# Patient Record
Sex: Female | Born: 1940 | Race: Black or African American | Hispanic: No
Health system: Southern US, Academic
[De-identification: ages and names within clinical notes are randomized; demographics above are authoritative.]

## PROBLEM LIST (undated history)

## (undated) ENCOUNTER — Encounter

## (undated) ENCOUNTER — Telehealth

## (undated) ENCOUNTER — Ambulatory Visit: Payer: MEDICARE

## (undated) ENCOUNTER — Ambulatory Visit

## (undated) ENCOUNTER — Encounter: Attending: Community Health | Primary: Community Health

## (undated) ENCOUNTER — Telehealth: Attending: Clinical | Primary: Clinical

## (undated) ENCOUNTER — Ambulatory Visit: Attending: Audiologist | Primary: Audiologist

## (undated) ENCOUNTER — Ambulatory Visit: Payer: MEDICARE | Attending: Optometrist | Primary: Optometrist

## (undated) ENCOUNTER — Other Ambulatory Visit

## (undated) ENCOUNTER — Ambulatory Visit
Payer: MEDICARE | Attending: Student in an Organized Health Care Education/Training Program | Primary: Student in an Organized Health Care Education/Training Program

## (undated) ENCOUNTER — Ambulatory Visit: Payer: MEDICARE | Attending: Professional | Primary: Professional

## (undated) ENCOUNTER — Encounter
Attending: Student in an Organized Health Care Education/Training Program | Primary: Student in an Organized Health Care Education/Training Program

## (undated) ENCOUNTER — Ambulatory Visit: Payer: MEDICARE | Attending: Ambulatory Care | Primary: Ambulatory Care

## (undated) ENCOUNTER — Other Ambulatory Visit
Attending: Pharmacist Clinician (PhC)/ Clinical Pharmacy Specialist | Primary: Pharmacist Clinician (PhC)/ Clinical Pharmacy Specialist

## (undated) ENCOUNTER — Inpatient Hospital Stay

## (undated) ENCOUNTER — Encounter: Attending: Adult Health | Primary: Adult Health

## (undated) ENCOUNTER — Encounter: Attending: Clinical | Primary: Clinical

## (undated) ENCOUNTER — Telehealth: Attending: Social Worker | Primary: Social Worker

## (undated) ENCOUNTER — Ambulatory Visit: Payer: MEDICARE | Attending: Community Health | Primary: Community Health

## (undated) ENCOUNTER — Ambulatory Visit: Payer: MEDICARE | Attending: Nephrology | Primary: Nephrology

## (undated) ENCOUNTER — Ambulatory Visit: Payer: MEDICARE | Attending: Physical Medicine & Rehabilitation | Primary: Physical Medicine & Rehabilitation

## (undated) ENCOUNTER — Telehealth: Attending: Nurse Practitioner | Primary: Nurse Practitioner

## (undated) ENCOUNTER — Telehealth
Attending: Student in an Organized Health Care Education/Training Program | Primary: Student in an Organized Health Care Education/Training Program

## (undated) ENCOUNTER — Ambulatory Visit: Payer: MEDICARE | Attending: Pharmacist | Primary: Pharmacist

## (undated) ENCOUNTER — Ambulatory Visit: Attending: Clinical | Primary: Clinical

## (undated) ENCOUNTER — Encounter: Attending: Family Medicine | Primary: Family Medicine

## (undated) ENCOUNTER — Ambulatory Visit: Payer: MEDICARE | Attending: Family | Primary: Family

## (undated) ENCOUNTER — Ambulatory Visit: Attending: Pharmacist | Primary: Pharmacist

## (undated) ENCOUNTER — Ambulatory Visit: Payer: MEDICARE | Attending: Clinical | Primary: Clinical

## (undated) ENCOUNTER — Encounter: Attending: Psychiatry | Primary: Psychiatry

## (undated) ENCOUNTER — Ambulatory Visit: Payer: MEDICARE | Attending: Neurological Surgery | Primary: Neurological Surgery

## (undated) ENCOUNTER — Ambulatory Visit: Payer: MEDICARE | Attending: Adult Health | Primary: Adult Health

## (undated) ENCOUNTER — Ambulatory Visit: Payer: MEDICARE | Attending: Internal Medicine | Primary: Internal Medicine

## (undated) DIAGNOSIS — I10 Essential (primary) hypertension: Secondary | ICD-10-CM

## (undated) DIAGNOSIS — E119 Type 2 diabetes mellitus without complications: Secondary | ICD-10-CM

## (undated) DIAGNOSIS — C679 Malignant neoplasm of bladder, unspecified: Secondary | ICD-10-CM

## (undated) HISTORY — PX: ABDOMINAL HYSTERECTOMY: SHX81

## (undated) HISTORY — PX: CYSTECTOMY W/ CONTINENT DIVERSION: SUR360

## (undated) HISTORY — PX: URETEROSTOMY: SHX495

## (undated) HISTORY — PX: CHOLECYSTECTOMY: SHX55

---
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
## 2008-04-05 ENCOUNTER — Ambulatory Visit: Payer: Self-pay | Admitting: Gastroenterology

## 2008-08-18 ENCOUNTER — Ambulatory Visit: Payer: Self-pay | Admitting: Surgery

## 2008-08-27 ENCOUNTER — Ambulatory Visit: Payer: Self-pay | Admitting: Surgery

## 2008-09-07 ENCOUNTER — Other Ambulatory Visit: Payer: Self-pay

## 2008-09-07 ENCOUNTER — Emergency Department: Payer: Self-pay | Admitting: Emergency Medicine

## 2011-03-01 ENCOUNTER — Emergency Department: Payer: Self-pay | Admitting: Emergency Medicine

## 2015-05-04 ENCOUNTER — Observation Stay
Admission: EM | Admit: 2015-05-04 | Discharge: 2015-05-08 | Disposition: A | Discharge status: Home / Self Care | Payer: Medicare Other | Attending: Internal Medicine | Admitting: Internal Medicine

## 2015-05-04 DIAGNOSIS — N390 Urinary tract infection, site not specified: Principal | ICD-10-CM | POA: Diagnosis present

## 2015-05-04 DIAGNOSIS — Z79899 Other long term (current) drug therapy: Secondary | ICD-10-CM | POA: Diagnosis not present

## 2015-05-04 DIAGNOSIS — J449 Chronic obstructive pulmonary disease, unspecified: Secondary | ICD-10-CM | POA: Diagnosis not present

## 2015-05-04 DIAGNOSIS — R1030 Lower abdominal pain, unspecified: Secondary | ICD-10-CM | POA: Insufficient documentation

## 2015-05-04 DIAGNOSIS — E1149 Type 2 diabetes mellitus with other diabetic neurological complication: Secondary | ICD-10-CM

## 2015-05-04 DIAGNOSIS — Z8551 Personal history of malignant neoplasm of bladder: Secondary | ICD-10-CM | POA: Insufficient documentation

## 2015-05-04 DIAGNOSIS — Z9889 Other specified postprocedural states: Secondary | ICD-10-CM | POA: Insufficient documentation

## 2015-05-04 DIAGNOSIS — E1340 Other specified diabetes mellitus with diabetic neuropathy, unspecified: Secondary | ICD-10-CM | POA: Insufficient documentation

## 2015-05-04 DIAGNOSIS — R4781 Slurred speech: Secondary | ICD-10-CM

## 2015-05-04 DIAGNOSIS — R4182 Altered mental status, unspecified: Secondary | ICD-10-CM | POA: Insufficient documentation

## 2015-05-04 DIAGNOSIS — R42 Dizziness and giddiness: Secondary | ICD-10-CM

## 2015-05-04 DIAGNOSIS — F17200 Nicotine dependence, unspecified, uncomplicated: Secondary | ICD-10-CM | POA: Insufficient documentation

## 2015-05-04 DIAGNOSIS — Z888 Allergy status to other drugs, medicaments and biological substances status: Secondary | ICD-10-CM | POA: Insufficient documentation

## 2015-05-04 DIAGNOSIS — I10 Essential (primary) hypertension: Secondary | ICD-10-CM

## 2015-05-04 DIAGNOSIS — R0602 Shortness of breath: Secondary | ICD-10-CM | POA: Diagnosis not present

## 2015-05-04 DIAGNOSIS — I6523 Occlusion and stenosis of bilateral carotid arteries: Secondary | ICD-10-CM | POA: Diagnosis not present

## 2015-05-04 DIAGNOSIS — Z7982 Long term (current) use of aspirin: Secondary | ICD-10-CM | POA: Insufficient documentation

## 2015-05-04 DIAGNOSIS — R112 Nausea with vomiting, unspecified: Secondary | ICD-10-CM | POA: Diagnosis not present

## 2015-05-04 DIAGNOSIS — F333 Major depressive disorder, recurrent, severe with psychotic symptoms: Secondary | ICD-10-CM

## 2015-05-04 DIAGNOSIS — R270 Ataxia, unspecified: Secondary | ICD-10-CM

## 2015-05-04 DIAGNOSIS — R0902 Hypoxemia: Secondary | ICD-10-CM

## 2015-05-04 DIAGNOSIS — F259 Schizoaffective disorder, unspecified: Secondary | ICD-10-CM | POA: Diagnosis present

## 2015-05-04 HISTORY — DX: Malignant neoplasm of bladder, unspecified: C67.9

## 2015-05-04 LAB — BASIC METABOLIC PANEL
Anion gap: 10 (ref 5–15)
BUN: 25 mg/dL — AB (ref 6–20)
CHLORIDE: 105 mmol/L (ref 101–111)
CO2: 24 mmol/L (ref 22–32)
Calcium: 9.2 mg/dL (ref 8.9–10.3)
Creatinine, Ser: 0.98 mg/dL (ref 0.44–1.00)
GFR calc Af Amer: >60 mL/min (ref >60)
GFR calc non Af Amer: 56 mL/min — ABNORMAL LOW (ref >60)
Glucose, Bld: 169 mg/dL — ABNORMAL HIGH (ref 65–99)
Potassium: 3 mmol/L — ABNORMAL LOW (ref 3.5–5.1)
SODIUM: 139 mmol/L (ref 135–145)

## 2015-05-04 LAB — CBC
HEMATOCRIT: 43.3 % (ref 35.0–47.0)
Hemoglobin: 14 g/dL (ref 12.0–16.0)
MCH: 29.3 pg (ref 26.0–34.0)
MCHC: 32.3 g/dL (ref 32.0–36.0)
MCV: 90.6 fL (ref 80.0–100.0)
PLATELETS: 239 10*3/uL (ref 150–440)
RBC: 4.78 MIL/uL (ref 3.80–5.20)
RDW: 16.3 % — ABNORMAL HIGH (ref 11.5–14.5)
WBC: 9.2 10*3/uL (ref 3.6–11.0)

## 2015-05-04 LAB — URINALYSIS COMPLETE WITH MICROSCOPIC (ARMC ONLY)
Bilirubin Urine: NEGATIVE
Glucose, UA: NEGATIVE mg/dL
Ketones, ur: NEGATIVE mg/dL
NITRITE: NEGATIVE
PH: 7 (ref 5.0–8.0)
PROTEIN: 30 mg/dL — AB
SPECIFIC GRAVITY, URINE: 1.011 (ref 1.005–1.030)

## 2015-05-04 LAB — GLUCOSE, CAPILLARY: Glucose-Capillary: 156 mg/dL — ABNORMAL HIGH (ref 70–99)

## 2015-05-04 MED ORDER — ONDANSETRON HCL 4 MG/2ML IJ SOLN
4.0000 mg | Freq: Once | INTRAMUSCULAR | Status: AC
  Administered 2015-05-04: 4 mg via INTRAVENOUS

## 2015-05-04 MED ORDER — ONDANSETRON HCL 4 MG/2ML IJ SOLN
INTRAMUSCULAR | Status: AC
  Administered 2015-05-04: 4 mg via INTRAVENOUS
  Filled 2015-05-04: qty 2

## 2015-05-04 NOTE — ED Notes (Signed)
Pt to ED via EMS transport from home, EMS reported pt had sudden onset of dizziness with n,v and diaphoresis, pt denies any pain, EMS gave pt 4 mg of IV zofran in route to hospital, no vomiting at present

## 2015-05-04 NOTE — ED Notes (Signed)
   05/04/15 2245  Cardiac  Cardiac (WDL) X  Cardiac Regularity Regular  Cardiac Rhythm SB  Sinus Loletha Grayer

## 2015-05-04 NOTE — ED Notes (Signed)
   05/04/15 2245  N/V/D  Nausea/Vomiting Both  Pt c/o nausea with vomiting x 2 days.

## 2015-05-05 ENCOUNTER — Encounter: Payer: Self-pay | Admitting: Internal Medicine

## 2015-05-05 ENCOUNTER — Emergency Department: Payer: Medicare Other

## 2015-05-05 DIAGNOSIS — E1149 Type 2 diabetes mellitus with other diabetic neurological complication: Secondary | ICD-10-CM

## 2015-05-05 DIAGNOSIS — N390 Urinary tract infection, site not specified: Secondary | ICD-10-CM | POA: Diagnosis present

## 2015-05-05 DIAGNOSIS — I10 Essential (primary) hypertension: Secondary | ICD-10-CM

## 2015-05-05 LAB — CBC
HCT: 42.5 % (ref 35.0–47.0)
Hemoglobin: 14.1 g/dL (ref 12.0–16.0)
MCH: 29.8 pg (ref 26.0–34.0)
MCHC: 33.2 g/dL (ref 32.0–36.0)
MCV: 89.5 fL (ref 80.0–100.0)
Platelets: 258 10*3/uL (ref 150–440)
RBC: 4.74 MIL/uL (ref 3.80–5.20)
RDW: 15.8 % — ABNORMAL HIGH (ref 11.5–14.5)
WBC: 8 10*3/uL (ref 3.6–11.0)

## 2015-05-05 LAB — BASIC METABOLIC PANEL
Anion gap: 10 (ref 5–15)
BUN: 24 mg/dL — ABNORMAL HIGH (ref 6–20)
CO2: 25 mmol/L (ref 22–32)
Calcium: 9.6 mg/dL (ref 8.9–10.3)
Chloride: 105 mmol/L (ref 101–111)
Creatinine, Ser: 0.85 mg/dL (ref 0.44–1.00)
GFR calc Af Amer: >60 mL/min (ref >60)
GFR calc non Af Amer: >60 mL/min (ref >60)
Glucose, Bld: 145 mg/dL — ABNORMAL HIGH (ref 65–99)
Potassium: 3.4 mmol/L — ABNORMAL LOW (ref 3.5–5.1)
Sodium: 140 mmol/L (ref 135–145)

## 2015-05-05 LAB — HEMOGLOBIN A1C: HEMOGLOBIN A1C: 6.1 % — AB (ref 4.0–6.0)

## 2015-05-05 LAB — GLUCOSE, CAPILLARY
Glucose-Capillary: 115 mg/dL — ABNORMAL HIGH (ref 70–99)
Glucose-Capillary: 153 mg/dL — ABNORMAL HIGH (ref 70–99)

## 2015-05-05 LAB — TROPONIN I: Troponin I: <0.03 ng/mL (ref <0.031)

## 2015-05-05 MED ORDER — FUROSEMIDE 20 MG PO TABS
20.0000 mg | ORAL_TABLET | Freq: Every day | ORAL | Status: DC
  Administered 2015-05-05 – 2015-05-08 (×4): 20 mg via ORAL
  Filled 2015-05-05 (×4): qty 1

## 2015-05-05 MED ORDER — ONDANSETRON HCL 4 MG/2ML IJ SOLN
INTRAMUSCULAR | Status: AC
  Administered 2015-05-05: 4 mg via INTRAVENOUS
  Filled 2015-05-05: qty 2

## 2015-05-05 MED ORDER — ONDANSETRON HCL 4 MG/2ML IJ SOLN
4.0000 mg | Freq: Once | INTRAMUSCULAR | Status: AC
  Administered 2015-05-05: 4 mg via INTRAVENOUS

## 2015-05-05 MED ORDER — PNEUMOCOCCAL VAC POLYVALENT 25 MCG/0.5ML IJ INJ
0.5000 mL | INJECTION | INTRAMUSCULAR | Status: DC
  Filled 2015-05-05: qty 0.5

## 2015-05-05 MED ORDER — ONDANSETRON HCL 4 MG/2ML IJ SOLN
4.0000 mg | Freq: Four times a day (QID) | INTRAMUSCULAR | Status: DC | PRN
  Administered 2015-05-05: 4 mg via INTRAVENOUS
  Filled 2015-05-05 (×3): qty 2

## 2015-05-05 MED ORDER — MECLIZINE HCL 25 MG PO TABS
25.0000 mg | ORAL_TABLET | Freq: Once | ORAL | Status: AC
  Administered 2015-05-05: 25 mg via ORAL

## 2015-05-05 MED ORDER — MECLIZINE HCL 25 MG PO TABS
12.5000 mg | ORAL_TABLET | Freq: Two times a day (BID) | ORAL | Status: DC | PRN
  Administered 2015-05-05 – 2015-05-07 (×4): 12.5 mg via ORAL
  Filled 2015-05-05 (×4): qty 1

## 2015-05-05 MED ORDER — DEXTROSE 5 % IV SOLN
1.0000 g | INTRAVENOUS | Status: DC
  Administered 2015-05-06 – 2015-05-08 (×3): 1 g via INTRAVENOUS
  Filled 2015-05-05 (×4): qty 10

## 2015-05-05 MED ORDER — ADULT MULTIVITAMIN W/MINERALS CH
1.0000 | ORAL_TABLET | Freq: Every day | ORAL | Status: DC
  Administered 2015-05-05 – 2015-05-08 (×4): 1 via ORAL
  Filled 2015-05-05 (×6): qty 1

## 2015-05-05 MED ORDER — ACETAMINOPHEN 325 MG PO TABS
650.0000 mg | ORAL_TABLET | Freq: Four times a day (QID) | ORAL | Status: DC | PRN
  Administered 2015-05-05 – 2015-05-08 (×9): 650 mg via ORAL
  Filled 2015-05-05 (×12): qty 2

## 2015-05-05 MED ORDER — ASPIRIN EC 81 MG PO TBEC
81.0000 mg | DELAYED_RELEASE_TABLET | Freq: Every day | ORAL | Status: DC
  Administered 2015-05-05 – 2015-05-08 (×4): 81 mg via ORAL
  Filled 2015-05-05 (×4): qty 1

## 2015-05-05 MED ORDER — AMLODIPINE BESYLATE 10 MG PO TABS
10.0000 mg | ORAL_TABLET | Freq: Every day | ORAL | Status: DC
  Administered 2015-05-05 – 2015-05-08 (×4): 10 mg via ORAL
  Filled 2015-05-05 (×4): qty 1

## 2015-05-05 MED ORDER — HEPARIN SODIUM (PORCINE) 5000 UNIT/ML IJ SOLN
5000.0000 [IU] | Freq: Three times a day (TID) | INTRAMUSCULAR | Status: DC
  Administered 2015-05-05 – 2015-05-08 (×11): 5000 [IU] via SUBCUTANEOUS
  Filled 2015-05-05 (×11): qty 1

## 2015-05-05 MED ORDER — MECLIZINE HCL 25 MG PO TABS
ORAL_TABLET | ORAL | Status: AC
  Administered 2015-05-05: 25 mg via ORAL
  Filled 2015-05-05: qty 1

## 2015-05-05 MED ORDER — DEXTROSE 5 % IV SOLN
1.0000 g | Freq: Once | INTRAVENOUS | Status: AC
  Administered 2015-05-05: 1 g via INTRAVENOUS
  Filled 2015-05-05: qty 10

## 2015-05-05 MED ORDER — INSULIN ASPART 100 UNIT/ML ~~LOC~~ SOLN
0.0000 [IU] | Freq: Three times a day (TID) | SUBCUTANEOUS | Status: DC
  Administered 2015-05-06 – 2015-05-07 (×2): 1 [IU] via SUBCUTANEOUS
  Administered 2015-05-08: 3 [IU] via SUBCUTANEOUS
  Filled 2015-05-05 (×2): qty 1
  Filled 2015-05-05: qty 3

## 2015-05-05 MED ORDER — DULOXETINE HCL 30 MG PO CPEP
30.0000 mg | ORAL_CAPSULE | Freq: Every day | ORAL | Status: DC
  Administered 2015-05-05 – 2015-05-08 (×4): 30 mg via ORAL
  Filled 2015-05-05 (×4): qty 1

## 2015-05-05 NOTE — ED Notes (Signed)
Patient transported to CT 

## 2015-05-05 NOTE — ED Provider Notes (Signed)
[facility] Emergency Department Provider Note    ____________________________________________  Time seen: 00:30  I have reviewed the triage vital signs and the nursing notes.   HISTORY  Chief Complaint Dizziness; Nausea; and Emesis       HPI Brittany Hernandez is a 74 y.o. female presents with acute onset of dizziness and generalized weakness with onset at 9:30 PM. Patient states that dizziness is worse with positional change as well as head movement. Patient denies headache.     No past medical history on file.  There are no active problems to display for this patient.   No past surgical history on file.  No current outpatient prescriptions on file.  Allergies Lisinopril  No family history on file.  Social History History  Substance Use Topics  . Smoking status: Not on file  . Smokeless tobacco: Not on file  . Alcohol Use: Not on file    Review of Systems  Constitutional: Negative for fever. Eyes: Negative for visual changes. ENT: Negative for sore throat. Cardiovascular: Negative for chest pain. Respiratory: Negative for shortness of breath. Gastrointestinal: Negative for abdominal pain, vomiting and diarrhea. Genitourinary: mucus noted. Musculoskeletal: Negative for back pain. Skin: Negative for rash. Neurological: Negative for headaches, focal weakness or numbness.  10-point ROS otherwise negative.  ____________________________________________   PHYSICAL EXAM:  VITAL SIGNS: ED Triage Vitals  Enc Vitals Group     BP 05/04/15 2238 135/80 mmHg     Pulse Rate 05/04/15 2238 56     Resp 05/04/15 2238 16     Temp 05/04/15 2238 96 F (35.6 C)     Temp Source 05/04/15 2238 Rectal     SpO2 05/04/15 2238 92 %     Weight 05/04/15 2238 145 lb (65.772 kg)     Height 05/04/15 2238 5\' 5"  (1.651 m)     Head Cir --      Peak Flow --      Pain Score --      Pain Loc --      Pain Edu? --      Excl. in [place]? --       Constitutional: Alert and oriented. Well appearing and in no distress. Eyes: Conjunctivae are normal. PERRL. Normal extraocular movements. ENT   Head: Normocephalic and atraumatic.   Nose: No congestion/rhinnorhea.   Mouth/Throat: Mucous membranes are moist.   Neck: No stridor. Hematological/Lymphatic/Immunilogical: No cervical lymphadenopathy. Cardiovascular: Normal rate, regular rhythm. Normal and symmetric distal pulses are present in all extremities. No murmurs, rubs, or gallops. Respiratory: Normal respiratory effort without tachypnea nor retractions. Breath sounds are clear and equal bilaterally. No wheezes/rales/rhonchi. Gastrointestinal: Soft and nontender. No distention. No abdominal bruits. There is no CVA tenderness. Genitourinary: deferred Musculoskeletal: Nontender with normal range of motion in all extremities. No joint effusions.  No lower extremity tenderness nor edema. Neurologic:  Normal speech and language. No gross focal neurologic deficits are appreciated. Speech is normal. No gait instability. Skin:  Skin is warm, dry and intact. No rash noted. Psychiatric: Mood and affect are normal. Speech and behavior are normal. Patient exhibits appropriate insight and judgment.  ____________________________________________    LABS (pertinent positives/negatives)  Urinalysis revealed positive UTI.  ____________________________________________   EKG  Sinus bradycardia rate of 55 normal ST segment normal axis  ____________________________________________    RADIOLOGY   CT scan of the head revealed normal head. ____________________________________________   PROCEDURES     ____________________________________________   INITIAL IMPRESSION / ASSESSMENT AND PLAN / ED  COURSE  Pertinent labs & imaging results that were available during my care of the patient were reviewed by me and considered in my medical decision making (see chart for  details).  Patient received multiple doses of Zofran IV for nausea control. With improvement. Patient recently received meclizine 25 mg tablet for vertigo improvement. Given bradycardia and unsteady gait and vertiginous symptoms as well in conjunction with urinary tract infection patient will be admitted for IV antibiotics. Regarding bradycardia cardiology consultation  ____________________________________________   FINAL CLINICAL IMPRESSION(S) / ED DIAGNOSES  Final diagnoses:  Urinary tract infection, acute     Gregor Hams, MD 05/05/15 [phone]

## 2015-05-05 NOTE — ED Notes (Signed)
Consult at bedside.

## 2015-05-05 NOTE — Progress Notes (Signed)
Order for Zofran 4mg  q 6 hrs as needed prn per Dr. Molli Hazard.

## 2015-05-05 NOTE — ED Notes (Signed)
Report called to Kierra, RN  

## 2015-05-05 NOTE — ED Notes (Signed)
Pt returned from CT. NAD noted.

## 2015-05-05 NOTE — ED Notes (Addendum)
   05/04/15 2300  Abdominal  Gastrointestinal (WDL) X  Pt has urostomy bag in right abdomen with bag in place, urine noted in bag.

## 2015-05-05 NOTE — Progress Notes (Addendum)
[place] [facility] [place] NAME: Brittany Hernandez    MR#:  [id number]  DATE OF BIRTH:  November 12, 1941  SUBJECTIVE:  CHIEF COMPLAINT:   Chief Complaint  Patient presents with  . Dizziness  . Nausea  . Emesis   Pt states she is still dizzy, though less so with the meclizine.  Zofran has also helped with nausea.  Now has some R CVA aching dull pain.  No other complaints.  REVIEW OF SYSTEMS:  CONSTITUTIONAL: Sweats at home prior to admission with fatigue and weakness, no weight changes.  EYES: No blurred or double vision, no pain, no redness EARS, NOSE, THROAT: No ear pain, no hearing loss, no discharge, no difficulty swallowing RESPIRATORY: Cough with mild productive sputum, no wheeze, no hemoptysis, no dyspnea CARDIOVASCULAR: No chest pain, no orthopnea, no edema, no palpitations, no syncope GASTROINTESTINAL: No nausea/vomiting/diarrhea, no abdominal pain, no change in bowel habits GENITOURINARY: pt has diverting uereterostomy ENDOCRINE: No thyroid problems, no heat or cold intolerance, no thirst HEMATOLOGIC/LYMPHATIC: No anemia, no easy bruising bleeding, no swollen glands INTEGUMENTARY: No acne, no rash, no lesions, no change in mole-hair-skin  MUSCULOSKELETAL: Right CVA pain, no acute arthritis, no joint swelling, no gout NEUROLOGICAL: Dizziness, no numbness, no weakness, no dysarthria PSYCHIATRIC: No anxiety, no insomnia, no mania, no depression   DRUG ALLERGIES:   Allergies  Allergen Reactions  . Lisinopril Swelling    VITALS:   Filed Vitals:   05/05/15 0400 05/05/15 0439 05/05/15 0500 05/05/15 0724  BP: 139/90 153/75  156/70  Pulse: 74 73  78  Temp: 98 F (36.7 C) 98.3 F (36.8 C)  98 F (36.7 C)  TempSrc: Oral Oral  Oral  Resp: 18 18  16   Height:   5\' 5"  (1.651 m)   Weight:   64.229 kg (141 lb 9.6 oz)   SpO2: 96% 96%  96%   Wt Readings from Last 3 Encounters:  05/05/15 64.229 kg (141 lb 9.6 oz)    PHYSICAL  EXAMINATION:  General:  Well nourished, in no apparent distress HEENT: PERRL, EOMI, Mucosa - moist, no scleral icterus, conjunctivitis, or difficulty hearing Neck: supple, no masses, non tender, no cervical adenopathy, no JVD, thyroid not enlarged Cardiovascular: RRR, no m/r/g, no S3/S4, good pedal pulses, no LE edema. Respiratory: Mild coarse bronchial breath sounds without rales or wheeze, breath sounds not diminished, no increased respiratory effort Abdomen: soft, nontender, nondistended, no mass, bowel sounds present, ureterostomy intact without surrounding erythema Musculoskeletal: 5/5 muscular strength x4 extremities, full spontaneous range of motion throughout, no cyanosis/clubbing Skin: no rash, no lesions, no erythema, warm and dry  Lymphatic: No adenopathy Neurologic: Cranial nerves intact, sensation intact, no dysarthria, no aphasia Psychiatric: Alert, Oriented to - time, person, place, circumstance, patient is cooperative, not confused, not agitated, not depressed    LABORATORY PANEL:   CBC  Recent Labs Lab 05/05/15 0526  WBC 8.0  HGB 14.1  HCT 42.5  PLT 258   ------------------------------------------------------------------------------------------------------------------  Chemistries   Recent Labs Lab 05/05/15 0526  NA 140  K 3.4*  CL 105  CO2 25  GLUCOSE 145*  BUN 24*  CREATININE 0.85  CALCIUM 9.6   ------------------------------------------------------------------------------------------------------------------  Cardiac Enzymes  Recent Labs Lab 05/04/15 2250  TROPONINI <0.03   ------------------------------------------------------------------------------------------------------------------  RADIOLOGY:  Ct Head Wo Contrast  05/05/2015   CLINICAL DATA:  Acute onset dizziness with nausea, vomiting and diaphoresis. History of urostomy, chemotherapy and surgery.  EXAM: CT HEAD WITHOUT CONTRAST  TECHNIQUE:  Contiguous axial images were obtained from the  base of the skull through the vertex without intravenous contrast.  COMPARISON:  None.  FINDINGS: The ventricles and sulci are normal for age. No intraparenchymal hemorrhage, mass effect nor midline shift. Patchy supratentorial white matter hypodensities are less than expected for patient's age and though non-specific suggest sequelae of chronic small vessel ischemic disease. No acute large vascular territory infarcts.  No abnormal extra-axial fluid collections. Basal cisterns are patent. Mild calcific atherosclerosis of the carotid siphons.  No skull fracture. The included ocular globes and orbital contents are non-suspicious. Minimal LEFT mastoid effusion. Imaged paranasal sinuses are well-aerated. Patient is edentulous.  IMPRESSION: No acute intracranial process ; normal noncontrast CT of the head for age.   Electronically Signed   By: Elon Alas   On: 05/05/2015 01:33    EKG:   Orders placed or performed during the hospital encounter of 05/04/15  . ED EKG  . ED EKG  . EKG 12-Lead  . EKG 12-Lead    ASSESSMENT AND PLAN:  Principal Problem:   UTI (lower urinary tract infection) Active Problems:   HTN (hypertension)   DM (diabetes mellitus), type 2 with neurological complications  1. Urinary tract infection: Got IV rocephin in ED.  Continue this for now as R CVA pain is possibly indicative of pyelonephritis. 2. Diabetes mellitus: This is reportedly diet controlled. Follow up on hemoglobin A1c and add sliding scale insulin if needed. 3. Neuropathy: Continue Cymbalta 4. Hypertension: continue home Norvasc and lasix 5. DVT prophylaxis: Heparin 6. GI prophylaxis: none    All the records are reviewed and case discussed with Care Management/Social Worker. Management plans discussed with the patient and/or family.  CODE STATUS: Full  TOTAL TIME TAKING CARE OF THIS PATIENT: 35 minutes.   POSSIBLE D/C IN 1-2 DAYS, DEPENDING ON CLINICAL CONDITION.    Pierina Schuknecht  FIELDING 05/05/2015, 12:23 PM  [facility]  Office  [phone]  CC: Primary care physician; Chester Holstein, MD

## 2015-05-05 NOTE — Progress Notes (Signed)
Order for FSBS for Room 212 as well as medium sliding scale. Orders per Dr. Vianne Bulls.

## 2015-05-05 NOTE — H&P (Signed)
Brittany Hernandez is an 74 y.o. female.   Chief Complaint: Dizziness HPI: The patient presents to the emergency department complaining of dizziness especially when she moves her head. She complains of fevers and chills as well as nausea vomiting and diarrhea for a few days. Her emesis has been nonbloody and nonbilious. The patient's past medical history is significant for cystectomy and ureterostomy. A urine sample was obtained from the bag which showed too numerous to count bacteria without evidence of contamination which prompted the emergency department call for admission.  Past Medical History  Diagnosis Date  . Bladder cancer     s/p cystectomy    Past Surgical History  Procedure Laterality Date  . Cystectomy w/ continent diversion Right   . Cholecystectomy    . Abdominal hysterectomy    . Ureterostomy Right     Family History  Problem Relation Age of Onset  . Diabetes Mellitus II Other   . CAD Other    Social History:  reports that she has been smoking Cigarettes.  She has a 50 pack-year smoking history. She does not have any smokeless tobacco history on file. She reports that she does not drink alcohol or use illicit drugs.  Allergies:  Allergies  Allergen Reactions  . Lisinopril Swelling    Medications Prior to Admission  Medication Sig Dispense Refill  . amLODipine (NORVASC) 5 MG tablet Take 10 mg by mouth daily. Takes 2 76m tabs    . aspirin EC 81 MG tablet Take 81 mg by mouth daily.    . DULoxetine (CYMBALTA) 30 MG capsule Take 30 mg by mouth daily.    . furosemide (LASIX) 20 MG tablet Take 20 mg by mouth daily.      Results for orders placed or performed during the hospital encounter of 05/04/15 (from the past 48 hour(s))  Glucose, capillary     Status: Abnormal   Collection Time: 05/04/15 10:42 PM  Result Value Ref Range   Glucose-Capillary 156 (H) 70 - 99 mg/dL  CBC     Status: Abnormal   Collection Time: 05/04/15 10:49 PM  Result Value Ref Range   WBC 9.2  3.6 - 11.0 K/uL   RBC 4.78 3.80 - 5.20 MIL/uL   Hemoglobin 14.0 12.0 - 16.0 g/dL   HCT 43.3 35.0 - 47.0 %   MCV 90.6 80.0 - 100.0 fL   MCH 29.3 26.0 - 34.0 pg   MCHC 32.3 32.0 - 36.0 g/dL   RDW 16.3 (H) 11.5 - 14.5 %   Platelets 239 150 - 440 K/uL  Basic metabolic panel     Status: Abnormal   Collection Time: 05/04/15 10:49 PM  Result Value Ref Range   Sodium 139 135 - 145 mmol/L   Potassium 3.0 (L) 3.5 - 5.1 mmol/L   Chloride 105 101 - 111 mmol/L   CO2 24 22 - 32 mmol/L   Glucose, Bld 169 (H) 65 - 99 mg/dL   BUN 25 (H) 6 - 20 mg/dL   Creatinine, Ser 0.98 0.44 - 1.00 mg/dL   Calcium 9.2 8.9 - 10.3 mg/dL   GFR calc non Af Amer 56 (L) >60 mL/min   GFR calc Af Amer >60 >60 mL/min    Comment: (NOTE) The eGFR has been calculated using the CKD EPI equation. This calculation has not been validated in all clinical situations. eGFR's persistently <90 mL/min signify possible Chronic Kidney Disease.    Anion gap 10 5 - 15  Urinalysis complete, with microscopic ([facility]  Status: Abnormal   Collection Time: 05/04/15 10:50 PM  Result Value Ref Range   Color, Urine YELLOW (A) YELLOW   APPearance CLOUDY (A) CLEAR   Glucose, UA NEGATIVE NEGATIVE mg/dL   Bilirubin Urine NEGATIVE NEGATIVE   Ketones, ur NEGATIVE NEGATIVE mg/dL   Specific Gravity, Urine 1.011 1.005 - 1.030   Hgb urine dipstick 1+ (A) NEGATIVE   pH 7.0 5.0 - 8.0   Protein, ur 30 (A) NEGATIVE mg/dL   Nitrite NEGATIVE NEGATIVE   Leukocytes, UA 2+ (A) NEGATIVE   RBC / HPF 6-30 0 - 5 RBC/hpf   WBC, UA TOO NUMEROUS TO COUNT 0 - 5 WBC/hpf   Bacteria, UA MANY (A) NONE SEEN   Squamous Epithelial / LPF 0-5 (A) NONE SEEN   Mucous PRESENT   Troponin I     Status: None   Collection Time: 05/04/15 10:50 PM  Result Value Ref Range   Troponin I <0.03 <0.031 ng/mL    Comment:        NO INDICATION OF MYOCARDIAL INJURY.   CBC     Status: Abnormal   Collection Time: 05/05/15  5:26 AM  Result Value Ref Range   WBC 8.0 3.6 - 11.0  K/uL   RBC 4.74 3.80 - 5.20 MIL/uL   Hemoglobin 14.1 12.0 - 16.0 g/dL   HCT 42.5 35.0 - 47.0 %   MCV 89.5 80.0 - 100.0 fL   MCH 29.8 26.0 - 34.0 pg   MCHC 33.2 32.0 - 36.0 g/dL   RDW 15.8 (H) 11.5 - 14.5 %   Platelets 258 150 - 440 K/uL   Ct Head Wo Contrast  05/05/2015   CLINICAL DATA:  Acute onset dizziness with nausea, vomiting and diaphoresis. History of urostomy, chemotherapy and surgery.  EXAM: CT HEAD WITHOUT CONTRAST  TECHNIQUE: Contiguous axial images were obtained from the base of the skull through the vertex without intravenous contrast.  COMPARISON:  None.  FINDINGS: The ventricles and sulci are normal for age. No intraparenchymal hemorrhage, mass effect nor midline shift. Patchy supratentorial white matter hypodensities are less than expected for patient's age and though non-specific suggest sequelae of chronic small vessel ischemic disease. No acute large vascular territory infarcts.  No abnormal extra-axial fluid collections. Basal cisterns are patent. Mild calcific atherosclerosis of the carotid siphons.  No skull fracture. The included ocular globes and orbital contents are non-suspicious. Minimal LEFT mastoid effusion. Imaged paranasal sinuses are well-aerated. Patient is edentulous.  IMPRESSION: No acute intracranial process ; normal noncontrast CT of the head for age.   Electronically Signed   By: Elon Alas   On: 05/05/2015 01:33    Review of Systems  Constitutional: Positive for fever and chills.  HENT: Negative for sore throat and tinnitus.   Eyes: Negative for blurred vision and redness.  Respiratory: Negative for cough and shortness of breath.   Cardiovascular: Negative for chest pain, palpitations, orthopnea and PND.  Gastrointestinal: Positive for nausea, vomiting and diarrhea. Negative for abdominal pain.       NBNB  Genitourinary: Negative for dysuria, urgency and frequency.  Musculoskeletal: Negative for myalgias and joint pain.  Skin: Negative for rash.        No lesions  Neurological: Positive for dizziness. Negative for speech change, focal weakness and weakness.  Endo/Heme/Allergies: Does not bruise/bleed easily.       No temperature intolerance  Psychiatric/Behavioral: Negative for depression and suicidal ideas.    Blood pressure 153/75, pulse 73, temperature 98.3 F (36.8 C), temperature  source Oral, resp. rate 18, height '5\' 5"'  (1.651 m), weight 65.953 kg (145 lb 6.4 oz), SpO2 96 %. Physical Exam  Vitals reviewed. Constitutional: She is oriented to person, place, and time. She appears well-developed and well-nourished.  HENT:  Head: Normocephalic and atraumatic.  Eyes: EOM are normal. Pupils are equal, round, and reactive to light.  Neck: Normal range of motion. No tracheal deviation present. No thyromegaly present.  Cardiovascular: Normal rate, regular rhythm and normal heart sounds.  Exam reveals no gallop and no friction rub.   No murmur heard. Respiratory: Effort normal and breath sounds normal.  GI: Soft. Bowel sounds are normal. She exhibits no distension. There is no tenderness.  R ureterostomy present  Genitourinary: Vagina normal.  Musculoskeletal: Normal range of motion. She exhibits no edema.  Lymphadenopathy:    She has no cervical adenopathy.  Neurological: She is alert and oriented to person, place, and time. No cranial nerve deficit. She exhibits normal muscle tone.  Skin: Skin is warm and dry.  Psychiatric: She has a normal mood and affect. Judgment and thought content normal.     Assessment/Plan This is a 74 year old female admitted for urinary tract infection.  1. Urinary tract infection: The patient does not have signs or symptoms of sepsis at this time. She's been given Rocephin in the emergency department which we will continue until we can transition the patient to oral antibiotics. 2. Diabetes mellitus: This is reportedly diet controlled. We will check a hemoglobin A1c and add sliding scale insulin if  needed. 3. Neuropathy: Continue Cymbalta 4. Hypertension: continue Norvasc 5. DVT prophylaxis: Heparin 6. GI prophylaxis: none Patient is a full code. Time spent on admission was in patient care possibly 45 minutes  Harrie Foreman 05/05/2015, 6:28 AM

## 2015-05-06 ENCOUNTER — Observation Stay: Payer: Medicare Other

## 2015-05-06 DIAGNOSIS — N390 Urinary tract infection, site not specified: Secondary | ICD-10-CM | POA: Diagnosis not present

## 2015-05-06 LAB — GLUCOSE, CAPILLARY
Glucose-Capillary: 121 mg/dL — ABNORMAL HIGH (ref 70–99)
Glucose-Capillary: 96 mg/dL (ref 70–99)
Glucose-Capillary: 123 mg/dL — ABNORMAL HIGH (ref 70–99)
Glucose-Capillary: 157 mg/dL — ABNORMAL HIGH (ref 70–99)

## 2015-05-06 LAB — URINE CULTURE

## 2015-05-06 MED ORDER — TIOTROPIUM BROMIDE MONOHYDRATE 18 MCG IN CAPS
18.0000 ug | ORAL_CAPSULE | Freq: Every day | RESPIRATORY_TRACT | Status: DC
Start: 2015-05-06 — End: 2015-05-08
  Administered 2015-05-07 – 2015-05-08 (×2): 18 ug via RESPIRATORY_TRACT
  Filled 2015-05-06: qty 5

## 2015-05-06 MED ORDER — ALBUTEROL SULFATE (2.5 MG/3ML) 0.083% IN NEBU
2.5000 mg | INHALATION_SOLUTION | Freq: Four times a day (QID) | RESPIRATORY_TRACT | Status: DC
  Administered 2015-05-06 – 2015-05-08 (×8): 2.5 mg via RESPIRATORY_TRACT
  Filled 2015-05-06 (×9): qty 3

## 2015-05-06 NOTE — Progress Notes (Signed)
[facility] [place] NAME: Brittany Hernandez    MR#:  [id number]  DATE OF BIRTH:  30-Apr-1941  SUBJECTIVE:  CHIEF COMPLAINT:   Chief Complaint  Patient presents with  . Dizziness  . Nausea  . Emesis   Seen today, has some cough but her shortness of breath. Does have dizziness but better with meclizine. Right-sided groin pain is improved. Nausea vomiting resolved. Feels like her left ear is stuffy and had ringing in the ears a week ago.  REVIEW OF SYSTEMS:  CONSTITUTIONAL: Sweats at home prior to admission with fatigue and weakness, no weight changes.  EYES: No blurred or double vision, no pain, no redness EARS, NOSE, THROAT: No ear pain, no hearing loss, no discharge, no difficulty swallowing RESPIRATORY: Cough with mild productive sputum, no wheeze, no hemoptysis, no dyspnea CARDIOVASCULAR: No chest pain, no orthopnea, no edema, no palpitations, no syncope GASTROINTESTINAL: No nausea/vomiting/diarrhea, no abdominal pain, no change in bowel habits GENITOURINARY: pt has diverting uereterostomy ENDOCRINE: No thyroid problems, no heat or cold intolerance, no thirst HEMATOLOGIC/LYMPHATIC: No anemia, no easy bruising bleeding, no swollen glands INTEGUMENTARY: No acne, no rash, no lesions, no change in mole-hair-skin  MUSCULOSKELETAL: Right CVA pain, no acute arthritis, no joint swelling, no gout NEUROLOGICAL: Dizziness, no numbness, no weakness, no dysarthria PSYCHIATRIC: No anxiety, no insomnia, no mania, no depression   DRUG ALLERGIES:   Allergies  Allergen Reactions  . Lisinopril Swelling    VITALS:   Filed Vitals:   05/05/15 2348 05/06/15 0301 05/06/15 0501 05/06/15 0802  BP: 159/76  136/79 163/88  Pulse: 71 64 64 71  Temp: 98.5 F (36.9 C)  98.2 F (36.8 C) 97.6 F (36.4 C)  TempSrc: Oral  Oral Axillary  Resp: 16  18 18   Height:      Weight:   68.675 kg (151 lb 6.4 oz)   SpO2: 88% 95% 97% 99%   Wt Readings from Last 3  Encounters:  05/06/15 68.675 kg (151 lb 6.4 oz)    PHYSICAL EXAMINATION:  General:  Well nourished, in no apparent distress HEENT: PERRL, EOMI, Mucosa - moist, no scleral icterus, conjunctivitis, or difficulty hearing Neck: supple, no masses, non tender, no cervical adenopathy, no JVD, thyroid not enlarged Cardiovascular: RRR, no m/r/g, no S3/S4, good pedal pulses, no LE edema. Respiratory: Mild coarse bronchial breath sounds without rales or wheeze, breath sounds not diminished, no increased respiratory effort Abdomen: soft, nontender, nondistended, no mass, bowel sounds present, ureterostomy intact without surrounding erythema Musculoskeletal: 5/5 muscular strength x4 extremities, full spontaneous range of motion throughout, no cyanosis/clubbing Skin: no rash, no lesions, no erythema, warm and dry  Lymphatic: No adenopathy Neurologic: Cranial nerves intact, sensation intact, no dysarthria, no aphasia Psychiatric: Alert, Oriented to - time, person, place, circumstance, patient is cooperative, not confused, not agitated, not depressed    LABORATORY PANEL:   CBC  Recent Labs Lab 05/05/15 0526  WBC 8.0  HGB 14.1  HCT 42.5  PLT 258   ------------------------------------------------------------------------------------------------------------------  Chemistries   Recent Labs Lab 05/05/15 0526  NA 140  K 3.4*  CL 105  CO2 25  GLUCOSE 145*  BUN 24*  CREATININE 0.85  CALCIUM 9.6   ------------------------------------------------------------------------------------------------------------------  Cardiac Enzymes  Recent Labs Lab 05/04/15 2250  TROPONINI <0.03   ------------------------------------------------------------------------------------------------------------------  RADIOLOGY:  Ct Head Wo Contrast  05/05/2015   CLINICAL DATA:  Acute onset dizziness with nausea, vomiting and diaphoresis. History of urostomy, chemotherapy and surgery.  EXAM:  CT HEAD WITHOUT  CONTRAST  TECHNIQUE: Contiguous axial images were obtained from the base of the skull through the vertex without intravenous contrast.  COMPARISON:  None.  FINDINGS: The ventricles and sulci are normal for age. No intraparenchymal hemorrhage, mass effect nor midline shift. Patchy supratentorial white matter hypodensities are less than expected for patient's age and though non-specific suggest sequelae of chronic small vessel ischemic disease. No acute large vascular territory infarcts.  No abnormal extra-axial fluid collections. Basal cisterns are patent. Mild calcific atherosclerosis of the carotid siphons.  No skull fracture. The included ocular globes and orbital contents are non-suspicious. Minimal LEFT mastoid effusion. Imaged paranasal sinuses are well-aerated. Patient is edentulous.  IMPRESSION: No acute intracranial process ; normal noncontrast CT of the head for age.   Electronically Signed   By: Elon Alas   On: 05/05/2015 01:33    EKG:   Orders placed or performed during the hospital encounter of 05/04/15  . ED EKG  . ED EKG  . EKG 12-Lead  . EKG 12-Lead    ASSESSMENT AND PLAN:  Principal Problem:   UTI (lower urinary tract infection) Active Problems:   HTN (hypertension)   DM (diabetes mellitus), type 2 with neurological complications  1. Urinary tract infection: Continue Rocephin, urine cultures are contaminant. Likely discharge antibiotics at discharge.  2. Diabetes mellitus: This is reportedly diet controlled. Follow up on hemoglobin A1c and add sliding scale insulin if needed. 3. Neuropathy: Continue Cymbalta 4. Hypertension: continue home Norvasc and lasix 5. DVT prophylaxis: Heparin 6. GI prophylaxis: none 7. Dizziness, sweating at home. Check carotid ultrasound to evaluate, continue meclizine. 8. COPD continue to smoke, start her  On Spiriva,albuterol    All the records are reviewed and case discussed with Care Management/Social Worker. Management plans  discussed with the patient and/or family.  CODE STATUS: Full  TOTAL TIME TAKING CARE OF THIS PATIENT: 35 minutes.   POSSIBLE D/C IN 1-2 DAYS, DEPENDING ON CLINICAL CONDITION.    Lyrick Lagrand 05/06/2015, 11:30 AM  [facility]  Office  [phone]  CC: Primary care physician; Chester Holstein, MD

## 2015-05-06 NOTE — Plan of Care (Signed)
Problem: Discharge Progression Outcomes Goal: Discharge plan in place and appropriate Outcome: Progressing Pt on cardiac diet. No complaints voiced this shift. Urostomy draining yellow urine.  Sat up in chair in beginning of shift and assisted back to bed

## 2015-05-06 NOTE — Progress Notes (Signed)
Pt requested urostomy dressing change, brought in supplies from home. Pt daughter assisted with dressing change.  Urostomy changed with Azzie Almas., RN.  Pt dressing in place, no leakage noted, draining yellow urine as normal. Continue to assess.

## 2015-05-07 ENCOUNTER — Observation Stay: Payer: Medicare Other

## 2015-05-07 DIAGNOSIS — N390 Urinary tract infection, site not specified: Secondary | ICD-10-CM | POA: Diagnosis not present

## 2015-05-07 LAB — GLUCOSE, CAPILLARY
Glucose-Capillary: 118 mg/dL — ABNORMAL HIGH (ref 70–99)
Glucose-Capillary: 130 mg/dL — ABNORMAL HIGH (ref 70–99)
Glucose-Capillary: 124 mg/dL — ABNORMAL HIGH (ref 70–99)
Glucose-Capillary: 131 mg/dL — ABNORMAL HIGH (ref 70–99)

## 2015-05-07 NOTE — Plan of Care (Signed)
Problem: Discharge Progression Outcomes Goal: Discharge plan in place and appropriate Outcome: Progressing Pt resting quietly. Urostomy draining clear yellow urine.  Encouraged fluid intake.  Tylenol given for headache. Remains on O2 at 2LPm via [place]

## 2015-05-07 NOTE — Progress Notes (Signed)
[facility] [place] NAME: Brittany Hernandez    MR#:  [id number]  DATE OF BIRTH:  1941-02-11  SUBJECTIVE:  CHIEF COMPLAINT:   Chief Complaint  Patient presents with  . Dizziness  . Nausea  . Emesis   No complaints, wants to go home.  Daughter is present and is very concerned about decline in status. She is usually independent for mobility, now needing help. Speech slurred and thick.  REVIEW OF SYSTEMS:  Review of Systems  Constitutional: Negative for fever, chills and malaise/fatigue.  HENT: Negative for hearing loss and sore throat.   Eyes: Negative for blurred vision.  Respiratory: Positive for cough, sputum production, shortness of breath and wheezing. Negative for hemoptysis.   Cardiovascular: Negative for chest pain and leg swelling.  Gastrointestinal: Negative for heartburn, nausea, vomiting, abdominal pain and diarrhea.  Genitourinary: Negative for dysuria.  Musculoskeletal: Negative for myalgias and joint pain.  Skin: Negative for rash.  Neurological: Positive for dizziness, speech change and headaches.  Psychiatric/Behavioral: Positive for memory loss.    DRUG ALLERGIES:   Allergies  Allergen Reactions  . Lisinopril Swelling    VITALS:   Filed Vitals:   05/07/15 0108 05/07/15 0722 05/07/15 0804 05/07/15 1026  BP: 142/84  139/116 140/79  Pulse: 71  76   Temp: 98.2 F (36.8 C)  98 F (36.7 C)   TempSrc: Oral  Oral   Resp: 20  18   Height:      Weight:      SpO2: 95% 98% 94%    Wt Readings from Last 3 Encounters:  05/06/15 68.675 kg (151 lb 6.4 oz)  05/07/15 68.493 kg (151 lb)    PHYSICAL EXAMINATION:  Physical Exam  Constitutional: She is oriented to person, place, and time and well-developed, well-nourished, and in no distress.  HENT:  Head: Normocephalic and atraumatic.  Eyes: EOM are normal. Pupils are equal, round, and reactive to light.  Neck: Normal range of motion. Neck supple. No JVD present.  No thyromegaly present.  Cardiovascular: Normal rate, regular rhythm and normal heart sounds.   No murmur heard. Pulmonary/Chest: Effort normal. She has wheezes.  Abdominal: Soft. Bowel sounds are normal.  Musculoskeletal: Normal range of motion. She exhibits no edema.  Lymphadenopathy:    She has no cervical adenopathy.  Neurological: She is alert and oriented to person, place, and time.  Skin: Skin is warm and dry.     LABORATORY PANEL:   CBC  Recent Labs Lab 05/05/15 0526  WBC 8.0  HGB 14.1  HCT 42.5  PLT 258   ------------------------------------------------------------------------------------------------------------------  Chemistries   Recent Labs Lab 05/05/15 0526  NA 140  K 3.4*  CL 105  CO2 25  GLUCOSE 145*  BUN 24*  CREATININE 0.85  CALCIUM 9.6   ------------------------------------------------------------------------------------------------------------------  Cardiac Enzymes  Recent Labs Lab 05/04/15 2250  TROPONINI <0.03   ------------------------------------------------------------------------------------------------------------------  RADIOLOGY:  US Carotid Bilateral  05/06/2015   CLINICAL DATA:  Dizziness, hypertension, diabetes and tobacco use.  EXAM: BILATERAL CAROTID DUPLEX ULTRASOUND  TECHNIQUE: Pearline Cables scale imaging, color Doppler and duplex ultrasound were performed of bilateral carotid and vertebral arteries in the neck.  COMPARISON:  None.  FINDINGS: Criteria: Quantification of carotid stenosis is based on velocity parameters that correlate the residual internal carotid diameter with NASCET-based stenosis levels, using the diameter of the distal internal carotid lumen as the denominator for stenosis measurement.  The following velocity measurements were obtained:  RIGHT  ICA:  100/24 cm/sec  CCA:  86/16 cm/sec  SYSTOLIC ICA/CCA RATIO:  2.3  DIASTOLIC ICA/CCA RATIO:  2.4  ECA:  39 cm/sec  LEFT  ICA:  63/20 cm/sec  CCA:  83/72 cm/sec  SYSTOLIC  ICA/CCA RATIO:  1.0  DIASTOLIC ICA/CCA RATIO:  1.6  ECA:  58 cm/sec  RIGHT CAROTID ARTERY: There is a minimal amount of partially calcified plaque at the level of the distal bulb and proximal ICA. Velocities and waveforms are normal. Estimated right ICA stenosis is less than 50%. The internal carotid artery is tortuous.  RIGHT VERTEBRAL ARTERY: Antegrade flow with normal waveform and velocity.  LEFT CAROTID ARTERY: There is a mild amount of predominately calcified plaque at the level of the distal bulb and proximal ICA. Velocities and waveforms are unremarkable and estimated left ICA stenosis is less than 50%. The internal carotid artery is tortuous in the neck.  LEFT VERTEBRAL ARTERY: Antegrade flow with normal waveform and velocity.  IMPRESSION: Mild atherosclerotic plaque at the level of both carotid bulbs and proximal internal carotid arteries, left greater than right. No significant carotid stenosis is identified with estimated bilateral ICA stenoses of less than 50%.   Electronically Signed   By: Aletta Edouard M.D.   On: 05/06/2015 14:51    EKG:   Orders placed or performed during the hospital encounter of 05/04/15  . ED EKG  . ED EKG  . EKG 12-Lead  . EKG 12-Lead    ASSESSMENT AND PLAN:  Principal Problem:   UTI (lower urinary tract infection) Active Problems:   HTN (hypertension)   DM (diabetes mellitus), type 2 with neurological complications  1. Urinary tract infection:  - Continue Rocephin for now, will not need on dc - culture contaminated  2. Diabetes mellitus:  - A1C 6.1, no further treatment needed  3. Neuropathy:  - Continue Cymbalta  4. Hypertension: continue home Norvasc and lasix  5. DVT prophylaxis: Heparin  6. GI prophylaxis: none  7. Dizziness, altered mental staus and change in functional status - CT and carotid dopplers normal - get MRI brain today to rule out stroke - daughter feels symptoms started with change in phychiatric meds (brand to generic  fluphenazine) Will obtain psych consult to see if this is the case - PT evaluation for functional status  8. COPD continues to smoke - continue Spiriva,albuterol - wean off of 02  All the records are reviewed and case discussed with Care Management/Social Worker. Management plans discussed with the patient and/or family.  CODE STATUS: Full  TOTAL TIME TAKING CARE OF THIS PATIENT: 35 minutes.   POSSIBLE D/C IN 1-2 DAYS, DEPENDING ON CLINICAL CONDITION.    Myrtis Ser 05/07/2015, 2:21 PM  [facility]  Office  [phone]  CC: Primary care physician; Chester Holstein, MD

## 2015-05-07 NOTE — Consult Note (Signed)
[facility] Consult   Reason for Consult:  Change in medication Referring Physician:  Dr. Gwendolyn Fill  Patient Identification: Brittany Hernandez MRN:  [id number] Principal Diagnosis: UTI (lower urinary tract infection)   Diagnosis:   Patient Active Problem List   Diagnosis Date Noted  . 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 Schizoaffective disorder [F25.9] 05/08/2015  . HTN (hypertension) [I10] 05/05/2015  . 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 female patient admitted with schizophrenia who is currently hospitalized for a UTI which is being treated & improving. The pt was interviewed 1:1 and then her daughter, Holley Raring was interviewed along with the pt for further information. The pt is doing well overall. She feels she has improved greatly since being treated for her UTI. The pt shared her strength is returning and she feels more like herself for which the pt is thankful.   The pt shared she has been taking a Prolixin injection for many years. She uses this medication for AH which started when she was in her 40s which she relates to marital problems and stressors related to raising her family. The AH are derogatory in nature and comment on things she has done in the past. The pt discussed AH tell her others have more power than she does. She does endorse thought withdrawal and thought broadcasting at times. She also endorses paranoia.  She denies thought insertion and feeling as if her body is under external control. She did not provide The shared, starting January 2016, she received a bottle of generic Prolixin from a different pharmacy. Since that time, she felt Prolixin is not working as well as it has in the past.  The pt shared prior to switching pharmacies, when she received Prolixin, AH would resolve.   The pt currently denies depression (is pan negative on SIG E CAPS), mania, PTSD and OCD symptoms. The pt denies nightmares.  The pt also shared she last heard voices yesterday and those have resolved. She has not heard voices today. Pt denies SI, HI and AVH at this time.   The pt's daughter shared the pt has been taking Prolixin for years. "She has been on medication for as long as I can remember." The pt has also been hospitalized several times when she was younger, the pt's daughter believes the pt was last hospitalized in the mid 1970s for depression and stressors related to raising her family. She is currently taking 125mg  injection monthly with good results. She corroborated the story of switching pharmacies in January 2016 and the medication not working as well.  The pt was provided her Prolixin injection on the evening of 05-04-15.  The pt is usually independent in her functioning per her daughter. The pt is able to cook, clean and maintain her own medication regimen per her daughter. The pt's daughter also shared the pt does not drive but never has done so.   The pt participates in [facility] where they will pick her up and take her to various places around town and to her doctors' appointments. The pt's daughter shared the pt is able to shop on her own and will visit her other children. She noticed on the evening on 05-04-15, after 9pm that her mother (  the pt) was confused, unable to stand and walk on her own. The pt's daughter then called 30 where the pt was brought to the ED for further evaluation then admitted for treatment of a UTI.    [name] denied the pt has experienced mania, disorganized speech, delusions, catatonia and negative symptoms in the past. The pt's daughter also shared she and her family will help to cheer the pt when she becomes sad with sadness lasting about 1 day. Pt's  daughter also discussed the pt had 2 brothers, several 1st cousins and a paternal uncle who experienced AH. None of these family members harmed or killed themselves or any one else in the past. She also shared the pt has never harmed or killed anyone else or attempted to take her own life in the past as well.   HPI Elements:    Location:  see HPI. Quality:  improving. Severity:  mild. Timing:  since 05-04-2015. Duration:  many years. Context:  in the setting of medication changes.  Past Medical History:  Past Medical History  Diagnosis Date  . Bladder cancer     s/p cystectomy    Past Surgical History  Procedure Laterality Date  . Cystectomy w/ continent diversion Right   . Cholecystectomy    . Abdominal hysterectomy    . Ureterostomy Right    Family History:  Family History  Problem Relation Age of Onset  . Diabetes Mellitus II Other   . CAD Other    Social History:  History  Alcohol Use No     History  Drug Use No    History   Social History  . Marital Status: Widowed    Spouse Name: N/A  . Number of Children: N/A  . Years of Education: N/A   Social History Main Topics  . Smoking status: Current Every Day Smoker -- 1.00 packs/day for 50 years    Types: Cigarettes  . Smokeless tobacco: Not on file  . Alcohol Use: No  . Drug Use: No  . Sexual Activity: Not on file   Other Topics Concern  . None   Social History Narrative  . None   Additional Social History:                          Allergies:   Allergies  Allergen Reactions  . Lisinopril Swelling    Labs:  Results for orders placed or performed during the hospital encounter of 05/04/15 (from the past 48 hour(s))  Glucose, capillary     Status: Abnormal   Collection Time: 05/05/15  9:50 PM  Result Value Ref Range   Glucose-Capillary 153 (H) 70 - 99 mg/dL  Glucose, capillary     Status: Abnormal   Collection Time: 05/06/15  7:22 AM  Result Value Ref Range   Glucose-Capillary 121 (H) 70  - 99 mg/dL   Comment 1 Notify RN   Glucose, capillary     Status: None   Collection Time: 05/06/15 11:42 AM  Result Value Ref Range   Glucose-Capillary 96 70 - 99 mg/dL  Glucose, capillary     Status: Abnormal   Collection Time: 05/06/15  4:09 PM  Result Value Ref Range   Glucose-Capillary 123 (H) 70 - 99 mg/dL  Glucose, capillary     Status: Abnormal   Collection Time: 05/06/15  8:44 PM  Result Value Ref Range   Glucose-Capillary 157 (H) 70 - 99 mg/dL  Glucose, capillary  Status: Abnormal   Collection Time: 05/07/15  7:26 AM  Result Value Ref Range   Glucose-Capillary 118 (H) 70 - 99 mg/dL  Glucose, capillary     Status: Abnormal   Collection Time: 05/07/15 11:19 AM  Result Value Ref Range   Glucose-Capillary 130 (H) 70 - 99 mg/dL  Glucose, capillary     Status: Abnormal   Collection Time: 05/07/15  5:02 PM  Result Value Ref Range   Glucose-Capillary 124 (H) 70 - 99 mg/dL    Vitals: Blood pressure 135/80, pulse 74, temperature 97.9 F (36.6 C), temperature source Oral, resp. rate 18, height 5\' 5"  (1.651 m), weight 68.675 kg (151 lb 6.4 oz), SpO2 92 %.  Risk to Self: Is patient at risk for suicide?: No Risk to Others:   Prior Inpatient Therapy:   Prior Outpatient Therapy:    Current Facility-Administered Medications  Medication Dose Route Frequency Provider Last Rate Last Dose  . acetaminophen (TYLENOL) tablet 650 mg  650 mg Oral Q6H PRN Vaughan Basta, MD   650 mg at 05/07/15 1659  . albuterol (PROVENTIL) (2.5 MG/3ML) 0.083% nebulizer solution 2.5 mg  2.5 mg Inhalation QID Epifanio Lesches, MD   2.5 mg at 05/07/15 1511  . amLODipine (NORVASC) tablet 10 mg  10 mg Oral Daily Harrie Foreman, MD   10 mg at 05/07/15 1029  . aspirin EC tablet 81 mg  81 mg Oral Daily Harrie Foreman, MD   81 mg at 05/07/15 1029  . cefTRIAXone (ROCEPHIN) 1 g in dextrose 5 % 50 mL IVPB  1 g Intravenous Q24H Harrie Foreman, MD   1 g at 05/07/15 0403  . DULoxetine (CYMBALTA) DR  capsule 30 mg  30 mg Oral Daily Harrie Foreman, MD   30 mg at 05/07/15 1029  . furosemide (LASIX) tablet 20 mg  20 mg Oral Daily Harrie Foreman, MD   20 mg at 05/07/15 1029  . heparin injection 5,000 Units  5,000 Units Subcutaneous 3 times per day Harrie Foreman, MD   5,000 Units at 05/07/15 1521  . insulin aspart (novoLOG) injection 0-9 Units  0-9 Units Subcutaneous TID WC Vaughan Basta, MD   1 Units at 05/07/15 1708  . meclizine (ANTIVERT) tablet 12.5 mg  12.5 mg Oral BID PRN Vaughan Basta, MD   12.5 mg at 05/06/15 1534  . multivitamin with minerals tablet 1 tablet  1 tablet Oral Daily Harrie Foreman, MD   1 tablet at 05/07/15 1029  . ondansetron (ZOFRAN) injection 4 mg  4 mg Intravenous Q6H PRN Vaughan Basta, MD   4 mg at 05/05/15 1738  . pneumococcal 23 valent vaccine (PNU-IMMUNE) injection 0.5 mL  0.5 mL Intramuscular Tomorrow-1000 Harrie Foreman, MD   0.5 mL at 05/06/15 1800  . tiotropium (SPIRIVA) inhalation capsule 18 mcg  18 mcg Inhalation Daily Epifanio Lesches, MD   18 mcg at 05/07/15 0822    Musculoskeletal: Strength & Muscle Tone: within normal limits Gait & Station: not examined Patient leans: N/A  Psychiatric Specialty Exam: Physical Exam  Review of Systems  Constitutional: Negative.   HENT: Negative.   Eyes: Negative.   Respiratory: Negative.   Cardiovascular: Negative.   Gastrointestinal: Negative.   Genitourinary: Negative.   Musculoskeletal: Negative.   Skin: Negative.   Neurological: Negative.   Endo/Heme/Allergies: Negative.   Psychiatric/Behavioral: Negative.     Blood pressure 135/80, pulse 74, temperature 97.9 F (36.6 C), temperature source Oral, resp. rate 18, height 5\' 5"  (1.651  m), weight 68.675 kg (151 lb 6.4 oz), SpO2 92 %.Body mass index is 25.19 kg/(m^2).  General Appearance: Casual and Well Groomed  Eye Contact::  Good  Speech:  Clear and Coherent and pt is edentulous  Volume:  Normal  Mood:  Euthymic   Affect:  Full Range  Thought Process:  Goal Directed, Linear and Logical  Orientation:  Full (Time, Place, and Person)  Thought Content:  Negative  Suicidal Thoughts:  No  Homicidal Thoughts:  No  Memory:  Immediate;   Good Recent;   Good Remote;   Good  Judgement:  Good  Insight:  Good  Psychomotor Activity:  Normal  Concentration:  Good  Recall:  Good  Fund of Knowledge:Good  Language: Good  Akathisia:  No  Handed:  Right  AIMS (if indicated):  0 (zero)  Assets:  Communication Skills Desire for Improvement Financial Resources/Insurance Housing Leisure Time Physical Health Resilience Social Support Talents/Skills Transportation Vocational/Educational  ADL's:  Intact  Cognition: WNL  Sleep:  Adequate   Medical Decision Making: Established Problem, Stable/Improving (1), Self-Limited or Minor (1), Review of Psycho-Social Stressors (1), Review or order clinical lab tests (1) and Review or order medicine tests (1)  Treatment Plan Summary: Medication management   Assessment/Plan: Pt is a 74 y.o. with Schizoaffective disorder who presents with AH as noted in the HPI in the context of a recent switch in delivery of Prolixin from a different pharmacy. The pt has a 3 month supply of Prolixin from a new pharmacy and the pt does not feel it is working as well as it has in the past. Pt notes with Prolixin, AH normally resolve. Since starting Prolixin from a new pharmacy, [place] have not resolved completely since January 2016. Her most recent dosage was on 05-04-2015. Long discussion with the pt and her daughter in regards to attempting to receive Prolixin from the pt's former pharmacy and have them order medication from the same company. Psychopharmacology of generics discussed including the absorption of Prolixin injectable in generic form. Also discussed resources in the community and nearby communities in regards to locating a geriatric psychiatrist to manage the pt's medications as the pt  and her daughter would like to switch the pt's psychiatrist. Pt given the name of a geriatric psychiatrist in [place], [place] and told to look at the following schools for geriatric psychiatry including [place] and [place]. [name] instructed to call the mental health/psychiatry number on the back of the pt's insurance card to locate a psychiatrist who is covered by the pt's insurance. The pt currently denies SI, HI and AVH.   Plan:  No evidence of imminent risk to self or others at present.   Patient does not meet criteria for psychiatric inpatient admission. Supportive therapy provided about ongoing stressors. Discussed crisis plan, support from social network, calling 911, coming to the Emergency Department, and calling Suicide Hotline.  Disposition: Home when medically stable.   [facility] would like to thank you for this consult. Psychiatry will sign off on [name] Case at this time.   Thanks,   Donita Brooks, M.D. Attending Physician [place] 05/08/2015 8:26 AM

## 2015-05-08 ENCOUNTER — Observation Stay: Payer: Medicare Other

## 2015-05-08 DIAGNOSIS — F259 Schizoaffective disorder, unspecified: Secondary | ICD-10-CM | POA: Diagnosis present

## 2015-05-08 DIAGNOSIS — N390 Urinary tract infection, site not specified: Secondary | ICD-10-CM | POA: Diagnosis not present

## 2015-05-08 DIAGNOSIS — J439 Emphysema, unspecified: Secondary | ICD-10-CM | POA: Insufficient documentation

## 2015-05-08 DIAGNOSIS — F333 Major depressive disorder, recurrent, severe with psychotic symptoms: Secondary | ICD-10-CM

## 2015-05-08 LAB — GLUCOSE, CAPILLARY
Glucose-Capillary: 112 mg/dL — ABNORMAL HIGH (ref 70–99)
Glucose-Capillary: 208 mg/dL — ABNORMAL HIGH (ref 70–99)

## 2015-05-08 MED ORDER — TIOTROPIUM BROMIDE MONOHYDRATE 18 MCG IN CAPS
18.0000 ug | ORAL_CAPSULE | Freq: Every day | RESPIRATORY_TRACT | Status: DC
Start: 2015-05-08 — End: 2015-05-08

## 2015-05-08 MED ORDER — TIOTROPIUM BROMIDE MONOHYDRATE 18 MCG IN CAPS: 18.0000 ug | ORAL_CAPSULE | Freq: Every day | RESPIRATORY_TRACT | Status: DC

## 2015-05-08 MED ORDER — CEFTRIAXONE SODIUM IN DEXTROSE 20 MG/ML IV SOLN
1.0000 g | INTRAVENOUS | Status: DC
  Filled 2015-05-08: qty 50

## 2015-05-08 MED ORDER — MECLIZINE HCL 12.5 MG PO TABS: 12.5000 mg | ORAL_TABLET | Freq: Two times a day (BID) | ORAL | Status: DC | PRN

## 2015-05-08 NOTE — Discharge Instructions (Signed)
°  DIET:  Cardiac diet  DISCHARGE CONDITION:  Stable  ACTIVITY:  Activity as tolerated  OXYGEN:  Home Oxygen: No.   Oxygen Delivery: room air  DISCHARGE LOCATION:  home with home health physical therapy  If you experience worsening of your admission symptoms, develop shortness of breath, life threatening emergency, suicidal or homicidal thoughts you must seek medical attention immediately by calling 911 or calling your MD immediately  if symptoms less severe.  You Must read complete instructions/literature along with all the possible adverse reactions/side effects for all the Medicines you take and that have been prescribed to you. Take any new Medicines after you have completely understood and accpet all the possible adverse reactions/side effects.   Please note  You were cared for by a hospitalist during your hospital stay. If you have any questions about your discharge medications or the care you received while you were in the hospital after you are discharged, you can call the unit and asked to speak with the hospitalist on call if the hospitalist that took care of you is not available. Once you are discharged, your primary care physician will handle any further medical issues. Please note that NO REFILLS for any discharge medications will be authorized once you are discharged, as it is imperative that you return to your primary care physician (or establish a relationship with a primary care physician if you do not have one) for your aftercare needs so that they can reassess your need for medications and monitor your lab values.   Please follow Dr. Ileene Hutchinson recommendations to find a geropsychiatrist  This patient is at high risk for falls due to loss of balance, deconditioning and confusion.  She will have home health PT and will also  need 24 hour supervision.

## 2015-05-08 NOTE — Evaluation (Signed)
Physical Therapy Evaluation Patient Details Name: Brittany Hernandez MRN: [id number] DOB: 1941-09-24 Today's Date: 05/08/2015   History of Present Illness  The patient presents to the emergency department complaining of dizziness especially when she moves her head. She complains of fevers and chills as well as nausea vomiting and diarrhea for a few days. Her emesis has been nonbloody and nonbilious. The patient's past medical history is significant for cystectomy and ureterostomy. A urine sample was obtained from the bag which showed too numerous to count bacteria without evidence of contamination which prompted the emergency department call for admission.  Clinical Impression  Pleasant 74 y.o. woman with recent onset of dizziness and headache. Patient demonstrates generalized weakness and impaired balance affecting her mobility and placing her at increased risk of falling. Patient will benefit from skilled PT services to return her to her prior level of independence at home and limited community participation.    Follow Up Recommendations Home health PT;Supervision for mobility/OOB    Equipment Recommendations  Rolling walker with 5" wheels    Recommendations for Other Services       Precautions / Restrictions Precautions Precautions: Fall Restrictions Weight Bearing Restrictions: No      Mobility  Bed Mobility Overal bed mobility: Modified Independent (requires bilateral bedrails)                Transfers Overall transfer level: Modified independent               General transfer comment: Requires use of arms to come to stand. Sit/stand with wide BOS and slow. Requires verbal cueing for safe sequencing of sit/stand.  Ambulation/Gait Ambulation/Gait assistance: Modified independent (Device/Increase time);Min guard (with min asst for one LOB to the left) Ambulation Distance (Feet): 170 Feet Assistive device: Rolling walker (2 wheeled) Gait Pattern/deviations: Decreased  step length - left;Decreased step length - right;Step-through pattern (one LOB to left) Gait velocity: 1.2 ft/sec Gait velocity interpretation: <1.8 ft/sec, indicative of risk for recurrent falls General Gait Details: Slow, continuous gait. Pushes walker out in front and requires frequent cueing to satnd within walker space.  Stairs Stairs: Yes Stairs assistance: Min assist Stair Management: Sideways;One rail Right;One rail Left Number of Stairs: 7 General stair comments: Instructed patient to lead with left LE (more confident of L knee) going up and right going down. Required repeated cuing.  Wheelchair Mobility    Modified Rankin (Stroke Patients Only)       Balance Overall balance assessment: Needs assistance     Sitting balance - Comments: Good       Standing balance comment: unable to attempt SLS either LE; unable to attempt tandem stance         [place] - Eyes Opened: 2                   Pertinent Vitals/Pain Pain Assessment: No/denies pain    Home Living Family/patient expects to be discharged to:: Private residence (Pt wishes to go home but will likely need 24 hr supervision) Living Arrangements: Children Available Help at Discharge: Family;Other (Comment) (Home care aide assists with bathing) Type of Home: House Home Access: Stairs to enter Entrance Stairs-Rails: Right;Left (Can reach only one rail at a time) Technical brewer of Steps: 4 Home Layout: One level Home Equipment: Bedside commode      Prior Function Level of Independence: Independent with assistive device(s) (Limited community ambulator with cane)               Hand Dominance  Dominant Hand: Right    Extremity/Trunk Assessment   Upper Extremity Assessment: Overall WFL for tasks assessed           Lower Extremity Assessment: Generalized weakness RLE Deficits / Details: Grossly 4/5 except hip abduction 2/5 LLE Deficits / Details: Grossly 4/5 except hip  abduction 2/5. Must use arms to stand from chair.     Communication   Communication: No difficulties  Cognition Arousal/Alertness: Awake/alert Behavior During Therapy: WFL for tasks assessed/performed Overall Cognitive Status: Within Functional Limits for tasks assessed       Memory:  (Moderate recall of instructions for safe walker use)              General Comments      Exercises        Assessment/Plan    PT Assessment Patient needs continued PT services  PT Diagnosis Difficulty walking;Generalized weakness;Other (comment) (balance impairments with increased fall risk)   PT Problem List Decreased strength;Decreased activity tolerance;Decreased balance;Decreased mobility;Decreased safety awareness  PT Treatment Interventions Gait training;Stair training;Therapeutic activities;Therapeutic exercise;Balance training;Neuromuscular re-education;Patient/family education   PT Goals (Current goals can be found in the Care Plan section) Acute Rehab PT Goals Patient Stated Goal: "To be able to walk and get around like I used to." PT Goal Formulation: With patient/family Time For Goal Achievement: 05/22/15 Potential to Achieve Goals: Good    Frequency Min 2X/week   Barriers to discharge Decreased caregiver support Daughter plans to speak with CAP case worker about possible increases in home care.    Co-evaluation               End of Session Equipment Utilized During Treatment: Gait belt Activity Tolerance: Patient tolerated treatment well (required rest between ambulation and stair training) Patient left: in chair;with call bell/phone within reach;with chair alarm set;with family/visitor present Nurse Communication: Mobility status    Functional Assessment Tool Used: clinical judgment Functional Limitation: Mobility: Walking and moving around Mobility: Walking and Moving Around Current Status (I0165): At least 20 percent but less than 40 percent impaired, limited  or restricted Mobility: Walking and Moving Around Goal Status [phone]): At least 1 percent but less than 20 percent impaired, limited or restricted    Time: 0813-0905 PT Time Calculation (min) (ACUTE ONLY): 52 min   Charges:   PT Evaluation $Initial PT Evaluation Tier I: 1 Procedure PT Treatments $Gait Training: 23-37 mins   PT G Codes:   PT G-Codes **NOT FOR INPATIENT CLASS** Functional Assessment Tool Used: clinical judgment Functional Limitation: Mobility: Walking and moving around Mobility: Walking and Moving Around Current Status (O7078): At least 20 percent but less than 40 percent impaired, limited or restricted Mobility: Walking and Moving Around Goal Status [phone]): At least 1 percent but less than 20 percent impaired, limited or restricted   Violet Baldy, PT, [place] 05/08/2015, 9:53 AM

## 2015-05-08 NOTE — Progress Notes (Signed)
Brittany Hernandez reports that she receives CAP services. Discharged today with a request for home health PT and nurse Aid. A referral for nurse Aid and PT was called and faxed to [facility]. A script was faxed to [facility] for a front wheel rolling walker to be delivered to Brittany Wedin today.

## 2015-05-08 NOTE — Progress Notes (Signed)
Pt to be discharged with hh and PT. Reviewed instructions with pt and family, all questions answered. IV removed. Provided pt with written education about UTI. Awaiting walker from home health care then pt. Will be discharged.

## 2015-05-08 NOTE — Progress Notes (Signed)
Discussed home health planning with Brittany Hernandez who chose [facility] as her home health provider. A referral for home health PT and nurse aid were faxed and called to [facility]. Will fax request for a front wheel rolling walker as soon as a script for a rolling walker is received.

## 2015-05-20 NOTE — Discharge Summary (Signed)
[facility] [place]   PATIENT NAME: Brittany Hernandez    MR#:  [id number]  DATE OF BIRTH:  February 24, 1941  DATE OF ADMISSION:  05/04/2015 ADMITTING PHYSICIAN: Harrie Foreman, MD  DATE OF DISCHARGE: 05/08/2015  6:32 PM  PRIMARY CARE PHYSICIAN: GOLDSTEIN, ADAM Jenetta Downer, MD    ADMISSION DIAGNOSIS:  Urinary tract infection, acute [N39.0]  Altered mental status  DISCHARGE DIAGNOSIS:  Active Problems:   HTN (hypertension)   DM (diabetes mellitus), type 2 with neurological complications   Severe recurrent depression with psychosis   Schizoaffective disorder  altered mental status likely due to change in psychiatric medications  SECONDARY DIAGNOSIS:   Past Medical History  Diagnosis Date  . Bladder cancer     s/p cystectomy    HOSPITAL COURSE:   1. Urinary tract infection:  - Initial UA with TMTC white blood cells. Culture with contamination likely fecal contamination. Treated with Rocephin while inpatient. This is less likely to be the cause of her altered mental status. No leukocytosis or fever or other signs of systemic infection. She should have a repeat UA in the outpatient setting to ensure clearance.  2. Diabetes mellitus:  - A1C 6.1, no further treatment needed  3. Neuropathy:  - Continue Cymbalta  4. Hypertension: continue home Norvasc and lasix. Blood pressures well controlled during hospitalization  5. Dizziness, altered mental staus and change in functional status: This was her most significant presenting complaint. CT, MRI and carotid dopplers normal. Daughter feels symptoms started with change in phychiatric meds (brand to generic fluphenazine). Psychiatry, Dr. Tami Ribas agrees that this is likely the case. She has a new prescription and a referral to a geriatric psychiatrist. Physical therapy has evaluated the patient and recommended home health physical therapy and a rolling walker. These will be provided for her.  He has significant family support  6. 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 Continue Spiriva,albuterol. Does not need home oxygen   DISCHARGE CONDITIONS:   Stable  CONSULTS OBTAINED:    psychiatry Dr. Tami Ribas  DRUG ALLERGIES:   Allergies  Allergen Reactions  . Lisinopril Swelling    DISCHARGE MEDICATIONS:   Discharge Medication List as of 05/08/2015  5:10 PM    CONTINUE these medications which have CHANGED   Details  meclizine (ANTIVERT) 12.5 MG tablet Take 1 tablet (12.5 mg total) by mouth 2 (two) times daily as needed for dizziness., Starting 05/08/2015, Until Discontinued, Print    tiotropium (SPIRIVA) 18 MCG inhalation capsule Place 1 capsule (18 mcg total) into inhaler and inhale daily., Starting 05/08/2015, Until Discontinued, Print      CONTINUE these medications which have NOT CHANGED   Details  amLODipine (NORVASC) 5 MG tablet Take 10 mg by mouth daily. Takes 2 5mg  tabs, Until Discontinued, Historical Med    aspirin EC 81 MG tablet Take 81 mg by mouth daily., Until Discontinued, Historical Med    DULoxetine (CYMBALTA) 30 MG capsule Take 30 mg by mouth daily., Until Discontinued, Historical Med    furosemide (LASIX) 20 MG tablet Take 20 mg by mouth daily., Until Discontinued, Historical Med    fluPHENAZine decanoate (PROLIXIN) 25 MG/ML injection Starting 04/13/2015, Until Discontinued, Historical Med         DISCHARGE INSTRUCTIONS:    See AVS  If you experience worsening of your admission symptoms, develop shortness of breath, life threatening emergency, suicidal or homicidal thoughts you must seek medical attention immediately by calling 911 or calling your  MD immediately  if symptoms less severe.  You Must read complete instructions/literature along with all the possible adverse reactions/side effects for all the Medicines you take and that have been prescribed to you. Take any new Medicines after you have completely understood and accept all the  possible adverse reactions/side effects.   Please note  You were cared for by a hospitalist during your hospital stay. If you have any questions about your discharge medications or the care you received while you were in the hospital after you are discharged, you can call the unit and asked to speak with the hospitalist on call if the hospitalist that took care of you is not available. Once you are discharged, your primary care physician will handle any further medical issues. Please note that NO REFILLS for any discharge medications will be authorized once you are discharged, as it is imperative that you return to your primary care physician (or establish a relationship with a primary care physician if you do not have one) for your aftercare needs so that they can reassess your need for medications and monitor your lab values.    Today   CHIEF COMPLAINT:   Chief Complaint  Patient presents with  . Dizziness  . Nausea  . Emesis    HISTORY OF PRESENT ILLNESS:  HPI: The patient presents to the emergency department complaining of dizziness especially when she moves her head. She complains of fevers and chills as well as nausea vomiting and diarrhea for a few days. Her emesis has been nonbloody and nonbilious. The patient's past medical history is significant for cystectomy and ureterostomy. A urine sample was obtained from the bag which showed too numerous to count bacteria without evidence of contamination which prompted the emergency department call for admission.   VITAL SIGNS:  Blood pressure 137/76, pulse 72, temperature 98.6 F (37 C), temperature source Oral, resp. rate 16, height 5\' 5"  (1.651 m), weight 68.675 kg (151 lb 6.4 oz), SpO2 93 %.  I/O:  No intake or output data in the 24 hours ending 05/20/15 1055  PHYSICAL EXAMINATION:  GENERAL:  74 y.o.-year-old patient lying in the bed with no acute distress.  EYES: Pupils equal, round, reactive to light and accommodation. No  scleral icterus. Extraocular muscles intact.  HEENT: Head atraumatic, normocephalic. Oropharynx and nasopharynx clear.  NECK:  Supple, no jugular venous distention. No thyroid enlargement, no tenderness.  LUNGS: Normal breath sounds bilaterally, scattered fine wheezing, no rales, rhonchi or crepitation. No use of accessory muscles of respiration.  CARDIOVASCULAR: S1, S2 normal. No murmurs, rubs, or gallops.  ABDOMEN: Soft, non-tender, non-distended. Bowel sounds present. No organomegaly or mass.  EXTREMITIES: No pedal edema, cyanosis, or clubbing.  NEUROLOGIC: Cranial nerves II through XII are intact. Muscle strength 5/5 in all extremities. Sensation intact. Gait not checked.  PSYCHIATRIC: The patient is alert and oriented x 3. She does get slightly confusing conversation. She is in good spirits SKIN: No obvious rash, lesion, or ulcer.   DATA REVIEW:   CBC No results for input(s): WBC, HGB, HCT, PLT in the last 168 hours.  Chemistries  No results for input(s): NA, K, CL, CO2, GLUCOSE, BUN, CREATININE, CALCIUM, MG, AST, ALT, ALKPHOS, BILITOT in the last 168 hours.  Invalid input(s): GFRCGP  Cardiac Enzymes No results for input(s): TROPONINI in the last 168 hours.  Microbiology Results  Results for orders placed or performed during the hospital encounter of 05/04/15  Urine culture     Status: None   Collection  Time: 05/04/15 10:53 PM  Result Value Ref Range Status   Specimen Description URINE, CLEAN CATCH  Final   Special Requests NONE  Final   Culture   Final    MULTIPLE SPECIES PRESENT, SUGGEST RECOLLECTION IF CLINICALLY INDICATED Probably contamination with fecal flora.    Report Status 05/06/2015 FINAL  Final    RADIOLOGY:  No results found.  EKG:   Orders placed or performed during the hospital encounter of 05/04/15  . ED EKG  . ED EKG  . EKG 12-Lead  . EKG 12-Lead  . EKG      Management plans discussed with the patient, family and they are in  agreement.  CODE STATUS: Full  TOTAL TIME TAKING CARE OF THIS PATIENT: 35 minutes.    Myrtis Ser M.D on 05/20/2015 at 10:55 AM  Between 7am to 6pm - Pager - [phone]  After 6pm go to [email or web address] - password [email or web address] [facility]  Office  [phone]  CC: Primary care physician; Chester Holstein, MD

## 2016-02-12 NOTE — Unmapped (Signed)
Referring MD:  No referring provider defined for this encounter.    PCP:  Evette Cristal, MD   [id number] [facility] [place] [address]      ASSESSMENT:  T2 N0 bladder cancer, status post neoadjuvant chemotherapy and cystectomy/ic 10/2011. CT and CXR ***.    PLAN:  1.  ***Stoma completely normal, rtc 12 months with imaging  2. Cytology today      CC Bladder cancer follow up    HPI:  75 y.o. female with a h/o T2 N0 bladder cancer, status post neoadjuvant chemotherapy and cystectomy, which was performed in 10/2011.  ***Doing well with no complaints. Stoma is not causing problems and draining. The patient is gaining weight and tolerating a diet. The patient denies pain or f/c.    PE:  There were no vitals taken for this visit.  No acute distress, AOX3  ABD:  Soft, non tender, non distended, incisions well healed, stoma pink/normal with clear urine in the bag  BACK:  No CVA tenderness    No results found for: PSASCRN, PSADIAG  Lab Results   Component Value Date    CREATININE 1.06* 12/08/2015     Imaging:  CT A/P ***, CXR ***          _________________  Scribe's Attestation: *** obtained and performed the history, physical exam and medical decision making elements that were entered into the chart. Documentation assistance was provided by me personally, a scribe.  Signed by Alvester Morin, Scribe, on February 12, 2016 at 2:14 PM.     {** NOTE TO PROVIDER: PLEASE ADD ATTESTATION NOTING YOU AGREE WITH SCRIBE DOCUMENTATION**}

## 2017-07-18 ENCOUNTER — Ambulatory Visit: Admission: RE | Admit: 2017-07-18 | Discharge: 2017-07-18 | Attending: Pharmacist

## 2017-07-18 DIAGNOSIS — J431 Panlobular emphysema: Principal | ICD-10-CM

## 2017-07-18 DIAGNOSIS — J438 Other emphysema: Secondary | ICD-10-CM

## 2017-07-31 ENCOUNTER — Ambulatory Visit: Admission: RE | Admit: 2017-07-31 | Discharge: 2017-07-31

## 2017-07-31 DIAGNOSIS — F251 Schizoaffective disorder, depressive type: Secondary | ICD-10-CM

## 2017-07-31 DIAGNOSIS — E1149 Type 2 diabetes mellitus with other diabetic neurological complication: Secondary | ICD-10-CM

## 2017-07-31 DIAGNOSIS — I10 Essential (primary) hypertension: Secondary | ICD-10-CM

## 2017-07-31 DIAGNOSIS — J438 Other emphysema: Principal | ICD-10-CM

## 2017-07-31 DIAGNOSIS — M1710 Unilateral primary osteoarthritis, unspecified knee: Secondary | ICD-10-CM

## 2017-07-31 DIAGNOSIS — E7800 Pure hypercholesterolemia, unspecified: Secondary | ICD-10-CM

## 2017-07-31 DIAGNOSIS — C675 Malignant neoplasm of bladder neck: Secondary | ICD-10-CM

## 2017-07-31 DIAGNOSIS — F17200 Nicotine dependence, unspecified, uncomplicated: Secondary | ICD-10-CM

## 2017-07-31 DIAGNOSIS — N182 Chronic kidney disease, stage 2 (mild): Secondary | ICD-10-CM

## 2017-07-31 MED ORDER — METFORMIN 500 MG TABLET
ORAL_TABLET | 11 refills | 0 days | Status: CP
Start: 2017-07-31 — End: 2018-05-28

## 2017-07-31 MED ORDER — GABAPENTIN 100 MG CAPSULE
ORAL_CAPSULE | 2 refills | 0 days | Status: CP
Start: 2017-07-31 — End: 2017-11-07

## 2017-08-07 MED ORDER — LOSARTAN 50 MG TABLET
ORAL_TABLET | Freq: Every day | ORAL | 1 refills | 0 days | Status: CP
Start: 2017-08-07 — End: 2017-11-27

## 2017-09-18 ENCOUNTER — Ambulatory Visit
Admission: RE | Admit: 2017-09-18 | Discharge: 2017-09-18 | Disposition: A | Discharge status: Home / Self Care | Admitting: Family Medicine

## 2017-09-18 DIAGNOSIS — E1140 Type 2 diabetes mellitus with diabetic neuropathy, unspecified: Principal | ICD-10-CM

## 2017-09-18 DIAGNOSIS — G63 Polyneuropathy in diseases classified elsewhere: Secondary | ICD-10-CM

## 2017-09-18 DIAGNOSIS — L989 Disorder of the skin and subcutaneous tissue, unspecified: Secondary | ICD-10-CM

## 2017-10-18 ENCOUNTER — Ambulatory Visit: Admission: RE | Admit: 2017-10-18 | Discharge: 2017-10-18

## 2017-10-18 DIAGNOSIS — M1610 Unilateral primary osteoarthritis, unspecified hip: Secondary | ICD-10-CM

## 2017-10-18 DIAGNOSIS — I10 Essential (primary) hypertension: Secondary | ICD-10-CM

## 2017-10-18 DIAGNOSIS — E1100 Type 2 diabetes mellitus with hyperosmolarity without nonketotic hyperglycemic-hyperosmolar coma (NKHHC): Secondary | ICD-10-CM

## 2017-10-18 DIAGNOSIS — Z1331 Encounter for screening for depression: Secondary | ICD-10-CM

## 2017-10-18 DIAGNOSIS — B351 Tinea unguium: Secondary | ICD-10-CM

## 2017-10-18 DIAGNOSIS — G589 Mononeuropathy, unspecified: Secondary | ICD-10-CM

## 2017-10-18 DIAGNOSIS — N182 Chronic kidney disease, stage 2 (mild): Secondary | ICD-10-CM

## 2017-10-18 DIAGNOSIS — E1149 Type 2 diabetes mellitus with other diabetic neurological complication: Secondary | ICD-10-CM

## 2017-10-18 DIAGNOSIS — E1142 Type 2 diabetes mellitus with diabetic polyneuropathy: Secondary | ICD-10-CM

## 2017-10-18 DIAGNOSIS — E1169 Type 2 diabetes mellitus with other specified complication: Secondary | ICD-10-CM

## 2017-10-18 DIAGNOSIS — M150 Primary generalized (osteo)arthritis: Secondary | ICD-10-CM

## 2017-10-18 DIAGNOSIS — C670 Malignant neoplasm of trigone of bladder: Principal | ICD-10-CM

## 2017-10-18 DIAGNOSIS — J439 Emphysema, unspecified: Secondary | ICD-10-CM

## 2017-10-18 MED ORDER — VARENICLINE 0.5 MG (11)-1 MG (42) TABLETS IN A DOSE PACK
PACK | 0 refills | 0 days | Status: SS
Start: 2017-10-18 — End: 2018-04-08

## 2017-10-18 MED ORDER — DICLOFENAC 1 % TOPICAL GEL
Freq: Four times a day (QID) | TOPICAL | 2 refills | 0.00000 days | Status: CP
Start: 2017-10-18 — End: 2017-11-18

## 2017-10-28 ENCOUNTER — Ambulatory Visit: Admission: RE | Admit: 2017-10-28 | Discharge: 2017-10-28 | Payer: MEDICARE

## 2017-10-28 DIAGNOSIS — L309 Dermatitis, unspecified: Secondary | ICD-10-CM

## 2017-10-28 DIAGNOSIS — C670 Malignant neoplasm of trigone of bladder: Secondary | ICD-10-CM

## 2017-10-28 DIAGNOSIS — B379 Candidiasis, unspecified: Principal | ICD-10-CM

## 2017-10-29 ENCOUNTER — Ambulatory Visit: Admission: RE | Admit: 2017-10-29 | Discharge: 2017-10-29 | Disposition: A | Discharge status: Home / Self Care

## 2017-10-29 DIAGNOSIS — N99528 Other complication of other external stoma of urinary tract: Principal | ICD-10-CM

## 2017-11-07 ENCOUNTER — Ambulatory Visit
Admission: RE | Admit: 2017-11-07 | Discharge: 2017-11-07 | Disposition: A | Discharge status: Home / Self Care | Payer: MEDICARE

## 2017-11-07 ENCOUNTER — Ambulatory Visit
Admission: RE | Admit: 2017-11-07 | Discharge: 2017-11-07 | Disposition: A | Discharge status: Home / Self Care | Payer: MEDICARE | Admitting: Family Medicine

## 2017-11-07 DIAGNOSIS — M5412 Radiculopathy, cervical region: Principal | ICD-10-CM

## 2017-11-07 MED ORDER — MELOXICAM 7.5 MG TABLET
ORAL_TABLET | Freq: Every day | ORAL | 3 refills | 0 days | Status: CP
Start: 2017-11-07 — End: 2018-01-06

## 2017-11-13 MED ORDER — ACETAMINOPHEN 500 MG TABLET
ORAL_TABLET | 2 refills | 0 days | Status: CP
Start: 2017-11-13 — End: ?

## 2017-11-18 MED ORDER — DICLOFENAC 1 % TOPICAL GEL
Freq: Four times a day (QID) | TOPICAL | 2 refills | 0.00000 days | Status: CP
Start: 2017-11-18 — End: 2018-02-08

## 2017-11-27 MED ORDER — LOSARTAN 50 MG TABLET
ORAL_TABLET | 1 refills | 0 days | Status: CP
Start: 2017-11-27 — End: 2018-01-21

## 2017-12-04 ENCOUNTER — Ambulatory Visit: Admission: RE | Admit: 2017-12-04 | Discharge: 2017-12-04

## 2017-12-04 DIAGNOSIS — D696 Thrombocytopenia, unspecified: Secondary | ICD-10-CM

## 2017-12-04 DIAGNOSIS — G609 Hereditary and idiopathic neuropathy, unspecified: Secondary | ICD-10-CM

## 2017-12-04 DIAGNOSIS — M47812 Spondylosis without myelopathy or radiculopathy, cervical region: Principal | ICD-10-CM

## 2017-12-04 DIAGNOSIS — F17200 Nicotine dependence, unspecified, uncomplicated: Secondary | ICD-10-CM

## 2017-12-04 DIAGNOSIS — C670 Malignant neoplasm of trigone of bladder: Secondary | ICD-10-CM

## 2017-12-04 DIAGNOSIS — I10 Essential (primary) hypertension: Secondary | ICD-10-CM

## 2017-12-04 DIAGNOSIS — J438 Other emphysema: Secondary | ICD-10-CM

## 2017-12-04 DIAGNOSIS — B351 Tinea unguium: Secondary | ICD-10-CM

## 2017-12-04 DIAGNOSIS — E1169 Type 2 diabetes mellitus with other specified complication: Secondary | ICD-10-CM

## 2017-12-04 DIAGNOSIS — E1142 Type 2 diabetes mellitus with diabetic polyneuropathy: Secondary | ICD-10-CM

## 2017-12-04 DIAGNOSIS — E1149 Type 2 diabetes mellitus with other diabetic neurological complication: Secondary | ICD-10-CM

## 2017-12-04 DIAGNOSIS — F259 Schizoaffective disorder, unspecified: Secondary | ICD-10-CM

## 2017-12-04 MED ORDER — AMOXICILLIN 500 MG TABLET
ORAL_TABLET | Freq: Three times a day (TID) | ORAL | 0 refills | 0 days | Status: SS
Start: 2017-12-04 — End: 2018-04-08

## 2017-12-06 ENCOUNTER — Ambulatory Visit
Admission: RE | Admit: 2017-12-06 | Discharge: 2017-12-06 | Disposition: A | Discharge status: Home / Self Care | Payer: MEDICARE

## 2017-12-06 DIAGNOSIS — N99528 Other complication of other external stoma of urinary tract: Principal | ICD-10-CM

## 2017-12-18 ENCOUNTER — Ambulatory Visit: Admission: RE | Admit: 2017-12-18 | Discharge: 2017-12-18 | Disposition: A | Discharge status: Home / Self Care

## 2017-12-18 ENCOUNTER — Ambulatory Visit
Admission: RE | Admit: 2017-12-18 | Discharge: 2017-12-18 | Disposition: A | Discharge status: Home / Self Care | Payer: MEDICARE

## 2017-12-18 DIAGNOSIS — C679 Malignant neoplasm of bladder, unspecified: Principal | ICD-10-CM

## 2017-12-18 DIAGNOSIS — R399 Unspecified symptoms and signs involving the genitourinary system: Principal | ICD-10-CM

## 2017-12-19 MED ORDER — FUROSEMIDE 20 MG TABLET
ORAL_TABLET | 2 refills | 0 days | Status: CP
Start: 2017-12-19 — End: 2018-09-30

## 2018-01-06 ENCOUNTER — Ambulatory Visit: Admit: 2018-01-06 | Discharge: 2018-01-07 | Payer: MEDICARE | Attending: Community Health | Primary: Community Health

## 2018-01-06 DIAGNOSIS — I10 Essential (primary) hypertension: Principal | ICD-10-CM

## 2018-01-06 DIAGNOSIS — M5412 Radiculopathy, cervical region: Secondary | ICD-10-CM

## 2018-01-06 DIAGNOSIS — K5900 Constipation, unspecified: Secondary | ICD-10-CM

## 2018-01-06 MED ORDER — LOSARTAN 100 MG TABLET
ORAL_TABLET | Freq: Every day | ORAL | 11 refills | 0.00000 days | Status: CP
Start: 2018-01-06 — End: 2018-03-11

## 2018-01-06 MED ORDER — POLYETHYLENE GLYCOL 3350 17 GRAM ORAL POWDER PACKET
PACK | Freq: Every day | ORAL | 3 refills | 0.00000 days | Status: SS
Start: 2018-01-06 — End: 2018-04-08

## 2018-01-06 MED ORDER — MELOXICAM 7.5 MG TABLET
ORAL_TABLET | Freq: Every day | ORAL | 3 refills | 0.00000 days | Status: CP
Start: 2018-01-06 — End: 2018-03-19

## 2018-01-20 MED ORDER — VENTOLIN HFA 90 MCG/ACTUATION AEROSOL INHALER
0 refills | 0 days | Status: CP
Start: 2018-01-20 — End: 2018-03-04

## 2018-01-21 ENCOUNTER — Ambulatory Visit: Admit: 2018-01-21 | Discharge: 2018-01-22 | Payer: MEDICARE

## 2018-01-21 DIAGNOSIS — J438 Other emphysema: Secondary | ICD-10-CM

## 2018-01-21 DIAGNOSIS — F17200 Nicotine dependence, unspecified, uncomplicated: Secondary | ICD-10-CM

## 2018-01-21 DIAGNOSIS — G609 Hereditary and idiopathic neuropathy, unspecified: Secondary | ICD-10-CM

## 2018-01-21 DIAGNOSIS — M25512 Pain in left shoulder: Secondary | ICD-10-CM

## 2018-01-21 DIAGNOSIS — I10 Essential (primary) hypertension: Secondary | ICD-10-CM

## 2018-01-21 DIAGNOSIS — E1149 Type 2 diabetes mellitus with other diabetic neurological complication: Secondary | ICD-10-CM

## 2018-01-21 DIAGNOSIS — C675 Malignant neoplasm of bladder neck: Secondary | ICD-10-CM

## 2018-01-21 DIAGNOSIS — N182 Chronic kidney disease, stage 2 (mild): Secondary | ICD-10-CM

## 2018-01-21 DIAGNOSIS — M47812 Spondylosis without myelopathy or radiculopathy, cervical region: Principal | ICD-10-CM

## 2018-01-21 MED ORDER — GABAPENTIN 100 MG CAPSULE
ORAL_CAPSULE | 2 refills | 0 days | Status: CP
Start: 2018-01-21 — End: 2018-06-05

## 2018-01-21 MED ORDER — MEDICAL SUPPLY ITEM
11 refills | 0 days | Status: CP
Start: 2018-01-21 — End: 2018-03-12

## 2018-02-06 ENCOUNTER — Ambulatory Visit: Admit: 2018-02-06 | Discharge: 2018-02-07 | Payer: MEDICARE

## 2018-02-06 DIAGNOSIS — M47812 Spondylosis without myelopathy or radiculopathy, cervical region: Principal | ICD-10-CM

## 2018-02-06 DIAGNOSIS — M25512 Pain in left shoulder: Secondary | ICD-10-CM

## 2018-02-10 MED ORDER — DICLOFENAC 1 % TOPICAL GEL
5 refills | 0 days | Status: SS
Start: 2018-02-10 — End: 2018-04-08

## 2018-02-13 ENCOUNTER — Institutional Professional Consult (permissible substitution): Admit: 2018-02-13 | Discharge: 2018-02-14 | Payer: MEDICARE

## 2018-02-13 ENCOUNTER — Ambulatory Visit: Admit: 2018-02-13 | Discharge: 2018-02-14 | Payer: MEDICARE

## 2018-02-13 DIAGNOSIS — E1140 Type 2 diabetes mellitus with diabetic neuropathy, unspecified: Secondary | ICD-10-CM

## 2018-02-13 DIAGNOSIS — G603 Idiopathic progressive neuropathy: Secondary | ICD-10-CM

## 2018-02-13 DIAGNOSIS — N182 Chronic kidney disease, stage 2 (mild): Secondary | ICD-10-CM

## 2018-02-13 DIAGNOSIS — E1142 Type 2 diabetes mellitus with diabetic polyneuropathy: Secondary | ICD-10-CM

## 2018-02-13 DIAGNOSIS — B351 Tinea unguium: Secondary | ICD-10-CM

## 2018-02-13 DIAGNOSIS — C670 Malignant neoplasm of trigone of bladder: Secondary | ICD-10-CM

## 2018-02-13 DIAGNOSIS — E1169 Type 2 diabetes mellitus with other specified complication: Secondary | ICD-10-CM

## 2018-02-13 DIAGNOSIS — F17200 Nicotine dependence, unspecified, uncomplicated: Secondary | ICD-10-CM

## 2018-02-13 DIAGNOSIS — M4802 Spinal stenosis, cervical region: Secondary | ICD-10-CM

## 2018-02-13 DIAGNOSIS — E1149 Type 2 diabetes mellitus with other diabetic neurological complication: Secondary | ICD-10-CM

## 2018-02-13 DIAGNOSIS — M67912 Unspecified disorder of synovium and tendon, left shoulder: Secondary | ICD-10-CM

## 2018-02-13 DIAGNOSIS — I10 Essential (primary) hypertension: Principal | ICD-10-CM

## 2018-02-13 MED ORDER — OXYCODONE-ACETAMINOPHEN 5 MG-325 MG TABLET
ORAL_TABLET | 0 refills | 0 days | Status: CP
Start: 2018-02-13 — End: 2018-02-25

## 2018-02-13 MED ORDER — FOOD SUPPLEMENT, LACTOSE-REDUCED 0.05 GRAM-1.5 KCAL/ML ORAL LIQUID
11 refills | 0 days | Status: SS
Start: 2018-02-13 — End: 2018-04-08

## 2018-02-18 ENCOUNTER — Ambulatory Visit: Admit: 2018-02-18 | Discharge: 2018-02-18 | Payer: MEDICARE

## 2018-02-18 DIAGNOSIS — M25512 Pain in left shoulder: Secondary | ICD-10-CM

## 2018-02-18 DIAGNOSIS — G8929 Other chronic pain: Secondary | ICD-10-CM

## 2018-02-18 DIAGNOSIS — M24112 Other articular cartilage disorders, left shoulder: Principal | ICD-10-CM

## 2018-02-25 MED ORDER — OXYCODONE-ACETAMINOPHEN 5 MG-325 MG TABLET
ORAL_TABLET | 0 refills | 0 days | Status: CP
Start: 2018-02-25 — End: 2018-10-09

## 2018-03-05 MED ORDER — ALBUTEROL SULFATE HFA 90 MCG/ACTUATION AEROSOL INHALER
0 refills | 0 days | Status: CP
Start: 2018-03-05 — End: 2018-05-18

## 2018-03-11 ENCOUNTER — Ambulatory Visit: Admit: 2018-03-11 | Discharge: 2018-03-12 | Payer: MEDICARE

## 2018-03-11 DIAGNOSIS — M19012 Primary osteoarthritis, left shoulder: Principal | ICD-10-CM

## 2018-03-12 MED ORDER — MEDICAL SUPPLY ITEM
11 refills | 0 days | Status: SS
Start: 2018-03-12 — End: 2018-04-08

## 2018-03-19 ENCOUNTER — Ambulatory Visit: Admit: 2018-03-19 | Discharge: 2018-03-20 | Payer: MEDICARE

## 2018-03-19 DIAGNOSIS — F17200 Nicotine dependence, unspecified, uncomplicated: Secondary | ICD-10-CM

## 2018-03-19 DIAGNOSIS — F259 Schizoaffective disorder, unspecified: Secondary | ICD-10-CM

## 2018-03-19 DIAGNOSIS — M5412 Radiculopathy, cervical region: Principal | ICD-10-CM

## 2018-03-19 DIAGNOSIS — E0800 Diabetes mellitus due to underlying condition with hyperosmolarity without nonketotic hyperglycemic-hyperosmolar coma (NKHHC): Secondary | ICD-10-CM

## 2018-03-19 DIAGNOSIS — C679 Malignant neoplasm of bladder, unspecified: Secondary | ICD-10-CM

## 2018-03-19 DIAGNOSIS — M19012 Primary osteoarthritis, left shoulder: Secondary | ICD-10-CM

## 2018-03-19 DIAGNOSIS — M4802 Spinal stenosis, cervical region: Secondary | ICD-10-CM

## 2018-03-19 MED ORDER — BLOOD-GLUCOSE METER KIT
0 refills | 0 days | Status: CP
Start: 2018-03-19 — End: 2018-03-31

## 2018-03-19 MED ORDER — MELOXICAM 7.5 MG TABLET
ORAL_TABLET | Freq: Every day | ORAL | 3 refills | 0.00000 days | Status: CP
Start: 2018-03-19 — End: 2018-06-19

## 2018-03-19 MED ORDER — POLYETHYLENE GLYCOL 3350 17 GRAM ORAL POWDER PACKET
PACK | 1 refills | 0 days | Status: SS
Start: 2018-03-19 — End: 2018-04-08

## 2018-03-27 ENCOUNTER — Ambulatory Visit: Admit: 2018-03-27 | Discharge: 2018-03-28 | Payer: MEDICARE

## 2018-03-27 DIAGNOSIS — F259 Schizoaffective disorder, unspecified: Secondary | ICD-10-CM

## 2018-03-27 DIAGNOSIS — I10 Essential (primary) hypertension: Secondary | ICD-10-CM

## 2018-03-27 DIAGNOSIS — E1149 Type 2 diabetes mellitus with other diabetic neurological complication: Secondary | ICD-10-CM

## 2018-03-27 DIAGNOSIS — F17200 Nicotine dependence, unspecified, uncomplicated: Secondary | ICD-10-CM

## 2018-03-27 DIAGNOSIS — Z01818 Encounter for other preprocedural examination: Principal | ICD-10-CM

## 2018-03-27 DIAGNOSIS — M1990 Unspecified osteoarthritis, unspecified site: Secondary | ICD-10-CM

## 2018-03-27 DIAGNOSIS — M4800 Spinal stenosis, site unspecified: Secondary | ICD-10-CM

## 2018-03-27 DIAGNOSIS — J438 Other emphysema: Secondary | ICD-10-CM

## 2018-03-31 MED ORDER — LANCETS
Freq: Every day | 5 refills | 0 days | Status: CP
Start: 2018-03-31 — End: ?

## 2018-03-31 MED ORDER — BLOOD SUGAR DIAGNOSTIC STRIPS
ORAL_STRIP | Freq: Every day | 5 refills | 0 days | Status: CP
Start: 2018-03-31 — End: ?

## 2018-03-31 MED ORDER — BLOOD-GLUCOSE METER KIT
0 refills | 0 days | Status: CP
Start: 2018-03-31 — End: 2019-05-17

## 2018-04-04 DIAGNOSIS — M25512 Pain in left shoulder: Principal | ICD-10-CM

## 2018-04-08 ENCOUNTER — Encounter
Admit: 2018-04-08 | Discharge: 2018-04-10 | Disposition: A | Discharge status: Home Health | Payer: MEDICARE | Attending: Anesthesiology

## 2018-04-08 ENCOUNTER — Ambulatory Visit: Admit: 2018-04-08 | Discharge: 2018-04-10 | Disposition: A | Discharge status: Home Health | Payer: MEDICARE

## 2018-04-08 DIAGNOSIS — M25512 Pain in left shoulder: Principal | ICD-10-CM

## 2018-04-08 MED ORDER — OXYCODONE 5 MG TABLET
ORAL_TABLET | ORAL | 0 refills | 0 days | Status: CP | PRN
Start: 2018-04-08 — End: 2018-04-15

## 2018-04-08 MED ORDER — PROMETHAZINE 12.5 MG TABLET
ORAL_TABLET | Freq: Four times a day (QID) | ORAL | 0 refills | 0 days | Status: CP | PRN
Start: 2018-04-08 — End: 2018-04-10

## 2018-04-08 MED ORDER — ACETAMINOPHEN 500 MG TABLET
ORAL_TABLET | Freq: Three times a day (TID) | ORAL | 0 refills | 0.00000 days | Status: CP
Start: 2018-04-08 — End: 2018-04-22

## 2018-04-08 MED ORDER — ASPIRIN 81 MG CHEWABLE TABLET
ORAL_TABLET | Freq: Two times a day (BID) | ORAL | 0 refills | 0.00000 days | Status: CP
Start: 2018-04-08 — End: 2018-06-24

## 2018-04-08 MED ORDER — DOCUSATE SODIUM 100 MG CAPSULE
ORAL_CAPSULE | Freq: Two times a day (BID) | ORAL | 0 refills | 0 days | Status: CP | PRN
Start: 2018-04-08 — End: 2018-04-22

## 2018-04-22 ENCOUNTER — Ambulatory Visit: Admit: 2018-04-22 | Discharge: 2018-04-22 | Payer: MEDICARE

## 2018-04-22 ENCOUNTER — Ambulatory Visit: Admit: 2018-04-22 | Discharge: 2018-04-23 | Payer: MEDICARE

## 2018-04-22 DIAGNOSIS — M4802 Spinal stenosis, cervical region: Secondary | ICD-10-CM

## 2018-04-22 DIAGNOSIS — N182 Chronic kidney disease, stage 2 (mild): Secondary | ICD-10-CM

## 2018-04-22 DIAGNOSIS — Z9889 Other specified postprocedural states: Principal | ICD-10-CM

## 2018-04-22 DIAGNOSIS — C679 Malignant neoplasm of bladder, unspecified: Secondary | ICD-10-CM

## 2018-04-22 DIAGNOSIS — I10 Essential (primary) hypertension: Principal | ICD-10-CM

## 2018-04-22 DIAGNOSIS — E1149 Type 2 diabetes mellitus with other diabetic neurological complication: Secondary | ICD-10-CM

## 2018-04-22 DIAGNOSIS — G589 Mononeuropathy, unspecified: Secondary | ICD-10-CM

## 2018-05-15 ENCOUNTER — Ambulatory Visit: Admit: 2018-05-15 | Discharge: 2018-05-16 | Payer: MEDICARE

## 2018-05-15 DIAGNOSIS — Z9119 Patient's noncompliance with other medical treatment and regimen: Principal | ICD-10-CM

## 2018-05-19 MED ORDER — ALBUTEROL SULFATE HFA 90 MCG/ACTUATION AEROSOL INHALER
2 refills | 0 days | Status: CP
Start: 2018-05-19 — End: 2018-08-08

## 2018-05-28 MED ORDER — METFORMIN 500 MG TABLET
ORAL_TABLET | Freq: Every day | ORAL | 3 refills | 0 days | Status: CP
Start: 2018-05-28 — End: 2018-06-05

## 2018-06-05 ENCOUNTER — Ambulatory Visit: Admit: 2018-06-05 | Discharge: 2018-06-06 | Payer: MEDICARE

## 2018-06-05 DIAGNOSIS — Z9181 History of falling: Secondary | ICD-10-CM

## 2018-06-05 DIAGNOSIS — C670 Malignant neoplasm of trigone of bladder: Secondary | ICD-10-CM

## 2018-06-05 DIAGNOSIS — I10 Essential (primary) hypertension: Principal | ICD-10-CM

## 2018-06-05 DIAGNOSIS — M67912 Unspecified disorder of synovium and tendon, left shoulder: Secondary | ICD-10-CM

## 2018-06-05 DIAGNOSIS — F17200 Nicotine dependence, unspecified, uncomplicated: Secondary | ICD-10-CM

## 2018-06-05 DIAGNOSIS — E1149 Type 2 diabetes mellitus with other diabetic neurological complication: Secondary | ICD-10-CM

## 2018-06-05 DIAGNOSIS — N182 Chronic kidney disease, stage 2 (mild): Secondary | ICD-10-CM

## 2018-06-05 DIAGNOSIS — M150 Primary generalized (osteo)arthritis: Secondary | ICD-10-CM

## 2018-06-05 MED ORDER — GABAPENTIN 100 MG CAPSULE
ORAL_CAPSULE | 11 refills | 0 days | Status: CP
Start: 2018-06-05 — End: 2019-01-13

## 2018-06-10 MED ORDER — MEDICAL SUPPLY ITEM
11 refills | 0.00000 days | Status: CP
Start: 2018-06-10 — End: ?

## 2018-06-10 MED ORDER — MEDICAL SUPPLY ITEM: Units | 0 refills | 0 days

## 2018-06-19 ENCOUNTER — Ambulatory Visit: Admit: 2018-06-19 | Discharge: 2018-06-20 | Payer: MEDICARE | Attending: Family | Primary: Family

## 2018-06-19 DIAGNOSIS — M5412 Radiculopathy, cervical region: Principal | ICD-10-CM

## 2018-06-19 MED ORDER — MELOXICAM 7.5 MG TABLET
ORAL_TABLET | Freq: Every day | ORAL | 0 refills | 0.00000 days | Status: CP
Start: 2018-06-19 — End: 2019-04-08

## 2018-06-24 ENCOUNTER — Ambulatory Visit: Admit: 2018-06-24 | Discharge: 2018-06-24 | Payer: MEDICARE

## 2018-06-24 DIAGNOSIS — Z9889 Other specified postprocedural states: Principal | ICD-10-CM

## 2018-07-21 MED ORDER — MELOXICAM 7.5 MG TABLET
ORAL_TABLET | 3 refills | 0 days | Status: CP
Start: 2018-07-21 — End: ?

## 2018-07-23 ENCOUNTER — Ambulatory Visit: Admit: 2018-07-23 | Discharge: 2018-07-24 | Payer: MEDICARE

## 2018-07-23 DIAGNOSIS — E1149 Type 2 diabetes mellitus with other diabetic neurological complication: Secondary | ICD-10-CM

## 2018-07-23 DIAGNOSIS — J430 Unilateral pulmonary emphysema [MacLeod's syndrome]: Secondary | ICD-10-CM

## 2018-07-23 DIAGNOSIS — M5412 Radiculopathy, cervical region: Principal | ICD-10-CM

## 2018-07-23 DIAGNOSIS — M545 Low back pain: Secondary | ICD-10-CM

## 2018-07-23 DIAGNOSIS — N182 Chronic kidney disease, stage 2 (mild): Secondary | ICD-10-CM

## 2018-07-23 DIAGNOSIS — F17200 Nicotine dependence, unspecified, uncomplicated: Secondary | ICD-10-CM

## 2018-07-23 DIAGNOSIS — C679 Malignant neoplasm of bladder, unspecified: Secondary | ICD-10-CM

## 2018-07-23 DIAGNOSIS — M150 Primary generalized (osteo)arthritis: Secondary | ICD-10-CM

## 2018-07-23 DIAGNOSIS — I10 Essential (primary) hypertension: Secondary | ICD-10-CM

## 2018-07-23 MED ORDER — TIOTROPIUM BROMIDE 18 MCG CAPSULE WITH INHALATION DEVICE
11 refills | 0 days | Status: CP
Start: 2018-07-23 — End: ?

## 2018-08-04 ENCOUNTER — Emergency Department
Admission: EM | Admit: 2018-08-04 | Discharge: 2018-08-04 | Disposition: A | Discharge status: Home / Self Care | Payer: Medicare Other | Attending: Emergency Medicine | Admitting: Emergency Medicine

## 2018-08-04 ENCOUNTER — Other Ambulatory Visit: Payer: Self-pay

## 2018-08-04 ENCOUNTER — Encounter: Payer: Self-pay | Admitting: Emergency Medicine

## 2018-08-04 DIAGNOSIS — J449 Chronic obstructive pulmonary disease, unspecified: Secondary | ICD-10-CM | POA: Insufficient documentation

## 2018-08-04 DIAGNOSIS — E119 Type 2 diabetes mellitus without complications: Secondary | ICD-10-CM | POA: Diagnosis not present

## 2018-08-04 DIAGNOSIS — Z96612 Presence of left artificial shoulder joint: Secondary | ICD-10-CM | POA: Diagnosis not present

## 2018-08-04 DIAGNOSIS — Z79899 Other long term (current) drug therapy: Secondary | ICD-10-CM | POA: Diagnosis not present

## 2018-08-04 DIAGNOSIS — F17210 Nicotine dependence, cigarettes, uncomplicated: Secondary | ICD-10-CM | POA: Diagnosis not present

## 2018-08-04 DIAGNOSIS — M5413 Radiculopathy, cervicothoracic region: Secondary | ICD-10-CM | POA: Diagnosis not present

## 2018-08-04 DIAGNOSIS — Z7982 Long term (current) use of aspirin: Secondary | ICD-10-CM | POA: Insufficient documentation

## 2018-08-04 DIAGNOSIS — Z8551 Personal history of malignant neoplasm of bladder: Secondary | ICD-10-CM | POA: Diagnosis not present

## 2018-08-04 DIAGNOSIS — R200 Anesthesia of skin: Secondary | ICD-10-CM | POA: Diagnosis present

## 2018-08-04 DIAGNOSIS — I10 Essential (primary) hypertension: Secondary | ICD-10-CM | POA: Diagnosis not present

## 2018-08-04 LAB — GLUCOSE, CAPILLARY: Glucose-Capillary: 91 mg/dL (ref 70–99)

## 2018-08-04 MED ORDER — GABAPENTIN 100 MG PO CAPS: ORAL_CAPSULE | ORAL | 0 refills | Status: DC

## 2018-08-04 NOTE — ED Triage Notes (Signed)
Patient reports pain numbness to left hand and arm, on and off for several months. States she had surgery on left shoulder 2 months ago with no improvement of symptoms. Patient reports history of DM with neuropathy with similar symptoms.

## 2018-08-04 NOTE — ED Notes (Signed)
See triage note  Presents with a 3-4 weeks hx of "hurting" all over  Describes pain as "burning" pain goes from neck into arm  Min relief with Sherri Sear

## 2018-08-04 NOTE — ED Provider Notes (Signed)
[facility] Emergency Department Provider Note   ____________________________________________   First MD Initiated Contact with Patient 08/04/18 1410     (approximate)  I have reviewed the triage vital signs and the nursing notes.   HISTORY  Chief Complaint Numbness   HPI Brittany Hernandez is a 77 y.o. female is here with complaint of a "numbness pain" in her left hand and arm for approximately 4 months.  Patient states that she had surgery on her left shoulder 4 months ago and has had no improvement of her symptoms.  She states that she was seen for follow-up once with the surgeon and is not scheduled to go back until September.  She states no over-the-counter medication has helped with this and describes her pain as a "burning" and "crawling" sensation.  Patient denies any other symptoms.  There is been no chest pain, nausea, vomiting, shortness of breath, fever or chills.  Patient states that it is worse at night when she is trying to sleep.  She states the crawling sensation is across her left shoulder and then goes down to her wrist.  She states that this was one the reasons why she had surgery initially.  She rates her pain as an 8 out of 10.   Past Medical History:  Diagnosis Date  . Bladder cancer [facility])    s/p cystectomy    Patient Active Problem List   Diagnosis Date Noted  . COPD (chronic obstructive pulmonary disease) with emphysema ([place]) 05/08/2015  . Severe recurrent depression with psychosis ([place]) 05/08/2015  . Schizoaffective disorder ([place]) 05/08/2015  . HTN (hypertension) 05/05/2015  . DM (diabetes mellitus), type 2 with neurological complications ([place]) 37/16/9678    Past Surgical History:  Procedure Laterality Date  . ABDOMINAL HYSTERECTOMY    . CHOLECYSTECTOMY    . CYSTECTOMY W/ CONTINENT DIVERSION Right   . URETEROSTOMY Right     Prior to Admission medications   Medication Sig Start Date End Date Taking? Authorizing Provider    amLODipine (NORVASC) 5 MG tablet Take 10 mg by mouth daily. Takes 2 5mg  tabs    [provider]  aspirin EC 81 MG tablet Take 81 mg by mouth daily.    [provider]  DULoxetine (CYMBALTA) 30 MG capsule Take 30 mg by mouth daily.    [provider]  fluPHENAZine decanoate (PROLIXIN) 25 MG/ML injection  04/13/15   [provider]  furosemide (LASIX) 20 MG tablet Take 20 mg by mouth daily.    [provider]  gabapentin (NEURONTIN) 100 MG capsule 1 capsule today and then beginning tomorrow take 1 capsule twice a day. 08/04/18   Johnn Hai, PA-C  meclizine (ANTIVERT) 12.5 MG tablet Take 1 tablet (12.5 mg total) by mouth 2 (two) times daily as needed for dizziness. 05/08/15   Aldean Jewett, MD  tiotropium (SPIRIVA) 18 MCG inhalation capsule Place 1 capsule (18 mcg total) into inhaler and inhale daily. 05/08/15   Aldean Jewett, MD    Allergies Lisinopril  Family History  Problem Relation Age of Onset  . Diabetes Mellitus II Other   . CAD Other     Social History Social History   Tobacco Use  . Smoking status: Current Every Day Smoker    Packs/day: 1.00    Years: 50.00    Pack years: 50.00    Types: Cigarettes  Substance Use Topics  . Alcohol use: No  . Drug use: No    Review of  Systems Constitutional: No fever/chills Cardiovascular: Denies chest pain. Respiratory: Denies shortness of breath. Musculoskeletal: Left shoulder pain with radiculopathy for more than 3 months. Skin: Negative for rash. Neurological: Negative for headaches, focal weakness or numbness. ____________________________________________   PHYSICAL EXAM:  VITAL SIGNS: ED Triage Vitals  Enc Vitals Group     BP 08/04/18 1330 (!) 147/91     Pulse Rate 08/04/18 1330 61     Resp 08/04/18 1330 18     Temp 08/04/18 1330 98.4 F (36.9 C)     Temp Source 08/04/18 1330 Oral     SpO2 08/04/18 1330 98 %     Weight 08/04/18 1331 150 lb (68 kg)     Height  08/04/18 1331 5\' 1"  (1.549 m)     Head Circumference --      Peak Flow --      Pain Score 08/04/18 1331 8     Pain Loc --      Pain Edu? --      Excl. in [place]? --    Constitutional: Alert and oriented. Well appearing and in no acute distress. Eyes: Conjunctivae are normal.  Head: Atraumatic. Nose: No congestion/rhinnorhea. Neck: No stridor.   Cardiovascular: Normal rate, regular rhythm. Grossly normal heart sounds.  Good peripheral circulation. Respiratory: Normal respiratory effort.  No retractions. Lungs CTAB. Musculoskeletal: Examination of left shoulder there is no gross deformity however there is tenderness to very light touch on the left trapezius muscle.  Range of motion with shoulder is without restriction or crepitus.  Skin is without injury or discoloration.  There is no point tenderness on palpation of the cervical spine.  Good muscle strength bilaterally.  Patient is ambulatory without any assistance other than her cane that she is caring. Neurologic:  Normal speech and language. No gross focal neurologic deficits are appreciated.  Skin:  Skin is warm, dry and intact.  Psychiatric: Mood and affect are normal. Speech and behavior are normal.  ____________________________________________   LABS (all labs ordered are listed, but only abnormal results are displayed)  Labs Reviewed  GLUCOSE, CAPILLARY    PROCEDURES  Procedure(s) performed: None  Procedures  Critical Care performed: No  ____________________________________________   INITIAL IMPRESSION / ASSESSMENT AND PLAN / ED COURSE  As part of my medical decision making, I reviewed the following data within the electronic MEDICAL RECORD NUMBER Notes from prior ED visits and [place] Controlled Substance Database  Patient nonfasting glucose was 91.  Patient records indicate that patient had a total shoulder replacement.  Looking through her records it does not appear that she has been on any Neurontin.  Patient was given a  prescription to begin taking as this will not cause drowsiness and increase her risk for falling.  She is encouraged to follow-up with her surgeon in the next 1 to 2 weeks.  Patient was also made aware that she can follow-up with her PCP if she is unable to see her surgeon before September.   ____________________________________________   FINAL CLINICAL IMPRESSION(S) / ED DIAGNOSES  Final diagnoses:  Radiculopathy of cervicothoracic region     ED Discharge Orders        Ordered    gabapentin (NEURONTIN) 100 MG capsule     08/04/18 1436       Note:  This document was prepared using Dragon voice recognition software and may include unintentional dictation errors.    Johnn Hai, PA-C 08/04/18 1508    Carrie Mew, MD 08/05/18 2045

## 2018-08-04 NOTE — Discharge Instructions (Signed)
Call make an appointment with your primary care doctor in 1 to 2 weeks.  Begin taking gabapentin 100 mg 1 tablet today and then tomorrow begin taking 1 tablet twice a day.  Take this medication with you when you see your primary care provider.

## 2018-08-11 MED ORDER — ALBUTEROL SULFATE HFA 90 MCG/ACTUATION AEROSOL INHALER
2 refills | 0 days | Status: CP
Start: 2018-08-11 — End: 2019-06-29

## 2018-08-26 ENCOUNTER — Ambulatory Visit: Admit: 2018-08-26 | Discharge: 2018-08-27 | Payer: MEDICARE

## 2018-08-26 DIAGNOSIS — M19012 Primary osteoarthritis, left shoulder: Principal | ICD-10-CM

## 2018-08-26 MED ORDER — DICLOFENAC POTASSIUM 50 MG TABLET
ORAL_TABLET | Freq: Three times a day (TID) | ORAL | 0 refills | 0 days | Status: CP
Start: 2018-08-26 — End: 2018-10-09

## 2018-09-02 ENCOUNTER — Ambulatory Visit: Admit: 2018-09-02 | Discharge: 2018-09-03 | Payer: MEDICARE

## 2018-09-02 DIAGNOSIS — G589 Mononeuropathy, unspecified: Secondary | ICD-10-CM

## 2018-09-02 DIAGNOSIS — N182 Chronic kidney disease, stage 2 (mild): Secondary | ICD-10-CM

## 2018-09-02 DIAGNOSIS — J438 Other emphysema: Secondary | ICD-10-CM

## 2018-09-02 DIAGNOSIS — I10 Essential (primary) hypertension: Principal | ICD-10-CM

## 2018-09-02 DIAGNOSIS — C673 Malignant neoplasm of anterior wall of bladder: Secondary | ICD-10-CM

## 2018-09-02 DIAGNOSIS — Q845 Enlarged and hypertrophic nails: Secondary | ICD-10-CM

## 2018-09-02 DIAGNOSIS — E1149 Type 2 diabetes mellitus with other diabetic neurological complication: Secondary | ICD-10-CM

## 2018-09-02 DIAGNOSIS — M4802 Spinal stenosis, cervical region: Secondary | ICD-10-CM

## 2018-09-02 DIAGNOSIS — F17200 Nicotine dependence, unspecified, uncomplicated: Secondary | ICD-10-CM

## 2018-09-02 DIAGNOSIS — M150 Primary generalized (osteo)arthritis: Secondary | ICD-10-CM

## 2018-09-03 ENCOUNTER — Other Ambulatory Visit: Payer: Self-pay | Admitting: Emergency Medicine

## 2018-09-19 ENCOUNTER — Ambulatory Visit
Admit: 2018-09-19 | Discharge: 2018-09-19 | Payer: MEDICARE | Attending: Physical Medicine & Rehabilitation | Primary: Physical Medicine & Rehabilitation

## 2018-09-19 ENCOUNTER — Ambulatory Visit: Admit: 2018-09-19 | Discharge: 2018-09-19 | Payer: MEDICARE

## 2018-09-19 DIAGNOSIS — M545 Low back pain: Principal | ICD-10-CM

## 2018-09-30 MED ORDER — FUROSEMIDE 20 MG TABLET
ORAL_TABLET | 2 refills | 0 days | Status: CP
Start: 2018-09-30 — End: 2019-07-10

## 2018-10-09 ENCOUNTER — Ambulatory Visit: Admit: 2018-10-09 | Discharge: 2018-10-10 | Payer: MEDICARE

## 2018-10-09 DIAGNOSIS — F17200 Nicotine dependence, unspecified, uncomplicated: Secondary | ICD-10-CM

## 2018-10-09 DIAGNOSIS — I10 Essential (primary) hypertension: Secondary | ICD-10-CM

## 2018-10-09 DIAGNOSIS — M48061 Spinal stenosis, lumbar region without neurogenic claudication: Principal | ICD-10-CM

## 2018-10-09 DIAGNOSIS — E1149 Type 2 diabetes mellitus with other diabetic neurological complication: Secondary | ICD-10-CM

## 2018-10-09 DIAGNOSIS — J438 Other emphysema: Secondary | ICD-10-CM

## 2018-10-09 DIAGNOSIS — F250 Schizoaffective disorder, bipolar type: Secondary | ICD-10-CM

## 2018-10-09 MED ORDER — VARENICLINE 0.5 MG TABLET
ORAL_TABLET | 5 refills | 0 days | Status: CP
Start: 2018-10-09 — End: ?

## 2018-10-09 MED ORDER — LAMOTRIGINE 25 MG TABLET
ORAL_TABLET | Freq: Two times a day (BID) | ORAL | 5 refills | 0 days | Status: CP
Start: 2018-10-09 — End: 2018-11-25

## 2018-11-25 ENCOUNTER — Ambulatory Visit: Admit: 2018-11-25 | Discharge: 2018-11-26 | Payer: MEDICARE

## 2018-11-25 DIAGNOSIS — F251 Schizoaffective disorder, depressive type: Secondary | ICD-10-CM

## 2018-11-25 DIAGNOSIS — N182 Chronic kidney disease, stage 2 (mild): Secondary | ICD-10-CM

## 2018-11-25 DIAGNOSIS — G609 Hereditary and idiopathic neuropathy, unspecified: Secondary | ICD-10-CM

## 2018-11-25 DIAGNOSIS — E1149 Type 2 diabetes mellitus with other diabetic neurological complication: Secondary | ICD-10-CM

## 2018-11-25 DIAGNOSIS — I10 Essential (primary) hypertension: Principal | ICD-10-CM

## 2018-11-25 DIAGNOSIS — C670 Malignant neoplasm of trigone of bladder: Secondary | ICD-10-CM

## 2018-11-25 DIAGNOSIS — M4802 Spinal stenosis, cervical region: Secondary | ICD-10-CM

## 2018-11-25 DIAGNOSIS — F17200 Nicotine dependence, unspecified, uncomplicated: Secondary | ICD-10-CM

## 2018-11-25 MED ORDER — TOPIRAMATE 25 MG TABLET
ORAL_TABLET | Freq: Every evening | ORAL | 2 refills | 0.00000 days | Status: CP
Start: 2018-11-25 — End: 2019-06-03

## 2018-12-05 ENCOUNTER — Ambulatory Visit
Admit: 2018-12-05 | Discharge: 2018-12-06 | Payer: MEDICARE | Attending: Neurological Surgery | Primary: Neurological Surgery

## 2018-12-05 DIAGNOSIS — M4316 Spondylolisthesis, lumbar region: Secondary | ICD-10-CM

## 2018-12-05 DIAGNOSIS — M48061 Spinal stenosis, lumbar region without neurogenic claudication: Secondary | ICD-10-CM

## 2018-12-05 DIAGNOSIS — M5126 Other intervertebral disc displacement, lumbar region: Secondary | ICD-10-CM

## 2018-12-05 DIAGNOSIS — M4712 Other spondylosis with myelopathy, cervical region: Principal | ICD-10-CM

## 2018-12-10 ENCOUNTER — Ambulatory Visit: Admit: 2018-12-10 | Discharge: 2018-12-10 | Payer: MEDICARE

## 2018-12-10 DIAGNOSIS — C670 Malignant neoplasm of trigone of bladder: Principal | ICD-10-CM

## 2018-12-10 DIAGNOSIS — C679 Malignant neoplasm of bladder, unspecified: Secondary | ICD-10-CM

## 2018-12-10 DIAGNOSIS — E1149 Type 2 diabetes mellitus with other diabetic neurological complication: Secondary | ICD-10-CM

## 2019-01-08 ENCOUNTER — Ambulatory Visit: Admit: 2019-01-08 | Discharge: 2019-01-09 | Payer: MEDICARE

## 2019-01-08 DIAGNOSIS — E1149 Type 2 diabetes mellitus with other diabetic neurological complication: Principal | ICD-10-CM

## 2019-01-08 DIAGNOSIS — I10 Essential (primary) hypertension: Secondary | ICD-10-CM

## 2019-01-08 DIAGNOSIS — H9312 Tinnitus, left ear: Secondary | ICD-10-CM

## 2019-01-13 ENCOUNTER — Ambulatory Visit: Admit: 2019-01-13 | Discharge: 2019-01-14 | Payer: MEDICARE

## 2019-01-13 DIAGNOSIS — C675 Malignant neoplasm of bladder neck: Secondary | ICD-10-CM

## 2019-01-13 DIAGNOSIS — N182 Chronic kidney disease, stage 2 (mild): Secondary | ICD-10-CM

## 2019-01-13 DIAGNOSIS — F17200 Nicotine dependence, unspecified, uncomplicated: Secondary | ICD-10-CM

## 2019-01-13 DIAGNOSIS — F251 Schizoaffective disorder, depressive type: Secondary | ICD-10-CM

## 2019-01-13 DIAGNOSIS — H9319 Tinnitus, unspecified ear: Principal | ICD-10-CM

## 2019-01-13 MED ORDER — GABAPENTIN 100 MG CAPSULE
ORAL_CAPSULE | 11 refills | 0 days | Status: CP
Start: 2019-01-13 — End: 2020-01-13

## 2019-01-26 ENCOUNTER — Other Ambulatory Visit: Payer: Self-pay

## 2019-01-26 ENCOUNTER — Emergency Department: Payer: Medicare Other

## 2019-01-26 ENCOUNTER — Encounter: Payer: Self-pay | Admitting: Emergency Medicine

## 2019-01-26 ENCOUNTER — Emergency Department
Admission: EM | Admit: 2019-01-26 | Discharge: 2019-01-26 | Disposition: A | Discharge status: Home / Self Care | Payer: Medicare Other | Attending: Emergency Medicine | Admitting: Emergency Medicine

## 2019-01-26 DIAGNOSIS — R42 Dizziness and giddiness: Secondary | ICD-10-CM | POA: Diagnosis present

## 2019-01-26 DIAGNOSIS — N3000 Acute cystitis without hematuria: Secondary | ICD-10-CM | POA: Diagnosis not present

## 2019-01-26 DIAGNOSIS — R55 Syncope and collapse: Secondary | ICD-10-CM | POA: Insufficient documentation

## 2019-01-26 DIAGNOSIS — N289 Disorder of kidney and ureter, unspecified: Secondary | ICD-10-CM | POA: Diagnosis not present

## 2019-01-26 DIAGNOSIS — F17210 Nicotine dependence, cigarettes, uncomplicated: Secondary | ICD-10-CM | POA: Insufficient documentation

## 2019-01-26 DIAGNOSIS — Z936 Other artificial openings of urinary tract status: Secondary | ICD-10-CM | POA: Diagnosis not present

## 2019-01-26 DIAGNOSIS — J449 Chronic obstructive pulmonary disease, unspecified: Secondary | ICD-10-CM | POA: Insufficient documentation

## 2019-01-26 DIAGNOSIS — H9313 Tinnitus, bilateral: Secondary | ICD-10-CM | POA: Insufficient documentation

## 2019-01-26 DIAGNOSIS — I10 Essential (primary) hypertension: Secondary | ICD-10-CM | POA: Insufficient documentation

## 2019-01-26 DIAGNOSIS — Z8551 Personal history of malignant neoplasm of bladder: Secondary | ICD-10-CM | POA: Insufficient documentation

## 2019-01-26 DIAGNOSIS — Z906 Acquired absence of other parts of urinary tract: Secondary | ICD-10-CM | POA: Insufficient documentation

## 2019-01-26 DIAGNOSIS — E119 Type 2 diabetes mellitus without complications: Secondary | ICD-10-CM | POA: Diagnosis not present

## 2019-01-26 HISTORY — DX: Essential (primary) hypertension: I10

## 2019-01-26 HISTORY — DX: Type 2 diabetes mellitus without complications: E11.9

## 2019-01-26 LAB — URINALYSIS, COMPLETE (UACMP) WITH MICROSCOPIC
BILIRUBIN URINE: NEGATIVE
GLUCOSE, UA: NEGATIVE mg/dL
Ketones, ur: NEGATIVE mg/dL
Nitrite: POSITIVE — AB
PROTEIN: NEGATIVE mg/dL
Specific Gravity, Urine: 1.009 (ref 1.005–1.030)
pH: 7 (ref 5.0–8.0)

## 2019-01-26 LAB — COMPREHENSIVE METABOLIC PANEL
ALBUMIN: 3.9 g/dL (ref 3.5–5.0)
ALK PHOS: 60 U/L (ref 38–126)
ALT: 10 U/L (ref 0–44)
ANION GAP: 6 (ref 5–15)
AST: 15 U/L (ref 15–41)
BUN: 23 mg/dL (ref 8–23)
CO2: 24 mmol/L (ref 22–32)
CREATININE: 1.18 mg/dL — AB (ref 0.44–1.00)
Calcium: 9.3 mg/dL (ref 8.9–10.3)
Chloride: 108 mmol/L (ref 98–111)
GFR calc Af Amer: 52 mL/min — ABNORMAL LOW (ref >60)
GFR calc non Af Amer: 45 mL/min — ABNORMAL LOW (ref >60)
GLUCOSE: 103 mg/dL — AB (ref 70–99)
Potassium: 3.7 mmol/L (ref 3.5–5.1)
Sodium: 138 mmol/L (ref 135–145)
Total Bilirubin: 0.5 mg/dL (ref 0.3–1.2)
Total Protein: 7.5 g/dL (ref 6.5–8.1)

## 2019-01-26 LAB — CBC
HEMATOCRIT: 36.9 % (ref 36.0–46.0)
Hemoglobin: 11.8 g/dL — ABNORMAL LOW (ref 12.0–15.0)
MCH: 29.6 pg (ref 26.0–34.0)
MCHC: 32 g/dL (ref 30.0–36.0)
MCV: 92.7 fL (ref 80.0–100.0)
Platelets: 274 10*3/uL (ref 150–400)
RBC: 3.98 MIL/uL (ref 3.87–5.11)
RDW: 15.1 % (ref 11.5–15.5)
WBC: 5.9 10*3/uL (ref 4.0–10.5)
nRBC: 0 % (ref 0.0–0.2)

## 2019-01-26 LAB — APTT: aPTT: 32 seconds (ref 24–36)

## 2019-01-26 LAB — DIFFERENTIAL
ABS IMMATURE GRANULOCYTES: 0.02 10*3/uL (ref 0.00–0.07)
Basophils Absolute: 0 10*3/uL (ref 0.0–0.1)
Basophils Relative: 1 %
EOS PCT: 5 %
Eosinophils Absolute: 0.3 10*3/uL (ref 0.0–0.5)
IMMATURE GRANULOCYTES: 0 %
Lymphocytes Relative: 32 %
Lymphs Abs: 1.9 10*3/uL (ref 0.7–4.0)
Monocytes Absolute: 0.5 10*3/uL (ref 0.1–1.0)
Monocytes Relative: 8 %
Neutro Abs: 3.2 10*3/uL (ref 1.7–7.7)
Neutrophils Relative %: 54 %

## 2019-01-26 LAB — PROTIME-INR
INR: 1
PROTHROMBIN TIME: 13.1 s (ref 11.4–15.2)

## 2019-01-26 LAB — TROPONIN I

## 2019-01-26 LAB — GLUCOSE, CAPILLARY: Glucose-Capillary: 107 mg/dL — ABNORMAL HIGH (ref 70–99)

## 2019-01-26 MED ORDER — IOHEXOL 350 MG/ML SOLN
75.0000 mL | Freq: Once | INTRAVENOUS | Status: AC | PRN
  Administered 2019-01-26: 75 mL via INTRAVENOUS

## 2019-01-26 MED ORDER — SODIUM CHLORIDE 0.9% FLUSH: 3.0000 mL | Freq: Once | INTRAVENOUS | Status: DC

## 2019-01-26 MED ORDER — SODIUM CHLORIDE 0.9 % IV BOLUS
1000.0000 mL | Freq: Once | INTRAVENOUS | Status: AC
  Administered 2019-01-26: 1000 mL via INTRAVENOUS

## 2019-01-26 MED ORDER — CEPHALEXIN 500 MG PO CAPS: 500.0000 mg | ORAL_CAPSULE | Freq: Four times a day (QID) | ORAL | 0 refills | Status: AC

## 2019-01-26 MED ORDER — SODIUM CHLORIDE 0.9 % IV SOLN
1.0000 g | Freq: Once | INTRAVENOUS | Status: AC
  Administered 2019-01-26: 1 g via INTRAVENOUS
  Filled 2019-01-26: qty 10

## 2019-01-26 NOTE — ED Notes (Signed)
Took pt for scan.

## 2019-01-26 NOTE — ED Notes (Signed)
Sent rainbow to lab. 

## 2019-01-26 NOTE — ED Triage Notes (Addendum)
"  Buzzing" in ears, headache and dizzy x 3 weeks. Denies falling. Denies use of blood thinners. Denies ear pain. States these symptoms are intermittent and she does not have them now.

## 2019-01-26 NOTE — ED Triage Notes (Signed)
First Nurse Note:  Arrives c/o "buzzing in ear and dizziness" x 3 weeks.  AAOx3.  Skin warm and dry. MAE equally and strong.  NAD

## 2019-01-26 NOTE — ED Notes (Signed)

## 2019-01-26 NOTE — ED Provider Notes (Signed)
[facility] Emergency Department Provider Note  ____________________________________________  Time seen: Approximately 4:24 PM  I have reviewed the triage vital signs and the nursing notes.   HISTORY  Chief Complaint Headache; Dizziness; and Tinnitus    HPI Brittany Hernandez is a 78 y.o. female w/ a hx of bladder ca s/p cystectomy, HTN, DM, presenting for 3 weeks of lightheadedness with standing, and intermittent buzzing sensation in the right ear, sometimes the left ear. She has had several days of l feeling like she might faint when she stands up, with resolution with sitting down.  This does not all occur all the time, and she is not having that sensation right now.  She has had a cough with phlegm, with congestion and rhinorrhea but no sore throat, ear pain, fevers or chills.  She has had some loose stool but no nausea or vomiting, no change in her cystostomy output, no abdominal pain.  She has not had any visual changes, speech changes, mental status changes or new numbness tingling or weakness.  No recent changes in medications or falls.  Past Medical History:  Diagnosis Date  . Bladder cancer [facility])    s/p cystectomy  . Diabetes mellitus without complication ([place])   . Hypertension     Patient Active Problem List   Diagnosis Date Noted  . COPD (chronic obstructive pulmonary disease) with emphysema ([place]) 05/08/2015  . Severe recurrent depression with psychosis ([place]) 05/08/2015  . Schizoaffective disorder ([place]) 05/08/2015  . HTN (hypertension) 05/05/2015  . DM (diabetes mellitus), type 2 with neurological complications ([place]) 50/93/2671    Past Surgical History:  Procedure Laterality Date  . ABDOMINAL HYSTERECTOMY    . CHOLECYSTECTOMY    . CYSTECTOMY W/ CONTINENT DIVERSION Right   . URETEROSTOMY Right     Current Outpatient Rx  . Order #: [id number] Class: Historical Med  . Order #: [id number] Class: Historical Med  . Order #: [id number] Class: Historical  Med  . Order #: [id number] Class: Historical Med  . Order #: [id number] Class: Historical Med  . Order #: [id number] Class: Normal  . Order #: [id number] Class: Print  . Order #: [id number] Class: Print    Allergies Lisinopril  Family History  Problem Relation Age of Onset  . Diabetes Mellitus II Other   . CAD Other     Social History Social History   Tobacco Use  . Smoking status: Current Every Day Smoker    Packs/day: 1.00    Years: 50.00    Pack years: 50.00    Types: Cigarettes  Substance Use Topics  . Alcohol use: No  . Drug use: No    Review of Systems Constitutional: No fever/chills.  Positive lightheadedness and presyncope.  No presyncope.  No diaphoresis. Eyes: No visual changes.  No blurred or double vision. ENT: No sore throat.  Positive congestion and rhinorrhea.  Positive tinnitus, intermittently of both ears. Cardiovascular: Denies chest pain. Denies palpitations. Respiratory: Denies shortness of breath.  No cough. Gastrointestinal: No abdominal pain.  No nausea, no vomiting.  Positive diarrhea.  No constipation. Genitourinary: Negative for dysuria. Musculoskeletal: Negative for back pain.  No lower extremity swelling or calf pain. Skin: Negative for rash. Neurological: Positive for intermittent headaches, worsened by Goody's powder; she has had these headaches for years and they are not changed in character or severity.  No new numbness tingling or weakness.  Positive lightheadedness with standing; no room spinning sensation.  No new changes in vision, speech or mental status.    ____________________________________________   PHYSICAL  EXAM:  VITAL SIGNS: ED Triage Vitals  Enc Vitals Group     BP 01/26/19 1255 (!) 100/51     Pulse Rate 01/26/19 1255 75     Resp 01/26/19 1255 18     Temp 01/26/19 1255 98.2 F (36.8 C)     Temp Source 01/26/19 1255 Oral     SpO2 01/26/19 1255 100 %     Weight 01/26/19 1256 135 lb (61.2 kg)     Height 01/26/19 1256 5\' 3"   (1.6 m)     Head Circumference --      Peak Flow --      Pain Score 01/26/19 1256 0     Pain Loc --      Pain Edu? --      Excl. in [place]? --     Constitutional: Alert and oriented.  Answers questions appropriately.  Chronically ill-appearing.  GCS is 15. Eyes: Conjunctivae are normal.  EOMI. PERRLA.  No scleral icterus.  No eye discharge. Head: Atraumatic. Nose: No congestion/rhinnorhea. EARS: TMs are clear without any bulge, fluid or erythema bilaterally.  The right canal has a minimal amount of cerumen without any evidence of foreign body.  The left canal is clear. Mouth/Throat: Mucous membranes are moist.  Neck: No stridor.  Supple.  No JVD.  No meningismus. Cardiovascular: Normal rate, regular rhythm. No murmurs, rubs or gallops.  Respiratory: Normal respiratory effort.  No accessory muscle use or retractions.  Rales in the right lung base.  No wheezes or rhonchi. Gastrointestinal: Soft, nontender and nondistended.  No guarding or rebound.  No peritoneal signs.  Left lower quadrant cystostomy bag with clear yellow urine. Musculoskeletal: No LE edema. No ttp in the calves or palpable cords.  Negative Homan's sign. Neurologic: Alert and oriented. Speech is clear. Face and smile symmetric. Tongue is midline.  EOMI.  PERRLA.  No vertical or horizontal nystagmus.. No pronator drift. 5 out of 5 grip, biceps, triceps, hip flexors, plantar flexion and dorsiflexion. Normal sensation to light touch in the bilateral upper and lower extremities, and face.  Patient is able to ambulate without ataxia. Skin:  Skin is warm, dry and intact. No rash noted. Psychiatric: Mood and affect are normal. Speech and behavior are normal.  Normal judgement.  ____________________________________________   LABS (all labs ordered are listed, but only abnormal results are displayed)  Labs Reviewed  CBC - Abnormal; Notable for the following components:      Result Value   Hemoglobin 11.8 (*)    All other components  within normal limits  COMPREHENSIVE METABOLIC PANEL - Abnormal; Notable for the following components:   Glucose, Bld 103 (*)    Creatinine, Ser 1.18 (*)    GFR calc non Af Amer 45 (*)    GFR calc Af Amer 52 (*)    All other components within normal limits  GLUCOSE, CAPILLARY - Abnormal; Notable for the following components:   Glucose-Capillary 107 (*)    All other components within normal limits  PROTIME-INR  APTT  DIFFERENTIAL  TROPONIN I  URINALYSIS, COMPLETE (UACMP) WITH MICROSCOPIC  CBG MONITORING, ED   ____________________________________________  EKG  ED ECG REPORT I, Anne-Caroline Mariea Clonts, the attending physician, personally viewed and interpreted this ECG.   Date: 01/26/2019  EKG Time: 1518  Rate: 75  Rhythm: normal sinus rhythm  Axis: normal  Intervals:none  ST&T Change: No STEMI  ____________________________________________  RADIOLOGY  Ct Head Wo Contrast  Result Date: 01/26/2019 CLINICAL DATA:  Tinnitus. Headache. Dizziness. Three  weeks duration. EXAM: CT HEAD WITHOUT CONTRAST TECHNIQUE: Contiguous axial images were obtained from the base of the skull through the vertex without intravenous contrast. COMPARISON:  05/07/2015. 05/05/2015. FINDINGS: Brain: The brain shows a normal appearance without evidence of malformation, atrophy, old or acute small or large vessel infarction, mass lesion, hemorrhage, hydrocephalus or extra-axial collection. Vascular: There is atherosclerotic calcification of the major vessels at the base of the brain. Skull: Normal. No traumatic finding. No focal bone lesion. Sinuses/Orbits: Sinuses are clear. Orbits appear normal. Mastoids are clear. Other: None significant IMPRESSION: Normal head CT except for atherosclerotic calcification of the major vessels at the base of the brain. Electronically Signed   By: Nelson Chimes M.D.   On: 01/26/2019 13:26    ____________________________________________   PROCEDURES  Procedure(s) performed:  None  Procedures  Critical Care performed: No ____________________________________________   INITIAL IMPRESSION / ASSESSMENT AND PLAN / ED COURSE  Pertinent labs & imaging results that were available during my care of the patient were reviewed by me and considered in my medical decision making (see chart for details).  78 y.o. female w/ hx of bladder ca s/p cystectomy, HTN, DM, presenting w/ 3 wks of intermittent bilateral tinnitis and [place] lightheadedness and presyncope.  Overall, the patient is hemodynamically stable and I have performed orthostatic vital signs on her which have been reassuring.  She does not look dehydrated on my exam.  We will check for anemia or electrolyte disturbance.  We will also look for UTI although the urine in her cystostomy bag is clear.  M concerned about rales in the patient's right lung base, I will evaluate for a pneumonia.  In addition, the patient CT scan does not show any acute process, but given her tinnitus and lightheadedness, will get a CT angiogram to rule out posterior stroke.  Plan reevaluation for final disposition.  ----------------------------------------- 4:34 PM on 01/26/2019 -----------------------------------------  Patient has a mild bump in her creatinine at 1.18; even though she is not orthostatic, may be she is a little dehydrated.  We will treat her with intravenous fluids.  ----------------------------------------- 8:55 PM on 01/26/2019 -----------------------------------------  The patient's work-up in the emergency department from a neurologic standpoint has been reassuring.  Her CT brain shows no acute intracranial process and the CT angiogram of her head neck shows patent vessels without any abnormalities.  Her urinalysis does show UTI and she has been treated with Rocephin.  I will plan to discharge her home with Keflex.  She has some mild renal insufficiency and has received intravenous fluids here.  She will be instructed  drink plenty of fluids at home.  Her follow-up will be close with her PMD and return precautions were discussed.  At this time, the patient is hemodynamically stable and feeling better than upon arrival.  Plan discharge. ____________________________________________  FINAL CLINICAL IMPRESSION(S) / ED DIAGNOSES  Final diagnoses:  Tinnitus aurium, bilateral  Lightheadedness  Renal insufficiency         NEW MEDICATIONS STARTED DURING THIS VISIT:  New Prescriptions   No medications on file      Eula Listen, MD 01/26/19 2056

## 2019-01-26 NOTE — Discharge Instructions (Addendum)
Drink plenty of fluid to stay well-hydrated.  Please take the entire course of antibiotics even if you are feeling better.  Make sure you take all precautions to prevent falls in your home.  Return to the emergency department if you develop severe pain, lightheadedness or fainting, fever, vomiting, or any other symptoms concerning to you.

## 2019-01-26 NOTE — ED Notes (Signed)
CT notified pt has IV in place.  

## 2019-01-28 LAB — URINE CULTURE

## 2019-03-10 ENCOUNTER — Ambulatory Visit: Admit: 2019-03-10 | Discharge: 2019-03-11 | Payer: MEDICARE

## 2019-03-10 DIAGNOSIS — I10 Essential (primary) hypertension: Principal | ICD-10-CM

## 2019-03-10 DIAGNOSIS — Z9289 Personal history of other medical treatment: Principal | ICD-10-CM

## 2019-03-10 DIAGNOSIS — K469 Unspecified abdominal hernia without obstruction or gangrene: Principal | ICD-10-CM

## 2019-03-10 DIAGNOSIS — J439 Emphysema, unspecified: Principal | ICD-10-CM

## 2019-03-10 DIAGNOSIS — M4802 Spinal stenosis, cervical region: Principal | ICD-10-CM

## 2019-03-10 DIAGNOSIS — E1149 Type 2 diabetes mellitus with other diabetic neurological complication: Principal | ICD-10-CM

## 2019-03-10 DIAGNOSIS — M1990 Unspecified osteoarthritis, unspecified site: Principal | ICD-10-CM

## 2019-03-10 DIAGNOSIS — M150 Primary generalized (osteo)arthritis: Principal | ICD-10-CM

## 2019-03-10 DIAGNOSIS — F258 Other schizoaffective disorders: Principal | ICD-10-CM

## 2019-03-10 DIAGNOSIS — C801 Malignant (primary) neoplasm, unspecified: Principal | ICD-10-CM

## 2019-03-10 DIAGNOSIS — F323 Major depressive disorder, single episode, severe with psychotic features: Principal | ICD-10-CM

## 2019-03-10 DIAGNOSIS — N6009 Solitary cyst of unspecified breast: Principal | ICD-10-CM

## 2019-03-10 DIAGNOSIS — J438 Other emphysema: Principal | ICD-10-CM

## 2019-03-10 DIAGNOSIS — F17200 Nicotine dependence, unspecified, uncomplicated: Principal | ICD-10-CM

## 2019-03-10 DIAGNOSIS — N182 Chronic kidney disease, stage 2 (mild): Principal | ICD-10-CM

## 2019-03-10 DIAGNOSIS — F419 Anxiety disorder, unspecified: Principal | ICD-10-CM

## 2019-03-10 DIAGNOSIS — G603 Idiopathic progressive neuropathy: Principal | ICD-10-CM

## 2019-03-10 DIAGNOSIS — E119 Type 2 diabetes mellitus without complications: Principal | ICD-10-CM

## 2019-03-10 DIAGNOSIS — C675 Malignant neoplasm of bladder neck: Principal | ICD-10-CM

## 2019-03-16 MED ORDER — LOSARTAN 100 MG TABLET
ORAL_TABLET | ORAL | 3 refills | 0.00000 days | Status: CP
Start: 2019-03-16 — End: 2019-03-16

## 2019-03-16 MED ORDER — LOSARTAN 100 MG TABLET: tablet | 0 refills | 0 days | Status: AC

## 2019-03-25 ENCOUNTER — Institutional Professional Consult (permissible substitution): Admit: 2019-03-25 | Discharge: 2019-03-26 | Payer: MEDICARE

## 2019-03-25 DIAGNOSIS — C670 Malignant neoplasm of trigone of bladder: Principal | ICD-10-CM

## 2019-03-25 DIAGNOSIS — F17200 Nicotine dependence, unspecified, uncomplicated: Principal | ICD-10-CM

## 2019-03-25 DIAGNOSIS — M4802 Spinal stenosis, cervical region: Principal | ICD-10-CM

## 2019-03-25 DIAGNOSIS — F250 Schizoaffective disorder, bipolar type: Principal | ICD-10-CM

## 2019-03-25 DIAGNOSIS — J439 Emphysema, unspecified: Principal | ICD-10-CM

## 2019-03-25 DIAGNOSIS — E1149 Type 2 diabetes mellitus with other diabetic neurological complication: Principal | ICD-10-CM

## 2019-03-25 DIAGNOSIS — I10 Essential (primary) hypertension: Principal | ICD-10-CM

## 2019-04-08 MED ORDER — MELOXICAM 7.5 MG TABLET
ORAL_TABLET | Freq: Every day | ORAL | 0 refills | 0 days | Status: CP
Start: 2019-04-08 — End: 2019-04-29

## 2019-04-09 ENCOUNTER — Institutional Professional Consult (permissible substitution): Admit: 2019-04-09 | Discharge: 2019-04-10 | Payer: MEDICARE

## 2019-04-09 DIAGNOSIS — F250 Schizoaffective disorder, bipolar type: Secondary | ICD-10-CM

## 2019-04-09 DIAGNOSIS — F17200 Nicotine dependence, unspecified, uncomplicated: Secondary | ICD-10-CM

## 2019-04-09 DIAGNOSIS — G589 Mononeuropathy, unspecified: Secondary | ICD-10-CM

## 2019-04-09 DIAGNOSIS — I10 Essential (primary) hypertension: Secondary | ICD-10-CM

## 2019-04-09 DIAGNOSIS — M150 Primary generalized (osteo)arthritis: Secondary | ICD-10-CM

## 2019-04-09 DIAGNOSIS — J438 Other emphysema: Principal | ICD-10-CM

## 2019-04-09 DIAGNOSIS — M4802 Spinal stenosis, cervical region: Secondary | ICD-10-CM

## 2019-04-09 DIAGNOSIS — C670 Malignant neoplasm of trigone of bladder: Secondary | ICD-10-CM

## 2019-04-09 DIAGNOSIS — N182 Chronic kidney disease, stage 2 (mild): Secondary | ICD-10-CM

## 2019-04-09 MED ORDER — DOXYCYCLINE HYCLATE 100 MG TABLET
ORAL_TABLET | 0 refills | 0 days | Status: CP
Start: 2019-04-09 — End: ?

## 2019-04-29 MED ORDER — MELOXICAM 7.5 MG TABLET: tablet | 0 refills | 0 days | Status: AC

## 2019-04-29 MED ORDER — MELOXICAM 7.5 MG TABLET
ORAL_TABLET | ORAL | 0 refills | 0.00000 days | Status: CP
Start: 2019-04-29 — End: 2019-04-29

## 2019-06-03 ENCOUNTER — Ambulatory Visit: Admit: 2019-06-03 | Discharge: 2019-06-04 | Payer: MEDICARE

## 2019-06-03 DIAGNOSIS — G603 Idiopathic progressive neuropathy: Secondary | ICD-10-CM

## 2019-06-03 DIAGNOSIS — M48061 Spinal stenosis, lumbar region without neurogenic claudication: Secondary | ICD-10-CM

## 2019-06-03 DIAGNOSIS — G609 Hereditary and idiopathic neuropathy, unspecified: Secondary | ICD-10-CM

## 2019-06-03 DIAGNOSIS — E1149 Type 2 diabetes mellitus with other diabetic neurological complication: Secondary | ICD-10-CM

## 2019-06-03 DIAGNOSIS — I10 Essential (primary) hypertension: Secondary | ICD-10-CM

## 2019-06-03 DIAGNOSIS — M19012 Primary osteoarthritis, left shoulder: Secondary | ICD-10-CM

## 2019-06-03 DIAGNOSIS — M150 Primary generalized (osteo)arthritis: Principal | ICD-10-CM

## 2019-06-03 DIAGNOSIS — M5412 Radiculopathy, cervical region: Secondary | ICD-10-CM

## 2019-06-03 DIAGNOSIS — M4802 Spinal stenosis, cervical region: Secondary | ICD-10-CM

## 2019-06-03 DIAGNOSIS — J438 Other emphysema: Secondary | ICD-10-CM

## 2019-06-03 DIAGNOSIS — N182 Chronic kidney disease, stage 2 (mild): Secondary | ICD-10-CM

## 2019-06-03 DIAGNOSIS — C670 Malignant neoplasm of trigone of bladder: Secondary | ICD-10-CM

## 2019-06-03 DIAGNOSIS — F17200 Nicotine dependence, unspecified, uncomplicated: Secondary | ICD-10-CM

## 2019-06-03 MED ORDER — TOPIRAMATE 25 MG TABLET
ORAL_TABLET | 3 refills | 0 days | Status: CP
Start: 2019-06-03 — End: 2019-06-04

## 2019-06-04 ENCOUNTER — Institutional Professional Consult (permissible substitution): Admit: 2019-06-04 | Discharge: 2019-06-05

## 2019-06-04 DIAGNOSIS — M4802 Spinal stenosis, cervical region: Secondary | ICD-10-CM

## 2019-06-04 DIAGNOSIS — G603 Idiopathic progressive neuropathy: Secondary | ICD-10-CM

## 2019-06-04 DIAGNOSIS — M150 Primary generalized (osteo)arthritis: Principal | ICD-10-CM

## 2019-06-04 DIAGNOSIS — G609 Hereditary and idiopathic neuropathy, unspecified: Secondary | ICD-10-CM

## 2019-06-12 MED ORDER — METFORMIN 500 MG TABLET
ORAL_TABLET | 0 refills | 0 days | Status: CP
Start: 2019-06-12 — End: 2019-07-02

## 2019-06-29 MED ORDER — ALBUTEROL SULFATE HFA 90 MCG/ACTUATION AEROSOL INHALER
2 refills | 0 days | Status: CP
Start: 2019-06-29 — End: ?

## 2019-07-02 MED ORDER — METFORMIN 500 MG TABLET
ORAL_TABLET | 0 refills | 0 days | Status: CP
Start: 2019-07-02 — End: ?

## 2019-07-10 MED ORDER — FUROSEMIDE 20 MG TABLET
ORAL_TABLET | 1 refills | 0 days | Status: CP
Start: 2019-07-10 — End: ?

## 2019-07-29 ENCOUNTER — Ambulatory Visit: Admit: 2019-07-29 | Discharge: 2019-07-30 | Payer: MEDICARE

## 2019-07-29 DIAGNOSIS — M25512 Pain in left shoulder: Secondary | ICD-10-CM

## 2019-07-29 DIAGNOSIS — M25511 Pain in right shoulder: Principal | ICD-10-CM

## 2019-07-29 MED ORDER — DICLOFENAC SODIUM 50 MG TABLET,DELAYED RELEASE
ORAL_TABLET | Freq: Two times a day (BID) | ORAL | 0 refills | 30.00000 days | Status: CP
Start: 2019-07-29 — End: 2019-08-13

## 2019-07-29 MED ORDER — PREGABALIN 75 MG CAPSULE
ORAL_CAPSULE | Freq: Two times a day (BID) | ORAL | 2 refills | 30.00000 days | Status: CP
Start: 2019-07-29 — End: 2019-10-27

## 2019-08-13 ENCOUNTER — Ambulatory Visit: Admit: 2019-08-13 | Discharge: 2019-08-14 | Payer: MEDICARE

## 2019-08-13 DIAGNOSIS — I10 Essential (primary) hypertension: Secondary | ICD-10-CM

## 2019-08-13 DIAGNOSIS — G589 Mononeuropathy, unspecified: Secondary | ICD-10-CM

## 2019-08-13 DIAGNOSIS — E1149 Type 2 diabetes mellitus with other diabetic neurological complication: Principal | ICD-10-CM

## 2019-08-13 DIAGNOSIS — M150 Primary generalized (osteo)arthritis: Secondary | ICD-10-CM

## 2019-08-13 DIAGNOSIS — F17200 Nicotine dependence, unspecified, uncomplicated: Secondary | ICD-10-CM

## 2019-08-13 DIAGNOSIS — C671 Malignant neoplasm of dome of bladder: Secondary | ICD-10-CM

## 2019-08-13 MED ORDER — FENTANYL 25 MCG/HR TRANSDERMAL PATCH
MEDICATED_PATCH | TRANSDERMAL | 0 refills | 15 days | Status: CP
Start: 2019-08-13 — End: 2020-08-12

## 2019-08-18 ENCOUNTER — Institutional Professional Consult (permissible substitution): Admit: 2019-08-18 | Discharge: 2019-08-19 | Payer: MEDICARE

## 2019-08-18 DIAGNOSIS — M150 Primary generalized (osteo)arthritis: Principal | ICD-10-CM

## 2019-08-18 DIAGNOSIS — M48061 Spinal stenosis, lumbar region without neurogenic claudication: Secondary | ICD-10-CM

## 2019-08-18 DIAGNOSIS — M67912 Unspecified disorder of synovium and tendon, left shoulder: Secondary | ICD-10-CM

## 2019-09-02 ENCOUNTER — Ambulatory Visit: Admit: 2019-09-02 | Discharge: 2019-09-03 | Payer: MEDICARE

## 2019-09-02 DIAGNOSIS — M25511 Pain in right shoulder: Secondary | ICD-10-CM

## 2019-09-02 DIAGNOSIS — M25512 Pain in left shoulder: Secondary | ICD-10-CM

## 2019-09-02 DIAGNOSIS — G5602 Carpal tunnel syndrome, left upper limb: Secondary | ICD-10-CM

## 2019-09-15 DIAGNOSIS — M5410 Radiculopathy, site unspecified: Secondary | ICD-10-CM

## 2019-09-15 MED ORDER — TRAMADOL 50 MG TABLET
ORAL_TABLET | 3 refills | 0 days | Status: CP
Start: 2019-09-15 — End: ?

## 2019-09-16 ENCOUNTER — Ambulatory Visit: Admit: 2019-09-16 | Discharge: 2019-09-17 | Payer: MEDICARE

## 2019-09-16 DIAGNOSIS — K5900 Constipation, unspecified: Secondary | ICD-10-CM

## 2019-09-16 DIAGNOSIS — Z906 Acquired absence of other parts of urinary tract: Secondary | ICD-10-CM

## 2019-09-16 DIAGNOSIS — Z791 Long term (current) use of non-steroidal anti-inflammatories (NSAID): Secondary | ICD-10-CM

## 2019-09-16 DIAGNOSIS — Z9221 Personal history of antineoplastic chemotherapy: Secondary | ICD-10-CM

## 2019-09-16 DIAGNOSIS — C679 Malignant neoplasm of bladder, unspecified: Secondary | ICD-10-CM

## 2019-09-16 DIAGNOSIS — K435 Parastomal hernia without obstruction or  gangrene: Secondary | ICD-10-CM

## 2019-09-16 DIAGNOSIS — Z7984 Long term (current) use of oral hypoglycemic drugs: Secondary | ICD-10-CM

## 2019-09-16 DIAGNOSIS — K449 Diaphragmatic hernia without obstruction or gangrene: Secondary | ICD-10-CM

## 2019-09-17 DIAGNOSIS — E1149 Type 2 diabetes mellitus with other diabetic neurological complication: Secondary | ICD-10-CM

## 2019-09-17 DIAGNOSIS — F250 Schizoaffective disorder, bipolar type: Secondary | ICD-10-CM

## 2019-09-24 ENCOUNTER — Ambulatory Visit: Admit: 2019-09-24 | Discharge: 2019-09-25 | Payer: MEDICARE

## 2019-09-24 DIAGNOSIS — N182 Chronic kidney disease, stage 2 (mild): Secondary | ICD-10-CM

## 2019-09-24 DIAGNOSIS — M150 Primary generalized (osteo)arthritis: Secondary | ICD-10-CM

## 2019-09-24 DIAGNOSIS — F250 Schizoaffective disorder, bipolar type: Secondary | ICD-10-CM

## 2019-09-24 DIAGNOSIS — D696 Thrombocytopenia, unspecified: Secondary | ICD-10-CM

## 2019-09-24 DIAGNOSIS — G609 Hereditary and idiopathic neuropathy, unspecified: Secondary | ICD-10-CM

## 2019-09-24 DIAGNOSIS — J439 Emphysema, unspecified: Secondary | ICD-10-CM

## 2019-09-24 DIAGNOSIS — I10 Essential (primary) hypertension: Secondary | ICD-10-CM

## 2019-09-24 DIAGNOSIS — G5600 Carpal tunnel syndrome, unspecified upper limb: Secondary | ICD-10-CM

## 2019-09-24 DIAGNOSIS — C675 Malignant neoplasm of bladder neck: Secondary | ICD-10-CM

## 2019-09-24 DIAGNOSIS — E1149 Type 2 diabetes mellitus with other diabetic neurological complication: Secondary | ICD-10-CM

## 2019-09-24 DIAGNOSIS — F17200 Nicotine dependence, unspecified, uncomplicated: Secondary | ICD-10-CM

## 2019-09-24 DIAGNOSIS — M48061 Spinal stenosis, lumbar region without neurogenic claudication: Secondary | ICD-10-CM

## 2019-09-28 DIAGNOSIS — E1149 Type 2 diabetes mellitus with other diabetic neurological complication: Secondary | ICD-10-CM

## 2019-09-28 DIAGNOSIS — I10 Essential (primary) hypertension: Secondary | ICD-10-CM

## 2019-09-29 MED ORDER — METFORMIN 500 MG TABLET
ORAL_TABLET | 2 refills | 0 days | Status: CP
Start: 2019-09-29 — End: ?

## 2019-10-01 ENCOUNTER — Institutional Professional Consult (permissible substitution): Admit: 2019-10-01 | Discharge: 2019-10-02 | Payer: MEDICARE

## 2019-10-01 ENCOUNTER — Emergency Department: Payer: Medicare Other

## 2019-10-01 ENCOUNTER — Emergency Department
Admission: EM | Admit: 2019-10-01 | Discharge: 2019-10-01 | Disposition: A | Discharge status: Home / Self Care | Payer: Medicare Other | Attending: Emergency Medicine | Admitting: Emergency Medicine

## 2019-10-01 ENCOUNTER — Other Ambulatory Visit: Payer: Self-pay

## 2019-10-01 DIAGNOSIS — Z79899 Other long term (current) drug therapy: Secondary | ICD-10-CM | POA: Insufficient documentation

## 2019-10-01 DIAGNOSIS — Z8551 Personal history of malignant neoplasm of bladder: Secondary | ICD-10-CM | POA: Insufficient documentation

## 2019-10-01 DIAGNOSIS — I10 Essential (primary) hypertension: Secondary | ICD-10-CM | POA: Diagnosis not present

## 2019-10-01 DIAGNOSIS — E119 Type 2 diabetes mellitus without complications: Secondary | ICD-10-CM | POA: Diagnosis not present

## 2019-10-01 DIAGNOSIS — F17210 Nicotine dependence, cigarettes, uncomplicated: Secondary | ICD-10-CM | POA: Insufficient documentation

## 2019-10-01 DIAGNOSIS — H538 Other visual disturbances: Secondary | ICD-10-CM | POA: Diagnosis present

## 2019-10-01 DIAGNOSIS — Z7984 Long term (current) use of oral hypoglycemic drugs: Secondary | ICD-10-CM | POA: Insufficient documentation

## 2019-10-01 DIAGNOSIS — R42 Dizziness and giddiness: Secondary | ICD-10-CM

## 2019-10-01 LAB — TROPONIN I (HIGH SENSITIVITY)
Troponin I (High Sensitivity): 8 ng/L (ref <18)
Troponin I (High Sensitivity): 9 ng/L (ref <18)

## 2019-10-01 LAB — CBC
HCT: 41.5 % (ref 36.0–46.0)
Hemoglobin: 13.2 g/dL (ref 12.0–15.0)
MCH: 29.3 pg (ref 26.0–34.0)
MCHC: 31.8 g/dL (ref 30.0–36.0)
MCV: 92.2 fL (ref 80.0–100.0)
Platelets: 313 10*3/uL (ref 150–400)
RBC: 4.5 MIL/uL (ref 3.87–5.11)
RDW: 16.2 % — ABNORMAL HIGH (ref 11.5–15.5)
WBC: 7.1 10*3/uL (ref 4.0–10.5)
nRBC: 0 % (ref 0.0–0.2)

## 2019-10-01 LAB — COMPREHENSIVE METABOLIC PANEL
ALT: 10 U/L (ref 0–44)
AST: 18 U/L (ref 15–41)
Albumin: 3.9 g/dL (ref 3.5–5.0)
Alkaline Phosphatase: 65 U/L (ref 38–126)
Anion gap: 10 (ref 5–15)
BUN: 23 mg/dL (ref 8–23)
CO2: 25 mmol/L (ref 22–32)
Calcium: 9.6 mg/dL (ref 8.9–10.3)
Chloride: 101 mmol/L (ref 98–111)
Creatinine, Ser: 0.92 mg/dL (ref 0.44–1.00)
GFR calc Af Amer: >60 mL/min (ref >60)
GFR calc non Af Amer: >60 mL/min (ref >60)
Glucose, Bld: 118 mg/dL — ABNORMAL HIGH (ref 70–99)
Potassium: 4.3 mmol/L (ref 3.5–5.1)
Sodium: 136 mmol/L (ref 135–145)
Total Bilirubin: 0.8 mg/dL (ref 0.3–1.2)
Total Protein: 8 g/dL (ref 6.5–8.1)

## 2019-10-01 MED ORDER — IBUPROFEN 400 MG PO TABS
400.0000 mg | ORAL_TABLET | Freq: Once | ORAL | Status: AC
  Administered 2019-10-01: 400 mg via ORAL
  Filled 2019-10-01: qty 1

## 2019-10-01 NOTE — ED Provider Notes (Signed)
[facility] Emergency Department Provider Note  ____________________________________________  Time seen: Approximately 6:29 AM  I have reviewed the triage vital signs and the nursing notes.   HISTORY  Chief Complaint Hypertension   HPI Brittany Hernandez is a 78 y.o. female history of bladder cancer status post cystectomy, diabetes, hypertension, COPD who presents from home for blurry vision, dizziness and elevated blood pressure.  Patient reports that she woke up this morning feeling lightheaded.  She noted that her vision was blurry bilaterally.  She checked her blood pressure which was elevated with the systolics in the [id number].  She took her morning losartan and 911 was called.  In route to the hospital she started feeling better and at this time she is asymptomatic.  She denies headache or head trauma, she is not on blood thinners, she denies any history of stroke, she denied slurred speech, vertigo, unilateral weakness or numbness, facial droop.  She also denies any chest pain or shortness of breath, no abdominal pain, no nausea or vomiting.  She endorses compliance with her medications.   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) 05/08/2015   HTN (hypertension) 05/05/2015   DM (diabetes mellitus), type 2 with neurological complications ([place]) [id number]    Past Surgical History:  Procedure Laterality Date   ABDOMINAL HYSTERECTOMY     CHOLECYSTECTOMY     CYSTECTOMY W/ CONTINENT DIVERSION Right    URETEROSTOMY Right     Prior to Admission medications   Medication Sig Start Date End Date Taking? Authorizing Provider    acetaminophen (TYLENOL) 500 MG tablet Take 1,000 mg by mouth every 8 (eight) hours as needed for pain. 11/13/17   [provider]  albuterol (PROVENTIL HFA;VENTOLIN HFA) 108 (90 Base) MCG/ACT inhaler Inhale 2 puffs into the lungs every 4 (four) hours as needed for wheezing. 08/11/18   [provider]  fluPHENAZine decanoate (PROLIXIN) 25 MG/ML injection  04/13/15   [provider]  furosemide (LASIX) 20 MG tablet Take 20 mg by mouth daily.    [provider]  gabapentin (NEURONTIN) 100 MG capsule 1 capsule today and then beginning tomorrow take 1 capsule twice a day. Patient taking differently: Take 100 mg by mouth 2 (two) times daily.  08/04/18   Johnn Hai, PA-C  losartan (COZAAR) 100 MG tablet Take 100 mg by mouth daily.    [provider]  meclizine (ANTIVERT) 12.5 MG tablet Take 1 tablet (12.5 mg total) by mouth 2 (two) times daily as needed for dizziness. 05/08/15   Aldean Jewett, MD  metFORMIN (GLUCOPHAGE) 500 MG tablet Take 500 mg by mouth daily. 06/13/18   [provider]  topiramate (TOPAMAX) 25 MG tablet Take 25 mg by mouth Nightly. 11/25/18   [provider]    Allergies Lisinopril  Family History  Problem Relation Age of Onset   Diabetes Mellitus II Other    CAD Other     Social History Social History   Tobacco Use   Smoking status: Current Every Day Smoker    Packs/day: 1.00    Years: 50.00    Pack years: 50.00    Types: Cigarettes  Substance Use Topics   Alcohol use: No   Drug use: No    Review of Systems  Constitutional: Negative for fever. + Lightheadedness Eyes: + blurry vision ENT: Negative for sore throat. Neck: No neck pain  Cardiovascular: Negative for chest pain. Respiratory: Negative for shortness of breath. Gastrointestinal: Negative for abdominal pain, vomiting or diarrhea. Genitourinary: Negative for dysuria. Musculoskeletal: Negative for back pain. Skin: Negative for  rash. Neurological: Negative for headaches, weakness or numbness. Psych: No SI or HI  ____________________________________________   PHYSICAL EXAM:  VITAL SIGNS: ED Triage Vitals  Enc Vitals Group     BP 10/01/19 0530 (!) 160/107     Pulse Rate 10/01/19 0530 71     Resp 10/01/19 0530 20     Temp 10/01/19 0524 98.4 F (36.9 C)     Temp src --      SpO2 10/01/19 0530 90 %     Weight 10/01/19 0525 135 lb (61.2 kg)     Height 10/01/19 0525 5\' 7"  (1.702 m)     Head Circumference --      Peak Flow --      Pain Score 10/01/19 0525 0     Pain Loc --      Pain Edu? --      Excl. in [place]? --     Constitutional: Alert and oriented. Well appearing and in no apparent distress. HEENT:      Head: Normocephalic and atraumatic.         Eyes: Conjunctivae are normal. Sclera is non-icteric.       Mouth/Throat: Mucous membranes are moist.       Neck: Supple with no signs of meningismus. Cardiovascular: Regular rate and rhythm. No murmurs, gallops, or rubs. 2+ symmetrical distal pulses are present in all extremities. No JVD. Respiratory: Normal respiratory effort. Lungs are clear to auscultation bilaterally. No wheezes, crackles, or rhonchi.  Gastrointestinal: Soft, non tender, and non distended with positive bowel sounds. No rebound or guarding. Musculoskeletal: Nontender with normal range of motion in all extremities. No edema, cyanosis, or erythema of extremities. Neurologic: Normal speech and language. Face is symmetric.  Pupils equal round and reactive, intact extraocular movements, no nystagmus, intact strength and sensation x4, no pronator drift, no dysmetria. Skin: Skin is warm, dry and intact. No rash noted. Psychiatric: Mood and affect are normal. Speech and behavior are normal.  ____________________________________________   LABS (all labs ordered are listed, but only abnormal results are displayed)  Labs Reviewed  CBC - Abnormal; Notable for the following components:       Result Value   RDW 16.2 (*)    All other components within normal limits  COMPREHENSIVE METABOLIC PANEL - Abnormal; Notable for the following components:   Glucose, Bld 118 (*)    All other components within normal limits  TROPONIN I (HIGH SENSITIVITY)   ____________________________________________  EKG  ED ECG REPORT I, Rudene Re, the attending physician, personally viewed and interpreted this ECG.  Normal sinus rhythm, rate of 68, normal intervals, normal axis, no ST elevations or depressions. ____________________________________________  RADIOLOGY  I have personally reviewed the images performed during this visit and I agree with the Radiologist's read.   Interpretation by Radiologist:  Ct Head Wo Contrast  Result Date: 10/01/2019 CLINICAL DATA:  Double vision EXAM: CT HEAD WITHOUT CONTRAST TECHNIQUE: Contiguous axial images were obtained from the base of the skull through the vertex without intravenous contrast. COMPARISON:  01/26/2019 FINDINGS: Brain: No evidence of acute infarction,  hemorrhage, hydrocephalus, extra-axial collection or mass lesion/mass effect. Vascular: Atherosclerotic calcification Skull: Normal. Negative for fracture or focal lesion. Sinuses/Orbits: Negative IMPRESSION: Stable and unremarkable exam for age. Electronically Signed   By: Monte Fantasia M.D.   On: 10/01/2019 06:17     ____________________________________________   PROCEDURES  Procedure(s) performed: None Procedures Critical Care performed:  None ____________________________________________   INITIAL IMPRESSION / ASSESSMENT AND PLAN / ED COURSE  78 y.o. female history of bladder cancer status post cystectomy, diabetes, hypertension, COPD who presents from home for blurry vision, dizziness and elevated blood pressure.  Patient asymptomatic upon arrival to the emergency room.  EKG with no evidence of ischemia.  Head CT with no evidence of head bleed.  Exam with no evidence of  stroke.  Labs with no evidence of endorgan damage, anemia, dehydration, electrolyte abnormalities.  Blood pressure is trending down after patient took losartan at home. Plan to repeat troponin at 7:30AM and continue to monitor BP. Recommended to patient and her daughter to keep a BP diary at home for the next 48hrs and close follow up with PCP for further management of BP. Care transferred to Dr. Corky Downs at [facility]       As part of my medical decision making, I reviewed the following data within the [place] History obtained from family, Nursing notes reviewed and incorporated, Labs reviewed , EKG interpreted , Old EKG reviewed, Old chart reviewed, Radiograph reviewed , Notes from prior ED visits and [place] Controlled Substance Database   Patient was evaluated in Emergency Department today for the symptoms described in the history of present illness. Patient was evaluated in the context of the global COVID-19 pandemic, which necessitated consideration that the patient might be at risk for infection with the SARS-CoV-2 virus that causes COVID-19. Institutional protocols and algorithms that pertain to the evaluation of patients at risk for COVID-19 are in a state of rapid change based on information released by regulatory bodies including the CDC and federal and state organizations. These policies and algorithms were followed during the patient's care in the ED.   ____________________________________________   FINAL CLINICAL IMPRESSION(S) / ED DIAGNOSES   Final diagnoses:  Essential hypertension      NEW MEDICATIONS STARTED DURING THIS VISIT:  ED Discharge Orders    None       Note:  This document was prepared using Dragon voice recognition software and may include unintentional dictation errors.    Rudene Re, MD 10/01/19 [phone]

## 2019-10-01 NOTE — ED Notes (Signed)
Signature pad in room not working so unable to obtain d/c signature. Patient and daughter verbalized understanding of discharge instructions and follow-up care. Patient to car in wheel chair. Able to walk at baseline per self and daughter.

## 2019-10-01 NOTE — ED Provider Notes (Signed)
Asked to follow-up on second troponin, patient remains asymptomatic and feels well with improving blood pressure.  No significant change in troponin, appropriate for discharge at this time with outpatient follow-up   Lavonia Drafts, MD 10/01/19 [phone]

## 2019-10-01 NOTE — ED Triage Notes (Addendum)
Pt arrived via [facility] from home with HTN. Pt has a hx of the same. Pt woke up to dizziness and blurred vision. Pt a&ox4.

## 2019-10-01 NOTE — Discharge Instructions (Addendum)
Make sure to maintain a blood pressure diary by checking your blood pressure at least 3 times a day for the next 48 hours so your primary care doctor may evaluate if you need any changes in your medication.  Return to the emergency room if your blood pressure is greater than 200 or if you have a headache, dizziness, chest pain, shortness of breath.

## 2019-10-02 ENCOUNTER — Institutional Professional Consult (permissible substitution): Admit: 2019-10-02 | Discharge: 2019-10-03 | Payer: MEDICARE

## 2019-10-02 DIAGNOSIS — I10 Essential (primary) hypertension: Secondary | ICD-10-CM

## 2019-10-02 MED ORDER — CHLORTHALIDONE 25 MG TABLET
ORAL_TABLET | Freq: Every morning | ORAL | 11 refills | 30 days | Status: CP
Start: 2019-10-02 — End: 2020-10-01

## 2019-10-06 ENCOUNTER — Ambulatory Visit
Admit: 2019-10-06 | Discharge: 2019-10-07 | Payer: MEDICARE | Attending: Adolescent Medicine | Primary: Adolescent Medicine

## 2019-10-06 DIAGNOSIS — J439 Emphysema, unspecified: Secondary | ICD-10-CM

## 2019-10-06 DIAGNOSIS — R42 Dizziness and giddiness: Secondary | ICD-10-CM

## 2019-10-06 DIAGNOSIS — F17200 Nicotine dependence, unspecified, uncomplicated: Secondary | ICD-10-CM

## 2019-10-06 DIAGNOSIS — H9201 Otalgia, right ear: Secondary | ICD-10-CM

## 2019-10-06 DIAGNOSIS — G4700 Insomnia, unspecified: Secondary | ICD-10-CM

## 2019-10-08 ENCOUNTER — Institutional Professional Consult (permissible substitution): Admit: 2019-10-08 | Discharge: 2019-10-09 | Payer: MEDICARE

## 2019-10-08 DIAGNOSIS — M48061 Spinal stenosis, lumbar region without neurogenic claudication: Secondary | ICD-10-CM

## 2019-10-08 DIAGNOSIS — R42 Dizziness and giddiness: Secondary | ICD-10-CM

## 2019-10-08 DIAGNOSIS — N182 Chronic kidney disease, stage 2 (mild): Secondary | ICD-10-CM

## 2019-10-08 DIAGNOSIS — I10 Essential (primary) hypertension: Secondary | ICD-10-CM

## 2019-10-08 DIAGNOSIS — C670 Malignant neoplasm of trigone of bladder: Secondary | ICD-10-CM

## 2019-10-08 DIAGNOSIS — G609 Hereditary and idiopathic neuropathy, unspecified: Secondary | ICD-10-CM

## 2019-10-08 DIAGNOSIS — F17200 Nicotine dependence, unspecified, uncomplicated: Secondary | ICD-10-CM

## 2019-10-08 DIAGNOSIS — M153 Secondary multiple arthritis: Secondary | ICD-10-CM

## 2019-10-08 DIAGNOSIS — E1149 Type 2 diabetes mellitus with other diabetic neurological complication: Secondary | ICD-10-CM

## 2019-10-09 DIAGNOSIS — I10 Essential (primary) hypertension: Secondary | ICD-10-CM

## 2019-10-13 DIAGNOSIS — N182 Chronic kidney disease, stage 2 (mild): Principal | ICD-10-CM

## 2019-10-13 DIAGNOSIS — I10 Essential (primary) hypertension: Principal | ICD-10-CM

## 2019-10-16 DIAGNOSIS — I10 Essential (primary) hypertension: Principal | ICD-10-CM

## 2019-10-16 DIAGNOSIS — N182 Chronic kidney disease, stage 2 (mild): Principal | ICD-10-CM

## 2019-11-12 ENCOUNTER — Ambulatory Visit: Admit: 2019-11-12 | Discharge: 2019-11-13 | Payer: MEDICARE

## 2019-11-12 DIAGNOSIS — E1149 Type 2 diabetes mellitus with other diabetic neurological complication: Principal | ICD-10-CM

## 2019-11-12 DIAGNOSIS — R42 Dizziness and giddiness: Principal | ICD-10-CM

## 2019-11-12 DIAGNOSIS — D696 Thrombocytopenia, unspecified: Principal | ICD-10-CM

## 2019-11-12 DIAGNOSIS — C670 Malignant neoplasm of trigone of bladder: Principal | ICD-10-CM

## 2019-11-12 DIAGNOSIS — G609 Hereditary and idiopathic neuropathy, unspecified: Principal | ICD-10-CM

## 2019-11-12 DIAGNOSIS — F17200 Nicotine dependence, unspecified, uncomplicated: Principal | ICD-10-CM

## 2019-11-12 DIAGNOSIS — N182 Chronic kidney disease, stage 2 (mild): Principal | ICD-10-CM

## 2019-11-12 DIAGNOSIS — I10 Essential (primary) hypertension: Principal | ICD-10-CM

## 2019-11-13 ENCOUNTER — Institutional Professional Consult (permissible substitution): Admit: 2019-11-13 | Discharge: 2019-11-14 | Payer: MEDICARE | Attending: Community Health | Primary: Community Health

## 2019-12-03 ENCOUNTER — Institutional Professional Consult (permissible substitution): Admit: 2019-12-03 | Discharge: 2019-12-04 | Payer: MEDICARE

## 2019-12-03 MED ORDER — TRIAMTERENE 37.5 MG-HYDROCHLOROTHIAZIDE 25 MG CAPSULE
ORAL_CAPSULE | Freq: Every day | ORAL | 11 refills | 30 days | Status: CP
Start: 2019-12-03 — End: 2020-12-02

## 2019-12-15 ENCOUNTER — Ambulatory Visit: Admit: 2019-12-15 | Discharge: 2019-12-16 | Payer: MEDICARE

## 2019-12-15 DIAGNOSIS — G629 Polyneuropathy, unspecified: Principal | ICD-10-CM

## 2019-12-15 DIAGNOSIS — F17200 Nicotine dependence, unspecified, uncomplicated: Principal | ICD-10-CM

## 2019-12-15 DIAGNOSIS — E1149 Type 2 diabetes mellitus with other diabetic neurological complication: Principal | ICD-10-CM

## 2019-12-15 DIAGNOSIS — I10 Essential (primary) hypertension: Principal | ICD-10-CM

## 2019-12-15 DIAGNOSIS — M1610 Unilateral primary osteoarthritis, unspecified hip: Principal | ICD-10-CM

## 2019-12-15 DIAGNOSIS — N182 Chronic kidney disease, stage 2 (mild): Principal | ICD-10-CM

## 2019-12-15 DIAGNOSIS — M48061 Spinal stenosis, lumbar region without neurogenic claudication: Principal | ICD-10-CM

## 2019-12-15 DIAGNOSIS — D696 Thrombocytopenia, unspecified: Principal | ICD-10-CM

## 2019-12-15 MED ORDER — TRAMADOL 50 MG TABLET
ORAL_TABLET | 3 refills | 0 days | Status: CP
Start: 2019-12-15 — End: ?

## 2020-01-28 ENCOUNTER — Ambulatory Visit: Admit: 2020-01-28 | Discharge: 2020-01-29 | Payer: MEDICARE

## 2020-01-28 DIAGNOSIS — J438 Other emphysema: Principal | ICD-10-CM

## 2020-01-28 DIAGNOSIS — F17200 Nicotine dependence, unspecified, uncomplicated: Principal | ICD-10-CM

## 2020-01-28 DIAGNOSIS — M158 Other polyosteoarthritis: Principal | ICD-10-CM

## 2020-01-28 DIAGNOSIS — I10 Essential (primary) hypertension: Principal | ICD-10-CM

## 2020-01-28 DIAGNOSIS — M1710 Unilateral primary osteoarthritis, unspecified knee: Principal | ICD-10-CM

## 2020-01-28 DIAGNOSIS — M24112 Other articular cartilage disorders, left shoulder: Principal | ICD-10-CM

## 2020-01-28 DIAGNOSIS — F251 Schizoaffective disorder, depressive type: Principal | ICD-10-CM

## 2020-01-28 DIAGNOSIS — G588 Other specified mononeuropathies: Principal | ICD-10-CM

## 2020-01-28 MED ORDER — VARENICLINE 0.5 MG TABLET
ORAL_TABLET | 5 refills | 0 days | Status: CP
Start: 2020-01-28 — End: ?

## 2020-02-03 ENCOUNTER — Ambulatory Visit: Admit: 2020-02-03 | Discharge: 2020-02-04 | Payer: MEDICARE

## 2020-02-03 DIAGNOSIS — Z8551 Personal history of malignant neoplasm of bladder: Principal | ICD-10-CM

## 2020-02-03 DIAGNOSIS — C670 Malignant neoplasm of trigone of bladder: Principal | ICD-10-CM

## 2020-02-05 ENCOUNTER — Institutional Professional Consult (permissible substitution): Admit: 2020-02-05 | Discharge: 2020-02-06 | Payer: MEDICARE

## 2020-02-05 DIAGNOSIS — R300 Dysuria: Principal | ICD-10-CM

## 2020-02-05 MED ORDER — NITROFURANTOIN MONOHYDRATE/MACROCRYSTALS 100 MG CAPSULE
ORAL_CAPSULE | Freq: Two times a day (BID) | ORAL | 0 refills | 7 days | Status: CP
Start: 2020-02-05 — End: 2020-02-12

## 2020-02-16 ENCOUNTER — Ambulatory Visit: Admit: 2020-02-16 | Discharge: 2020-02-17 | Payer: MEDICARE

## 2020-02-16 MED ORDER — ACETAMINOPHEN 500 MG TABLET
ORAL_TABLET | Freq: Four times a day (QID) | ORAL | 2 refills | 15 days | Status: CP | PRN
Start: 2020-02-16 — End: 2020-02-26

## 2020-02-16 MED ORDER — IBUPROFEN 600 MG TABLET
ORAL_TABLET | Freq: Three times a day (TID) | ORAL | 0 refills | 10.00000 days | Status: CP | PRN
Start: 2020-02-16 — End: 2020-02-16

## 2020-02-22 MED ORDER — LOSARTAN 100 MG TABLET
ORAL_TABLET | 3 refills | 0 days | Status: CP
Start: 2020-02-22 — End: ?

## 2020-03-03 ENCOUNTER — Ambulatory Visit: Admit: 2020-03-03 | Discharge: 2020-03-04 | Payer: MEDICARE

## 2020-03-09 ENCOUNTER — Ambulatory Visit: Admit: 2020-03-09 | Discharge: 2020-03-09 | Payer: MEDICARE

## 2020-03-09 DIAGNOSIS — M170 Bilateral primary osteoarthritis of knee: Principal | ICD-10-CM

## 2020-03-09 MED ORDER — POLYETHYLENE GLYCOL 3350 17 GRAM ORAL POWDER PACKET
PACK | Freq: Every day | ORAL | 11 refills | 30.00000 days | Status: CP
Start: 2020-03-09 — End: ?

## 2020-03-18 DIAGNOSIS — N182 Chronic kidney disease, stage 2 (mild): Principal | ICD-10-CM

## 2020-03-18 DIAGNOSIS — J430 Unilateral pulmonary emphysema [MacLeod's syndrome]: Principal | ICD-10-CM

## 2020-03-18 DIAGNOSIS — E1149 Type 2 diabetes mellitus with other diabetic neurological complication: Principal | ICD-10-CM

## 2020-04-19 ENCOUNTER — Ambulatory Visit: Admit: 2020-04-19 | Discharge: 2020-04-20 | Payer: MEDICARE

## 2020-04-19 MED ORDER — GABAPENTIN 100 MG CAPSULE
ORAL_CAPSULE | Freq: Three times a day (TID) | ORAL | 3 refills | 90 days | Status: CP
Start: 2020-04-19 — End: 2021-04-19

## 2020-04-19 MED ORDER — CELECOXIB 100 MG CAPSULE
ORAL_CAPSULE | Freq: Every day | ORAL | 2 refills | 30.00000 days | Status: CP
Start: 2020-04-19 — End: 2021-04-19

## 2020-04-20 ENCOUNTER — Ambulatory Visit: Admit: 2020-04-20 | Discharge: 2020-04-21 | Payer: MEDICARE | Attending: Optometrist | Primary: Optometrist

## 2020-04-20 DIAGNOSIS — E119 Type 2 diabetes mellitus without complications: Principal | ICD-10-CM

## 2020-04-20 DIAGNOSIS — H547 Unspecified visual loss: Principal | ICD-10-CM

## 2020-04-20 DIAGNOSIS — H52223 Regular astigmatism, bilateral: Principal | ICD-10-CM

## 2020-04-20 DIAGNOSIS — H53413 Scotoma involving central area, bilateral: Principal | ICD-10-CM

## 2020-04-20 DIAGNOSIS — H524 Presbyopia: Principal | ICD-10-CM

## 2020-04-20 DIAGNOSIS — H25813 Combined forms of age-related cataract, bilateral: Principal | ICD-10-CM

## 2020-05-04 ENCOUNTER — Ambulatory Visit: Admit: 2020-05-04 | Discharge: 2020-05-05 | Payer: MEDICARE

## 2020-05-04 MED ORDER — LOSARTAN 100 MG TABLET
ORAL_TABLET | Freq: Every day | ORAL | 3 refills | 90 days | Status: CP
Start: 2020-05-04 — End: ?

## 2020-05-13 ENCOUNTER — Ambulatory Visit: Admit: 2020-05-13 | Discharge: 2020-05-14 | Payer: MEDICARE

## 2020-05-19 ENCOUNTER — Institutional Professional Consult (permissible substitution): Admit: 2020-05-19 | Discharge: 2020-05-20 | Payer: MEDICARE | Attending: Audiologist | Primary: Audiologist

## 2020-05-26 ENCOUNTER — Ambulatory Visit: Admit: 2020-05-26 | Discharge: 2020-05-27 | Attending: Audiologist | Primary: Audiologist

## 2020-05-31 ENCOUNTER — Ambulatory Visit: Admit: 2020-05-31 | Discharge: 2020-06-01 | Payer: MEDICARE

## 2020-06-03 DIAGNOSIS — N3000 Acute cystitis without hematuria: Principal | ICD-10-CM

## 2020-06-03 MED ORDER — NITROFURANTOIN MONOHYDRATE/MACROCRYSTALS 100 MG CAPSULE
ORAL_CAPSULE | Freq: Two times a day (BID) | ORAL | 0 refills | 7.00000 days | Status: CP
Start: 2020-06-03 — End: 2020-06-10

## 2020-06-08 ENCOUNTER — Ambulatory Visit: Admit: 2020-06-08 | Discharge: 2020-06-09 | Payer: MEDICARE

## 2020-06-08 DIAGNOSIS — M25512 Pain in left shoulder: Principal | ICD-10-CM

## 2020-06-08 DIAGNOSIS — C670 Malignant neoplasm of trigone of bladder: Principal | ICD-10-CM

## 2020-06-08 DIAGNOSIS — M158 Other polyosteoarthritis: Principal | ICD-10-CM

## 2020-06-08 DIAGNOSIS — M1610 Unilateral primary osteoarthritis, unspecified hip: Principal | ICD-10-CM

## 2020-06-08 DIAGNOSIS — G8929 Other chronic pain: Secondary | ICD-10-CM

## 2020-06-08 DIAGNOSIS — F250 Schizoaffective disorder, bipolar type: Principal | ICD-10-CM

## 2020-06-08 DIAGNOSIS — N182 Chronic kidney disease, stage 2 (mild): Principal | ICD-10-CM

## 2020-06-08 DIAGNOSIS — G44209 Tension-type headache, unspecified, not intractable: Principal | ICD-10-CM

## 2020-06-08 DIAGNOSIS — G609 Hereditary and idiopathic neuropathy, unspecified: Principal | ICD-10-CM

## 2020-06-08 DIAGNOSIS — M1710 Unilateral primary osteoarthritis, unspecified knee: Principal | ICD-10-CM

## 2020-06-08 DIAGNOSIS — M4802 Spinal stenosis, cervical region: Principal | ICD-10-CM

## 2020-06-08 DIAGNOSIS — G603 Idiopathic progressive neuropathy: Principal | ICD-10-CM

## 2020-06-08 DIAGNOSIS — I10 Essential (primary) hypertension: Principal | ICD-10-CM

## 2020-06-08 DIAGNOSIS — R531 Weakness: Principal | ICD-10-CM

## 2020-06-08 DIAGNOSIS — E1149 Type 2 diabetes mellitus with other diabetic neurological complication: Principal | ICD-10-CM

## 2020-06-08 DIAGNOSIS — R519 Increased severity of headaches: Principal | ICD-10-CM

## 2020-06-08 DIAGNOSIS — J438 Other emphysema: Principal | ICD-10-CM

## 2020-06-08 DIAGNOSIS — R7989 Other specified abnormal findings of blood chemistry: Principal | ICD-10-CM

## 2020-06-08 DIAGNOSIS — F17200 Nicotine dependence, unspecified, uncomplicated: Principal | ICD-10-CM

## 2020-06-08 DIAGNOSIS — M170 Bilateral primary osteoarthritis of knee: Principal | ICD-10-CM

## 2020-06-16 ENCOUNTER — Other Ambulatory Visit: Payer: Self-pay

## 2020-06-16 ENCOUNTER — Emergency Department: Payer: Medicare Other

## 2020-06-16 DIAGNOSIS — R0981 Nasal congestion: Secondary | ICD-10-CM | POA: Insufficient documentation

## 2020-06-16 DIAGNOSIS — R05 Cough: Secondary | ICD-10-CM | POA: Diagnosis not present

## 2020-06-16 DIAGNOSIS — R519 Headache, unspecified: Secondary | ICD-10-CM | POA: Diagnosis not present

## 2020-06-16 DIAGNOSIS — Z5321 Procedure and treatment not carried out due to patient leaving prior to being seen by health care provider: Secondary | ICD-10-CM | POA: Insufficient documentation

## 2020-06-16 LAB — BASIC METABOLIC PANEL
Anion gap: 9 (ref 5–15)
BUN: 23 mg/dL (ref 8–23)
CO2: 28 mmol/L (ref 22–32)
Calcium: 9.6 mg/dL (ref 8.9–10.3)
Chloride: 89 mmol/L — ABNORMAL LOW (ref 98–111)
Creatinine, Ser: 1.12 mg/dL — ABNORMAL HIGH (ref 0.44–1.00)
GFR calc Af Amer: 55 mL/min — ABNORMAL LOW (ref >60)
GFR calc non Af Amer: 47 mL/min — ABNORMAL LOW (ref >60)
Glucose, Bld: 113 mg/dL — ABNORMAL HIGH (ref 70–99)
Potassium: 4.2 mmol/L (ref 3.5–5.1)
Sodium: 126 mmol/L — ABNORMAL LOW (ref 135–145)

## 2020-06-16 LAB — CBC
HCT: 35.9 % — ABNORMAL LOW (ref 36.0–46.0)
Hemoglobin: 12.4 g/dL (ref 12.0–15.0)
MCH: 30.9 pg (ref 26.0–34.0)
MCHC: 34.5 g/dL (ref 30.0–36.0)
MCV: 89.5 fL (ref 80.0–100.0)
Platelets: 309 10*3/uL (ref 150–400)
RBC: 4.01 MIL/uL (ref 3.87–5.11)
RDW: 13.9 % (ref 11.5–15.5)
WBC: 7.9 10*3/uL (ref 4.0–10.5)
nRBC: 0 % (ref 0.0–0.2)

## 2020-06-16 LAB — TROPONIN I (HIGH SENSITIVITY)
Troponin I (High Sensitivity): 10 ng/L (ref <18)
Troponin I (High Sensitivity): 10 ng/L (ref <18)

## 2020-06-16 NOTE — ED Triage Notes (Signed)
Pt comes EMS from home with a headache for a month and left shoulder pain. No recent falls. Pt also has small cough sounds congested.

## 2020-06-17 ENCOUNTER — Emergency Department
Admission: EM | Admit: 2020-06-17 | Discharge: 2020-06-17 | Disposition: A | Discharge status: Home / Self Care | Payer: Medicare Other | Attending: Emergency Medicine | Admitting: Emergency Medicine

## 2020-06-17 NOTE — ED Notes (Signed)
No answer when called several times from lobby 

## 2020-06-22 ENCOUNTER — Ambulatory Visit: Admit: 2020-06-22 | Discharge: 2020-06-29 | Disposition: A | Discharge status: Home Health | Payer: MEDICARE

## 2020-06-22 ENCOUNTER — Encounter: Admit: 2020-06-22 | Discharge: 2020-06-29 | Disposition: A | Discharge status: Home Health | Payer: MEDICARE

## 2020-06-22 ENCOUNTER — Telehealth: Payer: Self-pay | Admitting: Emergency Medicine

## 2020-06-22 NOTE — Telephone Encounter (Signed)
Called patient due to lwot to inquire about condition and follow up plans. No answer and no voicemail on mobile number.  Called home number and the VM box is full, so I was unable to leave a message.

## 2020-06-29 MED ORDER — NICOTINE (POLACRILEX) 2 MG GUM
BUCCAL | 0 refills | 5.00000 days | Status: CP | PRN
Start: 2020-06-29 — End: 2020-07-29

## 2020-06-29 MED ORDER — FERROUS SULFATE 325 MG (65 MG IRON) TABLET
ORAL_TABLET | ORAL | 11 refills | 30.00000 days | Status: CP
Start: 2020-06-29 — End: 2021-06-29

## 2020-06-29 MED ORDER — DICLOFENAC 1 % TOPICAL GEL
Freq: Four times a day (QID) | TOPICAL | 0 refills | 13.00000 days | PRN
Start: 2020-06-29 — End: 2021-06-29

## 2020-06-29 MED ORDER — NICOTINE (POLACRILEX) 2 MG BUCCAL LOZENGE
BUCCAL | 0 refills | 5 days | Status: CP | PRN
Start: 2020-06-29 — End: 2020-07-29

## 2020-06-30 MED ORDER — LIDOCAINE 5 % TOPICAL PATCH
MEDICATED_PATCH | Freq: Every day | TRANSDERMAL | 0 refills | 30 days
Start: 2020-06-30 — End: 2020-07-30

## 2020-07-01 ENCOUNTER — Ambulatory Visit
Admit: 2020-07-01 | Discharge: 2020-07-01 | Payer: MEDICARE | Attending: Student in an Organized Health Care Education/Training Program | Primary: Student in an Organized Health Care Education/Training Program

## 2020-07-01 ENCOUNTER — Ambulatory Visit: Admit: 2020-07-01 | Discharge: 2020-07-01 | Payer: MEDICARE

## 2020-07-01 DIAGNOSIS — F250 Schizoaffective disorder, bipolar type: Principal | ICD-10-CM

## 2020-07-01 DIAGNOSIS — E1149 Type 2 diabetes mellitus with other diabetic neurological complication: Principal | ICD-10-CM

## 2020-07-01 DIAGNOSIS — R238 Other skin changes: Principal | ICD-10-CM

## 2020-07-01 DIAGNOSIS — E871 Hypo-osmolality and hyponatremia: Principal | ICD-10-CM

## 2020-07-01 MED ORDER — NICOTINE 14 MG/24 HR DAILY TRANSDERMAL PATCH
MEDICATED_PATCH | TRANSDERMAL | 11 refills | 28.00000 days | Status: CP
Start: 2020-07-01 — End: ?

## 2020-07-01 MED ORDER — NICOTINE (POLACRILEX) 4 MG GUM
BUCCAL | 2 refills | 5.00000 days | Status: CP | PRN
Start: 2020-07-01 — End: 2021-07-01

## 2020-07-01 MED ORDER — SPIRONOLACTONE 25 MG TABLET
ORAL_TABLET | Freq: Every day | ORAL | 3 refills | 90.00000 days | Status: CP
Start: 2020-07-01 — End: 2021-07-01

## 2020-07-08 ENCOUNTER — Ambulatory Visit: Admit: 2020-07-08 | Discharge: 2020-07-09 | Payer: MEDICARE

## 2020-07-12 ENCOUNTER — Ambulatory Visit: Admit: 2020-07-12 | Discharge: 2020-07-13 | Payer: MEDICARE

## 2020-07-12 DIAGNOSIS — R05 Cough: Principal | ICD-10-CM

## 2020-07-12 DIAGNOSIS — J189 Pneumonia, unspecified organism: Principal | ICD-10-CM

## 2020-07-12 MED ORDER — AMLODIPINE 2.5 MG TABLET
ORAL_TABLET | Freq: Every day | ORAL | 11 refills | 30 days | Status: CP
Start: 2020-07-12 — End: 2021-07-12

## 2020-07-12 MED ORDER — ALBUTEROL SULFATE HFA 90 MCG/ACTUATION AEROSOL INHALER
2 refills | 0 days | Status: CP
Start: 2020-07-12 — End: ?

## 2020-07-12 MED ORDER — VARENICLINE 0.5 MG TABLET
ORAL_TABLET | 5 refills | 0 days | Status: CP
Start: 2020-07-12 — End: ?

## 2020-07-20 ENCOUNTER — Other Ambulatory Visit: Payer: Self-pay

## 2020-07-20 ENCOUNTER — Emergency Department: Payer: Medicare Other

## 2020-07-20 ENCOUNTER — Emergency Department
Admission: EM | Admit: 2020-07-20 | Discharge: 2020-07-20 | Disposition: A | Discharge status: Home / Self Care | Payer: Medicare Other | Attending: Emergency Medicine | Admitting: Emergency Medicine

## 2020-07-20 DIAGNOSIS — R519 Headache, unspecified: Secondary | ICD-10-CM | POA: Diagnosis not present

## 2020-07-20 DIAGNOSIS — Z7984 Long term (current) use of oral hypoglycemic drugs: Secondary | ICD-10-CM | POA: Diagnosis not present

## 2020-07-20 DIAGNOSIS — I10 Essential (primary) hypertension: Secondary | ICD-10-CM | POA: Insufficient documentation

## 2020-07-20 DIAGNOSIS — M542 Cervicalgia: Secondary | ICD-10-CM | POA: Insufficient documentation

## 2020-07-20 DIAGNOSIS — M7918 Myalgia, other site: Secondary | ICD-10-CM

## 2020-07-20 DIAGNOSIS — Z79899 Other long term (current) drug therapy: Secondary | ICD-10-CM | POA: Insufficient documentation

## 2020-07-20 DIAGNOSIS — Z8551 Personal history of malignant neoplasm of bladder: Secondary | ICD-10-CM | POA: Insufficient documentation

## 2020-07-20 DIAGNOSIS — Z7951 Long term (current) use of inhaled steroids: Secondary | ICD-10-CM | POA: Insufficient documentation

## 2020-07-20 DIAGNOSIS — F17210 Nicotine dependence, cigarettes, uncomplicated: Secondary | ICD-10-CM | POA: Diagnosis not present

## 2020-07-20 DIAGNOSIS — E119 Type 2 diabetes mellitus without complications: Secondary | ICD-10-CM | POA: Insufficient documentation

## 2020-07-20 DIAGNOSIS — J449 Chronic obstructive pulmonary disease, unspecified: Secondary | ICD-10-CM | POA: Diagnosis not present

## 2020-07-20 MED ORDER — LIDOCAINE 5 % EX PTCH: 1.0000 | MEDICATED_PATCH | CUTANEOUS | 0 refills | Status: DC

## 2020-07-20 MED ORDER — LIDOCAINE 5 % EX PTCH
2.0000 | MEDICATED_PATCH | CUTANEOUS | Status: DC
  Administered 2020-07-20: 2 via TRANSDERMAL
  Filled 2020-07-20: qty 2
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
## 2020-07-22 ENCOUNTER — Ambulatory Visit: Admit: 2020-07-22 | Payer: MEDICARE | Attending: Family Medicine | Primary: Family Medicine
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
## 2020-07-26 ENCOUNTER — Ambulatory Visit: Admit: 2020-07-26 | Discharge: 2020-07-27 | Payer: MEDICARE

## 2020-07-26 DIAGNOSIS — J449 Chronic obstructive pulmonary disease, unspecified: Principal | ICD-10-CM

## 2020-07-26 DIAGNOSIS — R0602 Shortness of breath: Principal | ICD-10-CM

## 2020-07-26 DIAGNOSIS — I2721 Secondary pulmonary arterial hypertension: Principal | ICD-10-CM

## 2020-07-26 DIAGNOSIS — I5189 Other ill-defined heart diseases: Principal | ICD-10-CM

## 2020-07-27 DIAGNOSIS — G603 Idiopathic progressive neuropathy: Principal | ICD-10-CM

## 2020-07-27 DIAGNOSIS — M8949 Other hypertrophic osteoarthropathy, multiple sites: Principal | ICD-10-CM

## 2020-07-27 DIAGNOSIS — I10 Essential (primary) hypertension: Principal | ICD-10-CM

## 2020-07-27 DIAGNOSIS — M170 Bilateral primary osteoarthritis of knee: Principal | ICD-10-CM

## 2020-07-27 DIAGNOSIS — E1149 Type 2 diabetes mellitus with other diabetic neurological complication: Principal | ICD-10-CM

## 2020-07-27 DIAGNOSIS — R531 Weakness: Principal | ICD-10-CM

## 2020-07-28 ENCOUNTER — Ambulatory Visit: Admit: 2020-07-28 | Discharge: 2020-07-29 | Payer: MEDICARE

## 2020-07-28 DIAGNOSIS — C675 Malignant neoplasm of bladder neck: Principal | ICD-10-CM

## 2020-07-28 DIAGNOSIS — R001 Bradycardia, unspecified: Principal | ICD-10-CM

## 2020-07-28 DIAGNOSIS — G609 Hereditary and idiopathic neuropathy, unspecified: Principal | ICD-10-CM

## 2020-07-28 DIAGNOSIS — E1149 Type 2 diabetes mellitus with other diabetic neurological complication: Principal | ICD-10-CM

## 2020-07-28 DIAGNOSIS — I10 Essential (primary) hypertension: Principal | ICD-10-CM

## 2020-07-28 DIAGNOSIS — J439 Emphysema, unspecified: Principal | ICD-10-CM

## 2020-07-28 MED ORDER — TRAMADOL 50 MG TABLET
ORAL_TABLET | Freq: Four times a day (QID) | ORAL | 1 refills | 15.00000 days | Status: CP | PRN
Start: 2020-07-28 — End: ?

## 2020-07-28 MED ORDER — CYCLOBENZAPRINE 5 MG TABLET
ORAL_TABLET | Freq: Every evening | ORAL | 1 refills | 30 days | Status: CP | PRN
Start: 2020-07-28 — End: ?

## 2020-07-29 ENCOUNTER — Ambulatory Visit: Admit: 2020-07-29 | Discharge: 2020-07-30 | Payer: MEDICARE

## 2020-07-29 DIAGNOSIS — E1149 Type 2 diabetes mellitus with other diabetic neurological complication: Principal | ICD-10-CM

## 2020-08-02 ENCOUNTER — Ambulatory Visit: Admit: 2020-08-02 | Discharge: 2020-08-03 | Payer: MEDICARE

## 2020-08-02 MED ORDER — CYCLOBENZAPRINE 5 MG TABLET
ORAL_TABLET | Freq: Every evening | ORAL | 1 refills | 30.00000 days | Status: CP | PRN
Start: 2020-08-02 — End: ?

## 2020-08-02 MED ORDER — FUROSEMIDE 20 MG TABLET
ORAL_TABLET | Freq: Every day | ORAL | 11 refills | 30 days | Status: CP
Start: 2020-08-02 — End: 2021-08-02

## 2020-08-04 ENCOUNTER — Ambulatory Visit: Admit: 2020-08-04 | Discharge: 2020-08-05 | Payer: MEDICARE | Attending: Family Medicine | Primary: Family Medicine

## 2020-08-04 ENCOUNTER — Ambulatory Visit
Admit: 2020-08-04 | Discharge: 2020-08-05 | Payer: MEDICARE | Attending: Student in an Organized Health Care Education/Training Program | Primary: Student in an Organized Health Care Education/Training Program

## 2020-08-04 MED ORDER — AMLODIPINE 5 MG TABLET
ORAL_TABLET | Freq: Every day | ORAL | 11 refills | 30.00000 days | Status: CP
Start: 2020-08-04 — End: 2021-08-04

## 2020-08-17 ENCOUNTER — Ambulatory Visit: Admit: 2020-08-17 | Discharge: 2020-08-18 | Payer: MEDICARE

## 2020-08-17 MED ORDER — DICLOFENAC 1 % TOPICAL GEL
Freq: Four times a day (QID) | TOPICAL | 0 refills | 13.00000 days | PRN
Start: 2020-08-17 — End: 2021-08-17

## 2020-08-17 MED ORDER — AMLODIPINE 5 MG TABLET
ORAL_TABLET | Freq: Every day | ORAL | 11 refills | 30.00000 days | Status: CP
Start: 2020-08-17 — End: 2021-08-17

## 2020-08-17 MED ORDER — LOSARTAN 100 MG TABLET
ORAL_TABLET | Freq: Every day | ORAL | 1 refills | 90 days | Status: CP
Start: 2020-08-17 — End: ?

## 2020-08-31 ENCOUNTER — Ambulatory Visit: Admit: 2020-08-31 | Discharge: 2020-08-31 | Payer: MEDICARE

## 2020-08-31 DIAGNOSIS — I10 Essential (primary) hypertension: Principal | ICD-10-CM

## 2020-08-31 DIAGNOSIS — R001 Bradycardia, unspecified: Principal | ICD-10-CM

## 2020-08-31 MED ORDER — AMLODIPINE 5 MG TABLET
ORAL_TABLET | Freq: Every day | ORAL | 11 refills | 30 days | Status: CP
Start: 2020-08-31 — End: 2021-08-31

## 2020-09-07 DIAGNOSIS — E1149 Type 2 diabetes mellitus with other diabetic neurological complication: Principal | ICD-10-CM

## 2020-09-07 DIAGNOSIS — F250 Schizoaffective disorder, bipolar type: Principal | ICD-10-CM

## 2020-09-07 DIAGNOSIS — I10 Essential (primary) hypertension: Principal | ICD-10-CM

## 2020-09-08 ENCOUNTER — Ambulatory Visit: Admit: 2020-09-08 | Attending: Audiologist | Primary: Audiologist

## 2020-09-15 ENCOUNTER — Ambulatory Visit
Admit: 2020-09-15 | Discharge: 2020-09-16 | Payer: MEDICARE | Attending: Student in an Organized Health Care Education/Training Program | Primary: Student in an Organized Health Care Education/Training Program

## 2020-09-20 ENCOUNTER — Ambulatory Visit
Admit: 2020-09-20 | Payer: MEDICARE | Attending: Student in an Organized Health Care Education/Training Program | Primary: Student in an Organized Health Care Education/Training Program

## 2020-09-21 ENCOUNTER — Ambulatory Visit: Admit: 2020-09-21 | Discharge: 2020-09-22 | Payer: MEDICARE

## 2020-09-21 ENCOUNTER — Ambulatory Visit: Admit: 2020-09-21 | Discharge: 2020-09-21 | Payer: MEDICARE | Attending: Adult Health | Primary: Adult Health

## 2020-09-21 DIAGNOSIS — F17200 Nicotine dependence, unspecified, uncomplicated: Principal | ICD-10-CM

## 2020-09-21 DIAGNOSIS — I10 Essential (primary) hypertension: Principal | ICD-10-CM

## 2020-09-21 DIAGNOSIS — I493 Ventricular premature depolarization: Principal | ICD-10-CM

## 2020-09-23 ENCOUNTER — Ambulatory Visit
Admit: 2020-09-23 | Discharge: 2020-09-24 | Payer: MEDICARE | Attending: Student in an Organized Health Care Education/Training Program | Primary: Student in an Organized Health Care Education/Training Program

## 2020-09-23 DIAGNOSIS — F250 Schizoaffective disorder, bipolar type: Principal | ICD-10-CM

## 2020-09-28 ENCOUNTER — Ambulatory Visit: Admit: 2020-09-28 | Discharge: 2020-09-28 | Payer: MEDICARE

## 2020-09-28 DIAGNOSIS — C679 Malignant neoplasm of bladder, unspecified: Principal | ICD-10-CM

## 2020-09-28 DIAGNOSIS — C670 Malignant neoplasm of trigone of bladder: Principal | ICD-10-CM

## 2020-10-04 ENCOUNTER — Ambulatory Visit: Admit: 2020-10-04 | Discharge: 2020-10-05 | Payer: MEDICARE

## 2020-10-04 DIAGNOSIS — I10 Essential (primary) hypertension: Principal | ICD-10-CM

## 2020-10-04 DIAGNOSIS — M1990 Unspecified osteoarthritis, unspecified site: Principal | ICD-10-CM

## 2020-10-04 MED ORDER — AMLODIPINE 10 MG TABLET
ORAL_TABLET | Freq: Every day | ORAL | 11 refills | 30 days | Status: CP
Start: 2020-10-04 — End: 2020-10-04

## 2020-10-04 MED ORDER — AMLODIPINE 5 MG TABLET
ORAL_TABLET | Freq: Every day | ORAL | 11 refills | 30.00000 days | Status: CP
Start: 2020-10-04 — End: 2021-10-04

## 2020-10-04 MED ORDER — TRAMADOL 50 MG TABLET
ORAL_TABLET | Freq: Four times a day (QID) | ORAL | 5 refills | 15 days | Status: CP | PRN
Start: 2020-10-04 — End: ?

## 2020-10-04 MED ORDER — DICLOFENAC 1 % TOPICAL GEL
Freq: Four times a day (QID) | TOPICAL | 0 refills | 13.00000 days | PRN
Start: 2020-10-04 — End: 2021-10-04

## 2020-10-19 ENCOUNTER — Ambulatory Visit: Admit: 2020-10-19 | Discharge: 2020-10-19 | Payer: MEDICARE | Attending: Adult Health | Primary: Adult Health

## 2020-10-19 DIAGNOSIS — I10 Essential (primary) hypertension: Principal | ICD-10-CM

## 2020-10-19 DIAGNOSIS — I493 Ventricular premature depolarization: Principal | ICD-10-CM

## 2020-10-19 DIAGNOSIS — F17200 Nicotine dependence, unspecified, uncomplicated: Principal | ICD-10-CM

## 2020-10-21 MED ORDER — FLUPHENAZINE DECANOATE 25 MG/ML INJECTION SOLUTION
INTRAMUSCULAR | 3 refills | 60.00000 days | Status: CP
Start: 2020-10-21 — End: ?

## 2020-10-28 ENCOUNTER — Ambulatory Visit: Admit: 2020-10-28 | Discharge: 2020-10-28 | Payer: MEDICARE

## 2020-10-28 DIAGNOSIS — G4489 Other headache syndrome: Principal | ICD-10-CM

## 2020-10-28 DIAGNOSIS — M542 Cervicalgia: Principal | ICD-10-CM

## 2020-10-28 MED ORDER — CYCLOBENZAPRINE 5 MG TABLET
ORAL_TABLET | Freq: Every evening | ORAL | 5 refills | 30.00000 days | Status: CP
Start: 2020-10-28 — End: ?

## 2020-11-01 ENCOUNTER — Ambulatory Visit: Admit: 2020-11-01 | Payer: MEDICARE

## 2020-11-05 DIAGNOSIS — M1990 Unspecified osteoarthritis, unspecified site: Principal | ICD-10-CM

## 2020-11-05 MED ORDER — DICLOFENAC 1 % TOPICAL GEL
Freq: Four times a day (QID) | TOPICAL | 2 refills | 13 days | PRN
Start: 2020-11-05 — End: 2021-11-05

## 2020-11-08 ENCOUNTER — Ambulatory Visit: Admit: 2020-11-08 | Discharge: 2020-11-09 | Payer: MEDICARE

## 2020-11-08 DIAGNOSIS — F250 Schizoaffective disorder, bipolar type: Principal | ICD-10-CM

## 2020-11-08 DIAGNOSIS — E1149 Type 2 diabetes mellitus with other diabetic neurological complication: Principal | ICD-10-CM

## 2020-11-08 DIAGNOSIS — M4802 Spinal stenosis, cervical region: Principal | ICD-10-CM

## 2020-11-08 DIAGNOSIS — J439 Emphysema, unspecified: Principal | ICD-10-CM

## 2020-11-08 DIAGNOSIS — M164 Bilateral post-traumatic osteoarthritis of hip: Principal | ICD-10-CM

## 2020-11-08 DIAGNOSIS — F17200 Nicotine dependence, unspecified, uncomplicated: Principal | ICD-10-CM

## 2020-11-08 DIAGNOSIS — M4712 Other spondylosis with myelopathy, cervical region: Principal | ICD-10-CM

## 2020-11-08 DIAGNOSIS — G44229 Chronic tension-type headache, not intractable: Principal | ICD-10-CM

## 2020-11-08 DIAGNOSIS — I10 Essential (primary) hypertension: Principal | ICD-10-CM

## 2020-11-08 MED ORDER — AMLODIPINE 2.5 MG TABLET
ORAL_TABLET | Freq: Every day | ORAL | 11 refills | 30.00000 days | Status: CP
Start: 2020-11-08 — End: 2021-01-17

## 2020-12-06 ENCOUNTER — Ambulatory Visit: Admit: 2020-12-06 | Discharge: 2020-12-07 | Payer: MEDICARE

## 2020-12-06 DIAGNOSIS — I10 Essential (primary) hypertension: Principal | ICD-10-CM

## 2020-12-06 DIAGNOSIS — G4489 Other headache syndrome: Principal | ICD-10-CM

## 2020-12-06 DIAGNOSIS — Z794 Long term (current) use of insulin: Principal | ICD-10-CM

## 2020-12-06 DIAGNOSIS — M542 Cervicalgia: Principal | ICD-10-CM

## 2020-12-06 DIAGNOSIS — F17200 Nicotine dependence, unspecified, uncomplicated: Principal | ICD-10-CM

## 2020-12-06 DIAGNOSIS — E119 Type 2 diabetes mellitus without complications: Principal | ICD-10-CM

## 2020-12-06 DIAGNOSIS — E785 Hyperlipidemia, unspecified: Principal | ICD-10-CM

## 2020-12-08 ENCOUNTER — Ambulatory Visit: Admit: 2020-12-08 | Discharge: 2020-12-09 | Payer: MEDICARE

## 2020-12-08 DIAGNOSIS — I10 Essential (primary) hypertension: Principal | ICD-10-CM

## 2020-12-08 DIAGNOSIS — I6529 Occlusion and stenosis of unspecified carotid artery: Principal | ICD-10-CM

## 2020-12-08 DIAGNOSIS — G589 Mononeuropathy, unspecified: Principal | ICD-10-CM

## 2020-12-08 DIAGNOSIS — C670 Malignant neoplasm of trigone of bladder: Principal | ICD-10-CM

## 2020-12-08 DIAGNOSIS — F17200 Nicotine dependence, unspecified, uncomplicated: Principal | ICD-10-CM

## 2020-12-19 ENCOUNTER — Institutional Professional Consult (permissible substitution): Admit: 2020-12-19 | Discharge: 2020-12-20 | Payer: MEDICARE

## 2020-12-19 DIAGNOSIS — G59 Mononeuropathy in diseases classified elsewhere: Principal | ICD-10-CM

## 2020-12-19 DIAGNOSIS — G44229 Chronic tension-type headache, not intractable: Principal | ICD-10-CM

## 2020-12-19 DIAGNOSIS — F17200 Nicotine dependence, unspecified, uncomplicated: Principal | ICD-10-CM

## 2020-12-19 DIAGNOSIS — M4802 Spinal stenosis, cervical region: Principal | ICD-10-CM

## 2020-12-19 DIAGNOSIS — J438 Other emphysema: Principal | ICD-10-CM

## 2020-12-19 DIAGNOSIS — I10 Essential (primary) hypertension: Principal | ICD-10-CM

## 2020-12-19 MED ORDER — GABAPENTIN 100 MG CAPSULE
ORAL_CAPSULE | Freq: Three times a day (TID) | ORAL | 3 refills | 90 days | Status: CP
Start: 2020-12-19 — End: 2021-12-19

## 2021-01-17 ENCOUNTER — Ambulatory Visit: Admit: 2021-01-17 | Discharge: 2021-01-18 | Payer: MEDICARE

## 2021-01-17 DIAGNOSIS — G8929 Other chronic pain: Principal | ICD-10-CM

## 2021-01-17 DIAGNOSIS — M25512 Pain in left shoulder: Principal | ICD-10-CM

## 2021-01-17 DIAGNOSIS — E1149 Type 2 diabetes mellitus with other diabetic neurological complication: Principal | ICD-10-CM

## 2021-01-17 DIAGNOSIS — I10 Essential (primary) hypertension: Principal | ICD-10-CM

## 2021-01-17 DIAGNOSIS — M4802 Spinal stenosis, cervical region: Principal | ICD-10-CM

## 2021-01-17 DIAGNOSIS — G44039 Episodic paroxysmal hemicrania, not intractable: Principal | ICD-10-CM

## 2021-01-17 DIAGNOSIS — G609 Hereditary and idiopathic neuropathy, unspecified: Principal | ICD-10-CM

## 2021-01-17 MED ORDER — FUROSEMIDE 20 MG TABLET
ORAL_TABLET | Freq: Every day | ORAL | 11 refills | 30 days | Status: CP
Start: 2021-01-17 — End: 2022-01-17

## 2021-01-18 ENCOUNTER — Ambulatory Visit: Admit: 2021-01-18 | Discharge: 2021-01-19 | Payer: MEDICARE

## 2021-01-18 DIAGNOSIS — E1149 Type 2 diabetes mellitus with other diabetic neurological complication: Principal | ICD-10-CM

## 2021-01-18 DIAGNOSIS — I10 Essential (primary) hypertension: Principal | ICD-10-CM

## 2021-01-19 ENCOUNTER — Ambulatory Visit: Admit: 2021-01-19 | Discharge: 2021-01-20 | Payer: MEDICARE

## 2021-01-19 DIAGNOSIS — Z4789 Encounter for other orthopedic aftercare: Principal | ICD-10-CM

## 2021-01-19 DIAGNOSIS — M25512 Pain in left shoulder: Principal | ICD-10-CM

## 2021-01-19 DIAGNOSIS — M5412 Radiculopathy, cervical region: Principal | ICD-10-CM

## 2021-01-19 DIAGNOSIS — Z96619 Presence of unspecified artificial shoulder joint: Principal | ICD-10-CM

## 2021-01-19 DIAGNOSIS — G8929 Other chronic pain: Principal | ICD-10-CM

## 2021-01-19 DIAGNOSIS — Z9889 Other specified postprocedural states: Principal | ICD-10-CM

## 2021-01-26 ENCOUNTER — Ambulatory Visit: Admit: 2021-01-26 | Discharge: 2021-02-08 | Payer: MEDICARE

## 2021-01-26 DIAGNOSIS — G8929 Other chronic pain: Principal | ICD-10-CM

## 2021-01-26 DIAGNOSIS — Z96612 Presence of left artificial shoulder joint: Principal | ICD-10-CM

## 2021-01-26 DIAGNOSIS — M25512 Pain in left shoulder: Principal | ICD-10-CM

## 2021-01-26 DIAGNOSIS — Z9889 Other specified postprocedural states: Principal | ICD-10-CM

## 2021-01-31 ENCOUNTER — Institutional Professional Consult (permissible substitution): Admit: 2021-01-31 | Discharge: 2021-02-01 | Payer: MEDICARE

## 2021-01-31 DIAGNOSIS — Z9889 Other specified postprocedural states: Principal | ICD-10-CM

## 2021-02-02 ENCOUNTER — Ambulatory Visit: Admit: 2021-02-02 | Discharge: 2021-02-03 | Payer: MEDICARE

## 2021-02-02 DIAGNOSIS — M5031 Other cervical disc degeneration,  high cervical region: Principal | ICD-10-CM

## 2021-02-02 DIAGNOSIS — G959 Disease of spinal cord, unspecified: Principal | ICD-10-CM

## 2021-02-02 DIAGNOSIS — E1140 Type 2 diabetes mellitus with diabetic neuropathy, unspecified: Principal | ICD-10-CM

## 2021-02-02 DIAGNOSIS — M25512 Pain in left shoulder: Principal | ICD-10-CM

## 2021-02-02 DIAGNOSIS — I10 Essential (primary) hypertension: Principal | ICD-10-CM

## 2021-02-02 DIAGNOSIS — G8929 Other chronic pain: Principal | ICD-10-CM

## 2021-02-02 DIAGNOSIS — M4722 Other spondylosis with radiculopathy, cervical region: Principal | ICD-10-CM

## 2021-02-02 DIAGNOSIS — F17210 Nicotine dependence, cigarettes, uncomplicated: Principal | ICD-10-CM

## 2021-02-02 DIAGNOSIS — M5412 Radiculopathy, cervical region: Principal | ICD-10-CM

## 2021-02-02 DIAGNOSIS — J449 Chronic obstructive pulmonary disease, unspecified: Principal | ICD-10-CM

## 2021-02-16 ENCOUNTER — Ambulatory Visit: Admit: 2021-02-16 | Discharge: 2021-02-17 | Payer: MEDICARE | Attending: Family | Primary: Family

## 2021-02-16 DIAGNOSIS — M7918 Myalgia, other site: Principal | ICD-10-CM

## 2021-02-16 DIAGNOSIS — G4440 Drug-induced headache, not elsewhere classified, not intractable: Principal | ICD-10-CM

## 2021-02-16 DIAGNOSIS — M47812 Spondylosis without myelopathy or radiculopathy, cervical region: Principal | ICD-10-CM

## 2021-02-16 DIAGNOSIS — G44229 Chronic tension-type headache, not intractable: Principal | ICD-10-CM

## 2021-02-16 MED ORDER — CYCLOBENZAPRINE 5 MG TABLET
ORAL_TABLET | Freq: Every evening | ORAL | 5 refills | 30.00000 days | Status: CP
Start: 2021-02-16 — End: ?

## 2021-02-23 ENCOUNTER — Ambulatory Visit: Admit: 2021-02-23 | Discharge: 2021-02-24 | Payer: MEDICARE

## 2021-02-23 ENCOUNTER — Ambulatory Visit: Admit: 2021-02-23 | Discharge: 2021-02-24

## 2021-03-07 ENCOUNTER — Ambulatory Visit: Admit: 2021-03-07 | Payer: MEDICARE

## 2021-03-24 ENCOUNTER — Ambulatory Visit
Admit: 2021-03-24 | Discharge: 2021-04-22 | Payer: MEDICARE | Attending: Rehabilitative and Restorative Service Providers" | Primary: Rehabilitative and Restorative Service Providers"

## 2021-03-24 ENCOUNTER — Ambulatory Visit
Admit: 2021-03-24 | Discharge: 2021-03-25 | Payer: MEDICARE | Attending: Student in an Organized Health Care Education/Training Program | Primary: Student in an Organized Health Care Education/Training Program

## 2021-03-24 ENCOUNTER — Ambulatory Visit: Admit: 2021-03-24 | Discharge: 2021-04-22 | Payer: MEDICARE

## 2021-03-24 MED ORDER — MIRTAZAPINE 7.5 MG TABLET
ORAL_TABLET | Freq: Every evening | ORAL | 1 refills | 90 days | Status: CP
Start: 2021-03-24 — End: ?

## 2021-03-27 ENCOUNTER — Ambulatory Visit: Admit: 2021-03-27 | Payer: MEDICARE

## 2021-04-06 ENCOUNTER — Ambulatory Visit: Admit: 2021-04-06 | Discharge: 2021-04-07 | Payer: MEDICARE

## 2021-04-06 DIAGNOSIS — N182 Chronic kidney disease, stage 2 (mild): Principal | ICD-10-CM

## 2021-04-06 DIAGNOSIS — G44039 Episodic paroxysmal hemicrania, not intractable: Principal | ICD-10-CM

## 2021-04-06 DIAGNOSIS — C670 Malignant neoplasm of trigone of bladder: Principal | ICD-10-CM

## 2021-04-06 DIAGNOSIS — F17200 Nicotine dependence, unspecified, uncomplicated: Principal | ICD-10-CM

## 2021-04-06 DIAGNOSIS — I10 Essential (primary) hypertension: Principal | ICD-10-CM

## 2021-04-06 DIAGNOSIS — F250 Schizoaffective disorder, bipolar type: Principal | ICD-10-CM

## 2021-04-06 DIAGNOSIS — E1149 Type 2 diabetes mellitus with other diabetic neurological complication: Principal | ICD-10-CM

## 2021-04-06 DIAGNOSIS — J438 Other emphysema: Principal | ICD-10-CM

## 2021-04-09 DIAGNOSIS — R059 Cough: Principal | ICD-10-CM

## 2021-04-09 DIAGNOSIS — J189 Pneumonia, unspecified organism: Principal | ICD-10-CM

## 2021-04-10 ENCOUNTER — Ambulatory Visit: Admit: 2021-04-10 | Discharge: 2021-04-11 | Payer: MEDICARE

## 2021-04-10 MED ORDER — ALBUTEROL SULFATE HFA 90 MCG/ACTUATION AEROSOL INHALER
2 refills | 0 days | Status: CP
Start: 2021-04-10 — End: ?

## 2021-04-11 DIAGNOSIS — G959 Disease of spinal cord, unspecified: Principal | ICD-10-CM

## 2021-04-11 DIAGNOSIS — M5412 Radiculopathy, cervical region: Principal | ICD-10-CM

## 2021-04-12 ENCOUNTER — Ambulatory Visit: Admit: 2021-04-12 | Discharge: 2021-04-13 | Payer: MEDICARE | Attending: Clinical | Primary: Clinical

## 2021-04-12 DIAGNOSIS — F250 Schizoaffective disorder, bipolar type: Principal | ICD-10-CM

## 2021-05-03 ENCOUNTER — Ambulatory Visit
Admit: 2021-05-03 | Discharge: 2021-05-22 | Payer: MEDICARE | Attending: Rehabilitative and Restorative Service Providers" | Primary: Rehabilitative and Restorative Service Providers"

## 2021-05-17 ENCOUNTER — Ambulatory Visit: Admit: 2021-05-17 | Discharge: 2021-05-18 | Payer: MEDICARE

## 2021-05-17 DIAGNOSIS — E1149 Type 2 diabetes mellitus with other diabetic neurological complication: Principal | ICD-10-CM

## 2021-05-17 DIAGNOSIS — F17200 Nicotine dependence, unspecified, uncomplicated: Principal | ICD-10-CM

## 2021-05-17 DIAGNOSIS — J438 Other emphysema: Principal | ICD-10-CM

## 2021-05-17 DIAGNOSIS — F250 Schizoaffective disorder, bipolar type: Principal | ICD-10-CM

## 2021-05-17 DIAGNOSIS — D508 Other iron deficiency anemias: Principal | ICD-10-CM

## 2021-05-17 DIAGNOSIS — I10 Essential (primary) hypertension: Principal | ICD-10-CM

## 2021-05-17 DIAGNOSIS — G44229 Chronic tension-type headache, not intractable: Principal | ICD-10-CM

## 2021-05-17 DIAGNOSIS — G609 Hereditary and idiopathic neuropathy, unspecified: Principal | ICD-10-CM

## 2021-06-01 ENCOUNTER — Ambulatory Visit: Admit: 2021-06-01 | Discharge: 2021-06-02 | Payer: MEDICARE

## 2021-06-01 DIAGNOSIS — G959 Disease of spinal cord, unspecified: Principal | ICD-10-CM

## 2021-06-01 DIAGNOSIS — M5412 Radiculopathy, cervical region: Principal | ICD-10-CM

## 2021-07-11 ENCOUNTER — Ambulatory Visit: Admit: 2021-07-11 | Discharge: 2021-07-12 | Payer: MEDICARE

## 2021-07-11 MED ORDER — GABAPENTIN 100 MG CAPSULE
ORAL_CAPSULE | 3 refills | 0 days | Status: CP
Start: 2021-07-11 — End: ?

## 2021-08-03 DIAGNOSIS — I10 Essential (primary) hypertension: Principal | ICD-10-CM

## 2021-08-03 DIAGNOSIS — E1149 Type 2 diabetes mellitus with other diabetic neurological complication: Principal | ICD-10-CM

## 2021-08-10 ENCOUNTER — Ambulatory Visit: Admit: 2021-08-10 | Discharge: 2021-08-11 | Payer: MEDICARE

## 2021-08-10 MED ORDER — CYCLOBENZAPRINE 5 MG TABLET
ORAL_TABLET | Freq: Every evening | ORAL | 5 refills | 30 days | Status: CP
Start: 2021-08-10 — End: ?

## 2021-08-24 ENCOUNTER — Ambulatory Visit: Admit: 2021-08-24 | Discharge: 2021-08-25 | Payer: MEDICARE | Attending: Family | Primary: Family

## 2021-09-07 ENCOUNTER — Ambulatory Visit: Admit: 2021-09-07 | Discharge: 2021-09-08 | Payer: MEDICARE

## 2021-09-07 MED ORDER — GABAPENTIN 100 MG CAPSULE
ORAL_CAPSULE | Freq: Three times a day (TID) | ORAL | 2 refills | 30.00000 days | Status: CP
Start: 2021-09-07 — End: 2021-12-06

## 2021-09-21 ENCOUNTER — Ambulatory Visit: Admit: 2021-09-21 | Discharge: 2021-09-22 | Payer: MEDICARE

## 2021-09-27 ENCOUNTER — Ambulatory Visit: Admit: 2021-09-27 | Discharge: 2021-09-28 | Payer: MEDICARE

## 2021-09-27 DIAGNOSIS — I10 Essential (primary) hypertension: Principal | ICD-10-CM

## 2021-09-27 DIAGNOSIS — I361 Nonrheumatic tricuspid (valve) insufficiency: Principal | ICD-10-CM

## 2021-09-27 DIAGNOSIS — F17200 Nicotine dependence, unspecified, uncomplicated: Principal | ICD-10-CM

## 2021-09-27 MED ORDER — AMLODIPINE 10 MG TABLET
ORAL_TABLET | Freq: Every day | ORAL | 11 refills | 30.00000 days | Status: CP
Start: 2021-09-27 — End: 2021-10-27

## 2021-09-28 ENCOUNTER — Ambulatory Visit: Admit: 2021-09-28 | Payer: MEDICARE

## 2021-09-28 ENCOUNTER — Ambulatory Visit
Admit: 2021-09-28 | Payer: MEDICARE | Attending: Rehabilitative and Restorative Service Providers" | Primary: Rehabilitative and Restorative Service Providers"

## 2021-10-02 DIAGNOSIS — C670 Malignant neoplasm of trigone of bladder: Principal | ICD-10-CM

## 2021-10-02 DIAGNOSIS — C679 Malignant neoplasm of bladder, unspecified: Principal | ICD-10-CM

## 2021-10-03 ENCOUNTER — Ambulatory Visit: Admit: 2021-10-03 | Discharge: 2021-10-04 | Payer: MEDICARE

## 2021-10-04 ENCOUNTER — Ambulatory Visit: Admit: 2021-10-04 | Discharge: 2021-10-05 | Payer: MEDICARE

## 2021-10-04 DIAGNOSIS — Z8551 Personal history of malignant neoplasm of bladder: Principal | ICD-10-CM

## 2021-10-24 ENCOUNTER — Ambulatory Visit: Admit: 2021-10-24 | Discharge: 2021-10-25 | Payer: MEDICARE | Attending: Anesthesiology | Primary: Anesthesiology

## 2021-10-24 DIAGNOSIS — G44229 Chronic tension-type headache, not intractable: Principal | ICD-10-CM

## 2021-10-24 DIAGNOSIS — G6289 Other specified polyneuropathies: Principal | ICD-10-CM

## 2021-10-24 DIAGNOSIS — M4712 Other spondylosis with myelopathy, cervical region: Principal | ICD-10-CM

## 2021-10-24 DIAGNOSIS — G4440 Drug-induced headache, not elsewhere classified, not intractable: Principal | ICD-10-CM

## 2021-10-24 DIAGNOSIS — M7918 Myalgia, other site: Principal | ICD-10-CM

## 2021-10-24 DIAGNOSIS — M1710 Unilateral primary osteoarthritis, unspecified knee: Principal | ICD-10-CM

## 2021-10-24 MED ORDER — DULOXETINE 20 MG CAPSULE,DELAYED RELEASE
ORAL_CAPSULE | Freq: Every day | ORAL | 0 refills | 90 days | Status: CP
Start: 2021-10-24 — End: ?

## 2021-10-25 ENCOUNTER — Ambulatory Visit: Admit: 2021-10-25 | Discharge: 2021-10-26 | Payer: MEDICARE

## 2021-11-03 ENCOUNTER — Ambulatory Visit
Admit: 2021-11-03 | Discharge: 2021-11-04 | Payer: MEDICARE | Attending: Student in an Organized Health Care Education/Training Program | Primary: Student in an Organized Health Care Education/Training Program

## 2021-11-03 DIAGNOSIS — Z79899 Other long term (current) drug therapy: Principal | ICD-10-CM

## 2021-11-07 MED ORDER — FLUPHENAZINE DECANOATE 25 MG/ML INJECTION SOLUTION
INTRAMUSCULAR | 11 refills | 60 days | Status: CP
Start: 2021-11-07 — End: ?
  Filled 2022-01-29: qty 5, 60d supply, fill #0

## 2021-11-09 ENCOUNTER — Ambulatory Visit: Admit: 2021-11-09 | Discharge: 2021-11-10 | Payer: MEDICARE

## 2021-12-12 ENCOUNTER — Ambulatory Visit: Admit: 2021-12-12 | Payer: MEDICARE

## 2022-01-10 ENCOUNTER — Ambulatory Visit: Admit: 2022-01-10 | Discharge: 2022-01-11 | Payer: MEDICARE

## 2022-01-10 DIAGNOSIS — E1149 Type 2 diabetes mellitus with other diabetic neurological complication: Principal | ICD-10-CM

## 2022-01-10 DIAGNOSIS — G44229 Chronic tension-type headache, not intractable: Principal | ICD-10-CM

## 2022-01-10 DIAGNOSIS — I10 Essential (primary) hypertension: Principal | ICD-10-CM

## 2022-01-10 DIAGNOSIS — G603 Idiopathic progressive neuropathy: Principal | ICD-10-CM

## 2022-01-10 DIAGNOSIS — M158 Other polyosteoarthritis: Principal | ICD-10-CM

## 2022-01-10 DIAGNOSIS — J438 Other emphysema: Principal | ICD-10-CM

## 2022-01-10 DIAGNOSIS — F17200 Nicotine dependence, unspecified, uncomplicated: Principal | ICD-10-CM

## 2022-01-17 ENCOUNTER — Ambulatory Visit: Admit: 2022-01-17 | Discharge: 2022-01-18 | Payer: MEDICARE

## 2022-01-17 DIAGNOSIS — H2513 Age-related nuclear cataract, bilateral: Principal | ICD-10-CM

## 2022-01-17 DIAGNOSIS — H35033 Hypertensive retinopathy, bilateral: Principal | ICD-10-CM

## 2022-01-17 DIAGNOSIS — E119 Type 2 diabetes mellitus without complications: Principal | ICD-10-CM

## 2022-01-24 ENCOUNTER — Ambulatory Visit: Admit: 2022-01-24 | Discharge: 2022-01-24 | Payer: MEDICARE

## 2022-01-24 DIAGNOSIS — I10 Essential (primary) hypertension: Principal | ICD-10-CM

## 2022-01-24 DIAGNOSIS — I361 Nonrheumatic tricuspid (valve) insufficiency: Principal | ICD-10-CM

## 2022-01-24 DIAGNOSIS — F17200 Nicotine dependence, unspecified, uncomplicated: Principal | ICD-10-CM

## 2022-01-24 DIAGNOSIS — I493 Ventricular premature depolarization: Principal | ICD-10-CM

## 2022-01-24 MED ORDER — CANDESARTAN 8 MG TABLET
ORAL_TABLET | Freq: Every day | ORAL | 11 refills | 30.00000 days | Status: CP
Start: 2022-01-24 — End: 2022-02-23

## 2022-01-26 ENCOUNTER — Ambulatory Visit
Admit: 2022-01-26 | Discharge: 2022-01-27 | Payer: MEDICARE | Attending: Student in an Organized Health Care Education/Training Program | Primary: Student in an Organized Health Care Education/Training Program

## 2022-01-26 DIAGNOSIS — M1710 Unilateral primary osteoarthritis, unspecified knee: Principal | ICD-10-CM

## 2022-01-26 DIAGNOSIS — M7918 Myalgia, other site: Principal | ICD-10-CM

## 2022-01-26 DIAGNOSIS — G6289 Other specified polyneuropathies: Principal | ICD-10-CM

## 2022-01-26 DIAGNOSIS — M4712 Other spondylosis with myelopathy, cervical region: Principal | ICD-10-CM

## 2022-01-26 DIAGNOSIS — G4440 Drug-induced headache, not elsewhere classified, not intractable: Principal | ICD-10-CM

## 2022-01-26 DIAGNOSIS — G44229 Chronic tension-type headache, not intractable: Principal | ICD-10-CM

## 2022-01-26 DIAGNOSIS — F250 Schizoaffective disorder, bipolar type: Principal | ICD-10-CM

## 2022-01-26 MED ORDER — DULOXETINE 20 MG CAPSULE,DELAYED RELEASE
ORAL_CAPSULE | Freq: Every day | ORAL | 0 refills | 90 days | Status: CP
Start: 2022-01-26 — End: ?

## 2022-01-26 NOTE — Unmapped (Signed)
[facility] Administered Medication Refill Coordination Note      NAME:Tammy Braun DOB: 11-01-1941      Medication: fluphenazine    Day Supply: 28 days      SHIPPING      Next delivery from [facility] [phone]) to [facility] for [name] is scheduled for 01/29/22.    Clinic contact: Jolene Schimke    Patient's next nurse visit for administration: TBD.    We will follow up with clinic monthly for standard refill processing and delivery.      Arnold Long  Specialty Pharmacy Pharmacist

## 2022-01-26 NOTE — Unmapped (Unsigned)
[facility]  Psychiatry   Established Patient E&M Service - Outpatient       Assessment:    Tammy Braun presents for follow-up evaluation with her daughter. Today 01/26/22, patient endorses continued stability on regimen of Prolixin 50 mg LAI q30 days. Denies side effects and believes that this medication continues to be helpful for management of psychosis. Continues to have intermittent auditory hallucinations right before she receives her LAI, however she notes that she is able to ignore but these appear to be chronic and stable. Patient would like to reconnect with therapy services, however was advised that the [facility] therapy clinic is not currently accepting patients given length of wait list. Online resources for therapy provided. Unclear if patient has been using Cymbalta regularly- daughter endorses taking PRN. Advised daily use of this medication for benefit of mood and chronic pain symptoms. No acute safety concerns. Will plan to have patient start coming to clinic for LAI administration. They will follow up in person in x3 months, sooner if needed. Patient and daughter verbalize understanding and are agreeable with plan.       Identifying Information:  Tammy Braun is a 81 y.o. female with a history of schizoaffective disorder. First presented to STEP clinic September 2021. She has been stable on prolixin 50 mg q30 days for the past 30 years. Per PCP, patient has had worsening of depressive symptoms, though patient did not endorse. Reports auditory hallucinations and denies any CAH. Reports some anxiety related to impatience waiting on others, but finds this manageable. Patient is not interested in any new medications, but could consider increasing the frequency of prolixin administration if needed. March 2022, augmenting with mirtazapine for low mood and sleep. Could also consider augmenting with SRNI or wellbutrin for depression.     Risk Assessment:  A full suicide and violence risk assessment was performed as part of this patient's initial evaluation with [facility] outpatient psychiatry.  There is no new acute risk for suicide or violence at this time. The patient was educated about relevant modifiable risk factors including following recommendations for treatment of psychiatric illness and abstaining from substance abuse.   While future psychiatric events cannot be accurately predicted, the patient does not currently require acute inpatient psychiatric care and does not currently meet [facility] involuntary commitment criteria.      Plan:    Problem: Schizoaffective disorder  Status of problem: chronic and stable  Interventions:     - Continue Prolixin 50 mg IM q30 days; could consider increasing frequency of injections in the future; [facility]   - Restart Cymbalta 20 mg daily   - Pt is interested in therapy. Will reach out to [facility] who patient has seen previously.     #High Risk Medication Monitoring   Lab drawn 11/03/21: LDL 107  Lab drawn 05/17/21: 6.5      Psychotherapy provided:  No billable psychotherapy service provided.    Patient has been given this writer's contact information as well as the [facility] urgent line number. The patient has been instructed to call 911 for emergencies.    Patient was seen and plan of care was discussed with the Attending MD,Dr. Bunnie Domino, who agrees with the above statement and plan.      Subjective:    Interval History:   Patient reports that she has been worried about my past. Patient reports that her sister recently passed. Reports that  this has been making her feel sad and reflective on her past. Denies issues with LAI, however daughter in room endorses desire to have injections done at this clinic. Patient is due for the next injection on the 6th of February. Patient continues to take Cymbalta, patient initially thought she should use it PRN for pain, however was advised that she should be taking daily.     Daughter in room that symptoms have been about the same. Daughter in room would like patient to be more sociable. [place] has difficulty getting patient out and about. For fun, patient likes to go out shopping. Spending time with church people.    Patient endorses some emergence of psychotic symptoms including auditory hallucinations right before she is scheduled for her next injection, however these are chronic and she is able to ignore them. No acute safety concerns. Will follow up in x3 months, sooner if needed.       Objective:    Mental Status Exam:  Appearance:    Appears stated age, Well nourished and Well developed   Motor:   No abnormal movements   Speech/Language:    Normal rate, volume, tone, fluency and Impaired articulation   Mood:   Worried about my past   Affect:   Calm, Cooperative and Euthymic   Thought process and Associations:   Logical, linear, clear, coherent, goal directed   Abnormal/psychotic thought content:     Denies SI, HI, self harm, delusions, obsessions, paranoid ideation, or ideas of reference   Perceptual disturbances:     Endorses auditory hallucinations towards the end of the month before she is due for her next injection   Other:   Insight, judgment, and impulse control are fair         Visit was completed face to face.      Lovett Calender, MD  01/26/22 content:     Denies SI, HI, self harm, delusions, obsessions, paranoid ideation, or ideas of reference   Perceptual disturbances:     Endorses auditory hallucinations towards the end of the month before she is due for her next injection   Other:   Insight, judgment, and impulse control are fair         Visit was completed face to face.      Lovett Calender, MD  01/26/22

## 2022-01-26 NOTE — Unmapped (Addendum)
Thank you for coming to your appointment today!    1) Follow up: April 14th at 11:15  2) We will call you early next week to scheduled your injection for next week   3) Re-start Cymbalta 20 mg daily   4) Check [email or web address] for available therapists     Follow-up instructions:  -- Please continue taking your medications as prescribed for your mental health.   -- Do not make changes to your medications, including taking more or less than prescribed, unless under the supervision of your physician. Be aware that some medications may make you feel worse if abruptly stopped  -- Please refrain from using illicit substances, as these can affect your mood and could cause anxiety or other concerning symptoms.   -- Seek further medical care for any increase in symptoms or new symptoms such as thoughts of wanting to hurt yourself or hurt others.     Contact info:  Life-threatening emergencies: call 911 or go to the nearest ER for medical or psychiatric attention.     Issues that need urgent attention but are not life threatening: call the clinic at [phone].    Non-urgent routine concerns, questions, and refill requests: please call the clinic for assistance, or you may call my voicemail at [phone], and I will reply within 2 business days.     Regarding appointments:  - If you need to cancel your appointment, we ask that you call [phone] at least 24 hours before your scheduled appointment.  - If for any reason you arrive 15 minutes later than your scheduled appointment time, you may not be seen and your visit may be rescheduled.  - Please remember that we will not automatically reschedule missed appointments.  - If you miss two (2) appointments without letting us know at least 24 hours in advance, you will likely be referred to a provider in your community.  - We will do our best to be on time. Sometimes an emergency will arise that might cause your clinician to be late. We will try to inform you of this when you check in for your appointment. If you wait more than 15 minutes past your appointment time without such notice, please speak with the front desk staff.    In the event of bad weather, the clinic staff will attempt to contact you, should your appointment need to be rescheduled. Additionally, you can call the Patient Weather Line [phone] for system-wide clinic status    For more information and reminders regarding clinic policies (these were provided when you were admitted to the clinic), please ask the front desk.

## 2022-01-29 DIAGNOSIS — F250 Schizoaffective disorder, bipolar type: Principal | ICD-10-CM

## 2022-02-01 NOTE — Unmapped (Signed)
I saw the patient together with the Resident for the key portions of the exam. I discussed the findings, assessment and plan with the Resident and agree with the findings and plan as documented in the Resident's note.

## 2022-02-05 ENCOUNTER — Institutional Professional Consult (permissible substitution): Admit: 2022-02-05 | Discharge: 2022-02-05 | Payer: MEDICARE

## 2022-02-05 DIAGNOSIS — F251 Schizoaffective disorder, depressive type: Principal | ICD-10-CM

## 2022-02-05 MED ADMIN — fluPHENAZine decanoate (PROLIXIN) injection 50 mg: 50 mg | INTRAMUSCULAR | @ 16:00:00 | Stop: 2023-01-31

## 2022-02-05 NOTE — Unmapped (Signed)
Administered long-acting injectable today. See MAR for additional documentation. Saylah Ketner, CMA

## 2022-02-07 ENCOUNTER — Ambulatory Visit: Admit: 2022-02-07 | Discharge: 2022-02-08 | Payer: MEDICARE

## 2022-02-07 DIAGNOSIS — M25511 Pain in right shoulder: Principal | ICD-10-CM

## 2022-02-07 DIAGNOSIS — M79601 Pain in right arm: Principal | ICD-10-CM

## 2022-02-07 MED ORDER — FUROSEMIDE 20 MG TABLET
ORAL_TABLET | Freq: Every day | ORAL | 11 refills | 30 days | Status: CP
Start: 2022-02-07 — End: 2023-02-07

## 2022-02-07 MED ORDER — POLYETHYLENE GLYCOL 3350 17 GRAM ORAL POWDER PACKET
PACK | Freq: Every day | ORAL | 11 refills | 30 days | Status: CP
Start: 2022-02-07 — End: ?

## 2022-02-07 NOTE — Unmapped (Signed)
Will make an appointment for shoulder doctor to see you    Will get an xray today    May use gabapentin 1 tablet two or three times a day for pain    May use the cyclobenzaprine for muscle tightness once a day as needed    May use miralex for constipation

## 2022-02-07 NOTE — Unmapped (Addendum)
Chief Complaint   Patient presents with   ??? Arm Pain     Right arm pain    ??? Shoulder Pain     Right shoulder    ??? 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     SUBJECTIVE:    Tammy Braun is a 81 y.o. female that presents to clinic today regarding the following issues:    Pain in right arm  Takes muscle realxant  OUT OF SOME MEDS  Larey Seat 4 months ago  Now weak  Hard to lift  Pain on top r shoulder   Wants a wheelchair too  Mobility limitied         reports that she has been smoking cigarettes. She has a 57.00 pack-year smoking history. She has never used smokeless tobacco.    The following portions of the patient's history were reviewed and updated as appropriate: allergies, current medications, past family history, past medical history, past social history, past surgical history and problem list.    Review of systems for 10 organ systems conducted and no significant positives      OBJECTIVE:    Vital signs have been reviewed:   Vitals:    02/07/22 1348   BP: 129/69   Pulse: 78   Temp: 36.5 ??C (97.7 ??F)     Vs noted  Arms with severe rom r> l. Both bad  Difficult getting out of chair

## 2022-02-08 DIAGNOSIS — M79601 Pain in right arm: Principal | ICD-10-CM

## 2022-02-08 NOTE — Unmapped (Signed)
Results Request     Patient Name: Tammy Braun. [name]   Caller: Self (Patient)  Contact Method: Telephone Call: Time- Any Time [phone]   Type of Results: Imaging    2/8 X-ray results. Pt would like call to explain results.

## 2022-02-08 NOTE — Unmapped (Signed)
Patient called again to check on x-ray results

## 2022-02-12 ENCOUNTER — Ambulatory Visit: Admit: 2022-02-12 | Payer: MEDICARE | Attending: Family Medicine | Primary: Family Medicine

## 2022-02-12 NOTE — Unmapped (Unsigned)
[facility]       Patient Name:Tammy Braun  MRN: [id number]  DOB: [birth year]  Age: 81 y.o.   Date: 02/12/2022    SUBJECTIVE     Chief complaint: No chief complaint on file.      History of present Illness:  Tammy Braun is a 81 y.o. female with  has a past medical history of Anxiety, Arthritis, Breast cyst, Cancer (CMS-HCC), Depression, psychotic (CMS-HCC), Diabetes mellitus (CMS-HCC), Hernia, History of transfusion, Hypertension, and Pulmonary emphysema (CMS-HCC) (05/08/2015). who comes in today in with complaint of No chief complaint on file.  .    Shoulder Pain  ***    OBJECTIVE   There were no vitals taken for this visit.  General/Constitutional: NAD    Comprehensive {Left/Right:50524} Shoulder Exam:  Inspection:   Ecchymosis: {testting1:34150::not done}   Scapular winging: {testting1:34150::negative}   Posture: {sitting posture:51595::normal}  TTP:    {Shoulder joint tender:15822}  ROM:    F flexion: {NUMBERS; 1-170 BY 10 (NORMAL):140021::Normal}   Abduction: {NUMBERS; 1-170 BY 10 (NORMAL):140021::Normal}   ER: {NUMBERS; 0-90 BY 10 (NORMAL):140026::Normal}   IR: {Rom shoulder internal rotation:140028::Normal}  Strength:   SSP: {Numbers; 0-5:140013::5}/5   ER: {Numbers; 0-5:140013::5}/5  IR: {Numbers; 0-5:140013::5}/5  Special Tests:   Painful Arc: {testting1:34150::negative}   Hawkin's: {testting1:34150::negative}   Neer's: {testting1:34150::negative}  Empty can: {testting1:34150::negative}  Subscap lift off: {testting1:34150::negative}  External Lag: {testting1:34150::negative}  Internal Lag: {testting1:34150::negative}  Drop arm: {testting1:34150::negative}  O'brien's: {testting1:34150::negative}  AC crossover: {testting1:34150::negative}  Resisted AC cross adduction: {testting1:34150::negative}  Apprehension: {testting1:34150::negative}  Jobe Relocation: {testting1:34150::not done}  Posterior Apprehension: {testting1:34150::not done}  Load and Shift: Anterior {testting1:34150::not done}, Posterior {testting1:34150::not done}    Diagnostic Imaging     Right Shoulder x-rays: IMPRESSION:  Subchondral sclerosis and cystic changes of the greater tuberosity, suggestive of rotator cuff tendinopathy, similar to prior.    I personally reviewed images and agree with interpretation.    {Right Left Bilateral:36550} Shoulder MRI: {N/A Comment:[phone]::N/A}      ASSESSMENT AND PLAN     Pt is a 81 y.o. year old female with:    {No diagnosis found. (Refresh or delete this SmartLink)}    ***    1. Home exercise program: {yes and no default yes:53961::YES}  2. PT referral: {yes/no:21137::no}  3. OTC meds: {Blank single:49760::no}  4. Prescription medication: {Blank single:49760::no}  5. Brace/Orthotic: {yes/no:21137::no}  6. Injection: {yes/no:21137::no}  7. Imaging: {yes/no:21137::no}  8. Labs: {yes/no:21137::no}  9. Specialty referral: {yes/no:21137::no}  10. OA supplement handout: {yes and no default yes:53961::no}      - No follow-ups on file.    Corbin Ade, MD, CAQSM  Primary Care Sports Medicine  [facility]

## 2022-02-13 ENCOUNTER — Ambulatory Visit: Admit: 2022-02-13 | Discharge: 2022-02-14 | Payer: MEDICARE

## 2022-02-13 NOTE — Unmapped (Signed)
Postoperative Note:     Date of Surgery: 04/08/18  Procedure Performed:  L rTSA with biceps tenodesis.    Interim History:    Tammy Braun returns today with her daughter.  She reports right shoulder pain has been worse in the last few days.  She has trouble moving her arm.  She is not interested in surgery.  She cannot take NSAIDs.  She has not had an injection recently.  She is interested in this today.  She reports improvement in her left shoulder.    Physical Examination:    General Appearance ?? well-nourished and no acute distress   Mood and Affect ?? alert, cooperative and pleasant   Cardiovascular ?? well-perfused distally   Sensation ?? Sensation intact to light touch distally   MUSCULOSKELETAL        Right shoulder ??  seated paralytic with inability to forward elevate past 20 degrees, abduct past 30 degrees.  Pain with passive range of motion however can get to 110 degrees forward elevation, 90 abduction.  Intact grip strength         Previously obtained but independently interpreted right shoulder x-ray series demonstrates glenohumeral arthritis without significant evidence of proximal humeral migration however there is AC arthrosis.    Assessment:   s/p left reverse total shoulder arthroplasty and biceps tenodesis on 04/08/2018, now with right shoulder pain    Plan:  We had a good discussion with Jessia today.  Reviewed her treatment options including no further care, injection, therapy, surgery.  She is adamant she wishes to avoid surgery.  Consequently we discussed an injection which she is willing to try.  Additionally we will also get her a prescription for physical therapy to begin working on strength and range of motion of her right shoulder after injection.  We discussed with her primary care provider initiation of NSAIDs but given her consistent Goody powder use we were cautioned against any prescription for this.  We discussed we will see her on an as-needed basis.  We could consider reverse total shoulder arthroplasty if she changes her mind.  All of her questions were answered.    A steroid injection was performed at right shoulder using 1% plain Lidocaine and 40 mg of Kenalog. This was well tolerated.    Prescriptions today: Physical therapy  Follow-up: As needed  X-rays at next visit:  None

## 2022-02-16 DIAGNOSIS — R059 Cough: Principal | ICD-10-CM

## 2022-02-16 DIAGNOSIS — J189 Pneumonia, unspecified organism: Principal | ICD-10-CM

## 2022-02-16 MED ORDER — ALBUTEROL SULFATE HFA 90 MCG/ACTUATION AEROSOL INHALER
RESPIRATORY_TRACT | 2 refills | 0 days | Status: CP | PRN
Start: 2022-02-16 — End: ?

## 2022-02-16 NOTE — Unmapped (Signed)
I performed a history and physical examination of the patient and   discussed the patient's management with the Resident. I reviewed   the Resident's note and agree with the documented findings and plan   of care.

## 2022-02-16 NOTE — Unmapped (Signed)
Addended by: Rudell Cobb D on: 02/16/2022 01:59 PM     Modules accepted: Orders

## 2022-02-16 NOTE — Unmapped (Signed)
Patient Name: Tammy Braun. [name]   Caller: Self (Patient)  Have you contacted your pharmacy? no      Last Visit: 02/07/2022       Medication Name: Albuterol HFA   Dosage:As Directed   Route: Inhale 2 Puffds By mouth Every 4 Hours as needed   Frequency: As Needed (PRN)  Day Supply Requested: 30  Pharmacy (Name & Address): [facility]   [address] [place], [place] [address]   Pharmacy Phone Number: [phone]

## 2022-02-21 NOTE — Unmapped (Signed)
From placed in the provider's box       [address]

## 2022-03-06 DIAGNOSIS — G44229 Chronic tension-type headache, not intractable: Principal | ICD-10-CM

## 2022-03-06 MED ORDER — CYCLOBENZAPRINE 5 MG TABLET
ORAL_TABLET | Freq: Every evening | ORAL | 5 refills | 30 days | Status: CP
Start: 2022-03-06 — End: ?

## 2022-03-08 ENCOUNTER — Institutional Professional Consult (permissible substitution): Admit: 2022-03-08 | Discharge: 2022-03-09 | Payer: MEDICARE

## 2022-03-08 ENCOUNTER — Ambulatory Visit: Admit: 2022-03-08 | Discharge: 2022-03-09 | Payer: MEDICARE

## 2022-03-08 DIAGNOSIS — J438 Other emphysema: Principal | ICD-10-CM

## 2022-03-08 DIAGNOSIS — G603 Idiopathic progressive neuropathy: Principal | ICD-10-CM

## 2022-03-08 DIAGNOSIS — R300 Dysuria: Principal | ICD-10-CM

## 2022-03-08 DIAGNOSIS — M4802 Spinal stenosis, cervical region: Principal | ICD-10-CM

## 2022-03-08 DIAGNOSIS — N182 Chronic kidney disease, stage 2 (mild): Principal | ICD-10-CM

## 2022-03-08 DIAGNOSIS — F251 Schizoaffective disorder, depressive type: Principal | ICD-10-CM

## 2022-03-08 DIAGNOSIS — C670 Malignant neoplasm of trigone of bladder: Principal | ICD-10-CM

## 2022-03-08 DIAGNOSIS — F17200 Nicotine dependence, unspecified, uncomplicated: Principal | ICD-10-CM

## 2022-03-08 MED ORDER — SULFAMETHOXAZOLE 800 MG-TRIMETHOPRIM 160 MG TABLET
ORAL_TABLET | Freq: Two times a day (BID) | ORAL | 0 refills | 3 days | Status: CP
Start: 2022-03-08 — End: 2022-03-11

## 2022-03-08 MED ORDER — CANDESARTAN 8 MG TABLET
ORAL_TABLET | Freq: Every day | ORAL | 11 refills | 60 days | Status: CP
Start: 2022-03-08 — End: 2022-04-07

## 2022-03-08 MED ADMIN — fluPHENAZine decanoate (PROLIXIN) injection 50 mg: 50 mg | INTRAMUSCULAR | @ 16:00:00 | Stop: 2023-01-31

## 2022-03-08 NOTE — Unmapped (Signed)
Pt in for Fluphenazine Decanoate 2 ML injection  Given: right deltoid, via 1 inch needle. Pt tolerated injection well

## 2022-03-08 NOTE — Unmapped (Signed)
Chief Complaint   Patient presents with   ??? 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)       Nervous and Auditory    Peripheral neuropathy       Genitourinary    Malignant neoplasm of bladder (CMS-HCC)    Chronic renal disease, stage 2, mildly decreased glomerular filtration rate (GFR) between 60-89 mL/min/1.73 square meter       Other    Tobacco use disorder (Chronic)    Schizoaffective disorder (CMS-HCC)    Spinal stenosis of cervical region   Other Visit Diagnoses     Dysuria    -  Primary    Relevant Medications    sulfamethoxazole-trimethoprim (BACTRIM DS) 800-160 mg per tablet        Short course antibiotic likely uti  Stop amlodipine  Handicap sticker  Labs next visit  F/U 6 weeks  Start ARB but at low dose  Updated problem list annotations  Database review and update  Med review and update  Lab review and update  Counseling provided and patient education    40 minutes with patient- 1/2 in face to face counseling on chronic health conditions and copd and bp and uti    SUBJECTIVE:    Tammy Braun is a 81 y.o. female that presents to clinic today regarding the following issues:    Follow up  Seen last month  Recently saw cardiology put on candesartan but patient she is not taking  Some SOB when walking  Amlodipine causes  Some bad urine spell  Needs handicap sticker  Saw ortho for shoulder- shot helped    81 y.o.??female??with parastomal pain status post neoadjuvant chemotherapy and radical cystectomy with Ileal Conduit??on 10/05/2011 for T2N0M0 bladder cancer.  ??  PMH Chronic issues, including:??COPD,??Neuropathy,??Chronic pain,??HTN,??DJD neck, hip,??cervical stenosis,??cervical radiculopathy,??Chronic renal disease,??Depression??,??Onychomycosis,??Emphysema,??Ca Trigone Bladder,??Cervical Myofascial pain with chronic tension headaches??with medication overuse headaches,??left reverse total shoulder arthroplasty and biceps tenodesis on 04/08/2018, schizoaffective d/o     reports that she has been smoking cigarettes. She has a 57.00 pack-year smoking history. She has never used smokeless tobacco.    The following portions of the patient's history were reviewed and updated as appropriate: allergies, current medications, past family history, past medical history, past social history, past surgical history and problem list.    Review of systems for 10 organ systems conducted and no significant positives      OBJECTIVE:    Vital signs have been reviewed:   Vitals:    03/08/22 1253   BP: 131/75   Pulse: 86   Temp: 36.3 ??C (97.4 ??F)

## 2022-03-15 NOTE — Unmapped (Signed)
Needs face to face notes from provider on form recently faxed back

## 2022-03-15 NOTE — Unmapped (Signed)
Clinic note March 9th faxed back to # [phone]      [facility]

## 2022-03-15 NOTE — Unmapped (Signed)
Form faxed back to # [phone]      [facility]

## 2022-03-16 NOTE — Unmapped (Signed)
03-16-22 @ 8:24 am  -  Alfrieda with [employer] is requesting face-to-face appointment notes regarding the patient's need for a wheelchair. Alfrieda stated that the last fax did not include this information. Please fax to:  [phone]  Attention:  Alfrieda.  For questions, please call:  [phone] Natale Milch). Thank you.

## 2022-03-16 NOTE — Unmapped (Signed)
Hi,     Please see the request  for a face to face  note needed for this patient to get her wheel chair .     Thanks ,[employer]

## 2022-03-21 NOTE — Unmapped (Signed)
[facility] Administered Medication Refill Coordination Note      NAME:Tammy Braun DOB: 04-26-1941      Medication: fluphenazine    Day Supply: 28 days      SHIPPING      Next delivery from [facility] [phone]) to [facility] for [name] is scheduled for 03/29.    Clinic contact: [name]    Patient's next nurse visit for administration: 04/10.    We will follow up with clinic monthly for standard refill processing and delivery.      Korvin Valentine Samella Parr  Specialty Pharmacy Technician

## 2022-03-28 MED FILL — FLUPHENAZINE DECANOATE 25 MG/ML INJECTION SOLUTION: INTRAMUSCULAR | 60 days supply | Qty: 5 | Fill #1

## 2022-03-29 ENCOUNTER — Ambulatory Visit: Admit: 2022-03-29 | Payer: MEDICARE | Attending: Family Medicine | Primary: Family Medicine

## 2022-03-29 NOTE — Unmapped (Signed)
03-19-22 @ 4:40 pm  -  Alfrieda with [employer] is requesting for the patient's Office Visit Notes from 02-07-22 to be amended to include:  Face-To-Face notes to allow the patient to get a wheelchair.     Fax to:  [phone]  Attn:  Alfrieda  Office Phone:  [phone]  Thank you.

## 2022-03-29 NOTE — Unmapped (Unsigned)
[facility]       Patient Name:Tammy Braun  MRN: [id number]  DOB: 01/25/1941  Age: 81 y.o.   Date: 03/29/2022    SUBJECTIVE     Chief complaint: No chief complaint on file.      History of present Illness:  Tammy Braun is a 81 y.o. female with  has a past medical history of Anxiety, Arthritis, Breast cyst, Cancer (CMS-HCC), Depression, psychotic (CMS-HCC), Diabetes mellitus (CMS-HCC), Hernia, History of transfusion, Hypertension, and Pulmonary emphysema (CMS-HCC) (05/08/2015). who comes in today in with complaint of No chief complaint on file.  .    Shoulder Pain  ***    OBJECTIVE   There were no vitals taken for this visit.  General/Constitutional: NAD    Comprehensive {Left/Right:50524} Shoulder Exam:  Inspection:   Ecchymosis: {testting1:34150::not done}   Scapular winging: {testting1:34150::negative}   Posture: {sitting posture:51595::normal}  TTP:    {Shoulder joint tender:15822}  ROM:    F flexion: {NUMBERS; 1-170 BY 10 (NORMAL):140021::Normal}   Abduction: {NUMBERS; 1-170 BY 10 (NORMAL):140021::Normal}   ER: {NUMBERS; 0-90 BY 10 (NORMAL):140026::Normal}   IR: {Rom shoulder internal rotation:140028::Normal}  Strength:   SSP: {Numbers; 0-5:140013::5}/5   ER: {Numbers; 0-5:140013::5}/5  IR: {Numbers; 0-5:140013::5}/5  Special Tests:   Painful Arc: {testting1:34150::negative}   Hawkin's: {testting1:34150::negative}   Neer's: {testting1:34150::negative}  Empty can: {testting1:34150::negative}  Subscap lift off: {testting1:34150::negative}  External Lag: {testting1:34150::negative}  Internal Lag: {testting1:34150::negative}  Drop arm: {testting1:34150::negative}  O'brien's: {testting1:34150::negative}  AC crossover: {testting1:34150::negative}  Resisted AC cross adduction: {testting1:34150::negative}  Apprehension: {testting1:34150::negative}  Jobe Relocation: {testting1:34150::not done}  Posterior Apprehension: {testting1:34150::not done}  Load and Shift: Anterior {testting1:34150::not done}, Posterior {testting1:34150::not done}    Diagnostic Imaging     Right Shoulder x-rays:   FINDINGS:    No acute fracture or traumatic dislocation. Osteophytosis with narrowing of the acromioclavicular joint space. The glenohumeral joint space is preserved. Subchondral sclerosis and cystic changes of the greater tuberosity, suggestive of rotator cuff tendinopathy. Soft tissues are unremarkable.      I personally reviewed images and agree with interpretation.    {Right Left Bilateral:36550} Shoulder MRI: {N/A Comment:[phone]::N/A}      ASSESSMENT AND PLAN     Pt is a 81 y.o. year old female with:    {No diagnosis found. (Refresh or delete this SmartLink)}    ***    1. Home exercise program: {yes and no default yes:53961::YES}  2. PT referral: {yes/no:21137::no}  3. OTC meds: {Blank single:49760::no}  4. Prescription medication: {Blank single:49760::no}  5. Brace/Orthotic: {yes/no:21137::no}  6. Injection: {yes/no:21137::no}  7. Imaging: {yes/no:21137::no}  8. Labs: {yes/no:21137::no}  9. Specialty referral: {yes/no:21137::no}  10. OA supplement handout: {yes and no default yes:53961::no}      - No follow-ups on file.    Corbin Ade, MD, CAQSM  Primary Care Sports Medicine  [facility]

## 2022-04-03 NOTE — Unmapped (Signed)
Please message below requesting a face to face note       Thanks, Riley Lam

## 2022-04-03 NOTE — Unmapped (Signed)
Clinic note Feb. 8th 2023 was faxed to Attn: Alfried at [phone] per request .        Tammy Braun

## 2022-04-04 NOTE — Unmapped (Signed)
Patient called in but hung up. Called patient back. Patient stated  I'm taking a high of a blood pressure pill. I don't think it's strong enough. Asked patient if she checked her blood pressure. Patient stated  no I just can tell. Advised patient that I would send pcp this message. Patient is requesting that pcp only call her back.

## 2022-04-04 NOTE — Unmapped (Signed)
Called patient. Left message to call back regarding concern.

## 2022-04-04 NOTE — Unmapped (Signed)
Patient Advice Request     Patient Name: Tammy Braun. Tammy Braun  Caller: Self (Patient)  Contact Method: Telephone Call: Time- Any Time Ph. [phone]  Reason for Call: Pt. Is requesting a telephone call from her provider about concerns of her BLOOD PRESSURE Medication. Pt is unsure of the name of the medication. Pt. Refused appt.   Previously Discussed: no  Appointment Offered: Declined    Ph. [phone]

## 2022-04-05 ENCOUNTER — Ambulatory Visit
Admit: 2022-04-05 | Discharge: 2022-04-06 | Payer: MEDICARE | Attending: Student in an Organized Health Care Education/Training Program | Primary: Student in an Organized Health Care Education/Training Program

## 2022-04-05 DIAGNOSIS — H2512 Age-related nuclear cataract, left eye: Principal | ICD-10-CM

## 2022-04-05 DIAGNOSIS — H2511 Age-related nuclear cataract, right eye: Principal | ICD-10-CM

## 2022-04-05 DIAGNOSIS — H25813 Combined forms of age-related cataract, bilateral: Principal | ICD-10-CM

## 2022-04-05 NOTE — Unmapped (Signed)
81 y.o. female pt of Dr. Clifton Custard who presents for cataract evaluation.    Assessment/Plan:    #. Cataract OU  - visually significant cataract OU, that interferes with quality of life and daily activities  - BCVA 20/40 OD, 20/60 OS  - PXF (-), phacodensis (-), good dilation, Flomax (-), Trauma (-), hx surgery/refractive procedure (-)  - recommend CE/PCIOL OS first then OD. Risks including but not limited to loss of vision and loss of the eye, benefits, and alternatives to surgery explained and discussed. Patient voiced understanding and wishes to proceed.  - plan for DIB00 +22.0 lens OS, target distance  - defers toric/femto, understand will need glasses for best vision at all distances given astigmatism OD  - Schedule for CE/IOL OS 6/6 and OD 6/20        ----------below followed by Dr. 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 Explained risks of developing visual loss from retinopathy in future.  All questions were answered.  Stressed compliance with meds/blood sugar control and follow-up w/ PCP as scheduled.  -Patient was advised to return to clinic in 1 year for follow-up or sooner if changes in vision develop.    #) Hypertensive retinopathy both eyes  -Grade: 1   -Condition: stable  -Reviewed findings and diagnosis in detail with pt.  All questions were answered.  -Discussed natural history and need for monitoring vs. treatment.  -Stressed compliance w/ meds & BP control and follow-up w/ PCP as scheduled.  -RTC 1 year     #) Macular drusen: both eyes  -Findings and dx were reviewed in detail with the patient including the natural history of drusen and the associated increased risk of developing ARMD.  -We discussed the appropriateness of starting AREDS 2 vitamins to slow down the progression of ARMD.  Common side effects of vitamins were discussed.  All questions were answered.  -AREDS 2 vitamins are not recommended in this patient due to the mild nature of the drusen.  -Monitoring w/ an Amsler grid is not indicated due to the mild nature of the drusen.  -RTC 1 year for dilated exam or sooner if pt. notes any vision changes.      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  Discussed natural h/o of myopia/astigmatism.  -The options including observation vs. corrective lenses were discussed.  -All questions were answered.  - Observation recommended..  - The patient was not given a prescription for new glasses.    I saw and evaluated the patient, participating in the key portions of the service.  I reviewed the resident???s note.  I agree with the resident???s findings and plan including interpretation of images.

## 2022-04-05 NOTE — Unmapped (Signed)
Addended by: Pearlie Oyster on: 04/05/2022 12:19 PM     Modules accepted: Orders

## 2022-04-06 NOTE — Unmapped (Signed)
Daughter wants a call back [phone]

## 2022-04-06 NOTE — Unmapped (Signed)
Called patient. Left message to call back and schedule an appointment per Dr. Gwenlyn Fudge.

## 2022-04-06 NOTE — Unmapped (Signed)
Spoke with Daughter [name]. Daughter asked if Mom would talk the who tablet. Advised her that pcp recommended patient to be seen or video visit. Daughter said she preferred to come in . Appointment scheduled 04/11/22.

## 2022-04-09 ENCOUNTER — Institutional Professional Consult (permissible substitution): Admit: 2022-04-09 | Discharge: 2022-04-10 | Payer: MEDICARE

## 2022-04-09 MED ADMIN — fluPHENAZine decanoate (PROLIXIN) injection 50 mg: 50 mg | INTRAMUSCULAR | @ 15:00:00 | Stop: 2023-01-31

## 2022-04-09 NOTE — Unmapped (Signed)
Administered long-acting injectable today. See MAR for additional documentation. Allante Whitmire, CMA

## 2022-04-11 ENCOUNTER — Ambulatory Visit: Admit: 2022-04-11 | Discharge: 2022-04-12 | Payer: MEDICARE

## 2022-04-11 MED ORDER — CANDESARTAN 8 MG TABLET
ORAL_TABLET | Freq: Every day | ORAL | 11 refills | 60 days | Status: CP
Start: 2022-04-11 — End: 2022-05-11

## 2022-04-11 NOTE — Unmapped (Signed)
Chief Complaint   Patient presents with   ??? 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       Genitourinary    Malignant neoplasm of bladder (CMS-HCC)    Chronic renal disease, stage 2, mildly decreased glomerular filtration rate (GFR) between 60-89 mL/min/1.73 square meter       Other    Tobacco use disorder (Chronic)    Schizoaffective disorder (CMS-HCC)    Chronic tension-type headache, not intractable       40 minutes with patient- 1/2 in face to face counseling on chronic health conditions and copd and bp and pain and djd  Labs next visit  Reassured BP and A1C- 6.6    Updated problem list annotations  Database review and update  Med review and update  Lab review and update  Counseling provided and patient education    SUBJECTIVE:    Tammy Braun is a 81 y.o. female that presents to clinic today regarding the following issues:    Pain in legs  DJD knees, shoulder, neck, and spine     Right knee-   Severe patellofemoral joint space narrowing, subchondral sclerosis and marginal osteophytosis, progressed since prior. Minimal narrowing of the medial compartment joint space. No fracture, dislocation or significant joint effusion. Multiple loose bodies.   ??   Left knee-   Mild medial and patellofemoral compartment joint space narrowing, similar to previous. No fracture, dislocation or joint effusion.     Last visit took Short course antibiotic likely uti- improved  Stopped amlodipine- BP remains good  Handicap sticker- all good  Started ARB but at low dose      ??  81 y.o.??female??with parastomal pain status post neoadjuvant chemotherapy and radical cystectomy with Ileal Conduit??on 10/05/2011 for T2N0M0 bladder cancer.  ??  PMH Chronic issues, including:??COPD,??Neuropathy,??Chronic pain,??HTN,??DJD neck, hip,??cervical stenosis,??cervical radiculopathy,??Chronic renal disease,??Depression??,??Onychomycosis,??Emphysema,??Ca Trigone Bladder,??Cervical Myofascial pain with chronic tension headaches??with medication overuse headaches,??left reverse total shoulder arthroplasty      reports that she has been smoking cigarettes. She has a 57.00 pack-year smoking history. She has never used smokeless tobacco.    The following portions of the patient's history were reviewed and updated as appropriate: allergies, current medications, past family history, past medical history, past social history, past surgical history and problem list.    Review of systems for 10 organ systems conducted and no significant positives      OBJECTIVE:    Vital signs have been reviewed:   Vitals:    04/11/22 1020   BP: 132/72   Pulse:    Temp:

## 2022-04-13 ENCOUNTER — Ambulatory Visit
Admit: 2022-04-13 | Discharge: 2022-04-14 | Payer: MEDICARE | Attending: Student in an Organized Health Care Education/Training Program | Primary: Student in an Organized Health Care Education/Training Program

## 2022-04-13 NOTE — Unmapped (Signed)
Order Request     Patient Name: Lorae Roig. [name]   Caller:  [facility]  Contact Method: Telephone Call: Time- Any Time [phone]   Type of Order: Equipment   Details about the Order: Alfreda said that the patient is needing a wheel chair and they've received the order, but the order was denied.  [facility] needs for the provider to redo the face-to-face notes and fax them back over to them.  Fax. [phone]

## 2022-04-13 NOTE — Unmapped (Addendum)
Thank you for coming to your appointment today     1) We will call you to schedule a follow up appointment in the clinic   2) Blima Singer will come talk to you about starting a Friday injection schedule   3) Continue medications as prescribed. We are not making any medication changes today.   4) I will look into therapy options moving forward, however please check available therapists on [email or web address]     Follow-up instructions:  -- Please continue taking your medications as prescribed for your mental health.   -- Do not make changes to your medications, including taking more or less than prescribed, unless under the supervision of your physician. Be aware that some medications may make you feel worse if abruptly stopped  -- Please refrain from using illicit substances, as these can affect your mood and could cause anxiety or other concerning symptoms.   -- Seek further medical care for any increase in symptoms or new symptoms such as thoughts of wanting to hurt yourself or hurt others.     Contact info:  Life-threatening emergencies: call 911 or go to the nearest ER for medical or psychiatric attention.     Issues that need urgent attention but are not life threatening: call the clinic at [phone].    Non-urgent routine concerns, questions, and refill requests: please call the clinic for assistance, or you may call my voicemail at [phone], and I will reply within 2 business days.     Regarding appointments:  - If you need to cancel your appointment, we ask that you call [phone] at least 24 hours before your scheduled appointment.  - If for any reason you arrive 15 minutes later than your scheduled appointment time, you may not be seen and your visit may be rescheduled.  - Please remember that we will not automatically reschedule missed appointments.  - If you miss two (2) appointments without letting us know at least 24 hours in advance, you will likely be referred to a provider in your community.  - We will do our best to be on time. Sometimes an emergency will arise that might cause your clinician to be late. We will try to inform you of this when you check in for your appointment. If you wait more than 15 minutes past your appointment time without such notice, please speak with the front desk staff.    In the event of bad weather, the clinic staff will attempt to contact you, should your appointment need to be rescheduled. Additionally, you can call the Patient Weather Line [phone] for system-wide clinic status    For more information and reminders regarding clinic policies (these were provided when you were admitted to the clinic), please ask the front desk.

## 2022-04-13 NOTE — Unmapped (Signed)
[facility]  Psychiatry   Established Patient E&M Service - Outpatient       Assessment:    Dolly Alkhatib presents for follow-up evaluation with her daughter. Today 04/13/22, patient endorses continued stability on regimen of Prolixin 50 mg LAI q30 days. Patient has tolerated this regimen of Prolixin well. She notes some emergence of auditory hallucinations in the days leading up to her injection, however this is chronic and she is able to ignore them. Of note, daughter would like Ms. Kershaw to have a therapist to speak with regarding some of her depression and anxiety symptoms. Ms. Mcneff notes that she has been thinking about the past more frequently as she gets older and it would be helpful to have someone to process this with. Advised that I would follow up with our [facility] therapy referral person to get this process started. Advised that the front desk would call to schedule follow up appointment once templates are open for the new year. Ms. Hofmeister and daughter in room verbalize understanding and are agreeable with plan.       Identifying Information:  Gregory Dowe is a 81 y.o. female with a history of schizoaffective disorder. First presented to [facility] September 2021. She has been stable on prolixin 50 mg q30 days for the past 30 years. Per PCP, patient has had worsening of depressive symptoms, though patient did not endorse. Reports auditory hallucinations and denies any CAH. Reports some anxiety related to impatience waiting on others, but finds this manageable. Patient is not interested in any new medications, but could consider increasing the frequency of prolixin administration if needed. March 2022, augmenting with mirtazapine for low mood and sleep. Could also consider augmenting with SRNI or wellbutrin for depression.     Risk Assessment:  A full suicide and violence risk assessment was performed as part of this patient's initial evaluation with [facility] outpatient psychiatry.  There is no new acute risk for suicide or violence at this time. The patient was educated about relevant modifiable risk factors including following recommendations for treatment of psychiatric illness and abstaining from substance abuse.   While future psychiatric events cannot be accurately predicted, the patient does not currently require acute inpatient psychiatric care and does not currently meet [facility] involuntary commitment criteria.      Plan:    Problem: Schizoaffective disorder  Status of problem: chronic and stable  Interventions:     - Continue Prolixin 50 mg IM q30 days; could consider increasing frequency of injections in the future; will plan to change location of injection to [facility]   - Discontinue Cymbalta 20 mg daily as patient has not been taking consistently   - Pt is interested in therapy. Will reach out to therapy referral at [facility].     #High Risk Medication Monitoring   Lab drawn 11/03/21: LDL 107  Lab drawn 05/17/21: 6.5      Psychotherapy provided:  No billable psychotherapy service provided.    Patient has been given this writer's contact information as well as the [facility] Psychiatry urgent line number. The patient has been instructed to call 911 for emergencies.    Patient was seen and plan of care was discussed with the Attending MD,Dr. Dominic Pea, who agrees with the above statement and plan.      Subjective:    Interval History:   Patient reports that she has been doing alright. Notes that she still hears some voices in the days immediately prior  to her scheduled injection, however these are not bothersome and she is able to ignore them. Denies thoughts of harm to self or others. Patient is not currently taking the Cymbalta daily. Daughter notes that she has been using sporadically and advised discontinuation. Patient has been sleeping fine. Patient has a new a new great-grandson and that has been positive.     She is currently not hearing any voices. Patient notes that the injection is working fine. Reports some mild constipation but is using OTC medications to treat. Daughter would like patient to have someone to talk to in therapy given that patient has recently started talking more about her past. Advised that I would reach out to [facility] therapy referral about getting this process started. Advised that the front desk staff would reach out to schedule a follow up appointment once the templates open for the new year.     Objective:    Mental Status Exam:  Appearance:    Appears stated age, Well nourished and Well developed   Motor:   No abnormal movements   Speech/Language:    Normal rate, volume, tone, fluency and Impaired articulation   Mood:   alright   Affect:   Calm, Cooperative and Euthymic   Thought process and Associations:   Logical, linear, clear, coherent, goal directed   Abnormal/psychotic thought content:     Denies SI, HI, self harm, delusions, obsessions, paranoid ideation, or ideas of reference   Perceptual disturbances:     Endorses auditory hallucinations towards the end of the month before she is due for her next injection   Other:   Insight, judgment, and impulse control are fair         Visit was completed face to face.      Lovett Calender, MD  04/13/22

## 2022-04-18 NOTE — Unmapped (Signed)
I saw the patient together with the Resident for the key portions of the exam. I discussed the findings, assessment and plan with the Resident and agree with the findings and plan as documented in the Resident's note.

## 2022-04-23 NOTE — Unmapped (Signed)
HBi,     I spoke with her daughter and she is going to keep the appointment with you for May 24 th , do it all on the same visit unless you feel she should come in sooner .       Thanks, [employer]

## 2022-04-23 NOTE — Unmapped (Signed)
Hi Eunice,  Can you please assist Dr. Gwenlyn Fudge with this?  Thanks,   Aram Beecham

## 2022-05-10 ENCOUNTER — Ambulatory Visit
Admit: 2022-05-10 | Discharge: 2022-05-13 | Disposition: A | Discharge status: Home / Self Care | Payer: MEDICARE | Admitting: Family Medicine

## 2022-05-10 LAB — URINALYSIS WITH MICROSCOPY WITH CULTURE REFLEX
BILIRUBIN UA: NEGATIVE
GLUCOSE UA: NEGATIVE
KETONES UA: NEGATIVE
NITRITE UA: POSITIVE — AB
PH UA: 7 (ref 5.0–9.0)
PROTEIN UA: 50 — AB
RBC UA: 4 /HPF (ref <=4)
SPECIFIC GRAVITY UA: 1.011 (ref 1.003–1.030)
SQUAMOUS EPITHELIAL: 1 /HPF (ref 0–5)
UROBILINOGEN UA: <2
WBC UA: 6 /HPF — ABNORMAL HIGH (ref 0–5)

## 2022-05-10 LAB — CBC W/ AUTO DIFF
BASOPHILS ABSOLUTE COUNT: 0 10*9/L (ref 0.0–0.1)
BASOPHILS RELATIVE PERCENT: 0.3 %
EOSINOPHILS ABSOLUTE COUNT: 0.1 10*9/L (ref 0.0–0.5)
EOSINOPHILS RELATIVE PERCENT: 0.4 %
HEMATOCRIT: 42.3 % (ref 34.0–44.0)
HEMOGLOBIN: 13.4 g/dL (ref 11.3–14.9)
LYMPHOCYTES ABSOLUTE COUNT: 1.3 10*9/L (ref 1.1–3.6)
LYMPHOCYTES RELATIVE PERCENT: 7 %
MEAN CORPUSCULAR HEMOGLOBIN CONC: 31.8 g/dL — ABNORMAL LOW (ref 32.0–36.0)
MEAN CORPUSCULAR HEMOGLOBIN: 27.1 pg (ref 25.9–32.4)
MEAN CORPUSCULAR VOLUME: 85.3 fL (ref 77.6–95.7)
MEAN PLATELET VOLUME: 8.5 fL (ref 6.8–10.7)
MONOCYTES ABSOLUTE COUNT: 1.5 10*9/L — ABNORMAL HIGH (ref 0.3–0.8)
MONOCYTES RELATIVE PERCENT: 8.4 %
NEUTROPHILS ABSOLUTE COUNT: 15.3 10*9/L — ABNORMAL HIGH (ref 1.8–7.8)
NEUTROPHILS RELATIVE PERCENT: 83.9 %
PLATELET COUNT: 344 10*9/L (ref 150–450)
RED BLOOD CELL COUNT: 4.95 10*12/L (ref 3.95–5.13)
RED CELL DISTRIBUTION WIDTH: 17.6 % — ABNORMAL HIGH (ref 12.2–15.2)
WBC ADJUSTED: 18.2 10*9/L — ABNORMAL HIGH (ref 3.6–11.2)

## 2022-05-10 LAB — COMPREHENSIVE METABOLIC PANEL
ALBUMIN: 3.6 g/dL (ref 3.4–5.0)
ALKALINE PHOSPHATASE: 91 U/L (ref 46–116)
ALT (SGPT): <7 U/L — ABNORMAL LOW (ref 10–49)
ANION GAP: 9 mmol/L (ref 5–14)
AST (SGOT): 13 U/L (ref <=34)
BILIRUBIN TOTAL: 0.6 mg/dL (ref 0.3–1.2)
BLOOD UREA NITROGEN: 20 mg/dL (ref 9–23)
BUN / CREAT RATIO: 22
CALCIUM: 10.1 mg/dL (ref 8.7–10.4)
CHLORIDE: 100 mmol/L (ref 98–107)
CO2: 27 mmol/L (ref 20.0–31.0)
CREATININE: 0.92 mg/dL — ABNORMAL HIGH
EGFR CKD-EPI (2021) FEMALE: 63 mL/min/{1.73_m2} (ref >=60)
GLUCOSE RANDOM: 198 mg/dL — ABNORMAL HIGH (ref 70–179)
POTASSIUM: 4.8 mmol/L (ref 3.4–4.8)
PROTEIN TOTAL: 8.2 g/dL (ref 5.7–8.2)
SODIUM: 136 mmol/L (ref 135–145)

## 2022-05-10 LAB — LIPASE: LIPASE: 24 U/L (ref 12–53)

## 2022-05-10 MED ADMIN — cefTRIAXone (ROCEPHIN) 1 g in sodium chloride 0.9 % (NS) 100 mL IVPB-connector bag: 1 g | INTRAVENOUS | @ 22:00:00 | Stop: 2022-05-10

## 2022-05-10 MED ADMIN — iohexoL (OMNIPAQUE) 350 mg iodine/mL solution 100 mL: 100 mL | INTRAVENOUS | @ 23:00:00 | Stop: 2022-05-10

## 2022-05-10 NOTE — Unmapped (Signed)
Regarding: spitting up blood, stomach pain  ----- Message from Heath Lark sent at 05/10/2022  9:45 AM EDT -----  TX-spitting up blood, stomach pain

## 2022-05-10 NOTE — Unmapped (Signed)
PT here with abdominal pain and n/v.

## 2022-05-10 NOTE — Unmapped (Signed)
[facility]  Emergency Department Provider Note        ED Clinical Impression      Final diagnoses:   None         Impression, Medical Decision Making, Progress Notes and Critical Care      Impression, Differential Diagnosis and Plan of Care    Impression: Tammy Braun is a 81 year old female with a past medical history of T2N0M0 bladder cancer status post neoadjuvant chemotherapy and radical cystectomy with ileal conduit on 10/05/2011, COPD, hypertension, degenerative disc disease, neuropathy who presents in the setting of a 1 day history of mild, waxing/waning epigastric/right upper quadrant pain, improved with eating, which began this morning after patient woke up.  Patient denies any inciting events.  Patient endorses history of constipation and generally requires MiraLAX daily to pass bowel movements.  Patient does not currently feel constipated; last bowel movement was 2 days ago.  Patient denies any dysuria, urgency, frequency.  Patient is accompanied by daughter who is active in her care endorses that patient has not had any urinary symptoms.  Patient reports that her stools have been slightly lighter than previous stools and denies any increased darkening of stools. In addition, patient also reports 1 day history of cough productive of brownish sputum.  Patient denies any shortness of breath, worsening cough, baseline oxygen requirement or increased mucus production.  Patient does currently smoke 1 pack/day and has a 57 pack-year history of smoking.  Patient denies any alcohol, substance use. Vital signs stable on arrival. On physical exam, stomal site is well-appearing, pain, protuberant, and productive of yellow urine.  Last known CT scan June 2021 with no evidence of disease, stable parastomal hernia and mild right hydroureter.  Patient denies any pain, nausea/vomiting currently.  Patient is not on any blood thinners.      DDx/MDM: Briefly, 81 year old female with past medical history of bladder cancer status post radical cystectomy with ileal conduit presents in setting of new onset right upper quadrant abdominal pain with mild epigastric pain, waxing and waning, improved with eating without any inciting events.  Patient also endorses 1 day history of spitting up increasingly brown sputum. Of note, previous CT A/P scan June 2021 notable for large hiatal hernia.  CT chest at that time notable for dilated main pulmonary trunk otherwise unremarkable.  Differential includes incarcerated hernia versus normal postoperative changes versus UTI.  Will obtain CT A/P, CMP, lipase, CBC with differential, chest x-ray, EKG.    Additional Progress Notes    ED Course as of 05/10/22 2237   Thu May 10, 2022   1703 UA with large leukocyte esterase, positive nitrites, few bacteria.  Patient does have ileal conduit; unclear if true UTI.  Urinary culture pending.     1703 Lipase unremarkable.  CBC notable for white count 18.2.   1720 Ceftriaxone given x1.   1947 Creatinine(!): 0.92   2028 Urology consulted. Recommend IV antibiotics and management per Medicine.    2028 Paged MAO.    2132 Plan for admission per Family Medicine at [facility].   2150 Gave signout to admitting team at [facility].     Independent Interpretation of Studies    I have independently interpreted the following studies:  ??? EKG: Normal sinus rhythm with frequent PVCs.  Overall unchanged from prior.  ??? X-ray(s): Unchanged chronic hiatal hernia.  ??? CT A/P: Right hydroureteronephrosis with urothelial enhancement suggestive of pyelitis.    Discussion of Management with other Providers or Support Staff  I discussed the management of this patient with the:  ??? Admitting provider: Family Medicine @ [facility].  Plan for admission for IV antibiotic treatment of possible pyelonephritis.    Considerations Regarding Disposition/Escalation of Care and Critical Care    ??? Indications for observation/admission (or consideration of observation/admission) and/or appropriateness for outpatient management: Leukocytosis to 18.2 with CT notable for right hydroureternephritis, concerning for pyelitis versus pyelonephritis.  ??? Patient/Family/Caregiver Discussions: Daughter present at bedside who is active in patient's care.  Daughter reports that patient would be amenable to transfer to [facility] for further admission for IV antibiotics.  All questions and concerns have been addressed.    Portions of this record have been created using Scientist, clinical (histocompatibility and immunogenetics). Dictation errors have been sought, but may not have been identified and corrected.    See chart and resident provider documentation for details.    ____________________________________________         History        Reason for Visit  Emesis and Hemoptysis    HPI   Tammy Braun is a 81 y.o. female with past medical history of  T2N0M0 bladder cancer status post neoadjuvant chemotherapy and radical cystectomy with ileal conduit on 10/05/2011, COPD who presents in the setting of a 1 day history of right upper quadrant/lower epigastric pain, unprovoked/relieved with eating.  Patient was in her usual state of health this morning when she woke up and felt a sharp, waxing/waning abdominal pain.  Patient does have history of constipation and notes that her last bowel movement was 2 days ago and notably normal.  She denies any urinary symptoms; no frequency, dysuria, urgency.  Daughter notes that urine is smelled normal.  Patient additionally endorses 1 day history of spitting up brownish sputum.  Patient is currently smoking 1 pack/day and has a 57-year pack history.  Patient denies any shortness of breath, new cough, increased sputum production.  Patient does have history of COPD currently takes albuterol in the outpatient setting as needed.  Patient denies any fevers, weight loss, chills.    Outside Historian(s)  (EMS, Significant Other, Family, Parent, Caregiver, Friend, Patent examiner, etc.)    Daughter    External Records Reviewed  (Inpatient/Outpatient notes, Prior labs/imaging studies, Care Everywhere, PDMP, External ED notes, etc)    Past Medical History:   Diagnosis Date   ??? Anxiety    ??? Arthritis    ??? Breast cyst    ??? Cancer (CMS-HCC)     bladder   ??? Depression, psychotic (CMS-HCC)    ??? Diabetes mellitus (CMS-HCC)     in past   ??? Hernia    ??? History of transfusion    ??? Hypertension    ??? 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 Osteoarthrosis neck   ??? Senile nuclear sclerosis   ??? DM (diabetes mellitus), type 2 with neurological complications (CMS-HCC)   ??? HTN (hypertension)   ??? Malignant neoplasm of bladder (CMS-HCC)   ??? Tobacco use disorder   ??? Hernia, diaphragmatic   ??? Hyponatremia   ??? Thrombocytopenia (CMS-HCC)   ??? Hip arthritis mild   ??? Chronic renal disease, stage 2, mildly decreased glomerular filtration rate (GFR) between 60-89 mL/min/1.73 square meter   ??? Peripheral neuropathy   ??? Hypercholesterolemia refuses meds   ??? 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)   ??? Schizoaffective disorder (CMS-HCC)   ??? Onychomycosis of multiple toenails with type  2 diabetes mellitus and peripheral neuropathy (CMS-HCC)   ??? 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   ??? Risk for falls   ??? Right hip pain   ??? Tinea versicolor   ??? Disorder of left rotator cuff   ??? Spinal stenosis of cervical region   ??? Left shoulder pain   ??? Articular cartilage disorder of shoulder region, left   ??? Tinnitus   ??? Headache   ??? Bradycardia   ??? Skin breakdown   ??? Influenza vaccination declined   ??? Chronic tension-type headache, not intractable   ??? Osteoarthritis of cervical spine   ??? Arthralgia of both ankles   ??? Frequent PVCs   ??? Carotid arterial disease (CMS-HCC) < 40% bilaterally 2021   ??? Primary hypertension   ??? Nonrheumatic tricuspid valve regurgitation   ??? Pulmonary HTN (CMS-HCC)   ??? Pyelitis       Past Surgical History:   Procedure Laterality Date   ??? ABDOMINAL SURGERY     ??? BLADDER SURGERY ??? BREAST CYST EXCISION     ??? CHEMOTHERAPY  2012    bladder   ??? GALLBLADDER SURGERY      stone removal   ??? ILEOSTOMY  2012   ??? PR COLONOSCOPY FLX DX W/COLLJ SPEC WHEN PFRMD N/A 02/09/2015    Procedure: COLONOSCOPY, FLEXIBLE, PROXIMAL TO SPLENIC FLEXURE; DIAGNOSTIC, W/WO COLLECTION SPECIMEN BY BRUSH OR WASH;  Surgeon: Dewaine Conger, MD;  Location: [facility]  Service: Gastroenterology   ??? PR RECONSTR TOTAL SHOULDER IMPLANT Left 04/08/2018    Procedure: ARTHROPLASTY, GLENOHUMERAL JOINT; TOTAL SHOULDER(GLENOID & PROXIMAL HUMERAL REPLACEMENT(EG, TOTAL SHOULDER);  Surgeon: Tomasa Rand, MD;  Location: [facility];  Service: Ortho Sports Medicine   ??? PR SIGMOIDOSCOPY,BIOPSY N/A 03/11/2015    Procedure: SIGMOIDOSCOPY, FLEXIBLE; WITH BIOPSY, SINGLE OR MULTIPLE;  Surgeon: Wilburt Finlay, MD;  [facility]: [facility];  Service: Gastroenterology         Current Facility-Administered Medications:   ???  fluPHENAZine decanoate (PROLIXIN) injection 50 mg, 50 mg, Intramuscular, Q30 Days, Lovett Calender, MD, 50 mg at 04/09/22 1125    Current Outpatient Medications:   ???  albuterol HFA 90 mcg/actuation inhaler, Inhale 2 puffs every four (4) hours as needed for wheezing., Disp: 18 g, Rfl: 2  ???  blood sugar diagnostic (ACCU-CHEK AVIVA PLUS TEST STRP) Strp, by Other route daily at 10am., Disp: 50 strip, Rfl: 5  ???  blood-glucose meter kit, Use to check blood sugar four times daily or as directed by MD. Dx: Diabetes, E08.00 *DISPENSE ACCU-CHEK AVIVA PLUS BRAND*, Disp: 1 each, Rfl: 0  ???  candesartan (ATACAND) 8 MG tablet, Take 0.5 tablets (4 mg total) by mouth daily., Disp: 30 tablet, Rfl: 11  ???  cyclobenzaprine (FLEXERIL) 5 MG tablet, Take 1 tablet (5 mg total) by mouth nightly., Disp: 30 tablet, Rfl: 5  ???  DULoxetine (CYMBALTA) 20 MG capsule, Take 1 capsule (20 mg total) by mouth daily., Disp: 90 capsule, Rfl: 0  ???  fluPHENAZine decanoate (PROLIXIN) 25 mg/mL injection, Inject 2 mL (50 mg total) into the muscle every thirty (30) days., Disp: 5 mL, Rfl: 11  ???  furosemide (LASIX) 20 MG tablet, Take 1 tablet (20 mg total) by mouth daily., Disp: 30 tablet, Rfl: 11  ???  gabapentin (NEURONTIN) 100 MG capsule, 1-2 pills tid prn pain and numbness, Disp: 270 capsule, Rfl: 3  ???  inhalational spacing device (E-Z SPACER) Spcr, 1 each by Miscellaneous  route every four (4) hours as needed., Disp: 1 each, Rfl: 0  ???  lancets (ACCU-CHEK SOFTCLIX LANCETS) Misc, 1 each by Miscellaneous route daily at 10am., Disp: 50 each, Rfl: 5  ???  MEDICAL SUPPLY ITEM, Entreal formula nutritionally supplement complete caloric density, Disp: 3 application, Rfl: 3  ???  mirtazapine (REMERON) 7.5 MG tablet, Take 1 tablet (7.5 mg total) by mouth nightly., Disp: 90 tablet, Rfl: 1  ???  polyethylene glycol (MIRALAX) 17 gram packet, Take 17 g by mouth daily., Disp: 30 packet, Rfl: 11  ???  tiotropium (SPIRIVA WITH HANDIHALER) 18 mcg inhalation capsule, Use once daily, Disp: 31 each, Rfl: 11    Allergies  Lisinopril, Losartan, and Hctz [hydrochlorothiazide]    Family History   Problem Relation Age of Onset   ??? Breast cancer Daughter 71   ??? Diabetes Mother    ??? Glaucoma Father    ??? Colon cancer Neg Hx    ??? Ovarian cancer Neg Hx    ??? Endometrial cancer Neg Hx    ??? Anesthesia problems Neg Hx    ??? Bleeding Disorder Neg Hx        Social History  Social History     Tobacco Use   ??? Smoking status: Every Day     Packs/day: 1.00     Years: 57.00     Pack years: 57.00     Types: Cigarettes   ??? Smokeless tobacco: Never   Vaping Use   ??? Vaping Use: Never used   Substance Use Topics   ??? Alcohol use: No     Alcohol/week: 0.0 standard drinks   ??? Drug use: No        Physical Exam     ED Triage Vitals   Enc Vitals Group      BP 05/10/22 1359 135/81      Heart Rate 05/10/22 1346 79      SpO2 Pulse --       Resp 05/10/22 1346 16      Temp 05/10/22 1359 37.3 ??C (99.1 ??F)      Temp Source 05/10/22 1359 Oral      SpO2 05/10/22 1346 97 %      Weight 05/10/22 1359 57.2 kg (126 lb 1.7 oz)      Height 05/10/22 1359 1.524 m (5')      Head Circumference --       Peak Flow --       Pain Score --       Pain Loc --       Pain Edu? --       Excl. in GC? --      Constitutional: Elderly appearing female in no acute distress.  Answering questions appropriately.  Alert and oriented x3.  Daughters present at bedside.  Eyes: Conjunctivae are normal.  ENT       Head: Normocephalic and atraumatic.       Nose: No congestion.       Mouth/Throat: Mucous membranes are moist.       Neck: No stridor.  Hematological/Lymphatic/Immunilogical: No cervical lymphadenopathy.  Cardiovascular: Normal rate, regular rhythm. Normal and symmetric distal pulses are present in all extremities.  Respiratory: Normal respiratory effort. Breath sounds are normal.  Gastrointestinal: Soft and nontender. There is no CVA tenderness.  Mild mid epigastric tenderness appreciated.  Stoma well-appearing, pink, protuberant.  Genitourinary: Deferred  Musculoskeletal: Normal range of motion in all extremities.       Right lower leg: No tenderness  or edema.       Left lower leg: No tenderness or edema.  Neurologic: Normal speech and language. No gross focal neurologic deficits are appreciated.  Skin: Skin is warm, dry and intact. No rash noted.  Psychiatric: Mood and affect are normal. Speech and behavior are normal.     Radiology     CT Abdomen Pelvis W IV Contrast   Final Result   Right hydroureteronephrosis with urothelial enhancement suggestive of pyelitis and concerning for ascending urinary tract infection. Recommend correlation with urinalysis.      New left mild hydronephrosis.      Additional chronic and incidental findings, as detailed in the body of the report.      XR Chest 2 views   Preliminary Result   Unchanged chronic hiatal hernia with indeterminate age bibasilar opacities that may represent infection/aspiration or atelectasis/scarring.        Mercy Riding, MD  Anesthesiology (PGY-1)  Pager Number [phone] Mercy Riding, MD  Resident  05/10/22 [phone]

## 2022-05-10 NOTE — Unmapped (Signed)
Recent:   What is the date of your last related visit?  N/a  Related acute medications Rx'd:  n/a  Home treatment tried:  n/a    Relevant:   Allergies: Lisinopril, Losartan, and Hctz [hydrochlorothiazide]  Medications: n/a   Health History: n/a  Weight: n/a    [place] [facility] patients only:  What was the date of your last cancer treatment (mm/dd/yy)?: n/a  Was the treatment oral or infusion?: n/a  Are you currently on TVEC (yes/no)?: n/a    Dispo: see HCP within 4 hrs. Warm transferred to office to schedule appt with UC today.     Reason for Disposition   [1] Coughed up blood AND [2] > 1 tablespoon (15 ml)    Answer Assessment - Initial Assessment Questions  1. ONSET: When did the cough begin?       Cough started this morning 5/11  2. SEVERITY: How bad is the cough today? Did the blood appear after a coughing spell?       Cough is mild and intermittent  3. SPUTUM: Describe the color of your sputum (none, dry cough; clear, white, yellow, green)      Unable to answer  4. HEMOPTYSIS: How much blood? (flecks, streaks, tablespoons, etc.)      Unable to answer how much blood. Reports a larger amount x1 and then a smaller amount x1  5. DIFFICULTY BREATHING: Are you having difficulty breathing? If Yes, ask: How bad is it? (e.g., mild, moderate, severe)     - MILD: No SOB at rest, mild SOB with walking, speaks normally in sentences, can lie down, no retractions, pulse < 100.     - MODERATE: SOB at rest, SOB with minimal exertion and prefers to sit, cannot lie down flat, speaks in phrases, mild retractions, audible wheezing, pulse 100-120.     - SEVERE: Very SOB at rest, speaks in single words, struggling to breathe, sitting hunched forward, retractions, pulse > 120       Reports pain with deep breath. Denies feeling SOB  6. FEVER: Do you have a fever? If Yes, ask: What is your temperature, how was it measured, and when did it start?      Denies   7. CARDIAC HISTORY: Do you have any history of heart disease? (e.g., heart attack, congestive heart failure)      N/a  8. LUNG HISTORY: Do you have any history of lung disease?  (e.g., pulmonary embolus, asthma, emphysema)      Diaphragmatic hernia  9. PE RISK FACTORS: Do you have a history of blood clots? (or: recent major surgery, recent prolonged travel, bedridden)      N/a  10. OTHER SYMPTOMS: Do you have any other symptoms? (e.g., runny nose, wheezing, chest pain)        Denies other s/s  11. PREGNANCY: Is there any chance you are pregnant? When was your last menstrual period?        No  12. TRAVEL: Have you traveled out of the country in the last month? (e.g., travel history, exposures)        No    Protocols used: Coughing Up Blood-A-AH

## 2022-05-10 NOTE — Unmapped (Signed)
Pt comes in with complaint of upper right quadrant abdominal pain that started this morning. Reports denies any nausea or vomiting, but reports when she is coughing,she is coughing up some blood. Pt reports dark stools as well.

## 2022-05-11 ENCOUNTER — Ambulatory Visit: Admit: 2022-05-11 | Payer: MEDICARE

## 2022-05-11 DIAGNOSIS — C670 Malignant neoplasm of trigone of bladder: Principal | ICD-10-CM

## 2022-05-11 DIAGNOSIS — N1330 Unspecified hydronephrosis: Principal | ICD-10-CM

## 2022-05-11 LAB — CBC
HEMATOCRIT: 37.3 % (ref 34.0–44.0)
HEMOGLOBIN: 12.1 g/dL (ref 11.3–14.9)
MEAN CORPUSCULAR HEMOGLOBIN CONC: 32.5 g/dL (ref 32.0–36.0)
MEAN CORPUSCULAR HEMOGLOBIN: 27.3 pg (ref 25.9–32.4)
MEAN CORPUSCULAR VOLUME: 84 fL (ref 77.6–95.7)
MEAN PLATELET VOLUME: 8.5 fL (ref 6.8–10.7)
PLATELET COUNT: 317 10*9/L (ref 150–450)
RED BLOOD CELL COUNT: 4.44 10*12/L (ref 3.95–5.13)
RED CELL DISTRIBUTION WIDTH: 17.9 % — ABNORMAL HIGH (ref 12.2–15.2)
WBC ADJUSTED: 14.6 10*9/L — ABNORMAL HIGH (ref 3.6–11.2)

## 2022-05-11 LAB — BASIC METABOLIC PANEL
ANION GAP: 8 mmol/L (ref 5–14)
BLOOD UREA NITROGEN: 22 mg/dL (ref 9–23)
BUN / CREAT RATIO: 22
CALCIUM: 9.4 mg/dL (ref 8.7–10.4)
CHLORIDE: 108 mmol/L — ABNORMAL HIGH (ref 98–107)
CO2: 22 mmol/L (ref 20.0–31.0)
CREATININE: 0.98 mg/dL — ABNORMAL HIGH
EGFR CKD-EPI (2021) FEMALE: 59 mL/min/{1.73_m2} — ABNORMAL LOW (ref >=60)
GLUCOSE RANDOM: 191 mg/dL — ABNORMAL HIGH (ref 70–179)
POTASSIUM: 4.3 mmol/L (ref 3.4–4.8)
SODIUM: 138 mmol/L (ref 135–145)

## 2022-05-11 LAB — MAGNESIUM: MAGNESIUM: 1.7 mg/dL (ref 1.6–2.6)

## 2022-05-11 LAB — PHOSPHORUS: PHOSPHORUS: 3.2 mg/dL (ref 2.4–5.1)

## 2022-05-11 MED ADMIN — fluPHENAZine decanoate (PROLIXIN) injection 50 mg: 50 mg | INTRAMUSCULAR | @ 15:00:00

## 2022-05-11 MED ADMIN — cefTRIAXone (ROCEPHIN) 1 g in sodium chloride 0.9 % (NS) 100 mL IVPB-connector bag: 1 g | INTRAVENOUS | @ 18:00:00 | Stop: 2022-05-17

## 2022-05-11 MED ADMIN — enoxaparin (LOVENOX) syringe 40 mg: 40 mg | SUBCUTANEOUS | @ 11:00:00

## 2022-05-11 MED ADMIN — nicotine (NICODERM CQ) 21 mg/24 hr patch 1 patch: 1 | TRANSDERMAL | @ 13:00:00

## 2022-05-11 MED ADMIN — polyethylene glycol (MIRALAX) packet 17 g: 17 g | ORAL | @ 13:00:00

## 2022-05-11 MED ADMIN — acetaminophen (TYLENOL) tablet 650 mg: 650 mg | ORAL | @ 13:00:00

## 2022-05-11 MED ADMIN — umeclidinium (INCRUSE ELLIPTA) 62.5 mcg/actuation inhaler 1 puff: 1 | RESPIRATORY_TRACT | @ 13:00:00

## 2022-05-11 NOTE — Unmapped (Signed)
A & O x 4. From Main ED. No complaint of pain at this time. Oriented to care setting.

## 2022-05-11 NOTE — Unmapped (Addendum)
Tammy Braun is a 81 y.o. female with a past medical history significant for DM II, COPD, HTN, CKD II, T2N0M0 bladder cancer status post radical cystectomy with ileal conduit 10/2011 who presents in setting of new onset right upper quadrant abdominal pain with mild epigastric pain, and brown sputum production. Found to have pyelitis and admitted for IV antibiotics, hospital course by problem below:     # E. Coli Pyelitis  Patient is s/p radical cystectomy with ileal conduit 10/2011. She was previously tx outpatient for UTI on 3/10 with Bactrim, sx resolved. On admission patient had leukocytosis to 18, afebrile, UA +LE, Nitrite concerning for UTI. CT A/P notable for right hydroureteronephrosis w/ urothelial enhancement suggestive of pyelitis and New left mild hydronephrosis. Urology was consulted in the ED given new left hydronephrosis and pyelitis of the right kidney. Urology did not suggest surgical intervention at this time but believed patient would benefit from IV abx. Patient received 3 doses of CTX and was then transitioned to ciprofloxacin of which she will take for 4 more days. She will need urology follow-up in about 6-8 weeks with a renal U/S prior to that.      # Brown Sputum Production - New O2 Req - Pulm. Hypertension:   Patient had multiples episodes of brown and then pink tinged sputum production iso new O2 requirement c/f PE. Pt received a CTA that was negative for PE, but did show enlargement of the right cardiac chambers and main pulmonary artery suggestive of pulmonary arterial hypertension. She has a known history of pulm hypertension. She received a dose of IV lasix and was able to wean off of oxygen by the morning. We will increase her home lasix to 40 daily for 5 days to ensure euvolemia and have close PCP fu. Her sputum is likely related to her continued smoking, but she will follow up with PCP if not resolved.    On 5/14, she was tolerating PO antibiotics and deemed ready for discharge.

## 2022-05-11 NOTE — Unmapped (Addendum)
PHYSICAL THERAPY  Evaluation (05/11/22 1423)          Patient Name:  Tammy Braun       Medical Record Number: [id number]   Date of Birth: 08-07-1941  Sex: Female        Treatment Diagnosis: order: Eval and treat     Activity Tolerance: Tolerated treatment well     ASSESSMENT  Problem List: Decreased mobility, Decreased strength, Decreased endurance, Pain, Impaired sensation, Decreased safety awareness, Decreased range of motion, Gait deviation      Assessment : Tammy Braun is a 81 y.o. female with a past medical history significant for DM II, COPD, HTN, CKD II, T2N0M0 bladder cancer status post radical cystectomy with ileal conduit 10/2011  presents in setting of new onset right upper quadrant abdominal pain with mild epigastric pain, and brown sputum production. Patient will be admitted for acute complicated UTI requiring IV abx.     On examination, pt shows decreased functional strength with transfers; required mod assist for sit <> stand with RW, however uses a lift recliner at home. Ambulated a total of 125' with RW SBA-CGA, needed increased cueing for keeping RW within BOS (especially on turns). Ther ex for quadriceps and hip flexors given, noted decreased eccentric LE strength. Has good family support from daughter at home who is caregiver.    Based on the AM-PAC 5 item raw score of 14/20, the patient is considered to be 48.43% impaired with basic mobility. This indicates pt is appropriate for 3X at discharge to help with functional mobility, strength, and endurance.    After a review of the personal factors, comorbidities, clinical presentation, and examination of the number of affected body systems, the patient presents as a low complexity case.      Today's Interventions: PT eval, bed mobility, transfer, ambulation, ther ex: seated march, LAQ. all x10; self care in bathroom                     Clinical Decision Making: Low      PLAN  Planned Frequency of Treatment:  1-2x per day for: 3-4x week Planned Interventions: Airway Clearance, Balance activities, Diaphragmatic / Pursed-lip breathing, Education - Patient, Education - Family / caregiver, Investment banker, operational, Home exercise program, Functional mobility, Endurance activities, Self-care / Home training, Stair training, Therapeutic exercise, Therapeutic activity, Transfer training     Post-Discharge Physical Therapy Recommendations:  3x weekly     PT DME Recommendations: None            Goals:   Patient and Family Goals: get better              SHORT GOAL #1: Pt will perform bed mobility mod I.               Time Frame : 1 week  SHORT GOAL #2: Pt will perform sit <> stand with min assist with LRAD              Time Frame : 2 weeks  SHORT GOAL #3: Pt will ambulate 100' with supervision with LRAD              Time Frame : 2 weeks  SHORT GOAL #4: Pt will perform 4 STE with unilateral railing and LRAD CGA.               Time Frame : 2 weeks  Prognosis:        Barriers to Discharge: Functional strength deficits, Decreased range of motion     SUBJECTIVE  Patient reports: agreeable to PT Did I do good? after session  Current Functional Status: pt received in chair and left in chair, all needs within reach, RN in room, family in room  Services patient receives: OT, PT  Prior Functional Status: no falls within the past 6 months, uses RW within the house, [facility] on the stairs and short distances, and RW for longer distances. outside the home, uses a w/c (i.e. doctors, grocery store). sits in lift recliner mostly  Equipment available at home: Rolling walker, Straight cane, Wheelchair-manual, Lift-recliner, Paediatric nurse with back      Past Medical History:   Diagnosis Date    Anxiety     Arthritis     Breast cyst     Cancer (CMS-HCC)     bladder    Depression, psychotic (CMS-HCC)     Diabetes mellitus (CMS-HCC)     in past    Hernia     History of transfusion     Hypertension     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/08/2015            Social History Tobacco Use    Smoking status: Every Day     Packs/day: 0.75     Years: 57.00     Pack years: 42.75     Types: Cigarettes    Smokeless tobacco: Never    Tobacco comments:     15cpd   Substance Use Topics    Alcohol use: No     Alcohol/week: 0.0 standard drinks       Past Surgical History:   Procedure Laterality Date    ABDOMINAL SURGERY      BLADDER SURGERY      BREAST CYST EXCISION      CHEMOTHERAPY  2012    bladder    GALLBLADDER SURGERY      stone removal    ILEOSTOMY  2012    PR COLONOSCOPY FLX DX W/COLLJ SPEC WHEN PFRMD N/A 02/09/2015    Procedure: COLONOSCOPY, FLEXIBLE, PROXIMAL TO SPLENIC FLEXURE; DIAGNOSTIC, W/WO COLLECTION SPECIMEN BY BRUSH OR WASH;  Surgeon: Dewaine Conger, MD;  Location: [facility] ;  Service: Gastroenterology    PR RECONSTR TOTAL SHOULDER IMPLANT Left 04/08/2018    Procedure: ARTHROPLASTY, GLENOHUMERAL JOINT; TOTAL SHOULDER(GLENOID & PROXIMAL HUMERAL REPLACEMENT(EG, TOTAL SHOULDER);  Surgeon: Tomasa Rand, MD;  Location: [facility];  Service: Ortho Sports Medicine    PR SIGMOIDOSCOPY,BIOPSY N/A 03/11/2015    Procedure: SIGMOIDOSCOPY, FLEXIBLE; WITH BIOPSY, SINGLE OR MULTIPLE;  Surgeon: Wilburt Finlay, MD;  [facility]: [facility];  Service: Gastroenterology             Family History   Problem Relation Age of Onset    Breast cancer Daughter 48    Diabetes Mother     Glaucoma Father     Colon cancer Neg Hx     Ovarian cancer Neg Hx     Endometrial cancer Neg Hx     Anesthesia problems Neg Hx     Bleeding Disorder Neg Hx         Allergies: Lisinopril, Losartan, and Hctz [hydrochlorothiazide]                  Objective Findings  Precautions / Restrictions  Precautions: Falls precautions (delirium precautions)  Weight Bearing Status: Non-applicable  Required Braces  or Orthoses: Non-applicable     Communication Preference: Verbal          Pain Comments: 6/10 pain in L hip and knee  Medical Tests / Procedures: VSS, imaging, notes orders per EPIC  Equipment / Environment: Urostomy, Vascular access (PIV, TLC, Port-a-cath, PICC), Caregiver not wearing mask for full session, Patient not wearing mask for full session, Supplemental oxygen     At Rest: VSS per EPIC; 96% on 2L  With Activity: 97% SpO2 on 2L halfway through ambualtion, 99% on 2L after activity  Orthostatics: asymptomatic  Airway Clearance: ambulation     Living Situation  Living Environment: House  Lives With: Daughter (daughter is caregiver)  Home Living: One level home, Tub/shower unit, Standard height toilet, Stairs to enter with rails, Hand-held shower hose, Accessible via walker, Shower chair with back  Rail placement (outside): Bilateral rails (not able to reach both at the same time, primarily uses L side)  Number of Stairs to Enter (outside): 4      Cognition: WFL  Visual/Perception: Wears Glasses/Contacts     Skin Inspection: Intact where visualized     Upper Extremities  UE ROM: Right Impaired/Limited, Left Impaired/Limited  RUE ROM Impairment: Limited AROM, Pain with movement  LUE ROM Impairment: Limited AROM, Pain with movement  UE Strength: Right Impaired/Limited, Left Impaired/Limited  RUE Strength Impairment: Reduced strength, Pain with movement  LUE Strength Impairment: Reduced strength, Pain with movement  UE comment: R>L AROM limited, L 90 degrees flexion, R ~45 degrees AROM flexion, hx of  L rTSA with biceps tenodesis    Lower Extremities  LE ROM: Right WFL, Left WFL  LE Strength: Left Impaired/Limited  LLE Strength Impairment: Pain with movement  LE comment: L>R knee extension AROM decreased, R leg MMT 4+/5;, L knee flexion/ext 4/5 but 4+/5 otherwise     Coordination: Not tested  Proprioception: Not tested  Sensation: Impaired  Sensation comment: neuropathy in hands and feet  Balance: Standing balance (needs UE support), Impaired dynamic standing balance  Posture: WFL      Bed Mobility: NT     Transfers: Sit to Stand  Sit to Stand assistance level: Moderate assist, patient does 50-74%  Transfer comments: needed rocking momentum, sit <> stand with RW from recliner and commode mod assist; usually sits on lift recliner      Gait Level of Assistance: Contact guard assist, steadying assist  Gait Assistive Device: Front wheel walker  Gait Distance Ambulated (ft): 125 ft  Gait: pt ambulated 100'x1 and 25'x1 with RW SBA, CGA on turns as pt had RW outside BOS;, increased cueing to keep RW within BOS throughout, flexed posture                  Endurance: good     Physical Therapy Session Duration  PT Individual [mins]: 30     Medical Staff Made Aware: RN Lelon Mast     I attest that I have reviewed the above information.  Signed: Levon Hedger, PT  Filed 05/11/2022

## 2022-05-11 NOTE — Unmapped (Signed)
This patient is being admitted to the [facility] Medicine Inpatient Service which is located at [facility]. The patient is currently located at [facility] in [place].     I will be monitoring the patient peripherally via the notes, flowsheet, and results tabs but am not available to evaluate the patient in person while they are located at [facility].     Plan for management prior to transition:    # Abdominal Pain 2/2 Complicated UTI/Pyelitis:   - s/p Ceftriaxone x1 in the ED   - Encourage PO intake   - F/U urine culture     For any emergent needs or concerns that require evaluation, the ED resident or attending should be consulted.     For non-emergent, but still time sensitive, issues I can be reached via the [facility] Medicine Admissions Pager [phone]). For all other issues I can be reached via [employer].     I have sent a secure chat message to the primary RN and current attending physician or resident caring for the patient as well.     Landry Dyke, MD PGY2  May 10, 2022 10:29 PM

## 2022-05-11 NOTE — Unmapped (Cosign Needed)
Inpatient Tobacco Cessation Counseling Note    This medical encounter was conducted virtually using [email or web address]  TeleHealth protocols.    I have identified myself to the patient and conveyed my credentials to Ms. Westley  I have explained the capabilities and limitations of telemedicine and the patient/proxy and myself both agree that it is appropriate for their current circumstances/symptoms.     Contact Information  Person Contacted: Trinika Cortese         Contact Phone number: [phone]      Phone Outcome: spoke with pt  Is there someone else in the room? No.     Patient's location at the time of the telephone visit: Hospitalized at [facility]   Provider's location at the time of the telephone visit: At home, in [place]      Purpose of contact:     Tobacco Treatment Team (TTP) received inpatient consult from [facility] Medicine for Ms. [name], 81 y.o. female. Pt participated in a telephone visit for tobacco cessation counseling.  Patient was admitted to hospital for pyelitis. Patient consented to telephone visit given due to social isolation measures in place due to the COVID-19 pandemic.     Tobacco Use History and Assessment  Time Since Last Tobacco Use: 1 to 7 days ago  Tobacco Withdrawal (Past 24 Hours): None noted  Type of Tobacco Products Used: Cigarettes  Quantity Used: 15  Quantity Per: day  Other Household Members Use Tobacco: No  Smoking Allowed in Home: No  Medications Used in Past Attempts: Nicotine Gum, Nicotine Patch  Previously seen by NDP?: Hospital Inpatient    Behavioral Assessment  Why Uses: 1. habit 2. stress-relief  Reasons to Become Tobacco Free: health  Barriers/Challenges: 1. long-standing habit 2. stress  Strategies: pt can use 1. NRT to manage cravings 2. hand-to-mouth replacements    NOTE: Pt denies having cravings to smoke and states she smokes approx 15cpd. Pt lives with her daughter, who does not smoke. Smoking is not allowed inside the home. SW and pt discussed benefits of becoming smoke-free after discharge in context of pyelitis; SW reviewed carcinogens and toxins in a cigarette that have inflammatory effects and consequences for healing. Pt states she tried nicotine gum and the patch in the past and these were helpful; pt confirmed she has on a patch now and it has helped manage cravings. SW reviewed nicotine gum availability while pt is in the hospital and correct way to use it (chew and park method). Pt agreed to continue using the patch and gum after discharge. SW provided pt with contact information, physical improvements related to tobacco cessation, and available resources (including outpatient Tobacco Treatment Program at [facility] Medicine and [facility]).    Treatment Plan  Please see below for medication recommendations in bold.   Cessation Meds Currently Using: Patch 21mg   Medications Recommended During Hospitalization: Patch 21mg , Gum 4mg   Outpatient/Discharge Medications Recommended: Patch 21mg , Gum 4mg   Plan to Obtain Outpatient Meds: TTS messaged providers for Rx  Patient's Plan Post Discharge/Visit: Plan to quit as soon as possible    As part of this Telephone Visit, no in-person exam was conducted.     I personally spent 15 minutes counseling the patient via telephone about tobacco cessation.  I spent an additional 11 minutes on pre- and post-visit activities.      The patient was physically located in [place] or a state in which I am permitted to provide care. The patient and/or parent/guardian understood that s/he  may incur co-pays and cost sharing, and agreed to the telemedicine visit. The visit was reasonable and appropriate under the circumstances given the patient's presentation at the time.     The patient and/or parent/guardian has been advised of the potential risks and limitations of this mode of treatment (including, but not limited to, the absence of in-person examination) and has agreed to be treated using telemedicine. The patient's/patient's family's questions regarding telemedicine have been answered.      If the visit was completed in an ambulatory setting, the patient and/or parent/guardian has also been advised to contact their provider???s office for worsening conditions, and seek emergency medical treatment and/or call 911 if the patient deems either necessary.     Visit Format/Coding: Telephone     Coding: [address] (11-20 minutes)  Service rendered over the phone most consistent with: Tobacco cessation counseling, greater than 10 minutes (60454)      Venancio Poisson, LCSW, NCTTP  Clinical Social Worker / Tobacco Treatment Specialist  [facility]  Phone: [phone]

## 2022-05-11 NOTE — Unmapped (Signed)
CM spoke with patient's daughter who is agreeable to Home Health services and would like to use [facility].   Equipment available at home: Rolling walker, Straight cane, Wheelchair-manual, Lift-recliner, Paediatric nurse with back      Care Management  Initial Transition Planning Assessment   Type of Residence: Mailing Address:  [address] [place] [address]  Contacts: Accompanied by: Family member  Patient Phone Number: [phone] (home)           Medical Provider(s): Evette Cristal, MD  Reason for Admission: Admitting Diagnosis:  No admission diagnoses are documented for this encounter.  Past Medical History:   has a past medical history of Anxiety, Arthritis, Breast cyst, Cancer (CMS-HCC), Depression, psychotic (CMS-HCC), Diabetes mellitus (CMS-HCC), Hernia, History of transfusion, Hypertension, and Pulmonary emphysema (CMS-HCC) (05/08/2015).  Past Surgical History:   has a past surgical history that includes Bladder surgery; Gallbladder surgery; Ileostomy (2012); Abdominal surgery; pr colonoscopy flx dx w/collj spec when pfrmd (N/A, 02/09/2015); pr sigmoidoscopy,biopsy (N/A, 03/11/2015); Chemotherapy (2012); Breast cyst excision; and pr reconstr total shoulder implant (Left, 04/08/2018).   Previous admit date: 06/23/2020    Primary Insurance- Payor: MEDICARE / Plan: MEDICARE PART A AND PART B / Product Type: *No Product type* /   Secondary Insurance - Secondary Insurance  MEDICAID [place]  Prescription Coverage - Medicaid  Preferred Pharmacy - [facility] [phone] - [place], [place] - [address] AT [facility] OF [address]  [id number] - [place] - [address]  [facility]  [facility]    Transportation home: Private vehicle, daughter               General  Care Manager assessed the patient by : In person interview with patient, Telephone conversation with family, Medical record review, Discussion with Clinical Care team  Orientation Level: Oriented X4  Functional level prior to admission: Independent  Reason for referral: Discharge Planning, Home Health    Contact/Decision Maker  Extended Emergency Contact Information  Primary Emergency Contact: Dava Najjar [place] Phone: [phone]  Relation: Daughter  Secondary Emergency Contact: Curtis,Betty   [place] Phone: [phone]  Relation: Daughter    Legal Next of Kin / Guardian / POA / Advance Directives     HCDM (patient stated preference): [name] - Daughter - [phone]    Advance Directive (Medical Treatment)  Does patient have an advance directive covering medical treatment?: Patient does not have advance directive covering medical treatment.    Health Care Decision Maker [HCDM] (Medical & Mental Health Treatment)  Healthcare Decision Maker: Patient does not wish to appoint a Health Care Decision Maker at this time  Information offered on HCDM, Medical & Mental Health advance directives:: Patient declined information.         Readmission Information    Have you been hospitalized in the last 30 days?: No     Patient Information  Lives with: Children    Type of Residence: Private residence [address] [place] [address]      Support Systems/Concerns: Children    Responsibilities/Dependents at home?: No    Home Care services in place prior to admission?: No       Outpatient/Community Resources in place prior to admission: Clinic       Equipment Currently Used at Home: other (see comments)  Current HME  Agency (Name/Phone #): Equipment available at home: Rolling walker, Straight cane, Wheelchair-manual, Lift-recliner, Paediatric nurse with back    Currently receiving outpatient dialysis?: No    Financial Information    Need for financial assistance?: No    Social Determinants of Health  Social Determinants of Health were addressed in provider documentation.  Please refer to patient history.  Social Determinants of Health     Financial Resource Strain: Low Risk     Difficulty of Paying Living Expenses: Not hard at all   Internet Connectivity: Not on file   Food Insecurity: No Food Insecurity    Worried About Programme researcher, broadcasting/film/video in the Last Year: Never true    Barista in the Last Year: Never true   Tobacco Use: High Risk    Smoking Tobacco Use: Every Day    Smokeless Tobacco Use: Never    Passive Exposure: Not on file   Housing/Utilities: Low Risk     Within the past 12 months, have you ever stayed: outside, in a car, in a tent, in an overnight shelter, or temporarily in someone else's home (i.e. couch-surfing)?: No    Are you worried about losing your housing?: No    Within the past 12 months, have you been unable to get utilities (heat, electricity) when it was really needed?: No   Alcohol Use: Not on file   Transportation Needs: No Transportation Needs    Lack of Transportation (Medical): No    Lack of Transportation (Non-Medical): No   Substance Use: Not on file   Health Literacy: Low Risk     : Never   Physical Activity: Not on file   Interpersonal Safety: Not on file   Stress: Not on file   Intimate Partner Violence: Not on file   Depression: Not on file   Social Connections: Not on file       Complex Discharge Information    Is patient identified as a difficult/complex discharge?: No    Discharge Needs Assessment  Concerns to be Addressed: denies needs/concerns at this time, discharge planning    Clinical Risk Factors: > 65, New Diagnosis, Principal Diagnosis: Cancer, Stroke, COPD, Heart Failure, AMI, Pneumonia, Joint Replacment, Functional Limitations    Barriers to taking medications: No    Prior overnight hospital stay or ED visit in last 90 days: No      Anticipated Changes Related to Illness: none    Equipment Needed After Discharge: none    Discharge Facility/Level of Care Needs: other (see comments) (HH)    Readmission  Risk of Unplanned Readmission Score: UNPLANNED READMISSION SCORE: 19.01%  Predictive Model Details          19% (Medium)  Factor Value    Calculated 05/11/2022 12:02 23% Number of active Rx orders 33    [place] Risk of Unplanned Readmission Model 10% Diagnosis of cancer present     9% Active antipsychotic Rx order present     9% ECG/EKG order present in last 6 months     8% Diagnosis of electrolyte disorder present     7% Imaging order present in last 6 months     6% Age 37     6% Phosphorous result present     6% Charlson Comorbidity Index 5     6% Number of ED visits in last six months 1     4% Active anticoagulant Rx order present     4% Latest creatinine high (0.92 mg/dL)  2% Future appointment scheduled     1% Current length of stay 0.588 days      Readmitted Within the Last 30 Days? (No if blank)   Patient at risk for readmission?: No    Discharge Plan  Screen findings are: Discharge planning needs identified or anticipated (Comment).    Expected Discharge Date:     Expected Transfer from Critical Care:  (N/A)    Quality data for continuing care services shared with patient and/or representative?: Yes  Patient and/or family were provided with choice of facilities / services that are available and appropriate to meet post hospital care needs?: Yes   List choices in order highest to lowest preferred, if applicable. : [facility]    Initial Assessment complete?: Yes

## 2022-05-11 NOTE — Unmapped (Signed)
Family Medicine Inpatient Service    Progress Note    Team: [facility] (pgr [phone])    Hospital Day: 1    ASSESSMENT / PLAN:   Tammy Braun is a 81 y.o. female witha past medical history significant for DM II, COPD, HTN, CKD II, T2N0M0 bladder cancer status post radical cystectomy with ileal conduit 10/2011  presents in setting of new onset right upper quadrant abdominal pain with mild epigastric pain, and brown sputum production. Patient will be admitted for acute complicated UTI requiring IV abx.     # E. Coli Pyelitis  Patient is s/p radical cystectomy with ileal conduit 10/2011. She was previously tx outpatient for UTI on 3/10 with Bactrim, sx resolved. On admission patient had leukocytosis to 18, afebrile, UA +LE, Nitrite concerning for UTI. CT A/P notable for right hydroureteronephrosis w/ urothelial enhancement suggestive of pyelitis and New left mild hydronephrosis. Urology was consulted in the ED given new left hydronephrosis and pyelitis of the right kidney. Urology did not suggest surgical intervention at this time but believe patient would benefit from IV abx.    - Continue CTX  - Will narrow to PO abx when we have susceptibilities  - Trend WBC     # Brown Sputum Production  No PNA seen on CXR. If patient has prolonged O2 requirement, will proceed with CTA to rule out PE.    # Moderate hiatal hernia   CXR and CT was notable for large hiatal hernia which could also be contributing to abdominal pain and dark sputum production which has resolved. Given VSS and nrml Hgb with no other risk factors (no chronic NSAID use, PUD, blood thinner,etc.) lower concern for upper GI bleed. Hgb stable. No further work up is indicated at this time.      # DM II  Last A1C 6.6 on 04/11/22.   -SSI     # COPD   Cont home meds.   - Spiriva   - albuterol prn     # HTN   On Lasix and amlodipine 10mg , candesartan 8 mg   - cont amlodipine 10mg    - Initial Cr stable, will follow up BMP and restart other home meds if appropriate     # CKD II   Baseline Cr 0.9-1.01. Cr is at baseline      # Moderate/Severe Tricuspid Regurgitation   - currently asymptomatic without evidence of volume overload.       # Schizoaffective disorder  Prolixin 50 mg IM q30 days, due 5/12  - ordered Prolixin   - cont home mirtazapine      # Tobacco use disorder: 57.00 pack year smoking hx. Patient currently smoking 1 PPD. Will place inpatient consult to tobacco cessation for toxic effects of tobacco with hospital diagnosis tobacco use disorder and possible nicotine withdrawal.  - Tobacco cessation consult  - Nicotine replacement therapy with patch, gum, lozenge PRN      # Checklist:  - IVF None  - Tubes/Lines/Drains: PIV  - Diet Regular  - Bowel Regimen: Miralax 17 g daily and Senna 2 tabs qHS  - DVT: SQ Lovenox  - Code Status:   Orders Placed This Encounter   Procedures    Full Code     Standing Status:   Standing     Number of Occurrences:   1     - Dispo: Floor    [ ]  Anticipated Discharge Location: Home  [ ]  PT/OT/DME: No needs anticipated  [ ]   CM/SW needs: None anticipated  [ ]  Meds/Rx:  Not yet prescribed. No special med needs  [ ]  Teaching: None anticipated  [ ]  Follow up appt: Appointment needed  [ ]  Excuse letter: None anticipated  [ ]  Transport: Private Needed    SUBJECTIVE:  Interval events: Feeling well this morning. Continues to have some abdominal pain but up and OOB, sitting in chair eating.       REVIEW OF SYSTEMS:  Pertinent positives and negatives per HPI. A complete review of systems otherwise negative.    PHYSICAL EXAM:      Intake/Output Summary (Last 24 hours) at 05/11/2022 1218  Last data filed at 05/11/2022 0230  Gross per 24 hour   Intake --   Output 400 ml   Net -400 ml       Recent Vitals:  Vitals:    05/11/22 0850   BP:    Pulse:    Resp:    Temp:    SpO2: 97%       GEN: well appearing, sitting in chair eating, NAD   HEENT: NCAT, No scleral icterus. Conjunctiva non-erythematous. MMM.  CV: Regular rate and rhythm. No murmurs/rubs/gallops.  Pulm: Normal work of breathing on RA. CTAB. No wheezing, crackles, or rhonchi.  Abd: Flat. Non-tender. No guarding, rebound. R sided CVA tenderness  Neuro: A&O x 3. No focal deficits.  Ext: No peripheral edema.  Palpable distal pulses.  Skin: No rashes or skin lesions.     LABS/ STUDIES:    All imaging, laboratory studies, and other pertinent tests including electrocardiography within the last 24 hours were reviewed and are summarized within the assessment and plan.     NUTRITION:                         Sharlene Motts, MD PGY1  May 11, 2022 12:18 PM

## 2022-05-11 NOTE — Unmapped (Signed)
UROLOGY FOLLOW UP REQUEST    Requesting physician/contact for questions: Nolon Bussing Ronalda Walpole      Date(s) Needed: 6-8 weeks    Appt to be scheduled under: Attending clinic - Dr. Donnella Bi     Type of Appt: Follow-up    Need to contact patient: Yes please    Special Requests/Instructions: RUS prior    Orders placed for Special Requests: Yes    Thank you!

## 2022-05-11 NOTE — Unmapped (Signed)
UROLOGY CONSULT NOTE    Requesting Attending Physician:  Lawrence Santiago Myrex, MD  Service Requesting Consult:  Family Medicine [facility])  Service Providing Consult: SRU  Consulting Attending: Dr. Donnella Bi    Assessment:  Patient is a 81 y.o. female with a PMH of bladder cancer s/p neoadjuvant chemotherapy and RC/IC in October 2012 (pT2N0M0), COPD, HTN who presented with epigastric/RUQ pain and CT findings c/f pyelonephritis.    Afebrile and HDS. WBC 18.2. Cr 0.92. UA from conduit large LE, positive nitrites, 6 WBC. Ucx pending. CT A/P with evidence of pyelonephritis and b/l mild hydroureteronephrosis    Patient's clinical picture is most consistent with pyelonephritis. She does have bilateral hydroureteronephrosis on CT, which is commonly seen in patients with ileal conduits and does not appear out of proportion to expected hydronephrosis. Given these findings and stable Cr, no role at present for decompression with PCNs. Recommend admission for IV abx for treatment of pyelonephritis.    Recommendations:  No acute Urologic intervention indicated  Recommend medicine consultation for evaluation and management of pyelonephritis  No utility to loopogram at this time; unlikely to have stricture this distant post-op  Follow-up in clinic with Dr. Katrinka Blazing in 6-8 weeks with RUS prior    Discussed with Dr. Blima Ledger, chief Urology resident on call, and Dr. Katrinka Blazing. Urology will sign-off at this time.  Thank you for this consult. Please page [phone] with any questions or concerns.    History of Present Illness:   Tammy Braun is seen in consultation for RUQ/epigastric pain, possible pyelo at the request of Palee Earley Brooke, MD on the Family Medicine [facility]). Patient reports presence of vague RUQ/epigastric pain over the last day. She reports this generally improves with food intake. She denies fevers, flank pain, nausea, vomiting. She does note presence of brown sputum over the preceding days. She notes her conduit is draining without issue and the urine is clear yellow.     Past Medical History:  Past Medical History:   Diagnosis Date    Anxiety     Arthritis     Breast cyst     Cancer (CMS-HCC)     bladder    Depression, psychotic (CMS-HCC)     Diabetes mellitus (CMS-HCC)     in past    Hernia     History of transfusion     Hypertension     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/08/2015       Past Surgical History:   Past Surgical History:   Procedure Laterality Date    ABDOMINAL SURGERY      BLADDER SURGERY      BREAST CYST EXCISION      CHEMOTHERAPY  2012    bladder    GALLBLADDER SURGERY      stone removal    ILEOSTOMY  2012    PR COLONOSCOPY FLX DX W/COLLJ SPEC WHEN PFRMD N/A 02/09/2015    Procedure: COLONOSCOPY, FLEXIBLE, PROXIMAL TO SPLENIC FLEXURE; DIAGNOSTIC, W/WO COLLECTION SPECIMEN BY BRUSH OR WASH;  Surgeon: Dewaine Conger, MD;  Location: [facility] [place];  Service: Gastroenterology    PR RECONSTR TOTAL SHOULDER IMPLANT Left 04/08/2018    Procedure: ARTHROPLASTY, GLENOHUMERAL JOINT; TOTAL SHOULDER(GLENOID & PROXIMAL HUMERAL REPLACEMENT(EG, TOTAL SHOULDER);  Surgeon: Tomasa Rand, MD;  Location: [facility];  Service: Ortho Sports Medicine    PR SIGMOIDOSCOPY,BIOPSY N/A 03/11/2015    Procedure: SIGMOIDOSCOPY, FLEXIBLE; WITH BIOPSY, SINGLE OR MULTIPLE;  Surgeon: Wilburt Finlay, MD;  Location: [facility];  Service: Gastroenterology       Medication:  Current Facility-Administered Medications   Medication Dose Route Frequency Provider Last Rate Last Admin    fluPHENAZine decanoate (PROLIXIN) injection 50 mg  50 mg Intramuscular Q30 Days Lovett Calender, MD   50 mg at 04/09/22 1125     Current Outpatient Medications   Medication Sig Dispense Refill    DULoxetine (CYMBALTA) 20 MG capsule Take 1 capsule (20 mg total) by mouth daily. 90 capsule 0    fluPHENAZine decanoate (PROLIXIN) 25 mg/mL injection Inject 2 mL (50 mg total) into the muscle every thirty (30) days. 5 mL 11    albuterol HFA 90 mcg/actuation inhaler Inhale 2 puffs every four (4) hours as needed for wheezing. 18 g 2    blood sugar diagnostic (ACCU-CHEK AVIVA PLUS TEST STRP) Strp by Other route daily at 10am. 50 strip 5    blood-glucose meter kit Use to check blood sugar four times daily or as directed by MD. Dx: Diabetes, E08.00 *DISPENSE ACCU-CHEK AVIVA PLUS BRAND* 1 each 0    candesartan (ATACAND) 8 MG tablet Take 0.5 tablets (4 mg total) by mouth daily. 30 tablet 11    cyclobenzaprine (FLEXERIL) 5 MG tablet Take 1 tablet (5 mg total) by mouth nightly. 30 tablet 5    furosemide (LASIX) 20 MG tablet Take 1 tablet (20 mg total) by mouth daily. 30 tablet 11    gabapentin (NEURONTIN) 100 MG capsule 1-2 pills tid prn pain and numbness 270 capsule 3    inhalational spacing device (E-Z SPACER) Spcr 1 each by Miscellaneous route every four (4) hours as needed. 1 each 0    lancets (ACCU-CHEK SOFTCLIX LANCETS) Misc 1 each by Miscellaneous route daily at 10am. 50 each 5    MEDICAL SUPPLY ITEM Entreal formula nutritionally supplement complete caloric density 3 application 3    mirtazapine (REMERON) 7.5 MG tablet Take 1 tablet (7.5 mg total) by mouth nightly. 90 tablet 1    polyethylene glycol (MIRALAX) 17 gram packet Take 17 g by mouth daily. 30 packet 11    tiotropium (SPIRIVA WITH HANDIHALER) 18 mcg inhalation capsule Use once daily 31 each 11       Allergies:  Allergies   Allergen Reactions    Lisinopril Anaphylaxis and Swelling    Losartan Dizziness    Hctz [Hydrochlorothiazide]      SIADH       Social History:  Social History     Tobacco Use    Smoking status: Every Day     Packs/day: 1.00     Years: 57.00     Pack years: 57.00     Types: Cigarettes    Smokeless tobacco: Never   Vaping Use    Vaping Use: Never used   Substance Use Topics    Alcohol use: No     Alcohol/week: 0.0 standard drinks    Drug use: No       Family History:  Family History   Problem Relation Age of Onset    Breast cancer Daughter 51    Diabetes Mother     Glaucoma Father Colon cancer Neg Hx     Ovarian cancer Neg Hx     Endometrial cancer Neg Hx     Anesthesia problems Neg Hx     Bleeding Disorder Neg Hx        Review of Systems:  10 systems were reviewed and are negative except as noted specifically in the HPI.  Objective:     Intake/Output last 3 shifts:  No intake/output data recorded.  Vital signs in last 24 hours:  BP 138/87  - Pulse 88  - Temp 37.3 ??C (99.2 ??F) (Oral)  - Resp 20  - Ht 152.4 cm (5')  - Wt 57.2 kg (126 lb 1.7 oz)  - SpO2 91%  - BMI 24.63 kg/m??     Physical Exam:  General:  No acute distress, well appearing and well nourished  HEENT: Normocephalic, atraumatic, pupils equal and round, sclera anicteric  Neck  Trachea midline, symmetrical  Lungs:   Normal work of breathing on room air  Cardiac: Regular rate  Abdomen: Non tender, soft, non distended.  GU:  No CVA tenderness bilaterally. Ileal conduit viable, urine is clear yellow.  Extremities: Warm and well perfused  Neuro:             Alert and oriented, strength and sensation grossly normal      Most Recent Labs:  Recent Labs   Lab Units 05/10/22  1526   WBC 10*9/L 18.2*   RBC 10*12/L 4.95   HEMOGLOBIN g/dL 16.1   HEMATOCRIT % 09.6   MCV fL 85.3   MCH pg 27.1   MCHC g/dL 04.5*   RDW % 40.9*   PLATELET COUNT (1) 10*9/L 344   MPV fL 8.5     Recent Labs   Lab Units 05/10/22  1526   SODIUM mmol/L 136   POTASSIUM mmol/L 4.8   CHLORIDE mmol/L 100   CO2 mmol/L 27.0   BUN mg/dL 20   CREATININE mg/dL 8.11*     Recent Labs   Lab Units 05/10/22  1526   ALT U/L <7*   AST U/L 13   ALK PHOS U/L 91   ALBUMIN g/dL 3.6   BILIRUBIN TOTAL mg/dL 0.6     No results in the last week    Microbiology Data:  No results found for: LABBLOO  Urine Culture, Comprehensive   Date Value Ref Range Status   06/22/2020 (A)  Final    Mixed Gram Positive/Gram Negative Organisms Isolated   02/16/2020 (A)  Final    Mixed Gram Positive/Gram Negative Organisms Isolated   11/26/2016 >100,000 CFU/mL Klebsiella pneumoniae (A)  Final   11/26/2016 10,000 to 50,000 CFU/mL Pseudomonas aeruginosa (A)  Final     Comment:     Susceptibility Testing By Consultation Only   08/02/2013 (A)  Final    30,000 CFU/ML MIXED UROGENITAL FLORA  Reference Range: (Lower detectable limit 1000 cfu/ml)  (Lower detectable limit for Acute dysuria, Suprapubic tap,  and Post-prostatic massage specimen types = 100 cfu/ml)   08/02/2013   Final    >100,000 CFU/mL  Testing results predict SUSCEPTIBILITY for the oral agents  cefdinir, cefuroxime, and cephalexin when used for therapy of  uncomplicated UTIs due to this organism.   07/07/2013   Final    MIXED GRAM POSITIVE/NEGATIVE ORGANISMS ISOLATED  Reference Range: (Lower detectable limit 1000 cfu/ml)  (Lower detectable limit for Acute dysuria, Suprapubic tap,  and Post-prostatic massage specimen types = 100 cfu/ml)   09/17/2011 FINAL  MIXED UROGENITAL FLORA  Final     Comment:     Reference Range: (Lower detectable limit 1000 cfu/ml)  (Lower detectable limit for Acute dysuria, Suprapubic tap,  and Post-prostatic massage specimen types = 100 cfu/ml)   03/23/2011 FINAL  MIXED UROGENITAL FLORA  Final     Comment:     Reference Range: (Lower detectable limit 1000 cfu/ml)  (  Lower detectable limit for Acute dysuria, Suprapubic tap,  and Post-prostatic massage specimen types = 100 cfu/ml)   03/18/2011 FINAL  NO GROWTH  Final     Comment:     Reference Range: (Lower detectable limit 1000 cfu/ml)  (Lower detectable limit for Acute dysuria, Suprapubic tap,  and Post-prostatic massage specimen types = 100 cfu/ml)     No results found for: ANACX  Most recent Urinalysis:  Recent Labs   Lab Units 05/10/22  1528   LEUKOCYTES UA  Large*   NITRITE UA  Positive*   RBC UA /HPF 4   WBC UA /HPF 6*   SQUAM EPITHEL UA /HPF 1   BACTERIA UA /HPF Few*      Urinalysis History:  Leukocyte Esterase, UA   Date Value Ref Range Status   05/10/2022 Large (A) Negative Final   06/22/2020 Moderate (A) Negative Final   02/16/2020 Moderate (A) Negative Final   11/26/2016 Moderate (A) Negative Final   08/02/2013 +1 NEGATIVE Final   07/07/2013 +2 NEGATIVE Final   09/17/2011 +2 NEGATIVE Final   03/18/2011 NEGATIVE NEGATIVE Final     Nitrite, UA   Date Value Ref Range Status   05/10/2022 Positive (A) Negative Final   06/22/2020 Negative Negative Final   02/16/2020 Positive (A) Negative Final   11/26/2016 Negative Negative Final   08/02/2013 NEGATIVE NEGATIVE Final   07/07/2013 NEGATIVE NEGATIVE Final   09/17/2011 NEGATIVE NEGATIVE Final   03/18/2011 NEGATIVE NEGATIVE Final     RBC, UA   Date Value Ref Range Status   05/10/2022 4 <=4 /HPF Final   06/22/2020 2 <=4 /HPF Final   02/16/2020 3 <4 /HPF Final   11/26/2016 21 (H) <4 /HPF Final     WBC, UA   Date Value Ref Range Status   05/10/2022 6 (H) 0 - 5 /HPF Final   06/22/2020 25 (H) 0 - 5 /HPF Final   02/16/2020 10 (H) 0 - 5 /HPF Final   11/26/2016 12 (H) 0 - 5 /HPF Final     Squam Epithel, UA   Date Value Ref Range Status   05/10/2022 1 0 - 5 /HPF Final   06/22/2020 <1 0 - 5 /HPF Final   02/16/2020 2 0 - 5 /HPF Final   11/26/2016 <1 0 - 5 /HPF Final   09/17/2011 3 /HPF Final   03/18/2011 <1 /HPF Final     Bacteria, UA   Date Value Ref Range Status   05/10/2022 Few (A) None Seen /HPF Final   06/22/2020 Many (A) None Seen /HPF Final   02/16/2020 Few (A) None Seen /HPF Final   11/26/2016 Many (A) None Seen /HPF Final   08/02/2013 FEW  Final   07/07/2013 MANY  Final   03/18/2011 RARE  Final        Imaging:  ECG 12 Lead    Result Date: 05/10/2022  SINUS RHYTHM WITH FREQUENT PREMATURE VENTRICULAR BEATS POSSIBLE LEFT ATRIAL ENLARGEMENT SEPTAL INFARCT  (CITED ON OR BEFORE 21-Sep-2020) ABNORMAL ECG WHEN COMPARED WITH ECG OF 24-Jan-2022 12:04, QUESTIONABLE CHANGE IN INITIAL FORCES OF ANTERIOR LEADS    XR Chest 2 views    Result Date: 05/10/2022  EXAM: XR CHEST 2 VIEWS DATE: 05/10/2022 6:40 PM ACCESSION: [id number] UN DICTATED: 05/10/2022 6:54 PM INTERPRETATION LOCATION: [facility]     CLINICAL INDICATION: 81 year old Female with DYSPNEA      TECHNIQUE: PA and Lateral Chest Radiographs.     COMPARISON: CXR 06/19/2020, CT chest 06/22/2020  FINDINGS: Similar chronic large soft tissue density with air-fluid levels projecting over the mid chest and right lung base consistent with known large hiatal hernia.     Indeterminate age patchy bibasilar opacities. Prominent interstitial opacities, unchanged. Chronic right superior paratracheal density consistent with mediastinal scarring seen on prior CT.     No demonstratable pleural effusion or pneumothorax.     Partially obscured cardiomediastinal silhouette. Partially imaged left shoulder arthroplasty. Aortic arch calcifications.             Unchanged chronic hiatal hernia with indeterminate age bibasilar opacities that may represent infection/aspiration or atelectasis/scarring.    CT Abdomen Pelvis W IV Contrast    Result Date: 05/10/2022  EXAM: CT ABDOMEN PELVIS W CONTRAST DATE: 05/10/2022 6:49 PM ACCESSION: [id number] UN DICTATED: 05/10/2022 7:17 PM INTERPRETATION LOCATION: [facility]     CLINICAL INDICATION: 81 years old with abdominal pain ; RLQ abdominal pain      COMPARISON: CT abdomen pelvis 06/22/2020     TECHNIQUE: A helical CT scan of the abdomen and pelvis was obtained following IV contrast from the lung bases through the pubic symphysis. Images were reconstructed in the axial plane. Coronal and sagittal reformatted images were also provided for further evaluation.     FINDINGS:     LOWER CHEST: Bibasilar atelectasis. Left upper lobe pneumatocele. Compression of the heart by the large hiatal hernia containing stomach and colon. Moderate coronary atherosclerotic calcifications.     LIVER: Normal liver contour.  No focal liver lesions.     BILIARY: The gallbladder is surgically absent. No biliary ductal dilatation.      SPLEEN: Normal in size and contour.     PANCREAS: Fatty infiltration of the pancreas. No focal lesions.  No ductal dilation.     ADRENAL GLANDS: Nodular thickening of the adrenal glands. Unchanged enhancement of the right adrenal gland.     KIDNEYS/URETERS: Mild right hydroureteronephrosis with urothelial enhancement. Mild left hydronephrosis. Nonobstructing stones in the right lower pole measuring up to 5 mm (3:52). Cortical thinning of the left upper pole, unchanged. No solid renal mass. Bilateral cysts and subcentimeter hypoattenuating lesions, too small to characterize.     BLADDER: Surgically absent. No enhancing tissue at the surgical bed.     REPRODUCTIVE ORGANS: Hysterectomy. No adnexal lesions.     GI TRACT: Large hiatal hernia containing stomach, large bowel, and pancreas, unchanged. Right lower quadrant ileoconduit. Normal appendix.     PERITONEUM, RETROPERITONEUM AND MESENTERY: No free air.  No ascites.  No fluid collection.     LYMPH NODES: Retroperitoneal lymph node dissection.     VESSELS: Hepatic and portal veins are patent.  Normal caliber aorta. Scattered calcified atherosclerosis of the abdominal aorta and its branching vessels.     BONES and SOFT TISSUES: Degenerative changes of the second lumbar spine with grade 1 anterolisthesis of L4 on L5. Numerous subcutaneous calcifications in the posterior abdominal wall soft tissues.         Right hydroureteronephrosis with urothelial enhancement suggestive of pyelitis and concerning for ascending urinary tract infection. Recommend correlation with urinalysis.     New left mild hydronephrosis.     Additional chronic and incidental findings, as detailed in the body of the report.

## 2022-05-11 NOTE — Unmapped (Signed)
Family Medicine Inpatient Service  History and Physical Note    Team: [facility] (pgr [phone])  PCP: Evette Cristal, MD  Date of Admission: May 10, 2022  Code Status: full code  Emergency Contact:   [name] (Daughter)  [phone] (Mobile)        ASSESSMENT / PLAN:   Tammy Braun is a 81 y.o. female with a past medical history significant for DM II, COPD, HTN, CKD II, T2N0M0 bladder cancer status post radical cystectomy with ileal conduit 10/2011  presents in setting of new onset right upper quadrant abdominal pain with mild epigastric pain, and brown sputum production. Patient will be admitted for acute complicated UTI requiring IV abx.    #Abdominal pain 2/2 acute complicated UTI- pyelitis    Patient presented w/ 1 day abdominal pain and brown sputum production. She was previously tx outpatient for UTI on 3/10 with Bactrim, sx resolved. On admission patient had leukocytosis to 18, afebrile, UA +LE,Nitrite concerning for UTI. CT A/P notable for right hydroureteronephrosis w/ urothelial enhancement suggestive of pyelitis and New left mild hydronephrosis. Urology was consulted in the ED given new left hydronephrosis and pyelitis of the right kidney. Urology did not suggest surgical intervention at this time but believe patient would benefit from IV abx.  Patient is tolerating PO well. Started Ceftriaxone , will continue with IV abx. Will narrow abx coverage when Ucx resolves.     -pending Ucx   - s/p CTX x1, will continue w/ ceftriaxone until urine cultures return to narrow covreage  - CBC daily       #Moderate hiatal hernia   CXR and CT was notable for large hiatal hernia which could also be contributing to abdominal pain and dark sputum production which has resolved. Given VSS and nrml Hgb with no other risk factors (no chronic NSAID use, PUD, blood thinner,etc.)  lower concern for upper GI bleed. Hgb stable. No further work up is indicated at this time.     #DM II  Last A1C 6.6 on 04/11/22.     -SSI       #COPD   Cont home meds.   - Spiriva   - albuterol prn     HTN   On Lasix and amlodipine 10mg , candesartan 8 mg   - hold Lasix and candesartan in the setting of pyelitis   - cont amlodipine 10mg      CKD II   Baseline Cr 0.9-1.01. Cr is at baseline     Moderate/Severe Tricuspid Regurgitation -   currently asymptomatic without evidence of volume overload.      Schizoaffective disorder  Prolixin 50 mg IM q30 days, due 5/12  - ordered Prolixin   - cont home mirtazapine     # Tobacco use disorder: 57.00 pack year smoking hx. Patient currently smoking 1 PPD. Will place inpatient consult to tobacco cessation for toxic effects of tobacco with hospital diagnosis tobacco use disorder and possible nicotine withdrawal.  - Tobacco cessation consult  - Nicotine replacement therapy with patch, gum, lozenge PRN    # FEN/GI:  - IVF None  - Check electrolytes as indicated, replete as needed.  - Diet Regular    # PPX:   - DVT: SQ Lovenox    # Dispo: Floor  [ ]  Anticipated Discharge Location: Home  [ ]  PT/OT/DME: PT/OT ordered  [ ]  CM/SW needs: CM to arrange home PT/OT  [ ]  Meds/Rx:  Not yet prescribed. No special med needs  [ ]   Teaching: None anticipated  [ ]  Follow up appt: Appointment needed  [ ]  Excuse letter: None anticipated  [ ]  Transport: Private Needed    This patient warrants inpatient care because of their severity of illness and the intensity of service that can only be performed in a hospital setting. This patient is expected to be hospitalized for greater than two midnights in the hospital because of the severity of their illness with pyletitis . (if patient is admitted as Obs status you can delete this sentence, if you cannot justify this sentence then consider making them obs status)    HISTORY OF PRESENT ILLNESS:  Tammy Braun is a 81 y.o. female with a past medical history of T2N0M0 bladder cancer status post neoadjuvant chemotherapy and radical cystectomy with ileal conduit on 10/05/2011, COPD, hypertension, degenerative disc disease, neuropathy who presents in the setting of a 1 day history of mild, waxing/waning epigastric/right upper quadrant pain, improved with eating, which began this morning after patient woke up.  Patient denies any inciting events.  Patient endorses history of constipation and generally requires MiraLAX daily to pass bowel movements.  Patient does not currently feel constipated; last bowel movement was 2 days ago.  Patient denies any dysuria, urgency, frequency.  Patient is accompanied by daughter who is active in her care endorses that patient has not had any urinary symptoms.  Patient reports that her stools have been slightly lighter than previous stools and denies any increased darkening of stools. In addition, patient also reports 1 day history of cough productive of brownish sputum.  Patient denies any shortness of breath, worsening cough, baseline oxygen requirement or increased mucus production.  Patient does currently smoke 1 pack/day and has a 57 pack-year history of smoking.  Patient denies any alcohol, substance use. Vital signs stable on arrival. On physical exam, stomal site is well-appearing, pain, protuberant, and productive of yellow urine.  Last known CT scan June 2021 with no evidence of disease, stable parastomal hernia and mild right hydroureter.  Patient denies any pain, nausea/vomiting currently.  Patient is not on any blood thinners.       ED Course: UA with large leukocyte esterase, positive nitrites, few bacteria.  Patient does have ileal conduit; unclear if true UTI.  Urinary culture pending. Lipase unremarkable.  CBC notable for white count 18. Ceftriaxone given x1.        COVID-19 Testing on Admission: Asymptomatic & Negative    COVID-19 Vaccine: Already completed    PAST MEDICAL / SURGICAL HX:  Past Medical History:   Diagnosis Date    Anxiety     Arthritis     Breast cyst     Cancer (CMS-HCC)     bladder    Depression, psychotic (CMS-HCC)     Diabetes mellitus (CMS-HCC)     in past    Hernia     History of transfusion     Hypertension     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/08/2015     Past Surgical History:   Procedure Laterality Date    ABDOMINAL SURGERY      BLADDER SURGERY      BREAST CYST EXCISION      CHEMOTHERAPY  2012    bladder    GALLBLADDER SURGERY      stone removal    ILEOSTOMY  2012    PR COLONOSCOPY FLX DX W/COLLJ SPEC WHEN PFRMD N/A 02/09/2015    Procedure: COLONOSCOPY, FLEXIBLE, PROXIMAL TO SPLENIC FLEXURE; DIAGNOSTIC, W/WO COLLECTION SPECIMEN BY BRUSH OR WASH;  Surgeon: Dewaine Conger, MD;  Location: [facility];  Service: Gastroenterology    PR RECONSTR TOTAL SHOULDER IMPLANT Left 04/08/2018    Procedure: ARTHROPLASTY, GLENOHUMERAL JOINT; TOTAL SHOULDER(GLENOID & PROXIMAL HUMERAL REPLACEMENT(EG, TOTAL SHOULDER);  Surgeon: Tomasa Rand, MD;  Location: [facility] OR [facility];  Service: Ortho Sports Medicine    PR SIGMOIDOSCOPY,BIOPSY N/A 03/11/2015    Procedure: SIGMOIDOSCOPY, FLEXIBLE; WITH BIOPSY, SINGLE OR MULTIPLE;  Surgeon: Wilburt Finlay, MD;  [facility];  Service: Gastroenterology       FAMILY HX:   Family History   Problem Relation Age of Onset    Breast cancer Daughter 40    Diabetes Mother     Glaucoma Father     Colon cancer Neg Hx     Ovarian cancer Neg Hx     Endometrial cancer Neg Hx     Anesthesia problems Neg Hx     Bleeding Disorder Neg Hx        SOCIAL HX:   Social History     Socioeconomic History    Marital status: Widowed   Tobacco Use    Smoking status: Every Day     Packs/day: 1.00     Years: 57.00     Pack years: 57.00     Types: Cigarettes    Smokeless tobacco: Never   Vaping Use    Vaping Use: Never used   Substance and Sexual Activity    Alcohol use: No     Alcohol/week: 0.0 standard drinks    Drug use: No    Sexual activity: Not Currently       MEDICATIONS / ALLERGIES:  (Not in a hospital admission)      Allergies   Allergen Reactions    Lisinopril Anaphylaxis and Swelling Losartan Dizziness    Hctz [Hydrochlorothiazide]      SIADH       IMMUNIZATIONS:   Immunization History   Administered Date(s) Administered    COVID-19 VACC,MRNA,(PFIZER)(PF) 08/31/2020, 09/21/2020    PNEUMOCOCCAL POLYSACCHARIDE 23 06/04/2017       REVIEW OF SYSTEMS:  Pertinent positives and negatives per HPI. A complete review of systems otherwise negative.    PHYSICAL EXAM:    Initial ED Vitals:   ED Triage Vitals   Enc Vitals Group      BP 05/10/22 1359 135/81      Heart Rate 05/10/22 1346 79      SpO2 Pulse --       Resp 05/10/22 1346 16      Temp 05/10/22 1359 37.3 ??C (99.1 ??F)      Temp Source 05/10/22 1359 Oral      SpO2 05/10/22 1346 97 %      Weight 05/10/22 1359 57.2 kg (126 lb 1.7 oz)      Height 05/10/22 1359 1.524 m (5')      Head Circumference --       Peak Flow --       Pain Score --       Pain Loc --       Pain Edu? --       Excl. in GC? --        Recent Vitals:  Vitals:    05/10/22 1724   BP: 165/79   Pulse: 88   Resp: 20   Temp: 37.3 ??C (99.2 ??F)   SpO2: 95%       GEN: Well-appearing, lying in bed, NAD   Eyes: PERRL.  No scleral icterus. Conjunctiva non-erythematous. EOMI.  HEENT: NCAT, MMM. Oropharynx clear.  Neck: Supple.  Lymphadenopathy: No cervical or supraclavicular LAD.  CV: Regular rate and rhythm. No murmurs/rubs/gallops. No costochondral tenderness. No cyanosis or clubbing. Cap Refill < 2 secs  Pulm: CTAB. No wheezing, crackles, or rhonchi.  Abd: Flat.  Tender LQ abdominal pain. No guarding, rebound.  Normoactive bowel sounds. Incision sites are clean, dry, intact. Stoma is prolapsed,  with clear urine in bag.   Neuro: A&O x 3. No focal deficits. Strength 5/5 UE/LE. Distal sensation to light touch intact.  Ext: No peripheral edema.  Palpable distal pulses.  Skin: No rashes or skin lesions.        LABS/ STUDIES:  All imaging, laboratory studies, and other pertinent tests including electrocardiography were reviewed prior to admission and are summarized within the assessment and plan. Rivka Spring, MD PGY1  May 10, 2022 9:57 PM

## 2022-05-12 LAB — CBC
HEMATOCRIT: 35.1 % (ref 34.0–44.0)
HEMOGLOBIN: 11.6 g/dL (ref 11.3–14.9)
MEAN CORPUSCULAR HEMOGLOBIN CONC: 33 g/dL (ref 32.0–36.0)
MEAN CORPUSCULAR HEMOGLOBIN: 27.7 pg (ref 25.9–32.4)
MEAN CORPUSCULAR VOLUME: 84 fL (ref 77.6–95.7)
MEAN PLATELET VOLUME: 8.1 fL (ref 6.8–10.7)
PLATELET COUNT: 295 10*9/L (ref 150–450)
RED BLOOD CELL COUNT: 4.18 10*12/L (ref 3.95–5.13)
RED CELL DISTRIBUTION WIDTH: 17.8 % — ABNORMAL HIGH (ref 12.2–15.2)
WBC ADJUSTED: 8.1 10*9/L (ref 3.6–11.2)

## 2022-05-12 LAB — BASIC METABOLIC PANEL
ANION GAP: 10 mmol/L (ref 5–14)
BLOOD UREA NITROGEN: 22 mg/dL (ref 9–23)
BUN / CREAT RATIO: 20
CALCIUM: 9.4 mg/dL (ref 8.7–10.4)
CHLORIDE: 106 mmol/L (ref 98–107)
CO2: 22.5 mmol/L (ref 20.0–31.0)
CREATININE: 1.08 mg/dL — ABNORMAL HIGH
EGFR CKD-EPI (2021) FEMALE: 52 mL/min/{1.73_m2} — ABNORMAL LOW (ref >=60)
GLUCOSE RANDOM: 144 mg/dL (ref 70–179)
POTASSIUM: 4.3 mmol/L (ref 3.4–4.8)
SODIUM: 138 mmol/L (ref 135–145)

## 2022-05-12 LAB — MAGNESIUM: MAGNESIUM: 1.8 mg/dL (ref 1.6–2.6)

## 2022-05-12 LAB — PHOSPHORUS: PHOSPHORUS: 3.9 mg/dL (ref 2.4–5.1)

## 2022-05-12 MED ORDER — SENNOSIDES 8.6 MG TABLET
ORAL_TABLET | Freq: Every evening | ORAL | 0 refills | 30 days | Status: CP | PRN
Start: 2022-05-12 — End: ?

## 2022-05-12 MED ORDER — FUROSEMIDE 20 MG TABLET
ORAL_TABLET | 0 refills | 0 days | Status: CP
Start: 2022-05-12 — End: ?

## 2022-05-12 MED ADMIN — bisacodyL (DULCOLAX) suppository 10 mg: 10 mg | RECTAL | @ 10:00:00 | Stop: 2022-05-12

## 2022-05-12 MED ADMIN — furosemide (LASIX) tablet 40 mg: 40 mg | ORAL | @ 14:00:00

## 2022-05-12 MED ADMIN — furosemide (LASIX) injection 20 mg: 20 mg | INTRAVENOUS | @ 03:00:00 | Stop: 2022-05-11

## 2022-05-12 MED ADMIN — iohexoL (OMNIPAQUE) 350 mg iodine/mL solution 75 mL: 75 mL | INTRAVENOUS | @ 01:00:00 | Stop: 2022-05-11

## 2022-05-12 MED ADMIN — cefTRIAXone (ROCEPHIN) 1 g in sodium chloride 0.9 % (NS) 100 mL IVPB-connector bag: 1 g | INTRAVENOUS | @ 13:00:00 | Stop: 2022-05-12

## 2022-05-12 MED ADMIN — cyclobenzaprine (FLEXERIL) tablet 5 mg: 5 mg | ORAL | @ 03:00:00

## 2022-05-12 MED ADMIN — senna (SENOKOT) tablet 2 tablet: 2 | ORAL | @ 16:00:00 | Stop: 2022-05-12

## 2022-05-12 MED ADMIN — polyethylene glycol (MIRALAX) packet 17 g: 17 g | ORAL | @ 13:00:00 | Stop: 2022-05-12

## 2022-05-12 MED ADMIN — magnesium hydroxide (MILK OF MAGNESIA) oral suspension: 30 mL | ORAL | @ 21:00:00

## 2022-05-12 MED ADMIN — enoxaparin (LOVENOX) syringe 40 mg: 40 mg | SUBCUTANEOUS | @ 13:00:00

## 2022-05-12 MED ADMIN — nicotine (NICODERM CQ) 21 mg/24 hr patch 1 patch: 1 | TRANSDERMAL | @ 13:00:00

## 2022-05-12 MED ADMIN — insulin lispro (HumaLOG) injection 0-20 Units: 0-20 [IU] | SUBCUTANEOUS | @ 13:00:00

## 2022-05-12 MED ADMIN — umeclidinium (INCRUSE ELLIPTA) 62.5 mcg/actuation inhaler 1 puff: 1 | RESPIRATORY_TRACT | @ 13:00:00

## 2022-05-12 MED ADMIN — senna (SENOKOT) tablet 2 tablet: 2 | ORAL | @ 03:00:00

## 2022-05-12 NOTE — Unmapped (Signed)
Problem: Adult Inpatient Plan of Care  Goal: Plan of Care Review  05/12/2022 1301 by Aron Baba, RN  Outcome: Resolved  05/12/2022 1248 by Aron Baba, RN  Outcome: Progressing  Goal: Patient-Specific Goal (Individualized)  05/12/2022 1301 by Aron Baba, RN  Outcome: Resolved  05/12/2022 1248 by Aron Baba, RN  Outcome: Progressing  Goal: Absence of Hospital-Acquired Illness or Injury  05/12/2022 1301 by Aron Baba, RN  Outcome: Resolved  05/12/2022 1248 by Aron Baba, RN  Outcome: Progressing  Intervention: Identify and Manage Fall Risk  Recent Flowsheet Documentation  Taken 05/12/2022 0800 by Aron Baba, RN  Safety Interventions:   low bed   fall reduction program maintained  Intervention: Prevent and Manage VTE (Venous Thromboembolism) Risk  Recent Flowsheet Documentation  Taken 05/12/2022 0800 by Aron Baba, RN  Activity Management: activity adjusted per tolerance  Goal: Optimal Comfort and Wellbeing  05/12/2022 1301 by Aron Baba, RN  Outcome: Resolved  05/12/2022 1248 by Aron Baba, RN  Outcome: Progressing  Goal: Readiness for Transition of Care  05/12/2022 1301 by Aron Baba, RN  Outcome: Resolved  05/12/2022 1248 by Aron Baba, RN  Outcome: Progressing  Goal: Rounds/Family Conference  05/12/2022 1301 by Aron Baba, RN  Outcome: Resolved  05/12/2022 1248 by Aron Baba, RN  Outcome: Progressing     Problem: Self-Care Deficit  Goal: Improved Ability to Complete Activities of Daily Living  05/12/2022 1301 by Aron Baba, RN  Outcome: Resolved  05/12/2022 1248 by Aron Baba, RN  Outcome: Progressing     Problem: Impaired Wound Healing  Goal: Optimal Wound Healing  05/12/2022 1301 by Aron Baba, RN  Outcome: Resolved  05/12/2022 1248 by Aron Baba, RN  Outcome: Progressing  Intervention: Promote Wound Healing  Recent Flowsheet Documentation  Taken 05/12/2022 0800 by Aron Baba, RN  Activity Management: activity adjusted per tolerance

## 2022-05-12 NOTE — Unmapped (Signed)
OCCUPATIONAL THERAPY  Evaluation (05/11/22 1501)    Patient Name:  Tammy Braun       Medical Record Number: [id number]   Date of Birth: Aug 12, 1941  Sex: Female            OT Treatment Diagnosis:  Decreased (I) with ADLs and functional mobility    Assessment  Problem List: Pain, Decreased range of motion, Decreased strength, Decreased safety awareness, Impaired balance, Impaired ADLs, Fall risk, Decreased mobility, Decreased endurance              Clinical Decision Making: Low  Assessment: Tammy Braun is a 81 y.o. female with a past medical history significant for DM II, COPD, HTN, CKD II, T2N0M0 bladder cancer status post radical cystectomy with ileal conduit 10/2011 presents in setting of new onset right upper quadrant abdominal pain with mild epigastric pain, and brown sputum production. Patient will be admitted for acute complicated UTI requiring IV abx.  Patient presents to OT with below baseline functional activities impacting pt's independence with ADLs and functional mobility/transfers compared to baseline. Patient educated on purpose and goal of OT. Patient completing sit to stand from recliner with mod assist, performed functional ambualtion ~ 20 ft x 2 with use of RW, performed functional t/f to commode witih minimal assist with vc/tc for hand placement, performed clothing management with mod assist, pt performed hand washing task with minimal assist with thoroughness with task this date. Pt exhibiting easy fatigue, decreased safety awareness, decreased activity tolerance. After review of the patient's occupational profile and history, assessment of occupational performance, clinical decision making, and development of POC, the patient presents as a low complexity case. Based on the daily activity AM-PAC raw score of 14/24, the patient is considered to be 59.67% impaired with self care. Recommend post acute 3x (daughter assists pt with all ADLs and (I) ADLs at home. DME: none  Today's Interventions: ADL retraining, Balance activities, Bed mobility, Compensatory tech. training, Conservation, Education - Patient, Education - Family / caregiver, Endurance activities, Functional mobility, Field seismologist education, Teacher, early years/pre, Insurance account manager / Proximal stability       Activity Tolerance During [employer]  Limited by fatigue    Plan  Planned Frequency of Treatment:  1-2x per day for: 3-4x week       Planned Interventions:  Adaptive equipment, ADL retraining, Balance activities, Bed mobility, Compensatory tech. training, Conservation, Education - Patient, Home exercise program, Functional mobility, Endurance activities, Education - Family / caregiver, Insurance account manager / Proximal stability, Safety education, Positioning, Therapeutic exercise, Transfer training, UE Strength / coordination exercise    Post-Discharge Occupational Therapy Recommendations:   3x weekly   OT DME Recommendations: None -        GOALS:   Patient and Family Goals: to be able to get around and not hurt so much    IP Long Term Goal #1: Pt to perform self care routine with supervision       Short Term:  SHORT GOAL #1: Pt to perform full body dressing with minimal assis with use of AE as needed   Time Frame : 2 weeks  SHORT GOAL #2: Pt to perform oral care/grooming with cga   Time Frame : 2 weeks  SHORT GOAL #3: Pt to perform functional t/f to commode with cga with use of LRAD, cga with clothing managment and toileting   Time Frame : 2 weeks                   Prognosis:  Good  Positive Indicators:  supportive daughter, good PLOF, motivated  Barriers to Discharge: Functional strength deficits, Decreased range of motion, Inability to safely perform ADLS, Decreased safety awareness, Endurance deficits, Impaired Balance    Subjective  Current Status pt received/left seated in recliner, daughter at bedside, call bell within reach  Prior Functional Status Pt reports she lives with her daugther, she stated that she is mod assist with ADLs, her daughter cooks, cleans, provides transportation and does medication management.  Pt reports that she ambulated with rollator in house and ambulates witih a cane in community.  Endorses only 1 fall in the past 3 months.    Medical Tests / Procedures: Reviewed in EPIC  Services patient receives: OT, PT    Patient / Caregiver reports: pt and RN agreeable    Past Medical History:   Diagnosis Date    Anxiety     Arthritis     Breast cyst     Cancer (CMS-HCC)     bladder    Depression, psychotic (CMS-HCC)     Diabetes mellitus (CMS-HCC)     in past    Hernia     History of transfusion     Hypertension     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/08/2015    Social History     Tobacco Use    Smoking status: Every Day     Packs/day: 0.75     Years: 57.00     Pack years: 42.75     Types: Cigarettes    Smokeless tobacco: Never    Tobacco comments:     15cpd   Substance Use Topics    Alcohol use: No     Alcohol/week: 0.0 standard drinks      Past Surgical History:   Procedure Laterality Date    ABDOMINAL SURGERY      BLADDER SURGERY      BREAST CYST EXCISION      CHEMOTHERAPY  2012    bladder    GALLBLADDER SURGERY      stone removal    ILEOSTOMY  2012    PR COLONOSCOPY FLX DX W/COLLJ SPEC WHEN PFRMD N/A 02/09/2015    Procedure: COLONOSCOPY, FLEXIBLE, PROXIMAL TO SPLENIC FLEXURE; DIAGNOSTIC, W/WO COLLECTION SPECIMEN BY BRUSH OR WASH;  Surgeon: Dewaine Conger, MD;  Location: [facility] PROCEDURES [place];  Service: Gastroenterology    PR RECONSTR TOTAL SHOULDER IMPLANT Left 04/08/2018    Procedure: ARTHROPLASTY, GLENOHUMERAL JOINT; TOTAL SHOULDER(GLENOID & PROXIMAL HUMERAL REPLACEMENT(EG, TOTAL SHOULDER);  Surgeon: Tomasa Rand, MD;  Location[facility] OR [facility];  Service: Ortho Sports Medicine    PR SIGMOIDOSCOPY,BIOPSY N/A 03/11/2015    Procedure: SIGMOIDOSCOPY, FLEXIBLE; WITH BIOPSY, SINGLE OR MULTIPLE;  Surgeon: Wilburt Finlay, MD;  [facility]: [facility];  Service: Gastroenterology    Family History   Problem Relation Age of Onset    Breast cancer Daughter 72    Diabetes Mother     Glaucoma Father     Colon cancer Neg Hx     Ovarian cancer Neg Hx     Endometrial cancer Neg Hx     Anesthesia problems Neg Hx     Bleeding Disorder Neg Hx         Lisinopril, Losartan, and Hctz [hydrochlorothiazide]     Objective Findings  Precautions / Restrictions  Falls precautions    Weight Bearing  Non-applicable    Required Braces or Orthoses  Non-applicable    Communication Preference  Verbal  Pain  pt with c/o R LQ pain with deep breath    Equipment / Environment  Urostomy, Vascular access (PIV, TLC, Port-a-cath, PICC), Caregiver not wearing mask for full session, Patient not wearing mask for full session, Supplemental oxygen    Living Situation  Living Environment: House  Lives With: Daughter  Home Living: One level home, Tub/shower unit, Standard height toilet, Stairs to enter with rails, Hand-held shower hose, Accessible via walker, Shower chair with back  Rail placement (outside): Bilateral rails  Number of Stairs to Enter (outside): 4  Equipment available at home: Rolling walker, Straight cane, Wheelchair-manual, Lift-recliner, Paediatric nurse with back     Cognition   Orientation Level:  Disoriented to time   Arousal/Alertness:  Appropriate responses to stimuli   Attention Span:  Appears intact   Memory:  Decreased short term memory   Following Commands:  Follows multistep commands with increased time   Safety Judgment:  Decreased awareness of need for assistance, Decreased awareness of need for safety   Awareness of Errors:  Assistance required to identify errors made, Assistance required to correct errors made   Problem Solving:  Assistance required to identify errors made, Assistance required to generate solutions   Comments: pt with slow processing    Vision / Hearing   Vision: Wears glasses all the time  Vision Comments: n/a  Hearing: No deficit identified   Hearing: n/a     Hand Function:  Right Hand Function: Right hand function impaired  Right Hand Impairment: grip strength fair  Left Hand Function: Left hand function impaired  Left Hand Impairment: grip strength fair  Hand Function comments: pt with arthritis in B hands  Hand Dominance: Right    Skin Inspection:  Skin Inspection: Intact where visualized  Skin Inspection comment: n/a    ROM / Strength:  UE ROM/Strength: Left WFL, Right Impaired/Limited  RUE Impairment: Limited AROM, Limited PROM, Reduced strength  UE ROM/ Strength Comment: pt with R shoulder flexion ~ 90*  LE ROM/Strength: Left Impaired/Limited, Right Impaired/Limited  RLE Impairment: Limited AROM, Reduced strength  LLE Impairment: Limited AROM, Reduced strength  LE ROM/ Strength Comment: pt with arthritis in B hips and knees per pt report    Coordination:  Coordination: WFL  Coordination comment: n/a    Sensation:  RUE Sensation: RUE intact  LUE Sensation: LUE intact  RLE Sensation: RLE intact  LLE Sensation: LLE intact  Sensory/ Proprioception/ Stereognosis comments: SILT    Balance:  sitting balance is fair+, standing balane is fair- standing at [employer] level    Functional Mobility  Transfer Assistance Needed: Yes  Transfers - Needs Assistance: Min assist (for functional t/f to commode)  Bed Mobility Assistance Needed: Yes (NT, anticipate mod assist)  Ambulation: pt ambulated ~ 20 ft x 2 with use of RW      ADLs  ADLs: Needs assistance with ADLs  ADLs - Needs Assistance: Grooming, Toileting, UB dressing, LB dressing  Grooming - Needs Assistance: Min assist (to thoroughly wash hands, noted arthritis to B hand)  Toileting - Needs Assistance: Requires additional structure, Mod assist  UB Dressing - Needs Assistance: Min assist, Requires additional structure  LB Dressing - Needs Assistance: Requires additional structure, Max assist  IADLs: NT      Vitals / Orthostatics  At Rest: NAD  With Activity: NAD  Vitals/Orthostatics: pt with c/o dizziness while seated on commode, pt      Medical Staff Made Aware: Lelon Mast RN      Occupational Therapy  Session Duration  OT Individual [mins]: 35         I attest that I have reviewed the above information.  Signed: Ricky Stabs, OT  Filed 05/11/2022

## 2022-05-12 NOTE — Unmapped (Signed)
ULTRASOUND PIV PROCEDURE NOTE    Indications:   Poor venous access.    Ultrasound guidance was necessary to obtain access.     Procedure Details:  Identity of the patient was confirmed via name, medical record number and date of birth. The availability of the correct equipment was verified.    The vein was identified and measured for ultrasound catheter insertion.       Vein measurement (without tourniquet):   0.3 cm   A(n) 22g x 1 inch catheter was selected based on the recommendations below:    [facility] Catheter/Vein Ratio Guidelines    Chart to determine PIV catheter size/length to use based on vein diameter and depth   Catheter Gauge Size (g)  22g 20g 18g   Catheter length (inches)  1.75 1.75 1.75   Catheter diameter measurement (mm) 0.9 mm 1.1 mm 1.3 mm          Minimum required vein diameter       Sonosite (cm)  0.27 cm 0.33 cm 0.39 cm          Maximum vein depth  1.25 cm 1.25 cm 1.25 cm            The field was prepared with necessary supplies and equipment.  Probe cover and sterile gel utilized. Insertion site was prepped with chlorhexidine and allowed to dry.  The catheter extension was primed with normal saline. The [place] PIV was placed in the right [facility] with 3 attempt(s).     Catheter aspirated, 3 mL blood return present. The catheter was then flushed with 20 mL of normal saline. Insertion site cleansed, and dressing applied per manufacturer guidelines. The catheter was inserted with difficulty due to poor vasculature.    Thank you,     Loreta Ave RN    Ultrasound Resource Nurse    Workup / Procedure Time:  60 minutes

## 2022-05-12 NOTE — Unmapped (Signed)
Problem: Adult Inpatient Plan of Care  Goal: Plan of Care Review  Outcome: Progressing  Goal: Patient-Specific Goal (Individualized)  Outcome: Progressing  Goal: Absence of Hospital-Acquired Illness or Injury  Outcome: Progressing  Goal: Optimal Comfort and Wellbeing  Outcome: Progressing  Goal: Readiness for Transition of Care  Outcome: Progressing  Goal: Rounds/Family Conference  Outcome: Progressing     Problem: Self-Care Deficit  Goal: Improved Ability to Complete Activities of Daily Living  Outcome: Progressing     Problem: Impaired Wound Healing  Goal: Optimal Wound Healing  Outcome: Progressing

## 2022-05-12 NOTE — Unmapped (Cosign Needed)
Family Medicine Inpatient Service    Progress Note    Team: [facility] ([phone])    Hospital Day: 2    ASSESSMENT / PLAN:   Tammy Braun is a 81 y.o. female witha past medical history significant for DM II, COPD, HTN, CKD II, T2N0M0 bladder cancer status post radical cystectomy with ileal conduit 10/2011  presents in setting of new onset right upper quadrant abdominal pain with mild epigastric pain, and brown sputum production. Patient will be admitted for acute complicated UTI requiring IV abx.     # E. Coli Pyelitis  Patient is s/p radical cystectomy with ileal conduit 10/2011. She was previously tx outpatient for UTI on 3/10 with Bactrim, sx resolved. On admission patient had leukocytosis to 18, afebrile, UA +LE, Nitrite concerning for UTI. CT A/P notable for right hydroureteronephrosis w/ urothelial enhancement suggestive of pyelitis and New left mild hydronephrosis. Urology was consulted in the ED given new left hydronephrosis and pyelitis of the right kidney. Urology did not suggest surgical intervention at this time but believe patient would benefit from IV abx.    - Transitioned to PO cipro for 4 more days  - Trend WBC     # Brown Sputum Production  No PNA seen on CXR. If patient has prolonged O2 requirement, will proceed with CTA to rule out PE.    # Moderate hiatal hernia   CXR and CT was notable for large hiatal hernia which could also be contributing to abdominal pain and dark sputum production which has resolved. Given VSS and nrml Hgb with no other risk factors (no chronic NSAID use, PUD, blood thinner,etc.) lower concern for upper GI bleed. Hgb stable. No further work up is indicated at this time.      # DM II  Last A1C 6.6 on 04/11/22.   -SSI     # COPD   Cont home meds.   - Spiriva   - albuterol prn     # HTN   On Lasix and amlodipine 10mg , candesartan 8 mg   - cont amlodipine 10mg    - Initial Cr stable, will follow up BMP and restart other home meds if appropriate     # CKD II Baseline Cr 0.9-1.01. Cr is at baseline      # Moderate/Severe Tricuspid Regurgitation   - currently asymptomatic without evidence of volume overload.       # Schizoaffective disorder  Prolixin 50 mg IM q30 days, due 5/12  - ordered Prolixin   - cont home mirtazapine      # Tobacco use disorder: 57.00 pack year smoking hx. Patient currently smoking 1 PPD. Will place inpatient consult to tobacco cessation for toxic effects of tobacco with hospital diagnosis tobacco use disorder and possible nicotine withdrawal.  - Tobacco cessation consult  - Nicotine replacement therapy with patch, gum, lozenge PRN      # Checklist:  - IVF None  - Tubes/Lines/Drains: PIV  - Diet Regular  - Bowel Regimen: Miralax 17 g daily and Senna 2 tabs qHS  - DVT: SQ Lovenox  - Code Status:   Orders Placed This Encounter   Procedures    Full Code     Standing Status:   Standing     Number of Occurrences:   1     - Dispo: Floor    [ ]  Anticipated Discharge Location: Home  [ ]  PT/OT/DME: No needs anticipated  [ ]  CM/SW needs: None anticipated  [ ]   Meds/Rx:  Not yet prescribed. No special med needs  [ ]  Teaching: None anticipated  [ ]  Follow up appt: Appointment needed  [ ]  Excuse letter: None anticipated  [ ]  Transport: Private Needed    SUBJECTIVE:  Interval events: Feeling well this morning. Continues to have some abdominal pain but up and OOB, sitting in chair eating.       REVIEW OF SYSTEMS:  Pertinent positives and negatives per HPI. A complete review of systems otherwise negative.    PHYSICAL EXAM:      Intake/Output Summary (Last 24 hours) at 05/12/2022 1615  Last data filed at 05/12/2022 1500  Gross per 24 hour   Intake 700 ml   Output 1500 ml   Net -800 ml         Recent Vitals:  Vitals:    05/12/22 0800   BP: 146/84   Pulse: 82   Resp: 18   Temp: 36.8 ??C (98.3 ??F)   SpO2: 91%       GEN: well appearing, sitting in chair eating, NAD   HEENT: NCAT, No scleral icterus. Conjunctiva non-erythematous. MMM.  CV: Regular rate and rhythm. No murmurs/rubs/gallops.  Pulm: Normal work of breathing on RA. CTAB. No wheezing, crackles, or rhonchi.  Abd: Flat. Non-tender. No guarding, rebound. R sided CVA tenderness  Neuro: A&O x 3. No focal deficits.  Ext: No peripheral edema.  Palpable distal pulses.  Skin: No rashes or skin lesions.     LABS/ STUDIES:    All imaging, laboratory studies, and other pertinent tests including electrocardiography within the last 24 hours were reviewed and are summarized within the assessment and plan.     NUTRITION:                         Angelita Ingles, MD PGY1  May 11, 2022 12:18 PM

## 2022-05-12 NOTE — Unmapped (Signed)
Patient sleeping between care. Aox3. Denies pain. LBM 5/9. Pt requesting suppository. Productive cough with pink tinged sputum noted. No oxygen requirement needed overnight. Plan of care reviewed with patient and daughter. Call bell within reach.        Problem: Adult Inpatient Plan of Care  Goal: Plan of Care Review  Outcome: Ongoing - Unchanged     Problem: Adult Inpatient Plan of Care  Goal: Patient-Specific Goal (Individualized)  Outcome: Ongoing - Unchanged     Problem: Adult Inpatient Plan of Care  Goal: Optimal Comfort and Wellbeing  Outcome: Ongoing - Unchanged     Problem: Adult Inpatient Plan of Care  Goal: Readiness for Transition of Care  Outcome: Ongoing - Unchanged     Problem: Adult Inpatient Plan of Care  Goal: Rounds/Family Conference  Outcome: Ongoing - Unchanged     Problem: Self-Care Deficit  Goal: Improved Ability to Complete Activities of Daily Living  Outcome: Ongoing - Unchanged

## 2022-05-12 NOTE — Unmapped (Incomplete Revision)
Physician Discharge Summary [facility]  3 BT1 [facility]  [name]  [place] [address]  Dept: [phone]  Loc: [phone]     Identifying Information:   Tammy Braun  08-05-1941  [id number]    Primary Care Physician: Evette Cristal, MD   Code Status: Full Code    Admit Date: 05/10/2022    Discharge Date: 05/12/2022     Discharge To: Home with Home Health and/or PT/OT    Discharge Service: [facility] - [facility]     Discharge Attending Physician: No att. 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 *      Outpatient Provider Follow Up Issues:   [ ]  Renal U/S prior to Urology Appt  [ ]  Labs and adjust lasix dose    Hospital Course:   Eretria Braun is a 81 y.o. female with a past medical history significant for DM II, COPD, HTN, CKD II, T2N0M0 bladder cancer status post radical cystectomy with ileal conduit 10/2011 who presents in setting of new onset right upper quadrant abdominal pain with mild epigastric pain, and brown sputum production. Patient will be admitted for acute complicated UTI requiring IV abx.     # E. Coli Pyelitis  Patient is s/p radical cystectomy with ileal conduit 10/2011. She was previously tx outpatient for UTI on 3/10 with Bactrim, sx resolved. On admission patient had leukocytosis to 18, afebrile, UA +LE, Nitrite concerning for UTI. CT A/P notable for right hydroureteronephrosis w/ urothelial enhancement suggestive of pyelitis and New left mild hydronephrosis. Urology was consulted in the ED given new left hydronephrosis and pyelitis of the right kidney. Urology did not suggest surgical intervention at this time but believe patient would benefit from IV abx. Patient received 3 doses of CTX and was then transitioned to ciprofloxacin of which she will take for 4 more days. She will need urology follow-up in about 6-8 weeks with a renal U/S prior to that.      # Brown Sputum Production - New O2 Req - Pulm. Hypertension: Patient had multiples episodes of brown and then pink tinged sputum production iso new O2 requirement c/f PE. Pt received a CTA that was negative for PE, but did show enlargement of the right cardiac chambers and main pulmonary artery suggestive of pulmonary arterial hypertension. She has a known history of pulm hypertension. She received a dose of IV lasix and was able to wean off of oxygen by the morning. We will increase her home lasix to 40 daily for 5 days to ensure euvolemia and have close PCP fu.     No admission procedures for hospital encounter.  ______________________________________________________________________  Discharge Medications:     Your Medication List        START taking these medications      ciprofloxacin HCl 500 MG tablet  Commonly known as: CIPRO  Take 1 tablet (500 mg total) by mouth Two (2) times a day for 4 days.  Start taking on: May 13, 2022     senna 8.6 mg tablet  Commonly known as: SENOKOT  Take 2 tablets by mouth nightly as needed for constipation.            CHANGE how you take these medications      furosemide 20 MG tablet  Commonly known as: LASIX  Take 40 mg lasix (2 tablets) daily for 5 days then return to 20 mg daily  What changed:   how much to take  how to take this  when to take this  additional instructions     tiotropium 18 mcg inhalation capsule  Commonly known as: SPIRIVA WITH HANDIHALER  Use once daily  What changed:   how much to take  how to take this  when to take this  reasons to take this  additional instructions            CONTINUE taking these medications      acetaminophen 500 MG tablet  Commonly known as: TYLENOL  Take 1 tablet (500 mg total) by mouth every six (6) hours as needed for pain.     albuterol 90 mcg/actuation inhaler  Commonly known as: PROVENTIL HFA;VENTOLIN HFA  Inhale 2 puffs every four (4) hours as needed for wheezing.     blood sugar diagnostic Strp  Commonly known as: ACCU-CHEK AVIVA PLUS TEST STRP  by Other route daily at 10am.     blood-glucose meter kit  Use to check blood sugar four times daily or as directed by MD. Dx: Diabetes, E08.00 *DISPENSE ACCU-CHEK AVIVA PLUS BRAND*     candesartan 8 MG tablet  Commonly known as: ATACAND  Take 0.5 tablets (4 mg total) by mouth daily.     cyclobenzaprine 5 MG tablet  Commonly known as: FLEXERIL  Take 1 tablet (5 mg total) by mouth nightly.     DULoxetine 20 MG capsule  Commonly known as: CYMBALTA  Take 1 capsule (20 mg total) by mouth daily.     fluPHENAZine decanoate 25 mg/mL injection  Commonly known as: PROLIXIN  Inject 2 mL (50 mg total) into the muscle every thirty (30) days.     gabapentin 100 MG capsule  Commonly known as: NEURONTIN  1-2 pills tid prn pain and numbness     inhalational spacing device Spcr  Commonly known as: E-Z SPACER  1 each by Miscellaneous route every four (4) hours as needed.     lancets Misc  Commonly known as: ACCU-CHEK SOFTCLIX LANCETS  1 each by Miscellaneous route daily at 10am.     MEDICAL SUPPLY ITEM  Entreal formula nutritionally supplement complete caloric density     mirtazapine 7.5 MG tablet  Commonly known as: REMERON  Take 1 tablet (7.5 mg total) by mouth nightly.     polyethylene glycol 17 gram packet  Commonly known as: MIRALAX  Take 17 g by mouth daily.              Allergies:  Lisinopril, Losartan, and Hctz [hydrochlorothiazide]  ______________________________________________________________________  Pending Test Results (if blank, then none):      Most Recent Labs:  All lab results last 24 hours -   Recent Results (from the past 24 hour(s))   POCT Glucose    Collection Time: 05/11/22  5:39 PM   Result Value Ref Range    Glucose, POC 158 70 - 179 mg/dL   POCT Glucose    Collection Time: 05/11/22  9:31 PM   Result Value Ref Range    Glucose,  POC 148 70 - 179 mg/dL   CBC    Collection Time: 05/12/22  7:39 AM   Result Value Ref Range    WBC 8.1 3.6 - 11.2 10*9/L    RBC 4.18 3.95 - 5.13 10*12/L    HGB 11.6 11.3 - 14.9 g/dL    HCT 16.1 09.6 - 04.5 %    MCV 84.0 77.6 - 95.7 fL    MCH 27.7 25.9 - 32.4 pg    MCHC 33.0 32.0 - 36.0 g/dL    RDW 40.9 (H) 81.1 - 15.2 %    MPV 8.1 6.8 - 10.7 fL    Platelet 295 150 - 450 10*9/L   Phosphorus Level    Collection Time: 05/12/22  7:39 AM   Result Value Ref Range    Phosphorus 3.9 2.4 - 5.1 mg/dL   Magnesium Level    Collection Time: 05/12/22  7:39 AM   Result Value Ref Range    Magnesium 1.8 1.6 - 2.6 mg/dL   Basic Metabolic Panel    Collection Time: 05/12/22  7:39 AM   Result Value Ref Range    Sodium 138 135 - 145 mmol/L    Potassium 4.3 3.4 - 4.8 mmol/L    Chloride 106 98 - 107 mmol/L    CO2 22.5 20.0 - 31.0 mmol/L    Anion Gap 10 5 - 14 mmol/L    BUN 22 9 - 23 mg/dL    Creatinine 9.14 (H) 0.60 - 0.80 mg/dL    BUN/Creatinine Ratio 20     eGFR CKD-EPI (2021) Female 52 (L) >=60 mL/min/1.4m2    Glucose 144 70 - 179 mg/dL    Calcium 9.4 8.7 - 78.2 mg/dL   POCT Glucose    Collection Time: 05/12/22  9:09 AM   Result Value Ref Range    Glucose, POC 289 (H) 70 - 179 mg/dL   POCT Glucose    Collection Time: 05/12/22 11:25 AM   Result Value Ref Range    Glucose, POC 173 70 - 179 mg/dL       Relevant Studies/Radiology (if blank, then none):  ECG 12 Lead    Result Date: 05/11/2022  SINUS RHYTHM WITH FREQUENT PREMATURE VENTRICULAR BEATS POSSIBLE LEFT ATRIAL ENLARGEMENT POOR R WAVE PROGRESSION IN ANTERIOR PRECORDIAL LEADS ABNORMAL ECG WHEN COMPARED WITH ECG OF 24-Jan-2022 12:04, QUESTIONABLE CHANGE IN INITIAL FORCES OF ANTERIOR LEADS -CONSIDER LEAD PLACEMENT Confirmed by Christella Noa [phone]) on 05/11/2022 8:54:32 AM    CTA Chest W Wo Contrast    Result Date: 05/12/2022  EXAM: CTA Chest for Pulmonary Embolus DATE: 05/11/2022 9:20 PM ACCESSION: [id number] UN DICTATED: 05/11/2022 9:24 PM INTERPRETATION LOCATION: [facility] CLINICAL INDICATION: 81 year old female with possible pulmonary embolism.  COMPARISON: CTA chest 06/22/2020 TECHNIQUE: Contiguous 1 and 2 mm axial images were reconstructed through the chest following a single breath hold helical acquisition.  Images were reformatted in the sagittal and coronal planes.  Multiplanar MIP slabs were created for more thorough evaluation of the pulmonary vasculature. Intravenous contrast was administered. The examination was performed with limited z-axis coverage and reduced kVp to improve vascular opacification and reduce breath hold and contrast and radiation dose. kVP:  100 DLP:  65 FINDINGS: PULMONARY ARTERIES: No emboli in either lung. LUNGS AND AIRWAYS: Extensive coalescent centrilobular emphysema bilaterally. Subsegmental passive atelectasis of the lower lobes from the large hiatal hernia. PLEURA: No pleural fluid or pneumothorax., MEDIASTINUM AND LYMPH NODES: No enlarged intrathoracic or axillary lymph nodes are present.    HEART  AND VASCULATURE:  Right atrial and ventricular enlargement. Significant enlargement main pulmonary artery measuring nearly 4 cm in diameter. Calcifications of the mitral annulus and coronary arteries. Thoracic aortic calcifications.  Ascending and descending aorta normal in caliber.    There is no pericardial effusion. BONES AND SOFT TISSUES: Degenerative changes of the spine. UPPER ABDOMEN:  Large hiatal hernia containing large bowel, stomach, and pancreas, unchanged.     No pulmonary embolus. Unchanged large hiatal hernia containing bowel, pancreas, and stomach. Enlargement of the right cardiac chambers and main pulmonary artery suggestive of pulmonary arterial hypertension.     XR Chest 2 views    Result Date: 05/11/2022  EXAM: XR CHEST 2 VIEWS DATE: 05/10/2022 6:40 PM ACCESSION: [id number] UN DICTATED: 05/10/2022 6:54 PM INTERPRETATION LOCATION: [facility] CLINICAL INDICATION: 81 year old Female with DYSPNEA  TECHNIQUE: PA and Lateral Chest Radiographs. COMPARISON: CXR 06/19/2020, CT chest 06/22/2020 FINDINGS: Similar chronic large soft tissue density with air-fluid levels projecting over the mid chest and right lung base consistent with known large hiatal hernia. Prominent interstitial opacities, unchanged. No pleural effusion or pneumothorax. Cardiac silhouette is normal in size. Thoracic aorta with calcifications. Stable right superior paratracheal density consistent with tortuous vessel on prior CT. Partially imaged left shoulder arthroplasty.     Query mild interstitial pulmonary edema. Large hiatal hernia. ==================== MODIFIED REPORT: (05/11/2022 7:53 AM) This report has been modified from its preliminary version; you may check the prior versions of radiology report, results history link for prior report versions. -----------------------------------------------    CT Abdomen Pelvis W IV Contrast    Result Date: 05/10/2022  EXAM: CT ABDOMEN PELVIS W CONTRAST DATE: 05/10/2022 6:49 PM ACCESSION: [id number] UN DICTATED: 05/10/2022 7:17 PM INTERPRETATION LOCATION: [facility] CLINICAL INDICATION: 81 years old with abdominal pain ; RLQ abdominal pain  COMPARISON: CT abdomen pelvis 06/22/2020 TECHNIQUE: A helical CT scan of the abdomen and pelvis was obtained following IV contrast from the lung bases through the pubic symphysis. Images were reconstructed in the axial plane. Coronal and sagittal reformatted images were also provided for further evaluation. FINDINGS: LOWER CHEST: Bibasilar atelectasis. Left upper lobe pneumatocele. Compression of the heart by the large hiatal hernia containing stomach and colon. Moderate coronary atherosclerotic calcifications. LIVER: Normal liver contour.  No focal liver lesions. BILIARY: The gallbladder is surgically absent. No biliary ductal dilatation.  SPLEEN: Normal in size and contour. PANCREAS: Fatty infiltration of the pancreas. No focal lesions.  No ductal dilation. ADRENAL GLANDS: Nodular thickening of the adrenal glands. Unchanged enhancement of the right adrenal gland. KIDNEYS/URETERS: Mild right hydroureteronephrosis with urothelial enhancement. Mild left hydronephrosis. Nonobstructing stones in the right lower pole measuring up to 5 mm (3:52). Cortical thinning of the left upper pole, unchanged. No solid renal mass. Bilateral cysts and subcentimeter hypoattenuating lesions, too small to characterize. BLADDER: Surgically absent. No enhancing tissue at the surgical bed. REPRODUCTIVE ORGANS: Hysterectomy. No adnexal lesions. GI TRACT: Large hiatal hernia containing stomach, large bowel, and pancreas, unchanged. Right lower quadrant ileoconduit. Normal appendix. PERITONEUM, RETROPERITONEUM AND MESENTERY: No free air.  No ascites.  No fluid collection. LYMPH NODES: Retroperitoneal lymph node dissection. VESSELS: Hepatic and portal veins are patent.  Normal caliber aorta. Scattered calcified atherosclerosis of the abdominal aorta and its branching vessels. BONES and SOFT TISSUES: Degenerative changes of the second lumbar spine with grade 1 anterolisthesis of L4 on L5. Numerous subcutaneous calcifications in the posterior abdominal wall soft tissues.     Right hydroureteronephrosis with urothelial enhancement suggestive of pyelitis and concerning for  ascending urinary tract infection. Recommend correlation with urinalysis. New left mild hydronephrosis. Additional chronic and incidental findings, as detailed in the body of the report.   ______________________________________________________________________  Discharge Instructions:           Other Instructions       Discharge instructions      Take antibiotics as prescribed. Take pain and nausea medication as needed. Return to the ER if you are unable to keep down the antibiotics, if you have continued fever, vomiting despite the medication, or other concerns.  Follow up with your doctor for recheck in 3-5 days.  If you do not have a doctor, follow up with one of the numbers listed    PYELONEPHRITIS/Pyelitis:  You have been diagnosed with Pyelitis, also known as a kidney infection.    Pyelitis is a bacterial infection in your kidneys. Symptoms include fever, chills, nausea and vomiting, back pain on one or both sides, and sometimes difficulty urinating. You may also feel very weak. The diagnosis is made based on your symptoms and a test of your urine.    Pyelitis most commonly occurs when a lower urinary tract infection (bladder infection) ascends (climbs up) the urinary tract and gets into the kidney. Early treatment of a bladder infection is important to prevent pyelonephritis and the possibility of kidney damage.    Pyelitis is treated with antibiotics, pain and fever medications, and fluids. Some patients require an IV if they have nausea or vomiting and cannot keep down the antibiotics, or if they aren????t improving after 2-3 days of oral (by mouth) medications.  Most patients do not need to be admitted to the hospital for pyelonephritis.  A few patients who don't do well with the oral (by mouth) antibiotics or become sicker despite treatment may need to return to be admitted to the hospital.    YOU SHOULD SEEK MEDICAL ATTENTION IMMEDIATELY, EITHER HERE OR AT THE NEAREST EMERGENCY DEPARTMENT, IF ANY OF THE FOLLOWING OCCURS:     ??   Failure to improve after 2-3 days of antibiotics.   ??   You develop nausea or vomiting and are unable to keep down    medications or fluids.   ??   Increased weakness or lightheadedness.   ??   You develop any progressive or worsening symptoms or any    other concerns.                 Follow Up instructions and Outpatient Referrals     Ambulatory referral to Home Health      Is this a [facility] or [facility] Patient?: Yes    Home Health Options: Traditional Home Health    Is this patient at high risk for COVID 19 transmission and recommended to   stay at home during this pandemic?: No    If the patient has a diagnosis of heart failure and is already on oral   diuretics, do you want to activate the in home IV Lasix protocol?: No    Do you want agency provider parameter notifications or patient specific   provider parameter notifications?: Agency    Do you want to initiate remote patient monitoring?: No    Physician to follow patient's care: Referring Provider    Disciplines requested:  Physical Therapy  Occupational Therapy       Physical Therapy requested: Evaluate and treat    Occupational Therapy Requested: Evaluate and treat    Requested SOC Date:  Comment - within 31  hours    Ambulatory referral to Urology      Discharge instructions          Appointments which have been scheduled for you      May 23, 2022 10:55 AM  (Arrive by 10:45 AM)  RETURN CONTINUITY with Vanetta Shawl, MD  [facility] FAMILY MEDICINE [place] [facility]) [address]  [place] [address]  [phone]        Jun 05, 2022  EXTRACAPSULAR CATARACT REMOVAL W/INSERTION OF INTRAOCULAR LENS PROSTHESIS, MANUAL OR MECHANICAL TECHNIQUE WITHOUT ENDOSCOPIC CYCLOPHOTOCOAGULATION with Sinthu Jamey Reas, MD  [facility]) [address]  [place] [address]  [phone]     Jun 06, 2022 11:15 AM  POST OP with Sinthu Jamey Reas, MD  [place] OPHTHALMOLOGY [address] [facility] REGION) [address]  [address]  [place] [address]  [phone]        Jun 19, 2022  EXTRACAPSULAR CATARACT REMOVAL W/INSERTION OF INTRAOCULAR LENS PROSTHESIS, MANUAL OR MECHANICAL TECHNIQUE WITHOUT ENDOSCOPIC CYCLOPHOTOCOAGULATION with Sinthu Jamey Reas, MD  PERIOP [facility] REGION) [address]  [place] [address]  [phone]     Jun 20, 2022 10:45 AM  POST OP with Sinthu Jamey Reas, MD  [place] OPHTHALMOLOGY [address] [place] [facility] REG[facility]) [address]  [address]  [place] [address]  [phone]        Jul 25, 2022 11:15 AM  (Arrive by 11:00 AM)  Danelle Earthly RETURN with Rosana Hoes, MD  [facility] CARDIOLOGY [place] [facility] REGION) [address]  [place] [address]  [phone]        Oct 03, 2022 10:30 AM  (Arrive by 10:00 AM)  US RENAL COMPLETE with Verlee Monte [place] RM 0  [facility] ULTRASOUND [facility]) [address]  [place] [address]  [phone]        Oct 03, 2022 11:30 AM  (Arrive by 11:00 AM)  RETURN GENERAL with Mancel Bale, MD  [facility] UROLOGY [address] [facility] [place] [facility] REGION) [address]  [place] [address]  [phone]             ______________________________________________________________________  Discharge Day Services:  BP 146/84  - Pulse 82  - Temp 36.8 ??C (98.3 ??F) (Temporal)  - Resp 18  - Ht 152.4 cm (5')  - Wt 56.2 kg (123 lb 14.4 oz)  - SpO2 91%  - BMI 24.20 kg/m??   Pt seen on the day of discharge and determined appropriate for discharge.    GEN: well appearing, lying in bed, NAD  HEENT: NCAT, MMM. EOMI.  Neck: Supple.  CV: Regular rate and rhythm. No murmurs/rubs/gallops.  Pulm: CTAB. No wheezing, crackles, or rhonchi.  Abd: Colostomy present. Flat.  Nontender. No guarding, rebound.  Normoactive bowel sounds.    Neuro: A&O x 3. No focal deficits.   Ext: No peripheral edema.  Palpable distal pulses.    Condition at Discharge: good    Length of Discharge: I spent greater than 30 mins in the discharge of this patient.

## 2022-05-12 NOTE — Unmapped (Signed)
Pt admitted for management of UTI. VSS and afebrile. Pt weaned down to RA. IV abx as ordered. Adequate PO intake. Pt scheduled for Chest CTA around shift change, advised pt to be NPO for at least 2 hours, awaiting new IV. Urostomy in place draining. Worked with PT/OT. Family at bedside, pt remains free from falls.     Problem: Adult Inpatient Plan of Care  Goal: Plan of Care Review  Outcome: Progressing  Goal: Patient-Specific Goal (Individualized)  Outcome: Progressing  Goal: Absence of Hospital-Acquired Illness or Injury  Outcome: Progressing  Intervention: Identify and Manage Fall Risk  Recent Flowsheet Documentation  Taken 05/11/2022 1800 by Lonna Cobb, RN  Safety Interventions:   family at bedside   fall reduction program maintained   lighting adjusted for tasks/safety   low bed   nonskid shoes/slippers when out of bed  Taken 05/11/2022 1600 by Lonna Cobb, RN  Safety Interventions: fall reduction program maintained  Taken 05/11/2022 1400 by Lonna Cobb, RN  Safety Interventions:   family at bedside   fall reduction program maintained   lighting adjusted for tasks/safety   low bed   nonskid shoes/slippers when out of bed  Taken 05/11/2022 1200 by Lonna Cobb, RN  Safety Interventions:   family at bedside   fall reduction program maintained   low bed   lighting adjusted for tasks/safety   nonskid shoes/slippers when out of bed  Taken 05/11/2022 1000 by Lonna Cobb, RN  Safety Interventions:   family at bedside   fall reduction program maintained   lighting adjusted for tasks/safety   low bed   nonskid shoes/slippers when out of bed  Taken 05/11/2022 0800 by Lonna Cobb, RN  Safety Interventions:   family at bedside   fall reduction program maintained   lighting adjusted for tasks/safety   low bed   nonskid shoes/slippers when out of bed  Intervention: Prevent and Manage VTE (Venous Thromboembolism) Risk  Recent Flowsheet Documentation  Taken 05/11/2022 0800 by Lonna Cobb, RN  Activity Management:   activity adjusted per tolerance   activity encouraged  Goal: Optimal Comfort and Wellbeing  Outcome: Progressing  Goal: Readiness for Transition of Care  Outcome: Progressing  Goal: Rounds/Family Conference  Outcome: Progressing     Problem: Self-Care Deficit  Goal: Improved Ability to Complete Activities of Daily Living  Outcome: Progressing     Problem: Impaired Wound Healing  Goal: Optimal Wound Healing  Outcome: Progressing  Intervention: Promote Wound Healing  Recent Flowsheet Documentation  Taken 05/11/2022 0800 by Lonna Cobb, RN  Activity Management:   activity adjusted per tolerance   activity encouraged

## 2022-05-12 NOTE — Unmapped (Signed)
Physician Discharge Summary [facility]  3 BT1 [facility]  [name]  [place] [address]  Dept: [phone]  Loc: [phone]     Identifying Information:   Tammy Braun  08-05-1941  [id number]    Primary Care Physician: Evette Cristal, MD   Code Status: Full Code    Admit Date: 05/10/2022    Discharge Date: 05/12/2022     Discharge To: Home with Home Health and/or PT/OT    Discharge Service: [facility] - [facility]     Discharge Attending Physician: No att. 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 *      Outpatient Provider Follow Up Issues:   [ ]  Renal U/S prior to Urology Appt  [ ]  Labs and adjust lasix dose    Hospital Course:   Tammy Braun is a 81 y.o. female with a past medical history significant for DM II, COPD, HTN, CKD II, T2N0M0 bladder cancer status post radical cystectomy with ileal conduit 10/2011 who presents in setting of new onset right upper quadrant abdominal pain with mild epigastric pain, and brown sputum production. Patient will be admitted for acute complicated UTI requiring IV abx.     # E. Coli Pyelitis  Patient is s/p radical cystectomy with ileal conduit 10/2011. She was previously tx outpatient for UTI on 3/10 with Bactrim, sx resolved. On admission patient had leukocytosis to 18, afebrile, UA +LE, Nitrite concerning for UTI. CT A/P notable for right hydroureteronephrosis w/ urothelial enhancement suggestive of pyelitis and New left mild hydronephrosis. Urology was consulted in the ED given new left hydronephrosis and pyelitis of the right kidney. Urology did not suggest surgical intervention at this time but believe patient would benefit from IV abx. Patient received 3 doses of CTX and was then transitioned to ciprofloxacin of which she will take for 4 more days. She will need urology follow-up in about 6-8 weeks with a renal U/S prior to that.      # Brown Sputum Production - New O2 Req - Pulm. Hypertension: Patient had multiples episodes of brown and then pink tinged sputum production iso new O2 requirement c/f PE. Pt received a CTA that was negative for PE, but did show enlargement of the right cardiac chambers and main pulmonary artery suggestive of pulmonary arterial hypertension. She has a known history of pulm hypertension. She received a dose of IV lasix and was able to wean off of oxygen by the morning. We will increase her home lasix to 40 daily for 5 days to ensure euvolemia and have close PCP fu.     No admission procedures for hospital encounter.  ______________________________________________________________________  Discharge Medications:     Your Medication List        START taking these medications      ciprofloxacin HCl 500 MG tablet  Commonly known as: CIPRO  Take 1 tablet (500 mg total) by mouth Two (2) times a day for 4 days.  Start taking on: May 13, 2022     senna 8.6 mg tablet  Commonly known as: SENOKOT  Take 2 tablets by mouth nightly as needed for constipation.            CHANGE how you take these medications      furosemide 20 MG tablet  Commonly known as: LASIX  Take 40 mg lasix (2 tablets) daily for 5 days then return to 20 mg daily  What changed:   how much to take  how to take this  when to take this  additional instructions     tiotropium 18 mcg inhalation capsule  Commonly known as: SPIRIVA WITH HANDIHALER  Use once daily  What changed:   how much to take  how to take this  when to take this  reasons to take this  additional instructions            CONTINUE taking these medications      acetaminophen 500 MG tablet  Commonly known as: TYLENOL  Take 1 tablet (500 mg total) by mouth every six (6) hours as needed for pain.     albuterol 90 mcg/actuation inhaler  Commonly known as: PROVENTIL HFA;VENTOLIN HFA  Inhale 2 puffs every four (4) hours as needed for wheezing.     blood sugar diagnostic Strp  Commonly known as: ACCU-CHEK AVIVA PLUS TEST STRP  by Other route daily at 10am.     blood-glucose meter kit  Use to check blood sugar four times daily or as directed by MD. Dx: Diabetes, E08.00 *DISPENSE ACCU-CHEK AVIVA PLUS BRAND*     candesartan 8 MG tablet  Commonly known as: ATACAND  Take 0.5 tablets (4 mg total) by mouth daily.     cyclobenzaprine 5 MG tablet  Commonly known as: FLEXERIL  Take 1 tablet (5 mg total) by mouth nightly.     DULoxetine 20 MG capsule  Commonly known as: CYMBALTA  Take 1 capsule (20 mg total) by mouth daily.     fluPHENAZine decanoate 25 mg/mL injection  Commonly known as: PROLIXIN  Inject 2 mL (50 mg total) into the muscle every thirty (30) days.     gabapentin 100 MG capsule  Commonly known as: NEURONTIN  1-2 pills tid prn pain and numbness     inhalational spacing device Spcr  Commonly known as: E-Z SPACER  1 each by Miscellaneous route every four (4) hours as needed.     lancets Misc  Commonly known as: ACCU-CHEK SOFTCLIX LANCETS  1 each by Miscellaneous route daily at 10am.     MEDICAL SUPPLY ITEM  Entreal formula nutritionally supplement complete caloric density     mirtazapine 7.5 MG tablet  Commonly known as: REMERON  Take 1 tablet (7.5 mg total) by mouth nightly.     polyethylene glycol 17 gram packet  Commonly known as: MIRALAX  Take 17 g by mouth daily.              Allergies:  Lisinopril, Losartan, and Hctz [hydrochlorothiazide]  ______________________________________________________________________  Pending Test Results (if blank, then none):      Most Recent Labs:  All lab results last 24 hours -   Recent Results (from the past 24 hour(s))   POCT Glucose    Collection Time: 05/11/22  5:39 PM   Result Value Ref Range    Glucose, POC 158 70 - 179 mg/dL   POCT Glucose    Collection Time: 05/11/22  9:31 PM   Result Value Ref Range    Glucose,  POC 148 70 - 179 mg/dL   CBC    Collection Time: 05/12/22  7:39 AM   Result Value Ref Range    WBC 8.1 3.6 - 11.2 10*9/L    RBC 4.18 3.95 - 5.13 10*12/L    HGB 11.6 11.3 - 14.9 g/dL    HCT 16.1 09.6 - 04.5 %    MCV 84.0 77.6 - 95.7 fL    MCH 27.7 25.9 - 32.4 pg    MCHC 33.0 32.0 - 36.0 g/dL    RDW 40.9 (H) 81.1 - 15.2 %    MPV 8.1 6.8 - 10.7 fL    Platelet 295 150 - 450 10*9/L   Phosphorus Level    Collection Time: 05/12/22  7:39 AM   Result Value Ref Range    Phosphorus 3.9 2.4 - 5.1 mg/dL   Magnesium Level    Collection Time: 05/12/22  7:39 AM   Result Value Ref Range    Magnesium 1.8 1.6 - 2.6 mg/dL   Basic Metabolic Panel    Collection Time: 05/12/22  7:39 AM   Result Value Ref Range    Sodium 138 135 - 145 mmol/L    Potassium 4.3 3.4 - 4.8 mmol/L    Chloride 106 98 - 107 mmol/L    CO2 22.5 20.0 - 31.0 mmol/L    Anion Gap 10 5 - 14 mmol/L    BUN 22 9 - 23 mg/dL    Creatinine 9.14 (H) 0.60 - 0.80 mg/dL    BUN/Creatinine Ratio 20     eGFR CKD-EPI (2021) Female 52 (L) >=60 mL/min/1.4m2    Glucose 144 70 - 179 mg/dL    Calcium 9.4 8.7 - 78.2 mg/dL   POCT Glucose    Collection Time: 05/12/22  9:09 AM   Result Value Ref Range    Glucose, POC 289 (H) 70 - 179 mg/dL   POCT Glucose    Collection Time: 05/12/22 11:25 AM   Result Value Ref Range    Glucose, POC 173 70 - 179 mg/dL       Relevant Studies/Radiology (if blank, then none):  ECG 12 Lead    Result Date: 05/11/2022  SINUS RHYTHM WITH FREQUENT PREMATURE VENTRICULAR BEATS POSSIBLE LEFT ATRIAL ENLARGEMENT POOR R WAVE PROGRESSION IN ANTERIOR PRECORDIAL LEADS ABNORMAL ECG WHEN COMPARED WITH ECG OF 24-Jan-2022 12:04, QUESTIONABLE CHANGE IN INITIAL FORCES OF ANTERIOR LEADS -CONSIDER LEAD PLACEMENT Confirmed by Christella Noa [phone]) on 05/11/2022 8:54:32 AM    CTA Chest W Wo Contrast    Result Date: 05/12/2022  EXAM: CTA Chest for Pulmonary Embolus DATE: 05/11/2022 9:20 PM ACCESSION: [id number] UN DICTATED: 05/11/2022 9:24 PM INTERPRETATION LOCATION: [facility] CLINICAL INDICATION: 81 year old female with possible pulmonary embolism.  COMPARISON: CTA chest 06/22/2020 TECHNIQUE: Contiguous 1 and 2 mm axial images were reconstructed through the chest following a single breath hold helical acquisition.  Images were reformatted in the sagittal and coronal planes.  Multiplanar MIP slabs were created for more thorough evaluation of the pulmonary vasculature. Intravenous contrast was administered. The examination was performed with limited z-axis coverage and reduced kVp to improve vascular opacification and reduce breath hold and contrast and radiation dose. kVP:  100 DLP:  65 FINDINGS: PULMONARY ARTERIES: No emboli in either lung. LUNGS AND AIRWAYS: Extensive coalescent centrilobular emphysema bilaterally. Subsegmental passive atelectasis of the lower lobes from the large hiatal hernia. PLEURA: No pleural fluid or pneumothorax., MEDIASTINUM AND LYMPH NODES: No enlarged intrathoracic or axillary lymph nodes are present.    HEART  AND VASCULATURE:  Right atrial and ventricular enlargement. Significant enlargement main pulmonary artery measuring nearly 4 cm in diameter. Calcifications of the mitral annulus and coronary arteries. Thoracic aortic calcifications.  Ascending and descending aorta normal in caliber.    There is no pericardial effusion. BONES AND SOFT TISSUES: Degenerative changes of the spine. UPPER ABDOMEN:  Large hiatal hernia containing large bowel, stomach, and pancreas, unchanged.     No pulmonary embolus. Unchanged large hiatal hernia containing bowel, pancreas, and stomach. Enlargement of the right cardiac chambers and main pulmonary artery suggestive of pulmonary arterial hypertension.     XR Chest 2 views    Result Date: 05/11/2022  EXAM: XR CHEST 2 VIEWS DATE: 05/10/2022 6:40 PM ACCESSION: [id number] UN DICTATED: 05/10/2022 6:54 PM INTERPRETATION LOCATION: [facility] CLINICAL INDICATION: 81 year old Female with DYSPNEA  TECHNIQUE: PA and Lateral Chest Radiographs. COMPARISON: CXR 06/19/2020, CT chest 06/22/2020 FINDINGS: Similar chronic large soft tissue density with air-fluid levels projecting over the mid chest and right lung base consistent with known large hiatal hernia. Prominent interstitial opacities, unchanged. No pleural effusion or pneumothorax. Cardiac silhouette is normal in size. Thoracic aorta with calcifications. Stable right superior paratracheal density consistent with tortuous vessel on prior CT. Partially imaged left shoulder arthroplasty.     Query mild interstitial pulmonary edema. Large hiatal hernia. ==================== MODIFIED REPORT: (05/11/2022 7:53 AM) This report has been modified from its preliminary version; you may check the prior versions of radiology report, results history link for prior report versions. -----------------------------------------------    CT Abdomen Pelvis W IV Contrast    Result Date: 05/10/2022  EXAM: CT ABDOMEN PELVIS W CONTRAST DATE: 05/10/2022 6:49 PM ACCESSION: [id number] UN DICTATED: 05/10/2022 7:17 PM INTERPRETATION LOCATION: [facility] CLINICAL INDICATION: 81 years old with abdominal pain ; RLQ abdominal pain  COMPARISON: CT abdomen pelvis 06/22/2020 TECHNIQUE: A helical CT scan of the abdomen and pelvis was obtained following IV contrast from the lung bases through the pubic symphysis. Images were reconstructed in the axial plane. Coronal and sagittal reformatted images were also provided for further evaluation. FINDINGS: LOWER CHEST: Bibasilar atelectasis. Left upper lobe pneumatocele. Compression of the heart by the large hiatal hernia containing stomach and colon. Moderate coronary atherosclerotic calcifications. LIVER: Normal liver contour.  No focal liver lesions. BILIARY: The gallbladder is surgically absent. No biliary ductal dilatation.  SPLEEN: Normal in size and contour. PANCREAS: Fatty infiltration of the pancreas. No focal lesions.  No ductal dilation. ADRENAL GLANDS: Nodular thickening of the adrenal glands. Unchanged enhancement of the right adrenal gland. KIDNEYS/URETERS: Mild right hydroureteronephrosis with urothelial enhancement. Mild left hydronephrosis. Nonobstructing stones in the right lower pole measuring up to 5 mm (3:52). Cortical thinning of the left upper pole, unchanged. No solid renal mass. Bilateral cysts and subcentimeter hypoattenuating lesions, too small to characterize. BLADDER: Surgically absent. No enhancing tissue at the surgical bed. REPRODUCTIVE ORGANS: Hysterectomy. No adnexal lesions. GI TRACT: Large hiatal hernia containing stomach, large bowel, and pancreas, unchanged. Right lower quadrant ileoconduit. Normal appendix. PERITONEUM, RETROPERITONEUM AND MESENTERY: No free air.  No ascites.  No fluid collection. LYMPH NODES: Retroperitoneal lymph node dissection. VESSELS: Hepatic and portal veins are patent.  Normal caliber aorta. Scattered calcified atherosclerosis of the abdominal aorta and its branching vessels. BONES and SOFT TISSUES: Degenerative changes of the second lumbar spine with grade 1 anterolisthesis of L4 on L5. Numerous subcutaneous calcifications in the posterior abdominal wall soft tissues.     Right hydroureteronephrosis with urothelial enhancement suggestive of pyelitis and concerning for  ascending urinary tract infection. Recommend correlation with urinalysis. New left mild hydronephrosis. Additional chronic and incidental findings, as detailed in the body of the report.   ______________________________________________________________________  Discharge Instructions:           Other Instructions       Discharge instructions      Take antibiotics as prescribed. Take pain and nausea medication as needed. Return to the ER if you are unable to keep down the antibiotics, if you have continued fever, vomiting despite the medication, or other concerns.  Follow up with your doctor for recheck in 3-5 days.  If you do not have a doctor, follow up with one of the numbers listed    PYELONEPHRITIS/Pyelitis:  You have been diagnosed with Pyelitis, also known as a kidney infection.    Pyelitis is a bacterial infection in your kidneys. Symptoms include fever, chills, nausea and vomiting, back pain on one or both sides, and sometimes difficulty urinating. You may also feel very weak. The diagnosis is made based on your symptoms and a test of your urine.    Pyelitis most commonly occurs when a lower urinary tract infection (bladder infection) ascends (climbs up) the urinary tract and gets into the kidney. Early treatment of a bladder infection is important to prevent pyelonephritis and the possibility of kidney damage.    Pyelitis is treated with antibiotics, pain and fever medications, and fluids. Some patients require an IV if they have nausea or vomiting and cannot keep down the antibiotics, or if they aren????t improving after 2-3 days of oral (by mouth) medications.  Most patients do not need to be admitted to the hospital for pyelonephritis.  A few patients who don't do well with the oral (by mouth) antibiotics or become sicker despite treatment may need to return to be admitted to the hospital.    YOU SHOULD SEEK MEDICAL ATTENTION IMMEDIATELY, EITHER HERE OR AT THE NEAREST EMERGENCY DEPARTMENT, IF ANY OF THE FOLLOWING OCCURS:     ??   Failure to improve after 2-3 days of antibiotics.   ??   You develop nausea or vomiting and are unable to keep down    medications or fluids.   ??   Increased weakness or lightheadedness.   ??   You develop any progressive or worsening symptoms or any    other concerns.                 Follow Up instructions and Outpatient Referrals     Ambulatory referral to Home Health      Is this a [facility] or [facility] Patient?: Yes    Home Health Options: Traditional Home Health    Is this patient at high risk for COVID 19 transmission and recommended to   stay at home during this pandemic?: No    If the patient has a diagnosis of heart failure and is already on oral   diuretics, do you want to activate the in home IV Lasix protocol?: No    Do you want agency provider parameter notifications or patient specific   provider parameter notifications?: Agency    Do you want to initiate remote patient monitoring?: No    Physician to follow patient's care: Referring Provider    Disciplines requested:  Physical Therapy  Occupational Therapy       Physical Therapy requested: Evaluate and treat    Occupational Therapy Requested: Evaluate and treat    Requested SOC Date:  Comment - within 31  hours    Ambulatory referral to Urology      Discharge instructions          Appointments which have been scheduled for you      May 23, 2022 10:55 AM  (Arrive by 10:45 AM)  RETURN CONTINUITY with Vanetta Shawl, MD  [facility] FAMILY MEDICINE [place] [facility]) [address]  [place] [address]  [phone]        Jun 05, 2022  EXTRACAPSULAR CATARACT REMOVAL W/INSERTION OF INTRAOCULAR LENS PROSTHESIS, MANUAL OR MECHANICAL TECHNIQUE WITHOUT ENDOSCOPIC CYCLOPHOTOCOAGULATION with Sinthu Jamey Reas, MD  [facility]) [address]  [place] [address]  [phone]     Jun 06, 2022 11:15 AM  POST OP with Sinthu Jamey Reas, MD  [place] OPHTHALMOLOGY [address] [facility] REGION) [address]  [address]  [place] [address]  [phone]        Jun 19, 2022  EXTRACAPSULAR CATARACT REMOVAL W/INSERTION OF INTRAOCULAR LENS PROSTHESIS, MANUAL OR MECHANICAL TECHNIQUE WITHOUT ENDOSCOPIC CYCLOPHOTOCOAGULATION with Sinthu Jamey Reas, MD  PERIOP [facility] REGION) [address]  [place] [address]  [phone]     Jun 20, 2022 10:45 AM  POST OP with Sinthu Jamey Reas, MD  [place] OPHTHALMOLOGY [address] [place] [facility] REG[facility]) [address]  [address]  [place] [address]  [phone]        Jul 25, 2022 11:15 AM  (Arrive by 11:00 AM)  Danelle Earthly RETURN with Rosana Hoes, MD  [facility] CARDIOLOGY [place] [facility] REGION) [address]  [place] [address]  [phone]        Oct 03, 2022 10:30 AM  (Arrive by 10:00 AM)  US RENAL COMPLETE with Verlee Monte [place] RM 0  [facility] ULTRASOUND [facility]) [address]  [place] [address]  [phone]        Oct 03, 2022 11:30 AM  (Arrive by 11:00 AM)  RETURN GENERAL with Mancel Bale, MD  [facility] UROLOGY [address] [facility] [place] [facility] REGION) [address]  [place] [address]  [phone]             ______________________________________________________________________  Discharge Day Services:  BP 146/84  - Pulse 82  - Temp 36.8 ??C (98.3 ??F) (Temporal)  - Resp 18  - Ht 152.4 cm (5')  - Wt 56.2 kg (123 lb 14.4 oz)  - SpO2 91%  - BMI 24.20 kg/m??   Pt seen on the day of discharge and determined appropriate for discharge.    GEN: well appearing, lying in bed, NAD  HEENT: NCAT, MMM. EOMI.  Neck: Supple.  CV: Regular rate and rhythm. No murmurs/rubs/gallops.  Pulm: CTAB. No wheezing, crackles, or rhonchi.  Abd: Colostomy present. Flat.  Nontender. No guarding, rebound.  Normoactive bowel sounds.    Neuro: A&O x 3. No focal deficits.   Ext: No peripheral edema.  Palpable distal pulses.    Condition at Discharge: good    Length of Discharge: I spent greater than 30 mins in the discharge of this patient.

## 2022-05-13 LAB — CBC
HEMATOCRIT: 36.8 % (ref 34.0–44.0)
HEMOGLOBIN: 12.2 g/dL (ref 11.3–14.9)
MEAN CORPUSCULAR HEMOGLOBIN CONC: 33.1 g/dL (ref 32.0–36.0)
MEAN CORPUSCULAR HEMOGLOBIN: 27.8 pg (ref 25.9–32.4)
MEAN CORPUSCULAR VOLUME: 84.1 fL (ref 77.6–95.7)
MEAN PLATELET VOLUME: 8.4 fL (ref 6.8–10.7)
PLATELET COUNT: 329 10*9/L (ref 150–450)
RED BLOOD CELL COUNT: 4.38 10*12/L (ref 3.95–5.13)
RED CELL DISTRIBUTION WIDTH: 17.6 % — ABNORMAL HIGH (ref 12.2–15.2)
WBC ADJUSTED: 8.2 10*9/L (ref 3.6–11.2)

## 2022-05-13 LAB — BASIC METABOLIC PANEL
ANION GAP: 7 mmol/L (ref 5–14)
BLOOD UREA NITROGEN: 22 mg/dL (ref 9–23)
BUN / CREAT RATIO: 21
CALCIUM: 9.8 mg/dL (ref 8.7–10.4)
CHLORIDE: 102 mmol/L (ref 98–107)
CO2: 28.4 mmol/L (ref 20.0–31.0)
CREATININE: 1.05 mg/dL — ABNORMAL HIGH
EGFR CKD-EPI (2021) FEMALE: 54 mL/min/{1.73_m2} — ABNORMAL LOW (ref >=60)
GLUCOSE RANDOM: 143 mg/dL (ref 70–179)
POTASSIUM: 5 mmol/L — ABNORMAL HIGH (ref 3.4–4.8)
SODIUM: 137 mmol/L (ref 135–145)

## 2022-05-13 LAB — PHOSPHORUS: PHOSPHORUS: 3.2 mg/dL (ref 2.4–5.1)

## 2022-05-13 LAB — MAGNESIUM: MAGNESIUM: 2.1 mg/dL (ref 1.6–2.6)

## 2022-05-13 MED ORDER — CIPROFLOXACIN 500 MG TABLET
ORAL_TABLET | Freq: Two times a day (BID) | ORAL | 0 refills | 3 days | Status: CP
Start: 2022-05-13 — End: 2022-05-16

## 2022-05-13 MED ADMIN — furosemide (LASIX) tablet 40 mg: 40 mg | ORAL | @ 13:00:00 | Stop: 2022-05-13

## 2022-05-13 MED ADMIN — acetaminophen (TYLENOL) tablet 650 mg: 650 mg | ORAL | @ 15:00:00 | Stop: 2022-05-13

## 2022-05-13 MED ADMIN — cyclobenzaprine (FLEXERIL) tablet 5 mg: 5 mg | ORAL | @ 02:00:00

## 2022-05-13 MED ADMIN — polyethylene glycol (MIRALAX) packet 34 g: 34 g | ORAL | @ 13:00:00 | Stop: 2022-05-13

## 2022-05-13 MED ADMIN — ciprofloxacin HCl (CIPRO) tablet 500 mg: 500 mg | ORAL | @ 13:00:00 | Stop: 2022-05-13

## 2022-05-13 MED ADMIN — insulin lispro (HumaLOG) injection 0-20 Units: 0-20 [IU] | SUBCUTANEOUS

## 2022-05-13 MED ADMIN — senna (SENOKOT) tablet 2 tablet: 2 | ORAL | @ 02:00:00

## 2022-05-13 MED ADMIN — umeclidinium (INCRUSE ELLIPTA) 62.5 mcg/actuation inhaler 1 puff: 1 | RESPIRATORY_TRACT | @ 12:00:00 | Stop: 2022-05-13

## 2022-05-13 MED ADMIN — enoxaparin (LOVENOX) syringe 40 mg: 40 mg | SUBCUTANEOUS | @ 13:00:00 | Stop: 2022-05-13

## 2022-05-13 MED ADMIN — nicotine (NICODERM CQ) 21 mg/24 hr patch 1 patch: 1 | TRANSDERMAL | @ 13:00:00 | Stop: 2022-05-13

## 2022-05-13 NOTE — Unmapped (Signed)
Patient sleeping between crew. No complaints of pain or discomfort. No new changes in assessment. Medium size bowel movement overnight. Room air. Call bell with reach. Daughter at bedside.      Problem: Behavioral Health Comorbidity  Goal: Maintenance of Behavioral Health Symptom Control  Outcome: Ongoing - Unchanged     Problem: COPD (Chronic Obstructive Pulmonary Disease) Comorbidity  Goal: Maintenance of COPD Symptom Control  Outcome: Ongoing - Unchanged     Problem: Hypertension Comorbidity  Goal: Blood Pressure in Desired Range  Outcome: Ongoing - Unchanged     Problem: Adult Inpatient Plan of Care  Goal: Plan of Care Review  Outcome: Ongoing - Unchanged

## 2022-05-13 NOTE — Unmapped (Signed)
Physician Discharge Summary [facility]BR  3 BT1 [facility]BR  [name]  [place] [address]  Dept: [phone]  Loc: [phone]     Identifying Information:   Tammy Braun  March 15, 1941  [id number]    Primary Care Physician: Evette Cristal, MD   Code Status: Full Code    Admit Date: 05/10/2022    Discharge Date: 05/13/2022     Discharge To: Home with Home Health and/or PT/OT    Discharge Service: [facility] - [facility]     Discharge Attending Physician: No att. 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 *      Outpatient Provider Follow Up Issues:   [ ]  Renal U/S prior to Urology Appt  [ ]  Labs and adjust lasix dose  [ ]  Follow up sputum production    Hospital Course:   Tammy Braun is a 81 y.o. female with a past medical history significant for DM II, COPD, HTN, CKD II, T2N0M0 bladder cancer status post radical cystectomy with ileal conduit 10/2011 who presents in setting of new onset right upper quadrant abdominal pain with mild epigastric pain, and brown sputum production. Found to have pyelitis and admitted for IV antibiotics, hospital course by problem below:     # E. Coli Pyelitis  Patient is s/p radical cystectomy with ileal conduit 10/2011. She was previously tx outpatient for UTI on 3/10 with Bactrim, sx resolved. On admission patient had leukocytosis to 18, afebrile, UA +LE, Nitrite concerning for UTI. CT A/P notable for right hydroureteronephrosis w/ urothelial enhancement suggestive of pyelitis and New left mild hydronephrosis. Urology was consulted in the ED given new left hydronephrosis and pyelitis of the right kidney. Urology did not suggest surgical intervention at this time but believed patient would benefit from IV abx. Patient received 3 doses of CTX and was then transitioned to ciprofloxacin of which she will take for 4 more days. She will need urology follow-up in about 6-8 weeks with a renal U/S prior to that.      # Brown Sputum Production - New O2 Req - Pulm. Hypertension:   Patient had multiples episodes of brown and then pink tinged sputum production iso new O2 requirement c/f PE. Pt received a CTA that was negative for PE, but did show enlargement of the right cardiac chambers and main pulmonary artery suggestive of pulmonary arterial hypertension. She has a known history of pulm hypertension. She received a dose of IV lasix and was able to wean off of oxygen by the morning. We will increase her home lasix to 40 daily for 5 days to ensure euvolemia and have close PCP fu. Her sputum is likely related to her continued smoking, but she will follow up with PCP if not resolved.    On 5/14, she was tolerating PO antibiotics and deemed ready for discharge.      No admission procedures for hospital encounter.  ______________________________________________________________________  Discharge Medications:     Your Medication List  START taking these medications      ciprofloxacin HCl 500 MG tablet  Commonly known as: CIPRO  Take 1 tablet (500 mg total) by mouth Two (2) times a day for 4 days.     senna 8.6 mg tablet  Commonly known as: SENOKOT  Take 2 tablets by mouth nightly as needed for constipation.            CHANGE how you take these medications      furosemide 20 MG tablet  Commonly known as: LASIX  Take 40 mg lasix (2 tablets) daily for 5 days then return to 20 mg daily  What changed:   how much to take  how to take this  when to take this  additional instructions     tiotropium 18 mcg inhalation capsule  Commonly known as: SPIRIVA WITH HANDIHALER  Use once daily  What changed:   how much to take  how to take this  when to take this  reasons to take this  additional instructions            CONTINUE taking these medications      acetaminophen 500 MG tablet  Commonly known as: TYLENOL  Take 1 tablet (500 mg total) by mouth every six (6) hours as needed for pain.     albuterol 90 mcg/actuation inhaler  Commonly known as: PROVENTIL HFA;VENTOLIN HFA  Inhale 2 puffs every four (4) hours as needed for wheezing.     blood sugar diagnostic Strp  Commonly known as: ACCU-CHEK AVIVA PLUS TEST STRP  by Other route daily at 10am.     blood-glucose meter kit  Use to check blood sugar four times daily or as directed by MD. Dx: Diabetes, E08.00 *DISPENSE ACCU-CHEK AVIVA PLUS BRAND*     candesartan 8 MG tablet  Commonly known as: ATACAND  Take 0.5 tablets (4 mg total) by mouth daily.     cyclobenzaprine 5 MG tablet  Commonly known as: FLEXERIL  Take 1 tablet (5 mg total) by mouth nightly.     DULoxetine 20 MG capsule  Commonly known as: CYMBALTA  Take 1 capsule (20 mg total) by mouth daily.     fluPHENAZine decanoate 25 mg/mL injection  Commonly known as: PROLIXIN  Inject 2 mL (50 mg total) into the muscle every thirty (30) days.     gabapentin 100 MG capsule  Commonly known as: NEURONTIN  1-2 pills tid prn pain and numbness     inhalational spacing device Spcr  Commonly known as: E-Z SPACER  1 each by Miscellaneous route every four (4) hours as needed.     lancets Misc  Commonly known as: ACCU-CHEK SOFTCLIX LANCETS  1 each by Miscellaneous route daily at 10am.     MEDICAL SUPPLY ITEM  Entreal formula nutritionally supplement complete caloric density     mirtazapine 7.5 MG tablet  Commonly known as: REMERON  Take 1 tablet (7.5 mg total) by mouth nightly.     polyethylene glycol 17 gram packet  Commonly known as: MIRALAX  Take 17 g by mouth daily.              Allergies:  Lisinopril, Losartan, and Hctz [hydrochlorothiazide]  ______________________________________________________________________  Pending Test Results (if blank, then none):      Most Recent Labs:  All lab results last 24 hours -   Recent Results (from the past 24 hour(s))   POCT Glucose    Collection Time: 05/12/22 11:25 AM   Result Value Ref  Range    Glucose, POC 173 70 - 179 mg/dL   POCT Glucose    Collection Time: 05/12/22  4:25 PM   Result Value Ref Range    Glucose, POC 116 70 - 179 mg/dL   POCT Glucose    Collection Time: 05/12/22  8:15 PM   Result Value Ref Range    Glucose, POC 183 (H) 70 - 179 mg/dL   POCT Glucose    Collection Time: 05/13/22  7:21 AM   Result Value Ref Range    Glucose, POC 139 70 - 179 mg/dL   CBC    Collection Time: 05/13/22  7:24 AM   Result Value Ref Range    WBC 8.2 3.6 - 11.2 10*9/L    RBC 4.38 3.95 - 5.13 10*12/L    HGB 12.2 11.3 - 14.9 g/dL    HCT 16.1 09.6 - 04.5 %    MCV 84.1 77.6 - 95.7 fL    MCH 27.8 25.9 - 32.4 pg    MCHC 33.1 32.0 - 36.0 g/dL    RDW 40.9 (H) 81.1 - 15.2 %    MPV 8.4 6.8 - 10.7 fL    Platelet 329 150 - 450 10*9/L   Phosphorus Level    Collection Time: 05/13/22  7:24 AM   Result Value Ref Range    Phosphorus 3.2 2.4 - 5.1 mg/dL   Magnesium Level    Collection Time: 05/13/22  7:24 AM   Result Value Ref Range    Magnesium 2.1 1.6 - 2.6 mg/dL   Basic Metabolic Panel    Collection Time: 05/13/22  7:24 AM   Result Value Ref Range    Sodium 137 135 - 145 mmol/L    Potassium 5.0 (H) 3.4 - 4.8 mmol/L    Chloride 102 98 - 107 mmol/L    CO2 28.4 20.0 - 31.0 mmol/L    Anion Gap 7 5 - 14 mmol/L    BUN 22 9 - 23 mg/dL    Creatinine 9.14 (H) 0.60 - 0.80 mg/dL    BUN/Creatinine Ratio 21     eGFR CKD-EPI (2021) Female 54 (L) >=60 mL/min/1.47m2    Glucose 143 70 - 179 mg/dL    Calcium 9.8 8.7 - 78.2 mg/dL       Relevant Studies/Radiology (if blank, then none):  ECG 12 Lead    Result Date: 05/11/2022  SINUS RHYTHM WITH FREQUENT PREMATURE VENTRICULAR BEATS POSSIBLE LEFT ATRIAL ENLARGEMENT POOR R WAVE PROGRESSION IN ANTERIOR PRECORDIAL LEADS ABNORMAL ECG WHEN COMPARED WITH ECG OF 24-Jan-2022 12:04, QUESTIONABLE CHANGE IN INITIAL FORCES OF ANTERIOR LEADS -CONSIDER LEAD PLACEMENT Confirmed by Christella Noa [phone]) on 05/11/2022 8:54:32 AM    CTA Chest W Wo Contrast    Result Date: 05/12/2022  EXAM: CTA Chest for Pulmonary Embolus DATE: 05/11/2022 9:20 PM ACCESSION: [id number] UN DICTATED: 05/11/2022 9:24 PM INTERPRETATION LOCATION: [facility] CLINICAL INDICATION: 81 year old female with possible pulmonary embolism.  COMPARISON: CTA chest 06/22/2020 TECHNIQUE: Contiguous 1 and 2 mm axial images were reconstructed through the chest following a single breath hold helical acquisition.  Images were reformatted in the sagittal and coronal planes.  Multiplanar MIP slabs were created for more thorough evaluation of the pulmonary vasculature. Intravenous contrast was administered. The examination was performed with limited z-axis coverage and reduced kVp to improve vascular opacification and reduce breath hold and contrast and radiation dose. kVP:  100 DLP:  65 FINDINGS: PULMONARY ARTERIES: No emboli in either lung. LUNGS AND AIRWAYS: Extensive coalescent centrilobular emphysema  bilaterally. Subsegmental passive atelectasis of the lower lobes from the large hiatal hernia. PLEURA: No pleural fluid or pneumothorax., MEDIASTINUM AND LYMPH NODES: No enlarged intrathoracic or axillary lymph nodes are present.    HEART AND VASCULATURE:  Right atrial and ventricular enlargement. Significant enlargement main pulmonary artery measuring nearly 4 cm in diameter. Calcifications of the mitral annulus and coronary arteries. Thoracic aortic calcifications.  Ascending and descending aorta normal in caliber.    There is no pericardial effusion. BONES AND SOFT TISSUES: Degenerative changes of the spine. UPPER ABDOMEN:  Large hiatal hernia containing large bowel, stomach, and pancreas, unchanged.     No pulmonary embolus. Unchanged large hiatal hernia containing bowel, pancreas, and stomach. Enlargement of the right cardiac chambers and main pulmonary artery suggestive of pulmonary arterial hypertension.     XR Chest 2 views    Result Date: 05/11/2022  EXAM: XR CHEST 2 VIEWS DATE: 05/10/2022 6:40 PM ACCESSION: [id number] UN DICTATED: 05/10/2022 6:54 PM INTERPRETATION LOCATION: [facility] CLINICAL INDICATION: 81 year old Female with DYSPNEA  TECHNIQUE: PA and Lateral Chest Radiographs. COMPARISON: CXR 06/19/2020, CT chest 06/22/2020 FINDINGS: Similar chronic large soft tissue density with air-fluid levels projecting over the mid chest and right lung base consistent with known large hiatal hernia. Prominent interstitial opacities, unchanged. No pleural effusion or pneumothorax. Cardiac silhouette is normal in size. Thoracic aorta with calcifications. Stable right superior paratracheal density consistent with tortuous vessel on prior CT. Partially imaged left shoulder arthroplasty.     Query mild interstitial pulmonary edema. Large hiatal hernia. ==================== MODIFIED REPORT: (05/11/2022 7:53 AM) This report has been modified from its preliminary version; you may check the prior versions of radiology report, results history link for prior report versions. -----------------------------------------------    CT Abdomen Pelvis W IV Contrast    Result Date: 05/10/2022  EXAM: CT ABDOMEN PELVIS W CONTRAST DATE: 05/10/2022 6:49 PM ACCESSION: [id number] UN DICTATED: 05/10/2022 7:17 PM INTERPRETATION LOCATION: [facility] CLINICAL INDICATION: 81 years old with abdominal pain ; RLQ abdominal pain  COMPARISON: CT abdomen pelvis 06/22/2020 TECHNIQUE: A helical CT scan of the abdomen and pelvis was obtained following IV contrast from the lung bases through the pubic symphysis. Images were reconstructed in the axial plane. Coronal and sagittal reformatted images were also provided for further evaluation. FINDINGS: LOWER CHEST: Bibasilar atelectasis. Left upper lobe pneumatocele. Compression of the heart by the large hiatal hernia containing stomach and colon. Moderate coronary atherosclerotic calcifications. LIVER: Normal liver contour.  No focal liver lesions. BILIARY: The gallbladder is surgically absent. No biliary ductal dilatation.  SPLEEN: Normal in size and contour. PANCREAS: Fatty infiltration of the pancreas. No focal lesions.  No ductal dilation. ADRENAL GLANDS: Nodular thickening of the adrenal glands. Unchanged enhancement of the right adrenal gland. KIDNEYS/URETERS: Mild right hydroureteronephrosis with urothelial enhancement. Mild left hydronephrosis. Nonobstructing stones in the right lower pole measuring up to 5 mm (3:52). Cortical thinning of the left upper pole, unchanged. No solid renal mass. Bilateral cysts and subcentimeter hypoattenuating lesions, too small to characterize. BLADDER: Surgically absent. No enhancing tissue at the surgical bed. REPRODUCTIVE ORGANS: Hysterectomy. No adnexal lesions. GI TRACT: Large hiatal hernia containing stomach, large bowel, and pancreas, unchanged. Right lower quadrant ileoconduit. Normal appendix. PERITONEUM, RETROPERITONEUM AND MESENTERY: No free air.  No ascites.  No fluid collection. LYMPH NODES: Retroperitoneal lymph node dissection. VESSELS: Hepatic and portal veins are patent.  Normal caliber aorta. Scattered calcified atherosclerosis of the abdominal aorta and its branching vessels. BONES and SOFT TISSUES: Degenerative changes of the  second lumbar spine with grade 1 anterolisthesis of L4 on L5. Numerous subcutaneous calcifications in the posterior abdominal wall soft tissues.     Right hydroureteronephrosis with urothelial enhancement suggestive of pyelitis and concerning for ascending urinary tract infection. Recommend correlation with urinalysis. New left mild hydronephrosis. Additional chronic and incidental findings, as detailed in the body of the report.    XR Abdomen 1 View    Result Date: 05/12/2022   EXAM: XR ABDOMEN 1 VIEW DATE: 05/12/2022 3:45 PM ACCESSION: [id number] UN DICTATED: 05/12/2022 4:40 PM INTERPRETATION LOCATION: [facility] CLINICAL INDICATION: 81 years old Female with CONSTIPATION  COMPARISON: CT 05/10/2022 TECHNIQUE: Supine view of the abdomen. FINDINGS: Scattered surgical clips about the right hemiabdomen and pelvis. Ostomy overlies the right lower quadrant. Gaseous distention of large bowel. Paucity of small bowel gas identified. Scattered calcifications throughout the both hemiabdomen, likely corresponding with the subcutaneous calcifications on prior CT. Degenerative change throughout the spine. Large right hiatal hernia.     No significant colonic stool burden. Nonobstructive bowel gas pattern.   ______________________________________________________________________  Discharge Instructions:           Other Instructions       Discharge instructions      Take antibiotics as prescribed. Take pain and nausea medication as needed. Return to the ER if you are unable to keep down the antibiotics, if you have continued fever, vomiting despite the medication, or other concerns.  Follow up with your doctor for recheck in 3-5 days.  If you do not have a doctor, follow up with one of the numbers listed    PYELONEPHRITIS/Pyelitis:  You have been diagnosed with Pyelitis, also known as a kidney infection.    Pyelitis is a bacterial infection in your kidneys. Symptoms include fever, chills, nausea and vomiting, back pain on one or both sides, and sometimes difficulty urinating. You may also feel very weak. The diagnosis is made based on your symptoms and a test of your urine.    Pyelitis most commonly occurs when a lower urinary tract infection (bladder infection) ascends (climbs up) the urinary tract and gets into the kidney. Early treatment of a bladder infection is important to prevent pyelonephritis and the possibility of kidney damage.    Pyelitis is treated with antibiotics, pain and fever medications, and fluids. Some patients require an IV if they have nausea or vomiting and cannot keep down the antibiotics, or if they aren????t improving after 2-3 days of oral (by mouth) medications.  Most patients do not need to be admitted to the hospital for pyelonephritis.  A few patients who don't do well with the oral (by mouth) antibiotics or become sicker despite treatment may need to return to be admitted to the hospital.    YOU SHOULD SEEK MEDICAL ATTENTION IMMEDIATELY, EITHER HERE OR AT THE NEAREST EMERGENCY DEPARTMENT, IF ANY OF THE FOLLOWING OCCURS:     ??   Failure to improve after 2-3 days of antibiotics.   ??   You develop nausea or vomiting and are unable to keep down    medications or fluids.   ??   Increased weakness or lightheadedness.   ??   You develop any progressive or worsening symptoms or any    other concerns.                 Follow Up instructions and Outpatient Referrals     Ambulatory referral to Home Health      Is this a [facility] or [facility]  Patient?: Yes    Home Health Options: Traditional Home Health    Is this patient at high risk for COVID 19 transmission and recommended to   stay at home during this pandemic?: No    If the patient has a diagnosis of heart failure and is already on oral   diuretics, do you want to activate the in home IV Lasix protocol?: No    Do you want agency provider parameter notifications or patient specific   provider parameter notifications?: Agency    Do you want to initiate remote patient monitoring?: No    Physician to follow patient's care: Referring Provider    Disciplines requested:  Physical Therapy  Occupational Therapy       Physical Therapy requested: Evaluate and treat    Occupational Therapy Requested: Evaluate and treat    Requested SOC Date:  Comment - within 48 hours    Ambulatory referral to Urology      Discharge instructions          Appointments which have been scheduled for you      May 23, 2022 10:55 AM  (Arrive by 10:45 AM)  RETURN CONTINUITY with Vanetta Shawl, MD  [facility] [place] REGION) [address]  [place] [address]  [phone]        Jun 05, 2022  EXTRACAPSULAR CATARACT REMOVAL W/INSERTION OF INTRAOCULAR LENS PROSTHESIS, MANUAL OR MECHANICAL TECHNIQUE WITHOUT ENDOSCOPIC CYCLOPHOTOCOAGULATION with Sinthu Jamey Reas, MD  PERIOP [facility] REGION) [address]  [place] [address]  [phone]     Jun 06, 2022 11:15 AM  POST OP with Sinthu Jamey Reas, MD  [place] [facility]Y [address] [facility] REGION) [address]  [address]  [place] [address]  [phone]        Jun 19, 2022  EXTRACAPSULAR CATARACT REMOVAL W/INSERTION OF INTRAOCULAR LENS PROSTHESIS, MANUAL OR MECHANICAL TECHNIQUE WITHOUT ENDOSCOPIC CYCLOPHOTOCOAGULATION with Sinthu Jamey Reas, MD  PERIOP [facility] REGION) [address]  [place] [address]  [phone]     Jun 20, 2022 10:45 AM  POST OP with Sinthu Jamey Reas, MD  [place] [facility]ALMOLOGY [address] [facility] REGION) [address]  [address]  [place] [address]  [phone]        Jul 25, 2022 11:15 AM  (Arrive by 11:00 AM)  Danelle Earthly RETURN with Rosana Hoes, MD  [facility] CARDIOLOGY [place] [facility] REGION) [address]  [place] [address]  [phone]        Oct 03, 2022 10:30 AM  (Arrive by 10:00 AM)  US RENAL COMPLETE with Verlee Monte [place] RM 0  IMG ULTRASOUND [facility]) [address]  [place] [address]  [phone]        Oct 03, 2022 11:30 AM  (Arrive by 11:00 AM)  RETURN GENERAL with Mancel Bale, MD  [facility] UROLOGY [address] [facility] [place] [facility] REGION) [address]  [place] [address]  [phone]             ______________________________________________________________________  Discharge Day Services:  BP 144/73  - Pulse 80  - Temp 36.7 ??C (98.1 ??F) (Temporal)  - Resp 18  - Ht 152.4 cm (5')  - Wt 56.2 kg (123 lb 14.4 oz)  - SpO2 95%  - BMI 24.20 kg/m??   Pt seen  on the day of discharge and determined appropriate for discharge.    GEN: well appearing, lying in bed, NAD  HEENT: NCAT, MMM. EOMI.  Neck: Supple.  CV: Regular rate and rhythm. No murmurs/rubs/gallops.  Pulm: CTAB. No wheezing, crackles, or rhonchi.  Abd: Colostomy present. Flat.  Nontender. No guarding, rebound.  Normoactive bowel sounds.    Neuro: A&O x 3. No focal deficits.   Ext: No peripheral edema.  Palpable distal pulses.    Condition at Discharge: good    Length of Discharge: I spent greater than 30 mins in the discharge of this patient.    Kaylamarie Swickard A. Katrinka Blazing, MD   PGY-1 [facility]

## 2022-05-13 NOTE — Unmapped (Signed)
Pt admitted for kidney infection. VSS. Tolerating diet well. No complaints of pain. Insulin given per sliding scale order. IV removed. Pt ready for discharge. Discharge instructions given with family at bedside with no questions.   Problem: Behavioral Health Comorbidity  Goal: Maintenance of Behavioral Health Symptom Control  Outcome: Progressing     Problem: COPD (Chronic Obstructive Pulmonary Disease) Comorbidity  Goal: Maintenance of COPD Symptom Control  Outcome: Progressing     Problem: Hypertension Comorbidity  Goal: Blood Pressure in Desired Range  Outcome: Progressing     Problem: Fall Injury Risk  Goal: Absence of Fall and Fall-Related Injury  Outcome: Progressing  Intervention: Promote Scientist, clinical (histocompatibility and immunogenetics) Documentation  Taken 05/13/2022 1400 by Charlann Lange, RN  Safety Interventions:   fall reduction program maintained   lighting adjusted for tasks/safety   low bed   family at bedside  Taken 05/13/2022 1200 by Charlann Lange, RN  Safety Interventions:   fall reduction program maintained   lighting adjusted for tasks/safety   low bed   family at bedside  Taken 05/13/2022 1000 by Charlann Lange, RN  Safety Interventions:   fall reduction program maintained   family at bedside   lighting adjusted for tasks/safety   low bed  Taken 05/13/2022 0800 by Charlann Lange, RN  Safety Interventions:   fall reduction program maintained   lighting adjusted for tasks/safety   low bed   family at bedside     Problem: Adult Inpatient Plan of Care  Goal: Plan of Care Review  Outcome: Progressing  Goal: Patient-Specific Goal (Individualized)  Outcome: Progressing  Goal: Absence of Hospital-Acquired Illness or Injury  Outcome: Progressing  Intervention: Identify and Manage Fall Risk  Recent Flowsheet Documentation  Taken 05/13/2022 1400 by Charlann Lange, RN  Safety Interventions:   fall reduction program maintained   lighting adjusted for tasks/safety   low bed   family at bedside  Taken 05/13/2022 1200 by Charlann Lange, RN  Safety Interventions:   fall reduction program maintained   lighting adjusted for tasks/safety   low bed   family at bedside  Taken 05/13/2022 1000 by Charlann Lange, RN  Safety Interventions:   fall reduction program maintained   family at bedside   lighting adjusted for tasks/safety   low bed  Taken 05/13/2022 0800 by Charlann Lange, RN  Safety Interventions:   fall reduction program maintained   lighting adjusted for tasks/safety   low bed   family at bedside  Intervention: Prevent and Manage VTE (Venous Thromboembolism) Risk  Recent Flowsheet Documentation  Taken 05/13/2022 1400 by Charlann Lange, RN  Activity Management: activity adjusted per tolerance  Taken 05/13/2022 1200 by Charlann Lange, RN  Activity Management: activity adjusted per tolerance  Taken 05/13/2022 1000 by Charlann Lange, RN  Activity Management: activity adjusted per tolerance  Taken 05/13/2022 0800 by Charlann Lange, RN  Activity Management: activity adjusted per tolerance  Goal: Optimal Comfort and Wellbeing  Outcome: Progressing  Goal: Readiness for Transition of Care  Outcome: Progressing  Goal: Rounds/Family Conference  Outcome: Progressing

## 2022-05-15 DIAGNOSIS — F259 Schizoaffective disorder, unspecified: Principal | ICD-10-CM

## 2022-05-17 NOTE — Unmapped (Signed)
[facility] Administered Medication Refill Coordination Note      NAME:Tammy Braun DOB: 17-Jun-1941      Medication: fluphenazine    Day Supply: 60 days      SHIPPING      Next delivery from [facility] [phone]) to [facility] for Tammy Braun is scheduled for 05/23.    Clinic contact: [name]     Patient's next nurse visit for administration: 06/09.    We will follow up with clinic monthly for standard refill processing and delivery.      Chen Holzman Samella Parr  Specialty Pharmacy Technician

## 2022-05-22 MED FILL — FLUPHENAZINE DECANOATE 25 MG/ML INJECTION SOLUTION: INTRAMUSCULAR | 28 days supply | Qty: 5 | Fill #2

## 2022-05-23 ENCOUNTER — Ambulatory Visit: Admit: 2022-05-23 | Discharge: 2022-05-24 | Payer: MEDICARE

## 2022-05-23 LAB — CBC W/ AUTO DIFF
BASOPHILS ABSOLUTE COUNT: 0 10*9/L (ref 0.0–0.1)
BASOPHILS RELATIVE PERCENT: 0.7 %
EOSINOPHILS ABSOLUTE COUNT: 0.3 10*9/L (ref 0.0–0.5)
EOSINOPHILS RELATIVE PERCENT: 4.2 %
HEMATOCRIT: 38.7 % (ref 34.0–44.0)
HEMOGLOBIN: 12.6 g/dL (ref 11.3–14.9)
LYMPHOCYTES ABSOLUTE COUNT: 1.4 10*9/L (ref 1.1–3.6)
LYMPHOCYTES RELATIVE PERCENT: 20.2 %
MEAN CORPUSCULAR HEMOGLOBIN CONC: 32.7 g/dL (ref 32.0–36.0)
MEAN CORPUSCULAR HEMOGLOBIN: 27.6 pg (ref 25.9–32.4)
MEAN CORPUSCULAR VOLUME: 84.4 fL (ref 77.6–95.7)
MEAN PLATELET VOLUME: 7.9 fL (ref 6.8–10.7)
MONOCYTES ABSOLUTE COUNT: 0.5 10*9/L (ref 0.3–0.8)
MONOCYTES RELATIVE PERCENT: 7.6 %
NEUTROPHILS ABSOLUTE COUNT: 4.6 10*9/L (ref 1.8–7.8)
NEUTROPHILS RELATIVE PERCENT: 67.3 %
PLATELET COUNT: 403 10*9/L (ref 150–450)
RED BLOOD CELL COUNT: 4.58 10*12/L (ref 3.95–5.13)
RED CELL DISTRIBUTION WIDTH: 17.5 % — ABNORMAL HIGH (ref 12.2–15.2)
WBC ADJUSTED: 6.8 10*9/L (ref 3.6–11.2)

## 2022-05-23 NOTE — Unmapped (Signed)
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)       Cardiovascular and Mediastinum    HTN (hypertension)       Musculoskeletal and Integument    Osteoarthritis of cervical spine       Other    Schizoaffective disorder (CMS-HCC)    Left shoulder pain     Other Visit Diagnoses       Nephrolithiasis    -  Primary    Relevant Orders    Urology    Pyelonephritis              Has plans for follow up ultrasound in June  F/U urology for kidney stones  Ok to have whole tab of ARB since BP a little high  They held it when hospitalized  Restarted when left  Updated problem list annotations  Database review and update  Med review and update  Lab review and update  Counseling provided and patient education    40 minutes with patient- 1/2 in face to face counseling on chronic health conditions and  recent hospitalization     SUBJECTIVE:    Ms. Lopezperez is a 81 y.o. female that presents to clinic today regarding the following issues:    Recently hospitalized with abd pain 5/11-5/14 2023  Vomited blood?  Had new onset right upper quadrant abdominal pain with mild epigastric pain, and brown sputum production - said she had pyelonephritis and given iv antibiotics  outpatient for UTI on 3/10 with Bactrim, sx resolved. On admission, UA +LE, Nitrite concerning for UTI. CT A/P notable for right hydroureteronephrosis w/ urothelial enhancement suggestive of pyelitis and new left mild hydronephrosis. Received 3 doses of CTX and was then transitioned to ciprofloxacin of which she will take for 4 more days. Had CTA that was negative for PE, but did show enlargement of the right cardiac chambers and main pulmonary artery suggestive of pulmonary arterial hypertension. UTI showed 50,000 to 100,000 CFU/mL Pseudomonas aeruginosa.     81 y.o. female with parastomal pain status post neoadjuvant chemotherapy and radical cystectomy with Ileal Conduit on 10/05/2011 for T2N0M0 bladder cancer.     PMH Chronic issues, including: COPD, Neuropathy, Chronic pain, HTN, DJD neck, hip, cervical stenosis, cervical radiculopathy, Chronic renal disease, Depression , Onychomycosis, Emphysema, Ca Trigone Bladder, Cervical Myofascial pain with chronic tension headaches with medication overuse headaches, left reverse total shoulder arthroplasty, large hiatal hernia, Pyelonephritis, Kidney stones          reports that she has been smoking cigarettes. She has a 42.75 pack-year smoking history. She has never used smokeless tobacco.    The following portions of the patient's history were reviewed and updated as appropriate: allergies, current medications, past family history, past medical history, past social history, past surgical history and problem list.    Review of systems for 10 organ systems conducted and no significant positives      OBJECTIVE:    Vital signs have been reviewed:   Vitals:    05/23/22 1025   BP: 162/93   Pulse: 77   Temp: 36.2 ??C (97.2 ??F)

## 2022-05-23 NOTE — Unmapped (Signed)
AVERAGE BLOOD PRESSURE:168/94 BPM P:81 BPM

## 2022-05-24 NOTE — Unmapped (Addendum)
Results Request     Patient Name: Tammy Braun   Caller: Self (Patient)  Contact Method: Telephone Call: Time- Any Time [phone]   Type of Results: Labs   Pt don't know how to work mychart would like Dr Gwenlyn Fudge to give her a call about her results. No further information given

## 2022-05-24 NOTE — Unmapped (Signed)
Alfreda following up on the order for the wheelchair for patient.     Alfreda from [facility] is requesting the visit/chart notes to send for authorization for the wheelchair.     She mentioned to please make sure that the notes are electronically signed before sending.    Ph. [phone]  Fax. [phone]

## 2022-05-25 NOTE — Unmapped (Signed)
Hi,     I think you was helping with this patient regarding a wheelchair . Please advise       Thanks ,Riley Lam

## 2022-05-25 NOTE — Unmapped (Signed)
Results Request     Patient Name: Tammy Braun   Caller: Self (Patient)  Contact Method: Telephone Call: Time- Any Time [phone]   Type of Results: Labs    05/23/22 Requesting a phone call to follow up on results.

## 2022-05-29 NOTE — Unmapped (Signed)
Patient Advice Request     Patient Name: Tammy Braun  Caller:  medical supplies  Contact Method: Telephone Call: Time- Any Time [phone]  Reason for Call: calling to see if patient has had a face to face with pcp to discuss wheelchair and if dr has signed off on note   Previously Discussed: yes  Appointment Offered: No

## 2022-05-30 NOTE — Unmapped (Signed)
Order Request     Patient Name: Tammy Braun   Caller: Therapy (PT/OT/SPEECH), Roger Shelter, Physical Therapist  Contact Method: Telephone Call: Time- Any Time [phone]  Type of Order: Verbal Order  Details about the Order: 1x for 9 weeks home health PT

## 2022-05-30 NOTE — Unmapped (Signed)
Order Request     Patient Name: Tammy Braun   Caller: Therapy (PT/OT/SPEECH) Roger Shelter, Physical Therapist  Contact Method: Telephone Call: Time- Any Time [phone]  Type of Order: Equipment /Medical Supply  Details about the Order: Therapist requesting DME. Transfer bench for shower and  [employer]

## 2022-05-31 NOTE — Unmapped (Signed)
Please see request .       Thanks, Riley Lam

## 2022-05-31 NOTE — Unmapped (Signed)
Order Request      Patient Name: Tammy Braun   Caller: Therapy (PT/OT/SPEECH) Roger Shelter, Physical Therapist  Contact Method: Telephone Call: Time- Any Time [phone]  Type of Order: Equipment /Medical Supply  Details about the Order: The physical therapist called back to leave the fax number to send the orders to [phone]. Therapist requesting DME. Transfer bench for shower and  [employer]. 1x for 9 weeks home health PT

## 2022-06-04 NOTE — Unmapped (Signed)
ASSESSMENT AND PLAN:  Tammy Braun is a 81 y.o. female with parastomal pain status post neoadjuvant chemotherapy and radical cystectomy with Ileal Conduit on 10/05/2011 for T2N0M0 bladder cancer.    Given that she is over 5 years out from her cystectomy, she does not require further oncologic follow up. She has a stable parastomal hernia.     She was recently seen in the ED for pyleonephritis treated with antibiotics.    RUS confirmed near- resolution of hydronephrosis. We will see her back in October to repeat the RUS and ensure complete resolution.    She still does have some constipation and I recommend trying miralax instead of milk of magnesia. She is accompanied by [name] - her daughter today.     PLAN:  Recommend daily Miralax (1 capful every day).   Follow up in October with RUS and labs (CBC, BMP, B12)  Called daughter with plan.    It was a pleasure seeing this delightful woman in my clinic today.    REASON FOR VISIT:   Follow-up status post cystectomy - and recent ED visit with pyelo.    HISTORY OF PRESENT ILLNESS:  Tammy Braun is a 81 y.o. 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    We have seen her for many years. She has a known stomal/parastomal hernia which has been managed conservatively. History of constipation which we have addressed many time, unfortunately patient is often forgetful to take daily bowel regimen. She has mild intermittent RLQ lower abodminal discomfort. Seems to be constipative/gas pains in nature. She does have bowel movements every other day during good weeks. No N/V. Stoma healthy pink, protuberant and productive of yellow urine.     She no longer needs oncologic surveillance and is followed annually with renal ultrasound and laboratory studies. She was last seen in my clinic in 09/2021 but went to the ED 04/2022 for pyelonephritis- treated with antibiotics. She is feeling much better today following completion of her abx. She had a cataract surgery recently and has that appointment later today.    05/2020  CT scan - no evidence of disease, stable parastomal hernia, mild R hydroureter (consistent with known reflux with conduit).   10/03/2021 RUS - no hydro, bilateral nephrolithiasis, bilateral cysts  05/10/2022 ED visit for pyelonephritis; CT showed R hydroureter with urothelial enhancement suggestive of pyelitis, new left mild hydro    She returns today with a repeat RUS to confirm resolution following treatment of UTI.    Past medical, surgical, family, and social history were reviewed and remain unchanged.    MEDICATIONS:   Current Outpatient Medications   Medication Sig Dispense Refill    acetaminophen (TYLENOL) 500 MG tablet Take 1 tablet (500 mg total) by mouth every six (6) hours as needed for pain.      albuterol HFA 90 mcg/actuation inhaler Inhale 2 puffs every four (4) hours as needed for wheezing. (Patient taking differently: Inhale 2 puffs Two (2) times a day.) 18 g 2    blood sugar diagnostic (ACCU-CHEK AVIVA PLUS TEST STRP) Strp by Other route daily at 10am. 50 strip 5    blood-glucose meter kit Use to check blood sugar four times daily or as directed by MD. Dx: Diabetes, E08.00 *DISPENSE ACCU-CHEK AVIVA PLUS BRAND* 1 each 0    candesartan (ATACAND) 8 MG tablet Take  0.5 tablets (4 mg total) by mouth daily. 30 tablet 11    cyclobenzaprine (FLEXERIL) 5 MG tablet Take 1 tablet (5 mg total) by mouth nightly. 30 tablet 5    DULoxetine (CYMBALTA) 20 MG capsule Take 1 capsule (20 mg total) by mouth daily. (Patient taking differently: Take 1 capsule (20 mg total) by mouth daily as needed.) 90 capsule 0    fluPHENAZine decanoate (PROLIXIN) 25 mg/mL injection Inject 2 mL (50 mg total) into the muscle every thirty (30) days. 5 mL 11    furosemide (LASIX) 20 MG tablet Take 40 mg lasix (2 tablets) daily for 5 days then return to 20 mg daily 35 tablet 0    gabapentin (NEURONTIN) 100 MG capsule 1-2 pills tid prn pain and numbness 270 capsule 3    inhalational spacing device (E-Z SPACER) Spcr 1 each by Miscellaneous route every four (4) hours as needed. 1 each 0    ketorolac (ACULAR) 0.5 % ophthalmic solution Administer 1 drop into the left eye two (2) times a day. 5 mL 0    lancets (ACCU-CHEK SOFTCLIX LANCETS) Misc 1 each by Miscellaneous route daily at 10am. 50 each 5    MEDICAL SUPPLY ITEM Entreal formula nutritionally supplement complete caloric density 3 application 3    mirtazapine (REMERON) 7.5 MG tablet Take 1 tablet (7.5 mg total) by mouth nightly. (Patient taking differently: Take 1 tablet (7.5 mg total) by mouth nightly as needed.) 90 tablet 1    moxifloxacin (VIGAMOX) 0.5 % ophthalmic solution Administer 1 drop into the left eye four (4) times a day for 7 days. 3 mL 0    polyethylene glycol (MIRALAX) 17 gram packet Take 17 g by mouth daily. 30 packet 11    prednisoLONE acetate (PRED FORTE) 1 % ophthalmic suspension Administer 1 drop into the left eye four (4) times a day. 10 mL 0    senna (SENOKOT) 8.6 mg tablet Take 2 tablets by mouth nightly as needed for constipation. 60 tablet 0    tiotropium (SPIRIVA WITH HANDIHALER) 18 mcg inhalation capsule Use once daily (Patient taking differently: Place 1 capsule (18 mcg total) into inhaler and inhale daily as needed.) 31 each 11     Current Facility-Administered Medications   Medication Dose Route Frequency Provider Last Rate Last Admin    [START ON 06/08/2022] fluPHENAZine decanoate (PROLIXIN) injection 50 mg  50 mg Intramuscular Q30 Days Lovett Calender, MD         ALLERGIES:    Allergies   Allergen Reactions    Lisinopril Anaphylaxis and Swelling    Losartan Dizziness    Hctz [Hydrochlorothiazide]      SIADH     ROS:   Negative upon 10-system review other than what is mentioned in the HPI.     PHYSICAL EXAMINATION:   GENERAL: Pleasant female in no acute distress.   VITAL SIGNS: Blood pressure 185/81, pulse 70, temperature 36.4 ??C (97.5 ??F), temperature source Temporal, resp. rate 18, weight 58.5 kg (128 lb 14.4 oz), not currently breastfeeding.  PULMONARY: Normal work of breathing, no use of accessory muscles  ABDOMEN: Soft, non-tender, non-distended.  No organomegaly. Soft reducible peristomal hernia noted. Incision sites are clean, dry, intact. Stoma is prolapsed, but pink with clear urine in bag.   PSYCHOLOGIC: Normal affect, normal mood    IMAGING:  XR Abdomen 1 View  Narrative:  EXAM: XR ABDOMEN 1 VIEW  DATE: 05/12/2022 3:45 PM  ACCESSION: [id number] UN  DICTATED: 05/12/2022 4:40 PM  INTERPRETATION LOCATION:  [facility]    CLINICAL INDICATION: 81 years old Female with CONSTIPATION      COMPARISON: CT 05/10/2022    TECHNIQUE: Supine view of the abdomen.    FINDINGS:   Scattered surgical clips about the right hemiabdomen and pelvis. Ostomy overlies the right lower quadrant. Gaseous distention of large bowel. Paucity of small bowel gas identified. Scattered calcifications throughout the both hemiabdomen, likely corresponding with the subcutaneous calcifications on prior CT. Degenerative change throughout the spine.    Large right hiatal hernia.  Impression: No significant colonic stool burden. Nonobstructive bowel gas pattern.  CTA Chest W Wo Contrast  Narrative: EXAM: CTA Chest for Pulmonary Embolus  DATE: 05/11/2022 9:20 PM  ACCESSION: [id number] UN  DICTATED: 05/11/2022 9:24 PM  INTERPRETATION LOCATION: [facility]    CLINICAL INDICATION: 81 year old female with possible pulmonary embolism.      COMPARISON: CTA chest 06/22/2020    TECHNIQUE: Contiguous 1 and 2 mm axial images were reconstructed through the chest following a single breath hold helical acquisition.  Images were reformatted in the sagittal and coronal planes.  Multiplanar MIP slabs were created for more thorough evaluation of the pulmonary vasculature. Intravenous contrast was administered.    The examination was performed with limited z-axis coverage and reduced kVp to improve vascular opacification and reduce breath hold and contrast and radiation dose.   kVP:  100  DLP:  65    FINDINGS:     PULMONARY ARTERIES: No emboli in either lung.     LUNGS AND AIRWAYS: Extensive coalescent centrilobular emphysema bilaterally. Subsegmental passive atelectasis of the lower lobes from the large hiatal hernia.    PLEURA: No pleural fluid or pneumothorax.,    MEDIASTINUM AND LYMPH NODES: No enlarged intrathoracic or axillary lymph nodes are present.        HEART AND VASCULATURE:  Right atrial and ventricular enlargement. Significant enlargement main pulmonary artery measuring nearly 4 cm in diameter. Calcifications of the mitral annulus and coronary arteries. Thoracic aortic calcifications.  Ascending and descending aorta normal in caliber.    There is no pericardial effusion.    BONES AND SOFT TISSUES: Degenerative changes of the spine.    UPPER ABDOMEN:  Large hiatal hernia containing large bowel, stomach, and pancreas, unchanged.  Impression: No pulmonary embolus.    Unchanged large hiatal hernia containing bowel, pancreas, and stomach.    Enlargement of the right cardiac chambers and main pulmonary artery suggestive of pulmonary arterial hypertension.    LABS:  Admission on 06/05/2022, Discharged on 06/05/2022   Component Date Value Ref Range Status    Glucose, POC 06/05/2022 109  70 - 179 mg/dL Final    Glucose, POC 78/46/9629 128  70 - 179 mg/dL Final

## 2022-06-04 NOTE — Unmapped (Signed)
Results Request     Patient Name: Tammy Braun   Caller: Self (Patient)  Contact Method: Telephone Call: Time- Any Time [phone]   Type of Results: Labs

## 2022-06-05 ENCOUNTER — Ambulatory Visit: Admit: 2022-06-05 | Discharge: 2022-06-05 | Payer: MEDICARE

## 2022-06-05 ENCOUNTER — Encounter: Admit: 2022-06-05 | Discharge: 2022-06-05 | Payer: MEDICARE

## 2022-06-05 MED ORDER — PREDNISOLONE ACETATE 1 % EYE DROPS,SUSPENSION
Freq: Four times a day (QID) | OPHTHALMIC | 0 refills | 50 days | Status: CP
Start: 2022-06-05 — End: ?
  Filled 2022-06-05: qty 10, 50d supply, fill #0

## 2022-06-05 MED ORDER — KETOROLAC 0.5 % EYE DROPS
Freq: Two times a day (BID) | OPHTHALMIC | 0 refills | 50 days | Status: CP
Start: 2022-06-05 — End: 2022-07-25
  Filled 2022-06-05: qty 5, 50d supply, fill #0

## 2022-06-05 MED ORDER — MOXIFLOXACIN 0.5 % EYE DROPS
Freq: Four times a day (QID) | OPHTHALMIC | 0 refills | 15 days | Status: CP
Start: 2022-06-05 — End: 2022-06-20
  Filled 2022-06-05: qty 3, 15d supply, fill #0

## 2022-06-05 MED ADMIN — fentaNYL (PF) (SUBLIMAZE) injection: INTRAVENOUS | @ 14:00:00 | Stop: 2022-06-05

## 2022-06-05 MED ADMIN — Buffered lidocaine 0.75%/epinephrine 1:4000 (1 mg/mL) EPI-Shugarcaine 2 mL syringe: INTRACAMERAL | @ 14:00:00 | Stop: 2022-06-05

## 2022-06-05 MED ADMIN — cyclopentolate (CYCLOGYL) 1 % ophthalmic solution 1 drop: 1 [drp] | OPHTHALMIC | @ 14:00:00 | Stop: 2022-06-05

## 2022-06-05 MED ADMIN — midazolam (VERSED) injection: INTRAVENOUS | @ 14:00:00 | Stop: 2022-06-05

## 2022-06-05 MED ADMIN — tetracaine HCl (PF) (ALTACAINE) 0.5 %: OPHTHALMIC | @ 14:00:00 | Stop: 2022-06-05

## 2022-06-05 MED ADMIN — sodium hyaluronate (HEALON) ophthalmic injection: INTRAOCULAR | @ 14:00:00 | Stop: 2022-06-05

## 2022-06-05 MED ADMIN — balanced salt irrigation solution (BSS) ophthalmic irrigation solution: OPHTHALMIC | @ 14:00:00 | Stop: 2022-06-05

## 2022-06-05 MED ADMIN — phenylephrine (MYDFRIN) 2.5 % ophthalmic solution 1 drop: 1 [drp] | OPHTHALMIC | @ 14:00:00 | Stop: 2022-06-05

## 2022-06-05 MED ADMIN — ondansetron (ZOFRAN) injection: INTRAVENOUS | @ 14:00:00 | Stop: 2022-06-05

## 2022-06-05 MED ADMIN — tetracaine HCl (PF) (ALTACAINE) 0.5 % 1 drop: 1 [drp] | OPHTHALMIC | @ 14:00:00 | Stop: 2022-06-05

## 2022-06-05 MED ADMIN — moxifloxacin (VIGAMOX) 0.5 % ophthalmic solution: OPHTHALMIC | @ 14:00:00 | Stop: 2022-06-05

## 2022-06-05 MED ADMIN — EPINEPHrine 0.3 mg in BSS irrigation: INTRAOCULAR | @ 14:00:00 | Stop: 2022-06-05

## 2022-06-05 NOTE — Unmapped (Signed)
Operative Note  (CSN: [id number])    Date of Surgery: 06/05/2022    Pre-op Diagnosis: Visually Significant Nuclear Sclerotic Cataract, Left eye    Post-op Diagnosis: same     Procedure: Left Eye Phacoemulsification With Insertion Of Intraocular Lens    Implant:   Implant Name Type Inv. Item Serial No. Manufacturer Lot No. LRB No. Used Action   Lens Tecnis Monofocal 22.0 - [id number] Intraocular Lens Lens Tecnis Monofocal 22.0 [id number] [employer]  Left 1 Implanted       Performing Service: Ophthalmology  Surgeon(s) and Role:     * Monte Bronder Jamey Reas, MD - Primary     * Kristine Garbe, MD - Resident - Assisting    Anesthesia: Monitor Anesthesia Care + Intracameral Episugarcaine     Estimated Blood Loss: none    Complications: None    Findings: Cataract, Left Eye    Procedure:    The patient was brought into the operating room and placed supine on the operating room table, and underwent uneventful induction of monitored anesthesia care. The patient was then prepped and draped in the usual sterile fashion. A wire lid speculum was then inserted into the operative eye giving a wide palpebral fissure.    A MVR blade used to perform a paracentesis through clear cornea at the 5 o'clock limbal position. The anterior chamber was irrigated with Episugarcaine. Viscoelastic was then used to maintain the anterior chamber. A 2.60mm keratome was used to create a clear corneal incision just inside the temporal limbus. A cystotome was inserted through the main corneal wound and used to create an anterior lens capsular leaflet. Utrata forceps were then inserted through the main wound and used to create a continuous, curvilinear capsulorrhexis. Balanced salt solution was used to hydrodissect the nucleus. The phacoemulsification unit was then used to completely phacoemulsify the nucleus, following which an aspirating hand piece was used to aspirate all cortical remnants from the capsular bag. Viscoelastic was again used to reform the anterior chamber and capsular bag.    A new foldable posterior chamber intraocular lens was brought into the surgical field. It was folded and directly injected into the capsular bag following which it was centered and secure. Viscoelastic was removed with irrigation/aspiration. All wounds were hydrated and found to be watertight. The wounds remained watertight.The lid speculum was removed from the eye and a shield placed over the eye. A drop of Moxifloxacin was instilled into the conjunctival cul de sac. A shield was applied to the eye.The patient tolerated the procedure well and went to the recovery area in good condition.      Dr. Jhonnie Garner attending surgeon performed all parts of the surgery. The resident served solely as an Geophysicist/field seismologist.

## 2022-06-05 NOTE — Unmapped (Signed)
Ophthalmology Pre-Operative H&P      History of Present Illness:  81 y.o. female here today for Left eye cataract extraction with intraocular lens insertion         Allergies:  Lisinopril, Losartan, and Hctz [hydrochlorothiazide]    Medications:   Current Facility-Administered Medications   Medication Dose Route Frequency Provider Last Rate Last Admin   ??? [START ON 06/08/2022] fluPHENAZine decanoate (PROLIXIN) injection 50 mg  50 mg Intramuscular Q30 Days Lovett Calender, MD         Current Outpatient Medications   Medication Sig Dispense Refill   ??? acetaminophen (TYLENOL) 500 MG tablet Take 1 tablet (500 mg total) by mouth every six (6) hours as needed for pain.     ??? albuterol HFA 90 mcg/actuation inhaler Inhale 2 puffs every four (4) hours as needed for wheezing. (Patient taking differently: Inhale 2 puffs Two (2) times a day.) 18 g 2   ??? blood sugar diagnostic (ACCU-CHEK AVIVA PLUS TEST STRP) Strp by Other route daily at 10am. 50 strip 5   ??? blood-glucose meter kit Use to check blood sugar four times daily or as directed by MD. Dx: Diabetes, E08.00 *DISPENSE ACCU-CHEK AVIVA PLUS BRAND* 1 each 0   ??? candesartan (ATACAND) 8 MG tablet Take 0.5 tablets (4 mg total) by mouth daily. 30 tablet 11   ??? cyclobenzaprine (FLEXERIL) 5 MG tablet Take 1 tablet (5 mg total) by mouth nightly. 30 tablet 5   ??? DULoxetine (CYMBALTA) 20 MG capsule Take 1 capsule (20 mg total) by mouth daily. (Patient taking differently: Take 1 capsule (20 mg total) by mouth daily as needed.) 90 capsule 0   ??? fluPHENAZine decanoate (PROLIXIN) 25 mg/mL injection Inject 2 mL (50 mg total) into the muscle every thirty (30) days. 5 mL 11   ??? furosemide (LASIX) 20 MG tablet Take 40 mg lasix (2 tablets) daily for 5 days then return to 20 mg daily 35 tablet 0   ??? gabapentin (NEURONTIN) 100 MG capsule 1-2 pills tid prn pain and numbness 270 capsule 3   ??? inhalational spacing device (E-Z SPACER) Spcr 1 each by Miscellaneous route every four (4) hours as needed. 1 each 0   ??? lancets (ACCU-CHEK SOFTCLIX LANCETS) Misc 1 each by Miscellaneous route daily at 10am. 50 each 5   ??? MEDICAL SUPPLY ITEM Entreal formula nutritionally supplement complete caloric density 3 application 3   ??? mirtazapine (REMERON) 7.5 MG tablet Take 1 tablet (7.5 mg total) by mouth nightly. (Patient taking differently: Take 1 tablet (7.5 mg total) by mouth nightly as needed.) 90 tablet 1   ??? polyethylene glycol (MIRALAX) 17 gram packet Take 17 g by mouth daily. 30 packet 11   ??? senna (SENOKOT) 8.6 mg tablet Take 2 tablets by mouth nightly as needed for constipation. 60 tablet 0   ??? tiotropium (SPIRIVA WITH HANDIHALER) 18 mcg inhalation capsule Use once daily (Patient taking differently: Place 1 capsule (18 mcg total) into inhaler and inhale daily as needed.) 31 each 11     No medications prior to admission.       Past Medical History:   Diagnosis Date   ??? Anxiety    ??? Arthritis    ??? Breast cyst    ??? Cancer (CMS-HCC)     bladder   ??? Depression, psychotic (CMS-HCC)    ??? Diabetes mellitus (CMS-HCC)     in past   ??? Hernia    ??? History of transfusion    ???  Hypertension    ??? 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/08/2015       Past Surgical History:   Procedure Laterality Date   ??? ABDOMINAL SURGERY     ??? BLADDER SURGERY     ??? BREAST CYST EXCISION     ??? CHEMOTHERAPY  2012    bladder   ??? GALLBLADDER SURGERY      stone removal   ??? ILEOSTOMY  2012   ??? PR COLONOSCOPY FLX DX W/COLLJ SPEC WHEN PFRMD N/A 02/09/2015    Procedure: COLONOSCOPY, FLEXIBLE, PROXIMAL TO SPLENIC FLEXURE; DIAGNOSTIC, W/WO COLLECTION SPECIMEN BY BRUSH OR WASH;  Surgeon: Dewaine Conger, MD;  Location: [facility];  Service: Gastroenterology   ??? PR RECONSTR TOTAL SHOULDER IMPLANT Left 04/08/2018    Procedure: ARTHROPLASTY, GLENOHUMERAL JOINT; TOTAL SHOULDER(GLENOID & PROXIMAL HUMERAL REPLACEMENT(EG, TOTAL SHOULDER);  Surgeon: Tomasa Rand, MD;  Location: [facility] OR [facility];  Service: Ortho Sports Medicine   ??? PR SIGMOIDOSCOPY,BIOPSY N/A 03/11/2015    Procedure: SIGMOIDOSCOPY, FLEXIBLE; WITH BIOPSY, SINGLE OR MULTIPLE;  Surgeon: Wilburt Finlay, MD;  [facility];  Service: Gastroenterology       Family History:  family history includes Breast cancer (age of onset: 4) in her daughter; Diabetes in her mother; Glaucoma in her father.  There is no reported history of clotting disorders or anesthesia problems.    Social History:  Tobacco use:   reports that she has been smoking cigarettes. She has a 42.75 pack-year smoking history. She has never used smokeless tobacco.  Alcohol use:   reports no history of alcohol use.  Drug use:  reports no history of drug use.    Review of Systems:    10 point review of systems is negative other than HPI.    Objective:     Vital Signs:     No intake/output data recorded.    Physical Exam:  General: NAD.  Neuro/Psych: Alerted and Oriented x 3.  Ambulatory.  Psych: behavior wnl.  HEENT: normocephalic.  EYES: anicteric  RESP: normal work of breathing.  CV: normal heart rate.  SKIN: warm and dry.    Please page the ophthalmology consults resident or resident on call with questions.  [facility], [address] ([address], intersection [address]) can be reached at [phone].    Bishop Limbo, MD  Ophthalmology PGY-2

## 2022-06-06 ENCOUNTER — Ambulatory Visit: Admit: 2022-06-06 | Discharge: 2022-06-06 | Payer: MEDICARE

## 2022-06-06 ENCOUNTER — Ambulatory Visit
Admit: 2022-06-06 | Discharge: 2022-06-06 | Payer: MEDICARE | Attending: Student in an Organized Health Care Education/Training Program | Primary: Student in an Organized Health Care Education/Training Program

## 2022-06-06 DIAGNOSIS — Z961 Presence of intraocular lens: Principal | ICD-10-CM

## 2022-06-06 DIAGNOSIS — Z9842 Cataract extraction status, left eye: Principal | ICD-10-CM

## 2022-06-06 NOTE — Unmapped (Signed)
Optho case no post op call needed

## 2022-06-06 NOTE — Unmapped (Signed)
#.   S/p CE/PCIOL OS (06/05/2022) - POD1  - lens Lens DIB00 + 22.0   - start PF QID  - start vigamox QID  - start ketorolac BID  - shield when sleeping  - no running, jumping, heavy lifting > 10 pounds  - no swimming/dirty water in eye    RTC POW1, AutoRx    I was present with resident during the history and exam.  I discussed the findings, assessment, and plan with the resident and agree with the findings and plan as documented in the resident's note.

## 2022-06-08 ENCOUNTER — Institutional Professional Consult (permissible substitution): Admit: 2022-06-08 | Discharge: 2022-06-09 | Payer: MEDICARE

## 2022-06-08 DIAGNOSIS — F259 Schizoaffective disorder, unspecified: Principal | ICD-10-CM

## 2022-06-08 MED ADMIN — fluPHENAZine decanoate (PROLIXIN) injection 50 mg: 50 mg | INTRAMUSCULAR | @ 15:00:00

## 2022-06-08 NOTE — Unmapped (Signed)
Administered long-acting injectable today,. See MAR for additional documentation. Tifanny Dollens, CMA

## 2022-06-14 ENCOUNTER — Ambulatory Visit: Admit: 2022-06-14 | Payer: MEDICARE

## 2022-06-14 NOTE — Unmapped (Signed)
Good morning ,     I don't see in the system       Thanks  ,Riley Lam

## 2022-06-14 NOTE — Unmapped (Signed)
[facility] Administered Medication Refill Coordination Note      NAME:[name] DOB: 1941/04/15      Medication: fluphenazine    Day Supply: 28 days      SHIPPING      Next delivery from [facility] [phone]) to [facility] for Tammy Braun is scheduled for 06/21.    Clinic contact: [name]     Patient's next nurse visit for administration: 07/10.    We will follow up with clinic monthly for standard refill processing and delivery.      Jarone Ostergaard Samella Parr  Specialty Pharmacy Technician

## 2022-06-14 NOTE — Unmapped (Unsigned)
Interim History:  Tiyonna returns today for follow up of right shoulder pain. She is status post left reverse total shoulder arthroplasty and biceps tenodesis on 04/08/2018. This is doing well.  She was last seen on 02/13/2022 for right shoulder pain. She wished to avoid surgery and so we proceeded with steroid injection. This injection provided *** months of relief.    Physical Examination:  Right shoulder: Seated paralytic with inability to forward elevate past 20 degrees, abduct past 30 degrees.  Pain with passive range of motion however can get to 110 degrees forward elevation, 90 abduction.  Intact grip strength    Change in Interim Medical History: None      ROS:     Pt denies any fevers, chills, chest pain.   .   .    Imaging Studies:  {FAOZ:30865}      Assessment:   s/p left reverse total shoulder arthroplasty and biceps tenodesis on 04/08/2018, now with right shoulder pain    Plan:  Yenty continues to have right shoulder pain. ***. We will follow up ***.    DME ORDER:  Dx:  ,            Patient Profile:  Practice:  Established to provider    Plan:  {Surgical Management, Surg. Management discussed/deferred, Continue conservative management, Further work-up planned:81535}

## 2022-06-15 ENCOUNTER — Ambulatory Visit
Admit: 2022-06-15 | Discharge: 2022-06-16 | Payer: MEDICARE | Attending: Student in an Organized Health Care Education/Training Program | Primary: Student in an Organized Health Care Education/Training Program

## 2022-06-15 DIAGNOSIS — Z9842 Cataract extraction status, left eye: Principal | ICD-10-CM

## 2022-06-15 DIAGNOSIS — Z961 Presence of intraocular lens: Principal | ICD-10-CM

## 2022-06-15 NOTE — Unmapped (Signed)
#.   S/p CE/PCIOL OS (06/05/2022) - POW1  - lens Lens DIB00 + 22.0   - AutoRx SE -0.15  - c/w PF QID  - c/w ketorolac BID  - stop vigamox  - no swimming/dirty water in eye  -

## 2022-06-19 ENCOUNTER — Encounter
Admit: 2022-06-19 | Discharge: 2022-06-19 | Payer: MEDICARE | Attending: Certified Registered" | Primary: Certified Registered"

## 2022-06-19 ENCOUNTER — Ambulatory Visit: Admit: 2022-06-19 | Discharge: 2022-06-19 | Payer: MEDICARE

## 2022-06-19 MED ORDER — PREDNISOLONE ACETATE 1 % EYE DROPS,SUSPENSION
Freq: Four times a day (QID) | OPHTHALMIC | 0 refills | 50 days | Status: CP
Start: 2022-06-19 — End: ?

## 2022-06-19 MED ORDER — MOXIFLOXACIN 0.5 % EYE DROPS
Freq: Four times a day (QID) | OPHTHALMIC | 0 refills | 15 days | Status: CP
Start: 2022-06-19 — End: 2022-06-26

## 2022-06-19 MED ORDER — KETOROLAC 0.5 % EYE DROPS
Freq: Two times a day (BID) | OPHTHALMIC | 0 refills | 50 days | Status: CP
Start: 2022-06-19 — End: 2022-07-19

## 2022-06-19 MED ADMIN — EPINEPHrine 0.3 mg in BSS irrigation: INTRAOCULAR | @ 15:00:00 | Stop: 2022-06-19

## 2022-06-19 MED ADMIN — midazolam (VERSED) injection: INTRAVENOUS | @ 15:00:00 | Stop: 2022-06-19

## 2022-06-19 MED ADMIN — phenylephrine (MYDFRIN) 2.5 % ophthalmic solution 1 drop: 1 [drp] | OPHTHALMIC | @ 14:00:00 | Stop: 2022-06-19

## 2022-06-19 MED ADMIN — sodium hyaluronate (HEALON) ophthalmic injection: INTRAOCULAR | @ 15:00:00 | Stop: 2022-06-19

## 2022-06-19 MED ADMIN — ondansetron (ZOFRAN) injection: INTRAVENOUS | @ 15:00:00 | Stop: 2022-06-19

## 2022-06-19 MED ADMIN — cyclopentolate (CYCLOGYL) 1 % ophthalmic solution 1 drop: 1 [drp] | OPHTHALMIC | @ 14:00:00 | Stop: 2022-06-19

## 2022-06-19 MED ADMIN — phenylephrine (MYDFRIN) 2.5 % ophthalmic solution 1 drop: 1 [drp] | OPHTHALMIC | @ 13:00:00 | Stop: 2022-06-19

## 2022-06-19 MED ADMIN — Buffered lidocaine 0.75%/epinephrine 1:4000 (1 mg/mL) EPI-Shugarcaine 2 mL syringe: INTRACAMERAL | @ 15:00:00 | Stop: 2022-06-19

## 2022-06-19 MED ADMIN — fentaNYL (PF) (SUBLIMAZE) injection: INTRAVENOUS | @ 15:00:00 | Stop: 2022-06-19

## 2022-06-19 MED ADMIN — labetaloL (NORMODYNE,TRANDATE) injection: INTRAVENOUS | @ 15:00:00 | Stop: 2022-06-19

## 2022-06-19 MED ADMIN — tetracaine HCl (PF) (ALTACAINE) 0.5 % 1 drop: 1 [drp] | OPHTHALMIC | @ 13:00:00 | Stop: 2022-06-19

## 2022-06-19 MED ADMIN — ePHEDrine (PF) 25 mg/5 mL (5 mg/mL) in 0.9% sodium chloride syringe Syrg: INTRAVENOUS | @ 15:00:00 | Stop: 2022-06-19

## 2022-06-19 MED ADMIN — moxifloxacin (VIGAMOX) 0.5 % ophthalmic solution: OPHTHALMIC | @ 15:00:00 | Stop: 2022-06-19

## 2022-06-19 MED ADMIN — balanced salt irrigation solution (BSS) ophthalmic irrigation solution: OPHTHALMIC | @ 15:00:00 | Stop: 2022-06-19

## 2022-06-19 MED ADMIN — cyclopentolate (CYCLOGYL) 1 % ophthalmic solution 1 drop: 1 [drp] | OPHTHALMIC | @ 13:00:00 | Stop: 2022-06-19

## 2022-06-19 NOTE — Unmapped (Signed)
Operative Note  (CSN: [id number])    Date of Surgery: 06/19/2022    Pre-op Diagnosis: Visually Significant Nuclear Sclerotic Cataract, Right eye    Post-op Diagnosis: same     Procedure: Right Eye Phacoemulsification With Insertion Of Intraocular Lens    Implant:   Implant Name Type Inv. Item Serial No. Manufacturer Lot No. LRB No. Used Action   Lens Tecnis Monofocal 23.0 - [id number] Intraocular Lens Lens Tecnis Monofocal 23.0 [id number] [employer]  Right 1 Implanted       Performing Service: Ophthalmology  Surgeon(s) and Role:     * Havanah Nelms Jamey Reas, MD - Primary  Andrez Grime, MD Resident    Anesthesia: Monitor Anesthesia Care + Intracameral Episugarcaine     Estimated Blood Loss: none    Complications: None    Procedure:    The patient was brought into the operating room and placed supine on the operating room table, and underwent uneventful induction of monitored anesthesia care. The patient was then prepped and draped in the usual sterile fashion. A wire lid speculum was then inserted into the operative eye giving a wide palpebral fissure.    A MVR blade used to perform a paracentesis through clear cornea at the 11 o'clock limbal position. The anterior chamber was irrigated with Episugarcaine. Viscoelastic was then used to maintain the anterior chamber. A 2.15mm keratome was used to create a clear corneal incision just inside the temporal limbus. A cystotome was inserted through the main corneal wound and used to create an anterior lens capsular leaflet. Utrata forceps were then inserted through the main wound and used to create a continuous, curvilinear capsulorrhexis. Balanced salt solution was used to hydrodissect the nucleus. The phacoemulsification unit was then used to completely phacoemulsify the nucleus, following which an aspirating hand piece was used to aspirate all cortical remnants from the capsular bag. Viscoelastic was again used to reform the anterior chamber and capsular bag. A new foldable posterior chamber intraocular lens was brought into the surgical field. It was folded and directly injected into the capsular bag following which it was centered and secure. Viscoelastic was removed with irrigation/aspiration. All wounds were hydrated and found to be watertight. The wounds remained watertight.The lid speculum was removed from the eye and a shield placed over the eye. A drop of Moxifloxacin was instilled into the conjunctival cul de sac. A shield was applied to the eye.The patient tolerated the procedure well and went to the recovery area in good condition.    Dr. Jhonnie Garner attending surgeon participated in all parts of the surgery, the resident served as an Geophysicist/field seismologist.

## 2022-06-19 NOTE — Unmapped (Signed)
Ophthalmology Pre-Operative H&P      History of Present Illness:   81 y.o. female here today for cataract surgery right eye.         Allergies:  Lisinopril, Losartan, and Hctz [hydrochlorothiazide]    Medications:   Current Facility-Administered Medications   Medication Dose Route Frequency Provider Last Rate Last Admin   ??? fluPHENAZine decanoate (PROLIXIN) injection 50 mg  50 mg Intramuscular Q30 Days Lovett Calender, MD   50 mg at 06/08/22 1039     Current Outpatient Medications   Medication Sig Dispense Refill   ??? acetaminophen (TYLENOL) 500 MG tablet Take 1 tablet (500 mg total) by mouth every six (6) hours as needed for pain.     ??? albuterol HFA 90 mcg/actuation inhaler Inhale 2 puffs every four (4) hours as needed for wheezing. (Patient taking differently: Inhale 2 puffs Two (2) times a day.) 18 g 2   ??? blood sugar diagnostic (ACCU-CHEK AVIVA PLUS TEST STRP) Strp by Other route daily at 10am. 50 strip 5   ??? blood-glucose meter kit Use to check blood sugar four times daily or as directed by MD. Dx: Diabetes, E08.00 *DISPENSE ACCU-CHEK AVIVA PLUS BRAND* 1 each 0   ??? candesartan (ATACAND) 8 MG tablet Take 0.5 tablets (4 mg total) by mouth daily. 30 tablet 11   ??? cyclobenzaprine (FLEXERIL) 5 MG tablet Take 1 tablet (5 mg total) by mouth nightly. 30 tablet 5   ??? DULoxetine (CYMBALTA) 20 MG capsule Take 1 capsule (20 mg total) by mouth daily. (Patient taking differently: Take 1 capsule (20 mg total) by mouth daily as needed.) 90 capsule 0   ??? fluPHENAZine decanoate (PROLIXIN) 25 mg/mL injection Inject 2 mL (50 mg total) into the muscle every thirty (30) days. 5 mL 11   ??? furosemide (LASIX) 20 MG tablet Take 40 mg lasix (2 tablets) daily for 5 days then return to 20 mg daily 35 tablet 0   ??? gabapentin (NEURONTIN) 100 MG capsule 1-2 pills tid prn pain and numbness 270 capsule 3   ??? inhalational spacing device (E-Z SPACER) Spcr 1 each by Miscellaneous route every four (4) hours as needed. 1 each 0   ??? ketorolac (ACULAR) 0.5 % ophthalmic solution Administer 1 drop into the left eye two (2) times a day. 5 mL 0   ??? lancets (ACCU-CHEK SOFTCLIX LANCETS) Misc 1 each by Miscellaneous route daily at 10am. 50 each 5   ??? MEDICAL SUPPLY ITEM Entreal formula nutritionally supplement complete caloric density 3 application 3   ??? mirtazapine (REMERON) 7.5 MG tablet Take 1 tablet (7.5 mg total) by mouth nightly. (Patient taking differently: Take 1 tablet (7.5 mg total) by mouth nightly as needed.) 90 tablet 1   ??? moxifloxacin (VIGAMOX) 0.5 % ophthalmic solution Administer 1 drop into the left eye four (4) times a day for 7 days. 3 mL 0   ??? polyethylene glycol (MIRALAX) 17 gram packet Take 17 g by mouth daily. 30 packet 11   ??? prednisoLONE acetate (PRED FORTE) 1 % ophthalmic suspension Administer 1 drop into the left eye four (4) times a day. 10 mL 0   ??? senna (SENOKOT) 8.6 mg tablet Take 2 tablets by mouth nightly as needed for constipation. 60 tablet 0   ??? tiotropium (SPIRIVA WITH HANDIHALER) 18 mcg inhalation capsule Use once daily (Patient taking differently: Place 1 capsule (18 mcg total) into inhaler and inhale daily as needed.) 31 each 11     No medications prior  to admission.       Past Medical History:   Diagnosis Date   ??? Anxiety    ??? Arthritis    ??? Breast cyst    ??? Cancer (CMS-HCC)     bladder   ??? Depression, psychotic (CMS-HCC)    ??? Diabetes mellitus (CMS-HCC)     in past   ??? Hernia    ??? History of transfusion    ??? Hypertension    ??? 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/08/2015       Past Surgical History:   Procedure Laterality Date   ??? ABDOMINAL SURGERY     ??? BLADDER SURGERY     ??? BREAST CYST EXCISION     ??? CHEMOTHERAPY  2012    bladder   ??? GALLBLADDER SURGERY      stone removal   ??? ILEOSTOMY  2012   ??? PR COLONOSCOPY FLX DX W/COLLJ SPEC WHEN PFRMD N/A 02/09/2015    Procedure: COLONOSCOPY, FLEXIBLE, PROXIMAL TO SPLENIC FLEXURE; DIAGNOSTIC, W/WO COLLECTION SPECIMEN BY BRUSH OR WASH;  Surgeon: Dewaine Conger, MD;  Location: [facility];  Service: Gastroenterology   ??? PR RECONSTR TOTAL SHOULDER IMPLANT Left 04/08/2018    Procedure: ARTHROPLASTY, GLENOHUMERAL JOINT; TOTAL SHOULDER(GLENOID & PROXIMAL HUMERAL REPLACEMENT(EG, TOTAL SHOULDER);  Surgeon: Tomasa Rand, MD;  Location: [facility];  Service: Ortho Sports Medicine   ??? PR SIGMOIDOSCOPY,BIOPSY N/A 03/11/2015    Procedure: SIGMOIDOSCOPY, FLEXIBLE; WITH BIOPSY, SINGLE OR MULTIPLE;  Surgeon: Wilburt Finlay, MD;  [facility]: [facility];  Service: Gastroenterology   ??? PR XCAPSL CTRC RMVL INSJ IO LENS PROSTH W/O ECP Left 06/05/2022    Procedure: EXTRACAPSULAR CATARACT REMOVAL W/INSERTION OF INTRAOCULAR LENS PROSTHESIS, MANUAL OR MECHANICAL TECHNIQUE WITHOUT ENDOSCOPIC CYCLOPHOTOCOAGULATION;  Surgeon: Garner Gavel, MD;  Location[facility];  Service: Ophthalmology       Family History:  family history includes Breast cancer (age of onset: 13) in her daughter; Diabetes in her mother; Glaucoma in her father.  There is no reported history of clotting disorders or anesthesia problems.    Social History:  Tobacco use:   reports that she has been smoking cigarettes. She has a 42.75 pack-year smoking history. She has never used smokeless tobacco.  Alcohol use:   reports no history of alcohol use.  Drug use:  reports no history of drug use.    Review of Systems:    10 point review of systems is negative other than HPI.    Objective:     Vital Signs:     No intake/output data recorded.    Physical Exam:  General: NAD.  Neuro/Psych: Alerted and Oriented x 3.  Ambulatory.  Psych: behavior wnl.  HEENT: normocephalic.  EYES: anicteric.  RESP: normal work of breathing.  CV: normal heart rate.  SKIN: warm and dry.    Please page the ophthalmology consults resident or resident on call with questions.

## 2022-06-20 ENCOUNTER — Ambulatory Visit
Admit: 2022-06-20 | Discharge: 2022-06-21 | Payer: MEDICARE | Attending: Student in an Organized Health Care Education/Training Program | Primary: Student in an Organized Health Care Education/Training Program

## 2022-06-20 DIAGNOSIS — Z961 Presence of intraocular lens: Principal | ICD-10-CM

## 2022-06-20 DIAGNOSIS — Z9841 Cataract extraction status, right eye: Principal | ICD-10-CM

## 2022-06-20 MED FILL — FLUPHENAZINE DECANOATE 25 MG/ML INJECTION SOLUTION: INTRAMUSCULAR | 28 days supply | Qty: 5 | Fill #3

## 2022-06-20 NOTE — Unmapped (Signed)
Optho case no post op call needed

## 2022-06-20 NOTE — Unmapped (Signed)
#.   S/p CE/PCIOL OD (06/19/2022) - POD1  - lens DIB00 +23.0  - start PF QID  - start vigamox QID  - start ketorolac BID  - shield when sleeping  - no running, jumping, heavy lifting > 10 pounds  - no swimming/dirty water in eye    RTC POW1, AutoRx    #. S/p CE/PCIOL OS (06/05/2022)  - lens Lens DIB00 + 22.0   - AutoRx SE -0.15  - c/w PF QID  - c/w ketorolac BID    I was present with resident during the history and exam.  I discussed the findings, assessment, and plan with the resident and agree with the findings and plan as documented in the resident's note.

## 2022-06-21 NOTE — Unmapped (Signed)
Hi,     Hi, you give the Rx to a nurse to fax over because I haven't seen anything .       Thanks, [employer]

## 2022-06-26 NOTE — Unmapped (Signed)
Form placed in the provider's box         Tammy Braun

## 2022-06-27 ENCOUNTER — Ambulatory Visit
Admit: 2022-06-27 | Discharge: 2022-06-28 | Payer: MEDICARE | Attending: Student in an Organized Health Care Education/Training Program | Primary: Student in an Organized Health Care Education/Training Program

## 2022-06-27 DIAGNOSIS — Z961 Presence of intraocular lens: Principal | ICD-10-CM

## 2022-06-27 NOTE — Unmapped (Signed)
#.   S/p CE/PCIOL OD (06/19/2022) - POW1  - lens DIB00 +23.0  - AutoRx SE   - c/w PF QID  - c/w ketorolac BID  - stop vigamox  - no swimming/dirty water in eye    RTC 3 weeks DFE OU, MRx    #. S/p CE/PCIOL OS (06/05/2022)  - lens Lens DIB00 + 22.0   - AutoRx SE -0.15

## 2022-06-28 NOTE — Unmapped (Signed)
Reason for Disposition  ??? Second attempt to contact caller AND no contact made. Phone number verified.    Answer Assessment - Initial Assessment Questions  N/A  Ransom attempted x4. VM full    Protocols used: No Contact or Duplicate Contact Call-A-AH

## 2022-06-28 NOTE — Unmapped (Signed)
Regarding: Numbness all over  ----- Message from Heath Lark sent at 06/28/2022  7:26 AM EDT -----  TX-Numbness all over, sore on toe

## 2022-06-29 ENCOUNTER — Ambulatory Visit: Admit: 2022-06-29 | Discharge: 2022-06-30 | Payer: MEDICARE

## 2022-06-29 MED ORDER — DICLOFENAC 1 % TOPICAL GEL
Freq: Four times a day (QID) | TOPICAL | 3 refills | 25 days | Status: CP
Start: 2022-06-29 — End: 2023-06-29

## 2022-06-29 MED ORDER — CANDESARTAN 16 MG TABLET
ORAL_TABLET | Freq: Every day | ORAL | 11 refills | 30 days | Status: CP
Start: 2022-06-29 — End: 2023-06-29

## 2022-06-29 NOTE — Unmapped (Signed)
[facility]    Patient ID: Tammy Braun is a 81 y.o. female who presents for {AJM Reason for Visit:25429}  PCP: Evette Cristal, MD      Informant: {INFORMANT with relatives:23352}    Assessment/Plan:      ***  {TIP - HCC- RAFF Pilot- Clinical Documentation Specialist Recommendations-  5 daughters (one died angela)   1 son died  Main Contact [phone]  Pattricia Boss [phone] Oldest sister  Delice Bison [phone]  12 grandchild  5 greatgrand   This text will self delete upon signing note:75688}         Preventive services addressed today  {AJM Preventive Services:41504}    {AJM PCMH Review:30708::-- Patient verbalized an understanding of today's assessment and recommendations, as well as the purpose of ongoing medications.}    I personally spent *** minutes face-to-face and non-face-to-face in the care of this patient, which includes all pre, intra, and post visit time on the date of service.    No follow-ups on file.    Subjective:     Chief Complaint   Patient presents with    Nail Problem       HPI  # Toe sore  - Noticed it 2 days ago  - Doesn't hurt  - No fevers or chills    ROS  As per HPI.    RELEVANT PAST MEDICAL, FAMILY, SURGICAL, SOCIAL HISTORY  COPD  DM2    ___________________________________  CURRENT MEDS  Current Outpatient Medications   Medication Sig Dispense Refill    acetaminophen (TYLENOL) 500 MG tablet Take 1 tablet (500 mg total) by mouth every six (6) hours as needed for pain.      albuterol HFA 90 mcg/actuation inhaler Inhale 2 puffs every four (4) hours as needed for wheezing. (Patient taking differently: Inhale 2 puffs Two (2) times a day.) 18 g 2    blood sugar diagnostic (ACCU-CHEK AVIVA PLUS TEST STRP) Strp by Other route daily at 10am. 50 strip 5    blood-glucose meter kit Use to check blood sugar four times daily or as directed by MD. Dx: Diabetes, E08.00 *DISPENSE ACCU-CHEK AVIVA PLUS BRAND* 1 each 0    candesartan (ATACAND) 8 MG tablet Take 0.5 tablets (4 mg total) by mouth daily. 30 tablet 11    cyclobenzaprine (FLEXERIL) 5 MG tablet Take 1 tablet (5 mg total) by mouth nightly. 30 tablet 5    DULoxetine (CYMBALTA) 20 MG capsule Take 1 capsule (20 mg total) by mouth daily. (Patient taking differently: Take 1 capsule (20 mg total) by mouth daily as needed.) 90 capsule 0    fluPHENAZine decanoate (PROLIXIN) 25 mg/mL injection Inject 2 mL (50 mg total) into the muscle every thirty (30) days. 5 mL 11    furosemide (LASIX) 20 MG tablet Take 40 mg lasix (2 tablets) daily for 5 days then return to 20 mg daily 35 tablet 0    gabapentin (NEURONTIN) 100 MG capsule 1-2 pills tid prn pain and numbness 270 capsule 3    inhalational spacing device (E-Z SPACER) Spcr 1 each by Miscellaneous route every four (4) hours as needed. 1 each 0    ketorolac (ACULAR) 0.5 % ophthalmic solution Administer 1 drop to the right eye two (2) times a day. 5 mL 0    lancets (ACCU-CHEK SOFTCLIX LANCETS) Misc 1 each by Miscellaneous route daily at 10am. 50 each 5    MEDICAL SUPPLY ITEM Entreal formula nutritionally supplement complete caloric density 3 application 3  mirtazapine (REMERON) 7.5 MG tablet Take 1 tablet (7.5 mg total) by mouth nightly. (Patient taking differently: Take 1 tablet (7.5 mg total) by mouth nightly as needed.) 90 tablet 1    polyethylene glycol (MIRALAX) 17 gram packet Take 17 g by mouth daily. 30 packet 11    prednisoLONE acetate (PRED FORTE) 1 % ophthalmic suspension Administer 1 drop to the right eye four (4) times a day. 10 mL 0    senna (SENOKOT) 8.6 mg tablet Take 2 tablets by mouth nightly as needed for constipation. 60 tablet 0    tiotropium (SPIRIVA WITH HANDIHALER) 18 mcg inhalation capsule Use once daily (Patient taking differently: Place 1 capsule (18 mcg total) into inhaler and inhale daily as needed.) 31 each 11     Current Facility-Administered Medications   Medication Dose Route Frequency Provider Last Rate Last Admin    fluPHENAZine decanoate (PROLIXIN) injection 50 mg  50 mg Intramuscular Q30 Days Lovett Calender, MD   50 mg at 06/08/22 1039       ___________________________________  ALLERGIES  Allergies   Allergen Reactions    Lisinopril Anaphylaxis and Swelling    Losartan Dizziness    Hctz [Hydrochlorothiazide]      SIADH         Objective:       Vital Signs  BP 154/100 Comment: Average Bp - Pulse 86  - Temp 35.3 ??C (95.5 ??F) (Temporal)  - Wt 57.2 kg (126 lb 3.2 oz)  - BMI 19.77 kg/m??      Exam  Physical Exam     Data  *** person, place and time.          Data  Lab Results   Component Value Date    A1C 6.6 04/11/2022

## 2022-06-29 NOTE — Unmapped (Signed)
Form placed in the provider's box         Ceejay Kegley

## 2022-07-09 ENCOUNTER — Institutional Professional Consult (permissible substitution): Admit: 2022-07-09 | Discharge: 2022-07-10 | Payer: MEDICARE

## 2022-07-09 DIAGNOSIS — F259 Schizoaffective disorder, unspecified: Principal | ICD-10-CM

## 2022-07-09 MED ADMIN — fluPHENAZine decanoate (PROLIXIN) injection 50 mg: 50 mg | INTRAMUSCULAR | @ 15:00:00

## 2022-07-09 NOTE — Unmapped (Signed)
Administered long-acting injectable today. See MAR for additional documentation. Kyro Joswick, CMA

## 2022-07-10 ENCOUNTER — Ambulatory Visit: Admit: 2022-07-10 | Discharge: 2022-07-11 | Payer: MEDICARE

## 2022-07-10 DIAGNOSIS — I779 Disorder of arteries and arterioles, unspecified: Principal | ICD-10-CM

## 2022-07-10 DIAGNOSIS — Z13820 Encounter for screening for osteoporosis: Principal | ICD-10-CM

## 2022-07-10 DIAGNOSIS — E1149 Type 2 diabetes mellitus with other diabetic neurological complication: Principal | ICD-10-CM

## 2022-07-10 DIAGNOSIS — J438 Other emphysema: Principal | ICD-10-CM

## 2022-07-10 DIAGNOSIS — M4852XA Collapsed vertebra, not elsewhere classified, cervical region, initial encounter for fracture: Principal | ICD-10-CM

## 2022-07-10 DIAGNOSIS — I10 Essential (primary) hypertension: Principal | ICD-10-CM

## 2022-07-10 NOTE — Unmapped (Signed)
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 [phone]  Fax. [phone]    Physical Therapist requesting DME.  Transfer bench for shower and  [employer].   fax [phone].     Updated problem list annotations  Database review and update  Med review and update  Lab review and update  Counseling provided and patient education    40 minutes with patient- 1/2 in face to face counseling on chronic health conditions and  mobility    SUBJECTIVE:    Tammy Braun is a 81 y.o. female that presents to clinic today regarding the following issues:    Hospitalized with abd pain 5/11-5/14 2023  Vomited blood?  Had new onset right upper quadrant abdominal pain with mild epigastric pain, and brown sputum production - said she had pyelonephritis and given iv antibiotics  outpatient for UTI on 3/10 with Bactrim, sx resolved. On admission, UA +LE, Nitrite concerning for UTI. CT A/P notable for right hydroureteronephrosis w/ urothelial enhancement suggestive of pyelitis and new left mild hydronephrosis. Received 3 doses of CTX and was then transitioned to ciprofloxacin of which she will take for 4 more days. Had CTA that was negative for PE, but did show enlargement of the right cardiac chambers and main pulmonary artery suggestive of pulmonary arterial hypertension. UTI showed 50,000 to 100,000 CFU/mL Pseudomonas aeruginosa.      Blister on foot- better  ? Cig butt burn    81 y.o. female with parastomal pain status post neoadjuvant chemotherapy and radical cystectomy with Ileal Conduit on 10/05/2011 for T2N0M0 bladder cancer.     PMH Chronic issues, including: COPD, Neuropathy, Chronic pain, HTN, DJD neck, hip, cervical stenosis, cervical radiculopathy, Chronic renal disease, Depression , Onychomycosis, Emphysema, Ca Trigone Bladder, Cervical Myofascial pain with chronic tension headaches with medication overuse headaches, left reverse total shoulder arthroplasty, large hiatal hernia, Pyelonephritis, Kidney stones       Had cataract surgery both eyes  helped     reports that she has been smoking cigarettes. She has a 42.75 pack-year smoking history. She has never used smokeless tobacco.    The following portions of the patient's history were reviewed and updated as appropriate: allergies, current medications, past family history, past medical history, past social history, past surgical history and problem list.    Review of systems for 10 organ systems conducted and no significant positives      OBJECTIVE:    Vital signs have been reviewed:   Vitals:    07/10/22 1012   BP: 128/80  Pulse: 76   Temp: 36.1 ??C (97 ??F)     Weakness arms and shoulders 3-4/5 symmetrical  Legs 3-4+/5 symmetrical  Balance not great    Lungs cta  Cv rsr  Abd soft

## 2022-07-10 NOTE — Unmapped (Signed)
Form faxed back to # [phone]    [facility]

## 2022-07-11 NOTE — Unmapped (Signed)
[facility] Administered Medication Refill Coordination Note      NAME:Keigan Niyla Marone DOB: 1941-03-24      Medication: fluphenazine    Day Supply: 28 days      SHIPPING      Next delivery from [facility] [phone]) to  [facility]  for Smrithi Pigford is scheduled for 07/26.    Clinic contact: [name]     Patient's next nurse visit for administration: 08/09.    We will follow up with clinic monthly for standard refill processing and delivery.      Vinny Taranto Samella Parr  Specialty Pharmacy Technician

## 2022-07-18 ENCOUNTER — Ambulatory Visit
Admit: 2022-07-18 | Discharge: 2022-07-19 | Payer: MEDICARE | Attending: Student in an Organized Health Care Education/Training Program | Primary: Student in an Organized Health Care Education/Training Program

## 2022-07-18 DIAGNOSIS — Z961 Presence of intraocular lens: Principal | ICD-10-CM

## 2022-07-18 NOTE — Unmapped (Signed)
#.   S/p CE/PCIOL OD (06/19/2022) - POM1  - lens DIB00 +23.0  - Mrx provided      #. S/p CE/PCIOL OS (06/05/2022)  - lens Lens DIB00 + 22.0   - AutoRx SE -0.15    RTC 1 year [address]

## 2022-07-19 NOTE — Unmapped (Signed)
Patient daughter [name] Ph. [phone] states that patient case manager is requesting medical summary from PCP for insure prescriptions. [name] is requesting a call back from provider's nurse.

## 2022-07-20 NOTE — Unmapped (Signed)
Unable to leave voicemail due to mailbox is full.

## 2022-07-20 NOTE — Unmapped (Signed)
Spoke with daughter glenda. [facility] is in need or a another prescription for patient's ensure and need a medical summary. Office note with prescription faxed to [facility] in [place]. [phone]

## 2022-07-24 ENCOUNTER — Ambulatory Visit: Admit: 2022-07-24 | Discharge: 2022-07-25 | Payer: MEDICARE

## 2022-07-24 MED ORDER — ACETAMINOPHEN 500 MG TABLET
ORAL_TABLET | Freq: Four times a day (QID) | ORAL | 2 refills | 13 days | Status: CP | PRN
Start: 2022-07-24 — End: 2023-07-24

## 2022-07-24 NOTE — Unmapped (Signed)
Patient Advice Request     Patient Name: Tammy Braun  Caller: Self (Patient)  Contact Method: Telephone Call: Time- Any Time [phone]   Reason for Call: please send forms back  Previously Discussed: no  Appointment Offered: No

## 2022-07-24 NOTE — Unmapped (Signed)
ORTHOPAEDIC NEW CLINIC NOTE     ASSESSMENT:  Tammy Braun is a 81 y.o. female with:  1. Chronic R shoulder pain  2. Chronic L arm pain    PLAN:  Patient presents with ongoing bilateral shoulder pain s/p L rTSA in 2019 and R shoulder injection 6 months ago. Discussed again the management options for her R shoulder pain, including pain management, PT, injections, and surgery. Patient still insists against R shoulder rTSA for now and would like to proceed with pain management instead. Also informed patient her L arm pain appears to be downstream from the shoulder itself, and appears to be part of a complex pain picture including lower extremity symptoms which she has discussed both at this in prior visits.  Encouraged patient to discuss global pain management with her primary doctor. Will plan to prescribe Tylenol for patient's R shoulder pain given patient does not wish to proceed with surgery.      SUBJECTIVE:  Chief Complaint:   chronic bilateral shoulder pain    History of Present Illness:   81 y.o. female who presents for follow-up of chronic bilateral shoulder pain. Hx of L rTSA in 2019. Last seen in February 2023, when she was given a steroid injection in R shoulder and instructed to continue PT to work on strength and ROM.  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    Patient reports ongoing R shoulder pain. States the pain improved for 2-3 months following previous injection but has since recurred. She is seen by home PT two times per week for R shoulder exercises.    Also endorses L arm pain that started 6 months ago.  Pain begins just below the level of the shoulder mid brachium and radiates distally.  Has appropriate soreness both along the distal brachium and forearm.  Denies L shoulder pain, but states L arm is painful and sore throughout otherwise.      Medical History  Past Medical History:   Diagnosis Date   ??? Anxiety    ??? Arthritis    ??? Breast cyst    ??? Cancer (CMS-HCC)     bladder ??? Depression, psychotic (CMS-HCC)    ??? Diabetes mellitus (CMS-HCC)     in past   ??? Hernia    ??? History of transfusion    ??? Hypertension    ??? 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 ABDOMINAL SURGERY     ??? BLADDER SURGERY     ??? BREAST CYST EXCISION     ??? CHEMOTHERAPY  2012    bladder   ??? GALLBLADDER SURGERY      stone removal   ??? ILEOSTOMY  2012   ??? PR COLONOSCOPY FLX DX W/COLLJ SPEC WHEN PFRMD N/A 02/09/2015    Procedure: COLONOSCOPY, FLEXIBLE, PROXIMAL TO SPLENIC FLEXURE; DIAGNOSTIC, W/WO COLLECTION SPECIMEN BY BRUSH OR WASH;  Surgeon: Dewaine Conger, MD;  Location: [facility];  Service: Gastroenterology   ??? PR RECONSTR TOTAL SHOULDER IMPLANT Left 04/08/2018    Procedure: ARTHROPLASTY, GLENOHUMERAL JOINT; TOTAL SHOULDER(GLENOID & PROXIMAL HUMERAL REPLACEMENT(EG, TOTAL SHOULDER);  Surgeon: Tomasa Rand, MD;  Location[facility];  Service: Ortho Sports Medicine   ??? PR SIGMOIDOSCOPY,BIOPSY N/A 03/11/2015    Procedure: SIGMOIDOSCOPY, FLEXIBLE; WITH BIOPSY, SINGLE OR MULTIPLE;  Surgeon: Wilburt Finlay, MD;  [facility];  Service: Gastroenterology   ???  PR XCAPSL CTRC RMVL INSJ IO LENS PROSTH W/O ECP Left 06/05/2022    Procedure: EXTRACAPSULAR CATARACT REMOVAL W/INSERTION OF INTRAOCULAR LENS PROSTHESIS, MANUAL OR MECHANICAL TECHNIQUE WITHOUT ENDOSCOPIC CYCLOPHOTOCOAGULATION;  Surgeon: Garner Gavel, MD;  Location: [facility] OR [facility];  Service: Ophthalmology   ??? PR XCAPSL CTRC RMVL INSJ IO LENS PROSTH W/O ECP Right 06/19/2022    Procedure: EXTRACAPSULAR CATARACT REMOVAL W/INSERTION OF INTRAOCULAR LENS PROSTHESIS, MANUAL OR MECHANICAL TECHNIQUE WITHOUT ENDOSCOPIC CYCLOPHOTOCOAGULATION;  Surgeon: Garner Gavel, MD;  Location: [facility];  Service: Ophthalmology    Medications  has a current medication list which includes the following prescription(s): acetaminophen, albuterol, blood sugar diagnostic, blood-glucose meter, candesartan, cyclobenzaprine, diclofenac sodium, duloxetine, fluphenazine decanoate, furosemide, gabapentin, inhalational spacing device, lancets, MEDICAL SUPPLY ITEM, mirtazapine, polyethylene glycol, prednisolone acetate, senna, and tiotropium, and the following Facility-Administered Medications: fluphenazine decanoate.   Tobacco/Alcohol History  Social History     Tobacco Use   Smoking Status Every Day   ??? Packs/day: 0.75   ??? Years: 57.00   ??? Pack years: 42.75   ??? Types: Cigarettes   Smokeless Tobacco Never   Tobacco Comments    15cpd     Social History     Substance and Sexual Activity   Alcohol Use No   ??? Alcohol/week: 0.0 standard drinks    Social History        Occupational History   ??? Not on file       Home Address:  [address] [place] [address] Family History  Family History   Problem Relation Age of Onset   ??? Breast cancer Daughter 31   ??? Diabetes Mother    ??? Glaucoma Father    ??? Colon cancer Neg Hx    ??? Ovarian cancer Neg Hx    ??? Endometrial cancer Neg Hx    ??? Anesthesia problems Neg Hx    ??? Bleeding Disorder Neg Hx         Allergies   Lisinopril, Losartan, and Hctz [hydrochlorothiazide]       Review of Systems  .   Marland Kitchen  No chest pain, dyspnea, nausea, fevers, chills       DETAILED PHYSICAL EXAM  General Appearance ?? well-nourished and no acute distress   Mood and Affect ?? alert, cooperative and pleasant   Cardiovascular ?? well-perfused distally and no swelling   Sensation ?? Sensation intact to light touch distally      Right Left   Shoulder   ?? Inspection/Tenderness: No swelling, erythema, deformity, atrophy or hypertrophy. No tenderness throughout shoulder girdle.  ?? Range of motion (passive): FE 60 (passive to 90), ER 45 (passive to 60), IR testing deferred due to limitations, Abduction 45  ?? Apprehension: n/a  ?? Provocative Tests: deferred due to limitations with ROM ?? Inspection: No swelling, erythema, deformity, atrophy or hypertrophy.  ?? Range of motion: FE 120, ER 60, IR testing deferred due to limitations, Abduction 70   ?? Apprehension: n/a  ?? Provocative Tests / Tenderness:  deferred        Test Results  Imaging    No new imaging.     XR of R shoulder on 02/07/22 showing mild degenerative changes with osteophytosis and narrowing of the acromiohumeral joint space with preserved glenohumeral joint space. Cystic changes of the greater tuberosity demonstrated suggesting rotator cuff tendinopathy.      DME ORDER:  Dx:  ,  Patient Profile:  Practice:  established to provider    Plan:  continue conservative management

## 2022-07-25 ENCOUNTER — Ambulatory Visit: Admit: 2022-07-25 | Discharge: 2022-07-26 | Payer: MEDICARE

## 2022-07-25 DIAGNOSIS — I10 Essential (primary) hypertension: Principal | ICD-10-CM

## 2022-07-25 MED ORDER — CANDESARTAN 32 MG TABLET
ORAL_TABLET | Freq: Every day | ORAL | 11 refills | 30 days | Status: CP
Start: 2022-07-25 — End: 2023-07-25

## 2022-07-25 MED FILL — FLUPHENAZINE DECANOATE 25 MG/ML INJECTION SOLUTION: INTRAMUSCULAR | 28 days supply | Qty: 5 | Fill #4

## 2022-07-25 NOTE — Unmapped (Signed)
DIVISION OF CARDIOLOGY   [facility], [place]        Date of Service: 07/25/2022    Assessment/Plan     1. PVCs -sinus rhythm with occasional PVC's    2. Hypertension -her blood pressure today is 140/106. We will continue current medications including Lasix and amlodipine 10mg . Start candesartan 8 mg. Advised patient to monitor BP at home and maintain a log.     3. Tobacco use disorder - currently smoking 1 pack a day, increased from 5 cigarettes a day. Complete cessation encouraged.     4. Moderate/Severe Tricuspid Regurgitation - currently asymptomatic without evidence of volume overload.  We will continue to monitor.    5. R Rotator cuff Braun- follow-up with PCP.    Return to clinic:    No follow-ups on file.      Subjective:   Tammy [name], MD     Chief complaint: f/u of PVCs    History of Present Illness:  Tammy Braun is a 81 y.o. female with a history of bladder cancer s/p bladder removal w/ ileal-conduit with urostomy, emphysema, hypertension, tobacco abuse and hiatal hernia who presents for routine f/up.      Tammy Braun recently established care after hospitalization at [facility]. She was admitted for significant hyponatremia in the setting of hydrochlorothiazide and poor p.o. intake. Was noted to have very frequent ectopy. Nuclear stress test negative for ischemia. Echo w/ normal LVSF, severe PH, mod-sev TR. Ziopatch results during hospital stay showed 19% PVCs that were symptomatic (she felt dizzy and lightheaded during that time). She had recurrence of episodes of lightheadedness/dizziness and EKG 09/21/20 showed recurrence of PVCs in bigeminy. Her daughter felt that it was due to patient taking Tammy Braun powders again for HA and smoking. She states that she took these away from her after hospitalization and thinks that made the PVCs initially improve.     Tammy Braun returns for follow-up today in clinic. Latest echo on 10/25/21 showed left ventricular systolic function is normal, LVEF is visually estimated at > 55%. She notes she has had some R arm Braun that prevents her from moving her shoulder. She states that she experiences DOE, when she walks. She usually uses her inhaler when she walks. Her dizziness has resolved with amlodipine.     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)    Schizoaffective disorder (CMS-HCC)    Onychomycosis of multiple toenails with type 2 diabetes mellitus and peripheral neuropathy (CMS-HCC)    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    Risk for falls    Right hip Braun    Tinea versicolor    Disorder of left rotator cuff    Spinal stenosis of cervical region    Left shoulder Braun    Articular cartilage disorder of shoulder region, left    Tinnitus    Headache  Bradycardia    Skin breakdown    Influenza vaccination declined    Chronic tension-type headache, not intractable    Osteoarthritis of cervical spine    Arthralgia of both ankles    Frequent PVCs    Carotid arterial disease (CMS-HCC) < 40% bilaterally 2021    Primary hypertension    Nonrheumatic tricuspid valve regurgitation    Pulmonary HTN (CMS-HCC)    Pyelitis       Medications:  Current Outpatient Medications   Medication Sig Dispense Refill    acetaminophen (TYLENOL EXTRA STRENGTH) 500 MG tablet Take 2 tablets (1,000 mg total) by mouth every six (6) hours as needed for Braun. 100 tablet 2    acetaminophen (TYLENOL) 500 MG tablet Take 1 tablet (500 mg total) by mouth every six (6) hours as needed for Braun.      albuterol HFA 90 mcg/actuation inhaler Inhale 2 puffs every four (4) hours as needed for wheezing. (Patient taking differently: Inhale 2 puffs Two (2) times a day.) 18 g 2    blood sugar diagnostic (ACCU-CHEK AVIVA PLUS TEST STRP) Strp by Other route daily at 10am. 50 strip 5    blood-glucose meter kit Use to check blood sugar four times daily or as directed by MD. Dx: Diabetes, E08.00 *DISPENSE ACCU-CHEK AVIVA PLUS BRAND* 1 each 0    cyclobenzaprine (FLEXERIL) 5 MG tablet Take 1 tablet (5 mg total) by mouth nightly. 30 tablet 5    DULoxetine (CYMBALTA) 20 MG capsule Take 1 capsule (20 mg total) by mouth daily. (Patient taking differently: Take 1 capsule (20 mg total) by mouth daily as needed.) 90 capsule 0    fluPHENAZine decanoate (PROLIXIN) 25 mg/mL injection Inject 2 mL (50 mg total) into the muscle every thirty (30) days. 5 mL 11    furosemide (LASIX) 20 MG tablet Take 40 mg lasix (2 tablets) daily for 5 days then return to 20 mg daily 35 tablet 0    inhalational spacing device (E-Z SPACER) Spcr 1 each by Miscellaneous route every four (4) hours as needed. 1 each 0    lancets (ACCU-CHEK SOFTCLIX LANCETS) Misc 1 each by Miscellaneous route daily at 10am. 50 each 5    MEDICAL SUPPLY ITEM Entreal formula nutritionally supplement complete caloric density 3 application 3    mirtazapine (REMERON) 7.5 MG tablet Take 1 tablet (7.5 mg total) by mouth nightly. (Patient taking differently: Take 1 tablet (7.5 mg total) by mouth nightly as needed.) 90 tablet 1    polyethylene glycol (MIRALAX) 17 gram packet Take 17 g by mouth daily. 30 packet 11    tiotropium (SPIRIVA WITH HANDIHALER) 18 mcg inhalation capsule Use once daily (Patient taking differently: Place 1 capsule (18 mcg total) into inhaler and inhale daily as needed.) 31 each 11    candesartan (ATACAND) 16 MG tablet Take 1 tablet (16 mg total) by mouth daily. (Patient not taking: Reported on 07/25/2022) 30 tablet 11    diclofenac sodium (VOLTAREN) 1 % gel Apply 2 g topically four (4) times a day. Approved Indication(s): Osteoarthritis of Knees (Patient not taking: Reported on 07/25/2022) 200 g 3    gabapentin (NEURONTIN) 100 MG capsule 1-2 pills tid prn Braun and numbness (Patient not taking: Reported on 07/25/2022) 270 capsule 3    prednisoLONE acetate (PRED FORTE) 1 % ophthalmic suspension Administer 1 drop to the right eye four (4) times a day. (Patient not taking: Reported on 07/25/2022) 10 mL 0    senna (SENOKOT) 8.6 mg tablet Take  2 tablets by mouth nightly as needed for constipation. (Patient not taking: Reported on 07/25/2022) 60 tablet 0     Current Facility-Administered Medications   Medication Dose Route Frequency Provider Last Rate Last Admin    fluPHENAZine decanoate (PROLIXIN) injection 50 mg  50 mg Intramuscular Q30 Days Lovett Calender, MD   50 mg at 07/09/22 1109       Allergies  Allergies   Allergen Reactions    Lisinopril Anaphylaxis and Swelling    Losartan Dizziness    Hctz [Hydrochlorothiazide]      SIADH       Social History:   Social History     Tobacco Use    Smoking status: Every Day     Packs/day: 0.75     Years: 57.00     Pack years: 42.75     Types: Cigarettes    Smokeless tobacco: Never    Tobacco comments:     15cpd   Vaping Use    Vaping Use: Never used   Substance Use Topics    Alcohol use: No     Alcohol/week: 0.0 standard drinks    Drug use: No       Family History:  Family History   Problem Relation Age of Onset    Breast cancer Daughter 31    Diabetes Mother     Glaucoma Father     Colon cancer Neg Hx     Ovarian cancer Neg Hx     Endometrial cancer Neg Hx     Anesthesia problems Neg Hx     Bleeding Disorder Neg Hx        ROS- 12 system review is negative other than what is specified in the History of Present Illness.      Objective:   Physical Exam  Vitals:    07/25/22 1157   BP: 133/72   BP Site: L Arm   BP Position: Sitting   BP Cuff Size: Large   Pulse: 62   SpO2: 97% Weight: 57.7 kg (127 lb 3.2 oz)   Height: 170.2 cm (5' 7)     Wt Readings from Last 3 Encounters:   07/25/22 57.7 kg (127 lb 3.2 oz)   07/09/22 56.2 kg (123 lb 12.8 oz)   06/29/22 57.2 kg (126 lb 3.2 oz)     General-  Patient is chronically ill-appearing in no acute distress  Neurologic- Alert and oriented X3.  Cranial nerve II-XII grossly intact.  HEENT-  Normocephalic atraumatic head.  No scleral icterus.  Wearing mask  Neck- Supple, no carotid bruis, no JVP  Lungs- clear to auscultation, no wheezes, rhonchi, or rhales.  Heart-  Bradycardic with HR 40, regular rhythm with occasional PVC's,  normal S1S2, short systolic murmur  Abdomen- Soft, nontender, no organomegally.  Extremities-  No clubbing or cyanosis. No LEE  Pulses- 2+pulses in radial and dorsalis pedis bilaterally.  Psych- Normal mood, appropriate.      Laboratory data:      Component Value Date/Time    PROBNP 338.0 07/26/2020 1604       Lab Results   Component Value Date    WBC 6.8 05/23/2022    HGB 12.6 05/23/2022    HCT 38.7 05/23/2022    PLT 403 05/23/2022       Lab Results   Component Value Date    NA 137 05/13/2022    K 5.0 (H) 05/13/2022    CL 102 05/13/2022    CO2 28.4 05/13/2022  BUN 22 05/13/2022    CREATININE 1.05 (H) 05/13/2022    GLU 143 05/13/2022    CALCIUM 9.8 05/13/2022    MG 2.1 05/13/2022    PHOS 3.2 05/13/2022       Lab Results   Component Value Date    TSH 1.898 06/22/2020    ALBUMIN 3.6 05/10/2022    ALT <7 (L) 05/10/2022    AST 13 05/10/2022    INR 1.1 08/02/2013       Lab Results   Component Value Date    LDL 107 (H) 11/03/2021    HDL 50 11/03/2021       Most Recent Imaging:     LHC  No prior     Echo  10/25/21    1. The left ventricle is normal in size with mildly increased wall  thickness.    2. The left ventricular systolic function is normal, LVEF is visually  estimated at > 55%.    3. The mitral valve leaflets are mildly thickened with normal leaflet  mobility.    4. There is mild mitral valve regurgitation.    5. The aortic valve is probably trileaflet with mildly thickened leaflets  with mildly reduced excursion.    6. There is mild aortic regurgitation.    7. The left atrium is mildly dilated in size.    8. There is moderate pulmonary hypertension.    9. The right ventricle is mildly dilated in size, with normal systolic  function.    10. There is moderate tricuspid regurgitation.    11. The right atrium is mildly dilated  in size.  06/27/20    1. The left ventricle is normal in size with mildly increased wall thickness.    2. The left ventricular systolic function is normal, LVEF is visually estimated at 60-65%.    3. The right ventricle is mildly dilated in size, with normal systolic function.    4. There is grade II diastolic dysfunction (elevated filling pressure).    5. The left atrium is mildly dilated in size.    6. The right atrium is mildly dilated  in size.    7. There is mild aortic regurgitation.    8. There is moderate to severe tricuspid regurgitation.    9. There is severe pulmonary hypertension, estimated pulmonary artery systolic pressure is 71 mmHg.    10. The aorta at the ascending aorta is mildly dilated.     CT Chest  06/22/20  -- No evidence of intrathoracic mass as clinically questioned.  -- Dilated main pulmonary trunk measuring up to 3.9 cm which is nonspecific, however can be seen in the setting of pulmonary hypertension.  -- Grossly unchanged large hiatal hernia with associated adjacent atelectasis.  -- Additional chronic and incidental findings as described above.     NM Spect Mult / ETT 06/29/20  - No evidence for significant ischemia or scar is noted.  - Post stress: Global systolic function is normal. The ejection fraction was greater than 65%.  - Coronary and thoracic aorta calcifications as well as dilated main pulmonary trunk noted on CT, better characterized on recent chest CT scan dated 06/22/2020  - Large hiatal hernia noted, better characterized on recent CT abdomen 06/22/2020     Normal pharmacological stress ECG, nuclear images will be dictated separately.  No significant ST segment changes or arrhythmias were noted during stress  No significant chest Braun symptoms reported     PVL Carotid Duplex 12/06/20  Right Carotid:  There is evidence in the ICA of a less than 40% stenosis. Non-hemodynamically significant plaque noted in the CCA.  Left Carotid: There is evidence in the ICA of a less than 40% stenosis. Non-hemodynamically significant plaque noted in the CCA. Plaque noted in the ECA.  Vertebrals:  Both vertebral arteries were patent with antegrade flow.  Subclavians: Normal flow hemodynamics were seen in bilateral subclavian arteries.    ECG from 10/19/20 shows NSR, no PVCs      Dictated using Dragon software, please excuse typos.    Scribe Attestation:         This document serves as a record of the services and decisions performed by Molli Hazard A. Hessie Dibble, MD on 07/25/2022. It was created on his behalf by Michaele Offer, a trained medical scribe. The creation of this document is based on the provider's statements and observations that were conveyed to the medical scribe during the patient's encounter.     (The information in this document, created by the medical scribe for me, accurately reflects the services I personally performed and the decisions made by me. I have reviewed and approved this document for accuracy.)    I personally spent face-to-face and non-face-to-face in the care of this patient, which includes all pre, intra, and post visit time on the date of service.      Artist Pais Hessie Dibble, MD, MPH  Associate Professor of Medicine  [employer] [place] [facility]  [email or web address]   -    Office [phone]

## 2022-07-25 NOTE — Unmapped (Signed)
DIVISION OF CARDIOLOGY   [employer] [facility] [place]        Date of Service: 07/25/2022    Assessment/Plan     1. PVCs -sinus rhythm with occasional PVC's    2. Hypertension -her blood pressure today is 140/106. We will continue current medications including Lasix and amlodipine 10mg . Start candesartan 8 mg. Advised patient to monitor BP at home and maintain a log.     3. Tobacco use disorder - currently smoking 1 pack a day, increased from 5 cigarettes a day. Complete cessation encouraged.     4. Moderate/Severe Tricuspid Regurgitation - currently asymptomatic without evidence of volume overload.  We will continue to monitor.    5. R Rotator cuff Braun- follow-up with PCP.    Return to clinic:    No follow-ups on file.      Subjective:   ZOX:WRUE Tammy Pain, MD     Chief complaint: f/u of PVCs    History of Present Illness:  Tammy Braun is a 81 y.o. female with a history of bladder cancer s/p bladder removal w/ ileal-conduit with urostomy, emphysema, hypertension, tobacco abuse and hiatal hernia who presents for routine f/up.      Tammy Braun recently established care after hospitalization at [facility]. She was admitted for significant hyponatremia in the setting of hydrochlorothiazide and poor p.o. intake. Was noted to have very frequent ectopy. Nuclear stress test negative for ischemia. Echo w/ normal LVSF, severe PH, mod-sev TR. Ziopatch results during hospital stay showed 19% PVCs that were symptomatic (she felt dizzy and lightheaded during that time). She had recurrence of episodes of lightheadedness/dizziness and EKG 09/21/20 showed recurrence of PVCs in bigeminy. Her daughter felt that it was due to patient taking Tammy Braun powders again for HA and smoking. She states that she took these away from her after hospitalization and thinks that made the PVCs initially improve.     Tammy Braun returns for follow-up today in clinic. Latest echo on 10/25/21 showed left ventricular systolic function is normal, LVEF is visually estimated at > 55%. She notes she has had some R arm Braun that prevents her from moving her shoulder. She states that she experiences DOE, when she walks. She usually uses her inhaler when she walks. Her dizziness has resolved with amlodipine.     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)    Schizoaffective disorder (CMS-HCC)    Onychomycosis of multiple toenails with type 2 diabetes mellitus and peripheral neuropathy (CMS-HCC)    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    Risk for falls    Right hip Braun    Tinea versicolor    Disorder of left rotator cuff    Spinal stenosis of cervical region    Left shoulder Braun    Articular cartilage disorder of shoulder region, left    Tinnitus    Headache  Bradycardia    Skin breakdown    Influenza vaccination declined    Chronic tension-type headache, not intractable    Osteoarthritis of cervical spine    Arthralgia of both ankles    Frequent PVCs    Carotid arterial disease (CMS-HCC) < 40% bilaterally 2021    Primary hypertension    Nonrheumatic tricuspid valve regurgitation    Pulmonary HTN (CMS-HCC)    Pyelitis       Medications:  Current Outpatient Medications   Medication Sig Dispense Refill    acetaminophen (TYLENOL EXTRA STRENGTH) 500 MG tablet Take 2 tablets (1,000 mg total) by mouth every six (6) hours as needed for Braun. 100 tablet 2    acetaminophen (TYLENOL) 500 MG tablet Take 1 tablet (500 mg total) by mouth every six (6) hours as needed for Braun.      albuterol HFA 90 mcg/actuation inhaler Inhale 2 puffs every four (4) hours as needed for wheezing. (Patient taking differently: Inhale 2 puffs Two (2) times a day.) 18 g 2    blood sugar diagnostic (ACCU-CHEK AVIVA PLUS TEST STRP) Strp by Other route daily at 10am. 50 strip 5    blood-glucose meter kit Use to check blood sugar four times daily or as directed by MD. Dx: Diabetes, E08.00 *DISPENSE ACCU-CHEK AVIVA PLUS BRAND* 1 each 0    cyclobenzaprine (FLEXERIL) 5 MG tablet Take 1 tablet (5 mg total) by mouth nightly. 30 tablet 5    DULoxetine (CYMBALTA) 20 MG capsule Take 1 capsule (20 mg total) by mouth daily. (Patient taking differently: Take 1 capsule (20 mg total) by mouth daily as needed.) 90 capsule 0    fluPHENAZine decanoate (PROLIXIN) 25 mg/mL injection Inject 2 mL (50 mg total) into the muscle every thirty (30) days. 5 mL 11    furosemide (LASIX) 20 MG tablet Take 40 mg lasix (2 tablets) daily for 5 days then return to 20 mg daily 35 tablet 0    inhalational spacing device (E-Z SPACER) Spcr 1 each by Miscellaneous route every four (4) hours as needed. 1 each 0    lancets (ACCU-CHEK SOFTCLIX LANCETS) Misc 1 each by Miscellaneous route daily at 10am. 50 each 5    MEDICAL SUPPLY ITEM Entreal formula nutritionally supplement complete caloric density 3 application 3    mirtazapine (REMERON) 7.5 MG tablet Take 1 tablet (7.5 mg total) by mouth nightly. (Patient taking differently: Take 1 tablet (7.5 mg total) by mouth nightly as needed.) 90 tablet 1    polyethylene glycol (MIRALAX) 17 gram packet Take 17 g by mouth daily. 30 packet 11    tiotropium (SPIRIVA WITH HANDIHALER) 18 mcg inhalation capsule Use once daily (Patient taking differently: Place 1 capsule (18 mcg total) into inhaler and inhale daily as needed.) 31 each 11    candesartan (ATACAND) 16 MG tablet Take 1 tablet (16 mg total) by mouth daily. (Patient not taking: Reported on 07/25/2022) 30 tablet 11    diclofenac sodium (VOLTAREN) 1 % gel Apply 2 g topically four (4) times a day. Approved Indication(s): Osteoarthritis of Knees (Patient not taking: Reported on 07/25/2022) 200 g 3    gabapentin (NEURONTIN) 100 MG capsule 1-2 pills tid prn Braun and numbness (Patient not taking: Reported on 07/25/2022) 270 capsule 3    prednisoLONE acetate (PRED FORTE) 1 % ophthalmic suspension Administer 1 drop to the right eye four (4) times a day. (Patient not taking: Reported on 07/25/2022) 10 mL 0    senna (SENOKOT) 8.6 mg tablet Take  2 tablets by mouth nightly as needed for constipation. (Patient not taking: Reported on 07/25/2022) 60 tablet 0     Current Facility-Administered Medications   Medication Dose Route Frequency Provider Last Rate Last Admin    fluPHENAZine decanoate (PROLIXIN) injection 50 mg  50 mg Intramuscular Q30 Days Lovett Calender, MD   50 mg at 07/09/22 1109       Allergies  Allergies   Allergen Reactions    Lisinopril Anaphylaxis and Swelling    Losartan Dizziness    Hctz [Hydrochlorothiazide]      SIADH       Social History:   Social History     Tobacco Use    Smoking status: Every Day     Packs/day: 0.75     Years: 57.00     Pack years: 42.75     Types: Cigarettes    Smokeless tobacco: Never    Tobacco comments:     15cpd   Vaping Use    Vaping Use: Never used   Substance Use Topics    Alcohol use: No     Alcohol/week: 0.0 standard drinks    Drug use: No       Family History:  Family History   Problem Relation Age of Onset    Breast cancer Daughter 31    Diabetes Mother     Glaucoma Father     Colon cancer Neg Hx     Ovarian cancer Neg Hx     Endometrial cancer Neg Hx     Anesthesia problems Neg Hx     Bleeding Disorder Neg Hx        ROS- 12 system review is negative other than what is specified in the History of Present Illness.      Objective:   Physical Exam  Vitals:    07/25/22 1157   BP: 133/72   BP Site: L Arm   BP Position: Sitting   BP Cuff Size: Large   Pulse: 62   SpO2: 97% Weight: 57.7 kg (127 lb 3.2 oz)   Height: 170.2 cm (5' 7)     Wt Readings from Last 3 Encounters:   07/25/22 57.7 kg (127 lb 3.2 oz)   07/09/22 56.2 kg (123 lb 12.8 oz)   06/29/22 57.2 kg (126 lb 3.2 oz)     General-  Patient is chronically ill-appearing in no acute distress  Neurologic- Alert and oriented X3.  Cranial nerve II-XII grossly intact.  HEENT-  Normocephalic atraumatic head.  No scleral icterus.  Wearing mask  Neck- Supple, no carotid bruis, no JVP  Lungs- clear to auscultation, no wheezes, rhonchi, or rhales.  Heart-  Bradycardic with HR 40, regular rhythm with occasional PVC's,  normal S1S2, short systolic murmur  Abdomen- Soft, nontender, no organomegally.  Extremities-  No clubbing or cyanosis. No LEE  Pulses- 2+pulses in radial and dorsalis pedis bilaterally.  Psych- Normal mood, appropriate.      Laboratory data:      Component Value Date/Time    PROBNP 338.0 07/26/2020 1604       Lab Results   Component Value Date    WBC 6.8 05/23/2022    HGB 12.6 05/23/2022    HCT 38.7 05/23/2022    PLT 403 05/23/2022       Lab Results   Component Value Date    NA 137 05/13/2022    K 5.0 (H) 05/13/2022    CL 102 05/13/2022    CO2 28.4 05/13/2022  BUN 22 05/13/2022    CREATININE 1.05 (H) 05/13/2022    GLU 143 05/13/2022    CALCIUM 9.8 05/13/2022    MG 2.1 05/13/2022    PHOS 3.2 05/13/2022       Lab Results   Component Value Date    TSH 1.898 06/22/2020    ALBUMIN 3.6 05/10/2022    ALT <7 (L) 05/10/2022    AST 13 05/10/2022    INR 1.1 08/02/2013       Lab Results   Component Value Date    LDL 107 (H) 11/03/2021    HDL 50 11/03/2021       Most Recent Imaging:     LHC  No prior     Echo  10/25/21    1. The left ventricle is normal in size with mildly increased wall  thickness.    2. The left ventricular systolic function is normal, LVEF is visually  estimated at > 55%.    3. The mitral valve leaflets are mildly thickened with normal leaflet  mobility.    4. There is mild mitral valve regurgitation.    5. The aortic valve is probably trileaflet with mildly thickened leaflets  with mildly reduced excursion.    6. There is mild aortic regurgitation.    7. The left atrium is mildly dilated in size.    8. There is moderate pulmonary hypertension.    9. The right ventricle is mildly dilated in size, with normal systolic  function.    10. There is moderate tricuspid regurgitation.    11. The right atrium is mildly dilated  in size.  06/27/20    1. The left ventricle is normal in size with mildly increased wall thickness.    2. The left ventricular systolic function is normal, LVEF is visually estimated at 60-65%.    3. The right ventricle is mildly dilated in size, with normal systolic function.    4. There is grade II diastolic dysfunction (elevated filling pressure).    5. The left atrium is mildly dilated in size.    6. The right atrium is mildly dilated  in size.    7. There is mild aortic regurgitation.    8. There is moderate to severe tricuspid regurgitation.    9. There is severe pulmonary hypertension, estimated pulmonary artery systolic pressure is 71 mmHg.    10. The aorta at the ascending aorta is mildly dilated.     CT Chest  06/22/20  -- No evidence of intrathoracic mass as clinically questioned.  -- Dilated main pulmonary trunk measuring up to 3.9 cm which is nonspecific, however can be seen in the setting of pulmonary hypertension.  -- Grossly unchanged large hiatal hernia with associated adjacent atelectasis.  -- Additional chronic and incidental findings as described above.     NM Spect Mult / ETT 06/29/20  - No evidence for significant ischemia or scar is noted.  - Post stress: Global systolic function is normal. The ejection fraction was greater than 65%.  - Coronary and thoracic aorta calcifications as well as dilated main pulmonary trunk noted on CT, better characterized on recent chest CT scan dated 06/22/2020  - Large hiatal hernia noted, better characterized on recent CT abdomen 06/22/2020     Normal pharmacological stress ECG, nuclear images will be dictated separately.  No significant ST segment changes or arrhythmias were noted during stress  No significant chest Braun symptoms reported     PVL Carotid Duplex 12/06/20  Right Carotid:  There is evidence in the ICA of a less than 40% stenosis. Non-hemodynamically significant plaque noted in the CCA.  Left Carotid: There is evidence in the ICA of a less than 40% stenosis. Non-hemodynamically significant plaque noted in the CCA. Plaque noted in the ECA.  Vertebrals:  Both vertebral arteries were patent with antegrade flow.  Subclavians: Normal flow hemodynamics were seen in bilateral subclavian arteries.    ECG from 10/19/20 shows NSR, no PVCs      Dictated using Dragon software, please excuse typos.    Scribe Attestation:         This document serves as a record of the services and decisions performed by Molli Hazard A. Hessie Dibble, MD on 07/25/2022. It was created on his behalf by Michaele Offer, a trained medical scribe. The creation of this document is based on the provider's statements and observations that were conveyed to the medical scribe during the patient's encounter.     (The information in this document, created by the medical scribe for me, accurately reflects the services I personally performed and the decisions made by me. I have reviewed and approved this document for accuracy.)    I personally spent face-to-face and non-face-to-face in the care of this patient, which includes all pre, intra, and post visit time on the date of service.      Artist Pais Hessie Dibble, MD, MPH  Associate Professor of Medicine  [employer] [facility] [place] [facility]  [email or web address]   -    Office [phone]

## 2022-07-27 ENCOUNTER — Ambulatory Visit: Admit: 2022-07-27 | Discharge: 2022-07-28 | Payer: MEDICARE

## 2022-07-27 DIAGNOSIS — F250 Schizoaffective disorder, bipolar type: Principal | ICD-10-CM

## 2022-07-27 NOTE — Unmapped (Addendum)
Thank you for coming to your appointment today! It was a pleasure meeting you. We will continue your injection as prescribed and plan to meet again on November 3rd at 10:45am.     Follow-up instructions:  -- Please continue taking your medications as prescribed for your mental health.   -- Do not make changes to your medications, including taking more or less than prescribed, unless under the supervision of your physician. Be aware that some medications may make you feel worse if abruptly stopped  -- Please refrain from using illicit substances, as these can affect your mood and could cause anxiety or other concerning symptoms.   -- Seek further medical care for any increase in symptoms or new symptoms such as thoughts of wanting to hurt yourself or hurt others.     Contact info:  Life-threatening emergencies: Call 911 or go to the nearest ER for medical or psychiatric attention.     Issues that need urgent attention but are not life threatening: Call the clinic outpatient front desk for assistance. [phone] for our [facility] clinic located at [address] [facility]. [phone] for our [facility] STEP clinic.    Non-urgent routine concerns and questions: Send a message through [facility] or call our clinic front desk.    Refill requests: Check with your pharmacy to initiate refill requests.    Regarding appointments:  - If you need to cancel your appointment, we ask that you call your clinic at least 24 hours before your scheduled appointment. [phone] for our [facility] clinic located at [address] [facility]. [phone] for our [facility] STEP clinic.  - If for any reason you arrive 15 minutes later than your scheduled appointment time, you may not be seen and your visit may be rescheduled.  - Please remember that we will not automatically reschedule missed appointments.  - If you miss two (2) appointments without letting us know in advance, you will likely be referred to a provider in your community.  - We will do our best to be on time. Sometimes an emergency will arise that might cause your clinician to be late. We will try to inform you of this when you check in for your appointment. If you wait more than 15 minutes past your appointment time without such notice, please speak with the front desk staff.    In the event of bad weather, the clinic staff will attempt to contact you, should your appointment need to be rescheduled. Additionally, you can call the Patient Weather Line [phone] for system-wide clinic status    For more information and reminders regarding clinic policies (these were provided when you were admitted to the clinic), please ask the front desk.

## 2022-07-27 NOTE — Unmapped (Signed)
[facility]  Psychiatry   Established Patient E&M Service - Outpatient       Assessment:    Tammy Braun presents for follow-up evaluation with her daughter. Today 07/27/22, patient endorses continued stability on regimen of Prolixin 50 mg LAI q30 days. Patient has tolerated this regimen of Prolixin well. She notes some emergence of auditory hallucinations in the days leading up to her injection, last time about 2 months ago. While they are somewhat distressing, patient is able to manage and be functional. No safety concerns. Daughter and patient still in interested in psychotherapy to work on depression/anxiety symptoms as patient gets older. They would also like LAI and medication management appointments to be on the same day if possible. Tammy Braun and daughter in room verbalize understanding and are agreeable with plan. Plan to follow up in about 3 months.     Identifying Information:  Tammy Braun is a 81 y.o. female with a history of schizoaffective disorder. First presented to STEP clinic September 2021. She has been stable on prolixin 50 mg q30 days for the past 30 years. Per PCP, patient has had worsening of depressive symptoms, though patient did not endorse. Reports auditory hallucinations and denies any CAH. Reports some anxiety related to impatience waiting on others, but finds this manageable. Patient is not interested in any new medications, but could consider increasing the frequency of prolixin administration if needed. March 2022, augmenting with mirtazapine for low mood and sleep. Could also consider augmenting with SRNI or wellbutrin for depression.     Risk Assessment:  A full suicide and violence risk assessment was performed as part of this patient's initial evaluation with [facility] outpatient psychiatry.  There is no new acute risk for suicide or violence at this time. The patient was educated about relevant modifiable risk factors including following recommendations for treatment of psychiatric illness and abstaining from substance abuse.   While future psychiatric events cannot be accurately predicted, the patient does not currently require acute inpatient psychiatric care and does not currently meet [facility] involuntary commitment criteria.      Plan:    Problem: Schizoaffective disorder  Status of problem: chronic and stable  Interventions:     - Continue Prolixin 50 mg IM q30 days; could consider increasing frequency of injections in the future  - Discontinued Cymbalta 20 mg daily as patient has not been taking consistently   - Pt is interested in therapy, on waitlist for therapy at [facility].     #High Risk Medication Monitoring   Lab drawn 11/03/21: LDL 107  Lab drawn 04/11/22: 6.6    Psychotherapy provided:  No billable psychotherapy service provided.    Patient has been given this writer's contact information as well as the [facility] Psychiatry urgent line number. The patient has been instructed to call 911 for emergencies.    Patient was seen and plan of care was discussed with the Attending MD,Dr. Dominic Pea, who agrees with the above statement and plan.    Subjective:    Interval History:   Patient seen in person with her daughter. She reports not hearing the voices quite as much, no paranoia. Normal days looks like getting up in the morning and going about her day. Mood is good. Denies current AVH, SI/HI. Says sometimes she hears a voice that says You're dying, last time 2 months ago. This is somewhat distressing, but patient feels it is manageable and her symptoms are significantly improved compared to decompensations. Has not  been connected with psychotherapy, would still be interested. Denies medication side effects, doing okay physically.     Daughter reports source of negative voices may be due to multiple doctors she sees, which may get patient concerned about her health. Patient and daughter do not have any concerns today and are satisfied with continuing current plan of care. Objective:    Mental Status Exam:  Appearance:    Appears stated age, Well nourished and Well developed   Motor:   No abnormal movements   Speech/Language:    Normal rate, volume, tone, fluency and Impaired articulation   Mood:   Okay   Affect:   Calm, Cooperative and Euthymic   Thought process and Associations:   Logical, linear, clear, coherent, goal directed   Abnormal/psychotic thought content:     Denies SI, HI, self harm, delusions, obsessions, paranoid ideation, or ideas of reference   Perceptual disturbances:     Endorses auditory hallucinations towards the end of the month before she is due for her next injection   Other:   Insight, judgment, and impulse control are fair         Visit was completed face to face.      Gerline Legacy, MD  PGY-2, Psychiatry  07/27/22

## 2022-07-27 NOTE — Unmapped (Signed)
I saw the patient in person together with the Resident for the key portions of the exam. I discussed the findings, assessment and plan with the Resident and agree with the findings and plan as documented in the Resident's note.

## 2022-07-31 NOTE — Unmapped (Signed)
Form [facility] , form faxed back to # [phone]      Tammy Braun

## 2022-08-03 NOTE — Unmapped (Signed)
Form faxed back to # [phone]    [facility]

## 2022-08-08 ENCOUNTER — Ambulatory Visit: Admit: 2022-08-08 | Discharge: 2022-08-09 | Payer: MEDICARE

## 2022-08-08 ENCOUNTER — Institutional Professional Consult (permissible substitution): Admit: 2022-08-08 | Discharge: 2022-08-09 | Payer: MEDICARE

## 2022-08-08 MED ADMIN — fluPHENAZine decanoate (PROLIXIN) injection 50 mg: 50 mg | INTRAMUSCULAR | @ 15:00:00

## 2022-08-08 NOTE — Unmapped (Signed)
Administered long-acting injectable today. See MAR for additional documentation. Johntay Doolen, CMA

## 2022-08-16 NOTE — Unmapped (Signed)
[facility] Clinic Administered Medication Refill Coordination Note      NAME:Tammy Braun DOB: 1941/09/18      Medication: fluphenazine    Day Supply: 28 days      SHIPPING      Next delivery from [facility] [phone]) to  [facility]  for Tammy Braun is scheduled for 08/22.    Clinic contact: [name]     Patient's next nurse visit for administration: 09/08.    We will follow up with clinic monthly for standard refill processing and delivery.      Janet Humphreys Samella Parr  Specialty Pharmacy Technician

## 2022-08-20 NOTE — Unmapped (Signed)
Form placed in the provider's box         Kaci Dillie

## 2022-08-21 MED FILL — FLUPHENAZINE DECANOATE 25 MG/ML INJECTION SOLUTION: INTRAMUSCULAR | 28 days supply | Qty: 5 | Fill #5

## 2022-09-04 ENCOUNTER — Ambulatory Visit: Admit: 2022-09-04 | Discharge: 2022-09-05 | Payer: MEDICARE

## 2022-09-04 LAB — HEMOGLOBIN A1C
ESTIMATED AVERAGE GLUCOSE: 137 mg/dL
HEMOGLOBIN A1C: 6.4 % — ABNORMAL HIGH (ref 4.8–5.6)

## 2022-09-04 MED ORDER — CYCLOBENZAPRINE 5 MG TABLET
ORAL_TABLET | Freq: Two times a day (BID) | ORAL | 5 refills | 30 days | Status: CP | PRN
Start: 2022-09-04 — End: ?

## 2022-09-04 NOTE — Unmapped (Signed)
Form placed in the provider's box         Tammy Braun

## 2022-09-04 NOTE — Unmapped (Signed)
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)       Cardiovascular and Mediastinum    Primary hypertension       Musculoskeletal and Integument    Osteoarthrosis neck    Relevant Orders    XR Hand 2 Views Bilateral    Ambulatory referral to Rheumatology       Genitourinary    Malignant neoplasm of bladder (CMS-HCC)    Chronic renal disease, stage 2, mildly decreased glomerular filtration rate (GFR) between 60-89 mL/min/1.73 square meter - Primary       Other    Schizoaffective disorder (CMS-HCC)    Spinal stenosis of cervical region    Chronic tension-type headache, not intractable    Relevant Medications    cyclobenzaprine (FLEXERIL) 5 MG tablet     Other Visit Diagnoses       Vitamin D deficiency        Relevant Orders    Vitamin D 25 Hydroxy (25OH D2 + D3)            Labs  Refills  F/U 2 months  Updated problem list annotations  Database review and update  Med review and update  Lab review and update  Counseling provided and patient education  Ref rheum per patient request  40 minutes with patient- 1/2 in face to face counseling on chronic health conditions and  dm and pain joints    SUBJECTIVE:    Tammy Braun is a 81 y.o. female that presents to clinic today regarding the following issues:    Here for follow-up  Some pain hands  Arthritis mult places  Daughter wants specialist to see    PMH Mult chronic complex issues  Chronic smoking     PMH + parastomal pain status post neoadjuvant chemotherapy and radical cystectomy with Ileal Conduit on 10/05/2011 for T2N0M0 bladder cancer.     PMH Chronic issues, including: COPD, Neuropathy, Chronic pain, HTN, DJD neck, hip, cervical stenosis, cervical radiculopathy, Chronic renal disease, Depression , Onychomycosis, Emphysema, Ca Trigone Bladder, Cervical Myofascial pain with chronic tension headaches with medication overuse headaches, left reverse total shoulder arthroplasty, large hiatal hernia, Pyelonephritis, Kidney stones      reports that she has been smoking cigarettes. She has a 42.75 pack-year smoking history. She has never used smokeless tobacco.    The following portions of the patient's history were reviewed and updated as appropriate: allergies, current medications, past family history, past medical history, past social history, past surgical history and problem list.    Review of systems for 10 organ systems conducted and no significant positives      OBJECTIVE:    Vital signs have been reviewed:   Vitals:    09/04/22 1146   BP: 123/78   Pulse: 75   Temp: 36.1 ??C (97 ??F)     Vss  Arthritis in hand joints clear  Lungs ctac  Cv rsr  Neuro neg

## 2022-09-06 NOTE — Unmapped (Signed)
Results Request     Patient Name: Tammy Braun   Caller: Self (Patient)  Contact Method: Telephone Call: Time-  asap  [phone]   Type of Results: Labs

## 2022-09-07 ENCOUNTER — Institutional Professional Consult (permissible substitution): Admit: 2022-09-07 | Discharge: 2022-09-08 | Payer: MEDICARE

## 2022-09-07 MED ADMIN — fluPHENAZine decanoate (PROLIXIN) injection 50 mg: 50 mg | INTRAMUSCULAR | @ 16:00:00

## 2022-09-07 NOTE — Unmapped (Signed)
Administered long-acting injectable today. See MAR for additional documentation. Chayne Baumgart, CMA

## 2022-09-10 LAB — VITAMIN D 25 HYDROXY: VITAMIN D, TOTAL (25OH): 31.6 ng/mL (ref 20.0–80.0)

## 2022-09-10 NOTE — Unmapped (Signed)
Not all the lab results are back yet.

## 2022-09-10 NOTE — Unmapped (Signed)
Results Request     Patient Name: Tammy Braun   Caller: Self (Patient)  Contact Method: Telephone Call: Time- Any Time [phone]   Type of Results: Labs      -

## 2022-09-11 ENCOUNTER — Ambulatory Visit: Admit: 2022-09-11 | Discharge: 2022-09-12 | Payer: MEDICARE

## 2022-09-13 NOTE — Unmapped (Signed)
[facility] Administered Medication Refill Coordination Note      NAME:Tammy Braun DOB: February 28, 1941      Medication: fluphenazine    Day Supply: 28 days      SHIPPING      Next delivery from [facility] [phone]) to  [facility]  for Tammy Braun is scheduled for 09/21.    Clinic contact: [name]     Patient's next nurse visit for administration: 10/06.    We will follow up with clinic monthly for standard refill processing and delivery.      Alyus Mofield Samella Parr  Specialty Pharmacy Technician

## 2022-09-13 NOTE — Unmapped (Signed)
Results Request     Patient Name: Tammy Braun   Caller: Self (Patient)  Contact Method: Telephone Call: Time- Any Time [phone]   Type of Results: Labs  Patient would like a call to go over results.

## 2022-09-14 NOTE — Unmapped (Signed)
Form placed in the provider's box         Tammy Braun

## 2022-09-20 MED FILL — FLUPHENAZINE DECANOATE 25 MG/ML INJECTION SOLUTION: INTRAMUSCULAR | 28 days supply | Qty: 5 | Fill #6

## 2022-09-26 ENCOUNTER — Ambulatory Visit: Admit: 2022-09-26 | Payer: MEDICARE

## 2022-10-02 NOTE — Unmapped (Signed)
ASSESSMENT AND PLAN:  Tammy Braun is a 81 y.o. female with parastomal pain status post neoadjuvant chemotherapy and radical cystectomy with Ileal Conduit on 10/05/2011 for T2N0M0 bladder cancer.    Given that she is over 5 years out from her cystectomy, she does not require further oncologic follow up. She has a stable parastomal hernia.     She was seen in the ED for pyleonephritis treated with antibiotics several months ago. RUS at last visit confirmed near- resolution of hydronephrosis. We repeated her RUS to ensure complete resolution- today, this shows persistent mild hydro.    To be sure of no anatomic obstruction, we will obtain a loopogram.    She is accompanied by [name] - her daughter today.     She is trying to make her bathroom handicap-accessible to prevent falls. I agree with this - as this is medically necessary and she has a high fall risk.    PLAN:  Loopogram next available  Shiny will fax our note for Medicaid request    It was a pleasure seeing this delightful woman in my clinic today.    REASON FOR VISIT:   Follow-up status post cystectomy - and ED visit with pyelo several months ago    HISTORY OF PRESENT ILLNESS:  Tammy Braun is a 81 y.o. 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    We have seen her for many years. She has a known stomal/parastomal hernia which has been managed conservatively. History of constipation which we have addressed many time, unfortunately patient is often forgetful to take daily bowel regimen. She has mild intermittent RLQ lower abodminal discomfort. Seems to be constipative/gas pains in nature. She does have bowel movements every other day during good weeks. No N/V. Stoma healthy pink, protuberant and productive of yellow urine.     She no longer needs oncologic surveillance and is followed annually with renal ultrasound and laboratory studies. She was last seen in my clinic in 09/2021 but went to the ED 04/2022 for pyelonephritis- treated with antibiotics. She is feeling much better today following completion of her abx. She had a cataract surgery recently and has that appointment later today.    05/2020  CT scan - no evidence of disease, stable parastomal hernia, mild R hydroureter (consistent with known reflux with conduit).   10/03/2021 RUS - no hydro, bilateral nephrolithiasis, bilateral cysts  05/10/2022 ED visit for pyelonephritis; CT showed R hydroureter with urothelial enhancement suggestive of pyelitis, new left mild hydro    She returns today with a repeat RUS to confirm resolution following treatment of UTI.    She is applying for Medicaid.    Past medical, surgical, family, and social history were reviewed and remain unchanged.    MEDICATIONS:   Current Outpatient Medications   Medication Sig Dispense Refill    acetaminophen (TYLENOL EXTRA STRENGTH) 500 MG tablet Take 2 tablets (1,000 mg total) by mouth every six (6) hours as needed for pain. 100 tablet 2    acetaminophen (TYLENOL) 500 MG tablet Take 1 tablet (500 mg total) by mouth every six (6) hours as needed for pain.      blood sugar diagnostic (ACCU-CHEK AVIVA PLUS TEST STRP) Strp by Other route daily at 10am. 50 strip 5    blood-glucose meter kit Use to check blood sugar four times daily or  as directed by MD. Dx: Diabetes, E08.00 *DISPENSE ACCU-CHEK AVIVA PLUS BRAND* 1 each 0    candesartan (ATACAND) 32 MG tablet Take 1 tablet (32 mg total) by mouth daily. 30 tablet 11    cyclobenzaprine (FLEXERIL) 5 MG tablet Take 1 tablet (5 mg total) by mouth two (2) times a day as needed for muscle spasms. 60 tablet 5    DULoxetine (CYMBALTA) 20 MG capsule Take 1 capsule (20 mg total) by mouth daily. (Patient taking differently: Take 1 capsule (20 mg total) by mouth daily as needed.) 90 capsule 0    fluPHENAZine decanoate (PROLIXIN) 25 mg/mL injection Inject 2 mL (50 mg total) into the muscle every thirty (30) days. 5 mL 11    furosemide (LASIX) 20 MG tablet Take 40 mg lasix (2 tablets) daily for 5 days then return to 20 mg daily 35 tablet 0    inhalational spacing device (E-Z SPACER) Spcr 1 each by Miscellaneous route every four (4) hours as needed. 1 each 0    lancets (ACCU-CHEK SOFTCLIX LANCETS) Misc 1 each by Miscellaneous route daily at 10am. 50 each 5    MEDICAL SUPPLY ITEM Entreal formula nutritionally supplement complete caloric density 3 application 3    polyethylene glycol (MIRALAX) 17 gram packet Take 17 g by mouth daily. 30 packet 11    prednisoLONE acetate (PRED FORTE) 1 % ophthalmic suspension Administer 1 drop to the right eye four (4) times a day. 10 mL 0    senna (SENOKOT) 8.6 mg tablet Take 2 tablets by mouth nightly as needed for constipation. 60 tablet 0    tiotropium (SPIRIVA WITH HANDIHALER) 18 mcg inhalation capsule Use once daily (Patient taking differently: Place 1 capsule (18 mcg total) into inhaler and inhale daily as needed.) 31 each 11     Current Facility-Administered Medications   Medication Dose Route Frequency Provider Last Rate Last Admin    fluPHENAZine decanoate (PROLIXIN) injection 50 mg  50 mg Intramuscular Q30 Days Lovett Calender, MD   50 mg at 09/07/22 1131     ALLERGIES:    Allergies   Allergen Reactions    Lisinopril Anaphylaxis and Swelling    Losartan Dizziness    Hctz [Hydrochlorothiazide]      SIADH     ROS:   Negative upon 10-system review other than what is mentioned in the HPI.     PHYSICAL EXAMINATION:   GENERAL: Pleasant female in no acute distress.   VITAL SIGNS: not currently breastfeeding.  PULMONARY: Normal work of breathing, no use of accessory muscles  ABDOMEN: Soft, non-tender, non-distended.  No organomegaly. Soft reducible peristomal hernia noted. Incision sites are clean, dry, intact. Stoma is prolapsed, but pink with clear urine in bag.   PSYCHOLOGIC: Normal affect, normal mood    IMAGING:  XR Dexa Bone Density Skeletal  Narrative: EXAM: DEXA BONE DENSITY SKELETAL  DATE: 09/11/2022 11:33 AM  ACCESSION: [id number]  DICTATED: 09/11/2022 11:38 AM  INTERPRETATION LOCATION: Main [facility]    CLINICAL INDICATION: 81 years old Female with screen  - Z13.820 - Osteoporosis screening - M48.52XA - Collapsed vertebra, not elsewhere classified, cervical region, initial encounter for fracture (CMS - HCC)      COMPARISON: None.    TECHNIQUE: Bone mineral density was performed using the Horizon A bone densitometer.  The results of the study are expressed in bone mineral density (BMD) and interpreted using World Health Organization [facility]) criteria, per ISCD positions.    FINDINGS:    Lumbar Spine (L1-L3)  Excluded Levels: L4  because the difference in T-score is greater than 1.0 between vertebral  levels.    BMD:0.920 (g/cm)      T-score:-1.8     WHO classification: LOW BONE MASS.       Left Hip       Femoral Neck:           BMD:0.545 (g/cm)           T-score: -2.9      Total Hip           BMD:0.475 (g/cm)            T-score: -3.6      WHO classification: OSTEOPOROSIS.     Notes:      Lumbar Spine Comparison:    Note that levels L1-L4 are used for comparison purposes and any excluded levels (if applicable) may be included in the current and prior comparison BMD calculations.    Hip T-Score:  For the measurement of the hip T-score, the lowest of either the Total Hip or Femoral Neck BMD values is used.      Secondary causes of bone loss should be evaluated if clinically indicated since the etiology of low BMD cannot be determined by BMD measurement alone.    Impression: 1.WHO classification: OSTEOPOROSIS.    LABS:  Office Visit on 09/04/2022   Component Date Value Ref Range Status    Hemoglobin A1C 09/04/2022 6.4 (H)  4.8 - 5.6 % Final    Estimated Average Glucose 09/04/2022 137  mg/dL Final    Vitamin D Total (25OH) 09/04/2022 31.6  20.0 - 80.0 ng/mL Final

## 2022-10-03 ENCOUNTER — Ambulatory Visit: Admit: 2022-10-03 | Discharge: 2022-10-03 | Payer: MEDICARE

## 2022-10-03 DIAGNOSIS — C679 Malignant neoplasm of bladder, unspecified: Principal | ICD-10-CM

## 2022-10-03 NOTE — Unmapped (Signed)
Office notes faxed to Marylene Land at [phone] with confirmation per patient request. Rhunette Croft, RN

## 2022-10-04 DIAGNOSIS — E1149 Type 2 diabetes mellitus with other diabetic neurological complication: Principal | ICD-10-CM

## 2022-10-04 DIAGNOSIS — N182 Chronic kidney disease, stage 2 (mild): Principal | ICD-10-CM

## 2022-10-04 NOTE — Unmapped (Signed)
[facility] Population Health   Care Management Progress Note             Date of Service:  10/04/2022      Service:  Care Management - phone    Purpose of contact:           Arthritis/Osteoarthritis and Diabetes    Patient's daughter was able to finish process for getting motorized wheelchair last year, but supplier didn't have wheelchairs that were covered by patient's insurance. CM called Erie Noe at [employer] who will be able to get patient wheelchair within 2 weeks of receiving additional paperwork from PCP. CM faxed over notes but Erie Noe says documentation is not sufficient.    Patient needs letter from PCP that confirms medical need for having bathroom and shower remodeled to become accessible and prevent falls. Patient requesting handle bar and other suggestions from PCP.    Patient was recently hospitalized for infection around kidney. Kidneys are still swollen. She will soon be getting tested to ensure urine is passing and that cancer isn't returning by Dr. Katrinka Blazing in urology.    Daughter think wrong Ensure was sent to patient. She received Ensure Plus instead of Ensure High Protein. CM encouraged daughter to bring a bottle to appt so PCP can review ingredients.    Patient has Case Manager, Mick Sell, at [facility] and is also receiving CAP-DA/CHOICE aid for 50 hrs/wk -- 8hrs M-F, 4 hrs between Sat and Sun. Patient's daughter is about to start looking into how to receive more assistance. Mick Sell will be working on paperwork for this. Patient agreeable to MAWV, note routed to [facility] to schedule.      Provider/Care Partner(s)  to follow up on:   - Needs note to support bathroom remodeling for accessibility  - Needs paperwork signed for motorized wheelchair -- should be in PCP's box  - Face to face visit note for power wheelchair that includes below documentation    Reason for Visit:  The patient must be seen and evaluated for a face-to-face mobility device.   The Objective of PMD:  PWC must be needed to improve mobility and to complete his/her ADLs timely, such as eating, bathing, feeding, toileting, and remaining independent within his/her home. The patient must be physically and mentally capable of operating all controls on a PWC.   ROM:  Upper body strength VS. Lower body strength is???. (Example. 1/5, 2/5, 3/5, 4/5, 5/5.)  If the patient has a prosthetic leg (s), please comment on why the leg is not sufficient.  A cane, walker, power operative vehicle or POV, and a manual wheelchair have been tried and ruled out because ?????????      Health Maintenance:  Health Maintenance Due   Topic Date Due    Medicare Annual Wellness Visit (AWV)  Never done    Hepatitis C Screen  Never done    DTaP/Tdap/Td Vaccines (1 - Tdap) Never done    Zoster Vaccines (1 of 2) Never done    Pneumococcal Vaccine 65+ (2 - PCV) 06/04/2018    COVID-19 Vaccine (3 - Pfizer risk series) 10/19/2020    Urine Albumin/Creatinine Ratio  12/14/2020    Influenza Vaccine (1) Never done     I addressed these health maintenance gaps with the patient: Well Child Visit/Annual Preventive Visit  Plan for health maintenance gaps addressed: Other: note routed to [facility] to schedule visit    Additional Information/Plan:  Patient provided my direct contact information and encouraged to contact  me should additional needs arise.    Minutes spent providing outreach: 60    Bexley Mclester  Uvaldo Bristle, [employer]  Population Health  [facility]

## 2022-10-05 ENCOUNTER — Institutional Professional Consult (permissible substitution): Admit: 2022-10-05 | Discharge: 2022-10-06 | Payer: MEDICARE

## 2022-10-05 MED ADMIN — fluPHENAZine decanoate (PROLIXIN) injection 50 mg: 50 mg | INTRAMUSCULAR | @ 15:00:00

## 2022-10-05 NOTE — Unmapped (Signed)
Administered long-acting injectable today. See MAR for additional documentation. [name], CMA

## 2022-10-05 NOTE — Unmapped (Signed)
Complex Case Management  SUMMARY NOTE    Attempted to contact pt today at Home number to introduce Complex Case Management services. Left message to return call.; 1st attempt    Discuss at next visit: Introduction to Complex Case Management    Lindajo RoyalAmy Manson [place]oo, Care Coordinator    Complex Case Management   Phone: [phone]

## 2022-10-05 NOTE — Unmapped (Signed)
Form placed in the provider's box         Tammy Braun

## 2022-10-08 NOTE — Unmapped (Signed)
[facility] Population Health   Care Management Progress Note             Date of Service:  10/08/2022      Service:  Care Management - phone    Purpose of contact:         CM called pt regarding: Medicare AWV     #Medicare AWV  CM contacted patient's daughter [name]) to schedule Medicare Annual Wellness Visit per PCP's request. CM explained the contents of an AWV, and pt was amenable to scheduling. CM scheduled appointment with Lavell Anchors, CPP on 11/28 at 10 AM. CM informed pt that this appointment will be at the [facility] location. CM also confirmed address with pt, and informed her that she will receive a Health Risks Assessment (HRA) to be completed and brought with her to the appointment. Pt expressed understanding.     CM inquired if patient has an advance directive or living will. Pt reports that she has not been able to complete an advance directive yet, but was amenable to receiving a blank copy in the mail.     CM sent both HRA and Advance Directive via communications letter.     Barriers to Care:  N/A    Provider/Care Partner(s)  to follow up on:   N/A    Health Maintenance:  Health Maintenance Due   Topic Date Due    Medicare Annual Wellness Visit (AWV)  Never done    Hepatitis C Screen  Never done    DTaP/Tdap/Td Vaccines (1 - Tdap) Never done    Zoster Vaccines (1 of 2) Never done    Pneumococcal Vaccine 65+ (2 - PCV) 06/04/2018    COVID-19 Vaccine (3 - Pfizer risk series) 10/19/2020    Urine Albumin/Creatinine Ratio  12/14/2020    Influenza Vaccine (1) Never done       Additional Information/Plan:  Patient provided my direct contact information and encouraged to contact me should additional needs arise.    Minutes spent providing outreach: 25    Shatana Saxton  [facility]  Population Health and Care Management - [employer] Fellow  [email or web address]  [phone]

## 2022-10-10 ENCOUNTER — Ambulatory Visit: Admit: 2022-10-10 | Discharge: 2022-10-11 | Payer: MEDICARE

## 2022-10-10 DIAGNOSIS — E1149 Type 2 diabetes mellitus with other diabetic neurological complication: Principal | ICD-10-CM

## 2022-10-10 DIAGNOSIS — M25572 Pain in left ankle and joints of left foot: Principal | ICD-10-CM

## 2022-10-10 DIAGNOSIS — N182 Chronic kidney disease, stage 2 (mild): Principal | ICD-10-CM

## 2022-10-10 DIAGNOSIS — M1710 Unilateral primary osteoarthritis, unspecified knee: Principal | ICD-10-CM

## 2022-10-10 DIAGNOSIS — J438 Other emphysema: Principal | ICD-10-CM

## 2022-10-10 DIAGNOSIS — M810 Age-related osteoporosis without current pathological fracture: Principal | ICD-10-CM

## 2022-10-10 DIAGNOSIS — Z9181 History of falling: Principal | ICD-10-CM

## 2022-10-10 DIAGNOSIS — E1169 Type 2 diabetes mellitus with other specified complication: Principal | ICD-10-CM

## 2022-10-10 DIAGNOSIS — M25551 Pain in right hip: Principal | ICD-10-CM

## 2022-10-10 DIAGNOSIS — E1142 Type 2 diabetes mellitus with diabetic polyneuropathy: Principal | ICD-10-CM

## 2022-10-10 DIAGNOSIS — M4802 Spinal stenosis, cervical region: Principal | ICD-10-CM

## 2022-10-10 DIAGNOSIS — M25571 Pain in right ankle and joints of right foot: Principal | ICD-10-CM

## 2022-10-10 DIAGNOSIS — B351 Tinea unguium: Principal | ICD-10-CM

## 2022-10-10 MED ORDER — CALCIUM CARBONATE 600 MG-VITAMIN D3 10 MCG (400 UNIT) TABLET
ORAL_TABLET | Freq: Every day | ORAL | 3 refills | 90 days | Status: CP
Start: 2022-10-10 — End: ?

## 2022-10-10 NOTE — Unmapped (Addendum)
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    At maximum risk for fall    Right hip pain    Spinal stenosis of cervical region    Arthralgia of both ankles    Age-related osteoporosis without current pathological fracture     Other Visit Diagnoses       Mobility impaired    -  Primary            Has extensive mobility impairment issues  At risk for falls  Cannot perform easily ADLs, getting to bathroom, feeding, toilets  Cannot use POV  Decreased ROM U,LE's  Get help maintain independence  Can operate safely physically and mentally   Failed manual wheelchair  Is not strong enough to use manual wheelchair  Gilmer Mor will not provide stability because of neuropathy     Updated problem list annotations  Database review and update  Med review and update  Lab review and update  Counseling provided and patient education    SUBJECTIVE:    Tammy Braun is a 81 y.o. female that presents to clinic today regarding the following issues:    Sw working on bathroom  Handicap change  Osteoporosis  Getting electric walker  Has extensive mobility impairment issues  At risk for falls       reports that she has been smoking cigarettes. She has a 42.75 pack-year smoking history. She has never used smokeless tobacco.    The following portions of the patient's history were reviewed and updated as appropriate: allergies, current medications, past family history, past medical history, past social history, past surgical history and problem list.    Review of systems for 10 organ systems conducted and no significant positives      OBJECTIVE:    Vital signs have been reviewed:   Vitals:    10/10/22 1408   BP: 112/84   Pulse: 69   Temp: 36.3 ??C (97.3 ??F)     VSS  Strength (0-5)  UE 3+  LE3+  ROM (0-5)  UE 2+  LE 2+

## 2022-10-10 NOTE — Unmapped (Signed)
[facility] Population Health   Care Management Progress Note             Date of Service:  10/10/2022      Service:  Care Management - face to face    Purpose of contact:             Barriers to Care:  N/A    Provider/Care Partner(s)  to follow up on:   Order for DME equipment.     Health Maintenance:  Health Maintenance Due   Topic Date Due    Medicare Annual Wellness Visit (AWV)  Never done    Hepatitis C Screen  Never done    DTaP/Tdap/Td Vaccines (1 - Tdap) Never done    Zoster Vaccines (1 of 2) Never done    Pneumococcal Vaccine 65+ (2 - PCV) 06/04/2018    COVID-19 Vaccine (3 - Pfizer risk series) 10/19/2020    Urine Albumin/Creatinine Ratio  12/14/2020    Influenza Vaccine (1) Never done         Additional Information/Plan:    CM met with patient per PCP request.During the visit CM met with patient and her daughter. CM introduced self and role at [facility].CM shared with patient and daughter that CM is not the CM Destinee that they have been working with on patient care team.     Follow up for Care Manager:   Patient daughter shared that she would like a order place that stated that the bathroom and shower needs  handicap accessible doors and that door needs to be widened to be handicap adding grab bars and any additional accessible needs.       Patient daughter senior medical someone showed up with rental chair and patient daughter stated she did not like the power wheelchair. Erie Noe from [employer] will be in contact with CM Destinee. Patient daughter stated the power chair would be rented and unsure why it would be rented. Patient daughter shared that she would like to continue to work with Fut[employer] trader Destinee with receiving power wheelchair.     Orders Needed:  -Request for increased personal care hours    -Ensure milk to say high protein  fax all information to [facility].   -Order that states patient needs:General accessible for handicap living for the bathroom bathroom and shower needs  handicap accessible doors and that door needs to be widened to be handicap adding grab bars.    Order should be faxed to:  [facility] Marletta Lor [phone]  Fax:[phone]  Back up person is Glynda Jaeger [phone]         CM shared with patient and daughter that follow up will be referred to patient care manager Destinee.     Minutes spent providing outreach: 30    Sidda Humm Isaac Bliss, [employer]  Population Health  [facility]

## 2022-10-11 MED ORDER — TIOTROPIUM BROMIDE 18 MCG CAPSULE WITH INHALATION DEVICE
ORAL_CAPSULE | Freq: Every day | RESPIRATORY_TRACT | 5 refills | 31 days | Status: CP | PRN
Start: 2022-10-11 — End: ?

## 2022-10-11 NOTE — Unmapped (Signed)
Medication Request     Patient Name: Tammy Braun   Caller: Self (Patient)  Have you contacted your pharmacy? yes      Last Visit: 10/10/2022       Medication Name: Inhaler  Dosage:N/A  Route: Oral (PO)  Frequency: As needed.  Day Supply Requested: 30  Pharmacy (Name & Address):   [facility][id number] Cheree Ditto, [place] - [address] AT [facility] OF [address] & [address] [facility]    Pharmacy Phone Number: [phone]     Patient requesting a refill.

## 2022-10-12 NOTE — Unmapped (Signed)
[facility] Administered Medication Refill Coordination Note      NAME:Tammy Braun Patient DOB: 29-Jul-1941      Medication: fluphenazine    Day Supply: 28 days      SHIPPING      Next delivery from [facility] [phone]) to  [facility]  for Tammy Braun is scheduled for 10/17.    Clinic contact: [name]     Patient's next nurse visit for administration: 11/03.    We will follow up with clinic monthly for standard refill processing and delivery.      Areesha Dehaven Samella Parr  Specialty Pharmacy Technician

## 2022-10-12 NOTE — Unmapped (Signed)
Complex Case Management  SUMMARY NOTE    Attempted to contact pt today at Home number to introduce Complex Case Management services. Left message to return call.; 2nd attempt, letter sent.    Discuss at next visit: Introduction to Complex Case Management    Lindajo Royalmy Cassidi Modesitt, Care Coordinator    Complex Case Management   Phone: [phone]

## 2022-10-15 DIAGNOSIS — N182 Chronic kidney disease, stage 2 (mild): Principal | ICD-10-CM

## 2022-10-15 DIAGNOSIS — E1149 Type 2 diabetes mellitus with other diabetic neurological complication: Principal | ICD-10-CM

## 2022-10-15 NOTE — Unmapped (Signed)
[facility]   Care Management Progress Note             Date of Service:  10/15/2022      Service:  Care Management - phone    Purpose of contact:  Erie Noe at [facility] [employer] called to request PT wheelchair assessment notes from [facility]. CM made Erie Noe aware that those notes are from Sept 2022 and that they are outdated. Erie Noe was made aware that we are still working on completing forms and that they will be turned in to her as soon as possible. Update sent to PCP.    Provider/Care Partner(s)  to follow up on:   - Needs note to support bathroom remodeling for accessibility  - Needs paperwork signed for motorized wheelchair -- should be in PCP's box  - Face to face visit note for power wheelchair that includes below documentation     Reason for Visit:  The patient must be seen and evaluated for a face-to-face mobility device.   The Objective of PMD:  PWC must be needed to improve mobility and to complete his/her ADLs timely, such as eating, bathing, feeding, toileting, and remaining independent within his/her home. The patient must be physically and mentally capable of operating all controls on a PWC.   ROM:  Upper body strength VS. Lower body strength is???. (Example. 1/5, 2/5, 3/5, 4/5, 5/5.)  If the patient has a prosthetic leg (s), please comment on why the leg is not sufficient.  A cane, walker, power operative vehicle or POV, and a manual wheelchair have been tried and ruled out because ?????????    Additional Information/Plan:  Patient provided my direct contact information and encouraged to contact me should additional needs arise.    Minutes spent providing outreach: 15    Jalicia Roszak  Uvaldo Bristle, [employer]  Population Health  [facility]

## 2022-10-15 NOTE — Unmapped (Addendum)
[facility] Population Health   Care Management Progress Note             Date of Service:  10/15/2022      Service:  Care Management - phone    Purpose of contact:  CM received VM saying that she received a rental wheelchair. CM attempted to call patient's daughter to confirm that PCP and CM did not put in request for rental wheelchair and to update patient on status of referral.    Additional Information/Plan:  Patient provided my direct contact information and encouraged to contact me should additional needs arise.    Minutes spent providing outreach: 5    Coleen Cardiff  Uvaldo Bristle, [employer]  Population Health  [facility]

## 2022-10-16 MED FILL — FLUPHENAZINE DECANOATE 25 MG/ML INJECTION SOLUTION: INTRAMUSCULAR | 28 days supply | Qty: 5 | Fill #7

## 2022-10-19 NOTE — Unmapped (Signed)
Form faxed back to # [phone]    [facility]

## 2022-10-29 MED ORDER — TIOTROPIUM BROMIDE 18 MCG CAPSULE WITH INHALATION DEVICE
ORAL_CAPSULE | Freq: Every day | RESPIRATORY_TRACT | 5 refills | 31 days | PRN
Start: 2022-10-29 — End: ?

## 2022-10-29 NOTE — Unmapped (Signed)
Medication Request     Patient Name: Tammy Braun   Caller: Self (Patient)  Have you contacted your pharmacy? yes      Last Visit: 10/10/2022       Medication Name: tiotropium (SPIRIVA WITH HANDIHALER) 18 mcg inhalation capsule   Dosage:18 mcg  Route:  inhaler  Frequency: Daily  Day Supply Requested: 30  Pharmacy [facility] & Address[facility]   Pharmacy Phone Number: N/a

## 2022-10-30 MED ORDER — TIOTROPIUM BROMIDE 18 MCG CAPSULE WITH INHALATION DEVICE
ORAL_CAPSULE | Freq: Every day | RESPIRATORY_TRACT | 5 refills | 31 days | PRN
Start: 2022-10-30 — End: ?

## 2022-10-30 NOTE — Unmapped (Signed)
Prior Authorization for Meds (Pt Advice Request) - Fax :     Medication that requires prior authorization: SPIRIVA 18 MCG CAPSULES 30S AND HANDIHALER  Name of insurance company requiring prior authorization: MEDICARE  Member ID # for this company: [id number]  Insurance company phone number: [phone]  Pharmacy: [name]  -  [address] [place]   Phone#: [phone]  Fax#: [phone]  Other comments: PLAN DOES NOT COVER THIS MEDICATION. PLEASE CALL PLAN AT [phone] TO INITIATE PRIOR AUTHORIZATION OR CALL / FAX PHARMACY TO CHANGE MEDICATION.

## 2022-11-01 NOTE — Unmapped (Signed)
PA sent to covermymeds awaiting results.

## 2022-11-01 NOTE — Unmapped (Signed)
PA approved.

## 2022-11-02 ENCOUNTER — Ambulatory Visit: Admit: 2022-11-02 | Discharge: 2022-11-03 | Payer: MEDICARE

## 2022-11-02 DIAGNOSIS — Z79899 Other long term (current) drug therapy: Principal | ICD-10-CM

## 2022-11-02 LAB — LIPID PANEL
CHOLESTEROL/HDL RATIO SCREEN: 3.1 (ref 1.0–4.5)
CHOLESTEROL: 157 mg/dL (ref <=200)
HDL CHOLESTEROL: 51 mg/dL (ref 40–60)
LDL CHOLESTEROL CALCULATED: 88 mg/dL (ref 40–99)
NON-HDL CHOLESTEROL: 106 mg/dL (ref 70–130)
TRIGLYCERIDES: 92 mg/dL (ref 0–150)
VLDL CHOLESTEROL CAL: 18.4 mg/dL (ref 11–41)

## 2022-11-02 NOTE — Unmapped (Signed)
[facility]  Psychiatry   Established Patient E&M Service - Outpatient       Assessment:    Tammy Braun presents for follow-up evaluation with her daughter. Today 11/02/22, patient endorses continued stability on regimen of Prolixin 50 mg LAI q30 days. Patient has tolerated this regimen of Prolixin well. She notes some infrequent auditory hallucinations (usually in the days leading up to her injection), but this is tolerable. No safety concerns. Daughter and patient still in interested in psychotherapy to work on depression/anxiety symptoms as patient gets older. Daughter expressed concern about potential of infection and desire to have CBC drawn, given hospitalization about 3 months ago for pyelonephritis. Patient, however, denied physical concerns (besides neuropathy), had normal vital signs, and was not delirious on exam (which could point to infectious process). Per urology note in October, no current concerns for infection. Advised daughter to follow up with PCP/urology for further assessment. Tammy Braun and daughter in room verbalize understanding and are agreeable with plan. RTC in 3 months.     Identifying Information:  Tammy Braun is a 81 y.o. female with a history of schizoaffective disorder. First presented to STEP clinic September 2021. She has been stable on prolixin 50 mg q30 days for the past 30 years. Per PCP, patient has had worsening of depressive symptoms, though patient did not endorse. Reports auditory hallucinations and denies any CAH. Reports some anxiety related to impatience waiting on others, but finds this manageable. Patient is not interested in any new medications, but could consider increasing the frequency of prolixin administration if needed. March 2022, augmenting with mirtazapine for low mood and sleep. Could also consider augmenting with SRNI or wellbutrin for depression.     Risk Assessment:  A full suicide and violence risk assessment was performed as part of this patient's initial evaluation with [facility] outpatient psychiatry.  There is no new acute risk for suicide or violence at this time. The patient was educated about relevant modifiable risk factors including following recommendations for treatment of psychiatric illness and abstaining from substance abuse.   While future psychiatric events cannot be accurately predicted, the patient does not currently require acute inpatient psychiatric care and does not currently meet [facility] involuntary commitment criteria.      Plan:    Problem: Schizoaffective disorder  Status of problem: chronic and stable  Interventions:   - Continue Prolixin 50 mg IM q30 days; could consider increasing frequency of injections in the future  - Discontinued Cymbalta 20 mg daily as patient has not been taking consistently   - Pt is interested in therapy, on waitlist for therapy at [facility].     #High Risk Medication Monitoring   Lab drawn 11/03/21: LDL 107  Lab drawn 04/11/22: 6.6  [ ]  follow up repeat lipid panel     Psychotherapy provided:  No billable psychotherapy service provided.    Patient has been given this writer's contact information as well as the [facility] Psychiatry urgent line number. The patient has been instructed to call 911 for emergencies.    Patient was seen and plan of care was discussed with the Attending MD,Dr. Dominic Pea, who agrees with the above statement and plan.    Subjective:    Interval History:   Patient seen in person with daughter. Reports that things are going alright. She is shopping, paying bills, etc.. Mood is good, denies depression, anxiety. Still interested in therapist, though. Reports sleeping well, sometimes sleeps until 7am, sometimes less. No issues  with injection. She reports hearing voices every once in a while, but they are much improved from previous. Reports discomfort from neuropathy, but going to church helps. Discussed to talk further with PCP about treatment for neuropathy and that psychotherapy may help with coping.     Daughter reports patient was in the hospital for pyelonephritis a few months ago and daughter is worried about missing another infection. She worries that patient sometimes forgets things or asks too many questions and sees this as a potential sign of infection. She requests we draw a CBC to check the WBC. Patient, however, denies fever, chills, dysuria, or other signs of infections. Patient is oriented to self, place, exact date. She is also able to complete DOWB with ease. Patient reports feeling physically well beside neuropathic pain.     Objective:    Mental Status Exam:  Appearance:    Appears stated age, Well nourished and Well developed   Motor:   No abnormal movements   Speech/Language:    Normal rate, volume, tone, fluency and Impaired articulation   Mood:   Good   Affect:   Calm, Cooperative and Euthymic   Thought process and Associations:   Logical, linear, clear, coherent, goal directed   Abnormal/psychotic thought content:     Denies SI, HI, self harm, delusions, obsessions, paranoid ideation, or ideas of reference   Perceptual disturbances:     Endorses very infrequent auditory hallucinations   Other:   Insight, judgment, and impulse control are fair       Visit was completed face to face.      Tammy Legacy, MD  PGY-2, Psychiatry  11/02/22

## 2022-11-02 NOTE — Unmapped (Signed)
I saw the patient together with the Resident for the key portions of the exam. I discussed the findings, assessment and plan with the Resident and agree with the findings and plan as documented in the Resident's note.

## 2022-11-02 NOTE — Unmapped (Signed)
Patient Advice Request     Patient Name: Tammy Braun  Caller: Self (Patient)  Contact Method: Telephone Call: Time- Any Time [phone]   Reason for Call: Patients daughter has called in and stated that her albuterol has no been yet refilled and they are still waiting on Dr. Amedeo Plenty approval to fill it. [facility] state that they have sent over a order request for this medication.  Previously Discussed: yes  Appointment Offered: No

## 2022-11-02 NOTE — Unmapped (Addendum)
Thank you for coming to your appointment today! We did not make any medication changes today and will follow up with you about the results of the lipid panel. We will follow up in person on January 26th at 10:30am.     Follow-up instructions:  -- Please continue taking your medications as prescribed for your mental health.   -- Do not make changes to your medications, including taking more or less than prescribed, unless under the supervision of your physician. Be aware that some medications may make you feel worse if abruptly stopped  -- Please refrain from using illicit substances, as these can affect your mood and could cause anxiety or other concerning symptoms.   -- Seek further medical care for any increase in symptoms or new symptoms such as thoughts of wanting to hurt yourself or hurt others.     Contact info:  Life-threatening emergencies: Call 911, the 988 suicide and crisis lifeline, or go to the nearest ER for medical or psychiatric attention.     Issues that need urgent attention but are not life threatening: Call the clinic outpatient front desk for assistance. [phone] for our [facility] clinic located at [address] [facility]. [phone] for our [facility] STEP clinic.    Non-urgent routine concerns and questions: Send a message through [facility] or call our clinic front desk.    Refill requests: Check with your pharmacy to initiate refill requests.    Regarding appointments:  - If you need to cancel your appointment, we ask that you call your clinic at least 24 hours before your scheduled appointment. [phone] for our [facility]patient [facility] clinic located at [address] [facility]. [phone] for our [facility] STEP clinic.  - If for any reason you arrive 15 minutes later than your scheduled appointment time, you may not be seen and your visit may be rescheduled.  - Please remember that we will not automatically reschedule missed appointments.  - If you miss two (2) appointments without letting us know in advance, you will likely be referred to a provider in your community.  - We will do our best to be on time. Sometimes an emergency will arise that might cause your clinician to be late. We will try to inform you of this when you check in for your appointment. If you wait more than 15 minutes past your appointment time without such notice, please speak with the front desk staff.    In the event of bad weather, the clinic staff will attempt to contact you, should your appointment need to be rescheduled. Additionally, you can call the Patient Weather Line [phone] for system-wide clinic status    For more information and reminders regarding clinic policies (these were provided when you were admitted to the clinic), please ask the front desk.

## 2022-11-02 NOTE — Unmapped (Signed)
Patient had labs drawn in clinic today. One stick in her right hand. Patient tolerated it well.    Randa Evens

## 2022-11-05 NOTE — Unmapped (Addendum)
Patient has called in to check to see if albuterol has been refilled yet. Patient is requesting to speak with a nurse and see if a nurse can explain what is going on. Patient best call back #[phone].

## 2022-11-05 NOTE — Unmapped (Cosign Needed)
[facility]   Care Management Progress Note             Date of Service:  11/05/2022      Service:  Care Management - phone    Purpose of contact:  CM received call from [employer]. Erie Noe says coverage for power wheelchair has been denied. Direction given on what needs to be included as a signed addendum on PCP's latest in-person note. CM relayed information to PCP. PCP confirmed he will make addendum.     Additional Information/Plan:  Patient provided my direct contact information and encouraged to contact me should additional needs arise.    Minutes spent providing outreach: 20    Charlayne Vultaggio  Uvaldo Bristle, [employer]  Population Health  [facility]

## 2022-11-05 NOTE — Unmapped (Signed)
Patient is not requesting  tiotropium (SPIRIVA WITH HANDIHALER) 18 mcg inhalation capsule [[id number]] , patient is requesting an albuterol inhaler. This medication seems to be something she has taken in the pass and it is not in her charts. Patient and daughter both have been calling in for weeks regarding this Albuterol inhaler and it is being confused for  tiotropium (SPIRIVA WITH HANDIHALER) 18 mcg inhalation capsule [[id number]] . Patient has  tiotropium (SPIRIVA WITH HANDIHALER) 18 mcg inhalation capsule [[id number]] and picked it up days ago.

## 2022-11-05 NOTE — Unmapped (Signed)
Medication Request     Patient Name: Tammy Braun   Caller: Self (Patient)  Have you contacted your pharmacy? yes      Last Visit: 10/10/2022       Medication Name: tiotropium [facility] WITH HANDIHALER) 18 mcg inhalation capsule [[id number]]  Route: Oral (PO)  Frequency: As Needed (PRN)  Day Supply Requested: 51  Pharmacy (Name & Address):   [facility] #[id number] Cheree Ditto, [place] - [address] AT [facility] OF [address]  [address] [place], [place] [address]  Phone: [phone]  Fax: [phone]

## 2022-11-06 ENCOUNTER — Institutional Professional Consult (permissible substitution): Admit: 2022-11-06 | Discharge: 2022-11-07 | Payer: MEDICARE

## 2022-11-06 ENCOUNTER — Ambulatory Visit: Admit: 2022-11-06 | Discharge: 2022-11-07 | Payer: MEDICARE | Attending: Family Medicine | Primary: Family Medicine

## 2022-11-06 MED ORDER — GABAPENTIN 100 MG CAPSULE
ORAL_CAPSULE | Freq: Every evening | ORAL | 2 refills | 0.00000 days | Status: CP
Start: 2022-11-06 — End: 2022-11-06

## 2022-11-06 MED ORDER — ALBUTEROL SULFATE HFA 90 MCG/ACTUATION AEROSOL INHALER
RESPIRATORY_TRACT | 11 refills | 0 days | Status: CP | PRN
Start: 2022-11-06 — End: ?

## 2022-11-06 MED ADMIN — fluPHENAZine decanoate (PROLIXIN) injection 50 mg: 50 mg | INTRAMUSCULAR | @ 20:00:00

## 2022-11-06 NOTE — Unmapped (Signed)
Assessment/Plan: Tammy Braun is a 81 y.o. female with Medication Refill (-for albuterol inhaler) and Leg Pain (-pain and numbness )      Refer to [facility] PT for B adhesive cap  Nail care    Message destine    [name] was seen today for medication refill and leg pain.    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  - Stopped duloxetine. Try gabapentin 100 mg po QHS    Cough  -     albuterol HFA 90 mcg/actuation inhaler; Inhale 2 puffs every four (4) hours as needed for wheezing.  RF as requested    Chronic pain of both shoulders        - Refer to PT. Suspect adhesive capsulitis.       HPI: Tammy Braun is a 81 y.o. female with   Past Medical History:   Diagnosis Date    Anxiety     Arthritis     Breast cyst     Cancer (CMS-HCC)     bladder    Depression, psychotic (CMS-HCC)     Diabetes mellitus (CMS-HCC)     in past    Hernia     History of transfusion     Hypertension     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/08/2015     Needs albuterol RF  Duloxetine not working, stopped taking  C/o leg pain due to diabetic neuropathy. No longer taking gabapentin. Too sedating. Was taking TID. Pain mostly a problem at night.   C/o B shoulder pain for mos. No injury. Limiting function. [name] to reach back and overhead.       Comprehensive 10 point ROS negative except as above.    I have reviewed and if necessary updated the PMH, PSH, Medications, and Allergies in Epic. SH and FH reviewed and updated in Epic.    Physical Exam:    Wt Readings from Last 3 Encounters:   11/06/22 58 kg (127 lb 12.8 oz)   11/06/22 57.9 kg (127 lb 9.6 oz)   11/02/22 58.8 kg (129 lb 9.6 oz)     Temp Readings from Last 3 Encounters:   11/06/22 36.2 ??C (97.2 ??F) (Temporal)   10/10/22 36.3 ??C (97.3 ??F) (Temporal)   10/03/22 35.9 ??C (96.6 ??F) (Temporal)     BP Readings from Last 3 Encounters:   11/06/22 131/70   11/06/22 110/68   11/02/22 140/80     Pulse Readings from Last 3 Encounters:   11/06/22 63   11/06/22 70   11/02/22 71     Estimated body mass index is 24.92 kg/m?? as calculated from the following:    Height as of this encounter: 152.4 cm (5').    Weight as of this encounter: 57.9 kg (127 lb 9.6 oz).  Facility age limit for growth %iles is 20 years.    General appearance: alert, appears stated age, cooperative, and no distress  Lungs: clear to auscultation bilaterally  Heart: regular rate and rhythm, S1, S2 normal, no murmur, click, rub or gallop  Extremities: extremities normal, atraumatic, no cyanosis or edema  Shoulders: decrease ROM, esp extension and internal rotation. R worse than L.     Discharge Medications:  has a current medication list which includes the following prescription(s): acetaminophen, acetaminophen, blood sugar diagnostic, blood-glucose meter, calcium carbonate-vitamin d3, candesartan, cyclobenzaprine, furosemide, inhalational spacing device, lancets, MEDICAL SUPPLY ITEM, polyethylene glycol, prednisolone acetate, senna, tiotropium, albuterol, fluphenazine decanoate, and gabapentin, and the following Facility-Administered Medications: fluphenazine decanoate.

## 2022-11-06 NOTE — Unmapped (Signed)
Administered long-acting injectable today,. See MAR for additional documentation. Tuyet Bader, CMA

## 2022-11-06 NOTE — Unmapped (Cosign Needed)
Pt declined getting influenza and covid booster today.

## 2022-11-07 NOTE — Unmapped (Signed)
Provider filled the patient's Albuterol inhaler prescription at her appointment today.     Sharlette Dense, RN

## 2022-11-07 NOTE — Unmapped (Signed)
[facility] Pharmacy Clinic Administered Medication Refill Coordination Note      NAME:Tammy Braun DOB: [birth year]      Medication: fluphenazine    Day Supply: 28 days      SHIPPING      Next delivery from [facility] [phone]) to  [facility]  for [name] is scheduled for 11/16.    Clinic contact: [name]     Patient's next nurse visit for administration: 12/07.    We will follow up with clinic monthly for standard refill processing and delivery.      Aksel Bencomo Samella Parr  Specialty Pharmacy Technician

## 2022-11-08 ENCOUNTER — Ambulatory Visit: Admit: 2022-11-08 | Discharge: 2022-11-09 | Payer: MEDICARE

## 2022-11-08 MED ADMIN — diatrizoate meglumine (CYSTOGRAFIN-DILUTE) 18 % solution 125 mL: 125 mL | @ 17:00:00 | Stop: 2022-11-08

## 2022-11-13 NOTE — Unmapped (Signed)
Results Request     Patient Name: Tammy Braun   Caller: Self (Patient)  Contact Method: Telephone Call: Time- Any Time [phone]   Type of Results: Imaging    DOS: 11/08/2022   Dept:     IMG FLUORO HBR         ROUTE TO PROVIDER

## 2022-11-15 MED ORDER — FLUPHENAZINE DECANOATE 25 MG/ML INJECTION SOLUTION
INTRAMUSCULAR | 11 refills | 60 days | Status: CP
Start: 2022-11-15 — End: ?
  Filled 2022-11-15: qty 5, 28d supply, fill #0

## 2022-11-15 NOTE — Unmapped (Signed)
Renewed prescription for fluphenazine LAI q30days as prescription has expired, per shared services pharmacy.

## 2022-11-17 NOTE — Unmapped (Signed)
-----   Message from Linus Salmons sent at 11/17/2022  9:57 AM EST -----  Regarding: Dely Request  PT soc was attempted for today. Patient stated she's not available today or Sunday and requested Monday or Tuesday for pt soc. Requesting 11/21 for delay.

## 2022-11-19 ENCOUNTER — Encounter: Admit: 2022-11-19 | Payer: MEDICARE

## 2022-11-19 ENCOUNTER — Encounter: Admit: 2022-11-19 | Discharge: 2022-12-18 | Payer: MEDICARE

## 2022-11-19 ENCOUNTER — Inpatient Hospital Stay: Admit: 2022-11-19 | Payer: MEDICARE

## 2022-11-19 ENCOUNTER — Ambulatory Visit: Admit: 2022-11-19 | Payer: MEDICARE

## 2022-11-19 NOTE — Unmapped (Signed)
[facility] Population Health   Care Management Progress Note             Date of Service:  11/19/2022      Service:  Care Management - phone    Purpose of contact:  Chart review done to assess patient's ongoing needs to coordinate care with PCP. Ensure milk high protein prescription request sent to nurse staff. Care coordination planning between PCP and SW team completed.    Provider/Care Partner(s)  to follow up on:     1) signed addendum on 10/12 notes for wheelchair. DME provider says the words Patient cannot use POV and ROM 1/5 for upper body strength and lower body is 1/5 or whatever PCP thinks the patient's ROM is is required on documentation    2) Request for increased personal care hours      3) Letter that states patient needs general accessible for handicap living for the bathroom. Bathroom and shower needs handicap accessible doors and doors need to be widened to be handicap. Shower and bathroom also need grab bars.     Health Maintenance:  Health Maintenance Due   Topic Date Due    Medicare Annual Wellness Visit (AWV)  Never done    Hepatitis C Screen  Never done    DTaP/Tdap/Td Vaccines (1 - Tdap) Never done    Zoster Vaccines (1 of 2) Never done    Pneumococcal Vaccine 65+ (2 - PCV) 06/04/2018    COVID-19 Vaccine (3 - Pfizer risk series) 10/19/2020    Urine Albumin/Creatinine Ratio  12/14/2020    Influenza Vaccine (1) Never done       Additional Information/Plan:  Patient provided my direct contact information and encouraged to contact me should additional needs arise.    Minutes spent providing outreach: 35    Kaysee Hergert  Uvaldo Bristle, [employer]  Population Health  [facility]

## 2022-11-20 NOTE — Unmapped (Signed)
Form faxed back to # [phone]    [facility]

## 2022-11-21 NOTE — Unmapped (Signed)
-----   Message from Flavia Shipper, LCSWA sent at 11/20/2022  4:21 PM EST -----  Hi, this patient is requesting a prescription for high protein ensure milk. It can be faxed to the contact below. Would you be able to assist with this?    [facility]   Marletta Lor phone-[phone]   Fax:[phone]     Thanks so much,  Destinee Burch, [employer]

## 2022-11-24 NOTE — Unmapped (Incomplete)
Annual Wellness Visit:  Health risk assessment was completed and reviewed with patient and all required components of AWV were addressed and updated in EHR.    Tammy Braun is a 81 yo female with a PMH of T2DM, diabetic neuropathy, CAD, HTN, tricuspid regurgitation, pulmonary HTN, COPD, OA, osteoporosis, CKD, HLD, schizoaffective disorder, spinal stenosis, bradycardia, anxiety, tinnitus, pyelonephritis, depression, cataracts, bladder cancer, radical cystectomy with ileal conduit, and cervical radiculopathy. Patient is disabled and requires assistance of wheelchair and handicap devices in home.   >Med List: cyclobenzaprine (elderly), fluphenazine      - renally dosed?  >Labs: K+5.0 (05/13/22), eGFR 54 (05/13/22), A1c 6.4% (09/04/22), lipid panel WNL (11/02/22)  >PCP recently switched from duloxetine to gabapentin  >T2DM/ HLD: no meds??  HTN: candesartan 32 mg, furosemide 20 mg  COPD: LAMA, SABA    [ ]  Meds  >Tolerating gabapentin  [ ]  Vitals  [ ]  COPD well controlled? wheezing? SOB? Frequency of rescue?  >Could consider escalation to [facility]  [ ]  No meds for osteoporosis?  >Could consider addition  [ ]  Constipation  [ ]  Mini cog  [ ]  Immunizations  No GUG    ASSESSMENT/PLAN TO ADDRESS SCREENING, RISK FACTORS, AND PERSONAL PREVENTION PLAN:    Follow-up for PCP (concerns identified or screenings that patient preferred to discuss with PCP first):  {AWV PCP follow-up:81582}    Medication changes today:  ***    Orders Placed today:  No orders of the defined types were placed in this encounter.      Personalized Prevention Plan: A personalized prevention plan was reviewed with the patient, appropriate referrals were made, and the patient was given a copy for their personal records as a part of the After Visit Summary available in Epic.       HEALTH RISK SCREENING, ASSESSMENT/PLAN:  1. Tobacco Use Screen: {Blank single:19197::No concerns identified based on screening,Tobacco use concerns identified.  Patient was referred to Quitline for support,Tobacco use concerns identified.  Patient was referred to Quitline for support as well as offered additional handouts with local resources}  Tobacco Use: High Risk (11/06/2022)    Patient History     Smoking Tobacco Use: Every Day     Smokeless Tobacco Use: Never     Passive Exposure: Not on file       2. Unhealthy Alcohol Use Screen:   Alcohol Use: Not on file     AUDIT-C score: ***    The AUDIT-C is scored on a scale of 0-12 (scores of 0 reflect no alcohol use). In men, a score of 4 or more is considered positive; in women, a score of 3 or more is considered positive. Generally, the higher the AUDIT-C score, the more likely it is that the patient's drinking is affecting his/her health and safety.    {Blank single:19197::No concerns identified based on screening,Alcohol use concerns identified.  Patient was encouraged to discuss alcohol use at their next PCP visit.}    3. Nutrition:     Food Insecurity: No Food Insecurity (05/11/2022)    Hunger Vital Sign     Worried About Running Out of Food in the Last Year: Never true     Ran Out of Food in the Last Year: Never true     There is no height or weight on file to calculate BMI.    {Blank single:19197::No concerns identified based on screening,Referral placed to dietitian for obesity,Patient eligible for dietitian visits for obesity, but patient declined today. Reassess at future visits.,Food resources  provided in AVS today}    4. Physical Activity:  Physical Activity: Insufficiently Active (04/09/2018)    Exercise Vital Sign     Days of Exercise per Week: 3 days     Minutes of Exercise per Session: 30 min       Plan: {Blank single:19197::Pt advised to exercise 30 minutes most days of the week,Pt advised to continue exercising at least 30 minutes most days of the week}    5. Substance Use  Substance use screening completed in SDOH module today. {Blank single:19197::No concerns identified based on screening,Recommended follow-up with PCP for full risk assessment}    ***Patient has been identified on as currently using a prescribed opioid medication or an illicit drug.   {AWV opioids:59017}    6. Advance Care Planning: {Blank single:19197::Patient has an advanced directive, Patient identified they would like information on advance care planning and was given the [place] Advance Directive form.  Patient was informed that a future visit can be made to discuss this information further, Patient not interested in advance care planning at this time}. Health care decision maker was reviewed and updated today.    Advance Care Planning     Participants: ***  Discussion/Action: ***    Health Care Decision Maker as of 11/24/2022    HCDM (patient stated preference): Tammy Braun - Daughter - [phone]                 7. General Health: {Blank single:19197::Excellent,Very good,Good,Fair,Poor}    Mouth and teeth condition: {Blank single:19197::Excellent,Very good,Good,Fair,Poor}    Fastens seat belt when driving or riding in a vehicle? {Blank single:19197::Yes,No}     8. Functional Ability/Safety Screen:      > Home Safety Evaluation: {Blank single:19197::No concerns identified based on screening,Concerns Identified: Patient referred to [employer] on [employer]}    ***if Training and development officer education needed enter .IMCResourceFireSafety on AVS        > Functional Evaluation and ADL Assessment:: {Blank single:19197::No concerns identified based on screening questionnaire. Patient able to adequately perform ADLs,Pt unable to perform ADLs: will schedule f/u with patient and caregiver to discuss further interventions.  Patient referred to [employer] on Aging,Pt is homebound and a referral was made to [facility] for in home evaluation,Pt is not homebound and a referral was made to a community resource for in home evaluation}    ***if [facility] referral, patient must have face-to-face with MD within previous 3 months > Fall risk:   Fall Assessment      {Blank single:19197::No concerns identified based on screening and Get Up And Go score of <12 seconds,Fall risk identified. Get Up and Go score of >/= 12 seconds. Patient referred to physical therapy}   Timed-up and Go Observations requiring PCP evaluation: {Blank single:19197::Slow tenative pace,Loss of balance,Short strides,Little or no arm swing,Steadying self on walls,Shuffling,En bloc turning,Not using assistive device properly}  ***If falls positive use .fallspositive        > Hearing Assessment: Hearing HHIE-S score:  ***; {Blank single:19197::No concerns identified based on screening, HHIE-S score 0 to 8,Hearing handicap identified on HHIE-S screening score of 10-40, patient referred to audiology,Hearing handicap identified on HHIE-S screening score of 10-40, patient declined referral to audiology,Patient already sees an audiologist}             9. Cognition screen:   Step 1: Please listen carefully, I am going to say three words that I want you to repeat back to me now and try to remember. The words are {  Blank single:19197::banana, sunrise, chair,leader, season, table,village, kitchen, baby}. Please say them for me know.   Step 2: Next, I want you to draw a clock for me. First, put all of the numbers where they go. When that is complete, say Now, set the hands to 10 past 11.  Step 3: What were the three words I asked you to recall?    Scoring:  Word recall: 1 point for each word spontaneously recalled without cueing  Clock: 2 points for normal clock with numbers correct sequence and position. 0 points for inability or refusal to draw clock.    ***Add photo of clock to note using media button if abnormal.    Result: ***; {Blank single:19197::No concerns identified based on screening,Concerning for dementia or other cognitive deficit (Score</=3).  Schedule f/u appt within 4 weeks with PCP and patient/patient's family for further evaluation.}     10. Depression screen: PHQ-9 score: ***; {vlnphq9severity:30189}, {Blank single:19197::No concerns identified based on screening,Mild depression identified on screening, no intervention was required at this time,Moderate depression identified on screening: Patient was offered counseling,Severe depression identified on screening: Patient was offered counseling.  Additionally, medication titration was performed,???Severe depression identified on screening: Patient was offered counseling and referred to PCP for medication titration}       11. Screening Studies  See Personalized Prevention Plan in AVS    12. The 10-year ASCVD risk score (Arnett DK, et al., 2019) is: 49.5%    Values used to calculate the score:      Age: 45 years      Sex: Female      Is Non-Hispanic African American: Yes      Diabetic: Yes      Tobacco smoker: Yes      Systolic Blood Pressure: 120 mmHg      Is BP treated: Yes      HDL Cholesterol: 51 mg/dL      Total Cholesterol: 157 mg/dL    Note: For patients with SBP <90 or >200, Total Cholesterol <130 or >320, HDL <20 or >100 which are outside of the allowable range, the calculator will use these upper or lower values to calculate the patient???s risk score.      ASCVD Risk Assessment  {Blank single:19197::already on statin,up-to-date on lipid screening,lipid screening ordered today,deprescribed aspirin,continue aspirin given ascvd risk,ASCVD secondary prevention medications reviewed today,***}    REASON FOR VISIT: Annual Wellness Visit    LIST OF CURRENT PROVIDERS:  Patient Care Team:  Vanetta Shawl, MD as PCP - General (Family Medicine)  Vanetta Shawl, MD as PCP - Lynetta Mare, Erskin Burnet as Nurse Practitioner (Cardiology)  Hessie Dibble, Ruta Hinds, MD as Consulting Physician (Cardiology)  Flavia Shipper, LCSWA as Care Manager  Ward Givens, MD as Resident (Psychiatry)  ***    ALLERGIES: Reviewed and Updated in EPIC:   Allergies   Allergen Reactions    Lisinopril Anaphylaxis and Swelling    Losartan Dizziness    Hctz [Hydrochlorothiazide]      SIADH     MEDICATIONS: Reviewed and Updated in EPIC  Discrepancies: ***  Adherence Concerns: ***    PAST MEDICAL / SURGICAL HISTORY:  Active Ambulatory Problems     Diagnosis Date Noted    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    Senile nuclear sclerosis 05/23/2012    DM (diabetes mellitus), 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    HTN (hypertension) 07/18/2011    Malignant neoplasm of bladder (CMS-HCC) 03/23/2011    Tobacco use  disorder 07/18/2011    Hernia, diaphragmatic 07/09/2013    Hyponatremia 08/03/2013    Hip arthritis mild 05/12/2014    Chronic renal disease, stage 2, mildly decreased glomerular filtration rate (GFR) between 60-89 mL/min/1.73 square meter 06/16/2014    Peripheral neuropathy 07/07/2014    Hypercholesterolemia refuses meds 04/28/2015    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    Schizoaffective disorder (CMS-HCC) 05/08/2015    Onychomycosis of multiple toenails with type 2 diabetes mellitus and peripheral neuropathy (CMS-HCC) 08/09/2016    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 11/07/2016    At maximum risk for fall 03/06/2017    Right hip pain 04/22/2017    Tinea versicolor 10/28/2017    Disorder of left rotator cuff 02/13/2018    Spinal stenosis of cervical region 02/13/2018    Left shoulder pain 02/20/2018    Articular cartilage disorder of shoulder region, left 02/20/2018    Tinnitus 01/13/2019    Headache 06/24/2020    Bradycardia 06/27/2020    Skin breakdown 06/30/2020    Influenza vaccination declined 09/15/2020    Chronic tension-type headache, not intractable 09/15/2020    Osteoarthritis of cervical spine 09/15/2020    Arthralgia of both ankles 09/15/2020    Frequent PVCs 09/21/2020    Carotid arterial disease (CMS-HCC) < 40% bilaterally 2021 12/08/2020    Primary hypertension 09/27/2021 Nonrheumatic tricuspid valve regurgitation 09/27/2021    Pulmonary HTN (CMS-HCC) 04/11/2022    Pyelitis 05/10/2022    Age-related osteoporosis without current pathological fracture 09/14/2022    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) 10/18/2022     Resolved Ambulatory Problems     Diagnosis Date Noted    Depression, major, 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    Encounter for screening mammogram for high-risk patient 12/09/2012    Dizziness 07/08/2013    Nausea vomiting and diarrhea 08/03/2013    Thrombocytopenia (CMS-HCC) 08/11/2013    Colon cancer screening sigmoid '16 ok 03/15/2015    Type 2 diabetes mellitus (CMS-HCC) 05/05/2015    Severe recurrent depression with psychosis (CMS-HCC) 05/08/2015    Onychomycosis 08/09/2016     Past Medical History:   Diagnosis Date    Anxiety     Arthritis     Breast cyst     Cancer (CMS-HCC)     Depression, psychotic (CMS-HCC)     Diabetes mellitus (CMS-HCC)     Hernia     History of transfusion     Hypertension     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/08/2015         FAMILY HISTORY (Pertinent):    Family History   Problem Relation Age of Onset    Breast cancer Daughter 12    Diabetes Mother     Glaucoma Father     Colon cancer Neg Hx     Ovarian cancer Neg Hx     Endometrial cancer Neg Hx     Anesthesia problems Neg Hx     Bleeding Disorder Neg Hx           SOCIAL HISTORY / SCREENINGS:  see screening and A/P above      PHYSICAL EXAM:  Vital Signs: There were no vitals taken for this visit.  Musculoskeletal: see above screening A/P  Psychiatric: see above screening A/P       Despina Pole, PharmD  PGY2 Pharmacy Resident, Ambulatory Care

## 2022-11-24 NOTE — Unmapped (Incomplete)
Personalized Prevention Plan    Medication Changes:  ***    Things to discuss with your primary care provider at your next visit:  ***  ***Advance Directive: Please review the [place] Advance Directive form. If you have questions, please write them down and discuss with your provider at the next visit. If you are ready to complete the form, please complete the form and have it notarized. Bring it back to your next visit so we can scan a copy to your record.    SCREENING & PREVENTION  RECOMMENDATIONS NEXT DATE DUE    Mammogram (women) Age 71 to 42 every 2 years or as recommended by most recent mammogram result. Not Indicated   Osteoporosis Screening   DEXA all women age 68-95, men 30-95 who are at risk. DEXA: Up to date   Diabetes Screening Screening: Age 82 to 8 with BMI 25.0 or more. Check fasting glucose or hemoglobin A1c every 3 years. Lab Results   Component Value Date    A1C 6.4 (H) 09/04/2022     Up To Date   Cardiovascular Event (i.e. heart attacks and strokes) Prevention     1. Lipid Screening    2. Aspirin Use   Use a low- to moderate-dose statin to prevent cardiovascular disease events for persons age 65 to 54 with 1 or more risk factors (dyslipidemia, diabetes, hypertension, or smoking) AND 10-year cardiovascular event risk of 10% or greater.     Reassess lipids (total cholesterol, LDL, and HDL) every 5 years. No need to recheck lipids if already taking a statin.    Aspirin age 34 to 68: Consider use of aspirin if 10-year cardiovascular event risk is 10% or greater and bleed risk is low.    Aspirin age 70 or older: Aspirin is not recommended unless you have known cardiovascular disease. Lipid Screening: Up To Date    2. Aspirin: Not Indicated         HIV Screening Age 87 to 64: Screen at least once. Retest at least annually if at increased risk.   Not Indicated   Hepatitis C Screening Age 71 to 9: Screen at least once. Retest periodically if at increased risk. No results found for: HEPCAB  ***   Colorectal Cancer Screening Age 69 to 75 stool cards yearly OR colonoscopy every 10 years (or more often if high risk per last result). Not Indicated   Lung Cancer Screening Age 47 to 11 years with 20 pack-year smoking history and currently smoke or have quit within the past 15 years: Shared decision making with primary care provider before initiating screening with low dose CT, then annual. ***   Obesity   All men and women; dietitian referral covered if BMI 30 or greater There is no height or weight on file to calculate BMI.  ***   Alcohol Use Screening Screening yearly Up to date  ***Please plan to discuss with your primary care provider at your next visit.   Tobacco Use Screening Screen at every visit Up to date  ***If positive enter .imcquitline below     Depression Screening Screening yearly ***Up to date     Staying up to date on immunizations is important for your health. Here are the records we have:  Immunization History   Administered Date(s) Administered    COVID-19 VACC,MRNA,(PFIZER)(PF) 08/31/2020, 09/21/2020    PNEUMOCOCCAL POLYSACCHARIDE 23-VALENT 06/04/2017       IMMUNIZATION RECOMMENDATION NEXT DUE DATE   Influenza Immunization Yearly ***Due now  ***Up to  date   Pneumococcal Immunizations                     Adults age 87 or older and no previous pneumoccocal vaccine or previous history unknown:   PCV20 alone OR  PCV15 followed by PPSV23 1 year later    Adults age 35 or older who have only received PPSV23:  1 dose of PCV15 or PCV20 administered at least 1 year after PPSV23    Adults age 9 or older who received PCV13 without PPSV23:  PPSV23 as previously recommended 1 year after PCV13  May provide 1 dose of PCV20 if PPSV23 is unavailable   *** due now  ***Up to date      Find PCV20 near you!  [email or web address]://www.powers-gomez.[email or web address]   Shingles Immunization Shingrix recommended age 36 years and older: 2 doses 2 to 6 months apart.     Covered by Medicare Part D ***Ask for this at your pharmacy  ***Up to date Tetanus Immunization One dose of   Tdap in lifetime. Td or Tdap  booster every   10 years (Medicare Part B does not cover for prevention).  ***Ask for this at your pharmacy  *** due ***   COVID-19 Immunization Recommended for everyone ages 54 and older within the scope of the particular vaccine. *** due ***

## 2022-11-26 DIAGNOSIS — G8929 Other chronic pain: Principal | ICD-10-CM

## 2022-11-26 DIAGNOSIS — M7502 Adhesive capsulitis of left shoulder: Principal | ICD-10-CM

## 2022-11-26 DIAGNOSIS — M25512 Pain in left shoulder: Principal | ICD-10-CM

## 2022-11-26 DIAGNOSIS — M25511 Pain in right shoulder: Principal | ICD-10-CM

## 2022-11-27 NOTE — Unmapped (Signed)
[facility] Family Medicine Population Health   Care Management Progress Note             Date of Service:  11/27/2022      Service:  Care Management - phone    Purpose of contact:         CM contacted pt regarding: Medicare AWV     #Medicare AWV  CM called pt to assist in rescheduling her Medicare AWV appointment with Lavell Anchors, CPP at 10 AM on 11/28 because pt would not have arrived on time. CM rescheduled pt for Monday 12/11 at 11 AM with Karalee Height, CPP at the [facility] location. Pt received both the HRA and Advance Directives sent by CM 2x months ago and plans to bring them to her visit.    Barriers to Care:  N/A    Provider/Care Partner(s)  to follow up on:   N/A    Health Maintenance:  Health Maintenance Due   Topic Date Due    Medicare Annual Wellness Visit (AWV)  Never done    Hepatitis C Screen  Never done    DTaP/Tdap/Td Vaccines (1 - Tdap) Never done    Zoster Vaccines (1 of 2) Never done    Pneumococcal Vaccine 65+ (2 - PCV) 06/04/2018    COVID-19 Vaccine (3 - Pfizer risk series) 10/19/2020    Urine Albumin/Creatinine Ratio  12/14/2020    Influenza Vaccine (1) Never done       Additional Information/Plan:  Patient provided my direct contact information and encouraged to contact me should additional needs arise.    Minutes spent providing outreach: 10    Rucker Pridgeon  [facility]  [facility] Health Care Manager - [facility] Fellow  [email or web address]  [phone]

## 2022-11-29 DIAGNOSIS — M25511 Pain in right shoulder: Principal | ICD-10-CM

## 2022-11-29 DIAGNOSIS — M25512 Pain in left shoulder: Principal | ICD-10-CM

## 2022-11-29 DIAGNOSIS — G8929 Other chronic pain: Principal | ICD-10-CM

## 2022-11-29 DIAGNOSIS — M7502 Adhesive capsulitis of left shoulder: Principal | ICD-10-CM

## 2022-12-04 DIAGNOSIS — G8929 Other chronic pain: Principal | ICD-10-CM

## 2022-12-04 DIAGNOSIS — M7502 Adhesive capsulitis of left shoulder: Principal | ICD-10-CM

## 2022-12-04 DIAGNOSIS — M25511 Pain in right shoulder: Principal | ICD-10-CM

## 2022-12-04 DIAGNOSIS — M25512 Pain in left shoulder: Principal | ICD-10-CM

## 2022-12-04 NOTE — Unmapped (Signed)
[facility] Family Medicine [facility] Health   Care Management Progress Note             Date of Service:  12/03/2022      Service:  Care Management - phone    Purpose of contact:  CM faxed PCP note for wheelchair to [facility] and emailed Erie Noe to ensure receipt.         Additional Information/Plan:  Patient provided my direct contact information and encouraged to contact me should additional needs arise.    Minutes spent providing outreach: 10    Tammy Braun  Uvaldo Bristle, [employer]  Population Health  [facility]

## 2022-12-06 ENCOUNTER — Institutional Professional Consult (permissible substitution): Admit: 2022-12-06 | Discharge: 2022-12-07 | Payer: MEDICARE

## 2022-12-06 DIAGNOSIS — F258 Other schizoaffective disorders: Principal | ICD-10-CM

## 2022-12-06 MED ADMIN — fluPHENAZine decanoate (PROLIXIN) injection 50 mg: 50 mg | INTRAMUSCULAR | @ 17:00:00

## 2022-12-06 NOTE — Unmapped (Signed)
[facility] Administered Medication Refill Coordination Note      NAME:Tammy Braun DOB: 02/14/1941      Medication: fluphenazine    Day Supply: 28 days      SHIPPING      Next delivery from [facility] [phone]) to  [facility]  for Tammy Braun is scheduled for:12/13    Clinic contact: [name]     Patient's next nurse visit for administration: 01/04.    We will follow up with clinic monthly for standard refill processing and delivery.      Antoni Stefan Samella Parr  Specialty Pharmacy Technician

## 2022-12-06 NOTE — Unmapped (Signed)
Administered long-acting injectable today,. See MAR for additional documentation. Mamye Bolds, CMA

## 2022-12-07 MED ORDER — FUROSEMIDE 20 MG TABLET
ORAL_TABLET | 0 refills | 0 days
Start: 2022-12-07 — End: ?

## 2022-12-10 DIAGNOSIS — N182 Chronic kidney disease, stage 2 (mild): Principal | ICD-10-CM

## 2022-12-10 DIAGNOSIS — E1149 Type 2 diabetes mellitus with other diabetic neurological complication: Principal | ICD-10-CM

## 2022-12-10 NOTE — Unmapped (Signed)
[facility] Family Medicine Population Health   Care Management Progress Note             Date of Service:  12/10/2022      Outcome:  Phone outreach completed    Purpose of contact:  Erie Noe from [employer] says claim for wheelchair was denied due to no signature by PCP to signify note had addendum. Erie Noe hopes to have wheelchair delivered to patient before christmas. Note was refaxed with addendum and electronic signature from PCP present. Email sent to [facility] for confirmation of receipt. Receipt confirmed.    Barriers to Care:  N/A    Health Maintenance:  Health Maintenance Due   Topic Date Due    Medicare Annual Wellness Visit (AWV)  Never done    Hepatitis C Screen  Never done    DTaP/Tdap/Td Vaccines (1 - Tdap) Never done    Zoster Vaccines (1 of 2) Never done    Lung Cancer Screening Shared Decision Making  Never done    Pneumococcal Vaccine 65+ (2 - PCV) 06/04/2018    COVID-19 Vaccine (3 - Pfizer risk series) 10/19/2020    Urine Albumin/Creatinine Ratio  12/14/2020    Influenza Vaccine (1) Never done       Additional Information/Plan:  Patient provided my direct contact information and encouraged to contact me should additional needs arise.    Tammy Braun  Tammy Braun  Population Health  [facility] Family Medicine

## 2022-12-10 NOTE — Unmapped (Unsigned)
Annual Wellness Visit:    ASSESSMENT/PLAN TO ADDRESS SCREENING, RISK FACTORS, AND PERSONAL PREVENTION PLAN:    Follow-up for PCP (concerns identified or screenings that patient preferred to discuss with PCP first):  {AWV PCP follow-up:81582}    Medication changes today:  ***    Orders Placed today:  No orders of the defined types were placed in this encounter.      Personalized Prevention Plan: A personalized prevention plan was reviewed with the patient, appropriate referrals were made, and the patient was given a copy for their personal records as a part of the After Visit Summary available in Epic.       HEALTH RISK SCREENING, ASSESSMENT/PLAN:  1. Tobacco Use Screen: {Blank single:19197::No concerns identified based on screening,Tobacco use concerns identified.  Patient was referred to Quitline for support,Tobacco use concerns identified.  Patient was referred to Quitline for support as well as offered additional handouts with local resources}  Tobacco Use: High Risk (11/06/2022)    Patient History     Smoking Tobacco Use: Every Day     Smokeless Tobacco Use: Never     Passive Exposure: Not on file         2. Unhealthy Alcohol Use Screen:   Alcohol Use: Not on file       AUDIT-C score: ***    The AUDIT-C is scored on a scale of 0-12 (scores of 0 reflect no alcohol use). In men, a score of 4 or more is considered positive; in women, a score of 3 or more is considered positive. Generally, the higher the AUDIT-C score, the more likely it is that the patient's drinking is affecting his/her health and safety.    {Blank single:19197::No concerns identified based on screening,Alcohol use concerns identified.  Patient was encouraged to discuss alcohol use at their next PCP visit.}    3. Nutrition:     Food Insecurity: No Food Insecurity (05/11/2022)    Hunger Vital Sign     Worried About Running Out of Food in the Last Year: Never true     Ran Out of Food in the Last Year: Never true     There is no height or weight on file to calculate BMI.  {Blank single:19197::No concerns identified based on screening,Referral placed to dietitian for obesity,Patient eligible for dietitian visits for obesity, but patient declined today. Reassess at future visits.,Food resources provided in AVS today}    4. Physical Activity:  Physical Activity: Insufficiently Active (04/09/2018)    Exercise Vital Sign     Days of Exercise per Week: 3 days     Minutes of Exercise per Session: 30 min       Plan: {Blank single:19197::Pt advised to exercise 30 minutes most days of the week,Pt advised to continue exercising at least 30 minutes most days of the week}    5. Substance Use  Substance use screening completed in SDOH module today. {Blank single:19197::No concerns identified based on screening,Recommended follow-up with PCP for full risk assessment}    ***Patient has been identified on as currently using a prescribed opioid medication or an illicit drug.   {AWV opioids:59017}    6. Advanced Care Planning: {Blank single:19197::Patient has an advanced directive, Patient identified they would like information on advanced care planning and was given the [place] Advance Directive form.  Patient was informed that a future visit can be made to discuss this information further, Patient not interested in advanced care planning at this time}. Health care decision maker was reviewed and  updated today.    Advance Care Planning     Participants: ***  Discussion/Action: ***    Health Care Decision Maker as of 12/10/2022    HCDM (patient stated preference): [name] - Daughter - [phone]                   7. General Health: {Blank single:19197::Excellent,Very good,Good,Fair,Poor}    Mouth and teeth condition: {Blank single:19197::Excellent,Very good,Good,Fair,Poor}     8. Functional Ability/Safety Screen:      > Home Safety Evaluation: {Blank single:19197::No concerns identified based on screening,Concerns Identified: Patient referred to [employer]}    ***if Training and development officer education needed enter .IMCResourceFireSafety on AVS        > Functional Evaluation and ADL Assessment:: {Blank single:19197::No concerns identified based on screening questionnaire. Patient able to adequately perform ADLs,Pt unable to perform ADLs: will schedule f/u with patient and caregiver to discuss further interventions.  Patient referred to [employer] on Aging,Pt is homebound and a referral was made to [facility] for in home evaluation,Pt is not homebound and a referral was made to a community resource for in home evaluation}    ***if [facility] referral, patient must have face-to-face with MD within previous 3 months                   > Fall risk:   Fall Assessment        {Blank single:19197::No concerns identified based on screening and Get Up And Go score of <12 seconds,Fall risk identified. Get Up and Go score of >/= 12 seconds. Patient referred to physical therapy}   Timed-up and Go Observations requiring PCP evaluation: {Blank single:19197::Slow tenative pace,Loss of balance,Short strides,Little or no arm swing,Steadying self on walls,Shuffling,En bloc turning,Not using assistive device properly}  ***If falls positive use .fallspositive        > Hearing Assessment: Hearing HHIE-S score:  ***; {Blank single:19197::No concerns identified based on screening, HHIE-S score 0 to 8,Hearing handicap identified on HHIE-S screening score of 10-40, patient referred to audiology,Hearing handicap identified on HHIE-S screening score of 10-40, patient declined referral to audiology,Patient already sees an audiologist}             9. Cognition screen:   Step 1: Please listen carefully, I am going to say three words that I want you to repeat back to me now and try to remember. The words are {Blank single:19197::banana, sunrise, chair,leader, season, table,village, kitchen, baby}. Please say them for me know.   Step 2: Next, I want you to draw a clock for me. First, put all of the numbers where they go. When that is complete, say Now, set the hands to 10 past 11.  Step 3: What were the three words I asked you to recall?    Scoring:  Word recall: 1 point for each word spontaneously recalled without cueing  Clock: 2 points for normal clock with numbers correct sequence and position. 0 points for inability or refusal to draw clock.    ***Add photo of clock to note using media button if abnormal.    Result: ***; {Blank single:19197::No concerns identified based on screening,Concerning for dementia or other cognitive deficit (Score</=3).  Schedule f/u appt within 4 weeks with PCP and patient/patient's family for further evaluation.}     10. Depression screen: PHQ-9 score: ***; {vlnphq9severity:30189}, {Blank single:19197::No concerns identified based on screening,Mild depression identified on screening, no intervention was required at this time,Moderate depression identified on screening: Patient was offered  counseling,Severe depression identified on screening: Patient was offered counseling.  Additionally, medication titration was performed,???Severe depression identified on screening: Patient was offered counseling and referred to PCP for medication titration}       11. Screening Studies  See Personalized Prevention Plan in AVS    12. The 10-year ASCVD risk score (Arnett DK, et al., 2019) is: 52.8%    Values used to calculate the score:      Age: 67 years      Sex: Female      Is Non-Hispanic African American: Yes      Diabetic: Yes      Tobacco smoker: Yes      Systolic Blood Pressure: 130 mmHg      Is BP treated: Yes      HDL Cholesterol: 51 mg/dL      Total Cholesterol: 157 mg/dL    Note: For patients with SBP <90 or >200, Total Cholesterol <130 or >320, HDL <20 or >100 which are outside of the allowable range, the calculator will use these upper or lower values to calculate the patient???s risk score.    {Blank single:19197::already on statin,up-to-date on lipid screening,lipid screening ordered today,deprescribed aspirin,continue aspirin given ascvd risk,ASCVD secondary prevention medications reviewed today,***}    REASON FOR VISIT: Annual Wellness Visit    LIST OF CURRENT PROVIDERS:  Patient Care Team:  Vanetta Shawl, MD as PCP - General (Family Medicine)  Vanetta Shawl, MD as PCP - Lynetta Mare, Erskin Burnet as Nurse Practitioner (Cardiology)  Hessie Dibble, Ruta Hinds, MD as Consulting Physician (Cardiology)  Flavia Shipper, LCSWA as Care Manager  Ward Givens, MD as Resident (Psychiatry)      ALLERGIES: Reviewed and Updated in EPIC:   Allergies   Allergen Reactions    Lisinopril Anaphylaxis and Swelling    Losartan Dizziness    Hctz [Hydrochlorothiazide]      SIADH     MEDICATIONS: Reviewed and Updated in EPIC  Discrepancies: ***  Adherence Concerns: ***    PAST MEDICAL / SURGICAL HISTORY:  Active Ambulatory Problems     Diagnosis Date Noted    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    Senile nuclear sclerosis 05/23/2012    DM (diabetes mellitus), 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    HTN (hypertension) 07/18/2011    Malignant neoplasm of bladder (CMS-HCC) 03/23/2011    Tobacco use disorder 07/18/2011    Hernia, diaphragmatic 07/09/2013    Hyponatremia 08/03/2013    Hip arthritis mild 05/12/2014    Chronic renal disease, stage 2, mildly decreased glomerular filtration rate (GFR) between 60-89 mL/min/1.73 square meter 06/16/2014    Peripheral neuropathy 07/07/2014    Hypercholesterolemia refuses meds 04/28/2015    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    Schizoaffective disorder (CMS-HCC) 05/08/2015    Onychomycosis of multiple toenails with type 2 diabetes mellitus and peripheral neuropathy (CMS-HCC) 08/09/2016    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 11/07/2016    At maximum risk for fall 03/06/2017    Right hip pain 04/22/2017    Tinea versicolor 10/28/2017    Disorder of left rotator cuff 02/13/2018    Spinal stenosis of cervical region 02/13/2018    Left shoulder pain 02/20/2018    Articular cartilage disorder of shoulder region, left 02/20/2018    Tinnitus 01/13/2019    Headache 06/24/2020    Bradycardia 06/27/2020    Skin breakdown 06/30/2020    Influenza vaccination declined 09/15/2020    Chronic tension-type  headache, not intractable 09/15/2020    Osteoarthritis of cervical spine 09/15/2020    Arthralgia of both ankles 09/15/2020    Frequent PVCs 09/21/2020    Carotid arterial disease (CMS-HCC) < 40% bilaterally 2021 12/08/2020    Primary hypertension 09/27/2021    Nonrheumatic tricuspid valve regurgitation 09/27/2021    Pulmonary HTN (CMS-HCC) 04/11/2022    Pyelitis 05/10/2022    Age-related osteoporosis without current pathological fracture 09/14/2022    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) 10/18/2022     Resolved Ambulatory Problems     Diagnosis Date Noted    Depression, major, 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    Encounter for screening mammogram for high-risk patient 12/09/2012    Dizziness 07/08/2013    Nausea vomiting and diarrhea 08/03/2013    Thrombocytopenia (CMS-HCC) 08/11/2013    Colon cancer screening sigmoid '16 ok 03/15/2015    Type 2 diabetes mellitus (CMS-HCC) 05/05/2015    Severe recurrent depression with psychosis (CMS-HCC) 05/08/2015    Onychomycosis 08/09/2016     Past Medical History:   Diagnosis Date    Anxiety     Arthritis     Breast cyst     Cancer (CMS-HCC)     Depression, psychotic (CMS-HCC)     Diabetes mellitus (CMS-HCC)     Hernia     History of transfusion     Hypertension     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/08/2015         FAMILY HISTORY (Pertinent):    Family History   Problem Relation Age of Onset    Breast cancer Daughter 59    Diabetes Mother     Glaucoma Father     Colon cancer Neg Hx     Ovarian cancer Neg Hx     Endometrial cancer Neg Hx     Anesthesia problems Neg Hx     Bleeding Disorder Neg Hx           SOCIAL HISTORY / SCREENINGS:  see screening and A/P above      PHYSICAL EXAM:  Vital Signs: There were no vitals taken for this visit.  Musculoskeletal: see above screening A/P  Psychiatric: see above screening A/P

## 2022-12-10 NOTE — Unmapped (Incomplete)
Personalized Prevention Plan    Medication Changes:  ***    Things to discuss with your primary care provider at your next visit:  ***  ***Advance Directive: Please review the [place] Advance Directive form. If you have questions, please write them down and discuss with your provider at the next visit. If you are ready to complete the form, please complete the form and have it notarized. Bring it back to your next visit so we can scan a copy to your record.    SCREENING & PREVENTION  RECOMMENDATIONS NEXT DATE DUE    Mammogram (women) Age 58 to 33 every 2 years or as recommended by most recent mammogram result. ***   Osteoporosis Screening   DEXA all women age 38-95, men 108-95 who are at risk. DEXA:  Up to date, next due 09/21/27   Diabetes Screening Screening: Age 62 to 34 with BMI 25.0 or more. Check fasting glucose or hemoglobin A1c every 3 years. Lab Results   Component Value Date    A1C 6.4 (H) 09/04/2022      Cardiovascular Event (i.e. heart attacks and strokes) Prevention     1. Lipid Screening    2. Aspirin Use   Use a low- to moderate-dose statin to prevent cardiovascular disease events for persons age 34 to 2 with 1 or more risk factors (dyslipidemia, diabetes, hypertension, or smoking) AND 10-year cardiovascular event risk of 10% or greater.     Reassess lipids (total cholesterol, LDL, and HDL) every 5 years. No need to recheck lipids if already taking a statin.    Aspirin age 29 to 13: Consider use of aspirin if 10-year cardiovascular event risk is 10% or greater and bleed risk is low.    Aspirin age 76 or older: Aspirin is not recommended unless you have known cardiovascular disease. Lipid Screening: ***    2. Aspirin         HIV Screening Age 71 to 74: Screen at least once. Retest at least annually it at increased risk. No results found for: HIV12AB, HIVAGAB  ***   Hepatitis C Screening Age 32 to 56: Screen at least once. Retest periodically if at increased risk. No results found for: HEPCAB  *** Colorectal Cancer Screening Age 30 to 75 stool cards yearly OR colonoscopy every 10 years (or more often if high risk per last result). ***   Lung Cancer Screening Age 60 to 80 years with 20 pack-year smoking history and currently smoke or have quit within the past 15 years: Shared decision making with primary care provider before initiating screening with low dose CT, then annual. ***   Obesity   All men and women; dietitian referral covered if BMI 30 or greater There is no height or weight on file to calculate BMI.  ***   Alcohol Use Screening Screening yearly Up to date  ***Please plan to discuss with your primary care provider at your next visit.   Tobacco Use Screening Screen at every visit Up to date  ***If positive enter .imcquitline below     Depression Screening Screening yearly ***Up to date     Staying up to date on immunizations is important for your health. Here are the records we have:  Immunization History   Administered Date(s) Administered    COVID-19 VACC,MRNA,(PFIZER)(PF) 08/31/2020, 09/21/2020    PNEUMOCOCCAL POLYSACCHARIDE 23-VALENT 06/04/2017       IMMUNIZATION RECOMMENDATION NEXT DUE DATE   Influenza Immunization Yearly Due now     Pneumococcal Immunizations  Adults age 30 or older and no previous pneumoccocal vaccine or previous history unknown:   PCV20 alone OR  PCV15 followed by PPSV23 1 year later    Adults age 36 or older who have only received PPSV23:  1 dose of PCV15 or PCV20 administered at least 1 year after PPSV23    Adults age 16 or older who received PCV13 without PPSV23:  PPSV23 as previously recommended 1 year after PCV13  May provide 1 dose of PCV20 if PPSV23 is unavailable   PCV20 due now      Find PCV20 near you!  [email or web address]://www.powers-gomez.info/   Shingles Immunization Shingrix recommended age 53 years and older: 2 doses 2 to 6 months apart.     Covered by Medicare Part D Ask for this at your pharmacy     Tetanus Immunization One dose of   Tdap in lifetime. Td or Tdap  booster every   10 years (Medicare Part B does not cover for prevention).  Double check your records and ask for this at your pharmacy   COVID-19 Immunization Recommended for everyone ages 74 and older within the scope of the particular vaccine. Booster due now

## 2022-12-11 DIAGNOSIS — M25512 Pain in left shoulder: Principal | ICD-10-CM

## 2022-12-11 DIAGNOSIS — M7502 Adhesive capsulitis of left shoulder: Principal | ICD-10-CM

## 2022-12-11 DIAGNOSIS — G8929 Other chronic pain: Principal | ICD-10-CM

## 2022-12-11 DIAGNOSIS — M25511 Pain in right shoulder: Principal | ICD-10-CM

## 2022-12-12 ENCOUNTER — Ambulatory Visit: Admit: 2022-12-12 | Discharge: 2022-12-13 | Payer: MEDICARE

## 2022-12-12 DIAGNOSIS — N182 Chronic kidney disease, stage 2 (mild): Principal | ICD-10-CM

## 2022-12-12 DIAGNOSIS — E1149 Type 2 diabetes mellitus with other diabetic neurological complication: Principal | ICD-10-CM

## 2022-12-12 LAB — CBC
HEMATOCRIT: 38.7 % (ref 34.0–44.0)
HEMOGLOBIN: 12.6 g/dL (ref 11.3–14.9)
MEAN CORPUSCULAR HEMOGLOBIN CONC: 32.6 g/dL (ref 32.0–36.0)
MEAN CORPUSCULAR HEMOGLOBIN: 29.6 pg (ref 25.9–32.4)
MEAN CORPUSCULAR VOLUME: 90.6 fL (ref 77.6–95.7)
MEAN PLATELET VOLUME: 8.2 fL (ref 6.8–10.7)
PLATELET COUNT: 248 10*9/L (ref 150–450)
RED BLOOD CELL COUNT: 4.27 10*12/L (ref 3.95–5.13)
RED CELL DISTRIBUTION WIDTH: 15 % (ref 12.2–15.2)
WBC ADJUSTED: 6.1 10*9/L (ref 3.6–11.2)

## 2022-12-12 LAB — COMPREHENSIVE METABOLIC PANEL
ALBUMIN: 3.6 g/dL (ref 3.4–5.0)
ALKALINE PHOSPHATASE: 92 U/L (ref 46–116)
ALT (SGPT): 16 U/L (ref 10–49)
ANION GAP: 7 mmol/L (ref 5–14)
AST (SGOT): 17 U/L (ref <=34)
BILIRUBIN TOTAL: 0.3 mg/dL (ref 0.3–1.2)
BLOOD UREA NITROGEN: 20 mg/dL (ref 9–23)
BUN / CREAT RATIO: 19
CALCIUM: 9.4 mg/dL (ref 8.7–10.4)
CHLORIDE: 105 mmol/L (ref 98–107)
CO2: 26 mmol/L (ref 20.0–31.0)
CREATININE: 1.05 mg/dL — ABNORMAL HIGH
EGFR CKD-EPI (2021) FEMALE: 54 mL/min/{1.73_m2} — ABNORMAL LOW (ref >=60)
GLUCOSE RANDOM: 106 mg/dL (ref 70–179)
POTASSIUM: 4.8 mmol/L (ref 3.4–4.8)
PROTEIN TOTAL: 7.5 g/dL (ref 5.7–8.2)
SODIUM: 138 mmol/L (ref 135–145)

## 2022-12-12 LAB — HEMOGLOBIN A1C
ESTIMATED AVERAGE GLUCOSE: 131 mg/dL
HEMOGLOBIN A1C: 6.2 % — ABNORMAL HIGH (ref 4.8–5.6)

## 2022-12-12 MED ORDER — FUROSEMIDE 20 MG TABLET
ORAL_TABLET | 0 refills | 0 days | Status: CP
Start: 2022-12-12 — End: ?

## 2022-12-12 MED FILL — FLUPHENAZINE DECANOATE 25 MG/ML INJECTION SOLUTION: INTRAMUSCULAR | 28 days supply | Qty: 5 | Fill #1

## 2022-12-12 NOTE — Unmapped (Signed)
Chief Complaint   Patient presents with    Follow-up     Recent testing   Pt declined flu shot at this time        ASSESSMENT/PLAN:    Problem List Items Addressed This Visit          Endocrine    DM (diabetes mellitus), type 2 with neurological complications (CMS-HCC) - Primary    Relevant Orders    Comprehensive Metabolic Panel (Completed)    CBC (Completed)    Hemoglobin A1c (Completed)       Genitourinary    Chronic renal disease, stage 2, mildly decreased glomerular filtration rate (GFR) between 60-89 mL/min/1.73 square meter    Relevant Orders    Comprehensive Metabolic Panel (Completed)    CBC (Completed)       Labs   F/u in 2 months  To see nephrology  Looks good overall  Smoking cessation offered and declined  Updated problem list annotations  Database review and update  Med review and update  Lab review and update  Counseling provided and patient education    40 minutes with patient- 1/2 in face to face counseling on chronic health conditions and ostomy and kidney    SUBJECTIVE:    Tammy Braun is a 81 y.o. female that presents to clinic today regarding the following issues:    Check up  PMH + parastomal pain status post neoadjuvant chemotherapy and radical cystectomy with Ileal Conduit on 10/05/2011 for T2N0M0 bladder cancer.     PMH Chronic issues, including: COPD, Neuropathy, Chronic pain, HTN, DJD neck, hip, cervical stenosis, cervical radiculopathy, Chronic renal disease, Depression , Onychomycosis, Emphysema, Ca Trigone Bladder, Cervical Myofascial pain with chronic tension headaches with medication overuse headaches, left reverse total shoulder arthroplasty, large hiatal hernia, Pyelonephritis, Kidney stones     Also recent hydrenephrosis  Workup nephrology  Ultrasound showed: Mild bilateral hydronephrosis right worse than left. Bilateral renal cysts imilar to prior, small nonobstructing stone along the inferior collecting system measuring 0.4 cm.   Loopogram showed Reflux bilaterally. The bilateral renal collecting systems were moderately distended.      reports that she has been smoking cigarettes. She has a 42.8 pack-year smoking history. She has never used smokeless tobacco.    The following portions of the patient's history were reviewed and updated as appropriate: allergies, current medications, past family history, past medical history, past social history, past surgical history and problem list.    Review of systems for 10 organ systems conducted and no significant positives      OBJECTIVE:    Vital signs have been reviewed:   Vitals:    12/12/22 1107   BP: 127/77   Pulse: 66   Temp:

## 2022-12-13 DIAGNOSIS — G8929 Other chronic pain: Principal | ICD-10-CM

## 2022-12-13 DIAGNOSIS — M7502 Adhesive capsulitis of left shoulder: Principal | ICD-10-CM

## 2022-12-13 DIAGNOSIS — M25511 Pain in right shoulder: Principal | ICD-10-CM

## 2022-12-13 DIAGNOSIS — M25512 Pain in left shoulder: Principal | ICD-10-CM

## 2022-12-13 NOTE — Unmapped (Signed)
Form placed in the provider's box         Lovelace Cerveny

## 2022-12-19 ENCOUNTER — Encounter: Admit: 2022-12-19 | Payer: MEDICARE

## 2022-12-20 NOTE — Unmapped (Signed)
Form faxed  back to [phone]    [facility]

## 2023-01-02 NOTE — Unmapped (Signed)
[facility] Administered Medication Refill Coordination Note      NAME:Lakiah Mccauley Mergen DOB: 03/11/1941      Medication: fluphenazine decanoate    Day Supply: 28 days      SHIPPING      Next delivery from [facility] [phone]) to  [facility]  for Carden Krizman is scheduled for 01/05.    Clinic contact: [name]     Patient's next nurse visit for administration: 01/08.    We will follow up with clinic monthly for standard refill processing and delivery.      Delron Comer Samella Parr  Specialty Pharmacy Technician

## 2023-01-03 NOTE — Unmapped (Signed)
PA was approved for Spiriva Handihaler from 10/02/22 until further notice.

## 2023-01-04 MED FILL — FLUPHENAZINE DECANOATE 25 MG/ML INJECTION SOLUTION: INTRAMUSCULAR | 28 days supply | Qty: 5 | Fill #2

## 2023-01-07 ENCOUNTER — Institutional Professional Consult (permissible substitution): Admit: 2023-01-07 | Discharge: 2023-01-08 | Payer: MEDICARE

## 2023-01-07 DIAGNOSIS — F259 Schizoaffective disorder, unspecified: Principal | ICD-10-CM

## 2023-01-07 MED ADMIN — fluPHENAZine decanoate (PROLIXIN) injection 50 mg: 50 mg | INTRAMUSCULAR | @ 16:00:00

## 2023-01-07 NOTE — Unmapped (Signed)
After obtaining consent, and per Provider orders , injection of 50mg  given by Berniece Andreas, LPN. Patient tolerated well.

## 2023-01-16 ENCOUNTER — Ambulatory Visit: Admit: 2023-01-16 | Discharge: 2023-01-17 | Payer: MEDICARE

## 2023-01-16 DIAGNOSIS — C679 Malignant neoplasm of bladder, unspecified: Principal | ICD-10-CM

## 2023-01-16 NOTE — Unmapped (Signed)
ASSESSMENT AND PLAN:  Tammy Braun is a 82 y.o. female with parastomal pain status post neoadjuvant chemotherapy and radical cystectomy with Ileal Conduit on 10/05/2011 for T2N0M0 bladder cancer.    Given that she is over 5 years out from her cystectomy, she does not require further oncologic follow up. She has a stable parastomal hernia.     She was seen in the ED for pyleonephritis once. RUS at last visit confirmed near- resolution of hydronephrosis. Loopogram negative for stricture or obstruction (11/08/22).    She has had a UTI- indicated by a foul smell and difference in her mental status. I mentioned that she can start some probiotics- and if she does have a UTI again, she can get a culture at her [facility] - and we could consider prophylactic abx (perhaps macrobid daily) at that point. I would NOT get a culture without symptoms as it will always be positive (as she is colonized).     PLAN:  RTC in 1 year with RUS    It was a pleasure seeing this delightful woman in my clinic today.    ATTENDING ATTESTATION: I saw and evaluated the patient, participating in the key portions of the service.  I reviewed the resident???s note.  I agree with the resident???s findings and plan.     Donnella Bi, MD  Professor, Department of Urology    REASON FOR VISIT:   Follow-up status post cystectomy - and ED visit with pyelo several months ago    HISTORY OF PRESENT ILLNESS:  Tammy Braun is a 82 y.o. 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    We have seen her for many years. She has a known stomal/parastomal hernia which has been managed conservatively.     She no longer needs oncologic surveillance and is followed annually with renal ultrasound and laboratory studies. She was last seen in my clinic in 10/03/2022.    05/2020  CT scan - no evidence of disease, stable parastomal hernia, mild R hydroureter (consistent with known reflux with conduit).   10/03/2021 RUS - no hydro, bilateral nephrolithiasis, bilateral cysts  05/10/2022 ED visit for pyelonephritis; CT showed R hydroureter with urothelial enhancement suggestive of pyelitis, new left mild hydro  11/08/22  Loopogram - No filling defects or stricturing. Normal reflux bilaterally     She returns today and reports that she is doing well. Newt Lukes is here with her too (her daughter). She does have some foul-smelling urine occasionally but no other symptoms (other than once when she did have pyelo).    Past medical, surgical, family, and social history were reviewed and remain unchanged.    MEDICATIONS:   Current Outpatient Medications   Medication Sig Dispense Refill    acetaminophen (TYLENOL EXTRA STRENGTH) 500 MG tablet Take 2 tablets (1,000 mg total) by mouth every six (6) hours as needed for pain. 100 tablet 2    albuterol HFA 90 mcg/actuation inhaler Inhale 2 puffs every four (4) hours as needed for wheezing. 18 g 11    blood sugar diagnostic (ACCU-CHEK AVIVA PLUS TEST STRP) Strp by Other route daily at 10am. 50 strip 5    blood-glucose meter kit Use to check blood sugar four times daily or as directed by MD. Dx: Diabetes, E08.00 *DISPENSE ACCU-CHEK AVIVA PLUS BRAND* 1 each 0    candesartan (ATACAND)  32 MG tablet Take 1 tablet (32 mg total) by mouth daily. 30 tablet 11    cyclobenzaprine (FLEXERIL) 5 MG tablet Take 1 tablet (5 mg total) by mouth two (2) times a day as needed for muscle spasms. 60 tablet 5    fluPHENAZine decanoate (PROLIXIN) 25 mg/mL injection Inject 2 mL (50 mg total) into the muscle every thirty (30) days. 5 mL 11    furosemide (LASIX) 20 MG tablet TAKE 2 TABLETS BY MOUTH DAILY FOR 5 DAYS, THEN 1 TABLET DAILY 35 tablet 0    gabapentin (NEURONTIN) 100 MG capsule Take 1 capsule (100 mg total) by mouth nightly. 90 capsule 3    inhalational spacing device (E-Z SPACER) Spcr 1 each by Miscellaneous route every four (4) hours as needed. 1 each 0    lancets (ACCU-CHEK SOFTCLIX LANCETS) Misc 1 each by Miscellaneous route daily at 10am. 50 each 5    MEDICAL SUPPLY ITEM Entreal formula nutritionally supplement complete caloric density 3 application 3    senna (SENOKOT) 8.6 mg tablet Take 2 tablets by mouth nightly as needed for constipation. 60 tablet 0    tiotropium (SPIRIVA WITH HANDIHALER) 18 mcg inhalation capsule Place 1 capsule (18 mcg total) into inhaler and inhale daily as needed. 31 capsule 5    trolamine salicylate (ASPERCREME TOP) Apply 1 Application topically as needed (pain).       Current Facility-Administered Medications   Medication Dose Route Frequency Provider Last Rate Last Admin    fluPHENAZine decanoate (PROLIXIN) injection 50 mg  50 mg Intramuscular Q30 Days Lovett Calender, MD   50 mg at 01/07/23 1046     ALLERGIES:    Allergies   Allergen Reactions    Lisinopril Anaphylaxis and Swelling    Losartan Dizziness    Hctz [Hydrochlorothiazide]      SIADH     ROS:   Negative upon 10-system review other than what is mentioned in the HPI.     PHYSICAL EXAMINATION:   GENERAL: Pleasant female in no acute distress.   VITAL SIGNS: Blood pressure 187/86, pulse 70, temperature 36.7 ??C (98.1 ??F), temperature source Temporal, height 152.4 cm (5'), weight 59.3 kg (130 lb 11.2 oz), not currently breastfeeding.  PULMONARY: Normal work of breathing, no use of accessory muscles  ABDOMEN: Soft, non-tender, non-distended.  No organomegaly. Soft reducible peristomal hernia noted. Incision sites are clean, dry, intact. Stoma is prolapsed, but pink with clear urine in bag. Slightly whitish tinge to skin around stoma- normal appearing  PSYCHOLOGIC: Normal affect, normal mood    IMAGING:  FL Loopogram  Narrative: EXAM: FL LOOPOGRAM  DATE: 11/08/2022 12:56 PM  ACCESSION: [id number]  DICTATED: 11/08/2022 1:22 PM  INTERPRETATION LOCATION: MAIN CAMPUS    CLINICAL INDICATION: 82 years old Female with History of cystectomy, evlauate for reflux  - C67.9 - Malignant neoplasm of urinary bladder, unspecified site (CMS - HCC)      TECHNIQUE: Single contrast enema performed. A scout KUB was obtained. Utilizing sterile technique, the ileal conduit was cannulated and water soluble contrast was instilled via gravity drainage. Intermittent fluoroscopic and spot images of the conduit were obtained. A final overhead radiograph was obtained.    Fluoroscopy time was 1.6 minutes using a low dose system with pulsed fluoroscopy at 15 frames per second.    COMPARISON: CT abdomen and pelvis 05/10/2022.    FINDINGS:  Scout film demonstrates no abnormal calcifications. The bowel gas pattern is unremarkable. Multiple surgical clips throughout the abdomen and pelvis.  The ileal conduit is normal in appearance without strictures or gross masses.      Reflux was noted bilaterally. The bilateral renal collecting systems were moderately distended. No ureteral filling defects or stricturing.    On the post radiograph small volume contrast remains within the ileal conduit and collecting systems bilaterally.  Impression: Ileoconduit study with normal appearing conduit and reflux bilaterally into the moderately distended renal collecting systems, right greater than left. No filling defects or stricturing.    LABS:  Office Visit on 12/12/2022   Component Date Value Ref Range Status    Sodium 12/12/2022 138  135 - 145 mmol/L Final    Potassium 12/12/2022 4.8  3.4 - 4.8 mmol/L Final    Chloride 12/12/2022 105  98 - 107 mmol/L Final    CO2 12/12/2022 26.0  20.0 - 31.0 mmol/L Final    Anion Gap 12/12/2022 7  5 - 14 mmol/L Final    BUN 12/12/2022 20  9 - 23 mg/dL Final    Creatinine 56/21/3086 1.05 (H)  0.55 - 1.02 mg/dL Final    BUN/Creatinine Ratio 12/12/2022 19   Final    eGFR CKD-EPI (2021) Female 12/12/2022 54 (L)  >=60 mL/min/1.47m2 Final    eGFR calculated with CKD-EPI 2021 equation in accordance with [employer] and [employer] of Nephrology Task Force recommendations.    Glucose 12/12/2022 106  70 - 179 mg/dL Final    Calcium 57/84/6962 9.4  8.7 - 10.4 mg/dL Final    Albumin 95/28/4132 3.6  3.4 - 5.0 g/dL Final    Total Protein 12/12/2022 7.5  5.7 - 8.2 g/dL Final    Total Bilirubin 12/12/2022 0.3  0.3 - 1.2 mg/dL Final    AST 44/12/270 17  <=34 U/L Final    ALT 12/12/2022 16  10 - 49 U/L Final    Alkaline Phosphatase 12/12/2022 92  46 - 116 U/L Final    WBC 12/12/2022 6.1  3.6 - 11.2 10*9/L Final    RBC 12/12/2022 4.27  3.95 - 5.13 10*12/L Final    HGB 12/12/2022 12.6  11.3 - 14.9 g/dL Final    HCT 53/66/4403 38.7  34.0 - 44.0 % Final    MCV 12/12/2022 90.6  77.6 - 95.7 fL Final    MCH 12/12/2022 29.6  25.9 - 32.4 pg Final    MCHC 12/12/2022 32.6  32.0 - 36.0 g/dL Final    RDW 47/42/5956 15.0  12.2 - 15.2 % Final    MPV 12/12/2022 8.2  6.8 - 10.7 fL Final    Platelet 12/12/2022 248  150 - 450 10*9/L Final    Hemoglobin A1C 12/12/2022 6.2 (H)  4.8 - 5.6 % Final    Estimated Average Glucose 12/12/2022 131  mg/dL Final

## 2023-01-18 NOTE — Unmapped (Signed)
[facility] Population Health   Care Management Progress Note    Date: 01/18/2023  Outcome:  Phone outreach completed    Purpose of contact:         CM called pt regarding: Medicare AWV     #Medicare AWV  CM contacted pt to verify if she received and completed the HRA in preparation for her Medicare AWV appointment on 01/22 at 11 AM. Pt reports that she does not have the HRA. CM explained that the HRA must be completed prior to her AWV and offered to send another copy of the paperwork as well as reschedule the appointment. CM scheduled pt for 02/15 at 11 AM with Damita Dunnings, CPP.     CM encouraged pt to bring a copy of her living will as well so that it may be scanned into her chart. Pt is amenable to this plan.     Barriers to Care:  N/A    Provider/Care Partner(s) to follow up on:   N/A    Health Maintenance:  Health Maintenance Due   Topic Date Due    Medicare Annual Wellness Visit (AWV)  Never done    Hepatitis C Screen  Never done    DTaP/Tdap/Td Vaccines (1 - Tdap) Never done    Zoster Vaccines (1 of 2) Never done    Lung Cancer Screening Shared Decision Making  Never done    Pneumococcal Vaccine 65+ (2 - PCV) 06/04/2018    COVID-19 Vaccine (3 - Pfizer risk series) 10/19/2020    Urine Albumin/Creatinine Ratio  12/14/2020    Influenza Vaccine (1) Never done    Foot Exam  02/07/2023       Additional Information/Plan:  Patient provided my direct contact information and encouraged to contact me should additional needs arise.    Chart review performed prior to reaching out to patient.     Minutes spent providing outreach: 15    Norah Fick  [facility]  [facility] Health Care Manager - [employer] Fellow  [email or web address]  [phone]

## 2023-01-18 NOTE — Unmapped (Incomplete)
Personalized Prevention Plan    Medication Changes:  ***    Things to discuss with your primary care provider at your next visit:  ***  ***Advance Directive: Please review the [facility] Advance Directive form. If you have questions, please write them down and discuss with your provider at the next visit. If you are ready to complete the form, please complete the form and have it notarized. Bring it back to your next visit so we can scan a copy to your record.    SCREENING & PREVENTION  RECOMMENDATIONS NEXT DATE DUE    AAA Ultrasound  (men) Men ages 23 to 76 and history of smoking. ***   Prostate Cancer Screening with PSA (men) Men ages 58 to 72: decision to screen is personal and should be discussed with your primary care provider. If decision is made to screen, screening should be annual. ***Discuss with your primary care provider at your next visit.    No results found for: PSASCRN, PSADIAG    ***PSA ordered     ***PSA due ***   Mammogram (women) Age 92 to 70 every 2 years or as recommended by most recent mammogram result. ***   Osteoporosis Screening   DEXA all women age 39-95, men 84-95 who are at risk. DEXA: *** Up to date, ***Ordered today   Diabetes Screening Screening: Age 44 to 29 with BMI 25.0 or more. Check fasting glucose or hemoglobin A1c every 3 years. Lab Results   Component Value Date    A1C 6.2 (H) 12/12/2022     ***   Cardiovascular Event (i.e. heart attacks and strokes) Prevention     1. Lipid Screening    2. Aspirin Use   Use a low- to moderate-dose statin to prevent cardiovascular disease events for persons age 38 to 43 with 1 or more risk factors (dyslipidemia, diabetes, hypertension, or smoking) AND 10-year cardiovascular event risk of 10% or greater.     Reassess lipids (total cholesterol, LDL, and HDL) every 5 years. No need to recheck lipids if already taking a statin.    Aspirin age 71 to 39: Consider use of aspirin if 10-year cardiovascular event risk is 10% or greater and bleed risk is low.    Aspirin age 14 or older: Aspirin is not recommended unless you have known cardiovascular disease. Lipid Screening: ***    2. ***Aspirin         HIV Screening Age 41 to 87: Screen at least once. Retest at least annually it at increased risk. No results found for: HIV12AB, HIVAGAB  ***   Hepatitis C Screening Age 78 to 44: Screen at least once. Retest periodically if at increased risk. No results found for: HEPCAB  ***   Colorectal Cancer Screening Age 69 to 75 stool cards yearly OR colonoscopy every 10 years (or more often if high risk per last result). ***   Lung Cancer Screening Age 49 to 80 years with 20 pack-year smoking history and currently smoke or have quit within the past 15 years: Shared decision making with primary care provider before initiating screening with low dose CT, then annual. ***   Obesity   All men and women; dietitian referral covered if BMI 30 or greater There is no height or weight on file to calculate BMI.  ***   Alcohol Use Screening Screening yearly Up to date  ***Please plan to discuss with your primary care provider at your next visit.   Tobacco Use Screening Screen at every visit Up  to date  ***If positive enter .imcquitline below     Depression Screening Screening yearly ***Up to date     Staying up to date on immunizations is important for your health. Here are the records we have:  Immunization History   Administered Date(s) Administered    COVID-19 VACC,MRNA,(PFIZER)(PF) 08/31/2020, 09/21/2020    PNEUMOCOCCAL POLYSACCHARIDE 23-VALENT 06/04/2017       IMMUNIZATION RECOMMENDATION NEXT DUE DATE   Influenza Immunization Yearly ***Due now  ***Up to date   Pneumococcal Immunizations                     Adults age 21 or older and no previous pneumoccocal vaccine or previous history unknown:   PCV20 alone OR  PCV15 followed by PPSV23 1 year later    Adults age 6 or older who have only received PPSV23:  1 dose of PCV15 or PCV20 administered at least 1 year after PPSV23    Adults age 45 or older who received PCV13 without PPSV23:  PPSV23 as previously recommended 1 year after PCV13  May provide 1 dose of PCV20 if PPSV23 is unavailable   *** due now  ***Up to date      Find PCV20 near you!  [email or web address]   Shingles Immunization Shingrix recommended age 40 years and older: 2 doses 2 to 6 months apart.     Covered by Medicare Part D ***Ask for this at your pharmacy  ***Up to date   Tetanus Immunization One dose of   Tdap in lifetime. Td or Tdap  booster every   10 years (Medicare Part B does not cover for prevention).  ***Ask for this at your pharmacy  *** due ***   COVID-19 Immunization Recommended for everyone ages 26 and older within the scope of the particular vaccine. *** due ***

## 2023-01-18 NOTE — Unmapped (Unsigned)
Annual Wellness Visit:    ASSESSMENT/PLAN TO ADDRESS SCREENING, RISK FACTORS, AND PERSONAL PREVENTION PLAN:    Follow-up for PCP (concerns identified or screenings that patient preferred to discuss with PCP first):  {AWV PCP follow-up:81582}    Medication changes today:  ***    Orders Placed today:  No orders of the defined types were placed in this encounter.      Personalized Prevention Plan: A personalized prevention plan was reviewed with the patient, appropriate referrals were made, and the patient was given a copy for their personal records as a part of the After Visit Summary available in Epic.       HEALTH RISK SCREENING, ASSESSMENT/PLAN:  1. Tobacco Use Screen: {Blank single:19197::No concerns identified based on screening,Tobacco use concerns identified.  Patient was referred to [facility]uit[facility] for support,Tobacco use concerns identified.  Patient was referred to [facility]uit[facility] for support as well as offered additional handouts with local resources}  Tobacco Use: High Risk (01/16/2023)    Patient History     Smoking Tobacco Use: Every Day     Smokeless Tobacco Use: Never     Passive Exposure: Not on file         2. Unhealthy Alcohol Use Screen:   Alcohol Use: Not on file       AUDIT-C score: ***    The AUDIT-C is scored on a scale of 0-12 (scores of 0 reflect no alcohol use). In men, a score of 4 or more is considered positive; in women, a score of 3 or more is considered positive. Generally, the higher the AUDIT-C score, the more likely it is that the patient's drinking is affecting his/her health and safety.    {Blank single:19197::No concerns identified based on screening,Alcohol use concerns identified.  Patient was encouraged to discuss alcohol use at their next PCP visit.}    3. Nutrition:     Food Insecurity: No Food Insecurity (12/12/2022)    Hunger Vital Sign     Worried About Running Out of Food in the Last Year: Never true     Ran Out of Food in the Last Year: Never true     There is no height or weight on file to calculate BMI.  {Blank single:19197::No concerns identified based on screening,Referral placed to dietitian for obesity,Patient eligible for dietitian visits for obesity, but patient declined today. Reassess at future visits.,Food resources provided in AVS today}    4. Physical Activity:  Physical Activity: Insufficiently Active (04/09/2018)    Exercise Vital Sign     Days of Exercise per Week: 3 days     Minutes of Exercise per Session: 30 min       Plan: {Blank single:19197::Pt advised to exercise 30 minutes most days of the week,Pt advised to continue exercising at least 30 minutes most days of the week}    5. Substance Use  Substance use screening completed in SDOH module today. {Blank single:19197::No concerns identified based on screening,Recommended follow-up with PCP for full risk assessment}    ***Patient has been identified on as currently using a prescribed opioid medication or an illicit drug.   {AWV opioids:59017}    6. Advanced Care Planning: {Blank single:19197::Patient has an advanced directive, Patient identified they would like information on advanced care planning and was given the [place] Advance Directive form.  Patient was informed that a future visit can be made to discuss this information further, Patient not interested in advanced care planning at this time}. Health care decision maker was reviewed and  updated today.    Advance Care Planning     Participants: ***  Discussion/Action: ***    Health Care Decision Maker as of 01/18/2023    HCDM (patient stated preference): [name] - Daughter - [phone]                   7. General Health: {Blank single:19197::Excellent,Very good,Good,Fair,Poor}    Mouth and teeth condition: {Blank single:19197::Excellent,Very good,Good,Fair,Poor}     8. Functional Ability/Safety Screen:      > Home Safety Evaluation: {Blank single:19197::No concerns identified based on screening,Concerns Identified: Patient referred to [employer]}    ***if Training and development officer education needed enter .IMCResourceFireSafety on AVS        > Functional Evaluation and ADL Assessment:: {Blank single:19197::No concerns identified based on screening questionnaire. Patient able to adequately perform ADLs,Pt unable to perform ADLs: will schedule f/u with patient and caregiver to discuss further interventions.  Patient referred to [employer] on Aging,Pt is homebound and a referral was made to [facility] for in home evaluation,Pt is not homebound and a referral was made to a community resource for in home evaluation}    ***if [facility] referral, patient must have face-to-face with MD within previous 3 months                   > Fall risk:   Fall Assessment        {Blank single:19197::No concerns identified based on screening and Get Up And Go score of <12 seconds,Fall risk identified. Get Up and Go score of >/= 12 seconds. Patient referred to physical therapy}   Timed-up and Go Observations requiring PCP evaluation: {Blank single:19197::Slow tenative pace,Loss of balance,Short strides,Little or no arm swing,Steadying self on walls,Shuffling,En bloc turning,Not using assistive device properly}  ***If falls positive use .fallspositive        > Hearing Assessment: Hearing HHIE-S score:  ***; {Blank single:19197::No concerns identified based on screening, HHIE-S score 0 to 8,Hearing handicap identified on HHIE-S screening score of 10-40, patient referred to audiology,Hearing handicap identified on HHIE-S screening score of 10-40, patient declined referral to audiology,Patient already sees an audiologist}             9. Cognition screen:   Step 1: Please listen carefully, I am going to say three words that I want you to repeat back to me now and try to remember. The words are {Blank single:19197::banana, sunrise, chair,leader, season, table,village, kitchen, baby}. Please say them for me know.   Step 2: Next, I want you to draw a clock for me. First, put all of the numbers where they go. When that is complete, say Now, set the hands to 10 past 11.  Step 3: What were the three words I asked you to recall?    Scoring:  Word recall: 1 point for each word spontaneously recalled without cueing  Clock: 2 points for normal clock with numbers correct sequence and position. 0 points for inability or refusal to draw clock.    ***Add photo of clock to note using media button if abnormal.    Result: ***; {Blank single:19197::No concerns identified based on screening,Concerning for dementia or other cognitive deficit (Score</=3).  Schedule f/u appt within 4 weeks with PCP and patient/patient's family for further evaluation.}     10. Depression screen: PHQ-9 score: ***; {vlnphq9severity:30189}, {Blank single:19197::No concerns identified based on screening,Mild depression identified on screening, no intervention was required at this time,Moderate depression identified on screening: Patient was offered  counseling,Severe depression identified on screening: Patient was offered counseling.  Additionally, medication titration was performed,???Severe depression identified on screening: Patient was offered counseling and referred to PCP for medication titration}       11. Screening Studies  See Personalized Prevention Plan in AVS    12. The 10-year ASCVD risk score (Arnett DK, et al., 2019) is: 68.5%    Values used to calculate the score:      Age: 82 years      Sex: Female      Is Non-Hispanic African American: Yes      Diabetic: Yes      Tobacco smoker: Yes      Systolic Blood Pressure: 187 mmHg      Is BP treated: Yes      HDL Cholesterol: 51 mg/dL      Total Cholesterol: 157 mg/dL    Note: For patients with SBP <90 or >200, Total Cholesterol <130 or >320, HDL <20 or >100 which are outside of the allowable range, the calculator will use these upper or lower values to calculate the patient???s risk score.    {Blank single:19197::already on statin,up-to-date on lipid screening,lipid screening ordered today,deprescribed aspirin,continue aspirin given ascvd risk,ASCVD secondary prevention medications reviewed today,***}    REASON FOR VISIT: Annual Wellness Visit    LIST OF CURRENT PROVIDERS:  Patient Care Team:  Vanetta Shawl, MD as PCP - General (Family Medicine)  Vanetta Shawl, MD as PCP - Lynetta Mare, Erskin Burnet as Nurse Practitioner (Cardiology)  Hessie Dibble, Ruta Hinds, MD as Consulting Physician (Cardiology)  Flavia Shipper, LCSWA as Care Manager  Ward Givens, MD as Resident (Psychiatry)  ***    ALLERGIES: Reviewed and Updated in EPIC:   Allergies   Allergen Reactions    Lisinopril Anaphylaxis and Swelling    Losartan Dizziness    Hctz [Hydrochlorothiazide]      SIADH     MEDICATIONS: Reviewed and Updated in EPIC  Discrepancies: ***  Adherence Concerns: ***    PAST MEDICAL / SURGICAL HISTORY:  Active Ambulatory Problems     Diagnosis Date Noted    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    Senile nuclear sclerosis 05/23/2012    DM (diabetes mellitus), 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    HTN (hypertension) 07/18/2011    Malignant neoplasm of bladder (CMS-HCC) 03/23/2011    Tobacco use disorder 07/18/2011    Hernia, diaphragmatic 07/09/2013    Hyponatremia 08/03/2013    Hip arthritis mild 05/12/2014    Chronic renal disease, stage 2, mildly decreased glomerular filtration rate (GFR) between 60-89 mL/min/1.73 square meter 06/16/2014    Peripheral neuropathy 07/07/2014    Hypercholesterolemia refuses meds 04/28/2015    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    Schizoaffective disorder (CMS-HCC) 05/08/2015    Onychomycosis of multiple toenails with type 2 diabetes mellitus and peripheral neuropathy (CMS-HCC) 08/09/2016    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 11/07/2016    At maximum risk for fall 03/06/2017    Right hip pain 04/22/2017    Tinea versicolor 10/28/2017    Disorder of left rotator cuff 02/13/2018    Spinal stenosis of cervical region 02/13/2018    Left shoulder pain 02/20/2018    Articular cartilage disorder of shoulder region, left 02/20/2018    Tinnitus 01/13/2019    Headache 06/24/2020    Bradycardia 06/27/2020    Skin breakdown 06/30/2020    Influenza vaccination declined 09/15/2020    Chronic tension-type  headache, not intractable 09/15/2020    Osteoarthritis of cervical spine 09/15/2020    Arthralgia of both ankles 09/15/2020    Frequent PVCs 09/21/2020    Carotid arterial disease (CMS-HCC) < 40% bilaterally 2021 12/08/2020    Primary hypertension 09/27/2021    Nonrheumatic tricuspid valve regurgitation 09/27/2021    Pulmonary HTN (CMS-HCC) 04/11/2022    Pyelitis 05/10/2022    Age-related osteoporosis without current pathological fracture 09/14/2022    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) 10/18/2022     Resolved Ambulatory Problems     Diagnosis Date Noted    Depression, major, 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    Encounter for screening mammogram for high-risk patient 12/09/2012    Dizziness 07/08/2013    Nausea vomiting and diarrhea 08/03/2013    Thrombocytopenia (CMS-HCC) 08/11/2013    Colon cancer screening sigmoid '16 ok 03/15/2015    Type 2 diabetes mellitus (CMS-HCC) 05/05/2015    Severe recurrent depression with psychosis (CMS-HCC) 05/08/2015    Onychomycosis 08/09/2016     Past Medical History:   Diagnosis Date    Anxiety     Arthritis     Breast cyst     Cancer (CMS-HCC)     Depression, psychotic (CMS-HCC)     Diabetes mellitus (CMS-HCC)     Hernia     History of transfusion     Hypertension     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/08/2015         FAMILY HISTORY (Pertinent):    Family History   Problem Relation Age of Onset    Breast cancer Daughter 11    Diabetes Mother     Glaucoma Father     Colon cancer Neg Hx     Ovarian cancer Neg Hx Endometrial cancer Neg Hx     Anesthesia problems Neg Hx     Bleeding Disorder Neg Hx           SOCIAL HISTORY / SCREENINGS:  see screening and A/P above      PHYSICAL EXAM:  Vital Signs: There were no vitals taken for this visit.  Musculoskeletal: see above screening A/P  Psychiatric: see above screening A/P

## 2023-01-22 NOTE — Unmapped (Signed)
[facility] Administered Medication Refill Coordination Note      NAME:[name] DOB: 1941/03/26      Medication: fluphenazine decanoate    Day Supply: 28 days      SHIPPING      Next delivery from [facility] [phone]) to [facility] for Tammy Braun is scheduled for 02/01.    Clinic contact: [name]     Patient's next nurse visit for administration: 02/07.    We will follow up with clinic monthly for standard refill processing and delivery.      Sharicka Pogorzelski Samella Parr  Specialty Pharmacy Technician

## 2023-01-23 ENCOUNTER — Ambulatory Visit: Admit: 2023-01-23 | Discharge: 2023-01-23 | Payer: MEDICARE

## 2023-01-23 DIAGNOSIS — I10 Essential (primary) hypertension: Principal | ICD-10-CM

## 2023-01-23 MED ORDER — CANDESARTAN 16 MG TABLET
ORAL_TABLET | Freq: Every day | ORAL | 11 refills | 30 days | Status: CP
Start: 2023-01-23 — End: 2024-01-23

## 2023-01-23 NOTE — Unmapped (Signed)
DIVISION OF CARDIOLOGY   [employer] [place]        Date of Service: 01/23/2023    Assessment/Plan     1. PVCs -sinus rhythm with occasional PVC's    2. Hypertension -her blood pressure today is 130/79. We will continue current medications including Lasix and amlodipine 10mg . Continue candesartan 16 mg. Advised patient to monitor BP at home and maintain a log.     3. Tobacco use disorder - currently smoking 1 pack a day, increased from 5 cigarettes a day. Complete cessation encouraged.     4. Moderate/Severe Tricuspid Regurgitation - currently asymptomatic without evidence of volume overload.  We will continue to monitor.  - We will obtain an echo in 6 months    5. R Rotator cuff pain- follow-up with PCP.    Return to clinic:    Return in about 6 months (around 07/24/2023).      Subjective:   [id number], Tammy Breach, MD     Chief complaint: f/u of PVCs    History of Present Illness:  Tammy Braun is a 82 y.o. female with a history of bladder cancer s/p bladder removal w/ ileal-conduit with urostomy, emphysema, hypertension, tobacco abuse and hiatal hernia who presents for routine f/up.      Tammy Braun established care after hospitalization at [facility], [place]. She was admitted for significant hyponatremia in the setting of hydrochlorothiazide and poor p.o. intake. Was noted to have very frequent ectopy. Nuclear stress test negative for ischemia. Echo w/ normal LVSF, severe PH, mod-sev TR. Ziopatch results during hospital stay showed 19% PVCs that were symptomatic (she felt dizzy and lightheaded during that time). She had recurrence of episodes of lightheadedness/dizziness and EKG 09/21/20 showed recurrence of PVCs in bigeminy. Her daughter felt that it was due to patient taking Marlin Canary powders again for HA and smoking. She states that she took these away from her after hospitalization and thinks that made the PVCs initially improve.     Tammy Braun returns for follow-up today in clinic. She is doing well and denies any chest pain, shortness of breath, pnd/orthopnea or edema. No syncope or palpitations. She does mention some numbness in her right foot. She has had no hospitalizations since her last visit. She lives at home and continues to smoke.    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)    Schizoaffective disorder (CMS-HCC)    Onychomycosis of multiple toenails with type 2 diabetes mellitus and peripheral neuropathy (CMS-HCC)    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    At maximum risk for fall    Right hip pain    Tinea versicolor    Disorder of left rotator cuff    Spinal stenosis of cervical region    Left shoulder pain    Articular cartilage disorder of shoulder region, left    Tinnitus    Headache  Bradycardia    Skin breakdown    Influenza vaccination declined    Chronic tension-type headache, not intractable    Osteoarthritis of cervical spine    Arthralgia of both ankles    Frequent PVCs    Carotid arterial disease (CMS-HCC) < 40% bilaterally 2021    Primary hypertension    Nonrheumatic tricuspid valve regurgitation    Pulmonary HTN (CMS-HCC)    Pyelitis    Age-related osteoporosis without current pathological fracture    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)       Medications:  Current Outpatient Medications   Medication Sig Dispense Refill    acetaminophen (TYLENOL EXTRA STRENGTH) 500 MG tablet Take 2 tablets (1,000 mg total) by mouth every six (6) hours as needed for pain. 100 tablet 2    albuterol HFA 90 mcg/actuation inhaler Inhale 2 puffs every four (4) hours as needed for wheezing. 18 g 11    blood sugar diagnostic (ACCU-CHEK AVIVA PLUS TEST STRP) Strp by Other route daily at 10am. 50 strip 5    blood-glucose meter kit Use to check blood sugar four times daily or as directed by MD. Dx: Diabetes, E08.00 *DISPENSE ACCU-CHEK AVIVA PLUS BRAND* 1 each 0    cyclobenzaprine (FLEXERIL) 5 MG tablet Take 1 tablet (5 mg total) by mouth two (2) times a day as needed for muscle spasms. 60 tablet 5    fluPHENAZine decanoate (PROLIXIN) 25 mg/mL injection Inject 2 mL (50 mg total) into the muscle every thirty (30) days. 5 mL 11    furosemide (LASIX) 20 MG tablet TAKE 2 TABLETS BY MOUTH DAILY FOR 5 DAYS, THEN 1 TABLET DAILY 35 tablet 0    gabapentin (NEURONTIN) 100 MG capsule Take 1 capsule (100 mg total) by mouth nightly. 90 capsule 3    inhalational spacing device (E-Z SPACER) Spcr 1 each by Miscellaneous route every four (4) hours as needed. 1 each 0    lancets (ACCU-CHEK SOFTCLIX LANCETS) Misc 1 each by Miscellaneous route daily at 10am. 50 each 5    MEDICAL SUPPLY ITEM Entreal formula nutritionally supplement complete caloric density 3 application 3    senna (SENOKOT) 8.6 mg tablet Take 2 tablets by mouth nightly as needed for constipation. 60 tablet 0    tiotropium (SPIRIVA WITH HANDIHALER) 18 mcg inhalation capsule Place 1 capsule (18 mcg total) into inhaler and inhale daily as needed. 31 capsule 5    trolamine salicylate (ASPERCREME TOP) Apply 1 Application topically as needed (pain).      candesartan (ATACAND) 16 MG tablet Take 1 tablet (16 mg total) by mouth daily. 30 tablet 11     Current Facility-Administered Medications   Medication Dose Route Frequency Provider Last Rate Last Admin    fluPHENAZine decanoate (PROLIXIN) injection 50 mg  50 mg Intramuscular Q30 Days Lovett Calender, MD   50 mg at 01/07/23 1046 Allergies  Allergies   Allergen Reactions    Lisinopril Anaphylaxis and Swelling    Losartan Dizziness    Hctz [Hydrochlorothiazide]      SIADH       Social History:   Social History     Tobacco Use    Smoking status: Every Day     Current packs/day: 0.75     Average packs/day: 0.8 packs/day for 57.0 years (42.8 ttl pk-yrs)     Types: Cigarettes    Smokeless tobacco: Never    Tobacco comments:     15cpd   Vaping Use  Vaping Use: Never used   Substance Use Topics    Alcohol use: No     Alcohol/week: 0.0 standard drinks of alcohol    Drug use: No       Family History:  Family History   Problem Relation Age of Onset    Breast cancer Daughter 87    Diabetes Mother     Glaucoma Father     Colon cancer Neg Hx     Ovarian cancer Neg Hx     Endometrial cancer Neg Hx     Anesthesia problems Neg Hx     Bleeding Disorder Neg Hx        ROS- 12 system review is negative other than what is specified in the History of Present Illness.      Objective:   Physical Exam  Vitals:    01/23/23 1050   BP: 130/79   BP Site: L Arm   BP Position: Sitting   BP Cuff Size: Large   Pulse: 71   SpO2: 96%   Weight: 60.8 kg (134 lb)   Height: 160 cm (5' 3)       Wt Readings from Last 3 Encounters:   01/23/23 60.8 kg (134 lb)   01/16/23 59.3 kg (130 lb 11.2 oz)   01/07/23 59.5 kg (131 lb 3.2 oz)     General-  Patient is chronically ill-appearing in no acute distress  Neurologic- Alert and oriented X3.  Cranial nerve II-XII grossly intact.  HEENT-  Normocephalic atraumatic head.  No scleral icterus.  Wearing mask  Neck- Supple, no carotid bruis, no JVP  Lungs- clear to auscultation, no wheezes, rhonchi, or rhales.  Heart-  Bradycardic with HR 40, regular rhythm with occasional PVC's,  normal S1S2, short systolic murmur  Abdomen- Soft, nontender, no organomegally.  Extremities-  No clubbing or cyanosis. No LEE  Pulses- 2+pulses in radial and dorsalis pedis bilaterally.  Psych- Normal mood, appropriate.      Laboratory data:      Component Value Date/Time    PROBNP 338.0 07/26/2020 1604       Lab Results   Component Value Date    WBC 6.1 12/12/2022    HGB 12.6 12/12/2022    HCT 38.7 12/12/2022    PLT 248 12/12/2022       Lab Results   Component Value Date    NA 138 12/12/2022    K 4.8 12/12/2022    CL 105 12/12/2022    CO2 26.0 12/12/2022    BUN 20 12/12/2022    CREATININE 1.05 (H) 12/12/2022    GLU 106 12/12/2022    CALCIUM 9.4 12/12/2022    MG 2.1 05/13/2022    PHOS 3.2 05/13/2022       Lab Results   Component Value Date    TSH 1.898 06/22/2020    ALBUMIN 3.6 12/12/2022    ALT 16 12/12/2022    AST 17 12/12/2022    INR 1.1 08/02/2013       Lab Results   Component Value Date    LDL 88 11/02/2022    HDL 51 11/02/2022       Most Recent Imaging:     LHC  No prior     Echo  10/25/21    1. The left ventricle is normal in size with mildly increased wall  thickness.    2. The left ventricular systolic function is normal, LVEF is visually  estimated at > 55%.    3. The mitral valve  leaflets are mildly thickened with normal leaflet  mobility.    4. There is mild mitral valve regurgitation.    5. The aortic valve is probably trileaflet with mildly thickened leaflets  with mildly reduced excursion.    6. There is mild aortic regurgitation.    7. The left atrium is mildly dilated in size.    8. There is moderate pulmonary hypertension.    9. The right ventricle is mildly dilated in size, with normal systolic  function.    10. There is moderate tricuspid regurgitation.    11. The right atrium is mildly dilated  in size.  06/27/20    1. The left ventricle is normal in size with mildly increased wall thickness.    2. The left ventricular systolic function is normal, LVEF is visually estimated at 60-65%.    3. The right ventricle is mildly dilated in size, with normal systolic function.    4. There is grade II diastolic dysfunction (elevated filling pressure).    5. The left atrium is mildly dilated in size.    6. The right atrium is mildly dilated  in size.    7. There is mild aortic regurgitation.    8. There is moderate to severe tricuspid regurgitation.    9. There is severe pulmonary hypertension, estimated pulmonary artery systolic pressure is 71 mmHg.    10. The aorta at the ascending aorta is mildly dilated.     CT Chest  06/22/20  -- No evidence of intrathoracic mass as clinically questioned.  -- Dilated main pulmonary trunk measuring up to 3.9 cm which is nonspecific, however can be seen in the setting of pulmonary hypertension.  -- Grossly unchanged large hiatal hernia with associated adjacent atelectasis.  -- Additional chronic and incidental findings as described above.     NM Spect Mult / ETT 06/29/20  - No evidence for significant ischemia or scar is noted.  - Post stress: Global systolic function is normal. The ejection fraction was greater than 65%.  - Coronary and thoracic aorta calcifications as well as dilated main pulmonary trunk noted on CT, better characterized on recent chest CT scan dated 06/22/2020  - Large hiatal hernia noted, better characterized on recent CT abdomen 06/22/2020     Normal pharmacological stress ECG, nuclear images will be dictated separately.  No significant ST segment changes or arrhythmias were noted during stress  No significant chest pain symptoms reported     PVL Carotid Duplex 12/06/20  Right Carotid: There is evidence in the ICA of a less than 40% stenosis. Non-hemodynamically significant plaque noted in the CCA.  Left Carotid: There is evidence in the ICA of a less than 40% stenosis. Non-hemodynamically significant plaque noted in the CCA. Plaque noted in the ECA.  Vertebrals:  Both vertebral arteries were patent with antegrade flow.  Subclavians: Normal flow hemodynamics were seen in bilateral subclavian arteries.    ECG from 10/19/20 shows NSR, no PVCs      Dictated using Dragon software, please excuse typos.    Scribe Attestation:         This document serves as a record of the services and decisions performed by Molli Hazard A. Hessie Dibble, MD on 01/23/2023. It was created on his behalf by Fayette Pho, a trained medical scribe. The creation of this document is based on the provider's statements and observations that were conveyed to the medical scribe during the patient's encounter.     (The information in this document, created by  the medical scribe for me, accurately reflects the services I personally performed and the decisions made by me. I have reviewed and approved this document for accuracy.)    I personally spent face-to-face and non-face-to-face in the care of this patient, which includes all pre, intra, and post visit time on the date of service.      Artist Pais Hessie Dibble, MD, MPH  Associate Professor of Medicine  [place] [employer] [place] [facility] [employer]  [email or web address]   -    Office [phone]

## 2023-01-25 ENCOUNTER — Ambulatory Visit: Admit: 2023-01-25 | Payer: MEDICARE

## 2023-01-31 MED FILL — FLUPHENAZINE DECANOATE 25 MG/ML INJECTION SOLUTION: INTRAMUSCULAR | 28 days supply | Qty: 5 | Fill #3

## 2023-02-06 ENCOUNTER — Institutional Professional Consult (permissible substitution): Admit: 2023-02-06 | Discharge: 2023-02-07 | Payer: MEDICARE

## 2023-02-06 DIAGNOSIS — F259 Schizoaffective disorder, unspecified: Principal | ICD-10-CM

## 2023-02-06 MED ADMIN — fluPHENAZine decanoate (PROLIXIN) injection 50 mg: 50 mg | INTRAMUSCULAR | @ 17:00:00

## 2023-02-06 NOTE — Unmapped (Signed)
Administered LAI. See MAR for additional details.     Location -- Right Deltoid    NDC -- 42023-129-01      EXP -- 03/30/2024     LOT-- [id number]

## 2023-02-09 ENCOUNTER — Ambulatory Visit
Admit: 2023-02-09 | Discharge: 2023-02-10 | Disposition: A | Discharge status: Home / Self Care | Payer: MEDICARE | Attending: Emergency Medicine

## 2023-02-09 DIAGNOSIS — G63 Polyneuropathy in diseases classified elsewhere: Principal | ICD-10-CM

## 2023-02-10 MED ADMIN — acetaminophen (TYLENOL) tablet 1,000 mg: 1000 mg | ORAL | @ 01:00:00 | Stop: 2023-02-09

## 2023-02-10 NOTE — Unmapped (Signed)
[facility]  Emergency Department Provider Note     HPI     Tammy Braun is a very pleasant 82 y.o. female  has a past medical history of Anxiety, Arthritis, Breast cyst, Cancer (CMS-HCC), Depression, psychotic (CMS-HCC), Diabetes mellitus (CMS-HCC), Hernia, History of transfusion, Hypertension, and Pulmonary emphysema (CMS-HCC) (05/08/2015) presents emergency room today with complaint of bilateral upper extremity tingling and numbness that has been going on for several months however patient states that is gotten progressively worse prompting her ED visit today.  Patient also notes that she had a left shoulder replacement approximately 4 years ago and has been doing well with it.  She states that she was previously taking gabapentin however has not been taking it, but thinks that she has a refill at her pharmacy.  This prescription was handled through her primary care provider.  She denies any history of trauma or inciting mechanism for her paresthesias today, no fevers, chills, nausea, vomiting, chest pain.  At this time, patient is requesting Tylenol for pain and wants to be discharged.      MDM     BP 121/64  - Pulse 71  - Temp 36.5 ??C (97.7 ??F)  - Resp 18  - Wt 60.3 kg (133 lb)  - SpO2 93%  - BMI 23.56 kg/m??      Hemodynamically stable.  On exam, patient is well-appearing with her son at bedside, in no acute distress.  Patient has limited range of motion of both shoulders, approximately 100 degrees in flexion and abduction, slightly limited internal/external rotation, neurovascular intact distally in bilateral upper extremities.  Patient states that her limited range of motion has been chronic.  Given patient's history of diabetes and overall presentation today, her symptoms seem most consistent with peripheral neuropathy pertaining to her underlying condition.  She states the gabapentin has helped in the past.  After discussing her presentation today, she states that she can call her PCP and make an appointment on Tuesday to discuss further evaluation and treatment with gabapentin.  Patient was given 1000 mg Tylenol here in the ED.  Patient requesting discharge at this time.  Follow-up as well as return precautions were discussed in detail with the patient who understands and agrees with this plan.  Patient safe for discharge in stable condition at this time.    The case was discussed with the attending physician who is in agreement with the above assessment and plan.    - Any discussion of this patient's case/presentation between myself and consultants, admitting teams, or other team members has been documented above.  - Imaging and other studies, if performed, that were available during my care of the patient were independently reviewed and interpreted by me and considered in my medical decision making as documented above.     ED Clinical Impression     Final diagnoses:   Polyneuropathy associated with underlying disease (CMS-HCC) (Primary)        Physical Exam     Vitals:    02/09/23 1604 02/09/23 1608 02/09/23 1752 02/09/23 1900   BP:  107/65 123/79 121/64   Pulse: 64 81 72 71   Resp:  17 18 18    Temp:  36.6 ??C (97.9 ??F) 36.5 ??C (97.7 ??F)    TempSrc:  Oral     SpO2: 92% 98% 96% 93%   Weight:  60.3 kg (133 lb)          Constitutional: Appears stated age, overall well-appearing in no acute distress.  Eyes: Conjunctivae are normal. EOMI.   HEENT: Normocephalic and atraumatic. Mucous membranes are moist.   Neck: Full active range of motion  Cardiovascular: See heart rate listed above.  No murmurs appreciated.  Normal skin perfusion.   Respiratory: See respiratory rate listed above.  Lungs are clear to auscultation bilaterally.  Speaking easily in full sentences  Gastrointestinal: Soft, non-distended, non-tender.  No guarding.  Musculoskeletal: Limited range of motion of both shoulders, approximately 100 degrees in flexion and abduction, slightly limited internal/external rotation, neurovascular intact distally in bilateral upper extremities. No long bone deformities.   Neurologic: Normal speech and language. No gross focal neurologic deficits are appreciated.   Skin: Skin is warm, dry and intact.  Psychiatric: Mood and affect are normal.     Past History     PAST MEDICAL HISTORY/PAST SURGICAL HISTORY:   Past Medical History:   Diagnosis Date    Anxiety     Arthritis     Breast cyst     Cancer (CMS-HCC)     bladder    Depression, psychotic (CMS-HCC)     Diabetes mellitus (CMS-HCC)     in past    Hernia     History of transfusion     Hypertension     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/08/2015       Past Surgical History:   Procedure Laterality Date    ABDOMINAL SURGERY      BLADDER SURGERY      BREAST CYST EXCISION      CHEMOTHERAPY  2012    bladder    GALLBLADDER SURGERY      stone removal    ILEOSTOMY  2012    PR COLONOSCOPY FLX DX W/COLLJ SPEC WHEN PFRMD N/A 02/09/2015    Procedure: COLONOSCOPY, FLEXIBLE, PROXIMAL TO SPLENIC FLEXURE; DIAGNOSTIC, W/WO COLLECTION SPECIMEN BY BRUSH OR WASH;  Surgeon: Dewaine Conger, MD;  Location: [facility]EDURES [place];  Service: Gastroenterology    PR RECONSTR TOTAL SHOULDER IMPLANT Left 04/08/2018    Procedure: ARTHROPLASTY, GLENOHUMERAL JOINT; TOTAL SHOULDER(GLENOID & PROXIMAL HUMERAL REPLACEMENT(EG, TOTAL SHOULDER);  Surgeon: Tomasa Rand, MD;  [facility];  Service: Ortho Sports Medicine    PR SIGMOIDOSCOPY,BIOPSY N/A 03/11/2015    Procedure: SIGMOIDOSCOPY, FLEXIBLE; WITH BIOPSY, SINGLE OR MULTIPLE;  Surgeon: Wilburt Finlay, MD;  [facility];  Service: Gastroenterology    PR XCAPSL CTRC RMVL INSJ IO LENS PROSTH W/O ECP Left 06/05/2022    Procedure: EXTRACAPSULAR CATARACT REMOVAL W/INSERTION OF INTRAOCULAR LENS PROSTHESIS, MANUAL OR MECHANICAL TECHNIQUE WITHOUT ENDOSCOPIC CYCLOPHOTOCOAGULATION;  Surgeon: Garner Gavel, MD;  Location: [facility];  Service: Ophthalmology    PR XCAPSL CTRC RMVL INSJ IO LENS PROSTH W/O ECP Right 06/19/2022    Procedure: EXTRACAPSULAR CATARACT REMOVAL W/INSERTION OF INTRAOCULAR LENS PROSTHESIS, MANUAL OR MECHANICAL TECHNIQUE WITHOUT ENDOSCOPIC CYCLOPHOTOCOAGULATION;  Surgeon: Garner Gavel, MD;  Location: [facility] OR [facility];  Service: Ophthalmology       MEDICATIONS:     Current Facility-Administered Medications:     fluPHENAZine decanoate (PROLIXIN) injection 50 mg, 50 mg, Intramuscular, Q30 Days, Weber, Doralee Albino, MD, 50 mg at 02/06/23 1218    Current Outpatient Medications:     acetaminophen (TYLENOL EXTRA STRENGTH) 500 MG tablet, Take 2 tablets (1,000 mg total) by mouth every six (6) hours as needed for pain., Disp: 100 tablet, Rfl: 2    albuterol HFA 90 mcg/actuation inhaler, Inhale 2 puffs every four (4) hours as needed for wheezing.,  Disp: 18 g, Rfl: 11    blood sugar diagnostic (ACCU-CHEK AVIVA PLUS TEST STRP) Strp, by Other route daily at 10am., Disp: 50 strip, Rfl: 5    blood-glucose meter kit, Use to check blood sugar four times daily or as directed by MD. Dx: Diabetes, E08.00 *DISPENSE ACCU-CHEK AVIVA PLUS BRAND*, Disp: 1 each, Rfl: 0    candesartan (ATACAND) 16 MG tablet, Take 1 tablet (16 mg total) by mouth daily., Disp: 30 tablet, Rfl: 11    cyclobenzaprine (FLEXERIL) 5 MG tablet, Take 1 tablet (5 mg total) by mouth two (2) times a day as needed for muscle spasms., Disp: 60 tablet, Rfl: 5    fluPHENAZine decanoate (PROLIXIN) 25 mg/mL injection, Inject 2 mL (50 mg total) into the muscle every thirty (30) days., Disp: 5 mL, Rfl: 11    furosemide (LASIX) 20 MG tablet, TAKE 2 TABLETS BY MOUTH DAILY FOR 5 DAYS, THEN 1 TABLET DAILY, Disp: 35 tablet, Rfl: 0    gabapentin (NEURONTIN) 100 MG capsule, Take 1 capsule (100 mg total) by mouth nightly., Disp: 90 capsule, Rfl: 3    inhalational spacing device (E-Z SPACER) Spcr, 1 each by Miscellaneous route every four (4) hours as needed., Disp: 1 each, Rfl: 0    lancets (ACCU-CHEK SOFTCLIX LANCETS) Misc, 1 each by Miscellaneous route daily at 10am., Disp: 50 each, Rfl: 5    MEDICAL SUPPLY ITEM, Entreal formula nutritionally supplement complete caloric density, Disp: 3 application, Rfl: 3    senna (SENOKOT) 8.6 mg tablet, Take 2 tablets by mouth nightly as needed for constipation., Disp: 60 tablet, Rfl: 0    tiotropium (SPIRIVA WITH HANDIHALER) 18 mcg inhalation capsule, Place 1 capsule (18 mcg total) into inhaler and inhale daily as needed., Disp: 31 capsule, Rfl: 5    trolamine salicylate (ASPERCREME TOP), Apply 1 Application topically as needed (pain)., Disp: , Rfl:     ALLERGIES:   Lisinopril, Losartan, and Hctz [hydrochlorothiazide]    SOCIAL HISTORY:   Social History     Tobacco Use    Smoking status: Every Day     Current packs/day: 0.75     Average packs/day: 0.8 packs/day for 57.0 years (42.8 ttl pk-yrs)     Types: Cigarettes    Smokeless tobacco: Never    Tobacco comments:     15cpd   Substance Use Topics    Alcohol use: No     Alcohol/week: 0.0 standard drinks of alcohol       FAMILY HISTORY:  Family History   Problem Relation Age of Onset    Breast cancer Daughter 42    Diabetes Mother     Glaucoma Father     Colon cancer Neg Hx     Ovarian cancer Neg Hx     Endometrial cancer Neg Hx     Anesthesia problems Neg Hx     Bleeding Disorder Neg Hx          Radiology     No orders to display        Laboratory Data     Lab Results   Component Value Date    WBC 6.1 12/12/2022    HGB 12.6 12/12/2022    HCT 38.7 12/12/2022    PLT 248 12/12/2022       Lab Results   Component Value Date    NA 138 12/12/2022    K 4.8 12/12/2022    CL 105 12/12/2022    CO2 26.0 12/12/2022    BUN 20 12/12/2022  CREATININE 1.05 (H) 12/12/2022    GLU 106 12/12/2022    CALCIUM 9.4 12/12/2022    MG 2.1 05/13/2022    PHOS 3.2 05/13/2022       Lab Results   Component Value Date    BILITOT 0.3 12/12/2022    PROT 7.5 12/12/2022    ALBUMIN 3.6 12/12/2022    ALT 16 12/12/2022    AST 17 12/12/2022    ALKPHOS 92 12/12/2022    GGT 16 09/17/2011       Lab Results   Component Value Date    LABPROT 11.8 08/02/2013    INR 1.1 08/02/2013    APTT 28.0 08/02/2013       Kristopher Glee, DO  Emergency Medicine, PGY-1    Portions of this record have been created using Dragon dictation software. Dictation errors have been sought, but may not have been identified and corrected.         Corinna Lines, DO  Resident  02/09/23 [phone]

## 2023-02-10 NOTE — Unmapped (Signed)
Reporting BLE burning and numbness, and L arm numbness.

## 2023-02-10 NOTE — Unmapped (Signed)
Reports BLE paresthesia and L arm paresthesia that worsens at night. Reports onset was months ago.

## 2023-02-11 NOTE — Unmapped (Signed)
[facility] Population Health   Care Management Progress Note    Date: 02/11/2023  Outcome:  Phone outreach completed    Purpose of contact:         CM called pt regarding: Medicare AWV     #Medicare AWV  CM contacted pt to verify if she received and completed the HRA in preparation for her Medicare AWV appointment on 2/15 at 11 AM. [name] reports that she has the paperwork, but has not completed it yet. [name] requested that CM reschedule Ms. Bur's AWV as they will be out of town this week. CM rescheduled for 03/01 at 2 PM with Dr. Glorianne Manchester, MD. Pt is aware the HRA must be completed prior to the AWV.    Barriers to Care:  N/A    Provider/Care Partner(s) to follow up on:   N/A    Health Maintenance:  Health Maintenance Due   Topic Date Due    Medicare Annual Wellness Visit (AWV)  Never done    Hepatitis C Screen  Never done    DTaP/Tdap/Td Vaccines (1 - Tdap) Never done    Zoster Vaccines (1 of 2) Never done    Lung Cancer Screening Shared Decision Making  Never done    Pneumococcal Vaccine 65+ (2 of 2 - PCV) 06/04/2018    COVID-19 Vaccine (3 - Pfizer risk series) 10/19/2020    Urine Albumin/Creatinine Ratio  12/14/2020    Influenza Vaccine (1) Never done    Foot Exam  02/07/2023     Additional Information/Plan:  Patient provided my direct contact information and encouraged to contact me should additional needs arise.    Chart review performed prior to reaching out to patient.     Minutes spent providing outreach: 12    Baird Polinski  [facility]  [facility] Health Care Manager - [employer] Fellow  [email or web address]  [phone]

## 2023-02-20 NOTE — Unmapped (Addendum)
[facility] Administered Medication Refill Coordination Note      NAME:Sevilla Yashica Resko DOB: [birth year]      Medication: fluphenazine decanoate and Gean Birchwood    Day Supply: 28 days      SHIPPING      Next delivery from [facility] [phone]) to [facility] for Tonika Moberg is scheduled for 02/28.    Clinic contact: [name]     Patient's next nurse visit for administration: 03/01.    We will follow up with clinic monthly for standard refill processing and delivery.      Rahul Malinak Samella Parr  Specialty Pharmacy Technician

## 2023-02-26 ENCOUNTER — Ambulatory Visit: Admit: 2023-02-26 | Discharge: 2023-02-27 | Payer: MEDICARE

## 2023-02-26 MED ORDER — TRAMADOL 50 MG TABLET
ORAL_TABLET | 5 refills | 0 days | Status: CP
Start: 2023-02-26 — End: ?

## 2023-02-26 NOTE — Unmapped (Signed)
Chief Complaint   Patient presents with    Hypertension       ASSESSMENT/PLAN:    Problem List Items Addressed This Visit          Endocrine    DM (diabetes mellitus), type 2 with neurological complications (CMS-HCC)       Musculoskeletal and Integument    Articular cartilage disorder of shoulder region, left    Arthritis of neck       Genitourinary    Malignant neoplasm of bladder (CMS-HCC)       Other    Schizoaffective disorder (CMS-HCC)    Left shoulder pain - Primary       Tramadol- has used in past- 1/2 tab  May use tylenol bid 1000 mg  Updated problem list annotations  Database review and update  Med review and update  Lab review and update  Counseling provided and patient education    SUBJECTIVE:    Tammy Braun is a 82 y.o. 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 Bilateral renal cysts imilar to prior, small nonobstructing stone along the inferior collecting system measuring 0.4 cm.   Loopogram showed Reflux bilaterally. The bilateral renal collecting systems were moderately distended.         reports that she has been smoking cigarettes. She has a 42.8 pack-year smoking history. She has never used smokeless tobacco.    The following portions of the patient's history were reviewed and updated as appropriate: allergies, current medications, past family history, past medical history, past social history, past surgical history and problem list.    Review of systems for 10 organ systems conducted and no significant positives      OBJECTIVE:    Vital signs have been reviewed:   Vitals:    02/26/23 1050   BP: 132/82   Pulse: 79   Temp: 36.4 ??C (97.6 ??F)

## 2023-02-26 NOTE — Unmapped (Signed)
FOR ARM PAIN    Tramadol- has used in past- 1/2 tab    May use tylenol bid 1000 mg    Do not use [employer]

## 2023-02-27 MED FILL — FLUPHENAZINE DECANOATE 25 MG/ML INJECTION SOLUTION: INTRAMUSCULAR | 28 days supply | Qty: 5 | Fill #4

## 2023-02-28 NOTE — Unmapped (Signed)
PA submitted via covermymeds for Tramadol, currently pending with the insurance company.  Key#[id number]

## 2023-03-01 DIAGNOSIS — N182 Chronic kidney disease, stage 2 (mild): Principal | ICD-10-CM

## 2023-03-01 DIAGNOSIS — G8929 Other chronic pain: Principal | ICD-10-CM

## 2023-03-01 DIAGNOSIS — F259 Schizoaffective disorder, unspecified: Principal | ICD-10-CM

## 2023-03-01 DIAGNOSIS — E1149 Type 2 diabetes mellitus with other diabetic neurological complication: Principal | ICD-10-CM

## 2023-03-01 DIAGNOSIS — M25512 Pain in left shoulder: Principal | ICD-10-CM

## 2023-03-01 NOTE — Unmapped (Signed)
[facility] Population Health   Care Management Progress Note    Date: 03/01/2023  Outcome:  Phone outreach completed    Purpose of contact:         CM called pt regarding: Medicare AWV     #Medicare AWV  CM called pt's daughter Tammy Braun) to inform her that there was an error in Dr. Artemio Aly schedule and we will have to reschedule her AWV (previously scheduled for 03/01 at 2 PM). [name] states that she is not interested in re-scheduling at this time, but requested CM call back next week to re-schedule. CM is amenable to this plan and apologized for the inconvenience.    Barriers to Care:  N/A    Provider/Care Partner(s) to follow up on:   N/A    Health Maintenance:  Health Maintenance Due   Topic Date Due    Medicare Annual Wellness Visit (AWV)  Never done    Hepatitis C Screen  Never done    DTaP/Tdap/Td Vaccines (1 - Tdap) Never done    Zoster Vaccines (1 of 2) Never done    Lung Cancer Screening Shared Decision Making  Never done    Pneumococcal Vaccine 65+ (2 of 2 - PCV) 06/04/2018    COVID-19 Vaccine (3 - Pfizer risk series) 10/19/2020    Urine Albumin/Creatinine Ratio  12/14/2020    Influenza Vaccine (1) Never done    Foot Exam  02/07/2023       Additional Information/Plan:  Patient provided my direct contact information and encouraged to contact me should additional needs arise.    Chart review performed prior to reaching out to patient.     Minutes spent providing outreach: 15    Tammy Braun  [facility]  [facility] Health Care Manager - [employer] Fellow  [email or web address]  [phone]

## 2023-03-02 NOTE — Unmapped (Signed)
PA was approved for Tramadol currently approved through 02/26/23

## 2023-03-05 NOTE — Unmapped (Signed)
[facility] Population Health   Care Management Progress Note    Date: 03/05/2023  Outcome:  Phone outreach completed    Purpose of contact:         CM called pt regarding: Medicare AWV    #Medicare AWV  CM contacted patient's daughter Tammy Braun) to re-schedule Medicare Annual Wellness Visit per [facility] request. Pt's appointment scheduled with Dr. Glorianne Manchester on 03/01 at 2 PM needed to be cancelled due to an error in provider's schedule. When CM informed [name] about the reason for calling, [name] hung up the phone. CM will assist in re-scheduling upon return call.    Barriers to Care:  N/A    Provider/Care Partner(s) to follow up on:   N/A    Health Maintenance:  Health Maintenance Due   Topic Date Due    Medicare Annual Wellness Visit (AWV)  Never done    Hepatitis C Screen  Never done    DTaP/Tdap/Td Vaccines (1 - Tdap) Never done    Zoster Vaccines (1 of 2) Never done    Lung Cancer Screening Shared Decision Making  Never done    Pneumococcal Vaccine 65+ (2 of 2 - PCV) 06/04/2018    COVID-19 Vaccine (3 - Pfizer risk series) 10/19/2020    Urine Albumin/Creatinine Ratio  12/14/2020    Influenza Vaccine (1) Never done    Foot Exam  02/07/2023       Additional Information/Plan:  Patient provided my direct contact information and encouraged to contact me should additional needs arise.    Chart review performed prior to reaching out to patient.     Minutes spent providing outreach: 10    Brittian Renaldo  [facility]  [facility] Health Care Manager - [facility] Fellow  [email or web address]  [phone]

## 2023-03-08 ENCOUNTER — Institutional Professional Consult (permissible substitution): Admit: 2023-03-08 | Discharge: 2023-03-09 | Payer: MEDICARE

## 2023-03-08 MED ADMIN — fluPHENAZine decanoate (PROLIXIN) injection 50 mg: 50 mg | INTRAMUSCULAR | @ 16:00:00

## 2023-03-08 NOTE — Unmapped (Signed)
Administered long-acting injectable today,. See MAR for additional documentation. Jowell Bossi, CMA

## 2023-03-22 NOTE — Unmapped (Signed)
[facility] Administered Medication Refill Coordination Note      NAME:Tammy Braun DOB: 1941/06/21      Medication: fluphenazine decanoate    Day Supply: 28 days      SHIPPING      Next delivery from [facility] [phone]) to [facility] for Tammy Braun is scheduled for 03/22.    Clinic contact: [name]     Patient's next nurse visit for administration: 04/05.    We will follow up with clinic monthly for standard refill processing and delivery.      Randeep Biondolillo Samella Parr  Specialty Pharmacy Technician

## 2023-03-25 MED FILL — FLUPHENAZINE DECANOATE 25 MG/ML INJECTION SOLUTION: INTRAMUSCULAR | 28 days supply | Qty: 5 | Fill #5

## 2023-04-02 ENCOUNTER — Ambulatory Visit: Admit: 2023-04-02 | Discharge: 2023-04-03 | Payer: MEDICARE

## 2023-04-02 DIAGNOSIS — E1142 Type 2 diabetes mellitus with diabetic polyneuropathy: Principal | ICD-10-CM

## 2023-04-02 DIAGNOSIS — G603 Idiopathic progressive neuropathy: Principal | ICD-10-CM

## 2023-04-02 DIAGNOSIS — I10 Essential (primary) hypertension: Principal | ICD-10-CM

## 2023-04-02 DIAGNOSIS — J432 Centrilobular emphysema: Principal | ICD-10-CM

## 2023-04-02 DIAGNOSIS — C679 Malignant neoplasm of bladder, unspecified: Principal | ICD-10-CM

## 2023-04-02 DIAGNOSIS — B351 Tinea unguium: Principal | ICD-10-CM

## 2023-04-02 DIAGNOSIS — M4802 Spinal stenosis, cervical region: Principal | ICD-10-CM

## 2023-04-02 DIAGNOSIS — F17200 Nicotine dependence, unspecified, uncomplicated: Principal | ICD-10-CM

## 2023-04-02 DIAGNOSIS — G44229 Chronic tension-type headache, not intractable: Principal | ICD-10-CM

## 2023-04-02 DIAGNOSIS — M25571 Pain in right ankle and joints of right foot: Principal | ICD-10-CM

## 2023-04-02 DIAGNOSIS — E1149 Type 2 diabetes mellitus with other diabetic neurological complication: Principal | ICD-10-CM

## 2023-04-02 DIAGNOSIS — M158 Other polyosteoarthritis: Principal | ICD-10-CM

## 2023-04-02 DIAGNOSIS — M25572 Pain in left ankle and joints of left foot: Principal | ICD-10-CM

## 2023-04-02 DIAGNOSIS — E1169 Type 2 diabetes mellitus with other specified complication: Principal | ICD-10-CM

## 2023-04-02 DIAGNOSIS — M1710 Unilateral primary osteoarthritis, unspecified knee: Principal | ICD-10-CM

## 2023-04-02 LAB — HEMOGLOBIN A1C
ESTIMATED AVERAGE GLUCOSE: 137 mg/dL
HEMOGLOBIN A1C: 6.4 % — ABNORMAL HIGH (ref 4.8–5.6)

## 2023-04-02 MED ORDER — CANDESARTAN 16 MG TABLET
ORAL_TABLET | 11 refills | 0 days | Status: CP
Start: 2023-04-02 — End: ?

## 2023-04-02 NOTE — Unmapped (Signed)
Addended byGwenlyn Fudge, Maikayla Beggs on: 04/02/2023 10:57 AM     Modules accepted: Level of Service

## 2023-04-02 NOTE — Unmapped (Addendum)
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)       Cardiovascular and Mediastinum    HTN (hypertension)    Relevant Medications    candesartan (ATACAND) 16 MG tablet       Nervous and Auditory    Peripheral neuropathy       Musculoskeletal and Integument    Osteoarthrosis neck    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       Genitourinary    Malignant neoplasm of bladder (CMS-HCC)       Other    Tobacco use disorder (Chronic)    Spinal stenosis of cervical region    Chronic tension-type headache, not intractable    Arthralgia of both ankles       Increase arb 2 a day  Follow up with me in 8 weeks  Labs today  Smoking counseling 11 mins not interested  Updated problem list annotations  Database review and update  Med review and update  Lab review and update  Counseling provided and patient education    40 minutes with patient- 1/2 in face to face counseling on chronic health conditions and  pain and dm and worry    SUBJECTIVE:    Tammy Braun is a 82 y.o. 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 She has a 42.8 pack-year smoking history. She has never used smokeless tobacco.    The following portions of the patient's history were reviewed and updated as appropriate: allergies, current medications, past family history, past medical history, past social history, past surgical history and problem list.    Review of systems for 10 organ systems conducted and no significant positives      OBJECTIVE:    Vital signs have been reviewed:   Vitals:    04/02/23 1045   BP: 160/70   Pulse:    Temp:      Vss  Lungs cta  Cv rsr  Abd soft

## 2023-04-04 ENCOUNTER — Institutional Professional Consult (permissible substitution): Admit: 2023-04-04 | Discharge: 2023-04-05 | Payer: MEDICARE

## 2023-04-04 MED ADMIN — fluPHENAZine decanoate (PROLIXIN) injection 50 mg: 50 mg | INTRAMUSCULAR | @ 18:00:00

## 2023-04-04 NOTE — Unmapped (Signed)
Administered long-acting injectable today. See MAR for additional documentation. Veva Grimley, CMA

## 2023-04-07 MED ORDER — FUROSEMIDE 20 MG TABLET
ORAL_TABLET | 0 refills | 0 days | Status: CP
Start: 2023-04-07 — End: ?

## 2023-04-09 NOTE — Unmapped (Signed)
DIVISION OF CARDIOLOGY   [facility] [place]        Date of Service: 04/10/2023    Assessment/Plan     1. PVCs -sinus rhythm with occasional PVC's    2. Hypertension -her blood pressure today is 142/86. We will continue current medications including Lasix . Continue candesartan 32 mg. Advised patient to monitor BP at home and maintain a log. We will continue to monitor and if BP continues to be elevated, will consider re-addition of amlodipine.     3. Tobacco use disorder - currently smoking 1 pack a day, increased from 5 cigarettes a day. Complete cessation encouraged.     4. Moderate Tricuspid Regurgitation - currently asymptomatic without evidence of volume overload.  We will continue to monitor.  - Echo today shows moderate TR.    5. R Rotator cuff pain- follow-up with PCP.    Return to clinic:    Return in about 6 months (around 10/10/2023).      Subjective:   [name], Tammy Breach, MD     Chief complaint: f/u of PVCs    History of Present Illness:  Tammy Braun is a 82 y.o. female with a history of bladder cancer s/p bladder removal w/ ileal-conduit with urostomy, emphysema, hypertension, tobacco abuse and hiatal hernia who presents for routine f/up.      Tammy Braun established care after hospitalization at [facility]. She was admitted for significant hyponatremia in the setting of hydrochlorothiazide and poor p.o. intake. Was noted to have very frequent ectopy. Nuclear stress test negative for ischemia. Echo w/ normal LVSF, severe PH, mod-sev TR. Ziopatch results during hospital stay showed 19% PVCs that were symptomatic (she felt dizzy and lightheaded during that time). She had recurrence of episodes of lightheadedness/dizziness and EKG 09/21/20 showed recurrence of PVCs in bigeminy. Her daughter felt that it was due to patient taking Marlin Canary powders again for HA and smoking. She states that she took these away from her after hospitalization and thinks that made the PVCs initially improve.     Tammy Braun returns for follow-up today in clinic. She presented to the ED on 02/09/23 for bilateral upper extremity tingling and numbness associated with peripheral neuropathy.  Today, she mentions that she continues to be SOB and will use her inhaler when she coughs. She used her inhaler yesterday. She has not had any recent hospitalizations. She attributes to elevated BP in clinic to salt intake this morning. Overall, she endorses she is doing well, not mentioning any palpitations, chest pain, or syncopal episodes.    She lives at home and continues to smoke.    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)    Schizoaffective disorder (CMS-HCC)    Onychomycosis of multiple toenails with type 2 diabetes mellitus and peripheral neuropathy (CMS-HCC)    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    At maximum risk  for fall    Right hip pain    Tinea versicolor    Disorder of left rotator cuff    Spinal stenosis of cervical region    Left shoulder pain    Articular cartilage disorder of shoulder region, left    Tinnitus    Headache    Bradycardia    Skin breakdown    Influenza vaccination declined    Chronic tension-type headache, not intractable    Osteoarthritis of cervical spine    Arthralgia of both ankles    Frequent PVCs    Carotid arterial disease (CMS-HCC) < 40% bilaterally 2021    Primary hypertension    Nonrheumatic tricuspid valve regurgitation Pulmonary HTN (CMS-HCC)    Pyelitis    Age-related osteoporosis without current pathological fracture    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)    Arthritis of neck    Non-pressure chronic ulcer of other part of right foot limited to breakdown of skin (CMS-HCC)       Medications:  Current Outpatient Medications   Medication Sig Dispense Refill    acetaminophen (TYLENOL EXTRA STRENGTH) 500 MG tablet Take 2 tablets (1,000 mg total) by mouth every six (6) hours as needed for pain. 100 tablet 2    albuterol HFA 90 mcg/actuation inhaler Inhale 2 puffs every four (4) hours as needed for wheezing. 18 g 11    aspirin-acetaminophen-caffeine (EXCEDRIN MIGRAINE) 250-250-65 mg per tablet Take 1 tablet by mouth every six (6) hours as needed for pain.      candesartan (ATACAND) 16 MG tablet 1 po bid 60 tablet 11    cyclobenzaprine (FLEXERIL) 5 MG tablet Take 1 tablet (5 mg total) by mouth two (2) times a day as needed for muscle spasms. 60 tablet 5    fluPHENAZine decanoate (PROLIXIN) 25 mg/mL injection Inject 2 mL (50 mg total) into the muscle every thirty (30) days. 5 mL 11    furosemide (LASIX) 20 MG tablet TAKE 2 TABLETS BY MOUTH DAILY FOR 5 DAYS, THEN 1 TABLET DAILY 35 tablet 0    gabapentin (NEURONTIN) 100 MG capsule Take 1 capsule (100 mg total) by mouth nightly. 90 capsule 3    inhalational spacing device (E-Z SPACER) Spcr 1 each by Miscellaneous route every four (4) hours as needed. 1 each 0    MEDICAL SUPPLY ITEM Entreal formula nutritionally supplement complete caloric density 3 application 3    senna (SENOKOT) 8.6 mg tablet Take 2 tablets by mouth nightly as needed for constipation. 60 tablet 0    tiotropium (SPIRIVA WITH HANDIHALER) 18 mcg inhalation capsule Place 1 capsule (18 mcg total) into inhaler and inhale daily as needed. 31 capsule 5    traMADol (ULTRAM) 50 mg tablet 1/2 tablet by mouth up to four times a day prn pain 60 tablet 5    trolamine salicylate (ASPERCREME TOP) Apply 1 Application topically as needed (pain).      blood sugar diagnostic (ACCU-CHEK AVIVA PLUS TEST STRP) Strp by Other route daily at 10am. (Patient not taking: Reported on 04/10/2023) 50 strip 5    blood-glucose meter kit Use to check blood sugar four times daily or as directed by MD. Dx: Diabetes, E08.00 *DISPENSE ACCU-CHEK AVIVA PLUS BRAND* (Patient not taking: Reported on 04/10/2023) 1 each 0    lancets (ACCU-CHEK SOFTCLIX LANCETS) Misc 1 each by Miscellaneous route daily at 10am. (Patient not taking: Reported on 04/10/2023) 50 each 5     Current Facility-Administered Medications   Medication Dose Route  Frequency Provider Last Rate Last Admin    fluPHENAZine decanoate (PROLIXIN) injection 50 mg  50 mg Intramuscular Q30 Days Lovett Calender, MD   50 mg at 04/04/23 1422       Allergies  Allergies   Allergen Reactions    Lisinopril Anaphylaxis and Swelling    Losartan Dizziness    Hctz [Hydrochlorothiazide]      SIADH       Social History:   Social History     Tobacco Use    Smoking status: Every Day     Current packs/day: 1.00     Average packs/day: 1 pack/day for 65.0 years (65.0 ttl pk-yrs)     Types: Cigarettes     Start date: 04/09/1958    Smokeless tobacco: Never    Tobacco comments:     15cpd   Vaping Use    Vaping status: Never Used   Substance Use Topics    Alcohol use: No     Alcohol/week: 0.0 standard drinks of alcohol    Drug use: No       Family History:  Family History   Problem Relation Age of Onset    Breast cancer Daughter 71    Diabetes Mother     Glaucoma Father     Colon cancer Neg Hx     Ovarian cancer Neg Hx     Endometrial cancer Neg Hx     Anesthesia problems Neg Hx     Bleeding Disorder Neg Hx        ROS- 12 system review is negative other than what is specified in the History of Present Illness.      Objective:   Physical Exam  Vitals:    04/10/23 1128   BP: 142/86   BP Site: R Arm   BP Position: Sitting   BP Cuff Size: Medium   Pulse: 71   Resp: 18   SpO2: 94%   Weight: 60.3 kg (133 lb) Height: 170.2 cm (5' 7)         Wt Readings from Last 3 Encounters:   04/10/23 60.3 kg (133 lb)   04/02/23 60.2 kg (132 lb 12.8 oz)   02/26/23 59.2 kg (130 lb 9.6 oz)     General-  Patient is chronically ill-appearing in no acute distress  Neurologic- Alert and oriented X3.  Cranial nerve II-XII grossly intact.  HEENT-  Normocephalic atraumatic head.  No scleral icterus.    Neck- Supple, no carotid bruis, no JVP  Lungs- clear to auscultation, no wheezes, rhonchi, or rhales.  Heart-  Bradycardic with HR 40, regular rhythm with occasional PVC's,  no murmur  Abdomen- Soft, nontender, no organomegally.  Extremities-  No clubbing or cyanosis. No LEE  Pulses- 2+pulses in radial and dorsalis pedis bilaterally.  Psych- Normal mood, appropriate.      Laboratory data:      Component Value Date/Time    PROBNP 338.0 07/26/2020 1604       Lab Results   Component Value Date    WBC 6.1 12/12/2022    HGB 12.6 12/12/2022    HCT 38.7 12/12/2022    PLT 248 12/12/2022       Lab Results   Component Value Date    NA 138 12/12/2022    K 4.8 12/12/2022    CL 105 12/12/2022    CO2 26.0 12/12/2022    BUN 20 12/12/2022    CREATININE 1.05 (H) 12/12/2022    GLU 106 12/12/2022  CALCIUM 9.4 12/12/2022    MG 2.1 05/13/2022    PHOS 3.2 05/13/2022       Lab Results   Component Value Date    TSH 1.898 06/22/2020    ALBUMIN 3.6 12/12/2022    ALT 16 12/12/2022    AST 17 12/12/2022    INR 1.1 08/02/2013       Lab Results   Component Value Date    LDL 88 11/02/2022    HDL 51 11/02/2022       Most Recent Imaging:     LHC  No prior     Echo  10/25/21    1. The left ventricle is normal in size with mildly increased wall  thickness.    2. The left ventricular systolic function is normal, LVEF is visually  estimated at > 55%.    3. The mitral valve leaflets are mildly thickened with normal leaflet  mobility.    4. There is mild mitral valve regurgitation.    5. The aortic valve is probably trileaflet with mildly thickened leaflets  with mildly reduced excursion.    6. There is mild aortic regurgitation.    7. The left atrium is mildly dilated in size.    8. There is moderate pulmonary hypertension.    9. The right ventricle is mildly dilated in size, with normal systolic  function.    10. There is moderate tricuspid regurgitation.    11. The right atrium is mildly dilated  in size.  06/27/20    1. The left ventricle is normal in size with mildly increased wall thickness.    2. The left ventricular systolic function is normal, LVEF is visually estimated at 60-65%.    3. The right ventricle is mildly dilated in size, with normal systolic function.    4. There is grade II diastolic dysfunction (elevated filling pressure).    5. The left atrium is mildly dilated in size.    6. The right atrium is mildly dilated  in size.    7. There is mild aortic regurgitation.    8. There is moderate to severe tricuspid regurgitation.    9. There is severe pulmonary hypertension, estimated pulmonary artery systolic pressure is 71 mmHg.    10. The aorta at the ascending aorta is mildly dilated.     CT Chest  06/22/20  -- No evidence of intrathoracic mass as clinically questioned.  -- Dilated main pulmonary trunk measuring up to 3.9 cm which is nonspecific, however can be seen in the setting of pulmonary hypertension.  -- Grossly unchanged large hiatal hernia with associated adjacent atelectasis.  -- Additional chronic and incidental findings as described above.     NM Spect Mult / ETT 06/29/20  - No evidence for significant ischemia or scar is noted.  - Post stress: Global systolic function is normal. The ejection fraction was greater than 65%.  - Coronary and thoracic aorta calcifications as well as dilated main pulmonary trunk noted on CT, better characterized on recent chest CT scan dated 06/22/2020  - Large hiatal hernia noted, better characterized on recent CT abdomen 06/22/2020     Normal pharmacological stress ECG, nuclear images will be dictated separately.  No significant ST segment changes or arrhythmias were noted during stress  No significant chest pain symptoms reported     PVL Carotid Duplex 12/06/20  Right Carotid: There is evidence in the ICA of a less than 40% stenosis. Non-hemodynamically significant plaque noted in the CCA.  Left  Carotid: There is evidence in the ICA of a less than 40% stenosis. Non-hemodynamically significant plaque noted in the CCA. Plaque noted in the ECA.  Vertebrals:  Both vertebral arteries were patent with antegrade flow.  Subclavians: Normal flow hemodynamics were seen in bilateral subclavian arteries.    ECG from 10/19/20 shows NSR, no PVCs      Dictated using Dragon software, please excuse typos.    Scribe Attestation:         This document serves as a record of the services and decisions performed by Molli Hazard A. Hessie Dibble, MD on 04/10/2023. It was created on his behalf by Fayette Pho, a trained medical scribe. The creation of this document is based on the provider's statements and observations that were conveyed to the medical scribe during the patient's encounter.     (The information in this document, created by the medical scribe for me, accurately reflects the services I personally performed and the decisions made by me. I have reviewed and approved this document for accuracy.)    I personally spent face-to-face and non-face-to-face in the care of this patient, which includes all pre, intra, and post visit time on the date of service.      Artist Pais Hessie Dibble, MD, MPH  Associate Professor of Medicine  [facility] [place] [facility]  [email or web address]   -    Office [phone]

## 2023-04-09 NOTE — Unmapped (Signed)
[facility] Administered Medication Refill Coordination Note      NAME:Tammy Braun DOB: 03-06-1941      Medication: fluphenazine decanoate    Day Supply: 28 days      SHIPPING      Next delivery from [facility] [phone]) to [facility] for Tammy Braun is scheduled for 04/16.    Clinic contact: [name]     Patient's next nurse visit for administration: 04/19.    We will follow up with clinic monthly for standard refill processing and delivery.      Jaelon Gatley Samella Parr  Specialty Pharmacy Technician

## 2023-04-10 ENCOUNTER — Ambulatory Visit: Admit: 2023-04-10 | Discharge: 2023-04-11 | Payer: MEDICARE

## 2023-04-10 DIAGNOSIS — I361 Nonrheumatic tricuspid (valve) insufficiency: Principal | ICD-10-CM

## 2023-04-10 DIAGNOSIS — I493 Ventricular premature depolarization: Principal | ICD-10-CM

## 2023-04-15 NOTE — Unmapped (Signed)
Results Request     Patient Name: Tammy Braun   Caller: Self (Patient)  Contact Method: Telephone Call: Time- Any Time [phone]   Type of Results: Labs

## 2023-04-16 MED FILL — FLUPHENAZINE DECANOATE 25 MG/ML INJECTION SOLUTION: INTRAMUSCULAR | 28 days supply | Qty: 5 | Fill #6

## 2023-04-16 NOTE — Unmapped (Unsigned)
RHEUMATOLOGY INITIAL CONSULTATION NOTE    PCP: ??Vanetta Shawl, MD  Referring Provider: Vanetta Shawl    Accompanied by: *** who contributed to key portions of the history.    Tammy Braun is seen in consultation at the request of Vanetta Shawl for ANA of 1:80 and joint pain    History of Present Illness:     HPI: Tammy Braun is a 82 y.o. female with a history of CAD, DM, CKD-2 HTN, PAH, COPD, osteoporosis, and OA who is being seen in consultation at the request of Vanetta Shawl for evaluation of joint pain. Reviewed prior records including note from Dr. Gwenlyn Fudge from 09/04/2022 which indicates arthritis in multiple places, daughter requesting referral to specialist.     Today patient tells me ***     {abbyrheumhpi:[id number]}    ***      Past Medical History:   ***    Social History: ??  ?  Social History     Tobacco Use    Smoking status: Every Day     Current packs/day: 1.00     Average packs/day: 1 pack/day for 65.0 years (65.0 ttl pk-yrs)     Types: Cigarettes     Start date: 04/09/1958    Smokeless tobacco: Never    Tobacco comments:     15cpd   Vaping Use    Vaping status: Never Used   Substance Use Topics    Alcohol use: No     Alcohol/week: 0.0 standard drinks of alcohol    Drug use: No   ??  ??Occupation: ***  Tobacco: ***  Alcohol: ***    ??Family History: Reviewed history in Epic with the patient and includes the following  ??  No family history of autoimmune disease including no SLE, Sjogren's, rheumatoid arthritis, psoriasis, psoriatic arthritis, IBD, uveitis, multiple miscarriages, recurrent blood clots/PE's, or other autoimmune disorders except: ***    Objective    Physical Exam:  There were no vitals filed for this visit.  There is no height or weight on file to calculate BMI.   GENERAL: The patient is well appearing, in no acute distress. Ambulates around exam room and climbs on exam table without difficulty.  SKIN: No rash.   EYES: PERRL. Sclera anicteric conjunctiva non-injected.  ENT: mucus membranes moist without pharyngeal exudates or ulcers.  Neck: supple, no cervical lymphadenopathy.  Respiratory: Breathing non-labored, CTA bilaterally, no wheezing, crackles, or rhonchi.  CV: Heart rate regular, no murmurs.  GI: Abdomen soft, nontender, nondistended, no hepatosplenomegaly.  VASCULAR: warm and well perfused extremities, no c/c/e.  NEURO: CN 2-12 grossly intact.   PSYCH: No depression or anxiety. Cooperative. Alert and oriented.  MUSCULOSKELETAL:   Neck with painless, unlimited ROM.?  Bilateral shoulders, elbows, wrists, hands, fingers:  No deformity, erythema, warmth, swelling, effusion, tenderness, or limited ROM.?? Grip strength good. Able to curl all fingers. Prayer sign negative.   Spine nontender to palpation.   Hips without limited ROM.??  Bilateral knees, ankles, feet, toes: No deformity, erythema, warmth, swelling, effusion, tenderness, or limited ROM. MTP squeeze negative.    Test Results:    Reviewed results scanned in Media Tab and/or available in Epic and CareEverywhere including the following:     08/02/2020:  ANA 1:80  RF neg      Assessment/Plan:     Tammy Braun is a 83 y.o. female with a history of *** who is being seen for evaluation of ***      Labs  today.  (See below for details)    X-rays *** today.  Patient agreed to receive test results via MyChart.     Immunization Counseling:  {abbyimmunization:64723}    Covid Vaccine: ***       Follow-up: No follow-ups on file.    We discussed the above including diagnosis and recommendations, agreed on the above plan, and all questions were answered.    Danella Maiers, MD, MSCI  Assistant Professor of Medicine  Department of Medicine/Division of Rheumatology  [place] [facility]  [phone] clinic phone  [phone] clinic secure fax    We appreciate the opportunity to participate in the care of this patient.  I personally spent *** minutes face-to-face and non-face-to-face in the care of this patient, which includes all pre, intra, and post visit time on the date of service.  All documented time was specific to the E/M visit and does not include any procedures that may have been performed.       CC:   Vanetta Shawl, MD  Vanetta Shawl    There are no diagnoses linked to this encounter.

## 2023-04-16 NOTE — Unmapped (Signed)
Results Request     Patient Name: Tammy Braun   Caller: Self (Patient)  Contact Method: Telephone Call: Time- Any Time [phone]   Type of Results: Labs   Would like a call and lab results

## 2023-04-18 NOTE — Unmapped (Signed)
Please call pt with results

## 2023-04-19 ENCOUNTER — Ambulatory Visit: Admit: 2023-04-19 | Discharge: 2023-04-20 | Payer: MEDICARE

## 2023-04-19 NOTE — Unmapped (Signed)
Follow-up instructions:  -- Please continue taking your medications as prescribed for your mental health.   -- Do not make changes to your medications, including taking more or less than prescribed, unless under the supervision of your physician. Be aware that some medications may make you feel worse if abruptly stopped  -- Please refrain from using illicit substances, as these can affect your mood and could cause anxiety or other concerning symptoms.   -- Seek further medical care for any increase in symptoms or new symptoms such as thoughts of wanting to hurt yourself or hurt others.     Contact info:  Life-threatening emergencies: Call 911, the 988 suicide and crisis lifeline, or go to the nearest ER for medical or psychiatric attention.           Issues that need urgent attention but are not life threatening: Call the clinic outpatient front desk for assistance. [phone] for our [place] clinic located at [address]. [phone] for our [place] [facility].    Non-urgent routine concerns and questions: Send a message through [facility] or call our clinic front desk.    Refill requests: Check with your pharmacy to initiate refill requests.    Regarding appointments:  - If you need to cancel your appointment, we ask that you call your clinic at least 24 hours before your scheduled appointment. [phone] for our [place] clinic located at [address]. [phone] for our [place] [facility] clinic.  - If for any reason you arrive 15 minutes later than your scheduled appointment time, you may not be seen and your visit may be rescheduled.  - Please remember that we will not automatically reschedule missed appointments.  - If you miss two (2) appointments without letting us know in advance, you will likely be referred to a provider in your community.  - We will do our best to be on time. Sometimes an emergency will arise that might cause your clinician to be late. We will try to inform you of this when you check in for your appointment. If you wait more than 15 minutes past your appointment time without such notice, please speak with the front desk staff.    In the event of bad weather, the clinic staff will attempt to contact you, should your appointment need to be rescheduled. Additionally, you can call the Patient Weather Line [phone] for system-wide clinic status    For more information and reminders regarding clinic policies (these were provided when you were admitted to the clinic), please ask the front desk.

## 2023-04-19 NOTE — Unmapped (Unsigned)
[facility]  Psychiatry   Established Patient E&M Service - Outpatient       Assessment:    Tammy Braun presents for follow-up evaluation with her daughter. Today 04/19/23, patient endorses continued stability on regimen of Prolixin 50 mg LAI q30 days. Patient has tolerated this regimen of Prolixin well. She notes some infrequent auditory hallucinations (usually in the days leading up to her injection), but this is tolerable. No safety concerns. Main concern today is patient's loneliness and isolation. She desires more consistent care and contact. Therefore, will try to facilitate this by reaching out to our housing coordinator/social worker, providing patient with [facility], and sending message to PCP about ways to support. Otherwise, no acute safety concerns. RTC in 3 months. Resident transition discussed.     Identifying Information:  Tammy Braun is a 82 y.o. female with a history of schizoaffective disorder. First presented to [facility] September 2021. She has been stable on prolixin 50 mg q30 days for the past 30 years. Per PCP, patient has had worsening of depressive symptoms, though patient did not endorse. Reports auditory hallucinations and denies any CAH. Reports some anxiety related to impatience waiting on others, but finds this manageable. Patient is not interested in any new medications, but could consider increasing the frequency of prolixin administration if needed. March 2022, augmenting with mirtazapine for low mood and sleep. Could also consider augmenting with SRNI or wellbutrin for depression.     Risk Assessment:  A full suicide and violence risk assessment was performed as part of this patient's initial evaluation with [facility] outpatient psychiatry.  There is no new acute risk for suicide or violence at this time. The patient was educated about relevant modifiable risk factors including following recommendations for treatment of psychiatric illness and abstaining from substance abuse.   While future psychiatric events cannot be accurately predicted, the patient does not currently require acute inpatient psychiatric care and does not currently meet [facility] involuntary commitment criteria.      Plan:    Problem: Schizoaffective disorder  Status of problem: chronic and stable  Interventions:   - Continue Prolixin 50 mg IM q30 days; could consider increasing frequency of injections in the future  - Discontinued Cymbalta 20 mg daily as patient has not been taking consistently   - Pt is interested in therapy, on waitlist for therapy at [facility].     #High Risk Medication Monitoring   Lab drawn 10/2022: WNL  Lab drawn 04/2023: 6.4    Psychotherapy provided:  No billable psychotherapy service provided.    Patient has been given this writer's contact information as well as the [facility] urgent line number. The patient has been instructed to call 911 for emergencies.    Patient was seen and plan of care was discussed with the Attending MD,Dr. Dominic Pea, who agrees with the above statement and plan.    Subjective:    Interval History:   Patient seen in person with son-in-law.     She denies hearing the voices. Does not worry about people, denies paranoia. Goes to bed at 10:30pm - 3am. Says having conflict with daughter. Likes going to town, going to [employer], going shopping, watching stories.     Son in law says she needs more attention than she gets. Is alone a lot. Says she worries a lot, needs someone to talk to. Had someone to come in to help with things, but no longer. Living with daughter. Smoking 1/2 pack,  she declines wanting to cut down. No cognitive concerns.     Objective:    Mental Status Exam:  Appearance:    Appears stated age, Well nourished and Well developed   Motor:   No abnormal movements   Speech/Language:    Normal rate, volume, tone, fluency and Impaired articulation   Mood:   Good   Affect:   Calm, Cooperative and Euthymic   Thought process and Associations:   Logical, linear, clear, coherent, goal directed   Abnormal/psychotic thought content:     Denies SI, HI, self harm, delusions, obsessions, paranoid ideation, or ideas of reference   Perceptual disturbances:     Endorses very infrequent auditory hallucinations   Other:   Insight, judgment, and impulse control are fair     Visit was completed face to face.    Gerline Legacy, MD  PGY-2, Psychiatry  04/19/23 linear, clear, coherent, goal directed   Abnormal/psychotic thought content:     Denies SI, HI, self harm, delusions, obsessions, paranoid ideation, or ideas of reference   Perceptual disturbances:     Endorses very infrequent auditory hallucinations   Other:   Insight, judgment, and impulse control are fair       Visit was completed face to face.      Gerline Legacy, MD  PGY-2, Psychiatry  04/19/23

## 2023-04-22 NOTE — Unmapped (Signed)
Please call pt with results

## 2023-04-24 NOTE — Unmapped (Signed)
I communicated the message to patient,    Thanks

## 2023-04-26 NOTE — Unmapped (Signed)
I saw and evaluated the patient, participating in the key portions of the service.  I reviewed the resident???s note.  I agree with the resident???s findings and plan. Blake Vetrano F Salena Ortlieb, MD

## 2023-04-30 NOTE — Unmapped (Signed)
Tammy Braun has been contacted in regards to their refill of Fluphenazine . At this time, they have declined refill due to  per [name] pt still has 3 doses will follow-up in 3 months . Refill assessment call date has been updated per the patient's request.

## 2023-05-02 ENCOUNTER — Institutional Professional Consult (permissible substitution): Admit: 2023-05-02 | Discharge: 2023-05-03 | Payer: MEDICARE

## 2023-05-02 ENCOUNTER — Ambulatory Visit: Admit: 2023-05-02 | Discharge: 2023-05-03 | Payer: MEDICARE

## 2023-05-02 DIAGNOSIS — J432 Centrilobular emphysema: Principal | ICD-10-CM

## 2023-05-02 DIAGNOSIS — I272 Pulmonary hypertension, unspecified: Principal | ICD-10-CM

## 2023-05-02 DIAGNOSIS — F17200 Nicotine dependence, unspecified, uncomplicated: Principal | ICD-10-CM

## 2023-05-02 DIAGNOSIS — R059 Cough, unspecified type: Principal | ICD-10-CM

## 2023-05-02 DIAGNOSIS — I779 Disorder of arteries and arterioles, unspecified: Principal | ICD-10-CM

## 2023-05-02 DIAGNOSIS — L97511 Non-pressure chronic ulcer of other part of right foot limited to breakdown of skin: Principal | ICD-10-CM

## 2023-05-02 DIAGNOSIS — E1169 Type 2 diabetes mellitus with other specified complication: Principal | ICD-10-CM

## 2023-05-02 DIAGNOSIS — M158 Other polyosteoarthritis: Principal | ICD-10-CM

## 2023-05-02 DIAGNOSIS — J449 Chronic obstructive pulmonary disease, unspecified: Principal | ICD-10-CM

## 2023-05-02 DIAGNOSIS — E1142 Type 2 diabetes mellitus with diabetic polyneuropathy: Principal | ICD-10-CM

## 2023-05-02 DIAGNOSIS — B351 Tinea unguium: Principal | ICD-10-CM

## 2023-05-02 DIAGNOSIS — G603 Idiopathic progressive neuropathy: Principal | ICD-10-CM

## 2023-05-02 MED ADMIN — fluPHENAZine decanoate (PROLIXIN) injection 50 mg: 50 mg | INTRAMUSCULAR | @ 17:00:00

## 2023-05-02 NOTE — Unmapped (Signed)
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)       Cardiovascular and Mediastinum    Pulmonary HTN (CMS-HCC)       Nervous and Auditory    Peripheral neuropathy       Musculoskeletal and Integument    Osteoarthrosis neck       Other    Tobacco use disorder - Primary (Chronic)    Relevant Orders    CT chest without contrast     Other Visit Diagnoses       Cough, unspecified type        Relevant Orders    CT chest without contrast    Chronic obstructive pulmonary disease, unspecified COPD type (CMS-HCC)        Relevant Orders    CT chest without contrast            Stable today  Does not want xray or shots for knees  To see me again in 6 weeks  Counseled smok cess >10 mins  CT chest for cough- smoker  Updated problem list annotations  Database review and update  Med review and update  Lab review and update  Counseling provided and patient education    SUBJECTIVE:    Tammy Braun is a 82 y.o. female that presents to clinic today regarding the following issues:    Check up  Pain in knees  Right > left  Xray 2021  1. Progressive patellofemoral predominant osteoarthritis in the right knee. Multiple loose bodies.   2. Similar mild left knee osteoarthritis.     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 She started smoking about 65 years ago. She has a 65.1 pack-year smoking history. She has never used smokeless tobacco.    The following portions of the patient's history were reviewed and updated as appropriate: allergies, current medications, past family history, past medical history, past social history, past surgical history and problem list.    Review of systems for 10 organ systems conducted and no significant positives      OBJECTIVE:    Vital signs have been reviewed:   Vitals:    05/02/23 1334   BP: 155/89   Pulse: 74   Temp:      Vss  Heent neg  Lungs raspy no wheezes  Cv rsr  Ext neg

## 2023-05-02 NOTE — Unmapped (Signed)
Prolixin dec 50 mg administered via intramuscular injection to patient's right deltoid. Patient tolerated injection well. Next injection scheduled for 4 weeks.

## 2023-05-07 NOTE — Unmapped (Signed)
Case Management Progress Note  [facility]       Start Time: 1435      End Time: 1438    Duration: 3 minutes              Date of Service:  05/07/2023      Service: Case Management - phone    Purpose of contact:        Pt called to have CM speak to her daughter who provides care for her.     Additional Information/Plan:  [name] stated that she is in the process of getting an in home aide for Pt. She does not think she will need any help with this task from CM and follow up from CM will not be necessary. CM encouraged Pt and her daughter to reach out to her provider at [facility] with any future needs. CM will close case.     Roney Mans, MSW, LCSW  Case Manager - [facility]   [phone]

## 2023-05-07 NOTE — Unmapped (Signed)
Case Management Progress Note  [facility]       Start Time: 1212      End Time: 1216    Duration: 4 minutes              Date of Service:  05/07/2023      Service: Case Management - phone    Purpose of contact:        To assist Pt with getting a new caretaker or living situation.     Additional Information/Plan:  Pt reported that she wants to remain living in her current residence but she would like an aide to come in to provide care. Pt stated that her daughter is currently providing care and that she agreed to get an aide in a conversation they had yesterday. Pt believes her daughter will be able to accomplish this. Pt asked CM to call back in 2 weeks to follow up.     Roney Mans, MSW, LCSW  Case Manager - [facility]   [phone]

## 2023-05-16 ENCOUNTER — Ambulatory Visit: Admit: 2023-05-16 | Discharge: 2023-05-17 | Payer: MEDICARE

## 2023-05-16 NOTE — Unmapped (Signed)
Chief Complaint   Patient presents with    Follow-up     Right ankle pain (no injruy)       ASSESSMENT/PLAN:    Problem List Items Addressed This Visit          Musculoskeletal and Integument    Osteoarthrosis neck    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 - Primary       Genitourinary    Malignant neoplasm of bladder (CMS-HCC)    Chronic renal disease, stage 2, mildly decreased glomerular filtration rate (GFR) between 60-89 mL/min/1.73 square meter       Other    Tobacco use disorder (Chronic)    Schizoaffective disorder (CMS-HCC)    Spinal stenosis of cervical region    Arthralgia of both ankles       Large joint injection (shoulder/knees) - 4 ml total volume injected     CPT 20610  Kenalog J3301, quantity 4 (40mg ) - 1ml of 40mg /ml strength  Dexamethasone J1100, quantity 4 (4mg ) - 1 ml of 4mg /ml strength  bupivacaine 0.5% 6 ml   Procedure: R knee injection  Indication: R knee pain likely secondary to osteoarthritis    Consent   After discussing the various treatment options for the condition, It was agreed that a corticosteroid injection would be the next step in treatment. The nature of and the indications for a corticosteroid and / or local anaesthetic injection were reviewed in detail with the patient today. The inherent risks of injection including infection, bleeding, allergic reaction, increased pain, incomplete relief or temporary relief of symptoms, alterations of blood glucose levels requiring careful monitoring and treatment as indicated, tendon, ligament or articular cartilage rupture or degeneration, nerve injury, skin depigmentation, and/or fatty atrophy were discussed.   Procedure   After the risks and benefits of the procedure were explained,verbal consent was given, and a procedural time-out was performed. The knee joint was palpated and the correct anatomical landmarks were were used to identify the knee joint. The site for the injection from the medial approach was properly marked and prepped with Chlorhexadine solution. The injection site was anesthetized with ethyl chloride. The knee joint was injected with 40 milligrams of Kenalog, 4 milligrams of Dexamethasone, 6 cc of 0.5% Ropivicaine using a sterile technique and a 22 gauge 1.5 inch needle. During the injection, there was unrestricted flow and care was taken not to inject corticosteroid into the skin or subcutaneous tissues. There were no complications during the procedure.      Post procedure a sterile band-aide was applied. Post-injection instructions were given regarding post-procedure care, when to follow up in clinic and what to expect from the procedure. The patient tolerated the injection well and was discharged without complication.    Injection helped a great deal  Not int smoking cessation  To see me again next week for left knee injection    Updated problem list annotations  Database review and update  Med review and update  Lab review and update  Counseling provided and patient education    SUBJECTIVE:    Tammy Braun is a 82 y.o. 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 She started smoking about 65 years ago. She has a 65.1 pack-year smoking history. She has never used smokeless tobacco.    The following portions of the patient's history were reviewed and updated as appropriate: allergies, current medications, past family history, past medical history, past social history, past surgical history and problem list.    Review of systems for 10 organ systems conducted and no significant positives      OBJECTIVE:    Vital signs have been reviewed:   Vitals:    05/16/23 1354   BP: 130/60   Pulse:    Temp:      Vss  No effusions felt in knees or ankles  Strong odor secondhand smoke  Buts fell of shoes when she removed her slippers

## 2023-05-21 NOTE — Unmapped (Signed)
Caller asked to come in tomorrow for her LAI. 19 days after the last one given on 05/02/2023. CAM order is for every 30 days.      fluPHENAZine decanoate (PROLIXIN) 25 mg/mL injectionSig: Inject 2 mL (50 mg total) into the muscle every thirty (30) days      Caller was notified It was too early and her next scheduled LAI is scheduled for 05/30/2023 (28 days after the previous LAI  as 30 days falls on the weekend)

## 2023-05-22 ENCOUNTER — Ambulatory Visit: Admit: 2023-05-22 | Payer: MEDICARE

## 2023-05-24 NOTE — Unmapped (Signed)
Forms requesting your signature placed in box

## 2023-05-28 MED ORDER — FUROSEMIDE 20 MG TABLET
ORAL_TABLET | 0 refills | 0 days | Status: CP
Start: 2023-05-28 — End: ?

## 2023-05-28 NOTE — Unmapped (Signed)
Medication Request     Patient Name: Tammy Braun   Caller: Self (Patient)  Have you contacted your pharmacy? yes      Last Visit: 05/16/2023       Medication Name: Water Pill  Dosage:N/A  Route: Oral (PO)  Frequency:  N/A  Day Supply Requested: 30  Pharmacy (Name & Address): [facility]  [address] [place] [address]   Pharmacy Phone Number: [phone]     Patient called requesting to have Dr. Gwenlyn Fudge nurse call her, advised patient I could help and patient informed that she needs her water pill, patient could not tell me the name of the medication and advised that Dr. Everardo Pacific nurse would know what it is. Patient is requesting a call back from RN as well.

## 2023-05-28 NOTE — Unmapped (Signed)
Pt called back. °

## 2023-05-28 NOTE — Unmapped (Addendum)
Called the pt and unable to LVM. Furosemide was sent.

## 2023-05-29 NOTE — Unmapped (Signed)
Tammy Braun, daughter of Apryll contacted the clinic regarding the following:    - prescription for ostomy supplies.     Please contact [name] at [phone].    Thank you,    Trellis Paganini  Urology Services  [phone]

## 2023-05-29 NOTE — Unmapped (Signed)
Returned call to [facility] instructed her to have [facility] fax [place] the orders for [place] to sign and sent it back Rhunette Croft, RN

## 2023-05-30 ENCOUNTER — Institutional Professional Consult (permissible substitution): Admit: 2023-05-30 | Discharge: 2023-05-31 | Payer: MEDICARE

## 2023-05-30 MED ADMIN — fluPHENAZine decanoate (PROLIXIN) injection 50 mg: 50 mg | INTRAMUSCULAR | @ 16:00:00

## 2023-05-30 NOTE — Unmapped (Signed)
Administered long-acting injectable today,. See MAR for additional documentation. Yarima Penman, CMA

## 2023-06-06 ENCOUNTER — Ambulatory Visit: Admit: 2023-06-06 | Discharge: 2023-06-07 | Payer: MEDICARE

## 2023-06-12 DIAGNOSIS — E1142 Type 2 diabetes mellitus with diabetic polyneuropathy: Principal | ICD-10-CM

## 2023-06-12 MED ORDER — GABAPENTIN 100 MG CAPSULE
ORAL_CAPSULE | 3 refills | 0 days
Start: 2023-06-12 — End: ?

## 2023-06-13 ENCOUNTER — Ambulatory Visit: Admit: 2023-06-13 | Discharge: 2023-06-14 | Payer: MEDICARE

## 2023-06-13 DIAGNOSIS — J40 Bronchitis, not specified as acute or chronic: Principal | ICD-10-CM

## 2023-06-13 DIAGNOSIS — I779 Disorder of arteries and arterioles, unspecified: Principal | ICD-10-CM

## 2023-06-13 DIAGNOSIS — G603 Idiopathic progressive neuropathy: Principal | ICD-10-CM

## 2023-06-13 DIAGNOSIS — I272 Pulmonary hypertension, unspecified: Principal | ICD-10-CM

## 2023-06-13 DIAGNOSIS — J432 Centrilobular emphysema: Principal | ICD-10-CM

## 2023-06-13 DIAGNOSIS — E1142 Type 2 diabetes mellitus with diabetic polyneuropathy: Principal | ICD-10-CM

## 2023-06-13 DIAGNOSIS — F17200 Nicotine dependence, unspecified, uncomplicated: Principal | ICD-10-CM

## 2023-06-13 DIAGNOSIS — M47812 Spondylosis without myelopathy or radiculopathy, cervical region: Principal | ICD-10-CM

## 2023-06-13 MED ORDER — GABAPENTIN 100 MG CAPSULE
ORAL_CAPSULE | Freq: Three times a day (TID) | ORAL | 2 refills | 30.00000 days | Status: CP
Start: 2023-06-13 — End: 2023-06-13

## 2023-06-13 MED ORDER — DOXYCYCLINE HYCLATE 100 MG TABLET
ORAL_TABLET | 1 refills | 0 days | Status: CP
Start: 2023-06-13 — End: ?

## 2023-06-13 NOTE — Unmapped (Signed)
Reduce the candesartan to once a day

## 2023-06-13 NOTE — Unmapped (Signed)
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 2021    Pulmonary HTN (CMS-HCC)       Nervous and Auditory    Peripheral neuropathy       Musculoskeletal and Integument    Arthritis of neck       Other    Tobacco use disorder - Primary (Chronic)    Relevant Orders    Ambulatory referral to Pulmonology     Other Visit Diagnoses       Bronchitis        Relevant Medications    doxycycline (VIBRA-TABS) 100 MG tablet    Diabetic polyneuropathy associated with type 2 diabetes mellitus (CMS-HCC)        Relevant Medications    gabapentin (NEURONTIN) 100 MG capsule            Refill meds  Counseled smoking for 11 mins  Updated problem list annotations  Database review and update  Med review and update  Lab review and update  Counseling provided and patient education    40 minutes with patient- 1/2 in face to face counseling on chronic health conditions and lungs and heart    SUBJECTIVE:    Tammy Braun is a 82 y.o. 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 She started smoking about 65 years ago. She has a 65.2 pack-year smoking history. She has never used smokeless tobacco.    The following portions of the patient's history were reviewed and updated as appropriate: allergies, current medications, past family history, past medical history, past social history, past surgical history and problem list.    Review of systems for 10 organ systems conducted and no significant positives      OBJECTIVE:    Vital signs have been reviewed:   Vitals:    06/13/23 1026   BP: 92/62   Pulse: 70   Temp: 36.4 ??C (97.6 ??F)

## 2023-06-24 NOTE — Unmapped (Signed)
Forms requesting your signature placed in your box    Incontinence Order (4pgs)

## 2023-06-27 ENCOUNTER — Institutional Professional Consult (permissible substitution): Admit: 2023-06-27 | Discharge: 2023-06-28 | Payer: MEDICARE

## 2023-06-27 MED ADMIN — fluPHENAZine decanoate (PROLIXIN) injection 50 mg: 50 mg | INTRAMUSCULAR | @ 16:00:00

## 2023-06-27 NOTE — Unmapped (Signed)
Hi,      [name], pt's daughter contacted the clinic regarding the following:     - pt's daughter called wanting to know why the ostomy supply order had not been filled.      -After getting [facility] on the call it was made clear that the forms were missing an electronic stamp on the most recent form from 6/17, one done on the 18th and one done on the 12th all of these do not have the electronic stamp that has to be next to the signature for them to take it.       Best number to call them [phone].      Please contact [name] at [phone].     Thank you,     Trellis Paganini  Urology Services

## 2023-06-27 NOTE — Unmapped (Signed)
Informed daughter the order is been re faxed to [facility] Rhunette Croft, RN

## 2023-06-27 NOTE — Unmapped (Addendum)
Forms requesting your signature placed in your box    [facility]  3pgs

## 2023-06-27 NOTE — Unmapped (Signed)
Administered long-acting injectable today. See MAR for additional documentation. Sharyl Nimrod, CMA    Given by Gweneth Dimitri.

## 2023-06-27 NOTE — Unmapped (Addendum)
Hi,     [name], pt's daughter contacted the clinic regarding the following:    - pt's daughter called wanting to know why the ostomy supply order had not been filled.     -After getting [facility] on the call it was made clear that the forms were missing an electronic stamp on the most recent form from 6/17.      Please contact [name] at [phone].    Thank you,    Trellis Paganini  Urology Services  [phone]

## 2023-06-27 NOTE — Unmapped (Signed)
This patient called in about the forms that have not been fixed and was very upset on how this is being handled and asked to be transferred to Patient relations because this had been sent multiple times and she felt like no one was handling her moms case right. I got her over to patient relations. She was very thankful at the end I also explained to her how the process had changed and we can not just get up and get Dr Katrinka Blazing anymore. I also sent a other request for her as well marked urgent but also explained that as well to her.        Thanks   Delice Bison

## 2023-07-15 DIAGNOSIS — J441 Chronic obstructive pulmonary disease with (acute) exacerbation: Principal | ICD-10-CM

## 2023-07-22 ENCOUNTER — Ambulatory Visit
Admit: 2023-07-22 | Discharge: 2023-07-22 | Disposition: A | Discharge status: Home / Self Care | Payer: MEDICARE | Attending: Student in an Organized Health Care Education/Training Program

## 2023-07-22 DIAGNOSIS — W19XXXA Unspecified fall, initial encounter: Principal | ICD-10-CM

## 2023-07-22 DIAGNOSIS — R42 Dizziness and giddiness: Principal | ICD-10-CM

## 2023-07-22 DIAGNOSIS — R110 Nausea: Principal | ICD-10-CM

## 2023-07-22 NOTE — Unmapped (Signed)
[facility]  Emergency Department Provider Note      ED Clinical Impression      Final diagnoses:   Dizziness   Fall, initial encounter (Primary)   Nausea            Impression, Medical Decision Making, Progress Notes and Critical Care      Impression, Differential Diagnosis and Plan of Care    Moderate TBI, less likely CVA, basilar skull fracture, scaphoid fracture    Patient presents with dizziness and nausea following recent fall.  Patient was triaged to Team-P with initial blood pressure of 96/70.  Repeat vitals reassuring.  Patient denies current nausea while in the ED.  CT imaging of head face and neck ordered and returned without acute abnormalities including hemorrhage or osseous injury.  Suspect patient has symptoms of mild TBI.  Patient informed of rest from screen and ED return precautions.    Independent Interpretation of Studies    I have independently interpreted the following studies:  CT/MRI(s): See ED course    Discussion of Management with other Providers or Support Staff    I discussed the management of this patient with the:  None    Considerations Regarding Disposition/Escalation of Care and Critical Care    Indications for observation/admission (or consideration of observation/admission) and/or appropriateness for outpatient management: None  Patient/Family/Caregiver Discussions: Expectant management of symptoms and ED return precautions.  Diagnostic Tests Considered But Not Done: None  Prescription Drugs Provided or Considered But Not Given: None  Social Determinants of Health which significantly affected care: None    Additional Progress Notes    ED Course as of 07/22/23 1412   Mon Jul 22, 2023   1343 No overt osseous injury of the facial or C-spine.  Question of mild chronic left epidural hematoma and right occipital hypodensity on head CT.         Portions of this record have been created using Scientist, clinical (histocompatibility and immunogenetics). Dictation errors have been sought, but may not have been identified and corrected.    See chart and nursing documentation for additional details.    ____________________________________________         History        Reason for Visit  Dizziness      HPI   Tammy Braun is a 82 y.o. female PMH of HTN, bladder cancer, schizoaffective disorder DMT2 with peripheral neuropathy, carotid arterial disease, pulmonary HTN who presents with dizziness following fall.  Patient reports mechanical fall yesterday with face and hand contact.  She reports forehead pain, minimal nausea without emesis and mild abdominal discomfort that has since improved.  She has taken Tylenol for pain.  There is no new numbness or tingling in the upper or lower extremities.  She denies chest pain or shortness of breath surrounding the event.    Outside Historian(s)  (EMS, Significant Other, Family, Parent, Caregiver, Friend, Patent examiner, etc.)    Patient's daughter and caretaker provides additional history.    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       Patient Active Problem List   Diagnosis    Osteoarthrosis neck    Senile nuclear sclerosis    DM (diabetes mellitus), type 2 with neurological complications (CMS-HCC)    HTN (hypertension)    Tobacco use disorder    Hernia, diaphragmatic    Hyponatremia    Hip arthritis mild    Chronic renal disease, stage 2, mildly decreased glomerular filtration rate (GFR) between 60-89 mL/min/1.73 square meter    Peripheral neuropathy    Hypercholesterolemia refuses meds    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)    Schizoaffective disorder (CMS-HCC)    Onychomycosis of multiple toenails with type 2 diabetes mellitus and peripheral neuropathy (CMS-HCC)    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    At maximum risk for fall    Right hip pain    Tinea versicolor    Disorder of left rotator cuff    Spinal stenosis of cervical region    Left shoulder pain    Articular cartilage disorder of shoulder region, left    Tinnitus    Bradycardia    Skin breakdown    Influenza vaccination declined    Chronic tension-type headache, not intractable    Osteoarthritis of cervical spine    Arthralgia of both ankles    Frequent PVCs    Carotid arterial disease (CMS-HCC) < 40% bilaterally 2021    Primary hypertension    Nonrheumatic tricuspid valve regurgitation    Pulmonary HTN (CMS-HCC)    Pyelitis    Age-related osteoporosis without current pathological fracture    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)    Arthritis of neck    History of bladder cancer       Past Surgical History:   Procedure Laterality Date    ABDOMINAL SURGERY      BLADDER SURGERY      BREAST CYST EXCISION      CHEMOTHERAPY  2012    bladder    GALLBLADDER SURGERY      stone removal    ILEOSTOMY  2012    PR COLONOSCOPY FLX DX W/COLLJ SPEC WHEN PFRMD N/A 02/09/2015    Procedure: COLONOSCOPY, FLEXIBLE, PROXIMAL TO SPLENIC FLEXURE; DIAGNOSTIC, W/WO COLLECTION SPECIMEN BY BRUSH OR WASH;  Surgeon: Dewaine Conger, MD;  Location: [facility] [place];  Service: Gastroenterology    PR RECONSTR TOTAL SHOULDER IMPLANT Left 04/08/2018    Procedure: ARTHROPLASTY, GLENOHUMERAL JOINT; TOTAL SHOULDER(GLENOID & PROXIMAL HUMERAL REPLACEMENT(EG, TOTAL SHOULDER);  Surgeon: Tomasa Rand, MD;  Location: [facility];  Service: Ortho Sports Medicine    PR SIGMOIDOSCOPY,BIOPSY N/A 03/11/2015    Procedure: SIGMOIDOSCOPY, FLEXIBLE; WITH BIOPSY, SINGLE OR MULTIPLE;  Surgeon: Wilburt Finlay, MD;  [facility];  Service: Gastroenterology    PR XCAPSL CTRC RMVL INSJ IO LENS PROSTH W/O ECP Left 06/05/2022    Procedure: EXTRACAPSULAR CATARACT REMOVAL W/INSERTION OF INTRAOCULAR LENS PROSTHESIS, MANUAL OR MECHANICAL TECHNIQUE WITHOUT ENDOSCOPIC CYCLOPHOTOCOAGULATION;  Surgeon: Garner Gavel, MD;  Location: [facility] OR [facility];  Service: Ophthalmology    PR XCAPSL CTRC RMVL INSJ IO LENS PROSTH W/O ECP Right 06/19/2022    Procedure: EXTRACAPSULAR CATARACT REMOVAL W/INSERTION OF INTRAOCULAR LENS PROSTHESIS, MANUAL OR MECHANICAL TECHNIQUE WITHOUT ENDOSCOPIC CYCLOPHOTOCOAGULATION;  Surgeon: Garner Gavel, MD;  Location: [facility] OR [facility];  Service: Ophthalmology  Current Facility-Administered Medications:     fluPHENAZine decanoate (PROLIXIN) injection 50 mg, 50 mg, Intramuscular, Q30 Days, Weber, Doralee Albino, MD, 50 mg at 06/27/23 1138    Current Outpatient Medications:     acetaminophen (TYLENOL EXTRA STRENGTH) 500 MG tablet, Take 2 tablets (1,000 mg total) by mouth every six (6) hours as needed for pain., Disp: 100 tablet, Rfl: 2    albuterol HFA 90 mcg/actuation inhaler, Inhale 2 puffs every four (4) hours as needed for wheezing., Disp: 18 g, Rfl: 11    aspirin-acetaminophen-caffeine (EXCEDRIN MIGRAINE) 250-250-65 mg per tablet, Take 1 tablet by mouth every six (6) hours as needed for pain., Disp: , Rfl:     candesartan (ATACAND) 16 MG tablet, 1 po bid, Disp: 60 tablet, Rfl: 11    cyclobenzaprine (FLEXERIL) 5 MG tablet, Take 1 tablet (5 mg total) by mouth two (2) times a day as needed for muscle spasms., Disp: 60 tablet, Rfl: 5    doxycycline (VIBRA-TABS) 100 MG tablet, 1 po q day for 10 days, Disp: 10 tablet, Rfl: 1    fluPHENAZine decanoate (PROLIXIN) 25 mg/mL injection, Inject 2 mL (50 mg total) into the muscle every thirty (30) days., Disp: 5 mL, Rfl: 11    furosemide (LASIX) 20 MG tablet, TAKE 2 TABLETS BY MOUTH DAILY FOR 5 DAYS, THEN 1 TABLET DAILY, Disp: 90 tablet, Rfl: 0    gabapentin (NEURONTIN) 100 MG capsule, Take 1 capsule (100 mg total) by mouth Three (3) times a day., Disp: 90 capsule, Rfl: 2    inhalational spacing device (E-Z SPACER) Spcr, 1 each by Miscellaneous route every four (4) hours as needed., Disp: 1 each, Rfl: 0    MEDICAL SUPPLY ITEM, Entreal formula nutritionally supplement complete caloric density, Disp: 3 application, Rfl: 3    senna (SENOKOT) 8.6 mg tablet, Take 2 tablets by mouth nightly as needed for constipation., Disp: 60 tablet, Rfl: 0    tiotropium (SPIRIVA WITH HANDIHALER) 18 mcg inhalation capsule, Place 1 capsule (18 mcg total) into inhaler and inhale daily as needed., Disp: 31 capsule, Rfl: 5    traMADol (ULTRAM) 50 mg tablet, 1/2 tablet by mouth up to four times a day prn pain, Disp: 60 tablet, Rfl: 5    trolamine salicylate (ASPERCREME TOP), Apply 1 Application topically as needed (pain)., Disp: , Rfl:     Allergies  Lisinopril, Losartan, and Hctz [hydrochlorothiazide]    Family History   Problem Relation Age of Onset    Breast cancer Daughter 72    Diabetes Mother     Glaucoma Father     Colon cancer Neg Hx     Ovarian cancer Neg Hx     Endometrial cancer Neg Hx     Anesthesia problems Neg Hx     Bleeding Disorder Neg Hx        Social History  Social History     Tobacco Use    Smoking status: Every Day     Current packs/day: 1.00     Average packs/day: 1 pack/day for 65.3 years (65.3 ttl pk-yrs)     Types: Cigarettes     Start date: 04/09/1958    Smokeless tobacco: Never    Tobacco comments:     15cpd   Vaping Use    Vaping status: Never Used   Substance Use Topics    Alcohol use: No     Alcohol/week: 0.0 standard drinks of alcohol    Drug use: No  Physical Exam     This provider entered the patient's room: Yes:    If this provider did enter the patient room, the following was PPE worn: N95, eye protection and gloves     ED Triage Vitals   Enc Vitals Group      BP 07/22/23 1200 96/70      Heart Rate 07/22/23 1157 70      SpO2 Pulse --       Resp 07/22/23 1200 15      Temp 07/22/23 1200 36.6 ??C (97.9 ??F)      Temp Source 07/22/23 1200 Oral      SpO2 07/22/23 1157 100 %      Weight 07/22/23 1200 59 kg (130 lb)      Height --       Head Circumference --       Peak Flow --       Pain Score --       Pain Loc --       Pain Education --       Exclude from Growth Chart --        Constitutional: Alert and oriented. Well appearing and in no distress.  Eyes: Conjunctivae are normal.  ENT       Head: Normocephalic. Right frontal tenderness and hematoma, left forehead superficial abrasion.       Nose: Laceration over the nasal bridge.  No septal hematoma.  no congestion.       Mouth/Throat: Mucous membranes are moist.       Neck: no stridor.       Ears: No hemotympanum.  Hematological/Lymphatic/Immunilogical: No cervical lymphadenopathy.  Cardiovascular: Normal rate, regular rhythm. Normal and symmetric distal pulses are present in all extremities.  Respiratory: Normal respiratory effort. Rhonchi, productive cough.  Gastrointestinal: Soft and nontender. R abdominal ostomy. There is no CVA tenderness.  Genitourinary: Deferred  Musculoskeletal: Mildly decreased range of motion in all extremities.  No snuffbox tenderness.       Right lower leg: No tenderness or edema.       Left lower leg: No tenderness or edema.  Neurologic: Normal speech and language. No gross focal neurologic deficits are appreciated.  Skin: Skin is warm, dry and intact. No rash noted.  Psychiatric: Mood and affect are normal. Speech and behavior are normal.       Radiology     CT Cervical Spine Wo Contrast   Final Result   --No acute cervical spine abnormalities.      -- Multilevel cervical spondylosis and degenerative disc disease with at least mild to moderate multilevel spinal canal and moderate to severe multilevel osseous neural foraminal narrowing, better evaluated on prior MRI cervical spine.      CT Head Wo Contrast   Final Result   No acute intracranial abnormalities.      Chronic appearing right cerebellar hemisphere infarct, new in comparison to the prior exam from 2021.            CT Maxillofacial Wo Contrast   Final Result      No acute maxillofacial fracture.               Procedures     None               Ann Lions, [place]  07/22/23 [phone]

## 2023-07-22 NOTE — Unmapped (Signed)
Pt BIB family from UC for c/o dizziness and nausea r/t fall yesterday. Reports tripped and fell over the dog yesterday. Denies any anticoagulants or LOC. Sent for head CT.

## 2023-07-22 NOTE — Unmapped (Unsigned)
Assessment/Plan:     There are no diagnoses linked to this encounter.    Given exam, her age, dizziness and nausea, will send to ER for head CT.    Pt is using a wheelchair due to unsteadiness on feet.     No results found for this visit on 07/22/23.  No results found.        All labs and radiology findings were reviewed prior to patient's departure, if applicable. This was all relayed to patient and included on discharge paperwork.     Return precautions were expressed to patient. If anything should worsen Tammy Braun should go to ER immediately. Otherwise follow up with primary care physician. Diagnosis/possible diagnoses and treatment plan were gone over with patient and all questions were answered to patient's satisfaction. Pt happy and in agreement with plan.       Did today's visit save an ED/Direct Admission?  no  Transferred to ED Patient Disposition    Indication for Disposition: Requires a higher level of care    Chief Complaint:head injury following fall      Tammy Braun given instructions to proceed to ED via personal vehicle; Location: [facility] due to Needs imaging not available in UC      Report called to: ***        Subjective   Chief concern/reason for visit: Fall, Head Injury (Dizziness, denies any blurry vision or N&V), Leg Pain (Bilateral  ), and Gait Problem      HPI:  82 y.o. 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 She reports tripping over a dog and falling face first on a carpet inside. She was seen by EMS on scene. It is associated with dizziness, nausea, numbness in R hand, nose pain, and bilateral leg pain. Denies LOC and blurry vision. Has been rubbing alcohol on her forehead.     Patient is a current smoker  No pertinent past surgical history  No pertinent past family medical history     Review of Systems   Constitutional:  Negative for appetite change, chills, fatigue, fever and unexpected weight change.   HENT:  Negative for congestion, rhinorrhea and trouble swallowing.         Nose pain present   Eyes:  Negative for discharge and visual disturbance.   Cardiovascular:  Negative for chest pain.   Gastrointestinal:  Positive for nausea. Negative for vomiting.   Musculoskeletal:  Negative for arthralgias and myalgias.        Bilateral leg pain present   Skin:  Negative for color change, pallor, rash and wound.   Allergic/Immunologic: Negative for environmental allergies, food allergies and immunocompromised state.   Neurological:  Positive for dizziness and numbness (R hand). Negative for headaches.        No LOC   Hematological:  Negative for adenopathy. Does not bruise/bleed easily.   Psychiatric/Behavioral:  Negative for self-injury.         Objective   BP  148/81  - Pulse 70  - Temp 36.8 ??C (98.3 ??F) (Oral)  - Wt 59 kg (130 lb)  - BMI 23.03 kg/m??       Physical Exam  Vitals and nursing note reviewed.   Constitutional:       General: She is not in acute distress.     Appearance: She is well-developed. She is not diaphoretic.   HENT:      Head: Normocephalic and atraumatic.      Nose: No rhinorrhea.   Eyes:      Conjunctiva/sclera: Conjunctivae normal.   Neck:      Trachea: Phonation normal.   Cardiovascular:      Rate and Rhythm: Normal rate.      Pulses: Normal pulses.      Heart sounds: Normal heart sounds.   Pulmonary:      Effort: Pulmonary effort is normal. No accessory muscle usage or respiratory distress.      Breath sounds: Normal breath sounds.   Musculoskeletal:      Cervical back: Neck supple.   Skin:     General: Skin is warm and dry.      Coloration: Skin is not pale.      Findings: No abrasion, bruising, burn, ecchymosis, erythema, laceration, lesion, petechiae or rash. Rash is not urticarial.      Comments: Abrasion across bridge of nose with dried blood  Contusion on forehead   Neurological:      Mental Status: She is alert and oriented to person, place, and time.      GCS: GCS eye subscore is 4. GCS verbal subscore is 5. GCS motor subscore is 6.      Gait: Gait normal.      Comments: Pt is in a wheelchair due to unsteadiness on feet     Psychiatric:         Behavior: Behavior normal.         Thought Content: Thought content normal.         Judgment: Judgment normal.          This note was narrated with the use of dragon software. It was proof read by me, but may contain transcription errors. This does not reflect the level of care that was administered by our team today.     Benay Spice, PA-C            Scribe's Attestation: Benay Spice, PA obtained and performed the history, physical exam and medical decision making elements that were entered into the chart.  Signed by Rubin Payor, Scribe, on July 22, 2023 at 11:45 AM.    {*** NOTE TO PROVIDER: PLEASE ADD ATTESTATION NOTING YOU AGREE WITH SCRIBE DOCUMENTATION}

## 2023-07-23 ENCOUNTER — Ambulatory Visit: Admit: 2023-07-23 | Discharge: 2023-07-24 | Payer: MEDICARE

## 2023-07-23 DIAGNOSIS — M1710 Unilateral primary osteoarthritis, unspecified knee: Principal | ICD-10-CM

## 2023-07-23 DIAGNOSIS — I493 Ventricular premature depolarization: Principal | ICD-10-CM

## 2023-07-23 DIAGNOSIS — F17200 Nicotine dependence, unspecified, uncomplicated: Principal | ICD-10-CM

## 2023-07-23 DIAGNOSIS — F259 Schizoaffective disorder, unspecified: Principal | ICD-10-CM

## 2023-07-23 DIAGNOSIS — W19XXXS Unspecified fall, sequela: Principal | ICD-10-CM

## 2023-07-23 DIAGNOSIS — M47812 Spondylosis without myelopathy or radiculopathy, cervical region: Principal | ICD-10-CM

## 2023-07-23 DIAGNOSIS — I272 Pulmonary hypertension, unspecified: Principal | ICD-10-CM

## 2023-07-23 DIAGNOSIS — I10 Essential (primary) hypertension: Principal | ICD-10-CM

## 2023-07-23 DIAGNOSIS — M25572 Pain in left ankle and joints of left foot: Principal | ICD-10-CM

## 2023-07-23 DIAGNOSIS — I639 Cerebral infarction, unspecified: Principal | ICD-10-CM

## 2023-07-23 DIAGNOSIS — M25571 Pain in right ankle and joints of right foot: Principal | ICD-10-CM

## 2023-07-23 NOTE — Unmapped (Signed)
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       Musculoskeletal and Integument    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    Arthritis of neck       Other    Tobacco use disorder (Chronic)    Schizoaffective disorder (CMS-HCC)    Arthralgia of both ankles     Other Visit Diagnoses       Fall, sequela    -  Primary            Spoke with cardiology- they do NOT rec further workup of heart calcium UNLESS she has chest pain  Stable overall today  Not interested in smoking cessation  Follow with me in 4 weeks  Updated problem list annotations  Database review and update  Med review and update  Lab review and update  Counseling provided and patient education    40 minutes with patient- 1/2 in face to face counseling on chronic health conditions and  smoking and fall and pulmonary issues    SUBJECTIVE:    Tammy Braun is a 82 y.o. female that presents to clinic today regarding the following issues:    Here for check up    Larey Seat yesterday  Seen in ER  Hit head after tripped over dog  With dizziness and nausea   Had CT neck and CT head  CT head showed chronic appearing right cerebellar hemisphere infarct, new in comparison to the prior exam from 2021.    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 She started smoking about 65 years ago. She has a 65.3 pack-year smoking history. She has never used smokeless tobacco.    The following portions of the patient's history were reviewed and updated as appropriate: allergies, current medications, past family history, past medical history, past social history, past surgical history and problem list.    Review of systems for 10 organ systems conducted and no significant positives      OBJECTIVE:    Vital signs have been reviewed:   Vitals:    07/23/23 1117   BP: 140/82   Pulse: 60

## 2023-07-23 NOTE — Unmapped (Signed)
[facility] Administered Medication Refill Coordination Note      NAME:Tammy Braun DOB: 1941-06-10      Medication: fluphenazine decanoate    Day Supply: 28 days      SHIPPING      Next delivery from [facility] [phone]) to [facility] for Siarah Salyards is scheduled for 08/01.    Clinic contact:     Maryan Char, CMA       Patient's next nurse visit for administration: 08/23.    We will follow up with clinic monthly for standard refill processing and delivery.      Paulena Servais Samella Parr  Specialty Pharmacy Technician

## 2023-07-25 ENCOUNTER — Institutional Professional Consult (permissible substitution): Admit: 2023-07-25 | Discharge: 2023-07-26 | Payer: MEDICARE

## 2023-07-25 MED ADMIN — fluPHENAZine decanoate (PROLIXIN) injection 50 mg: 50 mg | INTRAMUSCULAR | @ 15:00:00 | NDC 00003056902

## 2023-07-25 NOTE — Unmapped (Signed)
Patient came in today an LAI only visit. Patients medication was patient supplied.  Prolixin dec 50 mg administered via intramuscular injection.    Location -- Right Deltoid    NDC --  42023-129-01    EXP--  03/30/2024    LOT--  [id number]    SN--  [id number]    Patient tolerated injection well. Next injection scheduled for 4 weeks.    Next Scheduled LAI:

## 2023-07-31 MED FILL — FLUPHENAZINE DECANOATE 25 MG/ML INJECTION SOLUTION: INTRAMUSCULAR | 28 days supply | Qty: 5 | Fill #7

## 2023-08-04 NOTE — Unmapped (Unsigned)
Pulmonary Clinic - Initial Visit    Referring Physician :  Vanetta Shawl  PCP:     Vanetta Shawl, MD    HISTORY:     History of Present Illness:  Tammy Braun is a 82 y.o. with the below medical issues who I am seeing for evaluation of COPD    HPI  Per chart review, Tammy Braun presents for follow up on COPD. She has a significant smoking history 65+ pack years and continues to actively smoke. She was referred by her PCP after a bout of bronchitis in June 2025 which was treated with doxycycline. She is not currently on any inhalers. Has no recent ED/UC trips beyond the one just described. No prednisone scripts.        Past Medical History: The medical and surgical history were personally reviewed and updated in the patient's electronic medical record. Pertinent positives are documented above.    #COPD  #tobacco use  #pulmonary hypertension   - seen by Dr Ala Dach 2021. Thought related to group 2 and 3, no specific PH intervention necessary. Moderate on both 2022 and 2024 TTEs. PASP 55 on 2024 TTE, TR velocity 3.5 cm. Diastolic dysfunction on TTE. RV with normal systolic function.    #CAD - stress test negative 2021  #cerebellar stroke  #schizoaffective disorder  #CKD  #HTN  #diaphragmatic hernia  #T2DM  #peripheral neuropathy  #OA of neck, hip, shoulder  #HLD      Social History  - Smoking  - Vaping:  - Chewing:  - Other ilicits:  - Alcohol use:  - Occupation:  - Exposures:   []  Medications - amiodarone, bleomycin, cyclophosphamide, methotrexate, nitrofurantoin, penicillamine, other chemotherapy  []  Asbestos  []  Beryllium, glass cutting, mining activities, silica (sandblasting), woodworking  []  Farm work or [employer] farming  []  Pet birds/raise birds or down bedding  []  Mold - mold damage at home or home ever flooded  []  Hot tub with standing water or sauna exposure/use  - Travel History:    FMH:   Home Medications: Medications were reviewed and updated in the patient's electronic medical record. Pertinent positives are documented above.  Allergies: Allergies were reviewed and updated in the patient's electronic medical record. Pertinent positives are documented above.  Review of Systems: A comprehensive review of systems was completed and negative except as noted in HPI.    PHYSICAL EXAM:     There were no vitals taken for this visit.    ***    LABORATORY and RADIOLOGY DATA:      I have personally reviewed all of the below data in the chart and pulled from extra-chart sources (CareEverywhere, Media tab)     Pulmonary Function Tests/Interpretation:    Date:  FVC (% Pred)  Pre FEV1 (%Pred)   Post FEV1 (%Pred)   FEV1/FVC  FEF25-75(% Pred)  DLCO (% Pred)     06/2020*  1.49  1.01                                              *did not meet ATS criteria for interpretation. Same for 07/2023.    CT chest 06/2023 - advanced empysema, diaphragmatic hernia R > L, LLL consolidation / sail sign     ASSESSMENT and PLAN     Problem List      Assessment      Plan  The patient was seen with Dr. Marland Kitchen and will return to clinic in *** or sooner if needed.     JO:ACZY Benedetto Goad, Vanetta Shawl, MD    Jessica Priest, MD, MBA  Pulmonary and Critical Care Fellow

## 2023-08-04 NOTE — Unmapped (Incomplete)
***    You were seen in clinic today by Dr. Harrison Mons - it was great to see you and thank you for letting me participate in your care.    Here are some things you should know about contacting the [facility]:  To contact your care team, you can either send a message via MyChart or contact the clinic at [phone].  My [facility] Chart is for non-urgent messages. This means you have a simple medical question that does not require an immediate response. Good examples of types of questions that we want to hear about:  - You need a refill.  - You are unable to fill a new prescription because it is too much money. Please let me know if this is the case.  - We discussed a new scan or test but it is not scheduled and you're not sure why.     If you need immediate attention, call 911. If you have an non-emergent but urgent issue that you feel needs a response the same day, you should contact your primary care provider or be evaluated at an Urgent Care clinic.    Responses may take up to 3 business days. Your message will be read by your provider or another medical team member who may respond on your provider's behalf. We will do our best to respond as quickly as possible, as your message is important to [place]. However, many of our providers have other duties (inpatient hospital work, Producer, television/film/video activities, teaching) that may make them not available for same day responses. If you have sent a MyChart message to the clinic and have not received a response 3 three business days, please call us at [phone] to speak to a nurse. Some questions cannot be answered through messages in My [facility] Chart. Depending on your question, your provider's office may ask you to schedule an appointment.    Information sent through My [facility] Chart will become part of your medical record.    Please be advised Epic now releases test results to MyChart as soon as they are available which means you will see may see your test results before I do.    For urgent lung issues after hours, you can call the hospital operator [phone]) and ask for the Pulmonary Fellow On Call. This doctor can provide some guidance and will send a message to your regular lung doctor the next morning.    I don't have a MyChart. Why should I get one?   - It's encrypted, so your information is secure  - It's a quick, easy way to contact the care team, manage appointments, see test results, and more!    How do I sign-up for MyChart?   - Download the MyChart app from the [employer] or [employer] and sign-up in the app  - Sign-up online at [email or web address]       Quitting smoking is the single most important thing you can do to improve your health today.    Your health care team of doctors and nurses understand how hard it is to quit smoking. The first step is to understand why it is important to YOU to stop smoking. Here are some things to think about:    1. It is never too late to quit, no matter how old you are or how long you have been smoking. We know from studies that even people who quit at the age of 58-60 can gain 4-5 more years of life than if they  kept on smoking.     2. If you are smoking one pack a day now, and you quit for a year, you save around $2000! Think about what you can do with that money- pay off bills, take a trip with your family or something else important to you.     3.  Your smoking harms the people around you- your spouse, your children, your friends. If you smoke with friends and family, your quitting can help them quit too. Don't just think about the benefits for yourself.    4. Quitting smoking will decrease the amount of time you spend in doctor's offices and the hospital. It will also improve your overall health.    5. When you stop smoking, so many things change- food tastes better, your clothes and  breath don't smell, you are less winded- think of all these benefits!    6. Yes- quitting smoking is hard- but it is the single most important thing you can do to improve your health. You owe it to yourself, your family and your doctors who care for you.     7. There are medicines that can help ease the craving. But the first step starts with you being ready to quit.     8. There is support to help you learn more about quitting!  -- www.Quitline[place].com and TotallyWiped.com.ee  -- MechanicalArm.dk, BecomeAnEx.org and www.smokingstopshere.com  -- www.Livehelp.cancer.gov provides live online chat support  -- There is a free quit line (1-800-QUIT-NOW) that can provide counseling and sometimes free patches/gum.     The [place] [facility] [place] [facility] can provide you with the information and tools you need to become tobacco free. A treatment specialist will work with you to make a plan that works for you and your lifestyle including dealing with withdrawal, using tobacco cessation medications, and strategies for changing behaviors that are linked to your tobacco use. We will follow-up with you for adjustment of the treatment plan and medications, as needed, and give you on-going support to stop smoking and prevent relapse.    Individual appointments with a tobacco treatment specialist at the [facility] are scheduled on Tuesday mornings and Wednesdays. In order to spend the majority of the first visit focusing on the treatment plan, we ask new patients to complete an Initial Assessment online before the first appointment.    To make an appointment today call [phone] or e-mail [email or web address]

## 2023-08-06 NOTE — Unmapped (Signed)
Complex Case Management  SUMMARY NOTE    Attempted to contact patient's daughter [name] today at Home number to introduce Complex Case Management services. No answer, unable to leave message; 1st attempt    Discuss at next visit: Introduction to Complex Case Management      Deliah Goody - Care Management Assistant  [facility]  [address], [address]  [place], [place] [address]  P: [phone] F: [phone]  [email or web address]

## 2023-08-12 ENCOUNTER — Ambulatory Visit
Admit: 2023-08-12 | Discharge: 2023-08-12 | Disposition: A | Discharge status: Home / Self Care | Payer: MEDICARE | Attending: Student in an Organized Health Care Education/Training Program

## 2023-08-12 DIAGNOSIS — M25512 Pain in left shoulder: Principal | ICD-10-CM

## 2023-08-12 DIAGNOSIS — G8929 Other chronic pain: Principal | ICD-10-CM

## 2023-08-12 MED ADMIN — traMADol (ULTRAM) tablet 25 mg: 25 mg | ORAL | @ 23:00:00 | Stop: 2023-08-12

## 2023-08-12 NOTE — Unmapped (Signed)
Complex Case Management       Pre-Assessment Note                  08/12/2023    Summary:    [facility] verified correct patient using two identifiers today for enrollment in Complex Case Management. Informed patient of Complex Case Management services. Patient has agreed to participate in the Complex Case Management program. [facility] contact information was provided to patient.     Upcoming Appointment(s):  Patient's next virtual complex case management appointment is at 08/14/23 at 2:00pm with Sherlyn Hay    Completed By: Caregiver      General Care Management - Patient Level    Assessment completed with: family (Comment: with patient's daughter [name])[KH1.1]  Patient lives with: Child/Children (Comment: [name] most of the time)[KH1.1]  Support system: children (Comment: her daughter [name] handles all support if needed;her other children will provide emotional support)[KH1.1]  Type of residence: private residence[KH1.1]  DME used at home: walker[KH1.1]  Transportation means: family (Comment: her daughter will drive FAO)[ZH0.8]  Does your health interfere with activities of daily living?: sleep, eating, self-care[KH1.1]  Exercise: yes[KH1.1]  Type of exercise: light exercise per daughter[KH1.1]  Follow special diet?: diabetic diet[KH1.1]  Interested in seeing dietician?: No (Comment: not at this time)[KH1.1]  Experiencing side effects from current medications: No (Comment: not at this time)[KH1.1]  Interested in seeing pharmacist?: No (Comment: not at this time)[KH1.1]  Difficulty keeping appointments: No (Comment: not at this time)[KH1.1]  Need assistance with community resources?: Yes (Comment: see below)[KH1.1]  Other significant issues impacting care?: Patient was approved for power wheelchair through [employer]) needs help with checking the status of the wheel chair dtr said its been a month since she last spoke with them.    Has a Case Manager at [facility] is named Walters; French Ana was working on making the bathroom ADA but they have not followed up with her recently.     Dtr wants financial reimbursement for driving her to all of her appointments      Dtr states patient enjoys going to the dr sometimes when needed and sometimes not when needed.   Would like possible resources    academy-getting her more involved   Corporate investment banker on aging resources   Daughter is looking for caregiver support groups [KH1.1]       Attribution       KH1.1 Steffanie Rainwater 08/12/23 13:15             History Review:           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    Chronic kidney disease     Depression, psychotic (CMS-HCC)     Diabetes mellitus (CMS-HCC)     in past    Emphysema of lung (CMS-HCC)     Financial difficulties     Frail elderly     Hearing impairment     Hernia     History of transfusion     Hyperlipidemia     Hypertension     Impaired mobility     Osteoporosis     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/08/2015    Visual impairment  Caregiver burden No   Cognitive Impairment No   Falls Risk Yes   Financial difficulty Yes   Frail Elderly Yes   Hearing impairment/loss Yes   Homeless No   Impaired mobility Yes   Inadequate social/family support No   Ineffective family coping No   Low Literacy No   Nonadherence to medication No   Non-english speaking No   Terminal Illness/Hospice No   Transportation barriers No   Visual impairment Yes             Past Surgical History:   Procedure Laterality Date    ABDOMINAL SURGERY      BLADDER SURGERY      BREAST CYST EXCISION      CHEMOTHERAPY  2012    bladder    GALLBLADDER SURGERY      stone removal    ILEOSTOMY  2012    PR COLONOSCOPY FLX DX W/COLLJ SPEC WHEN PFRMD N/A 02/09/2015    Procedure: COLONOSCOPY, FLEXIBLE, PROXIMAL TO SPLENIC FLEXURE; DIAGNOSTIC, W/WO COLLECTION SPECIMEN BY BRUSH OR WASH;  Surgeon: Dewaine Conger, MD;  Location: [facility]ED[facility] [place];  Service: Gastroenterology    PR RECONSTR TOTAL SHOULDER IMPLANT Left 04/08/2018    Procedure: ARTHROPLASTY, GLENOHUMERAL JOINT; TOTAL SHOULDER(GLENOID & PROXIMAL HUMERAL REPLACEMENT(EG, TOTAL SHOULDER);  Surgeon: Tomasa Rand, MD;  [facility]: [facility] OR [facility];  Service: Ortho Sports Medicine    PR SIGMOIDOSCOPY,BIOPSY N/A 03/11/2015    Procedure: SIGMOIDOSCOPY, FLEXIBLE; WITH BIOPSY, SINGLE OR MULTIPLE;  Surgeon: Wilburt Finlay, MD;  [facility]: [facility];  Service: Gastroenterology    PR XCAPSL CTRC RMVL INSJ IO LENS PROSTH W/O ECP Left 06/05/2022    Procedure: EXTRACAPSULAR CATARACT REMOVAL W/INSERTION OF INTRAOCULAR LENS PROSTHESIS, MANUAL OR MECHANICAL TECHNIQUE WITHOUT ENDOSCOPIC CYCLOPHOTOCOAGULATION;  Surgeon: Garner Gavel, MD;  [facility];  Service: Ophthalmology    PR XCAPSL CTRC RMVL INSJ IO LENS PROSTH W/O ECP Right 06/19/2022    Procedure: EXTRACAPSULAR CATARACT REMOVAL W/INSERTION OF INTRAOCULAR LENS PROSTHESIS, MANUAL OR MECHANICAL TECHNIQUE WITHOUT ENDOSCOPIC CYCLOPHOTOCOAGULATION;  Surgeon: Garner Gavel, MD;  Location: [facility];  Service: Ophthalmology             Family History   Problem Relation Age of Onset    Breast cancer Daughter 38    Diabetes Mother     Glaucoma Father     Colon cancer Neg Hx     Ovarian cancer Neg Hx     Endometrial cancer Neg Hx     Anesthesia problems Neg Hx     Bleeding Disorder Neg Hx            Ready to quit: Not Answered  Counseling given: Not Answered  Tobacco comments: 15cpd          Ready to quit: Not Answered  Counseling given: Not Answered  Tobacco comments: 15cpd                                   Social History     Substance and Sexual Activity   Drug Use No             Social History     Substance and Sexual Activity   Sexual Activity Not Currently         Social History Review:           Physicist, medical Strain: High Risk (  08/12/2023)    Overall Financial Resource Strain (CARDIA)     Difficulty of Paying Living Expenses: Hard              Food Insecurity: No Food Insecurity (08/12/2023)    Hunger Vital Sign     Worried About Running Out of Food in the Last Year: Never true     Ran Out of Food in the Last Year: Never true              Transportation Needs: No Transportation Needs (08/12/2023)    PRAPARE - Therapist, art (Medical): No     Lack of Transportation (Non-Medical): No              Physical Activity: Inactive (08/12/2023)    Exercise Vital Sign     Days of Exercise per Week: 0 days     Minutes of Exercise per Session: 0 min              Stress: Stress Concern Present (08/12/2023)    [employer] of Occupational Health - Occupational Stress Questionnaire     Feeling of Stress : To some extent              Intimate Partner Violence: Not At Risk (08/12/2023)    Humiliation, Afraid, Rape, and Kick questionnaire     Fear of Current or Ex-Partner: No     Emotionally Abused: No     Physically Abused: No     Sexually Abused: No              Alcohol Use: Not At Risk (08/12/2023)    Alcohol Use     How often do you have a drink containing alcohol?: Never     How many drinks containing alcohol do you have on a typical day when you are drinking?: 1 - 2     How often do you have 5 or more drinks on one occasion?: Never              Tobacco Use: High Risk (08/12/2023)    Patient History     Smoking Tobacco Use: Every Day     Smokeless Tobacco Use: Never     Passive Exposure: Not on file              Depression: Not at risk (08/04/2020)    PHQ-2     PHQ-2 Score: 1          Self-Health:       Oral health - How would you describe the condition of your mouth and teeth including false teeth or dentures?: (!) Poor (does not have any teeth)       Have you had a dental or oral exam in the last year?: (!) No     Reading/Writing:       Are you able to read?: Yes       Are you able to write?: Yes SDOH assessment complete?: Yes    Driving Concerns:       Do you drive?: No       Mode of Transportation: [employer] Engagement:       Do you use any community resources?: Yes       What community resources do you use? : Other Product manager)       What community resources are you interested in?: Wellness Program, Other, [employer], Dental (financial resources,would like to be refitted for  dentures)       Are you currently employed?: No       Are you currently volunteering with any community organization?: No    Summary of Brief Assessment:            Future Appointments   Date Time Provider Department Center   08/14/2023  2:00 PM Sherlyn Hay, RN [facility]   08/16/2023 10:45 AM Coralyn Helling, MD PSYSTEPGRNB [facility]   08/22/2023 11:30 AM [facility] NURSE OPTCVilcom [facility]   08/22/2023  1:20 PM Vanetta Shawl, MD [facility]   09/11/2023  1:30 PM Birder Robson, MD UNCPULSPCLET [facility]   11/13/2023  8:30 AM Hessie Dibble, Ruta Hinds, MD UNCHRTVASET [facility]       This patient is currently under review for Complex Case Management services. For progress, care plan changes, updates or recent discharges please contact CM.         Deliah Goody - Care Management Assistant  [facility]  [address]  [place], [place] [address]  P: [phone] F: [phone]  [email or web address]

## 2023-08-12 NOTE — Unmapped (Signed)
Pt has multiple medication refill requests. Pt has chronic pain in her shoulders and R ankle for which tramadol is not controlling, but reports taking Goody powder as a  supplemental pain control w/ fair results.

## 2023-08-12 NOTE — Unmapped (Signed)
Reporting R foot swelling and bilateral arm pain.

## 2023-08-13 NOTE — Unmapped (Shared)
[facility]  Emergency Department Provider Note     ED Clinical Impression     Final diagnoses:   Chronic left shoulder pain (Primary)      HPI, Medical Decision Making, ED Course     HPI: 82 y.o. female who has a past medical history of HTN, PVCs, COPD, hyperlipidemia, T2DM, arthritis,  who presents with ankle pain and shoulder pain. The patient reports chronic left shoulder pain that radiates down the LUE with associated paresthesias, as well as chronic right ankle pain and swelling. No recent falls or trauma. She has taken her prescribed gabapentin with minimal relief of her pain. She notes that she has been prescribed Tramadol for her pain, which has worked for her pain in the past, but she ran out of this medication. She denies any other complaints at this time.     BP 161/98  - Pulse 73  - Temp 36.9 ??C (98.4 ??F) (Oral)  - Resp 16  - Wt 61.2 kg (135 lb)  - SpO2 97%  - BMI 23.91 kg/m??     On exam, patient is well appearing and in no acute distress. Vital signs are within normal limits; patient is hemodynamically stable, afebrile. Exam is remarkable for mild tenderness and decreased ROM in the left shoulder, which is her baseline. 2+ DP and radial pulses bilaterally. Normal ROM in the ankles. No overlying erythema or increased warmth to touch.     DDx/MDM: Patient presents with chronic pain in the setting of running out of her tramadol prescription.  No new falls or trauma, do not suspect acute fracture or dislocation.  Do not believe she requires x-rays at this time.  Compartments are soft, do not suspect compartment syndrome.  No evidence of abscess or septic arthritis on exam.  Plan to treat patient with home dose of tramadol and discharged with recommendation to follow-up with PCP.  Strict return precautions were discussed and all questions were answered.  Patient verbalizes understanding of this and agrees with this plan.    Diagnostic workup as below. Will treat patient with Tramadol.    No orders of the defined types were placed in this encounter.    ED Course       Discussion of Management with other Physicians, QHP or Appropriate Source: None  Independent Interpretation of Studies: If applicable, documented in ED course above.  I have reviewed recent and relevant previous record, including: Outpatient notes - 06/13/2023 and 07/23/2023 Family Med notes for patient's past medical history.  Escalation of Care including OBS/Admission/Transfer was considered: However, patient was determined to be appropriate for outpatient management.  Social Determinants that significantly affected care: N/A  Prescription drugs considered but not prescribed: N/A  Diagnostic tests considered but not performed: Considered x-ray imaging however no falls or trauma, no point tenderness, thus low suspicion for fracture or dislocation and decision was made to hold off.  ____________________________________________    The case was discussed with the attending physician, who is in agreement with the above assessment and plan.     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    Chronic kidney disease     Depression, psychotic (CMS-HCC)     Diabetes mellitus (CMS-HCC)     in past    Emphysema of lung (CMS-HCC)     Financial difficulties     Frail elderly     Hearing impairment     Hernia     History of transfusion     Hyperlipidemia     Hypertension     Impaired mobility     Osteoporosis     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/08/2015    Visual impairment        Past Surgical History:   Procedure Laterality Date    ABDOMINAL SURGERY      BLADDER SURGERY      BREAST CYST EXCISION      CHEMOTHERAPY  2012    bladder    GALLBLADDER SURGERY      stone removal ILEOSTOMY  2012    PR COLONOSCOPY FLX DX W/COLLJ SPEC WHEN PFRMD N/A 02/09/2015    Procedure: COLONOSCOPY, FLEXIBLE, PROXIMAL TO SPLENIC FLEXURE; DIAGNOSTIC, W/WO COLLECTION SPECIMEN BY BRUSH OR WASH;  Surgeon: Dewaine Conger, MD;  Location: [facility] [place];  Service: Gastroenterology    PR RECONSTR TOTAL SHOULDER IMPLANT Left 04/08/2018    Procedure: ARTHROPLASTY, GLENOHUMERAL JOINT; TOTAL SHOULDER(GLENOID & PROXIMAL HUMERAL REPLACEMENT(EG, TOTAL SHOULDER);  Surgeon: Tomasa Rand, MD;  Location: [facility] OR [facility];  Service: Ortho Sports Medicine    PR SIGMOIDOSCOPY,BIOPSY N/A 03/11/2015    Procedure: SIGMOIDOSCOPY, FLEXIBLE; WITH BIOPSY, SINGLE OR MULTIPLE;  Surgeon: Wilburt Finlay, MD;  [facility]: [facility];  Service: Gastroenterology    PR XCAPSL CTRC RMVL INSJ IO LENS PROSTH W/O ECP Left 06/05/2022    Procedure: EXTRACAPSULAR CATARACT REMOVAL W/INSERTION OF INTRAOCULAR LENS PROSTHESIS, MANUAL OR MECHANICAL TECHNIQUE WITHOUT ENDOSCOPIC CYCLOPHOTOCOAGULATION;  Surgeon: Garner Gavel, MD;  Location: [facility] OR [facility];  Service: Ophthalmology    PR XCAPSL CTRC RMVL INSJ IO LENS PROSTH W/O ECP Right 06/19/2022    Procedure: EXTRACAPSULAR CATARACT REMOVAL W/INSERTION OF INTRAOCULAR LENS PROSTHESIS, MANUAL OR MECHANICAL TECHNIQUE WITHOUT ENDOSCOPIC CYCLOPHOTOCOAGULATION;  Surgeon: Garner Gavel, MD;  Location: [facility] OR [facility];  Service: Ophthalmology       Allergies  Lisinopril, Losartan, and Hctz [hydrochlorothiazide]    Family History  Family History   Problem Relation Age of Onset    Breast cancer Daughter 23    Diabetes Mother     Glaucoma Father     Colon cancer Neg Hx     Ovarian cancer Neg Hx     Endometrial cancer Neg Hx     Anesthesia problems Neg Hx     Bleeding Disorder Neg Hx        Social History  Social History     Tobacco Use    Smoking status: Every Day     Current packs/day: 1.00     Average packs/day: 1 pack/day for 65.3 years (65.3 ttl pk-yrs)     Types: Cigarettes     Start date: 04/09/1958    Smokeless tobacco: Never    Tobacco comments:     15cpd   Vaping Use    Vaping status: Never Used   Substance Use Topics    Alcohol use: No     Alcohol/week: 0.0 standard drinks of alcohol    Drug use: No        Physical Exam  VITAL SIGNS:      Vitals:    08/12/23 1302 08/12/23 1349 08/12/23 1707 08/12/23 1824   BP:  149/69 183/108 161/98   Pulse: 75 69 73    Resp:  16 16    Temp:  36.6 ??C (97.9 ??F) 36.7 ??C (98.1 ??F) 36.9 ??C (98.4 ??F)   TempSrc:  Oral Oral Oral   SpO2: 98% 99% 98% 97%   Weight:  61.2 kg (135 lb)       Constitutional: Alert and oriented. No acute distress.  Eyes: Conjunctivae are normal.  HEENT: Normocephalic and atraumatic. Conjunctivae clear. No congestion. Moist mucous membranes.   Cardiovascular: Rate as above, regular rhythm. Normal and symmetric distal pulses. Brisk capillary refill. Normal skin turgor. 2+ DP pulses bilaterally.   Respiratory: Normal respiratory effort. Breath sounds are normal. There are no wheezing or crackles heard.  Gastrointestinal: Soft, non-distended, non-tender.  Genitourinary: Deferred.  Musculoskeletal: Non-tender with normal range of motion in all extremities. There is mild tenderness and decreased ROM in the left shoulder, which is her baseline. Normal ROM in the ankles. No overlying erythema or increased warmth to touch.   Neurologic: Normal speech and language. No gross focal neurologic deficits are appreciated. Patient is moving all extremities equally, face is symmetric at rest and with speech.  Skin: Skin is warm, dry and intact. No rash noted. No foot wounds.  Psychiatric: Mood and affect are normal. Speech and behavior are normal.     Radiology     No orders to display       Pertinent labs & imaging results that were available during my care of the patient were independently interpreted by me and considered in my medical decision making (see chart for details).    Portions of this record have been created using Scientist, clinical (histocompatibility and immunogenetics). Dictation errors have been sought, but may not have been identified and corrected.    Documentation assistance was provided by Johnathan Hausen, Scribe, on August 12, 2023 at 7:05 PM for Suzzanne Cloud, MD.    Documentation assistance was provided by the scribe in my presence.  The documentation recorded by the scribe has been reviewed by me and accurately reflects the services I personally performed.

## 2023-08-13 NOTE — Unmapped (Signed)
Pt discharge instructions provided. Phone call to daughter concerning pickup successful.

## 2023-08-14 NOTE — Unmapped (Signed)
Complex Case Management  SUMMARY NOTE    Attempted to contact pt today at Cell number to complete initial assessment. Left message to return call.; 1st attempt    Discuss at next visit: Complete Initial Assessment

## 2023-08-16 ENCOUNTER — Ambulatory Visit: Admit: 2023-08-16 | Payer: MEDICARE

## 2023-08-16 NOTE — Unmapped (Signed)
Complex Case Management  SUMMARY NOTE    Attempted to contact pt today at Cell number to complete initial assessment. Left message to return call.; 2nd attempt, letter sent.    Discuss at next visit: No answer, letter sent      Sherlyn Hay, BSN, RN   [facility]  [address] 5th floor,  [place], [place] [address]  P [phone]  [email or web address]

## 2023-08-22 NOTE — Unmapped (Signed)
Tammy Braun has been contacted in regards to their refill of fluphenazine . At this time, they have declined refill due to  [facility] declined @ this time will follow-up in 4 weeks  . Refill assessment call date has been updated per the patient's request.

## 2023-08-26 ENCOUNTER — Institutional Professional Consult (permissible substitution): Admit: 2023-08-26 | Discharge: 2023-08-27 | Payer: MEDICARE

## 2023-08-26 MED ADMIN — fluPHENAZine decanoate (PROLIXIN) injection 50 mg: 50 mg | INTRAMUSCULAR | @ 17:00:00

## 2023-08-26 NOTE — Unmapped (Signed)
Administered long-acting injectable today,. See MAR for additional documentation. Evynn Boutelle, CMA

## 2023-09-03 NOTE — Unmapped (Signed)
You have a fax in your box requiring your signature.

## 2023-09-04 ENCOUNTER — Ambulatory Visit: Admit: 2023-09-04 | Discharge: 2023-09-05 | Payer: MEDICARE

## 2023-09-04 DIAGNOSIS — J432 Centrilobular emphysema: Principal | ICD-10-CM

## 2023-09-04 DIAGNOSIS — M1710 Unilateral primary osteoarthritis, unspecified knee: Principal | ICD-10-CM

## 2023-09-04 DIAGNOSIS — Z8551 Personal history of malignant neoplasm of bladder: Principal | ICD-10-CM

## 2023-09-04 DIAGNOSIS — I10 Essential (primary) hypertension: Principal | ICD-10-CM

## 2023-09-04 DIAGNOSIS — I6529 Occlusion and stenosis of unspecified carotid artery: Principal | ICD-10-CM

## 2023-09-04 DIAGNOSIS — E1149 Type 2 diabetes mellitus with other diabetic neurological complication: Principal | ICD-10-CM

## 2023-09-04 DIAGNOSIS — E559 Vitamin D deficiency, unspecified: Principal | ICD-10-CM

## 2023-09-04 DIAGNOSIS — M25571 Pain in right ankle and joints of right foot: Principal | ICD-10-CM

## 2023-09-04 DIAGNOSIS — F17200 Nicotine dependence, unspecified, uncomplicated: Principal | ICD-10-CM

## 2023-09-04 DIAGNOSIS — I639 Cerebral infarction, unspecified: Principal | ICD-10-CM

## 2023-09-04 DIAGNOSIS — M25561 Pain in right knee: Principal | ICD-10-CM

## 2023-09-04 LAB — CBC
HEMATOCRIT: 39.8 % (ref 34.0–44.0)
HEMOGLOBIN: 13 g/dL (ref 11.3–14.9)
MEAN CORPUSCULAR HEMOGLOBIN CONC: 32.5 g/dL (ref 32.0–36.0)
MEAN CORPUSCULAR HEMOGLOBIN: 29.4 pg (ref 25.9–32.4)
MEAN CORPUSCULAR VOLUME: 90.3 fL (ref 77.6–95.7)
MEAN PLATELET VOLUME: 8.5 fL (ref 6.8–10.7)
PLATELET COUNT: 208 10*9/L (ref 150–450)
RED BLOOD CELL COUNT: 4.41 10*12/L (ref 3.95–5.13)
RED CELL DISTRIBUTION WIDTH: 16.9 % — ABNORMAL HIGH (ref 12.2–15.2)
WBC ADJUSTED: 5.8 10*9/L (ref 3.6–11.2)

## 2023-09-04 LAB — COMPREHENSIVE METABOLIC PANEL
ALBUMIN: 4 g/dL (ref 3.4–5.0)
ALKALINE PHOSPHATASE: 89 U/L (ref 46–116)
ALT (SGPT): 8 U/L — ABNORMAL LOW (ref 10–49)
ANION GAP: 8 mmol/L (ref 5–14)
AST (SGOT): 13 U/L (ref <=34)
BILIRUBIN TOTAL: 0.3 mg/dL (ref 0.3–1.2)
BLOOD UREA NITROGEN: 22 mg/dL (ref 9–23)
BUN / CREAT RATIO: 23
CALCIUM: 9.7 mg/dL (ref 8.7–10.4)
CHLORIDE: 108 mmol/L — ABNORMAL HIGH (ref 98–107)
CO2: 22 mmol/L (ref 20.0–31.0)
CREATININE: 0.94 mg/dL
EGFR CKD-EPI (2021) FEMALE: 61 mL/min/{1.73_m2} (ref >=60)
GLUCOSE RANDOM: 100 mg/dL (ref 70–179)
POTASSIUM: 4.3 mmol/L (ref 3.4–4.8)
PROTEIN TOTAL: 7.5 g/dL (ref 5.7–8.2)
SODIUM: 138 mmol/L (ref 135–145)

## 2023-09-04 LAB — HEMOGLOBIN A1C
ESTIMATED AVERAGE GLUCOSE: 126 mg/dL
HEMOGLOBIN A1C: 6 % — ABNORMAL HIGH (ref 4.8–5.6)

## 2023-09-04 MED ORDER — TRAMADOL 50 MG TABLET
ORAL_TABLET | 5 refills | 0 days | Status: CP
Start: 2023-09-04 — End: ?

## 2023-09-04 NOTE — Unmapped (Signed)
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       Musculoskeletal and Integument    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       Other    Tobacco use disorder (Chronic)    History of bladder cancer     Other Visit Diagnoses       Right ankle pain, unspecified chronicity    -  Primary    Right knee pain, unspecified chronicity        Relevant Orders    XR Knee 1 or 2 Views Right (Completed)    Vitamin D deficiency        Relevant Orders    Vitamin D 25 Hydroxy (25OH D2 + D3) (Completed)            Labs  Reassured  Xrays  Consider inejctions  Updated problem list annotations  Database review and update  Med review and update  Lab review and update  Counseling provided and patient education    SUBJECTIVE:    Tammy Braun is a 82 y.o. 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 She started smoking about 65 years ago. She has a 65.4 pack-year smoking history. She has never used smokeless tobacco.    The following portions of the patient's history were reviewed and updated as appropriate: allergies, current medications, past family history, past medical history, past social history, past surgical history and problem list.    Review of systems for 10 organ systems conducted and no significant positives      OBJECTIVE:    Vital signs have been reviewed:   Vitals:    09/04/23 1631   BP: 150/88   Pulse: 63   Temp:

## 2023-09-05 MED ORDER — FUROSEMIDE 20 MG TABLET
ORAL_TABLET | 1 refills | 0 days | Status: CP
Start: 2023-09-05 — End: ?

## 2023-09-05 NOTE — Unmapped (Signed)
Form faxed to [phone]

## 2023-09-06 LAB — VITAMIN D 25 HYDROXY: VITAMIN D, TOTAL (25OH): 24.1 ng/mL (ref 20.0–80.0)

## 2023-09-09 NOTE — Unmapped (Signed)
Results Request Patient Name: Tammy Braun   Caller: Self (Patient)  Name of Caller: Self  Contact Method: Telephone Call: Time- Any Time [phone]    Type of Results: Imaging  Date of service test was completed: 09/04/2023

## 2023-09-10 NOTE — Unmapped (Signed)
Tammy Braun  Pulmonary Clinic - Initial Visit    Referring Physician :  Vanetta Shawl  PCP:     Vanetta Shawl, MD  Reason for Consult:   COPD     ASSESSMENT and PLAN     Tammy Braun is a 82 y.o. female with COPD.     Tammy Braun presented today to establish care at pulmonary clinic. She has evidence of pretty severe COPD per her imaging. Have been unable to get reliable PFTs. Fortunately, she has limited symptoms though these may due to her low mobility due to her other medical conditions. We discussed at length today the treatment of COPD. We discussed that smoking cessation is one of the best things she can do for the health of her lungs. She is uncertain if she is going to be able to quit, but willing to engage in smoking cessation program. Encouraged her to try her best. In addition, discussed immunizations to prevent pulmonary infections. She declined despite extensive counseling and motivational interviewing. Also discussed starting maintenance inhaler today. Given her symptoms, LABA-LAMA would be reasonable given her every other day symptoms. However, she did not want to take a controller inhaler and preferred to continue her PRN albuterol as this is what she felt most comfortable with despite counseling. She would like pulmonary rehab in [place] which we will work to arrange.     Inhalers:  Continue PRN albuterol  If she becomes amendable, would start LABA-LAMA   Smoking cessation referral placed and smoking cessation line given   Referral for pulmonary rehab placed.   Would encourage her to get vaccinations including covid, flu, pneumonia, RSV       Plan of care was discussed with the patient who acknowledged understanding and is in agreement.    Patient will Return in about 6 months (around 03/10/2024). or sooner if needed.      Tammy Braun was seen, examined and discussed with Dr. Ala Dach who agrees with the assessment and plan above.     ZO:XWRU Benedetto Goad, Vanetta Shawl, MD      Valeda Malm  Pulmonary and Critical Care Fellow PGY4        HISTORY:      History of Present Illness: Tammy Braun is a 82 y.o. female current 1 pack/day smoker (97.5 pack year) with PMHx HTN, PVC, CAD, TR, chart hx of pulm HTN, stroke, COPD, DM, schizoaffective d/o who is seen in consultation at the request of Vanetta Shawl,* for comprehensive evaluation of COPD.       Had been seen in pulmonary HTN clinic in 2021. Was referred at that time given echo c/f pulm HTN. Noted to have COPD, and was taking Spiriva. Was smoking currently at that time. Determined to have group 2 and group 3 in setting of COPD.     She states she had cough and some shortness of breath with exertion for many, many years. Denies any hemoptysis. She current takes albuterol PRN, about every other day, mostly for productive cough. Not on any maintenance inhalers. She denies any wheezing. However, her daughter, who was present in the room said her breathing is very audible when she walks. Her ambulation is limited by her significant OA, so uncertain if SOB would be limiting. Denies orthopnea and PND. Cough not worse at night, worse during the day. Is currently smoking 1 ppd. Reduced from 1.5 ppd. Has been smoking since she was 82 years old.  OSH records and referral documentation reviewed.     Review of Systems:  A comprehensive review of systems was completed and negative except as noted in HPI.    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    Chronic kidney disease     Depression, psychotic (CMS-HCC)     Diabetes mellitus (CMS-HCC)     in past    Emphysema of lung (CMS-HCC)     Financial difficulties     Frail elderly     Hearing impairment     Hernia     History of transfusion     Hyperlipidemia     Hypertension     Impaired mobility Osteoporosis     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/08/2015    Visual impairment        PSHx:  Past Surgical History:   Procedure Laterality Date    ABDOMINAL SURGERY      BLADDER SURGERY      BREAST CYST EXCISION      CHEMOTHERAPY  2012    bladder    GALLBLADDER SURGERY      stone removal    ILEOSTOMY  2012    PR COLONOSCOPY FLX DX W/COLLJ SPEC WHEN PFRMD N/A 02/09/2015    Procedure: COLONOSCOPY, FLEXIBLE, PROXIMAL TO SPLENIC FLEXURE; DIAGNOSTIC, W/WO COLLECTION SPECIMEN BY BRUSH OR WASH;  Surgeon: Dewaine Conger, MD;  Location: [facility] [place];  Service: Gastroenterology    PR RECONSTR TOTAL SHOULDER IMPLANT Left 04/08/2018    Procedure: ARTHROPLASTY, GLENOHUMERAL JOINT; TOTAL SHOULDER(GLENOID & PROXIMAL HUMERAL REPLACEMENT(EG, TOTAL SHOULDER);  Surgeon: Tomasa Rand, MD;  [facility];  Service: Ortho Sports Braun    PR SIGMOIDOSCOPY,BIOPSY N/A 03/11/2015    Procedure: SIGMOIDOSCOPY, FLEXIBLE; WITH BIOPSY, SINGLE OR MULTIPLE;  Surgeon: Wilburt Finlay, MD;  [facility];  Service: Gastroenterology    PR XCAPSL CTRC RMVL INSJ IO LENS PROSTH W/O ECP Left 06/05/2022    Procedure: EXTRACAPSULAR CATARACT REMOVAL W/INSERTION OF INTRAOCULAR LENS PROSTHESIS, MANUAL OR MECHANICAL TECHNIQUE WITHOUT ENDOSCOPIC CYCLOPHOTOCOAGULATION;  Surgeon: Garner Gavel, MD;  Location: [facility];  Service: Ophthalmology    PR XCAPSL CTRC RMVL INSJ IO LENS PROSTH W/O ECP Right 06/19/2022    Procedure: EXTRACAPSULAR CATARACT REMOVAL W/INSERTION OF INTRAOCULAR LENS PROSTHESIS, MANUAL OR MECHANICAL TECHNIQUE WITHOUT ENDOSCOPIC CYCLOPHOTOCOAGULATION;  Surgeon: Garner Gavel, MD;  Location: [facility] OR [facility];  Service: Ophthalmology        FHx:  Family History   Problem Relation Age of Onset    Breast cancer Daughter 59    Diabetes Mother     Glaucoma Father     Colon cancer Neg Hx     Ovarian cancer Neg Hx     Endometrial cancer Neg Hx     Anesthesia problems Neg Hx     Bleeding Disorder Neg Hx         Social   Lives with daughter    Smoking (cigarettes, vaping, marijuana): Current smoker, 1 ppd, reduced from 1.5 ppd. Started smoking at 17. 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     Exposures:   - asbestos: None   - silica: None  - ceramics: None    - metals: None   - dusts: None   - molds: None   - hot tub/sauna/flooding: None     Pets: A dog. No birds.     Medications:     Current Outpatient Medications:     albuterol HFA 90 mcg/actuation inhaler, Inhale 2 puffs every four (4) hours as needed for wheezing., Disp: 18 g, Rfl: 11    aspirin-acetaminophen-caffeine (EXCEDRIN MIGRAINE) 250-250-65 mg per tablet, Take 1 tablet by mouth every six (6) hours as needed for pain., Disp: , Rfl:     candesartan (ATACAND) 16 MG tablet, 1 po bid, Disp: 60 tablet, Rfl: 11    cyclobenzaprine (FLEXERIL) 5 MG tablet, Take 1 tablet (5 mg total) by mouth two (2) times a day as needed for muscle spasms., Disp: 60 tablet, Rfl: 5    doxycycline (VIBRA-TABS) 100 MG tablet, 1 po q day for 10 days, Disp: 10 tablet, Rfl: 1    fluPHENAZine decanoate (PROLIXIN) 25 mg/mL injection, Inject 2 mL (50 mg total) into the muscle every thirty (30) days., Disp: 5 mL, Rfl: 11    furosemide (LASIX) 20 MG tablet, TAKE 2 TABLETS BY MOUTH DAILY FOR 5 DAYS, THEN 1 TABLET DAILY, Disp: 90 tablet, Rfl: 1    gabapentin (NEURONTIN) 100 MG capsule, Take 1 capsule (100 mg total) by mouth Three (3) times a day., Disp: 90 capsule, Rfl: 2    inhalational spacing device (E-Z SPACER) Spcr, 1 each by Miscellaneous route every four (4) hours as needed., Disp: 1 each, Rfl: 0    MEDICAL SUPPLY ITEM, Entreal formula nutritionally supplement complete caloric density, Disp: 3 application, Rfl: 3    senna (SENOKOT) 8.6 mg tablet, Take 2 tablets by mouth nightly as needed for constipation., Disp: 60 tablet, Rfl: 0    traMADol (ULTRAM) 50 mg tablet, 1 po at bedtime prn pain, Disp: 30 tablet, Rfl: 5    trolamine salicylate (ASPERCREME TOP), Apply 1 Application topically as needed (pain)., Disp: , Rfl:     tiotropium (SPIRIVA WITH HANDIHALER) 18 mcg inhalation capsule, Place 1 capsule (18 mcg total) into inhaler and inhale daily as needed. (Patient not taking: Reported on 09/11/2023), Disp: 31 capsule, Rfl: 5    Current Facility-Administered Medications:     fluPHENAZine decanoate (PROLIXIN) injection 50 mg, 50 mg, Intramuscular, Q30 Days, Lovett Calender, MD, 50 mg at 08/26/23 1323     Allergies: Reviewed.    Other History:  The past medical history, surgical history, social history, family history, medications and allergies were personally reviewed and updated in the patient's electronic medical record.           PHYSICAL EXAM:      Physical Exam:  Vitals:    09/11/23 1334   BP: 108/74   BP Site: R Arm   BP Position: Sitting   BP Cuff Size: Small   Pulse: 62   SpO2: 93%   Weight: 61.2 kg (135 lb)     Body mass index is 23.33 kg/m??.    General: well appearing, appears stated age, sitting in wheel chair, Non-toxic. NAD.   Eyes: EOMI grossly. Anicteric sclera. Marland Kitchen  Respiratory: CTAB, no wheezes, rales, or rhonchi   Cardiovascular: RRR, +s1/s2, no m/r/g. No LEE.   Musculoskeletal/extremities:  moving all extremities   Skin: No skin rashes on clothed exam  Neuro: Alert and oriented x 3. CN grossly intact. No focal neurological deficits grossly        LABORATORY and RADIOLOGY DATA:     Pulmonary Function Tests/Interpretation:     Pulmonary Function Test Results  Date FVC FEV1 FEV1/FVC TLC DLCO Other   07/30/23 Uninterruptable Uninterruptable Uninterruptable Uninterruptable Uninterruptable               : None      Pertinent Laboratory Data:  Reviewed        Pertinent Imaging Data:  Images were personally reviewed with attending.     CT Chest w/o Contrat (05/2023): 1. As evident on prior imaging, there is extensive volume in the lung bases associated with mass effect from herniation of a large amount of abdominal contents into the caudal aspect of the mediastinum. However, this study demonstrates new complete lack of aeration of the lower lobe of the left lung which likely combination of atelectasis and consolidation. Associated extensive debris within the segmental airways may relate to infection, retained mucus, or aspiration. I favor the latter. No potentially associated obstructing mass lesion is identified. However, consider CT imaging surveillance in 2 to 3 months to ensure improvement.      2. Notable chronic findings include:   - Calcified atherosclerotic disease within the coronary arterial distribution (heavy)   - Advanced destructive centrilobular and substantial paraseptal emphysema, similar to prior.   -Prominent pulmonary pulmonary trunk (3. 7 cm), which may reflect some element of pulmonary arterial hypertension          Echo (12/2022):    1. The left ventricle is normal in size with mildly increased wall  thickness.    2. The left ventricular systolic function is normal, LVEF is visually  estimated at > 55%.    3. The mitral valve leaflets are mildly thickened with normal leaflet  mobility.    4. Mitral annular calcification is present (moderate).    5. There is mild mitral valve regurgitation.    6. The aortic valve is trileaflet with mildly thickened leaflets with normal  excursion.    7. There is mild aortic regurgitation.    8. The right ventricle is normal in size, with normal systolic function.    9. There is moderate to severe tricuspid regurgitation.    10. There is moderate pulmonary hypertension.    11. There is mild pulmonic regurgitation

## 2023-09-11 ENCOUNTER — Ambulatory Visit
Admit: 2023-09-11 | Discharge: 2023-09-12 | Payer: MEDICARE | Attending: Student in an Organized Health Care Education/Training Program | Primary: Student in an Organized Health Care Education/Training Program

## 2023-09-11 DIAGNOSIS — F17200 Nicotine dependence, unspecified, uncomplicated: Principal | ICD-10-CM

## 2023-09-11 DIAGNOSIS — J432 Centrilobular emphysema: Principal | ICD-10-CM

## 2023-09-11 NOTE — Unmapped (Addendum)
It was a pleasure see you in clinic today! You were seen in clinic today by Dr.  Katrinka Blazing & Dr. Ala Dach      Here's a summary of what we discussed during your visit:  Continue your as needed inhaler  Work on stopping smoking. I referred you to the smoking clinic. More information below.   If you want to start a regular inhaler or get vaccines, just let us know   We will work on getting you to pulmonary rehab    Follow up with me in 6 months!    Tammy Malm, MD   Pulmonary & Critical Care Fellow  [facility]  Phone: [phone]  Fax: [phone]    To contact your care team, you can either send a message via MyChart or contact the clinic at [phone].    How do I request medication refills?  Request a refill via [facility] (patient portal), call clinic at [phone] or have your pharmacy fax the request to [phone].    [facility]: [phone] *Pharmacy can mail medications to your home. You must call to request the medication be mailed.[facility]: [phone]  [facility]: [phone]    Here are some things you should know about contacting the [facility]:  Please be advised Epic now releases test results to MyChart as soon as they are available which means you will see your test results before I do.    The best way to reach your doctor for non-urgent matters is through MyChart. I can usually respond within two to three business day but I do not check messages after hours (evenings, weekends, and holidays) and often have other duties (inpatient hospital work, Producer, television/film/video activities, teaching) that make me unavailable.    - If you have sent a MyChart message to the clinic and have not received a response after three business days, please call our clinic at [phone] to speak to a nurse.     If you have an urgent issue that you feel needs a response the same day, you should also contact your primary care provider or be evaluated at an Urgent Care clinic.    For urgent lung issues after hours, you can call the hospital operator [phone]) and ask for the Pulmonary Fellow On Call. This doctor can provide some guidance and will send a message to your regular lung doctor the next morning.    If there is an emergency, call 911 or go to your closest emergency room.    I don't have a MyChart. Why should I get one?   - It's encrypted, so your information is secure  - It's a quick, easy way to contact the care team, manage appointments, see test results, and more!    How do I sign-up for MyChart?   - Download the MyChart app from the [employer] or [employer] and sign-up in the app  - Sign-up online at [email or web address]            Quitting smoking is the single most important thing you can do to improve your health today.     Your health care team of doctors and nurses understand how hard it is to quit smoking. The first step is to understand why it is important to YOU to stop smoking. Here are some things to think about:     1. It is never too late to quit, no matter how  old you are or how long you have been smoking. We know from studies that even people who quit at the age of 58-60 can gain 4-5 more years of life than if they kept on smoking.      2. If you are smoking one pack a day now, and you quit for a year, you save around $2000! Think about what you can do with that money- pay off bills, take a trip with your family or something else important to you.      3.  Your smoking harms the people around you- your spouse, your children, your friends. If you smoke with friends and family, your quitting can help them quit too. Don't just think about the benefits for yourself.     4. Quitting smoking will decrease the amount of time you spend in doctor's offices and the hospital. It will also improve your overall health.     5. When you stop smoking, so many things change- food tastes better, your clothes and breath don't smell, you are less winded- think of all these benefits!     6. Yes- quitting smoking is hard- but it is the single most important thing you can do to improve your health. You owe it to yourself, your family and your doctors who care for you.      7. There are medicines that can help ease the craving. But the first step starts with you being ready to quit.      8. There is support to help you learn more about quitting!  -- www.Quitline[place].com and TotallyWiped.com.ee  -- MechanicalArm.dk, BecomeAnEx.org and www.smokingstopshere.com  -- www.Livehelp.cancer.gov provides live online chat support  -- There is a free quit line (1-800-QUIT-NOW) that can provide counseling and sometimes free patches/gum.      The [place] [facility] [place] Nicotine Dependence Program can provide you with the information and tools you need to become tobacco free. A treatment specialist will work with you to make a plan that works for you and your lifestyle including dealing with withdrawal, using tobacco cessation medications, and strategies for changing behaviors that are linked to your tobacco use. We will follow-up with you for adjustment of the treatment plan and medications, as needed, and give you on-going support to stop smoking and prevent relapse.     Individual appointments with a tobacco treatment specialist at the [facility] are scheduled on Tuesday mornings and Wednesdays. In order to spend the majority of the first visit focusing on the treatment plan, we ask new patients to complete an Initial Assessment online before the first appointment.     To make an appointment today call [phone] or e-mail [email or web address]     Now that you are ready to quit, here is your plan to quit smoking:     1. Set a quit date for the next week. After this day, you should not smoke any cigarettes- not even one! Having one cigarette can cause you to start smoking as much as you are now. Tell your friends and family about your quit date. They can be your support during the hard times you may encounter while you quit.     2. Prior to your quit date, think about how you will plan to handle cravings. Some ways to handle this are to avoid triggers (reduce coffee and alcohol, take a break from friends who smoke) and have an action plan for a craving (take a walk around the block, think about how  not smoking improves your health and those around you).     3. The best approach is to use the nicotine patch COMBINED with the nicotine gum.     Here is how to use it:     a. Start with the 21 mg patch, one a day: Place on in the morning, leave on all day- it is OK to remove at bedtime if you have trouble sleeping.      b. Use the nicotine gum for cravings: If you have a craving, leave the patch on, place a piece of gum in your mouth and chew until the flavor is released. Then stop chewing and place in between the teeth and cheek (this is where the nicotine is absorbed)- park it there until the flavor goes away, then chew 2-3 more chews until the flavor comes back and re-park in in your cheek. Continue for 30 minutes.     Avoid over-chewing as this releases the nicotine too quickly and it will be swallowed and not absorbed. You can use the gum several times a day for cravings.     4. Plan some rewards for yourself to help you stay on track with quitting:  -- After your first week of quitting, buy something nice for yourself with the money you saved  -- After the first month of quitting, go out and celebrate with family and friends.

## 2023-09-13 NOTE — Unmapped (Signed)
I saw and evaluated the patient, participating in the key portions of the service.  I reviewed the resident???s note.  I agree with the resident???s findings and plan. Simonne Martinet, MD

## 2023-09-17 DIAGNOSIS — R059 Cough: Principal | ICD-10-CM

## 2023-09-17 DIAGNOSIS — G44229 Chronic tension-type headache, not intractable: Principal | ICD-10-CM

## 2023-09-17 DIAGNOSIS — J189 Pneumonia, unspecified organism: Principal | ICD-10-CM

## 2023-09-17 MED ORDER — ALBUTEROL SULFATE HFA 90 MCG/ACTUATION AEROSOL INHALER
RESPIRATORY_TRACT | 11 refills | 0 days | Status: CP | PRN
Start: 2023-09-17 — End: ?

## 2023-09-17 MED ORDER — CYCLOBENZAPRINE 5 MG TABLET
ORAL_TABLET | Freq: Two times a day (BID) | ORAL | 5 refills | 30 days | Status: CP | PRN
Start: 2023-09-17 — End: ?

## 2023-09-17 MED ORDER — TIOTROPIUM BROMIDE 18 MCG CAPSULE WITH INHALATION DEVICE
ORAL_CAPSULE | Freq: Every day | RESPIRATORY_TRACT | 5 refills | 31 days | Status: CP | PRN
Start: 2023-09-17 — End: ?

## 2023-09-18 NOTE — Unmapped (Signed)
[facility] Clinic Administered Medication Refill Coordination Note      NAME:Tammy Braun DOB: 08-05-1941      Medication: fluphenazine decanoate    Day Supply: 28 days      SHIPPING      Next delivery from [facility] [phone]) to [facility] Clinic for Audrey Alonzo is scheduled for 09/20.    Clinic contact: [name]     Patient's next nurse visit for administration: 09/23.    We will follow up with clinic monthly for standard refill processing and delivery.      Gerturde Kuba Samella Parr  [facility]

## 2023-09-19 MED FILL — FLUPHENAZINE DECANOATE 25 MG/ML INJECTION SOLUTION: INTRAMUSCULAR | 28 days supply | Qty: 5 | Fill #8

## 2023-09-23 ENCOUNTER — Institutional Professional Consult (permissible substitution): Admit: 2023-09-23 | Discharge: 2023-09-24 | Payer: MEDICARE

## 2023-09-23 MED ADMIN — fluPHENAZine decanoate (PROLIXIN) injection 50 mg: 50 mg | INTRAMUSCULAR | @ 16:00:00

## 2023-09-23 NOTE — Unmapped (Signed)
Prolixin dec 50 mg administered via intramuscular injection to patient's right deltoid. Patient tolerated injection well. Next injection scheduled for 4 weeks.

## 2023-09-26 ENCOUNTER — Ambulatory Visit: Admit: 2023-09-26 | Discharge: 2023-09-27 | Payer: MEDICARE

## 2023-09-26 DIAGNOSIS — E1149 Type 2 diabetes mellitus with other diabetic neurological complication: Principal | ICD-10-CM

## 2023-09-26 DIAGNOSIS — J432 Centrilobular emphysema: Principal | ICD-10-CM

## 2023-09-26 DIAGNOSIS — Z8551 Personal history of malignant neoplasm of bladder: Principal | ICD-10-CM

## 2023-09-26 DIAGNOSIS — F259 Schizoaffective disorder, unspecified: Principal | ICD-10-CM

## 2023-09-26 DIAGNOSIS — M1610 Unilateral primary osteoarthritis, unspecified hip: Principal | ICD-10-CM

## 2023-09-26 DIAGNOSIS — M25571 Pain in right ankle and joints of right foot: Principal | ICD-10-CM

## 2023-09-26 DIAGNOSIS — M25572 Pain in left ankle and joints of left foot: Principal | ICD-10-CM

## 2023-09-26 DIAGNOSIS — M1710 Unilateral primary osteoarthritis, unspecified knee: Principal | ICD-10-CM

## 2023-09-26 DIAGNOSIS — I10 Essential (primary) hypertension: Principal | ICD-10-CM

## 2023-09-26 DIAGNOSIS — I272 Pulmonary hypertension, unspecified: Principal | ICD-10-CM

## 2023-09-26 DIAGNOSIS — M158 Other polyosteoarthritis: Principal | ICD-10-CM

## 2023-09-26 DIAGNOSIS — I639 Cerebral infarction, unspecified: Principal | ICD-10-CM

## 2023-09-26 DIAGNOSIS — M47812 Spondylosis without myelopathy or radiculopathy, cervical region: Principal | ICD-10-CM

## 2023-09-26 DIAGNOSIS — G603 Idiopathic progressive neuropathy: Principal | ICD-10-CM

## 2023-09-26 DIAGNOSIS — M67912 Unspecified disorder of synovium and tendon, left shoulder: Principal | ICD-10-CM

## 2023-09-26 MED ORDER — BENZONATATE 100 MG CAPSULE
ORAL_CAPSULE | Freq: Four times a day (QID) | ORAL | 1 refills | 8 days | Status: CP | PRN
Start: 2023-09-26 — End: 2024-09-25

## 2023-09-26 NOTE — Unmapped (Signed)
Chief Complaint   Patient presents with    Follow-up      Both legs hurts and left arm has well. The pain has been going on for awhile. 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       Nervous and Auditory    Peripheral neuropathy       Musculoskeletal and Integument    Osteoarthrosis neck    Hip arthritis mild    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    Disorder of left rotator cuff    Arthritis of neck       Other    Schizoaffective disorder (CMS-HCC)    Arthralgia of both ankles    History of bladder cancer       Follow 6 weeks  Updated problem list annotations  Database review and update  Med review and update  Lab review and update  Counseling provided and patient education    40 minutes with patient- 1/2 in face to face counseling on chronic health conditions and  pain    SUBJECTIVE:    Ms. Hersman is a 82 y.o. female that presents to clinic today regarding the following issues:    Mult issues  Pain mult areas  Not wanting injections  Takes meds some helps  Knows to quit  Not wanting meds  Saw pulm  Recently went to beach      reports that she has been smoking cigarettes. She started smoking about 65 years ago. She has a 65.5 pack-year smoking history. She has never used smokeless tobacco.    The following portions of the patient's history were reviewed and updated as appropriate: allergies, current medications, past family history, past medical history, past social history, past surgical history and problem list.    Review of systems for 10 organ systems conducted and no significant positives      OBJECTIVE:    Vital signs have been reviewed:   Vitals:    09/26/23 1434   BP: 152/88   Pulse: 62   Temp:      vss

## 2023-10-04 ENCOUNTER — Ambulatory Visit: Admit: 2023-10-04 | Discharge: 2023-10-05 | Payer: MEDICARE

## 2023-10-04 DIAGNOSIS — Z79899 Other long term (current) drug therapy: Principal | ICD-10-CM

## 2023-10-04 DIAGNOSIS — F259 Schizoaffective disorder, unspecified: Principal | ICD-10-CM

## 2023-10-04 NOTE — Unmapped (Signed)
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 Overall stable in terms of mood and psychotic symptoms. Discussed using the senior center more frequently to better occupy her time.    Identifying Information:  Tammy Braun is a 82 y.o. female with a history of schizoaffective disorder. First presented to [facility] clinic September 2021. She has been stable on prolixin 50 mg q30 days for the past 30 years. Per PCP, patient has had worsening of depressive symptoms, though patient did not endorse. Reports auditory hallucinations and denies any CAH. Reports some anxiety related to impatience waiting on others, but finds this manageable. Patient is not interested in any new medications, but could consider increasing the frequency of prolixin administration if needed. March 2022, augmenting with mirtazapine for low mood and sleep. Could also consider augmenting with SRNI or wellbutrin for depression.     Risk Assessment:  A full suicide and violence risk assessment was performed as part of this patient's initial evaluation with [facility] outpatient psychiatry.  There is no new acute risk for suicide or violence at this time. The patient was educated about relevant modifiable risk factors including following recommendations for treatment of psychiatric illness and abstaining from substance abuse.   While future psychiatric events cannot be accurately predicted, the patient does not currently require acute inpatient psychiatric care and does not currently meet [facility] involuntary commitment criteria.      Plan:    Problem: Schizoaffective disorder  Status of problem: chronic and stable  Interventions:   - Continue Prolixin 50 mg IM q30 days    # TUD  - denies wanting to quit at this time      #High Risk Medication Monitoring   Lab Results   Component Value Date    A1C 6.0 (H) 09/04/2023      Lab Results   Component Value Date    CHOL 157 11/02/2022    CHOL 179 11/03/2021    CHOL 161 09/24/2019     Lab Results   Component Value Date    HDL 51 11/02/2022    HDL 50 11/03/2021    HDL 50 09/24/2019     Lab Results   Component Value Date    LDL 88 11/02/2022    LDL 107 (H) 11/03/2021    LDL 88 09/24/2019     Lab Results   Component Value Date    VLDL 18.4 11/02/2022    VLDL 21.8 11/03/2021    VLDL 23.4 09/24/2019     Lab Results   Component Value Date    CHOLHDLRATIO 3.1 11/02/2022    CHOLHDLRATIO 3.6 11/03/2021    CHOLHDLRATIO 3.2 09/24/2019     Lab Results   Component Value Date    TRIG 92 11/02/2022    TRIG 109 11/03/2021    TRIG 117 09/24/2019      BP Readings from Last 3 Encounters:   10/04/23 170/86   09/26/23 152/88   09/11/23 108/74       (According to daughter patient's BP is erratic when not measured manually^) (patient was asymptomatic on today's visit)    Psychotherapy provided:  No billable psychotherapy service provided.    Patient has been given this writer's contact information as well as the [facility] Psychiatry urgent line number. The patient has been instructed to call 911 for emergencies.    Patient was seen  and plan of care was discussed with the Attending MD,Dr. Dominic Pea, who agrees with the above statement and plan.    Subjective:    Interval History:     Denies mood/psychosis issues other than infrequent AH. Usually when she thinks about stressful things. Otherwise she is eating, sleeping and socializing well. Talked about going to the senior center to keep busy. Continues smoking, pre contemplative.     Objective:    Mental Status Exam:  Appearance:    Appears stated age   Motor:   No abnormal movements   Speech/Language:    Normal rate, volume, tone, fluency and Impaired articulation   Mood:   Good   Affect:   Calm, Cooperative and Euthymic   Thought process and Associations:   Logical, linear, clear, coherent, goal directed   Abnormal/psychotic thought content:     Denies SI, HI, self harm, delusions, obsessions, paranoid ideation, or ideas of reference   Perceptual disturbances:     Endorses very infrequent auditory hallucinations   Other:   Insight, judgment, and impulse control are fair     Visit was completed face to face.    I personally spent 30 minutes face-to-face and non-face-to-face in the care of this patient, which includes all pre, intra, and post visit time on the date of service.  All documented time was specific to the E/M visit and does not include any procedures that may have been performed.       Coralyn Helling, MD

## 2023-10-04 NOTE — Unmapped (Signed)
Follow-up instructions:  - Please continue taking your medications as prescribed for your mental health.   - Do not make changes to your medications, including taking more or less than prescribed, unless under the supervision of your physician. Be aware that some medications may make you feel worse if abruptly stopped  - Please refrain from using illicit substances, as these can affect your mood and could cause anxiety or other concerning symptoms.   - Seek further medical care for any increase in symptoms or new symptoms such as thoughts of wanting to hurt yourself or hurt others.     Contact info:  Life-threatening emergencies: Call 911, the 988 suicide and crisis lifeline, or go to the nearest ER for medical or psychiatric attention.           Issues that need urgent attention but are not life threatening: Call the clinic outpatient front desk for assistance:    - [phone] for [facility] adult psychiatry clinics located at [address] [facility]  - [phone] for [facility] child and adolescent psychiatry clinics located at [address]  - [phone] for [facility] and adult psychiatry clinics located at [address] [facility]    Non-urgent routine concerns and questions: Send a message through [facility] or call our clinic front desk.    Refill requests: Check with your pharmacy to initiate refill requests.    Regarding appointments:  - If you need to cancel your appointment, we ask that you call your clinic at least 24 hours before your scheduled appointment at the numbers listed above.  - If for any reason you arrive 15 minutes later than your scheduled appointment time, you may not be seen and your visit may be rescheduled.  - Please remember that we will not automatically reschedule missed appointments.  - If you miss two (2) appointments without letting us know in advance, you will likely be referred to a provider in your community.  - We will do our best to be on time. Sometimes an emergency will arise that might cause your clinician to be late. We will try to inform you of this when you check in for your appointment. If you wait more than 15 minutes past your appointment time without such notice, please speak with the front desk staff.    In the event of bad weather, the clinic staff will attempt to contact you, should your appointment need to be rescheduled. Additionally, you can call the Patient Weather Line [phone] for system-wide clinic status    For more information and reminders regarding clinic policies (these were provided when you were admitted to the clinic), please ask the front desk.

## 2023-10-05 DIAGNOSIS — F259 Schizoaffective disorder, unspecified: Principal | ICD-10-CM

## 2023-10-08 NOTE — Unmapped (Signed)
I saw and evaluated the patient, participating in the key portions of the service.  I reviewed the resident???s note.  I agree with the resident???s findings and plan. Augusto Garbe, MD

## 2023-10-09 NOTE — Unmapped (Signed)
Tammy Braun has been contacted in regards to their refill of Fluphenazine. At this time,[name]  declined refill due to having  2 doses  we will follow-up in 2 months . Refill assessment call date has been updated per the patient's request.

## 2023-10-16 MED ORDER — FLUPHENAZINE DECANOATE 25 MG/ML INJECTION SOLUTION
INTRAMUSCULAR | 11 refills | 60 days | Status: CP
Start: 2023-10-16 — End: ?

## 2023-10-21 ENCOUNTER — Institutional Professional Consult (permissible substitution): Admit: 2023-10-21 | Discharge: 2023-10-22 | Payer: MEDICARE

## 2023-10-21 MED ADMIN — fluPHENAZine decanoate (PROLIXIN) injection 50 mg: 50 mg | INTRAMUSCULAR | @ 16:00:00

## 2023-10-21 NOTE — Unmapped (Deleted)
Administered long-acting injectable today,. See MAR for additional documentation. Sharyl Nimrod, CMA

## 2023-10-21 NOTE — Unmapped (Signed)
Administered long-acting injectable today. See MAR for additional documentation. Sharyl Nimrod, CMA

## 2023-10-29 ENCOUNTER — Ambulatory Visit: Admit: 2023-10-29 | Discharge: 2023-10-30 | Payer: MEDICARE

## 2023-10-29 DIAGNOSIS — M1710 Unilateral primary osteoarthritis, unspecified knee: Principal | ICD-10-CM

## 2023-10-29 DIAGNOSIS — J432 Centrilobular emphysema: Principal | ICD-10-CM

## 2023-10-29 DIAGNOSIS — G9589 Other specified diseases of spinal cord: Principal | ICD-10-CM

## 2023-10-29 DIAGNOSIS — G603 Idiopathic progressive neuropathy: Principal | ICD-10-CM

## 2023-10-29 DIAGNOSIS — E1149 Type 2 diabetes mellitus with other diabetic neurological complication: Principal | ICD-10-CM

## 2023-10-29 DIAGNOSIS — I639 Cerebral infarction, unspecified: Principal | ICD-10-CM

## 2023-10-29 MED ORDER — DULOXETINE 30 MG CAPSULE,DELAYED RELEASE
ORAL_CAPSULE | Freq: Every day | ORAL | 11 refills | 30 days | Status: CP
Start: 2023-10-29 — End: 2024-10-28

## 2023-10-29 NOTE — Unmapped (Signed)
ORTHOPAEDIC NEW CLINIC NOTE     ASSESSMENT:  Tammy Braun is a 82 y.o. female with:  1. Chronic R shoulder pain  2. Chronic L arm pain    PLAN:  This Soucek continues to complain of left shoulder pain.  This has been ongoing for the last several years though she had a good early response to surgery.  No obvious adverse features are appreciated on x-ray today other than some mild scapular notching.  We discussed revision, though given a clear lack of etiology of pain, and her age, I would worry that the complication rate exceeds the potential benefit.  I have suggested we try oral pain management.  I have had some reasonable success using Cymbalta in patients with chronic shoulder pain after surgery.  We are going to try this today.  She may return to clinic on an as-needed basis      SUBJECTIVE:  Chief Complaint:   chronic bilateral shoulder pain    History of Present Illness:   82 y.o. female who presents for follow-up of chronic bilateral shoulder pain. Hx of L rTSA in 2019.  She was last seen in July of last year.  She continues to complain of lateral and posterior based left shoulder pain.  X-rays taken at that time showed no adverse features    Also endorses L arm pain that started 6 months ago.  Pain begins just below the level of the shoulder mid brachium and radiates distally.        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    Chronic kidney disease     Depression, psychotic (CMS-HCC)     Diabetes mellitus (CMS-HCC)     in past    Emphysema of lung (CMS-HCC)     Financial difficulties     Frail elderly     Hearing impairment     Hernia     History of transfusion     Hyperlipidemia     Hypertension     Impaired mobility     Osteoporosis     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/08/2015    Visual impairment     Surgical History  Past Surgical History: Procedure Laterality Date    ABDOMINAL SURGERY      BLADDER SURGERY      BREAST CYST EXCISION      CHEMOTHERAPY  2012    bladder    GALLBLADDER SURGERY      stone removal    ILEOSTOMY  2012    PR COLONOSCOPY FLX DX W/COLLJ SPEC WHEN PFRMD N/A 02/09/2015    Procedure: COLONOSCOPY, FLEXIBLE, PROXIMAL TO SPLENIC FLEXURE; DIAGNOSTIC, W/WO COLLECTION SPECIMEN BY BRUSH OR WASH;  Surgeon: Dewaine Conger, MD;  Location: [facility] [place];  Service: Gastroenterology    PR RECONSTR TOTAL SHOULDER IMPLANT Left 04/08/2018    Procedure: ARTHROPLASTY, GLENOHUMERAL JOINT; TOTAL SHOULDER(GLENOID & PROXIMAL HUMERAL REPLACEMENT(EG, TOTAL SHOULDER);  Surgeon: Tomasa Rand, MD;  Location: [facility];  Service: Ortho Sports Medicine    PR SIGMOIDOSCOPY,BIOPSY N/A 03/11/2015    Procedure: SIGMOIDOSCOPY, FLEXIBLE; WITH BIOPSY, SINGLE OR MULTIPLE;  Surgeon: Wilburt Finlay, MD;  [facility];  Service: Gastroenterology    PR XCAPSL  CTRC RMVL INSJ IO LENS PROSTH W/O ECP Left 06/05/2022    Procedure: EXTRACAPSULAR CATARACT REMOVAL W/INSERTION OF INTRAOCULAR LENS PROSTHESIS, MANUAL OR MECHANICAL TECHNIQUE WITHOUT ENDOSCOPIC CYCLOPHOTOCOAGULATION;  Surgeon: Garner Gavel, MD;  [facility];  Service: Ophthalmology    PR XCAPSL CTRC RMVL INSJ IO LENS PROSTH W/O ECP Right 06/19/2022    Procedure: EXTRACAPSULAR CATARACT REMOVAL W/INSERTION OF INTRAOCULAR LENS PROSTHESIS, MANUAL OR MECHANICAL TECHNIQUE WITHOUT ENDOSCOPIC CYCLOPHOTOCOAGULATION;  Surgeon: Garner Gavel, MD;  [facility] OR [facility];  Service: Ophthalmology    Medications  has a current medication list which includes the following prescription(s): albuterol, aspirin-acetaminophen-caffeine, benzonatate, candesartan, cyclobenzaprine, doxycycline, duloxetine, fluphenazine decanoate, furosemide, gabapentin, inhalational spacing device, MEDICAL SUPPLY ITEM, senna, tiotropium, tramadol, and trolamine salicylate, and the following Facility-Administered Medications: fluphenazine decanoate and fluphenazine decanoate.   Tobacco/Alcohol History  Social History     Tobacco Use   Smoking Status Every Day    Current packs/day: 1.00    Average packs/day: 1 pack/day for 65.6 years (65.6 ttl pk-yrs)    Types: Cigarettes    Start date: 04/09/1958   Smokeless Tobacco Never   Tobacco Comments    15cpd     Social History     Substance and Sexual Activity   Alcohol Use No    Alcohol/week: 0.0 standard drinks of alcohol    Social History        Occupational History    Not on file       Home Address:  [address] [place] [address] Family History  Family History   Problem Relation Age of Onset    Breast cancer Daughter 15    Diabetes Mother     Glaucoma Father     Colon cancer Neg Hx     Ovarian cancer Neg Hx     Endometrial cancer Neg Hx     Anesthesia problems Neg Hx     Bleeding Disorder Neg Hx         Allergies   Lisinopril, Losartan, and Hctz [hydrochlorothiazide]       Review of Systems  .   Marland Kitchen  No chest pain, dyspnea, nausea, fevers, chills       DETAILED PHYSICAL EXAM  General Appearance well-nourished and no acute distress   Mood and Affect alert, cooperative and pleasant   Cardiovascular well-perfused distally and no swelling   Sensation Sensation intact to light touch distally      Left    Shoulder   Inspection: No swelling, erythema, deformity, atrophy or hypertrophy.  Range of motion: FE 120, ER 60, IR testing deferred due to limitations, Abduction 70   Apprehension: n/a  Provocative Tests / Tenderness:  deferred         Test Results  Imaging    X-rays of the left shoulder today reveal a stable Grammont style reverse arthroplasty prosthesis in unchanged position.  There is some mild scapular notching noted inferiorly without compromise of the baseplate.      DME ORDER:  Dx:  ,              Patient Profile:  Practice:  established to provider    Plan:  continue conservative management

## 2023-10-29 NOTE — Unmapped (Signed)
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     Other Visit Diagnoses       Myelomalacia of cervical cord (CMS-HCC)    -  Primary    Relevant Orders    MRI cervical spine without contrast    Ambulatory referral to [facility]            Mri  Ref spine  Lots of discussion with family  Updated problem list annotations  Database review and update  Med review and update  Lab review and update  Counseling provided and patient education    40 minutes with patient- 1/2 in face to face counseling on chronic health conditions and  djd neck    ADDEDNDUM- PATIENT CALLED ME TWO DAYS LATER TO THANK ME- SAID HER PAIN IS MUCH BETTER WITH TRAMADOL    SUBJECTIVE:    Tammy Braun is a 82 y.o. female that presents to clinic today regarding the following issues:    Follow up  Complains of neck pain and hand weakness  Numbess too  Does not take meds regulalry  Given tramadol in past- helps but does not take  Gabapentin helps but sleepy  Takes good powders 5-8 a day  Smokes  Counseled   With daughter     reports that she has been smoking cigarettes. She started smoking about 65 years ago. She has a 65.6 pack-year smoking history. She has never used smokeless tobacco.    The following portions of the patient's history were reviewed and updated as appropriate: allergies, current medications, past family history, past medical history, past social history, past surgical history and problem list.    Review of systems for 10 organ systems conducted and no significant positives      OBJECTIVE:    Vital signs have been reviewed:   Vitals:    10/29/23 1142   BP: 124/85   Pulse: 70   Temp:

## 2023-11-10 MED ORDER — GLIPIZIDE 2.5 MG TABLET
ORAL_TABLET | 11 refills | 0 days | Status: CP
Start: 2023-11-10 — End: ?

## 2023-11-12 NOTE — Unmapped (Unsigned)
DIVISION OF CARDIOLOGY   [facility] [place] [facility]        Date of Service: 11/13/2023    Assessment/Plan     1. PVCs -sinus rhythm with occasional PVC's ***    2. Hypertension -her blood pressure today is ***. We will continue current medications including Lasix . Continue candesartan 32 mg. Advised patient to monitor BP at home and maintain a log. We will continue to monitor and if BP continues to be elevated, will consider re-addition of amlodipine.     3. Tobacco use disorder - currently smoking 1 pack a day, increased from 5 cigarettes a day. *** Complete cessation encouraged.     4. Moderate Tricuspid Regurgitation - currently asymptomatic without evidence of volume overload. *** We will continue to monitor.  - Echo from 04/10/23 shows normal LV function, moderate mitral annular calcification, mild MR, mild AR, moderate to severe TR, moderate pulmonary hypertension, and mild PR.    5. CAD *** - Chest CT from 06/06/23 notes heavy calcified atherosclerotic disease within the coronary arterial distribution.    Return to clinic:    No follow-ups on file.      Subjective:   ZOX:[id number], [name], MD     Chief complaint: f/u of PVCs    History of Present Illness:  Tammy Braun is a 82 y.o. female with a history of bladder cancer s/p bladder removal w/ ileal-conduit with urostomy, emphysema, hypertension, tobacco abuse and hiatal hernia who presents for routine f/up.      Tammy Braun established care after hospitalization at [facility]. She was admitted for significant hyponatremia in the setting of hydrochlorothiazide and poor p.o. intake. Was noted to have very frequent ectopy. Nuclear stress test negative for ischemia. Echo w/ normal LVSF, severe PH, mod-sev TR. Ziopatch results during hospital stay showed 19% PVCs that were symptomatic (she felt dizzy and lightheaded during that time). She had recurrence of episodes of lightheadedness/dizziness and EKG 09/21/20 showed recurrence of PVCs in bigeminy. Her daughter felt that it was due to patient taking Marlin Canary powders again for HA and smoking. She states that she took these away from her after hospitalization and thinks that made the PVCs initially improve.     Tammy Braun returns for follow-up today in clinic. She presented to the ED on 07/22/23 after a mechanical fall for dizziness and nausea.     She lives at home and continues to smoke.    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)    Schizoaffective disorder (CMS-HCC)    Onychomycosis of multiple toenails with type 2 diabetes mellitus and peripheral neuropathy (CMS-HCC)    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    At maximum risk for fall    Right hip pain    Tinea versicolor    Disorder of left rotator cuff    Spinal stenosis of cervical region    Left shoulder pain    Articular  cartilage disorder of shoulder region, left    Tinnitus    Bradycardia    Skin breakdown    Influenza vaccination declined    Chronic tension-type headache, not intractable    Osteoarthritis of cervical spine    Arthralgia of both ankles    Frequent PVCs    Carotid arterial disease (CMS-HCC) < 40% bilaterally 2021    Primary hypertension    Nonrheumatic tricuspid valve regurgitation    Pulmonary HTN (CMS-HCC)    Pyelitis    Age-related osteoporosis without current pathological fracture    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)    Arthritis of neck    History of bladder cancer    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    Antalgic gait       Medications:  Current Outpatient Medications   Medication Sig Dispense Refill    albuterol HFA 90 mcg/actuation inhaler Inhale 2 puffs every four (4) hours as needed for wheezing. 18 g 11    aspirin-acetaminophen-caffeine (EXCEDRIN MIGRAINE) 250-250-65 mg per tablet Take 1 tablet by mouth every six (6) hours as needed for pain.      benzonatate (TESSALON PERLES) 100 MG capsule Take 1 capsule (100 mg total) by mouth every six (6) hours as needed for cough. 30 capsule 1    candesartan (ATACAND) 16 MG tablet 1 po bid 60 tablet 11    cyclobenzaprine (FLEXERIL) 5 MG tablet Take 1 tablet (5 mg total) by mouth two (2) times a day as needed for muscle spasms. 60 tablet 5    doxycycline (VIBRA-TABS) 100 MG tablet 1 po q day for 10 days 10 tablet 1    DULoxetine (CYMBALTA) 30 MG capsule Take 1 capsule (30 mg total) by mouth daily. 30 capsule 11    fluPHENAZine decanoate (PROLIXIN) 25 mg/mL injection Inject 2 mL (50 mg total) into the muscle every thirty (30) days. 5 mL 11    furosemide (LASIX) 20 MG tablet TAKE 2 TABLETS BY MOUTH DAILY FOR 5 DAYS, THEN 1 TABLET DAILY 90 tablet 1    gabapentin (NEURONTIN) 100 MG capsule Take 1 capsule (100 mg total) by mouth Three (3) times a day. 90 capsule 2    glipiZIDE 2.5 mg Tab 1 PO q am for blood sugar. If blood sugar drops below 60, do not take. 30 tablet 11    inhalational spacing device (E-Z SPACER) Spcr 1 each by Miscellaneous route every four (4) hours as needed. 1 each 0    MEDICAL SUPPLY ITEM Entreal formula nutritionally supplement complete caloric density 3 application 3    senna (SENOKOT) 8.6 mg tablet Take 2 tablets by mouth nightly as needed for constipation. 60 tablet 0    tiotropium (SPIRIVA WITH HANDIHALER) 18 mcg inhalation capsule Place 1 capsule (18 mcg total) into inhaler and inhale daily as needed. 31 capsule 5    traMADol (ULTRAM) 50 mg tablet 1 po at bedtime prn pain 30 tablet 5    trolamine salicylate (ASPERCREME TOP) Apply 1 Application topically as needed (pain).       Current Facility-Administered Medications   Medication Dose Route Frequency Provider Last Rate Last Admin    fluPHENAZine decanoate (PROLIXIN) injection 50 mg  50 mg Intramuscular Q30 Days Lovett Calender, MD   50 mg at 10/21/23 1208    fluPHENAZine decanoate (PROLIXIN) injection 50 mg  50 mg Intramuscular Q30 Days Coralyn Helling, MD           Allergies  Allergies   Allergen  Reactions    Lisinopril Anaphylaxis and Swelling    Losartan Dizziness    Hctz [Hydrochlorothiazide]      SIADH       Social History:   Social History     Tobacco Use    Smoking status: Every Day     Current packs/day: 1.00     Average packs/day: 1 pack/day for 65.6 years (65.6 ttl pk-yrs)     Types: Cigarettes     Start date: 04/09/1958    Smokeless tobacco: Never    Tobacco comments:     15cpd   Vaping Use    Vaping status: Never Used   Substance Use Topics    Alcohol use: No     Alcohol/week: 0.0 standard drinks of alcohol    Drug use: No       Family History:  Family History   Problem Relation Age of Onset    Breast cancer Daughter 57    Diabetes Mother     Glaucoma Father     Colon cancer Neg Hx     Ovarian cancer Neg Hx     Endometrial cancer Neg Hx     Anesthesia problems Neg Hx     Bleeding Disorder Neg Hx        ROS- 12 system review is negative other than what is specified in the History of Present Illness.      Objective:   Physical Exam  There were no vitals filed for this visit.        Wt Readings from Last 3 Encounters:   10/29/23 60.8 kg (134 lb)   09/26/23 61.2 kg (135 lb)   09/11/23 61.2 kg (135 lb)     General-  Patient is chronically ill-appearing in no acute distress  Neurologic- Alert and oriented X3.  Cranial nerve II-XII grossly intact.  HEENT-  Normocephalic atraumatic head.  No scleral icterus.    Neck- Supple, no carotid bruis, no JVP  Lungs- clear to auscultation, no wheezes, rhonchi, or rhales.  Heart-  Bradycardic with HR 40, regular rhythm with occasional PVC's,  no murmur  Abdomen- Soft, nontender, no organomegally.  Extremities-  No clubbing or cyanosis. No LEE  Pulses- 2+pulses in radial and dorsalis pedis bilaterally.  Psych- Normal mood, appropriate.      Laboratory data:      Component Value Date/Time    PROBNP 338.0 07/26/2020 1604       Lab Results   Component Value Date    WBC 5.8 09/04/2023    HGB 13.0 09/04/2023    HCT 39.8 09/04/2023    PLT 208 09/04/2023       Lab Results   Component Value Date    NA 138 09/04/2023    K 4.3 09/04/2023    CL 108 (H) 09/04/2023    CO2 22.0 09/04/2023    BUN 22 09/04/2023    CREATININE 0.94 09/04/2023    GLU 100 09/04/2023    CALCIUM 9.7 09/04/2023    MG 2.1 05/13/2022    PHOS 3.2 05/13/2022       Lab Results   Component Value Date    TSH 1.898 06/22/2020    ALBUMIN 4.0 09/04/2023    ALT 8 (L) 09/04/2023    AST 13 09/04/2023    INR 1.1 08/02/2013       Lab Results   Component Value Date    LDL 88 11/02/2022    HDL 51 11/02/2022       Most Recent  Imaging:     LHC  No prior     Echo  04/10/23    1. The left ventricle is normal in size with mildly increased wall  thickness.    2. The left ventricular systolic function is normal, LVEF is visually  estimated at > 55%.    3. The mitral valve leaflets are mildly thickened with normal leaflet  mobility.    4. Mitral annular calcification is present (moderate).    5. There is mild mitral valve regurgitation.    6. The aortic valve is trileaflet with mildly thickened leaflets with normal  excursion.    7. There is mild aortic regurgitation.    8. The right ventricle is normal in size, with normal systolic function.    9. There is moderate to severe tricuspid regurgitation.    10. There is moderate pulmonary hypertension.    11. There is mild pulmonic regurgitation.  10/25/21    1. The left ventricle is normal in size with mildly increased wall  thickness.    2. The left ventricular systolic function is normal, LVEF is visually  estimated at > 55%.    3. The mitral valve leaflets are mildly thickened with normal leaflet  mobility.    4. There is mild mitral valve regurgitation.    5. The aortic valve is probably trileaflet with mildly thickened leaflets  with mildly reduced excursion.    6. There is mild aortic regurgitation.    7. The left atrium is mildly dilated in size.    8. There is moderate pulmonary hypertension.    9. The right ventricle is mildly dilated in size, with normal systolic  function.    10. There is moderate tricuspid regurgitation.    11. The right atrium is mildly dilated  in size.  06/27/20    1. The left ventricle is normal in size with mildly increased wall thickness.    2. The left ventricular systolic function is normal, LVEF is visually estimated at 60-65%.    3. The right ventricle is mildly dilated in size, with normal systolic function.    4. There is grade II diastolic dysfunction (elevated filling pressure).    5. The left atrium is mildly dilated in size.    6. The right atrium is mildly dilated  in size.    7. There is mild aortic regurgitation.    8. There is moderate to severe tricuspid regurgitation.    9. There is severe pulmonary hypertension, estimated pulmonary artery systolic pressure is 71 mmHg.    10. The aorta at the ascending aorta is mildly dilated.     CT Chest    06/06/23  1. As evident on prior imaging, there is extensive volume in the lung bases associated with mass effect from herniation of a large amount of abdominal contents into the caudal aspect of the mediastinum. However, this study demonstrates new complete lack of aeration of the lower lobe of the left lung which likely combination of atelectasis and consolidation. Associated extensive debris within the segmental airways may relate to infection, retained mucus, or aspiration. I favor the latter. No potentially associated obstructing mass lesion is identified. However, consider CT imaging surveillance in 2 to 3 months to ensure improvement.  2. Notable chronic findings include:  - Calcified atherosclerotic disease within the coronary arterial distribution (heavy)  - Advanced destructive centrilobular and substantial paraseptal emphysema, similar to prior.  -Prominent pulmonary pulmonary trunk (3. 7 cm), which may reflect some element  of pulmonary arterial hypertension  06/22/20  -- No evidence of intrathoracic mass as clinically questioned.  -- Dilated main pulmonary trunk measuring up to 3.9 cm which is nonspecific, however can be seen in the setting of pulmonary hypertension.  -- Grossly unchanged large hiatal hernia with associated adjacent atelectasis.  -- Additional chronic and incidental findings as described above.     NM Spect Mult / ETT 06/29/20  - No evidence for significant ischemia or scar is noted.  - Post stress: Global systolic function is normal. The ejection fraction was greater than 65%.  - Coronary and thoracic aorta calcifications as well as dilated main pulmonary trunk noted on CT, better characterized on recent chest CT scan dated 06/22/2020  - Large hiatal hernia noted, better characterized on recent CT abdomen 06/22/2020     Normal pharmacological stress ECG, nuclear images will be dictated separately.  No significant ST segment changes or arrhythmias were noted during stress  No significant chest pain symptoms reported     PVL Carotid Duplex 12/06/20  Right Carotid: There is evidence in the ICA of a less than 40% stenosis. Non-hemodynamically significant plaque noted in the CCA.  Left Carotid: There is evidence in the ICA of a less than 40% stenosis. Non-hemodynamically significant plaque noted in the CCA. Plaque noted in the ECA.  Vertebrals:  Both vertebral arteries were patent with antegrade flow.  Subclavians: Normal flow hemodynamics were seen in bilateral subclavian arteries.    ECG from 10/19/20 shows NSR, no PVCs      Dictated using Dragon software, please excuse typos.    Scribe Attestation:         This document serves as a record of the services and decisions performed by Molli Hazard A. Hessie Dibble, MD on 11/13/2023. It was created on his behalf by Fayette Pho, a trained medical scribe. The creation of this document is based on the provider's statements and observations that were conveyed to the medical scribe during the patient's encounter.     (The information in this document, created by the medical scribe for me, accurately reflects the services I personally performed and the decisions made by me. I have reviewed and approved this document for accuracy.)    I personally spent face-to-face and non-face-to-face in the care of this patient, which includes all pre, intra, and post visit time on the date of service.      Artist Pais Hessie Dibble, MD, MPH  Associate Professor of Medicine  [employer] [facility] [employer] [facility]  [email or web address]   -    Office [phone]

## 2023-11-13 ENCOUNTER — Ambulatory Visit: Admit: 2023-11-13 | Discharge: 2023-11-14 | Payer: MEDICARE

## 2023-11-13 DIAGNOSIS — I10 Essential (primary) hypertension: Principal | ICD-10-CM

## 2023-11-13 DIAGNOSIS — J432 Centrilobular emphysema: Principal | ICD-10-CM

## 2023-11-13 DIAGNOSIS — I361 Nonrheumatic tricuspid (valve) insufficiency: Principal | ICD-10-CM

## 2023-11-13 DIAGNOSIS — E1149 Type 2 diabetes mellitus with other diabetic neurological complication: Principal | ICD-10-CM

## 2023-11-13 DIAGNOSIS — I272 Pulmonary hypertension, unspecified: Principal | ICD-10-CM

## 2023-11-13 DIAGNOSIS — F17200 Nicotine dependence, unspecified, uncomplicated: Principal | ICD-10-CM

## 2023-11-13 MED ORDER — ATORVASTATIN 40 MG TABLET
ORAL_TABLET | Freq: Every day | ORAL | 3 refills | 90 days | Status: CP
Start: 2023-11-13 — End: 2024-11-12

## 2023-11-13 MED ORDER — LANCETS
11 refills | 0 days | Status: CP
Start: 2023-11-13 — End: 2024-11-12

## 2023-11-13 MED ORDER — AMLODIPINE 5 MG TABLET
ORAL_TABLET | Freq: Every day | ORAL | 11 refills | 30 days | Status: CP
Start: 2023-11-13 — End: 2024-11-12

## 2023-11-13 MED ORDER — ASPIRIN 81 MG TABLET,DELAYED RELEASE
ORAL_TABLET | Freq: Every day | ORAL | 11 refills | 30 days | Status: CP
Start: 2023-11-13 — End: 2024-11-12

## 2023-11-13 MED ORDER — BLOOD-GLUCOSE METER KIT WRAPPER
1 refills | 0 days | Status: CP
Start: 2023-11-13 — End: 2024-11-12

## 2023-11-13 MED ORDER — BLOOD GLUCOSE TEST STRIPS
ORAL_STRIP | 11 refills | 0 days | Status: CP
Start: 2023-11-13 — End: 2024-11-12

## 2023-11-13 NOTE — Unmapped (Signed)
Dr. Katrinka Blazing disregard the message I sent you about RUS Order patient is already schedule. Thanks

## 2023-11-13 NOTE — Unmapped (Signed)
Dr. Katrinka Blazing can you please put RUS order in system for patient yearly. Thanks

## 2023-11-14 DIAGNOSIS — I639 Cerebral infarction, unspecified: Principal | ICD-10-CM

## 2023-11-14 DIAGNOSIS — E1149 Type 2 diabetes mellitus with other diabetic neurological complication: Principal | ICD-10-CM

## 2023-11-14 NOTE — Unmapped (Signed)
[facility] FAM[facility] MEDICINE [place] POPULATION HEALTH  Care Management Progress Note    Date: 11/14/2023  Outcome:  Reached patient via telephone.    Purpose of contact:           SW contacted patient to introduce self as CCM care manager, assess for any needs, and provide direct contact information. Introduced self to patient directly, and patient passed the phone to her daughter, [name]. Followed up on past needs, and the patient's daughter reported that the patient was not able to get the powered wheelchair that was ordered last year (December 2023) and were unsure why.    Patient currently using a walker, which is also old (>5 years) and needs to be replaced. Patient is not able to go very far in the walker due to her limited mobility, so patient's daughter shared that the electric wheelchair is a priority.     Barriers to Care:  Financial insecurity: per chart 8/24 and Other psychosocial needs: stress, mental health concerns    Provider/Care Partner(s) to follow up on:   DME Request: wheelchair  Patient's family attempted to request powered/electric wheelchair last year through [employer], but never received it. Unable to locate orders and mobility evaluation from December 2023.     Additional Information/Plan:  Patient provided my direct contact information and encouraged to contact me should additional needs arise.    Time Spent Per Day:  Chart review was completed prior to outreach attempt.   11/14/2023: 20      Lavella Hammock, MSW  [employer] [facility] [place]

## 2023-11-18 ENCOUNTER — Institutional Professional Consult (permissible substitution): Admit: 2023-11-18 | Discharge: 2023-11-19 | Payer: MEDICARE

## 2023-11-18 DIAGNOSIS — F259 Schizoaffective disorder, unspecified: Principal | ICD-10-CM

## 2023-11-18 MED ADMIN — fluPHENAZine decanoate (PROLIXIN) injection 50 mg: 50 mg | INTRAMUSCULAR | @ 16:00:00 | Stop: 2024-10-10

## 2023-11-18 NOTE — Unmapped (Signed)
[facility] FAMILY MEDICINE [place] POPULATION HEALTH  Care Management Progress Note    Date: 11/18/2023  Outcome:  Phone outreach completed    Purpose of contact:           Spoke with case manager at [facility] regarding the patient's wheelchair, as the patient's wheelchair evaluation was performed with a [employer] rep. Per case manager's notes, on January 2nd 2024 the patient's wheelchair was delivered but patient's daughter declined the chair, stated wanted something different but according to [facility] that was the only option available Art gallery manager [employer]).    11/19 - SW contacted [employer], as this supplier was also mentioned in progress notes dated December 2023. SW left VM for [employer].  11/22 Sherron Monday with [employer] rep. Per rep, patient and family were offered a small power wheelchair Art gallery manager) but patient's family member had declined for unknown reasons. All paperwork was complete and wheelchair was ready at that time.    SW will follow up with family.    Barriers to Care:  Financial insecurity: noted in chart    Additional Information/Plan:  Patient provided my direct contact information and encouraged to contact me should additional needs arise.    Time Spent Per Day:  Chart review was completed prior to outreach attempt.   11/18/2023: 20    Lavella Hammock, MSW  [employer] [facility] [place]

## 2023-11-18 NOTE — Unmapped (Signed)
 Administered long-acting injectable today,. See MAR for additional documentation. Sharyl Nimrod, CMA

## 2023-11-20 NOTE — Unmapped (Signed)
Complex Case Management  SUMMARY NOTE    Attempted to contact pt today at Cell number to introduce Complex Case Management services. No answer, unable to leave message; 1st attempt    Discuss at next visit: Introduction to Complex Case Management    [facility] Management Assistant  [facility]  [address]  [place], [place] [address]  She/Her/Hers  P: [phone] F: [phone]  [email or web address]

## 2023-11-22 DIAGNOSIS — E1149 Type 2 diabetes mellitus with other diabetic neurological complication: Principal | ICD-10-CM

## 2023-11-22 DIAGNOSIS — I639 Cerebral infarction, unspecified: Principal | ICD-10-CM

## 2023-11-22 NOTE — Unmapped (Signed)
[facility] FAMILY MEDICINE [place] POPULATION HEALTH  Care Management Progress Note    Date: 11/22/2023  Outcome:  Phone contact completed    Purpose of contact:           SW reached out to patient's daughter, [name], to discuss power wheelchair issue. Per patient's daughter, they declined wheelchair stating that one option was too big and heavy,while the other option she felt would not hold up for use outside, [place] something stable that can roll over grass.     Per patient's daughter, the patient's mobility has decreased since her last mobility assessment in 2022 and family is not confident the patient will benefit from the prior recommended option. Discussed with patient's daughter a referral for an updated mobility evaluation in order to get a more updated wheelchair recommendation.    Provider/Care Partner(s) to follow up on:   Can you place a referral for a PT evaluation for PWC at the [facility] clinic? They have an option where you can click wheelchair evaluation and then put PWC evaluation in the comments.    Health Maintenance:  Health Maintenance Due   Topic Date Due    Medicare Annual Wellness Visit (AWV)  Never done    DTaP/Tdap/Td Vaccines (1 - Tdap) Never done    Zoster Vaccines (1 of 2) Never done    Lung Cancer Screening Shared Decision Making  Never done    Pneumococcal Vaccine 65+ (2 of 2 - PCV) 06/04/2018    COVID-19 Vaccine (3 - Pfizer risk series) 10/19/2020    Urine Albumin/Creatinine Ratio  12/14/2020    Retinal Eye Exam  07/19/2023    Influenza Vaccine (1) Never done       Additional Information/Plan:  Patient provided my direct contact information and encouraged to contact me should additional needs arise.    Time Spent Per Day:  Chart review was completed prior to outreach attempt.   11/22/2023: 10    Lavella Hammock, MSW  [facility] FAMILY MEDICINE [place]

## 2023-11-25 NOTE — Unmapped (Signed)
Complex Case Management  SUMMARY NOTE    Attempted to contact pt today at Cell number to introduce Complex Case Management services. Left message to return call.; 2nd attempt, letter sent.    Discuss at next visit: Introduction to Complex Case Management    [facility] Management Assistant  [facility]  [address]  [place], [place] [address]  She/Her/Hers  P: [phone] F: [phone]  [email or web address]

## 2023-11-26 MED ORDER — FLUPHENAZINE DECANOATE 25 MG/ML INJECTION SOLUTION
INTRAMUSCULAR | 11 refills | 60 days
Start: 2023-11-26 — End: ?

## 2023-11-27 MED ORDER — FLUPHENAZINE DECANOATE 25 MG/ML INJECTION SOLUTION
INTRAMUSCULAR | 11 refills | 60 days | Status: CP
Start: 2023-11-27 — End: ?
  Filled 2023-12-10: qty 5, 30d supply, fill #0

## 2023-11-27 NOTE — Unmapped (Signed)
[facility] Administered Medication Refill Coordination Note      NAME:Tammy Braun DOB: 04-05-1941      Medication: fluphenazine decanoate    Day Supply: 28 days      SHIPPING      Next delivery from [facility] [phone]) to [facility]ry Clinic for Tammy Braun is scheduled for 12/11.    Clinic contact: [name]     Patient's next nurse visit for administration: 12/16.    We will follow up with clinic monthly for standard refill processing and delivery.      Kapil Petropoulos Samella Parr  [facility]

## 2023-11-27 NOTE — Unmapped (Signed)
Left vmail on daughter's phone    Dr.Goldstein is requesting an appointment sooner than 12/11. If they are unable to move it up, that is fine.

## 2023-12-11 ENCOUNTER — Ambulatory Visit: Admit: 2023-12-11 | Discharge: 2023-12-12 | Payer: MEDICARE

## 2023-12-11 LAB — CBC W/ AUTO DIFF
BASOPHILS ABSOLUTE COUNT: 0 10*9/L (ref 0.0–0.1)
BASOPHILS RELATIVE PERCENT: 0.5 %
EOSINOPHILS ABSOLUTE COUNT: 0.3 10*9/L (ref 0.0–0.5)
EOSINOPHILS RELATIVE PERCENT: 3.1 %
HEMATOCRIT: 39.7 % (ref 34.0–44.0)
HEMOGLOBIN: 12.9 g/dL (ref 11.3–14.9)
LYMPHOCYTES ABSOLUTE COUNT: 1.2 10*9/L (ref 1.1–3.6)
LYMPHOCYTES RELATIVE PERCENT: 14.7 %
MEAN CORPUSCULAR HEMOGLOBIN CONC: 32.5 g/dL (ref 32.0–36.0)
MEAN CORPUSCULAR HEMOGLOBIN: 28.7 pg (ref 25.9–32.4)
MEAN CORPUSCULAR VOLUME: 88.5 fL (ref 77.6–95.7)
MEAN PLATELET VOLUME: 8.4 fL (ref 6.8–10.7)
MONOCYTES ABSOLUTE COUNT: 0.8 10*9/L (ref 0.3–0.8)
MONOCYTES RELATIVE PERCENT: 9.1 %
NEUTROPHILS ABSOLUTE COUNT: 6.1 10*9/L (ref 1.8–7.8)
NEUTROPHILS RELATIVE PERCENT: 72.6 %
PLATELET COUNT: 271 10*9/L (ref 150–450)
RED BLOOD CELL COUNT: 4.49 10*12/L (ref 3.95–5.13)
RED CELL DISTRIBUTION WIDTH: 15.8 % — ABNORMAL HIGH (ref 12.2–15.2)
WBC ADJUSTED: 8.3 10*9/L (ref 3.6–11.2)

## 2023-12-11 LAB — COMPREHENSIVE METABOLIC PANEL
ALBUMIN: 3.8 g/dL (ref 3.4–5.0)
ALKALINE PHOSPHATASE: 98 U/L (ref 46–116)
ALT (SGPT): <7 U/L — ABNORMAL LOW (ref 10–49)
ANION GAP: 10 mmol/L (ref 5–14)
AST (SGOT): 12 U/L (ref <=34)
BILIRUBIN TOTAL: 0.4 mg/dL (ref 0.3–1.2)
BLOOD UREA NITROGEN: 27 mg/dL — ABNORMAL HIGH (ref 9–23)
BUN / CREAT RATIO: 26
CALCIUM: 10.2 mg/dL (ref 8.7–10.4)
CHLORIDE: 103 mmol/L (ref 98–107)
CO2: 27 mmol/L (ref 20.0–31.0)
CREATININE: 1.05 mg/dL — ABNORMAL HIGH (ref 0.55–1.02)
EGFR CKD-EPI (2021) FEMALE: 54 mL/min/{1.73_m2} — ABNORMAL LOW (ref >=60)
GLUCOSE RANDOM: 119 mg/dL (ref 70–179)
POTASSIUM: 4.3 mmol/L (ref 3.4–4.8)
PROTEIN TOTAL: 8.1 g/dL (ref 5.7–8.2)
SODIUM: 140 mmol/L (ref 135–145)

## 2023-12-11 LAB — C-REACTIVE PROTEIN: C-REACTIVE PROTEIN: <4 mg/L (ref <=10.0)

## 2023-12-11 LAB — HEMOGLOBIN A1C
ESTIMATED AVERAGE GLUCOSE: 148 mg/dL
HEMOGLOBIN A1C: 6.8 % — ABNORMAL HIGH (ref 4.8–5.6)

## 2023-12-11 LAB — SEDIMENTATION RATE: ERYTHROCYTE SEDIMENTATION RATE: 53 mm/h — ABNORMAL HIGH (ref 0–30)

## 2023-12-11 NOTE — Unmapped (Signed)
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       Nervous and Auditory    Peripheral neuropathy       Musculoskeletal and Integument    Hip arthritis mild    Arthritis of neck       Other    Tobacco use disorder (Chronic)    Schizoaffective disorder (CMS-HCC) - Primary    Spinal stenosis of cervical region    Relevant Orders    CBC w/ Differential    Hemoglobin A1c    Comprehensive Metabolic Panel    C-reactive protein    Sedimentation Rate    Arthralgia of both ankles    History of bladder cancer     Other Visit Diagnoses         Myelomalacia of cervical cord (CMS-HCC)        Relevant Orders    CBC w/ Differential    Hemoglobin A1c    Comprehensive Metabolic Panel    C-reactive protein    Sedimentation Rate      Diabetes mellitus due to underlying condition with diabetic nephropathy, without long-term current use of insulin (CMS-HCC)        Relevant Orders    Hemoglobin A1c            Reassure  Consider med vertigo  May relate to CBL infarcts in past  Doubt relation to BP  Can check labs  Follow up after  Updated problem list annotations  Database review and update  Med review and update  Lab review and update  Counseling provided and patient education    40 minutes with patient- 1/2 in face to face counseling on chronic health conditions and  dizziness    SUBJECTIVE:    Tammy Braun is a 82 y.o. female that presents to clinic today regarding the following issues:    Some dizzy on going from laying down to standing  Does not last long  Litte 'woozy' feeliing  Complicated with 14 chronic med problems on prob list and above  BP fluctuate- gest upset with daughter (in room too)  BP goes up when upset  Normal otherwise  Cardiology started new med recently  She said it made her feel worse and she stopped it  PMH + CBL infarct since 2021 and old one too  Still smoking some despite extensive counseling    Hypoattenuation in the right cerebellum corresponding to an area of chronic infarction (3:10), new from 2021.      reports that she has been smoking cigarettes. She started smoking about 65 years ago. She has a 65.7 pack-year smoking history. She has never used smokeless tobacco.    The following portions of the patient's history were reviewed and updated as appropriate: allergies, current medications, past family history, past medical history, past social history, past surgical history and problem list.    Review of systems for  10 organ systems conducted and no significant positives      OBJECTIVE:    Vital signs have been reviewed:   Vitals:    12/11/23 1238   BP: 126/89   Pulse: 76   Temp:    SpO2: 98%     Vss  Lungs cta  Cv rsr  Bp good  Carotid neg

## 2023-12-12 NOTE — Unmapped (Signed)
Patient is requesting a phone call from Dr. Gwenlyn Fudge regarding the [facility] results as she has not heard back yet.

## 2023-12-13 NOTE — Unmapped (Signed)
[facility] SUBJECT FOR NEW MSG: Results Request Patient Name: Tammy Braun   Caller: Self (Patient)  Name of Caller: Cheyeanne Jarman  Contact Method: Telephone Call: Time- Any Time [phone]   Type of Results: Labs  Date of service test was completed: 12/11/23    The patient wishes to consult with her primary care physician about her laboratory results. She seeks to review the findings in order to gain a clearer understanding.

## 2023-12-16 ENCOUNTER — Institutional Professional Consult (permissible substitution): Admit: 2023-12-16 | Discharge: 2023-12-17 | Payer: MEDICARE

## 2023-12-16 DIAGNOSIS — R700 Elevated erythrocyte sedimentation rate: Principal | ICD-10-CM

## 2023-12-16 DIAGNOSIS — F258 Other schizoaffective disorders: Principal | ICD-10-CM

## 2023-12-16 MED ADMIN — fluPHENAZine decanoate (PROLIXIN) injection 50 mg: 50 mg | INTRAMUSCULAR | @ 17:00:00 | Stop: 2024-10-10

## 2023-12-16 NOTE — Unmapped (Signed)
 Administered long-acting injectable today. See MAR for additional documentation. Sharyl Nimrod, CMA

## 2023-12-17 NOTE — Unmapped (Signed)
[facility] SUBJECT FOR NEW MSG: Patient Advice Request Patient Name: Tammy Braun  Caller: Self (Patient)  Name of Caller: Avelino Leeds Method: Telephone Call: Time- Any Time [phone]   Reason for Call: Patient would like to discuss the result of blood work with Dr. Gwenlyn Fudge and see what steps need to be taken next.   Previously Discussed: yes  Appointment Offered: Declined  If offered accepted, scheduled appt date: NA

## 2023-12-17 NOTE — Unmapped (Signed)
Nurse spoke with pt over the phone. Nurse informed pt that as per PCP most labs are normal however informed pt that PCP is consulting with a specialist with regards to abnormal labs and informed pt that PCP would contact her. Pt verbalized understanding, requesting PCP to call her.

## 2023-12-17 NOTE — Unmapped (Signed)
Patient is requesting call back for message that Dr. Gwenlyn Fudge sent regarding test results. Patient is not on mychart and is needing a call. Call back #[phone]

## 2023-12-17 NOTE — Unmapped (Signed)
[facility] Ambulatory E-Consult Note         Thank you for your Ambulatory E-Consult.     To summarize: 82 yo female with chronic complex issues including DM, severe arthritis of spinal stenosis and neck and back, h/o cancer, COPD complaining of severe fatigue. Labs notable for elevated sed rate > 50. Clinical question does patient have PMR given elevated ESR and should be started on treatment as well as additional work-up.      My recommendations are as follows:   PMR is a clinical diagnosis based on careful clinical history and exam. The major presentation of PMR is proximal pain at deltoids and hip flexors where patient has limitation in range of movement due to pain. The proximal pain results in stiffness that is most prominent in the morning and gradually improves through the day. Patient typically report difficulty getting out of bed, off toilet, chair, car or combing/fixing hair or bra straps without assistance. Exam would be notable for active ROM limitation due to pain and less limitation with passive ROM. Patient would also have difficulty getting out of chair without using hands. While fatigue can be a component of PMR, the history and exam is hinged on the proximal pain and stiffness.   Of note, unable to determine if patient has PMR as this clinical history and exam are not reported or documented in encounter on 12/11/2023. If there is concern for PMR, recommend obtaining careful history and exam as noted above. If the history and exam is concerning for PMR, then management is low dose prednisone and there is now anti-IL6 therapy to minimize use of systemic steroids.   In regards to ESR sed rate, it has been elevated since 2016 and trending up over time.  ESR sed rate was 52 on 10/28/2020 and 53 as of 12/11/2023. There has been no significant change in the past three years. Moreover, CRP was <4.0 at both time points. Given this, ESR sed rate is not a useful marker by itself for this patient without documentation of history or exam for proximal pain and stiffness.    At this time, insufficient detail to determine if patient has PMR or not. See above for direction to determine.     I spent 30+ minutes in medical consultative verbal and/or internet discussion and review of medical records, including a written report to the treating provider via electronic health record regarding the condition of this patient.         This Ambulatory E-Consult did not include an answerable clinical question and did not recommend a clinic visit.    Alfredo Batty, MD      This Ambulatory E-Consult is based solely on the clinical information available to me in the patient's medical record and is provided without benefit of a comprehensive evaluation or physical examination of the patient. The information contained in this E-Consult must be interpreted in light of any clinical issues or changes in patient status that were not known to me at the time the E-Consult was completed. You must rely on your own informed clinical judgement for decision making. If necessary, refer the  patient for an in-office consultation. Please contact me if you have further questions.

## 2023-12-17 NOTE — Unmapped (Signed)
Patient called to receive an update regarding their results

## 2023-12-17 NOTE — Unmapped (Signed)
[facility] SUBJECT FOR NEW MSG: Results Request Patient Name: Tammy Braun   Caller: Self (Patient)  Name of Caller: Avelino Leeds Method: Telephone Call: Time- Any Time [phone]    Type of Results: Labs  Date of service test was completed: 12/11/2023

## 2023-12-18 NOTE — Unmapped (Signed)
Patient called to receive an update regarding their results

## 2023-12-18 NOTE — Unmapped (Signed)
[facility] SUBJECT FOR NEW MSG: Patient Advice Request Patient Name: Tammy Braun  Caller: Self (Patient)  Name of Caller: [name]  Contact Method: Telephone Call: Time- Any Time [phone]   Reason for Call: Patient asking for a call from PCP, has some questions regarding her lab work  Previously Discussed: yes  Appointment Offered: Declined  If offered accepted, scheduled appt date:

## 2023-12-25 ENCOUNTER — Other Ambulatory Visit: Payer: Self-pay

## 2023-12-25 ENCOUNTER — Emergency Department: Payer: Medicare Other

## 2023-12-25 ENCOUNTER — Inpatient Hospital Stay
Admission: EM | Admit: 2023-12-25 | Discharge: 2023-12-31 | DRG: 871 | Disposition: A | Discharge status: Home Health | Payer: Medicare Other | Attending: Obstetrics and Gynecology | Admitting: Obstetrics and Gynecology

## 2023-12-25 DIAGNOSIS — F17210 Nicotine dependence, cigarettes, uncomplicated: Secondary | ICD-10-CM | POA: Diagnosis present

## 2023-12-25 DIAGNOSIS — Z888 Allergy status to other drugs, medicaments and biological substances status: Secondary | ICD-10-CM

## 2023-12-25 DIAGNOSIS — J189 Pneumonia, unspecified organism: Secondary | ICD-10-CM | POA: Diagnosis present

## 2023-12-25 DIAGNOSIS — I2510 Atherosclerotic heart disease of native coronary artery without angina pectoris: Secondary | ICD-10-CM | POA: Diagnosis present

## 2023-12-25 DIAGNOSIS — N1831 Chronic kidney disease, stage 3a: Secondary | ICD-10-CM | POA: Diagnosis present

## 2023-12-25 DIAGNOSIS — Z7982 Long term (current) use of aspirin: Secondary | ICD-10-CM

## 2023-12-25 DIAGNOSIS — F259 Schizoaffective disorder, unspecified: Secondary | ICD-10-CM | POA: Diagnosis present

## 2023-12-25 DIAGNOSIS — E1122 Type 2 diabetes mellitus with diabetic chronic kidney disease: Secondary | ICD-10-CM | POA: Diagnosis present

## 2023-12-25 DIAGNOSIS — N179 Acute kidney failure, unspecified: Secondary | ICD-10-CM | POA: Diagnosis present

## 2023-12-25 DIAGNOSIS — Z7984 Long term (current) use of oral hypoglycemic drugs: Secondary | ICD-10-CM | POA: Diagnosis not present

## 2023-12-25 DIAGNOSIS — F333 Major depressive disorder, recurrent, severe with psychotic symptoms: Secondary | ICD-10-CM | POA: Diagnosis present

## 2023-12-25 DIAGNOSIS — Z906 Acquired absence of other parts of urinary tract: Secondary | ICD-10-CM

## 2023-12-25 DIAGNOSIS — Z8249 Family history of ischemic heart disease and other diseases of the circulatory system: Secondary | ICD-10-CM | POA: Diagnosis not present

## 2023-12-25 DIAGNOSIS — Z90710 Acquired absence of both cervix and uterus: Secondary | ICD-10-CM

## 2023-12-25 DIAGNOSIS — J440 Chronic obstructive pulmonary disease with acute lower respiratory infection: Secondary | ICD-10-CM | POA: Diagnosis present

## 2023-12-25 DIAGNOSIS — E1129 Type 2 diabetes mellitus with other diabetic kidney complication: Secondary | ICD-10-CM | POA: Diagnosis present

## 2023-12-25 DIAGNOSIS — B9620 Unspecified Escherichia coli [E. coli] as the cause of diseases classified elsewhere: Secondary | ICD-10-CM | POA: Diagnosis present

## 2023-12-25 DIAGNOSIS — I2489 Other forms of acute ischemic heart disease: Secondary | ICD-10-CM | POA: Diagnosis present

## 2023-12-25 DIAGNOSIS — I272 Pulmonary hypertension, unspecified: Secondary | ICD-10-CM | POA: Diagnosis present

## 2023-12-25 DIAGNOSIS — I959 Hypotension, unspecified: Secondary | ICD-10-CM | POA: Diagnosis present

## 2023-12-25 DIAGNOSIS — J432 Centrilobular emphysema: Secondary | ICD-10-CM | POA: Diagnosis not present

## 2023-12-25 DIAGNOSIS — R6520 Severe sepsis without septic shock: Principal | ICD-10-CM | POA: Diagnosis present

## 2023-12-25 DIAGNOSIS — N390 Urinary tract infection, site not specified: Secondary | ICD-10-CM | POA: Diagnosis present

## 2023-12-25 DIAGNOSIS — I129 Hypertensive chronic kidney disease with stage 1 through stage 4 chronic kidney disease, or unspecified chronic kidney disease: Secondary | ICD-10-CM | POA: Diagnosis present

## 2023-12-25 DIAGNOSIS — I4892 Unspecified atrial flutter: Secondary | ICD-10-CM | POA: Diagnosis present

## 2023-12-25 DIAGNOSIS — Z1152 Encounter for screening for COVID-19: Secondary | ICD-10-CM | POA: Diagnosis not present

## 2023-12-25 DIAGNOSIS — R0902 Hypoxemia: Secondary | ICD-10-CM | POA: Diagnosis present

## 2023-12-25 DIAGNOSIS — Z79899 Other long term (current) drug therapy: Secondary | ICD-10-CM | POA: Diagnosis not present

## 2023-12-25 DIAGNOSIS — Z833 Family history of diabetes mellitus: Secondary | ICD-10-CM

## 2023-12-25 DIAGNOSIS — N289 Disorder of kidney and ureter, unspecified: Secondary | ICD-10-CM | POA: Diagnosis not present

## 2023-12-25 DIAGNOSIS — J439 Emphysema, unspecified: Secondary | ICD-10-CM | POA: Diagnosis present

## 2023-12-25 DIAGNOSIS — I5A Non-ischemic myocardial injury (non-traumatic): Secondary | ICD-10-CM

## 2023-12-25 DIAGNOSIS — I10 Essential (primary) hypertension: Secondary | ICD-10-CM | POA: Diagnosis not present

## 2023-12-25 DIAGNOSIS — A419 Sepsis, unspecified organism: Principal | ICD-10-CM | POA: Diagnosis present

## 2023-12-25 DIAGNOSIS — J441 Chronic obstructive pulmonary disease with (acute) exacerbation: Secondary | ICD-10-CM | POA: Diagnosis present

## 2023-12-25 DIAGNOSIS — K449 Diaphragmatic hernia without obstruction or gangrene: Secondary | ICD-10-CM | POA: Diagnosis present

## 2023-12-25 DIAGNOSIS — Z936 Other artificial openings of urinary tract status: Secondary | ICD-10-CM | POA: Diagnosis not present

## 2023-12-25 DIAGNOSIS — I214 Non-ST elevation (NSTEMI) myocardial infarction: Secondary | ICD-10-CM | POA: Diagnosis present

## 2023-12-25 DIAGNOSIS — I4891 Unspecified atrial fibrillation: Secondary | ICD-10-CM | POA: Diagnosis present

## 2023-12-25 DIAGNOSIS — J9601 Acute respiratory failure with hypoxia: Secondary | ICD-10-CM | POA: Diagnosis not present

## 2023-12-25 DIAGNOSIS — Z794 Long term (current) use of insulin: Secondary | ICD-10-CM | POA: Diagnosis not present

## 2023-12-25 DIAGNOSIS — Z8551 Personal history of malignant neoplasm of bladder: Secondary | ICD-10-CM

## 2023-12-25 LAB — CBC WITH DIFFERENTIAL/PLATELET
Abs Immature Granulocytes: 1.3 10*3/uL — ABNORMAL HIGH (ref 0.00–0.07)
Basophils Absolute: 0.1 10*3/uL (ref 0.0–0.1)
Basophils Relative: 0 %
Eosinophils Absolute: 0 10*3/uL (ref 0.0–0.5)
Eosinophils Relative: 0 %
HCT: 37.3 % (ref 36.0–46.0)
Hemoglobin: 12 g/dL (ref 12.0–15.0)
Immature Granulocytes: 5 %
Lymphocytes Relative: 3 %
Lymphs Abs: 0.8 10*3/uL (ref 0.7–4.0)
MCH: 28.9 pg (ref 26.0–34.0)
MCHC: 32.2 g/dL (ref 30.0–36.0)
MCV: 89.9 fL (ref 80.0–100.0)
Monocytes Absolute: 1.1 10*3/uL — ABNORMAL HIGH (ref 0.1–1.0)
Monocytes Relative: 4 %
Neutro Abs: 24.7 10*3/uL — ABNORMAL HIGH (ref 1.7–7.7)
Neutrophils Relative %: 88 %
Platelets: 216 10*3/uL (ref 150–400)
RBC: 4.15 MIL/uL (ref 3.87–5.11)
RDW: 16 % — ABNORMAL HIGH (ref 11.5–15.5)
WBC: 27.9 10*3/uL — ABNORMAL HIGH (ref 4.0–10.5)
nRBC: 0 % (ref 0.0–0.2)

## 2023-12-25 LAB — CBG MONITORING, ED: Glucose-Capillary: 200 mg/dL — ABNORMAL HIGH (ref 70–99)

## 2023-12-25 LAB — PROCALCITONIN: Procalcitonin: 14.97 ng/mL

## 2023-12-25 LAB — RESP PANEL BY RT-PCR (RSV, FLU A&B, COVID)  RVPGX2
Influenza A by PCR: NEGATIVE
Influenza B by PCR: NEGATIVE
Resp Syncytial Virus by PCR: NEGATIVE
SARS Coronavirus 2 by RT PCR: NEGATIVE

## 2023-12-25 LAB — COMPREHENSIVE METABOLIC PANEL
ALT: 10 U/L (ref 0–44)
AST: 29 U/L (ref 15–41)
Albumin: 3.3 g/dL — ABNORMAL LOW (ref 3.5–5.0)
Alkaline Phosphatase: 69 U/L (ref 38–126)
Anion gap: 16 — ABNORMAL HIGH (ref 5–15)
BUN: 56 mg/dL — ABNORMAL HIGH (ref 8–23)
CO2: 17 mmol/L — ABNORMAL LOW (ref 22–32)
Calcium: 9.7 mg/dL (ref 8.9–10.3)
Chloride: 100 mmol/L (ref 98–111)
Creatinine, Ser: 2.36 mg/dL — ABNORMAL HIGH (ref 0.44–1.00)
GFR, Estimated: 20 mL/min — ABNORMAL LOW (ref >60)
Glucose, Bld: 201 mg/dL — ABNORMAL HIGH (ref 70–99)
Potassium: 5.9 mmol/L — ABNORMAL HIGH (ref 3.5–5.1)
Sodium: 133 mmol/L — ABNORMAL LOW (ref 135–145)
Total Bilirubin: 1.3 mg/dL — ABNORMAL HIGH (ref <1.2)
Total Protein: 7.3 g/dL (ref 6.5–8.1)

## 2023-12-25 LAB — URINALYSIS, W/ REFLEX TO CULTURE (INFECTION SUSPECTED)
Bilirubin Urine: NEGATIVE
Glucose, UA: NEGATIVE mg/dL
Ketones, ur: NEGATIVE mg/dL
Nitrite: NEGATIVE
Protein, ur: 30 mg/dL — AB
Specific Gravity, Urine: 1.013 (ref 1.005–1.030)
pH: 6 (ref 5.0–8.0)

## 2023-12-25 LAB — TROPONIN I (HIGH SENSITIVITY)
Troponin I (High Sensitivity): 85 ng/L — ABNORMAL HIGH (ref <18)
Troponin I (High Sensitivity): 118 ng/L (ref <18)
Troponin I (High Sensitivity): 195 ng/L (ref <18)

## 2023-12-25 LAB — LACTIC ACID, PLASMA
Lactic Acid, Venous: 2.8 mmol/L (ref 0.5–1.9)
Lactic Acid, Venous: 2.3 mmol/L (ref 0.5–1.9)
Lactic Acid, Venous: 1.7 mmol/L (ref 0.5–1.9)

## 2023-12-25 LAB — T4, FREE: Free T4: 1.09 ng/dL (ref 0.61–1.12)

## 2023-12-25 LAB — POTASSIUM: Potassium: 4.5 mmol/L (ref 3.5–5.1)

## 2023-12-25 LAB — TSH: TSH: 3.259 u[IU]/mL (ref 0.350–4.500)

## 2023-12-25 LAB — LIPASE, BLOOD: Lipase: 18 U/L (ref 11–51)

## 2023-12-25 MED ORDER — HYDROCORTISONE SOD SUC (PF) 100 MG IJ SOLR
100.0000 mg | Freq: Once | INTRAMUSCULAR | Status: DC
  Filled 2023-12-25: qty 2

## 2023-12-25 MED ORDER — INSULIN ASPART 100 UNIT/ML IJ SOLN
0.0000 [IU] | Freq: Every day | INTRAMUSCULAR | Status: DC
Start: 2023-12-25 — End: 2023-12-28

## 2023-12-25 MED ORDER — ATORVASTATIN CALCIUM 20 MG PO TABS
40.0000 mg | ORAL_TABLET | Freq: Every day | ORAL | Status: DC
  Administered 2023-12-28: 40 mg via ORAL
  Filled 2023-12-25 (×4): qty 2

## 2023-12-25 MED ORDER — GABAPENTIN 100 MG PO CAPS
100.0000 mg | ORAL_CAPSULE | Freq: Two times a day (BID) | ORAL | Status: DC
  Administered 2023-12-26 (×2): 100 mg via ORAL
  Filled 2023-12-25 (×2): qty 1

## 2023-12-25 MED ORDER — ONDANSETRON HCL 4 MG/2ML IJ SOLN: 4.0000 mg | Freq: Three times a day (TID) | INTRAMUSCULAR | Status: DC | PRN

## 2023-12-25 MED ORDER — SODIUM CHLORIDE 0.9 % IV SOLN: INTRAVENOUS | Status: DC

## 2023-12-25 MED ORDER — MIDODRINE HCL 5 MG PO TABS: 10.0000 mg | ORAL_TABLET | Freq: Three times a day (TID) | ORAL | Status: DC

## 2023-12-25 MED ORDER — INSULIN ASPART 100 UNIT/ML IJ SOLN
0.0000 [IU] | Freq: Three times a day (TID) | INTRAMUSCULAR | Status: DC
Start: 2023-12-26 — End: 2023-12-28
  Administered 2023-12-26 – 2023-12-27 (×2): 2 [IU] via SUBCUTANEOUS
  Administered 2023-12-27: 1 [IU] via SUBCUTANEOUS
  Administered 2023-12-27: 2 [IU] via SUBCUTANEOUS
  Administered 2023-12-28: 1 [IU] via SUBCUTANEOUS
  Filled 2023-12-25 (×5): qty 1

## 2023-12-25 MED ORDER — ACETAMINOPHEN 325 MG PO TABS: 650.0000 mg | ORAL_TABLET | Freq: Four times a day (QID) | ORAL | Status: DC | PRN

## 2023-12-25 MED ORDER — ASPIRIN 325 MG PO TBEC
325.0000 mg | DELAYED_RELEASE_TABLET | Freq: Every day | ORAL | Status: DC
  Administered 2023-12-25 – 2023-12-31 (×7): 325 mg via ORAL
  Filled 2023-12-25 (×7): qty 1

## 2023-12-25 MED ORDER — METRONIDAZOLE 500 MG/100ML IV SOLN
500.0000 mg | Freq: Once | INTRAVENOUS | Status: AC
Start: 2023-12-25 — End: 2023-12-25
  Administered 2023-12-25: 500 mg via INTRAVENOUS
  Filled 2023-12-25: qty 100

## 2023-12-25 MED ORDER — DM-GUAIFENESIN ER 30-600 MG PO TB12
1.0000 | ORAL_TABLET | Freq: Two times a day (BID) | ORAL | Status: DC | PRN
  Administered 2023-12-26: 1 via ORAL
  Filled 2023-12-25 (×3): qty 1

## 2023-12-25 MED ORDER — HEPARIN (PORCINE) 25000 UT/250ML-% IV SOLN
1250.0000 [IU]/h | INTRAVENOUS | Status: DC
  Administered 2023-12-26: 850 [IU]/h via INTRAVENOUS
  Administered 2023-12-26: 1050 [IU]/h via INTRAVENOUS
  Filled 2023-12-25 (×3): qty 250

## 2023-12-25 MED ORDER — TOPIRAMATE 25 MG PO TABS
25.0000 mg | ORAL_TABLET | Freq: Every day | ORAL | Status: DC
  Administered 2023-12-27 – 2023-12-28 (×2): 25 mg via ORAL
  Filled 2023-12-25 (×5): qty 1

## 2023-12-25 MED ORDER — HEPARIN BOLUS VIA INFUSION
3600.0000 [IU] | Freq: Once | INTRAVENOUS | Status: AC
  Administered 2023-12-26: 3600 [IU] via INTRAVENOUS
  Filled 2023-12-25: qty 3600

## 2023-12-25 MED ORDER — ALBUTEROL SULFATE (2.5 MG/3ML) 0.083% IN NEBU
2.5000 mg | INHALATION_SOLUTION | RESPIRATORY_TRACT | Status: DC | PRN
  Administered 2023-12-26 – 2023-12-29 (×5): 2.5 mg via RESPIRATORY_TRACT
  Filled 2023-12-25 (×5): qty 3

## 2023-12-25 MED ORDER — NICOTINE 21 MG/24HR TD PT24
21.0000 mg | MEDICATED_PATCH | Freq: Every day | TRANSDERMAL | Status: DC
  Filled 2023-12-25 (×3): qty 1

## 2023-12-25 MED ORDER — LACTATED RINGERS IV BOLUS
1000.0000 mL | Freq: Once | INTRAVENOUS | Status: AC
  Administered 2023-12-25: 1000 mL via INTRAVENOUS

## 2023-12-25 MED ORDER — SODIUM CHLORIDE 0.9 % IV SOLN
2.0000 g | Freq: Once | INTRAVENOUS | Status: AC
  Administered 2023-12-25: 2 g via INTRAVENOUS
  Filled 2023-12-25: qty 12.5

## 2023-12-25 MED ORDER — LACTATED RINGERS IV BOLUS (SEPSIS)
1000.0000 mL | Freq: Once | INTRAVENOUS | Status: AC
  Administered 2023-12-25: 1000 mL via INTRAVENOUS

## 2023-12-25 MED ORDER — VANCOMYCIN HCL IN DEXTROSE 1-5 GM/200ML-% IV SOLN
1000.0000 mg | Freq: Once | INTRAVENOUS | Status: AC
  Administered 2023-12-25: 1000 mg via INTRAVENOUS
  Filled 2023-12-25: qty 200

## 2023-12-25 MED ORDER — DOXYCYCLINE HYCLATE 100 MG IV SOLR
100.0000 mg | Freq: Two times a day (BID) | INTRAVENOUS | Status: AC
  Administered 2023-12-26 (×3): 100 mg via INTRAVENOUS
  Filled 2023-12-25 (×3): qty 100

## 2023-12-25 MED ORDER — SODIUM CHLORIDE 0.9 % IV SOLN
1.0000 g | INTRAVENOUS | Status: DC
  Administered 2023-12-26: 1 g via INTRAVENOUS
  Filled 2023-12-25: qty 10

## 2023-12-25 NOTE — H&P (Signed)
History and Physical    Brittany Hernandez XNA:[id number] DOB: 05-24-1941 DOA: 12/25/2023  Referring MD/NP/PA:   PCP: Evette Cristal, MD   Patient coming from:  The patient is coming from home.     Chief Complaint: weakness, cough  HPI: Brittany Hernandez is a 82 y.o. female with medical history significant of bladder cancer (s/p bladder removal w/ ileal-conduit with urostomy), HTN, DM, COPD, CAD, schizophrenia, depression with psychosis, CKD-3A, UTI, PVC, tobacco abuse, who presents with weakness and cough.  Patient states that she has feeling weak in the past several days, which has been progressively worsening.  No unilateral numbness or tingling in extremities.  She feels "whoozy".  She has cough with brownish colored sputum production.  Has mild SOB, no chest pain.  No fever or chills.  She also reports lower abdominal pain, which is constant, mild, nonradiating.  No nausea vomiting.  She has urostomy, therefore no symptoms of UTI.  No recent fall or head injury.  No dark stool or rectal bleeding.  Patient was initially hypotensive with blood pressure 77/66, which improved return 138/81 after giving 3 L of LR bolus in ED.  Oxygen desaturated to 89% on room air, which improved to 93-100% on 2 L oxygen.  Data reviewed independently and ED Course: pt was found to have negative PCR for COVID, flu and RSV, WBC 27.9, lactic acid 2.8 --> 2.3, troponin level 85 --> 118, positive urinalysis (cloudy appearance, moderate amount leukocyte, many bacteria, WBC 21-50), worsening renal function, potassium 4.8.  Patient is admitted to PCU as inpatient.  Consulted Dr. Mariah Milling of card.  CXR: 1. 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 2. Developing small pleural effusions with basilar atelectasis or infiltration.   CT-abdomen/pelvis Large hiatal hernia containing stomach, transverse colon and small bowel loops.    Left lower lobe airspace disease concerning for pneumonia.   Aortic atherosclerosis, coronary artery disease.   Multiple low-density lesions in the kidneys, most of which appear to reflect cysts. Indeterminate right upper pole and left midpole lesions noted which are not definitively cystic. These could be further evaluated with elective renal ultrasound.   EKG: I have personally reviewed.  Seem to be in A-fib vs. Atrial flutter with variable A-V block, QTc 427, low voltage   Review of Systems:   General: no fevers, chills, no body weight gain, has poor appetite, has fatigue HEENT: no blurry vision, hearing changes or sore throat Respiratory: has dyspnea, coughing, no wheezing CV: no chest pain, no palpitations GI: no nausea, vomiting, has lower abdominal pain, no diarrhea, constipation GU: no dysuria, burning on urination, increased urinary frequency, hematuria  Ext: no leg edema Neuro: no unilateral weakness, numbness, or tingling, no vision change or hearing loss Skin: no rash, no skin tear. MSK: No muscle spasm, no deformity, no limitation of range of movement in spin Heme: No easy bruising.  Travel history: No recent long distant travel.   Allergy:  Allergies  Allergen Reactions   Lisinopril Swelling    Past Medical History:  Diagnosis Date   Bladder cancer (HCC)    s/p cystectomy   Diabetes mellitus without complication (HCC)    Hypertension     Past Surgical History:  Procedure Laterality Date   ABDOMINAL HYSTERECTOMY     CHOLECYSTECTOMY     CYSTECTOMY W/ CONTINENT DIVERSION Right    URETEROSTOMY Right     Social History:  reports that she has been smoking cigarettes.  She has a 50 pack-year smoking history. She does not have any smokeless tobacco history on file. She reports that she does not drink alcohol and does not use drugs.  Family History:  Family History  Problem Relation Age of Onset   Diabetes Mellitus II Other    CAD Other      Prior to  Admission medications   Medication Sig Start Date End Date Taking? Authorizing Provider  acetaminophen (TYLENOL) 500 MG tablet Take 1,000 mg by mouth every 8 (eight) hours as needed for pain. 11/13/17   [provider]  albuterol (PROVENTIL HFA;VENTOLIN HFA) 108 (90 Base) MCG/ACT inhaler Inhale 2 puffs into the lungs every 4 (four) hours as needed for wheezing. 08/11/18   [provider]  fluPHENAZine decanoate (PROLIXIN) 25 MG/ML injection  04/13/15   [provider]  furosemide (LASIX) 20 MG tablet Take 20 mg by mouth daily.    [provider]  gabapentin (NEURONTIN) 100 MG capsule 1 capsule today and then beginning tomorrow take 1 capsule twice a day. Patient taking differently: Take 100 mg by mouth 2 (two) times daily.  08/04/18   Bridget Hartshorn L, PA-C  lidocaine (LIDODERM) 5 % Place 1 patch onto the skin daily. Remove & Discard patch within 24 hours or as directed by MD 07/20/20 07/20/21  Joni Reining, PA-C  losartan (COZAAR) 100 MG tablet Take 100 mg by mouth daily.    [provider]  meclizine (ANTIVERT) 12.5 MG tablet Take 1 tablet (12.5 mg total) by mouth 2 (two) times daily as needed for dizziness. 05/08/15   Gale Journey, MD  metFORMIN (GLUCOPHAGE) 500 MG tablet Take 500 mg by mouth daily. 06/13/18   [provider]  topiramate (TOPAMAX) 25 MG tablet Take 25 mg by mouth Nightly. 11/25/18   [provider]    Physical Exam: Vitals:   12/25/23 2200 12/25/23 2230 12/25/23 2300 12/25/23 2330  BP: 119/75 95/78  (!) 108/45  Pulse: 78 72  72  Resp: (!) 23 (!) 26  (!) 21  Temp:      TempSrc:      SpO2: 94% 94%  94%  Weight:   62 kg   Height:   5\' 1"  (1.549 m)    General: Not in acute distress HEENT:       Eyes: PERRL, EOMI, no jaundice       ENT: No discharge from the ears and nose, no pharynx injection, no tonsillar enlargement.        Neck: No JVD, no bruit, no mass felt. Heme: No neck lymph node  enlargement. Cardiac: S1/S2, irregularly irregular rhythm, no gallops or rubs. Respiratory: No rales, wheezing, rhonchi or rubs. GI: Soft, nondistended, has mild tenderness in lower abdomen, no rebound pain, no organomegaly, BS present. S/p of urostomy GU: No hematuria Ext: No pitting leg edema bilaterally. 1+DP/PT pulse bilaterally. Musculoskeletal: No joint deformities, No joint redness or warmth, no limitation of ROM in spin. Skin: No rashes.  Neuro: Alert, oriented X3, cranial nerves II-XII grossly intact, moves all extremities normally.  Psych: Patient is not psychotic, no suicidal or hemocidal ideation.  Labs on Admission: I have personally reviewed following labs and imaging studies  CBC: Recent Labs  Lab 12/25/23 1625  WBC 27.9*  NEUTROABS 24.7*  HGB 12.0  HCT 37.3  MCV 89.9  PLT 216   Basic Metabolic Panel: Recent Labs  Lab 12/25/23 1625 12/25/23 2057  NA 133*  --   K 5.9* 4.5  CL 100  --   CO2 17*  --   GLUCOSE 201*  --   BUN 56*  --   CREATININE 2.36*  --   CALCIUM 9.7  --    GFR: Estimated Creatinine Clearance: 16.1 mL/min (A) (by C-G formula based on SCr of 2.36 mg/dL (H)). Liver Function Tests: Recent Labs  Lab 12/25/23 1625  AST 29  ALT 10  ALKPHOS 69  BILITOT 1.3*  PROT 7.3  ALBUMIN 3.3*   Recent Labs  Lab 12/25/23 1625  LIPASE 18   No results for input(s): "AMMONIA" in the last 168 hours. Coagulation Profile: No results for input(s): "INR", "PROTIME" in the last 168 hours. Cardiac Enzymes: No results for input(s): "CKTOTAL", "CKMB", "CKMBINDEX", "TROPONINI" in the last 168 hours. BNP (last 3 results) No results for input(s): "PROBNP" in the last 8760 hours. HbA1C: No results for input(s): "HGBA1C" in the last 72 hours. CBG: Recent Labs  Lab 12/25/23 2222  GLUCAP 200*   Lipid Profile: No results for input(s): "CHOL", "HDL", "LDLCALC", "TRIG", "CHOLHDL", "LDLDIRECT" in the last 72 hours. Thyroid Function Tests: Recent Labs     12/25/23 2057  TSH 3.259  FREET4 1.09   Anemia Panel: No results for input(s): "VITAMINB12", "FOLATE", "FERRITIN", "TIBC", "IRON", "RETICCTPCT" in the last 72 hours. Urine analysis:    Component Value Date/Time   COLORURINE YELLOW (A) 12/25/2023 1625   APPEARANCEUR CLOUDY (A) 12/25/2023 1625   LABSPEC 1.013 12/25/2023 1625   PHURINE 6.0 12/25/2023 1625   GLUCOSEU NEGATIVE 12/25/2023 1625   HGBUR SMALL (A) 12/25/2023 1625   BILIRUBINUR NEGATIVE 12/25/2023 1625   KETONESUR NEGATIVE 12/25/2023 1625   PROTEINUR 30 (A) 12/25/2023 1625   NITRITE NEGATIVE 12/25/2023 1625   LEUKOCYTESUR MODERATE (A) 12/25/2023 1625   Sepsis Labs: @LABRCNTIP (procalcitonin:4,lacticidven:4) ) Recent Results (from the past 240 hours)  Resp panel by RT-PCR (RSV, Flu A&B, Covid) Anterior Nasal Swab     Status: None   Collection Time: 12/25/23  4:25 PM   Specimen: Anterior Nasal Swab  Result Value Ref Range Status   SARS Coronavirus 2 by RT PCR NEGATIVE NEGATIVE Final    Comment: (NOTE) SARS-CoV-2 target nucleic acids are NOT DETECTED.  The SARS-CoV-2 RNA is generally detectable in upper respiratory specimens during the acute phase of infection. The lowest concentration of SARS-CoV-2 viral copies this assay can detect is 138 copies/mL. A negative result does not preclude SARS-Cov-2 infection and should not be used as the sole basis for treatment or other patient management decisions. A negative result may occur with  improper specimen collection/handling, submission of specimen other than nasopharyngeal swab, presence of viral mutation(s) within the areas targeted by this assay, and inadequate number of viral copies(<138 copies/mL). A negative result must be combined with clinical observations, patient history, and epidemiological information. The expected result is Negative.  Fact Sheet for Patients:  BloggerCourse.com  Fact Sheet for Healthcare Providers:   SeriousBroker.it  This test is no t yet approved or cleared by the [place] FDA and  has been authorized for detection and/or diagnosis of SARS-CoV-2 by FDA under an Emergency Use Authorization (EUA). This EUA will remain  in effect (meaning this test can be used) for the duration of the COVID-19 declaration under Section 564(b)(1) of the Act, 21 U.S.C.section 360bbb-3(b)(1), unless the authorization is terminated  or revoked sooner.       Influenza A by PCR NEGATIVE NEGATIVE Final   Influenza B by PCR NEGATIVE NEGATIVE Final    Comment: (NOTE) The  Xpert Xpress SARS-CoV-2/FLU/RSV plus assay is intended as an aid in the diagnosis of influenza from Nasopharyngeal swab specimens and should not be used as a sole basis for treatment. Nasal washings and aspirates are unacceptable for Xpert Xpress SARS-CoV-2/FLU/RSV testing.  Fact Sheet for Patients: BloggerCourse.com  Fact Sheet for Healthcare Providers: SeriousBroker.it  This test is not yet approved or cleared by the [place] FDA and has been authorized for detection and/or diagnosis of SARS-CoV-2 by FDA under an Emergency Use Authorization (EUA). This EUA will remain in effect (meaning this test can be used) for the duration of the COVID-19 declaration under Section 564(b)(1) of the Act, 21 U.S.C. section 360bbb-3(b)(1), unless the authorization is terminated or revoked.     Resp Syncytial Virus by PCR NEGATIVE NEGATIVE Final    Comment: (NOTE) Fact Sheet for Patients: BloggerCourse.com  Fact Sheet for Healthcare Providers: SeriousBroker.it  This test is not yet approved or cleared by the [place] FDA and has been authorized for detection and/or diagnosis of SARS-CoV-2 by FDA under an Emergency Use Authorization (EUA). This EUA will remain in effect (meaning this test can be used) for  the duration of the COVID-19 declaration under Section 564(b)(1) of the Act, 21 U.S.C. section 360bbb-3(b)(1), unless the authorization is terminated or revoked.  Performed at [facility], [address] [place], [place] [address]      Radiological Exams on Admission: CT ABDOMEN PELVIS WO CONTRAST Result Date: 12/25/2023 CLINICAL DATA:  Abdominal pain, acute, nonlocalized EXAM: CT ABDOMEN AND PELVIS WITHOUT CONTRAST TECHNIQUE: Multidetector CT imaging of the abdomen and pelvis was performed following the standard protocol without IV contrast. RADIATION DOSE REDUCTION: This exam was performed according to the departmental dose-optimization program which includes automated exposure control, adjustment of the mA and/or kV according to patient size and/or use of iterative reconstruction technique. COMPARISON:  None Available. FINDINGS: Lower chest: There is a large hiatal hernia containing stomach as well as transverse colon and small bowel loops. Much of the hernia is in the right lower chest. Airspace disease in the left lower lobe concerning for pneumonia. No effusions. Coronary artery and aortic atherosclerosis. Hepatobiliary: No focal liver abnormality is seen. Status post cholecystectomy. No biliary dilatation. Pancreas: No focal abnormality or ductal dilatation. Spleen: No focal abnormality.  Normal size. Adrenals/Urinary Tract: Adrenal glands normal. Bilateral renal low-density lesions, many of which appear to reflect cysts. Indeterminate lesion in the upper pole of the right kidney measures up to 2.9 cm. 1.7 cm indeterminate lesion in the midpole of the left kidney. No hydronephrosis. Urinary bladder not visualized with surgical clips in the pelvis, likely prior cystectomy. Right lower quadrant urostomy noted. Stomach/Bowel: No bowel obstruction or inflammatory process. Sigmoid diverticulosis. Vascular/Lymphatic: Aortoiliac atherosclerosis. No evidence of aneurysm or adenopathy.  Reproductive: Prior hysterectomy.  No adnexal masses. Other: No free fluid or free air. Musculoskeletal: No acute bony abnormality. IMPRESSION: Large hiatal hernia containing stomach, transverse colon and small bowel loops. Left lower lobe airspace disease concerning for pneumonia. Aortic atherosclerosis, coronary artery disease. Multiple low-density lesions in the kidneys, most of which appear to reflect cysts. Indeterminate right upper pole and left midpole lesions noted which are not definitively cystic. These could be further evaluated with elective renal ultrasound. Electronically Signed   By: Charlett Nose M.D.   On: 12/25/2023 19:38   DG Chest Port 1 View Result Date: 12/25/2023 CLINICAL DATA:  Question of sepsis to evaluate for abnormality. Shortness of breath, dark urine, low oxygen saturation. History of COPD.  EXAM: PORTABLE CHEST 1 VIEW COMPARISON:  06/16/2020 FINDINGS: Shallow inspiration with elevation of right hemidiaphragm versus right diaphragmatic hernia. 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 This is unchanged since prior study. Suggestion of small developing pleural effusions with basilar atelectasis or infiltration. Possible pneumonia versus compressive atelectasis. No pneumothorax. Heart size and pulmonary vascularity are normal for technique. Calcification of the aorta. Degenerative changes in the right shoulder. Postoperative change in the left shoulder. IMPRESSION: 1. 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 2. Developing small pleural effusions with basilar atelectasis or infiltration. Electronically Signed   By: Burman Nieves M.D.   On: 12/25/2023 17:13      Assessment/Plan Principal Problem:   Severe sepsis (HCC) Active Problems:   CAP (community acquired pneumonia)   UTI (urinary tract infection)   NSTEMI (non-ST elevated myocardial infarction)  (HCC)   CAD (coronary artery disease)   New onset atrial fibrillation (HCC)   HTN (hypertension)   COPD (chronic obstructive pulmonary disease) with emphysema (HCC)   Type II diabetes mellitus with renal manifestations (HCC)   Acute renal failure superimposed on stage 3a chronic kidney disease (HCC)   Severe recurrent depression with psychosis (HCC)   Schizoaffective disorder (HCC)   Kidney lesion   Assessment and Plan:  Severe sepsis [facility]): Patient has severe sepsis with WBC 27.9, heart rate up to 136, RR 25.  Lactic acid 2.8 --> 2.3.  This is likely due to combination of CAP and UTI.  Patient was initially hypotensive with blood pressure 77/66, which improved return 138/81 after giving 3 L of LR bolus in ED.  -will admit to PCU as inpt -Antibiotics: Rocephin and doxycycline (patient received 1 dose of vancomycin, cefepime and Flagyl in ED) -IV fluid: 3 L LR, then 75 cc/h of normal saline  CAP (community acquired pneumonia): Has 2 L new oxygen requirement. - On antibiotics as above - Incentive spirometry - Mucinex for cough  - Bronchodilators - Urine legionella and S. pneumococcal antigen - Follow up blood culture x2, sputum culture - will get Procalcitonin and trend lactic acid level per sepsis protocol - IVF: IV fluid as above  UTI (urinary tract infection) -Follow-up urine culture -IV fluids above  NSTEMI (non-ST elevated myocardial infarction) (HCC) and hx of CAD: trop  85 --> 118 --> 195. -started IV heparin -started ASA and lipitor 40 mg daily -trend trop -Check FLP (pt had A2c 6.8 on 12/11/23) -consulted Dr. Mariah Milling of card  New onset atrial fibrillation [facility]): initial HR 136 --> 70s now. CHADS2 score is 6.  -started IV heparin -check TSH, free T4/T3  HTN (hypertension) -Hold Cozaar and Lasix due to hypotension -Monitor blood pressure closely  COPD (chronic obstructive pulmonary disease) with emphysema (HCC) -Bronchodilators and Mucinex  Type II diabetes  mellitus with renal manifestations (HCC): Recent A1c 6.8, well-controlled.  Patient is taking metformin -Slight scale insulin  Acute renal failure superimposed on stage 3a chronic kidney disease (HCC): Likely multifactorial etiology, including UTI, continuation of Cozaar and Lasix, ATN is also possible due to hypotension.  Baseline creatinine 0.05 on 812/11/24.  Her creatinine is 2.36, BUN 56, GFR 20. -IV fluid as above -Hold Lasix and Cozaar  Severe recurrent depression with psychosis (HCC) and Schizoaffective disorder (HCC): pt is calm now -Continue home medications: Toprol mild -Patient is on Protonix injection  Kidney lesion: CT showed multiple low-density lesions in the kidneys, most of which appear to reflect cysts. Indeterminate right upper  pole and left midpole lesions noted which are not definitively cystic.  -need to f/u with PCP or urology for outpt work up.     DVT ppx: on IV Heparin    Code Status: Full code    Family Communication:   Yes, patient's daughter   at bed side.  Disposition Plan:  Anticipate discharge back to previous environment  Consults called: Dr. Mariah Milling of card  Admission status and Level of care: Progressive:   as inpt       Dispo: The patient is from: Home              Anticipated d/c is to: Home              Anticipated d/c date is: 2 days              Patient currently is not medically stable to d/c.    Severity of Illness:  The appropriate patient status for this patient is INPATIENT. Inpatient status is judged to be reasonable and necessary in order to provide the required intensity of service to ensure the patient's safety. The patient's presenting symptoms, physical exam findings, and initial radiographic and laboratory data in the context of their chronic comorbidities is felt to place them at high risk for further clinical deterioration. Furthermore, it is not anticipated that the patient will be medically stable for discharge from the  hospital within 2 midnights of admission.   * I certify that at the point of admission it is my clinical judgment that the patient will require inpatient hospital care spanning beyond 2 midnights from the point of admission due to high intensity of service, high risk for further deterioration and high frequency of surveillance required.*       Date of Service 12/26/2023    Lorretta Harp [facility]   If 7PM-7AM, please contact night-coverage [email or web address] 12/26/2023, 12:03 AM

## 2023-12-25 NOTE — Sepsis Progress Note (Signed)
Code Sepsis protocol being monitored by eLink. 

## 2023-12-25 NOTE — Progress Notes (Signed)
ANTICOAGULATION CONSULT NOTE  Pharmacy Consult for heparin infusion Indication: NSTEI and new A fib   Allergies  Allergen Reactions   Lisinopril Swelling    Patient Measurements: Height: 5\' 1"  (154.9 cm) Weight: 62 kg (136 lb 11 oz) (From O/V note on 12/11/23) IBW/kg (Calculated) : 47.8 Heparin Dosing Weight: 60.4 kg  Vital Signs: Temp: 98.6 F (37 C) (12/25 1620) Temp Source: Oral (12/25 1620) BP: 95/78 (12/25 2230) Pulse Rate: 72 (12/25 2230)  Labs: Recent Labs    12/25/23 1625 12/25/23 1837 12/25/23 2057  HGB 12.0  --   --   HCT 37.3  --   --   PLT 216  --   --   CREATININE 2.36*  --   --   TROPONINIHS 85* 118* 195*    Estimated Creatinine Clearance: 16.1 mL/min (A) (by C-G formula based on SCr of 2.36 mg/dL (H)).   Medical History: Past Medical History:  Diagnosis Date   Bladder cancer [facility])    s/p cystectomy   Diabetes mellitus without complication (HCC)    Hypertension     Assessment: Pt is a 82 yo female presenting to ED c/o "feeling off"  found with elevated troponin I level, trending up, and new onset A. Fib.  Goal of Therapy:  Heparin level 0.3-0.7 units/ml Monitor platelets by anticoagulation protocol: Yes   Plan:  Bolus 3600 units x 1 Start heparin infusion at 850 units/hr Will check HL in 8 hr after start of infusion CBC daily while on heparin  Otelia Sergeant, PharmD, [facility] 12/25/2023 11:12 PM

## 2023-12-25 NOTE — ED Triage Notes (Signed)
Per EMS- pt c/o "feeling off" since yesterday. Daughter reported SHOB, dark urine, and pt reported she "hasn't been drinking enough water." Pt states she feels "whoozy." Pt AOX4, NAD noted. Pt reports LUQ abdominal pain. Strength equal bilaterally.  Oxygen was 89-90% on RA  98% on 2 liter's [place] 80/50--> 116/60  History of COPD

## 2023-12-25 NOTE — ED Notes (Signed)
Pt coughing up small amount of brown mucus.

## 2023-12-25 NOTE — ED Notes (Signed)
MD notified of lactic of 2.8 at this time.

## 2023-12-25 NOTE — Consult Note (Signed)
ED Pharmacy Antibiotic Sign Off An antibiotic consult was received from an ED provider for vancomycin and cefepime per pharmacy dosing for sepsis. A chart review was completed to assess appropriateness.   The following one time order(s) were placed:  Cefepime 2 grams IV x 1 and vancomycin 1 gram IV x 1  Further antibiotic and/or antibiotic pharmacy consults should be ordered by the admitting provider if indicated.   Thank you for allowing pharmacy to be a part of this patient's care.   Barrie Folk, [facility]  Clinical Pharmacist 12/25/23 6:36 PM

## 2023-12-25 NOTE — ED Notes (Signed)
Family refusing to take Atorvastatin.. stating "her cholesterol is fine" would like to discuss with provider before starting medication or at least check her cholesterol.

## 2023-12-25 NOTE — ED Provider Notes (Signed)
[facility] Provider Note    Event Date/Time   First MD Initiated Contact with Patient 12/25/23 1620     (approximate)   History   Abdominal Pain   HPI  Brittany Hernandez is a 82 y.o. female with a history of hypertension, diabetes, schizoaffective disorder, and bladder cancer status post cystectomy who presents with generalized weakness for at least the last several days associated with some abdominal pain.  The patient states that she has been feeling "woozy" for about 2 weeks but it got worse in the last few days.  She is also having some upper abdominal pain mainly on the left that started today.  She denies any vomiting or diarrhea.  She does report feeling somewhat short of breath and EMS found that she was hypoxic to the high 80s on room air.  The family reported that the patient had not been acting herself for the last few days.  They also noted to EMS that she has been having dark urine.  I reviewed the past medical records.  The patient was most recently admitted to the hospital service in 2016 with urinary tract infection and altered mental status.  Her recent outpatient encounter was with family medicine at [facility] on 12/11 for follow-up of her chronic conditions although she did report feeling "woozy" at that time and reported fluctuations in her blood pressure.   Physical Exam   Triage Vital Signs: ED Triage Vitals [12/25/23 1620]  Encounter Vitals Group     BP      Systolic BP Percentile      Diastolic BP Percentile      Pulse      Resp      Temp      Temp src      SpO2      Weight      Height      Head Circumference      Peak Flow      Pain Score 9     Pain Loc      Pain Education      Exclude from Growth Chart     Most recent vital signs: Vitals:   12/25/23 1930 12/25/23 2000  BP: (!) 105/58 90/63  Pulse: 76 77  Resp: (!) 22 (!) 24  Temp:    SpO2: 100% 93%     General: Alert, oriented x 4, no distress.  CV:  Good peripheral  perfusion.  Resp:  Normal effort.  Bilateral coarse rales, no wheezes. Abd:  No distention.  Soft with mild epigastric tenderness.  Colostomy site intact with no erythema, induration, or tenderness. Other:  Slightly dry mucous membranes.  No significant peripheral edema.  EOMI.  PERRLA.  Normal speech.  No facial droop.  Motor intact in all extremities.   ED Results / Procedures / Treatments   Labs (all labs ordered are listed, but only abnormal results are displayed) Labs Reviewed  LACTIC ACID, PLASMA - Abnormal; Notable for the following components:      Result Value   Lactic Acid, Venous 2.8 (*)    All other components within normal limits  LACTIC ACID, PLASMA - Abnormal; Notable for the following components:   Lactic Acid, Venous 2.3 (*)    All other components within normal limits  COMPREHENSIVE METABOLIC PANEL - Abnormal; Notable for the following components:   Sodium 133 (*)    Potassium 5.9 (*)    CO2 17 (*)    Glucose, Bld 201 (*)  BUN 56 (*)    Creatinine, Ser 2.36 (*)    Albumin 3.3 (*)    Total Bilirubin 1.3 (*)    GFR, Estimated 20 (*)    Anion gap 16 (*)    All other components within normal limits  CBC WITH DIFFERENTIAL/PLATELET - Abnormal; Notable for the following components:   WBC 27.9 (*)    RDW 16.0 (*)    Neutro Abs 24.7 (*)    Monocytes Absolute 1.1 (*)    Abs Immature Granulocytes 1.30 (*)    All other components within normal limits  URINALYSIS, W/ REFLEX TO CULTURE (INFECTION SUSPECTED) - Abnormal; Notable for the following components:   Color, Urine YELLOW (*)    APPearance CLOUDY (*)    Hgb urine dipstick SMALL (*)    Protein, ur 30 (*)    Leukocytes,Ua MODERATE (*)    Bacteria, UA MANY (*)    All other components within normal limits  TROPONIN I (HIGH SENSITIVITY) - Abnormal; Notable for the following components:   Troponin I (High Sensitivity) 85 (*)    All other components within normal limits  TROPONIN I (HIGH SENSITIVITY) - Abnormal;  Notable for the following components:   Troponin I (High Sensitivity) 118 (*)    All other components within normal limits  RESP PANEL BY RT-PCR (RSV, FLU A&B, COVID)  RVPGX2  CULTURE, BLOOD (ROUTINE X 2)  CULTURE, BLOOD (ROUTINE X 2)  URINE CULTURE  LIPASE, BLOOD  POTASSIUM  CORTISOL     EKG  ED ECG REPORT I, Dionne Bucy, the attending physician, personally viewed and interpreted this ECG.  Date: 12/25/2023 EKG Time: 1705 Rate: 129 Rhythm: Atrial flutter QRS Axis: Right axis Intervals: normal ST/T Wave abnormalities: Nonspecific ST abnormalities Narrative Interpretation: no evidence of acute ischemia    RADIOLOGY  Chest x-ray: I independently viewed and interpreted the images; there are small pleural effusions with no focal consolidation or edema  CT abdomen/pelvis:  IMPRESSION:  Large hiatal hernia containing stomach, transverse colon and small  bowel loops.    Left lower lobe airspace disease concerning for pneumonia.    Aortic atherosclerosis, coronary artery disease.    Multiple low-density lesions in the kidneys, most of which appear to  reflect cysts. Indeterminate right upper pole and left midpole  lesions noted which are not definitively cystic. These could be  further evaluated with elective renal ultrasound.    PROCEDURES:  Critical Care performed: Yes, see critical care procedure note(s)  .Critical Care  Performed by: Dionne Bucy, MD Authorized by: Dionne Bucy, MD   Critical care provider statement:    Critical care time (minutes):  35   Critical care time was exclusive of:  Separately billable procedures and treating other patients   Critical care was necessary to treat or prevent imminent or life-threatening deterioration of the following conditions:  Sepsis   Critical care was time spent personally by me on the following activities:  Development of treatment plan with patient or surrogate, discussions with consultants,  evaluation of patient's response to treatment, examination of patient, ordering and review of laboratory studies, ordering and review of radiographic studies, ordering and performing treatments and interventions, pulse oximetry, re-evaluation of patient's condition, review of old charts and obtaining history from patient or surrogate   Care discussed with: admitting provider      MEDICATIONS ORDERED IN ED: Medications  midodrine (PROAMATINE) tablet 10 mg (has no administration in time range)  hydrocortisone sodium succinate (SOLU-CORTEF) 100 MG injection 100 mg (has  no administration in time range)  0.9 %  sodium chloride infusion (has no administration in time range)  albuterol (PROVENTIL) (2.5 MG/3ML) 0.083% nebulizer solution 2.5 mg (has no administration in time range)  dextromethorphan-guaiFENesin (MUCINEX DM) 30-600 MG per 12 hr tablet 1 tablet (has no administration in time range)  ondansetron (ZOFRAN) injection 4 mg (has no administration in time range)  acetaminophen (TYLENOL) tablet 650 mg (has no administration in time range)  nicotine (NICODERM CQ - dosed in mg/24 hours) patch 21 mg (has no administration in time range)  lactated ringers bolus 1,000 mL (0 mLs Intravenous Stopped 12/25/23 1803)  lactated ringers bolus 1,000 mL (0 mLs Intravenous Stopped 12/25/23 2004)  ceFEPIme (MAXIPIME) 2 g in sodium chloride 0.9 % 100 mL IVPB (0 g Intravenous Stopped 12/25/23 1833)  metroNIDAZOLE (FLAGYL) IVPB 500 mg (0 mg Intravenous Stopped 12/25/23 1935)  vancomycin (VANCOCIN) IVPB 1000 mg/200 mL premix (1,000 mg Intravenous New Bag/Given 12/25/23 2007)  lactated ringers bolus 1,000 mL (1,000 mLs Intravenous New Bag/Given 12/25/23 2042)     IMPRESSION / MDM / ASSESSMENT AND PLAN / ED COURSE  I reviewed the triage vital signs and the nursing notes.  82 year old female with PMH as noted above presents with weakness and possible dizziness over the last several days associated with dark urine,  some shortness of breath, and not acting herself per the family.  On exam the patient is alert and oriented but somewhat weak appearing.  She has borderline low blood pressure and was noted to be hypotensive by EMS shortly after standing.  The patient is tachycardic to the 130s but other vital signs are normal. Abdomen soft with mild epigastric discomfort.  Neurologic exam is nonfocal.  Differential diagnosis includes, but is not limited to, COVID-19, influenza, other viral syndrome, UTI, pneumonia, or other infection, gastroenteritis, gastritis, or other acute intra-abdominal process, dehydration, electrolyte abnormality, AKI, other metabolic etiology, less likely primary cardiac cause.  There are no neuro deficits to suggest CNS etiology.  We will obtain lab workup, chest x-ray, CT abdomen/pelvis, give fluids, and reassess.  Patient's presentation is most consistent with acute presentation with potential threat to life or bodily function.  The patient is on the cardiac monitor to evaluate for evidence of arrhythmia and/or significant heart rate changes.  ----------------------------------------- 9:25 PM on 12/25/2023 -----------------------------------------  Initial lab workup revealed WBC count of 27 and elevated lactate.  Given the tachycardia and low blood pressure, presentation is consistent with sepsis.  I ordered fluids per the sepsis protocol and empiric broad-spectrum antibiotics for sepsis with unknown source.  Subsequent lab workup is significant for troponin of 118 which is consistent with demand ischemia.  CMP shows AKI with creatinine of 2.3.  Potassium is slightly elevated as well but is hemolyzed.  Urinalysis shows findings compatible with a UTI.  Chest x-ray was nonspecific but CT shows likely left lower lobe infiltrate.  The patient's heart rate improved markedly with fluids.  Blood pressure remains borderline low.  However the patient remains alert and comfortable appearing.  I  counseled the patient and family members on the results of the workup and plan of care.  I then consulted Dr. Clyde Lundborg from the hospitalist service; based on our discussion he agrees to evaluate the patient for admission.   FINAL CLINICAL IMPRESSION(S) / ED DIAGNOSES   Final diagnoses:  Sepsis with acute organ dysfunction, due to unspecified organism, unspecified organ dysfunction type, unspecified whether septic shock present (HCC)  Urinary tract infection without hematuria, site unspecified  Rx / DC Orders   ED Discharge Orders     None        Note:  This document was prepared using Dragon voice recognition software and may include unintentional dictation errors.    Dionne Bucy, MD 12/25/23 2128

## 2023-12-25 NOTE — Consult Note (Signed)
CODE SEPSIS - PHARMACY COMMUNICATION  **Broad Spectrum Antibiotics should be administered within 1 hour of Sepsis diagnosis**  Time Code Sepsis Called/Page Received: 1751  Antibiotics Ordered: cefepime, vancomycin, Flagyl  Time of 1st antibiotic administration: 1803  Additional action taken by pharmacy: N/A   Barrie Folk ,PharmD Clinical Pharmacist  12/25/2023  6:34 PM

## 2023-12-25 NOTE — ED Notes (Signed)
Unable to draw blood from IV- lab called to collect timed troponin and lactic acid.

## 2023-12-26 DIAGNOSIS — I4891 Unspecified atrial fibrillation: Secondary | ICD-10-CM | POA: Diagnosis not present

## 2023-12-26 DIAGNOSIS — J432 Centrilobular emphysema: Secondary | ICD-10-CM

## 2023-12-26 DIAGNOSIS — Z794 Long term (current) use of insulin: Secondary | ICD-10-CM

## 2023-12-26 DIAGNOSIS — A419 Sepsis, unspecified organism: Secondary | ICD-10-CM | POA: Diagnosis not present

## 2023-12-26 DIAGNOSIS — E1122 Type 2 diabetes mellitus with diabetic chronic kidney disease: Secondary | ICD-10-CM

## 2023-12-26 DIAGNOSIS — N390 Urinary tract infection, site not specified: Secondary | ICD-10-CM | POA: Diagnosis not present

## 2023-12-26 DIAGNOSIS — F259 Schizoaffective disorder, unspecified: Secondary | ICD-10-CM

## 2023-12-26 DIAGNOSIS — J189 Pneumonia, unspecified organism: Secondary | ICD-10-CM | POA: Diagnosis not present

## 2023-12-26 LAB — CBC
HCT: 31.1 % — ABNORMAL LOW (ref 36.0–46.0)
Hemoglobin: 9.9 g/dL — ABNORMAL LOW (ref 12.0–15.0)
MCH: 28.9 pg (ref 26.0–34.0)
MCHC: 31.8 g/dL (ref 30.0–36.0)
MCV: 90.9 fL (ref 80.0–100.0)
Platelets: 213 10*3/uL (ref 150–400)
RBC: 3.42 MIL/uL — ABNORMAL LOW (ref 3.87–5.11)
RDW: 16.2 % — ABNORMAL HIGH (ref 11.5–15.5)
WBC: 22.9 10*3/uL — ABNORMAL HIGH (ref 4.0–10.5)
nRBC: 0 % (ref 0.0–0.2)

## 2023-12-26 LAB — TROPONIN I (HIGH SENSITIVITY)
Troponin I (High Sensitivity): 202 ng/L (ref <18)
Troponin I (High Sensitivity): 172 ng/L (ref <18)

## 2023-12-26 LAB — BLOOD GAS, VENOUS
Acid-base deficit: 5.4 mmol/L — ABNORMAL HIGH (ref 0.0–2.0)
Bicarbonate: 20.6 mmol/L (ref 20.0–28.0)
O2 Saturation: 66.6 %
Patient temperature: 37
pCO2, Ven: 41 mm[Hg] — ABNORMAL LOW (ref 44–60)
pH, Ven: 7.31 (ref 7.25–7.43)
pO2, Ven: 39 mm[Hg] (ref 32–45)

## 2023-12-26 LAB — LIPID PANEL
Cholesterol: 110 mg/dL (ref 0–200)
HDL: 39 mg/dL — ABNORMAL LOW (ref >40)
LDL Cholesterol: 52 mg/dL (ref 0–99)
Total CHOL/HDL Ratio: 2.8 {ratio}
Triglycerides: 97 mg/dL (ref <150)
VLDL: 19 mg/dL (ref 0–40)

## 2023-12-26 LAB — CBG MONITORING, ED
Glucose-Capillary: 111 mg/dL — ABNORMAL HIGH (ref 70–99)
Glucose-Capillary: 184 mg/dL — ABNORMAL HIGH (ref 70–99)
Glucose-Capillary: 93 mg/dL (ref 70–99)
Glucose-Capillary: 158 mg/dL — ABNORMAL HIGH (ref 70–99)

## 2023-12-26 LAB — BASIC METABOLIC PANEL
Anion gap: 10 (ref 5–15)
BUN: 51 mg/dL — ABNORMAL HIGH (ref 8–23)
CO2: 21 mmol/L — ABNORMAL LOW (ref 22–32)
Calcium: 8.7 mg/dL — ABNORMAL LOW (ref 8.9–10.3)
Chloride: 103 mmol/L (ref 98–111)
Creatinine, Ser: 1.84 mg/dL — ABNORMAL HIGH (ref 0.44–1.00)
GFR, Estimated: 27 mL/min — ABNORMAL LOW (ref >60)
Glucose, Bld: 145 mg/dL — ABNORMAL HIGH (ref 70–99)
Potassium: 4 mmol/L (ref 3.5–5.1)
Sodium: 134 mmol/L — ABNORMAL LOW (ref 135–145)

## 2023-12-26 LAB — EXPECTORATED SPUTUM ASSESSMENT W GRAM STAIN, RFLX TO RESP C

## 2023-12-26 LAB — APTT: aPTT: 42 s — ABNORMAL HIGH (ref 24–36)

## 2023-12-26 LAB — PROTIME-INR
INR: 1.3 — ABNORMAL HIGH (ref 0.8–1.2)
Prothrombin Time: 16.8 s — ABNORMAL HIGH (ref 11.4–15.2)

## 2023-12-26 LAB — STREP PNEUMONIAE URINARY ANTIGEN: Strep Pneumo Urinary Antigen: NEGATIVE

## 2023-12-26 LAB — MAGNESIUM: Magnesium: 1.8 mg/dL (ref 1.7–2.4)

## 2023-12-26 LAB — HEPARIN LEVEL (UNFRACTIONATED)
Heparin Unfractionated: 0.18 [IU]/mL — ABNORMAL LOW (ref 0.30–0.70)
Heparin Unfractionated: 0.32 [IU]/mL (ref 0.30–0.70)

## 2023-12-26 MED ORDER — BISOPROLOL FUMARATE 5 MG PO TABS
2.5000 mg | ORAL_TABLET | Freq: Every day | ORAL | Status: DC
  Administered 2023-12-26: 2.5 mg via ORAL
  Filled 2023-12-26 (×2): qty 0.5

## 2023-12-26 MED ORDER — HEPARIN BOLUS VIA INFUSION
1800.0000 [IU] | Freq: Once | INTRAVENOUS | Status: AC
  Administered 2023-12-26: 1800 [IU] via INTRAVENOUS
  Filled 2023-12-26: qty 1800

## 2023-12-26 MED ORDER — DOXYCYCLINE HYCLATE 100 MG PO TABS
100.0000 mg | ORAL_TABLET | Freq: Two times a day (BID) | ORAL | Status: AC
  Administered 2023-12-27 – 2023-12-29 (×6): 100 mg via ORAL
  Filled 2023-12-26 (×6): qty 1

## 2023-12-26 MED ORDER — IPRATROPIUM-ALBUTEROL 0.5-2.5 (3) MG/3ML IN SOLN
3.0000 mL | Freq: Four times a day (QID) | RESPIRATORY_TRACT | Status: DC
  Administered 2023-12-26 – 2023-12-27 (×3): 3 mL via RESPIRATORY_TRACT
  Filled 2023-12-26 (×3): qty 3

## 2023-12-26 MED ORDER — SODIUM CHLORIDE 0.9 % IV SOLN: INTRAVENOUS | Status: DC

## 2023-12-26 NOTE — Progress Notes (Signed)
ANTICOAGULATION CONSULT NOTE  Pharmacy Consult for heparin infusion Indication: NSTEMI and new A fib   Allergies  Allergen Reactions   Lisinopril Swelling    Patient Measurements: Height: 5\' 1"  (154.9 cm) Weight: 62 kg (136 lb 11 oz) (From O/V note on 12/11/23) IBW/kg (Calculated) : 47.8 Heparin Dosing Weight: 60.4 kg  Vital Signs: BP: 117/63 (12/26 0530) Pulse Rate: 68 (12/26 0500)  Labs: Recent Labs    12/25/23 1625 12/25/23 1837 12/25/23 2057 12/25/23 2345 12/26/23 0108 12/26/23 0400 12/26/23 0515  HGB 12.0  --   --   --   --   --  9.9*  HCT 37.3  --   --   --   --   --  31.1*  PLT 216  --   --   --   --   --  213  APTT  --   --   --  42*  --   --   --   LABPROT  --   --   --  16.8*  --   --   --   INR  --   --   --  1.3*  --   --   --   CREATININE 2.36*  --   --   --   --  1.84*  --   TROPONINIHS 85*   < > 195*  --  202* 172*  --    < > = values in this interval not displayed.    Estimated Creatinine Clearance: 20.6 mL/min (A) (by C-G formula based on SCr of 1.84 mg/dL (H)).   Medical History: Past Medical History:  Diagnosis Date   Bladder cancer [facility])    s/p cystectomy   Diabetes mellitus without complication (HCC)    Hypertension     Assessment: Pt is a 82 yo female w/ PMH of bladder cancer (s/p bladder removal w/ ileal-conduit with urostomy), HTN, DM, COPD, CAD, schizophrenia, depression with psychosis, CKD-3A, UTI, PVC, tobacco abuse presenting to ED c/o "feeling off"  found with elevated troponin I level, trending up, and new onset A. Fib. No identifiable chronic anticoagulation prior to arrival  Baseline labs: INR 1.3, aPTT 42s, H&H/PLT wnl  Goal of Therapy:  Heparin level 0.3-0.7 units/ml Monitor platelets by anticoagulation protocol: Yes   Plan: 1st heparin level subtherapeutic ---Bolus 1800 units IV heparin x 1 ---increase heparin infusion rate to 1050 units/hr ---Will check heparin level in 8 hours after rate change ---CBC daily while  on heparin  Burnis Medin, PharmD, BCPS 12/26/2023 7:38 AM

## 2023-12-26 NOTE — ED Notes (Signed)
RN Dellie Burns had received the pt and allowed for family to stay in the room while getting the pt settled in her new room ER 34. Pt was very pleasant but as RN Jes attempted to perform her duty of examining the pt family would interrupt her and become verbally aggressive demanding RN Dellie Burns get pt to a hospital room upstairs, check her oxygen (even though the pt was already connected to the senors), demanding the pt be cleaned up, ect when RN Jes had just received the pt. Family was also standing in the way to hover over RN Jes as she preformed pt care raising their voice. RN Dellie Burns had noticed that the pts urostomy was leaking around the edges and had removed the bag then covered it with a clean towel while preparing for the new bag, family had yelled at the RN Jes that she had done everything wrong and told her to move while the family member changed the urostomy bag herself. RN Dellie Burns was humble and explained the process but the family did not want to hear anything from the RN. MD Fran Lowes was also a witness to the family yelling at the [employer]. I did have to call security to escort one of the three family members out after asking multiple times and explaining the ER rules on visitors.

## 2023-12-26 NOTE — ED Notes (Addendum)
Family at bedside, NT attempted to move pt to CPOD. Family in the room states she does not need to be moved at this time. This RN at bedside, family is requesting to speak with MD taking care of the patient. Family reports they are concerned with increased CO2 levels. On assessment, pt is arousable to voice but does go back to sleep. Dr. Fran Lowes, MD notified at this time. Explained to the family that pt would still need to be moved to CPOD and that I would contact the doctor. Family verbalized understanding at this time.

## 2023-12-26 NOTE — Consult Note (Signed)
Cardiology Consultation   Patient ID: EMMARI PRUSKI MRN: [id number]; DOB: 1941-06-19  Admit date: 12/25/2023 Date of Consult: 12/26/2023  PCP:  Evette Cristal, MD   [facility] Providers Cardiologist:  None      New consult completed by Dr Mariah Milling  Patient Profile:   Brittany Hernandez is a 82 y.o. female with a hx of bladder cancer s/p bladder removal(with ileal conduit with urostomy), HTN, type II diabetes, COPD, CAD, schizophrenia, depression with psychosis, CKD-3A, recurrent UTI, who is being seen 12/26/2023 for the evaluation of elevated troponins and new onset atrial fibrillation at the request of Dr Clyde Lundborg.  History of Present Illness:   Brittany Hernandez presented to the [facility] emergency department on 12/25/2023 via EMS complaining of feeling off since yesterday.  Her daughter remains at the bedside reported that she been complaining of shortness of breath, dark urine and has not been drinking enough water.  The patient stated that she felt lightheaded and woozy.  Oxygenation was 89-90% on room air and she had to be placed on 2 L of O2 via nasal cannula.  Initially she was noted to be hypotensive with blood pressure of 80/50 which improved to 116/60.  She also complains of upper abdominal pain mainly on the left that started earlier today.  She denied any vomiting or diarrhea.  She continued to have some shortness of breath.  Last outpatient encounter was with family medicine at [facility] on 12/11 for following up of her chronic conditions.  At that time she reported fluctuations in her blood pressure.  She denies any chest pain, palpitations, or peripheral edema.  According to her daughter she had some concerns of her mentation and was feeling like she may have had another UTI but was concerned of her shortness of breath even though she continues to smoke.  Code sepsis protocol was started due to lactic acid of 2.8.  She received fluid resuscitation of 3 L of LR and was started on  antibiotics.  Initial vital signs: Blood pressure 105/50, pulse 76, respirations 22, pulse oximetry was 100%.  Pertinent labs: Lactic acid 2.8, repeat 2.3, sodium 133, potassium 5.9, CO2 17, blood glucose 201, BUN 56, serum creatinine 2.36, albumin of 3.3, total bilirubin 1.3, WBC 27.9, high-sensitivity troponin trended 85 and 118, respiratory panel negative for COVID, RSV, flu A/B  Imaging: Chest x-ray showed shallow inspiration with chronic elevation of the right hemidiaphragm versus diaphragmatic hernia with colonic interposition that is unchanged, developing small pleural effusion with basilar atelectasis or infiltration; CT of the abdomen and pelvis large hiatal hernia containing stomach, transverse colon and small bowel loops, left lower lobe airspace disease concerning for pneumonia, aortic atherosclerosis, coronary artery disease, multiple low-density lesions in the kidneys most which appear to reflect cyst  Medications administered in the emergency department: Lactated Ringer's 3 L IV, vancomycin 1000 mg IVPB, Flagyl 500 mg IVPB,   Past Medical History:  Diagnosis Date   Bladder cancer (HCC)    s/p cystectomy   Diabetes mellitus without complication (HCC)    Hypertension     Past Surgical History:  Procedure Laterality Date   ABDOMINAL HYSTERECTOMY     CHOLECYSTECTOMY     CYSTECTOMY W/ CONTINENT DIVERSION Right    URETEROSTOMY Right      Home Medications:  Prior to Admission medications   Medication Sig Start Date End Date Taking? Authorizing Provider  acetaminophen (TYLENOL) 500 MG tablet Take 1,000 mg by mouth every 8 (eight) hours as needed  for pain. 11/13/17   [provider]  albuterol (PROVENTIL HFA;VENTOLIN HFA) 108 (90 Base) MCG/ACT inhaler Inhale 2 puffs into the lungs every 4 (four) hours as needed for wheezing. 08/11/18   [provider]  fluPHENAZine decanoate (PROLIXIN) 25 MG/ML injection  04/13/15   [provider]  furosemide (LASIX)  20 MG tablet Take 20 mg by mouth daily.    [provider]  gabapentin (NEURONTIN) 100 MG capsule 1 capsule today and then beginning tomorrow take 1 capsule twice a day. Patient taking differently: Take 100 mg by mouth 2 (two) times daily.  08/04/18   Bridget Hartshorn L, PA-C  lidocaine (LIDODERM) 5 % Place 1 patch onto the skin daily. Remove & Discard patch within 24 hours or as directed by MD 07/20/20 07/20/21  Joni Reining, PA-C  losartan (COZAAR) 100 MG tablet Take 100 mg by mouth daily.    [provider]  meclizine (ANTIVERT) 12.5 MG tablet Take 1 tablet (12.5 mg total) by mouth 2 (two) times daily as needed for dizziness. 05/08/15   Gale Journey, MD  metFORMIN (GLUCOPHAGE) 500 MG tablet Take 500 mg by mouth daily. 06/13/18   [provider]  topiramate (TOPAMAX) 25 MG tablet Take 25 mg by mouth Nightly. 11/25/18   [provider]    Inpatient Medications: Scheduled Meds:  aspirin EC  325 mg Oral Daily   atorvastatin  40 mg Oral Daily   gabapentin  100 mg Oral BID   insulin aspart  0-5 Units Subcutaneous QHS   insulin aspart  0-9 Units Subcutaneous TID WC   nicotine  21 mg Transdermal Daily   topiramate  25 mg Oral QHS   Continuous Infusions:  sodium chloride     cefTRIAXone (ROCEPHIN)  IV     doxycycline (VIBRAMYCIN) IV 100 mg (12/26/23 0940)   heparin 1,050 Units/hr (12/26/23 0857)   PRN Meds: acetaminophen, albuterol, dextromethorphan-guaiFENesin, ondansetron (ZOFRAN) IV  Allergies:    Allergies  Allergen Reactions   Lisinopril Swelling    Social History:   Social History   Socioeconomic History   Marital status: Widowed    Spouse name: Not on file   Number of children: Not on file   Years of education: Not on file   Highest education level: Not on file  Occupational History   Not on file  Tobacco Use   Smoking status: Every Day    Current packs/day: 1.00    Average packs/day: 1 pack/day for 50.0 years (50.0 ttl pk-yrs)     Types: Cigarettes   Smokeless tobacco: Not on file  Substance and Sexual Activity   Alcohol use: No   Drug use: No   Sexual activity: Not on file  Other Topics Concern   Not on file  Social History Narrative   Not on file   Social Drivers of Health   Financial Resource Strain: Low Risk  (12/11/2023)   Received from [facility]   Overall Financial Resource Strain (CARDIA)    Difficulty of Paying Living Expenses: Not hard at all  Food Insecurity: No Food Insecurity (12/11/2023)   Received from [facility]   Hunger Vital Sign    Worried About Running Out of Food in the Last Year: Never true    Ran Out of Food in the Last Year: Never true  Transportation Needs: No Transportation Needs (12/11/2023)   Received from [facility] - Transportation    Lack of Transportation (Medical): No  Lack of Transportation (Non-Medical): No  Physical Activity: Inactive (08/12/2023)   Received from [facility]   Exercise Vital Sign    Days of Exercise per Week: 0 days    Minutes of Exercise per Session: 0 min  Stress: Stress Concern Present (08/12/2023)   Received from [facility] - Occupational Stress Questionnaire    Feeling of Stress : To some extent  Social Connections: Moderately Isolated (08/12/2023)   Received from [facility]   Social Connection and Isolation Panel [NHANES]    Frequency of Communication with Friends and Family: More than three times a week    Frequency of Social Gatherings with Friends and Family: Once a week    Attends Religious Services: More than 4 times per year    Active Member of [employer] or Organizations: No    Attends Banker Meetings: Never    Marital Status: Widowed  Intimate Partner Violence: Not At Risk (08/12/2023)   Received from [facility]   Humiliation, Afraid, Rape, and Kick questionnaire    Fear of Current or Ex-Partner: No    Emotionally Abused: No     Physically Abused: No    Sexually Abused: No    Family History:    Family History  Problem Relation Age of Onset   Diabetes Mellitus II Other    CAD Other      ROS:  Please see the history of present illness.  Review of Systems  Constitutional:  Positive for malaise/fatigue.  Respiratory:  Positive for cough and shortness of breath.   Cardiovascular:  Positive for leg swelling.  Gastrointestinal:  Positive for abdominal pain.  Neurological:  Positive for weakness.    All other ROS reviewed and negative.     Physical Exam/Data:   Vitals:   12/26/23 0500 12/26/23 0530 12/26/23 0801 12/26/23 0900  BP: 106/63 117/63  131/61  Pulse: 68   77  Resp: 20 (!) 28  20  Temp:   98.2 F (36.8 C)   TempSrc:   Oral   SpO2: 97% 99%  100%  Weight:      Height:        Intake/Output Summary (Last 24 hours) at 12/26/2023 1004 Last data filed at 12/26/2023 0902 Gross per 24 hour  Intake 2538.6 ml  Output 175 ml  Net 2363.6 ml      12/25/2023   11:00 PM 07/20/2020    5:56 AM 06/16/2020    4:57 PM  Last 3 Weights  Weight (lbs) 136 lb 11 oz 133 lb 135 lb  Weight (kg) 62 kg 60.328 kg 61.236 kg     Body mass index is 25.83 kg/m.  General:  Well nourished, well developed, in no acute distress HEENT: normal Neck: no JVD Vascular: No carotid bruits; Distal pulses 2+ bilaterally Cardiac:  normal S1, S2; RRR; no murmur  Lungs:  clear to auscultation bilaterally, no wheezing, rhonchi or rales  Abd: soft, nontender, no hepatomegaly  Ext: Trace pretibial edema Musculoskeletal:  No deformities, BUE and BLE strength normal and equal Skin: warm and dry  Neuro:  CNs 2-12 intact, no focal abnormalities noted Psych:  Normal affect   EKG:  The EKG was personally reviewed and demonstrates: Atrial flutter with varying AV block with a rate of 129, LVH, nonspecific T waves Telemetry:  Telemetry was personally reviewed and demonstrates: Sinus rhythm with multiple unifocal PVCs and occasional  trigeminy with rates of 60-80  Relevant  CV Studies: Echocardiogram ordered and pending  Laboratory Data:  High Sensitivity Troponin:   Recent Labs  Lab 12/25/23 1625 12/25/23 1837 12/25/23 2057 12/26/23 0108 12/26/23 0400  TROPONINIHS 85* 118* 195* 202* 172*     Chemistry Recent Labs  Lab 12/25/23 1625 12/25/23 2057 12/26/23 0400  NA 133*  --  134*  K 5.9* 4.5 4.0  CL 100  --  103  CO2 17*  --  21*  GLUCOSE 201*  --  145*  BUN 56*  --  51*  CREATININE 2.36*  --  1.84*  CALCIUM 9.7  --  8.7*  GFRNONAA 20*  --  27*  ANIONGAP 16*  --  10    Recent Labs  Lab 12/25/23 1625  PROT 7.3  ALBUMIN 3.3*  AST 29  ALT 10  ALKPHOS 69  BILITOT 1.3*   Lipids  Recent Labs  Lab 12/26/23 0400  CHOL 110  TRIG 97  HDL 39*  LDLCALC 52  CHOLHDL 2.8    Hematology Recent Labs  Lab 12/25/23 1625 12/26/23 0515  WBC 27.9* 22.9*  RBC 4.15 3.42*  HGB 12.0 9.9*  HCT 37.3 31.1*  MCV 89.9 90.9  MCH 28.9 28.9  MCHC 32.2 31.8  RDW 16.0* 16.2*  PLT 216 213   Thyroid  Recent Labs  Lab 12/25/23 2057  TSH 3.259  FREET4 1.09    BNPNo results for input(s): "BNP", "PROBNP" in the last 168 hours.  DDimer No results for input(s): "DDIMER" in the last 168 hours.   Radiology/Studies:  CT ABDOMEN PELVIS WO CONTRAST Result Date: 12/25/2023 CLINICAL DATA:  Abdominal pain, acute, nonlocalized EXAM: CT ABDOMEN AND PELVIS WITHOUT CONTRAST TECHNIQUE: Multidetector CT imaging of the abdomen and pelvis was performed following the standard protocol without IV contrast. RADIATION DOSE REDUCTION: This exam was performed according to the departmental dose-optimization program which includes automated exposure control, adjustment of the mA and/or kV according to patient size and/or use of iterative reconstruction technique. COMPARISON:  None Available. FINDINGS: Lower chest: There is a large hiatal hernia containing stomach as well as transverse colon and small bowel loops. Much of the hernia  is in the right lower chest. Airspace disease in the left lower lobe concerning for pneumonia. No effusions. Coronary artery and aortic atherosclerosis. Hepatobiliary: No focal liver abnormality is seen. Status post cholecystectomy. No biliary dilatation. Pancreas: No focal abnormality or ductal dilatation. Spleen: No focal abnormality.  Normal size. Adrenals/Urinary Tract: Adrenal glands normal. Bilateral renal low-density lesions, many of which appear to reflect cysts. Indeterminate lesion in the upper pole of the right kidney measures up to 2.9 cm. 1.7 cm indeterminate lesion in the midpole of the left kidney. No hydronephrosis. Urinary bladder not visualized with surgical clips in the pelvis, likely prior cystectomy. Right lower quadrant urostomy noted. Stomach/Bowel: No bowel obstruction or inflammatory process. Sigmoid diverticulosis. Vascular/Lymphatic: Aortoiliac atherosclerosis. No evidence of aneurysm or adenopathy. Reproductive: Prior hysterectomy.  No adnexal masses. Other: No free fluid or free air. Musculoskeletal: No acute bony abnormality. IMPRESSION: Large hiatal hernia containing stomach, transverse colon and small bowel loops. Left lower lobe airspace disease concerning for pneumonia. Aortic atherosclerosis, coronary artery disease. Multiple low-density lesions in the kidneys, most of which appear to reflect cysts. Indeterminate right upper pole and left midpole lesions noted which are not definitively cystic. These could be further evaluated with elective renal ultrasound. Electronically Signed   By: Charlett Nose M.D.   On: 12/25/2023 19:38   DG Chest [facility] 1 View Result  Date: 12/25/2023 CLINICAL DATA:  Question of sepsis to evaluate for abnormality. Shortness of breath, dark urine, low oxygen saturation. History of COPD. EXAM: PORTABLE CHEST 1 VIEW COMPARISON:  06/16/2020 FINDINGS: Shallow inspiration with elevation of right hemidiaphragm versus right diaphragmatic hernia. Gas in the right  hemidiaphragm likely represents colonic interposition. This is unchanged since prior study. Suggestion of small developing pleural effusions with basilar atelectasis or infiltration. Possible pneumonia versus compressive atelectasis. No pneumothorax. Heart size and pulmonary vascularity are normal for technique. Calcification of the aorta. Degenerative changes in the right shoulder. Postoperative change in the left shoulder. IMPRESSION: 1. 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 2. Developing small pleural effusions with basilar atelectasis or infiltration. Electronically Signed   By: Burman Nieves M.D.   On: 12/25/2023 17:13     Assessment and Plan:   Severe sepsis/CAP/UTI -Patient with WBC of 27.9 and a lactic acid of 2.8 on arrival -Received IV fluid resuscitation and started on antibiotic therapy -Initially was hypotensive with a blood pressure of 77/66 -Blood pressure has stabilized. -Pending urine culture, blood culture and sputum culture -Continue with supportive care -Continued management per IM  New onset atrial flutter/atrial fibrillation -Patient presented with new onset atrial flutter noted on EKG -Started on heparin infusion for CHA2DS2-VASc score of at least 6 for stroke prophylaxis -TSH 3.259 and a free T4 of 1.09 -Currently on heparin infusion would likely need DOAC prior to discharge, discussed with patient and daughter this morning -Continue with telemetry monitoring -Consider placing ZIO on discharge to determine A-fib/flutter burden -Potassium 5.9 and then after IV hydration 4.5 -Mg level added onto labs -Echocardiogram ordered and pending with further recommendations to follow  Elevated high-sensitivity troponin -Patient with high-sensitivity troponins trended 85-> 118-> 195-> 202-> 172 -Likely demand ischemia in the setting of sepsis (CAP and UTI) and elevated heart rate  from atrial flutter/atrial fibrillation -Continued on aspirin and atorvastatin -Patient remains chest pain-free -Currently on heparin infusion -EKG with new onset atrial flutter with nonischemic changes noted -EKG as needed for pain or changes  Hypertension -Blood pressure 131/61 -PTA candesartan remains on hold due to AKI -Vital signs per unit protocol  Acute on chronic kidney disease stage IIIa -Serum creatinine 1.84 -Improved from 2.36 on arrival with 3 L of IV hydration -Baseline is 0.85-0.92 -Monitor urine output -Monitor/trend/replete electrolytes as needed -Daily BMP -Avoid nephrotoxic agents were able  COPD with emphysema -Complaints of shortness of breath -Occasional cough -Continues to smoke -Bronchodilators and Mucinex -Smoking cessation is advised  Type 2 diabetes -Continued on insulin -Hemoglobin A1c 6.8 -Management per IM   Risk Assessment/Risk Scores:          CHA2DS2-VASc Score = 6   This indicates a 9.7% annual risk of stroke. The patient's score is based upon: CHF History: 0 HTN History: 1 Diabetes History: 1 Stroke History: 0 Vascular Disease History: 1 Age Score: 2 Gender Score: 1         For questions or updates, please contact [facility] Please consult [email or web address] for contact info under    Signed, Wood Novacek, NP  12/26/2023 10:04 AM

## 2023-12-26 NOTE — ED Notes (Signed)
Pt given incentive spirometer with teach back by this RN.

## 2023-12-26 NOTE — Progress Notes (Signed)
PROGRESS NOTE    Brittany Hernandez  WGN:[id number] DOB: December 24, 1941 DOA: 12/25/2023 PCP: Evette Cristal, MD  [id number]  LOS: 1 day   Brief hospital course:   Assessment & Plan: Brittany Hernandez is a 82 y.o. female with medical history significant of bladder cancer (s/p bladder removal w/ ileal-conduit with urostomy), HTN, DM, COPD, CAD, schizophrenia, depression with psychosis, CKD-3A, UTI, PVC, tobacco abuse, who presents with weakness and cough with brownish colored sputum production.    Severe sepsis [facility]): Patient has severe sepsis with WBC 27.9, heart rate up to 136, RR 25.  Lactic acid 2.8 --> 2.3.  This is likely due to combination of CAP and UTI.  Patient was initially hypotensive with blood pressure 77/66, which improved return 138/81 after giving 3 L of LR bolus in ED. --patient received 1 dose of vancomycin, cefepime and Flagyl in ED   CAP (community acquired pneumonia):  --CT a/p showed Left lower lobe airspace disease concerning for pneumonia.  Leukocytosis, procal 14.97. --patient received 1 dose of vancomycin, cefepime and Flagyl in ED --cont abx as ceftriaxone and doxy   UTI (urinary tract infection) ileal-conduit with urostomy --UA mod leuk and many bacteria --on ceftriaxone for CAP already   Trop elevation 2/2 demand ischemia NSTEMI, ruled out --trop peaked at 202  hx of CAD:  -started ASA and lipitor 40 mg daily   New onset atrial fibrillation w RVR 136 --> 70s now. CHADS2 score is 6.  -started IV heparin --daughter prefers not to have her mother on anticoagulation unless additional atrial fibrillation can be documented  --Bisoprolol 2.5 daily --tele now, Zio monitor at discharge   HTN (hypertension) --BP low initially --hold losartan and lasix   COPD (chronic obstructive pulmonary disease) with emphysema (HCC) --DuoNeb   Type II diabetes mellitus with renal manifestations (HCC): Recent A1c 6.8, well-controlled.  Patient is taking metformin -Slight  scale insulin   Acute renal failure superimposed on stage 3a chronic kidney disease (HCC): Likely multifactorial etiology, including UTI, continuation of Cozaar and Lasix, ATN is also possible due to hypotension.   --Cr 2.36 on presentation, improved to 1.84 the next day. --cont MIVF   Severe recurrent depression with psychosis (HCC) and Schizoaffective disorder (HCC): pt is calm now --cont Topamax   Kidney lesion: CT showed multiple low-density lesions in the kidneys, most of which appear to reflect cysts. Indeterminate right upper pole and left midpole lesions noted which are not definitively cystic.  -need to f/u with PCP or urology for outpt work up.   DVT prophylaxis: HQ:IONGEXB gtt Code Status: Full code  Family Communication: daughter updated at bedside today Level of care: Progressive Dispo:   The patient is from: home Anticipated d/c is to: home Anticipated d/c date is: 2-3 days   Subjective and Interval History:  Pt reported feeling numbness in her feet, some abdominal discomfort.  Daughter believed pt was more sleepy.   Objective: Vitals:   12/26/23 1430 12/26/23 1500 12/26/23 1530 12/26/23 1651  BP: (!) 125/57 120/78 110/68   Pulse: 73 75 76   Resp: (!) 27 (!) 26 (!) 22   Temp:    97.9 F (36.6 C)  TempSrc:    Oral  SpO2: 100% 99% 100%   Weight:      Height:        Intake/Output Summary (Last 24 hours) at 12/26/2023 1849 Last data filed at 12/26/2023 1800 Gross per 24 hour  Intake 2638.6 ml  Output 175 ml  Net 2463.6  ml   Filed Weights   12/25/23 2300  Weight: 62 kg    Examination:   Constitutional: NAD, alert, oriented HEENT: conjunctivae and lids normal, EOMI CV: No cyanosis.   RESP: normal respiratory effort, on 2L Extremities: some edema in BLE SKIN: warm, dry urostomy present   Data Reviewed: I have personally reviewed labs and imaging studies  Time spent: 50 minutes  Darlin Priestly, MD [facility] If 7PM-7AM, please contact  night-coverage 12/26/2023, 6:49 PM

## 2023-12-26 NOTE — ED Notes (Addendum)
Nurse Tech- Sage went to get Pt from Rm 13 and family kept telling NT she needed to call the MD because they knew protocol and get her upstairs. Charge nurse- Morrie Sheldon notified, and stated she would talk to family.  Pt arrives over here from Main ED RM 13. This RN and Ladona Ridgel and ( 2) Nurse Techs aid in changing Pt. This RN noticed urostomy was seeping at the edges and the bag had holes in it from being full, daughter stated she was going to grab extra supplies so this RN removed bag as it was leaking everywhere. We attempted to clean d/t noticeable moisture and skin breakdown. Daughter of Pt steps in and starts degrading this RN, Futures trader and nurse techs saying "we were cleaning the bag wrong." This RN stepped in and told family member we needed to be respectful around the Pt as our goal is to try and take care of the Pt and meet her needs. Ladona Ridgel RN had family members step out of the room as they were also trying to argue with medical staff. MD arrives at bedside and witnessed encounter of family members trying to argue with nursing staff. This RN told daughter and family members degrading won't be tolerated and if it continued they would be asked to leave. Ladona Ridgel RN notified security, and they spoke with family.  Family members visibly upset and voiced concerns that they had been waiting in the ED for >24 hrs and we need to let the MD know to get the Pt upstairs asap. This RN told Pt and family we have no control over beds and we take care of them as best as we can down in CPOD until they get a bed on the floor.

## 2023-12-26 NOTE — Progress Notes (Addendum)
ANTICOAGULATION CONSULT NOTE  Pharmacy Consult for heparin infusion Indication: NSTEMI and new A fib   Allergies  Allergen Reactions   Losartan     Other Reaction(s): Dizziness   Hydrochlorothiazide     SIADH   Lisinopril Swelling    Patient Measurements: Height: 5\' 1"  (154.9 cm) Weight: 62 kg (136 lb 11 oz) (From O/V note on 12/11/23) IBW/kg (Calculated) : 47.8 Heparin Dosing Weight: 60.4 kg  Vital Signs: Temp: 97.9 F (36.6 C) (12/26 1651) Temp Source: Oral (12/26 1651) BP: 120/78 (12/26 1500) Pulse Rate: 75 (12/26 1500)  Labs: Recent Labs    12/25/23 1625 12/25/23 1837 12/25/23 2057 12/25/23 2345 12/26/23 0108 12/26/23 0400 12/26/23 0515 12/26/23 0805 12/26/23 1715  HGB 12.0  --   --   --   --   --  9.9*  --   --   HCT 37.3  --   --   --   --   --  31.1*  --   --   PLT 216  --   --   --   --   --  213  --   --   APTT  --   --   --  42*  --   --   --   --   --   LABPROT  --   --   --  16.8*  --   --   --   --   --   INR  --   --   --  1.3*  --   --   --   --   --   HEPARINUNFRC  --   --   --   --   --   --   --  0.18* 0.32  CREATININE 2.36*  --   --   --   --  1.84*  --   --   --   TROPONINIHS 85*   < > 195*  --  202* 172*  --   --   --    < > = values in this interval not displayed.    Estimated Creatinine Clearance: 20.6 mL/min (A) (by C-G formula based on SCr of 1.84 mg/dL (H)).   Medical History: Past Medical History:  Diagnosis Date   Bladder cancer [facility])    s/p cystectomy   Diabetes mellitus without complication (HCC)    Hypertension     Assessment: Pt is a 82 yo female w/ PMH of bladder cancer (s/p bladder removal w/ ileal-conduit with urostomy), HTN, DM, COPD, CAD, schizophrenia, depression with psychosis, CKD-3A, UTI, PVC, tobacco abuse presenting to ED c/o "feeling off"  found with elevated troponin I level, trending up, and new onset A. Fib. No identifiable chronic anticoagulation prior to arrival.  ~8 hr heparin level therapeutic x1  after previous rate change. No line issues or signs/symptoms of bleeding noted per RN.  Baseline labs: INR 1.3, aPTT 42s, H&H/PLT wnl  12/26 0805: HL 0.18, subtherapeutic 12/26 1715: HL 0.32, therapeutic x1  Goal of Therapy:  Heparin level 0.3-0.7 units/ml Monitor platelets by anticoagulation protocol: Yes   Plan:  ---Continue heparin infusion rate at 1050 units/hr ---Will re-check heparin level in 8 hours  ---CBC daily while on heparin  Jerrilyn Cairo, PharmD 12/26/2023 5:45 PM

## 2023-12-26 NOTE — ED Notes (Addendum)
Nurse Sande Rives went to get pt family started speaking to her aggressively. After family spoke with Lanae Boast". Sage didn't feel comfortable going back into Room 13. So I told her not to worry about it, I would be happy to go get her once I entered the room one out of the three family member stated " they sent you" and started to giggled. I continued moving the pt as they followed behind me.   Pt arrive over to Room 34 the family began to stand in the doorway giving orders such as removing her pants while the door was open. I stated if we was going to remove her pants we needed to closed the door for privacy. They came inside watching over [place] as we change the pt. Noticed her Urostomy was seeping urine out the edges of the wafer so we couldn't drain it so we removed the entire bag the daughter stated she was going to grab more out her car but the pt granddaughter ended up going outside . Daughter started to [employer] and [employer] and stated her mom been had it for 16 years and we shouldn't have removed the bag in the first place. Pt family continue to argue with staff until approached  by security and one family member being asked to leave

## 2023-12-27 ENCOUNTER — Inpatient Hospital Stay (HOSPITAL_COMMUNITY)
Admit: 2023-12-27 | Discharge: 2023-12-27 | Disposition: A | Discharge status: Home / Self Care | Payer: Medicare Other | Attending: Cardiovascular Disease | Admitting: Cardiovascular Disease

## 2023-12-27 DIAGNOSIS — I4891 Unspecified atrial fibrillation: Secondary | ICD-10-CM | POA: Diagnosis not present

## 2023-12-27 DIAGNOSIS — A419 Sepsis, unspecified organism: Secondary | ICD-10-CM | POA: Diagnosis not present

## 2023-12-27 DIAGNOSIS — I272 Pulmonary hypertension, unspecified: Secondary | ICD-10-CM

## 2023-12-27 DIAGNOSIS — J432 Centrilobular emphysema: Secondary | ICD-10-CM | POA: Diagnosis not present

## 2023-12-27 DIAGNOSIS — N179 Acute kidney failure, unspecified: Secondary | ICD-10-CM | POA: Diagnosis not present

## 2023-12-27 DIAGNOSIS — N390 Urinary tract infection, site not specified: Secondary | ICD-10-CM | POA: Diagnosis not present

## 2023-12-27 LAB — CBC
HCT: 29.6 % — ABNORMAL LOW (ref 36.0–46.0)
Hemoglobin: 9.6 g/dL — ABNORMAL LOW (ref 12.0–15.0)
MCH: 29.5 pg (ref 26.0–34.0)
MCHC: 32.4 g/dL (ref 30.0–36.0)
MCV: 91.1 fL (ref 80.0–100.0)
Platelets: 197 10*3/uL (ref 150–400)
RBC: 3.25 MIL/uL — ABNORMAL LOW (ref 3.87–5.11)
RDW: 16.2 % — ABNORMAL HIGH (ref 11.5–15.5)
WBC: 15.1 10*3/uL — ABNORMAL HIGH (ref 4.0–10.5)
nRBC: 0 % (ref 0.0–0.2)

## 2023-12-27 LAB — ECHOCARDIOGRAM COMPLETE
AR max vel: 2.45 cm2
AV Area VTI: 2.55 cm2
AV Area mean vel: 2.55 cm2
AV Mean grad: 3 mm[Hg]
AV Peak grad: 5.8 mm[Hg]
Ao pk vel: 1.2 m/s
Area-P 1/2: 3.08 cm2
Height: 61 in
MV VTI: 1.39 cm2
S' Lateral: 1.5 cm
Weight: 2186.96 [oz_av]

## 2023-12-27 LAB — HEPARIN LEVEL (UNFRACTIONATED)
Heparin Unfractionated: 0.18 [IU]/mL — ABNORMAL LOW (ref 0.30–0.70)
Heparin Unfractionated: 0.42 [IU]/mL (ref 0.30–0.70)

## 2023-12-27 LAB — BASIC METABOLIC PANEL
Anion gap: 9 (ref 5–15)
BUN: 37 mg/dL — ABNORMAL HIGH (ref 8–23)
CO2: 18 mmol/L — ABNORMAL LOW (ref 22–32)
Calcium: 8.4 mg/dL — ABNORMAL LOW (ref 8.9–10.3)
Chloride: 104 mmol/L (ref 98–111)
Creatinine, Ser: 1.29 mg/dL — ABNORMAL HIGH (ref 0.44–1.00)
GFR, Estimated: 42 mL/min — ABNORMAL LOW (ref >60)
Glucose, Bld: 148 mg/dL — ABNORMAL HIGH (ref 70–99)
Potassium: 4 mmol/L (ref 3.5–5.1)
Sodium: 131 mmol/L — ABNORMAL LOW (ref 135–145)

## 2023-12-27 LAB — GLUCOSE, CAPILLARY
Glucose-Capillary: 155 mg/dL — ABNORMAL HIGH (ref 70–99)
Glucose-Capillary: 156 mg/dL — ABNORMAL HIGH (ref 70–99)
Glucose-Capillary: 116 mg/dL — ABNORMAL HIGH (ref 70–99)

## 2023-12-27 LAB — LEGIONELLA PNEUMOPHILA SEROGP 1 UR AG: L. pneumophila Serogp 1 Ur Ag: NEGATIVE

## 2023-12-27 LAB — CBG MONITORING, ED: Glucose-Capillary: 166 mg/dL — ABNORMAL HIGH (ref 70–99)

## 2023-12-27 LAB — T3, FREE: T3, Free: 1.3 pg/mL — ABNORMAL LOW (ref 2.0–4.4)

## 2023-12-27 LAB — MAGNESIUM: Magnesium: 1.8 mg/dL (ref 1.7–2.4)

## 2023-12-27 MED ORDER — BISOPROLOL FUMARATE 5 MG PO TABS
5.0000 mg | ORAL_TABLET | Freq: Every day | ORAL | Status: DC
  Filled 2023-12-27 (×2): qty 1

## 2023-12-27 MED ORDER — IPRATROPIUM-ALBUTEROL 0.5-2.5 (3) MG/3ML IN SOLN
3.0000 mL | Freq: Three times a day (TID) | RESPIRATORY_TRACT | Status: DC
  Administered 2023-12-27: 3 mL via RESPIRATORY_TRACT
  Filled 2023-12-27 (×2): qty 3

## 2023-12-27 MED ORDER — HEPARIN BOLUS VIA INFUSION
1800.0000 [IU] | Freq: Once | INTRAVENOUS | Status: AC
  Administered 2023-12-27: 1800 [IU] via INTRAVENOUS
  Filled 2023-12-27: qty 1800

## 2023-12-27 MED ORDER — SODIUM CHLORIDE 0.9 % IV SOLN: INTRAVENOUS | Status: DC

## 2023-12-27 MED ORDER — SODIUM CHLORIDE 0.9 % IV SOLN
1.0000 g | INTRAVENOUS | Status: DC
  Administered 2023-12-27: 1 g via INTRAVENOUS
  Filled 2023-12-27: qty 10

## 2023-12-27 MED ORDER — HEPARIN SODIUM (PORCINE) 5000 UNIT/ML IJ SOLN
5000.0000 [IU] | Freq: Three times a day (TID) | INTRAMUSCULAR | Status: DC
  Administered 2023-12-27 – 2023-12-31 (×11): 5000 [IU] via SUBCUTANEOUS
  Filled 2023-12-27 (×11): qty 1

## 2023-12-27 MED ORDER — MAGNESIUM HYDROXIDE 400 MG/5ML PO SUSP
30.0000 mL | Freq: Every day | ORAL | Status: DC | PRN
  Administered 2023-12-27: 30 mL via ORAL
  Filled 2023-12-27: qty 30

## 2023-12-27 MED ORDER — FUROSEMIDE 20 MG PO TABS
20.0000 mg | ORAL_TABLET | Freq: Every day | ORAL | Status: DC
  Administered 2023-12-27 – 2023-12-31 (×5): 20 mg via ORAL
  Filled 2023-12-27 (×5): qty 1

## 2023-12-27 MED ORDER — POLYETHYLENE GLYCOL 3350 17 G PO PACK
34.0000 g | PACK | ORAL | Status: AC
  Administered 2023-12-27 (×3): 34 g via ORAL
  Filled 2023-12-27 (×4): qty 2

## 2023-12-27 MED ORDER — SODIUM CHLORIDE 0.9 % IV SOLN
1.0000 g | INTRAVENOUS | Status: DC
  Administered 2023-12-28 – 2023-12-29 (×2): 1 g via INTRAVENOUS
  Filled 2023-12-27 (×2): qty 10

## 2023-12-27 NOTE — ED Notes (Addendum)
PATIENTS DAUGHTER REQUESTED RN TO ROOM AND REQUESTED MD TO BEDSIDE BECAUSE SHE WAS HAVING THE PATIENT TRANSFERRED TO ANOTHER FACILITY; THE FOLLOWING MESSAGE WAS SENT TO LAI,MD MORRISON,NP; AND STEPHEN, RN CHARGE:  Hello; Brittany Hernandez daughter just approached me stating that she was having her Mother transferred to another facility in [facility] and wants to speak with the Doctor because she is "not putting up with not having a room after 24 hours and that this whole admission is unacceptable" and she "already spoke with the doctors at the other hospital where they have a room for her that's much bigger and a better hospital any way"

## 2023-12-27 NOTE — ED Notes (Signed)
HOUSE SUPERVISOR TO BEDSIDE AT THIS TIME TO SPEAK WITH [name] REGARDING TRANSFER

## 2023-12-27 NOTE — Progress Notes (Signed)
ANTICOAGULATION CONSULT NOTE  Pharmacy Consult for heparin infusion Indication: NSTEMI and new A fib   Allergies  Allergen Reactions   Losartan     Other Reaction(s): Dizziness   Hydrochlorothiazide     SIADH   Lisinopril Swelling    Patient Measurements: Height: 5\' 1"  (154.9 cm) Weight: 62 kg (136 lb 11 oz) (From O/V note on 12/11/23) IBW/kg (Calculated) : 47.8 Heparin Dosing Weight: 60.4 kg  Vital Signs: Temp: 98.2 F (36.8 C) (12/26 2254) Temp Source: Oral (12/26 2254) BP: 109/65 (12/27 0100) Pulse Rate: 64 (12/26 2255)  Labs: Recent Labs    12/25/23 1625 12/25/23 1837 12/25/23 2057 12/25/23 2345 12/26/23 0108 12/26/23 0400 12/26/23 0515 12/26/23 0805 12/26/23 1715 12/27/23 0211  HGB 12.0  --   --   --   --   --  9.9*  --   --  9.6*  HCT 37.3  --   --   --   --   --  31.1*  --   --  29.6*  PLT 216  --   --   --   --   --  213  --   --  197  APTT  --   --   --  42*  --   --   --   --   --   --   LABPROT  --   --   --  16.8*  --   --   --   --   --   --   INR  --   --   --  1.3*  --   --   --   --   --   --   HEPARINUNFRC  --   --   --   --   --   --   --  0.18* 0.32 0.18*  CREATININE 2.36*  --   --   --   --  1.84*  --   --   --  1.29*  TROPONINIHS 85*   < > 195*  --  202* 172*  --   --   --   --    < > = values in this interval not displayed.    Estimated Creatinine Clearance: 29.4 mL/min (A) (by C-G formula based on SCr of 1.29 mg/dL (H)).   Medical History: Past Medical History:  Diagnosis Date   Bladder cancer [facility])    s/p cystectomy   Diabetes mellitus without complication (HCC)    Hypertension     Assessment: Pt is a 82 yo female w/ PMH of bladder cancer (s/p bladder removal w/ ileal-conduit with urostomy), HTN, DM, COPD, CAD, schizophrenia, depression with psychosis, CKD-3A, UTI, PVC, tobacco abuse presenting to ED c/o "feeling off"  found with elevated troponin I level, trending up, and new onset A. Fib. No identifiable chronic  anticoagulation prior to arrival.  ~8 hr heparin level therapeutic x1 after previous rate change. No line issues or signs/symptoms of bleeding noted per RN.  Baseline labs: INR 1.3, aPTT 42s, H&H/PLT wnl  12/26 0805: HL 0.18, subtherapeutic 12/26 1715: HL 0.32, therapeutic x1 12/27 0211 HL 0.18, subtherapeutic  Goal of Therapy:  Heparin level 0.3-0.7 units/ml Monitor platelets by anticoagulation protocol: Yes   Plan:  ---Bolus 1800 units x 1 ---Increase heparin infusion rate to 1250 units/hr ---Will re-check heparin level in 8 hours  ---CBC daily while on heparin  Otelia Sergeant, PharmD, [facility] 12/27/2023 3:13 AM

## 2023-12-27 NOTE — Progress Notes (Signed)
ANTICOAGULATION CONSULT NOTE  Pharmacy Consult for heparin infusion Indication: NSTEMI and new A fib   Allergies  Allergen Reactions   Losartan     Other Reaction(s): Dizziness   Hydrochlorothiazide     SIADH   Lisinopril Swelling    Patient Measurements: Height: 5\' 1"  (154.9 cm) Weight: 62 kg (136 lb 11 oz) (From O/V note on 12/11/23) IBW/kg (Calculated) : 47.8 Heparin Dosing Weight: 60.4 kg  Vital Signs: Temp: 98.2 F (36.8 C) (12/27 1221) Temp Source: Oral (12/27 1052) BP: 127/67 (12/27 1221) Pulse Rate: 73 (12/27 1221)  Labs: Recent Labs    12/25/23 1625 12/25/23 1837 12/25/23 2057 12/25/23 2345 12/26/23 0108 12/26/23 0400 12/26/23 0515 12/26/23 0805 12/26/23 1715 12/27/23 0211 12/27/23 1242  HGB 12.0  --   --   --   --   --  9.9*  --   --  9.6*  --   HCT 37.3  --   --   --   --   --  31.1*  --   --  29.6*  --   PLT 216  --   --   --   --   --  213  --   --  197  --   APTT  --   --   --  42*  --   --   --   --   --   --   --   LABPROT  --   --   --  16.8*  --   --   --   --   --   --   --   INR  --   --   --  1.3*  --   --   --   --   --   --   --   HEPARINUNFRC  --   --   --   --   --   --   --    < > 0.32 0.18* 0.42  CREATININE 2.36*  --   --   --   --  1.84*  --   --   --  1.29*  --   TROPONINIHS 85*   < > 195*  --  202* 172*  --   --   --   --   --    < > = values in this interval not displayed.    Estimated Creatinine Clearance: 29.4 mL/min (A) (by C-G formula based on SCr of 1.29 mg/dL (H)).   Medical History: Past Medical History:  Diagnosis Date   Bladder cancer [facility])    s/p cystectomy   Diabetes mellitus without complication (HCC)    Hypertension     Assessment: Pt is a 82 yo female w/ PMH of bladder cancer (s/p bladder removal w/ ileal-conduit with urostomy), HTN, DM, COPD, CAD, schizophrenia, depression with psychosis, CKD-3A, UTI, PVC, tobacco abuse presenting to ED c/o "feeling off"  found with elevated troponin I level, trending up,  and new onset A. Fib. No identifiable chronic anticoagulation prior to arrival.  ~8 hr heparin level therapeutic x1 after previous rate change. No line issues or signs/symptoms of bleeding noted per RN.  Baseline labs: INR 1.3, aPTT 42s, H&H/PLT wnl  12/26 0805: HL 0.18, subtherapeutic 12/26 1715: HL 0.32, therapeutic x1 12/27 0211 HL 0.18, subtherapeutic 12/27 1242 HL 0.42, therapeutic x 1  Goal of Therapy:  Heparin level 0.3-0.7 units/ml Monitor platelets by anticoagulation protocol: Yes   Plan:  ---12/27 1242  HL 0.42, therapeutic x 1 ---Continue heparin infusion rate of 1250 units/hr ---Will check confirmatory heparin level in 8 hours  ---CBC daily while on heparin  Paulita Fujita, PharmD Clinical Pharmacist 12/27/2023 1:17 PM

## 2023-12-27 NOTE — Progress Notes (Signed)
Rounding Note    Patient Name: Brittany Hernandez Date of Encounter: 12/27/2023  [facility] Cardiologist: Humberto Seals  Subjective   Resting comfortably in bed, daughter at the bedside (on the phone) Brittany Hernandez reports that she feels well though indicates she would like to get up out of bed Denies chest pain or shortness of breath Maintaining normal sinus rhythm  Inpatient Medications    Scheduled Meds:  aspirin EC  325 mg Oral Daily   atorvastatin  40 mg Oral Daily   bisoprolol  2.5 mg Oral Daily   doxycycline  100 mg Oral Q12H   furosemide  20 mg Oral Daily   insulin aspart  0-5 Units Subcutaneous QHS   insulin aspart  0-9 Units Subcutaneous TID WC   ipratropium-albuterol  3 mL Nebulization QID   nicotine  21 mg Transdermal Daily   polyethylene glycol  34 g Oral Q2H   topiramate  25 mg Oral QHS   Continuous Infusions:  heparin 1,250 Units/hr (12/27/23 0336)   PRN Meds: acetaminophen, albuterol, dextromethorphan-guaiFENesin, ondansetron (ZOFRAN) IV   Vital Signs    Vitals:   12/27/23 0800 12/27/23 0900 12/27/23 1052 12/27/23 1221  BP: (!) 125/99 (!) 145/90  127/67  Pulse:    73  Resp:   18 20  Temp:   98 F (36.7 C) 98.2 F (36.8 C)  TempSrc:   Oral   SpO2: 97% 93% 96% 96%  Weight:      Height:        Intake/Output Summary (Last 24 hours) at 12/27/2023 1227 Last data filed at 12/27/2023 1141 Gross per 24 hour  Intake 1433.8 ml  Output --  Net 1433.8 ml      12/25/2023   11:00 PM 07/20/2020    5:56 AM 06/16/2020    4:57 PM  Last 3 Weights  Weight (lbs) 136 lb 11 oz 133 lb 135 lb  Weight (kg) 62 kg 60.328 kg 61.236 kg      Telemetry    Normal sinus rhythm- Personally Reviewed  ECG     - Personally Reviewed  Physical Exam   GEN: No acute distress.   Neck: Unable to estimate JVD Cardiac: RRR, no murmurs, rubs, or gallops.  Respiratory: Moderately decreased breath sounds throughout, scattered Rales GI: Soft, nontender,  non-distended  MS: No edema; No deformity. Neuro:  Nonfocal  Psych: Normal affect   Labs    High Sensitivity Troponin:   Recent Labs  Lab 12/25/23 1625 12/25/23 1837 12/25/23 2057 12/26/23 0108 12/26/23 0400  TROPONINIHS 85* 118* 195* 202* 172*     Chemistry Recent Labs  Lab 12/25/23 1625 12/25/23 2057 12/26/23 0400 12/27/23 0211  NA 133*  --  134* 131*  K 5.9* 4.5 4.0 4.0  CL 100  --  103 104  CO2 17*  --  21* 18*  GLUCOSE 201*  --  145* 148*  BUN 56*  --  51* 37*  CREATININE 2.36*  --  1.84* 1.29*  CALCIUM 9.7  --  8.7* 8.4*  MG  --   --  1.8 1.8  PROT 7.3  --   --   --   ALBUMIN 3.3*  --   --   --   AST 29  --   --   --   ALT 10  --   --   --   ALKPHOS 69  --   --   --   BILITOT 1.3*  --   --   --  GFRNONAA 20*  --  27* 42*  ANIONGAP 16*  --  10 9    Lipids  Recent Labs  Lab 12/26/23 0400  CHOL 110  TRIG 97  HDL 39*  LDLCALC 52  CHOLHDL 2.8    Hematology Recent Labs  Lab 12/25/23 1625 12/26/23 0515 12/27/23 0211  WBC 27.9* 22.9* 15.1*  RBC 4.15 3.42* 3.25*  HGB 12.0 9.9* 9.6*  HCT 37.3 31.1* 29.6*  MCV 89.9 90.9 91.1  MCH 28.9 28.9 29.5  MCHC 32.2 31.8 32.4  RDW 16.0* 16.2* 16.2*  PLT 216 213 197   Thyroid  Recent Labs  Lab 12/25/23 2057  TSH 3.259  FREET4 1.09    BNPNo results for input(s): "BNP", "PROBNP" in the last 168 hours.  DDimer No results for input(s): "DDIMER" in the last 168 hours.   Radiology    ECHOCARDIOGRAM COMPLETE Result Date: 12/27/2023    ECHOCARDIOGRAM REPORT   Patient Name:   Brittany Hernandez Date of Exam: 12/27/2023 Medical Rec #:  [id number]      Height:       61.0 in Accession #:    [id number]     Weight:       136.7 lb Date of Birth:  10-Nov-1941      BSA:          1.607 m Patient Age:    82 years       BP:           110/73 mmHg Patient Gender: F              HR:           66 bpm. Exam Location:  [facility] Procedure: 2D Echo, Cardiac Doppler and Color Doppler Indications:     Atrial Fibrillation I48.91  History:          Patient has no prior history of Echocardiogram examinations.                  Risk Factors:Hypertension and Diabetes.  Sonographer:     Cristela Blue Referring Phys:  [id number] Antonieta Iba Diagnosing Phys: Julien Nordmann MD IMPRESSIONS  1. Left ventricular ejection fraction, by estimation, is 55 to 60%. The left ventricle has normal function. The left ventricle has no regional wall motion abnormalities. There is moderate left ventricular hypertrophy. Left ventricular diastolic parameters are consistent with Grade I diastolic dysfunction (impaired relaxation). There is the interventricular septum is flattened in systole and diastole, consistent with right ventricular pressure and volume overload.  2. Right ventricular systolic function is normal. The right ventricular size is normal. There is severely elevated pulmonary artery systolic pressure. The estimated right ventricular systolic pressure is 73.6 mmHg.  3. Right atrial size was moderately dilated.  4. The mitral valve is normal in structure. No evidence of mitral valve regurgitation. No evidence of mitral stenosis.  5. Tricuspid valve regurgitation is moderate to severe.  6. The aortic valve is normal in structure. Aortic valve regurgitation is mild. Aortic valve sclerosis/calcification is present, without any evidence of aortic stenosis.  7. The inferior vena cava is normal in size with greater than 50% respiratory variability, suggesting right atrial pressure of 3 mmHg. FINDINGS  Left Ventricle: Left ventricular ejection fraction, by estimation, is 55 to 60%. The left ventricle has normal function. The left ventricle has no regional wall motion abnormalities. The left ventricular internal cavity size was normal in size. There is  moderate left ventricular hypertrophy. The interventricular septum is flattened in  systole and diastole, consistent with right ventricular pressure and volume overload. Left ventricular diastolic parameters are consistent with  Grade I diastolic dysfunction (impaired relaxation). Right Ventricle: The right ventricular size is normal. No increase in right ventricular wall thickness. Right ventricular systolic function is normal. There is severely elevated pulmonary artery systolic pressure. The tricuspid regurgitant velocity is 4.14 m/s, and with an assumed right atrial pressure of 5 mmHg, the estimated right ventricular systolic pressure is 73.6 mmHg. Left Atrium: Left atrial size was normal in size. Right Atrium: Right atrial size was moderately dilated. Pericardium: There is no evidence of pericardial effusion. Mitral Valve: The mitral valve is normal in structure. No evidence of mitral valve regurgitation. No evidence of mitral valve stenosis. MV peak gradient, 7.0 mmHg. The mean mitral valve gradient is 3.0 mmHg. Tricuspid Valve: The tricuspid valve is normal in structure. Tricuspid valve regurgitation is moderate to severe. No evidence of tricuspid stenosis. Aortic Valve: The aortic valve is normal in structure. Aortic valve regurgitation is mild. Aortic valve sclerosis/calcification is present, without any evidence of aortic stenosis. Aortic valve mean gradient measures 3.0 mmHg. Aortic valve peak gradient measures 5.8 mmHg. Aortic valve area, by VTI measures 2.55 cm. Pulmonic Valve: The pulmonic valve was normal in structure. Pulmonic valve regurgitation is mild. No evidence of pulmonic stenosis. Aorta: The aortic root is normal in size and structure. Venous: The inferior vena cava is normal in size with greater than 50% respiratory variability, suggesting right atrial pressure of 3 mmHg. IAS/Shunts: No atrial level shunt detected by color flow Doppler.  LEFT VENTRICLE PLAX 2D LVIDd:         2.90 cm   Diastology LVIDs:         1.50 cm   LV e' medial:    6.96 cm/s LV PW:         1.00 cm   LV E/e' medial:  14.5 LV IVS:        1.80 cm   LV e' lateral:   13.20 cm/s LVOT diam:     2.00 cm   LV E/e' lateral: 7.7 LV SV:         58 LV SV  Index:   36 LVOT Area:     3.14 cm  RIGHT VENTRICLE RV Basal diam:  4.30 cm RV Mid diam:    3.30 cm RV S prime:     15.00 cm/s LEFT ATRIUM             Index        RIGHT ATRIUM           Index LA diam:        3.30 cm 2.05 cm/m   RA Area:     24.70 cm LA Vol (A2C):   69.4 ml 43.20 ml/m  RA Volume:   87.90 ml  54.71 ml/m LA Vol (A4C):   59.2 ml 36.85 ml/m LA Biplane Vol: 66.0 ml 41.08 ml/m  AORTIC VALVE                    PULMONIC VALVE AV Area (Vmax):    2.45 cm     PR End Diast Vel: 5.95 msec AV Area (Vmean):   2.55 cm AV Area (VTI):     2.55 cm AV Vmax:           120.00 cm/s AV Vmean:          75.800 cm/s AV VTI:  0.228 m AV Peak Grad:      5.8 mmHg AV Mean Grad:      3.0 mmHg LVOT Vmax:         93.40 cm/s LVOT Vmean:        61.500 cm/s LVOT VTI:          0.185 m LVOT/AV VTI ratio: 0.81  AORTA Ao Root diam: 2.80 cm MITRAL VALVE                TRICUSPID VALVE MV Area (PHT): 3.08 cm     TR Peak grad:   68.6 mmHg MV Area VTI:   1.39 cm     TR Vmax:        414.00 cm/s MV Peak grad:  7.0 mmHg MV Mean grad:  3.0 mmHg     SHUNTS MV Vmax:       1.32 m/s     Systemic VTI:  0.18 m MV Vmean:      73.8 cm/s    Systemic Diam: 2.00 cm MV Decel Time: 246 msec MV E velocity: 101.00 cm/s MV A velocity: 108.00 cm/s MV E/A ratio:  0.94 Julien Nordmann MD Electronically signed by Julien Nordmann MD Signature Date/Time: 12/27/2023/12:14:47 PM    Final    CT ABDOMEN PELVIS WO CONTRAST Result Date: 12/25/2023 CLINICAL DATA:  Abdominal pain, acute, nonlocalized EXAM: CT ABDOMEN AND PELVIS WITHOUT CONTRAST TECHNIQUE: Multidetector CT imaging of the abdomen and pelvis was performed following the standard protocol without IV contrast. RADIATION DOSE REDUCTION: This exam was performed according to the departmental dose-optimization program which includes automated exposure control, adjustment of the mA and/or kV according to patient size and/or use of iterative reconstruction technique. COMPARISON:  None Available.  FINDINGS: Lower chest: There is a large hiatal hernia containing stomach as well as transverse colon and small bowel loops. Much of the hernia is in the right lower chest. Airspace disease in the left lower lobe concerning for pneumonia. No effusions. Coronary artery and aortic atherosclerosis. Hepatobiliary: No focal liver abnormality is seen. Status post cholecystectomy. No biliary dilatation. Pancreas: No focal abnormality or ductal dilatation. Spleen: No focal abnormality.  Normal size. Adrenals/Urinary Tract: Adrenal glands normal. Bilateral renal low-density lesions, many of which appear to reflect cysts. Indeterminate lesion in the upper pole of the right kidney measures up to 2.9 cm. 1.7 cm indeterminate lesion in the midpole of the left kidney. No hydronephrosis. Urinary bladder not visualized with surgical clips in the pelvis, likely prior cystectomy. Right lower quadrant urostomy noted. Stomach/Bowel: No bowel obstruction or inflammatory process. Sigmoid diverticulosis. Vascular/Lymphatic: Aortoiliac atherosclerosis. No evidence of aneurysm or adenopathy. Reproductive: Prior hysterectomy.  No adnexal masses. Other: No free fluid or free air. Musculoskeletal: No acute bony abnormality. IMPRESSION: Large hiatal hernia containing stomach, transverse colon and small bowel loops. Left lower lobe airspace disease concerning for pneumonia. Aortic atherosclerosis, coronary artery disease. Multiple low-density lesions in the kidneys, most of which appear to reflect cysts. Indeterminate right upper pole and left midpole lesions noted which are not definitively cystic. These could be further evaluated with elective renal ultrasound. Electronically Signed   By: Charlett Nose M.D.   On: 12/25/2023 19:38   DG Chest Port 1 View Result Date: 12/25/2023 CLINICAL DATA:  Question of sepsis to evaluate for abnormality. Shortness of breath, dark urine, low oxygen saturation. History of COPD. EXAM: PORTABLE CHEST 1 VIEW  COMPARISON:  06/16/2020 FINDINGS: Shallow inspiration with elevation of right hemidiaphragm versus right diaphragmatic hernia. Gas in the  right hemidiaphragm likely represents colonic interposition. This is unchanged since prior study. Suggestion of small developing pleural effusions with basilar atelectasis or infiltration. Possible pneumonia versus compressive atelectasis. No pneumothorax. Heart size and pulmonary vascularity are normal for technique. Calcification of the aorta. Degenerative changes in the right shoulder. Postoperative change in the left shoulder. IMPRESSION: 1. 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 2. Developing small pleural effusions with basilar atelectasis or infiltration. Electronically Signed   By: Burman Nieves M.D.   On: 12/25/2023 17:13    Cardiac Studies   Echo  1. Left ventricular ejection fraction, by estimation, is 55 to 60%. The  left ventricle has normal function. The left ventricle has no regional  wall motion abnormalities. There is moderate left ventricular hypertrophy.  Left ventricular diastolic  parameters are consistent with Grade I diastolic dysfunction (impaired  relaxation). There is the interventricular septum is flattened in systole  and diastole, consistent with right ventricular pressure and volume  overload.   2. Right ventricular systolic function is normal. The right ventricular  size is normal. There is severely elevated pulmonary artery systolic  pressure. The estimated right ventricular systolic pressure is 73.6 mmHg.   3. Right atrial size was moderately dilated.   4. The mitral valve is normal in structure. No evidence of mitral valve  regurgitation. No evidence of mitral stenosis.   5. Tricuspid valve regurgitation is moderate to severe.   6. The aortic valve is normal in structure. Aortic valve regurgitation is  mild. Aortic valve  sclerosis/calcification is present, without any  evidence of aortic stenosis.   7. The inferior vena cava is normal in size with greater than 50%  respiratory variability, suggesting right atrial pressure of 3 mmHg.   Patient Profile     Brittany Hernandez is a 82 y.o. female with a hx of bladder cancer s/p bladder removal(with ileal conduit with urostomy), HTN, type II diabetes, COPD, CAD, schizophrenia, depression with psychosis, CKD-3A, recurrent UTI, who is being seen 12/26/2023 for the evaluation of elevated troponins and new onset atrial fibrillation   Assessment & Plan    Atrial fibrillation/flutter with RVR Noted on arrival in the setting of pneumonia, UTI/sepsis with hypotension Rate improved on IV fluids, converting back to normal sinus rhythm On heparin infusion Markedly elevated CHA2DS2-VASc of at least 6 Normal TSH -On discussion with daughter yesterday, she indicated she did not want her on anticoagulation such as Eliquis -Will recommend Zio monitor at discharge, this could be sent to her for further outpatient monitoring -Patient takes aspirin and Goody powders on a daily basis increasing her risk of bleeding -Recommend increasing bisoprolol up to 5 mg daily   Elevated troponin Peak of 200 now trending downward Likely supply/demand mismatch in the setting of atrial fibrillation with RVR, pneumonia, UTI, hypotension -No current plans for ischemic workup given age, debility, renal failure -Echocardiogram with normal LV function, LVH   Acute on chronic renal disease stage IIIa Creatinine 2.36 on arrival, received IV fluids, creatinine trending down to baseline level-creatinine today 1.29  Hypertension ARB on hold in the setting of renal failure On bisoprolol up to 5 mg tomorrow   COPD exacerbation with emphysema Long history of smoking Cessation recommended -PPI in the setting of large hiatal hernia, high risk for aspiration   Type 2 diabetes A1c 6.8 on  insulin  Pulmonary hypertension In the setting of underlying lung disease, relatively asymptomatic -With elevated renal function on arrival,  not a good candidate for Lasix on a daily basis -Will recommend outpatient management   [facility] will sign off.   Medication Recommendations: Bisoprolol up to 5 mg daily Other recommendations (labs, testing, etc): Zio monitor could be ordered at discharge Follow up as an outpatient: Arrange outpatient follow-up in clinic   For questions or updates, please contact [facility] Please consult [email or web address] for contact info under        Signed, Julien Nordmann, MD  12/27/2023, 12:27 PM

## 2023-12-27 NOTE — Progress Notes (Signed)
*  PRELIMINARY RESULTS* Echocardiogram 2D Echocardiogram has been performed.  Cristela Blue 12/27/2023, 7:58 AM

## 2023-12-27 NOTE — Progress Notes (Signed)
PROGRESS NOTE    Brittany Hernandez  ZOX:[id number] DOB: 1941-11-18 DOA: 12/25/2023 PCP: Evette Cristal, MD  117A/[id number]-AA  LOS: 2 days   Brief hospital course:   Assessment & Plan: Brittany Hernandez is a 82 y.o. female with medical history significant of bladder cancer (s/p bladder removal w/ ileal-conduit with urostomy), HTN, DM, COPD, CAD, schizophrenia, depression with psychosis, CKD-3A, UTI, PVC, tobacco abuse, who presents with weakness and cough with brownish colored sputum production.    Severe sepsis [facility]): Patient has severe sepsis with WBC 27.9, heart rate up to 136, RR 25.  Lactic acid 2.8 --> 2.3.  This is likely due to combination of CAP and UTI.  Patient was initially hypotensive with blood pressure 77/66, which improved return 138/81 after giving 3 L of LR bolus in ED. --patient received 1 dose of vancomycin, cefepime and Flagyl in ED   CAP (community acquired pneumonia):  --CT a/p showed Left lower lobe airspace disease concerning for pneumonia.  Leukocytosis, procal 14.97. --patient received 1 dose of vancomycin, cefepime and Flagyl in ED --cont ceftriaxone and doxy   UTI (urinary tract infection) ileal-conduit with urostomy --UA mod leuk and many bacteria --cont ceftriaxone   Trop elevation 2/2 demand ischemia NSTEMI, ruled out --trop peaked at 202  hx of CAD:  -cont ASA and statin   New onset atrial fibrillation w RVR 136 --> 70s now. CHADS2 score is 6.  -started IV heparin --daughter prefers not to have her mother on anticoagulation unless additional atrial fibrillation can be documented  --increase bisoprolol to 5 mg daily --d/c heparin gtt --tele now, Zio monitor at discharge   HTN (hypertension) --BP low initially --hold losartan and lasix   COPD (chronic obstructive pulmonary disease) with emphysema (HCC) --DuoNeb   Type II diabetes mellitus with renal manifestations (HCC): Recent A1c 6.8, well-controlled.  Patient is taking metformin --ACHS and  SSI   Acute renal failure superimposed on stage 3a chronic kidney disease (HCC): Likely multifactorial etiology, including UTI, continuation of Cozaar and Lasix, ATN is also possible due to hypotension.   --Cr 2.36 on presentation, improved to 1.29 this morning after MIVF. --oral hydration   Severe recurrent depression with psychosis (HCC) and Schizoaffective disorder (HCC): pt is calm now --cont Topamax   Kidney lesion: CT showed multiple low-density lesions in the kidneys, most of which appear to reflect cysts. Indeterminate right upper pole and left midpole lesions noted which are not definitively cystic.  -need to f/u with PCP or urology for outpt work up.   DVT prophylaxis: Heparin SQ Code Status: Full code  Family Communication: daughter updated on the phone today Level of care: Med-Surg Dispo:   The patient is from: home Anticipated d/c is to: home Anticipated d/c date is: 1-2 days   Subjective and Interval History:  No abdominal pain.  Family reported pt constipated.   Objective: Vitals:   12/27/23 0900 12/27/23 1052 12/27/23 1221 12/27/23 1237  BP: (!) 145/90  127/67   Pulse:   73   Resp:  18 20   Temp:  98 F (36.7 C) 98.2 F (36.8 C)   TempSrc:  Oral    SpO2: 93% 96% 96% 96%  Weight:      Height:        Intake/Output Summary (Last 24 hours) at 12/27/2023 2009 Last data filed at 12/27/2023 1900 Gross per 24 hour  Intake 1333.8 ml  Output 800 ml  Net 533.8 ml   Filed Weights   12/25/23 2300  Weight: 62 kg    Examination:   Constitutional: NAD, AAOx3 HEENT: conjunctivae and lids normal, EOMI CV: No cyanosis.   RESP: normal respiratory effort, on RA Neuro: II - XII grossly intact.    urostomy present   Data Reviewed: I have personally reviewed labs and imaging studies  Time spent: 35 minutes  Darlin Priestly, MD [facility] If 7PM-7AM, please contact night-coverage 12/27/2023, 8:09 PM

## 2023-12-28 DIAGNOSIS — N179 Acute kidney failure, unspecified: Secondary | ICD-10-CM | POA: Diagnosis not present

## 2023-12-28 LAB — CBC
HCT: 31.6 % — ABNORMAL LOW (ref 36.0–46.0)
Hemoglobin: 10.2 g/dL — ABNORMAL LOW (ref 12.0–15.0)
MCH: 28.4 pg (ref 26.0–34.0)
MCHC: 32.3 g/dL (ref 30.0–36.0)
MCV: 88 fL (ref 80.0–100.0)
Platelets: 254 10*3/uL (ref 150–400)
RBC: 3.59 MIL/uL — ABNORMAL LOW (ref 3.87–5.11)
RDW: 16.3 % — ABNORMAL HIGH (ref 11.5–15.5)
WBC: 10.1 10*3/uL (ref 4.0–10.5)
nRBC: 0 % (ref 0.0–0.2)

## 2023-12-28 LAB — BASIC METABOLIC PANEL
Anion gap: 8 (ref 5–15)
BUN: 27 mg/dL — ABNORMAL HIGH (ref 8–23)
CO2: 20 mmol/L — ABNORMAL LOW (ref 22–32)
Calcium: 8.8 mg/dL — ABNORMAL LOW (ref 8.9–10.3)
Chloride: 107 mmol/L (ref 98–111)
Creatinine, Ser: 1.06 mg/dL — ABNORMAL HIGH (ref 0.44–1.00)
GFR, Estimated: 53 mL/min — ABNORMAL LOW (ref >60)
Glucose, Bld: 158 mg/dL — ABNORMAL HIGH (ref 70–99)
Potassium: 4.5 mmol/L (ref 3.5–5.1)
Sodium: 135 mmol/L (ref 135–145)

## 2023-12-28 LAB — URINE CULTURE: Culture: >=100000 — AB

## 2023-12-28 LAB — MAGNESIUM: Magnesium: 2.2 mg/dL (ref 1.7–2.4)

## 2023-12-28 LAB — GLUCOSE, CAPILLARY
Glucose-Capillary: 129 mg/dL — ABNORMAL HIGH (ref 70–99)
Glucose-Capillary: 149 mg/dL — ABNORMAL HIGH (ref 70–99)

## 2023-12-28 MED ORDER — IPRATROPIUM-ALBUTEROL 0.5-2.5 (3) MG/3ML IN SOLN
3.0000 mL | Freq: Three times a day (TID) | RESPIRATORY_TRACT | Status: DC
Start: 2023-12-28 — End: 2023-12-31
  Administered 2023-12-28 – 2023-12-31 (×7): 3 mL via RESPIRATORY_TRACT
  Filled 2023-12-28 (×9): qty 3

## 2023-12-28 MED ORDER — SODIUM CHLORIDE 3 % IN NEBU
4.0000 mL | INHALATION_SOLUTION | Freq: Two times a day (BID) | RESPIRATORY_TRACT | Status: AC
  Administered 2023-12-28 – 2023-12-30 (×6): 4 mL via RESPIRATORY_TRACT
  Filled 2023-12-28 (×6): qty 4

## 2023-12-28 NOTE — Evaluation (Signed)
Physical Therapy Evaluation Patient Details Name: Brittany Hernandez MRN: [id number] DOB: Mar 15, 1941 Today's Date: 12/28/2023  History of Present Illness  82 y/o female presented to ED on 12/24/53 for weakness and abdominal pain. Admitted for severe sepsis likely due to combination of CAP and UTI. PMH: CKD-3A, schizophrenia, depression, COPD, HTN, DM, CAD, hx of bladder cancer (s/p bladder removal w/ urostomy)  Clinical Impression  Patient admitted with the above. PTA, patient lives with daughter and reports being modI with RW throughout the house and SPC outdoors. Daughter assists with bathing to be able to reach her back. Patient presents with weakness, impaired balance, and decreased activity tolerance. Patient required minA to stand from recliner and ambulated within room with CGA and RW. SpO2 dropped to 85% on RA with increase back to 89% within 10-15 seconds. Educated patient on use of incentive spirometer with patient intermittently demonstrating follow through. Patient will benefit from skilled PT services during acute stay to address listed deficits. Patient will benefit from ongoing therapy at discharge to maximize functional independence and safety.         If plan is discharge home, recommend the following: A little help with walking and/or transfers;A little help with bathing/dressing/bathroom;Assistance with cooking/housework;Assist for transportation;Help with stairs or ramp for entrance   Can travel by private vehicle        Equipment Recommendations None recommended by PT  Recommendations for Other Services       Functional Status Assessment Patient has had a recent decline in their functional status and demonstrates the ability to make significant improvements in function in a reasonable and predictable amount of time.     Precautions / Restrictions Precautions Precautions: Fall Precaution Comments: watch O2 Restrictions Weight Bearing Restrictions Per Provider Order: No       Mobility  Bed Mobility               General bed mobility comments: in recliner at beginning and end of session    Transfers Overall transfer level: Needs assistance Equipment used: Rolling Sherwin Hollingshed (2 wheels) Transfers: Sit to/from Stand Sit to Stand: Min assist           General transfer comment: assist to boost into standing    Ambulation/Gait Ambulation/Gait assistance: Contact guard assist Gait Distance (Feet): 20 Feet Assistive device: Rolling Jaquell Seddon (2 wheels) Gait Pattern/deviations: Step-through pattern, Decreased stride length Gait velocity: decreased     General Gait Details: trunk flexed throughout. CGA for safety. 2/4 DOE with drop in spO2 to 85% once in sitting  Stairs            Wheelchair Mobility     Tilt Bed    Modified Rankin (Stroke Patients Only)       Balance Overall balance assessment: Needs assistance Sitting-balance support: No upper extremity supported, Feet supported Sitting balance-Leahy Scale: Good     Standing balance support: Bilateral upper extremity supported, During functional activity Standing balance-Leahy Scale: Fair                               Pertinent Vitals/Pain Pain Assessment Pain Assessment: Faces Faces Pain Scale: No hurt Pain Intervention(s): Monitored during session    Home Living Family/patient expects to be discharged to:: Private residence Living Arrangements: Children Available Help at Discharge: Family;Available 24 hours/day Type of Home: House Home Access: Stairs to enter;Ramped entrance Entrance Stairs-Rails: Left;Right;Can reach both Entrance Stairs-Number of Steps: 3   Home Layout: One  level Home Equipment: Agricultural consultant (2 wheels);Cane - single point      Prior Function Prior Level of Function : Needs assist             Mobility Comments: walks independently with RW in the home and [facility] outside ADLs Comments: needs assista from daughter for washing  her back     Extremity/Trunk Assessment   Upper Extremity Assessment Upper Extremity Assessment: Defer to OT evaluation (hx of rotator cuff repair)    Lower Extremity Assessment Lower Extremity Assessment: Generalized weakness    Cervical / Trunk Assessment Cervical / Trunk Assessment: Kyphotic  Communication   Communication Communication: No apparent difficulties  Cognition Arousal: Alert Behavior During Therapy: WFL for tasks assessed/performed Overall Cognitive Status: Within Functional Limits for tasks assessed                                          General Comments      Exercises     Assessment/Plan    PT Assessment Patient needs continued PT services  PT Problem List Decreased strength;Decreased balance;Decreased activity tolerance;Decreased mobility;Decreased knowledge of use of DME;Decreased safety awareness;Decreased knowledge of precautions;Cardiopulmonary status limiting activity       PT Treatment Interventions DME instruction;Gait training;Stair training;Functional mobility training;Therapeutic activities;Therapeutic exercise;Balance training;Patient/family education    PT Goals (Current goals can be found in the Care Plan section)  Acute Rehab PT Goals Patient Stated Goal: to get stronger PT Goal Formulation: With patient/family Time For Goal Achievement: 01/11/24 Potential to Achieve Goals: Good    Frequency Min 1X/week     Co-evaluation               AM-PAC PT "6 Clicks" Mobility  Outcome Measure Help needed turning from your back to your side while in a flat bed without using bedrails?: A Little Help needed moving from lying on your back to sitting on the side of a flat bed without using bedrails?: A Little Help needed moving to and from a bed to a chair (including a wheelchair)?: A Little Help needed standing up from a chair using your arms (e.g., wheelchair or bedside chair)?: A Little Help needed to walk in  hospital room?: A Little Help needed climbing 3-5 steps with a railing? : A Little 6 Click Score: 18    End of Session   Activity Tolerance: Patient tolerated treatment well Patient left: in chair;with call bell/phone within reach;with chair alarm set;with family/visitor present Nurse Communication: Mobility status PT Visit Diagnosis: Unsteadiness on feet (R26.81);Muscle weakness (generalized) (M62.81);Other abnormalities of gait and mobility (R26.89)    Time: 8119-1478 PT Time Calculation (min) (ACUTE ONLY): 27 min   Charges:   PT Evaluation $PT Eval Moderate Complexity: 1 Mod PT Treatments $Therapeutic Activity: 8-22 mins PT General Charges $$ ACUTE PT VISIT: 1 Visit         Maylon Peppers, PT, DPT Physical Therapist - [facility]  [facility]   Christean Silvestri A Donnavan Covault 12/28/2023, 3:13 PM

## 2023-12-28 NOTE — Plan of Care (Signed)
  Problem: Education: Goal: Ability to describe self-care measures that may prevent or decrease complications (Diabetes Survival Skills Education) will improve Outcome: Progressing Goal: Individualized Educational Video(s) Outcome: Progressing   Problem: Coping: Goal: Ability to adjust to condition or change in health will improve Outcome: Progressing   Problem: Fluid Volume: Goal: Ability to maintain a balanced intake and output will improve Outcome: Progressing   Problem: Health Behavior/Discharge Planning: Goal: Ability to identify and utilize available resources and services will improve Outcome: Progressing Goal: Ability to manage health-related needs will improve Outcome: Progressing   Problem: Metabolic: Goal: Ability to maintain appropriate glucose levels will improve Outcome: Progressing   Problem: Nutritional: Goal: Maintenance of adequate nutrition will improve Outcome: Progressing Goal: Progress toward achieving an optimal weight will improve Outcome: Progressing   Problem: Skin Integrity: Goal: Risk for impaired skin integrity will decrease Outcome: Progressing   Problem: Tissue Perfusion: Goal: Adequacy of tissue perfusion will improve Outcome: Progressing   Problem: Education: Goal: Knowledge of General Education information will improve Description: Including pain rating scale, medication(s)/side effects and non-pharmacologic comfort measures Outcome: Progressing   Problem: Health Behavior/Discharge Planning: Goal: Ability to manage health-related needs will improve Outcome: Progressing   Problem: Clinical Measurements: Goal: Ability to maintain clinical measurements within normal limits will improve Outcome: Progressing Goal: Will remain free from infection Outcome: Progressing Goal: Diagnostic test results will improve Outcome: Progressing Goal: Respiratory complications will improve Outcome: Progressing Goal: Cardiovascular complication will  be avoided Outcome: Progressing   Problem: Activity: Goal: Risk for activity intolerance will decrease Outcome: Progressing   Problem: Nutrition: Goal: Adequate nutrition will be maintained Outcome: Progressing   Problem: Coping: Goal: Level of anxiety will decrease Outcome: Progressing   Problem: Elimination: Goal: Will not experience complications related to bowel motility Outcome: Progressing Goal: Will not experience complications related to urinary retention Outcome: Progressing

## 2023-12-28 NOTE — TOC Initial Note (Signed)
Transition of Care [facility]) - Initial/Assessment Note    Patient Details  Name: Brittany Hernandez MRN: [id number] Date of Birth: 1941-06-15  Transition of Care [facility]) CM/SW Contact:    Rodney Langton, RN Phone Number: 12/28/2023, 4:52 PM  Clinical Narrative:                  Patient admitted from home, lives with daughter.  Dr. Gwenlyn Fudge is PCP, does not have DME in the home.  Daughter agrees to have home health PT as recommended, does not have preference.  Referral accepted by Elnita Maxwell with [facility].   Expected Discharge Plan: Home w Home Health Services Barriers to Discharge: Continued Medical Work up   Patient Goals and CMS Choice Patient states their goals for this hospitalization and ongoing recovery are:: Home with [facility] services CMS Medicare.gov Compare Post Acute Care list provided to:: Patient Represenative (must comment) Choice offered to / list presented to : Adult Children      Expected Discharge Plan and Services     Post Acute Care Choice: Home Health Living arrangements for the past 2 months: Single Family Home                           HH Arranged: PT HH Agency: [facility] Date [facility] Agency Contacted: 12/28/23 Time HH Agency Contacted: [phone] Representative spoke with at [facility] Agency: Elnita Maxwell  Prior Living Arrangements/Services Living arrangements for the past 2 months: Single Family Home Lives with:: Adult Children Patient language and need for interpreter reviewed:: Yes Do you feel safe going back to the place where you live?: Yes      Need for Family Participation in Patient Care: Yes (Comment) Care giver support system in place?: Yes (comment)   Criminal Activity/Legal Involvement Pertinent to Current Situation/Hospitalization: No - Comment as needed  Activities of Daily Living      Permission Sought/Granted Permission sought to share information with : Case Manager Permission granted to share information with : Yes, Verbal Permission  Granted  Share Information with NAME: Maxine Glenn  Permission granted to share info w AGENCY: [facility]        Emotional Assessment       Orientation: : Oriented to Self, Oriented to Place   Psych Involvement: No (comment)  Admission diagnosis:  CAP (community acquired pneumonia) [J18.9] Severe sepsis (HCC) [A41.9, R65.20] Urinary tract infection without hematuria, site unspecified [N39.0] Sepsis with acute organ dysfunction, due to unspecified organism, unspecified organ dysfunction type, unspecified whether septic shock present (HCC) [A41.9, R65.20] Patient Active Problem List   Diagnosis Date Noted   Pulmonary hypertension, unspecified (HCC) 12/27/2023   Sepsis with acute organ dysfunction (HCC) 12/25/2023   Type II diabetes mellitus with renal manifestations (HCC) 12/25/2023   Acute renal failure superimposed on stage 3a chronic kidney disease (HCC) 12/25/2023   NSTEMI (non-ST elevated myocardial infarction) (HCC) 12/25/2023   UTI (urinary tract infection) 12/25/2023   CAP (community acquired pneumonia) 12/25/2023   New onset atrial fibrillation (HCC) 12/25/2023   Kidney lesion 12/25/2023   CAD (coronary artery disease) 12/25/2023   COPD (chronic obstructive pulmonary disease) with emphysema (HCC) 05/08/2015   Severe recurrent depression with psychosis (HCC) 05/08/2015   Schizoaffective disorder (HCC) 05/08/2015   HTN (hypertension) 05/05/2015   DM (diabetes mellitus), type 2 with neurological complications (HCC) 05/05/2015   PCP:  Evette Cristal, MD Pharmacy:   [facility] [phone] - [place], [place] - [address] AT  [facility] [address] [place] [address] [place] [address] Phone: [phone] Fax: [phone]     Social Drivers of Health (SDOH) Social History: SDOH Screenings   Food Insecurity: No Food Insecurity (12/28/2023)  Housing: Low Risk  (12/28/2023)  Transportation Needs: Unknown (12/28/2023)  Utilities: Not At Risk (12/28/2023)   Financial Resource Strain: Low Risk  (12/11/2023)   Received from [facility]  Physical Activity: Inactive (08/12/2023)   Received [facility]  Social Connections: Moderately Isolated (08/12/2023)   Received from [facility]  Stress: Stress Concern Present (08/12/2023)   Received from [facility]  Tobacco Use: High Risk (12/25/2023)  Health Literacy: Low Risk  (04/06/2021)   Received from [facility]   SDOH Interventions:     Readmission Risk Interventions     No data to display

## 2023-12-28 NOTE — Progress Notes (Signed)
PROGRESS NOTE    Brittany Hernandez  WUJ:[id number] DOB: [birth year] DOA: 12/25/2023 PCP: Evette Cristal, MD  117A/117A-AA  LOS: 3 days   Brief hospital course:   Assessment & Plan: Brittany Hernandez is a 82 y.o. female with medical history significant of bladder cancer (s/p bladder removal w/ ileal-conduit with urostomy), HTN, DM, COPD, CAD, schizophrenia, depression with psychosis, CKD-3A, UTI, PVC, tobacco abuse, who presents with weakness and cough with brownish colored sputum production.    Severe sepsis [facility]): Patient has severe sepsis with WBC 27.9, heart rate up to 136, RR 25.  Lactic acid 2.8 --> 2.3.  This is likely due to combination of CAP and UTI.  Patient was initially hypotensive with blood pressure 77/66, which improved return 138/81 after giving 3 L of LR bolus in ED. --patient received 1 dose of vancomycin, cefepime and Flagyl in ED   CAP (community acquired pneumonia):  --CT a/p showed Left lower lobe airspace disease concerning for pneumonia.  Leukocytosis, procal 14.97. --patient received 1 dose of vancomycin, cefepime and Flagyl in ED --cont ceftriaxone and doxy for 5 days total   Presumed UTI (urinary tract infection)  ileal-conduit with urostomy --UA mod leuk and many bacteria.  Pt can not experience dysuria, so was treated as UTI. --urine cx pos for pan-sensitive E coli --completed 3 days of ceftriaxone   Trop elevation 2/2 demand ischemia NSTEMI, ruled out --trop peaked at 202  hx of CAD:  -cont ASA and statin   New onset atrial fibrillation w RVR 136 --> 70s now. CHADS2 score is 6.  -started IV heparin --daughter prefers not to have her mother on anticoagulation unless additional atrial fibrillation can be documented.  Daughter also refused bisoprolol ordered by cardio. --d/c'ed heparin gtt --tele monitor, Zio patch at discharge   HTN (hypertension) --BP low initially --hold losartan due to AKI --lasix resumed by cardio   COPD (chronic obstructive  pulmonary disease) with emphysema (HCC) --DuoNeb --3% saline neb BID to clear mucus --IS and flutter valve   Type II diabetes mellitus with renal manifestations (HCC): Recent A1c 6.8, well-controlled.  Patient is taking metformin --ACHS and SSI   Acute renal failure superimposed on stage 3a chronic kidney disease (HCC): Likely multifactorial etiology, including UTI, continuation of Cozaar and Lasix, ATN is also possible due to hypotension.   --Cr 2.36 on presentation, improved to 1.06 this morning after MIVF. --oral hydration   Severe recurrent depression with psychosis (HCC) and Schizoaffective disorder (HCC): pt is calm now --cont Topamax   Kidney lesion: CT showed multiple low-density lesions in the kidneys, most of which appear to reflect cysts. Indeterminate right upper pole and left midpole lesions noted which are not definitively cystic.  -need to f/u with PCP or urology for outpt work up.   DVT prophylaxis: Heparin SQ Code Status: Full code  Family Communication: daughter updated at bedside today Level of care: Med-Surg Dispo:   The patient is from: home Anticipated d/c is to: home Anticipated d/c date is: tomorrow   Subjective and Interval History:  Per daughter, pt was having some hallucinations.  Daughter complained that no wet wipes available on the floor for cleaning pt's rectal area, and that towels are too rough for the skin; daughter wanted this complaint to be documented by MD.   Objective: Vitals:   12/28/23 0605 12/28/23 0759 12/28/23 1304 12/28/23 1607  BP:  (!) 125/58  (!) 153/78  Pulse: 77 61  83  Resp:  19  19  Temp:  98.9 F (37.2 C)  98.5 F (36.9 C)  TempSrc:  Oral    SpO2: 92% 94% 92% 98%  Weight:      Height:        Intake/Output Summary (Last 24 hours) at 12/28/2023 1845 Last data filed at 12/28/2023 1538 Gross per 24 hour  Intake 100 ml  Output 1550 ml  Net -1450 ml   Filed Weights   12/25/23 2300  Weight: 62 kg    Examination:    Constitutional: NAD, alert, oriented HEENT: conjunctivae and lids normal, EOMI CV: No cyanosis.   RESP: normal respiratory effort, on RA Neuro: II - XII grossly intact.    urostomy present   Data Reviewed: I have personally reviewed labs and imaging studies  Time spent: 35 minutes  Darlin Priestly, MD [facility] If 7PM-7AM, please contact night-coverage 12/28/2023, 6:45 PM

## 2023-12-29 DIAGNOSIS — N179 Acute kidney failure, unspecified: Secondary | ICD-10-CM | POA: Diagnosis not present

## 2023-12-29 LAB — BASIC METABOLIC PANEL
Anion gap: 9 (ref 5–15)
BUN: 21 mg/dL (ref 8–23)
CO2: 22 mmol/L (ref 22–32)
Calcium: 9.5 mg/dL (ref 8.9–10.3)
Chloride: 108 mmol/L (ref 98–111)
Creatinine, Ser: 1.03 mg/dL — ABNORMAL HIGH (ref 0.44–1.00)
GFR, Estimated: 55 mL/min — ABNORMAL LOW (ref >60)
Glucose, Bld: 142 mg/dL — ABNORMAL HIGH (ref 70–99)
Potassium: 4.4 mmol/L (ref 3.5–5.1)
Sodium: 139 mmol/L (ref 135–145)

## 2023-12-29 LAB — CULTURE, RESPIRATORY W GRAM STAIN: Culture: NORMAL

## 2023-12-29 LAB — CBC
HCT: 30.6 % — ABNORMAL LOW (ref 36.0–46.0)
Hemoglobin: 9.9 g/dL — ABNORMAL LOW (ref 12.0–15.0)
MCH: 29.1 pg (ref 26.0–34.0)
MCHC: 32.4 g/dL (ref 30.0–36.0)
MCV: 90 fL (ref 80.0–100.0)
Platelets: 249 10*3/uL (ref 150–400)
RBC: 3.4 MIL/uL — ABNORMAL LOW (ref 3.87–5.11)
RDW: 16.3 % — ABNORMAL HIGH (ref 11.5–15.5)
WBC: 9.7 10*3/uL (ref 4.0–10.5)
nRBC: 0 % (ref 0.0–0.2)

## 2023-12-29 LAB — MAGNESIUM: Magnesium: 1.9 mg/dL (ref 1.7–2.4)

## 2023-12-29 MED ORDER — GABAPENTIN 100 MG PO CAPS
100.0000 mg | ORAL_CAPSULE | Freq: Two times a day (BID) | ORAL | Status: AC
Start: 2023-12-29 — End: ?

## 2023-12-29 NOTE — Progress Notes (Signed)
Patient daughter states she spoke with provider about not wanting her mother to have lipitor. She was not happy that I had given her mom this medication, states that's probably why her mom did not sleep well, I ask her if she know the reason  the reason for medication, she responded for lipid. She demanded I write a note to the doctor not to give this med to her mother. I told her I will also inform the day shift nurse about this issue. She was not happy that we dont use wipes I told her its ok if she want to buy and bring for her mom she responded the hospital should provide this.

## 2023-12-29 NOTE — Plan of Care (Signed)
°  Problem: Education: Goal: Ability to describe self-care measures that may prevent or decrease complications (Diabetes Survival Skills Education) will improve Outcome: Progressing   Problem: Coping: Goal: Ability to adjust to condition or change in health will improve Outcome: Progressing   Problem: Fluid Volume: Goal: Ability to maintain a balanced intake and output will improve Outcome: Progressing   Problem: Health Behavior/Discharge Planning: Goal: Ability to identify and utilize available resources and services will improve Outcome: Progressing   Problem: Metabolic: Goal: Ability to maintain appropriate glucose levels will improve Outcome: Progressing   Problem: Nutritional: Goal: Maintenance of adequate nutrition will improve Outcome: Progressing   Problem: Skin Integrity: Goal: Risk for impaired skin integrity will decrease Outcome: Progressing   Problem: Tissue Perfusion: Goal: Adequacy of tissue perfusion will improve Outcome: Progressing   Problem: Activity: Goal: Risk for activity intolerance will decrease Outcome: Progressing   Problem: Nutrition: Goal: Adequate nutrition will be maintained Outcome: Progressing   Problem: Coping: Goal: Level of anxiety will decrease Outcome: Progressing   Problem: Skin Integrity: Goal: Risk for impaired skin integrity will decrease Outcome: Progressing   Problem: Education: Goal: Knowledge of disease or condition will improve Outcome: Progressing   Problem: Activity: Goal: Ability to tolerate increased activity will improve Outcome: Progressing   Problem: Respiratory: Goal: Ability to maintain a clear airway will improve Outcome: Progressing   Problem: Respiratory: Goal: Ability to maintain adequate ventilation will improve Outcome: Progressing

## 2023-12-29 NOTE — Progress Notes (Signed)
Physical Therapy Treatment Patient Details Name: Brittany Hernandez MRN: [id number] DOB: July 05, 1941 Today's Date: 12/29/2023   History of Present Illness 82 y/o female presented to ED on 12/24/53 for weakness and abdominal pain. Admitted for severe sepsis likely due to combination of CAP and UTI. PMH: CKD-3A, schizophrenia, depression, COPD, HTN, DM, CAD, hx of bladder cancer (s/p bladder removal w/ urostomy)    PT Comments  Pt resting in bed upon PT arrival; pt agreeable to therapy; pt's daughter present.  During session pt mod assist with bed mobility; mod assist to stand from bed up to RW; and CGA to ambulate 30 feet with RW use.  Limited distance ambulating d/t pt fatigue, weakness, and significant SOB. Pt's SpO2 sats 89% on room air with activity (92% at rest); HR was fluctuating between 60-127 bpm at rest beginning of session; HR increased to 140 bpm with activity; HR 130 bpm at rest end of session.  MD and pt's nurse updated regarding pt's vitals, symptoms, and mobility during session.  PT recommendations updated d/t above noted concerns and assist levels (MD, TOC, and nurse notified).    If plan is discharge home, recommend the following: A little help with walking and/or transfers;A little help with bathing/dressing/bathroom;Assistance with cooking/housework;Assist for transportation;Help with stairs or ramp for entrance   Can travel by private vehicle     No  Equipment Recommendations  Wheelchair (measurements PT);Wheelchair cushion (measurements PT);BSC/3in1 (pt has RW at home already)    Recommendations for Other Services       Precautions / Restrictions Precautions Precautions: Fall Precaution Comments: watch O2; RLQ urostomy Restrictions Weight Bearing Restrictions Per Provider Order: No     Mobility  Bed Mobility Overal bed mobility: Needs Assistance Bed Mobility: Supine to Sit, Sit to Supine     Supine to sit: Mod assist Sit to supine: Mod assist   General bed  mobility comments: assist for trunk; vc's for technique    Transfers Overall transfer level: Needs assistance Equipment used: Rolling walker (2 wheels) Transfers: Sit to/from Stand Sit to Stand: Mod assist           General transfer comment: assist to stand from bed; vc's for UE/LE placement; assist to initiate and come to full stand; assist to control descent sitting    Ambulation/Gait Ambulation/Gait assistance: Contact guard assist Gait Distance (Feet): 30 Feet Assistive device: Rolling walker (2 wheels) Gait Pattern/deviations: Step-through pattern, Decreased step length - right, Decreased step length - left, Trunk flexed Gait velocity: decreased     General Gait Details: limited d/t SOB   Stairs             Wheelchair Mobility     Tilt Bed    Modified Rankin (Stroke Patients Only)       Balance Overall balance assessment: Needs assistance Sitting-balance support: No upper extremity supported, Feet supported Sitting balance-Leahy Scale: Good Sitting balance - Comments: steady reaching within BOS   Standing balance support: Bilateral upper extremity supported, During functional activity Standing balance-Leahy Scale: Fair Standing balance comment: steady static standing with B UE support on RW                            Cognition Arousal: Alert Behavior During Therapy: WFL for tasks assessed/performed Overall Cognitive Status: Within Functional Limits for tasks assessed  Exercises      General Comments  Pt agreeable to PT session.      Pertinent Vitals/Pain Pain Assessment Pain Assessment: Faces Faces Pain Scale: No hurt Pain Intervention(s): Limited activity within patient's tolerance, Monitored during session    Home Living                          Prior Function            PT Goals (current goals can now be found in the care plan section) Acute Rehab  PT Goals Patient Stated Goal: to get stronger PT Goal Formulation: With patient/family Time For Goal Achievement: 01/11/24 Potential to Achieve Goals: Good Progress towards PT goals: Progressing toward goals    Frequency    Min 1X/week      PT Plan      Co-evaluation              AM-PAC PT "6 Clicks" Mobility   Outcome Measure  Help needed turning from your back to your side while in a flat bed without using bedrails?: A Little Help needed moving from lying on your back to sitting on the side of a flat bed without using bedrails?: A Lot Help needed moving to and from a bed to a chair (including a wheelchair)?: A Little Help needed standing up from a chair using your arms (e.g., wheelchair or bedside chair)?: A Lot Help needed to walk in hospital room?: A Little Help needed climbing 3-5 steps with a railing? : A Lot 6 Click Score: 15    End of Session Equipment Utilized During Treatment: Gait belt (up high away from urostomy site) Activity Tolerance: Patient limited by fatigue;Other (comment) (Limited d/t SOB) Patient left: in bed;with call bell/phone within reach;with bed alarm set;with family/visitor present Nurse Communication: Mobility status;Precautions;Other (comment) (pt's vitals/elevated HR concerns) PT Visit Diagnosis: Unsteadiness on feet (R26.81);Muscle weakness (generalized) (M62.81);Other abnormalities of gait and mobility (R26.89)     Time: 6010-9323 PT Time Calculation (min) (ACUTE ONLY): 36 min  Charges:    $Gait Training: 8-22 mins $Therapeutic Activity: 8-22 mins PT General Charges $$ ACUTE PT VISIT: 1 Visit                     Hendricks Limes, PT 12/29/23, 2:07 PM

## 2023-12-29 NOTE — Progress Notes (Signed)
PROGRESS NOTE    [name]  ZOX:[id number] DOB: [birth year] DOA: 12/25/2023 PCP: Evette Cristal, MD  117A/117A-AA  LOS: 4 days   Brief hospital course:   Assessment & Plan: Brittany Hernandez is a 82 y.o. female with medical history significant of bladder cancer (s/p bladder removal w/ ileal-conduit with urostomy), HTN, DM, COPD, CAD, schizophrenia, depression with psychosis, CKD-3A, UTI, PVC, tobacco abuse, who presents with weakness and cough with brownish colored sputum production.    Severe sepsis [facility]): Patient has severe sepsis with WBC 27.9, heart rate up to 136, RR 25.  Lactic acid 2.8 --> 2.3.  This is likely due to combination of CAP and UTI.  Patient was initially hypotensive with blood pressure 77/66, which improved return 138/81 after giving 3 L of LR bolus in ED.   CAP (community acquired pneumonia):  --CT a/p showed Left lower lobe airspace disease concerning for pneumonia.  Leukocytosis, procal 14.97. --patient received 1 dose of vancomycin, cefepime and Flagyl in ED  --completed 5 days of ceftriaxone and doxy    Presumed UTI (urinary tract infection)  ileal-conduit with urostomy --UA mod leuk and many bacteria.  Pt can not experience dysuria, so was treated as UTI. --urine cx pos for pan-sensitive E coli --completed 3 days of ceftriaxone   Trop elevation 2/2 demand ischemia NSTEMI, ruled out --trop peaked at 202  hx of CAD:  -cont ASA and statin   New onset atrial fibrillation w RVR 136 --> 70s now. CHADS2 score is 6.  -started IV heparin --daughter prefers not to have her mother on anticoagulation unless additional atrial fibrillation can be documented.  Daughter also refused bisoprolol ordered by cardio.   HTN (hypertension) --BP low initially --hold candesartan due to AKI --cont home lasix   COPD (chronic obstructive pulmonary disease) with emphysema (HCC) --DuoNeb --3% saline neb BID to clear mucus --IS and flutter valve   Type II diabetes  mellitus with renal manifestations (HCC): Recent A1c 6.8, well-controlled.  Patient is taking metformin --no need for BG checks and SSI   Acute renal failure superimposed on stage 3a chronic kidney disease (HCC): Likely multifactorial etiology, including UTI, continuation of Cozaar and Lasix, ATN is also possible due to hypotension.   --Cr 2.36 on presentation, improved to 1.06 after MIVF. --oral hydration   Severe recurrent depression with psychosis (HCC) and Schizoaffective disorder (HCC): pt is calm now --cont Topamax   Kidney lesion: CT showed multiple low-density lesions in the kidneys, most of which appear to reflect cysts. Indeterminate right upper pole and left midpole lesions noted which are not definitively cystic.  -need to f/u with PCP or urology for outpt work up.   DVT prophylaxis: Heparin SQ Code Status: Full code  Family Communication: daughter updated at bedside today Level of care: Med-Surg Dispo:   The patient is from: home Anticipated d/c is to: SNF rehab Anticipated d/c date is: whenever bed available   Subjective and Interval History:  Daughter reported pt had wheezing when up and exerting herself, however, no wheezing noted when at rest.  Pt wanted to go home, however, PT rec SNF rehab.   Objective: Vitals:   12/29/23 0500 12/29/23 0506 12/29/23 0731 12/29/23 1224  BP:  (!) 149/90 (!) 122/91   Pulse:  78 (!) 55   Resp:  20 19   Temp:  98 F (36.7 C) 98.2 F (36.8 C)   TempSrc:      SpO2:  95% 94% 93%  Weight: 65.1 kg  Height:        Intake/Output Summary (Last 24 hours) at 12/29/2023 2018 Last data filed at 12/29/2023 1500 Gross per 24 hour  Intake --  Output 600 ml  Net -600 ml   Filed Weights   12/25/23 2300 12/29/23 0500  Weight: 62 kg 65.1 kg    Examination:   Constitutional: NAD, alert, oriented to person and place HEENT: conjunctivae and lids normal, EOMI CV: No cyanosis.   RESP: normal respiratory effort at rest, on  RA Neuro: II - XII grossly intact.   Psych: Normal mood and affect.    urostomy present   Data Reviewed: I have personally reviewed labs and imaging studies  Time spent: 35 minutes  Darlin Priestly, MD [facility] If 7PM-7AM, please contact night-coverage 12/29/2023, 8:18 PM

## 2023-12-29 NOTE — TOC Transition Note (Addendum)
Transition of Care [facility]) - Discharge Note   Patient Details  Name: Brittany Hernandez MRN: [id number] Date of Birth: 1941-12-27  Transition of [facility]) CM/SW Contact:  Rodney Langton, RN Phone Number: 12/29/2023, 10:30 AM   Clinical Narrative:     Patient with discharge orders, daughter will provide transportation home.  Elnita Maxwell with [facility] notified, aware that orders are for PT and OT.  Update @ 1220: New PT recommendations for SNF instead of home with HH.  Spoke with daughter [name], she and family are trying to speak with patient about the need for rehab vs going home, patient is reluctant to agree.  [name] will continue to work with patient and notify [facility] team of decision later today.  Elnita Maxwell aware that discharge has been canceled.   Final next level of care: Home w Home Health Services Barriers to Discharge: Barriers Resolved   Patient Goals and CMS Choice Patient states their goals for this hospitalization and ongoing recovery are:: Home with [facility] services CMS Medicare.gov Compare Post Acute Care list provided to:: Patient Represenative (must comment) Choice offered to / list presented to : Adult Children      Discharge Placement                       Discharge Plan and Services Additional resources added to the After Visit Summary for       Post Acute Care Choice: Home Health                    HH Arranged: PT, OT [facility] Agency: [facility] Date [facility] Agency Contacted: 12/29/23 Time HH Agency Contacted: 1029 Representative spoke with at [facility] Agency: Elnita Maxwell  Social Drivers of Health (SDOH) Interventions SDOH Screenings   Food Insecurity: No Food Insecurity (12/28/2023)  Housing: Low Risk  (12/28/2023)  Transportation Needs: Unknown (12/28/2023)  Utilities: Not At Risk (12/28/2023)  Financial Resource Strain: Low Risk  (12/11/2023)   Received from [facility]  Physical Activity: Inactive (08/12/2023)   Received from [facility]   Social Connections: Moderately Isolated (08/12/2023)   Received from [facility]  Stress: Stress Concern Present (08/12/2023)   Received from [facility]  Tobacco Use: High Risk (12/25/2023)  Health Literacy: Low Risk  (04/06/2021)   Received from [facility]     Readmission Risk Interventions     No data to display

## 2023-12-30 DIAGNOSIS — A419 Sepsis, unspecified organism: Secondary | ICD-10-CM | POA: Diagnosis not present

## 2023-12-30 DIAGNOSIS — R6520 Severe sepsis without septic shock: Secondary | ICD-10-CM | POA: Diagnosis not present

## 2023-12-30 DIAGNOSIS — J9601 Acute respiratory failure with hypoxia: Secondary | ICD-10-CM

## 2023-12-30 LAB — CULTURE, BLOOD (ROUTINE X 2)
Culture: NO GROWTH
Culture: NO GROWTH

## 2023-12-30 LAB — GLUCOSE, CAPILLARY
Glucose-Capillary: 176 mg/dL — ABNORMAL HIGH (ref 70–99)
Glucose-Capillary: 156 mg/dL — ABNORMAL HIGH (ref 70–99)

## 2023-12-30 MED ORDER — AMLODIPINE BESYLATE 10 MG PO TABS
10.0000 mg | ORAL_TABLET | Freq: Every day | ORAL | Status: DC
Start: 2023-12-30 — End: 2023-12-31
  Administered 2023-12-30 – 2023-12-31 (×2): 10 mg via ORAL
  Filled 2023-12-30 (×2): qty 1

## 2023-12-30 MED ORDER — DILTIAZEM HCL ER COATED BEADS 180 MG PO CP24: 180.0000 mg | ORAL_CAPSULE | Freq: Every day | ORAL | Status: DC

## 2023-12-30 NOTE — Care Management Important Message (Signed)
Important Message  Patient Details  Name: Brittany Hernandez MRN: [id number] Date of Birth: 07-01-1941   Important Message Given:  Yes - Medicare IM     Cristela Blue, CMA 12/30/2023, 3:13 PM

## 2023-12-30 NOTE — Unmapped (Signed)
[facility] SUBJECT FOR NEW MSG: Patient Advice Request Patient Name: Tammy Braun  Caller:   Name of Caller: [phone]   Contact Method: Telephone Call: Time- Any Time [phone]   Reason for Call: Patients daughter is calling in to inform provider that patient is admitted to regional hospital. Patient should be being release tomorrow. She is wanting to know that once her mother is released could Dr. Gwenlyn Fudge bring mother in sooner to verify that all changes that have been made are approved by him and a hospital f/U.  Previously Discussed: no  Appointment Offered: No  If offered accepted, scheduled appt date: N/A

## 2023-12-30 NOTE — Progress Notes (Signed)
PROGRESS NOTE    Brittany Hernandez  TFT:[id number] DOB: [birth year] DOA: 12/25/2023 PCP: Evette Cristal, MD  117A/117A-AA  LOS: 5 days   Assessment & Plan: Brittany Hernandez is a 82 y.o. female with medical history significant of bladder cancer (s/p bladder removal w/ ileal-conduit with urostomy), HTN, DM, COPD, CAD, schizophrenia, depression with psychosis, CKD-3A, UTI, PVC, tobacco abuse, who presents with weakness and cough with brownish colored sputum production.    Severe sepsis [facility]): Patient has severe sepsis with WBC 27.9, heart rate up to 136, RR 25.  Lactic acid 2.8 --> 2.3.  This is likely due to combination of CAP and UTI.  Patient was initially hypotensive with blood pressure 77/66, which improved return 138/81 after giving 3 L of LR bolus in ED.   CAP (community acquired pneumonia):  --CT a/p showed Left lower lobe airspace disease concerning for pneumonia.  Leukocytosis, procal 14.97. --patient received 1 dose of vancomycin, cefepime and Flagyl in ED  --completed 5 days of ceftriaxone and doxy    Presumed UTI (urinary tract infection)  ileal-conduit with urostomy --UA mod leuk and many bacteria.  Pt can not experience dysuria, so was treated as UTI. --urine cx pos for pan-sensitive E coli --completed 3 days of ceftriaxone   Trop elevation 2/2 demand ischemia NSTEMI, ruled out --trop peaked at 202  hx of CAD:  -cont ASA and statin   New onset atrial fibrillation w RVR 136 --> 70s now. CHADS2 score is 6.  -started IV heparin --daughter prefers not to have her mother on anticoagulation  - outpt cardiology f/u   HTN (hypertension) --BP low initially --hold candesartan due to AKI --cont home lasix - add back home losartan at 10 mg per daughter's request   COPD (chronic obstructive pulmonary disease) with emphysema (HCC) --DuoNeb --3% saline neb BID to clear mucus --IS and flutter valve   Type II diabetes mellitus with renal manifestations (HCC): Recent A1c 6.8,  well-controlled.  Patient is taking metformin --no need for BG checks and SSI   Acute renal failure superimposed on stage 3a chronic kidney disease (HCC): Likely multifactorial etiology, including UTI, continuation of Cozaar and Lasix, ATN is also possible due to hypotension.   --Cr 2.36 on presentation, improved to 1.06 after MIVF. --oral hydration - hold arb at d/c   Severe recurrent depression with psychosis (HCC) and Schizoaffective disorder (HCC): pt is calm now --cont Topamax   Kidney lesion: CT showed multiple low-density lesions in the kidneys, most of which appear to reflect cysts. Indeterminate right upper pole and left midpole lesions noted which are not definitively cystic.  -need to f/u with PCP or urology for outpt work up.   DVT prophylaxis: Heparin SQ Code Status: Full code  Family Communication: daughter updated at bedside today Level of care: Med-Surg Dispo:   The patient is from: home Anticipated d/c is to: home, refuses skilled nursing Anticipated d/c date is: tomorrow   Subjective and Interval History:  No cough or sob, no pain, tolerating diet, refuses snf   Objective: Vitals:   12/29/23 2043 12/30/23 0646 12/30/23 0717 12/30/23 0825  BP: (!) 153/89 (!) 167/129  137/75  Pulse: 80 77  80  Resp: 18 17  19   Temp: 98.4 F (36.9 C) 98.7 F (37.1 C)  98.9 F (37.2 C)  TempSrc:    Oral  SpO2: 96% 93% 90% 98%  Weight:      Height:        Intake/Output Summary (Last 24  hours) at 12/30/2023 1611 Last data filed at 12/30/2023 1400 Gross per 24 hour  Intake --  Output 1350 ml  Net -1350 ml   Filed Weights   12/25/23 2300 12/29/23 0500  Weight: 62 kg 65.1 kg    Examination:   Constitutional: NAD, alert, oriented to person and place HEENT: conjunctivae and lids normal, EOMI CV: No cyanosis.   RESP: normal respiratory effort at rest, on RA, ctab Neuro: moving all 4 Psych: Normal mood and affect.    urostomy present   Data Reviewed: I have  personally reviewed labs and imaging studies  Silvano Bilis, MD [facility] If 7PM-7AM, please contact night-coverage 12/30/2023, 4:11 PM

## 2023-12-30 NOTE — Plan of Care (Signed)
Notified on call NP that pt and family refused the topamax.

## 2023-12-31 DIAGNOSIS — A419 Sepsis, unspecified organism: Secondary | ICD-10-CM | POA: Diagnosis not present

## 2023-12-31 DIAGNOSIS — J9601 Acute respiratory failure with hypoxia: Secondary | ICD-10-CM | POA: Diagnosis not present

## 2023-12-31 DIAGNOSIS — R6520 Severe sepsis without septic shock: Secondary | ICD-10-CM | POA: Diagnosis not present

## 2023-12-31 MED ORDER — ASPIRIN 81 MG PO TBEC: 325.0000 mg | DELAYED_RELEASE_TABLET | Freq: Every day | ORAL | 1 refills | Status: AC

## 2023-12-31 MED ORDER — TOPIRAMATE 25 MG PO TABS: 25.0000 mg | ORAL_TABLET | Freq: Every evening | ORAL | Status: AC

## 2023-12-31 MED ORDER — AMLODIPINE BESYLATE 10 MG PO TABS: 10.0000 mg | ORAL_TABLET | Freq: Every day | ORAL | 1 refills | Status: AC

## 2023-12-31 NOTE — Discharge Summary (Addendum)
 Brittany Hernandez FMW:[id number] DOB: 1941/06/23 DOA: 12/25/2023  PCP: Elige Juliene KIDD, MD  Admit date: 12/25/2023 Discharge date: 12/31/2023  Time spent: 35 minutes  Recommendations for Outpatient Follow-up:  Pcp f/u Cardiology f/u (new a-fib) Consider outpatient renal u/s (see below)     Discharge Diagnoses:  Principal Problem:   Sepsis with acute organ dysfunction (HCC) Active Problems:   CAP (community acquired pneumonia)   UTI (urinary tract infection)   NSTEMI (non-ST elevated myocardial infarction) (HCC)   CAD (coronary artery disease)   New onset atrial fibrillation (HCC)   HTN (hypertension)   COPD (chronic obstructive pulmonary disease) with emphysema (HCC)   Type II diabetes mellitus with renal manifestations (HCC)   Acute renal failure superimposed on stage 3a chronic kidney disease (HCC)   Severe recurrent depression with psychosis (HCC)   Schizoaffective disorder (HCC)   Kidney lesion   Pulmonary hypertension, unspecified (HCC)   Discharge Condition: stable  Diet recommendation: heart healthy  Filed Weights   12/25/23 2300 12/29/23 0500 12/31/23 0358  Weight: 62 kg 65.1 kg 68 kg    History of present illness:  From admission h and p  Brittany Hernandez is a 82 y.o. female with medical history significant of bladder cancer (s/p bladder removal w/ ileal-conduit with urostomy), HTN, DM, COPD, CAD, schizophrenia, depression with psychosis, CKD-3A, UTI, PVC, tobacco abuse, who presents with weakness and cough.   Patient states that she has feeling weak in the past several days, which has been progressively worsening.  No unilateral numbness or tingling in extremities.  She feels whoozy.  She has cough with brownish colored sputum production.  Has mild SOB, no chest pain.  No fever or chills.  She also reports lower abdominal pain, which is constant, mild, nonradiating.  No nausea vomiting.  She has urostomy, therefore no symptoms of UTI.  No recent fall or head  injury.  No dark stool or rectal bleeding.   Hospital Course:   ANNASOPHIA Hernandez is a 82 y.o. female with medical history significant of bladder cancer (s/p bladder removal w/ ileal-conduit with urostomy), HTN, DM, COPD, CAD, schizophrenia, depression with psychosis, CKD-3A, UTI, PVC, tobacco abuse, who presents with weakness and cough with brownish colored sputum production.    Severe sepsis [facility]): Patient has severe sepsis with WBC 27.9, heart rate up to 136, RR 25.  Lactic acid 2.8 --> 2.3.  This is likely due to combination of CAP and UTI.  Patient was initially hypotensive with blood pressure 77/66, which improved return 138/81 after giving 3 L of LR bolus in ED.  Sepsis resolved   CAP (community acquired pneumonia):  --CT a/p showed Left lower lobe airspace disease concerning for pneumonia.  Leukocytosis, procal 14.97. --patient received 1 dose of vancomycin , cefepime  and Flagyl  in ED  --completed 5 days of ceftriaxone  and doxy  Symptoms resolved   Presumed UTI (urinary tract infection)  ileal-conduit with urostomy --UA mod leuk and many bacteria.  Pt can not experience dysuria, so was treated as UTI. --urine cx pos for pan-sensitive E coli --completed 3 days of ceftriaxone    Trop elevation 2/2 demand ischemia NSTEMI, ruled out --trop peaked at 202   hx of CAD:  -cont ASA and statin   New onset atrial fibrillation w RVR 136 --> 70s now. CHADS2 score is 6.  -started IV heparin  --daughter declines anticoagulation - outpt cardiology f/u   HTN (hypertension) --BP low initially --hold candesartan due to AKI --cont home lasix  - added back previous  amlodipine    COPD (chronic obstructive pulmonary disease) with emphysema (HCC) --DuoNeb --3% saline neb BID to clear mucus --IS and flutter valve   Type II diabetes mellitus with renal manifestations (HCC): Recent A1c 6.8, well-controlled.      Acute renal failure superimposed on stage 3a chronic kidney disease (HCC): Likely  multifactorial etiology, including UTI, continuation of Cozaar and Lasix , ATN is also possible due to hypotension. Kidney function has normalized   Severe recurrent depression with psychosis (HCC) and Schizoaffective disorder (HCC):  --cont Topamax    Kidney lesion: CT showed multiple low-density lesions in the kidneys, most of which appear to reflect cysts. Indeterminate right upper pole and left midpole lesions noted which are not definitively cystic.  - consider outpt renal u/s to further characterize  Procedures: none   Consultations: none  Discharge Exam: Vitals:   12/31/23 0358 12/31/23 0811  BP: (!) 136/91 (!) 146/71  Pulse: 78 76  Resp: 18 16  Temp: 98.7 F (37.1 C) 98.4 F (36.9 C)  SpO2: 95% 92%    Constitutional: NAD, alert, oriented to person and place HEENT: conjunctivae and lids normal, EOMI CV: No cyanosis.   RESP: normal respiratory effort at rest, on RA, ctab Neuro: moving all 4 Psych: Normal mood and affect.    Discharge Instructions   Discharge Instructions     Diet - low sodium heart healthy   Complete by: As directed    Diet - low sodium heart healthy   Complete by: As directed    Increase activity slowly   Complete by: As directed       Allergies as of 12/31/2023       Reactions   Losartan    Other Reaction(s): Dizziness   Hydrochlorothiazide    SIADH   Lisinopril Swelling        Medication List     PAUSE taking these medications    candesartan 16 MG tablet Wait to take this until your doctor or other care provider tells you to start again. Hold until follow up with your PCP, due to acute kidney injury and normal blood pressure. Commonly known as: ATACAND Take 16 mg by mouth 2 (two) times daily.       TAKE these medications    acetaminophen  500 MG tablet Commonly known as: TYLENOL  Take 1,000 mg by mouth every 8 (eight) hours as needed for pain.   albuterol  108 (90 Base) MCG/ACT inhaler Commonly known as: VENTOLIN   HFA Inhale 2 puffs into the lungs every 4 (four) hours as needed for wheezing.   amLODipine  10 MG tablet Commonly known as: NORVASC  Take 1 tablet (10 mg total) by mouth daily. Start taking on: January 01, 2024   aspirin  EC 81 MG tablet Take 4 tablets (325 mg total) by mouth daily. Start taking on: January 01, 2024   fluPHENAZine decanoate 25 MG/ML injection Commonly known as: PROLIXIN   furosemide  20 MG tablet Commonly known as: LASIX  Take 20 mg by mouth daily.   gabapentin  100 MG capsule Commonly known as: Neurontin  Take 1 capsule (100 mg total) by mouth 2 (two) times daily. Home med. What changed:  how much to take how to take this when to take this additional instructions   topiramate  25 MG tablet Commonly known as: TOPAMAX  Take 1 tablet (25 mg total) by mouth Nightly.       Allergies  Allergen Reactions   Losartan     Other Reaction(s): Dizziness   Hydrochlorothiazide     SIADH  Lisinopril Swelling    Follow-up Information     Elige Juliene KIDD, MD Follow up in 1 week(s).   Specialty: Family Medicine Why: Hospital follow up Contact information: [address] [facility] [id number] [facility] [place] [address] [phone]         Perla Evalene PARAS, MD Follow up.   Specialty: Cardiology Contact information: [address] [place] [address] [phone]                  The results of significant diagnostics from this hospitalization (including imaging, microbiology, ancillary and laboratory) are listed below for reference.    Significant Diagnostic Studies: ECHOCARDIOGRAM COMPLETE Result Date: 12/27/2023    ECHOCARDIOGRAM REPORT   Patient Name:   KYNZIE POLGAR Date of Exam: 12/27/2023 Medical Rec #:  [id number]      Height:       61.0 in Accession #:    [id number]     Weight:       136.7 lb Date of Birth:  1941/01/14      BSA:          1.607 m Patient Age:    82 years       BP:           110/73  mmHg Patient Gender: F              HR:           66 bpm. Exam Location:  [facility] Procedure: 2D Echo, Cardiac Doppler and Color Doppler Indications:     Atrial Fibrillation I48.91  History:         Patient has no prior history of Echocardiogram examinations.                  Risk Factors:Hypertension and Diabetes.  Sonographer:     Christopher Furnace Referring Phys:  [id number] EVALENE PARAS PERLA Diagnosing Phys: Evalene Perla MD IMPRESSIONS  1. Left ventricular ejection fraction, by estimation, is 55 to 60%. The left ventricle has normal function. The left ventricle has no regional wall motion abnormalities. There is moderate left ventricular hypertrophy. Left ventricular diastolic parameters are consistent with Grade I diastolic dysfunction (impaired relaxation). There is the interventricular septum is flattened in systole and diastole, consistent with right ventricular pressure and volume overload.  2. Right ventricular systolic function is normal. The right ventricular size is normal. There is severely elevated pulmonary artery systolic pressure. The estimated right ventricular systolic pressure is 73.6 mmHg.  3. Right atrial size was moderately dilated.  4. The mitral valve is normal in structure. No evidence of mitral valve regurgitation. No evidence of mitral stenosis.  5. Tricuspid valve regurgitation is moderate to severe.  6. The aortic valve is normal in structure. Aortic valve regurgitation is mild. Aortic valve sclerosis/calcification is present, without any evidence of aortic stenosis.  7. The inferior vena cava is normal in size with greater than 50% respiratory variability, suggesting right atrial pressure of 3 mmHg. FINDINGS  Left Ventricle: Left ventricular ejection fraction, by estimation, is 55 to 60%. The left ventricle has normal function. The left ventricle has no regional wall motion abnormalities. The left ventricular internal cavity size was normal in size. There is  moderate left ventricular hypertrophy.  The interventricular septum is flattened in systole and diastole, consistent with right ventricular pressure and volume overload. Left ventricular diastolic parameters are consistent with Grade I diastolic dysfunction (impaired relaxation). Right Ventricle: The right ventricular size  is normal. No increase in right ventricular wall thickness. Right ventricular systolic function is normal. There is severely elevated pulmonary artery systolic pressure. The tricuspid regurgitant velocity is 4.14 m/s, and with an assumed right atrial pressure of 5 mmHg, the estimated right ventricular systolic pressure is 73.6 mmHg. Left Atrium: Left atrial size was normal in size. Right Atrium: Right atrial size was moderately dilated. Pericardium: There is no evidence of pericardial effusion. Mitral Valve: The mitral valve is normal in structure. No evidence of mitral valve regurgitation. No evidence of mitral valve stenosis. MV peak gradient, 7.0 mmHg. The mean mitral valve gradient is 3.0 mmHg. Tricuspid Valve: The tricuspid valve is normal in structure. Tricuspid valve regurgitation is moderate to severe. No evidence of tricuspid stenosis. Aortic Valve: The aortic valve is normal in structure. Aortic valve regurgitation is mild. Aortic valve sclerosis/calcification is present, without any evidence of aortic stenosis. Aortic valve mean gradient measures 3.0 mmHg. Aortic valve peak gradient measures 5.8 mmHg. Aortic valve area, by VTI measures 2.55 cm. Pulmonic Valve: The pulmonic valve was normal in structure. Pulmonic valve regurgitation is mild. No evidence of pulmonic stenosis. Aorta: The aortic root is normal in size and structure. Venous: The inferior vena cava is normal in size with greater than 50% respiratory variability, suggesting right atrial pressure of 3 mmHg. IAS/Shunts: No atrial level shunt detected by color flow Doppler.  LEFT VENTRICLE PLAX 2D LVIDd:         2.90 cm   Diastology LVIDs:         1.50 cm   LV e'  medial:    6.96 cm/s LV PW:         1.00 cm   LV E/e' medial:  14.5 LV IVS:        1.80 cm   LV e' lateral:   13.20 cm/s LVOT diam:     2.00 cm   LV E/e' lateral: 7.7 LV SV:         58 LV SV Index:   36 LVOT Area:     3.14 cm  RIGHT VENTRICLE RV Basal diam:  4.30 cm RV Mid diam:    3.30 cm RV S prime:     15.00 cm/s LEFT ATRIUM             Index        RIGHT ATRIUM           Index LA diam:        3.30 cm 2.05 cm/m   RA Area:     24.70 cm LA Vol (A2C):   69.4 ml 43.20 ml/m  RA Volume:   87.90 ml  54.71 ml/m LA Vol (A4C):   59.2 ml 36.85 ml/m LA Biplane Vol: 66.0 ml 41.08 ml/m  AORTIC VALVE                    PULMONIC VALVE AV Area (Vmax):    2.45 cm     PR End Diast Vel: 5.95 msec AV Area (Vmean):   2.55 cm AV Area (VTI):     2.55 cm AV Vmax:           120.00 cm/s AV Vmean:          75.800 cm/s AV VTI:            0.228 m AV Peak Grad:      5.8 mmHg AV Mean Grad:      3.0 mmHg LVOT Vmax:  93.40 cm/s LVOT Vmean:        61.500 cm/s LVOT VTI:          0.185 m LVOT/AV VTI ratio: 0.81  AORTA Ao Root diam: 2.80 cm MITRAL VALVE                TRICUSPID VALVE MV Area (PHT): 3.08 cm     TR Peak grad:   68.6 mmHg MV Area VTI:   1.39 cm     TR Vmax:        414.00 cm/s MV Peak grad:  7.0 mmHg MV Mean grad:  3.0 mmHg     SHUNTS MV Vmax:       1.32 m/s     Systemic VTI:  0.18 m MV Vmean:      73.8 cm/s    Systemic Diam: 2.00 cm MV Decel Time: 246 msec MV E velocity: 101.00 cm/s MV A velocity: 108.00 cm/s MV E/A ratio:  0.94 Evalene Lunger MD Electronically signed by Evalene Lunger MD Signature Date/Time: 12/27/2023/12:14:47 PM    Final    CT ABDOMEN PELVIS WO CONTRAST Result Date: 12/25/2023 CLINICAL DATA:  Abdominal pain, acute, nonlocalized EXAM: CT ABDOMEN AND PELVIS WITHOUT CONTRAST TECHNIQUE: Multidetector CT imaging of the abdomen and pelvis was performed following the standard protocol without IV contrast. RADIATION DOSE REDUCTION: This exam was performed according to the departmental dose-optimization  program which includes automated exposure control, adjustment of the mA and/or kV according to patient size and/or use of iterative reconstruction technique. COMPARISON:  None Available. FINDINGS: Lower chest: There is a large hiatal hernia containing stomach as well as transverse colon and small bowel loops. Much of the hernia is in the right lower chest. Airspace disease in the left lower lobe concerning for pneumonia. No effusions. Coronary artery and aortic atherosclerosis. Hepatobiliary: No focal liver abnormality is seen. Status post cholecystectomy. No biliary dilatation. Pancreas: No focal abnormality or ductal dilatation. Spleen: No focal abnormality.  Normal size. Adrenals/Urinary Tract: Adrenal glands normal. Bilateral renal low-density lesions, many of which appear to reflect cysts. Indeterminate lesion in the upper pole of the right kidney measures up to 2.9 cm. 1.7 cm indeterminate lesion in the midpole of the left kidney. No hydronephrosis. Urinary bladder not visualized with surgical clips in the pelvis, likely prior cystectomy. Right lower quadrant urostomy noted. Stomach/Bowel: No bowel obstruction or inflammatory process. Sigmoid diverticulosis. Vascular/Lymphatic: Aortoiliac atherosclerosis. No evidence of aneurysm or adenopathy. Reproductive: Prior hysterectomy.  No adnexal masses. Other: No free fluid or free air. Musculoskeletal: No acute bony abnormality. IMPRESSION: Large hiatal hernia containing stomach, transverse colon and small bowel loops. Left lower lobe airspace disease concerning for pneumonia. Aortic atherosclerosis, coronary artery disease. Multiple low-density lesions in the kidneys, most of which appear to reflect cysts. Indeterminate right upper pole and left midpole lesions noted which are not definitively cystic. These could be further evaluated with elective renal ultrasound. Electronically Signed   By: Franky Crease M.D.   On: 12/25/2023 19:38   DG Chest Port 1 View Result  Date: 12/25/2023 CLINICAL DATA:  Question of sepsis to evaluate for abnormality. Shortness of breath, dark urine, low oxygen saturation. History of COPD. EXAM: PORTABLE CHEST 1 VIEW COMPARISON:  06/16/2020 FINDINGS: Shallow inspiration with elevation of right hemidiaphragm versus right diaphragmatic hernia. 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 This is unchanged since prior study. Suggestion of small developing pleural effusions with basilar atelectasis or infiltration. Possible pneumonia versus compressive atelectasis. No pneumothorax. Heart size  and pulmonary vascularity are normal for technique. Calcification of the aorta. Degenerative changes in the right shoulder. Postoperative change in the left shoulder. IMPRESSION: 1. 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 2. Developing small pleural effusions with basilar atelectasis or infiltration. Electronically Signed   By: Elsie Gravely M.D.   On: 12/25/2023 17:13    Microbiology: Recent Results (from the past 240 hours)  Blood Culture (routine x 2)     Status: None   Collection Time: 12/25/23  4:21 PM   Specimen: BLOOD  Result Value Ref Range Status   Specimen Description BLOOD BLOOD LEFT WRIST  Final   Special Requests   Final    BOTTLES DRAWN AEROBIC AND ANAEROBIC Blood Culture results may not be optimal due to an inadequate volume of blood received in culture bottles   Culture   Final    NO GROWTH 5 DAYS Performed at [facility], [address] [place], [place] [address]    Report Status 12/30/2023 FINAL  Final  Resp panel by RT-PCR (RSV, Flu A&B, Covid) Anterior Nasal Swab     Status: None   Collection Time: 12/25/23  4:25 PM   Specimen: Anterior Nasal Swab  Result Value Ref Range Status   SARS Coronavirus 2 by RT PCR NEGATIVE NEGATIVE Final    Comment: (NOTE) SARS-CoV-2 target nucleic acids are NOT  DETECTED.  The SARS-CoV-2 RNA is generally detectable in upper respiratory specimens during the acute phase of infection. The lowest concentration of SARS-CoV-2 viral copies this assay can detect is 138 copies/mL. A negative result does not preclude SARS-Cov-2 infection and should not be used as the sole basis for treatment or other patient management decisions. A negative result may occur with  improper specimen collection/handling, submission of specimen other than nasopharyngeal swab, presence of viral mutation(s) within the areas targeted by this assay, and inadequate number of viral copies(<138 copies/mL). A negative result must be combined with clinical observations, patient history, and epidemiological information. The expected result is Negative.  Fact Sheet for Patients:  bloggercourse.com  Fact Sheet for Healthcare Providers:  seriousbroker.it  This test is no t yet approved or cleared by the [place]  FDA and  has been authorized for detection and/or diagnosis of SARS-CoV-2 by FDA under an Emergency Use Authorization (EUA). This EUA will remain  in effect (meaning this test can be used) for the duration of the COVID-19 declaration under Section 564(b)(1) of the Act, 21 U.S.C.section 360bbb-3(b)(1), unless the authorization is terminated  or revoked sooner.       Influenza A by PCR NEGATIVE NEGATIVE Final   Influenza B by PCR NEGATIVE NEGATIVE Final    Comment: (NOTE) The Xpert Xpress SARS-CoV-2/FLU/RSV plus assay is intended as an aid in the diagnosis of influenza from Nasopharyngeal swab specimens and should not be used as a sole basis for treatment. Nasal washings and aspirates are unacceptable for Xpert Xpress SARS-CoV-2/FLU/RSV testing.  Fact Sheet for Patients: bloggercourse.com  Fact Sheet for Healthcare Providers: seriousbroker.it  This test is not yet  approved or cleared by the [place]  FDA and has been authorized for detection and/or diagnosis of SARS-CoV-2 by FDA under an Emergency Use Authorization (EUA). This EUA will remain in effect (meaning this test can be used) for the duration of the COVID-19 declaration under Section 564(b)(1) of the Act, 21 U.S.C. section 360bbb-3(b)(1), unless the authorization is terminated or revoked.     Resp Syncytial Virus by PCR  NEGATIVE NEGATIVE Final    Comment: (NOTE) Fact Sheet for Patients: bloggercourse.com  Fact Sheet for Healthcare Providers: seriousbroker.it  This test is not yet approved or cleared by the [place]  FDA and has been authorized for detection and/or diagnosis of SARS-CoV-2 by FDA under an Emergency Use Authorization (EUA). This EUA will remain in effect (meaning this test can be used) for the duration of the COVID-19 declaration under Section 564(b)(1) of the Act, 21 U.S.C. section 360bbb-3(b)(1), unless the authorization is terminated or revoked.  Performed at [facility], [address] [place], [place] [address]   Urine Culture     Status: Abnormal   Collection Time: 12/25/23  4:25 PM   Specimen: Urine, Random  Result Value Ref Range Status   Specimen Description   Final    URINE, RANDOM Performed at [facility], [address] [place], [place] [address]    Special Requests   Final    NONE Reflexed from [id number] Performed at [facility], [address] [place], [place] [address]    Culture >=100,000 COLONIES/mL ESCHERICHIA COLI (A)  Final   Report Status 12/28/2023 FINAL  Final   Organism ID, Bacteria ESCHERICHIA COLI (A)  Final      Susceptibility   Escherichia coli - MIC*    AMPICILLIN 8 SENSITIVE Sensitive     CEFAZOLIN <=4 SENSITIVE Sensitive     CEFEPIME  <=0.12 SENSITIVE Sensitive     CEFTRIAXONE  <=0.25 SENSITIVE Sensitive     CIPROFLOXACIN <=0.25 SENSITIVE  Sensitive     GENTAMICIN <=1 SENSITIVE Sensitive     IMIPENEM <=0.25 SENSITIVE Sensitive     NITROFURANTOIN <=16 SENSITIVE Sensitive     TRIMETH/SULFA <=20 SENSITIVE Sensitive     AMPICILLIN/SULBACTAM <=2 SENSITIVE Sensitive     PIP/TAZO <=4 SENSITIVE Sensitive ug/mL    * >=100,000 COLONIES/mL ESCHERICHIA COLI  Blood Culture (routine x 2)     Status: None   Collection Time: 12/25/23  4:55 PM   Specimen: BLOOD  Result Value Ref Range Status   Specimen Description BLOOD BLOOD LEFT ARM  Final   Special Requests   Final    BOTTLES DRAWN AEROBIC AND ANAEROBIC Blood Culture results may not be optimal due to an inadequate volume of blood received in culture bottles   Culture   Final    NO GROWTH 5 DAYS Performed at [facility], [address], [place], [place] [address]    Report Status 12/30/2023 FINAL  Final  Expectorated Sputum Assessment w Gram Stain, Rflx to Resp Cult     Status: None   Collection Time: 12/25/23 11:11 PM   Specimen: Sputum  Result Value Ref Range Status   Specimen Description SPUTUM  Final   Special Requests NONE  Final   Sputum evaluation   Final    THIS SPECIMEN IS ACCEPTABLE FOR SPUTUM CULTURE Performed at [facility], [address] [place], [place] [address]    Report Status 12/26/2023 FINAL  Final  Culture, Respiratory w Gram Stain     Status: None   Collection Time: 12/25/23 11:11 PM   Specimen: SPU  Result Value Ref Range Status   Specimen Description   Final    SPUTUM Performed at [facility], [address], [place], [place] [address]    Special Requests   Final    NONE Reflexed from [id number] Performed at [facility], [address] [place], [place] [address]    Gram Stain   Final  RARE WBC PRESENT, PREDOMINANTLY PMN FEW GRAM POSITIVE COCCI    Culture   Final    Normal respiratory flora-no Staph aureus or Pseudomonas seen Performed at [facility], [address] [place], [place] [address]     Report Status 12/29/2023 FINAL  Final     Labs: Basic Metabolic Panel: Recent Labs  Lab 12/25/23 1625 12/25/23 2057 12/26/23 0400 12/27/23 0211 12/28/23 0437 12/29/23 0546  NA 133*  --  134* 131* 135 139  K 5.9* 4.5 4.0 4.0 4.5 4.4  CL 100  --  103 104 107 108  CO2 17*  --  21* 18* 20* 22  GLUCOSE 201*  --  145* 148* 158* 142*  BUN 56*  --  51* 37* 27* 21  CREATININE 2.36*  --  1.84* 1.29* 1.06* 1.03*  CALCIUM  9.7  --  8.7* 8.4* 8.8* 9.5  MG  --   --  1.8 1.8 2.2 1.9   Liver Function Tests: Recent Labs  Lab 12/25/23 1625  AST 29  ALT 10  ALKPHOS 69  BILITOT 1.3*  PROT 7.3  ALBUMIN 3.3*   Recent Labs  Lab 12/25/23 1625  LIPASE 18   No results for input(s): AMMONIA in the last 168 hours. CBC: Recent Labs  Lab 12/25/23 1625 12/26/23 0515 12/27/23 0211 12/28/23 0437 12/29/23 0546  WBC 27.9* 22.9* 15.1* 10.1 9.7  NEUTROABS 24.7*  --   --   --   --   HGB 12.0 9.9* 9.6* 10.2* 9.9*  HCT 37.3 31.1* 29.6* 31.6* 30.6*  MCV 89.9 90.9 91.1 88.0 90.0  PLT 216 213 197 254 249   Cardiac Enzymes: No results for input(s): CKTOTAL, CKMB, CKMBINDEX, TROPONINI in the last 168 hours. BNP: BNP (last 3 results) No results for input(s): BNP in the last 8760 hours.  ProBNP (last 3 results) No results for input(s): PROBNP in the last 8760 hours.  CBG: Recent Labs  Lab 12/27/23 2117 12/28/23 0546 12/28/23 0755 12/30/23 0450 12/30/23 0649  GLUCAP 116* 129* 149* 176* 156*       Signed:  Devaughn KATHEE Ban MD.  [facility] 12/31/2023, 10:57 AM

## 2023-12-31 NOTE — Unmapped (Signed)
[facility] Clinic Administered Medication Refill Coordination Note      NAME:Tammy Braun DOB: April 18, 1941      Medication: fluphenazine decanoate    Day Supply:  30 days      SHIPPING      Next delivery from [facility] [phone]) to Adult [facility]iatry Clinic for Tammy Braun is scheduled for 01/03.    Clinic contact: [name]     Patient's next nurse visit for administration: 01/10.    We will follow up with clinic monthly for standard refill processing and delivery.      Melodi Happel Samella Parr  [facility] and [facility]

## 2023-12-31 NOTE — Plan of Care (Signed)
  Problem: Education: Goal: Ability to describe self-care measures that may prevent or decrease complications (Diabetes Survival Skills Education) will improve Outcome: Progressing Goal: Individualized Educational Video(s) Outcome: Progressing   Problem: Coping: Goal: Ability to adjust to condition or change in health will improve Outcome: Progressing   Problem: Fluid Volume: Goal: Ability to maintain a balanced intake and output will improve Outcome: Progressing   Problem: Health Behavior/Discharge Planning: Goal: Ability to identify and utilize available resources and services will improve Outcome: Progressing Goal: Ability to manage health-related needs will improve Outcome: Progressing   Problem: Metabolic: Goal: Ability to maintain appropriate glucose levels will improve Outcome: Progressing   Problem: Nutritional: Goal: Maintenance of adequate nutrition will improve Outcome: Progressing Goal: Progress toward achieving an optimal weight will improve Outcome: Progressing   Problem: Skin Integrity: Goal: Risk for impaired skin integrity will decrease Outcome: Progressing   Problem: Tissue Perfusion: Goal: Adequacy of tissue perfusion will improve Outcome: Progressing   Problem: Education: Goal: Knowledge of General Education information will improve Description: Including pain rating scale, medication(s)/side effects and non-pharmacologic comfort measures Outcome: Progressing   Problem: Health Behavior/Discharge Planning: Goal: Ability to manage health-related needs will improve Outcome: Progressing   Problem: Health Behavior/Discharge Planning: Goal: Ability to manage health-related needs will improve Outcome: Progressing   Problem: Clinical Measurements: Goal: Ability to maintain clinical measurements within normal limits will improve Outcome: Progressing Goal: Will remain free from infection Outcome: Progressing Goal: Diagnostic test results will  improve Outcome: Progressing Goal: Respiratory complications will improve Outcome: Progressing Goal: Cardiovascular complication will be avoided Outcome: Progressing   Problem: Activity: Goal: Risk for activity intolerance will decrease Outcome: Progressing   Problem: Nutrition: Goal: Adequate nutrition will be maintained Outcome: Progressing   Problem: Coping: Goal: Level of anxiety will decrease Outcome: Progressing   Problem: Elimination: Goal: Will not experience complications related to bowel motility Outcome: Progressing Goal: Will not experience complications related to urinary retention Outcome: Progressing   Problem: Pain Management: Goal: General experience of comfort will improve Outcome: Progressing   Problem: Safety: Goal: Ability to remain free from injury will improve Outcome: Progressing   Problem: Skin Integrity: Goal: Risk for impaired skin integrity will decrease Outcome: Progressing   Problem: Education: Goal: Knowledge of disease or condition will improve Outcome: Progressing Goal: Knowledge of the prescribed therapeutic regimen will improve Outcome: Progressing Goal: Individualized Educational Video(s) Outcome: Progressing   Problem: Activity: Goal: Ability to tolerate increased activity will improve Outcome: Progressing Goal: Will verbalize the importance of balancing activity with adequate rest periods Outcome: Progressing   Problem: Respiratory: Goal: Ability to maintain a clear airway will improve Outcome: Progressing Goal: Levels of oxygenation will improve Outcome: Progressing Goal: Ability to maintain adequate ventilation will improve Outcome: Progressing   Problem: Activity: Goal: Ability to tolerate increased activity will improve Outcome: Progressing

## 2023-12-31 NOTE — TOC Transition Note (Signed)
 Transition of Care [facility]) - Discharge Note   Patient Details  Name: Brittany Hernandez MRN: [id number] Date of Birth: 02/22/1941  [facility]) CM/SW Contact:  Ladene Lady, LCSW Phone Number: 12/31/2023, 10:59 AM   Clinical Narrative:   Pt has orders to discharge home with [facility]. pT Declining SNF. [facility] notified.     Final next level of care: Home w Home Health Services Barriers to Discharge: Barriers Resolved   Patient Goals and CMS Choice Patient states their goals for this hospitalization and ongoing recovery are:: Home with [facility] services CMS Medicare.gov Compare Post Acute Care list provided to:: Patient Represenative (must comment) Choice offered to / list presented to : Adult Children      Discharge Placement                    Patient and family notified of of transfer: 12/31/23  Discharge Plan and Services Additional resources added to the After Visit Summary for       Post Acute Care Choice: Home Health                    HH Arranged: PT, OT [facility] Agency: [facility] Date [facility] Agency Contacted: 12/31/23 Time HH Agency Contacted: 1029 Representative spoke with at [facility] Agency: [place]  Social Drivers of Health (SDOH) Interventions SDOH Screenings   Food Insecurity: No Food Insecurity (12/28/2023)  Housing: Low Risk  (12/28/2023)  Transportation Needs: Unknown (12/28/2023)  Utilities: Not At Risk (12/28/2023)  Financial Resource Strain: Low Risk  (12/11/2023)   Received from [facility]  Physical Activity: Inactive (08/12/2023)   Received from [facility]  Social Connections: Moderately Isolated (08/12/2023)   Received from [facility]  Stress: Stress Concern Present (08/12/2023)   Received from [facility]  Tobacco Use: High Risk (12/25/2023)  Health Literacy: Low Risk  (04/06/2021)   Received from [facility]     Readmission Risk Interventions     No data to display

## 2024-01-02 MED FILL — FLUPHENAZINE DECANOATE 25 MG/ML INJECTION SOLUTION: INTRAMUSCULAR | 30 days supply | Qty: 5 | Fill #1

## 2024-01-02 NOTE — Unmapped (Unsigned)
Reason for Visit: Hospital Follow-up Medication Management    History of Present Illness:  Tammy Braun is a 83 y.o. female with a past medical history of bladder cancer (s/p bladder removal w/ ileal-conduit with urostomy), HTN, DM, COPD, CAD, schizophrenia, depression, CKD3a, UTI, PVC, tobacco use who was recently hospitalized from 12/25/23 to 12/31/23 at [facility] for weakness and cough, ultimately diagnosed with severe sepsis, CAP (s/p doxy + ceftriaxone x 5 days), and presumed UTI. Of note, also was found to have new onset atrial fibrillation. Daughter declined anticoagulation. Pt presents to the [facility] Trans[facility] Care [facility] for follow-up {Blank single:19197::with,without} all of her medication bottles.     Since discharge, ***  - completed 5 days of ceftriaxone and doxy  - A1c 6.8%  - acute renal failure though kidney function normalized    [ ]  ziopatch?  [ ]  statin - previously on atorva?  [ ]  switch candesartan to longer acting ARB?  Cards 11/13/23 - start taking aspirin 81mg  and atorva 40, cont candesartan, start amlodipine; said not to start glipizide b/c a1c was 6%  [ ]  needs med rec    PAUSE - candesartan - hold until f/u with PCP due to AKI and normal BP  Take:     acetaminophen 500 MG tablet  Commonly known as: TYLENOL  Take 1,000 mg by mouth every 8 (eight) hours as needed for pain.    albuterol 108 (90 Base) MCG/ACT inhaler  Commonly known as: VENTOLIN HFA  Inhale 2 puffs into the lungs every 4 (four) hours as needed for wheezing.    amLODipine 10 MG tablet  Commonly known as: NORVASC  Take 1 tablet (10 mg total) by mouth daily.  Start taking on: January 01, 2024    aspirin EC 81 MG tablet  Take 4 tablets (325 mg total) by mouth daily.   Start taking on: January 01, 2024    fluPHENAZine decanoate 25 MG/ML injection  Commonly known as: PROLIXIN    furosemide 20 MG tablet  Commonly known as: LASIX  Take 20 mg by mouth daily.    gabapentin 100 MG capsule  Commonly known as: Neurontin  Take 1 capsule (100 mg total) by mouth 2 (two) times daily. Home med.  What changed:   how much to take  how to take this  when to take this  additional instructions    topiramate 25 MG tablet  Commonly known as: TOPAMAX  Take 1 tablet (25 mg total) by mouth Nightly.     Medication Adherence and Access:  Missed doses?: {Blank multiple:19197::yes,no,***}  Uses pillbox?: {Blank multiple:19197::yes,no,***}  Anyone else assist with medication organization? {Blank multiple:19197::yes,no,***}  Current insurance coverage: ***  Preferred Pharmacy: ***   Medications affordable?: {Blank multiple:19197::yes,no,***}  Needs refills? {Blank multiple:19197::yes,no,***}    Allergies:   Allergies   Allergen Reactions    Lisinopril Anaphylaxis and Swelling    Losartan Dizziness    Hctz [Hydrochlorothiazide]      SIADH       Medications: Medications reviewed in EPIC medication station and updated today by the clinical pharmacist practitioner.  Current Outpatient Medications on File Prior to Visit   Medication Sig Dispense Refill    albuterol HFA 90 mcg/actuation inhaler Inhale 2 puffs every four (4) hours as needed for wheezing. 18 g 11    aspirin (ECOTRIN) 81 MG tablet Take 1 tablet (81 mg total) by mouth daily. 30 tablet 11    atorvastatin (LIPITOR) 40 MG tablet Take 1 tablet (40  mg total) by mouth daily. 90 tablet 3    blood sugar diagnostic (GLUCOSE BLOOD) Strp Disp test strips preferred by insurance plan. Testing qday, Dx: E11.9 (Type 2 DM- controlled) 30 strip 11    blood-glucose meter kit Disp. blood glucose meter kit preferred by patient's insurance. Check blood sugars as directed by provider. Dx: Diabetes, E11.9 1 each 1    candesartan (ATACAND) 16 MG tablet 1 po bid 60 tablet 11    cyclobenzaprine (FLEXERIL) 5 MG tablet Take 1 tablet (5 mg total) by mouth two (2) times a day as needed for muscle spasms. 60 tablet 5    DULoxetine (CYMBALTA) 30 MG capsule Take 1 capsule (30 mg total) by mouth daily. 30 capsule 11    fluPHENAZine decanoate (PROLIXIN) 25 mg/mL injection Inject 2 mL (50 mg total) into the muscle every thirty (30) days. 5 mL 11    furosemide (LASIX) 20 MG tablet TAKE 2 TABLETS BY MOUTH DAILY FOR 5 DAYS, THEN 1 TABLET DAILY 90 tablet 1    gabapentin (NEURONTIN) 100 MG capsule Take 1 capsule (100 mg total) by mouth Three (3) times a day. 90 capsule 2    glipiZIDE 2.5 mg Tab 1 PO q am for blood sugar. If blood sugar drops below 60, do not take. 30 tablet 11    inhalational spacing device (E-Z SPACER) Spcr 1 each by Miscellaneous route every four (4) hours as needed. 1 each 0    lancets Misc Disp. lancets #30 or amount allowed, Testing once Qday. Dx: E11.9 (Diabetes- controlled) 30 each 11    MEDICAL SUPPLY ITEM Entreal formula nutritionally supplement complete caloric density 3 application 3    senna (SENOKOT) 8.6 mg tablet Take 2 tablets by mouth nightly as needed for constipation. 60 tablet 0    tiotropium (SPIRIVA WITH HANDIHALER) 18 mcg inhalation capsule Place 1 capsule (18 mcg total) into inhaler and inhale daily as needed. 31 capsule 5    traMADol (ULTRAM) 50 mg tablet 1 po at bedtime prn pain 30 tablet 5    trolamine salicylate (ASPERCREME TOP) Apply 1 Application topically as needed (pain).       Current Facility-Administered Medications on File Prior to Visit   Medication Dose Route Frequency Provider Last Rate Last Admin    fluPHENAZine decanoate (PROLIXIN) injection 50 mg  50 mg Intramuscular Q30 Days Coralyn Helling, MD   50 mg at 12/16/23 1138       Assessment and Plan:     # ***        # ***       # Medication Management   Encouraged use of medication pill box and reviewed appropriate use. Reviewed the indication, dose, and frequency of each medication with patient.       Recommendations and medication-related problems were discussed directly with the patient's transitions of care physician, Dr. ***, immediately following the pharmacist visit prior to the physician visit. Questions/concerns were addressed to the patient's satisfaction. I spent a total of {NUMBERS; 0-45 BY 5:10291} minutes {VISIT MVHQ:46962} with the patient delivering clinical care and providing education/counseling.      Future Appointments   Date Time Provider Department Center   01/03/2024  8:40 AM FAMMED TRANSITION CLINIC [facility]   01/03/2024  9:20 AM Bernette Redbird, MD [facility]   01/10/2024  9:45 AM Coralyn Helling, MD PSYSTEPGR[facility]   01/13/2024 11:00 AM [facility]ILCOM NURSE OPT[facility]com [facility]   01/14/2024 10:40 AM Lestine Mount  Joelene Millin, MD [facility]   01/15/2024  9:45 AM Verlee Monte [place] RM 2 IUSUW [facility]   01/15/2024 10:45 AM Caryl Ada, MD URO2YMH [facility]   03/16/2024 11:30 AM PFT 1 UNCPULSPFUET [facility]   03/16/2024 12:30 PM Birder Robson, MD UNCPULSPCLET [facility]     ___________________________________________________

## 2024-01-03 NOTE — Unmapped (Signed)
[facility] SUBJECT FOR NEW MSG: Patient Advice Request Patient Name: Tammy Braun  Caller: [name] (Daughter)  Name of Caller: [name]  Contact Method: Telephone Call: Time- Any Time [phone]   Reason for Call: Patient was unable to make the appointment this morning and patient's daughter wanted to know if there was anyway Dr. Laymond Purser would still be able to see her later today. I informed them it would be a stretch but it was requested I send the message anyway. Please advise.   Previously Discussed: no  Appointment Offered: Declined  If offered accepted, scheduled appt date: NA

## 2024-01-03 NOTE — Unmapped (Signed)
Hi,    Patient has been rescheduled for Monday, 01/06/24 at 1:20 pm.    Thanks

## 2024-01-04 NOTE — Unmapped (Signed)
Printed and placed in your box for signature.

## 2024-01-06 ENCOUNTER — Ambulatory Visit: Admit: 2024-01-06 | Discharge: 2024-01-07 | Payer: MEDICARE

## 2024-01-06 LAB — CBC
HEMATOCRIT: 38 % (ref 34.0–44.0)
HEMOGLOBIN: 12.3 g/dL (ref 11.3–14.9)
MEAN CORPUSCULAR HEMOGLOBIN CONC: 32.5 g/dL (ref 32.0–36.0)
MEAN CORPUSCULAR HEMOGLOBIN: 28.9 pg (ref 25.9–32.4)
MEAN CORPUSCULAR VOLUME: 88.9 fL (ref 77.6–95.7)
MEAN PLATELET VOLUME: 7.9 fL (ref 6.8–10.7)
PLATELET COUNT: 527 10*9/L — ABNORMAL HIGH (ref 150–450)
RED BLOOD CELL COUNT: 4.27 10*12/L (ref 3.95–5.13)
RED CELL DISTRIBUTION WIDTH: 16.9 % — ABNORMAL HIGH (ref 12.2–15.2)
WBC ADJUSTED: 9.7 10*9/L (ref 3.6–11.2)

## 2024-01-06 LAB — COMPREHENSIVE METABOLIC PANEL
ALBUMIN: 3.2 g/dL — ABNORMAL LOW (ref 3.4–5.0)
ALKALINE PHOSPHATASE: 90 U/L (ref 46–116)
ALT (SGPT): 12 U/L (ref 10–49)
ANION GAP: 11 mmol/L (ref 5–14)
AST (SGOT): 11 U/L (ref <=34)
BILIRUBIN TOTAL: 0.3 mg/dL (ref 0.3–1.2)
BLOOD UREA NITROGEN: 13 mg/dL (ref 9–23)
BUN / CREAT RATIO: 12
CALCIUM: 10.1 mg/dL (ref 8.7–10.4)
CHLORIDE: 100 mmol/L (ref 98–107)
CO2: 27 mmol/L (ref 20.0–31.0)
CREATININE: 1.08 mg/dL — ABNORMAL HIGH (ref 0.55–1.02)
EGFR CKD-EPI (2021) FEMALE: 52 mL/min/{1.73_m2} — ABNORMAL LOW (ref >=60)
GLUCOSE RANDOM: 153 mg/dL (ref 70–179)
POTASSIUM: 4.6 mmol/L (ref 3.5–5.1)
PROTEIN TOTAL: 7.5 g/dL (ref 5.7–8.2)
SODIUM: 138 mmol/L (ref 135–145)

## 2024-01-06 LAB — VITAMIN B12: VITAMIN B-12: 288 pg/mL (ref 211–911)

## 2024-01-06 MED ORDER — POLYETHYLENE GLYCOL 3350 17 GRAM ORAL POWDER PACKET
PACK | Freq: Every day | ORAL | 3 refills | 90.00 days | Status: CP
Start: 2024-01-06 — End: ?

## 2024-01-06 NOTE — Unmapped (Addendum)
Orders:    Ambulatory referral for Smoking Cessation; Future

## 2024-01-06 NOTE — Unmapped (Signed)
ASSESSMENT AND PLAN    Will need letter from PCP re extra care hours.     Assessment & Plan  Chronic renal disease, stage 2, mildly decreased glomerular filtration rate (GFR) between 60-89 mL/min/1.73 square meter  Will check how her Cr is doing given recent hospitalization and the need for extra lasix.     Appears to be about her baseline  Orders:    Basic Metabolic Panel; Future    H/O: pneumonia  Seems to be doing well with good saturation. Will recheck WBC to ensure coming down.  Orders:    CBC; Future    Tobacco use disorder    Orders:    Ambulatory referral for Smoking Cessation; Future    Malignant neoplasm of urinary bladder, unspecified site (CMS-HCC)  With nephrostomy. Cont to monitor for infection      Orders:    Comprehensive Metabolic Panel    Vitamin B12 Level    Localized edema  Wt is up about 5 lbs since last month. While the edema could be from amlodipine suspect there is some fluid retention from increased salt intake. Will have her work on diet and take an extra lasix dose for 3 days. Already has an appt with her PCP next week.        Constipation, unspecified constipation type  Will start on miralax  Orders:    polyethylene glycol (MIRALAX) 17 gram packet; Take 17 g by mouth daily.    Primary hypertension  Improved with starting the amlodipine  Orders:    amlodipine (NORVASC) 10 MG tablet; Take 1 tablet (10 mg total) by mouth daily.        SUBJECTIVE    Tammy Braun is a 83 y.o. female who presents to the [facility] to discuss:    Pneumonia- Here for hospital f/u. Was hospitalized 12/25. Was there for a week and completed a course of antibiotic. Weaned to RA by discharge.     HTN- Stopped candasartan because of AKI in the hospital.  Amlodipine restarted because of this and she has been doing ok on it since.     Was in AFib in the hospital. Resolved by time of discharge.     E. Coli in nephrostimy bag. May have had a UTI as well.      B feet swelling. In the last day or so. Has been with other daughter who let's her eat more salt. On lasix 20mg  daily.     Tobacco use- smokes about a ppd. It is unclear from the patient what she has tried to do in the past to quit. Her daughter is in full support of her quitting.     Constipation- going on for a long time. Uses MOM occasionally.     Care needs- Would like to get more home health hours (through [facility] elder care). Will be starting home health PT ([facility]) after recent hospitalization.                     Health Maintenance  In terms of health maintenance she is due for   Health Maintenance Due   Topic Date Due    Medicare Annual Wellness Visit (AWV)  Never done    DTaP/Tdap/Td Vaccines (1 - Tdap) Never done    Zoster Vaccines (1 of 2) Never done    Lung Cancer Screening Shared Decision Making  Never done    Pneumococcal Vaccine 65+ (2 of 2 - PCV) 06/04/2018  COVID-19 Vaccine (3 - Pfizer risk series) 10/19/2020    Urine Albumin/Creatinine Ratio  12/14/2020    Retinal Eye Exam  07/19/2023    Influenza Vaccine (1) Never done         OBJECTIVE  BP 119/63 (BP Site: L Arm, BP Position: Sitting, BP Cuff Size: Large)  - Pulse 76  - Temp 36.8 ??C (98.2 ??F) (Temporal)  - Ht 154.9 cm (5' 1) Comment: pt reported - Wt 64.3 kg (141 lb 12.8 oz)  - SpO2 95%  - BMI 26.79 kg/m??   Wt Readings from Last 3 Encounters:   01/06/24 64.3 kg (141 lb 12.8 oz)   12/11/23 62 kg (136 lb 11.2 oz)   11/13/23 61.1 kg (134 lb 9.6 oz)       Gen: NAD  HENT: EOMI, no thyromegaly, no oropharyngeal erythema or exudate or LAD  CV: RRR no m/r/g  Lungs: CTAB  Abd: soft NT ND  Ext: +2 pitting edema below the knee      Labs  No visits with results within 1 Day(s) from this visit.   Latest known visit with results is:   Office Visit on 12/11/2023   Component Date Value    Hemoglobin A1C 12/11/2023 6.8 (H)     Estimated Average Glucose 12/11/2023 148     Sodium 12/11/2023 140     Potassium 12/11/2023 4.3     Chloride 12/11/2023 103     CO2 12/11/2023 27.0     Anion Gap 12/11/2023 10     BUN 12/11/2023 27 (H)     Creatinine 12/11/2023 1.05 (H)     BUN/Creatinine Ratio 12/11/2023 26     eGFR CKD-EPI (2021) Fema* 12/11/2023 54 (L)     Glucose 12/11/2023 119     Calcium 12/11/2023 10.2     Albumin 12/11/2023 3.8     Total Protein 12/11/2023 8.1     Total Bilirubin 12/11/2023 0.4     AST 12/11/2023 12     ALT 12/11/2023 <7 (L)     Alkaline Phosphatase 12/11/2023 98     CRP 12/11/2023 <4.0     Sed Rate 12/11/2023 53 (H)     WBC 12/11/2023 8.3     RBC 12/11/2023 4.49     HGB 12/11/2023 12.9     HCT 12/11/2023 39.7     MCV 12/11/2023 88.5     MCH 12/11/2023 28.7     MCHC 12/11/2023 32.5     RDW 12/11/2023 15.8 (H)     MPV 12/11/2023 8.4     Platelet 12/11/2023 271     Neutrophils % 12/11/2023 72.6     Lymphocytes % 12/11/2023 14.7     Monocytes % 12/11/2023 9.1     Eosinophils % 12/11/2023 3.1     Basophils % 12/11/2023 0.5     Absolute Neutrophils 12/11/2023 6.1     Absolute Lymphocytes 12/11/2023 1.2     Absolute Monocytes 12/11/2023 0.8     Absolute Eosinophils 12/11/2023 0.3     Absolute Basophils 12/11/2023 0.0

## 2024-01-06 NOTE — Unmapped (Addendum)
Improved with starting the amlodipine  Orders:    amlodipine (NORVASC) 10 MG tablet; Take 1 tablet (10 mg total) by mouth daily.

## 2024-01-06 NOTE — Unmapped (Addendum)
Will check how her Cr is doing given recent hospitalization and the need for extra lasix.   Orders:    Basic Metabolic Panel; Future

## 2024-01-06 NOTE — Unmapped (Signed)
Reduce your salt intake.   Take an extra lasix pill for the next three days.   Use miralax for constipation.

## 2024-01-10 ENCOUNTER — Ambulatory Visit: Admit: 2024-01-10 | Payer: MEDICARE

## 2024-01-13 ENCOUNTER — Ambulatory Visit: Admit: 2024-01-13 | Discharge: 2024-01-14 | Payer: MEDICARE

## 2024-01-13 MED ADMIN — fluPHENAZine decanoate (PROLIXIN) injection 50 mg: 50 mg | INTRAMUSCULAR | @ 16:00:00 | Stop: 2024-10-10

## 2024-01-13 NOTE — Unmapped (Signed)
ASSESSMENT AND PLAN:  Tammy Braun is a 83 y.o. female with parastomal pain status post neoadjuvant chemotherapy and radical cystectomy with Ileal Conduit on 10/05/2011 for T2N0M0 bladder cancer.    Given that she is over 5 years out from her cystectomy, she does not require further oncologic follow up. She has a large parastomal hernia with significant prolapse - she had a recent UTI. Loopogram negative for stricture or obstruction (11/08/22). No hydronephrosis on recent imaging from 12/2023. Because of the issues with pouching and UTI, she was curious about the potential for repairing this- and will send a referral for hernia repair to discuss these options and pros/cons of proceeding.    Recently had a CTAP for acute pain that demonstrated indeterminate lesion in the upper pole of the right kidney measures up to 2.9 cm. 1.7 cm indeterminate lesion in the midpole of the left. No hydronephrosis. Unable to personally review images as they were obtained at OSH. I discussed the natural history of these small masses and the risk of malignancy. We will obtain MRI with/wo contrast to further characterize.    PLAN:  Will obtain MRI with/without contrast to evaluate renal lesions.  Large parastomal hernia, reducible with prolapse. Gen surg referral to discuss pros/cons of surgery to repair   Schedule to see WOCN for irritation with pouching.     It was a pleasure seeing this delightful woman in my clinic today.    ATTENDING ATTESTATION: I saw and evaluated the patient, participating in the key portions of the service.  I reviewed the resident???s note.  I agree with the resident???s findings and plan.     Donnella Bi, MD  Professor, Department of Urology    REASON FOR VISIT:   Follow-up status post cystectomy - and ED visit with pyelo several months ago    HISTORY OF PRESENT ILLNESS:  Tammy Braun is a 83 y.o. 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    We have seen her for many years. She has a known stomal/parastomal hernia which has been managed conservatively.     She no longer needs oncologic surveillance and is followed annually with renal ultrasound and laboratory studies. She was last seen in my clinic in 01/16/2023.    05/2020  CT scan - no evidence of disease, stable parastomal hernia, mild R hydroureter (consistent with known reflux with conduit).   10/03/2021 RUS - no hydro, bilateral nephrolithiasis, bilateral cysts  05/10/2022 ED visit for pyelonephritis; CT showed R hydroureter with urothelial enhancement suggestive of pyelitis, new left mild hydro  11/08/22  Loopogram - No filling defects or stricturing. Normal reflux bilaterally   12/25/2023 CTAP non con with BL renal masses, indeterminate     She returns today and reports that she is doing well. Notes some irritation around the stomal site as well as white spots around the stoma.     Past medical, surgical, family, and social history were reviewed and remain unchanged.    MEDICATIONS:   Current Outpatient Medications   Medication Sig Dispense Refill    albuterol HFA 90 mcg/actuation inhaler Inhale 2 puffs every four (4) hours as needed for wheezing. 18 g 11    amlodipine (NORVASC) 10 MG tablet Take 1 tablet (10 mg total) by mouth daily.      aspirin (ECOTRIN) 81 MG tablet Take 1 tablet (  81 mg total) by mouth daily. 30 tablet 11    atorvastatin (LIPITOR) 40 MG tablet Take 1 tablet (40 mg total) by mouth daily. 90 tablet 3    blood sugar diagnostic (GLUCOSE BLOOD) Strp Disp test strips preferred by insurance plan. Testing qday, Dx: E11.9 (Type 2 DM- controlled) 30 strip 11    blood-glucose meter kit Disp. blood glucose meter kit preferred by patient's insurance. Check blood sugars as directed by provider. Dx: Diabetes, E11.9 1 each 1    candesartan (ATACAND) 16 MG tablet 1 po bid 60 tablet 11 cyclobenzaprine (FLEXERIL) 5 MG tablet Take 1 tablet (5 mg total) by mouth two (2) times a day as needed for muscle spasms. 60 tablet 5    DULoxetine (CYMBALTA) 30 MG capsule Take 1 capsule (30 mg total) by mouth daily. 30 capsule 11    fluPHENAZine decanoate (PROLIXIN) 25 mg/mL injection Inject 2 mL (50 mg total) into the muscle every thirty (30) days. 5 mL 11    furosemide (LASIX) 20 MG tablet TAKE 2 TABLETS BY MOUTH DAILY FOR 5 DAYS, THEN 1 TABLET DAILY 90 tablet 1    gabapentin (NEURONTIN) 100 MG capsule Take 1 capsule (100 mg total) by mouth Three (3) times a day. 90 capsule 2    inhalational spacing device (E-Z SPACER) Spcr 1 each by Miscellaneous route every four (4) hours as needed. 1 each 0    lancets Misc Disp. lancets #30 or amount allowed, Testing once Qday. Dx: E11.9 (Diabetes- controlled) 30 each 11    MEDICAL SUPPLY ITEM Entreal formula nutritionally supplement complete caloric density 3 application 3    MEDICAL SUPPLY ITEM Compression stockings knee high 2 each 0    polyethylene glycol (MIRALAX) 17 gram packet Take 17 g by mouth daily. 90 packet 3    senna (SENOKOT) 8.6 mg tablet Take 2 tablets by mouth nightly as needed for constipation. 60 tablet 0    tiotropium (SPIRIVA WITH HANDIHALER) 18 mcg inhalation capsule Place 1 capsule (18 mcg total) into inhaler and inhale daily as needed. 31 capsule 5    traMADol (ULTRAM) 50 mg tablet 1 po at bedtime prn pain 30 tablet 5    trolamine salicylate (ASPERCREME TOP) Apply 1 Application topically as needed (pain).       Current Facility-Administered Medications   Medication Dose Route Frequency Provider Last Rate Last Admin    fluPHENAZine decanoate (PROLIXIN) injection 50 mg  50 mg Intramuscular Q30 Days Coralyn Helling, MD   50 mg at 01/13/24 1052     ALLERGIES:    Allergies   Allergen Reactions    Lisinopril Anaphylaxis and Swelling    Losartan Dizziness    Hctz [Hydrochlorothiazide]      SIADH     ROS:   Negative upon 10-system review other than what is mentioned in the HPI.     PHYSICAL EXAMINATION:   GENERAL: Pleasant female in no acute distress.   VITAL SIGNS: Blood pressure 138/69, pulse 73, temperature 36.7 ??C (98.1 ??F), temperature source Temporal, height 154.9 cm (5' 1), weight 60.8 kg (134 lb 1.6 oz), not currently breastfeeding.  PULMONARY: Normal work of breathing, no use of accessory muscles  ABDOMEN: Soft, non-tender, non-distended.  No organomegaly. Soft reducible peristomal hernia noted. Incision sites are clean, dry, intact. Stoma is prolapsed, but pink with clear urine in bag. Slightly whitish tinge to skin around stoma- normal appearing  PSYCHOLOGIC: Normal affect, normal mood    IMAGING:  XR Shoulder 3 Or More Views  Left  Narrative: EXAM: XR SHOULDER 3 OR MORE VIEWS LEFT  DATE: 10/29/2023 1:30 PM  ACCESSION: [id number]  DICTATED: 10/29/2023 1:39 PM  INTERPRETATION LOCATION: [facility]    CLINICAL INDICATION: 83 years old Female with left shoulder pain  - M25.512 - Acute pain of left shoulder        COMPARISON: Left shoulder radiographs 01/19/2021, CT chest 06/06/2023    TECHNIQUE: AP, Grashey, outlet, and axillary views of the left shoulder.    FINDINGS:  Postsurgical changes of reverse shoulder arthroplasty without perihardware lucency or hardware fracture. No evidence of acute fracture or dislocation. Joint space narrowing and bony overgrowth of the acromioclavicular joint.    Diffuse osteopenia. The visualized left lung is clear. Large hiatal hernia, similar to prior. Extensive aortic calcifications.  Impression: Postsurgical changes of left shoulder arthroplasty without hardware complication.  Severe osteoarthrosis of the acromioclavicular joint.    LABS:  Office Visit on 01/06/2024   Component Date Value Ref Range Status    Sodium 01/06/2024 138  135 - 145 mmol/L Final    Potassium 01/06/2024 4.6  3.5 - 5.1 mmol/L Final    Chloride 01/06/2024 100  98 - 107 mmol/L Final    CO2 01/06/2024 27.0  20.0 - 31.0 mmol/L Final    Anion Gap 01/06/2024 11  5 - 14 mmol/L Final    BUN 01/06/2024 13  9 - 23 mg/dL Final    Creatinine 81/19/1478 1.08 (H)  0.55 - 1.02 mg/dL Final    BUN/Creatinine Ratio 01/06/2024 12   Final    eGFR CKD-EPI (2021) Female 01/06/2024 52 (L)  >=60 mL/min/1.50m2 Final    eGFR calculated with CKD-EPI 2021 equation in accordance with [employer] and [employer] of Nephrology Task Force recommendations.    Glucose 01/06/2024 153  70 - 179 mg/dL Final    Calcium 29/56/2130 10.1  8.7 - 10.4 mg/dL Final    Albumin 86/57/8469 3.2 (L)  3.4 - 5.0 g/dL Final    Total Protein 01/06/2024 7.5  5.7 - 8.2 g/dL Final    Total Bilirubin 01/06/2024 0.3  0.3 - 1.2 mg/dL Final    AST 62/95/2841 11  <=34 U/L Final    ALT 01/06/2024 12  10 - 49 U/L Final    Alkaline Phosphatase 01/06/2024 90  46 - 116 U/L Final    Vitamin B-12 01/06/2024 288  211 - 911 pg/ml Final    WBC 01/06/2024 9.7  3.6 - 11.2 10*9/L Final    RBC 01/06/2024 4.27  3.95 - 5.13 10*12/L Final    HGB 01/06/2024 12.3  11.3 - 14.9 g/dL Final    HCT 32/44/0102 38.0  34.0 - 44.0 % Final    MCV 01/06/2024 88.9  77.6 - 95.7 fL Final    MCH 01/06/2024 28.9  25.9 - 32.4 pg Final    MCHC 01/06/2024 32.5  32.0 - 36.0 g/dL Final    RDW 72/53/6644 16.9 (H)  12.2 - 15.2 % Final    MPV 01/06/2024 7.9  6.8 - 10.7 fL Final    Platelet 01/06/2024 527 (H)  150 - 450 10*9/L Final

## 2024-01-13 NOTE — Unmapped (Signed)
 Administered long-acting injectable today,. See MAR for additional documentation. Sharyl Nimrod, CMA

## 2024-01-13 NOTE — Unmapped (Signed)
Patient came in today for injection, pt also stated she could not make Fridays appt due to transportation and the weather.     Pt told writer she has been hearing a lot of voices lately. Writer just wanted to inform provider.

## 2024-01-14 ENCOUNTER — Ambulatory Visit: Admit: 2024-01-14 | Discharge: 2024-01-15 | Payer: MEDICARE

## 2024-01-14 MED ORDER — MEDICAL SUPPLY ITEM
0 refills | 0.00 days | Status: CP
Start: 2024-01-14 — End: ?

## 2024-01-14 NOTE — Unmapped (Addendum)
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       Nervous and Auditory    Peripheral neuropathy       Genitourinary    Chronic renal disease, stage 2, mildly decreased glomerular filtration rate (GFR) between 60-89 mL/min/1.73 square meter    Relevant Orders    Ambulatory referral to Nephrology       Other    Tobacco use disorder- Repeated qiuit recs and attempts - Primary (Chronic)    Schizoaffective disorder (CMS-HCC)    History of bladder cancer     Other Visit Diagnoses         Malignant neoplasm of urinary bladder, unspecified site (CMS-HCC)          Myelomalacia of cervical cord (CMS-HCC)                Talk with social worker  Stockings  Hold on BP right now  Good that not smoking much (though one daughter still gives)  Refills   Referral nephrology per daughter request  Follow in one month  Labs A1c one month  Clarified meds  Updated problem list annotations  Database review and update  Med review and update  Lab review and update  Counseling provided and patient education    40 minutes with patient- 1/2 in face to face counseling on chronic health conditions and  recent hospital    Patient is aware of the services offered, that only one practitioner can furnish CCM, that there may be cost-sharing, and that they may withdraw at any time.         Patient Consent Status   During today's visit:   Who provided consent for Chronic Care Management? Patient  Patient consented by verbal consent       Anxiety                      Take your medications exactly as directed  Go to your counseling sessions and follow-up appointments  Get adequate amounts of sleep each night  Relieve tension by performing physical activity 3 to 5 times per week for at least 15 minutes each time  Avoid alcohol, caffeine, nicotine, and illegal drugs which can all increase anxiety           Tobacco Use                      Avoid the use of tobacco containing products  If you need help to quit smoking, please discuss options with your healthcare provider  Ask family, friends, and coworkers for support  Join a support group for people who are trying to quit smoking  Set a quit date  Consider signing up for a smoking cessation program.  Talk with your healthcare provider for more details  Ask your doctor about various medications that can help you to quit smoking  Eat a healthy diet and get regular exercise  Find alternative ways of relieving your stress.      If the patient meets clinical pharmacist/clinical pharmacist practitioner referral criteria, I authorize the  [facility] clinical pharmacist/clinical pharmacist practitioner to adjust the medication regimen to assist the patient to safely reach their care plan goals. I understand the pharmacist will provide progress documentation to me via Epic and engage me if the patient's needs exceed the scope of the care plan.    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       At maximum risk for fall       Right hip pain       Fungal skin infection       Disorder of left rotator cuff       Narrowing of cervical spine       Pain in left shoulder       Articular cartilage disorder of shoulder region, left       Ringing in the ears       Slow heart rate Skin breakdown       Influenza vaccination declined       Chronic tension headache       Degenerative arthritis of cervical spine       Arthralgia of both ankles       Frequent PVCs       Carotid arterial disease (CMS-HCC) < 40% bilaterally 2021       High blood pressure       Nonrheumatic tricuspid valve regurgitation       Pulmonary hypertension       Disorder of urinary tract       Osteoporosis       Other artificial openings of urinary tract status       Cervical spine arthritis       HISTORY OF BLADDER CANCER       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 female that presents to clinic today regarding the following issues:    PMH Chronic issues, including: COPD, Neuropathy, Chronic pain, HTN, DJD neck, hip, cervical stenosis, cervical radiculopathy, Chronic renal disease, Depression , Onychomycosis, Emphysema, Ca Trigone Bladder (+ parastomal pain status post neoadjuvant chemotherapy and radical cystectomy with Ileal Conduit on 10/05/2011 for T2N0M0 bladder cancer), Cervical Myofascial pain with chronic tension headaches with medication overuse headaches (Goody powders), left reverse total shoulder arthroplasty, large hiatal hernia, Pyelonephritis, Kidney stones       Recently hospitalized [place] UTI  Admission note said: 83 y.o. female with medical history significant of bladder cancer (s/p bladder removal w/ ileal-conduit with urostomy), HTN, DM, COPD, CAD, schizophrenia, depression with psychosis, CKD-3A, UTI, PVC, tobacco abuse, who presents with weakness and cough. cough with brownish  colored sputum production. Has mild SOB, Severe sepsis [facility]): Patient has severe sepsis with WBC 27.9, heart rate up to 136, RR 25. Lactic acid 2.8 --> 2.3. This is likely due to combination of CAP and UTI. Patient was initially hypotensive with blood pressure 77/66, which improved return 138/81 after giving 3 L of LR bolus in ED.   Sepsis resolved --CT a/p showed Left lower lobe airspace disease concerning for pneumonia. Leukocytosis, procal 14.97.  --patient received 1 dose of vancomycin, cefepime and Flagyl in ED   --completed 5 days of ceftriaxone and doxy   Presumed UTI (urinary tract infection)   ileal-conduit with urostomy  --UA mod leuk and many bacteria. Pt can not experience dysuria, so was treated as UTI.  --urine cx pos for pan-sensitive E coli  --completed 3 days of ceftriaxone    Trop elevation 2/2 demand ischemia  NSTEMI, ruled out  --trop peaked at 202     New onset atrial fibrillation w RVR  136 --> 70s now. CHADS2 score is 6.   -started IV heparin  --daughter declines anticoagulation  - outpt cardiology f/u     Daughter stopped cigarettes  Some headaches  May be due to withdrawal    Some edema ankles  Wants stockings  On amlodipine    DM off anything  Check labs next visit     reports that she has been smoking cigarettes. She started smoking about 65 years ago. She has a 65.8 pack-year smoking history. She has never used smokeless tobacco.    The following portions of the patient's history were reviewed and updated as appropriate: allergies, current medications, past family history, past medical history, past social history, past surgical history and problem list.    Review of systems for 10 organ systems conducted and no significant positives      OBJECTIVE:    Vital signs have been reviewed:   Vitals:    01/14/24 1016   BP: 135/85   Pulse: 54   Temp:

## 2024-01-15 ENCOUNTER — Ambulatory Visit: Admit: 2024-01-15 | Discharge: 2024-01-15 | Payer: MEDICARE

## 2024-01-15 DIAGNOSIS — K435 Parastomal hernia without obstruction or  gangrene: Principal | ICD-10-CM

## 2024-01-15 DIAGNOSIS — F259 Schizoaffective disorder, unspecified: Principal | ICD-10-CM

## 2024-01-15 DIAGNOSIS — Z8551 Personal history of malignant neoplasm of bladder: Principal | ICD-10-CM

## 2024-01-15 DIAGNOSIS — I10 Essential (primary) hypertension: Principal | ICD-10-CM

## 2024-01-15 DIAGNOSIS — N289 Disorder of kidney and ureter, unspecified: Principal | ICD-10-CM

## 2024-01-15 NOTE — Unmapped (Signed)
WOCN Consult Services  OSTOMY CLINIC VISIT NOTE     Reason for Consult:   - Ostomy Care  - Ostomy Teaching    Problem List:   Active Problems:    * No active hospital problems. *    Assessment: Pt assessed in clinic with daughter. Daughter is pts primary caregiver for pt. Pt currently in 2 piece moldable pouch Convatec. Pouch wear time is lasting 3 days.    Pt with plastic tape along edges of wafers. Per daughter pt is supposed to be receiving Hi-tape but the DME supplier reports being out of and is sending plastic tape as a substitute.     Removed pts pouch and daughter is curious about other pouching options. Pt with prolapsed stoma that measures at two inches and hernia. Daughter opens wider than 2 inches as pt reports pain with movement with wafer cut to two inches.     Placed pt in Coloplast 1 piece flexible drainable pouch. Discussed other pouching options with daughter but explained that they are unable to be tested at this time d/t no available samples large enough to accommodate stoma. Daughter given number for outpatient WOC and recommended that she sign up for coloplast cares to receive samples of pouches for her mom to test. Daughter does not wish to be in pouching system that requires cutting.     Daughter voiced that she intends to leave Coloplast pouch in place to test wear time as her mom reported it feeling comfortable.     Pt could benefit from [employer] Urostomy Pouch [phone]).    Pt having referral for possible hernia repair. If pt is unable to have hernia repair, will fit pt for hernia belt.     Stoma Type:  -  Urostomy Stoma Location:  - RUQ (Right Upper Quadrant)     Stoma Characteristics:  - Round  - Budded Stoma Mucosal Condition and Color:  - Moist  - Red     Mucocutaneous Junction:  - Intact    Rod/Stents:  - No     Output:  - Yellow  - Clear     Peristomal Skin Condition:   - Intact    Abdominal Contours:  - Rounded  - Soft  - hernia    Pouching System:  - 2 Piece  - Moldable  - Moldable barrier ring Anticipated Wear Time of Pouching System:  - 3 to 5 days     Teaching/Instructions:  - Measuring stoma size and cutting skin barrier appropriately.    Recommendations/Plan:   - Continue with current pouching system.    Plan of Care Discussed With:  - Patient  - Family    Ostomy Supplies:   - Ostomy pouching samples provided    Ostomy Product List:  Pt to continue with current pouching system she is receiving.     WOCN Follow Up:  - We will sign off at this time    Workup Time:  60 minutes    Two Wound Ostomy and Continence Nurses involved in patient evaluation. Ovidio Kin, CWOCN    Leslye Peer BSN, RN, [employer]  Available on vocera  *33 Leslye Peer from desk phone  Epic CHAT

## 2024-01-15 NOTE — Unmapped (Signed)
[facility] FAMILY MEDICINE [place] POPULATION HEALTH  Care Management Progress Note    Date: 01/15/2024  Outcome:  Phone outreach completed    Purpose of contact:           PCP referral - family requested more at-home care hours. Called patient's daughter, [name], to confirm information. Patient requested more personal care hours. Major medical changes: patient has been in hospital, patient declined rehab and family is unable to get patient to consider this.     Per daughter, patient would benefit greatly with hours being increased. She states the patient is weaker, has been hospitalized. Currently receiving 42 hours per week, which patient's daughter states is not sufficient for patient at this time.    Per [name], Patient and trying to work with community care worker Ray[place] Citizen). Transportation is a barrier and patient and family are trying to get traveling/gas vouchers for patient transportation. CM will investigate options.     Patient currently receiving services with [facility] - Marletta Lor is case manager. Unsure if patient is receiving this through [facility] with [employer] as a provider, as pt has Medicaid.     Unable to locate prior [facility] forms for PCS - called [place] [facility] to confirm if patient is receiving PCS services and spoke with Candice. Per [place] [facility], patient does not have an active PCS account. Services ended in July 2015.    Contacted patient's provider TEFL teacher and spoke with Marletta Lor. Confirmed patient receiving services from CAP/DA through [employer]. Per case manager, patient already receiving maximum services and it may not be possible to justify additional hours. If patient's PCP believes the patient needs more than the current amount, must submit a detailed order that states patient cannot be left alone. Please note that they do not provide overnight care.     Barriers to Care:  Lack of in-home support: more PCS hours requested and Transportation barrier: difficult to utilize [employer] transportation, longer drive from [employer] for [employer].    Provider/Care Partner(s) to follow up on:   If PCP believes hours increase medically needed, place detailed order for increased aide hours stating medical reasons for this and CM can fax to [facility]  Recommendation for Mobility Evaluation: Please use .mobilityeval to provide required documentation, then enter a referral for a wheelchair seating evaluation. [facility] and surrounding areas: ORDER Ambulatory Referral to Physical Therapy.  For to dept, select [facility].  Be sure to check the wheelchair box and include the COMMENT power mobility seating evaluation.    Additional Information/Plan:  Patient provided my direct contact information and encouraged to contact me should additional needs arise.    Time Spent Per Day:  Chart review was completed prior to outreach attempt.   01/15/2024: 30      Lavella Hammock, MSW  [employer] FAMILY [facility]IC[facility] [place]

## 2024-01-15 NOTE — Unmapped (Signed)
Called regarding increased voices that she complained of while getting her LAI. She says they are of old people she knows, do not command her to do things, and are quieter since getting her LAI. She is interested in an LAI dose increase and prefers to not take PO. She is okay to wait till 1/31 to further discuss.

## 2024-01-21 DIAGNOSIS — F259 Schizoaffective disorder, unspecified: Principal | ICD-10-CM

## 2024-01-21 DIAGNOSIS — I10 Essential (primary) hypertension: Principal | ICD-10-CM

## 2024-01-21 NOTE — Unmapped (Signed)
[facility] FAMILY MEDICINE [place] POPULATION HEALTH  Care Management Progress Note    Date: 01/21/2024  Outcome:  Phone contact completed    Purpose of contact:         CM returned call. Patient's daughter requested medical records request for her mother's most recent hospitalization. Discussed how patient's daughter gets these records via MyChart or records office. Stated she thinks it will be easier to get it from [facility] but will call back later if in need of assistance.    Patient's daughter currently working with more than one case manager and stated some confusion related to this. Family working with case Teaching laboratory technician with TEFL teacher and [employer] [place]. Discussed different CM roles with patient's daughter.    Provided update on CAP/DA hours from last week. Patient's daughter states understanding that it may be difficult to get an hours increase but would like to proceed.    Additional Information/Plan:  Patient provided my direct contact information and encouraged to contact me should additional needs arise.    Time Spent Per Day:  Chart review was completed prior to outreach attempt.   01/21/2024: 10      Lavella Hammock, MSW  [facility] FAMILY MEDICINE [place]

## 2024-01-22 NOTE — Unmapped (Signed)
[facility] Consult Services   Telephone Call    Service: WOCN        Reason For Call:  - Follow-up to WOCN consult 01/14/23    Actions Taken:  -  Left voicemail message     Instructions Provided:  -  Please call [phone] for follow up     Follow Up:  -  Patient will contact outpatient WOCN for follow-up    Workup Time:   15 minutes    Dorothyann Gibbs MSN RN [facility]  [facility] Consult Services   [phone]  [email or web address]

## 2024-01-24 ENCOUNTER — Inpatient Hospital Stay: Admit: 2024-01-24 | Discharge: 2024-01-25 | Payer: MEDICARE

## 2024-01-24 MED ADMIN — gadopiclenol (ELUCIREM,VUEWAY) injection 0.1 mL: .1 mL | INTRAVENOUS | @ 19:00:00 | Stop: 2024-01-24

## 2024-01-24 MED ADMIN — gadopiclenol (ELUCIREM,VUEWAY) injection 5.9 mL: 5.9 mL | INTRAVENOUS | @ 21:00:00 | Stop: 2024-01-24

## 2024-01-24 NOTE — Unmapped (Signed)
[facility] Specialty and Home Delivery Pharmacy Clinic Administered Medication Refill Coordination Note      NAME:Tammy Braun DOB: 1941-07-08      Medication: fluphenazine decanoate    Day Supply:  30 days      SHIPPING      Next delivery from [facility] Delivery Pharm[facility] [phone]) to Adult Psychiatry Clinic for [name] is scheduled for 01/28.      Patient's next nurse visit for administration: 01/31.    We will follow up with clinic monthly for standard refill processing and delivery.      Klyn Kroening Samella Parr  [facility] Specialty and [facility]

## 2024-01-27 MED FILL — FLUPHENAZINE DECANOATE 25 MG/ML INJECTION SOLUTION: INTRAMUSCULAR | 30 days supply | Qty: 5 | Fill #2

## 2024-01-27 NOTE — Unmapped (Signed)
You have a fax in your box requiring your signature.

## 2024-01-29 NOTE — Unmapped (Signed)
[facility] Consult Services   Telephone Call    Service:WOCN        Reason For Call:  - Pouching problem    Assessment:   Pt requested information regarding ostomy supply options. Pt stated now that she is happy with her current system and does not want to trial a different system.     Actions Taken:  -  Gave patient the Outpt ostomy number should she need assistance in the future.     Instructions Provided:  -  [phone]     Follow Up:  -  Patient will contact outpatient WOCN for follow-up    Workup Time:   15 minutes    Dorothyann Gibbs MSN RN [facility]  [facility] Consult Services   [phone]  [email or web address]

## 2024-01-30 NOTE — Unmapped (Signed)
Addended byGwenlyn Fudge, Viyan Rosamond on: 01/30/2024 10:02 AM     Modules accepted: Level of Service

## 2024-01-31 ENCOUNTER — Ambulatory Visit: Admit: 2024-01-31 | Discharge: 2024-02-01 | Payer: MEDICARE

## 2024-01-31 DIAGNOSIS — F17200 Nicotine dependence, unspecified, uncomplicated: Principal | ICD-10-CM

## 2024-01-31 DIAGNOSIS — Z79899 Other long term (current) drug therapy: Principal | ICD-10-CM

## 2024-01-31 DIAGNOSIS — F259 Schizoaffective disorder, unspecified: Principal | ICD-10-CM

## 2024-01-31 NOTE — Unmapped (Signed)
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 Overall stable in terms of mood and psychotic symptoms. Discussed using the senior center more frequently to better occupy her time. Patient remains pre contemplative in regards to TUD.     Identifying Information:  Tammy Braun is a 83 y.o. female with a history of schizoaffective disorder. First presented to [facility]EP clinic September 2021. She has been stable on prolixin 50 mg q30 days for the past 30 years. Per PCP, patient has had worsening of depressive symptoms, though patient did not endorse. Reports auditory hallucinations and denies any CAH. Reports some anxiety related to impatience waiting on others, but finds this manageable. Patient is not interested in any new medications, but could consider increasing the frequency of prolixin administration if needed. March 2022, augmenting with mirtazapine for low mood and sleep. Could also consider augmenting with SRNI or wellbutrin for depression.     Risk Assessment:  A full suicide and violence risk assessment was performed as part of this patient's initial evaluation with [facility] outpatient psychiatry.  There is no new acute risk for suicide or violence at this time. The patient was educated about relevant modifiable risk factors including following recommendations for treatment of psychiatric illness and abstaining from substance abuse.   While future psychiatric events cannot be accurately predicted, the patient does not currently require acute inpatient psychiatric care and does not currently meet [facility] involuntary commitment criteria.      Plan:    Problem: Schizoaffective disorder  Status of problem: chronic and stable  Interventions:   - Continue Prolixin 50 mg IM q30 days    # TUD  - denies wanting to quit at this time      #High Risk Medication Monitoring   Lab Results   Component Value Date A1C 6.8 (H) 12/11/2023      Lab Results   Component Value Date    CHOL 157 11/02/2022    CHOL 179 11/03/2021    CHOL 161 09/24/2019     Lab Results   Component Value Date    HDL 51 11/02/2022    HDL 50 11/03/2021    HDL 50 09/24/2019     Lab Results   Component Value Date    LDL 88 11/02/2022    LDL 107 (H) 11/03/2021    LDL 88 09/24/2019     Lab Results   Component Value Date    VLDL 18.4 11/02/2022    VLDL 21.8 11/03/2021    VLDL 23.4 09/24/2019     Lab Results   Component Value Date    CHOLHDLRATIO 3.1 11/02/2022    CHOLHDLRATIO 3.6 11/03/2021    CHOLHDLRATIO 3.2 09/24/2019     Lab Results   Component Value Date    TRIG 92 11/02/2022    TRIG 109 11/03/2021    TRIG 117 09/24/2019      BP Readings from Last 3 Encounters:   01/31/24 148/75   01/15/24 138/69   01/14/24 135/85       Psychotherapy provided:  No billable psychotherapy service provided.    Patient has been given this writer's contact information as well as the [facility] Psychiatry urgent line number. The patient has been instructed to call 911 for emergencies.    Patient was seen and plan of care was discussed with the Attending MD,Dr. Dominic Pea, who agrees with  the above statement and plan.    Subjective:    Interval History:     Denies mood/psychosis issues other than infrequent AH. Brought on by stress usually.    She does not feel a need for a medications change. Discussed starting to utilize senior center for social interaction and reducing smoking (10-15 cigs per day). She is pre contemplative at this time.     Objective:    Mental Status Exam:  Appearance:    Appears stated age   Motor:   No abnormal movements   Speech/Language:    Normal rate, volume, tone, fluency and Impaired articulation   Mood:   Good   Affect:   Euthymic   Thought process and Associations:   Logical, linear, clear, coherent, goal directed   Abnormal/psychotic thought content:     Denies SI, HI, self harm, delusions, obsessions, paranoid ideation, or ideas of reference   Perceptual disturbances:     Endorses infrequent auditory hallucinations. Most often in the AM or under stress.   Other:   Insight, judgment, and impulse control are fair     Visit was completed face to face.    I personally spent 30 minutes face-to-face and non-face-to-face in the care of this patient, which includes all pre, intra, and post visit time on the date of service.  All documented time was specific to the E/M visit and does not include any procedures that may have been performed.       Coralyn Helling, MD

## 2024-01-31 NOTE — Unmapped (Signed)
 Follow-up instructions:  - Please continue taking your medications as prescribed for your mental health.   - Do not make changes to your medications, including taking more or less than prescribed, unless under the supervision of your physician. Be aware that some medications may make you feel worse if abruptly stopped  - Please refrain from using illicit substances, as these can affect your mood and could cause anxiety or other concerning symptoms.   - Seek further medical care for any increase in symptoms or new symptoms such as thoughts of wanting to hurt yourself or hurt others.     Contact info:  Life-threatening emergencies: Call 911, the 988 suicide and crisis lifeline, or go to the nearest ER for medical or psychiatric attention.           Issues that need urgent attention but are not life threatening: Call the clinic outpatient front desk for assistance:    - [phone] for [facility] adult psychiatry clinics located at [address] [facility]  - [phone] for [facility] child and adolescent psychiatry clinics located at [address]  - [phone] for Scharlene Gloss and adult psychiatry clinics located at [address] [facility]    Non-urgent routine concerns and questions: Send a message through [facility] or call our clinic front desk.    Refill requests: Check with your pharmacy to initiate refill requests.    Regarding appointments:  - If you need to cancel your appointment, we ask that you call your clinic at least 24 hours before your scheduled appointment at the numbers listed above.  - If for any reason you arrive 15 minutes later than your scheduled appointment time, you may not be seen and your visit may be rescheduled.  - Please remember that we will not automatically reschedule missed appointments.  - If you miss two (2) appointments without letting us know in advance, you will likely be referred to a provider in your community.  - We will do our best to be on time. Sometimes an emergency will arise that might cause your clinician to be late. We will try to inform you of this when you check in for your appointment. If you wait more than 15 minutes past your appointment time without such notice, please speak with the front desk staff.    In the event of bad weather, the clinic staff will attempt to contact you, should your appointment need to be rescheduled. Additionally, you can call the Patient Weather Line [phone] for system-wide clinic status    For more information and reminders regarding clinic policies (these were provided when you were admitted to the clinic), please ask the front desk.

## 2024-02-04 NOTE — Unmapped (Addendum)
[facility] Order: [id number] and [id number]   Printed and placed in your box for signature

## 2024-02-05 NOTE — Unmapped (Signed)
 I saw and evaluated the patient, participating in the key portions of the service.  I reviewed the resident???s note.  I agree with the resident???s findings and plan. Augusto Garbe, MD

## 2024-02-06 NOTE — Unmapped (Signed)
[facility] requesting Addendum  Printed and placed in your box for signature

## 2024-02-10 ENCOUNTER — Ambulatory Visit: Admit: 2024-02-10 | Discharge: 2024-02-11 | Payer: MEDICARE

## 2024-02-10 MED ADMIN — fluPHENAZine decanoate (PROLIXIN) injection 50 mg: 50 mg | INTRAMUSCULAR | @ 16:00:00 | Stop: 2024-10-10

## 2024-02-10 NOTE — Unmapped (Signed)
Chart review.    Treyana Sturgell,RN - Case Manager   [facility] - Population Health Clinical Services  [address],  [place], [place] [address]  p. [phone]   [email or web address]

## 2024-02-10 NOTE — Unmapped (Signed)
Patient came in today for injection only visit. Patients medication was patient supplied.  Prolixin dec 50 mg administered via intramuscular injection to patient's right deltoid. Patient tolerated injection well. Next injection scheduled for 4 weeks. See MAR for further details.     NDC: 16109-604-54  LOT: [id number]  EXP: 07/30/2024    Next appointment scheduled for 03/11/24 @ 11am

## 2024-02-11 NOTE — Unmapped (Signed)
[facility] Home Health Order [id number] printed and placed in your box for signature

## 2024-02-18 ENCOUNTER — Ambulatory Visit: Admit: 2024-02-18 | Discharge: 2024-02-19 | Payer: MEDICARE

## 2024-02-18 DIAGNOSIS — F259 Schizoaffective disorder, unspecified: Principal | ICD-10-CM

## 2024-02-18 DIAGNOSIS — F17200 Nicotine dependence, unspecified, uncomplicated: Principal | ICD-10-CM

## 2024-02-18 DIAGNOSIS — G9589 Other specified diseases of spinal cord: Principal | ICD-10-CM

## 2024-02-18 DIAGNOSIS — G603 Idiopathic progressive neuropathy: Principal | ICD-10-CM

## 2024-02-18 DIAGNOSIS — Z8551 Personal history of malignant neoplasm of bladder: Principal | ICD-10-CM

## 2024-02-18 DIAGNOSIS — I639 Cerebral infarction, unspecified: Principal | ICD-10-CM

## 2024-02-18 DIAGNOSIS — E1149 Type 2 diabetes mellitus with other diabetic neurological complication: Principal | ICD-10-CM

## 2024-02-18 DIAGNOSIS — I10 Essential (primary) hypertension: Principal | ICD-10-CM

## 2024-02-18 MED ORDER — GLIPIZIDE 2.5 MG TABLET
ORAL_TABLET | 5 refills | 0.00 days | Status: CP
Start: 2024-02-18 — End: ?

## 2024-02-18 MED ORDER — DILTIAZEM CD 120 MG CAPSULE,EXTENDED RELEASE 24 HR
ORAL_CAPSULE | Freq: Every day | ORAL | 11 refills | 30.00 days | Status: CP
Start: 2024-02-18 — End: 2025-02-17

## 2024-02-18 NOTE — Unmapped (Signed)
 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 female that presents to clinic today regarding the following issues:    Check up  Feels ok  Some edema in legs  Sits all day    Seen by me 1 month ago  Some edema then  On amlodipine  To wear compression stockings  Did not get  Sen psychiatry  Says smoking less    Seen Urology last month too  Parastomal pain status post neoadjuvant chemotherapy and radical cystectomy with Ileal Conduit on 10/05/2011 for T2N0M0 bladder cancer.   No hydronephrosis on recent imaging from 12/2023 - pouching and UTI     MRI showed:  Moderate stable bilateral hydronephrosis. Right lower quadrant ileal conduit. The etiology of hydronephrosis is indeterminate but could be secondary to reflux.      Multiple cysts in the kidneys bilaterally without evidence of solid enhancing mass.      Mild biliary ductal dilatation with the distal common bile duct stone measuring 0.8 cm (series 3 image 14).      Redemonstration of large hiatal hernia containing stomach, colon and pancreas. Unchanged right-sided position of the stomach compared to prior.       PMH + COPD, Neuropathy, DM, Tobacco Abuse, Chronic pain, HTN, DJD neck, hip, cervical stenosis, cervical radiculopathy, Chronic renal disease, Depression , Onychomycosis, Ca Trigone Bladder (+ parastomal pain status post neoadjuvant chemotherapy and radical cystectomy with Ileal Conduit on 10/05/2011 for T2N0M0 bladder cancer), Cervical Myofascial pain, chronic tension headaches with medication overuse (Goody powders), left reverse total shoulder arthroplasty, large hiatal hernia, Pyelonephritis, Kidney stones      reports that she has been smoking cigarettes. She started smoking about 65 years ago. She has a 65.9 pack-year smoking history. She has never used smokeless tobacco.    The following portions of the patient's history were reviewed and updated as appropriate: allergies, current medications, past family history, past medical history, past social history, past surgical history and problem list.    Review of systems for 10 organ systems conducted and no significant positives      OBJECTIVE:    Vital signs have been reviewed:   Vitals:    02/18/24 1108   BP:  136/72   Pulse: 72   Temp:

## 2024-02-18 NOTE — Unmapped (Signed)
 To Whom This Concerns:    I take care of Tammy Braun for a long time. For the last few years, Her daughter- [name]- has brought her to the clinic repeatedly.     [name] is her primary caretaker. [name] has gotten sicker over the last few months, and [name] has to spend more time caring for [name].    Please excuse [name] from some of her school work and allow her to make up course work, given the amount of time she needs to care for her mother and bring her to clinic visits.      Thank you.      Lestine Mount, MD  Professor, [facility]

## 2024-02-18 NOTE — Unmapped (Signed)
 [facility] Specialty and Home Delivery Pharmacy Clinic Administered Medication Refill Coordination Note      NAME:Elliana Lounette Sloan DOB: 11/28/1941      Medication: fluphenazine decanoate    Day Supply:  30 days       SHIPPING      Next delivery from [facility] Home Delivery Pharmacy [phone]) to Adult Psychiatry Clinic for Afomia Blackley is scheduled for 02/26.    Clinic contact: [name]     Patient's next nurse visit for administration: 03/12.    We will follow up with clinic monthly for standard refill processing and delivery.      Camren Lipsett Samella Parr  [facility] Specialty and [facility]

## 2024-02-24 NOTE — Unmapped (Signed)
 Patient Advice Request Patient Name: Tammy Braun  Caller: Home Health  Name of Caller: Sonia  Contact Method: Telephone Call: Time- Any Time [phone]  Reason for Call: Tammy Braun is requesting the status of the orders placed for the patient. Homehealth plan of care and also the Physicians orders to schedule more visits  Previously Discussed: no  Appointment Offered: Declined  If offered accepted, scheduled appt date:

## 2024-02-25 DIAGNOSIS — I639 Cerebral infarction, unspecified: Principal | ICD-10-CM

## 2024-02-25 DIAGNOSIS — E1149 Type 2 diabetes mellitus with other diabetic neurological complication: Principal | ICD-10-CM

## 2024-02-25 MED FILL — FLUPHENAZINE DECANOATE 25 MG/ML INJECTION SOLUTION: INTRAMUSCULAR | 30 days supply | Qty: 5 | Fill #3

## 2024-02-25 NOTE — Unmapped (Cosign Needed)
 [facility] FAMILY MEDICINE [place] POPULATION HEALTH  Care Management Progress Note    Date: 02/25/2024  Outcome:  Phone contact completed    Purpose of contact:         Misty Stanley at [facility] - [phone] to clarify what is needed for home health.     Stated she had faxed some orders in February but only received some back.   CM reviewed chart to determine what is missing.    Order # that were returned on 2/18:    [id number]  [id number]    Order # not returned:    [id number]  [id number]    Will resend orders to provider/place in box.    Additional Information/Plan:  Patient provided my direct contact information and encouraged to contact me should additional needs arise.    Time Spent Per Day:  Chart review was completed prior to outreach attempt.   02/25/2024: 10      Lavella Hammock, MSW  [facility] FAMILY MEDICINE [place]

## 2024-02-25 NOTE — Unmapped (Signed)
 Form Processing Patient Name: Tammy Braun   Form Left By:  [facility]   How was the Form Received: Faxed  Type of Form:  Addendum request for office notes  Completed Form Delivery: Faxed- [phone]  Last seen in-person: 02/18/2024  Last telemedicine visit: Visit date not found  Date needed: ASAP     Provider form should go to:Lestine Mount   Scheduled Appt Date (if needed within 7 days): NA

## 2024-02-25 NOTE — Unmapped (Signed)
 Faxed all office notes from last 6 months again to  [facility] [phone]

## 2024-02-26 ENCOUNTER — Ambulatory Visit: Admit: 2024-02-26 | Payer: MEDICARE

## 2024-02-26 ENCOUNTER — Encounter: Admit: 2024-02-26 | Payer: MEDICARE

## 2024-02-26 ENCOUNTER — Encounter: Admit: 2024-02-26 | Payer: MEDICARE | Attending: Student in an Organized Health Care Education/Training Program

## 2024-02-26 ENCOUNTER — Encounter: Admit: 2024-02-26 | Payer: MEDICARE | Attending: Anesthesiology

## 2024-02-26 ENCOUNTER — Ambulatory Visit: Admit: 2024-02-26

## 2024-02-26 ENCOUNTER — Encounter: Admit: 2024-02-26

## 2024-02-26 ENCOUNTER — Ambulatory Visit: Admit: 2024-02-26 | Discharge: 2024-02-26 | Payer: MEDICARE | Attending: Adult Health | Primary: Adult Health

## 2024-02-26 ENCOUNTER — Inpatient Hospital Stay
Admit: 2024-02-26 | Discharge: 2024-04-14 | Disposition: A | Discharge status: Home Health | Payer: MEDICARE | Source: Ambulatory Visit | Admitting: Trauma Surgery

## 2024-02-26 ENCOUNTER — Encounter: Admit: 2024-02-26 | Payer: MEDICARE | Attending: Surgery

## 2024-02-26 ENCOUNTER — Ambulatory Visit: Admit: 2024-02-26 | Discharge: 2024-02-26 | Payer: MEDICARE

## 2024-02-26 DIAGNOSIS — R112 Nausea with vomiting, unspecified: Principal | ICD-10-CM

## 2024-02-26 LAB — COMPREHENSIVE METABOLIC PANEL
ALBUMIN: 4.5 g/dL (ref 3.4–5.0)
ALKALINE PHOSPHATASE: 101 U/L (ref 46–116)
ALT (SGPT): 20 U/L (ref 10–49)
ANION GAP: 16 mmol/L — ABNORMAL HIGH (ref 5–14)
AST (SGOT): 31 U/L (ref <=34)
BILIRUBIN TOTAL: 0.5 mg/dL (ref 0.3–1.2)
BLOOD UREA NITROGEN: 31 mg/dL — ABNORMAL HIGH (ref 9–23)
BUN / CREAT RATIO: 15
CALCIUM: 10.6 mg/dL — ABNORMAL HIGH (ref 8.7–10.4)
CHLORIDE: 90 mmol/L — ABNORMAL LOW (ref 98–107)
CO2: 28 mmol/L (ref 20.0–31.0)
CREATININE: 2.02 mg/dL — ABNORMAL HIGH (ref 0.55–1.02)
EGFR CKD-EPI (2021) FEMALE: 25 mL/min/{1.73_m2} — ABNORMAL LOW (ref >=60)
GLUCOSE RANDOM: 183 mg/dL — ABNORMAL HIGH (ref 70–179)
POTASSIUM: 4.7 mmol/L (ref 3.4–4.8)
PROTEIN TOTAL: 9.2 g/dL — ABNORMAL HIGH (ref 5.7–8.2)
SODIUM: 134 mmol/L — ABNORMAL LOW (ref 135–145)

## 2024-02-26 LAB — BLOOD GAS CRITICAL CARE PANEL, VENOUS
BASE EXCESS VENOUS: 5.5 — ABNORMAL HIGH (ref -2.0–2.0)
CALCIUM IONIZED VENOUS (MG/DL): 4.78 mg/dL (ref 4.40–5.40)
GLUCOSE WHOLE BLOOD: 179 mg/dL (ref 70–179)
HCO3 VENOUS: 33 mmol/L — ABNORMAL HIGH (ref 22–27)
HEMOGLOBIN BLOOD GAS: 16.6 g/dL — ABNORMAL HIGH (ref 12.00–16.00)
LACTATE BLOOD VENOUS: 3.1 mmol/L — ABNORMAL HIGH (ref 0.5–1.8)
O2 SATURATION VENOUS: 59 % (ref 40.0–85.0)
PCO2 VENOUS: 56 mmHg (ref 40–60)
PH VENOUS: 7.38 (ref 7.32–7.43)
PO2 VENOUS: 34 mmHg (ref 30–55)
POTASSIUM WHOLE BLOOD: 4.8 mmol/L — ABNORMAL HIGH (ref 3.4–4.6)
SODIUM WHOLE BLOOD: 141 mmol/L (ref 135–145)

## 2024-02-26 LAB — CBC W/ AUTO DIFF
BASOPHILS ABSOLUTE COUNT: 0 10*9/L (ref 0.0–0.1)
BASOPHILS RELATIVE PERCENT: 0.2 %
EOSINOPHILS ABSOLUTE COUNT: 0 10*9/L (ref 0.0–0.5)
EOSINOPHILS RELATIVE PERCENT: 0 %
HEMATOCRIT: 46.3 % — ABNORMAL HIGH (ref 34.0–44.0)
HEMOGLOBIN: 15.2 g/dL — ABNORMAL HIGH (ref 11.3–14.9)
LYMPHOCYTES ABSOLUTE COUNT: 0.4 10*9/L — ABNORMAL LOW (ref 1.1–3.6)
LYMPHOCYTES RELATIVE PERCENT: 2.4 %
MEAN CORPUSCULAR HEMOGLOBIN CONC: 32.9 g/dL (ref 32.0–36.0)
MEAN CORPUSCULAR HEMOGLOBIN: 28.1 pg (ref 25.9–32.4)
MEAN CORPUSCULAR VOLUME: 85.4 fL (ref 77.6–95.7)
MEAN PLATELET VOLUME: 7.8 fL (ref 6.8–10.7)
MONOCYTES ABSOLUTE COUNT: 0.9 10*9/L — ABNORMAL HIGH (ref 0.3–0.8)
MONOCYTES RELATIVE PERCENT: 5.1 %
NEUTROPHILS ABSOLUTE COUNT: 17.1 10*9/L — ABNORMAL HIGH (ref 1.8–7.8)
NEUTROPHILS RELATIVE PERCENT: 92.3 %
PLATELET COUNT: 314 10*9/L (ref 150–450)
RED BLOOD CELL COUNT: 5.42 10*12/L — ABNORMAL HIGH (ref 3.95–5.13)
RED CELL DISTRIBUTION WIDTH: 17.5 % — ABNORMAL HIGH (ref 12.2–15.2)
WBC ADJUSTED: 18.5 10*9/L — ABNORMAL HIGH (ref 3.6–11.2)

## 2024-02-26 LAB — HIGH SENSITIVITY TROPONIN I - SERIAL: HIGH SENSITIVITY TROPONIN I: 20 ng/L (ref <=34)

## 2024-02-26 LAB — URINALYSIS WITH MICROSCOPY WITH CULTURE REFLEX PERFORMABLE
BILIRUBIN UA: NEGATIVE
GLUCOSE UA: NEGATIVE
KETONES UA: NEGATIVE
NITRITE UA: NEGATIVE
PH UA: 7.5 (ref 5.0–9.0)
PROTEIN UA: 300 — AB
RBC UA: 42 /HPF — ABNORMAL HIGH (ref <=4)
SPECIFIC GRAVITY UA: 1.015 (ref 1.003–1.030)
SQUAMOUS EPITHELIAL: 3 /HPF (ref 0–5)
UROBILINOGEN UA: 4 — AB
WBC UA: 71 /HPF — ABNORMAL HIGH (ref 0–5)

## 2024-02-26 LAB — HIGH SENSITIVITY TROPONIN I - 2 HOUR SERIAL
HIGH SENSITIVITY TROPONIN - DELTA (0-2H): 1 ng/L (ref <=7)
HIGH-SENSITIVITY TROPONIN I - 2 HOUR: 19 ng/L (ref <=34)

## 2024-02-26 LAB — MAGNESIUM: MAGNESIUM: 3.2 mg/dL — ABNORMAL HIGH (ref 1.6–2.6)

## 2024-02-26 LAB — LIPASE: LIPASE: 21 U/L (ref 12–53)

## 2024-02-26 MED ADMIN — acetaminophen (TYLENOL) tablet 1,000 mg: 1000 mg | ORAL | Stop: 2024-02-26

## 2024-02-26 NOTE — Unmapped (Signed)
 [facility]  Emergency Department Provider Note       ED Clinical Impression     Final diagnoses:   Nausea and vomiting, unspecified vomiting type (Primary)   Right upper quadrant abdominal pain        HPI, MDM, ED Course   Chief Complaint  Chief Complaint   Patient presents with    Emesis       HPI   Tammy Braun is a 83 y.o. female previous history of remote bladder cancer status post radical cystectomy with Ileal Conduit , COPD (no reported oxygen requirement), T2DM, hypertension, previous CVA who presents to the ED for the chief complaint of acute abdominal pain and emesis.    Per chart review, patient presented to the urgent care this morning for the above symptoms.  She was referred to the ED due to the severity of the emesis.  Received 4 mg sublingual Zofran with improvement in symptoms.    Patient presents with her daughter at bedside.  Endorses acute onset right upper quadrant abdominal pain along with profuse nausea and vomiting.  She states she woke up feeling her baseline self.  Patient lives primarily with her daughter.  Denies any recent falls or injuries.  Patient's son is also sick with similar symptoms.  Patient normally ambulates with a cane.  On further ROS, pt denies any fevers, headaches, vision changes, ataxia, diarrhea, chest pain, acute dyspnea, dysuria, hematuria, acute peripheral edema, new rashes, recent travel.  No reported melena or hematochezia.  Endorses normal ileal conduit output.  Regarding her other surgical history, she endorses a previous cholecystectomy. Denies any active etoh or substance use.    MDM: Patient's chart reviewed where applicable. Presenting vitals notable for afebrile, heart rate 80, systolic blood pressure 172, 96% on EMS 2 L nasal cannula though oxygen 95% on room air on my evaluation. Pt presentation most consistent with acute right upper quadrant abdominal pain and multiple episodes of emesis in the 83 year old female with history of bladder cancer status post radical cystectomy, COPD and T2DM. Differentials include but not limited to viral GI infection, AKI, UTI, pyleo, choledocholithiasis, dehydration, atypical ACS, Boerhaave's, DKA, ICH, ischemic bowel, meningitis, sepsis, acute radiation syndrome, adrenal insufficiency, appendicitis, SBO, ileus, metabolic derangement, elevated ICP, gastric outlet syndrome, acetaminophen toxicity, aspirin toxicity, digoxin toxicity, pancreatitis, peritonitis, ruptured viscus, testicular/ovarian torsion.  Physical exam reassuring as below.  Given her age abdominal pain, will require broad infectious workup including CBC, CMP, lipase, urinalysis.  Will also obtain chest x-ray given coarse breath sounds to rule out significant aspiration pneumonitis, pneumonia, free air.  Will also obtain CT A/P given her abdominal pain and prior surgical history.  No head strike or concerning malignancy symptoms to suggest need to acquire CT head. Will treat with 1 L fluid bolus and Zofran. Workup and orders as below. Pt disposition: Patient signed out to oncoming provider pending UA and CT A/P. At time of sign out, patient remains in stable condition.     Plan:   Orders Placed This Encounter   Procedures    XR Chest 2 views    CT abdomen pelvis without contrast    CBC w/ Differential    Comprehensive Metabolic Panel    Lipase    Magnesium    Urinalysis with Microscopy with Culture Reflex    hsTroponin I (serial 0-2-6H w/ delta)    Blood Gas Critical Care Panel, Venous    hsTroponin I - 2 Hour    ECG 12 Lead  ED Course as of 02/26/24 2116   Wed Feb 26, 2024   1606 ECG 12 Lead  On my personal interpretation: Rate: 87 Rhythm: NSR, occasional PVC, Axis: RAD, Intervals wnl, No STE/STD.   1828 XR Chest 2 views  Large hiatal hernia and associated right basilar lung atelectasis. Superimposed infectious process or aspiration cannot be definitively excluded.   1933 Blood Gas Critical Care Panel, Venous(!):    Specimen Source Venous   FIO2 Venous 2L   pH, Venous 7.38   pCO2, Ven 56   pO2, Ven 34   HCO3, Ven 33(!)   Base Excess, Ven 5.5(!)   O2 Saturation, Venous 59.0   Sodium Whole Blood 141   Potassium, Bld 4.8(!)   Ionized Calcium 4.78   Glucose Whole Blood 179   Lactate, Venous 3.1(!)   Hemoglobin 16.60(!)  pH 7.38, pCO2 56, pO2 34, bicarb 33.   2004 Comprehensive Metabolic Panel(!):    Sodium 134(!)   Potassium 4.7   Chloride 90(!)   CO2 28.0   Anion Gap 16(!)   Bun 31(!)   Creatinine 2.02(!)   BUN/Creatinine Ratio 15   eGFR CKD-EPI (2021) Female 25(!)   Glucose 183(!)   Calcium 10.6(!)   Albumin 4.5   Total Protein 9.2(!)   Total Bilirubin 0.5   SGOT (AST) 31   ALT 20   Alkaline Phosphatase 101  Na 134, K wnl, AKI with creatinine 2.02, anion gap 16, BG 183, hepatic enzymes within normal limits.   2005 hsTroponin I: 20   2005 hsTroponin I: 19  Delta 1.   2005 Lipase: 21   2005 CBC w/ Differential(!):    WBC 18.5(!)   RBC 5.42(!)   HGB 15.2(!)   HCT 46.3(!)   MCV 85.4   MCH 28.1   MCHC 32.9   RDW 17.5(!)   MPV 7.8   Platelet 314   Neutrophils % 92.3   Lymphocytes % 2.4   Monocytes % 5.1   Eosinophils % 0.0   Basophils % 0.2   Absolute Neutrophils 17.1(!)   Absolute Lymphocytes 0.4(!)   Absolute Monocytes  0.9(!)   Absolute Eosinophils 0.0   Absolute Basophils  0.0   Anisocytosis Slight(!)  Leukocytosis with left shift, WBC 18, no anemia or thrombocytopenia.   2028 CT abdomen pelvis without contrast  Switched to non-con 2/2 poor GFR.       ____________________________________________    The case was discussed with the attending physician who is in agreement with the above assessment and plan    Additional Medical Decision Making     I have reviewed the vital signs and the nursing notes. Labs and radiology results that were available during my care of the patient were independently reviewed by me and considered in my medical decision making.     I independently visualized the EKG tracing if performed  I independently visualized the radiology images if performed  I reviewed the patient's prior medical records if available.  Additional history obtained from family if available  I discussed the case with the admitting provider and the consulting services if the patient was admitted and/or consulting services were utilized.     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  Chronic kidney disease     Depression, psychotic (CMS-HCC)     Diabetes mellitus (CMS-HCC)     in past    Emphysema of lung (CMS-HCC)     Financial difficulties     Frail elderly     Hearing impairment     Hernia     History of transfusion     Hyperlipidemia     Hypertension     Impaired mobility     Osteoporosis     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/08/2015    Visual impairment        Past Surgical History:   Procedure Laterality Date    ABDOMINAL SURGERY      BLADDER SURGERY      BREAST CYST EXCISION      CHEMOTHERAPY  2012    bladder    GALLBLADDER SURGERY      stone removal    ILEOSTOMY  2012    PR COLONOSCOPY FLX DX W/COLLJ SPEC WHEN PFRMD N/A 02/09/2015    Procedure: COLONOSCOPY, FLEXIBLE, PROXIMAL TO SPLENIC FLEXURE; DIAGNOSTIC, W/WO COLLECTION SPECIMEN BY BRUSH OR WASH;  Surgeon: Dewaine Conger, MD;  Location: [facility] [place];  Service: Gastroenterology    PR RECONSTR TOTAL SHOULDER IMPLANT Left 04/08/2018    Procedure: ARTHROPLASTY, GLENOHUMERAL JOINT; TOTAL SHOULDER(GLENOID & PROXIMAL HUMERAL REPLACEMENT(EG, TOTAL SHOULDER);  Surgeon: Tomasa Rand, MD;  Location: [facility];  Service: Ortho Sports Medicine    PR SIGMOIDOSCOPY,BIOPSY N/A 03/11/2015    Procedure: SIGMOIDOSCOPY, FLEXIBLE; WITH BIOPSY, SINGLE OR MULTIPLE;  Surgeon: Wilburt Finlay, MD;  [facility];  Service: Gastroenterology    PR XCAPSL CTRC RMVL INSJ IO LENS PROSTH W/O ECP Left 06/05/2022    Procedure: EXTRACAPSULAR CATARACT REMOVAL W/INSERTION OF INTRAOCULAR LENS PROSTHESIS, MANUAL OR MECHANICAL TECHNIQUE WITHOUT ENDOSCOPIC CYCLOPHOTOCOAGULATION;  Surgeon: Garner Gavel, MD;  Location: [facility];  Service: Ophthalmology    PR XCAPSL CTRC RMVL INSJ IO LENS PROSTH W/O ECP Right 06/19/2022    Procedure: EXTRACAPSULAR CATARACT REMOVAL W/INSERTION OF INTRAOCULAR LENS PROSTHESIS, MANUAL OR MECHANICAL TECHNIQUE WITHOUT ENDOSCOPIC CYCLOPHOTOCOAGULATION;  Surgeon: Garner Gavel, MD;  Location: [facility] OR [facility];  Service: Ophthalmology         Current Facility-Administered Medications:     fluPHENAZine decanoate (PROLIXIN) injection 50 mg, 50 mg, Intramuscular, Q30 Days, Coralyn Helling, MD, 50 mg at 02/10/24 1106    Current Outpatient Medications:     albuterol HFA 90 mcg/actuation inhaler, Inhale 2 puffs every four (4) hours as needed for wheezing., Disp: 18 g, Rfl: 11    aspirin (ECOTRIN) 81 MG tablet, Take 1 tablet (81 mg total) by mouth daily., Disp: 30 tablet, Rfl: 11    atorvastatin (LIPITOR) 40 MG tablet, Take 1 tablet (40 mg total) by mouth daily., Disp: 90 tablet, Rfl: 3    blood sugar diagnostic (GLUCOSE BLOOD) Strp, Disp test strips preferred by insurance plan. Testing qday, Dx: E11.9 (Type 2 DM- controlled), Disp: 30 strip, Rfl: 11    blood-glucose meter kit, Disp. blood glucose meter kit preferred by patient's insurance. Check blood sugars as directed by provider. Dx: Diabetes, E11.9, Disp: 1 each, Rfl: 1    candesartan (ATACAND) 16 MG tablet, 1 po bid, Disp: 60 tablet, Rfl: 11    cyclobenzaprine (FLEXERIL) 5 MG tablet, Take 1 tablet (5 mg total) by mouth two (2) times a day as needed for muscle spasms., Disp: 60 tablet, Rfl: 5    dilTIAZem (CARDIZEM CD)  120 MG 24 hr capsule, Take 1 capsule (120 mg total) by mouth daily., Disp: 30 capsule, Rfl: 11    DULoxetine (CYMBALTA) 30 MG capsule, Take 1 capsule (30 mg total) by mouth daily., Disp: 30 capsule, Rfl: 11    fluPHENAZine decanoate (PROLIXIN) 25 mg/mL injection, Inject 2 mL (50 mg total) into the muscle every thirty (30) days., Disp: 5 mL, Rfl: 11    furosemide (LASIX) 20 MG tablet, TAKE 2 TABLETS BY MOUTH DAILY FOR 5 DAYS, THEN 1 TABLET DAILY, Disp: 90 tablet, Rfl: 1    gabapentin (NEURONTIN) 100 MG capsule, Take 1 capsule (100 mg total) by mouth Three (3) times a day., Disp: 90 capsule, Rfl: 2    glipiZIDE 2.5 mg Tab, 1 po q day, Disp: 30 tablet, Rfl: 5    inhalational spacing device (E-Z SPACER) Spcr, 1 each by Miscellaneous route every four (4) hours as needed., Disp: 1 each, Rfl: 0    lancets Misc, Disp. lancets #30 or amount allowed, Testing once Qday. Dx: E11.9 (Diabetes- controlled), Disp: 30 each, Rfl: 11    MEDICAL SUPPLY ITEM, Entreal formula nutritionally supplement complete caloric density, Disp: 3 application, Rfl: 3    MEDICAL SUPPLY ITEM, Compression stockings knee high, Disp: 2 each, Rfl: 0    polyethylene glycol (MIRALAX) 17 gram packet, Take 17 g by mouth daily., Disp: 90 packet, Rfl: 3    senna (SENOKOT) 8.6 mg tablet, Take 2 tablets by mouth nightly as needed for constipation., Disp: 60 tablet, Rfl: 0    tiotropium (SPIRIVA WITH HANDIHALER) 18 mcg inhalation capsule, Place 1 capsule (18 mcg total) into inhaler and inhale daily as needed., Disp: 31 capsule, Rfl: 5    traMADol (ULTRAM) 50 mg tablet, 1 po at bedtime prn pain, Disp: 30 tablet, Rfl: 5    trolamine salicylate (ASPERCREME TOP), Apply 1 Application topically as needed (pain)., Disp: , Rfl:     Allergies  Lisinopril, Losartan, and Hctz [hydrochlorothiazide]    Family History   Problem Relation Age of Onset    Breast cancer Daughter 101    Diabetes Mother     Glaucoma Father     Colon cancer Neg Hx     Ovarian cancer Neg Hx     Endometrial cancer Neg Hx     Anesthesia problems Neg Hx     Bleeding Disorder Neg Hx        Social History  Social History     Tobacco Use    Smoking status: Every Day     Current packs/day: 1.00     Average packs/day: 1 pack/day for 65.9 years (65.9 ttl pk-yrs)     Types: Cigarettes     Start date: 04/09/1958    Smokeless tobacco: Never    Tobacco comments:     15cpd   Vaping Use    Vaping status: Never Used   Substance Use Topics    Alcohol use: No     Alcohol/week: 0.0 standard drinks of alcohol    Drug use: No        Physical Exam     VITAL SIGNS:      Vitals:    02/26/24 1429 02/26/24 1448 02/26/24 1931   BP: 172/80 153/93 149/102   Pulse: 80     Resp: 17 22    Temp: 36.9 ??C (98.5 ??F) 36.7 ??C (98 ??F)    TempSrc:  Oral    SpO2: 96% 95% (!) 85%       Constitutional: Alert  and oriented.  Nontoxic, several episodes of witnessed emesis at bedside.  Eyes: Conjunctivae are normal.  Mouth/Throat: Mucous membranes are moist. No oropharyngeal exudate or erythema.  Cardiovascular: Normal rate, regular rhythm. No murmurs, rubs, or gallops appreciated.  Respiratory: Normal respiratory effort. Breath sounds are normal. No adventitious breath sounds.  Gastrointestinal: Soft, mild tenderness palpation of the right upper quadrant.  Ileal conduit stoma site appears clean dry and intact.  Normal-appearing urine output.  No bleeding.  No rebound or guarding.   Genitourinary: No suprapubic tenderness.   Musculoskeletal: Normal range of motion in all extremities. No obvious deformity in any extremity.  Neurologic: Appearance without facial droop, language without expressive or receptive aphasia. Moves all extremities equally. No gross focal neurologic deficits are appreciated.   Skin: Skin is warm, dry and intact. No rash noted. No obvious skin breakdown.      Radiology     XR Chest 2 views   Final Result   Large hiatal hernia and associated right basilar lung atelectasis. Superimposed infectious process or aspiration cannot be definitively excluded.         CT abdomen pelvis without contrast    (Results Pending)        Laboratory Data     Lab Results   Component Value Date    WBC 18.5 (H) 02/26/2024    HGB 15.2 (H) 02/26/2024    HCT 46.3 (H) 02/26/2024    PLT 314 02/26/2024       Lab Results   Component Value Date    NA 134 (L) 02/26/2024    NA 141 02/26/2024    K 4.7 02/26/2024    K 4.8 (H) 02/26/2024    CL 90 (L) 02/26/2024    CO2 28.0 02/26/2024    BUN 31 (H) 02/26/2024    CREATININE 2.02 (H) 02/26/2024    GLU 183 (H) 02/26/2024    CALCIUM 10.6 (H) 02/26/2024    MG 3.2 (H) 02/26/2024    PHOS 3.2 05/13/2022       Lab Results   Component Value Date    BILITOT 0.5 02/26/2024    PROT 9.2 (H) 02/26/2024    ALBUMIN 4.5 02/26/2024    ALT 20 02/26/2024    AST 31 02/26/2024    ALKPHOS 101 02/26/2024    GGT 16 09/17/2011       Lab Results   Component Value Date    LABPROT 11.8 08/02/2013    INR 1.1 08/02/2013    APTT 28.0 08/02/2013       Pertinent labs & imaging results that were available during my care of the patient were reviewed by me and considered in my medical decision making (see chart for details).    Portions of this record have been created using Scientist, clinical (histocompatibility and immunogenetics). Dictation errors have been sought, but may not have been identified and corrected.    Leighton Parody, M.D.  Department of Emergency Medicine, PGY-2     Candis Schatz, MD  Resident  02/26/24 [phone]

## 2024-02-26 NOTE — Unmapped (Signed)
 Bed: HALL-01B  Expected date:   Expected time:   Means of arrival:   Comments:

## 2024-02-26 NOTE — Unmapped (Signed)
 [facility] CLINIC NOTE    ASSESSMENT/PLAN:      Projectile vomiting  Abdominal Pain    Due to the patient being 83 years old, having projectile vomiting and abdominal pain she is advised to go to the emergency department    Her daughter was going to take her to the emergency department but her car would not start.  Therefore EMS was called to take this frail 83 year old to the ED.    Problem List Items Addressed This Visit    None  Visit Diagnoses         Nausea and vomiting, unspecified vomiting type    -  Primary            Chief Complaint   Patient presents with    Emesis     -was seen at a 11am specialist appt and daugther said she started throwing up        SUBJECTIVE:    Ms. Mahadeo is a 83 y.o. female that presents to Urgent Care  today regarding the following issues:    #  Projectile vomiting/abdominal pain     This 83 year old female with multiple problems comes in with her daughter with projectile vomiting and severe abdominal pain.  She was on her way to a specialist this morning did not see the specialist they advised she come to the urgent care.  The patient is complaining of diffuse abdominal pain.    I have reviewed the patients problem list, medical history, surgical history, laboratory history and recent hospitalizations, current medications, allergies, and social history and updated them as needed.    Review of symptoms:  Negative unless  Otherwise stated in HPI.     Ms. Mcmanaway  reports that she has been smoking cigarettes. She started smoking about 65 years ago. She has a 65.9 pack-year smoking history. She has never used smokeless tobacco.    OBJECTIVE:    VITALS:   Vitals:    02/26/24 1301   BP: 104/84   Pulse: 93   Temp: 36.2 ??C (97.2 ??F)   SpO2: 92%    Wt:   Wt Readings from Last 3 Encounters:   02/26/24 61.2 kg (135 lb)   02/18/24 61.2 kg (135 lb)   02/10/24 62.7 kg (138 lb 3.2 oz)       Physical Exam       Gen: Pleasant and cooperative in NAD, resting comfortably, appears stated age  Head: Normocephalic, atraumatic  CV: Normal S1 S2 no m/r/g, HR is 93    Resp: Clear to ascultation bilaterally without adventitious sounds  GI: Diffuse tenderness.   Neuro:  A&O x 4, CN II-XII grossly intact, normal gait  Skin: Warm and dry, no obvious rash or suspicious lesions present  Psych:  Mood is good, able to carry on normal conversation, with good eye contact, without evidence of anxiety or depression    LABS:  No results found for any visits on 02/26/24.    STUDIES:  No results found.    ___________________________________  CURRENT MEDS:  Current Outpatient Medications   Medication Sig Dispense Refill    albuterol HFA 90 mcg/actuation inhaler Inhale 2 puffs every four (4) hours as needed for wheezing. 18 g 11    aspirin (ECOTRIN) 81 MG tablet Take 1 tablet (81 mg total) by mouth daily. 30 tablet 11    atorvastatin (LIPITOR) 40 MG tablet Take 1 tablet (40 mg total) by mouth daily. 90 tablet  3    blood sugar diagnostic (GLUCOSE BLOOD) Strp Disp test strips preferred by insurance plan. Testing qday, Dx: E11.9 (Type 2 DM- controlled) 30 strip 11    blood-glucose meter kit Disp. blood glucose meter kit preferred by patient's insurance. Check blood sugars as directed by provider. Dx: Diabetes, E11.9 1 each 1    candesartan (ATACAND) 16 MG tablet 1 po bid 60 tablet 11    cyclobenzaprine (FLEXERIL) 5 MG tablet Take 1 tablet (5 mg total) by mouth two (2) times a day as needed for muscle spasms. 60 tablet 5    dilTIAZem (CARDIZEM CD) 120 MG 24 hr capsule Take 1 capsule (120 mg total) by mouth daily. 30 capsule 11    DULoxetine (CYMBALTA) 30 MG capsule Take 1 capsule (30 mg total) by mouth daily. 30 capsule 11    fluPHENAZine decanoate (PROLIXIN) 25 mg/mL injection Inject 2 mL (50 mg total) into the muscle every thirty (30) days. 5 mL 11    furosemide (LASIX) 20 MG tablet TAKE 2 TABLETS BY MOUTH DAILY FOR 5 DAYS, THEN 1 TABLET DAILY 90 tablet 1    gabapentin (NEURONTIN) 100 MG capsule Take 1 capsule (100 mg total) by mouth Three (3) times a day. 90 capsule 2    glipiZIDE 2.5 mg Tab 1 po q day 30 tablet 5    inhalational spacing device (E-Z SPACER) Spcr 1 each by Miscellaneous route every four (4) hours as needed. 1 each 0    lancets Misc Disp. lancets #30 or amount allowed, Testing once Qday. Dx: E11.9 (Diabetes- controlled) 30 each 11    MEDICAL SUPPLY ITEM Entreal formula nutritionally supplement complete caloric density 3 application 3    MEDICAL SUPPLY ITEM Compression stockings knee high 2 each 0    polyethylene glycol (MIRALAX) 17 gram packet Take 17 g by mouth daily. 90 packet 3    senna (SENOKOT) 8.6 mg tablet Take 2 tablets by mouth nightly as needed for constipation. 60 tablet 0    tiotropium (SPIRIVA WITH HANDIHALER) 18 mcg inhalation capsule Place 1 capsule (18 mcg total) into inhaler and inhale daily as needed. 31 capsule 5    traMADol (ULTRAM) 50 mg tablet 1 po at bedtime prn pain 30 tablet 5    trolamine salicylate (ASPERCREME TOP) Apply 1 Application topically as needed (pain).       Current Facility-Administered Medications   Medication Dose Route Frequency Provider Last Rate Last Admin    fluPHENAZine decanoate (PROLIXIN) injection 50 mg  50 mg Intramuscular Q30 Days Coralyn Helling, MD   50 mg at 02/10/24 1106       ___________________________________  ALLERGIES:  Allergies   Allergen Reactions    Lisinopril Anaphylaxis and Swelling    Losartan Dizziness    Hctz [Hydrochlorothiazide]      SIADH     ------------------------    PAST MEDICAL HISTORY:   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  no    Transferred to ED Patient Disposition    Indication for Disposition: Requires a higher level of care    Chief Complaint:  vomiting in an 83 yo with severe abdominal pain       Allegra Lai given instructions to proceed to send can she get me your phone numberED via personal vehicle; Location: [facility] due to Requires a higher level of care, testing, and/or treatment not able to be provided at Urgent Care             Is some I seem to be just popped his head out just female    [facility]  [facility]  [address], [place], [place] [address]  Telephone [phone]  Fax [phone]  [email or web address]

## 2024-02-26 NOTE — Unmapped (Signed)
 Pt Bib EMS from UC with N/V. Multiple projectile vomiting at UC. 4mg  SL zofran with minor improvement. Hx of COPD on [facility] now with EMS. Hx of dementia.

## 2024-02-26 NOTE — Unmapped (Signed)
 Acute Care Surgery History & Physical  Note    Requesting Attending Physician:  Jocelyn Lamer, MD  Service Requesting Consult:  Emergency Medicine    Assessment:  Tammy Braun is a 83 y.o. female with history of bladder cancer s/p cystectomy, ileal conduit who presented to the ED with one day of nausea, emesis, abdominal distention. CT A/P showing SBO with transition point at ileal conduit. In the ED she was AF, HDN, on room air. Labs with leukocytosis iso hemoconcentration, AKI on CKD, mildly elevated venous lactate improved with resuscitation, UTI s/p CTX x1.    MH: T2DM, HTN, CVA, COPD (not on home oxygen), current 0.75 ppd smoker 66 pack year hx, schizoaffective disorder on injectable fluphenazine, CKD3a, UTI, PVCs, large hiatal hernia    Plan:  -Admit to [facility], floor status  -hold home lasix, diltiazem, fluphenazine IM monthly (unsure last dose), gabapentin  -prn IV apap, IV hm 0.2 mg, home prn flexeril  -home asa, statin   -home LAMA, prn albuterol, IS  -NPO, NGT LIWS  -LR@100   -SQH  -CTX x5 days for UTI, trend urine culture  -SSI  -Consider urology consultation if AKI does not improve/UOP inadequate iso parastomal hernia, mild bilateral hydroureteronephrosis  -PT/OT/OOB    If you have any questions, concerns or changes in the patient's clinical status, please feel free to contact [facility] floor pager.     Plan was discussed with attending surgeon, Westley Foots, MD,  who is in agreement.     History of Present Illness:   Tammy Braun is a 83 y.o. female who is seen in consultation for SBO at the request of Jocelyn Lamer, MD on the Emergency Medicine service.     Presents with one days of nausea, emesis, abdominal distention  Took milk of magnesia at home, did not help  Not passing flatus  Last BM yesterday  Endorses mild abdominal pain    In the ED, NGT placed with 1.5L gastric output  1L NS in ED  Found to have AKI on CKD, UTI    Confirmed full code on admission  Would want surgical intervention if indicated and necessary  No previous admissions for SBO.     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    Chronic kidney disease     Depression, psychotic (CMS-HCC)     Diabetes mellitus (CMS-HCC)     in past    Emphysema of lung (CMS-HCC)     Financial difficulties     Frail elderly     Hearing impairment     Hernia     History of transfusion     Hyperlipidemia     Hypertension     Impaired mobility     Osteoporosis     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/08/2015    Visual impairment        Surgical History:  Past Surgical History:   Procedure Laterality Date    ABDOMINAL SURGERY      BLADDER SURGERY      BREAST CYST EXCISION      CHEMOTHERAPY  2012    bladder    GALLBLADDER SURGERY  stone removal    ILEOSTOMY  2012    PR COLONOSCOPY FLX DX W/COLLJ SPEC WHEN PFRMD N/A 02/09/2015    Procedure: COLONOSCOPY, FLEXIBLE, PROXIMAL TO SPLENIC FLEXURE; DIAGNOSTIC, W/WO COLLECTION SPECIMEN BY BRUSH OR WASH;  Surgeon: Dewaine Conger, MD;  Location: [facility] [place];  Service: Gastroenterology    PR RECONSTR TOTAL SHOULDER IMPLANT Left 04/08/2018    Procedure: ARTHROPLASTY, GLENOHUMERAL JOINT; TOTAL SHOULDER(GLENOID & PROXIMAL HUMERAL REPLACEMENT(EG, TOTAL SHOULDER);  Surgeon: Tomasa Rand, MD;  Location: [facility];  Service: Ortho Sports Medicine    PR SIGMOIDOSCOPY,BIOPSY N/A 03/11/2015    Procedure: SIGMOIDOSCOPY, FLEXIBLE; WITH BIOPSY, SINGLE OR MULTIPLE;  Surgeon: Wilburt Finlay, MD;  [facility]: [facility];  Service: Gastroenterology    PR XCAPSL CTRC RMVL INSJ IO LENS PROSTH W/O ECP Left 06/05/2022    Procedure: EXTRACAPSULAR CATARACT REMOVAL W/INSERTION OF INTRAOCULAR LENS PROSTHESIS, MANUAL OR MECHANICAL TECHNIQUE WITHOUT ENDOSCOPIC CYCLOPHOTOCOAGULATION;  Surgeon: Garner Gavel, MD;  Location: [facility] OR [facility];  Service: Ophthalmology    PR XCAPSL CTRC RMVL INSJ IO LENS PROSTH W/O ECP Right 06/19/2022    Procedure: EXTRACAPSULAR CATARACT REMOVAL W/INSERTION OF INTRAOCULAR LENS PROSTHESIS, MANUAL OR MECHANICAL TECHNIQUE WITHOUT ENDOSCOPIC CYCLOPHOTOCOAGULATION;  Surgeon: Garner Gavel, MD;  Location: [facility] OR [facility];  Service: Ophthalmology       Medications:  Current Facility-Administered Medications on File Prior to Encounter   Medication Dose Route Frequency Provider Last Rate Last Admin    fluPHENAZine decanoate (PROLIXIN) injection 50 mg  50 mg Intramuscular Q30 Days Coralyn Helling, MD   50 mg at 02/10/24 1106     Current Outpatient Medications on File Prior to Encounter   Medication Sig Dispense Refill    albuterol HFA 90 mcg/actuation inhaler Inhale 2 puffs every four (4) hours as needed for wheezing. 18 g 11    aspirin (ECOTRIN) 81 MG tablet Take 1 tablet (81 mg total) by mouth daily. 30 tablet 11    atorvastatin (LIPITOR) 40 MG tablet Take 1 tablet (40 mg total) by mouth daily. 90 tablet 3    blood sugar diagnostic (GLUCOSE BLOOD) Strp Disp test strips preferred by insurance plan. Testing qday, Dx: E11.9 (Type 2 DM- controlled) 30 strip 11    blood-glucose meter kit Disp. blood glucose meter kit preferred by patient's insurance. Check blood sugars as directed by provider. Dx: Diabetes, E11.9 1 each 1    candesartan (ATACAND) 16 MG tablet 1 po bid 60 tablet 11    cyclobenzaprine (FLEXERIL) 5 MG tablet Take 1 tablet (5 mg total) by mouth two (2) times a day as needed for muscle spasms. 60 tablet 5    dilTIAZem (CARDIZEM CD) 120 MG 24 hr capsule Take 1 capsule (120 mg total) by mouth daily. 30 capsule 11    DULoxetine (CYMBALTA) 30 MG capsule Take 1 capsule (30 mg total) by mouth daily. 30 capsule 11    fluPHENAZine decanoate (PROLIXIN) 25 mg/mL injection Inject 2 mL (50 mg total) into the muscle every thirty (30) days. 5 mL 11    furosemide (LASIX) 20 MG tablet TAKE 2 TABLETS BY MOUTH DAILY FOR 5 DAYS, THEN 1 TABLET DAILY 90 tablet 1    gabapentin (NEURONTIN) 100 MG capsule Take 1 capsule (100 mg total) by mouth Three (3) times a day. 90 capsule 2    glipiZIDE 2.5 mg Tab 1 po q day 30 tablet 5    inhalational spacing device (E-Z SPACER) Spcr 1 each by  Miscellaneous route every four (4) hours as needed. 1 each 0    lancets Misc Disp. lancets #30 or amount allowed, Testing once Qday. Dx: E11.9 (Diabetes- controlled) 30 each 11    MEDICAL SUPPLY ITEM Entreal formula nutritionally supplement complete caloric density 3 application 3    MEDICAL SUPPLY ITEM Compression stockings knee high 2 each 0    polyethylene glycol (MIRALAX) 17 gram packet Take 17 g by mouth daily. 90 packet 3    senna (SENOKOT) 8.6 mg tablet Take 2 tablets by mouth nightly as needed for constipation. 60 tablet 0    tiotropium (SPIRIVA WITH HANDIHALER) 18 mcg inhalation capsule Place 1 capsule (18 mcg total) into inhaler and inhale daily as needed. 31 capsule 5    traMADol (ULTRAM) 50 mg tablet 1 po at bedtime prn pain 30 tablet 5    trolamine salicylate (ASPERCREME TOP) Apply 1 Application topically as needed (pain).         Allergies:  Allergies   Allergen Reactions    Lisinopril Anaphylaxis and Swelling    Losartan Dizziness    Hctz [Hydrochlorothiazide]      SIADH       Family History:  Family History   Problem Relation Age of Onset    Breast cancer Daughter 74    Diabetes Mother     Glaucoma Father     Colon cancer Neg Hx     Ovarian cancer Neg Hx     Endometrial cancer Neg Hx     Anesthesia problems Neg Hx     Bleeding Disorder Neg Hx        Social History:   Social History     Tobacco Use    Smoking status: Every Day     Current packs/day: 1.00     Average packs/day: 1 pack/day for 65.9 years (65.9 ttl pk-yrs)     Types: Cigarettes     Start date: 04/09/1958    Smokeless tobacco: Never    Tobacco comments:     15cpd   Vaping Use    Vaping status: Never Used   Substance Use Topics    Alcohol use: No     Alcohol/week: 0.0 standard drinks of alcohol    Drug use: No       Review of Systems  10 systems were reviewed and are negative except as noted specifically in the HPI.    Objective  Vitals:   Temp:  [36.2 ??C (97.2 ??F)-36.9 ??C (98.5 ??F)] 36.7 ??C (98 ??F)  Heart Rate:  [80-93] 80  SpO2 Pulse:  [84-86] 86  Resp:  [17-22] 22  BP: (104-172)/(80-102) 149/102  MAP (mmHg):  [112-117] 117  SpO2:  [85 %-96 %] 85 %  BMI (Calculated):  [25.52] 25.52    Intake/Output last 24 hours:  No intake or output data in the 24 hours ending 02/26/24 2322    Physical Exam:   Gen: Resting comfortably in bed in no acute distress  Neuro: [id number], answers questions appropriately  Card: Normal rate  Pulm: Coarse breaths with expiratory wheezing bilaterally  Abdominal: firm, compressible, trace tenderness RUQ, distended, ileal conduit pink with yellow urine in bag  MSK: moves extremities spontaneously, nonpitting edema in BLE    Pertinent Diagnostic Tests:  Lab Results   Component Value Date    WBC 18.5 (H) 02/26/2024    HGB 15.2 (H) 02/26/2024    HCT 46.3 (H) 02/26/2024    PLT 314 02/26/2024       Lab Results  Component Value Date    NA 134 (L) 02/26/2024    NA 141 02/26/2024    K 4.7 02/26/2024    K 4.8 (H) 02/26/2024    CL 90 (L) 02/26/2024    CO2 28.0 02/26/2024    BUN 31 (H) 02/26/2024    CREATININE 2.02 (H) 02/26/2024    GLU 183 (H) 02/26/2024    CALCIUM 10.6 (H) 02/26/2024    MG 3.2 (H) 02/26/2024    PHOS 3.2 05/13/2022       Lab Results   Component Value Date    BILITOT 0.5 02/26/2024    PROT 9.2 (H) 02/26/2024    ALBUMIN 4.5 02/26/2024    ALT 20 02/26/2024    AST 31 02/26/2024    ALKPHOS 101 02/26/2024    GGT 16 09/17/2011       Lab Results   Component Value Date    PT 10.9 09/17/2011    INR 1.1 08/02/2013    APTT 28.0 08/02/2013        Lab Results   Component Value Date    LACTATE 3.1 (H) 02/26/2024        Imaging:  CT abdomen pelvis without contrast  Result Date: 02/26/2024  EXAM: CT ABDOMEN PELVIS WO CONTRAST ACCESSION: [id number] UN REPORT DATE: 02/26/2024 9:35 PM CLINICAL INDICATION: 83 years old with Acute RUQ abd pain, N/V, leukocytosis  COMPARISON: CT 05/10/2022 and prior; MRI 01/24/2024 TECHNIQUE: A spiral CT scan was obtained without IV contrast from the lung bases to the pubic symphysis.  Images were reconstructed in the axial plane. Coronal and sagittal reformatted images were also provided for further evaluation. Evaluation of the solid organs and vasculature is limited in the absence of intravenous contrast. FINDINGS: LOWER CHEST: Bibasilar dependent airspace disease. Dilated main pulmonary artery measuring 3.7 cm. Extensive coronary artery calcifications. Mass effect on the heart from the large mixed hiatal/paraesophageal hernia. LIVER: Scattered calcified granulomata. BILIARY: The gallbladder is surgically absent. Common bile duct dilation measuring 1.2 cm. Mild intrahepatic biliary ductal dilatation, unchanged compared to prior. SPLEEN: Spleen is medialized. Normal contour. PANCREAS: Diffuse fatty atrophy, limiting evaluation. ADRENAL GLANDS: Thickening of the bilateral adrenal glands. KIDNEYS/URETERS: Smooth renal contours.  No nephrolithiasis. Postsurgical changes of ileal conduit with mild bilateral hydroureteronephrosis. Multiple unchanged renal cysts which were previously characterized on MRI 01/24/2024. BLADDER: Surgically absent. REPRODUCTIVE ORGANS: Status post hysterectomy. GI TRACT: Large mixed hiatal/paraesophageal hernia with distention of the stomach and fluid extending into the esophagus.  Diffusely dilated small bowel measuring up to 3.2 cm. Transition point is noted along the distal aspect of the ileal conduit (2:97). The bowel is decompressed distal to this point. Colonic diverticulosis. The appendix is not clearly identified but there are no secondary signs of appendicitis. PERITONEUM/RETROPERITONEUM AND MESENTERY: No free air. No ascites. No fluid collection. VASCULATURE: Normal caliber aorta. Extensive atherosclerotic disease. LYMPH NODES: No adenopathy. BONES and SOFT TISSUES: Multilevel degenerative change of the spine with bone-on-bone apposition of the L4-L5 space. Small fat and bowel containing umbilical hernia without evidence of strangulation. Numerous calcifications and soft tissue nodules along the gluteal subcutaneous tissues, unchanged compared to prior.     --Small bowel obstruction with a transition point along the distal aspect of the ileostomy. - Bibasilar airspace disease concerning for aspiration with atelectasis. Clinical correlation is recommended to exclude superimposed infection. --Large mixed/paraesophageal hernia. --Other chronic findings as described in the body of the report.     XR Chest 2 views  Result Date: 02/26/2024  EXAM: XR CHEST 2  VIEWS DATE: 02/26/2024 5:13 PM ACCESSION: [id number] UN DICTATED: 02/26/2024 5:21 PM INTERPRETATION LOCATION: MAIN CAMPUS CLINICAL INDICATION: 83 years old Female with DYSPNEA  TECHNIQUE: Frontal and lateral views of the chest. COMPARISON: Chest radiograph 05/10/2022, CT chest 06/06/2023 FINDINGS: Lung: Elevation of the right hemidiaphragm with right basilar atelectasis. Pleura: No pleural effusion or pneumothorax identified. Mediastinum: Aortic arch calcifications. Large hiatal hernia. Cardiac silhouette is obscured. Bones: The bones appear demineralized. Left reverse total shoulder arthroplasty.     Large hiatal hernia and associated right basilar lung atelectasis. Superimposed infectious process or aspiration cannot be definitively excluded.     ECG 12 Lead  Result Date: 02/26/2024  SINUS RHYTHM WITH SHORT PR INTERVAL WITH OCCASIONAL PREMATURE VENTRICULAR BEATS RIGHTWARD AXIS CANNOT RULE OUT ANTERIOR INFARCT  (CITED ON OR BEFORE 21-Sep-2020) ABNORMAL ECG WHEN COMPARED WITH ECG OF 26-Feb-2024 15:41, FUSION COMPLEXES ARE NO LONGER PRESENT PR INTERVAL HAS DECREASED QUESTIONABLE CHANGE IN INITIAL FORCES OF ANTEROSEPTAL LEADS ST ELEVATION NOW PRESENT IN ANTERIOR LEADS    Concha Pyo, MD, PhD  General Surgery, PGY-2

## 2024-02-26 NOTE — Unmapped (Signed)
 ED Progress Note    Received sign out from previous provider.    Patient Summary: Tammy Braun is a 83 y.o. female with past medical history including bladder cancer for which she has an ileal conduit, COPD (no O2 requirement at baseline), HTN, HLD, DM, CKD, prior CVA, here for evaluation of right upper quadrant abdominal pain with associated nausea and vomiting.    Initial ED workup notable for significant leukocytosis to 18.5, hemoglobin 15.2, so there may be some degree of hemoconcentration, normal platelets at 314.  CMP notable for normal sodium and potassium, creatinine 2.02, up from a baseline close to 1.0, she is already received 1 fluid bolus.  Calcium mildly elevated at 10.6.  Troponin x 2 is flat, 19, 20.  Action List:   Patient signed out to me pending CT, UA, dispo.    Updates  ED Course as of 02/27/24 0451   Wed Feb 26, 2024   2254 CT abdomen pelvis without contrast  IMPRESSION:  --Small bowel obstruction with a transition point along the distal aspect of the ileostomy.     - Bibasilar airspace disease concerning for aspiration with atelectasis. Clinical correlation is recommended to exclude superimposed infection.     --Large mixed/paraesophageal hernia.     2254 Paged Surgery, added lactate    2317 UA sent     On exam, resting calmly bed, she is some urine in her ileal conduit bag.  She has no abdominal tenderness to palpation with the exception of some mild tenderness in the right lower quadrant.  Given this, will defer NG placement for now   2318 On 2 L nasal cannula while asleep, says she does not need to while awake, nursing decided on earlier while she was asleep.   Thu Feb 27, 2024   0006 NG placed   0006 Urinalysis with Microscopy with Culture Reflex(!):    Color, UA Yellow   Clarity, UA Turbid   Spec Grav, UA 1.015   pH, UA 7.5   Leukocyte Esterase, UA Moderate(!)   Nitrite, UA Negative   Protein, UA 300 mg/dL(!)   Glucose, UA Negative   Ketones, UA Negative   Urobilinogen, UA 4.0 mg/dL(!) Bilirubin, UA Negative   Blood, UA Small(!)   RBC, UA 42(!)   WBC, UA 71(!)   Squam Epithel, UA 3   Bacteria, UA Moderate(!)   Mucus, UA Few(!)  Concerning for UTI, most recent urine culture was susceptible to Rocephin which I have ordered   0117 Lactate, Venous(!): 2.3  Downtrending    0123 Surgery has seen patient, they are staffing now, awaiting recs     NG placed, good output. Ordered KUB to confirm placement    0304 Notified by nursing that she desaturated into the 80s, had to be placed on a nonrebreather.  When I came in the room, she was mentating appropriately, no increased work of breathing.  She very slowly came up to 93%.  We were able to wean her back down to 6 L nasal cannula.  I think with her history of COPD and NG tube in her nare and lying on the bed sleeping these were all contributing factors, she is looking quite comfortable now.   0355 Patient had another episode of desaturation that improved with being placed on nonrebreather.  I think the issue is that she has the NG tube in her near and since she is mouth breathing, not getting the nasal cannula.  Will continue to troubleshoot but she looks  well right now.    Still awaiting surgery recommendations.   4:51 AM-surgery has placed admission orders.

## 2024-02-27 LAB — BASIC METABOLIC PANEL
ANION GAP: 14 mmol/L (ref 5–14)
ANION GAP: 17 mmol/L — ABNORMAL HIGH (ref 5–14)
BLOOD UREA NITROGEN: 41 mg/dL — ABNORMAL HIGH (ref 9–23)
BLOOD UREA NITROGEN: 46 mg/dL — ABNORMAL HIGH (ref 9–23)
BUN / CREAT RATIO: 16
BUN / CREAT RATIO: 17
CALCIUM: 9.8 mg/dL (ref 8.7–10.4)
CALCIUM: 9.2 mg/dL (ref 8.7–10.4)
CHLORIDE: 94 mmol/L — ABNORMAL LOW (ref 98–107)
CHLORIDE: 95 mmol/L — ABNORMAL LOW (ref 98–107)
CO2: 30 mmol/L (ref 20.0–31.0)
CO2: 26 mmol/L (ref 20.0–31.0)
CREATININE: 2.63 mg/dL — ABNORMAL HIGH (ref 0.55–1.02)
CREATININE: 2.72 mg/dL — ABNORMAL HIGH (ref 0.55–1.02)
EGFR CKD-EPI (2021) FEMALE: 18 mL/min/{1.73_m2} — ABNORMAL LOW (ref >=60)
EGFR CKD-EPI (2021) FEMALE: 17 mL/min/{1.73_m2} — ABNORMAL LOW (ref >=60)
GLUCOSE RANDOM: 184 mg/dL — ABNORMAL HIGH (ref 70–179)
GLUCOSE RANDOM: 161 mg/dL — ABNORMAL HIGH (ref 70–99)
POTASSIUM: 5.3 mmol/L — ABNORMAL HIGH (ref 3.4–4.8)
POTASSIUM: 4.8 mmol/L (ref 3.4–4.8)
SODIUM: 138 mmol/L (ref 135–145)
SODIUM: 138 mmol/L (ref 135–145)

## 2024-02-27 LAB — CBC
HEMATOCRIT: 45.4 % — ABNORMAL HIGH (ref 34.0–44.0)
HEMOGLOBIN: 14.5 g/dL (ref 11.3–14.9)
MEAN CORPUSCULAR HEMOGLOBIN CONC: 31.9 g/dL — ABNORMAL LOW (ref 32.0–36.0)
MEAN CORPUSCULAR HEMOGLOBIN: 27.7 pg (ref 25.9–32.4)
MEAN CORPUSCULAR VOLUME: 86.8 fL (ref 77.6–95.7)
PLATELET COUNT: >271 10*9/L (ref 150–450)
RED BLOOD CELL COUNT: 5.23 10*12/L — ABNORMAL HIGH (ref 3.95–5.13)
RED CELL DISTRIBUTION WIDTH: 17.9 % — ABNORMAL HIGH (ref 12.2–15.2)
WBC ADJUSTED: 11.4 10*9/L — ABNORMAL HIGH (ref 3.6–11.2)

## 2024-02-27 LAB — LACTATE, VENOUS, WHOLE BLOOD
LACTATE BLOOD VENOUS: 2.3 mmol/L — ABNORMAL HIGH (ref 0.5–1.8)
LACTATE BLOOD VENOUS: 2.5 mmol/L — ABNORMAL HIGH (ref 0.5–1.8)

## 2024-02-27 LAB — CREATININE, URINE: CREATININE, URINE: 56.5 mg/dL

## 2024-02-27 LAB — MAGNESIUM
MAGNESIUM: 3.1 mg/dL — ABNORMAL HIGH (ref 1.6–2.6)
MAGNESIUM: 2.9 mg/dL — ABNORMAL HIGH (ref 1.6–2.6)

## 2024-02-27 LAB — PHOSPHORUS
PHOSPHORUS: 5.7 mg/dL — ABNORMAL HIGH (ref 2.4–5.1)
PHOSPHORUS: 5.8 mg/dL — ABNORMAL HIGH (ref 2.4–5.1)

## 2024-02-27 LAB — SODIUM, URINE, RANDOM: SODIUM URINE: 56 mmol/L

## 2024-02-27 MED ADMIN — morphine injection 2 mg: 2 mg | INTRAVENOUS | @ 03:00:00 | Stop: 2024-02-26

## 2024-02-27 MED ADMIN — heparin (porcine) 5,000 unit/mL injection 5,000 Units: 5000 [IU] | SUBCUTANEOUS | @ 20:00:00

## 2024-02-27 MED ADMIN — lactated Ringers infusion: 100 mL/h | INTRAVENOUS | @ 11:00:00 | Stop: 2024-02-27

## 2024-02-27 MED ADMIN — sodium chloride 0.9% (NS) bolus 500 mL: 500 mL | INTRAVENOUS | @ 18:00:00 | Stop: 2024-02-27

## 2024-02-27 MED ADMIN — sodium chloride 0.9% (NS) bolus 1,000 mL: 1000 mL | INTRAVENOUS | Stop: 2024-02-26

## 2024-02-27 MED ADMIN — umeclidinium (INCRUSE ELLIPTA) 62.5 mcg/actuation inhaler 1 puff: 1 | RESPIRATORY_TRACT | @ 14:00:00

## 2024-02-27 MED ADMIN — ondansetron (ZOFRAN) injection 4 mg: 4 mg | INTRAVENOUS | Stop: 2024-02-26

## 2024-02-27 MED ADMIN — cefTRIAXone (ROCEPHIN) 1 g in sodium chloride 0.9 % (NS) 100 mL IVPB-MBP: 1 g | INTRAVENOUS | @ 07:00:00 | Stop: 2024-02-27

## 2024-02-27 MED ADMIN — heparin (porcine) 5,000 unit/mL injection 5,000 Units: 5000 [IU] | SUBCUTANEOUS | @ 12:00:00

## 2024-02-27 NOTE — Unmapped (Signed)
 [facility] form (Order # [id number]) refaxed to [phone]

## 2024-02-27 NOTE — Unmapped (Addendum)
 [place] TRAUMA, ACUTE CARE, and GENERAL SURGERY   - Surgery Daily Progress Note -  02/27/2024     Admit Date: 02/26/2024, Hospital Day: 2  Hospital Service: [facility])  Attending: Margarite Gouge, MD  [facility] - SRH-4  General / Trauma Surgery - Trauma/CC     Assessment     Tammy Braun 83 y.o. female, PMH of bladder cancer s/p cystectomy, ileal conduit who presented to the ED with one day of nausea, emesis, abdominal distention. CT A/P showing SBO with transition point at ileal conduit. Labs with leukocytosis iso hemoconcentration, AKI on CKD, mildly elevated venous lactate improved with resuscitation.        Interval Events:  Admitted inially as floor status, iso of increased oxygen needs while in ER, transferred patient to step down status for closer monitoring this morning    Plan       Neuro:   *Hx of CVA  - Acute pain:Well controlled   scheduled IV tylenol and PRN IV dilaudid for breakthrough  - Flexeril PRN   - Holding home gabapentin     #Psychosocial:   *Hx of smoking   - Nicotine gym/lozenge PRN     CV:   *HTN, HLD  - HDS   - Continue home ASA, atorvastatin   - Holding home diltiazem, furosemide  - EKG on 2/26 showed: NSR w/ occasional PVC/PAC's  - Last ECHO done on 12/27/23 : EF estimated 55-60%    Resp:   *Hx of COPD  - NRB mask at 15 L initially, now requiring increased oxygen needs: now on HLNC 55%  - CXR 2/27: large right complex hernia w/ compressive atelectasis of right lung, no pleural effusion of pneumothorax   - ABG pending   - Continue pulmonary toliet: OOB and IS   - Continue home incruse ellipta  - Albuterol PRN     Fen/GI:   - Diet: NPO with sips of meds  - Fluids: MIVF at 100   - Zofran as needed for nausea   - Replete lytes prn: none needed today   - Bowel regimen: currently on hold iso of bowel obstruction     *Hyperkalemia:   - K: 5.3 up from 4.7, continue to monitor, labs ordered for 1200    GU:   *CKD  - Borderline UOP.   - Cr: 2.63 (baseline Cr is 1.05)     *Hydronephrosis   - Urine lytes sent   - Fena 1.9%   - NS 500 mL bolus x 1   - Could consider nephrology consult iso hyperkalemia, CKD     *Ileal conduit   - SRU consulted, recs pending    Heme  - Hb stable     ID:   - Afebrile.  - WBC- Leukocytosis downtrending to 11.4 from 18.5.  - RPP: negative     *UTI:   Ceftriaxone started on 2/26 for total course of 5 days   - Urine culture pending     Endocrine:   *Hx of DM   - Holding home glipizide  - Lispro insulin q4h   - POCT q4h    PPx:   heparin due to AKI/CKD    Dispo: Stepdown status  PT: pending  OT: pending  ST: not indicated  Barriers to discharge:  NGT with ILWS, diet advancement, urology recs, PT/OT recs when able   CM/SW assisting in discharge planning.     Contact: [facility] Stepdown [address] [place], [place]  Subjective     I want this tube out of my nose. Pain is tolerable     Objective     Vitals:   Temp:  [36.2 ??C (97.2 ??F)-36.9 ??C (98.5 ??F)] 36.7 ??C (98 ??F)  Heart Rate:  [80-93] 93  SpO2 Pulse:  [80-92] 92  Resp:  [17-22] 22  BP: (104-172)/(78-102) 153/87  MAP (mmHg):  [101-117] 105  FiO2 (%):  [100 %] 100 %  SpO2:  [85 %-97 %] 97 %  BMI (Calculated):  [25.52] 25.52    Intake/Output last 24 hours:  I/O last 3 completed shifts:  In: -   Out: 900 [Other:900]    Physical Exam:    -General:  Appropriate, holding a bucket with brown tinged emesis.  -Neurological: Alert and oriented x3. Moves all 4 extremities spontaneously.   -Psych: speech appropriate, pleasant affect  -HEENT:  NGT in place to [facility]  -Cardiovascular: Regular rate and rhythm.  -Pulmonary: Increased work of breathing, No accessory muscle use. Currently on HFNC at 55%  -Abdomen: Soft, non-distended. Non tender   -Genitourinary: Ileal conduit, stoma is pink and edematous, no urine in collection bag    -Extremities: Warm, well perfused, normal skin turgor.      Data Review:    I have reviewed the labs and studies from the last 24 hours.    Imaging: Radiology studies were personally reviewed

## 2024-02-27 NOTE — Unmapped (Signed)
 Neuro- Patient is alert and oriented but displays minor confusion. She is easily redirectable.   Respiratory- HFNC 50%at50lpm  Cardiac- NSR and Normotensive  GI/GU- Last bowel movement 02/25/24. Urostomy in place and draining yellow clear urine.  Skin-Mepelex in place on sacrum. Small round skin tear that is almost healed with a scab on the left buttock medial aspect.   Problem: Adult Inpatient Plan of Care  Goal: Plan of Care Review  Outcome: Ongoing - Unchanged  Goal: Patient-Specific Goal (Individualized)  Outcome: Ongoing - Unchanged  Goal: Absence of Hospital-Acquired Illness or Injury  Outcome: Ongoing - Unchanged  Intervention: Identify and Manage Fall Risk  Recent Flowsheet Documentation  Taken 02/27/2024 1430 by Josefa Half, RN  Safety Interventions: bed alarm  Intervention: Prevent Skin Injury  Recent Flowsheet Documentation  Taken 02/27/2024 1800 by Josefa Half, RN  Positioning for Skin: Right  Taken 02/27/2024 1600 by Josefa Half, RN  Positioning for Skin: Left  Taken 02/27/2024 1430 by Josefa Half, RN  Positioning for Skin: Right  Device Skin Pressure Protection:   absorbent pad utilized/changed   tubing/devices free from skin contact   adhesive use limited  Skin Protection:   adhesive use limited   tubing/devices free from skin contact  Intervention: Prevent and Manage VTE (Venous Thromboembolism) Risk  Recent Flowsheet Documentation  Taken 02/27/2024 1800 by Josefa Half, RN  Anti-Embolism Device Type: SCD, Knee  Anti-Embolism Device Status: On  Anti-Embolism Device Location: BLE  Taken 02/27/2024 1600 by Josefa Half, RN  Anti-Embolism Device Type: SCD, Knee  Anti-Embolism Device Status: On  Anti-Embolism Device Location: BLE  Taken 02/27/2024 1430 by Josefa Half, RN  Anti-Embolism Device Type: SCD, Knee  Anti-Embolism Device Status: On  Anti-Embolism Device Location: BLE  Goal: Optimal Comfort and Wellbeing  Outcome: Ongoing - Unchanged  Goal: Readiness for Transition of Care  Outcome: Ongoing - Unchanged  Goal: Rounds/Family Conference  Outcome: Ongoing - Unchanged     Problem: Infection  Goal: Absence of Infection Signs and Symptoms  Outcome: Ongoing - Unchanged     Problem: Fall Injury Risk  Goal: Absence of Fall and Fall-Related Injury  Outcome: Ongoing - Unchanged  Intervention: Promote Injury-Free Environment  Recent Flowsheet Documentation  Taken 02/27/2024 1430 by Josefa Half, RN  Safety Interventions: bed alarm     Problem: Wound  Goal: Optimal Coping  Outcome: Ongoing - Unchanged  Goal: Optimal Functional Ability  Outcome: Ongoing - Unchanged  Intervention: Optimize Functional Ability  Recent Flowsheet Documentation  Taken 02/27/2024 1800 by Josefa Half, RN  Activity Management: bedrest  Taken 02/27/2024 1600 by Josefa Half, RN  Activity Management: bedrest  Taken 02/27/2024 1430 by Josefa Half, RN  Activity Management: bedrest  Goal: Absence of Infection Signs and Symptoms  Outcome: Ongoing - Unchanged  Goal: Improved Oral Intake  Outcome: Ongoing - Unchanged  Goal: Optimal Pain Control and Function  Outcome: Ongoing - Unchanged  Intervention: Prevent or Manage Pain  Recent Flowsheet Documentation  Taken 02/27/2024 1430 by Josefa Half, RN  Sleep/Rest Enhancement:   awakenings minimized   consistent schedule promoted  Goal: Skin Health and Integrity  Outcome: Ongoing - Unchanged  Intervention: Optimize Skin Protection  Recent Flowsheet Documentation  Taken 02/27/2024 1800 by Josefa Half, RN  Activity Management: bedrest  Head of Bed [facility]) Positioning: HOB at 30-45 degrees  Taken 02/27/2024 1600 by Josefa Half, RN  Activity Management: bedrest  Head of Bed [facility]) Positioning:  HOB at 30-45 degrees  Taken 02/27/2024 1430 by Josefa Half, RN  Activity Management: bedrest  Pressure Reduction Techniques:   frequent weight shift encouraged   heels elevated off bed  Head of Bed (HOB) Positioning: HOB at 30-45 degrees  Pressure Reduction Devices:   alternating pressure pump (APP)   specialty bed utilized  Skin Protection:   adhesive use limited   tubing/devices free from skin contact  Goal: Optimal Wound Healing  Outcome: Ongoing - Unchanged  Intervention: Promote Wound Healing  Recent Flowsheet Documentation  Taken 02/27/2024 1430 by Josefa Half, RN  Sleep/Rest Enhancement:   awakenings minimized   consistent schedule promoted     Problem: Skin Injury Risk Increased  Goal: Skin Health and Integrity  Outcome: Ongoing - Unchanged  Intervention: Optimize Skin Protection  Recent Flowsheet Documentation  Taken 02/27/2024 1800 by Josefa Half, RN  Activity Management: bedrest  Head of Bed [facility]) Positioning: HOB at 30-45 degrees  Taken 02/27/2024 1600 by Josefa Half, RN  Activity Management: bedrest  Head of Bed [facility]) Positioning: HOB at 30-45 degrees  Taken 02/27/2024 1430 by Josefa Half, RN  Activity Management: bedrest  Pressure Reduction Techniques:   frequent weight shift encouraged   heels elevated off bed  Head of Bed (HOB) Positioning: HOB at 30-45 degrees  Pressure Reduction Devices:   alternating pressure pump (APP)   specialty bed utilized  Skin Protection:   adhesive use limited   tubing/devices free from skin contact     Problem: Self-Care Deficit  Goal: Improved Ability to Complete Activities of Daily Living  Outcome: Ongoing - Unchanged

## 2024-02-27 NOTE — Unmapped (Signed)
 Copied from CRM #[id number]. Topic: Referral - Request for Documentation  >> Feb 27, 2024  8:44 AM Coralie Keens wrote:  Aldine Contes states that the order needs to be sent again [facility] received two orders and had previously sent 3 on 2/4. [facility] received a duplicate yesterday. [facility] is requesting the order for [id number] to be sent via fax    Department Name: Aldine Contes    Fax number: [phone]    Phone number: [phone]    Routine callback turnaround time: 24-48 business hours. Programmer, systems Notified)

## 2024-02-27 NOTE — Unmapped (Signed)
 Patient Advice Request Patient Name: Tammy Braun  Caller: Home Health  Name of Caller: Sonia  Contact Method: Telephone Call: Time- Any Time [phone]  Reason for Call: Aldine Contes is requesting the status of the orders placed for the patient. Homehealth plan of care and also the Physicians orders to schedule more visits    ** Sonia called in stating that the orders were received but were not signed    Previously Discussed: no  Appointment Offered: Declined  If offered accepted, scheduled appt date:

## 2024-02-27 NOTE — Unmapped (Addendum)
 Tammy Braun 83 y.o. female with PMH of bladder cancer for which she has an ileal conduit (2012), COPD (no O2 requirement at baseline), HTN, HLD, DM, CKD, prior CVA. Presented to the ER with c/o of projectile vomiting and abdominal pain. CT A/P showing SBO with transition point at ileal conduit. Labs with leukocytosis iso hemoconcentration, AKI on CKD, mildly elevated venous lactate improved with resuscitation. Transferred to [facility] 2/28 for worsening hypoxic respiratory failure requiring intubation. Long and complicated stay in the [facility] in the post operative period including: failed extubation x2,  klebsiella pna resistant to zosyn , acute respiratory failure, recurrent SBO, feeding intolerance, and aspiration.      - 02/26/24: Admitted to [facility], floor status, consult to urology for possible ileal conduit revision.   - 02/27/24: Patient level of care escalated to step-down iso increased oxygen needs while in ED.  - 02/28/24: Upgraded to ICU. Intubated at bed-side. OR for ex-lap, reduction of parastomal hernia, small bowel resection with primary anastomosis, lysis of adhesions, abdomen left open with ABThera in place.   - 03/01/24: OR for attempt at abdominal closure. Left open due to desaturations during closure attempt. ABThera in place.  - 03/03/24: RTOR for abdominal closure  -03/06/24: Vas cath placed, CRRT started   -03/09/24: Extubated to HFNC  -03/10/24: CRRT discontinued   - 03/11/24: Re-intubated for hypoxic respiratory failure 2/2 mucus plug   - 03/14/24 Extubated to HFNC   -03/15/24: Vas cath removed   -03/16/24: Placed on BiPap 2/2 aspiration and increased supplemental oxygen support/mucus plugging  -03/19/24: Re-intubated for mucus plugging. Therapeutic bronch, BAL sent.   -03/22/24: Extubated to HFNC    #Small bowel obstruction -Parastomal hernia: Imaging revealed evidence of bowel obstruction and a nasogastric tube was placed. The patient was taken to the OR 2/28 for ex-lap, reduction of parastomal hernia, small bowel resection with primary anastomosis, lysis of adhesions, abdomen left open with ABThera in place.   She was maintained on bowel rest with NG tube decompression and supportive care with anti-emetics, IV fluid resuscitation and electrolyte repletion.  Taken to the OR on 02/28/24 for ex-lap, reduction of parastomal hernia, small bowel resection with lysis of adhesions. Abthera placed and left open. Returned to OR 03/01/24 for attempt closure, unable to close due to desaturations while attempting to close. Returned to OR 03/03/24 for abdominal closure.      TF started via NG tube, intially the patient did not tolerate. CT A/P obtained 03/06/24 which demonstrated mild dilation of multiple fluid-filled loops of small bowel, concerning for recurrent SBO with transition point. Patient made NPO, bowel rest with NG tube decompression and supportive care resumed. Once having bowel movements TF were restarted but not able to maintain at goal rate iso emesis episodes which led to aspiration PNA and multi-pressor shock. In interm nutrition was supported with TPN.   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 This hernia has been present for many years dating back and largely unchanged from CT abdomen/pelvis in 2012 and markedly unchanged on CT from 3/14 from the CT obtained on initial presentation.   TF bridged from TPN. Currently able to tolerate TF. SLP consulted. Patient continues to be a high aspiration risk. MBSS completed and recommended patient trial regular diet. Corpak was removed on 4/2 after patient was tolerating enough oral intake    #Acute hypoxic respiratory failure: Transferred to ICU 02/28/24 for increasing  oxygen needs and was intubated. Failed extubation x2. Last extubation 3/23 and weaned to Nasal Canula. Daily chest xray. Diuresis was given on an as needed basis until patient was weaned to RA.  Pt intermittently placed back on nasal cannula per family preference, but prior to placing on oxygen O2 sats remained stable, not indicating O2 supplementation.     #Urinary tract infection: UA dirty, urine culture showed Mixed GPO/GNO. Ceftriaxone  started on 2/26 for 5 days (2/26-3/3). Per SRU, patient is colonizied so will probably have a + urine culture.  Treated for UTI given foul smelling urine. Re culture from 3/13 NGTD.    #AKI: AKI from ATN 2/2 to volume depletion from vomiting + hypotension + CIN. Baseline Cr around 1.05.  Urine lytes sent on 2/27. Showed FeNA score of 1.9%. Creatinine trended up and 3/7 required CRRT. AKI resolved as evidenced by down trending creatinine and by discharge was WNL.      #Mild bilateral hydroureteronephrosis: SRU consulted on 2/27, recommended no acute surgical intervention, re-consider re-imaging based on trend.   Urology re-engaged when decision to take to OR.  Intra-op urology interrogated stoma with no signs of leak. Catheter placed in stoma with return of UOP. Creatinine at baseline, foley out.      #Afib-flutter w/ RVR / Hx of paroxysmal Afib, CHADSVASC score of 6:  - Was taking diltiazem  outpatient; previously held due to likely poor absorption in the setting of SBO.  Amiodarone  started in ICU. Cardiology formally consulted in stepdown, and recommend continuining PO amiodarone  150-200 mg daily but not needed at discharge. Amiodarone  dose decreased down to 100 mg on 3/30 per daughter request. Patient HR elevated 4/1 and amiodarone  was increased to 150mg  per daughter request but OK per cardiology. By discharge was off amiodarone .  Transitioned from heparin  gtt to lovenox  on 3/30 and then Eliquis  on 04/10/24.     # PE:   - Filling defect in L anterior segmental artery on 3/5 CTA chest. Heparin  gtt converted to therapeutic lovenox  and then Eliquis  as above.     #Constipation: Patient unable to have BM and increased stool burden visualized on CXR. Received aggressive BR while admitted and was having regular BM by discharge.      She is voiding adequately, debilitated requiring PT/ OT, and her pain was controlled wth PO pain medications. She was examined by the Trauma Surgery team on the day of discharge and was deemed suitable for discharge home. She will be discharged on POD #47 in good condition.    I spent greater than 30 minutes completing this discharge.

## 2024-02-27 NOTE — Unmapped (Signed)
 UROLOGY CONSULT NOTE    Requesting Attending Physician:  Margarite Gouge, MD  Service Requesting Consult:  Peter Garter [facility])  Service Providing Consult: SRU  Consulting Attending: Dr. Katrinka Blazing     Assessment:  Patient is a 83 y.o. female with hx of bladder cancer s/p RCIC on 10/05/2011 for T2N0M0 with known parastomal hernia (previously stable and reducible) who presented to [facility] with N/V and abdominal distention 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     On evaluation, patient is stable but requiring large bore [place]. Labs notable for WBC 11.4 (down from 19 at admission) and elevated Cr to 2.63 (baseline ~1). UA demonstrates moderate LE, 42 WBCs, moderate bacteria (expected to be dirty). Urine culture pending. CTAP with findings of SBO as described above. Also with stable mild BL hydronephrosis.    In setting of stable hydronephrosis, my concern for an obstruction is relatively low, and her AKI is more likely related to poor PO intake and emesis. However, we will continue to monitor Cr and consider additional imaging if renal function does not improve. Of note, last loopogram negative for stricture or obstruction (11/08/22).     Recommendations:  No acute urologic intervention indicated.  Consider urine lytes to further evaluate AKI.   Obtain and document strict I&Os.   Of note, patient is colonized and will also have positive urine culture. Treat only for symptoms (I.e., foul smelling urine or changes in mental status).   Continue to monitor Cr. Can re-consider re-imaging based on trend.   We will continue to follow.     Thank you for this consult. Please page [phone] with any questions or concerns.    History of Present Illness:   Tammy Braun is seen in consultation for SBO at the request of Margarite Gouge, MD on the [facility]).     Endorses 1 day history of worsening abdominal pain associated with N/V. Feels constipation and has not had BM in at least a day. Minimal PO intake.     Denies change in urine output. Does not feel like her urine has been malodourous or has not noted decreased output from ostomy.     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    Chronic kidney disease     Depression, psychotic (CMS-HCC)     Diabetes mellitus (CMS-HCC)     in past    Emphysema of lung (CMS-HCC)     Financial difficulties     Frail elderly     Hearing impairment     Hernia     History of transfusion     Hyperlipidemia     Hypertension     Impaired mobility     Osteoporosis     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/08/2015    Visual impairment        Past Surgical History:   Past Surgical History:   Procedure Laterality Date    ABDOMINAL SURGERY      BLADDER SURGERY      BREAST CYST EXCISION      CHEMOTHERAPY  2012    bladder    GALLBLADDER SURGERY      stone removal    ILEOSTOMY  2012    PR  COLONOSCOPY FLX DX W/COLLJ SPEC WHEN PFRMD N/A 02/09/2015    Procedure: COLONOSCOPY, FLEXIBLE, PROXIMAL TO SPLENIC FLEXURE; DIAGNOSTIC, W/WO COLLECTION SPECIMEN BY BRUSH OR WASH;  Surgeon: Dewaine Conger, MD;  Location: [facility] PROCED[facility] [place];  Service: Gastroenterology    PR RECONSTR TOTAL SHOULDER IMPLANT Left 04/08/2018    Procedure: ARTHROPLASTY, GLENOHUMERAL JOINT; TOTAL SHOULDER(GLENOID & PROXIMAL HUMERAL REPLACEMENT(EG, TOTAL SHOULDER);  Surgeon: Tomasa Rand, MD;  Location: [facility] OR [facility];  Service: Ortho Sports Medicine    PR SIGMOIDOSCOPY,BIOPSY N/A 03/11/2015    Procedure: SIGMOIDOSCOPY, FLEXIBLE; WITH BIOPSY, SINGLE OR MULTIPLE;  Surgeon: Wilburt Finlay, MD;  [facility]: [facility];  Service: Gastroenterology    PR XCAPSL CTRC RMVL INSJ IO LENS PROSTH W/O ECP Left 06/05/2022    Procedure: EXTRACAPSULAR CATARACT REMOVAL W/INSERTION OF INTRAOCULAR LENS PROSTHESIS, MANUAL OR MECHANICAL TECHNIQUE WITHOUT ENDOSCOPIC CYCLOPHOTOCOAGULATION;  Surgeon: Garner Gavel, MD;  Location: [facility] OR [facility];  Service: Ophthalmology    PR XCAPSL CTRC RMVL INSJ IO LENS PROSTH W/O ECP Right 06/19/2022    Procedure: EXTRACAPSULAR CATARACT REMOVAL W/INSERTION OF INTRAOCULAR LENS PROSTHESIS, MANUAL OR MECHANICAL TECHNIQUE WITHOUT ENDOSCOPIC CYCLOPHOTOCOAGULATION;  Surgeon: Garner Gavel, MD;  Location: [facility] OR [facility];  Service: Ophthalmology       Medication:  Current Facility-Administered Medications   Medication Dose Route Frequency Provider Last Rate Last Admin    acetaminophen (OFIRMEV) 10 mg/mL injection 650 mg 65 mL  650 mg Intravenous Q6H PRN Concha Pyo, MD PhD        albuterol (PROVENTIL HFA;VENTOLIN HFA) 90 mcg/actuation inhaler 2 puff  2 puff Inhalation Q4H PRN Concha Pyo, MD PhD        aspirin chewable tablet 81 mg  81 mg Oral Daily Concha Pyo, MD PhD        atorvastatin (LIPITOR) tablet 40 mg  40 mg Oral Daily Concha Pyo, MD PhD        [START ON 02/28/2024] cefTRIAXone (ROCEPHIN) 1 g in sodium chloride 0.9 % (NS) 100 mL IVPB-MBP  1 g Intravenous Q24H Concha Pyo, MD PhD        cyclobenzaprine (FLEXERIL) tablet 5 mg  5 mg Oral BID PRN Concha Pyo, MD PhD        dextrose (D10W) 10% bolus 125 mL  12.5 g Intravenous Q10 Min PRN Concha Pyo, MD PhD        [Provider Hold] dilTIAZem (CARDIZEM CD) 24 hr capsule 120 mg  120 mg Oral Daily Concha Pyo, MD PhD        fluPHENAZine decanoate (PROLIXIN) injection 50 mg  50 mg Intramuscular Q30 Days Coralyn Helling, MD   50 mg at 02/10/24 1106    [Provider Hold] fluPHENAZine decanoate (PROLIXIN) injection 50 mg  50 mg Intramuscular Q30 Days Concha Pyo, MD PhD        [Provider Hold] furosemide (LASIX) tablet 20 mg  20 mg Oral Daily Concha Pyo, MD PhD        [Provider Hold] gabapentin (NEURONTIN) capsule 100 mg  100 mg Oral TID Concha Pyo, MD PhD        glucagon injection 1 mg  1 mg Intramuscular Once PRN Concha Pyo, MD PhD        heparin (porcine) 5,000 unit/mL injection 5,000 Units  5,000 Units Subcutaneous Q8H [facility] Concha Pyo, MD PhD   5,000 Units at 02/27/24 [phone]    HYDROmorphone (PF) (DILAUDID) injection  Syrg 0.2 mg  0.2 mg Intravenous Q2H PRN Concha Pyo, MD PhD        insulin lispro (HumaLOG) injection 0-20 Units  0-20 Units Subcutaneous Q4H [facility] Concha Pyo, MD PhD        lactated [place] infusion  100 mL/hr Intravenous Continuous Concha Pyo, MD PhD 100 mL/hr at 02/27/24 0549 100 mL/hr at 02/27/24 0549    nicotine polacrilex (NICORETTE) gum 4 mg  4 mg Buccal Q1H PRN Concha Pyo, MD PhD        Or    nicotine polacrilex (NICORETTE) lozenge 4 mg  4 mg Buccal Q1H PRN Concha Pyo, MD PhD        umeclidinium (INCRUSE ELLIPTA) 62.5 mcg/actuation inhaler 1 puff  1 puff Inhalation Daily (RT) Concha Pyo, MD PhD         Current Outpatient Medications   Medication Sig Dispense Refill    albuterol HFA 90 mcg/actuation inhaler Inhale 2 puffs every four (4) hours as needed for wheezing. 18 g 11    aspirin (ECOTRIN) 81 MG tablet Take 1 tablet (81 mg total) by mouth daily. 30 tablet 11    atorvastatin (LIPITOR) 40 MG tablet Take 1 tablet (40 mg total) by mouth daily. 90 tablet 3    blood sugar diagnostic (GLUCOSE BLOOD) Strp Disp test strips preferred by insurance plan. Testing qday, Dx: E11.9 (Type 2 DM- controlled) 30 strip 11    blood-glucose meter kit Disp. blood glucose meter kit preferred by patient's insurance. Check blood sugars as directed by provider. Dx: Diabetes, E11.9 1 each 1    candesartan (ATACAND) 16 MG tablet 1 po bid 60 tablet 11    cyclobenzaprine (FLEXERIL) 5 MG tablet Take 1 tablet (5 mg total) by mouth two (2) times a day as needed for muscle spasms. 60 tablet 5    dilTIAZem (CARDIZEM CD) 120 MG 24 hr capsule Take 1 capsule (120 mg total) by mouth daily. 30 capsule 11    DULoxetine (CYMBALTA) 30 MG capsule Take 1 capsule (30 mg total) by mouth daily. 30 capsule 11    fluPHENAZine decanoate (PROLIXIN) 25 mg/mL injection Inject 2 mL (50 mg total) into the muscle every thirty (30) days. 5 mL 11    furosemide (LASIX) 20 MG tablet TAKE 2 TABLETS BY MOUTH DAILY FOR 5 DAYS, THEN 1 TABLET DAILY 90 tablet 1    gabapentin (NEURONTIN) 100 MG capsule Take 1 capsule (100 mg total) by mouth Three (3) times a day. 90 capsule 2    glipiZIDE 2.5 mg Tab 1 po q day 30 tablet 5    inhalational spacing device (E-Z SPACER) Spcr 1 each by Miscellaneous route every four (4) hours as needed. 1 each 0    lancets Misc Disp. lancets #30 or amount allowed, Testing once Qday. Dx: E11.9 (Diabetes- controlled) 30 each 11    MEDICAL SUPPLY ITEM Entreal formula nutritionally supplement complete caloric density 3 application 3    MEDICAL SUPPLY ITEM Compression stockings knee high 2 each 0    polyethylene glycol (MIRALAX) 17 gram packet Take 17 g by mouth daily. 90 packet 3    senna (SENOKOT) 8.6 mg tablet Take 2 tablets by mouth nightly as needed for constipation. 60 tablet 0    tiotropium (SPIRIVA WITH HANDIHALER) 18 mcg inhalation capsule Place 1 capsule (18 mcg total) into inhaler and inhale daily as needed. 31 capsule 5    traMADol (ULTRAM) 50 mg tablet 1 po at bedtime  prn pain 30 tablet 5    trolamine salicylate (ASPERCREME TOP) Apply 1 Application topically as needed (pain).         Allergies:  Allergies   Allergen Reactions    Lisinopril Anaphylaxis and Swelling    Losartan Dizziness    Hctz [Hydrochlorothiazide]      SIADH       Social History:  Social History     Tobacco Use    Smoking status: Every Day     Current packs/day: 1.00     Average packs/day: 1 pack/day for 65.9 years (65.9 ttl pk-yrs)     Types: Cigarettes     Start date: 04/09/1958    Smokeless tobacco: Never    Tobacco comments:     15cpd   Vaping Use    Vaping status: Never Used   Substance Use Topics    Alcohol use: No     Alcohol/week: 0.0 standard drinks of alcohol    Drug use: No       Family History:  Family History   Problem Relation Age of Onset    Breast cancer Daughter 36    Diabetes Mother     Glaucoma Father     Colon cancer Neg Hx     Ovarian cancer Neg Hx     Endometrial cancer Neg Hx     Anesthesia problems Neg Hx     Bleeding Disorder Neg Hx        Review of Systems:  10 systems were reviewed and are negative except as noted specifically in the HPI.    Objective:     Intake/Output last 3 shifts:  I/O last 3 completed shifts:  In: -   Out: 900 [Other:900]  Vital signs in last 24 hours:  BP 122/91  - Pulse 80  - Temp 36.7 ??C (98 ??F) (Oral)  - Resp 22  - SpO2 (!) 86%     Physical Exam:  General:  No acute distress, well appearing and well nourished  HEENT: Normocephalic, atraumatic, pupils equal and round, sclera anicteric  Neck  Trachea midline, symmetrical  Lungs:   Increased WOB on LBNC.   Cardiac: Regular rate  Abdomen: Diffusely tender, slightly distended.   GU:   Ileal conduit, stoma is pink and edematous. Dark yellow urine in bag.    Extremities: Warm and well perfused  Neuro:             Alert and oriented, strength and sensation grossly normal      Most Recent Labs:  Recent Labs     Units 02/26/24  1914 02/27/24  0524   WBC 10*9/L 18.5* 11.4*   RBC 10*12/L 5.42* 5.23*   HGB g/dL 96.0* 45.4   HCT % 09.8* 45.4*   MCV fL 85.4 86.8   MCH pg 28.1 27.7   MCHC g/dL 11.9 14.7*   RDW % 82.9* 17.9*   PLT 10*9/L 314 >271   MPV fL 7.8  --      Recent Labs     Units 02/26/24  1914 02/27/24  0524   NA mmol/L 134* - 141 138   K mmol/L 4.7 - 4.8* 5.3*   CL mmol/L 90* 94*   CO2 mmol/L 28.0 30.0   BUN mg/dL 31* 41*   CREATININE mg/dL 5.62* 1.30*   GLU mg/dL 865* 784*   MG mg/dL 3.2* 3.1*     Recent Labs     Units 02/26/24  1914   ALT U/L 20  AST U/L 31   ALKPHOS U/L 101   ALBUMIN g/dL 4.5   PROT g/dL 9.2*   BILITOT mg/dL 0.5     No results for input(s): INR, APTT in the last 168 hours.    Microbiology Data:  No results found for: LABBLOO  Urine Culture, Comprehensive   Date Value Ref Range Status   05/10/2022 >100,000 CFU/mL Escherichia coli (A)  Final   05/10/2022 50,000 to 100,000 CFU/mL Pseudomonas aeruginosa (A)  Final     Comment:     Susceptibility Testing By Consultation Only   05/10/2022 50,000 to 100,000 CFU/mL Mixed Urogenital Flora (A)  Final   06/22/2020 (A)  Final    Mixed Gram Positive/Gram Negative Organisms Isolated   02/16/2020 (A)  Final    Mixed Gram Positive/Gram Negative Organisms Isolated   11/26/2016 >100,000 CFU/mL Klebsiella pneumoniae (A)  Final   11/26/2016 10,000 to 50,000 CFU/mL Pseudomonas aeruginosa (A)  Final     Comment:     Susceptibility Testing By Consultation Only   08/02/2013 (A)  Final    30,000 CFU/ML MIXED UROGENITAL FLORA  Reference Range: (Lower detectable limit 1000 cfu/ml)  (Lower detectable limit for Acute dysuria, Suprapubic tap,  and Post-prostatic massage specimen types = 100 cfu/ml)   08/02/2013   Final    >100,000 CFU/mL  Testing results predict SUSCEPTIBILITY for the oral agents  cefdinir, cefuroxime, and cephalexin when used for therapy of  uncomplicated UTIs due to this organism.   07/07/2013   Final    MIXED GRAM POSITIVE/NEGATIVE ORGANISMS ISOLATED  Reference Range: (Lower detectable limit 1000 cfu/ml)  (Lower detectable limit for Acute dysuria, Suprapubic tap,  and Post-prostatic massage specimen types = 100 cfu/ml)   09/17/2011 FINAL  MIXED UROGENITAL FLORA  Final     Comment:     Reference Range: (Lower detectable limit 1000 cfu/ml)  (Lower detectable limit for Acute dysuria, Suprapubic tap,  and Post-prostatic massage specimen types = 100 cfu/ml)   03/23/2011 FINAL  MIXED UROGENITAL FLORA  Final     Comment:     Reference Range: (Lower detectable limit 1000 cfu/ml)  (Lower detectable limit for Acute dysuria, Suprapubic tap,  and Post-prostatic massage specimen types = 100 cfu/ml)   03/18/2011 FINAL  NO GROWTH  Final     Comment:     Reference Range: (Lower detectable limit 1000 cfu/ml)  (Lower detectable limit for Acute dysuria, Suprapubic tap,  and Post-prostatic massage specimen types = 100 cfu/ml)     No results found for: ANACX  Most recent Urinalysis:  Recent Labs     Units 02/26/24  2313   LEUKOCYTESUR  Moderate*   NITRITE  Negative   RBCUA /HPF 42*   WBCUA /HPF 71*   SQUEPIU /HPF 3   BACTERIA /HPF Moderate*      Urinalysis History:  Leukocyte Esterase, UA   Date Value Ref Range Status   02/26/2024 Moderate (A) Negative Final   05/10/2022 Large (A) Negative Final   06/22/2020 Moderate (A) Negative Final   02/16/2020 Moderate (A) Negative Final   11/26/2016 Moderate (A) Negative Final   08/02/2013 +1 NEGATIVE Final   07/07/2013 +2 NEGATIVE Final   09/17/2011 +2 NEGATIVE Final   03/18/2011 NEGATIVE NEGATIVE Final     Nitrite, UA   Date Value Ref Range Status   02/26/2024 Negative Negative Final   05/10/2022 Positive (A) Negative Final   06/22/2020 Negative Negative Final   02/16/2020 Positive (A) Negative Final   11/26/2016 Negative Negative Final  08/02/2013 NEGATIVE NEGATIVE Final   07/07/2013 NEGATIVE NEGATIVE Final   09/17/2011 NEGATIVE NEGATIVE Final   03/18/2011 NEGATIVE NEGATIVE Final     RBC, UA   Date Value Ref Range Status   02/26/2024 42 (H) <=4 /HPF Final   05/10/2022 4 <=4 /HPF Final   06/22/2020 2 <=4 /HPF Final   02/16/2020 3 <4 /HPF Final   11/26/2016 21 (H) <4 /HPF Final     WBC, UA   Date Value Ref Range Status   02/26/2024 71 (H) 0 - 5 /HPF Final   05/10/2022 6 (H) 0 - 5 /HPF Final   06/22/2020 25 (H) 0 - 5 /HPF Final   02/16/2020 10 (H) 0 - 5 /HPF Final   11/26/2016 12 (H) 0 - 5 /HPF Final     Squam Epithel, UA   Date Value Ref Range Status   02/26/2024 3 0 - 5 /HPF Final   05/10/2022 1 0 - 5 /HPF Final   06/22/2020 <1 0 - 5 /HPF Final   02/16/2020 2 0 - 5 /HPF Final   11/26/2016 <1 0 - 5 /HPF Final   09/17/2011 3 /HPF Final   03/18/2011 <1 /HPF Final     Bacteria, UA   Date Value Ref Range Status   02/26/2024 Moderate (A) None Seen /HPF Final   05/10/2022 Few (A) None Seen /HPF Final   06/22/2020 Many (A) None Seen /HPF Final   02/16/2020 Few (A) None Seen /HPF Final   11/26/2016 Many (A) None Seen /HPF Final   08/02/2013 FEW  Final   07/07/2013 MANY  Final   03/18/2011 RARE  Final        Imaging:  XR Abdomen 1 View  Result Date: 02/27/2024  EXAM: XR ABDOMEN 1 VIEW ACCESSION: [id number] UN REPORT DATE: 02/27/2024 4:35 AM     CLINICAL INDICATION: 83 years old with NGT (CATHETER VASCULAR FIT & ADJ)      COMPARISON: CT 03/25/2024     TECHNIQUE: Semierect view of the abdomen     FINDINGS:     The enteric tube overlies the expected position of the stomach. Similar findings of large hiatal hernia. Multiple dilated loops of small bowel consistent with known obstruction. Limited evaluation of the lung bases due to overpenetration. No acute osseous abnormalities. Surgical clips overlie the right lower quadrant.         Esophagogastric tube overlies the expected position of the stomach.         CT abdomen pelvis without contrast  Result Date: 02/26/2024  EXAM: CT ABDOMEN PELVIS WO CONTRAST ACCESSION: [id number] UN REPORT DATE: 02/26/2024 9:35 PM CLINICAL INDICATION: 83 years old with Acute RUQ abd pain, N/V, leukocytosis      COMPARISON: CT 05/10/2022 and prior; MRI 01/24/2024     TECHNIQUE: A spiral CT scan was obtained without IV contrast from the lung bases to the pubic symphysis.  Images were reconstructed in the axial plane. Coronal and sagittal reformatted images were also provided for further evaluation.     Evaluation of the solid organs and vasculature is limited in the absence of intravenous contrast.         FINDINGS:     LOWER CHEST: Bibasilar dependent airspace disease. Dilated main pulmonary artery measuring 3.7 cm. Extensive coronary artery calcifications. Mass effect on the heart from the large mixed hiatal/paraesophageal hernia.     LIVER: Scattered calcified granulomata.     BILIARY: The gallbladder is surgically absent. Common bile duct dilation measuring 1.2 cm. Mild  intrahepatic biliary ductal dilatation, unchanged compared to prior.     SPLEEN: Spleen is medialized. Normal contour.     PANCREAS: Diffuse fatty atrophy, limiting evaluation.     ADRENAL GLANDS: Thickening of the bilateral adrenal glands.     KIDNEYS/URETERS: Smooth renal contours.  No nephrolithiasis. Postsurgical changes of ileal conduit with mild bilateral hydroureteronephrosis. Multiple unchanged renal cysts which were previously characterized on MRI 01/24/2024.     BLADDER: Surgically absent.     REPRODUCTIVE ORGANS: Status post hysterectomy.     GI TRACT: Large mixed hiatal/paraesophageal hernia with distention of the stomach and fluid extending into the esophagus.  Diffusely dilated small bowel measuring up to 3.2 cm. Transition point is noted along the distal aspect of the ileal conduit (2:97). The bowel is decompressed distal to this point. Colonic diverticulosis. The appendix is not clearly identified but there are no secondary signs of appendicitis.     PERITONEUM/RETROPERITONEUM AND MESENTERY: No free air. No ascites. No fluid collection.     VASCULATURE: Normal caliber aorta. Extensive atherosclerotic disease.     LYMPH NODES: No adenopathy.     BONES and SOFT TISSUES: Multilevel degenerative change of the spine with bone-on-bone apposition of the L4-L5 space. Small fat and bowel containing umbilical hernia without evidence of strangulation. Numerous calcifications and soft tissue nodules along the gluteal subcutaneous tissues, unchanged compared to prior.         --Small bowel obstruction with a transition point along the distal aspect of the ileostomy.     - Bibasilar airspace disease concerning for aspiration with atelectasis. Clinical correlation is recommended to exclude superimposed infection.     --Large mixed/paraesophageal hernia.     --Other chronic findings as described in the body of the report.         XR Chest 2 views  Result Date: 02/26/2024  EXAM: XR CHEST 2 VIEWS DATE: 02/26/2024 5:13 PM ACCESSION: [id number] UN DICTATED: 02/26/2024 5:21 PM INTERPRETATION LOCATION: MAIN CAMPUS     CLINICAL INDICATION: 83 years old Female with DYSPNEA      TECHNIQUE: Frontal and lateral views of the chest.     COMPARISON: Chest radiograph 05/10/2022, CT chest 06/06/2023     FINDINGS:     Lung: Elevation of the right hemidiaphragm with right basilar atelectasis.     Pleura: No pleural effusion or pneumothorax identified.     Mediastinum: Aortic arch calcifications. Large hiatal hernia. Cardiac silhouette is obscured.     Bones: The bones appear demineralized. Left reverse total shoulder arthroplasty.         Large hiatal hernia and associated right basilar lung atelectasis. Superimposed infectious process or aspiration cannot be definitively excluded.         ECG 12 Lead  Result Date: 02/26/2024  SINUS RHYTHM WITH SHORT PR INTERVAL WITH OCCASIONAL PREMATURE VENTRICULAR BEATS RIGHTWARD AXIS CANNOT RULE OUT ANTERIOR INFARCT  (CITED ON OR BEFORE 21-Sep-2020) ABNORMAL ECG WHEN COMPARED WITH ECG OF 26-Feb-2024 15:41, FUSION COMPLEXES ARE NO LONGER PRESENT PR INTERVAL HAS DECREASED QUESTIONABLE CHANGE IN INITIAL FORCES OF ANTEROSEPTAL LEADS ST ELEVATION NOW PRESENT IN ANTERIOR LEADS

## 2024-02-28 LAB — BASIC METABOLIC PANEL
ANION GAP: 14 mmol/L (ref 5–14)
ANION GAP: 16 mmol/L — ABNORMAL HIGH (ref 5–14)
ANION GAP: 18 mmol/L — ABNORMAL HIGH (ref 5–14)
BLOOD UREA NITROGEN: 58 mg/dL — ABNORMAL HIGH (ref 9–23)
BLOOD UREA NITROGEN: 59 mg/dL — ABNORMAL HIGH (ref 9–23)
BLOOD UREA NITROGEN: 65 mg/dL — ABNORMAL HIGH (ref 9–23)
BUN / CREAT RATIO: 24
BUN / CREAT RATIO: 27
BUN / CREAT RATIO: 29
CALCIUM: 8.6 mg/dL — ABNORMAL LOW (ref 8.7–10.4)
CALCIUM: 8.4 mg/dL — ABNORMAL LOW (ref 8.7–10.4)
CALCIUM: 9.2 mg/dL (ref 8.7–10.4)
CHLORIDE: 100 mmol/L (ref 98–107)
CHLORIDE: 103 mmol/L (ref 98–107)
CHLORIDE: 104 mmol/L (ref 98–107)
CO2: 25 mmol/L (ref 20.0–31.0)
CO2: 21 mmol/L (ref 20.0–31.0)
CO2: 20 mmol/L (ref 20.0–31.0)
CREATININE: 2.42 mg/dL — ABNORMAL HIGH (ref 0.55–1.02)
CREATININE: 2.2 mg/dL — ABNORMAL HIGH (ref 0.55–1.02)
CREATININE: 2.27 mg/dL — ABNORMAL HIGH (ref 0.55–1.02)
EGFR CKD-EPI (2021) FEMALE: 20 mL/min/{1.73_m2} — ABNORMAL LOW (ref >=60)
EGFR CKD-EPI (2021) FEMALE: 22 mL/min/{1.73_m2} — ABNORMAL LOW (ref >=60)
EGFR CKD-EPI (2021) FEMALE: 21 mL/min/{1.73_m2} — ABNORMAL LOW (ref >=60)
GLUCOSE RANDOM: 155 mg/dL (ref 70–179)
GLUCOSE RANDOM: 142 mg/dL (ref 70–179)
GLUCOSE RANDOM: 150 mg/dL (ref 70–179)
POTASSIUM: 4.4 mmol/L (ref 3.4–4.8)
POTASSIUM: 4.7 mmol/L (ref 3.4–4.8)
POTASSIUM: 4.6 mmol/L (ref 3.4–4.8)
SODIUM: 139 mmol/L (ref 135–145)
SODIUM: 140 mmol/L (ref 135–145)
SODIUM: 142 mmol/L (ref 135–145)

## 2024-02-28 LAB — CBC
HEMATOCRIT: 36.8 % (ref 34.0–44.0)
HEMATOCRIT: 35 % (ref 34.0–44.0)
HEMATOCRIT: 36.9 % (ref 34.0–44.0)
HEMOGLOBIN: 12 g/dL (ref 11.3–14.9)
HEMOGLOBIN: 11.1 g/dL — ABNORMAL LOW (ref 11.3–14.9)
HEMOGLOBIN: 11.8 g/dL (ref 11.3–14.9)
MEAN CORPUSCULAR HEMOGLOBIN CONC: 32.5 g/dL (ref 32.0–36.0)
MEAN CORPUSCULAR HEMOGLOBIN CONC: 31.6 g/dL — ABNORMAL LOW (ref 32.0–36.0)
MEAN CORPUSCULAR HEMOGLOBIN CONC: 32 g/dL (ref 32.0–36.0)
MEAN CORPUSCULAR HEMOGLOBIN: 28.3 pg (ref 25.9–32.4)
MEAN CORPUSCULAR HEMOGLOBIN: 27.7 pg (ref 25.9–32.4)
MEAN CORPUSCULAR HEMOGLOBIN: 27.9 pg (ref 25.9–32.4)
MEAN CORPUSCULAR VOLUME: 87.1 fL (ref 77.6–95.7)
MEAN CORPUSCULAR VOLUME: 87.6 fL (ref 77.6–95.7)
MEAN CORPUSCULAR VOLUME: 87 fL (ref 77.6–95.7)
MEAN PLATELET VOLUME: 8.4 fL (ref 6.8–10.7)
MEAN PLATELET VOLUME: 8.3 fL (ref 6.8–10.7)
MEAN PLATELET VOLUME: 8.6 fL (ref 6.8–10.7)
PLATELET COUNT: 234 10*9/L (ref 150–450)
PLATELET COUNT: 221 10*9/L (ref 150–450)
PLATELET COUNT: 244 10*9/L (ref 150–450)
RED BLOOD CELL COUNT: 4.23 10*12/L (ref 3.95–5.13)
RED BLOOD CELL COUNT: 3.99 10*12/L (ref 3.95–5.13)
RED BLOOD CELL COUNT: 4.23 10*12/L (ref 3.95–5.13)
RED CELL DISTRIBUTION WIDTH: 17.6 % — ABNORMAL HIGH (ref 12.2–15.2)
RED CELL DISTRIBUTION WIDTH: 17.7 % — ABNORMAL HIGH (ref 12.2–15.2)
RED CELL DISTRIBUTION WIDTH: 18.2 % — ABNORMAL HIGH (ref 12.2–15.2)
WBC ADJUSTED: 14.2 10*9/L — ABNORMAL HIGH (ref 3.6–11.2)
WBC ADJUSTED: 14.1 10*9/L — ABNORMAL HIGH (ref 3.6–11.2)
WBC ADJUSTED: 5.8 10*9/L (ref 3.6–11.2)

## 2024-02-28 LAB — BLOOD GAS CRITICAL CARE PANEL, ARTERIAL
BASE EXCESS ARTERIAL: -6 — ABNORMAL LOW (ref -2.0–2.0)
BASE EXCESS ARTERIAL: -5.7 — ABNORMAL LOW (ref -2.0–2.0)
BASE EXCESS ARTERIAL: -8.9 — ABNORMAL LOW (ref -2.0–2.0)
CALCIUM IONIZED ARTERIAL (MG/DL): 4.35 mg/dL — ABNORMAL LOW (ref 4.40–5.40)
CALCIUM IONIZED ARTERIAL (MG/DL): 5.25 mg/dL (ref 4.40–5.40)
CALCIUM IONIZED ARTERIAL (MG/DL): 5.11 mg/dL (ref 4.40–5.40)
FIO2 ARTERIAL: 90
FIO2 ARTERIAL: 90
FIO2 ARTERIAL: 50
GLUCOSE WHOLE BLOOD: 133 mg/dL (ref 70–179)
GLUCOSE WHOLE BLOOD: 138 mg/dL (ref 70–179)
GLUCOSE WHOLE BLOOD: 150 mg/dL (ref 70–179)
HCO3 ARTERIAL: 19 mmol/L — ABNORMAL LOW (ref 22–27)
HCO3 ARTERIAL: 21 mmol/L — ABNORMAL LOW (ref 22–27)
HCO3 ARTERIAL: 17 mmol/L — ABNORMAL LOW (ref 22–27)
HEMOGLOBIN BLOOD GAS: 10.4 g/dL — ABNORMAL LOW (ref 12.00–16.00)
HEMOGLOBIN BLOOD GAS: 11.1 g/dL — ABNORMAL LOW (ref 12.00–16.00)
HEMOGLOBIN BLOOD GAS: 11.4 g/dL — ABNORMAL LOW (ref 12.00–16.00)
LACTATE BLOOD ARTERIAL: 1.2 mmol/L (ref <1.3)
LACTATE BLOOD ARTERIAL: 1.3 mmol/L — ABNORMAL HIGH (ref <1.3)
LACTATE BLOOD ARTERIAL: 1.4 mmol/L — ABNORMAL HIGH (ref <1.3)
O2 SATURATION ARTERIAL: 99.8 % (ref 94.0–100.0)
O2 SATURATION ARTERIAL: 99.7 % (ref 94.0–100.0)
O2 SATURATION ARTERIAL: 96.7 % (ref 94.0–100.0)
PCO2 ARTERIAL: 37.1 mmHg (ref 35.0–45.0)
PCO2 ARTERIAL: 50.2 mmHg — ABNORMAL HIGH (ref 35.0–45.0)
PCO2 ARTERIAL: 38.8 mmHg (ref 35.0–45.0)
PH ARTERIAL: 7.33 — ABNORMAL LOW (ref 7.35–7.45)
PH ARTERIAL: 7.24 — ABNORMAL LOW (ref 7.35–7.45)
PH ARTERIAL: 7.26 — ABNORMAL LOW (ref 7.35–7.45)
PO2 ARTERIAL: 264 mmHg — ABNORMAL HIGH (ref 80.0–110.0)
PO2 ARTERIAL: 223 mmHg — ABNORMAL HIGH (ref 80.0–110.0)
PO2 ARTERIAL: 89.2 mmHg (ref 80.0–110.0)
POTASSIUM WHOLE BLOOD: 4.4 mmol/L (ref 3.4–4.6)
POTASSIUM WHOLE BLOOD: 4.7 mmol/L — ABNORMAL HIGH (ref 3.4–4.6)
POTASSIUM WHOLE BLOOD: 4.4 mmol/L (ref 3.4–4.6)
SODIUM WHOLE BLOOD: 139 mmol/L (ref 135–145)
SODIUM WHOLE BLOOD: 137 mmol/L (ref 135–145)
SODIUM WHOLE BLOOD: 141 mmol/L (ref 135–145)

## 2024-02-28 LAB — BLOOD GAS, ARTERIAL
BASE EXCESS ARTERIAL: -0.9 (ref -2.0–2.0)
BASE EXCESS ARTERIAL: -1.2 (ref -2.0–2.0)
FIO2 ARTERIAL: 55
FIO2 ARTERIAL: 55
HCO3 ARTERIAL: 26 mmol/L (ref 22–27)
HCO3 ARTERIAL: 24 mmol/L (ref 22–27)
O2 SATURATION ARTERIAL: 68.8 % — CL (ref 94.0–100.0)
O2 SATURATION ARTERIAL: 93.3 % — ABNORMAL LOW (ref 94.0–100.0)
PCO2 ARTERIAL: 49.8 mmHg — ABNORMAL HIGH (ref 35.0–45.0)
PCO2 ARTERIAL: 43.2 mmHg (ref 35.0–45.0)
PH ARTERIAL: 7.32 — ABNORMAL LOW (ref 7.35–7.45)
PH ARTERIAL: 7.36 (ref 7.35–7.45)
PO2 ARTERIAL: 41.3 mmHg — ABNORMAL LOW (ref 80.0–110.0)
PO2 ARTERIAL: 72.9 mmHg — ABNORMAL LOW (ref 80.0–110.0)

## 2024-02-28 LAB — PHOSPHORUS
PHOSPHORUS: 5.5 mg/dL — ABNORMAL HIGH (ref 2.4–5.1)
PHOSPHORUS: 6 mg/dL — ABNORMAL HIGH (ref 2.4–5.1)

## 2024-02-28 LAB — MAGNESIUM
MAGNESIUM: 2.8 mg/dL — ABNORMAL HIGH (ref 1.6–2.6)
MAGNESIUM: 2.8 mg/dL — ABNORMAL HIGH (ref 1.6–2.6)
MAGNESIUM: 2.9 mg/dL — ABNORMAL HIGH (ref 1.6–2.6)

## 2024-02-28 LAB — TRIGLYCERIDES: TRIGLYCERIDES: 111 mg/dL (ref 0–150)

## 2024-02-28 MED ADMIN — lactated ringers bolus 1,000 mL: 1000 mL | INTRAVENOUS | @ 20:00:00 | Stop: 2024-02-28

## 2024-02-28 MED ADMIN — heparin (porcine) 5,000 unit/mL injection 5,000 Units: 5000 [IU] | SUBCUTANEOUS | @ 10:00:00

## 2024-02-28 MED ADMIN — ROCuronium (ZEMURON) injection: INTRAVENOUS | @ 23:00:00 | Stop: 2024-02-28

## 2024-02-28 MED ADMIN — sodium chloride (NS) 0.9 % infusion: 100 mL/h | INTRAVENOUS | @ 04:00:00

## 2024-02-28 MED ADMIN — NORepinephrine 8 mg in dextrose 5 % 250 mL (32 mcg/mL) infusion PMB: 0-10 ug/min | INTRAVENOUS | @ 20:00:00

## 2024-02-28 MED ADMIN — sodium chloride 0.9% (NS) bolus 500 mL: 500 mL | INTRAVENOUS | @ 03:00:00 | Stop: 2024-02-27

## 2024-02-28 MED ADMIN — metoPROLOL (Lopressor) injection 5 mg: 5 mg | INTRAVENOUS | @ 10:00:00 | Stop: 2024-02-28

## 2024-02-28 MED ADMIN — fentaNYL (PF) (SUBLIMAZE) injection: INTRAVENOUS | @ 23:00:00 | Stop: 2024-02-28

## 2024-02-28 MED ADMIN — Propofol (DIPRIVAN) 10 mg/mL injection: INTRAVENOUS | @ 20:00:00 | Stop: 2024-02-28

## 2024-02-28 MED ADMIN — dilTIAZem (CARDIZEM CD) 24 hr capsule 120 mg: 120 mg | ORAL | @ 14:00:00

## 2024-02-28 MED ADMIN — metroNIDAZOLE (FLAGYL) IVPB 500 mg: 500 mg | INTRAVENOUS | @ 12:00:00 | Stop: 2024-02-28

## 2024-02-28 MED ADMIN — metroNIDAZOLE (FLAGYL) IVPB 500 mg: 500 mg | INTRAVENOUS | @ 05:00:00 | Stop: 2024-02-28

## 2024-02-28 MED ADMIN — heparin (porcine) 5,000 unit/mL injection 5,000 Units: 5000 [IU] | SUBCUTANEOUS | @ 22:00:00

## 2024-02-28 MED ADMIN — albuterol 2.5 mg /3 mL (0.083 %) nebulizer solution: RESPIRATORY_TRACT | @ 18:00:00 | Stop: 2024-02-28

## 2024-02-28 MED ADMIN — heparin (porcine) 5,000 unit/mL injection 5,000 Units: 5000 [IU] | SUBCUTANEOUS | @ 04:00:00

## 2024-02-28 MED ADMIN — propofol (DIPRIVAN) infusion 10 mg/mL: 0-50 ug/kg/min | INTRAVENOUS | @ 20:00:00

## 2024-02-28 MED ADMIN — fentaNYL PF (SUBLIMAZE) (50 mcg/mL) infusion (bag): 0-200 ug/h | INTRAVENOUS | @ 20:00:00 | Stop: 2024-03-13

## 2024-02-28 MED ADMIN — EPINEPHrine 100 mcg/10 mL (10 mcg/mL) injection: INTRAVENOUS | @ 20:00:00

## 2024-02-28 MED ADMIN — NORepinephrine bitartrate-D5W 8 mg/250 mL (32 mcg/mL) infusion: INTRAVENOUS | @ 20:00:00 | Stop: 2024-02-28

## 2024-02-28 MED ADMIN — cefTRIAXone (ROCEPHIN) 1 g in sodium chloride 0.9 % (NS) 100 mL IVPB-MBP: 1 g | INTRAVENOUS | @ 10:00:00 | Stop: 2024-02-28

## 2024-02-28 MED ADMIN — fentaNYL (PF) (SUBLIMAZE) injection 50 mcg: 50 ug | INTRAVENOUS | @ 21:00:00 | Stop: 2024-03-13

## 2024-02-28 MED ADMIN — phenylephrine 1 mg/10 mL (100 mcg/mL) injection Syrg: INTRAVENOUS | Stop: 2024-02-28

## 2024-02-28 MED ADMIN — sodium chloride 0.9% (NS) bolus 250 mL: 250 mL | INTRAVENOUS | @ 14:00:00 | Stop: 2024-02-28

## 2024-02-28 MED ADMIN — Propofol (DIPRIVAN) injection: INTRAVENOUS | @ 20:00:00

## 2024-02-28 MED ADMIN — metoPROLOL (Lopressor) injection 5 mg: 5 mg | INTRAVENOUS | @ 03:00:00 | Stop: 2024-02-27

## 2024-02-28 MED ADMIN — succinylcholine (ANECTINE) injection: INTRAVENOUS | @ 20:00:00

## 2024-02-28 MED ADMIN — acetaminophen (OFIRMEV) 10 mg/mL injection 650 mg 65 mL: 650 mg | INTRAVENOUS | @ 15:00:00 | Stop: 2024-02-29

## 2024-02-28 MED ADMIN — propofol (DIPRIVAN) infusion 10 mg/mL: 0-50 ug/kg/min | INTRAVENOUS | @ 21:00:00

## 2024-02-28 MED ADMIN — sodium chloride 0.9% (NS) bolus 500 mL: 500 mL | INTRAVENOUS | @ 04:00:00

## 2024-02-28 MED ADMIN — phenylephrine 1 mg/10 mL (100 mcg/mL) injection Syrg: INTRAVENOUS | @ 23:00:00 | Stop: 2024-02-28

## 2024-02-28 MED ADMIN — albuterol 2.5 mg /3 mL (0.083 %) nebulizer solution 2.5 mg: 2.5 mg | RESPIRATORY_TRACT | @ 18:00:00

## 2024-02-28 MED ADMIN — aspirin chewable tablet 81 mg: 81 mg | ORAL | @ 14:00:00

## 2024-02-28 MED ADMIN — electrolyte-A (PLASMA-LYT A) infusion: INTRAVENOUS | @ 23:00:00 | Stop: 2024-02-28

## 2024-02-28 MED ADMIN — umeclidinium (INCRUSE ELLIPTA) 62.5 mcg/actuation inhaler 1 puff: 1 | RESPIRATORY_TRACT | @ 18:00:00

## 2024-02-28 MED ADMIN — NORepinephrine 8 mg in dextrose 5 % 250 mL (32 mcg/mL) infusion PMB: INTRAVENOUS | @ 23:00:00 | Stop: 2024-02-28

## 2024-02-28 MED ADMIN — atorvastatin (LIPITOR) tablet 40 mg: 40 mg | ORAL | @ 14:00:00

## 2024-02-28 NOTE — Unmapped (Signed)
 OCCUPATIONAL THERAPY  Evaluation (02/28/24 1047)    Patient Name:  Tammy Braun       Medical Record Number: [id number]     Date of Birth: 04/27/1941  Sex: Female      Post-Discharge Occupational Therapy Recommendations: 5x weekly, Low intensity          Equipment Recommendation  OT DME Recommendations: Defer to post acute       OT Treatment Diagnosis: Need for assistance with personal care         Assessment  Problem List: Decreased safety awareness, Impaired judgement, Decreased strength, Decreased range of motion, Decreased activity tolerance, Impaired balance, Decreased endurance, Fall risk, Impaired ADLs        Assessment: Tammy Braun 83 y.o. female, PMH of bladder cancer s/p cystectomy, ileal conduit who presented to the ED with one day of nausea, emesis, abdominal distention.  Pt presents with above stated deficits impacting her independence & safety completing ADLs & functional mobility/transfers.  Pt will benefit from skilled OT to maximize her functional independence, safety and decrease her burden of care.  Review of pt's occupational profile, client history, assessment of occupational performance, clinical decision making and development of POC required moderate complexity OT evaluation.       Interval Events: - 2/27: Admitted to floor status initially; upgraded to step down status for closer monitoring d/t increased O2 requirements - 2/28: Afib w/RVR overnight s/p 5 mg metoprolol x 2 and 1L fluid bolus   Today's Interventions: Pt educated on role of OT, POC, safety awareness, transfer training, importance & benefits of early engagement in ADLs    Activity Tolerance During Today's Session  Tolerated treatment well    Plan  Planned Frequency of Treatment: Plan of Care Initiated: 02/28/24  1-2x per day Weekly Frequency: 3-4 days per week  Planned Treatment Duration: 03/13/24    Planned Interventions:  Clinical research associate, Education (Patient/Family/Caregiver), Home Exercise Program, Self-Care/Home Training, Therapeutic Exercise, Therapeutic Activity      GOALS:   Patient and Family Goals: to get up to the chair    Short Term:   SHORT GOAL #1: Pt will complete toilet transfer with SBA.   Time Frame : 2 weeks  SHORT GOAL #2: Pt will complet toileting with mod assist.   Time Frame : 2 weeks  SHORT GOAL #3: Pt will complete UBD with min assist.   Time Frame : 2 weeks  SHORT GOAL #4: Pt will complete LBD with mod assist.   Time Frame : 2 weeks    Long Term Goal #1: Pt will score 15/24 on AMPAC  Time Frame: 4 weeks    Prognosis:  Good  Positive Indicators:  PLOF  Barriers to Discharge: Functional strength deficits, Decreased range of motion, Inability to safely perform ADLS, Decreased safety awareness, Endurance deficits, Impaired Balance, Gait instability    Subjective  Medical Updates Since Last Visit/Relevant PMH Affecting Clinical Decision Making:    Prior Functional Status Pt reports she completes ADLs at the following assist: eating=set up, grooming=set up, bathing=total assist (seated, sponge bath), UBD=total assist, LBD=total assist, toileting=total assist.  Pt uses RW or cane (difficult to consistent answer).    Medical Tests / Procedures: see below       Patient / Caregiver reports: Gimme my stick.  Come on.  Let's go.      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    Chronic kidney disease     Depression, psychotic (CMS-HCC)     Diabetes mellitus (CMS-HCC)     in past    Emphysema of lung (CMS-HCC)     Financial difficulties     Frail elderly     Hearing impairment     Hernia     History of transfusion     Hyperlipidemia     Hypertension     Impaired mobility     Osteoporosis     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/08/2015    Visual impairment     Social History     Tobacco Use    Smoking status: Every Day     Current packs/day: 1.00     Average packs/day: 1 pack/day for 65.9 years (65.9 ttl pk-yrs)     Types: Cigarettes     Start date: 04/09/1958    Smokeless tobacco: Never    Tobacco comments:     15cpd   Substance Use Topics    Alcohol use: No     Alcohol/week: 0.0 standard drinks of alcohol      Past Surgical History:   Procedure Laterality Date    ABDOMINAL SURGERY      BLADDER SURGERY      BREAST CYST EXCISION      CHEMOTHERAPY  2012    bladder    GALLBLADDER SURGERY      stone removal    ILEOSTOMY  2012    PR COLONOSCOPY FLX DX W/COLLJ SPEC WHEN PFRMD N/A 02/09/2015    Procedure: COLONOSCOPY, FLEXIBLE, PROXIMAL TO SPLENIC FLEXURE; DIAGNOSTIC, W/WO COLLECTION SPECIMEN BY BRUSH OR WASH;  Surgeon: Dewaine Conger, MD;  Location: [facility] [place];  Service: Gastroenterology    PR RECONSTR TOTAL SHOULDER IMPLANT Left 04/08/2018    Procedure: ARTHROPLASTY, GLENOHUMERAL JOINT; TOTAL SHOULDER(GLENOID & PROXIMAL HUMERAL REPLACEMENT(EG, TOTAL SHOULDER);  Surgeon: Tomasa Rand, MD;  Location: [facility] OR [facility];  Service: Ortho Sports Medicine    PR SIGMOIDOSCOPY,BIOPSY N/A 03/11/2015    Procedure: SIGMOIDOSCOPY, FLEXIBLE; WITH BIOPSY, SINGLE OR MULTIPLE;  Surgeon: Wilburt Finlay, MD;  [facility];  Service: Gastroenterology    PR XCAPSL CTRC RMVL INSJ IO LENS PROSTH W/O ECP Left 06/05/2022    Procedure: EXTRACAPSULAR CATARACT REMOVAL W/INSERTION OF INTRAOCULAR LENS PROSTHESIS, MANUAL OR MECHANICAL TECHNIQUE WITHOUT ENDOSCOPIC CYCLOPHOTOCOAGULATION;  Surgeon: Garner Gavel, MD;  Location: [facility] OR [facility];  Service: Ophthalmology    PR XCAPSL CTRC RMVL INSJ IO LENS PROSTH W/O ECP Right 06/19/2022    Procedure: EXTRACAPSULAR CATARACT REMOVAL W/INSERTION OF INTRAOCULAR LENS PROSTHESIS, MANUAL OR MECHANICAL TECHNIQUE WITHOUT ENDOSCOPIC CYCLOPHOTOCOAGULATION;  Surgeon: Garner Gavel, MD;  Location: [facility] OR [facility];  Service: Ophthalmology    Family History   Problem Relation Age of Onset Breast cancer Daughter 31    Diabetes Mother     Glaucoma Father     Colon cancer Neg Hx     Ovarian cancer Neg Hx     Endometrial cancer Neg Hx     Anesthesia problems Neg Hx     Bleeding Disorder Neg Hx         Lisinopril, Losartan, and Hctz [hydrochlorothiazide]     Objective Findings  Precautions / Restrictions  Falls precautions       Weight Bearing  Non-applicable    Required Braces or  Orthoses  Non-applicable    Communication Preference  Verbal       Pain  no c/o pain    Equipment / Environment  Vascular access (PIV, TLC, Port-a-cath, PICC), NGT, Telemetry, Foley    Living Situation  Living Environment: House  Lives With: Daughter  Home Living: Ramped entrance, One level home  Caregiver Identified?: Yes  Caregiver Availability: Intermittent  Equipment available at home: Straight cane, Rolling walker, Lift-recliner (possible W/C vs transport chair will need to confirm)     Cognition   Orientation Level:  Oriented x 4   Arousal/Alertness:  Appropriate responses to stimuli   Attention Span:  Attends with cues to redirect   Memory:  Decreased short term memory   Following Commands:  Follows one step commands with repetition   Safety Judgment:  Decreased awareness of need for safety, Impulsivity   Awareness of Errors and Problem Solving:  Assistance required to identify errors made, Assistance required to generate solutions, Assistance required to implement solutions   Comments:      Vision / Hearing   Vision: Wears glasses all the time, Glasses present     Hearing: No deficit identified         Hand Function:  Right Hand Function: Right hand grip strength, ROM and coordination WNL  Left Hand Function: Left hand grip strength, ROM and coordination WNL  Hand Dominance: Right    Skin Inspection:  Skin Inspection: Intact where visualized      ROM / Strength:  UE ROM/Strength: Left Impaired/Limited, Right Impaired/Limited  RUE Impairment: Reduced strength (shoulder=grossly 3-/5; elbow/wrist=grossly 3+/5)  LUE Impairment: Reduced strength, Limited AROM (shoulder=PROM WFL, unable to break gravity; elbow/wrist=grossly 3/5)  LE ROM/Strength: Left WFL, Right WFL    Coordination:  Coordination: Not tested    Sensation:  RUE Sensation: RUE impaired  RUE Sensation Impairment: Numbness  LUE Sensation: LUE impaired  LUE Sensation Impairment: Numbness  RLE Sensation: RLE impaired  RLE Sensation Impairment: Numbness  LLE Sensation: LLE impaired  LLE Sensation Impairment: Numbness    Balance:  Static Sitting-Level of Assistance: Contact guard  Dynamic Sitting-Level of Assistance: Contact guard    Static Standing-Level of Assistance: Minimum assistance  Dynamic Standing - Level of Assistance: Moderate assistance    Functional Mobility  Transfers: Mod assist (sit<>stand=lifting assist, cues for hand placement and safety awareness 2/2 impulsivity)  Bed Mobility - Needs Assistance: Mod assist, +2 assist (supine>sit=mod assist, assist with trunk; sit>supine=mod assist +2, assist with BLE and to adjust trunk as pt impulsively laid back)  Ambulation: side stepping to left=mod assist, cues to keep RW on ground, for sequencing and safety    ADLs  ADLs: Needs assistance with ADLs  ADLs - Needs Assistance: Grooming, Toileting, UB dressing, LB dressing, Bathing  Grooming - Needs Assistance: Performed seated, Min assist (assist with hair care)  Bathing - Needs Assistance: Total Assist  Toileting - Needs Assistance: Total Assist  UB Dressing - Needs Assistance: Total Assist  LB Dressing - Needs Assistance: Total Assist         Patient at end of session: All needs in reach, Alarm activated, Friends/Family present, In bed, Lines intact, Notified Nurse     Occupational Therapy Session Duration  OT Individual [mins]: 7  OT Co-Treatment [mins]: 33 Baird Lyons PT)  Reason for Co-treatment: To safely progress mobility, Poor activity tolerance       AM-PAC-Daily Activity  Lower Body Dressing assistance needs: Unable to do/total assistance - Total Dependent Assist  Bathing assistance needs:  Unable to do/total assistance - Total Dependent Assist  Toileting assistance needs: Unable to do/total assistance - Total Dependent Assist  Upper Body Dressing assistance needs: Unable to do/total assistance - Total Dependent Assist  Personal Grooming assistance needs: A Little - Minimal/Contact Guard Assist/Supervision  Eating Meals assistance needs: A Little - Minimal/Contact Guard Assist/Supervision    Daily Activity Score: 10    Score (in points): % of Functional Impairment, Limitation, Restriction  6: 100% impaired, limited, restricted  7-8: At least 80%, but less than 100% impaired, limited restricted  9-13: At least 60%, but less than 80% impaired, limited restricted  14-19: At least 40%, but less than 60% impaired, limited restricted  20-22: At least 20%, but less than 40% impaired, limited restricted  23: At least 1%, but less than 20% impaired, limited restricted  24: 0% impaired, limited restricted      I attest that I have reviewed the above information.  Signed: Virgina Evener, OT  Ceasar Mons 02/28/2024

## 2024-02-28 NOTE — Unmapped (Signed)
 Operative Note  (CSN: [id number])    Service    Date of Surgery: 02/28/2024  Admit Date: 02/26/2024  Performing Service: General Surgery  Surgeons and Role:     * Renda Rolls, MD - Primary    Operative Note    Pre-op Diagnosis: SBO (small bowel obstruction) (CMS-HCC) [K56.609]    Post-op Diagnosis: SBO (small bowel obstruction) (CMS-HCC) [K56.609]    Procedures Performed: EXPLORATORY LAPAROTOMY, EXPLORATORY CELIOTOMY WITH OR WITHOUT BIOPSY(S)  REDUCTION OF PARASTOMAL HERNIA  SMALL BOWEL RESECTION  LYSIS OF ADHESIONS  Placement of temporary abdominal dressing    Findings:  Dilated loops of bowel with 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 The patient had an open abdomen The risks, benefits and alternatives to surgery were discussed with the patient and family, and informed consent was obtained.    Description of the Procedure: The patient was identified in the preoperative holding area and taken to the operative suite where they were placed on the table in the supine position. A pre-induction timeout was conducted with all team members present. General endotracheal anesthesia was administered and the patient was prepped and draped in the usual fashion. A pre-incisional timeout was then conducted and preoperative medications and equipment availability confirmed.    We performed a midline laparotomy down to the level of the fascia. We were able to enter the abdomen in the superior portion of the abdomen in a native plane. There was noted to be dense adhesions throughout the abdomen. During entry near her ileal conduit site and enterotomy was made due to the dense adhesions resulting in the spillage of succus. This was suctioned free and the enterotomy was controlled using a 3-0 vicryl suture. The remained of themidline was opened without issue. Patient was noted to have dense adhesions throughout her mid abdomen and pelvis with grossly dilated loops of bowel. Upon freeing up themajority of the adhesions, which took roughly 90 minutes, the transition point was noted to be secondary to thick adhesive disease from the midline to the ostomy site with a contained parastomal hernial. These adhesions were lysed sharply and the hernia was relieved. The remainder of the pelvis was carely dissected to free her bowel. 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 This was relieved sharply in order to assure there were no secondary transition points resulting in a 1 cm defect. The edges were sharply cleaned back to healthy bowel and this was repaired in 2 layers using 3-0 silk sutures. Once all the bowel was free we ran the bowel from the ligatament of treitz to the cecum and assured that there were no transition points left. Due to the extend of the enterotomy that was temporarily controlled, we elected to resect this region of bowel measuring roughly 5cm and perform a side by side functional end to end antiperistaltic anastamosis using a 80mm  blue load GIA stapler. The common channel was closed using a 80mm blue load GIA stapler. The staple line was oversewn and the anastamosis was healthy appearing and widely patient. The ileal conduit was assessed and noted to be viable. At this point due to concerns for multiple serosal injuries, need for a secondary assessment of the ileal conduit due to previous concerns of it being dusky, and need for secondary assessment of the primary repaired portion of the bowel we elected to leave her open for a second look to assure the bowel was all healthy prior to closure in this chronically ill patient. The abdomen was copiously irrigated and hemostasis was achieved. We placed an ABThera wound vac in the usual fashion on an incision that was greater than 50 square cm. It was connected to durable, non-disposable medical device (wound vac).      The patient remained intubated and was taken back to the ICU in stable condition.   Sponge and instrument counts were correct at the end of the case.     Complications:  two enterotomies were identified (one on entry and one from a fistula takedown) - both were immediately recognized and repaired.    Specimens:   ID Type Source Tests Collected by Time Destination   1 :  Tissue Small Bowel SURGICAL PATHOLOGY Jerilee Hoh, MD 02/28/2024 2028        Teaching Surgeon Attestation:  I was present for the entirety of the procedure(s).    Franchot Gallo, MD   Assistant Professor  Division of Trauma/Critical Care and Acute Care Surgery  Pager: [phone]      Date: 02/28/2024  Time: 8:43 PM

## 2024-02-28 NOTE — Unmapped (Signed)
 [facility] ARTERIAL LINE NOTE     DATE OF SERVICE: 02/28/2024     PROCEDURE:  ARTERIAL LINE      INDICATION:  Hemodynamic monitoring, frequent lab draws     CONSENT:   yes    DIAGNOSIS:  Hypotension    TIME OUT:   Yes, correct patient, side, site, procedure, position, equipment    POSITION:                 supine      SITE:                          left radial    ULTRASOUND:        Yes               ANESTHESIA:  1% buffered lidocaine    DESCRIPTION:          The patient was positioned appropriately. Allen's test performed to verify dual arterial blood supply. Operator masked and gloved in sterile fashion. Chlorhexidine used to sterilize the skin. Sterile drapes were applied to site. The artery was located with ultrasound and lidocaine was used to anesthetize the surrounding skin area. An arterial catheter was placed into the artery with pulsatile arterial blood return. Arterial waveform was transduced. The catheter was secured and a transparent CHG dressing was applied. Perfusion to the extremity was verified with adequate capillary refill.     COMPLICATIONS:  no complications were noted    BLOOD LOSS:  Minimal    POST-PROCEDURE DIAGNOSIS:  Same    ATTESTATION:  I performed the procedure     SIGNATURE:             Mindi Junker, MD      I was present for the entirety of the procedure(s). [name], MD

## 2024-02-28 NOTE — Unmapped (Signed)
 ADVANCE CARE PLANNING NOTE    Discussion Date:  February 28, 2024    Patient has decisional capacity:  Yes    Patient has selected a Health Care Decision-Maker if loses capacity: Yes    Health Care Decision Maker as of 02/28/2024    HCDM (patient stated preference): [name] - Daughter - [phone]    Discussion Participants:  Rhyleigh Grassel - patient   [name] - daughter  Dr. Oletha Blend, MD     Communication of Treatment Goals/Options:   Patient amenable to elective urgent v emergent intubation to secure airway in the setting of respiratory distress prior to likely OR tonight versus tomorrow morning. Prior to patient being placed on non-rebreather and induction, patient Aox4 and expressed to this provider that she wishes for her daughter, [name], to be her HCDM while she is intubated/sedated or in the case that she lacks capacity.             I spent 15 minutes providing voluntary advance care planning services for this patient.

## 2024-02-28 NOTE — Unmapped (Signed)
 CVAD Liaison - Insertion Note      The CVAD Liaison was contacted for the insertion of Central Venous Access Device (CVAD).  A chart review performed.   Indication: Medications requiring central line    Prior to the start of the procedure, a time out was performed and the identity of the patient was confirmed via name, medical record number and date of birth.  The sterile field was prepared with necessary supplies and equipment verified.  Insertion site was prepped with chlorhexidine and allowed to dry.  Maximum sterile techniques was utilized.    CVAD was inserted by Lauri MD.  Catheter was aspirated and flushed.  After line was placed and secured by provider, the insertion site cleansed and sterile dressing applied per manufacturer guidelines by CVAD Liaison.     The Central Line Checklist was referenced.  CVAD Liaison was present during entire procedure.  Report of the procedure given to the Primary Nurse.      Thank you for this consult,  Daisy Blossom, RN, CVAD Liaison     Consult Time 120 minutes

## 2024-02-28 NOTE — Unmapped (Signed)
 PHYSICAL THERAPY  Evaluation (02/28/24 1112)          Patient Name:  Tammy Braun       Medical Record Number: [id number]   Date of Birth: 1941/04/18  Sex: Female        Post-Discharge Physical Therapy Recommendations:  PT Post Acute Discharge Recommendations: 5x weekly, Low intensity   Equipment Recommendation  PT DME Recommendations: Defer to post acute          Treatment Diagnosis: Unsteadiness on feet, Difficulty in walking        Activity Tolerance: Tolerated treatment well     ASSESSMENT  Problem List: Decreased mobility, Decreased strength, Decreased endurance, Pain, Impaired sensation, Decreased safety awareness, Decreased range of motion, Gait deviation, Impaired balance, Fall risk      Assessment : Tammy Braun 83 y.o. female, PMH of bladder cancer s/p cystectomy, ileal conduit who presented to the ED with one day of nausea, emesis, abdominal distention. CT A/P showing SBO with transition point at ileal conduit. Labs with leukocytosis iso hemoconcentration, AKI on CKD, mildly elevated venous lactate improved with resuscitation.           Interval Events:  - 2/27: Admitted to floor status initially; upgraded to step down status for closer monitoring d/t increased O2 requirements  - 2/28: Afib w/RVR overnight s/p 5 mg metoprolol x 2 and 1L fluid bolus    Patient presents to acute PT services requiring moderate assistance with all functional mobility, able to take side steps along EOB.  Patient is limited in return to PLOF by decreased endurance, standing balance, strength and slight impulsivity  leading to decreased functional mobility.  Patient will benefit from additional skilled acute PT during admission, as well as post discharge recommendations 5x/weekly low in order to improve functional independence. After a review of the personal factors, comorbidities, clinical presentation, and examination of the number of affected body systems, the patient presents as a moderate complexity case. Today's Interventions: PT eval, bed mobility, transfers and ambulation at EOB. Pt educated re: PT POC and role, safety with mobility, call don't fall, therex reviewed/completed and written on board                  Clinical Decision Making: Moderate        PLAN  Planned Frequency of Treatment: Plan of Care Initiated: 02/28/24  1-2x per day Weekly Frequency: 3-4 days per week  Planned Treatment Duration: 03/13/24     Planned Interventions: Education (Patient/Family/Caregiver), Self-care / Home Management training, Gait training, Therapeutic Exercise, Therapeutic Activity     Goals:   Patient and Family Goals: 'to get to chair'     SHORT GOAL #1: pt will complete all bed mobility SBA               Time Frame : 2 weeks  SHORT GOAL #2: pt will perform all functional transfers with SBA and LRAD              Time Frame : 2 weeks  SHORT GOAL #3: pt will ambulate 71' with SBA and LRAD                                      Long Term Goal #1: pt will complete all functional mobility with mod I and LRAD  Time Frame: 3 weeks     Prognosis:  Fair  Positive Indicators: reported  PLOF, support  Barriers to Discharge: Functional strength deficits, Decreased range of motion, Inability to safely perform ADLS, Decreased safety awareness, Endurance deficits, Impaired Balance, Gait instability     SUBJECTIVE  Communication Preference: Verbal     Patient reports: pt agreeable to PT eval 'get me to my chair'  Pain Comments: pt denies pain at end of session        Prior Functional Status: pt reports ambulating with SPC , however at time uses RW, pt declines fall s in past 6 months. 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    Chronic kidney disease     Depression, psychotic (CMS-HCC)     Diabetes mellitus (CMS-HCC)     in past    Emphysema of lung (CMS-HCC)     Financial difficulties     Frail elderly     Hearing impairment     Hernia     History of transfusion     Hyperlipidemia     Hypertension     Impaired mobility     Osteoporosis     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/08/2015    Visual impairment             Social History     Tobacco Use    Smoking status: Every Day     Current packs/day: 1.00     Average packs/day: 1 pack/day for 65.9 years (65.9 ttl pk-yrs)     Types: Cigarettes     Start date: 04/09/1958    Smokeless tobacco: Never    Tobacco comments:     15cpd   Substance Use Topics    Alcohol use: No     Alcohol/week: 0.0 standard drinks of alcohol       Past Surgical History:   Procedure Laterality Date    ABDOMINAL SURGERY      BLADDER SURGERY      BREAST CYST EXCISION      CHEMOTHERAPY  2012    bladder    GALLBLADDER SURGERY      stone removal    ILEOSTOMY  2012    PR COLONOSCOPY FLX DX W/COLLJ SPEC WHEN PFRMD N/A 02/09/2015    Procedure: COLONOSCOPY, FLEXIBLE, PROXIMAL TO SPLENIC FLEXURE; DIAGNOSTIC, W/WO COLLECTION SPECIMEN BY BRUSH OR WASH;  Surgeon: Dewaine Conger, MD;  Location: [facility] [place];  Service: Gastroenterology    PR RECONSTR TOTAL SHOULDER IMPLANT Left 04/08/2018    Procedure: ARTHROPLASTY, GLENOHUMERAL JOINT; TOTAL SHOULDER(GLENOID & PROXIMAL HUMERAL REPLACEMENT(EG, TOTAL SHOULDER);  Surgeon: Tomasa Rand, MD;  Location: [facility] OR [facility];  Service: Ortho Sports Medicine    PR SIGMOIDOSCOPY,BIOPSY N/A 03/11/2015    Procedure: SIGMOIDOSCOPY, FLEXIBLE; WITH BIOPSY, SINGLE OR MULTIPLE;  Surgeon: Wilburt Finlay, MD;  [facility]: [facility];  Service: Gastroenterology    PR XCAPSL CTRC RMVL INSJ IO LENS PROSTH W/O ECP Left 06/05/2022    Procedure: EXTRACAPSULAR CATARACT REMOVAL W/INSERTION OF INTRAOCULAR LENS PROSTHESIS, MANUAL OR MECHANICAL TECHNIQUE WITHOUT ENDOSCOPIC CYCLOPHOTOCOAGULATION;  Surgeon: Garner Gavel, MD;  Location: [facility] OR [facility];  Service: Ophthalmology    PR XCAPSL CTRC RMVL INSJ IO LENS PROSTH W/O ECP Right 06/19/2022    Procedure: EXTRACAPSULAR CATARACT REMOVAL W/INSERTION OF INTRAOCULAR LENS PROSTHESIS, MANUAL OR MECHANICAL TECHNIQUE WITHOUT ENDOSCOPIC CYCLOPHOTOCOAGULATION;  Surgeon: Garner Gavel, MD;  Location: [facility] OR [facility];  Service: Ophthalmology             Family History   Problem Relation Age of Onset    Breast cancer Daughter 19    Diabetes Mother     Glaucoma Father     Colon cancer Neg Hx     Ovarian cancer Neg Hx     Endometrial cancer Neg Hx     Anesthesia problems Neg Hx     Bleeding Disorder Neg Hx         Allergies: Lisinopril, Losartan, and Hctz [hydrochlorothiazide]                  Objective Findings  Precautions / Restrictions  Precautions: Falls precautions  Weight Bearing Status: Non-applicable  Required Braces or Orthoses: Non-applicable        Equipment / Environment: Vascular access (PIV, TLC, Port-a-cath, PICC), NGT, Telemetry, Foley     Vitals/Orthostatics : HR in afib 130-140s with mobility , returned to 110-low 120s with return to bed , spO2 stable throughout > 93% on HFNS 45%/45L     Living Situation  Living Environment: House  Lives With: Daughter  Home Living: Ramped entrance, One level home  Caregiver Identified?: Yes  Caregiver Availability: 24 hours  Caregiver Ability: Limited lifting      Cognition: Follows 1-step commands  Cognition comment: pt responds appropriately slightly impulsive however follows commands  Orientation: Oriented x4     Hearing: No deficit identified     Skin Inspection: Intact where visualized     Upper Extremities  UE ROM: Right WFL, Left WFL  UE Strength: Right WFL, Left WFL    Lower Extremities  LE ROM: Right WFL, Left WFL  LE Strength: Right WFL, Left WFL  LE comment: gross LE weakness ~4/5, pt able to complete DF/PF and lift BLEs off bed, functional strength present no knee buckling          Coordination: WFL  Proprioception: Not tested  Sensation: Impaired, Numbness  Posture: Forward head, Kyphotic  Motor/Sensory/Neuro Comments: pt reports numbness all over at baseline    Static Sitting-Level of Assistance: Standby assistance  Dynamic Sitting-Level of Assistance: Contact guard  Sitting Balance comments: sitting at EOB    Static Standing-Level of Assistance: Minimum assistance  Dynamic Standing - Level of Assistance: Moderate assistance  Standing Balance comments: standing with RW, VC for safety and putting RW 4 points of ground in standing      Bed Mobility: Supine to Sit  Bed Mobility comments: supine > sit EOB with modA and HOB slightly elevated, sit > supine modAx2     Transfers: Sit to Stand  Sit to Stand assistance level: Moderate assist, patient does 50-74%  Transfer comments: STS From EOB with modA and RW, completed 2x during session, VC for hand placement on walker , pt slightly impulsive during transfers needing VC for redirection      Gait Level of Assistance: Moderate assist, patient does 50-74%  Gait Assistive Device: Rolling walker  Skilled Treatment Performed: pt completes 2 short bouts of side steps with modA and RW at EOB seated rest break between bouts dur to endurance deficits     Stairs: not  ready for            Endurance: decreased mobility, fatigues easily with OOB actiivty    Patient at end of session: All needs in reach, In bed, Notified Nurse, Lines intact, Friends/Family present    Physical Therapy Session Duration  PT Individual [mins]: 23 (overlap time spent with OT for hand off)  Reason for Co-treatment: To safely progress mobility, Poor activity tolerance          AM-PAC 5 click  Help currently need turning over In bed?: A Little - Minimal/Contact Guard Assist/Supervision  Help currently needed sitting down/standing up from chair with arms? : A lot - Maximum/Moderate Assistance  Help currently needed moving from supine to sitting on edge of bed?: A lot - Maximum/Moderate Assistance  Help currently needed moving to and from bed from wheelchair?: A lot - Maximum/Moderate Assistance  Help currently needed walking in a hospital room?: A lot - Maximum/Moderate Assistance      Basic Mobility Score 5 click: 11    Score (in points): % of Functional Impairment, Limitation, Restriction  5: 100% impaired, limited, restricted  6-7: At least 80%, but less than 100% impaired, limited restricted  8-11: At least 60%, but less than 80% impaired, limited restricted  12-16: At least 40%, but less than 60% impaired, limited restricted  17-18: At least 20%, but less than 40% impaired, limited restricted  19: At least 1%, but less than 20% impaired, limited restricted  20: 0% impaired, limited restricted    'AM-PAC' forms are Copyright protected by [employer] [facility]         I attest that I have reviewed the above information.  Signed: Estill Bamberg, PT  Ceasar Mons 02/28/2024

## 2024-02-28 NOTE — Unmapped (Addendum)
 [facility]UMA, ACUTE CARE, and GENERAL SURGERY   - Surgery Daily Progress Note -  02/28/2024     Admit Date: 02/26/2024, Hospital Day: 3  Hospital Service: [facility])  Attending: Margarite Gouge, MD  [facility]  General / Trauma Surgery - Trauma/CC     Assessment     Tammy Braun 83 y.o. female, PMH of bladder cancer s/p cystectomy, ileal conduit who presented to the ED with one day of nausea, emesis, abdominal distention. CT A/P showing SBO with transition point at ileal conduit. Labs with leukocytosis iso hemoconcentration, AKI on CKD, mildly elevated venous lactate improved with resuscitation.      Interval Events:  - 2/27: Admitted to floor status initially; upgraded to step down status for closer monitoring d/t increased O2 requirements  - 2/28: Afib w/RVR overnight s/p 5 mg metoprolol x 2 and 1L fluid bolus    Plan       Neuro:   *Hx of CVA  - Acute pain:Well controlled   scheduled IV tylenol and PRN IV dilaudid for breakthrough  - Flexeril PRN   - Holding home gabapentin     #Psychosocial:   *Hx of smoking   - Nicotine gym/lozenge PRN     CV:   *HTN, HLD  *Afib w/RVR  - HDS   - Continue home ASA, atorvastatin   - Consult Cardiology this am in setting of Afib with RVR overnight and hypotension; poor response to fluids and metoprolol (2/28); will follow-up final recommendations  - Restart home diltiazem  - Continue holding furosemide    - EKG on 2/26 showed: NSR w/ occasional PVC/PAC's  - Last ECHO done on 12/27/23 : EF estimated 55-60%    Resp:   *Hx of COPD  - NRB mask at 15 L initially; now requiring HFNC 46% sat 94%  - CXR 2/27: large right complex hernia w/ compressive atelectasis of right lung, no pleural effusion or pneumothorax   - CXR 2/28: appears stable compared to prior; awaiting final read  - ABG 7.36/43.2/72.9/24  - Continue pulmonary toliet: OOB and IS   - Continue home incruse ellipta  - Albuterol PRN     Fen/GI:   - Diet: NPO with sips of meds  - Fluids: MIVF at 100   - Zofran as needed for nausea   - Replete lytes prn: none needed today   - Bowel regimen: currently on hold iso of bowel obstruction     *SBO  - Continue NGT LIWS  - Anticipate OR today vs tomorrow for ex lap, hernia repair  - Urology notified for assistance in OR    *Hyperkalemia:   - Continue to monitor; downtrending appropriately    GU:   *CKD  *Hydronephrosis   - Borderline UOP.   - Cr: 2.63 (baseline Cr is 1.05) downtrending > 2.42  - Fena 1.9%   - Continue IVF; monitor UOP    *Ileal conduit   - Urology consulted (2/27); appreciate recommendations (below)  - Obtain and document strict I&Os  - No acute urologic intervention indicated  - Treat only for symptoms of urinary tract infection (I.e. foul smelling urine or changes in mental status)  - Continue to monitor Cr; can reconsider re-imaging based on trend    Heme  - Hb stable     ID:   - Afebrile.  - WBC- Leukocytosis downtrending to 11.4 from 18.5.  - RPP: negative     *UTI:   - Ceftriaxone started on 2/26  for total course of 5 days   - Urine culture pending     Endocrine:   *Hx of DM   - Holding home glipizide  - Lispro insulin q4h   - POCT q4h    PPx:   heparin due to AKI/CKD    Dispo: Stepdown status for increased O2 requirements   PT: pending  OT: pending  ST: not indicated  Barriers to discharge:  NGT with ILWS, diet advancement, urology recs, PT/OT recs when able   CM/SW assisting in discharge planning.     Contact: [facility] Stepdown [phone]  Judeen Hammans, PA      Subjective   No abdominal pain this morning    Objective     Vitals:   Temp:  [36.6 ??C (97.9 ??F)-37.6 ??C (99.7 ??F)] 36.9 ??C (98.5 ??F)  Heart Rate:  [85-141] 126  SpO2 Pulse:  [86-139] 129  Resp:  [18-30] 28  BP: (75-137)/(50-89) 98/73  MAP (mmHg):  [56-96] 76  FiO2 (%):  [35 %-80 %] 46 %  SpO2:  [92 %-99 %] 98 %    Intake/Output last 24 hours:  I/O last 3 completed shifts:  In: 2626.6 [I.V.:2000; NG/GT:10; IV Piggyback:616.7]  Out: 1320 [Urine:330; Emesis/NG output:90; Other:900]    Physical Exam:    -General:  Appropriate, holding a bucket with brown tinged emesis.  -Neurological: Alert and oriented x3. Moves all 4 extremities spontaneously.   -Psych: speech appropriate, pleasant affect  -HEENT:  NGT in place to LIWS  -Cardiovascular: Regular rate and rhythm.  -Pulmonary: Increased work of breathing, No accessory muscle use. Currently on HFNC at 46%  -Abdomen: Soft, non-distended. Non tender   -Genitourinary: Ileal conduit, stoma is pink and edematous, light yellow urine, clear  -Extremities: Warm, well perfused, normal skin turgor.      Data Review:    All lab results last 24 hours:    Recent Results (from the past 24 hours)   Basic Metabolic Panel    Collection Time: 02/27/24 12:08 PM   Result Value Ref Range    Sodium 138 135 - 145 mmol/L    Potassium 4.8 3.4 - 4.8 mmol/L    Chloride 95 (L) 98 - 107 mmol/L    CO2 26.0 20.0 - 31.0 mmol/L    Anion Gap 17 (H) 5 - 14 mmol/L    BUN 46 (H) 9 - 23 mg/dL    Creatinine 6.64 (H) 0.55 - 1.02 mg/dL    BUN/Creatinine Ratio 17     eGFR CKD-EPI (2021) Female 17 (L) >=60 mL/min/1.10m2    Glucose 161 (H) 70 - 99 mg/dL    Calcium 9.2 8.7 - 40.3 mg/dL   Magnesium Level    Collection Time: 02/27/24 12:08 PM   Result Value Ref Range    Magnesium 2.9 (H) 1.6 - 2.6 mg/dL   Phosphorus Level    Collection Time: 02/27/24 12:08 PM   Result Value Ref Range    Phosphorus 5.8 (H) 2.4 - 5.1 mg/dL   POCT Glucose    Collection Time: 02/27/24  3:18 PM   Result Value Ref Range    Glucose, POC 167 70 - 179 mg/dL   POCT Glucose    Collection Time: 02/27/24  5:57 PM   Result Value Ref Range    Glucose, POC 172 70 - 179 mg/dL   POCT Glucose    Collection Time: 02/27/24  9:10 PM   Result Value Ref Range    Glucose, POC 180 (H) 70 - 179 mg/dL  ECG 12 Lead    Collection Time: 02/27/24  9:23 PM   Result Value Ref Range    EKG Systolic BP  mmHg    EKG Diastolic BP  mmHg    EKG Ventricular Rate 137 BPM    EKG Atrial Rate 312 BPM    EKG P-R Interval  ms    EKG QRS Duration 68 ms    EKG Q-T Interval 310 ms    EKG QTC Calculation 468 ms    EKG Calculated P Axis  degrees    EKG Calculated R Axis 106 degrees    EKG Calculated T Axis 57 degrees    QTC Fredericia 408 ms   POCT Glucose    Collection Time: 02/28/24  1:42 AM   Result Value Ref Range    Glucose, POC 150 70 - 179 mg/dL   Blood Gas, Arterial Specify Site: Arterial; FIO2 Arterial: 55%    Collection Time: 02/28/24  3:45 AM   Result Value Ref Range    Specimen Source Arterial     FIO2 Arterial 55%     pH, Arterial 7.32 (L) 7.35 - 7.45    pO2, Arterial 41.3 (L) 80.0 - 110.0 mm Hg    pCO2, Arterial 49.8 (H) 35.0 - 45.0 mm Hg    HCO3 (Bicarbonate), Arterial 26 22 - 27 mmol/L    Base Excess, Arterial -0.9 -2.0 - 2.0    O2 Sat, Arterial 68.8 (LL) 94.0 - 100.0 %   CBC    Collection Time: 02/28/24  3:45 AM   Result Value Ref Range    WBC 14.2 (H) 3.6 - 11.2 10*9/L    RBC 4.23 3.95 - 5.13 10*12/L    HGB 12.0 11.3 - 14.9 g/dL    HCT 14.7 82.9 - 56.2 %    MCV 87.1 77.6 - 95.7 fL    MCH 28.3 25.9 - 32.4 pg    MCHC 32.5 32.0 - 36.0 g/dL    RDW 13.0 (H) 86.5 - 15.2 %    MPV 8.4 6.8 - 10.7 fL    Platelet 234 150 - 450 10*9/L   Basic Metabolic Panel    Collection Time: 02/28/24  3:45 AM   Result Value Ref Range    Sodium 139 135 - 145 mmol/L    Potassium 4.4 3.4 - 4.8 mmol/L    Chloride 100 98 - 107 mmol/L    CO2 25.0 20.0 - 31.0 mmol/L    Anion Gap 14 5 - 14 mmol/L    BUN 58 (H) 9 - 23 mg/dL    Creatinine 7.84 (H) 0.55 - 1.02 mg/dL    BUN/Creatinine Ratio 24     eGFR CKD-EPI (2021) Female 20 (L) >=60 mL/min/1.53m2    Glucose 155 70 - 179 mg/dL    Calcium 8.6 (L) 8.7 - 10.4 mg/dL   Magnesium Level    Collection Time: 02/28/24  3:45 AM   Result Value Ref Range    Magnesium 2.8 (H) 1.6 - 2.6 mg/dL   Phosphorus Level    Collection Time: 02/28/24  3:45 AM   Result Value Ref Range    Phosphorus 5.5 (H) 2.4 - 5.1 mg/dL   POCT Glucose    Collection Time: 02/28/24  5:20 AM   Result Value Ref Range    Glucose, POC 153 70 - 179 mg/dL   Blood Gas, Arterial Specify Site: Arterial; FIO2 Arterial: 55%    Collection Time: 02/28/24  5:35 AM   Result Value Ref Range  Specimen Source Arterial     FIO2 Arterial 55%     pH, Arterial 7.36 7.35 - 7.45    pO2, Arterial 72.9 (L) 80.0 - 110.0 mm Hg    pCO2, Arterial 43.2 35.0 - 45.0 mm Hg    HCO3 (Bicarbonate), Arterial 24 22 - 27 mmol/L    Base Excess, Arterial -1.2 -2.0 - 2.0    O2 Sat, Arterial 93.3 (L) 94.0 - 100.0 %   POCT Glucose    Collection Time: 02/28/24  9:27 AM   Result Value Ref Range    Glucose, POC 132 70 - 179 mg/dL       Imaging: Radiology studies were personally reviewed

## 2024-02-28 NOTE — Unmapped (Addendum)
 Pt increasingly drowsy and hypoxic around 1400; O2 requirement increased from 45% to 85% with minimal improvement. Lung sounds unchanged, pt oriented x 4. Provider and RT notified. Stat Xray obtained.    Intensivist team at bedside. Decision to intubate patient discussed with daughter at bedside.   Pt intubated around 1500. Central line and A-line placed. OR team at bedside and pt wheeled by OR personnel around 1800    Jewelry (5 rings) removed from patient and placed in a specimen cup with pt label.    Problem: Adult Inpatient Plan of Care  Goal: Plan of Care Review  Outcome: Progressing  Goal: Patient-Specific Goal (Individualized)  Outcome: Progressing  Goal: Absence of Hospital-Acquired Illness or Injury  Outcome: Progressing  Intervention: Identify and Manage Fall Risk  Recent Flowsheet Documentation  Taken 02/28/2024 0800 by Jovita Kussmaul, RN  Safety Interventions:   aspiration precautions   low bed   lighting adjusted for tasks/safety   nonskid shoes/slippers when out of bed  Intervention: Prevent Skin Injury  Recent Flowsheet Documentation  Taken 02/28/2024 1200 by Jovita Kussmaul, RN  Positioning for Skin: Left  Taken 02/28/2024 1000 by Jovita Kussmaul, RN  Positioning for Skin: Right  Taken 02/28/2024 0800 by Jovita Kussmaul, RN  Positioning for Skin: Left  Device Skin Pressure Protection:   absorbent pad utilized/changed   tubing/devices free from skin contact  Skin Protection:   adhesive use limited   incontinence pads utilized   tubing/devices free from skin contact   transparent dressing maintained  Intervention: Prevent and Manage VTE (Venous Thromboembolism) Risk  Recent Flowsheet Documentation  Taken 02/28/2024 1200 by Jovita Kussmaul, RN  Anti-Embolism Device Type: SCD, Knee  Anti-Embolism Device Status: Refused  Anti-Embolism Device Location: BLE  Taken 02/28/2024 1000 by Jovita Kussmaul, RN  Anti-Embolism Device Type: SCD, Knee  Anti-Embolism Device Status: Refused  Anti-Embolism Device Location: BLE  Taken 02/28/2024 0800 by Jovita Kussmaul, RN  Anti-Embolism Device Type: SCD, Knee  Anti-Embolism Device Status: On  Anti-Embolism Device Location: BLE  Goal: Optimal Comfort and Wellbeing  Outcome: Progressing  Goal: Readiness for Transition of Care  Outcome: Progressing  Goal: Rounds/Family Conference  Outcome: Progressing     Problem: Infection  Goal: Absence of Infection Signs and Symptoms  Outcome: Progressing     Problem: Fall Injury Risk  Goal: Absence of Fall and Fall-Related Injury  Outcome: Progressing  Intervention: Promote Injury-Free Environment  Recent Flowsheet Documentation  Taken 02/28/2024 0800 by Jovita Kussmaul, RN  Safety Interventions:   aspiration precautions   low bed   lighting adjusted for tasks/safety   nonskid shoes/slippers when out of bed     Problem: Wound  Goal: Optimal Coping  Outcome: Progressing  Goal: Optimal Functional Ability  Outcome: Progressing  Goal: Absence of Infection Signs and Symptoms  Outcome: Progressing  Goal: Improved Oral Intake  Outcome: Progressing  Goal: Optimal Pain Control and Function  Outcome: Progressing  Goal: Skin Health and Integrity  Outcome: Progressing  Intervention: Optimize Skin Protection  Recent Flowsheet Documentation  Taken 02/28/2024 1200 by Jovita Kussmaul, RN  Head of Bed [facility]) Positioning: HOB at 45 degrees  Taken 02/28/2024 0800 by Jovita Kussmaul, RN  Pressure Reduction Techniques:   heels elevated off bed   pressure points protected  Head of Bed (HOB) Positioning: HOB at 30 degrees  Pressure Reduction Devices: pressure-redistributing mattress utilized  Skin Protection:   adhesive use limited   incontinence pads utilized   tubing/devices free from skin contact   transparent dressing maintained  Goal: Optimal Wound Healing  Outcome: Progressing     Problem: Skin Injury Risk Increased  Goal: Skin Health and Integrity  Outcome: Progressing  Intervention: Optimize Skin Protection  Recent Flowsheet Documentation  Taken 02/28/2024 1200 by Jovita Kussmaul, RN  Head of Bed [facility]) Positioning: HOB at 45 degrees  Taken 02/28/2024 0800 by Jovita Kussmaul, RN  Pressure Reduction Techniques:   heels elevated off bed   pressure points protected  Head of Bed (HOB) Positioning: HOB at 30 degrees  Pressure Reduction Devices: pressure-redistributing mattress utilized  Skin Protection:   adhesive use limited   incontinence pads utilized   tubing/devices free from skin contact   transparent dressing maintained     Problem: Self-Care Deficit  Goal: Improved Ability to Complete Activities of Daily Living  Outcome: Progressing     Problem: Non-Violent Restraints  Goal: Patient will remain free of restraint events  Outcome: Progressing  Goal: Patient will remain free of physical injury  Outcome: Progressing

## 2024-02-28 NOTE — Unmapped (Signed)
 Pt HR going into a-flutter and had a high HR. MD notified and gave bolus and then metoprolol. Pt had the same occurrence, MD notified and gave another dose of metoprolol. NGT had small output and is still hooked up to low intermediate suction. Was able to flush NGT. Pt remains on heated high flow. Pt remains on intermediate status.       Problem: Adult Inpatient Plan of Care  Goal: Plan of Care Review  Outcome: Progressing  Goal: Patient-Specific Goal (Individualized)  Outcome: Progressing  Goal: Absence of Hospital-Acquired Illness or Injury  Outcome: Progressing  Intervention: Identify and Manage Fall Risk  Recent Flowsheet Documentation  Taken 02/27/2024 2000 by Merian Capron, RN  Safety Interventions:   aspiration precautions   lighting adjusted for tasks/safety   low bed   room near unit station  Intervention: Prevent Skin Injury  Recent Flowsheet Documentation  Taken 02/28/2024 0600 by Merian Capron, RN  Positioning for Skin: Right  Taken 02/28/2024 0400 by Merian Capron, RN  Positioning for Skin: Left  Taken 02/28/2024 0200 by Merian Capron, RN  Positioning for Skin: Right  Taken 02/28/2024 0000 by Merian Capron, RN  Positioning for Skin: Left  Taken 02/27/2024 2200 by Merian Capron, RN  Positioning for Skin: Right  Taken 02/27/2024 2000 by Merian Capron, RN  Positioning for Skin: Left  Device Skin Pressure Protection:   absorbent pad utilized/changed   tubing/devices free from skin contact  Skin Protection:   adhesive use limited   tubing/devices free from skin contact   incontinence pads utilized  Intervention: Prevent and Manage VTE (Venous Thromboembolism) Risk  Recent Flowsheet Documentation  Taken 02/28/2024 0600 by Merian Capron, RN  Anti-Embolism Device Type: SCD, Knee  Anti-Embolism Device Status: On  Anti-Embolism Device Location: BLE  Taken 02/28/2024 0400 by Merian Capron, RN  Anti-Embolism Device Type: SCD, Knee  Anti-Embolism Device Status: On  Anti-Embolism Device Location: BLE  Taken 02/28/2024 0200 by Merian Capron, RN  Anti-Embolism Device Type: SCD, Knee  Anti-Embolism Device Status: On  Anti-Embolism Device Location: BLE  Taken 02/28/2024 0000 by Merian Capron, RN  Anti-Embolism Device Type: SCD, Knee  Anti-Embolism Device Status: On  Anti-Embolism Device Location: BLE  Taken 02/27/2024 2200 by Merian Capron, RN  Anti-Embolism Device Type: SCD, Knee  Anti-Embolism Device Status: On  Anti-Embolism Device Location: BLE  Taken 02/27/2024 2000 by Merian Capron, RN  Anti-Embolism Device Type: SCD, Knee  Anti-Embolism Device Status: On  Anti-Embolism Device Location: BLE  Goal: Optimal Comfort and Wellbeing  Outcome: Progressing  Goal: Readiness for Transition of Care  Outcome: Progressing  Goal: Rounds/Family Conference  Outcome: Progressing     Problem: Infection  Goal: Absence of Infection Signs and Symptoms  Outcome: Progressing     Problem: Fall Injury Risk  Goal: Absence of Fall and Fall-Related Injury  Outcome: Progressing  Intervention: Promote Injury-Free Environment  Recent Flowsheet Documentation  Taken 02/27/2024 2000 by Merian Capron, RN  Safety Interventions:   aspiration precautions   lighting adjusted for tasks/safety   low bed   room near unit station     Problem: Wound  Goal: Optimal Coping  Outcome: Progressing  Goal: Optimal Functional Ability  Outcome: Progressing  Intervention: Optimize Functional Ability  Recent Flowsheet Documentation  Taken 02/28/2024 0600 by Merian Capron, RN  Activity Management: bedrest  Taken 02/28/2024 0400 by Merian Capron, RN  Activity Management: bedrest  Taken 02/28/2024 0200 by  Merian Capron, RN  Activity Management: bedrest  Taken 02/28/2024 0000 by Merian Capron, RN  Activity Management: bedrest  Taken 02/27/2024 2200 by Merian Capron, RN  Activity Management: bedrest  Taken 02/27/2024 2000 by Merian Capron, RN  Activity Management: bedrest  Goal: Absence of Infection Signs and Symptoms  Outcome: Progressing  Goal: Improved Oral Intake  Outcome: Progressing  Goal: Optimal Pain Control and Function  Outcome: Progressing  Goal: Skin Health and Integrity  Outcome: Progressing  Intervention: Optimize Skin Protection  Recent Flowsheet Documentation  Taken 02/28/2024 0600 by Merian Capron, RN  Activity Management: bedrest  Head of Bed [facility]) Positioning: HOB at 30-45 degrees  Taken 02/28/2024 0400 by Merian Capron, RN  Activity Management: bedrest  Head of Bed [facility]) Positioning: HOB at 30-45 degrees  Taken 02/28/2024 0200 by Merian Capron, RN  Activity Management: bedrest  Head of Bed [facility]) Positioning: HOB at 30 degrees  Taken 02/28/2024 0000 by Merian Capron, RN  Activity Management: bedrest  Head of Bed [facility]) Positioning: HOB at 30 degrees  Taken 02/27/2024 2200 by Merian Capron, RN  Activity Management: bedrest  Head of Bed [facility]) Positioning: HOB at 30-45 degrees  Taken 02/27/2024 2000 by Merian Capron, RN  Activity Management: bedrest  Pressure Reduction Techniques: weight shift assistance provided  Head of Bed (HOB) Positioning: HOB at 30-45 degrees  Pressure Reduction Devices: pressure-redistributing mattress utilized  Skin Protection:   adhesive use limited   tubing/devices free from skin contact   incontinence pads utilized  Goal: Optimal Wound Healing  Outcome: Progressing     Problem: Skin Injury Risk Increased  Goal: Skin Health and Integrity  Outcome: Progressing  Intervention: Optimize Skin Protection  Recent Flowsheet Documentation  Taken 02/28/2024 0600 by Merian Capron, RN  Activity Management: bedrest  Head of Bed [facility]) Positioning: HOB at 30-45 degrees  Taken 02/28/2024 0400 by Merian Capron, RN  Activity Management: bedrest  Head of Bed [facility]) Positioning: HOB at 30-45 degrees  Taken 02/28/2024 0200 by Merian Capron, RN  Activity Management: bedrest  Head of Bed [facility]) Positioning: HOB at 30 degrees  Taken 02/28/2024 0000 by Merian Capron, RN  Activity Management: bedrest  Head of Bed [facility]) Positioning: HOB at 30 degrees  Taken 02/27/2024 2200 by Merian Capron, RN  Activity Management: bedrest  Head of Bed [facility]) Positioning: HOB at 30-45 degrees  Taken 02/27/2024 2000 by Merian Capron, RN  Activity Management: bedrest  Pressure Reduction Techniques: weight shift assistance provided  Head of Bed (HOB) Positioning: HOB at 30-45 degrees  Pressure Reduction Devices: pressure-redistributing mattress utilized  Skin Protection:   adhesive use limited   tubing/devices free from skin contact   incontinence pads utilized     Problem: Self-Care Deficit  Goal: Improved Ability to Complete Activities of Daily Living  Outcome: Progressing

## 2024-02-28 NOTE — Unmapped (Signed)
 DIVISION OF CARDIOLOGY  [employer], [place]        Date of Service: 02/28/24      CARDIOLOGY INITIAL CONSULT  NOTE    Requesting Physician: Margarite Gouge, MD   Requesting Service: Peter Garter [facility])   Consulting Fellow: Judd Lien, MD     Ms. Yoneko Talerico is a 83 y.o. female with hx of bladder cancer with cystectomy and ileal conduit , COPD , recent dx of afib, moderate to severe TR , coronary artery calcifications who presented to [facility] with projectile vomiting and abdominal pain, found to have SBO with transition point at ileostomy. Cardiology consulted for afib with RVR.     Assessment/Plan:    Afib/flutter with RVR  Patient has a prior history of paroxysmal afib that flares during acute illnesses or hospitalization per daughter, though first report of afib in her chart is from December 2024 admission. She has innumerable acute stressors that could have provoked her to flip into afib and RVR. Heart rates mostly sustaining between 120s-140s which are unlikely to cause hemodynamic significance. Overall we would prioritize treating SBO, respiratory failure , AKI and it is very likely rvr will improve or she will convert back into NSR. If she has RVR sustaining > 150 would advocate for going directly to IV amio as she may not tolerate av nodal blockers with her BP.   - continue treating underlying stressors as you are   - if RVR sustaining > 150 would put patient on IV amiodarone . At this time I suspect anything given PO will have poor absorption   - reasonable to obtain repeat baseline TTE in our system   - CHADSvasc = 6 and pt not anticoagulated at baseline (per chart review daughter has declined previously). This can be revisited in outpatient setting     Moderate to Severe TR  Volume overload   Daughter reports weight gain and worsening LE edema sub acutely to chronically. On exam she has JVD to ear though this is confounded by significant TR. Her volume overload is likely to be worsening her afib/flutter, though given acute abdomen and AKI we would not pursue aggressive diuresis at this time unless she has worsening respiratory failure. If respiratory status remains stable/improving we recommend working on diuresis post op   - would avoid large volume resuscitations perioperative  - hold diuresis unless worsening respiratory status   - please obtain pro bnp         Diagnoses addressed on this consultation:  Tricuspid Regurgitation, with severe exacerbation. Paroxysmal Atrial Fibrillation, with severe exacerbation. Stable Coronary Artery Disease, stable.       I discussed the plan with the primary team via phone    Thank you for this consult. This patient was seen and discussed with the consult attending. Please see attending attestation for further insights into management. If any further questions arise please page the cardiology consult pager [phone]) Monday - Friday from 8AM-5PM or the on-call cardiology pager [phone]) nights and weekends.     This note was generated using speech recognition software and may contain homophonic word substitutions or errors.    Subjective:   83 y/o F hx of bladder cancer with cystectomy and ileal conduit , COPD , recent dx of afib, moderate to severe TR , coronary artery calcifications who presented to [facility] with projectile vomiting and abdominal pain, found to have SBO with transition point at ileostomy.    On our assessment, patient is confused  and not able to provide much significant history. She endorses abdominal pain and shortness of breath, but unable to provide much additional history.     Her daughter is present at bedside and able to provide additional history. Daughter reports anytime she gets admitted to the hospital or gets sick, she has heart issues and she has been told about atrial fibrillation in the past.     Daughter also reports she thinks patient has been more swollen and puffy lately. This has been going on for several weeks to a few months. She says her legs have been puffy and abdomen is swollen.     Cardiovascular History:  HTN  Tobacco use disorder  Moderate to severe TR   Coronary artery disease   T2DM     Pertinent Medications:  Dilt 120 daily   Lasix 20mg  daily   Aspirin 81mg    Atorvastatin 40mg  daily          Objective:    BP 103/66  - Pulse 133  - Temp 36.9 ??C (98.5 ??F) (Oral)  - Resp 20  - SpO2 98%        General: awake and Alert, confused , family at bedside   HEENT: Overall benign  Cardiac: normal rate, regular rhythm, no murmurs rubs or gallops. JVP to ear . Radial pulses 2+ b/l  Pulmonary: rhoncorous breath sounds on anterior fields,  no increased work of breathing on HFNC   Abdomen: RLQ stoma, abdomen midly firm and tender   Extremities: 1+ BLE edema . Legs are warm  Neuro: Alert but confused     I have reviewed Pertinent Labs, including creatinine 2.4 (down from 2.7), recent hstrops normal        I have independently interpreted Most recent ECG, notable for afib with RVR with non specific inferior and lateral ST - T wave changes . and Most recent CXR, notable for right hemidiaphragm elevation/hiatal hernia in R hemithorax .    I have recommended the primary team order pertinent cardiac tests as detailed above in the assessment and plan.

## 2024-02-28 NOTE — Unmapped (Signed)
 [facility] CONSULT NOTE     Date of Service: 02/28/2024    Hospital Day: LOS: 1 day        Surgery Date: TBD   Surgical Attending: [name], MD    Critical Care Attending: Robina Ade, MD    Interval History:   Patient with increased O2 requirement through the morning of 02/28/24. Initially LBNC at FiO2 46% earlier in the morning with increase in requirement to 90% to keep O2 sats within goal. RT notified, albuterol neb administered without improvement to respiratory status. CXR obtained with evidence of large hiatal hernia and atelectasis. Had remained tachycardic to 120-130's, afib rhythm paroxysmally. Cardiology consulted earlier today with recommendation of continuing to treat underlying stressors and initiation of IV Amio if Hr sustained >150. On reassessment by primary team this afternoon, patient with desaturation to 80's, intermittently somnolent. Patient upgraded to ICU status. Patient, daughter and this provider discussed and confirmed that daughter [name]) will be HCDM. Patient amenable to planned intubation to secure airway in setting of respiratory distress.     Trauma surgery team aware, attending notified. Posted as Class C for OR. Urology at bedside who placed catheter in ileal conduit and to be available for case during OR.     History of Present Illness:   Tamee Battin is a 83 y.o. female with PMH bladder cancer with cystectomy and ileal conduit, COPD, recent dx of afib, moderate-severe TR, CAD (high calcification score), T2DM presented to [facility] with projectile vomiting and abdominal pain, found to have SBO with transition point at level of ileostomy on CT a/p. On initial presentation, labs remarkable for leukocytosis thought to be iso hemoconcentration, AKI on CKD, mildly elevated venous lactate improved with resuscitation.     Hospital Course:  - 02/26/24: Admitted to [facility], floor status, consult to urology  - 02/27/24: Patient level of care escalated to step-down iso increased oxygen needs while in ED   - 02/28/24: Upgraded to ICU. Intubated at bed-side. Posted for OR.     Active Problems:    * No active hospital problems. *      Review of Systems    A 12 system review of systems was negative except as noted in HPI    Allergies:    Lisinopril, Losartan, and Hctz [hydrochlorothiazide]      Medications:       Current Facility-Administered Medications   Medication Dose Route Frequency Provider Last Rate Last Admin    acetaminophen (OFIRMEV) 10 mg/mL injection 650 mg 65 mL  650 mg Intravenous Q6H PRN Romie Jumper, FNP   Stopped at 02/28/24 0958    albuterol 2.5 mg /3 mL (0.083 %) nebulizer solution 2.5 mg  2.5 mg Nebulization Q6H PRN Romie Jumper, FNP   2.5 mg at 02/28/24 1322    [Provider Hold] aspirin chewable tablet 81 mg  81 mg Oral Daily Concha Pyo, MD PhD   81 mg at 02/28/24 0912    [Provider Hold] atorvastatin (LIPITOR) tablet 40 mg  40 mg Oral Daily Concha Pyo, MD PhD   40 mg at 02/28/24 0912    cefTRIAXone (ROCEPHIN) 1 g in sodium chloride 0.9 % (NS) 100 mL IVPB-MBP  1 g Intravenous Q24H Concha Pyo, MD PhD   Stopped at 02/28/24 0545    [Provider Hold] cyclobenzaprine (FLEXERIL) tablet 5 mg  5 mg Oral BID PRN Concha Pyo, MD PhD        dextrose (D10W) 10% bolus 125 mL  12.5 g Intravenous Q10 Min PRN Concha Pyo, MD PhD        [Provider Hold] dilTIAZem [facility] CD) 24 hr capsule 120 mg  120 mg Oral Daily Newt Minion [place], [place]   120 mg at 02/28/24 0912    fentaNYL (PF) (SUBLIMAZE) injection 25 mcg  25 mcg Intravenous Q30 Min PRN Drema Balzarine S, [place]        Or    fentaNYL (PF) (SUBLIMAZE) injection 50 mcg  50 mcg Intravenous Q30 Min PRN Maryann Alar, [place]   50 mcg at 02/28/24 1551    fentaNYL PF (SUBLIMAZE) (50 mcg/mL) infusion (bag)  0-200 mcg/hr Intravenous Continuous Maryann Alar, PA 1 mL/hr at 02/28/24 1600 50 mcg/hr at 02/28/24 1600    [Provider Hold] fluPHENAZine decanoate (PROLIXIN) injection 50 mg  50 mg Intramuscular Q30 Days Concha Pyo, MD PhD        [Provider Hold] furosemide (LASIX) tablet 20 mg  20 mg Oral Daily Concha Pyo, MD PhD        [Provider Hold] gabapentin (NEURONTIN) capsule 100 mg  100 mg Oral TID Concha Pyo, MD PhD        glucagon injection 1 mg  1 mg Intramuscular Once PRN Concha Pyo, MD PhD        heparin (porcine) 5,000 unit/mL injection 5,000 Units  5,000 Units Subcutaneous Q8H [facility] Concha Pyo, MD PhD   5,000 Units at 02/28/24 [phone]    HYDROmorphone (PF) (DILAUDID) injection Syrg 0.2 mg  0.2 mg Intravenous Q2H PRN Concha Pyo, MD PhD        insulin lispro (HumaLOG) injection 0-20 Units  0-20 Units Subcutaneous Q4H [facility] Concha Pyo, MD PhD        metroNIDAZOLE (FLAGYL) IVPB 500 mg  500 mg Intravenous Q8H Golda Acre, MD/DMD   Stopped at 02/28/24 [phone]    nicotine polacrilex (NICORETTE) gum 4 mg  4 mg Buccal Q1H PRN Concha Pyo, MD PhD        Or    nicotine polacrilex (NICORETTE) lozenge 4 mg  4 mg Buccal Q1H PRN Concha Pyo, MD PhD        NORepinephrine 8 mg in dextrose 5 % 250 mL (32 mcg/mL) infusion PMB  0-30 mcg/min Intravenous Continuous Maryann Alar, PA   Paused at 02/28/24 1535    ondansetron (ZOFRAN) injection 4 mg  4 mg Intravenous Q6H PRN Adele Dan Hobgood, AGNP        propofol (DIPRIVAN) infusion 10 mg/mL  0-80 mcg/kg/min Intravenous Continuous Maryann Alar, PA 11 mL/hr at 02/28/24 1600 30 mcg/kg/min at 02/28/24 1600    umeclidinium (INCRUSE ELLIPTA) 62.5 mcg/actuation inhaler 1 puff  1 puff Inhalation Daily (RT) Concha Pyo, MD PhD   1 puff at 02/28/24 1236     Facility-Administered Medications Ordered in Other Encounters   Medication Dose Route Frequency Provider Last Rate Last Admin    EPINEPHrine 100 mcg/10 mL (10 mcg/mL) injection   Intravenous PRN (once a day) Ichite, Precious C, MD   10 mcg at 02/28/24 1511    Propofol (DIPRIVAN) injection   Intravenous PRN (once a day) Ichite, Precious C, MD   60 mg at 02/28/24 1507    succinylcholine (ANECTINE) injection   Intravenous PRN (once a day) Ichite, Precious C, MD   60 mg at 02/28/24 1507         Past Medical History    Past Medical History:  Diagnosis Date    Anxiety     Arthritis     At risk for falls     Breast cyst     Cancer (CMS-HCC)     bladder    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    Chronic kidney disease     Depression, psychotic (CMS-HCC)     Diabetes mellitus (CMS-HCC)     in past    Emphysema of lung (CMS-HCC)     Financial difficulties     Frail elderly     Hearing impairment     Hernia     History of transfusion     Hyperlipidemia     Hypertension     Impaired mobility     Osteoporosis     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/08/2015    Visual impairment          Past Surgical History    Past Surgical History:   Procedure Laterality Date    ABDOMINAL SURGERY      BLADDER SURGERY      BREAST CYST EXCISION      CHEMOTHERAPY  2012    bladder    GALLBLADDER SURGERY      stone removal    ILEOSTOMY  2012    PR COLONOSCOPY FLX DX W/COLLJ SPEC WHEN PFRMD N/A 02/09/2015    Procedure: COLONOSCOPY, FLEXIBLE, PROXIMAL TO SPLENIC FLEXURE; DIAGNOSTIC, W/WO COLLECTION SPECIMEN BY BRUSH OR WASH;  Surgeon: Dewaine Conger, MD;  Location: [facility] [place];  Service: Gastroenterology    PR RECONSTR TOTAL SHOULDER IMPLANT Left 04/08/2018    Procedure: ARTHROPLASTY, GLENOHUMERAL JOINT; TOTAL SHOULDER(GLENOID & PROXIMAL HUMERAL REPLACEMENT(EG, TOTAL SHOULDER);  Surgeon: Tomasa Rand, MD;  Location: [facility] OR [facility];  Service: Ortho Sports Medicine    PR SIGMOIDOSCOPY,BIOPSY N/A 03/11/2015    Procedure: SIGMOIDOSCOPY, FLEXIBLE; WITH BIOPSY, SINGLE OR MULTIPLE;  Surgeon: Wilburt Finlay, MD;  Location: [facility];  Service: Gastroenterology    PR XCAPSL CTRC RMVL INSJ IO LENS PROSTH W/O ECP Left 06/05/2022    Procedure: EXTRACAPSULAR CATARACT REMOVAL W/INSERTION OF INTRAOCULAR LENS PROSTHESIS, MANUAL OR MECHANICAL TECHNIQUE WITHOUT ENDOSCOPIC CYCLOPHOTOCOAGULATION;  Surgeon: Garner Gavel, MD;  Location: [facility] OR [facility];  Service: Ophthalmology    PR XCAPSL CTRC RMVL INSJ IO LENS PROSTH W/O ECP Right 06/19/2022    Procedure: EXTRACAPSULAR CATARACT REMOVAL W/INSERTION OF INTRAOCULAR LENS PROSTHESIS, MANUAL OR MECHANICAL TECHNIQUE WITHOUT ENDOSCOPIC CYCLOPHOTOCOAGULATION;  Surgeon: Garner Gavel, MD;  Location: [facility] OR [facility];  Service: Ophthalmology         Family History    The patient's family history includes Breast cancer (age of onset: 64) in her daughter; Diabetes in her mother; Glaucoma in her father..      Social History:    Tobacco use: smoker, 1 ppd  Alcohol use: denies  Drug use: denies     ASSESSMENT & PLAN:     Neurologic:  - Pain: PRN IV Tylenol, fent gtt, fent prn, HM prn   - Sedation: propofol gtt     HEENT:  NAI    Cardiovascular:  #HTN, HLD:   Chronic. Hemodynamically tenuous requiring low dose vasopressor at this time.   - norepi gtt     #Afib/flutter w RVR:   Prior hx of paroxysmal afib, flares iso acute illnesses or hospitalization per dauther. First report of afib in chart from Dec 2024 admission. Sustaining 120-140's. Will tx underlying stressor of SBO w OR this evening. CHADSvasc = 6,  not anticoagulated at baseline per chart review.   - IV amio if HR > 150   - holding home diltiazem, question efficacy given likely poor absorption   - TTE: pending  - Pro-BNP: pending      Respiratory:  #Acute on chronic hypoxemia, hx of COPD:   - intubated, sedated 02/28/24 urgently   - CXR 02/26/27: large R complex hernia w/ compressive atelectasis of R lung, no pleural effusion of ptx  - repeat CXR post intubation: pending   - continue pulmonary toilet  - home incruse ellipta   - PRN albuterol       FEN/GI:  F: medlocked, caution with perioperative fluid bolus   E: Replete electrolytes PRN  N: NPO  - GI prophylaxis: zofran prn for nausea; IV protnix   - Bowel regimen: held iso bowel obstruction Renal/Genitourinary:  #AKI on CKD:   BL Cr 1.05, elevated to 2.7 on admission, downtrending to 2.4.    #hydronephrosis:   - Fena 1.9%  - caution with perioperative fluid resuscitation per Cards   - consideration for c/s to nephrology iso hyperK, CKD     #ileal conduit:   - SRU consulted, following   - foley placed into conduit today     Endocrine:   - Glycemic Control: SSI    Hematologic:   - Monitor CBC daily, transfuse products as indicated    Immunologic/Infectious Disease:  - Afebrile, leukocytosis 14.2 from  11.4, continue to monitor  - Cultures: Urine clx 02/26/24 mixed GP/GN organisms    - RPP 02/27/24: negative     Musculoskeletal:   NAI    Daily Care Checklist:   - Stress Ulcer Prevention: Yes: Mechanically ventilated  - DVT Prophylaxis: Chemical: SQ Heparin  - Daily Awakening: Yes  - Spontaneous Breathing Trial:  post-op  - Indication for Central/PICC Line: Yes  Infusions requiring central access, Hemodynamic monitoring, and Inadequate peripheral access  - Indication for Urinary Catheter: Yes  Strict intake and output, Agressive diuresis/hydration, and Urinary retention/obstruction  - Diagnostic images/reports of past 24hrs reviewed: Yes    Disposition:   - Continue ICU care  - PT/OT consulted:  will consult post OR    Alexander Mt, MD   Surgical Intensive Care Unit  [facility] [place] [facility] [place]       SUBJECTIVE:      Patient laying in bed. Arousable. Answering questions appropriately with repeat prompting. Endorsing SOB and difficult with taking adequate vital capacities. Discussed risks/benefits/alternatives to intubation. Endorsed understanding. Daughter involved in conversations at bed-side.      OBJECTIVE:     Physical Exam:  Constitutional: laying in bed, moderate distress. Intermittently somnolent.   Neurologic: Aox4 with repeat prompting. Does endorse somnolence. Appears hard of hearing in conversation.  HEENT: HFNC in place at time of evaluation. JVD to level of mandible apprecited.   Respiratory: Visibly increased WOB, accessory muscle use. Endorsing SOB. Diminished sounds to LLL.   Cardiovascular: tachycardic, irregular rhythm, mild edema to LE   Gastrointestinal: distended abdomen, moderate TTP, large hernia   Musculoskeletal: ambulating upper lower extremity without issue   Skin: no superficial abrasions, cuts, lesions on clothed exam       Temp:  [36.6 ??C (97.9 ??F)-37.6 ??C (99.7 ??F)] 36.9 ??C (98.5 ??F)  Heart Rate:  [85-141] 123  SpO2 Pulse:  [86-139] 125  Resp:  [15-30] 17  BP: (72-170)/(39-101) 98/51  MAP (mmHg):  [45-120] 66  FiO2 (%):  [35 %-100 %] 80 %  SpO2:  [90 %-100 %] 100 %       Recent Laboratory Results:  Recent Labs     Units 02/28/24  0535   PHART  7.36   PCO2ART mm Hg 43.2   PO2ART mm Hg 72.9*   HCO3ART mmol/L 24   BEART  -1.2   O2SATART % 93.3*     Recent Labs     Units 02/27/24  0524 02/27/24  0950 02/27/24  1208 02/28/24  0345   NA mmol/L 138 141 138 139   K mmol/L 5.3* 4.5 4.8 4.4   CL mmol/L 94*  --  95* 100   CO2 mmol/L 30.0  --  26.0 25.0   BUN mg/dL 41*  --  46* 58*   CREATININE mg/dL 1.61*  --  0.96* 0.45*   GLU mg/dL 409*  --  811* 914     Lab Results   Component Value Date    BILITOT 0.5 02/26/2024    BILITOT 0.3 01/06/2024    ALT 20 02/26/2024    ALT 12 01/06/2024    AST 31 02/26/2024    AST 11 01/06/2024    GGT 16 09/17/2011    GGT 354 (H) 05/03/2011    ALKPHOS 101 02/26/2024    ALKPHOS 90 01/06/2024    PROT 9.2 (H) 02/26/2024    PROT 7.5 01/06/2024    ALBUMIN 4.5 02/26/2024    ALBUMIN 3.2 (L) 01/06/2024     Recent Labs     Units 02/28/24  0142 02/28/24  0520 02/28/24  0927 02/28/24  1409   POCGLU mg/dL 782 956 213 086     Recent Labs     Units 02/26/24  1914 02/27/24  0524 02/27/24  0924 02/27/24  0950 02/28/24  0345   WBC 10*9/L 18.5* 11.4*  --   --  14.2*   RBC 10*12/L 5.42* 5.23*  --   --  4.23   HGB g/dL 57.8* 46.9  --  62.9 52.8   HCT % 46.3* 45.4*  --   --  36.8   MCV fL 85.4 86.8  --   --  87.1   MCH pg 28.1 27.7  --   --  28.3   MCHC g/dL 41.3 24.4*  --   --  01.0   RDW % 17.5* 17.9*  --   --  17.6*   PLT 10*9/L 314 >271  --   --  234   MPV fL 7.8  --  Not Detected  --  8.4     No results for input(s): INR, APTT in the last 24 hours.    Invalid input(s): PTPATIENT   Lines & Tubes:   Patient Lines/Drains/Airways Status       Active Peripheral & Central Intravenous Access       Name Placement date Placement time Site Days    Peripheral IV 02/26/24 Anterior;Right;Upper Arm 02/26/24  1916  Arm  1                     Patient Lines/Drains/Airways Status       Active Wounds       Name Placement date Placement time Site Days    Surgical Site 04/08/18 Shoulder Left 04/08/18  1338  -- 2152    Surgical Site 06/05/22 Eye Left 06/05/22  0928  -- 633    Surgical Site 06/19/22 Eye Right 06/19/22  1024  -- 619    Wound 06/24/20 Other (comment) Buttocks inner right buttock  06/24/20  1010  Buttocks  1344                     Respiratory/ventilator settings for last 24 hours:   Vent Mode: PRVC  FiO2 (%): 80 %  S RR: 15  S VT: 300 mL  PEEP: 8 cm H20    Intake/Output last 3 shifts:  I/O last 3 completed shifts:  In: 2626.6 [I.V.:2000; NG/GT:10; IV Piggyback:616.7]  Out: 1320 [Urine:330; Emesis/NG output:90; Other:900]    Daily/Recent Weight:  65.1 kg (143 lb 8.3 oz)    BMI:  Body mass index is 27.13 kg/m??.                                  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    Chronic kidney disease     Depression, psychotic (CMS-HCC)     Diabetes mellitus (CMS-HCC)     in past    Emphysema of lung (CMS-HCC)     Financial difficulties     Frail elderly     Hearing impairment     Hernia     History of transfusion     Hyperlipidemia     Hypertension     Impaired mobility     Osteoporosis     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/08/2015    Visual impairment      Past Surgical History:   Procedure Laterality Date ABDOMINAL SURGERY      BLADDER SURGERY      BREAST CYST EXCISION      CHEMOTHERAPY  2012    bladder    GALLBLADDER SURGERY      stone removal    ILEOSTOMY  2012    PR COLONOSCOPY FLX DX W/COLLJ SPEC WHEN PFRMD N/A 02/09/2015    Procedure: COLONOSCOPY, FLEXIBLE, PROXIMAL TO SPLENIC FLEXURE; DIAGNOSTIC, W/WO COLLECTION SPECIMEN BY BRUSH OR WASH;  Surgeon: Dewaine Conger, MD;  Location: [facility] ;  Service: Gastroenterology    PR RECONSTR TOTAL SHOULDER IMPLANT Left 04/08/2018    Procedure: ARTHROPLASTY, GLENOHUMERAL JOINT; TOTAL SHOULDER(GLENOID & PROXIMAL HUMERAL REPLACEMENT(EG, TOTAL SHOULDER);  Surgeon: Tomasa Rand, MD;  [facility]: [facility];  Service: Ortho Sports Medicine    PR SIGMOIDOSCOPY,BIOPSY N/A 03/11/2015    Procedure: SIGMOIDOSCOPY, FLEXIBLE; WITH BIOPSY, SINGLE OR MULTIPLE;  Surgeon: Wilburt Finlay, MD;  Location: [facility];  Service: Gastroenterology    PR XCAPSL CTRC RMVL INSJ IO LENS PROSTH W/O ECP Left 06/05/2022    Procedure: EXTRACAPSULAR CATARACT REMOVAL W/INSERTION OF INTRAOCULAR LENS PROSTHESIS, MANUAL OR MECHANICAL TECHNIQUE WITHOUT ENDOSCOPIC CYCLOPHOTOCOAGULATION;  Surgeon: Garner Gavel, MD;  [facility]: [facility] OR [facility];  Service: Ophthalmology    PR XCAPSL CTRC RMVL INSJ IO LENS PROSTH W/O ECP Right 06/19/2022    Procedure: EXTRACAPSULAR CATARACT REMOVAL W/INSERTION OF INTRAOCULAR LENS PROSTHESIS, MANUAL OR MECHANICAL TECHNIQUE WITHOUT ENDOSCOPIC CYCLOPHOTOCOAGULATION;  Surgeon: Garner Gavel, MD;  Location: [facility] OR [facility];  Service: Ophthalmology     Scheduled Medications:   [Provider Hold] aspirin  81 mg Oral Daily    [Provider Hold] atorvastatin  40 mg Oral Daily    cefTRIAXone  1 g Intravenous Q24H    [Provider Hold] dilTIAZem  120 mg Oral Daily    [Provider Hold] fluPHENAZine decanoate  50 mg Intramuscular Q30 Days    [Provider Hold] furosemide  20 mg Oral Daily    [Provider Hold] gabapentin  100 mg Oral TID    heparin (porcine) for subcutaneous use  5,000 Units Subcutaneous Q8H [facility]    insulin lispro  0-20 Units Subcutaneous Q4H [facility]    lactated ringers  1,000 mL Intravenous Once    metroNIDAZOLE  500 mg Intravenous Q8H    umeclidinium  1 puff Inhalation Daily (RT)     Continuous Infusions:   fentaNYL citrate (PF) 50 mcg/mL infusion 50 mcg/hr (02/28/24 1515)    NORepinephrine bitartrate-NS Stopped (02/28/24 1535)    propofol 10 mg/mL infusion 30 mcg/kg/min (02/28/24 1532)     PRN Medications:  acetaminophen, albuterol, cyclobenzaprine, dextrose in water, fentaNYL (PF) **OR** fentaNYL (PF), glucagon, HYDROmorphone, nicotine polacrilex **OR** nicotine polacrilex, ondansetron    ATTENDING ATTESTATION:    I performed a history and physical examination of the patient and discussed the patient's management with the Resident. I reviewed the Resident's note and agree with the documented findings and plan of care.    Will wean ventilator and sedation as tolerated.  Ensure adequate resuscitation and wean pressors as tolerated    This patient was critically ill during my evaluation due to Acute Pain and acute respiratory insufficiency secondary to small bowel obstruction and distributive shock .    My interventions included Management of Mechanical ventilation and sedation, Pressors for hypotension, Adjustment of pain medication, and Frequent Neuro status checks,  Monitoring of blood pressure, heart rate and volume status, Review of all films, cultures and labs, Discussion with primary team, consults, and family regarding the patient???s status and prognosis.  My total critical care time, excluding procedures, was 55 minutes.    ________________________________

## 2024-02-28 NOTE — Unmapped (Signed)
 Copied from CRM #[id number]. Topic: Access To Clinicians - Order Question  >> Feb 28, 2024  9:13 AM Elvis Coil wrote:  Gerilyn Pilgrim from [employer] called to see if we received the 2nd request for an addendum for chart notes for the patient and  has questions regarding the following: incontinence order and wants to make sure that the notes that are sent back shows the need for the patient getting incontinence order. He stated that they will not be able to fill the patients orders until this is received.     If a call back or question are needed you can call any representative at [phone].      Routine callback turnaround time: 24-48 business hours. Programmer, systems Notified)

## 2024-02-29 LAB — BASIC METABOLIC PANEL
ANION GAP: 18 mmol/L — ABNORMAL HIGH (ref 5–14)
ANION GAP: 18 mmol/L — ABNORMAL HIGH (ref 5–14)
ANION GAP: 14 mmol/L (ref 5–14)
BLOOD UREA NITROGEN: 63 mg/dL — ABNORMAL HIGH (ref 9–23)
BLOOD UREA NITROGEN: 61 mg/dL — ABNORMAL HIGH (ref 9–23)
BLOOD UREA NITROGEN: 66 mg/dL — ABNORMAL HIGH (ref 9–23)
BUN / CREAT RATIO: 27
BUN / CREAT RATIO: 24
BUN / CREAT RATIO: 24
CALCIUM: 8.4 mg/dL — ABNORMAL LOW (ref 8.7–10.4)
CALCIUM: 8.5 mg/dL — ABNORMAL LOW (ref 8.7–10.4)
CALCIUM: 8.8 mg/dL (ref 8.7–10.4)
CHLORIDE: 105 mmol/L (ref 98–107)
CHLORIDE: 106 mmol/L (ref 98–107)
CHLORIDE: 105 mmol/L (ref 98–107)
CO2: 19 mmol/L — ABNORMAL LOW (ref 20.0–31.0)
CO2: 18 mmol/L — ABNORMAL LOW (ref 20.0–31.0)
CO2: 21 mmol/L (ref 20.0–31.0)
CREATININE: 2.37 mg/dL — ABNORMAL HIGH (ref 0.55–1.02)
CREATININE: 2.54 mg/dL — ABNORMAL HIGH (ref 0.55–1.02)
CREATININE: 2.78 mg/dL — ABNORMAL HIGH (ref 0.55–1.02)
EGFR CKD-EPI (2021) FEMALE: 20 mL/min/{1.73_m2} — ABNORMAL LOW (ref >=60)
EGFR CKD-EPI (2021) FEMALE: 19 mL/min/{1.73_m2} — ABNORMAL LOW (ref >=60)
EGFR CKD-EPI (2021) FEMALE: 17 mL/min/{1.73_m2} — ABNORMAL LOW (ref >=60)
GLUCOSE RANDOM: 155 mg/dL (ref 70–179)
GLUCOSE RANDOM: 165 mg/dL — ABNORMAL HIGH (ref 70–99)
GLUCOSE RANDOM: 198 mg/dL — ABNORMAL HIGH (ref 70–99)
POTASSIUM: 4.9 mmol/L — ABNORMAL HIGH (ref 3.4–4.8)
POTASSIUM: 5 mmol/L — ABNORMAL HIGH (ref 3.4–4.8)
POTASSIUM: 5.1 mmol/L — ABNORMAL HIGH (ref 3.4–4.8)
SODIUM: 142 mmol/L (ref 135–145)
SODIUM: 142 mmol/L (ref 135–145)
SODIUM: 140 mmol/L (ref 135–145)

## 2024-02-29 LAB — CBC
HEMATOCRIT: 36.7 % (ref 34.0–44.0)
HEMATOCRIT: 36.5 % (ref 34.0–44.0)
HEMOGLOBIN: 11.6 g/dL (ref 11.3–14.9)
HEMOGLOBIN: 11.4 g/dL (ref 11.3–14.9)
MEAN CORPUSCULAR HEMOGLOBIN CONC: 31.5 g/dL — ABNORMAL LOW (ref 32.0–36.0)
MEAN CORPUSCULAR HEMOGLOBIN CONC: 31.4 g/dL — ABNORMAL LOW (ref 32.0–36.0)
MEAN CORPUSCULAR HEMOGLOBIN: 27.7 pg (ref 25.9–32.4)
MEAN CORPUSCULAR HEMOGLOBIN: 27.6 pg (ref 25.9–32.4)
MEAN CORPUSCULAR VOLUME: 87.9 fL (ref 77.6–95.7)
MEAN CORPUSCULAR VOLUME: 87.9 fL (ref 77.6–95.7)
MEAN PLATELET VOLUME: 8.5 fL (ref 6.8–10.7)
MEAN PLATELET VOLUME: 8.5 fL (ref 6.8–10.7)
PLATELET COUNT: 237 10*9/L (ref 150–450)
PLATELET COUNT: 203 10*9/L (ref 150–450)
RED BLOOD CELL COUNT: 4.18 10*12/L (ref 3.95–5.13)
RED BLOOD CELL COUNT: 4.15 10*12/L (ref 3.95–5.13)
RED CELL DISTRIBUTION WIDTH: 18.2 % — ABNORMAL HIGH (ref 12.2–15.2)
RED CELL DISTRIBUTION WIDTH: 18 % — ABNORMAL HIGH (ref 12.2–15.2)
WBC ADJUSTED: 13.1 10*9/L — ABNORMAL HIGH (ref 3.6–11.2)
WBC ADJUSTED: 11.7 10*9/L — ABNORMAL HIGH (ref 3.6–11.2)

## 2024-02-29 LAB — BLOOD GAS CRITICAL CARE PANEL, ARTERIAL
BASE EXCESS ARTERIAL: -7.9 — ABNORMAL LOW (ref -2.0–2.0)
CALCIUM IONIZED ARTERIAL (MG/DL): 4.6 mg/dL (ref 4.40–5.40)
GLUCOSE WHOLE BLOOD: 160 mg/dL (ref 70–179)
HCO3 ARTERIAL: 18 mmol/L — ABNORMAL LOW (ref 22–27)
HEMOGLOBIN BLOOD GAS: 11.8 g/dL — ABNORMAL LOW (ref 12.00–16.00)
LACTATE BLOOD ARTERIAL: 1.6 mmol/L — ABNORMAL HIGH (ref <1.3)
O2 SATURATION ARTERIAL: 98.9 % (ref 94.0–100.0)
PCO2 ARTERIAL: 39.4 mmHg (ref 35.0–45.0)
PH ARTERIAL: 7.28 — ABNORMAL LOW (ref 7.35–7.45)
PO2 ARTERIAL: 140 mmHg — ABNORMAL HIGH (ref 80.0–110.0)
POTASSIUM WHOLE BLOOD: 4.8 mmol/L — ABNORMAL HIGH (ref 3.4–4.6)
SODIUM WHOLE BLOOD: 140 mmol/L (ref 135–145)

## 2024-02-29 LAB — CREATININE, URINE: CREATININE, URINE: 159.5 mg/dL

## 2024-02-29 LAB — MAGNESIUM
MAGNESIUM: 2.7 mg/dL — ABNORMAL HIGH (ref 1.6–2.6)
MAGNESIUM: 2.9 mg/dL — ABNORMAL HIGH (ref 1.6–2.6)

## 2024-02-29 LAB — PHOSPHORUS: PHOSPHORUS: 5.7 mg/dL — ABNORMAL HIGH (ref 2.4–5.1)

## 2024-02-29 LAB — SODIUM, URINE, RANDOM: SODIUM URINE: 16 mmol/L

## 2024-02-29 MED ADMIN — Propofol (DIPRIVAN) injection: INTRAVENOUS | @ 01:00:00 | Stop: 2024-02-28

## 2024-02-29 MED ADMIN — ROCuronium (ZEMURON) injection: INTRAVENOUS | Stop: 2024-02-28

## 2024-02-29 MED ADMIN — lactated ringers bolus 1,000 mL: 1000 mL | INTRAVENOUS | @ 22:00:00 | Stop: 2024-02-29

## 2024-02-29 MED ADMIN — calcium chloride 100 mg/mL (10 %) syringe: INTRAVENOUS | Stop: 2024-02-28

## 2024-02-29 MED ADMIN — acetaminophen (OFIRMEV) 10 mg/mL injection 650 mg 65 mL: 650 mg | INTRAVENOUS | @ 23:00:00 | Stop: 2024-03-01

## 2024-02-29 MED ADMIN — amiodarone in dextrose,iso-osm (NEXTERONE) 150 mg/100 mL (1.5 mg/mL) IVPB 150 mg: 150 mg | INTRAVENOUS | @ 18:00:00 | Stop: 2024-02-29

## 2024-02-29 MED ADMIN — NORepinephrine 8 mg in dextrose 5 % 250 mL (32 mcg/mL) infusion PMB: 0-10 ug/min | INTRAVENOUS | @ 03:00:00

## 2024-02-29 MED ADMIN — lactated ringers bolus 1,000 mL: 1000 mL | INTRAVENOUS | @ 14:00:00 | Stop: 2024-02-29

## 2024-02-29 MED ADMIN — amiodarone 360 mg in dextrose 5% 200 mL (1.8 mg/mL) infusion PMB: 1 mg/min | INTRAVENOUS | @ 18:00:00 | Stop: 2024-02-29

## 2024-02-29 MED ADMIN — piperacillin-tazobactam (ZOSYN) 3.375 g in sodium chloride 0.9 % (NS) 100 mL IVPB-MBP: 3.375 g | INTRAVENOUS | @ 18:00:00 | Stop: 2024-03-04

## 2024-02-29 MED ADMIN — fentaNYL (PF) (SUBLIMAZE) injection: INTRAVENOUS | Stop: 2024-02-28

## 2024-02-29 MED ADMIN — insulin lispro (HumaLOG) injection 0-20 Units: 0-20 [IU] | SUBCUTANEOUS

## 2024-02-29 MED ADMIN — piperacillin-tazobactam (ZOSYN) 3.375 g in sodium chloride 0.9 % (NS) 100 mL IVPB-MBP: 3.375 g | INTRAVENOUS | @ 06:00:00 | Stop: 2024-02-29

## 2024-02-29 MED ADMIN — heparin (porcine) 5,000 unit/mL injection 5,000 Units: 5000 [IU] | SUBCUTANEOUS | @ 11:00:00

## 2024-02-29 MED ADMIN — lactated ringers bolus 1,000 mL: 1000 mL | INTRAVENOUS | @ 08:00:00 | Stop: 2024-02-29

## 2024-02-29 MED ADMIN — propofol (DIPRIVAN) infusion 10 mg/mL: 0-50 ug/kg/min | INTRAVENOUS | @ 03:00:00

## 2024-02-29 MED ADMIN — pantoprazole (Protonix) injection 40 mg: 40 mg | INTRAVENOUS | @ 14:00:00

## 2024-02-29 MED ADMIN — metroNIDAZOLE (FLAGYL) IVPB 500 mg: 500 mg | INTRAVENOUS | Stop: 2024-02-28

## 2024-02-29 MED ADMIN — sugammadex (BRIDION) injection: INTRAVENOUS | @ 03:00:00 | Stop: 2024-02-28

## 2024-02-29 MED ADMIN — acetaminophen (OFIRMEV) 10 mg/mL injection 650 mg 65 mL: 650 mg | INTRAVENOUS | @ 15:00:00 | Stop: 2024-03-01

## 2024-02-29 NOTE — Unmapped (Signed)
 [facility] PROGRESS NOTE     Date of Service: 02/29/2024    Hospital Day: LOS: 2 days        Surgery Date: TBD   Surgical Attending: [name], MD    Critical Care Attending: Robina Ade, MD    Interval History:   Remains on NE 6 this AM. Echo pending. Receiving additional 1L LR bolus today.     History of Present Illness:   Tammy Braun is a 83 y.o. female with PMH bladder cancer with cystectomy and ileal conduit, COPD, recent dx of afib, moderate-severe TR, CAD (high calcification score), T2DM presented to [facility] with projectile vomiting and abdominal pain, found to have SBO with transition point at level of ileostomy on CT A/P. On initial presentation, labs remarkable for leukocytosis thought to be iso hemoconcentration, AKI on CKD, mildly elevated venous lactate improved with resuscitation. Admitted to [facility] for worsening hypoxia on HFNC requiring intubation in setting of hiatal hernia. OR on 02/28/24 for ex-lap, reduciton of parastomal hernia, small bowel resection with primary anastomosis, lysis of adhesions, abdomen left open with ABThera in place.     Hospital Course:  - 02/26/24: Admitted to [facility], floor status, consult to urology for possible ileal conduit revision.   - 02/27/24: Patient level of care escalated to step-down iso increased oxygen needs while in ED.  - 02/28/24: Upgraded to ICU. Intubated at bed-side. OR for ex-lap, reduction of parastomal hernia, small bowel resection with primary anastomosis, lysis of adhesions, abdomen left open with ABThera in place.     Review of Systems    A 12 system review of systems was negative except as noted in HPI    Allergies:    Lisinopril, Losartan, and Hctz [hydrochlorothiazide]      Medications:       Current Facility-Administered Medications   Medication Dose Route Frequency Provider Last Rate Last Admin    acetaminophen (OFIRMEV) 10 mg/mL injection 650 mg 65 mL  650 mg Intravenous Q8H Tinsley, Brittany L, NP 260 mL/hr at 02/29/24 0943 650 mg at 02/29/24 0943    [Transfer Hold] albuterol 2.5 mg /3 mL (0.083 %) nebulizer solution 2.5 mg  2.5 mg Nebulization Q6H PRN Romie Jumper, FNP   2.5 mg at 02/28/24 1322    [Provider Hold] aspirin chewable tablet 81 mg  81 mg Oral Daily Concha Pyo, MD PhD   81 mg at 02/28/24 0912    [Provider Hold] atorvastatin (LIPITOR) tablet 40 mg  40 mg Oral Daily Concha Pyo, MD PhD   40 mg at 02/28/24 0912    [Provider Hold] cyclobenzaprine (FLEXERIL) tablet 5 mg  5 mg Oral BID PRN Concha Pyo, MD PhD        Atlee Abide Hold] dextrose (D10W) 10% bolus 125 mL  12.5 g Intravenous Q10 Min PRN Concha Pyo, MD PhD        [Provider Hold] dilTIAZem (CARDIZEM CD) 24 hr capsule 120 mg  120 mg Oral Daily Newt Minion [place], FNP   120 mg at 02/28/24 0912    [Transfer Hold] fentaNYL (PF) (SUBLIMAZE) injection 25 mcg  25 mcg Intravenous Q30 Min PRN Drema Balzarine S, [place]        Or    [Transfer Hold] fentaNYL (PF) (SUBLIMAZE) injection 50 mcg  50 mcg Intravenous Q30 Min PRN Maryann Alar, [place]   50 mcg at 02/28/24 1551    fentaNYL PF (SUBLIMAZE) (50 mcg/mL) infusion (bag)  0-200 mcg/hr Intravenous Continuous Maryann Alar,  PA 0.6 mL/hr at 02/29/24 0827 30 mcg/hr at 02/29/24 0827    [Provider Hold] fluPHENAZine decanoate (PROLIXIN) injection 50 mg  50 mg Intramuscular Q30 Days Concha Pyo, MD PhD        [Provider Hold] furosemide (LASIX) tablet 20 mg  20 mg Oral Daily Concha Pyo, MD PhD        [Provider Hold] gabapentin (NEURONTIN) capsule 100 mg  100 mg Oral TID Concha Pyo, MD PhD        Atlee Abide Hold] glucagon injection 1 mg  1 mg Intramuscular Once PRN Concha Pyo, MD PhD        heparin (porcine) 5,000 unit/mL injection 5,000 Units  5,000 Units Subcutaneous [facility] Lala Lund, ACNP   5,000 Units at 02/29/24 2595    HYDROmorphone (PF) (DILAUDID) injection Syrg 0.5 mg  0.5 mg Intravenous Q4H PRN Gracelyn Nurse L, NP        Or    HYDROmorphone (DILAUDID) injection 1 mg  1 mg Intravenous Q4H PRN Clide Cliff, NP        [Transfer Hold] HYDROmorphone (PF) (DILAUDID) injection Syrg 0.2 mg  0.2 mg Intravenous Q2H PRN Concha Pyo, MD PhD        Atlee Abide Hold] insulin lispro (HumaLOG) injection 0-20 Units  0-20 Units Subcutaneous Q4H [facility] Concha Pyo, MD PhD        Atlee Abide Hold] nicotine polacrilex (NICORETTE) gum 4 mg  4 mg Buccal Q1H PRN Concha Pyo, MD PhD        Or    Atlee Abide Hold] nicotine polacrilex (NICORETTE) lozenge 4 mg  4 mg Buccal Q1H PRN Concha Pyo, MD PhD        NORepinephrine 8 mg in dextrose 5 % 250 mL (32 mcg/mL) infusion PMB  0-10 mcg/min Intravenous Continuous Lala Lund, ACNP 9.4 mL/hr at 02/29/24 0930 5 mcg/min at 02/29/24 0930    [Transfer Hold] ondansetron (ZOFRAN) injection 4 mg  4 mg Intravenous Q6H PRN Adele Dan Hobgood, AGNP        oxyCODONE (ROXICODONE) immediate release tablet 5 mg  5 mg Oral Q4H PRN Clide Cliff, NP        Or    oxyCODONE (ROXICODONE) immediate release tablet 10 mg  10 mg Oral Q4H PRN Clide Cliff, NP        pantoprazole (Protonix) injection 40 mg  40 mg Intravenous Daily Lala Lund, ACNP   40 mg at 02/29/24 0917    piperacillin-tazobactam (ZOSYN) 3.375 g in sodium chloride 0.9 % (NS) 100 mL IVPB-MBP  3.375 g Intravenous Q12H Lala Lund, ACNP        propofol (DIPRIVAN) infusion 10 mg/mL  0-50 mcg/kg/min Intravenous Continuous Lala Lund, ACNP   Paused at 02/29/24 6387    [Transfer Hold] umeclidinium (INCRUSE ELLIPTA) 62.5 mcg/actuation inhaler 1 puff  1 puff Inhalation Daily (RT) Concha Pyo, MD PhD   1 puff at 02/28/24 1236     Facility-Administered Medications Ordered in Other Encounters   Medication Dose Route Frequency Provider Last Rate Last Admin    EPINEPHrine 100 mcg/10 mL (10 mcg/mL) injection   Intravenous PRN (once a day) Ichite, Precious C, MD   10 mcg at 02/28/24 1511    Propofol (DIPRIVAN) injection   Intravenous PRN (once a day) Ichite, Precious C, MD   60 mg at 02/28/24 1507    succinylcholine (ANECTINE) injection   Intravenous PRN (once a day) Ichite,  Precious C, MD   60 mg at 02/28/24 1507         Past Medical History    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    Chronic kidney disease     Depression, psychotic (CMS-HCC)     Diabetes mellitus (CMS-HCC)     in past    Emphysema of lung (CMS-HCC)     Financial difficulties     Frail elderly     Hearing impairment     Hernia     History of transfusion     Hyperlipidemia     Hypertension     Impaired mobility     Osteoporosis     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/08/2015    Visual impairment          Past Surgical History    Past Surgical History:   Procedure Laterality Date    ABDOMINAL SURGERY      BLADDER SURGERY      BREAST CYST EXCISION      CHEMOTHERAPY  2012    bladder    GALLBLADDER SURGERY      stone removal    ILEOSTOMY  2012    PR COLONOSCOPY FLX DX W/COLLJ SPEC WHEN PFRMD N/A 02/09/2015    Procedure: COLONOSCOPY, FLEXIBLE, PROXIMAL TO SPLENIC FLEXURE; DIAGNOSTIC, W/WO COLLECTION SPECIMEN BY BRUSH OR WASH;  Surgeon: Dewaine Conger, MD;  Location: [facility] PROCEDURES [place];  Service: Gastroenterology    PR RECONSTR TOTAL SHOULDER IMPLANT Left 04/08/2018    Procedure: ARTHROPLASTY, GLENOHUMERAL JOINT; TOTAL SHOULDER(GLENOID & PROXIMAL HUMERAL REPLACEMENT(EG, TOTAL SHOULDER);  Surgeon: Tomasa Rand, MD;  [facility]: [facility] OR [facility];  Service: Ortho Sports Medicine    PR SIGMOIDOSCOPY,BIOPSY N/A 03/11/2015    Procedure: SIGMOIDOSCOPY, FLEXIBLE; WITH BIOPSY, SINGLE OR MULTIPLE;  Surgeon: Wilburt Finlay, MD;  [facility];  Service: Gastroenterology    PR XCAPSL CTRC RMVL INSJ IO LENS PROSTH W/O ECP Left 06/05/2022    Procedure: EXTRACAPSULAR CATARACT REMOVAL W/INSERTION OF INTRAOCULAR LENS PROSTHESIS, MANUAL OR MECHANICAL TECHNIQUE WITHOUT ENDOSCOPIC CYCLOPHOTOCOAGULATION;  Surgeon: Garner Gavel, MD;  Location: [facility] OR [facility];  Service: Ophthalmology    PR XCAPSL CTRC RMVL INSJ IO LENS PROSTH W/O ECP Right 06/19/2022    Procedure: EXTRACAPSULAR CATARACT REMOVAL W/INSERTION OF INTRAOCULAR LENS PROSTHESIS, MANUAL OR MECHANICAL TECHNIQUE WITHOUT ENDOSCOPIC CYCLOPHOTOCOAGULATION;  Surgeon: Garner Gavel, MD;  Location: [facility] OR [facility];  Service: Ophthalmology         Family History    The patient's family history includes Breast cancer (age of onset: 92) in her daughter; Diabetes in her mother; Glaucoma in her father..    Social History:    Tobacco use: smoker, 1 PPD  Alcohol use: denies  Drug use: denies     ASSESSMENT & PLAN:     Neurologic:  - Pain: Sch IV Tylenol, IV Robaxin, HM IV PRN 0.5-1mg   - Fent gtt  - Sedation: Propofol gtt     HEENT:  NAI    Cardiovascular:  #HTN, HLD:   - Chronic. Hemodynamically tenuous requiring low dose vasopressor at this time.   - MAP goal > 65  - Holding home ASA and Lipitor    #Afib/flutter w RVR:  Prior hx of paroxysmal afib, flares iso acute illnesses or hospitalization per dauther. First report of A-fib in chart from Dec 2024 admission. Will tx underlying stressor of SBO w OR this evening. CHADSvasc = 6, not anticoagulated at baseline per chart review.   - IV amio (bolus + gtt) if HR > 150 after bolus  - Holding home diltiazem, question efficacy given likely poor absorption  - Echo 3/1: Pending  - Pro-BNP: Pending      Respiratory:  #Acute on chronic hypoxemia  #Hx of COPD:   - 2/28: Intubated  - CXR 02/26/27: large R complex hernia w/ compressive atelectasis of R lung, no pleural effusion of ptx  - Repeat CXR post intubation: good position ~4cm  - Continue pulmonary toilet  - Home incruse ellipta   - PRN albuterol   - Daily SBT  - Daily ABG    FEN/GI:  F: Medlocked, got 1L bolus overnight, 1L  E: Replete electrolytes PRN  N: NPO   - NGT to LIWS  - GI prophylaxis: zofran prn for nausea; IV protnix   - Bowel regimen: held iso bowel obstruction     #S/p ex-lap, reduction of parastomal hernia, small bowel resection with primary anastomosis, lysis of adhesions, abdomen left open with ABThera in place 2/28  - ABThera output 350 ml    #Large hiatal hernia, chronic  - Non-operative, chronic    Renal/Genitourinary:  #AKI on CKD:   - Cr 2.4 < 2.2 (Baseline 1.4)  - FeNa 2/27 1.9% (Indeterminate)  - Repeat Urine lytes + BMP  - q12hr BMPs    #Hydronephrosis:  - Caution with perioperative fluid resuscitation per Cards   - Consideration for c/s to nephrology iso hyperK, CKD   - If persistent AKI after closure consider renal ultrasound to assess hydro/obstruction    #H/o bladder cancer s/p radical cystectomy with ileal conduit (2012):  #Parastomal hernia:  - SRU consulted, following   - Strict I&Os  - Foley in conduit, low UOP  - Tentative plan for OR tomorrow for urostomy revision/abdominal closure    Endocrine:   - Glycemic Control: SSI    Hematologic:   - Monitor CBC daily, transfuse products as indicated    Immunologic/Infectious Disease:  - Afebrile, leukocytosis 13.1 < 5.8 < 14.1, continue to monitor  - Cultures:   - Ucx (2/26): Mixed GP/GN organisms    - RPP (2/27): negative   - Abx:    - Zosyn x4d (3/1 - 3/5)    Musculoskeletal:   NAI    Daily Care Checklist:   - Stress Ulcer Prevention: Yes: Mechanically ventilated  - DVT Prophylaxis: Chemical: SQ Heparin  - Daily Awakening: Yes  - Spontaneous Breathing Trial:  post-op  - Indication for Central/PICC Line: Yes  Infusions requiring central access, Hemodynamic monitoring, and Inadequate peripheral access  - Indication for Urinary Catheter: Yes  Strict intake and output, Agressive diuresis/hydration, and Urinary retention/obstruction  - Diagnostic images/reports of past 24hrs reviewed: Yes    Disposition:   - Continue ICU care  - PT/OT consulted:  will consult post OR    Mindi Junker, MD   Surgical Intensive Care Unit  [place] [facility]     SUBJECTIVE:      Intubated, sedated.     OBJECTIVE:     Physical Exam:  Constitutional: Intubated, sedated.  Neurologic: Intubated, sedated.  HEENT: ETT and NG tube in place to LIWS.   Respiratory: NOWB on vent.  Cardiovascular: Tachycardic, irregular rhythm, mild edema to LE   Gastrointestinal: Mildly distended abdomen, ABThera in place draining SS fluid. Urostomy with Foley in stoma draining clear yellow urine.   Musculoskeletal: Mild BLE edema.  Skin: No superficial abrasions, cuts, lesions on clothed exam     Temp:  [36.9 ??C (98.5 ??F)-38.4 ??C (101.2 ??F)] 38.4 ??C (101.2 ??F)  Heart Rate:  [114-148] 138  SpO2 Pulse:  [105-139] 128  Resp:  [10-30] 30  BP: (72-170)/(39-101) 105/56  MAP (mmHg):  [45-120] 70  A BP-1: (64-111)/(44-58) 111/58  MAP:  [57 mmHg-75 mmHg] 75 mmHg  A BP-2: (76-107)/(33-60) 107/60  MAP:  [35 mmHg-63 mmHg] 35 mmHg  FiO2 (%):  [50 %-100 %] 50 %  SpO2:  [86 %-100 %] 100 %       Recent Laboratory Results:  Recent Labs     Units 02/28/24  2204   PHART  7.26*   PCO2ART mm Hg 38.8   PO2ART mm Hg 89.2   HCO3ART mmol/L 17*   BEART  -8.9*   O2SATART % 96.7     Recent Labs     Units 02/28/24  1758 02/28/24  1830 02/28/24  2204 02/29/24  0435   NA mmol/L 140   < > 142 - 141 142   K mmol/L 4.7   < > 4.6 - 4.4 4.9*   CL mmol/L 103  --  104 105   CO2 mmol/L 21.0  --  20.0 19.0*   BUN mg/dL 59*  --  65* 63*   CREATININE mg/dL 3.87*  --  5.64* 3.32*   GLU mg/dL 951  --  884 166    < > = values in this interval not displayed.     Lab Results   Component Value Date    BILITOT 0.5 02/26/2024    BILITOT 0.3 01/06/2024    ALT 20 02/26/2024    ALT 12 01/06/2024    AST 31 02/26/2024    AST 11 01/06/2024    GGT 16 09/17/2011    GGT 354 (H) 05/03/2011    ALKPHOS 101 02/26/2024    ALKPHOS 90 01/06/2024    PROT 9.2 (H) 02/26/2024    PROT 7.5 01/06/2024    ALBUMIN 4.5 02/26/2024    ALBUMIN 3.2 (L) 01/06/2024     Recent Labs Units 02/28/24  1409   POCGLU mg/dL 063     Recent Labs     Units 02/28/24  1758 02/28/24  2204 02/29/24  0435   WBC 10*9/L 14.1* 5.8 13.1*   RBC 10*12/L 3.99 4.23 4.18   HGB g/dL 01.6* 01.0 93.2   HCT % 35.0 36.9 36.7   MCV fL 87.6 87.0 87.9   MCH pg 27.7 27.9 27.7   MCHC g/dL 35.5* 73.2 20.2*   RDW % 17.7* 18.2* 18.2*   PLT 10*9/L 221 244 237   MPV fL 8.3 8.6 8.5     No results for input(s): INR, APTT in the last 24 hours.    Invalid input(s): PTPATIENT   Lines & Tubes:   Patient Lines/Drains/Airways Status       Active Peripheral & Central Intravenous Access       Name Placement date Placement time Site Days    Peripheral IV 02/26/24 Anterior;Right;Upper Arm 02/26/24  1916  Arm  2    CVC Triple Lumen 02/28/24 Non-tunneled Right Internal jugular 02/28/24  1641  Internal jugular  less than 1  Patient Lines/Drains/Airways Status       Active Wounds       Name Placement date Placement time Site Days    Negative Pressure Wound Therapy Abdomen Mid 02/28/24  2115  Abdomen  less than 1    Surgical Site 04/08/18 Shoulder Left 04/08/18  1338  -- 2152    Surgical Site 06/05/22 Eye Left 06/05/22  0928  -- 634    Surgical Site 06/19/22 Eye Right 06/19/22  1024  -- 620    Surgical Site 02/28/24 Abdomen 02/28/24  2055  -- less than 1    Wound 06/24/20 Other (comment) Buttocks inner right buttock 06/24/20  1010  Buttocks  1345                     Respiratory/ventilator settings for last 24 hours:   Vent Mode: PSV-CPAP  FiO2 (%): 50 %  S RR: 15  S VT: 300 mL  PEEP: 8 cm H20  PR SUP: 8 cm H20    Intake/Output last 3 shifts:  I/O last 3 completed shifts:  In: 5029.3 [I.V.:2940.9; NG/GT:10; IV Piggyback:2078.3]  Out: 1075 [Urine:435; Emesis/NG output:90; Blood:200]    Daily/Recent Weight:  65.1 kg (143 lb 8.3 oz)    BMI:  Body mass index is 27.13 kg/m??.                                  Medical History:  Past Medical History:   Diagnosis Date    Anxiety     Arthritis     At risk for falls     Breast cyst Cancer (CMS-HCC)     bladder    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    Chronic kidney disease     Depression, psychotic (CMS-HCC)     Diabetes mellitus (CMS-HCC)     in past    Emphysema of lung (CMS-HCC)     Financial difficulties     Frail elderly     Hearing impairment     Hernia     History of transfusion     Hyperlipidemia     Hypertension     Impaired mobility     Osteoporosis     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/08/2015    Visual impairment      Past Surgical History:   Procedure Laterality Date    ABDOMINAL SURGERY      BLADDER SURGERY      BREAST CYST EXCISION      CHEMOTHERAPY  2012    bladder    GALLBLADDER SURGERY      stone removal    ILEOSTOMY  2012    PR COLONOSCOPY FLX DX W/COLLJ SPEC WHEN PFRMD N/A 02/09/2015    Procedure: COLONOSCOPY, FLEXIBLE, PROXIMAL TO SPLENIC FLEXURE; DIAGNOSTIC, W/WO COLLECTION SPECIMEN BY BRUSH OR WASH;  Surgeon: Dewaine Conger, MD;  Location: [facility] PROCEDURES [place];  Service: Gastroenterology    PR RECONSTR TOTAL SHOULDER IMPLANT Left 04/08/2018    Procedure: ARTHROPLASTY, GLENOHUMERAL JOINT; TOTAL SHOULDER(GLENOID & PROXIMAL HUMERAL REPLACEMENT(EG, TOTAL SHOULDER);  Surgeon: Tomasa Rand, MD;  [facility];  Service: Ortho Sports Medicine    PR SIGMOIDOSCOPY,BIOPSY N/A 03/11/2015    Procedure: SIGMOIDOSCOPY, FLEXIBLE; WITH BIOPSY, SINGLE OR MULTIPLE;  Surgeon: Wilburt Finlay, MD;  [facility]: [facility];  Service: Gastroenterology    PR XCAPSL CTRC RMVL INSJ IO LENS PROSTH W/O ECP Left  06/05/2022    Procedure: EXTRACAPSULAR CATARACT REMOVAL W/INSERTION OF INTRAOCULAR LENS PROSTHESIS, MANUAL OR MECHANICAL TECHNIQUE WITHOUT ENDOSCOPIC CYCLOPHOTOCOAGULATION;  Surgeon: Garner Gavel, MD;  [facility];  Service: Ophthalmology    PR XCAPSL CTRC RMVL INSJ IO LENS PROSTH W/O ECP Right 06/19/2022    Procedure: EXTRACAPSULAR CATARACT REMOVAL W/INSERTION OF INTRAOCULAR LENS PROSTHESIS, MANUAL OR MECHANICAL TECHNIQUE WITHOUT ENDOSCOPIC CYCLOPHOTOCOAGULATION;  Surgeon: Garner Gavel, MD;  [facility]: [facility];  Service: Ophthalmology     Scheduled Medications:   acetaminophen  650 mg Intravenous Q8H    [Provider Hold] aspirin  81 mg Oral Daily    [Provider Hold] atorvastatin  40 mg Oral Daily    [Provider Hold] dilTIAZem  120 mg Oral Daily    [Provider Hold] fluPHENAZine decanoate  50 mg Intramuscular Q30 Days    [Provider Hold] furosemide  20 mg Oral Daily    [Provider Hold] gabapentin  100 mg Oral TID    heparin (porcine) for subcutaneous use  5,000 Units Subcutaneous Q8H SCH    [Transfer Hold] insulin lispro  0-20 Units Subcutaneous Q4H SCH    pantoprazole (Protonix) intravenous solution  40 mg Intravenous Daily    piperacillin-tazobactam  3.375 g Intravenous Q12H    [Transfer Hold] umeclidinium  1 puff Inhalation Daily (RT)     Continuous Infusions:   fentaNYL citrate (PF) 50 mcg/mL infusion 30 mcg/hr (02/29/24 0827)    NORepinephrine bitartrate-NS 5 mcg/min (02/29/24 0930)    propofol 10 mg/mL infusion Stopped (02/29/24 0718)     PRN Medications:  [Transfer Hold] albuterol, [Provider Hold] cyclobenzaprine, [Transfer Hold] dextrose in water, [Transfer Hold] fentaNYL (PF) **OR** [Transfer Hold] fentaNYL (PF), [Transfer Hold] glucagon, HYDROmorphone **OR** HYDROmorphone, [Transfer Hold] HYDROmorphone, [Transfer Hold] nicotine polacrilex **OR** [Transfer Hold] nicotine polacrilex, [Transfer Hold] ondansetron, oxyCODONE **OR** oxyCODONE      ATTENDING ATTESTATION:    I performed a history and physical examination of the patient and discussed the patient's management with the Resident. I reviewed the Resident's note and agree with the documented findings and plan of care.    Wean ventilator and sedation as tolerated.  Patient with open abdomen with tentative plan to return to operating room tomorrow.  Continue resuscitation and wean pressors as tolerated    This patient was critically ill during my evaluation due to Acute Blood Loss Anemia, Acute Pain, Electrolyte Imbalance, Hypotension, Septic Shock, Metabolic Acidosis, Open Abdomen, and acute respiratory failure secondary to open abdomen and metabolic acidosis .    My interventions included Management of Mechanical ventilation and sedation, Pressors for hypotension, Adjustment of pain medication, Repleation of electrolytes, and Frequent Neuro status checks,  Monitoring of blood pressure, heart rate and volume status, Review of all films, cultures and labs, Discussion with primary team, consults, and family regarding the patient???s status and prognosis.  My total critical care time, excluding procedures, was 87 minutes.    ________________________________

## 2024-02-29 NOTE — Unmapped (Signed)
 [facility]U PROGRESS NOTE     Date of Service: 02/29/2024    Hospital Day: LOS: 2 days        Surgery Date: TBD   Surgical Attending: [name], MD    Critical Care Attending: Robina Ade, MD    Interval History:   Remains off NE and tolerating PSV well.  Planning for OR in the morning.      History of Present Illness:   Tammy Braun is a 83 y.o. female with PMH bladder cancer with cystectomy and ileal conduit, COPD, recent dx of afib, moderate-severe TR, CAD (high calcification score), T2DM presented to [facility] with projectile vomiting and abdominal pain, found to have SBO with transition point at level of ileostomy on CT A/P. On initial presentation, labs remarkable for leukocytosis thought to be iso hemoconcentration, AKI on CKD, mildly elevated venous lactate improved with resuscitation. Admitted to [facility] for worsening hypoxia on HFNC requiring intubation in setting of hiatal hernia. OR on 02/28/24 for ex-lap, reduciton of parastomal hernia, small bowel resection with primary anastomosis, lysis of adhesions, abdomen left open with ABThera in place.     Hospital Course:  - 02/26/24: Admitted to [facility], floor status, consult to urology for possible ileal conduit revision.   - 02/27/24: Patient level of care escalated to step-down iso increased oxygen needs while in ED.  - 02/28/24: Upgraded to ICU. Intubated at bed-side. OR for ex-lap, reduction of parastomal hernia, small bowel resection with primary anastomosis, lysis of adhesions, abdomen left open with ABThera in place.        ASSESSMENT & PLAN:     Neurologic:  - Pain: Sch IV Tylenol, IV Robaxin, HM IV PRN 0.5-1mg   - Fent gtt  - Sedation: Propofol gtt - currently paused    Cardiovascular:  #HTN, HLD:   - Chronic. Hemodynamically tenuous requiring low dose vasopressor at this time.   - MAP goal > 65  - Holding home ASA and Lipitor    #Afib/flutter w RVR:   Prior hx of paroxysmal afib, flares iso acute illnesses or hospitalization per dauther. First report of A-fib in chart from Dec 2024 admission. Will tx underlying stressor of SBO w OR this evening. CHADSvasc = 6, not anticoagulated at baseline per chart review.   - IV amio (bolus + gtt) if HR > 150 after bolus  - Holding home diltiazem, question efficacy given likely poor absorption  - Echo 3/1: Pending  - Pro-BNP: Pending      Respiratory:  #Acute on chronic hypoxemia  #Hx of COPD:   - 2/28: Intubated  - CXR 02/26/27: large R complex hernia w/ compressive atelectasis of R lung, no pleural effusion of ptx  - Repeat CXR post intubation: good position ~4cm  - Continue pulmonary toilet  - Home incruse ellipta   - PRN albuterol   - Daily SBT  - Daily ABG    FEN/GI:  F: Medlocked, got 1L bolus overnight, 1L  E: Replete electrolytes PRN  N: NPO   - NGT to LIWS  - GI prophylaxis: zofran prn for nausea; IV protnix   - Bowel regimen: held iso bowel obstruction     #S/p ex-lap, reduction of parastomal hernia, small bowel resection with primary anastomosis, lysis of adhesions, abdomen left open with ABThera in place 2/28      #Large hiatal hernia, chronic  - Non-operative, chronic    Renal/Genitourinary:  #AKI on CKD:   - Cr 2.4 < 2.2 (Baseline 1.4)  - FeNa 2/27  1.9% (Indeterminate)    #Hydronephrosis:  - Caution with perioperative fluid resuscitation per Cards   - Consideration for c/s to nephrology iso hyperK, CKD   - If persistent AKI after closure consider renal ultrasound to assess hydro/obstruction    #H/o bladder cancer s/p radical cystectomy with ileal conduit (2012):  #Parastomal hernia:  - SRU consulted, following   - Strict I&Os  - Foley in conduit, low UOP  - Tentative plan for OR tomorrow for urostomy revision/abdominal closure    Endocrine:   - Glycemic Control: SSI    Hematologic:   - Monitor CBC daily, transfuse products as indicated    Immunologic/Infectious Disease:  - Afebrile, leukocytosis 13.1 < 5.8 < 14.1, continue to monitor  - Cultures:   - Ucx (2/26): Mixed GP/GN organisms    - RPP (2/27): negative   - Abx:    - Zosyn x4d (3/1 - 3/5)    Musculoskeletal:   NAI    Daily Care Checklist:   - Stress Ulcer Prevention: Yes: Mechanically ventilated  - DVT Prophylaxis: Chemical: SQ Heparin  - Daily Awakening: Yes  - Spontaneous Breathing Trial:  post-op  - Indication for Central/PICC Line: Yes  Infusions requiring central access, Hemodynamic monitoring, and Inadequate peripheral access  - Indication for Urinary Catheter: Yes  Strict intake and output, Agressive diuresis/hydration, and Urinary retention/obstruction  - Diagnostic images/reports of past 24hrs reviewed: Yes    Disposition:   - Continue ICU care  - PT/OT consulted:  will consult post OR    Tammy Braun is critically ill due to acute kidney injury, altered mental status, anemia - acute blood loss, and atrial fibrillation, SBO, open abdomen, postoperative mechanical ventilation. This critical care time includes examining the patient, evaluating the hemodynamic, laboratory, and radiographic data, independently developing a comprehensive management plan, and serially assessing the patient's response to these critical care interventions. This critical care time excludes procedures.    Critical care time: 30 minutes     Jamie Brookes, PA   Surgical Intensive Care Unit  [place] [facility] [place] [facility]       SUBJECTIVE:      Intubated/sedated     OBJECTIVE:     Physical Exam:  Constitutional: Lying supine, NAD  Neurologic: Sedated, +cough/gag/corneal reflexe   Respiratory: Mechanically ventilated via ETT, coarse lung sounds throughout   Cardiovascular: Tachycardic, irregular rhythm, mild edema to LE   Gastrointestinal: Mildly distended abdomen, ABThera in place draining SS fluid. Urostomy with Foley in stoma draining clear yellow urine.   Musculoskeletal: Mild BLE edema.  Skin: Warm and dry     Temp:  [37.3 ??C (99.1 ??F)-38.4 ??C (101.2 ??F)] 37.6 ??C (99.6 ??F)  Heart Rate:  [124-148] 128  SpO2 Pulse:  [88-140] 127  Resp:  [12-30] 12  BP: (88-125)/(65-95) 113/95  MAP (mmHg):  [74-98] 98  A BP-1: (64-112)/(50-102) 112/102  MAP:  [56 mmHg-105 mmHg] 56 mmHg  FiO2 (%):  [40 %-50 %] 40 %  SpO2:  [99 %-100 %] 100 %       Recent Laboratory Results:  Recent Labs     Units 02/29/24  1117   PHART  7.28*   PCO2ART mm Hg 39.4   PO2ART mm Hg 140.0*   HCO3ART mmol/L 18*   BEART  -7.9*   O2SATART % 98.9     Recent Labs     Units 02/29/24  0435 02/29/24  0841 02/29/24  1117 02/29/24  1609   NA mmol/L 142  142 140 140   K mmol/L 4.9* 5.0* 4.8* 5.1*   CL mmol/L 105 106  --  105   CO2 mmol/L 19.0* 18.0*  --  21.0   BUN mg/dL 63* 61*  --  66*   CREATININE mg/dL 2.84* 1.32*  --  4.40*   GLU mg/dL 102 725*  --  366*     Lab Results   Component Value Date    BILITOT 0.5 02/26/2024    BILITOT 0.3 01/06/2024    ALT 20 02/26/2024    ALT 12 01/06/2024    AST 31 02/26/2024    AST 11 01/06/2024    GGT 16 09/17/2011    GGT 354 (H) 05/03/2011    ALKPHOS 101 02/26/2024    ALKPHOS 90 01/06/2024    PROT 9.2 (H) 02/26/2024    PROT 7.5 01/06/2024    ALBUMIN 4.5 02/26/2024    ALBUMIN 3.2 (L) 01/06/2024     Recent Labs     Units 02/29/24  1203 02/29/24  1827 02/29/24  2214   POCGLU mg/dL 440 347 425     Recent Labs     Units 02/28/24  2204 02/29/24  0435 02/29/24  1609   WBC 10*9/L 5.8 13.1* 11.7*   RBC 10*12/L 4.23 4.18 4.15   HGB g/dL 95.6 38.7 56.4   HCT % 36.9 36.7 36.5   MCV fL 87.0 87.9 87.9   MCH pg 27.9 27.7 27.6   MCHC g/dL 33.2 95.1* 88.4*   RDW % 18.2* 18.2* 18.0*   PLT 10*9/L 244 237 203   MPV fL 8.6 8.5 8.5     No results for input(s): INR, APTT in the last 24 hours.    Invalid input(s): PTPATIENT   Lines & Tubes:   Patient Lines/Drains/Airways Status       Active Peripheral & Central Intravenous Access       Name Placement date Placement time Site Days    Peripheral IV 02/26/24 Anterior;Right;Upper Arm 02/26/24  1916  Arm  3    CVC Triple Lumen 02/28/24 Non-tunneled Right Internal jugular 02/28/24  1641  Internal jugular  1                     Patient Lines/Drains/Airways Status Active Wounds       Name Placement date Placement time Site Days    Negative Pressure Wound Therapy Abdomen Mid 02/28/24  2115  Abdomen  1    Surgical Site 04/08/18 Shoulder Left 04/08/18  1338  -- 2153    Surgical Site 06/05/22 Eye Left 06/05/22  0928  -- 634    Surgical Site 06/19/22 Eye Right 06/19/22  1024  -- 620    Surgical Site 02/28/24 Abdomen 02/28/24  2055  -- 1    Wound 06/24/20 Other (comment) Buttocks inner right buttock 06/24/20  1010  Buttocks  1345                     Respiratory/ventilator settings for last 24 hours:   Vent Mode: PSV-CPAP  FiO2 (%): 40 %  S RR: 15  S VT: 300 mL  PEEP: 8 cm H20  PR SUP: 8 cm H20    Intake/Output last 3 shifts:  I/O last 3 completed shifts:  In: 4676.1 [I.V.:2443.7; IV Piggyback:2232.4]  Out: 1575 [Urine:675; Blood:200]    Daily/Recent Weight:  65.1 kg (143 lb 8.3 oz)    BMI:  Body mass index is 27.13 kg/m??.  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    Chronic kidney disease     Depression, psychotic (CMS-HCC)     Diabetes mellitus (CMS-HCC)     in past    Emphysema of lung (CMS-HCC)     Financial difficulties     Frail elderly     Hearing impairment     Hernia     History of transfusion     Hyperlipidemia     Hypertension     Impaired mobility     Osteoporosis     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/08/2015    Visual impairment      Past Surgical History:   Procedure Laterality Date    ABDOMINAL SURGERY      BLADDER SURGERY      BREAST CYST EXCISION      CHEMOTHERAPY  2012    bladder    GALLBLADDER SURGERY      stone removal    ILEOSTOMY  2012    PR COLONOSCOPY FLX DX W/COLLJ SPEC WHEN PFRMD N/A 02/09/2015    Procedure: COLONOSCOPY, FLEXIBLE, PROXIMAL TO SPLENIC FLEXURE; DIAGNOSTIC, W/WO COLLECTION SPECIMEN BY BRUSH OR WASH;  Surgeon: Dewaine Conger, MD;  Location: [facility] [place];  Service: Gastroenterology    PR RECONSTR TOTAL SHOULDER IMPLANT Left 04/08/2018    Procedure: ARTHROPLASTY, GLENOHUMERAL JOINT; TOTAL SHOULDER(GLENOID & PROXIMAL HUMERAL REPLACEMENT(EG, TOTAL SHOULDER);  Surgeon: Tomasa Rand, MD;  Location: [facility] OR [facility];  Service: Ortho Sports Medicine    PR SIGMOIDOSCOPY,BIOPSY N/A 03/11/2015    Procedure: SIGMOIDOSCOPY, FLEXIBLE; WITH BIOPSY, SINGLE OR MULTIPLE;  Surgeon: Wilburt Finlay, MD;  [facility]: [facility];  Service: Gastroenterology    PR XCAPSL CTRC RMVL INSJ IO LENS PROSTH W/O ECP Left 06/05/2022    Procedure: EXTRACAPSULAR CATARACT REMOVAL W/INSERTION OF INTRAOCULAR LENS PROSTHESIS, MANUAL OR MECHANICAL TECHNIQUE WITHOUT ENDOSCOPIC CYCLOPHOTOCOAGULATION;  Surgeon: Garner Gavel, MD;  Location: [facility] OR [facility];  Service: Ophthalmology    PR XCAPSL CTRC RMVL INSJ IO LENS PROSTH W/O ECP Right 06/19/2022    Procedure: EXTRACAPSULAR CATARACT REMOVAL W/INSERTION OF INTRAOCULAR LENS PROSTHESIS, MANUAL OR MECHANICAL TECHNIQUE WITHOUT ENDOSCOPIC CYCLOPHOTOCOAGULATION;  Surgeon: Garner Gavel, MD;  Location: [facility] OR [facility];  Service: Ophthalmology     Scheduled Medications:   acetaminophen  650 mg Intravenous Q8H    [Provider Hold] aspirin  81 mg Oral Daily    [Provider Hold] atorvastatin  40 mg Oral Daily    [Provider Hold] dilTIAZem  120 mg Oral Daily    [Provider Hold] fluPHENAZine decanoate  50 mg Intramuscular Q30 Days    [Provider Hold] furosemide  20 mg Oral Daily    [Provider Hold] gabapentin  100 mg Oral TID    heparin (porcine) for subcutaneous use  5,000 Units Subcutaneous Q8H SCH    insulin lispro  0-20 Units Subcutaneous Q4H SCH    pantoprazole (Protonix) intravenous solution  40 mg Intravenous Daily    piperacillin-tazobactam  3.375 g Intravenous Q12H    umeclidinium  1 puff Inhalation Daily (RT)     Continuous Infusions:   amiodarone 0.5 mg/min (02/29/24 2000)    fentaNYL citrate (PF) 50 mcg/mL infusion 30 mcg/hr (02/29/24 2000)    NORepinephrine  bitartrate-NS Stopped (02/29/24 1700)    propofol 10 mg/mL infusion Stopped (02/29/24 0718)     PRN Medications:  albuterol, [Provider Hold] cyclobenzaprine, dextrose in water, fentaNYL (PF) **OR** fentaNYL (PF), glucagon, HYDROmorphone **OR** HYDROmorphone, HYDROmorphone, nicotine polacrilex **OR** nicotine polacrilex, ondansetron, oxyCODONE **OR** oxyCODONE

## 2024-02-29 NOTE — Unmapped (Signed)
 UROLOGY CONSULT NOTE    Requesting Attending Physician:  [name], MD  Service Requesting Consult:  [facility])  Service Providing Consult: SRU  Consulting Attending: Dr. Katrinka Blazing     Assessment:  Patient is a 83 y.o. female with hx of bladder cancer s/p RCIC on 10/05/2011 for T2N0M0 with known parastomal hernia (previously stable and reducible) who presented to [facility] with N/V and abdominal distention 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     On evaluation, patient is stable but requiring large bore [place]. Labs notable for WBC 11.4 (down from 19 at admission) and elevated Cr to 2.63 (baseline ~1). UA demonstrates moderate LE, 42 WBCs, moderate bacteria (expected to be dirty). Urine culture pending. CTAP with findings of SBO as described above. Also with stable mild BL hydronephrosis.    In setting of stable hydronephrosis, my concern for an obstruction is relatively low, and her AKI is more likely related to poor PO intake and emesis. However, we will continue to monitor Cr and consider additional imaging if renal function does not improve. Of note, last loopogram negative for stricture or obstruction (11/08/22).     Interval 3/01: patient is now s/p ex-lap LOA, small bowel resection fistula takedown with primary repair, left open. Tachycardia ON 120s-140s, MAPs improved following surgery. ON NE @ 10. UOP low @ ON likely in setting of shock. Cr worse this am 2.54 from 2.2 yesterday. Ostomy pink, viable and productive this am.     Recommendations:  Continue foley catheter per stoma, if it falls out notify urology to replace  Urology will be available for planned take-back   We will continue to follow.     Thank you for this consult. Please page [phone] with any questions or concerns.    History of Present Illness:   Tammy Braun is seen in consultation for SBO at the request of [name], MD on the [facility]).     Endorses 1 day history of worsening abdominal pain associated with N/V. Feels constipation and has not had BM in at least a day. Minimal PO intake.     Denies change in urine output. Does not feel like her urine has been malodourous or has not noted decreased output from ostomy.     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    Chronic kidney disease     Depression, psychotic (CMS-HCC)     Diabetes mellitus (CMS-HCC)     in past    Emphysema of lung (CMS-HCC)     Financial difficulties     Frail elderly     Hearing impairment     Hernia     History of transfusion     Hyperlipidemia     Hypertension     Impaired mobility     Osteoporosis     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/08/2015    Visual impairment        Past Surgical History:   Past Surgical History:   Procedure Laterality Date    ABDOMINAL SURGERY      BLADDER SURGERY      BREAST CYST EXCISION      CHEMOTHERAPY  2012    bladder  GALLBLADDER SURGERY      stone removal    ILEOSTOMY  2012    PR COLONOSCOPY FLX DX W/COLLJ SPEC WHEN PFRMD N/A 02/09/2015    Procedure: COLONOSCOPY, FLEXIBLE, PROXIMAL TO SPLENIC FLEXURE; DIAGNOSTIC, W/WO COLLECTION SPECIMEN BY BRUSH OR WASH;  Surgeon: Dewaine Conger, MD;  Location: [facility] GI PROCEDURES [place];  Service: Gastroenterology    PR RECONSTR TOTAL SHOULDER IMPLANT Left 04/08/2018    Procedure: ARTHROPLASTY, GLENOHUMERAL JOINT; TOTAL SHOULDER(GLENOID & PROXIMAL HUMERAL REPLACEMENT(EG, TOTAL SHOULDER);  Surgeon: Tomasa Rand, MD;  Location: [facility] OR [facility];  Service: Ortho Sports Medicine    PR SIGMOIDOSCOPY,BIOPSY N/A 03/11/2015    Procedure: SIGMOIDOSCOPY, FLEXIBLE; WITH BIOPSY, SINGLE OR MULTIPLE;  Surgeon: Wilburt Finlay, MD;  [facility]: [facility];  Service: Gastroenterology    PR XCAPSL CTRC RMVL INSJ IO LENS PROSTH W/O ECP Left 06/05/2022    Procedure: EXTRACAPSULAR CATARACT REMOVAL W/INSERTION OF INTRAOCULAR LENS PROSTHESIS, MANUAL OR MECHANICAL TECHNIQUE WITHOUT ENDOSCOPIC CYCLOPHOTOCOAGULATION;  Surgeon: Garner Gavel, MD;  Location: [facility] OR [facility];  Service: Ophthalmology    PR XCAPSL CTRC RMVL INSJ IO LENS PROSTH W/O ECP Right 06/19/2022    Procedure: EXTRACAPSULAR CATARACT REMOVAL W/INSERTION OF INTRAOCULAR LENS PROSTHESIS, MANUAL OR MECHANICAL TECHNIQUE WITHOUT ENDOSCOPIC CYCLOPHOTOCOAGULATION;  Surgeon: Garner Gavel, MD;  Location: [facility] OR [facility];  Service: Ophthalmology       Medication:  Current Facility-Administered Medications   Medication Dose Route Frequency Provider Last Rate Last Admin    acetaminophen (OFIRMEV) 10 mg/mL injection 650 mg 65 mL  650 mg Intravenous Q8H Gracelyn Nurse L, NP   Stopped at 02/29/24 1005    [Transfer Hold] albuterol 2.5 mg /3 mL (0.083 %) nebulizer solution 2.5 mg  2.5 mg Nebulization Q6H PRN Romie Jumper, FNP   2.5 mg at 02/28/24 1322    [Provider Hold] aspirin chewable tablet 81 mg  81 mg Oral Daily Concha Pyo, MD PhD   81 mg at 02/28/24 0912    [Provider Hold] atorvastatin (LIPITOR) tablet 40 mg  40 mg Oral Daily Concha Pyo, MD PhD   40 mg at 02/28/24 0912    [Provider Hold] cyclobenzaprine (FLEXERIL) tablet 5 mg  5 mg Oral BID PRN Concha Pyo, MD PhD        Atlee Abide Hold] dextrose (D10W) 10% bolus 125 mL  12.5 g Intravenous Q10 Min PRN Concha Pyo, MD PhD        [Provider Hold] dilTIAZem (CARDIZEM CD) 24 hr capsule 120 mg  120 mg Oral Daily Newt Minion [place], FNP   120 mg at 02/28/24 0912    [Transfer Hold] fentaNYL (PF) (SUBLIMAZE) injection 25 mcg  25 mcg Intravenous Q30 Min PRN Drema Balzarine S, [place]        Or    [Transfer Hold] fentaNYL (PF) (SUBLIMAZE) injection 50 mcg  50 mcg Intravenous Q30 Min PRN Maryann Alar, PA   50 mcg at 02/28/24 1551    fentaNYL PF (SUBLIMAZE) (50 mcg/mL) infusion (bag)  0-200 mcg/hr Intravenous Continuous Drema Balzarine S, PA 0.6 mL/hr at 02/29/24 1000 30 mcg/hr at 02/29/24 1000    [Provider Hold] fluPHENAZine decanoate (PROLIXIN) injection 50 mg  50 mg Intramuscular Q30 Days Concha Pyo, MD PhD        [Provider Hold] furosemide (LASIX) tablet 20 mg  20 mg Oral Daily Concha Pyo, MD PhD        [Provider Hold] gabapentin (NEURONTIN) capsule 100 mg  100 mg Oral TID Concha Pyo, MD PhD        Atlee Abide Hold] glucagon injection 1 mg  1 mg Intramuscular Once PRN Concha Pyo, MD PhD        heparin (porcine) 5,000 unit/mL injection 5,000 Units  5,000 Units Subcutaneous [facility] Lala Lund, ACNP   5,000 Units at 02/29/24 9528    HYDROmorphone (PF) (DILAUDID) injection Syrg 0.5 mg  0.5 mg Intravenous Q4H PRN Gracelyn Nurse L, NP        Or    HYDROmorphone (DILAUDID) injection 1 mg  1 mg Intravenous Q4H PRN Clide Cliff, NP        [Transfer Hold] HYDROmorphone (PF) (DILAUDID) injection Syrg 0.2 mg  0.2 mg Intravenous Q2H PRN Concha Pyo, MD PhD        Atlee Abide Hold] insulin lispro (HumaLOG) injection 0-20 Units  0-20 Units Subcutaneous Q4H [facility] Concha Pyo, MD PhD        Atlee Abide Hold] nicotine polacrilex (NICORETTE) gum 4 mg  4 mg Buccal Q1H PRN Concha Pyo, MD PhD        Or    Atlee Abide Hold] nicotine polacrilex (NICORETTE) lozenge 4 mg  4 mg Buccal Q1H PRN Concha Pyo, MD PhD        NORepinephrine 8 mg in dextrose 5 % 250 mL (32 mcg/mL) infusion PMB  0-10 mcg/min Intravenous Continuous Lala Lund, ACNP 3.8 mL/hr at 02/29/24 1140 2 mcg/min at 02/29/24 1140    [Transfer Hold] ondansetron (ZOFRAN) injection 4 mg  4 mg Intravenous Q6H PRN Adele Dan Hobgood, AGNP        oxyCODONE (ROXICODONE) immediate release tablet 5 mg  5 mg Oral Q4H PRN Clide Cliff, NP        Or    oxyCODONE (ROXICODONE) immediate release tablet 10 mg  10 mg Oral Q4H PRN Clide Cliff, NP        pantoprazole (Protonix) injection 40 mg  40 mg Intravenous Daily Lala Lund, ACNP   40 mg at 02/29/24 0917    piperacillin-tazobactam (ZOSYN) 3.375 g in sodium chloride 0.9 % (NS) 100 mL IVPB-MBP  3.375 g Intravenous Q12H Lala Lund, ACNP        propofol (DIPRIVAN) infusion 10 mg/mL  0-50 mcg/kg/min Intravenous Continuous Lala Lund, ACNP   Paused at 02/29/24 4132    [Transfer Hold] umeclidinium (INCRUSE ELLIPTA) 62.5 mcg/actuation inhaler 1 puff  1 puff Inhalation Daily (RT) Concha Pyo, MD PhD   1 puff at 02/28/24 1236     Facility-Administered Medications Ordered in Other Encounters   Medication Dose Route Frequency Provider Last Rate Last Admin    EPINEPHrine 100 mcg/10 mL (10 mcg/mL) injection   Intravenous PRN (once a day) Ichite, Precious C, MD   10 mcg at 02/28/24 1511    Propofol (DIPRIVAN) injection   Intravenous PRN (once a day) Ichite, Precious C, MD   60 mg at 02/28/24 1507    succinylcholine (ANECTINE) injection   Intravenous PRN (once a day) Ichite, Precious C, MD   60 mg at 02/28/24 1507       Allergies:  Allergies   Allergen Reactions    Lisinopril Anaphylaxis and Swelling    Losartan Dizziness    Hctz [Hydrochlorothiazide]      SIADH       Social History:  Social History     Tobacco Use    Smoking status: Every Day  Current packs/day: 1.00     Average packs/day: 1 pack/day for 65.9 years (65.9 ttl pk-yrs)     Types: Cigarettes     Start date: 04/09/1958    Smokeless tobacco: Never    Tobacco comments:     15cpd   Vaping Use    Vaping status: Never Used   Substance Use Topics    Alcohol use: No     Alcohol/week: 0.0 standard drinks of alcohol    Drug use: No       Family History:  Family History   Problem Relation Age of Onset    Breast cancer Daughter 42    Diabetes Mother     Glaucoma Father     Colon cancer Neg Hx     Ovarian cancer Neg Hx     Endometrial cancer Neg Hx     Anesthesia problems Neg Hx     Bleeding Disorder Neg Hx        Review of Systems:  10 systems were reviewed and are negative except as noted specifically in the HPI.    Objective:     Intake/Output last 3 shifts:  I/O last 3 completed shifts:  In: 5029.3 [I.V.:2940.9; NG/GT:10; IV Piggyback:2078.3]  Out: 1075 [Urine:435; Emesis/NG output:90; Blood:200]  Vital signs in last 24 hours:  BP 108/78  - Pulse 141  - Temp (!) 38.4 ??C (101.2 ??F) (Oral)  - Resp 22  - Ht 154.9 cm (5' 0.98)  - Wt 65.1 kg (143 lb 8.3 oz)  - SpO2 100%  - BMI 27.13 kg/m??     Physical Exam:  General:  Critically ill appearing  HEENT: ETT in place, NGT in place  Neck  Trachea midline, symmetrical  Lungs:   Ventilated on PSV  Cardiac: Tachycardic, in afib  Abdomen: Distended, abthera in place,  GU:   Ileal conduit, stoma is pink, no longer prolapsed, foley in place, straw colored urine in ostomy bag  Extremities: Warm and well perfused  Neuro:             intubated, sedated      Most Recent Labs:  Recent Labs     Units 02/28/24  1758 02/28/24  2204 02/29/24  0435   WBC 10*9/L 14.1* 5.8 13.1*   RBC 10*12/L 3.99 4.23 4.18   HGB g/dL 16.1* 09.6 04.5   HCT % 35.0 36.9 36.7   MCV fL 87.6 87.0 87.9   MCH pg 27.7 27.9 27.7   MCHC g/dL 40.9* 81.1 91.4*   RDW % 17.7* 18.2* 18.2*   PLT 10*9/L 221 244 237   MPV fL 8.3 8.6 8.5     Recent Labs     Units 02/28/24  1758 02/28/24  1830 02/28/24  2204 02/29/24  0435 02/29/24  0841 02/29/24  1117   NA mmol/L 140   < > 142 - 141 142 142 140   K mmol/L 4.7   < > 4.6 - 4.4 4.9* 5.0* 4.8*   CL mmol/L 103  --  104 105 106  --    CO2 mmol/L 21.0  --  20.0 19.0* 18.0*  --    BUN mg/dL 59*  --  65* 63* 61*  --    CREATININE mg/dL 7.82*  --  9.56* 2.13* 2.54*  --    GLU mg/dL 086  --  578 469 629*  --    MG mg/dL 2.8*  --  2.9* 2.7*  --   --     < > =  values in this interval not displayed.     Recent Labs     Units 02/26/24  1914   ALT U/L 20   AST U/L 31   ALKPHOS U/L 101   ALBUMIN g/dL 4.5   PROT g/dL 9.2*   BILITOT mg/dL 0.5     No results for input(s): INR, APTT in the last 168 hours.    Microbiology Data:  No results found for: LABBLOO  Urine Culture, Comprehensive   Date Value Ref Range Status   02/26/2024 (A)  Final    Mixed Gram Positive/Gram Negative Organisms Isolated   05/10/2022 >100,000 CFU/mL Escherichia coli (A)  Final   05/10/2022 50,000 to 100,000 CFU/mL Pseudomonas aeruginosa (A)  Final     Comment:     Susceptibility Testing By Consultation Only   05/10/2022 50,000 to 100,000 CFU/mL Mixed Urogenital Flora (A)  Final   06/22/2020 (A)  Final    Mixed Gram Positive/Gram Negative Organisms Isolated   02/16/2020 (A)  Final    Mixed Gram Positive/Gram Negative Organisms Isolated   11/26/2016 >100,000 CFU/mL Klebsiella pneumoniae (A)  Final   11/26/2016 10,000 to 50,000 CFU/mL Pseudomonas aeruginosa (A)  Final     Comment:     Susceptibility Testing By Consultation Only   08/02/2013 (A)  Final    30,000 CFU/ML MIXED UROGENITAL FLORA  Reference Range: (Lower detectable limit 1000 cfu/ml)  (Lower detectable limit for Acute dysuria, Suprapubic tap,  and Post-prostatic massage specimen types = 100 cfu/ml)   08/02/2013   Final    >100,000 CFU/mL  Testing results predict SUSCEPTIBILITY for the oral agents  cefdinir, cefuroxime, and cephalexin when used for therapy of  uncomplicated UTIs due to this organism.   07/07/2013   Final    MIXED GRAM POSITIVE/NEGATIVE ORGANISMS ISOLATED  Reference Range: (Lower detectable limit 1000 cfu/ml)  (Lower detectable limit for Acute dysuria, Suprapubic tap,  and Post-prostatic massage specimen types = 100 cfu/ml)   09/17/2011 FINAL  MIXED UROGENITAL FLORA  Final     Comment:     Reference Range: (Lower detectable limit 1000 cfu/ml)  (Lower detectable limit for Acute dysuria, Suprapubic tap,  and Post-prostatic massage specimen types = 100 cfu/ml)   03/23/2011 FINAL  MIXED UROGENITAL FLORA  Final     Comment:     Reference Range: (Lower detectable limit 1000 cfu/ml)  (Lower detectable limit for Acute dysuria, Suprapubic tap,  and Post-prostatic massage specimen types = 100 cfu/ml)   03/18/2011 FINAL  NO GROWTH  Final Comment:     Reference Range: (Lower detectable limit 1000 cfu/ml)  (Lower detectable limit for Acute dysuria, Suprapubic tap,  and Post-prostatic massage specimen types = 100 cfu/ml)     No results found for: ANACX  Most recent Urinalysis:  Recent Labs     Units 02/26/24  2313   LEUKOCYTESUR  Moderate*   NITRITE  Negative   RBCUA /HPF 42*   WBCUA /HPF 71*   SQUEPIU /HPF 3   BACTERIA /HPF Moderate*      Urinalysis History:  Leukocyte Esterase, UA   Date Value Ref Range Status   02/26/2024 Moderate (A) Negative Final   05/10/2022 Large (A) Negative Final   06/22/2020 Moderate (A) Negative Final   02/16/2020 Moderate (A) Negative Final   11/26/2016 Moderate (A) Negative Final   08/02/2013 +1 NEGATIVE Final   07/07/2013 +2 NEGATIVE Final   09/17/2011 +2 NEGATIVE Final   03/18/2011 NEGATIVE NEGATIVE Final     Nitrite, UA  Date Value Ref Range Status   02/26/2024 Negative Negative Final   05/10/2022 Positive (A) Negative Final   06/22/2020 Negative Negative Final   02/16/2020 Positive (A) Negative Final   11/26/2016 Negative Negative Final   08/02/2013 NEGATIVE NEGATIVE Final   07/07/2013 NEGATIVE NEGATIVE Final   09/17/2011 NEGATIVE NEGATIVE Final   03/18/2011 NEGATIVE NEGATIVE Final     RBC, UA   Date Value Ref Range Status   02/26/2024 42 (H) <=4 /HPF Final   05/10/2022 4 <=4 /HPF Final   06/22/2020 2 <=4 /HPF Final   02/16/2020 3 <4 /HPF Final   11/26/2016 21 (H) <4 /HPF Final     WBC, UA   Date Value Ref Range Status   02/26/2024 71 (H) 0 - 5 /HPF Final   05/10/2022 6 (H) 0 - 5 /HPF Final   06/22/2020 25 (H) 0 - 5 /HPF Final   02/16/2020 10 (H) 0 - 5 /HPF Final   11/26/2016 12 (H) 0 - 5 /HPF Final     Squam Epithel, UA   Date Value Ref Range Status   02/26/2024 3 0 - 5 /HPF Final   05/10/2022 1 0 - 5 /HPF Final   06/22/2020 <1 0 - 5 /HPF Final   02/16/2020 2 0 - 5 /HPF Final   11/26/2016 <1 0 - 5 /HPF Final   09/17/2011 3 /HPF Final   03/18/2011 <1 /HPF Final     Bacteria, UA   Date Value Ref Range Status 02/26/2024 Moderate (A) None Seen /HPF Final   05/10/2022 Few (A) None Seen /HPF Final   06/22/2020 Many (A) None Seen /HPF Final   02/16/2020 Few (A) None Seen /HPF Final   11/26/2016 Many (A) None Seen /HPF Final   08/02/2013 FEW  Final   07/07/2013 MANY  Final   03/18/2011 RARE  Final        Imaging:  XR Chest Portable  Result Date: 02/29/2024  EXAM: XR CHEST PORTABLE ACCESSION: [id number] UN REPORT DATE: 02/29/2024 6:22 AM     CLINICAL INDICATION: VENTILATOR/RESPIRATOR DEPENDENCE STATUS      TECHNIQUE: Single View AP Chest Radiograph.     COMPARISON: Chest 02/28/2024 at 5:30 p.m.     FINDINGS:     HARDWARE: - The tip of the right IJ approach central venous catheter terminates over the right atrium - Nasogastric tube traverses the anticipated proximal esophageal course and likely terminates within the stomach - Endotracheal tube terminates approximately 3 cm cephalad to the carina     HEART &  MEDIASTINUM: Similar borderline prominent cardiac silhouette. As better demonstrated on CT imaging, large amount of abdominal contents have herniated into the caudal aspect of the thorax.     PULMONARY/PLEURAL SPACE:  Low lung volumes with associated bibasilar atelectasis. To mass effect associated with large hernia as well as small pleural effusions also contributes to atelectasis within the lung bases. Furthermore, some element of consolidation (pneumonia, aspiration, etc.) in the lung bases is quite likely.                 Consider withdrawal of the right IJ central venous catheter 3 cm for more typical positioning near the superior cavoatrial junction.    ECG 12 Lead  Result Date: 02/28/2024  ATRIAL FLUTTER WITH VARIABLE A-V BLOCK RIGHTWARD AXIS SEPTAL INFARCT  (CITED ON OR BEFORE 21-Sep-2020) ABNORMAL ECG WHEN COMPARED WITH ECG OF 27-Feb-2024 10:16, ATRIAL FLUTTER HAS REPLACED SINUS RHYTHM ST NO LONGER ELEVATED IN INFERIOR LEADS NONSPECIFIC T WAVE  ABNORMALITY, IMPROVED IN LATERAL LEADS Confirmed by Warnell Forester [phone]) on 02/28/2024 9:52:26 PM    XR Chest Portable  Result Date: 02/28/2024  EXAM: XR CHEST PORTABLE DATE: 02/28/2024 5:54 PM ACCESSION: [id number] UN DICTATED: 02/28/2024 5:59 PM INTERPRETATION LOCATION: MAIN CAMPUS     CLINICAL INDICATION: 83 years old Female with LINE CHECK (CATHETER VASCULAR FIT)      COMPARISON: Comparison is made to multiple prior chest radiographs, the most recent from 0439 hours.     TECHNIQUE: Portable chest radiograph         FINDINGS:     HARDWARE: - The tip of the right IJ approach central venous catheter terminates over the right atrium - Nasogastric tube traverses the anticipated proximal esophageal course and likely terminates within the intrathoracic aspect of the stomach - Endotracheal tube terminates approximately 3 cm cephalad to the carina     HEART &  MEDIASTINUM: Similar borderline prominent cardiac silhouette. As better demonstrated on CT imaging, large amount of abdominal contents have herniated into the caudal aspect of the thorax.     PULMONARY/PLEURAL SPACE:  Low lung volumes with associated bibasilar atelectasis. To mass effect associated with large hernia as well as small pleural effusions also contributes to atelectasis within the lung bases. Furthermore, some element of consolidation (pneumonia, aspiration, etc.) in the lung bases is quite likely.                 Consider withdrawal of the right IJ central venous catheter 3 cm for more typical positioning near the superior cavoatrial junction.                     XR Chest Portable  Result Date: 02/28/2024  EXAM: XR CHEST PORTABLE ACCESSION: [id number] UN REPORT DATE: 02/28/2024 2:22 PM     CLINICAL INDICATION: shortness of breath, tachycardia ; OTHER      TECHNIQUE: Single View AP Chest Radiograph.     COMPARISON: CT abdomen 02/26/2024 and chest radiograph 02/26/2024     FINDINGS:     New NG/OG tube within large hiatal hernia.     Bilateral lower lung airspace consolidation. No pleural effusion or pneumothorax.     Cardiac silhouette is normal in size. Thoracic aorta with calcifications.             *  New NG/OG tube and large hiatal hernia. *  Bibasilar atelectasis/airspace disease.    ECG 12 Lead  Result Date: 02/28/2024  ATRIAL FIBRILLATION WITH RAPID VENTRICULAR RESPONSE SEPTAL INFARCT  (CITED ON OR BEFORE 21-Sep-2020) ABNORMAL ECG WHEN COMPARED WITH ECG OF 27-Feb-2024 21:23, ATRIAL FIBRILLATION HAS REPLACED ATRIAL FLUTTER NONSPECIFIC T WAVE ABNORMALITY, WORSE IN LATERAL LEADS    XR Chest Portable  Result Date: 02/28/2024  EXAM: XR CHEST PORTABLE ACCESSION: [id number] UN REPORT DATE: 02/28/2024 9:42 AM     CLINICAL INDICATION: increase in oxgyen requirements ; OTHER      TECHNIQUE: Single View AP Chest Radiograph.     COMPARISON: 02/27/2024     FINDINGS:     Unchanged enteric tube entering stomach.     Large right hiatal hernia with compressive atelectasis of the right lung. Left basilar atelectasis. No pleural effusion or pneumothorax.     Cardiac silhouette is normal in size. Thoracic aorta with calcifications. Partially visualized left shoulder arthroplasty.             Large right complex hiatal hernia with associated atelectasis.

## 2024-02-29 NOTE — Unmapped (Signed)
 Pt currently on PSV. ETT patent and secure. Small, clear/white sx noted. Breath sounds rhonchi/diminished. All emergency supplies present at [facility].

## 2024-03-01 LAB — BASIC METABOLIC PANEL
ANION GAP: 13 mmol/L (ref 5–14)
ANION GAP: 14 mmol/L (ref 5–14)
ANION GAP: 13 mmol/L (ref 5–14)
BLOOD UREA NITROGEN: 72 mg/dL — ABNORMAL HIGH (ref 9–23)
BLOOD UREA NITROGEN: 66 mg/dL — ABNORMAL HIGH (ref 9–23)
BLOOD UREA NITROGEN: 60 mg/dL — ABNORMAL HIGH (ref 9–23)
BUN / CREAT RATIO: 25
BUN / CREAT RATIO: 28
BUN / CREAT RATIO: 25
CALCIUM: 8.4 mg/dL — ABNORMAL LOW (ref 8.7–10.4)
CALCIUM: 8.1 mg/dL — ABNORMAL LOW (ref 8.7–10.4)
CALCIUM: 8.2 mg/dL — ABNORMAL LOW (ref 8.7–10.4)
CHLORIDE: 105 mmol/L (ref 98–107)
CHLORIDE: 105 mmol/L (ref 98–107)
CHLORIDE: 106 mmol/L (ref 98–107)
CO2: 23 mmol/L (ref 20.0–31.0)
CO2: 24 mmol/L (ref 20.0–31.0)
CO2: 22 mmol/L (ref 20.0–31.0)
CREATININE: 2.84 mg/dL — ABNORMAL HIGH (ref 0.55–1.02)
CREATININE: 2.39 mg/dL — ABNORMAL HIGH (ref 0.55–1.02)
CREATININE: 2.37 mg/dL — ABNORMAL HIGH (ref 0.55–1.02)
EGFR CKD-EPI (2021) FEMALE: 16 mL/min/{1.73_m2} — ABNORMAL LOW (ref >=60)
EGFR CKD-EPI (2021) FEMALE: 20 mL/min/{1.73_m2} — ABNORMAL LOW (ref >=60)
EGFR CKD-EPI (2021) FEMALE: 20 mL/min/{1.73_m2} — ABNORMAL LOW (ref >=60)
GLUCOSE RANDOM: 176 mg/dL — ABNORMAL HIGH (ref 70–99)
GLUCOSE RANDOM: 163 mg/dL — ABNORMAL HIGH (ref 70–99)
GLUCOSE RANDOM: 170 mg/dL (ref 70–179)
POTASSIUM: 4.9 mmol/L — ABNORMAL HIGH (ref 3.4–4.8)
POTASSIUM: 4.8 mmol/L (ref 3.4–4.8)
POTASSIUM: 4.6 mmol/L (ref 3.4–4.8)
SODIUM: 141 mmol/L (ref 135–145)
SODIUM: 143 mmol/L (ref 135–145)
SODIUM: 141 mmol/L (ref 135–145)

## 2024-03-01 LAB — CBC
HEMATOCRIT: 33.5 % — ABNORMAL LOW (ref 34.0–44.0)
HEMATOCRIT: 32.3 % — ABNORMAL LOW (ref 34.0–44.0)
HEMATOCRIT: 35.2 % (ref 34.0–44.0)
HEMOGLOBIN: 11 g/dL — ABNORMAL LOW (ref 11.3–14.9)
HEMOGLOBIN: 10.4 g/dL — ABNORMAL LOW (ref 11.3–14.9)
HEMOGLOBIN: 11.4 g/dL (ref 11.3–14.9)
MEAN CORPUSCULAR HEMOGLOBIN CONC: 33 g/dL (ref 32.0–36.0)
MEAN CORPUSCULAR HEMOGLOBIN CONC: 32.3 g/dL (ref 32.0–36.0)
MEAN CORPUSCULAR HEMOGLOBIN CONC: 32.4 g/dL (ref 32.0–36.0)
MEAN CORPUSCULAR HEMOGLOBIN: 28.6 pg (ref 25.9–32.4)
MEAN CORPUSCULAR HEMOGLOBIN: 27.9 pg (ref 25.9–32.4)
MEAN CORPUSCULAR HEMOGLOBIN: 28.1 pg (ref 25.9–32.4)
MEAN CORPUSCULAR VOLUME: 86.8 fL (ref 77.6–95.7)
MEAN CORPUSCULAR VOLUME: 86.5 fL (ref 77.6–95.7)
MEAN CORPUSCULAR VOLUME: 86.8 fL (ref 77.6–95.7)
MEAN PLATELET VOLUME: 8.4 fL (ref 6.8–10.7)
MEAN PLATELET VOLUME: 8.5 fL (ref 6.8–10.7)
MEAN PLATELET VOLUME: 8.4 fL (ref 6.8–10.7)
PLATELET COUNT: 189 10*9/L (ref 150–450)
PLATELET COUNT: 182 10*9/L (ref 150–450)
PLATELET COUNT: 199 10*9/L (ref 150–450)
RED BLOOD CELL COUNT: 3.86 10*12/L — ABNORMAL LOW (ref 3.95–5.13)
RED BLOOD CELL COUNT: 3.74 10*12/L — ABNORMAL LOW (ref 3.95–5.13)
RED BLOOD CELL COUNT: 4.06 10*12/L (ref 3.95–5.13)
RED CELL DISTRIBUTION WIDTH: 17.9 % — ABNORMAL HIGH (ref 12.2–15.2)
RED CELL DISTRIBUTION WIDTH: 18.3 % — ABNORMAL HIGH (ref 12.2–15.2)
RED CELL DISTRIBUTION WIDTH: 18.4 % — ABNORMAL HIGH (ref 12.2–15.2)
WBC ADJUSTED: 10.9 10*9/L (ref 3.6–11.2)
WBC ADJUSTED: 10.7 10*9/L (ref 3.6–11.2)
WBC ADJUSTED: 10.9 10*9/L (ref 3.6–11.2)

## 2024-03-01 LAB — BLOOD GAS CRITICAL CARE PANEL, ARTERIAL
BASE EXCESS ARTERIAL: -4.6 — ABNORMAL LOW (ref -2.0–2.0)
BASE EXCESS ARTERIAL: -4.5 — ABNORMAL LOW (ref -2.0–2.0)
CALCIUM IONIZED ARTERIAL (MG/DL): 4.58 mg/dL (ref 4.40–5.40)
CALCIUM IONIZED ARTERIAL (MG/DL): 4.47 mg/dL (ref 4.40–5.40)
GLUCOSE WHOLE BLOOD: 163 mg/dL (ref 70–179)
GLUCOSE WHOLE BLOOD: 158 mg/dL (ref 70–179)
HCO3 ARTERIAL: 23 mmol/L (ref 22–27)
HCO3 ARTERIAL: 24 mmol/L (ref 22–27)
HEMOGLOBIN BLOOD GAS: 17.4 g/dL — ABNORMAL HIGH (ref 12.00–16.00)
HEMOGLOBIN BLOOD GAS: 12.3 g/dL (ref 12.00–16.00)
LACTATE BLOOD ARTERIAL: 1.4 mmol/L — ABNORMAL HIGH (ref <1.3)
LACTATE BLOOD ARTERIAL: 1.3 mmol/L — ABNORMAL HIGH (ref <1.3)
O2 SATURATION ARTERIAL: 98 % (ref 94.0–100.0)
O2 SATURATION ARTERIAL: 75.1 % — ABNORMAL LOW (ref 94.0–100.0)
PCO2 ARTERIAL: 50.2 mmHg — ABNORMAL HIGH (ref 35.0–45.0)
PCO2 ARTERIAL: 56.7 mmHg — ABNORMAL HIGH (ref 35.0–45.0)
PH ARTERIAL: 7.27 — ABNORMAL LOW (ref 7.35–7.45)
PH ARTERIAL: 7.23 — ABNORMAL LOW (ref 7.35–7.45)
PO2 ARTERIAL: 117 mmHg — ABNORMAL HIGH (ref 80.0–110.0)
PO2 ARTERIAL: 47.4 mmHg — ABNORMAL LOW (ref 80.0–110.0)
POTASSIUM WHOLE BLOOD: 4.4 mmol/L (ref 3.4–4.6)
POTASSIUM WHOLE BLOOD: 4.3 mmol/L (ref 3.4–4.6)
SODIUM WHOLE BLOOD: 139 mmol/L (ref 135–145)
SODIUM WHOLE BLOOD: 141 mmol/L (ref 135–145)

## 2024-03-01 LAB — PHOSPHORUS
PHOSPHORUS: 5.7 mg/dL — ABNORMAL HIGH (ref 2.4–5.1)
PHOSPHORUS: 5.4 mg/dL — ABNORMAL HIGH (ref 2.4–5.1)

## 2024-03-01 LAB — MAGNESIUM
MAGNESIUM: 2.9 mg/dL — ABNORMAL HIGH (ref 1.6–2.6)
MAGNESIUM: 2.9 mg/dL — ABNORMAL HIGH (ref 1.6–2.6)
MAGNESIUM: 3 mg/dL — ABNORMAL HIGH (ref 1.6–2.6)

## 2024-03-01 LAB — TSH: THYROID STIMULATING HORMONE: 3.123 u[IU]/mL (ref 0.550–4.780)

## 2024-03-01 LAB — ALT: ALT (SGPT): 12 U/L (ref 10–49)

## 2024-03-01 LAB — AST: AST (SGOT): 12 U/L (ref <=34)

## 2024-03-01 MED ADMIN — heparin (porcine) 5,000 unit/mL injection 5,000 Units: 5000 [IU] | SUBCUTANEOUS | @ 11:00:00

## 2024-03-01 MED ADMIN — ipratropium (ATROVENT) 0.02 % nebulizer solution 500 mcg: 500 ug | RESPIRATORY_TRACT | @ 20:00:00

## 2024-03-01 MED ADMIN — budesonide (PULMICORT) nebulizer solution 0.5 mg: .5 mg | RESPIRATORY_TRACT | @ 16:00:00

## 2024-03-01 MED ADMIN — piperacillin-tazobactam (ZOSYN) 3.375 g in sodium chloride 0.9 % (NS) 100 mL IVPB-MBP: 3.375 g | INTRAVENOUS | @ 18:00:00 | Stop: 2024-03-04

## 2024-03-01 MED ADMIN — amiodarone 360 mg in dextrose 5% 200 mL (1.8 mg/mL) infusion PMB: .5 mg/min | INTRAVENOUS | @ 01:00:00

## 2024-03-01 MED ADMIN — electrolyte-A (PLASMA-LYT A) infusion: INTRAVENOUS | @ 23:00:00 | Stop: 2024-03-01

## 2024-03-01 MED ADMIN — electrolyte-A (PLASMA-LYT A) infusion: 500 mL | INTRAVENOUS | @ 21:00:00 | Stop: 2024-03-01

## 2024-03-01 MED ADMIN — electrolyte-A (PLASMA-LYT A) infusion: 500 mL | INTRAVENOUS | @ 23:00:00 | Stop: 2024-03-01

## 2024-03-01 MED ADMIN — phenylephrine 1 mg/10 mL (100 mcg/mL) injection Syrg: INTRAVENOUS | @ 23:00:00 | Stop: 2024-03-01

## 2024-03-01 MED ADMIN — ROCuronium (ZEMURON) injection: INTRAVENOUS | Stop: 2024-03-01

## 2024-03-01 MED ADMIN — fentaNYL PF (SUBLIMAZE) (50 mcg/mL) infusion (bag): 0-200 ug/h | INTRAVENOUS | @ 16:00:00 | Stop: 2024-03-13

## 2024-03-01 MED ADMIN — fentaNYL (PF) (SUBLIMAZE) injection: INTRAVENOUS | @ 23:00:00 | Stop: 2024-03-01

## 2024-03-01 MED ADMIN — piperacillin-tazobactam (ZOSYN) 3.375 g in sodium chloride 0.9 % (NS) 100 mL IVPB-MBP: 3.375 g | INTRAVENOUS | @ 06:00:00 | Stop: 2024-03-04

## 2024-03-01 MED ADMIN — amiodarone 360 mg in dextrose 5% 200 mL (1.8 mg/mL) infusion PMB: .5 mg/min | INTRAVENOUS | @ 11:00:00

## 2024-03-01 MED ADMIN — lactated ringers bolus 500 mL: 500 mL | INTRAVENOUS | @ 10:00:00 | Stop: 2024-03-01

## 2024-03-01 MED ADMIN — lactated ringers bolus 500 mL: 500 mL | INTRAVENOUS | @ 14:00:00 | Stop: 2024-03-01

## 2024-03-01 MED ADMIN — amiodarone in dextrose,iso-osm (NEXTERONE) 150 mg/100 mL (1.5 mg/mL) IVPB 150 mg: 150 mg | INTRAVENOUS | @ 15:00:00 | Stop: 2024-03-01

## 2024-03-01 MED ADMIN — pantoprazole (Protonix) injection 40 mg: 40 mg | INTRAVENOUS | @ 15:00:00

## 2024-03-01 MED ADMIN — heparin (porcine) 5,000 unit/mL injection 5,000 Units: 5000 [IU] | SUBCUTANEOUS | @ 03:00:00

## 2024-03-01 MED ADMIN — heparin (porcine) 5,000 unit/mL injection 5,000 Units: 5000 [IU] | SUBCUTANEOUS | @ 18:00:00

## 2024-03-01 MED ADMIN — ROCuronium (ZEMURON) injection: INTRAVENOUS | @ 23:00:00 | Stop: 2024-03-01

## 2024-03-01 MED ADMIN — amiodarone 360 mg in dextrose 5% 200 mL (1.8 mg/mL) infusion PMB: .5 mg/min | INTRAVENOUS | @ 20:00:00

## 2024-03-01 MED ADMIN — acetaminophen (OFIRMEV) 10 mg/mL injection 1,000 mg: 1000 mg | INTRAVENOUS | @ 15:00:00 | Stop: 2024-03-02

## 2024-03-01 MED ADMIN — phenylephrine 1 mg/10 mL (100 mcg/mL) injection Syrg: INTRAVENOUS | Stop: 2024-03-01

## 2024-03-01 MED ADMIN — acetaminophen (OFIRMEV) 10 mg/mL injection 650 mg 65 mL: 650 mg | INTRAVENOUS | @ 06:00:00 | Stop: 2024-03-01

## 2024-03-01 MED ADMIN — ipratropium (ATROVENT) 0.02 % nebulizer solution 500 mcg: 500 ug | RESPIRATORY_TRACT | @ 16:00:00

## 2024-03-01 NOTE — Unmapped (Signed)
 Operative Note  (CSN: [id number])      Date of Surgery: 03/01/2024    Pre-op Diagnosis: SBO small bowel obstruction    Post-op Diagnosis: same as above    CPT Code of Procedure(s) Performed:  Midline - REOPENING OF RECENT LAPAROTOMY    Procedures Performed:  1) Re-opening of recent laparotomy  2) Abdominal closure    Performing Service: Trauma  Surgeons and Role:     * [name]., MD - Primary    Anesthesia: General    Estimated Blood Loss: 2cc    Complications: None    Specimens: None collected    Findings: Diffusely dilated small bowel. Urostomy intact with negative leak test by Urology team. Oxygen desaturations with re-approximation of the fascia.    Operative details: The patient was identified in the SICU. The risks, benefits, and alternatives to the procedure were discussed and informed consent was obtained from the patient's family. The patient was transported to the OR and placed in supine position. An initial pre-operative timeout was performed verifying the patient, procedure, and surgical site. Anesthesia was induced. The abdomen was prepped and draped in standard sterile fashion. A second surgical timeout was performed.    We started by removing the Abthera wound vacuum. Adhesions were appreciated throughout and bluntly divided. We ran the small bowel from LOT to urostomy. The anastomosis appeared intact. Between the urostomy and small bowel anastomosis there was an area concerning for serosal injury that was too long to oversew; approximately 15cm. There was not any intra-abdominal contamination. The abdomen was copiously irrigated with sterile saline. Urology scrubbed in to interrogate the urostomy. There was no evidence of leak at the urostomy. Kochers were placed on the fascia and re-approximated to midline. Re-approximation was made challenging by degree of small bowel dilation and patient oxygen desaturations. Given patient's intermittent hypoxia and ongoing tachycardia the decision was made to replace the Abthera wound vacuum and transfer back to the ICU for ongoing diuresis and pulmonary optimization. The Abthera was placed in standard fashion and connected to a wound vacuum and noted to be holding suction. Urostomy appliance was replaced.    The patient tolerated the procedure well and was transferred back to the SICU intubted and in critical condition. All counts were correct.    Dr. Laural Benes was present for the entirety of the procedure.    Idalia Needle, MD  PGY-4 General Surgery    I was present for the entirety of the procedure(s). [name], MD

## 2024-03-01 NOTE — Unmapped (Signed)
 Vent Mode: PSV-CPAP  FiO2 (%): 40 %  PEEP: 8 cm H20  PR SUP: 5 cm H20    Recent Labs     Units 02/29/24  1117   PHART  7.28*   PCO2ART mm Hg 39.4   PO2ART mm Hg 140.0*   HCO3ART mmol/L 18*   BEART  -7.9*   O2SATART % 98.9     Problem: Mechanical Ventilation Invasive  Goal: Effective Communication  Outcome: Ongoing - Unchanged  Goal: Optimal Device Function  Outcome: Ongoing - Unchanged  Intervention: Optimize Device Care and Function  Recent Flowsheet Documentation  Taken 03/01/2024 2956 by Glenetta Hew, RRT  Airway/Ventilation Management:   airway patency maintained   calming measures promoted   humidification applied   pulmonary hygiene promoted  Taken 03/01/2024 0150 by Glenetta Hew, RRT  Airway/Ventilation Management:   airway patency maintained   calming measures promoted   humidification applied   pulmonary hygiene promoted  Oral Care: suction provided  Goal: Mechanical Ventilation Liberation  Outcome: Ongoing - Unchanged  Goal: Optimal Nutrition Delivery  Outcome: Ongoing - Unchanged  Goal: Absence of Device-Related Skin and Tissue Injury  Outcome: Ongoing - Unchanged  Goal: Absence of Ventilator-Induced Lung Injury  Outcome: Ongoing - Unchanged  Intervention: Prevent Ventilator-Associated Pneumonia  Recent Flowsheet Documentation  Taken 03/01/2024 0452 by Glenetta Hew, RRT  Head of Bed [facility]) Positioning: HOB at 30-45 degrees  VAP Prevention Bundle:   HOB elevation maintained   sedation interruption performed   spontaneous breathing trial performed  Taken 03/01/2024 0150 by Glenetta Hew, RRT  Head of Bed (HOB) Positioning: HOB at 30-45 degrees  VAP Prevention Bundle: HOB elevation maintained  Oral Care: suction provided     Problem: Artificial Airway  Goal: Effective Communication  Outcome: Ongoing - Unchanged  Goal: Optimal Device Function  Outcome: Ongoing - Unchanged  Intervention: Optimize Device Care and Function  Recent Flowsheet Documentation  Taken 03/01/2024 0452 by Glenetta Hew, RRT  Airway/Ventilation Management:   airway patency maintained   calming measures promoted   humidification applied   pulmonary hygiene promoted  Taken 03/01/2024 0150 by Glenetta Hew, RRT  Airway/Ventilation Management:   airway patency maintained   calming measures promoted   humidification applied   pulmonary hygiene promoted  Oral Care: suction provided  Goal: Absence of Device-Related Skin or Tissue Injury  Outcome: Ongoing - Unchanged

## 2024-03-01 NOTE — Unmapped (Signed)
 UROLOGY CONSULT NOTE    Requesting Attending Physician:  [name], MD  Service Requesting Consult:  [facility])  Service Providing Consult: SRU  Consulting Attending: Dr. Katrinka Blazing     Assessment:  Patient is a 83 y.o. female with hx of bladder cancer s/p RCIC on 10/05/2011 for T2N0M0 with known parastomal hernia (previously stable and reducible) who presented to [facility] with N/V and abdominal distention 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     On evaluation, patient is stable but requiring large bore [place]. Labs notable for WBC 11.4 (down from 19 at admission) and elevated Cr to 2.63 (baseline ~1). UA demonstrates moderate LE, 42 WBCs, moderate bacteria (expected to be dirty). Urine culture pending. CTAP with findings of SBO as described above. Also with stable mild BL hydronephrosis.    In setting of stable hydronephrosis, my concern for an obstruction is relatively low, and her AKI is more likely related to poor PO intake and emesis. However, we will continue to monitor Cr and consider additional imaging if renal function does not improve. Of note, last loopogram negative for stricture or obstruction (11/08/22).     Interval 3/01: patient is now s/p ex-lap LOA, small bowel resection fistula takedown with primary repair, left open. Tachycardia ON 120s-140s, MAPs improved following surgery. ON NE @ 10. UOP low @ ON likely in setting of shock. Cr worse this am 2.54 from 2.2 yesterday. Ostomy pink, viable and productive this am.     Interval 3/02: tachycardia ON in a fib. Maintaining MAPs now off pressors. Amiodarone started. No SBT this am. UOP downtrended to ( ) ON. Cr uptrending to 2.8.     Recommendations:  Continue foley catheter per stoma, if it falls out notify urology to replace  Urology will be available for planned take-back   We will continue to follow.     Thank you for this consult. Please page [phone] with any questions or concerns.    History of Present Illness: Tammy Braun is seen in consultation for SBO at the request of [name], MD on the [facility]).     Endorses 1 day history of worsening abdominal pain associated with N/V. Feels constipation and has not had BM in at least a day. Minimal PO intake.     Denies change in urine output. Does not feel like her urine has been malodourous or has not noted decreased output from ostomy.     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    Chronic kidney disease     Depression, psychotic (CMS-HCC)     Diabetes mellitus (CMS-HCC)     in past    Emphysema of lung (CMS-HCC)     Financial difficulties     Frail elderly     Hearing impairment     Hernia     History of transfusion     Hyperlipidemia     Hypertension     Impaired mobility     Osteoporosis     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/08/2015    Visual impairment        Past Surgical History:   Past Surgical History:   Procedure Laterality Date    ABDOMINAL  SURGERY      BLADDER SURGERY      BREAST CYST EXCISION      CHEMOTHERAPY  2012    bladder    GALLBLADDER SURGERY      stone removal    ILEOSTOMY  2012    PR COLONOSCOPY FLX DX W/COLLJ SPEC WHEN PFRMD N/A 02/09/2015    Procedure: COLONOSCOPY, FLEXIBLE, PROXIMAL TO SPLENIC FLEXURE; DIAGNOSTIC, W/WO COLLECTION SPECIMEN BY BRUSH OR WASH;  Surgeon: Dewaine Conger, MD;  Location: [facility];  Service: Gastroenterology    PR RECONSTR TOTAL SHOULDER IMPLANT Left 04/08/2018    Procedure: ARTHROPLASTY, GLENOHUMERAL JOINT; TOTAL SHOULDER(GLENOID & PROXIMAL HUMERAL REPLACEMENT(EG, TOTAL SHOULDER);  Surgeon: Tomasa Rand, MD;  Location: [facility] OR [facility];  Service: Ortho Sports Medicine    PR SIGMOIDOSCOPY,BIOPSY N/A 03/11/2015    Procedure: SIGMOIDOSCOPY, FLEXIBLE; WITH BIOPSY, SINGLE OR MULTIPLE;  Surgeon: Wilburt Finlay, MD; Location: [facility];  Service: Gastroenterology    PR XCAPSL CTRC RMVL INSJ IO LENS PROSTH W/O ECP Left 06/05/2022    Procedure: EXTRACAPSULAR CATARACT REMOVAL W/INSERTION OF INTRAOCULAR LENS PROSTHESIS, MANUAL OR MECHANICAL TECHNIQUE WITHOUT ENDOSCOPIC CYCLOPHOTOCOAGULATION;  Surgeon: Garner Gavel, MD;  Location: [facility] OR [facility];  Service: Ophthalmology    PR XCAPSL CTRC RMVL INSJ IO LENS PROSTH W/O ECP Right 06/19/2022    Procedure: EXTRACAPSULAR CATARACT REMOVAL W/INSERTION OF INTRAOCULAR LENS PROSTHESIS, MANUAL OR MECHANICAL TECHNIQUE WITHOUT ENDOSCOPIC CYCLOPHOTOCOAGULATION;  Surgeon: Garner Gavel, MD;  Location: [facility] OR [facility];  Service: Ophthalmology       Medication:  Current Facility-Administered Medications   Medication Dose Route Frequency Provider Last Rate Last Admin    acetaminophen (OFIRMEV) 10 mg/mL injection 1,000 mg  1,000 mg Intravenous Q8H Strong, Dan Humphreys, MD 400 mL/hr at 03/01/24 1024 1,000 mg at 03/01/24 1024    albuterol 2.5 mg /3 mL (0.083 %) nebulizer solution 2.5 mg  2.5 mg Nebulization Q6H PRN Oletha Blend B, MD   2.5 mg at 02/28/24 1322    amiodarone 360 mg in dextrose 5% 200 mL (1.8 mg/mL) infusion PMB  0.5 mg/min Intravenous Continuous Oletha Blend B, MD 16.7 mL/hr at 03/01/24 0546 0.5 mg/min at 03/01/24 0546    [Provider Hold] aspirin chewable tablet 81 mg  81 mg Oral Daily Oletha Blend B, MD   81 mg at 02/28/24 0912    [Provider Hold] atorvastatin (LIPITOR) tablet 40 mg  40 mg Oral Daily Oletha Blend B, MD   40 mg at 02/28/24 0912    budesonide (PULMICORT) nebulizer solution 0.5 mg  0.5 mg Nebulization BID (RT) Colvin Caroli, PA   0.5 mg at 03/01/24 1041    [Provider Hold] cyclobenzaprine (FLEXERIL) tablet 5 mg  5 mg Oral BID PRN Oletha Blend B, MD        dextrose (D10W) 10% bolus 125 mL  12.5 g Intravenous Q10 Min PRN Alexander Mt, MD        [Provider Hold] dilTIAZem (CARDIZEM CD) 24 hr capsule 120 mg  120 mg Oral Daily Festus Holts, Kian B, MD 120 mg at 02/28/24 0912    fentaNYL (PF) (SUBLIMAZE) injection 25 mcg  25 mcg Intravenous Q30 Min PRN Oletha Blend B, MD        Or    fentaNYL (PF) (SUBLIMAZE) injection 50 mcg  50 mcg Intravenous Q30 Min PRN Oletha Blend B, MD   50 mcg at 02/28/24 1551    fentaNYL PF (SUBLIMAZE) (50 mcg/mL) infusion (bag)  0-200 mcg/hr Intravenous Continuous Maryann Alar, [place] 0.5 mL/hr at 03/01/24 9563 25 mcg/hr at 03/01/24 0537    flu vaccine [id number] (66YR UP)(PF)(FLUZONE HIGH DOSE)  0.5 mL Intramuscular During hospitalization [name]., MD        [Provider Hold] fluPHENAZine decanoate (PROLIXIN) injection 50 mg  50 mg Intramuscular Q30 Days Alexander Mt, MD        [Provider Hold] furosemide (LASIX) tablet 20 mg  20 mg Oral Daily Alexander Mt, MD        [Provider Hold] gabapentin (NEURONTIN) capsule 100 mg  100 mg Oral TID Festus Holts, Kian B, MD        glucagon injection 1 mg  1 mg Intramuscular Once PRN Oletha Blend B, MD        heparin (porcine) 5,000 unit/mL injection 5,000 Units  5,000 Units Subcutaneous [facility] Lala Lund, ACNP   5,000 Units at 03/01/24 9518    HYDROmorphone (PF) (DILAUDID) injection Syrg 0.5 mg  0.5 mg Intravenous Q4H PRN Gracelyn Nurse L, NP        Or    HYDROmorphone (DILAUDID) injection 1 mg  1 mg Intravenous Q4H PRN Gracelyn Nurse L, NP        HYDROmorphone (PF) (DILAUDID) injection Syrg 0.2 mg  0.2 mg Intravenous Q2H PRN Oletha Blend B, MD        insulin lispro (HumaLOG) injection 0-20 Units  0-20 Units Subcutaneous Q4H [facility] Oletha Blend B, MD   1 Units at 02/29/24 1839    ipratropium (ATROVENT) 0.02 % nebulizer solution 500 mcg  500 mcg Nebulization Q6H (RT) Colvin Caroli, PA   500 mcg at 03/01/24 1042    nicotine polacrilex (NICORETTE) gum 4 mg  4 mg Buccal Q1H PRN Alexander Mt, MD        Or    nicotine polacrilex (NICORETTE) lozenge 4 mg  4 mg Buccal Q1H PRN Oletha Blend B, MD        NORepinephrine 8 mg in dextrose 5 % 250 mL (32 mcg/mL) infusion PMB  0-10 mcg/min Intravenous Continuous Lala Lund, ACNP   Paused at 02/29/24 1700    ondansetron (ZOFRAN) injection 4 mg  4 mg Intravenous Q6H PRN Oletha Blend B, MD        oxyCODONE (ROXICODONE) immediate release tablet 5 mg  5 mg Oral Q4H PRN Clide Cliff, NP        Or    oxyCODONE (ROXICODONE) immediate release tablet 10 mg  10 mg Oral Q4H PRN Clide Cliff, NP        pantoprazole (Protonix) injection 40 mg  40 mg Intravenous Daily Lala Lund, ACNP   40 mg at 03/01/24 1015    piperacillin-tazobactam (ZOSYN) 3.375 g in sodium chloride 0.9 % (NS) 100 mL IVPB-MBP  3.375 g Intravenous Q12H Lala Lund, ACNP   Stopped at 03/01/24 0452    propofol (DIPRIVAN) infusion 10 mg/mL  0-50 mcg/kg/min Intravenous Continuous Lala Lund, ACNP   Paused at 02/29/24 8416    [Provider Hold] umeclidinium (INCRUSE ELLIPTA) 62.5 mcg/actuation inhaler 1 puff  1 puff Inhalation Daily (RT) Oletha Blend B, MD   1 puff at 02/28/24 1236     Facility-Administered Medications Ordered in Other Encounters   Medication Dose Route Frequency Provider Last Rate Last Admin    EPINEPHrine 100 mcg/10 mL (10 mcg/mL) injection   Intravenous PRN (once a day) Ichite, Precious C, MD   10 mcg at 02/28/24  1511    Propofol (DIPRIVAN) injection   Intravenous PRN (once a day) Ichite, Precious C, MD   60 mg at 02/28/24 1507    succinylcholine (ANECTINE) injection   Intravenous PRN (once a day) Ichite, Precious C, MD   60 mg at 02/28/24 1507       Allergies:  Allergies   Allergen Reactions    Lisinopril Anaphylaxis and Swelling    Losartan Dizziness    Hctz [Hydrochlorothiazide]      SIADH       Social History:  Social History     Tobacco Use    Smoking status: Every Day     Current packs/day: 1.00     Average packs/day: 1 pack/day for 65.9 years (65.9 ttl pk-yrs)     Types: Cigarettes     Start date: 04/09/1958    Smokeless tobacco: Never    Tobacco comments:     15cpd   Vaping Use    Vaping status: Never Used   Substance Use Topics    Alcohol use: No     Alcohol/week: 0.0 standard drinks of alcohol    Drug use: No       Family History:  Family History   Problem Relation Age of Onset    Breast cancer Daughter 58    Diabetes Mother     Glaucoma Father     Colon cancer Neg Hx     Ovarian cancer Neg Hx     Endometrial cancer Neg Hx     Anesthesia problems Neg Hx     Bleeding Disorder Neg Hx        Review of Systems:  10 systems were reviewed and are negative except as noted specifically in the HPI.    Objective:     Intake/Output last 3 shifts:  I/O last 3 completed shifts:  In: 2785.6 [I.V.:1801.8; IV Piggyback:983.8]  Out: 1980 [Urine:780; Emesis/NG output:25; Blood:200]  Vital signs in last 24 hours:  BP 106/72  - Pulse 132  - Temp 36.7 ??C (98.1 ??F)  - Resp 12  - Ht 154.9 cm (5' 0.98)  - Wt 65.1 kg (143 lb 8.3 oz)  - SpO2 100%  - BMI 27.13 kg/m??     Physical Exam:  General:  Critically ill appearing  HEENT: ETT in place, NGT in place  Neck  Trachea midline, symmetrical  Lungs:   Ventilated on PSV  Cardiac: Tachycardic, in afib  Abdomen: Distended, abthera in place,  GU:   Ileal conduit, stoma is pink, no longer prolapsed, foley in place, straw colored urine in ostomy bag  Extremities: Warm and well perfused  Neuro:             intubated, sedated      Most Recent Labs:  Recent Labs     Units 02/29/24  0435 02/29/24  1609 03/01/24  0335   WBC 10*9/L 13.1* 11.7* 10.9   RBC 10*12/L 4.18 4.15 3.86*   HGB g/dL 29.5 62.1 30.8*   HCT % 36.7 36.5 33.5*   MCV fL 87.9 87.9 86.8   MCH pg 27.7 27.6 28.6   MCHC g/dL 65.7* 84.6* 96.2   RDW % 18.2* 18.0* 17.9*   PLT 10*9/L 237 203 189   MPV fL 8.5 8.5 8.4     Recent Labs     Units 02/29/24  0435 02/29/24  0841 02/29/24  1117 02/29/24  1609 03/01/24  0335 03/01/24  0859   NA mmol/L 142 142   < >  140 141 139   K mmol/L 4.9* 5.0*   < > 5.1* 4.9* 4.4   CL mmol/L 105 106  --  105 105  --    CO2 mmol/L 19.0* 18.0*  --  21.0 23.0  --    BUN mg/dL 63* 61*  --  66* 72*  --    CREATININE mg/dL 1.61* 0.96*  -- 0.45* 4.09*  --    GLU mg/dL 811 914*  --  782* 956*  --    MG mg/dL 2.7*  --   --  2.9* 2.9*  --     < > = values in this interval not displayed.     Recent Labs     Units 02/26/24  1914 03/01/24  0335   ALT U/L 20 12   AST U/L 31 12   ALKPHOS U/L 101  --    ALBUMIN g/dL 4.5  --    PROT g/dL 9.2*  --    BILITOT mg/dL 0.5  --      No results for input(s): INR, APTT in the last 168 hours.    Microbiology Data:  No results found for: LABBLOO  Urine Culture, Comprehensive   Date Value Ref Range Status   02/26/2024 (A)  Final    Mixed Gram Positive/Gram Negative Organisms Isolated   05/10/2022 >100,000 CFU/mL Escherichia coli (A)  Final   05/10/2022 50,000 to 100,000 CFU/mL Pseudomonas aeruginosa (A)  Final     Comment:     Susceptibility Testing By Consultation Only   05/10/2022 50,000 to 100,000 CFU/mL Mixed Urogenital Flora (A)  Final   06/22/2020 (A)  Final    Mixed Gram Positive/Gram Negative Organisms Isolated   02/16/2020 (A)  Final    Mixed Gram Positive/Gram Negative Organisms Isolated   11/26/2016 >100,000 CFU/mL Klebsiella pneumoniae (A)  Final   11/26/2016 10,000 to 50,000 CFU/mL Pseudomonas aeruginosa (A)  Final     Comment:     Susceptibility Testing By Consultation Only   08/02/2013 (A)  Final    30,000 CFU/ML MIXED UROGENITAL FLORA  Reference Range: (Lower detectable limit 1000 cfu/ml)  (Lower detectable limit for Acute dysuria, Suprapubic tap,  and Post-prostatic massage specimen types = 100 cfu/ml)   08/02/2013   Final    >100,000 CFU/mL  Testing results predict SUSCEPTIBILITY for the oral agents  cefdinir, cefuroxime, and cephalexin when used for therapy of  uncomplicated UTIs due to this organism.   07/07/2013   Final    MIXED GRAM POSITIVE/NEGATIVE ORGANISMS ISOLATED  Reference Range: (Lower detectable limit 1000 cfu/ml)  (Lower detectable limit for Acute dysuria, Suprapubic tap,  and Post-prostatic massage specimen types = 100 cfu/ml)   09/17/2011 FINAL  MIXED UROGENITAL FLORA  Final Comment:     Reference Range: (Lower detectable limit 1000 cfu/ml)  (Lower detectable limit for Acute dysuria, Suprapubic tap,  and Post-prostatic massage specimen types = 100 cfu/ml)   03/23/2011 FINAL  MIXED UROGENITAL FLORA  Final     Comment:     Reference Range: (Lower detectable limit 1000 cfu/ml)  (Lower detectable limit for Acute dysuria, Suprapubic tap,  and Post-prostatic massage specimen types = 100 cfu/ml)   03/18/2011 FINAL  NO GROWTH  Final     Comment:     Reference Range: (Lower detectable limit 1000 cfu/ml)  (Lower detectable limit for Acute dysuria, Suprapubic tap,  and Post-prostatic massage specimen types = 100 cfu/ml)     No results found for: ANACX  Most recent Urinalysis:  Recent  Labs     Units 02/26/24  2313   LEUKOCYTESUR  Moderate*   NITRITE  Negative   RBCUA /HPF 42*   WBCUA /HPF 71*   SQUEPIU /HPF 3   BACTERIA /HPF Moderate*      Urinalysis History:  Leukocyte Esterase, UA   Date Value Ref Range Status   02/26/2024 Moderate (A) Negative Final   05/10/2022 Large (A) Negative Final   06/22/2020 Moderate (A) Negative Final   02/16/2020 Moderate (A) Negative Final   11/26/2016 Moderate (A) Negative Final   08/02/2013 +1 NEGATIVE Final   07/07/2013 +2 NEGATIVE Final   09/17/2011 +2 NEGATIVE Final   03/18/2011 NEGATIVE NEGATIVE Final     Nitrite, UA   Date Value Ref Range Status   02/26/2024 Negative Negative Final   05/10/2022 Positive (A) Negative Final   06/22/2020 Negative Negative Final   02/16/2020 Positive (A) Negative Final   11/26/2016 Negative Negative Final   08/02/2013 NEGATIVE NEGATIVE Final   07/07/2013 NEGATIVE NEGATIVE Final   09/17/2011 NEGATIVE NEGATIVE Final   03/18/2011 NEGATIVE NEGATIVE Final     RBC, UA   Date Value Ref Range Status   02/26/2024 42 (H) <=4 /HPF Final   05/10/2022 4 <=4 /HPF Final   06/22/2020 2 <=4 /HPF Final   02/16/2020 3 <4 /HPF Final   11/26/2016 21 (H) <4 /HPF Final     WBC, UA   Date Value Ref Range Status   02/26/2024 71 (H) 0 - 5 /HPF Final 05/10/2022 6 (H) 0 - 5 /HPF Final   06/22/2020 25 (H) 0 - 5 /HPF Final   02/16/2020 10 (H) 0 - 5 /HPF Final   11/26/2016 12 (H) 0 - 5 /HPF Final     Squam Epithel, UA   Date Value Ref Range Status   02/26/2024 3 0 - 5 /HPF Final   05/10/2022 1 0 - 5 /HPF Final   06/22/2020 <1 0 - 5 /HPF Final   02/16/2020 2 0 - 5 /HPF Final   11/26/2016 <1 0 - 5 /HPF Final   09/17/2011 3 /HPF Final   03/18/2011 <1 /HPF Final     Bacteria, UA   Date Value Ref Range Status   02/26/2024 Moderate (A) None Seen /HPF Final   05/10/2022 Few (A) None Seen /HPF Final   06/22/2020 Many (A) None Seen /HPF Final   02/16/2020 Few (A) None Seen /HPF Final   11/26/2016 Many (A) None Seen /HPF Final   08/02/2013 FEW  Final   07/07/2013 MANY  Final   03/18/2011 RARE  Final        Imaging:  XR Chest Portable  Result Date: 03/01/2024  EXAM: XR CHEST PORTABLE ACCESSION: [id number] UN REPORT DATE: 03/01/2024 6:12 AM     CLINICAL INDICATION: VENTILATOR/RESPIRATOR DEPENDENCE STATUS      TECHNIQUE: Single View AP Chest Radiograph.     COMPARISON: Chest 02/29/2024     FINDINGS:     Unchanged support devices.     HEART &  MEDIASTINUM: Similar borderline prominent cardiac silhouette. As better demonstrated on CT imaging, large amount of abdominal contents have herniated into the caudal aspect of the thorax.     PULMONARY/PLEURAL SPACE:  Low lung volumes with associated bibasilar atelectasis. Mass effect associated with large hernia as well as small pleural effusions also contributes to atelectasis within the lung bases. Furthermore, some element of consolidation (pneumonia, aspiration, etc.) in the lung bases is possible.  Minimal change in portable imaging of the chest relative to recent prior imaging. 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     TECHNIQUE: Portable chest radiograph         FINDINGS:     HARDWARE: - The tip of the right IJ approach central venous catheter terminates over the right atrium - Nasogastric tube traverses the anticipated proximal esophageal course and likely terminates within the intrathoracic aspect of the stomach - Endotracheal tube terminates approximately 4 cm cephalad to the carina     HEART &  MEDIASTINUM: Similar borderline prominent cardiac silhouette. As better demonstrated on CT imaging, large amount of abdominal contents have herniated into the caudal aspect of the thorax.     PULMONARY/PLEURAL SPACE:  Low lung volumes with associated bibasilar atelectasis. Mass effect associated with large hernia as well as small pleural effusions also contributes to atelectasis within the lung bases. Furthermore, some element of consolidation (pneumonia, aspiration, etc.) in the lung bases is possible.                 Minimal change in portable imaging of the chest relative to recent prior imaging. See comments above.                         XR Chest Portable  Result Date: 02/29/2024  EXAM: XR CHEST PORTABLE ACCESSION: [id number] UN REPORT DATE: 02/29/2024 6:22 AM     CLINICAL INDICATION: VENTILATOR/RESPIRATOR DEPENDENCE STATUS      TECHNIQUE: Single View AP Chest Radiograph.     COMPARISON: Chest 02/28/2024 at 5:30 p.m.     FINDINGS:     HARDWARE: - The tip of the right IJ approach central venous catheter terminates over the right atrium - Nasogastric tube traverses the anticipated proximal esophageal course and likely terminates within the stomach - Endotracheal tube terminates approximately 3 cm cephalad to the carina     HEART &  MEDIASTINUM: Similar borderline prominent cardiac silhouette. As better demonstrated on CT imaging, large amount of abdominal contents have herniated into the caudal aspect of the thorax.     PULMONARY/PLEURAL SPACE:  Low lung volumes with associated bibasilar atelectasis. To mass effect associated with large hernia as well as small pleural effusions also contributes to atelectasis within the lung bases. Furthermore, some element of consolidation (pneumonia, aspiration, etc.) in the lung bases is quite likely.                 Consider withdrawal of the right IJ central venous catheter 3 cm for more typical positioning near the superior cavoatrial junction.    XR Chest Portable  Result Date: 02/28/2024  EXAM: XR CHEST PORTABLE DATE: 02/28/2024 5:54 PM ACCESSION: [id number] UN DICTATED: 02/28/2024 5:59 PM INTERPRETATION LOCATION: MAIN CAMPUS     CLINICAL INDICATION: 83 years old Female with  LINE CHECK (CATHETER VASCULAR FIT)      COMPARISON: Comparison is made to multiple prior chest radiographs, the most recent from 0439 hours.     TECHNIQUE: Portable chest radiograph         FINDINGS:     HARDWARE: - The tip of the right IJ approach central venous catheter terminates over the right atrium - Nasogastric tube traverses the anticipated proximal esophageal course and likely terminates within the intrathoracic aspect of the stomach - Endotracheal tube terminates approximately 3 cm cephalad to the carina     HEART &  MEDIASTINUM: Similar borderline prominent cardiac silhouette. As better demonstrated on CT imaging, large amount of abdominal contents have herniated into the caudal aspect of the thorax.     PULMONARY/PLEURAL SPACE:  Low lung volumes with associated bibasilar atelectasis. To mass effect associated with large hernia as well as small pleural effusions also contributes to atelectasis within the lung bases. Furthermore, some element of consolidation (pneumonia, aspiration, etc.) in the lung bases is quite likely.                 Consider withdrawal of the right IJ central venous catheter 3 cm for more typical positioning near the superior cavoatrial junction.                     XR Chest Portable  Result Date: 02/28/2024  EXAM: XR CHEST PORTABLE ACCESSION: [id number] UN REPORT DATE: 02/28/2024 2:22 PM     CLINICAL INDICATION: shortness of breath, tachycardia ; OTHER      TECHNIQUE: Single View AP Chest Radiograph.     COMPARISON: CT abdomen 02/26/2024 and chest radiograph 02/26/2024     FINDINGS:     New NG/OG tube within large hiatal hernia.     Bilateral lower lung airspace consolidation. No pleural effusion or pneumothorax.     Cardiac silhouette is normal in size. Thoracic aorta with calcifications.             *  New NG/OG tube and large hiatal hernia. *  Bibasilar atelectasis/airspace disease.

## 2024-03-01 NOTE — Unmapped (Signed)
 [facility]U PROGRESS NOTE     Date of Service: 03/01/2024    Hospital Day: LOS: 3 days        Surgery Date: TBD   Surgical Attending: [name], MD    Critical Care Attending: Robina Ade, MD    Interval History:   Abdomen remains open, plan for RTOR today for hopeful closure and urology intervention.    Remains HDS off pressors, hgb stable 11.0, out from [facility]. Remains in Afib w/ HR in 120's while on Amio gtt.     Decreased UOP overnight, bolused a total of 1.5L crystalloid. Additional LR 500cc bolus given this AM @0800 , Plasma-lyte 500cc x2 for decreased UOP.     AM SBT passed, will assess for extubation post-op. Rested on rate today during rounds for comfort.     History of Present Illness:   Tammy Braun is a 83 y.o. female with PMH bladder cancer with cystectomy and ileal conduit, COPD, recent dx of afib, moderate-severe TR, CAD (high calcification score), T2DM presented to [facility] with projectile vomiting and abdominal pain, found to have SBO with transition point at level of ileostomy on CT A/P. On initial presentation, labs remarkable for leukocytosis thought to be iso hemoconcentration, AKI on CKD, mildly elevated venous lactate improved with resuscitation. Admitted to [facility] for worsening hypoxia on HFNC requiring intubation in setting of hiatal hernia. OR on 02/28/24 for ex-lap, reduciton of parastomal hernia, small bowel resection with primary anastomosis, lysis of adhesions, abdomen left open with ABThera in place.     Hospital Course:  - 02/26/24: Admitted to [facility], floor status, consult to urology for possible ileal conduit revision.   - 02/27/24: Patient level of care escalated to step-down iso increased oxygen needs while in ED.  - 02/28/24: Upgraded to ICU. Intubated at bed-side. OR for ex-lap, reduction of parastomal hernia, small bowel resection with primary anastomosis, lysis of adhesions, abdomen left open with ABThera in place.        ASSESSMENT & PLAN:     Neurologic:  - Pain: Sch IV Tylenol, IV Robaxin, HM IV PRN 0.5-1mg   - Fent gtt  - Sedation: Propofol gtt - currently paused    Cardiovascular:  #HTN, HLD:   - Chronic. Hemodynamically tenuous requiring low dose vasopressor at this time.   - MAP goal > 65  - Holding home ASA and Lipitor    #Afib/flutter w RVR:   Prior hx of paroxysmal afib, flares iso acute illnesses or hospitalization per dauther. First report of A-fib in chart from Dec 2024 admission. Will tx underlying stressor of SBO w OR this evening. CHADSvasc = 6, not anticoagulated at baseline per chart review.   - IV amio (bolus + gtt) if HR > 150 after bolus  - Holding home diltiazem, question efficacy given likely poor absorption  - Echo 3/1: Pending  - Pro-BNP: Pending      Respiratory:  #Acute on chronic hypoxemia  #Hx of COPD:   - 2/28: Intubated  - CXR 02/26/27: large R complex hernia w/ compressive atelectasis of R lung, no pleural effusion of ptx  - Repeat CXR post intubation: good position ~4cm  - Continue pulmonary toilet  - Home incruse ellipta   - PRN albuterol   - Daily SBT  - Daily ABG    FEN/GI:  F: Medlocked, got 1L bolus overnight, 1L  E: Replete electrolytes PRN  N: NPO   - NGT to LIWS  - GI prophylaxis: zofran prn for nausea; IV protnix   -  Bowel regimen: held iso bowel obstruction     #S/p ex-lap, reduction of parastomal hernia, small bowel resection with primary anastomosis, lysis of adhesions, abdomen left open with ABThera in place 2/28      #Large hiatal hernia, chronic  - Non-operative, chronic    Renal/Genitourinary:  #AKI on CKD:   - Cr 2.4 < 2.2 (Baseline 1.4)  - FeNa 2/27 1.9% (Indeterminate)    #Hydronephrosis:  - Caution with perioperative fluid resuscitation per Cards   - Consideration for c/s to nephrology iso hyperK, CKD   - If persistent AKI after closure consider renal ultrasound to assess hydro/obstruction    #H/o bladder cancer s/p radical cystectomy with ileal conduit (2012):  #Parastomal hernia:  - SRU consulted, following   - Strict I&Os  - Foley in conduit, low UOP  - Tentative plan for OR tomorrow for urostomy revision/abdominal closure    Endocrine:   - Glycemic Control: SSI    Hematologic:   - Monitor CBC daily, transfuse products as indicated    Immunologic/Infectious Disease:  - Afebrile, leukocytosis 13.1 < 5.8 < 14.1, continue to monitor  - Cultures:   - Ucx (2/26): Mixed GP/GN organisms    - RPP (2/27): negative   - Abx:    - Zosyn x4d (3/1 - 3/5)    Musculoskeletal:   NAI    Daily Care Checklist:   - Stress Ulcer Prevention: Yes: Mechanically ventilated  - DVT Prophylaxis: Chemical: SQ Heparin  - Daily Awakening: Yes  - Spontaneous Breathing Trial:  post-op  - Indication for Central/PICC Line: Yes  Infusions requiring central access, Hemodynamic monitoring, and Inadequate peripheral access  - Indication for Urinary Catheter: Yes  Strict intake and output, Agressive diuresis/hydration, and Urinary retention/obstruction  - Diagnostic images/reports of past 24hrs reviewed: Yes    Disposition:   - Continue ICU care  - PT/OT consulted:  will consult post OR    Tammy Braun is critically ill due to acute kidney injury, altered mental status, anemia - acute blood loss, and atrial fibrillation, SBO, open abdomen, postoperative mechanical ventilation. This critical care time includes examining the patient, evaluating the hemodynamic, laboratory, and radiographic data, independently developing a comprehensive management plan, and serially assessing the patient's response to these critical care interventions. This critical care time excludes procedures.    Critical care time: 90 minutes     Ernst Bowler D'Errico, PA   Surgical Intensive Care Unit  [place] [facility]       SUBJECTIVE:      Intubated/sedated     OBJECTIVE:     Physical Exam:  Constitutional: Lying supine, NAD  Neurologic: Sedated, +cough/gag/corneal reflexe   Respiratory: Mechanically ventilated via ETT, coarse lung sounds throughout   Cardiovascular: Tachycardic, irregular rhythm, mild edema to LE   Gastrointestinal: Mildly distended abdomen, ABThera in place draining SS fluid. Urostomy with Foley in stoma draining clear yellow urine.   Musculoskeletal: Mild BLE edema.  Skin: Warm and dry     Temp:  [36.5 ??C (97.7 ??F)-38.4 ??C (101.2 ??F)] 36.5 ??C (97.7 ??F)  Heart Rate:  [124-147] 124  SpO2 Pulse:  [88-140] 125  Resp:  [12-30] 16  BP: (88-125)/(61-95) 114/67  MAP (mmHg):  [74-98] 82  A BP-1: (94-112)/(55-102) 112/102  MAP:  [56 mmHg-105 mmHg] 56 mmHg  FiO2 (%):  [40 %-50 %] 40 %  SpO2:  [99 %-100 %] 100 %       Recent Laboratory Results:  Recent Labs  Units 02/29/24  1117   PHART  7.28*   PCO2ART mm Hg 39.4   PO2ART mm Hg 140.0*   HCO3ART mmol/L 18*   BEART  -7.9*   O2SATART % 98.9     Recent Labs     Units 02/29/24  0841 02/29/24  1117 02/29/24  1609 03/01/24  0335   NA mmol/L 142 140 140 141   K mmol/L 5.0* 4.8* 5.1* 4.9*   CL mmol/L 106  --  105 105   CO2 mmol/L 18.0*  --  21.0 23.0   BUN mg/dL 61*  --  66* 72*   CREATININE mg/dL 1.61*  --  0.96* 0.45*   GLU mg/dL 409*  --  811* 914*     Lab Results   Component Value Date    BILITOT 0.5 02/26/2024    BILITOT 0.3 01/06/2024    ALT 12 03/01/2024    ALT 20 02/26/2024    AST 12 03/01/2024    AST 31 02/26/2024    GGT 16 09/17/2011    GGT 354 (H) 05/03/2011    ALKPHOS 101 02/26/2024    ALKPHOS 90 01/06/2024    PROT 9.2 (H) 02/26/2024    PROT 7.5 01/06/2024    ALBUMIN 4.5 02/26/2024    ALBUMIN 3.2 (L) 01/06/2024     Recent Labs     Units 02/29/24  1203 02/29/24  1827 02/29/24  2214 03/01/24  0207   POCGLU mg/dL 782 956 213 086     Recent Labs     Units 02/29/24  0435 02/29/24  1609 03/01/24  0335   WBC 10*9/L 13.1* 11.7* 10.9   RBC 10*12/L 4.18 4.15 3.86*   HGB g/dL 57.8 46.9 62.9*   HCT % 36.7 36.5 33.5*   MCV fL 87.9 87.9 86.8   MCH pg 27.7 27.6 28.6   MCHC g/dL 52.8* 41.3* 24.4   RDW % 18.2* 18.0* 17.9*   PLT 10*9/L 237 203 189   MPV fL 8.5 8.5 8.4     No results for input(s): INR, APTT in the last 24 hours.    Invalid input(s): PTPATIENT   Lines & Tubes:   Patient Lines/Drains/Airways Status       Active Peripheral & Central Intravenous Access       Name Placement date Placement time Site Days    Peripheral IV 02/26/24 Anterior;Right;Upper Arm 02/26/24  1916  Arm  3    CVC Triple Lumen 02/28/24 Non-tunneled Right Internal jugular 02/28/24  1641  Internal jugular  1                     Patient Lines/Drains/Airways Status       Active Wounds       Name Placement date Placement time Site Days    Negative Pressure Wound Therapy Abdomen Mid 02/28/24  2115  Abdomen  1    Surgical Site 04/08/18 Shoulder Left 04/08/18  1338  -- 2153    Surgical Site 06/05/22 Eye Left 06/05/22  0928  -- 634    Surgical Site 06/19/22 Eye Right 06/19/22  1024  -- 620    Surgical Site 02/28/24 Abdomen 02/28/24  2055  -- 1    Wound 06/24/20 Other (comment) Buttocks inner right buttock 06/24/20  1010  Buttocks  1345                     Respiratory/ventilator settings for last 24 hours:   Vent Mode: PSV-CPAP  FiO2 (%):  40 %  PEEP: 8 cm H20  PR SUP: 5 cm H20    Intake/Output last 3 shifts:  I/O last 3 completed shifts:  In: 4676.1 [I.V.:2443.7; IV Piggyback:2232.4]  Out: 1575 [Urine:675; Blood:200]    Daily/Recent Weight:  65.1 kg (143 lb 8.3 oz)    BMI:  Body mass index is 27.13 kg/m??.                                  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    Chronic kidney disease     Depression, psychotic (CMS-HCC)     Diabetes mellitus (CMS-HCC)     in past    Emphysema of lung (CMS-HCC)     Financial difficulties     Frail elderly     Hearing impairment     Hernia     History of transfusion     Hyperlipidemia     Hypertension     Impaired mobility     Osteoporosis     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/08/2015    Visual impairment      Past Surgical History:   Procedure Laterality Date    ABDOMINAL SURGERY      BLADDER SURGERY      BREAST CYST EXCISION      CHEMOTHERAPY  2012    bladder    GALLBLADDER SURGERY      stone removal    ILEOSTOMY  2012    PR COLONOSCOPY FLX DX W/COLLJ SPEC WHEN PFRMD N/A 02/09/2015    Procedure: COLONOSCOPY, FLEXIBLE, PROXIMAL TO SPLENIC FLEXURE; DIAGNOSTIC, W/WO COLLECTION SPECIMEN BY BRUSH OR WASH;  Surgeon: Dewaine Conger, MD;  Location: [facility] [place];  Service: Gastroenterology    PR RECONSTR TOTAL SHOULDER IMPLANT Left 04/08/2018    Procedure: ARTHROPLASTY, GLENOHUMERAL JOINT; TOTAL SHOULDER(GLENOID & PROXIMAL HUMERAL REPLACEMENT(EG, TOTAL SHOULDER);  Surgeon: Tomasa Rand, MD;  Location: [facility] OR [facility];  Service: Ortho Sports Medicine    PR SIGMOIDOSCOPY,BIOPSY N/A 03/11/2015    Procedure: SIGMOIDOSCOPY, FLEXIBLE; WITH BIOPSY, SINGLE OR MULTIPLE;  Surgeon: Wilburt Finlay, MD;  [facility]: [facility];  Service: Gastroenterology    PR XCAPSL CTRC RMVL INSJ IO LENS PROSTH W/O ECP Left 06/05/2022    Procedure: EXTRACAPSULAR CATARACT REMOVAL W/INSERTION OF INTRAOCULAR LENS PROSTHESIS, MANUAL OR MECHANICAL TECHNIQUE WITHOUT ENDOSCOPIC CYCLOPHOTOCOAGULATION;  Surgeon: Garner Gavel, MD;  Location: [facility] OR [facility];  Service: Ophthalmology    PR XCAPSL CTRC RMVL INSJ IO LENS PROSTH W/O ECP Right 06/19/2022    Procedure: EXTRACAPSULAR CATARACT REMOVAL W/INSERTION OF INTRAOCULAR LENS PROSTHESIS, MANUAL OR MECHANICAL TECHNIQUE WITHOUT ENDOSCOPIC CYCLOPHOTOCOAGULATION;  Surgeon: Garner Gavel, MD;  Location: [facility] OR [facility];  Service: Ophthalmology     Scheduled Medications:   [Provider Hold] aspirin  81 mg Oral Daily    [Provider Hold] atorvastatin  40 mg Oral Daily    [Provider Hold] dilTIAZem  120 mg Oral Daily    flu vacc [id number](105yr up)-PF  0.5 mL Intramuscular During hospitalization    [Provider Hold] fluPHENAZine decanoate  50 mg Intramuscular Q30 Days    [Provider Hold] furosemide  20 mg Oral Daily [  Provider Hold] gabapentin  100 mg Oral TID    heparin (porcine) for subcutaneous use  5,000 Units Subcutaneous Q8H SCH    insulin lispro  0-20 Units Subcutaneous Q4H SCH    pantoprazole (Protonix) intravenous solution  40 mg Intravenous Daily    piperacillin-tazobactam  3.375 g Intravenous Q12H    umeclidinium  1 puff Inhalation Daily (RT)     Continuous Infusions:   amiodarone 0.5 mg/min (03/01/24 0546)    fentaNYL citrate (PF) 50 mcg/mL infusion 25 mcg/hr (03/01/24 0537)    NORepinephrine bitartrate-NS Stopped (02/29/24 1700)    propofol 10 mg/mL infusion Stopped (02/29/24 0718)     PRN Medications:  albuterol, [Provider Hold] cyclobenzaprine, dextrose in water, fentaNYL (PF) **OR** fentaNYL (PF), glucagon, HYDROmorphone **OR** HYDROmorphone, HYDROmorphone, nicotine polacrilex **OR** nicotine polacrilex, ondansetron, oxyCODONE **OR** oxyCODONE      ATTENDING ATTESTATION:    I performed a history and physical examination of the patient and discussed the patient's management with the advanced practice provider. I reviewed the advanced practice provider note and agree with the documented findings and plan of care.    Return to the OR today for intra-abdominal assessment.  Continue to assess intravascular volume status.  Wean ventilator as tolerated    This patient was critically ill during my evaluation due to Acute Blood Loss Anemia, Acute Kidney Injury, Acute Pain, Open Abdomen, and acute respiratory failure secondary to open abdomen .    My interventions included Management of Mechanical ventilation and sedation, Adjustment of pain medication, Repleation of electrolytes, Extensive Wound Care, and Frequent Neuro status checks,  Monitoring of blood pressure, heart rate and volume status, Review of all films, cultures and labs, Discussion with primary team, consults, and family regarding the patient???s status and prognosis.  My total critical care time, excluding procedures, was 95 minutes.    ________________________________

## 2024-03-01 NOTE — Unmapped (Signed)
 Preoperative Diagnosis: SBO small bowel obstruction    Postoperative Diagnosis:  Same    Procedure(s):  Midline - REOPENING OF RECENT LAPAROTOMY      Performing Service: Trauma  Surgeons and Role:     * [name]., MD - Primary     * Gillian Scarce Trudi Ida, MD - Resident - Assisting     * Awe, Ralph Leyden, MD - Resident - Assisting     * Babs Bertin, Nita Sickle, MD - Resident - Assisting    Assistant(s):  None     Anesthesia: General    Fluids:  See anesthesia record    Estimated blood loss:  2 mL    Complications: None    Specimens: None collected    Drains:    Foley in stoma - 5cc in balloon        Indications: 83 y.o. female with SBO d/t parastomal hernia with prior ileal conduit     OperativeFindings:    Viable stoma without leak  Catheter placed in stoma, draining well, 5cc in balloon    Description:      Urology entered the case following general surgery Abthera removal and bowel inspection, please see their note for additional detail. We then inspected the ileal conduit which was well appearing, stoma was pink/patent. A catheter was placed into the stoma and leak test performed, which was negative. The catheter was left in the stoma with balloon inflated (5cc) below the level of the fascia. The balloon was palpated within the conduit and noted that no pressure was being applied from the balloon to the conduit wall.    We then turned care of the patient back to general surgery, please see their note      Post Op Plan:    - Continue foley per ostomy    Troy Sine, MD, MD  Date: 03/01/2024  Time: 8:22 PM

## 2024-03-01 NOTE — Unmapped (Signed)
 Pt switched back to [facility] d/t occasional apnea ventilation and per team. No plans to extubate. Pt scheduled for OR. RN at bedside and aware.

## 2024-03-01 NOTE — Unmapped (Signed)
 Pt has ETT Size 7.0 @ 22 gums    Pt switched from PSV to [facility] today.     Current settings are: PRVC 300/15/+8/40%    PT has clear/diminished BBS with amount of small amt of thick white inline secretions.      Tx's given as scheduled. Oral care completed. ETT retaped/repositioned, airway remains patent, secure, and no breakdown noted at this time. Emergency equipment at the bedside. No plans to extubate today.

## 2024-03-01 NOTE — Unmapped (Signed)
 Problem: Adult Inpatient Plan of Care  Goal: Plan of Care Review  Outcome: Ongoing - Unchanged  Goal: Patient-Specific Goal (Individualized)  Outcome: Ongoing - Unchanged  Goal: Absence of Hospital-Acquired Illness or Injury  Outcome: Ongoing - Unchanged  Intervention: Identify and Manage Fall Risk  Recent Flowsheet Documentation  Taken 02/29/2024 2000 by Governor Rooks, RN  Safety Interventions:   environmental modification   fall reduction program maintained  Intervention: Prevent Skin Injury  Recent Flowsheet Documentation  Taken 03/01/2024 0000 by Governor Rooks, RN  Positioning for Skin: Right  Taken 02/29/2024 2200 by Governor Rooks, RN  Positioning for Skin: Left  Taken 02/29/2024 2000 by Governor Rooks, RN  Positioning for Skin: Right  Intervention: Prevent and Manage VTE (Venous Thromboembolism) Risk  Recent Flowsheet Documentation  Taken 03/01/2024 0000 by Governor Rooks, RN  Anti-Embolism Device Type: SCD, Knee  Anti-Embolism Device Status: (Heparin SQ) On  Anti-Embolism Device Location: BLE  Taken 02/29/2024 2200 by Governor Rooks, RN  Anti-Embolism Device Type: SCD, Knee  Anti-Embolism Device Status: (Heparin SQ) On  Anti-Embolism Device Location: BLE  Taken 02/29/2024 2000 by Governor Rooks, RN  Anti-Embolism Device Type: SCD, Knee  Anti-Embolism Device Status: (Heparin SQ) On  Anti-Embolism Device Location: BLE  Goal: Optimal Comfort and Wellbeing  Outcome: Ongoing - Unchanged  Goal: Readiness for Transition of Care  Outcome: Ongoing - Unchanged  Goal: Rounds/Family Conference  Outcome: Ongoing - Unchanged     Problem: Infection  Goal: Absence of Infection Signs and Symptoms  Outcome: Ongoing - Unchanged     Problem: Fall Injury Risk  Goal: Absence of Fall and Fall-Related Injury  Outcome: Ongoing - Unchanged  Intervention: Promote Injury-Free Environment  Recent Flowsheet Documentation  Taken 02/29/2024 2000 by Governor Rooks, RN  Safety Interventions:   environmental modification fall reduction program maintained     Problem: Wound  Goal: Optimal Coping  Outcome: Ongoing - Unchanged  Goal: Optimal Functional Ability  Outcome: Ongoing - Unchanged  Intervention: Optimize Functional Ability  Recent Flowsheet Documentation  Taken 02/29/2024 2000 by Governor Rooks, RN  Activity Management: bedrest  Goal: Absence of Infection Signs and Symptoms  Outcome: Ongoing - Unchanged  Goal: Improved Oral Intake  Outcome: Ongoing - Unchanged  Goal: Optimal Pain Control and Function  Outcome: Ongoing - Unchanged  Goal: Skin Health and Integrity  Outcome: Ongoing - Unchanged  Intervention: Optimize Skin Protection  Recent Flowsheet Documentation  Taken 02/29/2024 2000 by Governor Rooks, RN  Activity Management: bedrest  Head of Bed (HOB) Positioning: HOB at 30-45 degrees  Goal: Optimal Wound Healing  Outcome: Ongoing - Unchanged     Problem: Skin Injury Risk Increased  Goal: Skin Health and Integrity  Outcome: Ongoing - Unchanged  Intervention: Optimize Skin Protection  Recent Flowsheet Documentation  Taken 02/29/2024 2000 by Governor Rooks, RN  Activity Management: bedrest  Head of Bed (HOB) Positioning: HOB at 30-45 degrees     Problem: Self-Care Deficit  Goal: Improved Ability to Complete Activities of Daily Living  Outcome: Ongoing - Unchanged     Problem: Non-Violent Restraints  Goal: Patient will remain free of restraint events  Outcome: Resolved  Goal: Patient will remain free of physical injury  Outcome: Resolved

## 2024-03-02 LAB — BLOOD GAS CRITICAL CARE PANEL, ARTERIAL
BASE EXCESS ARTERIAL: -2.7 — ABNORMAL LOW (ref -2.0–2.0)
BASE EXCESS ARTERIAL: -5.5 — ABNORMAL LOW (ref -2.0–2.0)
CALCIUM IONIZED ARTERIAL (MG/DL): 4.45 mg/dL (ref 4.40–5.40)
CALCIUM IONIZED ARTERIAL (MG/DL): 4.54 mg/dL (ref 4.40–5.40)
GLUCOSE WHOLE BLOOD: 153 mg/dL (ref 70–179)
GLUCOSE WHOLE BLOOD: 156 mg/dL (ref 70–179)
HCO3 ARTERIAL: 20 mmol/L — ABNORMAL LOW (ref 22–27)
HCO3 ARTERIAL: 24 mmol/L (ref 22–27)
HEMOGLOBIN BLOOD GAS: 11.2 g/dL — ABNORMAL LOW (ref 12.00–16.00)
HEMOGLOBIN BLOOD GAS: 11.7 g/dL — ABNORMAL LOW (ref 12.00–16.00)
LACTATE BLOOD ARTERIAL: 1.3 mmol/L — ABNORMAL HIGH (ref <1.3)
LACTATE BLOOD ARTERIAL: 1.5 mmol/L — ABNORMAL HIGH (ref <1.3)
O2 SATURATION ARTERIAL: 51.9 % — CL (ref 94.0–100.0)
O2 SATURATION ARTERIAL: 99 % (ref 94.0–100.0)
PCO2 ARTERIAL: 39.1 mmHg (ref 35.0–45.0)
PCO2 ARTERIAL: 50.6 mmHg — ABNORMAL HIGH (ref 35.0–45.0)
PH ARTERIAL: 7.29 — ABNORMAL LOW (ref 7.35–7.45)
PH ARTERIAL: 7.32 — ABNORMAL LOW (ref 7.35–7.45)
PO2 ARTERIAL: 154 mmHg — ABNORMAL HIGH (ref 80.0–110.0)
PO2 ARTERIAL: 31.1 mmHg — CL (ref 80.0–110.0)
POTASSIUM WHOLE BLOOD: 4.6 mmol/L (ref 3.4–4.6)
POTASSIUM WHOLE BLOOD: 4.6 mmol/L (ref 3.4–4.6)
SODIUM WHOLE BLOOD: 140 mmol/L (ref 135–145)
SODIUM WHOLE BLOOD: 141 mmol/L (ref 135–145)

## 2024-03-02 LAB — CBC
HEMATOCRIT: 34.3 % (ref 34.0–44.0)
HEMATOCRIT: 35.6 % (ref 34.0–44.0)
HEMOGLOBIN: 11.1 g/dL — ABNORMAL LOW (ref 11.3–14.9)
HEMOGLOBIN: 11.3 g/dL (ref 11.3–14.9)
MEAN CORPUSCULAR HEMOGLOBIN CONC: 32.5 g/dL (ref 32.0–36.0)
MEAN CORPUSCULAR HEMOGLOBIN CONC: 31.7 g/dL — ABNORMAL LOW (ref 32.0–36.0)
MEAN CORPUSCULAR HEMOGLOBIN: 28.1 pg (ref 25.9–32.4)
MEAN CORPUSCULAR HEMOGLOBIN: 27.4 pg (ref 25.9–32.4)
MEAN CORPUSCULAR VOLUME: 86.5 fL (ref 77.6–95.7)
MEAN CORPUSCULAR VOLUME: 86.4 fL (ref 77.6–95.7)
MEAN PLATELET VOLUME: 8.5 fL (ref 6.8–10.7)
MEAN PLATELET VOLUME: 9 fL (ref 6.8–10.7)
PLATELET COUNT: 208 10*9/L (ref 150–450)
PLATELET COUNT: 237 10*9/L (ref 150–450)
RED BLOOD CELL COUNT: 3.96 10*12/L (ref 3.95–5.13)
RED BLOOD CELL COUNT: 4.12 10*12/L (ref 3.95–5.13)
RED CELL DISTRIBUTION WIDTH: 18.1 % — ABNORMAL HIGH (ref 12.2–15.2)
RED CELL DISTRIBUTION WIDTH: 18 % — ABNORMAL HIGH (ref 12.2–15.2)
WBC ADJUSTED: 10.9 10*9/L (ref 3.6–11.2)
WBC ADJUSTED: 12.8 10*9/L — ABNORMAL HIGH (ref 3.6–11.2)

## 2024-03-02 LAB — BASIC METABOLIC PANEL
ANION GAP: 15 mmol/L — ABNORMAL HIGH (ref 5–14)
ANION GAP: 15 mmol/L — ABNORMAL HIGH (ref 5–14)
ANION GAP: 17 mmol/L — ABNORMAL HIGH (ref 5–14)
BLOOD UREA NITROGEN: 68 mg/dL — ABNORMAL HIGH (ref 9–23)
BLOOD UREA NITROGEN: 77 mg/dL — ABNORMAL HIGH (ref 9–23)
BLOOD UREA NITROGEN: 78 mg/dL — ABNORMAL HIGH (ref 9–23)
BUN / CREAT RATIO: 28
BUN / CREAT RATIO: 26
BUN / CREAT RATIO: 26
CALCIUM: 8.3 mg/dL — ABNORMAL LOW (ref 8.7–10.4)
CALCIUM: 8.6 mg/dL — ABNORMAL LOW (ref 8.7–10.4)
CALCIUM: 8.3 mg/dL — ABNORMAL LOW (ref 8.7–10.4)
CHLORIDE: 105 mmol/L (ref 98–107)
CHLORIDE: 105 mmol/L (ref 98–107)
CHLORIDE: 105 mmol/L (ref 98–107)
CO2: 22 mmol/L (ref 20.0–31.0)
CO2: 21 mmol/L (ref 20.0–31.0)
CO2: 21 mmol/L (ref 20.0–31.0)
CREATININE: 2.42 mg/dL — ABNORMAL HIGH (ref 0.55–1.02)
CREATININE: 2.99 mg/dL — ABNORMAL HIGH (ref 0.55–1.02)
CREATININE: 3.02 mg/dL — ABNORMAL HIGH (ref 0.55–1.02)
EGFR CKD-EPI (2021) FEMALE: 20 mL/min/{1.73_m2} — ABNORMAL LOW (ref >=60)
EGFR CKD-EPI (2021) FEMALE: 15 mL/min/{1.73_m2} — ABNORMAL LOW (ref >=60)
EGFR CKD-EPI (2021) FEMALE: 15 mL/min/{1.73_m2} — ABNORMAL LOW (ref >=60)
GLUCOSE RANDOM: 157 mg/dL (ref 70–179)
GLUCOSE RANDOM: 165 mg/dL (ref 70–179)
GLUCOSE RANDOM: 191 mg/dL — ABNORMAL HIGH (ref 70–179)
POTASSIUM: 4.7 mmol/L (ref 3.4–4.8)
POTASSIUM: 5 mmol/L — ABNORMAL HIGH (ref 3.4–4.8)
POTASSIUM: 4.9 mmol/L — ABNORMAL HIGH (ref 3.4–4.8)
SODIUM: 142 mmol/L (ref 135–145)
SODIUM: 141 mmol/L (ref 135–145)
SODIUM: 143 mmol/L (ref 135–145)

## 2024-03-02 LAB — MAGNESIUM
MAGNESIUM: 2.9 mg/dL — ABNORMAL HIGH (ref 1.6–2.6)
MAGNESIUM: 3 mg/dL — ABNORMAL HIGH (ref 1.6–2.6)

## 2024-03-02 LAB — PHOSPHORUS
PHOSPHORUS: 5.2 mg/dL — ABNORMAL HIGH (ref 2.4–5.1)
PHOSPHORUS: 5.9 mg/dL — ABNORMAL HIGH (ref 2.4–5.1)

## 2024-03-02 LAB — PRO-BNP: PRO-BNP: 6988 pg/mL — ABNORMAL HIGH (ref <=300.0)

## 2024-03-02 LAB — CREATININE, URINE: CREATININE, URINE: 105.6 mg/dL

## 2024-03-02 LAB — SODIUM, URINE, RANDOM: SODIUM URINE: 32 mmol/L

## 2024-03-02 MED ADMIN — sodium chloride 3% (HYPERTONIC) infusion: 50 mL/h | INTRAVENOUS | @ 23:00:00

## 2024-03-02 MED ADMIN — pantoprazole (Protonix) injection 40 mg: 40 mg | INTRAVENOUS | @ 15:00:00

## 2024-03-02 MED ADMIN — acetaminophen (OFIRMEV) 10 mg/mL injection 1,000 mg: 1000 mg | INTRAVENOUS | @ 07:00:00 | Stop: 2024-03-03

## 2024-03-02 MED ADMIN — amiodarone 360 mg in dextrose 5% 200 mL (1.8 mg/mL) infusion PMB: .5 mg/min | INTRAVENOUS | @ 08:00:00

## 2024-03-02 MED ADMIN — budesonide (PULMICORT) nebulizer solution 0.5 mg: .5 mg | RESPIRATORY_TRACT | @ 03:00:00

## 2024-03-02 MED ADMIN — heparin (porcine) 5,000 unit/mL injection 5,000 Units: 5000 [IU] | SUBCUTANEOUS | @ 20:00:00

## 2024-03-02 MED ADMIN — acetaminophen (OFIRMEV) 10 mg/mL injection 1,000 mg: 1000 mg | INTRAVENOUS | @ 16:00:00 | Stop: 2024-03-03

## 2024-03-02 MED ADMIN — ipratropium (ATROVENT) 0.02 % nebulizer solution 500 mcg: 500 ug | RESPIRATORY_TRACT | @ 13:00:00

## 2024-03-02 MED ADMIN — albumin human 5 % 25 g: 25 g | INTRAVENOUS | @ 21:00:00 | Stop: 2024-03-02

## 2024-03-02 MED ADMIN — ipratropium (ATROVENT) 0.02 % nebulizer solution 500 mcg: 500 ug | RESPIRATORY_TRACT | @ 21:00:00

## 2024-03-02 MED ADMIN — heparin (porcine) 5,000 unit/mL injection 5,000 Units: 5000 [IU] | SUBCUTANEOUS | @ 03:00:00

## 2024-03-02 MED ADMIN — ipratropium (ATROVENT) 0.02 % nebulizer solution 500 mcg: 500 ug | RESPIRATORY_TRACT | @ 10:00:00

## 2024-03-02 MED ADMIN — budesonide (PULMICORT) nebulizer solution 0.5 mg: .5 mg | RESPIRATORY_TRACT | @ 13:00:00

## 2024-03-02 MED ADMIN — heparin (porcine) 5,000 unit/mL injection 5,000 Units: 5000 [IU] | SUBCUTANEOUS | @ 11:00:00

## 2024-03-02 MED ADMIN — piperacillin-tazobactam (ZOSYN) IVPB (premix) 4.5 g: 4.5 g | INTRAVENOUS | @ 18:00:00 | Stop: 2024-03-04

## 2024-03-02 MED ADMIN — acetaminophen (OFIRMEV) 10 mg/mL injection 1,000 mg: 1000 mg | INTRAVENOUS | @ 23:00:00 | Stop: 2024-03-03

## 2024-03-02 MED ADMIN — sodium chloride irrigation (NS) 0.9 % irrigation solution: Stop: 2024-03-01

## 2024-03-02 MED ADMIN — piperacillin-tazobactam (ZOSYN) 3.375 g in sodium chloride 0.9 % (NS) 100 mL IVPB-MBP: 3.375 g | INTRAVENOUS | @ 05:00:00 | Stop: 2024-03-02

## 2024-03-02 MED ADMIN — sugammadex (BRIDION) injection: INTRAVENOUS | Stop: 2024-03-01

## 2024-03-02 MED ADMIN — amiodarone 360 mg in dextrose 5% 200 mL (1.8 mg/mL) infusion PMB: .5 mg/min | INTRAVENOUS | @ 21:00:00

## 2024-03-02 MED ADMIN — ipratropium (ATROVENT) 0.02 % nebulizer solution 500 mcg: 500 ug | RESPIRATORY_TRACT | @ 03:00:00

## 2024-03-02 NOTE — Unmapped (Signed)
 STCCU PROGRESS NOTE     Date of Service: 03/02/2024    Hospital Day: LOS: 4 days        Surgery Date: TBD   Surgical Attending: Merri Ray, MD    Critical Care Attending: Thalia Party, MD    Interval History:   Abdomen remains open, potential  RTOR tomorrow for closure.    Remains HDS off pressors, hgb stable 11.1, out from [facility]. Remains in Afib w/ HR in 130s to 140s while on Amio gtt.     Remains intubated post-OR iso inability to close abdomen    History of Present Illness:   Tammy Braun is a 83 y.o. female with PMH bladder cancer with cystectomy and ileal conduit, COPD, recent dx of afib, moderate-severe TR, CAD (high calcification score), T2DM presented to [facility] with projectile vomiting and abdominal pain, found to have SBO with transition point at level of ileostomy on CT A/P. On initial presentation, labs remarkable for leukocytosis thought to be iso hemoconcentration, AKI on CKD, mildly elevated venous lactate improved with resuscitation. Admitted to [facility] for worsening hypoxia on HFNC requiring intubation in setting of hiatal hernia. OR on 02/28/24 for ex-lap, reduciton of parastomal hernia, small bowel resection with primary anastomosis, lysis of adhesions, abdomen left open with ABThera in place.     Hospital Course:  - 02/26/24: Admitted to [facility], floor status, consult to urology for possible ileal conduit revision.   - 02/27/24: Patient level of care escalated to step-down iso increased oxygen needs while in ED.  - 02/28/24: Upgraded to ICU. Intubated at bed-side. OR for ex-lap, reduction of parastomal hernia, small bowel resection with primary anastomosis, lysis of adhesions, abdomen left open with ABThera in place.      ASSESSMENT & PLAN:     Neurologic:  - Pain: Sch IV Tylenol, IV Robaxin (holiday currently), HM IV PRN 0.5-1mg   - Fent gtt and pushes  - Sedation: Propofol gtt     Cardiovascular:  #HTN, HLD:   - Chronic. Hemodynamically stable without vasoactive agent.   - MAP goal > 65  - Holding home ASA and Lipitor    #Afib/flutter w RVR:   - Prior hx of paroxysmal afib. CHADSvasc = 6, not anticoagulated PTA per chart review.   - IV amio (bolus + gtt) if HR > 150 after bolus  - Holding home diltiazem, question efficacy given likely poor absorption  - Echo 3/1: LVEF > 55%. Severe TR.  - Pro-BNP: Pending     Respiratory:  #Acute on chronic hypoxemia  #Hx of COPD:   - 2/28: Intubated  - Continue pulmonary toilet  - Home incruse ellipta   - PRN albuterol   - Daily SBT  - Daily ABG    FEN/GI:  F: Medlocked, bolus PRN  E: Replete electrolytes PRN  N: NPO   - Begin Tfs via NGT  - GI prophylaxis: zofran prn for nausea; IV Protonix   - Bowel regimen: held iso bowel obstruction     #S/p ex-lap, reduction of parastomal hernia, small bowel resection with primary anastomosis, lysis of adhesions, abdomen left open with ABThera in place 2/28    #Large hiatal hernia, chronic  - Non-operative, chronic    Renal/Genitourinary:  #AKI on CKD:   - Cr 2.4 < 2.2 (Baseline 1.4)  - FeNa 2/27 1.9% (Indeterminate)  - Repeat FeNa given persistently elevated Cr    #Hydronephrosis:  - Caution with perioperative fluid resuscitation per Cards   - Consideration for c/s  to nephrology iso hyperK, CKD   - If persistent AKI after closure consider renal ultrasound to assess hydro/obstruction    #H/o bladder cancer s/p radical cystectomy with ileal conduit (2012):  #Parastomal hernia:  - SRU consulted, following   - Strict I&Os  - Foley in conduit, low UOP  - Tentative plan for OR tomorrow for urostomy revision/abdominal closure    Endocrine:   - Glycemic Control: SSI    Hematologic:   - Monitor CBC daily, transfuse products as indicated    Immunologic/Infectious Disease:  - Afebrile, leukocytosis 13.1 < 5.8 < 14.1, continue to monitor  - Cultures:   - Ucx (2/26): Mixed GP/GN organisms    - RPP (2/27): negative   - Abx:    - Zosyn x4d (3/1 - 3/5)    Musculoskeletal:   NAI    Daily Care Checklist:   - Stress Ulcer Prevention: Yes: Mechanically ventilated  - DVT Prophylaxis: Chemical: SQ Heparin  - Daily Awakening: Yes  - Spontaneous Breathing Trial:  post-op  - Indication for Central/PICC Line: Yes  Infusions requiring central access, Hemodynamic monitoring, and Inadequate peripheral access, RIJ triple lumen  - Indication for Urinary Catheter: Yes  Strict intake and output, Agressive diuresis/hydration, and Urinary retention/obstruction  - Diagnostic images/reports of past 24hrs reviewed: Yes    Disposition:   - Continue ICU care  - PT/OT consulted:  will consult post OR    Oliver Hum, MD   Surgical Intensive Care Unit  [place] [place] [facility]       SUBJECTIVE:      Intubated/sedated     OBJECTIVE:     Physical Exam:  Constitutional: Lying supine, NAD  Neurologic: Sedated, +cough/gag/corneal reflexe   Respiratory: Mechanically ventilated via ETT   Cardiovascular: Tachycardic, irregular rhythm, no peripheral edema to LE   Gastrointestinal: Mildly distended abdomen, ABThera in place draining SS fluid. Urostomy with Foley in stoma draining clear yellow urine.   Musculoskeletal: Trace to no BLE edema.  Skin: Warm and dry     Temp:  [36.4 ??C (97.5 ??F)-37.2 ??C (99 ??F)] 37.1 ??C (98.7 ??F)  Heart Rate:  [120-139] 126  SpO2 Pulse:  [80-131] 122  Resp:  [12-21] 18  BP: (78-138)/(56-99) 97/56  MAP (mmHg):  [63-112] 68  FiO2 (%):  [40 %-60 %] 50 %  SpO2:  [96 %-100 %] 99 %       Recent Laboratory Results:  Recent Labs     Units 03/02/24  0051   PHART  7.34*   PCO2ART mm[Hg] 39.6   PO2ART mm[Hg] 103   HCO3ART mmol/L 21.6*   BEART mmol/L -4.1*   O2SATART % 98.5     Recent Labs     Units 03/01/24  1717 03/01/24  2057 03/01/24  2357 03/02/24  0051 03/02/24  0402   NA mmol/L 143 141 - 141 140 141 142   K mmol/L 4.8 4.6 - 4.3 4.6 4.4 4.7   CL mmol/L 105 106  --   --  105   CO2 mmol/L 24.0 22.0  --   --  22.0   BUN mg/dL 66* 60*  --   --  68*   CREATININE mg/dL 1.61* 0.96*  --   --  0.45*   GLU mg/dL 409* 811  --   --  914     Lab Results   Component Value Date    BILITOT 0.5 02/26/2024    BILITOT 0.3 01/06/2024    ALT  12 03/01/2024    ALT 20 02/26/2024    AST 12 03/01/2024    AST 31 02/26/2024    GGT 16 09/17/2011    GGT 354 (H) 05/03/2011    ALKPHOS 101 02/26/2024    ALKPHOS 90 01/06/2024    PROT 9.2 (H) 02/26/2024    PROT 7.5 01/06/2024    ALBUMIN 4.5 02/26/2024    ALBUMIN 3.2 (L) 01/06/2024     Recent Labs     Units 03/02/24  0134 03/02/24  0521   POCGLU mg/dL 914 782     Recent Labs     Units 03/01/24  1717 03/01/24  2057 03/02/24  0051 03/02/24  0402   WBC 10*9/L 10.7 10.9  --  10.9   RBC 10*12/L 3.74* 4.06  --  3.96   HGB g/dL 95.6* 21.3 08.6* 57.8*   HCT % 32.3* 35.2  --  34.3   MCV fL 86.5 86.8  --  86.5   MCH pg 27.9 28.1  --  28.1   MCHC g/dL 46.9 62.9  --  52.8   RDW % 18.3* 18.4*  --  18.1*   PLT 10*9/L 182 199  --  208   MPV fL 8.5 8.4  --  8.5     No results for input(s): INR, APTT in the last 24 hours.    Invalid input(s): PTPATIENT   Lines & Tubes:   Patient Lines/Drains/Airways Status       Active Peripheral & Central Intravenous Access       Name Placement date Placement time Site Days    Peripheral IV 02/26/24 Anterior;Right;Upper Arm 02/26/24  1916  Arm  4    CVC Triple Lumen 02/28/24 Non-tunneled Right Internal jugular 02/28/24  1641  Internal jugular  2                     Patient Lines/Drains/Airways Status       Active Wounds       Name Placement date Placement time Site Days    Negative Pressure Wound Therapy Abdomen Mid 02/28/24  2115  Abdomen  2    Surgical Site 04/08/18 Shoulder Left 04/08/18  1338  -- 2154    Surgical Site 06/05/22 Eye Left 06/05/22  0928  -- 635    Surgical Site 06/19/22 Eye Right 06/19/22  1024  -- 621    Surgical Site 02/28/24 Abdomen 02/28/24  2055  -- 2    Surgical Site 03/01/24 Abdomen 03/01/24  1902  -- less than 1    Wound 06/24/20 Other (comment) Buttocks inner right buttock 06/24/20  1010  Buttocks  1346                     Respiratory/ventilator settings for last 24 hours:   Vent Mode: PRVC  FiO2 (%): 50 %  S RR: 18  S VT: 330 mL  PEEP: 8 cm H20  PR SUP: 5 cm H20    Intake/Output last 3 shifts:  I/O last 3 completed shifts:  In: 1242 [I.V.:906.9; IV Piggyback:335.1]  Out: 1787 [Urine:485; Emesis/NG output:25; Blood:2]    Daily/Recent Weight:  68.6 kg (151 lb 3.8 oz)    BMI:  Body mass index is 28.59 kg/m??.                                  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    Chronic kidney disease     Depression, psychotic (CMS-HCC)     Diabetes mellitus (CMS-HCC)     in past    Emphysema of lung (CMS-HCC)     Financial difficulties     Frail elderly     Hearing impairment     Hernia     History of transfusion     Hyperlipidemia     Hypertension     Impaired mobility     Osteoporosis     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/08/2015    Visual impairment      Past Surgical History:   Procedure Laterality Date    ABDOMINAL SURGERY      BLADDER SURGERY      BREAST CYST EXCISION      CHEMOTHERAPY  2012    bladder    GALLBLADDER SURGERY      stone removal    ILEOSTOMY  2012    PR COLONOSCOPY FLX DX W/COLLJ SPEC WHEN PFRMD N/A 02/09/2015    Procedure: COLONOSCOPY, FLEXIBLE, PROXIMAL TO SPLENIC FLEXURE; DIAGNOSTIC, W/WO COLLECTION SPECIMEN BY BRUSH OR WASH;  Surgeon: Dewaine Conger, MD;  Location: [facility]EDURES [place];  Service: Gastroenterology    PR RECONSTR TOTAL SHOULDER IMPLANT Left 04/08/2018    Procedure: ARTHROPLASTY, GLENOHUMERAL JOINT; TOTAL SHOULDER(GLENOID & PROXIMAL HUMERAL REPLACEMENT(EG, TOTAL SHOULDER);  Surgeon: Tomasa Rand, MD;  Location: [facility] OR [facility];  Service: Ortho Sports Medicine    PR SIGMOIDOSCOPY,BIOPSY N/A 03/11/2015    Procedure: SIGMOIDOSCOPY, FLEXIBLE; WITH BIOPSY, SINGLE OR MULTIPLE;  Surgeon: Wilburt Finlay, MD;  Location: [facility];  Service: Gastroenterology    PR XCAPSL CTRC RMVL INSJ IO LENS PROSTH W/O ECP Left 06/05/2022    Procedure: EXTRACAPSULAR CATARACT REMOVAL W/INSERTION OF INTRAOCULAR LENS PROSTHESIS, MANUAL OR MECHANICAL TECHNIQUE WITHOUT ENDOSCOPIC CYCLOPHOTOCOAGULATION;  Surgeon: Garner Gavel, MD;  Location: [facility] OR [facility];  Service: Ophthalmology    PR XCAPSL CTRC RMVL INSJ IO LENS PROSTH W/O ECP Right 06/19/2022    Procedure: EXTRACAPSULAR CATARACT REMOVAL W/INSERTION OF INTRAOCULAR LENS PROSTHESIS, MANUAL OR MECHANICAL TECHNIQUE WITHOUT ENDOSCOPIC CYCLOPHOTOCOAGULATION;  Surgeon: Garner Gavel, MD;  Location: [facility] OR [facility];  Service: Ophthalmology     Scheduled Medications:   acetaminophen  1,000 mg Intravenous Q8H    [Provider Hold] aspirin  81 mg Oral Daily    [Provider Hold] atorvastatin  40 mg Oral Daily    budesonide (PULMICORT) nebulizer solution  0.5 mg Nebulization BID (RT)    [Provider Hold] dilTIAZem  120 mg Oral Daily    flu vacc [id number](9yr up)-PF  0.5 mL Intramuscular During hospitalization    [Provider Hold] fluPHENAZine decanoate  50 mg Intramuscular Q30 Days    [Provider Hold] furosemide  20 mg Oral Daily    [Provider Hold] gabapentin  100 mg Oral TID    heparin (porcine) for subcutaneous use  5,000 Units Subcutaneous Q8H SCH    insulin lispro  0-20 Units Subcutaneous Q4H SCH    ipratropium  500 mcg Nebulization Q6H (RT)    pantoprazole (Protonix) intravenous solution  40 mg Intravenous Daily    piperacillin-tazobactam  3.375 g Intravenous Q12H    [Provider Hold] umeclidinium  1 puff Inhalation Daily (RT)  Continuous Infusions:   amiodarone 0.5 mg/min (03/02/24 0324)    fentaNYL citrate (PF) 50 mcg/mL infusion Stopped (03/02/24 0210)    propofol 10 mg/mL infusion Stopped (02/29/24 0718)     PRN Medications:  albuterol, [Provider Hold] cyclobenzaprine, dextrose in water, fentaNYL (PF) **OR** fentaNYL (PF), glucagon, HYDROmorphone **OR** HYDROmorphone, HYDROmorphone, nicotine polacrilex **OR** nicotine polacrilex, ondansetron, oxyCODONE **OR** oxyCODONE

## 2024-03-02 NOTE — Unmapped (Signed)
 Adult Nutrition Assessment Note    Visit Type: MD Consult  Reason for Visit: Parenteral Nutrition    NUTRITION INTERVENTIONS and RECOMMENDATION     Recommend Vital AF 1.2 Cal at goal rate 60 mL/hr. This provides 1512 kcals, 95 g protein, 140 g carbohydrate, 68 g fat, 7 g fiber, 1021 mL free water, and meets 101% USRDI.  Start at 66ml/hr and increase 30ml q4hrs as tolerated to goal  If unable to tolerate tube feed rate advancement, start PN 3/4 given prolonged NPO.    NUTRITION ASSESSMENT     Patient has received minimal nutrition over the past 5 days due to Open abdomen in discontinuity   Patient appropriate for enteral nutrition support given mechanical ventilation.     NUTRITIONALLY RELEVANT DATA     HPI & PMH:   55 YOF N/V and abdominal distention 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 On takeback on 3/2, Urology interrogated stoma with no signs of leak. Catheter placed in stoma. Unable to close patient 2/2 persistent small bowel dilation and patient instability.     Nutrition History:   Pt seen on daily SICU rounds. NPO x 5 days s/p above. Plan to start tube feeds now that abdomen is in continuity.    Medications:  Nutritionally pertinent medications reviewed and evaluated for potential food and/or medication interactions.     Labs:   Nutritionally pertinent labs reviewed.     Nutritional Needs:   Daily Estimated Nutrient Needs:  Energy: 1525  1830 kcals 22-25 kcal/kg using estimated dry weight, 61 kg (03/02/24 1026)]  Protein: 75-125 gm [1.5-2.0 gm/kg, 1.2-1.5 gm/kg using estimated dry weight, 61 kg (03/02/24 1026)]  Carbohydrate:   [ ]   Fluid:   mL [ ]     Anthropometric Data:  Height: 154.9 cm (5' 0.98)   Admission weight: 65.1 kg (143 lb 8.3 oz)  Last recorded weight: 68.6 kg (151 lb 3.8 oz)  Date of last recorded weight: 3/3  IBW: 47.68 kg  BMI: Body mass index is 28.59 kg/m??.   Usual Body Weight: est 61kg  Weight Assessment: fluid/bed wt gain    Malnutrition Assessment:  Malnutrition assessment not yet completed at this time due to lack of nutrition history and inability to complete nutrition focused physical exam (NFPE).     Nutrition Focused Physical Exam:  Unable to complete at this time due to staff providing patient care    Care plan:  Not completed, unable to diagnose malnutrition at this time    Current Nutrition:  Enteral nutrition via NG tube  Nutrition Orders            Vital AF (Semi-Elemental) continuous tube feed starting at 03/03 1300    NPO Sips with meds; Medically necessary: NPO starting at 02/27 0423            Nutritionally Pertinent Allergies, Intolerances, Sensitivities, and/or Cultural/Religious Restrictions:  none identified at this time     GOALS and EVALUATION     Patient to meet 65% or greater of nutritional needs via enteral nutrition within the first week of ICU admission. - New    Motivation, Barriers, and Compliance:  Evaluation of motivation, barriers, and compliance pending at this time due to clinical status.     Discharge Planning:   Monitor for potential discharge needs with multi-disciplinary team.  Follow-Up Parameters:   1-2 times per week (and more frequent as indicated)    Arcola Jansky MS, RD, LDN, CNSC

## 2024-03-02 NOTE — Unmapped (Addendum)
 Patient stable on current vent settings. Failed SBT, RSBI 122 with increased WOB. ETT secure.    Vent Mode: PRVC  FiO2 (%): 50 %  S RR: 18  S VT: 330 mL  PEEP: 8 cm H20    Recent Labs     Units 03/02/24  0051   PHART  7.34*   PCO2ART mm[Hg] 39.6   PO2ART mm[Hg] 103   HCO3ART mmol/L 21.6*   BEART mmol/L -4.1*   O2SATART % 98.5

## 2024-03-02 NOTE — Unmapped (Signed)
 UROLOGY CONSULT NOTE    Requesting Attending Physician:  Merri Ray, MD  Service Requesting Consult:  Peter Garter [facility])  Service Providing Consult: SRU  Consulting Attending: Dr. Katrinka Blazing     Assessment:  Patient is a 83 y.o. female with hx of bladder cancer s/p [facility]IC on 10/05/2011 for T2N0M0 with known parastomal hernia (previously stable and reducible) who presented to [facility] with N/V and abdominal distention 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     On evaluation, patient is stable but requiring large bore [place]. Labs notable for WBC 11.4 (down from 19 at admission) and elevated Cr to 2.63 (baseline ~1). UA demonstrates moderate LE, 42 WBCs, moderate bacteria (expected to be dirty). Urine culture pending. CTAP with findings of SBO as described above. Also with stable mild BL hydronephrosis.    In setting of stable hydronephrosis, my concern for an obstruction is relatively low, and her AKI is more likely related to poor PO intake and emesis. However, we will continue to monitor Cr and consider additional imaging if renal function does not improve. Of note, last loopogram negative for stricture or obstruction (11/08/22).     Patient was taken to OR on 2/28 for ex-lap LOA, small bowel resection fistula takedown with primary repair, left open. On takeback on 3/2, Urology interrogated stoma with no signs of leak. Catheter placed in stoma. Unable to close patient 2/2 persistent small bowel dilation and patient instability.     Interval 3/3: Remains AF, in Afib with RVR. MAPs range from 60-100. Off pressors. UOP remains low, charted as 380ccs over that last 24 hours. Cr 2.4. Failed SBT, remains intubated.     Recommendations:  Continue foley catheter per stoma, if it falls out notify urology to replace.  We will continue to follow.     Thank you for this consult. Please page [phone] with any questions or concerns.    History of Present Illness:   Tammy Braun is seen in consultation for SBO at the request of Merri Ray, MD on the [facility]).     Endorses 1 day history of worsening abdominal pain associated with N/V. Feels constipation and has not had BM in at least a day. Minimal PO intake.     Denies change in urine output. Does not feel like her urine has been malodourous or has not noted decreased output from ostomy.     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    Chronic kidney disease     Depression, psychotic (CMS-HCC)     Diabetes mellitus (CMS-HCC)     in past    Emphysema of lung (CMS-HCC)     Financial difficulties     Frail elderly     Hearing impairment     Hernia     History of transfusion     Hyperlipidemia     Hypertension     Impaired mobility     Osteoporosis     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/08/2015    Visual impairment        Past Surgical History:   Past Surgical History:   Procedure Laterality Date    ABDOMINAL SURGERY      BLADDER SURGERY  BREAST CYST EXCISION      CHEMOTHERAPY  2012    bladder    GALLBLADDER SURGERY      stone removal    ILEOSTOMY  2012    PR COLONOSCOPY FLX DX W/COLLJ SPEC WHEN PFRMD N/A 02/09/2015    Procedure: COLONOSCOPY, FLEXIBLE, PROXIMAL TO SPLENIC FLEXURE; DIAGNOSTIC, W/WO COLLECTION SPECIMEN BY BRUSH OR WASH;  Surgeon: Dewaine Conger, MD;  Location: [facility] PROCEDURES [place];  Service: Gastroenterology    PR RECONSTR TOTAL SHOULDER IMPLANT Left 04/08/2018    Procedure: ARTHROPLASTY, GLENOHUMERAL JOINT; TOTAL SHOULDER(GLENOID & PROXIMAL HUMERAL REPLACEMENT(EG, TOTAL SHOULDER);  Surgeon: Tomasa Rand, MD;  Location: [facility] OR [facility];  Service: Ortho Sports Medicine    PR SIGMOIDOSCOPY,BIOPSY N/A 03/11/2015    Procedure: SIGMOIDOSCOPY, FLEXIBLE; WITH BIOPSY, SINGLE OR MULTIPLE;  Surgeon: Wilburt Finlay, MD;  Location: [facility];  Service: Gastroenterology    PR XCAPSL CTRC RMVL INSJ IO LENS PROSTH W/O ECP Left 06/05/2022    Procedure: EXTRACAPSULAR CATARACT REMOVAL W/INSERTION OF INTRAOCULAR LENS PROSTHESIS, MANUAL OR MECHANICAL TECHNIQUE WITHOUT ENDOSCOPIC CYCLOPHOTOCOAGULATION;  Surgeon: Garner Gavel, MD;  Location: [facility] OR [facility];  Service: Ophthalmology    PR XCAPSL CTRC RMVL INSJ IO LENS PROSTH W/O ECP Right 06/19/2022    Procedure: EXTRACAPSULAR CATARACT REMOVAL W/INSERTION OF INTRAOCULAR LENS PROSTHESIS, MANUAL OR MECHANICAL TECHNIQUE WITHOUT ENDOSCOPIC CYCLOPHOTOCOAGULATION;  Surgeon: Garner Gavel, MD;  Location: [facility] OR [facility];  Service: Ophthalmology       Medication:  Current Facility-Administered Medications   Medication Dose Route Frequency Provider Last Rate Last Admin    acetaminophen (OFIRMEV) 10 mg/mL injection 1,000 mg  1,000 mg Intravenous Q8H Oliver Hum, MD   Stopped at 03/02/24 0222    albuterol 2.5 mg /3 mL (0.083 %) nebulizer solution 2.5 mg  2.5 mg Nebulization Q6H PRN Oletha Blend B, MD   2.5 mg at 02/28/24 1322    amiodarone 360 mg in dextrose 5% 200 mL (1.8 mg/mL) infusion PMB  0.5 mg/min Intravenous Continuous Oletha Blend B, MD 16.7 mL/hr at 03/02/24 0324 0.5 mg/min at 03/02/24 0324    [Provider Hold] aspirin chewable tablet 81 mg  81 mg Oral Daily Oletha Blend B, MD   81 mg at 02/28/24 0912    [Provider Hold] atorvastatin (LIPITOR) tablet 40 mg  40 mg Oral Daily Oletha Blend B, MD   40 mg at 02/28/24 0912    budesonide (PULMICORT) nebulizer solution 0.5 mg  0.5 mg Nebulization BID (RT) Colvin Caroli, PA   0.5 mg at 03/01/24 2210    [Provider Hold] cyclobenzaprine (FLEXERIL) tablet 5 mg  5 mg Oral BID PRN Oletha Blend B, MD        dextrose (D10W) 10% bolus 125 mL  12.5 g Intravenous Q10 Min PRN Alexander Mt, MD        [Provider Hold] dilTIAZem (CARDIZEM CD) 24 hr capsule 120 mg  120 mg Oral Daily Festus Holts, Kian B, MD   120 mg at 02/28/24 0912    fentaNYL (PF) (SUBLIMAZE) injection 25 mcg  25 mcg Intravenous Q30 Min PRN Oletha Blend B, MD        Or    fentaNYL (PF) (SUBLIMAZE) injection 50 mcg  50 mcg Intravenous Q30 Min PRN Oletha Blend B, MD   50 mcg at 02/28/24 1551    fentaNYL PF (SUBLIMAZE) (50 mcg/mL) infusion (bag)  0-200 mcg/hr Intravenous Continuous Maryann Alar, [place]   Stopped at 03/02/24 0210  flu vaccine [id number] (52YR UP)(PF)(FLUZONE HIGH DOSE)  0.5 mL Intramuscular During hospitalization [name]., MD        [Provider Hold] fluPHENAZine decanoate (PROLIXIN) injection 50 mg  50 mg Intramuscular Q30 Days Alexander Mt, MD        [Provider Hold] furosemide (LASIX) tablet 20 mg  20 mg Oral Daily Alexander Mt, MD        [Provider Hold] gabapentin (NEURONTIN) capsule 100 mg  100 mg Oral TID Festus Holts, Kian B, MD        glucagon injection 1 mg  1 mg Intramuscular Once PRN Oletha Blend B, MD        heparin (porcine) 5,000 unit/mL injection 5,000 Units  5,000 Units Subcutaneous [facility] Lala Lund, ACNP   5,000 Units at 03/02/24 0533    HYDROmorphone (PF) (DILAUDID) injection Syrg 0.5 mg  0.5 mg Intravenous Q4H PRN Gracelyn Nurse L, NP        Or    HYDROmorphone (DILAUDID) injection 1 mg  1 mg Intravenous Q4H PRN Gracelyn Nurse L, NP        HYDROmorphone (PF) (DILAUDID) injection Syrg 0.2 mg  0.2 mg Intravenous Q2H PRN Oletha Blend B, MD        insulin lispro (HumaLOG) injection 0-20 Units  0-20 Units Subcutaneous Q4H [employer] Oletha Blend B, MD   1 Units at 02/29/24 1839    ipratropium (ATROVENT) 0.02 % nebulizer solution 500 mcg  500 mcg Nebulization Q6H (RT) Colvin Caroli, PA   500 mcg at 03/02/24 0500    nicotine polacrilex (NICORETTE) gum 4 mg  4 mg Buccal Q1H PRN Alexander Mt, MD        Or    nicotine polacrilex (NICORETTE) lozenge 4 mg  4 mg Buccal Q1H PRN Oletha Blend B, MD        ondansetron (ZOFRAN) injection 4 mg  4 mg Intravenous Q6H PRN Oletha Blend B, MD        oxyCODONE (ROXICODONE) immediate release tablet 5 mg  5 mg Oral Q4H PRN Clide Cliff, NP        Or    oxyCODONE (ROXICODONE) immediate release tablet 10 mg  10 mg Oral Q4H PRN Clide Cliff, NP        pantoprazole (Protonix) injection 40 mg  40 mg Intravenous Daily Lala Lund, ACNP   40 mg at 03/01/24 1015    piperacillin-tazobactam (ZOSYN) 3.375 g in sodium chloride 0.9 % (NS) 100 mL IVPB-MBP  3.375 g Intravenous Q12H Lala Lund, ACNP   Stopped at 03/02/24 0404    propofol (DIPRIVAN) infusion 10 mg/mL  0-50 mcg/kg/min Intravenous Continuous Lala Lund, ACNP   Paused at 02/29/24 5409    [Provider Hold] umeclidinium (INCRUSE ELLIPTA) 62.5 mcg/actuation inhaler 1 puff  1 puff Inhalation Daily (RT) Oletha Blend B, MD   1 puff at 02/28/24 1236     Facility-Administered Medications Ordered in Other Encounters   Medication Dose Route Frequency Provider Last Rate Last Admin    EPINEPHrine 100 mcg/10 mL (10 mcg/mL) injection   Intravenous PRN (once a day) Ichite, Precious C, MD   10 mcg at 02/28/24 1511    Propofol (DIPRIVAN) injection   Intravenous PRN (once a day) Ichite, Precious C, MD   60 mg at 02/28/24 1507    succinylcholine (ANECTINE) injection   Intravenous PRN (once a day) Ichite, Precious C, MD   60 mg at 02/28/24 1507  Allergies:  Allergies   Allergen Reactions    Lisinopril Anaphylaxis and Swelling    Losartan Dizziness    Hctz [Hydrochlorothiazide]      SIADH       Social History:  Social History     Tobacco Use    Smoking status: Every Day     Current packs/day: 1.00     Average packs/day: 1 pack/day for 65.9 years (65.9 ttl pk-yrs)     Types: Cigarettes     Start date: 04/09/1958    Smokeless tobacco: Never    Tobacco comments:     15cpd   Vaping Use    Vaping status: Never Used   Substance Use Topics    Alcohol use: No     Alcohol/week: 0.0 standard drinks of alcohol    Drug use: No       Family History:  Family History   Problem Relation Age of Onset    Breast cancer Daughter 31    Diabetes Mother     Glaucoma Father     Colon cancer Neg Hx     Ovarian cancer Neg Hx     Endometrial cancer Neg Hx     Anesthesia problems Neg Hx     Bleeding Disorder Neg Hx        Review of Systems:  10 systems were reviewed and are negative except as noted specifically in the HPI.    Objective:     Intake/Output last 3 shifts:  I/O last 3 completed shifts:  In: 1242 [I.V.:906.9; IV Piggyback:335.1]  Out: 1787 [Urine:485; Emesis/NG output:25; Blood:2]  Vital signs in last 24 hours:  BP 97/56  - Pulse 133  - Temp 37.1 ??C (98.7 ??F) (Axillary)  - Resp 18  - Ht 154.9 cm (5' 0.98)  - Wt 68.6 kg (151 lb 3.8 oz)  - SpO2 99%  - BMI 28.59 kg/m??     Physical Exam:  General:  Critically ill appearing  HEENT: ETT in place, NGT in place  Neck  Trachea midline, symmetrical  Lungs:   Ventilated on PSV  Cardiac: Tachycardic, in afib  Abdomen: Distended, abthera in place,  GU:   Ileal conduit, stoma is pink, no longer prolapsed, foley in place, straw colored urine in ostomy bag  Extremities: Warm and well perfused  Neuro:             intubated, sedated      Most Recent Labs:  Recent Labs     Units 03/01/24  1717 03/01/24  2057 03/02/24  0051 03/02/24  0402   WBC 10*9/L 10.7 10.9  --  10.9   RBC 10*12/L 3.74* 4.06  --  3.96   HGB g/dL 24.2* 35.3 61.4* 43.1*   HCT % 32.3* 35.2  --  34.3   MCV fL 86.5 86.8  --  86.5   MCH pg 27.9 28.1  --  28.1   MCHC g/dL 54.0 08.6  --  76.1   RDW % 18.3* 18.4*  --  18.1*   PLT 10*9/L 182 199  --  208   MPV fL 8.5 8.4  --  8.5     Recent Labs     Units 03/01/24  1717 03/01/24  2057 03/01/24  2357 03/02/24  0051 03/02/24  0402   NA mmol/L 143 141 - 141 140 141 142   K mmol/L 4.8 4.6 - 4.3 4.6 4.4 4.7   CL mmol/L 105 106  --   --  105  CO2 mmol/L 24.0 22.0  --   --  22.0   BUN mg/dL 66* 60*  --   --  68*   CREATININE mg/dL 1.61* 0.96*  --   --  0.45*   GLU mg/dL 409* 811  --   --  914   MG mg/dL 2.9* 3.0*  --   --  2.9*     Recent Labs     Units 02/26/24  1914 03/01/24  0335   ALT U/L 20 12   AST U/L 31 12   ALKPHOS U/L 101  --    ALBUMIN g/dL 4.5  --    PROT g/dL 9.2*  --    BILITOT mg/dL 0.5  --      No results for input(s): INR, APTT in the last 168 hours.    Microbiology Data:  No results found for: LABBLOO  Urine Culture, Comprehensive   Date Value Ref Range Status   02/26/2024 (A)  Final    Mixed Gram Positive/Gram Negative Organisms Isolated   05/10/2022 >100,000 CFU/mL Escherichia coli (A)  Final   05/10/2022 50,000 to 100,000 CFU/mL Pseudomonas aeruginosa (A)  Final     Comment:     Susceptibility Testing By Consultation Only   05/10/2022 50,000 to 100,000 CFU/mL Mixed Urogenital Flora (A)  Final   06/22/2020 (A)  Final    Mixed Gram Positive/Gram Negative Organisms Isolated   02/16/2020 (A)  Final    Mixed Gram Positive/Gram Negative Organisms Isolated   11/26/2016 >100,000 CFU/mL Klebsiella pneumoniae (A)  Final   11/26/2016 10,000 to 50,000 CFU/mL Pseudomonas aeruginosa (A)  Final     Comment:     Susceptibility Testing By Consultation Only   08/02/2013 (A)  Final    30,000 CFU/ML MIXED UROGENITAL FLORA  Reference Range: (Lower detectable limit 1000 cfu/ml)  (Lower detectable limit for Acute dysuria, Suprapubic tap,  and Post-prostatic massage specimen types = 100 cfu/ml)   08/02/2013   Final    >100,000 CFU/mL  Testing results predict SUSCEPTIBILITY for the oral agents  cefdinir, cefuroxime, and cephalexin when used for therapy of  uncomplicated UTIs due to this organism.   07/07/2013   Final    MIXED GRAM POSITIVE/NEGATIVE ORGANISMS ISOLATED  Reference Range: (Lower detectable limit 1000 cfu/ml)  (Lower detectable limit for Acute dysuria, Suprapubic tap,  and Post-prostatic massage specimen types = 100 cfu/ml)   09/17/2011 FINAL  MIXED UROGENITAL FLORA  Final     Comment:     Reference Range: (Lower detectable limit 1000 cfu/ml)  (Lower detectable limit for Acute dysuria, Suprapubic tap,  and Post-prostatic massage specimen types = 100 cfu/ml)   03/23/2011 FINAL  MIXED UROGENITAL FLORA  Final     Comment:     Reference Range: (Lower detectable limit 1000 cfu/ml)  (Lower detectable limit for Acute dysuria, Suprapubic tap,  and Post-prostatic massage specimen types = 100 cfu/ml)   03/18/2011 FINAL  NO GROWTH  Final     Comment:     Reference Range: (Lower detectable limit 1000 cfu/ml)  (Lower detectable limit for Acute dysuria, Suprapubic tap,  and Post-prostatic massage specimen types = 100 cfu/ml)     No results found for: ANACX  Most recent Urinalysis:  Recent Labs     Units 02/26/24  2313   LEUKOCYTESUR  Moderate*   NITRITE  Negative   RBCUA /HPF 42*   WBCUA /HPF 71*   SQUEPIU /HPF 3   BACTERIA /HPF Moderate*      Urinalysis History:  Leukocyte Esterase, UA   Date Value Ref Range Status   02/26/2024 Moderate (A) Negative Final   05/10/2022 Large (A) Negative Final   06/22/2020 Moderate (A) Negative Final   02/16/2020 Moderate (A) Negative Final   11/26/2016 Moderate (A) Negative Final   08/02/2013 +1 NEGATIVE Final   07/07/2013 +2 NEGATIVE Final   09/17/2011 +2 NEGATIVE Final   03/18/2011 NEGATIVE NEGATIVE Final     Nitrite, UA   Date Value Ref Range Status   02/26/2024 Negative Negative Final   05/10/2022 Positive (A) Negative Final   06/22/2020 Negative Negative Final   02/16/2020 Positive (A) Negative Final   11/26/2016 Negative Negative Final   08/02/2013 NEGATIVE NEGATIVE Final   07/07/2013 NEGATIVE NEGATIVE Final   09/17/2011 NEGATIVE NEGATIVE Final   03/18/2011 NEGATIVE NEGATIVE Final     RBC, UA   Date Value Ref Range Status   02/26/2024 42 (H) <=4 /HPF Final   05/10/2022 4 <=4 /HPF Final   06/22/2020 2 <=4 /HPF Final   02/16/2020 3 <4 /HPF Final   11/26/2016 21 (H) <4 /HPF Final     WBC, UA   Date Value Ref Range Status   02/26/2024 71 (H) 0 - 5 /HPF Final   05/10/2022 6 (H) 0 - 5 /HPF Final   06/22/2020 25 (H) 0 - 5 /HPF Final   02/16/2020 10 (H) 0 - 5 /HPF Final   11/26/2016 12 (H) 0 - 5 /HPF Final     Squam Epithel, UA   Date Value Ref Range Status   02/26/2024 3 0 - 5 /HPF Final   05/10/2022 1 0 - 5 /HPF Final   06/22/2020 <1 0 - 5 /HPF Final   02/16/2020 2 0 - 5 /HPF Final   11/26/2016 <1 0 - 5 /HPF Final   09/17/2011 3 /HPF Final   03/18/2011 <1 /HPF Final     Bacteria, UA   Date Value Ref Range Status   02/26/2024 Moderate (A) None Seen /HPF Final   05/10/2022 Few (A) None Seen /HPF Final   06/22/2020 Many (A) None Seen /HPF Final   02/16/2020 Few (A) None Seen /HPF Final   11/26/2016 Many (A) None Seen /HPF Final   08/02/2013 FEW  Final   07/07/2013 MANY  Final   03/18/2011 RARE  Final        Imaging:  XR Chest Portable  Result Date: 03/02/2024  EXAM: XR CHEST PORTABLE ACCESSION: [id number] UN REPORT DATE: 03/02/2024 7:02 AM     CLINICAL INDICATION: ETT (VENTILATOR/RESPIRATOR DEP STATUS)      TECHNIQUE: Single View AP Chest Radiograph.     COMPARISON: 03/01/2024 9 5:06 a.m.     FINDINGS:     Unchanged support devices.     Large hiatal hernia with associated bilateral lower lung atelectasis. No pleural effusion or pneumothorax.     Unchanged cardiomediastinal silhouette.             No acute abnormalities.    XR Abdomen 1 View  Result Date: 03/01/2024  EXAM: XR ABDOMEN 1 VIEW ACCESSION: [id number] UN REPORT DATE: 03/01/2024 11:17 PM     CLINICAL INDICATION: 83 years old with NGT (CATHETER VASCULAR FIT & ADJ)      COMPARISON: Abdominal radiograph 02/27/2024, CT abdomen pelvis 02/26/2024     TECHNIQUE: Supine view of the abdomen, 1 image(s)     FINDINGS: Endotracheal tube tip overlying the mid to distal thoracic aorta. Right IJ central venous catheter with tip overlying the right atrium. Esophagogastric  tube with course overlying the right hilum and likely large hiatal hernia.     Atelectasis of the bilateral lower lobes and right midlung. Left shoulder arthroplasty. Degenerative changes of the spine.         Esophagogastric tube overlying the right hilum and likely large hiatal hernia. Recommend correlation with aspiration to assess for gastric positioning.     The findings of this study were discussed via telephone with DR. Con Memos by Dr. Evelina Bucy on 03/01/2024 11:24 PM.             Echocardiogram W Colorflow Spectral Doppler  Result Date: 03/01/2024  Patient Info Name:     Tammy Braun Age:     82 years DOB:     26-Jan-1941 Gender:     Female MRN:     [id number] Accession #:     [id number] UN Account #:     [id number] Ht:     155 cm Wt:     65 kg BSA:     1.69 m2 BP:     91 /     73 mmHg HR:     100 bpm Heart Rhythm:     Atrial Fibrillation Exam Date:     03/01/2024 11:07 AM Admit Date:     02/26/2024     Exam Type:     ECHOCARDIOGRAM W COLORFLOW SPECTRAL DOPPLER     Technical Quality:     Fair     Staff Sonographer:     Sedonia Small Rodgers-Jones     Study Info Indications      - pre-op workup,  hypotension Procedure(s)   Complete two-dimensional, color flow and Doppler transthoracic echocardiogram is performed.         Summary   1. The left ventricle is normal in size with normal wall thickness.   2. The left ventricular systolic function is normal, LVEF is visually estimated at > 55%.   3. The right ventricle is mildly to moderately dilated in size, with mildly reduced systolic function.   4. There is severe tricuspid regurgitation.   5. There is at least moderate pulmonary hypertension, peak velocity may underestimate RVSP due to severe TR.   6. The right atrium is moderately to severely dilated in size.         Left Ventricle   The left ventricle is normal in size with normal wall thickness. The left ventricular systolic function is normal, LVEF is visually estimated at > 55%. Left ventricular diastolic function cannot be accurately assessed.     Right Ventricle   Right ventricle is not well visualized. The right ventricle is mildly to moderately dilated in size, with mildly reduced systolic function.         Left Atrium   The left atrium is not well visualized but probably normal in size.     Right Atrium   The right atrium is moderately to severely dilated in size.         Aortic Valve   The aortic valve is trileaflet with normal appearing leaflets with normal excursion. There is no significant aortic regurgitation. There is no evidence of a significant transvalvular gradient.     Mitral Valve   The mitral valve leaflets are normal with normal leaflet mobility. There is no significant mitral valve regurgitation.     Tricuspid Valve   The tricuspid valve leaflets are normal, with normal leaflet mobility. There is severe tricuspid regurgitation. There is at least moderate pulmonary hypertension, peak velocity  may underestimate RVSP due to severe TR. TR maximum velocity: 3.5 m/s.     Pulmonic Valve   The pulmonic valve is normal. There is no significant pulmonic regurgitation. There is no evidence of a significant transvalvular gradient.         Aorta   The aorta is normal in size in the visualized segments.     Inferior Vena Cava   Mechanical ventilation precludes the ability to accurately assess right atrial pressure.     Pericardium/Pleural   There is no pericardial effusion.         Ventricles ---------------------------------------------------------------------- Name                                 Value        Normal ----------------------------------------------------------------------     LV Dimensions 2D/MM ----------------------------------------------------------------------  IVS Diastolic Thickness (2D)                                1.1 cm       0.6-0.9 LVID Diastole (2D)                  3.4 cm       3.8-5.2  LVPW Diastolic Thickness (2D)                                1.1 cm       0.6-0.9 LVID Systole (2D)                   2.2 cm       2.2-3.5 LV Mass Index (2D Cubed)           67 g/m2         43-95  Relative Wall Thickness (2D)                                  0.65        <=0.42     RV Dimensions 2D/MM ----------------------------------------------------------------------  RV Basal Diastolic Dimension                           3.7 cm       2.5-4.1 TAPSE                               2.1 cm         >=1.7     Atria ---------------------------------------------------------------------- Name Value        Normal ----------------------------------------------------------------------     LA Dimensions ---------------------------------------------------------------------- LA Dimension (2D)                   3.3 cm       2.7-3.8 LA Volume Index (4C A-L)        28.70 ml/m2               LA Volume Index (2C A-L)        21.85 ml/m2               LA Volume (BP MOD)                   41 ml  LA Volume Index (BP MOD)        24.46 ml/m2   16.00-34.00     RA Dimensions ---------------------------------------------------------------------- RA Area (4C)                      21.7 cm2        <=18.0 RA Area (4C) Index              12.8 cm2/m2               RA ESV Index (4C MOD)             43 ml/m2         15-27     Aortic Valve ---------------------------------------------------------------------- Name                                 Value        Normal ----------------------------------------------------------------------     AV Doppler ---------------------------------------------------------------------- AV Peak Velocity                   1.5 m/s               AV Peak Gradient                    9 mmHg     Mitral Valve ---------------------------------------------------------------------- Name                                 Value        Normal ----------------------------------------------------------------------     MV Diastolic Function ---------------------------------------------------------------------- MV E Peak Velocity                 94 cm/s     Tricuspid Valve ---------------------------------------------------------------------- Name                                 Value        Normal ----------------------------------------------------------------------     TV Regurgitation Doppler ---------------------------------------------------------------------- TR Peak Velocity                   3.5 m/s     Pulmonic Valve ---------------------------------------------------------------------- Name                                 Value        Normal ----------------------------------------------------------------------     PV Doppler ---------------------------------------------------------------------- PV Peak Velocity                   1.1 m/s     Aorta ---------------------------------------------------------------------- Name                                 Value        Normal ----------------------------------------------------------------------     Ascending Aorta ---------------------------------------------------------------------- Ao Root Diameter (2D)               3.2 cm               Ao Root Diam Index (2D)          1.9 cm/m2     Venous ---------------------------------------------------------------------- Name  Value        Normal ----------------------------------------------------------------------     IVC/SVC ---------------------------------------------------------------------- IVC Diameter (Exp 2D)               1.1 cm         <=2.1         Report Signatures Finalized by Madaline Savage  MD on 03/01/2024 04:29 PM    XR Chest Portable  Result Date: 03/01/2024  EXAM: XR CHEST PORTABLE ACCESSION: [id number] UN REPORT DATE: 03/01/2024 6:12 AM     CLINICAL INDICATION: VENTILATOR/RESPIRATOR DEPENDENCE STATUS      TECHNIQUE: Single View AP Chest Radiograph.     COMPARISON: Chest 02/29/2024     FINDINGS:     Unchanged support devices.     HEART &  MEDIASTINUM: Similar borderline prominent cardiac silhouette. As better demonstrated on CT imaging, large amount of abdominal contents have herniated into the caudal aspect of the thorax.     PULMONARY/PLEURAL SPACE:  Low lung volumes with associated bibasilar atelectasis. Mass effect associated with large hernia as well as small pleural effusions also contributes to atelectasis within the lung bases. Furthermore, some element of consolidation (pneumonia, aspiration, etc.) in the lung bases is possible.                 Minimal change in portable imaging of the chest relative to recent prior imaging. Marland Kitchen

## 2024-03-02 NOTE — Unmapped (Signed)
 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]  SBO (small bowel obstruction) (CMS-HCC) [K56.609]  Right upper quadrant abdominal pain [R10.11]  Nausea and vomiting, unspecified vomiting type [R11.2]  Past Medical History:   has a past medical history of Anxiety, Arthritis, At risk for falls, Breast cyst, Cancer (CMS-HCC), 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 (07/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  Past Surgical History:   has a past surgical history that includes Bladder surgery; Gallbladder surgery; Ileostomy (2012); Abdominal surgery; pr colonoscopy flx dx w/collj spec when pfrmd (N/A, 02/09/2015); pr sigmoidoscopy,biopsy (N/A, 03/11/2015); Chemotherapy (2012); Breast cyst excision; pr reconstr total shoulder implant (Left, 04/08/2018); pr xcapsl ctrc rmvl insj io lens prosth w/o ecp (Left, 06/05/2022); and pr xcapsl ctrc rmvl insj io lens prosth w/o ecp (Right, 06/19/2022).   Previous admit date: 05/10/2022    Primary Insurance- Payor: MEDICARE / Plan: MEDICARE PART A AND PART B / Product Type: *No Product type* /   Secondary Insurance - Secondary Insurance  MEDICAID [place]    Preferred Pharmacy - [id number] - [place], [place] - [address] AT [facility] [address]  [id number] - [place] - [address]  [facility]  [facility]  [facility]    Care Manager assessed the patient by : Medical record review, Discussion with Clinical Care team  Orientation Level: Other (Comment)  Functional level prior to admission: Partially Assisted  Who provides care at home?: Paid caregiver, Family member  Reason for referral: Discharge Planning    Contact/Decision Maker  Extended Emergency Contact Information  Primary Emergency Contact: Dava Najjar [place] Phone: [phone]  Relation: Daughter  Secondary Emergency Contact: Curtis,Betty   [place] Phone: [phone]  Relation: Daughter    Legal Next of Kin / Guardian / POA / Advance Directives     HCDM (patient stated preference) (Active): [name] - Daughter - [phone]    Advance Directive (Medical Treatment)  Does patient have an advance directive covering medical treatment?: Patient does not have advance directive covering medical treatment.  Reason patient does not have an advance directive covering medical treatment:: Patient needs follow-up to complete one.    Health Care Decision Maker [HCDM] (Medical & Mental Health Treatment)  Healthcare Decision Maker: HCDM documented in the HCDM/Contact Info section.  Information offered on HCDM, Medical & Mental Health advance directives:: Patient given information.    Readmission Information  Have you been hospitalized in the last 30 days?: No    Patient Information  Lives with: Family members (Daughter)  Type of Residence: Private residence  Location/Detail: Patient partially assisted at baseline. Lives with her daughter who serves as caregiver. Use HH (PT/OT) + PCS, owns DME ([facility]).    Support Systems/Concerns: Family Members  Responsibilities/Dependents at home?: No  Home Care services in place prior to admission?: Yes  Type of Home Care services in place prior to admission: DME or oxygen, Home OT, Home PT, Home health aide  [facility] : HH (PT+OT)  [facility] : Home PCS   -- Marletta Lor is Case Production designer, theatre/television/film at [employer] in place prior to admission: Other  Equipment Currently Used at Home: walker, rolling  Currently receiving outpatient dialysis?: No    Financial Information  Need for financial assistance?: Yes  Type of financial assistance required: Community resources    Confirmed patient receiving services from [facility] through [employer].Patient already receiving maximum services and it may not be possible to justify additional hours. If patient's PCP believes the patient needs more than the current amount, must submit a detailed order that states patient cannot be left alone. Please note that they do not provide overnight care.     Travel / Gas vouchers also provided to pt through [employer]    Complex Case Management Program  If this patient needs Complex Case Management, send an EPIC Ambulatory referral to Case Management or call [phone].    [facility] Team  If this patient needs medication-related support, send an EPIC Ambulatory referral to [facility] ([id number]) or call [phone].     Social Determinants of Health  Social Drivers of Health     Food Insecurity: No Food Insecurity (01/06/2024)    Hunger Vital Sign     Worried About Running Out of Food in the Last Year: Never true     Ran Out of Food in the Last Year: Never true   Internet Connectivity: No Internet connectivity concern identified (08/12/2023)    Internet Connectivity     Do you have access to internet services: Yes     How do you connect to the internet: Personal Device at home     Is your internet connection strong enough for you to watch video on your device without major problems?: Yes     Do you have enough data to get through the month?: Yes     Does at least one of the devices have a camera that you can use for video chat?: Yes   Housing/Utilities: Low Risk  (01/06/2024)    Housing/Utilities     Within the past 12 months, have you ever stayed: outside, in a car, in a tent, in an overnight shelter, or temporarily in someone else's home (i.e. couch-surfing)?: No     Are you worried about losing your housing?: No     Within the past 12 months, have you been unable to get utilities (heat, electricity) when it was really needed?: No   Tobacco Use: High Risk (02/28/2024)    Patient History     Smoking Tobacco Use: Every Day     Smokeless Tobacco Use: Never     Passive Exposure: Not on file   Transportation Needs: No Transportation Needs (01/06/2024)    PRAPARE - Transportation     Lack of Transportation (Medical): No     Lack of Transportation (Non-Medical): No   Alcohol Use: Not At Risk (08/12/2023)    Alcohol Use     How often do you have a drink containing alcohol?: Never     How many drinks containing alcohol do you have on a typical day when you are drinking?: 1 - 2     How often do you have 5 or more  drinks on one occasion?: Never   Interpersonal Safety: Not on file   Physical Activity: Inactive (08/12/2023)    Exercise Vital Sign     Days of Exercise per Week: 0 days     Minutes of Exercise per Session: 0 min   Intimate Partner Violence: Not At Risk (12/28/2023)    Received from [facility]    Humiliation, Afraid, Rape, and Kick questionnaire     Fear of Current or Ex-Partner: No     Emotionally Abused: No     Physically Abused: No     Sexually Abused: No   Stress: Stress Concern Present (08/12/2023)    [employer] of Occupational Health - Occupational Stress Questionnaire     Feeling of Stress : To some extent   Substance Use: Low Risk  (08/12/2023)    Substance Use     In the past year, how often have you used prescription drugs for non-medical reasons?: Never     In the past year, how often have you used illegal drugs?: Never     In the past year, have you used any substance for non-medical reasons?: No   Social Connections: Moderately Isolated (08/12/2023)    Social Connection and Isolation Panel [NHANES]     Frequency of Communication with Friends and Family: More than three times a week     Frequency of Social Gatherings with Friends and Family: Once a week     Attends Religious Services: More than 4 times per year     Active Member of [employer] or Organizations: No     Attends Banker Meetings: Never     Marital Status: Widowed   Physicist, medical Strain: Low Risk  (01/06/2024)    Overall Financial Resource Strain (CARDIA)     Difficulty of Paying Living Expenses: Not hard at all   Depression: Not at risk (08/04/2020)    PHQ-2     PHQ-2 Score: 1   Health Literacy: Low Risk  (04/06/2021)    Health Literacy     : Never     Complex Discharge Information  Is patient identified as a difficult/complex discharge?: Potential  Reason patient is identified as difficult/complex discharge : Placement/Clinical    Discharge Needs Assessment  Concerns to be Addressed: discharge planning, care coordination/care conferences  Clinical Risk Factors: > 65, New Diagnosis, Multiple Diagnoses (Chronic), Principal Diagnosis: Cancer, Stroke, COPD, Heart Failure, AMI, Pneumonia, Joint Replacment, History of Falls    Barriers to taking medications: No  Prior overnight hospital stay or ED visit in last 90 days: Yes    12/25/2023 - 12/31/2023   [facility]   [address]   [place], [place] [address]   [phone]     Anticipated Changes Related to Illness: other (see comments) (TBD)  Equipment Needed After Discharge: other (see comments) (TBD)    Discharge Facility/Level of Care Needs: TBD, pending clinical course     Readmission  Risk of Unplanned Readmission Score: UNPLANNED READMISSION SCORE: 30.12%  Predictive Model Details          30% (High)  Factor Value    Calculated 03/02/2024 08:02 25% Number of active inpatient medication orders 48    [place] Risk of Unplanned Readmission Model 7% Active antipsychotic inpatient medication order present     7% ECG/EKG order present in last 6 months     7% Latest calcium low (8.3 mg/dL)     6% Latest BUN high (68 mg/dL)  6% Diagnosis of electrolyte disorder present     5% Restraint order present in last 6 months     5% Imaging order present in last 6 months     5% Age 37     5% Latest hemoglobin low (11.1 g/dL)     4% Phosphorous result present     4% Number of ED visits in last six months 1     3% Charlson Comorbidity Index 4     3% Active anticoagulant inpatient medication order present     3% Latest creatinine high (2.42 mg/dL)     3% Current length of stay 4.153 days     1% Future appointment scheduled     1% Active ulcer inpatient medication order present      Readmitted Within the Last 30 Days? (No if blank)   Patient at risk for readmission?: Yes    Discharge Plan  Screen findings are: Discharge planning needs identified or anticipated (Comment).  Patient's plan of dispo remains tentative at this time d/t current clinical status. Will need eval + recs from PT/OT/ST once medically appropriate. CM will continue to follow pt's hospital course and plan to reassess dispo as clinical stability improves  Expected Discharge Date:     Initial Assessment complete?: Yes

## 2024-03-02 NOTE — Unmapped (Addendum)
 Pt has ETT Size 7.0 @ 22 gums     Current settings are: PRVC 330/18/+8/40%     PT has clear/diminished BBS with amount of small amt of thick white inline secretions.       Tx's given as scheduled. Oral care completed. ETT taped, airway remains patent, secure, and no breakdown noted at this time. Emergency equipment at the bedside. No plans to extubate today.      Problem: Mechanical Ventilation Invasive  Goal: Effective Communication  Outcome: Ongoing - Unchanged  Goal: Optimal Device Function  Outcome: Ongoing - Unchanged  Intervention: Optimize Device Care and Function  Recent Flowsheet Documentation  Taken 03/02/2024 1240 by Wyatt Haste, RRT  Airway/Ventilation Management:   airway patency maintained   humidification applied  Taken 03/02/2024 1191 by Wyatt Haste, RRT  Airway/Ventilation Management:   airway patency maintained   humidification applied  Oral Care:   mouth swabbed   oral rinse provided   suction provided   teeth brushed   tongue brushed  Goal: Mechanical Ventilation Liberation  Outcome: Ongoing - Unchanged  Goal: Optimal Nutrition Delivery  Outcome: Ongoing - Unchanged  Goal: Absence of Device-Related Skin and Tissue Injury  Outcome: Ongoing - Unchanged  Goal: Absence of Ventilator-Induced Lung Injury  Outcome: Ongoing - Unchanged  Intervention: Prevent Ventilator-Associated Pneumonia  Recent Flowsheet Documentation  Taken 03/02/2024 1240 by Wyatt Haste, RRT  Head of Bed [facility]) Positioning: HOB at 30 degrees  VAP Prevention Bundle:   HOB elevation maintained   vent circuit breaks minimized  Taken 03/02/2024 0752 by Wyatt Haste, RRT  Head of Bed (HOB) Positioning: HOB at 30 degrees  VAP Prevention Bundle:   HOB elevation maintained   oral care regularly provided   vent circuit breaks minimized  Oral Care:   mouth swabbed   oral rinse provided   suction provided   teeth brushed   tongue brushed     Problem: Artificial Airway  Goal: Effective Communication  Outcome: Ongoing - Unchanged  Goal: Optimal Device Function  Outcome: Ongoing - Unchanged  Intervention: Optimize Device Care and Function  Recent Flowsheet Documentation  Taken 03/02/2024 1240 by Wyatt Haste, RRT  Airway/Ventilation Management:   airway patency maintained   humidification applied  Taken 03/02/2024 4782 by Wyatt Haste, RRT  Airway/Ventilation Management:   airway patency maintained   humidification applied  Oral Care:   mouth swabbed   oral rinse provided   suction provided   teeth brushed   tongue brushed  Goal: Absence of Device-Related Skin or Tissue Injury  Outcome: Ongoing - Unchanged

## 2024-03-02 NOTE — Unmapped (Signed)
 Pt drowsy. Not following commands. Grimacing and withdrawing to pain. ETT in place. NGT to LIS. Abd wound vac in place and sealed tight. Family present in room. Ileal conduit ostomy bag leaking, changed once. Pt bath in CHG wipes.

## 2024-03-03 LAB — BASIC METABOLIC PANEL
ANION GAP: 15 mmol/L — ABNORMAL HIGH (ref 5–14)
ANION GAP: 16 mmol/L — ABNORMAL HIGH (ref 5–14)
ANION GAP: 16 mmol/L — ABNORMAL HIGH (ref 5–14)
ANION GAP: 15 mmol/L — ABNORMAL HIGH (ref 5–14)
ANION GAP: 17 mmol/L — ABNORMAL HIGH (ref 5–14)
ANION GAP: 16 mmol/L — ABNORMAL HIGH (ref 5–14)
ANION GAP: 18 mmol/L — ABNORMAL HIGH (ref 5–14)
BLOOD UREA NITROGEN: 73 mg/dL — ABNORMAL HIGH (ref 9–23)
BLOOD UREA NITROGEN: 75 mg/dL — ABNORMAL HIGH (ref 9–23)
BLOOD UREA NITROGEN: 78 mg/dL — ABNORMAL HIGH (ref 9–23)
BLOOD UREA NITROGEN: 80 mg/dL — ABNORMAL HIGH (ref 9–23)
BLOOD UREA NITROGEN: 80 mg/dL — ABNORMAL HIGH (ref 9–23)
BLOOD UREA NITROGEN: 81 mg/dL — ABNORMAL HIGH (ref 9–23)
BLOOD UREA NITROGEN: 80 mg/dL — ABNORMAL HIGH (ref 9–23)
BUN / CREAT RATIO: 23
BUN / CREAT RATIO: 23
BUN / CREAT RATIO: 25
BUN / CREAT RATIO: 26
BUN / CREAT RATIO: 25
BUN / CREAT RATIO: 25
BUN / CREAT RATIO: 24
CALCIUM: 8.4 mg/dL — ABNORMAL LOW (ref 8.7–10.4)
CALCIUM: 8.3 mg/dL — ABNORMAL LOW (ref 8.7–10.4)
CALCIUM: 8.2 mg/dL — ABNORMAL LOW (ref 8.7–10.4)
CALCIUM: 8.9 mg/dL (ref 8.7–10.4)
CALCIUM: 8.4 mg/dL — ABNORMAL LOW (ref 8.7–10.4)
CALCIUM: 8.5 mg/dL — ABNORMAL LOW (ref 8.7–10.4)
CALCIUM: 8.8 mg/dL (ref 8.7–10.4)
CHLORIDE: 108 mmol/L — ABNORMAL HIGH (ref 98–107)
CHLORIDE: 109 mmol/L — ABNORMAL HIGH (ref 98–107)
CHLORIDE: 111 mmol/L — ABNORMAL HIGH (ref 98–107)
CHLORIDE: 111 mmol/L — ABNORMAL HIGH (ref 98–107)
CHLORIDE: 111 mmol/L — ABNORMAL HIGH (ref 98–107)
CHLORIDE: 111 mmol/L — ABNORMAL HIGH (ref 98–107)
CHLORIDE: 109 mmol/L — ABNORMAL HIGH (ref 98–107)
CO2: 21 mmol/L (ref 20.0–31.0)
CO2: 20 mmol/L (ref 20.0–31.0)
CO2: 21 mmol/L (ref 20.0–31.0)
CO2: 20 mmol/L (ref 20.0–31.0)
CO2: 20 mmol/L (ref 20.0–31.0)
CO2: 20 mmol/L (ref 20.0–31.0)
CO2: 20 mmol/L (ref 20.0–31.0)
CREATININE: 3.19 mg/dL — ABNORMAL HIGH (ref 0.55–1.02)
CREATININE: 3.26 mg/dL — ABNORMAL HIGH (ref 0.55–1.02)
CREATININE: 3.12 mg/dL — ABNORMAL HIGH (ref 0.55–1.02)
CREATININE: 3.1 mg/dL — ABNORMAL HIGH (ref 0.55–1.02)
CREATININE: 3.23 mg/dL — ABNORMAL HIGH (ref 0.55–1.02)
CREATININE: 3.29 mg/dL — ABNORMAL HIGH (ref 0.55–1.02)
CREATININE: 3.27 mg/dL — ABNORMAL HIGH (ref 0.55–1.02)
EGFR CKD-EPI (2021) FEMALE: 14 mL/min/{1.73_m2} — ABNORMAL LOW (ref >=60)
EGFR CKD-EPI (2021) FEMALE: 14 mL/min/{1.73_m2} — ABNORMAL LOW (ref >=60)
EGFR CKD-EPI (2021) FEMALE: 15 mL/min/{1.73_m2} — ABNORMAL LOW (ref >=60)
EGFR CKD-EPI (2021) FEMALE: 15 mL/min/{1.73_m2} — ABNORMAL LOW (ref >=60)
EGFR CKD-EPI (2021) FEMALE: 14 mL/min/{1.73_m2} — ABNORMAL LOW (ref >=60)
EGFR CKD-EPI (2021) FEMALE: 14 mL/min/{1.73_m2} — ABNORMAL LOW (ref >=60)
EGFR CKD-EPI (2021) FEMALE: 14 mL/min/{1.73_m2} — ABNORMAL LOW (ref >=60)
GLUCOSE RANDOM: 194 mg/dL — ABNORMAL HIGH (ref 70–179)
GLUCOSE RANDOM: 218 mg/dL — ABNORMAL HIGH (ref 70–179)
GLUCOSE RANDOM: 220 mg/dL — ABNORMAL HIGH (ref 70–99)
GLUCOSE RANDOM: 232 mg/dL — ABNORMAL HIGH (ref 70–179)
GLUCOSE RANDOM: 213 mg/dL — ABNORMAL HIGH (ref 70–179)
GLUCOSE RANDOM: 211 mg/dL — ABNORMAL HIGH (ref 70–179)
GLUCOSE RANDOM: 220 mg/dL — ABNORMAL HIGH (ref 70–99)
POTASSIUM: 4.5 mmol/L (ref 3.4–4.8)
POTASSIUM: 4.4 mmol/L (ref 3.4–4.8)
POTASSIUM: 4.3 mmol/L (ref 3.4–4.8)
POTASSIUM: 4.3 mmol/L (ref 3.4–4.8)
POTASSIUM: 4.1 mmol/L (ref 3.4–4.8)
POTASSIUM: 4 mmol/L (ref 3.4–4.8)
POTASSIUM: 4.2 mmol/L (ref 3.4–4.8)
SODIUM: 144 mmol/L (ref 135–145)
SODIUM: 145 mmol/L (ref 135–145)
SODIUM: 148 mmol/L — ABNORMAL HIGH (ref 135–145)
SODIUM: 146 mmol/L — ABNORMAL HIGH (ref 135–145)
SODIUM: 148 mmol/L — ABNORMAL HIGH (ref 135–145)
SODIUM: 147 mmol/L — ABNORMAL HIGH (ref 135–145)
SODIUM: 147 mmol/L — ABNORMAL HIGH (ref 135–145)

## 2024-03-03 LAB — CBC
HEMATOCRIT: 31.4 % — ABNORMAL LOW (ref 34.0–44.0)
HEMATOCRIT: 32.2 % — ABNORMAL LOW (ref 34.0–44.0)
HEMATOCRIT: 32.2 % — ABNORMAL LOW (ref 34.0–44.0)
HEMOGLOBIN: 9.9 g/dL — ABNORMAL LOW (ref 11.3–14.9)
HEMOGLOBIN: 10.5 g/dL — ABNORMAL LOW (ref 11.3–14.9)
HEMOGLOBIN: 10.2 g/dL — ABNORMAL LOW (ref 11.3–14.9)
MEAN CORPUSCULAR HEMOGLOBIN CONC: 31.4 g/dL — ABNORMAL LOW (ref 32.0–36.0)
MEAN CORPUSCULAR HEMOGLOBIN CONC: 32.5 g/dL (ref 32.0–36.0)
MEAN CORPUSCULAR HEMOGLOBIN CONC: 31.7 g/dL — ABNORMAL LOW (ref 32.0–36.0)
MEAN CORPUSCULAR HEMOGLOBIN: 27.2 pg (ref 25.9–32.4)
MEAN CORPUSCULAR HEMOGLOBIN: 27.9 pg (ref 25.9–32.4)
MEAN CORPUSCULAR HEMOGLOBIN: 27.4 pg (ref 25.9–32.4)
MEAN CORPUSCULAR VOLUME: 86.8 fL (ref 77.6–95.7)
MEAN CORPUSCULAR VOLUME: 85.9 fL (ref 77.6–95.7)
MEAN CORPUSCULAR VOLUME: 86.5 fL (ref 77.6–95.7)
MEAN PLATELET VOLUME: 8.8 fL (ref 6.8–10.7)
MEAN PLATELET VOLUME: 8.3 fL (ref 6.8–10.7)
MEAN PLATELET VOLUME: 8.8 fL (ref 6.8–10.7)
PLATELET COUNT: 205 10*9/L (ref 150–450)
PLATELET COUNT: 227 10*9/L (ref 150–450)
PLATELET COUNT: 233 10*9/L (ref 150–450)
RED BLOOD CELL COUNT: 3.62 10*12/L — ABNORMAL LOW (ref 3.95–5.13)
RED BLOOD CELL COUNT: 3.75 10*12/L — ABNORMAL LOW (ref 3.95–5.13)
RED BLOOD CELL COUNT: 3.73 10*12/L — ABNORMAL LOW (ref 3.95–5.13)
RED CELL DISTRIBUTION WIDTH: 17.9 % — ABNORMAL HIGH (ref 12.2–15.2)
RED CELL DISTRIBUTION WIDTH: 18.5 % — ABNORMAL HIGH (ref 12.2–15.2)
RED CELL DISTRIBUTION WIDTH: 18.2 % — ABNORMAL HIGH (ref 12.2–15.2)
WBC ADJUSTED: 11.9 10*9/L — ABNORMAL HIGH (ref 3.6–11.2)
WBC ADJUSTED: 11.9 10*9/L — ABNORMAL HIGH (ref 3.6–11.2)
WBC ADJUSTED: 13.9 10*9/L — ABNORMAL HIGH (ref 3.6–11.2)

## 2024-03-03 LAB — BLOOD GAS CRITICAL CARE PANEL, ARTERIAL
BASE EXCESS ARTERIAL: -6.1 — ABNORMAL LOW (ref -2.0–2.0)
BASE EXCESS ARTERIAL: -7.5 — ABNORMAL LOW (ref -2.0–2.0)
BASE EXCESS ARTERIAL: -5.8 — ABNORMAL LOW (ref -2.0–2.0)
CALCIUM IONIZED ARTERIAL (MG/DL): 4.69 mg/dL (ref 4.40–5.40)
CALCIUM IONIZED ARTERIAL (MG/DL): 5.73 mg/dL — ABNORMAL HIGH (ref 4.40–5.40)
CALCIUM IONIZED ARTERIAL (MG/DL): 5.21 mg/dL (ref 4.40–5.40)
FIO2 ARTERIAL: 60
GLUCOSE WHOLE BLOOD: 225 mg/dL — ABNORMAL HIGH (ref 70–179)
GLUCOSE WHOLE BLOOD: 214 mg/dL — ABNORMAL HIGH (ref 70–179)
GLUCOSE WHOLE BLOOD: 213 mg/dL — ABNORMAL HIGH (ref 70–179)
HCO3 ARTERIAL: 19 mmol/L — ABNORMAL LOW (ref 22–27)
HCO3 ARTERIAL: 19 mmol/L — ABNORMAL LOW (ref 22–27)
HCO3 ARTERIAL: 19 mmol/L — ABNORMAL LOW (ref 22–27)
HEMOGLOBIN BLOOD GAS: 9.1 g/dL — ABNORMAL LOW (ref 12.00–16.00)
HEMOGLOBIN BLOOD GAS: 10.1 g/dL — ABNORMAL LOW (ref 12.00–16.00)
HEMOGLOBIN BLOOD GAS: 10 g/dL — ABNORMAL LOW (ref 12.00–16.00)
LACTATE BLOOD ARTERIAL: 1 mmol/L (ref <1.3)
LACTATE BLOOD ARTERIAL: 0.9 mmol/L (ref <1.3)
LACTATE BLOOD ARTERIAL: 1.1 mmol/L (ref <1.3)
O2 SATURATION ARTERIAL: 98.9 % (ref 94.0–100.0)
O2 SATURATION ARTERIAL: 99.7 % (ref 94.0–100.0)
O2 SATURATION ARTERIAL: 98.2 % (ref 94.0–100.0)
PCO2 ARTERIAL: 34.4 mmHg — ABNORMAL LOW (ref 35.0–45.0)
PCO2 ARTERIAL: 46.7 mmHg — ABNORMAL HIGH (ref 35.0–45.0)
PCO2 ARTERIAL: 33.5 mmHg — ABNORMAL LOW (ref 35.0–45.0)
PH ARTERIAL: 7.35 (ref 7.35–7.45)
PH ARTERIAL: 7.23 — ABNORMAL LOW (ref 7.35–7.45)
PH ARTERIAL: 7.36 (ref 7.35–7.45)
PO2 ARTERIAL: 178 mmHg — ABNORMAL HIGH (ref 80.0–110.0)
PO2 ARTERIAL: 191 mmHg — ABNORMAL HIGH (ref 80.0–110.0)
PO2 ARTERIAL: 106 mmHg (ref 80.0–110.0)
POTASSIUM WHOLE BLOOD: 4.1 mmol/L (ref 3.4–4.6)
POTASSIUM WHOLE BLOOD: 4.3 mmol/L (ref 3.4–4.6)
POTASSIUM WHOLE BLOOD: 3.9 mmol/L (ref 3.4–4.6)
SODIUM WHOLE BLOOD: 146 mmol/L — ABNORMAL HIGH (ref 135–145)
SODIUM WHOLE BLOOD: 149 mmol/L — ABNORMAL HIGH (ref 135–145)
SODIUM WHOLE BLOOD: 146 mmol/L — ABNORMAL HIGH (ref 135–145)

## 2024-03-03 LAB — BLOOD GAS, ARTERIAL
BASE EXCESS ARTERIAL: -6.6 — ABNORMAL LOW (ref -2.0–2.0)
HCO3 ARTERIAL: 19 mmol/L — ABNORMAL LOW (ref 22–27)
O2 SATURATION ARTERIAL: 98.7 % (ref 94.0–100.0)
PCO2 ARTERIAL: 38.1 mmHg (ref 35.0–45.0)
PH ARTERIAL: 7.31 — ABNORMAL LOW (ref 7.35–7.45)
PO2 ARTERIAL: 133 mmHg — ABNORMAL HIGH (ref 80.0–110.0)

## 2024-03-03 LAB — TRIGLYCERIDES: TRIGLYCERIDES: 101 mg/dL (ref 0–150)

## 2024-03-03 LAB — HIGH SENSITIVITY TROPONIN I - 4 HOUR SERIAL
HIGH SENSITIVITY TROPONIN - DELTA (2-6H): 9 ng/L — ABNORMAL HIGH (ref <=7)
HIGH-SENSITIVITY TROPONIN I - 6 HOUR: 109 ng/L (ref <=34)

## 2024-03-03 LAB — PHOSPHORUS
PHOSPHORUS: 5.3 mg/dL — ABNORMAL HIGH (ref 2.4–5.1)
PHOSPHORUS: 5.7 mg/dL — ABNORMAL HIGH (ref 2.4–5.1)
PHOSPHORUS: 5.2 mg/dL — ABNORMAL HIGH (ref 2.4–5.1)

## 2024-03-03 LAB — MAGNESIUM
MAGNESIUM: 3 mg/dL — ABNORMAL HIGH (ref 1.6–2.6)
MAGNESIUM: 3 mg/dL — ABNORMAL HIGH (ref 1.6–2.6)
MAGNESIUM: 2.9 mg/dL — ABNORMAL HIGH (ref 1.6–2.6)

## 2024-03-03 LAB — HIGH SENSITIVITY TROPONIN I - 2H/6H SERIAL: HIGH-SENSITIVITY TROPONIN I - 2 HOUR: 100 ng/L (ref <=34)

## 2024-03-03 MED ADMIN — piperacillin-tazobactam (ZOSYN) IVPB (premix) 4.5 g: 4.5 g | INTRAVENOUS | @ 16:00:00 | Stop: 2024-03-04

## 2024-03-03 MED ADMIN — ROCuronium (ZEMURON) injection: INTRAVENOUS | @ 13:00:00 | Stop: 2024-03-03

## 2024-03-03 MED ADMIN — ipratropium (ATROVENT) 0.02 % nebulizer solution 500 mcg: 500 ug | RESPIRATORY_TRACT | @ 02:00:00

## 2024-03-03 MED ADMIN — albumin human 5 %: INTRAVENOUS | @ 03:00:00 | Stop: 2024-03-02

## 2024-03-03 MED ADMIN — pantoprazole (Protonix) injection 40 mg: 40 mg | INTRAVENOUS | @ 16:00:00

## 2024-03-03 MED ADMIN — ipratropium (ATROVENT) 0.02 % nebulizer solution 500 mcg: 500 ug | RESPIRATORY_TRACT | @ 21:00:00

## 2024-03-03 MED ADMIN — sodium chloride 3% (HYPERTONIC) infusion: 50 mL/h | INTRAVENOUS | @ 12:00:00 | Stop: 2024-03-03

## 2024-03-03 MED ADMIN — heparin (porcine) 5,000 unit/mL injection 5,000 Units: 5000 [IU] | SUBCUTANEOUS | @ 03:00:00

## 2024-03-03 MED ADMIN — propofol (DIPRIVAN) infusion 10 mg/mL: INTRAVENOUS | @ 14:00:00 | Stop: 2024-03-03

## 2024-03-03 MED ADMIN — NORepinephrine 8 mg in dextrose 5 % 250 mL (32 mcg/mL) infusion PMB: 0-15 ug/min | INTRAVENOUS | @ 06:00:00

## 2024-03-03 MED ADMIN — acetaminophen (OFIRMEV) 10 mg/mL injection 1,000 mg: 1000 mg | INTRAVENOUS | @ 07:00:00 | Stop: 2024-03-03

## 2024-03-03 MED ADMIN — ipratropium (ATROVENT) 0.02 % nebulizer solution 500 mcg: 500 ug | RESPIRATORY_TRACT | @ 09:00:00

## 2024-03-03 MED ADMIN — propofol (DIPRIVAN) infusion 10 mg/mL: 0-50 ug/kg/min | INTRAVENOUS | @ 16:00:00

## 2024-03-03 MED ADMIN — heparin (porcine) 5,000 unit/mL injection 5,000 Units: 5000 [IU] | SUBCUTANEOUS | @ 11:00:00

## 2024-03-03 MED ADMIN — piperacillin-tazobactam (ZOSYN) IVPB (premix) 4.5 g: 4.5 g | INTRAVENOUS | @ 06:00:00 | Stop: 2024-03-04

## 2024-03-03 MED ADMIN — fentaNYL PF (SUBLIMAZE) (50 mcg/mL) infusion (bag): 0-200 ug/h | INTRAVENOUS | @ 16:00:00 | Stop: 2024-03-13

## 2024-03-03 MED ADMIN — propofol (DIPRIVAN) infusion 10 mg/mL: 0-50 ug/kg/min | INTRAVENOUS | @ 21:00:00

## 2024-03-03 MED ADMIN — NORepinephrine 8 mg in dextrose 5 % 250 mL (32 mcg/mL) infusion PMB: 0-15 ug/min | INTRAVENOUS | @ 21:00:00

## 2024-03-03 MED ADMIN — ROCuronium (ZEMURON) injection: INTRAVENOUS | @ 14:00:00 | Stop: 2024-03-03

## 2024-03-03 MED ADMIN — furosemide (LASIX) injection 40 mg: 40 mg | INTRAVENOUS | @ 22:00:00 | Stop: 2024-03-03

## 2024-03-03 MED ADMIN — methocarbamol (ROBAXIN) 1,000 mg in sodium chloride (NS) 0.9 % 50 mL IVPB: 1000 mg | INTRAVENOUS | @ 19:00:00

## 2024-03-03 MED ADMIN — ceFAZolin (ANCEF) injection: INTRAVENOUS | @ 13:00:00 | Stop: 2024-03-03

## 2024-03-03 MED ADMIN — fentaNYL (PF) (SUBLIMAZE) injection: INTRAVENOUS | @ 13:00:00 | Stop: 2024-03-03

## 2024-03-03 MED ADMIN — calcium chloride 100 mg/mL (10 %) syringe: INTRAVENOUS | @ 13:00:00 | Stop: 2024-03-03

## 2024-03-03 MED ADMIN — amiodarone 360 mg in dextrose 5% 200 mL (1.8 mg/mL) infusion PMB: .5 mg/min | INTRAVENOUS | @ 18:00:00

## 2024-03-03 MED ADMIN — budesonide (PULMICORT) nebulizer solution 0.5 mg: .5 mg | RESPIRATORY_TRACT | @ 02:00:00

## 2024-03-03 MED ADMIN — electrolyte-A (PLASMA-LYT A) infusion: INTRAVENOUS | @ 13:00:00 | Stop: 2024-03-03

## 2024-03-03 MED ADMIN — calcium chloride 100 mg/mL (10 %) syringe: INTRAVENOUS | @ 14:00:00 | Stop: 2024-03-03

## 2024-03-03 MED ADMIN — heparin (porcine) 5,000 unit/mL injection 5,000 Units: 5000 [IU] | SUBCUTANEOUS | @ 19:00:00

## 2024-03-03 MED ADMIN — albumin human 5 % 25 g: 25 g | INTRAVENOUS | @ 03:00:00 | Stop: 2024-03-02

## 2024-03-03 NOTE — Unmapped (Signed)
 Problem: Adult Inpatient Plan of Care  Goal: Plan of Care Review  03/03/2024 0737 by Clayborn Bigness, RN  Outcome: Ongoing - Unchanged  03/03/2024 9811 by Lattie Corns, Ara Kussmaul, RN  Outcome: Ongoing - Unchanged  Goal: Patient-Specific Goal (Individualized)  03/03/2024 0737 by Lattie Corns, Ara Kussmaul, RN  Outcome: Ongoing - Unchanged  03/03/2024 0702 by Lattie Corns, Ara Kussmaul, RN  Outcome: Ongoing - Unchanged  Goal: Absence of Hospital-Acquired Illness or Injury  03/03/2024 0737 by Clayborn Bigness, RN  Outcome: Ongoing - Unchanged  03/03/2024 0702 by Lattie Corns, Ara Kussmaul, RN  Outcome: Ongoing - Unchanged  Intervention: Identify and Manage Fall Risk  Recent Flowsheet Documentation  Taken 03/03/2024 0200 by Clayborn Bigness, RN  Safety Interventions: family at bedside  Taken 03/03/2024 0000 by Clayborn Bigness, RN  Safety Interventions: family at bedside  Taken 03/02/2024 2200 by Clayborn Bigness, RN  Safety Interventions: family at bedside  Taken 03/02/2024 2000 by Clayborn Bigness, RN  Safety Interventions: family at bedside  Intervention: Prevent Skin Injury  Recent Flowsheet Documentation  Taken 03/03/2024 0200 by Clayborn Bigness, RN  Positioning for Skin: Left  Taken 03/03/2024 0000 by Clayborn Bigness, RN  Positioning for Skin: Right  Taken 03/02/2024 2200 by Clayborn Bigness, RN  Positioning for Skin: Left  Taken 03/02/2024 2000 by Clayborn Bigness, RN  Positioning for Skin: Right  Goal: Optimal Comfort and Wellbeing  03/03/2024 0737 by Clayborn Bigness, RN  Outcome: Ongoing - Unchanged  03/03/2024 0702 by Lattie Corns, Ara Kussmaul, RN  Outcome: Ongoing - Unchanged  Goal: Readiness for Transition of Care  03/03/2024 0737 by Clayborn Bigness, RN  Outcome: Ongoing - Unchanged  03/03/2024 9147 by Lattie Corns, Ara Kussmaul, RN  Outcome: Ongoing - Unchanged  Goal: Rounds/Family Conference  03/03/2024 0737 by Lattie Corns, Ara Kussmaul, RN  Outcome: Ongoing - Unchanged  03/03/2024 8295 by Lattie Corns, Ara Kussmaul, RN  Outcome: Ongoing - Unchanged     Problem: Infection  Goal: Absence of Infection Signs and Symptoms  03/03/2024 0737 by Clayborn Bigness, RN  Outcome: Ongoing - Unchanged  03/03/2024 0702 by Lattie Corns, Ara Kussmaul, RN  Outcome: Ongoing - Unchanged     Problem: Fall Injury Risk  Goal: Absence of Fall and Fall-Related Injury  03/03/2024 0737 by Clayborn Bigness, RN  Outcome: Ongoing - Unchanged  03/03/2024 0702 by Lattie Corns, Ara Kussmaul, RN  Outcome: Ongoing - Unchanged  Intervention: Promote Injury-Free Environment  Recent Flowsheet Documentation  Taken 03/03/2024 0200 by Clayborn Bigness, RN  Safety Interventions: family at bedside  Taken 03/03/2024 0000 by Clayborn Bigness, RN  Safety Interventions: family at bedside  Taken 03/02/2024 2200 by Clayborn Bigness, RN  Safety Interventions: family at bedside  Taken 03/02/2024 2000 by Clayborn Bigness, RN  Safety Interventions: family at bedside     Problem: Wound  Goal: Optimal Coping  03/03/2024 0737 by Clayborn Bigness, RN  Outcome: Ongoing - Unchanged  03/03/2024 0702 by Lattie Corns, Ara Kussmaul, RN  Outcome: Ongoing - Unchanged  Goal: Optimal Functional Ability  03/03/2024 0737 by Clayborn Bigness, RN  Outcome: Ongoing - Unchanged  03/03/2024 6213 by Lattie Corns, Ara Kussmaul, RN  Outcome: Ongoing - Unchanged  Goal: Absence of Infection Signs and Symptoms  03/03/2024 0737 by Lattie Corns, Ara Kussmaul,  RN  Outcome: Ongoing - Unchanged  03/03/2024 0702 by Lattie Corns, Ara Kussmaul, RN  Outcome: Ongoing - Unchanged  Goal: Improved Oral Intake  03/03/2024 0737 by Lattie Corns, Ara Kussmaul, RN  Outcome: Ongoing - Unchanged  03/03/2024 0702 by Lattie Corns, Ara Kussmaul, RN  Outcome: Ongoing - Unchanged  Goal: Optimal Pain Control and Function  03/03/2024 0737 by Clayborn Bigness, RN  Outcome: Ongoing - Unchanged  03/03/2024 0702 by Lattie Corns, Ara Kussmaul, RN  Outcome: Ongoing - Unchanged  Goal: Skin Health and Integrity  03/03/2024 0737 by Clayborn Bigness, RN  Outcome: Ongoing - Unchanged  03/03/2024 6440 by Lattie Corns, Ara Kussmaul, RN  Outcome: Ongoing - Unchanged  Intervention: Optimize Skin Protection  Recent Flowsheet Documentation  Taken 03/03/2024 0700 by Clayborn Bigness, RN  Head of Bed [facility]) Positioning: HOB at 30-45 degrees  Taken 03/03/2024 0600 by Lattie Corns, [place] S, RN  Head of Bed [facility]) Positioning: HOB at 30-45 degrees  Taken 03/03/2024 0400 by Lattie Corns, Ara Kussmaul, RN  Head of Bed [facility]) Positioning: HOB at 30-45 degrees  Taken 03/03/2024 0300 by Lattie Corns, Ara Kussmaul, RN  Head of Bed [facility]) Positioning: HOB at 30-45 degrees  Taken 03/03/2024 0200 by Lattie Corns, Ara Kussmaul, RN  Head of Bed [facility]) Positioning: HOB at 30-45 degrees  Taken 03/03/2024 0100 by Lattie Corns, Ara Kussmaul, RN  Head of Bed [facility]) Positioning: HOB at 30-45 degrees  Taken 03/03/2024 0000 by Clayborn Bigness, RN  Head of Bed [facility]) Positioning: HOB at 30-45 degrees  Taken 03/02/2024 2300 by Lattie Corns, Ara Kussmaul, RN  Head of Bed [facility]) Positioning: HOB at 30-45 degrees  Taken 03/02/2024 2200 by Lattie Corns, Ara Kussmaul, RN  Head of Bed [facility]) Positioning: HOB at 30-45 degrees  Taken 03/02/2024 2100 by Lattie Corns, Ara Kussmaul, RN  Head of Bed [facility]) Positioning: HOB at 30-45 degrees  Taken 03/02/2024 2000 by Lattie Corns, Ara Kussmaul, RN  Head of Bed [facility]) Positioning: HOB at 30-45 degrees  Goal: Optimal Wound Healing  03/03/2024 0737 by Lattie Corns, Ara Kussmaul, RN  Outcome: Ongoing - Unchanged  03/03/2024 0702 by Lattie Corns, Ara Kussmaul, RN  Outcome: Ongoing - Unchanged     Problem: Skin Injury Risk Increased  Goal: Skin Health and Integrity  03/03/2024 0737 by Clayborn Bigness, RN  Outcome: Ongoing - Unchanged  03/03/2024 0702 by Lattie Corns, Ara Kussmaul, RN  Outcome: Ongoing - Unchanged  Intervention: Optimize Skin Protection  Recent Flowsheet Documentation  Taken 03/03/2024 0700 by Clayborn Bigness, RN  Head of Bed [facility]) Positioning: HOB at 30-45 degrees  Taken 03/03/2024 0600 by Lattie Corns, [place] S, RN  Head of Bed [facility]) Positioning: HOB at 30-45 degrees  Taken 03/03/2024 0400 by Lattie Corns, Ara Kussmaul, RN  Head of Bed [facility]) Positioning: HOB at 30-45 degrees  Taken 03/03/2024 0300 by Lattie Corns, Ara Kussmaul, RN  Head of Bed [facility]) Positioning: HOB at 30-45 degrees  Taken 03/03/2024 0200 by Lattie Corns, Ara Kussmaul, RN  Head of Bed [facility]) Positioning: HOB at 30-45 degrees  Taken 03/03/2024 0100 by Lattie Corns, Ara Kussmaul, RN  Head of Bed [facility]) Positioning: HOB at 30-45 degrees  Taken 03/03/2024 0000 by Clayborn Bigness, RN  Head of Bed [facility]) Positioning: HOB at 30-45 degrees  Taken 03/02/2024 2300 by Lattie Corns, Ara Kussmaul, RN  Head of Bed [facility]) Positioning: HOB at 30-45 degrees  Taken 03/02/2024 2200  by Lattie Corns, Ara Kussmaul, RN  Head of Bed [facility]) Positioning: HOB at 30-45 degrees  Taken 03/02/2024 2100 by Lattie Corns, Ara Kussmaul, RN  Head of Bed [facility]) Positioning: HOB at 30-45 degrees  Taken 03/02/2024 2000 by Lattie Corns, Ara Kussmaul, RN  Head of Bed [facility]) Positioning: HOB at 30-45 degrees     Problem: Self-Care Deficit  Goal: Improved Ability to Complete Activities of Daily Living  03/03/2024 0737 by Clayborn Bigness, RN  Outcome: Ongoing - Unchanged  03/03/2024 0702 by Lattie Corns, Ara Kussmaul, RN  Outcome: Ongoing - Unchanged     Problem: Mechanical Ventilation Invasive  Goal: Effective Communication  03/03/2024 0737 by Lattie Corns, Ara Kussmaul, RN  Outcome: Ongoing - Unchanged  03/03/2024 0702 by Lattie Corns, Ara Kussmaul, RN  Outcome: Ongoing - Unchanged  Goal: Optimal Device Function  03/03/2024 0737 by Lattie Corns, Ara Kussmaul, RN  Outcome: Ongoing - Unchanged  03/03/2024 0702 by Lattie Corns, Ara Kussmaul, RN  Outcome: Ongoing - Unchanged  Intervention: Optimize Device Care and Function  Recent Flowsheet Documentation  Taken 03/03/2024 0700 by Clayborn Bigness, RN  Oral Care: suction provided  Taken 03/03/2024 0600 by Clayborn Bigness, RN  Oral Care:   tongue brushed   suction provided  Taken 03/03/2024 0400 by Clayborn Bigness, RN  Oral Care:   tongue brushed   suction provided   mouth swabbed  Taken 03/03/2024 0300 by Clayborn Bigness, RN  Oral Care: suction provided  Taken 03/03/2024 0200 by Clayborn Bigness, RN  Oral Care: suction provided  Taken 03/03/2024 0100 by Clayborn Bigness, RN  Oral Care: suction provided  Taken 03/03/2024 0000 by Clayborn Bigness, RN  Oral Care: suction provided  Taken 03/02/2024 2300 by Clayborn Bigness, RN  Oral Care: suction provided  Taken 03/02/2024 2200 by Clayborn Bigness, RN  Oral Care: suction provided  Taken 03/02/2024 2100 by Clayborn Bigness, RN  Oral Care: suction provided  Taken 03/02/2024 2000 by Clayborn Bigness, RN  Airway/Ventilation Management: airway patency maintained  Oral Care: suction provided  Goal: Mechanical Ventilation Liberation  03/03/2024 0737 by Lattie Corns, Ara Kussmaul, RN  Outcome: Ongoing - Unchanged  03/03/2024 0702 by Lattie Corns, Ara Kussmaul, RN  Outcome: Ongoing - Unchanged  Goal: Optimal Nutrition Delivery  03/03/2024 0737 by Lattie Corns, Ara Kussmaul, RN  Outcome: Ongoing - Unchanged  03/03/2024 0702 by Lattie Corns, Ara Kussmaul, RN  Outcome: Ongoing - Unchanged  Goal: Absence of Device-Related Skin and Tissue Injury  03/03/2024 0737 by Clayborn Bigness, RN  Outcome: Ongoing - Unchanged  03/03/2024 0702 by Lattie Corns, Ara Kussmaul, RN  Outcome: Ongoing - Unchanged  Goal: Absence of Ventilator-Induced Lung Injury  03/03/2024 0737 by Clayborn Bigness, RN  Outcome: Ongoing - Unchanged  03/03/2024 0702 by Lattie Corns, Ara Kussmaul, RN  Outcome: Ongoing - Unchanged  Intervention: Prevent Ventilator-Associated Pneumonia  Recent Flowsheet Documentation  Taken 03/03/2024 0700 by Clayborn Bigness, RN  Head of Bed [facility]) Positioning: HOB at 30-45 degrees  Oral Care: suction provided  Taken 03/03/2024 0600 by Lattie Corns, Ara Kussmaul, RN  Head of Bed [facility]) Positioning: HOB at 30-45 degrees  Oral Care:   tongue brushed   suction provided  Taken 03/03/2024 0400 by Lattie Corns, Ara Kussmaul, RN  Head of Bed [facility]) Positioning: HOB at 30-45 degrees  Oral Care:  tongue brushed   suction provided   mouth swabbed  Taken 03/03/2024 0300 by Lattie Corns, Ara Kussmaul, RN  Head of Bed [facility]) Positioning: HOB at 30-45 degrees  Oral Care: suction provided  Taken 03/03/2024 0200 by Lattie Corns, Ara Kussmaul, RN  Head of Bed [facility]) Positioning: HOB at 30-45 degrees  Oral Care: suction provided  Taken 03/03/2024 0100 by Lattie Corns, Ara Kussmaul, RN  Head of Bed [facility]) Positioning: HOB at 30-45 degrees  Oral Care: suction provided  Taken 03/03/2024 0000 by Clayborn Bigness, RN  Head of Bed [facility]) Positioning: HOB at 30-45 degrees  Oral Care: suction provided  Taken 03/02/2024 2300 by Lattie Corns, Ara Kussmaul, RN  Head of Bed [facility]) Positioning: HOB at 30-45 degrees  Oral Care: suction provided  Taken 03/02/2024 2200 by Lattie Corns, Ara Kussmaul, RN  Head of Bed [facility]) Positioning: HOB at 30-45 degrees  Oral Care: suction provided  Taken 03/02/2024 2100 by Lattie Corns, Ara Kussmaul, RN  Head of Bed [facility]) Positioning: HOB at 30-45 degrees  Oral Care: suction provided  Taken 03/02/2024 2000 by Lattie Corns, Ara Kussmaul, RN  Head of Bed [facility]) Positioning: HOB at 30-45 degrees  Oral Care: suction provided     Problem: Artificial Airway  Goal: Effective Communication  03/03/2024 0737 by Clayborn Bigness, RN  Outcome: Ongoing - Unchanged  03/03/2024 0702 by Lattie Corns, Ara Kussmaul, RN  Outcome: Ongoing - Unchanged  Goal: Optimal Device Function  03/03/2024 0737 by Lattie Corns, Ara Kussmaul, RN  Outcome: Ongoing - Unchanged  03/03/2024 0702 by Lattie Corns, Ara Kussmaul, RN  Outcome: Ongoing - Unchanged  Intervention: Optimize Device Care and Function  Recent Flowsheet Documentation  Taken 03/03/2024 0700 by Clayborn Bigness, RN  Oral Care: suction provided  Taken 03/03/2024 0600 by Clayborn Bigness, RN  Oral Care:   tongue brushed   suction provided  Taken 03/03/2024 0400 by Clayborn Bigness, RN  Oral Care:   tongue brushed   suction provided   mouth swabbed  Taken 03/03/2024 0300 by Clayborn Bigness, RN  Oral Care: suction provided  Taken 03/03/2024 0200 by Clayborn Bigness, RN  Oral Care: suction provided  Taken 03/03/2024 0100 by Clayborn Bigness, RN  Oral Care: suction provided  Taken 03/03/2024 0000 by Clayborn Bigness, RN  Oral Care: suction provided  Taken 03/02/2024 2300 by Clayborn Bigness, RN  Oral Care: suction provided  Taken 03/02/2024 2200 by Clayborn Bigness, RN  Oral Care: suction provided  Taken 03/02/2024 2100 by Clayborn Bigness, RN  Oral Care: suction provided  Taken 03/02/2024 2000 by Clayborn Bigness, RN  Airway/Ventilation Management: airway patency maintained  Oral Care: suction provided  Goal: Absence of Device-Related Skin or Tissue Injury  03/03/2024 0737 by Clayborn Bigness, RN  Outcome: Ongoing - Unchanged  03/03/2024 0702 by Lattie Corns, Ara Kussmaul, RN  Outcome: Ongoing - Unchanged     Problem: Adult Inpatient Plan of Care  Goal: Absence of Hospital-Acquired Illness or Injury  03/03/2024 0737 by Clayborn Bigness, RN  Outcome: Ongoing - Unchanged  03/03/2024 0702 by Lattie Corns, Ara Kussmaul, RN  Outcome: Ongoing - Unchanged  Intervention: Identify and Manage Fall Risk  Recent Flowsheet Documentation  Taken 03/03/2024 0200 by Clayborn Bigness, RN  Safety Interventions: family at bedside  Taken 03/03/2024 0000 by Lattie Corns, Ara Kussmaul, RN  Safety Interventions: family at bedside  Taken 03/02/2024 2200 by Lattie Corns, Ara Kussmaul, RN  Safety Interventions: family at bedside  Taken 03/02/2024 2000 by Lattie Corns, Ara Kussmaul, RN  Safety Interventions: family at bedside  Intervention: Prevent Skin Injury  Recent Flowsheet Documentation  Taken 03/03/2024 0200 by Clayborn Bigness, RN  Positioning for Skin: Left  Taken 03/03/2024 0000 by Clayborn Bigness, RN  Positioning for Skin: Right  Taken 03/02/2024 2200 by Clayborn Bigness, RN  Positioning for Skin: Left  Taken 03/02/2024 2000 by Clayborn Bigness, RN  Positioning for Skin: Right     Problem: Adult Inpatient Plan of Care  Goal: Absence of Hospital-Acquired Illness or Injury  Intervention: Identify and Manage Fall Risk  Recent Flowsheet Documentation  Taken 03/03/2024 0200 by Clayborn Bigness, RN  Safety Interventions: family at bedside  Taken 03/03/2024 0000 by Clayborn Bigness, RN  Safety Interventions: family at bedside  Taken 03/02/2024 2200 by Clayborn Bigness, RN  Safety Interventions: family at bedside  Taken 03/02/2024 2000 by Clayborn Bigness, RN  Safety Interventions: family at bedside     Problem: Adult Inpatient Plan of Care  Goal: Optimal Comfort and Wellbeing  03/03/2024 0737 by Clayborn Bigness, RN  Outcome: Ongoing - Unchanged  03/03/2024 0702 by Lattie Corns, Ara Kussmaul, RN  Outcome: Ongoing - Unchanged     Problem: Adult Inpatient Plan of Care  Goal: Readiness for Transition of Care  03/03/2024 0737 by Clayborn Bigness, RN  Outcome: Ongoing - Unchanged  03/03/2024 0702 by Lattie Corns, Ara Kussmaul, RN  Outcome: Ongoing - Unchanged     Problem: Fall Injury Risk  Goal: Absence of Fall and Fall-Related Injury  Intervention: Promote Injury-Free Environment  Recent Flowsheet Documentation  Taken 03/03/2024 0200 by Clayborn Bigness, RN  Safety Interventions: family at bedside  Taken 03/03/2024 0000 by Clayborn Bigness, RN  Safety Interventions: family at bedside  Taken 03/02/2024 2200 by Clayborn Bigness, RN  Safety Interventions: family at bedside  Taken 03/02/2024 2000 by Clayborn Bigness, RN  Safety Interventions: family at bedside

## 2024-03-03 NOTE — Unmapped (Signed)
 Problem: Adult Inpatient Plan of Care  Goal: Plan of Care Review  03/03/2024 0738 by Lattie Corns, Ara Kussmaul, RN  Outcome: Ongoing - Unchanged  03/03/2024 0737 by Lattie Corns, Ara Kussmaul, RN  Outcome: Ongoing - Unchanged  03/03/2024 0702 by Lattie Corns, Ara Kussmaul, RN  Outcome: Ongoing - Unchanged  Goal: Patient-Specific Goal (Individualized)  03/03/2024 0738 by Lattie Corns, Ara Kussmaul, RN  Outcome: Ongoing - Unchanged  03/03/2024 0737 by Lattie Corns, Ara Kussmaul, RN  Outcome: Ongoing - Unchanged  03/03/2024 8657 by Lattie Corns, Ara Kussmaul, RN  Outcome: Ongoing - Unchanged  Goal: Absence of Hospital-Acquired Illness or Injury  03/03/2024 0738 by Clayborn Bigness, RN  Outcome: Ongoing - Unchanged  03/03/2024 0737 by Lattie Corns, Ara Kussmaul, RN  Outcome: Ongoing - Unchanged  03/03/2024 8469 by Lattie Corns, Ara Kussmaul, RN  Outcome: Ongoing - Unchanged  Intervention: Identify and Manage Fall Risk  Recent Flowsheet Documentation  Taken 03/03/2024 0200 by Clayborn Bigness, RN  Safety Interventions: family at bedside  Taken 03/03/2024 0000 by Clayborn Bigness, RN  Safety Interventions: family at bedside  Taken 03/02/2024 2200 by Clayborn Bigness, RN  Safety Interventions: family at bedside  Taken 03/02/2024 2000 by Clayborn Bigness, RN  Safety Interventions: family at bedside  Intervention: Prevent Skin Injury  Recent Flowsheet Documentation  Taken 03/03/2024 0200 by Clayborn Bigness, RN  Positioning for Skin: Left  Taken 03/03/2024 0000 by Clayborn Bigness, RN  Positioning for Skin: Right  Taken 03/02/2024 2200 by Clayborn Bigness, RN  Positioning for Skin: Left  Taken 03/02/2024 2000 by Clayborn Bigness, RN  Positioning for Skin: Right  Goal: Optimal Comfort and Wellbeing  03/03/2024 0738 by Clayborn Bigness, RN  Outcome: Ongoing - Unchanged  03/03/2024 0737 by Lattie Corns, Ara Kussmaul, RN  Outcome: Ongoing - Unchanged  03/03/2024 6295 by Lattie Corns, Ara Kussmaul, RN  Outcome: Ongoing - Unchanged  Goal: Readiness for Transition of Care  03/03/2024 0738 by Clayborn Bigness, RN  Outcome: Ongoing - Unchanged  03/03/2024 0737 by Lattie Corns, Ara Kussmaul, RN  Outcome: Ongoing - Unchanged  03/03/2024 2841 by Lattie Corns, Ara Kussmaul, RN  Outcome: Ongoing - Unchanged  Goal: Rounds/Family Conference  03/03/2024 3244 by Lattie Corns, Ara Kussmaul, RN  Outcome: Ongoing - Unchanged  03/03/2024 0737 by Lattie Corns, Ara Kussmaul, RN  Outcome: Ongoing - Unchanged  03/03/2024 0102 by Lattie Corns, Ara Kussmaul, RN  Outcome: Ongoing - Unchanged     Problem: Infection  Goal: Absence of Infection Signs and Symptoms  03/03/2024 0738 by Clayborn Bigness, RN  Outcome: Ongoing - Unchanged  03/03/2024 0737 by Lattie Corns, Ara Kussmaul, RN  Outcome: Ongoing - Unchanged  03/03/2024 0702 by Lattie Corns, Ara Kussmaul, RN  Outcome: Ongoing - Unchanged     Problem: Fall Injury Risk  Goal: Absence of Fall and Fall-Related Injury  03/03/2024 7253 by Clayborn Bigness, RN  Outcome: Ongoing - Unchanged  03/03/2024 0737 by Lattie Corns, Ara Kussmaul, RN  Outcome: Ongoing - Unchanged  03/03/2024 0702 by Lattie Corns, Ara Kussmaul, RN  Outcome: Ongoing - Unchanged  Intervention: Promote Injury-Free Environment  Recent Flowsheet Documentation  Taken 03/03/2024 0200 by Clayborn Bigness, RN  Safety Interventions: family at bedside  Taken 03/03/2024 0000 by Clayborn Bigness, RN  Safety Interventions: family at bedside  Taken 03/02/2024 2200 by Lattie Corns,  Ara Kussmaul, RN  Safety Interventions: family at bedside  Taken 03/02/2024 2000 by Lattie Corns, Ara Kussmaul, RN  Safety Interventions: family at bedside     Problem: Wound  Goal: Optimal Coping  03/03/2024 0738 by Lattie Corns, Ara Kussmaul, RN  Outcome: Ongoing - Unchanged  03/03/2024 0737 by Lattie Corns, Ara Kussmaul, RN  Outcome: Ongoing - Unchanged  03/03/2024 0702 by Lattie Corns, Ara Kussmaul, RN  Outcome: Ongoing - Unchanged  Goal: Optimal Functional Ability  03/03/2024 0738 by Clayborn Bigness, RN  Outcome: Ongoing - Unchanged  03/03/2024 0737 by Lattie Corns, Ara Kussmaul, RN  Outcome: Ongoing - Unchanged  03/03/2024 1610 by Lattie Corns, Ara Kussmaul, RN  Outcome: Ongoing - Unchanged  Goal: Absence of Infection Signs and Symptoms  03/03/2024 0738 by Clayborn Bigness, RN  Outcome: Ongoing - Unchanged  03/03/2024 0737 by Lattie Corns, Ara Kussmaul, RN  Outcome: Ongoing - Unchanged  03/03/2024 0702 by Lattie Corns, Ara Kussmaul, RN  Outcome: Ongoing - Unchanged  Goal: Improved Oral Intake  03/03/2024 0738 by Lattie Corns, Ara Kussmaul, RN  Outcome: Ongoing - Unchanged  03/03/2024 0737 by Lattie Corns, Ara Kussmaul, RN  Outcome: Ongoing - Unchanged  03/03/2024 9604 by Lattie Corns, Ara Kussmaul, RN  Outcome: Ongoing - Unchanged  Goal: Optimal Pain Control and Function  03/03/2024 0738 by Clayborn Bigness, RN  Outcome: Ongoing - Unchanged  03/03/2024 0737 by Lattie Corns, Ara Kussmaul, RN  Outcome: Ongoing - Unchanged  03/03/2024 5409 by Lattie Corns, Ara Kussmaul, RN  Outcome: Ongoing - Unchanged  Goal: Skin Health and Integrity  03/03/2024 0738 by Clayborn Bigness, RN  Outcome: Ongoing - Unchanged  03/03/2024 0737 by Lattie Corns, Ara Kussmaul, RN  Outcome: Ongoing - Unchanged  03/03/2024 8119 by Lattie Corns, Ara Kussmaul, RN  Outcome: Ongoing - Unchanged  Intervention: Optimize Skin Protection  Recent Flowsheet Documentation  Taken 03/03/2024 0700 by Clayborn Bigness, RN  Head of Bed [facility]) Positioning: HOB at 30-45 degrees  Taken 03/03/2024 0600 by Lattie Corns, [place] S, RN  Head of Bed [facility]) Positioning: HOB at 30-45 degrees  Taken 03/03/2024 0400 by Lattie Corns, Ara Kussmaul, RN  Head of Bed [facility]) Positioning: HOB at 30-45 degrees  Taken 03/03/2024 0300 by Lattie Corns, Ara Kussmaul, RN  Head of Bed [facility]) Positioning: HOB at 30-45 degrees  Taken 03/03/2024 0200 by Lattie Corns, Ara Kussmaul, RN  Head of Bed [facility]) Positioning: HOB at 30-45 degrees  Taken 03/03/2024 0100 by Lattie Corns, Ara Kussmaul, RN  Head of Bed [facility]) Positioning: HOB at 30-45 degrees  Taken 03/03/2024 0000 by Clayborn Bigness, RN  Head of Bed [facility]) Positioning: HOB at 30-45 degrees  Taken 03/02/2024 2300 by Lattie Corns, Libby S, RN  Head of Bed [facility]) Positioning: HOB at 30-45 degrees  Taken 03/02/2024 2200 by Lattie Corns, [place] S, RN  Head of Bed [facility]) Positioning: HOB at 30-45 degrees  Taken 03/02/2024 2100 by Lattie Corns, Ara Kussmaul, RN  Head of Bed [facility]) Positioning: HOB at 30-45 degrees  Taken 03/02/2024 2000 by Lattie Corns, Ara Kussmaul, RN  Head of Bed [facility]) Positioning: HOB at 30-45 degrees  Goal: Optimal Wound Healing  03/03/2024 0738 by Lattie Corns, Ara Kussmaul, RN  Outcome: Ongoing - Unchanged  03/03/2024 0737 by Lattie Corns, Ara Kussmaul, RN  Outcome: Ongoing - Unchanged  03/03/2024 0702 by Lattie Corns, Ara Kussmaul, RN  Outcome: Ongoing - Unchanged     Problem: Skin  Injury Risk Increased  Goal: Skin Health and Integrity  03/03/2024 0738 by Lattie Corns, Ara Kussmaul, RN  Outcome: Ongoing - Unchanged  03/03/2024 0737 by Lattie Corns, Ara Kussmaul, RN  Outcome: Ongoing - Unchanged  03/03/2024 0702 by Lattie Corns, Ara Kussmaul, RN  Outcome: Ongoing - Unchanged  Intervention: Optimize Skin Protection  Recent Flowsheet Documentation  Taken 03/03/2024 0700 by Clayborn Bigness, RN  Head of Bed [facility]) Positioning: HOB at 30-45 degrees  Taken 03/03/2024 0600 by Lattie Corns, Ara Kussmaul, RN  Head of Bed [facility]) Positioning: HOB at 30-45 degrees  Taken 03/03/2024 0400 by Lattie Corns, Ara Kussmaul, RN  Head of Bed [facility]) Positioning: HOB at 30-45 degrees  Taken 03/03/2024 0300 by Lattie Corns, Ara Kussmaul, RN  Head of Bed [facility]) Positioning: HOB at 30-45 degrees  Taken 03/03/2024 0200 by Lattie Corns, Ara Kussmaul, RN  Head of Bed [facility]) Positioning: HOB at 30-45 degrees  Taken 03/03/2024 0100 by Lattie Corns, Ara Kussmaul, RN  Head of Bed [facility]) Positioning: HOB at 30-45 degrees  Taken 03/03/2024 0000 by Clayborn Bigness, RN  Head of Bed [facility] Positioning: HOB at 30-45 degrees  Taken 03/02/2024 2300 by Lattie Corns, Ara Kussmaul, RN  Head of Bed [facility]) Positioning: HOB at 30-45 degrees  Taken 03/02/2024 2200 by Lattie Corns, Ara Kussmaul, RN  Head of Bed [facility]) Positioning: HOB at 30-45 degrees  Taken 03/02/2024 2100 by Lattie Corns, Ara Kussmaul, RN  Head of Bed [facility]) Positioning: HOB at 30-45 degrees  Taken 03/02/2024 2000 by Lattie Corns, Ara Kussmaul, RN  Head of Bed [facility]) Positioning: HOB at 30-45 degrees     Problem: Self-Care Deficit  Goal: Improved Ability to Complete Activities of Daily Living  03/03/2024 0738 by Lattie Corns, Ara Kussmaul, RN  Outcome: Ongoing - Unchanged  03/03/2024 0737 by Lattie Corns, Ara Kussmaul, RN  Outcome: Ongoing - Unchanged  03/03/2024 0702 by Lattie Corns, Ara Kussmaul, RN  Outcome: Ongoing - Unchanged     Problem: Mechanical Ventilation Invasive  Goal: Effective Communication  03/03/2024 0738 by Lattie Corns, Ara Kussmaul, RN  Outcome: Ongoing - Unchanged  03/03/2024 0737 by Lattie Corns, Ara Kussmaul, RN  Outcome: Ongoing - Unchanged  03/03/2024 0702 by Lattie Corns, Ara Kussmaul, RN  Outcome: Ongoing - Unchanged  Goal: Optimal Device Function  03/03/2024 0738 by Clayborn Bigness, RN  Outcome: Ongoing - Unchanged  03/03/2024 0737 by Lattie Corns, Ara Kussmaul, RN  Outcome: Ongoing - Unchanged  03/03/2024 0702 by Lattie Corns, Ara Kussmaul, RN  Outcome: Ongoing - Unchanged  Intervention: Optimize Device Care and Function  Recent Flowsheet Documentation  Taken 03/03/2024 0700 by Clayborn Bigness, RN  Oral Care: suction provided  Taken 03/03/2024 0600 by Clayborn Bigness, RN  Oral Care:   tongue brushed   suction provided  Taken 03/03/2024 0400 by Clayborn Bigness, RN  Oral Care:   tongue brushed   suction provided   mouth swabbed  Taken 03/03/2024 0300 by Clayborn Bigness, RN  Oral Care: suction provided  Taken 03/03/2024 0200 by Clayborn Bigness, RN  Oral Care: suction provided  Taken 03/03/2024 0100 by Clayborn Bigness, RN  Oral Care: suction provided  Taken 03/03/2024 0000 by Clayborn Bigness, RN  Oral Care: suction provided  Taken 03/02/2024 2300 by Clayborn Bigness, RN  Oral Care: suction provided  Taken 03/02/2024 2200 by Clayborn Bigness, RN  Oral Care:  suction provided  Taken 03/02/2024 2100 by Lattie Corns, Ara Kussmaul, RN  Oral Care: suction provided  Taken 03/02/2024 2000 by Clayborn Bigness, RN  Airway/Ventilation Management: airway patency maintained  Oral Care: suction provided  Goal: Mechanical Ventilation Liberation  03/03/2024 0738 by Lattie Corns, Ara Kussmaul, RN  Outcome: Ongoing - Unchanged  03/03/2024 0737 by Lattie Corns, Ara Kussmaul, RN  Outcome: Ongoing - Unchanged  03/03/2024 0702 by Lattie Corns, Ara Kussmaul, RN  Outcome: Ongoing - Unchanged  Goal: Optimal Nutrition Delivery  03/03/2024 0738 by Lattie Corns, Ara Kussmaul, RN  Outcome: Ongoing - Unchanged  03/03/2024 0737 by Lattie Corns, Ara Kussmaul, RN  Outcome: Ongoing - Unchanged  03/03/2024 5784 by Lattie Corns, Ara Kussmaul, RN  Outcome: Ongoing - Unchanged  Goal: Absence of Device-Related Skin and Tissue Injury  03/03/2024 0738 by Clayborn Bigness, RN  Outcome: Ongoing - Unchanged  03/03/2024 0737 by Lattie Corns, Ara Kussmaul, RN  Outcome: Ongoing - Unchanged  03/03/2024 0702 by Lattie Corns, Ara Kussmaul, RN  Outcome: Ongoing - Unchanged  Goal: Absence of Ventilator-Induced Lung Injury  03/03/2024 0738 by Clayborn Bigness, RN  Outcome: Ongoing - Unchanged  03/03/2024 0737 by Lattie Corns, Ara Kussmaul, RN  Outcome: Ongoing - Unchanged  03/03/2024 6962 by Lattie Corns, Ara Kussmaul, RN  Outcome: Ongoing - Unchanged  Intervention: Prevent Ventilator-Associated Pneumonia  Recent Flowsheet Documentation  Taken 03/03/2024 0700 by Clayborn Bigness, RN  Head of Bed [facility]) Positioning: HOB at 30-45 degrees  Oral Care: suction provided  Taken 03/03/2024 0600 by Lattie Corns, Ara Kussmaul, RN  Head of Bed [facility]) Positioning: HOB at 30-45 degrees  Oral Care:   tongue brushed   suction provided  Taken 03/03/2024 0400 by Lattie Corns, Ara Kussmaul, RN  Head of Bed [facility]) Positioning: HOB at 30-45 degrees  Oral Care:   tongue brushed   suction provided   mouth swabbed  Taken 03/03/2024 0300 by Lattie Corns, Ara Kussmaul, RN  Head of Bed [facility]) Positioning: HOB at 30-45 degrees  Oral Care: suction provided  Taken 03/03/2024 0200 by Lattie Corns, Ara Kussmaul, RN  Head of Bed [facility]) Positioning: HOB at 30-45 degrees  Oral Care: suction provided  Taken 03/03/2024 0100 by Lattie Corns, Ara Kussmaul, RN  Head of Bed [facility]) Positioning: HOB at 30-45 degrees  Oral Care: suction provided  Taken 03/03/2024 0000 by Clayborn Bigness, RN  Head of Bed [facility]) Positioning: HOB at 30-45 degrees  Oral Care: suction provided  Taken 03/02/2024 2300 by Lattie Corns, Ara Kussmaul, RN  Head of Bed [facility]) Positioning: HOB at 30-45 degrees  Oral Care: suction provided  Taken 03/02/2024 2200 by Lattie Corns, Ara Kussmaul, RN  Head of Bed [facility]) Positioning: HOB at 30-45 degrees  Oral Care: suction provided  Taken 03/02/2024 2100 by Lattie Corns, Ara Kussmaul, RN  Head of Bed [facility]) Positioning: HOB at 30-45 degrees  Oral Care: suction provided  Taken 03/02/2024 2000 by Lattie Corns, Ara Kussmaul, RN  Head of Bed [facility]) Positioning: HOB at 30-45 degrees  Oral Care: suction provided     Problem: Artificial Airway  Goal: Effective Communication  03/03/2024 0738 by Clayborn Bigness, RN  Outcome: Ongoing - Unchanged  03/03/2024 0737 by Lattie Corns, Ara Kussmaul, RN  Outcome: Ongoing - Unchanged  03/03/2024 0702 by Lattie Corns, Ara Kussmaul, RN  Outcome: Ongoing - Unchanged  Goal: Optimal Device Function  03/03/2024 0738 by Lattie Corns, Ara Kussmaul, RN  Outcome: Ongoing - Unchanged  03/03/2024 0737 by Lattie Corns, Ara Kussmaul, RN  Outcome: Ongoing - Unchanged  03/03/2024 0702 by Lattie Corns, Ara Kussmaul, RN  Outcome: Ongoing - Unchanged  Intervention: Optimize Device Care and Function  Recent Flowsheet Documentation  Taken 03/03/2024 0700 by Clayborn Bigness, RN  Oral Care: suction provided  Taken 03/03/2024 0600 by Clayborn Bigness, RN  Oral Care:   tongue brushed   suction provided  Taken 03/03/2024 0400 by Clayborn Bigness, RN  Oral Care:   tongue brushed   suction provided   mouth swabbed  Taken 03/03/2024 0300 by Clayborn Bigness, RN  Oral Care: suction provided  Taken 03/03/2024 0200 by Clayborn Bigness, RN  Oral Care: suction provided  Taken 03/03/2024 0100 by Clayborn Bigness, RN  Oral Care: suction provided  Taken 03/03/2024 0000 by Clayborn Bigness, RN  Oral Care: suction provided  Taken 03/02/2024 2300 by Clayborn Bigness, RN  Oral Care: suction provided  Taken 03/02/2024 2200 by Clayborn Bigness, RN  Oral Care: suction provided  Taken 03/02/2024 2100 by Clayborn Bigness, RN  Oral Care: suction provided  Taken 03/02/2024 2000 by Clayborn Bigness, RN  Airway/Ventilation Management: airway patency maintained  Oral Care: suction provided  Goal: Absence of Device-Related Skin or Tissue Injury  03/03/2024 2130 by Clayborn Bigness, RN  Outcome: Ongoing - Unchanged  03/03/2024 0737 by Lattie Corns, Ara Kussmaul, RN  Outcome: Ongoing - Unchanged  03/03/2024 0702 by Lattie Corns, Ara Kussmaul, RN  Outcome: Ongoing - Unchanged     Problem: Adult Inpatient Plan of Care  Goal: Patient-Specific Goal (Individualized)  03/03/2024 0738 by Lattie Corns, Ara Kussmaul, RN  Outcome: Ongoing - Unchanged  03/03/2024 0737 by Lattie Corns, Ara Kussmaul, RN  Outcome: Ongoing - Unchanged  03/03/2024 0702 by Lattie Corns, Ara Kussmaul, RN  Outcome: Ongoing - Unchanged     Problem: Adult Inpatient Plan of Care  Goal: Absence of Hospital-Acquired Illness or Injury  03/03/2024 0738 by Clayborn Bigness, RN  Outcome: Ongoing - Unchanged  03/03/2024 0737 by Lattie Corns, Ara Kussmaul, RN  Outcome: Ongoing - Unchanged  03/03/2024 0702 by Lattie Corns, Ara Kussmaul, RN  Outcome: Ongoing - Unchanged  Intervention: Identify and Manage Fall Risk  Recent Flowsheet Documentation  Taken 03/03/2024 0200 by Clayborn Bigness, RN  Safety Interventions: family at bedside  Taken 03/03/2024 0000 by Clayborn Bigness, RN  Safety Interventions: family at bedside  Taken 03/02/2024 2200 by Clayborn Bigness, RN  Safety Interventions: family at bedside  Taken 03/02/2024 2000 by Clayborn Bigness, RN  Safety Interventions: family at bedside  Intervention: Prevent Skin Injury  Recent Flowsheet Documentation  Taken 03/03/2024 0200 by Clayborn Bigness, RN  Positioning for Skin: Left  Taken 03/03/2024 0000 by Clayborn Bigness, RN  Positioning for Skin: Right  Taken 03/02/2024 2200 by Clayborn Bigness, RN  Positioning for Skin: Left  Taken 03/02/2024 2000 by Clayborn Bigness, RN  Positioning for Skin: Right     Problem: Adult Inpatient Plan of Care  Goal: Absence of Hospital-Acquired Illness or Injury  Intervention: Identify and Manage Fall Risk  Recent Flowsheet Documentation  Taken 03/03/2024 0200 by Clayborn Bigness, RN  Safety Interventions: family at bedside  Taken 03/03/2024 0000 by Clayborn Bigness, RN  Safety Interventions:  family at bedside  Taken 03/02/2024 2200 by Lattie Corns, Ara Kussmaul, RN  Safety Interventions: family at bedside  Taken 03/02/2024 2000 by Lattie Corns, Ara Kussmaul, RN  Safety Interventions: family at bedside     Problem: Wound  Goal: Optimal Coping  03/03/2024 0738 by Lattie Corns, Ara Kussmaul, RN  Outcome: Ongoing - Unchanged  03/03/2024 0737 by Lattie Corns, Ara Kussmaul, RN  Outcome: Ongoing - Unchanged  03/03/2024 0702 by Lattie Corns, Ara Kussmaul, RN  Outcome: Ongoing - Unchanged     Problem: Fall Injury Risk  Goal: Absence of Fall and Fall-Related Injury  Intervention: Promote Injury-Free Environment  Recent Flowsheet Documentation  Taken 03/03/2024 0200 by Clayborn Bigness, RN  Safety Interventions: family at bedside  Taken 03/03/2024 0000 by Clayborn Bigness, RN  Safety Interventions: family at bedside  Taken 03/02/2024 2200 by Clayborn Bigness, RN  Safety Interventions: family at bedside  Taken 03/02/2024 2000 by Clayborn Bigness, RN  Safety Interventions: family at bedside

## 2024-03-03 NOTE — Unmapped (Signed)
 Adult Nutrition Assessment Note    Visit Type: MD Consult  Reason for Visit: Parenteral Nutrition    NUTRITION INTERVENTIONS and RECOMMENDATION     2-in-1 PN to start at 2200 without lipids given propofol calories  No K, Mg, Phos in PN based on labs      NUTRITION ASSESSMENT     Patient has received minimal nutrition over the past 6 days due to Open abdomen in discontinuity and appropriate for parenteral nutrition support  Conservative electrolyte provision in initial PN even with plan to start CRRT based on worsening renal function  Further nutrition assessment pending pt/family availability for nutrition history and physical exam    NUTRITIONALLY RELEVANT DATA     HPI & PMH:   83 YOF N/V and abdominal distention 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 On takeback on 3/2, Urology interrogated stoma with no signs of leak. Catheter placed in stoma. Unable to close patient 2/2 persistent small bowel dilation and patient instability.     Nutrition Progress:   3/4: Pt started trickle tube feeds 3/3, Then made NPO for the OR. S/p abd closure but plan to maintain NPO due to concern for bowel/abd swelling and pressure. Now NPO x 6 days    Medications:  Nutritionally pertinent medications reviewed and evaluated for potential food and/or medication interactions.     Labs:   Nutritionally pertinent labs reviewed.     Nutritional Needs:   Daily Estimated Nutrient Needs:  Energy: 1525  1830 kcals 22-25 kcal/kg using estimated dry weight, 61 kg (03/02/24 1026)]  Protein: 75-125 gm [1.5-2.0 gm/kg, 1.2-1.5 gm/kg using estimated dry weight, 61 kg (03/02/24 1026)]  Carbohydrate:   [ ]   Fluid:   mL [ ]     Anthropometric Data:  Height: 154.9 cm (5' 0.98)   Admission weight: 65.1 kg (143 lb 8.3 oz)  Last recorded weight: 68.6 kg (151 lb 3.8 oz)  Date of last recorded weight: 3/3  IBW: 47.68 kg  BMI: Body mass index is 28.59 kg/m??.   Usual Body Weight: est 61kg  Weight Assessment: fluid/bed wt gain    Malnutrition Assessment:  Malnutrition assessment not yet completed at this time due to lack of nutrition history and inability to complete nutrition focused physical exam (NFPE).     Nutrition Focused Physical Exam:  Unable to complete at this time due to pt in the OR    Care plan:  Not completed, unable to diagnose malnutrition at this time    Current Nutrition:  NPO  Parenteral nutrition via Internal Jugular   Non-tunneled   Placement Date: 2/28  Placed by: ICU Team  triple lumen  Tip located in the Cavoatrial junction  Nutrition Orders            Parenteral Nutrition (CENTRAL) at 40 mL/hr starting at 03/04 2200    NPO Sips with meds; Medically necessary: NPO starting at 02/27 0423            Nutritionally Pertinent Allergies, Intolerances, Sensitivities, and/or Cultural/Religious Restrictions:  none identified at this time     GOALS and EVALUATION     Patient to meet 65% or greater of nutritional needs via enteral nutrition within the first week of ICU admission. - not met  Pt to meet at least 80% nutrition needs via parenteral nutrition on average each day NEW    Motivation, Barriers, and  Compliance:  Evaluation of motivation, barriers, and compliance pending at this time due to clinical status.     Discharge Planning:   Monitor for potential discharge needs with multi-disciplinary team.          Follow-Up Parameters:   Monitor daily while on TPN    Arcola Jansky MS, RD, LDN, CNSC

## 2024-03-03 NOTE — Unmapped (Signed)
 Neuro: UTA orientation status. Prop and Fent infusing. No nox stimuli per order.   Resp: ETT. Diminished. Small bilious oral secretions. Scant inline secretions.   Cards: Afib. Amio gtt infusing. Levo titrated to maintain MAP > 65. Pulses palpable. Afebrile. Blood cx collected.   GI: NPO. NGT replaced and to LIWS. No BM.   GU: Urostomy in place; minimal UOP. 40 of lasix given.   Skin: Q2 turns maintained. Abdomen closed; staples and island dressing in place. Heel offloading boots in place.   Access: R TL IJ. R radial A line.     Pt's bed is locked and in lowest position. [name] has been bedside throughout shift and has been updated.     Problem: Adult Inpatient Plan of Care  Goal: Plan of Care Review  Outcome: Ongoing - Unchanged  Goal: Patient-Specific Goal (Individualized)  Outcome: Ongoing - Unchanged  Goal: Absence of Hospital-Acquired Illness or Injury  Outcome: Ongoing - Unchanged  Intervention: Identify and Manage Fall Risk  Recent Flowsheet Documentation  Taken 03/03/2024 1200 by Algis Liming, RN  Safety Interventions:   bed alarm   aspiration precautions   commode/urinal/bedpan at bedside   fall reduction program maintained   lighting adjusted for tasks/safety   low bed   nonskid shoes/slippers when out of bed   room near unit station   family at bedside  Intervention: Prevent Skin Injury  Recent Flowsheet Documentation  Taken 03/03/2024 1800 by Algis Liming, RN  Positioning for Skin: Left  Device Skin Pressure Protection:   absorbent pad utilized/changed   positioning supports utilized   pressure points protected   tubing/devices free from skin contact  Skin Protection:   incontinence pads utilized   tubing/devices free from skin contact   silicone foam dressing in place  Taken 03/03/2024 1600 by Algis Liming, RN  Positioning for Skin: Right  Device Skin Pressure Protection:   absorbent pad utilized/changed   pressure points protected   tubing/devices free from skin contact   positioning supports utilized  Skin Protection:   incontinence pads utilized   silicone foam dressing in place   tubing/devices free from skin contact  Taken 03/03/2024 1400 by Algis Liming, RN  Positioning for Skin: Left  Device Skin Pressure Protection:   absorbent pad utilized/changed   positioning supports utilized   pressure points protected   tubing/devices free from skin contact  Skin Protection:   incontinence pads utilized   silicone foam dressing in place   tubing/devices free from skin contact  Taken 03/03/2024 1200 by Algis Liming, RN  Positioning for Skin: Right  Device Skin Pressure Protection:   absorbent pad utilized/changed   positioning supports utilized   pressure points protected   tubing/devices free from skin contact  Skin Protection:   incontinence pads utilized   silicone foam dressing in place   tubing/devices free from skin contact  Intervention: Prevent and Manage VTE (Venous Thromboembolism) Risk  Recent Flowsheet Documentation  Taken 03/03/2024 1800 by Algis Liming, RN  Anti-Embolism Device Type: SCD, Knee  Anti-Embolism Device Status: On  Anti-Embolism Device Location: BLE  Taken 03/03/2024 1600 by Algis Liming, RN  Anti-Embolism Device Type: SCD, Knee  Anti-Embolism Device Status: On  Anti-Embolism Device Location: BLE  Taken 03/03/2024 1400 by Algis Liming, RN  Anti-Embolism Device Type: SCD, Knee  Anti-Embolism Device Status: On  Anti-Embolism Device Location: BLE  Taken 03/03/2024 1200 by Algis Liming, RN  Anti-Embolism Device Type: SCD, Knee  Anti-Embolism Device Status: On  Anti-Embolism Device Location: BLE  Intervention: Prevent Infection  Recent Flowsheet Documentation  Taken 03/03/2024 1200 by Algis Liming, RN  Infection Prevention:   environmental surveillance performed   hand hygiene promoted   rest/sleep promoted  Goal: Optimal Comfort and Wellbeing  Outcome: Ongoing - Unchanged  Goal: Readiness for Transition of Care  Outcome: Ongoing - Unchanged  Goal: Rounds/Family Conference  Outcome: Ongoing - Unchanged

## 2024-03-03 NOTE — Unmapped (Addendum)
 ST[facility]U ARTERIAL LINE NOTE     DATE OF SERVICE: 03/03/2024     PROCEDURE:  ARTERIAL LINE      INDICATION:  Hemodynamic monitoring, frequent lab draws     CONSENT:   Yes    DIAGNOSIS:  Hypotension    TIME OUT:   Yes, correct patient, side, site, procedure, position, equipment    POSITION:                 Supine      SITE:                          Right radial    ULTRASOUND:        Yes               ANESTHESIA:  1% buffered lidocaine    DESCRIPTION:          The patient was positioned appropriately. Allen's test performed to verify dual arterial blood supply. Operator masked and gloved in sterile fashion. Chlorhexidine used to sterilize the skin. Sterile drapes were applied to site. The artery was located with ultrasound and lidocaine was used to anesthetize the surrounding skin area. An arterial catheter was placed into the artery with pulsatile arterial blood return. Arterial waveform was transduced. The catheter was secured and a transparent CHG dressing was applied. Perfusion to the extremity was verified with adequate capillary refill.     COMPLICATIONS:  No complications were noted    BLOOD LOSS:  Minimal    POST-PROCEDURE DIAGNOSIS:  Same    ATTESTATION:  I performed the procedure     SIGNATURE:             Tamsen Snider, PA

## 2024-03-03 NOTE — Unmapped (Signed)
 ST[facility]U PROGRESS NOTE     Date of Service: 03/03/2024    Hospital Day: LOS: 5 days        Surgery Date: TBD   Surgical Attending: Merri Ray, MD    Critical Care Attending: Thalia Party, MD    Interval History:   500cc albumin given for hypotension with suboptimal response, MAPs remaining in 50s. NE started with cap of 5, as opposed to more fluid, to reduce risk of bowel edema given plan to close 3/4. NE requirement increased to 8 to maintain MAP>65, and A-line placed.    History of Present Illness:   Tammy Braun is a 83 y.o. female with PMH bladder cancer with cystectomy and ileal conduit, COPD, recent dx of afib, moderate-severe TR, CAD (high calcification score), T2DM presented to [facility] with projectile vomiting and abdominal pain, found to have SBO with transition point at level of ileostomy on CT A/P. On initial presentation, labs remarkable for leukocytosis thought to be iso hemoconcentration, AKI on CKD, mildly elevated venous lactate improved with resuscitation. Admitted to [facility] for worsening hypoxia on HFNC requiring intubation in setting of hiatal hernia. OR on 02/28/24 for ex-lap, reduciton of parastomal hernia, small bowel resection with primary anastomosis, lysis of adhesions, abdomen left open with ABThera in place.     Hospital Course:  - 02/26/24: Admitted to [facility], floor status, consult to urology for possible ileal conduit revision.   - 02/27/24: Patient level of care escalated to step-down iso increased oxygen needs while in ED.  - 02/28/24: Upgraded to ICU. Intubated at bed-side. OR for ex-lap, reduction of parastomal hernia, small bowel resection with primary anastomosis, lysis of adhesions, abdomen left open with ABThera in place.      ASSESSMENT & PLAN:     Neurologic:  - Pain: Sch IV Tylenol, IV Robaxin (holiday currently), HM IV PRN 0.5-1mg   - Fent gtt and pushes  - Sedation: Propofol gtt     Cardiovascular:  #HTN, HLD:   - Chronic. Hemodynamically stable without vasoactive agent.   - MAP goal > 65  - Holding home ASA and Lipitor    #Afib/flutter w RVR:   - Prior hx of paroxysmal afib. CHADSvasc = 6, not anticoagulated PTA per chart review.   - IV amio (bolus + gtt) if HR > 150 after bolus  - Holding home diltiazem, question efficacy given likely poor absorption  - Echo 3/1: LVEF > 55%. Severe TR.  - Pro-BNP: Pending     Respiratory:  #Acute on chronic hypoxemia  #Hx of COPD:   - 2/28: Intubated  - Continue pulmonary toilet  - Home incruse ellipta   - PRN albuterol   - Daily SBT  - Daily ABG    FEN/GI:  F: Medlocked, bolus PRN  E: Replete electrolytes PRN  N: NPO   - Begin Tfs via NGT  - GI prophylaxis: zofran prn for nausea; IV Protonix   - Bowel regimen: held iso bowel obstruction     #S/p ex-lap, reduction of parastomal hernia, small bowel resection with primary anastomosis, lysis of adhesions, abdomen left open with ABThera in place 2/28    #Large hiatal hernia, chronic  - Non-operative, chronic    Renal/Genitourinary:  #AKI on CKD:   - Cr 2.4 < 2.2 (Baseline 1.4)  - FeNa 2/27 1.9% (Indeterminate)    #Hydronephrosis:  - Caution with perioperative fluid resuscitation per Cards   - Consideration for c/s to nephrology iso hyperK, CKD   - If persistent AKI  after closure consider renal ultrasound to assess hydro/obstruction    #H/o bladder cancer s/p radical cystectomy with ileal conduit (2012):  #Parastomal hernia:  - SRU consulted, following   - Strict I&Os  - Foley in conduit, low UOP  - Tentative plan for OR tomorrow for urostomy revision/abdominal closure    Endocrine:   - Glycemic Control: SSI    Hematologic:   - Monitor CBC daily, transfuse products as indicated    Immunologic/Infectious Disease:  - Afebrile, leukocytosis 13.1 < 5.8 < 14.1, continue to monitor  - Cultures:   - Ucx (2/26): Mixed GP/GN organisms    - RPP (2/27): negative   - Abx:    - Zosyn x4d (3/1 - 3/5)    Musculoskeletal:   NAI    Daily Care Checklist:   - Stress Ulcer Prevention: Yes: Mechanically ventilated  - DVT Prophylaxis: Chemical: SQ Heparin  - Daily Awakening: Yes  - Spontaneous Breathing Trial:  post-op  - Indication for Central/PICC Line: Yes  Infusions requiring central access, Hemodynamic monitoring, and Inadequate peripheral access, RIJ triple lumen  - Indication for Urinary Catheter: Yes  Strict intake and output, Agressive diuresis/hydration, and Urinary retention/obstruction  - Diagnostic images/reports of past 24hrs reviewed: Yes    Disposition:   - Continue ICU care  - PT/OT consulted: Yes    Tamsen Snider, PA   Surgical Intensive Care Unit  [place] [facility] [place] [facility]       SUBJECTIVE:      Intubated/sedated     OBJECTIVE:     Physical Exam:  Constitutional: Lying supine, NAD  Neurologic: Sedated, +cough/gag/corneal reflexes  Respiratory: Mechanically ventilated via ETT   Cardiovascular: Tachycardic, irregular rhythm, no peripheral edema to LE   Gastrointestinal: Mildly distended abdomen, ABThera in place draining SS fluid. Urostomy with Foley in stoma draining clear yellow urine.   Musculoskeletal: Trace to no BLE edema.  Skin: Warm and dry     Temp:  [37.3 ??C (99.1 ??F)-38.8 ??C (101.8 ??F)] 37.3 ??C (99.1 ??F)  Heart Rate:  [117-142] 125  SpO2 Pulse:  [114-139] 118  Resp:  [15-25] 18  BP: (74-129)/(48-81) 84/54  MAP (mmHg):  [53-91] 63  A BP-1: (92-142)/(45-71) 99/54  MAP:  [67 mmHg-94 mmHg] 69 mmHg  FiO2 (%):  [40 %-50 %] 40 %  SpO2:  [95 %-100 %] 100 %       Recent Laboratory Results:  Recent Labs     Units 03/02/24  1022   PHART  7.32*   PCO2ART mm Hg 39.1   PO2ART mm Hg 154.0*   HCO3ART mmol/L 20*   BEART  -5.5*   O2SATART % 99.0     Recent Labs     Units 03/02/24  2021 03/03/24  0039 03/03/24  0418   NA mmol/L 143 144 145   K mmol/L 4.9* 4.5 4.4   CL mmol/L 105 108* 109*   CO2 mmol/L 21.0 21.0 20.0   BUN mg/dL 78* 73* 75*   CREATININE mg/dL 1.61* 0.96* 0.45*   GLU mg/dL 409* 811* 914*     Lab Results   Component Value Date    BILITOT 0.5 02/26/2024    BILITOT 0.3 01/06/2024    ALT 12 03/01/2024    ALT 20 02/26/2024    AST 12 03/01/2024    AST 31 02/26/2024    GGT 16 09/17/2011    GGT 354 (H) 05/03/2011    ALKPHOS 101 02/26/2024    ALKPHOS 90 01/06/2024  PROT 9.2 (H) 02/26/2024    PROT 7.5 01/06/2024    ALBUMIN 4.5 02/26/2024    ALBUMIN 3.2 (L) 01/06/2024     Recent Labs     Units 03/02/24  1246 03/02/24  1721 03/02/24  2034 03/03/24  0159   POCGLU mg/dL 161 096 045 409     Recent Labs     Units 03/02/24  0402 03/02/24  1600 03/03/24  0418   WBC 10*9/L 10.9 12.8* 11.9*   RBC 10*12/L 3.96 4.12 3.62*   HGB g/dL 81.1* 91.4 9.9*   HCT % 34.3 35.6 31.4*   MCV fL 86.5 86.4 86.8   MCH pg 28.1 27.4 27.2   MCHC g/dL 78.2 95.6* 21.3*   RDW % 18.1* 18.0* 17.9*   PLT 10*9/L 208 237 205   MPV fL 8.5 9.0 8.8     No results for input(s): INR, APTT in the last 24 hours.    Invalid input(s): PTPATIENT   Lines & Tubes:   Patient Lines/Drains/Airways Status       Active Peripheral & Central Intravenous Access       Name Placement date Placement time Site Days    Peripheral IV 02/26/24 Anterior;Right;Upper Arm 02/26/24  1916  Arm  5    CVC Triple Lumen 02/28/24 Non-tunneled Right Internal jugular 02/28/24  1641  Internal jugular  3                     Patient Lines/Drains/Airways Status       Active Wounds       Name Placement date Placement time Site Days    Negative Pressure Wound Therapy Abdomen Mid 02/28/24  2115  Abdomen  3    Surgical Site 04/08/18 Shoulder Left 04/08/18  1338  -- 2155    Surgical Site 06/05/22 Eye Left 06/05/22  0928  -- 636    Surgical Site 06/19/22 Eye Right 06/19/22  1024  -- 622    Surgical Site 02/28/24 Abdomen 02/28/24  2055  -- 3    Surgical Site 03/01/24 Abdomen 03/01/24  1902  -- 1    Wound 06/24/20 Other (comment) Buttocks inner right buttock 06/24/20  1010  Buttocks  1347                     Respiratory/ventilator settings for last 24 hours:   Vent Mode: PRVC  FiO2 (%): 40 %  S RR: 18  S VT: 330 mL  PEEP: 8 cm H20  PR SUP: 5 cm H20    Intake/Output last 3 shifts:  I/O last 3 completed shifts:  In: 2313.5 [I.V.:1507.2; NG/GT:120; IV Piggyback:686.3]  Out: 1860 [Urine:440]    Daily/Recent Weight:  68.6 kg (151 lb 3.8 oz)    BMI:  Body mass index is 28.59 kg/m??.                                  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    Chronic kidney disease     Depression, psychotic (CMS-HCC)     Diabetes mellitus (CMS-HCC)     in past    Emphysema of lung (  CMS-HCC)     Financial difficulties     Frail elderly     Hearing impairment     Hernia     History of transfusion     Hyperlipidemia     Hypertension     Impaired mobility     Osteoporosis     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/08/2015    Visual impairment      Past Surgical History:   Procedure Laterality Date    ABDOMINAL SURGERY      BLADDER SURGERY      BREAST CYST EXCISION      CHEMOTHERAPY  2012    bladder    GALLBLADDER SURGERY      stone removal    ILEOSTOMY  2012    PR COLONOSCOPY FLX DX W/COLLJ SPEC WHEN PFRMD N/A 02/09/2015    Procedure: COLONOSCOPY, FLEXIBLE, PROXIMAL TO SPLENIC FLEXURE; DIAGNOSTIC, W/WO COLLECTION SPECIMEN BY BRUSH OR WASH;  Surgeon: Dewaine Conger, MD;  Location: [facility] ;  Service: Gastroenterology    PR EXPLORATORY OF ABDOMEN N/A 02/28/2024    Procedure: EXPLORATORY LAPAROTOMY, EXPLORATORY CELIOTOMY WITH OR WITHOUT BIOPSY(S);  Surgeon: Renda Rolls, MD;  [facility];  Service: General Surgery    PR RECONSTR TOTAL SHOULDER IMPLANT Left 04/08/2018    Procedure: ARTHROPLASTY, GLENOHUMERAL JOINT; TOTAL SHOULDER(GLENOID & PROXIMAL HUMERAL REPLACEMENT(EG, TOTAL SHOULDER);  Surgeon: Tomasa Rand, MD;  Location: [facility];  Service: Ortho Sports Medicine    PR SIGMOIDOSCOPY,BIOPSY N/A 03/11/2015    Procedure: SIGMOIDOSCOPY, FLEXIBLE; WITH BIOPSY, SINGLE OR MULTIPLE;  Surgeon: Wilburt Finlay, MD;  [facility];  Service: Gastroenterology    PR XCAPSL CTRC RMVL INSJ IO LENS PROSTH W/O ECP Left 06/05/2022    Procedure: EXTRACAPSULAR CATARACT REMOVAL W/INSERTION OF INTRAOCULAR LENS PROSTHESIS, MANUAL OR MECHANICAL TECHNIQUE WITHOUT ENDOSCOPIC CYCLOPHOTOCOAGULATION;  Surgeon: Garner Gavel, MD;  [facility]: [facility] OR [facility];  Service: Ophthalmology    PR XCAPSL CTRC RMVL INSJ IO LENS PROSTH W/O ECP Right 06/19/2022    Procedure: EXTRACAPSULAR CATARACT REMOVAL W/INSERTION OF INTRAOCULAR LENS PROSTHESIS, MANUAL OR MECHANICAL TECHNIQUE WITHOUT ENDOSCOPIC CYCLOPHOTOCOAGULATION;  Surgeon: Garner Gavel, MD;  [facility]: [facility];  Service: Ophthalmology     Scheduled Medications:   [Provider Hold] aspirin  81 mg Oral Daily    atorvastatin  40 mg Oral Daily    budesonide (PULMICORT) nebulizer solution  0.5 mg Nebulization BID (RT)    [Provider Hold] dilTIAZem  120 mg Oral Daily    flu vacc [id number](72yr up)-PF  0.5 mL Intramuscular During hospitalization    [START ON 03/14/2024] fluPHENAZine decanoate  50 mg Intramuscular Q30 Days    [Provider Hold] furosemide  20 mg Oral Daily    [Provider Hold] gabapentin  100 mg Oral TID    heparin (porcine) for subcutaneous use  5,000 Units Subcutaneous Q8H SCH    insulin lispro  0-20 Units Subcutaneous Q4H SCH    ipratropium  500 mcg Nebulization Q6H (RT)    methocarbamol (ROBAXIN) 1,000 mg in sodium chloride (NS) 0.9 % 50 mL IVPB  1,000 mg Intravenous Q8H    pantoprazole (Protonix) intravenous solution  40 mg Intravenous Daily    piperacillin-tazobactam  4.5 g Intravenous Q12H     Continuous Infusions:   amiodarone 0.5 mg/min (03/03/24 0400)    fentaNYL citrate (PF) 50 mcg/mL infusion 25 mcg/hr (03/03/24 0400)    NORepinephrine bitartrate-NS 8 mcg/min (03/03/24 0400)    propofol 10  mg/mL infusion Stopped (02/29/24 0718)    sodium chloride 3% 50 mL/hr (03/03/24 0648)     PRN Medications:  albuterol, [Provider Hold] cyclobenzaprine, dextrose in water, fentaNYL (PF) **OR** fentaNYL (PF), glucagon, HYDROmorphone **OR** HYDROmorphone, HYDROmorphone, nicotine polacrilex **OR** nicotine polacrilex, ondansetron

## 2024-03-03 NOTE — Unmapped (Signed)
 Problem: Adult Inpatient Plan of Care  Goal: Plan of Care Review  Outcome: Ongoing - Unchanged  Goal: Patient-Specific Goal (Individualized)  Outcome: Ongoing - Unchanged  Goal: Absence of Hospital-Acquired Illness or Injury  Outcome: Ongoing - Unchanged  Intervention: Identify and Manage Fall Risk  Recent Flowsheet Documentation  Taken 03/03/2024 0200 by Clayborn Bigness, RN  Safety Interventions: family at bedside  Taken 03/03/2024 0000 by Clayborn Bigness, RN  Safety Interventions: family at bedside  Taken 03/02/2024 2200 by Clayborn Bigness, RN  Safety Interventions: family at bedside  Taken 03/02/2024 2000 by Clayborn Bigness, RN  Safety Interventions: family at bedside  Intervention: Prevent Skin Injury  Recent Flowsheet Documentation  Taken 03/03/2024 0200 by Clayborn Bigness, RN  Positioning for Skin: Left  Taken 03/03/2024 0000 by Clayborn Bigness, RN  Positioning for Skin: Right  Taken 03/02/2024 2200 by Christene Slates S, RN  Positioning for Skin: Left  Taken 03/02/2024 2000 by Clayborn Bigness, RN  Positioning for Skin: Right  Goal: Optimal Comfort and Wellbeing  Outcome: Ongoing - Unchanged  Goal: Readiness for Transition of Care  Outcome: Ongoing - Unchanged  Goal: Rounds/Family Conference  Outcome: Ongoing - Unchanged     Problem: Infection  Goal: Absence of Infection Signs and Symptoms  Outcome: Ongoing - Unchanged     Problem: Fall Injury Risk  Goal: Absence of Fall and Fall-Related Injury  Outcome: Ongoing - Unchanged  Intervention: Promote Injury-Free Environment  Recent Flowsheet Documentation  Taken 03/03/2024 0200 by Clayborn Bigness, RN  Safety Interventions: family at bedside  Taken 03/03/2024 0000 by Clayborn Bigness, RN  Safety Interventions: family at bedside  Taken 03/02/2024 2200 by Clayborn Bigness, RN  Safety Interventions: family at bedside  Taken 03/02/2024 2000 by Clayborn Bigness, RN  Safety Interventions: family at bedside     Problem: Wound  Goal: Optimal Coping  Outcome: Ongoing - Unchanged  Goal: Optimal Functional Ability  Outcome: Ongoing - Unchanged  Goal: Absence of Infection Signs and Symptoms  Outcome: Ongoing - Unchanged  Goal: Improved Oral Intake  Outcome: Ongoing - Unchanged  Goal: Optimal Pain Control and Function  Outcome: Ongoing - Unchanged  Goal: Skin Health and Integrity  Outcome: Ongoing - Unchanged  Intervention: Optimize Skin Protection  Recent Flowsheet Documentation  Taken 03/03/2024 0400 by Lattie Corns, Ara Kussmaul, RN  Head of Bed [facility]) Positioning: HOB at 30-45 degrees  Taken 03/03/2024 0300 by Lattie Corns, Ara Kussmaul, RN  Head of Bed [facility]) Positioning: HOB at 30-45 degrees  Taken 03/03/2024 0200 by Lattie Corns, [place] S, RN  Head of Bed [facility]) Positioning: HOB at 30-45 degrees  Taken 03/03/2024 0100 by Lattie Corns, Ara Kussmaul, RN  Head of Bed [facility]) Positioning: HOB at 30-45 degrees  Taken 03/03/2024 0000 by Clayborn Bigness, RN  Head of Bed [facility]) Positioning: HOB at 30-45 degrees  Taken 03/02/2024 2300 by Lattie Corns, [place] S, RN  Head of Bed [facility]) Positioning: HOB at 30-45 degrees  Taken 03/02/2024 2200 by Lattie Corns, [place] S, RN  Head of Bed [facility]) Positioning: HOB at 30-45 degrees  Taken 03/02/2024 2100 by Lattie Corns, Ara Kussmaul, RN  Head of Bed [facility]) Positioning: HOB at 30-45 degrees  Taken 03/02/2024 2000 by Lattie Corns, [place] S, RN  Head of Bed [facility]) Positioning: HOB at 30-45 degrees  Goal: Optimal Wound Healing  Outcome: Ongoing - Unchanged  Problem: Skin Injury Risk Increased  Goal: Skin Health and Integrity  Outcome: Ongoing - Unchanged  Intervention: Optimize Skin Protection  Recent Flowsheet Documentation  Taken 03/03/2024 0400 by Lattie Corns, Ara Kussmaul, RN  Head of Bed [facility]) Positioning: HOB at 30-45 degrees  Taken 03/03/2024 0300 by Lattie Corns, Ara Kussmaul, RN  Head of Bed [facility]) Positioning: HOB at 30-45 degrees  Taken 03/03/2024 0200 by Lattie Corns, Ara Kussmaul, RN  Head of Bed [facility]) Positioning: HOB at 30-45 degrees  Taken 03/03/2024 0100 by Lattie Corns, Ara Kussmaul, RN  Head of Bed [facility]) Positioning: HOB at 30-45 degrees  Taken 03/03/2024 0000 by Clayborn Bigness, RN  Head of Bed [facility]) Positioning: HOB at 30-45 degrees  Taken 03/02/2024 2300 by Lattie Corns, Ara Kussmaul, RN  Head of Bed [facility]) Positioning: HOB at 30-45 degrees  Taken 03/02/2024 2200 by Lattie Corns, Ara Kussmaul, RN  Head of Bed [facility]) Positioning: HOB at 30-45 degrees  Taken 03/02/2024 2100 by Lattie Corns, Ara Kussmaul, RN  Head of Bed [facility]) Positioning: HOB at 30-45 degrees  Taken 03/02/2024 2000 by Lattie Corns, Ara Kussmaul, RN  Head of Bed [facility]) Positioning: HOB at 30-45 degrees     Problem: Self-Care Deficit  Goal: Improved Ability to Complete Activities of Daily Living  Outcome: Ongoing - Unchanged     Problem: Mechanical Ventilation Invasive  Goal: Effective Communication  Outcome: Ongoing - Unchanged  Goal: Optimal Device Function  Outcome: Ongoing - Unchanged  Intervention: Optimize Device Care and Function  Recent Flowsheet Documentation  Taken 03/03/2024 0600 by Clayborn Bigness, RN  Oral Care:   tongue brushed   suction provided  Taken 03/03/2024 0400 by Clayborn Bigness, RN  Oral Care:   tongue brushed   suction provided   mouth swabbed  Taken 03/03/2024 0300 by Clayborn Bigness, RN  Oral Care: suction provided  Taken 03/03/2024 0200 by Clayborn Bigness, RN  Oral Care: suction provided  Taken 03/03/2024 0100 by Clayborn Bigness, RN  Oral Care: suction provided  Taken 03/03/2024 0000 by Clayborn Bigness, RN  Oral Care: suction provided  Taken 03/02/2024 2300 by Clayborn Bigness, RN  Oral Care: suction provided  Taken 03/02/2024 2200 by Clayborn Bigness, RN  Oral Care: suction provided  Taken 03/02/2024 2100 by Clayborn Bigness, RN  Oral Care: suction provided  Taken 03/02/2024 2000 by Clayborn Bigness, RN  Airway/Ventilation Management: airway patency maintained  Oral Care: suction provided  Goal: Mechanical Ventilation Liberation  Outcome: Ongoing - Unchanged  Goal: Optimal Nutrition Delivery  Outcome: Ongoing - Unchanged  Goal: Absence of Device-Related Skin and Tissue Injury  Outcome: Ongoing - Unchanged  Goal: Absence of Ventilator-Induced Lung Injury  Outcome: Ongoing - Unchanged  Intervention: Prevent Ventilator-Associated Pneumonia  Recent Flowsheet Documentation  Taken 03/03/2024 0600 by Clayborn Bigness, RN  Oral Care:   tongue brushed   suction provided  Taken 03/03/2024 0400 by Lattie Corns, Ara Kussmaul, RN  Head of Bed [facility] Positioning: HOB at 30-45 degrees  Oral Care:   tongue brushed   suction provided   mouth swabbed  Taken 03/03/2024 0300 by Lattie Corns, Ara Kussmaul, RN  Head of Bed [facility]) Positioning: HOB at 30-45 degrees  Oral Care: suction provided  Taken 03/03/2024 0200 by Lattie Corns, Ara Kussmaul, RN  Head of Bed [facility]) Positioning: HOB at 30-45 degrees  Oral Care: suction provided  Taken  03/03/2024 0100 by Lattie Corns, Ara Kussmaul, RN  Head of Bed [facility]) Positioning: HOB at 30-45 degrees  Oral Care: suction provided  Taken 03/03/2024 0000 by Lattie Corns, Ara Kussmaul, RN  Head of Bed [facility]) Positioning: HOB at 30-45 degrees  Oral Care: suction provided  Taken 03/02/2024 2300 by Lattie Corns, Ara Kussmaul, RN  Head of Bed [facility]) Positioning: HOB at 30-45 degrees  Oral Care: suction provided  Taken 03/02/2024 2200 by Lattie Corns, Ara Kussmaul, RN  Head of Bed [facility]) Positioning: HOB at 30-45 degrees  Oral Care: suction provided  Taken 03/02/2024 2100 by Lattie Corns, Ara Kussmaul, RN  Head of Bed [facility]) Positioning: HOB at 30-45 degrees  Oral Care: suction provided  Taken 03/02/2024 2000 by Lattie Corns, Ara Kussmaul, RN  Head of Bed [facility]) Positioning: HOB at 30-45 degrees  Oral Care: suction provided     Problem: Artificial Airway  Goal: Effective Communication  Outcome: Ongoing - Unchanged  Goal: Optimal Device Function  Outcome: Ongoing - Unchanged  Intervention: Optimize Device Care and Function  Recent Flowsheet Documentation  Taken 03/03/2024 0600 by Clayborn Bigness, RN  Oral Care:   tongue brushed   suction provided  Taken 03/03/2024 0400 by Christene Slates S, RN  Oral Care:   tongue brushed   suction provided   mouth swabbed  Taken 03/03/2024 0300 by Clayborn Bigness, RN  Oral Care: suction provided  Taken 03/03/2024 0200 by Clayborn Bigness, RN  Oral Care: suction provided  Taken 03/03/2024 0100 by Clayborn Bigness, RN  Oral Care: suction provided  Taken 03/03/2024 0000 by Clayborn Bigness, RN  Oral Care: suction provided  Taken 03/02/2024 2300 by Clayborn Bigness, RN  Oral Care: suction provided  Taken 03/02/2024 2200 by Clayborn Bigness, RN  Oral Care: suction provided  Taken 03/02/2024 2100 by Clayborn Bigness, RN  Oral Care: suction provided  Taken 03/02/2024 2000 by Lattie Corns, Ara Kussmaul, RN  Airway/Ventilation Management: airway patency maintained  Oral Care: suction provided  Goal: Absence of Device-Related Skin or Tissue Injury  Outcome: Ongoing - Unchanged     Problem: Adult Inpatient Plan of Care  Goal: Absence of Hospital-Acquired Illness or Injury  Intervention: Prevent Skin Injury  Recent Flowsheet Documentation  Taken 03/03/2024 0200 by Clayborn Bigness, RN  Positioning for Skin: Left  Taken 03/03/2024 0000 by Clayborn Bigness, RN  Positioning for Skin: Right  Taken 03/02/2024 2200 by Clayborn Bigness, RN  Positioning for Skin: Left  Taken 03/02/2024 2000 by Clayborn Bigness, RN  Positioning for Skin: Right     Problem: Fall Injury Risk  Goal: Absence of Fall and Fall-Related Injury  Outcome: Ongoing - Unchanged  Intervention: Promote Injury-Free Environment  Recent Flowsheet Documentation  Taken 03/03/2024 0200 by Clayborn Bigness, RN  Safety Interventions: family at bedside  Taken 03/03/2024 0000 by Clayborn Bigness, RN  Safety Interventions: family at bedside  Taken 03/02/2024 2200 by Clayborn Bigness, RN  Safety Interventions: family at bedside  Taken 03/02/2024 2000 by Clayborn Bigness, RN  Safety Interventions: family at bedside     Problem: Fall Injury Risk  Goal: Absence of Fall and Fall-Related Injury  Intervention: Promote Injury-Free Environment  Recent Flowsheet Documentation  Taken 03/03/2024 0200 by Clayborn Bigness, RN  Safety Interventions: family at bedside  Taken 03/03/2024 0000 by Lattie Corns,  Ara Kussmaul, RN  Safety Interventions: family at bedside  Taken 03/02/2024 2200 by Lattie Corns, Ara Kussmaul, RN  Safety Interventions: family at bedside  Taken 03/02/2024 2000 by Lattie Corns, Ara Kussmaul, RN  Safety Interventions: family at bedside     Problem: Infection  Goal: Absence of Infection Signs and Symptoms  Outcome: Ongoing - Unchanged     Problem: Adult Inpatient Plan of Care  Goal: Rounds/Family Conference  Outcome: Ongoing - Unchanged     Problem: Wound  Goal: Improved Oral Intake  Outcome: Ongoing - Unchanged     Problem: Wound  Goal: Absence of Infection Signs and Symptoms  Outcome: Ongoing - Unchanged     Problem: Wound  Goal: Optimal Functional Ability  Outcome: Ongoing - Unchanged     Problem: Wound  Goal: Optimal Coping  Outcome: Ongoing - Unchanged     Problem: Wound  Goal: Optimal Pain Control and Function  Outcome: Ongoing - Unchanged     Problem: Wound  Goal: Skin Health and Integrity  Outcome: Ongoing - Unchanged  Intervention: Optimize Skin Protection  Recent Flowsheet Documentation  Taken 03/03/2024 0400 by Lattie Corns, Ara Kussmaul, RN  Head of Bed [facility]) Positioning: HOB at 30-45 degrees  Taken 03/03/2024 0300 by Lattie Corns, Ara Kussmaul, RN  Head of Bed [facility]) Positioning: HOB at 30-45 degrees  Taken 03/03/2024 0200 by Lattie Corns, Ara Kussmaul, RN  Head of Bed [facility]) Positioning: HOB at 30-45 degrees  Taken 03/03/2024 0100 by Lattie Corns, Ara Kussmaul, RN  Head of Bed [facility]) Positioning: HOB at 30-45 degrees  Taken 03/03/2024 0000 by Clayborn Bigness, RN  Head of Bed [facility]) Positioning: HOB at 30-45 degrees  Taken 03/02/2024 2300 by Lattie Corns, [place] S, RN  Head of Bed [facility]) Positioning: HOB at 30-45 degrees  Taken 03/02/2024 2200 by Lattie Corns, [place] S, RN  Head of Bed [facility]) Positioning: HOB at 30-45 degrees  Taken 03/02/2024 2100 by Lattie Corns, Ara Kussmaul, RN  Head of Bed [facility]) Positioning: HOB at 30-45 degrees  Taken 03/02/2024 2000 by Lattie Corns, [place] S, RN  Head of Bed [facility]) Positioning: HOB at 30-45 degrees

## 2024-03-03 NOTE — Unmapped (Signed)
 Pt remains on PRVC 376mL/+08/18/39% and is tolerating settings well.  BS diminished.  Very minimal secretions.  Treatments given as scheduled.  Re-taped pts ETT today.  All emergency airway supplies present at the [place].  Will continue to monitor.

## 2024-03-03 NOTE — Unmapped (Signed)
 Patient stable on current vent settings. ETT intact and secure. SBT passed. No breakdown or redness noted.    Vent Mode: PRVC  FiO2 (%): 40 %  S RR: 12  S VT: 330 mL  PEEP: 8 cm H20    Problem: Mechanical Ventilation Invasive  Goal: Effective Communication  Outcome: Ongoing - Unchanged  Goal: Optimal Device Function  Outcome: Ongoing - Unchanged  Intervention: Optimize Device Care and Function  Recent Flowsheet Documentation  Taken 03/03/2024 0513 by Glenetta Hew, RRT  Airway/Ventilation Management:   airway patency maintained   calming measures promoted   humidification applied   pulmonary hygiene promoted  Taken 03/03/2024 0245 by Glenetta Hew, RRT  Airway/Ventilation Management:   airway patency maintained   calming measures promoted   humidification applied   pulmonary hygiene promoted  Oral Care: suction provided  Taken 03/02/2024 2049 by Glenetta Hew, RRT  Airway/Ventilation Management:   airway patency maintained   calming measures promoted   humidification applied   pulmonary hygiene promoted  Oral Care:   suction provided   teeth brushed   tongue brushed  Goal: Mechanical Ventilation Liberation  Outcome: Ongoing - Unchanged  Goal: Optimal Nutrition Delivery  Outcome: Ongoing - Unchanged  Goal: Absence of Device-Related Skin and Tissue Injury  Outcome: Ongoing - Unchanged  Goal: Absence of Ventilator-Induced Lung Injury  Outcome: Ongoing - Unchanged  Intervention: Prevent Ventilator-Associated Pneumonia  Recent Flowsheet Documentation  Taken 03/03/2024 0513 by Glenetta Hew, RRT  Head of Bed [facility]) Positioning: HOB at 30-45 degrees  VAP Prevention Bundle:   HOB elevation maintained   sedation interruption performed   spontaneous breathing trial performed  Taken 03/03/2024 0245 by Glenetta Hew, RRT  Head of Bed [facility]) Positioning: HOB at 30-45 degrees  VAP Prevention Bundle:   HOB elevation maintained   vent circuit breaks minimized  Oral Care: suction provided  Taken 03/02/2024 2049 by Glenetta Hew, RRT  Head of Bed [facility]) Positioning: HOB at 30-45 degrees  VAP Prevention Bundle:   HOB elevation maintained   oral care regularly provided   vent circuit breaks minimized  Oral Care:   suction provided   teeth brushed   tongue brushed     Problem: Artificial Airway  Goal: Effective Communication  Outcome: Ongoing - Unchanged  Goal: Optimal Device Function  Outcome: Ongoing - Unchanged  Intervention: Optimize Device Care and Function  Recent Flowsheet Documentation  Taken 03/03/2024 0513 by Glenetta Hew, RRT  Airway/Ventilation Management:   airway patency maintained   calming measures promoted   humidification applied   pulmonary hygiene promoted  Taken 03/03/2024 0245 by Glenetta Hew, RRT  Airway/Ventilation Management:   airway patency maintained   calming measures promoted   humidification applied   pulmonary hygiene promoted  Oral Care: suction provided  Taken 03/02/2024 2049 by Glenetta Hew, RRT  Airway/Ventilation Management:   airway patency maintained   calming measures promoted   humidification applied   pulmonary hygiene promoted  Oral Care:   suction provided   teeth brushed   tongue brushed  Goal: Absence of Device-Related Skin or Tissue Injury  Outcome: Ongoing - Unchanged

## 2024-03-03 NOTE — Unmapped (Signed)
 [facility] PROGRESS NOTE     Date of Service: 03/03/2024    Hospital Day: LOS: 5 days        Surgery Date: TBD   Surgical Attending: Merri Ray, MD    Critical Care Attending: Thalia Party, MD    Interval History:   Soft pressures overnight. Albumin bolus given. NE started and increased up to 8. Aline placed. HRs remained in 120s-130s on amio gtt.     Febrile to 38.8 yesterday evening. Afebrile overnight. PVLs with evidence of acute obstruction in R basilic v.    Intubated, but passing SBT.    Hypertonic saline started at 32mL/hr. Increased to 72mL/hr    History of Present Illness:   Tammy Braun is a 83 y.o. female with PMH bladder cancer with cystectomy and ileal conduit, COPD, recent dx of afib, moderate-severe TR, CAD (high calcification score), T2DM presented to [facility] with projectile vomiting and abdominal pain, found to have SBO with transition point at level of ileostomy on CT A/P. On initial presentation, labs remarkable for leukocytosis thought to be iso hemoconcentration, AKI on CKD, mildly elevated venous lactate improved with resuscitation. Admitted to [facility] for worsening hypoxia on HFNC requiring intubation in setting of hiatal hernia. OR on 02/28/24 for ex-lap, reduciton of parastomal hernia, small bowel resection with primary anastomosis, lysis of adhesions, abdomen left open with ABThera in place.     Hospital Course:  - 02/26/24: Admitted to [facility], floor status, consult to urology for possible ileal conduit revision.   - 02/27/24: Patient level of care escalated to step-down iso increased oxygen needs while in ED.  - 02/28/24: Upgraded to ICU. Intubated at bed-side. OR for ex-lap, reduction of parastomal hernia, small bowel resection with primary anastomosis, lysis of adhesions, abdomen left open with ABThera in place.      ASSESSMENT & PLAN:     Neurologic:  - Pain: Sch IV Tylenol, IV Robaxin, HM IV PRN 0.5-1mg   - Fent gtt and pushes  - Sedation: Propofol gtt     Cardiovascular:  #HTN, HLD:   - Chronic.    - MAP goal > 65    > requiring NE @8   - Holding home ASA and Lipitor    #Afib/flutter w RVR:   - Prior hx of paroxysmal afib. CHADSvasc = 6, not anticoagulated PTA per chart review.   - IV amio (bolus + gtt) if HR > 150 after bolus  - Holding home diltiazem, question efficacy given likely poor absorption  - Echo 3/1: LVEF > 55%. Severe TR.  - Pro-BNP: Pending     Respiratory:  #Acute on chronic hypoxemia  #Hx of COPD:   - 2/28: Intubated  - Continue pulmonary toilet  - Home incruse ellipta   - PRN albuterol   - Daily SBT - passing  - Daily ABG    FEN/GI:  F: Medlocked, bolus PRN  E: Replete electrolytes PRN  N: NPO   - TFs paused for OR, will resume upon return.  - GI prophylaxis: zofran prn for nausea; IV Protonix   - Bowel regimen: miralax, senna    #S/p ex-lap, reduction of parastomal hernia, small bowel resection with primary anastomosis, lysis of adhesions, abdomen left open with ABThera in place 2/28    #Large hiatal hernia, chronic  - Non-operative, chronic    Renal/Genitourinary:  #AKI on CKD, prerenal:   - Cr 3.26 < 2.4 (Baseline 1.4)  - FeNa 2/27 1.9% (Indeterminate)  - FeNa 3/3 0.5%  - Fluids?    #  Hydronephrosis:  - Caution with perioperative fluid resuscitation per Cards   - Consideration for c/s to nephrology iso hyperK, CKD   - If persistent AKI after closure consider renal ultrasound to assess hydro/obstruction    #H/o bladder cancer s/p radical cystectomy with ileal conduit (2012):  #Parastomal hernia:  - SRU consulted, following  - Strict I&Os  - Foley in conduit, low UOP  - Tentative plan for OR tomorrow for urostomy revision/abdominal closure    Endocrine:   - Glycemic Control: SSI    Hematologic:   - Monitor CBC daily, transfuse products as indicated  - Hb 9.9 from 11.3, suspect dilutional. No concerns for active bleeding.      Immunologic/Infectious Disease:  - Febrile, leukocytosis to 11.9  - Cultures:   - Ucx (2/26): Mixed GP/GN organisms    - RPP (2/27): negative   - Blood cultures (3/4): pending   - BAL (3/4): pending  - Abx:    - Zosyn x4d (3/1 - 3/5), tailor pending infectious work up     Musculoskeletal:   NAI    Daily Care Checklist:   - Stress Ulcer Prevention: Yes: Mechanically ventilated  - DVT Prophylaxis: Chemical: SQ Heparin  - Daily Awakening: Yes  - Spontaneous Breathing Trial:  post-op  - Indication for Central/PICC Line: Yes  Infusions requiring central access, Hemodynamic monitoring, and Inadequate peripheral access, RIJ triple lumen  - Indication for Urinary Catheter: Yes  Strict intake and output, Agressive diuresis/hydration, and Urinary retention/obstruction  - Diagnostic images/reports of past 24hrs reviewed: Yes    Disposition:   - Continue ICU care  - PT/OT consulted:  will consult post OR    Oliver Hum, MD   Surgical Intensive Care Unit  [facility]       SUBJECTIVE:      Intubated/sedated     OBJECTIVE:     Physical Exam:  Constitutional: Lying supine, NAD  Neurologic: Sedated, +cough/gag/corneal reflexe   Respiratory: Mechanically ventilated via ETT   Cardiovascular: Tachycardic, irregular rhythm, no peripheral edema to LE   Gastrointestinal: Mildly distended abdomen, ABThera in place draining SS fluid. Urostomy with Foley in stoma draining clear yellow urine.   Musculoskeletal: Trace to no BLE edema.  Skin: Warm and dry     Temp:  [37.3 ??C (99.1 ??F)-38.8 ??C (101.8 ??F)] 37.3 ??C (99.1 ??F)  Heart Rate:  [117-143] 142  SpO2 Pulse:  [114-139] 129  Resp:  [15-25] 24  BP: (74-129)/(48-81) 76/54  MAP (mmHg):  [53-91] 59  A BP-1: (92-142)/(45-71) 129/66  MAP:  [67 mmHg-94 mmHg] 91 mmHg  FiO2 (%):  [40 %-50 %] 40 %  SpO2:  [95 %-100 %] 95 %       Recent Laboratory Results:  Recent Labs     Units 03/02/24  1022   PHART  7.32*   PCO2ART mm Hg 39.1   PO2ART mm Hg 154.0*   HCO3ART mmol/L 20*   BEART  -5.5*   O2SATART % 99.0     Recent Labs     Units 03/02/24  2021 03/03/24  0039 03/03/24  0418   NA mmol/L 143 144 145   K mmol/L 4.9* 4.5 4.4   CL mmol/L 105 108* 109*   CO2 mmol/L 21.0 21.0 20.0   BUN mg/dL 78* 73* 75*   CREATININE mg/dL 1.61* 0.96* 0.45*   GLU mg/dL 409* 811* 914*     Lab Results   Component Value Date  BILITOT 0.5 02/26/2024    BILITOT 0.3 01/06/2024    ALT 12 03/01/2024    ALT 20 02/26/2024    AST 12 03/01/2024    AST 31 02/26/2024    GGT 16 09/17/2011    GGT 354 (H) 05/03/2011    ALKPHOS 101 02/26/2024    ALKPHOS 90 01/06/2024    PROT 9.2 (H) 02/26/2024    PROT 7.5 01/06/2024    ALBUMIN 4.5 02/26/2024    ALBUMIN 3.2 (L) 01/06/2024     Recent Labs     Units 03/02/24  1246 03/02/24  1721 03/02/24  2034 03/03/24  0159   POCGLU mg/dL 161 096 045 409     Recent Labs     Units 03/02/24  0402 03/02/24  1600 03/03/24  0418   WBC 10*9/L 10.9 12.8* 11.9*   RBC 10*12/L 3.96 4.12 3.62*   HGB g/dL 81.1* 91.4 9.9*   HCT % 34.3 35.6 31.4*   MCV fL 86.5 86.4 86.8   MCH pg 28.1 27.4 27.2   MCHC g/dL 78.2 95.6* 21.3*   RDW % 18.1* 18.0* 17.9*   PLT 10*9/L 208 237 205   MPV fL 8.5 9.0 8.8     No results for input(s): INR, APTT in the last 24 hours.    Invalid input(s): PTPATIENT   Lines & Tubes:   Patient Lines/Drains/Airways Status       Active Peripheral & Central Intravenous Access       Name Placement date Placement time Site Days    Peripheral IV 02/26/24 Anterior;Right;Upper Arm 02/26/24  1916  Arm  5    CVC Triple Lumen 02/28/24 Non-tunneled Right Internal jugular 02/28/24  1641  Internal jugular  3                     Patient Lines/Drains/Airways Status       Active Wounds       Name Placement date Placement time Site Days    Negative Pressure Wound Therapy Abdomen Mid 02/28/24  2115  Abdomen  3    Surgical Site 04/08/18 Shoulder Left 04/08/18  1338  -- 2155    Surgical Site 06/05/22 Eye Left 06/05/22  0928  -- 636    Surgical Site 06/19/22 Eye Right 06/19/22  1024  -- 622    Surgical Site 02/28/24 Abdomen 02/28/24  2055  -- 3    Surgical Site 03/01/24 Abdomen 03/01/24  1902  -- 1    Wound 06/24/20 Other (comment) Buttocks inner right buttock 06/24/20  1010  Buttocks  1347                     Respiratory/ventilator settings for last 24 hours:   Vent Mode: PRVC  FiO2 (%): 40 %  S RR: 12  S VT: 330 mL  PEEP: 8 cm H20  PR SUP: 5 cm H20    Intake/Output last 3 shifts:  I/O last 3 completed shifts:  In: 1809.9 [I.V.:1157.9; NG/GT:20; IV Piggyback:632.1]  Out: 1802 [Urine:480; Blood:2]    Daily/Recent Weight:  68.6 kg (151 lb 3.8 oz)    BMI:  Body mass index is 28.59 kg/m??.                                  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    Chronic kidney disease     Depression, psychotic (CMS-HCC)     Diabetes mellitus (CMS-HCC)     in past    Emphysema of lung (CMS-HCC)     Financial difficulties     Frail elderly     Hearing impairment     Hernia     History of transfusion     Hyperlipidemia     Hypertension     Impaired mobility     Osteoporosis     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/08/2015    Visual impairment      Past Surgical History:   Procedure Laterality Date    ABDOMINAL SURGERY      BLADDER SURGERY      BREAST CYST EXCISION      CHEMOTHERAPY  2012    bladder    GALLBLADDER SURGERY      stone removal    ILEOSTOMY  2012    PR COLONOSCOPY FLX DX W/COLLJ SPEC WHEN PFRMD N/A 02/09/2015    Procedure: COLONOSCOPY, FLEXIBLE, PROXIMAL TO SPLENIC FLEXURE; DIAGNOSTIC, W/WO COLLECTION SPECIMEN BY BRUSH OR WASH;  Surgeon: Dewaine Conger, MD;  Location: [facility] [place];  Service: Gastroenterology    PR EXPLORATORY OF ABDOMEN N/A 02/28/2024    Procedure: EXPLORATORY LAPAROTOMY, EXPLORATORY CELIOTOMY WITH OR WITHOUT BIOPSY(S);  Surgeon: Renda Rolls, MD;  Location: [facility];  Service: General Surgery    PR RECONSTR TOTAL SHOULDER IMPLANT Left 04/08/2018    Procedure: ARTHROPLASTY, GLENOHUMERAL JOINT; TOTAL SHOULDER(GLENOID & PROXIMAL HUMERAL REPLACEMENT(EG, TOTAL SHOULDER);  Surgeon: Tomasa Rand, MD;  Location: [facility] OR [facility];  Service: Ortho Sports Medicine    PR SIGMOIDOSCOPY,BIOPSY N/A 03/11/2015    Procedure: SIGMOIDOSCOPY, FLEXIBLE; WITH BIOPSY, SINGLE OR MULTIPLE;  Surgeon: Wilburt Finlay, MD;  Location: [facility];  Service: Gastroenterology    PR XCAPSL CTRC RMVL INSJ IO LENS PROSTH W/O ECP Left 06/05/2022    Procedure: EXTRACAPSULAR CATARACT REMOVAL W/INSERTION OF INTRAOCULAR LENS PROSTHESIS, MANUAL OR MECHANICAL TECHNIQUE WITHOUT ENDOSCOPIC CYCLOPHOTOCOAGULATION;  Surgeon: Garner Gavel, MD;  Location: [facility] OR [facility];  Service: Ophthalmology    PR XCAPSL CTRC RMVL INSJ IO LENS PROSTH W/O ECP Right 06/19/2022    Procedure: EXTRACAPSULAR CATARACT REMOVAL W/INSERTION OF INTRAOCULAR LENS PROSTHESIS, MANUAL OR MECHANICAL TECHNIQUE WITHOUT ENDOSCOPIC CYCLOPHOTOCOAGULATION;  Surgeon: Garner Gavel, MD;  Location: [facility] OR [facility];  Service: Ophthalmology     Scheduled Medications:   acetaminophen  1,000 mg Intravenous Q8H    [Provider Hold] aspirin  81 mg Oral Daily    atorvastatin  40 mg Oral Daily    budesonide (PULMICORT) nebulizer solution  0.5 mg Nebulization BID (RT)    [Provider Hold] dilTIAZem  120 mg Oral Daily    flu vacc [id number](39yr up)-PF  0.5 mL Intramuscular During hospitalization    [START ON 03/14/2024] fluPHENAZine decanoate  50 mg Intramuscular Q30 Days    [Provider Hold] furosemide  20 mg Oral Daily    [Provider Hold] gabapentin  100 mg Oral TID    heparin (porcine) for subcutaneous use  5,000 Units Subcutaneous Q8H SCH    insulin lispro  0-20 Units Subcutaneous Q4H SCH    ipratropium  500 mcg Nebulization Q6H (RT)    pantoprazole (Protonix) intravenous solution  40 mg Intravenous Daily  piperacillin-tazobactam  4.5 g Intravenous Q12H     Continuous Infusions:   amiodarone 0.5 mg/min (03/03/24 0400)    fentaNYL citrate (PF) 50 mcg/mL infusion 25 mcg/hr (03/03/24 0400) NORepinephrine bitartrate-NS 8 mcg/min (03/03/24 0400)    propofol 10 mg/mL infusion Stopped (02/29/24 0718)    sodium chloride 3% 25 mL/hr (03/03/24 0400)     PRN Medications:  albuterol, [Provider Hold] cyclobenzaprine, dextrose in water, fentaNYL (PF) **OR** fentaNYL (PF), glucagon, HYDROmorphone **OR** HYDROmorphone, HYDROmorphone, nicotine polacrilex **OR** nicotine polacrilex, ondansetron

## 2024-03-03 NOTE — Unmapped (Signed)
 Acute Care Surgery Operative Note    Date of Surgery: 03/03/2024  Admit Date: 02/26/2024  Performing Service: Trauma  Surgeons and Role:     * Dariel Betzer, Abelino Derrick, MD - Primary     * Imogene Burn, Kathreen Devoid, MD - Resident - Assisting     * Manson Passey Baldwin Crown, MD - Resident - Assisting       Op Note    Pre-op Diagnosis: SBO; open abdomen    Post-op Diagnosis: SBO; successful closure of abdomen    Procedures performed:  Reopening exploratory laparotomy  Abdominal closure  Provena wound vac placement (> 50 square cm of nondisposable DME)    Findings: Diffusely dilated SB, milked contents back to decompress. Diffuse edema, ascites. Mobilized fascia bilaterally. Closed primarily in the midline. Peak pressures did increase to 27 and desatted requiring increased FiO2 but able to wean down. 19 blake drain in abdomen/pelvis, 15 french blake drain on top of fascia.     Anesthesia: General    Estimated Blood Loss: 5 mL    Complications: None    Specimens: None collected    Description of procedure:  Patient brought to the OR. Timeout performed. Anesthesia induced. Abdomen prepped and draped in usual sterile fashion. Abthera drape removed. Abdomen explored. The small bowel was run and was normal. The small bowel was diffusely dilated. Bowel contents were milked back towards LOT to decompress.  We then identified the midline fascia bilaterally. We created subcutaneous flaps on top of the fascia bilaterally, taking care to avoid the stoma in the right abdomen. We then reapproximated the fascia and it reached without significant tension. We then placed a 19 french blake drain into the abdomen/pelvis and brought it out the left abdomen. IT was secured with a drain stitch. We then closed the midline fascia using interrupted 0-PDS figure of eight sutures. Once the abdomen was closed, peak pressures on the vent increased from 22 to 27. The patient did desaturate during closure but responded to increased FiO2; this was then weaned back down during the remainder of the case. A 15 french blake drain was placed on top of the fascia and brought out the left abdomen and secured with a nylon drain stitch. The skin was then closed with staples and provena wound vac applied.     Lap and instrument counts correct. X-ray revealed no retained foreign body. Wanding negative. Patient was transferred back to SICU at the end of the case.     Surgeon Notes: I was present and scrubbed for the entire procedure     Date: 03/03/2024  Time: 9:36 AM    Fredrik Rigger MD

## 2024-03-03 NOTE — Unmapped (Signed)
 We discussed risk benefit of getting a CT PE scan in regards to her GFR. Her ECHO earlier demonstrated concern for an acute Pulmonary Embolus, with a elevated A-a gradient, elevated troponin. We believe it would be more beneficial to obtain the CT scan to rule out an acute pulmonary embolus with features suggestive on ECHO (McConnell sign is noted- hypokinesia of right ventricular  free wall with sparing of the apex).    This was discussed with surgical ICU fellow Dr. Golden Hurter

## 2024-03-03 NOTE — Unmapped (Signed)
 ICU TRANSPORT NOTE    Destination: CT    Departing Unit: Dorothey Baseman Time: 2220    Return Unit: [facility]  Return Time: 2320    [facility] patient ID band verified  Allergies Reviewed  Code Status at time of transport: Full    Report received from primary nurse via [employer]. Handoff performed of continuous drip/infusion Patient transported via stretcher under ICU level of care. See vital signs during transport via [employer]. O2 via Ventilator @ 40 %. Patient is unable to follow commands. Patient tolerated procedure/scan well. Universal precautions maintained throughout transport.    Update and care given to primary nurse. See Doc Flowsheets/MAR for additional transportation documentation. Proper body mechanics and safe patient handling equipment were utilized throughout transport.

## 2024-03-04 LAB — BASIC METABOLIC PANEL
ANION GAP: 15 mmol/L — ABNORMAL HIGH (ref 5–14)
ANION GAP: 16 mmol/L — ABNORMAL HIGH (ref 5–14)
ANION GAP: 16 mmol/L — ABNORMAL HIGH (ref 5–14)
ANION GAP: 17 mmol/L — ABNORMAL HIGH (ref 5–14)
ANION GAP: 17 mmol/L — ABNORMAL HIGH (ref 5–14)
ANION GAP: 19 mmol/L — ABNORMAL HIGH (ref 5–14)
BLOOD UREA NITROGEN: 77 mg/dL — ABNORMAL HIGH (ref 9–23)
BLOOD UREA NITROGEN: 83 mg/dL — ABNORMAL HIGH (ref 9–23)
BLOOD UREA NITROGEN: 83 mg/dL — ABNORMAL HIGH (ref 9–23)
BLOOD UREA NITROGEN: 85 mg/dL — ABNORMAL HIGH (ref 9–23)
BLOOD UREA NITROGEN: 92 mg/dL — ABNORMAL HIGH (ref 9–23)
BLOOD UREA NITROGEN: 95 mg/dL — ABNORMAL HIGH (ref 9–23)
BUN / CREAT RATIO: 24
BUN / CREAT RATIO: 26
BUN / CREAT RATIO: 26
BUN / CREAT RATIO: 26
BUN / CREAT RATIO: 26
BUN / CREAT RATIO: 26
CALCIUM: 7.9 mg/dL — ABNORMAL LOW (ref 8.7–10.4)
CALCIUM: 7.9 mg/dL — ABNORMAL LOW (ref 8.7–10.4)
CALCIUM: 8.1 mg/dL — ABNORMAL LOW (ref 8.7–10.4)
CALCIUM: 8.3 mg/dL — ABNORMAL LOW (ref 8.7–10.4)
CALCIUM: 8.4 mg/dL — ABNORMAL LOW (ref 8.7–10.4)
CALCIUM: 8.5 mg/dL — ABNORMAL LOW (ref 8.7–10.4)
CHLORIDE: 108 mmol/L — ABNORMAL HIGH (ref 98–107)
CHLORIDE: 108 mmol/L — ABNORMAL HIGH (ref 98–107)
CHLORIDE: 108 mmol/L — ABNORMAL HIGH (ref 98–107)
CHLORIDE: 109 mmol/L — ABNORMAL HIGH (ref 98–107)
CHLORIDE: 110 mmol/L — ABNORMAL HIGH (ref 98–107)
CHLORIDE: 111 mmol/L — ABNORMAL HIGH (ref 98–107)
CO2: 17 mmol/L — ABNORMAL LOW (ref 20.0–31.0)
CO2: 18 mmol/L — ABNORMAL LOW (ref 20.0–31.0)
CO2: 19 mmol/L — ABNORMAL LOW (ref 20.0–31.0)
CO2: 19 mmol/L — ABNORMAL LOW (ref 20.0–31.0)
CO2: 20 mmol/L (ref 20.0–31.0)
CO2: 20 mmol/L (ref 20.0–31.0)
CREATININE: 3.15 mg/dL — ABNORMAL HIGH (ref 0.55–1.02)
CREATININE: 3.16 mg/dL — ABNORMAL HIGH (ref 0.55–1.02)
CREATININE: 3.17 mg/dL — ABNORMAL HIGH (ref 0.55–1.02)
CREATININE: 3.28 mg/dL — ABNORMAL HIGH (ref 0.55–1.02)
CREATININE: 3.57 mg/dL — ABNORMAL HIGH (ref 0.55–1.02)
CREATININE: 3.6 mg/dL — ABNORMAL HIGH (ref 0.55–1.02)
EGFR CKD-EPI (2021) FEMALE: 12 mL/min/{1.73_m2} — ABNORMAL LOW (ref >=60)
EGFR CKD-EPI (2021) FEMALE: 12 mL/min/{1.73_m2} — ABNORMAL LOW (ref >=60)
EGFR CKD-EPI (2021) FEMALE: 14 mL/min/{1.73_m2} — ABNORMAL LOW (ref >=60)
EGFR CKD-EPI (2021) FEMALE: 14 mL/min/{1.73_m2} — ABNORMAL LOW (ref >=60)
EGFR CKD-EPI (2021) FEMALE: 14 mL/min/{1.73_m2} — ABNORMAL LOW (ref >=60)
EGFR CKD-EPI (2021) FEMALE: 14 mL/min/{1.73_m2} — ABNORMAL LOW (ref >=60)
GLUCOSE RANDOM: 119 mg/dL (ref 70–179)
GLUCOSE RANDOM: 128 mg/dL (ref 70–179)
GLUCOSE RANDOM: 195 mg/dL — ABNORMAL HIGH (ref 70–99)
GLUCOSE RANDOM: 264 mg/dL — ABNORMAL HIGH (ref 70–99)
GLUCOSE RANDOM: 267 mg/dL — ABNORMAL HIGH (ref 70–179)
GLUCOSE RANDOM: 283 mg/dL — ABNORMAL HIGH (ref 70–179)
POTASSIUM: 3.7 mmol/L (ref 3.4–4.8)
POTASSIUM: 3.8 mmol/L (ref 3.4–4.8)
POTASSIUM: 3.9 mmol/L (ref 3.4–4.8)
POTASSIUM: 4 mmol/L (ref 3.4–4.8)
POTASSIUM: 4 mmol/L (ref 3.4–4.8)
POTASSIUM: 4.1 mmol/L (ref 3.4–4.8)
SODIUM: 143 mmol/L (ref 135–145)
SODIUM: 144 mmol/L (ref 135–145)
SODIUM: 144 mmol/L (ref 135–145)
SODIUM: 145 mmol/L (ref 135–145)
SODIUM: 145 mmol/L (ref 135–145)
SODIUM: 146 mmol/L — ABNORMAL HIGH (ref 135–145)

## 2024-03-04 LAB — CBC
HEMATOCRIT: 30.5 % — ABNORMAL LOW (ref 34.0–44.0)
HEMATOCRIT: 31.4 % — ABNORMAL LOW (ref 34.0–44.0)
HEMOGLOBIN: 10.1 g/dL — ABNORMAL LOW (ref 11.3–14.9)
HEMOGLOBIN: 10.2 g/dL — ABNORMAL LOW (ref 11.3–14.9)
MEAN CORPUSCULAR HEMOGLOBIN CONC: 33 g/dL (ref 32.0–36.0)
MEAN CORPUSCULAR HEMOGLOBIN CONC: 32.3 g/dL (ref 32.0–36.0)
MEAN CORPUSCULAR HEMOGLOBIN: 28.4 pg (ref 25.9–32.4)
MEAN CORPUSCULAR HEMOGLOBIN: 28 pg (ref 25.9–32.4)
MEAN CORPUSCULAR VOLUME: 86.1 fL (ref 77.6–95.7)
MEAN CORPUSCULAR VOLUME: 86.9 fL (ref 77.6–95.7)
MEAN PLATELET VOLUME: 8.8 fL (ref 6.8–10.7)
MEAN PLATELET VOLUME: 9.1 fL (ref 6.8–10.7)
PLATELET COUNT: 198 10*9/L (ref 150–450)
PLATELET COUNT: 215 10*9/L (ref 150–450)
RED BLOOD CELL COUNT: 3.54 10*12/L — ABNORMAL LOW (ref 3.95–5.13)
RED BLOOD CELL COUNT: 3.62 10*12/L — ABNORMAL LOW (ref 3.95–5.13)
RED CELL DISTRIBUTION WIDTH: 18.3 % — ABNORMAL HIGH (ref 12.2–15.2)
RED CELL DISTRIBUTION WIDTH: 18.4 % — ABNORMAL HIGH (ref 12.2–15.2)
WBC ADJUSTED: 14.1 10*9/L — ABNORMAL HIGH (ref 3.6–11.2)
WBC ADJUSTED: 18.4 10*9/L — ABNORMAL HIGH (ref 3.6–11.2)

## 2024-03-04 LAB — APTT
APTT: 29.3 s (ref 24.8–38.4)
APTT: 61.9 s — ABNORMAL HIGH (ref 24.8–38.4)
HEPARIN CORRELATION: 0.2
HEPARIN CORRELATION: 0.4

## 2024-03-04 LAB — FIBRINOGEN: FIBRINOGEN LEVEL: 607 mg/dL — ABNORMAL HIGH (ref 175–500)

## 2024-03-04 LAB — TRIGLYCERIDES: TRIGLYCERIDES: 235 mg/dL — ABNORMAL HIGH (ref 0–150)

## 2024-03-04 LAB — HEPATIC FUNCTION PANEL
ALBUMIN: 2.4 g/dL — ABNORMAL LOW (ref 3.4–5.0)
ALKALINE PHOSPHATASE: 58 U/L (ref 46–116)
ALT (SGPT): 9 U/L — ABNORMAL LOW (ref 10–49)
AST (SGOT): 22 U/L (ref <=34)
BILIRUBIN DIRECT: 0.1 mg/dL (ref 0.00–0.30)
BILIRUBIN TOTAL: 0.2 mg/dL — ABNORMAL LOW (ref 0.3–1.2)
PROTEIN TOTAL: 5.5 g/dL — ABNORMAL LOW (ref 5.7–8.2)

## 2024-03-04 LAB — PHOSPHORUS
PHOSPHORUS: 4.2 mg/dL (ref 2.4–5.1)
PHOSPHORUS: 4 mg/dL (ref 2.4–5.1)

## 2024-03-04 LAB — BLOOD GAS CRITICAL CARE PANEL, ARTERIAL
BASE EXCESS ARTERIAL: -7.6 — ABNORMAL LOW (ref -2.0–2.0)
CALCIUM IONIZED ARTERIAL (MG/DL): 4.44 mg/dL (ref 4.40–5.40)
GLUCOSE WHOLE BLOOD: 244 mg/dL — ABNORMAL HIGH (ref 70–179)
HCO3 ARTERIAL: 17 mmol/L — ABNORMAL LOW (ref 22–27)
HEMOGLOBIN BLOOD GAS: 8.8 g/dL — ABNORMAL LOW (ref 12.00–16.00)
LACTATE BLOOD ARTERIAL: 0.9 mmol/L (ref <1.3)
O2 SATURATION ARTERIAL: 99 % (ref 94.0–100.0)
PCO2 ARTERIAL: 33.3 mmHg — ABNORMAL LOW (ref 35.0–45.0)
PH ARTERIAL: 7.33 — ABNORMAL LOW (ref 7.35–7.45)
PO2 ARTERIAL: 137 mmHg — ABNORMAL HIGH (ref 80.0–110.0)
POTASSIUM WHOLE BLOOD: 3.3 mmol/L — ABNORMAL LOW (ref 3.4–4.6)
SODIUM WHOLE BLOOD: 143 mmol/L (ref 135–145)

## 2024-03-04 LAB — PROTIME-INR
INR: 1.06
PROTIME: 12.1 s (ref 9.9–12.6)

## 2024-03-04 LAB — MAGNESIUM
MAGNESIUM: 2.8 mg/dL — ABNORMAL HIGH (ref 1.6–2.6)
MAGNESIUM: 2.6 mg/dL (ref 1.6–2.6)

## 2024-03-04 MED ADMIN — propofol (DIPRIVAN) infusion 10 mg/mL: 0-50 ug/kg/min | INTRAVENOUS | @ 13:00:00 | Stop: 2024-03-04

## 2024-03-04 MED ADMIN — ipratropium (ATROVENT) 0.02 % nebulizer solution 500 mcg: 500 ug | RESPIRATORY_TRACT | @ 21:00:00

## 2024-03-04 MED ADMIN — insulin regular (HumuLIN,NovoLIN) injection 5 Units: 5 [IU] | SUBCUTANEOUS | @ 20:00:00 | Stop: 2024-03-04

## 2024-03-04 MED ADMIN — ipratropium (ATROVENT) 0.02 % nebulizer solution 500 mcg: 500 ug | RESPIRATORY_TRACT | @ 15:00:00

## 2024-03-04 MED ADMIN — insulin regular (HumuLIN,NovoLIN) injection 0-20 Units: 0-20 [IU] | SUBCUTANEOUS | @ 19:00:00

## 2024-03-04 MED ADMIN — insulin regular (HumuLIN,NovoLIN) injection 0-20 Units: 0-20 [IU] | SUBCUTANEOUS | @ 23:00:00

## 2024-03-04 MED ADMIN — propofol (DIPRIVAN) infusion 10 mg/mL: 0-50 ug/kg/min | INTRAVENOUS | @ 02:00:00

## 2024-03-04 MED ADMIN — heparin 25,000 Units/250 mL (100 units/mL) in 0.45% saline infusion (premade): 0-24 [IU]/kg/h | INTRAVENOUS | @ 22:00:00

## 2024-03-04 MED ADMIN — piperacillin-tazobactam (ZOSYN) IVPB (premix) 4.5 g: 4.5 g | INTRAVENOUS | @ 06:00:00 | Stop: 2024-03-04

## 2024-03-04 MED ADMIN — budesonide (PULMICORT) nebulizer solution 0.5 mg: .5 mg | RESPIRATORY_TRACT | @ 02:00:00

## 2024-03-04 MED ADMIN — NORepinephrine 8 mg in dextrose 5 % 250 mL (32 mcg/mL) infusion PMB: 0-15 ug/min | INTRAVENOUS | @ 11:00:00

## 2024-03-04 MED ADMIN — piperacillin-tazobactam (ZOSYN) 3.375 g in sodium chloride 0.9 % (NS) 100 mL IVPB-MBP: 3.375 g | INTRAVENOUS | @ 23:00:00 | Stop: 2024-03-11

## 2024-03-04 MED ADMIN — insulin lispro (HumaLOG) injection 0-20 Units: 0-20 [IU] | SUBCUTANEOUS | @ 07:00:00 | Stop: 2024-03-04

## 2024-03-04 MED ADMIN — amiodarone 360 mg in dextrose 5% 200 mL (1.8 mg/mL) infusion PMB: .5 mg/min | INTRAVENOUS | @ 03:00:00

## 2024-03-04 MED ADMIN — iohexol (OMNIPAQUE) 350 mg iodine/mL solution 75 mL: 75 mL | INTRAVENOUS | @ 04:00:00 | Stop: 2024-03-03

## 2024-03-04 MED ADMIN — methocarbamol (ROBAXIN) 1,000 mg in sodium chloride (NS) 0.9 % 50 mL IVPB: 1000 mg | INTRAVENOUS | @ 11:00:00 | Stop: 2024-03-04

## 2024-03-04 MED ADMIN — heparin (porcine) 5,000 unit/mL injection 5,000 Units: 5000 [IU] | SUBCUTANEOUS | @ 19:00:00 | Stop: 2024-03-04

## 2024-03-04 MED ADMIN — ipratropium (ATROVENT) 0.02 % nebulizer solution 500 mcg: 500 ug | RESPIRATORY_TRACT | @ 02:00:00

## 2024-03-04 MED ADMIN — propofol (DIPRIVAN) infusion 10 mg/mL: 0-50 ug/kg/min | INTRAVENOUS | @ 08:00:00 | Stop: 2024-03-04

## 2024-03-04 MED ADMIN — pantoprazole (Protonix) injection 40 mg: 40 mg | INTRAVENOUS | @ 14:00:00

## 2024-03-04 MED ADMIN — Parenteral Nutrition (CENTRAL): INTRAVENOUS | @ 02:00:00 | Stop: 2024-03-04

## 2024-03-04 MED ADMIN — heparin (porcine) 5,000 unit/mL injection 5,000 Units: 5000 [IU] | SUBCUTANEOUS | @ 03:00:00

## 2024-03-04 MED ADMIN — methocarbamol (ROBAXIN) 1,000 mg in sodium chloride (NS) 0.9 % 50 mL IVPB: 1000 mg | INTRAVENOUS | @ 06:00:00 | Stop: 2024-03-04

## 2024-03-04 MED ADMIN — heparin (porcine) 5,000 unit/mL injection 5,000 Units: 5000 [IU] | SUBCUTANEOUS | @ 10:00:00 | Stop: 2024-03-04

## 2024-03-04 MED ADMIN — budesonide (PULMICORT) nebulizer solution 0.5 mg: .5 mg | RESPIRATORY_TRACT | @ 15:00:00

## 2024-03-04 NOTE — Unmapped (Signed)
 [facility] PROGRESS NOTE     Date of Service: 03/04/2024    Hospital Day: LOS: 6 days        Surgery Date: TBD   Surgical Attending: Merri Ray, MD    Critical Care Attending: Thalia Party, MD    Interval History:   CTA last night with small segmental PE identified.     Successful abdominal closure with SRH yesterday AM. Remains under deep sedation due to elevated peak pressures after closure.     UOP remains low, 510 over 24h. Creatinine continues to rise.     History of Present Illness:   Tammy Braun is a 83 y.o. female with PMH bladder cancer with cystectomy and ileal conduit, COPD, recent dx of afib, moderate-severe TR, CAD (high calcification score), T2DM presented to [facility] with projectile vomiting and abdominal pain, found to have SBO with transition point at level of ileostomy on CT A/P. On initial presentation, labs remarkable for leukocytosis thought to be iso hemoconcentration, AKI on CKD, mildly elevated venous lactate improved with resuscitation. Admitted to [facility] for worsening hypoxia on HFNC requiring intubation in setting of hiatal hernia. OR on 02/28/24 for ex-lap, reduciton of parastomal hernia, small bowel resection with primary anastomosis, lysis of adhesions, abdomen left open with ABThera in place.     Hospital Course:  - 02/26/24: Admitted to [facility], floor status, consult to urology for possible ileal conduit revision.   - 02/27/24: Patient level of care escalated to step-down iso increased oxygen needs while in ED.  - 02/28/24: Upgraded to ICU. Intubated at bed-side. OR for ex-lap, reduction of parastomal hernia, small bowel resection with primary anastomosis, lysis of adhesions, abdomen left open with ABThera in place.   - 03/01/24: OR for attempt at abdominal closure. Left open due to desaturations during closure attempt. ABThera in place.  - 03/03/24: RTOR for abdominal closure.     ASSESSMENT & PLAN:     Neurologic:  - Pain: Sch IV Tylenol, IV Robaxin, HM IV PRN 0.5-1mg   - Fent gtt and pushes  - Sedation: Propofol gtt, wean as able given abdominal closure    Cardiovascular:  #HTN, HLD:   - Chronic.    - MAP goal > 65    > requiring NE @7   - Holding home ASA and Lipitor    #Afib/flutter w RVR:   - Prior hx of paroxysmal afib. CHADSvasc = 6, not anticoagulated PTA per chart review.   - Continue amio gtt   - Holding home diltiazem, question efficacy given likely poor absorption  - Echo 3/1: LVEF > 55%. Severe TR.  - Pro-BNP: 6,988 on 3/3     Respiratory:  #Acute on chronic hypoxemia  #Hx of COPD:   - 2/28: Intubated  - Continue pulmonary toilet  - Home incruse ellipta   - PRN albuterol   - Start weaning sedation with goal of extubation when appropriate  - Daily SBT   - Daily ABG    FEN/GI:  F: Medlocked, bolus PRN  E: Replete electrolytes PRN  N: NPO   - TFs paused for 24h post-op  - TPN discontinued iso pending blood cx and hyperglycemia  - GI prophylaxis: zofran prn for nausea; IV Protonix   - Bowel regimen: miralax, senna    #S/p ex-lap, reduction of parastomal hernia, small bowel resection with primary anastomosis, lysis of adhesions, abdomen left open with ABThera in place 2/28    #Large hiatal hernia, chronic  - Non-operative, chronic    Renal/Genitourinary:  #  AKI on CKD, prerenal:   - Cr 3.28 from 3.27 (Baseline 1.4)  - FeNa 2/27 1.9% (Indeterminate)  - FeNa 3/3 0.5%  - UOP remains low. Creatinine plateauing. CTM.     #Hydronephrosis:  - Caution with perioperative fluid resuscitation per Cards   - Consideration for c/s to nephrology iso hyperK, CKD   - If persistent AKI after closure consider renal ultrasound to assess hydro/obstruction    #H/o bladder cancer s/p radical cystectomy with ileal conduit (2012):  #Parastomal hernia:  - SRU consulted, following  - Strict I&Os  - Foley catheter per stoma, if it falls out, please notify Urology to replace   - If Cr continues to worsen, can consider obtaining renal US to assess for dilation with catheter in stoma     Endocrine:   - Hyperglycemic over last 36h.   - Glycemic Control: SSI    Hematologic:   - Monitor CBC daily  - Hb 10.1 from 10.2. No concerns for active bleeding.      Immunologic/Infectious Disease:  - Febrile, leukocytosis to 11.9  - Cultures:   - Ucx (2/26): Mixed GP/GN organisms    - RPP (2/27): negative   - Blood cultures (3/4): NGTD   - BAL (3/4): 2+ GNR  - Abx:    - Zosyn x4d after source control, extended in setting of infectious work up     Musculoskeletal:   NAI    Daily Care Checklist:   - Stress Ulcer Prevention: Yes: Mechanically ventilated  - DVT Prophylaxis: Chemical: SQ Heparin  - Daily Awakening: Yes  - Spontaneous Breathing Trial:  post-op  - Indication for Central/PICC Line: Yes  Infusions requiring central access, Hemodynamic monitoring, and Inadequate peripheral access, RIJ triple lumen  - Indication for Urinary Catheter: Yes  Strict intake and output, Agressive diuresis/hydration, and Urinary retention/obstruction  - Diagnostic images/reports of past 24hrs reviewed: Yes    Disposition:   - Continue ICU care  - PT/OT consulted:  will consult post extubation    Oliver Hum, MD   Surgical Intensive Care Unit  [place] [facility] [place] [facility]       SUBJECTIVE:      Intubated/sedated     OBJECTIVE:     Physical Exam:  Constitutional: Lying supine, NAD  Neurologic: Sedated   Respiratory: Mechanically ventilated via ETT   Cardiovascular: Tachycardic, irregular rhythm, MAPs > 65 on NE@7   Gastrointestinal: Mildly distended abdomen, midline incision dressing c/d/i. Urostomy with Foley in stoma draining clear yellow urine.   Musculoskeletal: Trace to no BLE edema.  Skin: Warm and dry     Temp:  [36.7 ??C (98.1 ??F)-37.5 ??C (99.5 ??F)] 36.7 ??C (98.1 ??F)  Heart Rate:  [100-136] 131  SpO2 Pulse:  [55-136] 131  Resp:  [16-20] 18  BP: (86-144)/(58-106) 104/78  MAP (mmHg):  [70-117] 84  A BP-1: (89-141)/(52-75) 99/59  MAP:  [66 mmHg-98 mmHg] 73 mmHg  FiO2 (%):  [40 %] 40 %  SpO2:  [95 %-100 %] 98 %       Recent Laboratory Results:  Recent Labs Units 03/04/24  0851   PHART  7.33*   PCO2ART mm Hg 33.3*   PO2ART mm Hg 137.0*   HCO3ART mmol/L 17*   BEART  -7.6*   O2SATART % 99.0     Recent Labs     Units 03/04/24  0340 03/04/24  0851 03/04/24  1140   NA mmol/L 144 143 - 143 145   K mmol/L 4.0  3.7 - 3.3* 4.1   CL mmol/L 108* 108* 111*   CO2 mmol/L 20.0 20.0 18.0*   BUN mg/dL 85* 83* 83*   CREATININE mg/dL 4.78* 2.95* 6.21*   GLU mg/dL 308* 657* 846*     Lab Results   Component Value Date    BILITOT 0.2 (L) 03/03/2024    BILITOT 0.5 02/26/2024    BILIDIR 0.10 03/03/2024    ALT 9 (L) 03/03/2024    ALT 12 03/01/2024    AST 22 03/03/2024    AST 12 03/01/2024    GGT 16 09/17/2011    GGT 354 (H) 05/03/2011    ALKPHOS 58 03/03/2024    ALKPHOS 101 02/26/2024    PROT 5.5 (L) 03/03/2024    PROT 9.2 (H) 02/26/2024    ALBUMIN 2.4 (L) 03/03/2024    ALBUMIN 4.5 02/26/2024     Recent Labs     Units 03/03/24  2207 03/04/24  0208 03/04/24  0508 03/04/24  1343   POCGLU mg/dL 962* 952* 841* 324*     Recent Labs     Units 03/03/24  1053 03/03/24  1544 03/04/24  0340   WBC 10*9/L 11.9* 13.9* 14.1*   RBC 10*12/L 3.75* 3.73* 3.54*   HGB g/dL 40.1* 02.7* 25.3*   HCT % 32.2* 32.2* 30.5*   MCV fL 85.9 86.5 86.1   MCH pg 27.9 27.4 28.4   MCHC g/dL 66.4 40.3* 47.4   RDW % 18.5* 18.2* 18.3*   PLT 10*9/L 227 233 198   MPV fL 8.3 8.8 8.8     No results for input(s): INR, APTT in the last 24 hours.    Invalid input(s): PTPATIENT   Lines & Tubes:   Patient Lines/Drains/Airways Status       Active Peripheral & Central Intravenous Access       Name Placement date Placement time Site Days    Peripheral IV 03/04/24 Anterior;Proximal;Right Forearm 03/04/24  0021  Forearm  less than 1    CVC Triple Lumen 02/28/24 Non-tunneled Right Internal jugular 02/28/24  1641  Internal jugular  4                     Patient Lines/Drains/Airways Status       Active Wounds       Name Placement date Placement time Site Days    Surgical Site 04/08/18 Shoulder Left 04/08/18  1338  -- 2157    Surgical Site 06/05/22 Eye Left 06/05/22  0928  -- 638    Surgical Site 06/19/22 Eye Right 06/19/22  1024  -- 624    Surgical Site 03/03/24 Abdomen 03/03/24  1013  -- 1    Wound 06/24/20 Other (comment) Buttocks inner right buttock 06/24/20  1010  Buttocks  1349                     Respiratory/ventilator settings for last 24 hours:   Vent Mode: PRVC  FiO2 (%): 40 %  S RR: 18  S VT: 330 mL  PEEP: 8 cm H20    Intake/Output last 3 shifts:  I/O last 3 completed shifts:  In: 3673.3 [I.V.:2586.9; NG/GT:100; IV Piggyback:642.3]  Out: 2225 [Urine:825; Emesis/NG output:60; Drains:835; Blood:5]    Daily/Recent Weight:  68.6 kg (151 lb 3.8 oz)    BMI:  Body mass index is 28.59 kg/m??.  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    Chronic kidney disease     Depression, psychotic (CMS-HCC)     Diabetes mellitus (CMS-HCC)     in past    Emphysema of lung (CMS-HCC)     Financial difficulties     Frail elderly     Hearing impairment     Hernia     History of transfusion     Hyperlipidemia     Hypertension     Impaired mobility     Osteoporosis     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/08/2015    Visual impairment      Past Surgical History:   Procedure Laterality Date    ABDOMINAL SURGERY      BLADDER SURGERY      BREAST CYST EXCISION      CHEMOTHERAPY  2012    bladder    GALLBLADDER SURGERY      stone removal    ILEOSTOMY  2012    PR COLONOSCOPY FLX DX W/COLLJ SPEC WHEN PFRMD N/A 02/09/2015    Procedure: COLONOSCOPY, FLEXIBLE, PROXIMAL TO SPLENIC FLEXURE; DIAGNOSTIC, W/WO COLLECTION SPECIMEN BY BRUSH OR WASH;  Surgeon: Dewaine Conger, MD;  Location: [facility] [place];  Service: Gastroenterology    PR EXPLORATORY OF ABDOMEN N/A 02/28/2024    Procedure: EXPLORATORY LAPAROTOMY, EXPLORATORY CELIOTOMY WITH OR WITHOUT BIOPSY(S);  Surgeon: Renda Rolls, MD;  Location: [facility];  Service: General Surgery    PR RECONSTR TOTAL SHOULDER IMPLANT Left 04/08/2018    Procedure: ARTHROPLASTY, GLENOHUMERAL JOINT; TOTAL SHOULDER(GLENOID & PROXIMAL HUMERAL REPLACEMENT(EG, TOTAL SHOULDER);  Surgeon: Tomasa Rand, MD;  Location: [facility] OR [facility];  Service: Ortho Sports Medicine    PR REOPEN RECENT ABD EXPLORATORY Midline 03/01/2024    Procedure: REOPENING OF RECENT LAPAROTOMY;  Surgeon: [name]., MD;  Location: OR [facility];  Service: Trauma    PR SIGMOIDOSCOPY,BIOPSY N/A 03/11/2015    Procedure: SIGMOIDOSCOPY, FLEXIBLE; WITH BIOPSY, SINGLE OR MULTIPLE;  Surgeon: Wilburt Finlay, MD;  [facility]: [facility];  Service: Gastroenterology    PR XCAPSL CTRC RMVL INSJ IO LENS PROSTH W/O ECP Left 06/05/2022    Procedure: EXTRACAPSULAR CATARACT REMOVAL W/INSERTION OF INTRAOCULAR LENS PROSTHESIS, MANUAL OR MECHANICAL TECHNIQUE WITHOUT ENDOSCOPIC CYCLOPHOTOCOAGULATION;  Surgeon: Garner Gavel, MD;  Location: [facility] OR [facility];  Service: Ophthalmology    PR XCAPSL CTRC RMVL INSJ IO LENS PROSTH W/O ECP Right 06/19/2022    Procedure: EXTRACAPSULAR CATARACT REMOVAL W/INSERTION OF INTRAOCULAR LENS PROSTHESIS, MANUAL OR MECHANICAL TECHNIQUE WITHOUT ENDOSCOPIC CYCLOPHOTOCOAGULATION;  Surgeon: Garner Gavel, MD;  Location: [facility] OR [facility];  Service: Ophthalmology     Scheduled Medications:   [Provider Hold] aspirin  81 mg Oral Daily    [Provider Hold] atorvastatin  40 mg Oral Daily    budesonide (PULMICORT) nebulizer solution  0.5 mg Nebulization BID (RT)    [Provider Hold] dilTIAZem  120 mg Oral Daily    flu vacc [id number](41yr up)-PF  0.5 mL Intramuscular During hospitalization    [START ON 03/14/2024] fluPHENAZine decanoate  50 mg Intramuscular Q30 Days    [Provider Hold] furosemide  20 mg Oral Daily    [Provider Hold] gabapentin  100 mg Oral TID  insulin NPH  6 Units Subcutaneous Once    insulin regular  0-20 Units Subcutaneous Q6H SCH    ipratropium  500 mcg Nebulization Q6H (RT)    pantoprazole (Protonix) intravenous solution  40 mg Intravenous Daily     Continuous Infusions:   amiodarone 0.5 mg/min (03/04/24 0600)    fentaNYL citrate (PF) 50 mcg/mL infusion 25 mcg/hr (03/04/24 0600)    heparin      NORepinephrine bitartrate-NS 7 mcg/min (03/04/24 1344)    Parenteral Nutrition (CENTRAL) Stopped (03/04/24 1344)    propofol 10 mg/mL infusion 5 mcg/kg/min (03/04/24 1430)     PRN Medications:  albuterol, [Provider Hold] cyclobenzaprine, dextrose in water, dextrose in water, glucagon, glucagon, glucose, heparin (porcine), HYDROmorphone **OR** HYDROmorphone, nicotine polacrilex **OR** nicotine polacrilex, ondansetron

## 2024-03-04 NOTE — Unmapped (Signed)
 Problem: Mechanical Ventilation Invasive  Goal: Optimal Device Function  Outcome: Ongoing - Unchanged  Note: Patient remains on PRVC. Suctioned for a moderate amount of thick yellow, tan secretions. All aerosol medications were administered as ordered. No skin breakdown is noted under ET tube. Respiratory needs met, airway patency maintained.

## 2024-03-04 NOTE — Unmapped (Signed)
 UROLOGY CONSULT NOTE    Requesting Attending Physician:  Merri Ray, MD  Service Requesting Consult:  Peter Garter [facility])  Service Providing Consult: SRU  Consulting Attending: Dr. Katrinka Blazing     Assessment:  Patient is a 83 y.o. female with hx of bladder cancer s/p RCIC on 10/05/2011 for T2N0M0 with known parastomal hernia (previously stable and reducible) who presented to [facility] with N/V and abdominal distention 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 Also with stable mild BL hydronephrosis with likely pre-renal AKI. Last loopogram negative for stricture or obstruction (11/08/22).     Patient was taken to OR on 2/28 for ex-lap LOA, small bowel resection fistula takedown with primary repair, left open. On takeback on 3/2, Urology interrogated stoma with no signs of leak. Catheter placed in stoma with return of several hundred ml's UOP. SRH was unable to close patient 2/2 persistent small bowel dilation and patient instability. She is now s/p abdominal wall closure with SRH on 3/4.     Interval 3/5: POD1 abdominal wall closure with SRH. Pt remains intubated, on NE at 7. Persistently tachycardic to 120s on amiodarone infusion. 615 ml UOP over past 24 hours. Cr this morning stable  at 3.28 from 3.27. Foley was noted to have fallen out of urostomy; it was replaced with several ml water in balloon.     Recommendations:  Continue foley catheter per stoma, if it falls out, please notify Urology to replace  Continue to trend Cr  3/3 FeNa consistent with pre-renal etiology of AKI in the setting of acute illness. If Cr continues to worsen, can consider obtaining renal US to assess for dilation with catheter in stoma, however would not obtain additional imaging at this time    Thank you for this consult. Please page [phone] with any questions or concerns.    History of Present Illness:   Tammy Braun is seen in consultation for SBO at the request of Merri Ray, MD on the [facility]).     Endorses 1 day history of worsening abdominal pain associated with N/V. Feels constipation and has not had BM in at least a day. Minimal PO intake.     Denies change in urine output. Does not feel like her urine has been malodourous or has not noted decreased output from ostomy.     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    Chronic kidney disease     Depression, psychotic (CMS-HCC)     Diabetes mellitus (CMS-HCC)     in past    Emphysema of lung (CMS-HCC)     Financial difficulties     Frail elderly     Hearing impairment     Hernia     History of transfusion     Hyperlipidemia     Hypertension     Impaired mobility     Osteoporosis     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/08/2015    Visual impairment        Past Surgical History:   Past Surgical History:   Procedure Laterality Date    ABDOMINAL SURGERY      BLADDER SURGERY      BREAST CYST EXCISION      CHEMOTHERAPY  2012    bladder    GALLBLADDER SURGERY      stone removal    ILEOSTOMY  2012    PR COLONOSCOPY FLX DX W/COLLJ SPEC WHEN PFRMD N/A 02/09/2015    Procedure: COLONOSCOPY, FLEXIBLE, PROXIMAL TO SPLENIC FLEXURE; DIAGNOSTIC, W/WO COLLECTION SPECIMEN BY BRUSH OR WASH;  Surgeon: Dewaine Conger, MD;  Location: [facility] PROCEDURES [facility];  Service: Gastroenterology    PR EXPLORATORY OF ABDOMEN N/A 02/28/2024    Procedure: EXPLORATORY LAPAROTOMY, EXPLORATORY CELIOTOMY WITH OR WITHOUT BIOPSY(S);  Surgeon: Renda Rolls, MD;  Location: [facility] OR [facility];  Service: General Surgery    PR RECONSTR TOTAL SHOULDER IMPLANT Left 04/08/2018    Procedure: ARTHROPLASTY, GLENOHUMERAL JOINT; TOTAL SHOULDER(GLENOID & PROXIMAL HUMERAL REPLACEMENT(EG, TOTAL SHOULDER);  Surgeon: Tomasa Rand, MD;  Location: [facility] OR [facility];  Service: Ortho Sports Medicine    PR REOPEN RECENT ABD EXPLORATORY Midline 03/01/2024 Procedure: REOPENING OF RECENT LAPAROTOMY;  Surgeon: [name]., MD;  Location: OR [facility];  Service: Trauma    PR SIGMOIDOSCOPY,BIOPSY N/A 03/11/2015    Procedure: SIGMOIDOSCOPY, FLEXIBLE; WITH BIOPSY, SINGLE OR MULTIPLE;  Surgeon: Wilburt Finlay, MD;  [facility]: [facility];  Service: Gastroenterology    PR XCAPSL CTRC RMVL INSJ IO LENS PROSTH W/O ECP Left 06/05/2022    Procedure: EXTRACAPSULAR CATARACT REMOVAL W/INSERTION OF INTRAOCULAR LENS PROSTHESIS, MANUAL OR MECHANICAL TECHNIQUE WITHOUT ENDOSCOPIC CYCLOPHOTOCOAGULATION;  Surgeon: Garner Gavel, MD;  Location: [facility] OR [facility];  Service: Ophthalmology    PR XCAPSL CTRC RMVL INSJ IO LENS PROSTH W/O ECP Right 06/19/2022    Procedure: EXTRACAPSULAR CATARACT REMOVAL W/INSERTION OF INTRAOCULAR LENS PROSTHESIS, MANUAL OR MECHANICAL TECHNIQUE WITHOUT ENDOSCOPIC CYCLOPHOTOCOAGULATION;  Surgeon: Garner Gavel, MD;  Location: [facility] OR [facility];  Service: Ophthalmology       Medication:  Current Facility-Administered Medications   Medication Dose Route Frequency Provider Last Rate Last Admin    albuterol 2.5 mg /3 mL (0.083 %) nebulizer solution 2.5 mg  2.5 mg Nebulization Q6H PRN Oliver Hum, MD   2.5 mg at 02/28/24 1322    amiodarone 360 mg in dextrose 5% 200 mL (1.8 mg/mL) infusion PMB  0.5 mg/min Intravenous Continuous Lari, Kian B, MD 16.7 mL/hr at 03/04/24 0600 0.5 mg/min at 03/04/24 0600    [Provider Hold] aspirin chewable tablet 81 mg  81 mg Oral Daily Oletha Blend B, MD   81 mg at 02/28/24 0912    [Provider Hold] atorvastatin (LIPITOR) tablet 40 mg  40 mg Oral Daily Oliver Hum, MD   40 mg at 02/28/24 0912    budesonide (PULMICORT) nebulizer solution 0.5 mg  0.5 mg Nebulization BID (RT) Oliver Hum, MD   0.5 mg at 03/03/24 2058    [Provider Hold] cyclobenzaprine (FLEXERIL) tablet 5 mg  5 mg Oral BID PRN Oletha Blend B, MD        dextrose (D10W) 10% bolus 125 mL  12.5 g Intravenous Q10 Min PRN Oliver Hum, MD [Provider Hold] dilTIAZem (CARDIZEM CD) 24 hr capsule 120 mg  120 mg Oral Daily Festus Holts, Kian B, MD   120 mg at 02/28/24 0912    fentaNYL PF (SUBLIMAZE) (50 mcg/mL) infusion (bag)  0-200 mcg/hr Intravenous Continuous Wyonia Hough, MD 0.5 mL/hr at 03/04/24 0600 25 mcg/hr at 03/04/24 0600    flu vaccine [id number] (77YR UP)(PF)(FLUZONE HIGH DOSE)  0.5 mL Intramuscular During hospitalization Oliver Hum, MD        [START ON 03/14/2024] fluPHENAZine decanoate (  PROLIXIN) injection 50 mg  50 mg Intramuscular Q30 Days Oliver Hum, MD        [Provider Hold] furosemide (LASIX) tablet 20 mg  20 mg Oral Daily Alexander Mt, MD        [Provider Hold] gabapentin (NEURONTIN) capsule 100 mg  100 mg Oral TID Oletha Blend B, MD        glucagon injection 1 mg  1 mg Intramuscular Once PRN Oliver Hum, MD        heparin (porcine) 5,000 unit/mL injection 5,000 Units  5,000 Units Subcutaneous Q8H [facility] Oliver Hum, MD   5,000 Units at 03/04/24 0505    HYDROmorphone (PF) (DILAUDID) injection Syrg 0.5 mg  0.5 mg Intravenous Q4H PRN Oliver Hum, MD        Or    HYDROmorphone (DILAUDID) injection 1 mg  1 mg Intravenous Q4H PRN Oliver Hum, MD        insulin lispro (HumaLOG) injection 0-20 Units  0-20 Units Subcutaneous Q4H [facility] Oliver Hum, MD   1 Units at 03/04/24 0214    ipratropium (ATROVENT) 0.02 % nebulizer solution 500 mcg  500 mcg Nebulization Q6H (RT) Oliver Hum, MD   500 mcg at 03/03/24 2100    nicotine polacrilex (NICORETTE) gum 4 mg  4 mg Buccal Q1H PRN Oliver Hum, MD        Or    nicotine polacrilex (NICORETTE) lozenge 4 mg  4 mg Buccal Q1H PRN Oliver Hum, MD        NORepinephrine 8 mg in dextrose 5 % 250 mL (32 mcg/mL) infusion PMB  0-15 mcg/min Intravenous Continuous Rigoberto Noel, MD 15 mL/hr at 03/04/24 0645 8 mcg/min at 03/04/24 0645    ondansetron (ZOFRAN) injection 4 mg  4 mg Intravenous Q6H PRN Oliver Hum, MD        pantoprazole (Protonix) injection 40 mg  40 mg Intravenous Daily Oliver Hum, MD   40 mg at 03/03/24 1108    Parenteral Nutrition (CENTRAL)   Intravenous Continuous Ra, Vevelyn Francois, MD 40 mL/hr at 03/04/24 0600 Rate Verify at 03/04/24 0600    propofol (DIPRIVAN) infusion 10 mg/mL  0-50 mcg/kg/min Intravenous Continuous Oliver Hum, MD 14.7 mL/hr at 03/04/24 0600 40 mcg/kg/min at 03/04/24 0600     Facility-Administered Medications Ordered in Other Encounters   Medication Dose Route Frequency Provider Last Rate Last Admin    EPINEPHrine 100 mcg/10 mL (10 mcg/mL) injection   Intravenous PRN (once a day) Ichite, Precious C, MD   10 mcg at 02/28/24 1511    Propofol (DIPRIVAN) injection   Intravenous PRN (once a day) Ichite, Precious C, MD   60 mg at 02/28/24 1507    succinylcholine (ANECTINE) injection   Intravenous PRN (once a day) Ichite, Precious C, MD   60 mg at 02/28/24 1507       Allergies:  Allergies   Allergen Reactions    Lisinopril Anaphylaxis and Swelling    Losartan Dizziness    Hctz [Hydrochlorothiazide]      SIADH       Social History:  Social History     Tobacco Use    Smoking status: Every Day     Current packs/day: 1.00     Average packs/day: 1 pack/day for 65.9 years (65.9 ttl pk-yrs)     Types: Cigarettes     Start date: 04/09/1958    Smokeless tobacco: Never    Tobacco comments:     15cpd   Vaping Use    Vaping  status: Never Used   Substance Use Topics    Alcohol use: No     Alcohol/week: 0.0 standard drinks of alcohol    Drug use: No       Family History:  Family History   Problem Relation Age of Onset    Breast cancer Daughter 75    Diabetes Mother     Glaucoma Father     Colon cancer Neg Hx     Ovarian cancer Neg Hx     Endometrial cancer Neg Hx     Anesthesia problems Neg Hx     Bleeding Disorder Neg Hx        Review of Systems:  10 systems were reviewed and are negative except as noted specifically in the HPI.    Objective:     Intake/Output last 3 shifts:  I/O last 3 completed shifts:  In: 3673.3 [I.V.:2586.9; NG/GT:100; IV Piggyback:642.3]  Out: 2225 [Urine:825; Emesis/NG output:60; Drains:835; Blood:5]  Vital signs in last 24 hours:  BP 90/66  - Pulse 120  - Temp 37.3 ??C (99.1 ??F) (Axillary)  - Resp 19  - Ht 154.9 cm (5' 0.98)  - Wt 68.6 kg (151 lb 3.8 oz)  - SpO2 97%  - BMI 28.59 kg/m??     Physical Exam:  General:  Critically ill appearing  HEENT: ETT in place, NGT in place  Neck  Trachea midline, symmetrical  Lungs:   Ventilated on PSV  Cardiac: Tachycardic, in afib  Abdomen: Distended, abthera in place,  GU:   Ileal conduit, stoma is pink, foley in place, straw colored urine in ostomy bag  Extremities: Warm and well perfused  Neuro:             intubated, sedated    Most Recent Labs:  Recent Labs     Units 03/03/24  1053 03/03/24  1544 03/04/24  0340   WBC 10*9/L 11.9* 13.9* 14.1*   RBC 10*12/L 3.75* 3.73* 3.54*   HGB g/dL 16.1* 09.6* 04.5*   HCT % 32.2* 32.2* 30.5*   MCV fL 85.9 86.5 86.1   MCH pg 27.9 27.4 28.4   MCHC g/dL 40.9 81.1* 91.4   RDW % 18.5* 18.2* 18.3*   PLT 10*9/L 227 233 198   MPV fL 8.3 8.8 8.8     Recent Labs     Units 03/03/24  1053 03/03/24  1544 03/03/24  1815 03/03/24  2149 03/04/24  0340   NA mmol/L 146* 148* 147* - 146* 147* 144   K mmol/L 4.3 4.1 4.0 - 3.9 4.2 4.0   CL mmol/L 111* 111* 111* 109* 108*   CO2 mmol/L 20.0 20.0 20.0 20.0 20.0   BUN mg/dL 80* 80* 81* 80* 85*   CREATININE mg/dL 7.82* 9.56* 2.13* 0.86* 3.28*   GLU mg/dL 578* 469* 629* 528* 413*   MG mg/dL 3.0* 2.9*  --   --  2.8*     Recent Labs     Units 02/26/24  1914 03/01/24  0335 03/03/24  2149   ALT U/L 20 12 9*   AST U/L 31 12 22    ALKPHOS U/L 101  --  58   ALBUMIN g/dL 4.5  --  2.4*   PROT g/dL 9.2*  --  5.5*   BILITOT mg/dL 0.5  --  0.2*   BILIDIR mg/dL  --   --  2.44     No results for input(s): INR, APTT in the last 168 hours.    Microbiology Data:  No results found for: LABBLOO  Urine Culture, Comprehensive   Date Value Ref Range Status   02/26/2024 (A)  Final    Mixed Gram Positive/Gram Negative Organisms Isolated   05/10/2022 >100,000 CFU/mL Escherichia coli (A) Final   05/10/2022 50,000 to 100,000 CFU/mL Pseudomonas aeruginosa (A)  Final     Comment:     Susceptibility Testing By Consultation Only   05/10/2022 50,000 to 100,000 CFU/mL Mixed Urogenital Flora (A)  Final   06/22/2020 (A)  Final    Mixed Gram Positive/Gram Negative Organisms Isolated   02/16/2020 (A)  Final    Mixed Gram Positive/Gram Negative Organisms Isolated   11/26/2016 >100,000 CFU/mL Klebsiella pneumoniae (A)  Final   11/26/2016 10,000 to 50,000 CFU/mL Pseudomonas aeruginosa (A)  Final     Comment:     Susceptibility Testing By Consultation Only   08/02/2013 (A)  Final    30,000 CFU/ML MIXED UROGENITAL FLORA  Reference Range: (Lower detectable limit 1000 cfu/ml)  (Lower detectable limit for Acute dysuria, Suprapubic tap,  and Post-prostatic massage specimen types = 100 cfu/ml)   08/02/2013   Final    >100,000 CFU/mL  Testing results predict SUSCEPTIBILITY for the oral agents  cefdinir, cefuroxime, and cephalexin when used for therapy of  uncomplicated UTIs due to this organism.   07/07/2013   Final    MIXED GRAM POSITIVE/NEGATIVE ORGANISMS ISOLATED  Reference Range: (Lower detectable limit 1000 cfu/ml)  (Lower detectable limit for Acute dysuria, Suprapubic tap,  and Post-prostatic massage specimen types = 100 cfu/ml)   09/17/2011 FINAL  MIXED UROGENITAL FLORA  Final     Comment:     Reference Range: (Lower detectable limit 1000 cfu/ml)  (Lower detectable limit for Acute dysuria, Suprapubic tap,  and Post-prostatic massage specimen types = 100 cfu/ml)   03/23/2011 FINAL  MIXED UROGENITAL FLORA  Final     Comment:     Reference Range: (Lower detectable limit 1000 cfu/ml)  (Lower detectable limit for Acute dysuria, Suprapubic tap,  and Post-prostatic massage specimen types = 100 cfu/ml)   03/18/2011 FINAL  NO GROWTH  Final     Comment:     Reference Range: (Lower detectable limit 1000 cfu/ml)  (Lower detectable limit for Acute dysuria, Suprapubic tap,  and Post-prostatic massage specimen types = 100 cfu/ml)     No results found for: ANACX  Most recent Urinalysis:  Recent Labs     Units 02/26/24  2313   LEUKOCYTESUR  Moderate*   NITRITE  Negative   RBCUA /HPF 42*   WBCUA /HPF 71*   SQUEPIU /HPF 3   BACTERIA /HPF Moderate*      Urinalysis History:  Leukocyte Esterase, UA   Date Value Ref Range Status   02/26/2024 Moderate (A) Negative Final   05/10/2022 Large (A) Negative Final   06/22/2020 Moderate (A) Negative Final   02/16/2020 Moderate (A) Negative Final   11/26/2016 Moderate (A) Negative Final   08/02/2013 +1 NEGATIVE Final   07/07/2013 +2 NEGATIVE Final   09/17/2011 +2 NEGATIVE Final   03/18/2011 NEGATIVE NEGATIVE Final     Nitrite, UA   Date Value Ref Range Status   02/26/2024 Negative Negative Final   05/10/2022 Positive (A) Negative Final   06/22/2020 Negative Negative Final   02/16/2020 Positive (A) Negative Final   11/26/2016 Negative Negative Final   08/02/2013 NEGATIVE NEGATIVE Final   07/07/2013 NEGATIVE NEGATIVE Final   09/17/2011 NEGATIVE NEGATIVE Final   03/18/2011 NEGATIVE NEGATIVE Final     RBC, UA  Date Value Ref Range Status   02/26/2024 42 (H) <=4 /HPF Final   05/10/2022 4 <=4 /HPF Final   06/22/2020 2 <=4 /HPF Final   02/16/2020 3 <4 /HPF Final   11/26/2016 21 (H) <4 /HPF Final     WBC, UA   Date Value Ref Range Status   02/26/2024 71 (H) 0 - 5 /HPF Final   05/10/2022 6 (H) 0 - 5 /HPF Final   06/22/2020 25 (H) 0 - 5 /HPF Final   02/16/2020 10 (H) 0 - 5 /HPF Final   11/26/2016 12 (H) 0 - 5 /HPF Final     Squam Epithel, UA   Date Value Ref Range Status   02/26/2024 3 0 - 5 /HPF Final   05/10/2022 1 0 - 5 /HPF Final   06/22/2020 <1 0 - 5 /HPF Final   02/16/2020 2 0 - 5 /HPF Final   11/26/2016 <1 0 - 5 /HPF Final   09/17/2011 3 /HPF Final   03/18/2011 <1 /HPF Final     Bacteria, UA   Date Value Ref Range Status   02/26/2024 Moderate (A) None Seen /HPF Final   05/10/2022 Few (A) None Seen /HPF Final   06/22/2020 Many (A) None Seen /HPF Final   02/16/2020 Few (A) None Seen /HPF Final 11/26/2016 Many (A) None Seen /HPF Final   08/02/2013 FEW  Final   07/07/2013 MANY  Final   03/18/2011 RARE  Final        Imaging:  XR Chest Portable  Result Date: 03/04/2024  EXAM: XR CHEST PORTABLE ACCESSION: [id number] UN REPORT DATE: 03/04/2024 8:27 AM     CLINICAL INDICATION: VENTILATOR/RESPIRATOR DEPENDENCE STATUS      TECHNIQUE: Single View AP Chest Radiograph.     COMPARISON: 03/02/2024, XR CHEST PORTABLE FINDINGS:     Unchanged support devices.     Unchanged bibasilar opacities. Upper lung predominant emphysematous changes. No pleural effusion or pneumothorax.     Unchanged cardiomediastinal silhouette which is partially obscured on the right lung base. Very large hiatal hernia.         Bibasilar opacities concerning for atelectasis with superimposed aspiration or pneumonia.    ECG 12 Lead  Result Date: 03/04/2024  ATRIAL FIBRILLATION WITH RAPID VENTRICULAR RESPONSE LOW VOLTAGE QRS SEPTAL INFARCT  (CITED ON OR BEFORE 21-Sep-2020) T WAVE ABNORMALITY, CONSIDER LATERAL ISCHEMIA ABNORMAL ECG WHEN COMPARED WITH ECG OF 03-Mar-2024 18:22, NO SIGNIFICANT CHANGE WAS FOUND    CTA Chest W Contrast  Result Date: 03/04/2024  EXAM: CTA CHEST W CONTRAST ACCESSION: [id number] UN REPORT DATE: 03/03/2024 11:02 PM     CLINICAL INDICATION: echo w/c/f Pulm emb     TECHNIQUE: Contiguous axial images were reconstructed through the chest following a single breath hold helical acquisition during the administration of intravenous contrast material. Images were reformatted in the coronal and sagittal planes. MIP slabs were also constructed.     COMPARISON: Same day prior chest radiograph and prior studies     FINDINGS:     PULMONARY ARTERIES: Filling defects are visualized in the distal left anterior segmental artery (4:158). Main pulmonary artery is enlarged, measuring 3.8 cm.     HEART AND VASCULATURE: Heart is enlarged. Dilated right ventricle. Moderate coronary artery calcifications. No pericardial effusion. Aorta is normal in caliber. LUNGS, AIRWAYS, AND PLEURA: Endotracheal tube tip terminating in the midthoracic trachea. Biapical predominant centrilobular and subpleural emphysema. Compressive atelectasis of the right greater than left lower lobes. Small right and trace left pleural effusions. No pneumothorax. Bibasilar  atelectasis.     MEDIASTINUM AND LYMPH NODES: No enlarged intrathoracic, axillary, or supraclavicular lymph nodes. 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     CHEST WALL AND BONES: No chest wall abnormalities. Mild degenerative changes of the thoracic spine.     UPPER ABDOMEN: Enteric tube tip terminating in the gastric fundus.     OTHER: Right internal jugular port catheter tip is at the level of the superior cavoatrial junction.             1.  Filling defects visualized in the distal left anterior segmental artery are favored to represent acute pulmonary embolus. 2.  Enlarged right ventricle and dilated main pulmonary artery, favored chronic in the setting of pulmonary arterial hypertension. There may be some aspect of right heart strain due to acute pulmonary embolus 3.  Redemonstrated large Bochdalek hernia extending into the right greater than left hemithorax with compressive bibasilar atelectasis. 4.  Small right and trace left pleural effusions.     The findings of this study were discussed via telephone with DR. NARAYAN RAGHAVA by Dr. Laurice Record on 03/03/2024 11:59 PM.    ECG 12 Lead  Result Date: 03/03/2024  ATRIAL FIBRILLATION WITH RAPID VENTRICULAR RESPONSE LOW VOLTAGE QRS SEPTAL INFARCT  (CITED ON OR BEFORE 21-Sep-2020) T WAVE ABNORMALITY, CONSIDER LATERAL ISCHEMIA ABNORMAL ECG WHEN COMPARED WITH ECG OF 28-Feb-2024 13:16, T WAVE INVERSION NOW EVIDENT IN ANTERIOR LEADS    Echocardiogram Follow Up/Limited Echo  Result Date: 03/03/2024  Patient Info Name:     Lachele Lievanos Age:     82 years DOB:     11-Dec-1941 Gender:     Female MRN:     [id number] Accession #:     [id number] [facility] Account #:     [id number] Ht:     155 cm Wt:     69 kg BSA:     1.74 m2 BP:     99 /     66 mmHg Exam Date:     03/03/2024 2:01 PM Admit Date:     02/26/2024     Exam Type:     ECHOCARDIOGRAM FOLLOW UP/LIMITED ECHO     Technical Quality:     Fair     Staff Sonographer:     Bryson Dames Reading Fellow:     Dian Situ Pistiolis     Study Info Indications      - post operative follow up TTE Procedure(s)   Limited 2D, color flow and Doppler transthoracic echocardiogram is performed.         Summary   1. The left ventricular systolic function is normal, LVEF is visually estimated at 65-70%.   2. Mitral annular calcification is present (moderate).   3. The right ventricle is moderately dilated in size, with moderately reduced systolic function.   4. Possible McConnell sign is noted- see details below.   5. There is massive tricuspid regurgitation.   6. There is mild-moderate pulmonary hypertension.   7. TR maximum velocity: 3.2 m/s  Estimated PASP: 49 mmHg.   8. The right atrium is moderately dilated in size.         Left Ventricle   The left ventricular systolic function is normal, LVEF is visually estimated at 65-70%. The left ventricle is normal in size with upper normal wall thickness. Left ventricular diastolic function cannot be  accurately assessed.     Right Ventricle   The right ventricle is moderately dilated in size, with moderately reduced systolic function. McConnell sign is noted- hypokinesia of right ventricular free wall with sparing of the apex (can be seen in acute pulmonary embolism)- clinical correlation is reocmmendded.         Left Atrium   The left atrium is relatively small in size.     Right Atrium   The right atrium is moderately dilated in size.         Aortic Valve   The aortic valve is not well visualized.     Mitral Valve   The mitral valve leaflets are mildly thickened with normal leaflet mobility. Mitral annular calcification is present (moderate).     Tricuspid Valve   There is massive tricuspid regurgitation. The tricuspid valve leaflets are mildly thickened, with normal leaflet mobility. There is mild-moderate pulmonary hypertension. TR maximum velocity: 3.2 m/s  Estimated PASP: 49 mmHg.         Aorta   The aorta is not well visualized.     Inferior Vena Cava   Mechanical ventilation precludes the ability to accurately assess right atrial pressure.     Pericardium/Pleural   There is no pericardial effusion.     Other Findings   Rhythm: Tachycardia.         Ventricles ---------------------------------------------------------------------- Name                                 Value        Normal ----------------------------------------------------------------------     LV Dimensions 2D/MM ----------------------------------------------------------------------  IVS Diastolic Thickness (2D)                                1.1 cm       0.6-0.9 LVID Diastole (2D)                  3.4 cm       3.8-5.2  LVPW Diastolic Thickness (2D)                                1.1 cm       0.6-0.9 LVID Systole (2D)                   2.7 cm       2.2-3.5 LV Mass Index (2D Cubed)           66 g/m2         43-95  Relative Wall Thickness (2D)                                  0.65        <=0.42     RV Dimensions 2D/MM ----------------------------------------------------------------------  RV Basal Diastolic Dimension                           3.3 cm       2.5-4.1 TAPSE                               1.0 cm         >=1.7  Tricuspid Valve ---------------------------------------------------------------------- Name                                 Value        Normal ----------------------------------------------------------------------     TV Regurgitation Doppler ---------------------------------------------------------------------- TR Peak Velocity                   3.2 m/s                   Estimated PAP/RSVP ---------------------------------------------------------------------- RA Pressure                         8 mmHg           <=5 RV Systolic Pressure               49 mmHg           <36         Report Signatures Finalized by Rudene Anda  MD on 03/03/2024 04:16 PM Resident Serafim M Pistiolis on 03/03/2024 03:57 PM    XR Chest Portable  Result Date: 03/03/2024  EXAM: XR CHEST PORTABLE ACCESSION: [id number] UN REPORT DATE: 03/03/2024 3:48 PM     CLINICAL INDICATION: NGT (CATHETER VASCULAR FIT & ADJ)      TECHNIQUE: Single View AP Chest Radiograph.     COMPARISON: Chest 03/03/2024 at 1:03 p.m.     FINDINGS:     Unchanged support devices.     Large hiatal hernia with associated bilateral lower lung atelectasis/consolidation, right greater than left. No pneumothorax. Small bilateral pleural effusions.     Normal heart size and mediastinal contours.             Large hiatal hernia with associated bilateral lower lung atelectasis/consolidation, right greater than left. Small bilateral pleural effusions.    XR Chest Portable  Result Date: 03/03/2024  EXAM: XR CHEST PORTABLE ACCESSION: [id number] UN REPORT DATE: 03/03/2024 1:47 PM     CLINICAL INDICATION: VENTILATOR/RESPIRATOR DEPENDENCE STATUS      TECHNIQUE: Single View AP Chest Radiograph.     COMPARISON: Chest 03/03/2024     FINDINGS:     Unchanged support devices.     Large hiatal hernia with associated bilateral lower lung atelectasis/consolidation, right greater than left. No pneumothorax. Small bilateral pleural effusions.     Normal heart size and mediastinal contours.             Large hiatal hernia with associated bilateral lower lung atelectasis/consolidation, right greater than left. Small bilateral pleural effusions.    PVL Venous Duplex Lower Extremity Bilateral  Result Date: 03/03/2024   Peripheral Vascular Lab     [address]   [place], [place] [address]  PVL VENOUS DUPLEX LOWER EXTREMITY BILATERAL Patient Demographics Pt. Name: AKIVA JOSEY Location: PVL Inpatient Bedside MRN:      [id number]          Sex:      F DOB:      07/06/1941        Age:      10 years  Study Information Authorizing         [id number] Tandy Gaw        Performed Time       03/02/2024 Provider Name       LAR[place]EE  11:00:39 PM Ordering Physician  Dyanne Iha       Patient Location     [facility] Accession Number    [id number] UN         Technologist         Neita Garnet Diagnosis:                                 Glass blower/designer Ordered Reason For Exam: fever, tachy Indication: as noted above per ordering provider Risk Factors: Immobility and Cancer (h/o bladder cancer). Anticoagulation: (Heparin). Protocol The major deep veins from the inguinal ligament to the ankle are assessed for bilaterally for compressibility and color and spectral Doppler flow characteristics. The assessed veins include bilateral common femoral vein, femoral vein in the thigh, popliteal vein, and intramuscular calf veins. The iliac vein is assessed indirectly using Doppler waveform analysis. The great saphenous vein is assessed for compressibility at the saphenofemoral junction, and the small saphenous vein assessed for compressibility behind the knee.  Final Interpretation Right There is no evidence of DVT in the lower extremity. Left There is no evidence of DVT in the lower extremity.  Electronically signed by [id number] Tobi Bastos on 03/03/2024 at 1:23:20 PM.  -------------------------------------------------------------------------------- Right Duplex Findings All veins visualized appear fully compressible. Doppler flow signals demonstrate normal spontaneity, phasicity, and augmentation.  Left Duplex Findings All veins visualized appear fully compressible. Doppler flow signals demonstrate normal spontaneity, phasicity, and augmentation. Right Technical Summary No evidence of deep venous obstruction in the lower extremity. No indirect evidence of obstruction proximal to the inguinal ligament. Left Technical Summary No evidence of deep venous obstruction in the lower extremity. No indirect evidence of obstruction proximal to the inguinal ligament.  Final     PVL Venous Duplex Upper Extremity Bilateral  Result Date: 03/03/2024   Peripheral Vascular Lab     [address]   [place], [place] [address]  PVL VENOUS DUPLEX UPPER EXTREMITY BILATERAL Patient Demographics Pt. Name: KIMELA MALSTROM Location: PVL Inpatient Bedside MRN:      [id number]          Sex:      F DOB:      11/28/1941        Age:      4 years  Study Information Authorizing         [id number] Tandy Gaw        Performed Time       03/02/2024 Provider Name       LARABEE                                     10:40:58 PM Ordering Physician  Dyanne Iha       Patient Location     [facility] Accession Number    [id number] [facility]         Technologist         Neita Garnet Diagnosis:  Assisting                                            Technologist Ordered Reason For Exam: fever, tachy Other Indication: as noted above per ordering provider Risk Factors: Immobility and Cancer (h/o bladder cancer). Anticoagulation: (Heparin).  Final Interpretation Right No evidence of DVT detected in the central veins. Evidence of acute superficial venous obstruction in the basilic vein. This was a limited study. Left No evidence of DVT detected in the central veins or arm veins.  Electronically signed by [id number] Tobi Bastos on 03/03/2024 at 1:23:01 PM.  Examination Protocol The internal jugular, brachiocephalic, subclavian, and axillary, brachial, basilic and cephalic veins are routinely assessed on the requested limb. Spectral and Color Doppler data is the primary method for evaluating the brachiocephalic and subclavian veins. Venous compression is used to evaluate the internal jugular, axillary, and upper arm veins. If a unilateral exam is requested, a contralateral subclavian vein Doppler signal is required for comparison.  Limitations: Right CVC.  Duplex Findings Right No abnormality of venous architecture is observed in the central veins. All Doppler signals are appropriately pulsatile with ventilatory excursions. Color Doppler notes appropriate filling in all vessels. However, this examination was unable to visualize the internal jugular, brachiocephalic, and subclavian veins due to noted limitations. The basilic vein is partially compressible appearing softly echogenic. All other venous findings in the upper extremity are within normal limits. Left No abnormality of venous architecture is observed in the central veins. All Doppler signals are appropriately pulsatile with ventilatory excursions. Color Doppler notes appropriate filling in all vessels. The arm veins appear fully compressible.  Summary of Findings Right No evidence of obstruction was seen in the central veins. However, unable to visualize the internal jugular, brachiocephalic, and subclavian veins due to noted limitations. Evidence of acute obstruction is visualized in the basilic vein. All other venous findings in the upper extremity are within normal limits. Left No evidence of obstruction was seen in the central veins or arm veins.   Final     XR Abdomen Portable  Result Date: 03/03/2024  EXAM: XR ABDOMEN PORTABLE ACCESSION: [id number] UN REPORT DATE: 03/03/2024 9:44 AM     CLINICAL INDICATION: 83 years old with URFO      COMPARISON: 03/01/2024 abdominal radiograph and prior     TECHNIQUE: Supine view of the abdomen, 2 image(s)     FINDINGS: Slight interval advancement of the esophagogastric tube with the sideport projecting over the lower right lung compatible with known large hiatal hernia. Surgical clips project over the right upper quadrant, midline lower abdomen and pelvis. There is suture material within the right lower pelvis. There are 2 abdominal drains with 1 terminating within the right lower quadrant and the other within the pelvis coursing superiorly towards the central abdomen. Nonobstructive bowel gas pattern. Bibasilar atelectasis with scattered airspace opacities visualized right mid and lower lung. No acute osseous abnormality.         Interval exploratory laparotomy with abdominal closure and placement of 2 abdominal drains with positioning as described above. No unexpected radiopaque foreign body within the abdomen or pelvis.     ++++++++++++++++++++     The findings of this study were discussed via telephone with DR. Abelino Derrick RAFF by Dr. Marisa Severin on 03/03/2024 9:44 AM.     -----------------------------------------------  XR Chest Portable  Result Date: 03/02/2024  EXAM: XR CHEST PORTABLE ACCESSION: [id number] UN REPORT DATE: 03/02/2024 11:01 AM     CLINICAL INDICATION: VENTILATOR/RESPIRATOR DEPENDENCE STATUS      TECHNIQUE: Single View AP Chest Radiograph.     COMPARISON: 03/01/2024     FINDINGS:     Unchanged support devices.     Large hiatal hernia with associated bilateral lower lung atelectasis. No pneumothorax. Cannot exclude small pleural effusions.     Normal heart size and mediastinal contours.             Large hiatal hernia with associated bilateral lower lung atelectasis.

## 2024-03-04 NOTE — Unmapped (Signed)
 Neuro: UTA orientation status. Prop and Fent infusing. No noxious stimuli per order. Remains intubated with ETT at 40% FIO2, saturation maintaining. Afib. Amio gtt infusing. Levo titrated to maintain MAP > 65. Afebrile. NPO. NGT to LIWS. No BM at this time.Urostomy in place; minimal UOP. JP drains intact. Repositioned every 2 hours.Safety maintained.    Problem: Adult Inpatient Plan of Care  Goal: Plan of Care Review  Outcome: Ongoing - Unchanged  Goal: Patient-Specific Goal (Individualized)  Outcome: Ongoing - Unchanged  Goal: Absence of Hospital-Acquired Illness or Injury  Outcome: Ongoing - Unchanged  Intervention: Identify and Manage Fall Risk  Recent Flowsheet Documentation  Taken 03/04/2024 0400 by Kerney Elbe, RN  Safety Interventions:   aspiration precautions   bed alarm   bleeding precautions   environmental modification   fall reduction program maintained   family at bedside   lighting adjusted for tasks/safety   low bed  Taken 03/04/2024 0000 by Kerney Elbe, RN  Safety Interventions:   aspiration precautions   environmental modification   fall reduction program maintained   family at bedside   lighting adjusted for tasks/safety   low bed  Taken 03/03/2024 2000 by Kerney Elbe, RN  Safety Interventions:   aspiration precautions   enteral feeding safety   environmental modification   fall reduction program maintained   family at bedside   lighting adjusted for tasks/safety   low bed  Intervention: Prevent Skin Injury  Recent Flowsheet Documentation  Taken 03/04/2024 0400 by Kerney Elbe, RN  Positioning for Skin: Right  Device Skin Pressure Protection:   absorbent pad utilized/changed   adhesive use limited   tubing/devices free from skin contact  Skin Protection:   tubing/devices free from skin contact   silicone foam dressing in place   incontinence pads utilized   adhesive use limited  Taken 03/04/2024 0200 by Kerney Elbe, RN  Positioning for Skin: Left  Taken 03/04/2024 0000 by Kerney Elbe, RN  Positioning for Skin: Right  Device Skin Pressure Protection:   absorbent pad utilized/changed   adhesive use limited   tubing/devices free from skin contact  Skin Protection:   adhesive use limited   incontinence pads utilized   silicone foam dressing in place   tubing/devices free from skin contact  Taken 03/03/2024 2200 by Kerney Elbe, RN  Positioning for Skin: Left  Taken 03/03/2024 2000 by Kerney Elbe, RN  Positioning for Skin: Right  Device Skin Pressure Protection:   absorbent pad utilized/changed   adhesive use limited   tubing/devices free from skin contact  Skin Protection:   incontinence pads utilized   tubing/devices free from skin contact   silicone foam dressing in place  Intervention: Prevent and Manage VTE (Venous Thromboembolism) Risk  Recent Flowsheet Documentation  Taken 03/04/2024 0400 by Kerney Elbe, RN  Anti-Embolism Device Type: SCD, Knee  Anti-Embolism Device Status: On  Anti-Embolism Device Location: BLE  Taken 03/04/2024 0200 by Kerney Elbe, RN  Anti-Embolism Device Type: SCD, Knee  Anti-Embolism Device Status: On  Anti-Embolism Device Location: BLE  Taken 03/04/2024 0000 by Kerney Elbe, RN  Anti-Embolism Device Type: SCD, Knee  Anti-Embolism Device Status: On  Anti-Embolism Device Location: BLE  Taken 03/03/2024 2200 by Kerney Elbe, RN  Anti-Embolism Device Type: SCD, Knee  Anti-Embolism Device Status: On  Anti-Embolism Device Location: BLE  Taken 03/03/2024 2000 by Kerney Elbe, RN  Anti-Embolism Device Type: SCD, Knee  Anti-Embolism Device Status: On  Anti-Embolism Device Location: BLE  Intervention: Prevent Infection  Recent Flowsheet Documentation  Taken 03/04/2024 0400 by Kerney Elbe, RN  Infection Prevention:   environmental surveillance performed   hand hygiene promoted   equipment surfaces disinfected   rest/sleep promoted   single patient room provided  Taken 03/04/2024 0000 by Kerney Elbe, RN  Infection Prevention:   environmental surveillance performed   equipment surfaces disinfected   hand hygiene promoted   rest/sleep promoted   single patient room provided  Taken 03/03/2024 2000 by Kerney Elbe, RN  Infection Prevention:   environmental surveillance performed   equipment surfaces disinfected   hand hygiene promoted   rest/sleep promoted   single patient room provided  Goal: Optimal Comfort and Wellbeing  Outcome: Ongoing - Unchanged  Goal: Readiness for Transition of Care  Outcome: Ongoing - Unchanged  Goal: Rounds/Family Conference  Outcome: Ongoing - Unchanged

## 2024-03-04 NOTE — Unmapped (Signed)
 [facility] PROGRESS NOTE     Date of Service: 03/04/2024    Hospital Day: LOS: 6 days        Surgery Date: TBD   Surgical Attending: Merri Ray, MD    Critical Care Attending: Thalia Party, MD    Interval History:   Continues on heparin gtt iso PE. Still requiring low dose NE. Sedation off since 1500, no changes in neuro exam at this time:movement at upper shoulder, eyes and mouth during interventions. SBT this morning. Rising creatinine. Renal US ordered.    History of Present Illness:   Tammy Braun is a 83 y.o. female with PMH bladder cancer with cystectomy and ileal conduit, COPD, recent dx of afib, moderate-severe TR, CAD (high calcification score), T2DM presented to [facility] with projectile vomiting and abdominal pain, found to have SBO with transition point at level of ileostomy on CT A/P. On initial presentation, labs remarkable for leukocytosis thought to be iso hemoconcentration, AKI on CKD, mildly elevated venous lactate improved with resuscitation. Admitted to [facility] for worsening hypoxia on HFNC requiring intubation in setting of hiatal hernia. OR on 02/28/24 for ex-lap, reduciton of parastomal hernia, small bowel resection with primary anastomosis, lysis of adhesions, abdomen left open with ABThera in place.     Hospital Course:  - 02/26/24: Admitted to [facility], floor status, consult to urology for possible ileal conduit revision.   - 02/27/24: Patient level of care escalated to step-down iso increased oxygen needs while in ED.  - 02/28/24: Upgraded to ICU. Intubated at bed-side. OR for ex-lap, reduction of parastomal hernia, small bowel resection with primary anastomosis, lysis of adhesions, abdomen left open with ABThera in place.   - 03/01/24: OR for attempt at abdominal closure. Left open due to desaturations during closure attempt. ABThera in place.  - 03/03/24: RTOR for abdominal closure.     ASSESSMENT & PLAN:     Neurologic:  - Pain: Sch IV Tylenol, IV Robaxin, HM IV PRN 0.5-1mg   - Fent gtt and pushes  - Sedation: Propofol gtt, wean as able given abdominal closure    Cardiovascular:  #HTN, HLD:   - Chronic.    - MAP goal > 65    > requiring NE @7   - Holding home ASA and Lipitor    #Afib/flutter w RVR:   - Prior hx of paroxysmal afib. CHADSvasc = 6, not anticoagulated PTA per chart review.   - Continue amio gtt   - Holding home diltiazem, question efficacy given likely poor absorption  - Echo 3/1: LVEF > 55%. Severe TR.  - Pro-BNP: 6,988 on 3/3     Respiratory:  #Acute on chronic hypoxemia  #Hx of COPD:   - 2/28: Intubated  - Continue pulmonary toilet  - Home incruse ellipta   - PRN albuterol   - Start weaning sedation with goal of extubation when appropriate  - Daily SBT   - Daily ABG    FEN/GI:  F: Medlocked, bolus PRN  E: Replete electrolytes PRN  N: NPO   - TFs paused for 24h post-op  - TPN discontinued iso pending blood cx and hyperglycemia  - GI prophylaxis: zofran prn for nausea; IV Protonix   - Bowel regimen: miralax, senna    #S/p ex-lap, reduction of parastomal hernia, small bowel resection with primary anastomosis, lysis of adhesions, abdomen left open with ABThera in place 2/28    #Large hiatal hernia, chronic  - Non-operative, chronic    Renal/Genitourinary:  #AKI on CKD, prerenal:   -  Cr 3.71 from 3.60 (Baseline 1.4)  - FeNa 2/27 1.9% (Indeterminate)  - FeNa 3/3 0.5%  - UOP remains low ~ 25cc/hr      #Hydronephrosis:  - Caution with perioperative fluid resuscitation per Cards   - Consideration for c/s to nephrology iso hyperK, CKD   - If persistent AKI after closure consider renal ultrasound to assess hydro/obstruction    #H/o bladder cancer s/p radical cystectomy with ileal conduit (2012):  #Parastomal hernia:  - SRU consulted, following  - Strict I&Os  - Foley catheter per stoma, if it falls out, please notify Urology to replace   - renal US to assess for dilation with catheter in stoma: pending    Endocrine:   - Hyperglycemic over last 36h.   - Glycemic Control: SSI    Hematologic:   - Monitor CBC daily  - Hb 10.1 from 10.2. No concerns for active bleeding.      Immunologic/Infectious Disease:  - Febrile, leukocytosis to 11.9  - Cultures:   - Ucx (2/26): Mixed GP/GN organisms    - RPP (2/27): negative   - Blood cultures (3/4): NGTD   - BAL (3/4): 2+ GNR  - Abx:    - Zosyn x4d after source control, extended in setting of infectious work up     Musculoskeletal:   NAI    Daily Care Checklist:   - Stress Ulcer Prevention: Yes: Mechanically ventilated  - DVT Prophylaxis: Chemical: SQ Heparin  - Daily Awakening: Yes  - Spontaneous Breathing Trial:  post-op  - Indication for Central/PICC Line: Yes  Infusions requiring central access, Hemodynamic monitoring, and Inadequate peripheral access, RIJ triple lumen  - Indication for Urinary Catheter: Yes  Strict intake and output, Agressive diuresis/hydration, and Urinary retention/obstruction  - Diagnostic images/reports of past 24hrs reviewed: Yes    Disposition:   - Continue ICU care  - PT/OT consulted:  will consult post extubation    Armandina Gemma, ACNP   Surgical Intensive Care Unit  [facility] [place] [facility]       SUBJECTIVE:      Intubated/sedated     OBJECTIVE:     Physical Exam:  Constitutional: Lying supine, NAD  Neurologic: Sedated   Respiratory: Mechanically ventilated via ETT   Cardiovascular: Tachycardic, irregular rhythm, MAPs > 65 on NE@7   Gastrointestinal: Mildly distended abdomen, midline incision dressing c/d/i. Urostomy with Foley in stoma draining clear yellow urine.   Musculoskeletal: Trace to no BLE edema.  Skin: Warm and dry     Temp:  [36.3 ??C (97.4 ??F)-37.5 ??C (99.5 ??F)] 36.3 ??C (97.4 ??F)  Heart Rate:  [100-138] 136  SpO2 Pulse:  [55-136] 133  Resp:  [16-20] 18  BP: (86-144)/(58-106) 118/68  MAP (mmHg):  [70-117] 83  A BP-1: (86-141)/(52-84) 101/64  MAP:  [66 mmHg-98 mmHg] 77 mmHg  FiO2 (%):  [40 %] 40 %  SpO2:  [95 %-100 %] 100 %       Recent Laboratory Results:  Recent Labs     Units 03/04/24  0851   PHART  7.33*   PCO2ART mm Hg 33.3* PO2ART mm Hg 137.0*   HCO3ART mmol/L 17*   BEART  -7.6*   O2SATART % 99.0     Recent Labs     Units 03/04/24  1140 03/04/24  1529 03/04/24  2044   NA mmol/L 145 146* 144   K mmol/L 4.1 3.8 3.9   CL mmol/L 111* 110* 108*   CO2 mmol/L 18.0* 17.0* 19.0*  BUN mg/dL 83* 77* 92*   CREATININE mg/dL 1.61* 0.96* 0.45*   GLU mg/dL 409* 811* 914     Lab Results   Component Value Date    BILITOT 0.2 (L) 03/03/2024    BILITOT 0.5 02/26/2024    BILIDIR 0.10 03/03/2024    ALT 9 (L) 03/03/2024    ALT 12 03/01/2024    AST 22 03/03/2024    AST 12 03/01/2024    GGT 16 09/17/2011    GGT 354 (H) 05/03/2011    ALKPHOS 58 03/03/2024    ALKPHOS 101 02/26/2024    PROT 5.5 (L) 03/03/2024    PROT 9.2 (H) 02/26/2024    ALBUMIN 2.4 (L) 03/03/2024    ALBUMIN 4.5 02/26/2024     Recent Labs     Units 03/04/24  0208 03/04/24  0508 03/04/24  1343 03/04/24  1733   POCGLU mg/dL 782* 956* 213* 086     Recent Labs     Units 03/03/24  1544 03/04/24  0340 03/04/24  1529   WBC 10*9/L 13.9* 14.1* 18.4*   RBC 10*12/L 3.73* 3.54* 3.62*   HGB g/dL 57.8* 46.9* 62.9*   HCT % 32.2* 30.5* 31.4*   MCV fL 86.5 86.1 86.9   MCH pg 27.4 28.4 28.0   MCHC g/dL 52.8* 41.3 24.4   RDW % 18.2* 18.3* 18.4*   PLT 10*9/L 233 198 215   MPV fL 8.8 8.8 9.1     Recent Labs     Units 03/04/24  1529   INR  1.06   APTT sec 29.3      Lines & Tubes:   Patient Lines/Drains/Airways Status       Active Peripheral & Central Intravenous Access       Name Placement date Placement time Site Days    Peripheral IV 03/04/24 Anterior;Proximal;Right Forearm 03/04/24  0021  Forearm  less than 1    CVC Triple Lumen 02/28/24 Non-tunneled Right Internal jugular 02/28/24  1641  Internal jugular  5                     Patient Lines/Drains/Airways Status       Active Wounds       Name Placement date Placement time Site Days    Surgical Site 04/08/18 Shoulder Left 04/08/18  1338  -- 2157    Surgical Site 06/05/22 Eye Left 06/05/22  0928  -- 638    Surgical Site 06/19/22 Eye Right 06/19/22  1024  -- 624 Surgical Site 03/03/24 Abdomen 03/03/24  1013  -- 1    Wound 06/24/20 Other (comment) Buttocks inner right buttock 06/24/20  1010  Buttocks  1349                     Respiratory/ventilator settings for last 24 hours:   Vent Mode: PRVC  FiO2 (%): 40 %  S RR: 18  S VT: 330 mL  PEEP: 8 cm H20    Intake/Output last 3 shifts:  I/O last 3 completed shifts:  In: 2890.6 [I.V.:2030.5; NG/GT:150; IV Piggyback:366.1]  Out: 2235 [Urine:915; Emesis/NG output:60; Drains:1255; Blood:5]    Daily/Recent Weight:  68.6 kg (151 lb 3.8 oz)    BMI:  Body mass index is 28.59 kg/m??.                                  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    Chronic kidney disease     Depression, psychotic (CMS-HCC)     Diabetes mellitus (CMS-HCC)     in past    Emphysema of lung (CMS-HCC)     Financial difficulties     Frail elderly     Hearing impairment     Hernia     History of transfusion     Hyperlipidemia     Hypertension     Impaired mobility     Osteoporosis     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/08/2015    Visual impairment      Past Surgical History:   Procedure Laterality Date    ABDOMINAL SURGERY      BLADDER SURGERY      BREAST CYST EXCISION      CHEMOTHERAPY  2012    bladder    GALLBLADDER SURGERY      stone removal    ILEOSTOMY  2012    PR COLONOSCOPY FLX DX W/COLLJ SPEC WHEN PFRMD N/A 02/09/2015    Procedure: COLONOSCOPY, FLEXIBLE, PROXIMAL TO SPLENIC FLEXURE; DIAGNOSTIC, W/WO COLLECTION SPECIMEN BY BRUSH OR WASH;  Surgeon: Dewaine Conger, MD;  Location: [facility] [place];  Service: Gastroenterology    PR EXPLORATORY OF ABDOMEN N/A 02/28/2024    Procedure: EXPLORATORY LAPAROTOMY, EXPLORATORY CELIOTOMY WITH OR WITHOUT BIOPSY(S);  Surgeon: Renda Rolls, MD;  Location: [facility];  Service: General Surgery    PR RECONSTR TOTAL SHOULDER IMPLANT Left 04/08/2018    Procedure: ARTHROPLASTY, GLENOHUMERAL JOINT; TOTAL SHOULDER(GLENOID & PROXIMAL HUMERAL REPLACEMENT(EG, TOTAL SHOULDER);  Surgeon: Tomasa Rand, MD;  Location[facility] OR [facility];  Service: Ortho Sports Medicine    PR REOPEN RECENT ABD EXPLORATORY Midline 03/01/2024    Procedure: REOPENING OF RECENT LAPAROTOMY;  Surgeon: [name]., MD;  Location: OR [facility];  Service: Trauma    PR SIGMOIDOSCOPY,BIOPSY N/A 03/11/2015    Procedure: SIGMOIDOSCOPY, FLEXIBLE; WITH BIOPSY, SINGLE OR MULTIPLE;  Surgeon: Wilburt Finlay, MD;  [facility];  Service: Gastroenterology    PR XCAPSL CTRC RMVL INSJ IO LENS PROSTH W/O ECP Left 06/05/2022    Procedure: EXTRACAPSULAR CATARACT REMOVAL W/INSERTION OF INTRAOCULAR LENS PROSTHESIS, MANUAL OR MECHANICAL TECHNIQUE WITHOUT ENDOSCOPIC CYCLOPHOTOCOAGULATION;  Surgeon: Garner Gavel, MD;  Location: [facility] OR [facility];  Service: Ophthalmology    PR XCAPSL CTRC RMVL INSJ IO LENS PROSTH W/O ECP Right 06/19/2022    Procedure: EXTRACAPSULAR CATARACT REMOVAL W/INSERTION OF INTRAOCULAR LENS PROSTHESIS, MANUAL OR MECHANICAL TECHNIQUE WITHOUT ENDOSCOPIC CYCLOPHOTOCOAGULATION;  Surgeon: Garner Gavel, MD;  Location: [facility] OR [facility];  Service: Ophthalmology     Scheduled Medications:   [Provider Hold] aspirin  81 mg Oral Daily    [Provider Hold] atorvastatin  40 mg Oral Daily    budesonide (PULMICORT) nebulizer solution  0.5 mg Nebulization BID (RT)    flu vacc ts2024-25(49yr up)-PF  0.5 mL Intramuscular During hospitalization    [START ON 03/14/2024] fluPHENAZine decanoate  50 mg Intramuscular Q30 Days    insulin NPH  6 Units Subcutaneous Once    insulin regular  0-20 Units Subcutaneous Q6H SCH    ipratropium  500 mcg Nebulization Q6H (RT)    pantoprazole (Protonix) intravenous solution  40 mg  Intravenous Daily    piperacillin-tazobactam  3.375 g Intravenous Q12H     Continuous Infusions:   amiodarone 0.5 mg/min (03/04/24 2000)    fentaNYL citrate (PF) 50 mcg/mL infusion Stopped (03/04/24 1600)    heparin 12 Units/kg/hr (03/04/24 2000)    NORepinephrine bitartrate-NS 3 mcg/min (03/04/24 2000)    propofol 10 mg/mL infusion       PRN Medications:  albuterol, [Provider Hold] cyclobenzaprine, dextrose in water, dextrose in water, glucagon, glucagon, glucose, heparin (porcine), HYDROmorphone **OR** HYDROmorphone, nicotine polacrilex **OR** nicotine polacrilex, ondansetron

## 2024-03-04 NOTE — Unmapped (Signed)
 VENOUS ACCESS ULTRASOUND PROCEDURE NOTE    Indications:   Poor venous access.    The Venous Access Team has assessed this patient for the placement of a PIV. Ultrasound guidance was necessary to obtain access.     Procedure Details:  Identity of the patient was confirmed via name, medical record number and date of birth. The availability of the correct equipment was verified.    The vein was identified for ultrasound catheter insertion.  Field was prepared with necessary supplies and equipment.  Probe cover and sterile gel utilized.  Insertion site was prepped with chlorhexidine solution and allowed to dry.  The catheter extension was primed with normal saline. A(n) 22 gauge 1.75 catheter was placed in the R Forearm with 1attempt(s). See LDA for additional details.    Catheter aspirated, 2 mL blood return present. The catheter was then flushed with 10 mL of normal saline. Insertion site cleansed, and dressing applied per manufacturer guidelines. The catheter was inserted with difficulty due to poor vasculature by Fredderick Erb, RN.    Primary RN was notified.     Thank you,     Fredderick Erb, RN Venous Access Team   [phone]     Workup / Procedure Time:  30 minutes    See images below:

## 2024-03-04 NOTE — Unmapped (Signed)
 Adult Nutrition Brief Note      Visit Type: Follow-Up  Reason for Visit: Parenteral Nutrition    Plan:  Hold PN and wait for 48hrs negative cultures to restart  Starting trickle feeds-vital high protein at 11ml/hr  Blood sugar management per team  Will continue to follow daily with full assessments on Tuesdays    Interval Events:    Clinically showing signs of sepsis with worsening hyperglycemia, persistent vasopressor requirements.  New blood cultures obtained 3/4-no growth to date  Worsening AKI  On prop 17ml/hr=370kcal-TGs 235. Plan to wean prop  Abd drains with out over 24hrs      PN Start Date: 3/4

## 2024-03-05 LAB — PHOSPHORUS
PHOSPHORUS: 4.5 mg/dL (ref 2.4–5.1)
PHOSPHORUS: 5.1 mg/dL (ref 2.4–5.1)

## 2024-03-05 LAB — BASIC METABOLIC PANEL
ANION GAP: 16 mmol/L — ABNORMAL HIGH (ref 5–14)
ANION GAP: 17 mmol/L — ABNORMAL HIGH (ref 5–14)
ANION GAP: 20 mmol/L — ABNORMAL HIGH (ref 5–14)
ANION GAP: 18 mmol/L — ABNORMAL HIGH (ref 5–14)
ANION GAP: 18 mmol/L — ABNORMAL HIGH (ref 5–14)
BLOOD UREA NITROGEN: 95 mg/dL — ABNORMAL HIGH (ref 9–23)
BLOOD UREA NITROGEN: 94 mg/dL — ABNORMAL HIGH (ref 9–23)
BLOOD UREA NITROGEN: 97 mg/dL — ABNORMAL HIGH (ref 9–23)
BLOOD UREA NITROGEN: 97 mg/dL — ABNORMAL HIGH (ref 9–23)
BLOOD UREA NITROGEN: 98 mg/dL — ABNORMAL HIGH (ref 9–23)
BUN / CREAT RATIO: 26
BUN / CREAT RATIO: 25
BUN / CREAT RATIO: 25
BUN / CREAT RATIO: 24
BUN / CREAT RATIO: 24
CALCIUM: 8.3 mg/dL — ABNORMAL LOW (ref 8.7–10.4)
CALCIUM: 8.3 mg/dL — ABNORMAL LOW (ref 8.7–10.4)
CALCIUM: 8.4 mg/dL — ABNORMAL LOW (ref 8.7–10.4)
CALCIUM: 8.7 mg/dL (ref 8.7–10.4)
CALCIUM: 8.7 mg/dL (ref 8.7–10.4)
CHLORIDE: 109 mmol/L — ABNORMAL HIGH (ref 98–107)
CHLORIDE: 110 mmol/L — ABNORMAL HIGH (ref 98–107)
CHLORIDE: 107 mmol/L (ref 98–107)
CHLORIDE: 107 mmol/L (ref 98–107)
CHLORIDE: 108 mmol/L — ABNORMAL HIGH (ref 98–107)
CO2: 18 mmol/L — ABNORMAL LOW (ref 20.0–31.0)
CO2: 16 mmol/L — ABNORMAL LOW (ref 20.0–31.0)
CO2: 16 mmol/L — ABNORMAL LOW (ref 20.0–31.0)
CO2: 18 mmol/L — ABNORMAL LOW (ref 20.0–31.0)
CO2: 17 mmol/L — ABNORMAL LOW (ref 20.0–31.0)
CREATININE: 3.71 mg/dL — ABNORMAL HIGH (ref 0.55–1.02)
CREATININE: 3.69 mg/dL — ABNORMAL HIGH (ref 0.55–1.02)
CREATININE: 3.87 mg/dL — ABNORMAL HIGH (ref 0.55–1.02)
CREATININE: 4 mg/dL — ABNORMAL HIGH (ref 0.55–1.02)
CREATININE: 4.07 mg/dL — ABNORMAL HIGH (ref 0.55–1.02)
EGFR CKD-EPI (2021) FEMALE: 12 mL/min/{1.73_m2} — ABNORMAL LOW (ref >=60)
EGFR CKD-EPI (2021) FEMALE: 12 mL/min/{1.73_m2} — ABNORMAL LOW (ref >=60)
EGFR CKD-EPI (2021) FEMALE: 11 mL/min/{1.73_m2} — ABNORMAL LOW (ref >=60)
EGFR CKD-EPI (2021) FEMALE: 11 mL/min/{1.73_m2} — ABNORMAL LOW (ref >=60)
EGFR CKD-EPI (2021) FEMALE: 11 mL/min/{1.73_m2} — ABNORMAL LOW (ref >=60)
GLUCOSE RANDOM: 131 mg/dL (ref 70–179)
GLUCOSE RANDOM: 165 mg/dL (ref 70–179)
GLUCOSE RANDOM: 200 mg/dL — ABNORMAL HIGH (ref 70–179)
GLUCOSE RANDOM: 209 mg/dL — ABNORMAL HIGH (ref 70–179)
GLUCOSE RANDOM: 210 mg/dL — ABNORMAL HIGH (ref 70–179)
POTASSIUM: 4 mmol/L (ref 3.4–4.8)
POTASSIUM: 3.8 mmol/L (ref 3.4–4.8)
POTASSIUM: 4 mmol/L (ref 3.4–4.8)
POTASSIUM: 4 mmol/L (ref 3.4–4.8)
POTASSIUM: 4.1 mmol/L (ref 3.4–4.8)
SODIUM: 143 mmol/L (ref 135–145)
SODIUM: 143 mmol/L (ref 135–145)
SODIUM: 143 mmol/L (ref 135–145)
SODIUM: 143 mmol/L (ref 135–145)
SODIUM: 143 mmol/L (ref 135–145)

## 2024-03-05 LAB — BLOOD GAS CRITICAL CARE PANEL, ARTERIAL
BASE EXCESS ARTERIAL: -7.2 — ABNORMAL LOW (ref -2.0–2.0)
CALCIUM IONIZED ARTERIAL (MG/DL): 4.62 mg/dL (ref 4.40–5.40)
GLUCOSE WHOLE BLOOD: 132 mg/dL (ref 70–179)
HCO3 ARTERIAL: 17 mmol/L — ABNORMAL LOW (ref 22–27)
HEMOGLOBIN BLOOD GAS: 9.6 g/dL — ABNORMAL LOW (ref 12.00–16.00)
LACTATE BLOOD ARTERIAL: 0.9 mmol/L (ref <1.3)
O2 SATURATION ARTERIAL: 98.7 % (ref 94.0–100.0)
PCO2 ARTERIAL: 30.5 mmHg — ABNORMAL LOW (ref 35.0–45.0)
PH ARTERIAL: 7.37 (ref 7.35–7.45)
PO2 ARTERIAL: 132 mmHg — ABNORMAL HIGH (ref 80.0–110.0)
POTASSIUM WHOLE BLOOD: 3.7 mmol/L (ref 3.4–4.6)
SODIUM WHOLE BLOOD: 141 mmol/L (ref 135–145)

## 2024-03-05 LAB — CBC
HEMATOCRIT: 29.7 % — ABNORMAL LOW (ref 34.0–44.0)
HEMATOCRIT: 29 % — ABNORMAL LOW (ref 34.0–44.0)
HEMOGLOBIN: 9.6 g/dL — ABNORMAL LOW (ref 11.3–14.9)
HEMOGLOBIN: 9.4 g/dL — ABNORMAL LOW (ref 11.3–14.9)
MEAN CORPUSCULAR HEMOGLOBIN CONC: 32.4 g/dL (ref 32.0–36.0)
MEAN CORPUSCULAR HEMOGLOBIN CONC: 32.4 g/dL (ref 32.0–36.0)
MEAN CORPUSCULAR HEMOGLOBIN: 27.3 pg (ref 25.9–32.4)
MEAN CORPUSCULAR HEMOGLOBIN: 27.5 pg (ref 25.9–32.4)
MEAN CORPUSCULAR VOLUME: 84.3 fL (ref 77.6–95.7)
MEAN CORPUSCULAR VOLUME: 84.8 fL (ref 77.6–95.7)
MEAN PLATELET VOLUME: 9.4 fL (ref 6.8–10.7)
MEAN PLATELET VOLUME: 9.6 fL (ref 6.8–10.7)
PLATELET COUNT: 228 10*9/L (ref 150–450)
PLATELET COUNT: 256 10*9/L (ref 150–450)
RED BLOOD CELL COUNT: 3.53 10*12/L — ABNORMAL LOW (ref 3.95–5.13)
RED BLOOD CELL COUNT: 3.41 10*12/L — ABNORMAL LOW (ref 3.95–5.13)
RED CELL DISTRIBUTION WIDTH: 18.2 % — ABNORMAL HIGH (ref 12.2–15.2)
RED CELL DISTRIBUTION WIDTH: 18.6 % — ABNORMAL HIGH (ref 12.2–15.2)
WBC ADJUSTED: 21.9 10*9/L — ABNORMAL HIGH (ref 3.6–11.2)
WBC ADJUSTED: 24.7 10*9/L — ABNORMAL HIGH (ref 3.6–11.2)

## 2024-03-05 LAB — CYSTATIN C
CYSTATIN C: 4.63 mg/L — ABNORMAL HIGH (ref 0.64–1.23)
EGFR CKD-EPI (2012) CYSTATIN C FEMALE: 9 mL/min/{1.73_m2} — ABNORMAL LOW (ref >=60)

## 2024-03-05 LAB — MAGNESIUM
MAGNESIUM: 2.8 mg/dL — ABNORMAL HIGH (ref 1.6–2.6)
MAGNESIUM: 2.8 mg/dL — ABNORMAL HIGH (ref 1.6–2.6)

## 2024-03-05 LAB — APTT
APTT: 61.9 s — ABNORMAL HIGH (ref 24.8–38.4)
HEPARIN CORRELATION: 0.4

## 2024-03-05 LAB — PRO-BNP: PRO-BNP: 5983 pg/mL — ABNORMAL HIGH (ref <=300.0)

## 2024-03-05 MED ADMIN — ipratropium (ATROVENT) 0.02 % nebulizer solution 500 mcg: 500 ug | RESPIRATORY_TRACT | @ 22:00:00

## 2024-03-05 MED ADMIN — amiodarone 360 mg in dextrose 5% 200 mL (1.8 mg/mL) infusion PMB: .5 mg/min | INTRAVENOUS | @ 06:00:00

## 2024-03-05 MED ADMIN — atorvastatin (LIPITOR) tablet 40 mg: 40 mg | GASTROENTERAL | @ 17:00:00

## 2024-03-05 MED ADMIN — ipratropium (ATROVENT) 0.02 % nebulizer solution 500 mcg: 500 ug | RESPIRATORY_TRACT | @ 02:00:00

## 2024-03-05 MED ADMIN — polyethylene glycol (MIRALAX) packet 17 g: 17 g | ORAL | @ 16:00:00

## 2024-03-05 MED ADMIN — aspirin chewable tablet 81 mg: 81 mg | GASTROENTERAL | @ 17:00:00

## 2024-03-05 MED ADMIN — amiodarone 360 mg in dextrose 5% 200 mL (1.8 mg/mL) infusion PMB: .5 mg/min | INTRAVENOUS | @ 18:00:00

## 2024-03-05 MED ADMIN — NORepinephrine 8 mg in dextrose 5 % 250 mL (32 mcg/mL) infusion PMB: 0-10 ug/min | INTRAVENOUS | @ 08:00:00

## 2024-03-05 MED ADMIN — ipratropium (ATROVENT) 0.02 % nebulizer solution 500 mcg: 500 ug | RESPIRATORY_TRACT | @ 10:00:00

## 2024-03-05 MED ADMIN — piperacillin-tazobactam (ZOSYN) 3.375 g in sodium chloride 0.9 % (NS) 100 mL IVPB-MBP: 3.375 g | INTRAVENOUS | @ 11:00:00 | Stop: 2024-03-11

## 2024-03-05 MED ADMIN — insulin regular (HumuLIN,NovoLIN) injection 0-20 Units: 0-20 [IU] | SUBCUTANEOUS | @ 17:00:00

## 2024-03-05 MED ADMIN — ipratropium (ATROVENT) 0.02 % nebulizer solution 500 mcg: 500 ug | RESPIRATORY_TRACT | @ 14:00:00

## 2024-03-05 MED ADMIN — piperacillin-tazobactam (ZOSYN) 3.375 g in sodium chloride 0.9 % (NS) 100 mL IVPB-MBP: 3.375 g | INTRAVENOUS | @ 22:00:00 | Stop: 2024-03-11

## 2024-03-05 MED ADMIN — insulin regular (HumuLIN,NovoLIN) injection 0-20 Units: 0-20 [IU] | SUBCUTANEOUS | @ 22:00:00

## 2024-03-05 MED ADMIN — budesonide (PULMICORT) nebulizer solution 0.5 mg: .5 mg | RESPIRATORY_TRACT | @ 14:00:00

## 2024-03-05 MED ADMIN — pantoprazole (Protonix) injection 40 mg: 40 mg | INTRAVENOUS | @ 16:00:00

## 2024-03-05 MED ADMIN — budesonide (PULMICORT) nebulizer solution 0.5 mg: .5 mg | RESPIRATORY_TRACT | @ 02:00:00

## 2024-03-05 MED ADMIN — acetaminophen (TYLENOL) oral liquid: 1000 mg | GASTROENTERAL | @ 21:00:00

## 2024-03-05 NOTE — Unmapped (Signed)
 [facility]U PROGRESS NOTE     Date of Service: 03/05/2024    Hospital Day: LOS: 7 days        Surgery Date: TBD   Surgical Attending: Merri Ray, MD    Critical Care Attending: Thalia Party, MD    Interval History:   Low urine output noted. 500cc LR and 250cc of 5% albumin given. Not very responsive to fluid or colloid. Zosyn discontinued and meropenem started. Continues on NE. Another 500cc LR given for poor UOP.    History of Present Illness:   Tammy Braun is a 83 y.o. female with PMH bladder cancer with cystectomy and ileal conduit, COPD, recent dx of afib, moderate-severe TR, CAD (high calcification score), T2DM presented to [facility] with projectile vomiting and abdominal pain, found to have SBO with transition point at level of ileostomy on CT A/P. On initial presentation, labs remarkable for leukocytosis thought to be iso hemoconcentration, AKI on CKD, mildly elevated venous lactate improved with resuscitation. Admitted to [facility] for worsening hypoxia on HFNC requiring intubation in setting of hiatal hernia. OR on 02/28/24 for ex-lap, reduciton of parastomal hernia, small bowel resection with primary anastomosis, lysis of adhesions, abdomen left open with ABThera in place.     Hospital Course:  - 02/26/24: Admitted to [facility], floor status, consult to urology for possible ileal conduit revision.   - 02/27/24: Patient level of care escalated to step-down iso increased oxygen needs while in ED.  - 02/28/24: Upgraded to ICU. Intubated at bed-side. OR for ex-lap, reduction of parastomal hernia, small bowel resection with primary anastomosis, lysis of adhesions, abdomen left open with ABThera in place.   - 03/01/24: OR for attempt at abdominal closure. Left open due to desaturations during closure attempt. ABThera in place.  - 03/03/24: RTOR for abdominal closure.     ASSESSMENT & PLAN:     Neurologic:  - Pain: HM IV PRN 0.5-1mg , tylenol    > start additional MMPC agents when neurostatus improves  - Sedation: none    Cardiovascular:  #HTN, HLD:   - Chronic.    - MAP goal > 65    > requiring NE @1 , wean as able  - Resume home ASA and Lipitor     #Afib/flutter w RVR:   - Prior hx of paroxysmal afib. CHADSvasc = 6, not anticoagulated PTA per chart review.   - Continue amio gtt, heparin gtt started iso PE   - Holding home diltiazem, question efficacy given likely poor absorption  - Echo 3/1: LVEF > 55%. Severe TR.  - Pro-BNP: 6,988 on 3/3     #Pulmonary embolism  - 3/5 CTA chest: Filling defect in L anterior segmental artery  - heparin gtt     #Acute thrombus right basilic v.   - Identified 3/3 on BUE duplex.  - Supportive care     Respiratory:  #Acute on chronic hypoxemia  #Hx of COPD:   - 2/28: Intubated  - Continue pulmonary toilet  - Home incruse ellipta held  - Daily SBT  - Daily ABG    FEN/GI:  F: Medlocked, bolus PRN  E: Replete electrolytes PRN  N: NPO   - TFs at goal   - GI prophylaxis: zofran prn for nausea; IV Protonix   - bowel regimen: miralax, senna     > consider suppository tomorrow if no BM    #S/p ex-lap, reduction of parastomal hernia, small bowel resection with primary anastomosis, lysis of adhesions, abdomen left open with ABThera in  place 2/28    #Large hiatal hernia, chronic  - Non-operative, chronic    Renal/Genitourinary:  #AKI on CKD, prerenal:   - Cr 4.13 from 4.07 (Baseline 1.4)  - UOP poor  - RUS completed: Bilateral renal cysts measuring up to 2.6 cm in the right upper pole and 1.9 cm left interpolar kidney     #Hydronephrosis:  - Caution with perioperative fluid resuscitation per Cards   - RUS completed:  No hydronephrosis.     #H/o bladder cancer s/p radical cystectomy with ileal conduit (2012):  #Parastomal hernia:  - SRU consulted, following  - Strict I&Os  - Foley catheter per stoma, if it falls out, please notify Urology to replace      Endocrine:   #hyperglycemia   - Glycemic Control: SSI    Hematologic:   - Monitor CBC daily  - Hb 9.6 from 10.2. No concerns for active bleeding.      Immunologic/Infectious Disease:  - Afebrile, leukocytosis to 24.7  - Cultures:   - Ucx (2/26): Mixed GP/GN organisms    - RPP (2/27): negative   - Blood cultures (3/4): NGTD   - BAL (3/4): 2+ GNR    > Call microlab for cultures and sensitivities   - Abx:    - Zosyn (3/1 - 3/5) for abdominal contamination  - Zosyn discontinued 3/6  -Meropenum started 3/6 for presumed Psedumonas PNA       Musculoskeletal:   NAI    Daily Care Checklist:   - Stress Ulcer Prevention: Yes: Mechanically ventilated  - DVT Prophylaxis: Chemical: Heparin drip  - Daily Awakening: Yes  - Spontaneous Breathing Trial: Yes  - Indication for Central/PICC Line: Yes  Infusions requiring central access, Hemodynamic monitoring, and Inadequate peripheral access, RIJ triple lumen  - Indication for Urinary Catheter: Yes  Strict intake and output, Agressive diuresis/hydration, and Urinary retention/obstruction  - Diagnostic images/reports of past 24hrs reviewed: Yes    Disposition:   - Continue ICU care  - PT/OT consulted:  will consult post extubation    Tammy Braun is critically ill due to acute kidney injury, altered mental status, anemia - acute blood loss, and atrial fibrillation, SBO, open abdomen, postoperative mechanical ventilation. This critical care time includes examining the patient, evaluating the hemodynamic, laboratory, and radiographic data, independently developing a comprehensive management plan, and serially assessing the patient's response to these critical care interventions. This critical care time excludes procedures.     Critical care time: 45 minutes     Armandina Gemma, ACNP   Surgical Intensive Care Unit  [place] [facility] [place] [facility]       SUBJECTIVE:      Intubated/sedated     OBJECTIVE:     Physical Exam:  Constitutional: Lying supine, NAD  Neurologic: Grimaces to painful stimuli.   Respiratory: Mechanically ventilated via ETT   Cardiovascular: Tachycardic, irregular rhythm, MAPs > 65 on NE@1   Gastrointestinal: Mildly distended abdomen, midline incision dressing c/d/i. Urostomy with Foley in stoma draining clear yellow urine.   Musculoskeletal: Trace to no BLE edema.  Skin: Warm and dry     Temp:  [36.4 ??C (97.5 ??F)-37.2 ??C (99 ??F)] 36.4 ??C (97.5 ??F)  Heart Rate:  [110-133] 128  SpO2 Pulse:  [102-154] 126  Resp:  [18-30] 21  BP: (85-122)/(51-79) 108/64  MAP (mmHg):  [56-91] 79  A BP-1: (82-127)/(51-76) 100/59  MAP:  [63 mmHg-96 mmHg] 74 mmHg  FiO2 (%):  [40 %] 40 %  SpO2:  [  96 %-100 %] 99 %       Recent Laboratory Results:  Recent Labs     Units 03/05/24  0225   PHART  7.37   PCO2ART mm Hg 30.5*   PO2ART mm Hg 132.0*   HCO3ART mmol/L 17*   BEART  -7.2*   O2SATART % 98.7     Recent Labs     Units 03/05/24  1215 03/05/24  1553 03/05/24  2223   NA mmol/L 143 143 143   K mmol/L 4.0 4.0 4.1   CL mmol/L 107 107 108*   CO2 mmol/L 16.0* 18.0* 17.0*   BUN mg/dL 97* 97* 98*   CREATININE mg/dL 4.78* 2.95* 6.21*   GLU mg/dL 308* 657* 846*     Lab Results   Component Value Date    BILITOT 0.2 (L) 03/03/2024    BILITOT 0.5 02/26/2024    BILIDIR 0.10 03/03/2024    ALT 9 (L) 03/03/2024    ALT 12 03/01/2024    AST 22 03/03/2024    AST 12 03/01/2024    GGT 16 09/17/2011    GGT 354 (H) 05/03/2011    ALKPHOS 58 03/03/2024    ALKPHOS 101 02/26/2024    PROT 5.5 (L) 03/03/2024    PROT 9.2 (H) 02/26/2024    ALBUMIN 2.4 (L) 03/03/2024    ALBUMIN 4.5 02/26/2024     Recent Labs     Units 03/05/24  0015 03/05/24  0531 03/05/24  1154 03/05/24  1708   POCGLU mg/dL 962 952 841* 324*     Recent Labs     Units 03/04/24  1529 03/05/24  0325 03/05/24  1553   WBC 10*9/L 18.4* 21.9* 24.7*   RBC 10*12/L 3.62* 3.53* 3.41*   HGB g/dL 40.1* 9.6* 9.4*   HCT % 31.4* 29.7* 29.0*   MCV fL 86.9 84.3 84.8   MCH pg 28.0 27.3 27.5   MCHC g/dL 02.7 25.3 66.4   RDW % 18.4* 18.2* 18.6*   PLT 10*9/L 215 228 256   MPV fL 9.1 9.4 9.6     Recent Labs     Units 03/05/24  0538   APTT sec 61.9*      Lines & Tubes:   Patient Lines/Drains/Airways Status       Active Peripheral & Central Intravenous Access Name Placement date Placement time Site Days    Peripheral IV 03/04/24 Anterior;Proximal;Right Forearm 03/04/24  0021  Forearm  1    CVC Triple Lumen 02/28/24 Non-tunneled Right Internal jugular 02/28/24  1641  Internal jugular  6                     Patient Lines/Drains/Airways Status       Active Wounds       Name Placement date Placement time Site Days    Surgical Site 04/08/18 Shoulder Left 04/08/18  1338  -- 2158    Surgical Site 06/05/22 Eye Left 06/05/22  0928  -- 639    Surgical Site 06/19/22 Eye Right 06/19/22  1024  -- 625    Surgical Site 03/03/24 Abdomen 03/03/24  1013  -- 2    Wound 06/24/20 Other (comment) Buttocks inner right buttock 06/24/20  1010  Buttocks  1350                     Respiratory/ventilator settings for last 24 hours:   Vent Mode: PRVC  FiO2 (%): 40 %  S RR: 18  S VT: 300  mL  PEEP: 8 cm H20  PR SUP: 10 cm H20    Intake/Output last 3 shifts:  I/O last 3 completed shifts:  In: 2073.8 [I.V.:630.3; NG/GT:1020; IV Piggyback:114.2]  Out: 1985 [Urine:785; Drains:1200]    Daily/Recent Weight:  68.9 kg (151 lb 14.4 oz)    BMI:  Body mass index is 28.72 kg/m??.                                  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    Chronic kidney disease     Depression, psychotic (CMS-HCC)     Diabetes mellitus (CMS-HCC)     in past    Emphysema of lung (CMS-HCC)     Financial difficulties     Frail elderly     Hearing impairment     Hernia     History of transfusion     Hyperlipidemia     Hypertension     Impaired mobility     Osteoporosis     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/08/2015    Visual impairment      Past Surgical History:   Procedure Laterality Date    ABDOMINAL SURGERY      BLADDER SURGERY      BREAST CYST EXCISION      CHEMOTHERAPY  2012    bladder    GALLBLADDER SURGERY      stone removal    ILEOSTOMY  2012    PR COLONOSCOPY FLX DX W/COLLJ SPEC WHEN PFRMD N/A 02/09/2015    Procedure: COLONOSCOPY, FLEXIBLE, PROXIMAL TO SPLENIC FLEXURE; DIAGNOSTIC, W/WO COLLECTION SPECIMEN BY BRUSH OR WASH;  Surgeon: Dewaine Conger, MD;  Location: [facility] [place];  Service: Gastroenterology    PR EXPLORATORY OF ABDOMEN N/A 02/28/2024    Procedure: EXPLORATORY LAPAROTOMY, EXPLORATORY CELIOTOMY WITH OR WITHOUT BIOPSY(S);  Surgeon: Renda Rolls, MD;  Location: [facility];  Service: General Surgery    PR RECONSTR TOTAL SHOULDER IMPLANT Left 04/08/2018    Procedure: ARTHROPLASTY, GLENOHUMERAL JOINT; TOTAL SHOULDER(GLENOID & PROXIMAL HUMERAL REPLACEMENT(EG, TOTAL SHOULDER);  Surgeon: Tomasa Rand, MD;  Location: [facility];  Service: Ortho Sports Medicine    PR REOPEN RECENT ABD EXPLORATORY Midline 03/01/2024    Procedure: REOPENING OF RECENT LAPAROTOMY;  Surgeon: [name]., MD;  Location: OR [facility];  Service: Trauma    PR REOPEN RECENT ABD EXPLORATORY N/A 03/03/2024    Procedure: REOPENING OF RECENT LAPAROTOMY;  Surgeon: Joanie Coddington, MD;  Location: OR [facility];  Service: Trauma    PR SIGMOIDOSCOPY,BIOPSY N/A 03/11/2015    Procedure: SIGMOIDOSCOPY, FLEXIBLE; WITH BIOPSY, SINGLE OR MULTIPLE;  Surgeon: Wilburt Finlay, MD;  [facility];  Service: Gastroenterology    PR XCAPSL CTRC RMVL INSJ IO LENS PROSTH W/O ECP Left 06/05/2022    Procedure: EXTRACAPSULAR CATARACT REMOVAL W/INSERTION OF INTRAOCULAR LENS PROSTHESIS, MANUAL OR MECHANICAL TECHNIQUE WITHOUT ENDOSCOPIC CYCLOPHOTOCOAGULATION;  Surgeon: Garner Gavel, MD;  Location: [facility] OR [facility];  Service: Ophthalmology    PR XCAPSL CTRC RMVL INSJ IO LENS PROSTH W/O ECP Right 06/19/2022    Procedure: EXTRACAPSULAR CATARACT REMOVAL W/INSERTION OF  INTRAOCULAR LENS PROSTHESIS, MANUAL OR MECHANICAL TECHNIQUE WITHOUT ENDOSCOPIC CYCLOPHOTOCOAGULATION;  Surgeon: Garner Gavel, MD;  Location: [facility] OR [facility];  Service: Ophthalmology     Scheduled Medications:   acetaminophen  1,000 mg Enteral tube: gastric Q8H [facility]    aspirin  81 mg Enteral tube: gastric Daily    atorvastatin  40 mg Enteral tube: gastric Daily    budesonide (PULMICORT) nebulizer solution  0.5 mg Nebulization BID (RT)    flu vacc [id number](78yr up)-PF  0.5 mL Intramuscular During hospitalization    [START ON 03/14/2024] fluPHENAZine decanoate  50 mg Intramuscular Q30 Days    insulin regular  0-20 Units Subcutaneous Q6H SCH    ipratropium  500 mcg Nebulization Q6H (RT)    meropenem  500 mg Intravenous Q12H    pantoprazole (Protonix) intravenous solution  40 mg Intravenous Daily    piperacillin-tazobactam  3.375 g Intravenous Q12H    polyethylene glycol  17 g Oral Daily    sennosides  2.5 mL Oral Nightly     Continuous Infusions:   amiodarone 0.5 mg/min (03/05/24 1300)    heparin 12 Units/kg/hr (03/05/24 1943)    NORepinephrine bitartrate-NS 3 mcg/min (03/05/24 2045)     PRN Medications:  albuterol, [Provider Hold] cyclobenzaprine, dextrose in water, heparin (porcine), HYDROmorphone **OR** HYDROmorphone, nicotine polacrilex **OR** nicotine polacrilex, ondansetron

## 2024-03-05 NOTE — Unmapped (Signed)
 [facility] Order [id number] printed and placed in your box for signature

## 2024-03-05 NOTE — Unmapped (Signed)
 [facility] PROGRESS NOTE     Date of Service: 03/05/2024    Hospital Day: LOS: 7 days        Surgery Date: TBD   Surgical Attending: Merri Ray, MD    Critical Care Attending: Thalia Party, MD    Interval History:     Propofol and fentanyl gtts off.     Heparin gtt running, heparin correlation therapeutic.     Sedation off since yesterday afternoon. Now grimacing to painful stimuli.     UOP continues to be low. Creatinine uptrended again to upper 3s after plateauing in low 3s.  Renal US complete, read pending.    History of Present Illness:   Tammy Braun is a 83 y.o. female with PMH bladder cancer with cystectomy and ileal conduit, COPD, recent dx of afib, moderate-severe TR, CAD (high calcification score), T2DM presented to [facility] with projectile vomiting and abdominal pain, found to have SBO with transition point at level of ileostomy on CT A/P. On initial presentation, labs remarkable for leukocytosis thought to be iso hemoconcentration, AKI on CKD, mildly elevated venous lactate improved with resuscitation. Admitted to [facility] for worsening hypoxia on HFNC requiring intubation in setting of hiatal hernia. OR on 02/28/24 for ex-lap, reduciton of parastomal hernia, small bowel resection with primary anastomosis, lysis of adhesions, abdomen left open with ABThera in place.     Hospital Course:  - 02/26/24: Admitted to [facility], floor status, consult to urology for possible ileal conduit revision.   - 02/27/24: Patient level of care escalated to step-down iso increased oxygen needs while in ED.  - 02/28/24: Upgraded to ICU. Intubated at bed-side. OR for ex-lap, reduction of parastomal hernia, small bowel resection with primary anastomosis, lysis of adhesions, abdomen left open with ABThera in place.   - 03/01/24: OR for attempt at abdominal closure. Left open due to desaturations during closure attempt. ABThera in place.  - 03/03/24: RTOR for abdominal closure.     ASSESSMENT & PLAN:     Neurologic:  - Pain: HM IV PRN 0.5-1mg , start tylenol    > start additional MMPC agents when neurostatus improves  - Sedation: none    Cardiovascular:  #HTN, HLD:   - Chronic.    - MAP goal > 65    > requiring NE @1 , wean as able  - Resume home ASA and Lipitor     #Afib/flutter w RVR:   - Prior hx of paroxysmal afib. CHADSvasc = 6, not anticoagulated PTA per chart review.   - Continue amio gtt, heparin gtt started iso PE   - Holding home diltiazem, question efficacy given likely poor absorption  - Echo 3/1: LVEF > 55%. Severe TR.  - Pro-BNP: 6,988 on 3/3     #Pulmonary embolism  - 3/5 CTA chest: Filling defect in L anterior segmental artery  - heparin gtt     #Acute thrombus right basilic v.   - Identified 3/3 on BUE duplex.  - Supportive care     Respiratory:  #Acute on chronic hypoxemia  #Hx of COPD:   - 2/28: Intubated  - Continue pulmonary toilet  - Home incruse ellipta held  - Daily SBT - failed this AM for requiring back up mode  - Daily ABG    FEN/GI:  F: Medlocked, bolus PRN  E: Replete electrolytes PRN  N: NPO   - Resume full TFs. Increase to goal   - GI prophylaxis: zofran prn for nausea; IV Protonix   - Resume bowel regimen:  miralax, senna     > consider suppository tomorrow if no BM    #S/p ex-lap, reduction of parastomal hernia, small bowel resection with primary anastomosis, lysis of adhesions, abdomen left open with ABThera in place 2/28    #Large hiatal hernia, chronic  - Non-operative, chronic    Renal/Genitourinary:  #AKI on CKD, prerenal:   - Cr 3.71 from 3.60 (Baseline 1.4)  - FeNa 2/27 1.9% (Indeterminate)  - FeNa 3/3 0.5%  - UOP remains low ~ 25cc/hr   - RUS completed overnight, read pending      #Hydronephrosis:  - Caution with perioperative fluid resuscitation per Cards   - RUS completed overnight, read pending     #H/o bladder cancer s/p radical cystectomy with ileal conduit (2012):  #Parastomal hernia:  - SRU consulted, following  - Strict I&Os  - Foley catheter per stoma, if it falls out, please notify Urology to replace    - renal US to assess for dilation with catheter in stoma: pending    Endocrine:   - Euglycemic since yesterday PM    - Glycemic Control: SSI    Hematologic:   - Monitor CBC daily  - Hb 9.6 from 10.2. No concerns for active bleeding.      Immunologic/Infectious Disease:  - Afebrile, leukocytosis to 21.9  - Cultures:   - Ucx (2/26): Mixed GP/GN organisms    - RPP (2/27): negative   - Blood cultures (3/4): NGTD   - BAL (3/4): 2+ GNR    > Call microlab for cultures and sensitivities   - Abx:    - Zosyn (3/1 - 3/5) for abdominal contamination  - Zosyn (3/4 - 3/11) for presumed PNA       Musculoskeletal:   NAI    Daily Care Checklist:   - Stress Ulcer Prevention: Yes: Mechanically ventilated  - DVT Prophylaxis: Chemical: Heparin drip  - Daily Awakening: Yes  - Spontaneous Breathing Trial: Yes  - Indication for Central/PICC Line: Yes  Infusions requiring central access, Hemodynamic monitoring, and Inadequate peripheral access, RIJ triple lumen  - Indication for Urinary Catheter: Yes  Strict intake and output, Agressive diuresis/hydration, and Urinary retention/obstruction  - Diagnostic images/reports of past 24hrs reviewed: Yes    Disposition:   - Continue ICU care  - PT/OT consulted:  will consult post extubation    Oliver Hum, MD   Surgical Intensive Care Unit  [place] [facility] [place] [facility]       SUBJECTIVE:      Intubated/sedated     OBJECTIVE:     Physical Exam:  Constitutional: Lying supine, NAD  Neurologic: Grimaces to painful stimuli.   Respiratory: Mechanically ventilated via ETT   Cardiovascular: Tachycardic, irregular rhythm, MAPs > 65 on NE@1   Gastrointestinal: Mildly distended abdomen, midline incision dressing c/d/i. Urostomy with Foley in stoma draining clear yellow urine.   Musculoskeletal: Trace to no BLE edema.  Skin: Warm and dry     Temp:  [36.3 ??C (97.4 ??F)-36.9 ??C (98.4 ??F)] 36.9 ??C (98.4 ??F)  Heart Rate:  [101-138] 122  SpO2 Pulse:  [91-136] 120  Resp:  [16-29] 21  BP: (90-129)/(58-91) 96/65  MAP (mmHg):  [56-94] 75  A BP-1: (86-119)/(52-84) 96/64  MAP:  [66 mmHg-93 mmHg] 76 mmHg  FiO2 (%):  [40 %] 40 %  SpO2:  [95 %-100 %] 100 %       Recent Laboratory Results:  Recent Labs     Units 03/05/24  0225  PHART  7.37   PCO2ART mm Hg 30.5*   PO2ART mm Hg 132.0*   HCO3ART mmol/L 17*   BEART  -7.2*   O2SATART % 98.7     Recent Labs     Units 03/04/24  2044 03/04/24  2318 03/05/24  0225 03/05/24  0325   NA mmol/L 144 145 141 143   K mmol/L 3.9 4.0 3.7 4.0   CL mmol/L 108* 109*  --  109*   CO2 mmol/L 19.0* 19.0*  --  18.0*   BUN mg/dL 92* 95*  --  95*   CREATININE mg/dL 1.61* 0.96*  --  0.45*   GLU mg/dL 409 811  --  914     Lab Results   Component Value Date    BILITOT 0.2 (L) 03/03/2024    BILITOT 0.5 02/26/2024    BILIDIR 0.10 03/03/2024    ALT 9 (L) 03/03/2024    ALT 12 03/01/2024    AST 22 03/03/2024    AST 12 03/01/2024    GGT 16 09/17/2011    GGT 354 (H) 05/03/2011    ALKPHOS 58 03/03/2024    ALKPHOS 101 02/26/2024    PROT 5.5 (L) 03/03/2024    PROT 9.2 (H) 02/26/2024    ALBUMIN 2.4 (L) 03/03/2024    ALBUMIN 4.5 02/26/2024     Recent Labs     Units 03/04/24  1343 03/04/24  1733 03/04/24  2320 03/05/24  0015   POCGLU mg/dL 782* 956 213 086     Recent Labs     Units 03/04/24  0340 03/04/24  1529 03/05/24  0325   WBC 10*9/L 14.1* 18.4* 21.9*   RBC 10*12/L 3.54* 3.62* 3.53*   HGB g/dL 57.8* 46.9* 9.6*   HCT % 30.5* 31.4* 29.7*   MCV fL 86.1 86.9 84.3   MCH pg 28.4 28.0 27.3   MCHC g/dL 62.9 52.8 41.3   RDW % 18.3* 18.4* 18.2*   PLT 10*9/L 198 215 228   MPV fL 8.8 9.1 9.4     Recent Labs     Units 03/04/24  1529 03/04/24  2318   INR  1.06  --    APTT sec 29.3 61.9*      Lines & Tubes:   Patient Lines/Drains/Airways Status       Active Peripheral & Central Intravenous Access       Name Placement date Placement time Site Days    Peripheral IV 03/04/24 Anterior;Proximal;Right Forearm 03/04/24  0021  Forearm  1    CVC Triple Lumen 02/28/24 Non-tunneled Right Internal jugular 02/28/24  1641  Internal jugular 5                     Patient Lines/Drains/Airways Status       Active Wounds       Name Placement date Placement time Site Days    Surgical Site 04/08/18 Shoulder Left 04/08/18  1338  -- 2157    Surgical Site 06/05/22 Eye Left 06/05/22  0928  -- 638    Surgical Site 06/19/22 Eye Right 06/19/22  1024  -- 624    Surgical Site 03/03/24 Abdomen 03/03/24  1013  -- 1    Wound 06/24/20 Other (comment) Buttocks inner right buttock 06/24/20  1010  Buttocks  1349                     Respiratory/ventilator settings for last 24 hours:   Vent Mode: PRVC  FiO2 (%):  40 %  S RR: 15  S VT: 330 mL  PEEP: 8 cm H20  PR SUP: 10 cm H20    Intake/Output last 3 shifts:  I/O last 3 completed shifts:  In: 2890.6 [I.V.:2030.5; NG/GT:150; IV Piggyback:366.1]  Out: 2235 [Urine:915; Emesis/NG output:60; Drains:1255; Blood:5]    Daily/Recent Weight:  68.9 kg (151 lb 14.4 oz)    BMI:  Body mass index is 28.72 kg/m??.                                  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    Chronic kidney disease     Depression, psychotic (CMS-HCC)     Diabetes mellitus (CMS-HCC)     in past    Emphysema of lung (CMS-HCC)     Financial difficulties     Frail elderly     Hearing impairment     Hernia     History of transfusion     Hyperlipidemia     Hypertension     Impaired mobility     Osteoporosis     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/08/2015    Visual impairment      Past Surgical History:   Procedure Laterality Date    ABDOMINAL SURGERY      BLADDER SURGERY      BREAST CYST EXCISION      CHEMOTHERAPY  2012    bladder    GALLBLADDER SURGERY      stone removal    ILEOSTOMY  2012    PR COLONOSCOPY FLX DX W/COLLJ SPEC WHEN PFRMD N/A 02/09/2015    Procedure: COLONOSCOPY, FLEXIBLE, PROXIMAL TO SPLENIC FLEXURE; DIAGNOSTIC, W/WO COLLECTION SPECIMEN BY BRUSH OR WASH;  Surgeon: Dewaine Conger, MD; Location: [facility];  Service: Gastroenterology    PR EXPLORATORY OF ABDOMEN N/A 02/28/2024    Procedure: EXPLORATORY LAPAROTOMY, EXPLORATORY CELIOTOMY WITH OR WITHOUT BIOPSY(S);  Surgeon: Renda Rolls, MD;  [facility];  Service: General Surgery    PR RECONSTR TOTAL SHOULDER IMPLANT Left 04/08/2018    Procedure: ARTHROPLASTY, GLENOHUMERAL JOINT; TOTAL SHOULDER(GLENOID & PROXIMAL HUMERAL REPLACEMENT(EG, TOTAL SHOULDER);  Surgeon: Tomasa Rand, MD;  Location: [facility] OR [facility];  Service: Ortho Sports Medicine    PR REOPEN RECENT ABD EXPLORATORY Midline 03/01/2024    Procedure: REOPENING OF RECENT LAPAROTOMY;  Surgeon: [name]., MD;  Location: OR [facility];  Service: Trauma    PR SIGMOIDOSCOPY,BIOPSY N/A 03/11/2015    Procedure: SIGMOIDOSCOPY, FLEXIBLE; WITH BIOPSY, SINGLE OR MULTIPLE;  Surgeon: Wilburt Finlay, MD;  [facility]: [facility];  Service: Gastroenterology    PR XCAPSL CTRC RMVL INSJ IO LENS PROSTH W/O ECP Left 06/05/2022    Procedure: EXTRACAPSULAR CATARACT REMOVAL W/INSERTION OF INTRAOCULAR LENS PROSTHESIS, MANUAL OR MECHANICAL TECHNIQUE WITHOUT ENDOSCOPIC CYCLOPHOTOCOAGULATION;  Surgeon: Garner Gavel, MD;  Location: [facility] OR [facility];  Service: Ophthalmology    PR XCAPSL CTRC RMVL INSJ IO LENS PROSTH W/O ECP Right 06/19/2022    Procedure: EXTRACAPSULAR CATARACT REMOVAL W/INSERTION OF INTRAOCULAR LENS PROSTHESIS, MANUAL OR MECHANICAL TECHNIQUE WITHOUT ENDOSCOPIC CYCLOPHOTOCOAGULATION;  Surgeon: Garner Gavel, MD;  Location: [facility]  OR [facility];  Service: Ophthalmology     Scheduled Medications:   [Provider Hold] aspirin  81 mg Oral Daily    [Provider Hold] atorvastatin  40 mg Oral Daily    budesonide (PULMICORT) nebulizer solution  0.5 mg Nebulization BID (RT)    flu vacc ts2024-25(23yr up)-PF  0.5 mL Intramuscular During hospitalization    [START ON 03/14/2024] fluPHENAZine decanoate  50 mg Intramuscular Q30 Days    insulin NPH  6 Units Subcutaneous Once    insulin regular  0-20 Units Subcutaneous Q6H SCH    ipratropium  500 mcg Nebulization Q6H (RT)    pantoprazole (Protonix) intravenous solution  40 mg Intravenous Daily    piperacillin-tazobactam  3.375 g Intravenous Q12H     Continuous Infusions:   amiodarone 0.5 mg/min (03/05/24 0400)    fentaNYL citrate (PF) 50 mcg/mL infusion Stopped (03/04/24 1600)    heparin 12 Units/kg/hr (03/05/24 0400)    NORepinephrine bitartrate-NS 3 mcg/min (03/05/24 0400)    propofol 10 mg/mL infusion       PRN Medications:  albuterol, [Provider Hold] cyclobenzaprine, dextrose in water, dextrose in water, glucagon, glucagon, glucose, heparin (porcine), HYDROmorphone **OR** HYDROmorphone, nicotine polacrilex **OR** nicotine polacrilex, ondansetron

## 2024-03-05 NOTE — Unmapped (Signed)
 UROLOGY CONSULT NOTE    Requesting Attending Physician:  Merri Ray, MD  Service Requesting Consult:  Peter Garter [facility])  Service Providing Consult: SRU  Consulting Attending: Dr. Katrinka Blazing     Assessment:  Patient is a 83 y.o. female with hx of bladder cancer s/p RCIC on 10/05/2011 for T2N0M0 with known parastomal hernia (previously stable and reducible) who presented to [facility] with N/V and abdominal distention 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 Also with stable mild BL hydronephrosis with likely pre-renal AKI. Last loopogram negative for stricture or obstruction (11/08/22).     Patient was taken to OR on 2/28 for ex-lap LOA, small bowel resection fistula takedown with primary repair, left open. On takeback on 3/2, Urology interrogated stoma with no signs of leak. Catheter placed in stoma with return of several hundred ml's UOP. SRH was unable to close patient 2/2 persistent small bowel dilation and patient instability. She is now s/p abdominal wall closure with SRH on 3/4.     Interval 3/6: Remains intubated on NE. UOP was 600ccs. Cr 3.87 this AM. CT chest was obtained and kidneys appeared decompressed on these images. Foley remains in urostomy.     Recommendations:  Continue Foley Catheter per stoma until creatine stabilizes.   Given unremarkable findings on CT chest, do not feel strongly about additional imaging.   Please page with additional concerns.     Thank you for this consult. Please page [phone] with any questions or concerns.    History of Present Illness:   Tammy Braun is seen in consultation for SBO at the request of Merri Ray, MD on the [facility]).     Endorses 1 day history of worsening abdominal pain associated with N/V. Feels constipation and has not had BM in at least a day. Minimal PO intake.     Denies change in urine output. Does not feel like her urine has been malodourous or has not noted decreased output from ostomy.     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    Chronic kidney disease     Depression, psychotic (CMS-HCC)     Diabetes mellitus (CMS-HCC)     in past    Emphysema of lung (CMS-HCC)     Financial difficulties     Frail elderly     Hearing impairment     Hernia     History of transfusion     Hyperlipidemia     Hypertension     Impaired mobility     Osteoporosis     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/08/2015    Visual impairment        Past Surgical History:   Past Surgical History:   Procedure Laterality Date    ABDOMINAL SURGERY      BLADDER SURGERY      BREAST CYST EXCISION      CHEMOTHERAPY  2012    bladder    GALLBLADDER SURGERY      stone removal    ILEOSTOMY  2012    PR COLONOSCOPY FLX DX W/COLLJ SPEC WHEN PFRMD N/A 02/09/2015    Procedure: COLONOSCOPY, FLEXIBLE, PROXIMAL TO SPLENIC FLEXURE; DIAGNOSTIC, W/WO COLLECTION SPECIMEN BY BRUSH OR WASH;  Surgeon: Dewaine Conger,  MD;  Location: [facility]ED[facility] [place];  Service: Gastroenterology    PR EXPLORATORY OF ABDOMEN N/A 02/28/2024    Procedure: EXPLORATORY LAPAROTOMY, EXPLORATORY CELIOTOMY WITH OR WITHOUT BIOPSY(S);  Surgeon: Renda Rolls, MD;  Location: [facility];  Service: General Surgery    PR RECONSTR TOTAL SHOULDER IMPLANT Left 04/08/2018    Procedure: ARTHROPLASTY, GLENOHUMERAL JOINT; TOTAL SHOULDER(GLENOID & PROXIMAL HUMERAL REPLACEMENT(EG, TOTAL SHOULDER);  Surgeon: Tomasa Rand, MD;  Location: [facility] OR [facility];  Service: Ortho Sports Medicine    PR REOPEN RECENT ABD EXPLORATORY Midline 03/01/2024    Procedure: REOPENING OF RECENT LAPAROTOMY;  Surgeon: [name]., MD;  Location: OR [facility];  Service: Trauma    PR REOPEN RECENT ABD EXPLORATORY N/A 03/03/2024    Procedure: REOPENING OF RECENT LAPAROTOMY;  Surgeon: Joanie Coddington, MD;  Location: OR [facility];  Service: Trauma    PR SIGMOIDOSCOPY,BIOPSY N/A 03/11/2015    Procedure: SIGMOIDOSCOPY, FLEXIBLE; WITH BIOPSY, SINGLE OR MULTIPLE;  Surgeon: Wilburt Finlay, MD;  [facility]: [facility];  Service: Gastroenterology    PR XCAPSL CTRC RMVL INSJ IO LENS PROSTH W/O ECP Left 06/05/2022    Procedure: EXTRACAPSULAR CATARACT REMOVAL W/INSERTION OF INTRAOCULAR LENS PROSTHESIS, MANUAL OR MECHANICAL TECHNIQUE WITHOUT ENDOSCOPIC CYCLOPHOTOCOAGULATION;  Surgeon: Garner Gavel, MD;  Location: [facility] OR [facility];  Service: Ophthalmology    PR XCAPSL CTRC RMVL INSJ IO LENS PROSTH W/O ECP Right 06/19/2022    Procedure: EXTRACAPSULAR CATARACT REMOVAL W/INSERTION OF INTRAOCULAR LENS PROSTHESIS, MANUAL OR MECHANICAL TECHNIQUE WITHOUT ENDOSCOPIC CYCLOPHOTOCOAGULATION;  Surgeon: Garner Gavel, MD;  Location: [employer] OR [facility];  Service: Ophthalmology       Medication:  Current Facility-Administered Medications   Medication Dose Route Frequency Provider Last Rate Last Admin    acetaminophen (TYLENOL) oral liquid  1,000 mg Enteral tube: gastric Q8H SCH Kodali, Resha A, MD        albuterol 2.5 mg /3 mL (0.083 %) nebulizer solution 2.5 mg  2.5 mg Nebulization Q6H PRN Oliver Hum, MD   2.5 mg at 02/28/24 1322    amiodarone 360 mg in dextrose 5% 200 mL (1.8 mg/mL) infusion PMB  0.5 mg/min Intravenous Continuous Lari, Kian B, MD 16.7 mL/hr at 03/05/24 0600 0.5 mg/min at 03/05/24 0600    aspirin chewable tablet 81 mg  81 mg Enteral tube: gastric Daily Oliver Hum, MD   81 mg at 03/05/24 1152    atorvastatin (LIPITOR) tablet 40 mg  40 mg Enteral tube: gastric Daily Oliver Hum, MD   40 mg at 03/05/24 1152    budesonide (PULMICORT) nebulizer solution 0.5 mg  0.5 mg Nebulization BID (RT) Oliver Hum, MD   0.5 mg at 03/05/24 0915    [Provider Hold] cyclobenzaprine (FLEXERIL) tablet 5 mg  5 mg Oral BID PRN Oletha Blend B, MD        dextrose (D10W) 10% bolus 125 mL  12.5 g Intravenous Q10 Min PRN Oliver Hum, MD        flu vaccine [phone] UP)(PF)(FLUZONE HIGH DOSE)  0.5 mL Intramuscular During hospitalization Oliver Hum, MD        [START ON 03/14/2024] fluPHENAZine decanoate (PROLIXIN) injection 50 mg  50 mg Intramuscular Q30 Days Oliver Hum, MD        heparin (porcine) 1000 unit/mL injection 2,000 Units  2,000 Units Intravenous Q6H PRN Oliver Hum, MD        heparin 25,000 Units/250 mL (100 units/mL) in 0.45% saline infusion (premade)  0-24 Units/kg/hr Intravenous Continuous Oliver Hum, MD 8.23 mL/hr at 03/05/24 0600 12 Units/kg/hr at 03/05/24 0600    HYDROmorphone (PF) (DILAUDID) injection Syrg 0.5 mg  0.5 mg Intravenous Q4H PRN Oliver Hum, MD        Or    HYDROmorphone (DILAUDID) injection 1 mg  1 mg Intravenous Q4H PRN Oliver Hum, MD        insulin regular (HumuLIN,NovoLIN) injection 0-20 Units  0-20 Units Subcutaneous Q6H SCH Gracelyn Nurse L, NP   2 Units at 03/05/24 1210    ipratropium (ATROVENT) 0.02 % nebulizer solution 500 mcg  500 mcg Nebulization Q6H (RT) Oliver Hum, MD   500 mcg at 03/05/24 8841    nicotine polacrilex (NICORETTE) gum 4 mg  4 mg Buccal Q1H PRN Oliver Hum, MD        Or    nicotine polacrilex (NICORETTE) lozenge 4 mg  4 mg Buccal Q1H PRN Oliver Hum, MD        NORepinephrine 8 mg in dextrose 5 % 250 mL (32 mcg/mL) infusion PMB  0-10 mcg/min Intravenous Continuous Lala Lund, ACNP 3.8 mL/hr at 03/05/24 1034 2 mcg/min at 03/05/24 1034    ondansetron (ZOFRAN) injection 4 mg  4 mg Intravenous Q6H PRN Oliver Hum, MD        pantoprazole (Protonix) injection 40 mg  40 mg Intravenous Daily Oliver Hum, MD   40 mg at 03/05/24 1034    piperacillin-tazobactam (ZOSYN) 3.375 g in sodium chloride 0.9 % (NS) 100 mL IVPB-MBP  3.375 g Intravenous Q12H Oliver Hum, MD   Stopped at 03/05/24 1034    polyethylene glycol (MIRALAX) packet 17 g  17 g Oral Daily Kodali, Resha A, MD   17 g at 03/05/24 1034    sennosides (SENOKOT) oral syrup  2.5 mL Oral Nightly Kodali, Resha A, MD Facility-Administered Medications Ordered in Other Encounters   Medication Dose Route Frequency Provider Last Rate Last Admin    EPINEPHrine 100 mcg/10 mL (10 mcg/mL) injection   Intravenous PRN (once a day) Ichite, Precious C, MD   10 mcg at 02/28/24 1511    Propofol (DIPRIVAN) injection   Intravenous PRN (once a day) Ichite, Precious C, MD   60 mg at 02/28/24 1507    succinylcholine (ANECTINE) injection   Intravenous PRN (once a day) Ichite, Precious C, MD   60 mg at 02/28/24 1507       Allergies:  Allergies   Allergen Reactions    Lisinopril Anaphylaxis and Swelling    Losartan Dizziness    Hctz [Hydrochlorothiazide]      SIADH       Social History:  Social History     Tobacco Use    Smoking status: Every Day     Current packs/day: 1.00     Average packs/day: 1 pack/day for 65.9 years (65.9 ttl pk-yrs)     Types: Cigarettes     Start date: 04/09/1958    Smokeless tobacco: Never    Tobacco comments:     15cpd   Vaping Use    Vaping status: Never Used   Substance Use Topics    Alcohol use: No     Alcohol/week: 0.0 standard drinks of alcohol    Drug use: No       Family History:  Family History   Problem Relation Age of Onset    Breast cancer Daughter 28    Diabetes Mother     Glaucoma Father     Colon cancer Neg  Hx     Ovarian cancer Neg Hx     Endometrial cancer Neg Hx     Anesthesia problems Neg Hx     Bleeding Disorder Neg Hx        Review of Systems:  10 systems were reviewed and are negative except as noted specifically in the HPI.    Objective:     Intake/Output last 3 shifts:  I/O last 3 completed shifts:  In: 2601.9 [I.V.:1202.8; NG/GT:450; IV Piggyback:295.8]  Out: 2195 [Urine:915; Emesis/NG output:60; Drains:1220]  Vital signs in last 24 hours:  BP 97/51  - Pulse 126  - Temp 37.2 ??C (99 ??F) (Axillary)  - Resp 21  - Ht 154.9 cm (5' 0.98)  - Wt 68.9 kg (151 lb 14.4 oz)  - SpO2 97%  - BMI 28.72 kg/m??     Physical Exam:  General:  Critically ill appearing  HEENT: ETT in place, NGT in place  Neck  Trachea midline, symmetrical  Lungs:   Ventilated on PSV  Cardiac: Tachycardic, in afib  Abdomen: Distended, abthera in place,  GU:   Ileal conduit, stoma is pink, foley in place, straw colored urine in ostomy bag  Extremities: Warm and well perfused  Neuro:             intubated, sedated    Most Recent Labs:  Recent Labs     Units 03/04/24  0340 03/04/24  1529 03/05/24  0325   WBC 10*9/L 14.1* 18.4* 21.9*   RBC 10*12/L 3.54* 3.62* 3.53*   HGB g/dL 57.8* 46.9* 9.6*   HCT % 30.5* 31.4* 29.7*   MCV fL 86.1 86.9 84.3   MCH pg 28.4 28.0 27.3   MCHC g/dL 62.9 52.8 41.3   RDW % 18.3* 18.4* 18.2*   PLT 10*9/L 198 215 228   MPV fL 8.8 9.1 9.4     Recent Labs     Units 03/04/24  0340 03/04/24  0851 03/04/24  1529 03/04/24  2044 03/05/24  0325 03/05/24  0840 03/05/24  1215   NA mmol/L 144   < > 146*   < > 143 143 143   K mmol/L 4.0   < > 3.8   < > 4.0 3.8 4.0   CL mmol/L 108*   < > 110*   < > 109* 110* 107   CO2 mmol/L 20.0   < > 17.0*   < > 18.0* 16.0* 16.0*   BUN mg/dL 85*   < > 77*   < > 95* 94* 97*   CREATININE mg/dL 2.44*   < > 0.10*   < > 3.71* 3.69* 3.87*   GLU mg/dL 272*   < > 536*   < > 644 165 200*   MG mg/dL 2.8*  --  2.6  --  2.8*  --   --     < > = values in this interval not displayed.     Recent Labs     Units 03/01/24  0335 03/03/24  2149   ALT U/L 12 9*   AST U/L 12 22   ALKPHOS U/L  --  58   ALBUMIN g/dL  --  2.4*   PROT g/dL  --  5.5*   BILITOT mg/dL  --  0.2*   BILIDIR mg/dL  --  0.34     Recent Labs     Units 03/04/24  1529 03/04/24  2318 03/05/24  0538   INR  1.06  --   --  APTT sec 29.3 61.9* 61.9*       Microbiology Data:  Blood Culture, Routine   Date Value Ref Range Status   03/03/2024 No Growth at 24 hours  Preliminary   03/03/2024 No Growth at 24 hours  Preliminary     Urine Culture, Comprehensive   Date Value Ref Range Status   02/26/2024 (A)  Final    Mixed Gram Positive/Gram Negative Organisms Isolated   05/10/2022 >100,000 CFU/mL Escherichia coli (A)  Final   05/10/2022 50,000 to 100,000 CFU/mL Pseudomonas aeruginosa (A)  Final     Comment:     Susceptibility Testing By Consultation Only   05/10/2022 50,000 to 100,000 CFU/mL Mixed Urogenital Flora (A)  Final   06/22/2020 (A)  Final    Mixed Gram Positive/Gram Negative Organisms Isolated   02/16/2020 (A)  Final    Mixed Gram Positive/Gram Negative Organisms Isolated   11/26/2016 >100,000 CFU/mL Klebsiella pneumoniae (A)  Final   11/26/2016 10,000 to 50,000 CFU/mL Pseudomonas aeruginosa (A)  Final     Comment:     Susceptibility Testing By Consultation Only   08/02/2013 (A)  Final    30,000 CFU/ML MIXED UROGENITAL FLORA  Reference Range: (Lower detectable limit 1000 cfu/ml)  (Lower detectable limit for Acute dysuria, Suprapubic tap,  and Post-prostatic massage specimen types = 100 cfu/ml)   08/02/2013   Final    >100,000 CFU/mL  Testing results predict SUSCEPTIBILITY for the oral agents  cefdinir, cefuroxime, and cephalexin when used for therapy of  uncomplicated UTIs due to this organism.   07/07/2013   Final    MIXED GRAM POSITIVE/NEGATIVE ORGANISMS ISOLATED  Reference Range: (Lower detectable limit 1000 cfu/ml)  (Lower detectable limit for Acute dysuria, Suprapubic tap,  and Post-prostatic massage specimen types = 100 cfu/ml)   09/17/2011 FINAL  MIXED UROGENITAL FLORA  Final     Comment:     Reference Range: (Lower detectable limit 1000 cfu/ml)  (Lower detectable limit for Acute dysuria, Suprapubic tap,  and Post-prostatic massage specimen types = 100 cfu/ml)   03/23/2011 FINAL  MIXED UROGENITAL FLORA  Final     Comment:     Reference Range: (Lower detectable limit 1000 cfu/ml)  (Lower detectable limit for Acute dysuria, Suprapubic tap,  and Post-prostatic massage specimen types = 100 cfu/ml)   03/18/2011 FINAL  NO GROWTH  Final     Comment:     Reference Range: (Lower detectable limit 1000 cfu/ml)  (Lower detectable limit for Acute dysuria, Suprapubic tap,  and Post-prostatic massage specimen types = 100 cfu/ml)     No results found for: ANACX  Most recent Urinalysis:  No results for input(s): LEUKOCYTESUR, NITRITE, RBCUA, WBCUA, SQUEPIU, BACTERIA in the last 168 hours.     Urinalysis History:  Leukocyte Esterase, UA   Date Value Ref Range Status   02/26/2024 Moderate (A) Negative Final   05/10/2022 Large (A) Negative Final   06/22/2020 Moderate (A) Negative Final   02/16/2020 Moderate (A) Negative Final   11/26/2016 Moderate (A) Negative Final   08/02/2013 +1 NEGATIVE Final   07/07/2013 +2 NEGATIVE Final   09/17/2011 +2 NEGATIVE Final   03/18/2011 NEGATIVE NEGATIVE Final     Nitrite, UA   Date Value Ref Range Status   02/26/2024 Negative Negative Final   05/10/2022 Positive (A) Negative Final   06/22/2020 Negative Negative Final   02/16/2020 Positive (A) Negative Final   11/26/2016 Negative Negative Final   08/02/2013 NEGATIVE NEGATIVE Final   07/07/2013 NEGATIVE NEGATIVE Final  09/17/2011 NEGATIVE NEGATIVE Final   03/18/2011 NEGATIVE NEGATIVE Final     RBC, UA   Date Value Ref Range Status   02/26/2024 42 (H) <=4 /HPF Final   05/10/2022 4 <=4 /HPF Final   06/22/2020 2 <=4 /HPF Final   02/16/2020 3 <4 /HPF Final   11/26/2016 21 (H) <4 /HPF Final     WBC, UA   Date Value Ref Range Status   02/26/2024 71 (H) 0 - 5 /HPF Final   05/10/2022 6 (H) 0 - 5 /HPF Final   06/22/2020 25 (H) 0 - 5 /HPF Final   02/16/2020 10 (H) 0 - 5 /HPF Final   11/26/2016 12 (H) 0 - 5 /HPF Final     Squam Epithel, UA   Date Value Ref Range Status   02/26/2024 3 0 - 5 /HPF Final   05/10/2022 1 0 - 5 /HPF Final   06/22/2020 <1 0 - 5 /HPF Final   02/16/2020 2 0 - 5 /HPF Final   11/26/2016 <1 0 - 5 /HPF Final   09/17/2011 3 /HPF Final   03/18/2011 <1 /HPF Final     Bacteria, UA   Date Value Ref Range Status   02/26/2024 Moderate (A) None Seen /HPF Final   05/10/2022 Few (A) None Seen /HPF Final   06/22/2020 Many (A) None Seen /HPF Final   02/16/2020 Few (A) None Seen /HPF Final   11/26/2016 Many (A) None Seen /HPF Final   08/02/2013 FEW  Final   07/07/2013 MANY  Final 03/18/2011 RARE  Final        Imaging:  XR Chest Portable  Result Date: 03/04/2024  EXAM: XR CHEST PORTABLE ACCESSION: [id number] UN REPORT DATE: 03/04/2024 8:27 AM     CLINICAL INDICATION: VENTILATOR/RESPIRATOR DEPENDENCE STATUS      TECHNIQUE: Single View AP Chest Radiograph.     COMPARISON: 03/02/2024, XR CHEST PORTABLE FINDINGS:     Unchanged support devices.     Unchanged bibasilar opacities. Upper lung predominant emphysematous changes. No pleural effusion or pneumothorax.     Unchanged cardiomediastinal silhouette which is partially obscured on the right lung base. Very large hiatal hernia.         Bibasilar opacities concerning for atelectasis with superimposed aspiration or pneumonia.    ECG 12 Lead  Result Date: 03/04/2024  ATRIAL FIBRILLATION WITH RAPID VENTRICULAR RESPONSE LOW VOLTAGE QRS SEPTAL INFARCT  (CITED ON OR BEFORE 21-Sep-2020) T WAVE ABNORMALITY, CONSIDER LATERAL ISCHEMIA ABNORMAL ECG WHEN COMPARED WITH ECG OF 03-Mar-2024 18:22, NO SIGNIFICANT CHANGE WAS FOUND    CTA Chest W Contrast  Result Date: 03/04/2024  EXAM: CTA CHEST W CONTRAST ACCESSION: [id number] UN REPORT DATE: 03/03/2024 11:02 PM     CLINICAL INDICATION: echo w/c/f Pulm emb     TECHNIQUE: Contiguous axial images were reconstructed through the chest following a single breath hold helical acquisition during the administration of intravenous contrast material. Images were reformatted in the coronal and sagittal planes. MIP slabs were also constructed.     COMPARISON: Same day prior chest radiograph and prior studies     FINDINGS:     PULMONARY ARTERIES: Filling defects are visualized in the distal left anterior segmental artery (4:158). Main pulmonary artery is enlarged, measuring 3.8 cm.     HEART AND VASCULATURE: Heart is enlarged. Dilated right ventricle. Moderate coronary artery calcifications. No pericardial effusion. Aorta is normal in caliber.     LUNGS, AIRWAYS, AND PLEURA: Endotracheal tube tip terminating in the midthoracic trachea. Biapical predominant centrilobular and  subpleural emphysema. Compressive atelectasis of the right greater than left lower lobes. Small right and trace left pleural effusions. No pneumothorax. Bibasilar atelectasis.     MEDIASTINUM AND LYMPH NODES: No enlarged intrathoracic, axillary, or supraclavicular lymph nodes. 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     CHEST WALL AND BONES: No chest wall abnormalities. Mild degenerative changes of the thoracic spine.     UPPER ABDOMEN: Enteric tube tip terminating in the gastric fundus.     OTHER: Right internal jugular port catheter tip is at the level of the superior cavoatrial junction.             1.  Filling defects visualized in the distal left anterior segmental artery are favored to represent acute pulmonary embolus. 2.  Enlarged right ventricle and dilated main pulmonary artery, favored chronic in the setting of pulmonary arterial hypertension. There may be some aspect of right heart strain due to acute pulmonary embolus 3.  Redemonstrated large Bochdalek hernia extending into the right greater than left hemithorax with compressive bibasilar atelectasis. 4.  Small right and trace left pleural effusions.     The findings of this study were discussed via telephone with DR. NARAYAN RAGHAVA by Dr. Laurice Record on 03/03/2024 11:59 PM.    ECG 12 Lead  Result Date: 03/03/2024  ATRIAL FIBRILLATION WITH RAPID VENTRICULAR RESPONSE LOW VOLTAGE QRS SEPTAL INFARCT  (CITED ON OR BEFORE 21-Sep-2020) T WAVE ABNORMALITY, CONSIDER LATERAL ISCHEMIA ABNORMAL ECG WHEN COMPARED WITH ECG OF 28-Feb-2024 13:16, T WAVE INVERSION NOW EVIDENT IN ANTERIOR LEADS    Echocardiogram Follow Up/Limited Echo  Result Date: 03/03/2024  Patient Info Name:     Tammy Braun Age:     82 years DOB:     Nov 08, 1941 Gender:     Female MRN:     [id number] Accession #:     [id number] UN Account #:     [id number] Ht:     155 cm Wt:     69 kg BSA:     1.74 m2 BP:     99 /     66 mmHg Exam Date:     03/03/2024 2:01 PM Admit Date:     02/26/2024     Exam Type:     ECHOCARDIOGRAM FOLLOW UP/LIMITED ECHO     Technical Quality:     Fair     Staff Sonographer:     Bryson Dames Reading Fellow:     Dian Situ Pistiolis     Study Info Indications      - post operative follow up TTE Procedure(s)   Limited 2D, color flow and Doppler transthoracic echocardiogram is performed.         Summary   1. The left ventricular systolic function is normal, LVEF is visually estimated at 65-70%.   2. Mitral annular calcification is present (moderate).   3. The right ventricle is moderately dilated in size, with moderately reduced systolic function.   4. Possible McConnell sign is noted- see details below.   5. There is massive tricuspid regurgitation.   6. There is mild-moderate pulmonary hypertension.   7. TR maximum velocity: 3.2 m/s  Estimated PASP: 49 mmHg.   8. The right atrium is moderately dilated in size.         Left Ventricle   The left ventricular systolic function is normal, LVEF is  visually estimated at 65-70%. The left ventricle is normal in size with upper normal wall thickness. Left ventricular diastolic function cannot be accurately assessed.     Right Ventricle   The right ventricle is moderately dilated in size, with moderately reduced systolic function. McConnell sign is noted- hypokinesia of right ventricular free wall with sparing of the apex (can be seen in acute pulmonary embolism)- clinical correlation is reocmmendded.         Left Atrium   The left atrium is relatively small in size.     Right Atrium   The right atrium is moderately dilated in size.         Aortic Valve   The aortic valve is not well visualized.     Mitral Valve   The mitral valve leaflets are mildly thickened with normal leaflet mobility. Mitral annular calcification is present (moderate). Tricuspid Valve   There is massive tricuspid regurgitation. The tricuspid valve leaflets are mildly thickened, with normal leaflet mobility. There is mild-moderate pulmonary hypertension. TR maximum velocity: 3.2 m/s  Estimated PASP: 49 mmHg.         Aorta   The aorta is not well visualized.     Inferior Vena Cava   Mechanical ventilation precludes the ability to accurately assess right atrial pressure.     Pericardium/Pleural   There is no pericardial effusion.     Other Findings   Rhythm: Tachycardia.         Ventricles ---------------------------------------------------------------------- Name                                 Value        Normal ----------------------------------------------------------------------     LV Dimensions 2D/MM ----------------------------------------------------------------------  IVS Diastolic Thickness (2D)                                1.1 cm       0.6-0.9 LVID Diastole (2D)                  3.4 cm       3.8-5.2  LVPW Diastolic Thickness (2D)                                1.1 cm       0.6-0.9 LVID Systole (2D)                   2.7 cm       2.2-3.5 LV Mass Index (2D Cubed)           66 g/m2         43-95  Relative Wall Thickness (2D)                                  0.65        <=0.42     RV Dimensions 2D/MM ----------------------------------------------------------------------  RV Basal Diastolic Dimension                           3.3 cm       2.5-4.1 TAPSE  1.0 cm         >=1.7     Tricuspid Valve ---------------------------------------------------------------------- Name                                 Value        Normal ----------------------------------------------------------------------     TV Regurgitation Doppler ---------------------------------------------------------------------- TR Peak Velocity                   3.2 m/s                   Estimated PAP/RSVP ---------------------------------------------------------------------- RA Pressure                         8 mmHg           <=5 RV Systolic Pressure               49 mmHg           <36         Report Signatures Finalized by Rudene Anda  MD on 03/03/2024 04:16 PM Resident Serafim M Pistiolis on 03/03/2024 03:57 PM    XR Chest Portable  Result Date: 03/03/2024  EXAM: XR CHEST PORTABLE ACCESSION: [id number] UN REPORT DATE: 03/03/2024 3:48 PM     CLINICAL INDICATION: NGT (CATHETER VASCULAR FIT & ADJ)      TECHNIQUE: Single View AP Chest Radiograph.     COMPARISON: Chest 03/03/2024 at 1:03 p.m.     FINDINGS:     Unchanged support devices.     Large hiatal hernia with associated bilateral lower lung atelectasis/consolidation, right greater than left. No pneumothorax. Small bilateral pleural effusions.     Normal heart size and mediastinal contours.             Large hiatal hernia with associated bilateral lower lung atelectasis/consolidation, right greater than left. Small bilateral pleural effusions.    XR Chest Portable  Result Date: 03/03/2024  EXAM: XR CHEST PORTABLE ACCESSION: [id number] UN REPORT DATE: 03/03/2024 1:47 PM     CLINICAL INDICATION: VENTILATOR/RESPIRATOR DEPENDENCE STATUS      TECHNIQUE: Single View AP Chest Radiograph.     COMPARISON: Chest 03/03/2024     FINDINGS:     Unchanged support devices.     Large hiatal hernia with associated bilateral lower lung atelectasis/consolidation, right greater than left. No pneumothorax. Small bilateral pleural effusions.     Normal heart size and mediastinal contours.             Large hiatal hernia with associated bilateral lower lung atelectasis/consolidation, right greater than left. Small bilateral pleural effusions.    PVL Venous Duplex Lower Extremity Bilateral  Result Date: 03/03/2024   Peripheral Vascular Lab     [address]   [place], [place] [address]  PVL VENOUS DUPLEX LOWER EXTREMITY BILATERAL Patient Demographics Pt. Name: DEANA KROCK Location: PVL Inpatient Bedside MRN:      [id number]          Sex:      F DOB: [birth year]        Age:      59 years  Study Information Authorizing         [phone] Tandy Gaw        Performed Time       03/02/2024 Provider Name       LARABEE  11:00:39 PM Ordering Physician  Dyanne Iha       Patient Location     [facility] Accession Number    [id number] UN         Technologist         Neita Garnet Diagnosis:                                 Glass blower/designer Ordered Reason For Exam: fever, tachy Indication: as noted above per ordering provider Risk Factors: Immobility and Cancer (h/o bladder cancer). Anticoagulation: (Heparin). Protocol The major deep veins from the inguinal ligament to the ankle are assessed for bilaterally for compressibility and color and spectral Doppler flow characteristics. The assessed veins include bilateral common femoral vein, femoral vein in the thigh, popliteal vein, and intramuscular calf veins. The iliac vein is assessed indirectly using Doppler waveform analysis. The great saphenous vein is assessed for compressibility at the saphenofemoral junction, and the small saphenous vein assessed for compressibility behind the knee.  Final Interpretation Right There is no evidence of DVT in the lower extremity. Left There is no evidence of DVT in the lower extremity.  Electronically signed by [id number] Tobi Bastos on 03/03/2024 at 1:23:20 PM.  -------------------------------------------------------------------------------- Right Duplex Findings All veins visualized appear fully compressible. Doppler flow signals demonstrate normal spontaneity, phasicity, and augmentation.  Left Duplex Findings All veins visualized appear fully compressible. Doppler flow signals demonstrate normal spontaneity, phasicity, and augmentation. Right Technical Summary No evidence of deep venous obstruction in the lower extremity. No indirect evidence of obstruction proximal to the inguinal ligament. Left Technical Summary No evidence of deep venous obstruction in the lower extremity. No indirect evidence of obstruction proximal to the inguinal ligament.  Final     PVL Venous Duplex Upper Extremity Bilateral  Result Date: 03/03/2024   Peripheral Vascular Lab     [address]   [place], [place] [address]  PVL VENOUS DUPLEX UPPER EXTREMITY BILATERAL Patient Demographics Pt. Name: JANISSA BERTRAM Location: PVL Inpatient Bedside MRN:      [id number]          Sex:      F DOB:      1941/11/10        Age:      67 years  Study Information Authorizing         [phone] Tandy Gaw        Performed Time       03/02/2024 Provider Name       LARABEE                                     10:40:58 PM Ordering Physician  Dyanne Iha       Patient Location     [facility] Accession Number    [id number] [facility]         Technologist         Neita Garnet Diagnosis:  Assisting                                            Technologist Ordered Reason For Exam: fever, tachy Other Indication: as noted above per ordering provider Risk Factors: Immobility and Cancer (h/o bladder cancer). Anticoagulation: (Heparin).  Final Interpretation Right No evidence of DVT detected in the central veins. Evidence of acute superficial venous obstruction in the basilic vein. This was a limited study. Left No evidence of DVT detected in the central veins or arm veins.  Electronically signed by [id number] Tobi Bastos on 03/03/2024 at 1:23:01 PM.  Examination Protocol The internal jugular, brachiocephalic, subclavian, and axillary, brachial, basilic and cephalic veins are routinely assessed on the requested limb. Spectral and Color Doppler data is the primary method for evaluating the brachiocephalic and subclavian veins. Venous compression is used to evaluate the internal jugular, axillary, and upper arm veins. If a unilateral exam is requested, a contralateral subclavian vein Doppler signal is required for comparison.  Limitations: Right CVC.  Duplex Findings Right No abnormality of venous architecture is observed in the central veins. All Doppler signals are appropriately pulsatile with ventilatory excursions. Color Doppler notes appropriate filling in all vessels. However, this examination was unable to visualize the internal jugular, brachiocephalic, and subclavian veins due to noted limitations. The basilic vein is partially compressible appearing softly echogenic. All other venous findings in the upper extremity are within normal limits. Left No abnormality of venous architecture is observed in the central veins. All Doppler signals are appropriately pulsatile with ventilatory excursions. Color Doppler notes appropriate filling in all vessels. The arm veins appear fully compressible.  Summary of Findings Right No evidence of obstruction was seen in the central veins. However, unable to visualize the internal jugular, brachiocephalic, and subclavian veins due to noted limitations. Evidence of acute obstruction is visualized in the basilic vein. All other venous findings in the upper extremity are within normal limits. Left No evidence of obstruction was seen in the central veins or arm veins.   Final     XR Abdomen Portable  Result Date: 03/03/2024  EXAM: XR ABDOMEN PORTABLE ACCESSION: [id number] UN REPORT DATE: 03/03/2024 9:44 AM     CLINICAL INDICATION: 83 years old with URFO      COMPARISON: 03/01/2024 abdominal radiograph and prior     TECHNIQUE: Supine view of the abdomen, 2 image(s)     FINDINGS: Slight interval advancement of the esophagogastric tube with the sideport projecting over the lower right lung compatible with known large hiatal hernia. Surgical clips project over the right upper quadrant, midline lower abdomen and pelvis. There is suture material within the right lower pelvis. There are 2 abdominal drains with 1 terminating within the right lower quadrant and the other within the pelvis coursing superiorly towards the central abdomen. Nonobstructive bowel gas pattern. Bibasilar atelectasis with scattered airspace opacities visualized right mid and lower lung. No acute osseous abnormality.         Interval exploratory laparotomy with abdominal closure and placement of 2 abdominal drains with positioning as described above. No unexpected radiopaque foreign body within the abdomen or pelvis.     ++++++++++++++++++++     The findings of this study were discussed via telephone with DR. Abelino Derrick RAFF by Dr. Marisa Severin on 03/03/2024 9:44 AM.     -----------------------------------------------  XR Chest Portable  Result Date: 03/02/2024  EXAM: XR CHEST PORTABLE ACCESSION: [id number] UN REPORT DATE: 03/02/2024 11:01 AM     CLINICAL INDICATION: VENTILATOR/RESPIRATOR DEPENDENCE STATUS      TECHNIQUE: Single View AP Chest Radiograph.     COMPARISON: 03/01/2024     FINDINGS:     Unchanged support devices.     Large hiatal hernia with associated bilateral lower lung atelectasis. No pneumothorax. Cannot exclude small pleural effusions.     Normal heart size and mediastinal contours.             Large hiatal hernia with associated bilateral lower lung atelectasis.

## 2024-03-05 NOTE — Unmapped (Signed)
 Pt has maintained on PRVC throughout the night no changes made. RRT will continue to monitor.

## 2024-03-05 NOTE — Unmapped (Signed)
 Adult Nutrition Brief Note      Visit Type: Follow-Up  Reason for Visit: Parenteral Nutrition    Plan:  Advance tube feeds: Recommend Vital AF 1.2 Cal at goal rate 60 mL/hr. This provides 1512 kcals, 95 g protein, 140 g carbohydrate, 68 g fat, 7 g fiber, 1021 mL free water, and meets 101% USRDI.  Start at goal  FWF 30ml q4hrs  Blood sugar management per team  Sign off nutrition support service     Interval Events:  Tolerating trickle tube feeds  No BM since admission  New blood cultures obtained 3/4-no growth to date  Stable AKI  Abd drains with out over 24hrs    Daily Estimated Nutrient Needs:  Energy: 1525  1830 kcals 22-25 kcal/kg using estimated dry weight, 61 kg (03/02/24 1026)]  Protein: 75-125 gm [1.5-2.0 gm/kg, 1.2-1.5 gm/kg using estimated dry weight, 61 kg (03/02/24 1026)]

## 2024-03-05 NOTE — Unmapped (Addendum)
 Pt moves her upper shoulders, slightly opens eyes when coughing and suctioning, other than that no movement yet from upper and lower extremities, Provider aware. UTA orientation status. Remains intubated with ETT at 40% FIO2, saturation maintaining. On Afib. Amio gtt infusing. Levo titrated to maintain MAP > 65. Afebrile. NPO. TF at 30cc/hr via NGT, tolerated well. No BM at this time.Urostomy in place; minimal UOP. JP drains intact. Repositioned every 2 hours.Safety maintained.    Problem: Adult Inpatient Plan of Care  Goal: Plan of Care Review  Outcome: Ongoing - Unchanged  Goal: Patient-Specific Goal (Individualized)  Outcome: Ongoing - Unchanged  Goal: Absence of Hospital-Acquired Illness or Injury  Outcome: Ongoing - Unchanged  Intervention: Identify and Manage Fall Risk  Recent Flowsheet Documentation  Taken 03/05/2024 0400 by Kerney Elbe, RN  Safety Interventions:   aspiration precautions   environmental modification   enteral feeding safety   bed alarm   fall reduction program maintained   family at bedside   lighting adjusted for tasks/safety   low bed  Taken 03/05/2024 0000 by Kerney Elbe, RN  Safety Interventions:   aspiration precautions   bed alarm   enteral feeding safety   environmental modification   fall reduction program maintained   lighting adjusted for tasks/safety   low bed  Taken 03/04/2024 2000 by Kerney Elbe, RN  Safety Interventions:   aspiration precautions   enteral feeding safety   environmental modification   fall reduction program maintained   family at bedside   lighting adjusted for tasks/safety   low bed  Intervention: Prevent Skin Injury  Recent Flowsheet Documentation  Taken 03/05/2024 0400 by Kerney Elbe, RN  Positioning for Skin: Right  Device Skin Pressure Protection:   absorbent pad utilized/changed   adhesive use limited   tubing/devices free from skin contact  Skin Protection:   adhesive use limited   incontinence pads utilized   tubing/devices free from skin contact   silicone foam dressing in place  Taken 03/05/2024 0200 by Kerney Elbe, RN  Positioning for Skin: Left  Taken 03/05/2024 0000 by Kerney Elbe, RN  Positioning for Skin: Right  Device Skin Pressure Protection:   absorbent pad utilized/changed   adhesive use limited   tubing/devices free from skin contact  Skin Protection:   adhesive use limited   incontinence pads utilized   silicone foam dressing in place   tubing/devices free from skin contact  Taken 03/04/2024 2200 by Kerney Elbe, RN  Positioning for Skin: Left  Taken 03/04/2024 2000 by Kerney Elbe, RN  Positioning for Skin: Right  Device Skin Pressure Protection:   absorbent pad utilized/changed   adhesive use limited   tubing/devices free from skin contact  Skin Protection:   adhesive use limited   incontinence pads utilized   silicone foam dressing in place   tubing/devices free from skin contact  Intervention: Prevent and Manage VTE (Venous Thromboembolism) Risk  Recent Flowsheet Documentation  Taken 03/05/2024 0400 by Kerney Elbe, RN  Anti-Embolism Device Type: SCD, Knee  Anti-Embolism Device Status: On  Anti-Embolism Device Location: BLE  Taken 03/05/2024 0200 by Kerney Elbe, RN  Anti-Embolism Device Type: SCD, Knee  Anti-Embolism Device Status: On  Anti-Embolism Device Location: BLE  Taken 03/05/2024 0000 by Kerney Elbe, RN  Anti-Embolism Device Type: SCD, Knee  Anti-Embolism Device Status: On  Anti-Embolism Device Location: BLE  Taken 03/04/2024 2200 by Kerney Elbe, RN  Anti-Embolism Device Type: SCD, Knee  Anti-Embolism Device Status: On  Anti-Embolism Device Location: BLE  Taken 03/04/2024  2000 by Kerney Elbe, RN  Anti-Embolism Device Type: SCD, Knee  Anti-Embolism Device Status: On  Anti-Embolism Device Location: BLE  Intervention: Prevent Infection  Recent Flowsheet Documentation  Taken 03/05/2024 0400 by Kerney Elbe, RN  Infection Prevention:   cohorting utilized   environmental surveillance performed   hand hygiene promoted   equipment surfaces disinfected   single patient room provided  Taken 03/05/2024 0000 by Kerney Elbe, RN  Infection Prevention:   environmental surveillance performed   hand hygiene promoted   equipment surfaces disinfected   rest/sleep promoted   single patient room provided  Taken 03/04/2024 2000 by Kerney Elbe, RN  Infection Prevention:   environmental surveillance performed   equipment surfaces disinfected   hand hygiene promoted   rest/sleep promoted   single patient room provided  Goal: Optimal Comfort and Wellbeing  Outcome: Ongoing - Unchanged  Goal: Readiness for Transition of Care  Outcome: Ongoing - Unchanged  Goal: Rounds/Family Conference  Outcome: Ongoing - Unchanged

## 2024-03-06 LAB — CBC
HEMATOCRIT: 29.2 % — ABNORMAL LOW (ref 34.0–44.0)
HEMATOCRIT: 28.1 % — ABNORMAL LOW (ref 34.0–44.0)
HEMOGLOBIN: 9.3 g/dL — ABNORMAL LOW (ref 11.3–14.9)
HEMOGLOBIN: 8.9 g/dL — ABNORMAL LOW (ref 11.3–14.9)
MEAN CORPUSCULAR HEMOGLOBIN CONC: 32 g/dL (ref 32.0–36.0)
MEAN CORPUSCULAR HEMOGLOBIN CONC: 31.8 g/dL — ABNORMAL LOW (ref 32.0–36.0)
MEAN CORPUSCULAR HEMOGLOBIN: 27.2 pg (ref 25.9–32.4)
MEAN CORPUSCULAR HEMOGLOBIN: 26.9 pg (ref 25.9–32.4)
MEAN CORPUSCULAR VOLUME: 84.9 fL (ref 77.6–95.7)
MEAN CORPUSCULAR VOLUME: 84.5 fL (ref 77.6–95.7)
MEAN PLATELET VOLUME: 9.7 fL (ref 6.8–10.7)
MEAN PLATELET VOLUME: 9.7 fL (ref 6.8–10.7)
PLATELET COUNT: 303 10*9/L (ref 150–450)
PLATELET COUNT: 297 10*9/L (ref 150–450)
RED BLOOD CELL COUNT: 3.43 10*12/L — ABNORMAL LOW (ref 3.95–5.13)
RED BLOOD CELL COUNT: 3.33 10*12/L — ABNORMAL LOW (ref 3.95–5.13)
RED CELL DISTRIBUTION WIDTH: 18.8 % — ABNORMAL HIGH (ref 12.2–15.2)
RED CELL DISTRIBUTION WIDTH: 18.6 % — ABNORMAL HIGH (ref 12.2–15.2)
WBC ADJUSTED: 22 10*9/L — ABNORMAL HIGH (ref 3.6–11.2)
WBC ADJUSTED: 22.1 10*9/L — ABNORMAL HIGH (ref 3.6–11.2)

## 2024-03-06 LAB — BASIC METABOLIC PANEL
ANION GAP: 18 mmol/L — ABNORMAL HIGH (ref 5–14)
ANION GAP: 18 mmol/L — ABNORMAL HIGH (ref 5–14)
ANION GAP: 19 mmol/L — ABNORMAL HIGH (ref 5–14)
ANION GAP: 18 mmol/L — ABNORMAL HIGH (ref 5–14)
ANION GAP: 20 mmol/L — ABNORMAL HIGH (ref 5–14)
ANION GAP: 19 mmol/L — ABNORMAL HIGH (ref 5–14)
BLOOD UREA NITROGEN: 106 mg/dL — ABNORMAL HIGH (ref 9–23)
BLOOD UREA NITROGEN: 105 mg/dL — ABNORMAL HIGH (ref 9–23)
BLOOD UREA NITROGEN: 108 mg/dL — ABNORMAL HIGH (ref 9–23)
BLOOD UREA NITROGEN: 106 mg/dL — ABNORMAL HIGH (ref 9–23)
BLOOD UREA NITROGEN: 106 mg/dL — ABNORMAL HIGH (ref 9–23)
BLOOD UREA NITROGEN: 109 mg/dL — ABNORMAL HIGH (ref 9–23)
BUN / CREAT RATIO: 26
BUN / CREAT RATIO: 25
BUN / CREAT RATIO: 25
BUN / CREAT RATIO: 25
BUN / CREAT RATIO: 25
BUN / CREAT RATIO: 26
CALCIUM: 8.8 mg/dL (ref 8.7–10.4)
CALCIUM: 8.9 mg/dL (ref 8.7–10.4)
CALCIUM: 8.9 mg/dL (ref 8.7–10.4)
CALCIUM: 9 mg/dL (ref 8.7–10.4)
CALCIUM: 9 mg/dL (ref 8.7–10.4)
CALCIUM: 8.7 mg/dL (ref 8.7–10.4)
CHLORIDE: 107 mmol/L (ref 98–107)
CHLORIDE: 107 mmol/L (ref 98–107)
CHLORIDE: 106 mmol/L (ref 98–107)
CHLORIDE: 107 mmol/L (ref 98–107)
CHLORIDE: 106 mmol/L (ref 98–107)
CHLORIDE: 106 mmol/L (ref 98–107)
CO2: 17 mmol/L — ABNORMAL LOW (ref 20.0–31.0)
CO2: 18 mmol/L — ABNORMAL LOW (ref 20.0–31.0)
CO2: 17 mmol/L — ABNORMAL LOW (ref 20.0–31.0)
CO2: 17 mmol/L — ABNORMAL LOW (ref 20.0–31.0)
CO2: 16 mmol/L — ABNORMAL LOW (ref 20.0–31.0)
CO2: 17 mmol/L — ABNORMAL LOW (ref 20.0–31.0)
CREATININE: 4.13 mg/dL — ABNORMAL HIGH (ref 0.55–1.02)
CREATININE: 4.24 mg/dL — ABNORMAL HIGH (ref 0.55–1.02)
CREATININE: 4.31 mg/dL — ABNORMAL HIGH (ref 0.55–1.02)
CREATININE: 4.25 mg/dL — ABNORMAL HIGH (ref 0.55–1.02)
CREATININE: 4.18 mg/dL — ABNORMAL HIGH (ref 0.55–1.02)
CREATININE: 4.22 mg/dL — ABNORMAL HIGH (ref 0.55–1.02)
EGFR CKD-EPI (2021) FEMALE: 10 mL/min/{1.73_m2} — ABNORMAL LOW (ref >=60)
EGFR CKD-EPI (2021) FEMALE: 10 mL/min/{1.73_m2} — ABNORMAL LOW (ref >=60)
EGFR CKD-EPI (2021) FEMALE: 10 mL/min/{1.73_m2} — ABNORMAL LOW (ref >=60)
EGFR CKD-EPI (2021) FEMALE: 10 mL/min/{1.73_m2} — ABNORMAL LOW (ref >=60)
EGFR CKD-EPI (2021) FEMALE: 10 mL/min/{1.73_m2} — ABNORMAL LOW (ref >=60)
EGFR CKD-EPI (2021) FEMALE: 10 mL/min/{1.73_m2} — ABNORMAL LOW (ref >=60)
GLUCOSE RANDOM: 225 mg/dL — ABNORMAL HIGH (ref 70–179)
GLUCOSE RANDOM: 238 mg/dL — ABNORMAL HIGH (ref 70–179)
GLUCOSE RANDOM: 219 mg/dL — ABNORMAL HIGH (ref 70–99)
GLUCOSE RANDOM: 205 mg/dL — ABNORMAL HIGH (ref 70–99)
GLUCOSE RANDOM: 208 mg/dL — ABNORMAL HIGH (ref 70–179)
GLUCOSE RANDOM: 199 mg/dL — ABNORMAL HIGH (ref 70–99)
POTASSIUM: 4.2 mmol/L (ref 3.4–4.8)
POTASSIUM: 4 mmol/L (ref 3.4–4.8)
POTASSIUM: 4 mmol/L (ref 3.4–4.8)
POTASSIUM: 4.1 mmol/L (ref 3.4–4.8)
POTASSIUM: 4.1 mmol/L (ref 3.4–4.8)
POTASSIUM: 4 mmol/L (ref 3.4–4.8)
SODIUM: 142 mmol/L (ref 135–145)
SODIUM: 143 mmol/L (ref 135–145)
SODIUM: 142 mmol/L (ref 135–145)
SODIUM: 142 mmol/L (ref 135–145)
SODIUM: 142 mmol/L (ref 135–145)
SODIUM: 142 mmol/L (ref 135–145)

## 2024-03-06 LAB — APTT
APTT: 48 s — ABNORMAL HIGH (ref 24.8–38.4)
HEPARIN CORRELATION: 0.3

## 2024-03-06 LAB — BLOOD GAS CRITICAL CARE PANEL, ARTERIAL
BASE EXCESS ARTERIAL: -9.1 — ABNORMAL LOW (ref -2.0–2.0)
CALCIUM IONIZED ARTERIAL (MG/DL): 4.68 mg/dL (ref 4.40–5.40)
GLUCOSE WHOLE BLOOD: 213 mg/dL — ABNORMAL HIGH (ref 70–179)
HCO3 ARTERIAL: 16 mmol/L — ABNORMAL LOW (ref 22–27)
HEMOGLOBIN BLOOD GAS: 8.6 g/dL — ABNORMAL LOW (ref 12.00–16.00)
LACTATE BLOOD ARTERIAL: 1 mmol/L (ref <1.3)
O2 SATURATION ARTERIAL: 98.6 % (ref 94.0–100.0)
PCO2 ARTERIAL: 32.4 mmHg — ABNORMAL LOW (ref 35.0–45.0)
PH ARTERIAL: 7.31 — ABNORMAL LOW (ref 7.35–7.45)
PO2 ARTERIAL: 117 mmHg — ABNORMAL HIGH (ref 80.0–110.0)
POTASSIUM WHOLE BLOOD: 3.9 mmol/L (ref 3.4–4.6)
SODIUM WHOLE BLOOD: 144 mmol/L (ref 135–145)

## 2024-03-06 LAB — IONIZED CALCIUM VENOUS: CALCIUM IONIZED VENOUS (MG/DL): 4.65 mg/dL (ref 4.40–5.40)

## 2024-03-06 LAB — MAGNESIUM
MAGNESIUM: 2.8 mg/dL — ABNORMAL HIGH (ref 1.6–2.6)
MAGNESIUM: 2.8 mg/dL — ABNORMAL HIGH (ref 1.6–2.6)
MAGNESIUM: 2.7 mg/dL — ABNORMAL HIGH (ref 1.6–2.6)

## 2024-03-06 LAB — PHOSPHORUS
PHOSPHORUS: 4.8 mg/dL (ref 2.4–5.1)
PHOSPHORUS: 5.3 mg/dL — ABNORMAL HIGH (ref 2.4–5.1)

## 2024-03-06 LAB — AMMONIA: AMMONIA: 23 umol/L (ref 11–32)

## 2024-03-06 MED ADMIN — aspirin chewable tablet 81 mg: 81 mg | GASTROENTERAL | @ 14:00:00

## 2024-03-06 MED ADMIN — bisacodyl (DULCOLAX) suppository 10 mg: 10 mg | RECTAL | @ 16:00:00 | Stop: 2024-03-06

## 2024-03-06 MED ADMIN — iohexol (OMNIPAQUE) 240 mg iodine/mL solution 100 mL: 100 mL | INTRAVENOUS | @ 19:00:00 | Stop: 2024-03-06

## 2024-03-06 MED ADMIN — albumin human 5 % 12.5 g: 12.5 g | INTRAVENOUS | @ 07:00:00 | Stop: 2024-03-06

## 2024-03-06 MED ADMIN — insulin regular (HumuLIN,NovoLIN) injection 0-20 Units: 0-20 [IU] | SUBCUTANEOUS | @ 12:00:00

## 2024-03-06 MED ADMIN — lactated ringers bolus 500 mL: 500 mL | INTRAVENOUS | @ 01:00:00 | Stop: 2024-03-05

## 2024-03-06 MED ADMIN — sennosides (SENOKOT) oral syrup: 2.5 mL | ORAL | @ 03:00:00

## 2024-03-06 MED ADMIN — acetaminophen (OFIRMEV) 10 mg/mL injection 1,000 mg: 1000 mg | INTRAVENOUS | @ 22:00:00 | Stop: 2024-03-07

## 2024-03-06 MED ADMIN — insulin regular (HumuLIN,NovoLIN) injection 0-20 Units: 0-20 [IU] | SUBCUTANEOUS | @ 05:00:00

## 2024-03-06 MED ADMIN — meropenem (MERREM) 500 mg in sodium chloride 0.9 % (NS) 100 mL IVPB-MBP: 500 mg | INTRAVENOUS | @ 16:00:00 | Stop: 2024-03-06

## 2024-03-06 MED ADMIN — polyethylene glycol (MIRALAX) packet 17 g: 17 g | ORAL | @ 14:00:00

## 2024-03-06 MED ADMIN — amiodarone 360 mg in dextrose 5% 200 mL (1.8 mg/mL) infusion PMB: .5 mg/min | INTRAVENOUS | @ 22:00:00 | Stop: 2024-03-09

## 2024-03-06 MED ADMIN — heparin 25,000 Units/250 mL (100 units/mL) in 0.45% saline infusion (premade): 0-24 [IU]/kg/h | INTRAVENOUS | @ 01:00:00

## 2024-03-06 MED ADMIN — amiodarone 360 mg in dextrose 5% 200 mL (1.8 mg/mL) infusion PMB: .5 mg/min | INTRAVENOUS | @ 08:00:00 | Stop: 2024-03-09

## 2024-03-06 MED ADMIN — budesonide (PULMICORT) nebulizer solution 0.5 mg: .5 mg | RESPIRATORY_TRACT | @ 13:00:00

## 2024-03-06 MED ADMIN — budesonide (PULMICORT) nebulizer solution 0.5 mg: .5 mg | RESPIRATORY_TRACT | @ 02:00:00

## 2024-03-06 MED ADMIN — acetaminophen (OFIRMEV) 10 mg/mL injection 1,000 mg: 1000 mg | INTRAVENOUS | @ 16:00:00 | Stop: 2024-03-07

## 2024-03-06 MED ADMIN — pantoprazole (Protonix) injection 40 mg: 40 mg | INTRAVENOUS | @ 14:00:00

## 2024-03-06 MED ADMIN — ipratropium (ATROVENT) 0.02 % nebulizer solution 500 mcg: 500 ug | RESPIRATORY_TRACT | @ 14:00:00

## 2024-03-06 MED ADMIN — acetaminophen (TYLENOL) oral liquid: 1000 mg | GASTROENTERAL | @ 03:00:00

## 2024-03-06 MED ADMIN — ipratropium (ATROVENT) 0.02 % nebulizer solution 500 mcg: 500 ug | RESPIRATORY_TRACT | @ 02:00:00

## 2024-03-06 MED ADMIN — ipratropium (ATROVENT) 0.02 % nebulizer solution 500 mcg: 500 ug | RESPIRATORY_TRACT | @ 21:00:00

## 2024-03-06 MED ADMIN — meropenem (MERREM) 500 mg in sodium chloride 0.9 % (NS) 100 mL IVPB-MBP: 500 mg | INTRAVENOUS | @ 05:00:00 | Stop: 2024-03-06

## 2024-03-06 MED ADMIN — ipratropium (ATROVENT) 0.02 % nebulizer solution 500 mcg: 500 ug | RESPIRATORY_TRACT | @ 09:00:00

## 2024-03-06 MED ADMIN — lactated ringers bolus 500 mL: 500 mL | INTRAVENOUS | @ 10:00:00 | Stop: 2024-03-06

## 2024-03-06 NOTE — Unmapped (Signed)
 [facility] PROGRESS NOTE     Date of Service: 03/06/2024    Hospital Day: LOS: 8 days        Surgery Date: TBD   Surgical Attending: Merri Ray, MD    Critical Care Attending: Thalia Party, MD    Interval History:   Low urine output noted. 1L LR and 250cc of 5% albumin given. Not very responsive to fluid or colloid. Zosyn discontinued and meropenem started. On and off low dose levo. Neuro status remains unchanged.     History of Present Illness:   Tammy Braun is a 83 y.o. female with PMH bladder cancer with cystectomy and ileal conduit, COPD, recent dx of afib, moderate-severe TR, CAD (high calcification score), T2DM presented to [facility] with projectile vomiting and abdominal pain, found to have SBO with transition point at level of ileostomy on CT A/P. On initial presentation, labs remarkable for leukocytosis thought to be iso hemoconcentration, AKI on CKD, mildly elevated venous lactate improved with resuscitation. Ad[facility] for worsening hypoxia on HFNC requiring intubation in setting of hiatal hernia. OR on 02/28/24 for ex-lap, reduciton of parastomal hernia, small bowel resection with primary anastomosis, lysis of adhesions, abdomen left open with ABThera in place.     Hospital Course:  - 02/26/24: Admitted to [facility], floor status, consult to urology for possible ileal conduit revision.   - 02/27/24: Patient level of care escalated to step-down iso increased oxygen needs while in ED.  - 02/28/24: Upgraded to ICU. Intubated at bed-side. OR for ex-lap, reduction of parastomal hernia, small bowel resection with primary anastomosis, lysis of adhesions, abdomen left open with ABThera in place.   - 03/01/24: OR for attempt at abdominal closure. Left open due to desaturations during closure attempt. ABThera in place.  - 03/03/24: RTOR for abdominal closure.     ASSESSMENT & PLAN:     Neurologic:  - Pain: HM IV PRN 0.5-1mg , tylenol    > start additional MMPC agents when neurostatus improves  - Sedation: none    #AMS, somnolence  - Differentials include stroke (ischemic vs hemorrhagic - low suspicion), metabolic encephalopathy 2/2 drug induced, vs uremia, vs infection   - Off sedation since 3/4   - GCS 6T  - Obtain Head CT STAT  - CT chest, abd, pelvis  - Neph consult for possible dialysis   - Hold all sedating drugs     Cardiovascular:  #HTN, HLD:   - Chronic.    - MAP goal > 65    > requiring NE, low dose, wean as able  - Hold home ASA and Lipitor while NPO    #Afib/flutter w RVR:   - Prior hx of paroxysmal afib. CHADSvasc = 6, not anticoagulated PTA per chart review.   - Continue amio gtt, heparin gtt started iso PE   - Holding home diltiazem, question efficacy given likely poor absorption  - Amio load will complete Monday 3/10 AM - after load, start either 150mg  IV amio daily or 200mg  PO daily (if OK to use gut)  - Echo 3/1: LVEF > 55%. Severe TR.  - Pro-BNP: 6,988 on 3/3     #Pulmonary embolism  - 3/5 CTA chest: Filling defect in L anterior segmental artery  - heparin gtt     #Acute thrombus right basilic   - Identified 3/3 on BUE duplex.  - Supportive care, superficial, AC not required     Respiratory:  #Acute on chronic hypoxemia  #Hx of COPD  - 2/28:  Intubated  - Continue pulmonary toilet  - Home incruse ellipta held  - Daily SBT  - Daily ABG    #Pseudomonas Pneumonia   #VAP  - see ID for abx plan      FEN/GI:  F: Medlocked, bolus PRN  E: Replete electrolytes PRN  N: NPO   - TFs held overnight 2/2 emesis, NG to LIWS  - GI prophylaxis: zofran prn for nausea; IV Protonix   - bowel regimen: hold miralax, senna in setting off possible ileus ; give suppository     #S/p ex-lap, reduction of parastomal hernia, small bowel resection with primary anastomosis, lysis of adhesions, abdomen left open with ABThera in place 2/28    #Large hiatal hernia, chronic  - Non-operative, chronic  - re-eval on CT chest/abd/pelvis    Renal/Genitourinary:  #AKI on CKD, prerenal:   - Cr continues to uptrend  - Progressing oliguria  - RUS completed: Bilateral renal cysts measuring up to 2.6 cm in the right upper pole and 1.9 cm left interpolar kidney   - Plan for nephro consult today -plan to  start CRRT for uremia    #Hydronephrosis:  - Caution with perioperative fluid resuscitation per Cards   - RUS completed:  No hydronephrosis.     #H/o bladder cancer s/p radical cystectomy with ileal conduit (2012):  #Parastomal hernia:  - SRU consulted, signed off 3/7  - Strict I&Os  - Foley catheter per stoma, if it falls out, please notify Urology to replace      Endocrine:   #hyperglycemia   - Glycemic Control: SSI    Hematologic:   - Monitor CBC daily  - Hb stable. No concerns for active bleeding.      Immunologic/Infectious Disease:  - Afebrile, leukocytosis to 22  - Cultures:   - Ucx (2/26): Mixed GP/GN organisms    - RPP (2/27): negative   - Blood cultures (3/4): NGTD @48h    - BAL (3/4): Pseudomonas aeruginosa  - Abx:    - Zosyn (3/1 - 3/5) for abdominal contamination  - Zosyn (3/6) for pseudomonas when sensitivities pending  - Meropenum started 3/6 for Psedumonas PNA- susceptibilities confirm sensitives       Musculoskeletal:   NAI    Daily Care Checklist:   - Stress Ulcer Prevention: Yes: Mechanically ventilated  - DVT Prophylaxis: Chemical: Heparin drip  - Daily Awakening: Yes  - Spontaneous Breathing Trial: Yes  - Indication for Central/PICC Line: Yes  Infusions requiring central access, Hemodynamic monitoring, and Inadequate peripheral access, RIJ triple lumen  - Indication for Urinary Catheter: Yes  Strict intake and output, Agressive diuresis/hydration, and Urinary retention/obstruction  - Diagnostic images/reports of past 24hrs reviewed: Yes    Disposition:   - Continue ICU care  - PT/OT consulted:  will consult post extubation    Tammy Braun is critically ill due to acute kidney injury, altered mental status, anemia - acute blood loss, and atrial fibrillation, SBO, open abdomen, postoperative mechanical ventilation. This critical care time includes examining the patient, evaluating the hemodynamic, laboratory, and radiographic data, independently developing a comprehensive management plan, and serially assessing the patient's response to these critical care interventions. This critical care time excludes procedures.     Critical care time: 90 minutes     Katina Degree, ACNP   Surgical Intensive Care Unit  [place] [facility] [place] [facility]       SUBJECTIVE:      Intubated/sedated     OBJECTIVE:  Physical Exam:  Constitutional: Lying supine, NAD  Neurologic: Grimaces to painful stimuli, turns head to verbal. Pupils equal and responsive to light.   Respiratory: Mechanically ventilated via ETT   Cardiovascular: Tachycardic, irregular rhythm, MAPs > 65 on NE@1   Gastrointestinal: Mildly distended abdomen, midline incision dressing c/d/i. Urostomy with Foley in stoma draining clear yellow urine.   Musculoskeletal: Trace to no BLE/BUE edema.  Skin: Warm and dry     Temp:  [36.4 ??C (97.5 ??F)-37.2 ??C (99 ??F)] 37.1 ??C (98.8 ??F)  Heart Rate:  [107-133] 107  SpO2 Pulse:  [101-154] 120  Resp:  [18-30] 20  BP: (85-122)/(51-79) 94/55  MAP (mmHg):  [64-91] 64  A BP-1: (82-134)/(51-87) 88/51  MAP:  [63 mmHg-105 mmHg] 65 mmHg  FiO2 (%):  [40 %] 40 %  SpO2:  [96 %-100 %] 99 %       Recent Laboratory Results:  Recent Labs     Units 03/06/24  0904   PHART  7.31*   PCO2ART mm Hg 32.4*   PO2ART mm Hg 117.0*   HCO3ART mmol/L 16*   BEART  -9.1*   O2SATART % 98.6     Recent Labs     Units 03/05/24  2223 03/06/24  0008 03/06/24  0627 03/06/24  0904   NA mmol/L 143 142 143 144   K mmol/L 4.1 4.2 4.0 3.9   CL mmol/L 108* 107 107  --    CO2 mmol/L 17.0* 17.0* 18.0*  --    BUN mg/dL 98* 454* 098*  --    CREATININE mg/dL 1.19* 1.47* 8.29*  --    GLU mg/dL 562* 130* 865*  --      Lab Results   Component Value Date    BILITOT 0.2 (L) 03/03/2024    BILITOT 0.5 02/26/2024    BILIDIR 0.10 03/03/2024    ALT 9 (L) 03/03/2024    ALT 12 03/01/2024    AST 22 03/03/2024    AST 12 03/01/2024 GGT 16 09/17/2011    GGT 354 (H) 05/03/2011    ALKPHOS 58 03/03/2024    ALKPHOS 101 02/26/2024    PROT 5.5 (L) 03/03/2024    PROT 9.2 (H) 02/26/2024    ALBUMIN 2.4 (L) 03/03/2024    ALBUMIN 4.5 02/26/2024     Recent Labs     Units 03/05/24  1708 03/05/24  2359 03/06/24  0637 03/06/24  0903   POCGLU mg/dL 784* 696* 295* 284*     Recent Labs     Units 03/05/24  0325 03/05/24  1553 03/06/24  0627   WBC 10*9/L 21.9* 24.7* 22.0*   RBC 10*12/L 3.53* 3.41* 3.43*   HGB g/dL 9.6* 9.4* 9.3*   HCT % 29.7* 29.0* 29.2*   MCV fL 84.3 84.8 84.9   MCH pg 27.3 27.5 27.2   MCHC g/dL 13.2 44.0 10.2   RDW % 18.2* 18.6* 18.8*   PLT 10*9/L 228 256 303   MPV fL 9.4 9.6 9.7     Recent Labs     Units 03/06/24  0627   APTT sec 48.0*      Lines & Tubes:   Patient Lines/Drains/Airways Status       Active Peripheral & Central Intravenous Access       Name Placement date Placement time Site Days    Peripheral IV 03/04/24 Anterior;Proximal;Right Forearm 03/04/24  0021  Forearm  2    CVC Triple Lumen 02/28/24 Non-tunneled Right Internal jugular 02/28/24  1641  Internal jugular  6                     Patient Lines/Drains/Airways Status       Active Wounds       Name Placement date Placement time Site Days    Surgical Site 04/08/18 Shoulder Left 04/08/18  1338  -- 2158    Surgical Site 06/05/22 Eye Left 06/05/22  0928  -- 640    Surgical Site 06/19/22 Eye Right 06/19/22  1024  -- 626    Surgical Site 03/03/24 Abdomen 03/03/24  1013  -- 2    Wound 06/24/20 Other (comment) Buttocks inner right buttock 06/24/20  1010  Buttocks  1351                     Respiratory/ventilator settings for last 24 hours:   Vent Mode: PRVC  FiO2 (%): 40 %  S RR: 18  S VT: 300 mL  PEEP: 8 cm H20  PR SUP: 10 cm H20    Intake/Output last 3 shifts:  I/O last 3 completed shifts:  In: 3106.5 [I.V.:1333; NG/GT:1350; IV Piggyback:114.2]  Out: 1790 [Urine:580; Drains:1210]    Daily/Recent Weight:  68.9 kg (151 lb 14.4 oz)    BMI:  Body mass index is 28.72 kg/m??.          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    Chronic kidney disease     Depression, psychotic (CMS-HCC)     Diabetes mellitus (CMS-HCC)     in past    Emphysema of lung (CMS-HCC)     Financial difficulties     Frail elderly     Hearing impairment     Hernia     History of transfusion     Hyperlipidemia     Hypertension     Impaired mobility     Osteoporosis     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/08/2015    Visual impairment      Past Surgical History:   Procedure Laterality Date    ABDOMINAL SURGERY      BLADDER SURGERY      BREAST CYST EXCISION      CHEMOTHERAPY  2012    bladder    GALLBLADDER SURGERY      stone removal    ILEOSTOMY  2012    PR COLONOSCOPY FLX DX W/COLLJ SPEC WHEN PFRMD N/A 02/09/2015    Procedure: COLONOSCOPY, FLEXIBLE, PROXIMAL TO SPLENIC FLEXURE; DIAGNOSTIC, W/WO COLLECTION SPECIMEN BY BRUSH OR WASH;  Surgeon: Dewaine Conger, MD;  Location: [facility] [place];  Service: Gastroenterology    PR EXPLORATORY OF ABDOMEN N/A 02/28/2024    Procedure: EXPLORATORY LAPAROTOMY, EXPLORATORY CELIOTOMY WITH OR WITHOUT BIOPSY(S);  Surgeon: Renda Rolls, MD;  Location: [facility];  Service: General Surgery    PR RECONSTR TOTAL SHOULDER IMPLANT Left 04/08/2018    Procedure: ARTHROPLASTY, GLENOHUMERAL JOINT; TOTAL SHOULDER(GLENOID & PROXIMAL HUMERAL REPLACEMENT(EG, TOTAL SHOULDER);  Surgeon: Tomasa Rand, MD;  [facility]: [facility] OR [facility];  Service: Ortho Sports Medicine    PR REOPEN RECENT ABD EXPLORATORY Midline 03/01/2024    Procedure: REOPENING OF RECENT LAPAROTOMY;  Surgeon: [name]., MD;  Location: OR [facility];  Service: Trauma  PR REOPEN RECENT ABD EXPLORATORY N/A 03/03/2024    Procedure: REOPENING OF RECENT LAPAROTOMY;  Surgeon: Joanie Coddington, MD;  Location: OR [facility];  Service: Trauma    PR SIGMOIDOSCOPY,BIOPSY N/A 03/11/2015    Procedure: SIGMOIDOSCOPY, FLEXIBLE; WITH BIOPSY, SINGLE OR MULTIPLE;  Surgeon: Wilburt Finlay, MD;  Location: [facility];  Service: Gastroenterology    PR XCAPSL CTRC RMVL INSJ IO LENS PROSTH W/O ECP Left 06/05/2022    Procedure: EXTRACAPSULAR CATARACT REMOVAL W/INSERTION OF INTRAOCULAR LENS PROSTHESIS, MANUAL OR MECHANICAL TECHNIQUE WITHOUT ENDOSCOPIC CYCLOPHOTOCOAGULATION;  Surgeon: Garner Gavel, MD;  Location: [facility] OR [facility];  Service: Ophthalmology    PR XCAPSL CTRC RMVL INSJ IO LENS PROSTH W/O ECP Right 06/19/2022    Procedure: EXTRACAPSULAR CATARACT REMOVAL W/INSERTION OF INTRAOCULAR LENS PROSTHESIS, MANUAL OR MECHANICAL TECHNIQUE WITHOUT ENDOSCOPIC CYCLOPHOTOCOAGULATION;  Surgeon: Garner Gavel, MD;  Location: [facility];  Service: Ophthalmology     Scheduled Medications:   acetaminophen  1,000 mg Enteral tube: gastric Q8H SCH    aspirin  81 mg Enteral tube: gastric Daily    atorvastatin  40 mg Enteral tube: gastric Daily    budesonide (PULMICORT) nebulizer solution  0.5 mg Nebulization BID (RT)    flu vacc [id number](16yr up)-PF  0.5 mL Intramuscular During hospitalization    [START ON 03/14/2024] fluPHENAZine decanoate  50 mg Intramuscular Q30 Days    insulin regular  0-20 Units Subcutaneous Q6H SCH    ipratropium  500 mcg Nebulization Q6H (RT)    meropenem  500 mg Intravenous Q12H    pantoprazole (Protonix) intravenous solution  40 mg Intravenous Daily    polyethylene glycol  17 g Oral Daily    sennosides  2.5 mL Oral Nightly     Continuous Infusions:   amiodarone 0.5 mg/min (03/06/24 0800)    heparin 12 Units/kg/hr (03/06/24 0800)    NORepinephrine bitartrate-NS 1 mcg/min (03/06/24 0914)     PRN Medications:  albuterol, [Provider Hold] cyclobenzaprine, dextrose in water, heparin (porcine), HYDROmorphone **OR** HYDROmorphone, nicotine polacrilex **OR** nicotine polacrilex, ondansetron

## 2024-03-06 NOTE — Unmapped (Signed)
 Nephrology Consult Note    Requesting Attending Physician :  Merri Ray, MD  Service Requesting Consult : Peter Garter [facility])  Reason for Consult: AKI on CKD stage 3A, CRRT needs    Assessment and Plan:    # oliguric AKI on CKD stage III A  HAGMA  Baseline creatinine 1-1.1, Consulted at 4.18  Etiology most likely ATN in the setting of poor hemodynamic instability (low blood pressures, profuse emesis), tachycardia, and may also have an element of contrast-induced nephropathy (CTA 3/4)  Urine output less than 500 cc for past 2 days, Foley in place  Ucx with limited utility in setting of ileal conduit  Renal US 3/6 with no hydronephrosis   Patient was given 500 cc x 2 of LR as well as albumin 5% without improvement in output.  ABX meropenem    Unfortunately no urine to assess at this time.  Recommend initiation of CRRT in the setting of oliguric AKI (progressive) as well as encephalopathy with elevated BUN(without other clear causes i.e. unremarkable CT head, stable vital signs, relatively stable metabolic workup aside from progressive renal disease, normal ammonia). Will monitor daily for renal recovery  Strict I/O  MAP>65  Limit renal toxicity- ie Contrast unless absolutely needed, ACE, ARB, NSAID    # SBO s/p exlap   Normocytic Anemia    - Evaluation and management per primary team  - No changes to management from a nephrology standpoint at this time    RECOMMENDATIONS:   Plan for CRRT   Strict I/O  MAP>65  Limit renal toxicity- ie Contrast unless absolutely needed, ACE, ARB, NSAID  - We will continue to follow.     Ambrose Mantle, MD  03/06/2024 11:36 AM     Medical decision-making for 03/06/24  Findings / Data     Patient has: []  acute illness w/systemic sxs  [mod]  []  two or more stable chronic illnesses [mod]  []  one chronic illness with acute exacerbation [mod]  []  acute complicated illness  [mod]  []  Undiagnosed new problem with uncertain prognosis  [mod] [x]  illness posing risk to life or bodily function (ex. AKI)  [high]  []  chronic illness with severe exacerbation/progression  [high]  []  chronic illness with severe side effects of treatment  [high] Aki on CKD stage 3A Probs At least 2:  Probs, Data, Risk   I reviewed: [x]  primary team note  [x]  consultant note(s)  []  external records [x]  chemistry results  [x]  CBC results  []  blood gas results  []  Other []  procedure/op note(s)   []  radiology report(s)  []  micro result(s)  []  w/ independent historian(s) Cr 4-4.5, bicarb 17 >=3 Data Review (2 of 3)    I independently interpreted: []  Urine Sediment  []  Renal US []  CXR Images  []  CT Images  []  Other []  EKG Tracing  Any     I discussed: []  Pathology results w/ QHPs(s) from other specialties  []  Procedural findings w/ QHPs(s) from other specialties []  Imaging w/ QHP(s) from other specialties  [x]  Treatment plan w/ QHP(s) from other specialties Plan discussed with primary team Any     Mgm't requires: []  Prescription drug(s)  [mod]  []  Kidney biopsy  [mod]  []  Central line placement  [mod] [x]  High risk medication use and/or intensive toxicity monitoring [high]  []  Renal replacement therapy [high]  []  High risk kidney biopsy  [high]  []  Escalation of care  [high]  []  High risk central line placement  [high] RRT: High  risk of complications from RRT requiring intensive monitoring Risk      ____________________________________________________    History of Present Illness: Tammy Braun is 83 y.o. 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 Nephrology has been consulted for AKI on CKD stage 3A concern for CRRT needs.     Patient originally admitted to the hospital 02/26/2024 with projectile vomiting and abdominal pain found to have small bowel obstruction with transition point at ileal conduit.  Patient was taken to the OR on 2/28 for ex lap and reduction of stoma as well as small bowel resection and lysis of adhesions.  Abdomen closure was attempted on 03/01/2024 but was later closed on 03/03/2024 due to due to small bowel dilation.  Unfortunately hospitalization was further complicated by AKI on CKD stage II/III3 A which has progressively gotten worse.  Patient was being followed by urology in the setting of ileal conduit.  Patient was found to have stable mild bilateral hydronephrosis with low suspicion for obstruction.  Catheter was placed into stoma.   Hospitalization was further complicated by encephalopathy after stopping sedation medications.  Thus nephrology was consulted with concerns for uremia.    INPATIENT MEDICATIONS:    Current Facility-Administered Medications:     acetaminophen (OFIRMEV) 10 mg/mL injection 1,000 mg, Intravenous, Q8H    albuterol 2.5 mg /3 mL (0.083 %) nebulizer solution 2.5 mg, Nebulization, Q6H PRN    [EXPIRED] amiodarone 360 mg in dextrose 5% 200 mL (1.8 mg/mL) infusion PMB, Intravenous, Continuous **FOLLOWED BY** amiodarone 360 mg in dextrose 5% 200 mL (1.8 mg/mL) infusion PMB, Intravenous, Continuous    [Provider Hold] aspirin chewable tablet 81 mg, Enteral tube: gastric, Daily    [Provider Hold] atorvastatin (LIPITOR) tablet 40 mg, Enteral tube: gastric, Daily    budesonide (PULMICORT) nebulizer solution 0.5 mg, Nebulization, BID (RT)    [Provider Hold] cyclobenzaprine (FLEXERIL) tablet 5 mg, Oral, BID PRN    dextrose (D10W) 10% bolus 125 mL, Intravenous, Q10 Min PRN    flu vaccine [id number] (72YR UP)(PF)(FLUZONE HIGH DOSE), Intramuscular, During hospitalization    [START ON 03/14/2024] fluPHENAZine decanoate (PROLIXIN) injection 50 mg, Intramuscular, Q30 Days    heparin (porcine) 1000 unit/mL injection 2,000 Units, Intravenous, Q6H PRN    heparin 25,000 Units/250 mL (100 units/mL) in 0.45% saline infusion (premade), Intravenous, Continuous    HYDROmorphone (PF) (DILAUDID) injection Syrg 0.5 mg, Intravenous, Q4H PRN **OR** HYDROmorphone (DILAUDID) injection 1 mg, Intravenous, Q4H PRN    insulin regular (HumuLIN,NovoLIN) injection 0-20 Units, Subcutaneous, Q6H SCH    ipratropium (ATROVENT) 0.02 % nebulizer solution 500 mcg, Nebulization, Q6H (RT)    [START ON 03/07/2024] meropenem (MERREM) 500 mg in sodium chloride 0.9 % (NS) 100 mL IVPB-MBP, Intravenous, Q24H    nicotine polacrilex (NICORETTE) gum 4 mg, Buccal, Q1H PRN **OR** nicotine polacrilex (NICORETTE) lozenge 4 mg, Buccal, Q1H PRN    NORepinephrine 8 mg in dextrose 5 % 250 mL (32 mcg/mL) infusion PMB, Intravenous, Continuous    ondansetron (ZOFRAN) injection 4 mg, Intravenous, Q6H PRN    pantoprazole (Protonix) injection 40 mg, Intravenous, Daily    [Provider Hold] polyethylene glycol (MIRALAX) packet 17 g, Oral, Daily    [Provider Hold] sennosides (SENOKOT) oral syrup, Oral, Nightly    Facility-Administered Medications Ordered in Other Encounters:     EPINEPHrine 100 mcg/10 mL (10 mcg/mL) injection,  Intravenous, PRN (once a day)    Propofol (DIPRIVAN) injection, Intravenous, PRN (once a day)    succinylcholine (ANECTINE) injection, Intravenous, PRN (once a day)    OUTPATIENT MEDICATIONS:  Prior to Admission medications    Medication Dose, Route, Frequency   albuterol HFA 90 mcg/actuation inhaler 2 puffs, Inhalation, Every 4 hours PRN   amlodipine (NORVASC) 10 MG tablet 10 mg, Oral, Daily (standard)   atorvastatin (LIPITOR) 40 MG tablet 40 mg, Oral, Daily (standard)   blood sugar diagnostic (GLUCOSE BLOOD) Strp Disp test strips preferred by insurance plan. Testing qday, Dx: E11.9 (Type 2 DM- controlled)   blood-glucose meter kit Disp. blood glucose meter kit preferred by patient's insurance. Check blood sugars as directed by provider. Dx: Diabetes, E11.9   candesartan (ATACAND) 16 MG tablet 1 po bid   cyclobenzaprine (FLEXERIL) 5 MG tablet 5 mg, Oral, 2 times a day PRN   dilTIAZem (CARDIZEM CD) 120 MG 24 hr capsule 120 mg, Oral, Daily (standard)   fluPHENAZine decanoate (PROLIXIN) 25 mg/mL injection 50 mg, Intramuscular, Every 30 days   furosemide (LASIX) 20 MG tablet TAKE 2 TABLETS BY MOUTH DAILY FOR 5 DAYS, THEN 1 TABLET DAILY   gabapentin (NEURONTIN) 100 MG capsule 100 mg, Oral, 3 times a day (standard)   inhalational spacing device (E-Z SPACER) Spcr 1 each, Miscellaneous, Every 4 hours PRN   lancets Misc Disp. lancets #30 or amount allowed, Testing once Qday. 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    Chronic kidney disease     Depression, psychotic (CMS-HCC)     Diabetes mellitus (CMS-HCC)     in past    Emphysema of lung (CMS-HCC)     Financial difficulties     Frail elderly     Hearing impairment     Hernia     History of transfusion     Hyperlipidemia     Hypertension     Impaired mobility     Osteoporosis     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/08/2015    Visual impairment      Past Surgical History:   Procedure Laterality Date    ABDOMINAL SURGERY      BLADDER SURGERY      BREAST CYST EXCISION      CHEMOTHERAPY  2012    bladder    GALLBLADDER SURGERY      stone removal    ILEOSTOMY  2012    PR COLONOSCOPY FLX DX W/COLLJ SPEC WHEN PFRMD N/A 02/09/2015    Procedure: COLONOSCOPY, FLEXIBLE, PROXIMAL TO SPLENIC FLEXURE; DIAGNOSTIC, W/WO COLLECTION SPECIMEN BY BRUSH OR WASH;  Surgeon: Dewaine Conger, MD;  Location: [facility] [place];  Service: Gastroenterology    PR EXPLORATORY OF ABDOMEN N/A 02/28/2024    Procedure: EXPLORATORY LAPAROTOMY, EXPLORATORY CELIOTOMY WITH OR WITHOUT BIOPSY(S);  Surgeon: Renda Rolls, MD;  Location: [facility];  Service: General Surgery    PR RECONSTR TOTAL SHOULDER IMPLANT Left 04/08/2018    Procedure: ARTHROPLASTY, GLENOHUMERAL JOINT; TOTAL SHOULDER(GLENOID & PROXIMAL HUMERAL REPLACEMENT(EG, TOTAL SHOULDER);  Surgeon: Tomasa Rand, MD;  [facility];  Service: Ortho Sports Medicine    PR REOPEN RECENT ABD EXPLORATORY Midline 03/01/2024    Procedure: REOPENING OF RECENT LAPAROTOMY;  Surgeon: [name]., MD;  Location: OR [facility];  Service: Trauma    PR REOPEN RECENT ABD EXPLORATORY N/A 03/03/2024    Procedure: REOPENING OF RECENT LAPAROTOMY;  Surgeon: Joanie Coddington, MD;  Location: OR [facility];  Service: Trauma    PR SIGMOIDOSCOPY,BIOPSY N/A 03/11/2015    Procedure: SIGMOIDOSCOPY, FLEXIBLE; WITH BIOPSY, SINGLE OR MULTIPLE;  Surgeon: Wilburt Finlay, MD;  [facility];  Service: Gastroenterology    PR XCAPSL CTRC RMVL INSJ IO LENS PROSTH W/O ECP Left 06/05/2022    Procedure: EXTRACAPSULAR CATARACT REMOVAL W/INSERTION OF INTRAOCULAR LENS PROSTHESIS, MANUAL OR MECHANICAL TECHNIQUE WITHOUT ENDOSCOPIC CYCLOPHOTOCOAGULATION;  Surgeon: Garner Gavel, MD;  Location: [facility] OR [facility];  Service: Ophthalmology    PR XCAPSL CTRC RMVL INSJ IO LENS PROSTH W/O ECP Right 06/19/2022    Procedure: EXTRACAPSULAR CATARACT REMOVAL W/INSERTION OF INTRAOCULAR LENS PROSTHESIS, MANUAL OR MECHANICAL TECHNIQUE WITHOUT ENDOSCOPIC CYCLOPHOTOCOAGULATION;  Surgeon: Garner Gavel, MD;  Location: [facility];  Service: Ophthalmology     SOCIAL HISTORY  Social History     Social History Narrative    Not on file      reports that she has been smoking cigarettes. She started smoking about 65 years ago. She has a 65.9 pack-year smoking history. She has never used smokeless tobacco. She reports that she does not drink alcohol and does not use drugs.   FAMILY HISTORY  Family History   Problem Relation Age of Onset    Breast cancer Daughter 59    Diabetes Mother     Glaucoma Father     Colon cancer Neg Hx     Ovarian cancer Neg Hx     Endometrial cancer Neg Hx     Anesthesia problems Neg Hx     Bleeding Disorder Neg Hx         Physical Exam:   Vitals:    03/06/24 0818 03/06/24 0900 03/06/24 1000 03/06/24 1100   BP: 94/55 104/68 113/71 96/65   Pulse: 107 124 126 118   Resp: 20 20 25 20    Temp: 37.1 ??C (98.8 ??F)  37.2 ??C (98.9 ??F)    TempSrc: Axillary  Axillary    SpO2: 99% 99% 97% 99%   Weight:       Height:         I/O this shift:  In: 340.6 [I.V.:130.6; NG/GT:60; IV Piggyback:150]  Out: 187 [Urine:100; Emesis/NG output:2; Drains:85]    Intake/Output Summary (Last 24 hours) at 03/06/2024 1136  Last data filed at 03/06/2024 1100  Gross per 24 hour   Intake 1943.21 ml   Output 1117 ml   Net 826.21 ml     Constitutional: frail, ill appearing no acute distress  Heart: RRR, no m/r/g  Lungs: intubated with rhonchorous sounds L>R  Abd: soft, non-tender, non-distended  Ext: no edema

## 2024-03-06 NOTE — Unmapped (Signed)
 Patient does not follow commands. No movement in extremities. Patient pupils equal round and reactive to light. Cough, gag, corneal reflex intact. Patient is Afib rate controlled on the monitor. BP is supported with Fluid bolus, Levo, and albumin. Patient remains intubated. Patient has NG tube and is receiving TF @goal . Patient has urostomy. No bm overnight. R Internal Jugular Central Line in place, PIVs, and R radial arterial line. Family at  beside and updated via phone     Problem: Fall Injury Risk  Goal: Absence of Fall and Fall-Related Injury  Outcome: Ongoing - Unchanged  Intervention: Promote Injury-Free Environment  Recent Flowsheet Documentation  Taken 03/06/2024 0000 by Lisabeth Pick, RN  Safety Interventions:   aspiration precautions   bed alarm   bleeding precautions   enteral feeding safety   family at bedside  Taken 03/05/2024 2200 by Lisabeth Pick, RN  Safety Interventions:   aspiration precautions   bed alarm   bleeding precautions   enteral feeding safety   family at bedside  Taken 03/05/2024 2000 by Lisabeth Pick, RN  Safety Interventions:   aspiration precautions   bed alarm   bleeding precautions   enteral feeding safety   family at bedside     Problem: Wound  Goal: Optimal Coping  Outcome: Ongoing - Unchanged  Goal: Optimal Functional Ability  Outcome: Ongoing - Unchanged  Intervention: Optimize Functional Ability  Recent Flowsheet Documentation  Taken 03/06/2024 0000 by Lisabeth Pick, RN  Activity Management: bedrest  Taken 03/05/2024 2200 by Lisabeth Pick, RN  Activity Management: bedrest  Taken 03/05/2024 2000 by Lisabeth Pick, RN  Activity Management: bedrest     Problem: Skin Injury Risk Increased  Goal: Skin Health and Integrity  Outcome: Ongoing - Unchanged  Intervention: Optimize Skin Protection  Recent Flowsheet Documentation  Taken 03/06/2024 0000 by Lisabeth Pick, RN  Activity Management: bedrest  Pressure Reduction Techniques: weight shift assistance provided  Head of Bed (HOB) Positioning: HOB at 30-45 degrees  Pressure Reduction Devices: positioning supports utilized  Skin Protection: incontinence pads utilized  Taken 03/05/2024 2200 by Lisabeth Pick, RN  Activity Management: bedrest  Pressure Reduction Techniques: weight shift assistance provided  Head of Bed (HOB) Positioning: HOB at 30-45 degrees  Pressure Reduction Devices: positioning supports utilized  Skin Protection: incontinence pads utilized  Taken 03/05/2024 2000 by Lisabeth Pick, RN  Activity Management: bedrest  Pressure Reduction Techniques: weight shift assistance provided  Head of Bed (HOB) Positioning: HOB at 30-45 degrees  Pressure Reduction Devices: positioning supports utilized  Skin Protection: incontinence pads utilized     Problem: Self-Care Deficit  Goal: Improved Ability to Complete Activities of Daily Living  Outcome: Ongoing - Unchanged

## 2024-03-06 NOTE — Unmapped (Addendum)
 CVAD Liaison - Change over Wire Procedure Note    The CVAD Liaison was contacted for the change over wire of Central Venous Access Device (CVAD).  A chart review performed.   Indication: Dialysis    Prior to the start of the procedure, a time out was performed and the identity of the patient was confirmed via name, medical record number and date of birth.  The sterile field was prepared with necessary supplies and equipment verified.  Insertion site was prepped with chlorhexidine solution and allowed to dry.  Maximum sterile techniques was utilized.  CVAD was changed over wire by Dr. Rigoberto Noel MD Catheter was aspirated and flushed.  After line was placed and secured by provider, the insertion site cleansed and sterile dressing applied per manufacturer guidelines by CVAD Liaison.       CVAD Liaison was present during entire procedure.  Report of the procedure given to the Primary Nurse.      Thank you for this consult,  Ivery Quale, RN RN, CVAD Liaison     Consult Time 45 minutes

## 2024-03-06 NOTE — Unmapped (Signed)
 Patient currently on PRVC (see chart for settings). ETT rotated through out shift. Re taped x 2. Suctioned large amount of tan/yellow thin secretions. Transport to [facility] ED) and back to [facility]. All emergency equi[ment at bedside.  Problem: Mechanical Ventilation Invasive  Goal: Effective Communication  Outcome: Ongoing - Unchanged  Goal: Optimal Device Function  Outcome: Ongoing - Unchanged  Intervention: Optimize Device Care and Function  Recent Flowsheet Documentation  Taken 03/06/2024 0818 by Serina Cowper, RRT  Oral Care:   lip/mouth moisturizer applied   oral rinse provided   mouth swabbed   suction provided   teeth brushed   tongue brushed  Goal: Mechanical Ventilation Liberation  Outcome: Ongoing - Unchanged  Goal: Optimal Nutrition Delivery  Outcome: Ongoing - Unchanged  Goal: Absence of Device-Related Skin and Tissue Injury  Outcome: Ongoing - Unchanged  Goal: Absence of Ventilator-Induced Lung Injury  Outcome: Ongoing - Unchanged  Intervention: Prevent Ventilator-Associated Pneumonia  Recent Flowsheet Documentation  Taken 03/06/2024 0818 by Serina Cowper, RRT  Head of Bed (HOB) Positioning: HOB at 30 degrees  Oral Care:   lip/mouth moisturizer applied   oral rinse provided   mouth swabbed   suction provided   teeth brushed   tongue brushed     Problem: Artificial Airway  Goal: Effective Communication  Outcome: Ongoing - Unchanged  Goal: Optimal Device Function  Outcome: Ongoing - Unchanged  Intervention: Optimize Device Care and Function  Recent Flowsheet Documentation  Taken 03/06/2024 0818 by Serina Cowper, RRT  Oral Care:   lip/mouth moisturizer applied   oral rinse provided   mouth swabbed   suction provided   teeth brushed   tongue brushed  Goal: Absence of Device-Related Skin or Tissue Injury  Outcome: Ongoing - Unchanged

## 2024-03-07 LAB — BLOOD GAS CRITICAL CARE PANEL, ARTERIAL
BASE EXCESS ARTERIAL: -8.1 — ABNORMAL LOW (ref -2.0–2.0)
CALCIUM IONIZED ARTERIAL (MG/DL): 4.96 mg/dL (ref 4.40–5.40)
GLUCOSE WHOLE BLOOD: 164 mg/dL (ref 70–179)
HCO3 ARTERIAL: 17 mmol/L — ABNORMAL LOW (ref 22–27)
HEMOGLOBIN BLOOD GAS: 7.1 g/dL — ABNORMAL LOW (ref 12.00–16.00)
LACTATE BLOOD ARTERIAL: 0.8 mmol/L (ref <1.3)
O2 SATURATION ARTERIAL: 98.8 % (ref 94.0–100.0)
PCO2 ARTERIAL: 31 mmHg — ABNORMAL LOW (ref 35.0–45.0)
PH ARTERIAL: 7.34 — ABNORMAL LOW (ref 7.35–7.45)
PO2 ARTERIAL: 136 mmHg — ABNORMAL HIGH (ref 80.0–110.0)
POTASSIUM WHOLE BLOOD: 3.4 mmol/L (ref 3.4–4.6)
SODIUM WHOLE BLOOD: 140 mmol/L (ref 135–145)

## 2024-03-07 LAB — CBC
HEMATOCRIT: 25.9 % — ABNORMAL LOW (ref 34.0–44.0)
HEMATOCRIT: 25.5 % — ABNORMAL LOW (ref 34.0–44.0)
HEMOGLOBIN: 8.5 g/dL — ABNORMAL LOW (ref 11.3–14.9)
HEMOGLOBIN: 8.2 g/dL — ABNORMAL LOW (ref 11.3–14.9)
MEAN CORPUSCULAR HEMOGLOBIN CONC: 32.9 g/dL (ref 32.0–36.0)
MEAN CORPUSCULAR HEMOGLOBIN CONC: 32.2 g/dL (ref 32.0–36.0)
MEAN CORPUSCULAR HEMOGLOBIN: 27.7 pg (ref 25.9–32.4)
MEAN CORPUSCULAR HEMOGLOBIN: 27.3 pg (ref 25.9–32.4)
MEAN CORPUSCULAR VOLUME: 84.2 fL (ref 77.6–95.7)
MEAN CORPUSCULAR VOLUME: 84.7 fL (ref 77.6–95.7)
MEAN PLATELET VOLUME: 9.2 fL (ref 6.8–10.7)
MEAN PLATELET VOLUME: 9.4 fL (ref 6.8–10.7)
PLATELET COUNT: 244 10*9/L (ref 150–450)
PLATELET COUNT: 242 10*9/L (ref 150–450)
RED BLOOD CELL COUNT: 3.07 10*12/L — ABNORMAL LOW (ref 3.95–5.13)
RED BLOOD CELL COUNT: 3.01 10*12/L — ABNORMAL LOW (ref 3.95–5.13)
RED CELL DISTRIBUTION WIDTH: 18.2 % — ABNORMAL HIGH (ref 12.2–15.2)
RED CELL DISTRIBUTION WIDTH: 18.2 % — ABNORMAL HIGH (ref 12.2–15.2)
WBC ADJUSTED: 19.3 10*9/L — ABNORMAL HIGH (ref 3.6–11.2)
WBC ADJUSTED: 21.9 10*9/L — ABNORMAL HIGH (ref 3.6–11.2)

## 2024-03-07 LAB — BASIC METABOLIC PANEL
ANION GAP: 17 mmol/L — ABNORMAL HIGH (ref 5–14)
ANION GAP: 18 mmol/L — ABNORMAL HIGH (ref 5–14)
ANION GAP: 19 mmol/L — ABNORMAL HIGH (ref 5–14)
ANION GAP: 18 mmol/L — ABNORMAL HIGH (ref 5–14)
BLOOD UREA NITROGEN: 101 mg/dL — ABNORMAL HIGH (ref 9–23)
BLOOD UREA NITROGEN: 85 mg/dL — ABNORMAL HIGH (ref 9–23)
BLOOD UREA NITROGEN: 83 mg/dL — ABNORMAL HIGH (ref 9–23)
BLOOD UREA NITROGEN: 66 mg/dL — ABNORMAL HIGH (ref 9–23)
BUN / CREAT RATIO: 26
BUN / CREAT RATIO: 26
BUN / CREAT RATIO: 27
BUN / CREAT RATIO: 26
CALCIUM: 8.3 mg/dL — ABNORMAL LOW (ref 8.7–10.4)
CALCIUM: 8.3 mg/dL — ABNORMAL LOW (ref 8.7–10.4)
CALCIUM: 8.5 mg/dL — ABNORMAL LOW (ref 8.7–10.4)
CALCIUM: 8.6 mg/dL — ABNORMAL LOW (ref 8.7–10.4)
CHLORIDE: 110 mmol/L — ABNORMAL HIGH (ref 98–107)
CHLORIDE: 106 mmol/L (ref 98–107)
CHLORIDE: 105 mmol/L (ref 98–107)
CHLORIDE: 105 mmol/L (ref 98–107)
CO2: 17 mmol/L — ABNORMAL LOW (ref 20.0–31.0)
CO2: 17 mmol/L — ABNORMAL LOW (ref 20.0–31.0)
CO2: 18 mmol/L — ABNORMAL LOW (ref 20.0–31.0)
CO2: 20 mmol/L (ref 20.0–31.0)
CREATININE: 3.87 mg/dL — ABNORMAL HIGH (ref 0.55–1.02)
CREATININE: 3.27 mg/dL — ABNORMAL HIGH (ref 0.55–1.02)
CREATININE: 3.05 mg/dL — ABNORMAL HIGH (ref 0.55–1.02)
CREATININE: 2.5 mg/dL — ABNORMAL HIGH (ref 0.55–1.02)
EGFR CKD-EPI (2021) FEMALE: 11 mL/min/{1.73_m2} — ABNORMAL LOW (ref >=60)
EGFR CKD-EPI (2021) FEMALE: 14 mL/min/{1.73_m2} — ABNORMAL LOW (ref >=60)
EGFR CKD-EPI (2021) FEMALE: 15 mL/min/{1.73_m2} — ABNORMAL LOW (ref >=60)
EGFR CKD-EPI (2021) FEMALE: 19 mL/min/{1.73_m2} — ABNORMAL LOW (ref >=60)
GLUCOSE RANDOM: 148 mg/dL — ABNORMAL HIGH (ref 70–99)
GLUCOSE RANDOM: 162 mg/dL — ABNORMAL HIGH (ref 70–99)
GLUCOSE RANDOM: 163 mg/dL (ref 70–179)
GLUCOSE RANDOM: 127 mg/dL — ABNORMAL HIGH (ref 70–99)
POTASSIUM: 3.4 mmol/L (ref 3.4–4.8)
POTASSIUM: 3.4 mmol/L (ref 3.4–4.8)
POTASSIUM: 3.3 mmol/L — ABNORMAL LOW (ref 3.4–4.8)
POTASSIUM: 3.9 mmol/L (ref 3.4–4.8)
SODIUM: 144 mmol/L (ref 135–145)
SODIUM: 141 mmol/L (ref 135–145)
SODIUM: 142 mmol/L (ref 135–145)
SODIUM: 143 mmol/L (ref 135–145)

## 2024-03-07 LAB — PHOSPHORUS
PHOSPHORUS: 5 mg/dL (ref 2.4–5.1)
PHOSPHORUS: 4.6 mg/dL (ref 2.4–5.1)
PHOSPHORUS: 4.4 mg/dL (ref 2.4–5.1)
PHOSPHORUS: 4.2 mg/dL (ref 2.4–5.1)

## 2024-03-07 LAB — APTT
APTT: 40.2 s — ABNORMAL HIGH (ref 24.8–38.4)
APTT: 62.6 s — ABNORMAL HIGH (ref 24.8–38.4)
HEPARIN CORRELATION: 0.2
HEPARIN CORRELATION: 0.4

## 2024-03-07 LAB — MAGNESIUM
MAGNESIUM: 2.5 mg/dL (ref 1.6–2.6)
MAGNESIUM: 2.3 mg/dL (ref 1.6–2.6)
MAGNESIUM: 2.3 mg/dL (ref 1.6–2.6)
MAGNESIUM: 2.1 mg/dL (ref 1.6–2.6)

## 2024-03-07 LAB — IONIZED CALCIUM VENOUS
CALCIUM IONIZED VENOUS (MG/DL): 4.57 mg/dL (ref 4.40–5.40)
CALCIUM IONIZED VENOUS (MG/DL): 4.96 mg/dL (ref 4.40–5.40)
CALCIUM IONIZED VENOUS (MG/DL): 4.86 mg/dL (ref 4.40–5.40)

## 2024-03-07 MED ADMIN — NxStage/multiBic RFP 401 (+/- BB) 5000 mL - contains 4 mEq/L of potassium dialysis solution 5,000 mL: 5000 mL | INTRAVENOUS_CENTRAL | @ 16:00:00

## 2024-03-07 MED ADMIN — meropenem (MERREM) 500 mg in sodium chloride 0.9 % (NS) 100 mL IVPB-MBP: 500 mg | INTRAVENOUS | @ 15:00:00 | Stop: 2024-03-12

## 2024-03-07 MED ADMIN — meropenem (MERREM) 500 mg in sodium chloride 0.9 % (NS) 100 mL IVPB-MBP: 500 mg | INTRAVENOUS | @ 22:00:00 | Stop: 2024-03-12

## 2024-03-07 MED ADMIN — budesonide (PULMICORT) nebulizer solution 0.5 mg: .5 mg | RESPIRATORY_TRACT | @ 02:00:00

## 2024-03-07 MED ADMIN — NxStage/multiBic RFP 401 (+/- BB) 5000 mL - contains 4 mEq/L of potassium dialysis solution 5,000 mL: 5000 mL | INTRAVENOUS_CENTRAL | @ 08:00:00

## 2024-03-07 MED ADMIN — heparin 25,000 Units/250 mL (100 units/mL) in 0.45% saline infusion (premade): 0-24 [IU]/kg/h | INTRAVENOUS | @ 07:00:00

## 2024-03-07 MED ADMIN — insulin regular (HumuLIN,NovoLIN) injection 0-20 Units: 0-20 [IU] | SUBCUTANEOUS | @ 05:00:00

## 2024-03-07 MED ADMIN — amiodarone 360 mg in dextrose 5% 200 mL (1.8 mg/mL) infusion PMB: .5 mg/min | INTRAVENOUS | @ 22:00:00 | Stop: 2024-03-09

## 2024-03-07 MED ADMIN — heparin (porcine) 1000 unit/mL injection 2,000 Units: 2000 [IU] | INTRAVENOUS | @ 10:00:00

## 2024-03-07 MED ADMIN — sodium chloride (NS) 0.9 % flush 10 mL: 10 mL | INTRAVENOUS | @ 19:00:00

## 2024-03-07 MED ADMIN — budesonide (PULMICORT) nebulizer solution 0.5 mg: .5 mg | RESPIRATORY_TRACT | @ 13:00:00

## 2024-03-07 MED ADMIN — heparin (porcine) 1000 unit/mL injection 1,200 Units: 1200 [IU] | @ 05:00:00 | Stop: 2024-03-06

## 2024-03-07 MED ADMIN — insulin NPH (HumuLIN,NovoLIN) injection 2 Units: 2 [IU] | SUBCUTANEOUS | @ 15:00:00

## 2024-03-07 MED ADMIN — NORepinephrine 8 mg in dextrose 5 % 250 mL (32 mcg/mL) infusion PMB: 0-10 ug/min | INTRAVENOUS | @ 07:00:00

## 2024-03-07 MED ADMIN — heparin (porcine) 1000 unit/mL injection 1,200 Units: 1200 [IU] | INTRAVENOUS | @ 15:00:00

## 2024-03-07 MED ADMIN — HYDROmorphone (PF) (DILAUDID) injection Syrg 0.5 mg: .5 mg | INTRAVENOUS | @ 23:00:00 | Stop: 2024-03-14

## 2024-03-07 MED ADMIN — ipratropium (ATROVENT) 0.02 % nebulizer solution 500 mcg: 500 ug | RESPIRATORY_TRACT | @ 02:00:00

## 2024-03-07 MED ADMIN — amiodarone 360 mg in dextrose 5% 200 mL (1.8 mg/mL) infusion PMB: .5 mg/min | INTRAVENOUS | @ 09:00:00 | Stop: 2024-03-09

## 2024-03-07 MED ADMIN — potassium chloride 20 mEq in 100 mL IVPB Premix: 20 meq | INTRAVENOUS | @ 19:00:00 | Stop: 2024-03-07

## 2024-03-07 MED ADMIN — pantoprazole (Protonix) injection 40 mg: 40 mg | INTRAVENOUS | @ 13:00:00

## 2024-03-07 MED ADMIN — insulin regular (HumuLIN,NovoLIN) injection 0-20 Units: 0-20 [IU] | SUBCUTANEOUS | @ 17:00:00

## 2024-03-07 MED ADMIN — acetaminophen (OFIRMEV) 10 mg/mL injection 1,000 mg: 1000 mg | INTRAVENOUS | @ 07:00:00 | Stop: 2024-03-07

## 2024-03-07 MED ADMIN — acetaminophen (OFIRMEV) 10 mg/mL injection 1,000 mg: 1000 mg | INTRAVENOUS | @ 14:00:00 | Stop: 2024-03-08

## 2024-03-07 MED ADMIN — fentaNYL (PF) (SUBLIMAZE) injection 100 mcg: 100 ug | INTRAVENOUS | @ 21:00:00 | Stop: 2024-03-07

## 2024-03-07 MED ADMIN — acetaminophen (OFIRMEV) 10 mg/mL injection 1,000 mg: 1000 mg | INTRAVENOUS | @ 23:00:00 | Stop: 2024-03-08

## 2024-03-07 NOTE — Unmapped (Signed)
 CVAD Liaison - Insertion Note      The CVAD Liaison was contacted for the insertion of Central Venous Access Device (CVAD).  A chart review performed.   Indication: TPN    Prior to the start of the procedure, a time out was performed and the identity of the patient was confirmed via name, medical record number and date of birth.  The sterile field was prepared with necessary supplies and equipment verified.  Insertion site was prepped with chlorhexidine and allowed to dry.  Maximum sterile techniques was utilized.    CVAD was inserted by Dr. Sabino Niemann.  Catheter was aspirated and flushed.  After line was placed and secured by provider, the insertion site cleansed and sterile dressing applied per manufacturer guidelines by CVAD Liaison.     The Central Line Checklist was referenced.  CVAD Liaison was present during entire procedure.  Report of the procedure given to the Primary Nurse.      Thank you for this consult,  Shelva Majestic Aaro Meyers, RN, CVAD Liaison     Consult Time 60 minutes

## 2024-03-07 NOTE — Unmapped (Signed)
 Continuous Renal Replacement  Dialysis Nurse Therapy Procedure Note    Treatment Type:  [facility] Number Of Days On Therapy:   Procedure Date:  03/07/2024 3:35 AM     TREATMENT STATUS:  Started   None            Active Dialysis Orders (168h ago, onward)       Start     Ordered    03/06/24 1603  CRRT Orders - NxStage (Adult)  Continuous        Comments: MAP <60 UF 10 ml/hr  MAP 60-65 UF 50-100 ml/hr  MAP>65 UF 100-150 ml/hr   Question Answer Comment   CRRT System: NxStage    Modality: CVVH    Access: Other    BFR (mL/min): 200-350    Dialysate Flow Rate (mL/kg/hr): 25 mL/kg/hr 1.7 L/hr   Fluid Removal Initial Rate (mL/hr): 10        03/06/24 1604                  SYSTEM CHECK:  Machine Name: [id number]  Dialyzer: [id number]   Self Test Completed: Yes.        Alarms Connected To The Wall And Active:  No.    VITAL SIGNS:  Temp:  [36.2 ??C (97.2 ??F)-37.2 ??C (99 ??F)] 36.2 ??C (97.2 ??F)  Heart Rate:  [103-126] 115  SpO2 Pulse:  [96-129] 117  Resp:  [16-29] 21  SpO2:  [94 %-100 %] 97 %  BP: (85-141)/(55-89) 87/64  MAP (mmHg):  [64-105] 69  A BP-1: (74-136)/(45-87) 98/58  MAP:  [55 mmHg-103 mmHg] 71 mmHg    ACCESS SITE:        Hemodialysis Catheter With Distal Infusion Port 03/06/24 Right Internal jugular 1.2 mL 1.2 mL (Active)   Site Assessment Clean;Dry;Intact 03/07/24 0400   Proximal Lumen Status / Patency Blood Return - Brisk 03/07/24 0400   Proximal Lumen Intervention Accessed 03/07/24 0400   Medial Lumen Status / Patency Blood Return - Brisk 03/07/24 0400   Medial Lumen Intervention Accessed 03/07/24 0400   Lumen 3, Distal Status / Patency Infusing 03/07/24 0400   Distal Lumen Intervention Accessed 03/07/24 0400   Exposed Catheter Length (cm) 0 cm 03/07/24 0030   IV Tubing and Needleless Injector Cap Change Due 03/11/24 03/07/24 0400   Dressing Type CHG gel;Occlusive;Transparent 03/07/24 0400   Dressing Status      Clean;Dry;Intact/not removed 03/07/24 0400   Dressing Intervention No intervention needed 03/07/24 0400 Contraindicated due to: Dressing Intact surrounding insertion site 03/07/24 0400   Dressing Change Due 03/13/24 03/07/24 0400   Line Necessity Reviewed? Y 03/07/24 0400   Line Necessity Indications Yes - Hemodialysis 03/07/24 0400   Line Necessity Reviewed With nephrology 03/07/24 0400          CATHETER FILL VOLUMES:     Arterial: NA  Venous: NA     Lab Results   Component Value Date    NA 144 03/07/2024    K 3.4 03/07/2024    CL 110 (H) 03/07/2024    CO2 17.0 (L) 03/07/2024    BUN 101 (H) 03/07/2024     Lab Results   Component Value Date    GLUCOSE 152 10/05/2011    CALCIUM 8.3 (L) 03/07/2024    CAION 4.57 03/07/2024    PHOS 5.0 03/07/2024    MG 2.5 03/07/2024        SETTINGS:  Blood Pump Rate: 300 mL/min  Replacement Fluid Rate:     Pre-Blood Pump Fluid  Rate:    Hourly Fluid Removal Rate: 100 mL/hr   Dialysate Fluid Rate    Therapy Fluid Temperature:       ANTICOAGULANT:  None    ADDITIONAL COMMENTS:  None    HEMODIALYSIS ON-CALL NURSE PAGER NUMBER:  Monday thru Saturday 0700 - 1730: Call the Dialysis Unit ext. [phone]   After 1730 and all day Sunday: Call the Dialysis RN Pager Number [phone]     PROCEDURE REVIEW, VERIFICATION, HANDOFF:  CRRT settings verified, procedure reviewed, and instructions given to primary RN.     Primary CRRT RN Verifying: Lelon Huh, RN Dialysis RN Verifying: L.Jadeyn Hargett,RN/Dialysis

## 2024-03-07 NOTE — Unmapped (Signed)
 Continuous Renal Replacement  Dialysis Nurse Therapy Procedure Note    Treatment Type:  [facility] Number Of Days On Therapy:  0 Procedure Date:  03/07/2024 12:22 PM     TREATMENT STATUS:  Continuous   None            Active Dialysis Orders (168h ago, onward)       Start     Ordered    03/06/24 1603  CRRT Orders - NxStage (Adult)  Continuous        Comments: MAP <60 UF 10 ml/hr  MAP 60-65 UF 50-100 ml/hr  MAP>65 UF 100-150 ml/hr   Question Answer Comment   CRRT System: NxStage    Modality: CVVH    Access: Other    BFR (mL/min): 200-350    Dialysate Flow Rate (mL/kg/hr): 25 mL/kg/hr 1.7 L/hr   Fluid Removal Initial Rate (mL/hr): 10        03/06/24 1604                  SYSTEM CHECK:  Machine Name: [id number]  Dialyzer: [id number]   Self Test Completed: yes        Alarms Connected To The Wall And Active:   an    VITAL SIGNS:  Temp:  [36.2 ??C (97.2 ??F)-36.8 ??C (98.2 ??F)] 36.3 ??C (97.3 ??F)  Heart Rate:  [103-124] 113  SpO2 Pulse:  [92-129] 109  Resp:  [16-29] 19  SpO2:  [94 %-100 %] 100 %  BP: (85-141)/(57-89) 121/78  MAP (mmHg):  [64-105] 91  A BP-1: (74-138)/(45-81) 123/73  MAP:  [55 mmHg-103 mmHg] 85 mmHg    ACCESS SITE:        Hemodialysis Catheter With Distal Infusion Port 03/06/24 Right Internal jugular 1.2 mL 1.2 mL (Active)   Site Assessment Clean;Dry;Intact 03/07/24 1115   Proximal Lumen Status / Patency Blood Return - Brisk 03/07/24 1115   Proximal Lumen Intervention Accessed 03/07/24 1115   Medial Lumen Status / Patency Blood Return - Brisk 03/07/24 1115   Medial Lumen Intervention Accessed 03/07/24 1115   Lumen 3, Distal Status / Patency Infusing 03/07/24 0800   Distal Lumen Intervention Accessed 03/07/24 0800   Exposed Catheter Length (cm) 0 cm 03/07/24 0030   IV Tubing and Needleless Injector Cap Change Due 03/13/24 03/07/24 0800   Dressing Type CHG gel;Occlusive;Transparent 03/07/24 1115   Dressing Status      Clean;Dry;Intact/not removed 03/07/24 1115   Dressing Intervention No intervention needed 03/07/24 1115 Contraindicated due to: Dressing Intact surrounding insertion site 03/07/24 1115   Dressing Change Due 03/13/24 03/07/24 1115   Line Necessity Reviewed? Y 03/07/24 1115   Line Necessity Indications Yes - Hemodialysis 03/07/24 1115   Line Necessity Reviewed With Neph 03/07/24 0800          CATHETER FILL VOLUMES:     Arterial:  mL  Venous:  mL     Lab Results   Component Value Date    NA 140 03/07/2024    NA 141 03/07/2024    K 3.4 03/07/2024    K 3.4 03/07/2024    CL 106 03/07/2024    CO2 17.0 (L) 03/07/2024    BUN 85 (H) 03/07/2024     Lab Results   Component Value Date    GLUCOSE 152 10/05/2011    CALCIUM 8.3 (L) 03/07/2024    CAION 4.96 03/07/2024    PHOS 4.6 03/07/2024    MG 2.3 03/07/2024        SETTINGS:  Blood Pump  Rate: 300 mL/min  Replacement Fluid Rate:     Pre-Blood Pump Fluid Rate:    Hourly Fluid Removal Rate: (S) 50 mL/hr   Dialysate Fluid Rate    Therapy Fluid Temperature:       ANTICOAGULANT:  None    ADDITIONAL COMMENTS:  None    HEMODIALYSIS ON-CALL NURSE PAGER NUMBER:  Monday thru Saturday 0700 - 1730: Call the Dialysis Unit ext. [phone]   After 1730 and all day Sunday: Call the Dialysis RN Pager Number [phone]     PROCEDURE REVIEW, VERIFICATION, HANDOFF:  CRRT settings verified, procedure reviewed, and instructions given to primary RN.     Primary CRRT RN Verifying: Murriel Hopper, RN Dialysis RN Verifying: a Jarell Mcewen rn

## 2024-03-07 NOTE — Unmapped (Signed)
 [facility] PROGRESS NOTE     Date of Service: 03/07/2024    Hospital Day: LOS: 9 days        Surgery Date: TBD   Surgical Attending: Merri Ray, MD    Critical Care Attending: Thalia Party, MD    Interval History:   Replace RIJ with trialysis, unsuccessfully attempted placing LIJ. CRRT was started overnight. On and off low dose pressors. Slowly improving mental status.  NPH 2u BID started overnight for persistent hyperglycemia, despite being NPO with TF held.    History of Present Illness:   Tammy Braun is a 83 y.o. female with PMH bladder cancer with cystectomy and ileal conduit, COPD, recent dx of afib, moderate-severe TR, CAD (high calcification score), T2DM presented to [facility] with projectile vomiting and abdominal pain, found to have SBO with transition point at level of ileostomy on CT A/P. On initial presentation, labs remarkable for leukocytosis thought to be iso hemoconcentration, AKI on CKD, mildly elevated venous lactate improved with resuscitation. Admitted to [facility] for worsening hypoxia on HFNC requiring intubation in setting of hiatal hernia. OR on 02/28/24 for ex-lap, reduciton of parastomal hernia, small bowel resection with primary anastomosis, lysis of adhesions, abdomen left open with ABThera in place.     Hospital Course:  - 02/26/24: Admitted to [facility], floor status, consult to urology for possible ileal conduit revision.   - 02/27/24: Patient level of care escalated to step-down iso increased oxygen needs while in ED.  - 02/28/24: Upgraded to ICU. Intubated at bed-side. OR for ex-lap, reduction of parastomal hernia, small bowel resection with primary anastomosis, lysis of adhesions, abdomen left open with ABThera in place.   - 03/01/24: OR for attempt at abdominal closure. Left open due to desaturations during closure attempt. ABThera in place.  - 03/03/24: RTOR for abdominal closure.  -03/06/24: Vas cath placed, CRRT started      ASSESSMENT & PLAN:     Neurologic:  - Pain: HM IV PRN 0.5-1mg , tylenol    > start additional MMPC agents when neurostatus improves  - Sedation: none    #AMS, somnolence  - Differentials include stroke (ischemic vs hemorrhagic - low suspicion), metabolic encephalopathy 2/2 drug induced, vs uremia, vs infection   - Off sedation since 3/4   - 3/7 Head CT without acute changes  - CRRT, although given clearing of mental status with continued elevated BUN - low suspicion of uremia induced encephalopathy   - Hold all sedating drugs     Cardiovascular:  #HTN, HLD:   - Chronic.    - MAP goal > 65    > requiring NE, low dose, wean as able  - Hold home ASA and Lipitor while NPO    #Afib/flutter w RVR:   - Prior hx of paroxysmal afib. CHADSvasc = 6, not anticoagulated PTA per chart review.   - Continue amio gtt, heparin gtt started iso PE   - Holding home diltiazem, question efficacy given likely poor absorption  - Amio load will complete Monday 3/10 AM - after load, 150mg  IV amio daily   - Echo 3/1: LVEF > 55%. Severe TR.  - Pro-BNP: 6,988 on 3/3     #Pulmonary embolism  - 3/5 CTA chest: Filling defect in L anterior segmental artery  - heparin gtt     #Acute thrombus right basilic   - Identified 3/3 on BUE duplex.  - Supportive care, superficial, AC not required     Respiratory:  #Acute on chronic hypoxemia  #Hx  of COPD  - 2/28: Intubated  - Continue pulmonary toilet  - Home incruse ellipta held  - Daily SBT  - Daily ABG  - Plan to start trach talks 3/10 if still unable to liberate from ventilator    #Pseudomonas Pneumonia   #VAP  - see ID for abx plan  - Plan for therapeutic bronch 3/8    FEN/GI:  F: Medlocked, bolus PRN  E: Replete electrolytes PRN  N: NPO   - NG to LIWS  - Restart TPN 3/8  - GI prophylaxis: zofran prn for nausea; IV Protonix   - bowel regimen: hold miralax, senna in setting of SBO    #S/p ex-lap, reduction of parastomal hernia, small bowel resection with primary anastomosis, lysis of adhesions  - 3/7 repeat imaging concerning for ongoing/new SBO  - Medical management, NG to LIWS, bowel rest  - No tentative OR at this time  - If does not have ROBF by 3/10 or 3/11 consider re-scanning    #Large hernia, chronic  - Non-operative, chronic  - re-eval on CT chest/abd/pelvis 3/7 stable    Renal/Genitourinary:  #AKI on CKD, prerenal:   - Cr downtrending on CRRT, continued oliguria  - RUS : Bilateral renal cysts measuring up to 2.6 cm in the right upper pole and 1.9 cm left interpolar kidney     #uremia  -CRRT started early AM 3/8  - Monitor for clinical signs of improvement  - Appreciate Nephrology's recs  - Low suspicion     #Hydronephrosis:  - Caution with perioperative fluid resuscitation per Cards   - RUS completed:  No hydronephrosis.     #H/o bladder cancer s/p radical cystectomy with ileal conduit (2012):  #Parastomal hernia:  - SRU consulted, signed off 3/7  - Strict I&Os  - Foley catheter per stoma, if it falls out, please notify Urology to replace      Endocrine:   #hyperglycemia   - Glycemic Control: SSI, 2 units NPH BID, monitor closely with addition of TPN     Hematologic:   - Monitor CBC daily  - Hb stable. No concerns for active bleeding.      Immunologic/Infectious Disease:  - Afebrile, leukocytosis remains elevated  - Cultures:   - Ucx (2/26): Mixed GP/GN organisms    - RPP (2/27): negative   - Blood cultures (3/4): NGTD @72h    - BAL (3/4): Pseudomonas aeruginosa  - Abx:    - Ceftriaxone 2/27- 2/28 - colonized UTI   - Zosyn (3/1 - 3/5) for abdominal contamination  - Zosyn (3/6) for pseudomonas when sensitivities pending  - Meropenum started (3/6-3/13) for Psedumonas PNA- susceptibilities confirm sensitives       Musculoskeletal:   NAI    Daily Care Checklist:   - Stress Ulcer Prevention: Yes: Mechanically ventilated  - DVT Prophylaxis: Chemical: Heparin drip  - Daily Awakening: Yes  - Spontaneous Breathing Trial: Yes  - Indication for Central/PICC Line: Yes  Infusions requiring central access, Hemodynamic monitoring, and Inadequate peripheral access, RIJ triple lumen  - Indication for Urinary Catheter: Yes  Strict intake and output, Agressive diuresis/hydration, and Urinary retention/obstruction  - Diagnostic images/reports of past 24hrs reviewed: Yes    Disposition:   - Continue ICU care  - PT/OT consulted:  will consult post extubation    Axel Meas is critically ill due to acute kidney injury, altered mental status, anemia - acute blood loss, and atrial fibrillation, SBO, open abdomen, postoperative mechanical ventilation. This critical care time includes  examining the patient, evaluating the hemodynamic, laboratory, and radiographic data, independently developing a comprehensive management plan, and serially assessing the patient's response to these critical care interventions. This critical care time excludes procedures.     Critical care time: 90 minutes     Katina Degree, ACNP   Surgical Intensive Care Unit  [place] [facility] [place] [facility]       SUBJECTIVE:      Intubated/sedated     OBJECTIVE:     Physical Exam:  Constitutional: Lying supine, NAD  Neurologic: Grimaces to painful stimuli, turns head to verbal. Moves LLE to command, LUE spontaneously. Pupils equal and responsive to light.   Respiratory: Mechanically ventilated via ETT   Cardiovascular: Tachycardic, irregular rhythm, MAPs > 65 on NE@1   Gastrointestinal: Mildly distended abdomen, midline incision dressing c/d/i. Urostomy with Foley in stoma draining clear yellow urine.   Musculoskeletal: Trace to no BLE/BUE edema.  Skin: Warm and dry     Temp:  [36.2 ??C (97.2 ??F)-37.2 ??C (99 ??F)] 36.2 ??C (97.2 ??F)  Heart Rate:  [103-126] 122  SpO2 Pulse:  [96-129] 118  Resp:  [16-30] 21  BP: (85-141)/(55-89) 87/64  MAP (mmHg):  [64-105] 69  A BP-1: (74-136)/(45-87) 78/48  MAP:  [55 mmHg-103 mmHg] 56 mmHg  FiO2 (%):  [40 %] 40 %  SpO2:  [94 %-100 %] 99 %       Recent Laboratory Results:  Recent Labs     Units 03/06/24  0904   PHART  7.31*   PCO2ART mm Hg 32.4*   PO2ART mm Hg 117.0*   HCO3ART mmol/L 16*   BEART  -9.1* O2SATART % 98.6     Recent Labs     Units 03/06/24  1524 03/06/24  1953 03/07/24  0414   NA mmol/L 142 142 144   K mmol/L 4.1 4.0 3.4   CL mmol/L 106 106 110*   CO2 mmol/L 16.0* 17.0* 17.0*   BUN mg/dL 606* 301* 601*   CREATININE mg/dL 0.93* 2.35* 5.73*   GLU mg/dL 220* 254* 270*     Lab Results   Component Value Date    BILITOT 0.2 (L) 03/03/2024    BILITOT 0.5 02/26/2024    BILIDIR 0.10 03/03/2024    ALT 9 (L) 03/03/2024    ALT 12 03/01/2024    AST 22 03/03/2024    AST 12 03/01/2024    GGT 16 09/17/2011    GGT 354 (H) 05/03/2011    ALKPHOS 58 03/03/2024    ALKPHOS 101 02/26/2024    PROT 5.5 (L) 03/03/2024    PROT 9.2 (H) 02/26/2024    ALBUMIN 2.4 (L) 03/03/2024    ALBUMIN 4.5 02/26/2024     Recent Labs     Units 03/06/24  1153 03/06/24  1726 03/06/24  2340 03/07/24  0549   POCGLU mg/dL 623* 762* 831 517     Recent Labs     Units 03/06/24  0627 03/06/24  1524 03/07/24  0414   WBC 10*9/L 22.0* 22.1* 19.3*   RBC 10*12/L 3.43* 3.33* 3.07*   HGB g/dL 9.3* 8.9* 8.5*   HCT % 29.2* 28.1* 25.9*   MCV fL 84.9 84.5 84.2   MCH pg 27.2 26.9 27.7   MCHC g/dL 61.6 07.3* 71.0   RDW % 18.8* 18.6* 18.2*   PLT 10*9/L 303 297 244   MPV fL 9.7 9.7 9.2     Recent Labs     Units 03/07/24  0414  APTT sec 40.2*      Lines & Tubes:   Patient Lines/Drains/Airways Status       Active Peripheral & Central Intravenous Access       Name Placement date Placement time Site Days    Peripheral IV 03/04/24 Anterior;Proximal;Right Forearm 03/04/24  0021  Forearm  3    Peripheral IV 03/07/24 Left Hand 03/07/24  0100  Hand  less than 1    Hemodialysis Catheter With Distal Infusion Port 03/06/24 Right Internal jugular 1.2 mL 1.2 mL 03/06/24  2301  Internal jugular  less than 1                     Patient Lines/Drains/Airways Status       Active Wounds       Name Placement date Placement time Site Days    Surgical Site 04/08/18 Shoulder Left 04/08/18  1338  -- 2159    Surgical Site 06/05/22 Eye Left 06/05/22  0928  -- 640    Surgical Site 06/19/22 Eye Right 06/19/22  1024  -- 626    Surgical Site 03/03/24 Abdomen 03/03/24  1013  -- 3    Wound 06/24/20 Other (comment) Buttocks inner right buttock 06/24/20  1010  Buttocks  1351                     Respiratory/ventilator settings for last 24 hours:   Vent Mode: PRVC  FiO2 (%): 40 %  S RR: 18  S VT: 300 mL  PEEP: 8 cm H20  PR SUP: 5 cm H20    Intake/Output last 3 shifts:  I/O last 3 completed shifts:  In: 2497.7 [I.V.:1007.7; NG/GT:1110; IV Piggyback:380]  Out: 1512 [Urine:485; Emesis/NG output:2; Drains:1025]    Daily/Recent Weight:  68.9 kg (151 lb 14.4 oz)    BMI:  Body mass index is 28.72 kg/m??.          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    Chronic kidney disease     Depression, psychotic (CMS-HCC)     Diabetes mellitus (CMS-HCC)     in past    Emphysema of lung (CMS-HCC)     Financial difficulties     Frail elderly     Hearing impairment     Hernia     History of transfusion     Hyperlipidemia     Hypertension     Impaired mobility     Osteoporosis     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/08/2015    Visual impairment      Past Surgical History:   Procedure Laterality Date    ABDOMINAL SURGERY      BLADDER SURGERY      BREAST CYST EXCISION      CHEMOTHERAPY  2012    bladder    GALLBLADDER SURGERY      stone removal    ILEOSTOMY  2012    PR COLONOSCOPY FLX DX W/COLLJ SPEC WHEN PFRMD N/A 02/09/2015    Procedure: COLONOSCOPY, FLEXIBLE, PROXIMAL TO SPLENIC FLEXURE; DIAGNOSTIC, W/WO COLLECTION SPECIMEN BY BRUSH OR WASH;  Surgeon: Dewaine Conger, MD;  Location: [facility] ;  Service: Gastroenterology    PR EXPLORATORY OF ABDOMEN N/A 02/28/2024  Procedure: EXPLORATORY LAPAROTOMY, EXPLORATORY CELIOTOMY WITH OR WITHOUT BIOPSY(S);  Surgeon: Renda Rolls, MD;  Location: [facility];  Service: General Surgery    PR RECONSTR TOTAL SHOULDER IMPLANT Left 04/08/2018    Procedure: ARTHROPLASTY, GLENOHUMERAL JOINT; TOTAL SHOULDER(GLENOID & PROXIMAL HUMERAL REPLACEMENT(EG, TOTAL SHOULDER);  Surgeon: Tomasa Rand, MD;  [facility];  Service: Ortho Sports Medicine    PR REOPEN RECENT ABD EXPLORATORY Midline 03/01/2024    Procedure: REOPENING OF RECENT LAPAROTOMY;  Surgeon: [name]., MD;  Location: OR [facility];  Service: Trauma    PR REOPEN RECENT ABD EXPLORATORY N/A 03/03/2024    Procedure: REOPENING OF RECENT LAPAROTOMY;  Surgeon: Joanie Coddington, MD;  Location: OR [facility];  Service: Trauma    PR SIGMOIDOSCOPY,BIOPSY N/A 03/11/2015    Procedure: SIGMOIDOSCOPY, FLEXIBLE; WITH BIOPSY, SINGLE OR MULTIPLE;  Surgeon: Wilburt Finlay, MD;  [facility];  Service: Gastroenterology    PR XCAPSL CTRC RMVL INSJ IO LENS PROSTH W/O ECP Left 06/05/2022    Procedure: EXTRACAPSULAR CATARACT REMOVAL W/INSERTION OF INTRAOCULAR LENS PROSTHESIS, MANUAL OR MECHANICAL TECHNIQUE WITHOUT ENDOSCOPIC CYCLOPHOTOCOAGULATION;  Surgeon: Garner Gavel, MD;  Location: [facility] OR [facility];  Service: Ophthalmology    PR XCAPSL CTRC RMVL INSJ IO LENS PROSTH W/O ECP Right 06/19/2022    Procedure: EXTRACAPSULAR CATARACT REMOVAL W/INSERTION OF INTRAOCULAR LENS PROSTHESIS, MANUAL OR MECHANICAL TECHNIQUE WITHOUT ENDOSCOPIC CYCLOPHOTOCOAGULATION;  Surgeon: Garner Gavel, MD;  Location: [facility] OR [facility];  Service: Ophthalmology     Scheduled Medications:   [Provider Hold] aspirin  81 mg Enteral tube: gastric Daily    [Provider Hold] atorvastatin  40 mg Enteral tube: gastric Daily    budesonide (PULMICORT) nebulizer solution  0.5 mg Nebulization BID (RT)    flu vacc ts2024-25(43yr up)-PF  0.5 mL Intramuscular During hospitalization    [START ON 03/14/2024] fluPHENAZine decanoate  50 mg Intramuscular Q30 Days    insulin NPH  2 Units Subcutaneous Q12H SCH    insulin regular  0-20 Units Subcutaneous Q6H SCH    iohexol  50 mL Oral Once    ipratropium  500 mcg Nebulization Q6H (RT)    meropenem  500 mg Intravenous Q24H    pantoprazole (Protonix) intravenous solution  40 mg Intravenous Daily    [Provider Hold] polyethylene glycol  17 g Oral Daily    [Provider Hold] sennosides  2.5 mL Oral Nightly     Continuous Infusions:   amiodarone 0.5 mg/min (03/07/24 0413)    heparin 13 Units/kg/hr (03/07/24 0505)    NORepinephrine bitartrate-NS 2 mcg/min (03/07/24 0626)    NxStage RFP 400 (+/- BB) 5000 mL - contains 2 mEq/L of potassium      NxStage/multiBic RFP 401 (+/- BB) 5000 mL - contains 4 mEq/L of potassium       PRN Medications:  albuterol, [Provider Hold] cyclobenzaprine, dextrose in water, heparin (porcine), HYDROmorphone **OR** HYDROmorphone, nicotine polacrilex **OR** nicotine polacrilex, ondansetron

## 2024-03-07 NOTE — Unmapped (Signed)
 CVAD Liaison - Aborted or Unsuccessful Line Insertion Note    CVAD Liaison Nurse was available for CVC placement.  Line placement was aborted by Dr. Rigoberto Noel MD and   Heber Hamilton PA.  Report was given to the Primary Nurse.      Thank you for this consult,  Ivery Quale, RN, CVAD Liaison    Consult Time  75 minutes (min)

## 2024-03-07 NOTE — Unmapped (Signed)
 Patient ETT rotated , re taped x2 through out shift. Assisted Dr with Julieta Bellini, sent off sputum samples. Patient currently on PRVC (see chart for settings). All emergency equipment at bedside.  Problem: Mechanical Ventilation Invasive  Goal: Effective Communication  Outcome: Ongoing - Unchanged  Goal: Optimal Device Function  Outcome: Ongoing - Unchanged  Intervention: Optimize Device Care and Function  Recent Flowsheet Documentation  Taken 03/07/2024 1409 by Serina Cowper, RRT  Oral Care: suction provided  Taken 03/07/2024 0816 by Serina Cowper, RRT  Oral Care:   lip/mouth moisturizer applied   mouth swabbed   oral rinse provided   suction provided   teeth brushed   tongue brushed  Goal: Mechanical Ventilation Liberation  Outcome: Ongoing - Unchanged  Goal: Optimal Nutrition Delivery  Outcome: Ongoing - Unchanged  Goal: Absence of Device-Related Skin and Tissue Injury  Outcome: Ongoing - Unchanged  Goal: Absence of Ventilator-Induced Lung Injury  Outcome: Ongoing - Unchanged  Intervention: Prevent Ventilator-Associated Pneumonia  Recent Flowsheet Documentation  Taken 03/07/2024 1409 by Serina Cowper, RRT  Head of Bed [facility]) Positioning: HOB at 30-45 degrees  Oral Care: suction provided  Taken 03/07/2024 0816 by Serina Cowper, RRT  Head of Bed (HOB) Positioning: HOB at 30-45 degrees  Oral Care:   lip/mouth moisturizer applied   mouth swabbed   oral rinse provided   suction provided   teeth brushed   tongue brushed     Problem: Artificial Airway  Goal: Effective Communication  Outcome: Ongoing - Unchanged  Goal: Optimal Device Function  Outcome: Ongoing - Unchanged  Intervention: Optimize Device Care and Function  Recent Flowsheet Documentation  Taken 03/07/2024 1409 by Serina Cowper, RRT  Oral Care: suction provided  Taken 03/07/2024 0816 by Serina Cowper, RRT  Oral Care:   lip/mouth moisturizer applied   mouth swabbed   oral rinse provided   suction provided   teeth brushed   tongue brushed  Goal: Absence of Device-Related Skin or Tissue Injury  Outcome: Ongoing - Unchanged

## 2024-03-07 NOTE — Unmapped (Signed)
 Adult Nutrition Brief Note      Visit Type: MD Consult  Reason for Visit: Parenteral Nutrition    Patient currently followed by nutrition and last seen 3/6. Patient previously on PN however transitioned to tube feeds. Now with small bowel obstruction and appropriate to restart PN 3/8 via non tunnelled CVC.    Follow-Up Parameters:   Monitor daily while on TPN

## 2024-03-07 NOTE — Unmapped (Signed)
 [facility]U PROGRESS NOTE     Date of Service: 03/07/2024    Hospital Day: LOS: 9 days        Surgery Date: TBD   Surgical Attending: Merri Ray, MD    Critical Care Attending: Thalia Party, MD    Interval History:   Replace RIJ with trialysis, unsuccessfully attempted placing LIJ. CRRT was started overnight. Started 2u NPH overnight in setting of persistent hyperglycemia despite holding TFs. No longer requiring pressors.     History of Present Illness:   Tammy Braun is a 83 y.o. female with PMH bladder cancer with cystectomy and ileal conduit, COPD, recent dx of afib, moderate-severe TR, CAD (high calcification score), T2DM presented to [facility] with projectile vomiting and abdominal pain, found to have SBO with transition point at level of ileostomy on CT A/P. On initial presentation, labs remarkable for leukocytosis thought to be iso hemoconcentration, AKI on CKD, mildly elevated venous lactate improved with resuscitation. Admitted to [facility] for worsening hypoxia on HFNC requiring intubation in setting of hiatal hernia. OR on 02/28/24 for ex-lap, reduciton of parastomal hernia, small bowel resection with primary anastomosis, lysis of adhesions, abdomen left open with ABThera in place.     Hospital Course:  - 02/26/24: Admitted to [facility], floor status, consult to urology for possible ileal conduit revision.   - 02/27/24: Patient level of care escalated to step-down iso increased oxygen needs while in ED.  - 02/28/24: Upgraded to ICU. Intubated at bed-side. OR for ex-lap, reduction of parastomal hernia, small bowel resection with primary anastomosis, lysis of adhesions, abdomen left open with ABThera in place.   - 03/01/24: OR for attempt at abdominal closure. Left open due to desaturations during closure attempt. ABThera in place.  - 03/03/24: RTOR for abdominal closure.     ASSESSMENT & PLAN:     Neurologic:  - Pain: HM IV PRN 0.5-1mg , tylenol    > start additional MMPC agents when neurostatus improves  - Sedation: none    #AMS, somnolence  - Differentials include stroke (ischemic vs hemorrhagic - low suspicion), metabolic encephalopathy 2/2 drug induced, vs uremia, vs infection   - Off sedation since 3/4   - GCS 6T  - Obtain Head CT STAT  - CT chest, abd, pelvis  - Neph consult for possible dialysis   - Hold all sedating drugs     Cardiovascular:  #HTN, HLD:   - Chronic.    - MAP goal > 65    > requiring NE, low dose, wean as able  - Hold home ASA and Lipitor while NPO    #Afib/flutter w RVR:   - Prior hx of paroxysmal afib. CHADSvasc = 6, not anticoagulated PTA per chart review.   - Continue amio gtt, heparin gtt started iso PE   - Holding home diltiazem, question efficacy given likely poor absorption  - Amio load will complete Monday 3/10 AM - after load, start either 150mg  IV amio daily or 200mg  PO daily (if OK to use gut)  - Echo 3/1: LVEF > 55%. Severe TR.  - Pro-BNP: 6,988 on 3/3     #Pulmonary embolism  - 3/5 CTA chest: Filling defect in L anterior segmental artery  - heparin gtt     #Acute thrombus right basilic   - Identified 3/3 on BUE duplex.  - Supportive care, superficial, AC not required     Respiratory:  #Acute on chronic hypoxemia  #Hx of COPD  - 2/28: Intubated  - Continue pulmonary toilet  -  Home incruse ellipta held  - Daily SBT  - Daily ABG    #Pseudomonas Pneumonia   #VAP  - see ID for abx plan      FEN/GI:  F: Medlocked, bolus PRN  E: Replete electrolytes PRN  N: NPO   - TFs held overnight 2/2 emesis, NG to LIWS  - GI prophylaxis: zofran prn for nausea; IV Protonix   - bowel regimen: hold miralax, senna in setting off possible ileus ; give suppository     #S/p ex-lap, reduction of parastomal hernia, small bowel resection with primary anastomosis, lysis of adhesions, abdomen left open with ABThera in place 2/28    #Large hiatal hernia, chronic  - Non-operative, chronic  - re-eval on CT chest/abd/pelvis    Renal/Genitourinary:  #AKI on CKD, prerenal:   - Cr continues to uptrend  - Progressing oliguria  - RUS completed: Bilateral renal cysts measuring up to 2.6 cm in the right upper pole and 1.9 cm left interpolar kidney   - Plan for nephro consult today -plan to  start CRRT for uremia    #Hydronephrosis:  - Caution with perioperative fluid resuscitation per Cards   - RUS completed:  No hydronephrosis.     #H/o bladder cancer s/p radical cystectomy with ileal conduit (2012):  #Parastomal hernia:  - SRU consulted, signed off 3/7  - Strict I&Os  - Foley catheter per stoma, if it falls out, please notify Urology to replace      Endocrine:   #hyperglycemia   - Glycemic Control: SSI    Hematologic:   - Monitor CBC daily  - Hb stable. No concerns for active bleeding.      Immunologic/Infectious Disease:  - Afebrile, leukocytosis to 22  - Cultures:   - Ucx (2/26): Mixed GP/GN organisms    - RPP (2/27): negative   - Blood cultures (3/4): NGTD @48h    - BAL (3/4): Pseudomonas aeruginosa  - Abx:    - Zosyn (3/1 - 3/5) for abdominal contamination  - Zosyn (3/6) for pseudomonas when sensitivities pending  - Meropenum started 3/6 for Psedumonas PNA- susceptibilities confirm sensitives       Musculoskeletal:   NAI    Daily Care Checklist:   - Stress Ulcer Prevention: Yes: Mechanically ventilated  - DVT Prophylaxis: Chemical: Heparin drip  - Daily Awakening: Yes  - Spontaneous Breathing Trial: Yes  - Indication for Central/PICC Line: Yes  Infusions requiring central access, Hemodynamic monitoring, and Inadequate peripheral access, RIJ triple lumen  - Indication for Urinary Catheter: Yes  Strict intake and output, Agressive diuresis/hydration, and Urinary retention/obstruction  - Diagnostic images/reports of past 24hrs reviewed: Yes    Disposition:   - Continue ICU care  - PT/OT consulted:  will consult post extubation    Tammy Braun is critically ill due to acute kidney injury, altered mental status, anemia - acute blood loss, and atrial fibrillation, SBO, open abdomen, postoperative mechanical ventilation. This critical care time includes examining the patient, evaluating the hemodynamic, laboratory, and radiographic data, independently developing a comprehensive management plan, and serially assessing the patient's response to these critical care interventions. This critical care time excludes procedures.     Critical care time: 90 minutes     Tammy Pili, PA   Surgical Intensive Care Unit  [facility]       SUBJECTIVE:      Intubated/sedated     OBJECTIVE:     Physical Exam:  Constitutional: Lying  supine, NAD  Neurologic: Grimaces to painful stimuli, turns head to verbal. Pupils equal and responsive to light.   Respiratory: Mechanically ventilated via ETT   Cardiovascular: Tachycardic, irregular rhythm, MAPs > 65 on NE@1   Gastrointestinal: Mildly distended abdomen, midline incision dressing c/d/i. Urostomy with Foley in stoma draining clear yellow urine.   Musculoskeletal: Trace to no BLE/BUE edema.  Skin: Warm and dry     Temp:  [36.4 ??C (97.6 ??F)-37.2 ??C (99 ??F)] 36.5 ??C (97.7 ??F)  Heart Rate:  [103-129] 114  SpO2 Pulse:  [96-129] 119  Resp:  [16-30] 20  BP: (87-141)/(55-89) 90/57  MAP (mmHg):  [64-105] 64  A BP-1: (85-136)/(47-87) 135/65  MAP:  [59 mmHg-105 mmHg] 86 mmHg  FiO2 (%):  [40 %] 40 %  SpO2:  [94 %-100 %] 96 %       Recent Laboratory Results:  Recent Labs     Units 03/06/24  0904   PHART  7.31*   PCO2ART mm Hg 32.4*   PO2ART mm Hg 117.0*   HCO3ART mmol/L 16*   BEART  -9.1*   O2SATART % 98.6     Recent Labs     Units 03/06/24  1154 03/06/24  1524 03/06/24  1953   NA mmol/L 142 142 142   K mmol/L 4.1 4.1 4.0   CL mmol/L 107 106 106   CO2 mmol/L 17.0* 16.0* 17.0*   BUN mg/dL 161* 096* 045*   CREATININE mg/dL 4.09* 8.11* 9.14*   GLU mg/dL 782* 956* 213*     Lab Results   Component Value Date    BILITOT 0.2 (L) 03/03/2024    BILITOT 0.5 02/26/2024    BILIDIR 0.10 03/03/2024    ALT 9 (L) 03/03/2024    ALT 12 03/01/2024    AST 22 03/03/2024    AST 12 03/01/2024    GGT 16 09/17/2011    GGT 354 (H) 05/03/2011    ALKPHOS 58 03/03/2024    ALKPHOS 101 02/26/2024    PROT 5.5 (L) 03/03/2024    PROT 9.2 (H) 02/26/2024    ALBUMIN 2.4 (L) 03/03/2024    ALBUMIN 4.5 02/26/2024     Recent Labs     Units 03/06/24  0903 03/06/24  1153 03/06/24  1726 03/06/24  2340   POCGLU mg/dL 086* 578* 469* 629     Recent Labs     Units 03/05/24  1553 03/06/24  0627 03/06/24  1524   WBC 10*9/L 24.7* 22.0* 22.1*   RBC 10*12/L 3.41* 3.43* 3.33*   HGB g/dL 9.4* 9.3* 8.9*   HCT % 29.0* 29.2* 28.1*   MCV fL 84.8 84.9 84.5   MCH pg 27.5 27.2 26.9   MCHC g/dL 52.8 41.3 24.4*   RDW % 18.6* 18.8* 18.6*   PLT 10*9/L 256 303 297   MPV fL 9.6 9.7 9.7     Recent Labs     Units 03/06/24  0627   APTT sec 48.0*      Lines & Tubes:   Patient Lines/Drains/Airways Status       Active Peripheral & Central Intravenous Access       Name Placement date Placement time Site Days    Peripheral IV 03/04/24 Anterior;Proximal;Right Forearm 03/04/24  0021  Forearm  3    Peripheral IV 03/07/24 Left Hand 03/07/24  0100  Hand  less than 1    Hemodialysis Catheter With Distal Infusion Port 03/06/24 Right Internal jugular 1.2 mL 1.2 mL 03/06/24  2301  Internal  jugular  less than 1                     Patient Lines/Drains/Airways Status       Active Wounds       Name Placement date Placement time Site Days    Surgical Site 04/08/18 Shoulder Left 04/08/18  1338  -- 2159    Surgical Site 06/05/22 Eye Left 06/05/22  0928  -- 640    Surgical Site 06/19/22 Eye Right 06/19/22  1024  -- 626    Surgical Site 03/03/24 Abdomen 03/03/24  1013  -- 3    Wound 06/24/20 Other (comment) Buttocks inner right buttock 06/24/20  1010  Buttocks  1351                     Respiratory/ventilator settings for last 24 hours:   Vent Mode: PRVC  FiO2 (%): 40 %  S RR: 18  S VT: 300 mL  PEEP: 8 cm H20  PR SUP: 10 cm H20    Intake/Output last 3 shifts:  I/O last 3 completed shifts:  In: 2497.7 [I.V.:1007.7; NG/GT:1110; IV Piggyback:380]  Out: 1512 [Urine:485; Emesis/NG output:2; Drains:1025]    Daily/Recent Weight:  68.9 kg (151 lb 14.4 oz)    BMI:  Body mass index is 28.72 kg/m??.          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    Chronic kidney disease     Depression, psychotic (CMS-HCC)     Diabetes mellitus (CMS-HCC)     in past    Emphysema of lung (CMS-HCC)     Financial difficulties     Frail elderly     Hearing impairment     Hernia     History of transfusion     Hyperlipidemia     Hypertension     Impaired mobility     Osteoporosis     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/08/2015    Visual impairment      Past Surgical History:   Procedure Laterality Date    ABDOMINAL SURGERY      BLADDER SURGERY      BREAST CYST EXCISION      CHEMOTHERAPY  2012    bladder    GALLBLADDER SURGERY      stone removal    ILEOSTOMY  2012    PR COLONOSCOPY FLX DX W/COLLJ SPEC WHEN PFRMD N/A 02/09/2015    Procedure: COLONOSCOPY, FLEXIBLE, PROXIMAL TO SPLENIC FLEXURE; DIAGNOSTIC, W/WO COLLECTION SPECIMEN BY BRUSH OR WASH;  Surgeon: Dewaine Conger, MD;  Location: [facility] [place];  Service: Gastroenterology    PR EXPLORATORY OF ABDOMEN N/A 02/28/2024    Procedure: EXPLORATORY LAPAROTOMY, EXPLORATORY CELIOTOMY WITH OR WITHOUT BIOPSY(S);  Surgeon: Renda Rolls, MD;  Location: [facility] OR [facility];  Service: General Surgery    PR RECONSTR TOTAL SHOULDER IMPLANT Left 04/08/2018    Procedure: ARTHROPLASTY, GLENOHUMERAL JOINT; TOTAL SHOULDER(GLENOID & PROXIMAL HUMERAL REPLACEMENT(EG, TOTAL SHOULDER);  Surgeon: Tomasa Rand, MD;  [facility]: [facility] OR [facility];  Service: Ortho Sports Medicine    PR REOPEN RECENT ABD EXPLORATORY Midline 03/01/2024    Procedure: REOPENING OF RECENT LAPAROTOMY;  Surgeon: [name]., MD;  Location: OR [facility];  Service: Trauma    PR REOPEN RECENT ABD EXPLORATORY N/A 03/03/2024    Procedure: REOPENING OF RECENT LAPAROTOMY;  Surgeon: Joanie Coddington, MD;  Location: OR [facility];  Service: Trauma    PR SIGMOIDOSCOPY,BIOPSY N/A 03/11/2015    Procedure: SIGMOIDOSCOPY, FLEXIBLE; WITH BIOPSY, SINGLE OR MULTIPLE;  Surgeon: Wilburt Finlay, MD;  [facility];  Service: Gastroenterology    PR XCAPSL CTRC RMVL INSJ IO LENS PROSTH W/O ECP Left 06/05/2022    Procedure: EXTRACAPSULAR CATARACT REMOVAL W/INSERTION OF INTRAOCULAR LENS PROSTHESIS, MANUAL OR MECHANICAL TECHNIQUE WITHOUT ENDOSCOPIC CYCLOPHOTOCOAGULATION;  Surgeon: Garner Gavel, MD;  Location: [facility] OR [facility];  Service: Ophthalmology    PR XCAPSL CTRC RMVL INSJ IO LENS PROSTH W/O ECP Right 06/19/2022    Procedure: EXTRACAPSULAR CATARACT REMOVAL W/INSERTION OF INTRAOCULAR LENS PROSTHESIS, MANUAL OR MECHANICAL TECHNIQUE WITHOUT ENDOSCOPIC CYCLOPHOTOCOAGULATION;  Surgeon: Garner Gavel, MD;  Location: [facility] OR [facility];  Service: Ophthalmology     Scheduled Medications:   [Provider Hold] aspirin  81 mg Enteral tube: gastric Daily    [Provider Hold] atorvastatin  40 mg Enteral tube: gastric Daily    budesonide (PULMICORT) nebulizer solution  0.5 mg Nebulization BID (RT)    flu vacc ts2024-25(50yr up)-PF  0.5 mL Intramuscular During hospitalization    [START ON 03/14/2024] fluPHENAZine decanoate  50 mg Intramuscular Q30 Days    insulin NPH  2 Units Subcutaneous Q12H SCH    insulin regular  0-20 Units Subcutaneous Q6H SCH    iohexol  50 mL Oral Once    ipratropium  500 mcg Nebulization Q6H (RT)    meropenem  500 mg Intravenous Q24H    pantoprazole (Protonix) intravenous solution  40 mg Intravenous Daily    [Provider Hold] polyethylene glycol  17 g Oral Daily    [Provider Hold] sennosides  2.5 mL Oral Nightly     Continuous Infusions:   amiodarone 0.5 mg/min (03/06/24 1800)    heparin 12 Units/kg/hr (03/07/24 0130)    NORepinephrine bitartrate-NS Stopped (03/07/24 0212)    NxStage RFP 400 (+/- BB) 5000 mL - contains 2 mEq/L of potassium      NxStage/multiBic RFP 401 (+/- BB) 5000 mL - contains 4 mEq/L of potassium       PRN Medications:  albuterol, [Provider Hold] cyclobenzaprine, dextrose in water, heparin (porcine), HYDROmorphone **OR** HYDROmorphone, nicotine polacrilex **OR** nicotine polacrilex, ondansetron

## 2024-03-07 NOTE — Unmapped (Addendum)
 Shift summary: Pt remains intubated, PRVC 40% w/greenish secretions.  Drowsy, not following commands but intermittently nods head appropriately.  Afib 100s-120s, remains on amio.  On and off levo for MAP goal >65; labile BP and sensitive to levo rate changes.  Hep gtt infusing, increased per nomogram (paused for 3hr overnight during line change due to limited IV access/incompatible gtts).  NG to LIWS with green output, one small emesis episode overnight.  HOB >30.  RIJ trialysis line placed this shift, started on CRRT this am; UF 100.  UOP remains low.  Family at bedside.    Problem: Adult Inpatient Plan of Care  Goal: Plan of Care Review  Outcome: Ongoing - Unchanged  Goal: Patient-Specific Goal (Individualized)  Outcome: Ongoing - Unchanged  Goal: Absence of Hospital-Acquired Illness or Injury  Outcome: Ongoing - Unchanged  Intervention: Identify and Manage Fall Risk  Recent Flowsheet Documentation  Taken 03/07/2024 0400 by Brayton Mars, RN  Safety Interventions: low bed  Taken 03/07/2024 0200 by Brayton Mars, RN  Safety Interventions: low bed  Taken 03/07/2024 0000 by Brayton Mars, RN  Safety Interventions: low bed  Taken 03/06/2024 2000 by Brayton Mars, RN  Safety Interventions:   aspiration precautions   bed alarm   fall reduction program maintained  Intervention: Prevent Skin Injury  Recent Flowsheet Documentation  Taken 03/07/2024 0400 by Brayton Mars, RN  Positioning for Skin: Right  Taken 03/07/2024 0200 by Brayton Mars, RN  Positioning for Skin: Left  Taken 03/07/2024 0000 by Brayton Mars, RN  Positioning for Skin: Supine/Back  Taken 03/06/2024 2200 by Brayton Mars, RN  Positioning for Skin: Right  Taken 03/06/2024 2000 by Brayton Mars, RN  Positioning for Skin: Left  Device Skin Pressure Protection:   absorbent pad utilized/changed   pressure points protected  Skin Protection: adhesive use limited  Intervention: Prevent and Manage VTE (Venous Thromboembolism) Risk  Recent Flowsheet Documentation  Taken 03/07/2024 0400 by Brayton Mars, RN  Anti-Embolism Device Type: SCD, Knee  Anti-Embolism Device Status: On  Anti-Embolism Device Location: BLE  Taken 03/07/2024 0200 by Brayton Mars, RN  Anti-Embolism Device Type: SCD, Knee  Anti-Embolism Device Status: On  Anti-Embolism Device Location: BLE  Taken 03/07/2024 0000 by Brayton Mars, RN  Anti-Embolism Device Type: SCD, Knee  Anti-Embolism Device Status: On  Anti-Embolism Device Location: BLE  Taken 03/06/2024 2200 by Brayton Mars, RN  Anti-Embolism Device Type: SCD, Knee  Anti-Embolism Device Status: On  Anti-Embolism Device Location: BLE  Taken 03/06/2024 2000 by Brayton Mars, RN  Anti-Embolism Device Type: SCD, Knee  Anti-Embolism Device Status: On  Anti-Embolism Device Location: BLE  Intervention: Prevent Infection  Recent Flowsheet Documentation  Taken 03/06/2024 2000 by Brayton Mars, RN  Infection Prevention:   cohorting utilized   hand hygiene promoted  Goal: Optimal Comfort and Wellbeing  Outcome: Ongoing - Unchanged  Goal: Readiness for Transition of Care  Outcome: Ongoing - Unchanged  Goal: Rounds/Family Conference  Outcome: Ongoing - Unchanged     Problem: Infection  Goal: Absence of Infection Signs and Symptoms  Outcome: Ongoing - Unchanged  Intervention: Prevent or Manage Infection  Recent Flowsheet Documentation  Taken 03/06/2024 2000 by Brayton Mars, RN  Infection Management: aseptic technique maintained     Problem: Wound  Goal: Optimal Coping  Outcome: Ongoing - Unchanged  Goal: Optimal Functional Ability  Outcome: Ongoing - Unchanged  Intervention: Optimize Functional Ability  Recent Flowsheet Documentation  Taken  03/07/2024 0400 by Brayton Mars, RN  Activity Management: bedrest  Taken 03/07/2024 0000 by Brayton Mars, RN  Activity Management: bedrest  Taken 03/06/2024 2000 by Brayton Mars, RN  Activity Management: bedrest  Goal: Absence of Infection Signs and Symptoms  Outcome: Ongoing - Unchanged  Intervention: Prevent or Manage Infection  Recent Flowsheet Documentation  Taken 03/06/2024 2000 by Brayton Mars, RN  Infection Management: aseptic technique maintained  Goal: Improved Oral Intake  Outcome: Ongoing - Unchanged  Goal: Optimal Pain Control and Function  Outcome: Ongoing - Unchanged  Goal: Skin Health and Integrity  Outcome: Ongoing - Unchanged  Intervention: Optimize Skin Protection  Recent Flowsheet Documentation  Taken 03/07/2024 0400 by Brayton Mars, RN  Activity Management: bedrest  Pressure Reduction Techniques: weight shift assistance provided  Taken 03/07/2024 0000 by Brayton Mars, RN  Activity Management: bedrest  Head of Bed [facility]) Positioning: (procedure) HOB flat  Taken 03/06/2024 2000 by Brayton Mars, RN  Activity Management: bedrest  Pressure Reduction Techniques: weight shift assistance provided  Head of Bed (HOB) Positioning: HOB at 30-45 degrees  Pressure Reduction Devices:   alternating pressure pump (APP)   foam padding utilized   heel offloading device utilized   positioning supports utilized   pressure-redistributing mattress utilized  Skin Protection: adhesive use limited  Goal: Optimal Wound Healing  Outcome: Ongoing - Unchanged     Problem: Mechanical Ventilation Invasive  Goal: Effective Communication  Outcome: Ongoing - Unchanged  Goal: Optimal Device Function  Outcome: Ongoing - Unchanged  Intervention: Optimize Device Care and Function  Recent Flowsheet Documentation  Taken 03/07/2024 0400 by Brayton Mars, RN  Oral Care:   mouth swabbed   oral rinse provided   suction provided  Taken 03/07/2024 0000 by Brayton Mars, RN  Oral Care:   mouth swabbed   oral rinse provided   suction provided   tongue brushed   teeth brushed  Goal: Mechanical Ventilation Liberation  Outcome: Ongoing - Unchanged  Goal: Optimal Nutrition Delivery  Outcome: Ongoing - Unchanged  Goal: Absence of Device-Related Skin and Tissue Injury  Outcome: Ongoing - Unchanged  Intervention: Maintain Skin and Tissue Health  Recent Flowsheet Documentation  Taken 03/06/2024 2000 by Brayton Mars, RN  Device Skin Pressure Protection:   absorbent pad utilized/changed   pressure points protected  Goal: Absence of Ventilator-Induced Lung Injury  Outcome: Ongoing - Unchanged  Intervention: Prevent Ventilator-Associated Pneumonia  Recent Flowsheet Documentation  Taken 03/07/2024 0400 by Brayton Mars, RN  Oral Care:   mouth swabbed   oral rinse provided   suction provided  Taken 03/07/2024 0000 by Brayton Mars, RN  Head of Bed [facility]) Positioning: (procedure) HOB flat  Oral Care:   mouth swabbed   oral rinse provided   suction provided   tongue brushed   teeth brushed  Taken 03/06/2024 2000 by Brayton Mars, RN  Head of Bed [facility]) Positioning: HOB at 30-45 degrees

## 2024-03-07 NOTE — Unmapped (Signed)
 Neuro: Drowsy. A&O x4 when asked yes or no questions. Follows commands.  Resp: 7 ETT, pulled out to 18 at the gums per order by RT. On vent. Bronch done at bedside and cultures sent. Oral and inline suctioning provided.   Cardiac: Afib on monitor. Amio and heparin gtts running at therapeutic rate. CRRT restarted with UF at 150. Levo titrated to maintain MAP >65. A-line in place.  GI/GU: NGT at 58cm and on LIWS- no output noted. Glucose checked. Urostomy in place and connected to foley. Bag and wafer changed. 1 BM this shift.  Skin: Midline incision. JP x2. Ostomy on RLQ. Wound care provided and dressings changed.  Access: New CVAD in R neck placed and verified. HD cath in R neck. PIV x2.     All precautions followed. Q2 turns provided. ICU status maintained.     Problem: Adult Inpatient Plan of Care  Goal: Absence of Hospital-Acquired Illness or Injury  Intervention: Identify and Manage Fall Risk  Recent Flowsheet Documentation  Taken 03/07/2024 0800 by Murriel Hopper, RN  Safety Interventions:   aspiration precautions   bed alarm   commode/urinal/bedpan at bedside   family at bedside   infection management   lighting adjusted for tasks/safety   low bed  Intervention: Prevent Skin Injury  Recent Flowsheet Documentation  Taken 03/07/2024 1600 by Murriel Hopper, RN  Positioning for Skin: Right  Taken 03/07/2024 1400 by Murriel Hopper, RN  Positioning for Skin: Left  Taken 03/07/2024 1200 by Murriel Hopper, RN  Positioning for Skin: Right  Taken 03/07/2024 1000 by Murriel Hopper, RN  Positioning for Skin: Left  Taken 03/07/2024 0800 by Murriel Hopper, RN  Positioning for Skin: Right  Device Skin Pressure Protection:   pressure points protected   positioning supports utilized  Skin Protection:   adhesive use limited   incontinence pads utilized   tubing/devices free from skin contact   transparent dressing maintained  Intervention: Prevent and Manage VTE (Venous Thromboembolism) Risk  Recent Flowsheet Documentation  Taken 03/07/2024 1600 by Murriel Hopper, RN  Anti-Embolism Device Type: SCD, Knee  Anti-Embolism Device Status: On  Anti-Embolism Device Location: BLE  Taken 03/07/2024 1400 by Murriel Hopper, RN  Anti-Embolism Device Type: SCD, Knee  Anti-Embolism Device Status: On  Anti-Embolism Device Location: BLE  Taken 03/07/2024 1200 by Murriel Hopper, RN  Anti-Embolism Device Type: SCD, Knee  Anti-Embolism Device Status: On  Anti-Embolism Device Location: BLE  Taken 03/07/2024 1000 by Murriel Hopper, RN  Anti-Embolism Device Type: SCD, Knee  Anti-Embolism Device Status: On  Anti-Embolism Device Location: BLE  Taken 03/07/2024 0800 by Murriel Hopper, RN  Anti-Embolism Device Type: SCD, Knee  Anti-Embolism Device Status: On  Anti-Embolism Device Location: BLE  Intervention: Prevent Infection  Recent Flowsheet Documentation  Taken 03/07/2024 0800 by Murriel Hopper, RN  Infection Prevention: hand hygiene promoted     Problem: Infection  Goal: Absence of Infection Signs and Symptoms  Intervention: Prevent or Manage Infection  Recent Flowsheet Documentation  Taken 03/07/2024 0800 by Murriel Hopper, RN  Infection Management: aseptic technique maintained     Problem: Fall Injury Risk  Goal: Absence of Fall and Fall-Related Injury  Intervention: Promote Injury-Free Environment  Recent Flowsheet Documentation  Taken 03/07/2024 0800 by Murriel Hopper, RN  Safety Interventions:   aspiration precautions   bed alarm   commode/urinal/bedpan at bedside   family at bedside   infection management   lighting adjusted for tasks/safety   low bed     Problem: Wound  Goal: Absence of  Infection Signs and Symptoms  Intervention: Prevent or Manage Infection  Recent Flowsheet Documentation  Taken 03/07/2024 0800 by Murriel Hopper, RN  Infection Management: aseptic technique maintained  Goal: Skin Health and Integrity  Intervention: Optimize Skin Protection  Recent Flowsheet Documentation  Taken 03/07/2024 1600 by Murriel Hopper, RN  Head of Bed [place]) Positioning: HOB at 30-45 degrees  Taken 03/07/2024 1400 by Murriel Hopper, RN  Head of Bed [facility]) Positioning: HOB at 30-45 degrees  Taken 03/07/2024 1200 by Murriel Hopper, RN  Head of Bed [facility]) Positioning: HOB at 30-45 degrees  Taken 03/07/2024 1000 by Murriel Hopper, RN  Head of Bed [facility]) Positioning: HOB at 30-45 degrees  Taken 03/07/2024 0800 by Murriel Hopper, RN  Pressure Reduction Techniques:   weight shift assistance provided   frequent weight shift encouraged  Head of Bed (HOB) Positioning: HOB at 30-45 degrees  Pressure Reduction Devices:   pressure-redistributing mattress utilized   positioning supports utilized  Skin Protection:   adhesive use limited   incontinence pads utilized   tubing/devices free from skin contact   transparent dressing maintained     Problem: Skin Injury Risk Increased  Goal: Skin Health and Integrity  Intervention: Optimize Skin Protection  Recent Flowsheet Documentation  Taken 03/07/2024 1600 by Murriel Hopper, RN  Head of Bed [facility]) Positioning: HOB at 30-45 degrees  Taken 03/07/2024 1400 by Murriel Hopper, RN  Head of Bed [facility]) Positioning: HOB at 30-45 degrees  Taken 03/07/2024 1200 by Murriel Hopper, RN  Head of Bed [facility]) Positioning: HOB at 30-45 degrees  Taken 03/07/2024 1000 by Murriel Hopper, RN  Head of Bed [facility]) Positioning: HOB at 30-45 degrees  Taken 03/07/2024 0800 by Murriel Hopper, RN  Pressure Reduction Techniques:   weight shift assistance provided   frequent weight shift encouraged  Head of Bed (HOB) Positioning: HOB at 30-45 degrees  Pressure Reduction Devices:   pressure-redistributing mattress utilized   positioning supports utilized  Skin Protection:   adhesive use limited   incontinence pads utilized   tubing/devices free from skin contact   transparent dressing maintained     Problem: Mechanical Ventilation Invasive  Goal: Optimal Device Function  Intervention: Optimize Device Care and Function  Recent Flowsheet Documentation  Taken 03/07/2024 1600 by Murriel Hopper, RN  Oral Care:   lip/mouth moisturizer applied   mouth swabbed   oral rinse provided   suction provided   teeth brushed   tongue brushed  Taken 03/07/2024 1200 by Murriel Hopper, RN  Oral Care:   lip/mouth moisturizer applied   mouth swabbed   oral rinse provided   suction provided   teeth brushed   tongue brushed  Taken 03/07/2024 0800 by Murriel Hopper, RN  Oral Care: suction provided  Goal: Absence of Device-Related Skin and Tissue Injury  Intervention: Maintain Skin and Tissue Health  Recent Flowsheet Documentation  Taken 03/07/2024 0800 by Murriel Hopper, RN  Device Skin Pressure Protection:   pressure points protected   positioning supports utilized  Goal: Absence of Ventilator-Induced Lung Injury  Intervention: Prevent Ventilator-Associated Pneumonia  Recent Flowsheet Documentation  Taken 03/07/2024 1600 by Murriel Hopper, RN  Head of Bed [facility]) Positioning: HOB at 30-45 degrees  Oral Care:   lip/mouth moisturizer applied   mouth swabbed   oral rinse provided   suction provided   teeth brushed   tongue brushed  Taken 03/07/2024 1400 by Murriel Hopper, RN  Head of Bed [facility]) Positioning: HOB at 30-45 degrees  Taken 03/07/2024 1200 by Murriel Hopper, RN  Head of Bed [facility]) Positioning: HOB at 30-45 degrees  Oral Care:   lip/mouth moisturizer applied   mouth swabbed   oral rinse provided   suction provided   teeth brushed   tongue brushed  Taken 03/07/2024 1000 by Murriel Hopper, RN  Head of Bed [facility]) Positioning: HOB at 30-45 degrees  Taken 03/07/2024 0800 by Murriel Hopper, RN  Head of Bed [facility]) Positioning: HOB at 30-45 degrees  Oral Care: suction provided     Problem: Artificial Airway  Goal: Optimal Device Function  Intervention: Optimize Device Care and Function  Recent Flowsheet Documentation  Taken 03/07/2024 1600 by Murriel Hopper, RN  Oral Care:   lip/mouth moisturizer applied   mouth swabbed   oral rinse provided   suction provided   teeth brushed   tongue brushed  Taken 03/07/2024 1200 by Murriel Hopper, RN  Oral Care:   lip/mouth moisturizer applied   mouth swabbed   oral rinse provided   suction provided   teeth brushed   tongue brushed  Taken 03/07/2024 0800 by Murriel Hopper, RN  Aspiration Precautions:   NPO pending swallow screening/evaluation   oral hygiene care promoted   respiratory status monitored   tube feeding placement verified   upright posture maintained  Oral Care: suction provided  Goal: Absence of Device-Related Skin or Tissue Injury  Intervention: Maintain Skin and Tissue Health  Recent Flowsheet Documentation  Taken 03/07/2024 0800 by Murriel Hopper, RN  Device Skin Pressure Protection:   pressure points protected   positioning supports utilized

## 2024-03-07 NOTE — Unmapped (Signed)
 [facility] BRONCHOSCOPY NOTE     DATE OF SERVICE: 03/07/2024     PROCEDURE:  BRONCHOSCOPY     INDICATION:  Abnormal CXR, Pulmonary toilet, and BAL    DIAGNOSIS:  Pneumonia     CONSENT:   yes    TIME OUT:   Yes, correct patient, side, site, procedure, position, equipment               ANESTHESIA:  Endotracheal lidocaine and Already sedated with fentanyl     DESCRIPTION:        Endotracheal lidocaine was applied.  The patient was positioned appropriately and the bronchoscope was passed through the ETT. The scope was then passed into the trachea. Careful inspection of the tracheal lumen was accomplished.  The scope was sequentially passed into the left main and then left upper and lower bronchi and segmental bronchi.  The scope was then withdrawn and advanced into the right main bronchus and then into the RUL, RML, and RLL bronchi and segmental bronchi. Therapeutic aspiration was performed to remove mucous plugs from RUL, RML, RLL, LLL.  The bronchial lumens were patent after the intervention.     FINDINGS:   Mucosa: Edematous mucosa and Collapse     Secretions: Copious (significant amount of secretions)  Thick white and yellow     R>L       SPECIMENS SENT:  BAL from RUL and LLL    COMPLICATIONS:  no complications were noted    POST-PROCEDURE DIAGNOSIS:  Same    ATTESTATION:  I performed the procedure     SIGNATURE:             Katina Degree, ACNP

## 2024-03-07 NOTE — Unmapped (Signed)
 [facility] CENTRAL VENOUS LINE NOTE     DATE OF SERVICE: 03/07/2024     PROCEDURE:  Central venous line insertion    INDICATION:  poor peripheral access/inability to maintain peripheral access, medications requiring central access for administration, and TPN    DIAGNOSIS:  SBO requiring TPN    CONSENT:   yes    TIME OUT:   Yes, correct patient, side, site, procedure, position, equipment    LINE TYPE:   Triple Lumen    POSITION:                 supine      SITE:                          left    ULTRASOUND:        Yes               ANESTHESIA:  1% buffered lidocaine    DESCRIPTION:           The patient was positioned appropriately. Operator masked, gowned, and gloved in sterile fashion. Chlorhexidine used to sterilize the skin and full body sterile drapes were applied.  Ultrasound was used to identify the vessel and lidocaine was used to anesthetize the surrounding skin area. The vein was identified via ultrasound and an introducer needle was advanced until the flash chamber was seen to fill with blood. The catheter advanced into the vessel while the needle was withdrawn.  Venous placement was confirmed via manometry.  A guide wire was inserted into the vessel and ultrasound was used to confirm intraluminal placement.  The catheter was withdrawn and a skin incision was made.  Using Seldinger technique, the SQ tissue was dilated and the Triple Lumen Catheter  was placed.  The guide wire was removed and discarded. The ports were aspirated for good blood return and flushed with normal saline. The catheter was secured with suture and a CHG dressing placed.  A CXR was ordered to verify placement.    FINDINGS:   n/a    SPECIMENS SENT:  None    COMPLICATIONS:  NONE    BLOOD LOSS:  Minimal    POST-PROCEDURE DIAGNOSIS:  Same    ATTESTATION:  I was present and scrubbed for the entire procedure     SIGNATURE:             Franky Macho, MD

## 2024-03-08 LAB — BASIC METABOLIC PANEL
ANION GAP: 15 mmol/L — ABNORMAL HIGH (ref 5–14)
ANION GAP: 12 mmol/L (ref 5–14)
ANION GAP: 13 mmol/L (ref 5–14)
BLOOD UREA NITROGEN: 62 mg/dL — ABNORMAL HIGH (ref 9–23)
BLOOD UREA NITROGEN: 50 mg/dL — ABNORMAL HIGH (ref 9–23)
BLOOD UREA NITROGEN: 44 mg/dL — ABNORMAL HIGH (ref 9–23)
BUN / CREAT RATIO: 28
BUN / CREAT RATIO: 30
BUN / CREAT RATIO: 31
CALCIUM: 8.2 mg/dL — ABNORMAL LOW (ref 8.7–10.4)
CALCIUM: 7.8 mg/dL — ABNORMAL LOW (ref 8.7–10.4)
CALCIUM: 8.6 mg/dL — ABNORMAL LOW (ref 8.7–10.4)
CHLORIDE: 104 mmol/L (ref 98–107)
CHLORIDE: 105 mmol/L (ref 98–107)
CHLORIDE: 98 mmol/L (ref 98–107)
CO2: 21 mmol/L (ref 20.0–31.0)
CO2: 22 mmol/L (ref 20.0–31.0)
CO2: 26 mmol/L (ref 20.0–31.0)
CREATININE: 2.23 mg/dL — ABNORMAL HIGH (ref 0.55–1.02)
CREATININE: 1.67 mg/dL — ABNORMAL HIGH (ref 0.55–1.02)
CREATININE: 1.41 mg/dL — ABNORMAL HIGH (ref 0.55–1.02)
EGFR CKD-EPI (2021) FEMALE: 22 mL/min/{1.73_m2} — ABNORMAL LOW (ref >=60)
EGFR CKD-EPI (2021) FEMALE: 31 mL/min/{1.73_m2} — ABNORMAL LOW (ref >=60)
EGFR CKD-EPI (2021) FEMALE: 38 mL/min/{1.73_m2} — ABNORMAL LOW (ref >=60)
GLUCOSE RANDOM: 207 mg/dL — ABNORMAL HIGH (ref 70–99)
GLUCOSE RANDOM: 214 mg/dL — ABNORMAL HIGH (ref 70–179)
GLUCOSE RANDOM: 277 mg/dL — ABNORMAL HIGH (ref 70–179)
POTASSIUM: 3.4 mmol/L (ref 3.4–4.8)
POTASSIUM: 3.6 mmol/L (ref 3.4–4.8)
POTASSIUM: 3.8 mmol/L (ref 3.4–4.8)
SODIUM: 140 mmol/L (ref 135–145)
SODIUM: 139 mmol/L (ref 135–145)
SODIUM: 137 mmol/L (ref 135–145)

## 2024-03-08 LAB — PHOSPHORUS
PHOSPHORUS: 3.5 mg/dL (ref 2.4–5.1)
PHOSPHORUS: 2.5 mg/dL (ref 2.4–5.1)

## 2024-03-08 LAB — BLOOD GAS CRITICAL CARE PANEL, ARTERIAL
BASE EXCESS ARTERIAL: -1.5 (ref -2.0–2.0)
CALCIUM IONIZED ARTERIAL (MG/DL): 4.89 mg/dL (ref 4.40–5.40)
GLUCOSE WHOLE BLOOD: 201 mg/dL — ABNORMAL HIGH (ref 70–179)
HCO3 ARTERIAL: 23 mmol/L (ref 22–27)
HEMOGLOBIN BLOOD GAS: 7.9 g/dL — ABNORMAL LOW (ref 12.00–16.00)
LACTATE BLOOD ARTERIAL: 0.9 mmol/L (ref <1.3)
O2 SATURATION ARTERIAL: 99.8 % (ref 94.0–100.0)
PCO2 ARTERIAL: 36.6 mmHg (ref 35.0–45.0)
PH ARTERIAL: 7.41 (ref 7.35–7.45)
PO2 ARTERIAL: 171 mmHg — ABNORMAL HIGH (ref 80.0–110.0)
POTASSIUM WHOLE BLOOD: 3.5 mmol/L (ref 3.4–4.6)
SODIUM WHOLE BLOOD: 139 mmol/L (ref 135–145)

## 2024-03-08 LAB — CBC
HEMATOCRIT: 24.7 % — ABNORMAL LOW (ref 34.0–44.0)
HEMOGLOBIN: 8.3 g/dL — ABNORMAL LOW (ref 11.3–14.9)
MEAN CORPUSCULAR HEMOGLOBIN CONC: 33.6 g/dL (ref 32.0–36.0)
MEAN CORPUSCULAR HEMOGLOBIN: 28.1 pg (ref 25.9–32.4)
MEAN CORPUSCULAR VOLUME: 83.5 fL (ref 77.6–95.7)
MEAN PLATELET VOLUME: 8.8 fL (ref 6.8–10.7)
PLATELET COUNT: 164 10*9/L (ref 150–450)
RED BLOOD CELL COUNT: 2.95 10*12/L — ABNORMAL LOW (ref 3.95–5.13)
RED CELL DISTRIBUTION WIDTH: 18.3 % — ABNORMAL HIGH (ref 12.2–15.2)
WBC ADJUSTED: 23.9 10*9/L — ABNORMAL HIGH (ref 3.6–11.2)

## 2024-03-08 LAB — APTT
APTT: 52.7 s — ABNORMAL HIGH (ref 24.8–38.4)
HEPARIN CORRELATION: 0.3

## 2024-03-08 LAB — IONIZED CALCIUM VENOUS
CALCIUM IONIZED VENOUS (MG/DL): 4.65 mg/dL (ref 4.40–5.40)
CALCIUM IONIZED VENOUS (MG/DL): 4.79 mg/dL (ref 4.40–5.40)
CALCIUM IONIZED VENOUS (MG/DL): 4.79 mg/dL (ref 4.40–5.40)

## 2024-03-08 LAB — HEPATIC FUNCTION PANEL
ALBUMIN: 2 g/dL — ABNORMAL LOW (ref 3.4–5.0)
ALKALINE PHOSPHATASE: 53 U/L (ref 46–116)
ALT (SGPT): <7 U/L — ABNORMAL LOW (ref 10–49)
AST (SGOT): 10 U/L (ref <=34)
BILIRUBIN DIRECT: 0.1 mg/dL (ref 0.00–0.30)
BILIRUBIN TOTAL: 0.2 mg/dL — ABNORMAL LOW (ref 0.3–1.2)
PROTEIN TOTAL: 5.4 g/dL — ABNORMAL LOW (ref 5.7–8.2)

## 2024-03-08 LAB — MAGNESIUM
MAGNESIUM: 2 mg/dL (ref 1.6–2.6)
MAGNESIUM: 1.8 mg/dL (ref 1.6–2.6)
MAGNESIUM: 2.1 mg/dL (ref 1.6–2.6)

## 2024-03-08 LAB — TRIGLYCERIDES: TRIGLYCERIDES: 58 mg/dL (ref 0–150)

## 2024-03-08 MED ADMIN — meropenem (MERREM) 500 mg in sodium chloride 0.9 % (NS) 100 mL IVPB-MBP: 500 mg | INTRAVENOUS | @ 05:00:00 | Stop: 2024-03-12

## 2024-03-08 MED ADMIN — pantoprazole (Protonix) injection 40 mg: 40 mg | INTRAVENOUS | @ 13:00:00

## 2024-03-08 MED ADMIN — heparin 25,000 Units/250 mL (100 units/mL) in 0.45% saline infusion (premade): 0-24 [IU]/kg/h | INTRAVENOUS | @ 07:00:00

## 2024-03-08 MED ADMIN — sodium chloride (NS) 0.9 % flush 10 mL: 10 mL | INTRAVENOUS | @ 11:00:00

## 2024-03-08 MED ADMIN — aspirin chewable tablet 81 mg: 81 mg | GASTROENTERAL | @ 13:00:00

## 2024-03-08 MED ADMIN — amiodarone 360 mg in dextrose 5% 200 mL (1.8 mg/mL) infusion PMB: .5 mg/min | INTRAVENOUS | @ 21:00:00 | Stop: 2024-03-09

## 2024-03-08 MED ADMIN — insulin regular (HumuLIN,NovoLIN) injection 0-20 Units: 0-20 [IU] | SUBCUTANEOUS | @ 23:00:00

## 2024-03-08 MED ADMIN — insulin regular (HumuLIN,NovoLIN) injection 0-20 Units: 0-20 [IU] | SUBCUTANEOUS | @ 11:00:00

## 2024-03-08 MED ADMIN — acetaminophen (OFIRMEV) 10 mg/mL injection 1,000 mg: 1000 mg | INTRAVENOUS | @ 20:00:00 | Stop: 2024-03-09

## 2024-03-08 MED ADMIN — ipratropium (ATROVENT) 0.02 % nebulizer solution 500 mcg: 500 ug | RESPIRATORY_TRACT | @ 02:00:00

## 2024-03-08 MED ADMIN — meropenem (MERREM) 500 mg in sodium chloride 0.9 % (NS) 100 mL IVPB-MBP: 500 mg | INTRAVENOUS | @ 13:00:00 | Stop: 2024-03-12

## 2024-03-08 MED ADMIN — sodium chloride (NS) 0.9 % flush 10 mL: 10 mL | INTRAVENOUS | @ 18:00:00

## 2024-03-08 MED ADMIN — amiodarone 360 mg in dextrose 5% 200 mL (1.8 mg/mL) infusion PMB: .5 mg/min | INTRAVENOUS | @ 09:00:00 | Stop: 2024-03-09

## 2024-03-08 MED ADMIN — budesonide (PULMICORT) nebulizer solution 0.5 mg: .5 mg | RESPIRATORY_TRACT | @ 01:00:00

## 2024-03-08 MED ADMIN — sodium chloride (NS) 0.9 % flush 10 mL: 10 mL | INTRAVENOUS | @ 03:00:00

## 2024-03-08 MED ADMIN — ipratropium (ATROVENT) 0.02 % nebulizer solution 500 mcg: 500 ug | RESPIRATORY_TRACT | @ 10:00:00

## 2024-03-08 MED ADMIN — ipratropium (ATROVENT) 0.02 % nebulizer solution 500 mcg: 500 ug | RESPIRATORY_TRACT | @ 21:00:00

## 2024-03-08 MED ADMIN — acetaminophen (OFIRMEV) 10 mg/mL injection 1,000 mg: 1000 mg | INTRAVENOUS | @ 09:00:00 | Stop: 2024-03-08

## 2024-03-08 MED ADMIN — atorvastatin (LIPITOR) tablet 40 mg: 40 mg | GASTROENTERAL | @ 13:00:00

## 2024-03-08 MED ADMIN — meropenem (MERREM) 500 mg in sodium chloride 0.9 % (NS) 100 mL IVPB-MBP: 500 mg | INTRAVENOUS | @ 21:00:00 | Stop: 2024-03-12

## 2024-03-08 MED ADMIN — budesonide (PULMICORT) nebulizer solution 0.5 mg: .5 mg | RESPIRATORY_TRACT | @ 12:00:00

## 2024-03-08 MED ADMIN — insulin NPH (HumuLIN,NovoLIN) injection 5 Units: 5 [IU] | SUBCUTANEOUS | @ 13:00:00

## 2024-03-08 MED ADMIN — Parenteral Nutrition (CENTRAL): INTRAVENOUS | @ 03:00:00 | Stop: 2024-03-08

## 2024-03-08 MED ADMIN — acetaminophen (OFIRMEV) 10 mg/mL injection 1,000 mg: 1000 mg | INTRAVENOUS | @ 14:00:00 | Stop: 2024-03-09

## 2024-03-08 MED ADMIN — insulin regular (HumuLIN,NovoLIN) injection 0-20 Units: 0-20 [IU] | SUBCUTANEOUS | @ 05:00:00

## 2024-03-08 MED ADMIN — heparin (porcine) 1000 unit/mL injection 1,200 Units: 1200 [IU] | INTRAVENOUS

## 2024-03-08 MED ADMIN — magnesium sulfate 2gm/50mL IVPB: 2 g | INTRAVENOUS | @ 18:00:00 | Stop: 2024-03-08

## 2024-03-08 MED ADMIN — ipratropium (ATROVENT) 0.02 % nebulizer solution 500 mcg: 500 ug | RESPIRATORY_TRACT | @ 14:00:00

## 2024-03-08 MED ADMIN — insulin NPH (HumuLIN,NovoLIN) injection 2 Units: 2 [IU] | SUBCUTANEOUS | @ 03:00:00

## 2024-03-08 MED ADMIN — insulin regular (HumuLIN,NovoLIN) injection 0-20 Units: 0-20 [IU] | SUBCUTANEOUS | @ 16:00:00

## 2024-03-08 NOTE — Unmapped (Signed)
 hSTCCU PROGRESS NOTE     Date of Service: 03/08/2024    Hospital Day: LOS: 10 days        Surgery Date: TBD   Surgical Attending: Merri Ray, MD    Critical Care Attending: Thalia Party, MD    Interval History:   Ne 3-4 overnight with CRRT UF @ 150 (goal). Planning for extubation in morning. Patient's daughters refusing IV HM for pain, started on 2.5/5mg  Oxycodone PO. Spontaneously converted from ST ~100 to SB ~55bpm, EKG obtained demonstrating SB without acute ischemia or heart block.     History of Present Illness:   Tammy Braun is a 83 y.o. female with PMH bladder cancer with cystectomy and ileal conduit, COPD, recent dx of afib, moderate-severe TR, CAD (high calcification score), T2DM presented to [facility] with projectile vomiting and abdominal pain, found to have SBO with transition point at level of ileostomy on CT A/P. On initial presentation, labs remarkable for leukocytosis thought to be iso hemoconcentration, AKI on CKD, mildly elevated venous lactate improved with resuscitation. Admitted to [facility] for worsening hypoxia on HFNC requiring intubation in setting of hiatal hernia. OR on 02/28/24 for ex-lap, reduciton of parastomal hernia, small bowel resection with primary anastomosis, lysis of adhesions, abdomen left open with ABThera in place.     Hospital Course:  - 02/26/24: Admitted to [facility], floor status, consult to urology for possible ileal conduit revision.   - 02/27/24: Patient level of care escalated to step-down iso increased oxygen needs while in ED.  - 02/28/24: Upgraded to ICU. Intubated at bed-side. OR for ex-lap, reduction of parastomal hernia, small bowel resection with primary anastomosis, lysis of adhesions, abdomen left open with ABThera in place.   - 03/01/24: OR for attempt at abdominal closure. Left open due to desaturations during closure attempt. ABThera in place.  - 03/03/24: RTOR for abdominal closure.  -03/06/24: Vas cath placed, CRRT started      ASSESSMENT & PLAN:     Neurologic:  - Pain: Oxy tylenol    > start additional MMPC agents when neurostatus improves    #AMS, somnolence, improving    - Differentials include stroke (ischemic vs hemorrhagic - low suspicion), metabolic encephalopathy 2/2 drug induced, vs uremia, vs infection   - Off sedation since 3/4   - 3/7 Head CT without acute changes  - CRRT, although given clearing of mental status with continued elevated BUN - low suspicion of uremia induced encephalopathy  - Hold all sedating drugs     Cardiovascular:  #HTN, HLD:   - Chronic.    - MAP goal > 65    > NE ~3-7mcg/min   - Wean as able   - Ok for NE up to 2mcg/min to allow up to 150cc/hr UF on CRRT   - If NE >4, bring down UF  - Resume home ASA and Lipitor     #Afib/flutter w RVR:   - Prior hx of paroxysmal afib. CHADSvasc = 6, not anticoagulated PTA per chart review.   - Continue amio gtt, heparin gtt started iso PE   - Holding home diltiazem, question efficacy given likely poor absorption  - Amio load will complete Monday 3/10 AM - after load, 150mg  IV amio daily   - Echo 3/1: LVEF > 55%. Severe TR.  - Pro-BNP: 6,988 on 3/3     #Pulmonary embolism  - 3/5 CTA chest: Filling defect in L anterior segmental artery  - heparin gtt     #Acute thrombus right  basilic   - Identified 3/3 on BUE duplex  - Supportive care, superficial, AC not required     Respiratory:  #Acute on chronic hypoxemia  #Hx of COPD  - Mechanically ventilated  - Continue pulmonary toilet  - Home incruse ellipta held  - Daily SBT  - Daily ABG  - CXR daily while intubated   - Passing SBT, following commands  - Family refused extubation on 3/9, plan for extubation 3/10    #Pseudomonas Pneumonia   #VAP  - see ID for abx plan    FEN/GI:  F: Medlocked  E: Replete electrolytes PRN  N: NPO   - NG to LIWS while working toward extubation    - TPN  - GI prophylaxis: IV Protonix   - bowel regimen: none   Last BM: 3/9    #S/p ex-lap, reduction of parastomal hernia, small bowel resection with primary anastomosis, lysis of adhesions  - 3/7 repeat imaging concerning for ongoing/new SBO    > Continues with BMs  - Medical management, NG to LIWS, bowel rest      #Large hernia, chronic  - Non-operative, chronic  - re-eval on CT chest/abd/pelvis 3/7 stable    Renal/Genitourinary:  #AKI on CKD, prerenal:   - Cr downtrending on CRRT, continued oliguria  - RUS : Bilateral renal cysts measuring up to 2.6 cm in the right upper pole and 1.9 cm left interpolar kidney     #uremia  - CRRT UF @ 150    > if NE requirement increases to 4, then back UF to 100  - Monitor for clinical signs of improvement  - Appreciate Nephrology's assistance    #Hydronephrosis:  - Caution with perioperative fluid resuscitation per Cards   - RUS completed:  No hydronephrosis.     #H/o bladder cancer s/p radical cystectomy with ileal conduit (2012):  #Parastomal hernia:  - SRU consulted, signed off 3/7  - Strict I&Os  - Foley catheter per stoma, if it falls out, please notify Urology to replace      Endocrine:   #hyperglycemia   - Glycemic Control: SSI,  increase to 5 units NPH BID, monitor closely      Hematologic:   - Monitor CBC daily, transfuse for hgb 7mg /dL  - DVT ppx: heparin gtt    Immunologic/Infectious Disease:  - Afebrile  - Persistent leukocytosis, continue to monitor     - Cultures:   - Ucx (2/26): Mixed GP/GN organisms    - RPP (2/27): negative   - Blood cultures (3/4): NGTD @72h    - BAL (3/4): Pseudomonas aeruginosa   - BAL (3/8): 2+ on R, 1+ on L yeast    - Antimicrobials:    - Ceftriaxone 2/27- 2/28 - colonized UTI   - Zosyn (3/1 - 3/5) for abdominal contamination  - Zosyn (3/6) for pseudomonas when sensitivities pending  - Meropenum started (3/6-3/13) for Psedumonas PNA     Musculoskeletal:   #Deconditioned   - PT/OT when extubated     Daily Care Checklist:   - Stress Ulcer Prevention: Yes: Mechanically ventilated  - DVT Prophylaxis: Chemical: Heparin drip  - Daily Awakening: Yes  - Spontaneous Breathing Trial: Yes  - Indication for Central/PICC Line: Yes  Infusions requiring central access, Hemodynamic monitoring, and Inadequate peripheral access, RIJ triple lumen  - Indication for Urinary Catheter: Yes  Strict intake and output, Agressive diuresis/hydration, and Urinary retention/obstruction  - Diagnostic images/reports of past 24hrs reviewed: Yes    Disposition:   -  Continue ICU care  - PT/OT consulted:  will consult post extubation     SUBJECTIVE:      Intubated. Follows commands. Denies pain.     OBJECTIVE:     Physical Exam:  Constitutional: Lying supine, NAD  Neurologic: Opens eyes to voice. Moves all 4 extremities to command. Remains intubated. Pupils equal and responsive to light.   Respiratory: Mechanically ventilated, diminished bilaterally  Cardiovascular: Bradycardic ~55bpm, regular rhythm, +1 pulses in all extremities   Gastrointestinal: Soft abdomen, midline incision staples in tact. Urostomy with Foley in stoma draining clear yellow urine.   Musculoskeletal: Trace to no BLE/BUE edema.  Skin: Warm and dry     Temp:  [36 ??C (96.8 ??F)-36.6 ??C (97.8 ??F)] 36.2 ??C (97.1 ??F)  Heart Rate:  [89-114] 107  SpO2 Pulse:  [46-115] 110  Resp:  [13-25] 20  A BP-1: (83-138)/(48-86) 138/74  MAP:  [60 mmHg-98 mmHg] 98 mmHg  FiO2 (%):  [30 %-40 %] 30 %  SpO2:  [98 %-100 %] 99 %       Recent Laboratory Results:  Recent Labs     Units 03/08/24  0909   PHART  7.41   PCO2ART mm Hg 36.6   PO2ART mm Hg 171.0*   HCO3ART mmol/L 23   BEART  -1.5   O2SATART % 99.8     Recent Labs     Units 03/08/24  0357 03/08/24  0909 03/08/24  1200 03/08/24  2038   NA mmol/L 140 139 139 137   K mmol/L 3.4 3.5 3.6 3.8   CL mmol/L 104  --  105 98   CO2 mmol/L 21.0  --  22.0 26.0   BUN mg/dL 62*  --  50* 44*   CREATININE mg/dL 1.61*  --  0.96* 0.45*   GLU mg/dL 409*  --  811* 914*     Lab Results   Component Value Date    BILITOT 0.2 (L) 03/08/2024    BILITOT 0.2 (L) 03/03/2024    BILIDIR 0.10 03/08/2024    BILIDIR 0.10 03/03/2024    ALT <7 (L) 03/08/2024    ALT 9 (L) 03/03/2024    AST 10 03/08/2024    AST 22 03/03/2024    GGT 16 09/17/2011    GGT 354 (H) 05/03/2011    ALKPHOS 53 03/08/2024    ALKPHOS 58 03/03/2024    PROT 5.4 (L) 03/08/2024    PROT 5.5 (L) 03/03/2024    ALBUMIN 2.0 (L) 03/08/2024    ALBUMIN 2.4 (L) 03/03/2024     Recent Labs     Units 03/08/24  0909 03/08/24  1159 03/08/24  1828 03/08/24  2037   POCGLU mg/dL 782 956* 213* 086*     Recent Labs     Units 03/07/24  0414 03/07/24  0941 03/08/24  0357   WBC 10*9/L 19.3* 21.9* 23.9*   RBC 10*12/L 3.07* 3.01* 2.95*   HGB g/dL 8.5* 8.2* 8.3*   HCT % 25.9* 25.5* 24.7*   MCV fL 84.2 84.7 83.5   MCH pg 27.7 27.3 28.1   MCHC g/dL 57.8 46.9 62.9   RDW % 18.2* 18.2* 18.3*   PLT 10*9/L 244 242 164   MPV fL 9.2 9.4 8.8     Recent Labs     Units 03/08/24  0357   APTT sec 52.7*      Lines & Tubes:   Patient Lines/Drains/Airways Status       Active Peripheral & Central Intravenous Access  Name Placement date Placement time Site Days    Peripheral IV 03/04/24 Anterior;Proximal;Right Forearm 03/04/24  0021  Forearm  4    Peripheral IV 03/07/24 Left Hand 03/07/24  0100  Hand  1    CVC Triple Lumen 03/07/24 Non-tunneled Left Internal jugular 03/07/24  0849  Internal jugular  1    Hemodialysis Catheter With Distal Infusion Port 03/06/24 Right Internal jugular 1.2 mL 1.2 mL 03/06/24  2301  Internal jugular  1                     Patient Lines/Drains/Airways Status       Active Wounds       Name Placement date Placement time Site Days    Surgical Site 04/08/18 Shoulder Left 04/08/18  1338  -- 2161    Surgical Site 06/05/22 Eye Left 06/05/22  0928  -- 642    Surgical Site 06/19/22 Eye Right 06/19/22  1024  -- 628    Surgical Site 03/03/24 Abdomen 03/03/24  1013  -- 5    Wound 06/24/20 Other (comment) Buttocks inner right buttock 06/24/20  1010  Buttocks  1353                     Respiratory/ventilator settings for last 24 hours:   Vent Mode: PSV-CPAP  FiO2 (%): 30 %  S RR: 18  S VT: 300 mL  PEEP: 8 cm H20  PR SUP: 10 cm H20    Intake/Output last 3 shifts:  I/O last 3 completed shifts:  In: 2500.9 [I.V.:1035.3; NG/GT:110; IV Piggyback:548.3]  Out: 4272 [Urine:205; Emesis/NG output:50; Drains:595; Other:3422]    Daily/Recent Weight:  68.9 kg (151 lb 14.4 oz)    BMI:  Body mass index is 28.72 kg/m??.          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    Chronic kidney disease     Depression, psychotic (CMS-HCC)     Diabetes mellitus (CMS-HCC)     in past    Emphysema of lung (CMS-HCC)     Financial difficulties     Frail elderly     Hearing impairment     Hernia     History of transfusion     Hyperlipidemia     Hypertension     Impaired mobility     Osteoporosis     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/08/2015    Visual impairment      Past Surgical History:   Procedure Laterality Date    ABDOMINAL SURGERY      BLADDER SURGERY      BREAST CYST EXCISION      CHEMOTHERAPY  2012    bladder    GALLBLADDER SURGERY      stone removal    ILEOSTOMY  2012    PR COLONOSCOPY FLX DX W/COLLJ SPEC WHEN PFRMD N/A 02/09/2015    Procedure: COLONOSCOPY, FLEXIBLE, PROXIMAL TO SPLENIC FLEXURE; DIAGNOSTIC, W/WO COLLECTION SPECIMEN BY BRUSH OR WASH;  Surgeon: Dewaine Conger, MD;  Location: [facility] [place];  Service: Gastroenterology    PR EXPLORATORY OF ABDOMEN N/A 02/28/2024    Procedure: EXPLORATORY LAPAROTOMY, EXPLORATORY CELIOTOMY WITH OR WITHOUT BIOPSY(S);  Surgeon: Renda Rolls, MD;  Location: [facility] OR [facility];  Service: General Surgery    PR RECONSTR TOTAL SHOULDER IMPLANT Left 04/08/2018    Procedure: ARTHROPLASTY, GLENOHUMERAL JOINT; TOTAL SHOULDER(GLENOID & PROXIMAL HUMERAL REPLACEMENT(EG, TOTAL SHOULDER);  Surgeon: Tomasa Rand, MD;  [facility];  Service: Ortho Sports Medicine    PR REOPEN RECENT ABD EXPLORATORY Midline 03/01/2024    Procedure: REOPENING OF RECENT LAPAROTOMY;  Surgeon: [name]., MD;  Location: OR [facility];  Service: Trauma    PR REOPEN RECENT ABD EXPLORATORY N/A 03/03/2024    Procedure: REOPENING OF RECENT LAPAROTOMY;  Surgeon: Joanie Coddington, MD;  Location: OR [facility];  Service: Trauma    PR SIGMOIDOSCOPY,BIOPSY N/A 03/11/2015    Procedure: SIGMOIDOSCOPY, FLEXIBLE; WITH BIOPSY, SINGLE OR MULTIPLE;  Surgeon: Wilburt Finlay, MD;  [facility];  Service: Gastroenterology    PR XCAPSL CTRC RMVL INSJ IO LENS PROSTH W/O ECP Left 06/05/2022    Procedure: EXTRACAPSULAR CATARACT REMOVAL W/INSERTION OF INTRAOCULAR LENS PROSTHESIS, MANUAL OR MECHANICAL TECHNIQUE WITHOUT ENDOSCOPIC CYCLOPHOTOCOAGULATION;  Surgeon: Garner Gavel, MD;  Location: [facility] OR [facility];  Service: Ophthalmology    PR XCAPSL CTRC RMVL INSJ IO LENS PROSTH W/O ECP Right 06/19/2022    Procedure: EXTRACAPSULAR CATARACT REMOVAL W/INSERTION OF INTRAOCULAR LENS PROSTHESIS, MANUAL OR MECHANICAL TECHNIQUE WITHOUT ENDOSCOPIC CYCLOPHOTOCOAGULATION;  Surgeon: Garner Gavel, MD;  Location: [facility] OR [facility];  Service: Ophthalmology     Scheduled Medications:   acetaminophen  1,000 mg Intravenous Q8H    [START ON 03/10/2024] amiodarone  150 mg Intravenous daily    aspirin  81 mg Enteral tube: gastric Daily    atorvastatin  40 mg Enteral tube: gastric Daily    budesonide (PULMICORT) nebulizer solution  0.5 mg Nebulization BID (RT)    fentaNYL (PF)  50 mcg Intravenous Once    flu vacc ts2024-25(85yr up)-PF  0.5 mL Intramuscular During hospitalization    [START ON 03/14/2024] fluPHENAZine decanoate  50 mg Intramuscular Q30 Days    insulin NPH  5 Units Subcutaneous Q12H SCH    insulin regular  0-20 Units Subcutaneous Q6H SCH    iohexol  50 mL Oral Once    ipratropium  500 mcg Nebulization Q6H (RT)    meropenem  500 mg Intravenous Q8H    pantoprazole (Protonix) intravenous solution  40 mg Intravenous Daily    [Provider Hold] polyethylene glycol  17 g Oral Daily    [Provider Hold] sennosides  2.5 mL Oral Nightly    sodium chloride  10 mL Intravenous Q8H    sodium chloride  10 mL Intravenous Q8H    sodium chloride  10 mL Intravenous Q8H     Continuous Infusions:   amiodarone 0.5 mg/min (03/08/24 1800)    heparin 13 Units/kg/hr (03/08/24 1800)    NORepinephrine bitartrate-NS 2 mcg/min (03/08/24 2120)    NxStage RFP 400 (+/- BB) 5000 mL - contains 2 mEq/L of potassium      NxStage/multiBic RFP 401 (+/- BB) 5000 mL - contains 4 mEq/L of potassium      Parenteral Nutrition (CENTRAL) Stopped (03/08/24 2207)    Parenteral Nutrition (CENTRAL) 40 mL/hr at 03/08/24 2155     PRN Medications:  albuterol, [Provider Hold] cyclobenzaprine, dextrose in water, heparin (porcine), heparin (porcine), nicotine polacrilex **OR** nicotine polacrilex, ondansetron, oxyCODONE **OR** oxyCODONE      Lainie Daubert is critically ill due to acute kidney injury, atrial fibrillation, bradycardia, hypotension, and pneumonia. This critical care time includes examining the  patient, evaluating the hemodynamic, laboratory, and radiographic data, independently developing a comprehensive management plan, and serially assessing the patient's response to these critical care interventions. This critical care time excludes procedures.    Critical care time: 30 minutes     Clide Cliff, NP   Surgical Intensive Care Unit  [place] [facility] [place] [facility]

## 2024-03-08 NOTE — Unmapped (Addendum)
 At 1200, Tammy Braun was deemed to be appropriate for extubation by both the surgical ICU and respiratory therapy teams. She demonstrated adequate oxygenation and ventilation by evidence of appropriate oxygen saturations and CO2 level on ABG after multiple hours on spontaneous ventilation. She follows commands and is able to lean her head forward.     Family at bedside was updated on readiness for extubation. At this time, Ms. Covin's daughter and HCDM, [name], adamantly refused extubation via telephone. Despite multiple STCCU providers explaining Ms. Ludwig's readiness to extubate and the multiple risks of prolonged extubation to her health (infection, stenosis, laryngeal edema, dysphagia, agitation), her daughter refused extubation today.     The team will revisit this discussion later this afternoon once her 4 daughters have had time to talk amongst themselves.     Oliver Hum, MD    Update 4PM:   Rediscussed our recommendation for extubation today with family. HCDM continues to refuse extubation today. Will start tube feeds and tentatively plan for extubation tomorrow AM if clinical status remains appropriate for extubation.

## 2024-03-08 NOTE — Unmapped (Signed)
 hSTCCU PROGRESS NOTE     Date of Service: 03/08/2024    Hospital Day: LOS: 10 days        Surgery Date: TBD   Surgical Attending: Merri Ray, MD    Critical Care Attending: Thalia Party, MD    Interval History:   Multiple bowel movements. CRRT remains running at UF 150, 1.4L off. Low dose NE required. Continues on Meropenem for Pseudamonas PNA. Passing SBT.    History of Present Illness:   Tammy Braun is a 83 y.o. female with PMH bladder cancer with cystectomy and ileal conduit, COPD, recent dx of afib, moderate-severe TR, CAD (high calcification score), T2DM presented to [facility] with projectile vomiting and abdominal pain, found to have SBO with transition point at level of ileostomy on CT A/P. On initial presentation, labs remarkable for leukocytosis thought to be iso hemoconcentration, AKI on CKD, mildly elevated venous lactate improved with resuscitation. Admitted to [facility] for worsening hypoxia on HFNC requiring intubation in setting of hiatal hernia. OR on 02/28/24 for ex-lap, reduciton of parastomal hernia, small bowel resection with primary anastomosis, lysis of adhesions, abdomen left open with ABThera in place.     Hospital Course:  - 02/26/24: Admitted to [facility], floor status, consult to urology for possible ileal conduit revision.   - 02/27/24: Patient level of care escalated to step-down iso increased oxygen needs while in ED.  - 02/28/24: Upgraded to ICU. Intubated at bed-side. OR for ex-lap, reduction of parastomal hernia, small bowel resection with primary anastomosis, lysis of adhesions, abdomen left open with ABThera in place.   - 03/01/24: OR for attempt at abdominal closure. Left open due to desaturations during closure attempt. ABThera in place.  - 03/03/24: RTOR for abdominal closure.  -03/06/24: Vas cath placed, CRRT started      ASSESSMENT & PLAN:     Neurologic:  - Pain: HM IV PRN 0.5-1mg , tylenol    > start additional MMPC agents when neurostatus improves  - Sedation: none    #AMS, somnolence, improving    - Differentials include stroke (ischemic vs hemorrhagic - low suspicion), metabolic encephalopathy 2/2 drug induced, vs uremia, vs infection   - Off sedation since 3/4   - 3/7 Head CT without acute changes  - CRRT, although given clearing of mental status with continued elevated BUN - low suspicion of uremia induced encephalopathy  - Hold all sedating drugs     Cardiovascular:  #HTN, HLD:   - Chronic.    - MAP goal > 65    > requiring NE @2www , low dose, wean as able  - Resume home ASA and Lipitor     #Afib/flutter w RVR:   - Prior hx of paroxysmal afib. CHADSvasc = 6, not anticoagulated PTA per chart review.   - Continue amio gtt, heparin gtt started iso PE   - Holding home diltiazem, question efficacy given likely poor absorption  - Amio load will complete Monday 3/10 AM - after load, 150mg  IV amio daily   - Echo 3/1: LVEF > 55%. Severe TR.  - Pro-BNP: 6,988 on 3/3     #Pulmonary embolism  - 3/5 CTA chest: Filling defect in L anterior segmental artery  - heparin gtt     #Acute thrombus right basilic   - Identified 3/3 on BUE duplex  - Supportive care, superficial, AC not required     Respiratory:  #Acute on chronic hypoxemia  #Hx of COPD  - 2/28: Intubated  - Continue pulmonary toilet  -  Home incruse ellipta held  - Daily SBT  - Daily ABG  - Passing SBT, following commands. Obtain ABG to assess readiness for extubation.     #Pseudomonas Pneumonia   #VAP  - see ID for abx plan    FEN/GI:  F: Medlocked, bolus PRN  E: Replete electrolytes PRN  N: NPO   - NG to LIWS while working toward extubation    - TPN  - GI prophylaxis: zofran prn for nausea; IV Protonix   - bowel regimen: hold miralax, senna given BMs just resumed post-obstruction    #S/p ex-lap, reduction of parastomal hernia, small bowel resection with primary anastomosis, lysis of adhesions  - 3/7 repeat imaging concerning for ongoing/new SBO    > BM x3 last night  - Medical management, NG to LIWS, bowel rest    - No tentative OR at this time    #Large hernia, chronic  - Non-operative, chronic  - re-eval on CT chest/abd/pelvis 3/7 stable    Renal/Genitourinary:  #AKI on CKD, prerenal:   - Cr downtrending on CRRT, continued oliguria  - RUS : Bilateral renal cysts measuring up to 2.6 cm in the right upper pole and 1.9 cm left interpolar kidney     #uremia  - CRRT UF @ 150    > if NE requirement increases to 4, then back UF to 100.   - Monitor for clinical signs of improvement  - Appreciate Nephrology's assistance    #Hydronephrosis:  - Caution with perioperative fluid resuscitation per Cards   - RUS completed:  No hydronephrosis.     #H/o bladder cancer s/p radical cystectomy with ileal conduit (2012):  #Parastomal hernia:  - SRU consulted, signed off 3/7  - Strict I&Os  - Foley catheter per stoma, if it falls out, please notify Urology to replace      Endocrine:   #hyperglycemia   - Glycemic Control: SSI,  increase to 5 units NPH BID, monitor closely      Hematologic:   - Monitor CBC daily  - Hb stable. No concerns for active bleeding    - DVT ppx: heparin gtt    Immunologic/Infectious Disease:  - Afebrile, persistent leukocytosis  - Cultures:   - Ucx (2/26): Mixed GP/GN organisms    - RPP (2/27): negative   - Blood cultures (3/4): NGTD @72h    - BAL (3/4): Pseudomonas aeruginosa   - BAL (3/8): 2+ on R, 1+ on L yeast    - Antimicrobials:    - Ceftriaxone 2/27- 2/28 - colonized UTI   - Zosyn (3/1 - 3/5) for abdominal contamination  - Zosyn (3/6) for pseudomonas when sensitivities pending  - Meropenum started (3/6-3/13) for Psedumonas PNA     Musculoskeletal:   NAI    Daily Care Checklist:   - Stress Ulcer Prevention: Yes: Mechanically ventilated  - DVT Prophylaxis: Chemical: Heparin drip  - Daily Awakening: Yes  - Spontaneous Breathing Trial: Yes  - Indication for Central/PICC Line: Yes  Infusions requiring central access, Hemodynamic monitoring, and Inadequate peripheral access, RIJ triple lumen  - Indication for Urinary Catheter: Yes  Strict intake and output, Agressive diuresis/hydration, and Urinary retention/obstruction  - Diagnostic images/reports of past 24hrs reviewed: Yes    Disposition:   - Continue ICU care  - PT/OT consulted:  will consult post extubation    Oliver Hum, MD   Surgical Intensive Care Unit  [facility] [place] [facility]       SUBJECTIVE:  Intubated. Follows commands. In no acute distress. Denies pain.     OBJECTIVE:     Physical Exam:  Constitutional: Lying supine, NAD  Neurologic: Opens eyes to voice. Moves all 4 extremities to command. Remains intubated. Pupils equal and responsive to light.   Respiratory: Mechanically ventilated via ETT   Cardiovascular: Tachycardic, irregular rhythm, MAPs > 65 on NE@2   Gastrointestinal: Soft abdomen, midline incision staples in tact. Urostomy with Foley in stoma draining clear yellow urine.   Musculoskeletal: Trace to no BLE/BUE edema.  Skin: Warm and dry     Temp:  [35.9 ??C (96.6 ??F)-36.8 ??C (98.3 ??F)] 36 ??C (96.8 ??F)  Heart Rate:  [84-124] 109  SpO2 Pulse:  [41-124] 74  Resp:  [16-29] 18  BP: (85-121)/(59-78) 121/78  MAP (mmHg):  [68-91] 91  A BP-1: (60-159)/(41-94) 122/66  MAP:  [46 mmHg-117 mmHg] 86 mmHg  FiO2 (%):  [40 %-100 %] 40 %  SpO2:  [97 %-100 %] 100 %       Recent Laboratory Results:  Recent Labs     Units 03/07/24  0941   PHART  7.34*   PCO2ART mm Hg 31.0*   PO2ART mm Hg 136.0*   HCO3ART mmol/L 17*   BEART  -8.1*   O2SATART % 98.8     Recent Labs     Units 03/07/24  1227 03/07/24  1935 03/08/24  0357   NA mmol/L 142 143 140   K mmol/L 3.3* 3.9 3.4   CL mmol/L 105 105 104   CO2 mmol/L 18.0* 20.0 21.0   BUN mg/dL 83* 66* 62*   CREATININE mg/dL 1.61* 0.96* 0.45*   GLU mg/dL 409 811* 914*     Lab Results   Component Value Date    BILITOT 0.2 (L) 03/08/2024    BILITOT 0.2 (L) 03/03/2024    BILIDIR 0.10 03/08/2024    BILIDIR 0.10 03/03/2024    ALT <7 (L) 03/08/2024    ALT 9 (L) 03/03/2024    AST 10 03/08/2024    AST 22 03/03/2024    GGT 16 09/17/2011    GGT 354 (H) 05/03/2011    ALKPHOS 53 03/08/2024    ALKPHOS 58 03/03/2024    PROT 5.4 (L) 03/08/2024    PROT 5.5 (L) 03/03/2024    ALBUMIN 2.0 (L) 03/08/2024    ALBUMIN 2.4 (L) 03/03/2024     Recent Labs     Units 03/07/24  1731 03/07/24  2144 03/07/24  2349 03/08/24  0536   POCGLU mg/dL 782 956 213* 086*     Recent Labs     Units 03/07/24  0414 03/07/24  0941 03/08/24  0357   WBC 10*9/L 19.3* 21.9* 23.9*   RBC 10*12/L 3.07* 3.01* 2.95*   HGB g/dL 8.5* 8.2* 8.3*   HCT % 25.9* 25.5* 24.7*   MCV fL 84.2 84.7 83.5   MCH pg 27.7 27.3 28.1   MCHC g/dL 57.8 46.9 62.9   RDW % 18.2* 18.2* 18.3*   PLT 10*9/L 244 242 164   MPV fL 9.2 9.4 8.8     Recent Labs     Units 03/07/24  1227 03/08/24  0357   APTT sec 62.6* 52.7*      Lines & Tubes:   Patient Lines/Drains/Airways Status       Active Peripheral & Central Intravenous Access       Name Placement date Placement time Site Days    Peripheral IV 03/04/24 Anterior;Proximal;Right Forearm 03/04/24  0021  Forearm  4    Peripheral IV 03/07/24 Left Hand 03/07/24  0100  Hand  1    CVC Triple Lumen 03/07/24 Non-tunneled Left Internal jugular 03/07/24  0849  Internal jugular  less than 1    Hemodialysis Catheter With Distal Infusion Port 03/06/24 Right Internal jugular 1.2 mL 1.2 mL 03/06/24  2301  Internal jugular  1                     Patient Lines/Drains/Airways Status       Active Wounds       Name Placement date Placement time Site Days    Surgical Site 04/08/18 Shoulder Left 04/08/18  1338  -- 2160    Surgical Site 06/05/22 Eye Left 06/05/22  0928  -- 641    Surgical Site 06/19/22 Eye Right 06/19/22  1024  -- 627    Surgical Site 03/03/24 Abdomen 03/03/24  1013  -- 4    Wound 06/24/20 Other (comment) Buttocks inner right buttock 06/24/20  1010  Buttocks  1352                     Respiratory/ventilator settings for last 24 hours:   Vent Mode: PRVC  FiO2 (%): 40 %  S RR: 18  S VT: 300 mL  PEEP: 8 cm H20  PR SUP: 5 cm H20    Intake/Output last 3 shifts:  I/O last 3 completed shifts:  In: 1693.7 [I.V.:938.7; NG/GT:80; IV Piggyback:675]  Out: 2885 [Urine:450; Emesis/NG output:577; Drains:585; Other:1273]    Daily/Recent Weight:  68.9 kg (151 lb 14.4 oz)    BMI:  Body mass index is 28.72 kg/m??.          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    Chronic kidney disease     Depression, psychotic (CMS-HCC)     Diabetes mellitus (CMS-HCC)     in past    Emphysema of lung (CMS-HCC)     Financial difficulties     Frail elderly     Hearing impairment     Hernia     History of transfusion     Hyperlipidemia     Hypertension     Impaired mobility     Osteoporosis     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/08/2015    Visual impairment      Past Surgical History:   Procedure Laterality Date    ABDOMINAL SURGERY      BLADDER SURGERY      BREAST CYST EXCISION      CHEMOTHERAPY  2012    bladder    GALLBLADDER SURGERY      stone removal    ILEOSTOMY  2012    PR COLONOSCOPY FLX DX W/COLLJ SPEC WHEN PFRMD N/A 02/09/2015    Procedure: COLONOSCOPY, FLEXIBLE, PROXIMAL TO SPLENIC FLEXURE; DIAGNOSTIC, W/WO COLLECTION SPECIMEN BY BRUSH OR WASH;  Surgeon: Dewaine Conger, MD;  Location: [facility];  Service: Gastroenterology    PR EXPLORATORY OF ABDOMEN N/A 02/28/2024    Procedure: EXPLORATORY LAPAROTOMY, EXPLORATORY CELIOTOMY WITH OR WITHOUT BIOPSY(S);  Surgeon: Renda Rolls, MD;  Location: [facility];  Service: General Surgery    PR RECONSTR TOTAL SHOULDER IMPLANT Left  04/08/2018    Procedure: ARTHROPLASTY, GLENOHUMERAL JOINT; TOTAL SHOULDER(GLENOID & PROXIMAL HUMERAL REPLACEMENT(EG, TOTAL SHOULDER);  Surgeon: Tomasa Rand, MD;  Location: [facility] OR [facility];  Service: Ortho Sports Medicine    PR REOPEN RECENT ABD EXPLORATORY Midline 03/01/2024    Procedure: REOPENING OF RECENT LAPAROTOMY;  Surgeon: [name]., MD;  Location: OR [facility];  Service: Trauma    PR REOPEN RECENT ABD EXPLORATORY N/A 03/03/2024    Procedure: REOPENING OF RECENT LAPAROTOMY;  Surgeon: Joanie Coddington, MD;  Location: OR [facility];  Service: Trauma    PR SIGMOIDOSCOPY,BIOPSY N/A 03/11/2015    Procedure: SIGMOIDOSCOPY, FLEXIBLE; WITH BIOPSY, SINGLE OR MULTIPLE;  Surgeon: Wilburt Finlay, MD;  [facility];  Service: Gastroenterology    PR XCAPSL CTRC RMVL INSJ IO LENS PROSTH W/O ECP Left 06/05/2022    Procedure: EXTRACAPSULAR CATARACT REMOVAL W/INSERTION OF INTRAOCULAR LENS PROSTHESIS, MANUAL OR MECHANICAL TECHNIQUE WITHOUT ENDOSCOPIC CYCLOPHOTOCOAGULATION;  Surgeon: Garner Gavel, MD;  Location: [facility] OR [facility];  Service: Ophthalmology    PR XCAPSL CTRC RMVL INSJ IO LENS PROSTH W/O ECP Right 06/19/2022    Procedure: EXTRACAPSULAR CATARACT REMOVAL W/INSERTION OF INTRAOCULAR LENS PROSTHESIS, MANUAL OR MECHANICAL TECHNIQUE WITHOUT ENDOSCOPIC CYCLOPHOTOCOAGULATION;  Surgeon: Garner Gavel, MD;  Location: [facility] OR [facility];  Service: Ophthalmology     Scheduled Medications:   [START ON 03/10/2024] amiodarone  150 mg Intravenous daily    [Provider Hold] aspirin  81 mg Enteral tube: gastric Daily    [Provider Hold] atorvastatin  40 mg Enteral tube: gastric Daily    budesonide (PULMICORT) nebulizer solution  0.5 mg Nebulization BID (RT)    fentaNYL (PF)  50 mcg Intravenous Once    flu vacc ts2024-25(51yr up)-PF  0.5 mL Intramuscular During hospitalization    [START ON 03/14/2024] fluPHENAZine decanoate  50 mg Intramuscular Q30 Days    insulin NPH  2 Units Subcutaneous Q12H SCH    insulin regular  0-20 Units Subcutaneous Q6H SCH    iohexol  50 mL Oral Once    ipratropium  500 mcg Nebulization Q6H (RT)    meropenem  500 mg Intravenous Q8H    pantoprazole (Protonix) intravenous solution  40 mg Intravenous Daily    [Provider Hold] polyethylene glycol  17 g Oral Daily    [Provider Hold] sennosides  2.5 mL Oral Nightly    sodium chloride  10 mL Intravenous Q8H    sodium chloride  10 mL Intravenous Q8H    sodium chloride  10 mL Intravenous Q8H     Continuous Infusions:   amiodarone 0.5 mg/min (03/08/24 0407)    heparin 13 Units/kg/hr (03/08/24 0130)    NORepinephrine bitartrate-NS Stopped (03/08/24 0610)    NxStage RFP 400 (+/- BB) 5000 mL - contains 2 mEq/L of potassium      NxStage/multiBic RFP 401 (+/- BB) 5000 mL - contains 4 mEq/L of potassium      Parenteral Nutrition (CENTRAL) 40 mL/hr at 03/07/24 2149     PRN Medications:  albuterol, [Provider Hold] cyclobenzaprine, dextrose in water, heparin (porcine), heparin (porcine), HYDROmorphone **OR** HYDROmorphone, nicotine polacrilex **OR** nicotine polacrilex, ondansetron

## 2024-03-08 NOTE — Unmapped (Signed)
 Neuro: Drowsy. A&O x3 when asked yes or no questions. Follows commands.  Resp: 7 ETT, 20 at the gums on vent. Oral and inline suctioning provided.   Cardiac: Afib on monitor. Amio and heparin gtts running at therapeutic rate. CRRT running with UF at 150. Levo titrated to maintain MAP >65. A-line in place.  GI/GU: NGT at 58cm and on LIWS for most of shift. TF started per order. Glucose checked. Urostomy in place and connected to foley. 1 BM this shift.  Skin: Midline incision. JP x2. Ostomy on RLQ. Wound care provided and dressings changed.  Access: CVAD in L neck. HD cath in R neck. PIV x2.      All precautions followed. Q2 turns provided. ICU status maintained.    Problem: Adult Inpatient Plan of Care  Goal: Absence of Hospital-Acquired Illness or Injury  Intervention: Identify and Manage Fall Risk  Recent Flowsheet Documentation  Taken 03/08/2024 0800 by Murriel Hopper, RN  Safety Interventions:   aspiration precautions   bed alarm   commode/urinal/bedpan at bedside   fall reduction program maintained   infection management   lighting adjusted for tasks/safety   low bed  Intervention: Prevent Skin Injury  Recent Flowsheet Documentation  Taken 03/08/2024 1600 by Murriel Hopper, RN  Positioning for Skin: Right  Taken 03/08/2024 1400 by Murriel Hopper, RN  Positioning for Skin: Left  Taken 03/08/2024 1200 by Murriel Hopper, RN  Positioning for Skin: Right  Taken 03/08/2024 1000 by Murriel Hopper, RN  Positioning for Skin: Left  Taken 03/08/2024 0800 by Murriel Hopper, RN  Positioning for Skin: Right  Device Skin Pressure Protection:   positioning supports utilized   pressure points protected  Skin Protection:   adhesive use limited   incontinence pads utilized  Intervention: Prevent and Manage VTE (Venous Thromboembolism) Risk  Recent Flowsheet Documentation  Taken 03/08/2024 1600 by Murriel Hopper, RN  Anti-Embolism Device Type: SCD, Knee  Anti-Embolism Device Status: On  Anti-Embolism Device Location: BLE  Taken 03/08/2024 1400 by Murriel Hopper, RN  Anti-Embolism Device Type: SCD, Knee  Anti-Embolism Device Status: On  Anti-Embolism Device Location: BLE  Taken 03/08/2024 1200 by Murriel Hopper, RN  Anti-Embolism Device Type: SCD, Knee  Anti-Embolism Device Status: On  Anti-Embolism Device Location: BLE  Taken 03/08/2024 1000 by Murriel Hopper, RN  Anti-Embolism Device Type: SCD, Knee  Anti-Embolism Device Status: On  Anti-Embolism Device Location: BLE  Taken 03/08/2024 0800 by Murriel Hopper, RN  Anti-Embolism Device Type: SCD, Knee  Anti-Embolism Device Status: On  Anti-Embolism Device Location: BLE  Intervention: Prevent Infection  Recent Flowsheet Documentation  Taken 03/08/2024 0800 by Murriel Hopper, RN  Infection Prevention: hand hygiene promoted     Problem: Infection  Goal: Absence of Infection Signs and Symptoms  Intervention: Prevent or Manage Infection  Recent Flowsheet Documentation  Taken 03/08/2024 0800 by Murriel Hopper, RN  Infection Management: aseptic technique maintained     Problem: Fall Injury Risk  Goal: Absence of Fall and Fall-Related Injury  Intervention: Promote Injury-Free Environment  Recent Flowsheet Documentation  Taken 03/08/2024 0800 by Murriel Hopper, RN  Safety Interventions:   aspiration precautions   bed alarm   commode/urinal/bedpan at bedside   fall reduction program maintained   infection management   lighting adjusted for tasks/safety   low bed     Problem: Wound  Goal: Absence of Infection Signs and Symptoms  Intervention: Prevent or Manage Infection  Recent Flowsheet Documentation  Taken 03/08/2024 0800 by Murriel Hopper, RN  Infection Management: aseptic technique  maintained  Goal: Skin Health and Integrity  Intervention: Optimize Skin Protection  Recent Flowsheet Documentation  Taken 03/08/2024 1600 by Murriel Hopper, RN  Head of Bed [facility]) Positioning: HOB at 30-45 degrees  Taken 03/08/2024 1400 by Murriel Hopper, RN  Head of Bed [facility]) Positioning: HOB at 30-45 degrees  Taken 03/08/2024 1200 by Murriel Hopper, RN  Head of Bed [facility]) Positioning: HOB at 30-45 degrees  Taken 03/08/2024 1000 by Murriel Hopper, RN  Head of Bed [facility]) Positioning: HOB at 30-45 degrees  Taken 03/08/2024 0800 by Murriel Hopper, RN  Pressure Reduction Techniques:   frequent weight shift encouraged   weight shift assistance provided  Head of Bed (HOB) Positioning: HOB at 30-45 degrees  Pressure Reduction Devices:   pressure-redistributing mattress utilized   positioning supports utilized  Skin Protection:   adhesive use limited   incontinence pads utilized     Problem: Skin Injury Risk Increased  Goal: Skin Health and Integrity  Intervention: Optimize Skin Protection  Recent Flowsheet Documentation  Taken 03/08/2024 1600 by Murriel Hopper, RN  Head of Bed [facility]) Positioning: HOB at 30-45 degrees  Taken 03/08/2024 1400 by Murriel Hopper, RN  Head of Bed [facility]) Positioning: HOB at 30-45 degrees  Taken 03/08/2024 1200 by Murriel Hopper, RN  Head of Bed [facility]) Positioning: HOB at 30-45 degrees  Taken 03/08/2024 1000 by Murriel Hopper, RN  Head of Bed [facility]) Positioning: HOB at 30-45 degrees  Taken 03/08/2024 0800 by Murriel Hopper, RN  Pressure Reduction Techniques:   frequent weight shift encouraged   weight shift assistance provided  Head of Bed (HOB) Positioning: HOB at 30-45 degrees  Pressure Reduction Devices:   pressure-redistributing mattress utilized   positioning supports utilized  Skin Protection:   adhesive use limited   incontinence pads utilized     Problem: Mechanical Ventilation Invasive  Goal: Optimal Device Function  Intervention: Optimize Device Care and Function  Recent Flowsheet Documentation  Taken 03/08/2024 1600 by Murriel Hopper, RN  Oral Care:   lip/mouth moisturizer applied   mouth swabbed   oral rinse provided   suction provided   teeth brushed   tongue brushed  Taken 03/08/2024 1200 by Murriel Hopper, RN  Oral Care:   lip/mouth moisturizer applied   mouth swabbed oral rinse provided   suction provided   teeth brushed   tongue brushed  Taken 03/08/2024 0800 by Murriel Hopper, RN  Oral Care: suction provided  Goal: Absence of Device-Related Skin and Tissue Injury  Intervention: Maintain Skin and Tissue Health  Recent Flowsheet Documentation  Taken 03/08/2024 0800 by Murriel Hopper, RN  Device Skin Pressure Protection:   positioning supports utilized   pressure points protected  Goal: Absence of Ventilator-Induced Lung Injury  Intervention: Prevent Ventilator-Associated Pneumonia  Recent Flowsheet Documentation  Taken 03/08/2024 1600 by Murriel Hopper, RN  Head of Bed [facility]) Positioning: HOB at 30-45 degrees  Oral Care:   lip/mouth moisturizer applied   mouth swabbed   oral rinse provided   suction provided   teeth brushed   tongue brushed  Taken 03/08/2024 1400 by Murriel Hopper, RN  Head of Bed [facility]) Positioning: HOB at 30-45 degrees  Taken 03/08/2024 1200 by Murriel Hopper, RN  Head of Bed [facility]) Positioning: HOB at 30-45 degrees  Oral Care:   lip/mouth moisturizer applied   mouth swabbed   oral rinse provided   suction provided   teeth brushed   tongue brushed  Taken 03/08/2024 1000 by Murriel Hopper, RN  Head of Bed [facility]) Positioning:  HOB at 30-45 degrees  Taken 03/08/2024 0800 by Murriel Hopper, RN  Head of Bed [facility]) Positioning: HOB at 30-45 degrees  Oral Care: suction provided     Problem: Artificial Airway  Goal: Optimal Device Function  Intervention: Optimize Device Care and Function  Recent Flowsheet Documentation  Taken 03/08/2024 1600 by Murriel Hopper, RN  Oral Care:   lip/mouth moisturizer applied   mouth swabbed   oral rinse provided   suction provided   teeth brushed   tongue brushed  Taken 03/08/2024 1200 by Murriel Hopper, RN  Oral Care:   lip/mouth moisturizer applied   mouth swabbed   oral rinse provided   suction provided   teeth brushed   tongue brushed  Taken 03/08/2024 0800 by Murriel Hopper, RN  Aspiration Precautions: NPO pending swallow screening/evaluation  Oral Care: suction provided  Goal: Absence of Device-Related Skin or Tissue Injury  Intervention: Maintain Skin and Tissue Health  Recent Flowsheet Documentation  Taken 03/08/2024 0800 by Murriel Hopper, RN  Device Skin Pressure Protection:   positioning supports utilized   pressure points protected

## 2024-03-08 NOTE — Unmapped (Signed)
 [facility] Nephrology Continuous Renal Replacement Therapy Procedure Note     Assessment and Plan:  #Acute Kidney Injury on CRRT: Due to ATN.   - Access: Right IJ non-tunneled catheter . Adequate function, no intervention needed.  - Ultrafiltration goal: 100  - Anticoagulation: None  - Current plan is to continue CRRT.  - Obtain BMP, Mg, Phos Q8h. Goal is for K, Mg, Phos within normal range.  - Dose all medications for CRRT at ordered therapy fluid rate  - This procedure was fully reviewed with the patient and/or their decision-maker. The risks, benefits, and alternatives were discussed prior to the procedure. All questions were answered and written informed consent was obtained.    Ernestina Columbia, MD  03/08/2024 9:16 AM    Subjective/Interval Events: Pt was seen and examined on CRRT. Spoke with daughter at length at bedside. Patient had just had cares and BP had increased slightly, still on NE 2.     Physical Exam:   Vitals:    03/08/24 0500 03/08/24 0600 03/08/24 0700 03/08/24 0816   BP:       Pulse: 112 109 109 107   Resp: 21 18 18 17    Temp:       TempSrc:       SpO2:  100%  100%   Weight:       Height:    154.9 cm (5' 0.98)     No intake/output data recorded.    Intake/Output Summary (Last 24 hours) at 03/08/2024 0916  Last data filed at 03/08/2024 0700  Gross per 24 hour   Intake 1297.81 ml   Output 1879 ml   Net -581.19 ml     General: Appearing ill  Pulmonary: normal and intubatd  Cardiovascular: regular rate and rhythm  Extremities: no significant edema     Lab Data:  Recent Labs     Units 03/03/24  2149 03/04/24  0340 03/07/24  1227 03/07/24  1935 03/07/24  2143 03/08/24  0357   NA mmol/L 147*   < > 142 143  --  140   K mmol/L 4.2   < > 3.3* 3.9  --  3.4   CL mmol/L 109*   < > 105 105  --  104   CO2 mmol/L 20.0   < > 18.0* 20.0  --  21.0   BUN mg/dL 80*   < > 83* 66*  --  62*   CREATININE mg/dL 6.96*   < > 2.95* 2.84*  --  2.23*   CALCIUM mg/dL 8.8   < > 8.5* 8.6*  --  8.2*   ALBUMIN g/dL 2.4*  --   --   --   --  2.0* PHOS mg/dL  --    < > 4.4  --  4.2 3.5    < > = values in this interval not displayed.     Recent Labs     Units 03/07/24  0414 03/07/24  0941 03/08/24  0357   WBC 10*9/L 19.3* 21.9* 23.9*   HGB g/dL 8.5* 8.2* 8.3*   HCT % 25.9* 25.5* 24.7*   PLT 10*9/L 244 242 164     Recent Labs     Units 03/04/24  1529 03/04/24  2318 03/07/24  0414 03/07/24  1227 03/08/24  0357   INR  1.06  --   --   --   --    APTT sec 29.3   < > 40.2* 62.6* 52.7*    < > =  values in this interval not displayed.

## 2024-03-08 NOTE — Unmapped (Signed)
 Patient remained on PSV for duration of shift. ABG WNL. ET tube patent and secure. All emergency supplies at bedside. Will continue to monitor.   Problem: Mechanical Ventilation Invasive  Goal: Effective Communication  Outcome: Ongoing - Unchanged  Goal: Optimal Device Function  Outcome: Ongoing - Unchanged  Intervention: Optimize Device Care and Function  Recent Flowsheet Documentation  Taken 03/08/2024 0816 by Augustin Schooling, RRT  Oral Care:   mouth swabbed   suction provided   teeth brushed   tongue brushed  Goal: Mechanical Ventilation Liberation  Outcome: Progressing  Goal: Absence of Device-Related Skin and Tissue Injury  Outcome: Ongoing - Unchanged  Goal: Absence of Ventilator-Induced Lung Injury  Outcome: Ongoing - Unchanged  Intervention: Prevent Ventilator-Associated Pneumonia  Recent Flowsheet Documentation  Taken 03/08/2024 0816 by Augustin Schooling, RRT  Oral Care:   mouth swabbed   suction provided   teeth brushed   tongue brushed

## 2024-03-08 NOTE — Unmapped (Signed)
 Continuous Renal Replacement  Dialysis Nurse Therapy Procedure Note    Treatment Type:  [facility] Number Of Days On Therapy:   Procedure Date:  04-06-2024 12:40 AM     TREATMENT STATUS:  Restarted   None            Active Dialysis Orders (168h ago, onward)       Start     Ordered    03/06/24 1603  CRRT Orders - NxStage (Adult)  Continuous        Comments: MAP <60 UF 10 ml/hr  MAP 60-65 UF 50-100 ml/hr  MAP>65 UF 100-150 ml/hr   Question Answer Comment   CRRT System: NxStage    Modality: CVVH    Access: Other    BFR (mL/min): 200-350    Dialysate Flow Rate (mL/kg/hr): 25 mL/kg/hr 1.7 L/hr   Fluid Removal Initial Rate (mL/hr): 10        03/06/24 1604                  SYSTEM CHECK:  Machine Name: [id number]  Dialyzer: [id number]   Self Test Completed: Yes        Alarms Connected To The Wall And Active:  No.    VITAL SIGNS:  Temp:  [35.9 ??C (96.6 ??F)-36.8 ??C (98.3 ??F)] 36 ??C (96.8 ??F)  Heart Rate:  [84-124] 109  SpO2 Pulse:  [41-124] 66  Resp:  [16-29] 18  SpO2:  [97 %-100 %] 100 %  BP: (121)/(78) 121/78  MAP (mmHg):  [83-91] 91  A BP-1: (60-159)/(41-94) 122/64  MAP:  [46 mmHg-117 mmHg] 85 mmHg    ACCESS SITE:        Hemodialysis Catheter With Distal Infusion Port 03/06/24 Right Internal jugular 1.2 mL 1.2 mL (Active)   Site Assessment Clean;Dry;Intact April 06, 2024 0400   Proximal Lumen Status / Patency Capped April 06, 2024 0400   Proximal Lumen Intervention Capped - dead end 04-06-24 0400   Medial Lumen Status / Patency Capped 2024-04-06 0400   Medial Lumen Intervention Capped - dead end 04-06-24 0400   Lumen 3, Distal Status / Patency Infusing 04-06-2024 0400   Distal Lumen Intervention Accessed April 06, 2024 0400   Exposed Catheter Length (cm) 0 cm 03/07/24 0030   IV Tubing and Needleless Injector Cap Change Due 03/13/24 04-06-24 0400   Dressing Type CHG gel;Occlusive;Transparent April 06, 2024 0400   Dressing Status      Clean;Dry;Intact/not removed 04/06/2024 0400   Dressing Intervention No intervention needed Apr 06, 2024 0400   Contraindicated due to: Dressing Intact surrounding insertion site 04/06/24 0400   Dressing Change Due 03/13/24 03/07/24 2000   Line Necessity Reviewed? Y 03/07/24 2000   Line Necessity Indications Yes - Hemodialysis 03/07/24 2000   Line Necessity Reviewed With neph 03/07/24 2000          CATHETER FILL VOLUMES:     Arterial: NA  Venous: NA     Lab Results   Component Value Date    NA 140 2024/04/06    K 3.4 April 06, 2024    CL 104 04/06/24    CO2 21.0 April 06, 2024    BUN 62 (H) 2024/04/06     Lab Results   Component Value Date    GLUCOSE 152 10/05/2011    CALCIUM 8.2 (L) 06-Apr-2024    CAION 4.65 Apr 06, 2024    PHOS 3.5 Apr 06, 2024    MG 2.0 April 06, 2024        SETTINGS:  Blood Pump Rate: 300 mL/min  Replacement Fluid Rate:     Pre-Blood Pump Fluid  Rate:    Hourly Fluid Removal Rate: 150 mL/hr   Dialysate Fluid Rate    Therapy Fluid Temperature:       ANTICOAGULANT:  None    ADDITIONAL COMMENTS:  None    HEMODIALYSIS ON-CALL NURSE PAGER NUMBER:  Monday thru Saturday 0700 - 1730: Call the Dialysis Unit ext. [phone]   After 1730 and all day Sunday: Call the Dialysis RN Pager Number [phone]     PROCEDURE REVIEW, VERIFICATION, HANDOFF:  CRRT settings verified, procedure reviewed, and instructions given to primary RN.     Primary CRRT RN Verifying: Lelon Huh, RN Dialysis RN Verifying: L.Kenaz Olafson,RN/Dialysis

## 2024-03-08 NOTE — Unmapped (Signed)
 [facility] Nephrology Continuous Renal Replacement Therapy Procedure Note     Assessment and Plan:  #Acute Kidney Injury on CRRT: Due to ATN.   - Access: Right IJ non-tunneled catheter . Adequate function, no intervention needed.  - Ultrafiltration goal: 150/h  - Anticoagulation: Systemic UFH  - Current plan is to continue CRRT.  - Obtain BMP, Mg, Phos Q8h. Goal is for K, Mg, Phos within normal range.  - Dose all medications for CRRT at ordered therapy fluid rate  - This procedure was fully reviewed with the patient and/or their decision-maker. The risks, benefits, and alternatives were discussed prior to the procedure. All questions were answered and written informed consent was obtained.    Elmer Picker, MD  03/08/2024 10:35 PM    Subjective/Interval Events: Pt was seen and examined on CRRT. Remains intubated. Ffamily at bedside.     Physical Exam:   Vitals:    03/08/24 1845 03/08/24 2000 03/08/24 2010 03/08/24 2026   BP:       Pulse: 106  105 107   Resp:       Temp:  36.2 ??C (97.1 ??F)     TempSrc:  Axillary     SpO2: 100%  99%    Weight:       Height:   154.9 cm (5' 0.98)      No intake/output data recorded.    Intake/Output Summary (Last 24 hours) at 03/08/2024 2235  Last data filed at 03/08/2024 1900  Gross per 24 hour   Intake 1827.22 ml   Output 2801 ml   Net -973.78 ml     General: Appearing ill  Pulmonary: mech vented  Cardiovascular: reg rate  Extremities: no significant edema     Lab Data:  Recent Labs     Units 03/03/24  2149 03/04/24  0340 03/07/24  2143 03/08/24  0357 03/08/24  0909 03/08/24  1200 03/08/24  2038   NA mmol/L 147*   < >  --  140 139 139 137   K mmol/L 4.2   < >  --  3.4 3.5 3.6 3.8   CL mmol/L 109*   < >  --  104  --  105 98   CO2 mmol/L 20.0   < >  --  21.0  --  22.0 26.0   BUN mg/dL 80*   < >  --  62*  --  50* 44*   CREATININE mg/dL 1.61*   < >  --  0.96*  --  1.67* 1.41*   CALCIUM mg/dL 8.8   < >  --  8.2*  --  7.8* 8.6*   ALBUMIN g/dL 2.4*  --   --  2.0*  --   --   --    PHOS mg/dL  --    < > 4.2 3.5  --  2.5  --     < > = values in this interval not displayed.     Recent Labs     Units 03/07/24  0414 03/07/24  0941 03/08/24  0357   WBC 10*9/L 19.3* 21.9* 23.9*   HGB g/dL 8.5* 8.2* 8.3*   HCT % 25.9* 25.5* 24.7*   PLT 10*9/L 244 242 164     Recent Labs     Units 03/04/24  1529 03/04/24  2318 03/07/24  0414 03/07/24  1227 03/08/24  0357   INR  1.06  --   --   --   --    APTT sec 29.3   < >  40.2* 62.6* 52.7*    < > = values in this interval not displayed.

## 2024-03-08 NOTE — Unmapped (Signed)
 Urology Treatment Plan:    Paged about foley falling out of urostomy. Foley was recommended to remain until Cr normalizes.     Recommendations:  - Replace catheter into stoma until Cr normalizes.   - trend I/O's.

## 2024-03-08 NOTE — Unmapped (Signed)
 Shift summary: Pt remains on PRVC 40%.  Drowsy, following simple commands and nods head to questions.  Afib w/PVCs.  Remains on hep and amio gtt.  Titrating levo for MAP >65.  CRRT down for about 5 hrs this shift.  HD nurse restarted and needed to switch dialysis machines.  UF at 129ml/hr.  BMx2.  Minimal output from NG to LIWS.  Started TPN - increase in blood sugar this am.  Family at bedside.    Problem: Adult Inpatient Plan of Care  Goal: Plan of Care Review  Outcome: Ongoing - Unchanged  Goal: Patient-Specific Goal (Individualized)  Outcome: Ongoing - Unchanged  Goal: Absence of Hospital-Acquired Illness or Injury  Outcome: Ongoing - Unchanged  Intervention: Identify and Manage Fall Risk  Recent Flowsheet Documentation  Taken 03/08/2024 0600 by Brayton Mars, RN  Safety Interventions: family at bedside  Taken 03/08/2024 0400 by Brayton Mars, RN  Safety Interventions: low bed  Taken 03/08/2024 0159 by Brayton Mars, RN  Safety Interventions: low bed  Taken 03/08/2024 0000 by Brayton Mars, RN  Safety Interventions: family at bedside  Taken 03/07/2024 2200 by Brayton Mars, RN  Safety Interventions: family at bedside  Taken 03/07/2024 2000 by Brayton Mars, RN  Safety Interventions:   aspiration precautions   bed alarm   fall reduction program maintained  Intervention: Prevent Skin Injury  Recent Flowsheet Documentation  Taken 03/08/2024 0600 by Brayton Mars, RN  Positioning for Skin: Left  Taken 03/08/2024 0400 by Brayton Mars, RN  Positioning for Skin: Right  Taken 03/08/2024 0159 by Brayton Mars, RN  Positioning for Skin: Left  Taken 03/08/2024 0000 by Brayton Mars, RN  Positioning for Skin: Right  Taken 03/07/2024 2200 by Brayton Mars, RN  Positioning for Skin: Left  Taken 03/07/2024 2000 by Brayton Mars, RN  Positioning for Skin: Right  Device Skin Pressure Protection:   absorbent pad utilized/changed pressure points protected  Skin Protection: adhesive use limited  Intervention: Prevent and Manage VTE (Venous Thromboembolism) Risk  Recent Flowsheet Documentation  Taken 03/08/2024 0600 by Brayton Mars, RN  Anti-Embolism Device Type: SCD, Knee  Anti-Embolism Device Status: On  Anti-Embolism Device Location: BLE  Taken 03/08/2024 0400 by Brayton Mars, RN  Anti-Embolism Device Type: SCD, Knee  Anti-Embolism Device Status: On  Anti-Embolism Device Location: BLE  Taken 03/08/2024 0000 by Brayton Mars, RN  Anti-Embolism Device Type: SCD, Knee  Anti-Embolism Device Status: On  Anti-Embolism Device Location: BLE  Intervention: Prevent Infection  Recent Flowsheet Documentation  Taken 03/07/2024 2000 by Brayton Mars, RN  Infection Prevention:   cohorting utilized   hand hygiene promoted  Goal: Optimal Comfort and Wellbeing  Outcome: Ongoing - Unchanged  Goal: Readiness for Transition of Care  Outcome: Ongoing - Unchanged  Goal: Rounds/Family Conference  Outcome: Ongoing - Unchanged     Problem: Infection  Goal: Absence of Infection Signs and Symptoms  Outcome: Ongoing - Unchanged  Intervention: Prevent or Manage Infection  Recent Flowsheet Documentation  Taken 03/07/2024 2000 by Brayton Mars, RN  Infection Management: aseptic technique maintained     Problem: Wound  Goal: Optimal Coping  Outcome: Ongoing - Unchanged  Goal: Optimal Functional Ability  Outcome: Ongoing - Unchanged  Intervention: Optimize Functional Ability  Recent Flowsheet Documentation  Taken 03/08/2024 0400 by Brayton Mars, RN  Activity Management: bedrest  Taken 03/08/2024 0159 by Brayton Mars, RN  Activity Management: bedrest  Taken 03/08/2024  0000 by Brayton Mars, RN  Activity Management: bedrest  Taken 03/07/2024 2000 by Brayton Mars, RN  Activity Management: bedrest  Goal: Absence of Infection Signs and Symptoms  Outcome: Ongoing - Unchanged  Intervention: Prevent or Manage Infection  Recent Flowsheet Documentation  Taken 03/07/2024 2000 by Brayton Mars, RN  Infection Management: aseptic technique maintained  Goal: Improved Oral Intake  Outcome: Ongoing - Unchanged  Goal: Optimal Pain Control and Function  Outcome: Ongoing - Unchanged  Goal: Skin Health and Integrity  Outcome: Ongoing - Unchanged  Intervention: Optimize Skin Protection  Recent Flowsheet Documentation  Taken 03/08/2024 0600 by Brayton Mars, RN  Head of Bed [facility]) Positioning: HOB at 30-45 degrees  Taken 03/08/2024 0400 by Brayton Mars, RN  Activity Management: bedrest  Head of Bed [facility]) Positioning: HOB at 30-45 degrees  Taken 03/08/2024 0159 by Brayton Mars, RN  Activity Management: bedrest  Taken 03/08/2024 0000 by Brayton Mars, RN  Activity Management: bedrest  Taken 03/07/2024 2000 by Brayton Mars, RN  Activity Management: bedrest  Pressure Reduction Techniques: weight shift assistance provided  Head of Bed (HOB) Positioning: HOB at 30-45 degrees  Pressure Reduction Devices:   alternating pressure pump (APP)   foam padding utilized   heel offloading device utilized   positioning supports utilized   pressure-redistributing mattress utilized  Skin Protection: adhesive use limited  Goal: Optimal Wound Healing  Outcome: Ongoing - Unchanged     Problem: Skin Injury Risk Increased  Goal: Skin Health and Integrity  Outcome: Ongoing - Unchanged  Intervention: Optimize Skin Protection  Recent Flowsheet Documentation  Taken 03/08/2024 0600 by Brayton Mars, RN  Head of Bed [facility]) Positioning: HOB at 30-45 degrees  Taken 03/08/2024 0400 by Brayton Mars, RN  Activity Management: bedrest  Head of Bed [facility]) Positioning: HOB at 30-45 degrees  Taken 03/08/2024 0159 by Brayton Mars, RN  Activity Management: bedrest  Taken 03/08/2024 0000 by Brayton Mars, RN  Activity Management: bedrest  Taken 03/07/2024 2000 by Brayton Mars, RN  Activity Management: bedrest  Pressure Reduction Techniques: weight shift assistance provided  Head of Bed (HOB) Positioning: HOB at 30-45 degrees  Pressure Reduction Devices:   alternating pressure pump (APP)   foam padding utilized   heel offloading device utilized   positioning supports utilized   pressure-redistributing mattress utilized  Skin Protection: adhesive use limited

## 2024-03-09 LAB — IONIZED CALCIUM VENOUS
CALCIUM IONIZED VENOUS (MG/DL): 4.8 mg/dL (ref 4.40–5.40)
CALCIUM IONIZED VENOUS (MG/DL): 4.39 mg/dL — ABNORMAL LOW (ref 4.40–5.40)
CALCIUM IONIZED VENOUS (MG/DL): 4.75 mg/dL (ref 4.40–5.40)

## 2024-03-09 LAB — BASIC METABOLIC PANEL
ANION GAP: 12 mmol/L (ref 5–14)
ANION GAP: 9 mmol/L (ref 5–14)
ANION GAP: 14 mmol/L (ref 5–14)
BLOOD UREA NITROGEN: 41 mg/dL — ABNORMAL HIGH (ref 9–23)
BLOOD UREA NITROGEN: 35 mg/dL — ABNORMAL HIGH (ref 9–23)
BLOOD UREA NITROGEN: 32 mg/dL — ABNORMAL HIGH (ref 9–23)
BUN / CREAT RATIO: 34
BUN / CREAT RATIO: 32
BUN / CREAT RATIO: 33
CALCIUM: 8.3 mg/dL — ABNORMAL LOW (ref 8.7–10.4)
CALCIUM: 8.6 mg/dL — ABNORMAL LOW (ref 8.7–10.4)
CALCIUM: 8.4 mg/dL — ABNORMAL LOW (ref 8.7–10.4)
CHLORIDE: 100 mmol/L (ref 98–107)
CHLORIDE: 100 mmol/L (ref 98–107)
CHLORIDE: 99 mmol/L (ref 98–107)
CO2: 27 mmol/L (ref 20.0–31.0)
CO2: 27 mmol/L (ref 20.0–31.0)
CO2: 27 mmol/L (ref 20.0–31.0)
CREATININE: 1.21 mg/dL — ABNORMAL HIGH (ref 0.55–1.02)
CREATININE: 1.1 mg/dL — ABNORMAL HIGH (ref 0.55–1.02)
CREATININE: 0.97 mg/dL (ref 0.55–1.02)
EGFR CKD-EPI (2021) FEMALE: 45 mL/min/{1.73_m2} — ABNORMAL LOW (ref >=60)
EGFR CKD-EPI (2021) FEMALE: 51 mL/min/{1.73_m2} — ABNORMAL LOW (ref >=60)
EGFR CKD-EPI (2021) FEMALE: 59 mL/min/{1.73_m2} — ABNORMAL LOW (ref >=60)
GLUCOSE RANDOM: 249 mg/dL — ABNORMAL HIGH (ref 70–179)
GLUCOSE RANDOM: 242 mg/dL — ABNORMAL HIGH (ref 70–179)
GLUCOSE RANDOM: 233 mg/dL — ABNORMAL HIGH (ref 70–179)
POTASSIUM: 3.9 mmol/L (ref 3.5–5.1)
POTASSIUM: 3.5 mmol/L (ref 3.4–4.8)
POTASSIUM: 3.5 mmol/L (ref 3.4–4.8)
SODIUM: 139 mmol/L (ref 135–145)
SODIUM: 136 mmol/L (ref 135–145)
SODIUM: 140 mmol/L (ref 135–145)

## 2024-03-09 LAB — BLOOD GAS CRITICAL CARE PANEL, ARTERIAL
BASE EXCESS ARTERIAL: 2.5 — ABNORMAL HIGH (ref -2.0–2.0)
CALCIUM IONIZED ARTERIAL (MG/DL): 4.58 mg/dL (ref 4.40–5.40)
GLUCOSE WHOLE BLOOD: 247 mg/dL — ABNORMAL HIGH (ref 70–179)
HCO3 ARTERIAL: 26 mmol/L (ref 22–27)
HEMOGLOBIN BLOOD GAS: 8.1 g/dL — ABNORMAL LOW (ref 12.00–16.00)
LACTATE BLOOD ARTERIAL: 1.2 mmol/L (ref <1.3)
O2 SATURATION ARTERIAL: 98.5 % (ref 94.0–100.0)
PCO2 ARTERIAL: 36.6 mmHg (ref 35.0–45.0)
PH ARTERIAL: 7.47 — ABNORMAL HIGH (ref 7.35–7.45)
PO2 ARTERIAL: 110 mmHg (ref 80.0–110.0)
POTASSIUM WHOLE BLOOD: 3.3 mmol/L — ABNORMAL LOW (ref 3.4–4.6)
SODIUM WHOLE BLOOD: 136 mmol/L (ref 135–145)

## 2024-03-09 LAB — CBC
HEMATOCRIT: 25.2 % — ABNORMAL LOW (ref 34.0–44.0)
HEMOGLOBIN: 8.4 g/dL — ABNORMAL LOW (ref 11.3–14.9)
MEAN CORPUSCULAR HEMOGLOBIN CONC: 33.1 g/dL (ref 32.0–36.0)
MEAN CORPUSCULAR HEMOGLOBIN: 27.8 pg (ref 25.9–32.4)
MEAN CORPUSCULAR VOLUME: 84 fL (ref 77.6–95.7)
MEAN PLATELET VOLUME: 9.1 fL (ref 6.8–10.7)
PLATELET COUNT: 161 10*9/L (ref 150–450)
RED BLOOD CELL COUNT: 3.01 10*12/L — ABNORMAL LOW (ref 3.95–5.13)
RED CELL DISTRIBUTION WIDTH: 17.6 % — ABNORMAL HIGH (ref 12.2–15.2)
WBC ADJUSTED: 25.6 10*9/L — ABNORMAL HIGH (ref 3.6–11.2)

## 2024-03-09 LAB — APTT
APTT: 44 s — ABNORMAL HIGH (ref 24.8–38.4)
HEPARIN CORRELATION: 0.3

## 2024-03-09 LAB — PHOSPHORUS
PHOSPHORUS: 1.6 mg/dL — ABNORMAL LOW (ref 2.4–5.1)
PHOSPHORUS: 1.3 mg/dL — ABNORMAL LOW (ref 2.4–5.1)

## 2024-03-09 LAB — MAGNESIUM
MAGNESIUM: 1.9 mg/dL (ref 1.6–2.6)
MAGNESIUM: 1.8 mg/dL (ref 1.6–2.6)
MAGNESIUM: 1.7 mg/dL (ref 1.6–2.6)

## 2024-03-09 MED ADMIN — NxStage/multiBic RFP 401 (+/- BB) 5000 mL - contains 4 mEq/L of potassium dialysis solution 5,000 mL: 5000 mL | INTRAVENOUS_CENTRAL | @ 05:00:00

## 2024-03-09 MED ADMIN — sodium chloride (NS) 0.9 % flush 10 mL: 10 mL | INTRAVENOUS | @ 03:00:00

## 2024-03-09 MED ADMIN — Parenteral Nutrition (CENTRAL): INTRAVENOUS | @ 02:00:00 | Stop: 2024-03-09

## 2024-03-09 MED ADMIN — ipratropium (ATROVENT) 0.02 % nebulizer solution 500 mcg: 500 ug | RESPIRATORY_TRACT | @ 20:00:00

## 2024-03-09 MED ADMIN — atorvastatin (LIPITOR) tablet 40 mg: 40 mg | GASTROENTERAL | @ 12:00:00

## 2024-03-09 MED ADMIN — sodium chloride (NS) 0.9 % flush 10 mL: 10 mL | INTRAVENOUS | @ 10:00:00

## 2024-03-09 MED ADMIN — sodium chloride (NS) 0.9 % flush 10 mL: 10 mL | INTRAVENOUS | @ 17:00:00

## 2024-03-09 MED ADMIN — amiodarone 360 mg in dextrose 5% 200 mL (1.8 mg/mL) infusion PMB: .5 mg/min | INTRAVENOUS | @ 07:00:00 | Stop: 2024-03-09

## 2024-03-09 MED ADMIN — aspirin chewable tablet 81 mg: 81 mg | GASTROENTERAL | @ 12:00:00

## 2024-03-09 MED ADMIN — heparin 25,000 Units/250 mL (100 units/mL) in 0.45% saline infusion (premade): 0-24 [IU]/kg/h | INTRAVENOUS | @ 06:00:00

## 2024-03-09 MED ADMIN — insulin NPH (HumuLIN,NovoLIN) injection 5 Units: 5 [IU] | SUBCUTANEOUS | @ 01:00:00

## 2024-03-09 MED ADMIN — acetaminophen (TYLENOL) tablet 650 mg: 650 mg | GASTROENTERAL | @ 17:00:00

## 2024-03-09 MED ADMIN — acetaminophen (TYLENOL) tablet 650 mg: 650 mg | GASTROENTERAL | @ 12:00:00

## 2024-03-09 MED ADMIN — ipratropium (ATROVENT) 0.02 % nebulizer solution 500 mcg: 500 ug | RESPIRATORY_TRACT

## 2024-03-09 MED ADMIN — budesonide (PULMICORT) nebulizer solution 0.5 mg: .5 mg | RESPIRATORY_TRACT | @ 13:00:00

## 2024-03-09 MED ADMIN — insulin regular (HumuLIN,NovoLIN) injection 0-20 Units: 0-20 [IU] | SUBCUTANEOUS | @ 17:00:00

## 2024-03-09 MED ADMIN — sodium phosphate 30 mmol in dextrose 5 % 250 mL IVPB: 30 mmol | INTRAVENOUS | @ 17:00:00 | Stop: 2024-03-09

## 2024-03-09 MED ADMIN — insulin NPH (HumuLIN,NovoLIN) injection 5 Units: 5 [IU] | SUBCUTANEOUS | @ 15:00:00 | Stop: 2024-03-09

## 2024-03-09 MED ADMIN — ipratropium (ATROVENT) 0.02 % nebulizer solution 500 mcg: 500 ug | RESPIRATORY_TRACT | @ 08:00:00

## 2024-03-09 MED ADMIN — pantoprazole (Protonix) injection 40 mg: 40 mg | INTRAVENOUS | @ 12:00:00 | Stop: 2024-03-09

## 2024-03-09 MED ADMIN — amiodarone 360 mg in dextrose 5% 200 mL (1.8 mg/mL) infusion PMB: .5 mg/min | INTRAVENOUS | @ 17:00:00 | Stop: 2024-03-09

## 2024-03-09 MED ADMIN — ipratropium (ATROVENT) 0.02 % nebulizer solution 500 mcg: 500 ug | RESPIRATORY_TRACT | @ 13:00:00

## 2024-03-09 MED ADMIN — meropenem (MERREM) 500 mg in sodium chloride 0.9 % (NS) 100 mL IVPB-MBP: 500 mg | INTRAVENOUS | @ 22:00:00 | Stop: 2024-03-12

## 2024-03-09 MED ADMIN — budesonide (PULMICORT) nebulizer solution 0.5 mg: .5 mg | RESPIRATORY_TRACT

## 2024-03-09 MED ADMIN — insulin regular (HumuLIN,NovoLIN) injection 0-20 Units: 0-20 [IU] | SUBCUTANEOUS | @ 22:00:00

## 2024-03-09 MED ADMIN — meropenem (MERREM) 500 mg in sodium chloride 0.9 % (NS) 100 mL IVPB-MBP: 500 mg | INTRAVENOUS | @ 05:00:00 | Stop: 2024-03-12

## 2024-03-09 MED ADMIN — insulin regular (HumuLIN,NovoLIN) injection 0-20 Units: 0-20 [IU] | SUBCUTANEOUS | @ 10:00:00

## 2024-03-09 MED ADMIN — meropenem (MERREM) 500 mg in sodium chloride 0.9 % (NS) 100 mL IVPB-MBP: 500 mg | INTRAVENOUS | @ 12:00:00 | Stop: 2024-03-12

## 2024-03-09 MED ADMIN — oxyCODONE (ROXICODONE) immediate release tablet 2.5 mg: 2.5 mg | GASTROENTERAL | @ 02:00:00 | Stop: 2024-03-22

## 2024-03-09 MED ADMIN — acetaminophen (OFIRMEV) 10 mg/mL injection 1,000 mg: 1000 mg | INTRAVENOUS | @ 04:00:00 | Stop: 2024-03-09

## 2024-03-09 NOTE — Unmapped (Signed)
 Problem: Adult Inpatient Plan of Care  Goal: Plan of Care Review  Outcome: Ongoing - Unchanged  Goal: Patient-Specific Goal (Individualized)  Outcome: Ongoing - Unchanged  Goal: Absence of Hospital-Acquired Illness or Injury  Outcome: Ongoing - Unchanged  Intervention: Identify and Manage Fall Risk  Recent Flowsheet Documentation  Taken 03/08/2024 2000 by Donnal Debar, RN  Safety Interventions:   aspiration precautions   bleeding precautions   environmental modification   infection management   fall reduction program maintained   low bed  Intervention: Prevent Skin Injury  Recent Flowsheet Documentation  Taken 03/09/2024 0600 by Donnal Debar, RN  Positioning for Skin: Left  Taken 03/09/2024 0400 by Donnal Debar, RN  Positioning for Skin: Right  Taken 03/09/2024 0200 by Donnal Debar, RN  Positioning for Skin: Left  Taken 03/09/2024 0000 by Donnal Debar, RN  Positioning for Skin: Right  Taken 03/08/2024 2200 by Donnal Debar, RN  Positioning for Skin: Left  Taken 03/08/2024 2000 by Donnal Debar, RN  Positioning for Skin: Right  Device Skin Pressure Protection: absorbent pad utilized/changed  Skin Protection:   incontinence pads utilized   protective footwear used   transparent dressing maintained  Intervention: Prevent and Manage VTE (Venous Thromboembolism) Risk  Recent Flowsheet Documentation  Taken 03/09/2024 0600 by Donnal Debar, RN  Anti-Embolism Device Type: SCD, Knee  Anti-Embolism Device Status: On  Anti-Embolism Device Location: BLE  Taken 03/09/2024 0400 by Donnal Debar, RN  Anti-Embolism Device Type: SCD, Knee  Anti-Embolism Device Status: On  Anti-Embolism Device Location: BLE  Taken 03/09/2024 0200 by Donnal Debar, RN  Anti-Embolism Device Type: SCD, Knee  Anti-Embolism Device Status: On  Anti-Embolism Device Location: BLE  Taken 03/09/2024 0000 by Donnal Debar, RN  Anti-Embolism Device Type: SCD, Knee  Anti-Embolism Device Status: On  Anti-Embolism Device Location: BLE  Taken 03/08/2024 2200 by Donnal Debar, RN  Anti-Embolism Device Type: SCD, Knee  Anti-Embolism Device Status: On  Anti-Embolism Device Location: BLE  Taken 03/08/2024 2000 by Donnal Debar, RN  Anti-Embolism Device Type: SCD, Knee  Anti-Embolism Device Status: On  Anti-Embolism Device Location: BLE  Intervention: Prevent Infection  Recent Flowsheet Documentation  Taken 03/08/2024 2000 by Donnal Debar, RN  Infection Prevention:   cohorting utilized   environmental surveillance performed   equipment surfaces disinfected   hand hygiene promoted   personal protective equipment utilized  Goal: Optimal Comfort and Wellbeing  Outcome: Ongoing - Unchanged  Goal: Readiness for Transition of Care  Outcome: Ongoing - Unchanged  Goal: Rounds/Family Conference  Outcome: Ongoing - Unchanged     Problem: Infection  Goal: Absence of Infection Signs and Symptoms  Outcome: Ongoing - Unchanged  Intervention: Prevent or Manage Infection  Recent Flowsheet Documentation  Taken 03/08/2024 2000 by Donnal Debar, RN  Infection Management: aseptic technique maintained     Problem: Fall Injury Risk  Goal: Absence of Fall and Fall-Related Injury  Outcome: Ongoing - Unchanged  Intervention: Promote Injury-Free Environment  Recent Flowsheet Documentation  Taken 03/08/2024 2000 by Donnal Debar, RN  Safety Interventions:   aspiration precautions   bleeding precautions   environmental modification   infection management   fall reduction program maintained   low bed     Problem: Wound  Goal: Optimal Coping  Outcome: Ongoing - Unchanged  Goal: Optimal Functional Ability  Outcome: Ongoing - Unchanged  Intervention: Optimize Functional Ability  Recent Flowsheet Documentation  Taken 03/09/2024 0000 by Janeice Robinson,  Prudencio Pair, RN  Activity Management: bedrest  Taken 03/08/2024 2000 by Donnal Debar, RN  Activity Management: bedrest  Goal: Absence of Infection Signs and Symptoms  Outcome: Ongoing - Unchanged  Intervention: Prevent or Manage Infection  Recent Flowsheet Documentation  Taken 03/08/2024 2000 by Donnal Debar, RN  Infection Management: aseptic technique maintained  Goal: Improved Oral Intake  Outcome: Ongoing - Unchanged  Goal: Optimal Pain Control and Function  Outcome: Ongoing - Unchanged  Goal: Skin Health and Integrity  Outcome: Ongoing - Unchanged  Intervention: Optimize Skin Protection  Recent Flowsheet Documentation  Taken 03/09/2024 0000 by Donnal Debar, RN  Activity Management: bedrest  Taken 03/08/2024 2000 by Donnal Debar, RN  Activity Management: bedrest  Pressure Reduction Techniques:   frequent weight shift encouraged   heels elevated off bed   positioned off wounds   pressure points protected  Head of Bed (HOB) Positioning: HOB at 30-45 degrees  Pressure Reduction Devices:   positioning supports utilized   heel offloading device utilized  Skin Protection:   incontinence pads utilized   protective footwear used   transparent dressing maintained  Goal: Optimal Wound Healing  Outcome: Ongoing - Unchanged     Problem: Skin Injury Risk Increased  Goal: Skin Health and Integrity  Outcome: Ongoing - Unchanged  Intervention: Optimize Skin Protection  Recent Flowsheet Documentation  Taken 03/09/2024 0000 by Donnal Debar, RN  Activity Management: bedrest  Taken 03/08/2024 2000 by Donnal Debar, RN  Activity Management: bedrest  Pressure Reduction Techniques:   frequent weight shift encouraged   heels elevated off bed   positioned off wounds   pressure points protected  Head of Bed (HOB) Positioning: HOB at 30-45 degrees  Pressure Reduction Devices:   positioning supports utilized   heel offloading device utilized  Skin Protection:   incontinence pads utilized   protective footwear used   transparent dressing maintained     Problem: Self-Care Deficit  Goal: Improved Ability to Complete Activities of Daily Living  Outcome: Ongoing - Unchanged     Problem: Mechanical Ventilation Invasive  Goal: Effective Communication  Outcome: Ongoing - Unchanged  Goal: Optimal Device Function  Outcome: Ongoing - Unchanged  Intervention: Optimize Device Care and Function  Recent Flowsheet Documentation  Taken 03/09/2024 0600 by Donnal Debar, RN  Oral Care: lip/mouth moisturizer applied  Taken 03/09/2024 0400 by Donnal Debar, RN  Oral Care:   lip/mouth moisturizer applied   mouth swabbed   oral rinse provided   suction provided  Taken 03/09/2024 0000 by Donnal Debar, RN  Oral Care:   oral rinse provided   mouth swabbed   suction provided   lip/mouth moisturizer applied  Taken 03/08/2024 2000 by Donnal Debar, RN  Oral Care:   mouth swabbed   oral rinse provided   suction provided  Goal: Mechanical Ventilation Liberation  Outcome: Ongoing - Unchanged  Goal: Optimal Nutrition Delivery  Outcome: Ongoing - Unchanged  Goal: Absence of Device-Related Skin and Tissue Injury  Outcome: Ongoing - Unchanged  Intervention: Maintain Skin and Tissue Health  Recent Flowsheet Documentation  Taken 03/08/2024 2000 by Donnal Debar, RN  Device Skin Pressure Protection: absorbent pad utilized/changed  Goal: Absence of Ventilator-Induced Lung Injury  Outcome: Ongoing - Unchanged  Intervention: Prevent Ventilator-Associated Pneumonia  Recent Flowsheet Documentation  Taken 03/09/2024 0600 by Donnal Debar, RN  Oral Care: lip/mouth moisturizer applied  Taken 03/09/2024 0400 by Donnal Debar, RN  Oral Care:  lip/mouth moisturizer applied   mouth swabbed   oral rinse provided   suction provided  Taken 03/09/2024 0000 by Donnal Debar, RN  Oral Care:   oral rinse provided   mouth swabbed   suction provided   lip/mouth moisturizer applied  Taken 03/08/2024 2000 by Donnal Debar, RN  Head of Bed [facility]) Positioning: HOB at 30-45 degrees  Oral Care:   mouth swabbed   oral rinse provided   suction provided     Problem: Artificial Airway  Goal: Effective Communication  Outcome: Ongoing - Unchanged  Goal: Optimal Device Function  Outcome: Ongoing - Unchanged  Intervention: Optimize Device Care and Function  Recent Flowsheet Documentation  Taken 03/09/2024 0600 by Donnal Debar, RN  Oral Care: lip/mouth moisturizer applied  Taken 03/09/2024 0400 by Donnal Debar, RN  Oral Care:   lip/mouth moisturizer applied   mouth swabbed   oral rinse provided   suction provided  Taken 03/09/2024 0000 by Donnal Debar, RN  Oral Care:   oral rinse provided   mouth swabbed   suction provided   lip/mouth moisturizer applied  Taken 03/08/2024 2000 by Donnal Debar, RN  Aspiration Precautions:   oral hygiene care promoted   respiratory status monitored  Oral Care:   mouth swabbed   oral rinse provided   suction provided  Goal: Absence of Device-Related Skin or Tissue Injury  Outcome: Ongoing - Unchanged  Intervention: Maintain Skin and Tissue Health  Recent Flowsheet Documentation  Taken 03/08/2024 2000 by Donnal Debar, RN  Device Skin Pressure Protection: absorbent pad utilized/changed

## 2024-03-09 NOTE — Unmapped (Signed)
 Pt remained on PSV 10/+8 throughout shift and tolerating well. Very anxious to get ETT out and asks about it every time I come in room.Airway patent and secure. Emergency supplies at bedside. RT will continue to monitor.

## 2024-03-09 NOTE — Unmapped (Signed)
 [facility] PROGRESS NOTE     Date of Service: 03/09/2024    Hospital Day: LOS: 11 days        Surgery Date: TBD   Surgical Attending: Merri Ray, MD    Critical Care Attending: Thalia Party, MD    Interval History:   NE at 3. Continues on CRRT @UF  goal of 150. Passed SBT this AM. Tolerating tube feeds at goal overnight. Tube feeds paused in anticipation of extubation.     History of Present Illness:   Tammy Braun is a 83 y.o. female with PMH bladder cancer with cystectomy and ileal conduit, COPD, recent dx of afib, moderate-severe TR, CAD (high calcification score), T2DM presented to [facility] with projectile vomiting and abdominal pain, found to have SBO with transition point at level of ileostomy on CT A/P. On initial presentation, labs remarkable for leukocytosis thought to be iso hemoconcentration, AKI on CKD, mildly elevated venous lactate improved with resuscitation. Admitted to [facility] for worsening hypoxia on HFNC requiring intubation in setting of hiatal hernia. OR on 02/28/24 for ex-lap, reduciton of parastomal hernia, small bowel resection with primary anastomosis, lysis of adhesions, abdomen left open with ABThera in place.     Hospital Course:  - 02/26/24: Admitted to [facility], floor status, consult to urology for possible ileal conduit revision.   - 02/27/24: Patient level of care escalated to step-down iso increased oxygen needs while in ED.  - 02/28/24: Upgraded to ICU. Intubated at bed-side. OR for ex-lap, reduction of parastomal hernia, small bowel resection with primary anastomosis, lysis of adhesions, abdomen left open with ABThera in place.   - 03/01/24: OR for attempt at abdominal closure. Left open due to desaturations during closure attempt. ABThera in place.  - 03/03/24: RTOR for abdominal closure.  -03/06/24: Vas cath placed, CRRT started      ASSESSMENT & PLAN:     Neurologic:  - Pain: Oxy PRN, tylenol SCH    #AMS, somnolence, improving   - Differentials include stroke (ischemic vs hemorrhagic - low suspicion), metabolic encephalopathy 2/2 drug induced, vs uremia, vs infection   - Off sedation since 3/4   - 3/7 Head CT without acute changes  - CRRT, although given clearing of mental status with continued elevated BUN - low suspicion of uremia induced encephalopathy    Cardiovascular:  #HTN, HLD:   - Chronic.    - MAP goal > 65    > NE ~3-3mcg/min   - Wean as able   - Ok for NE up to 61mcg/min to allow up to 100cc/hr UF on CRRT   - If NE >4, bring down UF  - Resume home ASA and Lipitor     #Afib/flutter w RVR:   - Prior hx of paroxysmal afib. CHADSvasc = 6, not anticoagulated PTA per chart review.   - Continue amio gtt, heparin gtt started iso PE   - Holding home diltiazem, question efficacy given likely poor absorption  - Amio load will complete today, transition to 200mg  PO thereafter   - Echo 3/1: LVEF > 55%. Severe TR.  - Pro-BNP: 6,988 on 3/3     #Pulmonary embolism  - 3/5 CTA chest: Filling defect in L anterior segmental artery  - heparin gtt     #Acute thrombus right basilic   - Identified 3/3 on BUE duplex  - Supportive care, superficial, AC not required     Respiratory:  #Acute on chronic hypoxemia  #Hx of COPD  - Mechanically ventilated  - Continue  pulmonary toilet  - Home incruse ellipta held  - Daily SBT  - Daily ABG  - CXR daily while intubated   - Passing SBT, following commands  - Family refused extubation on 3/9, plan for extubation 3/10 AM     #Pseudomonas Pneumonia   #VAP  - see ID for abx plan    FEN/GI:  F: Medlocked  E: Replete electrolytes PRN  N: NPO   - TF paused for extubation, resume when able    - let TPN expire  - GI prophylaxis: discontinue IV Protonix   - bowel regimen: none   Last BM: 3/9    #S/p ex-lap, reduction of parastomal hernia, small bowel resection with primary anastomosis, lysis of adhesions  - 3/7 repeat imaging concerning for ongoing/new SBO, resolved    > Continues with BMs    #Large hernia, chronic  - Non-operative, chronic  - re-eval on CT chest/abd/pelvis 3/7 stable    Renal/Genitourinary:  #AKI on CKD  #uremia   - Cr downtrending on CRRT, UOP ~100 in 24h  - RUS : Bilateral renal cysts measuring up to 2.6 cm in the right upper pole and 1.9 cm left interpolar kidney   - CRRT, decrease UF to 100, this will likely be last filter    > if NE requirement increases to 4, then back UF to 50  - Monitor for clinical signs of improvement  - Appreciate Nephrology's assistance    #Hydronephrosis:  - Caution with perioperative fluid resuscitation per Cards   - RUS completed:  No hydronephrosis.     #H/o bladder cancer s/p radical cystectomy with ileal conduit (2012):  #Parastomal hernia:  - SRU consulted, signed off 3/7  - Strict I&Os  - Foley catheter per stoma, if it falls out, please notify Urology to replace      Endocrine:   #hyperglycemia   - Glycemic Control: SSI, increase to 7 units NPH BID, monitor closely      Hematologic:   - Monitor CBC daily, transfuse for hgb 7mg /dL  - DVT ppx: heparin gtt    Immunologic/Infectious Disease:  - Afebrile  - Persistent leukocytosis, continue to monitor     - Cultures:   - Ucx (2/26): Mixed GP/GN organisms    - RPP (2/27): negative   - Blood cultures (3/4): NGTD @72h    - BAL (3/4): Pseudomonas aeruginosa   - BAL (3/8): 2+ on R, 1+ on L yeast    - Antimicrobials:    - Ceftriaxone 2/27- 2/28 - colonized UTI   - Zosyn (3/1 - 3/5) for abdominal contamination  - Zosyn (3/6) for pseudomonas when sensitivities pending  - Meropenum started (3/6-3/13) for Psedumonas PNA     Musculoskeletal:   #Deconditioned   - PT/OT when extubated     Daily Care Checklist:   - Stress Ulcer Prevention: No  - DVT Prophylaxis: Chemical: Heparin drip  - Daily Awakening: Yes  - Spontaneous Breathing Trial: Yes  - Indication for Central/PICC Line: Yes  Infusions requiring central access, Hemodynamic monitoring, and Inadequate peripheral access, RIJ triple lumen  - Indication for Urinary Catheter: Yes  Strict intake and output, Agressive diuresis/hydration, and Urinary retention/obstruction  - Diagnostic images/reports of past 24hrs reviewed: Yes    Disposition:   - Continue ICU care  - PT/OT consulted:  order placed given extubation today     SUBJECTIVE:      Intubated. Follows commands. Denies pain. Nods vigorously to question of if she wants ETT removed.  OBJECTIVE:     Physical Exam:  Constitutional: Lying supine, NAD  Neurologic: Opens eyes to voice. Moves all 4 extremities to command. Remains intubated. Pupils equal and responsive to light.   Respiratory: Mechanically ventilated, diminished bilaterally  Cardiovascular: Bradycardic ~55bpm, regular rhythm  Gastrointestinal: Soft abdomen, midline incision staples in tact. Urostomy with Foley in stoma draining clear yellow urine.   Musculoskeletal: Trace to no BLE/BUE edema.  Skin: Warm and dry     Temp:  [36.2 ??C (97.1 ??F)-36.6 ??C (97.8 ??F)] 36.3 ??C (97.3 ??F)  Heart Rate:  [53-121] 57  SpO2 Pulse:  [54-116] 55  Resp:  [13-25] 19  A BP-1: (83-138)/(46-86) 121/54  MAP:  [61 mmHg-98 mmHg] 78 mmHg  FiO2 (%):  [30 %-40 %] 30 %  SpO2:  [98 %-100 %] 100 %     Recent Laboratory Results:  Recent Labs     Units 03/09/24  0401   PHART  7.47*   PCO2ART mm Hg 36.6   PO2ART mm Hg 110.0   HCO3ART mmol/L 26   BEART  2.5*   O2SATART % 98.5     Recent Labs     Units 03/08/24  1200 03/08/24  2038 03/09/24  0401   NA mmol/L 139 137 139 - 136   K mmol/L 3.6 3.8 3.9 - 3.3*   CL mmol/L 105 98 100   CO2 mmol/L 22.0 26.0 27.0   BUN mg/dL 50* 44* 41*   CREATININE mg/dL 1.61* 0.96* 0.45*   GLU mg/dL 409* 811* 914*     Lab Results   Component Value Date    BILITOT 0.2 (L) 03/08/2024    BILITOT 0.2 (L) 03/03/2024    BILIDIR 0.10 03/08/2024    BILIDIR 0.10 03/03/2024    ALT <7 (L) 03/08/2024    ALT 9 (L) 03/03/2024    AST 10 03/08/2024    AST 22 03/03/2024    GGT 16 09/17/2011    GGT 354 (H) 05/03/2011    ALKPHOS 53 03/08/2024    ALKPHOS 58 03/03/2024    PROT 5.4 (L) 03/08/2024    PROT 5.5 (L) 03/03/2024    ALBUMIN 2.0 (L) 03/08/2024    ALBUMIN 2.4 (L) 03/03/2024     Recent Labs     Units 03/08/24  1159 03/08/24  1828 03/08/24  2037 03/08/24  2359   POCGLU mg/dL 782* 956* 213* 086*     Recent Labs     Units 03/07/24  0941 03/08/24  0357 03/09/24  0401   WBC 10*9/L 21.9* 23.9* 25.6*   RBC 10*12/L 3.01* 2.95* 3.01*   HGB g/dL 8.2* 8.3* 8.4*   HCT % 25.5* 24.7* 25.2*   MCV fL 84.7 83.5 84.0   MCH pg 27.3 28.1 27.8   MCHC g/dL 57.8 46.9 62.9   RDW % 18.2* 18.3* 17.6*   PLT 10*9/L 242 164 161   MPV fL 9.4 8.8 9.1     Recent Labs     Units 03/09/24  0401   APTT sec 44.0*      Lines & Tubes:   Patient Lines/Drains/Airways Status       Active Peripheral & Central Intravenous Access       Name Placement date Placement time Site Days    Peripheral IV 03/04/24 Anterior;Proximal;Right Forearm 03/04/24  0021  Forearm  5    Peripheral IV 03/07/24 Left Hand 03/07/24  0100  Hand  2    CVC Triple Lumen 03/07/24 Non-tunneled Left Internal jugular 03/07/24  3244  Internal jugular  1    Hemodialysis Catheter With Distal Infusion Port 03/06/24 Right Internal jugular 1.2 mL 1.2 mL 03/06/24  2301  Internal jugular  2                     Patient Lines/Drains/Airways Status       Active Wounds       Name Placement date Placement time Site Days    Surgical Site 04/08/18 Shoulder Left 04/08/18  1338  -- 2161    Surgical Site 06/05/22 Eye Left 06/05/22  0928  -- 642    Surgical Site 06/19/22 Eye Right 06/19/22  1024  -- 628    Surgical Site 03/03/24 Abdomen 03/03/24  1013  -- 5    Wound 06/24/20 Other (comment) Buttocks inner right buttock 06/24/20  1010  Buttocks  1353                     Respiratory/ventilator settings for last 24 hours:   Vent Mode: PSV-CPAP  FiO2 (%): 30 %  S RR: 18  S VT: 300 mL  PEEP: 8 cm H20  PR SUP: 10 cm H20    Intake/Output last 3 shifts:  I/O last 3 completed shifts:  In: 2500.9 [I.V.:1035.3; NG/GT:110; IV Piggyback:548.3]  Out: 4272 [Urine:205; Emesis/NG output:50; Drains:595; Other:3422]    Daily/Recent Weight:  68.9 kg (151 lb 14.4 oz)    BMI:  Body mass index is 28.72 kg/m??.    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    Chronic kidney disease     Depression, psychotic (CMS-HCC)     Diabetes mellitus (CMS-HCC)     in past    Emphysema of lung (CMS-HCC)     Financial difficulties     Frail elderly     Hearing impairment     Hernia     History of transfusion     Hyperlipidemia     Hypertension     Impaired mobility     Osteoporosis     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/08/2015    Visual impairment      Past Surgical History:   Procedure Laterality Date    ABDOMINAL SURGERY      BLADDER SURGERY      BREAST CYST EXCISION      CHEMOTHERAPY  2012    bladder    GALLBLADDER SURGERY      stone removal    ILEOSTOMY  2012    PR COLONOSCOPY FLX DX W/COLLJ SPEC WHEN PFRMD N/A 02/09/2015    Procedure: COLONOSCOPY, FLEXIBLE, PROXIMAL TO SPLENIC FLEXURE; DIAGNOSTIC, W/WO COLLECTION SPECIMEN BY BRUSH OR WASH;  Surgeon: Dewaine Conger, MD;  Location: [facility] [place];  Service: Gastroenterology    PR EXPLORATORY OF ABDOMEN N/A 02/28/2024    Procedure: EXPLORATORY LAPAROTOMY, EXPLORATORY CELIOTOMY WITH OR WITHOUT BIOPSY(S);  Surgeon: Renda Rolls, MD;  Location: [facility] OR [facility];  Service: General Surgery    PR RECONSTR TOTAL SHOULDER IMPLANT Left 04/08/2018    Procedure: ARTHROPLASTY, GLENOHUMERAL JOINT; TOTAL SHOULDER(GLENOID & PROXIMAL HUMERAL REPLACEMENT(EG, TOTAL SHOULDER);  Surgeon: Tomasa Rand, MD;  Location[facility] OR [facility];  Service: Ortho Sports Medicine    PR  REOPEN RECENT ABD EXPLORATORY Midline 03/01/2024    Procedure: REOPENING OF RECENT LAPAROTOMY;  Surgeon: [name]., MD;  Location: OR [facility];  Service: Trauma    PR REOPEN RECENT ABD EXPLORATORY N/A 03/03/2024    Procedure: REOPENING OF RECENT LAPAROTOMY;  Surgeon: Joanie Coddington, MD;  Location: OR [facility]; Service: Trauma    PR SIGMOIDOSCOPY,BIOPSY N/A 03/11/2015    Procedure: SIGMOIDOSCOPY, FLEXIBLE; WITH BIOPSY, SINGLE OR MULTIPLE;  Surgeon: Wilburt Finlay, MD;  [facility];  Service: Gastroenterology    PR XCAPSL CTRC RMVL INSJ IO LENS PROSTH W/O ECP Left 06/05/2022    Procedure: EXTRACAPSULAR CATARACT REMOVAL W/INSERTION OF INTRAOCULAR LENS PROSTHESIS, MANUAL OR MECHANICAL TECHNIQUE WITHOUT ENDOSCOPIC CYCLOPHOTOCOAGULATION;  Surgeon: Garner Gavel, MD;  Location: [facility] OR [facility];  Service: Ophthalmology    PR XCAPSL CTRC RMVL INSJ IO LENS PROSTH W/O ECP Right 06/19/2022    Procedure: EXTRACAPSULAR CATARACT REMOVAL W/INSERTION OF INTRAOCULAR LENS PROSTHESIS, MANUAL OR MECHANICAL TECHNIQUE WITHOUT ENDOSCOPIC CYCLOPHOTOCOAGULATION;  Surgeon: Garner Gavel, MD;  Location: [facility] OR [facility];  Service: Ophthalmology     Scheduled Medications:   [START ON 03/10/2024] amiodarone  150 mg Intravenous daily    aspirin  81 mg Enteral tube: gastric Daily    atorvastatin  40 mg Enteral tube: gastric Daily    budesonide (PULMICORT) nebulizer solution  0.5 mg Nebulization BID (RT)    fentaNYL (PF)  50 mcg Intravenous Once    flu vacc ts2024-25(15yr up)-PF  0.5 mL Intramuscular During hospitalization    [START ON 03/14/2024] fluPHENAZine decanoate  50 mg Intramuscular Q30 Days    insulin NPH  5 Units Subcutaneous Q12H SCH    insulin regular  0-20 Units Subcutaneous Q6H SCH    iohexol  50 mL Oral Once    ipratropium  500 mcg Nebulization Q6H (RT)    meropenem  500 mg Intravenous Q8H    pantoprazole (Protonix) intravenous solution  40 mg Intravenous Daily    [Provider Hold] polyethylene glycol  17 g Oral Daily    [Provider Hold] sennosides  2.5 mL Oral Nightly    sodium chloride  10 mL Intravenous Q8H    sodium chloride  10 mL Intravenous Q8H    sodium chloride  10 mL Intravenous Q8H     Continuous Infusions:   amiodarone 0.5 mg/min (03/08/24 2000)    heparin 13 Units/kg/hr (03/09/24 0156)    NORepinephrine bitartrate-NS 3 mcg/min (03/09/24 0034)    NxStage RFP 400 (+/- BB) 5000 mL - contains 2 mEq/L of potassium      NxStage/multiBic RFP 401 (+/- BB) 5000 mL - contains 4 mEq/L of potassium      Parenteral Nutrition (CENTRAL) 40 mL/hr at 03/08/24 2155     PRN Medications:  albuterol, [Provider Hold] cyclobenzaprine, dextrose in water, heparin (porcine), heparin (porcine), nicotine polacrilex **OR** nicotine polacrilex, ondansetron, oxyCODONE **OR** oxyCODONE    Oliver Hum, MD   Surgical Intensive Care Unit  [place] [facility] [place] at [facility]

## 2024-03-09 NOTE — Unmapped (Signed)
 Pt remains on HFNC tolerating well. VSS and no distress noted. Txs given as ordered will continue to monitor closely.

## 2024-03-09 NOTE — Unmapped (Signed)
 [facility] Continuous Renal Replacement Therapy Procedure Note     Assessment and Plan:  #Acute Kidney Injury on CRRT: Due to ATN.   - Access: Right IJ non-tunneled catheter . Adequate function, no intervention needed.  - Ultrafiltration goal: 100  - Anticoagulation: None  - Current plan is to transition to iHD when cartridge runs out or clots off.  - Obtain BMP, Mg, Phos Q8h. Goal is for K, Mg, Phos within normal range.  - Dose all medications for CRRT at ordered therapy fluid rate  - This procedure was fully reviewed with the patient and/or their decision-maker. The risks, benefits, and alternatives were discussed prior to the procedure. All questions were answered and written informed consent was obtained.    Dudley Major, MD  03/09/2024 9:40 AM    Subjective/Interval Events: Pt was seen and examined on CRRT. Currently on SPONT potential extubation today minimal urine output    Physical Exam:   Vitals:    03/08/24 2300 03/09/24 0000 03/09/24 0231 03/09/24 0845   BP:       Pulse: 55 56 57    Resp:  20 19    Temp:  36.3 ??C (97.3 ??F)     TempSrc:  Axillary     SpO2: 100% 100% 100%    Weight:       Height:   154.9 cm (5' 0.98) 154.9 cm (5' 0.98)     No intake/output data recorded.    Intake/Output Summary (Last 24 hours) at 03/09/2024 0940  Last data filed at 03/09/2024 0600  Gross per 24 hour   Intake 1315.02 ml   Output 3276 ml   Net -1960.98 ml     General: Appearing ill  Pulmonary: normal and intubatd  Cardiovascular: regular rate and rhythm  Extremities: no significant edema     Lab Data:  Recent Labs     Units 03/03/24  2149 03/04/24  0340 03/07/24  2143 03/08/24  0357 03/08/24  0909 03/08/24  1200 03/08/24  2038 03/09/24  0401   NA mmol/L 147*   < >  --  140   < > 139 137 139 - 136   K mmol/L 4.2   < >  --  3.4   < > 3.6 3.8 3.9 - 3.3*   CL mmol/L 109*   < >  --  104  --  105 98 100   CO2 mmol/L 20.0   < >  --  21.0  --  22.0 26.0 27.0   BUN mg/dL 80*   < >  --  62*  --  50* 44* 41*   CREATININE mg/dL 6.96* < >  --  2.95*  --  1.67* 1.41* 1.21*   CALCIUM mg/dL 8.8   < >  --  8.2*  --  7.8* 8.6* 8.3*   ALBUMIN g/dL 2.4*  --   --  2.0*  --   --   --   --    PHOS mg/dL  --    < > 4.2 3.5  --  2.5  --   --     < > = values in this interval not displayed.     Recent Labs     Units 03/07/24  0941 03/08/24  0357 03/09/24  0401   WBC 10*9/L 21.9* 23.9* 25.6*   HGB g/dL 8.2* 8.3* 8.4*   HCT % 25.5* 24.7* 25.2*   PLT 10*9/L 242 164 161     Recent Labs  Units 03/04/24  1529 03/04/24  2318 03/07/24  1227 03/08/24  0357 03/09/24  0401   INR  1.06  --   --   --   --    APTT sec 29.3   < > 62.6* 52.7* 44.0*    < > = values in this interval not displayed.

## 2024-03-09 NOTE — Unmapped (Signed)
 Problem: Adult Inpatient Plan of Care  Goal: Plan of Care Review  Outcome: Ongoing - Unchanged  Goal: Patient-Specific Goal (Individualized)  Outcome: Ongoing - Unchanged  Goal: Absence of Hospital-Acquired Illness or Injury  Outcome: Ongoing - Unchanged  Intervention: Identify and Manage Fall Risk  Recent Flowsheet Documentation  Taken 03/09/2024 1800 by Lattie Corns, Joan Mayans S, RN  Safety Interventions:   aspiration precautions   family at bedside  Taken 03/09/2024 1600 by Christene Slates S, RN  Safety Interventions:   aspiration precautions   family at bedside  Taken 03/09/2024 1400 by Lattie Corns, [place] S, RN  Safety Interventions:   aspiration precautions   family at bedside  Taken 03/09/2024 1200 by Clayborn Bigness, RN  Safety Interventions:   aspiration precautions   family at bedside  Taken 03/09/2024 1000 by Clayborn Bigness, RN  Safety Interventions:   aspiration precautions   family at bedside  Intervention: Prevent Skin Injury  Recent Flowsheet Documentation  Taken 03/09/2024 1800 by Christene Slates S, RN  Positioning for Skin: Right  Device Skin Pressure Protection: absorbent pad utilized/changed  Taken 03/09/2024 1600 by Lattie Corns, [place] S, RN  Positioning for Skin: Left  Device Skin Pressure Protection: absorbent pad utilized/changed  Taken 03/09/2024 1400 by Lattie Corns, [place] S, RN  Positioning for Skin: Right  Device Skin Pressure Protection: absorbent pad utilized/changed  Taken 03/09/2024 1200 by Lattie Corns, Joan Mayans S, RN  Device Skin Pressure Protection: absorbent pad utilized/changed  Taken 03/09/2024 1000 by Christene Slates S, RN  Positioning for Skin: Left  Device Skin Pressure Protection: absorbent pad utilized/changed  Taken 03/09/2024 0800 by Lattie Corns, Joan Mayans S, RN  Positioning for Skin: Right  Device Skin Pressure Protection: absorbent pad utilized/changed  Intervention: Prevent and Manage VTE (Venous Thromboembolism) Risk  Recent Flowsheet Documentation  Taken 03/09/2024 1600 by Lattie Corns, [place] S, RN  Anti-Embolism Device Type: SCD, Knee  Anti-Embolism Device Status: On  Anti-Embolism Device Location: BLE  Taken 03/09/2024 1200 by Lattie Corns, [place] S, RN  Anti-Embolism Device Type: SCD, Knee  Anti-Embolism Device Status: On  Anti-Embolism Device Location: BLE  Taken 03/09/2024 0800 by Lattie Corns, [place] S, RN  Anti-Embolism Device Type: SCD, Knee  Anti-Embolism Device Status: On  Anti-Embolism Device Location: BLE  Intervention: Prevent Infection  Recent Flowsheet Documentation  Taken 03/09/2024 1800 by Lattie Corns, Ara Kussmaul, RN  Infection Prevention: hand hygiene promoted  Taken 03/09/2024 1600 by Clayborn Bigness, RN  Infection Prevention: hand hygiene promoted  Taken 03/09/2024 1400 by Lattie Corns, Ara Kussmaul, RN  Infection Prevention: hand hygiene promoted  Taken 03/09/2024 1200 by Christene Slates S, RN  Infection Prevention: hand hygiene promoted  Taken 03/09/2024 1000 by Lattie Corns, [place] S, RN  Infection Prevention: hand hygiene promoted  Goal: Optimal Comfort and Wellbeing  Outcome: Ongoing - Unchanged  Goal: Readiness for Transition of Care  Outcome: Ongoing - Unchanged  Goal: Rounds/Family Conference  Outcome: Ongoing - Unchanged     Problem: Infection  Goal: Absence of Infection Signs and Symptoms  Outcome: Ongoing - Unchanged  Intervention: Prevent or Manage Infection  Recent Flowsheet Documentation  Taken 03/09/2024 1800 by Lattie Corns, Ara Kussmaul, RN  Infection Management: aseptic technique maintained  Taken 03/09/2024 1600 by Lattie Corns, [place] S, RN  Infection Management: aseptic technique maintained  Taken 03/09/2024 1400 by Lattie Corns, [place] S, RN  Infection Management: aseptic technique maintained  Taken 03/09/2024 1200 by [place], [place],  RN  Infection Management: aseptic technique maintained  Taken 03/09/2024 1000 by Lattie Corns, Ara Kussmaul, RN  Infection Management: aseptic technique maintained     Problem: Fall Injury Risk  Goal: Absence of Fall and Fall-Related Injury  Outcome: Ongoing - Unchanged  Intervention: Promote Scientist, clinical (histocompatibility and immunogenetics) Documentation  Taken 03/09/2024 1800 by Clayborn Bigness, RN  Safety Interventions:   aspiration precautions   family at bedside  Taken 03/09/2024 1600 by Clayborn Bigness, RN  Safety Interventions:   aspiration precautions   family at bedside  Taken 03/09/2024 1400 by Clayborn Bigness, RN  Safety Interventions:   aspiration precautions   family at bedside  Taken 03/09/2024 1200 by Clayborn Bigness, RN  Safety Interventions:   aspiration precautions   family at bedside  Taken 03/09/2024 1000 by Clayborn Bigness, RN  Safety Interventions:   aspiration precautions   family at bedside     Problem: Wound  Goal: Optimal Coping  Outcome: Ongoing - Unchanged  Goal: Optimal Functional Ability  Outcome: Ongoing - Unchanged  Goal: Absence of Infection Signs and Symptoms  Outcome: Ongoing - Unchanged  Intervention: Prevent or Manage Infection  Recent Flowsheet Documentation  Taken 03/09/2024 1800 by Clayborn Bigness, RN  Infection Management: aseptic technique maintained  Taken 03/09/2024 1600 by Lattie Corns, Ara Kussmaul, RN  Infection Management: aseptic technique maintained  Taken 03/09/2024 1400 by Lattie Corns, Ara Kussmaul, RN  Infection Management: aseptic technique maintained  Taken 03/09/2024 1200 by Lattie Corns, Ara Kussmaul, RN  Infection Management: aseptic technique maintained  Taken 03/09/2024 1000 by Lattie Corns, [place] S, RN  Infection Management: aseptic technique maintained  Goal: Improved Oral Intake  Outcome: Ongoing - Unchanged  Goal: Optimal Pain Control and Function  Outcome: Ongoing - Unchanged  Goal: Skin Health and Integrity  Outcome: Ongoing - Unchanged  Intervention: Optimize Skin Protection  Recent Flowsheet Documentation  Taken 03/09/2024 1800 by Lattie Corns, Ara Kussmaul, RN  Pressure Reduction Techniques: frequent weight shift encouraged  Head of Bed (HOB) Positioning: HOB at 45 degrees  Pressure Reduction Devices: positioning supports utilized  Taken 03/09/2024 1700 by Lattie Corns, Ara Kussmaul, RN  Head of Bed [facility]) Positioning: HOB at 45 degrees  Taken 03/09/2024 1600 by Lattie Corns, [place] S, RN  Pressure Reduction Techniques: frequent weight shift encouraged  Head of Bed (HOB) Positioning: HOB at 45 degrees  Pressure Reduction Devices: positioning supports utilized  Taken 03/09/2024 1500 by Lattie Corns, Ara Kussmaul, RN  Head of Bed [facility]) Positioning: HOB at 45 degrees  Taken 03/09/2024 1400 by Lattie Corns, [place] S, RN  Pressure Reduction Techniques: frequent weight shift encouraged  Head of Bed (HOB) Positioning: HOB at 45 degrees  Pressure Reduction Devices: positioning supports utilized  Taken 03/09/2024 1300 by Lattie Corns, Ara Kussmaul, RN  Head of Bed [facility]) Positioning: HOB at 45 degrees  Taken 03/09/2024 1200 by Lattie Corns, [place] S, RN  Pressure Reduction Techniques: frequent weight shift encouraged  Head of Bed (HOB) Positioning: HOB at 45 degrees  Pressure Reduction Devices: positioning supports utilized  Taken 03/09/2024 1100 by Lattie Corns, [place] S, RN  Head of Bed [facility]) Positioning: HOB at 45 degrees  Taken 03/09/2024 1000 by Lattie Corns, [place] S, RN  Pressure Reduction Techniques: frequent weight shift encouraged  Head of Bed (HOB) Positioning: HOB at 45 degrees  Pressure Reduction Devices: positioning supports utilized  Taken 03/09/2024 0900 by Lattie Corns, Ara Kussmaul, RN  Head  of Bed [facility]) Positioning: HOB at 45 degrees  Taken 03/09/2024 0800 by Lattie Corns, Ara Kussmaul, RN  Pressure Reduction Techniques: frequent weight shift encouraged  Head of Bed (HOB) Positioning: HOB at 45 degrees  Pressure Reduction Devices: positioning supports utilized  Taken 03/09/2024 0700 by Lattie Corns, Ara Kussmaul, RN  Head of Bed [facility]) Positioning: HOB at 45 degrees  Goal: Optimal Wound Healing  Outcome: Ongoing - Unchanged     Problem: Skin Injury Risk Increased  Goal: Skin Health and Integrity  Outcome: Ongoing - Unchanged  Intervention: Optimize Skin Protection  Recent Flowsheet Documentation  Taken 03/09/2024 1800 by Lattie Corns, Ara Kussmaul, RN  Pressure Reduction Techniques: frequent weight shift encouraged  Head of Bed (HOB) Positioning: HOB at 45 degrees  Pressure Reduction Devices: positioning supports utilized  Taken 03/09/2024 1700 by Lattie Corns, Ara Kussmaul, RN  Head of Bed [facility]) Positioning: HOB at 45 degrees  Taken 03/09/2024 1600 by Lattie Corns, [place] S, RN  Pressure Reduction Techniques: frequent weight shift encouraged  Head of Bed (HOB) Positioning: HOB at 45 degrees  Pressure Reduction Devices: positioning supports utilized  Taken 03/09/2024 1500 by Lattie Corns, Ara Kussmaul, RN  Head of Bed [facility]) Positioning: HOB at 45 degrees  Taken 03/09/2024 1400 by Lattie Corns, [place] S, RN  Pressure Reduction Techniques: frequent weight shift encouraged  Head of Bed (HOB) Positioning: HOB at 45 degrees  Pressure Reduction Devices: positioning supports utilized  Taken 03/09/2024 1300 by Lattie Corns, Ara Kussmaul, RN  Head of Bed [facility]) Positioning: HOB at 45 degrees  Taken 03/09/2024 1200 by Lattie Corns, Joan Mayans S, RN  Pressure Reduction Techniques: frequent weight shift encouraged  Head of Bed (HOB) Positioning: HOB at 45 degrees  Pressure Reduction Devices: positioning supports utilized  Taken 03/09/2024 1100 by Lattie Corns, Ara Kussmaul, RN  Head of Bed [facility]) Positioning: HOB at 45 degrees  Taken 03/09/2024 1000 by Lattie Corns, [place] S, RN  Pressure Reduction Techniques: frequent weight shift encouraged  Head of Bed (HOB) Positioning: HOB at 45 degrees  Pressure Reduction Devices: positioning supports utilized  Taken 03/09/2024 0900 by Lattie Corns, Ara Kussmaul, RN  Head of Bed [facility]) Positioning: HOB at 45 degrees  Taken 03/09/2024 0800 by Lattie Corns, Ara Kussmaul, RN  Pressure Reduction Techniques: frequent weight shift encouraged  Head of Bed (HOB) Positioning: HOB at 45 degrees  Pressure Reduction Devices: positioning supports utilized  Taken 03/09/2024 0700 by [place], [place] S, RN  Head of Bed [facility]) Positioning: HOB at 45 degrees     Problem: Self-Care Deficit  Goal: Improved Ability to Complete Activities of Daily Living  Outcome: Ongoing - Unchanged     Problem: Mechanical Ventilation Invasive  Goal: Effective Communication  Outcome: Ongoing - Unchanged  Goal: Optimal Device Function  Outcome: Ongoing - Unchanged  Intervention: Optimize Device Care and Function  Recent Flowsheet Documentation  Taken 03/09/2024 1800 by Clayborn Bigness, RN  Oral Care: suction provided  Taken 03/09/2024 1600 by Clayborn Bigness, RN  Oral Care: suction provided  Taken 03/09/2024 1400 by Clayborn Bigness, RN  Oral Care: suction provided  Taken 03/09/2024 1200 by Clayborn Bigness, RN  Airway/Ventilation Management: airway patency maintained  Oral Care: suction provided  Taken 03/09/2024 1000 by Clayborn Bigness, RN  Oral Care: suction provided  Taken 03/09/2024 0800 by Clayborn Bigness, RN  Airway/Ventilation Management: airway patency maintained  Oral Care: suction provided  Goal: Mechanical Ventilation Liberation  Outcome: Ongoing - Unchanged  Goal: Optimal Nutrition Delivery  Outcome: Ongoing - Unchanged  Goal: Absence of Device-Related Skin and Tissue Injury  Outcome: Ongoing - Unchanged  Intervention: Maintain Skin and Tissue Health  Recent Flowsheet Documentation  Taken 03/09/2024 1800 by Lattie Corns, Joan Mayans S, RN  Device Skin Pressure Protection: absorbent pad utilized/changed  Taken 03/09/2024 1600 by Lattie Corns, [place] S, RN  Device Skin Pressure Protection: absorbent pad utilized/changed  Taken 03/09/2024 1400 by Lattie Corns, [place] S, RN  Device Skin Pressure Protection: absorbent pad utilized/changed  Taken 03/09/2024 1200 by Clayborn Bigness, RN  Device Skin Pressure Protection: absorbent pad utilized/changed  Taken 03/09/2024 1000 by Clayborn Bigness, RN  Device Skin Pressure Protection: absorbent pad utilized/changed  Taken 03/09/2024 0800 by Christene Slates S, RN  Device Skin Pressure Protection: absorbent pad utilized/changed  Goal: Absence of Ventilator-Induced Lung Injury  Outcome: Ongoing - Unchanged  Intervention: Prevent Ventilator-Associated Pneumonia  Recent Flowsheet Documentation  Taken 03/09/2024 1800 by Lattie Corns, Ara Kussmaul, RN  Head of Bed [facility]) Positioning: HOB at 45 degrees  Oral Care: suction provided  Taken 03/09/2024 1700 by Lattie Corns, Ara Kussmaul, RN  Head of Bed [facility]) Positioning: HOB at 45 degrees  Taken 03/09/2024 1600 by Lattie Corns, [place] S, RN  Head of Bed [facility]) Positioning: HOB at 45 degrees  Oral Care: suction provided  Taken 03/09/2024 1500 by Lattie Corns, Ara Kussmaul, RN  Head of Bed [facility]) Positioning: HOB at 45 degrees  Taken 03/09/2024 1400 by Lattie Corns, [place] S, RN  Head of Bed [facility]) Positioning: HOB at 45 degrees  Oral Care: suction provided  Taken 03/09/2024 1300 by Lattie Corns, Ara Kussmaul, RN  Head of Bed [facility]) Positioning: HOB at 45 degrees  Taken 03/09/2024 1200 by Lattie Corns, [place] S, RN  Head of Bed [facility]) Positioning: HOB at 45 degrees  Oral Care: suction provided  Taken 03/09/2024 1100 by Lattie Corns, [place] S, RN  Head of Bed [facility]) Positioning: HOB at 45 degrees  Taken 03/09/2024 1000 by Lattie Corns, [place] S, RN  Head of Bed [facility]) Positioning: HOB at 45 degrees  Oral Care: suction provided  Taken 03/09/2024 0900 by Lattie Corns, Ara Kussmaul, RN  Head of Bed [facility]) Positioning: HOB at 45 degrees  Taken 03/09/2024 0800 by Lattie Corns, [place] S, RN  Head of Bed [facility]) Positioning: HOB at 45 degrees  Oral Care: suction provided  Taken 03/09/2024 0700 by Lattie Corns, Ara Kussmaul, RN  Head of Bed [facility]) Positioning: HOB at 45 degrees     Problem: Artificial Airway  Goal: Effective Communication  Outcome: Ongoing - Unchanged  Goal: Optimal Device Function  Outcome: Ongoing - Unchanged  Intervention: Optimize Device Care and Function  Recent Flowsheet Documentation  Taken 03/09/2024 1800 by Clayborn Bigness, RN  Aspiration Precautions: oral hygiene care promoted  Oral Care: suction provided  Taken 03/09/2024 1600 by Lattie Corns, Ara Kussmaul, RN  Aspiration Precautions: oral hygiene care promoted  Oral Care: suction provided  Taken 03/09/2024 1400 by Lattie Corns, Ara Kussmaul, RN  Aspiration Precautions: oral hygiene care promoted  Oral Care: suction provided  Taken 03/09/2024 1200 by Clayborn Bigness, RN  Airway/Ventilation Management: airway patency maintained  Aspiration Precautions: oral hygiene care promoted  Oral Care: suction provided  Taken 03/09/2024 1000 by Clayborn Bigness, RN  Aspiration Precautions: oral hygiene care promoted  Oral Care: suction provided  Taken 03/09/2024 0800 by Lattie Corns, [place],  RN  Airway/Ventilation Management: airway patency maintained  Oral Care: suction provided  Goal: Absence of Device-Related Skin or Tissue Injury  Outcome: Ongoing - Unchanged  Intervention: Maintain Skin and Tissue Health  Recent Flowsheet Documentation  Taken 03/09/2024 1800 by Lattie Corns, Joan Mayans S, RN  Device Skin Pressure Protection: absorbent pad utilized/changed  Taken 03/09/2024 1600 by Lattie Corns, [place] S, RN  Device Skin Pressure Protection: absorbent pad utilized/changed  Taken 03/09/2024 1400 by Lattie Corns, Joan Mayans S, RN  Device Skin Pressure Protection: absorbent pad utilized/changed  Taken 03/09/2024 1200 by Clayborn Bigness, RN  Device Skin Pressure Protection: absorbent pad utilized/changed  Taken 03/09/2024 1000 by Clayborn Bigness, RN  Device Skin Pressure Protection: absorbent pad utilized/changed  Taken 03/09/2024 0800 by Clayborn Bigness, RN  Device Skin Pressure Protection: absorbent pad utilized/changed     Problem: Adult Inpatient Plan of Care  Goal: Plan of Care Review  Outcome: Ongoing - Unchanged     Problem: Adult Inpatient Plan of Care  Goal: Patient-Specific Goal (Individualized)  Outcome: Ongoing - Unchanged     Problem: Adult Inpatient Plan of Care  Goal: Absence of Hospital-Acquired Illness or Injury  Outcome: Ongoing - Unchanged  Intervention: Identify and Manage Fall Risk  Recent Flowsheet Documentation  Taken 03/09/2024 1800 by Lattie Corns, Joan Mayans S, RN  Safety Interventions:   aspiration precautions   family at bedside  Taken 03/09/2024 1600 by Lattie Corns, Joan Mayans S, RN  Safety Interventions: aspiration precautions   family at bedside  Taken 03/09/2024 1400 by Lattie Corns, [place] S, RN  Safety Interventions:   aspiration precautions   family at bedside  Taken 03/09/2024 1200 by Clayborn Bigness, RN  Safety Interventions:   aspiration precautions   family at bedside  Taken 03/09/2024 1000 by Clayborn Bigness, RN  Safety Interventions:   aspiration precautions   family at bedside  Intervention: Prevent Skin Injury  Recent Flowsheet Documentation  Taken 03/09/2024 1800 by Clayborn Bigness, RN  Positioning for Skin: Right  Device Skin Pressure Protection: absorbent pad utilized/changed  Taken 03/09/2024 1600 by Lattie Corns, [place] S, RN  Positioning for Skin: Left  Device Skin Pressure Protection: absorbent pad utilized/changed  Taken 03/09/2024 1400 by Lattie Corns, [place] S, RN  Positioning for Skin: Right  Device Skin Pressure Protection: absorbent pad utilized/changed  Taken 03/09/2024 1200 by Lattie Corns, Joan Mayans S, RN  Device Skin Pressure Protection: absorbent pad utilized/changed  Taken 03/09/2024 1000 by Christene Slates S, RN  Positioning for Skin: Left  Device Skin Pressure Protection: absorbent pad utilized/changed  Taken 03/09/2024 0800 by Lattie Corns, Joan Mayans S, RN  Positioning for Skin: Right  Device Skin Pressure Protection: absorbent pad utilized/changed  Intervention: Prevent and Manage VTE (Venous Thromboembolism) Risk  Recent Flowsheet Documentation  Taken 03/09/2024 1600 by Lattie Corns, [place] S, RN  Anti-Embolism Device Type: SCD, Knee  Anti-Embolism Device Status: On  Anti-Embolism Device Location: BLE  Taken 03/09/2024 1200 by Lattie Corns, [place] S, RN  Anti-Embolism Device Type: SCD, Knee  Anti-Embolism Device Status: On  Anti-Embolism Device Location: BLE  Taken 03/09/2024 0800 by Lattie Corns, [place] S, RN  Anti-Embolism Device Type: SCD, Knee  Anti-Embolism Device Status: On  Anti-Embolism Device Location: BLE  Intervention: Prevent Infection  Recent Flowsheet Documentation  Taken 03/09/2024 1800 by Clayborn Bigness, RN  Infection Prevention: hand hygiene promoted  Taken 03/09/2024 1600 by Clayborn Bigness, RN  Infection Prevention: hand hygiene  promoted  Taken 03/09/2024 1400 by Lattie Corns, Ara Kussmaul, RN  Infection Prevention: hand hygiene promoted  Taken 03/09/2024 1200 by Lattie Corns, Ara Kussmaul, RN  Infection Prevention: hand hygiene promoted  Taken 03/09/2024 1000 by Lattie Corns, Ara Kussmaul, RN  Infection Prevention: hand hygiene promoted     Problem: Adult Inpatient Plan of Care  Goal: Absence of Hospital-Acquired Illness or Injury  Intervention: Identify and Manage Fall Risk  Recent Flowsheet Documentation  Taken 03/09/2024 1800 by Christene Slates S, RN  Safety Interventions:   aspiration precautions   family at bedside  Taken 03/09/2024 1600 by Clayborn Bigness, RN  Safety Interventions:   aspiration precautions   family at bedside  Taken 03/09/2024 1400 by Clayborn Bigness, RN  Safety Interventions:   aspiration precautions   family at bedside  Taken 03/09/2024 1200 by Clayborn Bigness, RN  Safety Interventions:   aspiration precautions   family at bedside  Taken 03/09/2024 1000 by Clayborn Bigness, RN  Safety Interventions:   aspiration precautions   family at bedside     Problem: Adult Inpatient Plan of Care  Goal: Absence of Hospital-Acquired Illness or Injury  Intervention: Prevent Skin Injury  Recent Flowsheet Documentation  Taken 03/09/2024 1800 by Clayborn Bigness, RN  Positioning for Skin: Right  Device Skin Pressure Protection: absorbent pad utilized/changed  Taken 03/09/2024 1600 by Lattie Corns, [place] S, RN  Positioning for Skin: Left  Device Skin Pressure Protection: absorbent pad utilized/changed  Taken 03/09/2024 1400 by Lattie Corns, Ara Kussmaul, RN  Positioning for Skin: Right  Device Skin Pressure Protection: absorbent pad utilized/changed  Taken 03/09/2024 1200 by Clayborn Bigness, RN  Device Skin Pressure Protection: absorbent pad utilized/changed  Taken 03/09/2024 1000 by Clayborn Bigness, RN  Positioning for Skin: Left  Device Skin Pressure Protection: absorbent pad utilized/changed  Taken 03/09/2024 0800 by Lattie Corns, Ara Kussmaul, RN  Positioning for Skin: Right  Device Skin Pressure Protection: absorbent pad utilized/changed     Problem: Adult Inpatient Plan of Care  Goal: Absence of Hospital-Acquired Illness or Injury  Intervention: Prevent and Manage VTE (Venous Thromboembolism) Risk  Recent Flowsheet Documentation  Taken 03/09/2024 1600 by Lattie Corns, [place] S, RN  Anti-Embolism Device Type: SCD, Knee  Anti-Embolism Device Status: On  Anti-Embolism Device Location: BLE  Taken 03/09/2024 1200 by Lattie Corns, [place] S, RN  Anti-Embolism Device Type: SCD, Knee  Anti-Embolism Device Status: On  Anti-Embolism Device Location: BLE  Taken 03/09/2024 0800 by Lattie Corns, [place] S, RN  Anti-Embolism Device Type: SCD, Knee  Anti-Embolism Device Status: On  Anti-Embolism Device Location: BLE

## 2024-03-09 NOTE — Unmapped (Signed)
 Nutrition Support Team: PN Sign Off    Patient tolerating TF @ 40 ml/hr (goal 60 ml/hr). Held overnight with plans to resume this afternoon per d/w Dr. Oliver Hum.  Team requesting TPN to let expire given TF tolerance in the setting of multiple BM in the past 48h.  PN team will sign off.  Nutrition care to return to service RD.    Thank you,  Meyer Russel, PharmD, BCPS, BCNSP, BCCCP  Pager [phone]

## 2024-03-10 LAB — CBC
HEMATOCRIT: 24.4 % — ABNORMAL LOW (ref 34.0–44.0)
HEMOGLOBIN: 8 g/dL — ABNORMAL LOW (ref 11.3–14.9)
MEAN CORPUSCULAR HEMOGLOBIN CONC: 32.8 g/dL (ref 32.0–36.0)
MEAN CORPUSCULAR HEMOGLOBIN: 27.4 pg (ref 25.9–32.4)
MEAN CORPUSCULAR VOLUME: 83.4 fL (ref 77.6–95.7)
MEAN PLATELET VOLUME: 9.6 fL (ref 6.8–10.7)
PLATELET COUNT: 149 10*9/L — ABNORMAL LOW (ref 150–450)
RED BLOOD CELL COUNT: 2.93 10*12/L — ABNORMAL LOW (ref 3.95–5.13)
RED CELL DISTRIBUTION WIDTH: 17.9 % — ABNORMAL HIGH (ref 12.2–15.2)
WBC ADJUSTED: 22.6 10*9/L — ABNORMAL HIGH (ref 3.6–11.2)

## 2024-03-10 LAB — APTT
APTT: 35.4 s (ref 24.8–38.4)
APTT: 46.9 s — ABNORMAL HIGH (ref 24.8–38.4)
HEPARIN CORRELATION: 0.2
HEPARIN CORRELATION: 0.3

## 2024-03-10 LAB — BLOOD GAS CRITICAL CARE PANEL, ARTERIAL
BASE EXCESS ARTERIAL: 0.7 (ref -2.0–2.0)
CALCIUM IONIZED ARTERIAL (MG/DL): 4.86 mg/dL (ref 4.40–5.40)
GLUCOSE WHOLE BLOOD: 222 mg/dL — ABNORMAL HIGH (ref 70–179)
HCO3 ARTERIAL: 25 mmol/L (ref 22–27)
HEMOGLOBIN BLOOD GAS: 7.6 g/dL — ABNORMAL LOW (ref 12.00–16.00)
LACTATE BLOOD ARTERIAL: 1.8 mmol/L — ABNORMAL HIGH (ref <1.3)
O2 SATURATION ARTERIAL: 98.6 % (ref 94.0–100.0)
PCO2 ARTERIAL: 37.5 mmHg (ref 35.0–45.0)
PH ARTERIAL: 7.43 (ref 7.35–7.45)
PO2 ARTERIAL: 110 mmHg (ref 80.0–110.0)
POTASSIUM WHOLE BLOOD: 3.4 mmol/L (ref 3.4–4.6)
SODIUM WHOLE BLOOD: 135 mmol/L (ref 135–145)

## 2024-03-10 LAB — BASIC METABOLIC PANEL
ANION GAP: 11 mmol/L (ref 5–14)
ANION GAP: 15 mmol/L — ABNORMAL HIGH (ref 5–14)
BLOOD UREA NITROGEN: 33 mg/dL — ABNORMAL HIGH (ref 9–23)
BLOOD UREA NITROGEN: 39 mg/dL — ABNORMAL HIGH (ref 9–23)
BUN / CREAT RATIO: 29
BUN / CREAT RATIO: 28
CALCIUM: 8.2 mg/dL — ABNORMAL LOW (ref 8.7–10.4)
CALCIUM: 8.3 mg/dL — ABNORMAL LOW (ref 8.7–10.4)
CHLORIDE: 99 mmol/L (ref 98–107)
CHLORIDE: 98 mmol/L (ref 98–107)
CO2: 26 mmol/L (ref 20.0–31.0)
CO2: 25 mmol/L (ref 20.0–31.0)
CREATININE: 1.13 mg/dL — ABNORMAL HIGH (ref 0.55–1.02)
CREATININE: 1.37 mg/dL — ABNORMAL HIGH (ref 0.55–1.02)
EGFR CKD-EPI (2021) FEMALE: 49 mL/min/{1.73_m2} — ABNORMAL LOW (ref >=60)
EGFR CKD-EPI (2021) FEMALE: 39 mL/min/{1.73_m2} — ABNORMAL LOW (ref >=60)
GLUCOSE RANDOM: 227 mg/dL — ABNORMAL HIGH (ref 70–179)
GLUCOSE RANDOM: 280 mg/dL — ABNORMAL HIGH (ref 70–179)
POTASSIUM: 3.4 mmol/L (ref 3.4–4.8)
POTASSIUM: 4.5 mmol/L (ref 3.4–4.8)
SODIUM: 136 mmol/L (ref 135–145)
SODIUM: 138 mmol/L (ref 135–145)

## 2024-03-10 LAB — IONIZED CALCIUM VENOUS: CALCIUM IONIZED VENOUS (MG/DL): 4.85 mg/dL (ref 4.40–5.40)

## 2024-03-10 LAB — MAGNESIUM
MAGNESIUM: 2.9 mg/dL — ABNORMAL HIGH (ref 1.6–2.6)
MAGNESIUM: 2.5 mg/dL (ref 1.6–2.6)

## 2024-03-10 LAB — PHOSPHORUS
PHOSPHORUS: 1.9 mg/dL — ABNORMAL LOW (ref 2.4–5.1)
PHOSPHORUS: 4.7 mg/dL (ref 2.4–5.1)

## 2024-03-10 MED ADMIN — ondansetron (ZOFRAN) injection 4 mg: 4 mg | INTRAVENOUS | @ 19:00:00

## 2024-03-10 MED ADMIN — heparin (porcine) 1000 unit/mL injection 2,000 Units: 2000 [IU] | INTRAVENOUS | @ 10:00:00

## 2024-03-10 MED ADMIN — acetaminophen (TYLENOL) tablet 650 mg: 650 mg | GASTROENTERAL | @ 19:00:00

## 2024-03-10 MED ADMIN — sodium chloride (NS) 0.9 % flush 10 mL: 10 mL | INTRAVENOUS | @ 02:00:00

## 2024-03-10 MED ADMIN — bumetanide (BUMEX) injection 4 mg: 4 mg | INTRAVENOUS | @ 20:00:00 | Stop: 2024-03-10

## 2024-03-10 MED ADMIN — potassium phosphate 30 mmol in sodium chloride (NS) 0.9 % 250 mL infusion: 30 mmol | INTRAVENOUS | @ 13:00:00 | Stop: 2024-03-10

## 2024-03-10 MED ADMIN — iohexol (OMNIPAQUE) 240 mg iodine/mL oral solution 50 mL: 50 mL | ORAL | @ 18:00:00 | Stop: 2024-03-10

## 2024-03-10 MED ADMIN — bumetanide (BUMEX) injection 4 mg: 4 mg | INTRAVENOUS | @ 15:00:00 | Stop: 2024-03-10

## 2024-03-10 MED ADMIN — insulin regular (HumuLIN,NovoLIN) injection 0-20 Units: 0-20 [IU] | SUBCUTANEOUS | @ 22:00:00

## 2024-03-10 MED ADMIN — acetaminophen (TYLENOL) tablet 650 mg: 650 mg | GASTROENTERAL | @ 08:00:00

## 2024-03-10 MED ADMIN — NxStage/multiBic RFP 401 (+/- BB) 5000 mL - contains 4 mEq/L of potassium dialysis solution 5,000 mL: 5000 mL | INTRAVENOUS_CENTRAL | @ 02:00:00

## 2024-03-10 MED ADMIN — sodium chloride (NS) 0.9 % flush 10 mL: 10 mL | INTRAVENOUS | @ 10:00:00

## 2024-03-10 MED ADMIN — insulin regular (HumuLIN,NovoLIN) injection 0-20 Units: 0-20 [IU] | SUBCUTANEOUS | @ 10:00:00

## 2024-03-10 MED ADMIN — atorvastatin (LIPITOR) tablet 40 mg: 40 mg | GASTROENTERAL | @ 13:00:00

## 2024-03-10 MED ADMIN — magnesium sulfate in D5W 1 gram/100 mL infusion 1 g: 1 g | INTRAVENOUS | @ 07:00:00 | Stop: 2024-03-10

## 2024-03-10 MED ADMIN — ipratropium (ATROVENT) 0.02 % nebulizer solution 500 mcg: 500 ug | RESPIRATORY_TRACT | @ 08:00:00 | Stop: 2024-03-10

## 2024-03-10 MED ADMIN — insulin regular (HumuLIN,NovoLIN) injection 0-20 Units: 0-20 [IU] | SUBCUTANEOUS | @ 05:00:00

## 2024-03-10 MED ADMIN — budesonide (PULMICORT) nebulizer solution 0.5 mg: .5 mg | RESPIRATORY_TRACT | @ 01:00:00

## 2024-03-10 MED ADMIN — meropenem (MERREM) 500 mg in sodium chloride 0.9 % (NS) 100 mL IVPB-MBP: 500 mg | INTRAVENOUS | @ 05:00:00 | Stop: 2024-03-12

## 2024-03-10 MED ADMIN — acetaminophen (TYLENOL) tablet 650 mg: 650 mg | GASTROENTERAL | @ 02:00:00

## 2024-03-10 MED ADMIN — meropenem (MERREM) 500 mg in sodium chloride 0.9 % (NS) 100 mL IVPB-MBP: 500 mg | INTRAVENOUS | @ 13:00:00 | Stop: 2024-03-12

## 2024-03-10 MED ADMIN — ipratropium (ATROVENT) 0.02 % nebulizer solution 500 mcg: 500 ug | RESPIRATORY_TRACT | @ 01:00:00

## 2024-03-10 MED ADMIN — budesonide (PULMICORT) nebulizer solution 0.5 mg: .5 mg | RESPIRATORY_TRACT | @ 14:00:00 | Stop: 2024-03-10

## 2024-03-10 MED ADMIN — sodium chloride (NS) 0.9 % flush 10 mL: 10 mL | INTRAVENOUS | @ 18:00:00

## 2024-03-10 MED ADMIN — micafungin (MYCAMINE) 100 mg in sodium chloride 0.9 % (NS) 100 mL IVPB-MBP: 100 mg | INTRAVENOUS | @ 08:00:00 | Stop: 2024-03-10

## 2024-03-10 MED ADMIN — aspirin chewable tablet 81 mg: 81 mg | GASTROENTERAL | @ 13:00:00

## 2024-03-10 MED ADMIN — ipratropium (ATROVENT) 0.02 % nebulizer solution 500 mcg: 500 ug | RESPIRATORY_TRACT | @ 14:00:00 | Stop: 2024-03-10

## 2024-03-10 MED ADMIN — insulin NPH (HumuLIN,NovoLIN) injection 10 Units: 10 [IU] | SUBCUTANEOUS | @ 13:00:00 | Stop: 2024-03-10

## 2024-03-10 MED ADMIN — heparin (porcine) 1000 unit/mL injection 1,200 Units: 1200 [IU] | INTRAVENOUS | @ 05:00:00

## 2024-03-10 MED ADMIN — meropenem (MERREM) 500 mg in sodium chloride 0.9 % (NS) 100 mL IVPB-MBP: 500 mg | INTRAVENOUS | @ 20:00:00 | Stop: 2024-03-12

## 2024-03-10 MED ADMIN — NORepinephrine 8 mg in dextrose 5 % 250 mL (32 mcg/mL) infusion PMB: 0-10 ug/min | INTRAVENOUS | @ 10:00:00 | Stop: 2024-03-10

## 2024-03-10 MED ADMIN — amiodarone (PACERONE) oral suspension: 200 mg | GASTROENTERAL | @ 13:00:00

## 2024-03-10 MED ADMIN — heparin 25,000 Units/250 mL (100 units/mL) in 0.45% saline infusion (premade): 0-24 [IU]/kg/h | INTRAVENOUS | @ 10:00:00

## 2024-03-10 MED ADMIN — magnesium sulfate in D5W 1 gram/100 mL infusion 1 g: 1 g | INTRAVENOUS | @ 05:00:00 | Stop: 2024-03-10

## 2024-03-10 MED ADMIN — insulin NPH (HumuLIN,NovoLIN) injection 7 Units: 7 [IU] | SUBCUTANEOUS | @ 02:00:00

## 2024-03-10 MED ADMIN — magnesium sulfate in D5W 1 gram/100 mL infusion 1 g: 1 g | INTRAVENOUS | @ 06:00:00 | Stop: 2024-03-10

## 2024-03-10 MED ADMIN — acetaminophen (TYLENOL) tablet 650 mg: 650 mg | GASTROENTERAL | @ 13:00:00

## 2024-03-10 NOTE — Unmapped (Signed)
 1200 nephrology fellow and primary STICU team at bedside speaking with patient and family regarding current status, ct scan and contrast concerns, etc. Plan for ct with oral (NGT) contrast only.  12:38- aptt/ hep core 0.3 reviewed. No changes per orders. Hep gtt to continue at current rate with repeat labs in am.   1200- laying flat trial completed for ordered CT and ptient tolerated for >73min without complications  1410- started oral contrast slowly via ngt. Ct and icu transport notified  1330- contrast given, fairly tolerated. Patient c/o slight nausea. Zofran given. Denies pain. CT, icu transport nurse notified of stop time contrast via NGT. delivered  CT scheduled for 1630.  1620: patient layed flat with primary RN and RT at bedside with nonrebreather ready for transport to CT. Patient layed flat and vommited small amount gastric content. Immediately sat up about 90 degrees in bed with assist. Primary team MD notified face to face at bedside. Second episode of small-medium sized emesis suctioned orally despite patient denying feeling need to vommit. NGT hooked to continuous suction at this time. MD and ct notified. Plan to forgo scan at this time d/t airway safety concern and risk for aspiration. bright yellow/green gastric contents immediately returned to cannister when hooked to suction.  Pt placed back on NFNC and increased o2 requirements of 35% 60L. Holding ct as ordered.  1830- MD Oliver Hum notified of output from NGT 1100. (+300 output in addition to given of contrast). Clarification regarding concern for TF restart. Per MD, hold feeds for now, treat Blood Sugars with sliding scale and repeat POC glucose after shift change. KUB. See orders.   PTOT worked with patient in am and sat up at side of bed.     Patient A+O neuro status unchanged. Tolerating oxygenation on HFNC at this time 35% 60L. Cough Deep breathing and IS encouraged. Pt has weak-mod cough. HOB> 45 degrees. Neph and STICU teams aware of oliguria despite bumex. Labs reviewed. No BM today. Remains on heparin gtt at this time therapeutic. Repeat in am. Norepinephrine discontinued this afternoon. Will notify if mAP <65. Afebrile.     Problem: Adult Inpatient Plan of Care  Goal: Plan of Care Review  Outcome: Ongoing - Unchanged  Goal: Patient-Specific Goal (Individualized)  Outcome: Ongoing - Unchanged  Goal: Absence of Hospital-Acquired Illness or Injury  Outcome: Ongoing - Unchanged  Intervention: Identify and Manage Fall Risk  Recent Flowsheet Documentation  Taken 03/10/2024 0800 by Henderson Baltimore, RN  Safety Interventions:   aspiration precautions   bed alarm   environmental modification   fall reduction program maintained   enteral feeding safety   lighting adjusted for tasks/safety   low bed   bleeding precautions   commode/urinal/bedpan at bedside   family at bedside   infection management   muscle strengthening facilitated  Intervention: Prevent Skin Injury  Recent Flowsheet Documentation  Taken 03/10/2024 1600 by Henderson Baltimore, RN  Positioning for Skin: Left  Device Skin Pressure Protection:   absorbent pad utilized/changed   positioning supports utilized   pressure points protected   skin-to-device areas padded   skin-to-skin areas padded   tubing/devices free from skin contact  Skin Protection:   skin-to-skin areas padded   transparent dressing maintained   zinc oxide barrier cream   tubing/devices free from skin contact  Taken 03/10/2024 1400 by Henderson Baltimore, RN  Positioning for Skin: Supine/Back  Device Skin Pressure Protection:   absorbent pad utilized/changed   positioning supports utilized  pressure points protected   skin-to-device areas padded   skin-to-skin areas padded   tubing/devices free from skin contact  Skin Protection:   pulse oximeter probe site changed   silicone foam dressing in place   skin sealant/moisture barrier applied   skin-to-device areas padded   skin-to-skin areas padded   transparent dressing maintained   tubing/devices free from skin contact  Taken 03/10/2024 1200 by Henderson Baltimore, RN  Positioning for Skin: Right  Device Skin Pressure Protection:   absorbent pad utilized/changed   pressure points protected   skin-to-device areas padded   positioning supports utilized   skin-to-skin areas padded   tubing/devices free from skin contact  Skin Protection:   skin sealant/moisture barrier applied   silicone foam dressing in place   skin-to-device areas padded   skin-to-skin areas padded   tubing/devices free from skin contact  Taken 03/10/2024 1000 by Henderson Baltimore, RN  Positioning for Skin: Left  Device Skin Pressure Protection:   absorbent pad utilized/changed   positioning supports utilized   skin-to-device areas padded   tubing/devices free from skin contact   skin-to-skin areas padded   pressure points protected  Skin Protection:   incontinence pads utilized   pulse oximeter probe site changed   silicone foam dressing in place   skin-to-device areas padded   skin-to-skin areas padded   transparent dressing maintained   tubing/devices free from skin contact   skin sealant/moisture barrier applied  Taken 03/10/2024 0800 by Henderson Baltimore, RN  Positioning for Skin: Supine/Back  Device Skin Pressure Protection:   absorbent pad utilized/changed   positioning supports utilized   skin-to-device areas padded   pressure points protected   skin-to-skin areas padded   tubing/devices free from skin contact  Skin Protection:   silicone foam dressing in place   skin sealant/moisture barrier applied   skin-to-device areas padded   skin-to-skin areas padded   transparent dressing maintained   tubing/devices free from skin contact  Intervention: Prevent and Manage VTE (Venous Thromboembolism) Risk  Recent Flowsheet Documentation  Taken 03/10/2024 1600 by Henderson Baltimore, RN  Anti-Embolism Device Type: SCD, Knee  Anti-Embolism Device Status: On  Anti-Embolism Device Location: BLE  Taken 03/10/2024 1400 by Henderson Baltimore, RN  Anti-Embolism Device Type: SCD, Knee  Anti-Embolism Device Status: On  Anti-Embolism Device Location: BLE  Taken 03/10/2024 1200 by Henderson Baltimore, RN  Anti-Embolism Device Type: SCD, Knee  Anti-Embolism Device Status: On  Anti-Embolism Device Location: BLE  Taken 03/10/2024 1000 by Henderson Baltimore, RN  Anti-Embolism Device Type: SCD, Knee  Anti-Embolism Device Status: On  Anti-Embolism Device Location: BLE  Taken 03/10/2024 0800 by Henderson Baltimore, RN  Anti-Embolism Device Type: SCD, Knee  Anti-Embolism Device Status: On  Anti-Embolism Device Location: BLE  Intervention: Prevent Infection  Recent Flowsheet Documentation  Taken 03/10/2024 0800 by Henderson Baltimore, RN  Infection Prevention:   cohorting utilized   environmental surveillance performed   equipment surfaces disinfected   hand hygiene promoted   personal protective equipment utilized   rest/sleep promoted  Goal: Optimal Comfort and Wellbeing  Outcome: Ongoing - Unchanged  Goal: Readiness for Transition of Care  Outcome: Ongoing - Unchanged  Goal: Rounds/Family Conference  Outcome: Ongoing - Unchanged

## 2024-03-10 NOTE — Unmapped (Signed)
 PHYSICAL THERAPY  Evaluation (03/10/24 1019)          Patient Name:  Tammy Braun       Medical Record Number: [id number]   Date of Birth: 1941/10/16  Sex: Female        Post-Discharge Physical Therapy Recommendations:  PT Post Acute Discharge Recommendations: 5x weekly, Low intensity, Skilled PT services indicated   Equipment Recommendation  PT DME Recommendations: Defer to post acute                   Activity Tolerance: Limited by fatigue     ASSESSMENT  Problem List: Decreased mobility, Decreased strength, Decreased endurance, Impaired sensation, Decreased safety awareness, Decreased range of motion, Gait deviation, Impaired balance, Fall risk, Decreased cognition      Assessment : Pt is a 83 yo F admitted with abdominal pain. Found to have SBO. Now s/p surgery. Pt required max assist x2 for EOB mobility. Recommending 5x/week low intensity PT services upon discharge.      Today's Interventions: Pt tolerated sitting EOB ~15 minutes total with max to CGA. Pt with posterior and R lean. Verbal and tactile cues needed to maintain sitting balance.     Personal Factors/Comorbidities Present: 3+ (bladder cancer, COPD, afib)   Examination of Body System: Musculoskeletal, Cardiovascular, Neurological       Clinical Decision Making: Moderate        PLAN  Planned Frequency of Treatment: Plan of Care Initiated: 03/24/24  1-2x per day Weekly Frequency: 3-4 days per week  Planned Treatment Duration: 03/24/24     Planned Interventions: Education (Patient/Family/Caregiver), Self-care / Home Management training, Gait training, Therapeutic Exercise, Therapeutic Activity     Goals:   Patient and Family Goals: Pt really wanting to get out of bed to chair.     SHORT GOAL #1: Pt will perform supine to/from sit transfer with min assist.               Time Frame : 2 weeks  SHORT GOAL #2: Pt will sit for 10 minutes with SBA.              Time Frame : 2 weeks  SHORT GOAL #3: Pt will participate in a standing assessment. Time Frame : 2 weeks                       Long Term Goal #1: TBD with further mobility.  Time Frame: 4 weeks     Prognosis:  Fair  Positive Indicators: PLOF, family support  Barriers to Discharge: Functional strength deficits, Decreased range of motion, Decreased safety awareness, Endurance deficits, Impaired Balance, Gait instability, Cognitive deficits, Inaccessible home environment, Poor insight into deficits     SUBJECTIVE        Patient reports: I want you to bring me my stick. Pt very motivated to get out of bed. Daughter present during session.  Pain Comments: Pt denies pain.        Prior Functional Status: Per daughter, pt ambulates with RW in the home, cane on the stairs, and transport w/c when out in community.  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    Chronic kidney disease     Depression, psychotic (CMS-HCC)     Diabetes mellitus (CMS-HCC)     in past    Emphysema of lung (CMS-HCC)     Financial difficulties     Frail elderly     Hearing impairment     Hernia     History of transfusion     Hyperlipidemia     Hypertension     Impaired mobility     Osteoporosis     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/08/2015    Visual impairment             Social History     Tobacco Use    Smoking status: Every Day     Current packs/day: 1.00     Average packs/day: 1 pack/day for 65.9 years (65.9 ttl pk-yrs)     Types: Cigarettes     Start date: 04/09/1958    Smokeless tobacco: Never    Tobacco comments:     15cpd   Substance Use Topics    Alcohol use: No     Alcohol/week: 0.0 standard drinks of alcohol       Past Surgical History:   Procedure Laterality Date    ABDOMINAL SURGERY      BLADDER SURGERY      BREAST CYST EXCISION      CHEMOTHERAPY  2012    bladder    GALLBLADDER SURGERY      stone removal    ILEOSTOMY  2012    PR COLONOSCOPY FLX DX W/COLLJ SPEC WHEN PFRMD N/A 02/09/2015    Procedure: COLONOSCOPY, FLEXIBLE, PROXIMAL TO SPLENIC FLEXURE; DIAGNOSTIC, W/WO COLLECTION SPECIMEN BY BRUSH OR WASH;  Surgeon: Dewaine Conger, MD;  Location: [facility] [place];  Service: Gastroenterology    PR EXPLORATORY OF ABDOMEN N/A 02/28/2024    Procedure: EXPLORATORY LAPAROTOMY, EXPLORATORY CELIOTOMY WITH OR WITHOUT BIOPSY(S);  Surgeon: Renda Rolls, MD;  Location: [facility];  Service: General Surgery    PR RECONSTR TOTAL SHOULDER IMPLANT Left 04/08/2018    Procedure: ARTHROPLASTY, GLENOHUMERAL JOINT; TOTAL SHOULDER(GLENOID & PROXIMAL HUMERAL REPLACEMENT(EG, TOTAL SHOULDER);  Surgeon: Tomasa Rand, MD;  Location[facility];  Service: Ortho Sports Medicine    PR REOPEN RECENT ABD EXPLORATORY Midline 03/01/2024    Procedure: REOPENING OF RECENT LAPAROTOMY;  Surgeon: [name]., MD;  Location: OR [facility];  Service: Trauma    PR REOPEN RECENT ABD EXPLORATORY N/A 03/03/2024    Procedure: REOPENING OF RECENT LAPAROTOMY;  Surgeon: Joanie Coddington, MD;  Location: OR [facility];  Service: Trauma    PR SIGMOIDOSCOPY,BIOPSY N/A 03/11/2015    Procedure: SIGMOIDOSCOPY, FLEXIBLE; WITH BIOPSY, SINGLE OR MULTIPLE;  Surgeon: Wilburt Finlay, MD;  [facility];  Service: Gastroenterology    PR XCAPSL CTRC RMVL INSJ IO LENS PROSTH W/O ECP Left 06/05/2022    Procedure: EXTRACAPSULAR CATARACT REMOVAL W/INSERTION OF INTRAOCULAR LENS PROSTHESIS, MANUAL OR MECHANICAL TECHNIQUE WITHOUT ENDOSCOPIC CYCLOPHOTOCOAGULATION;  Surgeon: Garner Gavel, MD;  Location: [facility];  Service: Ophthalmology    PR XCAPSL CTRC RMVL INSJ IO LENS PROSTH W/O ECP Right 06/19/2022    Procedure: EXTRACAPSULAR CATARACT REMOVAL W/INSERTION OF INTRAOCULAR LENS PROSTHESIS, MANUAL OR MECHANICAL TECHNIQUE WITHOUT ENDOSCOPIC CYCLOPHOTOCOAGULATION;  Surgeon: Garner Gavel, MD;  [facility]: [facility];  Service: Ophthalmology  Family History   Problem Relation Age of Onset    Breast cancer Daughter 30    Diabetes Mother     Glaucoma Father     Colon cancer Neg Hx     Ovarian cancer Neg Hx     Endometrial cancer Neg Hx     Anesthesia problems Neg Hx     Bleeding Disorder Neg Hx         Allergies: Lisinopril, Losartan, and Hctz [hydrochlorothiazide]                  Objective Findings  Precautions / Restrictions  Precautions: Falls precautions, Other precautions (post op abdominal precautions.)  Weight Bearing Status: Non-applicable  Required Braces or Orthoses: Non-applicable     Medical Tests / Procedures: 2/28 intubation, ex lap, left open, 3/2 reopening, 3/4 abdominal wound vac, PE found on imaging, 3/8 CRRT  Equipment / Environment: Vascular access (PIV, TLC, Port-a-cath, PICC), NGT, Telemetry, Foley, Arterial line, Colostomy, JP drain(s), Supplemental oxygen (HFNC 40L, 41%.)     Vitals/Orthostatics : HR 62, SpO2 97, ABP 114/48, RR 23     Living Situation  Living Environment: House  Lives With: Daughter (42 yo grandson)  Home Living: One level home, Stairs to enter with rails, Tub/shower unit, Shower chair with back, Grab bars in shower, Hand-held shower hose, Handicapped height toilet  Rail placement (outside): Bilateral rails in reach  Number of Stairs to Enter (outside): 4  Caregiver Availability: 24 hours  Caregiver Ability: Limited lifting      Cognition comment: Pt oriented to self. Repetitive about getting her stick (cane) to get out of bed.     Hearing: Hearing impairment  Hearing: Daughter states pt is hard of hearing.     Skin Inspection: Intact where visualized     Upper Extremities  UE ROM: Left Impaired/Limited, Right WFL  UE Strength: Left Impaired/Limited, Right Impaired/Limited  RUE Strength Impairment: Reduced strength  LUE Strength Impairment: Reduced strength  UE comment: L shoulder flexion limited by previous surgery. BUE grossly 2/5.    Lower Extremities  LE ROM: Right WFL, Left WFL  LE Strength: Right Impaired/Limited, Left Impaired/Limited  RLE Strength Impairment: Reduced strength  LLE Strength Impairment: Reduced strength  LE comment: BLE 2/5 based on observation. (-) SLR BLE.          Bed Mobility comments: Supine to/from sit with max assist x2.           Patient at end of session: All needs in reach, Friends/Family present, In bed, Lines intact, Notified Nurse    Physical Therapy Session Duration  PT Co-Treatment [mins]: 36 (with OT Neale Burly)  Reason for Co-treatment: Requires heavy assist for safety, To safely progress mobility, Poor activity tolerance          AM-PAC 5 click  Help currently need turning over In bed?: A lot - Maximum/Moderate Assistance  Help currently needed sitting down/standing up from chair with arms? : Unable to do/total assistance - Total Dependent Assist  Help currently needed moving from supine to sitting on edge of bed?: A lot - Maximum/Moderate Assistance  Help currently needed moving to and from bed from wheelchair?: Unable to do/total assistance - Total Dependent Assist  Help currently needed walking in a hospital room?: Unable to do/total assistance - Total Dependent Assist      Basic Mobility Score 5 click: 7    Score (in points): % of Functional Impairment, Limitation, Restriction  5: 100% impaired, limited, restricted  6-7: At  least 80%, but less than 100% impaired, limited restricted  8-11: At least 60%, but less than 80% impaired, limited restricted  12-16: At least 40%, but less than 60% impaired, limited restricted  17-18: At least 20%, but less than 40% impaired, limited restricted  19: At least 1%, but less than 20% impaired, limited restricted  20: 0% impaired, limited restricted    'AM-PAC' forms are Copyright protected by [employer] [facility]         I attest that I have reviewed the above information.  Signed: Janetta Hora, PT  Filed 03/10/2024

## 2024-03-10 NOTE — Unmapped (Signed)
 Patient alert and oriented x2-4, follows commands in all extremities, no complaints of pain. Afib, BP within parameters with levo, afebrile. On HFNC, tolerating well, strong/productive cough, patient suctions self. NG in place and infusing TF at goal, no BM during shift, ostomy pouch in place over ileal conduit, diminished UOP. CRRT taken down after filter expired, no restart per MD/NP, dialysis lumens heparin locked. See flowsheets for more output information.     Problem: Adult Inpatient Plan of Care  Goal: Plan of Care Review  Outcome: Ongoing - Unchanged  Goal: Patient-Specific Goal (Individualized)  Outcome: Ongoing - Unchanged  Goal: Absence of Hospital-Acquired Illness or Injury  Outcome: Ongoing - Unchanged  Intervention: Identify and Manage Fall Risk  Recent Flowsheet Documentation  Taken 03/09/2024 2000 by Aretta Nip, Marvia Pickles, RN  Safety Interventions:   aspiration precautions   bed alarm   bleeding precautions   environmental modification   fall reduction program maintained   infection management   lighting adjusted for tasks/safety   low bed   enteral feeding safety  Intervention: Prevent Skin Injury  Recent Flowsheet Documentation  Taken 03/10/2024 0600 by Aretta Nip, Marvia Pickles, RN  Positioning for Skin: Right  Taken 03/10/2024 0400 by Aretta Nip, Marvia Pickles, RN  Positioning for Skin: Left  Taken 03/10/2024 0200 by Aretta Nip, Marvia Pickles, RN  Positioning for Skin: Right  Taken 03/10/2024 0000 by Maurine Minister, RN  Positioning for Skin: Left  Taken 03/09/2024 2200 by Maurine Minister, RN  Positioning for Skin: Right  Taken 03/09/2024 2000 by Aretta Nip, Marvia Pickles, RN  Positioning for Skin: Left  Device Skin Pressure Protection:   adhesive use limited   absorbent pad utilized/changed   positioning supports utilized   pressure points protected   skin-to-device areas padded   skin-to-skin areas padded   tubing/devices free from skin contact  Skin Protection:   adhesive use limited   cleansing with dimethicone incontinence wipes   incontinence pads utilized   silicone foam dressing in place   skin sealant/moisture barrier applied   skin-to-device areas padded   skin-to-skin areas padded   transparent dressing maintained   tubing/devices free from skin contact  Intervention: Prevent and Manage VTE (Venous Thromboembolism) Risk  Recent Flowsheet Documentation  Taken 03/10/2024 0600 by Aretta Nip, Marvia Pickles, RN  Anti-Embolism Device Type: SCD, Knee  Anti-Embolism Device Status: Refused  Anti-Embolism Device Location: BLE  Taken 03/10/2024 0400 by Aretta Nip, Marvia Pickles, RN  Anti-Embolism Device Type: SCD, Knee  Anti-Embolism Device Status: Refused  Anti-Embolism Device Location: BLE  Taken 03/10/2024 0200 by Aretta Nip, Marvia Pickles, RN  Anti-Embolism Device Type: SCD, Knee  Anti-Embolism Device Status: Refused  Anti-Embolism Device Location: BLE  Taken 03/10/2024 0000 by Aretta Nip, Marvia Pickles, RN  Anti-Embolism Device Type: SCD, Knee  Anti-Embolism Device Status: Refused  Anti-Embolism Device Location: BLE  Taken 03/09/2024 2200 by Aretta Nip, Laurena Valko, RN  Anti-Embolism Device Type: SCD, Knee  Anti-Embolism Device Status: Refused  Anti-Embolism Device Location: BLE  Taken 03/09/2024 2000 by Aretta Nip, Marvia Pickles, RN  Anti-Embolism Device Type: SCD, Knee  Anti-Embolism Device Status: Refused  Anti-Embolism Device Location: BLE  Intervention: Prevent Infection  Recent Flowsheet Documentation  Taken 03/09/2024 2000 by Maurine Minister, RN  Infection Prevention:   cohorting utilized   equipment surfaces disinfected   environmental surveillance performed   hand hygiene promoted   personal protective equipment utilized   rest/sleep promoted   single patient room provided   visitors restricted/screened  Goal: Optimal Comfort and Wellbeing  Outcome: Ongoing -  Unchanged  Goal: Readiness for Transition of Care  Outcome: Ongoing - Unchanged  Goal: Rounds/Family Conference  Outcome: Ongoing - Unchanged     Problem: Infection  Goal: Absence of Infection Signs and Symptoms  Outcome: Ongoing - Unchanged  Intervention: Prevent or Manage Infection  Recent Flowsheet Documentation  Taken 03/09/2024 2000 by Aretta Nip, Marvia Pickles, RN  Infection Management: aseptic technique maintained     Problem: Fall Injury Risk  Goal: Absence of Fall and Fall-Related Injury  Outcome: Ongoing - Unchanged  Intervention: Identify and Manage Contributors  Recent Flowsheet Documentation  Taken 03/09/2024 2000 by Aretta Nip, Marvia Pickles, RN  Self-Care Promotion: independence encouraged  Intervention: Promote Injury-Free Environment  Recent Flowsheet Documentation  Taken 03/09/2024 2000 by Aretta Nip, Marvia Pickles, RN  Safety Interventions:   aspiration precautions   bed alarm   bleeding precautions   environmental modification   fall reduction program maintained   infection management   lighting adjusted for tasks/safety   low bed   enteral feeding safety     Problem: Wound  Goal: Optimal Coping  Outcome: Ongoing - Unchanged  Goal: Optimal Functional Ability  Outcome: Ongoing - Unchanged  Intervention: Optimize Functional Ability  Recent Flowsheet Documentation  Taken 03/09/2024 2000 by Aretta Nip, Marvia Pickles, RN  Activity Management: bedrest  Goal: Absence of Infection Signs and Symptoms  Outcome: Ongoing - Unchanged  Intervention: Prevent or Manage Infection  Recent Flowsheet Documentation  Taken 03/09/2024 2000 by Aretta Nip, Marvia Pickles, RN  Infection Management: aseptic technique maintained  Goal: Improved Oral Intake  Outcome: Ongoing - Unchanged  Goal: Optimal Pain Control and Function  Outcome: Ongoing - Unchanged  Goal: Skin Health and Integrity  Outcome: Ongoing - Unchanged  Intervention: Optimize Skin Protection  Recent Flowsheet Documentation  Taken 03/10/2024 0600 by Aretta Nip, Marvia Pickles, RN  Head of Bed [facility]) Positioning: HOB at 30-45 degrees  Taken 03/10/2024 0400 by Aretta Nip, Marvia Pickles, RN  Head of Bed [facility]) Positioning: HOB at 30-45 degrees  Taken 03/10/2024 0200 by Maurine Minister, RN  Head of Bed [facility]) Positioning: HOB at 30-45 degrees  Taken 03/10/2024 0000 by Aretta Nip, Marvia Pickles, RN  Head of Bed [facility]) Positioning: HOB at 30-45 degrees  Taken 03/09/2024 2200 by Aretta Nip, Marvia Pickles, RN  Head of Bed [facility]) Positioning: HOB at 30-45 degrees  Taken 03/09/2024 2000 by Aretta Nip, Marvia Pickles, RN  Activity Management: bedrest  Pressure Reduction Techniques:   frequent weight shift encouraged   heels elevated off bed   positioned off wounds   pressure points protected   weight shift assistance provided  Pressure Reduction Devices:   specialty bed utilized   pressure-redistributing mattress utilized   positioning supports utilized   heel offloading device utilized   foam padding utilized  Skin Protection:   adhesive use limited   cleansing with dimethicone incontinence wipes   incontinence pads utilized   silicone foam dressing in place   skin sealant/moisture barrier applied   skin-to-device areas padded   skin-to-skin areas padded   transparent dressing maintained   tubing/devices free from skin contact  Goal: Optimal Wound Healing  Outcome: Ongoing - Unchanged     Problem: Skin Injury Risk Increased  Goal: Skin Health and Integrity  Outcome: Ongoing - Unchanged  Intervention: Optimize Skin Protection  Recent Flowsheet Documentation  Taken 03/10/2024 0600 by Aretta Nip, Marvia Pickles, RN  Head of Bed [facility]) Positioning: HOB at 30-45 degrees  Taken 03/10/2024 0400 by Maurine Minister, RN  Head of Bed [facility]) Positioning: HOB at  30-45 degrees  Taken 03/10/2024 0200 by Aretta Nip, Marvia Pickles, RN  Head of Bed [facility]) Positioning: HOB at 30-45 degrees  Taken 03/10/2024 0000 by Aretta Nip, Marvia Pickles, RN  Head of Bed [facility]) Positioning: HOB at 30-45 degrees  Taken 03/09/2024 2200 by Aretta Nip, Marvia Pickles, RN  Head of Bed [facility]) Positioning: HOB at 30-45 degrees  Taken 03/09/2024 2000 by Aretta Nip, Marvia Pickles, RN  Activity Management: bedrest  Pressure Reduction Techniques:   frequent weight shift encouraged heels elevated off bed   positioned off wounds   pressure points protected   weight shift assistance provided  Pressure Reduction Devices:   specialty bed utilized   pressure-redistributing mattress utilized   positioning supports utilized   heel offloading device utilized   foam padding utilized  Skin Protection:   adhesive use limited   cleansing with dimethicone incontinence wipes   incontinence pads utilized   silicone foam dressing in place   skin sealant/moisture barrier applied   skin-to-device areas padded   skin-to-skin areas padded   transparent dressing maintained   tubing/devices free from skin contact     Problem: Self-Care Deficit  Goal: Improved Ability to Complete Activities of Daily Living  Outcome: Ongoing - Unchanged  Intervention: Promote Activity and Functional Independence  Recent Flowsheet Documentation  Taken 03/09/2024 2000 by Aretta Nip, Marvia Pickles, RN  Self-Care Promotion: independence encouraged     Problem: Mechanical Ventilation Invasive  Goal: Effective Communication  Outcome: Ongoing - Unchanged  Goal: Optimal Device Function  Outcome: Ongoing - Unchanged  Intervention: Optimize Device Care and Function  Recent Flowsheet Documentation  Taken 03/10/2024 0600 by Aretta Nip, Marvia Pickles, RN  Oral Care: suction provided  Taken 03/10/2024 0400 by Maurine Minister, RN  Oral Care:   mouth swabbed   oral rinse provided   suction provided  Taken 03/10/2024 0200 by Maurine Minister, RN  Oral Care: suction provided  Taken 03/10/2024 0000 by Maurine Minister, RN  Oral Care:   mouth swabbed   oral rinse provided   suction provided  Taken 03/09/2024 2200 by Maurine Minister, RN  Oral Care: suction provided  Taken 03/09/2024 2000 by Aretta Nip, Marvia Pickles, RN  Airway/Ventilation Management: airway patency maintained  Oral Care:   mouth swabbed   oral rinse provided   suction provided  Airway Safety Measures:   mask valve resuscitator at bedside   suction at bedside  Goal: Mechanical Ventilation Liberation  Outcome: Ongoing - Unchanged  Goal: Optimal Nutrition Delivery  Outcome: Ongoing - Unchanged  Goal: Absence of Device-Related Skin and Tissue Injury  Outcome: Ongoing - Unchanged  Intervention: Maintain Skin and Tissue Health  Recent Flowsheet Documentation  Taken 03/09/2024 2000 by Aretta Nip, Marvia Pickles, RN  Device Skin Pressure Protection:   adhesive use limited   absorbent pad utilized/changed   positioning supports utilized   pressure points protected   skin-to-device areas padded   skin-to-skin areas padded   tubing/devices free from skin contact  Goal: Absence of Ventilator-Induced Lung Injury  Outcome: Ongoing - Unchanged  Intervention: Prevent Ventilator-Associated Pneumonia  Recent Flowsheet Documentation  Taken 03/10/2024 0600 by Aretta Nip, Marvia Pickles, RN  Head of Bed [facility]) Positioning: HOB at 30-45 degrees  Oral Care: suction provided  Taken 03/10/2024 0400 by Maurine Minister, RN  Head of Bed [facility]) Positioning: HOB at 30-45 degrees  Oral Care:   mouth swabbed   oral rinse provided   suction provided  Taken 03/10/2024 0200 by Maurine Minister, RN  Head of Bed (  [facility]) Positioning: HOB at 30-45 degrees  Oral Care: suction provided  Taken 03/10/2024 0000 by Aretta Nip, Marvia Pickles, RN  Head of Bed [facility]) Positioning: HOB at 30-45 degrees  Oral Care:   mouth swabbed   oral rinse provided   suction provided  Taken 03/09/2024 2200 by Aretta Nip, Marvia Pickles, RN  Head of Bed [facility]) Positioning: HOB at 30-45 degrees  Oral Care: suction provided  Taken 03/09/2024 2000 by Aretta Nip, Marvia Pickles, RN  Oral Care:   mouth swabbed   oral rinse provided   suction provided     Problem: Artificial Airway  Goal: Effective Communication  Outcome: Ongoing - Unchanged  Goal: Optimal Device Function  Outcome: Ongoing - Unchanged  Intervention: Optimize Device Care and Function  Recent Flowsheet Documentation  Taken 03/10/2024 0600 by Aretta Nip, Marvia Pickles, RN  Oral Care: suction provided  Taken 03/10/2024 0400 by Maurine Minister, RN  Oral Care:   mouth swabbed   oral rinse provided   suction provided  Taken 03/10/2024 0200 by Maurine Minister, RN  Oral Care: suction provided  Taken 03/10/2024 0000 by Maurine Minister, RN  Oral Care:   mouth swabbed   oral rinse provided   suction provided  Taken 03/09/2024 2200 by Maurine Minister, RN  Oral Care: suction provided  Taken 03/09/2024 2000 by Aretta Nip, Marvia Pickles, RN  Airway/Ventilation Management: airway patency maintained  Aspiration Precautions:   upright posture maintained   tube feeding placement verified   respiratory status monitored   oral hygiene care promoted   NPO pending swallow screening/evaluation  Oral Care:   mouth swabbed   oral rinse provided   suction provided  Airway Safety Measures:   mask valve resuscitator at bedside   suction at bedside  Goal: Absence of Device-Related Skin or Tissue Injury  Outcome: Ongoing - Unchanged  Intervention: Maintain Skin and Tissue Health  Recent Flowsheet Documentation  Taken 03/09/2024 2000 by Aretta Nip, Marvia Pickles, RN  Device Skin Pressure Protection:   adhesive use limited   absorbent pad utilized/changed   positioning supports utilized   pressure points protected   skin-to-device areas padded   skin-to-skin areas padded   tubing/devices free from skin contact

## 2024-03-10 NOTE — Unmapped (Signed)
 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  - AKI from ATN 2/2 to volume depletion from vomiting + hypotension + CIN  - CRRT from 3/7-3/10  - remains anuric post-CRRT but currently without acute indications for HD  - recommendations:   - Bumex 4 mg BID x1 day & assess UOP   - continue supportive measures of MAPs >65, limiting further nephrotoxic medications & contrasted images (unless absolutely needed) & renally dosing medications for eGFR <15   - will continue to assess for RRT needs daily but expect she will need this in the coming days    # SBO s/p ex-lap   - Evaluation and management per primary team  - No changes to management from a nephrology standpoint at this time    RECOMMENDATIONS:   - diuretics as above  - no acute indication for RRT  - supportive measures  - We will continue to follow.     Moss Mc, DO  03/10/2024 5:49 PM     Medical decision-making for 03/10/24  Findings / Data     Patient has: []  acute illness w/systemic sxs  [mod]  []  two or more stable chronic illnesses [mod]  []  one chronic illness with acute exacerbation [mod]  []  acute complicated illness  [mod]  []  Undiagnosed new problem with uncertain prognosis  [mod] [x]  illness posing risk to life or bodily function (ex. 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 Probs At least 2:  Probs, Data, Risk   I reviewed: [x]  primary team note  []  consultant note(s)  []  external records [x]  chemistry results  [x]  CBC results  []  blood gas results  []  Other []  procedure/op note(s)   []  radiology report(s)  []  micro result(s)  []  w/ independent historian(s) Labs stable; anuric >=3 Data Review (2 of 3)    I independently interpreted: []  Urine Sediment  []  Renal US []  CXR Images  []  CT Images  []  Other []  EKG Tracing N/A Any     I discussed: []  Pathology results w/ QHPs(s) from other specialties  []  Procedural findings w/ QHPs(s) from other specialties []  Imaging w/ QHP(s) from other specialties  [x]  Treatment plan w/ QHP(s) from other specialties Plan discussed with primary team Any     Mgm't requires: []  Prescription drug(s)  [mod]  []  Kidney biopsy  [mod]  []  Central line placement  [mod] [x]  High risk medication use and/or intensive toxicity monitoring [high]  [x]  Renal replacement therapy [high]  []  High risk kidney biopsy  [high]  []  Escalation of care  [high]  []  High risk central line placement  [high] RRT: High risk of complications from RRT requiring intensive monitoring and IV Diuresis: check BMP/Mg  Risk      _____________________________________________________________________________________    Subjective/Interval Events:   - doing well without complaints; is asking when she can leave  - anuric post day 1 of CRRT stopping    Physical Exam:  Vitals:    03/10/24 1630 03/10/24 1645 03/10/24 1700 03/10/24 1731   BP:   102/61    Pulse: 68 68 63 62   Resp: (!) 31 21 20 21    Temp:       TempSrc:  SpO2: 95% 96% 100% 100%   Weight:       Height:         I/O this shift:  In: 1724.9 [I.V.:214.9; NG/GT:1310; IV Piggyback:200]  Out: 100 [Urine:40; Drains:60]    Intake/Output Summary (Last 24 hours) at 03/10/2024 1749  Last data filed at 03/10/2024 1648  Gross per 24 hour   Intake 3427.69 ml   Output 1063 ml   Net 2364.69 ml     Constitutional: well-appearing, no acute distress  Heart: RRR, no m/r/g  Lungs: rhonci noted bilaterally  Ext: 1+ edema

## 2024-03-10 NOTE — Unmapped (Signed)
 Speech Language Pathology Clinical Swallow Assessment  Evaluation (03/10/24 1024)    Patient Name:  Tammy Braun       Medical Record Number: [id number]   Date of Birth: 13-Jul-1941  Sex: Female            SLP Treatment Diagnosis: Dysphagia- Oropharyngeal     Activity Tolerance: Patient tolerated treatment well    Assessment  Patient seen for a clinical swallow evaluation s/p prolonged intubation (9 days), with extubation on 3/10. Patient encountered awake, alert, well-oriented, following commands, and with fluent language. Tolerating 60L 35% FiO2 on HFNC. Vocal quality was severely weak and wet, impacting speech intelligibility. Daughter present at the bedside, supportive with care. Patient declined a history of dysphagia, endorsed tolerating a regular diet at baseline. An oral mechanism exam was completed, patient edentulous with no other gross asymmetries noted. Per daughter, wears dentures though they currently do not fit. Observed patient with trials of thin liquid (ice chips and water via spoon). Oral phase was seemingly timely. Patient with increased wet vocal quality and immediate vs delayed, weak coughing following trials of thin liquid via spoon.     Given clinical presentation, prolonged intubation, high oxygen needs, and wet/weak vocal quality, suspect patient is at a high risk of aspiration. Recommend patient remain NPO with consideration for ice chips in moderation, with supervision, and after appropriate oral care. Role of SLP and recommendations communicated with patient and family who expressed understanding. SLP to follow up for ongoing PO readiness per plan of care, thank you.  Risk for Aspiration: Severe     Recommendations:  NPO, Consider ice chips PRN, Frequent Oral Care, Consider alternative nutrition      Follow-up Referral Recommendations : Skilled SLP services to treat dysphagia and address aspiration risk factors    Diet Liquids Recommendations: No Liquids    Diet Solids Recommendation: No Solids    Recommended Form of Medications: Feeding tube      Post Acute Discharge Recommendations  Post Acute SLP Discharge Recommendations: Skilled SLP services indicated, 5x weekly    Prognosis: Fair  Positive Indicators: motivation, participation, family support  Barriers to Discharge: Functional strength deficits, Decreased range of motion, Inability to safely perform ADLS, Decreased safety awareness, Endurance deficits, Impaired Balance, Gait instability     Plan of Care  SLP Daily Frequency: 1x per day, 2-3 days per week  Planned Treatment Duration : 04/10/24    Treatment Goals:  Short Term Goal 1: Patient to participate in ongoing clinical swallow evaluation to determine PO recommendations   Time Frame : 2 weeks     LTG 1: Patient to safely discharge to least restricive environment  Time Frame: 2 weeks    Planned Interventions: Dysphagia Intervention     Patient and Family Goal: to have some diet coke    Subjective  Medical Updates Since Last Visit/Relevant PMH Affecting Clinical Decision Making: Initial consult. Per chart: Tammy Braun is a 83 y.o. female with PMH bladder cancer with cystectomy and ileal conduit, COPD, recent dx of afib, moderate-severe TR, CAD (high calcification score), T2DM presented to [facility] with projectile vomiting and abdominal pain, found to have SBO with transition point at level of ileostomy on CT A/P. On initial presentation, labs remarkable for leukocytosis thought to be iso hemoconcentration, AKI on CKD, mildly elevated venous lactate improved with resuscitation. Admitted to [facility] for worsening hypoxia on HFNC requiring intubation in setting of hiatal hernia. OR on 02/28/24 for ex-lap, reduciton of parastomal hernia, small bowel  resection with primary anastomosis, lysis of adhesions, abdomen left open with ABThera in place.    Communication Preference: Verbal, Visual  Patient/Caregiver Reports: give me some ice  Pain: no s/s of pain    Allergies: Lisinopril, Losartan, and Hctz [hydrochlorothiazide]  Current Facility-Administered Medications   Medication Dose Route Frequency Provider Last Rate Last Admin    acetaminophen (TYLENOL) tablet 650 mg  650 mg Enteral tube: gastric Q6H Oliver Hum, MD   650 mg at 03/10/24 0906    albuterol 2.5 mg /3 mL (0.083 %) nebulizer solution 2.5 mg  2.5 mg Nebulization Q6H PRN Oliver Hum, MD   2.5 mg at 02/28/24 1322    amiodarone (PACERONE) oral suspension  200 mg Enteral tube: gastric Daily Kodali, Resha A, MD   200 mg at 03/10/24 9147    aspirin chewable tablet 81 mg  81 mg Enteral tube: gastric Daily D'Errico, Dorita Fray, PA   81 mg at 03/10/24 0906    atorvastatin (LIPITOR) tablet 40 mg  40 mg Enteral tube: gastric Daily D'Errico, Dorita Fray, PA   40 mg at 03/10/24 0906    budesonide (PULMICORT) nebulizer solution 0.5 mg  0.5 mg Nebulization BID (RT) Oliver Hum, MD   0.5 mg at 03/10/24 1004    bumetanide (BUMEX) injection 4 mg  4 mg Intravenous Once Klauber, Hildred Laser, PA        [Provider Hold] cyclobenzaprine (FLEXERIL) tablet 5 mg  5 mg Oral BID PRN Oletha Blend B, MD        dextrose (D10W) 10% bolus 125 mL  12.5 g Intravenous Q10 Min PRN Oliver Hum, MD        flu vaccine [id number] [phone] UP)(PF)(FLUZONE HIGH DOSE)  0.5 mL Intramuscular During hospitalization Oliver Hum, MD        Melene Muller ON 03/14/2024] fluPHENAZine decanoate (PROLIXIN) injection 50 mg  50 mg Intramuscular Q30 Days Oliver Hum, MD        heparin (porcine) 1000 unit/mL injection 1,200 Units  1,200 Units Intravenous Q1H PRN Lala Lund, ACNP   1,200 Units at 03/10/24 0120    heparin (porcine) 1000 unit/mL injection 2,000 Units  2,000 Units Intravenous Q6H PRN Oliver Hum, MD   2,000 Units at 03/10/24 0600    heparin 25,000 Units/250 mL (100 units/mL) in 0.45% saline infusion (premade)  0-24 Units/kg/hr Intravenous Continuous Oliver Hum, MD 9.6 mL/hr at 03/10/24 0601 14 Units/kg/hr at 03/10/24 0601 insulin NPH (HumuLIN,NovoLIN) injection 10 Units  10 Units Subcutaneous Q12H [facility] Oliver Hum, MD   10 Units at 03/10/24 0911    insulin regular (HumuLIN,NovoLIN) injection 0-20 Units  0-20 Units Subcutaneous Q6H [facility] Gracelyn Nurse L, NP   3 Units at 03/10/24 0616    insulin regular (HumuLIN,NovoLIN) injection 5 Units  5 Units Subcutaneous Q6H [facility] Oliver Hum, MD        ipratropium (ATROVENT) 0.02 % nebulizer solution 500 mcg  500 mcg Nebulization Q6H (RT) Oliver Hum, MD   500 mcg at 03/10/24 1002    meropenem (MERREM) 500 mg in sodium chloride 0.9 % (NS) 100 mL IVPB-MBP  500 mg Intravenous Q8H Ra, Vevelyn Francois, MD   Stopped at 03/10/24 0945    micafungin (MYCAMINE) 100 mg in sodium chloride 0.9 % (NS) 100 mL IVPB-MBP  100 mg Intravenous Q24H Montez Hageman, MD   Stopped at 03/10/24 0519    nicotine polacrilex (NICORETTE) gum 4 mg  4 mg Buccal Q1H PRN Oliver Hum, MD        Or  nicotine polacrilex (NICORETTE) lozenge 4 mg  4 mg Buccal Q1H PRN Oliver Hum, MD        NORepinephrine 8 mg in dextrose 5 % 250 mL (32 mcg/mL) infusion PMB  0-10 mcg/min Intravenous Continuous Montez Hageman, MD 9.4 mL/hr at 03/10/24 0835 5 mcg/min at 03/10/24 0835    NxStage RFP 400 (+/- BB) 5000 mL - contains 2 mEq/L of potassium dialysis solution 5,000 mL  5,000 mL CRRT Continuous Hajnoczky, Arna Medici, MD        NxStage/multiBic RFP 401 (+/- BB) 5000 mL - contains 4 mEq/L of potassium dialysis solution 5,000 mL  5,000 mL CRRT Continuous Hajnoczky, Arna Medici, MD   5,000 mL at 03/09/24 2216    ondansetron (ZOFRAN) injection 4 mg  4 mg Intravenous Q6H PRN Oliver Hum, MD        oxyCODONE (ROXICODONE) immediate release tablet 2.5 mg  2.5 mg Enteral tube: gastric Q4H PRN Gracelyn Nurse L, NP   2.5 mg at 03/08/24 2204    Or    oxyCODONE (ROXICODONE) immediate release tablet 5 mg  5 mg Enteral tube: gastric Q4H PRN Clide Cliff, NP        [START ON 03/11/2024] polyethylene glycol (MIRALAX) packet 17 g  17 g Enteral tube: gastric Daily Oliver Hum, MD        potassium phosphate 30 mmol in sodium chloride (NS) 0.9 % 250 mL infusion  30 mmol Intravenous Once Oliver Hum, MD 71.3 mL/hr at 03/10/24 0836 30 mmol at 03/10/24 0836    sennosides (SENOKOT) oral syrup  2.5 mL Enteral tube: gastric Nightly Oliver Hum, MD        sodium chloride (NS) 0.9 % flush 10 mL  10 mL Intravenous Q8H Ra, Vevelyn Francois, MD   10 mL at 03/10/24 0606    sodium chloride (NS) 0.9 % flush 10 mL  10 mL Intravenous Q8H Ra, Jin H, MD   10 mL at 03/10/24 0606    sodium chloride (NS) 0.9 % flush 10 mL  10 mL Intravenous Q8H Ra, Vevelyn Francois, MD   10 mL at 03/10/24 0606    umeclidinium (INCRUSE ELLIPTA) 62.5 mcg/actuation inhaler 1 puff  1 puff Inhalation Daily (RT) Oliver Hum, MD         Facility-Administered Medications Ordered in Other Encounters   Medication Dose Route Frequency Provider Last Rate Last Admin    EPINEPHrine 100 mcg/10 mL (10 mcg/mL) injection   Intravenous PRN (once a day) Ichite, Precious C, MD   10 mcg at 02/28/24 1511    Propofol (DIPRIVAN) injection   Intravenous PRN (once a day) Ichite, Precious C, MD   60 mg at 02/28/24 1507    succinylcholine (ANECTINE) injection   Intravenous PRN (once a day) Ichite, Precious C, MD   60 mg at 02/28/24 1507     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    Chronic kidney disease     Depression, psychotic (CMS-HCC)     Diabetes mellitus (CMS-HCC)     in past    Emphysema of lung (CMS-HCC)     Financial difficulties     Frail elderly     Hearing impairment     Hernia     History of transfusion  Hyperlipidemia     Hypertension     Impaired mobility     Osteoporosis     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/08/2015    Visual impairment      Family History   Problem Relation Age of Onset    Breast cancer Daughter 64    Diabetes Mother     Glaucoma Father     Colon cancer Neg Hx     Ovarian cancer Neg Hx Endometrial cancer Neg Hx     Anesthesia problems Neg Hx     Bleeding Disorder Neg Hx      Past Surgical History:   Procedure Laterality Date    ABDOMINAL SURGERY      BLADDER SURGERY      BREAST CYST EXCISION      CHEMOTHERAPY  2012    bladder    GALLBLADDER SURGERY      stone removal    ILEOSTOMY  2012    PR COLONOSCOPY FLX DX W/COLLJ SPEC WHEN PFRMD N/A 02/09/2015    Procedure: COLONOSCOPY, FLEXIBLE, PROXIMAL TO SPLENIC FLEXURE; DIAGNOSTIC, W/WO COLLECTION SPECIMEN BY BRUSH OR WASH;  Surgeon: Dewaine Conger, MD;  Location: [facility] [place];  Service: Gastroenterology    PR EXPLORATORY OF ABDOMEN N/A 02/28/2024    Procedure: EXPLORATORY LAPAROTOMY, EXPLORATORY CELIOTOMY WITH OR WITHOUT BIOPSY(S);  Surgeon: Renda Rolls, MD;  Location: [facility];  Service: General Surgery    PR RECONSTR TOTAL SHOULDER IMPLANT Left 04/08/2018    Procedure: ARTHROPLASTY, GLENOHUMERAL JOINT; TOTAL SHOULDER(GLENOID & PROXIMAL HUMERAL REPLACEMENT(EG, TOTAL SHOULDER);  Surgeon: Tomasa Rand, MD;  Location[facility];  Service: Ortho Sports Medicine    PR REOPEN RECENT ABD EXPLORATORY Midline 03/01/2024    Procedure: REOPENING OF RECENT LAPAROTOMY;  Surgeon: [name]., MD;  Location: OR [facility];  Service: Trauma    PR REOPEN RECENT ABD EXPLORATORY N/A 03/03/2024    Procedure: REOPENING OF RECENT LAPAROTOMY;  Surgeon: Joanie Coddington, MD;  Location: OR [facility];  Service: Trauma    PR SIGMOIDOSCOPY,BIOPSY N/A 03/11/2015    Procedure: Arnell Sieving; WITH BIOPSY, SINGLE OR MULTIPLE;  Surgeon: Wilburt Finlay, MD;  [facility];  Service: Gastroenterology    PR XCAPSL CTRC RMVL INSJ IO LENS PROSTH W/O ECP Left 06/05/2022    Procedure: EXTRACAPSULAR CATARACT REMOVAL W/INSERTION OF INTRAOCULAR LENS PROSTHESIS, MANUAL OR MECHANICAL TECHNIQUE WITHOUT ENDOSCOPIC CYCLOPHOTOCOAGULATION;  Surgeon: Garner Gavel, MD;  Location: [facility] OR [facility];  Service: Ophthalmology    PR XCAPSL CTRC RMVL INSJ IO LENS PROSTH W/O ECP Right 06/19/2022    Procedure: EXTRACAPSULAR CATARACT REMOVAL W/INSERTION OF INTRAOCULAR LENS PROSTHESIS, MANUAL OR MECHANICAL TECHNIQUE WITHOUT ENDOSCOPIC CYCLOPHOTOCOAGULATION;  Surgeon: Garner Gavel, MD;  Location: [facility] OR [facility];  Service: Ophthalmology     Social History     Tobacco Use    Smoking status: Every Day     Current packs/day: 1.00     Average packs/day: 1 pack/day for 65.9 years (65.9 ttl pk-yrs)     Types: Cigarettes     Start date: 04/09/1958    Smokeless tobacco: Never    Tobacco comments:     15cpd   Substance Use Topics    Alcohol use: No     Alcohol/week: 0.0 standard drinks of alcohol     General:          Self-Feeding Capacity: Impaired for self-feeding  Medical Tests / Procedures Comments: CXR 3/9: Large hiatal hernia with unchanged bilateral lower lung atelectasis. No pneumothorax.  Equipment/Environment: NGT, Telemetry, Supplemental oxygen    Objective  Temperature Spikes Noted: No  Respiratory Status : High flow nasal cannula  History of Intubation: Yes  Length of Intubations (days): 9 days  Date extubated: 03/09/24    Behavior/Cognition: Alert, Cooperative, Pleasant mood  Positioning : Upright in bed    Oral / Motor Exam  Vocal Quality: Wet, Weak      Labial ROM: Within Functional Limits   Labial Symmetry: Within Functional Limits      Lingual ROM: Within Functional Limits  Lingual Symmetry: Within Functional Limits         Velum: elevation grossly WNL   Mandible: Within Functional Limits     Facial ROM: Within Functional Limits   Facial Symmetry: Within Functional Limits  Facial Strength: Within Functional Limits      Vocal Intensity: Severely decreased       Apraxia: None present       Intelligibility: Intelligibility reduced   Breath Support: Inadequate for speech   Dentition: Edentulous    Consistencies assessed: ice chips, thin liquid via spoon    Patient at end of session: All needs in reach, Notified Nurse, Notified Provider, Friends/Family present    Speech Therapy Session Duration  SLP Individual [mins]: 15    I attest that I have reviewed the above information.  Signed: Nonie Hoyer, SLP    Ceasar Mons 03/10/2024

## 2024-03-10 NOTE — Unmapped (Signed)
 [facility] PROGRESS NOTE     Date of Service: 03/10/2024    Hospital Day: LOS: 12 days        Surgery Date: TBD   Surgical Attending: Aris Georgia, MD    Critical Care Attending: Thalia Party, MD    Interval History:   Restarted on NE last night, uptitrated to 6. CRRT filter expired, CRRT discontinued. Given increasing pressor requirements, RPP, blood cultures, MRSA nares sent off. Micafungin empirically started.      History of Present Illness:   Tammy Braun is a 83 y.o. female with PMH bladder cancer with cystectomy and ileal conduit, COPD, recent dx of afib, moderate-severe TR, CAD (high calcification score), T2DM presented to [facility] with projectile vomiting and abdominal pain, found to have SBO with transition point at level of ileostomy on CT A/P. On initial presentation, labs remarkable for leukocytosis thought to be iso hemoconcentration, AKI on CKD, mildly elevated venous lactate improved with resuscitation. Admitted to [facility] for worsening hypoxia on HFNC requiring intubation in setting of hiatal hernia. OR on 02/28/24 for ex-lap, reduciton of parastomal hernia, small bowel resection with primary anastomosis, lysis of adhesions, abdomen left open with ABThera in place.     Hospital Course:  - 02/26/24: Admitted to [facility], floor status, consult to urology for possible ileal conduit revision.   - 02/27/24: Patient level of care escalated to step-down iso increased oxygen needs while in ED.  - 02/28/24: Upgraded to ICU. Intubated at bed-side. OR for ex-lap, reduction of parastomal hernia, small bowel resection with primary anastomosis, lysis of adhesions, abdomen left open with ABThera in place.   - 03/01/24: OR for attempt at abdominal closure. Left open due to desaturations during closure attempt. ABThera in place.  - 03/03/24: RTOR for abdominal closure.  -03/06/24: Vas cath placed, CRRT started      ASSESSMENT & PLAN:     Neurologic:  - Pain: Oxy PRN, tylenol SCH    #AMS, somnolence, improving   - Differentials include stroke (ischemic vs hemorrhagic - low suspicion), metabolic encephalopathy 2/2 drug induced, vs uremia, vs infection   - Off sedation since 3/4   - 3/7 Head CT without acute changes  - CRRT, although given clearing of mental status with continued elevated BUN - low suspicion of uremia induced encephalopathy    Cardiovascular:  #HTN, HLD:   - Chronic.    - MAP goal > 65    > NE@ 70mcg/min currently, wean as able  - Resume home ASA and Lipitor     #Afib/flutter w RVR:   - Prior hx of paroxysmal afib. CHADSvasc = 6, no AC PTA per chart review.      > Holding home diltiazem, question efficacy given likely poor absorption  - amio 200mg  PO daily, load ended 3/10  - heparin gtt  - Echo 3/1: LVEF > 55%. Severe TR.  - Pro-BNP: 6,988 on 3/3     #Pulmonary embolism  - 3/5 CTA chest: Filling defect in L anterior segmental artery  - heparin gtt     #Acute thrombus right basilic   - Identified 3/3 on BUE duplex  - Supportive care, superficial, AC not required     Respiratory:  #Acute on chronic hypoxemia  #Hx of COPD  - Extubated 3/10 to HFNC    > currently on 35% FiO2, flow 60L    > wean flow and transition to [place]  - Aggressive pulmonary toilet  - Resume incruse ellipta     #Pseudomonas Pneumonia   #  VAP  - see ID for abx plan    FEN/GI:  F: Medlocked  E: Replete electrolytes PRN  N: NPO   - TF at goal  - GI prophylaxis: none indicated   - bowel regimen: resume senna and miralax   Last BM: 3/10  - Consult SLP given prolonged intubation    #S/p ex-lap, reduction of parastomal hernia, small bowel resection with primary anastomosis, lysis of adhesions  - 3/7 repeat imaging concerning for ongoing/new SBO, resolved    > Continues with BMs    #Large hernia, chronic  - Non-operative, chronic  - re-eval on CT chest/abd/pelvis 3/7 stable    Renal/Genitourinary:  #AKI on CKD  #uremia   - Cr downtrending on CRRT, UOP ~100 in 24h  - RUS : Bilateral renal cysts measuring up to 2.6 cm in the right upper pole and 1.9 cm left interpolar kidney   - Now off CRRT. Leave in [place], may need dialyzed in the future.    > 1x 4mg  IV Bumex   - Monitor for clinical signs of improvement  - Appreciate Nephrology's assistance    #Hydronephrosis:  - Caution with perioperative fluid resuscitation per Cards   - RUS completed:  No hydronephrosis.     #H/o bladder cancer s/p radical cystectomy with ileal conduit (2012):  #Parastomal hernia:  - SRU consulted, signed off 3/7  - Strict I&Os  - Foley catheter per stoma, if it falls out, please notify Urology to replace      Endocrine:   #hyperglycemia   - Glycemic Control: SSI, increase to 10 units NPH BID, monitor closely      Hematologic:   - Monitor CBC daily, transfuse for hgb 7mg /dL  - DVT ppx: heparin gtt    Immunologic/Infectious Disease:  - Afebrile  - Persistent leukocytosis, continue to monitor     - Cultures:   - Ucx (2/26): Mixed GP/GN organisms    - RPP (2/27): negative   - Blood cultures (3/4): NGTD @72h    - BAL (3/4): Pseudomonas aeruginosa   - BAL (3/8): 2+ on R, 1+ on L yeast    - Antimicrobials:    - Ceftriaxone 2/27- 2/28 - colonized UTI   - Zosyn (3/1 - 3/5) for abdominal contamination  - Zosyn (3/6) for pseudomonas when sensitivities pending  - Meropenum started (3/6-3/13) for Psedumonas PNA     Musculoskeletal:   #Deconditioned   - PT/OT consulted     Daily Care Checklist:   - Stress Ulcer Prevention: No  - DVT Prophylaxis: Chemical: Heparin drip  - Daily Awakening: Yes  - Spontaneous Breathing Trial: Yes  - Indication for Central/PICC Line: Yes  Infusions requiring central access, Hemodynamic monitoring, and Inadequate peripheral access, RIJ triple lumen  - Indication for Urinary Catheter: Yes  Strict intake and output, Agressive diuresis/hydration, and Urinary retention/obstruction  - Diagnostic images/reports of past 24hrs reviewed: Yes    Disposition:   - Continue ICU care  - PT/OT consulted:  order placed given extubation today     SUBJECTIVE:      Intubated. Follows commands. Denies pain. Nods vigorously to question of if she wants ETT removed.     OBJECTIVE:     Physical Exam:  Constitutional: Lying supine, NAD  Neurologic: Opens eyes to voice. Moves all 4 extremities to command. Remains intubated. Pupils equal and responsive to light.   Respiratory: Mechanically ventilated, diminished bilaterally  Cardiovascular: Bradycardic ~55bpm, regular rhythm  Gastrointestinal: Soft abdomen, midline incision staples in tact. Urostomy  with Foley in stoma draining clear yellow urine.   Musculoskeletal: Trace to no BLE/BUE edema.  Skin: Warm and dry     Temp:  [36.3 ??C (97.4 ??F)-36.8 ??C (98.2 ??F)] 36.8 ??C (98.2 ??F)  Heart Rate:  [50-66] 65  SpO2 Pulse:  [50-67] 65  Resp:  [13-26] 18  BP: (94-126)/(40-57) 126/40  MAP (mmHg):  [60-76] 73  A BP-1: (90-149)/(34-60) 123/42  MAP:  [53 mmHg-86 mmHg] 68 mmHg  FiO2 (%):  [30 %-40 %] 35 %  SpO2:  [90 %-100 %] 98 %     Recent Laboratory Results:  Recent Labs     Units 03/10/24  0355   PHART  7.43   PCO2ART mm Hg 37.5   PO2ART mm Hg 110.0   HCO3ART mmol/L 25   BEART  0.7   O2SATART % 98.6     Recent Labs     Units 03/09/24  1227 03/09/24  2059 03/10/24  0355   NA mmol/L 136 140 136 - 135   K mmol/L 3.5 3.5 3.4 - 3.4   CL mmol/L 100 99 99   CO2 mmol/L 27.0 27.0 26.0   BUN mg/dL 35* 32* 33*   CREATININE mg/dL 0.98* 1.19 1.47*   GLU mg/dL 829* 562* 130*     Lab Results   Component Value Date    BILITOT 0.2 (L) 03/08/2024    BILITOT 0.2 (L) 03/03/2024    BILIDIR 0.10 03/08/2024    BILIDIR 0.10 03/03/2024    ALT <7 (L) 03/08/2024    ALT 9 (L) 03/03/2024    AST 10 03/08/2024    AST 22 03/03/2024    GGT 16 09/17/2011    GGT 354 (H) 05/03/2011    ALKPHOS 53 03/08/2024    ALKPHOS 58 03/03/2024    PROT 5.4 (L) 03/08/2024    PROT 5.5 (L) 03/03/2024    ALBUMIN 2.0 (L) 03/08/2024    ALBUMIN 2.4 (L) 03/03/2024     Recent Labs     Units 03/09/24  1140 03/09/24  1736 03/09/24  2135 03/10/24  0051   POCGLU mg/dL 865 784 696* 295*     Recent Labs     Units 03/08/24  0357 03/09/24  0401 03/10/24  0353 03/10/24  0355   WBC 10*9/L 23.9* 25.6*  --  22.6*   RBC 10*12/L 2.95* 3.01*  --  2.93*   HGB g/dL 8.3* 8.4*  --  8.0*   HCT % 24.7* 25.2*  --  24.4*   MCV fL 83.5 84.0  --  83.4   MCH pg 28.1 27.8  --  27.4   MCHC g/dL 28.4 13.2  --  44.0   RDW % 18.3* 17.6*  --  17.9*   PLT 10*9/L 164 161  --  149*   MPV fL 8.8 9.1 Not Detected 9.6     Recent Labs     Units 03/10/24  0355   APTT sec 35.4      Lines & Tubes:   Patient Lines/Drains/Airways Status       Active Peripheral & Central Intravenous Access       Name Placement date Placement time Site Days    Peripheral IV 03/04/24 Anterior;Proximal;Right Forearm 03/04/24  0021  Forearm  6    CVC Triple Lumen 03/07/24 Non-tunneled Left Internal jugular 03/07/24  0849  Internal jugular  2    Hemodialysis Catheter With Distal Infusion Port 03/06/24 Right Internal jugular 1.2 mL 1.2 mL 03/06/24  2301  Internal  jugular  3                     Patient Lines/Drains/Airways Status       Active Wounds       Name Placement date Placement time Site Days    Surgical Site 04/08/18 Shoulder Left 04/08/18  1338  -- 2162    Surgical Site 06/05/22 Eye Left 06/05/22  0928  -- 643    Surgical Site 06/19/22 Eye Right 06/19/22  1024  -- 629    Surgical Site 03/03/24 Abdomen 03/03/24  1013  -- 6    Wound 06/24/20 Other (comment) Buttocks inner right buttock 06/24/20  1010  Buttocks  1354                     Respiratory/ventilator settings for last 24 hours:   Vent Mode: PSV-CPAP  FiO2 (%): 35 %  PEEP: 8 cm H20  PR SUP: 0 cm H20    Intake/Output last 3 shifts:  I/O last 3 completed shifts:  In: 3089.9 [I.V.:1029.1; NG/GT:260; IV Piggyback:760.8]  Out: 5177 [Urine:190; Emesis/NG output:25; Drains:545; Other:4417]    Daily/Recent Weight:  68.9 kg (151 lb 14.4 oz)    BMI:  Body mass index is 28.72 kg/m??.    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    Chronic kidney disease     Depression, psychotic (CMS-HCC)     Diabetes mellitus (CMS-HCC)     in past    Emphysema of lung (CMS-HCC)     Financial difficulties     Frail elderly     Hearing impairment     Hernia     History of transfusion     Hyperlipidemia     Hypertension     Impaired mobility     Osteoporosis     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/08/2015    Visual impairment      Past Surgical History:   Procedure Laterality Date    ABDOMINAL SURGERY      BLADDER SURGERY      BREAST CYST EXCISION      CHEMOTHERAPY  2012    bladder    GALLBLADDER SURGERY      stone removal    ILEOSTOMY  2012    PR COLONOSCOPY FLX DX W/COLLJ SPEC WHEN PFRMD N/A 02/09/2015    Procedure: COLONOSCOPY, FLEXIBLE, PROXIMAL TO SPLENIC FLEXURE; DIAGNOSTIC, W/WO COLLECTION SPECIMEN BY BRUSH OR WASH;  Surgeon: Dewaine Conger, MD;  Location: [facility] [place];  Service: Gastroenterology    PR EXPLORATORY OF ABDOMEN N/A 02/28/2024    Procedure: EXPLORATORY LAPAROTOMY, EXPLORATORY CELIOTOMY WITH OR WITHOUT BIOPSY(S);  Surgeon: Renda Rolls, MD;  Location: [facility];  Service: General Surgery    PR RECONSTR TOTAL SHOULDER IMPLANT Left 04/08/2018    Procedure: ARTHROPLASTY, GLENOHUMERAL JOINT; TOTAL SHOULDER(GLENOID & PROXIMAL HUMERAL REPLACEMENT(EG, TOTAL SHOULDER);  Surgeon: Tomasa Rand, MD;  Location: [facility] OR [facility];  Service: Ortho Sports Medicine    PR REOPEN RECENT ABD EXPLORATORY Midline 03/01/2024    Procedure: REOPENING OF RECENT LAPAROTOMY;  Surgeon: [name]., MD;  Location: OR [facility];  Service: Trauma    PR REOPEN RECENT ABD EXPLORATORY N/A 03/03/2024  Procedure: REOPENING OF RECENT LAPAROTOMY;  Surgeon: Joanie Coddington, MD;  Location: OR [facility];  Service: Trauma    PR SIGMOIDOSCOPY,BIOPSY N/A 03/11/2015    Procedure: SIGMOIDOSCOPY, FLEXIBLE; WITH BIOPSY, SINGLE OR MULTIPLE;  Surgeon: Wilburt Finlay, MD;  [facility];  Service: Gastroenterology    PR XCAPSL CTRC RMVL INSJ IO LENS PROSTH W/O ECP Left 06/05/2022    Procedure: EXTRACAPSULAR CATARACT REMOVAL W/INSERTION OF INTRAOCULAR LENS PROSTHESIS, MANUAL OR MECHANICAL TECHNIQUE WITHOUT ENDOSCOPIC CYCLOPHOTOCOAGULATION;  Surgeon: Garner Gavel, MD;  Location: [facility] OR [facility];  Service: Ophthalmology    PR XCAPSL CTRC RMVL INSJ IO LENS PROSTH W/O ECP Right 06/19/2022    Procedure: EXTRACAPSULAR CATARACT REMOVAL W/INSERTION OF INTRAOCULAR LENS PROSTHESIS, MANUAL OR MECHANICAL TECHNIQUE WITHOUT ENDOSCOPIC CYCLOPHOTOCOAGULATION;  Surgeon: Garner Gavel, MD;  Location: [facility]  Service: Ophthalmology     Scheduled Medications:   acetaminophen  650 mg Enteral tube: gastric Q6H    amiodarone  200 mg Enteral tube: gastric Daily    aspirin  81 mg Enteral tube: gastric Daily    atorvastatin  40 mg Enteral tube: gastric Daily    budesonide (PULMICORT) nebulizer solution  0.5 mg Nebulization BID (RT)    flu vacc ts2024-25(40yr up)-PF  0.5 mL Intramuscular During hospitalization    [START ON 03/14/2024] fluPHENAZine decanoate  50 mg Intramuscular Q30 Days    insulin NPH  7 Units Subcutaneous Q12H SCH    insulin regular  0-20 Units Subcutaneous Q6H SCH    ipratropium  500 mcg Nebulization Q6H (RT)    meropenem  500 mg Intravenous Q8H    micafungin  100 mg Intravenous Q24H    [Provider Hold] polyethylene glycol  17 g Oral Daily    [Provider Hold] sennosides  2.5 mL Oral Nightly    sodium chloride  10 mL Intravenous Q8H    sodium chloride  10 mL Intravenous Q8H    sodium chloride  10 mL Intravenous Q8H     Continuous Infusions:   heparin 13 Units/kg/hr (03/10/24 0400)    NORepinephrine bitartrate-NS 6 mcg/min (03/10/24 0431)    NxStage RFP 400 (+/- BB) 5000 mL - contains 2 mEq/L of potassium      NxStage/multiBic RFP 401 (+/- BB) 5000 mL - contains 4 mEq/L of potassium       PRN Medications:  albuterol, [Provider Hold] cyclobenzaprine, dextrose in water, heparin (porcine), heparin (porcine), nicotine polacrilex **OR** nicotine polacrilex, ondansetron, oxyCODONE **OR** oxyCODONE    Oliver Hum, MD   Surgical Intensive Care Unit  [facility]

## 2024-03-10 NOTE — Unmapped (Addendum)
 ICU TRANSPORT NOTE    Destination: CT    Departing Unit: [facility]  Pickup Time: 1620    [place] patient ID band verified  Allergies Reviewed  Code Status at time of transport: Full    Report received from primary nurse via SBARq. Upon arrival to unit, RT at bedside, titrating oxygen up on HFNC due to large amounts of emesis when patient laid flat for transfer to stretcher.  Some question as to whether patient may have had an aspiration event.  Pt unable to be put flat without decrease in oxygen saturations and unable to maintain sats on NRB.  MD cancelled scan and NGT placed to LIWS.

## 2024-03-10 NOTE — Unmapped (Signed)
 [facility] PROGRESS NOTE     Date of Service: 03/10/2024    Hospital Day: LOS: 12 days        Surgery Date: TBD   Surgical Attending: Aris Georgia, MD    Critical Care Attending: Thalia Party, MD    Interval History:   Back on low dose levo overnight. Had emesis event following administration of PO contrast. ?aspiration event. Chest XR relatively unchanged. Treated for hypoglycemia in setting of receiving insulin while TF held - insulin held.     History of Present Illness:   Tammy Braun is a 83 y.o. female with PMH bladder cancer with cystectomy and ileal conduit, COPD, recent dx of afib, moderate-severe TR, CAD (high calcification score), T2DM presented to [facility] with projectile vomiting and abdominal pain, found to have SBO with transition point at level of ileostomy on CT A/P. On initial presentation, labs remarkable for leukocytosis thought to be iso hemoconcentration, AKI on CKD, mildly elevated venous lactate improved with resuscitation. Admitted to [facility] for worsening hypoxia on HFNC requiring intubation in setting of hiatal hernia. OR on 02/28/24 for ex-lap, reduciton of parastomal hernia, small bowel resection with primary anastomosis, lysis of adhesions, abdomen left open with ABThera in place.     Hospital Course:  - 02/26/24: Admitted to [facility], floor status, consult to urology for possible ileal conduit revision.   - 02/27/24: Patient level of care escalated to step-down iso increased oxygen needs while in ED.  - 02/28/24: Upgraded to ICU. Intubated at bed-side. OR for ex-lap, reduction of parastomal hernia, small bowel resection with primary anastomosis, lysis of adhesions, abdomen left open with ABThera in place.   - 03/01/24: OR for attempt at abdominal closure. Left open due to desaturations during closure attempt. ABThera in place.  - 03/03/24: RTOR for abdominal closure  -03/06/24: Vas cath placed, CRRT started   -03/09/24: Extubated to HFNC  -03/10/24: CRRT discontinued      ASSESSMENT & PLAN: Neurologic:  - Pain: Oxy PRN, tylenol SCH    #AMS, somnolence, improving   - Mental status cleared at this point, favor infectious related +/- slow renal clearance of medications    Cardiovascular:  #HTN, HLD:   - Chronic.    - MAP goal > 65    > NE, wean as able  - Resume home ASA and Lipitor     #Afib/flutter w RVR:   - Prior hx of paroxysmal afib. CHADSvasc = 6, no AC PTA per chart review.      > Holding home diltiazem, question efficacy given likely poor absorption  - amio 200mg  PO daily, load ended 3/10  - heparin gtt  - Echo 3/1: LVEF > 55%. Severe TR.  - Pro-BNP: 6,988 on 3/3     #Pulmonary embolism  - 3/5 CTA chest: Filling defect in L anterior segmental artery  - heparin gtt     #Acute thrombus right basilic   - Identified 3/3 on BUE duplex  - Supportive care, superficial, AC not required     Respiratory:  #Acute on chronic hypoxemia  #Hx of COPD  - Extubated 3/10 to HFNC, wean as able    > wean flow and transition to [place]  - Aggressive pulmonary toilet  - Continue incruse ellipta     #Pseudomonas Pneumonia   #VAP  - see ID for abx plan    FEN/GI:  F: Medlocked  E: Replete electrolytes PRN  N: NPO   - TF held 2/2 emesis ; NG tube to  LIWS   - GI prophylaxis: none indicated   - bowel regimen: resume senna and miralax   Last BM: 3/10  - Consult SLP given prolonged intubation    #S/p ex-lap, reduction of parastomal hernia, small bowel resection with primary anastomosis, lysis of adhesions  - 3/7 repeat imaging concerning for ongoing/new SBO, resolved    > Continues with BMs    #Large hernia, chronic  - Non-operative, chronic  - re-eval on CT chest/abd/pelvis 3/7 stable    Renal/Genitourinary:  #AKI on CKD  #uremia   - Cr downtrending  - RUS : Bilateral renal cysts measuring up to 2.6 cm in the right upper pole and 1.9 cm left interpolar kidney   - CRRT discontinued 3/10. Leave in [place], may need dialyzed in the future.    > 2x 4mg  IV Bumex --> poor response 3/11  - No urgent HD needs at this time 3/11PM  - Monitor for clinical signs of improvement  - Appreciate Nephrology's assistance    #Hydronephrosis, resolved  - RUS completed:  No hydronephrosis.     #H/o bladder cancer s/p radical cystectomy with ileal conduit (2012):  #Parastomal hernia:  - SRU signed off 3/7  - Strict I&Os  - Foley catheter per stoma, if it falls out, please notify Urology to replace      Endocrine:   #hyperglycemia   - Glycemic Control: SSI, 10 units NPH BID- holding while NPO given significant hypoglycemic event, monitor closely      Hematologic:   - Monitor CBC daily, transfuse for hgb 7mg /dL  - DVT ppx: heparin gtt    Immunologic/Infectious Disease:  - Afebrile  - Persistent leukocytosis, continue to monitor     - Cultures:   - Ucx (2/26): Mixed GP/GN organisms    - RPP (2/27): negative   - Blood cultures (3/4): NGTD @72h    - BAL (3/4): Pseudomonas aeruginosa   - BAL (3/8): 2+ on R, 1+ on L yeast    - Antimicrobials:    - Ceftriaxone 2/27- 2/28 - colonized UTI   - Zosyn (3/1 - 3/5) for abdominal contamination  - Zosyn (3/6) for pseudomonas when sensitivities pending  - Meropenum started (3/6-3/13) for Psedumonas PNA     Musculoskeletal:   #Deconditioned   - PT/OT consulted     Daily Care Checklist:   - Stress Ulcer Prevention: No  - DVT Prophylaxis: Chemical: Heparin drip  - Daily Awakening: N/A  - Spontaneous Breathing Trial: Yes  - Indication for Central/PICC Line: Yes  Infusions requiring central access, Hemodynamic monitoring, and Inadequate peripheral access, RIJ triple lumen  - Indication for Urinary Catheter: Yes  Strict intake and output, Agressive diuresis/hydration, and Urinary retention/obstruction  - Diagnostic images/reports of past 24hrs reviewed: Yes    Disposition:   - Continue ICU care  - PT/OT consulted:yes     SUBJECTIVE:    Alert and appropriate. Follows commands. Denies pain.     OBJECTIVE:     Physical Exam:  Constitutional: Lying supine, NAD  Neurologic: Opens eyes to voice. Moves all 4 extremities to command. Pupils equal and responsive to light.   Respiratory: Mechanically ventilated, diminished bilaterally  Cardiovascular: Bradycardic, regular rhythm s1, s2  Gastrointestinal: Soft abdomen, midline incision staples in tact. Urostomy with Foley in stoma draining clear yellow urine.   Musculoskeletal: Trace to no BLE/BUE edema.  Skin: Warm and dry     Temp:  [36.3 ??C (97.4 ??F)-36.8 ??C (98.2 ??F)] 36.5 ??C (97.7 ??F)  Heart Rate:  [  51-74] 67  SpO2 Pulse:  [51-74] 63  Resp:  [13-32] 29  BP: (90-145)/(38-69) 126/51  MAP (mmHg):  [54-89] 77  A BP-1: (92-153)/(34-65) 125/41  MAP:  [53 mmHg-94 mmHg] 60 mmHg  FiO2 (%):  [35 %-100 %] 100 %  SpO2:  [83 %-100 %] 94 %     Recent Laboratory Results:  Recent Labs     Units 03/10/24  0355   PHART  7.43   PCO2ART mm Hg 37.5   PO2ART mm Hg 110.0   HCO3ART mmol/L 25   BEART  0.7   O2SATART % 98.6     Recent Labs     Units 03/09/24  2059 03/10/24  0355 03/10/24  1524   NA mmol/L 140 136 - 135 138   K mmol/L 3.5 3.4 - 3.4 4.5   CL mmol/L 99 99 98   CO2 mmol/L 27.0 26.0 25.0   BUN mg/dL 32* 33* 39*   CREATININE mg/dL 4.03 4.74* 2.59*   GLU mg/dL 563* 875* 643*     Lab Results   Component Value Date    BILITOT 0.2 (L) 03/08/2024    BILITOT 0.2 (L) 03/03/2024    BILIDIR 0.10 03/08/2024    BILIDIR 0.10 03/03/2024    ALT <7 (L) 03/08/2024    ALT 9 (L) 03/03/2024    AST 10 03/08/2024    AST 22 03/03/2024    GGT 16 09/17/2011    GGT 354 (H) 05/03/2011    ALKPHOS 53 03/08/2024    ALKPHOS 58 03/03/2024    PROT 5.4 (L) 03/08/2024    PROT 5.5 (L) 03/03/2024    ALBUMIN 2.0 (L) 03/08/2024    ALBUMIN 2.4 (L) 03/03/2024     Recent Labs     Units 03/10/24  2108 03/10/24  2145 03/10/24  2146 03/10/24  2152   POCGLU mg/dL 32* <32* 951 884     Recent Labs     Units 03/08/24  0357 03/09/24  0401 03/10/24  0353 03/10/24  0355   WBC 10*9/L 23.9* 25.6*  --  22.6*   RBC 10*12/L 2.95* 3.01*  --  2.93*   HGB g/dL 8.3* 8.4*  --  8.0*   HCT % 24.7* 25.2*  --  24.4*   MCV fL 83.5 84.0  --  83.4   MCH pg 28.1 27.8  --  27.4 MCHC g/dL 16.6 06.3  --  01.6   RDW % 18.3* 17.6*  --  17.9*   PLT 10*9/L 164 161  --  149*   MPV fL 8.8 9.1 Not Detected 9.6     Recent Labs     Units 03/10/24  0355 03/10/24  1139   APTT sec 35.4 46.9*      Lines & Tubes:   Patient Lines/Drains/Airways Status       Active Peripheral & Central Intravenous Access       Name Placement date Placement time Site Days    Peripheral IV 03/04/24 Anterior;Proximal;Right Forearm 03/04/24  0021  Forearm  6    CVC Triple Lumen 03/07/24 Non-tunneled Left Internal jugular 03/07/24  0849  Internal jugular  3    Hemodialysis Catheter With Distal Infusion Port 03/06/24 Right Internal jugular 1.2 mL 1.2 mL 03/06/24  2301  Internal jugular  3                     Patient Lines/Drains/Airways Status       Active Wounds  Name Placement date Placement time Site Days    Surgical Site 04/08/18 Shoulder Left 04/08/18  1338  -- 2163    Surgical Site 06/05/22 Eye Left 06/05/22  0928  -- 644    Surgical Site 06/19/22 Eye Right 06/19/22  1024  -- 630    Surgical Site 03/03/24 Abdomen 03/03/24  1013  -- 7    Wound 06/24/20 Other (comment) Buttocks inner right buttock 06/24/20  1010  Buttocks  1355                     Respiratory/ventilator settings for last 24 hours:   FiO2 (%): 100 %    Intake/Output last 3 shifts:  I/O last 3 completed shifts:  In: 4804.1 [I.V.:1139.9; NG/GT:1830; IV Piggyback:1197.5]  Out: 2266 [Urine:290; Drains:300; Other:1676]    Daily/Recent Weight:  68 kg (149 lb 14.6 oz)    BMI:  Body mass index is 28.34 kg/m??.    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    Chronic kidney disease     Depression, psychotic (CMS-HCC)     Diabetes mellitus (CMS-HCC)     in past    Emphysema of lung (CMS-HCC)     Financial difficulties     Frail elderly     Hearing impairment     Hernia     History of transfusion Hyperlipidemia     Hypertension     Impaired mobility     Osteoporosis     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/08/2015    Visual impairment      Past Surgical History:   Procedure Laterality Date    ABDOMINAL SURGERY      BLADDER SURGERY      BREAST CYST EXCISION      CHEMOTHERAPY  2012    bladder    GALLBLADDER SURGERY      stone removal    ILEOSTOMY  2012    PR COLONOSCOPY FLX DX W/COLLJ SPEC WHEN PFRMD N/A 02/09/2015    Procedure: COLONOSCOPY, FLEXIBLE, PROXIMAL TO SPLENIC FLEXURE; DIAGNOSTIC, W/WO COLLECTION SPECIMEN BY BRUSH OR WASH;  Surgeon: Dewaine Conger, MD;  Location: [facility] [place];  Service: Gastroenterology    PR EXPLORATORY OF ABDOMEN N/A 02/28/2024    Procedure: EXPLORATORY LAPAROTOMY, EXPLORATORY CELIOTOMY WITH OR WITHOUT BIOPSY(S);  Surgeon: Renda Rolls, MD;  Location: [facility];  Service: General Surgery    PR RECONSTR TOTAL SHOULDER IMPLANT Left 04/08/2018    Procedure: ARTHROPLASTY, GLENOHUMERAL JOINT; TOTAL SHOULDER(GLENOID & PROXIMAL HUMERAL REPLACEMENT(EG, TOTAL SHOULDER);  Surgeon: Tomasa Rand, MD;  Location: [facility] OR [facility];  Service: Ortho Sports Medicine    PR REOPEN RECENT ABD EXPLORATORY Midline 03/01/2024    Procedure: REOPENING OF RECENT LAPAROTOMY;  Surgeon: [name]., MD;  Location: OR [facility];  Service: Trauma    PR REOPEN RECENT ABD EXPLORATORY N/A 03/03/2024    Procedure: REOPENING OF RECENT LAPAROTOMY;  Surgeon: Joanie Coddington, MD;  Location: OR [facility];  Service: Trauma    PR SIGMOIDOSCOPY,BIOPSY N/A 03/11/2015    Procedure: SIGMOIDOSCOPY, FLEXIBLE; WITH BIOPSY, SINGLE OR MULTIPLE;  Surgeon: Wilburt Finlay, MD;  [facility]: [facility];  Service: Gastroenterology    PR XCAPSL CTRC  RMVL INSJ IO LENS PROSTH W/O ECP Left 06/05/2022    Procedure: EXTRACAPSULAR CATARACT REMOVAL W/INSERTION OF INTRAOCULAR LENS PROSTHESIS, MANUAL OR MECHANICAL TECHNIQUE WITHOUT ENDOSCOPIC CYCLOPHOTOCOAGULATION;  Surgeon: Garner Gavel, MD;  Location: [facility] OR [facility];  Service: Ophthalmology    PR XCAPSL CTRC RMVL INSJ IO LENS PROSTH W/O ECP Right 06/19/2022    Procedure: EXTRACAPSULAR CATARACT REMOVAL W/INSERTION OF INTRAOCULAR LENS PROSTHESIS, MANUAL OR MECHANICAL TECHNIQUE WITHOUT ENDOSCOPIC CYCLOPHOTOCOAGULATION;  Surgeon: Garner Gavel, MD;  Location: [facility] OR [facility];  Service: Ophthalmology     Scheduled Medications:   acetaminophen  650 mg Enteral tube: gastric Q6H    amiodarone  200 mg Enteral tube: gastric Daily    aspirin  81 mg Enteral tube: gastric Daily    atorvastatin  40 mg Enteral tube: gastric Daily    flu vacc ts2024-25(39yr up)-PF  0.5 mL Intramuscular During hospitalization    [START ON 03/14/2024] fluPHENAZine decanoate  50 mg Intramuscular Q30 Days    insulin regular  0-20 Units Subcutaneous Q6H SCH    meropenem  500 mg Intravenous Q8H    [START ON 03/11/2024] polyethylene glycol  17 g Enteral tube: gastric Daily    sennosides  2.5 mL Enteral tube: gastric Nightly    sodium chloride  10 mL Intravenous Q8H    sodium chloride  10 mL Intravenous Q8H    sodium chloride  10 mL Intravenous Q8H    umeclidinium  1 puff Inhalation Daily (RT)     Continuous Infusions:   heparin 14 Units/kg/hr (03/10/24 0601)    NORepinephrine bitartrate-NS 5 mcg/min (03/10/24 2208)    NxStage RFP 400 (+/- BB) 5000 mL - contains 2 mEq/L of potassium      NxStage/multiBic RFP 401 (+/- BB) 5000 mL - contains 4 mEq/L of potassium       PRN Medications:  albuterol, [Provider Hold] cyclobenzaprine, dextrose in water, heparin (porcine), heparin (porcine), nicotine polacrilex **OR** nicotine polacrilex, ondansetron, oxyCODONE **OR** oxyCODONE    Tammy Braun is critically ill due to acute kidney injury, altered mental status, atrial fibrillation, bradycardia, coagulopathy, hypotension, pneumonia, pulmonary embolism, and renal insufficiency,    . This critical care time includes examining the patient, evaluating the hemodynamic, laboratory, and radiographic data, independently developing a comprehensive management plan, and serially assessing the patient's response to these critical care interventions. This critical care time excludes procedures.    Critical care time: 40 minutes       Katina Degree, ACNP   Surgical Intensive Care Unit  [facility]

## 2024-03-10 NOTE — Unmapped (Addendum)
 OCCUPATIONAL THERAPY  Evaluation (03/10/24 1020)    Patient Name:  Tammy Braun       Medical Record Number: [id number]     Date of Birth: April 20, 1941  Sex: Female      Post-Discharge Occupational Therapy Recommendations: 5x weekly, Low intensity          Equipment Recommendation  OT DME Recommendations: Defer to post acute       OT Treatment Diagnosis: Need for assistance with personal care         Assessment  Problem List: Decreased safety awareness, Impaired judgement, Decreased strength, Decreased range of motion, Decreased activity tolerance, Impaired balance, Decreased endurance, Fall risk, Impaired ADLs             Assessment: Rucha Wissinger is a 83 y.o. female with PMH bladder cancer with cystectomy and ileal conduit, COPD, recent dx of afib, moderate-severe TR, CAD (high calcification score), T2DM presented to [facility] with projectile vomiting and abdominal pain, found to have SBO with transition point at level of ileostomy on CT A/P. On initial presentation, labs remarkable for leukocytosis thought to be iso hemoconcentration, AKI on CKD, mildly elevated venous lactate improved with resuscitation. Admitted to [facility] for worsening hypoxia on HFNC requiring intubation in setting of hiatal hernia. OR on 02/28/24 for ex-lap, reduciton of parastomal hernia, small bowel resection with primary anastomosis, lysis of adhesions, abdomen left open with ABThera in place.     Hospital Course:  - 02/26/24: Admitted to [facility], floor status, consult to urology for possible ileal conduit revision.   - 02/27/24: Patient level of care escalated to step-down iso increased oxygen needs while in ED.  - 02/28/24: Upgraded to ICU. Intubated at bed-side. OR for ex-lap, reduction of parastomal hernia, small bowel resection with primary anastomosis, lysis of adhesions, abdomen left open with ABThera in place.   - 03/01/24: OR for attempt at abdominal closure. Left open due to desaturations during closure attempt. ABThera in place.  - 03/03/24: RTOR for abdominal closure.  - 3/7/25Timmothy Euler cath placed, CRRT started     Pt presents with above stated deficits impacting her independence & safety completing ADLs & functional mobility/transfers.  Pt will benefit from skilled OT to maximize her functional independence, safety and decrease her burden of care.  Review of pt's occupational profile, client history, assessment of occupational performance, clinical decision making and development of POC required high complexity OT evaluation.          Today's Interventions: Pt and daughter educated on role of OT, POC, abdominal precautions, safety awareness, balance training, midline orientation, caregiver education, importance & benefits of early engagement in ADLs    Activity Tolerance During Today's Session  Tolerated treatment well    Plan  Planned Frequency of Treatment: Plan of Care Initiated: 03/10/24  1-2x per day Weekly Frequency: 3-4 days per week  Planned Treatment Duration: 03/24/24    Planned Interventions:  Clinical research associate, Education (Patient/Family/Caregiver), Home Exercise Program, Self-Care/Home Training, Therapeutic Exercise, Therapeutic Activity      GOALS:   Patient and Family Goals: to get up to the chair    Short Term:   SHORT GOAL #1: Pt will complete OOB assessment.   Time Frame : 2 weeks  SHORT GOAL #2: Pt will wash her face and hands with set up.   Time Frame : 2 weeks  SHORT GOAL #3: Pt will complete UBD with min assist.   Time Frame : 2 weeks  SHORT GOAL #4: Pt will complete  LBD with max assist.   Time Frame : 2 weeks    Long Term Goal #1: Pt will score 15/24 on AMPAC  Time Frame: 4 weeks    Prognosis:  Fair  Positive Indicators:  PLOF  Barriers to Discharge: Functional strength deficits, Decreased range of motion, Inability to safely perform ADLS, Decreased safety awareness, Endurance deficits, Impaired Balance, Gait instability    Subjective  Medical Updates Since Last Visit/Relevant PMH Affecting Clinical Decision Making:    Prior Functional Status Per daughter: Pt completes ADLs at the following assist: eating=set up, grooming=set up, bathing=min assist (seated, sponge bath, assist with buttocks), UBD=min assist (assist to pull over head), LBD=set up mostly, assist if pants tight, toileting=can manage her ostomy mostly, assist if a big one.  Pt uses cane for steps, RW in house and transport w/c for longer distances.    Medical Tests / Procedures: review       Patient / Caregiver reports: Gimme my stick.  Come on.  Let's go.      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    Chronic kidney disease     Depression, psychotic (CMS-HCC)     Diabetes mellitus (CMS-HCC)     in past    Emphysema of lung (CMS-HCC)     Financial difficulties     Frail elderly     Hearing impairment     Hernia     History of transfusion     Hyperlipidemia     Hypertension     Impaired mobility     Osteoporosis     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/08/2015    Visual impairment     Social History     Tobacco Use    Smoking status: Every Day     Current packs/day: 1.00     Average packs/day: 1 pack/day for 65.9 years (65.9 ttl pk-yrs)     Types: Cigarettes     Start date: 04/09/1958    Smokeless tobacco: Never    Tobacco comments:     15cpd   Substance Use Topics    Alcohol use: No     Alcohol/week: 0.0 standard drinks of alcohol      Past Surgical History:   Procedure Laterality Date    ABDOMINAL SURGERY      BLADDER SURGERY      BREAST CYST EXCISION      CHEMOTHERAPY  2012    bladder    GALLBLADDER SURGERY      stone removal    ILEOSTOMY  2012    PR COLONOSCOPY FLX DX W/COLLJ SPEC WHEN PFRMD N/A 02/09/2015    Procedure: COLONOSCOPY, FLEXIBLE, PROXIMAL TO SPLENIC FLEXURE; DIAGNOSTIC, W/WO COLLECTION SPECIMEN BY BRUSH OR WASH;  Surgeon: Dewaine Conger, MD;  Location: [facility];  Service: Gastroenterology    PR EXPLORATORY OF ABDOMEN N/A 02/28/2024    Procedure: EXPLORATORY LAPAROTOMY, EXPLORATORY CELIOTOMY WITH OR WITHOUT BIOPSY(S);  Surgeon: Renda Rolls, MD;  Location[facility];  Service: General Surgery    PR RECONSTR TOTAL SHOULDER IMPLANT Left 04/08/2018    Procedure: ARTHROPLASTY, GLENOHUMERAL JOINT; TOTAL SHOULDER(GLENOID & PROXIMAL HUMERAL REPLACEMENT(EG, TOTAL SHOULDER);  Surgeon: Tomasa Rand, MD;  Location: [facility] OR [facility];  Service:  Ortho Sports Medicine    PR REOPEN RECENT ABD EXPLORATORY Midline 03/01/2024    Procedure: REOPENING OF RECENT LAPAROTOMY;  Surgeon: [name]., MD;  Location: OR [facility];  Service: Trauma    PR REOPEN RECENT ABD EXPLORATORY N/A 03/03/2024    Procedure: REOPENING OF RECENT LAPAROTOMY;  Surgeon: Joanie Coddington, MD;  Location: OR [facility];  Service: Trauma    PR SIGMOIDOSCOPY,BIOPSY N/A 03/11/2015    Procedure: SIGMOIDOSCOPY, FLEXIBLE; WITH BIOPSY, SINGLE OR MULTIPLE;  Surgeon: Wilburt Finlay, MD;  [facility];  Service: Gastroenterology    PR XCAPSL CTRC RMVL INSJ IO LENS PROSTH W/O ECP Left 06/05/2022    Procedure: EXTRACAPSULAR CATARACT REMOVAL W/INSERTION OF INTRAOCULAR LENS PROSTHESIS, MANUAL OR MECHANICAL TECHNIQUE WITHOUT ENDOSCOPIC CYCLOPHOTOCOAGULATION;  Surgeon: Garner Gavel, MD;  Location: [facility] OR [facility];  Service: Ophthalmology    PR XCAPSL CTRC RMVL INSJ IO LENS PROSTH W/O ECP Right 06/19/2022    Procedure: EXTRACAPSULAR CATARACT REMOVAL W/INSERTION OF INTRAOCULAR LENS PROSTHESIS, MANUAL OR MECHANICAL TECHNIQUE WITHOUT ENDOSCOPIC CYCLOPHOTOCOAGULATION;  Surgeon: Garner Gavel, MD;  Location: [facility] OR [facility];  Service: Ophthalmology    Family History   Problem Relation Age of Onset    Breast cancer Daughter 53    Diabetes Mother     Glaucoma Father     Colon cancer Neg Hx     Ovarian cancer Neg Hx     Endometrial cancer Neg Hx     Anesthesia problems Neg Hx     Bleeding Disorder Neg Hx         Lisinopril, Losartan, and Hctz [hydrochlorothiazide]     Objective Findings  Precautions / Restrictions  Falls precautions, Other precautions  abdominal    Weight Bearing  Non-applicable    Required Braces or Orthoses  Non-applicable    Communication Preference  Verbal       Pain  no c/o pain    Equipment / Environment  Vascular access (PIV, TLC, Port-a-cath, PICC), NGT, Telemetry, Foley, Arterial line, Colostomy, JP drain(s), Supplemental oxygen    Living Situation  Living Environment: House  Lives With: Daughter (67 yo grandson)  Home Living: One level home, Stairs to enter with rails, Tub/shower unit, Shower chair with back, Grab bars in shower, Hand-held shower hose, Handicapped height toilet  Rail placement (outside): Bilateral rails in reach  Number of Stairs to Enter (outside): 4  Caregiver Identified?: Yes  Caregiver Availability: Intermittent  Caregiver Ability: Limited lifting  Equipment available at home: Straight cane, Rolling walker, Lift-recliner (transport w/c, shower chair)     Cognition   Orientation Level:  Oriented x 4   Arousal/Alertness:  Appropriate responses to stimuli   Attention Span:  Attends with cues to redirect   Memory:  Decreased short term memory   Following Commands:  Follows one step commands with repetition   Safety Judgment:  Decreased awareness of need for safety, Impulsivity   Awareness of Errors and Problem Solving:  Assistance required to identify errors made, Assistance required to generate solutions, Assistance required to implement solutions   Comments:      Vision / Hearing   Vision: Wears glasses all the time, Glasses present         Hearing: Daughter reports pt needs hearing aids     Hand Function:  Right Hand Function: Right hand function impaired  Right Hand Impairment: grip strength poor  Left Hand Function: Left hand function impaired  Left Hand Impairment: grip strength poor  Hand Dominance: Right    Skin Inspection:  Skin Inspection: Intact where visualized      ROM / Strength:  UE ROM/Strength: Left Impaired/Limited, Right Impaired/Limited  RUE Impairment: Reduced strength, Limited AROM  LUE Impairment: Reduced strength, Limited AROM  UE ROM/ Strength Comment: trace B shoulder movement, B elbow AROM grossly 3/5  LE ROM/Strength: Left Impaired/Limited, Right Impaired/Limited  RLE Impairment: Reduced strength, Limited AROM  LLE Impairment: Reduced strength, Limited AROM    Coordination:  Coordination: Not tested    Sensation:  RUE Sensation: RUE impaired  RUE Sensation Impairment: Numbness  LUE Sensation: LUE impaired  LUE Sensation Impairment: Numbness  RLE Sensation: RLE impaired  RLE Sensation Impairment: Numbness  LLE Sensation: LLE impaired  LLE Sensation Impairment: Numbness    Balance:  Static Sitting-Level of Assistance: Maximum assistance  Dynamic Sitting-Level of Assistance: Maximum assistance  Sitting Balance comments: fluctuating from max-CGA, heavy posterior and right lean; sitting EOB tolerance=~15 minutes    Standing Balance comments: NT    Functional Mobility  Transfers:  (NT 2/2 fatigue)  Bed Mobility - Needs Assistance: Max assist, +2 assist (supine<>sit)    ADLs  ADLs: Total Assistance         Patient at end of session: All needs in reach, Friends/Family present, In bed, Lines intact     Occupational Therapy Session Duration  OT Individual [mins]: 10  OT Co-Treatment [mins]: 26 (Jenn PT)  Reason for Co-treatment: To safely progress mobility, Poor activity tolerance, Requires heavy assist for safety       AM-PAC-Daily Activity  Lower Body Dressing assistance needs: Unable to do/total assistance - Total Dependent Assist  Bathing assistance needs: Unable to do/total assistance - Total Dependent Assist  Toileting assistance needs: Unable to do/total assistance - Total Dependent Assist  Upper Body Dressing assistance needs: Unable to do/total assistance - Total Dependent Assist  Personal Grooming assistance needs: Unable to do/total assistance - Total Dependent Assist  Eating Meals assistance needs: Unable to do/total assistance - Total Dependent Assist    Daily Activity Score: 6    Score (in points): % of Functional Impairment, Limitation, Restriction  6: 100% impaired, limited, restricted  7-8: At least 80%, but less than 100% impaired, limited restricted  9-13: At least 60%, but less than 80% impaired, limited restricted  14-19: At least 40%, but less than 60% impaired, limited restricted  20-22: At least 20%, but less than 40% impaired, limited restricted  23: At least 1%, but less than 20% impaired, limited restricted  24: 0% impaired, limited restricted      I attest that I have reviewed the above information.  Signed: Virgina Evener, OT  Filed 03/10/2024

## 2024-03-10 NOTE — Unmapped (Signed)
 Received patient on HFNC 60L 35% at the beginning of the shift. Attempted to wean patient for CT scan trip. Patient tolerated well but when laying flat to move to stretcher, patient began vomiting x 3. As a result CT trip was cancelled and patient now requires 60L 60% on the HFNC

## 2024-03-11 LAB — CBC
HEMATOCRIT: 23.6 % — ABNORMAL LOW (ref 34.0–44.0)
HEMATOCRIT: 22.2 % — ABNORMAL LOW (ref 34.0–44.0)
HEMOGLOBIN: 7.7 g/dL — ABNORMAL LOW (ref 11.3–14.9)
HEMOGLOBIN: 7.1 g/dL — ABNORMAL LOW (ref 11.3–14.9)
MEAN CORPUSCULAR HEMOGLOBIN CONC: 32.6 g/dL (ref 32.0–36.0)
MEAN CORPUSCULAR HEMOGLOBIN CONC: 32.1 g/dL (ref 32.0–36.0)
MEAN CORPUSCULAR HEMOGLOBIN: 27.4 pg (ref 25.9–32.4)
MEAN CORPUSCULAR HEMOGLOBIN: 27.2 pg (ref 25.9–32.4)
MEAN CORPUSCULAR VOLUME: 84.2 fL (ref 77.6–95.7)
MEAN CORPUSCULAR VOLUME: 84.9 fL (ref 77.6–95.7)
MEAN PLATELET VOLUME: 9.8 fL (ref 6.8–10.7)
MEAN PLATELET VOLUME: 10.1 fL (ref 6.8–10.7)
PLATELET COUNT: 192 10*9/L (ref 150–450)
PLATELET COUNT: 206 10*9/L (ref 150–450)
RED BLOOD CELL COUNT: 2.81 10*12/L — ABNORMAL LOW (ref 3.95–5.13)
RED BLOOD CELL COUNT: 2.62 10*12/L — ABNORMAL LOW (ref 3.95–5.13)
RED CELL DISTRIBUTION WIDTH: 17.8 % — ABNORMAL HIGH (ref 12.2–15.2)
RED CELL DISTRIBUTION WIDTH: 17.6 % — ABNORMAL HIGH (ref 12.2–15.2)
WBC ADJUSTED: 22.5 10*9/L — ABNORMAL HIGH (ref 3.6–11.2)
WBC ADJUSTED: 23.8 10*9/L — ABNORMAL HIGH (ref 3.6–11.2)

## 2024-03-11 LAB — APTT
APTT: 40.5 s — ABNORMAL HIGH (ref 24.8–38.4)
APTT: 48.2 s — ABNORMAL HIGH (ref 24.8–38.4)
HEPARIN CORRELATION: 0.2
HEPARIN CORRELATION: 0.3

## 2024-03-11 LAB — BLOOD GAS CRITICAL CARE PANEL, ARTERIAL
BASE EXCESS ARTERIAL: -0.5 (ref -2.0–2.0)
BASE EXCESS ARTERIAL: -0.7 (ref -2.0–2.0)
BASE EXCESS ARTERIAL: -2.8 — ABNORMAL LOW (ref -2.0–2.0)
CALCIUM IONIZED ARTERIAL (MG/DL): 4.31 mg/dL — ABNORMAL LOW (ref 4.40–5.40)
CALCIUM IONIZED ARTERIAL (MG/DL): 4.63 mg/dL (ref 4.40–5.40)
CALCIUM IONIZED ARTERIAL (MG/DL): 4.41 mg/dL (ref 4.40–5.40)
GLUCOSE WHOLE BLOOD: 124 mg/dL (ref 70–179)
GLUCOSE WHOLE BLOOD: 146 mg/dL (ref 70–179)
GLUCOSE WHOLE BLOOD: 162 mg/dL (ref 70–179)
HCO3 ARTERIAL: 24 mmol/L (ref 22–27)
HCO3 ARTERIAL: 26 mmol/L (ref 22–27)
HCO3 ARTERIAL: 22 mmol/L (ref 22–27)
HEMOGLOBIN BLOOD GAS: 7.3 g/dL — ABNORMAL LOW (ref 12.00–16.00)
HEMOGLOBIN BLOOD GAS: 7.9 g/dL — ABNORMAL LOW (ref 12.00–16.00)
HEMOGLOBIN BLOOD GAS: 7 g/dL — ABNORMAL LOW (ref 12.00–16.00)
LACTATE BLOOD ARTERIAL: 0.8 mmol/L (ref <1.3)
LACTATE BLOOD ARTERIAL: 0.7 mmol/L (ref <1.3)
LACTATE BLOOD ARTERIAL: 0.8 mmol/L (ref <1.3)
O2 SATURATION ARTERIAL: 80.3 % — ABNORMAL LOW (ref 94.0–100.0)
O2 SATURATION ARTERIAL: 85.8 % — ABNORMAL LOW (ref 94.0–100.0)
O2 SATURATION ARTERIAL: 98.5 % (ref 94.0–100.0)
PCO2 ARTERIAL: 38.2 mmHg (ref 35.0–45.0)
PCO2 ARTERIAL: 49.8 mmHg — ABNORMAL HIGH (ref 35.0–45.0)
PCO2 ARTERIAL: 35.6 mmHg (ref 35.0–45.0)
PH ARTERIAL: 7.41 (ref 7.35–7.45)
PH ARTERIAL: 7.32 — ABNORMAL LOW (ref 7.35–7.45)
PH ARTERIAL: 7.4 (ref 7.35–7.45)
PO2 ARTERIAL: 46.9 mmHg — ABNORMAL LOW (ref 80.0–110.0)
PO2 ARTERIAL: 59.1 mmHg — ABNORMAL LOW (ref 80.0–110.0)
PO2 ARTERIAL: 117 mmHg — ABNORMAL HIGH (ref 80.0–110.0)
POTASSIUM WHOLE BLOOD: 3.8 mmol/L (ref 3.4–4.6)
POTASSIUM WHOLE BLOOD: 4 mmol/L (ref 3.4–4.6)
POTASSIUM WHOLE BLOOD: 4.1 mmol/L (ref 3.4–4.6)
SODIUM WHOLE BLOOD: 138 mmol/L (ref 135–145)
SODIUM WHOLE BLOOD: 138 mmol/L (ref 135–145)
SODIUM WHOLE BLOOD: 136 mmol/L (ref 135–145)

## 2024-03-11 LAB — PHOSPHORUS
PHOSPHORUS: 4.5 mg/dL (ref 2.4–5.1)
PHOSPHORUS: 5.1 mg/dL (ref 2.4–5.1)

## 2024-03-11 LAB — BASIC METABOLIC PANEL
ANION GAP: 14 mmol/L (ref 5–14)
ANION GAP: 16 mmol/L — ABNORMAL HIGH (ref 5–14)
BLOOD UREA NITROGEN: 42 mg/dL — ABNORMAL HIGH (ref 9–23)
BLOOD UREA NITROGEN: 46 mg/dL — ABNORMAL HIGH (ref 9–23)
BUN / CREAT RATIO: 27
BUN / CREAT RATIO: 28
CALCIUM: 8.1 mg/dL — ABNORMAL LOW (ref 8.7–10.4)
CALCIUM: 7.8 mg/dL — ABNORMAL LOW (ref 8.7–10.4)
CHLORIDE: 100 mmol/L (ref 98–107)
CHLORIDE: 99 mmol/L (ref 98–107)
CO2: 25 mmol/L (ref 20.0–31.0)
CO2: 22 mmol/L (ref 20.0–31.0)
CREATININE: 1.53 mg/dL — ABNORMAL HIGH (ref 0.55–1.02)
CREATININE: 1.63 mg/dL — ABNORMAL HIGH (ref 0.55–1.02)
EGFR CKD-EPI (2021) FEMALE: 34 mL/min/{1.73_m2} — ABNORMAL LOW (ref >=60)
EGFR CKD-EPI (2021) FEMALE: 32 mL/min/{1.73_m2} — ABNORMAL LOW (ref >=60)
GLUCOSE RANDOM: 135 mg/dL (ref 70–179)
GLUCOSE RANDOM: 170 mg/dL — ABNORMAL HIGH (ref 70–99)
POTASSIUM: 4 mmol/L (ref 3.4–4.8)
POTASSIUM: 4.5 mmol/L (ref 3.4–4.8)
SODIUM: 139 mmol/L (ref 135–145)
SODIUM: 137 mmol/L (ref 135–145)

## 2024-03-11 LAB — MAGNESIUM
MAGNESIUM: 2.3 mg/dL (ref 1.6–2.6)
MAGNESIUM: 2.2 mg/dL (ref 1.6–2.6)

## 2024-03-11 LAB — TRIGLYCERIDES: TRIGLYCERIDES: 73 mg/dL (ref 0–150)

## 2024-03-11 MED ADMIN — sodium chloride (NS) 0.9 % flush 10 mL: 10 mL | INTRAVENOUS | @ 18:00:00

## 2024-03-11 MED ADMIN — ROCuronium (ZEMURON) injection: INTRAVENOUS | @ 08:00:00 | Stop: 2024-03-11

## 2024-03-11 MED ADMIN — Propofol (DIPRIVAN) 10 mg/mL injection: INTRAVENOUS | @ 09:00:00 | Stop: 2024-03-11

## 2024-03-11 MED ADMIN — Propofol (DIPRIVAN) injection: INTRAVENOUS | @ 08:00:00 | Stop: 2024-03-11

## 2024-03-11 MED ADMIN — dextrose (D10W) 10% bolus 125 mL: 12.5 g | INTRAVENOUS | @ 01:00:00 | Stop: 2025-02-26

## 2024-03-11 MED ADMIN — NORepinephrine 8 mg in dextrose 5 % 250 mL (32 mcg/mL) infusion PMB: 0-10 ug/min | INTRAVENOUS | @ 01:00:00

## 2024-03-11 MED ADMIN — pantoprazole (Protonix) injection 40 mg: 40 mg | INTRAVENOUS | @ 17:00:00

## 2024-03-11 MED ADMIN — propofol (DIPRIVAN) infusion 10 mg/mL: 0-30 ug/kg/min | INTRAVENOUS | @ 22:00:00

## 2024-03-11 MED ADMIN — heparin (porcine) 1000 unit/mL injection 2,000 Units: 2000 [IU] | INTRAVENOUS | @ 12:00:00

## 2024-03-11 MED ADMIN — heparin 25,000 Units/250 mL (100 units/mL) in 0.45% saline infusion (premade): 0-24 [IU]/kg/h | INTRAVENOUS | @ 15:00:00

## 2024-03-11 MED ADMIN — ipratropium (ATROVENT) 0.02 % nebulizer solution 500 mcg: 500 ug | RESPIRATORY_TRACT | @ 19:00:00

## 2024-03-11 MED ADMIN — vasopressin 20 units in 100 mL (0.2 units/mL) infusion premade vial: .03 [IU]/min | INTRAVENOUS | @ 17:00:00 | Stop: 2024-03-11

## 2024-03-11 MED ADMIN — fentaNYL (PF) (SUBLIMAZE) injection 25 mcg: 25 ug | INTRAVENOUS | @ 16:00:00 | Stop: 2024-03-25

## 2024-03-11 MED ADMIN — vasopressin 0.2 unit/mL: INTRAVENOUS | @ 17:00:00 | Stop: 2024-03-11

## 2024-03-11 MED ADMIN — meropenem (MERREM) 500 mg in sodium chloride 0.9 % (NS) 100 mL IVPB-MBP: 500 mg | INTRAVENOUS | @ 10:00:00 | Stop: 2024-03-11

## 2024-03-11 MED ADMIN — ceftolozane-tazobactam (ZERBAXA) 750 mg in sodium chloride (NS) 0.9 % 100 mL IVPB: 750 mg | INTRAVENOUS | @ 23:00:00 | Stop: 2024-03-18

## 2024-03-11 MED ADMIN — acetaminophen (TYLENOL) tablet 650 mg: 650 mg | GASTROENTERAL | @ 03:00:00

## 2024-03-11 MED ADMIN — ondansetron (ZOFRAN) injection 4 mg: 4 mg | INTRAVENOUS | @ 16:00:00

## 2024-03-11 MED ADMIN — lactated ringers bolus 500 mL: 500 mL | INTRAVENOUS | @ 17:00:00 | Stop: 2024-03-11

## 2024-03-11 MED ADMIN — sodium chloride (NS) 0.9 % flush 10 mL: 10 mL | INTRAVENOUS | @ 02:00:00

## 2024-03-11 MED ADMIN — sodium chloride (NS) 0.9 % flush 10 mL: 10 mL | INTRAVENOUS | @ 01:00:00

## 2024-03-11 MED ADMIN — acetaminophen (TYLENOL) tablet 650 mg: 650 mg | GASTROENTERAL | @ 23:00:00

## 2024-03-11 MED ADMIN — propofol (DIPRIVAN) infusion 10 mg/mL: 0-30 ug/kg/min | INTRAVENOUS | @ 09:00:00

## 2024-03-11 MED ADMIN — phenylephrine (NEO-SYNEPHRINE) injection: INTRAVENOUS | @ 08:00:00 | Stop: 2024-03-11

## 2024-03-11 MED ADMIN — iohexol (OMNIPAQUE) 240 mg iodine/mL oral solution 50 mL: 50 mL | ORAL | @ 18:00:00 | Stop: 2024-03-11

## 2024-03-11 MED ADMIN — insulin regular (HumuLIN,NovoLIN) injection 0-20 Units: 0-20 [IU] | SUBCUTANEOUS | @ 23:00:00

## 2024-03-11 NOTE — Unmapped (Signed)
 RT assisted with intubation ! Prior patient was hyper-oxygenated ! ETT 7.5  is secured at 19 @ lip , Positive color change and bilateral breath sounds noted. CXR pending. Placed on ventilator. ABG to follow.

## 2024-03-11 NOTE — Unmapped (Signed)
 [facility]U PROGRESS NOTE     Date of Service: 03/11/2024    Hospital Day: LOS: 13 days        Surgery Date: TBD   Surgical Attending: Aris Georgia, MD    Critical Care Attending: Thalia Party, MD    Interval History:   Norepi up to 10 this morning. Vaso added. 500cc LR bolused. Vigileo started. ECHO ordered. ID consulted for MDR Pseudomonas. C/f sepsis shock. Non CT Chest and A/P to look for alternative sources of infection apart from aspiration PNA. ID consulted for MDR Pseudomonas. Pending recommendations. Low threshold to start Micafungin at this time.     History of Present Illness:   Tammy Braun is a 83 y.o. female with PMH bladder cancer with cystectomy and ileal conduit, COPD, recent dx of afib, moderate-severe TR, CAD (high calcification score), T2DM presented to [facility] with projectile vomiting and abdominal pain, found to have SBO with transition point at level of ileostomy on CT A/P. On initial presentation, labs remarkable for leukocytosis thought to be iso hemoconcentration, AKI on CKD, mildly elevated venous lactate improved with resuscitation. Admitted to [facility] for worsening hypoxia on HFNC requiring intubation in setting of hiatal hernia. OR on 02/28/24 for ex-lap, reduciton of parastomal hernia, small bowel resection with primary anastomosis, lysis of adhesions, abdomen left open with ABThera in place.     Hospital Course:  - 02/26/24: Admitted to [facility], floor status, consult to urology for possible ileal conduit revision.   - 02/27/24: Patient level of care escalated to step-down iso increased oxygen needs while in ED.  - 02/28/24: Upgraded to ICU. Intubated at bed-side. OR for ex-lap, reduction of parastomal hernia, small bowel resection with primary anastomosis, lysis of adhesions, abdomen left open with ABThera in place.   - 03/01/24: OR for attempt at abdominal closure. Left open due to desaturations during closure attempt. ABThera in place.  - 03/03/24: RTOR for abdominal closure  -03/06/24: Vas cath placed, CRRT started   -03/09/24: Extubated to HFNC  -03/10/24: CRRT discontinued      ASSESSMENT & PLAN:     Neurologic:  - Pain: Oxy PRN, tylenol [facility]    Cardiovascular:  - MAP goal > 65  - NE up to 10, Vaso added  - Vigileo  - ECHO-TAPS- Pending    #Hx of HTN  -holding home amlodipine    #Hx of HLD  - atorvastatin    #Afib/flutter w RVR:   - Prior hx of paroxysmal afib. CHADSvasc = 6, no AC PTA per chart review.      > Holding home diltiazem, question efficacy given likely poor absorption  - amio 200mg  PO daily, load ended 3/10  - heparin gtt  - Echo 3/1: LVEF > 55%. Severe TR.  - Pro-BNP: 6,988 on 3/3     #Pulmonary embolism  - 3/5 CTA chest: Filling defect in L anterior segmental artery  - heparin gtt     #Acute thrombus right basilic   - Identified 3/3 on BUE duplex  - Supportive care, superficial, AC not required     Respiratory:  #Acute on chronic hypoxemia  #Hx of COPD  -L Lung white out on CXR overnight. Reintubated, bronched 3/12  -Bronch: Mucous plugging (white thick plugs on L side, collapsible fragile R lobe)  - Aggressive pulmonary toilet: bed precussion, albuterol/3%  - Continue incruse ellipta   Vent Mode: PSV-CPAP  FiO2 (%): 40 %  S RR: 15  S VT: 300 mL  PEEP: 10 cm H20  PR SUP: 8 cm H20     #Pseudomonas Pneumonia   #VAP  - see ID for abx plan    FEN/GI:  F: 500cc LR bolus  E: Replete electrolytes PRN  N: NPO   - TF held due to 2 pressor shock  - GI prophylaxis: pepcid  - bowel regimen: resume senna and miralax   Last BM: 3/12    #S/p ex-lap, reduction of parastomal hernia, small bowel resection with primary anastomosis, lysis of adhesions  - 3/7 SBO, resolved    #Large hernia, chronic  - Non-operative, chronic  - re-eval on CT chest/abd/pelvis 3/7 stable    Renal/Genitourinary:  #AKI on CKD  #uremia   -Nephrology consulted  - Cr uptrending  Serum creatinine: 1.53 mg/dL (H) 45/40/98 1191  Estimated creatinine clearance: 25.4 mL/min (A)   - RUS : Bilateral renal cysts measuring up to 2.6 cm in the right upper pole and 1.9 cm left interpolar kidney   - CRRT discontinued 3/10. Leave in [place], will need dialyzed in the future.    > Nephro recommended 4mg  bumex and 200 Diuril. Will hold off on following recs at this time iso requiring 2 pressors.  - No urgent HD needs at this time 3/12 per Nephro, no electrolyte abnormality at this time    #H/o bladder cancer s/p radical cystectomy with ileal conduit (2012):  #Parastomal hernia:  - Strict I&Os  - Foley catheter per stoma, if it falls out, please notify Urology to replace      Endocrine:   #hyperglycemia   - Glycemic Control: SSI, 10 units NPH BID- continue holding while TF are held    Hematologic:   - Monitor CBC daily, transfuse for hgb 7mg /dL  Recent Labs     47/82/95  0355 03/11/24  0309   HGB 8.0* 7.7*      - DVT ppx: heparin gtt    Immunologic/Infectious Disease:  - Afebrile  - Persistent leukocytosis, continue to monitor   Recent Labs     03/10/24  0355 03/11/24  0309   WBC 22.6* 22.5*        - Cultures:   - Ucx (2/26): Mixed GP/GN organisms    - RPP (2/27): negative   - Blood cultures (3/4): NGTD @72h    - BAL (3/4): Pseudomonas aeruginosa   - BAL (3/8): Pseudomonas aeruginosa and candida glabrata  -3/12: Susceptibilities show resistance to Meropenum, Cefepime, Ceftaz and Zosyn.   -ID consulted, pending recs  - Antimicrobials:    - Ceftriaxone 2/27- 2/28 - colonized UTI   - Zosyn (3/1 - 3/5) for abdominal contamination  - Zosyn (3/6) for pseudomonas when sensitivities pending  - Meropenum stopped (3/6-3/12) for resistant Pseudomonas PNA   - Zerbaxa started 3/12 for 7-10 day course    Musculoskeletal:   #Deconditioned   - PT/OT consulted     Daily Care Checklist:   - Stress Ulcer Prevention: No  - DVT Prophylaxis: Chemical: Heparin drip  - Daily Awakening: N/A  - Spontaneous Breathing Trial: Yes  - Indication for Central/PICC Line: Yes  Infusions requiring central access, Hemodynamic monitoring, and Inadequate peripheral access, RIJ triple lumen  - Indication for Urinary Catheter: Yes  Strict intake and output, Agressive diuresis/hydration, and Urinary retention/obstruction  - Diagnostic images/reports of past 24hrs reviewed: Yes    Disposition:   - Continue ICU care  - PT/OT consulted:yes     SUBJECTIVE:    Alert and appropriate. Follows commands. Denies pain.     OBJECTIVE:  Physical Exam:  Constitutional: Lying supine, NAD  Neurologic: Opens eyes to voice. Moves all 4 extremities to command. Pupils equal and responsive to light.   Respiratory: Mechanically ventilated, diminished bilaterally  Cardiovascular: Bradycardic, regular rhythm s1, s2  Gastrointestinal: Soft abdomen, midline incision staples in tact. Urostomy with Foley in stoma draining clear yellow urine.   Musculoskeletal: Trace BLE/BUE edema.  Skin: Warm and dry     Temp:  [36.5 ??C (97.7 ??F)-36.9 ??C (98.4 ??F)] 36.9 ??C (98.4 ??F)  Heart Rate:  [57-74] 69  SpO2 Pulse:  [53-74] 68  Resp:  [15-34] 34  BP: (90-145)/(38-106) 131/106  MAP (mmHg):  [54-114] 114  A BP-1: (49-155)/(36-65) 133/46  MAP:  [47 mmHg-94 mmHg] 67 mmHg  FiO2 (%):  [35 %-100 %] 100 %  SpO2:  [83 %-100 %] 96 %     Recent Laboratory Results:  Recent Labs     Units 03/11/24  0441   PHART  7.32*   PCO2ART mm Hg 49.8*   PO2ART mm Hg 59.1*   HCO3ART mmol/L 26   BEART  -0.7   O2SATART % 85.8*     Recent Labs     Units 03/10/24  0355 03/10/24  1524 03/11/24  0245 03/11/24  0309 03/11/24  0441   NA mmol/L 136 - 135 138 138 139 138   K mmol/L 3.4 - 3.4 4.5 3.8 4.0 4.0   CL mmol/L 99 98  --  100  --    CO2 mmol/L 26.0 25.0  --  25.0  --    BUN mg/dL 33* 39*  --  42*  --    CREATININE mg/dL 1.61* 0.96*  --  0.45*  --    GLU mg/dL 409* 811*  --  914  --      Lab Results   Component Value Date    BILITOT 0.2 (L) 03/08/2024    BILITOT 0.2 (L) 03/03/2024    BILIDIR 0.10 03/08/2024    BILIDIR 0.10 03/03/2024    ALT <7 (L) 03/08/2024    ALT 9 (L) 03/03/2024    AST 10 03/08/2024    AST 22 03/03/2024    GGT 16 09/17/2011    GGT 354 (H) 05/03/2011 ALKPHOS 53 03/08/2024    ALKPHOS 58 03/03/2024    PROT 5.4 (L) 03/08/2024    PROT 5.5 (L) 03/03/2024    ALBUMIN 2.0 (L) 03/08/2024    ALBUMIN 2.4 (L) 03/03/2024     Recent Labs     Units 03/10/24  2146 03/10/24  2152 03/11/24  0031 03/11/24  0531   POCGLU mg/dL 782 956 92 213     Recent Labs     Units 03/09/24  0401 03/10/24  0353 03/10/24  0355 03/11/24  0309   WBC 10*9/L 25.6*  --  22.6* 22.5*   RBC 10*12/L 3.01*  --  2.93* 2.81*   HGB g/dL 8.4*  --  8.0* 7.7*   HCT % 25.2*  --  24.4* 23.6*   MCV fL 84.0  --  83.4 84.2   MCH pg 27.8  --  27.4 27.4   MCHC g/dL 08.6  --  57.8 46.9   RDW % 17.6*  --  17.9* 17.8*   PLT 10*9/L 161  --  149* 192   MPV fL 9.1 Not Detected 9.6 9.8     Recent Labs     Units 03/10/24  1139   APTT sec 46.9*      Lines & Tubes:  Patient Lines/Drains/Airways Status       Active Peripheral & Central Intravenous Access       Name Placement date Placement time Site Days    Peripheral IV 03/04/24 Anterior;Proximal;Right Forearm 03/04/24  0021  Forearm  7    CVC Triple Lumen 03/07/24 Non-tunneled Left Internal jugular 03/07/24  0849  Internal jugular  3    Hemodialysis Catheter With Distal Infusion Port 03/06/24 Right Internal jugular 1.2 mL 1.2 mL 03/06/24  2301  Internal jugular  4                     Patient Lines/Drains/Airways Status       Active Wounds       Name Placement date Placement time Site Days    Surgical Site 04/08/18 Shoulder Left 04/08/18  1338  -- 2163    Surgical Site 06/05/22 Eye Left 06/05/22  0928  -- 644    Surgical Site 06/19/22 Eye Right 06/19/22  1024  -- 630    Surgical Site 03/03/24 Abdomen 03/03/24  1013  -- 7    Wound 06/24/20 Other (comment) Buttocks inner right buttock 06/24/20  1010  Buttocks  1355                     Respiratory/ventilator settings for last 24 hours:   Vent Mode: PCV  FiO2 (%): 100 %  S RR: 18  PEEP: 10 cm H20    Intake/Output last 3 shifts:  I/O last 3 completed shifts:  In: 4804.1 [I.V.:1139.9; NG/GT:1830; IV Piggyback:1197.5]  Out: 2266 [Urine:290; Drains:300; Other:1676]    Daily/Recent Weight:  68 kg (149 lb 14.6 oz)    BMI:  Body mass index is 28.34 kg/m??.    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    Chronic kidney disease     Depression, psychotic (CMS-HCC)     Diabetes mellitus (CMS-HCC)     in past    Emphysema of lung (CMS-HCC)     Financial difficulties     Frail elderly     Hearing impairment     Hernia     History of transfusion     Hyperlipidemia     Hypertension     Impaired mobility     Osteoporosis     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/08/2015    Visual impairment      Past Surgical History:   Procedure Laterality Date    ABDOMINAL SURGERY      BLADDER SURGERY      BREAST CYST EXCISION      CHEMOTHERAPY  2012    bladder    GALLBLADDER SURGERY      stone removal    ILEOSTOMY  2012    PR COLONOSCOPY FLX DX W/COLLJ SPEC WHEN PFRMD N/A 02/09/2015    Procedure: COLONOSCOPY, FLEXIBLE, PROXIMAL TO SPLENIC FLEXURE; DIAGNOSTIC, W/WO COLLECTION SPECIMEN BY BRUSH OR WASH;  Surgeon: Dewaine Conger, MD;  Location: [facility];  Service: Gastroenterology    PR EXPLORATORY OF ABDOMEN N/A 02/28/2024    Procedure: EXPLORATORY LAPAROTOMY, EXPLORATORY CELIOTOMY WITH OR WITHOUT BIOPSY(S);  Surgeon: Renda Rolls, MD;  Location: [facility];  Service: General Surgery  PR RECONSTR TOTAL SHOULDER IMPLANT Left 04/08/2018    Procedure: ARTHROPLASTY, GLENOHUMERAL JOINT; TOTAL SHOULDER(GLENOID & PROXIMAL HUMERAL REPLACEMENT(EG, TOTAL SHOULDER);  Surgeon: Tomasa Rand, MD;  [facility];  Service: Ortho Sports Medicine    PR REOPEN RECENT ABD EXPLORATORY Midline 03/01/2024    Procedure: REOPENING OF RECENT LAPAROTOMY;  Surgeon: [name]., MD;  Location: OR [facility];  Service: Trauma    PR REOPEN RECENT ABD EXPLORATORY N/A 03/03/2024 Procedure: REOPENING OF RECENT LAPAROTOMY;  Surgeon: Joanie Coddington, MD;  Location: OR [facility];  Service: Trauma    PR SIGMOIDOSCOPY,BIOPSY N/A 03/11/2015    Procedure: SIGMOIDOSCOPY, FLEXIBLE; WITH BIOPSY, SINGLE OR MULTIPLE;  Surgeon: Wilburt Finlay, MD;  [facility];  Service: Gastroenterology    PR XCAPSL CTRC RMVL INSJ IO LENS PROSTH W/O ECP Left 06/05/2022    Procedure: EXTRACAPSULAR CATARACT REMOVAL W/INSERTION OF INTRAOCULAR LENS PROSTHESIS, MANUAL OR MECHANICAL TECHNIQUE WITHOUT ENDOSCOPIC CYCLOPHOTOCOAGULATION;  Surgeon: Garner Gavel, MD;  Location: [facility] OR [facility];  Service: Ophthalmology    PR XCAPSL CTRC RMVL INSJ IO LENS PROSTH W/O ECP Right 06/19/2022    Procedure: EXTRACAPSULAR CATARACT REMOVAL W/INSERTION OF INTRAOCULAR LENS PROSTHESIS, MANUAL OR MECHANICAL TECHNIQUE WITHOUT ENDOSCOPIC CYCLOPHOTOCOAGULATION;  Surgeon: Garner Gavel, MD;  Location: [facility] OR [facility];  Service: Ophthalmology     Scheduled Medications:   [Provider Hold] acetaminophen  650 mg Enteral tube: gastric Q6H    amiodarone  150 mg Intravenous daily    [Provider Hold] aspirin  81 mg Enteral tube: gastric Daily    [Provider Hold] atorvastatin  40 mg Enteral tube: gastric Daily    flu vacc [id number](36yr up)-PF  0.5 mL Intramuscular During hospitalization    [START ON 03/14/2024] fluPHENAZine decanoate  50 mg Intramuscular Q30 Days    insulin regular  0-20 Units Subcutaneous Q6H SCH    meropenem  500 mg Intravenous Q8H    [Provider Hold] polyethylene glycol  17 g Enteral tube: gastric Daily    [Provider Hold] sennosides  2.5 mL Enteral tube: gastric Nightly    sodium chloride  10 mL Intravenous Q8H    sodium chloride  10 mL Intravenous Q8H    sodium chloride  10 mL Intravenous Q8H    umeclidinium  1 puff Inhalation Daily (RT)     Continuous Infusions:   heparin 14 Units/kg/hr (03/11/24 0213)    NORepinephrine bitartrate-NS Stopped (03/11/24 0519)    NxStage RFP 400 (+/- BB) 5000 mL - contains 2 mEq/L of potassium      NxStage/multiBic RFP 401 (+/- BB) 5000 mL - contains 4 mEq/L of potassium      propofol 10 mg/mL infusion 10 mcg/kg/min (03/11/24 0430)     PRN Medications:  albuterol, [Provider Hold] cyclobenzaprine, dextrose in water, fentaNYL (PF) **OR** fentaNYL (PF), heparin (porcine), heparin (porcine), nicotine polacrilex **OR** nicotine polacrilex, ondansetron, oxyCODONE **OR** oxyCODONE    Fany Cavanaugh is critically ill due to acute kidney injury, altered mental status, atrial fibrillation, bradycardia, coagulopathy, hypotension, pneumonia, pulmonary embolism, and renal insufficiency,    . This critical care time includes examining the patient, evaluating the hemodynamic, laboratory, and radiographic data, independently developing a comprehensive management plan, and serially assessing the patient's response to these critical care interventions. This critical care time excludes procedures.    Critical care time: 90 minutes     Armandina Gemma, ACNP   Surgical Intensive Care Unit  [place] [facility] [place] [facility]

## 2024-03-11 NOTE — Unmapped (Signed)
 ST[facility]U BRONCHOSCOPY NOTE     DATE OF SERVICE: 03/11/2024     PROCEDURE:  BRONCHOSCOPY     INDICATION:  Hypoxia and Abnormal CXR    DIAGNOSIS:  Mucus Plug     CONSENT:   yes    TIME OUT:   Yes, correct patient, side, site, procedure, position, equipment               ANESTHESIA:  Endotracheal lidocaine and Already sedated with propofol/fentanyl    DESCRIPTION:        Endotracheal lidocaine was applied.  The patient was positioned appropriately and the bronchoscope was passed through the ETT. The scope was then passed into the trachea. Careful inspection of the tracheal lumen was accomplished.  The scope was sequentially passed into the left main and then left upper and lower bronchi and segmental bronchi.  The scope was then withdrawn and advanced into the right main bronchus and then into the RUL, RML, and RLL bronchi and segmental bronchi. Therapeutic aspiration was performed to remove mucous plugs from Left main stem, LUL and LLL.  The bronchial lumens were patent after the intervention.     FINDINGS:   Mucosa: Edematous mucosa, Collapse with mucus plugging, and Collapse     Secretions: Copious (significant amount of secretions)  Thick white            SPECIMENS SENT:  BAL from right main stem bronchus and left main stem bronchus    COMPLICATIONS:  no complications were noted    POST-PROCEDURE DIAGNOSIS:  Same    ATTESTATION:  I performed the procedure     SIGNATURE:             Katina Degree, ACNP

## 2024-03-11 NOTE — Unmapped (Signed)
 Pt remains on vent at this time tolerating well. ET-tube taped throughout the shift, airway remains patent, secure, and no breakdown noted. Alarms are set, visible, and audible at this time, and emergency equipment is at the bedside. Will continue to monitor closely. Pt transported to CT this shift with no complications.

## 2024-03-11 NOTE — Unmapped (Signed)
 Patient is intubated and sedated and is able to follow commands. She is on levo and vasopressin. She received a 500 bolus of LR. Patient became slightly bradycardic in the 50s this evening, EKG was completed. She has thick respiratory secretions, tolerates suction well. She is on a heparin drip that is currently therapeutic. Patient received CT with contrast this afternoon. NGT is to wall suction. Ileal conduit with foley output is minimal. JP drains output remains consistent. She had two Bms this afternoon. Family is at bedside.    Problem: Adult Inpatient Plan of Care  Goal: Plan of Care Review  Outcome: Ongoing - Unchanged  Goal: Patient-Specific Goal (Individualized)  Outcome: Ongoing - Unchanged  Goal: Absence of Hospital-Acquired Illness or Injury  Outcome: Ongoing - Unchanged  Intervention: Identify and Manage Fall Risk  Recent Flowsheet Documentation  Taken 03/11/2024 1800 by Danae Orleans, RN  Safety Interventions: aspiration precautions  Taken 03/11/2024 1600 by Danae Orleans, RN  Safety Interventions: aspiration precautions  Taken 03/11/2024 1400 by Danae Orleans, RN  Safety Interventions: aspiration precautions  Taken 03/11/2024 1200 by Danae Orleans, RN  Safety Interventions: aspiration precautions  Taken 03/11/2024 1000 by Danae Orleans, RN  Safety Interventions:   aspiration precautions   bed alarm   fall reduction program maintained   family at bedside  Taken 03/11/2024 0800 by Danae Orleans, RN  Safety Interventions:   aspiration precautions   bed alarm   fall reduction program maintained   family at bedside   low bed  Intervention: Prevent Skin Injury  Recent Flowsheet Documentation  Taken 03/11/2024 1800 by Danae Orleans, RN  Positioning for Skin: Right  Taken 03/11/2024 1600 by Danae Orleans, RN  Positioning for Skin: Left  Taken 03/11/2024 1400 by Danae Orleans, RN  Positioning for Skin: Right  Taken 03/11/2024 1200 by Danae Orleans, RN  Positioning for Skin: Left  Taken 03/11/2024 1000 by Danae Orleans, RN  Positioning for Skin: Right  Taken 03/11/2024 0800 by Danae Orleans, RN  Positioning for Skin: Right  Device Skin Pressure Protection:   absorbent pad utilized/changed   adhesive use limited   tubing/devices free from skin contact   positioning supports utilized  Skin Protection:   adhesive use limited   incontinence pads utilized   tubing/devices free from skin contact  Intervention: Prevent and Manage VTE (Venous Thromboembolism) Risk  Recent Flowsheet Documentation  Taken 03/11/2024 1800 by Danae Orleans, RN  Anti-Embolism Device Type: SCD, Knee  Anti-Embolism Device Status: On  Anti-Embolism Device Location: BLE  Taken 03/11/2024 1600 by Danae Orleans, RN  Anti-Embolism Device Type: SCD, Knee  Anti-Embolism Device Status: On  Anti-Embolism Device Location: BLE  Taken 03/11/2024 1400 by Danae Orleans, RN  Anti-Embolism Device Type: SCD, Knee  Anti-Embolism Device Status: On  Anti-Embolism Device Location: BLE  Taken 03/11/2024 1200 by Danae Orleans, RN  Anti-Embolism Device Type: SCD, Knee  Anti-Embolism Device Status: On  Anti-Embolism Device Location: BLE  Taken 03/11/2024 1000 by Danae Orleans, RN  Anti-Embolism Device Type: SCD, Knee  Anti-Embolism Device Status: On  Anti-Embolism Device Location: BLE  Taken 03/11/2024 0800 by Danae Orleans, RN  Anti-Embolism Device Type: SCD, Knee  Anti-Embolism Device Status: Off  Anti-Embolism Device Location: BLE  Intervention: Prevent Infection  Recent Flowsheet Documentation  Taken 03/11/2024 1800 by Danae Orleans, RN  Infection Prevention:   environmental surveillance performed   hand hygiene promoted   personal protective equipment utilized   single patient room provided  Taken 03/11/2024 1600 by Danae Orleans, RN  Infection  Prevention:   environmental surveillance performed   equipment surfaces disinfected   hand hygiene promoted   single patient room provided   personal protective equipment utilized  Taken 03/11/2024 1400 by Danae Orleans, RN  Infection Prevention:   environmental surveillance performed   hand hygiene promoted   personal protective equipment utilized   single patient room provided  Taken 03/11/2024 1200 by Danae Orleans, RN  Infection Prevention:   environmental surveillance performed   equipment surfaces disinfected   hand hygiene promoted   personal protective equipment utilized   single patient room provided  Taken 03/11/2024 0800 by Danae Orleans, RN  Infection Prevention:   environmental surveillance performed   hand hygiene promoted   single patient room provided   personal protective equipment utilized  Goal: Optimal Comfort and Wellbeing  Outcome: Ongoing - Unchanged  Goal: Readiness for Transition of Care  Outcome: Ongoing - Unchanged  Goal: Rounds/Family Conference  Outcome: Ongoing - Unchanged     Problem: Infection  Goal: Absence of Infection Signs and Symptoms  Outcome: Ongoing - Unchanged  Intervention: Prevent or Manage Infection  Recent Flowsheet Documentation  Taken 03/11/2024 1800 by Danae Orleans, RN  Infection Management: aseptic technique maintained  Taken 03/11/2024 1600 by Danae Orleans, RN  Infection Management: aseptic technique maintained  Taken 03/11/2024 1400 by Danae Orleans, RN  Infection Management: aseptic technique maintained  Taken 03/11/2024 1200 by Danae Orleans, RN  Infection Management: aseptic technique maintained  Taken 03/11/2024 0800 by Danae Orleans, RN  Infection Management: aseptic technique maintained     Problem: Fall Injury Risk  Goal: Absence of Fall and Fall-Related Injury  Outcome: Ongoing - Unchanged  Intervention: Promote Injury-Free Environment  Recent Flowsheet Documentation  Taken 03/11/2024 1800 by Danae Orleans, RN  Safety Interventions: aspiration precautions  Taken 03/11/2024 1600 by Danae Orleans, RN  Safety Interventions: aspiration precautions  Taken 03/11/2024 1400 by Danae Orleans, RN  Safety Interventions: aspiration precautions  Taken 03/11/2024 1200 by Danae Orleans, RN  Safety Interventions: aspiration precautions  Taken 03/11/2024 1000 by Danae Orleans, RN  Safety Interventions:   aspiration precautions   bed alarm   fall reduction program maintained   family at bedside  Taken 03/11/2024 0800 by Danae Orleans, RN  Safety Interventions:   aspiration precautions   bed alarm   fall reduction program maintained   family at bedside   low bed     Problem: Wound  Goal: Optimal Coping  Outcome: Ongoing - Unchanged  Goal: Optimal Functional Ability  Outcome: Ongoing - Unchanged  Intervention: Optimize Functional Ability  Recent Flowsheet Documentation  Taken 03/11/2024 1400 by Danae Orleans, RN  Activity Management: bedrest  Taken 03/11/2024 1000 by Danae Orleans, RN  Activity Management: bedrest  Taken 03/11/2024 0800 by Danae Orleans, RN  Activity Management: bedrest  Goal: Absence of Infection Signs and Symptoms  Outcome: Ongoing - Unchanged  Intervention: Prevent or Manage Infection  Recent Flowsheet Documentation  Taken 03/11/2024 1800 by Danae Orleans, RN  Infection Management: aseptic technique maintained  Taken 03/11/2024 1600 by Danae Orleans, RN  Infection Management: aseptic technique maintained  Taken 03/11/2024 1400 by Danae Orleans, RN  Infection Management: aseptic technique maintained  Taken 03/11/2024 1200 by Danae Orleans, RN  Infection Management: aseptic technique maintained  Taken 03/11/2024 0800 by Danae Orleans, RN  Infection Management: aseptic technique maintained  Goal: Improved Oral Intake  Outcome: Ongoing - Unchanged  Goal: Optimal Pain Control and Function  Outcome: Ongoing - Unchanged  Intervention: Prevent or Manage Pain  Recent Flowsheet Documentation  Taken 03/11/2024 1400 by Danae Orleans, RN  Sleep/Rest Enhancement: awakenings minimized  Taken 03/11/2024 1000 by Danae Orleans, RN  Sleep/Rest Enhancement: awakenings minimized  Taken 03/11/2024 0800 by Danae Orleans, RN  Sleep/Rest Enhancement: awakenings minimized  Goal: Skin Health and Integrity  Outcome: Ongoing - Unchanged  Intervention: Optimize Skin Protection  Recent Flowsheet Documentation  Taken 03/11/2024 1800 by Danae Orleans, RN  Head of Bed [facility]) Positioning: HOB at 30-45 degrees  Taken 03/11/2024 1600 by Danae Orleans, RN  Pressure Reduction Techniques: weight shift assistance provided  Head of Bed [facility]) Positioning: HOB at 30-45 degrees  Taken 03/11/2024 1400 by Danae Orleans, RN  Activity Management: bedrest  Taken 03/11/2024 1200 by Danae Orleans, RN  Head of Bed [facility]) Positioning: HOB at 30-45 degrees  Taken 03/11/2024 1000 by Danae Orleans, RN  Activity Management: bedrest  Head of Bed [facility]) Positioning: HOB at 30-45 degrees  Taken 03/11/2024 0800 by Danae Orleans, RN  Activity Management: bedrest  Pressure Reduction Techniques:   weight shift assistance provided   heels elevated off bed   pressure points protected  Head of Bed (HOB) Positioning: HOB at 30-45 degrees  Pressure Reduction Devices:   pressure-redistributing mattress utilized   positioning supports utilized  Skin Protection:   adhesive use limited   incontinence pads utilized   tubing/devices free from skin contact  Goal: Optimal Wound Healing  Outcome: Ongoing - Unchanged  Intervention: Promote Wound Healing  Recent Flowsheet Documentation  Taken 03/11/2024 1400 by Danae Orleans, RN  Sleep/Rest Enhancement: awakenings minimized  Taken 03/11/2024 1000 by Danae Orleans, RN  Sleep/Rest Enhancement: awakenings minimized  Taken 03/11/2024 0800 by Danae Orleans, RN  Sleep/Rest Enhancement: awakenings minimized     Problem: Skin Injury Risk Increased  Goal: Skin Health and Integrity  Outcome: Ongoing - Unchanged  Intervention: Optimize Skin Protection  Recent Flowsheet Documentation  Taken 03/11/2024 1800 by Danae Orleans, RN  Head of Bed [facility]) Positioning: HOB at 30-45 degrees  Taken 03/11/2024 1600 by Danae Orleans, RN  Pressure Reduction Techniques: weight shift assistance provided  Head of Bed [facility]) Positioning: HOB at 30-45 degrees  Taken 03/11/2024 1400 by Danae Orleans, RN  Activity Management: bedrest  Taken 03/11/2024 1200 by Danae Orleans, RN  Head of Bed [facility]) Positioning: HOB at 30-45 degrees  Taken 03/11/2024 1000 by Danae Orleans, RN  Activity Management: bedrest  Head of Bed [facility]) Positioning: HOB at 30-45 degrees  Taken 03/11/2024 0800 by Danae Orleans, RN  Activity Management: bedrest  Pressure Reduction Techniques:   weight shift assistance provided   heels elevated off bed   pressure points protected  Head of Bed (HOB) Positioning: HOB at 30-45 degrees  Pressure Reduction Devices:   pressure-redistributing mattress utilized   positioning supports utilized  Skin Protection:   adhesive use limited   incontinence pads utilized   tubing/devices free from skin contact     Problem: Self-Care Deficit  Goal: Improved Ability to Complete Activities of Daily Living  Outcome: Ongoing - Unchanged     Problem: Artificial Airway  Goal: Effective Communication  Outcome: Ongoing - Unchanged  Goal: Optimal Device Function  Outcome: Ongoing - Unchanged  Intervention: Optimize Device Care and Function  Recent Flowsheet Documentation  Taken 03/11/2024 1800 by Danae Orleans, RN  Aspiration Precautions: respiratory status monitored  Taken 03/11/2024 1600 by Danae Orleans, RN  Aspiration Precautions: respiratory status monitored  Taken 03/11/2024 1400 by Danae Orleans, RN  Aspiration Precautions: respiratory status monitored  Taken 03/11/2024 1200  by Danae Orleans, RN  Aspiration Precautions: respiratory status monitored  Oral Care:   mouth swabbed   oral rinse provided   suction provided   tongue brushed  Taken 03/11/2024 0800 by Danae Orleans, RN  Aspiration Precautions: respiratory status monitored  Goal: Absence of Device-Related Skin or Tissue Injury  Outcome: Ongoing - Unchanged  Intervention: Maintain Skin and Tissue Health  Recent Flowsheet Documentation  Taken 03/11/2024 0800 by Danae Orleans, RN  Device Skin Pressure Protection:   absorbent pad utilized/changed   adhesive use limited   tubing/devices free from skin contact   positioning supports utilized     Problem: Malnutrition  Goal: Improved Nutritional Intake  Outcome: Ongoing - Unchanged     Problem: Mechanical Ventilation Invasive  Goal: Effective Communication  Outcome: Ongoing - Unchanged  Goal: Optimal Device Function  Outcome: Ongoing - Unchanged  Intervention: Optimize Device Care and Function  Recent Flowsheet Documentation  Taken 03/11/2024 1200 by Danae Orleans, RN  Oral Care:   mouth swabbed   oral rinse provided   suction provided   tongue brushed  Goal: Mechanical Ventilation Liberation  Outcome: Ongoing - Unchanged  Intervention: Promote Extubation and Mechanical Ventilation Liberation  Recent Flowsheet Documentation  Taken 03/11/2024 1400 by Danae Orleans, RN  Sleep/Rest Enhancement: awakenings minimized  Taken 03/11/2024 1000 by Danae Orleans, RN  Sleep/Rest Enhancement: awakenings minimized  Taken 03/11/2024 0800 by Danae Orleans, RN  Sleep/Rest Enhancement: awakenings minimized  Goal: Optimal Nutrition Delivery  Outcome: Ongoing - Unchanged  Goal: Absence of Device-Related Skin and Tissue Injury  Outcome: Ongoing - Unchanged  Intervention: Maintain Skin and Tissue Health  Recent Flowsheet Documentation  Taken 03/11/2024 0800 by Danae Orleans, RN  Device Skin Pressure Protection:   absorbent pad utilized/changed   adhesive use limited   tubing/devices free from skin contact   positioning supports utilized  Goal: Absence of Ventilator-Induced Lung Injury  Outcome: Ongoing - Unchanged  Intervention: Prevent Ventilator-Associated Pneumonia  Recent Flowsheet Documentation  Taken 03/11/2024 1800 by Danae Orleans, RN  Head of Bed [facility]) Positioning: HOB at 30-45 degrees  Taken 03/11/2024 1600 by Danae Orleans, RN  Head of Bed [facility]) Positioning: HOB at 30-45 degrees  Taken 03/11/2024 1200 by Danae Orleans, RN  Head of Bed [facility]) Positioning: HOB at 30-45 degrees  Oral Care:   mouth swabbed   oral rinse provided   suction provided   tongue brushed  Taken 03/11/2024 1000 by Danae Orleans, RN  Head of Bed [facility]) Positioning: HOB at 30-45 degrees  Taken 03/11/2024 0800 by Danae Orleans, RN  Head of Bed [facility]) Positioning: HOB at 30-45 degrees

## 2024-03-11 NOTE — Unmapped (Signed)
 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  - AKI from ATN 2/2 to volume depletion from vomiting + hypotension + CIN  - bCr 0.9-1  - CRRT from 3/7-3/10  - remains oliguric post-CRRT but currently without acute indications for HD  - recommendations:   - Bumex 4 mg + Diuril 500 mg x1    - continue supportive measures of MAPs >65, limiting further nephrotoxic medications & contrasted images (unless absolutely needed) & renally dosing medications for eGFR <15   - will continue to assess for RRT needs daily but expect she will need this in the coming days especially if she remains oliguric     # SBO s/p ex-lap   - Evaluation and management per primary team  - No changes to management from a nephrology standpoint at this time    RECOMMENDATIONS:   - diuretics as above  - no acute indication for RRT  - supportive measures  - We will continue to follow.     Moss Mc, DO  03/11/2024 3:43 PM     Medical decision-making for 03/11/24  Findings / Data     Patient has: []  acute illness w/systemic sxs  [mod]  []  two or more stable chronic illnesses [mod]  []  one chronic illness with acute exacerbation [mod]  []  acute complicated illness  [mod]  []  Undiagnosed new problem with uncertain prognosis  [mod] [x]  illness posing risk to life or bodily function (ex. 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 Probs At least 2:  Probs, Data, Risk   I reviewed: [x]  primary team note  []  consultant note(s)  []  external records [x]  chemistry results  [x]  CBC results  []  blood gas results  []  Other []  procedure/op note(s)   []  radiology report(s)  []  micro result(s)  []  w/ independent historian(s) Labs stable; oliguria >=3 Data Review (2 of 3)    I independently interpreted: []  Urine Sediment  []  Renal US []  CXR Images  []  CT Images  []  Other []  EKG Tracing N/A Any     I discussed: []  Pathology results w/ QHPs(s) from other specialties  []  Procedural findings w/ QHPs(s) from other specialties []  Imaging w/ QHP(s) from other specialties  [x]  Treatment plan w/ QHP(s) from other specialties Plan discussed with primary team Any     Mgm't requires: []  Prescription drug(s)  [mod]  []  Kidney biopsy  [mod]  []  Central line placement  [mod] [x]  High risk medication use and/or intensive toxicity monitoring [high]  [x]  Renal replacement therapy [high]  []  High risk kidney biopsy  [high]  []  Escalation of care  [high]  []  High risk central line placement  [high] RRT: High risk of complications from RRT requiring intensive monitoring and IV Diuresis: check BMP/Mg  Risk      _____________________________________________________________________________________    Subjective/Interval Events:   - intubated overnight due to respiratory distress; likely from mucus plugging  - made <500 mL of urine with Bumex 4 mg BID    Physical Exam:  Vitals:    03/11/24 1415 03/11/24 1430 03/11/24 1445 03/11/24 1523   BP:       Pulse: 54 53 58    Resp: 17 23 19  Temp:       TempSrc:       SpO2: 96% 98% 95%    Weight:       Height:    154.9 cm (5' 0.98)     I/O this shift:  In: 429 [I.V.:329; IV Piggyback:100]  Out: 250 [Urine:220; Drains:30]    Intake/Output Summary (Last 24 hours) at 03/11/2024 1543  Last data filed at 03/11/2024 1400  Gross per 24 hour   Intake 780.1 ml   Output 780 ml   Net 0.1 ml     Constitutional: ill-appearing; intubated & sedated  Heart: RRR, no m/r/g  Lungs: rhonci noted bilaterally  Ext: 2+ edema

## 2024-03-11 NOTE — Unmapped (Signed)
 Over night per ABG results patient was placed on Bipap. After CXR patient required intubation and bronch.Patient current settings PC 12/RR18/+10/ 100%. ABG to follow.

## 2024-03-11 NOTE — Unmapped (Shared)
 Division of Infectious Diseases  General Inpatient Consultation Service     ***PRELIMINARY NOTE ONLY - DRAFTED BEFORE PATIENT ENCOUNTER. Follow up final note for recommendations & contact info.***    For questions about this consult, page [phone] (Gen A Follow-up Pager).      Tammy Braun is being seen in consultation at the request of Aris Georgia, MD for evaluation and management of HAP.       PLAN FOR 03/11/2024    Diagnostic  Follow-up 3/12 BAL, 3/11 Bcx   Monitor for antimicrobial toxicity with the following:  {Monitoring Labs:88543::CBC w/diff at least once per week,clinical assessments for rashes or other skin changes}    Treatment  ***  Duration of therapy = ***  start date = USE ACTUAL CALENDAR DATE ***  end date = ***    I discussed the plans for today with {Contacts (Optional):83902::primary team} on 03/11/2024.    Our service will continue to follow.    I personally spent {UJWJ:19147} face-to-face and non-face-to-face in the care of this patient on 03/11/2024, which includes all pre, intra, and post visit time on the date of service.  All documented time was specific to the E/M visit and does not include any procedures that may have been performed.    Care for a suspected or confirmed infection was provided by an ID specialist in this encounter. [phone])      Paulina Fusi  [facility] Division of Infectious Diseases               MDM and Problem-Specific Assessments  ( .00ID2DAY  /  .21HYQMVHQION  /  .IDSS  / .Nancee Liter )     83 y.o. female with PMHx of bladder cancer with cystectomy and ileal conduit, COPD, Afib, moderate-severe TR, CAD, T2DM presented to [facility] with projectile vomiting and abdominal pain found to have SBO with course c/b HAP.    Patient has: []  acute illness w/systemic sxs  [mod] [x]  illness posing risk to life or function  [high]   I reviewed:   (3+) [x]  primary team note [x]  consultant note(s) [x]  procedure/op note(s) [x]  micro result(s)    [x]  CBC results [x]  chemistry results [x]  radiology report(s) [x]  w/indep. historian   I independently visualized:   (any)   []  cxs/plates in lab []  plain film images []  CT images []  PET images    []  path slide(s) []  ECG tracing []  MRI images []  nuclear scan   I discussed: (any) []  micro and/or path w/lab personnel []  drug options and/or interactions w/ID pharmD    []  procedure/OR findings w/other MD(s) []  echo and/or imaging w/other MD(s)    []  mgm't w/attending(s) involved in case []  setting up home abx w/OPAT team   Mgm't requires: []  prescription drug(s)  [mod] [x]  intensive toxicity monitoring  [high]       # HAP *** - {Status & Severity:93632}  - 3/4: Tracheal aspirate cx 2+ PsA (R-cefepime, ceftaz, pip+tazo; S-cipro, levo, mero, tobra)  - 3/7: CT Chest with new complete collapse of RM and RLL and LLL with extensive endobronchial secretions c/f superimposed aspiration pneumonia  - 3/8: Bronchoscopy with copious thick white and yellow secretions. L/R BAL growing PsA, C.glabrata  - 3/12: Repeat bronchoscopy with copious thick white secretions. L/R BAL pending, GNR on gs  Describe what you want to do about the problem here. ***      # SBO ***  - {Status & Severity:93632}  - 2/26: CTAP with small bowel  obstruction  - 2/28: ex-lap, reduction of parastomal hernia, small bowel resection with primary anastomosis, lysis of adhesion, abdomen left open with ABThera in place  - 3/4: OR for abdominal closure  Describe what you want to do about the problem here. ***      # Additional ID-related problem goes here ***  - {Status & Severity:93632}  Description of problem here. ***  Describe what you want to do about the problem here. ***      # Management of prescription antimicrobials needing intensive toxicity monitoring - acute, poses threat to life or bodily function  [high]  {Adverse Effects:89171}  See recommendations in blue box above.      # Disposition  SUCCINCTLY explain what you anticipate dispo might be (e.g., OPAT candidacy). ***  ***             Antimicrobials & Other Medications  ( .00IDGANTT  /  .00IDGANTTLIST  )     Current  Meropenem 3/7-    Previous  Micafungin 3/11  Pip+tazo 3/1-3/6    Immunomodulators and antipyretics  None       Current Medications as of 03/11/2024  Scheduled  PRN   [Provider Hold] acetaminophen, 650 mg, Q6H  [START ON 03/12/2024] amiodarone, 150 mg, daily  [Provider Hold] aspirin, 81 mg, Daily  [Provider Hold] atorvastatin, 40 mg, Daily  budesonide (PULMICORT) nebulizer solution, 0.5 mg, BID (RT)  flu vacc ts2024-25(33yr up)-PF, 0.5 mL, During hospitalization  [START ON 03/14/2024] fluPHENAZine decanoate, 50 mg, Q30 Days  insulin regular, 0-20 Units, Q6H SCH  iohexol, 50 mL, Once  ipratropium, 500 mcg, Q6H (RT)  meropenem, 500 mg, Q12H  pantoprazole (Protonix) intravenous solution, 40 mg, Daily  [Provider Hold] polyethylene glycol, 17 g, Daily  [Provider Hold] sennosides, 2.5 mL, Nightly  sodium chloride, 10 mL, Q8H  sodium chloride, 10 mL, Q8H  sodium chloride, 10 mL, Q8H      albuterol, 2.5 mg, Q6H PRN  [Provider Hold] cyclobenzaprine, 5 mg, BID PRN  dextrose in water, 12.5 g, Q10 Min PRN  fentaNYL (PF), 25 mcg, Q30 Min PRN   Or  fentaNYL (PF), 50 mcg, Q30 Min PRN  heparin (porcine), 1,200 Units, Q1H PRN  heparin (porcine), 2,000 Units, Q6H PRN  nicotine polacrilex, 4 mg, Q1H PRN   Or  nicotine polacrilex, 4 mg, Q1H PRN  ondansetron, 4 mg, Q6H PRN  oxyCODONE, 2.5 mg, Q4H PRN   Or  oxyCODONE, 5 mg, Q4H PRN           Physical Exam     Temp:  [36.5 ??C (97.7 ??F)-37.7 ??C (99.9 ??F)] 37.2 ??C (99 ??F)  Heart Rate:  [52-71] 54  SpO2 Pulse:  [51-72] 54  Resp:  [15-34] 25  BP: (90-145)/(38-106) 130/53  MAP (mmHg):  [54-114] 77  A BP-1: (49-172)/(36-98) 158/56  MAP:  [47 mmHg-101 mmHg] 90 mmHg  FiO2 (%):  [35 %-100 %] 40 %  SpO2:  [75 %-100 %] 98 %    Actual body weight: 68 kg (149 lb 14.6 oz)  Ideal body weight: 47.8 kg (105 lb 4.8 oz)  Adjusted ideal body weight: 55.9 kg (123 lb 2.3 oz)      Const [x]  vital signs above []  WDWN, NAD, non-toxic appearance  []        Eyes   []  Lids normal bilaterally, conjunctiva anicteric and noninjected OU  []  PERRL   []        ENMT     []  Normal appearance of external nose and  ears       []  OP clear    []  MMM, no lesions on lips or gums, dentition good        []  Hearing normal   []        Neck    []  Neck of normal appearance and trachea midline        []  No thyromegaly, nodules, or tenderness   []        Lymph    []  No LAD in neck       []  No LAD in supraclavicular area       []  No LAD in axillae   []  No LAD in epitrochlear chains       []  No LAD in inguinal areas  []        CV    []  RRR, no m/r/g, S1/S2       []  No peripheral edema, WWP       []  Pedal pulses intact   []        Resp   []  Normal WOB       []  CTAB   []        GI   []  Normal inspection, NTND, NABS       []  No umbilical hernia on exam       []  No hepatosplenomegaly       []  Inspection of perineal and perianal areas normal  []        GU   []  Normal external genitalia       []        MSK   []  No clubbing or cyanosis of hands       []  No focal tenderness or abnormalities on palpation of joints in RUE, LUE, RLE, or LLE  []        Skin   []  No rashes, lesions, or ulcers of visualized skin       []  Skin warm and dry to palpation   []        Neuro   []  CNs II-XII grossly intact       []  Sensation to light touch grossly intact throughout   []  DTRs normal and symmetric throughout   []  Unable to assess due to critical illness, sedation, or mental status  []        Psych   []  Appropriate affect      []  Oriented to person, place, time      []  Judgment and insight are appropriate   []  Unable to assess due to critical illness, sedation, or mental status  []           Patient Lines/Drains/Airways Status       Active Active Lines, Drains, & Airways       Name Placement date Placement time Site Days    Non-Surgical Airway Nasal Cannula 06/19/22  1035  --  631    ETT  7.5 03/11/24  0424  -- less than 1    CVC Triple Lumen 03/07/24 Non-tunneled Left Internal jugular 03/07/24  0849  Internal jugular  4    Hemodialysis Catheter With Distal Infusion Port 03/06/24 Right Internal jugular 1.2 mL 1.2 mL 03/06/24  2301  Internal jugular  4    Closed/Suction Drain 1 Left Shoulder Accordion 10 Fr. 04/08/18  1430  Shoulder  2164    Closed/Suction Drain 1 LUQ 15 Fr. 03/03/24  0957  LUQ  8    Closed/Suction Drain 2 Lateral LLQ 19 Fr. 03/03/24  1015  LLQ  8  NG/OG Tube 14 Fr. Left nostril 03/03/24  1440  Left nostril  7    Urostomy Ileal conduit RLQ 04/08/02  --  RLQ  8008    Urostomy 04/08/22  --  --  703    Peripheral IV 03/04/24 Anterior;Proximal;Right Forearm 03/04/24  0021  Forearm  7    Arterial Line 03/02/24 Right Radial 03/02/24  2300  Radial  8                      Data for ID Decision Making  ( IDGENCONMDM )       Micro & Serological Data   ( RSLTMICRO  /  [id number]  /  Arley Phenix  /  00CXRES  /  00CXSUSC )    Microbiology Results (last day)       Procedure Component Value Date/Time Date/Time    Quantitative Bronchial Culture [[id number]]  (Abnormal)  (Susceptibility) Collected: 03/07/24 1705    Lab Status: Preliminary result Specimen: Lavage, Bronchial from Lung, Left Updated: 03/11/24 1134     Quantitative Bronchial Culture 30,000 CFU/mL Pseudomonas aeruginosa      100,000 CFU/mL Candida glabrata     Comment: Oropharyngeal flora component        Gram Stain Result 1+ Polymorphonuclear leukocytes      1+ Yeast    Narrative:      Specimen Source: Lung, Left    Susceptibility       Pseudomonas aeruginosa (1)       Antibiotic Interpretation Method Status    Cefepime Resistant KIRBY BAUER Preliminary    Ceftazidime Resistant KIRBY BAUER Preliminary    Ciprofloxacin Susceptible KIRBY BAUER Preliminary    Levofloxacin Susceptible KIRBY BAUER Preliminary    Meropenem Susceptible KIRBY BAUER Preliminary    Piperacillin + Tazobactam Resistant KIRBY BAUER Preliminary    Tobramycin Susceptible KIRBY BAUER Preliminary                       Quantitative Bronchial Culture [[id number]] (Abnormal)  (Susceptibility) Collected: 03/07/24 1705    Lab Status: Preliminary result Specimen: Lavage, Bronchial from Lung, Right Updated: 03/11/24 1132     Quantitative Bronchial Culture 20,000 CFU/mL Pseudomonas aeruginosa      200,000 CFU/mL Candida glabrata     Comment: Oropharyngeal flora component        Gram Stain Result 2+ Polymorphonuclear leukocytes      2+ Yeast    Narrative:      Specimen Source: Lung, Right    Susceptibility       Pseudomonas aeruginosa (1)       Antibiotic Interpretation Method Status    Cefepime Resistant KIRBY BAUER Preliminary    Ceftazidime Resistant KIRBY BAUER Preliminary    Ciprofloxacin Susceptible KIRBY BAUER Preliminary    Levofloxacin Susceptible KIRBY BAUER Preliminary    Meropenem Resistant KIRBY BAUER Preliminary    Piperacillin + Tazobactam Resistant KIRBY BAUER Preliminary    Tobramycin Susceptible KIRBY BAUER Preliminary                       Quantitative Bronchial Culture [[id number]] Collected: 03/11/24 0524    Lab Status: Preliminary result Specimen: Lavage, Bronchial from Lung, Right Updated: 03/11/24 0700     Gram Stain Result 2+ Polymorphonuclear leukocytes      Probable 1+ Gram negative rods (bacilli)    Narrative:      Specimen Source: Lung, Right    Quantitative Bronchial Culture [[id number]] Collected: 03/11/24 0524  Lab Status: Preliminary result Specimen: Lavage, Bronchial from Lung, Left Updated: 03/11/24 0650     Gram Stain Result 4+ Polymorphonuclear leukocytes      1+ Gram negative rods (bacilli)    Narrative:      Specimen Source: Lung, Left    Blood Culture #1 [[id number]]  (Normal) Collected: 03/10/24 0517    Lab Status: Preliminary result Specimen: Blood from 1 Peripheral Draw Updated: 03/11/24 0545     Blood Culture, Routine No Growth at 24 hours    Blood Culture #2 [[id number]]  (Normal) Collected: 03/10/24 0522    Lab Status: Preliminary result Specimen: Blood from 1 Peripheral Draw Updated: 03/11/24 0545     Blood Culture, Routine No Growth at 24 hours          3/4: Tracheal aspirate cx 2+ PsA (R-cefepime, ceftaz, pip+tazo; S-cipro, levo, mero, tobra)  3/8 L/R BAL growing PsA, C.glabrata  3/12 L/R BAL pending, GNR on gs    Recent Studies  ( RISRSLT )    CT Chest wo Contrast  Result Date: 03/06/24  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      New complete collapse of the right middle and lower lobes and left lower lobe with extensive endobronchial secretions concerning for superimposed aspiration pneumonia.      Diffuse bronchial wall thickening suggestive of infectious or inflammatory bronchitis.      Small right-sided and trace left-sided pleural effusions.        Subjective:    Initial Consult Documentation from March 11, 2024     Sources of information include: {Sources for HPI:73728::chart review}.    HPI    83 y.o. female with PMHx of bladder cancer with cystectomy and ileal conduit, COPD, Afib, moderate-severe TR, CAD, T2DM presented to [facility] with projectile vomiting and abdominal pain found to have SBO with course c/b HAP.    Patient presented to [facility] ED on 2/26 with abdominal pain and emesis. She initially presented to UC and was recommended to present to ED due to severity of emesis. Upon arrival to ED, patient was afebrile with WBC to 11.4. CTAP (2/26) revealed small bowel obstruction. Urine culture (2/26) grew mixed GP/GNOs. She was started on ceftriaxone from 2/27-2/28. She was admitted to [facility] for worsening hypoxia on HFNC that ultimately required intubation on 2/28. On 2/28, patient underwent ex-lap, reduction of parastomal hernia, small bowel resection with primary anastomosis, lysis of adhesion, abdomen left open with ABThera in place. She was started on pip+tazo (3/1-3/6). On 3/2, patient returned to OR for attempt at abdominal closure. Unfortunately, her abdomen was left open due to desaturations during closure attempt. She ultimately returned to OR for abdominal closure on 3/4. Tracheal aspirate culture (3/4) grew 2+ PsA (R-cefepime, ceftaz, pip+tazo; S-cipro, levo, mero, tobra).  CT chest (3/7) revealed new complete collapse of right middle and lower lobes and LLL with extensive endobronchial secretions concerning for superimposed aspiration pneumonia. She was treated with meropenem (3/7-). She was noted with acute kidney injury and was started on CRRT from 3/7-3/11. Repeat bronchoscopy on 3/8 with copious thick white and yellow secretions. Right and left BAL growing PsA and C.glabrata. On 3/10, she was extubated to HFNC but was reintubated on 3/12 for respiratory failure. Repeat bronchoscopy on 3/12 noted copious thick white secretions, edematous mucosa, and collapse with mucus plugging. Cultures pending with GNR on gram stain.    Past Medical History   Patient  has a past medical history of Anxiety, Arthritis, At risk for falls,  Breast cyst, Cancer (CMS-HCC), 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 (07/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      Meds and Allergies  Patient has a current medication list which includes the following prescription(s): albuterol, amlodipine, atorvastatin, glucose blood, blood-glucose meter, candesartan, cyclobenzaprine, diltiazem, fluphenazine decanoate, furosemide, gabapentin, inhalational spacing device, lancets, MEDICAL SUPPLY ITEM, MEDICAL SUPPLY ITEM, tramadol, and trolamine salicylate, and the following Facility-Administered Medications: [Provider Hold] acetaminophen, albuterol, [START ON 03/12/2024] amiodarone in dextrose,iso-osm, [Provider Hold] aspirin, [Provider Hold] atorvastatin, budesonide, [Provider Hold] cyclobenzaprine, dextrose, epinephrine, fentanyl (pf) **OR** fentanyl (pf), flu vacc ts2024-25(67yr up)-pf, [START ON 03/14/2024] fluphenazine decanoate, heparin (porcine), heparin (porcine), heparin (porcine), insulin regular, iohexol, ipratropium, meropenem (MERREM) 500 mg in sodium chloride 0.9 % (NS) 100 mL IVPB-MBP, nicotine polacrilex **OR** nicotine polacrilex, norepinephrine bitartrate-d5w, nxstage rfp 400 (+/- bb) 5000 ml - contains 2 meq/l of potassium, nxstage/multibic rfp 401 (+/- bb) 5000 ml - contains 4 meq/l of potassium, ondansetron, oxycodone **OR** oxycodone, pantoprazole, [Provider Hold] polyethylene glycol, propofol 10 mg/ml infusion, propofol, [Provider Hold] sennosides, sodium chloride, sodium chloride, sodium chloride, succinylcholine, vasopressin.    Allergies: Lisinopril, Losartan, and Hctz [hydrochlorothiazide]    Social History  Patient  reports that she has been smoking cigarettes. She started smoking about 65 years ago. She has a 65.9 pack-year smoking history. She has never used smokeless tobacco. She reports that she does not drink alcohol and does not use drugs.    Scribe's Attest:  Francena Hanly, MD obtained and performed the history, physical exam and medical decision making elements that were entered into the chart. Documentation assistance was provided by me personally. Signed by Paulina Fusi, Scribe, on March 11, 2024 at 2:43 PM.     Provider???s Attestation:   Documentation assistance provided by the Scribe, Paulina Fusi. I was present during the time the encounter was Francena Hanly, MD. March 11, 2024 at 2:43 PM

## 2024-03-11 NOTE — Unmapped (Signed)
 Problem: Adult Inpatient Plan of Care  Goal: Absence of Hospital-Acquired Illness or Injury  Intervention: Identify and Manage Fall Risk  Recent Flowsheet Documentation  Taken 03/11/2024 0200 by Clydene Pugh, RN  Safety Interventions:   aspiration precautions   bed alarm   bleeding precautions   infection management   enteral feeding safety   fall reduction program maintained  Taken 03/11/2024 0000 by Clydene Pugh, RN  Safety Interventions:   aspiration precautions   bed alarm   bleeding precautions   infection management   enteral feeding safety   fall reduction program maintained  Intervention: Prevent Skin Injury  Recent Flowsheet Documentation  Taken 03/11/2024 0200 by Clydene Pugh, RN  Positioning for Skin: Right  Device Skin Pressure Protection:   absorbent pad utilized/changed   adhesive use limited   skin-to-skin areas padded   skin-to-device areas padded   pressure points protected  Skin Protection:   adhesive use limited   skin-to-device areas padded   skin-to-skin areas padded   tubing/devices free from skin contact   protective footwear used   incontinence pads utilized  Taken 03/11/2024 0000 by Clydene Pugh, RN  Positioning for Skin: Right  Device Skin Pressure Protection:   absorbent pad utilized/changed   pressure points protected   skin-to-device areas padded   positioning supports utilized   skin-to-skin areas padded   tubing/devices free from skin contact  Skin Protection:   skin sealant/moisture barrier applied   silicone foam dressing in place   skin-to-device areas padded   skin-to-skin areas padded   tubing/devices free from skin contact  Taken 03/10/2024 2000 by Clydene Pugh, RN  Positioning for Skin: Right  Device Skin Pressure Protection:   absorbent pad utilized/changed   pressure points protected   skin-to-device areas padded   positioning supports utilized   skin-to-skin areas padded   tubing/devices free from skin contact  Skin Protection:   skin sealant/moisture barrier applied   silicone foam dressing in place   skin-to-device areas padded   skin-to-skin areas padded   tubing/devices free from skin contact  Intervention: Prevent and Manage VTE (Venous Thromboembolism) Risk  Recent Flowsheet Documentation  Taken 03/11/2024 0200 by Clydene Pugh, RN  Anti-Embolism Device Type: SCD, Knee  Anti-Embolism Device Status: Off  Anti-Embolism Device Location: BLE  Taken 03/11/2024 0000 by Clydene Pugh, RN  Anti-Embolism Device Type: SCD, Knee  Anti-Embolism Device Status: On  Anti-Embolism Device Location: BLE  Taken 03/10/2024 2000 by Clydene Pugh, RN  Anti-Embolism Device Type: SCD, Knee  Anti-Embolism Device Status: On  Anti-Embolism Device Location: BLE  Intervention: Prevent Infection  Recent Flowsheet Documentation  Taken 03/11/2024 0200 by Clydene Pugh, RN  Infection Prevention:   environmental surveillance performed   personal protective equipment utilized  Taken 03/11/2024 0000 by Clydene Pugh, RN  Infection Prevention:   environmental surveillance performed   personal protective equipment utilized     Problem: Adult Inpatient Plan of Care  Goal: Absence of Hospital-Acquired Illness or Injury  Intervention: Prevent Skin Injury  Recent Flowsheet Documentation  Taken 03/11/2024 0200 by Clydene Pugh, RN  Positioning for Skin: Right  Device Skin Pressure Protection:   absorbent pad utilized/changed   adhesive use limited   skin-to-skin areas padded   skin-to-device areas padded   pressure points protected  Skin Protection:   adhesive use limited   skin-to-device areas padded   skin-to-skin areas  padded   tubing/devices free from skin contact   protective footwear used   incontinence pads utilized  Taken 03/11/2024 0000 by Clydene Pugh, RN  Positioning for Skin: Right  Device Skin Pressure Protection:   absorbent pad utilized/changed   pressure points protected   skin-to-device areas padded   positioning supports utilized   skin-to-skin areas padded   tubing/devices free from skin contact  Skin Protection:   skin sealant/moisture barrier applied   silicone foam dressing in place   skin-to-device areas padded   skin-to-skin areas padded   tubing/devices free from skin contact  Taken 03/10/2024 2000 by Clydene Pugh, RN  Positioning for Skin: Right  Device Skin Pressure Protection:   absorbent pad utilized/changed   pressure points protected   skin-to-device areas padded   positioning supports utilized   skin-to-skin areas padded   tubing/devices free from skin contact  Skin Protection:   skin sealant/moisture barrier applied   silicone foam dressing in place   skin-to-device areas padded   skin-to-skin areas padded   tubing/devices free from skin contact     Problem: Adult Inpatient Plan of Care  Goal: Absence of Hospital-Acquired Illness or Injury  Intervention: Prevent and Manage VTE (Venous Thromboembolism) Risk  Recent Flowsheet Documentation  Taken 03/11/2024 0200 by Clydene Pugh, RN  Anti-Embolism Device Type: SCD, Knee  Anti-Embolism Device Status: Off  Anti-Embolism Device Location: BLE  Taken 03/11/2024 0000 by Clydene Pugh, RN  Anti-Embolism Device Type: SCD, Knee  Anti-Embolism Device Status: On  Anti-Embolism Device Location: BLE  Taken 03/10/2024 2000 by Clydene Pugh, RN  Anti-Embolism Device Type: SCD, Knee  Anti-Embolism Device Status: On  Anti-Embolism Device Location: BLE     Problem: Adult Inpatient Plan of Care  Goal: Absence of Hospital-Acquired Illness or Injury  Intervention: Prevent Infection  Recent Flowsheet Documentation  Taken 03/11/2024 0200 by Clydene Pugh, RN  Infection Prevention:   environmental surveillance performed   personal protective equipment utilized  Taken 03/11/2024 0000 by Clydene Pugh, RN  Infection Prevention:   environmental surveillance performed   personal protective equipment utilized     Problem: Fall Injury Risk  Goal: Absence of Fall and Fall-Related Injury  Intervention: Promote Injury-Free Environment  Recent Flowsheet Documentation  Taken 03/11/2024 0200 by Clydene Pugh, RN  Safety Interventions:   aspiration precautions   bed alarm   bleeding precautions   infection management   enteral feeding safety   fall reduction program maintained  Taken 03/11/2024 0000 by Clydene Pugh, RN  Safety Interventions:   aspiration precautions   bed alarm   bleeding precautions   infection management   enteral feeding safety   fall reduction program maintained     Problem: Wound  Goal: Optimal Functional Ability  Intervention: Optimize Functional Ability  Recent Flowsheet Documentation  Taken 03/11/2024 0200 by Clydene Pugh, RN  Activity Management: bedrest  Taken 03/11/2024 0000 by Clydene Pugh, RN  Activity Management: bedrest  Goal: Optimal Pain Control and Function  Intervention: Prevent or Manage Pain  Recent Flowsheet Documentation  Taken 03/11/2024 0200 by Clydene Pugh, RN  Sleep/Rest Enhancement:   awakenings minimized   noise level reduced   regular sleep/rest pattern promoted   relaxation techniques promoted   room darkened  Taken 03/11/2024 0000 by Clydene Pugh, RN  Sleep/Rest Enhancement:   awakenings minimized   noise level reduced  regular sleep/rest pattern promoted   relaxation techniques promoted   room darkened  Goal: Skin Health and Integrity  Intervention: Optimize Skin Protection  Recent Flowsheet Documentation  Taken 03/11/2024 0200 by Clydene Pugh, RN  Activity Management: bedrest  Pressure Reduction Techniques:   heels elevated off bed   positioned off wounds   pressure points protected  Head of Bed (HOB) Positioning: HOB elevated  Pressure Reduction Devices:   elbow protectors utilized   heel offloading device utilized   pressure-redistributing mattress utilized  Skin Protection:   adhesive use limited   skin-to-device areas padded   skin-to-skin areas padded   tubing/devices free from skin contact   protective footwear used   incontinence pads utilized  Taken 03/11/2024 0000 by Clydene Pugh, RN  Activity Management: bedrest  Pressure Reduction Techniques:   frequent weight shift encouraged   pressure points protected   weight shift assistance provided  Head of Bed (HOB) Positioning: HOB at 30-45 degrees  Pressure Reduction Devices:   heel offloading device utilized   positioning supports utilized   pressure-redistributing mattress utilized   specialty bed utilized   foam padding utilized  Skin Protection:   skin sealant/moisture barrier applied   silicone foam dressing in place   skin-to-device areas padded   skin-to-skin areas padded   tubing/devices free from skin contact  Taken 03/10/2024 2000 by Clydene Pugh, RN  Pressure Reduction Techniques:   frequent weight shift encouraged   pressure points protected   weight shift assistance provided  Head of Bed (HOB) Positioning: HOB at 30-45 degrees  Pressure Reduction Devices:   heel offloading device utilized   positioning supports utilized   pressure-redistributing mattress utilized   specialty bed utilized   foam padding utilized  Skin Protection:   skin sealant/moisture barrier applied   silicone foam dressing in place   skin-to-device areas padded   skin-to-skin areas padded   tubing/devices free from skin contact  Goal: Optimal Wound Healing  Intervention: Promote Wound Healing  Recent Flowsheet Documentation  Taken 03/11/2024 0200 by Clydene Pugh, RN  Sleep/Rest Enhancement:   awakenings minimized   noise level reduced   regular sleep/rest pattern promoted   relaxation techniques promoted   room darkened  Taken 03/11/2024 0000 by Clydene Pugh, RN  Sleep/Rest Enhancement:   awakenings minimized   noise level reduced   regular sleep/rest pattern promoted relaxation techniques promoted   room darkened     Problem: Wound  Goal: Optimal Pain Control and Function  Intervention: Prevent or Manage Pain  Recent Flowsheet Documentation  Taken 03/11/2024 0200 by Clydene Pugh, RN  Sleep/Rest Enhancement:   awakenings minimized   noise level reduced   regular sleep/rest pattern promoted   relaxation techniques promoted   room darkened  Taken 03/11/2024 0000 by Clydene Pugh, RN  Sleep/Rest Enhancement:   awakenings minimized   noise level reduced   regular sleep/rest pattern promoted   relaxation techniques promoted   room darkened     Problem: Wound  Goal: Skin Health and Integrity  Intervention: Optimize Skin Protection  Recent Flowsheet Documentation  Taken 03/11/2024 0200 by Clydene Pugh, RN  Activity Management: bedrest  Pressure Reduction Techniques:   heels elevated off bed   positioned off wounds   pressure points protected  Head of Bed (HOB) Positioning: HOB elevated  Pressure Reduction Devices:   elbow protectors utilized   heel offloading device utilized  pressure-redistributing mattress utilized  Skin Protection:   adhesive use limited   skin-to-device areas padded   skin-to-skin areas padded   tubing/devices free from skin contact   protective footwear used   incontinence pads utilized  Taken 03/11/2024 0000 by Clydene Pugh, RN  Activity Management: bedrest  Pressure Reduction Techniques:   frequent weight shift encouraged   pressure points protected   weight shift assistance provided  Head of Bed (HOB) Positioning: HOB at 30-45 degrees  Pressure Reduction Devices:   heel offloading device utilized   positioning supports utilized   pressure-redistributing mattress utilized   specialty bed utilized   foam padding utilized  Skin Protection:   skin sealant/moisture barrier applied   silicone foam dressing in place   skin-to-device areas padded   skin-to-skin areas padded   tubing/devices free from skin contact  Taken 03/10/2024 2000 by Clydene Pugh, RN  Pressure Reduction Techniques:   frequent weight shift encouraged   pressure points protected   weight shift assistance provided  Head of Bed (HOB) Positioning: HOB at 30-45 degrees  Pressure Reduction Devices:   heel offloading device utilized   positioning supports utilized   pressure-redistributing mattress utilized   specialty bed utilized   foam padding utilized  Skin Protection:   skin sealant/moisture barrier applied   silicone foam dressing in place   skin-to-device areas padded   skin-to-skin areas padded   tubing/devices free from skin contact     Problem: Wound  Goal: Optimal Wound Healing  Intervention: Promote Wound Healing  Recent Flowsheet Documentation  Taken 03/11/2024 0200 by Clydene Pugh, RN  Sleep/Rest Enhancement:   awakenings minimized   noise level reduced   regular sleep/rest pattern promoted   relaxation techniques promoted   room darkened  Taken 03/11/2024 0000 by Clydene Pugh, RN  Sleep/Rest Enhancement:   awakenings minimized   noise level reduced   regular sleep/rest pattern promoted   relaxation techniques promoted   room darkened     Problem: Skin Injury Risk Increased  Goal: Skin Health and Integrity  Intervention: Optimize Skin Protection  Recent Flowsheet Documentation  Taken 03/11/2024 0200 by Clydene Pugh, RN  Activity Management: bedrest  Pressure Reduction Techniques:   heels elevated off bed   positioned off wounds   pressure points protected  Head of Bed (HOB) Positioning: HOB elevated  Pressure Reduction Devices:   elbow protectors utilized   heel offloading device utilized   pressure-redistributing mattress utilized  Skin Protection:   adhesive use limited   skin-to-device areas padded   skin-to-skin areas padded   tubing/devices free from skin contact   protective footwear used   incontinence pads utilized  Taken 03/11/2024 0000 by Clydene Pugh, RN  Activity Management: bedrest  Pressure Reduction Techniques:   frequent weight shift encouraged   pressure points protected   weight shift assistance provided  Head of Bed (HOB) Positioning: HOB at 30-45 degrees  Pressure Reduction Devices:   heel offloading device utilized   positioning supports utilized   pressure-redistributing mattress utilized   specialty bed utilized   foam padding utilized  Skin Protection:   skin sealant/moisture barrier applied   silicone foam dressing in place   skin-to-device areas padded   skin-to-skin areas padded   tubing/devices free from skin contact  Taken 03/10/2024 2000 by Clydene Pugh, RN  Pressure Reduction Techniques:   frequent weight shift encouraged   pressure points protected  weight shift assistance provided  Head of Bed (HOB) Positioning: HOB at 30-45 degrees  Pressure Reduction Devices:   heel offloading device utilized   positioning supports utilized   pressure-redistributing mattress utilized   specialty bed utilized   foam padding utilized  Skin Protection:   skin sealant/moisture barrier applied   silicone foam dressing in place   skin-to-device areas padded   skin-to-skin areas padded   tubing/devices free from skin contact     Problem: Mechanical Ventilation Invasive  Goal: Optimal Device Function  Intervention: Optimize Device Care and Function  Recent Flowsheet Documentation  Taken 03/11/2024 0200 by Clydene Pugh, RN  Oral Care: lip/mouth moisturizer applied  Taken 03/11/2024 0000 by Clydene Pugh, RN  Oral Care: lip/mouth moisturizer applied  Taken 03/10/2024 2300 by Clydene Pugh, RN  Oral Care: mouth swabbed  Taken 03/10/2024 2000 by Clydene Pugh, RN  Oral Care:   mouth swabbed   suction provided  Goal: Mechanical Ventilation Liberation  Intervention: Promote Extubation and Mechanical Ventilation Liberation  Recent Flowsheet Documentation  Taken 03/11/2024 0200 by Clydene Pugh, RN  Sleep/Rest Enhancement: awakenings minimized   noise level reduced   regular sleep/rest pattern promoted   relaxation techniques promoted   room darkened  Taken 03/11/2024 0000 by Clydene Pugh, RN  Sleep/Rest Enhancement:   awakenings minimized   noise level reduced   regular sleep/rest pattern promoted   relaxation techniques promoted   room darkened  Goal: Absence of Device-Related Skin and Tissue Injury  Intervention: Maintain Skin and Tissue Health  Recent Flowsheet Documentation  Taken 03/11/2024 0200 by Clydene Pugh, RN  Device Skin Pressure Protection:   absorbent pad utilized/changed   adhesive use limited   skin-to-skin areas padded   skin-to-device areas padded   pressure points protected  Taken 03/11/2024 0000 by Clydene Pugh, RN  Device Skin Pressure Protection:   absorbent pad utilized/changed   pressure points protected   skin-to-device areas padded   positioning supports utilized   skin-to-skin areas padded   tubing/devices free from skin contact  Taken 03/10/2024 2000 by Clydene Pugh, RN  Device Skin Pressure Protection:   absorbent pad utilized/changed   pressure points protected   skin-to-device areas padded   positioning supports utilized   skin-to-skin areas padded   tubing/devices free from skin contact  Goal: Absence of Ventilator-Induced Lung Injury  Intervention: Prevent Ventilator-Associated Pneumonia  Recent Flowsheet Documentation  Taken 03/11/2024 0200 by Clydene Pugh, RN  Head of Bed [facility]) Positioning: [facility] elevated  Oral Care: lip/mouth moisturizer applied  Taken 03/11/2024 0000 by Clydene Pugh, RN  Head of Bed [facility]) Positioning: HOB at 30-45 degrees  Oral Care: lip/mouth moisturizer applied  Taken 03/10/2024 2300 by Clydene Pugh, RN  Oral Care: mouth swabbed  Taken 03/10/2024 2000 by Clydene Pugh, RN  Head of Bed [facility]) Positioning: HOB at 30-45 degrees  Oral Care:   mouth swabbed   suction provided     Problem: Mechanical Ventilation Invasive  Goal: Absence of Device-Related Skin and Tissue Injury  Intervention: Maintain Skin and Tissue Health  Recent Flowsheet Documentation  Taken 03/11/2024 0200 by Clydene Pugh, RN  Device Skin Pressure Protection:   absorbent pad utilized/changed   adhesive use limited   skin-to-skin areas padded   skin-to-device areas padded   pressure points protected  Taken 03/11/2024 0000 by Lynder Parents  O, RN  Device Skin Pressure Protection:   absorbent pad utilized/changed   pressure points protected   skin-to-device areas padded   positioning supports utilized   skin-to-skin areas padded   tubing/devices free from skin contact  Taken 03/10/2024 2000 by Clydene Pugh, RN  Device Skin Pressure Protection:   absorbent pad utilized/changed   pressure points protected   skin-to-device areas padded   positioning supports utilized   skin-to-skin areas padded   tubing/devices free from skin contact

## 2024-03-11 NOTE — Unmapped (Signed)
 Problem: Artificial Airway  Goal: Effective Communication  Outcome: Progressing  Goal: Optimal Device Function  Outcome: Progressing  Intervention: Optimize Device Care and Function  Recent Flowsheet Documentation  Taken 03/11/2024 1523 by Rogue Bussing, RRT  Oral Care: suction provided  Taken 03/11/2024 5784 by Rogue Bussing, RRT  Oral Care:   mouth swabbed   oral rinse provided   suction provided   teeth brushed   tongue brushed  Goal: Absence of Device-Related Skin or Tissue Injury  Outcome: Progressing     Problem: Mechanical Ventilation Invasive  Goal: Effective Communication  Outcome: Progressing  Goal: Optimal Device Function  Outcome: Progressing  Intervention: Optimize Device Care and Function  Recent Flowsheet Documentation  Taken 03/11/2024 1523 by Rogue Bussing, RRT  Oral Care: suction provided  Taken 03/11/2024 6962 by Rogue Bussing, RRT  Oral Care:   mouth swabbed   oral rinse provided   suction provided   teeth brushed   tongue brushed  Goal: Mechanical Ventilation Liberation  Outcome: Progressing  Goal: Optimal Nutrition Delivery  Outcome: Progressing  Goal: Absence of Device-Related Skin and Tissue Injury  Outcome: Progressing  Goal: Absence of Ventilator-Induced Lung Injury  Outcome: Progressing  Intervention: Prevent Ventilator-Associated Pneumonia  Recent Flowsheet Documentation  Taken 03/11/2024 1523 by Rogue Bussing, RRT  Head of Bed [facility]) Positioning: HOB at 30-45 degrees  Oral Care: suction provided  Taken 03/11/2024 9528 by Rogue Bussing, RRT  Head of Bed [facility]) Positioning: HOB at 30-45 degrees  Oral Care:   mouth swabbed   oral rinse provided   suction provided   teeth brushed   tongue brushed

## 2024-03-12 LAB — APTT
APTT: 40.8 s — ABNORMAL HIGH (ref 24.8–38.4)
APTT: 45.7 s — ABNORMAL HIGH (ref 24.8–38.4)
APTT: 41.9 s — ABNORMAL HIGH (ref 24.8–38.4)
HEPARIN CORRELATION: 0.2
HEPARIN CORRELATION: 0.3
HEPARIN CORRELATION: 0.2

## 2024-03-12 LAB — BASIC METABOLIC PANEL
ANION GAP: 17 mmol/L — ABNORMAL HIGH (ref 5–14)
ANION GAP: 16 mmol/L — ABNORMAL HIGH (ref 5–14)
BLOOD UREA NITROGEN: 45 mg/dL — ABNORMAL HIGH (ref 9–23)
BLOOD UREA NITROGEN: 45 mg/dL — ABNORMAL HIGH (ref 9–23)
BUN / CREAT RATIO: 27
BUN / CREAT RATIO: 29
CALCIUM: 8.1 mg/dL — ABNORMAL LOW (ref 8.7–10.4)
CALCIUM: 8.3 mg/dL — ABNORMAL LOW (ref 8.7–10.4)
CHLORIDE: 99 mmol/L (ref 98–107)
CHLORIDE: 101 mmol/L (ref 98–107)
CO2: 23 mmol/L (ref 20.0–31.0)
CO2: 21 mmol/L (ref 20.0–31.0)
CREATININE: 1.67 mg/dL — ABNORMAL HIGH (ref 0.55–1.02)
CREATININE: 1.54 mg/dL — ABNORMAL HIGH (ref 0.55–1.02)
EGFR CKD-EPI (2021) FEMALE: 31 mL/min/{1.73_m2} — ABNORMAL LOW (ref >=60)
EGFR CKD-EPI (2021) FEMALE: 34 mL/min/{1.73_m2} — ABNORMAL LOW (ref >=60)
GLUCOSE RANDOM: 144 mg/dL — ABNORMAL HIGH (ref 70–99)
GLUCOSE RANDOM: 179 mg/dL (ref 70–179)
POTASSIUM: 4.3 mmol/L (ref 3.4–4.8)
POTASSIUM: 4.2 mmol/L (ref 3.4–4.8)
SODIUM: 139 mmol/L (ref 135–145)
SODIUM: 138 mmol/L (ref 135–145)

## 2024-03-12 LAB — BLOOD GAS CRITICAL CARE PANEL, ARTERIAL
BASE EXCESS ARTERIAL: -6.8 — ABNORMAL LOW (ref -2.0–2.0)
CALCIUM IONIZED ARTERIAL (MG/DL): 4.59 mg/dL (ref 4.40–5.40)
GLUCOSE WHOLE BLOOD: 171 mg/dL (ref 70–179)
HCO3 ARTERIAL: 18 mmol/L — ABNORMAL LOW (ref 22–27)
HEMOGLOBIN BLOOD GAS: 7.1 g/dL — ABNORMAL LOW (ref 12.00–16.00)
LACTATE BLOOD ARTERIAL: 1.6 mmol/L — ABNORMAL HIGH (ref <1.3)
O2 SATURATION ARTERIAL: 96.8 % (ref 94.0–100.0)
PCO2 ARTERIAL: 38.3 mmHg (ref 35.0–45.0)
PH ARTERIAL: 7.3 — ABNORMAL LOW (ref 7.35–7.45)
PO2 ARTERIAL: 95.7 mmHg (ref 80.0–110.0)
POTASSIUM WHOLE BLOOD: 4.4 mmol/L (ref 3.4–4.6)
SODIUM WHOLE BLOOD: 135 mmol/L (ref 135–145)

## 2024-03-12 LAB — HEPATIC FUNCTION PANEL
ALBUMIN: 1.9 g/dL — ABNORMAL LOW (ref 3.4–5.0)
ALKALINE PHOSPHATASE: 63 U/L (ref 46–116)
ALT (SGPT): <7 U/L — ABNORMAL LOW (ref 10–49)
AST (SGOT): 12 U/L (ref <=34)
BILIRUBIN DIRECT: 0.2 mg/dL (ref 0.00–0.30)
BILIRUBIN TOTAL: 0.4 mg/dL (ref 0.3–1.2)
PROTEIN TOTAL: 5.7 g/dL (ref 5.7–8.2)

## 2024-03-12 LAB — MAGNESIUM
MAGNESIUM: 2.2 mg/dL (ref 1.6–2.6)
MAGNESIUM: 2.1 mg/dL (ref 1.6–2.6)

## 2024-03-12 LAB — PRO-BNP: PRO-BNP: 5220 pg/mL — ABNORMAL HIGH (ref <=300.0)

## 2024-03-12 LAB — CBC
HEMATOCRIT: 21.4 % — ABNORMAL LOW (ref 34.0–44.0)
HEMOGLOBIN: 6.8 g/dL — ABNORMAL LOW (ref 11.3–14.9)
MEAN CORPUSCULAR HEMOGLOBIN CONC: 31.7 g/dL — ABNORMAL LOW (ref 32.0–36.0)
MEAN CORPUSCULAR HEMOGLOBIN: 26.8 pg (ref 25.9–32.4)
MEAN CORPUSCULAR VOLUME: 84.7 fL (ref 77.6–95.7)
MEAN PLATELET VOLUME: 10 fL (ref 6.8–10.7)
PLATELET COUNT: 211 10*9/L (ref 150–450)
RED BLOOD CELL COUNT: 2.53 10*12/L — ABNORMAL LOW (ref 3.95–5.13)
RED CELL DISTRIBUTION WIDTH: 17.9 % — ABNORMAL HIGH (ref 12.2–15.2)
WBC ADJUSTED: 17.4 10*9/L — ABNORMAL HIGH (ref 3.6–11.2)

## 2024-03-12 LAB — IONIZED CALCIUM VENOUS: CALCIUM IONIZED VENOUS (MG/DL): 4.66 mg/dL (ref 4.40–5.40)

## 2024-03-12 LAB — PHOSPHORUS
PHOSPHORUS: 5.2 mg/dL — ABNORMAL HIGH (ref 2.4–5.1)
PHOSPHORUS: 5.1 mg/dL (ref 2.4–5.1)

## 2024-03-12 MED ADMIN — atorvastatin (LIPITOR) tablet 40 mg: 40 mg | GASTROENTERAL | @ 13:00:00

## 2024-03-12 MED ADMIN — heparin 25,000 Units/250 mL (100 units/mL) in 0.45% saline infusion (premade): 0-24 [IU]/kg/h | INTRAVENOUS | @ 20:00:00 | Stop: 2024-03-12

## 2024-03-12 MED ADMIN — carboxymethylcellulose sodium (THERATEARS) 0.25 % ophthalmic solution 2 drop: 2 [drp] | OPHTHALMIC | @ 20:00:00

## 2024-03-12 MED ADMIN — insulin NPH (HumuLIN,NovoLIN) injection 5 Units: 5 [IU] | SUBCUTANEOUS | @ 13:00:00

## 2024-03-12 MED ADMIN — ceftolozane-tazobactam (ZERBAXA) 750 mg in sodium chloride (NS) 0.9 % 100 mL IVPB: 750 mg | INTRAVENOUS | @ 20:00:00 | Stop: 2024-03-18

## 2024-03-12 MED ADMIN — acetaminophen (TYLENOL) tablet 650 mg: 650 mg | GASTROENTERAL | @ 03:00:00

## 2024-03-12 MED ADMIN — sodium chloride (NS) 0.9 % flush 10 mL: 10 mL | INTRAVENOUS | @ 20:00:00

## 2024-03-12 MED ADMIN — budesonide (PULMICORT) nebulizer solution 0.5 mg: .5 mg | RESPIRATORY_TRACT | @ 01:00:00

## 2024-03-12 MED ADMIN — lactated ringers bolus 500 mL: 500 mL | INTRAVENOUS | @ 21:00:00 | Stop: 2024-03-12

## 2024-03-12 MED ADMIN — pantoprazole (Protonix) injection 40 mg: 40 mg | INTRAVENOUS | @ 13:00:00

## 2024-03-12 MED ADMIN — heparin (porcine) 1000 unit/mL injection 2,000 Units: 2000 [IU] | INTRAVENOUS | @ 10:00:00 | Stop: 2024-03-12

## 2024-03-12 MED ADMIN — ceftolozane-tazobactam (ZERBAXA) 750 mg in sodium chloride (NS) 0.9 % 100 mL IVPB: 750 mg | INTRAVENOUS | @ 04:00:00 | Stop: 2024-03-18

## 2024-03-12 MED ADMIN — propofol (DIPRIVAN) infusion 10 mg/mL: 0-30 ug/kg/min | INTRAVENOUS | @ 12:00:00

## 2024-03-12 MED ADMIN — ipratropium (ATROVENT) 0.02 % nebulizer solution 500 mcg: 500 ug | RESPIRATORY_TRACT | @ 07:00:00

## 2024-03-12 MED ADMIN — insulin NPH (HumuLIN,NovoLIN) injection 5 Units: 5 [IU] | SUBCUTANEOUS | @ 22:00:00

## 2024-03-12 MED ADMIN — aspirin chewable tablet 81 mg: 81 mg | GASTROENTERAL | @ 13:00:00

## 2024-03-12 MED ADMIN — ipratropium (ATROVENT) 0.02 % nebulizer solution 500 mcg: 500 ug | RESPIRATORY_TRACT | @ 16:00:00

## 2024-03-12 MED ADMIN — EPINEPHrine 8 mg in dextrose 5% 250 mL (32 mcg/mL) infusion PMB: 0-10 ug/min | INTRAVENOUS | @ 17:00:00

## 2024-03-12 MED ADMIN — carboxymethylcellulose sodium (THERATEARS) 0.25 % ophthalmic solution 2 drop: 2 [drp] | OPHTHALMIC | @ 13:00:00

## 2024-03-12 MED ADMIN — lactated ringers bolus 500 mL: 500 mL | INTRAVENOUS | @ 18:00:00 | Stop: 2024-03-12

## 2024-03-12 MED ADMIN — ipratropium (ATROVENT) 0.02 % nebulizer solution 500 mcg: 500 ug | RESPIRATORY_TRACT | @ 21:00:00

## 2024-03-12 MED ADMIN — ceftolozane-tazobactam (ZERBAXA) 750 mg in sodium chloride (NS) 0.9 % 100 mL IVPB: 750 mg | INTRAVENOUS | @ 13:00:00 | Stop: 2024-03-18

## 2024-03-12 MED ADMIN — ipratropium (ATROVENT) 0.02 % nebulizer solution 500 mcg: 500 ug | RESPIRATORY_TRACT | @ 01:00:00

## 2024-03-12 MED ADMIN — carboxymethylcellulose sodium (THERATEARS) 0.25 % ophthalmic solution 2 drop: 2 [drp] | OPHTHALMIC | @ 03:00:00

## 2024-03-12 MED ADMIN — acetaminophen (TYLENOL) tablet 650 mg: 650 mg | GASTROENTERAL | @ 10:00:00

## 2024-03-12 MED ADMIN — budesonide (PULMICORT) nebulizer solution 0.5 mg: .5 mg | RESPIRATORY_TRACT | @ 16:00:00

## 2024-03-12 MED ADMIN — NORepinephrine 8 mg in dextrose 5 % 250 mL (32 mcg/mL) infusion PMB: 0-10 ug/min | INTRAVENOUS | @ 08:00:00 | Stop: 2024-03-12

## 2024-03-12 MED ADMIN — acetaminophen (TYLENOL) tablet 650 mg: 650 mg | GASTROENTERAL | @ 15:00:00

## 2024-03-12 NOTE — Unmapped (Incomplete)
 STCCU PROGRESS NOTE     Date of Service: 03/12/2024    Hospital Day: LOS: 14 days        Surgery Date: TBD   Surgical Attending: Aris Georgia, MD    Critical Care Attending: Thalia Party, MD    Interval History:   Remains intubated. Awake and calm. Started on epi during day shift. Off levo and vaso.     History of Present Illness:   Tammy Braun is a 83 y.o. female with PMH bladder cancer with cystectomy and ileal conduit, COPD, recent dx of afib, moderate-severe TR, CAD (high calcification score), T2DM presented to [facility] with projectile vomiting and abdominal pain, found to have SBO with transition point at level of ileostomy on CT A/P. On initial presentation, labs remarkable for leukocytosis thought to be iso hemoconcentration, AKI on CKD, mildly elevated venous lactate improved with resuscitation. Admitted to [facility] for worsening hypoxia on HFNC requiring intubation in setting of hiatal hernia. OR on 02/28/24 for ex-lap, reduciton of parastomal hernia, small bowel resection with primary anastomosis, lysis of adhesions, abdomen left open with ABThera in place.     Hospital Course:  - 02/26/24: Admitted to [facility], floor status, consult to urology for possible ileal conduit revision.   - 02/27/24: Patient level of care escalated to step-down iso increased oxygen needs while in ED.  - 02/28/24: Upgraded to ICU. Intubated at bed-side. OR for ex-lap, reduction of parastomal hernia, small bowel resection with primary anastomosis, lysis of adhesions, abdomen left open with ABThera in place.   - 03/01/24: OR for attempt at abdominal closure. Left open due to desaturations during closure attempt. ABThera in place.  - 03/03/24: RTOR for abdominal closure  -03/06/24: Vas cath placed, CRRT started   -03/09/24: Extubated to HFNC  -03/10/24: CRRT discontinued   - 03/11/24: Re-intubated for hypoxic respiratory failure 2/2 mucus plug      ASSESSMENT & PLAN:     Neurologic:  - Pain: Oxy PRN, tylenol [facility]    #schizoaffective disorder  - Monthly prolixin injection (given 3/14)    Cardiovascular:  - MAP goal > 65    #HFpEF  - EPI for low cardiac index  - Vigileo: keep CI over 2.2  - ECHO 3/12- LVEF 70%, moderately dilated RV with moderate systolic dysfunction, severe pulmonary hypertension.  -Pro-BMP 3/13: 5220.0    #Hx of HTN  -holding home amlodipine    #Hx of HLD  - atorvastatin    #Afib/flutter w RVR:   - Prior hx of paroxysmal afib. CHADSvasc = 6, no AC PTA per chart review.      > Holding home diltiazem, question efficacy given likely poor absorption  - Resume 200mg  PO amio daily with HR in 110's  - LFTs to check for amio toxicity (WNL 3/13)  - heparin gtt  - Echo 3/1: LVEF > 55%. Severe TR.    #Pulmonary embolism  - 3/5 CTA chest: Filling defect in L anterior segmental artery  - heparin gtt     #Acute thrombus right basilic   - Identified 3/3 on BUE duplex  - Supportive care, superficial, AC not required     Respiratory:  #Acute on chronic hypoxemia  #Hx of COPD  -L Lung white out on CXR, Reintubated, bronched 3/12  -Bronch: Mucous plugging (white thick plugs on L side, collapsible fragile R lobe)  - Aggressive pulmonary toilet: bed precussion, albuterol/3%  - Continue incruse ellipta   - Tracheostomy option presented to family   Vent Mode:  PSV-CPAP  FiO2 (%): 40 %  S RR: 15  PEEP: 8 cm H20  PR SUP: 8 cm H20     3/12: CT chest, bilaterally collapsed lower lungs   -keep peep at 10    #Pseudomonas Pneumonia   #VAP  - see ID for abx plan    FEN/GI:  F: ML  E: Replete electrolytes PRN  N: NPO   - TF as tolerated  - GI prophylaxis: pepcid  - bowel regimen: resume senna and miralax   Last BM: 3/13    #S/p ex-lap, reduction of parastomal hernia, small bowel resection with primary anastomosis, lysis of adhesions  - 3/7 SBO, resolved  -3/13 -Partial small bowel obstruction    #Large hernia, chronic  - Non-operative, chronic  - re-eval on CT chest/abd/pelvis 3/13 stable      Renal/Genitourinary:  #AKI on CKD  #uremia   -Nephrology consulted  - Cr uptrending  - RUS : Bilateral renal cysts measuring up to 2.6 cm in the right upper pole and 1.9 cm left interpolar kidney   - CRRT discontinued 3/10. Leave in [place], will need dialyzed in the future.  - No urgent HD needs at this time 3/12 per Nephro, no electrolyte abnormality at this time  -BMP q8    Intake/Output Summary (Last 24 hours) at 03/12/2024 1916  Last data filed at 03/12/2024 1800  Gross per 24 hour   Intake 2591.1 ml   Output 515 ml   Net 2076.1 ml        #H/o bladder cancer s/p radical cystectomy with ileal conduit (2012):  #Parastomal hernia:  - Strict I&Os  - Foley catheter per stoma, if it falls out, please notify Urology to replace      Endocrine:   #hyperglycemia   - Glycemic Control: SSI, 5 units NPH BID    Hematologic:   - Monitor CBC daily, transfuse for hgb 7mg /dL  Recent Labs     54/09/81  1825 03/12/24  0338   HGB 7.1* 6.8*    -1 unit PRBC 3/13  - DVT ppx: heparin gtt    Immunologic/Infectious Disease:  - Afebrile  - Persistent leukocytosis, continue to monitor   Recent Labs     03/11/24  1825 03/12/24  0338   WBC 23.8* 17.4*        - Cultures:   - Blood cultures (3/4): negative at 5 days   - BAL (3/4): Pseudomonas aeruginosa - susceptible to meropenum    - BAL (3/8): Pseudomonas aeruginosa and candida glabrata  -3/12: Susceptibilities show resistance to Meropenum, Cefepime, Ceftaz and Zosyn.   - Bcx (3/11): NGTD @ 24h  - BAL (3/12): 600,000+ psuedomonas (susceptibilities pending)  - Ucx (3/13): pending - testing for colonization of candida glabrata    3/12: CT chest, abdomen, pelvis noncon , reads pending    - Antimicrobials:    - Ceftriaxone 2/27- 2/28 - colonized UTI   - Zosyn (3/1 - 3/5) for abdominal contamination  - Zosyn (3/6) for pseudomonas when sensitivities pending  - Zerbaxa (3/12- ) for 7-10 day course  - ID consulted 3/12, recs pending    Musculoskeletal:   #Deconditioned   - PT/OT consulted     Daily Care Checklist:   - Stress Ulcer Prevention: No  - DVT Prophylaxis: Chemical: Heparin drip  - Daily Awakening: Yes  - Spontaneous Breathing Trial: Yes  - Indication for Central/PICC Line: Yes  Infusions requiring central access, Hemodynamic monitoring, and Inadequate peripheral access, RIJ triple lumen  -  Indication for Urinary Catheter: Yes  Strict intake and output, Agressive diuresis/hydration, and Urinary retention/obstruction  - Diagnostic images/reports of past 24hrs reviewed: Yes    Disposition:   - Continue ICU care  - PT/OT consulted:yes     SUBJECTIVE:    Alert and appropriate. Follows commands. Denies pain. Intubated.      OBJECTIVE:     Physical Exam:  Constitutional: Lying supine, NAD  Neurologic: Opens eyes to voice. Moves all 4 extremities to command. Pupils equal and responsive to light.   Respiratory: Mechanically ventilated, diminished bilaterally  Cardiovascular: Bradycardic, regular rhythm s1, s2  Gastrointestinal: Soft abdomen, midline incision staples in tact. Urostomy with Foley in stoma draining clear yellow urine.   Musculoskeletal: Trace BLE/BUE edema.  Skin: Warm and dry     Temp:  [36.7 ??C (98 ??F)-37.1 ??C (98.7 ??F)] 36.7 ??C (98 ??F)  Heart Rate:  [42-127] 99  SpO2 Pulse:  [44-125] 100  Resp:  [10-28] 20  BP: (84-147)/(37-57) 84/47  MAP (mmHg):  [55-86] 55  A BP-1: (61-165)/(39-67) 122/56  MAP:  [58 mmHg-222 mmHg] 76 mmHg  FiO2 (%):  [40 %] 40 %  SpO2:  [94 %-100 %] 100 %  CVP:  [0 mmHg-13 mmHg] 7 mmHg  Recent Laboratory Results:  No results for input(s): SPECTYPEART, PHART, PCO2ART, PO2ART, HCO3ART, BEART, O2SATART in the last 24 hours.    Recent Labs     Units 03/11/24  1825 03/12/24  0338 03/12/24  1604   NA mmol/L 137 - 136 139 138   K mmol/L 4.5 - 4.1 4.3 4.2   CL mmol/L 99 99 101   CO2 mmol/L 22.0 23.0 21.0   BUN mg/dL 46* 45* 45*   CREATININE mg/dL 3.66* 4.40* 3.47*   GLU mg/dL 425* 956* 387     Lab Results   Component Value Date    BILITOT 0.4 03/12/2024    BILITOT 0.2 (L) 03/08/2024    BILIDIR 0.20 03/12/2024    BILIDIR 0.10 03/08/2024    ALT <7 (L) 03/12/2024    ALT <7 (L) 03/08/2024    AST 12 03/12/2024    AST 10 03/08/2024    GGT 16 09/17/2011    GGT 354 (H) 05/03/2011    ALKPHOS 63 03/12/2024    ALKPHOS 53 03/08/2024    PROT 5.7 03/12/2024    PROT 5.4 (L) 03/08/2024    ALBUMIN 1.9 (L) 03/12/2024    ALBUMIN 2.0 (L) 03/08/2024     Recent Labs     Units 03/12/24  0007 03/12/24  0836 03/12/24  1325 03/12/24  1330   POCGLU mg/dL 564 332 58* 951     Recent Labs     Units 03/11/24  0309 03/11/24  1825 03/12/24  0338   WBC 10*9/L 22.5* 23.8* 17.4*   RBC 10*12/L 2.81* 2.62* 2.53*   HGB g/dL 7.7* 7.1* 6.8*   HCT % 23.6* 22.2* 21.4*   MCV fL 84.2 84.9 84.7   MCH pg 27.4 27.2 26.8   MCHC g/dL 88.4 16.6 06.3*   RDW % 17.8* 17.6* 17.9*   PLT 10*9/L 192 206 211   MPV fL 9.8 10.1 10.0     Recent Labs     Units 03/12/24  0338 03/12/24  1303   APTT sec 40.8* 45.7*      Lines & Tubes:   Patient Lines/Drains/Airways Status       Active Peripheral & Central Intravenous Access       Name Placement date Placement time  Site Days    Peripheral IV 03/04/24 Anterior;Proximal;Right Forearm 03/04/24  0021  Forearm  8    CVC Triple Lumen 03/07/24 Non-tunneled Left Internal jugular 03/07/24  0849  Internal jugular  5    Hemodialysis Catheter With Distal Infusion Port 03/06/24 Right Internal jugular 1.2 mL 1.2 mL 03/06/24  2301  Internal jugular  5                     Patient Lines/Drains/Airways Status       Active Wounds       Name Placement date Placement time Site Days    Surgical Site 04/08/18 Shoulder Left 04/08/18  1338  -- 2165    Surgical Site 06/05/22 Eye Left 06/05/22  0928  -- 646    Surgical Site 06/19/22 Eye Right 06/19/22  1024  -- 632    Surgical Site 03/03/24 Abdomen 03/03/24  1013  -- 9    Wound 06/24/20 Other (comment) Buttocks inner right buttock 06/24/20  1010  Buttocks  1357    Wound 03/11/24 Other (comment) Sacrum Mid moisture/friction injury 03/11/24  1200  Sacrum  1    Wound 03/12/24 Other (comment) Groin Right 03/12/24  1500  Groin  less than 1                     Respiratory/ventilator settings for last 24 hours:   Vent Mode: PSV-CPAP  FiO2 (%): 40 %  S RR: 15  PEEP: 8 cm H20  PR SUP: 8 cm H20    Intake/Output last 3 shifts:  I/O last 3 completed shifts:  In: 3158.9 [I.V.:1131.1; Blood:25; NG/GT:440; IV Piggyback:1562.8]  Out: 1230 [Urine:805; Emesis/NG output:300; Drains:125]    Daily/Recent Weight:  68 kg (149 lb 14.6 oz)    BMI:  Body mass index is 28.34 kg/m??.    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    Chronic kidney disease     Depression, psychotic (CMS-HCC)     Diabetes mellitus (CMS-HCC)     in past    Emphysema of lung (CMS-HCC)     Financial difficulties     Frail elderly     Hearing impairment     Hernia     History of transfusion     Hyperlipidemia     Hypertension     Impaired mobility     Osteoporosis     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/08/2015    Visual impairment      Past Surgical History:   Procedure Laterality Date    ABDOMINAL SURGERY      BLADDER SURGERY      BREAST CYST EXCISION      CHEMOTHERAPY  2012    bladder    GALLBLADDER SURGERY      stone removal    ILEOSTOMY  2012    PR COLONOSCOPY FLX DX W/COLLJ SPEC WHEN PFRMD N/A 02/09/2015    Procedure: COLONOSCOPY, FLEXIBLE, PROXIMAL TO SPLENIC FLEXURE; DIAGNOSTIC, W/WO COLLECTION SPECIMEN BY BRUSH OR WASH;  Surgeon: Dewaine Conger, MD;  Location: [facility] [place];  Service: Gastroenterology    PR EXPLORATORY OF ABDOMEN N/A 02/28/2024    Procedure: EXPLORATORY LAPAROTOMY, EXPLORATORY CELIOTOMY WITH OR WITHOUT BIOPSY(S);  Surgeon: Renda Rolls, MD;  Location: [facility] OR [facility];  Service: General Surgery    PR RECONSTR TOTAL SHOULDER IMPLANT Left 04/08/2018    Procedure: ARTHROPLASTY, GLENOHUMERAL JOINT; TOTAL SHOULDER(GLENOID & PROXIMAL HUMERAL REPLACEMENT(EG, TOTAL SHOULDER);  Surgeon: Tomasa Rand, MD;  Location: [facility] OR [facility];  Service: Ortho Sports Medicine    PR REOPEN RECENT ABD EXPLORATORY Midline 03/01/2024    Procedure: REOPENING OF RECENT LAPAROTOMY;  Surgeon: [name]., MD;  Location: OR [facility];  Service: Trauma    PR REOPEN RECENT ABD EXPLORATORY N/A 03/03/2024    Procedure: REOPENING OF RECENT LAPAROTOMY;  Surgeon: Joanie Coddington, MD;  Location: OR [facility];  Service: Trauma    PR SIGMOIDOSCOPY,BIOPSY N/A 03/11/2015    Procedure: SIGMOIDOSCOPY, FLEXIBLE; WITH BIOPSY, SINGLE OR MULTIPLE;  Surgeon: Wilburt Finlay, MD;  [facility];  Service: Gastroenterology    PR XCAPSL CTRC RMVL INSJ IO LENS PROSTH W/O ECP Left 06/05/2022    Procedure: EXTRACAPSULAR CATARACT REMOVAL W/INSERTION OF INTRAOCULAR LENS PROSTHESIS, MANUAL OR MECHANICAL TECHNIQUE WITHOUT ENDOSCOPIC CYCLOPHOTOCOAGULATION;  Surgeon: Garner Gavel, MD;  Location: [facility] OR [facility];  Service: Ophthalmology    PR XCAPSL CTRC RMVL INSJ IO LENS PROSTH W/O ECP Right 06/19/2022    Procedure: EXTRACAPSULAR CATARACT REMOVAL W/INSERTION OF INTRAOCULAR LENS PROSTHESIS, MANUAL OR MECHANICAL TECHNIQUE WITHOUT ENDOSCOPIC CYCLOPHOTOCOAGULATION;  Surgeon: Garner Gavel, MD;  Location: [facility] OR [facility];  Service: Ophthalmology     Scheduled Medications:   acetaminophen  650 mg Enteral tube: gastric Q6H    [Provider Hold] amiodarone  150 mg Intravenous daily    aspirin  81 mg Enteral tube: gastric Daily    atorvastatin  40 mg Enteral tube: gastric Daily    budesonide (PULMICORT) nebulizer solution  0.5 mg Nebulization BID (RT)    carboxymethylcellulose sodium  2 drop Both Eyes TID    ceftolozane-tazobactam  750 mg Intravenous Q8H    flu vacc ts2024-25(58yr up)-PF  0.5 mL Intramuscular During hospitalization    [START ON 03/14/2024] fluPHENAZine decanoate  50 mg Intramuscular Q30 Days    insulin NPH  5 Units Subcutaneous BID AC    insulin regular  0-20 Units Subcutaneous Q6H SCH    ipratropium  500 mcg Nebulization Q6H (RT)    pantoprazole (Protonix) intravenous solution  40 mg Intravenous Daily    polyethylene glycol  17 g Enteral tube: gastric Daily    sennosides  2.5 mL Enteral tube: gastric Nightly    sodium chloride  10 mL Intravenous Q8H    sodium chloride  10 mL Intravenous Q8H    sodium chloride  10 mL Intravenous Q8H     Continuous Infusions:   EPINEPHrine 3 mcg/min (03/12/24 1800)    heparin 16 Units/kg/hr (03/12/24 1800)    NORepinephrine bitartrate-NS Stopped (03/12/24 1605)    NxStage RFP 400 (+/- BB) 5000 mL - contains 2 mEq/L of potassium      NxStage/multiBic RFP 401 (+/- BB) 5000 mL - contains 4 mEq/L of potassium      propofol 10 mg/mL infusion 10 mcg/kg/min (03/12/24 1800)     PRN Medications:  albuterol, [Provider Hold] cyclobenzaprine, dextrose in water, fentaNYL (PF) **OR** fentaNYL (PF), heparin (porcine), heparin (porcine), nicotine polacrilex **OR** nicotine polacrilex, ondansetron, oxyCODONE **OR** oxyCODONE    Ailine Hefferan is critically ill due to acute kidney injury, altered mental status, atrial fibrillation, bradycardia, coagulopathy, hypotension, pneumonia, pulmonary embolism, and renal insufficiency,    . This critical care  time includes examining the patient, evaluating the hemodynamic, laboratory, and radiographic data, independently developing a comprehensive management plan, and serially assessing the patient's response to these critical care interventions. This critical care time excludes procedures.    Critical care time: 45 minutes     Katina Degree, ACNP   Surgical Intensive Care Unit  [place] [facility] [place] at [facility] excludes procedures.    Critical care time: 90 minutes     Katina Degree, ACNP   Surgical Intensive Care Unit  [place] at [facility]

## 2024-03-12 NOTE — Unmapped (Signed)
 Patient remained on PSV 8/+10, no SBT due to peep>8. Tube holder changed, sx moderate thick tan secretions. ETT secure and patent, NAD noted.    Problem: Artificial Airway  Goal: Effective Communication  Outcome: Ongoing - Unchanged     Problem: Mechanical Ventilation Invasive  Goal: Effective Communication  Outcome: Ongoing - Unchanged

## 2024-03-12 NOTE — Unmapped (Signed)
 WOCN Consult Services                                                 Wound Evaluation: Pressure Injury    Reason for Consult:   - Initial  - Pressure Injury    Problem List:   Principal Problem:    SBO (small bowel obstruction) (CMS-HCC)    Assessment: Per EMR: Tammy Braun is a 83 y.o. female with PMH bladder cancer with cystectomy and ileal conduit, COPD, recent dx of afib, moderate-severe TR, CAD (high calcification score), T2DM presented to [facility] with projectile vomiting and abdominal pain, found to have SBO with transition point at level of ileostomy on CT A/P. On initial presentation, labs remarkable for leukocytosis thought to be iso hemoconcentration, AKI on CKD, mildly elevated venous lactate improved with resuscitation. Admitted to [facility] for worsening hypoxia on HFNC requiring intubation in setting of hiatal hernia. OR on 02/28/24 for ex-lap, reduciton of parastomal hernia, small bowel resection with primary anastomosis, lysis of adhesions, abdomen left open with ABThera in place.     Patient assessed in bed for pressure injury. Family at bedside and on phone. Assisted to turn with CNA. Wounds to L buttock and mid-coccyx pink/red in color, and blanchable. Family has been treating at home with Neosporin and Zinc Paste. Wounds appear to be friction/moisture related. CWOCN crusted with anti-fungal powder and 64M no-sting skin barrier. A thick layer of zinc was then applied. Patient turned and sheets changed. There is another friction/moisture related wound to the inner R groin that family has been treating at home in the same fashion as above. This was also crusted and zinc applied. Recommend daily reapplications of crusting/zinc. Urostomy appliance also changed this visit. Stoma was intubated, but catheter with balloon intact was unintentionally removed during pouch change. Urology consulted and acceptable to leave catheter out at this time. Patient was pouched with a blue flat 2 piece pouching system. A barrier ring was used around the stoma, and, per family suggestion, barrier strips were placed circumferentially around the ostomy appliance. Patient left turned with pillows, bed in lowest/locked position, and call bell in reach. Will continue to follow.     Wound 03/11/24 Other (comment) Sacrum Mid moisture/friction injury (Active)   Properties   Placement Date 03/11/24   Placement Time 1200   Location Sacrum   Present on Original Admission Y   Primary Wound Type Other (moisture/friction)   Wound Location Orientation Mid   Wound Description (Comments) moisture/friction injury   Medical Device Related Pressure Injury No   Staged By WOCN no   LIP Notified Of Pressure Injury No      Assessments 03/12/2024  3:15 PM   Wound Image     Dressing Status      Changed   State of Healing Early/partial granulation   Wound Bed Pink;Red   Odor None   Peri-wound Assessment      Intact   Exudate Type      Sero-sanguineous   Exudate Amnt      Small   Tunneling      No   Undermining     No   Treatments Cleansed/Irrigation;Zinc based products   Dressing Other (Comment) (crusting/zinc)       Wound 03/12/24 Other (comment) Groin Right (Active)   Properties   Placement Date 03/12/24   Placement  Time 1500   Location Groin   Present on Original Admission Y   Primary Wound Type Other (moisture/friction)   Wound Location Orientation Right   Medical Device Related Pressure Injury No   Staged By WOCN no   LIP Notified Of Pressure Injury No      Assessments 03/12/2024  3:15 PM   Wound Image     Dressing Status      Changed   State of Healing Early/partial granulation   Wound Bed Pink;Red   Odor None   Peri-wound Assessment      Intact   Exudate Type      Sero-sanguineous   Exudate Amnt      Small   Tunneling      No   Undermining     No   Treatments Cleansed/Irrigation;Zinc based products   Dressing Other (Comment) (crusting)         Continence Status:   Incontinence of bladder: Foley in place  Incontinent of bowel: Bowel program in place    Moisture Associated Skin Damage:   - Incontinence-associated dermatitis (IAD)     Lab Results   Component Value Date    WBC 17.4 (H) 03/12/2024    HGB 6.8 (L) 03/12/2024    HCT 21.4 (L) 03/12/2024    ESR 53 (H) 12/11/2023    CRP <4.0 12/11/2023    A1C 6.8 (H) 12/11/2023    GLUF 105 08/04/2013    GLU 144 (H) 03/12/2024    POCGLU 127 03/12/2024    ALBUMIN 1.9 (L) 03/12/2024    PROT 5.7 03/12/2024     Risk Factors:   - Aging  - Cognitive Impairment  - Extended Hospitalization  - Friction/shear  - Immobility  - Moisture  - Multiple co-morbidities    Braden Scale Score: 14         Support Surface:   - Low Air Loss - ICU    Type Debridement Completed By WOCN:  N/A    Teaching:  - Moisture management  - Turning and repositioning  - Wound care    WOCN Recommendations:   - See nursing orders for wound care instructions.  - Contact WOCN with questions, concerns, or wound deterioration.    Topical Therapy/Interventions:   - Crusting (stoma powder or antifungal powder)  - Zinc oxide barrier cream     Urostomy In-patient supplies:   Inpatient supply list: take home quantity listed     Pouch System:  Inpatient supply list: take home quantity listed     Inpatient supply list: take home quantity listed     Pouch System:  Hollister 2-Piece CTF Flat Wafer Blue (mfg [id number]) - lawson number [phone]) order 5  [employer]ister 2-Piece High Output Pouch with Spout Blue (mfg [id number])- lawson number [phone]) order 5      Accessories:   Copywriter, advertising (mfg [id number]) - lawson number [phone]) order 5   Coloplast Brava Elastic Strip (mfg [id number]) - lawson number [phone]) order 10   Esenta ConvaTec Adhesive Remover Wipes (mfg [id number]) - lawson number [phone]) order 1 box   Hollister Stoma Powder (mfg [id number]) - lawson number [phone]) order 1   39M No-Sting Barrier Film- Pads (mfg [id number]) - lawson number ([id number]) order 1 box              Recommended Consults:  - Not Applicable    WOCN Follow Up:  - Weekly    Plan of Care Discussed With:   - Patient  - Family  -  RN Primary    Supplies Ordered: No    Workup Time:   45 minutes    Ovidio Kin, RN, BSN, [place], [place]

## 2024-03-12 NOTE — Unmapped (Signed)
 Copied from CRM #[id number]. Topic: Access To Clinicians - Req Clinic Call Back  >> Mar 12, 2024  3:55 PM Leamon Arnt wrote:  [facility] health is calling to get an update on the order form that was faxed.   Once the form is completed please fax it back and the number [phone]

## 2024-03-12 NOTE — Unmapped (Signed)
 [facility] PROGRESS NOTE     Date of Service: 03/12/2024    Hospital Day: LOS: 14 days        Surgery Date: TBD   Surgical Attending: Aris Georgia, MD    Critical Care Attending: Thalia Party, MD    Interval History:   This morning: Vaso off, NE at 7, On vigileo CI noted to be 1.6, CVP of 6, 500cc LR given. EPI started. Goal is to achieve CI 2.2.  Morning amiodarone held due to bradycardia. LFTs and Pro-BNP sent. AM Hgb 6.8, 1 unit PRBC given.     Additional 500c LR given this afternoon for high SVRI. NE off. EPI at 3    3/12 CT Chest: multifocal aspiration pneumonia. small bilateral pleural effusions and complete collapse of the bilateral lower lobes. Dilated right atrium. Dilated main pulmonary artery     3/12 CT abdomen: large left/hiatal hernia containing entire stomach and transverse colon. -Partial small bowel obstruction. Small-volume abdominopelvic ascites     History of Present Illness:   Tammy Braun is a 83 y.o. female with PMH bladder cancer with cystectomy and ileal conduit, COPD, recent dx of afib, moderate-severe TR, CAD (high calcification score), T2DM presented to [facility] with projectile vomiting and abdominal pain, found to have SBO with transition point at level of ileostomy on CT A/P. On initial presentation, labs remarkable for leukocytosis thought to be iso hemoconcentration, AKI on CKD, mildly elevated venous lactate improved with resuscitation. Admitted to [facility] for worsening hypoxia on HFNC requiring intubation in setting of hiatal hernia. OR on 02/28/24 for ex-lap, reduciton of parastomal hernia, small bowel resection with primary anastomosis, lysis of adhesions, abdomen left open with ABThera in place.     Hospital Course:  - 02/26/24: Admitted to [facility], floor status, consult to urology for possible ileal conduit revision.   - 02/27/24: Patient level of care escalated to step-down iso increased oxygen needs while in ED.  - 02/28/24: Upgraded to ICU. Intubated at bed-side. OR for ex-lap, reduction of parastomal hernia, small bowel resection with primary anastomosis, lysis of adhesions, abdomen left open with ABThera in place.   - 03/01/24: OR for attempt at abdominal closure. Left open due to desaturations during closure attempt. ABThera in place.  - 03/03/24: RTOR for abdominal closure  -03/06/24: Vas cath placed, CRRT started   -03/09/24: Extubated to HFNC  -03/10/24: CRRT discontinued   - 03/11/24: Re-intubated for hypoxic respiratory failure 2/2 mucus plug      ASSESSMENT & PLAN:     Neurologic:  - Pain: Oxy PRN, tylenol [facility]    Cardiovascular:  - MAP goal > 65    #HFpEF  - NE, added EPI for low cardiac index  - Vigileo: keep CI over 2.2  - ECHO 3/12- LVEF 70%, moderately dilated RV with moderate systolic dysfunction, severe pulmonary hypertension.  -Pro-BMP 3/13: 5220.0    #Hx of HTN  -holding home amlodipine    #Hx of HLD  - atorvastatin    #Afib/flutter w RVR:   - Prior hx of paroxysmal afib. CHADSvasc = 6, no AC PTA per chart review.      > Holding home diltiazem, question efficacy given likely poor absorption  - Holding amio 150 mg IV daily for bradycardia  - LFTs to check for amio toxicity  - heparin gtt  - Echo 3/1: LVEF > 55%. Severe TR.  - Pro-BNP: 6,988 on 3/3     #Pulmonary embolism  - 3/5 CTA chest: Filling defect in L  anterior segmental artery  - heparin gtt     #Acute thrombus right basilic   - Identified 3/3 on BUE duplex  - Supportive care, superficial, AC not required     Respiratory:  #Acute on chronic hypoxemia  #Hx of COPD  -L Lung white out on CXR, Reintubated, bronched 3/12  -Bronch: Mucous plugging (white thick plugs on L side, collapsible fragile R lobe)  - Aggressive pulmonary toilet: bed precussion, albuterol/3%  - Continue incruse ellipta   - Tracheostomy option presented to family   Vent Mode: PSV-CPAP  FiO2 (%): 40 %  S RR: 15  S VT: 300 mL  PEEP: 10 cm H20  PR SUP: 8 cm H20     3/12: CT chest, bilaterally collapsed lower lungs   -keep peep at 10    #Pseudomonas Pneumonia   #VAP  - see ID for abx plan    FEN/GI:  F: ML  E: Replete electrolytes PRN  N: NPO   - TF as tolerated  - GI prophylaxis: pepcid  - bowel regimen: resume senna and miralax   Last BM: 3/12    #S/p ex-lap, reduction of parastomal hernia, small bowel resection with primary anastomosis, lysis of adhesions  - 3/7 SBO, resolved  -3/13 -Partial small bowel obstruction    #Large hernia, chronic  - Non-operative, chronic  - re-eval on CT chest/abd/pelvis 3/13 stable      Renal/Genitourinary:  #AKI on CKD  #uremia   -Nephrology consulted  - Cr uptrending  - RUS : Bilateral renal cysts measuring up to 2.6 cm in the right upper pole and 1.9 cm left interpolar kidney   - CRRT discontinued 3/10. Leave in [place], will need dialyzed in the future.  - No urgent HD needs at this time 3/12 per Nephro, no electrolyte abnormality at this time  -BMP q8    Intake/Output Summary (Last 24 hours) at 03/12/2024 1610  Last data filed at 03/12/2024 0600  Gross per 24 hour   Intake 1152.32 ml   Output 980 ml   Net 172.32 ml        #H/o bladder cancer s/p radical cystectomy with ileal conduit (2012):  #Parastomal hernia:  - Strict I&Os  - Foley catheter per stoma, if it falls out, please notify Urology to replace      Endocrine:   #hyperglycemia   - Glycemic Control: SSI, 5 units NPH BID    Hematologic:   - Monitor CBC daily, transfuse for hgb 7mg /dL  Recent Labs     96/04/54  1825 03/12/24  0338   HGB 7.1* 6.8*    -1 unit PRBC  - DVT ppx: heparin gtt    Immunologic/Infectious Disease:  - Afebrile  - Persistent leukocytosis, continue to monitor   Recent Labs     03/11/24  1825 03/12/24  0338   WBC 23.8* 17.4*        - Cultures:   - Blood cultures (3/4): negative at 5 days   - BAL (3/4): Pseudomonas aeruginosa - susceptible to meropenum    - BAL (3/8): Pseudomonas aeruginosa and candida glabrata  -3/12: Susceptibilities show resistance to Meropenum, Cefepime, Ceftaz and Zosyn.   - Bcx (3/11): NGTD @ 24h  - BAL (3/12): 2-4+ PML  - Ucx (3/13): pending - testing for colonization of candida glabrata    3/12: CT chest, abdomen, pelvis noncon , reads pending    - Antimicrobials:    - Ceftriaxone 2/27- 2/28 - colonized UTI   - Zosyn (  3/1 - 3/5) for abdominal contamination  - Zosyn (3/6) for pseudomonas when sensitivities pending  - Zerbaxa (3/12- ) for 7-10 day course  - ID consulted 3/12, recs pending    Musculoskeletal:   #Deconditioned   - PT/OT consulted     Daily Care Checklist:   - Stress Ulcer Prevention: No  - DVT Prophylaxis: Chemical: Heparin drip  - Daily Awakening: Yes  - Spontaneous Breathing Trial: Yes  - Indication for Central/PICC Line: Yes  Infusions requiring central access, Hemodynamic monitoring, and Inadequate peripheral access, RIJ triple lumen  - Indication for Urinary Catheter: Yes  Strict intake and output, Agressive diuresis/hydration, and Urinary retention/obstruction  - Diagnostic images/reports of past 24hrs reviewed: Yes    Disposition:   - Continue ICU care  - PT/OT consulted:yes     SUBJECTIVE:    Alert and appropriate. Follows commands. Denies pain. Intubated.      OBJECTIVE:     Physical Exam:  Constitutional: Lying supine, NAD  Neurologic: Opens eyes to voice. Moves all 4 extremities to command. Pupils equal and responsive to light.   Respiratory: Mechanically ventilated, diminished bilaterally  Cardiovascular: Bradycardic, regular rhythm s1, s2  Gastrointestinal: Soft abdomen, midline incision staples in tact. Urostomy with Foley in stoma draining clear yellow urine.   Musculoskeletal: Trace BLE/BUE edema.  Skin: Warm and dry     Temp:  [37 ??C (98.6 ??F)-37.7 ??C (99.9 ??F)] 37.1 ??C (98.7 ??F)  Heart Rate:  [42-65] 48  SpO2 Pulse:  [45-65] 45  Resp:  [12-27] 18  BP: (106-152)/(39-73) 127/45  MAP (mmHg):  [59-96] 72  A BP-1: (61-174)/(39-98) 143/50  MAP:  [58 mmHg-101 mmHg] 74 mmHg  FiO2 (%):  [40 %-60 %] 40 %  SpO2:  [89 %-100 %] 100 %  CVP:  [0 mmHg-13 mmHg] 11 mmHg  Recent Laboratory Results:  Recent Labs     Units 03/11/24  1825   PHART  7.40 PCO2ART mm Hg 35.6   PO2ART mm Hg 117.0*   HCO3ART mmol/L 22   BEART  -2.8*   O2SATART % 98.5     Recent Labs     Units 03/11/24  0309 03/11/24  0441 03/11/24  1825 03/12/24  0338   NA mmol/L 139   < > 137 - 136 139   K mmol/L 4.0   < > 4.5 - 4.1 4.3   CL mmol/L 100  --  99 99   CO2 mmol/L 25.0  --  22.0 23.0   BUN mg/dL 42*  --  46* 45*   CREATININE mg/dL 2.95*  --  6.21* 3.08*   GLU mg/dL 657  --  846* 962*    < > = values in this interval not displayed.     Lab Results   Component Value Date    BILITOT 0.2 (L) 03/08/2024    BILITOT 0.2 (L) 03/03/2024    BILIDIR 0.10 03/08/2024    BILIDIR 0.10 03/03/2024    ALT <7 (L) 03/08/2024    ALT 9 (L) 03/03/2024    AST 10 03/08/2024    AST 22 03/03/2024    GGT 16 09/17/2011    GGT 354 (H) 05/03/2011    ALKPHOS 53 03/08/2024    ALKPHOS 58 03/03/2024    PROT 5.4 (L) 03/08/2024    PROT 5.5 (L) 03/03/2024    ALBUMIN 2.0 (L) 03/08/2024    ALBUMIN 2.4 (L) 03/03/2024     Recent Labs     Units 03/11/24  1314 03/11/24  1823   POCGLU mg/dL 093 235     Recent Labs     Units 03/11/24  0309 03/11/24  1825 03/12/24  0338   WBC 10*9/L 22.5* 23.8* 17.4*   RBC 10*12/L 2.81* 2.62* 2.53*   HGB g/dL 7.7* 7.1* 6.8*   HCT % 23.6* 22.2* 21.4*   MCV fL 84.2 84.9 84.7   MCH pg 27.4 27.2 26.8   MCHC g/dL 57.3 22.0 25.4*   RDW % 17.8* 17.6* 17.9*   PLT 10*9/L 192 206 211   MPV fL 9.8 10.1 10.0     Recent Labs     Units 03/11/24  1314 03/12/24  0338   APTT sec 48.2* 40.8*      Lines & Tubes:   Patient Lines/Drains/Airways Status       Active Peripheral & Central Intravenous Access       Name Placement date Placement time Site Days    Peripheral IV 03/04/24 Anterior;Proximal;Right Forearm 03/04/24  0021  Forearm  8    CVC Triple Lumen 03/07/24 Non-tunneled Left Internal jugular 03/07/24  0849  Internal jugular  4    Hemodialysis Catheter With Distal Infusion Port 03/06/24 Right Internal jugular 1.2 mL 1.2 mL 03/06/24  2301  Internal jugular  5                     Patient Lines/Drains/Airways Status Active Wounds       Name Placement date Placement time Site Days    Surgical Site 04/08/18 Shoulder Left 04/08/18  1338  -- 2164    Surgical Site 06/05/22 Eye Left 06/05/22  0928  -- 645    Surgical Site 06/19/22 Eye Right 06/19/22  1024  -- 631    Surgical Site 03/03/24 Abdomen 03/03/24  1013  -- 8    Wound 06/24/20 Other (comment) Buttocks inner right buttock 06/24/20  1010  Buttocks  1356    Wound 03/11/24 Pressure Injury Sacrum Mid possible stage 2 PI to gluteal cleft Stage 2 03/11/24  1200  Sacrum  less than 1                     Respiratory/ventilator settings for last 24 hours:   Vent Mode: PSV-CPAP  FiO2 (%): 40 %  S RR: 15  S VT: 300 mL  PEEP: 10 cm H20  PR SUP: 8 cm H20    Intake/Output last 3 shifts:  I/O last 3 completed shifts:  In: 2503.9 [I.V.:843.9; NG/GT:1360; IV Piggyback:300]  Out: 1345 [Urine:800; Emesis/NG output:300; Drains:245]    Daily/Recent Weight:  68 kg (149 lb 14.6 oz)    BMI:  Body mass index is 28.34 kg/m??.    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    Chronic kidney disease     Depression, psychotic (CMS-HCC)     Diabetes mellitus (CMS-HCC)     in past    Emphysema of lung (CMS-HCC)     Financial difficulties     Frail elderly     Hearing impairment     Hernia     History of transfusion     Hyperlipidemia     Hypertension     Impaired mobility     Osteoporosis  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/08/2015    Visual impairment      Past Surgical History:   Procedure Laterality Date    ABDOMINAL SURGERY      BLADDER SURGERY      BREAST CYST EXCISION      CHEMOTHERAPY  2012    bladder    GALLBLADDER SURGERY      stone removal    ILEOSTOMY  2012    PR COLONOSCOPY FLX DX W/COLLJ SPEC WHEN PFRMD N/A 02/09/2015    Procedure: COLONOSCOPY, FLEXIBLE, PROXIMAL TO SPLENIC FLEXURE; DIAGNOSTIC, W/WO COLLECTION SPECIMEN BY BRUSH OR WASH; Surgeon: Dewaine Conger, MD;  Location: [facility] [place];  Service: Gastroenterology    PR EXPLORATORY OF ABDOMEN N/A 02/28/2024    Procedure: EXPLORATORY LAPAROTOMY, EXPLORATORY CELIOTOMY WITH OR WITHOUT BIOPSY(S);  Surgeon: Renda Rolls, MD;  Location: [facility];  Service: General Surgery    PR RECONSTR TOTAL SHOULDER IMPLANT Left 04/08/2018    Procedure: ARTHROPLASTY, GLENOHUMERAL JOINT; TOTAL SHOULDER(GLENOID & PROXIMAL HUMERAL REPLACEMENT(EG, TOTAL SHOULDER);  Surgeon: Tomasa Rand, MD;  Location: [facility] OR [facility];  Service: Ortho Sports Medicine    PR REOPEN RECENT ABD EXPLORATORY Midline 03/01/2024    Procedure: REOPENING OF RECENT LAPAROTOMY;  Surgeon: [name]., MD;  Location: OR [facility];  Service: Trauma    PR REOPEN RECENT ABD EXPLORATORY N/A 03/03/2024    Procedure: REOPENING OF RECENT LAPAROTOMY;  Surgeon: Joanie Coddington, MD;  Location: OR [facility];  Service: Trauma    PR SIGMOIDOSCOPY,BIOPSY N/A 03/11/2015    Procedure: SIGMOIDOSCOPY, FLEXIBLE; WITH BIOPSY, SINGLE OR MULTIPLE;  Surgeon: Wilburt Finlay, MD;  [facility];  Service: Gastroenterology    PR XCAPSL CTRC RMVL INSJ IO LENS PROSTH W/O ECP Left 06/05/2022    Procedure: EXTRACAPSULAR CATARACT REMOVAL W/INSERTION OF INTRAOCULAR LENS PROSTHESIS, MANUAL OR MECHANICAL TECHNIQUE WITHOUT ENDOSCOPIC CYCLOPHOTOCOAGULATION;  Surgeon: Garner Gavel, MD;  Location: [facility] OR [facility];  Service: Ophthalmology    PR XCAPSL CTRC RMVL INSJ IO LENS PROSTH W/O ECP Right 06/19/2022    Procedure: EXTRACAPSULAR CATARACT REMOVAL W/INSERTION OF INTRAOCULAR LENS PROSTHESIS, MANUAL OR MECHANICAL TECHNIQUE WITHOUT ENDOSCOPIC CYCLOPHOTOCOAGULATION;  Surgeon: Garner Gavel, MD;  Location: [facility] OR [facility];  Service: Ophthalmology     Scheduled Medications:   acetaminophen  650 mg Enteral tube: gastric Q6H    amiodarone  150 mg Intravenous daily    aspirin  81 mg Enteral tube: gastric Daily    atorvastatin  40 mg Enteral tube: gastric Daily    budesonide (PULMICORT) nebulizer solution  0.5 mg Nebulization BID (RT)    carboxymethylcellulose sodium  2 drop Both Eyes TID    ceftolozane-tazobactam  750 mg Intravenous Q8H    flu vacc [id number](77yr up)-PF  0.5 mL Intramuscular During hospitalization    [START ON 03/14/2024] fluPHENAZine decanoate  50 mg Intramuscular Q30 Days    insulin NPH  5 Units Subcutaneous BID AC    insulin regular  0-20 Units Subcutaneous Q6H SCH    ipratropium  500 mcg Nebulization Q6H (RT)    pantoprazole (Protonix) intravenous solution  40 mg Intravenous Daily    polyethylene glycol  17 g Enteral tube: gastric Daily    sennosides  2.5 mL Enteral tube: gastric Nightly    sodium chloride  10 mL Intravenous Q8H    sodium chloride  10 mL Intravenous Q8H    sodium chloride  10 mL Intravenous Q8H     Continuous  Infusions:   heparin 15 Units/kg/hr (03/12/24 0600)    NORepinephrine bitartrate-NS 7 mcg/min (03/12/24 0600)    NxStage RFP 400 (+/- BB) 5000 mL - contains 2 mEq/L of potassium      NxStage/multiBic RFP 401 (+/- BB) 5000 mL - contains 4 mEq/L of potassium      propofol 10 mg/mL infusion 10 mcg/kg/min (03/12/24 0600)     PRN Medications:  albuterol, [Provider Hold] cyclobenzaprine, dextrose in water, fentaNYL (PF) **OR** fentaNYL (PF), heparin (porcine), heparin (porcine), nicotine polacrilex **OR** nicotine polacrilex, ondansetron, oxyCODONE **OR** oxyCODONE    Tammy Braun is critically ill due to acute kidney injury, altered mental status, atrial fibrillation, bradycardia, coagulopathy, hypotension, pneumonia, pulmonary embolism, and renal insufficiency,    . This critical care time includes examining the patient, evaluating the hemodynamic, laboratory, and radiographic data, independently developing a comprehensive management plan, and serially assessing the patient's response to these critical care interventions. This critical care time excludes procedures.    Critical care time: 90 minutes     Armandina Gemma, ACNP   Surgical Intensive Care Unit  [place] [facility] [place] [facility]

## 2024-03-12 NOTE — Unmapped (Signed)
 Epi gtt initiated this afternoon; titrated to CI above 2.2; currently infusing at 3 mcg/min. Levo able to be weaned off after bolus and stabilization of epi. Remains ventilated. Pt anxious, plan to provide home anxiety medication per formulary. Vigileo remains on. Some output via urostomy. Tube feeds advanced to goal. Family at bedside and updated on plan of care.    Problem: Adult Inpatient Plan of Care  Goal: Plan of Care Review  Outcome: Ongoing - Unchanged  Goal: Patient-Specific Goal (Individualized)  Outcome: Ongoing - Unchanged  Goal: Absence of Hospital-Acquired Illness or Injury  Outcome: Ongoing - Unchanged  Intervention: Identify and Manage Fall Risk  Recent Flowsheet Documentation  Taken 03/12/2024 0800 by Marinus Maw, RN  Safety Interventions:   aspiration precautions   bed alarm   fall reduction program maintained   family at bedside  Intervention: Prevent Skin Injury  Recent Flowsheet Documentation  Taken 03/12/2024 1400 by Marinus Maw, RN  Positioning for Skin: Left  Taken 03/12/2024 1200 by Marinus Maw, RN  Positioning for Skin: Right  Taken 03/12/2024 1000 by Marinus Maw, RN  Positioning for Skin: Left  Taken 03/12/2024 0800 by Marinus Maw, RN  Positioning for Skin: Right  Device Skin Pressure Protection: absorbent pad utilized/changed  Skin Protection: adhesive use limited  Intervention: Prevent and Manage VTE (Venous Thromboembolism) Risk  Recent Flowsheet Documentation  Taken 03/12/2024 1400 by Marinus Maw, RN  Anti-Embolism Device Type: SCD, Knee  Anti-Embolism Device Status: On  Anti-Embolism Device Location: BLE  Taken 03/12/2024 1200 by Marinus Maw, RN  Anti-Embolism Device Type: SCD, Knee  Anti-Embolism Device Status: On  Anti-Embolism Device Location: BLE  Taken 03/12/2024 1000 by Marinus Maw, RN  Anti-Embolism Device Type: SCD, Knee  Anti-Embolism Device Status: On  Anti-Embolism Device Location: BLE  Taken 03/12/2024 0800 by Marinus Maw, RN  Anti-Embolism Device Type: SCD, Knee  Anti-Embolism Device Status: On  Anti-Embolism Device Location: BLE  Intervention: Prevent Infection  Recent Flowsheet Documentation  Taken 03/12/2024 0800 by Marinus Maw, RN  Infection Prevention: cohorting utilized  Goal: Optimal Comfort and Wellbeing  Outcome: Ongoing - Unchanged  Goal: Readiness for Transition of Care  Outcome: Ongoing - Unchanged  Goal: Rounds/Family Conference  Outcome: Ongoing - Unchanged     Problem: Infection  Goal: Absence of Infection Signs and Symptoms  Outcome: Ongoing - Unchanged  Intervention: Prevent or Manage Infection  Recent Flowsheet Documentation  Taken 03/12/2024 0800 by Marinus Maw, RN  Infection Management: aseptic technique maintained  Isolation Precautions: contact precautions maintained     Problem: Fall Injury Risk  Goal: Absence of Fall and Fall-Related Injury  Outcome: Ongoing - Unchanged  Intervention: Promote Injury-Free Environment  Recent Flowsheet Documentation  Taken 03/12/2024 0800 by Marinus Maw, RN  Safety Interventions:   aspiration precautions   bed alarm   fall reduction program maintained   family at bedside     Problem: Wound  Goal: Optimal Coping  Outcome: Ongoing - Unchanged  Goal: Optimal Functional Ability  Outcome: Ongoing - Unchanged  Intervention: Optimize Functional Ability  Recent Flowsheet Documentation  Taken 03/12/2024 0800 by Marinus Maw, RN  Activity Management: bedrest  Goal: Absence of Infection Signs and Symptoms  Outcome: Ongoing - Unchanged  Intervention: Prevent or Manage Infection  Recent Flowsheet Documentation  Taken 03/12/2024 0800 by Marinus Maw, RN  Infection Management: aseptic technique maintained  Isolation Precautions: contact precautions maintained  Goal: Improved Oral Intake  Outcome: Ongoing - Unchanged  Goal: Optimal Pain Control and Function  Outcome: Ongoing -  Unchanged  Goal: Skin Health and Integrity  Outcome: Ongoing - Unchanged  Intervention: Optimize Skin Protection  Recent Flowsheet Documentation  Taken 03/12/2024 0800 by Marinus Maw, RN  Activity Management: bedrest  Pressure Reduction Techniques: weight shift assistance provided  Head of Bed (HOB) Positioning: HOB at 30-45 degrees  Pressure Reduction Devices:   heel offloading device utilized   positioning supports utilized  Skin Protection: adhesive use limited  Goal: Optimal Wound Healing  Outcome: Ongoing - Unchanged     Problem: Skin Injury Risk Increased  Goal: Skin Health and Integrity  Outcome: Ongoing - Unchanged  Intervention: Optimize Skin Protection  Recent Flowsheet Documentation  Taken 03/12/2024 0800 by Marinus Maw, RN  Activity Management: bedrest  Pressure Reduction Techniques: weight shift assistance provided  Head of Bed (HOB) Positioning: HOB at 30-45 degrees  Pressure Reduction Devices:   heel offloading device utilized   positioning supports utilized  Skin Protection: adhesive use limited     Problem: Self-Care Deficit  Goal: Improved Ability to Complete Activities of Daily Living  Outcome: Ongoing - Unchanged     Problem: Artificial Airway  Goal: Effective Communication  Outcome: Ongoing - Unchanged  Goal: Optimal Device Function  Outcome: Ongoing - Unchanged  Intervention: Optimize Device Care and Function  Recent Flowsheet Documentation  Taken 03/12/2024 1200 by Marinus Maw, RN  Oral Care:   mouth swabbed   oral rinse provided   tongue brushed   teeth brushed   suction provided  Taken 03/12/2024 0800 by Marinus Maw, RN  Aspiration Precautions:   NPO pending swallow screening/evaluation   oral hygiene care promoted   respiratory status monitored   tube feeding placement verified   upright posture maintained  Oral Care:   mouth swabbed   oral rinse provided   suction provided   teeth brushed   tongue brushed  Goal: Absence of Device-Related Skin or Tissue Injury  Outcome: Ongoing - Unchanged  Intervention: Maintain Skin and Tissue Health  Recent Flowsheet Documentation  Taken 03/12/2024 0800 by Marinus Maw, RN  Device Skin Pressure Protection: absorbent pad utilized/changed     Problem: Malnutrition  Goal: Improved Nutritional Intake  Outcome: Ongoing - Unchanged     Problem: Mechanical Ventilation Invasive  Goal: Effective Communication  Outcome: Ongoing - Unchanged  Goal: Optimal Device Function  Outcome: Ongoing - Unchanged  Intervention: Optimize Device Care and Function  Recent Flowsheet Documentation  Taken 03/12/2024 1200 by Marinus Maw, RN  Oral Care:   mouth swabbed   oral rinse provided   tongue brushed   teeth brushed   suction provided  Taken 03/12/2024 0800 by Marinus Maw, RN  Oral Care:   mouth swabbed   oral rinse provided   suction provided   teeth brushed   tongue brushed  Goal: Mechanical Ventilation Liberation  Outcome: Ongoing - Unchanged  Goal: Optimal Nutrition Delivery  Outcome: Ongoing - Unchanged  Goal: Absence of Device-Related Skin and Tissue Injury  Outcome: Ongoing - Unchanged  Intervention: Maintain Skin and Tissue Health  Recent Flowsheet Documentation  Taken 03/12/2024 0800 by Marinus Maw, RN  Device Skin Pressure Protection: absorbent pad utilized/changed  Goal: Absence of Ventilator-Induced Lung Injury  Outcome: Ongoing - Unchanged  Intervention: Prevent Ventilator-Associated Pneumonia  Recent Flowsheet Documentation  Taken 03/12/2024 1200 by Marinus Maw, RN  Oral Care:   mouth swabbed   oral rinse provided   tongue brushed   teeth brushed   suction provided  Taken 03/12/2024 0800 by Marinus Maw, RN  Head of  Bed (HOB) Positioning: HOB at 30-45 degrees  Oral Care:   mouth swabbed   oral rinse provided   suction provided   teeth brushed   tongue brushed

## 2024-03-12 NOTE — Unmapped (Signed)
 Pt remains on vent. Settings have decreased today. Ett remains secure with no skin breakdown noted around airway. Will continue to monitor.

## 2024-03-12 NOTE — Unmapped (Signed)
 Division of Infectious Diseases  General Inpatient Consultation Service     For questions about this consult, page [phone] (Gen B Follow-up Pager).      Tammy Braun is being seen in consultation at the request of Aris Georgia, MD for evaluation and management of HAP with multidrug resistant organism, respiratory failure, sepsis.         PLAN FOR 03/12/2024    Diagnostic  Follow up BAL cultures from 3/12  Send blood cultures for T>101 or increasing pressor requirements  Monitor for antimicrobial toxicity with the following:  CBC w/diff at least once per week  BMP at least TWICE per week  clinical assessments for rashes or other skin changes    Treatment  Cont ceftolozane-tazobactam at current dose  Duration of therapy = tbd       I discussed the plans for today with family/caregiver(s) on 03/12/2024.    Our service will continue to follow.    I personally spent 120 mins (2h) face-to-face and non-face-to-face in the care of this patient on 03/12/2024, which includes all pre, intra, and post visit time on the date of service.  All documented time was specific to the E/M visit and does not include any procedures that may have been performed.    Care for a suspected or confirmed infection was provided by an ID specialist in this encounter. [phone]      Isaac Laud, MD  [facility] of Infectious Diseases               MDM and Problem-Specific Assessments  ( .00ID2DAY  /  .56OZHYQMVHQI  /  .IDSS  / Nancee Liter )      83 year old woman with heavy tobacco use, COPD, DM, pulmonary HTN, bladder cancer (s/p radical cystectomy with ileal conduit), CKD who presented 2/26 for projectile vomiting and found to have SBO, possibly from adhesions. She has now had several trips to OR for lysis of adhesions, and this problem has improved. She has had worsening respiratory failure that has required intubation x 2 (failed extubation on 3/10), and worsening leukocytosis 3/4-3/11, likely secondary to a CAP with multidrug resistant pseudomonas atop her chronic COPD. Rapid afib and valvular disease may be contributing to her volume overload and respiratory failure. CT chest confirms VAP.      Patient has: []  acute illness w/systemic sxs  [mod] [x]  illness posing risk to life or function  [high]   I reviewed:   (3+) [x]  primary team note [x]  consultant note(s) [x]  procedure/op note(s) [x]  micro result(s)    [x]  CBC results [x]  chemistry results [x]  radiology report(s) []  w/indep. historian   I independently visualized:   (any)   []  cxs/plates in lab []  plain film images []  CT images []  PET images    []  path slide(s) []  ECG tracing []  MRI images []  nuclear scan   I discussed: (any) [x]  micro and/or path w/lab personnel [x]  drug options and/or interactions w/ID pharmD    []  procedure/OR findings w/other MD(s) []  echo and/or imaging w/other MD(s)    []  mgm't w/attending(s) involved in case []  setting up home abx w/OPAT team   Mgm't requires: []  prescription drug(s)  [mod] [x]  intensive toxicity monitoring  [high]       # VAP with MDR PSA   - acute, poses threat to life or bodily function  [high]     Agree with the antibiotic change that was made yesterday for her carbapenem-R pseudomonas. Maintain ceftolozane-tazobactam  Follow up bronch cultures from yesterday      # SBO with spillage of some bowel contents    - acute, undx'd new problem w/uncertain prognosis  [mod]     CT abdomen done 3/12 and no collections seen.   Current antimicrobials will cover any micro collections      # recurrent pneumonias    - chronic, undx'd new problem w/uncertain prognosis  [mod]     Likely due to her COPD, heavy tobacco use with impaired mucus clearing      # Management of prescription antimicrobials needing intensive toxicity monitoring - acute, poses threat to life or bodily function  [high]  Beta-lactams (penicillins, cephalosporins, and carbapenems) can cause rashes, loose stools, nephrotoxicity, and/or myelosuppression.   See recommendations in blue box above.      # Disposition     Unlikely to need h ome abx             Antimicrobials & Other Medications  ( .00IDGANTT  /  .00IDGANTTLIST  )     Current  Ceftolozane/tazobactam    Previous  Meropenem   Pip-tazo         Current Medications as of 03/12/2024  Scheduled  PRN   acetaminophen, 650 mg, Q6H  [Provider Hold] amiodarone, 150 mg, daily  aspirin, 81 mg, Daily  atorvastatin, 40 mg, Daily  budesonide (PULMICORT) nebulizer solution, 0.5 mg, BID (RT)  carboxymethylcellulose sodium, 2 drop, TID  ceftolozane-tazobactam, 750 mg, Q8H  flu vacc ts2024-25(65yr up)-PF, 0.5 mL, During hospitalization  [START ON 03/14/2024] fluPHENAZine decanoate, 50 mg, Q30 Days  insulin NPH, 5 Units, BID AC  insulin regular, 0-20 Units, Q6H SCH  ipratropium, 500 mcg, Q6H (RT)  pantoprazole (Protonix) intravenous solution, 40 mg, Daily  polyethylene glycol, 17 g, Daily  sennosides, 2.5 mL, Nightly  sodium chloride, 10 mL, Q8H  sodium chloride, 10 mL, Q8H  sodium chloride, 10 mL, Q8H      albuterol, 2.5 mg, Q6H PRN  [Provider Hold] cyclobenzaprine, 5 mg, BID PRN  dextrose in water, 12.5 g, Q10 Min PRN  fentaNYL (PF), 25 mcg, Q30 Min PRN   Or  fentaNYL (PF), 50 mcg, Q30 Min PRN  heparin (porcine), 1,200 Units, Q1H PRN  heparin (porcine), 2,000 Units, Q6H PRN  nicotine polacrilex, 4 mg, Q1H PRN   Or  nicotine polacrilex, 4 mg, Q1H PRN  ondansetron, 4 mg, Q6H PRN  oxyCODONE, 2.5 mg, Q4H PRN   Or  oxyCODONE, 5 mg, Q4H PRN           Physical Exam     Temp:  [36.8 ??C (98.2 ??F)-37.4 ??C (99.3 ??F)] 36.9 ??C (98.4 ??F)  Heart Rate:  [42-127] 113  SpO2 Pulse:  [44-124] 115  Resp:  [10-28] 22  BP: (91-152)/(37-73) 91/45  MAP (mmHg):  [56-96] 58  A BP-1: (61-174)/(39-67) 130/59  MAP:  [58 mmHg-99 mmHg] 78 mmHg  FiO2 (%):  [40 %] 40 %  SpO2:  [89 %-100 %] 100 %    Actual body weight: 68 kg (149 lb 14.6 oz)  Ideal body weight: 47.8 kg (105 lb 4.8 oz)  Adjusted ideal body weight: 55.9 kg (123 lb 2.3 oz)      Const [x]  vital signs above      []  WDWN, NAD, non-toxic appearance  [x]  Frail elderly woman intubated, sedated      Eyes   [x]  Lids normal bilaterally, conjunctiva anicteric and noninjected OU  []  PERRL   []   ENMT     [x]  Normal appearance of external nose and ears       [x]  OP clear    []  MMM, no lesions on lips or gums, dentition good        []  Hearing normal   [x]  Intubated; Hearing impaired      Neck    [x]  Neck of normal appearance and trachea midline        []  No thyromegaly, nodules, or tenderness   [x]  B/l IJ      Lymph    [x]  No LAD in neck       []  No LAD in supraclavicular area       []  No LAD in axillae   []  No LAD in epitrochlear chains       []  No LAD in inguinal areas  []        CV    []  RRR, no m/r/g, S1/S2       []  No peripheral edema, WWP       []  Pedal pulses intact   [x]  Tachycardic irregular s1/s2      Resp   []  Normal WOB       []  CTAB   [x]  Barrel chested. Mechanical bs      GI   []  Normal inspection, NTND, NABS       []  No umbilical hernia on exam       []  No hepatosplenomegaly       []  Inspection of perineal and perianal areas normal  [x]  Soft abdomen. Ileal conduit. Midline incision staples      GU   []  Normal external genitalia       []        MSK   []  No clubbing or cyanosis of hands       []  No focal tenderness or abnormalities on palpation of joints in RUE, LUE, RLE, or LLE  []        Skin   [x]  No rashes, lesions, or ulcers of visualized skin       []  Skin warm and dry to palpation   []        Neuro   []  CNs II-XII grossly intact       []  Sensation to light touch grossly intact throughout   []  DTRs normal and symmetric throughout   [x]  Unable to assess due to critical illness, sedation, or mental status  []        Psych   []  Appropriate affect      []  Oriented to person, place, time      []  Judgment and insight are appropriate   [x]  Unable to assess due to critical illness, sedation, or mental status  []           Patient Lines/Drains/Airways Status       Active Active Lines, Drains, & Airways       Name Placement date Placement time Site Days    Non-Surgical Airway Nasal Cannula 06/19/22  1035  --  632    ETT  7.5 03/11/24  0424  -- 1    CVC Triple Lumen 03/07/24 Non-tunneled Left Internal jugular 03/07/24  0849  Internal jugular  5    Hemodialysis Catheter With Distal Infusion Port 03/06/24 Right Internal jugular 1.2 mL 1.2 mL 03/06/24  2301  Internal jugular  5    Closed/Suction Drain 1 Left Shoulder Accordion 10 Fr. 04/08/18  1430  Shoulder  2165    Closed/Suction Drain 1 LUQ 15 Fr. 03/03/24  0957  LUQ  9    Closed/Suction Drain 2 Lateral LLQ 19 Fr. 03/03/24  1015  LLQ  9    NG/OG Tube 14 Fr. Left nostril 03/03/24  1440  Left nostril  8    Urostomy Ileal conduit RLQ 04/08/02  --  RLQ  8009    Urostomy 04/08/22  --  --  704    Peripheral IV 03/04/24 Anterior;Proximal;Right Forearm 03/04/24  0021  Forearm  8    Arterial Line 03/02/24 Right Radial 03/02/24  2300  Radial  9                      Data for ID Decision Making  ( IDGENCONMDM )       Micro & Serological Data   ( RSLTMICRO  /  [id number]  /  00CXSRC  /  00CXRES  /  00CXSUSC )  Microbiology Results (last day)       Procedure Component Value Date/Time Date/Time    Quantitative Bronchial Culture [[id number]]  (Abnormal) Collected: 03/11/24 0524    Lab Status: Preliminary result Specimen: Lavage, Bronchial from Lung, Left Updated: 03/12/24 1537     Quantitative Bronchial Culture 600,000 CFU/mL Pseudomonas aeruginosa     Comment: Susceptibility performed on Previous Isolate - 03/07/2024        Gram Stain Result 4+ Polymorphonuclear leukocytes      1+ Gram negative rods (bacilli)    Narrative:      Specimen Source: Lung, Left    Quantitative Bronchial Culture [[id number]]  (Abnormal) Collected: 03/11/24 0524    Lab Status: Preliminary result Specimen: Lavage, Bronchial from Lung, Right Updated: 03/12/24 1534     Quantitative Bronchial Culture 100,000 CFU/mL Pseudomonas aeruginosa     Comment: Susceptibility performed on Previous Isolate - 03/07/2024        Gram Stain Result 2+ Polymorphonuclear leukocytes      Probable 1+ Gram negative rods (bacilli)    Narrative:      Specimen Source: Lung, Right    Quantitative Bronchial Culture [[id number]]  (Abnormal)  (Susceptibility) Collected: 03/07/24 1705    Lab Status: Final result Specimen: Lavage, Bronchial from Lung, Right Updated: 03/12/24 0823     Quantitative Bronchial Culture 20,000 CFU/mL Pseudomonas aeruginosa      200,000 CFU/mL Candida glabrata     Comment: Oropharyngeal flora component        Gram Stain Result 2+ Polymorphonuclear leukocytes      2+ Yeast    Narrative:      Specimen Source: Lung, Right    Susceptibility       Pseudomonas aeruginosa (1)       Antibiotic Interpretation Microscan Method Status    Cefepime Resistant  KIRBY BAUER Final    Ceftazidime Resistant  KIRBY BAUER Final    Ciprofloxacin Susceptible  KIRBY BAUER Final    Levofloxacin Susceptible  KIRBY BAUER Final    Meropenem Resistant  KIRBY BAUER Final    Piperacillin + Tazobactam Resistant  KIRBY BAUER Final    Tobramycin Susceptible  KIRBY BAUER Final    Imipenem-relebactam Intermediate  KIRBY BAUER Final    Cefiderocol Susceptible  KIRBY BAUER Final    Ceftazidime-avibactam Resistant 16 MIC SUSCEPTIBILITY RESULT Final    Ceftolozane-tazobactam Susceptible 4 MIC SUSCEPTIBILITY RESULT Final                       Quantitative Bronchial Culture [[id number]]  (Abnormal)  (Susceptibility) Collected: 03/07/24 1705  Lab Status: Final result Specimen: Lavage, Bronchial from Lung, Left Updated: 03/12/24 0820     Quantitative Bronchial Culture 30,000 CFU/mL Pseudomonas aeruginosa      100,000 CFU/mL Candida glabrata     Comment: Oropharyngeal flora component        Gram Stain Result 1+ Polymorphonuclear leukocytes      1+ Yeast    Narrative:      Specimen Source: Lung, Left    Susceptibility       Pseudomonas aeruginosa (1)       Antibiotic Interpretation Microscan Method Status    Cefepime Resistant  KIRBY BAUER Final    Ceftazidime Resistant  KIRBY BAUER Final Ciprofloxacin Susceptible  KIRBY BAUER Final    Levofloxacin Susceptible  KIRBY BAUER Final    Meropenem Susceptible  KIRBY BAUER Final    Piperacillin + Tazobactam Resistant  KIRBY BAUER Final    Tobramycin Susceptible  KIRBY BAUER Final                       Urine Culture [[id number]] Collected: 03/12/24 0344    Lab Status: In process Specimen: Urine from Illeal conduit Updated: 03/12/24 0651    Blood Culture #1 [[id number]]  (Normal) Collected: 03/10/24 0517    Lab Status: Preliminary result Specimen: Blood from 1 Peripheral Draw Updated: 03/12/24 0545     Blood Culture, Routine No Growth at 48 hours    Blood Culture #2 [[id number]]  (Normal) Collected: 03/10/24 0522    Lab Status: Preliminary result Specimen: Blood from 1 Peripheral Draw Updated: 03/12/24 0545     Blood Culture, Routine No Growth at 48 hours             Blood cx  3/11:B Cx x2 NGTD  3/4: Bcx x 2 NGTD    Resp  As above    Recent Studies  ( RISRSLT )    CT Abdomen Pelvis Wo Contrast; Result Date: 03/12/2024  EXAM: CT ABDOMEN PELVIS WO CONTRAST ACCESSION: [id number] UN REPORT DATE: 03/11/2024 6:22 PM CLINICAL INDICATION: 83 years old with septic shock w/up      GI TRACT: Large hiatal hernia. Intrathoracic stomach is mildly distended with oral contrast and right-sided in the lower thorax/upper abdomen. Esophagogastric tube with tip in the stomach. Oral contrast opacifies the stomach and mildly dilated small bowel loops throughout the abdomen measuring up to 3.6 cm (3:26). Oral contrast reaches the rectum. Colonic diverticulosis.   Sequela of partial small bowel resection with anastomosis in the midline pelvis and right lower quadrant. There is redemonstrated transition to normal caliber bowel in the right lower quadrant (2:87).   PERITONEUM/RETROPERITONEUM AND MESENTERY: No free air. Small volume ascites, similar to prior. No organized fluid collection. Surgical drain with percutaneous entry in the left abdomen and  tip in the right pelvis (2:88), unchanged.   -Partial small bowel obstruction. Oral contrast passes the right lower quadrant transition point and reaches the rectum. Similar dilation of the proximal small bowel compared to prior. -Small-volume abdominopelvic ascites, similar to prior. No organized fluid collection.    XR Chest Portable; Result Date: 03/12/2024  Bilateral lower lung airspace consolidation, favor multifocal pneumonia. Moderate right and small left pleural effusions. No pneumothorax.   Large complex hiatal hernia. Cardiac silhouette is normal in size. Thoracic aorta with calcifications.   stable chest       CT Chest Wo Contrast; Result Date: 03/11/2024  EXAM: CT CHEST WO CONTRAST ACCESSION: [id number] UN REPORT DATE: 03/11/2024 5:46 PM   CLINICAL  INDICATION: Assess lungs given high oxygen needs   TECHNIQUE: Contiguous noncontrast axial images were reconstructed through the chest following a single breath hold helical acquisition. Images were reformatted in the axial. coronal, and sagittal planes. MIP slabs were also constructed.   COMPARISON: 3/7/, CT CHEST WO CONTRAST   OTHER: Bilateral IJ central venous catheters terminate near cavoatrial junction. Endotracheal tube terminates at the lower trachea. An enteric tube terminates in the stomach.   Findings are suggestive of multifocal aspiration pneumonia.   Unchanged small bilateral pleural effusions and complete collapse of the bilateral lower lobes.    Echocardiogram Follow Up/Limited Echo; Result Date: 03/11/2024  Patient Info Name:     Rhylan Gross Age:     83 years DOB:     May 18, 1941 Gender:     Female MRN:     [id number] Accession #:     [id number] UN Account #:     [id number] Ht:     155 cm Wt:     68 kg BSA:     1.73 m2 BP:     136 /     48 mmHg Exam Date:     03/11/2024 3:14 PM Admit Date:     02/26/2024   Summary   1. The left ventricular systolic function is hyperdynamic, LVEF is visually estimated at 70%.   2. The left atrium is dilated in size.   3. The right ventricle is moderately dilated in size, with moderately reduced systolic function.   4. There is severe pulmonary hypertension.   5. TR maximum velocity: 4.6 m/s  Estimated PASP: 96 mmHg.   6. The right atrium is dilated in size.     CT  Initial Consult Documentation from March 12, 2024     Sources of information include: chart review, family/friend(s), and treating providers.    HPI    This is an 83 year old woman with heavy tobacco use, COPD, DM, pulmonary HTN, bladder cancer (s/p radical cystectomy with ileal conduit), CKD who presented 2/26 for projectile vomiting. Imaging in ER showed a SBO with transition point at her ileal conduit.  WBC was 18 at time of presentation and she was started on empiric Ceftriaxone. She had increased oxygen needs, and she was transferred to step down surgical service on admission from ER.  Metronidazole was added on 2/27, and she was broadened to pip tazo 2/28. She had Afib with rVR, oxygen requirements increased, and she was intubated at bedside on 2/28.    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 Fistula was taken down with spillage of succus. On 3/2 taken back to OR for abdominal closure. 3/4 back to OR for wound vac placement. WBC started to trend up on 3/4.  She had bronchoscopy on 3/4 that grew 2+ PsA susceptible to meropenem. She was broadened to meropenem on 3/6. She got CRRT from 3/7-3/10. Again bronched on 3/8: two different specimens, one with 20K CFU resistant to meropenem and one wth 30K CFU PsA susceptible to Meropenem. She was extubated on 3/10. On 3/11 started on NE for hypotension. On 3/12 she was re-intubated for respiratory failure. Meropenem broadened to ceftolozane-tazobactam, and ID consulted to assist with antibiotic management    On my evaluation today she is unable to contribute to ROS. Daughter at bedside says she has had recurrent pneumonias that have required admission. Says she is worried because there was construction in their home and there was lots  of dirt and she is worried about mould. No animal or pet exposure.         Past Medical History   Patient  has a past medical history of Anxiety, Arthritis, At risk for falls, Breast cyst, Cancer (CMS-HCC), 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 (07/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      Meds and Allergies  Patient has a current medication list which includes the following prescription(s): albuterol, amlodipine, atorvastatin, glucose blood, blood-glucose meter, candesartan, cyclobenzaprine, diltiazem, fluphenazine decanoate, furosemide, gabapentin, inhalational spacing device, lancets, MEDICAL SUPPLY ITEM, MEDICAL SUPPLY ITEM, tramadol, and trolamine salicylate, and the following Facility-Administered Medications: acetaminophen, albuterol, [Provider Hold] amiodarone in dextrose,iso-osm, aspirin, atorvastatin, budesonide, carboxymethylcellulose sodium, ceftolozane-tazobactam (ZERBAXA) 750 mg in sodium chloride (NS) 0.9 % 100 mL IVPB, [Provider Hold] cyclobenzaprine, dextrose, epinephrine, epinephrine hcl in 5% dextrose, fentanyl (pf) **OR** fentanyl (pf), flu vacc ts2024-25(62yr up)-pf, [START ON 03/14/2024] fluphenazine decanoate, heparin (porcine), heparin (porcine), heparin (porcine), insulin nph, insulin regular, ipratropium, nicotine polacrilex **OR** nicotine polacrilex, norepinephrine bitartrate-d5w, nxstage rfp 400 (+/- bb) 5000 ml - contains 2 meq/l of potassium, nxstage/multibic rfp 401 (+/- bb) 5000 ml - contains 4 meq/l of potassium, ondansetron, oxycodone **OR** oxycodone, pantoprazole, polyethylene glycol, propofol 10 mg/ml infusion, propofol, sennosides, sodium chloride, sodium chloride, sodium chloride, succinylcholine.    Allergies: Lisinopril, Losartan, and Hctz [hydrochlorothiazide]    Social History  Patient  reports that she has been smoking cigarettes. She started smoking about 65 years ago. She has a 65.9 pack-year smoking history. She has never used smokeless tobacco. She reports that she does not drink alcohol and does not use drugs.  Lives with daughter. No recent travel

## 2024-03-12 NOTE — Unmapped (Signed)
 [facility] PROGRESS NOTE     Date of Service: 03/12/2024    Hospital Day: LOS: 14 days        Surgery Date: TBD   Surgical Attending: Aris Georgia, MD    Critical Care Attending: Thalia Party, MD    Interval History:   Up and down on levo requirements, off vasopressin. No additional fluids given. TTE concerning for LVEF 70%, moderate RV dilation and systolic dysfunction, severe pulmonary hypertension. CT chest, abdomen, pelvis obtained to work up additional infectious sources. Antibiotics changed from meropenum to Zerbaxa. ID consult for MDR pseudomonas.     History of Present Illness:   Tammy Braun is a 83 y.o. female with PMH bladder cancer with cystectomy and ileal conduit, COPD, recent dx of afib, moderate-severe TR, CAD (high calcification score), T2DM presented to [facility] with projectile vomiting and abdominal pain, found to have SBO with transition point at level of ileostomy on CT A/P. On initial presentation, labs remarkable for leukocytosis thought to be iso hemoconcentration, AKI on CKD, mildly elevated venous lactate improved with resuscitation. Admitted to [facility] for worsening hypoxia on HFNC requiring intubation in setting of hiatal hernia. OR on 02/28/24 for ex-lap, reduciton of parastomal hernia, small bowel resection with primary anastomosis, lysis of adhesions, abdomen left open with ABThera in place.     Hospital Course:  - 02/26/24: Admitted to [facility], floor status, consult to urology for possible ileal conduit revision.   - 02/27/24: Patient level of care escalated to step-down iso increased oxygen needs while in ED.  - 02/28/24: Upgraded to ICU. Intubated at bed-side. OR for ex-lap, reduction of parastomal hernia, small bowel resection with primary anastomosis, lysis of adhesions, abdomen left open with ABThera in place.   - 03/01/24: OR for attempt at abdominal closure. Left open due to desaturations during closure attempt. ABThera in place.  - 03/03/24: RTOR for abdominal closure  -03/06/24: Vas cath placed, CRRT started   -03/09/24: Extubated to HFNC  -03/10/24: CRRT discontinued   - 03/11/24: Re-intubated for hypoxic respiratory failure 2/2 mucus plug      ASSESSMENT & PLAN:     Neurologic:  - Pain: Oxy PRN, tylenol [facility]    Cardiovascular:  - MAP goal > 65  - NE up to 10  - [place]  - ECHO- LVEF 70%, moderately dilated RV with moderate systolic dysfunction, severe pHTN    #Hx of HTN  -holding home amlodipine    #Hx of HLD  - atorvastatin    #Afib/flutter w RVR:   - Prior hx of paroxysmal afib. CHADSvasc = 6, no AC PTA per chart review.      > Holding home diltiazem, question efficacy given likely poor absorption  - amio 150 mg IV daily   - heparin gtt  - Echo 3/1: LVEF > 55%. Severe TR.  - Pro-BNP: 6,988 on 3/3     #Pulmonary embolism  - 3/5 CTA chest: Filling defect in L anterior segmental artery  - heparin gtt     #Acute thrombus right basilic   - Identified 3/3 on BUE duplex  - Supportive care, superficial, AC not required     Respiratory:  #Acute on chronic hypoxemia  #Hx of COPD  -L Lung white out on CXR, Reintubated, bronched 3/12  -Bronch: Mucous plugging (white thick plugs on L side, collapsible fragile R lobe)  - Aggressive pulmonary toilet: bed precussion, albuterol/3%  - Continue incruse ellipta   Vent Mode: PSV-CPAP  FiO2 (%): 40 %  S  RR: 15  S VT: 300 mL  PEEP: 10 cm H20  PR SUP: 8 cm H20     3/12: CT chest, bilaterally collapsed lower lungs     #Pseudomonas Pneumonia   #VAP  - see ID for abx plan    FEN/GI:  F: ML  E: Replete electrolytes PRN  N: NPO   - TF held due to 2 pressor shock during dayshift - restart now on 1 pressor  - GI prophylaxis: pepcid  - bowel regimen: resume senna and miralax   Last BM: 3/12    #S/p ex-lap, reduction of parastomal hernia, small bowel resection with primary anastomosis, lysis of adhesions  - 3/7 SBO, resolved    #Large hernia, chronic  - Non-operative, chronic  - re-eval on CT chest/abd/pelvis 3/7 stable    Renal/Genitourinary:  #AKI on CKD  #uremia   -Nephrology consulted  - Cr uptrending  - RUS : Bilateral renal cysts measuring up to 2.6 cm in the right upper pole and 1.9 cm left interpolar kidney   - CRRT discontinued 3/10. Leave in [place], will need dialyzed in the future.    > Nephro recommended 4mg  bumex and 200 Diuril. Will hold off on following recs at this time iso requiring 2 pressors.  - No urgent HD needs at this time 3/12 per Nephro, no electrolyte abnormality at this time    #H/o bladder cancer s/p radical cystectomy with ileal conduit (2012):  #Parastomal hernia:  - Strict I&Os  - Foley catheter per stoma, if it falls out, please notify Urology to replace      Endocrine:   #hyperglycemia   - Glycemic Control: SSI, 5 (decreased from 10) units NPH BID    Hematologic:   - Monitor CBC daily, transfuse for hgb 7mg /dL  - DVT ppx: heparin gtt    Immunologic/Infectious Disease:  - Afebrile  - Persistent leukocytosis, continue to monitor     - Cultures:   - Blood cultures (3/4): negative at 5 days   - BAL (3/4): Pseudomonas aeruginosa - susceptible to meropenum    - BAL (3/8): Pseudomonas aeruginosa and candida glabrata  -3/12: Susceptibilities show resistance to Meropenum, Cefepime, Ceftaz and Zosyn.   - Bcx (3/11): NGTD @ 24h  - BAL (3/12): 2-4+ PML  - Ucx (3/13): pending - testing for colonization of candida glabrata    3/12: CT chest, abdomen, pelvis noncon , reads pending    - Antimicrobials:    - Ceftriaxone 2/27- 2/28 - colonized UTI   - Zosyn (3/1 - 3/5) for abdominal contamination  - Zosyn (3/6) for pseudomonas when sensitivities pending  - Meropenum stopped (3/6-3/12) for resistant Pseudomonas PNA   - Zerbaxa started (3/12- ) for 7-10 day course  - ID consulted 3/12, recs pending    Musculoskeletal:   #Deconditioned   - PT/OT consulted     Daily Care Checklist:   - Stress Ulcer Prevention: No  - DVT Prophylaxis: Chemical: Heparin drip  - Daily Awakening: Yes  - Spontaneous Breathing Trial: Yes  - Indication for Central/PICC Line: Yes  Infusions requiring central access, Hemodynamic monitoring, and Inadequate peripheral access, RIJ triple lumen  - Indication for Urinary Catheter: Yes  Strict intake and output, Agressive diuresis/hydration, and Urinary retention/obstruction  - Diagnostic images/reports of past 24hrs reviewed: Yes    Disposition:   - Continue ICU care  - PT/OT consulted:yes     SUBJECTIVE:    Alert and appropriate. Follows commands. Denies pain. Intubated.  OBJECTIVE:     Physical Exam:  Constitutional: Lying supine, NAD  Neurologic: Opens eyes to voice. Moves all 4 extremities to command. Pupils equal and responsive to light.   Respiratory: Mechanically ventilated, diminished bilaterally  Cardiovascular: Bradycardic, regular rhythm s1, s2  Gastrointestinal: Soft abdomen, midline incision staples in tact. Urostomy with Foley in stoma draining clear yellow urine.   Musculoskeletal: Trace BLE/BUE edema.  Skin: Warm and dry     Temp:  [36.9 ??C (98.4 ??F)-37.7 ??C (99.9 ??F)] 37 ??C (98.6 ??F)  Heart Rate:  [42-71] 54  SpO2 Pulse:  [46-72] 55  Resp:  [15-34] 21  BP: (97-152)/(40-106) 110/53  MAP (mmHg):  [59-114] 71  A BP-1: (61-174)/(39-98) 152/50  MAP:  [55 mmHg-101 mmHg] 75 mmHg  FiO2 (%):  [40 %-100 %] 40 %  SpO2:  [75 %-100 %] 100 %  CVP:  [7 mmHg-8 mmHg] 7 mmHg  Recent Laboratory Results:  Recent Labs     Units 03/11/24  1825   PHART  7.40   PCO2ART mm Hg 35.6   PO2ART mm Hg 117.0*   HCO3ART mmol/L 22   BEART  -2.8*   O2SATART % 98.5     Recent Labs     Units 03/10/24  1524 03/11/24  0245 03/11/24  0309 03/11/24  0441 03/11/24  0626 03/11/24  1825   NA mmol/L 138   < > 139   < > 137 137 - 136   K mmol/L 4.5   < > 4.0   < > 4.3 4.5 - 4.1   CL mmol/L 98  --  100  --   --  99   CO2 mmol/L 25.0  --  25.0  --   --  22.0   BUN mg/dL 39*  --  42*  --   --  46*   CREATININE mg/dL 1.61*  --  0.96*  --   --  1.63*   GLU mg/dL 045*  --  409  --   --  170*    < > = values in this interval not displayed.     Lab Results   Component Value Date    BILITOT 0.2 (L) 03/08/2024    BILITOT 0.2 (L) 03/03/2024    BILIDIR 0.10 03/08/2024    BILIDIR 0.10 03/03/2024    ALT <7 (L) 03/08/2024    ALT 9 (L) 03/03/2024    AST 10 03/08/2024    AST 22 03/03/2024    GGT 16 09/17/2011    GGT 354 (H) 05/03/2011    ALKPHOS 53 03/08/2024    ALKPHOS 58 03/03/2024    PROT 5.4 (L) 03/08/2024    PROT 5.5 (L) 03/03/2024    ALBUMIN 2.0 (L) 03/08/2024    ALBUMIN 2.4 (L) 03/03/2024     Recent Labs     Units 03/11/24  0531 03/11/24  0555 03/11/24  1314 03/11/24  1823   POCGLU mg/dL 811 914 782 956     Recent Labs     Units 03/10/24  0355 03/11/24  0309 03/11/24  1825   WBC 10*9/L 22.6* 22.5* 23.8*   RBC 10*12/L 2.93* 2.81* 2.62*   HGB g/dL 8.0* 7.7* 7.1*   HCT % 24.4* 23.6* 22.2*   MCV fL 83.4 84.2 84.9   MCH pg 27.4 27.4 27.2   MCHC g/dL 21.3 08.6 57.8   RDW % 17.9* 17.8* 17.6*   PLT 10*9/L 149* 192 206   MPV fL 9.6 9.8 10.1  Recent Labs     Units 03/11/24  0525 03/11/24  1314   APTT sec 40.5* 48.2*      Lines & Tubes:   Patient Lines/Drains/Airways Status       Active Peripheral & Central Intravenous Access       Name Placement date Placement time Site Days    Peripheral IV 03/04/24 Anterior;Proximal;Right Forearm 03/04/24  0021  Forearm  7    CVC Triple Lumen 03/07/24 Non-tunneled Left Internal jugular 03/07/24  0849  Internal jugular  4    Hemodialysis Catheter With Distal Infusion Port 03/06/24 Right Internal jugular 1.2 mL 1.2 mL 03/06/24  2301  Internal jugular  5                     Patient Lines/Drains/Airways Status       Active Wounds       Name Placement date Placement time Site Days    Surgical Site 04/08/18 Shoulder Left 04/08/18  1338  -- 2164    Surgical Site 06/05/22 Eye Left 06/05/22  0928  -- 645    Surgical Site 06/19/22 Eye Right 06/19/22  1024  -- 631    Surgical Site 03/03/24 Abdomen 03/03/24  1013  -- 8    Wound 06/24/20 Other (comment) Buttocks inner right buttock 06/24/20  1010  Buttocks  1356    Wound 03/11/24 Pressure Injury Sacrum Mid possible stage 2 PI to gluteal cleft Stage 2 03/11/24  1200  Sacrum  less than 1                     Respiratory/ventilator settings for last 24 hours:   Vent Mode: PSV-CPAP  FiO2 (%): 40 %  S RR: 15  S VT: 300 mL  PEEP: 10 cm H20  PR SUP: 8 cm H20    Intake/Output last 3 shifts:  I/O last 3 completed shifts:  In: 2503.9 [I.V.:843.9; NG/GT:1360; IV Piggyback:300]  Out: 1345 [Urine:800; Emesis/NG output:300; Drains:245]    Daily/Recent Weight:  68 kg (149 lb 14.6 oz)    BMI:  Body mass index is 28.34 kg/m??.    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    Chronic kidney disease     Depression, psychotic (CMS-HCC)     Diabetes mellitus (CMS-HCC)     in past    Emphysema of lung (CMS-HCC)     Financial difficulties     Frail elderly     Hearing impairment     Hernia     History of transfusion     Hyperlipidemia     Hypertension     Impaired mobility     Osteoporosis     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/08/2015    Visual impairment      Past Surgical History:   Procedure Laterality Date    ABDOMINAL SURGERY      BLADDER SURGERY      BREAST CYST EXCISION      CHEMOTHERAPY  2012    bladder    GALLBLADDER SURGERY      stone removal    ILEOSTOMY  2012    PR COLONOSCOPY FLX DX W/COLLJ SPEC WHEN PFRMD N/A 02/09/2015    Procedure: COLONOSCOPY, FLEXIBLE,  PROXIMAL TO SPLENIC FLEXURE; DIAGNOSTIC, W/WO COLLECTION SPECIMEN BY BRUSH OR WASH;  Surgeon: Dewaine Conger, MD;  Location: [facility]ED[facility];  Service: Gastroenterology    PR EXPLORATORY OF ABDOMEN N/A 02/28/2024    Procedure: EXPLORATORY LAPAROTOMY, EXPLORATORY CELIOTOMY WITH OR WITHOUT BIOPSY(S);  Surgeon: Renda Rolls, MD;  Location: [facility] OR [facility];  Service: General Surgery    PR RECONSTR TOTAL SHOULDER IMPLANT Left 04/08/2018    Procedure: ARTHROPLASTY, GLENOHUMERAL JOINT; TOTAL SHOULDER(GLENOID & PROXIMAL HUMERAL REPLACEMENT(EG, TOTAL SHOULDER);  Surgeon: Tomasa Rand, MD;  Location: [facility] OR [facility];  Service: Ortho Sports Medicine    PR REOPEN RECENT ABD EXPLORATORY Midline 03/01/2024    Procedure: REOPENING OF RECENT LAPAROTOMY;  Surgeon: [name]., MD;  Location: OR [facility];  Service: Trauma    PR REOPEN RECENT ABD EXPLORATORY N/A 03/03/2024    Procedure: REOPENING OF RECENT LAPAROTOMY;  Surgeon: Joanie Coddington, MD;  Location: OR [facility];  Service: Trauma    PR SIGMOIDOSCOPY,BIOPSY N/A 03/11/2015    Procedure: SIGMOIDOSCOPY, FLEXIBLE; WITH BIOPSY, SINGLE OR MULTIPLE;  Surgeon: Wilburt Finlay, MD;  [facility];  Service: Gastroenterology    PR XCAPSL CTRC RMVL INSJ IO LENS PROSTH W/O ECP Left 06/05/2022    Procedure: EXTRACAPSULAR CATARACT REMOVAL W/INSERTION OF INTRAOCULAR LENS PROSTHESIS, MANUAL OR MECHANICAL TECHNIQUE WITHOUT ENDOSCOPIC CYCLOPHOTOCOAGULATION;  Surgeon: Garner Gavel, MD;  Location: [facility] OR [facility];  Service: Ophthalmology    PR XCAPSL CTRC RMVL INSJ IO LENS PROSTH W/O ECP Right 06/19/2022    Procedure: EXTRACAPSULAR CATARACT REMOVAL W/INSERTION OF INTRAOCULAR LENS PROSTHESIS, MANUAL OR MECHANICAL TECHNIQUE WITHOUT ENDOSCOPIC CYCLOPHOTOCOAGULATION;  Surgeon: Garner Gavel, MD;  Location: [facility] OR [facility];  Service: Ophthalmology     Scheduled Medications:   acetaminophen  650 mg Enteral tube: gastric Q6H    amiodarone  150 mg Intravenous daily    aspirin  81 mg Enteral tube: gastric Daily    atorvastatin  40 mg Enteral tube: gastric Daily    budesonide (PULMICORT) nebulizer solution  0.5 mg Nebulization BID (RT)    carboxymethylcellulose sodium  2 drop Both Eyes TID    ceftolozane-tazobactam  750 mg Intravenous Q8H    flu vacc [id number](61yr up)-PF  0.5 mL Intramuscular During hospitalization    [START ON 03/14/2024] fluPHENAZine decanoate  50 mg Intramuscular Q30 Days    insulin regular  0-20 Units Subcutaneous Q6H SCH ipratropium  500 mcg Nebulization Q6H (RT)    pantoprazole (Protonix) intravenous solution  40 mg Intravenous Daily    [Provider Hold] polyethylene glycol  17 g Enteral tube: gastric Daily    [Provider Hold] sennosides  2.5 mL Enteral tube: gastric Nightly    sodium chloride  10 mL Intravenous Q8H    sodium chloride  10 mL Intravenous Q8H    sodium chloride  10 mL Intravenous Q8H     Continuous Infusions:   heparin 15 Units/kg/hr (03/11/24 1800)    NORepinephrine bitartrate-NS 8 mcg/min (03/11/24 1902)    NxStage RFP 400 (+/- BB) 5000 mL - contains 2 mEq/L of potassium      NxStage/multiBic RFP 401 (+/- BB) 5000 mL - contains 4 mEq/L of potassium      propofol 10 mg/mL infusion 10 mcg/kg/min (03/11/24 1816)    vasopressin       PRN Medications:  albuterol, [Provider Hold] cyclobenzaprine, dextrose in water, fentaNYL (PF) **OR** fentaNYL (PF), heparin (porcine), heparin (porcine), nicotine polacrilex **OR** nicotine polacrilex, ondansetron, oxyCODONE **  OR** oxyCODONE    Tammy Braun is critically ill due to acute kidney injury, altered mental status, atrial fibrillation, bradycardia, coagulopathy, hypotension, pneumonia, pulmonary embolism, and renal insufficiency,    . This critical care time includes examining the patient, evaluating the hemodynamic, laboratory, and radiographic data, independently developing a comprehensive management plan, and serially assessing the patient's response to these critical care interventions. This critical care time excludes procedures.    Critical care time: 60 minutes     Katina Degree, ACNP   Surgical Intensive Care Unit  [facility] [place] [facility]

## 2024-03-12 NOTE — Unmapped (Signed)
 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  - AKI from ATN 2/2 to volume depletion from vomiting + hypotension + CIN  - bCr 0.9-1  - CRRT from 3/7-3/10  - remains oliguric post-CRRT but currently without acute indications for HD  - echocardiogram (3/12) showing severe pHTN & moderately reduced RV function  - recommendations:   - continue supportive measures of MAPs >65, limiting further nephrotoxic medications & contrasted images (unless absolutely needed) & renally dosing medications for eGFR <15   - patient likely to need RRT in the next 24-48 hours given ongoing oliguria + findings concerning for RV dysfunction---will likely need to assist with volume management sooner rather then later. Will discuss with family tomorrow pending UOP + hemodynamics.    # SBO s/p ex-lap   - Evaluation and management per primary team  - No changes to management from a nephrology standpoint at this time    RECOMMENDATIONS:   - no acute indication for RRT  - supportive measures  - We will continue to follow.     Moss Mc, DO  03/12/2024 9:20 PM     Medical decision-making for 03/12/24  Findings / Data     Patient has: []  acute illness w/systemic sxs  [mod]  []  two or more stable chronic illnesses [mod]  []  one chronic illness with acute exacerbation [mod]  []  acute complicated illness  [mod]  []  Undiagnosed new problem with uncertain prognosis  [mod] [x]  illness posing risk to life or bodily function (ex. 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 Probs At least 2:  Probs, Data, Risk   I reviewed: [x]  primary team note  []  consultant note(s)  []  external records [x]  chemistry results  [x]  CBC results  []  blood gas results  []  Other []  procedure/op note(s)   []  radiology report(s)  []  micro result(s)  []  w/ independent historian(s) Labs stable; oliguria >=3 Data Review (2 of 3)    I independently interpreted: []  Urine Sediment  []  Renal US []  CXR Images  []  CT Images  []  Other []  EKG Tracing N/A Any     I discussed: []  Pathology results w/ QHPs(s) from other specialties  []  Procedural findings w/ QHPs(s) from other specialties []  Imaging w/ QHP(s) from other specialties  [x]  Treatment plan w/ QHP(s) from other specialties Plan discussed with primary team Any     Mgm't requires: []  Prescription drug(s)  [mod]  []  Kidney biopsy  [mod]  []  Central line placement  [mod] [x]  High risk medication use and/or intensive toxicity monitoring [high]  [x]  Renal replacement therapy [high]  []  High risk kidney biopsy  [high]  []  Escalation of care  [high]  []  High risk central line placement  [high] RRT: High risk of complications from RRT requiring intensive monitoring and IV Diuresis: check BMP/Mg  Risk      _____________________________________________________________________________________    Subjective/Interval Events:   - still remains intubated but alert follows commands  - diuretics not given yesterday due to pressor support; made >500 mL off diuretics    Physical Exam:  Vitals:    03/12/24 1945 03/12/24 2000 03/12/24 2015 03/12/24 2045   BP:  82/40     Pulse: 107 108 109  105   Resp: 23 22 27 27    Temp:       TempSrc:       SpO2: 100% 100% 100% 100%   Weight:       Height:    154.9 cm (5' 0.98)     No intake/output data recorded.    Intake/Output Summary (Last 24 hours) at 03/12/2024 2120  Last data filed at 03/12/2024 1800  Gross per 24 hour   Intake 2412.86 ml   Output 470 ml   Net 1942.86 ml     Constitutional: ill-appearing; intubated, follows commands  Heart: RRR, no m/r/g  Lungs: intubated; no distress noted  Ext: 2+ edema

## 2024-03-12 NOTE — Unmapped (Signed)
 Patient remains intubated and on the vent.  Family at bedside.  Currently on Propofol 10 an hour.  Awake and trying to communicate with staff and family.  No c/o pain voiced or noted.  Remains on Levo currently at 6 an hour from 8 at 7pm.  Heparin drip continues.  Turned and repositioned per staff.  Complete bath given.  No acute changes noted at this time.     Problem: Adult Inpatient Plan of Care  Goal: Plan of Care Review  Outcome: Progressing     Problem: Adult Inpatient Plan of Care  Goal: Optimal Comfort and Wellbeing  Outcome: Progressing

## 2024-03-13 LAB — MAGNESIUM
MAGNESIUM: 2 mg/dL (ref 1.6–2.6)
MAGNESIUM: 2.1 mg/dL (ref 1.6–2.6)
MAGNESIUM: 2 mg/dL (ref 1.6–2.6)
MAGNESIUM: 2 mg/dL (ref 1.6–2.6)

## 2024-03-13 LAB — PHOSPHORUS
PHOSPHORUS: 4.4 mg/dL (ref 2.4–5.1)
PHOSPHORUS: 4.2 mg/dL (ref 2.4–5.1)
PHOSPHORUS: 4 mg/dL (ref 2.4–5.1)
PHOSPHORUS: 3.9 mg/dL (ref 2.4–5.1)

## 2024-03-13 LAB — BASIC METABOLIC PANEL
ANION GAP: 17 mmol/L — ABNORMAL HIGH (ref 5–14)
ANION GAP: 17 mmol/L — ABNORMAL HIGH (ref 5–14)
ANION GAP: 13 mmol/L (ref 5–14)
ANION GAP: 15 mmol/L — ABNORMAL HIGH (ref 5–14)
BLOOD UREA NITROGEN: 45 mg/dL — ABNORMAL HIGH (ref 9–23)
BLOOD UREA NITROGEN: 42 mg/dL — ABNORMAL HIGH (ref 9–23)
BLOOD UREA NITROGEN: 40 mg/dL — ABNORMAL HIGH (ref 9–23)
BLOOD UREA NITROGEN: 42 mg/dL — ABNORMAL HIGH (ref 9–23)
BUN / CREAT RATIO: 30
BUN / CREAT RATIO: 29
BUN / CREAT RATIO: 30
BUN / CREAT RATIO: 31
CALCIUM: 8.2 mg/dL — ABNORMAL LOW (ref 8.7–10.4)
CALCIUM: 8.4 mg/dL — ABNORMAL LOW (ref 8.7–10.4)
CALCIUM: 8.3 mg/dL — ABNORMAL LOW (ref 8.7–10.4)
CALCIUM: 8.5 mg/dL — ABNORMAL LOW (ref 8.7–10.4)
CHLORIDE: 102 mmol/L (ref 98–107)
CHLORIDE: 102 mmol/L (ref 98–107)
CHLORIDE: 105 mmol/L (ref 98–107)
CHLORIDE: 104 mmol/L (ref 98–107)
CO2: 21 mmol/L (ref 20.0–31.0)
CO2: 21 mmol/L (ref 20.0–31.0)
CO2: 22 mmol/L (ref 20.0–31.0)
CO2: 24 mmol/L (ref 20.0–31.0)
CREATININE: 1.49 mg/dL — ABNORMAL HIGH (ref 0.55–1.02)
CREATININE: 1.46 mg/dL — ABNORMAL HIGH (ref 0.55–1.02)
CREATININE: 1.34 mg/dL — ABNORMAL HIGH (ref 0.55–1.02)
CREATININE: 1.37 mg/dL — ABNORMAL HIGH (ref 0.55–1.02)
EGFR CKD-EPI (2021) FEMALE: 35 mL/min/{1.73_m2} — ABNORMAL LOW (ref >=60)
EGFR CKD-EPI (2021) FEMALE: 36 mL/min/{1.73_m2} — ABNORMAL LOW (ref >=60)
EGFR CKD-EPI (2021) FEMALE: 40 mL/min/{1.73_m2} — ABNORMAL LOW (ref >=60)
EGFR CKD-EPI (2021) FEMALE: 39 mL/min/{1.73_m2} — ABNORMAL LOW (ref >=60)
GLUCOSE RANDOM: 296 mg/dL — ABNORMAL HIGH (ref 70–179)
GLUCOSE RANDOM: 211 mg/dL — ABNORMAL HIGH (ref 70–179)
GLUCOSE RANDOM: 142 mg/dL (ref 70–179)
GLUCOSE RANDOM: 129 mg/dL (ref 70–179)
POTASSIUM: 4.2 mmol/L (ref 3.4–4.8)
POTASSIUM: 4.3 mmol/L (ref 3.4–4.8)
POTASSIUM: 4.3 mmol/L (ref 3.4–4.8)
POTASSIUM: 4.2 mmol/L (ref 3.4–4.8)
SODIUM: 140 mmol/L (ref 135–145)
SODIUM: 140 mmol/L (ref 135–145)
SODIUM: 140 mmol/L (ref 135–145)
SODIUM: 143 mmol/L (ref 135–145)

## 2024-03-13 LAB — CBC
HEMATOCRIT: 22.9 % — ABNORMAL LOW (ref 34.0–44.0)
HEMOGLOBIN: 7.5 g/dL — ABNORMAL LOW (ref 11.3–14.9)
MEAN CORPUSCULAR HEMOGLOBIN CONC: 32.9 g/dL (ref 32.0–36.0)
MEAN CORPUSCULAR HEMOGLOBIN: 27.6 pg (ref 25.9–32.4)
MEAN CORPUSCULAR VOLUME: 83.7 fL (ref 77.6–95.7)
MEAN PLATELET VOLUME: 9.1 fL (ref 6.8–10.7)
PLATELET COUNT: 222 10*9/L (ref 150–450)
RED BLOOD CELL COUNT: 2.73 10*12/L — ABNORMAL LOW (ref 3.95–5.13)
RED CELL DISTRIBUTION WIDTH: 18.1 % — ABNORMAL HIGH (ref 12.2–15.2)
WBC ADJUSTED: 15.3 10*9/L — ABNORMAL HIGH (ref 3.6–11.2)

## 2024-03-13 LAB — APTT
APTT: 42.7 s — ABNORMAL HIGH (ref 24.8–38.4)
APTT: 36.7 s (ref 24.8–38.4)
APTT: 49.2 s — ABNORMAL HIGH (ref 24.8–38.4)
HEPARIN CORRELATION: 0.2
HEPARIN CORRELATION: 0.2
HEPARIN CORRELATION: 0.3

## 2024-03-13 LAB — BLOOD GAS CRITICAL CARE PANEL, ARTERIAL
BASE EXCESS ARTERIAL: -4.9 — ABNORMAL LOW (ref -2.0–2.0)
CALCIUM IONIZED ARTERIAL (MG/DL): 4.13 mg/dL — ABNORMAL LOW (ref 4.40–5.40)
GLUCOSE WHOLE BLOOD: 205 mg/dL — ABNORMAL HIGH (ref 70–179)
HCO3 ARTERIAL: 20 mmol/L — ABNORMAL LOW (ref 22–27)
HEMOGLOBIN BLOOD GAS: 7.7 g/dL — ABNORMAL LOW (ref 12.00–16.00)
LACTATE BLOOD ARTERIAL: 2.1 mmol/L — ABNORMAL HIGH (ref <1.3)
O2 SATURATION ARTERIAL: 97.3 % (ref 94.0–100.0)
PCO2 ARTERIAL: 41 mmHg (ref 35.0–45.0)
PH ARTERIAL: 7.31 — ABNORMAL LOW (ref 7.35–7.45)
PO2 ARTERIAL: 99.3 mmHg (ref 80.0–110.0)
POTASSIUM WHOLE BLOOD: 4.6 mmol/L (ref 3.4–4.6)
SODIUM WHOLE BLOOD: 138 mmol/L (ref 135–145)

## 2024-03-13 MED ADMIN — acetaminophen (TYLENOL) tablet 650 mg: 650 mg | GASTROENTERAL | @ 01:00:00

## 2024-03-13 MED ADMIN — aspirin chewable tablet 81 mg: 81 mg | GASTROENTERAL | @ 13:00:00

## 2024-03-13 MED ADMIN — sodium chloride (NS) 0.9 % flush 10 mL: 10 mL | INTRAVENOUS | @ 01:00:00

## 2024-03-13 MED ADMIN — ipratropium (ATROVENT) 0.02 % nebulizer solution 500 mcg: 500 ug | RESPIRATORY_TRACT | @ 19:00:00

## 2024-03-13 MED ADMIN — budesonide (PULMICORT) nebulizer solution 0.5 mg: .5 mg | RESPIRATORY_TRACT | @ 01:00:00

## 2024-03-13 MED ADMIN — insulin NPH (HumuLIN,NovoLIN) injection 5 Units: 5 [IU] | SUBCUTANEOUS | @ 13:00:00

## 2024-03-13 MED ADMIN — heparin (porcine) 1000 unit/mL injection 2,000 Units: 2000 [IU] | INTRAVENOUS | @ 07:00:00

## 2024-03-13 MED ADMIN — acetaminophen (TYLENOL) tablet 650 mg: 650 mg | GASTROENTERAL | @ 21:00:00 | Stop: 2024-03-13

## 2024-03-13 MED ADMIN — propofol (DIPRIVAN) infusion 10 mg/mL: 0-30 ug/kg/min | INTRAVENOUS | @ 09:00:00

## 2024-03-13 MED ADMIN — carboxymethylcellulose sodium (THERATEARS) 0.25 % ophthalmic solution 2 drop: 2 [drp] | OPHTHALMIC | @ 19:00:00

## 2024-03-13 MED ADMIN — amiodarone in dextrose,iso-osm (NEXTERONE) 150 mg/100 mL (1.5 mg/mL) IVPB 150 mg: 150 mg | INTRAVENOUS | @ 18:00:00

## 2024-03-13 MED ADMIN — sennosides (SENOKOT) oral syrup: 2.5 mL | GASTROENTERAL | @ 01:00:00

## 2024-03-13 MED ADMIN — EPINEPHrine 8 mg in dextrose 5% 250 mL (32 mcg/mL) infusion PMB: 0-2 ug/min | INTRAVENOUS | @ 18:00:00

## 2024-03-13 MED ADMIN — dextrose (D10W) 10% bolus 125 mL: 12.5 g | INTRAVENOUS | @ 16:00:00 | Stop: 2025-02-26

## 2024-03-13 MED ADMIN — ceftolozane-tazobactam (ZERBAXA) 750 mg in sodium chloride (NS) 0.9 % 100 mL IVPB: 750 mg | INTRAVENOUS | @ 21:00:00 | Stop: 2024-03-18

## 2024-03-13 MED ADMIN — heparin 25,000 Units/250 mL (100 units/mL) in 0.45% saline infusion (premade): 0-24 [IU]/kg/h | INTRAVENOUS | @ 22:00:00

## 2024-03-13 MED ADMIN — amiodarone (PACERONE) oral suspension: 200 mg | GASTROENTERAL | @ 01:00:00

## 2024-03-13 MED ADMIN — acetaminophen (TYLENOL) tablet 650 mg: 650 mg | GASTROENTERAL | @ 13:00:00 | Stop: 2024-03-13

## 2024-03-13 MED ADMIN — insulin regular (HumuLIN,NovoLIN) injection 0-20 Units: 0-20 [IU] | SUBCUTANEOUS | @ 11:00:00

## 2024-03-13 MED ADMIN — ipratropium (ATROVENT) 0.02 % nebulizer solution 500 mcg: 500 ug | RESPIRATORY_TRACT | @ 02:00:00

## 2024-03-13 MED ADMIN — pantoprazole (Protonix) injection 40 mg: 40 mg | INTRAVENOUS | @ 13:00:00

## 2024-03-13 MED ADMIN — fluPHENAZine decanoate (PROLIXIN) injection 50 mg: 50 mg | INTRAMUSCULAR | @ 07:00:00 | Stop: 2024-10-09

## 2024-03-13 MED ADMIN — ceftolozane-tazobactam (ZERBAXA) 750 mg in sodium chloride (NS) 0.9 % 100 mL IVPB: 750 mg | INTRAVENOUS | @ 13:00:00 | Stop: 2024-03-18

## 2024-03-13 MED ADMIN — polyethylene glycol (MIRALAX) packet 17 g: 17 g | GASTROENTERAL | @ 13:00:00

## 2024-03-13 MED ADMIN — insulin regular (HumuLIN,NovoLIN) injection 0-20 Units: 0-20 [IU] | SUBCUTANEOUS | @ 05:00:00

## 2024-03-13 MED ADMIN — ceftolozane-tazobactam (ZERBAXA) 750 mg in sodium chloride (NS) 0.9 % 100 mL IVPB: 750 mg | INTRAVENOUS | @ 05:00:00 | Stop: 2024-03-18

## 2024-03-13 MED ADMIN — propofol (DIPRIVAN) infusion 10 mg/mL: 0-30 ug/kg/min | INTRAVENOUS | @ 22:00:00

## 2024-03-13 MED ADMIN — acetaminophen (TYLENOL) tablet 650 mg: 650 mg | GASTROENTERAL | @ 07:00:00 | Stop: 2024-03-13

## 2024-03-13 MED ADMIN — carboxymethylcellulose sodium (THERATEARS) 0.25 % ophthalmic solution 2 drop: 2 [drp] | OPHTHALMIC | @ 01:00:00

## 2024-03-13 MED ADMIN — atorvastatin (LIPITOR) tablet 40 mg: 40 mg | GASTROENTERAL | @ 13:00:00

## 2024-03-13 MED ADMIN — carboxymethylcellulose sodium (THERATEARS) 0.25 % ophthalmic solution 2 drop: 2 [drp] | OPHTHALMIC | @ 13:00:00

## 2024-03-13 MED ADMIN — budesonide (PULMICORT) nebulizer solution 0.5 mg: .5 mg | RESPIRATORY_TRACT | @ 12:00:00

## 2024-03-13 MED ADMIN — ipratropium (ATROVENT) 0.02 % nebulizer solution 500 mcg: 500 ug | RESPIRATORY_TRACT | @ 08:00:00

## 2024-03-13 MED ADMIN — heparin (porcine) 1000 unit/mL injection 2,000 Units: 2000 [IU] | INTRAVENOUS | @ 16:00:00

## 2024-03-13 MED ADMIN — ipratropium (ATROVENT) 0.02 % nebulizer solution 500 mcg: 500 ug | RESPIRATORY_TRACT | @ 12:00:00

## 2024-03-13 NOTE — Unmapped (Signed)
 Pt is ICU status. Continues on the epi for MAP goal over 65. Attempted pause a few times and currently still on 1 mcg/min.   Neuro: Alert and able to follow commands.  Able to move all extremities.  Res: continues on vent with spontaneous mode. Suction as needed. Diminish lung sound.  Cardio: A-fib with HR at around 100. Amio 150 mg IV was given this afternoon without significant rhythm change.    GI: ABD distended with very hypoactive bowel sounds. No BM this shift. NG tube in place. Pt was on TF at goal rate 60 ml/hr at the beginning of the shift. Pt vomited x1 during suction this with greenish secretion. Check the residue with around at around 9 am. Stopped the TF and notified MD. Connect NG to LIWS with 350 ml more output this afternoon. Plan to start the TPN this evening. Pt and family updated.   GU: 500 ml urine output form the urostomy. Urology followed.   Abd surgical site C/D/I. JP drain with small amount serosanguinous drainage. Dressing to the right neck HD catheter and right I J changed. Reposition per protocol.  Pain controlled with current pain management. Continues on the IV abt for infection. Family is at the bedside. Pt safety maintained. Will continue to monitor and reassess pt.    Problem: Adult Inpatient Plan of Care  Goal: Plan of Care Review  Outcome: Progressing  Goal: Patient-Specific Goal (Individualized)  Outcome: Progressing  Goal: Absence of Hospital-Acquired Illness or Injury  Outcome: Progressing  Intervention: Identify and Manage Fall Risk  Recent Flowsheet Documentation  Taken 03/13/2024 1200 by Trixie Dredge, RN  Safety Interventions: assistive device  Taken 03/13/2024 0800 by Trixie Dredge, RN  Safety Interventions: aspiration precautions  Intervention: Prevent Skin Injury  Recent Flowsheet Documentation  Taken 03/13/2024 1400 by Trixie Dredge, RN  Positioning for Skin: Left  Taken 03/13/2024 1200 by Trixie Dredge, RN  Positioning for Skin: (float with pillows on both sides) Other (Comment)  Taken 03/13/2024 1000 by Trixie Dredge, RN  Positioning for Skin: (float with pillows on both side)   Supine/Back   Other (Comment)  Taken 03/13/2024 0800 by Trixie Dredge, RN  Positioning for Skin: Left  Intervention: Prevent Infection  Recent Flowsheet Documentation  Taken 03/13/2024 0800 by Trixie Dredge, RN  Infection Prevention: hand hygiene promoted  Goal: Optimal Comfort and Wellbeing  Outcome: Progressing  Goal: Readiness for Transition of Care  Outcome: Progressing  Goal: Rounds/Family Conference  Outcome: Progressing     Problem: Infection  Goal: Absence of Infection Signs and Symptoms  Outcome: Progressing  Intervention: Prevent or Manage Infection  Recent Flowsheet Documentation  Taken 03/13/2024 0800 by Trixie Dredge, RN  Infection Management: aseptic technique maintained  Isolation Precautions: contact precautions maintained     Problem: Fall Injury Risk  Goal: Absence of Fall and Fall-Related Injury  Outcome: Progressing  Intervention: Promote Injury-Free Environment  Recent Flowsheet Documentation  Taken 03/13/2024 1200 by Trixie Dredge, RN  Safety Interventions: assistive device  Taken 03/13/2024 0800 by Trixie Dredge, RN  Safety Interventions: aspiration precautions     Problem: Wound  Goal: Optimal Coping  Outcome: Progressing  Goal: Optimal Functional Ability  Outcome: Progressing  Intervention: Optimize Functional Ability  Recent Flowsheet Documentation  Taken 03/13/2024 0800 by Trixie Dredge, RN  Activity Management: bedrest  Goal: Absence of Infection Signs and Symptoms  Outcome: Progressing  Intervention: Prevent or Manage Infection  Recent Flowsheet Documentation  Taken 03/13/2024 0800 by Trixie Dredge, RN  Infection Management:  aseptic technique maintained  Isolation Precautions: contact precautions maintained  Goal: Improved Oral Intake  Outcome: Progressing  Goal: Optimal Pain Control and Function  Outcome: Progressing  Goal: Skin Health and Integrity  Outcome: Progressing  Intervention: Optimize Skin Protection  Recent Flowsheet Documentation  Taken 03/13/2024 1400 by Trixie Dredge, RN  Pressure Reduction Techniques: frequent weight shift encouraged  Taken 03/13/2024 0800 by Trixie Dredge, RN  Activity Management: bedrest  Head of Bed (HOB) Positioning: HOB elevated  Goal: Optimal Wound Healing  Outcome: Progressing     Problem: Skin Injury Risk Increased  Goal: Skin Health and Integrity  Outcome: Progressing  Intervention: Optimize Skin Protection  Recent Flowsheet Documentation  Taken 03/13/2024 1400 by Trixie Dredge, RN  Pressure Reduction Techniques: frequent weight shift encouraged  Taken 03/13/2024 0800 by Trixie Dredge, RN  Activity Management: bedrest  Head of Bed (HOB) Positioning: HOB elevated     Problem: Self-Care Deficit  Goal: Improved Ability to Complete Activities of Daily Living  Outcome: Progressing     Problem: Artificial Airway  Goal: Effective Communication  Outcome: Progressing  Goal: Optimal Device Function  Outcome: Progressing  Intervention: Optimize Device Care and Function  Recent Flowsheet Documentation  Taken 03/13/2024 1200 by Trixie Dredge, RN  Oral Care: mouth swabbed  Taken 03/13/2024 0947 by Trixie Dredge, RN  Oral Care:   lip/mouth moisturizer applied   mouth swabbed  Taken 03/13/2024 0800 by Trixie Dredge, RN  Aspiration Precautions:   respiratory status monitored   oral hygiene care promoted   tube feeding placement verified   upright posture maintained  Oral Care:   mouth swabbed   suction provided  Goal: Absence of Device-Related Skin or Tissue Injury  Outcome: Progressing     Problem: Malnutrition  Goal: Improved Nutritional Intake  Outcome: Progressing     Problem: Mechanical Ventilation Invasive  Goal: Effective Communication  Outcome: Progressing  Goal: Optimal Device Function  Outcome: Progressing  Intervention: Optimize Device Care and Function  Recent Flowsheet Documentation  Taken 03/13/2024 1200 by Trixie Dredge, RN  Oral Care: mouth swabbed  Taken 03/13/2024 0947 by Trixie Dredge, RN  Oral Care:   lip/mouth moisturizer applied   mouth swabbed  Taken 03/13/2024 0800 by Trixie Dredge, RN  Oral Care:   mouth swabbed   suction provided  Goal: Mechanical Ventilation Liberation  Outcome: Progressing  Goal: Optimal Nutrition Delivery  Outcome: Progressing  Goal: Absence of Device-Related Skin and Tissue Injury  Outcome: Progressing  Goal: Absence of Ventilator-Induced Lung Injury  Outcome: Progressing  Intervention: Prevent Ventilator-Associated Pneumonia  Recent Flowsheet Documentation  Taken 03/13/2024 1200 by Trixie Dredge, RN  Oral Care: mouth swabbed  Taken 03/13/2024 0947 by Trixie Dredge, RN  Oral Care:   lip/mouth moisturizer applied   mouth swabbed  Taken 03/13/2024 0800 by Trixie Dredge, RN  Head of Bed [facility]) Positioning: HOB elevated  Oral Care:   mouth swabbed   suction provided

## 2024-03-13 NOTE — Unmapped (Signed)
 [facility] PROGRESS NOTE     Date of Service: 03/13/2024    Hospital Day: LOS: 15 days        Surgery Date: TBD   Surgical Attending: Aris Georgia, MD    Critical Care Attending: Thalia Party, MD    Interval History:   Afib overnight (vigileo not accurate), EKG this morning. Foley remains out of stoma. Urology aware. Episode of small emesis this morning followed by residual check resulting in 1L out. NGT to LIWS. TF held. TPN to start tonight. Trach conversation approached by Dr. Coral Else. EPI weaned to maintain MAPs at 65.     History of Present Illness:   Tammy Braun is a 83 y.o. female with PMH bladder cancer with cystectomy and ileal conduit, COPD, recent dx of afib, moderate-severe TR, CAD (high calcification score), T2DM presented to [facility] with projectile vomiting and abdominal pain, found to have SBO with transition point at level of ileostomy on CT A/P. On initial presentation, labs remarkable for leukocytosis thought to be iso hemoconcentration, AKI on CKD, mildly elevated venous lactate improved with resuscitation. Admitted to [facility] for worsening hypoxia on HFNC requiring intubation in setting of hiatal hernia. OR on 02/28/24 for ex-lap, reduciton of parastomal hernia, small bowel resection with primary anastomosis and lysis of adhesions.      Hospital Course:  - 02/26/24: Admitted to [facility], floor status, consult to urology for possible ileal conduit revision.   - 02/27/24: Patient level of care escalated to step-down iso increased oxygen needs while in ED.  - 02/28/24: Upgraded to ICU. Intubated at bed-side. OR for ex-lap, reduction of parastomal hernia, small bowel resection with primary anastomosis, lysis of adhesions, abdomen left open with ABThera in place.   - 03/01/24: OR for attempt at abdominal closure. Left open due to desaturations during closure attempt. ABThera in place.  - 03/03/24: RTOR for abdominal closure  -03/06/24: Vas cath placed, CRRT started   -03/09/24: Extubated to HFNC  -03/10/24: CRRT discontinued   - 03/11/24: Re-intubated for hypoxic respiratory failure 2/2 mucus plug      ASSESSMENT & PLAN:     Neurologic:  - Pain: Oxy PRN, tylenol [facility]    #schizoaffective disorder  - Monthly prolixin injection (given 3/14)    Cardiovascular:  - MAP goal > 65    #HFpEF  #Pulm HTN  - EPI, maintain MAPs 65 or higher  - If clinically worsens (requiring high doses of EPI or adding pressors) consider inhaled nitric oxide.   - Vigileo: inaccurate due HR: afib  - CVP 10 while supine  - ECHO 3/12- LVEF 70%, moderately dilated RV with moderate systolic dysfunction, severe pulmonary hypertension.    #Hx of HTN  -holding home amlodipine    #Hx of HLD  - atorvastatin    #Afib/flutter w RVR:   - Prior hx of paroxysmal afib. CHADSvasc = 6, no AC PTA per chart review.      > Holding home diltiazem, question efficacy given likely poor absorption  - PO Amio ineffective, IV amio bolus x1  - heparin gtt    #Pulmonary embolism  - 3/5 CTA chest: Filling defect in L anterior segmental artery  - heparin gtt     #Acute thrombus right basilic   - Identified 3/3 on BUE duplex  - Supportive care, superficial, AC not required     Respiratory:  #Acute on chronic hypoxemia  #Hx of COPD  -L Lung white out on CXR, Reintubated, bronched 3/12  -Bronch: Mucous plugging (white  thick plugs on L side, collapsible fragile R lobe)  - Aggressive pulmonary toilet: bed precussion, albuterol/3%  - Continue incruse ellipta   Vent Mode: PSV-CPAP  FiO2 (%): 40 %  S RR: 15  PEEP: 10 cm H20  PR SUP: 8 cm H20     3/12: CT chest, bilaterally collapsed lower lungs   -peep 8    #Pseudomonas Pneumonia   #VAP  - see ID for abx plan    FEN/GI:  F: ML  E: Replete electrolytes PRN  N: NPO, NGT to LIWS   - TF held d/t high residuals   - Start TPN  - GI prophylaxis: pepcid  - bowel regimen: resume senna and miralax   Last BM: 3/13    #S/p ex-lap, reduction of parastomal hernia, small bowel resection with primary anastomosis, lysis of adhesions  -3/13 -Partial small bowel obstruction    #Large hernia, chronic  - Non-operative, chronic  - re-eval on CT chest/abd/pelvis 3/13 stable    Renal/Genitourinary:  #AKI on CKD  #uremia   -Nephrology consulted  - Cr stable at 1.48  - CRRT discontinued 3/10. Leave in [place], will need dialyzed in the future.  - No urgent HD needs at this time 3/12 per Nephro, no electrolyte abnormality at this time  -BMP q8    Intake/Output Summary (Last 24 hours) at 03/13/2024 0723  Last data filed at 03/13/2024 0400  Gross per 24 hour   Intake 2937.65 ml   Output 860 ml   Net 2077.65 ml        #H/o bladder cancer s/p radical cystectomy with ileal conduit (2012):  #Parastomal hernia:  - Strict I&Os  - Post Foley RUS 3/14: pending  - Ok for no foley in stoma at this time per urology    Endocrine:   #hyperglycemia   - Glycemic Control: SSI, 5 units NPH BID- holding for PM    Hematologic:   - Monitor CBC daily, transfuse for hgb 7mg /dL  Recent Labs     16/10/96  0338 03/13/24  0135   HGB 6.8* 7.5*     - DVT ppx: heparin gtt    Immunologic/Infectious Disease:  - Afebrile  - Persistent leukocytosis, continue to monitor   Recent Labs     03/12/24  0338 03/13/24  0135   WBC 17.4* 15.3*        - Cultures:   - Blood cultures (3/4): negative at 5 days   - BAL (3/4): Pseudomonas aeruginosa - susceptible to meropenum    - BAL (3/8): Pseudomonas aeruginosa and candida glabrata  -3/12: Susceptibilities show resistance to Meropenum, Cefepime, Ceftaz and Zosyn.   - Bcx (3/11): NGTD @ 24h  - BAL (3/12): 600,000+ psuedomonas (susceptibilities pending)  - Ucx (3/13): no growth for colonization of candida glabrata    - Antimicrobials:    - Ceftriaxone 2/27- 2/28 - colonized UTI   - Zosyn (3/1 - 3/5) for abdominal contamination  - Zosyn (3/6) for pseudomonas when sensitivities pending  - Zerbaxa (3/12- ) for 7-10 day course  - ID onboard 3/12    Musculoskeletal:   #Deconditioned   - PT/OT consulted     Daily Care Checklist:   - Stress Ulcer Prevention: No  - DVT Prophylaxis: Chemical: Heparin drip  - Daily Awakening: Yes  - Spontaneous Breathing Trial: Yes  - Indication for Central/PICC Line: Yes  Infusions requiring central access, Hemodynamic monitoring, and Inadequate peripheral access, RIJ triple lumen  - Indication for Urinary Catheter: Yes  Strict intake and output, Agressive diuresis/hydration, and Urinary retention/obstruction  - Diagnostic images/reports of past 24hrs reviewed: Yes    Disposition:   - Continue ICU care  - PT/OT consulted:yes     SUBJECTIVE:    Alert and appropriate. Follows commands. Denies pain. Intubated.      OBJECTIVE:     Physical Exam:  Constitutional: Lying supine, NAD  Neurologic: Opens eyes to voice. Moves all 4 extremities to command. Pupils equal and responsive to light.   Respiratory: Mechanically ventilated, diminished bilaterally  Cardiovascular: Bradycardic, regular rhythm s1, s2  Gastrointestinal: Soft abdomen, midline incision staples in tact. Urostomy with Foley in stoma draining clear yellow urine.   Musculoskeletal: Trace BLE/BUE edema.  Skin: Warm and dry     Temp:  [36.7 ??C (98 ??F)-37.1 ??C (98.7 ??F)] 36.9 ??C (98.4 ??F)  Heart Rate:  [45-127] 116  SpO2 Pulse:  [45-125] 115  Resp:  [10-33] 24  BP: (82-136)/(37-59) 106/59  MAP (mmHg):  [52-78] 71  A BP-1: (78-155)/(46-88) 89/85  MAP:  [61 mmHg-222 mmHg] 87 mmHg  FiO2 (%):  [40 %] 40 %  SpO2:  [93 %-100 %] 93 %  CVP:  [0 mmHg-15 mmHg] 10 mmHg  Recent Laboratory Results:  Recent Labs     Units 03/12/24  1848   PHART  7.30*   PCO2ART mm Hg 38.3   PO2ART mm Hg 95.7   HCO3ART mmol/L 18*   BEART  -6.8*   O2SATART % 96.8       Recent Labs     Units 03/12/24  0338 03/12/24  1604 03/12/24  1848 03/13/24  0135   NA mmol/L 139 138 135 140   K mmol/L 4.3 4.2 4.4 4.2   CL mmol/L 99 101  --  102   CO2 mmol/L 23.0 21.0  --  21.0   BUN mg/dL 45* 45*  --  45*   CREATININE mg/dL 1.61* 0.96*  --  0.45*   GLU mg/dL 409* 811  --  914*     Lab Results   Component Value Date    BILITOT 0.4 03/12/2024    BILITOT 0.2 (L) 03/08/2024    BILIDIR 0.20 03/12/2024    BILIDIR 0.10 03/08/2024    ALT <7 (L) 03/12/2024    ALT <7 (L) 03/08/2024    AST 12 03/12/2024    AST 10 03/08/2024    GGT 16 09/17/2011    GGT 354 (H) 05/03/2011    ALKPHOS 63 03/12/2024    ALKPHOS 53 03/08/2024    PROT 5.7 03/12/2024    PROT 5.4 (L) 03/08/2024    ALBUMIN 1.9 (L) 03/12/2024    ALBUMIN 2.0 (L) 03/08/2024     Recent Labs     Units 03/12/24  0836 03/12/24  1325 03/12/24  1330   POCGLU mg/dL 782 58* 956     Recent Labs     Units 03/11/24  1825 03/12/24  0338 03/13/24  0135   WBC 10*9/L 23.8* 17.4* 15.3*   RBC 10*12/L 2.62* 2.53* 2.73*   HGB g/dL 7.1* 6.8* 7.5*   HCT % 22.2* 21.4* 22.9*   MCV fL 84.9 84.7 83.7   MCH pg 27.2 26.8 27.6   MCHC g/dL 21.3 08.6* 57.8   RDW % 17.6* 17.9* 18.1*   PLT 10*9/L 206 211 222   MPV fL 10.1 10.0 9.1     Recent Labs     Units 03/12/24  1303 03/12/24  2059 03/13/24  0135   APTT sec  45.7* 41.9* 42.7*      Lines & Tubes:   Patient Lines/Drains/Airways Status       Active Peripheral & Central Intravenous Access       Name Placement date Placement time Site Days    Peripheral IV 03/04/24 Anterior;Proximal;Right Forearm 03/04/24  0021  Forearm  9    CVC Triple Lumen 03/07/24 Non-tunneled Left Internal jugular 03/07/24  0849  Internal jugular  5    Hemodialysis Catheter With Distal Infusion Port 03/06/24 Right Internal jugular 1.2 mL 1.2 mL 03/06/24  2301  Internal jugular  6                     Patient Lines/Drains/Airways Status       Active Wounds       Name Placement date Placement time Site Days    Surgical Site 04/08/18 Shoulder Left 04/08/18  1338  -- 2165    Surgical Site 06/05/22 Eye Left 06/05/22  0928  -- 646    Surgical Site 06/19/22 Eye Right 06/19/22  1024  -- 632    Surgical Site 03/03/24 Abdomen 03/03/24  1013  -- 9    Wound 06/24/20 Other (comment) Buttocks inner right buttock 06/24/20  1010  Buttocks  1357    Wound 03/11/24 Other (comment) Sacrum Mid moisture/friction injury 03/11/24  1200  Sacrum  1    Wound 03/12/24 Other (comment) Groin Right 03/12/24  1500  Groin  less than 1                     Respiratory/ventilator settings for last 24 hours:   Vent Mode: PSV-CPAP  FiO2 (%): 40 %  S RR: 15  PEEP: 10 cm H20  PR SUP: 8 cm H20    Intake/Output last 3 shifts:  I/O last 3 completed shifts:  In: 3522.2 [I.V.:878.7; Blood:25; NG/GT:1040; IV Piggyback:1578.5]  Out: 1125 [Urine:965; Drains:160]    Daily/Recent Weight:  68 kg (149 lb 14.6 oz)    BMI:  Body mass index is 28.34 kg/m??.    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    Chronic kidney disease     Depression, psychotic (CMS-HCC)     Diabetes mellitus (CMS-HCC)     in past    Emphysema of lung (CMS-HCC)     Financial difficulties     Frail elderly     Hearing impairment     Hernia     History of transfusion     Hyperlipidemia     Hypertension     Impaired mobility     Osteoporosis     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/08/2015    Visual impairment      Past Surgical History:   Procedure Laterality Date    ABDOMINAL SURGERY      BLADDER SURGERY      BREAST CYST EXCISION      CHEMOTHERAPY  2012    bladder    GALLBLADDER SURGERY      stone removal    ILEOSTOMY  2012    PR COLONOSCOPY FLX DX W/COLLJ SPEC WHEN PFRMD N/A 02/09/2015    Procedure: COLONOSCOPY, FLEXIBLE, PROXIMAL TO SPLENIC FLEXURE; DIAGNOSTIC, W/WO COLLECTION SPECIMEN BY BRUSH OR WASH;  Surgeon: Dewaine Conger, MD;  Location: [facility];  Service: Gastroenterology    PR EXPLORATORY OF ABDOMEN N/A 02/28/2024    Procedure: EXPLORATORY LAPAROTOMY, EXPLORATORY CELIOTOMY WITH OR WITHOUT BIOPSY(S);  Surgeon: Renda Rolls, MD;  Location: [facility];  Service: General Surgery    PR RECONSTR TOTAL SHOULDER IMPLANT Left 04/08/2018    Procedure: ARTHROPLASTY, GLENOHUMERAL JOINT; TOTAL SHOULDER(GLENOID & PROXIMAL HUMERAL REPLACEMENT(EG, TOTAL SHOULDER);  Surgeon: Tomasa Rand, MD;  [facility];  Service: Ortho Sports Medicine    PR REOPEN RECENT ABD EXPLORATORY Midline 03/01/2024    Procedure: REOPENING OF RECENT LAPAROTOMY;  Surgeon: [name]., MD;  Location: OR [facility];  Service: Trauma    PR REOPEN RECENT ABD EXPLORATORY N/A 03/03/2024    Procedure: REOPENING OF RECENT LAPAROTOMY;  Surgeon: Joanie Coddington, MD;  Location: OR [facility];  Service: Trauma    PR SIGMOIDOSCOPY,BIOPSY N/A 03/11/2015    Procedure: SIGMOIDOSCOPY, FLEXIBLE; WITH BIOPSY, SINGLE OR MULTIPLE;  Surgeon: Wilburt Finlay, MD;  [facility];  Service: Gastroenterology    PR XCAPSL CTRC RMVL INSJ IO LENS PROSTH W/O ECP Left 06/05/2022    Procedure: EXTRACAPSULAR CATARACT REMOVAL W/INSERTION OF INTRAOCULAR LENS PROSTHESIS, MANUAL OR MECHANICAL TECHNIQUE WITHOUT ENDOSCOPIC CYCLOPHOTOCOAGULATION;  Surgeon: Garner Gavel, MD;  Location: [facility] OR [facility];  Service: Ophthalmology    PR XCAPSL CTRC RMVL INSJ IO LENS PROSTH W/O ECP Right 06/19/2022    Procedure: EXTRACAPSULAR CATARACT REMOVAL W/INSERTION OF INTRAOCULAR LENS PROSTHESIS, MANUAL OR MECHANICAL TECHNIQUE WITHOUT ENDOSCOPIC CYCLOPHOTOCOAGULATION;  Surgeon: Garner Gavel, MD;  Location: [facility] OR [facility];  Service: Ophthalmology     Scheduled Medications:   acetaminophen  650 mg Enteral tube: gastric Q6H    amiodarone  200 mg Enteral tube: gastric Daily    aspirin  81 mg Enteral tube: gastric Daily    atorvastatin  40 mg Enteral tube: gastric Daily    budesonide (PULMICORT) nebulizer solution  0.5 mg Nebulization BID (RT)    carboxymethylcellulose sodium  2 drop Both Eyes TID    ceftolozane-tazobactam  750 mg Intravenous Q8H    flu vacc [id number](73yr up)-PF  0.5 mL Intramuscular During hospitalization    fluPHENAZine decanoate  50 mg Intramuscular Q30 Days    insulin NPH  5 Units Subcutaneous BID AC    insulin regular  0-20 Units Subcutaneous Q6H SCH    ipratropium  500 mcg Nebulization Q6H (RT)    pantoprazole (Protonix) intravenous solution  40 mg Intravenous Daily    polyethylene glycol  17 g Enteral tube: gastric Daily    sennosides  2.5 mL Enteral tube: gastric Nightly    sodium chloride  10 mL Intravenous Q8H    sodium chloride  10 mL Intravenous Q8H    sodium chloride  10 mL Intravenous Q8H     Continuous Infusions:   EPINEPHrine 4 mcg/min (03/13/24 0400)    heparin 17 Units/kg/hr (03/13/24 0400)    NxStage RFP 400 (+/- BB) 5000 mL - contains 2 mEq/L of potassium      NxStage/multiBic RFP 401 (+/- BB) 5000 mL - contains 4 mEq/L of potassium      propofol 10 mg/mL infusion 10 mcg/kg/min (03/13/24 0430)     PRN Medications:  albuterol, [Provider Hold] cyclobenzaprine, dextrose in water, fentaNYL (PF) **OR** fentaNYL (PF), heparin (porcine), heparin (porcine), nicotine polacrilex **OR** nicotine polacrilex, ondansetron, oxyCODONE **OR** oxyCODONE    Tresea Heine is critically ill  due to acute kidney injury, altered mental status, atrial fibrillation, bradycardia, coagulopathy, hypotension, pneumonia, pulmonary embolism, and renal insufficiency,    . This critical care time includes examining the patient, evaluating the hemodynamic, laboratory, and radiographic data, independently developing a comprehensive management plan, and serially assessing the patient's response to these critical care interventions. This critical care time excludes procedures.    Critical care time: 60 minutes     Armandina Gemma, ACNP   Surgical Intensive Care Unit  [facility]

## 2024-03-13 NOTE — Unmapped (Signed)
 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 If diuretics are needed for a pulmonary standpoint, okay to give with goal of 1-2 L of UOP    # SBO s/p ex-lap   - Evaluation and management per primary team  - No changes to management from a nephrology standpoint at this time    RECOMMENDATIONS:   - no acute indication for RRT  - supportive measures  - We will continue to follow.     Moss Mc, DO  03/13/2024 7:01 PM     Medical decision-making for 03/13/24  Findings / Data     Patient has: []  acute illness w/systemic sxs  [mod]  []  two or more stable chronic illnesses [mod]  []  one chronic illness with acute exacerbation [mod]  []  acute complicated illness  [mod]  []  Undiagnosed new problem with uncertain prognosis  [mod] [x]  illness posing risk to life or bodily function (ex. 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 Probs At least 2:  Probs, Data, Risk   I reviewed: [x]  primary team note  []  consultant note(s)  []  external records [x]  chemistry results  [x]  CBC results  []  blood gas results  []  Other []  procedure/op note(s)   []  radiology report(s)  []  micro result(s)  []  w/ independent historian(s) Labs stable; UOP slowly improving >=3 Data Review (2 of 3)    I independently interpreted: []  Urine Sediment  []  Renal US []  CXR Images  []  CT Images  []  Other []  EKG Tracing N/A Any     I discussed: []  Pathology results w/ QHPs(s) from other specialties  []  Procedural findings w/ QHPs(s) from other specialties []  Imaging w/ QHP(s) from other specialties  [x]  Treatment plan w/ QHP(s) from other specialties Plan discussed with primary team Any     Mgm't requires: []  Prescription drug(s)  [mod]  []  Kidney biopsy  [mod]  []  Central line placement  [mod] [x]  High risk medication use and/or intensive toxicity monitoring [high]  [x]  Renal replacement therapy [high]  []  High risk kidney biopsy  [high]  []  Escalation of care  [high]  []  High risk central line placement  [high] RRT: High risk of complications from RRT requiring intensive monitoring and IV Diuresis: check BMP/Mg  Risk      _____________________________________________________________________________________    Subjective/Interval Events:   - still remains intubated but alert follows commands  - made >500 mL off diuretics    Physical Exam:  Vitals:    03/13/24 1620 03/13/24 1630 03/13/24 1730 03/13/24 1800   BP:       Pulse: 103 103 107 106   Resp: 25 23 20  19  Temp:       TempSrc:       SpO2:       Weight:       Height:         No intake/output data recorded.    Intake/Output Summary (Last 24 hours) at 03/13/2024 1901  Last data filed at 03/13/2024 1800  Gross per 24 hour   Intake 1680.59 ml   Output 1690 ml   Net -9.41 ml     Constitutional: ill-appearing; intubated, follows commands  Heart: RRR, no m/r/g  Lungs: intubated; no distress noted. Lungs CTA  Ext: 2+ edema

## 2024-03-13 NOTE — Unmapped (Signed)
 Foley came out per primary team. Okay to defer replacement at this time. Recommend obtaining a RUS now and in 48 hours to assess for any interval worsening of hydronephrosis with Foley catheter out.     Donnetta Hutching  Urology Resident, PGY3

## 2024-03-13 NOTE — Unmapped (Signed)
 Pt remains on the ventilator with settings of PSV 8/8 @ 40%. Pt's tube holder was changed this shift without complication. ETT 7.5 @ 20 Lip. Airway remains patent and secure. Emergency equipment is at bedside. There are no concerns at this time. Will continue to monitor.    Problem: Mechanical Ventilation Invasive  Goal: Effective Communication  Outcome: Ongoing - Unchanged  Goal: Optimal Device Function  Outcome: Ongoing - Unchanged  Intervention: Optimize Device Care and Function  Recent Flowsheet Documentation  Taken 03/13/2024 0825 by Daisy Lazar B, RRT  Oral Care:   mouth swabbed   oral rinse provided   suction provided   teeth brushed   tongue brushed  Goal: Mechanical Ventilation Liberation  Outcome: Ongoing - Unchanged  Goal: Optimal Nutrition Delivery  Outcome: Ongoing - Unchanged  Goal: Absence of Device-Related Skin and Tissue Injury  Outcome: Ongoing - Unchanged  Goal: Absence of Ventilator-Induced Lung Injury  Outcome: Ongoing - Unchanged  Intervention: Prevent Ventilator-Associated Pneumonia  Recent Flowsheet Documentation  Taken 03/13/2024 1345 by Daisy Lazar B, RRT  Head of Bed [facility]) Positioning: HOB at 30-45 degrees  Taken 03/13/2024 0825 by Daisy Lazar B, RRT  Head of Bed (HOB) Positioning: HOB at 30-45 degrees  Oral Care:   mouth swabbed   oral rinse provided   suction provided   teeth brushed   tongue brushed     Problem: Artificial Airway  Goal: Effective Communication  Outcome: Ongoing - Unchanged  Goal: Optimal Device Function  Outcome: Ongoing - Unchanged  Intervention: Optimize Device Care and Function  Recent Flowsheet Documentation  Taken 03/13/2024 0825 by Daisy Lazar B, RRT  Oral Care:   mouth swabbed   oral rinse provided   suction provided   teeth brushed   tongue brushed  Goal: Absence of Device-Related Skin or Tissue Injury  Outcome: Ongoing - Unchanged

## 2024-03-13 NOTE — Unmapped (Signed)
 Remains intubated and on vent.  Epi infusing at 4.  Heparin continues and adjusted based on lab results.  Family at bedside.  Updated on plan of care.  Generalized swelling noted.  ROM performed on lower extremities per patient request.  Vigileo remains in use. See flowsheet for detailed assessment data.    Problem: Adult Inpatient Plan of Care  Goal: Plan of Care Review  Outcome: Progressing  Goal: Patient-Specific Goal (Individualized)  Outcome: Progressing  Goal: Optimal Comfort and Wellbeing  Outcome: Progressing

## 2024-03-13 NOTE — Unmapped (Signed)
 Copied from CRM #[id number]. Topic: Access To Clinicians - Order Question  >> Mar 13, 2024  3:27 PM Lequita Halt wrote:  [facility] has questions regarding the following: A call was received due to orders from an incorrect date being sent over. [facility] is requesting orders from date 01/17/2024.    Routine callback turnaround time: 24-48 business hours. Programmer, systems Notified)

## 2024-03-13 NOTE — Unmapped (Signed)
 Patient remained on PSV 8/+10. Peep was not decreased per provider. ETT patent and secure. VSS, NAD noted.    Problem: Artificial Airway  Goal: Effective Communication  Outcome: Progressing     Problem: Mechanical Ventilation Invasive  Goal: Optimal Device Function  Outcome: Ongoing - Unchanged

## 2024-03-13 NOTE — Unmapped (Signed)
 Division of Infectious Diseases  General Inpatient Consultation Service     For questions about this consult, page [phone] (Gen B Follow-up Pager).      Tammy Braun is being seen in consultation at the request of Aris Georgia, MD for evaluation and management of HAP with multidrug resistant organism, respiratory failure, sepsis.         PLAN FOR 03/13/2024    Diagnostic  Monitor for antimicrobial toxicity with the following:  CBC w/diff at least once per week  BMP at least TWICE per week  clinical assessments for rashes or other skin changes    Treatment  Cont ceftolozane-tazobactam at current dose for four more days  Duration of therapy = 7 d   Start date 3/12  End date 3/19    I discussed the plans for today with family/caregiver(s) on 03/13/2024.    Our service will sign off. Call back if new issues or questions    I personally spent 40 min ace-to-face and non-face-to-face in the care of this patient on 03/13/2024, which includes all pre, intra, and post visit time on the date of service.  All documented time was specific to the E/M visit and does not include any procedures that may have been performed.    Care for a suspected or confirmed infection was provided by an ID specialist in this encounter. [phone]      Isaac Laud, MD  [facility] Division of Infectious Diseases               MDM and Problem-Specific Assessments  ( .00ID2DAY  /  .91YNWGNFAOZH  /  .IDSS  / Nancee Liter )      83 year old woman with heavy tobacco use, COPD, DM, pulmonary HTN, bladder cancer (s/p radical cystectomy with ileal conduit), CKD who presented 2/26 for projectile vomiting and found to have SBO, possibly from adhesions. She has now had several trips to OR for lysis of adhesions, and this problem has improved. She has had worsening respiratory failure that has required intubation x 2 (failed extubation on 3/10), and worsening leukocytosis 3/4-3/11, likely secondary to a CAP with multidrug resistant pseudomonas atop her chronic COPD. Rapid afib and valvular disease may be contributing to her volume overload and respiratory failure. CT chest confirms VAP. Pneumonia seems to be improving on ceftolozane-tazobactam      Patient has: []  acute illness w/systemic sxs  [mod] [x]  illness posing risk to life or function  [high]   I reviewed:   (3+) [x]  primary team note [x]  consultant note(s) [x]  procedure/op note(s) [x]  micro result(s)    [x]  CBC results [x]  chemistry results [x]  radiology report(s) []  w/indep. historian   I independently visualized:   (any)   []  cxs/plates in lab []  plain film images []  CT images []  PET images    []  path slide(s) []  ECG tracing []  MRI images []  nuclear scan   I discussed: (any) [x]  micro and/or path w/lab personnel [x]  drug options and/or interactions w/ID pharmD    []  procedure/OR findings w/other MD(s) []  echo and/or imaging w/other MD(s)    []  mgm't w/attending(s) involved in case []  setting up home abx w/OPAT team   Mgm't requires: []  prescription drug(s)  [mod] [x]  intensive toxicity monitoring  [high]       # VAP with MDR PSA   - acute, poses threat to life or bodily function  [high]     Seems to be improving on ceftolozane-tazobactam  Recommend 7  days of therapy      # SBO with spillage of some bowel contents    - acute, undx'd new problem w/uncertain prognosis  [mod]     CT abdomen done 3/12 and no collections seen.   Current antimicrobials will cover any micro collections      # recurrent pneumonias    - chronic, undx'd new problem w/uncertain prognosis  [mod]     Likely due to her COPD, heavy tobacco use with impaired mucus clearing      # Management of prescription antimicrobials needing intensive toxicity monitoring - acute, poses threat to life or bodily function  [high]  Beta-lactams (penicillins, cephalosporins, and carbapenems) can cause rashes, loose stools, nephrotoxicity, and/or myelosuppression.   See recommendations in blue box above.      # Disposition     Unlikely to need h ome abx      Interval Events and Subjective   Interval events  Afebrile on d#3 of ceftolozane-tazobactam  WBC trending down since start of this drug  Pressor dose coming down a bit  Secretions not as copious as today    Subjective  Unable to contribute to ROS             Antimicrobials & Other Medications  ( .00IDGANTT  /  .00IDGANTTLIST  )     Current  Ceftolozane/tazobactam    Previous  Meropenem   Pip-tazo         Current Medications as of 03/13/2024  Scheduled  PRN   acetaminophen, 650 mg, Q6H  amiodarone, 150 mg, daily  aspirin, 81 mg, Daily  atorvastatin, 40 mg, Daily  budesonide (PULMICORT) nebulizer solution, 0.5 mg, BID (RT)  carboxymethylcellulose sodium, 2 drop, TID  ceftolozane-tazobactam, 750 mg, Q8H  flu vacc ts2024-25(79yr up)-PF, 0.5 mL, During hospitalization  fluPHENAZine decanoate, 50 mg, Q30 Days  insulin NPH, 5 Units, BID AC  insulin regular, 0-20 Units, Q6H SCH  ipratropium, 500 mcg, Q6H (RT)  pantoprazole (Protonix) intravenous solution, 40 mg, Daily  polyethylene glycol, 17 g, Daily  sennosides, 2.5 mL, Nightly  sodium chloride, 10 mL, Q8H  sodium chloride, 10 mL, Q8H  sodium chloride, 10 mL, Q8H      albuterol, 2.5 mg, Q6H PRN  [Provider Hold] cyclobenzaprine, 5 mg, BID PRN  dextrose in water, 12.5 g, Q10 Min PRN  fentaNYL (PF), 25 mcg, Q30 Min PRN   Or  fentaNYL (PF), 50 mcg, Q30 Min PRN  heparin (porcine), 1,200 Units, Q1H PRN  heparin (porcine), 2,000 Units, Q6H PRN  nicotine polacrilex, 4 mg, Q1H PRN   Or  nicotine polacrilex, 4 mg, Q1H PRN  ondansetron, 4 mg, Q6H PRN  oxyCODONE, 2.5 mg, Q4H PRN   Or  oxyCODONE, 5 mg, Q4H PRN           Physical Exam     Temp:  [36.7 ??C (98 ??F)-37.1 ??C (98.7 ??F)] 36.7 ??C (98.1 ??F)  Heart Rate:  [99-127] 109  SpO2 Pulse:  [97-125] 115  Resp:  [17-33] 24  BP: (82-113)/(40-61) 107/50  MAP (mmHg):  [52-74] 68  A BP-1: (78-149)/(46-99) 111/56  MAP:  [62 mmHg-222 mmHg] 75 mmHg  FiO2 (%):  [40 %] 40 %  SpO2:  [93 %-100 %] 93 %    Actual body weight: 68 kg (149 lb 14.6 oz)  Ideal body weight: 47.8 kg (105 lb 4.8 oz)  Adjusted ideal body weight: 55.9 kg (123 lb 2.3 oz)      Const [x]  vital signs above      []   WDWN, NAD, non-toxic appearance  [x]  Frail elderly woman intubated, sedated      Eyes   [x]  Lids normal bilaterally, conjunctiva anicteric and noninjected OU  []  PERRL   []        ENMT     [x]  Normal appearance of external nose and ears       [x]  OP clear    []  MMM, no lesions on lips or gums, dentition good        []  Hearing normal   [x]  Intubated; Hearing impaired      Neck    [x]  Neck of normal appearance and trachea midline        []  No thyromegaly, nodules, or tenderness   [x]  B/l IJ      Lymph    [x]  No LAD in neck       []  No LAD in supraclavicular area       []  No LAD in axillae   []  No LAD in epitrochlear chains       []  No LAD in inguinal areas  []        CV    []  RRR, no m/r/g, S1/S2       []  No peripheral edema, WWP       []  Pedal pulses intact   [x]  Tachycardic irregular s1/s2      Resp   []  Normal WOB       []  CTAB   [x]  Barrel chested. Mechanical bs      GI   []  Normal inspection, NTND, NABS       []  No umbilical hernia on exam       []  No hepatosplenomegaly       []  Inspection of perineal and perianal areas normal  [x]  Soft abdomen. Ileal conduit. Midline incision staples      GU   []  Normal external genitalia       []        MSK   []  No clubbing or cyanosis of hands       []  No focal tenderness or abnormalities on palpation of joints in RUE, LUE, RLE, or LLE  []        Skin   [x]  No rashes, lesions, or ulcers of visualized skin       []  Skin warm and dry to palpation   []        Neuro   []  CNs II-XII grossly intact       []  Sensation to light touch grossly intact throughout   []  DTRs normal and symmetric throughout   [x]  Unable to assess due to critical illness, sedation, or mental status  []        Psych   []  Appropriate affect      []  Oriented to person, place, time      []  Judgment and insight are appropriate   [x]  Unable to assess due to critical illness, sedation, or mental status  []           Patient Lines/Drains/Airways Status       Active Active Lines, Drains, & Airways       Name Placement date Placement time Site Days    Non-Surgical Airway Nasal Cannula 06/19/22  1035  --  633    ETT  7.5 03/11/24  0424  -- 2    CVC Triple Lumen 03/07/24 Non-tunneled Left Internal jugular 03/07/24  0849  Internal jugular  6    Hemodialysis Catheter With Distal Infusion Port 03/06/24 Right Internal jugular  1.2 mL 1.2 mL 03/06/24  2301  Internal jugular  6    Closed/Suction Drain 1 Left Shoulder Accordion 10 Fr. 04/08/18  1430  Shoulder  2165    Closed/Suction Drain 1 LUQ 15 Fr. 03/03/24  0957  LUQ  10    Closed/Suction Drain 2 Lateral LLQ 19 Fr. 03/03/24  1015  LLQ  10    NG/OG Tube 14 Fr. Left nostril 03/03/24  1440  Left nostril  9    Urostomy Ileal conduit RLQ 04/08/02  --  RLQ  8010    Urostomy 04/08/22  --  --  705    Peripheral IV 03/04/24 Anterior;Proximal;Right Forearm 03/04/24  0021  Forearm  9    Arterial Line 03/02/24 Right Radial 03/02/24  2300  Radial  10                      Data for ID Decision Making  ( IDGENCONMDM )       Micro & Serological Data   ( RSLTMICRO  /  [id number]  /  00CXSRC  /  00CXRES  /  00CXSUSC )  Microbiology Results (last day)       Procedure Component Value Date/Time Date/Time    Quantitative Bronchial Culture [[id number]]  (Abnormal) Collected: 03/11/24 0524    Lab Status: Preliminary result Specimen: Lavage, Bronchial from Lung, Left Updated: 03/12/24 1537     Quantitative Bronchial Culture 600,000 CFU/mL Pseudomonas aeruginosa     Comment: Susceptibility performed on Previous Isolate - 03/07/2024        Gram Stain Result 4+ Polymorphonuclear leukocytes      1+ Gram negative rods (bacilli)    Narrative:      Specimen Source: Lung, Left    Quantitative Bronchial Culture [[id number]]  (Abnormal) Collected: 03/11/24 0524    Lab Status: Preliminary result Specimen: Lavage, Bronchial from Lung, Right Updated: 03/12/24 1534     Quantitative Bronchial Culture 100,000 CFU/mL Pseudomonas aeruginosa     Comment: Susceptibility performed on Previous Isolate - 03/07/2024        Gram Stain Result 2+ Polymorphonuclear leukocytes      Probable 1+ Gram negative rods (bacilli)    Narrative:      Specimen Source: Lung, Right    Quantitative Bronchial Culture [[id number]]  (Abnormal)  (Susceptibility) Collected: 03/07/24 1705    Lab Status: Final result Specimen: Lavage, Bronchial from Lung, Right Updated: 03/12/24 0823     Quantitative Bronchial Culture 20,000 CFU/mL Pseudomonas aeruginosa      200,000 CFU/mL Candida glabrata     Comment: Oropharyngeal flora component        Gram Stain Result 2+ Polymorphonuclear leukocytes      2+ Yeast    Narrative:      Specimen Source: Lung, Right    Susceptibility       Pseudomonas aeruginosa (1)       Antibiotic Interpretation Microscan Method Status    Cefepime Resistant  KIRBY BAUER Final    Ceftazidime Resistant  KIRBY BAUER Final    Ciprofloxacin Susceptible  KIRBY BAUER Final    Levofloxacin Susceptible  KIRBY BAUER Final    Meropenem Resistant  KIRBY BAUER Final    Piperacillin + Tazobactam Resistant  KIRBY BAUER Final    Tobramycin Susceptible  KIRBY BAUER Final    Imipenem-relebactam Intermediate  KIRBY BAUER Final    Cefiderocol Susceptible  KIRBY BAUER Final    Ceftazidime-avibactam Resistant 16 MIC SUSCEPTIBILITY RESULT Final    Ceftolozane-tazobactam Susceptible  4 MIC SUSCEPTIBILITY RESULT Final                       Quantitative Bronchial Culture [[id number]]  (Abnormal)  (Susceptibility) Collected: 03/07/24 1705    Lab Status: Final result Specimen: Lavage, Bronchial from Lung, Left Updated: 03/12/24 0820     Quantitative Bronchial Culture 30,000 CFU/mL Pseudomonas aeruginosa      100,000 CFU/mL Candida glabrata     Comment: Oropharyngeal flora component        Gram Stain Result 1+ Polymorphonuclear leukocytes      1+ Yeast    Narrative:      Specimen Source: Lung, Left    Susceptibility       Pseudomonas aeruginosa (1)       Antibiotic Interpretation Microscan Method Status    Cefepime Resistant  KIRBY BAUER Final    Ceftazidime Resistant  KIRBY BAUER Final    Ciprofloxacin Susceptible  KIRBY BAUER Final    Levofloxacin Susceptible  KIRBY BAUER Final    Meropenem Susceptible  KIRBY BAUER Final    Piperacillin + Tazobactam Resistant  KIRBY BAUER Final    Tobramycin Susceptible  KIRBY BAUER Final                       Urine Culture [[id number]] Collected: 03/12/24 0344    Lab Status: In process Specimen: Urine from Illeal conduit Updated: 03/12/24 0651    Blood Culture #1 [[id number]]  (Normal) Collected: 03/10/24 0517    Lab Status: Preliminary result Specimen: Blood from 1 Peripheral Draw Updated: 03/12/24 0545     Blood Culture, Routine No Growth at 48 hours    Blood Culture #2 [[id number]]  (Normal) Collected: 03/10/24 0522    Lab Status: Preliminary result Specimen: Blood from 1 Peripheral Draw Updated: 03/12/24 0545     Blood Culture, Routine No Growth at 48 hours             Blood cx  3/11:B Cx x2 NGTD  3/4: Bcx x 2 NGTD    Resp  As above    Recent Studies  ( RISRSLT )    CT Abdomen Pelvis Wo Contrast; Result Date: 03/12/2024  EXAM: CT ABDOMEN PELVIS WO CONTRAST ACCESSION: [id number] UN REPORT DATE: 03/11/2024 6:22 PM CLINICAL INDICATION: 83 years old with septic shock w/up      GI TRACT: Large hiatal hernia. Intrathoracic stomach is mildly distended with oral contrast and right-sided in the lower thorax/upper abdomen. Esophagogastric tube with tip in the stomach. Oral contrast opacifies the stomach and mildly dilated small bowel loops throughout the abdomen measuring up to 3.6 cm (3:26). Oral contrast reaches the rectum. Colonic diverticulosis.   Sequela of partial small bowel resection with anastomosis in the midline pelvis and right lower quadrant. There is redemonstrated transition to normal caliber bowel in the right lower quadrant (2:87).   PERITONEUM/RETROPERITONEUM AND MESENTERY: No free air. Small volume ascites, similar to prior. No organized fluid collection. Surgical drain with percutaneous entry in the left abdomen and  tip in the right pelvis (2:88), unchanged.   -Partial small bowel obstruction. Oral contrast passes the right lower quadrant transition point and reaches the rectum. Similar dilation of the proximal small bowel compared to prior. -Small-volume abdominopelvic ascites, similar to prior. No organized fluid collection.    XR Chest Portable; Result Date: 03/12/2024  Bilateral lower lung airspace consolidation, favor multifocal pneumonia. Moderate right and small left pleural effusions. No pneumothorax.   Large complex hiatal  hernia. Cardiac silhouette is normal in size. Thoracic aorta with calcifications.   stable chest       CT Chest Wo Contrast; Result Date: 03/11/2024  EXAM: CT CHEST WO CONTRAST ACCESSION: [id number] UN REPORT DATE: 03/11/2024 5:46 PM   CLINICAL INDICATION: Assess lungs given high oxygen needs   TECHNIQUE: Contiguous noncontrast axial images were reconstructed through the chest following a single breath hold helical acquisition. Images were reformatted in the axial. coronal, and sagittal planes. MIP slabs were also constructed.   COMPARISON: 3/7/, CT CHEST WO CONTRAST   OTHER: Bilateral IJ central venous catheters terminate near cavoatrial junction. Endotracheal tube terminates at the lower trachea. An enteric tube terminates in the stomach.   Findings are suggestive of multifocal aspiration pneumonia.   Unchanged small bilateral pleural effusions and complete collapse of the bilateral lower lobes.    Echocardiogram Follow Up/Limited Echo; Result Date: 03/11/2024  Patient Info Name:     Tammy Braun Age:     83 years DOB:     [birth year] Gender:     Female MRN:     [id number] Accession #:     [id number] UN Account #:     [id number] Ht:     155 cm Wt:     68 kg BSA:     1.73 m2 BP:     136 /     48 mmHg Exam Date:     03/11/2024 3:14 PM Admit Date:     02/26/2024   Summary 1. The left ventricular systolic function is hyperdynamic, LVEF is visually estimated at 70%.   2. The left atrium is dilated in size.   3. The right ventricle is moderately dilated in size, with moderately reduced systolic function.   4. There is severe pulmonary hypertension.   5. TR maximum velocity: 4.6 m/s  Estimated PASP: 96 mmHg.   6. The right atrium is dilated in size.     CT  Initial Consult Documentation from March 12, 2024     Sources of information include: chart review, family/friend(s), and treating providers.    HPI    This is an 83 year old woman with heavy tobacco use, COPD, DM, pulmonary HTN, bladder cancer (s/p radical cystectomy with ileal conduit), CKD who presented 2/26 for projectile vomiting. Imaging in ER showed a SBO with transition point at her ileal conduit.  WBC was 18 at time of presentation and she was started on empiric Ceftriaxone. She had increased oxygen needs, and she was transferred to step down surgical service on admission from ER.  Metronidazole was added on 2/27, and she was broadened to pip tazo 2/28. She had Afib with rVR, oxygen requirements increased, and she was intubated at bedside on 2/28.    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 Fistula was taken down with spillage of succus. On 3/2 taken back to OR for abdominal closure. 3/4 back to OR for wound vac placement. WBC started to trend up on 3/4.  She had bronchoscopy on 3/4 that grew 2+ PsA susceptible to meropenem. She was broadened to meropenem on 3/6. She got CRRT from 3/7-3/10. Again bronched on 3/8: two different specimens, one with 20K CFU resistant to meropenem and one wth 30K CFU PsA susceptible to Meropenem. She was extubated on 3/10. On 3/11 started on NE for hypotension. On 3/12 she was re-intubated for respiratory failure. Meropenem broadened to ceftolozane-tazobactam, and ID consulted to assist with antibiotic management  On my evaluation today she is unable to contribute to ROS. Daughter at bedside says she has had recurrent pneumonias that have required admission. Says she is worried because there was construction in their home and there was lots of dirt and she is worried about mould. No animal or pet exposure.         Past Medical History   Patient  has a past medical history of Anxiety, Arthritis, At risk for falls, Breast cyst, Cancer (CMS-HCC), 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 (07/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      Meds and Allergies  Patient has a current medication list which includes the following prescription(s): albuterol, amlodipine, atorvastatin, glucose blood, blood-glucose meter, candesartan, cyclobenzaprine, diltiazem, fluphenazine decanoate, furosemide, gabapentin, inhalational spacing device, lancets, MEDICAL SUPPLY ITEM, MEDICAL SUPPLY ITEM, tramadol, and trolamine salicylate, and the following Facility-Administered Medications: acetaminophen, albuterol, amiodarone in dextrose,iso-osm, aspirin, atorvastatin, budesonide, carboxymethylcellulose sodium, ceftolozane-tazobactam (ZERBAXA) 750 mg in sodium chloride (NS) 0.9 % 100 mL IVPB, [Provider Hold] cyclobenzaprine, dextrose, epinephrine, epinephrine hcl in 5% dextrose, fentanyl (pf) **OR** fentanyl (pf), flu vacc ts2024-25(13yr up)-pf, fluphenazine decanoate, heparin (porcine), heparin (porcine), heparin (porcine), insulin nph, insulin regular, ipratropium, nicotine polacrilex **OR** nicotine polacrilex, nxstage rfp 400 (+/- bb) 5000 ml - contains 2 meq/l of potassium, nxstage/multibic rfp 401 (+/- bb) 5000 ml - contains 4 meq/l of potassium, ondansetron, oxycodone **OR** oxycodone, pantoprazole, polyethylene glycol, propofol 10 mg/ml infusion, propofol, sennosides, sodium chloride, sodium chloride, sodium chloride, succinylcholine.    Allergies: Lisinopril, Losartan, and Hctz [hydrochlorothiazide]    Social History  Patient  reports that she has been smoking cigarettes. She started smoking about 65 years ago. She has a 65.9 pack-year smoking history. She has never used smokeless tobacco. She reports that she does not drink alcohol and does not use drugs.  Lives with daughter. No recent travel

## 2024-03-13 NOTE — Unmapped (Signed)
 Reviewed chart and signed

## 2024-03-13 NOTE — Unmapped (Signed)
 Faxed back orders to [employer]

## 2024-03-14 LAB — BASIC METABOLIC PANEL
ANION GAP: 14 mmol/L (ref 5–14)
ANION GAP: 12 mmol/L (ref 5–14)
ANION GAP: 10 mmol/L (ref 5–14)
BLOOD UREA NITROGEN: 40 mg/dL — ABNORMAL HIGH (ref 9–23)
BLOOD UREA NITROGEN: 40 mg/dL — ABNORMAL HIGH (ref 9–23)
BLOOD UREA NITROGEN: 38 mg/dL — ABNORMAL HIGH (ref 9–23)
BUN / CREAT RATIO: 31
BUN / CREAT RATIO: 31
BUN / CREAT RATIO: 30
CALCIUM: 8.4 mg/dL — ABNORMAL LOW (ref 8.7–10.4)
CALCIUM: 8.3 mg/dL — ABNORMAL LOW (ref 8.7–10.4)
CALCIUM: 8.2 mg/dL — ABNORMAL LOW (ref 8.7–10.4)
CHLORIDE: 104 mmol/L (ref 98–107)
CHLORIDE: 106 mmol/L (ref 98–107)
CHLORIDE: 106 mmol/L (ref 98–107)
CO2: 24 mmol/L (ref 20.0–31.0)
CO2: 24 mmol/L (ref 20.0–31.0)
CO2: 23 mmol/L (ref 20.0–31.0)
CREATININE: 1.31 mg/dL — ABNORMAL HIGH (ref 0.55–1.02)
CREATININE: 1.3 mg/dL — ABNORMAL HIGH (ref 0.55–1.02)
CREATININE: 1.28 mg/dL — ABNORMAL HIGH (ref 0.55–1.02)
EGFR CKD-EPI (2021) FEMALE: 41 mL/min/{1.73_m2} — ABNORMAL LOW (ref >=60)
EGFR CKD-EPI (2021) FEMALE: 41 mL/min/{1.73_m2} — ABNORMAL LOW (ref >=60)
EGFR CKD-EPI (2021) FEMALE: 42 mL/min/{1.73_m2} — ABNORMAL LOW (ref >=60)
GLUCOSE RANDOM: 98 mg/dL (ref 70–99)
GLUCOSE RANDOM: 105 mg/dL — ABNORMAL HIGH (ref 70–99)
GLUCOSE RANDOM: 110 mg/dL — ABNORMAL HIGH (ref 70–99)
POTASSIUM: 4.3 mmol/L (ref 3.4–4.8)
POTASSIUM: 4.4 mmol/L (ref 3.4–4.8)
POTASSIUM: 4.2 mmol/L (ref 3.4–4.8)
SODIUM: 142 mmol/L (ref 135–145)
SODIUM: 142 mmol/L (ref 135–145)
SODIUM: 139 mmol/L (ref 135–145)

## 2024-03-14 LAB — CBC
HEMATOCRIT: 23.5 % — ABNORMAL LOW (ref 34.0–44.0)
HEMOGLOBIN: 7.5 g/dL — ABNORMAL LOW (ref 11.3–14.9)
MEAN CORPUSCULAR HEMOGLOBIN CONC: 31.9 g/dL — ABNORMAL LOW (ref 32.0–36.0)
MEAN CORPUSCULAR HEMOGLOBIN: 26.9 pg (ref 25.9–32.4)
MEAN CORPUSCULAR VOLUME: 84.5 fL (ref 77.6–95.7)
MEAN PLATELET VOLUME: 9.5 fL (ref 6.8–10.7)
PLATELET COUNT: 216 10*9/L (ref 150–450)
RED BLOOD CELL COUNT: 2.78 10*12/L — ABNORMAL LOW (ref 3.95–5.13)
RED CELL DISTRIBUTION WIDTH: 18 % — ABNORMAL HIGH (ref 12.2–15.2)
WBC ADJUSTED: 10.9 10*9/L (ref 3.6–11.2)

## 2024-03-14 LAB — MAGNESIUM
MAGNESIUM: 2 mg/dL (ref 1.6–2.6)
MAGNESIUM: 1.9 mg/dL (ref 1.6–2.6)
MAGNESIUM: 1.9 mg/dL (ref 1.6–2.6)

## 2024-03-14 LAB — BLOOD GAS CRITICAL CARE PANEL, ARTERIAL
BASE EXCESS ARTERIAL: -1.3 (ref -2.0–2.0)
CALCIUM IONIZED ARTERIAL (MG/DL): 4.87 mg/dL (ref 4.40–5.40)
GLUCOSE WHOLE BLOOD: 92 mg/dL (ref 70–179)
HCO3 ARTERIAL: 23 mmol/L (ref 22–27)
HEMOGLOBIN BLOOD GAS: 7.2 g/dL — ABNORMAL LOW (ref 12.00–16.00)
LACTATE BLOOD ARTERIAL: 0.9 mmol/L (ref <1.3)
O2 SATURATION ARTERIAL: 99.8 % (ref 94.0–100.0)
PCO2 ARTERIAL: 40.6 mmHg (ref 35.0–45.0)
PH ARTERIAL: 7.38 (ref 7.35–7.45)
PO2 ARTERIAL: 132 mmHg — ABNORMAL HIGH (ref 80.0–110.0)
POTASSIUM WHOLE BLOOD: 4.5 mmol/L (ref 3.4–4.6)
SODIUM WHOLE BLOOD: 139 mmol/L (ref 135–145)

## 2024-03-14 LAB — PHOSPHORUS
PHOSPHORUS: 3.7 mg/dL (ref 2.4–5.1)
PHOSPHORUS: 3.7 mg/dL (ref 2.4–5.1)
PHOSPHORUS: 3.8 mg/dL (ref 2.4–5.1)

## 2024-03-14 LAB — APTT
APTT: 52.5 s — ABNORMAL HIGH (ref 24.8–38.4)
HEPARIN CORRELATION: 0.3

## 2024-03-14 MED ADMIN — sodium chloride 3 % NEBULIZER solution 4 mL: 4 mL | RESPIRATORY_TRACT | @ 20:00:00

## 2024-03-14 MED ADMIN — atorvastatin (LIPITOR) tablet 40 mg: 40 mg | GASTROENTERAL | @ 12:00:00

## 2024-03-14 MED ADMIN — ceftolozane-tazobactam (ZERBAXA) 750 mg in sodium chloride (NS) 0.9 % 100 mL IVPB: 750 mg | INTRAVENOUS | @ 12:00:00 | Stop: 2024-03-18

## 2024-03-14 MED ADMIN — aspirin chewable tablet 81 mg: 81 mg | GASTROENTERAL | @ 12:00:00

## 2024-03-14 MED ADMIN — carboxymethylcellulose sodium (THERATEARS) 0.25 % ophthalmic solution 2 drop: 2 [drp] | OPHTHALMIC | @ 19:00:00

## 2024-03-14 MED ADMIN — budesonide (PULMICORT) nebulizer solution 0.5 mg: .5 mg | RESPIRATORY_TRACT | @ 13:00:00

## 2024-03-14 MED ADMIN — ipratropium (ATROVENT) 0.02 % nebulizer solution 500 mcg: 500 ug | RESPIRATORY_TRACT | @ 08:00:00

## 2024-03-14 MED ADMIN — acetaminophen (OFIRMEV) 10 mg/mL injection 1,000 mg: 1000 mg | INTRAVENOUS | @ 08:00:00 | Stop: 2024-03-15

## 2024-03-14 MED ADMIN — sodium chloride (NS) 0.9 % flush 10 mL: 10 mL | INTRAVENOUS | @ 10:00:00

## 2024-03-14 MED ADMIN — ipratropium (ATROVENT) 0.02 % nebulizer solution 500 mcg: 500 ug | RESPIRATORY_TRACT | @ 01:00:00

## 2024-03-14 MED ADMIN — sodium chloride (NS) 0.9 % flush 10 mL: 10 mL | INTRAVENOUS | @ 19:00:00

## 2024-03-14 MED ADMIN — sodium chloride (NS) 0.9 % flush 10 mL: 10 mL | INTRAVENOUS | @ 04:00:00

## 2024-03-14 MED ADMIN — carboxymethylcellulose sodium (THERATEARS) 0.25 % ophthalmic solution 2 drop: 2 [drp] | OPHTHALMIC | @ 02:00:00

## 2024-03-14 MED ADMIN — pantoprazole (Protonix) injection 40 mg: 40 mg | INTRAVENOUS | @ 12:00:00

## 2024-03-14 MED ADMIN — acetaminophen (OFIRMEV) 10 mg/mL injection 1,000 mg: 1000 mg | INTRAVENOUS | @ 16:00:00 | Stop: 2024-03-15

## 2024-03-14 MED ADMIN — budesonide (PULMICORT) nebulizer solution 0.5 mg: .5 mg | RESPIRATORY_TRACT | @ 01:00:00

## 2024-03-14 MED ADMIN — EPINEPHrine 8 mg in dextrose 5% 250 mL (32 mcg/mL) infusion PMB: 0-2 ug/min | INTRAVENOUS | @ 05:00:00 | Stop: 2024-03-14

## 2024-03-14 MED ADMIN — propofol (DIPRIVAN) infusion 10 mg/mL: 0-30 ug/kg/min | INTRAVENOUS | @ 05:00:00 | Stop: 2024-03-14

## 2024-03-14 MED ADMIN — polyethylene glycol (MIRALAX) packet 17 g: 17 g | GASTROENTERAL | @ 12:00:00

## 2024-03-14 MED ADMIN — ceftolozane-tazobactam (ZERBAXA) 750 mg in sodium chloride (NS) 0.9 % 100 mL IVPB: 750 mg | INTRAVENOUS | @ 05:00:00 | Stop: 2024-03-18

## 2024-03-14 MED ADMIN — ceftolozane-tazobactam (ZERBAXA) 750 mg in sodium chloride (NS) 0.9 % 100 mL IVPB: 750 mg | INTRAVENOUS | @ 20:00:00 | Stop: 2024-03-18

## 2024-03-14 MED ADMIN — ipratropium (ATROVENT) 0.02 % nebulizer solution 500 mcg: 500 ug | RESPIRATORY_TRACT | @ 13:00:00

## 2024-03-14 MED ADMIN — lactated ringers bolus 500 mL: 500 mL | INTRAVENOUS | @ 13:00:00 | Stop: 2024-03-14

## 2024-03-14 MED ADMIN — heparin 25,000 Units/250 mL (100 units/mL) in 0.45% saline infusion (premade): 0-24 [IU]/kg/h | INTRAVENOUS | @ 20:00:00

## 2024-03-14 MED ADMIN — dextrose (D10W) 10% bolus 125 mL: 12.5 g | INTRAVENOUS | @ 22:00:00 | Stop: 2025-02-26

## 2024-03-14 MED ADMIN — ipratropium (ATROVENT) 0.02 % nebulizer solution 500 mcg: 500 ug | RESPIRATORY_TRACT | @ 21:00:00

## 2024-03-14 MED ADMIN — carboxymethylcellulose sodium (THERATEARS) 0.25 % ophthalmic solution 2 drop: 2 [drp] | OPHTHALMIC | @ 12:00:00

## 2024-03-14 MED ADMIN — amiodarone in dextrose,iso-osm (NEXTERONE) 150 mg/100 mL (1.5 mg/mL) IVPB 150 mg: 150 mg | INTRAVENOUS | @ 12:00:00

## 2024-03-14 MED ADMIN — dextrose 5 % in lactated ringers infusion: 50 mL/h | INTRAVENOUS | @ 23:00:00 | Stop: 2024-03-14

## 2024-03-14 NOTE — Unmapped (Signed)
 OCCUPATIONAL THERAPY  Evaluation (03/14/24 1427)    Patient Name:  Tammy Braun       Medical Record Number: [id number]     Date of Birth: 20-Jun-1941  Sex: Female      Post-Discharge Occupational Therapy Recommendations: 5x weekly, Low intensity     Equipment Recommendation  OT DME Recommendations: Defer to post acute       OT Treatment Diagnosis: Need for assistance with personal care, Generalized muscle weakness, Reduced mobility       Assessment  Problem List: Decreased safety awareness, Impaired judgement, Decreased strength, Decreased range of motion, Decreased activity tolerance, Impaired balance, Decreased endurance, Fall risk, Impaired ADLs, Decreased mobility, Obesity    Assessment: Pt is a 83 yr old female; recently on OT caseload then discharged 2/2 intubation, pt extubated today.  Upon acute OT evaluation patient limited due to above stated deficits impacting independence/safety with ADLs/functional transfers. Pt would benefit from continued acute care OT to upgrade pt functional status and safe participation in ADLs, currently recommending pt for post acute discharge of 5x/weekly low intensity. After review of pt's occupational profile and history, assessment of occupational performance, clinical decision making, and development of POC, pt presents as a high complexity case.      Today's Interventions: Pt and daughter educated on role of OT, POC, abdominal precautions, safety awareness.    Activity Tolerance During Today's Session  Tolerated treatment well    Plan  Planned Frequency of Treatment: Plan of Care Initiated: 03/14/24  1-2x per day Weekly Frequency: 3-4 days per week  Planned Treatment Duration: 03/28/24    Planned Interventions:  Clinical research associate, Education (Patient/Family/Caregiver), Home Exercise Program, Self-Care/Home Training, Therapeutic Exercise, Therapeutic Activity      GOALS:   Patient and Family Goals: to get up to the chair    Short Term:   SHORT GOAL #1: Pt will perform toilet t/f with Min A.   Time Frame : 2 weeks  SHORT GOAL #2: Pt will perform UB grooming in unsupported sitting with Min A.   Time Frame : 2 weeks  SHORT GOAL #3: Pt will complete UBD with min assist.   Time Frame : 2 weeks  SHORT GOAL #4: Pt will complete LBD with max assist.   Time Frame : 2 weeks    Long Term Goal #1: Pt will score 15/24 on AMPAC  Time Frame: 4 weeks    Prognosis:  Fair  Positive Indicators:  PLOF, family support  Barriers to Discharge: Functional strength deficits, Decreased range of motion, Decreased safety awareness, Endurance deficits, Impaired Balance, Gait instability, Cognitive deficits, Inaccessible home environment, Poor insight into deficits    Subjective  Medical Updates Since Last Visit/Relevant PMH Affecting Clinical Decision Making:  Extubated  Prior Functional Status Per prior OT eval: Per daughter: Pt completes ADLs at the following assist: eating=set up, grooming=set up, bathing=min assist (seated, sponge bath, assist with buttocks), UBD=min assist (assist to pull over head), LBD=set up mostly, assist if pants tight, toileting=can manage her ostomy mostly, assist if a big one.  Pt uses cane for steps, RW in house and transport w/c for longer distances.          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    Chronic kidney disease     Depression, psychotic (CMS-HCC)     Diabetes mellitus (CMS-HCC)     in past    Emphysema of lung (CMS-HCC)     Financial difficulties     Frail elderly     Hearing impairment     Hernia     History of transfusion     Hyperlipidemia     Hypertension     Impaired mobility     Osteoporosis     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/08/2015    Visual impairment     Social History     Tobacco Use    Smoking status: Every Day     Current packs/day: 1.00     Average packs/day: 1 pack/day for 65.9 years (65.9 ttl pk-yrs)     Types: Cigarettes     Start date: 04/09/1958    Smokeless tobacco: Never    Tobacco comments:     15cpd   Substance Use Topics    Alcohol use: No     Alcohol/week: 0.0 standard drinks of alcohol      Past Surgical History:   Procedure Laterality Date    ABDOMINAL SURGERY      BLADDER SURGERY      BREAST CYST EXCISION      CHEMOTHERAPY  2012    bladder    GALLBLADDER SURGERY      stone removal    ILEOSTOMY  2012    PR COLONOSCOPY FLX DX W/COLLJ SPEC WHEN PFRMD N/A 02/09/2015    Procedure: COLONOSCOPY, FLEXIBLE, PROXIMAL TO SPLENIC FLEXURE; DIAGNOSTIC, W/WO COLLECTION SPECIMEN BY BRUSH OR WASH;  Surgeon: Dewaine Conger, MD;  Location: [facility] [place];  Service: Gastroenterology    PR EXPLORATORY OF ABDOMEN N/A 02/28/2024    Procedure: EXPLORATORY LAPAROTOMY, EXPLORATORY CELIOTOMY WITH OR WITHOUT BIOPSY(S);  Surgeon: Renda Rolls, MD;  Location: [facility];  Service: General Surgery    PR RECONSTR TOTAL SHOULDER IMPLANT Left 04/08/2018    Procedure: ARTHROPLASTY, GLENOHUMERAL JOINT; TOTAL SHOULDER(GLENOID & PROXIMAL HUMERAL REPLACEMENT(EG, TOTAL SHOULDER);  Surgeon: Tomasa Rand, MD;  [facility];  Service: Ortho Sports Medicine    PR REOPEN RECENT ABD EXPLORATORY Midline 03/01/2024    Procedure: REOPENING OF RECENT LAPAROTOMY;  Surgeon: [name]., MD;  Location: OR [facility];  Service: Trauma    PR REOPEN RECENT ABD EXPLORATORY N/A 03/03/2024    Procedure: REOPENING OF RECENT LAPAROTOMY;  Surgeon: Joanie Coddington, MD;  Location: OR [facility];  Service: Trauma    PR SIGMOIDOSCOPY,BIOPSY N/A 03/11/2015    Procedure: SIGMOIDOSCOPY, FLEXIBLE; WITH BIOPSY, SINGLE OR MULTIPLE;  Surgeon: Wilburt Finlay, MD;  [facility];  Service: Gastroenterology    PR XCAPSL CTRC RMVL INSJ IO LENS PROSTH W/O ECP Left 06/05/2022 Procedure: EXTRACAPSULAR CATARACT REMOVAL W/INSERTION OF INTRAOCULAR LENS PROSTHESIS, MANUAL OR MECHANICAL TECHNIQUE WITHOUT ENDOSCOPIC CYCLOPHOTOCOAGULATION;  Surgeon: Garner Gavel, MD;  Location: [facility] OR [facility];  Service: Ophthalmology    PR XCAPSL CTRC RMVL INSJ IO LENS PROSTH W/O ECP Right 06/19/2022    Procedure: EXTRACAPSULAR CATARACT REMOVAL W/INSERTION OF INTRAOCULAR LENS PROSTHESIS, MANUAL OR MECHANICAL TECHNIQUE WITHOUT ENDOSCOPIC CYCLOPHOTOCOAGULATION;  Surgeon: Garner Gavel, MD;  Location: [facility] OR [facility];  Service: Ophthalmology    Family History   Problem Relation Age of Onset    Breast cancer Daughter  50    Diabetes Mother     Glaucoma Father     Colon cancer Neg Hx     Ovarian cancer Neg Hx     Endometrial cancer Neg Hx     Anesthesia problems Neg Hx     Bleeding Disorder Neg Hx         Lisinopril, Losartan, and Hctz [hydrochlorothiazide]     Objective Findings  Precautions / Restrictions  Falls precautions, Other precautions, Isolation precautions (contact)  abdominal    Weight Bearing  Non-applicable    Required Braces or Orthoses  Non-applicable    Communication Preference  Verbal       Pain  Pt denies pain    Equipment / Environment  Vascular access (PIV, TLC, Port-a-cath, PICC), NGT, Telemetry, Foley, Arterial line, Colostomy, JP drain(s), Supplemental oxygen    Living Situation  Living Environment: House  Lives With: Daughter (56 yo grandson)  Home Living: One level home, Stairs to enter with rails, Tub/shower unit, Shower chair with back, Grab bars in shower, Hand-held shower hose, Handicapped height toilet  Rail placement (outside): Bilateral rails in reach  Number of Stairs to Enter (outside): 4  Caregiver Identified?: Yes  Caregiver Availability: Intermittent  Caregiver Ability: Limited lifting  Equipment available at home: Straight cane, Rolling walker, Lift-recliner (transport chair)     Cognition   Orientation Level:  Oriented x 4   Arousal/Alertness: Appropriate responses to stimuli   Attention Span:  Attends with cues to redirect   Memory:  Unable to assess   Following Commands:  Follows one step commands with repetition   Safety Judgment:  Decreased awareness of need for safety, Impulsivity   Awareness of Errors and Problem Solving:  Assistance required to identify errors made, Assistance required to generate solutions, Assistance required to implement solutions     Vision / Hearing   Vision: Wears glasses all the time, Glasses present     Hearing: No deficit identified       Hand Function:  Right Hand Function: Right hand function impaired  Right Hand Impairment: grip strength poor  Left Hand Function: Left hand function impaired  Left Hand Impairment: grip strength poor  Hand Dominance: Right    Skin Inspection:  Skin Inspection: Intact where visualized    ROM / Strength:  UE ROM/Strength: Left Impaired/Limited, Right Impaired/Limited  RUE Impairment: Reduced strength, Limited AROM  LUE Impairment: Reduced strength, Limited AROM  LE ROM/Strength: Left Impaired/Limited, Right Impaired/Limited  RLE Impairment: Reduced strength, Limited AROM  LLE Impairment: Reduced strength, Limited AROM    Coordination:  Coordination: Not tested    Sensation:  RUE Sensation: RUE impaired  RUE Sensation Impairment: Numbness  LUE Sensation: LUE impaired  LUE Sensation Impairment: Numbness  RLE Sensation: RLE impaired  RLE Sensation Impairment: Numbness  LLE Sensation: LLE impaired  LLE Sensation Impairment: Numbness    Balance:  Static Sitting-Level of Assistance: Stand by Assistance, Minimum assistance (varying assist at trunk while seated EOB)     Functional Mobility  Transfers:  (Sit to stand from seated EOB with bilat HHA and Max A with knee block)  Bed Mobility - Needs Assistance: Verbal assist/cues required, Physical assistance required (Supine to sitting EOB with log roll and Max A x 2persons; Sitting to supine Total A)    ADLs  ADLs: Needs assistance with ADLs, Total Assistance, Standby Assist  ADLs - Needs Assistance: Grooming, Toileting, UB dressing, LB dressing, Bathing  Grooming - Needs Assistance: Performed seated, Min assist  Bathing -  Needs Assistance: Performed at bed level, Max assist  Toileting - Needs Assistance: Total Assist, Performed at bed level  UB Dressing - Needs Assistance: Performed seated, Mod assist  LB Dressing - Needs Assistance: Total Assist, Performed seated    Vitals / Orthostatics  Vitals/Orthostatics: VSS    Patient at end of session: All needs in reach, Friends/Family present, In bed, Lines intact, Notified Nurse     Occupational Therapy Session Duration  OT Individual [mins]: 8  OT Co-Treatment [mins]: 23 (with Kaitlyn H., PT)  Reason for Co-treatment: To safely progress mobility, Poor activity tolerance, Requires heavy assist for safety       AM-PAC-Daily Activity  Lower Body Dressing assistance needs: Unable to do/total assistance - Total Dependent Assist  Bathing assistance needs: A lot - Maximum/Moderate Assistance  Toileting assistance needs: Unable to do/total assistance - Total Dependent Assist  Upper Body Dressing assistance needs: A lot - Maximum/Moderate Assistance  Personal Grooming assistance needs: A Little - Minimal/Contact Guard Assist/Supervision  Eating Meals assistance needs: Unable to do/total assistance - Total Dependent Assist    Daily Activity Score: 10    Score (in points): % of Functional Impairment, Limitation, Restriction  6: 100% impaired, limited, restricted  7-8: At least 80%, but less than 100% impaired, limited restricted  9-13: At least 60%, but less than 80% impaired, limited restricted  14-19: At least 40%, but less than 60% impaired, limited restricted  20-22: At least 20%, but less than 40% impaired, limited restricted  23: At least 1%, but less than 20% impaired, limited restricted  24: 0% impaired, limited restricted      I attest that I have reviewed the above information.  Signed: Lowanda Foster, OT  Filed 03/14/2024

## 2024-03-14 NOTE — Unmapped (Addendum)
 Adult Nutrition Assessment Note    Visit Type: Other (comment) (PA Consult)  Reason for Visit: Parenteral Nutrition    Patient currently followed by nutrition and last seen 3/11. Patient previously on PN however transitioned to tube feeds. Now again with small bowel obstruction and appropriate to restart PN. Communicated with PN dietitian team who has agreed patient appropriate to restart PN.    Jules Husbands, MPH, RDN, LDN  Pager: [phone]   Phone: [phone]

## 2024-03-14 NOTE — Unmapped (Signed)
 Pt extubated this shift to HFNC. Pt remains on HFNC tolerating well. VSS and no distress noted. Txs given as ordered will continue to monitor closely.

## 2024-03-14 NOTE — Unmapped (Addendum)
 [facility]U PROGRESS NOTE     Date of Service: 03/14/2024    Hospital Day: LOS: 16 days        Surgery Date: TBD   Surgical Attending: Aris Georgia, MD    Critical Care Attending: Lia Hopping, MD    Interval History:   Extubated to HFNC, doing well. Hypertonic saline nebs to assist with airway clearance. 500cc LR bolus in AM. Off pressors. Remains in Afib though rate controlled on IV amiodarone. NGT to LIWS for decompression. Start TPN tonight. D5LR @ low rate until TPN start ISO some hypoglycemia. Adequate UOP via urostomy.    History of Present Illness:   Tammy Braun is a 83 y.o. female with PMH bladder cancer with cystectomy and ileal conduit, COPD, recent dx of afib, moderate-severe TR, CAD (high calcification score), T2DM presented to [facility] with projectile vomiting and abdominal pain, found to have SBO with transition point at level of ileostomy on CT A/P. On initial presentation, labs remarkable for leukocytosis thought to be iso hemoconcentration, AKI on CKD, mildly elevated venous lactate improved with resuscitation. Admitted to [facility] for worsening hypoxia on HFNC requiring intubation in setting of hiatal hernia. OR on 02/28/24 for ex-lap, reduciton of parastomal hernia, small bowel resection with primary anastomosis and lysis of adhesions.      Hospital Course:  - 02/26/24: Admitted to [facility], floor status, consult to urology for possible ileal conduit revision.   - 02/27/24: Patient level of care escalated to step-down iso increased oxygen needs while in ED.  - 02/28/24: Upgraded to ICU. Intubated at bed-side. OR for ex-lap, reduction of parastomal hernia, small bowel resection with primary anastomosis, lysis of adhesions, abdomen left open with ABThera in place.   - 03/01/24: OR for attempt at abdominal closure. Left open due to desaturations during closure attempt. ABThera in place.  - 03/03/24: RTOR for abdominal closure  -03/06/24: Vas cath placed, CRRT started   -03/09/24: Extubated to HFNC  -03/10/24: CRRT discontinued   - 03/11/24: Re-intubated for hypoxic respiratory failure 2/2 mucus plug      ASSESSMENT & PLAN:     Neurologic:  - Pain: Oxy PRN, tylenol [facility]    #schizoaffective disorder  - Monthly prolixin injection (given 3/14)    Cardiovascular:  - MAP goal > 65    #HFpEF  #Pulm HTN  - Off pressors  - DC vigileo  - ECHO 3/12- LVEF 70%, moderately dilated RV with moderate systolic dysfunction, severe pulmonary hypertension.    #Hx of HTN  -holding home amlodipine    #Hx of HLD  - atorvastatin    #Afib/flutter w RVR:   - Prior hx of paroxysmal afib. CHADSvasc = 6, no AC PTA per chart review.      > Holding home diltiazem, question efficacy given likely poor absorption  - PO Amio ineffective, continue IV formulation  - heparin gtt    #Pulmonary embolism  - 3/5 CTA chest: Filling defect in L anterior segmental artery  - heparin gtt     #Acute thrombus right basilic   - Identified 3/3 on BUE duplex  - Supportive care, superficial, AC not required     Respiratory:  - Extubated this AM to HFNC    #Acute on chronic hypoxemia  #Hx of COPD  - Aggressive pulmonary toilet  - Continue incruse ellipta     #Pseudomonas Pneumonia   #VAP  - see ID for abx plan    FEN/GI:  F: ML  E: Replete electrolytes PRN  N: NPO, NGT to LIWS   - Start TPN  - GI prophylaxis: pepcid  - Bowel regimen: miralax    #S/p ex-lap, reduction of parastomal hernia, small bowel resection with primary anastomosis, lysis of adhesions  -CT 3/13 demonstrating partial small bowel obstruction    #Large hiatal hernia, chronic  - re-eval on CT chest/abd/pelvis 3/13 stable    Renal/Genitourinary:  #AKI on CKD  #Uremia   -Nephrology consulted  - Cr stable at 1.3  - CRRT discontinued 3/10.    Intake/Output Summary (Last 24 hours) at 03/14/2024 1050  Last data filed at 03/14/2024 0830  Gross per 24 hour   Intake 1153.22 ml   Output 2555 ml   Net -1401.78 ml        #H/o bladder cancer s/p radical cystectomy with ileal conduit (2012):  #Parastomal hernia:  - Strict I&Os  - Ok for no foley in stoma at this time per urology    Endocrine:   #hyperglycemia   - Glycemic Control: SSI  - D5LR started @50cc /hr until TPN starts    Hematologic:   - Hgb stable  - Monitor CBC daily, transfuse for hgb 7mg /dL  - DVT ppx: heparin gtt for PE    Immunologic/Infectious Disease:  - Afebrile with resolving leukocytosis     - Cultures:   - Blood cultures (3/4): negative at 5 days   - BAL (3/4): Pseudomonas aeruginosa - susceptible to meropenum    - BAL (3/8): Pseudomonas aeruginosa and candida glabrata  -3/12: Susceptibilities show resistance to Meropenum, Cefepime, Ceftaz and Zosyn.   - Bcx (3/11): NGTD @ 24h  - BAL (3/12): 600,000+ psuedomonas (susceptibilities pending)  - Ucx (3/13): no growth    - Antimicrobials:    - Ceftriaxone 2/27- 2/28 - colonized UTI   - Zosyn (3/1 - 3/5) for abdominal contamination  - Zosyn (3/6) for pseudomonas when sensitivities pending  - Zerbaxa (3/12- ) for 7-10 day course  - ID following, appreciate recs    Musculoskeletal:   #Deconditioned   - PT/OT consulted     Daily Care Checklist:   - Stress Ulcer Prevention: No  - DVT Prophylaxis: Chemical: Heparin drip  - Daily Awakening: Yes  - Spontaneous Breathing Trial: Yes  - Indication for Central/PICC Line: Yes  Infusions requiring central access, Hemodynamic monitoring, and Inadequate peripheral access, RIJ triple lumen  - Indication for Urinary Catheter: Yes  Strict intake and output, Agressive diuresis/hydration, and Urinary retention/obstruction  - Diagnostic images/reports of past 24hrs reviewed: Yes    Disposition:   - Continue ICU care  - PT/OT consulted:yes     SUBJECTIVE:    Alert and appropriate. Follows commands. Denies pain.     OBJECTIVE:     Physical Exam:  Constitutional: Lying supine, NAD  Neurologic: Opens eyes to voice. Moves all 4 extremities to command. Pupils equal and responsive to light.   Respiratory: Diminished breath sounds bilaterally. Equal chest rise. HFNC  Cardiovascular: Mildly tachycardic with irregularly irregular rhythm  Gastrointestinal: Soft abdomen, midline incision staples in tact. Urostomy draining clear yellow urine.   Musculoskeletal: Trace BLE/BUE edema.  Skin: Warm and dry     Temp:  [36.4 ??C (97.6 ??F)-37 ??C (98.6 ??F)] 36.8 ??C (98.2 ??F)  Heart Rate:  [95-120] 117  SpO2 Pulse:  [95-118] 117  Resp:  [14-26] 19  BP: (81-107)/(42-60) 81/53  MAP (mmHg):  [53-68] 60  A BP-1: (72-133)/(43-96) 100/57  MAP:  [56 mmHg-108 mmHg] 73 mmHg  FiO2 (%):  [40 %]  40 %  SpO2:  [96 %-100 %] 100 %  CVP:  [0 mmHg-23 mmHg] 9 mmHg  Recent Laboratory Results:  Recent Labs     Units 03/14/24  0407   PHART  7.38   PCO2ART mm Hg 40.6   PO2ART mm Hg 132.0*   HCO3ART mmol/L 23   BEART  -1.3   O2SATART % 99.8       Recent Labs     Units 03/13/24  1649 03/13/24  2003 03/14/24  0407   NA mmol/L 140 143 142 - 139   K mmol/L 4.3 4.2 4.3 - 4.5   CL mmol/L 105 104 104   CO2 mmol/L 22.0 24.0 24.0   BUN mg/dL 40* 42* 40*   CREATININE mg/dL 1.61* 0.96* 0.45*   GLU mg/dL 409 811 98     Lab Results   Component Value Date    BILITOT 0.4 03/12/2024    BILITOT 0.2 (L) 03/08/2024    BILIDIR 0.20 03/12/2024    BILIDIR 0.10 03/08/2024    ALT <7 (L) 03/12/2024    ALT <7 (L) 03/08/2024    AST 12 03/12/2024    AST 10 03/08/2024    GGT 16 09/17/2011    GGT 354 (H) 05/03/2011    ALKPHOS 63 03/12/2024    ALKPHOS 53 03/08/2024    PROT 5.7 03/12/2024    PROT 5.4 (L) 03/08/2024    ALBUMIN 1.9 (L) 03/12/2024    ALBUMIN 2.0 (L) 03/08/2024     Recent Labs     Units 03/13/24  1826 03/14/24  0051 03/14/24  0600 03/14/24  0813   POCGLU mg/dL 914 782 956 213     Recent Labs     Units 03/12/24  0338 03/13/24  0135 03/14/24  0407   WBC 10*9/L 17.4* 15.3* 10.9   RBC 10*12/L 2.53* 2.73* 2.78*   HGB g/dL 6.8* 7.5* 7.5*   HCT % 21.4* 22.9* 23.5*   MCV fL 84.7 83.7 84.5   MCH pg 26.8 27.6 26.9   MCHC g/dL 08.6* 57.8 46.9*   RDW % 17.9* 18.1* 18.0*   PLT 10*9/L 211 222 216   MPV fL 10.0 9.1 9.5     Recent Labs     Units 03/13/24  1826 03/14/24  0407 APTT sec 49.2* 52.5*      Lines & Tubes:   Patient Lines/Drains/Airways Status       Active Peripheral & Central Intravenous Access       Name Placement date Placement time Site Days    Peripheral IV 03/04/24 Anterior;Proximal;Right Forearm 03/04/24  0021  Forearm  10    CVC Triple Lumen 03/07/24 Non-tunneled Left Internal jugular 03/07/24  0849  Internal jugular  7    Hemodialysis Catheter With Distal Infusion Port 03/06/24 Right Internal jugular 1.2 mL 1.2 mL 03/06/24  2301  Internal jugular  7                     Patient Lines/Drains/Airways Status       Active Wounds       Name Placement date Placement time Site Days    Surgical Site 04/08/18 Shoulder Left 04/08/18  1338  -- 2166    Surgical Site 06/05/22 Eye Left 06/05/22  0928  -- 648    Surgical Site 06/19/22 Eye Right 06/19/22  1024  -- 634    Surgical Site 03/03/24 Abdomen 03/03/24  1013  -- 10    Wound 06/24/20 Other (comment) Buttocks  inner right buttock 06/24/20  1010  Buttocks  1359    Wound 03/11/24 Other (comment) Sacrum Mid moisture/friction injury 03/11/24  1200  Sacrum  2    Wound 03/12/24 Other (comment) Groin Right 03/12/24  1500  Groin  1    Wound 03/13/24 Neck Right open area note arount the HD catheter durng the dressing change 03/13/24  0930  Neck  1                     Respiratory/ventilator settings for last 24 hours:   Vent Mode: PSV-CPAP  FiO2 (%): 40 %  PEEP: 8 cm H20  PR SUP: 8 cm H20    Intake/Output last 3 shifts:  I/O last 3 completed shifts:  In: 2109.3 [I.V.:826.5; NG/GT:720; IV Piggyback:562.8]  Out: 3245 [Urine:1660; Emesis/NG output:1400; Drains:185]    Daily/Recent Weight:  69.5 kg (153 lb 3.5 oz)    BMI:  Body mass index is 28.97 kg/m??.    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    Chronic kidney disease     Depression, psychotic (CMS-HCC)     Diabetes mellitus (CMS-HCC)     in past    Emphysema of lung (CMS-HCC)     Financial difficulties     Frail elderly     Hearing impairment     Hernia     History of transfusion     Hyperlipidemia     Hypertension     Impaired mobility     Osteoporosis     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/08/2015    Visual impairment      Past Surgical History:   Procedure Laterality Date    ABDOMINAL SURGERY      BLADDER SURGERY      BREAST CYST EXCISION      CHEMOTHERAPY  2012    bladder    GALLBLADDER SURGERY      stone removal    ILEOSTOMY  2012    PR COLONOSCOPY FLX DX W/COLLJ SPEC WHEN PFRMD N/A 02/09/2015    Procedure: COLONOSCOPY, FLEXIBLE, PROXIMAL TO SPLENIC FLEXURE; DIAGNOSTIC, W/WO COLLECTION SPECIMEN BY BRUSH OR WASH;  Surgeon: Dewaine Conger, MD;  Location: [facility] [place];  Service: Gastroenterology    PR EXPLORATORY OF ABDOMEN N/A 02/28/2024    Procedure: EXPLORATORY LAPAROTOMY, EXPLORATORY CELIOTOMY WITH OR WITHOUT BIOPSY(S);  Surgeon: Renda Rolls, MD;  [facility];  Service: General Surgery    PR RECONSTR TOTAL SHOULDER IMPLANT Left 04/08/2018    Procedure: ARTHROPLASTY, GLENOHUMERAL JOINT; TOTAL SHOULDER(GLENOID & PROXIMAL HUMERAL REPLACEMENT(EG, TOTAL SHOULDER);  Surgeon: Tomasa Rand, MD;  Location: [facility];  Service: Ortho Sports Medicine    PR REOPEN RECENT ABD EXPLORATORY Midline 03/01/2024    Procedure: REOPENING OF RECENT LAPAROTOMY;  Surgeon: [name]., MD;  Location: OR [facility];  Service: Trauma    PR REOPEN RECENT ABD EXPLORATORY N/A 03/03/2024    Procedure: REOPENING OF RECENT LAPAROTOMY;  Surgeon: Joanie Coddington, MD;  Location: OR [facility];  Service: Trauma    PR SIGMOIDOSCOPY,BIOPSY N/A 03/11/2015    Procedure: SIGMOIDOSCOPY, FLEXIBLE; WITH BIOPSY, SINGLE OR MULTIPLE;  Surgeon: Wilburt Finlay, MD;  [facility] [place];  Service: Gastroenterology    PR XCAPSL CTRC RMVL INSJ IO LENS PROSTH W/O ECP Left 06/05/2022 Procedure: EXTRACAPSULAR CATARACT REMOVAL W/INSERTION OF INTRAOCULAR LENS PROSTHESIS, MANUAL OR MECHANICAL TECHNIQUE WITHOUT ENDOSCOPIC CYCLOPHOTOCOAGULATION;  Surgeon: Garner Gavel, MD;  [facility];  Service: Ophthalmology    PR XCAPSL CTRC RMVL INSJ IO LENS PROSTH W/O ECP Right 06/19/2022    Procedure: EXTRACAPSULAR CATARACT REMOVAL W/INSERTION OF INTRAOCULAR LENS PROSTHESIS, MANUAL OR MECHANICAL TECHNIQUE WITHOUT ENDOSCOPIC CYCLOPHOTOCOAGULATION;  Surgeon: Garner Gavel, MD;  [facility]: [facility];  Service: Ophthalmology     Scheduled Medications:   acetaminophen  1,000 mg Intravenous Q8H    amiodarone  150 mg Intravenous daily    aspirin  81 mg Enteral tube: gastric Daily    atorvastatin  40 mg Enteral tube: gastric Daily    budesonide (PULMICORT) nebulizer solution  0.5 mg Nebulization BID (RT)    carboxymethylcellulose sodium  2 drop Both Eyes TID    ceftolozane-tazobactam  750 mg Intravenous Q8H    flu vacc [id number](80yr up)-PF  0.5 mL Intramuscular During hospitalization    fluPHENAZine decanoate  50 mg Intramuscular Q30 Days    [Provider Hold] insulin NPH  5 Units Subcutaneous BID AC    insulin regular  0-20 Units Subcutaneous Q6H SCH    ipratropium  500 mcg Nebulization Q6H (RT)    pantoprazole (Protonix) intravenous solution  40 mg Intravenous Daily    polyethylene glycol  17 g Enteral tube: gastric Daily    [Provider Hold] sennosides  2.5 mL Enteral tube: gastric Nightly    sodium chloride  10 mL Intravenous Q8H    sodium chloride  10 mL Intravenous Q8H    sodium chloride  10 mL Intravenous Q8H    sodium chloride  4 mL Nebulization Q6H (RT)     Continuous Infusions:   EPINEPHrine 1 mcg/min (03/14/24 0600)    Parenteral Nutrition (CENTRAL)      And    fat emulsion 20 % with fish oil      heparin 18 Units/kg/hr (03/14/24 0600)    NxStage RFP 400 (+/- BB) 5000 mL - contains 2 mEq/L of potassium      NxStage/multiBic RFP 401 (+/- BB) 5000 mL - contains 4 mEq/L of potassium      propofol 10 mg/mL infusion Stopped (03/14/24 0816)     PRN Medications:  albuterol, [Provider Hold] cyclobenzaprine, dextrose in water, fentaNYL (PF) **OR** fentaNYL (PF), heparin (porcine), heparin (porcine), nicotine polacrilex **OR** nicotine polacrilex, ondansetron, oxyCODONE **OR** oxyCODONE    Tammy Braun is critically ill due to acute kidney injury, altered mental status, atrial fibrillation, bradycardia, coagulopathy, hypotension, pneumonia, pulmonary embolism, and renal insufficiency. This critical care time includes examining the patient, evaluating the hemodynamic, laboratory, and radiographic data, independently developing a comprehensive management plan, and serially assessing the patient's response to these critical care interventions. This critical care time excludes procedures.    Critical care time: 60 minutes     Tamsen Snider, PA   Surgical Intensive Care Unit  [place] [facility] [place] [facility]

## 2024-03-14 NOTE — Unmapped (Signed)
 Received patient awake and alert, following simple commands appropriately.  Patient has been A. Fib 90s-110s.  Epinephrine restarted to keep MAP > 65.  Lungs clear, diminished bilat.  Suctioned for small amount of sputum from ETT.  Urine output adequate.  Patient turned and positioned Q2H.  See flow sheet for VS and full assessment.  Problem: Adult Inpatient Plan of Care  Goal: Plan of Care Review  Outcome: Progressing  Flowsheets (Taken 03/14/2024 0458)  Progress: improving  Plan of Care Reviewed With:   patient   family  Goal: Patient-Specific Goal (Individualized)  Outcome: Ongoing - Unchanged  Flowsheets (Taken 03/14/2024 0458)  Individualized Care Needs: Patient will maintain MAP > 65 off epinephrine infusion.  Goal: Absence of Hospital-Acquired Illness or Injury  Outcome: Progressing  Intervention: Identify and Manage Fall Risk  Recent Flowsheet Documentation  Taken 03/13/2024 2000 by Everlene Other, RN  Safety Interventions:   aspiration precautions   bed alarm   fall reduction program maintained   family at bedside   low bed  Intervention: Prevent Skin Injury  Recent Flowsheet Documentation  Taken 03/14/2024 0400 by Everlene Other, RN  Positioning for Skin: Right  Taken 03/14/2024 0200 by Everlene Other, RN  Positioning for Skin: Left  Taken 03/14/2024 0000 by Everlene Other, RN  Positioning for Skin: Right  Taken 03/13/2024 2200 by Everlene Other, RN  Positioning for Skin: Left  Taken 03/13/2024 2000 by Everlene Other, RN  Positioning for Skin: Right  Device Skin Pressure Protection:   positioning supports utilized   pressure points protected   tubing/devices free from skin contact  Skin Protection:   adhesive use limited   skin-to-device areas padded   transparent dressing maintained   pulse oximeter probe site changed   tubing/devices free from skin contact   zinc oxide barrier cream  Intervention: Prevent and Manage VTE (Venous Thromboembolism) Risk  Recent Flowsheet Documentation  Taken 03/14/2024 0400 by Everlene Other, RN  Anti-Embolism Device Type: SCD, Knee  Anti-Embolism Device Status: Refused  Anti-Embolism Device Location: BLE  Taken 03/14/2024 0200 by Everlene Other, RN  Anti-Embolism Device Type: SCD, Knee  Anti-Embolism Device Status: On  Anti-Embolism Device Location: BLE  Taken 03/14/2024 0000 by Everlene Other, RN  Anti-Embolism Device Type: SCD, Knee  Anti-Embolism Device Status: On  Anti-Embolism Device Location: BLE  Taken 03/13/2024 2200 by Everlene Other, RN  Anti-Embolism Device Type: SCD, Knee  Anti-Embolism Device Status: Refused  Anti-Embolism Device Location: BLE  Taken 03/13/2024 2000 by Everlene Other, RN  Anti-Embolism Device Type: SCD, Knee  Anti-Embolism Device Status: Refused  Anti-Embolism Device Location: BLE  Intervention: Prevent Infection  Recent Flowsheet Documentation  Taken 03/13/2024 2000 by Everlene Other, RN  Infection Prevention:   equipment surfaces disinfected   single patient room provided  Goal: Optimal Comfort and Wellbeing  Outcome: Progressing  Goal: Readiness for Transition of Care  Outcome: Ongoing - Unchanged     Problem: Infection  Goal: Absence of Infection Signs and Symptoms  Outcome: Ongoing - Unchanged  Intervention: Prevent or Manage Infection  Recent Flowsheet Documentation  Taken 03/13/2024 2000 by Everlene Other, RN  Infection Management: aseptic technique maintained  Isolation Precautions: contact precautions maintained     Problem: Fall Injury Risk  Goal: Absence of Fall and Fall-Related Injury  Outcome: Progressing  Intervention: Promote Injury-Free Environment  Recent Flowsheet Documentation  Taken 03/13/2024 2000 by Everlene Other, RN  Safety Interventions:   aspiration  precautions   bed alarm   fall reduction program maintained   family at bedside   low bed     Problem: Wound  Goal: Optimal Coping  Outcome: Progressing  Goal: Optimal Functional Ability  Outcome: Ongoing - Unchanged  Intervention: Optimize Functional Ability  Recent Flowsheet Documentation  Taken 03/13/2024 2000 by Everlene Other, RN  Activity Management: bedrest  Goal: Absence of Infection Signs and Symptoms  Outcome: Progressing  Intervention: Prevent or Manage Infection  Recent Flowsheet Documentation  Taken 03/13/2024 2000 by Everlene Other, RN  Infection Management: aseptic technique maintained  Isolation Precautions: contact precautions maintained  Goal: Improved Oral Intake  Outcome: Ongoing - Unchanged  Goal: Optimal Pain Control and Function  Outcome: Progressing  Intervention: Prevent or Manage Pain  Recent Flowsheet Documentation  Taken 03/13/2024 2000 by Everlene Other, RN  Sleep/Rest Enhancement:   awakenings minimized   room darkened  Goal: Skin Health and Integrity  Outcome: Progressing  Intervention: Optimize Skin Protection  Recent Flowsheet Documentation  Taken 03/14/2024 0400 by Everlene Other, RN  Head of Bed [facility]) Positioning: HOB at 30 degrees  Taken 03/14/2024 0200 by Alette Kataoka, Anderson Malta, RN  Head of Bed [facility]) Positioning: HOB at 30 degrees  Taken 03/14/2024 0000 by Everlene Other, RN  Head of Bed [facility]) Positioning: HOB at 30 degrees  Taken 03/13/2024 2200 by Lenor Provencher, Anderson Malta, RN  Head of Bed [facility]) Positioning: HOB at 30 degrees  Taken 03/13/2024 2000 by Everlene Other, RN  Activity Management: bedrest  Pressure Reduction Techniques:   heels elevated off bed   weight shift assistance provided  Head of Bed (HOB) Positioning: HOB at 30 degrees  Pressure Reduction Devices:   positioning supports utilized   pressure-redistributing mattress utilized   specialty bed utilized  Skin Protection:   adhesive use limited   skin-to-device areas padded   transparent dressing maintained   pulse oximeter probe site changed   tubing/devices free from skin contact   zinc oxide barrier cream  Goal: Optimal Wound Healing  Outcome: Progressing  Intervention: Promote Wound Healing  Recent Flowsheet Documentation  Taken 03/13/2024 2000 by Everlene Other, RN  Sleep/Rest Enhancement:   awakenings minimized   room darkened     Problem: Skin Injury Risk Increased  Goal: Skin Health and Integrity  Outcome: Progressing  Intervention: Optimize Skin Protection  Recent Flowsheet Documentation  Taken 03/14/2024 0400 by Everlene Other, RN  Head of Bed [facility]) Positioning: HOB at 30 degrees  Taken 03/14/2024 0200 by Chayil Gantt, Anderson Malta, RN  Head of Bed [facility]) Positioning: HOB at 30 degrees  Taken 03/14/2024 0000 by Everlene Other, RN  Head of Bed [facility]) Positioning: HOB at 30 degrees  Taken 03/13/2024 2200 by Ramisa Duman, Anderson Malta, RN  Head of Bed [facility]) Positioning: HOB at 30 degrees  Taken 03/13/2024 2000 by Everlene Other, RN  Activity Management: bedrest  Pressure Reduction Techniques:   heels elevated off bed   weight shift assistance provided  Head of Bed (HOB) Positioning: HOB at 30 degrees  Pressure Reduction Devices:   positioning supports utilized   pressure-redistributing mattress utilized   specialty bed utilized  Skin Protection:   adhesive use limited   skin-to-device areas padded   transparent dressing maintained   pulse oximeter probe site changed   tubing/devices free from skin contact   zinc oxide barrier cream     Problem: Self-Care Deficit  Goal: Improved Ability to Complete Activities of Daily Living  Outcome: Ongoing - Unchanged     Problem: Mechanical Ventilation Invasive  Goal: Optimal Device Function  Intervention: Optimize Device Care and Function  Recent Flowsheet Documentation  Taken 03/14/2024 0400 by Everlene Other, RN  Oral Care:   mouth swabbed   oral rinse provided   suction provided  Taken 03/14/2024 0000 by Everlene Other, RN  Oral Care:   mouth swabbed   oral rinse provided   suction provided  Taken 03/13/2024 2000 by Everlene Other, RN  Oral Care:   teeth brushed   tongue brushed   mouth swabbed   oral rinse provided   suction provided  Goal: Mechanical Ventilation Liberation  Intervention: Promote Extubation and Careers adviser  Recent Flowsheet Documentation  Taken 03/13/2024 2000 by Everlene Other, RN  Sleep/Rest Enhancement:   awakenings minimized   room darkened  Goal: Absence of Device-Related Skin and Tissue Injury  Intervention: Maintain Skin and Tissue Health  Recent Flowsheet Documentation  Taken 03/13/2024 2000 by Everlene Other, RN  Device Skin Pressure Protection:   positioning supports utilized   pressure points protected   tubing/devices free from skin contact  Goal: Absence of Ventilator-Induced Lung Injury  Intervention: Prevent Ventilator-Associated Pneumonia  Recent Flowsheet Documentation  Taken 03/14/2024 0400 by Everlene Other, RN  Head of Bed [facility]) Positioning: HOB at 30 degrees  Oral Care:   mouth swabbed   oral rinse provided   suction provided  Taken 03/14/2024 0200 by Tabetha Haraway, Anderson Malta, RN  Head of Bed [facility]) Positioning: HOB at 30 degrees  Taken 03/14/2024 0000 by Eniyah Eastmond, Anderson Malta, RN  Head of Bed [facility]) Positioning: HOB at 30 degrees  Oral Care:   mouth swabbed   oral rinse provided   suction provided  Taken 03/13/2024 2200 by Pedram Goodchild, Anderson Malta, RN  Head of Bed [facility]) Positioning: HOB at 30 degrees  Taken 03/13/2024 2000 by Everlene Other, RN  Head of Bed [facility]) Positioning: HOB at 30 degrees  Oral Care:   teeth brushed   tongue brushed   mouth swabbed   oral rinse provided   suction provided     Problem: Mechanical Ventilation Invasive  Goal: Effective Communication  Outcome: Progressing  Goal: Optimal Device Function  Outcome: Progressing  Intervention: Optimize Device Care and Function  Recent Flowsheet Documentation  Taken 03/14/2024 0400 by Everlene Other, RN  Oral Care:   mouth swabbed   oral rinse provided   suction provided  Taken 03/14/2024 0000 by Everlene Other, RN  Oral Care:   mouth swabbed   oral rinse provided   suction provided  Taken 03/13/2024 2000 by Everlene Other, RN  Oral Care:   teeth brushed   tongue brushed   mouth swabbed   oral rinse provided   suction provided  Goal: Mechanical Ventilation Liberation  Outcome: Progressing  Intervention: Promote Extubation and Mechanical Ventilation Liberation  Recent Flowsheet Documentation  Taken 03/13/2024 2000 by Everlene Other, RN  Sleep/Rest Enhancement:   awakenings minimized   room darkened  Goal: Optimal Nutrition Delivery  Outcome: Ongoing - Unchanged  Goal: Absence of Device-Related Skin and Tissue Injury  Outcome: Progressing  Intervention: Maintain Skin and Tissue Health  Recent Flowsheet Documentation  Taken 03/13/2024 2000 by Everlene Other, RN  Device Skin Pressure Protection:   positioning supports utilized   pressure points protected   tubing/devices free from skin contact  Goal: Absence of Ventilator-Induced Lung Injury  Outcome: Progressing  Intervention: Prevent  Ventilator-Associated Pneumonia  Recent Flowsheet Documentation  Taken 03/14/2024 0400 by Everlene Other, RN  Head of Bed [facility]) Positioning: HOB at 30 degrees  Oral Care:   mouth swabbed   oral rinse provided   suction provided  Taken 03/14/2024 0200 by Beth Spackman, Anderson Malta, RN  Head of Bed [facility]) Positioning: HOB at 30 degrees  Taken 03/14/2024 0000 by Juwuan Sedita, Anderson Malta, RN  Head of Bed [facility]) Positioning: HOB at 30 degrees  Oral Care:   mouth swabbed   oral rinse provided   suction provided  Taken 03/13/2024 2200 by Maeci Kalbfleisch, Anderson Malta, RN  Head of Bed [facility]) Positioning: HOB at 30 degrees  Taken 03/13/2024 2000 by Everlene Other, RN  Head of Bed [facility]) Positioning: HOB at 30 degrees  Oral Care:   teeth brushed   tongue brushed   mouth swabbed   oral rinse provided   suction provided     Problem: Artificial Airway  Goal: Effective Communication  Outcome: Ongoing - Unchanged  Goal: Optimal Device Function  Outcome: Progressing  Intervention: Optimize Device Care and Function  Recent Flowsheet Documentation  Taken 03/14/2024 0400 by Everlene Other, RN  Oral Care:   mouth swabbed   oral rinse provided   suction provided  Taken 03/14/2024 0000 by Everlene Other, RN  Oral Care:   mouth swabbed   oral rinse provided   suction provided  Taken 03/13/2024 2000 by Everlene Other, RN  Aspiration Precautions:   respiratory status monitored   upright posture maintained  Oral Care:   teeth brushed   tongue brushed   mouth swabbed   oral rinse provided   suction provided  Goal: Absence of Device-Related Skin or Tissue Injury  Outcome: Progressing  Intervention: Maintain Skin and Tissue Health  Recent Flowsheet Documentation  Taken 03/13/2024 2000 by Everlene Other, RN  Device Skin Pressure Protection:   positioning supports utilized   pressure points protected   tubing/devices free from skin contact     Problem: Malnutrition  Goal: Improved Nutritional Intake  Outcome: Ongoing - Unchanged

## 2024-03-14 NOTE — Unmapped (Signed)
 PHYSICAL THERAPY  Evaluation (03/14/24 1505)          Patient Name:  Tammy Braun       Medical Record Number: [id number]   Date of Birth: 07-17-1941  Sex: Female        Post-Discharge Physical Therapy Recommendations:  PT Post Acute Discharge Recommendations: 5x weekly, Low intensity   Equipment Recommendation  PT DME Recommendations: Defer to post acute          Treatment Diagnosis: Abnormalities of gait and mobility        Activity Tolerance: Limited by fatigue     ASSESSMENT  Problem List: Decreased strength, Impaired balance, Decreased endurance, Decreased mobility, Fall risk      Assessment : Pt evaluated s//p extubation. Dtr and pt would like pt to get to chair, but pt unable to achieve fully upright stand to safely transfer to the chair. Asissted pt into chair position. Pt will benefit from continued acute and post acute 5xL.      Today's Interventions: Education: PT POC/ role     Personal Factors/Comorbidities Present: 3+   Examination of Body System: Musculoskeletal, Cardiovascular, Activity/participation, Pulmonary, Integumentary  Clinical Presentation: Unstable    Clinical Decision Making: High        PLAN  Planned Frequency of Treatment: Plan of Care Initiated: 03/14/24  1-2x per day Weekly Frequency: 3-4 days per week  Planned Treatment Duration: 03/28/24     Planned Interventions: Education (Patient/Family/Caregiver), Self-care / Home Management training, Gait training, Therapeutic Exercise, Therapeutic Activity     Goals:   Patient and Family Goals: To sit in the chair.     SHORT GOAL #1: Pt will complete bed mobility with min A.               Time Frame : 2 weeks  SHORT GOAL #2: Pt will complete OOB transfers with  min A and LRAD              Time Frame : 2 weeks  SHORT GOAL #3: Pt will ambulate 87ft with LRAD and CGA              Time Frame : 2 weeks                       Long Term Goal #1: Pt will ambulate 500 ft with independence and LRAD for community distances.  Time Frame: 2 months Prognosis:  Fair  Positive Indicators: PLOF, family support  Barriers to Discharge: Functional strength deficits, Decreased range of motion, Decreased safety awareness, Endurance deficits, Impaired Balance, Gait instability, Cognitive deficits, Inaccessible home environment, Poor insight into deficits     SUBJECTIVE  Communication Preference: Verbal     Patient reports: Pt willing to work with PT. Unable to talk d/t soreness in throat. Pt's dtr present.  Pain Comments: Denies pain.        Prior Functional Status: Per daughter, pt ambulates with RW in the home, cane on the stairs, and transport w/c when out in community.-initiatl eval.  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    Chronic kidney disease  Depression, psychotic (CMS-HCC)     Diabetes mellitus (CMS-HCC)     in past    Emphysema of lung (CMS-HCC)     Financial difficulties     Frail elderly     Hearing impairment     Hernia     History of transfusion     Hyperlipidemia     Hypertension     Impaired mobility     Osteoporosis     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/08/2015    Visual impairment             Social History     Tobacco Use    Smoking status: Every Day     Current packs/day: 1.00     Average packs/day: 1 pack/day for 65.9 years (65.9 ttl pk-yrs)     Types: Cigarettes     Start date: 04/09/1958    Smokeless tobacco: Never    Tobacco comments:     15cpd   Substance Use Topics    Alcohol use: No     Alcohol/week: 0.0 standard drinks of alcohol       Past Surgical History:   Procedure Laterality Date    ABDOMINAL SURGERY      BLADDER SURGERY      BREAST CYST EXCISION      CHEMOTHERAPY  2012    bladder    GALLBLADDER SURGERY      stone removal    ILEOSTOMY  2012    PR COLONOSCOPY FLX DX W/COLLJ SPEC WHEN PFRMD N/A 02/09/2015    Procedure: COLONOSCOPY, FLEXIBLE, PROXIMAL TO SPLENIC FLEXURE; DIAGNOSTIC, W/WO COLLECTION SPECIMEN BY BRUSH OR WASH;  Surgeon: Dewaine Conger, MD;  Location: [facility] [place];  Service: Gastroenterology    PR EXPLORATORY OF ABDOMEN N/A 02/28/2024    Procedure: EXPLORATORY LAPAROTOMY, EXPLORATORY CELIOTOMY WITH OR WITHOUT BIOPSY(S);  Surgeon: Renda Rolls, MD;  Location: [facility];  Service: General Surgery    PR RECONSTR TOTAL SHOULDER IMPLANT Left 04/08/2018    Procedure: ARTHROPLASTY, GLENOHUMERAL JOINT; TOTAL SHOULDER(GLENOID & PROXIMAL HUMERAL REPLACEMENT(EG, TOTAL SHOULDER);  Surgeon: Tomasa Rand, MD;  [facility];  Service: Ortho Sports Medicine    PR REOPEN RECENT ABD EXPLORATORY Midline 03/01/2024    Procedure: REOPENING OF RECENT LAPAROTOMY;  Surgeon: [name]., MD;  Location: OR [facility];  Service: Trauma    PR REOPEN RECENT ABD EXPLORATORY N/A 03/03/2024    Procedure: REOPENING OF RECENT LAPAROTOMY;  Surgeon: Joanie Coddington, MD;  Location: OR [facility];  Service: Trauma    PR SIGMOIDOSCOPY,BIOPSY N/A 03/11/2015    Procedure: SIGMOIDOSCOPY, FLEXIBLE; WITH BIOPSY, SINGLE OR MULTIPLE;  Surgeon: Wilburt Finlay, MD;  [facility];  Service: Gastroenterology    PR XCAPSL CTRC RMVL INSJ IO LENS PROSTH W/O ECP Left 06/05/2022    Procedure: EXTRACAPSULAR CATARACT REMOVAL W/INSERTION OF INTRAOCULAR LENS PROSTHESIS, MANUAL OR MECHANICAL TECHNIQUE WITHOUT ENDOSCOPIC CYCLOPHOTOCOAGULATION;  Surgeon: Garner Gavel, MD;  Location: [facility] OR [facility];  Service: Ophthalmology    PR XCAPSL CTRC RMVL INSJ IO LENS PROSTH W/O ECP Right 06/19/2022    Procedure: EXTRACAPSULAR CATARACT REMOVAL W/INSERTION OF INTRAOCULAR LENS PROSTHESIS, MANUAL OR MECHANICAL TECHNIQUE WITHOUT ENDOSCOPIC CYCLOPHOTOCOAGULATION;  Surgeon: Garner Gavel, MD;  Location[facility];  Service: Ophthalmology             Family History   Problem Relation Age of Onset    Breast cancer Daughter 81  Diabetes Mother     Glaucoma Father     Colon cancer Neg Hx     Ovarian cancer Neg Hx     Endometrial cancer Neg Hx     Anesthesia problems Neg Hx     Bleeding Disorder Neg Hx         Allergies: Lisinopril, Losartan, and Hctz [hydrochlorothiazide]                  Objective Findings  Precautions / Restrictions  Precautions: Falls precautions, Other precautions  Weight Bearing Status: Non-applicable  Required Braces or Orthoses: Non-applicable  Precautions / Restrictions comments: abdominal     Medical Tests / Procedures: Pt re-intubated 3/12 d/t mucus plug and extubated 3/15.  Equipment / Environment: Vascular access (PIV, TLC, Port-a-cath, PICC), NGT, Telemetry, Foley, Arterial line, Colostomy, JP drain(s), Supplemental oxygen     Vitals/Orthostatics : VSS per monitor.     Living Situation  Living Environment: House  Lives With: Daughter (69 yo grandson)  Home Living: One level home, Stairs to enter with rails, Tub/shower unit, Shower chair with back, Grab bars in shower, Hand-held shower hose, Handicapped height toilet  Rail placement (outside): Bilateral rails in reach  Number of Stairs to Enter (outside): 4  Caregiver Identified?: Yes  Caregiver Availability: 24 hours  Caregiver Ability: Limited lifting      Cognition: Follows 1-step commands  Visual/Perception: Wears Glasses/Contacts all the time  Hearing: Hearing impairment     Skin Inspection: Intact where visualized     Upper Extremities  UE ROM: Left Impaired/Limited, Right WFL  UE Strength: Left Impaired/Limited, Right Impaired/Limited  RUE Strength Impairment: Reduced strength  LUE Strength Impairment: Reduced strength  UE comment: Grossly 2+/5    Lower Extremities  LE ROM: Right WFL, Left WFL  LE Strength: Right Impaired/Limited, Left Impaired/Limited  RLE Strength Impairment: Reduced strength  LLE Strength Impairment: Reduced strength  LE comment: Grossly 2/5 BLE.          Coordination: Not tested  Proprioception: Not tested  Sensation: Impaired  Posture: Trunk control impaired    Static Sitting-Level of Assistance: Minimum assistance, Standby assistance  Dynamic Sitting-Level of Assistance: Minimum assistance, Standby assistance    Static Standing-Level of Assistance: Unable to assess  Dynamic Standing - Level of Assistance: Unable to assess      Bed Mobility: Supine to Sit, Rolling Left, Rolling Right  Rolling right assistance level: Maximum assist, patient does 25-49%  Rolling left assistance level: Maximum assist, patient does 25-49%  Supine to Sit assistance level: Maximum assist, patient does 25-49%, +2 assist  Bed Mobility comments: VCs for sequencing.     Transfers: Sit to Stand  Sit to Stand assistance level: Maximum assist, patient does 25-49%, +2 assist  Transfer comments: Bilateral knees buckling with first stand and right knee buckling with second stand. Pt unable to achieve fully upright stand.      Skilled Treatment Performed: NT                  Endurance: Fair    Patient at end of session: All needs in reach, In bed, Friends/Family present, Notified Nurse, Lines intact    Physical Therapy Session Duration  PT Individual [mins]: 8  PT Co-Treatment [mins]: 23 (Emily E, OT)  Reason for Co-treatment: Poor activity tolerance, Requires heavy assist for safety, To safely progress mobility          AM-PAC-6 click  Help currently need turning over In bed?: A lot - Maximum/Moderate  Assistance  Help currently needed sitting down/standing up from chair with arms? : A lot - Maximum/Moderate Assistance  Help currently needed moving from supine to sitting on edge of bed?: A lot - Maximum/Moderate Assistance  Help currently needed moving to and from bed from wheelchair?: Unable to do/total assistance - Total Dependent Assist  Help currently needed walking in a hospital room?: Unable to do/total assistance - Total Dependent Assist  Help currently needed climbing 3-5 steps with railing?: Unable to do/total assistance - Total Dependent Assist    Basic Mobility Score 6 click: 9    6 click Score (in points): % of Functional Impairment, Limitation, Restriction  6: 100% impaired, limited, restricted  7-8: At least 80%, but less than 100% impaired, limited restricted  9-13: At least 60%, but less than 80% impaired, limited restricted  14-19: At least 40%, but less than 60% impaired, limited restricted  20-22: At least 20%, but less than 40% impaired, limited restricted  23: At least 1%, but less than 20% impaired, limited restricted  24: 0% impaired, limited restricted    'AM-PAC' forms are Copyright protected by [employer] [facility]         I attest that I have reviewed the above information.  Signed: Fransico Setters, PT  Filed 03/14/2024

## 2024-03-14 NOTE — Unmapped (Signed)
 [place] discontinued today. Epinephrine remains off. LR bolus with goal euvolemia.   Extubated to HFNC 60L 40% at 1208. Sat up with PT/OT side of bed, put in chair position and bed CPT for pulmonary toileting. Daughter at bedside and aware of plan of care updated by SICU team.     Problem: Adult Inpatient Plan of Care  Goal: Plan of Care Review  Outcome: Ongoing - Unchanged  Goal: Patient-Specific Goal (Individualized)  Outcome: Ongoing - Unchanged  Goal: Absence of Hospital-Acquired Illness or Injury  Outcome: Ongoing - Unchanged  Intervention: Identify and Manage Fall Risk  Recent Flowsheet Documentation  Taken 03/14/2024 0800 by Henderson Baltimore, RN  Safety Interventions:   aspiration precautions   bed alarm   environmental modification   fall reduction program maintained   enteral feeding safety   lighting adjusted for tasks/safety   low bed   bleeding precautions   family at bedside   infection management   isolation precautions   muscle strengthening facilitated   safety attendant  Intervention: Prevent Skin Injury  Recent Flowsheet Documentation  Taken 03/14/2024 1800 by Henderson Baltimore, RN  Positioning for Skin: Left  Device Skin Pressure Protection:   absorbent pad utilized/changed   pressure points protected   positioning supports utilized   skin-to-skin areas padded   skin-to-device areas padded   tubing/devices free from skin contact  Skin Protection:   silicone foam dressing in place   skin sealant/moisture barrier applied   skin-to-device areas padded   skin-to-skin areas padded   transparent dressing maintained   tubing/devices free from skin contact  Taken 03/14/2024 1600 by Henderson Baltimore, RN  Positioning for Skin: Right  Device Skin Pressure Protection:   absorbent pad utilized/changed   skin-to-device areas padded   pressure points protected   positioning supports utilized   skin-to-skin areas padded   tubing/devices free from skin contact  Skin Protection:   silicone foam dressing in place   skin sealant/moisture barrier applied   skin-to-device areas padded   skin-to-skin areas padded   tubing/devices free from skin contact   transparent dressing maintained  Taken 03/14/2024 1400 by Henderson Baltimore, RN  Positioning for Skin: Left  Device Skin Pressure Protection:   absorbent pad utilized/changed   positioning supports utilized   pressure points protected   skin-to-device areas padded   skin-to-skin areas padded   tubing/devices free from skin contact  Skin Protection:   incontinence pads utilized   skin-to-device areas padded   skin-to-skin areas padded   transparent dressing maintained   tubing/devices free from skin contact  Taken 03/14/2024 1200 by Henderson Baltimore, RN  Positioning for Skin: Supine/Back  Device Skin Pressure Protection:   absorbent pad utilized/changed   positioning supports utilized   pressure points protected   skin-to-device areas padded   skin-to-skin areas padded   tubing/devices free from skin contact  Skin Protection:   silicone foam dressing in place   skin sealant/moisture barrier applied   skin-to-device areas padded   skin-to-skin areas padded   transparent dressing maintained   incontinence pads utilized  Taken 03/14/2024 1000 by Henderson Baltimore, RN  Positioning for Skin: Left  Device Skin Pressure Protection:   absorbent pad utilized/changed   skin-to-device areas padded   pressure points protected   positioning supports utilized   skin-to-skin areas padded   tubing/devices free from skin contact  Skin Protection:   incontinence pads utilized   silicone foam dressing in place   skin sealant/moisture barrier applied   skin-to-device  areas padded   skin-to-skin areas padded   transparent dressing maintained   tubing/devices free from skin contact  Taken 03/14/2024 0800 by Henderson Baltimore, RN  Positioning for Skin: Left  Device Skin Pressure Protection:   absorbent pad utilized/changed   positioning supports utilized   skin-to-device areas padded   pressure points protected skin-to-skin areas padded   tubing/devices free from skin contact  Skin Protection:   silicone foam dressing in place   skin sealant/moisture barrier applied   skin-to-device areas padded   skin-to-skin areas padded   transparent dressing maintained   tubing/devices free from skin contact  Intervention: Prevent and Manage VTE (Venous Thromboembolism) Risk  Recent Flowsheet Documentation  Taken 03/14/2024 1800 by Henderson Baltimore, RN  Anti-Embolism Device Type: SCD, Knee  Anti-Embolism Device Status: On  Anti-Embolism Device Location: BLE  Taken 03/14/2024 1600 by Henderson Baltimore, RN  Anti-Embolism Device Type: SCD, Knee  Anti-Embolism Device Status: On  Anti-Embolism Device Location: BLE  Taken 03/14/2024 1400 by Henderson Baltimore, RN  Anti-Embolism Device Type: SCD, Knee  Anti-Embolism Device Status: On  Anti-Embolism Device Location: BLE  Taken 03/14/2024 1215 by Henderson Baltimore, RN  Anti-Embolism Device Type: SCD, Knee  Anti-Embolism Device Status: On  Anti-Embolism Device Location: BLE  Taken 03/14/2024 1200 by Henderson Baltimore, RN  Anti-Embolism Device Type: SCD, Knee  Anti-Embolism Device Status: On  Anti-Embolism Device Location: BLE  Taken 03/14/2024 1000 by Henderson Baltimore, RN  Anti-Embolism Device Type: SCD, Knee  Anti-Embolism Device Status: On  Anti-Embolism Device Location: BLE  Taken 03/14/2024 0800 by Henderson Baltimore, RN  Anti-Embolism Device Type: SCD, Knee  Anti-Embolism Device Status: On  Anti-Embolism Device Location: BLE  Intervention: Prevent Infection  Recent Flowsheet Documentation  Taken 03/14/2024 0800 by Henderson Baltimore, RN  Infection Prevention:   cohorting utilized   equipment surfaces disinfected   personal protective equipment utilized   hand hygiene promoted   rest/sleep promoted   environmental surveillance performed  Goal: Optimal Comfort and Wellbeing  Outcome: Ongoing - Unchanged  Goal: Readiness for Transition of Care  Outcome: Ongoing - Unchanged  Goal: Rounds/Family Conference  Outcome: Ongoing - Unchanged

## 2024-03-14 NOTE — Unmapped (Signed)
 Parenteral  Nutrition Consult - Brief      Nutrition Support Team consulted for PN initiation.    PN Indication: bowel obstruction  Venous access: non-tunneled CVC    PN to start 03/14/24 at 2[address] [facility] Adult standard hangtime)  NST will follow and make adjustments as clinically indicated.

## 2024-03-14 NOTE — Unmapped (Signed)
 Pt remains intubated and on vent on PSV 8/+8 and tolerating well. Performed SBT this AM with passing results. RSBI 58. Airway patent and secure. ETT rotated per protocol. Emergency supplies at bedside. RT will continue to monitor.

## 2024-03-15 LAB — BASIC METABOLIC PANEL
ANION GAP: 14 mmol/L (ref 5–14)
ANION GAP: 11 mmol/L (ref 5–14)
ANION GAP: 12 mmol/L (ref 5–14)
BLOOD UREA NITROGEN: 39 mg/dL — ABNORMAL HIGH (ref 9–23)
BLOOD UREA NITROGEN: 39 mg/dL — ABNORMAL HIGH (ref 9–23)
BLOOD UREA NITROGEN: 44 mg/dL — ABNORMAL HIGH (ref 9–23)
BUN / CREAT RATIO: 31
BUN / CREAT RATIO: 33
BUN / CREAT RATIO: 36
CALCIUM: 8.2 mg/dL — ABNORMAL LOW (ref 8.7–10.4)
CALCIUM: 8 mg/dL — ABNORMAL LOW (ref 8.7–10.4)
CALCIUM: 8.4 mg/dL — ABNORMAL LOW (ref 8.7–10.4)
CHLORIDE: 105 mmol/L (ref 98–107)
CHLORIDE: 106 mmol/L (ref 98–107)
CHLORIDE: 106 mmol/L (ref 98–107)
CO2: 21 mmol/L (ref 20.0–31.0)
CO2: 22 mmol/L (ref 20.0–31.0)
CO2: 23 mmol/L (ref 20.0–31.0)
CREATININE: 1.27 mg/dL — ABNORMAL HIGH (ref 0.55–1.02)
CREATININE: 1.18 mg/dL — ABNORMAL HIGH (ref 0.55–1.02)
CREATININE: 1.21 mg/dL — ABNORMAL HIGH (ref 0.55–1.02)
EGFR CKD-EPI (2021) FEMALE: 43 mL/min/{1.73_m2} — ABNORMAL LOW (ref >=60)
EGFR CKD-EPI (2021) FEMALE: 46 mL/min/{1.73_m2} — ABNORMAL LOW (ref >=60)
EGFR CKD-EPI (2021) FEMALE: 45 mL/min/{1.73_m2} — ABNORMAL LOW (ref >=60)
GLUCOSE RANDOM: 213 mg/dL — ABNORMAL HIGH (ref 70–179)
GLUCOSE RANDOM: 228 mg/dL — ABNORMAL HIGH (ref 70–179)
GLUCOSE RANDOM: 196 mg/dL — ABNORMAL HIGH (ref 70–179)
POTASSIUM: 4.2 mmol/L (ref 3.4–4.8)
POTASSIUM: 4 mmol/L (ref 3.4–4.8)
POTASSIUM: 4 mmol/L (ref 3.4–4.8)
SODIUM: 140 mmol/L (ref 135–145)
SODIUM: 139 mmol/L (ref 135–145)
SODIUM: 141 mmol/L (ref 135–145)

## 2024-03-15 LAB — CBC
HEMATOCRIT: 23.7 % — ABNORMAL LOW (ref 34.0–44.0)
HEMOGLOBIN: 7.8 g/dL — ABNORMAL LOW (ref 11.3–14.9)
MEAN CORPUSCULAR HEMOGLOBIN CONC: 33.1 g/dL (ref 32.0–36.0)
MEAN CORPUSCULAR HEMOGLOBIN: 28.1 pg (ref 25.9–32.4)
MEAN CORPUSCULAR VOLUME: 85 fL (ref 77.6–95.7)
MEAN PLATELET VOLUME: 9.7 fL (ref 6.8–10.7)
PLATELET COUNT: 243 10*9/L (ref 150–450)
RED BLOOD CELL COUNT: 2.79 10*12/L — ABNORMAL LOW (ref 3.95–5.13)
RED CELL DISTRIBUTION WIDTH: 18.2 % — ABNORMAL HIGH (ref 12.2–15.2)
WBC ADJUSTED: 10.6 10*9/L (ref 3.6–11.2)

## 2024-03-15 LAB — BLOOD GAS CRITICAL CARE PANEL, ARTERIAL
BASE EXCESS ARTERIAL: -4.2 — ABNORMAL LOW (ref -2.0–2.0)
CALCIUM IONIZED ARTERIAL (MG/DL): 4.74 mg/dL (ref 4.40–5.40)
GLUCOSE WHOLE BLOOD: 207 mg/dL — ABNORMAL HIGH (ref 70–179)
HCO3 ARTERIAL: 21 mmol/L — ABNORMAL LOW (ref 22–27)
HEMOGLOBIN BLOOD GAS: 11.3 g/dL — ABNORMAL LOW (ref 12.00–16.00)
LACTATE BLOOD ARTERIAL: 0.8 mmol/L (ref <1.3)
O2 SATURATION ARTERIAL: 92.3 % — ABNORMAL LOW (ref 94.0–100.0)
PCO2 ARTERIAL: 40.3 mmHg (ref 35.0–45.0)
PH ARTERIAL: 7.33 — ABNORMAL LOW (ref 7.35–7.45)
PO2 ARTERIAL: 72.8 mmHg — ABNORMAL LOW (ref 80.0–110.0)
POTASSIUM WHOLE BLOOD: 3.9 mmol/L (ref 3.4–4.6)
SODIUM WHOLE BLOOD: 139 mmol/L (ref 135–145)

## 2024-03-15 LAB — HEPATIC FUNCTION PANEL
ALBUMIN: 1.8 g/dL — ABNORMAL LOW (ref 3.4–5.0)
ALBUMIN: 1.9 g/dL — ABNORMAL LOW (ref 3.4–5.0)
ALKALINE PHOSPHATASE: 58 U/L (ref 46–116)
ALKALINE PHOSPHATASE: 60 U/L (ref 46–116)
ALT (SGPT): 11 U/L (ref 10–49)
ALT (SGPT): 10 U/L (ref 10–49)
AST (SGOT): 13 U/L (ref <=34)
AST (SGOT): 13 U/L (ref <=34)
BILIRUBIN DIRECT: 0.2 mg/dL (ref 0.00–0.30)
BILIRUBIN DIRECT: 0.2 mg/dL (ref 0.00–0.30)
BILIRUBIN TOTAL: 0.4 mg/dL (ref 0.3–1.2)
BILIRUBIN TOTAL: 0.3 mg/dL (ref 0.3–1.2)
PROTEIN TOTAL: 5.4 g/dL — ABNORMAL LOW (ref 5.7–8.2)
PROTEIN TOTAL: 5.7 g/dL (ref 5.7–8.2)

## 2024-03-15 LAB — CYSTATIN C
CYSTATIN C: 2.31 mg/L — ABNORMAL HIGH (ref 0.64–1.23)
EGFR CKD-EPI (2012) CYSTATIN C FEMALE: 22 mL/min/{1.73_m2} — ABNORMAL LOW (ref >=60)

## 2024-03-15 LAB — PHOSPHORUS
PHOSPHORUS: 3.8 mg/dL (ref 2.4–5.1)
PHOSPHORUS: 3.7 mg/dL (ref 2.4–5.1)
PHOSPHORUS: 4.1 mg/dL (ref 2.4–5.1)

## 2024-03-15 LAB — MAGNESIUM
MAGNESIUM: 2 mg/dL (ref 1.6–2.6)
MAGNESIUM: 2 mg/dL (ref 1.6–2.6)
MAGNESIUM: 1.9 mg/dL (ref 1.6–2.6)

## 2024-03-15 LAB — TRIGLYCERIDES
TRIGLYCERIDES: 59 mg/dL (ref 0–150)
TRIGLYCERIDES: 101 mg/dL (ref 0–150)

## 2024-03-15 LAB — APTT
APTT: 80.6 s — ABNORMAL HIGH (ref 24.8–38.4)
HEPARIN CORRELATION: 0.5

## 2024-03-15 MED ADMIN — heparin 25,000 Units/250 mL (100 units/mL) in 0.45% saline infusion (premade): 0-24 [IU]/kg/h | INTRAVENOUS | @ 15:00:00

## 2024-03-15 MED ADMIN — HYDROmorphone (PF) (DILAUDID) injection Syrg 0.5 mg: .5 mg | INTRAVENOUS | @ 18:00:00 | Stop: 2024-03-28

## 2024-03-15 MED ADMIN — sodium chloride (NS) 0.9 % flush 10 mL: 10 mL | INTRAVENOUS | @ 10:00:00

## 2024-03-15 MED ADMIN — ipratropium (ATROVENT) 0.02 % nebulizer solution 500 mcg: 500 ug | RESPIRATORY_TRACT | @ 19:00:00

## 2024-03-15 MED ADMIN — sodium chloride (NS) 0.9 % flush 10 mL: 10 mL | INTRAVENOUS | @ 17:00:00

## 2024-03-15 MED ADMIN — sodium chloride 3 % NEBULIZER solution 4 mL: 4 mL | RESPIRATORY_TRACT | @ 13:00:00

## 2024-03-15 MED ADMIN — furosemide (LASIX) injection 40 mg: 40 mg | INTRAVENOUS | @ 13:00:00 | Stop: 2024-03-15

## 2024-03-15 MED ADMIN — bisacodyl (DULCOLAX) suppository 10 mg: 10 mg | RECTAL | @ 14:00:00 | Stop: 2024-03-15

## 2024-03-15 MED ADMIN — ceftolozane-tazobactam (ZERBAXA) 750 mg in sodium chloride (NS) 0.9 % 100 mL IVPB: 750 mg | INTRAVENOUS | @ 12:00:00 | Stop: 2024-03-18

## 2024-03-15 MED ADMIN — ceftolozane-tazobactam (ZERBAXA) 750 mg in sodium chloride (NS) 0.9 % 100 mL IVPB: 750 mg | INTRAVENOUS | @ 04:00:00 | Stop: 2024-03-18

## 2024-03-15 MED ADMIN — acetaminophen (OFIRMEV) 10 mg/mL injection 1,000 mg: 1000 mg | INTRAVENOUS | Stop: 2024-03-15

## 2024-03-15 MED ADMIN — acetaminophen (OFIRMEV) 10 mg/mL injection 1,000 mg: 1000 mg | INTRAVENOUS | @ 07:00:00 | Stop: 2024-03-15

## 2024-03-15 MED ADMIN — sodium chloride (NS) 0.9 % flush 10 mL: 10 mL | INTRAVENOUS | @ 02:00:00

## 2024-03-15 MED ADMIN — amiodarone in dextrose,iso-osm (NEXTERONE) 150 mg/100 mL (1.5 mg/mL) IVPB 150 mg: 150 mg | INTRAVENOUS | @ 09:00:00

## 2024-03-15 MED ADMIN — ipratropium (ATROVENT) 0.02 % nebulizer solution 500 mcg: 500 ug | RESPIRATORY_TRACT | @ 13:00:00

## 2024-03-15 MED ADMIN — sodium chloride 3 % NEBULIZER solution 4 mL: 4 mL | RESPIRATORY_TRACT | @ 08:00:00

## 2024-03-15 MED ADMIN — insulin regular (HumuLIN,NovoLIN) injection 0-20 Units: 0-20 [IU] | SUBCUTANEOUS | @ 21:00:00

## 2024-03-15 MED ADMIN — Parenteral Nutrition (CENTRAL): INTRAVENOUS | @ 03:00:00 | Stop: 2024-03-15

## 2024-03-15 MED ADMIN — insulin regular (HumuLIN,NovoLIN) injection 0-20 Units: 0-20 [IU] | SUBCUTANEOUS | @ 15:00:00

## 2024-03-15 MED ADMIN — budesonide (PULMICORT) nebulizer solution 0.5 mg: .5 mg | RESPIRATORY_TRACT | @ 13:00:00

## 2024-03-15 MED ADMIN — pantoprazole (Protonix) injection 40 mg: 40 mg | INTRAVENOUS | @ 12:00:00

## 2024-03-15 MED ADMIN — furosemide (LASIX) injection 40 mg: 40 mg | INTRAVENOUS | @ 19:00:00 | Stop: 2024-03-15

## 2024-03-15 MED ADMIN — sodium chloride 3 % NEBULIZER solution 4 mL: 4 mL | RESPIRATORY_TRACT | @ 19:00:00

## 2024-03-15 MED ADMIN — ceftolozane-tazobactam (ZERBAXA) 750 mg in sodium chloride (NS) 0.9 % 100 mL IVPB: 750 mg | INTRAVENOUS | @ 19:00:00 | Stop: 2024-03-18

## 2024-03-15 MED ADMIN — carboxymethylcellulose sodium (THERATEARS) 0.25 % ophthalmic solution 2 drop: 2 [drp] | OPHTHALMIC | @ 12:00:00

## 2024-03-15 MED ADMIN — carboxymethylcellulose sodium (THERATEARS) 0.25 % ophthalmic solution 2 drop: 2 [drp] | OPHTHALMIC | @ 01:00:00

## 2024-03-15 MED ADMIN — ipratropium (ATROVENT) 0.02 % nebulizer solution 500 mcg: 500 ug | RESPIRATORY_TRACT | @ 08:00:00

## 2024-03-15 MED ADMIN — insulin regular (HumuLIN,NovoLIN) injection 0-20 Units: 0-20 [IU] | SUBCUTANEOUS | @ 09:00:00

## 2024-03-15 MED ADMIN — fat emulsion 20 % with fish oil (SMOFlipid) infusion 250 mL: 250 mL | INTRAVENOUS | @ 03:00:00 | Stop: 2024-03-15

## 2024-03-15 NOTE — Unmapped (Signed)
 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 If needing diuresis would recommend to aim for net neutral or slightly net negative around 0.5 L. Hopeful that renal function will continue to improve    # SBO s/p ex-lap   - Evaluation and management per primary team  - No changes to management from a nephrology standpoint at this time    RECOMMENDATIONS:   - no acute indication for RRT  - supportive measures  - diuresis as described above  - We will sign-off.     Moss Mc, DO  03/15/2024 3:23 PM     Medical decision-making for 03/15/24  Findings / Data     Patient has: []  acute illness w/systemic sxs  [mod]  []  two or more stable chronic illnesses [mod]  []  one chronic illness with acute exacerbation [mod]  []  acute complicated illness  [mod]  []  Undiagnosed new problem with uncertain prognosis  [mod] [x]  illness posing risk to life or bodily function (ex. 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 Probs At least 2:  Probs, Data, Risk   I reviewed: [x]  primary team note  []  consultant note(s)  []  external records [x]  chemistry results  [x]  CBC results  []  blood gas results  []  Other []  procedure/op note(s)   []  radiology report(s)  []  micro result(s)  []  w/ independent historian(s) Cr improvimg; UOP stable >=3 Data Review (2 of 3)    I independently interpreted: []  Urine Sediment  []  Renal US []  CXR Images  []  CT Images  []  Other []  EKG Tracing N/A Any     I discussed: []  Pathology results w/ QHPs(s) from other specialties  []  Procedural findings w/ QHPs(s) from other specialties []  Imaging w/ QHP(s) from other specialties  [x]  Treatment plan w/ QHP(s) from other specialties Plan discussed with primary team Any     Mgm't requires: []  Prescription drug(s)  [mod]  []  Kidney biopsy  [mod]  []  Central line placement  [mod] [x]  High risk medication use and/or intensive toxicity monitoring [high]  []  Renal replacement therapy [high]  []  High risk kidney biopsy  [high]  []  Escalation of care  [high]  []  High risk central line placement  [high] IV Diuresis: check BMP/Mg  Risk      _____________________________________________________________________________________    Subjective/Interval Events:   - extubated w/o major complaints  - made 930 mL off diuretics    Physical Exam:  Vitals:    03/15/24 1200 03/15/24 1300 03/15/24 1400 03/15/24 1500   BP:       Pulse: 104 113 112 107   Resp: 20 28 22 16    Temp: 36.7 ??C (98.1 ??F)  TempSrc: Axillary      SpO2: 100% 97% 97% 100%   Weight:       Height:         I/O this shift:  In: 30 [I.V.:30]  Out: 1255 [Urine:725; Emesis/NG output:500; Drains:30]    Intake/Output Summary (Last 24 hours) at 03/15/2024 1523  Last data filed at 03/15/2024 1500  Gross per 24 hour   Intake 1149.31 ml   Output 2425 ml   Net -1275.69 ml     Constitutional: ill-appearing; alert, awake  Heart: irregularly irregular  Lungs: no abnormal lung sounds noted though poor inspiratory effort; on HFNC  Ext: 1-2+ edema

## 2024-03-15 NOTE — Unmapped (Signed)
 Right internal jugular vas cath removed following discussion with attending (Dr. Coral Else) and Nephrology. Both teams are confident that no further dialysis will be necessary at this time.     Central line removed via the traditional fashion, patient in a flat position, pressure held for 8 minutes, hemostasis confirmed and occlusive dressing placed.     Discussed with nursing and instructed to remain on flat bedrest for 30 minutes.    No complications noted. No EBL.     Katina Degree, ACNP  [employer], [place]

## 2024-03-15 NOTE — Unmapped (Signed)
 [facility]U PROGRESS NOTE     Date of Service: 03/15/2024    Hospital Day: LOS: 17 days        Surgery Date: TBD   Surgical Attending: Aris Georgia, MD    Critical Care Attending: Lia Hopping, MD    Interval History:   CXR this AM with some LLL collapse, continue airway clearance with bed percussion, IS, metanebs if able. Continues to do well on HFNC with low O2 requirements. Diuresed todayx2 for goal -500cc-1L. Remains in Afib though rate controlled on IV amiodarone. Continue bowel rest with NGT to LIWS and renew TPN. Suppository with some effect, small Bmx2. Adequate UOP via urostomy. 19Fr blake drain (labeled #2) removed today.    History of Present Illness:   Tammy Braun is a 83 y.o. female with PMH bladder cancer with cystectomy and ileal conduit, COPD, recent dx of afib, moderate-severe TR, CAD (high calcification score), T2DM presented to [facility] with projectile vomiting and abdominal pain, found to have SBO with transition point at level of ileostomy on CT A/P. On initial presentation, labs remarkable for leukocytosis thought to be iso hemoconcentration, AKI on CKD, mildly elevated venous lactate improved with resuscitation. Admitted to [facility] for worsening hypoxia on HFNC requiring intubation in setting of hiatal hernia. OR on 02/28/24 for ex-lap, reduciton of parastomal hernia, small bowel resection with primary anastomosis and lysis of adhesions.      Hospital Course:  - 02/26/24: Admitted to [facility], floor status, consult to urology for possible ileal conduit revision.   - 02/27/24: Patient level of care escalated to step-down iso increased oxygen needs while in ED.  - 02/28/24: Upgraded to ICU. Intubated at bed-side. OR for ex-lap, reduction of parastomal hernia, small bowel resection with primary anastomosis, lysis of adhesions, abdomen left open with ABThera in place.   - 03/01/24: OR for attempt at abdominal closure. Left open due to desaturations during closure attempt. ABThera in place.  - 03/03/24: RTOR for abdominal closure  -03/06/24: Vas cath placed, CRRT started   -03/09/24: Extubated to HFNC  -03/10/24: CRRT discontinued   - 03/11/24: Re-intubated for hypoxic respiratory failure 2/2 mucus plug      ASSESSMENT & PLAN:     Neurologic:  - Pain: Oxy PRN, tylenol [facility]    #schizoaffective disorder  - Monthly prolixin injection (given 3/14)    Cardiovascular:  - MAP goal > 65    #HFpEF  #Pulm HTN  - Off pressors  - DC vigileo  - ECHO 3/12- LVEF 70%, moderately dilated RV with moderate systolic dysfunction, severe pulmonary hypertension.    #Hx of HTN  -holding home amlodipine    #Hx of HLD  - atorvastatin    #Afib/flutter w RVR:   - Prior hx of paroxysmal afib. CHADSvasc = 6, no AC PTA per chart review.      > Holding home diltiazem, question efficacy given likely poor absorption  - PO Amio ineffective, continue IV formulation  - heparin gtt    #Pulmonary embolism  - 3/5 CTA chest: Filling defect in L anterior segmental artery  - heparin gtt     #Acute thrombus right basilic   - Identified 3/3 on BUE duplex  - Supportive care, superficial, AC not required     Respiratory:  - HFNC with stable FiO2    #Acute on chronic hypoxemia  #Hx of COPD  - Aggressive pulmonary toilet  - Continue incruse ellipta     #Pseudomonas Pneumonia   #VAP  - see ID  for abx plan    FEN/GI:  F: ML  E: Replete electrolytes PRN  N: NPO, NGT to LIWS   - Renew TPN  - GI prophylaxis: pepcid  - Bowel regimen: Suppository    #S/p ex-lap, reduction of parastomal hernia, small bowel resection with primary anastomosis, lysis of adhesions  -CT 3/13 demonstrating partial small bowel obstruction  -Awaiting return of bowel function  -Blake drain#2 removed.    #Large hiatal hernia, chronic  - re-eval on CT chest/abd/pelvis 3/13 stable    Renal/Genitourinary:  #AKI on CKD  -Nephrology signed off, HD cath removed  - Cr stable    Intake/Output Summary (Last 24 hours) at 03/15/2024 2026  Last data filed at 03/15/2024 2000  Gross per 24 hour   Intake 908.78 ml Output 2790 ml   Net -1881.22 ml        #H/o bladder cancer s/p radical cystectomy with ileal conduit (2012):  #Parastomal hernia:  - Strict I&Os  - Ok for no foley in stoma at this time per urology    Endocrine:   #hyperglycemia   - Glycemic Control: SSI    Hematologic:   - Hgb stable  - Monitor CBC daily, transfuse for hgb 7mg /dL  - DVT ppx: heparin gtt for PE    Immunologic/Infectious Disease:  - Afebrile with resolving leukocytosis     - Cultures:   - Blood cultures (3/4): negative at 5 days   - BAL (3/4): Pseudomonas aeruginosa - susceptible to meropenum    - BAL (3/8): Pseudomonas aeruginosa and candida glabrata  -3/12: Susceptibilities show resistance to Meropenum, Cefepime, Ceftaz and Zosyn.   - Bcx (3/11): NGTD @ 24h  - BAL (3/12): 600,000+ psuedomonas (susceptibilities pending)  - Ucx (3/13): no growth    - Antimicrobials:    - Ceftriaxone 2/27- 2/28 - colonized UTI   - Zosyn (3/1 - 3/5) for abdominal contamination  - Zosyn (3/6) for pseudomonas when sensitivities pending  - Zerbaxa (3/12- ) for 7-10 day course  - ID following, appreciate recs    Musculoskeletal:   #Deconditioned   - PT/OT consulted     Daily Care Checklist:   - Stress Ulcer Prevention: No  - DVT Prophylaxis: Chemical: Heparin drip  - Daily Awakening: Yes  - Spontaneous Breathing Trial: Yes  - Indication for Central/PICC Line: Yes  Infusions requiring central access, Hemodynamic monitoring, and Inadequate peripheral access, RIJ triple lumen  - Indication for Urinary Catheter: Yes  Strict intake and output, Agressive diuresis/hydration, and Urinary retention/obstruction  - Diagnostic images/reports of past 24hrs reviewed: Yes    Disposition:   - Continue ICU care  - PT/OT consulted:yes     SUBJECTIVE:    Alert and appropriate. Follows commands. Denies pain.     OBJECTIVE:     Physical Exam:  Constitutional: Lying supine, NAD  Neurologic: Opens eyes to voice. Moves all 4 extremities to command. Pupils equal and responsive to light. Respiratory: Diminished breath sounds bilaterally. Equal chest rise. HFNC  Cardiovascular: Mildly tachycardic with irregularly irregular rhythm  Gastrointestinal: Soft abdomen, midline incision staples in tact. Urostomy draining clear yellow urine. Surgical drainx1 with SS output  Musculoskeletal: Trace BLE/BUE edema.  Skin: Warm and dry     Temp:  [36.1 ??C (97 ??F)-36.7 ??C (98.1 ??F)] 36.3 ??C (97.4 ??F)  Heart Rate:  [94-127] 102  SpO2 Pulse:  [97-121] 99  Resp:  [14-28] 14  A BP-1: (90-151)/(50-112) 133/65  MAP:  [66 mmHg-149 mmHg] 86 mmHg  FiO2 (%):  [  40 %-45 %] 40 %  SpO2:  [93 %-100 %] 99 %     Recent Laboratory Results:  Recent Labs     Units 03/15/24  0332   PHART  7.33*   PCO2ART mm Hg 40.3   PO2ART mm Hg 72.8*   HCO3ART mmol/L 21*   BEART  -4.2*   O2SATART % 92.3*       Recent Labs     Units 03/14/24  2040 03/15/24  0332 03/15/24  1111   NA mmol/L 139 140 - 139 139   K mmol/L 4.2 4.2 - 3.9 4.0   CL mmol/L 106 105 106   CO2 mmol/L 23.0 21.0 22.0   BUN mg/dL 38* 39* 39*   CREATININE mg/dL 1.61* 0.96* 0.45*   GLU mg/dL 409* 811* 914*     Lab Results   Component Value Date    BILITOT 0.3 03/15/2024    BILITOT 0.4 03/14/2024    BILIDIR 0.20 03/15/2024    BILIDIR 0.20 03/14/2024    ALT 10 03/15/2024    ALT 11 03/14/2024    AST 13 03/15/2024    AST 13 03/14/2024    GGT 16 09/17/2011    GGT 354 (H) 05/03/2011    ALKPHOS 60 03/15/2024    ALKPHOS 58 03/14/2024    PROT 5.7 03/15/2024    PROT 5.4 (L) 03/14/2024    ALBUMIN 1.9 (L) 03/15/2024    ALBUMIN 1.8 (L) 03/14/2024     Recent Labs     Units 03/14/24  2349 03/15/24  0525 03/15/24  1110 03/15/24  1648   POCGLU mg/dL 782 956* 213* 086     Recent Labs     Units 03/13/24  0135 03/14/24  0407 03/15/24  0332   WBC 10*9/L 15.3* 10.9 10.6   RBC 10*12/L 2.73* 2.78* 2.79*   HGB g/dL 7.5* 7.5* 7.8*   HCT % 22.9* 23.5* 23.7*   MCV fL 83.7 84.5 85.0   MCH pg 27.6 26.9 28.1   MCHC g/dL 57.8 46.9* 62.9   RDW % 18.1* 18.0* 18.2*   PLT 10*9/L 222 216 243   MPV fL 9.1 9.5 9.7 Recent Labs     Units 03/15/24  0332   APTT sec 80.6*      Lines & Tubes:   Patient Lines/Drains/Airways Status       Active Peripheral & Central Intravenous Access       Name Placement date Placement time Site Days    Peripheral IV 03/04/24 Anterior;Proximal;Right Forearm 03/04/24  0021  Forearm  11    CVC Triple Lumen 03/07/24 Non-tunneled Left Internal jugular 03/07/24  0849  Internal jugular  8                     Patient Lines/Drains/Airways Status       Active Wounds       Name Placement date Placement time Site Days    Surgical Site 04/08/18 Shoulder Left 04/08/18  1338  -- 2168    Surgical Site 06/05/22 Eye Left 06/05/22  0928  -- 649    Surgical Site 06/19/22 Eye Right 06/19/22  1024  -- 635    Surgical Site 03/03/24 Abdomen 03/03/24  1013  -- 12    Wound 06/24/20 Other (comment) Buttocks inner right buttock 06/24/20  1010  Buttocks  1360    Wound 03/11/24 Other (comment) Sacrum Mid moisture/friction injury 03/11/24  1200  Sacrum  4    Wound 03/12/24 Other (comment) Groin  Right 03/12/24  1500  Groin  3    Wound 03/13/24 Neck Right open area note arount the HD catheter durng the dressing change 03/13/24  0930  Neck  2                     Respiratory/ventilator settings for last 24 hours:   FiO2 (%): 40 %    Intake/Output last 3 shifts:  I/O last 3 completed shifts:  In: 2156.4 [I.V.:680.1; NG/GT:90; IV Piggyback:931.4]  Out: 3355 [Urine:2155; Emesis/NG output:1100; Drains:100]    Daily/Recent Weight:  69.5 kg (153 lb 3.5 oz)    BMI:  Body mass index is 28.97 kg/m??.    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    Chronic kidney disease     Depression, psychotic (CMS-HCC)     Diabetes mellitus (CMS-HCC)     in past    Emphysema of lung (CMS-HCC)     Financial difficulties     Frail elderly     Hearing impairment     Hernia     History of transfusion Hyperlipidemia     Hypertension     Impaired mobility     Osteoporosis     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/08/2015    Visual impairment      Past Surgical History:   Procedure Laterality Date    ABDOMINAL SURGERY      BLADDER SURGERY      BREAST CYST EXCISION      CHEMOTHERAPY  2012    bladder    GALLBLADDER SURGERY      stone removal    ILEOSTOMY  2012    PR COLONOSCOPY FLX DX W/COLLJ SPEC WHEN PFRMD N/A 02/09/2015    Procedure: COLONOSCOPY, FLEXIBLE, PROXIMAL TO SPLENIC FLEXURE; DIAGNOSTIC, W/WO COLLECTION SPECIMEN BY BRUSH OR WASH;  Surgeon: Dewaine Conger, MD;  Location: [facility] [place];  Service: Gastroenterology    PR EXPLORATORY OF ABDOMEN N/A 02/28/2024    Procedure: EXPLORATORY LAPAROTOMY, EXPLORATORY CELIOTOMY WITH OR WITHOUT BIOPSY(S);  Surgeon: Renda Rolls, MD;  Location: [facility];  Service: General Surgery    PR RECONSTR TOTAL SHOULDER IMPLANT Left 04/08/2018    Procedure: ARTHROPLASTY, GLENOHUMERAL JOINT; TOTAL SHOULDER(GLENOID & PROXIMAL HUMERAL REPLACEMENT(EG, TOTAL SHOULDER);  Surgeon: Tomasa Rand, MD;  Location: [facility];  Service: Ortho Sports Medicine    PR REOPEN RECENT ABD EXPLORATORY Midline 03/01/2024    Procedure: REOPENING OF RECENT LAPAROTOMY;  Surgeon: [name]., MD;  Location: OR [facility];  Service: Trauma    PR REOPEN RECENT ABD EXPLORATORY N/A 03/03/2024    Procedure: REOPENING OF RECENT LAPAROTOMY;  Surgeon: Joanie Coddington, MD;  Location: OR [facility];  Service: Trauma    PR SIGMOIDOSCOPY,BIOPSY N/A 03/11/2015    Procedure: SIGMOIDOSCOPY, FLEXIBLE; WITH BIOPSY, SINGLE OR MULTIPLE;  Surgeon: Wilburt Finlay, MD;  [facility];  Service: Gastroenterology    PR XCAPSL CTRC RMVL INSJ IO LENS PROSTH W/O ECP Left 06/05/2022    Procedure: EXTRACAPSULAR CATARACT REMOVAL W/INSERTION OF INTRAOCULAR LENS PROSTHESIS, MANUAL OR MECHANICAL TECHNIQUE WITHOUT ENDOSCOPIC CYCLOPHOTOCOAGULATION;  Surgeon: Garner Gavel, MD;  Location: [facility] OR [facility];  Service: Ophthalmology  PR XCAPSL CTRC RMVL INSJ IO LENS PROSTH W/O ECP Right 06/19/2022    Procedure: EXTRACAPSULAR CATARACT REMOVAL W/INSERTION OF INTRAOCULAR LENS PROSTHESIS, MANUAL OR MECHANICAL TECHNIQUE WITHOUT ENDOSCOPIC CYCLOPHOTOCOAGULATION;  Surgeon: Garner Gavel, MD;  Location: [facility] OR [facility];  Service: Ophthalmology     Scheduled Medications:   amiodarone  150 mg Intravenous daily    aspirin  81 mg Enteral tube: gastric Daily    atorvastatin  40 mg Enteral tube: gastric Daily    budesonide (PULMICORT) nebulizer solution  0.5 mg Nebulization BID (RT)    carboxymethylcellulose sodium  2 drop Both Eyes TID    ceftolozane-tazobactam  750 mg Intravenous Q8H    flu vacc [id number](11yr up)-PF  0.5 mL Intramuscular During hospitalization    fluPHENAZine decanoate  50 mg Intramuscular Q30 Days    [Provider Hold] insulin NPH  5 Units Subcutaneous BID AC    insulin regular  0-20 Units Subcutaneous Q6H SCH    ipratropium  500 mcg Nebulization Q6H (RT)    pantoprazole (Protonix) intravenous solution  40 mg Intravenous Daily    [Provider Hold] polyethylene glycol  17 g Enteral tube: gastric Daily    [Provider Hold] sennosides  2.5 mL Enteral tube: gastric Nightly    sodium chloride  10 mL Intravenous Q8H    sodium chloride  10 mL Intravenous Q8H    sodium chloride  10 mL Intravenous Q8H    sodium chloride  4 mL Nebulization Q6H (RT)     Continuous Infusions:   Parenteral Nutrition (CENTRAL)      And    fat emulsion 20 % with fish oil      heparin 18 Units/kg/hr (03/15/24 1119)    NxStage RFP 400 (+/- BB) 5000 mL - contains 2 mEq/L of potassium      NxStage/multiBic RFP 401 (+/- BB) 5000 mL - contains 4 mEq/L of potassium      Parenteral Nutrition (CENTRAL) 40 mL/hr at 03/15/24 0800     PRN Medications:  albuterol, [Provider Hold] cyclobenzaprine, dextrose in water, heparin (porcine), heparin (porcine), HYDROmorphone, nicotine polacrilex **OR** nicotine polacrilex, ondansetron, oxyCODONE **OR** oxyCODONE    Tammy Braun is critically ill due to acute kidney injury, altered mental status, atrial fibrillation, pneumonia, pulmonary embolism. This critical care time includes examining the patient, evaluating the hemodynamic, laboratory, and radiographic data, independently developing a comprehensive management plan, and serially assessing the patient's response to these critical care interventions. This critical care time excludes procedures.    Critical care time: 60 minutes     Tamsen Snider, PA   Surgical Intensive Care Unit  [place] [facility] [place] [facility]

## 2024-03-15 NOTE — Unmapped (Unsigned)
 [facility]  Pulmonary Clinic - Follow-up Visit    Referring Physician :  Vanetta Shawl  PCP:     Vanetta Shawl, MD  Reason for Consult:   COPD     ASSESSMENT and PLAN     Tammy Braun is a 83 y.o. female who follows in pulmonary clinic for COPD.     Tammy Braun presented today for follow up of her COPD. *** She has evidence of pretty severe COPD per her imaging. Have been unable to get reliable PFTs. Fortunately, she has limited symptoms though these may due to her low mobility due to her other medical conditions. We discussed at length today the treatment of COPD. We discussed that smoking cessation is one of the best things she can do for the health of her lungs. She is uncertain if she is going to be able to quit, but willing to engage in smoking cessation program. Encouraged her to try her best. In addition, discussed immunizations to prevent pulmonary infections. She declined despite extensive counseling and motivational interviewing. Also discussed starting maintenance inhaler today. Given her symptoms, LABA-LAMA would be reasonable given her every other day symptoms. However, she did not want to take a controller inhaler and preferred to continue her PRN albuterol as this is what she felt most comfortable with despite counseling. She would like pulmonary rehab in [place] which we will work to arrange. ***     Plan:   Inhalers:  Continue PRN albuterol  If she becomes amendable, would start LABA-LAMA   Smoking cessation referral placed and smoking cessation line given   Referral for pulmonary rehab placed.   Would encourage her to get vaccinations including covid, flu, pneumonia, RSV     Immunizations: (has declined but ask ***)   - Flu: ***  - Covid: ***   - Pneumonia: ***  - RSV: ***   - Tdap: ***      Plan of care was discussed with the patient who acknowledged understanding and is in agreement.    Patient will follow up in *** months or sooner if needed.      Tammy Braun was seen, examined and discussed with Dr. Marland Kitchen who agrees with the assessment and plan above.     KG:MWNU Benedetto Goad, Vanetta Shawl, MD      Valeda Malm  Pulmonary and Critical Care Fellow PGY4    HISTORY:      History of Present Illness: Tammy Braun is a 83 y.o. female current 1 pack/day smoker (97.5 pack year) with PMHx HTN, PVC, CAD, TR, chart hx of pulm HTN, stroke, COPD, DM, schizoaffective d/o who is seen in consultation at the request of Vanetta Shawl,* for comprehensive evaluation of COPD.     Had been seen in pulmonary HTN clinic in 2021. Was referred at that time given echo c/f pulm HTN. Noted to have COPD, and was taking Spiriva. Was smoking currently at that time. Determined to have group 2 and group 3 in setting of COPD.     She states she had cough and some shortness of breath with exertion for many, many years. Denies any hemoptysis. She current takes albuterol PRN, about every other day, mostly for productive cough. Not on any maintenance inhalers. She denies any wheezing. However, her daughter, who was present in the room said her breathing is very audible when she walks. Her ambulation is limited by her significant OA, so uncertain if SOB would  be limiting. Denies orthopnea and PND. Cough not worse at night, worse during the day. Is currently smoking 1 ppd. Reduced from 1.5 ppd. Has been smoking since she was 83 years old.    Interval History:   Last seen in 09/2023 for initial visit. Today, ***     Review of Systems:  A comprehensive review of systems was completed and negative except as noted in HPI.    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    Chronic kidney disease     Depression, psychotic (CMS-HCC)     Diabetes mellitus (CMS-HCC)     in past    Emphysema of lung (CMS-HCC)     Financial difficulties     Frail elderly     Hearing impairment     Hernia     History of transfusion     Hyperlipidemia     Hypertension     Impaired mobility     Osteoporosis     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/08/2015    Visual impairment        PSHx:  Past Surgical History:   Procedure Laterality Date    ABDOMINAL SURGERY      BLADDER SURGERY      BREAST CYST EXCISION      CHEMOTHERAPY  2012    bladder    GALLBLADDER SURGERY      stone removal    ILEOSTOMY  2012    PR COLONOSCOPY FLX DX W/COLLJ SPEC WHEN PFRMD N/A 02/09/2015    Procedure: COLONOSCOPY, FLEXIBLE, PROXIMAL TO SPLENIC FLEXURE; DIAGNOSTIC, W/WO COLLECTION SPECIMEN BY BRUSH OR WASH;  Surgeon: Dewaine Conger, MD;  Location: [facility] [place];  Service: Gastroenterology    PR EXPLORATORY OF ABDOMEN N/A 02/28/2024    Procedure: EXPLORATORY LAPAROTOMY, EXPLORATORY CELIOTOMY WITH OR WITHOUT BIOPSY(S);  Surgeon: Renda Rolls, MD;  Location[facility];  Service: General Surgery    PR RECONSTR TOTAL SHOULDER IMPLANT Left 04/08/2018    Procedure: ARTHROPLASTY, GLENOHUMERAL JOINT; TOTAL SHOULDER(GLENOID & PROXIMAL HUMERAL REPLACEMENT(EG, TOTAL SHOULDER);  Surgeon: Tomasa Rand, MD;  Location: [facility] OR [facility];  Service: Ortho Sports Medicine    PR REOPEN RECENT ABD EXPLORATORY Midline 03/01/2024    Procedure: REOPENING OF RECENT LAPAROTOMY;  Surgeon: [name]., MD;  Location: OR [facility];  Service: Trauma    PR REOPEN RECENT ABD EXPLORATORY N/A 03/03/2024    Procedure: REOPENING OF RECENT LAPAROTOMY;  Surgeon: Joanie Coddington, MD;  Location: OR [facility];  Service: Trauma    PR SIGMOIDOSCOPY,BIOPSY N/A 03/11/2015    Procedure: SIGMOIDOSCOPY, FLEXIBLE; WITH BIOPSY, SINGLE OR MULTIPLE;  Surgeon: Wilburt Finlay, MD;  Location: [facility];  Service: Gastroenterology    PR XCAPSL CTRC RMVL INSJ IO LENS PROSTH W/O ECP Left 06/05/2022    Procedure: EXTRACAPSULAR CATARACT REMOVAL W/INSERTION OF INTRAOCULAR LENS PROSTHESIS, MANUAL OR MECHANICAL TECHNIQUE WITHOUT ENDOSCOPIC CYCLOPHOTOCOAGULATION;  Surgeon: Garner Gavel, MD;  Location: [facility] OR [facility];  Service: Ophthalmology    PR XCAPSL CTRC RMVL INSJ IO LENS PROSTH W/O ECP Right 06/19/2022    Procedure: EXTRACAPSULAR CATARACT REMOVAL W/INSERTION OF INTRAOCULAR LENS PROSTHESIS, MANUAL OR MECHANICAL TECHNIQUE WITHOUT ENDOSCOPIC CYCLOPHOTOCOAGULATION;  Surgeon: Garner Gavel, MD;  Location: [facility] OR [facility];  Service: Ophthalmology        FHx:  Family History   Problem Relation Age of Onset    Breast cancer Daughter 35    Diabetes Mother     Glaucoma Father     Colon cancer Neg Hx     Ovarian cancer Neg Hx     Endometrial cancer Neg Hx     Anesthesia problems Neg Hx     Bleeding Disorder Neg Hx         Social   Lives with daughter    Smoking (cigarettes, vaping, marijuana): Current smoker, 1 ppd, reduced from 1.5 ppd. Started smoking at 17. 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     Exposures:   - asbestos: None   - silica: None  - ceramics: None    - metals: None   - dusts: None   - molds: None   - hot tub/sauna/flooding: None     Pets: A dog. No birds.     Medications:   Current Outpatient Medications   Medication Instructions    albuterol HFA 90 mcg/actuation inhaler 2 puffs, Inhalation, Every 4 hours PRN    amlodipine (NORVASC) 10 mg, Oral, Daily (standard)    atorvastatin (LIPITOR) 40 mg, Oral, Daily (standard)    blood sugar diagnostic (GLUCOSE BLOOD) Strp Disp test strips preferred by insurance plan. Testing qday, Dx: E11.9 (Type 2 DM- controlled)    blood-glucose meter kit Disp. blood glucose meter kit preferred by patient's insurance. Check blood sugars as directed by provider. Dx: Diabetes, E11.9    candesartan (ATACAND) 16 MG tablet 1 po bid    cyclobenzaprine (FLEXERIL) 5 mg, Oral, 2 times a day PRN    dilTIAZem (CARDIZEM CD) 120 mg, Oral, Daily (standard)    fluPHENAZine decanoate (PROLIXIN) 50 mg, Intramuscular, Every 30 days    furosemide (LASIX) 20 MG tablet TAKE 2 TABLETS BY MOUTH DAILY FOR 5 DAYS, THEN 1 TABLET DAILY    gabapentin (NEURONTIN) 100 mg, Oral, 3 times a day (standard)    inhalational spacing device (E-Z SPACER) Spcr 1 each, Miscellaneous, Every 4 hours PRN    lancets Misc Disp. lancets #30 or amount allowed, Testing once Qday. Dx: E11.9 (Diabetes- controlled)    MEDICAL SUPPLY ITEM Entreal formula nutritionally supplement complete caloric density    MEDICAL SUPPLY ITEM Compression stockings knee high    traMADol (ULTRAM) 50 mg tablet 1 po at bedtime prn pain    trolamine salicylate (ASPERCREME TOP) 1 Application, As needed (once a day)     Allergies: Reviewed.    Other History:  The past medical history, surgical history, social history, family history, medications and allergies were personally reviewed and updated in the patient's electronic medical record.           PHYSICAL EXAM:      Physical Exam:  There were no vitals filed for this visit.    There is no height or weight on file to calculate BMI.    General: well appearing, appears stated age, sitting in wheel chair, Non-toxic. NAD. ***   Eyes: EOMI grossly. Anicteric sclera. Marland Kitchen  Respiratory: CTAB, no wheezes, rales, or rhonchi   Cardiovascular: RRR, +s1/s2, no m/r/g. No LEE.  Musculoskeletal/extremities: moving all extremities   Skin: No skin rashes on clothed exam  Neuro: Alert and oriented x 3. CN grossly intact. No focal neurological deficits grossly        LABORATORY and RADIOLOGY DATA:     Pulmonary Function Tests/Interpretation:     Pulmonary Function Test Results  Date FVC FEV1 FEV1/FVC TLC DLCO Other   07/30/23 Uninterruptable Uninterruptable Uninterruptable Uninterruptable Uninterruptable               : None      Pertinent Laboratory Data:  Reviewed Pertinent Imaging Data:  Images were personally reviewed with attending.     CT Chest w/o Contrat (05/2023):   1. As evident on prior imaging, there is extensive volume in the lung bases associated with mass effect from herniation of a large amount of abdominal contents into the caudal aspect of the mediastinum. However, this study demonstrates new complete lack of aeration of the lower lobe of the left lung which likely combination of atelectasis and consolidation. Associated extensive debris within the segmental airways may relate to infection, retained mucus, or aspiration. I favor the latter. No potentially associated obstructing mass lesion is identified. However, consider CT imaging surveillance in 2 to 3 months to ensure improvement.      2. Notable chronic findings include:   - Calcified atherosclerotic disease within the coronary arterial distribution (heavy)   - Advanced destructive centrilobular and substantial paraseptal emphysema, similar to prior.   -Prominent pulmonary pulmonary trunk (3. 7 cm), which may reflect some element of pulmonary arterial hypertension          Echo (12/2022):    1. The left ventricle is normal in size with mildly increased wall  thickness.    2. The left ventricular systolic function is normal, LVEF is visually  estimated at > 55%.    3. The mitral valve leaflets are mildly thickened with normal leaflet  mobility.    4. Mitral annular calcification is present (moderate).    5. There is mild mitral valve regurgitation.    6. The aortic valve is trileaflet with mildly thickened leaflets with normal  excursion.    7. There is mild aortic regurgitation.    8. The right ventricle is normal in size, with normal systolic function.    9. There is moderate to severe tricuspid regurgitation.    10. There is moderate pulmonary hypertension.    11. There is mild pulmonic regurgitation

## 2024-03-16 LAB — BASIC METABOLIC PANEL
ANION GAP: 14 mmol/L (ref 5–14)
ANION GAP: 11 mmol/L (ref 5–14)
BLOOD UREA NITROGEN: 47 mg/dL — ABNORMAL HIGH (ref 9–23)
BLOOD UREA NITROGEN: 51 mg/dL — ABNORMAL HIGH (ref 9–23)
BUN / CREAT RATIO: 40
BUN / CREAT RATIO: 44
CALCIUM: 8.2 mg/dL — ABNORMAL LOW (ref 8.7–10.4)
CALCIUM: 8.1 mg/dL — ABNORMAL LOW (ref 8.7–10.4)
CHLORIDE: 107 mmol/L (ref 98–107)
CHLORIDE: 111 mmol/L — ABNORMAL HIGH (ref 98–107)
CO2: 22 mmol/L (ref 20.0–31.0)
CO2: 22 mmol/L (ref 20.0–31.0)
CREATININE: 1.17 mg/dL — ABNORMAL HIGH (ref 0.55–1.02)
CREATININE: 1.16 mg/dL — ABNORMAL HIGH (ref 0.55–1.02)
EGFR CKD-EPI (2021) FEMALE: 47 mL/min/{1.73_m2} — ABNORMAL LOW (ref >=60)
EGFR CKD-EPI (2021) FEMALE: 47 mL/min/{1.73_m2} — ABNORMAL LOW (ref >=60)
GLUCOSE RANDOM: 232 mg/dL — ABNORMAL HIGH (ref 70–99)
GLUCOSE RANDOM: 279 mg/dL — ABNORMAL HIGH (ref 70–179)
POTASSIUM: 4 mmol/L (ref 3.4–4.8)
POTASSIUM: 4.1 mmol/L (ref 3.4–4.8)
SODIUM: 143 mmol/L (ref 135–145)
SODIUM: 144 mmol/L (ref 135–145)

## 2024-03-16 LAB — BLOOD GAS CRITICAL CARE PANEL, ARTERIAL
BASE EXCESS ARTERIAL: -3.5 — ABNORMAL LOW (ref -2.0–2.0)
CALCIUM IONIZED ARTERIAL (MG/DL): 5.2 mg/dL (ref 4.40–5.40)
GLUCOSE WHOLE BLOOD: 232 mg/dL — ABNORMAL HIGH (ref 70–179)
HCO3 ARTERIAL: 21 mmol/L — ABNORMAL LOW (ref 22–27)
HEMOGLOBIN BLOOD GAS: 7.5 g/dL — ABNORMAL LOW (ref 12.00–16.00)
LACTATE BLOOD ARTERIAL: 1.2 mmol/L (ref <1.3)
O2 SATURATION ARTERIAL: 95.2 % (ref 94.0–100.0)
PCO2 ARTERIAL: 38.8 mmHg (ref 35.0–45.0)
PH ARTERIAL: 7.36 (ref 7.35–7.45)
PO2 ARTERIAL: 68.3 mmHg — ABNORMAL LOW (ref 80.0–110.0)
POTASSIUM WHOLE BLOOD: 3.9 mmol/L (ref 3.4–4.6)
SODIUM WHOLE BLOOD: 142 mmol/L (ref 135–145)

## 2024-03-16 LAB — CBC
HEMATOCRIT: 23.2 % — ABNORMAL LOW (ref 34.0–44.0)
HEMOGLOBIN: 7.6 g/dL — ABNORMAL LOW (ref 11.3–14.9)
MEAN CORPUSCULAR HEMOGLOBIN CONC: 32.5 g/dL (ref 32.0–36.0)
MEAN CORPUSCULAR HEMOGLOBIN: 27.6 pg (ref 25.9–32.4)
MEAN CORPUSCULAR VOLUME: 85 fL (ref 77.6–95.7)
MEAN PLATELET VOLUME: 9.9 fL (ref 6.8–10.7)
PLATELET COUNT: 240 10*9/L (ref 150–450)
RED BLOOD CELL COUNT: 2.73 10*12/L — ABNORMAL LOW (ref 3.95–5.13)
RED CELL DISTRIBUTION WIDTH: 17.8 % — ABNORMAL HIGH (ref 12.2–15.2)
WBC ADJUSTED: 8.9 10*9/L (ref 3.6–11.2)

## 2024-03-16 LAB — APTT
APTT: 75.6 s — ABNORMAL HIGH (ref 24.8–38.4)
HEPARIN CORRELATION: 0.4

## 2024-03-16 LAB — CYSTATIN C
CYSTATIN C: 2.4 mg/L — ABNORMAL HIGH (ref 0.64–1.23)
EGFR CKD-EPI (2012) CYSTATIN C FEMALE: 21 mL/min/{1.73_m2} — ABNORMAL LOW (ref >=60)

## 2024-03-16 LAB — MAGNESIUM
MAGNESIUM: 1.9 mg/dL (ref 1.6–2.6)
MAGNESIUM: 1.8 mg/dL (ref 1.6–2.6)

## 2024-03-16 LAB — PHOSPHORUS
PHOSPHORUS: 3.7 mg/dL (ref 2.4–5.1)
PHOSPHORUS: 3.6 mg/dL (ref 2.4–5.1)

## 2024-03-16 MED ADMIN — methocarbamol (ROBAXIN) tablet 500 mg: 500 mg | GASTROENTERAL | @ 11:00:00 | Stop: 2024-03-16

## 2024-03-16 MED ADMIN — budesonide (PULMICORT) nebulizer solution 0.5 mg: .5 mg | RESPIRATORY_TRACT | @ 01:00:00

## 2024-03-16 MED ADMIN — budesonide (PULMICORT) nebulizer solution 0.5 mg: .5 mg | RESPIRATORY_TRACT | @ 13:00:00 | Stop: 2024-03-16

## 2024-03-16 MED ADMIN — ceftolozane-tazobactam (ZERBAXA) 750 mg in sodium chloride (NS) 0.9 % 100 mL IVPB: 750 mg | INTRAVENOUS | @ 05:00:00 | Stop: 2024-03-18

## 2024-03-16 MED ADMIN — insulin NPH (HumuLIN,NovoLIN) injection 5 Units: 5 [IU] | SUBCUTANEOUS | @ 22:00:00

## 2024-03-16 MED ADMIN — furosemide (LASIX) injection 40 mg: 40 mg | INTRAVENOUS | @ 19:00:00 | Stop: 2024-03-16

## 2024-03-16 MED ADMIN — sodium chloride (NS) 0.9 % flush 10 mL: 10 mL | INTRAVENOUS | @ 10:00:00

## 2024-03-16 MED ADMIN — furosemide (LASIX) injection 40 mg: 40 mg | INTRAVENOUS | @ 14:00:00 | Stop: 2024-03-16

## 2024-03-16 MED ADMIN — magnesium sulfate 2gm/50mL IVPB: 2 g | INTRAVENOUS | @ 22:00:00 | Stop: 2024-03-16

## 2024-03-16 MED ADMIN — sodium chloride 3 % NEBULIZER solution 4 mL: 4 mL | RESPIRATORY_TRACT | @ 13:00:00

## 2024-03-16 MED ADMIN — insulin NPH (HumuLIN,NovoLIN) injection 5 Units: 5 [IU] | SUBCUTANEOUS | @ 11:00:00

## 2024-03-16 MED ADMIN — sodium chloride (NS) 0.9 % flush 10 mL: 10 mL | INTRAVENOUS | @ 03:00:00

## 2024-03-16 MED ADMIN — insulin regular (HumuLIN,NovoLIN) injection 0-20 Units: 0-20 [IU] | SUBCUTANEOUS | @ 05:00:00

## 2024-03-16 MED ADMIN — acetaminophen (TYLENOL) tablet 1,000 mg: 1000 mg | GASTROENTERAL | @ 19:00:00 | Stop: 2024-03-16

## 2024-03-16 MED ADMIN — fat emulsion 20 % with fish oil (SMOFlipid) infusion 250 mL: 250 mL | INTRAVENOUS | @ 02:00:00 | Stop: 2024-03-16

## 2024-03-16 MED ADMIN — acetaminophen (TYLENOL) tablet 1,000 mg: 1000 mg | GASTROENTERAL | @ 11:00:00 | Stop: 2024-03-16

## 2024-03-16 MED ADMIN — ipratropium (ATROVENT) 0.02 % nebulizer solution 500 mcg: 500 ug | RESPIRATORY_TRACT | @ 09:00:00

## 2024-03-16 MED ADMIN — sodium chloride (NS) 0.9 % flush 10 mL: 10 mL | INTRAVENOUS | @ 19:00:00

## 2024-03-16 MED ADMIN — ceftolozane-tazobactam (ZERBAXA) 750 mg in sodium chloride (NS) 0.9 % 100 mL IVPB: 750 mg | INTRAVENOUS | @ 20:00:00 | Stop: 2024-03-18

## 2024-03-16 MED ADMIN — ceftolozane-tazobactam (ZERBAXA) 750 mg in sodium chloride (NS) 0.9 % 100 mL IVPB: 750 mg | INTRAVENOUS | @ 12:00:00 | Stop: 2024-03-18

## 2024-03-16 MED ADMIN — HYDROmorphone (PF) (DILAUDID) injection Syrg 0.5 mg: .5 mg | INTRAVENOUS | @ 05:00:00 | Stop: 2024-03-16

## 2024-03-16 MED ADMIN — insulin regular (HumuLIN,NovoLIN) injection 0-20 Units: 0-20 [IU] | SUBCUTANEOUS | @ 16:00:00

## 2024-03-16 MED ADMIN — sodium chloride 3 % NEBULIZER solution 4 mL: 4 mL | RESPIRATORY_TRACT | @ 01:00:00

## 2024-03-16 MED ADMIN — aspirin chewable tablet 81 mg: 81 mg | GASTROENTERAL | @ 12:00:00

## 2024-03-16 MED ADMIN — sodium chloride 3 % NEBULIZER solution 4 mL: 4 mL | RESPIRATORY_TRACT | @ 21:00:00

## 2024-03-16 MED ADMIN — methocarbamol (ROBAXIN) tablet 500 mg: 500 mg | GASTROENTERAL | @ 19:00:00 | Stop: 2024-03-16

## 2024-03-16 MED ADMIN — ipratropium (ATROVENT) 0.02 % nebulizer solution 500 mcg: 500 ug | RESPIRATORY_TRACT | @ 21:00:00

## 2024-03-16 MED ADMIN — atorvastatin (LIPITOR) tablet 40 mg: 40 mg | GASTROENTERAL | @ 12:00:00

## 2024-03-16 MED ADMIN — insulin regular (HumuLIN,NovoLIN) injection 0-20 Units: 0-20 [IU] | SUBCUTANEOUS | @ 22:00:00

## 2024-03-16 MED ADMIN — ipratropium (ATROVENT) 0.02 % nebulizer solution 500 mcg: 500 ug | RESPIRATORY_TRACT | @ 01:00:00

## 2024-03-16 MED ADMIN — Parenteral Nutrition (CENTRAL): INTRAVENOUS | @ 02:00:00 | Stop: 2024-03-16

## 2024-03-16 MED ADMIN — ipratropium (ATROVENT) 0.02 % nebulizer solution 500 mcg: 500 ug | RESPIRATORY_TRACT | @ 13:00:00

## 2024-03-16 MED ADMIN — arformoterol (BROVANA) nebulizer solution 15 mcg/2 mL: 15 ug | RESPIRATORY_TRACT | @ 13:00:00

## 2024-03-16 MED ADMIN — heparin 25,000 Units/250 mL (100 units/mL) in 0.45% saline infusion (premade): 0-24 [IU]/kg/h | INTRAVENOUS | @ 15:00:00

## 2024-03-16 MED ADMIN — carboxymethylcellulose sodium (THERATEARS) 0.25 % ophthalmic solution 2 drop: 2 [drp] | OPHTHALMIC

## 2024-03-16 MED ADMIN — insulin regular (HumuLIN,NovoLIN) injection 0-20 Units: 0-20 [IU] | SUBCUTANEOUS | @ 10:00:00

## 2024-03-16 MED ADMIN — amiodarone in dextrose,iso-osm (NEXTERONE) 150 mg/100 mL (1.5 mg/mL) IVPB 150 mg: 150 mg | INTRAVENOUS | @ 10:00:00 | Stop: 2024-03-16

## 2024-03-16 MED ADMIN — pantoprazole (Protonix) injection 40 mg: 40 mg | INTRAVENOUS | @ 12:00:00

## 2024-03-16 NOTE — Unmapped (Signed)
 [facility] PROGRESS NOTE     Date of Service: 03/16/2024    Hospital Day: LOS: 18 days        Surgery Date: TBD   Surgical Attending: Aris Georgia, MD    Critical Care Attending: Lia Hopping, MD    Interval History:   Continue airway clearance with bed percussion, IS, ambulate/PT as tolerated. Continues to do well on HFNC with decreasing O2 requirements. Diuresed todayx2 for goal -1 to 1.5L. Oxycodone and HM taken off the MAR at daughter's request. Schedule tylenol and robaxin for pain. Restarting TF at trickle and advancing as tolerated following 2 small BM's yesterday. Start bowel regimen. Plan to removed second JP drain today from the subcutaneous.      History of Present Illness:   Tammy Braun is a 83 y.o. female with PMH bladder cancer with cystectomy and ileal conduit, COPD, recent dx of afib, moderate-severe TR, CAD (high calcification score), T2DM presented to [facility] with projectile vomiting and abdominal pain, found to have SBO with transition point at level of ileostomy on CT A/P. On initial presentation, labs remarkable for leukocytosis thought to be iso hemoconcentration, AKI on CKD, mildly elevated venous lactate improved with resuscitation. Admitted to [facility] for worsening hypoxia on HFNC requiring intubation in setting of hiatal hernia. OR on 02/28/24 for ex-lap, reduciton of parastomal hernia, small bowel resection with primary anastomosis and lysis of adhesions.      Hospital Course:  - 02/26/24: Admitted to [facility], floor status, consult to urology for possible ileal conduit revision.   - 02/27/24: Patient level of care escalated to step-down iso increased oxygen needs while in ED.  - 02/28/24: Upgraded to ICU. Intubated at bed-side. OR for ex-lap, reduction of parastomal hernia, small bowel resection with primary anastomosis, lysis of adhesions, abdomen left open with ABThera in place.   - 03/01/24: OR for attempt at abdominal closure. Left open due to desaturations during closure attempt. ABThera in place.  - 03/03/24: RTOR for abdominal closure  -03/06/24: Vas cath placed, CRRT started   -03/09/24: Extubated to HFNC  -03/10/24: CRRT discontinued   - 03/11/24: Re-intubated for hypoxic respiratory failure 2/2 mucus plug   - 03/14/24 Extubated to HFNC   -03/15/24: Vas cath removed      ASSESSMENT & PLAN:     Neurologic:  - Pain: tylenol and robaxin SCH  - Discontinue PRN oxy and HM at family request    #schizoaffective disorder  - Monthly prolixin injection (given 3/14)    Cardiovascular:  - MAP goal > 65    #HFpEF  #Pulm HTN  - Off pressors  - ECHO 3/12- LVEF 70%, moderately dilated RV with moderate systolic dysfunction, severe pulmonary hypertension.    #Hx of HTN  -holding home amlodipine    #Hx of HLD  - atorvastatin    #Afib/flutter w RVR:   - Prior hx of paroxysmal afib. CHADSvasc = 6, no AC PTA per chart review.      > Holding home diltiazem, question efficacy given likely poor absorption  - PO Amio ineffective, continue IV formulation  - heparin gtt    #Pulmonary embolism  - 3/5 CTA chest: Filling defect in L anterior segmental artery  - heparin gtt     #Acute thrombus right basilic   - Identified 3/3 on BUE duplex  - Supportive care, superficial, AC not required     Respiratory:  - HFNC with stable FiO2; wean as tolerated     #Acute on chronic hypoxemia  #  Hx of COPD  - Aggressive pulmonary toilet  - Start Brovana, continue atrovent nebs    #Pseudomonas Pneumonia   #VAP  - see ID for abx plan    FEN/GI:  F: ML  E: Replete electrolytes PRN  N: NPO, restart trickle TF 3/17 - advance as tolerated    - Renew TPN   - SLP consult on 3/17 - Ok for PO diet, start full liquids for now  - GI prophylaxis: pepcid  - Bowel regimen: start miralax and senna   - Last BM: 3/16    #S/p ex-lap, reduction of parastomal hernia, small bowel resection with primary anastomosis, lysis of adhesions  -CT 3/13 demonstrating partial small bowel obstruction  -Blake drain#2 removed 3/16  - Blake drain #1 with (25/13) SS output.     #Large hiatal hernia, chronic  - re-eval on CT chest/abd/pelvis 3/13 stable    Renal/Genitourinary:  #AKI on CKD  -Nephrology signed off, HD cath removed  - Cr stable, cystatin c remains elevated (~1 higher than Scr)  - Diurese with IV lasix 40 mg q6h x2 doses, goal for net negative 1 to 1.5L    Intake/Output Summary (Last 24 hours) at 03/16/2024 0622  Last data filed at 03/16/2024 0000  Gross per 24 hour   Intake 876.21 ml   Output 2323 ml   Net -1446.79 ml        #H/o bladder cancer s/p radical cystectomy with ileal conduit (2012):  #Parastomal hernia:  - Strict I&Os  - Ok for no foley in stoma at this time per urology    Endocrine:   #hyperglycemia   - Glycemic Control: SSI, NPH 5 unit BID    Hematologic:   - Hgb stable  - Monitor CBC daily, transfuse for hgb 7mg /dL  - DVT ppx: heparin gtt for PE    Immunologic/Infectious Disease:  - Afebrile with resolving leukocytosis     - Cultures:   - Blood cultures (3/4): negative, final   - BAL (3/4): Pseudomonas aeruginosa - susceptible to meropenum    - BAL (3/8): Pseudomonas aeruginosa and candida glabrata  -3/12: Susceptibilities show resistance to Meropenum, Cefepime, Ceftaz and Zosyn.   - Bcx (3/11): Negative, final  - BAL (3/12): 600,000+ psuedomonas (susceptibilities: resistant to meropenum)  - Ucx (3/13): no growth    - Antimicrobials:    - Ceftriaxone 2/27- 2/28 - colonized UTI   - Zosyn (3/1 - 3/5) for abdominal contamination  - Zosyn (3/6) for pseudomonas when sensitivities remained pending  - Zerbaxa (3/12- ) for 7-10 day course, pending clinical course  - ID following, appreciate recs    Musculoskeletal:   #Deconditioned   - PT/OT consulted     Daily Care Checklist:   - Stress Ulcer Prevention: No  - DVT Prophylaxis: Chemical: Heparin drip  - Daily Awakening: Yes  - Spontaneous Breathing Trial: Yes  - Indication for Central/PICC Line: Yes  Infusions requiring central access, Hemodynamic monitoring, and Inadequate peripheral access, RIJ triple lumen  - Indication for Urinary Catheter: Yes  Strict intake and output, Agressive diuresis/hydration, and Urinary retention/obstruction  - Diagnostic images/reports of past 24hrs reviewed: Yes    Disposition:   - Continue ICU care  - PT/OT consulted:yes     SUBJECTIVE:    Alert and appropriate. Follows commands. Denies pain. Asking for ice chips and to not take heart medications anymore.      OBJECTIVE:     Physical Exam:  Constitutional: Lying supine, NAD  Neurologic: Opens eyes  to voice. Converses appropriately. Moves all 4 extremities to command. Pupils equal and responsive to light.   Respiratory: Diminished breath sounds bilaterally. Equal chest rise. HFNC  Cardiovascular: Mildly tachycardic with irregularly irregular rhythm  Gastrointestinal: Soft abdomen, midline incision staples in tact. Urostomy draining clear yellow urine. Surgical drainx1 with scant SS output  Musculoskeletal: Trace BLE/BUE edema.  Skin: Warm and dry     Temp:  [36.1 ??C (97 ??F)-36.7 ??C (98.1 ??F)] 36.4 ??C (97.6 ??F)  Heart Rate:  [94-123] 110  SpO2 Pulse:  [97-126] 110  Resp:  [14-28] 14  A BP-1: (90-151)/(50-112) 116/64  MAP:  [66 mmHg-149 mmHg] 81 mmHg  FiO2 (%):  [30 %-45 %] 30 %  SpO2:  [94 %-100 %] 100 %     Recent Laboratory Results:  Recent Labs     Units 03/16/24  0424   PHART  7.36   PCO2ART mm Hg 38.8   PO2ART mm Hg 68.3*   HCO3ART mmol/L 21*   BEART  -3.5*   O2SATART % 95.2       Recent Labs     Units 03/15/24  1111 03/15/24  2020 03/16/24  0424   NA mmol/L 139 141 143 - 142   K mmol/L 4.0 4.0 4.0 - 3.9   CL mmol/L 106 106 107   CO2 mmol/L 22.0 23.0 22.0   BUN mg/dL 39* 44* 47*   CREATININE mg/dL 1.61* 0.96* 0.45*   GLU mg/dL 409* 811* 914*     Lab Results   Component Value Date    BILITOT 0.3 03/15/2024    BILITOT 0.4 03/14/2024    BILIDIR 0.20 03/15/2024    BILIDIR 0.20 03/14/2024    ALT 10 03/15/2024    ALT 11 03/14/2024    AST 13 03/15/2024    AST 13 03/14/2024    GGT 16 09/17/2011    GGT 354 (H) 05/03/2011    ALKPHOS 60 03/15/2024    ALKPHOS 58 03/14/2024    PROT 5.7 03/15/2024    PROT 5.4 (L) 03/14/2024    ALBUMIN 1.9 (L) 03/15/2024    ALBUMIN 1.8 (L) 03/14/2024     Recent Labs     Units 03/15/24  1110 03/15/24  1648 03/16/24  0039 03/16/24  0554   POCGLU mg/dL 782* 956 213* 086*     Recent Labs     Units 03/14/24  0407 03/15/24  0332 03/16/24  0424   WBC 10*9/L 10.9 10.6 8.9   RBC 10*12/L 2.78* 2.79* 2.73*   HGB g/dL 7.5* 7.8* 7.6*   HCT % 23.5* 23.7* 23.2*   MCV fL 84.5 85.0 85.0   MCH pg 26.9 28.1 27.6   MCHC g/dL 57.8* 46.9 62.9   RDW % 18.0* 18.2* 17.8*   PLT 10*9/L 216 243 240   MPV fL 9.5 9.7 9.9     Recent Labs     Units 03/16/24  0424   APTT sec 75.6*      Lines & Tubes:   Patient Lines/Drains/Airways Status       Active Peripheral & Central Intravenous Access       Name Placement date Placement time Site Days    Peripheral IV 03/04/24 Anterior;Proximal;Right Forearm 03/04/24  0021  Forearm  12    CVC Triple Lumen 03/07/24 Non-tunneled Left Internal jugular 03/07/24  0849  Internal jugular  8                     Patient Lines/Drains/Airways Status  Active Wounds       Name Placement date Placement time Site Days    Surgical Site 04/08/18 Shoulder Left 04/08/18  1338  -- 2168    Surgical Site 06/05/22 Eye Left 06/05/22  0928  -- 649    Surgical Site 06/19/22 Eye Right 06/19/22  1024  -- 635    Surgical Site 03/03/24 Abdomen 03/03/24  1013  -- 12    Wound 06/24/20 Other (comment) Buttocks inner right buttock 06/24/20  1010  Buttocks  1360    Wound 03/11/24 Other (comment) Sacrum Mid moisture/friction injury 03/11/24  1200  Sacrum  4    Wound 03/12/24 Other (comment) Groin Right 03/12/24  1500  Groin  3    Wound 03/13/24 Neck Right open area note arount the HD catheter durng the dressing change 03/13/24  0930  Neck  2                     Respiratory/ventilator settings for last 24 hours:   FiO2 (%): 30 %    Intake/Output last 3 shifts:  I/O last 3 completed shifts:  In: 2156.4 [I.V.:680.1; NG/GT:90; IV Piggyback:931.4]  Out: 3355 [Urine:2155; Emesis/NG output:1100; Drains:100]    Daily/Recent Weight:  69.5 kg (153 lb 3.5 oz)    BMI:  Body mass index is 28.97 kg/m??.    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    Chronic kidney disease     Depression, psychotic (CMS-HCC)     Diabetes mellitus (CMS-HCC)     in past    Emphysema of lung (CMS-HCC)     Financial difficulties     Frail elderly     Hearing impairment     Hernia     History of transfusion     Hyperlipidemia     Hypertension     Impaired mobility     Osteoporosis     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/08/2015    Visual impairment      Past Surgical History:   Procedure Laterality Date    ABDOMINAL SURGERY      BLADDER SURGERY      BREAST CYST EXCISION      CHEMOTHERAPY  2012    bladder    GALLBLADDER SURGERY      stone removal    ILEOSTOMY  2012    PR COLONOSCOPY FLX DX W/COLLJ SPEC WHEN PFRMD N/A 02/09/2015    Procedure: COLONOSCOPY, FLEXIBLE, PROXIMAL TO SPLENIC FLEXURE; DIAGNOSTIC, W/WO COLLECTION SPECIMEN BY BRUSH OR WASH;  Surgeon: Dewaine Conger, MD;  Location: [facility] [place];  Service: Gastroenterology    PR EXPLORATORY OF ABDOMEN N/A 02/28/2024    Procedure: EXPLORATORY LAPAROTOMY, EXPLORATORY CELIOTOMY WITH OR WITHOUT BIOPSY(S);  Surgeon: Renda Rolls, MD;  Location: [facility];  Service: General Surgery    PR RECONSTR TOTAL SHOULDER IMPLANT Left 04/08/2018    Procedure: ARTHROPLASTY, GLENOHUMERAL JOINT; TOTAL SHOULDER(GLENOID & PROXIMAL HUMERAL REPLACEMENT(EG, TOTAL SHOULDER);  Surgeon: Tomasa Rand, MD;  Location: [facility];  Service: Ortho Sports Medicine    PR REOPEN RECENT ABD EXPLORATORY Midline 03/01/2024    Procedure: REOPENING OF RECENT LAPAROTOMY;  Surgeon: [name]., MD;  Location: OR [facility];  Service:  Trauma    PR REOPEN RECENT ABD EXPLORATORY N/A 03/03/2024 Procedure: REOPENING OF RECENT LAPAROTOMY;  Surgeon: Joanie Coddington, MD;  Location: OR [facility];  Service: Trauma    PR SIGMOIDOSCOPY,BIOPSY N/A 03/11/2015    Procedure: SIGMOIDOSCOPY, FLEXIBLE; WITH BIOPSY, SINGLE OR MULTIPLE;  Surgeon: Wilburt Finlay, MD;  [facility];  Service: Gastroenterology    PR XCAPSL CTRC RMVL INSJ IO LENS PROSTH W/O ECP Left 06/05/2022    Procedure: EXTRACAPSULAR CATARACT REMOVAL W/INSERTION OF INTRAOCULAR LENS PROSTHESIS, MANUAL OR MECHANICAL TECHNIQUE WITHOUT ENDOSCOPIC CYCLOPHOTOCOAGULATION;  Surgeon: Garner Gavel, MD;  Location: [facility] OR [facility];  Service: Ophthalmology    PR XCAPSL CTRC RMVL INSJ IO LENS PROSTH W/O ECP Right 06/19/2022    Procedure: EXTRACAPSULAR CATARACT REMOVAL W/INSERTION OF INTRAOCULAR LENS PROSTHESIS, MANUAL OR MECHANICAL TECHNIQUE WITHOUT ENDOSCOPIC CYCLOPHOTOCOAGULATION;  Surgeon: Garner Gavel, MD;  Location: [facility] OR [facility];  Service: Ophthalmology     Scheduled Medications:   amiodarone  150 mg Intravenous daily    aspirin  81 mg Enteral tube: gastric Daily    atorvastatin  40 mg Enteral tube: gastric Daily    budesonide (PULMICORT) nebulizer solution  0.5 mg Nebulization BID (RT)    carboxymethylcellulose sodium  2 drop Both Eyes TID    ceftolozane-tazobactam  750 mg Intravenous Q8H    flu vacc [id number](64yr up)-PF  0.5 mL Intramuscular During hospitalization    fluPHENAZine decanoate  50 mg Intramuscular Q30 Days    insulin NPH  5 Units Subcutaneous BID AC    insulin regular  0-20 Units Subcutaneous Q6H SCH    ipratropium  500 mcg Nebulization Q6H (RT)    pantoprazole (Protonix) intravenous solution  40 mg Intravenous Daily    [Provider Hold] polyethylene glycol  17 g Enteral tube: gastric Daily    [Provider Hold] sennosides  2.5 mL Enteral tube: gastric Nightly    sodium chloride  10 mL Intravenous Q8H    sodium chloride  10 mL Intravenous Q8H    sodium chloride  10 mL Intravenous Q8H sodium chloride  4 mL Nebulization Q6H (RT)     Continuous Infusions:   Parenteral Nutrition (CENTRAL) 35 mL/hr at 03/15/24 2223    And    fat emulsion 20 % with fish oil 250 mL (03/15/24 2223)    heparin 18 Units/kg/hr (03/15/24 1119)    NxStage RFP 400 (+/- BB) 5000 mL - contains 2 mEq/L of potassium      NxStage/multiBic RFP 401 (+/- BB) 5000 mL - contains 4 mEq/L of potassium       PRN Medications:  albuterol, [Provider Hold] cyclobenzaprine, dextrose in water, heparin (porcine), heparin (porcine), HYDROmorphone, nicotine polacrilex **OR** nicotine polacrilex, ondansetron, oxyCODONE **OR** oxyCODONE    Yaneth Fairbairn is critically ill due to acute kidney injury, altered mental status, atrial fibrillation, pneumonia, pulmonary embolism. This critical care time includes examining the patient, evaluating the hemodynamic, laboratory, and radiographic data, independently developing a comprehensive management plan, and serially assessing the patient's response to these critical care interventions. This critical care time excludes procedures.    Critical care time: 60 minutes     Katina Degree, ACNP   Surgical Intensive Care Unit  [facility] [place] [facility]

## 2024-03-16 NOTE — Unmapped (Signed)
 Problem: Adult Inpatient Plan of Care  Goal: Plan of Care Review  Outcome: Ongoing - Unchanged  Goal: Patient-Specific Goal (Individualized)  Outcome: Ongoing - Unchanged  Goal: Absence of Hospital-Acquired Illness or Injury  Outcome: Ongoing - Unchanged  Intervention: Identify and Manage Fall Risk  Recent Flowsheet Documentation  Taken 03/15/2024 2000 by Tawni Millers, RN  Safety Interventions:   fall reduction program maintained   low bed  Intervention: Prevent Skin Injury  Recent Flowsheet Documentation  Taken 03/15/2024 2000 by Tawni Millers, RN  Positioning for Skin: Right  Intervention: Prevent Infection  Recent Flowsheet Documentation  Taken 03/15/2024 2000 by Tawni Millers, RN  Infection Prevention: rest/sleep promoted  Goal: Optimal Comfort and Wellbeing  Outcome: Ongoing - Unchanged  Goal: Readiness for Transition of Care  Outcome: Ongoing - Unchanged  Goal: Rounds/Family Conference  Outcome: Ongoing - Unchanged     Problem: Infection  Goal: Absence of Infection Signs and Symptoms  Outcome: Ongoing - Unchanged  Intervention: Prevent or Manage Infection  Recent Flowsheet Documentation  Taken 03/15/2024 2000 by Tawni Millers, RN  Infection Management: aseptic technique maintained     Problem: Fall Injury Risk  Goal: Absence of Fall and Fall-Related Injury  Outcome: Ongoing - Unchanged  Intervention: Promote Injury-Free Environment  Recent Flowsheet Documentation  Taken 03/15/2024 2000 by Tawni Millers, RN  Safety Interventions:   fall reduction program maintained   low bed     Problem: Wound  Goal: Optimal Coping  Outcome: Ongoing - Unchanged  Goal: Optimal Functional Ability  Outcome: Ongoing - Unchanged  Intervention: Optimize Functional Ability  Recent Flowsheet Documentation  Taken 03/15/2024 2000 by Tawni Millers, RN  Activity Management: bedrest  Goal: Absence of Infection Signs and Symptoms  Outcome: Ongoing - Unchanged  Intervention: Prevent or Manage Infection  Recent Flowsheet Documentation  Taken 03/15/2024 2000 by Tawni Millers, RN  Infection Management: aseptic technique maintained  Goal: Improved Oral Intake  Outcome: Ongoing - Unchanged  Goal: Optimal Pain Control and Function  Outcome: Ongoing - Unchanged  Goal: Skin Health and Integrity  Outcome: Ongoing - Unchanged  Intervention: Optimize Skin Protection  Recent Flowsheet Documentation  Taken 03/15/2024 2000 by Tawni Millers, RN  Activity Management: bedrest  Goal: Optimal Wound Healing  Outcome: Ongoing - Unchanged     Problem: Skin Injury Risk Increased  Goal: Skin Health and Integrity  Outcome: Ongoing - Unchanged  Intervention: Optimize Skin Protection  Recent Flowsheet Documentation  Taken 03/15/2024 2000 by Tawni Millers, RN  Activity Management: bedrest     Problem: Self-Care Deficit  Goal: Improved Ability to Complete Activities of Daily Living  Outcome: Ongoing - Unchanged     Problem: Artificial Airway  Goal: Effective Communication  Outcome: Ongoing - Unchanged  Goal: Optimal Device Function  Outcome: Ongoing - Unchanged  Intervention: Optimize Device Care and Function  Recent Flowsheet Documentation  Taken 03/15/2024 2000 by Tawni Millers, RN  Aspiration Precautions: respiratory status monitored  Oral Care: mouth swabbed  Goal: Absence of Device-Related Skin or Tissue Injury  Outcome: Ongoing - Unchanged     Problem: Malnutrition  Goal: Improved Nutritional Intake  Outcome: Ongoing - Unchanged     Problem: Mechanical Ventilation Invasive  Goal: Effective Communication  Outcome: Ongoing - Unchanged  Goal: Optimal Device Function  Outcome: Ongoing - Unchanged  Intervention: Optimize Device Care and Function  Recent Flowsheet Documentation  Taken 03/15/2024 2000 by Tawni Millers, RN  Oral Care: mouth swabbed  Goal: Mechanical Ventilation Liberation  Outcome: Ongoing - Unchanged  Goal: Optimal Nutrition Delivery  Outcome: Ongoing - Unchanged  Goal: Absence of Device-Related Skin and Tissue Injury  Outcome: Ongoing - Unchanged  Goal: Absence of Ventilator-Induced Lung Injury  Outcome: Ongoing - Unchanged  Intervention: Prevent Ventilator-Associated Pneumonia  Recent Flowsheet Documentation  Taken 03/15/2024 2000 by Tawni Millers, RN  Oral Care: mouth swabbed

## 2024-03-16 NOTE — Unmapped (Signed)
 Adult Nutrition Progress Note    Visit Type: Follow-Up  Reason for Visit: Enteral Nutrition, Parenteral Nutrition    NUTRITION INTERVENTIONS and RECOMMENDATION     2-in-1 PN to avoid over feeding w start of EN  Tube feeds:Recommend Vital High Protein at goal rate 75 mL/hr. This provides 1575 kcals, 138 g protein, 175 g carbohydrate, 37 g fat, 0 g fiber, 1317 mL free water, and meets 105% USRDI.  Diet as able    NUTRITION ASSESSMENT     Appropriate for at least supplemental PN until can consistently tolerate EN    NUTRITIONALLY RELEVANT DATA     HPI & PMH:   78 YOF N/V and abdominal distention 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 On takeback on 3/2, Urology interrogated stoma with no signs of leak. Catheter placed in stoma. Unable to close patient 2/2 persistent small bowel dilation and patient instability.     Nutrition Progress:   3/17: Restarted TPN over the weekend for dysmotility. Trying TFs once more. SLP cleared pt for a diet as well.     Medications:  Nutritionally pertinent medications reviewed and evaluated for potential food and/or medication interactions.     Labs:   Nutritionally pertinent labs reviewed.   Lab Results   Component Value Date    NA 143 03/16/2024    NA 142 03/16/2024    K 4.0 03/16/2024    K 3.9 03/16/2024    CL 107 03/16/2024    CO2 22.0 03/16/2024    BUN 47 (H) 03/16/2024    CREATININE 1.17 (H) 03/16/2024    GFR >= 60 01/20/2012    GLU 232 (H) 03/16/2024    CALCIUM 8.2 (L) 03/16/2024    ALBUMIN 1.9 (L) 03/15/2024    PHOS 3.7 03/16/2024      Recent Labs     Units 03/16/24  0424   MG mg/dL 1.9       Lab Results   Component Value Date    ALKPHOS 60 03/15/2024    BILITOT 0.3 03/15/2024    BILIDIR 0.20 03/15/2024    PROT 5.7 03/15/2024    ALBUMIN 1.9 (L) 03/15/2024    ALT 10 03/15/2024    AST 13 03/15/2024      Recent Labs     Units 03/15/24  0332   TRIG mg/dL 161 Nutritional Needs:   Daily Estimated Nutrient Needs:  Energy: 1525  1830 kcals 25-30 kcal/kg using estimated dry weight, 61 kg (03/02/24 1026)]  Protein: 90-120 gm [1.5-2.0 gm/kg using estimated dry weight, 61 kg (03/02/24 1026)]      Anthropometric Data:  Height: 154.9 cm (5' 0.98)   Admission weight: 65.1 kg (143 lb 8.3 oz)  Last recorded weight: 72 kg (158 lb 11.7 oz)  Date of last recorded weight: 3/11  IBW: 47.68 kg  BMI: Body mass index is 30.01 kg/m??.   Usual Body Weight: est 61kg  Weight Assessment: fluid/bed wt gain    Malnutrition Assessment:  Malnutrition Assessment using AND/ASPEN or GLIM Clinical Characteristics:    Severe Protein-Calorie Malnutrition in the context of acute illness or injury (03/10/24 0901)  Fat Loss: Moderate  Muscle Loss: Severe  Fluid Accumulation: Moderate  Malnutrition Score: 3            Nutrition Focused Physical Exam:  Nutrition Focused Physical Exam:  Fat Areas Examined  Orbital: Mild loss  Muscle Areas Examined  Temple: Moderate loss  Clavicle: Severe loss  Acromion: Severe loss    Fluid Accumulation/Edema  Lower Extremities: 3+ pitting edema               Care plan:  Completed    Current Nutrition:  Enteral nutrition via NG tube  PN Via non tunnelled CVC triple lumen placed 3/8  Nutrition Orders            Parenteral Nutrition (CENTRAL) at 35 mL/hr starting at 03/17 2200    Nutrition Therapy Full Liquid starting at 03/17 1137    Vital High Protein (Critical Care Low Cal/HP) continuous tube feed starting at 03/17 0900    Parenteral Nutrition (CENTRAL) at 35 mL/hr starting at 03/16 2200            Nutritionally Pertinent Allergies, Intolerances, Sensitivities, and/or Cultural/Religious Restrictions:  none identified at this time     GOALS and EVALUATION     Pt to meet at least 80% nutrition needs via parenteral nutrition on average each day NEW  Patient to meet at least 80% of nutrition needs via tube feeds on average each day-NEW      Motivation, Barriers, and Compliance:  Evaluation of motivation, barriers, and compliance pending at this time due to clinical status.     Discharge Planning:   Monitor for potential discharge needs with multi-disciplinary team.          Follow-Up Parameters:   Monitor daily while on TPN    Arcola Jansky MS, RD, LDN, CNSC

## 2024-03-16 NOTE — Unmapped (Signed)
 Problem: Adult Inpatient Plan of Care  Goal: Plan of Care Review  03/16/2024 0651 by Tawni Millers, RN  Outcome: Ongoing - Unchanged  03/16/2024 0650 by Tawni Millers, RN  Outcome: Ongoing - Unchanged  Goal: Patient-Specific Goal (Individualized)  03/16/2024 0651 by Tawni Millers, RN  Outcome: Ongoing - Unchanged  03/16/2024 0650 by Tawni Millers, RN  Outcome: Ongoing - Unchanged  Goal: Absence of Hospital-Acquired Illness or Injury  03/16/2024 0651 by Tawni Millers, RN  Outcome: Ongoing - Unchanged  03/16/2024 0650 by Tawni Millers, RN  Outcome: Ongoing - Unchanged  Intervention: Identify and Manage Fall Risk  Recent Flowsheet Documentation  Taken 03/15/2024 2000 by Tawni Millers, RN  Safety Interventions:   fall reduction program maintained   low bed  Intervention: Prevent Skin Injury  Recent Flowsheet Documentation  Taken 03/15/2024 2000 by Tawni Millers, RN  Positioning for Skin: Right  Intervention: Prevent Infection  Recent Flowsheet Documentation  Taken 03/15/2024 2000 by Tawni Millers, RN  Infection Prevention: rest/sleep promoted  Goal: Optimal Comfort and Wellbeing  03/16/2024 0651 by Tawni Millers, RN  Outcome: Ongoing - Unchanged  03/16/2024 0650 by Tawni Millers, RN  Outcome: Ongoing - Unchanged  Goal: Readiness for Transition of Care  03/16/2024 0651 by Tawni Millers, RN  Outcome: Ongoing - Unchanged  03/16/2024 0650 by Tawni Millers, RN  Outcome: Ongoing - Unchanged  Goal: Rounds/Family Conference  03/16/2024 0651 by Tawni Millers, RN  Outcome: Ongoing - Unchanged  03/16/2024 0650 by Tawni Millers, RN  Outcome: Ongoing - Unchanged     Problem: Infection  Goal: Absence of Infection Signs and Symptoms  03/16/2024 0651 by Tawni Millers, RN  Outcome: Ongoing - Unchanged  03/16/2024 0650 by Tawni Millers, RN  Outcome: Ongoing - Unchanged  Intervention: Prevent or Manage Infection  Recent Flowsheet Documentation  Taken 03/15/2024 2000 by Tawni Millers, RN  Infection Management: aseptic technique maintained     Problem: Fall Injury Risk  Goal: Absence of Fall and Fall-Related Injury  03/16/2024 0651 by Tawni Millers, RN  Outcome: Ongoing - Unchanged  03/16/2024 0650 by Tawni Millers, RN  Outcome: Ongoing - Unchanged  Intervention: Promote Injury-Free Environment  Recent Flowsheet Documentation  Taken 03/15/2024 2000 by Tawni Millers, RN  Safety Interventions:   fall reduction program maintained   low bed     Problem: Wound  Goal: Optimal Coping  03/16/2024 0651 by Tawni Millers, RN  Outcome: Ongoing - Unchanged  03/16/2024 0650 by Tawni Millers, RN  Outcome: Ongoing - Unchanged  Goal: Optimal Functional Ability  03/16/2024 0651 by Tawni Millers, RN  Outcome: Ongoing - Unchanged  03/16/2024 0650 by Tawni Millers, RN  Outcome: Ongoing - Unchanged  Intervention: Optimize Functional Ability  Recent Flowsheet Documentation  Taken 03/15/2024 2000 by Tawni Millers, RN  Activity Management: bedrest  Goal: Absence of Infection Signs and Symptoms  03/16/2024 0651 by Tawni Millers, RN  Outcome: Ongoing - Unchanged  03/16/2024 0650 by Tawni Millers, RN  Outcome: Ongoing - Unchanged  Intervention: Prevent or Manage Infection  Recent Flowsheet Documentation  Taken 03/15/2024 2000 by Tawni Millers, RN  Infection Management: aseptic technique maintained  Goal: Improved Oral Intake  03/16/2024 0651 by Tawni Millers, RN  Outcome: Ongoing - Unchanged  03/16/2024 0650 by Tawni Millers, RN  Outcome: Ongoing - Unchanged  Goal: Optimal Pain Control and Function  03/16/2024 0651 by Tawni Millers, RN  Outcome: Ongoing - Unchanged  03/16/2024 0650 by Tawni Millers, RN  Outcome: Ongoing - Unchanged  Goal: Skin Health and Integrity  03/16/2024 0651 by Tawni Millers, RN  Outcome: Ongoing - Unchanged  03/16/2024 0650 by Tawni Millers, RN  Outcome: Ongoing - Unchanged  Intervention: Optimize Skin Protection  Recent Flowsheet Documentation  Taken 03/15/2024 2000 by Tawni Millers, RN  Activity Management: bedrest  Goal: Optimal Wound Healing  03/16/2024 0651 by Tawni Millers, RN  Outcome: Ongoing - Unchanged  03/16/2024 0650 by Tawni Millers, RN  Outcome: Ongoing - Unchanged     Problem: Skin Injury Risk Increased  Goal: Skin Health and Integrity  03/16/2024 0651 by Tawni Millers, RN  Outcome: Ongoing - Unchanged  03/16/2024 0650 by Tawni Millers, RN  Outcome: Ongoing - Unchanged  Intervention: Optimize Skin Protection  Recent Flowsheet Documentation  Taken 03/15/2024 2000 by Tawni Millers, RN  Activity Management: bedrest     Problem: Self-Care Deficit  Goal: Improved Ability to Complete Activities of Daily Living  03/16/2024 0651 by Tawni Millers, RN  Outcome: Ongoing - Unchanged  03/16/2024 0650 by Tawni Millers, RN  Outcome: Ongoing - Unchanged     Problem: Artificial Airway  Goal: Effective Communication  03/16/2024 0651 by Tawni Millers, RN  Outcome: Ongoing - Unchanged  03/16/2024 0650 by Tawni Millers, RN  Outcome: Ongoing - Unchanged  Goal: Optimal Device Function  03/16/2024 0651 by Tawni Millers, RN  Outcome: Ongoing - Unchanged  03/16/2024 0650 by Tawni Millers, RN  Outcome: Ongoing - Unchanged  Intervention: Optimize Device Care and Function  Recent Flowsheet Documentation  Taken 03/15/2024 2000 by Tawni Millers, RN  Aspiration Precautions: respiratory status monitored  Oral Care: mouth swabbed  Goal: Absence of Device-Related Skin or Tissue Injury  03/16/2024 0651 by Tawni Millers, RN  Outcome: Ongoing - Unchanged  03/16/2024 0650 by Tawni Millers, RN  Outcome: Ongoing - Unchanged     Problem: Malnutrition  Goal: Improved Nutritional Intake  03/16/2024 0651 by Tawni Millers, RN  Outcome: Ongoing - Unchanged  03/16/2024 0650 by Tawni Millers, RN  Outcome: Ongoing - Unchanged     Problem: Mechanical Ventilation Invasive  Goal: Effective Communication  03/16/2024 0651 by Tawni Millers, RN  Outcome: Ongoing - Unchanged  03/16/2024 0650 by Tawni Millers, RN  Outcome: Ongoing - Unchanged  Goal: Optimal Device Function  03/16/2024 0651 by Tawni Millers, RN  Outcome: Ongoing - Unchanged  03/16/2024 0650 by Tawni Millers, RN  Outcome: Ongoing - Unchanged  Intervention: Optimize Device Care and Function  Recent Flowsheet Documentation  Taken 03/15/2024 2000 by Tawni Millers, RN  Oral Care: mouth swabbed  Goal: Mechanical Ventilation Liberation  03/16/2024 0651 by Tawni Millers, RN  Outcome: Ongoing - Unchanged  03/16/2024 0650 by Tawni Millers, RN  Outcome: Ongoing - Unchanged  Goal: Optimal Nutrition Delivery  03/16/2024 0651 by Tawni Millers, RN  Outcome: Ongoing - Unchanged  03/16/2024 0650 by Tawni Millers, RN  Outcome: Ongoing - Unchanged  Goal: Absence of Device-Related Skin and Tissue Injury  03/16/2024 0651 by Tawni Millers, RN  Outcome: Ongoing - Unchanged  03/16/2024 0650 by Tawni Millers, RN  Outcome: Ongoing - Unchanged  Goal: Absence of Ventilator-Induced Lung Injury  03/16/2024 0651 by Tawni Millers, RN  Outcome: Ongoing - Unchanged  03/16/2024  1610 by Tawni Millers, RN  Outcome: Ongoing - Unchanged  Intervention: Prevent Ventilator-Associated Pneumonia  Recent Flowsheet Documentation  Taken 03/15/2024 2000 by Tawni Millers, RN  Oral Care: mouth swabbed

## 2024-03-16 NOTE — Unmapped (Signed)
 Speech Language Pathology Clinical Swallow Assessment  Evaluation (03/16/24 0930)    Patient Name:  Tammy Braun       Medical Record Number: [id number]   Date of Birth: [birth year]  Sex: Female            SLP Treatment Diagnosis:  r/o oropharyngeal dysphagia  Activity Tolerance: Patient tolerated treatment well    Assessment  Patient seen for clinical swallow evaluation in the setting of prolonged intubation, extubated 3/15. Patient encountered awake, alert, well-oriented, and following commands. Tolerating 30L/30% FiO2 on HFNC. Vocal quality was significantly weak and hoarse, impacting speech intelligibility. An oral mechanism exam was completed, patient edentulous with no other gross asymmetries noted. Patient produced functional cough, indicating airway protection. Pt observed with a weak and hoarse voice at baseline, and continued throughout evaluation, with no clinical s/sx of aspiration throughout - observed patient with trials of ice chips via spoon, thin liquids via straw, purees via spoon, solids, and mixed solids/thin liquids. Recommend po regular consistency solids and thin liquids - slow rate, small bites/sips, sit upright, feed only when awake/alert, and appropriate oral care. SLP will sign off at this time. Consider ENT consult if voice does not return to baseline prior to discharge, thank you.    Risk for Aspiration: Mild     Recommendations:       Follow-up Referral Recommendations : ENT evaluation (If weak, hoarse voice does not improve, recommend ENT consult)    Diet Liquids Recommendations: Thin Liquids, Level 0, No Restrictions    Diet Solids Recommendation: Regular Consistency Solids    Recommended Form of Medications: With liquid, Whole      Recommended Compensatory Techniques : Slow rate, Small sips/bites, Upright 90 degrees, Feed only when alert/awake    Post Acute Discharge Recommendations  Post Acute SLP Discharge Recommendations: Skilled SLP services are NOT indicated    Prognosis: Fair  Positive Indicators: clinical presentation today, motivation, participation, family support    Plan of Care  SLP Daily Frequency: D/C Services, D/C Services    Treatment Goals:  Patient and Family Goal: To drink and eat food.    Subjective  Medical Updates Since Last Visit/Relevant PMH Affecting Clinical Decision Making: Tammy Braun is a 83 y.o. female with PMH bladder cancer with cystectomy and ileal conduit, COPD, recent dx of afib, moderate-severe TR, CAD (high calcification score), T2DM presented to [facility] with projectile vomiting and abdominal pain, found to have SBO with transition point at level of ileostomy on CT A/P. On initial presentation, labs remarkable for leukocytosis thought to be iso hemoconcentration, AKI on CKD, mildly elevated venous lactate improved with resuscitation. Admitted to [facility] for worsening hypoxia on HFNC requiring intubation in setting of hiatal hernia. OR on 02/28/24 for ex-lap, reduciton of parastomal hernia, small bowel resection with primary anastomosis, lysis of adhesions, abdomen left open with ABThera in place. Intubated 2/28-3/10 and 3/12-3/15. Seen today for a bedside swallow evaluation to rule out oropharyngeal dysphagia. Awake, alert, cooperative. Oriented and appropriate. Voice hoarse and low volume.      Communication Preference: Verbal  Patient/Caregiver Reports: I want some applesauce  Pain: no s/s of pain        Allergies: Lisinopril, Losartan, and Hctz [hydrochlorothiazide]  Current Facility-Administered Medications   Medication Dose Route Frequency Provider Last Rate Last Admin    acetaminophen (TYLENOL) tablet 1,000 mg  1,000 mg Enteral tube: gastric Q8H Lala Lund, ACNP   1,000 mg at 03/16/24 0700    albuterol 2.5 mg /3  mL (0.083 %) nebulizer solution 2.5 mg  2.5 mg Nebulization Q6H PRN Oliver Hum, MD   2.5 mg at 02/28/24 1322    [START ON 03/17/2024] amiodarone (PACERONE) tablet 200 mg  200 mg Enteral tube: gastric Daily Lala Lund, ACNP        arformoterol [facility]) nebulizer solution 15 mcg/2 mL  15 mcg Nebulization BID (RT) Antony Haste, MD   15 mcg at 03/16/24 0919    aspirin chewable tablet 81 mg  81 mg Enteral tube: gastric Daily Armandina Gemma, ACNP   81 mg at 03/16/24 0824    atorvastatin (LIPITOR) tablet 40 mg  40 mg Enteral tube: gastric Daily Armandina Gemma, ACNP   40 mg at 03/16/24 0824    carboxymethylcellulose sodium (THERATEARS) 0.25 % ophthalmic solution 2 drop  2 drop Both Eyes TID Lala Lund, ACNP   2 drop at 03/15/24 2019    ceftolozane-tazobactam (ZERBAXA) 750 mg in sodium chloride (NS) 0.9 % 100 mL IVPB  750 mg Intravenous Q8H Bhatt, Priyanka, ACNP   Stopped at 03/16/24 0855    dextrose (D10W) 10% bolus 125 mL  12.5 g Intravenous Q10 Min PRN Oliver Hum, MD   125 mL at 03/14/24 1815    Parenteral Nutrition (CENTRAL)   Intravenous Continuous Oliver Hum, MD        And    fat emulsion 20 % with fish oil (SMOFlipid) infusion 250 mL  250 mL Intravenous Continuous Oliver Hum, MD        flu vaccine [phone] UP)(PF)(FLUZONE HIGH DOSE)  0.5 mL Intramuscular During hospitalization Oliver Hum, MD        fluPHENAZine decanoate (PROLIXIN) injection 50 mg  50 mg Intramuscular Q30 Days Niehuus, Maceo Pro, MD   50 mg at 03/13/24 0235    furosemide (LASIX) injection 40 mg  40 mg Intravenous Once Oliver Hum, MD        heparin (porcine) 1000 unit/mL injection 1,200 Units  1,200 Units Intravenous Q1H PRN Lala Lund, ACNP   1,200 Units at 03/10/24 0120    heparin (porcine) 1000 unit/mL injection 2,000 Units  2,000 Units Intravenous Q6H PRN Armandina Gemma, ACNP   2,000 Units at 03/13/24 1226    heparin 25,000 Units/250 mL (100 units/mL) in 0.45% saline infusion (premade)  0-24 Units/kg/hr Intravenous Continuous Armandina Gemma, ACNP 12.24 mL/hr at 03/16/24 1052 18 Units/kg/hr at 03/16/24 1052    insulin NPH (HumuLIN,NovoLIN) injection 5 Units  5 Units Subcutaneous BID AC Lala Lund, ACNP   5 Units at 03/16/24 0701    insulin regular (HumuLIN,NovoLIN) injection 0-20 Units  0-20 Units Subcutaneous Q6H SCH Gracelyn Nurse L, NP   1 Units at 03/16/24 0605    ipratropium (ATROVENT) 0.02 % nebulizer solution 500 mcg  500 mcg Nebulization Q6H (RT) Tally Due, PA   500 mcg at 03/16/24 8119    methocarbamol (ROBAXIN) tablet 500 mg  500 mg Enteral tube: gastric Q8H Lala Lund, ACNP   500 mg at 03/16/24 0700    nicotine polacrilex (NICORETTE) gum 4 mg  4 mg Buccal Q1H PRN Oliver Hum, MD        Or    nicotine polacrilex (NICORETTE) lozenge 4 mg  4 mg Buccal Q1H PRN Oliver Hum, MD        NxStage RFP 400 (+/- BB) 5000 mL - contains 2 mEq/L of potassium dialysis solution 5,000 mL  5,000 mL CRRT Continuous Ambrose Mantle, MD  NxStage/multiBic RFP 401 (+/- BB) 5000 mL - contains 4 mEq/L of potassium dialysis solution 5,000 mL  5,000 mL CRRT Continuous Hajnoczky, Arna Medici, MD   5,000 mL at 03/09/24 2216    ondansetron (ZOFRAN) injection 4 mg  4 mg Intravenous Q6H PRN Oliver Hum, MD   4 mg at 03/11/24 1154    pantoprazole (Protonix) injection 40 mg  40 mg Intravenous Daily [place], [place]   40 mg at 03/16/24 1610    Parenteral Nutrition (CENTRAL)   Intravenous Continuous Niehuus, Maceo Pro, MD 35 mL/hr at 03/16/24 1000 Rate Verify at 03/16/24 1000    polyethylene glycol (MIRALAX) packet 17 g  17 g Enteral tube: gastric Daily Lala Lund, ACNP   17 g at 03/14/24 9604    sennosides (SENOKOT) oral syrup  2.5 mL Enteral tube: gastric Nightly Lala Lund, ACNP   4.4 mg at 03/12/24 2050    sodium chloride (NS) 0.9 % flush 10 mL  10 mL Intravenous Q8H Ra, Vevelyn Francois, MD   10 mL at 03/16/24 5409    sodium chloride (NS) 0.9 % flush 10 mL  10 mL Intravenous Q8H Ra, Vevelyn Francois, MD   10 mL at 03/16/24 8119    sodium chloride (NS) 0.9 % flush 10 mL  10 mL Intravenous Q8H Ra, Vevelyn Francois, MD   10 mL at 03/16/24 1478 sodium chloride 3 % NEBULIZER solution 4 mL  4 mL Nebulization Q6H (RT) Maryann Alar, PA   4 mL at 03/16/24 2956     Facility-Administered Medications Ordered in Other Encounters   Medication Dose Route Frequency Provider Last Rate Last Admin    EPINEPHrine 100 mcg/10 mL (10 mcg/mL) injection   Intravenous PRN (once a day) Ichite, Precious C, MD   10 mcg at 02/28/24 1511    Propofol (DIPRIVAN) injection   Intravenous PRN (once a day) Ichite, Precious C, MD   60 mg at 02/28/24 1507    succinylcholine (ANECTINE) injection   Intravenous PRN (once a day) Ichite, Precious C, MD   60 mg at 02/28/24 1507     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    Chronic kidney disease     Depression, psychotic (CMS-HCC)     Diabetes mellitus (CMS-HCC)     in past    Emphysema of lung (CMS-HCC)     Financial difficulties     Frail elderly     Hearing impairment     Hernia     History of transfusion     Hyperlipidemia     Hypertension     Impaired mobility     Osteoporosis     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/08/2015    Visual impairment      Family History   Problem Relation Age of Onset    Breast cancer Daughter 50    Diabetes Mother     Glaucoma Father     Colon cancer Neg Hx     Ovarian cancer Neg Hx     Endometrial cancer Neg Hx     Anesthesia problems Neg Hx     Bleeding Disorder Neg Hx      Past Surgical History:   Procedure Laterality Date    ABDOMINAL SURGERY  BLADDER SURGERY      BREAST CYST EXCISION      CHEMOTHERAPY  2012    bladder    GALLBLADDER SURGERY      stone removal    ILEOSTOMY  2012    PR COLONOSCOPY FLX DX W/COLLJ SPEC WHEN PFRMD N/A 02/09/2015    Procedure: COLONOSCOPY, FLEXIBLE, PROXIMAL TO SPLENIC FLEXURE; DIAGNOSTIC, W/WO COLLECTION SPECIMEN BY BRUSH OR WASH;  Surgeon: Dewaine Conger, MD;  Location: [facility] [place];  Service: Gastroenterology    PR EXPLORATORY OF ABDOMEN N/A 02/28/2024    Procedure: EXPLORATORY LAPAROTOMY, EXPLORATORY CELIOTOMY WITH OR WITHOUT BIOPSY(S);  Surgeon: Renda Rolls, MD;  Location: [facility];  Service: General Surgery    PR RECONSTR TOTAL SHOULDER IMPLANT Left 04/08/2018    Procedure: ARTHROPLASTY, GLENOHUMERAL JOINT; TOTAL SHOULDER(GLENOID & PROXIMAL HUMERAL REPLACEMENT(EG, TOTAL SHOULDER);  Surgeon: Tomasa Rand, MD;  Location: [facility] OR [facility];  Service: Ortho Sports Medicine    PR REOPEN RECENT ABD EXPLORATORY Midline 03/01/2024    Procedure: REOPENING OF RECENT LAPAROTOMY;  Surgeon: [name]., MD;  Location: OR [facility];  Service: Trauma    PR REOPEN RECENT ABD EXPLORATORY N/A 03/03/2024    Procedure: REOPENING OF RECENT LAPAROTOMY;  Surgeon: Joanie Coddington, MD;  Location: OR [facility];  Service: Trauma    PR SIGMOIDOSCOPY,BIOPSY N/A 03/11/2015    Procedure: Arnell Sieving; WITH BIOPSY, SINGLE OR MULTIPLE;  Surgeon: Wilburt Finlay, MD;  [facility];  Service: Gastroenterology    PR XCAPSL CTRC RMVL INSJ IO LENS PROSTH W/O ECP Left 06/05/2022    Procedure: EXTRACAPSULAR CATARACT REMOVAL W/INSERTION OF INTRAOCULAR LENS PROSTHESIS, MANUAL OR MECHANICAL TECHNIQUE WITHOUT ENDOSCOPIC CYCLOPHOTOCOAGULATION;  Surgeon: Garner Gavel, MD;  Location: [facility] OR [facility];  Service: Ophthalmology    PR XCAPSL CTRC RMVL INSJ IO LENS PROSTH W/O ECP Right 06/19/2022    Procedure: EXTRACAPSULAR CATARACT REMOVAL W/INSERTION OF INTRAOCULAR LENS PROSTHESIS, MANUAL OR MECHANICAL TECHNIQUE WITHOUT ENDOSCOPIC CYCLOPHOTOCOAGULATION;  Surgeon: Garner Gavel, MD;  Location: [facility] OR [facility];  Service: Ophthalmology     Social History     Tobacco Use    Smoking status: Every Day     Current packs/day: 1.00     Average packs/day: 1 pack/day for 65.9 years (65.9 ttl pk-yrs)     Types: Cigarettes     Start date: 04/09/1958    Smokeless tobacco: Never Tobacco comments:     15cpd   Substance Use Topics    Alcohol use: No     Alcohol/week: 0.0 standard drinks of alcohol         General:             Self-Feeding Capacity: Impaired for self-feeding                   Medical Tests / Procedures Comments: XR Chest 03/12: Improved aeration of the left lung with bilateral mid and lower lung predominant airspace disease/atelectasis. Query small right pleural effusion. XR Chest 03/15: Slight improvement pulmonary edema.  Equipment/Environment: NGT, Telemetry, Supplemental oxygen (30% high flow nasal cannula)    Precautions / Restrictions  Precautions: Falls precautions, Isolation precautions  Weight Bearing Status: Non-applicable  Required Braces or Orthoses: Non-applicable  Precautions / Restrictions comments: abdominal    Objective  Temperature Spikes Noted: No  Respiratory Status : High flow nasal cannula  History of Intubation: Yes  Length of Intubations (days): 15 days (x2 intubations)  Date extubated:  03/14/24    Behavior/Cognition: Alert, Cooperative, Pleasant mood  Positioning : Upright in bed    Oral / Motor Exam  Vocal Quality: Weak, Other (comment), Dysphonic (Moderately hoarse voice)  Volitional Swallow: Within Functional Limits   Labial ROM: Within Functional Limits   Labial Symmetry: Within Functional Limits  Labial Strength: Within Functional Limits   Lingual ROM: Within Functional Limits  Lingual Symmetry: Within Functional Limits  Lingual Strength: Within Functional Limits      Velum: elevation grossly WNL   Mandible: Within Functional Limits  Coordination: intact  Facial ROM: Within Functional Limits   Facial Symmetry: Within Functional Limits  Facial Strength: Within Functional Limits      Vocal Intensity: Moderately decreased       Apraxia: None present   Dysarthria: None present   Intelligibility: Intelligibility reduced   Breath Support: Adequate for speech   Dentition: Edentulous    Consistencies assessed: Consistencies assessed include ice chips via spoon, thin liquids (water) via straw, purees via spoon, solids by hand, and mixed solids/thin liquids    Patient at end of session: All needs in reach, In bed, Notified Nurse, Staff present, Lines intact, Notified Provider    Speech Therapy Session Duration  SLP Individual [mins]: 20    A student was present and participated in patient care today.   I was physically present and immediately available to direct and supervise tasks that were related to patient management.  The direction and supervision was continuous throughout the time these tasks were performed.         Vianne Bulls, CCC-SLP     I attest that I have reviewed the above information.  Signed: Vianne Bulls, CCC-SLP    Filed 03/16/2024

## 2024-03-16 NOTE — Unmapped (Signed)
 Pt stable on HFNC this shift, see flowsheets for settings. Heparin gtt therapeutic. Appropriate UOP via urostomy. Pt advanced to full liquid diet today; tolerating well, PO intake provided for comfort. Tube feeds restarted and slowly advancing today. No BM. Q2 turns.     Problem: Adult Inpatient Plan of Care  Goal: Plan of Care Review  Outcome: Ongoing - Unchanged  Goal: Patient-Specific Goal (Individualized)  Outcome: Ongoing - Unchanged  Goal: Absence of Hospital-Acquired Illness or Injury  Outcome: Ongoing - Unchanged  Intervention: Identify and Manage Fall Risk  Recent Flowsheet Documentation  Taken 03/16/2024 0800 by Marinus Maw, RN  Safety Interventions:   aspiration precautions   bed alarm   fall reduction program maintained   lighting adjusted for tasks/safety  Intervention: Prevent Skin Injury  Recent Flowsheet Documentation  Taken 03/16/2024 1800 by Marinus Maw, RN  Positioning for Skin: Right  Taken 03/16/2024 1600 by Marinus Maw, RN  Positioning for Skin: Left  Taken 03/16/2024 1400 by Marinus Maw, RN  Positioning for Skin: Right  Taken 03/16/2024 1200 by Marinus Maw, RN  Positioning for Skin: Left  Taken 03/16/2024 1000 by Marinus Maw, RN  Positioning for Skin: Right  Taken 03/16/2024 0800 by Marinus Maw, RN  Positioning for Skin: Left  Device Skin Pressure Protection: absorbent pad utilized/changed  Skin Protection: adhesive use limited  Intervention: Prevent and Manage VTE (Venous Thromboembolism) Risk  Recent Flowsheet Documentation  Taken 03/16/2024 1800 by Marinus Maw, RN  Anti-Embolism Device Type: SCD, Knee  Anti-Embolism Device Status: On  Anti-Embolism Device Location: BLE  Taken 03/16/2024 1600 by Marinus Maw, RN  Anti-Embolism Device Type: SCD, Knee  Anti-Embolism Device Status: On  Anti-Embolism Device Location: BLE  Taken 03/16/2024 1400 by Marinus Maw, RN  Anti-Embolism Device Type: SCD, Knee  Anti-Embolism Device Status: On  Anti-Embolism Device Location: BLE  Taken 03/16/2024 1200 by Marinus Maw, RN  Anti-Embolism Device Type: SCD, Knee  Anti-Embolism Device Status: On  Anti-Embolism Device Location: BLE  Taken 03/16/2024 1000 by Marinus Maw, RN  Anti-Embolism Device Type: SCD, Knee  Anti-Embolism Device Status: On  Anti-Embolism Device Location: BLE  Taken 03/16/2024 0800 by Marinus Maw, RN  Anti-Embolism Device Type: SCD, Knee  Anti-Embolism Device Status: On  Anti-Embolism Device Location: BLE  Intervention: Prevent Infection  Recent Flowsheet Documentation  Taken 03/16/2024 0800 by Marinus Maw, RN  Infection Prevention: cohorting utilized  Goal: Optimal Comfort and Wellbeing  Outcome: Ongoing - Unchanged  Goal: Readiness for Transition of Care  Outcome: Ongoing - Unchanged  Goal: Rounds/Family Conference  Outcome: Ongoing - Unchanged     Problem: Infection  Goal: Absence of Infection Signs and Symptoms  Outcome: Ongoing - Unchanged  Intervention: Prevent or Manage Infection  Recent Flowsheet Documentation  Taken 03/16/2024 0800 by Marinus Maw, RN  Infection Management: aseptic technique maintained  Isolation Precautions: contact precautions maintained     Problem: Fall Injury Risk  Goal: Absence of Fall and Fall-Related Injury  Outcome: Ongoing - Unchanged  Intervention: Promote Injury-Free Environment  Recent Flowsheet Documentation  Taken 03/16/2024 0800 by Marinus Maw, RN  Safety Interventions:   aspiration precautions   bed alarm   fall reduction program maintained   lighting adjusted for tasks/safety     Problem: Wound  Goal: Optimal Coping  Outcome: Ongoing - Unchanged  Goal: Optimal Functional Ability  Outcome: Ongoing - Unchanged  Intervention: Optimize Functional Ability  Recent Flowsheet Documentation  Taken 03/16/2024 0800 by Marinus Maw, RN  Activity Management: bedrest  Goal: Absence of Infection  Signs and Symptoms  Outcome: Ongoing - Unchanged  Intervention: Prevent or Manage Infection  Recent Flowsheet Documentation  Taken 03/16/2024 0800 by Marinus Maw, RN  Infection Management: aseptic technique maintained  Isolation Precautions: contact precautions maintained  Goal: Improved Oral Intake  Outcome: Ongoing - Unchanged  Goal: Optimal Pain Control and Function  Outcome: Ongoing - Unchanged  Goal: Skin Health and Integrity  Outcome: Ongoing - Unchanged  Intervention: Optimize Skin Protection  Recent Flowsheet Documentation  Taken 03/16/2024 0800 by Marinus Maw, RN  Activity Management: bedrest  Pressure Reduction Techniques: weight shift assistance provided  Head of Bed (HOB) Positioning: HOB at 30-45 degrees  Pressure Reduction Devices:   positioning supports utilized   pressure-redistributing mattress utilized  Skin Protection: adhesive use limited  Goal: Optimal Wound Healing  Outcome: Ongoing - Unchanged     Problem: Skin Injury Risk Increased  Goal: Skin Health and Integrity  Outcome: Ongoing - Unchanged  Intervention: Optimize Skin Protection  Recent Flowsheet Documentation  Taken 03/16/2024 0800 by Marinus Maw, RN  Activity Management: bedrest  Pressure Reduction Techniques: weight shift assistance provided  Head of Bed (HOB) Positioning: HOB at 30-45 degrees  Pressure Reduction Devices:   positioning supports utilized   pressure-redistributing mattress utilized  Skin Protection: adhesive use limited     Problem: Self-Care Deficit  Goal: Improved Ability to Complete Activities of Daily Living  Outcome: Ongoing - Unchanged     Problem: Artificial Airway  Goal: Effective Communication  Outcome: Ongoing - Unchanged  Goal: Optimal Device Function  Outcome: Ongoing - Unchanged  Intervention: Optimize Device Care and Function  Recent Flowsheet Documentation  Taken 03/16/2024 0800 by Marinus Maw, RN  Aspiration Precautions:   awake/alert before oral intake   upright posture maintained  Goal: Absence of Device-Related Skin or Tissue Injury  Outcome: Ongoing - Unchanged  Intervention: Maintain Skin and Tissue Health  Recent Flowsheet Documentation  Taken 03/16/2024 0800 by Marinus Maw, RN  Device Skin Pressure Protection: absorbent pad utilized/changed     Problem: Malnutrition  Goal: Improved Nutritional Intake  Outcome: Ongoing - Unchanged     Problem: Mechanical Ventilation Invasive  Goal: Effective Communication  Outcome: Ongoing - Unchanged  Goal: Optimal Device Function  Outcome: Ongoing - Unchanged  Goal: Mechanical Ventilation Liberation  Outcome: Ongoing - Unchanged  Goal: Optimal Nutrition Delivery  Outcome: Ongoing - Unchanged  Goal: Absence of Device-Related Skin and Tissue Injury  Outcome: Ongoing - Unchanged  Intervention: Maintain Skin and Tissue Health  Recent Flowsheet Documentation  Taken 03/16/2024 0800 by Marinus Maw, RN  Device Skin Pressure Protection: absorbent pad utilized/changed  Goal: Absence of Ventilator-Induced Lung Injury  Outcome: Ongoing - Unchanged  Intervention: Prevent Ventilator-Associated Pneumonia  Recent Flowsheet Documentation  Taken 03/16/2024 0800 by Marinus Maw, RN  Head of Bed [facility]) Positioning: HOB at 30-45 degrees

## 2024-03-16 NOTE — Unmapped (Signed)
 Pt remains on HFNC. Weaned this shift to 30L/30% and tolerating well. RT will continue to monitor.

## 2024-03-17 LAB — MAGNESIUM
MAGNESIUM: 2.3 mg/dL (ref 1.6–2.6)
MAGNESIUM: 2.2 mg/dL (ref 1.6–2.6)
MAGNESIUM: 2.1 mg/dL (ref 1.6–2.6)

## 2024-03-17 LAB — BASIC METABOLIC PANEL
ANION GAP: 15 mmol/L — ABNORMAL HIGH (ref 5–14)
ANION GAP: 10 mmol/L (ref 5–14)
ANION GAP: 12 mmol/L (ref 5–14)
BLOOD UREA NITROGEN: 53 mg/dL — ABNORMAL HIGH (ref 9–23)
BLOOD UREA NITROGEN: 57 mg/dL — ABNORMAL HIGH (ref 9–23)
BLOOD UREA NITROGEN: 55 mg/dL — ABNORMAL HIGH (ref 9–23)
BUN / CREAT RATIO: 45
BUN / CREAT RATIO: 49
BUN / CREAT RATIO: 47
CALCIUM: 8.8 mg/dL (ref 8.7–10.4)
CALCIUM: 8.7 mg/dL (ref 8.7–10.4)
CALCIUM: 8.7 mg/dL (ref 8.7–10.4)
CHLORIDE: 103 mmol/L (ref 98–107)
CHLORIDE: 109 mmol/L — ABNORMAL HIGH (ref 98–107)
CHLORIDE: 105 mmol/L (ref 98–107)
CO2: 24 mmol/L (ref 20.0–31.0)
CO2: 25 mmol/L (ref 20.0–31.0)
CO2: 25 mmol/L (ref 20.0–31.0)
CREATININE: 1.18 mg/dL — ABNORMAL HIGH (ref 0.55–1.02)
CREATININE: 1.16 mg/dL — ABNORMAL HIGH (ref 0.55–1.02)
CREATININE: 1.16 mg/dL — ABNORMAL HIGH (ref 0.55–1.02)
EGFR CKD-EPI (2021) FEMALE: 46 mL/min/{1.73_m2} — ABNORMAL LOW (ref >=60)
EGFR CKD-EPI (2021) FEMALE: 47 mL/min/{1.73_m2} — ABNORMAL LOW (ref >=60)
EGFR CKD-EPI (2021) FEMALE: 47 mL/min/{1.73_m2} — ABNORMAL LOW (ref >=60)
GLUCOSE RANDOM: 173 mg/dL — ABNORMAL HIGH (ref 70–99)
GLUCOSE RANDOM: 162 mg/dL — ABNORMAL HIGH (ref 70–99)
GLUCOSE RANDOM: 190 mg/dL — ABNORMAL HIGH (ref 70–179)
POTASSIUM: 4.1 mmol/L (ref 3.4–4.8)
POTASSIUM: 3.8 mmol/L (ref 3.4–4.8)
POTASSIUM: 4.1 mmol/L (ref 3.4–4.8)
SODIUM: 142 mmol/L (ref 135–145)
SODIUM: 144 mmol/L (ref 135–145)
SODIUM: 142 mmol/L (ref 135–145)

## 2024-03-17 LAB — BLOOD GAS CRITICAL CARE PANEL, ARTERIAL
BASE EXCESS ARTERIAL: -2.3 — ABNORMAL LOW (ref -2.0–2.0)
BASE EXCESS ARTERIAL: -7.1 — ABNORMAL LOW (ref -2.0–2.0)
CALCIUM IONIZED ARTERIAL (MG/DL): 5.22 mg/dL (ref 4.40–5.40)
CALCIUM IONIZED ARTERIAL (MG/DL): 5.23 mg/dL (ref 4.40–5.40)
GLUCOSE WHOLE BLOOD: 218 mg/dL — ABNORMAL HIGH (ref 70–179)
GLUCOSE WHOLE BLOOD: 252 mg/dL — ABNORMAL HIGH (ref 70–179)
HCO3 ARTERIAL: 18 mmol/L — ABNORMAL LOW (ref 22–27)
HCO3 ARTERIAL: 22 mmol/L (ref 22–27)
HEMOGLOBIN BLOOD GAS: 7.8 g/dL — ABNORMAL LOW (ref 12.00–16.00)
HEMOGLOBIN BLOOD GAS: 7.9 g/dL — ABNORMAL LOW (ref 12.00–16.00)
LACTATE BLOOD ARTERIAL: 1 mmol/L (ref <1.3)
LACTATE BLOOD ARTERIAL: 1.8 mmol/L — ABNORMAL HIGH (ref <1.3)
O2 SATURATION ARTERIAL: 97.4 % (ref 94.0–100.0)
O2 SATURATION ARTERIAL: 99 % (ref 94.0–100.0)
PCO2 ARTERIAL: 35 mmHg (ref 35.0–45.0)
PCO2 ARTERIAL: 39.6 mmHg (ref 35.0–45.0)
PH ARTERIAL: 7.33 — ABNORMAL LOW (ref 7.35–7.45)
PH ARTERIAL: 7.37 (ref 7.35–7.45)
PO2 ARTERIAL: 153 mmHg — ABNORMAL HIGH (ref 80.0–110.0)
PO2 ARTERIAL: 91.7 mmHg (ref 80.0–110.0)
POTASSIUM WHOLE BLOOD: 3.6 mmol/L (ref 3.4–4.6)
POTASSIUM WHOLE BLOOD: 3.8 mmol/L (ref 3.4–4.6)
SODIUM WHOLE BLOOD: 140 mmol/L (ref 135–145)
SODIUM WHOLE BLOOD: 142 mmol/L (ref 135–145)

## 2024-03-17 LAB — CBC
HEMATOCRIT: 26.8 % — ABNORMAL LOW (ref 34.0–44.0)
HEMOGLOBIN: 8.4 g/dL — ABNORMAL LOW (ref 11.3–14.9)
MEAN CORPUSCULAR HEMOGLOBIN CONC: 31.2 g/dL — ABNORMAL LOW (ref 32.0–36.0)
MEAN CORPUSCULAR HEMOGLOBIN: 26.7 pg (ref 25.9–32.4)
MEAN CORPUSCULAR VOLUME: 85.7 fL (ref 77.6–95.7)
MEAN PLATELET VOLUME: 9.4 fL (ref 6.8–10.7)
PLATELET COUNT: 290 10*9/L (ref 150–450)
RED BLOOD CELL COUNT: 3.13 10*12/L — ABNORMAL LOW (ref 3.95–5.13)
RED CELL DISTRIBUTION WIDTH: 18.1 % — ABNORMAL HIGH (ref 12.2–15.2)
WBC ADJUSTED: 11.9 10*9/L — ABNORMAL HIGH (ref 3.6–11.2)

## 2024-03-17 LAB — PHOSPHORUS
PHOSPHORUS: 3.7 mg/dL (ref 2.4–5.1)
PHOSPHORUS: 3.7 mg/dL (ref 2.4–5.1)
PHOSPHORUS: 3.5 mg/dL (ref 2.4–5.1)

## 2024-03-17 LAB — APTT
APTT: 59.2 s — ABNORMAL HIGH (ref 24.8–38.4)
HEPARIN CORRELATION: 0.3

## 2024-03-17 MED ADMIN — insulin NPH (HumuLIN,NovoLIN) injection 5 Units: 5 [IU] | SUBCUTANEOUS | @ 21:00:00

## 2024-03-17 MED ADMIN — furosemide (LASIX) injection 80 mg: 80 mg | INTRAVENOUS | @ 21:00:00 | Stop: 2024-03-18

## 2024-03-17 MED ADMIN — insulin regular (HumuLIN,NovoLIN) injection 0-20 Units: 0-20 [IU] | SUBCUTANEOUS | @ 11:00:00

## 2024-03-17 MED ADMIN — aspirin chewable tablet 81 mg: 81 mg | GASTROENTERAL | @ 13:00:00

## 2024-03-17 MED ADMIN — acetaminophen (OFIRMEV) 10 mg/mL injection 1,000 mg: 1000 mg | INTRAVENOUS | @ 05:00:00 | Stop: 2024-03-18

## 2024-03-17 MED ADMIN — Parenteral Nutrition (CENTRAL): INTRAVENOUS | @ 02:00:00 | Stop: 2024-03-17

## 2024-03-17 MED ADMIN — furosemide (LASIX) injection 80 mg: 80 mg | INTRAVENOUS | @ 13:00:00 | Stop: 2024-03-18

## 2024-03-17 MED ADMIN — potassium chloride 20 mEq in 100 mL IVPB Premix: 20 meq | INTRAVENOUS | @ 21:00:00 | Stop: 2024-03-17

## 2024-03-17 MED ADMIN — sodium chloride 3 % NEBULIZER solution 4 mL: 4 mL | RESPIRATORY_TRACT | @ 08:00:00

## 2024-03-17 MED ADMIN — carboxymethylcellulose sodium (THERATEARS) 0.25 % ophthalmic solution 2 drop: 2 [drp] | OPHTHALMIC

## 2024-03-17 MED ADMIN — sodium chloride (NS) 0.9 % flush 10 mL: 10 mL | INTRAVENOUS | @ 02:00:00

## 2024-03-17 MED ADMIN — pantoprazole (Protonix) injection 40 mg: 40 mg | INTRAVENOUS | @ 13:00:00

## 2024-03-17 MED ADMIN — HYDROmorphone (PF) (DILAUDID) injection Syrg 0.25 mg: .25 mg | INTRAVENOUS | @ 09:00:00 | Stop: 2024-03-17

## 2024-03-17 MED ADMIN — ipratropium (ATROVENT) 0.02 % nebulizer solution 500 mcg: 500 ug | RESPIRATORY_TRACT | @ 14:00:00

## 2024-03-17 MED ADMIN — carboxymethylcellulose sodium (THERATEARS) 0.25 % ophthalmic solution 2 drop: 2 [drp] | OPHTHALMIC | @ 18:00:00

## 2024-03-17 MED ADMIN — ceftolozane-tazobactam (ZERBAXA) 750 mg in sodium chloride (NS) 0.9 % 100 mL IVPB: 750 mg | INTRAVENOUS | @ 03:00:00 | Stop: 2024-03-18

## 2024-03-17 MED ADMIN — ceftolozane-tazobactam (ZERBAXA) 750 mg in sodium chloride (NS) 0.9 % 100 mL IVPB: 750 mg | INTRAVENOUS | @ 20:00:00 | Stop: 2024-03-20

## 2024-03-17 MED ADMIN — amiodarone (PACERONE) tablet 200 mg: 200 mg | GASTROENTERAL | @ 13:00:00

## 2024-03-17 MED ADMIN — sodium chloride (NS) 0.9 % flush 10 mL: 10 mL | INTRAVENOUS | @ 11:00:00

## 2024-03-17 MED ADMIN — sodium chloride (NS) 0.9 % flush 10 mL: 10 mL | INTRAVENOUS | @ 18:00:00

## 2024-03-17 MED ADMIN — arformoterol (BROVANA) nebulizer solution 15 mcg/2 mL: 15 ug | RESPIRATORY_TRACT | @ 13:00:00

## 2024-03-17 MED ADMIN — heparin 25,000 Units/250 mL (100 units/mL) in 0.45% saline infusion (premade): 0-24 [IU]/kg/h | INTRAVENOUS | @ 12:00:00

## 2024-03-17 MED ADMIN — ipratropium (ATROVENT) 0.02 % nebulizer solution 500 mcg: 500 ug | RESPIRATORY_TRACT | @ 08:00:00

## 2024-03-17 MED ADMIN — furosemide (LASIX) injection 80 mg: 80 mg | INTRAVENOUS | @ 02:00:00 | Stop: 2024-03-16

## 2024-03-17 MED ADMIN — insulin regular (HumuLIN,NovoLIN) injection 0-20 Units: 0-20 [IU] | SUBCUTANEOUS | @ 05:00:00

## 2024-03-17 MED ADMIN — furosemide (LASIX) injection 40 mg: 40 mg | INTRAVENOUS | Stop: 2024-03-16

## 2024-03-17 MED ADMIN — acetaminophen (OFIRMEV) 10 mg/mL injection 1,000 mg: 1000 mg | INTRAVENOUS | @ 13:00:00 | Stop: 2024-03-18

## 2024-03-17 MED ADMIN — sennosides (SENOKOT) oral syrup: 2.5 mL | GASTROENTERAL

## 2024-03-17 MED ADMIN — acetaminophen (OFIRMEV) 10 mg/mL injection 1,000 mg: 1000 mg | INTRAVENOUS | @ 20:00:00 | Stop: 2024-03-18

## 2024-03-17 MED ADMIN — insulin NPH (HumuLIN,NovoLIN) injection 5 Units: 5 [IU] | SUBCUTANEOUS | @ 11:00:00

## 2024-03-17 MED ADMIN — sodium chloride 3 % NEBULIZER solution 4 mL: 4 mL | RESPIRATORY_TRACT | @ 20:00:00

## 2024-03-17 MED ADMIN — atorvastatin (LIPITOR) tablet 40 mg: 40 mg | GASTROENTERAL | @ 13:00:00

## 2024-03-17 MED ADMIN — insulin regular (HumuLIN,NovoLIN) injection 0-20 Units: 0-20 [IU] | SUBCUTANEOUS | @ 17:00:00

## 2024-03-17 MED ADMIN — carboxymethylcellulose sodium (THERATEARS) 0.25 % ophthalmic solution 2 drop: 2 [drp] | OPHTHALMIC | @ 13:00:00

## 2024-03-17 MED ADMIN — ipratropium (ATROVENT) 0.02 % nebulizer solution 500 mcg: 500 ug | RESPIRATORY_TRACT | @ 20:00:00

## 2024-03-17 MED ADMIN — sodium chloride 3 % NEBULIZER solution 4 mL: 4 mL | RESPIRATORY_TRACT | @ 14:00:00

## 2024-03-17 NOTE — Unmapped (Signed)
 Palliative Care Consult Note    Consultation from Requesting Attending Physician:  Tammy Coddington, MD  Service Requesting Consult:  Tammy Braun [facility])  Reason for Consult Request from Attending Physician:  Evaluation of Goals of Care / Decision Making  Primary Care Provider:  Vanetta Shawl, MD    Assessment/Plan:      SUMMARY:  This 83 y.o. patient is seriously and acutely ill due to small bowel obstruction, complicated by co-morbid acute and chronic conditions including acute hypoxic respiratory failure requiring intubation x2, aspiration, hiatal hernia, s/p OR on 02/28/24 for ex-lap, reduciton of parastomal hernia, small bowel resection with primary anastomosis and lysis of adhesions, AKI on CKD, atrial fibrillation, pulmonary embolism, schizoaffective disorder, HFpEF, pulmonary hypertension, HTN, COPD, bladder cancer with cystectomy and ileal conduit, moderate-severe TR, CAD (high calcification score), T2DM.    Palliative Care was consulted for goals of care    Symptom Assessment and Recommendations:    None needed at this time    Communication:  - Patient's daughter Tammy Braun plays an active role in care and appreciates knowing what interventions are being done and the reasoning behind them so she is kept fully up to date    Goals of Care and Decision Making Assessment and Recommendations:     Healthcare Decision Maker if lacks capacity:    HCDM (patient stated preference) (Active): Tammy Braun - Daughter - [phone]    Advance Directive: no    Code status:   Code Status: Full Code         Goals of care as discussed on 3/18: Continue current medical management. Patient's daughter Tammy Braun shares that she has good understanding of what her mother wants and that they make decisions together as [name] relies on Tammy Braun to help her understand and process information. Tammy Braun notes the importance of understanding each specific situation to try and ensure the appropriate decision is made (example: intubation for an acute, reversible event vs long-term ventilator support). We talked through quality of life and the importance of defining what that means to each individual person to align our care based on what is important to them.    Practical, Emotional, Spiritual Support Assessment and Recommendations:  Palliative care visit today included focused interview, active listening, therapeutic use of silence, offering support, sharing empathy and summarization of today's discussion.      - Communicated with and recommendations shared with primary team in person  - Communicated with primary RN in person      Thank you for this consult. Please contact Beryle Beams, PA-C or page Palliative Care if there are any questions.     Subjective:     HPI:  obtained from chart review, patient and patient's daughter    [name] is an 83 y.o. female with PMHx of atrial fibrillation, schizoaffective disorder, HFpEF, pulmonary hypertension, HTN, COPD, hiatal hernia, bladder cancer with cystectomy and ileal conduit, moderate-severe TR, CAD (high calcification score), T2DM who presented to [facility] with projectile vomiting and abdominal pain and was found to have SBO. Her course has been complicated by acute hypoxic respiratory failure requiring intubation x2, aspiration, s/p OR on 02/28/24 for ex-lap, reduciton of parastomal hernia, small bowel resection with primary anastomosis and lysis of adhesions, AKI on CKD    [name] lives with her daughter Tammy Braun. She has lived with her for 16-17 years. She is mobile and uses a walker or cane to get around. She cannot walk long distances. She needs assistance in some  ADL's like taking a bath/shower. She enjoys keeping up with what her children and grandchildren are doing throughout each day. She loves going for rides in the car with her family. 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 She has 6 children total (4 living), 11-12 grandchildren and a few great-grandchildren.     During the visit, she repeatedly asked for water and both myself and her daughter explained the reasoning why she was unsafe to drink water at this time. Per her daughter, this is not new for her as she repetitively asks for things at home to, wear family down to get her way. She is used to always being on the move in some capacity.      Symptom Severity, Assessment and Current Medication / Treatment:     Pain: Denies  Shortness of breath: (+) reports intermittent shortness of breath  Nausea: Denies currently but has history of during admission secondary to SBO  Constipation: (+) secondary to SBO; was starting to have small BM    Objective:     Function:  40% - Ambulation: Mainly bed / Unable to do any work, extensive disease / Self-Care:[employer] / Intake: Normal or reduced / Level of Conscious: Full , drowsy, or confusion    Temp:  [36.5 ??C (97.7 ??F)-36.8 ??C (98.2 ??F)] 36.7 ??C (98.1 ??F)  Heart Rate:  [93-132] 117  SpO2 Pulse:  [96-129] 120  Resp:  [15-31] 23  BP: (97-170)/(54-101) 113/55  A BP-1: (137-147)/(74-79) 147/74  FiO2 (%):  [30 %-60 %] 40 %  SpO2:  [86 %-100 %] 99 %    Physical Exam:  Constitutional: chronically ill-appearing female in no acute distress lying in bed with daughter present   Eyes: anicteric sclera, no discharge  ENMT: mucous membranes moist  Pulm: Breathing comfortably on HFNC   CV: Irregular rate, irregular rhythm  Abd: Non-distended  MS: (+) sarcopenia  Skin: Warm, dry  Neuro: Alert; voice hoarse but able to converse a little; repeatedly asking for water during visit  Psych: mood and affect euthymic    Testing reviewed and interpreted:   Reviewed and interpreted test results for CMP with elevated Cr and hypoalbuminemia; chest x-ray with complete left hemithorax opacification and moderate right pleural effusion affecting assessment of underlying illness severity and prognosis    I personally spent 120 minutes face-to-face and non-face-to-face in the care of this patient, which includes all pre, intra, and post visit time on the date of service.  All documented time was specific to the E/M visit and does not include any procedures that may have been performed.     See ACP Note from today for additional billable service:  No.     Beryle Beams, PA-C  Physician Assistant  Palliative Care

## 2024-03-17 NOTE — Unmapped (Signed)
 [facility] PROGRESS NOTE     Date of Service: 03/17/2024    Hospital Day: LOS: 19 days        Surgery Date: TBD   Surgical Attending: Joanie Coddington, MD    Critical Care Attending: Lia Hopping, MD    Interval History:   Worsening respiratory status s/p aspiration event over night with immediate increase in oxygen requirements from HFNC 30L, 30% to HFNC 60L, 80% to Bipap. CXR demonstrating complete opacification of left lung. Strict NPO, holding TF while on bipap. Diuresed with total 120mg  lasix for aggressive diuresis with poor response, net - 780cc/24h. Discussions began with patient and family regarding likelyhood of requiring re-intubation for the 3rd time and the surgical team's recommendation for tracheostomy and jtube placement. Further discussions ongoing throughout the day.     Clinically, the patient is tolerating BiPap and has been placed on HFNC in order to participate in GOC conversations with palliative care.     History of Present Illness:   Tammy Braun is a 83 y.o. female with PMH bladder cancer with cystectomy and ileal conduit, COPD, recent dx of afib, moderate-severe TR, CAD (high calcification score), T2DM presented to [facility] with projectile vomiting and abdominal pain, found to have SBO with transition point at level of ileostomy on CT A/P. On initial presentation, labs remarkable for leukocytosis thought to be iso hemoconcentration, AKI on CKD, mildly elevated venous lactate improved with resuscitation. Admitted to [facility] for worsening hypoxia on HFNC requiring intubation in setting of hiatal hernia. OR on 02/28/24 for ex-lap, reduciton of parastomal hernia, small bowel resection with primary anastomosis and lysis of adhesions.      Hospital Course:  - 02/26/24: Admitted to [facility], floor status, consult to urology for possible ileal conduit revision.   - 02/27/24: Patient level of care escalated to step-down iso increased oxygen needs while in ED.  - 02/28/24: Upgraded to ICU. Intubated at bed-side. OR for ex-lap, reduction of parastomal hernia, small bowel resection with primary anastomosis, lysis of adhesions, abdomen left open with ABThera in place.   - 03/01/24: OR for attempt at abdominal closure. Left open due to desaturations during closure attempt. ABThera in place.  - 03/03/24: RTOR for abdominal closure  -03/06/24: Vas cath placed, CRRT started   -03/09/24: Extubated to HFNC  -03/10/24: CRRT discontinued   - 03/11/24: Re-intubated for hypoxic respiratory failure 2/2 mucus plug   - 03/14/24 Extubated to HFNC   -03/15/24: Vas cath removed   -03/16/24: Placed on BiPap for complete collapse of L lung 2/2 probable mucus plugging     ASSESSMENT & PLAN:     Neurologic:  - Pain: tylenol and robaxin SCH  - Discontinue PRN oxy and HM at family request    #schizoaffective disorder  - Monthly prolixin injection (given 3/14)    Cardiovascular:  - MAP goal > 65    #HFpEF  #Pulm HTN  - Off pressors  - ECHO 3/12- LVEF 70%, moderately dilated RV with moderate systolic dysfunction, severe pulmonary hypertension.    #Hx of HTN  -holding home amlodipine    #Hx of HLD  - atorvastatin    #Afib/flutter w RVR:   - Prior hx of paroxysmal afib. CHADSvasc = 6, no AC PTA per chart review.      > Holding home diltiazem, question efficacy given likely poor absorption  - Amio via NG tube 200mg  daily   - heparin gtt for [facility]     #Pulmonary embolism  - 3/5 CTA  chest: Filling defect in L anterior segmental artery  - heparin gtt     #Acute thrombus right basilic   - Identified 3/3 on BUE duplex  - Supportive care, superficial, AC not required     Respiratory:  #Acute on chronic hypoxemia  #Hx of COPD  - Aggressive pulmonary toilet  - Start Brovana, continue atrovent nebs  - Continue hypertonic saline nebs for clearance     #Aspiration  - Continue Bipap, ok to break on HFNC for short breaks, comfort, participation in conversions s/p aspiration event 3/17.   - Transitioned to HFNC for ~ 1.5 hours prior to asking to be placed back on Bipap 2/2 SOB  - ABG pending, check PRN     #Pseudomonas Pneumonia   #VAP  - see ID for abx plan    FEN/GI:  F: ML  E: Replete electrolytes PRN  N: NPO, TF held    - Renew TPN   - SLP consult on 3/17 - Ok for PO diet, continue strict NPO  - GI prophylaxis: pepcid  - Bowel regimen: cont miralax and senna   - Last BM: 3/16    #S/p ex-lap, reduction of parastomal hernia, small bowel resection with primary anastomosis, lysis of adhesions  -CT 3/13 demonstrating partial small bowel obstruction  -Blake drain#2 removed 3/16  - Blake drain #1 with (25/13) SS output.     #Large hiatal hernia, chronic  - re-eval on CT chest/abd/pelvis 3/13 stable    Renal/Genitourinary:  #AKI on CKD  -Nephrology signed off, HD cath removed  - Cr stable, cystatin c remains elevated (~1 higher than Scr)  - Diurese with IV lasix 80 mg q8h x3 doses, goal for net negative 1 to 1.5L       #H/o bladder cancer s/p radical cystectomy with ileal conduit (2012):  #Parastomal hernia:  - Strict I&Os  - Ok for no foley in stoma at this time per urology    Endocrine:   #hyperglycemia   - Glycemic Control: SSI, NPH 5 unit BID    Hematologic:   - Hgb stable  - Monitor CBC daily, transfuse for hgb 7mg /dL  - DVT ppx: heparin gtt for PE    Immunologic/Infectious Disease:  - Afebrile with resolving leukocytosis     - Cultures:   - Blood cultures (3/4): negative, final   - BAL (3/4): Pseudomonas aeruginosa - susceptible to meropenum    - BAL (3/8): Pseudomonas aeruginosa and candida glabrata  -3/12: Susceptibilities show resistance to Meropenum, Cefepime, Ceftaz and Zosyn.   - Bcx (3/11): Negative, final  - BAL (3/12): 600,000+ psuedomonas (susceptibilities: resistant to meropenum)  - Ucx (3/13): no growth    - Antimicrobials:    - Ceftriaxone 2/27- 2/28 - colonized UTI   - Zosyn (3/1 - 3/5) for abdominal contamination  - Zosyn (3/6) for pseudomonas when sensitivities remained pending  - Zerbaxa (3/12- ) for 7-10 day course, pending clinical course  - ID following, appreciate recs    Musculoskeletal:   #Deconditioned   - PT/OT consulted     Daily Care Checklist:   - Stress Ulcer Prevention: No  - DVT Prophylaxis: Chemical: Heparin drip  - Daily Awakening: Yes  - Spontaneous Breathing Trial: Yes  - Indication for Central/PICC Line: Yes  Infusions requiring central access, Hemodynamic monitoring, and Inadequate peripheral access, LIJ triple lumen  - Indication for Urinary Catheter: No   - Diagnostic images/reports of past 24hrs reviewed: Yes    Disposition:   - Continue ICU  care  - PT/OT consulted:yes     SUBJECTIVE:    Alert and appropriate. Follows commands. Denies pain. Asking for water.     OBJECTIVE:     Physical Exam:  Constitutional: Sitting in bed, in moderate respiratory distress  Neurologic: Opens eyes to voice. Nods yes/no appropriately. Pupils equal and responsive to light.   Respiratory: Tachypnea ~40RR, coarse breath sounds L>R, on Bipap, desats to 70s w/ removal   Cardiovascular:  Irregularly irregular rhythm ~120bpm, +2 pulses in all extremities   Gastrointestinal: Soft abdomen, midline incision staples in tact. Urostomy draining clear yellow urine. Surgical drainx1 with scant SS output  Musculoskeletal: Trace BLE/BUE edema.  Skin: Warm and dry     Temp:  [36.5 ??C (97.7 ??F)-36.7 ??C (98 ??F)] 36.6 ??C (97.8 ??F)  Heart Rate:  [97-132] 102  SpO2 Pulse:  [101-129] 108  Resp:  [14-31] 17  BP: (97-170)/(54-101) 97/69  MAP (mmHg):  [69-108] 76  A BP-1: (91-154)/(72-93) 147/74  MAP:  [95 mmHg-202 mmHg] 182 mmHg  FiO2 (%):  [30 %-60 %] 35 %  SpO2:  [86 %-100 %] 97 %     Recent Laboratory Results:  No results for input(s): SPECTYPEART, PHART, PCO2ART, PO2ART, HCO3ART, BEART, O2SATART in the last 24 hours.      Recent Labs     Units 03/16/24  0424 03/16/24  1558 03/17/24  0424   NA mmol/L 143 - 142 144 142   K mmol/L 4.0 - 3.9 4.1 4.1   CL mmol/L 107 111* 103   CO2 mmol/L 22.0 22.0 24.0   BUN mg/dL 47* 51* 53*   CREATININE mg/dL 3.26* 7.12* 4.58*   GLU mg/dL 099* 833* 825*     Lab Results   Component Value Date    BILITOT 0.3 03/15/2024    BILITOT 0.4 03/14/2024    BILIDIR 0.20 03/15/2024    BILIDIR 0.20 03/14/2024    ALT 10 03/15/2024    ALT 11 03/14/2024    AST 13 03/15/2024    AST 13 03/14/2024    GGT 16 09/17/2011    GGT 354 (H) 05/03/2011    ALKPHOS 60 03/15/2024    ALKPHOS 58 03/14/2024    PROT 5.7 03/15/2024    PROT 5.4 (L) 03/14/2024    ALBUMIN 1.9 (L) 03/15/2024    ALBUMIN 1.8 (L) 03/14/2024     Recent Labs     Units 03/16/24  1135 03/16/24  1733 03/17/24  0038 03/17/24  0641   POCGLU mg/dL 053* 976* 734* 193     Recent Labs     Units 03/15/24  0332 03/16/24  0424 03/17/24  0424   WBC 10*9/L 10.6 8.9 11.9*   RBC 10*12/L 2.79* 2.73* 3.13*   HGB g/dL 7.8* 7.6* 8.4*   HCT % 23.7* 23.2* 26.8*   MCV fL 85.0 85.0 85.7   MCH pg 28.1 27.6 26.7   MCHC g/dL 79.0 24.0 97.3*   RDW % 18.2* 17.8* 18.1*   PLT 10*9/L 243 240 290   MPV fL 9.7 9.9 9.4     Recent Labs     Units 03/17/24  0424   APTT sec 59.2*      Lines & Tubes:   Patient Lines/Drains/Airways Status       Active Peripheral & Central Intravenous Access       Name Placement date Placement time Site Days    Peripheral IV 03/04/24 Anterior;Proximal;Right Forearm 03/04/24  0021  Forearm  13    CVC Triple Lumen  03/07/24 Non-tunneled Left Internal jugular 03/07/24  0849  Internal jugular  9                     Patient Lines/Drains/Airways Status       Active Wounds       Name Placement date Placement time Site Days    Surgical Site 04/08/18 Shoulder Left 04/08/18  1338  -- 2169    Surgical Site 06/05/22 Eye Left 06/05/22  0928  -- 650    Surgical Site 06/19/22 Eye Right 06/19/22  1024  -- 636    Surgical Site 03/03/24 Abdomen 03/03/24  1013  -- 13    Wound 06/24/20 Other (comment) Buttocks inner right buttock 06/24/20  1010  Buttocks  1361    Wound 03/11/24 Other (comment) Sacrum Mid moisture/friction injury 03/11/24  1200  Sacrum  5    Wound 03/12/24 Other (comment) Groin Right 03/12/24  1500  Groin  4    Wound 03/13/24 Neck Right open area note arount the HD catheter durng the dressing change 03/13/24  0930  Neck  3                     Respiratory/ventilator settings for last 24 hours:   FiO2 (%): 35 %  S RR: 18    Intake/Output last 3 shifts:  I/O last 3 completed shifts:  In: 3369 [P.O.:10; I.V.:629.2; NG/GT:470; IV Piggyback:106.3]  Out: 3538 [Urine:3460; Emesis/NG output:50; Drains:28]    Daily/Recent Weight:  72 kg (158 lb 11.7 oz)    BMI:  Body mass index is 30.01 kg/m??.    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    Chronic kidney disease     Depression, psychotic (CMS-HCC)     Diabetes mellitus (CMS-HCC)     in past    Emphysema of lung (CMS-HCC)     Financial difficulties     Frail elderly     Hearing impairment     Hernia     History of transfusion     Hyperlipidemia     Hypertension     Impaired mobility     Osteoporosis     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/08/2015    Visual impairment      Past Surgical History:   Procedure Laterality Date    ABDOMINAL SURGERY      BLADDER SURGERY      BREAST CYST EXCISION      CHEMOTHERAPY  2012    bladder    GALLBLADDER SURGERY      stone removal    ILEOSTOMY  2012    PR COLONOSCOPY FLX DX W/COLLJ SPEC WHEN PFRMD N/A 02/09/2015    Procedure: COLONOSCOPY, FLEXIBLE, PROXIMAL TO SPLENIC FLEXURE; DIAGNOSTIC, W/WO COLLECTION SPECIMEN BY BRUSH OR WASH;  Surgeon: Dewaine Conger, MD;  Location: [facility] [place];  Service: Gastroenterology    PR EXPLORATORY OF ABDOMEN N/A 02/28/2024    Procedure: EXPLORATORY LAPAROTOMY, EXPLORATORY CELIOTOMY WITH OR WITHOUT BIOPSY(S);  Surgeon: Renda Rolls, MD;  Location: [facility] OR [facility];  Service: General Surgery    PR RECONSTR TOTAL SHOULDER IMPLANT Left 04/08/2018    Procedure: ARTHROPLASTY, GLENOHUMERAL JOINT; TOTAL SHOULDER(GLENOID & PROXIMAL HUMERAL REPLACEMENT(EG, TOTAL SHOULDER);  Surgeon: Tomasa Rand, MD;  [facility]: [facility];  Service: Ortho Sports Medicine    PR REOPEN RECENT ABD EXPLORATORY Midline 03/01/2024    Procedure: REOPENING OF RECENT LAPAROTOMY;  Surgeon: [name]., MD;  Location: OR [facility];  Service: Trauma    PR REOPEN RECENT ABD EXPLORATORY N/A 03/03/2024    Procedure: REOPENING OF RECENT LAPAROTOMY;  Surgeon: Joanie Coddington, MD;  Location: OR [facility];  Service: Trauma    PR SIGMOIDOSCOPY,BIOPSY N/A 03/11/2015    Procedure: SIGMOIDOSCOPY, FLEXIBLE; WITH BIOPSY, SINGLE OR MULTIPLE;  Surgeon: Wilburt Finlay, MD;  [facility];  Service: Gastroenterology    PR XCAPSL CTRC RMVL INSJ IO LENS PROSTH W/O ECP Left 06/05/2022    Procedure: EXTRACAPSULAR CATARACT REMOVAL W/INSERTION OF INTRAOCULAR LENS PROSTHESIS, MANUAL OR MECHANICAL TECHNIQUE WITHOUT ENDOSCOPIC CYCLOPHOTOCOAGULATION;  Surgeon: Garner Gavel, MD;  Location: [facility] OR [facility];  Service: Ophthalmology    PR XCAPSL CTRC RMVL INSJ IO LENS PROSTH W/O ECP Right 06/19/2022    Procedure: EXTRACAPSULAR CATARACT REMOVAL W/INSERTION OF INTRAOCULAR LENS PROSTHESIS, MANUAL OR MECHANICAL TECHNIQUE WITHOUT ENDOSCOPIC CYCLOPHOTOCOAGULATION;  Surgeon: Garner Gavel, MD;  Location: [facility] OR [facility];  Service: Ophthalmology     Scheduled Medications:   acetaminophen  1,000 mg Intravenous Q8H    amiodarone  200 mg Enteral tube: gastric Daily    arformoterol  15 mcg Nebulization BID (RT)    aspirin  81 mg Enteral tube: gastric Daily    atorvastatin  40 mg Enteral tube: gastric Daily    carboxymethylcellulose sodium  2 drop Both Eyes TID    ceftolozane-tazobactam  750 mg Intravenous Q8H    flu vacc [id number](64yr up)-PF  0.5 mL Intramuscular During hospitalization    fluPHENAZine decanoate  50 mg Intramuscular Q30 Days    insulin NPH  5 Units Subcutaneous BID AC    insulin regular  0-20 Units Subcutaneous Q6H SCH ipratropium  500 mcg Nebulization Q6H (RT)    pantoprazole (Protonix) intravenous solution  40 mg Intravenous Daily    polyethylene glycol  17 g Enteral tube: gastric Daily    sennosides  2.5 mL Enteral tube: gastric Nightly    sodium chloride  10 mL Intravenous Q8H    sodium chloride  10 mL Intravenous Q8H    sodium chloride  10 mL Intravenous Q8H    sodium chloride  4 mL Nebulization Q6H (RT)     Continuous Infusions:   heparin 18 Units/kg/hr (03/17/24 0600)    NxStage RFP 400 (+/- BB) 5000 mL - contains 2 mEq/L of potassium      NxStage/multiBic RFP 401 (+/- BB) 5000 mL - contains 4 mEq/L of potassium      Parenteral Nutrition (CENTRAL) 40 mL/hr at 03/17/24 0600     PRN Medications:  albuterol, dextrose in water, heparin (porcine), heparin (porcine), nicotine polacrilex **OR** nicotine polacrilex, ondansetron    Tammy Braun is critically ill due to acute kidney injury, altered mental status, atrial fibrillation, pneumonia, pulmonary embolism. This critical care time includes examining the patient, evaluating the hemodynamic, laboratory, and radiographic data, independently developing a comprehensive management plan, and serially assessing the patient's response to these critical care interventions. This critical care time excludes procedures.    Critical care time: 90 minutes     Katina Degree, ACNP   Surgical Intensive Care Unit  [facility] [place] [facility]

## 2024-03-17 NOTE — Unmapped (Signed)
 Adult Nutrition Progress Note      Visit Type: Follow-Up  Reason for Visit: Enteral Nutrition, Parenteral Nutrition    PN start date: restart 3/15 via non-tunnelled CVC     increasing calories towards goal by addung lipids since TFs now held  Hold TFs in the setting of aspiration event and now on bipap.  May need g-j tube    Interval Events:  SLP cleared pt for a regular diet. She aspirated with attempts at oral intake. Currently on bipap. Made NPO and TFs held. NGT to straight drain. Continues on PN.     Daily Estimated Nutrient Needs:  Energy: 1525  1830 kcals 25-30 kcal/kg using estimated dry weight, 61 kg (03/02/24 1026)]  Protein: 90-120 gm [1.5-2.0 gm/kg using estimated dry weight, 61 kg (03/02/24 1026)]    Will continue to follow daily while on PN with full assessments on Mondays    Arcola Jansky MS, RD, LDN, CNSC

## 2024-03-17 NOTE — Unmapped (Addendum)
 [facility]U PROGRESS NOTE     Date of Service: 03/17/2024    Hospital Day: LOS: 19 days        Surgery Date: TBD   Surgical Attending: Joanie Coddington, MD    Critical Care Attending: Lia Hopping, MD    Interval History:   Worsening respiratory status s/p aspiration event tonight with immediate increase in oxygen requirements from HFNC 30L, 30% to HFNC 60L, 80%. CXR demonstrating complete opacification of left lung. Placed on Bipap with improvement, will leave on overnight and re-trial HFNC in morning. Now strict NPO, holding TF while on bipap. Diuresed with total 120mg  lasix for aggressive diuresis with poor response, approximately 500cc UOP.     History of Present Illness:   Tammy Braun is a 83 y.o. female with PMH bladder cancer with cystectomy and ileal conduit, COPD, recent dx of afib, moderate-severe TR, CAD (high calcification score), T2DM presented to [facility] with projectile vomiting and abdominal pain, found to have SBO with transition point at level of ileostomy on CT A/P. On initial presentation, labs remarkable for leukocytosis thought to be iso hemoconcentration, AKI on CKD, mildly elevated venous lactate improved with resuscitation. Admitted to [facility] for worsening hypoxia on HFNC requiring intubation in setting of hiatal hernia. OR on 02/28/24 for ex-lap, reduciton of parastomal hernia, small bowel resection with primary anastomosis and lysis of adhesions.      Hospital Course:  - 02/26/24: Admitted to [facility], floor status, consult to urology for possible ileal conduit revision.   - 02/27/24: Patient level of care escalated to step-down iso increased oxygen needs while in ED.  - 02/28/24: Upgraded to ICU. Intubated at bed-side. OR for ex-lap, reduction of parastomal hernia, small bowel resection with primary anastomosis, lysis of adhesions, abdomen left open with ABThera in place.   - 03/01/24: OR for attempt at abdominal closure. Left open due to desaturations during closure attempt. ABThera in place.  - 03/03/24: RTOR for abdominal closure  -03/06/24: Vas cath placed, CRRT started   -03/09/24: Extubated to HFNC  -03/10/24: CRRT discontinued   - 03/11/24: Re-intubated for hypoxic respiratory failure 2/2 mucus plug   - 03/14/24 Extubated to HFNC   -03/15/24: Vas cath removed      ASSESSMENT & PLAN:     Neurologic:  - Pain: tylenol and robaxin SCH  - Discontinue PRN oxy and HM at family request    #schizoaffective disorder  - Monthly prolixin injection (given 3/14)    Cardiovascular:  - MAP goal > 65    #HFpEF  #Pulm HTN  - Off pressors  - ECHO 3/12- LVEF 70%, moderately dilated RV with moderate systolic dysfunction, severe pulmonary hypertension.    #Hx of HTN  -holding home amlodipine    #Hx of HLD  - atorvastatin    #Afib/flutter w RVR:   - Prior hx of paroxysmal afib. CHADSvasc = 6, no AC PTA per chart review.      > Holding home diltiazem, question efficacy given likely poor absorption  - PO Amio ineffective, continue IV formulation  - heparin gtt    #Pulmonary embolism  - 3/5 CTA chest: Filling defect in L anterior segmental artery  - heparin gtt     #Acute thrombus right basilic   - Identified 3/3 on BUE duplex  - Supportive care, superficial, AC not required     Respiratory:  #Acute on chronic hypoxemia  #Hx of COPD  - Aggressive pulmonary toilet  - Start Brovana, continue atrovent nebs    #  Aspiration  - Transition to Bipap from HFNC s/p aspiration event this evening.   - Will trial back on HFNC in morning    #Pseudomonas Pneumonia   #VAP  - see ID for abx plan    FEN/GI:  F: ML  E: Replete electrolytes PRN  N: NPO, restart trickle TF 3/17 - advance as tolerated --> held while on Bipap   - Renew TPN   - SLP consult on 3/17 - Ok for PO diet, start full liquids for now    - Aspirated on water this evening, strict NPO now  - GI prophylaxis: pepcid  - Bowel regimen: start miralax and senna   - Last BM: 3/16    #S/p ex-lap, reduction of parastomal hernia, small bowel resection with primary anastomosis, lysis of adhesions  -CT 3/13 demonstrating partial small bowel obstruction  -Blake drain#2 removed 3/16  - Blake drain #1 with (25/13) SS output.     #Large hiatal hernia, chronic  - re-eval on CT chest/abd/pelvis 3/13 stable    Renal/Genitourinary:  #AKI on CKD  -Nephrology signed off, HD cath removed  - Cr stable, cystatin c remains elevated (~1 higher than Scr)  - Diurese with IV lasix 40 mg q6h x2 doses, goal for net negative 1 to 1.5L   - Re-diuresed with additional 120mg  lasix, poor response in UOP     Intake/Output Summary (Last 24 hours) at 03/17/2024 0310  Last data filed at 03/17/2024 0200  Gross per 24 hour   Intake 2313.87 ml   Output 2425 ml   Net -111.13 ml        #H/o bladder cancer s/p radical cystectomy with ileal conduit (2012):  #Parastomal hernia:  - Strict I&Os  - Ok for no foley in stoma at this time per urology    Endocrine:   #hyperglycemia   - Glycemic Control: SSI, NPH 5 unit BID    Hematologic:   - Hgb stable  - Monitor CBC daily, transfuse for hgb 7mg /dL  - DVT ppx: heparin gtt for PE    Immunologic/Infectious Disease:  - Afebrile with resolving leukocytosis     - Cultures:   - Blood cultures (3/4): negative, final   - BAL (3/4): Pseudomonas aeruginosa - susceptible to meropenum    - BAL (3/8): Pseudomonas aeruginosa and candida glabrata  -3/12: Susceptibilities show resistance to Meropenum, Cefepime, Ceftaz and Zosyn.   - Bcx (3/11): Negative, final  - BAL (3/12): 600,000+ psuedomonas (susceptibilities: resistant to meropenum)  - Ucx (3/13): no growth    - Antimicrobials:    - Ceftriaxone 2/27- 2/28 - colonized UTI   - Zosyn (3/1 - 3/5) for abdominal contamination  - Zosyn (3/6) for pseudomonas when sensitivities remained pending  - Zerbaxa (3/12- ) for 7-10 day course, pending clinical course  - ID following, appreciate recs    Musculoskeletal:   #Deconditioned   - PT/OT consulted     Daily Care Checklist:   - Stress Ulcer Prevention: No  - DVT Prophylaxis: Chemical: Heparin drip  - Daily Awakening: Yes  - Spontaneous Breathing Trial: Yes  - Indication for Central/PICC Line: Yes  Infusions requiring central access, Hemodynamic monitoring, and Inadequate peripheral access, RIJ triple lumen  - Indication for Urinary Catheter: Yes  Strict intake and output, Agressive diuresis/hydration, and Urinary retention/obstruction  - Diagnostic images/reports of past 24hrs reviewed: Yes    Disposition:   - Continue ICU care  - PT/OT consulted:yes     SUBJECTIVE:    Alert and  appropriate. Follows commands. Denies pain. Asking for ice chips and to not take heart medications anymore.      OBJECTIVE:     Physical Exam:  Constitutional: Sitting in bed, in moderate respiratory distress  Neurologic: Opens eyes to voice. Nods yes/no appropriately. Pupils equal and responsive to light.   Respiratory: Tachypnea ~40RR, coarse breath sounds L>R, on Bipap, desats to 70s w/ removal   Cardiovascular:  Irregularly irregular rhythm ~120bpm, +2 pulses in all extremities   Gastrointestinal: Soft abdomen, midline incision staples in tact. Urostomy draining clear yellow urine. Surgical drainx1 with scant SS output  Musculoskeletal: Trace BLE/BUE edema.  Skin: Warm and dry     Temp:  [36.5 ??C (97.7 ??F)-36.7 ??C (98 ??F)] 36.6 ??C (97.8 ??F)  Heart Rate:  [97-132] 114  SpO2 Pulse:  [92-129] 111  Resp:  [14-31] 22  BP: (97-170)/(54-101) 120/95  MAP (mmHg):  [69-108] 100  A BP-1: (91-154)/(59-93) 147/74  MAP:  [76 mmHg-202 mmHg] 182 mmHg  FiO2 (%):  [30 %-60 %] 50 %  SpO2:  [86 %-100 %] 100 %     Recent Laboratory Results:  Recent Labs     Units 03/16/24  0424   PHART  7.36   PCO2ART mm Hg 38.8   PO2ART mm Hg 68.3*   HCO3ART mmol/L 21*   BEART  -3.5*   O2SATART % 95.2       Recent Labs     Units 03/15/24  2020 03/16/24  0424 03/16/24  1558   NA mmol/L 141 143 - 142 144   K mmol/L 4.0 4.0 - 3.9 4.1   CL mmol/L 106 107 111*   CO2 mmol/L 23.0 22.0 22.0   BUN mg/dL 44* 47* 51*   CREATININE mg/dL 4.54* 0.98* 1.19*   GLU mg/dL 147* 829* 562* Lab Results   Component Value Date    BILITOT 0.3 03/15/2024    BILITOT 0.4 03/14/2024    BILIDIR 0.20 03/15/2024    BILIDIR 0.20 03/14/2024    ALT 10 03/15/2024    ALT 11 03/14/2024    AST 13 03/15/2024    AST 13 03/14/2024    GGT 16 09/17/2011    GGT 354 (H) 05/03/2011    ALKPHOS 60 03/15/2024    ALKPHOS 58 03/14/2024    PROT 5.7 03/15/2024    PROT 5.4 (L) 03/14/2024    ALBUMIN 1.9 (L) 03/15/2024    ALBUMIN 1.8 (L) 03/14/2024     Recent Labs     Units 03/16/24  0554 03/16/24  1135 03/16/24  1733 03/17/24  0038   POCGLU mg/dL 130* 865* 784* 696*     Recent Labs     Units 03/14/24  0407 03/15/24  0332 03/16/24  0424   WBC 10*9/L 10.9 10.6 8.9   RBC 10*12/L 2.78* 2.79* 2.73*   HGB g/dL 7.5* 7.8* 7.6*   HCT % 23.5* 23.7* 23.2*   MCV fL 84.5 85.0 85.0   MCH pg 26.9 28.1 27.6   MCHC g/dL 29.5* 28.4 13.2   RDW % 18.0* 18.2* 17.8*   PLT 10*9/L 216 243 240   MPV fL 9.5 9.7 9.9     Recent Labs     Units 03/16/24  0424   APTT sec 75.6*      Lines & Tubes:   Patient Lines/Drains/Airways Status       Active Peripheral & Central Intravenous Access       Name Placement date Placement time Site Days    Peripheral  IV 03/04/24 Anterior;Proximal;Right Forearm 03/04/24  0021  Forearm  13    CVC Triple Lumen 03/07/24 Non-tunneled Left Internal jugular 03/07/24  0849  Internal jugular  9                     Patient Lines/Drains/Airways Status       Active Wounds       Name Placement date Placement time Site Days    Surgical Site 04/08/18 Shoulder Left 04/08/18  1338  -- 2169    Surgical Site 06/05/22 Eye Left 06/05/22  0928  -- 650    Surgical Site 06/19/22 Eye Right 06/19/22  1024  -- 636    Surgical Site 03/03/24 Abdomen 03/03/24  1013  -- 13    Wound 06/24/20 Other (comment) Buttocks inner right buttock 06/24/20  1010  Buttocks  1361    Wound 03/11/24 Other (comment) Sacrum Mid moisture/friction injury 03/11/24  1200  Sacrum  5    Wound 03/12/24 Other (comment) Groin Right 03/12/24  1500  Groin  4    Wound 03/13/24 Neck Right open area note arount the HD catheter durng the dressing change 03/13/24  0930  Neck  3                     Respiratory/ventilator settings for last 24 hours:   FiO2 (%): 50 %  S RR: 18    Intake/Output last 3 shifts:  I/O last 3 completed shifts:  In: 2520.7 [P.O.:10; I.V.:512.3; NG/GT:300; IV Piggyback:6.3]  Out: 3798 [Urine:3235; Emesis/NG output:500; Drains:63]    Daily/Recent Weight:  72 kg (158 lb 11.7 oz)    BMI:  Body mass index is 30.01 kg/m??.    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    Chronic kidney disease     Depression, psychotic (CMS-HCC)     Diabetes mellitus (CMS-HCC)     in past    Emphysema of lung (CMS-HCC)     Financial difficulties     Frail elderly     Hearing impairment     Hernia     History of transfusion     Hyperlipidemia     Hypertension     Impaired mobility     Osteoporosis     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/08/2015    Visual impairment      Past Surgical History:   Procedure Laterality Date    ABDOMINAL SURGERY      BLADDER SURGERY      BREAST CYST EXCISION      CHEMOTHERAPY  2012    bladder    GALLBLADDER SURGERY      stone removal    ILEOSTOMY  2012    PR COLONOSCOPY FLX DX W/COLLJ SPEC WHEN PFRMD N/A 02/09/2015    Procedure: COLONOSCOPY, FLEXIBLE, PROXIMAL TO SPLENIC FLEXURE; DIAGNOSTIC, W/WO COLLECTION SPECIMEN BY BRUSH OR WASH;  Surgeon: Dewaine Conger, MD;  Location: [facility] [place];  Service: Gastroenterology    PR EXPLORATORY OF ABDOMEN N/A 02/28/2024    Procedure: EXPLORATORY LAPAROTOMY, EXPLORATORY CELIOTOMY WITH OR WITHOUT BIOPSY(S);  Surgeon: Renda Rolls, MD;  Location: [facility];  Service: General Surgery    PR RECONSTR TOTAL SHOULDER IMPLANT  Left 04/08/2018    Procedure: ARTHROPLASTY, GLENOHUMERAL JOINT; TOTAL SHOULDER(GLENOID & PROXIMAL HUMERAL REPLACEMENT(EG, TOTAL SHOULDER);  Surgeon: Tomasa Rand, MD;  Location: [facility] OR [facility];  Service: Ortho Sports Medicine    PR REOPEN RECENT ABD EXPLORATORY Midline 03/01/2024    Procedure: REOPENING OF RECENT LAPAROTOMY;  Surgeon: [name]., MD;  Location: OR [facility];  Service: Trauma    PR REOPEN RECENT ABD EXPLORATORY N/A 03/03/2024    Procedure: REOPENING OF RECENT LAPAROTOMY;  Surgeon: Joanie Coddington, MD;  Location: OR [facility];  Service: Trauma    PR SIGMOIDOSCOPY,BIOPSY N/A 03/11/2015    Procedure: SIGMOIDOSCOPY, FLEXIBLE; WITH BIOPSY, SINGLE OR MULTIPLE;  Surgeon: Wilburt Finlay, MD;  [facility];  Service: Gastroenterology    PR XCAPSL CTRC RMVL INSJ IO LENS PROSTH W/O ECP Left 06/05/2022    Procedure: EXTRACAPSULAR CATARACT REMOVAL W/INSERTION OF INTRAOCULAR LENS PROSTHESIS, MANUAL OR MECHANICAL TECHNIQUE WITHOUT ENDOSCOPIC CYCLOPHOTOCOAGULATION;  Surgeon: Garner Gavel, MD;  Location: [facility] OR [facility];  Service: Ophthalmology    PR XCAPSL CTRC RMVL INSJ IO LENS PROSTH W/O ECP Right 06/19/2022    Procedure: EXTRACAPSULAR CATARACT REMOVAL W/INSERTION OF INTRAOCULAR LENS PROSTHESIS, MANUAL OR MECHANICAL TECHNIQUE WITHOUT ENDOSCOPIC CYCLOPHOTOCOAGULATION;  Surgeon: Garner Gavel, MD;  Location: [facility] OR [facility];  Service: Ophthalmology     Scheduled Medications:   acetaminophen  1,000 mg Intravenous Q8H    amiodarone  200 mg Enteral tube: gastric Daily    arformoterol  15 mcg Nebulization BID (RT)    aspirin  81 mg Enteral tube: gastric Daily    atorvastatin  40 mg Enteral tube: gastric Daily    carboxymethylcellulose sodium  2 drop Both Eyes TID    ceftolozane-tazobactam  750 mg Intravenous Q8H    flu vacc [id number](90yr up)-PF  0.5 mL Intramuscular During hospitalization    fluPHENAZine decanoate  50 mg Intramuscular Q30 Days    insulin NPH  5 Units Subcutaneous BID AC    insulin regular  0-20 Units Subcutaneous Q6H SCH    ipratropium  500 mcg Nebulization Q6H (RT)    pantoprazole (Protonix) intravenous solution  40 mg Intravenous Daily    polyethylene glycol  17 g Enteral tube: gastric Daily    sennosides  2.5 mL Enteral tube: gastric Nightly    sodium chloride  10 mL Intravenous Q8H    sodium chloride  10 mL Intravenous Q8H    sodium chloride  10 mL Intravenous Q8H    sodium chloride  4 mL Nebulization Q6H (RT)     Continuous Infusions:   heparin 18 Units/kg/hr (03/17/24 0200)    NxStage RFP 400 (+/- BB) 5000 mL - contains 2 mEq/L of potassium      NxStage/multiBic RFP 401 (+/- BB) 5000 mL - contains 4 mEq/L of potassium      Parenteral Nutrition (CENTRAL) 40 mL/hr at 03/17/24 0200     PRN Medications:  albuterol, dextrose in water, heparin (porcine), heparin (porcine), nicotine polacrilex **OR** nicotine polacrilex, ondansetron    Tammy Braun is critically ill due to acute kidney injury, altered mental status, atrial fibrillation, pneumonia, pulmonary embolism. This critical care time includes examining the patient, evaluating the hemodynamic, laboratory, and radiographic data, independently developing a comprehensive management plan, and serially assessing the patient's response to these critical care interventions. This critical care time excludes procedures.    Critical care time: 90 minutes     Clide Cliff, NP   Surgical Intensive Care Unit  [employer] [facility]

## 2024-03-17 NOTE — Unmapped (Signed)
 Tammy Braun diet had been advanced to full liquids after evaluation by SLP earlier in the day. Early this evening, the bedside RN was in the room while Tammy Braun was drinking water when she began to cough and gurgle on the water, c/f an aspiration event. The bedside RN removed the liquid, suctioned her, paused tube feeds, and notified [facility] providers promptly. She then quickly increased her oxygen requirements from HFNC 30L, 30% to 60L, 80%, sats around ~92%. She was made strict NPO, TF were held, and she was placed on Bipap with improvement in her respiratory effort. CXR was obtained demonstrating worsening opacification of the left lung when compared to the day prior.    Providers discussed the potential need for re-intubation with Tammy Braun at this time, who agreed to reintubation but was adamant that she did not want a tracheostomy - at this time, she was AOx4, but only able to nod yes/no to questions due to being on Bipap and not tolerating removal for prolonged discussions due to desaturations. Risks and benefits of intubation and tracheostomy were discussed and Tammy Braun repeatedly nodded yes to being intubated, and shook her head no to a tracheostomy, further more pointing to her neck and shaking her head no.     Her daughter, Tammy Braun, was called by providers to discuss potential need for intubation if Tammy Braun did not improve with Bipap. Tammy Braun arrived to [facility] approximately 15 minutes later and was provided an update by the [facility] team. Tammy Braun was also in agreement with re-intubation, if necessary, but would not allow the [facility] team to fully discuss tracheostomy with her mother, stating that we aren't there yet and repeatedly interrupting our team's attempts to discuss tracheostomy with Tammy Braun. Furthermore, she told our team that she needed to be the one to discuss the tracheostomy with her mother and that she (Tammy Braun) would let the team know her mother's decision. We reinforced with Tammy Braun that we would like to have this conversation with Tammy Braun while she is still able to participate in discussions regarding her care to best understand her wishes. During this conversation, which was held in Tammy Braun presence, Tammy Braun continued to shake her head no when tracheostomy was mentioned while continuing to point to her throat. Tammy Braun told her mother that she could trust that she would make the best decision for her.    After providing some time for privacy, [facility] providers returned and Tammy Braun told us that she did not discuss the potential need for tracheostomy with her mother. We attempted to have this discussion again, and Tammy Braun stated that her mother would do what she (Tammy Braun) thought was best, and that she (Tammy Braun) would only be agreeable to the tracheostomy if her life depended on it. Tammy Braun continued to state that she had spoken with the hospital chaplain who was going to provide them with advanced directive papers so that everything is documented. She continued on to say that she wanted someone who is competent and knowledgeable to discuss her advanced care planning and tracheostomy and that we don't need to have these discussions now. We reinforced that this will be Tammy Braun's second failed extubation and we would like to understand her long-term wishes regarding her healthcare.    Ultimately, Tammy Braun remains a high-risk for reintubation and is currently on Bipap. I discussed with Tammy Braun, in her daughter's presence, that we would attempt to remove her from Bipap in the  morning and transition her to HFNC so that she may verbally participate in her goals of care discussions. These conversations were had in the presence of 3 [facility]U providers and bedside nursing.

## 2024-03-17 NOTE — Unmapped (Signed)
 Pt received at beginning of shift on HFNC 30L/30%. Pt started to desat around 2000 and HFNC was increased to 40L/40% then 50L/50%. Sats would come up to 90-91% and then begin to drop to low 80's. Placed pt on BIPAP 12/05/49% with leak of 38. Sats 97%. RT will continue to monitor.

## 2024-03-17 NOTE — Unmapped (Signed)
 ADVANCE CARE PLANNING NOTE    Discussion Date:  March 17, 2024    Patient has decisional capacity:  Yes    Patient has selected a Health Care Decision-Maker if loses capacity: Yes    Health Care Decision Maker as of 03/17/2024    HCDM (patient stated preference) (Active): Tammy Braun - Daughter - [phone]    Discussion Participants:  Pascal Lux, Rolanda Lundborg, NP and Tammy Braun    Communication of Medical Status/Prognosis:   Communicated that the medical team is highly concerned that the patient will require re-intubation 2/2 increased WOB ISO aspiration, pneumonia, mucus plug and generalized deconditioning.     Communication of Treatment Goals/Options:   Given that this would be the second time the patient has failed extubation- primary team would recommend a tracheostomy for airway protection if that aligns with the patient's goals and is a quality of life that would be acceptable to her. In addition, given recurrent failure to feed and high aspiration risk in setting of large hiatal hernia, we would recommend a Jtube at the time of tracheostomy.     Treatment Decisions:   Discussions ongoing over the day with multiple team members and consult services (palliative care). At this time the patient is not in acute distress and does not require intubation. Continue treating pneumonia with current abx regimen, thinning secretions with hypertonic saline nebs, bipap and hfnc for respiratory support and aggressive pulmonary toileting for airway clearance.  If patient does deteriorate clinically, the patient and the HCDM would like to proceed with intubation.     Tammy Braun expressed a desire for a pulmonary consult and that she believes all of these medical issues would resolve if her mother's pSBO was addressed surgically. This NP expressed understanding of her wanting to do everything she could to give her mother the best shot at recovery as possible. Tammy Braun was assured that this NP would pass on pulmonologist consult request to attending on service and speak with fellow on service to further discuss surgical anatomy and indications with her. Explained that, as the medical team, we felt that at this time the patient was not obstructed and the primary problem is not a primary lung issue. Instead, an issue of profound deconditioning 2/2 ICU status for 20 days, infection and mechanism of hiatal hernia increasing aspiration risk.     At this time, no decision has been made on tracheostomy or Jtube. Tammy Braun states I would not want to do something to change my mother's quality of life, if we can go and solve these issues with surgery. I know that it would be a while before she could eat normally again but that is what we want.    Patient continues to express desire to go home and continues to ask repeatedly for water to drink.           I spent 60 minutes providing voluntary advance care planning services for this patient.    Tammy Braun, ACNP  [employer] [place]

## 2024-03-18 LAB — CBC
HEMATOCRIT: 24.7 % — ABNORMAL LOW (ref 34.0–44.0)
HEMOGLOBIN: 7.8 g/dL — ABNORMAL LOW (ref 11.3–14.9)
MEAN CORPUSCULAR HEMOGLOBIN CONC: 31.7 g/dL — ABNORMAL LOW (ref 32.0–36.0)
MEAN CORPUSCULAR HEMOGLOBIN: 26.9 pg (ref 25.9–32.4)
MEAN CORPUSCULAR VOLUME: 85 fL (ref 77.6–95.7)
MEAN PLATELET VOLUME: 9.6 fL (ref 6.8–10.7)
PLATELET COUNT: 256 10*9/L (ref 150–450)
RED BLOOD CELL COUNT: 2.9 10*12/L — ABNORMAL LOW (ref 3.95–5.13)
RED CELL DISTRIBUTION WIDTH: 17.4 % — ABNORMAL HIGH (ref 12.2–15.2)
WBC ADJUSTED: 14 10*9/L — ABNORMAL HIGH (ref 3.6–11.2)

## 2024-03-18 LAB — BASIC METABOLIC PANEL
ANION GAP: 7 mmol/L (ref 5–14)
ANION GAP: 10 mmol/L (ref 5–14)
BLOOD UREA NITROGEN: 60 mg/dL — ABNORMAL HIGH (ref 9–23)
BLOOD UREA NITROGEN: 59 mg/dL — ABNORMAL HIGH (ref 9–23)
BUN / CREAT RATIO: 50
BUN / CREAT RATIO: 49
CALCIUM: 9 mg/dL (ref 8.7–10.4)
CALCIUM: 9.3 mg/dL (ref 8.7–10.4)
CHLORIDE: 109 mmol/L — ABNORMAL HIGH (ref 98–107)
CHLORIDE: 109 mmol/L — ABNORMAL HIGH (ref 98–107)
CO2: 26 mmol/L (ref 20.0–31.0)
CO2: 25 mmol/L (ref 20.0–31.0)
CREATININE: 1.19 mg/dL — ABNORMAL HIGH (ref 0.55–1.02)
CREATININE: 1.2 mg/dL — ABNORMAL HIGH (ref 0.55–1.02)
EGFR CKD-EPI (2021) FEMALE: 46 mL/min/{1.73_m2} — ABNORMAL LOW (ref >=60)
EGFR CKD-EPI (2021) FEMALE: 46 mL/min/{1.73_m2} — ABNORMAL LOW (ref >=60)
GLUCOSE RANDOM: 207 mg/dL — ABNORMAL HIGH (ref 70–179)
GLUCOSE RANDOM: 112 mg/dL (ref 70–179)
POTASSIUM: 3.8 mmol/L (ref 3.4–4.8)
POTASSIUM: 3.7 mmol/L (ref 3.4–4.8)
SODIUM: 142 mmol/L (ref 135–145)
SODIUM: 144 mmol/L (ref 135–145)

## 2024-03-18 LAB — APTT
APTT: 87.2 s — ABNORMAL HIGH (ref 24.8–38.4)
HEPARIN CORRELATION: 0.5

## 2024-03-18 LAB — PHOSPHORUS
PHOSPHORUS: 3.4 mg/dL (ref 2.4–5.1)
PHOSPHORUS: 3.5 mg/dL (ref 2.4–5.1)

## 2024-03-18 LAB — MAGNESIUM
MAGNESIUM: 2 mg/dL (ref 1.6–2.6)
MAGNESIUM: 1.9 mg/dL (ref 1.6–2.6)

## 2024-03-18 MED ADMIN — heparin 25,000 Units/250 mL (100 units/mL) in 0.45% saline infusion (premade): 0-24 [IU]/kg/h | INTRAVENOUS | @ 13:00:00

## 2024-03-18 MED ADMIN — sodium chloride (NS) 0.9 % flush 10 mL: 10 mL | INTRAVENOUS | @ 19:00:00

## 2024-03-18 MED ADMIN — ceftolozane-tazobactam (ZERBAXA) 750 mg in sodium chloride (NS) 0.9 % 100 mL IVPB: 750 mg | INTRAVENOUS | @ 19:00:00 | Stop: 2024-03-20

## 2024-03-18 MED ADMIN — furosemide (LASIX) injection 80 mg: 80 mg | INTRAVENOUS | @ 06:00:00 | Stop: 2024-03-18

## 2024-03-18 MED ADMIN — ceftolozane-tazobactam (ZERBAXA) 750 mg in sodium chloride (NS) 0.9 % 100 mL IVPB: 750 mg | INTRAVENOUS | @ 14:00:00 | Stop: 2024-03-20

## 2024-03-18 MED ADMIN — sodium chloride 3 % NEBULIZER solution 4 mL: 4 mL | RESPIRATORY_TRACT | @ 14:00:00

## 2024-03-18 MED ADMIN — acetaminophen (OFIRMEV) 10 mg/mL injection 1,000 mg: 1000 mg | INTRAVENOUS | @ 13:00:00 | Stop: 2024-03-18

## 2024-03-18 MED ADMIN — HYDROmorphone (PF) (DILAUDID) injection Syrg 0.1 mg: .1 mg | INTRAVENOUS | @ 03:00:00 | Stop: 2024-03-17

## 2024-03-18 MED ADMIN — acetaminophen (OFIRMEV) 10 mg/mL injection 1,000 mg: 1000 mg | INTRAVENOUS | @ 21:00:00 | Stop: 2024-03-19

## 2024-03-18 MED ADMIN — furosemide (LASIX) injection 80 mg: 80 mg | INTRAVENOUS | @ 19:00:00 | Stop: 2024-03-18

## 2024-03-18 MED ADMIN — ipratropium (ATROVENT) 0.02 % nebulizer solution 500 mcg: 500 ug | RESPIRATORY_TRACT | @ 19:00:00

## 2024-03-18 MED ADMIN — sodium chloride (NS) 0.9 % flush 10 mL: 10 mL | INTRAVENOUS | @ 09:00:00

## 2024-03-18 MED ADMIN — amiodarone (PACERONE) tablet 200 mg: 200 mg | GASTROENTERAL | @ 13:00:00

## 2024-03-18 MED ADMIN — sodium chloride (NS) 0.9 % flush 10 mL: 10 mL | INTRAVENOUS | @ 02:00:00

## 2024-03-18 MED ADMIN — arformoterol (BROVANA) nebulizer solution 15 mcg/2 mL: 15 ug | RESPIRATORY_TRACT | @ 14:00:00

## 2024-03-18 MED ADMIN — ipratropium (ATROVENT) 0.02 % nebulizer solution 500 mcg: 500 ug | RESPIRATORY_TRACT | @ 02:00:00

## 2024-03-18 MED ADMIN — insulin regular (HumuLIN,NovoLIN) injection 0-20 Units: 0-20 [IU] | SUBCUTANEOUS | @ 10:00:00

## 2024-03-18 MED ADMIN — carboxymethylcellulose sodium (THERATEARS) 0.25 % ophthalmic solution 2 drop: 2 [drp] | OPHTHALMIC | @ 19:00:00

## 2024-03-18 MED ADMIN — acetaminophen (OFIRMEV) 10 mg/mL injection 1,000 mg: 1000 mg | INTRAVENOUS | @ 03:00:00 | Stop: 2024-03-18

## 2024-03-18 MED ADMIN — sodium chloride 3 % NEBULIZER solution 4 mL: 4 mL | RESPIRATORY_TRACT | @ 08:00:00

## 2024-03-18 MED ADMIN — sodium chloride 3 % NEBULIZER solution 4 mL: 4 mL | RESPIRATORY_TRACT | @ 19:00:00

## 2024-03-18 MED ADMIN — aspirin chewable tablet 81 mg: 81 mg | GASTROENTERAL | @ 13:00:00

## 2024-03-18 MED ADMIN — carboxymethylcellulose sodium (THERATEARS) 0.25 % ophthalmic solution 2 drop: 2 [drp] | OPHTHALMIC

## 2024-03-18 MED ADMIN — pantoprazole (Protonix) injection 40 mg: 40 mg | INTRAVENOUS | @ 13:00:00

## 2024-03-18 MED ADMIN — atorvastatin (LIPITOR) tablet 40 mg: 40 mg | GASTROENTERAL | @ 13:00:00

## 2024-03-18 MED ADMIN — insulin NPH (HumuLIN,NovoLIN) injection 5 Units: 5 [IU] | SUBCUTANEOUS | @ 14:00:00 | Stop: 2024-03-18

## 2024-03-18 MED ADMIN — sennosides (SENOKOT) oral syrup: 2.5 mL | GASTROENTERAL

## 2024-03-18 MED ADMIN — Parenteral Nutrition (CENTRAL): INTRAVENOUS | @ 02:00:00 | Stop: 2024-03-18

## 2024-03-18 MED ADMIN — polyethylene glycol (MIRALAX) packet 17 g: 17 g | GASTROENTERAL | @ 13:00:00

## 2024-03-18 MED ADMIN — arformoterol (BROVANA) nebulizer solution 15 mcg/2 mL: 15 ug | RESPIRATORY_TRACT | @ 02:00:00

## 2024-03-18 MED ADMIN — insulin regular (HumuLIN,NovoLIN) injection 0-20 Units: 0-20 [IU] | SUBCUTANEOUS | @ 16:00:00

## 2024-03-18 MED ADMIN — sodium chloride 3 % NEBULIZER solution 4 mL: 4 mL | RESPIRATORY_TRACT | @ 02:00:00

## 2024-03-18 MED ADMIN — fat emulsion 20 % with fish oil (SMOFlipid) infusion 250 mL: 250 mL | INTRAVENOUS | @ 02:00:00 | Stop: 2024-03-18

## 2024-03-18 MED ADMIN — carboxymethylcellulose sodium (THERATEARS) 0.25 % ophthalmic solution 2 drop: 2 [drp] | OPHTHALMIC | @ 13:00:00

## 2024-03-18 MED ADMIN — ipratropium (ATROVENT) 0.02 % nebulizer solution 500 mcg: 500 ug | RESPIRATORY_TRACT | @ 14:00:00

## 2024-03-18 MED ADMIN — ceftolozane-tazobactam (ZERBAXA) 750 mg in sodium chloride (NS) 0.9 % 100 mL IVPB: 750 mg | INTRAVENOUS | @ 03:00:00 | Stop: 2024-03-20

## 2024-03-18 MED ADMIN — ipratropium (ATROVENT) 0.02 % nebulizer solution 500 mcg: 500 ug | RESPIRATORY_TRACT | @ 08:00:00

## 2024-03-18 MED ADMIN — insulin regular (HumuLIN,NovoLIN) injection 0-20 Units: 0-20 [IU] | SUBCUTANEOUS | @ 04:00:00

## 2024-03-18 MED ADMIN — furosemide (LASIX) injection 80 mg: 80 mg | INTRAVENOUS | @ 13:00:00 | Stop: 2024-03-18

## 2024-03-18 MED ADMIN — phenol (CHLORASEPTIC) 1.4 % spray 2 spray: 2 | @ 06:00:00

## 2024-03-18 NOTE — Unmapped (Signed)
 [facility] PROGRESS NOTE     Date of Service: 03/18/2024    Hospital Day: LOS: 20 days        Surgery Date: TBD   Surgical Attending: Joanie Coddington, MD    Critical Care Attending: Lia Hopping, MD    Interval History:   Continued on HFNC overnight, weaning O2 requirements as tolerated. Patient was not intubated, ongoing conversations with family regarding GOC. Net negative 1.4L, did not further diurese overnight.    History of Present Illness:   Tammy Braun is a 83 y.o. female with PMH bladder cancer with cystectomy and ileal conduit, COPD, recent dx of afib, moderate-severe TR, CAD (high calcification score), T2DM presented to [facility] with projectile vomiting and abdominal pain, found to have SBO with transition point at level of ileostomy on CT A/P. On initial presentation, labs remarkable for leukocytosis thought to be iso hemoconcentration, AKI on CKD, mildly elevated venous lactate improved with resuscitation. Admitted to [facility] for worsening hypoxia on HFNC requiring intubation in setting of hiatal hernia. OR on 02/28/24 for ex-lap, reduciton of parastomal hernia, small bowel resection with primary anastomosis and lysis of adhesions.      Hospital Course:  - 02/26/24: Admitted to [facility], floor status, consult to urology for possible ileal conduit revision.   - 02/27/24: Patient level of care escalated to step-down iso increased oxygen needs while in ED.  - 02/28/24: Upgraded to ICU. Intubated at bed-side. OR for ex-lap, reduction of parastomal hernia, small bowel resection with primary anastomosis, lysis of adhesions, abdomen left open with ABThera in place.   - 03/01/24: OR for attempt at abdominal closure. Left open due to desaturations during closure attempt. ABThera in place.  - 03/03/24: RTOR for abdominal closure  -03/06/24: Vas cath placed, CRRT started   -03/09/24: Extubated to HFNC  -03/10/24: CRRT discontinued   - 03/11/24: Re-intubated for hypoxic respiratory failure 2/2 mucus plug   - 03/14/24 Extubated to HFNC   -03/15/24: Vas cath removed   -03/16/24: Placed on BiPap for complete collapse of L lung 2/2 probable mucus plugging     ASSESSMENT & PLAN:     Neurologic:  - Pain: tylenol and robaxin SCH  - Discontinue PRN oxy and HM at family request    #schizoaffective disorder  - Monthly prolixin injection (given 3/14)    Cardiovascular:  - MAP goal > 65    #HFpEF  #Pulm HTN  - Off pressors  - ECHO 3/12- LVEF 70%, moderately dilated RV with moderate systolic dysfunction, severe pulmonary hypertension.    #Hx of HTN  -holding home amlodipine    #Hx of HLD  - atorvastatin    #Afib/flutter w RVR:   - Prior hx of paroxysmal afib. CHADSvasc = 6, no AC PTA per chart review.      > Holding home diltiazem, question efficacy given likely poor absorption  - Amio via NG tube 200mg  daily   - heparin gtt for [facility]     #Pulmonary embolism  - 3/5 CTA chest: Filling defect in L anterior segmental artery  - heparin gtt     #Acute thrombus right basilic   - Identified 3/3 on BUE duplex  - Supportive care, superficial, AC not required     Respiratory:  #Acute on chronic hypoxemia  #Hx of COPD  - Aggressive pulmonary toilet  - Start Brovana, continue atrovent nebs  - Continue hypertonic saline nebs for clearance     #Aspiration  - Continue Bipap, ok to break on  HFNC for short breaks, comfort, participation in conversions s/p aspiration event 3/17.   - ABG stable  - High risk for re-intubation and bronchoscopy     #Pseudomonas Pneumonia   #VAP  - see ID for abx plan    FEN/GI:  F: ML  E: Replete electrolytes PRN  N: NPO, TF held    - Renew TPN   - SLP consult on 3/17 - Ok for PO diet, continue strict NPO  - GI prophylaxis: pepcid  - Bowel regimen: cont miralax and senna   - Last BM: 3/17    #S/p ex-lap, reduction of parastomal hernia, small bowel resection with primary anastomosis, lysis of adhesions  -CT 3/13 demonstrating partial small bowel obstruction  -Blake drain#2 removed 3/16  - Blake drain #1 removed 3/17    #Large hiatal hernia, chronic  - re-eval on CT chest/abd/pelvis 3/13 stable    Renal/Genitourinary:  #AKI on CKD  -Nephrology signed off, HD cath removed  - Cr stable, cystatin c remains elevated (~1 higher than Scr)  - Diurese with IV lasix 80 mg q8h x2 doses, met net negative goal        #H/o bladder cancer s/p radical cystectomy with ileal conduit (2012):  #Parastomal hernia:  - Strict I&Os  - Ok for no foley in stoma at this time per urology    Endocrine:   #hyperglycemia   - Glycemic Control: SSI, NPH 5 unit BID    Hematologic:   - Hgb stable  - Monitor CBC daily, transfuse for hgb 7mg /dL  - DVT ppx: heparin gtt for PE    Immunologic/Infectious Disease:  - Afebrile with resolving leukocytosis     - Cultures:   - Blood cultures (3/4): negative, final   - BAL (3/4): Pseudomonas aeruginosa - susceptible to meropenum    - BAL (3/8): Pseudomonas aeruginosa and candida glabrata  -3/12: Susceptibilities show resistance to Meropenum, Cefepime, Ceftaz and Zosyn.   - Bcx (3/11): Negative, final  - BAL (3/12): 600,000+ psuedomonas (susceptibilities: resistant to meropenum)  - Ucx (3/13): no growth    - Antimicrobials:    - Ceftriaxone 2/27- 2/28 - colonized UTI   - Zosyn (3/1 - 3/5) for abdominal contamination  - Zosyn (3/6) for pseudomonas when sensitivities remained pending  - Zerbaxa (3/12-3/21 ) for 10 day course  - ID following, appreciate recs    Musculoskeletal:   #Deconditioned   - PT/OT consulted     Daily Care Checklist:   - Stress Ulcer Prevention: No  - DVT Prophylaxis: Chemical: Heparin drip  - Daily Awakening: Yes  - Spontaneous Breathing Trial: Yes  - Indication for Central/PICC Line: Yes  Infusions requiring central access, Hemodynamic monitoring, and Inadequate peripheral access, LIJ triple lumen  - Indication for Urinary Catheter: No   - Diagnostic images/reports of past 24hrs reviewed: Yes    Disposition:   - Continue ICU care  - PT/OT consulted:yes     SUBJECTIVE:    Alert and appropriate. Follows commands. Denies pain. States that she does not want tracheostomy      OBJECTIVE:     Physical Exam:  Constitutional: Sitting in bed, in moderate respiratory distress  Neurologic: Opens eyes to voice. Nods yes/no appropriately. Pupils equal and responsive to light.   Respiratory: Tachypnea ~40RR, coarse breath sounds L>R, on Bipap, desats to 70s w/ removal   Cardiovascular:  Irregularly irregular rhythm ~120bpm, +2 pulses in all extremities   Gastrointestinal: Soft abdomen, midline incision staples in tact. Urostomy draining clear  yellow urine. Surgical drainx1 with scant SS output  Musculoskeletal: Trace BLE/BUE edema.  Skin: Warm and dry     Temp:  [36.8 ??C (98.2 ??F)-36.9 ??C (98.4 ??F)] 36.8 ??C (98.2 ??F)  Heart Rate:  [96-132] 132  SpO2 Pulse:  [101-126] 125  Resp:  [16-30] 24  BP: (92-149)/(57-77) 108/73  MAP (mmHg):  [64-91] 85  FiO2 (%):  [40 %-80 %] 60 %  SpO2:  [94 %-100 %] 99 %     Recent Laboratory Results:  Recent Labs     Units 03/17/24  2335   PHART  7.33*   PCO2ART mm Hg 35.0   PO2ART mm Hg 153.0*   HCO3ART mmol/L 18*   BEART  -7.1*   O2SATART % 99.0         Recent Labs     Units 03/17/24  2026 03/17/24  2335 03/18/24  0325 03/18/24  1601   NA mmol/L 142 140 142 144   K mmol/L 4.1 3.8 3.8 3.7   CL mmol/L 105  --  109* 109*   CO2 mmol/L 25.0  --  26.0 25.0   BUN mg/dL 55*  --  60* 59*   CREATININE mg/dL 1.61*  --  0.96* 0.45*   GLU mg/dL 409*  --  811* 914     Lab Results   Component Value Date    BILITOT 0.3 03/15/2024    BILITOT 0.4 03/14/2024    BILIDIR 0.20 03/15/2024    BILIDIR 0.20 03/14/2024    ALT 10 03/15/2024    ALT 11 03/14/2024    AST 13 03/15/2024    AST 13 03/14/2024    GGT 16 09/17/2011    GGT 354 (H) 05/03/2011    ALKPHOS 60 03/15/2024    ALKPHOS 58 03/14/2024    PROT 5.7 03/15/2024    PROT 5.4 (L) 03/14/2024    ALBUMIN 1.9 (L) 03/15/2024    ALBUMIN 1.8 (L) 03/14/2024     Recent Labs     Units 03/18/24  0520 03/18/24  0953 03/18/24  1154 03/18/24  1656   POCGLU mg/dL 782 956* 213* 086     Recent Labs     Units 03/16/24  0424 03/17/24  0424 03/18/24  0325   WBC 10*9/L 8.9 11.9* 14.0*   RBC 10*12/L 2.73* 3.13* 2.90*   HGB g/dL 7.6* 8.4* 7.8*   HCT % 23.2* 26.8* 24.7*   MCV fL 85.0 85.7 85.0   MCH pg 27.6 26.7 26.9   MCHC g/dL 57.8 46.9* 62.9*   RDW % 17.8* 18.1* 17.4*   PLT 10*9/L 240 290 256   MPV fL 9.9 9.4 9.6     Recent Labs     Units 03/18/24  0325   APTT sec 87.2*      Lines & Tubes:   Patient Lines/Drains/Airways Status       Active Peripheral & Central Intravenous Access       Name Placement date Placement time Site Days    Peripheral IV 03/04/24 Anterior;Proximal;Right Forearm 03/04/24  0021  Forearm  14    CVC Triple Lumen 03/07/24 Non-tunneled Left Internal jugular 03/07/24  0849  Internal jugular  11                     Patient Lines/Drains/Airways Status       Active Wounds       Name Placement date Placement time Site Days    Surgical Site 04/08/18 Shoulder Left 04/08/18  1338  --  2171    Surgical Site 06/05/22 Eye Left 06/05/22  0928  -- 652    Surgical Site 06/19/22 Eye Right 06/19/22  1024  -- 638    Surgical Site 03/03/24 Abdomen 03/03/24  1013  -- 15    Wound 06/24/20 Other (comment) Buttocks inner right buttock 06/24/20  1010  Buttocks  1363    Wound 03/11/24 Other (comment) Sacrum Mid moisture/friction injury 03/11/24  1200  Sacrum  7    Wound 03/12/24 Other (comment) Groin Right 03/12/24  1500  Groin  6    Wound 03/13/24 Neck Right open area note arount the HD catheter durng the dressing change 03/13/24  0930  Neck  5                     Respiratory/ventilator settings for last 24 hours:   FiO2 (%): 60 %  S RR: 12    Intake/Output last 3 shifts:  I/O last 3 completed shifts:  In: 2699 [I.V.:413.9; NG/GT:170; IV Piggyback:404.7]  Out: 4425 [Urine:4225; Emesis/NG output:200]    Daily/Recent Weight:  73.9 kg (162 lb 14.7 oz)    BMI:  Body mass index is 30.8 kg/m??.    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    Chronic kidney disease     Depression, psychotic (CMS-HCC)     Diabetes mellitus (CMS-HCC)     in past    Emphysema of lung (CMS-HCC)     Financial difficulties     Frail elderly     Hearing impairment     Hernia     History of transfusion     Hyperlipidemia     Hypertension     Impaired mobility     Osteoporosis     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/08/2015    Visual impairment      Past Surgical History:   Procedure Laterality Date    ABDOMINAL SURGERY      BLADDER SURGERY      BREAST CYST EXCISION      CHEMOTHERAPY  2012    bladder    GALLBLADDER SURGERY      stone removal    ILEOSTOMY  2012    PR COLONOSCOPY FLX DX W/COLLJ SPEC WHEN PFRMD N/A 02/09/2015    Procedure: COLONOSCOPY, FLEXIBLE, PROXIMAL TO SPLENIC FLEXURE; DIAGNOSTIC, W/WO COLLECTION SPECIMEN BY BRUSH OR WASH;  Surgeon: Dewaine Conger, MD;  Location: [facility] [place];  Service: Gastroenterology    PR EXPLORATORY OF ABDOMEN N/A 02/28/2024    Procedure: EXPLORATORY LAPAROTOMY, EXPLORATORY CELIOTOMY WITH OR WITHOUT BIOPSY(S);  Surgeon: Renda Rolls, MD;  Location: [facility] OR [facility];  Service: General Surgery    PR RECONSTR TOTAL SHOULDER IMPLANT Left 04/08/2018    Procedure: ARTHROPLASTY, GLENOHUMERAL JOINT; TOTAL SHOULDER(GLENOID & PROXIMAL HUMERAL REPLACEMENT(EG, TOTAL SHOULDER);  Surgeon: Tomasa Rand, MD;  [facility]: [facility] OR [facility];  Service: Ortho Sports Medicine    PR REOPEN RECENT ABD EXPLORATORY Midline 03/01/2024    Procedure: REOPENING OF RECENT LAPAROTOMY;  Surgeon: [name]., MD;  Location: OR [facility];  Service: Trauma    PR REOPEN RECENT ABD EXPLORATORY N/A 03/03/2024    Procedure: REOPENING OF RECENT LAPAROTOMY;  Surgeon: Joanie Coddington, MD;  Location: OR  [facility];  Service: Trauma    PR SIGMOIDOSCOPY,BIOPSY N/A 03/11/2015    Procedure: SIGMOIDOSCOPY, FLEXIBLE; WITH BIOPSY, SINGLE OR MULTIPLE;  Surgeon: Wilburt Finlay, MD; Location: [facility];  Service: Gastroenterology    PR XCAPSL CTRC RMVL INSJ IO LENS PROSTH W/O ECP Left 06/05/2022    Procedure: EXTRACAPSULAR CATARACT REMOVAL W/INSERTION OF INTRAOCULAR LENS PROSTHESIS, MANUAL OR MECHANICAL TECHNIQUE WITHOUT ENDOSCOPIC CYCLOPHOTOCOAGULATION;  Surgeon: Garner Gavel, MD;  Location: [facility] OR [facility];  Service: Ophthalmology    PR XCAPSL CTRC RMVL INSJ IO LENS PROSTH W/O ECP Right 06/19/2022    Procedure: EXTRACAPSULAR CATARACT REMOVAL W/INSERTION OF INTRAOCULAR LENS PROSTHESIS, MANUAL OR MECHANICAL TECHNIQUE WITHOUT ENDOSCOPIC CYCLOPHOTOCOAGULATION;  Surgeon: Garner Gavel, MD;  Location: [facility] OR [facility];  Service: Ophthalmology     Scheduled Medications:   acetaminophen  1,000 mg Intravenous Q8H    amiodarone  200 mg Enteral tube: gastric Daily    arformoterol  15 mcg Nebulization BID (RT)    aspirin  81 mg Enteral tube: gastric Daily    atorvastatin  40 mg Enteral tube: gastric Daily    carboxymethylcellulose sodium  2 drop Both Eyes TID    ceftolozane-tazobactam  750 mg Intravenous Q8H    flu vacc [id number](74yr up)-PF  0.5 mL Intramuscular During hospitalization    fluPHENAZine decanoate  50 mg Intramuscular Q30 Days    insulin NPH  7 Units Subcutaneous BID AC    insulin regular  0-20 Units Subcutaneous Q6H SCH    ipratropium  500 mcg Nebulization Q6H (RT)    pantoprazole (Protonix) intravenous solution  40 mg Intravenous Daily    polyethylene glycol  17 g Enteral tube: gastric Daily    sennosides  2.5 mL Enteral tube: gastric Nightly    sodium chloride  10 mL Intravenous Q8H    sodium chloride  10 mL Intravenous Q8H    sodium chloride  10 mL Intravenous Q8H    sodium chloride  4 mL Nebulization Q6H (RT)     Continuous Infusions:   Parenteral Nutrition (CENTRAL)      And    fat emulsion 20 % with fish oil      heparin 18 Units/kg/hr (03/18/24 0907)    Parenteral Nutrition (CENTRAL) 40 mL/hr at 03/18/24 0349     PRN Medications:  albuterol, dextrose in water, heparin (porcine), heparin (porcine), nicotine polacrilex **OR** nicotine polacrilex, ondansetron, phenol    Tammy Braun is critically ill due to acute kidney injury, altered mental status, atrial fibrillation, pneumonia, pulmonary embolism. This critical care time includes examining the patient, evaluating the hemodynamic, laboratory, and radiographic data, independently developing a comprehensive management plan, and serially assessing the patient's response to these critical care interventions. This critical care time excludes procedures.    Critical care time: 60 minutes     Audry Pili, PA   Surgical Intensive Care Unit  [facility]

## 2024-03-18 NOTE — Unmapped (Signed)
 Upper GI Surgery Consult Note      Requesting Attending Physician:  Joanie Coddington, MD  Service Requesting Consult:  [facility])    Assessment/Recommendations:    Tammy Braun is a 83 y.o. female with PMH bladder cancer with cystectomy and ileal conduit, COPD, recent dx of afib, moderate-severe TR, CAD (high calcification score), T2DM presented to [facility] with projectile vomiting and abdominal pain, found to have SBO with transition point at level of ileostomy on CT A/P taken to OR on 02/28/24 for ex-lap, reduciton of parastomal hernia, small bowel resection with primary anastomosis and lysis of adhesions, left open given increased abdominal compartment pressure taken back multiple times 03/01/24 and 03/03/24 for closure. Postoperative course has been complicated by pulmonary decompensation and reintubation with presumed aspiration among other complications.     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 This hernia has been present for many years dating back and largely unchanged from CT abdomen/pelvis in 2012 and markedly unchanged on CT from 3/14 from the CT obtained on initial presentation. Though the hernia does make her an increased aspiration risk, it is not the cause of her pulmonary decompensation. We agree with the [facility]'s management and recommendations of strict aspiration precautions, continuing NGT decompression to avoid further aspiration until clinically improving and if needed J tube access for feeding could be pursued. There is no indication for acute operative intervention at this time outside of the setting of acute gastric volvulus. From discussion with the patient's daughter, she additionally was not on oxygen prior to this admission and was not symptomatic from her paraesophageal hernia. She may be referred to UGI surgery after recovering from this acute insult should she wish to discuss elective repair however a minimally invasive abdominal approach may not be feasible given the size of the hernia.     History of Present Illness:     Reason for Consult:  paraesophageal hernia    Tammy Braun is a 83 y.o. female with PMH bladder cancer with cystectomy and ileal conduit, COPD, recent dx of afib, moderate-severe TR, CAD (high calcification score), T2DM presented to [facility] with projectile vomiting and abdominal pain, found to have SBO with transition point at level of ileostomy on CT A/P taken to OR on 02/28/24 for ex-lap, reduciton of parastomal hernia, small bowel resection with primary anastomosis and lysis of adhesions, left open given increased abdominal compartment pressure taken back multiple times 03/01/24 and 03/03/24 for closure. With post operative course complicated by pulmonary decompensation and reintubation with presumed aspiration among other complications. She has a chronic type IV paraesophageal hernia dating back to at least 2012. Per discussion with the daughter, the patient was not symptomatic, had minimal to no GERD, and had not had a history of aspiration. Further history was not obtained given surgical team at bedside obtaining goals of care discussion.      Allergies    Lisinopril, Losartan, and Hctz [hydrochlorothiazide]      Medications      Current Facility-Administered Medications   Medication Dose Route Frequency Provider Last Rate Last Admin    acetaminophen (OFIRMEV) 10 mg/mL injection 1,000 mg  1,000 mg Intravenous Q8H Lala Lund, ACNP   Stopped at 03/18/24 1807    albuterol 2.5 mg /3 mL (0.083 %) nebulizer solution 2.5 mg  2.5 mg Nebulization Q6H PRN Oliver Hum, MD   2.5 mg  at 02/28/24 1322    amiodarone (PACERONE) tablet 200 mg  200 mg Enteral tube: gastric Daily Lala Lund, ACNP   200 mg at 03/18/24 0856    arformoterol (BROVANA) nebulizer solution 15 mcg/2 mL  15 mcg Nebulization BID (RT) Antony Haste, MD   15 mcg at 03/18/24 0940    aspirin chewable tablet 81 mg  81 mg Enteral tube: gastric Daily Armandina Gemma, ACNP   81 mg at 03/18/24 0856    atorvastatin (LIPITOR) tablet 40 mg  40 mg Enteral tube: gastric Daily Bhatt, Priyanka, ACNP   40 mg at 03/18/24 0856    carboxymethylcellulose sodium (THERATEARS) 0.25 % ophthalmic solution 2 drop  2 drop Both Eyes TID Lala Lund, ACNP   2 drop at 03/18/24 1431    ceftolozane-tazobactam (ZERBAXA) 750 mg in sodium chloride (NS) 0.9 % 100 mL IVPB  750 mg Intravenous Q8H Raff, Abelino Derrick, MD   Stopped at 03/18/24 1643    dextrose (D10W) 10% bolus 125 mL  12.5 g Intravenous Q10 Min PRN Oliver Hum, MD   125 mL at 03/14/24 1815    Parenteral Nutrition (CENTRAL)   Intravenous Continuous Raff, Abelino Derrick, MD        And    fat emulsion 20 % with fish oil (SMOFlipid) infusion 250 mL  250 mL Intravenous Continuous Raff, Abelino Derrick, MD        flu vaccine [phone] UP)(PF)(FLUZONE HIGH DOSE)  0.5 mL Intramuscular During hospitalization Oliver Hum, MD        fluPHENAZine decanoate (PROLIXIN) injection 50 mg  50 mg Intramuscular Q30 Days Niehuus, Maceo Pro, MD   50 mg at 03/13/24 0235    heparin (porcine) 1000 unit/mL injection 1,200 Units  1,200 Units Intravenous Q1H PRN Lala Lund, ACNP   1,200 Units at 03/10/24 0120    heparin (porcine) 1000 unit/mL injection 2,000 Units  2,000 Units Intravenous Q6H PRN Armandina Gemma, ACNP   2,000 Units at 03/13/24 1226    heparin 25,000 Units/250 mL (100 units/mL) in 0.45% saline infusion (premade)  0-24 Units/kg/hr Intravenous Continuous Armandina Gemma, ACNP 12.24 mL/hr at 03/18/24 0907 18 Units/kg/hr at 03/18/24 0907    insulin NPH (HumuLIN,NovoLIN) injection 7 Units  7 Units Subcutaneous BID AC Raff, Abelino Derrick, MD        insulin regular (HumuLIN,NovoLIN) injection 0-20 Units  0-20 Units Subcutaneous Q6H [facility] Gracelyn Nurse L, NP   4 Units at 03/18/24 1159 ipratropium (ATROVENT) 0.02 % nebulizer solution 500 mcg  500 mcg Nebulization Q6H (RT) Tally Due, PA   500 mcg at 03/18/24 1519    nicotine polacrilex (NICORETTE) gum 4 mg  4 mg Buccal Q1H PRN Oliver Hum, MD        Or    nicotine polacrilex (NICORETTE) lozenge 4 mg  4 mg Buccal Q1H PRN Oliver Hum, MD        ondansetron [facility] [address]) injection 4 mg  4 mg Intravenous Q6H PRN Oliver Hum, MD   4 mg at 03/11/24 1154    pantoprazole (Protonix) injection 40 mg  40 mg Intravenous Daily Heber Mandan [place], [place]   40 mg at 03/18/24 0856    phenol (CHLORASEPTIC) 1.4 % spray 2 spray  2 spray Mucous Membrane Q2H PRN Lala Lund, ACNP   2 spray at 03/18/24 0150    polyethylene glycol (MIRALAX) packet 17 g  17 g Enteral tube: gastric Daily Lala Lund, ACNP  17 g at 03/18/24 0856    sennosides (SENOKOT) oral syrup  2.5 mL Enteral tube: gastric Nightly Lala Lund, ACNP   4.4 mg at 03/17/24 2013    sodium chloride (NS) 0.9 % flush 10 mL  10 mL Intravenous Q8H Ra, Vevelyn Francois, MD   10 mL at 03/18/24 1431    sodium chloride (NS) 0.9 % flush 10 mL  10 mL Intravenous Q8H Ra, Vevelyn Francois, MD   10 mL at 03/18/24 1431    sodium chloride (NS) 0.9 % flush 10 mL  10 mL Intravenous Q8H Ra, Vevelyn Francois, MD   10 mL at 03/18/24 1431    sodium chloride 3 % NEBULIZER solution 4 mL  4 mL Nebulization Q6H (RT) Maryann Alar, PA   4 mL at 03/18/24 1520     Facility-Administered Medications Ordered in Other Encounters   Medication Dose Route Frequency Provider Last Rate Last Admin    EPINEPHrine 100 mcg/10 mL (10 mcg/mL) injection   Intravenous PRN (once a day) Ichite, Precious C, MD   10 mcg at 02/28/24 1511    Propofol (DIPRIVAN) injection   Intravenous PRN (once a day) Ichite, Precious C, MD   60 mg at 02/28/24 1507    succinylcholine (ANECTINE) injection   Intravenous PRN (once a day) Ichite, Precious C, MD   60 mg at 02/28/24 1507         Past Medical History    Past Medical History: Diagnosis Date    Anxiety     Arthritis     At risk for falls     Breast cyst     Cancer (CMS-HCC)     bladder    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    Chronic kidney disease     Depression, psychotic (CMS-HCC)     Diabetes mellitus (CMS-HCC)     in past    Emphysema of lung (CMS-HCC)     Financial difficulties     Frail elderly     Hearing impairment     Hernia     History of transfusion     Hyperlipidemia     Hypertension     Impaired mobility     Osteoporosis     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/08/2015    Visual impairment          Past Surgical History    Past Surgical History:   Procedure Laterality Date    ABDOMINAL SURGERY      BLADDER SURGERY      BREAST CYST EXCISION      CHEMOTHERAPY  2012    bladder    GALLBLADDER SURGERY      stone removal    ILEOSTOMY  2012    PR COLONOSCOPY FLX DX W/COLLJ SPEC WHEN PFRMD N/A 02/09/2015    Procedure: COLONOSCOPY, FLEXIBLE, PROXIMAL TO SPLENIC FLEXURE; DIAGNOSTIC, W/WO COLLECTION SPECIMEN BY BRUSH OR WASH;  Surgeon: Dewaine Conger, MD;  Location: [facility] ;  Service: Gastroenterology    PR EXPLORATORY OF ABDOMEN N/A 02/28/2024    Procedure: EXPLORATORY LAPAROTOMY, EXPLORATORY CELIOTOMY WITH OR WITHOUT BIOPSY(S);  Surgeon: Renda Rolls, MD;  [facility];  Service: General Surgery    PR RECONSTR TOTAL SHOULDER IMPLANT Left 04/08/2018    Procedure: ARTHROPLASTY, GLENOHUMERAL JOINT; TOTAL SHOULDER(GLENOID & PROXIMAL HUMERAL REPLACEMENT(EG, TOTAL SHOULDER);  Surgeon: Tomasa Rand, MD;  [facility];  Service: Ortho Sports Medicine    PR REOPEN RECENT ABD  EXPLORATORY Midline 03/01/2024    Procedure: REOPENING OF RECENT LAPAROTOMY;  Surgeon: [name]., MD;  Location: OR [facility];  Service: Trauma    PR REOPEN RECENT ABD EXPLORATORY N/A 03/03/2024    Procedure: REOPENING OF RECENT LAPAROTOMY;  Surgeon: Joanie Coddington, MD;  Location: OR [facility];  Service: Trauma    PR SIGMOIDOSCOPY,BIOPSY N/A 03/11/2015    Procedure: SIGMOIDOSCOPY, FLEXIBLE; WITH BIOPSY, SINGLE OR MULTIPLE;  Surgeon: Wilburt Finlay, MD;  [facility];  Service: Gastroenterology    PR XCAPSL CTRC RMVL INSJ IO LENS PROSTH W/O ECP Left 06/05/2022    Procedure: EXTRACAPSULAR CATARACT REMOVAL W/INSERTION OF INTRAOCULAR LENS PROSTHESIS, MANUAL OR MECHANICAL TECHNIQUE WITHOUT ENDOSCOPIC CYCLOPHOTOCOAGULATION;  Surgeon: Garner Gavel, MD;  Location: [facility] OR [facility];  Service: Ophthalmology    PR XCAPSL CTRC RMVL INSJ IO LENS PROSTH W/O ECP Right 06/19/2022    Procedure: EXTRACAPSULAR CATARACT REMOVAL W/INSERTION OF INTRAOCULAR LENS PROSTHESIS, MANUAL OR MECHANICAL TECHNIQUE WITHOUT ENDOSCOPIC CYCLOPHOTOCOAGULATION;  Surgeon: Garner Gavel, MD;  Location: [facility] OR [facility];  Service: Ophthalmology         Family History    The patient's family history includes Breast cancer (age of onset: 65) in her daughter; Diabetes in her mother; Glaucoma in her father..      Social History:    Tobacco use: unobtainable due to patient factors  Alcohol use: unobtainable due to patient factors  Drug use: unobtainable due to patient factors    Objective:     Vital Signs    Patient Vitals for the past 8 hrs:   BP Temp Temp src Pulse SpO2 Pulse Resp SpO2   03/18/24 1800 108/73 -- -- 132 125 24 99 %   03/18/24 1700 114/71 -- -- 115 116 27 99 %   03/18/24 1600 112/70 -- -- 121 119 27 100 %   03/18/24 1520 -- -- -- 112 -- 24 100 %   03/18/24 1500 -- -- -- 109 114 21 99 %   03/18/24 1400 -- -- -- 105 105 17 94 %   03/18/24 1300 -- -- -- 117 114 20 100 %   03/18/24 1200 110/74 36.8 ??C (98.2 ??F) Oral 110 118 23 100 %     No intake/output data recorded.      Physical Exam    Constitutional: moderate respiratory distress  Neurologic: Opens eyes to voice. Nods yes/no appropriately.   Respiratory: Tachypnea, increased work of breathing, on Bipap   Cardiovascular:  Irregularly irregular rhythm, +2 pulses in all extremities   Gastrointestinal: Soft abdomen, midline incision staples in tact. Urostomy draining clear yellow urine. Surgical drainx1 with scant SS output  Musculoskeletal: Trace BLE/BUE edema.  Skin: Warm and dry       Test Results    All lab results last 24 hours:    Recent Results (from the past 24 hours)   POCT Glucose    Collection Time: 03/17/24 11:34 PM   Result Value Ref Range    Glucose, POC 223 (H) 70 - 179 mg/dL   Blood Gas Critical Care Panel, Arterial    Collection Time: 03/17/24 11:35 PM   Result Value Ref Range    Specimen Source Arterial     FIO2 Arterial Not Specified     pH, Arterial 7.33 (L) 7.35 - 7.45    pCO2, Arterial 35.0 35.0 - 45.0 mm Hg    pO2, Arterial 153.0 (H) 80.0 - 110.0 mm Hg    HCO3 (Bicarbonate),  Arterial 18 (L) 22 - 27 mmol/L    Base Excess, Arterial -7.1 (L) -2.0 - 2.0    O2 Sat, Arterial 99.0 94.0 - 100.0 %    Sodium Whole Blood 140 135 - 145 mmol/L    Potassium, Bld 3.8 3.4 - 4.6 mmol/L    Calcium, Ionized Arterial 5.22 4.40 - 5.40 mg/dL    Glucose Whole Blood 252 (H) 70 - 179 mg/dL    Lactate, Arterial 1.8 (H) <1.3 mmol/L    Hgb, blood gas 7.80 (L) 12.00 - 16.00 g/dL   APTT    Collection Time: 03/18/24  3:25 AM   Result Value Ref Range    APTT 87.2 (H) 24.8 - 38.4 sec    Heparin Correlation 0.5    CBC    Collection Time: 03/18/24  3:25 AM   Result Value Ref Range    WBC 14.0 (H) 3.6 - 11.2 10*9/L    RBC 2.90 (L) 3.95 - 5.13 10*12/L    HGB 7.8 (L) 11.3 - 14.9 g/dL    HCT 60.4 (L) 54.0 - 44.0 %    MCV 85.0 77.6 - 95.7 fL    MCH 26.9 25.9 - 32.4 pg    MCHC 31.7 (L) 32.0 - 36.0 g/dL    RDW 98.1 (H) 19.1 - 15.2 %    MPV 9.6 6.8 - 10.7 fL    Platelet 256 150 - 450 10*9/L   Basic Metabolic Panel    Collection Time: 03/18/24  3:25 AM   Result Value Ref Range    Sodium 142 135 - 145 mmol/L    Potassium 3.8 3.4 - 4.8 mmol/L    Chloride 109 (H) 98 - 107 mmol/L    CO2 26.0 20.0 - 31.0 mmol/L    Anion Gap 7 5 - 14 mmol/L    BUN 60 (H) 9 - 23 mg/dL    Creatinine 4.78 (H) 0.55 - 1.02 mg/dL    BUN/Creatinine Ratio 50     eGFR CKD-EPI (2021) Female 46 (L) >=60 mL/min/1.55m2    Glucose 207 (H) 70 - 179 mg/dL    Calcium 9.0 8.7 - 29.5 mg/dL   Magnesium Level    Collection Time: 03/18/24  3:25 AM   Result Value Ref Range    Magnesium 2.0 1.6 - 2.6 mg/dL   Phosphorus Level    Collection Time: 03/18/24  3:25 AM   Result Value Ref Range    Phosphorus 3.4 2.4 - 5.1 mg/dL   POCT Glucose    Collection Time: 03/18/24  5:20 AM   Result Value Ref Range    Glucose, POC 167 70 - 179 mg/dL   POCT Glucose    Collection Time: 03/18/24  9:53 AM   Result Value Ref Range    Glucose, POC 209 (H) 70 - 179 mg/dL   POCT Glucose    Collection Time: 03/18/24 11:54 AM   Result Value Ref Range    Glucose, POC 218 (H) 70 - 179 mg/dL   Basic Metabolic Panel    Collection Time: 03/18/24  4:01 PM   Result Value Ref Range    Sodium 144 135 - 145 mmol/L    Potassium 3.7 3.4 - 4.8 mmol/L    Chloride 109 (H) 98 - 107 mmol/L    CO2 25.0 20.0 - 31.0 mmol/L    Anion Gap 10 5 - 14 mmol/L    BUN 59 (H) 9 - 23 mg/dL    Creatinine 6.21 (H) 0.55 - 1.02 mg/dL  BUN/Creatinine Ratio 49     eGFR CKD-EPI (2021) Female 46 (L) >=60 mL/min/1.16m2    Glucose 112 70 - 179 mg/dL    Calcium 9.3 8.7 - 16.1 mg/dL   Magnesium Level    Collection Time: 03/18/24  4:01 PM   Result Value Ref Range    Magnesium 1.9 1.6 - 2.6 mg/dL   Phosphorus Level    Collection Time: 03/18/24  4:01 PM   Result Value Ref Range    Phosphorus 3.5 2.4 - 5.1 mg/dL   POCT Glucose    Collection Time: 03/18/24  4:56 PM   Result Value Ref Range    Glucose, POC 112 70 - 179 mg/dL       Imaging: Radiology studies were personally reviewed including all available cross sectional imaging dating back to 2012    Problem List    Principal Problem:    SBO (small bowel obstruction) (CMS-HCC)      Attestation    I reviewed the case.  I reviewed the fellow's note.  I agree with the fellow's findings and plan.     Kathi Simpers. Ferd Glassing, MD  03/19/2024  6:58 AM

## 2024-03-18 NOTE — Unmapped (Signed)
 Adult Nutrition Progress Note      Visit Type: Follow-Up  Reason for Visit: Enteral Nutrition, Parenteral Nutrition    PN start date: restart 3/15 via non-tunnelled CVC    Continue PN  Hold TFs in the setting of aspiration event and now on bipap.  May need j tube    Interval Events:  Remains NPO and on PN. Concern for ongoing aspiration risk given anatomy/hernia. Ongoing goals of care conversations-may need j tube    Daily Estimated Nutrient Needs:  Energy: 1525  1830 kcals 25-30 kcal/kg using estimated dry weight, 61 kg (03/02/24 1026)]  Protein: 90-120 gm [1.5-2.0 gm/kg using estimated dry weight, 61 kg (03/02/24 1026)]    Will continue to follow daily while on PN with full assessments on Mondays    Arcola Jansky MS, RD, LDN, CNSC

## 2024-03-18 NOTE — Unmapped (Signed)
 [facility]U PROGRESS NOTE     Date of Service: 03/18/2024    Hospital Day: LOS: 20 days        Surgery Date: TBD   Surgical Attending: Joanie Coddington, MD    Critical Care Attending: Lia Hopping, MD    Interval History:   Continues to have complete white out of left lung on bipap and HFNC overnight. ABG stable but WOB remains increased. Continued conversations on intubation/trach vs GOC.     History of Present Illness:   Tammy Braun is a 83 y.o. female with PMH bladder cancer with cystectomy and ileal conduit, COPD, recent dx of afib, moderate-severe TR, CAD (high calcification score), T2DM presented to [facility] with projectile vomiting and abdominal pain, found to have SBO with transition point at level of ileostomy on CT A/P. On initial presentation, labs remarkable for leukocytosis thought to be iso hemoconcentration, AKI on CKD, mildly elevated venous lactate improved with resuscitation. Admitted to [facility] for worsening hypoxia on HFNC requiring intubation in setting of hiatal hernia. OR on 02/28/24 for ex-lap, reduciton of parastomal hernia, small bowel resection with primary anastomosis and lysis of adhesions.      Hospital Course:  - 02/26/24: Admitted to [facility], floor status, consult to urology for possible ileal conduit revision.   - 02/27/24: Patient level of care escalated to step-down iso increased oxygen needs while in ED.  - 02/28/24: Upgraded to ICU. Intubated at bed-side. OR for ex-lap, reduction of parastomal hernia, small bowel resection with primary anastomosis, lysis of adhesions, abdomen left open with ABThera in place.   - 03/01/24: OR for attempt at abdominal closure. Left open due to desaturations during closure attempt. ABThera in place.  - 03/03/24: RTOR for abdominal closure  -03/06/24: Vas cath placed, CRRT started   -03/09/24: Extubated to HFNC  -03/10/24: CRRT discontinued   - 03/11/24: Re-intubated for hypoxic respiratory failure 2/2 mucus plug   - 03/14/24 Extubated to HFNC   -03/15/24: Vas cath removed   -03/16/24: Placed on BiPap for complete collapse of L lung 2/2 probable mucus plugging     ASSESSMENT & PLAN:     Neurologic:  - Pain: tylenol and robaxin SCH  - Discontinue PRN oxy and HM at family request    #schizoaffective disorder  - Monthly prolixin injection (given 3/14)    Cardiovascular:  - MAP goal > 65    #HFpEF  #Pulm HTN  - Off pressors  - ECHO 3/12- LVEF 70%, moderately dilated RV with moderate systolic dysfunction, severe pulmonary hypertension.    #Hx of HTN  -holding home amlodipine    #Hx of HLD  - atorvastatin    #Afib/flutter w RVR:   - Prior hx of paroxysmal afib. CHADSvasc = 6, no AC PTA per chart review.      > Holding home diltiazem, question efficacy given likely poor absorption  - Amio via NG tube 200mg  daily   - heparin gtt for [facility]     #Pulmonary embolism  - 3/5 CTA chest: Filling defect in L anterior segmental artery  - heparin gtt     #Acute thrombus right basilic   - Identified 3/3 on BUE duplex  - Supportive care, superficial, AC not required     Respiratory:  #Acute on chronic hypoxemia  #Hx of COPD  - Aggressive pulmonary toilet  - Start Brovana, continue atrovent nebs  - Continue hypertonic saline nebs for clearance     #Aspiration  - Continue Bipap, ok to break on  HFNC for short breaks, comfort, participation in conversions s/p aspiration event 3/17.   - ABG stable  - High risk for re-intubation and bronchoscopy     #Pseudomonas Pneumonia   #VAP  - see ID for abx plan    FEN/GI:  F: ML  E: Replete electrolytes PRN  N: NPO, TF held    - Renew TPN   - SLP consult on 3/17 - Ok for PO diet, continue strict NPO  - GI prophylaxis: pepcid  - Bowel regimen: cont miralax and senna   - Last BM: 3/17    #S/p ex-lap, reduction of parastomal hernia, small bowel resection with primary anastomosis, lysis of adhesions  -CT 3/13 demonstrating partial small bowel obstruction  -Blake drain#2 removed 3/16  - Blake drain #1 removed 3/17    #Large hiatal hernia, chronic  - re-eval on CT chest/abd/pelvis 3/13 stable    Renal/Genitourinary:  #AKI on CKD  -Nephrology signed off, HD cath removed  - Cr stable, cystatin c remains elevated (~1 higher than Scr)  - Diurese with IV lasix 80 mg q8h x2 doses, goal for net negative 1 L       #H/o bladder cancer s/p radical cystectomy with ileal conduit (2012):  #Parastomal hernia:  - Strict I&Os  - Ok for no foley in stoma at this time per urology    Endocrine:   #hyperglycemia   - Glycemic Control: SSI, NPH 5 unit BID    Hematologic:   - Hgb stable  - Monitor CBC daily, transfuse for hgb 7mg /dL  - DVT ppx: heparin gtt for PE    Immunologic/Infectious Disease:  - Afebrile with resolving leukocytosis     - Cultures:   - Blood cultures (3/4): negative, final   - BAL (3/4): Pseudomonas aeruginosa - susceptible to meropenum    - BAL (3/8): Pseudomonas aeruginosa and candida glabrata  -3/12: Susceptibilities show resistance to Meropenum, Cefepime, Ceftaz and Zosyn.   - Bcx (3/11): Negative, final  - BAL (3/12): 600,000+ psuedomonas (susceptibilities: resistant to meropenum)  - Ucx (3/13): no growth    - Antimicrobials:    - Ceftriaxone 2/27- 2/28 - colonized UTI   - Zosyn (3/1 - 3/5) for abdominal contamination  - Zosyn (3/6) for pseudomonas when sensitivities remained pending  - Zerbaxa (3/12-3/21 ) for 10 day course  - ID following, appreciate recs    Musculoskeletal:   #Deconditioned   - PT/OT consulted     Daily Care Checklist:   - Stress Ulcer Prevention: No  - DVT Prophylaxis: Chemical: Heparin drip  - Daily Awakening: Yes  - Spontaneous Breathing Trial: Yes  - Indication for Central/PICC Line: Yes  Infusions requiring central access, Hemodynamic monitoring, and Inadequate peripheral access, LIJ triple lumen  - Indication for Urinary Catheter: No   - Diagnostic images/reports of past 24hrs reviewed: Yes    Disposition:   - Continue ICU care  - PT/OT consulted:yes     SUBJECTIVE:    Alert and appropriate. Follows commands. Denies pain. States that she does not want tracheostomy      OBJECTIVE:     Physical Exam:  Constitutional: Sitting in bed, in moderate respiratory distress  Neurologic: Opens eyes to voice. Nods yes/no appropriately. Pupils equal and responsive to light.   Respiratory: Tachypnea ~40RR, coarse breath sounds L>R, on Bipap, desats to 70s w/ removal   Cardiovascular:  Irregularly irregular rhythm ~120bpm, +2 pulses in all extremities   Gastrointestinal: Soft abdomen, midline incision staples in tact. Urostomy draining  clear yellow urine. Surgical drainx1 with scant SS output  Musculoskeletal: Trace BLE/BUE edema.  Skin: Warm and dry     Temp:  [36.7 ??C (98.1 ??F)-36.9 ??C (98.4 ??F)] 36.8 ??C (98.2 ??F)  Heart Rate:  [96-121] 110  SpO2 Pulse:  [101-127] 118  Resp:  [16-32] 23  BP: (91-149)/(57-81) 115/76  MAP (mmHg):  [64-92] 88  FiO2 (%):  [40 %-80 %] 65 %  SpO2:  [96 %-100 %] 100 %     Recent Laboratory Results:  Recent Labs     Units 03/17/24  2335   PHART  7.33*   PCO2ART mm Hg 35.0   PO2ART mm Hg 153.0*   HCO3ART mmol/L 18*   BEART  -7.1*   O2SATART % 99.0         Recent Labs     Units 03/17/24  1559 03/17/24  2026 03/17/24  2335 03/18/24  0325   NA mmol/L 144 142 140 142   K mmol/L 3.8 4.1 3.8 3.8   CL mmol/L 109* 105  --  109*   CO2 mmol/L 25.0 25.0  --  26.0   BUN mg/dL 57* 55*  --  60*   CREATININE mg/dL 2.53* 6.64*  --  4.03*   GLU mg/dL 474* 259*  --  563*     Lab Results   Component Value Date    BILITOT 0.3 03/15/2024    BILITOT 0.4 03/14/2024    BILIDIR 0.20 03/15/2024    BILIDIR 0.20 03/14/2024    ALT 10 03/15/2024    ALT 11 03/14/2024    AST 13 03/15/2024    AST 13 03/14/2024    GGT 16 09/17/2011    GGT 354 (H) 05/03/2011    ALKPHOS 60 03/15/2024    ALKPHOS 58 03/14/2024    PROT 5.7 03/15/2024    PROT 5.4 (L) 03/14/2024    ALBUMIN 1.9 (L) 03/15/2024    ALBUMIN 1.8 (L) 03/14/2024     Recent Labs     Units 03/17/24  2334 03/18/24  0520 03/18/24  0953 03/18/24  1154   POCGLU mg/dL 875* 643 329* 518*     Recent Labs     Units 03/16/24  0424 03/17/24  0424 03/18/24  0325   WBC 10*9/L 8.9 11.9* 14.0*   RBC 10*12/L 2.73* 3.13* 2.90*   HGB g/dL 7.6* 8.4* 7.8*   HCT % 23.2* 26.8* 24.7*   MCV fL 85.0 85.7 85.0   MCH pg 27.6 26.7 26.9   MCHC g/dL 84.1 66.0* 63.0*   RDW % 17.8* 18.1* 17.4*   PLT 10*9/L 240 290 256   MPV fL 9.9 9.4 9.6     Recent Labs     Units 03/18/24  0325   APTT sec 87.2*      Lines & Tubes:   Patient Lines/Drains/Airways Status       Active Peripheral & Central Intravenous Access       Name Placement date Placement time Site Days    Peripheral IV 03/04/24 Anterior;Proximal;Right Forearm 03/04/24  0021  Forearm  14    CVC Triple Lumen 03/07/24 Non-tunneled Left Internal jugular 03/07/24  0849  Internal jugular  11                     Patient Lines/Drains/Airways Status       Active Wounds       Name Placement date Placement time Site Days    Surgical Site 04/08/18 Shoulder Left 04/08/18  1338  -- 2171    Surgical Site 06/05/22 Eye Left 06/05/22  0928  -- 652    Surgical Site 06/19/22 Eye Right 06/19/22  1024  -- 638    Surgical Site 03/03/24 Abdomen 03/03/24  1013  -- 15    Wound 06/24/20 Other (comment) Buttocks inner right buttock 06/24/20  1010  Buttocks  1363    Wound 03/11/24 Other (comment) Sacrum Mid moisture/friction injury 03/11/24  1200  Sacrum  7    Wound 03/12/24 Other (comment) Groin Right 03/12/24  1500  Groin  5    Wound 03/13/24 Neck Right open area note arount the HD catheter durng the dressing change 03/13/24  0930  Neck  5                     Respiratory/ventilator settings for last 24 hours:   FiO2 (%): 65 %  S RR: 12    Intake/Output last 3 shifts:  I/O last 3 completed shifts:  In: 2699 [I.V.:413.9; NG/GT:170; IV Piggyback:404.7]  Out: 4425 [Urine:4225; Emesis/NG output:200]    Daily/Recent Weight:  73.9 kg (162 lb 14.7 oz)    BMI:  Body mass index is 30.8 kg/m??.    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    Chronic kidney disease     Depression, psychotic (CMS-HCC)     Diabetes mellitus (CMS-HCC)     in past    Emphysema of lung (CMS-HCC)     Financial difficulties     Frail elderly     Hearing impairment     Hernia     History of transfusion     Hyperlipidemia     Hypertension     Impaired mobility     Osteoporosis     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/08/2015    Visual impairment      Past Surgical History:   Procedure Laterality Date    ABDOMINAL SURGERY      BLADDER SURGERY      BREAST CYST EXCISION      CHEMOTHERAPY  2012    bladder    GALLBLADDER SURGERY      stone removal    ILEOSTOMY  2012    PR COLONOSCOPY FLX DX W/COLLJ SPEC WHEN PFRMD N/A 02/09/2015    Procedure: COLONOSCOPY, FLEXIBLE, PROXIMAL TO SPLENIC FLEXURE; DIAGNOSTIC, W/WO COLLECTION SPECIMEN BY BRUSH OR WASH;  Surgeon: Dewaine Conger, MD;  Location: [facility] [place];  Service: Gastroenterology    PR EXPLORATORY OF ABDOMEN N/A 02/28/2024    Procedure: EXPLORATORY LAPAROTOMY, EXPLORATORY CELIOTOMY WITH OR WITHOUT BIOPSY(S);  Surgeon: Renda Rolls, MD;  Location: [facility];  Service: General Surgery    PR RECONSTR TOTAL SHOULDER IMPLANT Left 04/08/2018    Procedure: ARTHROPLASTY, GLENOHUMERAL JOINT; TOTAL SHOULDER(GLENOID & PROXIMAL HUMERAL REPLACEMENT(EG, TOTAL SHOULDER);  Surgeon: Tomasa Rand, MD;  Location: [facility];  Service: Ortho Sports Medicine    PR REOPEN RECENT ABD EXPLORATORY Midline 03/01/2024    Procedure: REOPENING OF RECENT LAPAROTOMY;  Surgeon: [name]., MD;  Location: OR [facility];  Service: Trauma    PR REOPEN RECENT ABD EXPLORATORY N/A 03/03/2024    Procedure: REOPENING OF RECENT LAPAROTOMY;  Surgeon: Joanie Coddington,  MD;  Location: OR [facility];  Service: Trauma    PR SIGMOIDOSCOPY,BIOPSY N/A 03/11/2015    Procedure: SIGMOIDOSCOPY, FLEXIBLE; WITH BIOPSY, SINGLE OR MULTIPLE;  Surgeon: Wilburt Finlay, MD;  [facility];  Service: Gastroenterology    PR XCAPSL CTRC RMVL INSJ IO LENS PROSTH W/O ECP Left 06/05/2022    Procedure: EXTRACAPSULAR CATARACT REMOVAL W/INSERTION OF INTRAOCULAR LENS PROSTHESIS, MANUAL OR MECHANICAL TECHNIQUE WITHOUT ENDOSCOPIC CYCLOPHOTOCOAGULATION;  Surgeon: Garner Gavel, MD;  Location: [facility] OR [facility];  Service: Ophthalmology    PR XCAPSL CTRC RMVL INSJ IO LENS PROSTH W/O ECP Right 06/19/2022    Procedure: EXTRACAPSULAR CATARACT REMOVAL W/INSERTION OF INTRAOCULAR LENS PROSTHESIS, MANUAL OR MECHANICAL TECHNIQUE WITHOUT ENDOSCOPIC CYCLOPHOTOCOAGULATION;  Surgeon: Garner Gavel, MD;  Location: [facility] OR [facility];  Service: Ophthalmology     Scheduled Medications:   acetaminophen  1,000 mg Intravenous Q8H    amiodarone  200 mg Enteral tube: gastric Daily    arformoterol  15 mcg Nebulization BID (RT)    aspirin  81 mg Enteral tube: gastric Daily    atorvastatin  40 mg Enteral tube: gastric Daily    carboxymethylcellulose sodium  2 drop Both Eyes TID    ceftolozane-tazobactam  750 mg Intravenous Q8H    flu vacc [id number](62yr up)-PF  0.5 mL Intramuscular During hospitalization    fluPHENAZine decanoate  50 mg Intramuscular Q30 Days    furosemide  80 mg Intravenous Q6H    insulin NPH  7 Units Subcutaneous BID AC    insulin regular  0-20 Units Subcutaneous Q6H SCH    ipratropium  500 mcg Nebulization Q6H (RT)    pantoprazole (Protonix) intravenous solution  40 mg Intravenous Daily    polyethylene glycol  17 g Enteral tube: gastric Daily    potassium chloride in water  20 mEq Intravenous Once    sennosides  2.5 mL Enteral tube: gastric Nightly    sodium chloride  10 mL Intravenous Q8H    sodium chloride  10 mL Intravenous Q8H    sodium chloride  10 mL Intravenous Q8H    sodium chloride  4 mL Nebulization Q6H (RT)     Continuous Infusions:   Parenteral Nutrition (CENTRAL)      And    fat emulsion 20 % with fish oil      heparin 18 Units/kg/hr (03/18/24 0907) NxStage RFP 400 (+/- BB) 5000 mL - contains 2 mEq/L of potassium      NxStage/multiBic RFP 401 (+/- BB) 5000 mL - contains 4 mEq/L of potassium      Parenteral Nutrition (CENTRAL) 40 mL/hr at 03/18/24 0349     PRN Medications:  albuterol, dextrose in water, heparin (porcine), heparin (porcine), nicotine polacrilex **OR** nicotine polacrilex, ondansetron, phenol    Tammy Braun is critically ill due to acute kidney injury, altered mental status, atrial fibrillation, pneumonia, pulmonary embolism. This critical care time includes examining the patient, evaluating the hemodynamic, laboratory, and radiographic data, independently developing a comprehensive management plan, and serially assessing the patient's response to these critical care interventions. This critical care time excludes procedures.    Critical care time: 90 minutes     Katina Degree, ACNP   Surgical Intensive Care Unit  [facility]

## 2024-03-18 NOTE — Unmapped (Signed)
 Pt A&O x4. Forgetful. Vts are stable. Moment of anxiety and stating she had difficulty breathing. SAT remain above 92%. On and off biPAP per pt request. Pt family extremely anxious, reassured by staff. BGT to LIS.

## 2024-03-18 NOTE — Unmapped (Cosign Needed)
 ADVANCE CARE PLANNING NOTE    Discussion Date:  March 18, 2024    Patient has decisional capacity:  No, but does have ability to make global statements about her desires for her future    Patient has selected a Health Care Decision-Maker if loses capacity: Yes    Health Care Decision Maker as of 03/18/2024    HCDM (patient stated preference) (Active): [name] - Daughter - [phone]    Discussion Participants:  Pascal Lux, Rolanda Lundborg, NP, [name], Haskell Riling, [place], Oliver Hum, MD    Communication of Medical Status/Prognosis:   The medical team is very concerned that Tammy Braun will require reintubation as her chest x-ray demonstrates complete whiteout of her left lung indicating probable mucous plugging.  Tammy Braun is extremely deconditioned given her nearly 3-week ICU stay.     Communication of Treatment Goals/Options:   Tammy Braun required reintubation during this hospitalization already due to aspiration, pneumonia, and mucous plugging.  She is even more deconditioned than before given that she remains in the ICU, so if she is reintubated, she will likely require tracheostomy for ventilator weaning and airway protection.  If she were to proceed to the OR for tracheostomy, we would recommend J-tube placement at this time given multiple aspiration events during this hospitalization.     The primary team was trying by the palliative care team to discuss the likely clinical courses for proceeding with intubation and probable tracheostomy versus declining intubation.  If patient were to proceed to intubation and tracheostomy, she will likely transfer to an LTAC or similar facility to assist with later needs.  If the patient were to decline intubation and instead opt for comfort measures, she could discharge home from the hospital.  If she undergoes tracheostomy, she will likely never be discharged to home.    Treatment Decisions:   Multiple discussions were conducted with Tammy Braun, the patient, [facility]U providers and the palliative care team.  Tammy Braun is concerned that the medical team has been providing updates to her mother and believes this communication is causing her to lose hope and feel stressed.  She has requested that no further medical updates be given to her mother.  However, when the patient was asked directly if she would like to receive medical updates, she confirmed that she would in fact like to continue to be updated.    The patient was asked multiple times throughout the day if she would be okay with a tracheostomy.  Multiple times she adamantly shook her head and said no, pointing to her neck.  However, later in the day she said yes to the question of tracheostomy stating she would be ok with the procedure if it would help her breathing.  Given this inconsistency and her lack of ability to state the risks and benefits of pursuing or not pursuing tracheostomy, Ms. Wingler does not demonstrate decision-making capacity.    Ultimately, [name] expressed that she would like to have a conversation regarding tracheostomy with other family members and her mother before the patient is intubated given that once her mother is intubated, she will not be able to converse easily with her about her wishes.  Multiple family members will be coming to the hospital tonight for this discussion.  Tammy Braun will let the [facility] team know when they have made a decision as a family such that the team can proceed with intubation and bronchoscopy.    Oliver Hum, MD  [facility] General Surgery Program - PGY1

## 2024-03-18 NOTE — Unmapped (Addendum)
 Clinical Ethics Service Consult Note    The Clinical Ethics Service was asked by Tammy Braun [facility]) to offer a consultation regarding Tammy Braun.  The stated reason for the consultation is medical decision-making concerns.      Consultants: Florian Buff, Anibal Henderson    Description of Consult Activity: Reviewed patient chart, Spoke with the medical team, and Conducted a meeting with the following participants Ethics Consult Member(s) , Physician(s) , Nurse(s) , Case Manager(s) , and APPs.    Case Summary: Tammy Braun is an 83 y.o. female with PMH of bladder cancer with ileal conduit, COPD, DM, CKD, and prior CVA. She presented to [facility] 02/26/24 with small bowel obstruction and acute kidney injury. She was transferred to [facility] 2/28 for hypoxic respiratory failure requiring intubation. She underwent open surgery for bowel obstruction with lysis of adhesions on 2/28 followed by abdominal closure on 03/03/24. She has had a prolonged stay in the [facility], with multiple infections, renal failure requiring a period of continuous dialysis, and persistent respiratory failure. Before this hospitalization the patient has lived, for 16 years, with her daughter, Tammy Braun, who, among the patient's children, has been identified by the patient as her HCDM. The patient's daughter is frequently at the bedside and involved in discussions with care team members.    Attempts at extubation have failed multiple times, her respiratory status is complicated by a large hiatal hernia, recurrent aspiration and mucus plugging. She was last extubated on 3/15 and subsequently has experienced recurrent aspiration, mucus plugging and collapse of the left lung. Because of her worsening respiratory status, the team is discussing with the patient and her daughter Tammy Braun that there is a high likelihood of re-intubation. They discussed that if Tammy Braun is re-intubated, at this point tracheostomy would be recommended if the patient's goal was to extend life. Given her recurrent aspiration, the team would also recommend feeding tube concurrently to support this goal.    Tammy Braun has repeatedly stated she does not want tracheostomy, but her reasoning is unclear. While this is her abiding stated preference in this hospitalization, there is uncertainty among team members as to the patient's capacity to make a decision to decline the procedure.     The team is concerned because her daughter Tammy Braun has stated that she knows her mother would not want a tracheostomy but insists that her mother will have to get one if it keeps her alive. Tammy Braun has told Tammy Braun in front of the staff that she needs to hand over the baton of making these decisions to her. Even more concerning to the team, Tammy Braun has asked them to no longer discuss this treatment information with her mother, stating that the information is causing distress and leading to her mother losing hope.  She prefers that her mother no longer hear this medical information or be involved in making these decisions. Tammy Braun's requests cause concern because the patient has otherwise to this point been involved in her medical decisions. While she indicates her daughter is her chosen HCDM, she has not overtly indicated a desire to no longer receive information or to not participate in her decision making.     Ethical Analysis: Respect for persons requires that we allow individuals to have autonomy whenever possible, and that we also protect those with diminished ability to exercise their autonomy. Patients have the right to make informed choices about their own healthcare. For our patients with diminished capacity, we must find ways to allow them to exercise as  much autonomy as possible.     Informed consent demands that the decision-maker must receive adequate information in order to make a fully informed autonomous decision. Even if a person lacks capacity for some higher-level decisions, they may retain capacity for other decisions and should be involved in decision-making to the extent possible if they desire.     Individual autonomy traditionally holds the highest value in [place] medicine. While it may seem that a person is being subjected to undue influence by family members, it is important to remember that people often wish to take their family members' opinions into account when making medical decisions. Respecting patient autonomy thus means also respecting how patients wish to make a decision in the context of their support system.    Recommendations:   If possible, while Tammy Braun is able, engage her in a conversation to determine how she prefers to receive information and make decisions. It could also be helpful at this time to gauge her understanding of prior conversations regarding the recommended care to determine to what extent she is capable of engaging in decision-making. If it remains unclear, consultation with Psychiatry may helpful to determine capacity. An updated ACP note documenting the patient's preferences could be helpful should she no longer be able to participate in related decision-making. This is especially important if the HCDM or others would prefer to act in ways that do not align with her preferences as we understand them.   Tammy Braun should continue to be informed and engaged in decision making to the extent that she desires and is capable. It is also permissible for her to defer decision-making to her daughter if she so wishes. And even if daughter Tammy Braun is her preferred decision-maker, support from other family members may be useful in GOC conversations.   Continue ongoing discussions to determine Tammy Braun's and her family's goals. It may be helpful to explore what each treatment option might entail for her disposition given their desire to avoid long-term placement or nursing home stay.  Prior to changes in the treatment plan, it could be helpful to clarify for the patient and her surrogate the milestones the team will employ in their medical assessment of the ongoing benefit of that treatment. That is, a trial of any therapy aims for a benefit that the team will be able to assess at a certain time. These metrics/milestones should be clear such that if risks of harm begin to outweigh any prospect of benefit, the patient and family are prepared for both a successful and an unsuccessful trial and next steps.    This consult is still open.     Note: These recommendations are advisory. They follow from the patient's status and available information at the time of the consult. Recommendations are offered on behalf of the Clinical Ethics Service supported by the [facility] Ethics Committee.  They are not medical judgments or legal advice. Ethics consultants are not acting in a clinical role when conducting ethics consultations. Involved parties must decide whether and how any advice offered might assist them in relevant decision-making. Our consult service is available without charge to any member of the care team, and to patients or their loved ones, for help in addressing ethical issues that arise in the course of patient care. Please call [phone] to request further assistance. Thank you for this consultation.    Beatris Si, MD   Lead Consultant, Clinical Ethics Service  03/18/2024 1:33 PM

## 2024-03-18 NOTE — Unmapped (Incomplete)
 Palliative Care Progress Note      Consultation from Requesting Attending Physician:  Joanie Coddington, MD  Primary Care Provider:  Vanetta Shawl, MD    Assessment/Plan:      SUMMARY:  This 83 y.o. patient is seriously and acutely ill due to small bowel obstruction, complicated by co-morbid acute and chronic conditions including acute hypoxic respiratory failure requiring intubation x2, aspiration, hiatal hernia, s/p OR on 02/28/24 for ex-lap, reduciton of parastomal hernia, small bowel resection with primary anastomosis and lysis of adhesions, AKI on CKD, atrial fibrillation, pulmonary embolism, schizoaffective disorder, HFpEF, pulmonary hypertension, HTN, COPD, bladder cancer with cystectomy and ileal conduit, moderate-severe TR, CAD (high calcification score), T2DM.     Today's visit addressed goals of care    Symptom Assessment and Recommendations:    None needed at this time    Goals of care / ACP:  Code Status:   Code Status: Full Code   Healthcare decision-maker if lacks capacity:   HCDM (patient stated preference) (Active): Tammy Braun - Daughter - [phone]      Current goals of care as discussed on 3/19:  ***          Practical, Emotional, Spiritual Support Recommendations:  Palliative care visit today included focused interview, active listening, therapeutic use of silence, offering support, sharing empathy and summarization of today's discussion.      Recommendations shared with primary team {Consult communication:94514::by phone}        Thank you for this consult. Please contact  ***  or page Palliative Care if there are any questions.     Subjective:     Recent Events:  ***      Objective:     Function:  {Palliative Performance Scale:30424297}    Temp:  [36.8 ??C (98.2 ??F)-36.9 ??C (98.4 ??F)] 36.8 ??C (98.2 ??F)  Heart Rate:  [96-121] 121  SpO2 Pulse:  [101-127] 119  Resp:  [16-30] 27  BP: (91-149)/(57-77) 115/76  FiO2 (%):  [40 %-80 %] 65 %  SpO2:  [94 %-100 %] 100 %    Physical Exam:  Constitutional: ***   Eyes: anicteric sclera, no discharge  ENMT: *** oral mucosa  Pulm: ***   CV: ***  Abd: ***  MSK: *** sarcopenia  Skin: ***  Neuro: mental status ***  muscle strength ***  Psych: mood and affect euthymic    Testing reviewed and interpreted:   {Test interpret:94513::None today}    I personally spent *** minutes face-to-face and non-face-to-face in the care of this patient, which includes all pre, intra, and post visit time on the date of service.  All documented time was specific to the E/M visit and does not include any procedures that may have been performed.     See ACP Note from today for additional billable service:  No.     Beryle Beams, PA-C  Physician Assistant  Palliative Care

## 2024-03-19 LAB — BASIC METABOLIC PANEL
ANION GAP: 12 mmol/L (ref 5–14)
ANION GAP: 10 mmol/L (ref 5–14)
BLOOD UREA NITROGEN: 65 mg/dL — ABNORMAL HIGH (ref 9–23)
BLOOD UREA NITROGEN: 66 mg/dL — ABNORMAL HIGH (ref 9–23)
BUN / CREAT RATIO: 56
BUN / CREAT RATIO: 57
CALCIUM: 9.2 mg/dL (ref 8.7–10.4)
CALCIUM: 8.9 mg/dL (ref 8.7–10.4)
CHLORIDE: 109 mmol/L — ABNORMAL HIGH (ref 98–107)
CHLORIDE: 110 mmol/L — ABNORMAL HIGH (ref 98–107)
CO2: 26 mmol/L (ref 20.0–31.0)
CO2: 26 mmol/L (ref 20.0–31.0)
CREATININE: 1.16 mg/dL — ABNORMAL HIGH (ref 0.55–1.02)
CREATININE: 1.16 mg/dL — ABNORMAL HIGH (ref 0.55–1.02)
EGFR CKD-EPI (2021) FEMALE: 47 mL/min/{1.73_m2} — ABNORMAL LOW (ref >=60)
EGFR CKD-EPI (2021) FEMALE: 47 mL/min/{1.73_m2} — ABNORMAL LOW (ref >=60)
GLUCOSE RANDOM: 219 mg/dL — ABNORMAL HIGH (ref 70–179)
GLUCOSE RANDOM: 193 mg/dL — ABNORMAL HIGH (ref 70–99)
POTASSIUM: 3.7 mmol/L (ref 3.4–4.8)
POTASSIUM: 3.8 mmol/L (ref 3.4–4.8)
SODIUM: 147 mmol/L — ABNORMAL HIGH (ref 135–145)
SODIUM: 146 mmol/L — ABNORMAL HIGH (ref 135–145)

## 2024-03-19 LAB — CBC
HEMATOCRIT: 24.5 % — ABNORMAL LOW (ref 34.0–44.0)
HEMOGLOBIN: 7.6 g/dL — ABNORMAL LOW (ref 11.3–14.9)
MEAN CORPUSCULAR HEMOGLOBIN CONC: 31.1 g/dL — ABNORMAL LOW (ref 32.0–36.0)
MEAN CORPUSCULAR HEMOGLOBIN: 26.7 pg (ref 25.9–32.4)
MEAN CORPUSCULAR VOLUME: 85.7 fL (ref 77.6–95.7)
MEAN PLATELET VOLUME: 9.4 fL (ref 6.8–10.7)
PLATELET COUNT: 268 10*9/L (ref 150–450)
RED BLOOD CELL COUNT: 2.86 10*12/L — ABNORMAL LOW (ref 3.95–5.13)
RED CELL DISTRIBUTION WIDTH: 18.4 % — ABNORMAL HIGH (ref 12.2–15.2)
WBC ADJUSTED: 11.6 10*9/L — ABNORMAL HIGH (ref 3.6–11.2)

## 2024-03-19 LAB — BLOOD GAS, ARTERIAL
BASE EXCESS ARTERIAL: -1.7 (ref -2.0–2.0)
HCO3 ARTERIAL: 23 mmol/L (ref 22–27)
O2 SATURATION ARTERIAL: 99.8 % (ref 94.0–100.0)
PCO2 ARTERIAL: 43.5 mmHg (ref 35.0–45.0)
PH ARTERIAL: 7.35 (ref 7.35–7.45)
PO2 ARTERIAL: 179 mmHg — ABNORMAL HIGH (ref 80.0–110.0)

## 2024-03-19 LAB — PHOSPHORUS
PHOSPHORUS: 3.9 mg/dL (ref 2.4–5.1)
PHOSPHORUS: 4.4 mg/dL (ref 2.4–5.1)

## 2024-03-19 LAB — APTT
APTT: 95.2 s — ABNORMAL HIGH (ref 24.8–38.4)
HEPARIN CORRELATION: 0.5

## 2024-03-19 LAB — TRIGLYCERIDES: TRIGLYCERIDES: 107 mg/dL (ref 0–150)

## 2024-03-19 LAB — MAGNESIUM
MAGNESIUM: 1.9 mg/dL (ref 1.6–2.6)
MAGNESIUM: 1.9 mg/dL (ref 1.6–2.6)

## 2024-03-19 MED ADMIN — NORepinephrine bitartrate-D5W 8 mg/250 mL (32 mcg/mL) infusion: INTRAVENOUS | @ 14:00:00 | Stop: 2024-03-19

## 2024-03-19 MED ADMIN — insulin NPH (HumuLIN,NovoLIN) injection 7 Units: 7 [IU] | SUBCUTANEOUS | @ 13:00:00 | Stop: 2024-03-19

## 2024-03-19 MED ADMIN — fentaNYL PF (SUBLIMAZE) (50 mcg/mL) infusion (bag): 0-200 ug/h | INTRAVENOUS | @ 15:00:00 | Stop: 2024-04-02

## 2024-03-19 MED ADMIN — arformoterol (BROVANA) nebulizer solution 15 mcg/2 mL: 15 ug | RESPIRATORY_TRACT | @ 03:00:00

## 2024-03-19 MED ADMIN — Parenteral Nutrition (CENTRAL): INTRAVENOUS | @ 03:00:00 | Stop: 2024-03-19

## 2024-03-19 MED ADMIN — sodium chloride 3 % NEBULIZER solution 4 mL: 4 mL | RESPIRATORY_TRACT | @ 15:00:00

## 2024-03-19 MED ADMIN — insulin regular (HumuLIN,NovoLIN) injection 0-20 Units: 0-20 [IU] | SUBCUTANEOUS | @ 17:00:00

## 2024-03-19 MED ADMIN — sodium chloride (NS) 0.9 % flush 10 mL: 10 mL | INTRAVENOUS | @ 10:00:00

## 2024-03-19 MED ADMIN — etomidate (AMIDATE) injection: INTRAVENOUS | @ 14:00:00 | Stop: 2024-03-19

## 2024-03-19 MED ADMIN — sodium chloride (NS) 0.9 % flush 10 mL: 10 mL | INTRAVENOUS | @ 03:00:00

## 2024-03-19 MED ADMIN — insulin regular (HumuLIN,NovoLIN) injection 0-20 Units: 0-20 [IU] | SUBCUTANEOUS | @ 05:00:00

## 2024-03-19 MED ADMIN — acetaminophen (TYLENOL) tablet 1,000 mg: 1000 mg | GASTROENTERAL | @ 21:00:00

## 2024-03-19 MED ADMIN — ipratropium (ATROVENT) 0.02 % nebulizer solution 500 mcg: 500 ug | RESPIRATORY_TRACT | @ 03:00:00

## 2024-03-19 MED ADMIN — propofol (DIPRIVAN) infusion 10 mg/mL: 0-40 ug/kg/min | INTRAVENOUS | @ 14:00:00

## 2024-03-19 MED ADMIN — heparin 25,000 Units/250 mL (100 units/mL) in 0.45% saline infusion (premade): 0-24 [IU]/kg/h | INTRAVENOUS | @ 10:00:00

## 2024-03-19 MED ADMIN — insulin regular (HumuLIN,NovoLIN) injection 0-20 Units: 0-20 [IU] | SUBCUTANEOUS | @ 22:00:00

## 2024-03-19 MED ADMIN — ipratropium (ATROVENT) 0.02 % nebulizer solution 500 mcg: 500 ug | RESPIRATORY_TRACT | @ 15:00:00

## 2024-03-19 MED ADMIN — sodium chloride 3 % NEBULIZER solution 4 mL: 4 mL | RESPIRATORY_TRACT | @ 09:00:00

## 2024-03-19 MED ADMIN — carboxymethylcellulose sodium (THERATEARS) 0.25 % ophthalmic solution 2 drop: 2 [drp] | OPHTHALMIC | @ 13:00:00

## 2024-03-19 MED ADMIN — insulin regular (HumuLIN,NovoLIN) injection 0-20 Units: 0-20 [IU] | SUBCUTANEOUS | @ 10:00:00

## 2024-03-19 MED ADMIN — polyethylene glycol (MIRALAX) packet 17 g: 17 g | GASTROENTERAL | @ 13:00:00

## 2024-03-19 MED ADMIN — NORepinephrine 8 mg in dextrose 5 % 250 mL (32 mcg/mL) infusion PMB: 0-10 ug/min | INTRAVENOUS | @ 14:00:00

## 2024-03-19 MED ADMIN — carboxymethylcellulose sodium (THERATEARS) 0.25 % ophthalmic solution 2 drop: 2 [drp] | OPHTHALMIC | @ 02:00:00

## 2024-03-19 MED ADMIN — acetaminophen (OFIRMEV) 10 mg/mL injection 1,000 mg: 1000 mg | INTRAVENOUS | @ 05:00:00 | Stop: 2024-03-19

## 2024-03-19 MED ADMIN — ipratropium (ATROVENT) 0.02 % nebulizer solution 500 mcg: 500 ug | RESPIRATORY_TRACT | @ 09:00:00

## 2024-03-19 MED ADMIN — sodium chloride (NS) 0.9 % flush 10 mL: 10 mL | INTRAVENOUS | @ 17:00:00

## 2024-03-19 MED ADMIN — acetaminophen (TYLENOL) tablet 1,000 mg: 1000 mg | GASTROENTERAL | @ 14:00:00

## 2024-03-19 MED ADMIN — sodium chloride 3 % NEBULIZER solution 4 mL: 4 mL | RESPIRATORY_TRACT | @ 03:00:00

## 2024-03-19 MED ADMIN — ROCuronium (ZEMURON) injection: INTRAVENOUS | @ 14:00:00 | Stop: 2024-03-19

## 2024-03-19 MED ADMIN — sodium chloride 3 % NEBULIZER solution 4 mL: 4 mL | RESPIRATORY_TRACT | @ 20:00:00

## 2024-03-19 MED ADMIN — Propofol (DIPRIVAN) 10 mg/mL injection: INTRAVENOUS | @ 14:00:00 | Stop: 2024-03-19

## 2024-03-19 MED ADMIN — ipratropium (ATROVENT) 0.02 % nebulizer solution 500 mcg: 500 ug | RESPIRATORY_TRACT | @ 20:00:00

## 2024-03-19 MED ADMIN — fentaNYL (PF) (SUBLIMAZE) injection 100 mcg: 100 ug | INTRAVENOUS | @ 16:00:00 | Stop: 2024-03-19

## 2024-03-19 MED ADMIN — ceftolozane-tazobactam (ZERBAXA) 750 mg in sodium chloride (NS) 0.9 % 100 mL IVPB: 750 mg | INTRAVENOUS | @ 05:00:00 | Stop: 2024-03-20

## 2024-03-19 MED ADMIN — fat emulsion 20 % with fish oil (SMOFlipid) infusion 250 mL: 250 mL | INTRAVENOUS | @ 03:00:00 | Stop: 2024-03-19

## 2024-03-19 MED ADMIN — pantoprazole (Protonix) injection 40 mg: 40 mg | INTRAVENOUS | @ 13:00:00

## 2024-03-19 MED ADMIN — atorvastatin (LIPITOR) tablet 40 mg: 40 mg | GASTROENTERAL | @ 13:00:00

## 2024-03-19 MED ADMIN — aspirin chewable tablet 81 mg: 81 mg | GASTROENTERAL | @ 13:00:00

## 2024-03-19 MED ADMIN — insulin NPH (HumuLIN,NovoLIN) injection 10 Units: 10 [IU] | SUBCUTANEOUS | @ 22:00:00

## 2024-03-19 MED ADMIN — propofol (DIPRIVAN) infusion 10 mg/mL: 0-40 ug/kg/min | INTRAVENOUS | @ 18:00:00

## 2024-03-19 MED ADMIN — amiodarone (PACERONE) tablet 200 mg: 200 mg | GASTROENTERAL | @ 13:00:00

## 2024-03-19 MED ADMIN — furosemide (LASIX) injection 80 mg: 80 mg | INTRAVENOUS | @ 21:00:00 | Stop: 2024-03-19

## 2024-03-19 MED ADMIN — ceftolozane-tazobactam (ZERBAXA) 750 mg in sodium chloride (NS) 0.9 % 100 mL IVPB: 750 mg | INTRAVENOUS | @ 13:00:00 | Stop: 2024-03-20

## 2024-03-19 MED ADMIN — carboxymethylcellulose sodium (THERATEARS) 0.25 % ophthalmic solution 2 drop: 2 [drp] | OPHTHALMIC | @ 17:00:00

## 2024-03-19 MED ADMIN — ceftolozane-tazobactam (ZERBAXA) 750 mg in sodium chloride (NS) 0.9 % 100 mL IVPB: 750 mg | INTRAVENOUS | @ 20:00:00 | Stop: 2024-03-20

## 2024-03-19 MED ADMIN — arformoterol (BROVANA) nebulizer solution 15 mcg/2 mL: 15 ug | RESPIRATORY_TRACT | @ 15:00:00

## 2024-03-19 MED ADMIN — bisacodyl (DULCOLAX) suppository 10 mg: 10 mg | RECTAL | @ 22:00:00 | Stop: 2024-03-19

## 2024-03-19 NOTE — Unmapped (Signed)
 Adult Nutrition Progress Note      Visit Type: Follow-Up  Reason for Visit: Enteral Nutrition, Parenteral Nutrition    Plan:   Continue PN  Increase dextrose from 150g to 200g***  Check micronutrients: iron panel, ferritin, CRP, zinc, copper, selenium, vitamin D  DC held TF order    Interval Events:  Remains NPO and on PN.   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 This hernia has been present for many years dating back and largely unchanged from CT abdomen/pelvis in 2012 and markedly unchanged on CT from 3/14 from the CT obtained on initial presentation. Though the hernia does make her an increased aspiration risk, it is not the cause of her pulmonary decompensation.strict aspiration precautions, continuing NGT decompression to avoid further aspiration ,? J tube    Daily Estimated Nutrient Needs:  Energy: 1525  1830 kcals 25-30 kcal/kg using estimated dry weight, 61 kg (03/02/24 1026)]  Protein: 90-120 gm [1.5-2.0 gm/kg using estimated dry weight, 61 kg (03/02/24 1026)]    PN start date: restart 3/15 via non-tunnelled CVC  Will continue to follow daily while on PN with full assessments on Mondays    Arcola Jansky MS, RD, LDN, CNSC

## 2024-03-19 NOTE — Unmapped (Signed)
 WOCN Consult Services                                                 Wound Evaluation: Pressure Injury    Reason for Consult:   - Initial  - Pressure Injury    Problem List:   Principal Problem:    SBO (small bowel obstruction) (CMS-HCC)    Assessment: Per EMR: Tammy Braun is a 83 y.o. female with PMH bladder cancer with cystectomy and ileal conduit, COPD, recent dx of afib, moderate-severe TR, CAD (high calcification score), T2DM presented to [facility] with projectile vomiting and abdominal pain, found to have SBO with transition point at level of ileostomy on CT A/P. On initial presentation, labs remarkable for leukocytosis thought to be iso hemoconcentration, AKI on CKD, mildly elevated venous lactate improved with resuscitation. Admitted to [facility] for worsening hypoxia on HFNC requiring intubation in setting of hiatal hernia. OR on 02/28/24 for ex-lap, reduciton of parastomal hernia, small bowel resection with primary anastomosis, lysis of adhesions, abdomen left open with ABThera in place.     WOCN attempted weekly follow up. Patient undergoing bronchoscopy at time of my visit. Will plan to follow up next week. Please continue ordered dressings and ostomy pouching.              Continence Status:   Incontinence of bladder: Foley in place  Incontinent of bowel: Bowel program in place    Moisture Associated Skin Damage:   - Incontinence-associated dermatitis (IAD)     Lab Results   Component Value Date    WBC 11.6 (H) 03/19/2024    HGB 7.6 (L) 03/19/2024    HCT 24.5 (L) 03/19/2024    ESR 53 (H) 12/11/2023    CRP <4.0 12/11/2023    A1C 6.8 (H) 12/11/2023    GLUF 105 08/04/2013    GLU 219 (H) 03/19/2024    POCGLU 262 (H) 03/19/2024    ALBUMIN 1.9 (L) 03/15/2024    PROT 5.7 03/15/2024     Risk Factors:   - Aging  - Cognitive Impairment  - Extended Hospitalization  - Friction/shear  - Immobility  - Moisture  - Multiple co-morbidities    Braden Scale Score: 13 Support Surface:   - Low Air Loss - ICU    Type Debridement Completed By WOCN:  N/A    Teaching:  - Moisture management  - Turning and repositioning  - Wound care    WOCN Recommendations:   - See nursing orders for wound care instructions.  - Contact WOCN with questions, concerns, or wound deterioration.    Topical Therapy/Interventions:   - Crusting (stoma powder or antifungal powder)  - Zinc oxide barrier cream     Urostomy In-patient supplies:   Inpatient supply list: take home quantity listed     Pouch System:  Inpatient supply list: take home quantity listed     Inpatient supply list: take home quantity listed     Pouch System:  [employer] 2-Piece CTF Flat Wafer Blue (mfg [id number]) - lawson number [phone]) order 5  [employer] 2-Piece High Output Pouch with Spout Blue (mfg [id number])- lawson number [phone]) order 5      Accessories:   [employer] (mfg [id number]) - lawson number [phone]) order 5   [employer]oplast Brava Elastic Strip (mfg [id number]) - lawson number [phone]) order 10  Esenta ConvaTec Adhesive Remover Wipes (mfg [id number]) - lawson number [phone]) order 1 box   [employer]ister Stoma Powder (mfg [id number]) - lawson number [phone]) order 1   27M No-Sting Barrier Film- Pads (mfg [id number]) - lawson number ([id number]) order 1 box              Recommended Consults:  - Not Applicable    WOCN Follow Up:  - Weekly    Plan of Care Discussed With:   - Patient  - Family  - RN Primary    Supplies Ordered: No    Workup Time:   45 minutes    Ovidio Kin, RN, BSN, [place], CFCN

## 2024-03-19 NOTE — Unmapped (Signed)
 Problem: Artificial Airway  Goal: Optimal Device Function  Outcome: Ongoing - Unchanged  Note: Patient was intubated this shift and placed on PRVC. Suctioned for a moderate amount of thick white secretions. Bronchoscopy performed without complications. All aerosol medications were administered as ordered. No skin breakdown is noted under ET tube. Respiratory needs met, airway patency maintained.

## 2024-03-19 NOTE — Unmapped (Signed)
 SICU BRONCHOSCOPY PROCEDURE NOTE     DATE OF SERVICE: 03/19/2024    PROCEDURE:  BRONCHOSCOPY    INDICATION:  Airway clearance, Sputum collection      CONSENT:   yes    TIME OUT:    Immediately prior to procedure a time out was called to verify the correct patient, procedure, equipment, and support staff.      POSITION:                 supine      SITE:                          Trach, ETT, NTT, Awake    ULTRASOUND:        N/A               SEDATION MEDS:  Propofol @ 20, fentanyl drip and 2x23mcg push    DESCRIPTION:          BRONCHOSCOPY. Topical nasopharyngeal anesthesia was applied,   the patient was positioned appropriately and the bronchoscope was passed through the ETT. The scope was then passed into the trachea. Careful inspection of the tracheal lumen was accomplished.  The scope was sequentially passed into the left main and then left upper and lower bronchi and segmental bronchi.  The scope was then withdrawn and advanced into the right main bronchus and then into the RUL, RML, and RLL bronchi and segmental bronchi. Therapeutic aspiration was performed to remove mucous plugs/secretions from LUL, LLL.  The bronchial lumens were patent after the intervention. Samples were collected from the LLL and sent for cultures.     FINDINGS:   Copious secretions/plugging in LLL and moderately so in LUL    SPECIMENS SENT:   BAL    COMPLICATIONS:  no complications were noted    BLOOD LOSS:  Minimal    POST-PROCEDURE DIAGNOSIS:  Same    SIGNATURE:             Oliver Hum, MD

## 2024-03-19 NOTE — Unmapped (Signed)
 Palliative Care Progress Note      Consultation from Requesting Attending Physician:  Joanie Coddington, MD  Primary Care Provider:  Vanetta Shawl, MD    Assessment/Plan:      SUMMARY: This 83 y.o. patient is seriously and acutely ill due to small bowel obstruction, complicated by co-morbid acute and chronic conditions including acute hypoxic respiratory failure requiring intubation x2, aspiration, hiatal hernia, s/p OR on 02/28/24 for ex-lap, reduciton of parastomal hernia, small bowel resection with primary anastomosis and lysis of adhesions, AKI on CKD, atrial fibrillation, pulmonary embolism, schizoaffective disorder, HFpEF, pulmonary hypertension, HTN, COPD, bladder cancer with cystectomy and ileal conduit, moderate-severe TR, CAD (high calcification score), T2DM.     Today's visit addressed goals of care    Symptom Assessment and Recommendations:    None needed at this time    Goals of care / ACP:  Code Status:   Code Status: Full Code   Healthcare decision-maker if lacks capacity:   HCDM (patient stated preference) (Active): [name] - Daughter - [phone]      Current goals of care as discussed on 3/19: Continue current medical management. There have been multiple conversations in regards to next steps in plan of care due to worry about impending need for intubation. As this would be her third time requiring intubation, a decision regarding tracheostomy will need to be made. Primary team has discussed this with patient on different occasions and patient stated she would not want a tracheostomy. I made the recommendation to ask her to explain what it would mean if she did not pursue a tracheostomy. It was not clear that she fully understood as she made statements about going home and being fine. Upon Palliative Care visit, patient proactively stated she wants the hole in my neck because it will help me breathe better. She was unable to recall who spoke to her about that. She is alert and oriented and can follow commands however, given her inconsistencies and concern for understanding consequences if specific decisions are made, I worry about her not having capacity to make complex medical decisions for herself.    Patient's daughter [name] was adamant about having a conversation with her mother without the medical teams present. We honored this request. [name] stated again that she knows her mother doesn't want a trach but it is something they would pursue if it would save her life. I gently shared that a tracheostomy would save her life but we want to ensure the life that Tammy Braun would be living is one that is acceptable to her. We know communication and interaction with her family is important as well as the joys of riding around with her family in the car. If this is compromised, which it could be, we want to make sure Ms. Gunnarson is okay with that if that is the path we go down. We reiterated that the medical team has the same goal as [name], which is to care for Ms. Onofre the way she wants [place] to. It was shared that we fully support either path that is taken in her care (intubation with tracheostomy vs transition to comfort-focused) and want to do right by Ms. [name].        Practical, Emotional, Spiritual Support Recommendations:  Palliative care visit today included focused interview, active listening, therapeutic use of silence, offering support, sharing empathy and summarization of today's discussion.      - Participated in multidisciplinary Ethics consult meeting  - Communicated with and  recommendations shared with primary team in person  - Communicated with case management in person  - Communicated with primary RN in person        Thank you for this consult. Please contact Beryle Beams, PA-C or page Palliative Care if there are any questions.     Subjective:     Recent Events: NAEON. Remains on HFNC with persistent high oxygen needs. Asking to sit in the chair and daughter advocating for it as well. It was explained that there are concerns with her high oxygen need that exertion could require further oxygen support including intubation. We offered to have bed placed in chair position but daughter declined.       Objective:     Function:  40% - Ambulation: Mainly bed / Unable to do any work, extensive disease / Self-Care:M [employer] / Intake: Normal or reduced / Level of Conscious: Full , drowsy, or confusion    Temp:  [36.5 ??C (97.7 ??F)-36.9 ??C (98.5 ??F)] 36.5 ??C (97.7 ??F)  Heart Rate:  [103-132] 105  SpO2 Pulse:  [93-129] 108  Resp:  [17-29] 18  BP: (96-165)/(70-137) 165/137  FiO2 (%):  [60 %] 60 %  SpO2:  [94 %-100 %] 100 %    Physical Exam:  Constitutional: chronically ill-appearing female in no acute distress lying in bed with daughter present   Eyes: anicteric sclera, no discharge  ENMT: mucous membranes moist  Pulm: Breathing comfortably on HFNC   Abd: Non-distended  MS: (+) sarcopenia  Skin: Warm, dry  Neuro: Alert; voice hoarse but able to converse a little; repeatedly asking for water or swabs and to get in the chair during visit  Psych: mood and affect euthymic    Testing reviewed and interpreted:   Reviewed and interpreted test results for BMP with elevated Cr; chest x-ray with collapse of left lung affecting assessment of underlying illness severity and prognosis    I personally spent 120 minutes face-to-face and non-face-to-face in the care of this patient, which includes all pre, intra, and post visit time on the date of service.  All documented time was specific to the E/M visit and does not include any procedures that may have been performed.     See ACP Note from today for additional billable service:  No.     Beryle Beams, PA-C  Physician Assistant  Palliative Care

## 2024-03-19 NOTE — Unmapped (Signed)
 [facility] PROGRESS NOTE     Date of Service: 03/19/2024    Hospital Day: LOS: 21 days        Surgery Date: TBD   Surgical Attending: Joanie Coddington, MD    Critical Care Attending: Lia Hopping, MD    Interval History:   Met NN goal of 1-1.5L. Not yet intubated, though family is ready to do so. Continues on HFNC at 60%, 60L.     History of Present Illness:   Tammy Braun is a 83 y.o. female with PMH bladder cancer with cystectomy and ileal conduit, COPD, recent dx of afib, moderate-severe TR, CAD (high calcification score), T2DM presented to [facility] with projectile vomiting and abdominal pain, found to have SBO with transition point at level of ileostomy on CT A/P. On initial presentation, labs remarkable for leukocytosis thought to be iso hemoconcentration, AKI on CKD, mildly elevated venous lactate improved with resuscitation. Admitted to [facility] for worsening hypoxia on HFNC requiring intubation in setting of hiatal hernia. OR on 02/28/24 for ex-lap, reduciton of parastomal hernia, small bowel resection with primary anastomosis and lysis of adhesions.    Hospital Course:  - 02/26/24: Admitted to [facility], floor status, consult to urology for possible ileal conduit revision.   - 02/27/24: Patient level of care escalated to step-down iso increased oxygen needs while in ED.  - 02/28/24: Upgraded to ICU. Intubated at bed-side. OR for ex-lap, reduction of parastomal hernia, small bowel resection with primary anastomosis, lysis of adhesions, abdomen left open with ABThera in place.   - 03/01/24: OR for attempt at abdominal closure. Left open due to desaturations during closure attempt. ABThera in place.  - 03/03/24: RTOR for abdominal closure  -03/06/24: Vas cath placed, CRRT started   -03/09/24: Extubated to HFNC  -03/10/24: CRRT discontinued   - 03/11/24: Re-intubated for hypoxic respiratory failure 2/2 mucus plug   - 03/14/24 Extubated to HFNC   -03/15/24: Vas cath removed   -03/16/24: Placed on BiPap for complete collapse of L lung 2/2 probable mucus plugging  -03/19/24: Re-intubated for mucus plugging. Therapeutic bronch, BAL sent.      ASSESSMENT & PLAN:     Neurologic:  - Pain: tylenol and robaxin SCH  - Discontinue PRN oxy and HM at family request  - Once intubated, begin propofol and fentanyl    #schizoaffective disorder  - Monthly prolixin injection (given 3/14)    Cardiovascular:  - MAP goal > 65    #HFpEF  #Pulm HTN  - Off pressors  - ECHO 3/12- LVEF 70%, moderately dilated RV with moderate systolic dysfunction, severe pulmonary hypertension.    #Hx of HTN  -holding home amlodipine    #Hx of HLD  - atorvastatin    #Afib/flutter w RVR:   - Prior hx of paroxysmal afib. CHADSvasc = 6, no AC PTA per chart review.      > Holding home diltiazem, question efficacy given likely poor absorption  - Amio via NG tube 200mg  daily   - heparin gtt for [facility]     #Pulmonary embolism  - 3/5 CTA chest: Filling defect in L anterior segmental artery  - heparin gtt     #Acute thrombus right basilic   - Identified 3/3 on BUE duplex  - Supportive care, superficial     Respiratory:  #Acute on chronic hypoxemia  #Hx of COPD  - Aggressive pulmonary toilet  - Continue Brovana & atrovent nebs  - Continue hypertonic saline nebs for clearance     #  Aspiration  #Poor pulmonary clearance   - Continue Bipap, ok to break on HFNC for short breaks, comfort, participation in conversions s/p aspiration event 3/17.   - Plan for re-intubation, therapeutic bronch, BAL today    #Pseudomonas Pneumonia   #VAP  - see ID for abx plan    FEN/GI:  F: ML  E: Replete electrolytes PRN  N: NPO, TF held iso multiple aspiration episodes   - Renew TPN  - GI prophylaxis: pepcid  - Bowel regimen: cont miralax and senna, suppository today   - Last BM: 3/17    #S/p ex-lap, reduction of parastomal hernia, small bowel resection with primary anastomosis, lysis of adhesions  - CT 3/13 demonstrating partial small bowel obstruction  - Blake drain#2 removed 3/16  - Blake drain #1 removed 3/17    #Large hiatal hernia, chronic  - re-eval on CT chest/abd/pelvis 3/13 stable    Renal/Genitourinary:  #AKI on CKD  - Nephrology signed off, HD cath removed  - Cr stable, cystatin c remains elevated (~1 higher than Scr)  - hold on diuresis today given plan for intubation and sedation     #H/o bladder cancer s/p radical cystectomy with ileal conduit (2012):  #Parastomal hernia:  - Strict I&Os  - Ok for no foley in stoma at this time per urology    Endocrine:   #hyperglycemia   - Glycemic Control: SSI, increase to NPH 10 unit BID    Hematologic:   - Hgb stable  - Monitor CBC daily, transfuse for hgb 7mg /dL  - DVT ppx: heparin gtt for PE    Immunologic/Infectious Disease:  - Afebrile with resolving leukocytosis     - Cultures:   - Blood cultures (3/4): negative, final   - BAL (3/4): Pseudomonas aeruginosa - susceptible to meropenum    - BAL (3/8): Pseudomonas aeruginosa and candida glabrata  -3/12: Susceptibilities show resistance to Meropenum, Cefepime, Ceftaz and Zosyn.   - Bcx (3/11): Negative, final  - BAL (3/12): 600,000+ psuedomonas (susceptibilities: resistant to meropenum)  - Ucx (3/13): no growth  - BAL (3/20): pending    - Antimicrobials:    - Ceftriaxone 2/27- 2/28 - colonized UTI   - Zosyn (3/1 - 3/5) for abdominal contamination  - Zosyn (3/6) for pseudomonas when sensitivities remained pending  - Zerbaxa (3/12-3/21) for 10 day course  - ID following, appreciate recs    Musculoskeletal:   #Deconditioned   - PT/OT consulted     Daily Care Checklist:   - Stress Ulcer Prevention: No  - DVT Prophylaxis: Chemical: Heparin drip  - Daily Awakening: Yes  - Spontaneous Breathing Trial: Yes  - Indication for Central/PICC Line: Yes  Infusions requiring central access, Hemodynamic monitoring, and Inadequate peripheral access, LIJ triple lumen  - Indication for Urinary Catheter: No   - Diagnostic images/reports of past 24hrs reviewed: Yes    Disposition:   - Continue ICU care  - PT/OT consulted:yes SUBJECTIVE:      Alert and appropriate. Follows commands. Denies pain. Asking for ostomy bag to be replaced.     OBJECTIVE:     Physical Exam:  Constitutional: Sitting in bed, in moderate respiratory distress  Neurologic: Opens eyes to voice. Nods yes/no appropriately. Pupils equal and responsive to light.   Respiratory: HFNC at 60%, 60L  Cardiovascular:  Irregularly irregular rhythm ~110bpm   Gastrointestinal: Soft abdomen, midline incision staples in tact. Urostomy draining clear yellow urine.   Musculoskeletal: Trace BLE/BUE edema.  Skin: Warm and dry  Temp:  [36.8 ??C (98.2 ??F)-36.9 ??C (98.5 ??F)] 36.9 ??C (98.5 ??F)  Heart Rate:  [105-132] 132  SpO2 Pulse:  [101-129] 120  Resp:  [17-29] 24  BP: (96-136)/(64-99) 99/74  MAP (mmHg):  [74-102] 81  FiO2 (%):  [60 %-65 %] 60 %  SpO2:  [94 %-100 %] 96 %     Recent Laboratory Results:  No results for input(s): SPECTYPEART, PHART, PCO2ART, PO2ART, HCO3ART, BEART, O2SATART in the last 24 hours.        Recent Labs     Units 03/17/24  2026 03/17/24  2335 03/18/24  0325 03/18/24  1601   NA mmol/L 142 140 142 144   K mmol/L 4.1 3.8 3.8 3.7   CL mmol/L 105  --  109* 109*   CO2 mmol/L 25.0  --  26.0 25.0   BUN mg/dL 55*  --  60* 59*   CREATININE mg/dL 1.61*  --  0.96* 0.45*   GLU mg/dL 409*  --  811* 914     Lab Results   Component Value Date    BILITOT 0.3 03/15/2024    BILITOT 0.4 03/14/2024    BILIDIR 0.20 03/15/2024    BILIDIR 0.20 03/14/2024    ALT 10 03/15/2024    ALT 11 03/14/2024    AST 13 03/15/2024    AST 13 03/14/2024    GGT 16 09/17/2011    GGT 354 (H) 05/03/2011    ALKPHOS 60 03/15/2024    ALKPHOS 58 03/14/2024    PROT 5.7 03/15/2024    PROT 5.4 (L) 03/14/2024    ALBUMIN 1.9 (L) 03/15/2024    ALBUMIN 1.8 (L) 03/14/2024     Recent Labs     Units 03/18/24  0953 03/18/24  1154 03/18/24  1656 03/19/24  0057   POCGLU mg/dL 782* 956* 213 086*     Recent Labs     Units 03/16/24  0424 03/17/24  0424 03/18/24  0325   WBC 10*9/L 8.9 11.9* 14.0*   RBC 10*12/L 2.73* 3.13* 2.90*   HGB g/dL 7.6* 8.4* 7.8*   HCT % 23.2* 26.8* 24.7*   MCV fL 85.0 85.7 85.0   MCH pg 27.6 26.7 26.9   MCHC g/dL 57.8 46.9* 62.9*   RDW % 17.8* 18.1* 17.4*   PLT 10*9/L 240 290 256   MPV fL 9.9 9.4 9.6     No results for input(s): INR, APTT in the last 24 hours.    Invalid input(s): PTPATIENT     Lines & Tubes:   Patient Lines/Drains/Airways Status       Active Peripheral & Central Intravenous Access       Name Placement date Placement time Site Days    CVC Triple Lumen 03/07/24 Non-tunneled Left Internal jugular 03/07/24  0849  Internal jugular  11                     Patient Lines/Drains/Airways Status       Active Wounds       Name Placement date Placement time Site Days    Surgical Site 04/08/18 Shoulder Left 04/08/18  1338  -- 2171    Surgical Site 06/05/22 Eye Left 06/05/22  0928  -- 652    Surgical Site 06/19/22 Eye Right 06/19/22  1024  -- 638    Surgical Site 03/03/24 Abdomen 03/03/24  1013  -- 15    Wound 06/24/20 Other (comment) Buttocks inner right buttock 06/24/20  1010  Buttocks  1363  Wound 03/11/24 Other (comment) Sacrum Mid moisture/friction injury 03/11/24  1200  Sacrum  7    Wound 03/12/24 Other (comment) Groin Right 03/12/24  1500  Groin  6    Wound 03/13/24 Neck Right open area note arount the HD catheter durng the dressing change 03/13/24  0930  Neck  5                     Respiratory/ventilator settings for last 24 hours:   FiO2 (%): 60 %    Intake/Output last 3 shifts:  I/O last 3 completed shifts:  In: 1820.7 [I.V.:267; IV Piggyback:304.7]  Out: 4800 [Urine:4600; Emesis/NG output:200]    Daily/Recent Weight:  73.9 kg (162 lb 14.7 oz)    BMI:  Body mass index is 30.8 kg/m??.    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    Chronic kidney disease     Depression, psychotic (CMS-HCC)     Diabetes mellitus (CMS-HCC) in past    Emphysema of lung (CMS-HCC)     Financial difficulties     Frail elderly     Hearing impairment     Hernia     History of transfusion     Hyperlipidemia     Hypertension     Impaired mobility     Osteoporosis     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/08/2015    Visual impairment      Past Surgical History:   Procedure Laterality Date    ABDOMINAL SURGERY      BLADDER SURGERY      BREAST CYST EXCISION      CHEMOTHERAPY  2012    bladder    GALLBLADDER SURGERY      stone removal    ILEOSTOMY  2012    PR COLONOSCOPY FLX DX W/COLLJ SPEC WHEN PFRMD N/A 02/09/2015    Procedure: COLONOSCOPY, FLEXIBLE, PROXIMAL TO SPLENIC FLEXURE; DIAGNOSTIC, W/WO COLLECTION SPECIMEN BY BRUSH OR WASH;  Surgeon: Dewaine Conger, MD;  Location: [facility] [place];  Service: Gastroenterology    PR EXPLORATORY OF ABDOMEN N/A 02/28/2024    Procedure: EXPLORATORY LAPAROTOMY, EXPLORATORY CELIOTOMY WITH OR WITHOUT BIOPSY(S);  Surgeon: Renda Rolls, MD;  Location: [facility];  Service: General Surgery    PR RECONSTR TOTAL SHOULDER IMPLANT Left 04/08/2018    Procedure: ARTHROPLASTY, GLENOHUMERAL JOINT; TOTAL SHOULDER(GLENOID & PROXIMAL HUMERAL REPLACEMENT(EG, TOTAL SHOULDER);  Surgeon: Tomasa Rand, MD;  Location: [facility] OR [facility];  Service: Ortho Sports Medicine    PR REOPEN RECENT ABD EXPLORATORY Midline 03/01/2024    Procedure: REOPENING OF RECENT LAPAROTOMY;  Surgeon: [name]., MD;  Location: OR [facility];  Service: Trauma    PR REOPEN RECENT ABD EXPLORATORY N/A 03/03/2024    Procedure: REOPENING OF RECENT LAPAROTOMY;  Surgeon: Joanie Coddington, MD;  Location: OR [facility];  Service: Trauma    PR SIGMOIDOSCOPY,BIOPSY N/A 03/11/2015    Procedure: SIGMOIDOSCOPY, FLEXIBLE; WITH BIOPSY, SINGLE OR MULTIPLE;  Surgeon: Wilburt Finlay, MD;  [facility];  Service: Gastroenterology    PR XCAPSL CTRC RMVL INSJ IO LENS PROSTH W/O ECP Left 06/05/2022    Procedure: EXTRACAPSULAR CATARACT REMOVAL W/INSERTION OF INTRAOCULAR LENS PROSTHESIS, MANUAL OR MECHANICAL TECHNIQUE  WITHOUT ENDOSCOPIC CYCLOPHOTOCOAGULATION;  Surgeon: Garner Gavel, MD;  Location: [facility] OR [facility];  Service: Ophthalmology    PR XCAPSL CTRC RMVL INSJ IO LENS PROSTH W/O ECP Right 06/19/2022    Procedure: EXTRACAPSULAR CATARACT REMOVAL W/INSERTION OF INTRAOCULAR LENS PROSTHESIS, MANUAL OR MECHANICAL TECHNIQUE WITHOUT ENDOSCOPIC CYCLOPHOTOCOAGULATION;  Surgeon: Garner Gavel, MD;  Location: [facility] OR [facility];  Service: Ophthalmology     Scheduled Medications:   acetaminophen  1,000 mg Intravenous Q8H    amiodarone  200 mg Enteral tube: gastric Daily    arformoterol  15 mcg Nebulization BID (RT)    aspirin  81 mg Enteral tube: gastric Daily    atorvastatin  40 mg Enteral tube: gastric Daily    carboxymethylcellulose sodium  2 drop Both Eyes TID    ceftolozane-tazobactam  750 mg Intravenous Q8H    flu vacc [id number](77yr up)-PF  0.5 mL Intramuscular During hospitalization    fluPHENAZine decanoate  50 mg Intramuscular Q30 Days    insulin NPH  7 Units Subcutaneous BID AC    insulin regular  0-20 Units Subcutaneous Q6H SCH    ipratropium  500 mcg Nebulization Q6H (RT)    pantoprazole (Protonix) intravenous solution  40 mg Intravenous Daily    polyethylene glycol  17 g Enteral tube: gastric Daily    sennosides  2.5 mL Enteral tube: gastric Nightly    sodium chloride  10 mL Intravenous Q8H    sodium chloride  10 mL Intravenous Q8H    sodium chloride  10 mL Intravenous Q8H    sodium chloride  4 mL Nebulization Q6H (RT)     Continuous Infusions:   Parenteral Nutrition (CENTRAL) 45 mL/hr at 03/19/24 0200    And    fat emulsion 20 % with fish oil 20.8 mL/hr at 03/19/24 0200    heparin 18 Units/kg/hr (03/19/24 0200)     PRN Medications:  albuterol, dextrose in water, heparin (porcine), heparin (porcine), melatonin, nicotine polacrilex **OR** nicotine polacrilex, ondansetron, phenol    Oliver Hum, MD   Surgical Intensive Care Unit  [facility]

## 2024-03-19 NOTE — Unmapped (Signed)
 Biliary & Advanced Endoscopy Consult Service   Initial Consultation         Assessment and Recommendations:   Tammy Braun is a 83 y.o. female with a PMHx of bladder cancer with cystectomy and ileal conduit, COPD, recent dx of afib, moderate-severe TR, CAD (high calcification score), T2DM presented to [facility] with projectile vomiting and abdominal pain, found to have SBO with transition point at level of ileostomy. The patient is seen in consultation at the request of Lauren [name], MD ([facility])) for need for percutaneous endoscopic jejunostomy (PEJ) tube placement.    need for percutaneous endoscopic jejunostomy (PEJ) tube placement: remains critically ill in ICU. Patient has not tolerated NGT feeds in past due to aspiration in the setting of large bochdalek hernia. Trying to transition off TPN with more durable enteral feeding access with a jejunostomy tube. Last CT 3/12 questioned partial SBO, but primary team believes this to be resolved as patient having regular BMs and has tolerating NGT feeds since (until aspiration event). Reasonable to attempt PEJ placement but unclear if there will be window for transillumination and placement given large midline abdominal wound. Additionally, patient's HCDM has declined to proceed with feeding tube placement at this time citing she would like to give the patient more time to stabilize following recent intubation.  Procedure is non-urgent so we can reconsider in future if/when patient's family would like to proceed.     Recommendations:   - Please re-consult [place] if/when patient or HCDM desires to proceed with PEJ placement. She would need heparin gtt held prior.    - we will sign off at this time.     Issues Impacting Complexity of Management:  -Discussed the risks/benefits of small bowel enteroscopy with PEJ placement, a major procedure, with the patient/family including, but not limited to, bleeding, infection, perforation, pancreatitis, damage to surrounding organs/structures, and device malfunction. The patient/family did not consent to the procedure.  -The patient is at high-risk of complications from their planned GI procedure due to acute hypoxemic respiratory failure, advanced age, and severe debility/deconditioning    Recommendations discussed with the patient's primary team. We will continue to follow along with you.    History:   83 y.o. female with PMH bladder cancer with cystectomy and ileal conduit, COPD, recent dx of afib, moderate-severe TR, CAD (high calcification score), T2DM presented to [facility] with projectile vomiting and abdominal pain, found to have SBO with transition point at level of ileostomy on CT A/P.  Now s/p OR on 02/28/24 for ex-lap, reduciton of parastomal hernia, small bowel resection with primary anastomosis and lysis of adhesions. Hospital course has been complicated by prolonged ICU stay for hypoxia intermittently requiring inutbation, CRRT, (now discontinued). Goals of care conversation with family revisited today and decision made to reintubate and sedate patient for hypoxia today. Failed NGT feeds due to aspiration. Has been on TPN but consult is now for PEJ placement. This morning patient's family was feeling inclined to proceed with feeding tube placement but have now changed their mind -- daughter feels her mother needs more time to recover from recent events specifically intubation this morning.      Objective:   Temp:  [36.5 ??C (97.7 ??F)-36.9 ??C (98.5 ??F)] 36.6 ??C (97.9 ??F)  Heart Rate:  [84-132] 87  SpO2 Pulse:  [79-129] 97  Resp:  [15-29] 15  BP: (68-165)/(31-137) 102/68  FiO2 (%):  [60 %-100 %] 60 %  SpO2:  [96 %-100 %] 100 %  Gen: Acutely ill-appearing female in NAD, sedated and intubated, unable to answer questions due to acute illness  Eyes: Sclera anicteric  Abdomen: Normoactive bowel sounds, soft, NTND  Extremities: No edema in the BLEs    Pertinent Labs/Studies:  -I have reviewed the patient's labs from 03/19/24 which show stable Hgb, stable renal function (SCr, electrolytes), and stable LFTs

## 2024-03-19 NOTE — Unmapped (Incomplete)
 [facility]U PROGRESS NOTE     Date of Service: 03/19/2024    Hospital Day: LOS: 21 days        Surgery Date: TBD   Surgical Attending: Joanie Coddington, MD    Critical Care Attending: Lia Hopping, MD    Interval History:   Met NN goal of 1-1.5L. Not yet intubated, though family is ready to do so. Continues on HFNC at 60%, 60L.     History of Present Illness:   Tammy Braun is a 83 y.o. female with PMH bladder cancer with cystectomy and ileal conduit, COPD, recent dx of afib, moderate-severe TR, CAD (high calcification score), T2DM presented to [facility] with projectile vomiting and abdominal pain, found to have SBO with transition point at level of ileostomy on CT A/P. On initial presentation, labs remarkable for leukocytosis thought to be iso hemoconcentration, AKI on CKD, mildly elevated venous lactate improved with resuscitation. Admitted to [facility] for worsening hypoxia on HFNC requiring intubation in setting of hiatal hernia. OR on 02/28/24 for ex-lap, reduciton of parastomal hernia, small bowel resection with primary anastomosis and lysis of adhesions.    Hospital Course:  - 02/26/24: Admitted to [facility], floor status, consult to urology for possible ileal conduit revision.   - 02/27/24: Patient level of care escalated to step-down iso increased oxygen needs while in ED.  - 02/28/24: Upgraded to ICU. Intubated at bed-side. OR for ex-lap, reduction of parastomal hernia, small bowel resection with primary anastomosis, lysis of adhesions, abdomen left open with ABThera in place.   - 03/01/24: OR for attempt at abdominal closure. Left open due to desaturations during closure attempt. ABThera in place.  - 03/03/24: RTOR for abdominal closure  -03/06/24: Vas cath placed, CRRT started   -03/09/24: Extubated to HFNC  -03/10/24: CRRT discontinued   - 03/11/24: Re-intubated for hypoxic respiratory failure 2/2 mucus plug   - 03/14/24 Extubated to HFNC   -03/15/24: Vas cath removed   -03/16/24: Placed on BiPap for complete collapse of L lung 2/2 probable mucus plugging  -03/19/24: Re-intubated for mucus plugging. Therapeutic bronch, BAL sent.      ASSESSMENT & PLAN:     Neurologic:  - Pain: tylenol and robaxin SCH  - Discontinue PRN oxy and HM at family request  - Once intubated, begin propofol and fentanyl    #schizoaffective disorder  - Monthly prolixin injection (given 3/14)    Cardiovascular:  - MAP goal > 65    #HFpEF  #Pulm HTN  - Off pressors  - ECHO 3/12- LVEF 70%, moderately dilated RV with moderate systolic dysfunction, severe pulmonary hypertension.    #Hx of HTN  -holding home amlodipine    #Hx of HLD  - atorvastatin    #Afib/flutter w RVR:   - Prior hx of paroxysmal afib. CHADSvasc = 6, no AC PTA per chart review.      > Holding home diltiazem, question efficacy given likely poor absorption  - Amio via NG tube 200mg  daily   - heparin gtt for [facility]     #Pulmonary embolism  - 3/5 CTA chest: Filling defect in L anterior segmental artery  - heparin gtt     #Acute thrombus right basilic   - Identified 3/3 on BUE duplex  - Supportive care, superficial     Respiratory:  #Acute on chronic hypoxemia  #Hx of COPD  - Aggressive pulmonary toilet  - Continue Brovana & atrovent nebs  - Continue hypertonic saline nebs for clearance     #  Aspiration  #Poor pulmonary clearance   - Continue Bipap, ok to break on HFNC for short breaks, comfort, participation in conversions s/p aspiration event 3/17.   - Plan for re-intubation, therapeutic bronch, BAL today    #Pseudomonas Pneumonia   #VAP  - see ID for abx plan    FEN/GI:  F: ML  E: Replete electrolytes PRN  N: NPO, TF held iso multiple aspiration episodes   - Renew TPN  - GI prophylaxis: pepcid  - Bowel regimen: cont miralax and senna, suppository today   - Last BM: 3/17    #S/p ex-lap, reduction of parastomal hernia, small bowel resection with primary anastomosis, lysis of adhesions  - CT 3/13 demonstrating partial small bowel obstruction  - Blake drain#2 removed 3/16  - Blake drain #1 removed 3/17    #Large hiatal hernia, chronic  - re-eval on CT chest/abd/pelvis 3/13 stable    Renal/Genitourinary:  #AKI on CKD  - Nephrology signed off, HD cath removed  - Cr stable, cystatin c remains elevated (~1 higher than Scr)  - hold on diuresis today given plan for intubation and sedation     #H/o bladder cancer s/p radical cystectomy with ileal conduit (2012):  #Parastomal hernia:  - Strict I&Os  - Ok for no foley in stoma at this time per urology    Endocrine:   #hyperglycemia   - Glycemic Control: SSI, increase to NPH 10 unit BID    Hematologic:   - Hgb stable  - Monitor CBC daily, transfuse for hgb 7mg /dL  - DVT ppx: heparin gtt for PE    Immunologic/Infectious Disease:  - Afebrile with resolving leukocytosis     - Cultures:   - Blood cultures (3/4): negative, final   - BAL (3/4): Pseudomonas aeruginosa - susceptible to meropenum    - BAL (3/8): Pseudomonas aeruginosa and candida glabrata  -3/12: Susceptibilities show resistance to Meropenum, Cefepime, Ceftaz and Zosyn.   - Bcx (3/11): Negative, final  - BAL (3/12): 600,000+ psuedomonas (susceptibilities: resistant to meropenum)  - Ucx (3/13): no growth  - BAL (3/20): pending    - Antimicrobials:    - Ceftriaxone 2/27- 2/28 - colonized UTI   - Zosyn (3/1 - 3/5) for abdominal contamination  - Zosyn (3/6) for pseudomonas when sensitivities remained pending  - Zerbaxa (3/12-3/21) for 10 day course  - ID following, appreciate recs    Musculoskeletal:   #Deconditioned   - PT/OT consulted     Daily Care Checklist:   - Stress Ulcer Prevention: No  - DVT Prophylaxis: Chemical: Heparin drip  - Daily Awakening: Yes  - Spontaneous Breathing Trial: Yes  - Indication for Central/PICC Line: Yes  Infusions requiring central access, Hemodynamic monitoring, and Inadequate peripheral access, LIJ triple lumen  - Indication for Urinary Catheter: No   - Diagnostic images/reports of past 24hrs reviewed: Yes    Disposition:   - Continue ICU care  - PT/OT consulted:yes SUBJECTIVE:      Alert and appropriate. Follows commands. Denies pain. Asking for ostomy bag to be replaced.     OBJECTIVE:     Physical Exam:  Constitutional: Sitting in bed, in moderate respiratory distress  Neurologic: Opens eyes to voice. Nods yes/no appropriately. Pupils equal and responsive to light.   Respiratory: HFNC at 60%, 60L  Cardiovascular:  Irregularly irregular rhythm ~110bpm   Gastrointestinal: Soft abdomen, midline incision staples in tact. Urostomy draining clear yellow urine.   Musculoskeletal: Trace BLE/BUE edema.  Skin: Warm and dry  Temp:  [36.5 ??C (97.7 ??F)-36.9 ??C (98.5 ??F)] 36.7 ??C (98.1 ??F)  Heart Rate:  [84-132] 123  SpO2 Pulse:  [79-129] 124  Resp:  [14-29] 16  BP: (68-165)/(31-137) 118/59  MAP (mmHg):  [62-147] 78  FiO2 (%):  [40 %-100 %] 40 %  SpO2:  [96 %-100 %] 100 %     Recent Laboratory Results:  Recent Labs     Units 03/19/24  1637   PHART  7.35   PCO2ART mm Hg 43.5   PO2ART mm Hg 179.0*   HCO3ART mmol/L 23   BEART  -1.7   O2SATART % 99.8           Recent Labs     Units 03/18/24  1601 03/19/24  0548 03/19/24  1718   NA mmol/L 144 147* 146*   K mmol/L 3.7 3.7 3.8   CL mmol/L 109* 109* 110*   CO2 mmol/L 25.0 26.0 26.0   BUN mg/dL 59* 65* 66*   CREATININE mg/dL 1.61* 0.96* 0.45*   GLU mg/dL 409 811* 914*     Lab Results   Component Value Date    BILITOT 0.3 03/15/2024    BILITOT 0.4 03/14/2024    BILIDIR 0.20 03/15/2024    BILIDIR 0.20 03/14/2024    ALT 10 03/15/2024    ALT 11 03/14/2024    AST 13 03/15/2024    AST 13 03/14/2024    GGT 16 09/17/2011    GGT 354 (H) 05/03/2011    ALKPHOS 60 03/15/2024    ALKPHOS 58 03/14/2024    PROT 5.7 03/15/2024    PROT 5.4 (L) 03/14/2024    ALBUMIN 1.9 (L) 03/15/2024    ALBUMIN 1.8 (L) 03/14/2024     Recent Labs     Units 03/19/24  0547 03/19/24  0831 03/19/24  1304 03/19/24  1717   POCGLU mg/dL 782* 956* 213* 086     Recent Labs     Units 03/17/24  0424 03/18/24  0325 03/19/24  0548   WBC 10*9/L 11.9* 14.0* 11.6*   RBC 10*12/L 3.13* 2.90* 2.86* HGB g/dL 8.4* 7.8* 7.6*   HCT % 26.8* 24.7* 24.5*   MCV fL 85.7 85.0 85.7   MCH pg 26.7 26.9 26.7   MCHC g/dL 57.8* 46.9* 62.9*   RDW % 18.1* 17.4* 18.4*   PLT 10*9/L 290 256 268   MPV fL 9.4 9.6 9.4     Recent Labs     Units 03/19/24  0548   APTT sec 95.2*        Lines & Tubes:   Patient Lines/Drains/Airways Status       Active Peripheral & Central Intravenous Access       Name Placement date Placement time Site Days    CVC Triple Lumen 03/07/24 Non-tunneled Left Internal jugular 03/07/24  0849  Internal jugular  12                     Patient Lines/Drains/Airways Status       Active Wounds       Name Placement date Placement time Site Days    Surgical Site 04/08/18 Shoulder Left 04/08/18  1338  -- 2172    Surgical Site 06/05/22 Eye Left 06/05/22  0928  -- 653    Surgical Site 06/19/22 Eye Right 06/19/22  1024  -- 639    Surgical Site 03/03/24 Abdomen 03/03/24  1013  -- 16    Wound 06/24/20 Other (comment) Buttocks inner right buttock  06/24/20  1010  Buttocks  1364    Wound 03/11/24 Other (comment) Sacrum Mid moisture/friction injury 03/11/24  1200  Sacrum  8    Wound 03/12/24 Other (comment) Groin Right 03/12/24  1500  Groin  7    Wound 03/13/24 Neck Right open area note arount the HD catheter durng the dressing change 03/13/24  0930  Neck  6                     Respiratory/ventilator settings for last 24 hours:   Vent Mode: PRVC  FiO2 (%): 40 %  S RR: 15  S VT: 320 mL  PEEP: 8 cm H20    Intake/Output last 3 shifts:  I/O last 3 completed shifts:  In: 3018.1 [I.V.:440.6; IV Piggyback:462.8]  Out: 4900 [Urine:4700; Emesis/NG output:200]    Daily/Recent Weight:  69.9 kg (154 lb 1.6 oz)    BMI:  Body mass index is 29.13 kg/m??.    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    Chronic kidney disease     Depression, psychotic (CMS-HCC)     Diabetes mellitus (CMS-HCC)     in past    Emphysema of lung (CMS-HCC)     Financial difficulties     Frail elderly     Hearing impairment     Hernia     History of transfusion     Hyperlipidemia     Hypertension     Impaired mobility     Osteoporosis     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/08/2015    Visual impairment      Past Surgical History:   Procedure Laterality Date    ABDOMINAL SURGERY      BLADDER SURGERY      BREAST CYST EXCISION      CHEMOTHERAPY  2012    bladder    GALLBLADDER SURGERY      stone removal    ILEOSTOMY  2012    PR COLONOSCOPY FLX DX W/COLLJ SPEC WHEN PFRMD N/A 02/09/2015    Procedure: COLONOSCOPY, FLEXIBLE, PROXIMAL TO SPLENIC FLEXURE; DIAGNOSTIC, W/WO COLLECTION SPECIMEN BY BRUSH OR WASH;  Surgeon: Dewaine Conger, MD;  Location: [facility]ED[facility] [place];  Service: Gastroenterology    PR EXPLORATORY OF ABDOMEN N/A 02/28/2024    Procedure: EXPLORATORY LAPAROTOMY, EXPLORATORY CELIOTOMY WITH OR WITHOUT BIOPSY(S);  Surgeon: Renda Rolls, MD;  Location: [facility];  Service: General Surgery    PR RECONSTR TOTAL SHOULDER IMPLANT Left 04/08/2018    Procedure: ARTHROPLASTY, GLENOHUMERAL JOINT; TOTAL SHOULDER(GLENOID & PROXIMAL HUMERAL REPLACEMENT(EG, TOTAL SHOULDER);  Surgeon: Tomasa Rand, MD;  Location[facility];  Service: Ortho Sports Medicine    PR REOPEN RECENT ABD EXPLORATORY Midline 03/01/2024    Procedure: REOPENING OF RECENT LAPAROTOMY;  Surgeon: [name]., MD;  Location: OR [facility];  Service: Trauma    PR REOPEN RECENT ABD EXPLORATORY N/A 03/03/2024    Procedure: REOPENING OF RECENT LAPAROTOMY;  Surgeon: Joanie Coddington, MD;  Location: OR [facility];  Service: Trauma    PR SIGMOIDOSCOPY,BIOPSY N/A 03/11/2015    Procedure: SIGMOIDOSCOPY, FLEXIBLE; WITH BIOPSY, SINGLE OR MULTIPLE;  Surgeon: Wilburt Finlay, MD;  [facility]: [facility];  Service:  Gastroenterology    PR XCAPSL CTRC RMVL INSJ IO LENS PROSTH W/O ECP Left 06/05/2022 Procedure: EXTRACAPSULAR CATARACT REMOVAL W/INSERTION OF INTRAOCULAR LENS PROSTHESIS, MANUAL OR MECHANICAL TECHNIQUE WITHOUT ENDOSCOPIC CYCLOPHOTOCOAGULATION;  Surgeon: Garner Gavel, MD;  Location: [facility] OR [facility];  Service: Ophthalmology    PR XCAPSL CTRC RMVL INSJ IO LENS PROSTH W/O ECP Right 06/19/2022    Procedure: EXTRACAPSULAR CATARACT REMOVAL W/INSERTION OF INTRAOCULAR LENS PROSTHESIS, MANUAL OR MECHANICAL TECHNIQUE WITHOUT ENDOSCOPIC CYCLOPHOTOCOAGULATION;  Surgeon: Garner Gavel, MD;  Location: [facility] OR [facility];  Service: Ophthalmology     Scheduled Medications:   acetaminophen  1,000 mg Enteral tube: gastric Q8H    amiodarone  200 mg Enteral tube: gastric Daily    arformoterol  15 mcg Nebulization BID (RT)    aspirin  81 mg Enteral tube: gastric Daily    atorvastatin  40 mg Enteral tube: gastric Daily    carboxymethylcellulose sodium  2 drop Both Eyes TID    ceftolozane-tazobactam  750 mg Intravenous Q8H    flu vacc [id number](68yr up)-PF  0.5 mL Intramuscular During hospitalization    fluPHENAZine decanoate  50 mg Intramuscular Q30 Days    insulin NPH  10 Units Subcutaneous BID AC    insulin regular  0-20 Units Subcutaneous Q6H SCH    ipratropium  500 mcg Nebulization Q6H (RT)    pantoprazole (Protonix) intravenous solution  40 mg Intravenous Daily    polyethylene glycol  17 g Enteral tube: gastric Daily    sennosides  2.5 mL Enteral tube: gastric Nightly    sodium chloride  10 mL Intravenous Q8H    sodium chloride  10 mL Intravenous Q8H    sodium chloride  10 mL Intravenous Q8H    sodium chloride  4 mL Nebulization Q6H (RT)     Continuous Infusions:   Parenteral Nutrition (CENTRAL)      And    fat emulsion 20 % with fish oil      fentaNYL citrate (PF) 50 mcg/mL infusion 25 mcg/hr (03/19/24 1800)    heparin 18 Units/kg/hr (03/19/24 1800)    NORepinephrine bitartrate-NS 4 mcg/min (03/19/24 1800)    Parenteral Nutrition (CENTRAL) 45 mL/hr at 03/19/24 1800    propofol 10 mg/mL infusion 10 mcg/kg/min (03/19/24 1800)     PRN Medications:  albuterol, calcium gluconate, calcium gluconate, dextrose in water, fentaNYL (PF) **OR** fentaNYL (PF), heparin (porcine), heparin (porcine), magnesium sulfate in water, melatonin, nicotine polacrilex **OR** nicotine polacrilex, ondansetron, phenol, potassium chloride in water    Audry Pili, PA   Surgical Intensive Care Unit  [facility]

## 2024-03-19 NOTE — Unmapped (Signed)
 I was called to bedside by nursing at the request of the patient's daughter, [name]. Ms. Cathlean Cower stated that she did not want her mother to be restrained. I informed her that her mother had been restrained at the time of intubation to ensure that she did not mistakenly reach for her ET tube or lines. I further informed her that particularly during the initial portion of her intubation this was medically prudent as she was sedated and not interactive or following commands.  We discussed that her mother has done quite well in the past while intubated and usually remains calm and follows commands.  I stated that I was happy to remove the 2-point restraints when Ms. Cathlean Cower or a family member was at bedside with her, but I recommended replacing the restraints when there was no one in the room due to the risk of injury, complication, or death should the tubes or lines become dislodged or removed unintentionally.  Ms. Cathlean Cower stated my mother won't do that.  I informed her that my concern was not that she would do it intentionally, but that it could happen by mistake or during a moment of altered sensorium. Ms. Cathlean Cower continued to insist I know my mother and she will not do that. She went on to say This was her choice and she knows that she can't do that. I reiterated that my concern was not that she would remove them volitionally, but that she could become delirious or otherwise altered and remove a device by accident.  I attempted to ensure that Ms. Cathlean Cower understood the risks of removing the restraints, but she refused to engage in a direct discussion of these risks.  She spoke tangentially about risks and stated I understand that if you don't do your job, she could die. I informed her that to accept a patient's decision or, in this case, a medical power of attorney's decision, that we needed to ensure capacity.  I informed her that a critical component of this evaluation is that the decision maker must demonstrate an understanding of the information relevant to the decision to include the reasonably foreseeable risks and consequences of this decision. I asked her to tell me what the risk of this decision would be and she again refused. She accused me of standing on her dignity and told me I was being difficult. After roughly 10 minutes of her continued unwillingness to engage in a discussion of these specific risks she stated yes I understand that if her tubes come out before her lungs have healed that she could do poorly.  Given this acknowledgment, I informed her that I would remove the restraints, cancel the order for restraints, and inform the critical care team not to utilize restraints in the future. I did, however, tell her that if she became a danger to staff that her restraints would be reapplied per unit protocol.    Delrae Sawyers  Fellow, Surgical Critical Care

## 2024-03-19 NOTE — Unmapped (Addendum)
 Intubated this am around 1000. Vent on PRVC. Bronchoscopy performed around 1200. Fentanyl, propofol, and levo gtts maintained. Heparin gtt continued. TPN continued. NGT remains in place at [facility]. Afib continued. Adequate output via urostomy. Bilateral wrist restraints continued. Turned q2 hours to prevent skin breakdown. Plan of care discussed with family at bedside. ICU status.  Problem: Adult Inpatient Plan of Care  Goal: Plan of Care Review  Outcome: Progressing  Goal: Patient-Specific Goal (Individualized)  Outcome: Progressing  Goal: Absence of Hospital-Acquired Illness or Injury  Outcome: Progressing  Intervention: Identify and Manage Fall Risk  Recent Flowsheet Documentation  Taken 03/19/2024 0800 by Linda Hedges, RN  Safety Interventions:   bed alarm   fall reduction program maintained   lighting adjusted for tasks/safety   low bed   nonskid shoes/slippers when out of bed   toileting scheduled   supervised activity  Intervention: Prevent Skin Injury  Recent Flowsheet Documentation  Taken 03/19/2024 1600 by Linda Hedges, RN  Positioning for Skin: Right  Taken 03/19/2024 1400 by Linda Hedges, RN  Positioning for Skin: Left  Taken 03/19/2024 1200 by Linda Hedges, RN  Positioning for Skin: Right  Taken 03/19/2024 1000 by Linda Hedges, RN  Positioning for Skin: Left  Taken 03/19/2024 0800 by Linda Hedges, RN  Positioning for Skin: Right  Device Skin Pressure Protection:   absorbent pad utilized/changed   adhesive use limited   positioning supports utilized   pressure points protected   skin-to-device areas padded   skin-to-skin areas padded  Skin Protection:   adhesive use limited   skin-to-device areas padded   skin-to-skin areas padded   transparent dressing maintained   tubing/devices free from skin contact  Intervention: Prevent and Manage VTE (Venous Thromboembolism) Risk  Recent Flowsheet Documentation  Taken 03/19/2024 1600 by Linda Hedges, RN  Anti-Embolism Device Type: SCD, Knee  Anti-Embolism Device Status: On  Anti-Embolism Device Location: BLE  Taken 03/19/2024 1400 by Linda Hedges, RN  Anti-Embolism Device Type: SCD, Knee  Anti-Embolism Device Status: On  Anti-Embolism Device Location: BLE  Taken 03/19/2024 1200 by Linda Hedges, RN  Anti-Embolism Device Type: SCD, Knee  Anti-Embolism Device Status: On  Anti-Embolism Device Location: BLE  Taken 03/19/2024 1000 by Linda Hedges, RN  Anti-Embolism Device Type: SCD, Knee  Anti-Embolism Device Status: On  Anti-Embolism Device Location: BLE  Taken 03/19/2024 0800 by Linda Hedges, RN  Anti-Embolism Device Type: SCD, Knee  Anti-Embolism Device Status: On  Anti-Embolism Device Location: BLE  Intervention: Prevent Infection  Recent Flowsheet Documentation  Taken 03/19/2024 0800 by Linda Hedges, RN  Infection Prevention:   cohorting utilized   environmental surveillance performed   equipment surfaces disinfected   hand hygiene promoted   rest/sleep promoted   single patient room provided  Goal: Optimal Comfort and Wellbeing  Outcome: Progressing  Goal: Readiness for Transition of Care  Outcome: Progressing  Goal: Rounds/Family Conference  Outcome: Progressing     Problem: Fall Injury Risk  Goal: Absence of Fall and Fall-Related Injury  Outcome: Progressing  Intervention: Promote Injury-Free Environment  Recent Flowsheet Documentation  Taken 03/19/2024 0800 by Linda Hedges, RN  Safety Interventions:   bed alarm   fall reduction program maintained   lighting adjusted for tasks/safety   low bed   nonskid shoes/slippers when out of bed   toileting scheduled   supervised activity     Problem: Wound  Goal: Optimal Coping  Outcome: Progressing  Goal: Optimal Functional  Ability  Outcome: Progressing  Intervention: Optimize Functional Ability  Recent Flowsheet Documentation  Taken 03/19/2024 0800 by Linda Hedges, RN  Activity Management: bedrest  Goal: Absence of Infection Signs and Symptoms  Outcome: Progressing  Intervention: Prevent or Manage Infection  Recent Flowsheet Documentation  Taken 03/19/2024 0800 by Linda Hedges, RN  Infection Management: aseptic technique maintained  Isolation Precautions: contact precautions maintained  Goal: Improved Oral Intake  Outcome: Progressing  Goal: Optimal Pain Control and Function  Outcome: Progressing  Goal: Skin Health and Integrity  Outcome: Progressing  Intervention: Optimize Skin Protection  Recent Flowsheet Documentation  Taken 03/19/2024 1615 by Linda Hedges, RN  Head of Bed [facility]) Positioning: HOB at 30 degrees  Taken 03/19/2024 0800 by Linda Hedges, RN  Activity Management: bedrest  Pressure Reduction Techniques:   frequent weight shift encouraged   heels elevated off bed   positioned off wounds   pressure points protected   weight shift assistance provided  Pressure Reduction Devices: pressure-redistributing mattress utilized  Skin Protection:   adhesive use limited   skin-to-device areas padded   skin-to-skin areas padded   transparent dressing maintained   tubing/devices free from skin contact  Goal: Optimal Wound Healing  Outcome: Progressing     Problem: Skin Injury Risk Increased  Goal: Skin Health and Integrity  Outcome: Progressing  Intervention: Optimize Skin Protection  Recent Flowsheet Documentation  Taken 03/19/2024 1615 by Linda Hedges, RN  Head of Bed [facility]) Positioning: HOB at 30 degrees  Taken 03/19/2024 0800 by Linda Hedges, RN  Activity Management: bedrest  Pressure Reduction Techniques:   frequent weight shift encouraged   heels elevated off bed   positioned off wounds   pressure points protected   weight shift assistance provided  Pressure Reduction Devices: pressure-redistributing mattress utilized  Skin Protection:   adhesive use limited   skin-to-device areas padded   skin-to-skin areas padded   transparent dressing maintained   tubing/devices free from skin contact     Problem: Non-Violent Restraints  Intervention: Utilize least restrictive measures  Recent Flowsheet Documentation  Taken 03/19/2024 1600 by Linda Hedges, RN  Less Restrictive Alternative:   Repositioning   Comfort Measures  Taken 03/19/2024 1430 by Linda Hedges, RN  Less Restrictive Alternative:   Comfort Measures   Repositioning   Re-evaluate equipment/device protection   Family/visitor at bedside  Intervention: Patient Monitoring  Recent Flowsheet Documentation  Taken 03/19/2024 1600 by Linda Hedges, RN  Psychological Status/Visual Check: Asleep  Circulation/Skin Integrity: No signs of injury  Range of Motion: Performed  Fluids: NPO  Food/Meal: Enteral feeding/TPN  Elimination: Urinary catheter  Taken 03/19/2024 1430 by Linda Hedges, RN  Psychological Status/Visual Check: Asleep  Circulation/Skin Integrity: No signs of injury  Range of Motion: Performed  Fluids: NPO  Food/Meal: Enteral feeding/TPN  Elimination: Urinary catheter  Intervention: Patient Education  Recent Flowsheet Documentation  Taken 03/19/2024 1430 by Linda Hedges, RN  Criteria Explained: Yes  Patient's Response: Patient Unable to respond  Family Notification: Other     Problem: Mechanical Ventilation Invasive  Goal: Optimal Device Function  Intervention: Optimize Device Care and Function  Recent Flowsheet Documentation  Taken 03/19/2024 1615 by Linda Hedges, RN  Oral Care: lip/mouth moisturizer applied  Taken 03/19/2024 0800 by Linda Hedges, RN  Oral Care:   lip/mouth moisturizer applied   suction provided  Goal: Absence of Device-Related Skin and Tissue Injury  Intervention: Maintain Skin and Tissue Health  Recent  Flowsheet Documentation  Taken 03/19/2024 0800 by Linda Hedges, RN  Device Skin Pressure Protection:   absorbent pad utilized/changed   adhesive use limited   positioning supports utilized   pressure points protected   skin-to-device areas padded   skin-to-skin areas padded  Goal: Absence of Ventilator-Induced Lung Injury  Intervention: Prevent Ventilator-Associated Pneumonia  Recent Flowsheet Documentation  Taken 03/19/2024 1615 by Linda Hedges, RN  Head of Bed [facility]) Positioning: HOB at 30 degrees  Oral Care: lip/mouth moisturizer applied  Taken 03/19/2024 0800 by Linda Hedges, RN  Oral Care:   lip/mouth moisturizer applied   suction provided     Problem: Mechanical Ventilation Invasive  Goal: Effective Communication  Outcome: Progressing  Goal: Optimal Device Function  Outcome: Progressing  Intervention: Optimize Device Care and Function  Recent Flowsheet Documentation  Taken 03/19/2024 1615 by Linda Hedges, RN  Oral Care: lip/mouth moisturizer applied  Taken 03/19/2024 0800 by Linda Hedges, RN  Oral Care:   lip/mouth moisturizer applied   suction provided  Goal: Mechanical Ventilation Liberation  Outcome: Progressing  Goal: Optimal Nutrition Delivery  Outcome: Progressing  Goal: Absence of Device-Related Skin and Tissue Injury  Outcome: Progressing  Intervention: Maintain Skin and Tissue Health  Recent Flowsheet Documentation  Taken 03/19/2024 0800 by Linda Hedges, RN  Device Skin Pressure Protection:   absorbent pad utilized/changed   adhesive use limited   positioning supports utilized   pressure points protected   skin-to-device areas padded   skin-to-skin areas padded  Goal: Absence of Ventilator-Induced Lung Injury  Outcome: Progressing  Intervention: Prevent Ventilator-Associated Pneumonia  Recent Flowsheet Documentation  Taken 03/19/2024 1615 by Linda Hedges, RN  Head of Bed [facility]) Positioning: HOB at 30 degrees  Oral Care: lip/mouth moisturizer applied  Taken 03/19/2024 0800 by Linda Hedges, RN  Oral Care:   lip/mouth moisturizer applied   suction provided     Problem: Artificial Airway  Goal: Effective Communication  Outcome: Progressing  Goal: Optimal Device Function  Outcome: Progressing  Intervention: Optimize Device Care and Function  Recent Flowsheet Documentation  Taken 03/19/2024 1615 by Linda Hedges, RN  Oral Care: lip/mouth moisturizer applied  Taken 03/19/2024 0800 by Linda Hedges, RN  Aspiration Precautions:   awake/alert before oral intake   distractions minimized during oral intake   oral hygiene care promoted   respiratory status monitored   upright posture maintained  Oral Care:   lip/mouth moisturizer applied   suction provided  Goal: Absence of Device-Related Skin or Tissue Injury  Outcome: Progressing  Intervention: Maintain Skin and Tissue Health  Recent Flowsheet Documentation  Taken 03/19/2024 0800 by Linda Hedges, RN  Device Skin Pressure Protection:   absorbent pad utilized/changed   adhesive use limited   positioning supports utilized   pressure points protected   skin-to-device areas padded   skin-to-skin areas padded     Problem: Malnutrition  Goal: Improved Nutritional Intake  Outcome: Progressing

## 2024-03-19 NOTE — Unmapped (Signed)
 Problem: Adult Inpatient Plan of Care  Goal: Plan of Care Review  Outcome: Progressing  Goal: Patient-Specific Goal (Individualized)  Outcome: Progressing  Goal: Absence of Hospital-Acquired Illness or Injury  Outcome: Progressing  Intervention: Identify and Manage Fall Risk  Recent Flowsheet Documentation  Taken 03/19/2024 0200 by Erroll Luna, RN  Safety Interventions:   aspiration precautions   bed alarm  Taken 03/19/2024 0000 by Erroll Luna, RN  Safety Interventions:   aspiration precautions   bed alarm  Taken 03/18/2024 2200 by Erroll Luna, RN  Safety Interventions:   aspiration precautions   bed alarm  Taken 03/18/2024 2000 by Erroll Luna, RN  Safety Interventions:   aspiration precautions   bed alarm  Intervention: Prevent Skin Injury  Recent Flowsheet Documentation  Taken 03/19/2024 0200 by Erroll Luna, RN  Positioning for Skin: Left  Device Skin Pressure Protection: absorbent pad utilized/changed  Skin Protection: silicone foam dressing in place  Taken 03/19/2024 0000 by Erroll Luna, RN  Positioning for Skin: Right  Device Skin Pressure Protection: absorbent pad utilized/changed  Skin Protection: silicone foam dressing in place  Taken 03/18/2024 2200 by Erroll Luna, RN  Positioning for Skin: Left  Device Skin Pressure Protection: absorbent pad utilized/changed  Skin Protection: silicone foam dressing in place  Taken 03/18/2024 2000 by Erroll Luna, RN  Positioning for Skin: Right  Device Skin Pressure Protection: absorbent pad utilized/changed  Skin Protection: silicone foam dressing in place  Intervention: Prevent and Manage VTE (Venous Thromboembolism) Risk  Recent Flowsheet Documentation  Taken 03/19/2024 0200 by Erroll Luna, RN  Anti-Embolism Device Type: SCD, Knee  Anti-Embolism Device Status: On  Anti-Embolism Device Location: BLE  Taken 03/19/2024 0000 by Erroll Luna, RN  Anti-Embolism Device Type: SCD, Knee  Anti-Embolism Device Status: On  Anti-Embolism Device Location: BLE  Taken 03/18/2024 2200 by Erroll Luna, RN  Anti-Embolism Device Type: SCD, Knee  Anti-Embolism Device Status: On  Anti-Embolism Device Location: BLE  Taken 03/18/2024 2000 by Erroll Luna, RN  Anti-Embolism Device Type: SCD, Knee  Anti-Embolism Device Status: On  Anti-Embolism Device Location: BLE  Goal: Optimal Comfort and Wellbeing  Outcome: Progressing  Goal: Readiness for Transition of Care  Outcome: Progressing  Goal: Rounds/Family Conference  Outcome: Progressing

## 2024-03-20 LAB — IRON PANEL
IRON SATURATION: 5 % — ABNORMAL LOW (ref 20–55)
IRON: 13 ug/dL — ABNORMAL LOW (ref 50–170)
TOTAL IRON BINDING CAPACITY: 249 ug/dL — ABNORMAL LOW (ref 250–425)

## 2024-03-20 LAB — BASIC METABOLIC PANEL
ANION GAP: 11 mmol/L (ref 5–14)
ANION GAP: 11 mmol/L (ref 5–14)
BLOOD UREA NITROGEN: 64 mg/dL — ABNORMAL HIGH (ref 9–23)
BLOOD UREA NITROGEN: 65 mg/dL — ABNORMAL HIGH (ref 9–23)
BUN / CREAT RATIO: 54
BUN / CREAT RATIO: 56
CALCIUM: 9.4 mg/dL (ref 8.7–10.4)
CALCIUM: 9.1 mg/dL (ref 8.7–10.4)
CHLORIDE: 110 mmol/L — ABNORMAL HIGH (ref 98–107)
CHLORIDE: 107 mmol/L (ref 98–107)
CO2: 27 mmol/L (ref 20.0–31.0)
CO2: 29 mmol/L (ref 20.0–31.0)
CREATININE: 1.19 mg/dL — ABNORMAL HIGH (ref 0.55–1.02)
CREATININE: 1.16 mg/dL — ABNORMAL HIGH (ref 0.55–1.02)
EGFR CKD-EPI (2021) FEMALE: 46 mL/min/{1.73_m2} — ABNORMAL LOW (ref >=60)
EGFR CKD-EPI (2021) FEMALE: 47 mL/min/{1.73_m2} — ABNORMAL LOW (ref >=60)
GLUCOSE RANDOM: 218 mg/dL — ABNORMAL HIGH (ref 70–179)
GLUCOSE RANDOM: 210 mg/dL — ABNORMAL HIGH (ref 70–99)
POTASSIUM: 3.9 mmol/L (ref 3.4–4.8)
POTASSIUM: 4.2 mmol/L (ref 3.4–4.8)
SODIUM: 148 mmol/L — ABNORMAL HIGH (ref 135–145)
SODIUM: 147 mmol/L — ABNORMAL HIGH (ref 135–145)

## 2024-03-20 LAB — C-REACTIVE PROTEIN: C-REACTIVE PROTEIN: 18.5 mg/L — ABNORMAL HIGH (ref <=10.0)

## 2024-03-20 LAB — CBC
HEMATOCRIT: 23.3 % — ABNORMAL LOW (ref 34.0–44.0)
HEMOGLOBIN: 7.4 g/dL — ABNORMAL LOW (ref 11.3–14.9)
MEAN CORPUSCULAR HEMOGLOBIN CONC: 31.8 g/dL — ABNORMAL LOW (ref 32.0–36.0)
MEAN CORPUSCULAR HEMOGLOBIN: 27.1 pg (ref 25.9–32.4)
MEAN CORPUSCULAR VOLUME: 85.3 fL (ref 77.6–95.7)
MEAN PLATELET VOLUME: 9.9 fL (ref 6.8–10.7)
PLATELET COUNT: 281 10*9/L (ref 150–450)
RED BLOOD CELL COUNT: 2.74 10*12/L — ABNORMAL LOW (ref 3.95–5.13)
RED CELL DISTRIBUTION WIDTH: 18.2 % — ABNORMAL HIGH (ref 12.2–15.2)
WBC ADJUSTED: 11.8 10*9/L — ABNORMAL HIGH (ref 3.6–11.2)

## 2024-03-20 LAB — PATIENT NEEDLESTICK PACKAGE
HCV RNA: NOT DETECTED
HEPATITIS B SURFACE ANTIGEN: NONREACTIVE
HEPATITIS C ANTIBODY: NONREACTIVE
HIV ANTIGEN/ANTIBODY COMBO: NONREACTIVE

## 2024-03-20 LAB — APTT
APTT: 69.4 s — ABNORMAL HIGH (ref 24.8–38.4)
HEPARIN CORRELATION: 0.4

## 2024-03-20 LAB — PHOSPHORUS
PHOSPHORUS: 4.7 mg/dL (ref 2.4–5.1)
PHOSPHORUS: 5.4 mg/dL — ABNORMAL HIGH (ref 2.4–5.1)

## 2024-03-20 LAB — MAGNESIUM
MAGNESIUM: 1.8 mg/dL (ref 1.6–2.6)
MAGNESIUM: 7 mg/dL (ref 1.6–2.6)
MAGNESIUM: 2.4 mg/dL (ref 1.6–2.6)

## 2024-03-20 LAB — CYSTATIN C
CYSTATIN C: 2.72 mg/L — ABNORMAL HIGH (ref 0.64–1.23)
EGFR CKD-EPI (2012) CYSTATIN C FEMALE: 18 mL/min/{1.73_m2} — ABNORMAL LOW (ref >=60)

## 2024-03-20 LAB — FERRITIN: FERRITIN: 105.6 ng/mL (ref 7.3–270.7)

## 2024-03-20 MED ADMIN — carboxymethylcellulose sodium (THERATEARS) 0.25 % ophthalmic solution 2 drop: 2 [drp] | OPHTHALMIC

## 2024-03-20 MED ADMIN — sodium chloride 3 % NEBULIZER solution 4 mL: 4 mL | RESPIRATORY_TRACT | @ 01:00:00

## 2024-03-20 MED ADMIN — amiodarone (PACERONE) tablet 200 mg: 200 mg | GASTROENTERAL | @ 13:00:00

## 2024-03-20 MED ADMIN — aspirin chewable tablet 81 mg: 81 mg | GASTROENTERAL | @ 13:00:00

## 2024-03-20 MED ADMIN — acetaminophen (TYLENOL) tablet 1,000 mg: 1000 mg | GASTROENTERAL | @ 04:00:00 | Stop: 2024-03-20

## 2024-03-20 MED ADMIN — fat emulsion 20 % with fish oil (SMOFlipid) infusion 250 mL: 250 mL | INTRAVENOUS | @ 02:00:00 | Stop: 2024-03-20

## 2024-03-20 MED ADMIN — sodium chloride (NS) 0.9 % flush 10 mL: 10 mL | INTRAVENOUS | @ 02:00:00

## 2024-03-20 MED ADMIN — esomeprazole (NEXIUM) granules 40 mg: 40 mg | GASTROENTERAL | @ 15:00:00

## 2024-03-20 MED ADMIN — ceftolozane-tazobactam (ZERBAXA) 750 mg in sodium chloride (NS) 0.9 % 100 mL IVPB: 750 mg | INTRAVENOUS | @ 04:00:00 | Stop: 2024-03-20

## 2024-03-20 MED ADMIN — carboxymethylcellulose sodium (THERATEARS) 0.25 % ophthalmic solution 2 drop: 2 [drp] | OPHTHALMIC | @ 18:00:00

## 2024-03-20 MED ADMIN — heparin 25,000 Units/250 mL (100 units/mL) in 0.45% saline infusion (premade): 0-24 [IU]/kg/h | INTRAVENOUS | @ 07:00:00

## 2024-03-20 MED ADMIN — sodium chloride (NS) 0.9 % flush 10 mL: 10 mL | INTRAVENOUS | @ 10:00:00

## 2024-03-20 MED ADMIN — propofol (DIPRIVAN) infusion 10 mg/mL: 0-40 ug/kg/min | INTRAVENOUS | @ 15:00:00

## 2024-03-20 MED ADMIN — magnesium sulfate in water 2 gram/50 mL (4 %) IVPB 2 g: 2 g | INTRAVENOUS | @ 17:00:00

## 2024-03-20 MED ADMIN — insulin NPH (HumuLIN,NovoLIN) injection 10 Units: 10 [IU] | SUBCUTANEOUS | @ 13:00:00 | Stop: 2024-03-20

## 2024-03-20 MED ADMIN — sodium ferric gluconate (FERRLECIT) 125 mg in sodium chloride (NS) 0.9 % 100 mL IVPB: 125 mg | INTRAVENOUS | @ 15:00:00 | Stop: 2024-03-22

## 2024-03-20 MED ADMIN — acetaminophen (TYLENOL) tablet 1,000 mg: 1000 mg | GASTROENTERAL | @ 18:00:00

## 2024-03-20 MED ADMIN — insulin regular (HumuLIN,NovoLIN) injection 0-20 Units: 0-20 [IU] | SUBCUTANEOUS | @ 16:00:00

## 2024-03-20 MED ADMIN — potassium chloride 20 mEq in 100 mL IVPB Premix: 20 meq | INTRAVENOUS | @ 16:00:00 | Stop: 2025-03-19

## 2024-03-20 MED ADMIN — ceftolozane-tazobactam (ZERBAXA) 750 mg in sodium chloride (NS) 0.9 % 100 mL IVPB: 750 mg | INTRAVENOUS | @ 13:00:00 | Stop: 2024-03-20

## 2024-03-20 MED ADMIN — insulin regular (HumuLIN,NovoLIN) injection 0-20 Units: 0-20 [IU] | SUBCUTANEOUS | @ 09:00:00

## 2024-03-20 MED ADMIN — sodium chloride 3 % NEBULIZER solution 4 mL: 4 mL | RESPIRATORY_TRACT | @ 15:00:00

## 2024-03-20 MED ADMIN — insulin NPH (HumuLIN,NovoLIN) injection 15 Units: 15 [IU] | SUBCUTANEOUS | @ 22:00:00

## 2024-03-20 MED ADMIN — sodium chloride (NS) 0.9 % flush 10 mL: 10 mL | INTRAVENOUS | @ 17:00:00

## 2024-03-20 MED ADMIN — insulin regular (HumuLIN,NovoLIN) injection 0-20 Units: 0-20 [IU] | SUBCUTANEOUS | @ 04:00:00

## 2024-03-20 MED ADMIN — insulin regular (HumuLIN,NovoLIN) injection 0-20 Units: 0-20 [IU] | SUBCUTANEOUS | @ 22:00:00

## 2024-03-20 MED ADMIN — sodium chloride 3 % NEBULIZER solution 4 mL: 4 mL | RESPIRATORY_TRACT | @ 08:00:00

## 2024-03-20 MED ADMIN — ipratropium (ATROVENT) 0.02 % nebulizer solution 500 mcg: 500 ug | RESPIRATORY_TRACT | @ 08:00:00

## 2024-03-20 MED ADMIN — furosemide (LASIX) injection 80 mg: 80 mg | INTRAVENOUS | @ 07:00:00 | Stop: 2024-03-20

## 2024-03-20 MED ADMIN — acetaminophen (TYLENOL) tablet 1,000 mg: 1000 mg | GASTROENTERAL | @ 13:00:00 | Stop: 2024-03-20

## 2024-03-20 MED ADMIN — carboxymethylcellulose sodium (THERATEARS) 0.25 % ophthalmic solution 2 drop: 2 [drp] | OPHTHALMIC | @ 13:00:00

## 2024-03-20 MED ADMIN — arformoterol (BROVANA) nebulizer solution 15 mcg/2 mL: 15 ug | RESPIRATORY_TRACT | @ 01:00:00

## 2024-03-20 MED ADMIN — polyethylene glycol (MIRALAX) packet 17 g: 17 g | GASTROENTERAL | @ 13:00:00 | Stop: 2024-03-20

## 2024-03-20 MED ADMIN — sodium chloride 3 % NEBULIZER solution 4 mL: 4 mL | RESPIRATORY_TRACT | @ 19:00:00

## 2024-03-20 MED ADMIN — pantoprazole (Protonix) injection 40 mg: 40 mg | INTRAVENOUS | @ 13:00:00 | Stop: 2024-03-20

## 2024-03-20 MED ADMIN — ceftolozane-tazobactam (ZERBAXA) 750 mg in sodium chloride (NS) 0.9 % 100 mL IVPB: 750 mg | INTRAVENOUS | @ 21:00:00 | Stop: 2024-03-20

## 2024-03-20 MED ADMIN — melatonin tablet 3 mg: 3 mg | GASTROENTERAL

## 2024-03-20 MED ADMIN — ipratropium (ATROVENT) 0.02 % nebulizer solution 500 mcg: 500 ug | RESPIRATORY_TRACT | @ 15:00:00

## 2024-03-20 MED ADMIN — sennosides (SENOKOT) oral syrup: 2.5 mL | GASTROENTERAL

## 2024-03-20 MED ADMIN — ipratropium (ATROVENT) 0.02 % nebulizer solution 500 mcg: 500 ug | RESPIRATORY_TRACT | @ 19:00:00

## 2024-03-20 MED ADMIN — Parenteral Nutrition (CENTRAL): INTRAVENOUS | @ 02:00:00 | Stop: 2024-03-20

## 2024-03-20 MED ADMIN — furosemide (LASIX) injection 80 mg: 80 mg | INTRAVENOUS | @ 14:00:00 | Stop: 2024-03-20

## 2024-03-20 MED ADMIN — atorvastatin (LIPITOR) tablet 40 mg: 40 mg | GASTROENTERAL | @ 13:00:00

## 2024-03-20 MED ADMIN — ipratropium (ATROVENT) 0.02 % nebulizer solution 500 mcg: 500 ug | RESPIRATORY_TRACT | @ 01:00:00

## 2024-03-20 MED ADMIN — arformoterol (BROVANA) nebulizer solution 15 mcg/2 mL: 15 ug | RESPIRATORY_TRACT | @ 13:00:00

## 2024-03-20 NOTE — Unmapped (Signed)
 Copied from CRM #[id number]. Topic: Referral - Referral Status  >> Mar 20, 2024  1:05 PM Lequita Halt wrote:  Lamar Laundry with [facility] is calling regarding: Order to reschedule pt's eval sent it on 02/04/2024. Lamar Laundry states that the provider's signature is needed. The document is in the patient's chart under the media tab and date/time is stamped 02/03/23 and the document type is other patient.  Fax [phone]

## 2024-03-20 NOTE — Unmapped (Signed)
 Copied from CRM #[id number]. Topic: Access To Clinicians - Req Clinic Call Back  >> Mar 20, 2024 12:53 PM Leamon Arnt wrote:  [facility] is calling in regards to some forms they had faxed over on 3/10 in regards to this patient about office noted adenem. I looked through the patients chart and do not see any documentation or anything being uploaded from them . They also confirmed our fax number and it is accuarate.     Call back # [phone]     And they stated they will re fax those forms

## 2024-03-20 NOTE — Unmapped (Signed)
 Problem: Adult Inpatient Plan of Care  Goal: Plan of Care Review  Outcome: Progressing  Goal: Patient-Specific Goal (Individualized)  Outcome: Progressing  Goal: Absence of Hospital-Acquired Illness or Injury  Outcome: Progressing  Intervention: Identify and Manage Fall Risk  Recent Flowsheet Documentation  Taken 03/19/2024 2000 by Kara Dies, RN  Safety Interventions:   bed alarm   fall reduction program maintained   lighting adjusted for tasks/safety   low bed   nonskid shoes/slippers when out of bed   toileting scheduled   supervised activity  Intervention: Prevent Skin Injury  Recent Flowsheet Documentation  Taken 03/20/2024 0400 by Kara Dies, RN  Positioning for Skin: Right  Taken 03/20/2024 0200 by Kara Dies, RN  Positioning for Skin: Left  Taken 03/20/2024 0000 by Kara Dies, RN  Positioning for Skin: Right  Taken 03/19/2024 2200 by Kara Dies, RN  Positioning for Skin: Left  Taken 03/19/2024 2000 by Kara Dies, RN  Positioning for Skin: Right  Device Skin Pressure Protection:   absorbent pad utilized/changed   tubing/devices free from skin contact  Skin Protection:   adhesive use limited   cleansing with dimethicone incontinence wipes  Intervention: Prevent and Manage VTE (Venous Thromboembolism) Risk  Recent Flowsheet Documentation  Taken 03/20/2024 0400 by Kara Dies, RN  Anti-Embolism Device Type: SCD, Knee  Anti-Embolism Device Status: On  Anti-Embolism Device Location: BLE  Taken 03/20/2024 0200 by Kara Dies, RN  Anti-Embolism Device Type: SCD, Knee  Anti-Embolism Device Status: On  Anti-Embolism Device Location: BLE  Taken 03/20/2024 0000 by Kara Dies, RN  Anti-Embolism Device Type: SCD, Knee  Anti-Embolism Device Status: On  Anti-Embolism Device Location: BLE  Taken 03/19/2024 2200 by Kara Dies, RN  Anti-Embolism Device Type: SCD, Knee  Anti-Embolism Device Status: On  Anti-Embolism Device Location: BLE  Taken 03/19/2024 2000 by Kara Dies, RN  Anti-Embolism Device Type: SCD, Knee  Anti-Embolism Device Status: On  Anti-Embolism Device Location: BLE  Intervention: Prevent Infection  Recent Flowsheet Documentation  Taken 03/19/2024 2000 by Kara Dies, RN  Infection Prevention:   cohorting utilized   environmental surveillance performed   equipment surfaces disinfected   hand hygiene promoted   rest/sleep promoted   single patient room provided  Goal: Optimal Comfort and Wellbeing  Outcome: Progressing  Goal: Readiness for Transition of Care  Outcome: Progressing  Goal: Rounds/Family Conference  Outcome: Progressing     Problem: Infection  Goal: Absence of Infection Signs and Symptoms  Outcome: Progressing  Intervention: Prevent or Manage Infection  Recent Flowsheet Documentation  Taken 03/19/2024 2000 by Kara Dies, RN  Infection Management: aseptic technique maintained  Isolation Precautions: contact precautions maintained     Problem: Fall Injury Risk  Goal: Absence of Fall and Fall-Related Injury  Outcome: Progressing  Intervention: Identify and Manage Contributors  Recent Flowsheet Documentation  Taken 03/19/2024 2000 by Kara Dies, RN  Self-Care Promotion:   independence encouraged   BADL personal objects within reach   BADL personal routines maintained  Intervention: Promote Injury-Free Environment  Recent Flowsheet Documentation  Taken 03/19/2024 2000 by Kara Dies, RN  Safety Interventions:   bed alarm   fall reduction program maintained   lighting adjusted for tasks/safety   low bed   nonskid shoes/slippers when out of bed   toileting scheduled   supervised activity     Problem: Wound  Goal: Optimal Coping  Outcome: Progressing  Goal: Optimal Functional Ability  Outcome: Progressing  Intervention: Optimize Functional Ability  Recent Flowsheet Documentation  Taken 03/19/2024 2000 by  Kara Dies, RN  Activity Management: bedrest  Goal: Absence of Infection Signs and Symptoms  Outcome: Progressing  Intervention: Prevent or Manage Infection  Recent Flowsheet Documentation  Taken 03/19/2024 2000 by Kara Dies, RN  Infection Management: aseptic technique maintained  Isolation Precautions: contact precautions maintained  Goal: Improved Oral Intake  Outcome: Progressing  Goal: Optimal Pain Control and Function  Outcome: Progressing  Intervention: Prevent or Manage Pain  Recent Flowsheet Documentation  Taken 03/19/2024 2000 by Kara Dies, RN  Sleep/Rest Enhancement:   awakenings minimized   consistent schedule promoted   family presence promoted   music provided   noise level reduced   natural light exposure provided   reading promoted   regular sleep/rest pattern promoted   relaxation techniques promoted   room darkened   therapeutic touch utilized  Goal: Skin Health and Integrity  Outcome: Progressing  Intervention: Optimize Skin Protection  Recent Flowsheet Documentation  Taken 03/20/2024 0000 by Kara Dies, RN  Head of Bed [facility]) Positioning: HOB at 30-45 degrees  Taken 03/19/2024 2000 by Kara Dies, RN  Activity Management: bedrest  Pressure Reduction Techniques:   frequent weight shift encouraged   heels elevated off bed  Pressure Reduction Devices: specialty bed utilized  Skin Protection:   adhesive use limited   cleansing with dimethicone incontinence wipes  Goal: Optimal Wound Healing  Outcome: Progressing  Intervention: Promote Wound Healing  Recent Flowsheet Documentation  Taken 03/19/2024 2000 by Kara Dies, RN  Sleep/Rest Enhancement:   awakenings minimized   consistent schedule promoted   family presence promoted   music provided   noise level reduced   natural light exposure provided   reading promoted   regular sleep/rest pattern promoted   relaxation techniques promoted   room darkened   therapeutic touch utilized     Problem: Skin Injury Risk Increased  Goal: Skin Health and Integrity  Outcome: Progressing  Intervention: Optimize Skin Protection  Recent Flowsheet Documentation  Taken 03/20/2024 0000 by Kara Dies, RN  Head of Bed [facility]) Positioning: HOB at 30-45 degrees  Taken 03/19/2024 2000 by Kara Dies, RN  Activity Management: bedrest  Pressure Reduction Techniques:   frequent weight shift encouraged   heels elevated off bed  Pressure Reduction Devices: specialty bed utilized  Skin Protection:   adhesive use limited   cleansing with dimethicone incontinence wipes     Problem: Self-Care Deficit  Goal: Improved Ability to Complete Activities of Daily Living  Outcome: Progressing  Intervention: Promote Activity and Functional Independence  Recent Flowsheet Documentation  Taken 03/19/2024 2000 by Kara Dies, RN  Self-Care Promotion:   independence encouraged   BADL personal objects within reach   BADL personal routines maintained     Problem: Artificial Airway  Goal: Effective Communication  Outcome: Progressing  Goal: Optimal Device Function  Outcome: Progressing  Intervention: Optimize Device Care and Function  Recent Flowsheet Documentation  Taken 03/20/2024 0200 by Kara Dies, RN  Oral Care:   mouth swabbed   oral rinse provided   tongue brushed   teeth brushed   suction provided   lip/mouth moisturizer applied  Taken 03/20/2024 0000 by Kara Dies, RN  Oral Care:   lip/mouth moisturizer applied   mouth swabbed   oral rinse provided   tongue brushed   teeth brushed   suction provided  Taken 03/19/2024 2200 by Kara Dies, RN  Oral Care:   lip/mouth moisturizer applied   mouth swabbed   oral rinse provided   suction provided   teeth  brushed   tongue brushed  Taken 03/19/2024 2000 by Kara Dies, RN  Airway/Ventilation Management:   airway patency maintained   calming measures promoted   humidification applied   pulmonary hygiene promoted  Aspiration Precautions:   awake/alert before oral intake   distractions minimized during oral intake   oral hygiene care promoted   respiratory status monitored   upright posture maintained  Oral Care:   lip/mouth moisturizer applied   suction provided  Airway Safety Measures:   mask valve resuscitator at bedside   manual resuscitator/mask at bedside   oxygen flowmeter at bedside   suction at bedside  Goal: Absence of Device-Related Skin or Tissue Injury  Outcome: Progressing  Intervention: Maintain Skin and Tissue Health  Recent Flowsheet Documentation  Taken 03/19/2024 2000 by Kara Dies, RN  Device Skin Pressure Protection:   absorbent pad utilized/changed   tubing/devices free from skin contact     Problem: Malnutrition  Goal: Improved Nutritional Intake  Outcome: Progressing     Problem: Mechanical Ventilation Invasive  Goal: Effective Communication  Outcome: Progressing  Goal: Optimal Device Function  Outcome: Progressing  Intervention: Optimize Device Care and Function  Recent Flowsheet Documentation  Taken 03/20/2024 0200 by Kara Dies, RN  Oral Care:   mouth swabbed   oral rinse provided   tongue brushed   teeth brushed   suction provided   lip/mouth moisturizer applied  Taken 03/20/2024 0000 by Kara Dies, RN  Oral Care:   lip/mouth moisturizer applied   mouth swabbed   oral rinse provided   tongue brushed   teeth brushed   suction provided  Taken 03/19/2024 2200 by Kara Dies, RN  Oral Care:   lip/mouth moisturizer applied   mouth swabbed   oral rinse provided   suction provided   teeth brushed   tongue brushed  Taken 03/19/2024 2000 by Kara Dies, RN  Airway/Ventilation Management:   airway patency maintained   calming measures promoted   humidification applied   pulmonary hygiene promoted  Oral Care:   lip/mouth moisturizer applied   suction provided  Airway Safety Measures:   mask valve resuscitator at bedside   manual resuscitator/mask at bedside   oxygen flowmeter at bedside   suction at bedside  Goal: Mechanical Ventilation Liberation  Outcome: Progressing  Intervention: Promote Extubation and Mechanical Ventilation Liberation  Recent Flowsheet Documentation  Taken 03/19/2024 2000 by Kara Dies, RN  Sleep/Rest Enhancement:   awakenings minimized   consistent schedule promoted   family presence promoted   music provided   noise level reduced   natural light exposure provided   reading promoted   regular sleep/rest pattern promoted   relaxation techniques promoted   room darkened   therapeutic touch utilized  Goal: Optimal Nutrition Delivery  Outcome: Progressing  Goal: Absence of Device-Related Skin and Tissue Injury  Outcome: Progressing  Intervention: Maintain Skin and Tissue Health  Recent Flowsheet Documentation  Taken 03/19/2024 2000 by Kara Dies, RN  Device Skin Pressure Protection:   absorbent pad utilized/changed   tubing/devices free from skin contact  Goal: Absence of Ventilator-Induced Lung Injury  Outcome: Progressing  Intervention: Prevent Ventilator-Associated Pneumonia  Recent Flowsheet Documentation  Taken 03/20/2024 0200 by Kara Dies, RN  Oral Care:   mouth swabbed   oral rinse provided   tongue brushed   teeth brushed   suction provided   lip/mouth moisturizer applied  Taken 03/20/2024 0000 by Kara Dies, RN  Head of Bed [facility]) Positioning: HOB at 30-45 degrees  Oral  Care:   lip/mouth moisturizer applied   mouth swabbed   oral rinse provided   tongue brushed   teeth brushed   suction provided  Taken 03/19/2024 2200 by Kara Dies, RN  Oral Care:   lip/mouth moisturizer applied   mouth swabbed   oral rinse provided   suction provided   teeth brushed   tongue brushed  Taken 03/19/2024 2000 by Kara Dies, RN  Oral Care:   lip/mouth moisturizer applied   suction provided

## 2024-03-20 NOTE — Unmapped (Signed)
 Adult Nutrition Progress Note      Visit Type: Follow-Up  Reason for Visit: Enteral Nutrition, Parenteral Nutrition    Plan:   Continue PN  Increase dextrose from 150g to 200g  Check micronutrients: iron panel, ferritin, CRP, zinc, copper, selenium, vitamin D  DC held TF order    Interval Events:  Remains NPO and on PN.   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 This hernia has been present for many years dating back and largely unchanged from CT abdomen/pelvis in 2012 and markedly unchanged on CT from 3/14 from the CT obtained on initial presentation. Though the hernia does make her an increased aspiration risk, it is not the cause of her pulmonary decompensation.strict aspiration precautions, continuing NGT decompression to avoid further aspiration ,? J tube    Daily Estimated Nutrient Needs:  Energy: 1525  1830 kcals 25-30 kcal/kg using estimated dry weight, 61 kg (03/02/24 1026)]  Protein: 90-120 gm [1.5-2.0 gm/kg using estimated dry weight, 61 kg (03/02/24 1026)]    PN start date: restart 3/15 via non-tunnelled CVC  Will continue to follow daily while on PN with full assessments on Mondays    Ar[place] Jansky MS, RD, LDN, CNSC

## 2024-03-20 NOTE — Unmapped (Addendum)
 Adult Nutrition Progress Note      Visit Type: Follow-Up  Reason for Visit: Enteral Nutrition, Parenteral Nutrition    Plan:   Recommend j tube placement as soon as possible for optimal feeding access. Lack of appropriate enteral access is the only reason pt needs PN at this time  Until then, continue PN but remove lipids given propofol and likely plan to start TFs  If to restart gastric feeds and/or when jejunal access obtained:  For tube feeds, Recommend Vital AF 1.2 Cal start at 74ml/hr and advance by 10-16ml q4hrs to goal rate 60 mL/hr. This provides 1512 kcals, 95 g protein, 140 g carbohydrate, 68 g fat, 7 g fiber, 1021 mL free water, and meets 101% USRDI.  FWF 30-105ml q1hrs  Let PN expire when pt consistently tolerating tube feeds at 37ml/hr   Consider reglan with gastric feeds to minimize aspiration risk  IV Iron repletion for deficiency    Interval Events:  Intubated 3/20 for bronch and remains intubated. On low dose propofol.   Was on the schedule for j tube placement today with GI however may be delayed pending further conversations/decisions with family.   Plan for abd imaging and if unremarkable start trickle TFs and free water flushes for hydration via NGT with gradual advancement towards goal     Micronutrient labs starting to result:     Latest Reference Range & Units 03/20/24 04:26   Iron 50 - 170 ug/dL 13 (L)   TIBC 914 - 782 ug/dL 956 (L)   Iron Saturation (%) 20 - 55 % 5 (L)   Ferritin 7.3 - 270.7 ng/mL 105.6   (L): Data is abnormally low  Daily Estimated Nutrient Needs:  Energy: 1525  1830 kcals 25-30 kcal/kg using estimated dry weight, 61 kg (03/02/24 1026)]  Protein: 90-120 gm [1.5-2.0 gm/kg using estimated dry weight, 61 kg (03/02/24 1026)]    PN start date: restart 3/15 via non-tunnelled CVC  Will continue to follow daily while on PN with full assessments on Mondays    [place] Jansky MS, RD, LDN, CNSC

## 2024-03-20 NOTE — Unmapped (Signed)
 Neuro: follows commands, reflexes intact, bilateral mitts in place, pain and sedation managed w/ propofol and fentanyl  Resp: 7.5 ETT in place, PSV  CV: afib, MAP >65 maintained on norepinephrine, afebrile  GI/GU: no bowel movement during shift, NGT connected to low-intermittent suction, lasix given x1  Patient got CT abd pelvis and chest. Daughter visited and updated. See MAR/flowsheet for details.     Problem: Adult Inpatient Plan of Care  Goal: Plan of Care Review  Outcome: Ongoing - Unchanged  Goal: Patient-Specific Goal (Individualized)  Outcome: Ongoing - Unchanged  Goal: Absence of Hospital-Acquired Illness or Injury  Outcome: Ongoing - Unchanged  Intervention: Identify and Manage Fall Risk  Recent Flowsheet Documentation  Taken 03/20/2024 0800 by Kelby Fam, RN  Safety Interventions:   aspiration precautions   bed alarm   infection management   lighting adjusted for tasks/safety   low bed   room near unit station  Intervention: Prevent Skin Injury  Recent Flowsheet Documentation  Taken 03/20/2024 1800 by Kelby Fam, RN  Positioning for Skin: Right  Taken 03/20/2024 1600 by Kelby Fam, RN  Positioning for Skin: Left  Taken 03/20/2024 1400 by Kelby Fam, RN  Positioning for Skin: Right  Taken 03/20/2024 1200 by Kelby Fam, RN  Positioning for Skin: Left  Taken 03/20/2024 1000 by Kelby Fam, RN  Positioning for Skin: Right  Taken 03/20/2024 0800 by Kelby Fam, RN  Positioning for Skin: Left  Device Skin Pressure Protection:   absorbent pad utilized/changed   pressure points protected   positioning supports utilized   tubing/devices free from skin contact  Skin Protection:   adhesive use limited   incontinence pads utilized   tubing/devices free from skin contact  Intervention: Prevent and Manage VTE (Venous Thromboembolism) Risk  Recent Flowsheet Documentation  Taken 03/20/2024 1800 by Kelby Fam, RN  Anti-Embolism Device Type: SCD, Knee  Anti-Embolism Device Status: On  Anti-Embolism Device Location: BLE  Taken 03/20/2024 1600 by Kelby Fam, RN  Anti-Embolism Device Type: SCD, Knee  Anti-Embolism Device Status: On  Anti-Embolism Device Location: BLE  Taken 03/20/2024 1400 by Kelby Fam, RN  Anti-Embolism Device Type: SCD, Knee  Anti-Embolism Device Status: On  Anti-Embolism Device Location: BLE  Taken 03/20/2024 1200 by Kelby Fam, RN  Anti-Embolism Device Type: SCD, Knee  Anti-Embolism Device Status: On  Anti-Embolism Device Location: BLE  Taken 03/20/2024 1000 by Kelby Fam, RN  Anti-Embolism Device Type: SCD, Knee  Anti-Embolism Device Status: On  Anti-Embolism Device Location: BLE  Taken 03/20/2024 0800 by Kelby Fam, RN  Anti-Embolism Device Type: SCD, Knee  Anti-Embolism Device Status: On  Anti-Embolism Device Location: BLE  Intervention: Prevent Infection  Recent Flowsheet Documentation  Taken 03/20/2024 0800 by Kelby Fam, RN  Infection Prevention:   hand hygiene promoted   single patient room provided  Goal: Optimal Comfort and Wellbeing  Outcome: Ongoing - Unchanged  Goal: Readiness for Transition of Care  Outcome: Ongoing - Unchanged  Goal: Rounds/Family Conference  Outcome: Ongoing - Unchanged     Problem: Infection  Goal: Absence of Infection Signs and Symptoms  Outcome: Ongoing - Unchanged  Intervention: Prevent or Manage Infection  Recent Flowsheet Documentation  Taken 03/20/2024 0800 by Kelby Fam, RN  Infection Management: aseptic technique maintained  Isolation Precautions: contact precautions maintained     Problem: Fall Injury Risk  Goal: Absence of Fall and Fall-Related Injury  Outcome: Ongoing - Unchanged  Intervention: Promote Injury-Free  Environment  Recent Flowsheet Documentation  Taken 03/20/2024 0800 by Kelby Fam, RN  Safety Interventions:   aspiration precautions   bed alarm   infection management   lighting adjusted for tasks/safety   low bed   room near unit station Problem: Wound  Goal: Optimal Coping  Outcome: Ongoing - Unchanged  Goal: Optimal Functional Ability  Outcome: Ongoing - Unchanged  Intervention: Optimize Functional Ability  Recent Flowsheet Documentation  Taken 03/20/2024 1800 by Kelby Fam, RN  Activity Management: bedrest  Taken 03/20/2024 1600 by Kelby Fam, RN  Activity Management: bedrest  Taken 03/20/2024 1400 by Kelby Fam, RN  Activity Management: bedrest  Taken 03/20/2024 1200 by Kelby Fam, RN  Activity Management: bedrest  Taken 03/20/2024 1000 by Kelby Fam, RN  Activity Management: bedrest  Taken 03/20/2024 0800 by Kelby Fam, RN  Activity Management: bedrest  Goal: Absence of Infection Signs and Symptoms  Outcome: Ongoing - Unchanged  Intervention: Prevent or Manage Infection  Recent Flowsheet Documentation  Taken 03/20/2024 0800 by Kelby Fam, RN  Infection Management: aseptic technique maintained  Isolation Precautions: contact precautions maintained  Goal: Improved Oral Intake  Outcome: Ongoing - Unchanged  Goal: Optimal Pain Control and Function  Outcome: Ongoing - Unchanged  Goal: Skin Health and Integrity  Outcome: Ongoing - Unchanged  Intervention: Optimize Skin Protection  Recent Flowsheet Documentation  Taken 03/20/2024 1800 by Kelby Fam, RN  Activity Management: bedrest  Head of Bed [facility]) Positioning: HOB at 30-45 degrees  Taken 03/20/2024 1600 by Kelby Fam, RN  Activity Management: bedrest  Head of Bed [facility]) Positioning: HOB at 30-45 degrees  Taken 03/20/2024 1400 by Kelby Fam, RN  Activity Management: bedrest  Head of Bed [facility]) Positioning: HOB at 30-45 degrees  Taken 03/20/2024 1200 by Kelby Fam, RN  Activity Management: bedrest  Head of Bed [facility]) Positioning: HOB at 30-45 degrees  Taken 03/20/2024 1000 by Kelby Fam, RN  Activity Management: bedrest  Taken 03/20/2024 0800 by Kelby Fam, RN  Activity Management: bedrest  Pressure Reduction Techniques:   heels elevated off bed   weight shift assistance provided  Head of Bed (HOB) Positioning: HOB at 30-45 degrees  Pressure Reduction Devices:   pressure-redistributing mattress utilized   specialty bed utilized  Skin Protection:   adhesive use limited   incontinence pads utilized   tubing/devices free from skin contact  Goal: Optimal Wound Healing  Outcome: Ongoing - Unchanged     Problem: Skin Injury Risk Increased  Goal: Skin Health and Integrity  Outcome: Ongoing - Unchanged  Intervention: Optimize Skin Protection  Recent Flowsheet Documentation  Taken 03/20/2024 1800 by Kelby Fam, RN  Activity Management: bedrest  Head of Bed [facility]) Positioning: HOB at 30-45 degrees  Taken 03/20/2024 1600 by Kelby Fam, RN  Activity Management: bedrest  Head of Bed [facility]) Positioning: HOB at 30-45 degrees  Taken 03/20/2024 1400 by Kelby Fam, RN  Activity Management: bedrest  Head of Bed [facility]) Positioning: HOB at 30-45 degrees  Taken 03/20/2024 1200 by Kelby Fam, RN  Activity Management: bedrest  Head of Bed [facility]) Positioning: HOB at 30-45 degrees  Taken 03/20/2024 1000 by Kelby Fam, RN  Activity Management: bedrest  Taken 03/20/2024 0800 by Kelby Fam, RN  Activity Management: bedrest  Pressure Reduction Techniques:   heels elevated off bed   weight shift assistance provided  Head of Bed (HOB) Positioning: HOB at 30-45 degrees  Pressure Reduction Devices:   pressure-redistributing mattress utilized   specialty bed utilized  Skin Protection:   adhesive use limited   incontinence pads utilized   tubing/devices free from skin contact     Problem: Self-Care Deficit  Goal: Improved Ability to Complete Activities of Daily Living  Outcome: Ongoing - Unchanged     Problem: Artificial Airway  Goal: Effective Communication  Outcome: Ongoing - Unchanged  Goal: Optimal Device Function  Outcome: Ongoing - Unchanged  Intervention: Optimize Device Care and Function  Recent Flowsheet Documentation  Taken 03/20/2024 1800 by Kelby Fam, RN  Oral Care:   mouth swabbed   suction provided  Taken 03/20/2024 1600 by Kelby Fam, RN  Oral Care:   mouth swabbed   suction provided  Taken 03/20/2024 1200 by Kelby Fam, RN  Oral Care:   mouth swabbed   suction provided  Taken 03/20/2024 0800 by Kelby Fam, RN  Airway/Ventilation Management: airway patency maintained  Aspiration Precautions:   respiratory status monitored   oral hygiene care promoted   upright posture maintained  Oral Care:   mouth swabbed   suction provided  Goal: Absence of Device-Related Skin or Tissue Injury  Outcome: Ongoing - Unchanged  Intervention: Maintain Skin and Tissue Health  Recent Flowsheet Documentation  Taken 03/20/2024 0800 by Kelby Fam, RN  Device Skin Pressure Protection:   absorbent pad utilized/changed   pressure points protected   positioning supports utilized   tubing/devices free from skin contact     Problem: Malnutrition  Goal: Improved Nutritional Intake  Outcome: Ongoing - Unchanged     Problem: Mechanical Ventilation Invasive  Goal: Effective Communication  Outcome: Ongoing - Unchanged  Goal: Optimal Device Function  Outcome: Ongoing - Unchanged  Intervention: Optimize Device Care and Function  Recent Flowsheet Documentation  Taken 03/20/2024 1800 by Kelby Fam, RN  Oral Care:   mouth swabbed   suction provided  Taken 03/20/2024 1600 by Kelby Fam, RN  Oral Care:   mouth swabbed   suction provided  Taken 03/20/2024 1200 by Kelby Fam, RN  Oral Care:   mouth swabbed   suction provided  Taken 03/20/2024 0800 by Kelby Fam, RN  Airway/Ventilation Management: airway patency maintained  Oral Care:   mouth swabbed   suction provided  Goal: Mechanical Ventilation Liberation  Outcome: Ongoing - Unchanged  Goal: Optimal Nutrition Delivery  Outcome: Ongoing - Unchanged  Goal: Absence of Device-Related Skin and Tissue Injury  Outcome: Ongoing - Unchanged  Intervention: Maintain Skin and Tissue Health  Recent Flowsheet Documentation  Taken 03/20/2024 0800 by Kelby Fam, RN  Device Skin Pressure Protection:   absorbent pad utilized/changed   pressure points protected   positioning supports utilized   tubing/devices free from skin contact  Goal: Absence of Ventilator-Induced Lung Injury  Outcome: Ongoing - Unchanged  Intervention: Prevent Ventilator-Associated Pneumonia  Recent Flowsheet Documentation  Taken 03/20/2024 1800 by Kelby Fam, RN  Head of Bed [facility]) Positioning: HOB at 30-45 degrees  Oral Care:   mouth swabbed   suction provided  Taken 03/20/2024 1600 by Kelby Fam, RN  Head of Bed [facility]) Positioning: HOB at 30-45 degrees  Oral Care:   mouth swabbed   suction provided  Taken 03/20/2024 1400 by Kelby Fam, RN  Head of Bed [facility]) Positioning: HOB at 30-45 degrees  Taken 03/20/2024 1200 by Kelby Fam, RN  Head of Bed [facility]) Positioning: HOB at 30-45 degrees  Oral  Care:   mouth swabbed   suction provided  Taken 03/20/2024 0800 by Kelby Fam, RN  Head of Bed [facility]) Positioning: HOB at 30-45 degrees  Oral Care:   mouth swabbed   suction provided

## 2024-03-20 NOTE — Unmapped (Signed)
 [facility] PROGRESS NOTE     Date of Service: 03/20/2024    Hospital Day: LOS: 22 days        Surgery Date: TBD   Surgical Attending: Joanie Coddington, MD    Critical Care Attending: Lia Hopping, MD    Interval History:   Intubated yesterday morning. Bronchoscopy performed with copious, thick, clear secretions from L lung. BAL sent for culture.    Gastroenterology team consulted for possible jejunostomy tube placement.    Lasix 80mg  IV x2 yesterday with 3L UOP.     History of Present Illness:   Tammy Braun is a 83 y.o. female with PMH bladder cancer with cystectomy and ileal conduit, COPD, recent dx of afib, moderate-severe TR, CAD (high calcification score), T2DM presented to [facility] with projectile vomiting and abdominal pain, found to have SBO with transition point at level of ileostomy on CT A/P. On initial presentation, labs remarkable for leukocytosis thought to be iso hemoconcentration, AKI on CKD, mildly elevated venous lactate improved with resuscitation. Admitted to [facility] for worsening hypoxia on HFNC requiring intubation in setting of hiatal hernia. OR on 02/28/24 for ex-lap, reduciton of parastomal hernia, small bowel resection with primary anastomosis and lysis of adhesions.    Hospital Course:  - 02/26/24: Admitted to [facility], floor status, consult to urology for possible ileal conduit revision.   - 02/27/24: Patient level of care escalated to step-down iso increased oxygen needs while in ED.  - 02/28/24: Upgraded to ICU. Intubated at bed-side. OR for ex-lap, reduction of parastomal hernia, small bowel resection with primary anastomosis, lysis of adhesions, abdomen left open with ABThera in place.   - 03/01/24: OR for attempt at abdominal closure. Left open due to desaturations during closure attempt. ABThera in place.  - 03/03/24: RTOR for abdominal closure  -03/06/24: Vas cath placed, CRRT started   -03/09/24: Extubated to HFNC  -03/10/24: CRRT discontinued   - 03/11/24: Re-intubated for hypoxic respiratory failure 2/2 mucus plug   - 03/14/24 Extubated to HFNC   -03/15/24: Vas cath removed   -03/16/24: Placed on BiPap for complete collapse of L lung 2/2 probable mucus plugging  -03/19/24: Re-intubated for mucus plugging. Therapeutic bronch, BAL sent.      ASSESSMENT & PLAN:     Neurologic:  - Pain: tylenol SCH, fent gtt and pushes PRN  - Sedation: propofol       #schizoaffective disorder  - Monthly prolixin injection (given 3/14)    Cardiovascular:  - MAP goal > 65  - NE @2  iso sedation      #HFpEF  #Pulm HTN  - ECHO 3/12- LVEF 70%, moderately dilated RV with moderate systolic dysfunction, severe pulmonary hypertension.    #Hx of HTN  -holding home amlodipine    #Hx of HLD  - atorvastatin    #Afib/flutter w RVR:   - Prior hx of paroxysmal afib. CHADSvasc = 6, no AC PTA per chart review.      > Holding home diltiazem, question efficacy given likely poor absorption  - Amio via NG tube 200mg  daily   - heparin gtt for [facility]     #Pulmonary embolism  - 3/5 CTA chest: Filling defect in L anterior segmental artery  - heparin gtt     #Acute thrombus right basilic   - Identified 3/3 on BUE duplex  - Supportive care, superficial     Respiratory:  #Acute on chronic hypoxemia  #Hx of COPD  - Aggressive pulmonary toilet  - Continue Brovana &  atrovent nebs  - Continue hypertonic saline nebs for clearance     #Aspiration  #Poor pulmonary clearance   - Re-intubated 3/21 for mucous plugging of L lung and inability to clear secretions  - CT Chest today to evaluate for intervenable pleural effusions with ultimate goal of optimized extubation    #Pseudomonas Pneumonia   #VAP  - see ID for abx plan    FEN/GI:  F: ML  E: Replete electrolytes PRN  N: NPO, TF held iso multiple aspiration episodes   - Renew TPN  - GI prophylaxis: protonix  - Bowel regimen: increase miralax and senna to BID   - Last BM: 3/17    #S/p ex-lap, reduction of parastomal hernia, small bowel resection with primary anastomosis, lysis of adhesions  - CT 3/13 demonstrating partial small bowel obstruction  - Blake drain#2 removed 3/16  - Blake drain #1 removed 3/17  - WTD dressing changes BID per nursing to midline incision    > midline incision drainage culture pending   - Repeat CT to evaluate for dilated loops of bowel given recent pSBO    #Large hiatal hernia, chronic  - re-eval on CT chest/abd/pelvis 3/13 stable    Renal/Genitourinary:  #AKI on CKD  - Nephrology signed off, HD cath removed  - hold on diuresis before CT scan, re-evaluate in PM     #H/o bladder cancer s/p radical cystectomy with ileal conduit (2012):  #Parastomal hernia:  - Strict I&Os  - Ok for no foley in stoma at this time per urology    Endocrine:   #hyperglycemia   - Glycemic Control: SSI, increase to NPH 15 unit BID    Hematologic:   - Hgb stable  - Monitor CBC daily, transfuse for hgb 7mg /dL  - DVT ppx: heparin gtt for PE    Immunologic/Infectious Disease:  - Afebrile with resolving leukocytosis     - Cultures:   - Blood cultures (3/4): negative, final   - BAL (3/4): Pseudomonas aeruginosa - susceptible to meropenum    - BAL (3/8): Pseudomonas aeruginosa and candida glabrata  -3/12: Susceptibilities show resistance to Meropenum, Cefepime, Ceftaz and Zosyn.   - Bcx (3/11): Negative, final  - BAL (3/12): 600,000+ psuedomonas (susceptibilities: resistant to meropenum)  - Ucx (3/13): no growth  - BAL (3/20): pending    - Antimicrobials:    - Ceftriaxone 2/27- 2/28 - colonized UTI   - Zosyn (3/1 - 3/5) for abdominal contamination  - Zosyn (3/6) for pseudomonas when sensitivities remained pending  - Zerbaxa (3/12-3/21) for 10 day course    Musculoskeletal:   #Deconditioned   - PT/OT consulted     Daily Care Checklist:   - Stress Ulcer Prevention: No  - DVT Prophylaxis: Chemical: Heparin drip  - Daily Awakening: Yes  - Spontaneous Breathing Trial: Yes  - Indication for Central/PICC Line: Yes  Infusions requiring central access, Hemodynamic monitoring, and Inadequate peripheral access, LIJ triple lumen  - Indication for Urinary Catheter: No   - Diagnostic images/reports of past 24hrs reviewed: Yes    Disposition:   - Continue ICU care  - PT/OT consulted:yes     SUBJECTIVE:      Intubated, sedated. Shakes head no to question of pain.      OBJECTIVE:     Physical Exam:  Constitutional: Layin in bed, intubated, lightly sedated  Neurologic: Opens eyes to voice. Nods yes/no appropriately. Moves all 4 extremities to command  Respiratory: intubated and mechanically ventilated   Cardiovascular:  Irregularly  irregular rhythm ~110bpm   Gastrointestinal: Soft abdomen, midline incision staples in tact. 2 points of murky drainage from midline incision. Staples removed at these sites and probed with Qtip. Fascia in tact. Swab sent for culture. Packed with WTD dressing. Urostomy draining clear yellow urine.   Musculoskeletal: Trace BLE/BUE edema.  Skin: Warm and dry     Temp:  [36.5 ??C (97.7 ??F)-36.8 ??C (98.3 ??F)] 36.8 ??C (98.3 ??F)  Heart Rate:  [84-129] 119  SpO2 Pulse:  [79-129] 119  Resp:  [14-29] 15  BP: (68-165)/(31-137) 115/61  MAP (mmHg):  [62-147] 74  FiO2 (%):  [40 %-100 %] 40 %  SpO2:  [99 %-100 %] 100 %     Recent Laboratory Results:  Recent Labs     Units 03/19/24  1637   PHART  7.35   PCO2ART mm Hg 43.5   PO2ART mm Hg 179.0*   HCO3ART mmol/L 23   BEART  -1.7   O2SATART % 99.8           Recent Labs     Units 03/19/24  0548 03/19/24  1718 03/20/24  0426   NA mmol/L 147* 146* 148*   K mmol/L 3.7 3.8 3.9   CL mmol/L 109* 110* 110*   CO2 mmol/L 26.0 26.0 27.0   BUN mg/dL 65* 66* 64*   CREATININE mg/dL 2.13* 0.86* 5.78*   GLU mg/dL 469* 629* 528*     Lab Results   Component Value Date    BILITOT 0.3 03/15/2024    BILITOT 0.4 03/14/2024    BILIDIR 0.20 03/15/2024    BILIDIR 0.20 03/14/2024    ALT 10 03/15/2024    ALT 11 03/14/2024    AST 13 03/15/2024    AST 13 03/14/2024    GGT 16 09/17/2011    GGT 354 (H) 05/03/2011    ALKPHOS 60 03/15/2024    ALKPHOS 58 03/14/2024    PROT 5.7 03/15/2024    PROT 5.4 (L) 03/14/2024    ALBUMIN 1.9 (L) 03/15/2024    ALBUMIN 1.8 (L) 03/14/2024     Recent Labs     Units 03/19/24  1304 03/19/24  1717 03/20/24  0005 03/20/24  0518   POCGLU mg/dL 413* 244 010* 272*     Recent Labs     Units 03/18/24  0325 03/19/24  0548 03/20/24  0426   WBC 10*9/L 14.0* 11.6* 11.8*   RBC 10*12/L 2.90* 2.86* 2.74*   HGB g/dL 7.8* 7.6* 7.4*   HCT % 24.7* 24.5* 23.3*   MCV fL 85.0 85.7 85.3   MCH pg 26.9 26.7 27.1   MCHC g/dL 53.6* 64.4* 03.4*   RDW % 17.4* 18.4* 18.2*   PLT 10*9/L 256 268 281   MPV fL 9.6 9.4 9.9     Recent Labs     Units 03/19/24  0548 03/20/24  0426   APTT sec 95.2* 69.4*        Lines & Tubes:   Patient Lines/Drains/Airways Status       Active Peripheral & Central Intravenous Access       Name Placement date Placement time Site Days    CVC Triple Lumen 03/07/24 Non-tunneled Left Internal jugular 03/07/24  0849  Internal jugular  12                     Patient Lines/Drains/Airways Status       Active Wounds       Name Placement date Placement time Site  Days    Surgical Site 04/08/18 Shoulder Left 04/08/18  1338  -- 2172    Surgical Site 06/05/22 Eye Left 06/05/22  0928  -- 653    Surgical Site 06/19/22 Eye Right 06/19/22  1024  -- 639    Surgical Site 03/03/24 Abdomen 03/03/24  1013  -- 16    Wound 06/24/20 Other (comment) Buttocks inner right buttock 06/24/20  1010  Buttocks  1364    Wound 03/11/24 Other (comment) Sacrum Mid moisture/friction injury 03/11/24  1200  Sacrum  8    Wound 03/12/24 Other (comment) Groin Right 03/12/24  1500  Groin  7    Wound 03/13/24 Neck Right open area note arount the HD catheter durng the dressing change 03/13/24  0930  Neck  6                     Respiratory/ventilator settings for last 24 hours:   Vent Mode: PSV-CPAP  FiO2 (%): 40 %  S RR: 15  S VT: 320 mL  PEEP: 8 cm H20  PR SUP: 8 cm H20    Intake/Output last 3 shifts:  I/O last 3 completed shifts:  In: 3253.6 [I.V.:573; NG/GT:120; IV Piggyback:578.5]  Out: 4350 [Urine:4250; Emesis/NG output:100]    Daily/Recent Weight:  70.2 kg (154 lb 12.2 oz)    BMI:  Body mass index is 29.26 kg/m??.    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    Chronic kidney disease     Depression, psychotic (CMS-HCC)     Diabetes mellitus (CMS-HCC)     in past    Emphysema of lung (CMS-HCC)     Financial difficulties     Frail elderly     Hearing impairment     Hernia     History of transfusion     Hyperlipidemia     Hypertension     Impaired mobility     Osteoporosis     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/08/2015    Visual impairment      Past Surgical History:   Procedure Laterality Date    ABDOMINAL SURGERY      BLADDER SURGERY      BREAST CYST EXCISION      CHEMOTHERAPY  2012    bladder    GALLBLADDER SURGERY      stone removal    ILEOSTOMY  2012    PR COLONOSCOPY FLX DX W/COLLJ SPEC WHEN PFRMD N/A 02/09/2015    Procedure: COLONOSCOPY, FLEXIBLE, PROXIMAL TO SPLENIC FLEXURE; DIAGNOSTIC, W/WO COLLECTION SPECIMEN BY BRUSH OR WASH;  Surgeon: Dewaine Conger, MD;  Location: [facility] [place];  Service: Gastroenterology    PR EXPLORATORY OF ABDOMEN N/A 02/28/2024    Procedure: EXPLORATORY LAPAROTOMY, EXPLORATORY CELIOTOMY WITH OR WITHOUT BIOPSY(S);  Surgeon: Renda Rolls, MD;  Location: [facility];  Service: General Surgery    PR RECONSTR TOTAL SHOULDER IMPLANT Left 04/08/2018    Procedure: ARTHROPLASTY, GLENOHUMERAL JOINT; TOTAL SHOULDER(GLENOID & PROXIMAL HUMERAL REPLACEMENT(EG, TOTAL SHOULDER);  Surgeon: Tomasa Rand, MD;  Location[facility] OR [facility];  Service: Ortho Sports Medicine    PR REOPEN RECENT ABD EXPLORATORY Midline 03/01/2024    Procedure: REOPENING OF RECENT LAPAROTOMY;  Surgeon: Laural Benes,  Mirian Mo., MD;  Location: OR [facility];  Service: Trauma    PR REOPEN RECENT ABD EXPLORATORY N/A 03/03/2024    Procedure: REOPENING OF RECENT LAPAROTOMY;  Surgeon: Joanie Coddington, MD;  Location: OR [facility];  Service: Trauma    PR SIGMOIDOSCOPY,BIOPSY N/A 03/11/2015    Procedure: SIGMOIDOSCOPY, FLEXIBLE; WITH BIOPSY, SINGLE OR MULTIPLE;  Surgeon: Wilburt Finlay, MD;  [facility];  Service: Gastroenterology    PR XCAPSL CTRC RMVL INSJ IO LENS PROSTH W/O ECP Left 06/05/2022    Procedure: EXTRACAPSULAR CATARACT REMOVAL W/INSERTION OF INTRAOCULAR LENS PROSTHESIS, MANUAL OR MECHANICAL TECHNIQUE WITHOUT ENDOSCOPIC CYCLOPHOTOCOAGULATION;  Surgeon: Garner Gavel, MD;  Location: [facility] OR [facility];  Service: Ophthalmology    PR XCAPSL CTRC RMVL INSJ IO LENS PROSTH W/O ECP Right 06/19/2022    Procedure: EXTRACAPSULAR CATARACT REMOVAL W/INSERTION OF INTRAOCULAR LENS PROSTHESIS, MANUAL OR MECHANICAL TECHNIQUE WITHOUT ENDOSCOPIC CYCLOPHOTOCOAGULATION;  Surgeon: Garner Gavel, MD;  Location: [facility] OR [facility];  Service: Ophthalmology     Scheduled Medications:   acetaminophen  1,000 mg Enteral tube: gastric Q8H    amiodarone  200 mg Enteral tube: gastric Daily    arformoterol  15 mcg Nebulization BID (RT)    aspirin  81 mg Enteral tube: gastric Daily    atorvastatin  40 mg Enteral tube: gastric Daily    carboxymethylcellulose sodium  2 drop Both Eyes TID    ceftolozane-tazobactam  750 mg Intravenous Q8H    flu vacc [id number](98yr up)-PF  0.5 mL Intramuscular During hospitalization    fluPHENAZine decanoate  50 mg Intramuscular Q30 Days    insulin NPH  10 Units Subcutaneous BID AC    insulin regular  0-20 Units Subcutaneous Q6H SCH    ipratropium  500 mcg Nebulization Q6H (RT)    pantoprazole (Protonix) intravenous solution  40 mg Intravenous Daily    polyethylene glycol  17 g Enteral tube: gastric Daily    sennosides  2.5 mL Enteral tube: gastric Nightly    sodium chloride  10 mL Intravenous Q8H    sodium chloride  10 mL Intravenous Q8H    sodium chloride  10 mL Intravenous Q8H    sodium chloride  4 mL Nebulization Q6H (RT)     Continuous Infusions: Parenteral Nutrition (CENTRAL) 45 mL/hr at 03/20/24 0503    And    fat emulsion 20 % with fish oil 20.8 mL/hr at 03/20/24 0503    fentaNYL citrate (PF) 50 mcg/mL infusion 25 mcg/hr (03/20/24 0503)    heparin 18 Units/kg/hr (03/20/24 0503)    NORepinephrine bitartrate-NS 2 mcg/min (03/20/24 0503)    propofol 10 mg/mL infusion 10 mcg/kg/min (03/20/24 0503)     PRN Medications:  albuterol, calcium gluconate, calcium gluconate, dextrose in water, fentaNYL (PF) **OR** fentaNYL (PF), heparin (porcine), heparin (porcine), magnesium sulfate in water, melatonin, nicotine polacrilex **OR** nicotine polacrilex, ondansetron, phenol, potassium chloride in water    Oliver Hum, MD   Surgical Intensive Care Unit  [place] [facility] [place]

## 2024-03-20 NOTE — Unmapped (Signed)
 Problem: Mechanical Ventilation Invasive  Goal: Optimal Device Function  Intervention: Optimize Device Care and Function  Recent Flowsheet Documentation  Taken 03/19/2024 2059 by Elvia Collum, RRT  Oral Care:   oral rinse provided   suction provided   teeth brushed   tongue brushed  Goal: Absence of Ventilator-Induced Lung Injury  Intervention: Prevent Ventilator-Associated Pneumonia  Recent Flowsheet Documentation  Taken 03/20/2024 0149 by Elvia Collum, RRT  Head of Bed [facility]) Positioning: HOB at 30-45 degrees  Taken 03/19/2024 2059 by Elvia Collum, RRT  Head of Bed [facility]) Positioning: HOB at 30-45 degrees  Oral Care:   oral rinse provided   suction provided   teeth brushed   tongue brushed     Problem: Mechanical Ventilation Invasive  Goal: Effective Communication  Outcome: Ongoing - Unchanged  Goal: Optimal Device Function  Outcome: Ongoing - Unchanged  Intervention: Optimize Device Care and Function  Recent Flowsheet Documentation  Taken 03/19/2024 2059 by Elvia Collum, RRT  Oral Care:   oral rinse provided   suction provided   teeth brushed   tongue brushed  Goal: Mechanical Ventilation Liberation  Outcome: Ongoing - Unchanged  Goal: Absence of Device-Related Skin and Tissue Injury  Outcome: Ongoing - Unchanged  Goal: Absence of Ventilator-Induced Lung Injury  Outcome: Ongoing - Unchanged  Intervention: Prevent Ventilator-Associated Pneumonia  Recent Flowsheet Documentation  Taken 03/20/2024 0149 by Elvia Collum, RRT  Head of Bed [facility]) Positioning: HOB at 30-45 degrees  Taken 03/19/2024 2059 by Elvia Collum, RRT  Head of Bed [facility]) Positioning: HOB at 30-45 degrees  Oral Care:   oral rinse provided   suction provided   teeth brushed   tongue brushed     Problem: Artificial Airway  Goal: Effective Communication  Outcome: Ongoing - Unchanged  Goal: Optimal Device Function  Outcome: Ongoing - Unchanged  Intervention: Optimize Device Care and Function  Recent Flowsheet Documentation  Taken 03/19/2024 2059 by Elvia Collum, RRT  Oral Care:   oral rinse provided   suction provided   teeth brushed   tongue brushed  Goal: Absence of Device-Related Skin or Tissue Injury  Outcome: Ongoing - Unchanged

## 2024-03-21 LAB — BASIC METABOLIC PANEL
ANION GAP: 9 mmol/L (ref 5–14)
ANION GAP: 10 mmol/L (ref 5–14)
BLOOD UREA NITROGEN: 67 mg/dL — ABNORMAL HIGH (ref 9–23)
BLOOD UREA NITROGEN: 72 mg/dL — ABNORMAL HIGH (ref 9–23)
BUN / CREAT RATIO: 57
BUN / CREAT RATIO: 59
CALCIUM: 9.2 mg/dL (ref 8.7–10.4)
CALCIUM: 9.4 mg/dL (ref 8.7–10.4)
CHLORIDE: 111 mmol/L — ABNORMAL HIGH (ref 98–107)
CHLORIDE: 110 mmol/L — ABNORMAL HIGH (ref 98–107)
CO2: 28 mmol/L (ref 20.0–31.0)
CO2: 28 mmol/L (ref 20.0–31.0)
CREATININE: 1.18 mg/dL — ABNORMAL HIGH (ref 0.55–1.02)
CREATININE: 1.23 mg/dL — ABNORMAL HIGH (ref 0.55–1.02)
EGFR CKD-EPI (2021) FEMALE: 46 mL/min/{1.73_m2} — ABNORMAL LOW (ref >=60)
EGFR CKD-EPI (2021) FEMALE: 44 mL/min/{1.73_m2} — ABNORMAL LOW (ref >=60)
GLUCOSE RANDOM: 150 mg/dL (ref 70–179)
GLUCOSE RANDOM: 159 mg/dL (ref 70–179)
POTASSIUM: 4.2 mmol/L (ref 3.4–4.8)
POTASSIUM: 4 mmol/L (ref 3.4–4.8)
SODIUM: 148 mmol/L — ABNORMAL HIGH (ref 135–145)
SODIUM: 148 mmol/L — ABNORMAL HIGH (ref 135–145)

## 2024-03-21 LAB — SELENIUM SERUM: SELENIUM: 76 ug/L — ABNORMAL LOW

## 2024-03-21 LAB — APTT
APTT: 126.2 s — ABNORMAL HIGH (ref 24.8–38.4)
APTT: 190.9 s (ref 24.8–38.4)
APTT: 143.6 s — ABNORMAL HIGH (ref 24.8–38.4)
APTT: 72.3 s — ABNORMAL HIGH (ref 24.8–38.4)
HEPARIN CORRELATION: 0.7
HEPARIN CORRELATION: 1.1
HEPARIN CORRELATION: 0.8
HEPARIN CORRELATION: 0.4

## 2024-03-21 LAB — ZINC: ZINC: 72 ug/dL

## 2024-03-21 LAB — COPPER, SERUM: COPPER: 96 ug/dL

## 2024-03-21 LAB — PHOSPHORUS
PHOSPHORUS: 5.1 mg/dL (ref 2.4–5.1)
PHOSPHORUS: 4.4 mg/dL (ref 2.4–5.1)

## 2024-03-21 LAB — CBC
HEMATOCRIT: 23 % — ABNORMAL LOW (ref 34.0–44.0)
HEMOGLOBIN: 7.4 g/dL — ABNORMAL LOW (ref 11.3–14.9)
MEAN CORPUSCULAR HEMOGLOBIN CONC: 32.1 g/dL (ref 32.0–36.0)
MEAN CORPUSCULAR HEMOGLOBIN: 27.6 pg (ref 25.9–32.4)
MEAN CORPUSCULAR VOLUME: 85.9 fL (ref 77.6–95.7)
MEAN PLATELET VOLUME: 9.9 fL (ref 6.8–10.7)
PLATELET COUNT: 244 10*9/L (ref 150–450)
RED BLOOD CELL COUNT: 2.67 10*12/L — ABNORMAL LOW (ref 3.95–5.13)
RED CELL DISTRIBUTION WIDTH: 17.9 % — ABNORMAL HIGH (ref 12.2–15.2)
WBC ADJUSTED: 12.8 10*9/L — ABNORMAL HIGH (ref 3.6–11.2)

## 2024-03-21 LAB — VITAMIN D 25 HYDROXY: VITAMIN D, TOTAL (25OH): 10.8 ng/mL — ABNORMAL LOW (ref 20.0–80.0)

## 2024-03-21 LAB — MAGNESIUM
MAGNESIUM: 2.4 mg/dL (ref 1.6–2.6)
MAGNESIUM: 2.2 mg/dL (ref 1.6–2.6)

## 2024-03-21 MED ADMIN — sodium chloride 3 % NEBULIZER solution 4 mL: 4 mL | RESPIRATORY_TRACT | @ 14:00:00

## 2024-03-21 MED ADMIN — acetaminophen (TYLENOL) tablet 1,000 mg: 1000 mg | GASTROENTERAL | @ 10:00:00

## 2024-03-21 MED ADMIN — sodium chloride (NS) 0.9 % flush 10 mL: 10 mL | INTRAVENOUS | @ 10:00:00

## 2024-03-21 MED ADMIN — methocarbamol (ROBAXIN) tablet 500 mg: 500 mg | GASTROENTERAL | @ 17:00:00

## 2024-03-21 MED ADMIN — Parenteral Nutrition (CENTRAL): INTRAVENOUS | @ 02:00:00 | Stop: 2024-03-21

## 2024-03-21 MED ADMIN — heparin 25,000 Units/250 mL (100 units/mL) in 0.45% saline infusion (premade): 0-24 [IU]/kg/h | INTRAVENOUS | @ 05:00:00

## 2024-03-21 MED ADMIN — ipratropium (ATROVENT) 0.02 % nebulizer solution 500 mcg: 500 ug | RESPIRATORY_TRACT | @ 01:00:00

## 2024-03-21 MED ADMIN — propofol (DIPRIVAN) infusion 10 mg/mL: 0-10 ug/kg/min | INTRAVENOUS | @ 05:00:00

## 2024-03-21 MED ADMIN — sodium ferric gluconate (FERRLECIT) 125 mg in sodium chloride (NS) 0.9 % 100 mL IVPB: 125 mg | INTRAVENOUS | @ 12:00:00 | Stop: 2024-03-21

## 2024-03-21 MED ADMIN — fentaNYL PF (SUBLIMAZE) (50 mcg/mL) infusion (bag): 0-50 ug/h | INTRAVENOUS | @ 05:00:00 | Stop: 2024-04-02

## 2024-03-21 MED ADMIN — amiodarone (PACERONE) tablet 200 mg: 200 mg | GASTROENTERAL | @ 12:00:00

## 2024-03-21 MED ADMIN — insulin NPH (HumuLIN,NovoLIN) injection 15 Units: 15 [IU] | SUBCUTANEOUS | @ 11:00:00

## 2024-03-21 MED ADMIN — ipratropium (ATROVENT) 0.02 % nebulizer solution 500 mcg: 500 ug | RESPIRATORY_TRACT | @ 14:00:00

## 2024-03-21 MED ADMIN — esomeprazole (NEXIUM) granules 40 mg: 40 mg | GASTROENTERAL | @ 10:00:00

## 2024-03-21 MED ADMIN — furosemide (LASIX) injection 40 mg: 40 mg | INTRAVENOUS | @ 13:00:00 | Stop: 2024-03-21

## 2024-03-21 MED ADMIN — insulin regular (HumuLIN,NovoLIN) injection 0-20 Units: 0-20 [IU] | SUBCUTANEOUS | @ 22:00:00

## 2024-03-21 MED ADMIN — acetaminophen (TYLENOL) tablet 1,000 mg: 1000 mg | GASTROENTERAL | @ 02:00:00

## 2024-03-21 MED ADMIN — ipratropium (ATROVENT) 0.02 % nebulizer solution 500 mcg: 500 ug | RESPIRATORY_TRACT | @ 08:00:00

## 2024-03-21 MED ADMIN — aspirin chewable tablet 81 mg: 81 mg | GASTROENTERAL | @ 12:00:00

## 2024-03-21 MED ADMIN — sennosides (SENOKOT) oral syrup: 2.5 mL | GASTROENTERAL | @ 12:00:00

## 2024-03-21 MED ADMIN — insulin NPH (HumuLIN,NovoLIN) injection 15 Units: 15 [IU] | SUBCUTANEOUS | @ 21:00:00

## 2024-03-21 MED ADMIN — sodium chloride (NS) 0.9 % flush 10 mL: 10 mL | INTRAVENOUS | @ 18:00:00

## 2024-03-21 MED ADMIN — sodium ferric gluconate (FERRLECIT) 125 mg in sodium chloride (NS) 0.9 % 100 mL IVPB: 125 mg | INTRAVENOUS | @ 02:00:00 | Stop: 2024-03-22

## 2024-03-21 MED ADMIN — polyethylene glycol (MIRALAX) packet 17 g: 17 g | GASTROENTERAL | @ 12:00:00

## 2024-03-21 MED ADMIN — atorvastatin (LIPITOR) tablet 40 mg: 40 mg | GASTROENTERAL | @ 12:00:00

## 2024-03-21 MED ADMIN — furosemide (LASIX) injection 40 mg: 40 mg | INTRAVENOUS | @ 21:00:00 | Stop: 2024-03-21

## 2024-03-21 MED ADMIN — NORepinephrine 8 mg in dextrose 5 % 250 mL (32 mcg/mL) infusion PMB: 0-10 ug/min | INTRAVENOUS | @ 19:00:00

## 2024-03-21 MED ADMIN — sodium chloride 3 % NEBULIZER solution 4 mL: 4 mL | RESPIRATORY_TRACT | @ 08:00:00

## 2024-03-21 MED ADMIN — polyethylene glycol (MIRALAX) packet 17 g: 17 g | GASTROENTERAL | @ 02:00:00

## 2024-03-21 MED ADMIN — sodium chloride 3 % NEBULIZER solution 4 mL: 4 mL | RESPIRATORY_TRACT | @ 01:00:00

## 2024-03-21 MED ADMIN — sodium chloride (NS) 0.9 % flush 10 mL: 10 mL | INTRAVENOUS | @ 02:00:00

## 2024-03-21 MED ADMIN — acetaminophen (TYLENOL) tablet 1,000 mg: 1000 mg | GASTROENTERAL | @ 18:00:00

## 2024-03-21 MED ADMIN — sennosides (SENOKOT) oral syrup: 2.5 mL | GASTROENTERAL | @ 02:00:00

## 2024-03-21 MED ADMIN — carboxymethylcellulose sodium (THERATEARS) 0.25 % ophthalmic solution 2 drop: 2 [drp] | OPHTHALMIC | @ 02:00:00

## 2024-03-21 MED ADMIN — arformoterol (BROVANA) nebulizer solution 15 mcg/2 mL: 15 ug | RESPIRATORY_TRACT | @ 12:00:00

## 2024-03-21 MED ADMIN — carboxymethylcellulose sodium (THERATEARS) 0.25 % ophthalmic solution 2 drop: 2 [drp] | OPHTHALMIC | @ 18:00:00

## 2024-03-21 MED ADMIN — insulin regular (HumuLIN,NovoLIN) injection 0-20 Units: 0-20 [IU] | SUBCUTANEOUS | @ 11:00:00

## 2024-03-21 MED ADMIN — sodium chloride 3 % NEBULIZER solution 4 mL: 4 mL | RESPIRATORY_TRACT | @ 20:00:00

## 2024-03-21 MED ADMIN — methocarbamol (ROBAXIN) tablet 500 mg: 500 mg | GASTROENTERAL | @ 21:00:00

## 2024-03-21 MED ADMIN — arformoterol (BROVANA) nebulizer solution 15 mcg/2 mL: 15 ug | RESPIRATORY_TRACT | @ 01:00:00

## 2024-03-21 MED ADMIN — ipratropium (ATROVENT) 0.02 % nebulizer solution 500 mcg: 500 ug | RESPIRATORY_TRACT | @ 20:00:00

## 2024-03-21 MED ADMIN — carboxymethylcellulose sodium (THERATEARS) 0.25 % ophthalmic solution 2 drop: 2 [drp] | OPHTHALMIC | @ 12:00:00

## 2024-03-21 NOTE — Unmapped (Signed)
 Problem: Adult Inpatient Plan of Care  Goal: Plan of Care Review  Outcome: Ongoing - Unchanged  Goal: Patient-Specific Goal (Individualized)  Outcome: Ongoing - Unchanged  Goal: Absence of Hospital-Acquired Illness or Injury  Outcome: Ongoing - Unchanged  Intervention: Identify and Manage Fall Risk  Recent Flowsheet Documentation  Taken 03/21/2024 0600 by Jule Ser, RN  Safety Interventions:   bed alarm   lighting adjusted for tasks/safety  Taken 03/21/2024 0400 by Jule Ser, RN  Safety Interventions:   bed alarm   lighting adjusted for tasks/safety  Taken 03/21/2024 0200 by Jule Ser, RN  Safety Interventions:   bed alarm   lighting adjusted for tasks/safety  Taken 03/21/2024 0000 by Jule Ser, RN  Safety Interventions:   bed alarm   lighting adjusted for tasks/safety  Taken 03/20/2024 2200 by Jule Ser, RN  Safety Interventions:   bed alarm   lighting adjusted for tasks/safety  Taken 03/20/2024 2000 by Jule Ser, RN  Safety Interventions:   bed alarm   lighting adjusted for tasks/safety  Intervention: Prevent Skin Injury  Recent Flowsheet Documentation  Taken 03/21/2024 0600 by Jule Ser, RN  Positioning for Skin: Right  Device Skin Pressure Protection:   absorbent pad utilized/changed   adhesive use limited   tubing/devices free from skin contact  Skin Protection:   adhesive use limited   transparent dressing maintained   tubing/devices free from skin contact  Taken 03/21/2024 0400 by Jule Ser, RN  Positioning for Skin: Left  Device Skin Pressure Protection:   absorbent pad utilized/changed   adhesive use limited   tubing/devices free from skin contact  Skin Protection:   adhesive use limited   transparent dressing maintained   tubing/devices free from skin contact  Taken 03/21/2024 0200 by Jule Ser, RN  Positioning for Skin: Right  Device Skin Pressure Protection:   absorbent pad utilized/changed   adhesive use limited   tubing/devices free from skin contact  Skin Protection:   adhesive use limited   transparent dressing maintained   tubing/devices free from skin contact  Taken 03/21/2024 0000 by Jule Ser, RN  Positioning for Skin: Left  Device Skin Pressure Protection:   absorbent pad utilized/changed   adhesive use limited   tubing/devices free from skin contact  Skin Protection:   adhesive use limited   transparent dressing maintained   tubing/devices free from skin contact  Taken 03/20/2024 2200 by Jule Ser, RN  Positioning for Skin: Right  Device Skin Pressure Protection:   absorbent pad utilized/changed   adhesive use limited   tubing/devices free from skin contact  Skin Protection:   adhesive use limited   transparent dressing maintained   tubing/devices free from skin contact  Taken 03/20/2024 2000 by Jule Ser, RN  Positioning for Skin: Left  Device Skin Pressure Protection:   absorbent pad utilized/changed   adhesive use limited   tubing/devices free from skin contact  Skin Protection:   adhesive use limited   transparent dressing maintained   tubing/devices free from skin contact  Intervention: Prevent and Manage VTE (Venous Thromboembolism) Risk  Recent Flowsheet Documentation  Taken 03/21/2024 0600 by Jule Ser, RN  Anti-Embolism Device Type: SCD, Knee  Anti-Embolism Device Status: On  Anti-Embolism Device Location: BLE  Taken 03/21/2024 0400 by Jule Ser, RN  Anti-Embolism Device Type: SCD, Knee  Anti-Embolism Device Status: On  Anti-Embolism Device Location: BLE  Taken 03/21/2024 0200 by Jule Ser, RN  Anti-Embolism Device Type: SCD, Knee  Anti-Embolism Device Status: On  Anti-Embolism Device Location:  BLE  Taken 03/21/2024 0046 by Jule Ser, RN  Anti-Embolism Device Type: SCD, Knee  Anti-Embolism Device Status: On  Anti-Embolism Device Location: BLE  Taken 03/21/2024 0000 by Jule Ser, RN  Anti-Embolism Device Type: SCD, Knee  Anti-Embolism Device Status: On  Anti-Embolism Device Location: BLE  Taken 03/20/2024 2200 by Jule Ser, RN  Anti-Embolism Device Type: SCD, Knee  Anti-Embolism Device Status: On  Anti-Embolism Device Location: BLE  Taken 03/20/2024 2000 by Jule Ser, RN  Anti-Embolism Device Type: SCD, Knee  Anti-Embolism Device Status: On  Anti-Embolism Device Location: BLE  Taken 03/20/2024 1945 by Jule Ser, RN  Anti-Embolism Device Type: SCD, Knee  Anti-Embolism Device Status: On  Anti-Embolism Device Location: BLE  Intervention: Prevent Infection  Recent Flowsheet Documentation  Taken 03/21/2024 0600 by Jule Ser, RN  Infection Prevention:   hand hygiene promoted   personal protective equipment utilized  Taken 03/21/2024 0400 by Jule Ser, RN  Infection Prevention:   hand hygiene promoted   personal protective equipment utilized  Taken 03/21/2024 0200 by Jule Ser, RN  Infection Prevention:   hand hygiene promoted   personal protective equipment utilized  Taken 03/21/2024 0000 by Jule Ser, RN  Infection Prevention:   hand hygiene promoted   personal protective equipment utilized  Taken 03/20/2024 2200 by Jule Ser, RN  Infection Prevention:   hand hygiene promoted   personal protective equipment utilized  Taken 03/20/2024 2000 by Jule Ser, RN  Infection Prevention:   hand hygiene promoted   personal protective equipment utilized  Goal: Optimal Comfort and Wellbeing  Outcome: Ongoing - Unchanged  Goal: Readiness for Transition of Care  Outcome: Ongoing - Unchanged  Goal: Rounds/Family Conference  Outcome: Ongoing - Unchanged     Problem: Infection  Goal: Absence of Infection Signs and Symptoms  Outcome: Ongoing - Unchanged  Intervention: Prevent or Manage Infection  Recent Flowsheet Documentation  Taken 03/21/2024 0600 by Jule Ser, RN  Infection Management: aseptic technique maintained  Isolation Precautions: contact precautions maintained  Taken 03/21/2024 0400 by Jule Ser, RN  Infection Management: aseptic technique maintained  Isolation Precautions: contact precautions maintained  Taken 03/21/2024 0200 by Jule Ser, RN  Infection Management: aseptic technique maintained  Isolation Precautions: contact precautions maintained  Taken 03/21/2024 0000 by Jule Ser, RN  Infection Management: aseptic technique maintained  Isolation Precautions: contact precautions maintained  Taken 03/20/2024 2200 by Jule Ser, RN  Infection Management: aseptic technique maintained  Isolation Precautions: contact precautions maintained  Taken 03/20/2024 2000 by Jule Ser, RN  Infection Management: aseptic technique maintained  Isolation Precautions: contact precautions maintained     Problem: Fall Injury Risk  Goal: Absence of Fall and Fall-Related Injury  Outcome: Ongoing - Unchanged  Intervention: Promote Injury-Free Environment  Recent Flowsheet Documentation  Taken 03/21/2024 0600 by Jule Ser, RN  Safety Interventions:   bed alarm   lighting adjusted for tasks/safety  Taken 03/21/2024 0400 by Jule Ser, RN  Safety Interventions:   bed alarm   lighting adjusted for tasks/safety  Taken 03/21/2024 0200 by Jule Ser, RN  Safety Interventions:   bed alarm   lighting adjusted for tasks/safety  Taken 03/21/2024 0000 by Jule Ser, RN  Safety Interventions:   bed alarm   lighting adjusted for tasks/safety  Taken 03/20/2024 2200 by Jule Ser, RN  Safety Interventions:   bed alarm   lighting adjusted for tasks/safety  Taken 03/20/2024 2000 by Jule Ser, RN  Safety Interventions:   bed alarm   lighting adjusted for tasks/safety  Problem: Wound  Goal: Optimal Coping  Outcome: Ongoing - Unchanged  Goal: Optimal Functional Ability  Outcome: Ongoing - Unchanged  Intervention: Optimize Functional Ability  Recent Flowsheet Documentation  Taken 03/21/2024 0600 by Jule Ser, RN  Activity Management: bedrest  Taken 03/21/2024 0400 by Jule Ser, RN  Activity Management: bedrest  Taken 03/21/2024 0200 by Jule Ser, RN  Activity Management: bedrest  Taken 03/21/2024 0000 by Jule Ser, RN  Activity Management: bedrest  Taken 03/20/2024 2200 by Jule Ser, RN  Activity Management: bedrest  Taken 03/20/2024 2000 by Jule Ser, RN  Activity Management: bedrest  Goal: Absence of Infection Signs and Symptoms  Outcome: Ongoing - Unchanged  Intervention: Prevent or Manage Infection  Recent Flowsheet Documentation  Taken 03/21/2024 0600 by Jule Ser, RN  Infection Management: aseptic technique maintained  Isolation Precautions: contact precautions maintained  Taken 03/21/2024 0400 by Jule Ser, RN  Infection Management: aseptic technique maintained  Isolation Precautions: contact precautions maintained  Taken 03/21/2024 0200 by Jule Ser, RN  Infection Management: aseptic technique maintained  Isolation Precautions: contact precautions maintained  Taken 03/21/2024 0000 by Jule Ser, RN  Infection Management: aseptic technique maintained  Isolation Precautions: contact precautions maintained  Taken 03/20/2024 2200 by Jule Ser, RN  Infection Management: aseptic technique maintained  Isolation Precautions: contact precautions maintained  Taken 03/20/2024 2000 by Jule Ser, RN  Infection Management: aseptic technique maintained  Isolation Precautions: contact precautions maintained  Goal: Improved Oral Intake  Outcome: Ongoing - Unchanged  Goal: Optimal Pain Control and Function  Outcome: Ongoing - Unchanged  Intervention: Prevent or Manage Pain  Recent Flowsheet Documentation  Taken 03/21/2024 0600 by Jule Ser, RN  Sleep/Rest Enhancement: awakenings minimized  Taken 03/21/2024 0400 by Jule Ser, RN  Sleep/Rest Enhancement: awakenings minimized  Taken 03/21/2024 0200 by Jule Ser, RN  Sleep/Rest Enhancement: awakenings minimized  Taken 03/21/2024 0000 by Jule Ser, RN  Sleep/Rest Enhancement: awakenings minimized  Taken 03/20/2024 2200 by Jule Ser, RN  Sleep/Rest Enhancement: awakenings minimized  Taken 03/20/2024 2000 by Jule Ser, RN  Sleep/Rest Enhancement: awakenings minimized  Goal: Skin Health and Integrity  Outcome: Ongoing - Unchanged  Intervention: Optimize Skin Protection  Recent Flowsheet Documentation  Taken 03/21/2024 0600 by Jule Ser, RN  Activity Management: bedrest  Pressure Reduction Techniques:   heels elevated off bed   frequent weight shift encouraged  Head of Bed (HOB) Positioning: HOB at 30-45 degrees  Pressure Reduction Devices: pressure-redistributing mattress utilized  Skin Protection:   adhesive use limited   transparent dressing maintained   tubing/devices free from skin contact  Taken 03/21/2024 0400 by Jule Ser, RN  Activity Management: bedrest  Pressure Reduction Techniques:   heels elevated off bed   frequent weight shift encouraged  Head of Bed (HOB) Positioning: HOB at 30-45 degrees  Pressure Reduction Devices: pressure-redistributing mattress utilized  Skin Protection:   adhesive use limited   transparent dressing maintained   tubing/devices free from skin contact  Taken 03/21/2024 0200 by Jule Ser, RN  Activity Management: bedrest  Pressure Reduction Techniques:   heels elevated off bed   frequent weight shift encouraged  Head of Bed (HOB) Positioning: HOB at 30-45 degrees  Pressure Reduction Devices: pressure-redistributing mattress utilized  Skin Protection:   adhesive use limited   transparent dressing maintained   tubing/devices free from skin contact  Taken 03/21/2024 0000 by Jule Ser, RN  Activity Management: bedrest  Pressure Reduction Techniques:   heels elevated off bed   frequent weight shift encouraged  Head  of Bed (HOB) Positioning: HOB at 30-45 degrees  Pressure Reduction Devices: pressure-redistributing mattress utilized  Skin Protection:   adhesive use limited   transparent dressing maintained   tubing/devices free from skin contact  Taken 03/20/2024 2200 by Jule Ser, RN  Activity Management: bedrest  Pressure Reduction Techniques:   heels elevated off bed   frequent weight shift encouraged  Head of Bed (HOB) Positioning: HOB at 30-45 degrees  Pressure Reduction Devices: pressure-redistributing mattress utilized  Skin Protection:   adhesive use limited   transparent dressing maintained   tubing/devices free from skin contact  Taken 03/20/2024 2000 by Jule Ser, RN  Activity Management: bedrest  Pressure Reduction Techniques:   heels elevated off bed   frequent weight shift encouraged  Head of Bed (HOB) Positioning: HOB at 30-45 degrees  Pressure Reduction Devices: pressure-redistributing mattress utilized  Skin Protection:   adhesive use limited   transparent dressing maintained   tubing/devices free from skin contact  Taken 03/20/2024 1945 by Jule Ser, RN  Head of Bed [facility]) Positioning: HOB at 30-45 degrees  Goal: Optimal Wound Healing  Outcome: Ongoing - Unchanged  Intervention: Promote Wound Healing  Recent Flowsheet Documentation  Taken 03/21/2024 0600 by Jule Ser, RN  Sleep/Rest Enhancement: awakenings minimized  Taken 03/21/2024 0400 by Jule Ser, RN  Sleep/Rest Enhancement: awakenings minimized  Taken 03/21/2024 0200 by Jule Ser, RN  Sleep/Rest Enhancement: awakenings minimized  Taken 03/21/2024 0000 by Jule Ser, RN  Sleep/Rest Enhancement: awakenings minimized  Taken 03/20/2024 2200 by Jule Ser, RN  Sleep/Rest Enhancement: awakenings minimized  Taken 03/20/2024 2000 by Jule Ser, RN  Sleep/Rest Enhancement: awakenings minimized     Problem: Skin Injury Risk Increased  Goal: Skin Health and Integrity  Outcome: Ongoing - Unchanged  Intervention: Optimize Skin Protection  Recent Flowsheet Documentation  Taken 03/21/2024 0600 by Jule Ser, RN  Activity Management: bedrest  Pressure Reduction Techniques:   heels elevated off bed   frequent weight shift encouraged  Head of Bed (HOB) Positioning: HOB at 30-45 degrees  Pressure Reduction Devices: pressure-redistributing mattress utilized  Skin Protection:   adhesive use limited   transparent dressing maintained   tubing/devices free from skin contact  Taken 03/21/2024 0400 by Jule Ser, RN  Activity Management: bedrest  Pressure Reduction Techniques:   heels elevated off bed   frequent weight shift encouraged  Head of Bed (HOB) Positioning: HOB at 30-45 degrees  Pressure Reduction Devices: pressure-redistributing mattress utilized  Skin Protection:   adhesive use limited   transparent dressing maintained   tubing/devices free from skin contact  Taken 03/21/2024 0200 by Jule Ser, RN  Activity Management: bedrest  Pressure Reduction Techniques:   heels elevated off bed   frequent weight shift encouraged  Head of Bed (HOB) Positioning: HOB at 30-45 degrees  Pressure Reduction Devices: pressure-redistributing mattress utilized  Skin Protection:   adhesive use limited   transparent dressing maintained   tubing/devices free from skin contact  Taken 03/21/2024 0000 by Jule Ser, RN  Activity Management: bedrest  Pressure Reduction Techniques:   heels elevated off bed   frequent weight shift encouraged  Head of Bed (HOB) Positioning: HOB at 30-45 degrees  Pressure Reduction Devices: pressure-redistributing mattress utilized  Skin Protection:   adhesive use limited   transparent dressing maintained   tubing/devices free from skin contact  Taken 03/20/2024 2200 by Jule Ser, RN  Activity Management: bedrest  Pressure Reduction Techniques:   heels elevated off bed   frequent weight shift encouraged  Head of  Bed (HOB) Positioning: HOB at 30-45 degrees  Pressure Reduction Devices: pressure-redistributing mattress utilized  Skin Protection:   adhesive use limited   transparent dressing maintained   tubing/devices free from skin contact  Taken 03/20/2024 2000 by Jule Ser, RN  Activity Management: bedrest  Pressure Reduction Techniques:   heels elevated off bed   frequent weight shift encouraged  Head of Bed (HOB) Positioning: HOB at 30-45 degrees  Pressure Reduction Devices: pressure-redistributing mattress utilized  Skin Protection:   adhesive use limited   transparent dressing maintained   tubing/devices free from skin contact  Taken 03/20/2024 1945 by Jule Ser, RN  Head of Bed (HOB) Positioning: HOB at 30-45 degrees     Problem: Self-Care Deficit  Goal: Improved Ability to Complete Activities of Daily Living  Outcome: Ongoing - Unchanged     Problem: Artificial Airway  Goal: Effective Communication  Outcome: Ongoing - Unchanged  Goal: Optimal Device Function  Outcome: Ongoing - Unchanged  Intervention: Optimize Device Care and Function  Recent Flowsheet Documentation  Taken 03/21/2024 0600 by Jule Ser, RN  Aspiration Precautions:   NPO pending swallow screening/evaluation   oral hygiene care promoted  Oral Care:   mouth swabbed   suction provided   teeth brushed   tongue brushed  Taken 03/21/2024 0400 by Jule Ser, RN  Airway/Ventilation Management: airway patency maintained  Aspiration Precautions:   NPO pending swallow screening/evaluation   oral hygiene care promoted  Oral Care:   mouth swabbed   suction provided   teeth brushed   tongue brushed  Taken 03/21/2024 0200 by Jule Ser, RN  Aspiration Precautions:   NPO pending swallow screening/evaluation   oral hygiene care promoted  Oral Care:   mouth swabbed   suction provided   teeth brushed   tongue brushed  Taken 03/21/2024 0046 by Jule Ser, RN  Airway/Ventilation Management: airway patency maintained  Taken 03/21/2024 0000 by Jule Ser, RN  Aspiration Precautions:   NPO pending swallow screening/evaluation   oral hygiene care promoted  Oral Care:   mouth swabbed   suction provided  Taken 03/20/2024 2200 by Jule Ser, RN  Aspiration Precautions:   NPO pending swallow screening/evaluation   oral hygiene care promoted  Oral Care:   mouth swabbed   suction provided  Taken 03/20/2024 2000 by Jule Ser, RN  Aspiration Precautions:   NPO pending swallow screening/evaluation   oral hygiene care promoted  Oral Care:   mouth swabbed   suction provided   teeth brushed   tongue brushed  Taken 03/20/2024 1945 by Jule Ser, RN  Airway/Ventilation Management: airway patency maintained  Goal: Absence of Device-Related Skin or Tissue Injury  Outcome: Ongoing - Unchanged  Intervention: Maintain Skin and Tissue Health  Recent Flowsheet Documentation  Taken 03/21/2024 0600 by Jule Ser, RN  Device Skin Pressure Protection:   absorbent pad utilized/changed   adhesive use limited   tubing/devices free from skin contact  Taken 03/21/2024 0400 by Jule Ser, RN  Device Skin Pressure Protection:   absorbent pad utilized/changed   adhesive use limited   tubing/devices free from skin contact  Taken 03/21/2024 0200 by Jule Ser, RN  Device Skin Pressure Protection:   absorbent pad utilized/changed   adhesive use limited   tubing/devices free from skin contact  Taken 03/21/2024 0000 by Jule Ser, RN  Device Skin Pressure Protection:   absorbent pad utilized/changed   adhesive use limited   tubing/devices free from skin contact  Taken 03/20/2024 2200 by Jule Ser, RN  Device Skin Pressure Protection:   absorbent pad utilized/changed   adhesive use limited   tubing/devices free from skin contact  Taken 03/20/2024 2000 by Jule Ser, RN  Device Skin Pressure Protection:   absorbent pad utilized/changed   adhesive use limited   tubing/devices free from skin contact     Problem: Malnutrition  Goal: Improved Nutritional Intake  Outcome: Ongoing - Unchanged     Problem: Mechanical Ventilation Invasive  Goal: Effective Communication  Outcome: Ongoing - Unchanged  Goal: Optimal Device Function  Outcome: Ongoing - Unchanged  Intervention: Optimize Device Care and Function  Recent Flowsheet Documentation  Taken 03/21/2024 0600 by Jule Ser, RN  Oral Care:   mouth swabbed   suction provided   teeth brushed   tongue brushed  Taken 03/21/2024 0400 by Jule Ser, RN  Airway/Ventilation Management: airway patency maintained  Oral Care:   mouth swabbed   suction provided   teeth brushed   tongue brushed  Taken 03/21/2024 0200 by Jule Ser, RN  Oral Care:   mouth swabbed   suction provided   teeth brushed   tongue brushed  Taken 03/21/2024 0046 by Jule Ser, RN  Airway/Ventilation Management: airway patency maintained  Taken 03/21/2024 0000 by Jule Ser, RN  Oral Care:   mouth swabbed   suction provided  Taken 03/20/2024 2200 by Jule Ser, RN  Oral Care:   mouth swabbed   suction provided  Taken 03/20/2024 2000 by Jule Ser, RN  Oral Care:   mouth swabbed   suction provided   teeth brushed   tongue brushed  Taken 03/20/2024 1945 by Jule Ser, RN  Airway/Ventilation Management: airway patency maintained  Goal: Mechanical Ventilation Liberation  Outcome: Ongoing - Unchanged  Intervention: Promote Extubation and Mechanical Ventilation Liberation  Recent Flowsheet Documentation  Taken 03/21/2024 0600 by Jule Ser, RN  Sleep/Rest Enhancement: awakenings minimized  Taken 03/21/2024 0400 by Jule Ser, RN  Sleep/Rest Enhancement: awakenings minimized  Taken 03/21/2024 0200 by Jule Ser, RN  Sleep/Rest Enhancement: awakenings minimized  Taken 03/21/2024 0000 by Jule Ser, RN  Sleep/Rest Enhancement: awakenings minimized  Taken 03/20/2024 2200 by Jule Ser, RN  Sleep/Rest Enhancement: awakenings minimized  Taken 03/20/2024 2000 by Jule Ser, RN  Sleep/Rest Enhancement: awakenings minimized  Goal: Optimal Nutrition Delivery  Outcome: Ongoing - Unchanged  Goal: Absence of Device-Related Skin and Tissue Injury  Outcome: Ongoing - Unchanged  Intervention: Maintain Skin and Tissue Health  Recent Flowsheet Documentation  Taken 03/21/2024 0600 by Jule Ser, RN  Device Skin Pressure Protection:   absorbent pad utilized/changed   adhesive use limited   tubing/devices free from skin contact  Taken 03/21/2024 0400 by Jule Ser, RN  Device Skin Pressure Protection:   absorbent pad utilized/changed   adhesive use limited   tubing/devices free from skin contact  Taken 03/21/2024 0200 by Jule Ser, RN  Device Skin Pressure Protection:   absorbent pad utilized/changed   adhesive use limited   tubing/devices free from skin contact  Taken 03/21/2024 0000 by Jule Ser, RN  Device Skin Pressure Protection:   absorbent pad utilized/changed   adhesive use limited   tubing/devices free from skin contact  Taken 03/20/2024 2200 by Jule Ser, RN  Device Skin Pressure Protection:   absorbent pad utilized/changed   adhesive use limited   tubing/devices free from skin contact  Taken 03/20/2024 2000 by Jule Ser, RN  Device Skin Pressure Protection:   absorbent pad utilized/changed   adhesive use limited   tubing/devices free from skin contact  Goal: Absence of Ventilator-Induced Lung Injury  Outcome: Ongoing - Unchanged  Intervention: Prevent Ventilator-Associated Pneumonia  Recent Flowsheet Documentation  Taken 03/21/2024 0600 by Jule Ser, RN  Head of Bed [facility]) Positioning: HOB at 30-45 degrees  Oral Care:   mouth swabbed   suction provided   teeth brushed   tongue brushed  Taken 03/21/2024 0400 by Jule Ser, RN  Head of Bed [facility]) Positioning: HOB at 30-45 degrees  Oral Care:   mouth swabbed   suction provided   teeth brushed   tongue brushed  Taken 03/21/2024 0200 by Jule Ser, RN  Head of Bed [facility]) Positioning: HOB at 30-45 degrees  Oral Care:   mouth swabbed   suction provided   teeth brushed   tongue brushed  Taken 03/21/2024 0000 by Jule Ser, RN  Head of Bed [facility]) Positioning: HOB at 30-45 degrees  Oral Care:   mouth swabbed   suction provided  Taken 03/20/2024 2200 by Jule Ser, RN  Head of Bed [facility]) Positioning: HOB at 30-45 degrees  Oral Care:   mouth swabbed   suction provided  Taken 03/20/2024 2000 by Jule Ser, RN  Head of Bed [facility]) Positioning: HOB at 30-45 degrees  Oral Care:   mouth swabbed   suction provided   teeth brushed   tongue brushed  Taken 03/20/2024 1945 by Jule Ser, RN  Head of Bed [facility]) Positioning: HOB at 30-45 degrees

## 2024-03-21 NOTE — Unmapped (Signed)
 Pt stable this shift. Sedation weaned for increased awareness, pt kept comfortable with Fentanyl. Heparin titrated per nomogram. Tube feeds started. Pt diuresed, good UOP. Pt and family updated at bedside. See flowsheet for VS and assessment.    Problem: Adult Inpatient Plan of Care  Goal: Plan of Care Review  Outcome: Ongoing - Unchanged  Goal: Patient-Specific Goal (Individualized)  Outcome: Ongoing - Unchanged  Goal: Absence of Hospital-Acquired Illness or Injury  Outcome: Ongoing - Unchanged  Intervention: Identify and Manage Fall Risk  Recent Flowsheet Documentation  Taken 03/21/2024 0800 by Bobbye Morton, RN  Safety Interventions:   aspiration precautions   bed alarm   lighting adjusted for tasks/safety   low bed   room near unit station   fall reduction program maintained   enteral feeding safety   infection management   bleeding precautions  Intervention: Prevent Skin Injury  Recent Flowsheet Documentation  Taken 03/21/2024 1600 by Bobbye Morton, RN  Positioning for Skin: Right  Taken 03/21/2024 1400 by Bobbye Morton, RN  Positioning for Skin: Left  Taken 03/21/2024 1215 by Bobbye Morton, RN  Positioning for Skin: (for bath) Supine/Back  Taken 03/21/2024 1200 by Bobbye Morton, RN  Positioning for Skin: Left  Taken 03/21/2024 1000 by Bobbye Morton, RN  Positioning for Skin: Right  Taken 03/21/2024 0800 by Bobbye Morton, RN  Positioning for Skin: Left  Device Skin Pressure Protection:   absorbent pad utilized/changed   positioning supports utilized  Skin Protection:   adhesive use limited   incontinence pads utilized   silicone foam dressing in place   tubing/devices free from skin contact  Intervention: Prevent and Manage VTE (Venous Thromboembolism) Risk  Recent Flowsheet Documentation  Taken 03/21/2024 1600 by Bobbye Morton, RN  Anti-Embolism Device Type: SCD, Knee  Anti-Embolism Device Status: On  Anti-Embolism Device Location: BLE  Taken 03/21/2024 1400 by Bobbye Morton, RN  Anti-Embolism Device Type: SCD, Knee  Anti-Embolism Device Status: On  Anti-Embolism Device Location: BLE  Taken 03/21/2024 1200 by Bobbye Morton, RN  Anti-Embolism Device Type: SCD, Knee  Anti-Embolism Device Status: On  Anti-Embolism Device Location: BLE  Taken 03/21/2024 1000 by Bobbye Morton, RN  Anti-Embolism Device Type: SCD, Knee  Anti-Embolism Device Status: On  Anti-Embolism Device Location: BLE  Taken 03/21/2024 0800 by Bobbye Morton, RN  Anti-Embolism Device Type: SCD, Knee  Anti-Embolism Device Status: On  Anti-Embolism Device Location: BLE  Intervention: Prevent Infection  Recent Flowsheet Documentation  Taken 03/21/2024 0800 by Bobbye Morton, RN  Infection Prevention:   environmental surveillance performed   equipment surfaces disinfected   hand hygiene promoted   personal protective equipment utilized   rest/sleep promoted   single patient room provided   visitors restricted/screened  Goal: Optimal Comfort and Wellbeing  Outcome: Ongoing - Unchanged  Goal: Readiness for Transition of Care  Outcome: Ongoing - Unchanged  Goal: Rounds/Family Conference  Outcome: Ongoing - Unchanged     Problem: Infection  Goal: Absence of Infection Signs and Symptoms  Outcome: Ongoing - Unchanged  Intervention: Prevent or Manage Infection  Recent Flowsheet Documentation  Taken 03/21/2024 0800 by Bobbye Morton, RN  Infection Management: aseptic technique maintained     Problem: Fall Injury Risk  Goal: Absence of Fall and Fall-Related Injury  Outcome: Ongoing - Unchanged  Intervention: Promote Injury-Free Environment  Recent Flowsheet Documentation  Taken 03/21/2024 0800 by Bobbye Morton, RN  Safety Interventions:   aspiration precautions   bed alarm   lighting adjusted for tasks/safety   low bed   room  near unit station   fall reduction program maintained   enteral feeding safety   infection management   bleeding precautions     Problem: Wound  Goal: Optimal Coping  Outcome: Ongoing - Unchanged  Goal: Optimal Functional Ability  Outcome: Ongoing - Unchanged  Intervention: Optimize Functional Ability  Recent Flowsheet Documentation  Taken 03/21/2024 0800 by Bobbye Morton, RN  Activity Management: bedrest  Goal: Absence of Infection Signs and Symptoms  Outcome: Ongoing - Unchanged  Intervention: Prevent or Manage Infection  Recent Flowsheet Documentation  Taken 03/21/2024 0800 by Bobbye Morton, RN  Infection Management: aseptic technique maintained  Goal: Improved Oral Intake  Outcome: Ongoing - Unchanged  Goal: Optimal Pain Control and Function  Outcome: Ongoing - Unchanged  Goal: Skin Health and Integrity  Outcome: Ongoing - Unchanged  Intervention: Optimize Skin Protection  Recent Flowsheet Documentation  Taken 03/21/2024 0800 by Bobbye Morton, RN  Activity Management: bedrest  Pressure Reduction Techniques:   frequent weight shift encouraged   pressure points protected   weight shift assistance provided  Head of Bed (HOB) Positioning: HOB at 30-45 degrees  Pressure Reduction Devices:   pressure-redistributing mattress utilized   positioning supports utilized  Skin Protection:   adhesive use limited   incontinence pads utilized   silicone foam dressing in place   tubing/devices free from skin contact  Goal: Optimal Wound Healing  Outcome: Ongoing - Unchanged     Problem: Skin Injury Risk Increased  Goal: Skin Health and Integrity  Outcome: Ongoing - Unchanged  Intervention: Optimize Skin Protection  Recent Flowsheet Documentation  Taken 03/21/2024 0800 by Bobbye Morton, RN  Activity Management: bedrest  Pressure Reduction Techniques:   frequent weight shift encouraged   pressure points protected   weight shift assistance provided  Head of Bed (HOB) Positioning: HOB at 30-45 degrees  Pressure Reduction Devices:   pressure-redistributing mattress utilized   positioning supports utilized  Skin Protection:   adhesive use limited   incontinence pads utilized   silicone foam dressing in place   tubing/devices free from skin contact     Problem: Self-Care Deficit  Goal: Improved Ability to Complete Activities of Daily Living  Outcome: Ongoing - Unchanged     Problem: Artificial Airway  Goal: Effective Communication  Outcome: Ongoing - Unchanged  Goal: Optimal Device Function  Outcome: Ongoing - Unchanged  Intervention: Optimize Device Care and Function  Recent Flowsheet Documentation  Taken 03/21/2024 1600 by Bobbye Morton, RN  Oral Care: suction provided  Taken 03/21/2024 1400 by Bobbye Morton, RN  Oral Care:   suction provided   mouth swabbed   lip/mouth moisturizer applied   oral rinse provided  Taken 03/21/2024 1200 by Bobbye Morton, RN  Oral Care:   suction provided   oral rinse provided   mouth swabbed  Taken 03/21/2024 0943 by Bobbye Morton, RN  Oral Care:   suction provided   mouth swabbed   oral rinse provided   lip/mouth moisturizer applied  Taken 03/21/2024 0800 by Bobbye Morton, RN  Airway/Ventilation Management:   airway patency maintained   calming measures promoted   pulmonary hygiene promoted  Aspiration Precautions:   respiratory status monitored   tube feeding placement verified   upright posture maintained   NPO pending swallow screening/evaluation   oral hygiene care promoted  Taken 03/21/2024 0745 by Bobbye Morton, RN  Oral Care: (done by RT) other (see comments)  Goal: Absence of Device-Related Skin or Tissue Injury  Outcome: Ongoing - Unchanged  Intervention: Maintain Skin  and Tissue Health  Recent Flowsheet Documentation  Taken 03/21/2024 0800 by Bobbye Morton, RN  Device Skin Pressure Protection:   absorbent pad utilized/changed   positioning supports utilized     Problem: Malnutrition  Goal: Improved Nutritional Intake  Outcome: Ongoing - Unchanged     Problem: Mechanical Ventilation Invasive  Goal: Effective Communication  Outcome: Ongoing - Unchanged  Goal: Optimal Device Function  Outcome: Ongoing - Unchanged  Intervention: Optimize Device Care and Function  Recent Flowsheet Documentation  Taken 03/21/2024 1600 by Bobbye Morton, RN  Oral Care: suction provided  Taken 03/21/2024 1400 by Bobbye Morton, RN  Oral Care:   suction provided   mouth swabbed   lip/mouth moisturizer applied   oral rinse provided  Taken 03/21/2024 1200 by Bobbye Morton, RN  Oral Care:   suction provided   oral rinse provided   mouth swabbed  Taken 03/21/2024 0943 by Bobbye Morton, RN  Oral Care:   suction provided   mouth swabbed   oral rinse provided   lip/mouth moisturizer applied  Taken 03/21/2024 0800 by Bobbye Morton, RN  Airway/Ventilation Management:   airway patency maintained   calming measures promoted   pulmonary hygiene promoted  Taken 03/21/2024 0745 by Bobbye Morton, RN  Oral Care: (done by RT) other (see comments)  Goal: Mechanical Ventilation Liberation  Outcome: Ongoing - Unchanged  Goal: Optimal Nutrition Delivery  Outcome: Ongoing - Unchanged  Goal: Absence of Device-Related Skin and Tissue Injury  Outcome: Ongoing - Unchanged  Intervention: Maintain Skin and Tissue Health  Recent Flowsheet Documentation  Taken 03/21/2024 0800 by Bobbye Morton, RN  Device Skin Pressure Protection:   absorbent pad utilized/changed   positioning supports utilized  Goal: Absence of Ventilator-Induced Lung Injury  Outcome: Ongoing - Unchanged  Intervention: Prevent Ventilator-Associated Pneumonia  Recent Flowsheet Documentation  Taken 03/21/2024 1600 by Bobbye Morton, RN  Oral Care: suction provided  Taken 03/21/2024 1400 by Bobbye Morton, RN  Oral Care:   suction provided   mouth swabbed   lip/mouth moisturizer applied   oral rinse provided  Taken 03/21/2024 1200 by Bobbye Morton, RN  Oral Care:   suction provided   oral rinse provided   mouth swabbed  Taken 03/21/2024 0943 by Bobbye Morton, RN  Oral Care:   suction provided   mouth swabbed   oral rinse provided   lip/mouth moisturizer applied  Taken 03/21/2024 0800 by Bobbye Morton, RN  Head of Bed [facility]) Positioning: HOB at 30-45 degrees  Taken 03/21/2024 0745 by Bobbye Morton, RN  Oral Care: (done by RT) other (see comments)

## 2024-03-21 NOTE — Unmapped (Signed)
 [facility]U PROGRESS NOTE     Date of Service: 03/21/2024    Hospital Day: LOS: 23 days        Surgery Date: TBD   Surgical Attending: Joanie Coddington, MD    Critical Care Attending: Lia Hopping, MD    Interval History:     Met NN goal overnight and did not receive more lasix. SBT passed this AM. Discontinuing propofol gtt and weaning fentanyl. Will plan to gently diuresis with lasix 40mg  and NN goal of -  1L. Restarting tube feeds with plans to stop TPN. Goal of extubation tomorrow morning.     History of Present Illness:   Malaysha Arlen is a 83 y.o. female with PMH bladder cancer with cystectomy and ileal conduit, COPD, recent dx of afib, moderate-severe TR, CAD (high calcification score), T2DM presented to [facility] with projectile vomiting and abdominal pain, found to have SBO with transition point at level of ileostomy on CT A/P. On initial presentation, labs remarkable for leukocytosis thought to be iso hemoconcentration, AKI on CKD, mildly elevated venous lactate improved with resuscitation. Admitted to [facility] for worsening hypoxia on HFNC requiring intubation in setting of hiatal hernia. OR on 02/28/24 for ex-lap, reduciton of parastomal hernia, small bowel resection with primary anastomosis and lysis of adhesions.    Hospital Course:  - 02/26/24: Admitted to [facility], floor status, consult to urology for possible ileal conduit revision.   - 02/27/24: Patient level of care escalated to step-down iso increased oxygen needs while in ED.  - 02/28/24: Upgraded to ICU. Intubated at bed-side. OR for ex-lap, reduction of parastomal hernia, small bowel resection with primary anastomosis, lysis of adhesions, abdomen left open with ABThera in place.   - 03/01/24: OR for attempt at abdominal closure. Left open due to desaturations during closure attempt. ABThera in place.  - 03/03/24: RTOR for abdominal closure  -03/06/24: Vas cath placed, CRRT started   -03/09/24: Extubated to HFNC  -03/10/24: CRRT discontinued   - 03/11/24: Re-intubated for hypoxic respiratory failure 2/2 mucus plug   - 03/14/24 Extubated to HFNC   -03/15/24: Vas cath removed   -03/16/24: Placed on BiPap for complete collapse of L lung 2/2 probable mucus plugging  -03/19/24: Re-intubated for mucus plugging. Therapeutic bronch, BAL sent.      ASSESSMENT & PLAN:     Neurologic:  - Pain: tylenol SCH, fent gtt and pushes PRN  - Sedation: stop prop gtt    #schizoaffective disorder  - Monthly prolixin injection (given 3/14)    Cardiovascular:  - MAP goal > 65  - NE gtt, wean as able    #HFpEF  #Pulm HTN  - ECHO 3/12- LVEF 70%, moderately dilated RV with moderate systolic dysfunction, severe pulmonary hypertension.    #Hx of HTN  -holding home amlodipine    #Hx of HLD  - atorvastatin    #Afib/flutter w RVR:   - Prior hx of paroxysmal afib. CHADSvasc = 6, no AC PTA per chart review.      > Holding home diltiazem, question efficacy given likely poor absorption  - Amio via NG tube 200mg  daily   - heparin gtt for [facility]     #Pulmonary embolism  - 3/5 CTA chest: Filling defect in L anterior segmental artery  - heparin gtt     #Acute thrombus right basilic   - Identified 3/3 on BUE duplex  - Supportive care, superficial     Respiratory:  #Acute on chronic hypoxemia  #Hx of COPD  -  Aggressive pulmonary toilet  - Continue Brovana & atrovent nebs  - Continue hypertonic saline nebs for clearance       #Aspiration  #Poor pulmonary clearance   - Re-intubated 3/21 for mucous plugging of L lung and inability to clear secretions  - Passed SBT 3/22, goal is to extubate tomorrow morning    #Pseudomonas Pneumonia   #VAP  - see ID for abx plan    FEN/GI:  F: ML  E: Replete electrolytes PRN  N: NPO,  restart tube feeds  - GI prophylaxis: protonix  - Bowel regimen: miralax and senna BID   - Last BM: 3/21    #S/p ex-lap, reduction of parastomal hernia, small bowel resection with primary anastomosis, lysis of adhesions  - CT 3/13 demonstrating partial small bowel obstruction  - Blake drain#2 removed 3/16  - Blake drain #1 removed 3/17  - WTD dressing changes daily per nursing to midline incision    > midline incision drainage culture pending       #Large hiatal hernia, chronic  - re-eval on CT chest/abd/pelvis 3/13 stable    Renal/Genitourinary:  #AKI on CKD  - Nephrology signed off, HD cath removed  - Lasix 40mg  x1, NN goal - 1L     #H/o bladder cancer s/p radical cystectomy with ileal conduit (2012):  #Parastomal hernia:  - Strict I&Os  - Ok for no foley in stoma at this time per urology    Endocrine:   #hyperglycemia   - Glycemic Control: SSI, continue NPH 15 unit BID    Hematologic:   - Hgb stable  - Monitor CBC daily, transfuse for hgb 7mg /dL  - DVT ppx: heparin gtt for PE    Immunologic/Infectious Disease:  - Afebrile with resolving leukocytosis     - Cultures:   - Blood cultures (3/4): negative, final   - BAL (3/4): Pseudomonas aeruginosa - susceptible to meropenum    - BAL (3/8): Pseudomonas aeruginosa and candida glabrata  -3/12: Susceptibilities show resistance to Meropenum, Cefepime, Ceftaz and Zosyn.   - Bcx (3/11): Negative, final  - BAL (3/12): 600,000+ psuedomonas (susceptibilities: resistant to meropenum)  - Ucx (3/13): no growth  - BAL (3/20): pending    - Antimicrobials:    - Ceftriaxone 2/27- 2/28 - colonized UTI   - Zosyn (3/1 - 3/5) for abdominal contamination  - Zosyn (3/6) for pseudomonas when sensitivities remained pending  - Zerbaxa (3/12-3/21) for 10 day course    Musculoskeletal:   #Deconditioned   - PT/OT consulted     Daily Care Checklist:   - Stress Ulcer Prevention: No  - DVT Prophylaxis: Chemical: Heparin drip  - Daily Awakening: Yes  - Spontaneous Breathing Trial: Yes  - Indication for Central/PICC Line: Yes  Infusions requiring central access, Hemodynamic monitoring, and Inadequate peripheral access, LIJ triple lumen  - Indication for Urinary Catheter: No   - Diagnostic images/reports of past 24hrs reviewed: Yes    Disposition:   - Continue ICU care  - PT/OT consulted:yes     SUBJECTIVE:      Intubated, sedated. Shakes head no to question of pain.      OBJECTIVE:     Physical Exam:  Constitutional: Laying in bed, intubated, lightly sedated  Neurologic: Opens eyes to voice. Nods yes/no appropriately. Moves all 4 extremities to command  Respiratory: intubated and mechanically ventilated   Cardiovascular:  Irregularly irregular rhythm ~110bpm   Gastrointestinal: Soft abdomen, midline incision staples in tact. Urostomy draining clear yellow urine.  Musculoskeletal: Trace BLE/BUE edema.  Skin: Warm and dry     Temp:  [36.2 ??C (97.2 ??F)-37.4 ??C (99.4 ??F)] 37 ??C (98.6 ??F)  Heart Rate:  [106-131] 118  SpO2 Pulse:  [107-135] 118  Resp:  [12-27] 16  BP: (88-133)/(47-87) 133/75  MAP (mmHg):  [59-93] 86  FiO2 (%):  [35 %-40 %] 35 %  SpO2:  [98 %-100 %] 99 %     Recent Laboratory Results:  No results for input(s): SPECTYPEART, PHART, PCO2ART, PO2ART, HCO3ART, BEART, O2SATART in the last 24 hours.          Recent Labs     Units 03/20/24  0426 03/20/24  1641 03/21/24  0356   NA mmol/L 148* 147* 148*   K mmol/L 3.9 4.2 4.2   CL mmol/L 110* 107 111*   CO2 mmol/L 27.0 29.0 28.0   BUN mg/dL 64* 65* 67*   CREATININE mg/dL 1.61* 0.96* 0.45*   GLU mg/dL 409* 811* 914     Lab Results   Component Value Date    BILITOT 0.3 03/15/2024    BILITOT 0.4 03/14/2024    BILIDIR 0.20 03/15/2024    BILIDIR 0.20 03/14/2024    ALT 10 03/15/2024    ALT 11 03/14/2024    AST 13 03/15/2024    AST 13 03/14/2024    GGT 16 09/17/2011    GGT 354 (H) 05/03/2011    ALKPHOS 60 03/15/2024    ALKPHOS 58 03/14/2024    PROT 5.7 03/15/2024    PROT 5.4 (L) 03/14/2024    ALBUMIN 1.9 (L) 03/15/2024    ALBUMIN 1.8 (L) 03/14/2024     Recent Labs     Units 03/20/24  0835 03/20/24  1200 03/20/24  1741 03/21/24  0019   POCGLU mg/dL 782 956 213* 086     Recent Labs     Units 03/19/24  0548 03/20/24  0426 03/21/24  0356   WBC 10*9/L 11.6* 11.8* 12.8*   RBC 10*12/L 2.86* 2.74* 2.67*   HGB g/dL 7.6* 7.4* 7.4*   HCT % 24.5* 23.3* 23.0*   MCV fL 85.7 85.3 85.9   MCH pg 26.7 27.1 27.6   MCHC g/dL 57.8* 46.9* 62.9   RDW % 18.4* 18.2* 17.9*   PLT 10*9/L 268 281 244   MPV fL 9.4 9.9 9.9     No results for input(s): INR, APTT in the last 24 hours.    Invalid input(s): PTPATIENT       Lines & Tubes:   Patient Lines/Drains/Airways Status       Active Peripheral & Central Intravenous Access       Name Placement date Placement time Site Days    CVC Triple Lumen 03/07/24 Non-tunneled Left Internal jugular 03/07/24  0849  Internal jugular  13                     Patient Lines/Drains/Airways Status       Active Wounds       Name Placement date Placement time Site Days    Surgical Site 04/08/18 Shoulder Left 04/08/18  1338  -- 2173    Surgical Site 06/05/22 Eye Left 06/05/22  0928  -- 654    Surgical Site 06/19/22 Eye Right 06/19/22  1024  -- 640    Surgical Site 03/03/24 Abdomen 03/03/24  1013  -- 17    Wound 06/24/20 Other (comment) Buttocks inner right buttock 06/24/20  1010  Buttocks  1365    Wound  03/11/24 Other (comment) Sacrum Mid moisture/friction injury 03/11/24  1200  Sacrum  9    Wound 03/12/24 Other (comment) Groin Right 03/12/24  1500  Groin  8    Wound 03/13/24 Neck Right open area note arount the HD catheter durng the dressing change 03/13/24  0930  Neck  7                     Respiratory/ventilator settings for last 24 hours:   Vent Mode: PSV-CPAP  FiO2 (%): 35 %  PEEP: 5 cm H20  PR SUP: 5 cm H20    Intake/Output last 3 shifts:  I/O last 3 completed shifts:  In: 4078.8 [I.V.:784.3; NG/GT:480; IV Piggyback:848.5]  Out: 0981 [Urine:5125; Emesis/NG output:450]    Daily/Recent Weight:  70.2 kg (154 lb 12.2 oz)    BMI:  Body mass index is 29.26 kg/m??.    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    Chronic kidney disease     Depression, psychotic (CMS-HCC)     Diabetes mellitus (CMS-HCC)     in past    Emphysema of lung (CMS-HCC)     Financial difficulties     Frail elderly     Hearing impairment     Hernia     History of transfusion     Hyperlipidemia     Hypertension     Impaired mobility     Osteoporosis     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/08/2015    Visual impairment      Past Surgical History:   Procedure Laterality Date    ABDOMINAL SURGERY      BLADDER SURGERY      BREAST CYST EXCISION      CHEMOTHERAPY  2012    bladder    GALLBLADDER SURGERY      stone removal    ILEOSTOMY  2012    PR COLONOSCOPY FLX DX W/COLLJ SPEC WHEN PFRMD N/A 02/09/2015    Procedure: COLONOSCOPY, FLEXIBLE, PROXIMAL TO SPLENIC FLEXURE; DIAGNOSTIC, W/WO COLLECTION SPECIMEN BY BRUSH OR WASH;  Surgeon: Dewaine Conger, MD;  Location: [facility] [place];  Service: Gastroenterology    PR EXPLORATORY OF ABDOMEN N/A 02/28/2024    Procedure: EXPLORATORY LAPAROTOMY, EXPLORATORY CELIOTOMY WITH OR WITHOUT BIOPSY(S);  Surgeon: Renda Rolls, MD;  Location[facility];  Service: General Surgery    PR RECONSTR TOTAL SHOULDER IMPLANT Left 04/08/2018    Procedure: ARTHROPLASTY, GLENOHUMERAL JOINT; TOTAL SHOULDER(GLENOID & PROXIMAL HUMERAL REPLACEMENT(EG, TOTAL SHOULDER);  Surgeon: Tomasa Rand, MD;  Location: [facility] OR [facility];  Service: Ortho Sports Medicine    PR REOPEN RECENT ABD EXPLORATORY Midline 03/01/2024    Procedure: REOPENING OF RECENT LAPAROTOMY;  Surgeon: [name]., MD;  Location: OR [facility];  Service: Trauma    PR REOPEN RECENT ABD EXPLORATORY N/A 03/03/2024    Procedure: REOPENING OF RECENT LAPAROTOMY;  Surgeon: Joanie Coddington, MD;  Location: OR [facility];  Service: Trauma    PR SIGMOIDOSCOPY,BIOPSY N/A 03/11/2015    Procedure: SIGMOIDOSCOPY, FLEXIBLE; WITH BIOPSY, SINGLE OR MULTIPLE;  Surgeon: Wilburt Finlay, MD;  [facility];  Service: Gastroenterology    PR XCAPSL CTRC RMVL INSJ IO LENS PROSTH W/O  ECP Left 06/05/2022 Procedure: EXTRACAPSULAR CATARACT REMOVAL W/INSERTION OF INTRAOCULAR LENS PROSTHESIS, MANUAL OR MECHANICAL TECHNIQUE WITHOUT ENDOSCOPIC CYCLOPHOTOCOAGULATION;  Surgeon: Garner Gavel, MD;  Location[facility] OR [facility];  Service: Ophthalmology    PR XCAPSL CTRC RMVL INSJ IO LENS PROSTH W/O ECP Right 06/19/2022    Procedure: EXTRACAPSULAR CATARACT REMOVAL W/INSERTION OF INTRAOCULAR LENS PROSTHESIS, MANUAL OR MECHANICAL TECHNIQUE WITHOUT ENDOSCOPIC CYCLOPHOTOCOAGULATION;  Surgeon: Garner Gavel, MD;  Location: [facility] OR [facility];  Service: Ophthalmology     Scheduled Medications:   acetaminophen  1,000 mg Enteral tube: gastric Q8H SCH    amiodarone  200 mg Enteral tube: gastric Daily    arformoterol  15 mcg Nebulization BID (RT)    aspirin  81 mg Enteral tube: gastric Daily    atorvastatin  40 mg Enteral tube: gastric Daily    carboxymethylcellulose sodium  2 drop Both Eyes TID    esomeprazole  40 mg Enteral tube: gastric daily    flu vacc [id number](3yr up)-PF  0.5 mL Intramuscular During hospitalization    fluPHENAZine decanoate  50 mg Intramuscular Q30 Days    insulin NPH  15 Units Subcutaneous BID AC    insulin regular  0-20 Units Subcutaneous Q6H SCH    ipratropium  500 mcg Nebulization Q6H (RT)    polyethylene glycol  17 g Enteral tube: gastric BID    sennosides  2.5 mL Enteral tube: gastric BID    sodium chloride  10 mL Intravenous Q8H    sodium chloride  10 mL Intravenous Q8H    sodium chloride  10 mL Intravenous Q8H    sodium chloride  4 mL Nebulization Q6H (RT)    sodium ferric gluconate IVPB  125 mg Intravenous Q12H SCH     Continuous Infusions:   fentaNYL citrate (PF) 50 mcg/mL infusion 25 mcg/hr (03/21/24 0103)    heparin 18 Units/kg/hr (03/21/24 0104)    NORepinephrine bitartrate-NS 2 mcg/min (03/20/24 1850)    Parenteral Nutrition (CENTRAL) 45 mL/hr at 03/20/24 2154    propofol 10 mg/mL infusion 10 mcg/kg/min (03/21/24 0104)     PRN Medications:  albuterol, calcium gluconate, calcium gluconate, dextrose in water, fentaNYL (PF) **OR** fentaNYL (PF), heparin (porcine), heparin (porcine), magnesium sulfate in water, melatonin, nicotine polacrilex **OR** nicotine polacrilex, ondansetron, phenol, potassium chloride in water    Gwyndolyn Saxon, MD   Surgical Intensive Care Unit  [facility] [facility] [place] [facility]

## 2024-03-21 NOTE — Unmapped (Signed)
 Pt remains on vent. Settings have not changed today.ett remains secure with no skin breakdown noted around airway. Will continue to monitor.

## 2024-03-22 LAB — BASIC METABOLIC PANEL
ANION GAP: 10 mmol/L (ref 5–14)
ANION GAP: 9 mmol/L (ref 5–14)
BLOOD UREA NITROGEN: 68 mg/dL — ABNORMAL HIGH (ref 9–23)
BLOOD UREA NITROGEN: 67 mg/dL — ABNORMAL HIGH (ref 9–23)
BUN / CREAT RATIO: 56
BUN / CREAT RATIO: 54
CALCIUM: 9.5 mg/dL (ref 8.7–10.4)
CALCIUM: 9.4 mg/dL (ref 8.7–10.4)
CHLORIDE: 109 mmol/L — ABNORMAL HIGH (ref 98–107)
CHLORIDE: 110 mmol/L — ABNORMAL HIGH (ref 98–107)
CO2: 30 mmol/L (ref 20.0–31.0)
CO2: 32 mmol/L — ABNORMAL HIGH (ref 20.0–31.0)
CREATININE: 1.21 mg/dL — ABNORMAL HIGH (ref 0.55–1.02)
CREATININE: 1.25 mg/dL — ABNORMAL HIGH (ref 0.55–1.02)
EGFR CKD-EPI (2021) FEMALE: 45 mL/min/{1.73_m2} — ABNORMAL LOW (ref >=60)
EGFR CKD-EPI (2021) FEMALE: 43 mL/min/{1.73_m2} — ABNORMAL LOW (ref >=60)
GLUCOSE RANDOM: 154 mg/dL (ref 70–179)
GLUCOSE RANDOM: 119 mg/dL (ref 70–179)
POTASSIUM: 4 mmol/L (ref 3.4–4.8)
POTASSIUM: 4.1 mmol/L (ref 3.4–4.8)
SODIUM: 149 mmol/L — ABNORMAL HIGH (ref 135–145)
SODIUM: 151 mmol/L — ABNORMAL HIGH (ref 135–145)

## 2024-03-22 LAB — CBC
HEMATOCRIT: 24 % — ABNORMAL LOW (ref 34.0–44.0)
HEMOGLOBIN: 7.6 g/dL — ABNORMAL LOW (ref 11.3–14.9)
MEAN CORPUSCULAR HEMOGLOBIN CONC: 31.9 g/dL — ABNORMAL LOW (ref 32.0–36.0)
MEAN CORPUSCULAR HEMOGLOBIN: 27.5 pg (ref 25.9–32.4)
MEAN CORPUSCULAR VOLUME: 86.1 fL (ref 77.6–95.7)
MEAN PLATELET VOLUME: 9.9 fL (ref 6.8–10.7)
PLATELET COUNT: 268 10*9/L (ref 150–450)
RED BLOOD CELL COUNT: 2.78 10*12/L — ABNORMAL LOW (ref 3.95–5.13)
RED CELL DISTRIBUTION WIDTH: 18.2 % — ABNORMAL HIGH (ref 12.2–15.2)
WBC ADJUSTED: 11.5 10*9/L — ABNORMAL HIGH (ref 3.6–11.2)

## 2024-03-22 LAB — HEPATIC FUNCTION PANEL
ALBUMIN: 2 g/dL — ABNORMAL LOW (ref 3.4–5.0)
ALKALINE PHOSPHATASE: 70 U/L (ref 46–116)
ALT (SGPT): <7 U/L — ABNORMAL LOW (ref 10–49)
AST (SGOT): 9 U/L (ref <=34)
BILIRUBIN DIRECT: 0.1 mg/dL (ref 0.00–0.30)
BILIRUBIN TOTAL: 0.3 mg/dL (ref 0.3–1.2)
PROTEIN TOTAL: 6.5 g/dL (ref 5.7–8.2)

## 2024-03-22 LAB — MAGNESIUM
MAGNESIUM: 2.2 mg/dL (ref 1.6–2.6)
MAGNESIUM: 2.1 mg/dL (ref 1.6–2.6)

## 2024-03-22 LAB — APTT
APTT: 80.2 s — ABNORMAL HIGH (ref 24.8–38.4)
HEPARIN CORRELATION: 0.5

## 2024-03-22 LAB — PHOSPHORUS
PHOSPHORUS: 4.4 mg/dL (ref 2.4–5.1)
PHOSPHORUS: 3.9 mg/dL (ref 2.4–5.1)

## 2024-03-22 LAB — TRIGLYCERIDES: TRIGLYCERIDES: 66 mg/dL (ref 0–150)

## 2024-03-22 MED ADMIN — sodium chloride (NS) 0.9 % flush 10 mL: 10 mL | INTRAVENOUS | @ 02:00:00

## 2024-03-22 MED ADMIN — heparin 25,000 Units/250 mL (100 units/mL) in 0.45% saline infusion (premade): 0-24 [IU]/kg/h | INTRAVENOUS | @ 07:00:00

## 2024-03-22 MED ADMIN — esomeprazole (NEXIUM) granules 40 mg: 40 mg | GASTROENTERAL | @ 09:00:00

## 2024-03-22 MED ADMIN — polyethylene glycol (MIRALAX) packet 17 g: 17 g | GASTROENTERAL | @ 14:00:00

## 2024-03-22 MED ADMIN — aspirin chewable tablet 81 mg: 81 mg | GASTROENTERAL | @ 14:00:00

## 2024-03-22 MED ADMIN — sodium chloride (NS) 0.9 % flush 10 mL: 10 mL | INTRAVENOUS | @ 09:00:00

## 2024-03-22 MED ADMIN — sodium chloride 3 % NEBULIZER solution 4 mL: 4 mL | RESPIRATORY_TRACT | @ 11:00:00

## 2024-03-22 MED ADMIN — methocarbamol (ROBAXIN) tablet 500 mg: 500 mg | GASTROENTERAL | @ 22:00:00

## 2024-03-22 MED ADMIN — ipratropium (ATROVENT) 0.02 % nebulizer solution 500 mcg: 500 ug | RESPIRATORY_TRACT | @ 02:00:00

## 2024-03-22 MED ADMIN — acetaminophen (TYLENOL) tablet 1,000 mg: 1000 mg | GASTROENTERAL | @ 02:00:00

## 2024-03-22 MED ADMIN — acetaminophen (TYLENOL) tablet 1,000 mg: 1000 mg | GASTROENTERAL | @ 09:00:00

## 2024-03-22 MED ADMIN — acetaminophen (TYLENOL) tablet 1,000 mg: 1000 mg | GASTROENTERAL | @ 17:00:00

## 2024-03-22 MED ADMIN — sennosides (SENOKOT) oral syrup: 2.5 mL | GASTROENTERAL | @ 14:00:00

## 2024-03-22 MED ADMIN — polyethylene glycol (MIRALAX) packet 17 g: 17 g | GASTROENTERAL | @ 02:00:00

## 2024-03-22 MED ADMIN — ipratropium (ATROVENT) 0.02 % nebulizer solution 500 mcg: 500 ug | RESPIRATORY_TRACT | @ 11:00:00

## 2024-03-22 MED ADMIN — sodium chloride 3 % NEBULIZER solution 4 mL: 4 mL | RESPIRATORY_TRACT | @ 02:00:00

## 2024-03-22 MED ADMIN — amiodarone (PACERONE) tablet 200 mg: 200 mg | GASTROENTERAL | @ 14:00:00

## 2024-03-22 MED ADMIN — carboxymethylcellulose sodium (THERATEARS) 0.25 % ophthalmic solution 2 drop: 2 [drp] | OPHTHALMIC | @ 18:00:00

## 2024-03-22 MED ADMIN — carboxymethylcellulose sodium (THERATEARS) 0.25 % ophthalmic solution 2 drop: 2 [drp] | OPHTHALMIC | @ 14:00:00

## 2024-03-22 MED ADMIN — methocarbamol (ROBAXIN) tablet 500 mg: 500 mg | GASTROENTERAL | @ 09:00:00

## 2024-03-22 MED ADMIN — carboxymethylcellulose sodium (THERATEARS) 0.25 % ophthalmic solution 2 drop: 2 [drp] | OPHTHALMIC | @ 02:00:00

## 2024-03-22 MED ADMIN — sodium ferric gluconate (FERRLECIT) 125 mg in sodium chloride (NS) 0.9 % 100 mL IVPB: 125 mg | INTRAVENOUS | @ 02:00:00 | Stop: 2024-03-21

## 2024-03-22 MED ADMIN — arformoterol (BROVANA) nebulizer solution 15 mcg/2 mL: 15 ug | RESPIRATORY_TRACT | @ 02:00:00

## 2024-03-22 MED ADMIN — melatonin tablet 3 mg: 3 mg | GASTROENTERAL

## 2024-03-22 MED ADMIN — sennosides (SENOKOT) oral syrup: 2.5 mL | GASTROENTERAL | @ 02:00:00

## 2024-03-22 MED ADMIN — furosemide (LASIX) injection 40 mg: 40 mg | INTRAVENOUS | @ 14:00:00 | Stop: 2024-03-22

## 2024-03-22 MED ADMIN — methocarbamol (ROBAXIN) tablet 500 mg: 500 mg | GASTROENTERAL | @ 17:00:00

## 2024-03-22 MED ADMIN — methocarbamol (ROBAXIN) tablet 500 mg: 500 mg | GASTROENTERAL | @ 02:00:00

## 2024-03-22 MED ADMIN — atorvastatin (LIPITOR) tablet 40 mg: 40 mg | GASTROENTERAL | @ 14:00:00

## 2024-03-22 NOTE — Unmapped (Signed)
 coRT Extubation    Order found in EPIC: Yes    Cuff leak: Yes    RSBI: 48    Can the patient lift their head off the pillow: Yes  Can the patient squeeze your hand: Yes    SBT completed on:     PS 5  PEEP 5  FiO2 35    ABG: No results for input(s): SPECTYPEART, PHART, PCO2ART, PO2ART, HCO3ART, BEART, O2SATART in the last 24 hours.     Patient extubated at 1410    Skin condition: Clear; Good    Breath sounds post extubation: Diminished and Rhonchi  Stridor present: No  Increased WOB post extubation: No    Current oxygen device: HHFNC  Liters/Min: 60      Additional comments:

## 2024-03-22 NOTE — Unmapped (Signed)
 VENOUS ACCESS TEAM PROCEDURE    Nurse request was placed for a PIV by Venous Access Team (VAT).  Patient was assessed at bedside for placement of a PIV. PPE were donned per protocol.  Access was obtained. Blood return noted.  Dressing intact and device well secured.  Flushed with normal saline.  See LDA for details.  Pt advised to inform RN of any s/s of discomfort at the PIV site.    Workup / Procedure Time:  30 minutes       Care RN was notified.       Thank you,     Cristal Deer, RN Venous Access Team

## 2024-03-22 NOTE — Unmapped (Addendum)
 [facility]U PROGRESS NOTE     Date of Service: 03/22/2024    Hospital Day: LOS: 24 days        Surgery Date: TBD   Surgical Attending: Joanie Coddington, MD    Critical Care Attending: Lia Hopping, MD    Interval History:   NGT malfunction with leaking. Removed and CorPak placed for gastric access, confirmed placement on KUB. Resume TF's.    Additional PIV placed by nursing staff. NE@4  overnight. Will attempt to wean NE and discont LIJ CVC ASAP.    Continues on HF-[place] 60L/40%, satting well above goal.    History of Present Illness:   Tammy Braun is a 83 y.o. female with PMH bladder cancer with cystectomy and ileal conduit, COPD, recent dx of afib, moderate-severe TR, CAD (high calcification score), T2DM presented to [facility] with projectile vomiting and abdominal pain, found to have SBO with transition point at level of ileostomy on CT A/P. On initial presentation, labs remarkable for leukocytosis thought to be iso hemoconcentration, AKI on CKD, mildly elevated venous lactate improved with resuscitation. Admitted to [facility] for worsening hypoxia on HFNC requiring intubation in setting of hiatal hernia. OR on 02/28/24 for ex-lap, reduciton of parastomal hernia, small bowel resection with primary anastomosis and lysis of adhesions.    Hospital Course:  - 02/26/24: Admitted to [facility], floor status, consult to urology for possible ileal conduit revision.   - 02/27/24: Patient level of care escalated to step-down iso increased oxygen needs while in ED.  - 02/28/24: Upgraded to ICU. Intubated at bed-side. OR for ex-lap, reduction of parastomal hernia, small bowel resection with primary anastomosis, lysis of adhesions, abdomen left open with ABThera in place.   - 03/01/24: OR for attempt at abdominal closure. Left open due to desaturations during closure attempt. ABThera in place.  - 03/03/24: RTOR for abdominal closure  -03/06/24: Vas cath placed, CRRT started   -03/09/24: Extubated to HFNC  -03/10/24: CRRT discontinued   - 03/11/24: Re-intubated for hypoxic respiratory failure 2/2 mucus plug   - 03/14/24 Extubated to HFNC   -03/15/24: Vas cath removed   -03/16/24: Placed on BiPap for complete collapse of L lung 2/2 probable mucus plugging  -03/19/24: Re-intubated for mucus plugging. Therapeutic bronch, BAL sent.      ASSESSMENT & PLAN:     Neurologic:  - Pain: tylenol SCH, fent gtt and pushes PRN  - Sedation: none    #schizoaffective disorder  - Monthly prolixin injection (given 3/14)    Cardiovascular:  - MAP goal > 65  - NE gtt, wean as able    #HFpEF  #Pulm HTN  - ECHO 3/12- LVEF 70%, moderately dilated RV with moderate systolic dysfunction, severe pulmonary hypertension.    #Hx of HTN  -holding home amlodipine    #Hx of HLD  - atorvastatin    #Afib/flutter w RVR:   - Prior hx of paroxysmal afib. CHADSvasc = 6, no AC PTA per chart review.      > Holding home diltiazem, question efficacy given likely poor absorption  - Amio via NG tube 200mg  daily   - heparin gtt for [facility]     #Pulmonary embolism  - 3/5 CTA chest: Filling defect in L anterior segmental artery  - heparin gtt     #Acute thrombus right basilic   - Identified 3/3 on BUE duplex  - Supportive care, superficial     Respiratory:  #Acute on chronic hypoxemia  #Hx of COPD  - Aggressive pulmonary toilet  -  Continue Brovana & atrovent nebs  - Continue hypertonic saline nebs for clearance     #Aspiration  #Poor pulmonary clearance   - Re-intubated 3/21 for mucous plugging of L lung and inability to clear secretions  - Extubated 3/23 (see tx plan note 3/23)    #Pseudomonas Pneumonia, resolved  #VAP  - Completed antibiotics course    FEN/GI:  F: ML  E: Replete electrolytes PRN  N: NPO, TF paused in anticipation of extubation, restart this afternoon  - GI prophylaxis: protonix  - Bowel regimen: miralax and senna BID   - Last BM: 3/21    #Hypernatremia  - Increase FWF to 50mL q1h     #S/p ex-lap, reduction of parastomal hernia, small bowel resection with primary anastomosis, lysis of adhesions  - CT 3/13 demonstrating partial small bowel obstruction  - Blake drain #2 removed 3/16, Blake drain #1 removed 3/17  - WTD dressing changes daily per nursing to midline incision    > midline incision drainage culture with 1+ candida     #Large hiatal hernia, chronic  - re-eval on CT chest/abd/pelvis 3/13 stable    Renal/Genitourinary:  #AKI on CKD  - Nephrology signed off, HD cath removed  - Lasix 40mg  x1, NN goal - 1L    > reassess in PM     #H/o bladder cancer s/p radical cystectomy with ileal conduit (2012):  #Parastomal hernia:  - Strict I&Os  - Ok for no foley in stoma at this time per urology    Endocrine:   #hyperglycemia   - Glycemic Control: SSI, continue NPH 15 unit BID    Hematologic:   - Hgb stable  - Monitor CBC daily, transfuse for hgb 7mg /dL  - DVT ppx: heparin gtt for PE    Immunologic/Infectious Disease:  - Afebrile with resolving leukocytosis     - Cultures:   - Blood cultures (3/4): negative, final   - BAL (3/4): Pseudomonas aeruginosa - susceptible to meropenum    - BAL (3/8): Pseudomonas aeruginosa and candida glabrata  -3/12: Susceptibilities show resistance to Meropenum, Cefepime, Ceftaz and Zosyn.   - Bcx (3/11): Negative, final  - BAL (3/12): 600,000+ psuedomonas (susceptibilities: resistant to meropenum)  - Ucx (3/13): no growth  - BAL (3/20): <10,000 CFU    - Antimicrobials:    - Ceftriaxone 2/27- 2/28 - colonized UTI   - Zosyn (3/1 - 3/5) for abdominal contamination  - Zosyn (3/6) for pseudomonas when sensitivities remained pending  - Zerbaxa (3/12-3/21) for 10 day course    Musculoskeletal:   #Deconditioned   - PT/OT consulted     Daily Care Checklist:   - Stress Ulcer Prevention: No  - DVT Prophylaxis: Chemical: Heparin drip  - Daily Awakening: N/A  - Spontaneous Breathing Trial: N/A  - Indication for Central/PICC Line: Yes  Infusions requiring central access, Hemodynamic monitoring, and Inadequate peripheral access  - Indication for Urinary Catheter: No   - Diagnostic images/reports of past 24hrs reviewed: Yes    Disposition:   - Continue ICU care  - PT/OT consulted:yes      Gurleen Larrivee is critically ill due to acute respiratory failure, hypoxemia, fluid overload, COPD, SBO, prolonged NPO, malnutrition, ileostomy, parastomal hernia, small bowel resection. This critical care time includes examining the patient, evaluating the hemodynamic, laboratory, and radiographic data, independently developing a comprehensive management plan, and serially assessing the patient's response to these critical care interventions. This critical care time excludes procedures.    Critical care time:  45 minutes     Lajean Saver, PA   Surgical Intensive Care Unit  [place] [facility] [place] [facility]                 SUBJECTIVE:      Asking for swab to moisten mouth, agreeable to have NGT replaced.     OBJECTIVE:     Physical Exam:  Constitutional: Laying in bed, chronically ill appearing  Neurologic: AAOX2 self/place, extremities to command, non-focal  Respiratory: nWOB on HFNC, satting upper 90's  Cardiovascular:  Irregularly irregular rhythm   Gastrointestinal: Soft abdomen, midline incision staples in tact, WTD packing in place. Urostomy draining clear yellow urine.   Musculoskeletal: Trace BLE/BUE edema.  Skin: Warm and dry     Temp:  [36.6 ??C (97.9 ??F)-37.1 ??C (98.8 ??F)] 37.1 ??C (98.8 ??F)  Heart Rate:  [106-129] 113  SpO2 Pulse:  [79-129] 113  Resp:  [9-20] 15  BP: (84-122)/(42-79) 84/52  MAP (mmHg):  [58-87] 58  FiO2 (%):  [35 %-40 %] 35 %  SpO2:  [90 %-100 %] 100 %     Recent Laboratory Results:  No results for input(s): SPECTYPEART, PHART, PCO2ART, PO2ART, HCO3ART, BEART, O2SATART in the last 24 hours.    Recent Labs     Units 03/21/24  0356 03/21/24  1436 03/22/24  0444   NA mmol/L 148* 148* 149*   K mmol/L 4.2 4.0 4.0   CL mmol/L 111* 110* 109*   CO2 mmol/L 28.0 28.0 30.0   BUN mg/dL 67* 72* 68*   CREATININE mg/dL 1.61* 0.96* 0.45*   GLU mg/dL 409 811 914     Lab Results Component Value Date    BILITOT 0.3 03/22/2024    BILITOT 0.3 03/15/2024    BILIDIR 0.10 03/22/2024    BILIDIR 0.20 03/15/2024    ALT <7 (L) 03/22/2024    ALT 10 03/15/2024    AST 9 03/22/2024    AST 13 03/15/2024    GGT 16 09/17/2011    GGT 354 (H) 05/03/2011    ALKPHOS 70 03/22/2024    ALKPHOS 60 03/15/2024    PROT 6.5 03/22/2024    PROT 5.7 03/15/2024    ALBUMIN 2.0 (L) 03/22/2024    ALBUMIN 1.9 (L) 03/15/2024     Recent Labs     Units 03/22/24  0007 03/22/24  0625 03/22/24  1255 03/22/24  1804   POCGLU mg/dL 782 956 213 086     Recent Labs     Units 03/20/24  0426 03/21/24  0356 03/22/24  0444   WBC 10*9/L 11.8* 12.8* 11.5*   RBC 10*12/L 2.74* 2.67* 2.78*   HGB g/dL 7.4* 7.4* 7.6*   HCT % 23.3* 23.0* 24.0*   MCV fL 85.3 85.9 86.1   MCH pg 27.1 27.6 27.5   MCHC g/dL 57.8* 46.9 62.9*   RDW % 18.2* 17.9* 18.2*   PLT 10*9/L 281 244 268   MPV fL 9.9 9.9 9.9     Recent Labs     Units 03/21/24  2209 03/22/24  0444   APTT sec 72.3* 80.2*          Lines & Tubes:   Patient Lines/Drains/Airways Status       Active Peripheral & Central Intravenous Access       Name Placement date Placement time Site Days    Peripheral IV 03/22/24 Left;Posterior Hand 03/22/24  1556  Hand  less than 1    Peripheral IV 03/22/24 Anterior;Right  Forearm 03/22/24  1558  Forearm  less than 1    CVC Triple Lumen 03/07/24 Non-tunneled Left Internal jugular 03/07/24  0849  Internal jugular  15                     Patient Lines/Drains/Airways Status       Active Wounds       Name Placement date Placement time Site Days    Surgical Site 04/08/18 Shoulder Left 04/08/18  1338  -- 2175    Surgical Site 06/05/22 Eye Left 06/05/22  0928  -- 656    Surgical Site 06/19/22 Eye Right 06/19/22  1024  -- 642    Surgical Site 03/03/24 Abdomen 03/03/24  1013  -- 19    Wound 06/24/20 Other (comment) Buttocks inner right buttock 06/24/20  1010  Buttocks  1367    Wound 03/11/24 Other (comment) Sacrum Mid moisture/friction injury 03/11/24  1200  Sacrum  11    Wound 03/13/24 Neck Right open area note arount the HD catheter durng the dressing change 03/13/24  0930  Neck  9                     Respiratory/ventilator settings for last 24 hours:   Vent Mode: PSV-CPAP  FiO2 (%): 35 %  S RR: 15  S VT: 400 mL  PEEP: 5 cm H20  PR SUP: 5 cm H20    Intake/Output last 3 shifts:  I/O last 3 completed shifts:  In: 2542.8 [I.V.:667.8; NG/GT:530; IV Piggyback:100]  Out: 3700 [Urine:3500; Emesis/NG output:200]    Daily/Recent Weight:  68.8 kg (151 lb 10.8 oz)    BMI:  Body mass index is 28.67 kg/m??.    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    Chronic kidney disease     Depression, psychotic (CMS-HCC)     Diabetes mellitus (CMS-HCC)     in past    Emphysema of lung (CMS-HCC)     Financial difficulties     Frail elderly     Hearing impairment     Hernia     History of transfusion     Hyperlipidemia     Hypertension     Impaired mobility     Osteoporosis     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/08/2015    Visual impairment      Past Surgical History:   Procedure Laterality Date    ABDOMINAL SURGERY      BLADDER SURGERY      BREAST CYST EXCISION      CHEMOTHERAPY  2012    bladder    GALLBLADDER SURGERY      stone removal    ILEOSTOMY  2012    PR COLONOSCOPY FLX DX W/COLLJ SPEC WHEN PFRMD N/A 02/09/2015    Procedure: COLONOSCOPY, FLEXIBLE, PROXIMAL TO SPLENIC FLEXURE; DIAGNOSTIC, W/WO COLLECTION SPECIMEN BY BRUSH OR WASH;  Surgeon: Dewaine Conger, MD;  Location: [facility] [place];  Service: Gastroenterology    PR EXPLORATORY OF ABDOMEN N/A 02/28/2024    Procedure: EXPLORATORY LAPAROTOMY, EXPLORATORY CELIOTOMY WITH OR WITHOUT BIOPSY(S);  Surgeon: Renda Rolls, MD;  Location: [facility];  Service: General Surgery    PR RECONSTR TOTAL SHOULDER  IMPLANT Left 04/08/2018    Procedure: ARTHROPLASTY, GLENOHUMERAL JOINT; TOTAL SHOULDER(GLENOID & PROXIMAL HUMERAL REPLACEMENT(EG, TOTAL SHOULDER);  Surgeon: Tomasa Rand, MD;  Location: [facility] OR [facility];  Service: Ortho Sports Medicine    PR REOPEN RECENT ABD EXPLORATORY Midline 03/01/2024    Procedure: REOPENING OF RECENT LAPAROTOMY;  Surgeon: [name]., MD;  Location: OR [facility];  Service: Trauma    PR REOPEN RECENT ABD EXPLORATORY N/A 03/03/2024    Procedure: REOPENING OF RECENT LAPAROTOMY;  Surgeon: Joanie Coddington, MD;  Location: OR [facility];  Service: Trauma    PR SIGMOIDOSCOPY,BIOPSY N/A 03/11/2015    Procedure: SIGMOIDOSCOPY, FLEXIBLE; WITH BIOPSY, SINGLE OR MULTIPLE;  Surgeon: Wilburt Finlay, MD;  Location: [facility];  Service: Gastroenterology    PR XCAPSL CTRC RMVL INSJ IO LENS PROSTH W/O ECP Left 06/05/2022    Procedure: EXTRACAPSULAR CATARACT REMOVAL W/INSERTION OF INTRAOCULAR LENS PROSTHESIS, MANUAL OR MECHANICAL TECHNIQUE WITHOUT ENDOSCOPIC CYCLOPHOTOCOAGULATION;  Surgeon: Garner Gavel, MD;  Location: [facility] OR [facility];  Service: Ophthalmology    PR XCAPSL CTRC RMVL INSJ IO LENS PROSTH W/O ECP Right 06/19/2022    Procedure: EXTRACAPSULAR CATARACT REMOVAL W/INSERTION OF INTRAOCULAR LENS PROSTHESIS, MANUAL OR MECHANICAL TECHNIQUE WITHOUT ENDOSCOPIC CYCLOPHOTOCOAGULATION;  Surgeon: Garner Gavel, MD;  Location: [facility] OR [facility];  Service: Ophthalmology     Scheduled Medications:   acetaminophen  1,000 mg Enteral tube: gastric Q8H SCH    amiodarone  200 mg Enteral tube: gastric Daily    arformoterol  15 mcg Nebulization BID (RT)    aspirin  81 mg Enteral tube: gastric Daily    atorvastatin  40 mg Enteral tube: gastric Daily    carboxymethylcellulose sodium  2 drop Both Eyes TID    esomeprazole  40 mg Enteral tube: gastric daily    flu vacc [id number](44yr up)-PF  0.5 mL Intramuscular During hospitalization    fluPHENAZine decanoate  50 mg Intramuscular Q30 Days    insulin NPH  15 Units Subcutaneous BID AC    insulin regular  0-20 Units Subcutaneous Q6H SCH    ipratropium  500 mcg Nebulization Q6H (RT)    methocarbamol  500 mg Enteral tube: gastric QID    polyethylene glycol  17 g Enteral tube: gastric BID    sennosides  2.5 mL Enteral tube: gastric BID    sodium chloride  10 mL Intravenous Q8H    sodium chloride  10 mL Intravenous Q8H    sodium chloride  10 mL Intravenous Q8H    sodium chloride  4 mL Nebulization Q6H (RT)     Continuous Infusions:   fentaNYL citrate (PF) 50 mcg/mL infusion Stopped (03/22/24 1405)    heparin 16 Units/kg/hr (03/22/24 1000)    NORepinephrine bitartrate-NS 1 mcg/min (03/22/24 1634)     PRN Medications:  albuterol, calcium gluconate, calcium gluconate, dextrose in water, fentaNYL (PF) **OR** fentaNYL (PF), heparin (porcine), heparin (porcine), magnesium sulfate in water, melatonin, nicotine polacrilex **OR** nicotine polacrilex, ondansetron, phenol, potassium chloride in water

## 2024-03-22 NOTE — Unmapped (Deleted)
 Trach size:6 XLT  O2 therapy:Vent  BBS:Dim  Secretions:thin clear/white      Trach care completed, airway remains patent, secure, and no breakdown noted. Emergency equipment at the bedside.

## 2024-03-22 NOTE — Unmapped (Signed)
 Problem: Adult Inpatient Plan of Care  Goal: Plan of Care Review  Outcome: Ongoing - Unchanged  Goal: Patient-Specific Goal (Individualized)  Outcome: Ongoing - Unchanged  Goal: Absence of Hospital-Acquired Illness or Injury  Outcome: Ongoing - Unchanged  Intervention: Identify and Manage Fall Risk  Recent Flowsheet Documentation  Taken 03/22/2024 0425 by Jule Ser, RN  Safety Interventions:   bed alarm   lighting adjusted for tasks/safety  Taken 03/22/2024 0225 by Jule Ser, RN  Safety Interventions:   bed alarm   lighting adjusted for tasks/safety  Taken 03/22/2024 0025 by Jule Ser, RN  Safety Interventions:   bed alarm   lighting adjusted for tasks/safety  Taken 03/21/2024 2201 by Jule Ser, RN  Safety Interventions:   bed alarm   lighting adjusted for tasks/safety  Taken 03/21/2024 2000 by Jule Ser, RN  Safety Interventions:   bed alarm   lighting adjusted for tasks/safety  Intervention: Prevent Skin Injury  Recent Flowsheet Documentation  Taken 03/22/2024 0426 by Jule Ser, RN  Positioning for Skin: Right  Device Skin Pressure Protection:   absorbent pad utilized/changed   adhesive use limited   tubing/devices free from skin contact  Skin Protection:   adhesive use limited   transparent dressing maintained   tubing/devices free from skin contact  Taken 03/22/2024 0425 by Jule Ser, RN  Positioning for Skin: Right  Device Skin Pressure Protection:   absorbent pad utilized/changed   adhesive use limited   tubing/devices free from skin contact  Skin Protection:   adhesive use limited   transparent dressing maintained   tubing/devices free from skin contact  Taken 03/22/2024 0225 by Jule Ser, RN  Positioning for Skin: Left  Device Skin Pressure Protection:   absorbent pad utilized/changed   adhesive use limited   tubing/devices free from skin contact  Skin Protection:   adhesive use limited   transparent dressing maintained   tubing/devices free from skin contact  Taken 03/22/2024 0025 by Jule Ser, RN  Positioning for Skin: Right  Device Skin Pressure Protection:   absorbent pad utilized/changed   adhesive use limited   tubing/devices free from skin contact  Skin Protection:   adhesive use limited   transparent dressing maintained   tubing/devices free from skin contact  Taken 03/22/2024 0016 by Jule Ser, RN  Positioning for Skin: Right  Device Skin Pressure Protection:   absorbent pad utilized/changed   adhesive use limited   tubing/devices free from skin contact  Skin Protection:   adhesive use limited   transparent dressing maintained   tubing/devices free from skin contact  Taken 03/21/2024 2201 by Jule Ser, RN  Positioning for Skin: Left  Device Skin Pressure Protection:   absorbent pad utilized/changed   adhesive use limited   tubing/devices free from skin contact  Skin Protection:   adhesive use limited   transparent dressing maintained   tubing/devices free from skin contact  Taken 03/21/2024 2000 by Jule Ser, RN  Positioning for Skin: Right  Device Skin Pressure Protection:   absorbent pad utilized/changed   adhesive use limited   tubing/devices free from skin contact  Skin Protection:   adhesive use limited   transparent dressing maintained   tubing/devices free from skin contact  Intervention: Prevent and Manage VTE (Venous Thromboembolism) Risk  Recent Flowsheet Documentation  Taken 03/22/2024 0426 by Jule Ser, RN  Anti-Embolism Device Type: SCD, Knee  Anti-Embolism Device Status: On  Anti-Embolism Device Location: BLE  Taken 03/22/2024 0425 by Jule Ser, RN  Anti-Embolism Device Type: SCD,  Knee  Anti-Embolism Device Status: On  Anti-Embolism Device Location: BLE  Taken 03/22/2024 0225 by Jule Ser, RN  Anti-Embolism Device Type: SCD, Knee  Anti-Embolism Device Status: On  Anti-Embolism Device Location: BLE  Taken 03/22/2024 0025 by Jule Ser, RN  Anti-Embolism Device Type: SCD, Knee  Anti-Embolism Device Status: On  Anti-Embolism Device Location: BLE  Taken 03/22/2024 0016 by Jule Ser, RN  Anti-Embolism Device Type: SCD, Knee  Anti-Embolism Device Status: On  Anti-Embolism Device Location: BLE  Taken 03/21/2024 2201 by Jule Ser, RN  Anti-Embolism Device Type: SCD, Knee  Anti-Embolism Device Status: On  Anti-Embolism Device Location: BLE  Taken 03/21/2024 2000 by Jule Ser, RN  Anti-Embolism Device Type: SCD, Knee  Anti-Embolism Device Status: On  Anti-Embolism Device Location: BLE  Intervention: Prevent Infection  Recent Flowsheet Documentation  Taken 03/22/2024 0425 by Jule Ser, RN  Infection Prevention:   hand hygiene promoted   personal protective equipment utilized  Taken 03/22/2024 0225 by Jule Ser, RN  Infection Prevention:   hand hygiene promoted   personal protective equipment utilized  Taken 03/22/2024 0025 by Jule Ser, RN  Infection Prevention:   hand hygiene promoted   personal protective equipment utilized  Taken 03/21/2024 2201 by Jule Ser, RN  Infection Prevention:   hand hygiene promoted   personal protective equipment utilized  Taken 03/21/2024 2000 by Jule Ser, RN  Infection Prevention:   hand hygiene promoted   personal protective equipment utilized  Goal: Optimal Comfort and Wellbeing  Outcome: Ongoing - Unchanged  Goal: Readiness for Transition of Care  Outcome: Ongoing - Unchanged  Goal: Rounds/Family Conference  Outcome: Ongoing - Unchanged     Problem: Infection  Goal: Absence of Infection Signs and Symptoms  Outcome: Ongoing - Unchanged  Intervention: Prevent or Manage Infection  Recent Flowsheet Documentation  Taken 03/22/2024 0425 by Jule Ser, RN  Infection Management: aseptic technique maintained  Isolation Precautions: contact precautions maintained  Taken 03/22/2024 0225 by Jule Ser, RN  Infection Management: aseptic technique maintained  Isolation Precautions: contact precautions maintained  Taken 03/22/2024 0025 by Jule Ser, RN  Infection Management: aseptic technique maintained  Isolation Precautions: contact precautions maintained  Taken 03/21/2024 2201 by Jule Ser, RN  Infection Management: aseptic technique maintained  Isolation Precautions: contact precautions maintained  Taken 03/21/2024 2000 by Jule Ser, RN  Infection Management: aseptic technique maintained  Isolation Precautions: contact precautions maintained     Problem: Fall Injury Risk  Goal: Absence of Fall and Fall-Related Injury  Outcome: Ongoing - Unchanged  Intervention: Promote Injury-Free Environment  Recent Flowsheet Documentation  Taken 03/22/2024 0425 by Jule Ser, RN  Safety Interventions:   bed alarm   lighting adjusted for tasks/safety  Taken 03/22/2024 0225 by Jule Ser, RN  Safety Interventions:   bed alarm   lighting adjusted for tasks/safety  Taken 03/22/2024 0025 by Jule Ser, RN  Safety Interventions:   bed alarm   lighting adjusted for tasks/safety  Taken 03/21/2024 2201 by Jule Ser, RN  Safety Interventions:   bed alarm   lighting adjusted for tasks/safety  Taken 03/21/2024 2000 by Jule Ser, RN  Safety Interventions:   bed alarm   lighting adjusted for tasks/safety     Problem: Wound  Goal: Optimal Coping  Outcome: Ongoing - Unchanged  Goal: Optimal Functional Ability  Outcome: Ongoing - Unchanged  Intervention: Optimize Functional Ability  Recent Flowsheet Documentation  Taken 03/22/2024 0425 by Jule Ser, RN  Activity Management: bedrest  Taken 03/22/2024 0225 by Jule Ser, RN  Activity Management: bedrest  Taken 03/22/2024 0025 by Jule Ser, RN  Activity Management: bedrest  Taken 03/21/2024 2201 by Jule Ser, RN  Activity Management: bedrest  Taken 03/21/2024 2000 by Jule Ser, RN  Activity Management: bedrest  Goal: Absence of Infection Signs and Symptoms  Outcome: Ongoing - Unchanged  Intervention: Prevent or Manage Infection  Recent Flowsheet Documentation  Taken 03/22/2024 0425 by Jule Ser, RN  Infection Management: aseptic technique maintained  Isolation Precautions: contact precautions maintained  Taken 03/22/2024 0225 by Jule Ser, RN  Infection Management: aseptic technique maintained  Isolation Precautions: contact precautions maintained  Taken 03/22/2024 0025 by Jule Ser, RN  Infection Management: aseptic technique maintained  Isolation Precautions: contact precautions maintained  Taken 03/21/2024 2201 by Jule Ser, RN  Infection Management: aseptic technique maintained  Isolation Precautions: contact precautions maintained  Taken 03/21/2024 2000 by Jule Ser, RN  Infection Management: aseptic technique maintained  Isolation Precautions: contact precautions maintained  Goal: Improved Oral Intake  Outcome: Ongoing - Unchanged  Goal: Optimal Pain Control and Function  Outcome: Ongoing - Unchanged  Intervention: Prevent or Manage Pain  Recent Flowsheet Documentation  Taken 03/22/2024 0425 by Jule Ser, RN  Sleep/Rest Enhancement: awakenings minimized  Taken 03/22/2024 0225 by Jule Ser, RN  Sleep/Rest Enhancement: awakenings minimized  Taken 03/22/2024 0025 by Jule Ser, RN  Sleep/Rest Enhancement: awakenings minimized  Taken 03/21/2024 2201 by Jule Ser, RN  Sleep/Rest Enhancement: awakenings minimized  Taken 03/21/2024 2000 by Jule Ser, RN  Sleep/Rest Enhancement: awakenings minimized  Goal: Skin Health and Integrity  Outcome: Ongoing - Unchanged  Intervention: Optimize Skin Protection  Recent Flowsheet Documentation  Taken 03/22/2024 0426 by Jule Ser, RN  Pressure Reduction Techniques:   heels elevated off bed   frequent weight shift encouraged  Pressure Reduction Devices: pressure-redistributing mattress utilized  Skin Protection:   adhesive use limited   transparent dressing maintained   tubing/devices free from skin contact  Taken 03/22/2024 0425 by Jule Ser, RN  Activity Management: bedrest  Pressure Reduction Techniques:   heels elevated off bed   frequent weight shift encouraged  Head of Bed (HOB) Positioning: HOB at 30-45 degrees  Pressure Reduction Devices: pressure-redistributing mattress utilized  Skin Protection:   adhesive use limited   transparent dressing maintained   tubing/devices free from skin contact  Taken 03/22/2024 0225 by Jule Ser, RN  Activity Management: bedrest  Pressure Reduction Techniques:   heels elevated off bed   frequent weight shift encouraged  Head of Bed (HOB) Positioning: HOB at 30-45 degrees  Pressure Reduction Devices: pressure-redistributing mattress utilized  Skin Protection:   adhesive use limited   transparent dressing maintained   tubing/devices free from skin contact  Taken 03/22/2024 0025 by Jule Ser, RN  Activity Management: bedrest  Pressure Reduction Techniques:   heels elevated off bed   frequent weight shift encouraged  Head of Bed (HOB) Positioning: HOB at 30-45 degrees  Pressure Reduction Devices: pressure-redistributing mattress utilized  Skin Protection:   adhesive use limited   transparent dressing maintained   tubing/devices free from skin contact  Taken 03/22/2024 0016 by Jule Ser, RN  Pressure Reduction Techniques:   heels elevated off bed   frequent weight shift encouraged  Pressure Reduction Devices: pressure-redistributing mattress utilized  Skin Protection:   adhesive use limited   transparent dressing maintained   tubing/devices free from skin contact  Taken 03/21/2024 2201 by Jule Ser, RN  Activity Management: bedrest  Pressure Reduction Techniques:   heels elevated off bed  frequent weight shift encouraged  Head of Bed (HOB) Positioning: HOB at 30-45 degrees  Pressure Reduction Devices: pressure-redistributing mattress utilized  Skin Protection:   adhesive use limited   transparent dressing maintained   tubing/devices free from skin contact  Taken 03/21/2024 2000 by Jule Ser, RN  Activity Management: bedrest  Pressure Reduction Techniques:   heels elevated off bed   frequent weight shift encouraged  Head of Bed (HOB) Positioning: HOB at 30-45 degrees  Pressure Reduction Devices: pressure-redistributing mattress utilized  Skin Protection:   adhesive use limited   transparent dressing maintained   tubing/devices free from skin contact  Taken 03/21/2024 1930 by Jule Ser, RN  Head of Bed [facility]) Positioning: HOB at 30-45 degrees  Goal: Optimal Wound Healing  Outcome: Ongoing - Unchanged  Intervention: Promote Wound Healing  Recent Flowsheet Documentation  Taken 03/22/2024 0425 by Jule Ser, RN  Sleep/Rest Enhancement: awakenings minimized  Taken 03/22/2024 0225 by Jule Ser, RN  Sleep/Rest Enhancement: awakenings minimized  Taken 03/22/2024 0025 by Jule Ser, RN  Sleep/Rest Enhancement: awakenings minimized  Taken 03/21/2024 2201 by Jule Ser, RN  Sleep/Rest Enhancement: awakenings minimized  Taken 03/21/2024 2000 by Jule Ser, RN  Sleep/Rest Enhancement: awakenings minimized     Problem: Skin Injury Risk Increased  Goal: Skin Health and Integrity  Outcome: Ongoing - Unchanged  Intervention: Optimize Skin Protection  Recent Flowsheet Documentation  Taken 03/22/2024 0426 by Jule Ser, RN  Pressure Reduction Techniques:   heels elevated off bed   frequent weight shift encouraged  Pressure Reduction Devices: pressure-redistributing mattress utilized  Skin Protection:   adhesive use limited   transparent dressing maintained   tubing/devices free from skin contact  Taken 03/22/2024 0425 by Jule Ser, RN  Activity Management: bedrest  Pressure Reduction Techniques:   heels elevated off bed   frequent weight shift encouraged  Head of Bed (HOB) Positioning: HOB at 30-45 degrees  Pressure Reduction Devices: pressure-redistributing mattress utilized  Skin Protection:   adhesive use limited   transparent dressing maintained   tubing/devices free from skin contact  Taken 03/22/2024 0225 by Jule Ser, RN  Activity Management: bedrest  Pressure Reduction Techniques:   heels elevated off bed   frequent weight shift encouraged  Head of Bed (HOB) Positioning: HOB at 30-45 degrees  Pressure Reduction Devices: pressure-redistributing mattress utilized  Skin Protection:   adhesive use limited   transparent dressing maintained   tubing/devices free from skin contact  Taken 03/22/2024 0025 by Jule Ser, RN  Activity Management: bedrest  Pressure Reduction Techniques:   heels elevated off bed   frequent weight shift encouraged  Head of Bed (HOB) Positioning: HOB at 30-45 degrees  Pressure Reduction Devices: pressure-redistributing mattress utilized  Skin Protection:   adhesive use limited   transparent dressing maintained   tubing/devices free from skin contact  Taken 03/22/2024 0016 by Jule Ser, RN  Pressure Reduction Techniques:   heels elevated off bed   frequent weight shift encouraged  Pressure Reduction Devices: pressure-redistributing mattress utilized  Skin Protection:   adhesive use limited   transparent dressing maintained   tubing/devices free from skin contact  Taken 03/21/2024 2201 by Jule Ser, RN  Activity Management: bedrest  Pressure Reduction Techniques:   heels elevated off bed   frequent weight shift encouraged  Head of Bed (HOB) Positioning: HOB at 30-45 degrees  Pressure Reduction Devices: pressure-redistributing mattress utilized  Skin Protection:   adhesive use limited   transparent dressing maintained   tubing/devices free from skin contact  Taken 03/21/2024 2000 by Jule Ser, RN  Activity Management: bedrest  Pressure Reduction Techniques:   heels elevated off bed   frequent weight shift encouraged  Head of Bed (HOB) Positioning: HOB at 30-45 degrees  Pressure Reduction Devices: pressure-redistributing mattress utilized  Skin Protection:   adhesive use limited   transparent dressing maintained   tubing/devices free from skin contact  Taken 03/21/2024 1930 by Jule Ser, RN  Head of Bed (HOB) Positioning: HOB at 30-45 degrees     Problem: Self-Care Deficit  Goal: Improved Ability to Complete Activities of Daily Living  Outcome: Ongoing - Unchanged     Problem: Artificial Airway  Goal: Effective Communication  Outcome: Ongoing - Unchanged  Goal: Optimal Device Function  Outcome: Ongoing - Unchanged  Intervention: Optimize Device Care and Function  Recent Flowsheet Documentation  Taken 03/22/2024 0426 by Jule Ser, RN  Airway/Ventilation Management: airway patency maintained  Taken 03/22/2024 0425 by Jule Ser, RN  Aspiration Precautions:   NPO pending swallow screening/evaluation   oral hygiene care promoted  Oral Care:   mouth swabbed   suction provided  Taken 03/22/2024 0225 by Jule Ser, RN  Aspiration Precautions:   NPO pending swallow screening/evaluation   oral hygiene care promoted  Oral Care:   mouth swabbed   suction provided   teeth brushed   tongue brushed  Taken 03/22/2024 0025 by Jule Ser, RN  Aspiration Precautions:   NPO pending swallow screening/evaluation   oral hygiene care promoted  Oral Care:   mouth swabbed   suction provided  Taken 03/22/2024 0016 by Jule Ser, RN  Airway/Ventilation Management: airway patency maintained  Taken 03/21/2024 2201 by Jule Ser, RN  Aspiration Precautions:   NPO pending swallow screening/evaluation   oral hygiene care promoted  Oral Care:   mouth swabbed   suction provided  Taken 03/21/2024 2000 by Jule Ser, RN  Airway/Ventilation Management: airway patency maintained  Aspiration Precautions:   NPO pending swallow screening/evaluation   oral hygiene care promoted  Oral Care:   mouth swabbed   suction provided   teeth brushed   tongue brushed  Taken 03/21/2024 1930 by Jule Ser, RN  Oral Care:   mouth swabbed   suction provided   teeth brushed   tongue brushed  Goal: Absence of Device-Related Skin or Tissue Injury  Outcome: Ongoing - Unchanged  Intervention: Maintain Skin and Tissue Health  Recent Flowsheet Documentation  Taken 03/22/2024 0426 by Jule Ser, RN  Device Skin Pressure Protection:   absorbent pad utilized/changed   adhesive use limited   tubing/devices free from skin contact  Taken 03/22/2024 0425 by Jule Ser, RN  Device Skin Pressure Protection:   absorbent pad utilized/changed   adhesive use limited   tubing/devices free from skin contact  Taken 03/22/2024 0225 by Jule Ser, RN  Device Skin Pressure Protection:   absorbent pad utilized/changed   adhesive use limited   tubing/devices free from skin contact  Taken 03/22/2024 0025 by Jule Ser, RN  Device Skin Pressure Protection:   absorbent pad utilized/changed   adhesive use limited   tubing/devices free from skin contact  Taken 03/22/2024 0016 by Jule Ser, RN  Device Skin Pressure Protection:   absorbent pad utilized/changed   adhesive use limited   tubing/devices free from skin contact  Taken 03/21/2024 2201 by Jule Ser, RN  Device Skin Pressure Protection:   absorbent pad utilized/changed   adhesive use limited   tubing/devices free from skin contact  Taken 03/21/2024 2000 by Jule Ser,  RN  Device Skin Pressure Protection:   absorbent pad utilized/changed   adhesive use limited   tubing/devices free from skin contact     Problem: Malnutrition  Goal: Improved Nutritional Intake  Outcome: Ongoing - Unchanged     Problem: Mechanical Ventilation Invasive  Goal: Effective Communication  Outcome: Ongoing - Unchanged  Goal: Optimal Device Function  Outcome: Ongoing - Unchanged  Intervention: Optimize Device Care and Function  Recent Flowsheet Documentation  Taken 03/22/2024 0426 by Jule Ser, RN  Airway/Ventilation Management: airway patency maintained  Taken 03/22/2024 0425 by Jule Ser, RN  Oral Care:   mouth swabbed   suction provided  Taken 03/22/2024 0225 by Jule Ser, RN  Oral Care:   mouth swabbed   suction provided   teeth brushed   tongue brushed  Taken 03/22/2024 0025 by Jule Ser, RN  Oral Care:   mouth swabbed   suction provided  Taken 03/22/2024 0016 by Jule Ser, RN  Airway/Ventilation Management: airway patency maintained  Taken 03/21/2024 2201 by Jule Ser, RN  Oral Care:   mouth swabbed   suction provided  Taken 03/21/2024 2000 by Jule Ser, RN  Airway/Ventilation Management: airway patency maintained  Oral Care:   mouth swabbed   suction provided   teeth brushed   tongue brushed  Taken 03/21/2024 1930 by Jule Ser, RN  Oral Care:   mouth swabbed   suction provided   teeth brushed   tongue brushed  Goal: Mechanical Ventilation Liberation  Outcome: Ongoing - Unchanged  Intervention: Promote Extubation and Mechanical Ventilation Liberation  Recent Flowsheet Documentation  Taken 03/22/2024 0425 by Jule Ser, RN  Sleep/Rest Enhancement: awakenings minimized  Taken 03/22/2024 0225 by Jule Ser, RN  Sleep/Rest Enhancement: awakenings minimized  Taken 03/22/2024 0025 by Jule Ser, RN  Sleep/Rest Enhancement: awakenings minimized  Taken 03/21/2024 2201 by Jule Ser, RN  Sleep/Rest Enhancement: awakenings minimized  Taken 03/21/2024 2000 by Jule Ser, RN  Sleep/Rest Enhancement: awakenings minimized  Goal: Optimal Nutrition Delivery  Outcome: Ongoing - Unchanged  Goal: Absence of Device-Related Skin and Tissue Injury  Outcome: Ongoing - Unchanged  Intervention: Maintain Skin and Tissue Health  Recent Flowsheet Documentation  Taken 03/22/2024 0426 by Jule Ser, RN  Device Skin Pressure Protection:   absorbent pad utilized/changed   adhesive use limited   tubing/devices free from skin contact  Taken 03/22/2024 0425 by Jule Ser, RN  Device Skin Pressure Protection:   absorbent pad utilized/changed   adhesive use limited   tubing/devices free from skin contact  Taken 03/22/2024 0225 by Jule Ser, RN  Device Skin Pressure Protection:   absorbent pad utilized/changed   adhesive use limited   tubing/devices free from skin contact  Taken 03/22/2024 0025 by Jule Ser, RN  Device Skin Pressure Protection:   absorbent pad utilized/changed   adhesive use limited   tubing/devices free from skin contact  Taken 03/22/2024 0016 by Jule Ser, RN  Device Skin Pressure Protection:   absorbent pad utilized/changed   adhesive use limited   tubing/devices free from skin contact  Taken 03/21/2024 2201 by Jule Ser, RN  Device Skin Pressure Protection:   absorbent pad utilized/changed   adhesive use limited   tubing/devices free from skin contact  Taken 03/21/2024 2000 by Jule Ser, RN  Device Skin Pressure Protection:   absorbent pad utilized/changed   adhesive use limited   tubing/devices free from skin contact  Goal: Absence of Ventilator-Induced Lung Injury  Outcome: Ongoing - Unchanged  Intervention: Prevent Ventilator-Associated Pneumonia  Recent  Flowsheet Documentation  Taken 03/22/2024 0425 by Jule Ser, RN  Head of Bed [facility]) Positioning: HOB at 30-45 degrees  Oral Care:   mouth swabbed   suction provided  Taken 03/22/2024 0225 by Jule Ser, RN  Head of Bed [facility]) Positioning: HOB at 30-45 degrees  Oral Care:   mouth swabbed   suction provided   teeth brushed   tongue brushed  Taken 03/22/2024 0025 by Jule Ser, RN  Head of Bed [facility]) Positioning: HOB at 30-45 degrees  Oral Care:   mouth swabbed   suction provided  Taken 03/21/2024 2201 by Jule Ser, RN  Head of Bed [facility]) Positioning: HOB at 30-45 degrees  Oral Care:   mouth swabbed   suction provided  Taken 03/21/2024 2000 by Jule Ser, RN  Head of Bed [facility]) Positioning: HOB at 30-45 degrees  Oral Care:   mouth swabbed   suction provided   teeth brushed   tongue brushed  Taken 03/21/2024 1930 by Jule Ser, RN  Head of Bed [facility]) Positioning: HOB at 30-45 degrees  Oral Care:   mouth swabbed   suction provided   teeth brushed   tongue brushed

## 2024-03-22 NOTE — Unmapped (Signed)
 I went to bedside to discuss the patient's care plan.  The patient's daughter, Tammy Braun, and the patient's bedside nurse, Tammy Braun, were physically present.  The patient's other daughter and healthcare POA, Tammy Braun, joined by phone.  I discussed with the group that Tammy Braun was doing as well as she has been since being intubated.  She diuresed well yesterday and her CXR was improving.  I informed them that her CXR this morning looked worse, but was underinflated. When we repeated the CXR it was better than the day prior. I discussed with them that she had passed her SBT and was ready to extubate.  I cautioned them, however, that due to her prolonged course, she was deconditioned and at high risk of failing due either to aspiration, increased work of breathing, or other possible causes.  I confirmed with them that she would like to be reintubated should she fail. I informed them that if she failed, that she would not get stronger on the ventilator and only get more deconditioned as time went on, therefore, if she failed and was reintubated that we would strongly recommend moving towards a tracheostomy. Tammy Braun stated that she was not going to fail and that all we had to do was get her to a chair.  I reiterated that I was discussing the possible risks of extubation and merely wanted them to be aware of the possible complications.  Tammy Braun said I know and refused to engage further with possible risks of extubation.  I then reraised the idea of enteral feeding access. I informed them that a nasogastric tube was not a longterm solution and that her high risk of aspiration strongly supported transitioning to a durable enteral solution. 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  I informed them that this procedure carried a very high risk of needing to be reintubated for placement. I raised the possibility that we could keep the patient intubated and attempt placement of a Jtube first prior to extubating.  Tammy Braun became angry and stated I already told Dr. Irving Burton that we weren't doing that. I reiterated that my job was to make sure that all options had been discussed prior to proceeding. Tammy Braun stated We need to deal with what is going to kill her now which is that pneumonia, and we can deal with the rest later. I again informed her that dealing with it later meant an increased risk of requiring periprocedural reintubation which increases the risk of aspiration and failure to wean from the vent. Tammy Braun again stated we're not doing that. Given the healthcare POAs wishes, we will proceed with extubation early this afternoon with reintubation if required and deferring a discussion of enteral feeding access to a later date.    -Extubate  -Okay to reintubate if required  -Defer discussion of durable enteral feeding access    Tammy Braun  Fellow, Surgical Critical Care

## 2024-03-22 NOTE — Unmapped (Signed)
 [facility] PROGRESS NOTE     Date of Service: 03/22/2024    Hospital Day: LOS: 24 days        Surgery Date: TBD   Surgical Attending: Joanie Coddington, MD    Critical Care Attending: Lia Hopping, MD    Interval History:   Diuresis NN 1L, met goal. NE at 4 this AM. Continues to pass SBT. Goal of extubation this AM.    History of Present Illness:   Tammy Braun is a 83 y.o. female with PMH bladder cancer with cystectomy and ileal conduit, COPD, recent dx of afib, moderate-severe TR, CAD (high calcification score), T2DM presented to [facility] with projectile vomiting and abdominal pain, found to have SBO with transition point at level of ileostomy on CT A/P. On initial presentation, labs remarkable for leukocytosis thought to be iso hemoconcentration, AKI on CKD, mildly elevated venous lactate improved with resuscitation. Admitted to [facility] for worsening hypoxia on HFNC requiring intubation in setting of hiatal hernia. OR on 02/28/24 for ex-lap, reduciton of parastomal hernia, small bowel resection with primary anastomosis and lysis of adhesions.    Hospital Course:  - 02/26/24: Admitted to [facility], floor status, consult to urology for possible ileal conduit revision.   - 02/27/24: Patient level of care escalated to step-down iso increased oxygen needs while in ED.  - 02/28/24: Upgraded to ICU. Intubated at bed-side. OR for ex-lap, reduction of parastomal hernia, small bowel resection with primary anastomosis, lysis of adhesions, abdomen left open with ABThera in place.   - 03/01/24: OR for attempt at abdominal closure. Left open due to desaturations during closure attempt. ABThera in place.  - 03/03/24: RTOR for abdominal closure  -03/06/24: Vas cath placed, CRRT started   -03/09/24: Extubated to HFNC  -03/10/24: CRRT discontinued   - 03/11/24: Re-intubated for hypoxic respiratory failure 2/2 mucus plug   - 03/14/24 Extubated to HFNC   -03/15/24: Vas cath removed   -03/16/24: Placed on BiPap for complete collapse of L lung 2/2 probable mucus plugging  -03/19/24: Re-intubated for mucus plugging. Therapeutic bronch, BAL sent.      ASSESSMENT & PLAN:     Neurologic:  - Pain: tylenol SCH, fent gtt and pushes PRN  - Sedation: none    #schizoaffective disorder  - Monthly prolixin injection (given 3/14)    Cardiovascular:  - MAP goal > 65  - NE gtt, wean as able    #HFpEF  #Pulm HTN  - ECHO 3/12- LVEF 70%, moderately dilated RV with moderate systolic dysfunction, severe pulmonary hypertension.    #Hx of HTN  -holding home amlodipine    #Hx of HLD  - atorvastatin    #Afib/flutter w RVR:   - Prior hx of paroxysmal afib. CHADSvasc = 6, no AC PTA per chart review.      > Holding home diltiazem, question efficacy given likely poor absorption  - Amio via NG tube 200mg  daily   - heparin gtt for [facility]     #Pulmonary embolism  - 3/5 CTA chest: Filling defect in L anterior segmental artery  - heparin gtt     #Acute thrombus right basilic   - Identified 3/3 on BUE duplex  - Supportive care, superficial     Respiratory:  #Acute on chronic hypoxemia  #Hx of COPD  - Aggressive pulmonary toilet  - Continue Brovana & atrovent nebs  - Continue hypertonic saline nebs for clearance     #Aspiration  #Poor pulmonary clearance   - Re-intubated 3/21 for  mucous plugging of L lung and inability to clear secretions  - Continues to pass SBT, goal is to extubate this afternoon    #Pseudomonas Pneumonia, resolved  #VAP  - Completed antibiotics course    FEN/GI:  F: ML  E: Replete electrolytes PRN  N: NPO, TF paused in anticipation of extubation, restart this afternoon  - GI prophylaxis: protonix  - Bowel regimen: miralax and senna BID   - Last BM: 3/21    #Hypernatremia  - Increase FWF to 50mL q1h     #S/p ex-lap, reduction of parastomal hernia, small bowel resection with primary anastomosis, lysis of adhesions  - CT 3/13 demonstrating partial small bowel obstruction  - Blake drain #2 removed 3/16, Blake drain #1 removed 3/17  - WTD dressing changes daily per nursing to midline incision    > midline incision drainage culture with 1+ candida     #Large hiatal hernia, chronic  - re-eval on CT chest/abd/pelvis 3/13 stable    Renal/Genitourinary:  #AKI on CKD  - Nephrology signed off, HD cath removed  - Lasix 40mg  x1, NN goal - 1L    > reassess in PM     #H/o bladder cancer s/p radical cystectomy with ileal conduit (2012):  #Parastomal hernia:  - Strict I&Os  - Ok for no foley in stoma at this time per urology    Endocrine:   #hyperglycemia   - Glycemic Control: SSI, continue NPH 15 unit BID    Hematologic:   - Hgb stable  - Monitor CBC daily, transfuse for hgb 7mg /dL  - DVT ppx: heparin gtt for PE    Immunologic/Infectious Disease:  - Afebrile with resolving leukocytosis     - Cultures:   - Blood cultures (3/4): negative, final   - BAL (3/4): Pseudomonas aeruginosa - susceptible to meropenum    - BAL (3/8): Pseudomonas aeruginosa and candida glabrata  -3/12: Susceptibilities show resistance to Meropenum, Cefepime, Ceftaz and Zosyn.   - Bcx (3/11): Negative, final  - BAL (3/12): 600,000+ psuedomonas (susceptibilities: resistant to meropenum)  - Ucx (3/13): no growth  - BAL (3/20): <10,000 CFU    - Antimicrobials:    - Ceftriaxone 2/27- 2/28 - colonized UTI   - Zosyn (3/1 - 3/5) for abdominal contamination  - Zosyn (3/6) for pseudomonas when sensitivities remained pending  - Zerbaxa (3/12-3/21) for 10 day course    Musculoskeletal:   #Deconditioned   - PT/OT consulted     Daily Care Checklist:   - Stress Ulcer Prevention: No  - DVT Prophylaxis: Chemical: Heparin drip  - Daily Awakening: Yes  - Spontaneous Breathing Trial: Yes  - Indication for Central/PICC Line: Yes  Infusions requiring central access, Hemodynamic monitoring, and Inadequate peripheral access, LIJ triple lumen (potentially remove today if extubated)  - Indication for Urinary Catheter: No   - Diagnostic images/reports of past 24hrs reviewed: Yes    Disposition:   - Continue ICU care  - PT/OT consulted:yes, needs reconsulted when extubated     SUBJECTIVE:      Intubated, sedated. Moves all 4 extremities to commands. Shakes head to question of pain.     OBJECTIVE:     Physical Exam:  Constitutional: Laying in bed, intubated, lightly sedated  Neurologic: Opens eyes to voice. Nods yes/no appropriately. Moves all 4 extremities to command  Respiratory: intubated and mechanically ventilated   Cardiovascular:  Irregularly irregular rhythm ~110bpm   Gastrointestinal: Soft abdomen, midline incision staples in tact, WTD packing in place. Urostomy  draining clear yellow urine.   Musculoskeletal: Trace BLE/BUE edema.  Skin: Warm and dry     Temp:  [36.8 ??C (98.2 ??F)-37.1 ??C (98.7 ??F)] 36.8 ??C (98.2 ??F)  Heart Rate:  [102-129] 115  SpO2 Pulse:  [79-129] 122  Resp:  [9-20] 12  BP: (84-119)/(46-71) 100/53  MAP (mmHg):  [61-85] 68  FiO2 (%):  [35 %] 35 %  SpO2:  [90 %-100 %] 100 %     Recent Laboratory Results:  No results for input(s): SPECTYPEART, PHART, PCO2ART, PO2ART, HCO3ART, BEART, O2SATART in the last 24 hours.    Recent Labs     Units 03/21/24  0356 03/21/24  1436 03/22/24  0444   NA mmol/L 148* 148* 149*   K mmol/L 4.2 4.0 4.0   CL mmol/L 111* 110* 109*   CO2 mmol/L 28.0 28.0 30.0   BUN mg/dL 67* 72* 68*   CREATININE mg/dL 2.44* 0.10* 2.72*   GLU mg/dL 536 644 034     Lab Results   Component Value Date    BILITOT 0.3 03/22/2024    BILITOT 0.3 03/15/2024    BILIDIR 0.10 03/22/2024    BILIDIR 0.20 03/15/2024    ALT <7 (L) 03/22/2024    ALT 10 03/15/2024    AST 9 03/22/2024    AST 13 03/15/2024    GGT 16 09/17/2011    GGT 354 (H) 05/03/2011    ALKPHOS 70 03/22/2024    ALKPHOS 60 03/15/2024    PROT 6.5 03/22/2024    PROT 5.7 03/15/2024    ALBUMIN 2.0 (L) 03/22/2024    ALBUMIN 1.9 (L) 03/15/2024     Recent Labs     Units 03/21/24  1234 03/21/24  1649 03/21/24  1812 03/22/24  0007   POCGLU mg/dL 742 595 638 756     Recent Labs     Units 03/20/24  0426 03/21/24  0356 03/22/24  0444   WBC 10*9/L 11.8* 12.8* 11.5*   RBC 10*12/L 2.74* 2.67* 2.78*   HGB g/dL 7.4* 7.4* 7.6*   HCT % 23.3* 23.0* 24.0*   MCV fL 85.3 85.9 86.1   MCH pg 27.1 27.6 27.5   MCHC g/dL 43.3* 29.5 18.8*   RDW % 18.2* 17.9* 18.2*   PLT 10*9/L 281 244 268   MPV fL 9.9 9.9 9.9     Recent Labs     Units 03/21/24  1436 03/21/24  2209 03/22/24  0444   APTT sec 143.6* 72.3* 80.2*          Lines & Tubes:   Patient Lines/Drains/Airways Status       Active Peripheral & Central Intravenous Access       Name Placement date Placement time Site Days    CVC Triple Lumen 03/07/24 Non-tunneled Left Internal jugular 03/07/24  0849  Internal jugular  14                     Patient Lines/Drains/Airways Status       Active Wounds       Name Placement date Placement time Site Days    Surgical Site 04/08/18 Shoulder Left 04/08/18  1338  -- 2174    Surgical Site 06/05/22 Eye Left 06/05/22  0928  -- 655    Surgical Site 06/19/22 Eye Right 06/19/22  1024  -- 641    Surgical Site 03/03/24 Abdomen 03/03/24  1013  -- 18    Wound 06/24/20 Other (comment) Buttocks inner right buttock 06/24/20  1010  Buttocks  1366    Wound 03/11/24 Other (comment) Sacrum Mid moisture/friction injury 03/11/24  1200  Sacrum  10    Wound 03/13/24 Neck Right open area note arount the HD catheter durng the dressing change 03/13/24  0930  Neck  8                     Respiratory/ventilator settings for last 24 hours:   Vent Mode: PSV-CPAP  FiO2 (%): 35 %  S RR: 15  PEEP: 5 cm H20  PR SUP: 8 cm H20    Intake/Output last 3 shifts:  I/O last 3 completed shifts:  In: 3423.7 [I.V.:644.1; NG/GT:610; IV Piggyback:601.4]  Out: 5375 [Urine:4725; Emesis/NG output:650]    Daily/Recent Weight:  68.8 kg (151 lb 10.8 oz)    BMI:  Body mass index is 28.67 kg/m??.    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    Chronic kidney disease     Depression, psychotic (CMS-HCC) Diabetes mellitus (CMS-HCC)     in past    Emphysema of lung (CMS-HCC)     Financial difficulties     Frail elderly     Hearing impairment     Hernia     History of transfusion     Hyperlipidemia     Hypertension     Impaired mobility     Osteoporosis     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/08/2015    Visual impairment      Past Surgical History:   Procedure Laterality Date    ABDOMINAL SURGERY      BLADDER SURGERY      BREAST CYST EXCISION      CHEMOTHERAPY  2012    bladder    GALLBLADDER SURGERY      stone removal    ILEOSTOMY  2012    PR COLONOSCOPY FLX DX W/COLLJ SPEC WHEN PFRMD N/A 02/09/2015    Procedure: COLONOSCOPY, FLEXIBLE, PROXIMAL TO SPLENIC FLEXURE; DIAGNOSTIC, W/WO COLLECTION SPECIMEN BY BRUSH OR WASH;  Surgeon: Dewaine Conger, MD;  Location: [facility] [place];  Service: Gastroenterology    PR EXPLORATORY OF ABDOMEN N/A 02/28/2024    Procedure: EXPLORATORY LAPAROTOMY, EXPLORATORY CELIOTOMY WITH OR WITHOUT BIOPSY(S);  Surgeon: Renda Rolls, MD;  Location: [facility];  Service: General Surgery    PR RECONSTR TOTAL SHOULDER IMPLANT Left 04/08/2018    Procedure: ARTHROPLASTY, GLENOHUMERAL JOINT; TOTAL SHOULDER(GLENOID & PROXIMAL HUMERAL REPLACEMENT(EG, TOTAL SHOULDER);  Surgeon: Tomasa Rand, MD;  Location: [facility] OR [facility];  Service: Ortho Sports Medicine    PR REOPEN RECENT ABD EXPLORATORY Midline 03/01/2024    Procedure: REOPENING OF RECENT LAPAROTOMY;  Surgeon: [name]., MD;  Location: OR [facility];  Service: Trauma    PR REOPEN RECENT ABD EXPLORATORY N/A 03/03/2024    Procedure: REOPENING OF RECENT LAPAROTOMY;  Surgeon: Joanie Coddington, MD;  Location: OR [facility];  Service: Trauma    PR SIGMOIDOSCOPY,BIOPSY N/A 03/11/2015    Procedure: Arnell Sieving; WITH BIOPSY, SINGLE OR MULTIPLE;  Surgeon: Wilburt Finlay, MD;  [facility];  Service: Gastroenterology    PR XCAPSL CTRC RMVL INSJ IO LENS PROSTH W/O ECP Left 06/05/2022    Procedure: EXTRACAPSULAR CATARACT  REMOVAL W/INSERTION OF INTRAOCULAR LENS PROSTHESIS, MANUAL OR MECHANICAL TECHNIQUE WITHOUT ENDOSCOPIC CYCLOPHOTOCOAGULATION;  Surgeon: Garner Gavel, MD;  Location: [facility] OR [facility];  Service: Ophthalmology    PR XCAPSL CTRC RMVL INSJ IO LENS PROSTH W/O ECP Right 06/19/2022    Procedure: EXTRACAPSULAR CATARACT REMOVAL W/INSERTION OF INTRAOCULAR LENS PROSTHESIS, MANUAL OR MECHANICAL TECHNIQUE WITHOUT ENDOSCOPIC CYCLOPHOTOCOAGULATION;  Surgeon: Garner Gavel, MD;  Location: [facility] OR [facility];  Service: Ophthalmology     Scheduled Medications:   acetaminophen  1,000 mg Enteral tube: gastric Q8H SCH    amiodarone  200 mg Enteral tube: gastric Daily    arformoterol  15 mcg Nebulization BID (RT)    aspirin  81 mg Enteral tube: gastric Daily    atorvastatin  40 mg Enteral tube: gastric Daily    carboxymethylcellulose sodium  2 drop Both Eyes TID    esomeprazole  40 mg Enteral tube: gastric daily    flu vacc [id number](20yr up)-PF  0.5 mL Intramuscular During hospitalization    fluPHENAZine decanoate  50 mg Intramuscular Q30 Days    insulin NPH  15 Units Subcutaneous BID AC    insulin regular  0-20 Units Subcutaneous Q6H SCH    ipratropium  500 mcg Nebulization Q6H (RT)    methocarbamol  500 mg Enteral tube: gastric QID    polyethylene glycol  17 g Enteral tube: gastric BID    sennosides  2.5 mL Enteral tube: gastric BID    sodium chloride  10 mL Intravenous Q8H    sodium chloride  10 mL Intravenous Q8H    sodium chloride  10 mL Intravenous Q8H    sodium chloride  4 mL Nebulization Q6H (RT)     Continuous Infusions:   fentaNYL citrate (PF) 50 mcg/mL infusion 25 mcg/hr (03/21/24 1407)    heparin 16 Units/kg/hr (03/22/24 0316)    NORepinephrine bitartrate-NS 4 mcg/min (03/22/24 0011)    propofol 10 mg/mL infusion Stopped (03/21/24 0822)     PRN Medications:  albuterol, calcium gluconate, calcium gluconate, dextrose in water, fentaNYL (PF) **OR** fentaNYL (PF), heparin (porcine), heparin (porcine), magnesium sulfate in water, melatonin, nicotine polacrilex **OR** nicotine polacrilex, ondansetron, phenol, potassium chloride in water    Oliver Hum, MD   Surgical Intensive Care Unit  [facility]

## 2024-03-23 LAB — CBC
HEMATOCRIT: 24.3 % — ABNORMAL LOW (ref 34.0–44.0)
HEMOGLOBIN: 7.9 g/dL — ABNORMAL LOW (ref 11.3–14.9)
MEAN CORPUSCULAR HEMOGLOBIN CONC: 32.3 g/dL (ref 32.0–36.0)
MEAN CORPUSCULAR HEMOGLOBIN: 29.5 pg (ref 25.9–32.4)
MEAN CORPUSCULAR VOLUME: 91.3 fL (ref 77.6–95.7)
MEAN PLATELET VOLUME: 9.8 fL (ref 6.8–10.7)
PLATELET COUNT: 254 10*9/L (ref 150–450)
RED BLOOD CELL COUNT: 2.66 10*12/L — ABNORMAL LOW (ref 3.95–5.13)
RED CELL DISTRIBUTION WIDTH: 18.7 % — ABNORMAL HIGH (ref 12.2–15.2)
WBC ADJUSTED: 10 10*9/L (ref 3.6–11.2)

## 2024-03-23 LAB — BASIC METABOLIC PANEL
ANION GAP: 12 mmol/L (ref 5–14)
ANION GAP: 8 mmol/L (ref 5–14)
BLOOD UREA NITROGEN: 63 mg/dL — ABNORMAL HIGH (ref 9–23)
BLOOD UREA NITROGEN: 65 mg/dL — ABNORMAL HIGH (ref 9–23)
BUN / CREAT RATIO: 49
BUN / CREAT RATIO: 48
CALCIUM: 9.5 mg/dL (ref 8.7–10.4)
CALCIUM: 9.2 mg/dL (ref 8.7–10.4)
CHLORIDE: 111 mmol/L — ABNORMAL HIGH (ref 98–107)
CHLORIDE: 114 mmol/L — ABNORMAL HIGH (ref 98–107)
CO2: 28 mmol/L (ref 20.0–31.0)
CO2: 29 mmol/L (ref 20.0–31.0)
CREATININE: 1.28 mg/dL — ABNORMAL HIGH (ref 0.55–1.02)
CREATININE: 1.35 mg/dL — ABNORMAL HIGH (ref 0.55–1.02)
EGFR CKD-EPI (2021) FEMALE: 42 mL/min/{1.73_m2} — ABNORMAL LOW (ref >=60)
EGFR CKD-EPI (2021) FEMALE: 40 mL/min/{1.73_m2} — ABNORMAL LOW (ref >=60)
GLUCOSE RANDOM: 110 mg/dL (ref 70–179)
GLUCOSE RANDOM: 74 mg/dL (ref 70–179)
POTASSIUM: 4.1 mmol/L (ref 3.4–4.8)
POTASSIUM: 3.2 mmol/L — ABNORMAL LOW (ref 3.4–4.8)
SODIUM: 151 mmol/L — ABNORMAL HIGH (ref 135–145)
SODIUM: 151 mmol/L — ABNORMAL HIGH (ref 135–145)

## 2024-03-23 LAB — PHOSPHORUS
PHOSPHORUS: 3.6 mg/dL (ref 2.4–5.1)
PHOSPHORUS: 2.5 mg/dL (ref 2.4–5.1)

## 2024-03-23 LAB — APTT
APTT: 48.3 s — ABNORMAL HIGH (ref 24.8–38.4)
HEPARIN CORRELATION: 0.3

## 2024-03-23 LAB — MAGNESIUM
MAGNESIUM: 2.2 mg/dL (ref 1.6–2.6)
MAGNESIUM: 2 mg/dL (ref 1.6–2.6)

## 2024-03-23 MED ADMIN — methocarbamol (ROBAXIN) tablet 500 mg: 500 mg | GASTROENTERAL | @ 17:00:00

## 2024-03-23 MED ADMIN — insulin NPH (HumuLIN,NovoLIN) injection 15 Units: 15 [IU] | SUBCUTANEOUS | @ 11:00:00

## 2024-03-23 MED ADMIN — ipratropium (ATROVENT) 0.02 % nebulizer solution 500 mcg: 500 ug | RESPIRATORY_TRACT | @ 15:00:00

## 2024-03-23 MED ADMIN — sodium chloride (NS) 0.9 % flush 10 mL: 10 mL | INTRAVENOUS | @ 02:00:00

## 2024-03-23 MED ADMIN — sodium chloride (NS) 0.9 % flush 10 mL: 10 mL | INTRAVENOUS | @ 17:00:00

## 2024-03-23 MED ADMIN — carboxymethylcellulose sodium (THERATEARS) 0.25 % ophthalmic solution 2 drop: 2 [drp] | OPHTHALMIC | @ 14:00:00

## 2024-03-23 MED ADMIN — furosemide (LASIX) injection 40 mg: 40 mg | INTRAVENOUS | @ 14:00:00 | Stop: 2024-03-23

## 2024-03-23 MED ADMIN — acetaminophen (TYLENOL) tablet 1,000 mg: 1000 mg | GASTROENTERAL | @ 17:00:00

## 2024-03-23 MED ADMIN — potassium phosphate oral solution: 20 mmol | GASTROENTERAL | @ 23:00:00 | Stop: 2024-03-23

## 2024-03-23 MED ADMIN — sodium chloride (NS) 0.9 % flush 10 mL: 10 mL | INTRAVENOUS | @ 11:00:00

## 2024-03-23 MED ADMIN — arformoterol (BROVANA) nebulizer solution 15 mcg/2 mL: 15 ug | RESPIRATORY_TRACT | @ 15:00:00

## 2024-03-23 MED ADMIN — sodium chloride 3 % NEBULIZER solution 4 mL: 4 mL | RESPIRATORY_TRACT | @ 08:00:00

## 2024-03-23 MED ADMIN — potassium chloride (KLOR-CON) packet 40 mEq: 40 meq | GASTROENTERAL | @ 23:00:00 | Stop: 2024-03-23

## 2024-03-23 MED ADMIN — ipratropium (ATROVENT) 0.02 % nebulizer solution 500 mcg: 500 ug | RESPIRATORY_TRACT | @ 08:00:00

## 2024-03-23 MED ADMIN — carboxymethylcellulose sodium (THERATEARS) 0.25 % ophthalmic solution 2 drop: 2 [drp] | OPHTHALMIC | @ 02:00:00

## 2024-03-23 MED ADMIN — ipratropium (ATROVENT) 0.02 % nebulizer solution 500 mcg: 500 ug | RESPIRATORY_TRACT | @ 03:00:00

## 2024-03-23 MED ADMIN — sodium chloride 3 % NEBULIZER solution 4 mL: 4 mL | RESPIRATORY_TRACT | @ 15:00:00

## 2024-03-23 MED ADMIN — esomeprazole (NEXIUM) granules 40 mg: 40 mg | GASTROENTERAL | @ 10:00:00

## 2024-03-23 MED ADMIN — furosemide (LASIX) injection 40 mg: 40 mg | INTRAVENOUS | @ 23:00:00 | Stop: 2024-03-23

## 2024-03-23 MED ADMIN — sodium chloride 3 % NEBULIZER solution 4 mL: 4 mL | RESPIRATORY_TRACT | @ 03:00:00

## 2024-03-23 MED ADMIN — amiodarone (PACERONE) tablet 200 mg: 200 mg | GASTROENTERAL | @ 14:00:00

## 2024-03-23 MED ADMIN — polyethylene glycol (MIRALAX) packet 17 g: 17 g | GASTROENTERAL | @ 02:00:00

## 2024-03-23 MED ADMIN — methocarbamol (ROBAXIN) tablet 500 mg: 500 mg | GASTROENTERAL | @ 02:00:00

## 2024-03-23 MED ADMIN — sennosides (SENOKOT) oral syrup: 2.5 mL | GASTROENTERAL | @ 02:00:00

## 2024-03-23 MED ADMIN — arformoterol (BROVANA) nebulizer solution 15 mcg/2 mL: 15 ug | RESPIRATORY_TRACT | @ 03:00:00

## 2024-03-23 MED ADMIN — atorvastatin (LIPITOR) tablet 40 mg: 40 mg | GASTROENTERAL | @ 14:00:00

## 2024-03-23 MED ADMIN — sodium chloride 3 % NEBULIZER solution 4 mL: 4 mL | RESPIRATORY_TRACT | @ 19:00:00

## 2024-03-23 MED ADMIN — acetaminophen (TYLENOL) tablet 1,000 mg: 1000 mg | GASTROENTERAL | @ 02:00:00

## 2024-03-23 MED ADMIN — dextrose (D10W) 10% bolus 125 mL: 12.5 g | INTRAVENOUS | @ 23:00:00 | Stop: 2025-02-26

## 2024-03-23 MED ADMIN — methocarbamol (ROBAXIN) tablet 500 mg: 500 mg | GASTROENTERAL | @ 21:00:00

## 2024-03-23 MED ADMIN — aspirin chewable tablet 81 mg: 81 mg | GASTROENTERAL | @ 14:00:00

## 2024-03-23 MED ADMIN — methocarbamol (ROBAXIN) tablet 500 mg: 500 mg | GASTROENTERAL | @ 10:00:00

## 2024-03-23 MED ADMIN — heparin 25,000 Units/250 mL (100 units/mL) in 0.45% saline infusion (premade): 0-24 [IU]/kg/h | INTRAVENOUS | @ 11:00:00

## 2024-03-23 MED ADMIN — acetaminophen (TYLENOL) tablet 1,000 mg: 1000 mg | GASTROENTERAL | @ 10:00:00

## 2024-03-23 MED ADMIN — ipratropium (ATROVENT) 0.02 % nebulizer solution 500 mcg: 500 ug | RESPIRATORY_TRACT | @ 19:00:00

## 2024-03-23 NOTE — Unmapped (Signed)
 WOCN Consult Services                                                 Wound Evaluation: Pressure Injury    Reason for Consult:   - Initial  - Pressure Injury    Problem List:   Principal Problem:    SBO (small bowel obstruction) (CMS-HCC)    Assessment: Per EMR: Shelbe Haglund is a 83 y.o. female with PMH bladder cancer with cystectomy and ileal conduit, COPD, recent dx of afib, moderate-severe TR, CAD (high calcification score), T2DM presented to [facility] with projectile vomiting and abdominal pain, found to have SBO with transition point at level of ileostomy on CT A/P. On initial presentation, labs remarkable for leukocytosis thought to be iso hemoconcentration, AKI on CKD, mildly elevated venous lactate improved with resuscitation. Admitted to [facility] for worsening hypoxia on HFNC requiring intubation in setting of hiatal hernia. OR on 02/28/24 for ex-lap, reduciton of parastomal hernia, small bowel resection with primary anastomosis, lysis of adhesions, abdomen left open with ABThera in place.     Ms. Queener assessed while in bed. Daughter at bedside. Primary RN providing bath. Previously seen moisture/friction injuries to the buttock and groin have now healed. Ostomy appliance was checked and had been replaced today. As patient's wounds have healed we will sign off at this time. Patient left turned with pillows, bed in lowest/locked position, and call bell within reach.              Continence Status:   Incontinence of bladder: Foley in place  Incontinent of bowel: Bowel program in place    Moisture Associated Skin Damage:   - Incontinence-associated dermatitis (IAD)     Lab Results   Component Value Date    WBC 10.0 03/23/2024    HGB 7.9 (L) 03/23/2024    HCT 24.3 (L) 03/23/2024    ESR 53 (H) 12/11/2023    CRP 18.5 (H) 03/20/2024    A1C 6.8 (H) 12/11/2023    GLUF 105 08/04/2013    GLU 74 03/23/2024    POCGLU 63 (L) 03/23/2024    ALBUMIN 2.0 (L) 03/22/2024 PROT 6.5 03/22/2024     Risk Factors:   - Aging  - Cognitive Impairment  - Extended Hospitalization  - Friction/shear  - Immobility  - Moisture  - Multiple co-morbidities    Braden Scale Score: 15         Support Surface:   - Low Air Loss - ICU    Type Debridement Completed By WOCN:  N/A    Teaching:  - Moisture management  - Turning and repositioning  - Wound care    WOCN Recommendations:   - See nursing orders for wound care instructions.  - Contact WOCN with questions, concerns, or wound deterioration.    Topical Therapy/Interventions:   - n/a    Urostomy In-patient supplies:   Inpatient supply list: take home quantity listed     Pouch System:  Inpatient supply list: take home quantity listed     Inpatient supply list: take home quantity listed     Pouch System:  [employer] 2-Piece CTF Flat Wafer Blue (mfg [id number]) - lawson number [phone]) order 5  [employer] 2-Piece High Output Pouch with Spout Blue (mfg [id number])- lawson number [phone]) order 5      Accessories:   [employer]  Ring (mfg [id number]) - lawson number [phone]) order [address] Elastic Strip (mfg [id number]) - lawson number [phone]) order 10   [employer]enta ConvaTec Adhesive Remover Wipes (mfg [id number]) - lawson number [phone]) order 1 box   [employer] Stoma Powder (mfg [id number]) - lawson number [phone]) order 1   22M No-Sting Barrier Film- Pads (mfg [id number]) - lawson number ([id number]) order 1 box              Recommended Consults:  - Not Applicable    WOCN Follow Up:  - Weekly    Plan of Care Discussed With:   - Patient  - Family  - RN Primary    Supplies Ordered: No    Workup Time:   45 minutes    Ovidio Kin, RN, BSN, [place], CFCN

## 2024-03-23 NOTE — Unmapped (Signed)
 VENOUS ACCESS TEAM PROCEDURE    Nurse request was placed for a PIV by Venous Access Team (VAT).  Patient was assessed at bedside for placement of a PIV. PPE were donned per protocol.  Access was obtained. Blood return noted.  Dressing intact and device well secured.  Flushed with normal saline.  See LDA for details.  Pt advised to inform RN of any s/s of discomfort at the PIV site.  Pt has two 24g piv, one in each hand. Pt has poor venous access Primary RN notified  Workup / Procedure Time:  15 minutes       Primary RN was notified.       Thank you,     Richardine Service, RN Venous Access Team

## 2024-03-23 NOTE — Unmapped (Signed)
 Pt remains on HFNC tolerating well. VSS and no distress noted. Txs given as ordered will continue to monitor closely.

## 2024-03-23 NOTE — Unmapped (Signed)
 PHYSICAL THERAPY  Evaluation (03/23/24 1336)          Patient Name:  Tammy Braun       Medical Record Number: [id number]   Date of Birth: 02-Feb-1941  Sex: Female        Post-Discharge Physical Therapy Recommendations:  PT Post Acute Discharge Recommendations: 5x weekly, Low intensity   Equipment Recommendation  PT DME Recommendations: Defer to post acute     Treatment Diagnosis: Abnormalities of gait and mobility, Difficulty in walking, Generalized muscle weakness, Unsteadiness on feet        Activity Tolerance: Tolerated treatment well, Limited by fatigue     ASSESSMENT  Problem List: Gait deviation, Decreased mobility, Fall risk, Decreased endurance, Pain, Decreased safety awareness, Impaired judgement, Impaired balance, Decreased strength      Assessment : Tammy Braun is a 83 y.o. female with PMH bladder cancer with cystectomy and ileal conduit, COPD, recent dx of afib, moderate-severe TR, CAD (high calcification score), T2DM presented to [facility] with projectile vomiting and abdominal pain, found to have SBO with transition point at level of ileostomy on CT A/P. On initial presentation, labs remarkable for leukocytosis thought to be iso hemoconcentration, AKI on CKD, mildly elevated venous lactate improved with resuscitation. Admitted to [facility] for worsening hypoxia on HFNC requiring intubation in setting of hiatal hernia. OR on 02/28/24 for ex-lap, reduciton of parastomal hernia, small bowel resection with primary anastomosis and lysis of adhesions.  Patient seen now s/p extubation. Patient able to participate in bed mobility, sitting EOB tolerance, and STS transfers. Patient limited by decreased cognition, generalized weakness, and deconditioning. Patient perseverating on transfer to recliner, however requiring significant assist for standing and unable to progress to stepping. Patient will benefit from acute PT to address current deficits. Recommending 5x/weekly (low intensity) post-acute PT. Today's Interventions: PT eval. Graded activity as noted.  Patient educated regarding PT and POC, safety with mobility, importance of OOB mobility, progressive mobility, call don't fall. Reviewed HEP for BLE with daughter, educated on use of soft PRAFOs to reduce risk of PF contractures.     Personal Factors/Comorbidities Present: 3+   Examination of Body System: Musculoskeletal, Cardiovascular, Activity/participation, Pulmonary  Clinical Presentation: Unstable    Clinical Decision Making: High        PLAN  Planned Frequency of Treatment: Plan of Care Initiated: 03/23/24  1-2x per day Weekly Frequency: 3-4 days per week  Planned Treatment Duration: 04/06/24     Planned Interventions: Education (Patient/Family/Caregiver), Self-care / Home Management training, Gait training, Therapeutic Exercise, Therapeutic Activity     Goals:   Patient and Family Goals: To sit in the chair.     SHORT GOAL #1: Pt will complete bed mobility with min A.               Time Frame : 2 weeks  SHORT GOAL #2: Pt will complete OOB transfers with  min A and LRAD              Time Frame : 2 weeks  SHORT GOAL #3: Pt will ambulate 68ft with LRAD and CGA              Time Frame : 2 weeks                       Long Term Goal #1: Pt will ambulate 500 ft with mod-I and LRAD for community distances.  Time Frame: 2 months     Prognosis:  Fair  Positive Indicators: PLOF, family support  Barriers to Discharge: Functional strength deficits, Decreased range of motion, Decreased safety awareness, Endurance deficits, Impaired Balance, Gait instability, Cognitive deficits, Inaccessible home environment, Poor insight into deficits, Impulsivity     SUBJECTIVE  Communication Preference: Verbal        Patient reports: Pt agreeable to PT. Bring the chair over here Pt daughter present and supportive  Pain Comments: Pt reports abdominal pain, daughter patient and supportive throughout        Prior Functional Status: Per daughter, pt ambulates with RW in the home, cane on the stairs, and transport w/c when out in community.-initial eval.  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    Chronic kidney disease     Depression, psychotic (CMS-HCC)     Diabetes mellitus (CMS-HCC)     in past    Emphysema of lung (CMS-HCC)     Financial difficulties     Frail elderly     Hearing impairment     Hernia     History of transfusion     Hyperlipidemia     Hypertension     Impaired mobility     Osteoporosis     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/08/2015    Visual impairment             Social History     Tobacco Use    Smoking status: Every Day     Current packs/day: 1.00     Average packs/day: 1 pack/day for 66.0 years (66.0 ttl pk-yrs)     Types: Cigarettes     Start date: 04/09/1958    Smokeless tobacco: Never    Tobacco comments:     15cpd   Substance Use Topics    Alcohol use: No     Alcohol/week: 0.0 standard drinks of alcohol       Past Surgical History:   Procedure Laterality Date    ABDOMINAL SURGERY      BLADDER SURGERY      BREAST CYST EXCISION      CHEMOTHERAPY  2012    bladder    GALLBLADDER SURGERY      stone removal    ILEOSTOMY  2012    PR COLONOSCOPY FLX DX W/COLLJ SPEC WHEN PFRMD N/A 02/09/2015    Procedure: COLONOSCOPY, FLEXIBLE, PROXIMAL TO SPLENIC FLEXURE; DIAGNOSTIC, W/WO COLLECTION SPECIMEN BY BRUSH OR WASH;  Surgeon: Dewaine Conger, MD;  Location: [facility] [place];  Service: Gastroenterology    PR EXPLORATORY OF ABDOMEN N/A 02/28/2024    Procedure: EXPLORATORY LAPAROTOMY, EXPLORATORY CELIOTOMY WITH OR WITHOUT BIOPSY(S);  Surgeon: Renda Rolls, MD;  Location: [facility];  Service: General Surgery    PR RECONSTR TOTAL SHOULDER IMPLANT Left 04/08/2018    Procedure: ARTHROPLASTY, GLENOHUMERAL JOINT; TOTAL SHOULDER(GLENOID & PROXIMAL HUMERAL REPLACEMENT(EG, TOTAL SHOULDER);  Surgeon: Tomasa Rand, MD;  [facility]: [facility];  Service: Ortho Sports Medicine    PR REOPEN RECENT ABD EXPLORATORY Midline 03/01/2024    Procedure: REOPENING OF RECENT LAPAROTOMY;  Surgeon: [name]., MD;  Location: OR [facility];  Service: Trauma    PR REOPEN RECENT  ABD EXPLORATORY N/A 03/03/2024    Procedure: REOPENING OF RECENT LAPAROTOMY;  Surgeon: Joanie Coddington, MD;  Location: OR [facility];  Service: Trauma    PR SIGMOIDOSCOPY,BIOPSY N/A 03/11/2015    Procedure: SIGMOIDOSCOPY, FLEXIBLE; WITH BIOPSY, SINGLE OR MULTIPLE;  Surgeon: Wilburt Finlay, MD;  [facility];  Service: Gastroenterology    PR XCAPSL CTRC RMVL INSJ IO LENS PROSTH W/O ECP Left 06/05/2022    Procedure: EXTRACAPSULAR CATARACT REMOVAL W/INSERTION OF INTRAOCULAR LENS PROSTHESIS, MANUAL OR MECHANICAL TECHNIQUE WITHOUT ENDOSCOPIC CYCLOPHOTOCOAGULATION;  Surgeon: Garner Gavel, MD;  Location: [facility];  Service: Ophthalmology    PR XCAPSL CTRC RMVL INSJ IO LENS PROSTH W/O ECP Right 06/19/2022    Procedure: EXTRACAPSULAR CATARACT REMOVAL W/INSERTION OF INTRAOCULAR LENS PROSTHESIS, MANUAL OR MECHANICAL TECHNIQUE WITHOUT ENDOSCOPIC CYCLOPHOTOCOAGULATION;  Surgeon: Garner Gavel, MD;  Location: [facility];  Service: Ophthalmology             Family History   Problem Relation Age of Onset    Breast cancer Daughter 60    Diabetes Mother     Glaucoma Father     Colon cancer Neg Hx     Ovarian cancer Neg Hx     Endometrial cancer Neg Hx     Anesthesia problems Neg Hx     Bleeding Disorder Neg Hx         Allergies: Lisinopril, Losartan, and Hctz [hydrochlorothiazide]       Objective Findings:  Precautions / Restrictions  Precautions: Falls precautions, Isolation precautions (contact, abdominal)  Weight Bearing Status: Non-applicable  Required Braces or Orthoses: Non-applicable Equipment / Environment: Foley, NGT, Supplemental oxygen, Telemetry, Vascular access (PIV, TLC, Port-a-cath, PICC) (30% FiO2)     Vitals/Orthostatics : HR 105, SpO2 99%, BP 97/56 (70). HR to 123 with activity, SpO2 as low as 85%, however questionable pleth and improves with PLB and as pleth returns     Living Situation  Living Environment: House  Lives With: Daughter (40 yo grandson)  Home Living: One level home, Stairs to enter with rails, Tub/shower unit, Shower chair with back, Grab bars in shower, Hand-held shower hose, Handicapped height toilet  Rail placement (outside): Bilateral rails in reach  Number of Stairs to Enter (outside): 4  Caregiver Identified?: Yes  Caregiver Availability: 24 hours  Caregiver Ability: Limited lifting      Cognition: Follows 1-step commands  Cognition comment: Pt oriented with repeated questioning.  Requires repetition for command following. Pt tangential and with impulsivity throughout session, redirectable for periods  Visual/Perception: Wears Glasses/Contacts all the time  Hearing: Hearing impairment  Hearing: Daughter states pt is hard of hearing     Skin Inspection: Intact where visualized     Upper Extremities  UE ROM: Right WFL, Left WFL  UE Strength: Left Impaired/Limited, Right Impaired/Limited  RUE Strength Impairment: Reduced strength  LUE Strength Impairment: Reduced strength  UE comment: Generalized BUE weakness, grossly 3+/5    Lower Extremities  LE ROM: Right WFL, Left WFL  RLE Strength Impairment: Reduced strength  LLE Strength Impairment: Reduced strength  LE comment: Grossly 2/5 BLE, noted for 0/5 RLE DF          Coordination: WFL  Posture: Rounded shoulders, Trunk control impaired    Static Sitting-Level of Assistance: Minimum assistance  Dynamic Sitting-Level of Assistance: Moderate assistance    Static Standing-Level of Assistance: Dependent  Dynamic Standing - Level of Assistance: Dependent      Bed Mobility comments:  Supine>sit with maxA, Sit>supine totalA     Transfer comments: STS from EOB with maxAx2, B HHA, B knee buckling. Second attempt totalAx2, pt with B knee buckling requiring return to bed      Skilled Treatment Performed: Unable to attempt, pt cued for stepping, unable to clear or progress either foot     Stairs: NT       Endurance: Fair    Patient at end of session: All needs in reach, Friends/Family present, In bed, Lines intact, Notified Nurse    AM-PAC-6 click  Help currently need turning over In bed?: A lot - Maximum/Moderate Assistance  Help currently needed sitting down/standing up from chair with arms? : Unable to do/total assistance - Total Dependent Assist  Help currently needed moving from supine to sitting on edge of bed?: A lot - Maximum/Moderate Assistance  Help currently needed moving to and from bed from wheelchair?: Unable to do/total assistance - Total Dependent Assist  Help currently needed walking in a hospital room?: Unable to do/total assistance - Total Dependent Assist  Help currently needed climbing 3-5 steps with railing?: Unable to do/total assistance - Total Dependent Assist    Basic Mobility Score 6 click: 8    6 click Score (in points): % of Functional Impairment, Limitation, Restriction  6: 100% impaired, limited, restricted  7-8: At least 80%, but less than 100% impaired, limited restricted  9-13: At least 60%, but less than 80% impaired, limited restricted  14-19: At least 40%, but less than 60% impaired, limited restricted  20-22: At least 20%, but less than 40% impaired, limited restricted  23: At least 1%, but less than 20% impaired, limited restricted  24: 0% impaired, limited restricted    'AM-PAC' forms are Copyright protected by [employer]     A student was present and participated in the care. Licensed/Credentialed therapist was physically present and immediately available to direct and supervise tasks that were related to patient management. The direction and supervision was continuous throughout the time these tasks were performed.      Physical Therapy Session Duration  PT Individual [mins]: 44      I attest that I have reviewed the above information.  Signed: Faythe Dingwall, PT  Filed 03/23/2024

## 2024-03-23 NOTE — Unmapped (Signed)
 [facility] Home Health Order [id number] printed and placed in your box for signature

## 2024-03-23 NOTE — Unmapped (Signed)
 [facility] PROGRESS NOTE     Date of Service: 03/23/2024    Hospital Day: LOS: 25 days        Surgery Date: TBD   Surgical Attending: Lawrence Marseilles Day Thurnell Lose, MD    Critical Care Attending: Lia Hopping, MD    Interval History:   Deferred removing CVC overnight given VAT only able to place a 24ga PIV in the hand. Will discuss PICC vs new CVC in AM if unable to secure larger bore PIV.     MAPs borderline overnight while patient sleeping.     TF's advancing to goal, patient tolerating well.    Gave lasix 40mg  x1 overnight give pt close to net even with goal net -1L.  Cr 1.32(1.35), CTM. Increase in liquid stools overnight, will consider sending for cdiff if new fever or worsening leukocytosis.     History of Present Illness:   Tammy Braun is a 83 y.o. female with PMH bladder cancer with cystectomy and ileal conduit, COPD, recent dx of afib, moderate-severe TR, CAD (high calcification score), T2DM presented to [facility] with projectile vomiting and abdominal pain, found to have SBO with transition point at level of ileostomy on CT A/P. On initial presentation, labs remarkable for leukocytosis thought to be iso hemoconcentration, AKI on CKD, mildly elevated venous lactate improved with resuscitation. Admitted to [facility] for worsening hypoxia on HFNC requiring intubation in setting of hiatal hernia. OR on 02/28/24 for ex-lap, reduciton of parastomal hernia, small bowel resection with primary anastomosis and lysis of adhesions.    Hospital Course:  - 02/26/24: Admitted to [facility], floor status, consult to urology for possible ileal conduit revision.   - 02/27/24: Patient level of care escalated to step-down iso increased oxygen needs while in ED.  - 02/28/24: Upgraded to ICU. Intubated at bed-side. OR for ex-lap, reduction of parastomal hernia, small bowel resection with primary anastomosis, lysis of adhesions, abdomen left open with ABThera in place.   - 03/01/24: OR for attempt at abdominal closure. Left open due to desaturations during closure attempt. ABThera in place.  - 03/03/24: RTOR for abdominal closure  -03/06/24: Vas cath placed, CRRT started   -03/09/24: Extubated to HFNC  -03/10/24: CRRT discontinued   - 03/11/24: Re-intubated for hypoxic respiratory failure 2/2 mucus plug   - 03/14/24 Extubated to HFNC   -03/15/24: Vas cath removed   -03/16/24: Placed on BiPap for complete collapse of L lung 2/2 probable mucus plugging  -03/19/24: Re-intubated for mucus plugging. Therapeutic bronch, BAL sent.      ASSESSMENT & PLAN:     Neurologic:  - Pain: tylenol SCH    #schizoaffective disorder  - Monthly prolixin injection (given 3/14)    Cardiovascular:  - MAP goal > 65  - NE gtt, wean as able    #HFpEF  #Pulm HTN  - ECHO 3/12- LVEF 70%, moderately dilated RV with moderate systolic dysfunction, severe pulmonary hypertension.    #Hx of HTN  -holding home amlodipine iso hypotension    #Hx of HLD  - atorvastatin    #Afib/flutter w RVR:   - Prior hx of paroxysmal afib. CHADSvasc = 6, no AC PTA per chart review.      > Holding home diltiazem, question efficacy given likely poor absorption  - Amio via NG tube 200mg  daily   - heparin gtt for [facility]     #Pulmonary embolism  - 3/5 CTA chest: Filling defect in L anterior segmental artery  - heparin gtt     #Acute  thrombus right basilic   - Identified 3/3 on BUE duplex  - Supportive care, superficial     Respiratory:  #Acute on chronic hypoxemia  #Hx of COPD  - Aggressive pulmonary toilet  - Continue Brovana & atrovent nebs  - Continue hypertonic saline nebs for clearance     #Aspiration  #Poor pulmonary clearance   - Re-intubated 3/21 for mucous plugging of L lung and inability to clear secretions  - Extubated 3/23, now on HFNC     #Pseudomonas Pneumonia, resolved  #VAP  - Completed antibiotics course    FEN/GI:  F: ML  E: Replete electrolytes PRN  N: NPO, TF via [place]  - GI prophylaxis: protonix  - Bowel regimen: miralax and senna BID   - Last BM: 3/23    #Hypernatremia  - Increase FWF to 75mL q1h     #S/p ex-lap, reduction of parastomal hernia, small bowel resection with primary anastomosis, lysis of adhesions  - CT 3/13 demonstrating partial small bowel obstruction  - Blake drain #2 removed 3/16, Blake drain #1 removed 3/17  - WTD dressing changes daily per nursing to midline incision    > midline incision drainage culture with 1+ candida     #Large hiatal hernia, chronic  - re-eval on CT chest/abd/pelvis 3/13 stable    Renal/Genitourinary:  #AKI on CKD  - Nephrology signed off, HD cath removed  - Lasix 40mg  x1 overnight, awaiting I&O update by nursing     #H/o bladder cancer s/p radical cystectomy with ileal conduit (2012):  #Parastomal hernia:  - Strict I&Os  - Ok for no foley in stoma at this time per urology    Endocrine:   #hyperglycemia   - Glycemic Control: SSI, continue NPH 15 unit BID    Hematologic:   - Hgb stable  - Monitor CBC daily, transfuse for hgb 7mg /dL  - DVT ppx: heparin gtt for PE    Immunologic/Infectious Disease:  - Afebrile with resolving leukocytosis     - Cultures:   - Blood cultures (3/4): negative, final   - BAL (3/4): Pseudomonas aeruginosa - susceptible to meropenum    - BAL (3/8): Pseudomonas aeruginosa and candida glabrata  -3/12: Susceptibilities show resistance to Meropenum, Cefepime, Ceftaz and Zosyn.   - Bcx (3/11): Negative, final  - BAL (3/12): 600,000+ psuedomonas (susceptibilities: resistant to meropenum)  - Ucx (3/13): no growth  - BAL (3/20): <10,000 CFU    - Antimicrobials:    - Ceftriaxone 2/27- 2/28 - colonized UTI   - Zosyn (3/1 - 3/5) for abdominal contamination  - Zosyn (3/6) for pseudomonas when sensitivities remained pending  - Zerbaxa (3/12-3/21) for 10 day course    Musculoskeletal:   #Deconditioned   - PT/OT consulted     Daily Care Checklist:   - Stress Ulcer Prevention: No  - DVT Prophylaxis: Chemical: Heparin drip  - Daily Awakening: N/A  - Spontaneous Breathing Trial: N/A  - Indication for Central/PICC Line: Yes  Infusions requiring central access, Hemodynamic monitoring, and Inadequate peripheral access  - Indication for Urinary Catheter: No   - Diagnostic images/reports of past 24hrs reviewed: Yes    Disposition:   - Continue ICU care  - PT/OT consulted:yes    Tammy Braun is critically ill due to acute respiratory failure, hypoxemia, fluid overload, COPD, SBO, prolonged NPO, malnutrition, ileostomy, parastomal hernia, small bowel resection. This critical care time includes examining the patient, evaluating the hemodynamic, laboratory, and radiographic data, independently developing a comprehensive management plan, and  serially assessing the patient's response to these critical care interventions. This critical care time excludes procedures.     Critical care time: 30 minutes     Ernst Bowler D'Errico, PA   Surgical Intensive Care Unit  [facility] [place] [facility]       SUBJECTIVE:      Resting, no complaints     OBJECTIVE:     Physical Exam:  Constitutional: Laying in bed, chronically ill appearing  Neurologic: AAOX2 self/place, extremities to command, non-focal  Respiratory: nWOB on HFNC, satting upper 90's  Cardiovascular:  Irregularly irregular rhythm   Gastrointestinal: Soft abdomen, midline incision staples in tact, WTD packing in place. Urostomy draining clear yellow urine.   Musculoskeletal: Trace BLE/BUE edema.  Skin: Warm and dry     Temp:  [36.4 ??C (97.5 ??F)-37.2 ??C (98.9 ??F)] 36.4 ??C (97.5 ??F)  Heart Rate:  [102-129] 105  SpO2 Pulse:  [77-165] 104  Resp:  [15-30] 18  BP: (67-137)/(36-90) 97/63  MAP (mmHg):  [51-104] 73  FiO2 (%):  [30 %] 30 %  SpO2:  [92 %-100 %] 95 %     Recent Laboratory Results:  No results for input(s): SPECTYPEART, PHART, PCO2ART, PO2ART, HCO3ART, BEART, O2SATART in the last 24 hours.    Recent Labs     Units 03/22/24  1806 03/23/24  0528 03/23/24  1552   NA mmol/L 151* 151* 151*   K mmol/L 4.1 4.1 3.2*   CL mmol/L 110* 111* 114*   CO2 mmol/L 32.0* 28.0 29.0   BUN mg/dL 67* 63* 65*   CREATININE mg/dL 1.61* 0.96* 0.45*   GLU mg/dL 409 811 74     Lab Results   Component Value Date    BILITOT 0.3 03/22/2024    BILITOT 0.3 03/15/2024    BILIDIR 0.10 03/22/2024    BILIDIR 0.20 03/15/2024    ALT <7 (L) 03/22/2024    ALT 10 03/15/2024    AST 9 03/22/2024    AST 13 03/15/2024    GGT 16 09/17/2011    GGT 354 (H) 05/03/2011    ALKPHOS 70 03/22/2024    ALKPHOS 60 03/15/2024    PROT 6.5 03/22/2024    PROT 5.7 03/15/2024    ALBUMIN 2.0 (L) 03/22/2024    ALBUMIN 1.9 (L) 03/15/2024     Recent Labs     Units 03/23/24  0640 03/23/24  1308 03/23/24  1852 03/23/24  1913   POCGLU mg/dL 914 63* 59* 782     Recent Labs     Units 03/21/24  0356 03/22/24  0444 03/23/24  0528   WBC 10*9/L 12.8* 11.5* 10.0   RBC 10*12/L 2.67* 2.78* 2.66*   HGB g/dL 7.4* 7.6* 7.9*   HCT % 23.0* 24.0* 24.3*   MCV fL 85.9 86.1 91.3   MCH pg 27.6 27.5 29.5   MCHC g/dL 95.6 21.3* 08.6   RDW % 17.9* 18.2* 18.7*   PLT 10*9/L 244 268 254   MPV fL 9.9 9.9 9.8     Recent Labs     Units 03/23/24  0528   APTT sec 48.3*       Lines & Tubes:   Patient Lines/Drains/Airways Status       Active Peripheral & Central Intravenous Access       Name Placement date Placement time Site Days    Peripheral IV 03/22/24 Left;Posterior Hand 03/22/24  1556  Hand  1    Peripheral IV 03/23/24 Posterior;Right Hand 03/23/24  1755  Hand  less than 1    CVC Triple Lumen 03/07/24 Non-tunneled Left Internal jugular 03/07/24  0849  Internal jugular  16                     Patient Lines/Drains/Airways Status       Active Wounds       Name Placement date Placement time Site Days    Surgical Site 04/08/18 Shoulder Left 04/08/18  1338  -- 2176    Surgical Site 06/05/22 Eye Left 06/05/22  0928  -- 657    Surgical Site 06/19/22 Eye Right 06/19/22  1024  -- 643    Surgical Site 03/03/24 Abdomen 03/03/24  1013  -- 20    Wound 06/24/20 Other (comment) Buttocks inner right buttock 06/24/20  1010  Buttocks  1368    Wound 03/13/24 Neck Right open area note arount the HD catheter durng the dressing change 03/13/24  0930  Neck  10                     Respiratory/ventilator settings for last 24 hours:   FiO2 (%): 30 %    Intake/Output last 3 shifts:  I/O last 3 completed shifts:  In: 810.7 [P.O.:100; I.V.:430.7; NG/GT:280]  Out: 2145 [Urine:2145]    Daily/Recent Weight:  68.8 kg (151 lb 10.8 oz)    BMI:  Body mass index is 28.67 kg/m??.    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    Chronic kidney disease     Depression, psychotic (CMS-HCC)     Diabetes mellitus (CMS-HCC)     in past    Emphysema of lung (CMS-HCC)     Financial difficulties     Frail elderly     Hearing impairment     Hernia     History of transfusion     Hyperlipidemia     Hypertension     Impaired mobility     Osteoporosis     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/08/2015    Visual impairment      Past Surgical History:   Procedure Laterality Date    ABDOMINAL SURGERY      BLADDER SURGERY      BREAST CYST EXCISION      CHEMOTHERAPY  2012    bladder    GALLBLADDER SURGERY      stone removal    ILEOSTOMY  2012    PR COLONOSCOPY FLX DX W/COLLJ SPEC WHEN PFRMD N/A 02/09/2015    Procedure: COLONOSCOPY, FLEXIBLE, PROXIMAL TO SPLENIC FLEXURE; DIAGNOSTIC, W/WO COLLECTION SPECIMEN BY BRUSH OR WASH;  Surgeon: Dewaine Conger, MD;  Location: [facility] [place];  Service: Gastroenterology    PR EXPLORATORY OF ABDOMEN N/A 02/28/2024    Procedure: EXPLORATORY LAPAROTOMY, EXPLORATORY CELIOTOMY WITH OR WITHOUT BIOPSY(S);  Surgeon: Renda Rolls, MD;  Location: [facility];  Service: General Surgery    PR RECONSTR TOTAL SHOULDER IMPLANT Left 04/08/2018    Procedure: ARTHROPLASTY, GLENOHUMERAL JOINT; TOTAL SHOULDER(GLENOID & PROXIMAL HUMERAL REPLACEMENT(EG, TOTAL SHOULDER);  Surgeon: Tomasa Rand, MD;  Location: [facility] OR [facility];  Service: Ortho Sports Medicine    PR REOPEN RECENT ABD EXPLORATORY Midline 03/01/2024  Procedure: REOPENING OF RECENT LAPAROTOMY;  Surgeon: [name]., MD;  Location: OR [facility];  Service: Trauma    PR REOPEN RECENT ABD EXPLORATORY N/A 03/03/2024    Procedure: REOPENING OF RECENT LAPAROTOMY;  Surgeon: Joanie Coddington, MD;  Location: OR [facility];  Service: Trauma    PR SIGMOIDOSCOPY,BIOPSY N/A 03/11/2015    Procedure: SIGMOIDOSCOPY, FLEXIBLE; WITH BIOPSY, SINGLE OR MULTIPLE;  Surgeon: Wilburt Finlay, MD;  [facility];  Service: Gastroenterology    PR XCAPSL CTRC RMVL INSJ IO LENS PROSTH W/O ECP Left 06/05/2022    Procedure: EXTRACAPSULAR CATARACT REMOVAL W/INSERTION OF INTRAOCULAR LENS PROSTHESIS, MANUAL OR MECHANICAL TECHNIQUE WITHOUT ENDOSCOPIC CYCLOPHOTOCOAGULATION;  Surgeon: Garner Gavel, MD;  Location: [facility] OR [facility];  Service: Ophthalmology    PR XCAPSL CTRC RMVL INSJ IO LENS PROSTH W/O ECP Right 06/19/2022    Procedure: EXTRACAPSULAR CATARACT REMOVAL W/INSERTION OF INTRAOCULAR LENS PROSTHESIS, MANUAL OR MECHANICAL TECHNIQUE WITHOUT ENDOSCOPIC CYCLOPHOTOCOAGULATION;  Surgeon: Garner Gavel, MD;  Location: [facility] OR [facility];  Service: Ophthalmology     Scheduled Medications:   acetaminophen  1,000 mg Enteral tube: gastric Q8H SCH    amiodarone  200 mg Enteral tube: gastric Daily    arformoterol  15 mcg Nebulization BID (RT)    aspirin  81 mg Enteral tube: gastric Daily    atorvastatin  40 mg Enteral tube: gastric Daily    carboxymethylcellulose sodium  2 drop Both Eyes TID    [START ON 03/24/2024] cholecalciferol  50 mcg Oral Daily    esomeprazole  40 mg Enteral tube: gastric daily    flu vacc [id number](41yr up)-PF  0.5 mL Intramuscular During hospitalization    fluPHENAZine decanoate  50 mg Intramuscular Q30 Days    [START ON 03/24/2024] furosemide  40 mg Intravenous Once    insulin NPH  15 Units Subcutaneous BID AC    insulin regular  0-20 Units Subcutaneous Q6H SCH    ipratropium  500 mcg Nebulization Q6H (RT)    methocarbamol  500 mg Enteral tube: gastric QID    polyethylene glycol  17 g Enteral tube: gastric BID    sennosides  2.5 mL Enteral tube: gastric BID    sodium chloride  10 mL Intravenous Q8H    sodium chloride  10 mL Intravenous Q8H    sodium chloride  10 mL Intravenous Q8H    sodium chloride  4 mL Nebulization Q6H (RT)     Continuous Infusions:   heparin 16 Units/kg/hr (03/23/24 2200)     PRN Medications:  albuterol, calcium gluconate, calcium gluconate, dextrose in water, fentaNYL (PF) **OR** fentaNYL (PF), heparin (porcine), heparin (porcine), magnesium sulfate in water, melatonin, nicotine polacrilex **OR** nicotine polacrilex, ondansetron, phenol, potassium chloride in water

## 2024-03-23 NOTE — Unmapped (Signed)
 Palliative Care Progress Note      Consultation from Requesting Attending Physician:  Tammy Marseilles Day Thurnell Lose, MD  Primary Care Provider:  Vanetta Shawl, MD    Assessment/Plan:      SUMMARY: This 83 y.o. patient is seriously and acutely ill due to small bowel obstruction, complicated by co-morbid acute and chronic conditions including acute hypoxic respiratory failure requiring intubation x2, aspiration, hiatal hernia, s/p OR on 02/28/24 for ex-lap, reduciton of parastomal hernia, small bowel resection with primary anastomosis and lysis of adhesions, AKI on CKD, atrial fibrillation, pulmonary embolism, schizoaffective disorder, HFpEF, pulmonary hypertension, HTN, COPD, bladder cancer with cystectomy and ileal conduit, moderate-severe TR, CAD (high calcification score), T2DM.     Today's visit addressed support    Symptom Assessment and Recommendations:    None needed at this time    Goals of care / ACP:  Code Status:   Code Status: Full Code   Healthcare decision-maker if lacks capacity:   HCDM (patient stated preference) (Active): Tammy Braun - Daughter - [phone]      Current goals of care as discussed on 3/24: Continue current medical management. Currently extubated to HFNC, re-intubate if needed but patient and family decided on no tracheostomy. Discussion regarding PEG tube ongoing, currently has CorPak in place for nutrition.        Practical, Emotional, Spiritual Support Recommendations:  Palliative care visit today included focused interview, active listening, therapeutic use of silence, offering support, sharing empathy and summarization of today's discussion.      - Communicated with primary team in person        Thank you for this consult. Please contact Tammy Beams, PA-C or page Palliative Care if there are any questions.     Subjective:     Recent Events: Required intubation at end of last week and underwent bronchoscopy for mucous plugging. She was able to be extubated yesterday. CorPak placed after NG tube malfunction. [name] is motivated to work with PT/OT today.      Objective:     Function:  40% - Ambulation: Mainly bed / Unable to do any work, extensive disease / Self-Care:M [employer] / Intake: Normal or reduced / Level of Conscious: Full , drowsy, or confusion    Temp:  [36.6 ??C (97.9 ??F)-37.2 ??C (98.9 ??F)] 36.6 ??C (97.9 ??F)  Heart Rate:  [102-129] 106  SpO2 Pulse:  [77-165] 126  Resp:  [10-30] 21  BP: (84-137)/(36-90) 107/58  FiO2 (%):  [30 %-40 %] 30 %  SpO2:  [91 %-100 %] 92 %    Physical Exam:  Constitutional: chronically ill-appearing female in no acute distress lying in bed with daughter present   Eyes: anicteric sclera, no discharge  ENMT: mucous membranes moist; CorPak in place  Pulm: Breathing comfortably on HFNC   Abd: Non-distended  MS: (+) sarcopenia  Skin: Warm, dry  Neuro: Alert; able to converse and state desires for water swabs and working with therapy  Psych: mood and affect euthymic    Testing reviewed and interpreted:   Reviewed and interpreted test results for BMP with elevated Cr affecting assessment of underlying illness severity and prognosis    I personally spent 30 minutes face-to-face and non-face-to-face in the care of this patient, which includes all pre, intra, and post visit time on the date of service.  All documented time was specific to the E/M visit and does not include any procedures that may have been performed.     See ACP Note  from today for additional billable service:  No.     Tammy Beams, PA-C  Physician Assistant  Palliative Care

## 2024-03-23 NOTE — Unmapped (Signed)
 PCP is not planning to respond to Urology request

## 2024-03-23 NOTE — Unmapped (Signed)
 Problem: Adult Inpatient Plan of Care  Goal: Plan of Care Review  Outcome: Ongoing - Unchanged  Goal: Patient-Specific Goal (Individualized)  Outcome: Ongoing - Unchanged  Goal: Absence of Hospital-Acquired Illness or Injury  Outcome: Ongoing - Unchanged  Intervention: Identify and Manage Fall Risk  Recent Flowsheet Documentation  Taken 03/23/2024 0600 by Jule Ser, RN  Safety Interventions:   bed alarm   lighting adjusted for tasks/safety  Taken 03/23/2024 0400 by Jule Ser, RN  Safety Interventions:   bed alarm   lighting adjusted for tasks/safety  Taken 03/23/2024 0200 by Jule Ser, RN  Safety Interventions:   bed alarm   lighting adjusted for tasks/safety  Taken 03/23/2024 0000 by Jule Ser, RN  Safety Interventions:   bed alarm   lighting adjusted for tasks/safety  Taken 03/22/2024 2206 by Jule Ser, RN  Safety Interventions:   bed alarm   lighting adjusted for tasks/safety  Taken 03/22/2024 2009 by Jule Ser, RN  Safety Interventions:   bed alarm   lighting adjusted for tasks/safety  Intervention: Prevent Skin Injury  Recent Flowsheet Documentation  Taken 03/23/2024 0600 by Jule Ser, RN  Positioning for Skin: Right  Device Skin Pressure Protection:   absorbent pad utilized/changed   adhesive use limited   tubing/devices free from skin contact  Skin Protection:   adhesive use limited   transparent dressing maintained   tubing/devices free from skin contact  Taken 03/23/2024 0400 by Jule Ser, RN  Positioning for Skin: Left  Device Skin Pressure Protection:   absorbent pad utilized/changed   adhesive use limited   tubing/devices free from skin contact  Skin Protection:   adhesive use limited   transparent dressing maintained   tubing/devices free from skin contact  Taken 03/23/2024 0200 by Jule Ser, RN  Positioning for Skin: Right  Device Skin Pressure Protection:   absorbent pad utilized/changed   adhesive use limited   tubing/devices free from skin contact  Skin Protection:   adhesive use limited   transparent dressing maintained   tubing/devices free from skin contact  Taken 03/23/2024 0000 by Jule Ser, RN  Positioning for Skin: Left  Device Skin Pressure Protection:   absorbent pad utilized/changed   adhesive use limited   tubing/devices free from skin contact  Skin Protection:   adhesive use limited   transparent dressing maintained   tubing/devices free from skin contact  Taken 03/22/2024 2206 by Jule Ser, RN  Positioning for Skin: Right  Device Skin Pressure Protection:   absorbent pad utilized/changed   adhesive use limited   tubing/devices free from skin contact  Skin Protection:   adhesive use limited   transparent dressing maintained   tubing/devices free from skin contact  Taken 03/22/2024 2009 by Jule Ser, RN  Positioning for Skin: Left  Device Skin Pressure Protection:   absorbent pad utilized/changed   adhesive use limited   tubing/devices free from skin contact  Skin Protection:   adhesive use limited   transparent dressing maintained   tubing/devices free from skin contact  Taken 03/22/2024 2000 by Jule Ser, RN  Positioning for Skin: Right  Device Skin Pressure Protection:   absorbent pad utilized/changed   adhesive use limited   tubing/devices free from skin contact  Skin Protection:   adhesive use limited   transparent dressing maintained   tubing/devices free from skin contact  Intervention: Prevent and Manage VTE (Venous Thromboembolism) Risk  Recent Flowsheet Documentation  Taken 03/23/2024 0600 by Jule Ser, RN  Anti-Embolism Device Type: SCD, Knee  Anti-Embolism  Device Status: On  Anti-Embolism Device Location: BLE  Taken 03/23/2024 0400 by Jule Ser, RN  Anti-Embolism Device Type: SCD, Knee  Anti-Embolism Device Status: On  Anti-Embolism Device Location: BLE  Taken 03/23/2024 0200 by Jule Ser, RN  Anti-Embolism Device Type: SCD, Knee  Anti-Embolism Device Status: On  Anti-Embolism Device Location: BLE  Taken 03/23/2024 0000 by Jule Ser, RN  Anti-Embolism Device Type: SCD, Knee  Anti-Embolism Device Status: On  Anti-Embolism Device Location: BLE  Taken 03/22/2024 2206 by Jule Ser, RN  Anti-Embolism Device Type: SCD, Knee  Anti-Embolism Device Status: On  Anti-Embolism Device Location: BLE  Taken 03/22/2024 2009 by Jule Ser, RN  Anti-Embolism Device Type: SCD, Knee  Anti-Embolism Device Status: On  Anti-Embolism Device Location: BLE  Taken 03/22/2024 2000 by Jule Ser, RN  Anti-Embolism Device Type: SCD, Knee  Anti-Embolism Device Status: On  Anti-Embolism Device Location: BLE  Intervention: Prevent Infection  Recent Flowsheet Documentation  Taken 03/23/2024 0600 by Jule Ser, RN  Infection Prevention:   hand hygiene promoted   personal protective equipment utilized  Taken 03/23/2024 0400 by Jule Ser, RN  Infection Prevention:   hand hygiene promoted   personal protective equipment utilized  Taken 03/23/2024 0200 by Jule Ser, RN  Infection Prevention:   hand hygiene promoted   personal protective equipment utilized  Taken 03/23/2024 0000 by Jule Ser, RN  Infection Prevention:   hand hygiene promoted   personal protective equipment utilized  Taken 03/22/2024 2206 by Jule Ser, RN  Infection Prevention:   hand hygiene promoted   personal protective equipment utilized  Taken 03/22/2024 2009 by Jule Ser, RN  Infection Prevention:   hand hygiene promoted   personal protective equipment utilized  Goal: Optimal Comfort and Wellbeing  Outcome: Ongoing - Unchanged  Goal: Readiness for Transition of Care  Outcome: Ongoing - Unchanged  Goal: Rounds/Family Conference  Outcome: Ongoing - Unchanged     Problem: Infection  Goal: Absence of Infection Signs and Symptoms  Outcome: Ongoing - Unchanged  Intervention: Prevent or Manage Infection  Recent Flowsheet Documentation  Taken 03/23/2024 0600 by Jule Ser, RN  Infection Management: aseptic technique maintained  Isolation Precautions: contact precautions maintained  Taken 03/23/2024 0400 by Jule Ser, RN  Infection Management: aseptic technique maintained  Isolation Precautions: contact precautions maintained  Taken 03/23/2024 0200 by Jule Ser, RN  Infection Management: aseptic technique maintained  Isolation Precautions: contact precautions maintained  Taken 03/23/2024 0000 by Jule Ser, RN  Infection Management: aseptic technique maintained  Isolation Precautions: contact precautions maintained  Taken 03/22/2024 2206 by Jule Ser, RN  Infection Management: aseptic technique maintained  Isolation Precautions: contact precautions maintained  Taken 03/22/2024 2009 by Jule Ser, RN  Infection Management: aseptic technique maintained  Isolation Precautions: contact precautions maintained     Problem: Fall Injury Risk  Goal: Absence of Fall and Fall-Related Injury  Outcome: Ongoing - Unchanged  Intervention: Promote Injury-Free Environment  Recent Flowsheet Documentation  Taken 03/23/2024 0600 by Jule Ser, RN  Safety Interventions:   bed alarm   lighting adjusted for tasks/safety  Taken 03/23/2024 0400 by Jule Ser, RN  Safety Interventions:   bed alarm   lighting adjusted for tasks/safety  Taken 03/23/2024 0200 by Jule Ser, RN  Safety Interventions:   bed alarm   lighting adjusted for tasks/safety  Taken 03/23/2024 0000 by Jule Ser, RN  Safety Interventions:   bed alarm   lighting adjusted for tasks/safety  Taken 03/22/2024 2206 by Jule Ser, RN  Safety Interventions:  bed alarm   lighting adjusted for tasks/safety  Taken 03/22/2024 2009 by Jule Ser, RN  Safety Interventions:   bed alarm   lighting adjusted for tasks/safety     Problem: Wound  Goal: Optimal Coping  Outcome: Ongoing - Unchanged  Goal: Optimal Functional Ability  Outcome: Ongoing - Unchanged  Intervention: Optimize Functional Ability  Recent Flowsheet Documentation  Taken 03/23/2024 0600 by Jule Ser, RN  Activity Management: bedrest  Taken 03/23/2024 0400 by Jule Ser, RN  Activity Management: bedrest  Taken 03/23/2024 0200 by Jule Ser, RN  Activity Management: bedrest  Taken 03/23/2024 0000 by Jule Ser, RN  Activity Management: bedrest  Taken 03/22/2024 2206 by Jule Ser, RN  Activity Management: bedrest  Taken 03/22/2024 2009 by Jule Ser, RN  Activity Management: bedrest  Goal: Absence of Infection Signs and Symptoms  Outcome: Ongoing - Unchanged  Intervention: Prevent or Manage Infection  Recent Flowsheet Documentation  Taken 03/23/2024 0600 by Jule Ser, RN  Infection Management: aseptic technique maintained  Isolation Precautions: contact precautions maintained  Taken 03/23/2024 0400 by Jule Ser, RN  Infection Management: aseptic technique maintained  Isolation Precautions: contact precautions maintained  Taken 03/23/2024 0200 by Jule Ser, RN  Infection Management: aseptic technique maintained  Isolation Precautions: contact precautions maintained  Taken 03/23/2024 0000 by Jule Ser, RN  Infection Management: aseptic technique maintained  Isolation Precautions: contact precautions maintained  Taken 03/22/2024 2206 by Jule Ser, RN  Infection Management: aseptic technique maintained  Isolation Precautions: contact precautions maintained  Taken 03/22/2024 2009 by Jule Ser, RN  Infection Management: aseptic technique maintained  Isolation Precautions: contact precautions maintained  Goal: Improved Oral Intake  Outcome: Ongoing - Unchanged  Goal: Optimal Pain Control and Function  Outcome: Ongoing - Unchanged  Intervention: Prevent or Manage Pain  Recent Flowsheet Documentation  Taken 03/23/2024 0600 by Jule Ser, RN  Sleep/Rest Enhancement: awakenings minimized  Taken 03/23/2024 0400 by Jule Ser, RN  Sleep/Rest Enhancement: awakenings minimized  Taken 03/23/2024 0200 by Jule Ser, RN  Sleep/Rest Enhancement: awakenings minimized  Taken 03/23/2024 0000 by Jule Ser, RN  Sleep/Rest Enhancement: awakenings minimized  Taken 03/22/2024 2206 by Jule Ser, RN  Sleep/Rest Enhancement: awakenings minimized  Taken 03/22/2024 2009 by Jule Ser, RN  Sleep/Rest Enhancement: awakenings minimized  Goal: Skin Health and Integrity  Outcome: Ongoing - Unchanged  Intervention: Optimize Skin Protection  Recent Flowsheet Documentation  Taken 03/23/2024 0600 by Jule Ser, RN  Activity Management: bedrest  Pressure Reduction Techniques:   heels elevated off bed   frequent weight shift encouraged  Head of Bed (HOB) Positioning: HOB at 30-45 degrees  Pressure Reduction Devices: pressure-redistributing mattress utilized  Skin Protection:   adhesive use limited   transparent dressing maintained   tubing/devices free from skin contact  Taken 03/23/2024 0400 by Jule Ser, RN  Activity Management: bedrest  Pressure Reduction Techniques:   heels elevated off bed   frequent weight shift encouraged  Head of Bed (HOB) Positioning: HOB at 30-45 degrees  Pressure Reduction Devices: pressure-redistributing mattress utilized  Skin Protection:   adhesive use limited   transparent dressing maintained   tubing/devices free from skin contact  Taken 03/23/2024 0200 by Jule Ser, RN  Activity Management: bedrest  Pressure Reduction Techniques:   heels elevated off bed   frequent weight shift encouraged  Head of Bed (HOB) Positioning: HOB at 30-45 degrees  Pressure Reduction Devices: pressure-redistributing mattress utilized  Skin Protection:   adhesive use limited   transparent dressing maintained   tubing/devices free  from skin contact  Taken 03/23/2024 0000 by Jule Ser, RN  Activity Management: bedrest  Pressure Reduction Techniques:   heels elevated off bed   frequent weight shift encouraged  Head of Bed (HOB) Positioning: HOB at 30-45 degrees  Pressure Reduction Devices: pressure-redistributing mattress utilized  Skin Protection:   adhesive use limited   transparent dressing maintained   tubing/devices free from skin contact  Taken 03/22/2024 2206 by Jule Ser, RN  Activity Management: bedrest  Pressure Reduction Techniques:   heels elevated off bed   frequent weight shift encouraged  Head of Bed (HOB) Positioning: HOB at 30-45 degrees  Pressure Reduction Devices: pressure-redistributing mattress utilized  Skin Protection:   adhesive use limited   transparent dressing maintained   tubing/devices free from skin contact  Taken 03/22/2024 2009 by Jule Ser, RN  Activity Management: bedrest  Pressure Reduction Techniques:   heels elevated off bed   frequent weight shift encouraged  Head of Bed (HOB) Positioning: HOB at 30-45 degrees  Pressure Reduction Devices: pressure-redistributing mattress utilized  Skin Protection:   adhesive use limited   transparent dressing maintained   tubing/devices free from skin contact  Taken 03/22/2024 2000 by Jule Ser, RN  Pressure Reduction Techniques:   heels elevated off bed   frequent weight shift encouraged  Pressure Reduction Devices: pressure-redistributing mattress utilized  Skin Protection:   adhesive use limited   transparent dressing maintained   tubing/devices free from skin contact  Goal: Optimal Wound Healing  Outcome: Ongoing - Unchanged  Intervention: Promote Wound Healing  Recent Flowsheet Documentation  Taken 03/23/2024 0600 by Jule Ser, RN  Sleep/Rest Enhancement: awakenings minimized  Taken 03/23/2024 0400 by Jule Ser, RN  Sleep/Rest Enhancement: awakenings minimized  Taken 03/23/2024 0200 by Jule Ser, RN  Sleep/Rest Enhancement: awakenings minimized  Taken 03/23/2024 0000 by Jule Ser, RN  Sleep/Rest Enhancement: awakenings minimized  Taken 03/22/2024 2206 by Jule Ser, RN  Sleep/Rest Enhancement: awakenings minimized  Taken 03/22/2024 2009 by Jule Ser, RN  Sleep/Rest Enhancement: awakenings minimized     Problem: Skin Injury Risk Increased  Goal: Skin Health and Integrity  Outcome: Ongoing - Unchanged  Intervention: Optimize Skin Protection  Recent Flowsheet Documentation  Taken 03/23/2024 0600 by Jule Ser, RN  Activity Management: bedrest  Pressure Reduction Techniques:   heels elevated off bed   frequent weight shift encouraged  Head of Bed (HOB) Positioning: HOB at 30-45 degrees  Pressure Reduction Devices: pressure-redistributing mattress utilized  Skin Protection:   adhesive use limited   transparent dressing maintained   tubing/devices free from skin contact  Taken 03/23/2024 0400 by Jule Ser, RN  Activity Management: bedrest  Pressure Reduction Techniques:   heels elevated off bed   frequent weight shift encouraged  Head of Bed (HOB) Positioning: HOB at 30-45 degrees  Pressure Reduction Devices: pressure-redistributing mattress utilized  Skin Protection:   adhesive use limited   transparent dressing maintained   tubing/devices free from skin contact  Taken 03/23/2024 0200 by Jule Ser, RN  Activity Management: bedrest  Pressure Reduction Techniques:   heels elevated off bed   frequent weight shift encouraged  Head of Bed (HOB) Positioning: HOB at 30-45 degrees  Pressure Reduction Devices: pressure-redistributing mattress utilized  Skin Protection:   adhesive use limited   transparent dressing maintained   tubing/devices free from skin contact  Taken 03/23/2024 0000 by Jule Ser, RN  Activity Management: bedrest  Pressure Reduction Techniques:   heels elevated off bed   frequent weight shift encouraged  Head  of Bed (HOB) Positioning: HOB at 30-45 degrees  Pressure Reduction Devices: pressure-redistributing mattress utilized  Skin Protection:   adhesive use limited   transparent dressing maintained   tubing/devices free from skin contact  Taken 03/22/2024 2206 by Jule Ser, RN  Activity Management: bedrest  Pressure Reduction Techniques:   heels elevated off bed   frequent weight shift encouraged  Head of Bed (HOB) Positioning: HOB at 30-45 degrees  Pressure Reduction Devices: pressure-redistributing mattress utilized  Skin Protection:   adhesive use limited   transparent dressing maintained   tubing/devices free from skin contact  Taken 03/22/2024 2009 by Jule Ser, RN  Activity Management: bedrest  Pressure Reduction Techniques:   heels elevated off bed   frequent weight shift encouraged  Head of Bed (HOB) Positioning: HOB at 30-45 degrees  Pressure Reduction Devices: pressure-redistributing mattress utilized  Skin Protection:   adhesive use limited   transparent dressing maintained   tubing/devices free from skin contact  Taken 03/22/2024 2000 by Jule Ser, RN  Pressure Reduction Techniques:   heels elevated off bed   frequent weight shift encouraged  Pressure Reduction Devices: pressure-redistributing mattress utilized  Skin Protection:   adhesive use limited   transparent dressing maintained   tubing/devices free from skin contact     Problem: Self-Care Deficit  Goal: Improved Ability to Complete Activities of Daily Living  Outcome: Ongoing - Unchanged     Problem: Artificial Airway  Goal: Effective Communication  Outcome: Ongoing - Unchanged  Goal: Optimal Device Function  Outcome: Ongoing - Unchanged  Intervention: Optimize Device Care and Function  Recent Flowsheet Documentation  Taken 03/23/2024 0600 by Jule Ser, RN  Aspiration Precautions:   NPO pending swallow screening/evaluation   oral hygiene care promoted  Oral Care: mouth swabbed  Taken 03/23/2024 0400 by Jule Ser, RN  Airway/Ventilation Management: airway patency maintained  Aspiration Precautions:   NPO pending swallow screening/evaluation   oral hygiene care promoted  Oral Care: mouth swabbed  Taken 03/23/2024 0200 by Jule Ser, RN  Aspiration Precautions:   NPO pending swallow screening/evaluation   oral hygiene care promoted  Oral Care: mouth swabbed  Taken 03/23/2024 0000 by Jule Ser, RN  Airway/Ventilation Management: airway patency maintained  Aspiration Precautions:   NPO pending swallow screening/evaluation   oral hygiene care promoted  Oral Care: mouth swabbed  Taken 03/22/2024 2206 by Jule Ser, RN  Aspiration Precautions:   NPO pending swallow screening/evaluation   oral hygiene care promoted  Oral Care: mouth swabbed  Taken 03/22/2024 2009 by Jule Ser, RN  Aspiration Precautions:   NPO pending swallow screening/evaluation   oral hygiene care promoted  Oral Care: mouth swabbed  Taken 03/22/2024 2000 by Jule Ser, RN  Airway/Ventilation Management: airway patency maintained  Goal: Absence of Device-Related Skin or Tissue Injury  Outcome: Ongoing - Unchanged  Intervention: Maintain Skin and Tissue Health  Recent Flowsheet Documentation  Taken 03/23/2024 0600 by Jule Ser, RN  Device Skin Pressure Protection:   absorbent pad utilized/changed   adhesive use limited   tubing/devices free from skin contact  Taken 03/23/2024 0400 by Jule Ser, RN  Device Skin Pressure Protection:   absorbent pad utilized/changed   adhesive use limited   tubing/devices free from skin contact  Taken 03/23/2024 0200 by Jule Ser, RN  Device Skin Pressure Protection:   absorbent pad utilized/changed   adhesive use limited   tubing/devices free from skin contact  Taken 03/23/2024 0000 by Jule Ser, RN  Device Skin Pressure Protection:   absorbent  pad utilized/changed   adhesive use limited   tubing/devices free from skin contact  Taken 03/22/2024 2206 by Jule Ser, RN  Device Skin Pressure Protection:   absorbent pad utilized/changed   adhesive use limited   tubing/devices free from skin contact  Taken 03/22/2024 2009 by Jule Ser, RN  Device Skin Pressure Protection:   absorbent pad utilized/changed   adhesive use limited   tubing/devices free from skin contact  Taken 03/22/2024 2000 by Jule Ser, RN  Device Skin Pressure Protection:   absorbent pad utilized/changed   adhesive use limited   tubing/devices free from skin contact     Problem: Malnutrition  Goal: Improved Nutritional Intake  Outcome: Ongoing - Unchanged     Problem: Mechanical Ventilation Invasive  Goal: Effective Communication  Outcome: Ongoing - Unchanged  Goal: Optimal Device Function  Outcome: Ongoing - Unchanged  Intervention: Optimize Device Care and Function  Recent Flowsheet Documentation  Taken 03/23/2024 0600 by Jule Ser, RN  Oral Care: mouth swabbed  Taken 03/23/2024 0400 by Jule Ser, RN  Airway/Ventilation Management: airway patency maintained  Oral Care: mouth swabbed  Taken 03/23/2024 0200 by Jule Ser, RN  Oral Care: mouth swabbed  Taken 03/23/2024 0000 by Jule Ser, RN  Airway/Ventilation Management: airway patency maintained  Oral Care: mouth swabbed  Taken 03/22/2024 2206 by Jule Ser, RN  Oral Care: mouth swabbed  Taken 03/22/2024 2009 by Jule Ser, RN  Oral Care: mouth swabbed  Taken 03/22/2024 2000 by Jule Ser, RN  Airway/Ventilation Management: airway patency maintained  Goal: Mechanical Ventilation Liberation  Outcome: Ongoing - Unchanged  Intervention: Promote Extubation and Mechanical Ventilation Liberation  Recent Flowsheet Documentation  Taken 03/23/2024 0600 by Jule Ser, RN  Sleep/Rest Enhancement: awakenings minimized  Taken 03/23/2024 0400 by Jule Ser, RN  Sleep/Rest Enhancement: awakenings minimized  Taken 03/23/2024 0200 by Jule Ser, RN  Sleep/Rest Enhancement: awakenings minimized  Taken 03/23/2024 0000 by Jule Ser, RN  Sleep/Rest Enhancement: awakenings minimized  Taken 03/22/2024 2206 by Jule Ser, RN  Sleep/Rest Enhancement: awakenings minimized  Taken 03/22/2024 2009 by Jule Ser, RN  Sleep/Rest Enhancement: awakenings minimized  Goal: Optimal Nutrition Delivery  Outcome: Ongoing - Unchanged  Goal: Absence of Device-Related Skin and Tissue Injury  Outcome: Ongoing - Unchanged  Intervention: Maintain Skin and Tissue Health  Recent Flowsheet Documentation  Taken 03/23/2024 0600 by Jule Ser, RN  Device Skin Pressure Protection:   absorbent pad utilized/changed   adhesive use limited   tubing/devices free from skin contact  Taken 03/23/2024 0400 by Jule Ser, RN  Device Skin Pressure Protection:   absorbent pad utilized/changed   adhesive use limited   tubing/devices free from skin contact  Taken 03/23/2024 0200 by Jule Ser, RN  Device Skin Pressure Protection:   absorbent pad utilized/changed   adhesive use limited   tubing/devices free from skin contact  Taken 03/23/2024 0000 by Jule Ser, RN  Device Skin Pressure Protection:   absorbent pad utilized/changed   adhesive use limited   tubing/devices free from skin contact  Taken 03/22/2024 2206 by Jule Ser, RN  Device Skin Pressure Protection:   absorbent pad utilized/changed   adhesive use limited   tubing/devices free from skin contact  Taken 03/22/2024 2009 by Jule Ser, RN  Device Skin Pressure Protection:   absorbent pad utilized/changed   adhesive use limited   tubing/devices free from skin contact  Taken 03/22/2024 2000 by Jule Ser, RN  Device Skin Pressure Protection:   absorbent pad utilized/changed   adhesive use limited  tubing/devices free from skin contact  Goal: Absence of Ventilator-Induced Lung Injury  Outcome: Ongoing - Unchanged  Intervention: Prevent Ventilator-Associated Pneumonia  Recent Flowsheet Documentation  Taken 03/23/2024 0600 by Jule Ser, RN  Head of Bed [facility]) Positioning: HOB at 30-45 degrees  Oral Care: mouth swabbed  Taken 03/23/2024 0400 by Jule Ser, RN  Head of Bed [facility]) Positioning: HOB at 30-45 degrees  Oral Care: mouth swabbed  Taken 03/23/2024 0200 by Jule Ser, RN  Head of Bed [facility]) Positioning: HOB at 30-45 degrees  Oral Care: mouth swabbed  Taken 03/23/2024 0000 by Jule Ser, RN  Head of Bed [facility]) Positioning: HOB at 30-45 degrees  Oral Care: mouth swabbed  Taken 03/22/2024 2206 by Jule Ser, RN  Head of Bed [facility]) Positioning: HOB at 30-45 degrees  Oral Care: mouth swabbed  Taken 03/22/2024 2009 by Jule Ser, RN  Head of Bed [facility]) Positioning: HOB at 30-45 degrees  Oral Care: mouth swabbed

## 2024-03-23 NOTE — Unmapped (Signed)
 [facility]U PROGRESS NOTE     Date of Service: 03/23/2024    Hospital Day: LOS: 25 days        Surgery Date: TBD   Surgical Attending: Joanie Coddington, MD    Critical Care Attending: Lia Hopping, MD    Interval History:   Weaning down HFNC to 35%, 60L. TF resumed via CorPak. FWF increased to 75q1h.     History of Present Illness:   Tammy Braun is a 83 y.o. female with PMH bladder cancer with cystectomy and ileal conduit, COPD, recent dx of afib, moderate-severe TR, CAD (high calcification score), T2DM presented to [facility] with projectile vomiting and abdominal pain, found to have SBO with transition point at level of ileostomy on CT A/P. On initial presentation, labs remarkable for leukocytosis thought to be iso hemoconcentration, AKI on CKD, mildly elevated venous lactate improved with resuscitation. Admitted to [facility] for worsening hypoxia on HFNC requiring intubation in setting of hiatal hernia. OR on 02/28/24 for ex-lap, reduciton of parastomal hernia, small bowel resection with primary anastomosis and lysis of adhesions.    Hospital Course:  - 02/26/24: Admitted to [facility], floor status, consult to urology for possible ileal conduit revision.   - 02/27/24: Patient level of care escalated to step-down iso increased oxygen needs while in ED.  - 02/28/24: Upgraded to ICU. Intubated at bed-side. OR for ex-lap, reduction of parastomal hernia, small bowel resection with primary anastomosis, lysis of adhesions, abdomen left open with ABThera in place.   - 03/01/24: OR for attempt at abdominal closure. Left open due to desaturations during closure attempt. ABThera in place.  - 03/03/24: RTOR for abdominal closure  -03/06/24: Vas cath placed, CRRT started   -03/09/24: Extubated to HFNC  -03/10/24: CRRT discontinued   - 03/11/24: Re-intubated for hypoxic respiratory failure 2/2 mucus plug   - 03/14/24 Extubated to HFNC   -03/15/24: Vas cath removed   -03/16/24: Placed on BiPap for complete collapse of L lung 2/2 probable mucus plugging  -03/19/24: Re-intubated for mucus plugging. Therapeutic bronch, BAL sent.      ASSESSMENT & PLAN:     Neurologic:  - Pain: tylenol SCH    #schizoaffective disorder  - Monthly prolixin injection (given 3/14)    Cardiovascular:  - MAP goal > 65  - NE gtt, wean as able    #HFpEF  #Pulm HTN  - ECHO 3/12- LVEF 70%, moderately dilated RV with moderate systolic dysfunction, severe pulmonary hypertension.    #Hx of HTN  -holding home amlodipine iso hypotension    #Hx of HLD  - atorvastatin    #Afib/flutter w RVR:   - Prior hx of paroxysmal afib. CHADSvasc = 6, no AC PTA per chart review.      > Holding home diltiazem, question efficacy given likely poor absorption  - Amio via NG tube 200mg  daily   - heparin gtt for [facility]     #Pulmonary embolism  - 3/5 CTA chest: Filling defect in L anterior segmental artery  - heparin gtt     #Acute thrombus right basilic   - Identified 3/3 on BUE duplex  - Supportive care, superficial     Respiratory:  #Acute on chronic hypoxemia  #Hx of COPD  - Aggressive pulmonary toilet  - Continue Brovana & atrovent nebs  - Continue hypertonic saline nebs for clearance     #Aspiration  #Poor pulmonary clearance   - Re-intubated 3/21 for mucous plugging of L lung and inability to clear secretions  -  Extubated 3/23, now on HFNC     #Pseudomonas Pneumonia, resolved  #VAP  - Completed antibiotics course    FEN/GI:  F: ML  E: Replete electrolytes PRN  N: NPO, TF via Corpak  - GI prophylaxis: protonix  - Bowel regimen: miralax and senna BID   - Last BM: 3/23    #Hypernatremia  - Increase FWF to 75mL q1h     #S/p ex-lap, reduction of parastomal hernia, small bowel resection with primary anastomosis, lysis of adhesions  - CT 3/13 demonstrating partial small bowel obstruction  - Blake drain #2 removed 3/16, Blake drain #1 removed 3/17  - WTD dressing changes daily per nursing to midline incision    > midline incision drainage culture with 1+ candida     #Large hiatal hernia, chronic  - re-eval on CT chest/abd/pelvis 3/13 stable    Renal/Genitourinary:  #AKI on CKD  - Nephrology signed off, HD cath removed  - Lasix 40mg  x1, NN goal - 1L    > reassess in PM     #H/o bladder cancer s/p radical cystectomy with ileal conduit (2012):  #Parastomal hernia:  - Strict I&Os  - Ok for no foley in stoma at this time per urology    Endocrine:   #hyperglycemia   - Glycemic Control: SSI, continue NPH 15 unit BID    Hematologic:   - Hgb stable  - Monitor CBC daily, transfuse for hgb 7mg /dL  - DVT ppx: heparin gtt for PE    Immunologic/Infectious Disease:  - Afebrile with resolving leukocytosis     - Cultures:   - Blood cultures (3/4): negative, final   - BAL (3/4): Pseudomonas aeruginosa - susceptible to meropenum    - BAL (3/8): Pseudomonas aeruginosa and candida glabrata  -3/12: Susceptibilities show resistance to Meropenum, Cefepime, Ceftaz and Zosyn.   - Bcx (3/11): Negative, final  - BAL (3/12): 600,000+ psuedomonas (susceptibilities: resistant to meropenum)  - Ucx (3/13): no growth  - BAL (3/20): <10,000 CFU    - Antimicrobials:    - Ceftriaxone 2/27- 2/28 - colonized UTI   - Zosyn (3/1 - 3/5) for abdominal contamination  - Zosyn (3/6) for pseudomonas when sensitivities remained pending  - Zerbaxa (3/12-3/21) for 10 day course    Musculoskeletal:   #Deconditioned   - PT/OT consulted     Daily Care Checklist:   - Stress Ulcer Prevention: No  - DVT Prophylaxis: Chemical: Heparin drip  - Daily Awakening: N/A  - Spontaneous Breathing Trial: N/A  - Indication for Central/PICC Line: Yes  Infusions requiring central access, Hemodynamic monitoring, and Inadequate peripheral access  - Indication for Urinary Catheter: No   - Diagnostic images/reports of past 24hrs reviewed: Yes    Disposition:   - Continue ICU care  - PT/OT consulted:yes    Oliver Hum, MD   Surgical Intensive Care Unit  [place] [facility] [place] [facility] [place]     SUBJECTIVE:      No complaints today. On HFNC.      OBJECTIVE:     Physical Exam:  Constitutional: Laying in bed, chronically ill appearing  Neurologic: AAOX2 self/place, extremities to command, non-focal  Respiratory: nWOB on HFNC, satting upper 90's  Cardiovascular:  Irregularly irregular rhythm   Gastrointestinal: Soft abdomen, midline incision staples in tact, WTD packing in place. Urostomy draining clear yellow urine.   Musculoskeletal: Trace BLE/BUE edema.  Skin: Warm and dry     Temp:  [36.6 ??C (97.9 ??F)-37.2 ??C (98.9 ??F)]  37.2 ??C (98.9 ??F)  Heart Rate:  [102-126] 113  SpO2 Pulse:  [92-128] 110  Resp:  [9-30] 18  BP: (84-137)/(36-79) 105/67  MAP (mmHg):  [52-89] 79  FiO2 (%):  [30 %-40 %] 30 %  SpO2:  [91 %-100 %] 96 %     Recent Laboratory Results:  No results for input(s): SPECTYPEART, PHART, PCO2ART, PO2ART, HCO3ART, BEART, O2SATART in the last 24 hours.    Recent Labs     Units 03/21/24  1436 03/22/24  0444 03/22/24  1806   NA mmol/L 148* 149* 151*   K mmol/L 4.0 4.0 4.1   CL mmol/L 110* 109* 110*   CO2 mmol/L 28.0 30.0 32.0*   BUN mg/dL 72* 68* 67*   CREATININE mg/dL 2.02* 5.42* 7.06*   GLU mg/dL 237 628 315     Lab Results   Component Value Date    BILITOT 0.3 03/22/2024    BILITOT 0.3 03/15/2024    BILIDIR 0.10 03/22/2024    BILIDIR 0.20 03/15/2024    ALT <7 (L) 03/22/2024    ALT 10 03/15/2024    AST 9 03/22/2024    AST 13 03/15/2024    GGT 16 09/17/2011    GGT 354 (H) 05/03/2011    ALKPHOS 70 03/22/2024    ALKPHOS 60 03/15/2024    PROT 6.5 03/22/2024    PROT 5.7 03/15/2024    ALBUMIN 2.0 (L) 03/22/2024    ALBUMIN 1.9 (L) 03/15/2024     Recent Labs     Units 03/22/24  0625 03/22/24  1255 03/22/24  1804 03/22/24  2346   POCGLU mg/dL 176 160 737 106     Recent Labs     Units 03/20/24  0426 03/21/24  0356 03/22/24  0444   WBC 10*9/L 11.8* 12.8* 11.5*   RBC 10*12/L 2.74* 2.67* 2.78*   HGB g/dL 7.4* 7.4* 7.6*   HCT % 23.3* 23.0* 24.0*   MCV fL 85.3 85.9 86.1   MCH pg 27.1 27.6 27.5   MCHC g/dL 26.9* 48.5 46.2*   RDW % 18.2* 17.9* 18.2*   PLT 10*9/L 281 244 268   MPV fL 9.9 9.9 9.9     No results for input(s): INR, APTT in the last 24 hours.    Invalid input(s): PTPATIENT    Lines & Tubes:   Patient Lines/Drains/Airways Status       Active Peripheral & Central Intravenous Access       Name Placement date Placement time Site Days    Peripheral IV 03/22/24 Left;Posterior Hand 03/22/24  1556  Hand  less than 1    Peripheral IV 03/22/24 Anterior;Right Forearm 03/22/24  1558  Forearm  less than 1    CVC Triple Lumen 03/07/24 Non-tunneled Left Internal jugular 03/07/24  0849  Internal jugular  15                     Patient Lines/Drains/Airways Status       Active Wounds       Name Placement date Placement time Site Days    Surgical Site 04/08/18 Shoulder Left 04/08/18  1338  -- 2175    Surgical Site 06/05/22 Eye Left 06/05/22  0928  -- 656    Surgical Site 06/19/22 Eye Right 06/19/22  1024  -- 642    Surgical Site 03/03/24 Abdomen 03/03/24  1013  -- 19    Wound 06/24/20 Other (comment) Buttocks inner right buttock 06/24/20  1010  Buttocks  1367  Wound 03/11/24 Other (comment) Sacrum Mid moisture/friction injury 03/11/24  1200  Sacrum  11    Wound 03/13/24 Neck Right open area note arount the HD catheter durng the dressing change 03/13/24  0930  Neck  9                     Respiratory/ventilator settings for last 24 hours:   Vent Mode: PSV-CPAP  FiO2 (%): 30 %  S RR: 15  S VT: 400 mL  PEEP: 5 cm H20  PR SUP: 5 cm H20    Intake/Output last 3 shifts:  I/O last 3 completed shifts:  In: 2315.3 [I.V.:720.3; NG/GT:250; IV Piggyback:100]  Out: 3645 [Urine:3645]    Daily/Recent Weight:  68.8 kg (151 lb 10.8 oz)    BMI:  Body mass index is 28.67 kg/m??.    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    Chronic kidney disease     Depression, psychotic (CMS-HCC)     Diabetes mellitus (CMS-HCC)     in past    Emphysema of lung (CMS-HCC) Financial difficulties     Frail elderly     Hearing impairment     Hernia     History of transfusion     Hyperlipidemia     Hypertension     Impaired mobility     Osteoporosis     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/08/2015    Visual impairment      Past Surgical History:   Procedure Laterality Date    ABDOMINAL SURGERY      BLADDER SURGERY      BREAST CYST EXCISION      CHEMOTHERAPY  2012    bladder    GALLBLADDER SURGERY      stone removal    ILEOSTOMY  2012    PR COLONOSCOPY FLX DX W/COLLJ SPEC WHEN PFRMD N/A 02/09/2015    Procedure: COLONOSCOPY, FLEXIBLE, PROXIMAL TO SPLENIC FLEXURE; DIAGNOSTIC, W/WO COLLECTION SPECIMEN BY BRUSH OR WASH;  Surgeon: Dewaine Conger, MD;  Location: [facility] [place];  Service: Gastroenterology    PR EXPLORATORY OF ABDOMEN N/A 02/28/2024    Procedure: EXPLORATORY LAPAROTOMY, EXPLORATORY CELIOTOMY WITH OR WITHOUT BIOPSY(S);  Surgeon: Renda Rolls, MD;  Location: [facility];  Service: General Surgery    PR RECONSTR TOTAL SHOULDER IMPLANT Left 04/08/2018    Procedure: ARTHROPLASTY, GLENOHUMERAL JOINT; TOTAL SHOULDER(GLENOID & PROXIMAL HUMERAL REPLACEMENT(EG, TOTAL SHOULDER);  Surgeon: Tomasa Rand, MD;  Location: [facility];  Service: Ortho Sports Medicine    PR REOPEN RECENT ABD EXPLORATORY Midline 03/01/2024    Procedure: REOPENING OF RECENT LAPAROTOMY;  Surgeon: [name]., MD;  Location: OR [facility];  Service: Trauma    PR REOPEN RECENT ABD EXPLORATORY N/A 03/03/2024    Procedure: REOPENING OF RECENT LAPAROTOMY;  Surgeon: Joanie Coddington, MD;  Location: OR [facility];  Service: Trauma    PR SIGMOIDOSCOPY,BIOPSY N/A 03/11/2015    Procedure: Arnell Sieving; WITH BIOPSY, SINGLE OR MULTIPLE;  Surgeon: Wilburt Finlay, MD;  [facility];  Service: Gastroenterology    PR XCAPSL CTRC RMVL INSJ IO LENS PROSTH W/O ECP Left 06/05/2022    Procedure: EXTRACAPSULAR CATARACT REMOVAL W/INSERTION OF  INTRAOCULAR LENS PROSTHESIS, MANUAL OR MECHANICAL TECHNIQUE WITHOUT ENDOSCOPIC CYCLOPHOTOCOAGULATION;  Surgeon: Garner Gavel, MD;  Location: [facility] OR [facility];  Service: Ophthalmology    PR XCAPSL CTRC RMVL INSJ IO LENS PROSTH W/O ECP Right 06/19/2022    Procedure: EXTRACAPSULAR CATARACT REMOVAL W/INSERTION OF INTRAOCULAR LENS PROSTHESIS, MANUAL OR MECHANICAL TECHNIQUE WITHOUT ENDOSCOPIC CYCLOPHOTOCOAGULATION;  Surgeon: Garner Gavel, MD;  Location: [facility] OR [facility];  Service: Ophthalmology     Scheduled Medications:   acetaminophen  1,000 mg Enteral tube: gastric Q8H SCH    amiodarone  200 mg Enteral tube: gastric Daily    arformoterol  15 mcg Nebulization BID (RT)    aspirin  81 mg Enteral tube: gastric Daily    atorvastatin  40 mg Enteral tube: gastric Daily    carboxymethylcellulose sodium  2 drop Both Eyes TID    esomeprazole  40 mg Enteral tube: gastric daily    flu vacc [id number](58yr up)-PF  0.5 mL Intramuscular During hospitalization    fluPHENAZine decanoate  50 mg Intramuscular Q30 Days    insulin NPH  15 Units Subcutaneous BID AC    insulin regular  0-20 Units Subcutaneous Q6H SCH    ipratropium  500 mcg Nebulization Q6H (RT)    methocarbamol  500 mg Enteral tube: gastric QID    polyethylene glycol  17 g Enteral tube: gastric BID    sennosides  2.5 mL Enteral tube: gastric BID    sodium chloride  10 mL Intravenous Q8H    sodium chloride  10 mL Intravenous Q8H    sodium chloride  10 mL Intravenous Q8H    sodium chloride  4 mL Nebulization Q6H (RT)     Continuous Infusions:   fentaNYL citrate (PF) 50 mcg/mL infusion Stopped (03/22/24 1405)    heparin 16 Units/kg/hr (03/22/24 1000)    NORepinephrine bitartrate-NS 1 mcg/min (03/22/24 1634)     PRN Medications:  albuterol, calcium gluconate, calcium gluconate, dextrose in water, fentaNYL (PF) **OR** fentaNYL (PF), heparin (porcine), heparin (porcine), magnesium sulfate in water, melatonin, nicotine polacrilex **OR** nicotine polacrilex, ondansetron, phenol, potassium chloride in water

## 2024-03-24 ENCOUNTER — Ambulatory Visit: Admit: 2024-03-24 | Payer: MEDICARE

## 2024-03-24 LAB — BASIC METABOLIC PANEL
ANION GAP: 9 mmol/L (ref 5–14)
ANION GAP: 9 mmol/L (ref 5–14)
ANION GAP: 7 mmol/L (ref 5–14)
BLOOD UREA NITROGEN: 59 mg/dL — ABNORMAL HIGH (ref 9–23)
BLOOD UREA NITROGEN: 62 mg/dL — ABNORMAL HIGH (ref 9–23)
BLOOD UREA NITROGEN: 60 mg/dL — ABNORMAL HIGH (ref 9–23)
BUN / CREAT RATIO: 45
BUN / CREAT RATIO: 47
BUN / CREAT RATIO: 47
CALCIUM: 9 mg/dL (ref 8.7–10.4)
CALCIUM: 8.6 mg/dL — ABNORMAL LOW (ref 8.7–10.4)
CALCIUM: 9.1 mg/dL (ref 8.7–10.4)
CHLORIDE: 113 mmol/L — ABNORMAL HIGH (ref 98–107)
CHLORIDE: 111 mmol/L — ABNORMAL HIGH (ref 98–107)
CHLORIDE: 112 mmol/L — ABNORMAL HIGH (ref 98–107)
CO2: 27 mmol/L (ref 20.0–31.0)
CO2: 27 mmol/L (ref 20.0–31.0)
CO2: 27 mmol/L (ref 20.0–31.0)
CREATININE: 1.32 mg/dL — ABNORMAL HIGH (ref 0.55–1.02)
CREATININE: 1.32 mg/dL — ABNORMAL HIGH (ref 0.55–1.02)
CREATININE: 1.27 mg/dL — ABNORMAL HIGH (ref 0.55–1.02)
EGFR CKD-EPI (2021) FEMALE: 41 mL/min/{1.73_m2} — ABNORMAL LOW (ref >=60)
EGFR CKD-EPI (2021) FEMALE: 41 mL/min/{1.73_m2} — ABNORMAL LOW (ref >=60)
EGFR CKD-EPI (2021) FEMALE: 43 mL/min/{1.73_m2} — ABNORMAL LOW (ref >=60)
GLUCOSE RANDOM: 118 mg/dL (ref 70–179)
GLUCOSE RANDOM: 159 mg/dL (ref 70–179)
GLUCOSE RANDOM: 193 mg/dL — ABNORMAL HIGH (ref 70–179)
POTASSIUM: 3.6 mmol/L (ref 3.4–4.8)
POTASSIUM: 3.8 mmol/L (ref 3.4–4.8)
POTASSIUM: 3.7 mmol/L (ref 3.4–4.8)
SODIUM: 149 mmol/L — ABNORMAL HIGH (ref 135–145)
SODIUM: 147 mmol/L — ABNORMAL HIGH (ref 135–145)
SODIUM: 146 mmol/L — ABNORMAL HIGH (ref 135–145)

## 2024-03-24 LAB — CBC
HEMATOCRIT: 23 % — ABNORMAL LOW (ref 34.0–44.0)
HEMOGLOBIN: 7.4 g/dL — ABNORMAL LOW (ref 11.3–14.9)
MEAN CORPUSCULAR HEMOGLOBIN CONC: 32 g/dL (ref 32.0–36.0)
MEAN CORPUSCULAR HEMOGLOBIN: 28.2 pg (ref 25.9–32.4)
MEAN CORPUSCULAR VOLUME: 88.2 fL (ref 77.6–95.7)
MEAN PLATELET VOLUME: 9.9 fL (ref 6.8–10.7)
PLATELET COUNT: 253 10*9/L (ref 150–450)
RED BLOOD CELL COUNT: 2.61 10*12/L — ABNORMAL LOW (ref 3.95–5.13)
RED CELL DISTRIBUTION WIDTH: 19 % — ABNORMAL HIGH (ref 12.2–15.2)
WBC ADJUSTED: 7.7 10*9/L (ref 3.6–11.2)

## 2024-03-24 LAB — APTT
APTT: 72.9 s — ABNORMAL HIGH (ref 24.8–38.4)
HEPARIN CORRELATION: 0.4

## 2024-03-24 LAB — MAGNESIUM
MAGNESIUM: 1.9 mg/dL (ref 1.6–2.6)
MAGNESIUM: 2.3 mg/dL (ref 1.6–2.6)
MAGNESIUM: 2 mg/dL (ref 1.6–2.6)

## 2024-03-24 LAB — PHOSPHORUS
PHOSPHORUS: 3.1 mg/dL (ref 2.4–5.1)
PHOSPHORUS: 2.9 mg/dL (ref 2.4–5.1)
PHOSPHORUS: 2.6 mg/dL (ref 2.4–5.1)

## 2024-03-24 MED ADMIN — sodium chloride (NS) 0.9 % flush 10 mL: 10 mL | INTRAVENOUS | @ 09:00:00

## 2024-03-24 MED ADMIN — potassium chloride (KLOR-CON) packet 40 mEq: 40 meq | GASTROENTERAL | @ 06:00:00 | Stop: 2024-03-24

## 2024-03-24 MED ADMIN — atorvastatin (LIPITOR) tablet 40 mg: 40 mg | GASTROENTERAL | @ 13:00:00

## 2024-03-24 MED ADMIN — ipratropium (ATROVENT) 0.02 % nebulizer solution 500 mcg: 500 ug | RESPIRATORY_TRACT | @ 02:00:00

## 2024-03-24 MED ADMIN — sodium chloride (NS) 0.9 % flush 10 mL: 10 mL | INTRAVENOUS | @ 03:00:00

## 2024-03-24 MED ADMIN — arformoterol (BROVANA) nebulizer solution 15 mcg/2 mL: 15 ug | RESPIRATORY_TRACT | @ 13:00:00

## 2024-03-24 MED ADMIN — carboxymethylcellulose sodium (THERATEARS) 0.25 % ophthalmic solution 2 drop: 2 [drp] | OPHTHALMIC | @ 02:00:00

## 2024-03-24 MED ADMIN — methocarbamol (ROBAXIN) tablet 500 mg: 500 mg | GASTROENTERAL | @ 02:00:00

## 2024-03-24 MED ADMIN — insulin regular (HumuLIN,NovoLIN) injection 0-20 Units: 0-20 [IU] | SUBCUTANEOUS | @ 17:00:00

## 2024-03-24 MED ADMIN — acetaminophen (TYLENOL) tablet 1,000 mg: 1000 mg | GASTROENTERAL | @ 09:00:00 | Stop: 2024-03-24

## 2024-03-24 MED ADMIN — ipratropium (ATROVENT) 0.02 % nebulizer solution 500 mcg: 500 ug | RESPIRATORY_TRACT | @ 20:00:00

## 2024-03-24 MED ADMIN — albumin human 5 % 12.5 g: 12.5 g | INTRAVENOUS | @ 12:00:00 | Stop: 2024-03-24

## 2024-03-24 MED ADMIN — insulin regular (HumuLIN,NovoLIN) injection 0-20 Units: 0-20 [IU] | SUBCUTANEOUS | @ 21:00:00

## 2024-03-24 MED ADMIN — arformoterol (BROVANA) nebulizer solution 15 mcg/2 mL: 15 ug | RESPIRATORY_TRACT | @ 02:00:00

## 2024-03-24 MED ADMIN — insulin NPH (HumuLIN,NovoLIN) injection 15 Units: 15 [IU] | SUBCUTANEOUS | @ 21:00:00

## 2024-03-24 MED ADMIN — sodium chloride (NS) 0.9 % flush 10 mL: 10 mL | INTRAVENOUS | @ 18:00:00

## 2024-03-24 MED ADMIN — sodium chloride 3 % NEBULIZER solution 4 mL: 4 mL | RESPIRATORY_TRACT | @ 09:00:00

## 2024-03-24 MED ADMIN — methocarbamol (ROBAXIN) tablet 500 mg: 500 mg | GASTROENTERAL | @ 09:00:00 | Stop: 2024-03-24

## 2024-03-24 MED ADMIN — sodium chloride 3 % NEBULIZER solution 4 mL: 4 mL | RESPIRATORY_TRACT | @ 02:00:00

## 2024-03-24 MED ADMIN — furosemide (LASIX) injection 40 mg: 40 mg | INTRAVENOUS | @ 04:00:00 | Stop: 2024-03-23

## 2024-03-24 MED ADMIN — acetaminophen (TYLENOL) tablet 1,000 mg: 1000 mg | GASTROENTERAL | @ 18:00:00 | Stop: 2024-03-24

## 2024-03-24 MED ADMIN — esomeprazole (NEXIUM) granules 40 mg: 40 mg | GASTROENTERAL | @ 09:00:00 | Stop: 2024-03-24

## 2024-03-24 MED ADMIN — melatonin tablet 3 mg: 3 mg | GASTROENTERAL | @ 04:00:00

## 2024-03-24 MED ADMIN — cholecalciferol (vitamin D3) 10 mcg/mL (400 unit/mL) oral solution: 50 ug | ORAL | @ 13:00:00

## 2024-03-24 MED ADMIN — acetaminophen (TYLENOL) tablet 1,000 mg: 1000 mg | GASTROENTERAL | @ 02:00:00

## 2024-03-24 MED ADMIN — sodium chloride 3 % NEBULIZER solution 4 mL: 4 mL | RESPIRATORY_TRACT | @ 20:00:00

## 2024-03-24 MED ADMIN — ipratropium (ATROVENT) 0.02 % nebulizer solution 500 mcg: 500 ug | RESPIRATORY_TRACT | @ 09:00:00

## 2024-03-24 MED ADMIN — aspirin chewable tablet 81 mg: 81 mg | GASTROENTERAL | @ 13:00:00

## 2024-03-24 MED ADMIN — amiodarone (PACERONE) tablet 200 mg: 200 mg | GASTROENTERAL | @ 13:00:00 | Stop: 2024-03-24

## 2024-03-24 MED ADMIN — heparin 25,000 Units/250 mL (100 units/mL) in 0.45% saline infusion (premade): 0-24 [IU]/kg/h | INTRAVENOUS | @ 15:00:00

## 2024-03-24 MED ADMIN — magnesium sulfate in D5W 1 gram/100 mL infusion 1 g: 1 g | INTRAVENOUS | @ 06:00:00 | Stop: 2024-03-24

## 2024-03-24 MED ADMIN — methocarbamol (ROBAXIN) tablet 500 mg: 500 mg | GASTROENTERAL | @ 18:00:00

## 2024-03-24 NOTE — Unmapped (Signed)
 OCCUPATIONAL THERAPY  Evaluation (03/24/24 1416)    Patient Name:  Tammy Braun       Medical Record Number: [id number]     Date of Birth: 1941-03-22  Sex: Female      Post-Discharge Occupational Therapy Recommendations: 5x weekly, Low intensity          Equipment Recommendation  OT DME Recommendations: Defer to post acute       OT Treatment Diagnosis: Need for assistance with personal care, Generalized muscle weakness, Reduced mobility         Assessment  Problem List: Decreased safety awareness, Impaired judgement, Decreased strength, Decreased range of motion, Decreased activity tolerance, Impaired balance, Decreased endurance, Fall risk, Impaired ADLs, Decreased mobility             Assessment: Tammy Braun is a 83 y.o. female with PMH bladder cancer with cystectomy and ileal conduit, COPD, recent dx of afib, moderate-severe TR, CAD (high calcification score), T2DM presented to [facility] with projectile vomiting and abdominal pain, found to have SBO with transition point at level of ileostomy on CT A/P. On initial presentation, labs remarkable for leukocytosis thought to be iso hemoconcentration, AKI on CKD, mildly elevated venous lactate improved with resuscitation. Admitted to [facility] for worsening hypoxia on HFNC requiring intubation in setting of hiatal hernia. OR on 02/28/24 for ex-lap, reduciton of parastomal hernia, small bowel resection with primary anastomosis and lysis of adhesions.  See medical team notes for complicated hospital course. Pt presents with above stated deficits impacting her independence & safety completing ADLs & functional mobility/transfers.  Pt will benefit from skilled OT to maximize her functional independence, safety and decrease her burden of care.  Review of pt's occupational profile, client history, assessment of occupational performance, clinical decision making and development of POC required high complexity OT evaluation.          Today's Interventions: Pt educated on role of OT, POC, abdominal precautions and implications during ADLs, sitting balance, midline orientation, therapeutic use of self to encourage pt, log roll technique, sequencing ADLs, safety cues    Activity Tolerance During Today's Session  Limited by fatigue    Plan  Planned Frequency of Treatment: Plan of Care Initiated: 03/24/24  1-2x per day Weekly Frequency: 3-4 days per week  Planned Treatment Duration: 04/07/24    Planned Interventions:  Clinical research associate, Education (Patient/Family/Caregiver), Home Exercise Program, Self-Care/Home Training, Therapeutic Exercise, Therapeutic Activity      GOALS:   Patient and Family Goals: to get stronger    Short Term:   SHORT GOAL #1: Pt will complete OOB assessment.   Time Frame : 2 weeks  SHORT GOAL #2: Pt will perform grooming in unsupported sitting with Min A.   Time Frame : 2 weeks  SHORT GOAL #3: Pt will complete UBD with min assist.   Time Frame : 2 weeks  SHORT GOAL #4: Pt will complete LBD with max assist.   Time Frame : 2 weeks    Long Term Goal #1: Pt will score 15/24 on AMPAC  Time Frame: 4 weeks    Prognosis:  Fair  Positive Indicators:  PLOF, family support  Barriers to Discharge: Functional strength deficits, Decreased range of motion, Decreased safety awareness, Endurance deficits, Impaired Balance, Gait instability, Cognitive deficits, Inaccessible home environment, Poor insight into deficits, Impulsivity, Inability to safely perform ADLS    Subjective  Medical Updates Since Last Visit/Relevant PMH Affecting Clinical Decision Making:    Prior Functional Status Per prior OT  eval: Per daughter: Pt completes ADLs at the following assist: eating=set up, grooming=set up, bathing=min assist (seated, sponge bath, assist with buttocks), UBD=min assist (assist to pull over head), LBD=set up mostly, assist if pants tight, toileting=can manage her ostomy mostly, assist if a big one.  Pt uses cane for steps, RW in house and transport w/c for longer distances.    Medical Tests / Procedures: reviewed       Patient / Caregiver reports: Geanie Cooley me down.  Lay me down.      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    Chronic kidney disease     Depression, psychotic (CMS-HCC)     Diabetes mellitus     in past    Emphysema of lung     Financial difficulties     Frail elderly     Hearing impairment     Hernia     History of transfusion     Hyperlipidemia     Hypertension     Impaired mobility     Osteoporosis     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/08/2015    Visual impairment     Social History     Tobacco Use    Smoking status: Every Day     Current packs/day: 1.00     Average packs/day: 1 pack/day for 66.0 years (66.0 ttl pk-yrs)     Types: Cigarettes     Start date: 04/09/1958    Smokeless tobacco: Never    Tobacco comments:     15cpd   Substance Use Topics    Alcohol use: No     Alcohol/week: 0.0 standard drinks of alcohol      Past Surgical History:   Procedure Laterality Date    ABDOMINAL SURGERY      BLADDER SURGERY      BREAST CYST EXCISION      CHEMOTHERAPY  2012    bladder    GALLBLADDER SURGERY      stone removal    ILEOSTOMY  2012    PR COLONOSCOPY FLX DX W/COLLJ SPEC WHEN PFRMD N/A 02/09/2015    Procedure: COLONOSCOPY, FLEXIBLE, PROXIMAL TO SPLENIC FLEXURE; DIAGNOSTIC, W/WO COLLECTION SPECIMEN BY BRUSH OR WASH;  Surgeon: Dewaine Conger, MD;  Location: [facility] [place];  Service: Gastroenterology    PR EXPLORATORY OF ABDOMEN N/A 02/28/2024    Procedure: EXPLORATORY LAPAROTOMY, EXPLORATORY CELIOTOMY WITH OR WITHOUT BIOPSY(S);  Surgeon: Renda Rolls, MD;  Location: [facility];  Service: General Surgery    PR RECONSTR TOTAL SHOULDER IMPLANT Left 04/08/2018    Procedure: ARTHROPLASTY, GLENOHUMERAL JOINT; TOTAL SHOULDER(GLENOID & PROXIMAL HUMERAL REPLACEMENT(EG, TOTAL SHOULDER);  Surgeon: Tomasa Rand, MD;  [facility];  Service: Ortho Sports Medicine    PR REOPEN RECENT ABD EXPLORATORY Midline 03/01/2024    Procedure: REOPENING OF RECENT LAPAROTOMY;  Surgeon: [name]., MD;  Location: OR [facility];  Service: Trauma    PR REOPEN RECENT ABD EXPLORATORY N/A 03/03/2024    Procedure: REOPENING OF RECENT LAPAROTOMY;  Surgeon: Joanie Coddington, MD;  Location: OR [facility];  Service: Trauma    PR SIGMOIDOSCOPY,BIOPSY N/A 03/11/2015    Procedure: SIGMOIDOSCOPY, FLEXIBLE; WITH BIOPSY, SINGLE OR MULTIPLE;  Surgeon: Wilburt Finlay,  MD;  [facility];  Service: Gastroenterology    PR XCAPSL CTRC RMVL INSJ IO LENS PROSTH W/O ECP Left 06/05/2022    Procedure: EXTRACAPSULAR CATARACT REMOVAL W/INSERTION OF INTRAOCULAR LENS PROSTHESIS, MANUAL OR MECHANICAL TECHNIQUE WITHOUT ENDOSCOPIC CYCLOPHOTOCOAGULATION;  Surgeon: Garner Gavel, MD;  Location: [facility] OR [facility];  Service: Ophthalmology    PR XCAPSL CTRC RMVL INSJ IO LENS PROSTH W/O ECP Right 06/19/2022    Procedure: EXTRACAPSULAR CATARACT REMOVAL W/INSERTION OF INTRAOCULAR LENS PROSTHESIS, MANUAL OR MECHANICAL TECHNIQUE WITHOUT ENDOSCOPIC CYCLOPHOTOCOAGULATION;  Surgeon: Garner Gavel, MD;  [facility] OR [facility];  Service: Ophthalmology    Family History   Problem Relation Age of Onset    Breast cancer Daughter 17    Diabetes Mother     Glaucoma Father     Colon cancer Neg Hx     Ovarian cancer Neg Hx     Endometrial cancer Neg Hx     Anesthesia problems Neg Hx     Bleeding Disorder Neg Hx         Lisinopril, Losartan, and Hctz [hydrochlorothiazide]     Objective Findings  Precautions / Restrictions  Falls precautions, Isolation precautions  abdominal    Weight Bearing  Non-applicable    Required Braces or Orthoses  Non-applicable    Communication Preference  Verbal       Pain  denies pain    Equipment / Environment  Foley, NGT, Supplemental oxygen, Telemetry, Vascular access (PIV, TLC, Port-a-cath, PICC) (6L)    Living Situation  Living Environment: House  Lives With: Daughter (73 yo grandson)  Home Living: One level home, Stairs to enter with rails, Tub/shower unit, Shower chair with back, Grab bars in shower, Hand-held shower hose, Handicapped height toilet  Rail placement (outside): Bilateral rails in reach  Number of Stairs to Enter (outside): 4  Caregiver Identified?: Yes  Caregiver Availability: Intermittent  Caregiver Ability: Limited lifting  Equipment available at home: Straight cane, Rolling walker, Lift-recliner (transport chair)     Cognition   Orientation Level:  Disoriented to time   Arousal/Alertness:  Appropriate responses to stimuli   Attention Span:  Attends with cues to redirect   Memory:  Decreased recall of precautions   Following Commands:  Follows one step commands with repetition, Follows one step commands with increased time   Safety Judgment:  Decreased awareness of need for safety, Impulsivity   Awareness of Errors and Problem Solving:  Assistance required to identify errors made, Assistance required to generate solutions, Assistance required to implement solutions   Comments:      Vision / Hearing   Vision: Wears glasses all the time, Glasses present     Hearing: No deficit identified   Hearing: Daughter reports pt needs hearing aids     Hand Function:  Right Hand Function: Right hand function impaired  Right Hand Impairment: grip strength poor (claw hand presentation)  Left Hand Function: Left hand function impaired  Left Hand Impairment: grip strength poor  Hand Dominance: Right    Skin Inspection:  Skin Inspection: Intact where visualized    ROM / Strength:  UE ROM/Strength: Left Impaired/Limited, Right Impaired/Limited  RUE Impairment: Reduced strength, Limited AROM  LUE Impairment: Reduced strength, Limited AROM  UE ROM/ Strength Comment: trace B shoulder movement, B elbow AROM grossly 3/5  LE ROM/Strength: Left Impaired/Limited, Right Impaired/Limited  RLE Impairment: Reduced strength, Limited AROM  LLE Impairment: Reduced strength, Limited AROM    Coordination:  Coordination: Not tested  Sensation:  RUE Sensation: RUE impaired  RUE Sensation Impairment: Numbness  LUE Sensation: LUE impaired  LUE Sensation Impairment: Numbness  RLE Sensation: RLE impaired  RLE Sensation Impairment: Numbness  LLE Sensation: LLE impaired  LLE Sensation Impairment: Numbness    Balance:  Static Sitting-Level of Assistance: Minimum assistance  Dynamic Sitting-Level of Assistance: Minimum assistance  Sitting Balance comments: fluctuating from min<>SBA, posterior lean    Standing Balance comments: NT 2/2 fatigue    Functional Mobility  Transfers:  (Pt declined 2/2 fatigue)  Bed Mobility - Needs Assistance: Max assist (supine<>sit=max assist (assist with trunk & BLE), cues for technique and encouragement)  Ambulation: Pt declined 2/2 fatigue    ADLs  ADLs: Total Assistance (Pt participated as able while seated EOB, limited by decreased UE/LE ROM, impaired balance and decreased activity tolerance.  Ultimately required total assist with all ADLs (except feeding as pt NPO.))         Patient at end of session: All needs in reach, In bed, Lines intact, Notified Nurse     Occupational Therapy Session Duration  OT Individual [mins]: 31       AM-PAC-Daily Activity  Lower Body Dressing assistance needs: Unable to do/total assistance - Total Dependent Assist  Bathing assistance needs: Unable to do/total assistance - Total Dependent Assist  Toileting assistance needs: Unable to do/total assistance - Total Dependent Assist  Upper Body Dressing assistance needs: Unable to do/total assistance - Total Dependent Assist  Personal Grooming assistance needs: Unable to do/total assistance - Total Dependent Assist  Eating Meals assistance needs: Unable to do/total assistance - Total Dependent Assist (NPO)    Daily Activity Score: 6    Score (in points): % of Functional Impairment, Limitation, Restriction  6: 100% impaired, limited, restricted  7-8: At least 80%, but less than 100% impaired, limited restricted  9-13: At least 60%, but less than 80% impaired, limited restricted  14-19: At least 40%, but less than 60% impaired, limited restricted  20-22: At least 20%, but less than 40% impaired, limited restricted  23: At least 1%, but less than 20% impaired, limited restricted  24: 0% impaired, limited restricted      I attest that I have reviewed the above information.  Signed: Virgina Evener, OT  [place]easar Mons 03/24/2024

## 2024-03-24 NOTE — Unmapped (Signed)
 CVC removed per order. Pt progressed to [facility] and tolerating. Tolerating TF, C. Diff test to be ordered. Albumin bolus administered at start of shift for soft pressures. Adequate UOP maintained. Glucose maintained with insulin coverage. Heparin gtt remains therapeutic.        Problem: Adult Inpatient Plan of Care  Goal: Plan of Care Review  Outcome: Progressing  Goal: Patient-Specific Goal (Individualized)  Outcome: Progressing  Goal: Absence of Hospital-Acquired Illness or Injury  Outcome: Progressing  Intervention: Identify and Manage Fall Risk  Recent Flowsheet Documentation  Taken 03/24/2024 1600 by Felton Clinton, RN  Safety Interventions:   bed alarm   environmental modification   fall reduction program maintained   lighting adjusted for tasks/safety   low bed  Taken 03/24/2024 1400 by Felton Clinton, RN  Safety Interventions:   bed alarm   environmental modification   fall reduction program maintained   lighting adjusted for tasks/safety   low bed  Taken 03/24/2024 1200 by Felton Clinton, RN  Safety Interventions:   bed alarm   environmental modification   fall reduction program maintained   lighting adjusted for tasks/safety   low bed  Taken 03/24/2024 1000 by Felton Clinton, RN  Safety Interventions:   bed alarm   environmental modification   fall reduction program maintained   lighting adjusted for tasks/safety   low bed  Taken 03/24/2024 0800 by Felton Clinton, RN  Safety Interventions:   bed alarm   environmental modification   fall reduction program maintained   lighting adjusted for tasks/safety   low bed  Intervention: Prevent Skin Injury  Recent Flowsheet Documentation  Taken 03/24/2024 0800 by Felton Clinton, RN  Positioning for Skin: Left  Device Skin Pressure Protection:   absorbent pad utilized/changed   adhesive use limited   tubing/devices free from skin contact   positioning supports utilized  Skin Protection:   cleansing with dimethicone incontinence wipes   incontinence pads utilized protective footwear used   tubing/devices free from skin contact   zinc oxide barrier cream  Intervention: Prevent and Manage VTE (Venous Thromboembolism) Risk  Recent Flowsheet Documentation  Taken 03/24/2024 1600 by Felton Clinton, RN  Anti-Embolism Device Type: SCD, Knee  Anti-Embolism Device Status: Refused  Taken 03/24/2024 1200 by Felton Clinton, RN  Anti-Embolism Device Type: SCD, Knee  Anti-Embolism Device Status: Refused  Taken 03/24/2024 0800 by Felton Clinton, RN  Anti-Embolism Device Type: SCD, Knee  Anti-Embolism Device Status: Refused  Intervention: Prevent Infection  Recent Flowsheet Documentation  Taken 03/24/2024 1600 by Felton Clinton, RN  Infection Prevention:   cohorting utilized   environmental surveillance performed   equipment surfaces disinfected   hand hygiene promoted   personal protective equipment utilized   rest/sleep promoted   single patient room provided  Taken 03/24/2024 1400 by Felton Clinton, RN  Infection Prevention:   cohorting utilized   environmental surveillance performed   equipment surfaces disinfected   hand hygiene promoted   personal protective equipment utilized   rest/sleep promoted   single patient room provided  Taken 03/24/2024 1200 by Felton Clinton, RN  Infection Prevention:   cohorting utilized   environmental surveillance performed   equipment surfaces disinfected   hand hygiene promoted   personal protective equipment utilized   rest/sleep promoted   single patient room provided  Taken 03/24/2024 1000 by Felton Clinton, RN  Infection Prevention:   cohorting utilized   environmental surveillance performed   equipment surfaces disinfected   hand hygiene promoted   personal protective equipment utilized  rest/sleep promoted   single patient room provided  Taken 03/24/2024 0800 by Felton Clinton, RN  Infection Prevention:   cohorting utilized   environmental surveillance performed   equipment surfaces disinfected   hand hygiene promoted   personal protective equipment utilized   rest/sleep promoted   single patient room provided  Goal: Optimal Comfort and Wellbeing  Outcome: Progressing  Goal: Readiness for Transition of Care  Outcome: Progressing  Goal: Rounds/Family Conference  Outcome: Progressing     Problem: Infection  Goal: Absence of Infection Signs and Symptoms  Outcome: Progressing  Intervention: Prevent or Manage Infection  Recent Flowsheet Documentation  Taken 03/24/2024 1600 by Felton Clinton, RN  Infection Management: aseptic technique maintained  Taken 03/24/2024 1400 by Felton Clinton, RN  Infection Management: aseptic technique maintained  Taken 03/24/2024 1200 by Felton Clinton, RN  Infection Management: aseptic technique maintained  Taken 03/24/2024 1000 by Felton Clinton, RN  Infection Management: aseptic technique maintained  Taken 03/24/2024 0800 by Felton Clinton, RN  Infection Management: aseptic technique maintained     Problem: Fall Injury Risk  Goal: Absence of Fall and Fall-Related Injury  Outcome: Progressing  Intervention: Promote Scientist, clinical (histocompatibility and immunogenetics) Documentation  Taken 03/24/2024 1600 by Felton Clinton, RN  Safety Interventions:   bed alarm   environmental modification   fall reduction program maintained   lighting adjusted for tasks/safety   low bed  Taken 03/24/2024 1400 by Felton Clinton, RN  Safety Interventions:   bed alarm   environmental modification   fall reduction program maintained   lighting adjusted for tasks/safety   low bed  Taken 03/24/2024 1200 by Felton Clinton, RN  Safety Interventions:   bed alarm   environmental modification   fall reduction program maintained   lighting adjusted for tasks/safety   low bed  Taken 03/24/2024 1000 by Felton Clinton, RN  Safety Interventions:   bed alarm   environmental modification   fall reduction program maintained   lighting adjusted for tasks/safety   low bed  Taken 03/24/2024 0800 by Felton Clinton, RN  Safety Interventions:   bed alarm   environmental modification   fall reduction program maintained   lighting adjusted for tasks/safety   low bed     Problem: Wound  Goal: Optimal Coping  Outcome: Progressing  Goal: Optimal Functional Ability  Outcome: Progressing  Goal: Absence of Infection Signs and Symptoms  Outcome: Progressing  Intervention: Prevent or Manage Infection  Recent Flowsheet Documentation  Taken 03/24/2024 1600 by Felton Clinton, RN  Infection Management: aseptic technique maintained  Taken 03/24/2024 1400 by Felton Clinton, RN  Infection Management: aseptic technique maintained  Taken 03/24/2024 1200 by Felton Clinton, RN  Infection Management: aseptic technique maintained  Taken 03/24/2024 1000 by Felton Clinton, RN  Infection Management: aseptic technique maintained  Taken 03/24/2024 0800 by Felton Clinton, RN  Infection Management: aseptic technique maintained  Goal: Improved Oral Intake  Outcome: Progressing  Goal: Optimal Pain Control and Function  Outcome: Progressing  Goal: Skin Health and Integrity  Outcome: Progressing  Intervention: Optimize Skin Protection  Recent Flowsheet Documentation  Taken 03/24/2024 1600 by Felton Clinton, RN  Head of Bed [facility]) Positioning: HOB at 30 degrees  Taken 03/24/2024 1400 by Felton Clinton, RN  Head of Bed [facility]) Positioning: HOB at 30 degrees  Taken 03/24/2024 1200 by Felton Clinton, RN  Head of Bed [facility]) Positioning: HOB at 30 degrees  Taken 03/24/2024 1000 by Felton Clinton, RN  Head of Bed [facility]) Positioning: HOB at 30 degrees  Taken 03/24/2024 0800 by Felton Clinton, RN  Pressure Reduction Techniques:   frequent weight shift encouraged   positioned off wounds  Head of Bed (HOB) Positioning: HOB at 30 degrees  Pressure Reduction Devices: alternating pressure pump (APP)  Skin Protection:   cleansing with dimethicone incontinence wipes   incontinence pads utilized   protective footwear used   tubing/devices free from skin contact   zinc oxide barrier cream  Goal: Optimal Wound Healing  Outcome: Progressing     Problem: Skin Injury Risk Increased  Goal: Skin Health and Integrity  Outcome: Progressing  Intervention: Optimize Skin Protection  Recent Flowsheet Documentation  Taken 03/24/2024 1600 by Felton Clinton, RN  Head of Bed [facility]) Positioning: HOB at 30 degrees  Taken 03/24/2024 1400 by Felton Clinton, RN  Head of Bed [facility]) Positioning: HOB at 30 degrees  Taken 03/24/2024 1200 by Felton Clinton, RN  Head of Bed [facility]) Positioning: HOB at 30 degrees  Taken 03/24/2024 1000 by Felton Clinton, RN  Head of Bed [facility]) Positioning: HOB at 30 degrees  Taken 03/24/2024 0800 by Felton Clinton, RN  Pressure Reduction Techniques:   frequent weight shift encouraged   positioned off wounds  Head of Bed (HOB) Positioning: HOB at 30 degrees  Pressure Reduction Devices: alternating pressure pump (APP)  Skin Protection:   cleansing with dimethicone incontinence wipes   incontinence pads utilized   protective footwear used   tubing/devices free from skin contact   zinc oxide barrier cream     Problem: Self-Care Deficit  Goal: Improved Ability to Complete Activities of Daily Living  Outcome: Progressing     Problem: Artificial Airway  Goal: Effective Communication  Outcome: Progressing  Goal: Optimal Device Function  Outcome: Progressing  Intervention: Optimize Device Care and Function  Recent Flowsheet Documentation  Taken 03/24/2024 1600 by Felton Clinton, RN  Aspiration Precautions:   distractions minimized during oral intake   awake/alert before oral intake  Taken 03/24/2024 1400 by Felton Clinton, RN  Aspiration Precautions:   distractions minimized during oral intake   awake/alert before oral intake  Taken 03/24/2024 1200 by Felton Clinton, RN  Aspiration Precautions:   distractions minimized during oral intake   awake/alert before oral intake  Taken 03/24/2024 1000 by Felton Clinton, RN  Aspiration Precautions:   distractions minimized during oral intake   awake/alert before oral intake  Taken 03/24/2024 0800 by Felton Clinton, RN  Aspiration Precautions:   distractions minimized during oral intake   awake/alert before oral intake  Goal: Absence of Device-Related Skin or Tissue Injury  Outcome: Progressing  Intervention: Maintain Skin and Tissue Health  Recent Flowsheet Documentation  Taken 03/24/2024 0800 by Felton Clinton, RN  Device Skin Pressure Protection:   absorbent pad utilized/changed   adhesive use limited   tubing/devices free from skin contact   positioning supports utilized     Problem: Malnutrition  Goal: Improved Nutritional Intake  Outcome: Progressing     Problem: Mechanical Ventilation Invasive  Goal: Effective Communication  Outcome: Progressing  Goal: Optimal Device Function  Outcome: Progressing  Goal: Mechanical Ventilation Liberation  Outcome: Progressing  Goal: Optimal Nutrition Delivery  Outcome: Progressing  Goal: Absence of Device-Related Skin and Tissue Injury  Outcome: Progressing  Intervention: Maintain Skin and Tissue Health  Recent Flowsheet Documentation  Taken 03/24/2024 0800 by Felton Clinton, RN  Device Skin Pressure Protection:   absorbent pad utilized/changed   adhesive use limited   tubing/devices free from skin contact   positioning supports utilized  Goal: Absence of  Ventilator-Induced Lung Injury  Outcome: Progressing  Intervention: Prevent Ventilator-Associated Pneumonia  Recent Flowsheet Documentation  Taken 03/24/2024 1600 by Felton Clinton, RN  Head of Bed [facility]) Positioning: HOB at 30 degrees  Taken 03/24/2024 1400 by Felton Clinton, RN  Head of Bed [facility]) Positioning: HOB at 30 degrees  Taken 03/24/2024 1200 by Felton Clinton, RN  Head of Bed [facility]) Positioning: HOB at 30 degrees  Taken 03/24/2024 1000 by Felton Clinton, RN  Head of Bed [facility]) Positioning: HOB at 30 degrees  Taken 03/24/2024 0800 by Felton Clinton, RN  Head of Bed [facility]) Positioning: HOB at 30 degrees

## 2024-03-24 NOTE — Unmapped (Signed)
 Problem: Adult Inpatient Plan of Care  Goal: Plan of Care Review  Outcome: Progressing     Problem: Adult Inpatient Plan of Care  Goal: Patient-Specific Goal (Individualized)  Outcome: Progressing     Problem: Adult Inpatient Plan of Care  Goal: Absence of Hospital-Acquired Illness or Injury  Intervention: Prevent and Manage VTE (Venous Thromboembolism) Risk  Recent Flowsheet Documentation  Taken 03/24/2024 0400 by Addysen Louth, Marja Kays, RN  Anti-Embolism Device Type: SCD, Knee  Anti-Embolism Device Status: On  Anti-Embolism Device Location: BLE  Taken 03/24/2024 0200 by Gilles Chiquito, RN  Anti-Embolism Device Type: SCD, Knee  Anti-Embolism Device Status: On  Anti-Embolism Device Location: BLE  Taken 03/24/2024 0000 by Gilles Chiquito, RN  Anti-Embolism Device Type: SCD, Knee  Anti-Embolism Device Status: On  Anti-Embolism Device Location: BLE  Taken 03/23/2024 2200 by Savhanna Sliva, Marja Kays, RN  Anti-Embolism Device Type: SCD, Knee  Anti-Embolism Device Status: On  Anti-Embolism Device Location: BLE  Taken 03/23/2024 2000 by Gilles Chiquito, RN  Anti-Embolism Device Type: SCD, Knee  Anti-Embolism Device Status: On  Anti-Embolism Device Location: BLE     Problem: Adult Inpatient Plan of Care  Goal: Optimal Comfort and Wellbeing  Outcome: Progressing

## 2024-03-24 NOTE — Unmapped (Signed)
 [facility] Home Health Order [id number] printed and placed in your box for signature

## 2024-03-24 NOTE — Unmapped (Signed)
 [facility]U PROGRESS NOTE     Date of Service: 03/24/2024    Hospital Day: LOS: 26 days        Surgery Date: TBD   Surgical Attending: Lawrence Marseilles Day Thurnell Lose, MD    Critical Care Attending: Tad Moore, MD    Interval History:   Soft MAPs overnight but remained off pressors. Received additional lasix dose overnight. Multiple loose bowel movements overnight.     Given 250 ml 5% albumin for soft pressures this morning in the setting of diuresis and large amounts of stool. Good response to fluid bolus.    Transitioned to [facility] from [facility]NC. Speech eval pending but will remain NPO. Tolerating TF at goal.     History of Present Illness:   Tammy Braun is a 83 y.o. female with PMH bladder cancer with cystectomy and ileal conduit, COPD, recent dx of afib, moderate-severe TR, CAD (high calcification score), T2DM presented to [facility] with projectile vomiting and abdominal pain, found to have SBO with transition point at level of ileostomy on CT A/P. On initial presentation, labs remarkable for leukocytosis thought to be iso hemoconcentration, AKI on CKD, mildly elevated venous lactate improved with resuscitation. Admitted to [facility] for worsening hypoxia on HFNC requiring intubation in setting of hiatal hernia. OR on 02/28/24 for ex-lap, reduciton of parastomal hernia, small bowel resection with primary anastomosis and lysis of adhesions.    Hospital Course:  - 02/26/24: Admitted to [facility], floor status, consult to urology for possible ileal conduit revision.   - 02/27/24: Patient level of care escalated to step-down iso increased oxygen needs while in ED.  - 02/28/24: Upgraded to ICU. Intubated at bed-side. OR for ex-lap, reduction of parastomal hernia, small bowel resection with primary anastomosis, lysis of adhesions, abdomen left open with ABThera in place.   - 03/01/24: OR for attempt at abdominal closure. Left open due to desaturations during closure attempt. ABThera in place.  - 03/03/24: RTOR for abdominal closure  -03/06/24: Vas cath placed, CRRT started   -03/09/24: Extubated to HFNC  -03/10/24: CRRT discontinued   - 03/11/24: Re-intubated for hypoxic respiratory failure 2/2 mucus plug   - 03/14/24 Extubated to HFNC   -03/15/24: Vas cath removed   -03/16/24: Placed on BiPap for complete collapse of L lung 2/2 probable mucus plugging  -03/19/24: Re-intubated for mucus plugging. Therapeutic bronch, BAL sent.   -03/22/24: Extubated to HFNC     ASSESSMENT & PLAN:     Neurologic:  - Pain: tylenol SCH    #schizoaffective disorder  - Monthly prolixin injection (given 3/14)    Cardiovascular:  - MAP goal > 60  - Bolus with 12.5g 5% albumin 2/2 soft pressures overnight ISO diuresis and increased stools 3/25    #HFpEF  #Pulm HTN  - ECHO 3/12- LVEF 70%, moderately dilated RV with moderate systolic dysfunction, severe pulmonary hypertension.    #Hx of HTN  -holding home amlodipine iso hypotension    #Hx of HLD  - atorvastatin    #Afib/flutter w RVR:   - Prior hx of paroxysmal afib. CHADSvasc = 6, no AC PTA per chart review.      > Holding home diltiazem, question efficacy given likely poor absorption  - Amio via NG tube 200mg  daily   - heparin gtt for [facility]     #Pulmonary embolism  - 3/5 CTA chest: Filling defect in L anterior segmental artery  - heparin gtt     #Acute thrombus right basilic   - Identified 3/3 on BUE  duplex  - Supportive care, superficial     Respiratory:  #Acute on chronic hypoxemia  #Hx of COPD  - Aggressive pulmonary toilet  - Continue Brovana & atrovent nebs  - Continue hypertonic saline nebs for clearance     #Aspiration  #Poor pulmonary clearance   - Re-intubated 3/21 for mucous plugging of L lung and inability to clear secretions  - Extubated 3/23, now on [facility], wean supplemental oxygen as able     #Pseudomonas Pneumonia, resolved  #VAP  - Completed antibiotics course    FEN/GI:  F: ML  E: Replete electrolytes PRN  N: NPO, TF via [place]  - GI prophylaxis: protonix  - Bowel regimen: HELD ISO diarrhea 3/25   - Last BM: 3/25 (12 stools over the last 36h); check cdiff if stools continue tonight, wean able, low suspicion   - Speech therapy consult 3/25 - recs pending but keep NPO for today    #Hypernatremia  - FWF to 75mL q1h - follow up afternoon labs     #S/p ex-lap, reduction of parastomal hernia, small bowel resection with primary anastomosis, lysis of adhesions  - CT 3/13 demonstrating partial small bowel obstruction  - Blake drain #2 removed 3/16, Blake drain #1 removed 3/17  - WTD dressing changes daily per nursing to midline incision    > midline incision drainage culture with 1+ candida - no treatment necessary    #Large hiatal hernia, chronic  - re-eval on CT chest/abd/pelvis 3/13 stable    Renal/Genitourinary:  #AKI on CKD  - Nephrology signed off, HD cath removed  - Continues to be diuresed with spot dosing, Net - 366cc/24h, does not account for diarrhea  - 250cc 5% albumin bolus 3/25, eval response   - Defer diurese for today      #H/o bladder cancer s/p radical cystectomy with ileal conduit (2012):  #Parastomal hernia:  - Strict I&Os  - Ok for no foley in stoma at this time per urology    Endocrine:   #hyperglycemia   - Glycemic Control: SSI, continue NPH 15 unit BID    Hematologic:   - Hgb stable  - Monitor CBC daily, transfuse for hgb 7mg /dL  - DVT ppx: heparin gtt for PE    Immunologic/Infectious Disease:  - Afebrile with resolved leukocytosis     - Cultures:   - Blood cultures (3/4): negative, final   - BAL (3/4): Pseudomonas aeruginosa - susceptible to meropenum    - BAL (3/8): Pseudomonas aeruginosa and candida glabrata  -3/12: Susceptibilities show resistance to Meropenum, Cefepime, Ceftaz and Zosyn.   - Bcx (3/11): Negative, final  - BAL (3/12): 600,000+ psuedomonas (susceptibilities: resistant to meropenum)  - Ucx (3/13): no growth  - BAL (3/20): <10,000 CFU  - ABD wound cx (3/21): 1+ candida glabrata     - Antimicrobials:    - Ceftriaxone 2/27- 2/28 - colonized UTI   - Zosyn (3/1 - 3/5) for abdominal contamination  - Zosyn (3/6) for pseudomonas when sensitivities remained pending  - Zerbaxa (3/12-3/21) for pseudomonas pna     Musculoskeletal:   #Deconditioned   - PT/OT consulted     Daily Care Checklist:   - Stress Ulcer Prevention: No  - DVT Prophylaxis: Chemical: Heparin drip  - Daily Awakening: N/A  - Spontaneous Breathing Trial: N/A  - Indication for Central/PICC Line: Yes  Infusions requiring central access, Hemodynamic monitoring, and Inadequate peripheral access  - Indication for Urinary Catheter: No   - Diagnostic images/reports of past 24hrs  reviewed: Yes    Disposition:   - Continue ICU care  - PT/OT consulted:yes    Adlyn Fife is critically ill due to acute respiratory failure, hypoxemia, fluid overload, COPD, SBO, prolonged NPO, malnutrition, ileostomy, parastomal hernia, small bowel resection. This critical care time includes examining the patient, evaluating the hemodynamic, laboratory, and radiographic data, independently developing a comprehensive management plan, and serially assessing the patient's response to these critical care interventions. This critical care time excludes procedures.     Critical care time: 60 minutes     Katina Degree, ACNP   Surgical Intensive Care Unit  [place] [facility] [place] [facility]       SUBJECTIVE:      Resting, no complaints     OBJECTIVE:     Physical Exam:  Constitutional: Laying in bed, chronically ill appearing  Neurologic: AAOX2 self/place, extremities to command, non-focal  Respiratory: nWOB on HFNC, satting upper 90's  Cardiovascular:  Irregularly irregular rhythm   Gastrointestinal: Soft abdomen, midline incision staples in tact, WTD packing in place. Urostomy draining clear yellow urine.   Musculoskeletal: Trace BLE/BUE edema.  Skin: Warm and dry     Temp:  [36.4 ??C (97.5 ??F)-36.6 ??C (97.9 ??F)] 36.4 ??C (97.5 ??F)  Heart Rate:  [102-118] 113  SpO2 Pulse:  [100-126] 108  Resp:  [17-24] 21  BP: (67-119)/(37-78) 93/62  MAP (mmHg):  [51-90] 72  FiO2 (%):  [30 %] 30 %  SpO2:  [87 %-100 %] 91 %     Recent Laboratory Results:  No results for input(s): SPECTYPEART, PHART, PCO2ART, PO2ART, HCO3ART, BEART, O2SATART in the last 24 hours.    Recent Labs     Units 03/23/24  1552 03/23/24  2348 03/24/24  0359   NA mmol/L 151* 149* 147*   K mmol/L 3.2* 3.6 3.8   CL mmol/L 114* 113* 111*   CO2 mmol/L 29.0 27.0 27.0   BUN mg/dL 65* 59* 62*   CREATININE mg/dL 1.61* 0.96* 0.45*   GLU mg/dL 74 409 811     Lab Results   Component Value Date    BILITOT 0.3 03/22/2024    BILITOT 0.3 03/15/2024    BILIDIR 0.10 03/22/2024    BILIDIR 0.20 03/15/2024    ALT <7 (L) 03/22/2024    ALT 10 03/15/2024    AST 9 03/22/2024    AST 13 03/15/2024    GGT 16 09/17/2011    GGT 354 (H) 05/03/2011    ALKPHOS 70 03/22/2024    ALKPHOS 60 03/15/2024    PROT 6.5 03/22/2024    PROT 5.7 03/15/2024    ALBUMIN 2.0 (L) 03/22/2024    ALBUMIN 1.9 (L) 03/15/2024     Recent Labs     Units 03/23/24  1852 03/23/24  1913 03/24/24  0003 03/24/24  0505   POCGLU mg/dL 59* 914 782 956     Recent Labs     Units 03/22/24  0444 03/23/24  0528 03/24/24  0359   WBC 10*9/L 11.5* 10.0 7.7   RBC 10*12/L 2.78* 2.66* 2.61*   HGB g/dL 7.6* 7.9* 7.4*   HCT % 24.0* 24.3* 23.0*   MCV fL 86.1 91.3 88.2   MCH pg 27.5 29.5 28.2   MCHC g/dL 21.3* 08.6 57.8   RDW % 18.2* 18.7* 19.0*   PLT 10*9/L 268 254 253   MPV fL 9.9 9.8 9.9     Recent Labs     Units 03/24/24  0359   APTT  sec 72.9*       Lines & Tubes:   Patient Lines/Drains/Airways Status       Active Peripheral & Central Intravenous Access       Name Placement date Placement time Site Days    Peripheral IV 03/22/24 Left;Posterior Hand 03/22/24  1556  Hand  1    Peripheral IV 03/23/24 Posterior;Right Hand 03/23/24  1755  Hand  less than 1    CVC Triple Lumen 03/07/24 Non-tunneled Left Internal jugular 03/07/24  0849  Internal jugular  16                     Patient Lines/Drains/Airways Status       Active Wounds       Name Placement date Placement time Site Days    Surgical Site 04/08/18 Shoulder Left 04/08/18  1338  -- 2176    Surgical Site 06/05/22 Eye Left 06/05/22  0928  -- 657    Surgical Site 06/19/22 Eye Right 06/19/22  1024  -- 643    Surgical Site 03/03/24 Abdomen 03/03/24  1013  -- 20    Wound 06/24/20 Other (comment) Buttocks inner right buttock 06/24/20  1010  Buttocks  1368    Wound 03/13/24 Neck Right open area note arount the HD catheter durng the dressing change 03/13/24  0930  Neck  10                     Respiratory/ventilator settings for last 24 hours:   FiO2 (%): 30 %    Intake/Output last 3 shifts:  I/O last 3 completed shifts:  In: 810.7 [P.O.:100; I.V.:430.7; NG/GT:280]  Out: 2145 [Urine:2145]    Daily/Recent Weight:  65.2 kg (143 lb 11.8 oz)    BMI:  Body mass index is 27.17 kg/m??.    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    Chronic kidney disease     Depression, psychotic (CMS-HCC)     Diabetes mellitus (CMS-HCC)     in past    Emphysema of lung (CMS-HCC)     Financial difficulties     Frail elderly     Hearing impairment     Hernia     History of transfusion     Hyperlipidemia     Hypertension     Impaired mobility     Osteoporosis     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/08/2015    Visual impairment      Past Surgical History:   Procedure Laterality Date    ABDOMINAL SURGERY      BLADDER SURGERY      BREAST CYST EXCISION      CHEMOTHERAPY  2012    bladder    GALLBLADDER SURGERY      stone removal    ILEOSTOMY  2012    PR COLONOSCOPY FLX DX W/COLLJ SPEC WHEN PFRMD N/A 02/09/2015    Procedure: COLONOSCOPY, FLEXIBLE, PROXIMAL TO SPLENIC FLEXURE; DIAGNOSTIC, W/WO COLLECTION SPECIMEN BY BRUSH OR WASH;  Surgeon: Dewaine Conger, MD;  Location: [facility];  Service: Gastroenterology    PR EXPLORATORY OF ABDOMEN N/A 02/28/2024    Procedure: EXPLORATORY LAPAROTOMY, EXPLORATORY CELIOTOMY WITH OR WITHOUT BIOPSY(S);  Surgeon: Renda Rolls,  MD;  [facility];  Service: General Surgery    PR RECONSTR TOTAL SHOULDER IMPLANT Left 04/08/2018    Procedure: ARTHROPLASTY, GLENOHUMERAL JOINT; TOTAL SHOULDER(GLENOID & PROXIMAL HUMERAL REPLACEMENT(EG, TOTAL SHOULDER);  Surgeon: Tomasa Rand, MD;  Location: [facility] OR [facility];  Service: Ortho Sports Medicine    PR REOPEN RECENT ABD EXPLORATORY Midline 03/01/2024    Procedure: REOPENING OF RECENT LAPAROTOMY;  Surgeon: [name]., MD;  Location: OR [facility];  Service: Trauma    PR REOPEN RECENT ABD EXPLORATORY N/A 03/03/2024    Procedure: REOPENING OF RECENT LAPAROTOMY;  Surgeon: Joanie Coddington, MD;  Location: OR [facility];  Service: Trauma    PR SIGMOIDOSCOPY,BIOPSY N/A 03/11/2015    Procedure: SIGMOIDOSCOPY, FLEXIBLE; WITH BIOPSY, SINGLE OR MULTIPLE;  Surgeon: Wilburt Finlay, MD;  [facility]: [facility];  Service: Gastroenterology    PR XCAPSL CTRC RMVL INSJ IO LENS PROSTH W/O ECP Left 06/05/2022    Procedure: EXTRACAPSULAR CATARACT REMOVAL W/INSERTION OF INTRAOCULAR LENS PROSTHESIS, MANUAL OR MECHANICAL TECHNIQUE WITHOUT ENDOSCOPIC CYCLOPHOTOCOAGULATION;  Surgeon: Garner Gavel, MD;  Location: [facility] OR [facility];  Service: Ophthalmology    PR XCAPSL CTRC RMVL INSJ IO LENS PROSTH W/O ECP Right 06/19/2022    Procedure: EXTRACAPSULAR CATARACT REMOVAL W/INSERTION OF INTRAOCULAR LENS PROSTHESIS, MANUAL OR MECHANICAL TECHNIQUE WITHOUT ENDOSCOPIC CYCLOPHOTOCOAGULATION;  Surgeon: Garner Gavel, MD;  Location: [facility] OR [facility];  Service: Ophthalmology     Scheduled Medications:   acetaminophen  1,000 mg Enteral tube: gastric Q8H SCH    amiodarone  200 mg Enteral tube: gastric Daily    arformoterol  15 mcg Nebulization BID (RT)    aspirin  81 mg Enteral tube: gastric Daily    atorvastatin  40 mg Enteral tube: gastric Daily    carboxymethylcellulose sodium  2 drop Both Eyes TID    cholecalciferol 50 mcg Oral Daily    esomeprazole  40 mg Enteral tube: gastric daily    flu vacc [id number](43yr up)-PF  0.5 mL Intramuscular During hospitalization    fluPHENAZine decanoate  50 mg Intramuscular Q30 Days    insulin NPH  15 Units Subcutaneous BID AC    insulin regular  0-20 Units Subcutaneous Q6H SCH    ipratropium  500 mcg Nebulization Q6H (RT)    methocarbamol  500 mg Enteral tube: gastric QID    polyethylene glycol  17 g Enteral tube: gastric BID    [Provider Hold] sennosides  2.5 mL Enteral tube: gastric BID    sodium chloride  10 mL Intravenous Q8H    sodium chloride  10 mL Intravenous Q8H    sodium chloride  10 mL Intravenous Q8H    sodium chloride  4 mL Nebulization Q6H (RT)     Continuous Infusions:   heparin 16 Units/kg/hr (03/24/24 0600)     PRN Medications:  albuterol, calcium gluconate, calcium gluconate, dextrose in water, fentaNYL (PF) **OR** fentaNYL (PF), heparin (porcine), heparin (porcine), magnesium sulfate in water, melatonin, nicotine polacrilex **OR** nicotine polacrilex, ondansetron, phenol, potassium chloride in water

## 2024-03-24 NOTE — Unmapped (Incomplete)
 STCCU PROGRESS NOTE     Date of Service: 03/24/2024    Hospital Day: LOS: 26 days        Surgery Date: TBD   Surgical Attending: Lawrence Marseilles Day Thurnell Lose, MD    Critical Care Attending: Tad Moore, MD    Interval History:   Soft MAPs overnight but remained off pressors. Received additional lasix dose overnight. Multiple loose bowel movements overnight.     Given 250 ml 5% albumin for soft pressures this morning in the setting of diuresis and large amounts of stool. Good response to fluid bolus.    Transitioned to [facility] from HFNC. Speech eval pending but will remain NPO. Tolerating TF at goal.     History of Present Illness:   Tammy Braun is a 83 y.o. female with PMH bladder cancer with cystectomy and ileal conduit, COPD, recent dx of afib, moderate-severe TR, CAD (high calcification score), T2DM presented to [facility] with projectile vomiting and abdominal pain, found to have SBO with transition point at level of ileostomy on CT A/P. On initial presentation, labs remarkable for leukocytosis thought to be iso hemoconcentration, AKI on CKD, mildly elevated venous lactate improved with resuscitation. Admitted to [facility] for worsening hypoxia on HFNC requiring intubation in setting of hiatal hernia. OR on 02/28/24 for ex-lap, reduciton of parastomal hernia, small bowel resection with primary anastomosis and lysis of adhesions.    Hospital Course:  - 02/26/24: Admitted to [facility], floor status, consult to urology for possible ileal conduit revision.   - 02/27/24: Patient level of care escalated to step-down iso increased oxygen needs while in ED.  - 02/28/24: Upgraded to ICU. Intubated at bed-side. OR for ex-lap, reduction of parastomal hernia, small bowel resection with primary anastomosis, lysis of adhesions, abdomen left open with ABThera in place.   - 03/01/24: OR for attempt at abdominal closure. Left open due to desaturations during closure attempt. ABThera in place.  - 03/03/24: RTOR for abdominal closure  -03/06/24: Vas cath placed, CRRT started   -03/09/24: Extubated to HFNC  -03/10/24: CRRT discontinued   - 03/11/24: Re-intubated for hypoxic respiratory failure 2/2 mucus plug   - 03/14/24 Extubated to HFNC   -03/15/24: Vas cath removed   -03/16/24: Placed on BiPap for complete collapse of L lung 2/2 probable mucus plugging  -03/19/24: Re-intubated for mucus plugging. Therapeutic bronch, BAL sent.   -03/22/24: Extubated to HFNC     ASSESSMENT & PLAN:     Neurologic:  - Pain: tylenol SCH    #schizoaffective disorder  - Monthly prolixin injection (given 3/14)    Cardiovascular:  - MAP goal > 60  - Bolus with 12.5g 5% albumin 2/2 soft pressures overnight ISO diuresis and increased stools 3/25    #HFpEF  #Pulm HTN  - ECHO 3/12- LVEF 70%, moderately dilated RV with moderate systolic dysfunction, severe pulmonary hypertension.    #Hx of HTN  -holding home amlodipine iso hypotension    #Hx of HLD  - atorvastatin    #Afib/flutter w RVR:   - Prior hx of paroxysmal afib. CHADSvasc = 6, no AC PTA per chart review.      > Holding home diltiazem, question efficacy given likely poor absorption  - Amio via NG tube 200mg  daily   - heparin gtt for [facility]     #Pulmonary embolism  - 3/5 CTA chest: Filling defect in L anterior segmental artery  - heparin gtt     #Acute thrombus right basilic   - Identified 3/3 on BUE  duplex  - Supportive care, superficial     Respiratory:  #Acute on chronic hypoxemia  #Hx of COPD  - Aggressive pulmonary toilet  - Continue Brovana & atrovent nebs  - Continue hypertonic saline nebs for clearance     #Aspiration  #Poor pulmonary clearance   - Re-intubated 3/21 for mucous plugging of L lung and inability to clear secretions  - Extubated 3/23, now on [facility], wean supplemental oxygen as able     #Pseudomonas Pneumonia, resolved  #VAP  - Completed antibiotics course    FEN/GI:  F: ML  E: Replete electrolytes PRN  N: NPO, TF via [place]  - GI prophylaxis: protonix  - Bowel regimen: HELD ISO diarrhea 3/25   - Last BM: 3/25 (12 stools over the last 36h); check cdiff if stools continue tonight, wean able, low suspicion   - Speech therapy consult 3/25 - recs pending but keep NPO for today    #Hypernatremia  - FWF to 75mL q1h - follow up afternoon labs     #S/p ex-lap, reduction of parastomal hernia, small bowel resection with primary anastomosis, lysis of adhesions  - CT 3/13 demonstrating partial small bowel obstruction  - Blake drain #2 removed 3/16, Blake drain #1 removed 3/17  - WTD dressing changes daily per nursing to midline incision    > midline incision drainage culture with 1+ candida - no treatment necessary    #Large hiatal hernia, chronic  - re-eval on CT chest/abd/pelvis 3/13 stable    Renal/Genitourinary:  #AKI on CKD  - Nephrology signed off, HD cath removed  - Continues to be diuresed with spot dosing, Net - 366cc/24h, does not account for diarrhea  - 250cc 5% albumin bolus 3/25, eval response   - Defer diurese for today      #H/o bladder cancer s/p radical cystectomy with ileal conduit (2012):  #Parastomal hernia:  - Strict I&Os  - Ok for no foley in stoma at this time per urology    Endocrine:   #hyperglycemia   - Glycemic Control: SSI, continue NPH 15 unit BID    Hematologic:   - Hgb stable  - Monitor CBC daily, transfuse for hgb 7mg /dL  - DVT ppx: heparin gtt for PE    Immunologic/Infectious Disease:  - Afebrile with resolved leukocytosis     - Cultures:   - Blood cultures (3/4): negative, final   - BAL (3/4): Pseudomonas aeruginosa - susceptible to meropenum    - BAL (3/8): Pseudomonas aeruginosa and candida glabrata  -3/12: Susceptibilities show resistance to Meropenum, Cefepime, Ceftaz and Zosyn.   - Bcx (3/11): Negative, final  - BAL (3/12): 600,000+ psuedomonas (susceptibilities: resistant to meropenum)  - Ucx (3/13): no growth  - BAL (3/20): <10,000 CFU  - ABD wound cx (3/21): 1+ candida glabrata     - Antimicrobials:    - Ceftriaxone 2/27- 2/28 - colonized UTI   - Zosyn (3/1 - 3/5) for abdominal contamination  - Zosyn (3/6) for pseudomonas when sensitivities remained pending  - Zerbaxa (3/12-3/21) for pseudomonas pna     Musculoskeletal:   #Deconditioned   - PT/OT consulted     Daily Care Checklist:   - Stress Ulcer Prevention: No  - DVT Prophylaxis: Chemical: Heparin drip  - Daily Awakening: N/A  - Spontaneous Breathing Trial: N/A  - Indication for Central/PICC Line: Yes  Infusions requiring central access, Hemodynamic monitoring, and Inadequate peripheral access  - Indication for Urinary Catheter: No   - Diagnostic images/reports of past 24hrs  reviewed: Yes    Disposition:   - Continue ICU care  - PT/OT consulted:yes    Lavelle Berland is critically ill due to acute respiratory failure, hypoxemia, fluid overload, COPD, SBO, prolonged NPO, malnutrition, ileostomy, parastomal hernia, small bowel resection. This critical care time includes examining the patient, evaluating the hemodynamic, laboratory, and radiographic data, independently developing a comprehensive management plan, and serially assessing the patient's response to these critical care interventions. This critical care time excludes procedures.     Critical care time: 60 minutes     Audry Pili, PA   Surgical Intensive Care Unit  [place] [facility] [place] [facility]       SUBJECTIVE:      Resting, no complaints     OBJECTIVE:     Physical Exam:  Constitutional: Laying in bed, chronically ill appearing  Neurologic: AAOX2 self/place, extremities to command, non-focal  Respiratory: nWOB on HFNC, satting upper 90's  Cardiovascular:  Irregularly irregular rhythm   Gastrointestinal: Soft abdomen, midline incision staples in tact, WTD packing in place. Urostomy draining clear yellow urine.   Musculoskeletal: Trace BLE/BUE edema.  Skin: Warm and dry     Temp:  [36.3 ??C (97.4 ??F)-36.8 ??C (98.2 ??F)] 36.3 ??C (97.4 ??F)  Heart Rate:  [98-117] 111  SpO2 Pulse:  [94-120] 100  Resp:  [16-26] 23  BP: (71-120)/(45-79) 92/56  MAP (mmHg):  [53-91] 67  FiO2 (%):  [30 %] 30 %  SpO2: [87 %-100 %] 100 %     Recent Laboratory Results:  No results for input(s): SPECTYPEART, PHART, PCO2ART, PO2ART, HCO3ART, BEART, O2SATART in the last 24 hours.    Recent Labs     Units 03/23/24  2348 03/24/24  0359 03/24/24  1542   NA mmol/L 149* 147* 146*   K mmol/L 3.6 3.8 3.7   CL mmol/L 113* 111* 112*   CO2 mmol/L 27.0 27.0 27.0   BUN mg/dL 59* 62* 60*   CREATININE mg/dL 1.61* 0.96* 0.45*   GLU mg/dL 409 811 914*     Lab Results   Component Value Date    BILITOT 0.3 03/22/2024    BILITOT 0.3 03/15/2024    BILIDIR 0.10 03/22/2024    BILIDIR 0.20 03/15/2024    ALT <7 (L) 03/22/2024    ALT 10 03/15/2024    AST 9 03/22/2024    AST 13 03/15/2024    GGT 16 09/17/2011    GGT 354 (H) 05/03/2011    ALKPHOS 70 03/22/2024    ALKPHOS 60 03/15/2024    PROT 6.5 03/22/2024    PROT 5.7 03/15/2024    ALBUMIN 2.0 (L) 03/22/2024    ALBUMIN 1.9 (L) 03/15/2024     Recent Labs     Units 03/24/24  0003 03/24/24  0505 03/24/24  1246 03/24/24  1647   POCGLU mg/dL 782 956 213* 086     Recent Labs     Units 03/22/24  0444 03/23/24  0528 03/24/24  0359   WBC 10*9/L 11.5* 10.0 7.7   RBC 10*12/L 2.78* 2.66* 2.61*   HGB g/dL 7.6* 7.9* 7.4*   HCT % 24.0* 24.3* 23.0*   MCV fL 86.1 91.3 88.2   MCH pg 27.5 29.5 28.2   MCHC g/dL 57.8* 46.9 62.9   RDW % 18.2* 18.7* 19.0*   PLT 10*9/L 268 254 253   MPV fL 9.9 9.8 9.9     Recent Labs     Units 03/24/24  0359   APTT  sec 72.9*       Lines & Tubes:   Patient Lines/Drains/Airways Status       Active Peripheral & Central Intravenous Access       Name Placement date Placement time Site Days    Peripheral IV 03/22/24 Left;Posterior Hand 03/22/24  1556  Hand  2    Peripheral IV 03/23/24 Posterior;Right Hand 03/23/24  1755  Hand  1                     Patient Lines/Drains/Airways Status       Active Wounds       Name Placement date Placement time Site Days    Surgical Site 04/08/18 Shoulder Left 04/08/18  1338  -- 2177    Surgical Site 06/05/22 Eye Left 06/05/22  0928  -- 658    Surgical Site 06/19/22 Eye Right 06/19/22  1024  -- 644    Surgical Site 03/03/24 Abdomen 03/03/24  1013  -- 21    Wound 06/24/20 Other (comment) Buttocks inner right buttock 06/24/20  1010  Buttocks  1369    Wound 03/13/24 Neck Right open area note arount the HD catheter durng the dressing change 03/13/24  0930  Neck  11                     Respiratory/ventilator settings for last 24 hours:   FiO2 (%): 30 %    Intake/Output last 3 shifts:  I/O last 3 completed shifts:  In: 3409.3 [I.V.:669.3; NG/GT:2740]  Out: 2405 [Urine:2405]    Daily/Recent Weight:  65.2 kg (143 lb 11.8 oz)    BMI:  Body mass index is 27.17 kg/m??.    Medical History:  Past Medical History:   Diagnosis Date    Anxiety     Arthritis     At risk for falls     Breast cyst     Cancer     bladder    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    Chronic kidney disease     Depression, psychotic     Diabetes mellitus     in past    Emphysema of lung     Financial difficulties     Frail elderly     Hearing impairment     Hernia     History of transfusion     Hyperlipidemia     Hypertension     Impaired mobility     Osteoporosis     Pulmonary emphysema 05/08/2015    Visual impairment      Past Surgical History:   Procedure Laterality Date    ABDOMINAL SURGERY      BLADDER SURGERY      BREAST CYST EXCISION      CHEMOTHERAPY  2012    bladder    GALLBLADDER SURGERY      stone removal    ILEOSTOMY  2012    PR COLONOSCOPY FLX DX W/COLLJ SPEC WHEN PFRMD N/A 02/09/2015    Procedure: COLONOSCOPY, FLEXIBLE, PROXIMAL TO SPLENIC FLEXURE; DIAGNOSTIC, W/WO COLLECTION SPECIMEN BY BRUSH OR WASH;  Surgeon: Dewaine Conger, MD;  Location: [facility] [place];  Service: Gastroenterology    PR EXPLORATORY OF ABDOMEN N/A 02/28/2024    Procedure: EXPLORATORY LAPAROTOMY, EXPLORATORY CELIOTOMY WITH OR WITHOUT BIOPSY(S);  Surgeon: Renda Rolls, MD;  Location: [facility];  Service: General Surgery    PR RECONSTR TOTAL SHOULDER IMPLANT Left 04/08/2018 Procedure: ARTHROPLASTY, GLENOHUMERAL JOINT; TOTAL SHOULDER(GLENOID &  PROXIMAL HUMERAL REPLACEMENT(EG, TOTAL SHOULDER);  Surgeon: Tomasa Rand, MD;  [facility];  Service: Ortho Sports Medicine    PR REOPEN RECENT ABD EXPLORATORY Midline 03/01/2024    Procedure: REOPENING OF RECENT LAPAROTOMY;  Surgeon: [name]., MD;  Location: OR [facility];  Service: Trauma    PR REOPEN RECENT ABD EXPLORATORY N/A 03/03/2024    Procedure: REOPENING OF RECENT LAPAROTOMY;  Surgeon: Joanie Coddington, MD;  Location: OR [facility];  Service: Trauma    PR SIGMOIDOSCOPY,BIOPSY N/A 03/11/2015    Procedure: SIGMOIDOSCOPY, FLEXIBLE; WITH BIOPSY, SINGLE OR MULTIPLE;  Surgeon: Wilburt Finlay, MD;  [facility];  Service: Gastroenterology    PR XCAPSL CTRC RMVL INSJ IO LENS PROSTH W/O ECP Left 06/05/2022    Procedure: EXTRACAPSULAR CATARACT REMOVAL W/INSERTION OF INTRAOCULAR LENS PROSTHESIS, MANUAL OR MECHANICAL TECHNIQUE WITHOUT ENDOSCOPIC CYCLOPHOTOCOAGULATION;  Surgeon: Garner Gavel, MD;  Location: [facility] OR [facility];  Service: Ophthalmology    PR XCAPSL CTRC RMVL INSJ IO LENS PROSTH W/O ECP Right 06/19/2022    Procedure: EXTRACAPSULAR CATARACT REMOVAL W/INSERTION OF INTRAOCULAR LENS PROSTHESIS, MANUAL OR MECHANICAL TECHNIQUE WITHOUT ENDOSCOPIC CYCLOPHOTOCOAGULATION;  Surgeon: Garner Gavel, MD;  [facility]: [facility] OR [facility];  Service: Ophthalmology     Scheduled Medications:   acetaminophen  1,000 mg Enteral tube: gastric Q8H SCH    amiodarone  200 mg Enteral tube: gastric Daily    arformoterol  15 mcg Nebulization BID (RT)    aspirin  81 mg Enteral tube: gastric Daily    atorvastatin  40 mg Enteral tube: gastric Daily    carboxymethylcellulose sodium  2 drop Both Eyes TID    cholecalciferol  50 mcg Oral Daily    flu vacc [id number](90yr up)-PF  0.5 mL Intramuscular During hospitalization    fluPHENAZine decanoate  50 mg Intramuscular Q30 Days    insulin NPH  15 Units Subcutaneous BID AC    insulin regular  0-20 Units Subcutaneous Q6H SCH    ipratropium  500 mcg Nebulization Q6H (RT)    methocarbamol  500 mg Enteral tube: gastric Q8H SCH    sodium chloride  10 mL Intravenous Q8H    sodium chloride  10 mL Intravenous Q8H    sodium chloride  10 mL Intravenous Q8H    sodium chloride  4 mL Nebulization Q6H (RT)     Continuous Infusions:   heparin 16 Units/kg/hr (03/24/24 1800)     PRN Medications:  albuterol, calcium gluconate, calcium gluconate, dextrose in water, heparin (porcine), heparin (porcine), HYDROmorphone, magnesium sulfate in water, melatonin, ondansetron, potassium chloride in water

## 2024-03-24 NOTE — Unmapped (Signed)
 Speech Language Pathology Clinical Swallow Assessment  Evaluation (03/24/24 1550)    Patient Name:  Tammy Braun       Medical Record Number: [id number]   Date of Birth: 1941/09/15  Sex: Female            SLP Treatment Diagnosis: Dysphagia- Oropharyngeal     Activity Tolerance: Patient tolerated treatment well    Assessment  Patient seen for a repeat clinical swallow evaluation s/p extubation (patient intubated x3 throughout admission, totaling 17 days, recent extubation 3/23). Known to ST services from earlier in this admission. Patient encountered asleep, roused with verbal stimulation. Alert, well-oriented, following commands, with fluent/appropriate language. Vocal quality was hoarse with strong intensity, largely improved compared to previous encounters. Speech intelligibility slightly reduced, likely due to edentulism. Tolerating 6L [place], recently weaned from HFNC. An oral mechanism exam was completed, structures noted to be intact. Patient edentulous, endorses wearing dentures at baseline. Observed patient with trials of ice chips, thin liquid via spoon, straw, and cup, and moderately thick liquids via spoon and straw. Oral phase was largely intact. Patient with cough/throat clear following trials of thin liquid via straw and cup, and increasingly wet vocal quality following trials of moderately thick liquids. Also noted belching following trials.     Given clinical presentation, safest recommendation is for patient to remain NPO with consideration for ice chips in moderation, with supervision, and after oral care. Recommendations explained to patient to which she indicated understanding. SLP to continue to follow for ongoing PO trials/objective study as indicated. Thank you.  Risk for Aspiration: Severe     Recommendations:  NPO, Consider ice chips PRN, Frequent Oral Care      Follow-up Referral Recommendations : Skilled SLP services to treat dysphagia and address aspiration risk factors    Diet Liquids Recommendations: No Liquids    Diet Solids Recommendation: No Solids    Recommended Form of Medications: Feeding tube      Post Acute Discharge Recommendations  Post Acute SLP Discharge Recommendations: Skilled SLP services indicated, 5x weekly    Prognosis: Fair  Positive Indicators: clinical presentation today, motivation, participation  Barriers to Discharge: Functional strength deficits, Decreased range of motion, Decreased safety awareness, Endurance deficits, Impaired Balance, Gait instability, Cognitive deficits, Inaccessible home environment, Poor insight into deficits, Impulsivity, Inability to safely perform ADLS     Plan of Care  SLP Daily Frequency: 1x per day, 2-3 days per week  Planned Treatment Duration : 04/10/24    Treatment Goals:  Short Term Goal 1: Patient to participate in ongoing clinical swallow evaluation to determine PO recommendations   Time Frame : 2 weeks     LTG 1: Patient to safely discharge to least restricive environment  Time Frame: 2 weeks    Planned Interventions: Dysphagia Intervention     Patient and Family Goal: to have some water and juice    Subjective  Medical Updates Since Last Visit/Relevant PMH Affecting Clinical Decision Making: Tammy Braun is a 83 y.o. female with PMH bladder cancer with cystectomy and ileal conduit, COPD, recent dx of afib, moderate-severe TR, CAD (high calcification score), T2DM presented to [facility] with projectile vomiting and abdominal pain, found to have SBO with transition point at level of ileostomy on CT A/P. On initial presentation, labs remarkable for leukocytosis thought to be iso hemoconcentration, AKI on CKD, mildly elevated venous lactate improved with resuscitation. Admitted to [facility] for worsening hypoxia on HFNC requiring intubation in setting of hiatal hernia. OR on 02/28/24 for  ex-lap, reduciton of parastomal hernia, small bowel resection with primary anastomosis and lysis of adhesions.    Communication Preference: Verbal  Patient/Caregiver Reports: gimme some more of that water!  Pain: no s/s of pain    Allergies: Lisinopril, Losartan, and Hctz [hydrochlorothiazide]  Current Facility-Administered Medications   Medication Dose Route Frequency Provider Last Rate Last Admin    acetaminophen (TYLENOL) tablet 1,000 mg  1,000 mg Enteral tube: gastric Q8H SCH Antony Haste, MD   1,000 mg at 03/24/24 1346    albuterol 2.5 mg /3 mL (0.083 %) nebulizer solution 2.5 mg  2.5 mg Nebulization Q6H PRN Oliver Hum, MD   2.5 mg at 02/28/24 1322    amiodarone (PACERONE) tablet 200 mg  200 mg Enteral tube: gastric Daily Lala Lund, ACNP   200 mg at 03/24/24 0904    arformoterol (BROVANA) nebulizer solution 15 mcg/2 mL  15 mcg Nebulization BID (RT) Antony Haste, MD   15 mcg at 03/24/24 4403    aspirin chewable tablet 81 mg  81 mg Enteral tube: gastric Daily Armandina Gemma, ACNP   81 mg at 03/24/24 0904    atorvastatin (LIPITOR) tablet 40 mg  40 mg Enteral tube: gastric Daily Bhatt, Priyanka, ACNP   40 mg at 03/24/24 4742    calcium gluc in NaCl, iso-osm 1 gram/50 mL IVPB 1 g  1 g Intravenous Q12H PRN Oliver Hum, MD        calcium gluconate in sodium chloride (NS) 0.9% 2 gram/100 mL IVPB 2 g  2 g Intravenous Q12H PRN Oliver Hum, MD        carboxymethylcellulose sodium (THERATEARS) 0.25 % ophthalmic solution 2 drop  2 drop Both Eyes TID Lala Lund, ACNP   2 drop at 03/23/24 2131    cholecalciferol (vitamin D3) 10 mcg/mL (400 unit/mL) oral solution  50 mcg Oral Daily Oliver Hum, MD   50 mcg at 03/24/24 0904    dextrose (D10W) 10% bolus 125 mL  12.5 g Intravenous Q10 Min PRN Oliver Hum, MD   125 mL at 03/23/24 1856    flu vaccine [id number] (68YR UP)(PF)(FLUZONE HIGH DOSE)  0.5 mL Intramuscular During hospitalization Oliver Hum, MD        fluPHENAZine decanoate (PROLIXIN) injection 50 mg  50 mg Intramuscular Q30 Days Niehuus, Maceo Pro, MD   50 mg at 03/13/24 0235    heparin (porcine) 1000 unit/mL injection 1,200 Units  1,200 Units Intravenous Q1H PRN Lala Lund, ACNP   1,200 Units at 03/10/24 0120    heparin (porcine) 1000 unit/mL injection 2,000 Units  2,000 Units Intravenous Q6H PRN Armandina Gemma, ACNP   2,000 Units at 03/13/24 1226    heparin 25,000 Units/250 mL (100 units/mL) in 0.45% saline infusion (premade)  0-24 Units/kg/hr Intravenous Continuous Bhatt, Priyanka, ACNP 10.88 mL/hr at 03/24/24 1037 16 Units/kg/hr at 03/24/24 1037    HYDROmorphone (PF) (DILAUDID) injection Syrg 0.5 mg  0.5 mg Intravenous Q3H PRN Lala Lund, ACNP        insulin NPH (HumuLIN,NovoLIN) injection 15 Units  15 Units Subcutaneous BID Belva Bertin, MD   15 Units at 03/23/24 0646    insulin regular (HumuLIN,NovoLIN) injection 0-20 Units  0-20 Units Subcutaneous Q6H SCH Gracelyn Nurse L, NP   4 Units at 03/24/24 1250    ipratropium (ATROVENT) 0.02 % nebulizer solution 500 mcg  500 mcg Nebulization Q6H (RT) Tally Due, PA   500 mcg at 03/24/24 0452    magnesium  sulfate in water 2 gram/50 mL (4 %) IVPB 2 g  2 g Intravenous Q2H PRN Oliver Hum, MD   Stopped at 03/20/24 1526    melatonin tablet 3 mg  3 mg Enteral tube: gastric Nightly PRN Tally Due, PA   3 mg at 03/23/24 2349    methocarbamol (ROBAXIN) tablet 500 mg  500 mg Enteral tube: gastric Q8H SCH Lala Lund, ACNP   500 mg at 03/24/24 1346    ondansetron [facility]) injection 4 mg  4 mg Intravenous Q6H PRN Oliver Hum, MD   4 mg at 03/11/24 1154    potassium chloride 20 mEq in 100 mL IVPB Premix  20 mEq Intravenous Q1H PRN Oliver Hum, MD   Stopped at 03/20/24 1256    sodium chloride (NS) 0.9 % flush 10 mL  10 mL Intravenous Q8H Ra, Vevelyn Francois, MD   10 mL at 03/24/24 1347    sodium chloride (NS) 0.9 % flush 10 mL  10 mL Intravenous Q8H Ra, Vevelyn Francois, MD   10 mL at 03/24/24 1347    sodium chloride (NS) 0.9 % flush 10 mL  10 mL Intravenous Q8H Ra, Vevelyn Francois, MD   10 mL at 03/24/24 1347    sodium chloride 3 % NEBULIZER solution 4 mL  4 mL Nebulization Q6H (RT) Maryann Alar, PA   4 mL at 03/24/24 0453     Facility-Administered Medications Ordered in Other Encounters   Medication Dose Route Frequency Provider Last Rate Last Admin    EPINEPHrine 100 mcg/10 mL (10 mcg/mL) injection   Intravenous PRN (once a day) Ichite, Precious C, MD   10 mcg at 02/28/24 1511     Past Medical History:   Diagnosis Date    Anxiety     Arthritis     At risk for falls     Breast cyst     Cancer     bladder    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    Chronic kidney disease     Depression, psychotic     Diabetes mellitus     in past    Emphysema of lung     Financial difficulties     Frail elderly     Hearing impairment     Hernia     History of transfusion     Hyperlipidemia     Hypertension     Impaired mobility     Osteoporosis     Pulmonary emphysema 05/08/2015    Visual impairment      Family History   Problem Relation Age of Onset    Breast cancer Daughter 11    Diabetes Mother     Glaucoma Father     Colon cancer Neg Hx     Ovarian cancer Neg Hx     Endometrial cancer Neg Hx     Anesthesia problems Neg Hx     Bleeding Disorder Neg Hx      Past Surgical History:   Procedure Laterality Date    ABDOMINAL SURGERY      BLADDER SURGERY      BREAST CYST EXCISION      CHEMOTHERAPY  2012    bladder    GALLBLADDER SURGERY      stone removal    ILEOSTOMY  2012    PR COLONOSCOPY FLX DX W/COLLJ SPEC WHEN PFRMD N/A 02/09/2015    Procedure: COLONOSCOPY, FLEXIBLE, PROXIMAL TO SPLENIC FLEXURE; DIAGNOSTIC, W/WO COLLECTION SPECIMEN BY BRUSH OR WASH;  Surgeon: Dewaine Conger, MD;  Location: [facility];  Service: Gastroenterology    PR EXPLORATORY OF ABDOMEN N/A 02/28/2024    Procedure: EXPLORATORY LAPAROTOMY, EXPLORATORY CELIOTOMY WITH OR WITHOUT BIOPSY(S);  Surgeon: Renda Rolls, MD;  Location: [facility];  Service: General Surgery    PR RECONSTR TOTAL SHOULDER IMPLANT Left 04/08/2018 Procedure: ARTHROPLASTY, GLENOHUMERAL JOINT; TOTAL SHOULDER(GLENOID & PROXIMAL HUMERAL REPLACEMENT(EG, TOTAL SHOULDER);  Surgeon: Tomasa Rand, MD;  [facility] OR [facility];  Service: Ortho Sports Medicine    PR REOPEN RECENT ABD EXPLORATORY Midline 03/01/2024    Procedure: REOPENING OF RECENT LAPAROTOMY;  Surgeon: [name]., MD;  Location: OR [facility];  Service: Trauma    PR REOPEN RECENT ABD EXPLORATORY N/A 03/03/2024    Procedure: REOPENING OF RECENT LAPAROTOMY;  Surgeon: Joanie Coddington, MD;  Location: OR [facility];  Service: Trauma    PR SIGMOIDOSCOPY,BIOPSY N/A 03/11/2015    Procedure: Arnell Sieving; WITH BIOPSY, SINGLE OR MULTIPLE;  Surgeon: Wilburt Finlay, MD;  [facility];  Service: Gastroenterology    PR XCAPSL CTRC RMVL INSJ IO LENS PROSTH W/O ECP Left 06/05/2022    Procedure: EXTRACAPSULAR CATARACT REMOVAL W/INSERTION OF INTRAOCULAR LENS PROSTHESIS, MANUAL OR MECHANICAL TECHNIQUE WITHOUT ENDOSCOPIC CYCLOPHOTOCOAGULATION;  Surgeon: Garner Gavel, MD;  Location: [facility] OR [facility];  Service: Ophthalmology    PR XCAPSL CTRC RMVL INSJ IO LENS PROSTH W/O ECP Right 06/19/2022    Procedure: EXTRACAPSULAR CATARACT REMOVAL W/INSERTION OF INTRAOCULAR LENS PROSTHESIS, MANUAL OR MECHANICAL TECHNIQUE WITHOUT ENDOSCOPIC CYCLOPHOTOCOAGULATION;  Surgeon: Garner Gavel, MD;  Location: [facility] OR [facility];  Service: Ophthalmology     Social History     Tobacco Use    Smoking status: Every Day     Current packs/day: 1.00     Average packs/day: 1 pack/day for 66.0 years (66.0 ttl pk-yrs)     Types: Cigarettes     Start date: 04/09/1958    Smokeless tobacco: Never    Tobacco comments:     15cpd   Substance Use Topics    Alcohol use: No     Alcohol/week: 0.0 standard drinks of alcohol     General:                    Self-Feeding Capacity: Impaired for self-feeding                   Medical Tests / Procedures Comments: CXR 3/25: Small bilateral pleural effusion with adjacent atelectasis.  Large hiatal hernia.  Equipment/Environment: NGT, Telemetry, Supplemental oxygen    Precautions / Restrictions  Precautions: Falls precautions, Isolation precautions, Aspiration precautions  Weight Bearing Status: Non-applicable  Required Braces or Orthoses: Non-applicable  Precautions / Restrictions comments: abdominal    Objective  Temperature Spikes Noted: No  Respiratory Status : O2 via nasal cannula  History of Intubation: Yes  Length of Intubations (days): 17 days  Date extubated: 03/22/24    Behavior/Cognition: Alert, Cooperative, Pleasant mood  Positioning : Upright in bed    Oral / Motor Exam  Vocal Quality: Normal  Volitional Swallow: Within Functional Limits   Labial ROM: Within Functional Limits   Labial Symmetry: Within Functional Limits  Labial Strength: Within Functional Limits   Lingual ROM: Within Functional Limits  Lingual Symmetry: Within Functional Limits  Lingual Strength: Within Functional Limits      Velum: elevation grossly WNL   Mandible: Within Functional Limits  Coordination: intact  Facial  ROM: Within Functional Limits   Facial Symmetry: Within Functional Limits  Facial Strength: Within Functional Limits      Vocal Intensity: Within Functional Limits       Apraxia: None present   Dysarthria: None present   Intelligibility: Intelligibility reduced   Breath Support: Adequate for speech   Dentition: Edentulous    Consistencies assessed: ice chips, thin liquid via spoon, straw, and cup, moderately thick liquids via spoon and straw    Patient at end of session: All needs in reach, In bed, Notified Nurse, Notified Provider    Speech Therapy Session Duration  SLP Individual [mins]: 12    I attest that I have reviewed the above information.  Signed: Nonie Hoyer, SLP    Ceasar Mons 03/24/2024

## 2024-03-25 ENCOUNTER — Ambulatory Visit: Admit: 2024-03-25 | Payer: MEDICARE

## 2024-03-25 LAB — PHOSPHORUS
PHOSPHORUS: 2.4 mg/dL (ref 2.4–5.1)
PHOSPHORUS: 2.6 mg/dL (ref 2.4–5.1)

## 2024-03-25 LAB — BASIC METABOLIC PANEL
ANION GAP: 12 mmol/L (ref 5–14)
ANION GAP: 11 mmol/L (ref 5–14)
BLOOD UREA NITROGEN: 54 mg/dL — ABNORMAL HIGH (ref 9–23)
BLOOD UREA NITROGEN: 59 mg/dL — ABNORMAL HIGH (ref 9–23)
BUN / CREAT RATIO: 48
BUN / CREAT RATIO: 53
CALCIUM: 8.9 mg/dL (ref 8.7–10.4)
CALCIUM: 8.8 mg/dL (ref 8.7–10.4)
CHLORIDE: 110 mmol/L — ABNORMAL HIGH (ref 98–107)
CHLORIDE: 109 mmol/L — ABNORMAL HIGH (ref 98–107)
CO2: 24 mmol/L (ref 20.0–31.0)
CO2: 25 mmol/L (ref 20.0–31.0)
CREATININE: 1.13 mg/dL — ABNORMAL HIGH (ref 0.55–1.02)
CREATININE: 1.12 mg/dL — ABNORMAL HIGH (ref 0.55–1.02)
EGFR CKD-EPI (2021) FEMALE: 49 mL/min/{1.73_m2} — ABNORMAL LOW (ref >=60)
EGFR CKD-EPI (2021) FEMALE: 50 mL/min/{1.73_m2} — ABNORMAL LOW (ref >=60)
GLUCOSE RANDOM: 169 mg/dL (ref 70–179)
GLUCOSE RANDOM: 238 mg/dL — ABNORMAL HIGH (ref 70–179)
POTASSIUM: 3.8 mmol/L (ref 3.4–4.8)
POTASSIUM: 4 mmol/L (ref 3.4–4.8)
SODIUM: 146 mmol/L — ABNORMAL HIGH (ref 135–145)
SODIUM: 145 mmol/L (ref 135–145)

## 2024-03-25 LAB — CBC
HEMATOCRIT: 27.8 % — ABNORMAL LOW (ref 34.0–44.0)
HEMOGLOBIN: 8.5 g/dL — ABNORMAL LOW (ref 11.3–14.9)
MEAN CORPUSCULAR HEMOGLOBIN CONC: 30.5 g/dL — ABNORMAL LOW (ref 32.0–36.0)
MEAN CORPUSCULAR HEMOGLOBIN: 28 pg (ref 25.9–32.4)
MEAN CORPUSCULAR VOLUME: 91.7 fL (ref 77.6–95.7)
MEAN PLATELET VOLUME: 10.1 fL (ref 6.8–10.7)
PLATELET COUNT: 245 10*9/L (ref 150–450)
RED BLOOD CELL COUNT: 3.04 10*12/L — ABNORMAL LOW (ref 3.95–5.13)
RED CELL DISTRIBUTION WIDTH: 19.8 % — ABNORMAL HIGH (ref 12.2–15.2)
WBC ADJUSTED: 8.1 10*9/L (ref 3.6–11.2)

## 2024-03-25 LAB — APTT
APTT: 53 s — ABNORMAL HIGH (ref 24.8–38.4)
HEPARIN CORRELATION: 0.3

## 2024-03-25 LAB — MAGNESIUM
MAGNESIUM: 2 mg/dL (ref 1.6–2.6)
MAGNESIUM: 1.9 mg/dL (ref 1.6–2.6)

## 2024-03-25 MED ADMIN — methocarbamol (ROBAXIN) tablet 500 mg: 500 mg | GASTROENTERAL | @ 02:00:00

## 2024-03-25 MED ADMIN — sodium chloride 3 % NEBULIZER solution 4 mL: 4 mL | RESPIRATORY_TRACT | @ 15:00:00

## 2024-03-25 MED ADMIN — ipratropium (ATROVENT) 0.02 % nebulizer solution 500 mcg: 500 ug | RESPIRATORY_TRACT | @ 02:00:00

## 2024-03-25 MED ADMIN — arformoterol (BROVANA) nebulizer solution 15 mcg/2 mL: 15 ug | RESPIRATORY_TRACT | @ 02:00:00

## 2024-03-25 MED ADMIN — atorvastatin (LIPITOR) tablet 40 mg: 40 mg | GASTROENTERAL | @ 13:00:00

## 2024-03-25 MED ADMIN — ipratropium (ATROVENT) 0.02 % nebulizer solution 500 mcg: 500 ug | RESPIRATORY_TRACT | @ 20:00:00

## 2024-03-25 MED ADMIN — sodium chloride 3 % NEBULIZER solution 4 mL: 4 mL | RESPIRATORY_TRACT | @ 20:00:00

## 2024-03-25 MED ADMIN — sodium chloride 3 % NEBULIZER solution 4 mL: 4 mL | RESPIRATORY_TRACT | @ 02:00:00

## 2024-03-25 MED ADMIN — carboxymethylcellulose sodium (THERATEARS) 0.25 % ophthalmic solution 2 drop: 2 [drp] | OPHTHALMIC | @ 17:00:00

## 2024-03-25 MED ADMIN — heparin 25,000 Units/250 mL (100 units/mL) in 0.45% saline infusion (premade): 0-24 [IU]/kg/h | INTRAVENOUS | @ 16:00:00

## 2024-03-25 MED ADMIN — carboxymethylcellulose sodium (THERATEARS) 0.25 % ophthalmic solution 2 drop: 2 [drp] | OPHTHALMIC | @ 13:00:00

## 2024-03-25 MED ADMIN — insulin regular (HumuLIN,NovoLIN) injection 0-20 Units: 0-20 [IU] | SUBCUTANEOUS | @ 11:00:00

## 2024-03-25 MED ADMIN — methocarbamol (ROBAXIN) tablet 500 mg: 500 mg | GASTROENTERAL | @ 17:00:00

## 2024-03-25 MED ADMIN — loperamide (IMODIUM) oral solution: 2 mg | GASTROENTERAL | @ 21:00:00

## 2024-03-25 MED ADMIN — cholecalciferol (vitamin D3) 10 mcg/mL (400 unit/mL) oral solution: 50 ug | ORAL | @ 13:00:00 | Stop: 2024-03-25

## 2024-03-25 MED ADMIN — insulin NPH (HumuLIN,NovoLIN) injection 15 Units: 15 [IU] | SUBCUTANEOUS | @ 11:00:00

## 2024-03-25 MED ADMIN — insulin regular (HumuLIN,NovoLIN) injection 0-20 Units: 0-20 [IU] | SUBCUTANEOUS | @ 22:00:00

## 2024-03-25 MED ADMIN — ipratropium (ATROVENT) 0.02 % nebulizer solution 500 mcg: 500 ug | RESPIRATORY_TRACT | @ 15:00:00

## 2024-03-25 MED ADMIN — sodium chloride (NS) 0.9 % flush 10 mL: 10 mL | INTRAVENOUS | @ 17:00:00 | Stop: 2024-03-25

## 2024-03-25 MED ADMIN — acetaminophen (TYLENOL) tablet 1,000 mg: 1000 mg | GASTROENTERAL | @ 14:00:00

## 2024-03-25 MED ADMIN — arformoterol (BROVANA) nebulizer solution 15 mcg/2 mL: 15 ug | RESPIRATORY_TRACT | @ 15:00:00

## 2024-03-25 MED ADMIN — aspirin chewable tablet 81 mg: 81 mg | GASTROENTERAL | @ 13:00:00

## 2024-03-25 MED ADMIN — furosemide (LASIX) injection 40 mg: 40 mg | INTRAVENOUS | @ 14:00:00 | Stop: 2024-03-25

## 2024-03-25 MED ADMIN — insulin NPH (HumuLIN,NovoLIN) injection 15 Units: 15 [IU] | SUBCUTANEOUS | @ 22:00:00

## 2024-03-25 MED ADMIN — methocarbamol (ROBAXIN) tablet 500 mg: 500 mg | GASTROENTERAL | @ 11:00:00

## 2024-03-25 MED ADMIN — amiodarone (PACERONE) oral suspension: 200 mg | GASTROENTERAL | @ 13:00:00

## 2024-03-25 MED ADMIN — loperamide (IMODIUM) oral solution: 2 mg | GASTROENTERAL | @ 14:00:00

## 2024-03-25 MED ADMIN — carboxymethylcellulose sodium (THERATEARS) 0.25 % ophthalmic solution 2 drop: 2 [drp] | OPHTHALMIC | @ 02:00:00

## 2024-03-25 MED ADMIN — furosemide (LASIX) injection 40 mg: 40 mg | INTRAVENOUS | @ 21:00:00 | Stop: 2024-03-25

## 2024-03-25 NOTE — Unmapped (Signed)
 [facility]U PROGRESS NOTE     Date of Service: 03/25/2024    Hospital Day: LOS: 27 days        Surgery Date: TBD   Surgical Attending: Lawrence Marseilles Day Thurnell Lose, MD    Critical Care Attending: Tad Moore, MD    Interval History:   HDS. No diuresis in last 24 hr due to soft BP. SLP evaluated 3/25, NPO with ice chips prn. C. Diff test sent given multiple BM, negative. Weaned to 4L nasal cannula. Imodium started and decreased Vitamin D drop frequency to see if helps with diarrhea     History of Present Illness:   Tammy Braun is a 83 y.o. female with PMH bladder cancer with cystectomy and ileal conduit, COPD, recent dx of afib, moderate-severe TR, CAD (high calcification score), T2DM presented to [facility] with projectile vomiting and abdominal pain, found to have SBO with transition point at level of ileostomy on CT A/P. On initial presentation, labs remarkable for leukocytosis thought to be iso hemoconcentration, AKI on CKD, mildly elevated venous lactate improved with resuscitation. Admitted to [facility] for worsening hypoxia on HFNC requiring intubation in setting of hiatal hernia. OR on 02/28/24 for ex-lap, reduciton of parastomal hernia, small bowel resection with primary anastomosis and lysis of adhesions.    Hospital Course:  - 02/26/24: Admitted to [facility], floor status, consult to urology for possible ileal conduit revision.   - 02/27/24: Patient level of care escalated to step-down iso increased oxygen needs while in ED.  - 02/28/24: Upgraded to ICU. Intubated at bed-side. OR for ex-lap, reduction of parastomal hernia, small bowel resection with primary anastomosis, lysis of adhesions, abdomen left open with ABThera in place.   - 03/01/24: OR for attempt at abdominal closure. Left open due to desaturations during closure attempt. ABThera in place.  - 03/03/24: RTOR for abdominal closure  -03/06/24: Vas cath placed, CRRT started   -03/09/24: Extubated to HFNC  -03/10/24: CRRT discontinued   - 03/11/24: Re-intubated for hypoxic respiratory failure 2/2 mucus plug   - 03/14/24 Extubated to HFNC   -03/15/24: Vas cath removed   -03/16/24: Placed on BiPap for complete collapse of L lung 2/2 probable mucus plugging  -03/19/24: Re-intubated for mucus plugging. Therapeutic bronch, BAL sent.   -03/22/24: Extubated to HFNC     ASSESSMENT & PLAN:     Neurologic:  - Pain: tylenol SCH    #schizoaffective disorder  - Monthly prolixin injection (given 3/14)    Cardiovascular:  - MAP goal > 60; HDS without vasopressors     #HFpEF  #Pulm HTN  - ECHO 3/12- LVEF 70%, moderately dilated RV with moderate systolic dysfunction, severe pulmonary hypertension.    #Hx of HTN  - holding home amlodipine given recent hypotension, restart when appropriate     #Hx of HLD  - atorvastatin    #Afib/flutter w RVR:   - Prior hx of paroxysmal afib. CHADSvasc = 6, no AC PTA per chart review.      > Holding home diltiazem, question prior efficacy given likely poor absorption  - Amio via NG tube 200mg  daily   - heparin gtt for [facility]     #Pulmonary embolism  - 3/5 CTA chest: Filling defect in L anterior segmental artery  - heparin gtt     #Acute thrombus right basilic   - Identified 3/3 on BUE duplex  - Supportive care, superficial     Respiratory:  #Acute on chronic hypoxemia  #Hx of COPD  - Aggressive pulmonary toilet  -  Continue Brovana & atrovent nebs  - Continue hypertonic saline nebs for clearance     #Aspiration  #Poor pulmonary clearance   - Re-intubated 3/21 for mucous plugging of L lung and inability to clear secretions. Extubated 3/23  - Wean supplemental oxygen as able, sat goal > 88%    #Pseudomonas Pneumonia, resolved  #VAP  - Completed antibiotics course    FEN/GI:  F: ML  E: Replete electrolytes PRN. Chem-10 BID.  N: NPO, TF via [place]. Will readdress long term feeding place with family.  - Speech therapy consult 3/25: NPO, ice chps PRN   - GI prophylaxis: not indicated   - Bowel regimen: held given diarrhea    - C. Diff negative   - Imodium 2 mg BID    - Switch Vitamin D drops to q48hr from daily     #Hypernatremia  - FWF to 75mL q1h     #S/p ex-lap, reduction of parastomal hernia, small bowel resection with primary anastomosis, lysis of adhesions  - WTD dressing changes daily per nursing to midline incision    #Large hiatal hernia, chronic  - re-eval on CT chest/abd/pelvis 3/13 stable    Renal/Genitourinary:  #AKI on CKD  - Nephrology signed off, HD cath removed  - Continue with spot dose Lasix -> 40 Lasix IV today, goal even to -      #H/o bladder cancer s/p radical cystectomy with ileal conduit (2012):  #Parastomal hernia:  - Strict I&Os  - Ok for no foley in stoma at this time per urology    Endocrine:   #hyperglycemia   - Glycemic Control: SSI, continue NPH 15 unit BID    Hematologic:   - Monitor CBC daily, transfuse for hgb 7mg /dL  - DVT ppx: heparin gtt for PE    Immunologic/Infectious Disease:  - Afebrile with resolved leukocytosis   - No antibiotics at this time,    - Cultures:   - C. Diff (3.26): negative   - Blood cultures (3/4): negative, final   - BAL (3/4): Pseudomonas aeruginosa - susceptible to meropenum    - BAL (3/8): Pseudomonas aeruginosa and candida glabrata  -3/12: Susceptibilities show resistance to Meropenum, Cefepime, Ceftaz and Zosyn.   - Bcx (3/11): Negative, final  - BAL (3/12): 600,000+ psuedomonas (susceptibilities: resistant to meropenum)  - Ucx (3/13): no growth  - BAL (3/20): <10,000 CFU  - ABD wound cx (3/21): 1+ candida glabrata     - Antimicrobials:    - Ceftriaxone (2/27- 2/28) for colonized UTI   - Zosyn (3/1 - 3/5) for abdominal contamination  - Zosyn (3/6) for pseudomonas when sensitivities remained pending  - Zerbaxa (3/12-3/21) for pseudomonas pna     Musculoskeletal:   #Deconditioned   - PT/OT consulted     Daily Care Checklist:   - Stress Ulcer Prevention: No  - DVT Prophylaxis: Chemical: Heparin drip  - Daily Awakening: N/A  - Spontaneous Breathing Trial: N/A  - Indication for Central/PICC Line: No  - Indication for Urinary Catheter: No   - Diagnostic images/reports of past 24hrs reviewed: Yes    Disposition:   - Continue ICU care  - PT/OT consulted:yes    Akeia Perot is critically ill due to acute respiratory failure, hypoxemia, fluid overload, COPD, SBO, prolonged NPO, malnutrition, ileostomy.  This critical care time includes examining the patient, evaluating the hemodynamic, laboratory, and radiographic data, independently developing a comprehensive management plan, and serially assessing the patient's response to these critical care interventions. This critical  care time excludes procedures.     Critical care time: 40 minutes     Cecile Hearing, [place]   Surgical Intensive Care Unit  [place] [facility]       SUBJECTIVE:      Resting, no complaints. On nasal cannula      OBJECTIVE:     Physical Exam:  Constitutional: Laying in bed, chronically ill appearing, no acute distress   Neurologic: AAOX2 self/place,follows commands, no focal deficits appreciated. Globally weak and deconditioned   Respiratory: on nasal cannula, no respiratory distress or increase work of breathing   Cardiovascular:  Irregularly irregular rhythm, 2+ edema in bilateral LE   Gastrointestinal: Soft abdomen, midline incision staples in tact, WTD packing in place. Urostomy draining clear yellow urine.   Musculoskeletal: Trace BLE/BUE edema.  Skin: Warm and dry     Temp:  [36.2 ??C (97.2 ??F)-36.8 ??C (98.2 ??F)] 36.2 ??C (97.2 ??F)  Heart Rate:  [98-122] 122  SpO2 Pulse:  [83-124] 124  Resp:  [15-30] 30  BP: (71-136)/(45-79) 108/72  MAP (mmHg):  [55-92] 84  FiO2 (%):  [30 %] 30 %  SpO2:  [88 %-100 %] 88 %     Recent Laboratory Results:  No results for input(s): SPECTYPEART, PHART, PCO2ART, PO2ART, HCO3ART, BEART, O2SATART in the last 24 hours.    Recent Labs     Units 03/23/24  2348 03/24/24  0359 03/24/24  1542   NA mmol/L 149* 147* 146*   K mmol/L 3.6 3.8 3.7   CL mmol/L 113* 111* 112*   CO2 mmol/L 27.0 27.0 27.0   BUN mg/dL 59* 62* 60*   CREATININE mg/dL 0.96* 0.45* 4.09*   GLU mg/dL 811 914 782*     Lab Results   Component Value Date    BILITOT 0.3 03/22/2024    BILITOT 0.3 03/15/2024    BILIDIR 0.10 03/22/2024    BILIDIR 0.20 03/15/2024    ALT <7 (L) 03/22/2024    ALT 10 03/15/2024    AST 9 03/22/2024    AST 13 03/15/2024    GGT 16 09/17/2011    GGT 354 (H) 05/03/2011    ALKPHOS 70 03/22/2024    ALKPHOS 60 03/15/2024    PROT 6.5 03/22/2024    PROT 5.7 03/15/2024    ALBUMIN 2.0 (L) 03/22/2024    ALBUMIN 1.9 (L) 03/15/2024     Recent Labs     Units 03/24/24  1246 03/24/24  1647 03/25/24  0006   POCGLU mg/dL 956* 213 086     Recent Labs     Units 03/22/24  0444 03/23/24  0528 03/24/24  0359   WBC 10*9/L 11.5* 10.0 7.7   RBC 10*12/L 2.78* 2.66* 2.61*   HGB g/dL 7.6* 7.9* 7.4*   HCT % 24.0* 24.3* 23.0*   MCV fL 86.1 91.3 88.2   MCH pg 27.5 29.5 28.2   MCHC g/dL 57.8* 46.9 62.9   RDW % 18.2* 18.7* 19.0*   PLT 10*9/L 268 254 253   MPV fL 9.9 9.8 9.9     No results for input(s): INR, APTT in the last 24 hours.    Invalid input(s): PTPATIENT      Lines & Tubes:   Patient Lines/Drains/Airways Status       Active Peripheral & Central Intravenous Access       Name Placement date Placement time Site Days    Peripheral IV 03/22/24 Left;Posterior Hand 03/22/24  1556  Hand  2  Peripheral IV 03/23/24 Posterior;Right Hand 03/23/24  1755  Hand  1                     Patient Lines/Drains/Airways Status       Active Wounds       Name Placement date Placement time Site Days    Surgical Site 04/08/18 Shoulder Left 04/08/18  1338  -- 2177    Surgical Site 06/05/22 Eye Left 06/05/22  0928  -- 658    Surgical Site 06/19/22 Eye Right 06/19/22  1024  -- 644    Surgical Site 03/03/24 Abdomen 03/03/24  1013  -- 21    Wound 06/24/20 Other (comment) Buttocks inner right buttock 06/24/20  1010  Buttocks  1369    Wound 03/13/24 Neck Right open area note arount the HD catheter durng the dressing change 03/13/24  0930  Neck  11 Respiratory/ventilator settings for last 24 hours:   FiO2 (%): 30 %    Intake/Output last 3 shifts:  I/O last 3 completed shifts:  In: 3409.3 [I.V.:669.3; NG/GT:2740]  Out: 2405 [Urine:2405]    Daily/Recent Weight:  65.2 kg (143 lb 11.8 oz)    BMI:  Body mass index is 27.17 kg/m??.    Medical History:  Past Medical History:   Diagnosis Date    Anxiety     Arthritis     At risk for falls     Breast cyst     Cancer     bladder    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    Chronic kidney disease     Depression, psychotic     Diabetes mellitus     in past    Emphysema of lung     Financial difficulties     Frail elderly     Hearing impairment     Hernia     History of transfusion     Hyperlipidemia     Hypertension     Impaired mobility     Osteoporosis     Pulmonary emphysema 05/08/2015    Visual impairment      Past Surgical History:   Procedure Laterality Date    ABDOMINAL SURGERY      BLADDER SURGERY      BREAST CYST EXCISION      CHEMOTHERAPY  2012    bladder    GALLBLADDER SURGERY      stone removal    ILEOSTOMY  2012    PR COLONOSCOPY FLX DX W/COLLJ SPEC WHEN PFRMD N/A 02/09/2015    Procedure: COLONOSCOPY, FLEXIBLE, PROXIMAL TO SPLENIC FLEXURE; DIAGNOSTIC, W/WO COLLECTION SPECIMEN BY BRUSH OR WASH;  Surgeon: Dewaine Conger, MD;  Location: [facility] [place];  Service: Gastroenterology    PR EXPLORATORY OF ABDOMEN N/A 02/28/2024    Procedure: EXPLORATORY LAPAROTOMY, EXPLORATORY CELIOTOMY WITH OR WITHOUT BIOPSY(S);  Surgeon: Renda Rolls, MD;  Location: [facility] OR [facility];  Service: General Surgery    PR RECONSTR TOTAL SHOULDER IMPLANT Left 04/08/2018    Procedure: ARTHROPLASTY, GLENOHUMERAL JOINT; TOTAL SHOULDER(GLENOID & PROXIMAL HUMERAL REPLACEMENT(EG, TOTAL SHOULDER);  Surgeon: Tomasa Rand, MD;  Location: [facility] OR [facility];  Service: Ortho Sports Medicine    PR REOPEN RECENT ABD EXPLORATORY [place] 03/01/2024    Procedure: REOPENING OF RECENT LAPAROTOMY;  Surgeon: [name]., MD;  Location: OR [facility];  Service: Trauma    PR REOPEN RECENT ABD EXPLORATORY N/A 03/03/2024    Procedure: REOPENING OF RECENT LAPAROTOMY;  Surgeon: Fredrik Rigger  Shelbie Ammons, MD;  Location: OR [facility];  Service: Trauma    PR SIGMOIDOSCOPY,BIOPSY N/A 03/11/2015    Procedure: SIGMOIDOSCOPY, FLEXIBLE; WITH BIOPSY, SINGLE OR MULTIPLE;  Surgeon: Wilburt Finlay, MD;  [facility];  Service: Gastroenterology    PR XCAPSL CTRC RMVL INSJ IO LENS PROSTH W/O ECP Left 06/05/2022    Procedure: EXTRACAPSULAR CATARACT REMOVAL W/INSERTION OF INTRAOCULAR LENS PROSTHESIS, MANUAL OR MECHANICAL TECHNIQUE WITHOUT ENDOSCOPIC CYCLOPHOTOCOAGULATION;  Surgeon: Garner Gavel, MD;  Location: [facility] OR [facility];  Service: Ophthalmology    PR XCAPSL CTRC RMVL INSJ IO LENS PROSTH W/O ECP Right 06/19/2022    Procedure: EXTRACAPSULAR CATARACT REMOVAL W/INSERTION OF INTRAOCULAR LENS PROSTHESIS, MANUAL OR MECHANICAL TECHNIQUE WITHOUT ENDOSCOPIC CYCLOPHOTOCOAGULATION;  Surgeon: Garner Gavel, MD;  [facility] OR [facility];  Service: Ophthalmology     Scheduled Medications:   amiodarone  200 mg Enteral tube: gastric Daily    arformoterol  15 mcg Nebulization BID (RT)    aspirin  81 mg Enteral tube: gastric Daily    atorvastatin  40 mg Enteral tube: gastric Daily    carboxymethylcellulose sodium  2 drop Both Eyes TID    cholecalciferol  50 mcg Oral Daily    flu vacc [id number](19yr up)-PF  0.5 mL Intramuscular During hospitalization    fluPHENAZine decanoate  50 mg Intramuscular Q30 Days    insulin NPH  15 Units Subcutaneous BID AC    insulin regular  0-20 Units Subcutaneous Q6H SCH    ipratropium  500 mcg Nebulization Q6H (RT)    methocarbamol  500 mg Enteral tube: gastric Q8H SCH    sodium chloride  10 mL Intravenous Q8H    sodium chloride  10 mL Intravenous Q8H    sodium chloride  10 mL Intravenous Q8H    sodium chloride  4 mL Nebulization Q6H (RT)     Continuous Infusions: heparin 16 Units/kg/hr (03/25/24 0400)     PRN Medications:  acetaminophen, albuterol, calcium gluconate, calcium gluconate, dextrose in water, heparin (porcine), heparin (porcine), HYDROmorphone, magnesium sulfate in water, melatonin, ondansetron, potassium chloride in water

## 2024-03-25 NOTE — Unmapped (Signed)
 Neuro: [id number], oriented to person and place, confused in conversation and requires repeated reminders in regard to NPO status     Resp: 4L [place], no desaturation issues     CV: Afib and tachycardia, BP WNL     GI: Tube feeds ongoing, mouth swabs provided to alleviate dry mouth     GU: 550 mL output from urostomy. Urostomy site clean and dry, yeast around site.     MSK: Worked with PT, pt able to provide minimal assistance with turns. Bath completed.        Patient and family updated to current condition and medications. Safety interventions in place as appropriate. Bed alarm on. No acute events over shift/Pt remains ICU status  Problem: Adult Inpatient Plan of Care  Goal: Plan of Care Review  Outcome: Ongoing - Unchanged  Goal: Patient-Specific Goal (Individualized)  Outcome: Ongoing - Unchanged  Goal: Absence of Hospital-Acquired Illness or Injury  Outcome: Ongoing - Unchanged  Intervention: Identify and Manage Fall Risk  Recent Flowsheet Documentation  Taken 03/25/2024 0800 by Assunta Curtis, RN  Safety Interventions:   aspiration precautions   bed alarm   infection management   low bed   isolation precautions  Intervention: Prevent Skin Injury  Recent Flowsheet Documentation  Taken 03/25/2024 1600 by Assunta Curtis, RN  Positioning for Skin: Left  Device Skin Pressure Protection:   absorbent pad utilized/changed   adhesive use limited   positioning supports utilized   pressure points protected   tubing/devices free from skin contact  Skin Protection:   adhesive use limited   incontinence pads utilized   pulse oximeter probe site changed   tubing/devices free from skin contact  Taken 03/25/2024 1400 by Assunta Curtis, RN  Positioning for Skin: Right  Device Skin Pressure Protection:   absorbent pad utilized/changed   adhesive use limited   positioning supports utilized   pressure points protected   tubing/devices free from skin contact  Skin Protection:   adhesive use limited   incontinence pads utilized   pulse oximeter probe site changed   tubing/devices free from skin contact  Taken 03/25/2024 1200 by Assunta Curtis, RN  Positioning for Skin: Left  Device Skin Pressure Protection:   absorbent pad utilized/changed   adhesive use limited   positioning supports utilized   pressure points protected   tubing/devices free from skin contact  Skin Protection:   adhesive use limited   incontinence pads utilized   pulse oximeter probe site changed   tubing/devices free from skin contact  Taken 03/25/2024 1000 by Assunta Curtis, RN  Positioning for Skin: Right  Device Skin Pressure Protection:   absorbent pad utilized/changed   adhesive use limited   positioning supports utilized   pressure points protected   tubing/devices free from skin contact  Skin Protection:   adhesive use limited   incontinence pads utilized   pulse oximeter probe site changed   tubing/devices free from skin contact  Taken 03/25/2024 0800 by Assunta Curtis, RN  Positioning for Skin: Left  Device Skin Pressure Protection:   absorbent pad utilized/changed   adhesive use limited   positioning supports utilized   pressure points protected   tubing/devices free from skin contact  Skin Protection:   adhesive use limited   incontinence pads utilized   pulse oximeter probe site changed   tubing/devices free from skin contact  Intervention: Prevent and Manage VTE (Venous Thromboembolism) Risk  Recent Flowsheet Documentation  Taken 03/25/2024 1600 by Assunta Curtis, RN  Anti-Embolism Device Type:  SCD, Knee  Anti-Embolism Device Status: On  Anti-Embolism Device Location: BLE  Taken 03/25/2024 1400 by Assunta Curtis, RN  Anti-Embolism Device Type: SCD, Knee  Anti-Embolism Device Status: On  Anti-Embolism Device Location: BLE  Taken 03/25/2024 1200 by Assunta Curtis, RN  Anti-Embolism Device Type: SCD, Knee  Anti-Embolism Device Status: On  Anti-Embolism Device Location: BLE  Taken 03/25/2024 1000 by Assunta Curtis, RN  Anti-Embolism Device Type: SCD, Knee  Anti-Embolism Device Status: On  Anti-Embolism Device Location: BLE  Taken 03/25/2024 0800 by Assunta Curtis, RN  Anti-Embolism Device Type: SCD, Knee  Anti-Embolism Device Status: On  Anti-Embolism Device Location: BLE  Intervention: Prevent Infection  Recent Flowsheet Documentation  Taken 03/25/2024 0800 by Assunta Curtis, RN  Infection Prevention:   hand hygiene promoted   rest/sleep promoted   single patient room provided  Goal: Optimal Comfort and Wellbeing  Outcome: Ongoing - Unchanged  Goal: Readiness for Transition of Care  Outcome: Ongoing - Unchanged  Goal: Rounds/Family Conference  Outcome: Ongoing - Unchanged     Problem: Infection  Goal: Absence of Infection Signs and Symptoms  Outcome: Ongoing - Unchanged     Problem: Fall Injury Risk  Goal: Absence of Fall and Fall-Related Injury  Outcome: Ongoing - Unchanged  Intervention: Promote Injury-Free Environment  Recent Flowsheet Documentation  Taken 03/25/2024 0800 by Assunta Curtis, RN  Safety Interventions:   aspiration precautions   bed alarm   infection management   low bed   isolation precautions     Problem: Wound  Goal: Optimal Coping  Outcome: Ongoing - Unchanged  Goal: Optimal Functional Ability  Outcome: Ongoing - Unchanged  Intervention: Optimize Functional Ability  Recent Flowsheet Documentation  Taken 03/25/2024 1600 by Assunta Curtis, RN  Activity Management: bedrest  Taken 03/25/2024 1400 by Assunta Curtis, RN  Activity Management: bedrest  Taken 03/25/2024 1200 by Assunta Curtis, RN  Activity Management: bedrest  Taken 03/25/2024 1000 by Assunta Curtis, RN  Activity Management: bedrest  Taken 03/25/2024 0800 by Assunta Curtis, RN  Activity Management: bedrest  Goal: Absence of Infection Signs and Symptoms  Outcome: Ongoing - Unchanged  Goal: Improved Oral Intake  Outcome: Ongoing - Unchanged  Goal: Optimal Pain Control and Function  Outcome: Ongoing - Unchanged  Intervention: Prevent or Manage Pain  Recent Flowsheet Documentation  Taken 03/25/2024 1600 by Assunta Curtis, RN  Sleep/Rest Enhancement:   regular sleep/rest pattern promoted   natural light exposure provided  Taken 03/25/2024 1400 by Assunta Curtis, RN  Sleep/Rest Enhancement:   regular sleep/rest pattern promoted   natural light exposure provided  Taken 03/25/2024 1200 by Assunta Curtis, RN  Sleep/Rest Enhancement:   regular sleep/rest pattern promoted   natural light exposure provided  Taken 03/25/2024 1000 by Assunta Curtis, RN  Sleep/Rest Enhancement:   regular sleep/rest pattern promoted   natural light exposure provided  Taken 03/25/2024 0800 by Assunta Curtis, RN  Sleep/Rest Enhancement:   regular sleep/rest pattern promoted   natural light exposure provided  Goal: Skin Health and Integrity  Outcome: Ongoing - Unchanged  Intervention: Optimize Skin Protection  Recent Flowsheet Documentation  Taken 03/25/2024 1600 by Assunta Curtis, RN  Activity Management: bedrest  Pressure Reduction Techniques:   frequent weight shift encouraged   pressure points protected   weight shift assistance provided  Head of Bed (HOB) Positioning: HOB at 30 degrees  Pressure Reduction Devices:   heel offloading device utilized   positioning supports utilized   pressure-redistributing mattress utilized   specialty bed utilized  Skin Protection:  adhesive use limited   incontinence pads utilized   pulse oximeter probe site changed   tubing/devices free from skin contact  Taken 03/25/2024 1400 by Assunta Curtis, RN  Activity Management: bedrest  Pressure Reduction Techniques:   frequent weight shift encouraged   pressure points protected   weight shift assistance provided  Head of Bed (HOB) Positioning: HOB at 30 degrees  Pressure Reduction Devices:   heel offloading device utilized   positioning supports utilized   pressure-redistributing mattress utilized   specialty bed utilized  Skin Protection:   adhesive use limited   incontinence pads utilized   pulse oximeter probe site changed   tubing/devices free from skin contact  Taken 03/25/2024 1200 by Assunta Curtis, RN  Activity Management: bedrest  Pressure Reduction Techniques:   frequent weight shift encouraged   pressure points protected   weight shift assistance provided  Head of Bed (HOB) Positioning: HOB at 30 degrees  Pressure Reduction Devices:   heel offloading device utilized   positioning supports utilized   pressure-redistributing mattress utilized   specialty bed utilized  Skin Protection:   adhesive use limited   incontinence pads utilized   pulse oximeter probe site changed   tubing/devices free from skin contact  Taken 03/25/2024 1000 by Assunta Curtis, RN  Activity Management: bedrest  Pressure Reduction Techniques:   frequent weight shift encouraged   pressure points protected   weight shift assistance provided  Head of Bed (HOB) Positioning: HOB at 30 degrees  Pressure Reduction Devices:   heel offloading device utilized   positioning supports utilized   pressure-redistributing mattress utilized   specialty bed utilized  Skin Protection:   adhesive use limited   incontinence pads utilized   pulse oximeter probe site changed   tubing/devices free from skin contact  Taken 03/25/2024 0800 by Assunta Curtis, RN  Activity Management: bedrest  Pressure Reduction Techniques:   frequent weight shift encouraged   pressure points protected   weight shift assistance provided  Head of Bed (HOB) Positioning: HOB at 30-45 degrees  Pressure Reduction Devices:   heel offloading device utilized   positioning supports utilized   pressure-redistributing mattress utilized   specialty bed utilized  Skin Protection:   adhesive use limited   incontinence pads utilized   pulse oximeter probe site changed   tubing/devices free from skin contact  Goal: Optimal Wound Healing  Outcome: Ongoing - Unchanged  Intervention: Promote Wound Healing  Recent Flowsheet Documentation  Taken 03/25/2024 1600 by Assunta Curtis, RN  Sleep/Rest Enhancement:   regular sleep/rest pattern promoted   natural light exposure provided  Taken 03/25/2024 1400 by Assunta Curtis, RN  Sleep/Rest Enhancement:   regular sleep/rest pattern promoted   natural light exposure provided  Taken 03/25/2024 1200 by Assunta Curtis, RN  Sleep/Rest Enhancement:   regular sleep/rest pattern promoted   natural light exposure provided  Taken 03/25/2024 1000 by Assunta Curtis, RN  Sleep/Rest Enhancement:   regular sleep/rest pattern promoted   natural light exposure provided  Taken 03/25/2024 0800 by Assunta Curtis, RN  Sleep/Rest Enhancement:   regular sleep/rest pattern promoted   natural light exposure provided     Problem: Skin Injury Risk Increased  Goal: Skin Health and Integrity  Outcome: Ongoing - Unchanged  Intervention: Optimize Skin Protection  Recent Flowsheet Documentation  Taken 03/25/2024 1600 by Assunta Curtis, RN  Activity Management: bedrest  Pressure Reduction Techniques:   frequent weight shift encouraged   pressure points protected   weight shift assistance provided  Head  of Bed (HOB) Positioning: HOB at 30 degrees  Pressure Reduction Devices:   heel offloading device utilized   positioning supports utilized   pressure-redistributing mattress utilized   specialty bed utilized  Skin Protection:   adhesive use limited   incontinence pads utilized   pulse oximeter probe site changed   tubing/devices free from skin contact  Taken 03/25/2024 1400 by Assunta Curtis, RN  Activity Management: bedrest  Pressure Reduction Techniques:   frequent weight shift encouraged   pressure points protected   weight shift assistance provided  Head of Bed (HOB) Positioning: HOB at 30 degrees  Pressure Reduction Devices:   heel offloading device utilized   positioning supports utilized   pressure-redistributing mattress utilized   specialty bed utilized  Skin Protection:   adhesive use limited   incontinence pads utilized   pulse oximeter probe site changed   tubing/devices free from skin contact  Taken 03/25/2024 1200 by Assunta Curtis, RN  Activity Management: bedrest  Pressure Reduction Techniques:   frequent weight shift encouraged   pressure points protected   weight shift assistance provided  Head of Bed (HOB) Positioning: HOB at 30 degrees  Pressure Reduction Devices:   heel offloading device utilized   positioning supports utilized   pressure-redistributing mattress utilized   specialty bed utilized  Skin Protection:   adhesive use limited   incontinence pads utilized   pulse oximeter probe site changed   tubing/devices free from skin contact  Taken 03/25/2024 1000 by Assunta Curtis, RN  Activity Management: bedrest  Pressure Reduction Techniques:   frequent weight shift encouraged   pressure points protected   weight shift assistance provided  Head of Bed (HOB) Positioning: HOB at 30 degrees  Pressure Reduction Devices:   heel offloading device utilized   positioning supports utilized   pressure-redistributing mattress utilized   specialty bed utilized  Skin Protection:   adhesive use limited   incontinence pads utilized   pulse oximeter probe site changed   tubing/devices free from skin contact  Taken 03/25/2024 0800 by Assunta Curtis, RN  Activity Management: bedrest  Pressure Reduction Techniques:   frequent weight shift encouraged   pressure points protected   weight shift assistance provided  Head of Bed (HOB) Positioning: HOB at 30-45 degrees  Pressure Reduction Devices:   heel offloading device utilized   positioning supports utilized   pressure-redistributing mattress utilized   specialty bed utilized  Skin Protection:   adhesive use limited   incontinence pads utilized   pulse oximeter probe site changed   tubing/devices free from skin contact     Problem: Self-Care Deficit  Goal: Improved Ability to Complete Activities of Daily Living  Outcome: Ongoing - Unchanged     Problem: Malnutrition  Goal: Improved Nutritional Intake  Outcome: Ongoing - Unchanged     Problem: Mechanical Ventilation Invasive  Goal: Absence of Device-Related Skin and Tissue Injury  Outcome: Ongoing - Unchanged  Intervention: Maintain Skin and Tissue Health  Recent Flowsheet Documentation  Taken 03/25/2024 1600 by Assunta Curtis, RN  Device Skin Pressure Protection:   absorbent pad utilized/changed   adhesive use limited   positioning supports utilized   pressure points protected   tubing/devices free from skin contact  Taken 03/25/2024 1400 by Assunta Curtis, RN  Device Skin Pressure Protection:   absorbent pad utilized/changed   adhesive use limited   positioning supports utilized   pressure points protected   tubing/devices free from skin contact  Taken 03/25/2024 1200 by Assunta Curtis, RN  Device Skin Pressure Protection:   absorbent pad utilized/changed   adhesive use limited   positioning supports utilized   pressure points protected   tubing/devices free from skin contact  Taken 03/25/2024 1000 by Assunta Curtis, RN  Device Skin Pressure Protection:   absorbent pad utilized/changed   adhesive use limited   positioning supports utilized   pressure points protected   tubing/devices free from skin contact  Taken 03/25/2024 0800 by Assunta Curtis, RN  Device Skin Pressure Protection:   absorbent pad utilized/changed   adhesive use limited   positioning supports utilized   pressure points protected   tubing/devices free from skin contact     Problem: Artificial Airway  Goal: Effective Communication  Outcome: Resolved  Goal: Optimal Device Function  Outcome: Resolved  Intervention: Optimize Device Care and Function  Recent Flowsheet Documentation  Taken 03/25/2024 1600 by Assunta Curtis, RN  Oral Care:   lip/mouth moisturizer applied   mouth swabbed   suction provided   tongue brushed  Taken 03/25/2024 1400 by Assunta Curtis, RN  Oral Care:   lip/mouth moisturizer applied   mouth swabbed   suction provided   tongue brushed  Taken 03/25/2024 1200 by Assunta Curtis, RN  Oral Care:   lip/mouth moisturizer applied   mouth swabbed   suction provided   tongue brushed  Taken 03/25/2024 1000 by Assunta Curtis, RN  Oral Care:   lip/mouth moisturizer applied   mouth swabbed   suction provided   tongue brushed  Taken 03/25/2024 0800 by Assunta Curtis, RN  Aspiration Precautions: tube feeding placement verified  Oral Care:   lip/mouth moisturizer applied   mouth swabbed   suction provided   tongue brushed  Airway Safety Measures: suction at bedside  Goal: Absence of Device-Related Skin or Tissue Injury  Outcome: Resolved  Intervention: Maintain Skin and Tissue Health  Recent Flowsheet Documentation  Taken 03/25/2024 1600 by Assunta Curtis, RN  Device Skin Pressure Protection:   absorbent pad utilized/changed   adhesive use limited   positioning supports utilized   pressure points protected   tubing/devices free from skin contact  Taken 03/25/2024 1400 by Assunta Curtis, RN  Device Skin Pressure Protection:   absorbent pad utilized/changed   adhesive use limited   positioning supports utilized   pressure points protected   tubing/devices free from skin contact  Taken 03/25/2024 1200 by Assunta Curtis, RN  Device Skin Pressure Protection:   absorbent pad utilized/changed   adhesive use limited   positioning supports utilized   pressure points protected   tubing/devices free from skin contact  Taken 03/25/2024 1000 by Assunta Curtis, RN  Device Skin Pressure Protection:   absorbent pad utilized/changed   adhesive use limited   positioning supports utilized   pressure points protected   tubing/devices free from skin contact  Taken 03/25/2024 0800 by Assunta Curtis, RN  Device Skin Pressure Protection:   absorbent pad utilized/changed   adhesive use limited   positioning supports utilized   pressure points protected tubing/devices free from skin contact     Problem: Mechanical Ventilation Invasive  Goal: Effective Communication  Outcome: Resolved  Goal: Optimal Device Function  Outcome: Resolved  Intervention: Optimize Device Care and Function  Recent Flowsheet Documentation  Taken 03/25/2024 1600 by Assunta Curtis, RN  Oral Care:   lip/mouth moisturizer applied   mouth swabbed   suction provided   tongue brushed  Taken 03/25/2024 1400 by Assunta Curtis, RN  Oral Care:   lip/mouth moisturizer applied   mouth  swabbed   suction provided   tongue brushed  Taken 03/25/2024 1200 by Assunta Curtis, RN  Oral Care:   lip/mouth moisturizer applied   mouth swabbed   suction provided   tongue brushed  Taken 03/25/2024 1000 by Assunta Curtis, RN  Oral Care:   lip/mouth moisturizer applied   mouth swabbed   suction provided   tongue brushed  Taken 03/25/2024 0800 by Assunta Curtis, RN  Oral Care:   lip/mouth moisturizer applied   mouth swabbed   suction provided   tongue brushed  Airway Safety Measures: suction at bedside  Goal: Mechanical Ventilation Liberation  Outcome: Resolved  Intervention: Promote Extubation and Mechanical Ventilation Liberation  Recent Flowsheet Documentation  Taken 03/25/2024 1600 by Assunta Curtis, RN  Sleep/Rest Enhancement:   regular sleep/rest pattern promoted   natural light exposure provided  Taken 03/25/2024 1400 by Assunta Curtis, RN  Sleep/Rest Enhancement:   regular sleep/rest pattern promoted   natural light exposure provided  Taken 03/25/2024 1200 by Assunta Curtis, RN  Sleep/Rest Enhancement:   regular sleep/rest pattern promoted   natural light exposure provided  Taken 03/25/2024 1000 by Assunta Curtis, RN  Sleep/Rest Enhancement:   regular sleep/rest pattern promoted   natural light exposure provided  Taken 03/25/2024 0800 by Assunta Curtis, RN  Sleep/Rest Enhancement:   regular sleep/rest pattern promoted   natural light exposure provided  Goal: Optimal Nutrition Delivery  Outcome: Resolved  Goal: Absence of Ventilator-Induced Lung Injury  Outcome: Resolved  Intervention: Prevent Ventilator-Associated Pneumonia  Recent Flowsheet Documentation  Taken 03/25/2024 1600 by Assunta Curtis, RN  Head of Bed [facility]) Positioning: HOB at 30 degrees  Oral Care:   lip/mouth moisturizer applied   mouth swabbed   suction provided   tongue brushed  Taken 03/25/2024 1400 by Assunta Curtis, RN  Head of Bed [facility]) Positioning: HOB at 30 degrees  Oral Care:   lip/mouth moisturizer applied   mouth swabbed   suction provided   tongue brushed  Taken 03/25/2024 1200 by Assunta Curtis, RN  Head of Bed [facility]) Positioning: HOB at 30 degrees  Oral Care:   lip/mouth moisturizer applied   mouth swabbed   suction provided   tongue brushed  Taken 03/25/2024 1000 by Assunta Curtis, RN  Head of Bed [facility]) Positioning: HOB at 30 degrees  Oral Care:   lip/mouth moisturizer applied   mouth swabbed   suction provided   tongue brushed  Taken 03/25/2024 0800 by Assunta Curtis, RN  Head of Bed [facility]) Positioning: HOB at 30-45 degrees  Oral Care:   lip/mouth moisturizer applied   mouth swabbed   suction provided   tongue brushed

## 2024-03-25 NOTE — Unmapped (Signed)
 Pt remains on [place] tolerating well. VSS and no distress noted. Txs given as ordered will continue to monitor closely.

## 2024-03-25 NOTE — Unmapped (Incomplete)
 [facility]U PROGRESS NOTE     Date of Service: 03/25/2024    Hospital Day: LOS: 27 days        Surgery Date: TBD   Surgical Attending: Lawrence Marseilles Day Thurnell Lose, MD    Critical Care Attending: Tad Moore, MD    Interval History:   HDS. No diuresis in last 24 hr due to soft BP. SLP evaluated 3/25, NPO with ice chips prn. C. Diff test sent given multiple BM, negative. Weaned to 4L nasal cannula. Imodium started and decreased Vitamin D drop frequency to see if helps with diarrhea     History of Present Illness:   Tammy Braun is a 83 y.o. female with PMH bladder cancer with cystectomy and ileal conduit, COPD, recent dx of afib, moderate-severe TR, CAD (high calcification score), T2DM presented to [facility] with projectile vomiting and abdominal pain, found to have SBO with transition point at level of ileostomy on CT A/P. On initial presentation, labs remarkable for leukocytosis thought to be iso hemoconcentration, AKI on CKD, mildly elevated venous lactate improved with resuscitation. Admitted to [facility] for worsening hypoxia on HFNC requiring intubation in setting of hiatal hernia. OR on 02/28/24 for ex-lap, reduciton of parastomal hernia, small bowel resection with primary anastomosis and lysis of adhesions.    Hospital Course:  - 02/26/24: Admitted to [facility], floor status, consult to urology for possible ileal conduit revision.   - 02/27/24: Patient level of care escalated to step-down iso increased oxygen needs while in ED.  - 02/28/24: Upgraded to ICU. Intubated at bed-side. OR for ex-lap, reduction of parastomal hernia, small bowel resection with primary anastomosis, lysis of adhesions, abdomen left open with ABThera in place.   - 03/01/24: OR for attempt at abdominal closure. Left open due to desaturations during closure attempt. ABThera in place.  - 03/03/24: RTOR for abdominal closure  -03/06/24: Vas cath placed, CRRT started   -03/09/24: Extubated to HFNC  -03/10/24: CRRT discontinued   - 03/11/24: Re-intubated for hypoxic respiratory failure 2/2 mucus plug   - 03/14/24 Extubated to HFNC   -03/15/24: Vas cath removed   -03/16/24: Placed on BiPap for complete collapse of L lung 2/2 probable mucus plugging  -03/19/24: Re-intubated for mucus plugging. Therapeutic bronch, BAL sent.   -03/22/24: Extubated to HFNC     ASSESSMENT & PLAN:     Neurologic:  - Pain: tylenol SCH    #schizoaffective disorder  - Monthly prolixin injection (given 3/14)    Cardiovascular:  - MAP goal > 60; HDS without vasopressors     #HFpEF  #Pulm HTN  - ECHO 3/12- LVEF 70%, moderately dilated RV with moderate systolic dysfunction, severe pulmonary hypertension.    #Hx of HTN  - holding home amlodipine given recent hypotension, restart when appropriate     #Hx of HLD  - atorvastatin    #Afib/flutter w RVR:   - Prior hx of paroxysmal afib. CHADSvasc = 6, no AC PTA per chart review.      > Holding home diltiazem, question prior efficacy given likely poor absorption  - Amio via NG tube 200mg  daily   - heparin gtt for [facility]     #Pulmonary embolism  - 3/5 CTA chest: Filling defect in L anterior segmental artery  - heparin gtt     #Acute thrombus right basilic   - Identified 3/3 on BUE duplex  - Supportive care, superficial     Respiratory:  #Acute on chronic hypoxemia  #Hx of COPD  - Aggressive pulmonary toilet  -  Continue Brovana & atrovent nebs  - Continue hypertonic saline nebs for clearance     #Aspiration  #Poor pulmonary clearance   - Re-intubated 3/21 for mucous plugging of L lung and inability to clear secretions. Extubated 3/23  - Wean supplemental oxygen as able, sat goal > 88%    #Pseudomonas Pneumonia, resolved  #VAP  - Completed antibiotics course    FEN/GI:  F: ML  E: Replete electrolytes PRN. Chem-10 BID.  N: NPO, TF via [place]. Will readdress long term feeding place with family.  - Speech therapy consult 3/25: NPO, ice chps PRN   - GI prophylaxis: not indicated   - Bowel regimen: held given diarrhea    - C. Diff negative   - Imodium 2 mg BID    - Switch Vitamin D drops to q48hr from daily     #Hypernatremia  - FWF to 75mL q1h     #S/p ex-lap, reduction of parastomal hernia, small bowel resection with primary anastomosis, lysis of adhesions  - WTD dressing changes daily per nursing to midline incision    #Large hiatal hernia, chronic  - re-eval on CT chest/abd/pelvis 3/13 stable    Renal/Genitourinary:  #AKI on CKD  - Nephrology signed off, HD cath removed  - Continue with spot dose Lasix -> 40 Lasix IV today, goal even to -      #H/o bladder cancer s/p radical cystectomy with ileal conduit (2012):  #Parastomal hernia:  - Strict I&Os  - Ok for no foley in stoma at this time per urology    Endocrine:   #hyperglycemia   - Glycemic Control: SSI, continue NPH 15 unit BID    Hematologic:   - Monitor CBC daily, transfuse for hgb 7mg /dL  - DVT ppx: heparin gtt for PE    Immunologic/Infectious Disease:  - Afebrile with resolved leukocytosis   - No antibiotics at this time,    - Cultures:   - C. Diff (3.26): negative   - Blood cultures (3/4): negative, final   - BAL (3/4): Pseudomonas aeruginosa - susceptible to meropenum    - BAL (3/8): Pseudomonas aeruginosa and candida glabrata  -3/12: Susceptibilities show resistance to Meropenum, Cefepime, Ceftaz and Zosyn.   - Bcx (3/11): Negative, final  - BAL (3/12): 600,000+ psuedomonas (susceptibilities: resistant to meropenum)  - Ucx (3/13): no growth  - BAL (3/20): <10,000 CFU  - ABD wound cx (3/21): 1+ candida glabrata     - Antimicrobials:    - Ceftriaxone (2/27- 2/28) for colonized UTI   - Zosyn (3/1 - 3/5) for abdominal contamination  - Zosyn (3/6) for pseudomonas when sensitivities remained pending  - Zerbaxa (3/12-3/21) for pseudomonas pna     Musculoskeletal:   #Deconditioned   - PT/OT consulted     Daily Care Checklist:   - Stress Ulcer Prevention: No  - DVT Prophylaxis: Chemical: Heparin drip  - Daily Awakening: N/A  - Spontaneous Breathing Trial: N/A  - Indication for Central/PICC Line: No  - Indication for Urinary Catheter: No   - Diagnostic images/reports of past 24hrs reviewed: Yes    Disposition:   - Continue ICU care  - PT/OT consulted:yes    Treesa Mccully is critically ill due to acute respiratory failure, hypoxemia, fluid overload, COPD, SBO, prolonged NPO, malnutrition, ileostomy.  This critical care time includes examining the patient, evaluating the hemodynamic, laboratory, and radiographic data, independently developing a comprehensive management plan, and serially assessing the patient's response to these critical care interventions. This critical  care time excludes procedures.     Critical care time: 40 minutes     Audry Pili, PA   Surgical Intensive Care Unit  [facility]       SUBJECTIVE:      Resting, no complaints. On nasal cannula      OBJECTIVE:     Physical Exam:  Constitutional: Laying in bed, chronically ill appearing, no acute distress   Neurologic: AAOX2 self/place,follows commands, no focal deficits appreciated. Globally weak and deconditioned   Respiratory: on nasal cannula, no respiratory distress or increase work of breathing   Cardiovascular:  Irregularly irregular rhythm, 2+ edema in bilateral LE   Gastrointestinal: Soft abdomen, midline incision staples in tact, WTD packing in place. Urostomy draining clear yellow urine.   Musculoskeletal: Trace BLE/BUE edema.  Skin: Warm and dry     Temp:  [36.2 ??C (97.2 ??F)-36.7 ??C (98 ??F)] 36.7 ??C (98 ??F)  Heart Rate:  [98-122] 119  SpO2 Pulse:  [83-124] 116  Resp:  [15-36] 29  BP: (91-137)/(54-87) 101/66  MAP (mmHg):  [64-106] 75  FiO2 (%):  [36 %] 36 %  SpO2:  [88 %-100 %] 96 %     Recent Laboratory Results:  No results for input(s): SPECTYPEART, PHART, PCO2ART, PO2ART, HCO3ART, BEART, O2SATART in the last 24 hours.    Recent Labs     Units 03/24/24  1542 03/25/24  0622 03/25/24  1646   NA mmol/L 146* 146* 145   K mmol/L 3.7 3.8 4.0   CL mmol/L 112* 110* 109*   CO2 mmol/L 27.0 24.0 25.0   BUN mg/dL 60* 54* 59*   CREATININE mg/dL 5.62* 1.30* 8.65*   GLU mg/dL 784* 696 295*     Lab Results   Component Value Date    BILITOT 0.3 03/22/2024    BILITOT 0.3 03/15/2024    BILIDIR 0.10 03/22/2024    BILIDIR 0.20 03/15/2024    ALT <7 (L) 03/22/2024    ALT 10 03/15/2024    AST 9 03/22/2024    AST 13 03/15/2024    GGT 16 09/17/2011    GGT 354 (H) 05/03/2011    ALKPHOS 70 03/22/2024    ALKPHOS 60 03/15/2024    PROT 6.5 03/22/2024    PROT 5.7 03/15/2024    ALBUMIN 2.0 (L) 03/22/2024    ALBUMIN 1.9 (L) 03/15/2024     Recent Labs     Units 03/25/24  0006 03/25/24  0649 03/25/24  1147 03/25/24  1740   POCGLU mg/dL 284 132* 440 102*     Recent Labs     Units 03/23/24  0528 03/24/24  0359 03/25/24  0622   WBC 10*9/L 10.0 7.7 8.1   RBC 10*12/L 2.66* 2.61* 3.04*   HGB g/dL 7.9* 7.4* 8.5*   HCT % 24.3* 23.0* 27.8*   MCV fL 91.3 88.2 91.7   MCH pg 29.5 28.2 28.0   MCHC g/dL 72.5 36.6 44.0*   RDW % 18.7* 19.0* 19.8*   PLT 10*9/L 254 253 245   MPV fL 9.8 9.9 10.1     Recent Labs     Units 03/25/24  0622   APTT sec 53.0*         Lines & Tubes:   Patient Lines/Drains/Airways Status       Active Peripheral & Central Intravenous Access       Name Placement date Placement time Site Days    Peripheral IV 03/22/24 Left;Posterior Hand 03/22/24  1556  Hand  3    Peripheral IV 03/23/24 Posterior;Right Hand 03/23/24  1755  Hand  2                     Patient Lines/Drains/Airways Status       Active Wounds       Name Placement date Placement time Site Days    Surgical Site 04/08/18 Shoulder Left 04/08/18  1338  -- 2178    Surgical Site 06/05/22 Eye Left 06/05/22  0928  -- 659    Surgical Site 06/19/22 Eye Right 06/19/22  1024  -- 645    Surgical Site 03/03/24 Abdomen 03/03/24  1013  -- 22    Wound 06/24/20 Other (comment) Buttocks inner right buttock 06/24/20  1010  Buttocks  1370    Wound 03/13/24 Neck Right open area note arount the HD catheter durng the dressing change 03/13/24  0930  Neck  12                     Respiratory/ventilator settings for last 24 hours:   FiO2 (%): 36 %    Intake/Output last 3 shifts:  I/O last 3 completed shifts:  In: 4491.7 [I.V.:421.7; NG/GT:4070]  Out: 1875 [Urine:1875]    Daily/Recent Weight:  65.2 kg (143 lb 11.8 oz)    BMI:  Body mass index is 27.17 kg/m??.    Medical History:  Past Medical History:   Diagnosis Date    Anxiety     Arthritis     At risk for falls     Breast cyst     Cancer     bladder    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    Chronic kidney disease     Depression, psychotic     Diabetes mellitus     in past    Emphysema of lung     Financial difficulties     Frail elderly     Hearing impairment     Hernia     History of transfusion     Hyperlipidemia     Hypertension     Impaired mobility     Osteoporosis     Pulmonary emphysema 05/08/2015    Visual impairment      Past Surgical History:   Procedure Laterality Date    ABDOMINAL SURGERY      BLADDER SURGERY      BREAST CYST EXCISION      CHEMOTHERAPY  2012    bladder    GALLBLADDER SURGERY      stone removal    ILEOSTOMY  2012    PR COLONOSCOPY FLX DX W/COLLJ SPEC WHEN PFRMD N/A 02/09/2015    Procedure: COLONOSCOPY, FLEXIBLE, PROXIMAL TO SPLENIC FLEXURE; DIAGNOSTIC, W/WO COLLECTION SPECIMEN BY BRUSH OR WASH;  Surgeon: Dewaine Conger, MD;  Location: [facility] [place];  Service: Gastroenterology    PR EXPLORATORY OF ABDOMEN N/A 02/28/2024    Procedure: EXPLORATORY LAPAROTOMY, EXPLORATORY CELIOTOMY WITH OR WITHOUT BIOPSY(S);  Surgeon: Renda Rolls, MD;  [facility];  Service: General Surgery    PR RECONSTR TOTAL SHOULDER IMPLANT Left 04/08/2018    Procedure: ARTHROPLASTY, GLENOHUMERAL JOINT; TOTAL SHOULDER(GLENOID & PROXIMAL HUMERAL REPLACEMENT(EG, TOTAL SHOULDER);  Surgeon: Tomasa Rand, MD;  Location: [facility];  Service: Ortho Sports Medicine    PR REOPEN RECENT ABD EXPLORATORY Midline 03/01/2024    Procedure: REOPENING OF RECENT LAPAROTOMY;  Surgeon: [name]., MD;  Location: OR [facility];  Service: Trauma    PR REOPEN RECENT ABD EXPLORATORY N/A 03/03/2024    Procedure: REOPENING OF RECENT LAPAROTOMY;  Surgeon: Joanie Coddington, MD;  Location: OR [facility];  Service: Trauma    PR SIGMOIDOSCOPY,BIOPSY N/A 03/11/2015    Procedure: SIGMOIDOSCOPY, FLEXIBLE; WITH BIOPSY, SINGLE OR MULTIPLE;  Surgeon: Wilburt Finlay, MD;  [facility];  Service: Gastroenterology    PR XCAPSL CTRC RMVL INSJ IO LENS PROSTH W/O ECP Left 06/05/2022    Procedure: EXTRACAPSULAR CATARACT REMOVAL W/INSERTION OF INTRAOCULAR LENS PROSTHESIS, MANUAL OR MECHANICAL TECHNIQUE WITHOUT ENDOSCOPIC CYCLOPHOTOCOAGULATION;  Surgeon: Garner Gavel, MD;  Location: [facility] OR [facility];  Service: Ophthalmology    PR XCAPSL CTRC RMVL INSJ IO LENS PROSTH W/O ECP Right 06/19/2022    Procedure: EXTRACAPSULAR CATARACT REMOVAL W/INSERTION OF INTRAOCULAR LENS PROSTHESIS, MANUAL OR MECHANICAL TECHNIQUE WITHOUT ENDOSCOPIC CYCLOPHOTOCOAGULATION;  Surgeon: Garner Gavel, MD;  [facility] OR [facility];  Service: Ophthalmology     Scheduled Medications:   amiodarone  200 mg Enteral tube: gastric Daily    arformoterol  15 mcg Nebulization BID (RT)    aspirin  81 mg Enteral tube: gastric Daily    atorvastatin  40 mg Enteral tube: gastric Daily    carboxymethylcellulose sodium  2 drop Both Eyes TID    [START ON 03/27/2024] cholecalciferol  50 mcg Enteral tube: gastric Q48H    flu vacc [id number](70yr up)-PF  0.5 mL Intramuscular During hospitalization    fluPHENAZine decanoate  50 mg Intramuscular Q30 Days    insulin NPH  15 Units Subcutaneous BID AC    insulin regular  0-20 Units Subcutaneous Q6H SCH    ipratropium  500 mcg Nebulization Q6H (RT)    loperamide  2 mg Enteral tube: gastric BID    methocarbamol  500 mg Enteral tube: gastric Q8H SCH    sodium chloride  10 mL Intravenous Q8H    sodium chloride  10 mL Intravenous Q8H    sodium chloride  10 mL Intravenous Q8H    sodium chloride  4 mL Nebulization Q6H (RT)     Continuous Infusions:   heparin 16 Units/kg/hr (03/25/24 1800)     PRN Medications:  acetaminophen, albuterol, calcium gluconate, calcium gluconate, dextrose in water, heparin (porcine), heparin (porcine), magnesium sulfate in water, melatonin, ondansetron, potassium chloride in water

## 2024-03-25 NOTE — Unmapped (Signed)
 Neuro: Pt intermittently A&O x 4. Consistently alert to self/place. MAE. Pupils equal round and reactive.    Resp: Remains on 4L/[place] with SpO2 within goal.    CV: Remains in Afib rhythm. BP within goals.     GI: Corpak remains in place; TF running at goal with no issues.     GU: Adequate UOP from urostomy. No issues noted.    Patient and family updated to current condition and medications. Safety interventions in place as appropriate. Bed alarm on. No acute events over shift/Pt remains ICU status.      Problem: Adult Inpatient Plan of Care  Goal: Plan of Care Review  Outcome: Progressing  Goal: Patient-Specific Goal (Individualized)  Outcome: Progressing  Goal: Absence of Hospital-Acquired Illness or Injury  Outcome: Progressing  Intervention: Identify and Manage Fall Risk  Recent Flowsheet Documentation  Taken 03/24/2024 2000 by Dot Been, RN  Safety Interventions:   bed alarm   environmental modification   fall reduction program maintained   infection management   lighting adjusted for tasks/safety   low bed   muscle strengthening facilitated   nonskid shoes/slippers when out of bed   supervised activity   toileting scheduled   family at bedside   bleeding precautions   enteral feeding safety  Intervention: Prevent Skin Injury  Recent Flowsheet Documentation  Taken 03/25/2024 0200 by Dot Been, RN  Positioning for Skin: Right  Taken 03/25/2024 0000 by Dot Been, RN  Positioning for Skin: Left  Taken 03/24/2024 2200 by Dot Been, RN  Positioning for Skin: Right  Taken 03/24/2024 2000 by Dot Been, RN  Positioning for Skin: Left  Device Skin Pressure Protection:   absorbent pad utilized/changed   adhesive use limited  Skin Protection:   adhesive use limited   cleansing with dimethicone incontinence wipes  Intervention: Prevent and Manage VTE (Venous Thromboembolism) Risk  Recent Flowsheet Documentation  Taken 03/25/2024 0200 by Dot Been, RN  Anti-Embolism Device Type: SCD, Knee  Anti-Embolism Device Status: (education provided) Refused  Anti-Embolism Device Location: BLE  Taken 03/25/2024 0000 by Dot Been, RN  Anti-Embolism Device Type: SCD, Knee  Anti-Embolism Device Status: (education provided) Refused  Anti-Embolism Device Location: BLE  Taken 03/24/2024 2200 by Dot Been, RN  Anti-Embolism Device Type: SCD, Knee  Anti-Embolism Device Status: (education provided) Refused  Anti-Embolism Device Location: BLE  Taken 03/24/2024 2000 by Dot Been, RN  Anti-Embolism Device Type: SCD, Knee  Anti-Embolism Device Status: (education provided) Refused  Anti-Embolism Device Location: BLE  Intervention: Prevent Infection  Recent Flowsheet Documentation  Taken 03/24/2024 2000 by Dot Been, RN  Infection Prevention:   cohorting utilized   environmental surveillance performed   equipment surfaces disinfected   hand hygiene promoted   personal protective equipment utilized   rest/sleep promoted   single patient room provided  Goal: Optimal Comfort and Wellbeing  Outcome: Progressing  Goal: Readiness for Transition of Care  Outcome: Progressing  Goal: Rounds/Family Conference  Outcome: Progressing     Problem: Infection  Goal: Absence of Infection Signs and Symptoms  Outcome: Progressing  Intervention: Prevent or Manage Infection  Recent Flowsheet Documentation  Taken 03/24/2024 2000 by Dot Been, RN  Infection Management: aseptic technique maintained  Isolation Precautions: contact precautions maintained     Problem: Fall Injury Risk  Goal: Absence of Fall and Fall-Related Injury  Outcome: Progressing  Intervention: Promote Scientist, clinical (histocompatibility and immunogenetics) Documentation  Taken 03/24/2024 2000 by Stanton Kidney  D, RN  Safety Interventions:   bed alarm   environmental modification   fall reduction program maintained   infection management   lighting adjusted for tasks/safety   low bed   muscle strengthening facilitated   nonskid shoes/slippers when out of bed   supervised activity   toileting scheduled   family at bedside   bleeding precautions   enteral feeding safety     Problem: Wound  Goal: Optimal Coping  Outcome: Progressing  Goal: Optimal Functional Ability  Outcome: Progressing  Intervention: Optimize Functional Ability  Recent Flowsheet Documentation  Taken 03/24/2024 2000 by Dot Been, RN  Activity Management: dorsiflexion/plantar flexion performed  Goal: Absence of Infection Signs and Symptoms  Outcome: Progressing  Intervention: Prevent or Manage Infection  Recent Flowsheet Documentation  Taken 03/24/2024 2000 by Dot Been, RN  Infection Management: aseptic technique maintained  Isolation Precautions: contact precautions maintained  Goal: Improved Oral Intake  Outcome: Progressing  Goal: Optimal Pain Control and Function  Outcome: Progressing  Intervention: Prevent or Manage Pain  Recent Flowsheet Documentation  Taken 03/24/2024 2000 by Dot Been, RN  Sleep/Rest Enhancement:   awakenings minimized   consistent schedule promoted   natural light exposure provided   noise level reduced   regular sleep/rest pattern promoted   relaxation techniques promoted   room darkened   therapeutic touch utilized  Goal: Skin Health and Integrity  Outcome: Progressing  Intervention: Optimize Skin Protection  Recent Flowsheet Documentation  Taken 03/25/2024 0200 by Dot Been, RN  Head of Bed [facility]) Positioning: HOB at 30-45 degrees  Taken 03/25/2024 0000 by Dot Been, RN  Head of Bed [facility] Positioning: HOB at 30-45 degrees  Taken 03/24/2024 2200 by Dot Been, RN  Head of Bed [facility]) Positioning: HOB at 30-45 degrees  Taken 03/24/2024 2000 by Dot Been, RN  Activity Management: dorsiflexion/plantar flexion performed  Pressure Reduction Techniques:   frequent weight shift encouraged   heels elevated off bed   pressure points protected   positioned off wounds   weight shift assistance provided   sit time limited to 2 hours  Head of Bed (HOB) Positioning: HOB at 30-45 degrees  Pressure Reduction Devices:   foam padding utilized   positioning supports utilized   pressure-redistributing mattress utilized  Skin Protection:   adhesive use limited   cleansing with dimethicone incontinence wipes  Goal: Optimal Wound Healing  Outcome: Progressing  Intervention: Promote Wound Healing  Recent Flowsheet Documentation  Taken 03/24/2024 2000 by Dot Been, RN  Sleep/Rest Enhancement:   awakenings minimized   consistent schedule promoted   natural light exposure provided   noise level reduced   regular sleep/rest pattern promoted   relaxation techniques promoted   room darkened   therapeutic touch utilized     Problem: Skin Injury Risk Increased  Goal: Skin Health and Integrity  Outcome: Progressing  Intervention: Optimize Skin Protection  Recent Flowsheet Documentation  Taken 03/25/2024 0200 by Dot Been, RN  Head of Bed [facility]) Positioning: HOB at 30-45 degrees  Taken 03/25/2024 0000 by Dot Been, RN  Head of Bed [facility]) Positioning: HOB at 30-45 degrees  Taken 03/24/2024 2200 by Dot Been, RN  Head of Bed [facility]) Positioning: HOB at 30-45 degrees  Taken 03/24/2024 2000 by Dot Been, RN  Activity Management: dorsiflexion/plantar flexion performed  Pressure Reduction Techniques:   frequent weight shift encouraged   heels elevated off bed   pressure points protected  positioned off wounds   weight shift assistance provided   sit time limited to 2 hours  Head of Bed (HOB) Positioning: HOB at 30-45 degrees  Pressure Reduction Devices:   foam padding utilized   positioning supports utilized   pressure-redistributing mattress utilized  Skin Protection:   adhesive use limited   cleansing with dimethicone incontinence wipes     Problem: Self-Care Deficit  Goal: Improved Ability to Complete Activities of Daily Living  Outcome: Progressing     Problem: Artificial Airway  Goal: Effective Communication  Outcome: Progressing  Goal: Optimal Device Function  Outcome: Progressing  Intervention: Optimize Device Care and Function  Recent Flowsheet Documentation  Taken 03/25/2024 0200 by Dot Been, RN  Oral Care:   lip/mouth moisturizer applied   mouth swabbed   oral rinse provided   suction provided  Taken 03/25/2024 0000 by Dot Been, RN  Oral Care:   lip/mouth moisturizer applied   mouth swabbed   oral rinse provided   suction provided   teeth brushed   tongue brushed  Taken 03/24/2024 2200 by Dot Been, RN  Oral Care:   mouth swabbed   lip/mouth moisturizer applied  Taken 03/24/2024 2000 by Dot Been, RN  Aspiration Precautions:   respiratory status monitored   upright posture maintained   oral hygiene care promoted  Oral Care:   lip/mouth moisturizer applied   mouth swabbed   oral rinse provided   suction provided   teeth brushed   tongue brushed  Airway Safety Measures:   mask valve resuscitator at bedside   manual resuscitator/mask at bedside   suction at bedside  Goal: Absence of Device-Related Skin or Tissue Injury  Outcome: Progressing  Intervention: Maintain Skin and Tissue Health  Recent Flowsheet Documentation  Taken 03/24/2024 2000 by Dot Been, RN  Device Skin Pressure Protection:   absorbent pad utilized/changed   adhesive use limited     Problem: Malnutrition  Goal: Improved Nutritional Intake  Outcome: Progressing     Problem: Mechanical Ventilation Invasive  Goal: Effective Communication  Outcome: Progressing  Goal: Optimal Device Function  Outcome: Progressing  Intervention: Optimize Device Care and Function  Recent Flowsheet Documentation  Taken 03/25/2024 0200 by Dot Been, RN  Oral Care:   lip/mouth moisturizer applied   mouth swabbed   oral rinse provided   suction provided  Taken 03/25/2024 0000 by Dot Been, RN  Oral Care:   lip/mouth moisturizer applied   mouth swabbed   oral rinse provided   suction provided   teeth brushed   tongue brushed  Taken 03/24/2024 2200 by Dot Been, RN  Oral Care:   mouth swabbed   lip/mouth moisturizer applied  Taken 03/24/2024 2000 by Dot Been, RN  Oral Care:   lip/mouth moisturizer applied   mouth swabbed   oral rinse provided   suction provided   teeth brushed   tongue brushed  Airway Safety Measures:   mask valve resuscitator at bedside   manual resuscitator/mask at bedside   suction at bedside  Goal: Mechanical Ventilation Liberation  Outcome: Progressing  Intervention: Promote Extubation and Mechanical Ventilation Liberation  Recent Flowsheet Documentation  Taken 03/24/2024 2000 by Dot Been, RN  Sleep/Rest Enhancement:   awakenings minimized   consistent schedule promoted   natural light exposure provided   noise level reduced   regular sleep/rest pattern promoted   relaxation techniques promoted   room darkened   therapeutic touch utilized  Goal:  Optimal Nutrition Delivery  Outcome: Progressing  Goal: Absence of Device-Related Skin and Tissue Injury  Outcome: Progressing  Intervention: Maintain Skin and Tissue Health  Recent Flowsheet Documentation  Taken 03/24/2024 2000 by Dot Been, RN  Device Skin Pressure Protection:   absorbent pad utilized/changed   adhesive use limited  Goal: Absence of Ventilator-Induced Lung Injury  Outcome: Progressing  Intervention: Prevent Ventilator-Associated Pneumonia  Recent Flowsheet Documentation  Taken 03/25/2024 0200 by Dot Been, RN  Head of Bed [facility]) Positioning: HOB at 30-45 degrees  Oral Care:   lip/mouth moisturizer applied   mouth swabbed   oral rinse provided   suction provided  Taken 03/25/2024 0000 by Dot Been, RN  Head of Bed [facility]) Positioning: HOB at 30-45 degrees  Oral Care:   lip/mouth moisturizer applied   mouth swabbed   oral rinse provided   suction provided   teeth brushed   tongue brushed  Taken 03/24/2024 2200 by Dot Been, RN  Head of Bed [facility]) Positioning: HOB at 30-45 degrees  Oral Care:   mouth swabbed   lip/mouth moisturizer applied  Taken 03/24/2024 2000 by Dot Been, RN  Head of Bed [facility]) Positioning: HOB at 30-45 degrees  Oral Care:   lip/mouth moisturizer applied   mouth swabbed   oral rinse provided   suction provided   teeth brushed   tongue brushed

## 2024-03-26 LAB — CBC
HEMATOCRIT: 23.5 % — ABNORMAL LOW (ref 34.0–44.0)
HEMOGLOBIN: 7.3 g/dL — ABNORMAL LOW (ref 11.3–14.9)
MEAN CORPUSCULAR HEMOGLOBIN CONC: 30.9 g/dL — ABNORMAL LOW (ref 32.0–36.0)
MEAN CORPUSCULAR HEMOGLOBIN: 28 pg (ref 25.9–32.4)
MEAN CORPUSCULAR VOLUME: 90.4 fL (ref 77.6–95.7)
MEAN PLATELET VOLUME: 10.2 fL (ref 6.8–10.7)
PLATELET COUNT: 241 10*9/L (ref 150–450)
RED BLOOD CELL COUNT: 2.6 10*12/L — ABNORMAL LOW (ref 3.95–5.13)
RED CELL DISTRIBUTION WIDTH: 21.1 % — ABNORMAL HIGH (ref 12.2–15.2)
WBC ADJUSTED: 11 10*9/L (ref 3.6–11.2)

## 2024-03-26 LAB — POTASSIUM: POTASSIUM: 4.3 mmol/L (ref 3.4–4.8)

## 2024-03-26 LAB — BASIC METABOLIC PANEL
ANION GAP: 11 mmol/L (ref 5–14)
BLOOD UREA NITROGEN: 60 mg/dL — ABNORMAL HIGH (ref 9–23)
BUN / CREAT RATIO: 57
CALCIUM: 9.1 mg/dL (ref 8.7–10.4)
CHLORIDE: 107 mmol/L (ref 98–107)
CO2: 27 mmol/L (ref 20.0–31.0)
CREATININE: 1.06 mg/dL — ABNORMAL HIGH (ref 0.55–1.02)
EGFR CKD-EPI (2021) FEMALE: 53 mL/min/{1.73_m2} — ABNORMAL LOW (ref >=60)
GLUCOSE RANDOM: 180 mg/dL — ABNORMAL HIGH (ref 70–179)
SODIUM: 145 mmol/L (ref 135–145)

## 2024-03-26 LAB — APTT
APTT: 131.8 s — ABNORMAL HIGH (ref 24.8–38.4)
APTT: 50.5 s — ABNORMAL HIGH (ref 24.8–38.4)
HEPARIN CORRELATION: 0.8
HEPARIN CORRELATION: 0.3

## 2024-03-26 LAB — MAGNESIUM
MAGNESIUM: 2.3 mg/dL (ref 1.6–2.6)
MAGNESIUM: 2.1 mg/dL (ref 1.6–2.6)

## 2024-03-26 LAB — ALBUMIN: ALBUMIN: 2.6 g/dL — ABNORMAL LOW (ref 3.4–5.0)

## 2024-03-26 LAB — PHOSPHORUS: PHOSPHORUS: 2.9 mg/dL (ref 2.4–5.1)

## 2024-03-26 MED ADMIN — sodium chloride 3 % NEBULIZER solution 4 mL: 4 mL | RESPIRATORY_TRACT | @ 14:00:00 | Stop: 2024-03-26

## 2024-03-26 MED ADMIN — atorvastatin (LIPITOR) tablet 40 mg: 40 mg | GASTROENTERAL | @ 12:00:00

## 2024-03-26 MED ADMIN — insulin regular (HumuLIN,NovoLIN) injection 0-20 Units: 0-20 [IU] | SUBCUTANEOUS | @ 10:00:00

## 2024-03-26 MED ADMIN — insulin NPH (HumuLIN,NovoLIN) injection 15 Units: 15 [IU] | SUBCUTANEOUS | @ 11:00:00

## 2024-03-26 MED ADMIN — ipratropium (ATROVENT) 0.02 % nebulizer solution 500 mcg: 500 ug | RESPIRATORY_TRACT | @ 14:00:00

## 2024-03-26 MED ADMIN — furosemide (LASIX) injection 40 mg: 40 mg | INTRAVENOUS | @ 02:00:00 | Stop: 2024-03-25

## 2024-03-26 MED ADMIN — methocarbamol (ROBAXIN) tablet 500 mg: 500 mg | GASTROENTERAL | @ 10:00:00

## 2024-03-26 MED ADMIN — melatonin tablet 3 mg: 3 mg | GASTROENTERAL | @ 02:00:00

## 2024-03-26 MED ADMIN — ipratropium (ATROVENT) 0.02 % nebulizer solution 500 mcg: 500 ug | RESPIRATORY_TRACT | @ 20:00:00

## 2024-03-26 MED ADMIN — methocarbamol (ROBAXIN) tablet 500 mg: 500 mg | GASTROENTERAL | @ 18:00:00

## 2024-03-26 MED ADMIN — methocarbamol (ROBAXIN) tablet 500 mg: 500 mg | GASTROENTERAL | @ 02:00:00

## 2024-03-26 MED ADMIN — amiodarone (PACERONE) oral suspension: 200 mg | GASTROENTERAL | @ 12:00:00

## 2024-03-26 MED ADMIN — magnesium sulfate in water 2 gram/50 mL (4 %) IVPB 2 g: 2 g | INTRAVENOUS | @ 03:00:00

## 2024-03-26 MED ADMIN — insulin regular (HumuLIN,NovoLIN) injection 0-20 Units: 0-20 [IU] | SUBCUTANEOUS | @ 21:00:00

## 2024-03-26 MED ADMIN — insulin NPH (HumuLIN,NovoLIN) injection 15 Units: 15 [IU] | SUBCUTANEOUS | @ 21:00:00

## 2024-03-26 MED ADMIN — heparin 25,000 Units/250 mL (100 units/mL) in 0.45% saline infusion (premade): 0-24 [IU]/kg/h | INTRAVENOUS | @ 17:00:00

## 2024-03-26 MED ADMIN — acetaminophen (TYLENOL) tablet 1,000 mg: 1000 mg | GASTROENTERAL

## 2024-03-26 MED ADMIN — furosemide (LASIX) injection 60 mg: 60 mg | INTRAVENOUS | @ 16:00:00 | Stop: 2024-03-26

## 2024-03-26 MED ADMIN — aspirin chewable tablet 81 mg: 81 mg | GASTROENTERAL | @ 12:00:00

## 2024-03-26 MED ADMIN — insulin regular (HumuLIN,NovoLIN) injection 0-20 Units: 0-20 [IU] | SUBCUTANEOUS | @ 17:00:00

## 2024-03-26 MED ADMIN — arformoterol (BROVANA) nebulizer solution 15 mcg/2 mL: 15 ug | RESPIRATORY_TRACT | @ 14:00:00

## 2024-03-26 MED ADMIN — carboxymethylcellulose sodium (THERATEARS) 0.25 % ophthalmic solution 2 drop: 2 [drp] | OPHTHALMIC | @ 12:00:00

## 2024-03-26 NOTE — Unmapped (Addendum)
 Pt alert, oriented x 2-3, forgetful at times. Follows commands. Remains in afib with rates in the 100s-110s. Normotensive and afebrile. Heparin gtt continued and therapeutic this evening. Remains on nasal cannula. Pt has productive cough. SLP did swallow eval at bedside this morning and recommended NPO with ice chips and barium swallow. No BM this shift, imodium discontinued. Urostomy bag changed. Tube feeds remain at goal through corpak. Pts daughter visiting this shift. Skin assessed by WOCN. Pt bathed this AM. Pt downgraded to intermediate status this afternoon.   Problem: Adult Inpatient Plan of Care  Goal: Plan of Care Review  Outcome: Ongoing - Unchanged  Goal: Patient-Specific Goal (Individualized)  Outcome: Ongoing - Unchanged  Goal: Absence of Hospital-Acquired Illness or Injury  Outcome: Ongoing - Unchanged  Intervention: Identify and Manage Fall Risk  Recent Flowsheet Documentation  Taken 03/26/2024 0800 by Jeanett Schlein, RN  Safety Interventions:   aspiration precautions   bed alarm   bleeding precautions   enteral feeding safety   fall reduction program maintained   infection management   isolation precautions   lighting adjusted for tasks/safety   low bed  Intervention: Prevent Skin Injury  Recent Flowsheet Documentation  Taken 03/26/2024 1800 by Jeanett Schlein, RN  Positioning for Skin: Right  Taken 03/26/2024 1600 by Jeanett Schlein, RN  Positioning for Skin: Left  Taken 03/26/2024 1400 by Jeanett Schlein, RN  Positioning for Skin: Right  Taken 03/26/2024 1200 by Jeanett Schlein, RN  Positioning for Skin: Left  Taken 03/26/2024 1000 by Jeanett Schlein, RN  Positioning for Skin: Right  Taken 03/26/2024 0800 by Jeanett Schlein, RN  Positioning for Skin: Left  Intervention: Prevent and Manage VTE (Venous Thromboembolism) Risk  Recent Flowsheet Documentation  Taken 03/26/2024 1800 by Jeanett Schlein, RN  Anti-Embolism Device Status: Other (Comment)  Taken 03/26/2024 1600 by Jeanett Schlein, RN  Anti-Embolism Device Status: Other (Comment)  Taken 03/26/2024 1400 by Jeanett Schlein, RN  Anti-Embolism Device Status: Other (Comment)  Taken 03/26/2024 1200 by Jeanett Schlein, RN  Anti-Embolism Device Status: Other (Comment)  Taken 03/26/2024 1000 by Jeanett Schlein, RN  Anti-Embolism Device Status: Other (Comment)  Taken 03/26/2024 0800 by Jeanett Schlein, RN  Anti-Embolism Device Status: Other (Comment)  Intervention: Prevent Infection  Recent Flowsheet Documentation  Taken 03/26/2024 0800 by Jeanett Schlein, RN  Infection Prevention: hand hygiene promoted  Goal: Optimal Comfort and Wellbeing  Outcome: Ongoing - Unchanged  Goal: Readiness for Transition of Care  Outcome: Ongoing - Unchanged  Goal: Rounds/Family Conference  Outcome: Ongoing - Unchanged     Problem: Infection  Goal: Absence of Infection Signs and Symptoms  Outcome: Ongoing - Unchanged  Intervention: Prevent or Manage Infection  Recent Flowsheet Documentation  Taken 03/26/2024 0800 by Jeanett Schlein, RN  Infection Management: aseptic technique maintained  Isolation Precautions: contact precautions maintained     Problem: Fall Injury Risk  Goal: Absence of Fall and Fall-Related Injury  Outcome: Ongoing - Unchanged  Intervention: Promote Injury-Free Environment  Recent Flowsheet Documentation  Taken 03/26/2024 0800 by Jeanett Schlein, RN  Safety Interventions:   aspiration precautions   bed alarm   bleeding precautions   enteral feeding safety   fall reduction program maintained   infection management   isolation precautions   lighting adjusted for tasks/safety   low bed     Problem: Wound  Goal: Optimal Coping  Outcome: Ongoing - Unchanged  Goal: Optimal Functional Ability  Outcome: Ongoing - Unchanged  Goal: Absence of Infection Signs and Symptoms  Outcome: Ongoing - Unchanged  Intervention: Prevent or Manage Infection  Recent Flowsheet Documentation  Taken 03/26/2024 0800 by Jeanett Schlein, RN  Infection Management: aseptic technique maintained  Isolation Precautions: contact precautions maintained  Goal: Improved Oral Intake  Outcome: Ongoing - Unchanged  Goal: Optimal Pain Control and Function  Outcome: Ongoing - Unchanged  Goal: Skin Health and Integrity  Outcome: Ongoing - Unchanged  Intervention: Optimize Skin Protection  Recent Flowsheet Documentation  Taken 03/26/2024 0800 by Jeanett Schlein, RN  Pressure Reduction Techniques:   frequent weight shift encouraged   heels elevated off bed   weight shift assistance provided  Head of Bed (HOB) Positioning: HOB at 30-45 degrees  Goal: Optimal Wound Healing  Outcome: Ongoing - Unchanged     Problem: Skin Injury Risk Increased  Goal: Skin Health and Integrity  Outcome: Ongoing - Unchanged  Intervention: Optimize Skin Protection  Recent Flowsheet Documentation  Taken 03/26/2024 0800 by Jeanett Schlein, RN  Pressure Reduction Techniques:   frequent weight shift encouraged   heels elevated off bed   weight shift assistance provided  Head of Bed (HOB) Positioning: HOB at 30-45 degrees     Problem: Self-Care Deficit  Goal: Improved Ability to Complete Activities of Daily Living  Outcome: Ongoing - Unchanged     Problem: Mechanical Ventilation Invasive  Goal: Optimal Device Function  Intervention: Optimize Device Care and Function  Recent Flowsheet Documentation  Taken 03/26/2024 0800 by Jeanett Schlein, RN  Oral Care:   mouth swabbed   tongue brushed   suction provided  Goal: Absence of Ventilator-Induced Lung Injury  Intervention: Prevent Ventilator-Associated Pneumonia  Recent Flowsheet Documentation  Taken 03/26/2024 0800 by Jeanett Schlein, RN  Head of Bed [facility]) Positioning: HOB at 30-45 degrees  Oral Care:   mouth swabbed   tongue brushed   suction provided     Problem: Mechanical Ventilation Invasive  Goal: Optimal Device Function  Intervention: Optimize Device Care and Function  Recent Flowsheet Documentation  Taken 03/26/2024 0800 by Jeanett Schlein, RN  Oral Care:   mouth swabbed   tongue brushed   suction provided  Goal: Absence of Device-Related Skin and Tissue Injury  Outcome: Ongoing - Unchanged  Goal: Absence of Ventilator-Induced Lung Injury  Intervention: Prevent Ventilator-Associated Pneumonia  Recent Flowsheet Documentation  Taken 03/26/2024 0800 by Jeanett Schlein, RN  Head of Bed [facility]) Positioning: HOB at 30-45 degrees  Oral Care:   mouth swabbed   tongue brushed   suction provided     Problem: Artificial Airway  Goal: Optimal Device Function  Intervention: Optimize Device Care and Function  Recent Flowsheet Documentation  Taken 03/26/2024 0800 by Jeanett Schlein, RN  Aspiration Precautions: tube feeding placement verified  Oral Care:   mouth swabbed   tongue brushed   suction provided     Problem: Malnutrition  Goal: Improved Nutritional Intake  Outcome: Ongoing - Unchanged

## 2024-03-26 NOTE — Unmapped (Signed)
 Neuro: Pt intermittently A&O x 4. Consistently alert to self/place. MAE. Pupils equal round and reactive.     Resp: Remains on 4L/[place] with SpO2 within goal.     CV: Remains in Afib rhythm. BP within goals.      GI: Corpak remains in place; TF running at goal with no issues.      GU: Adequate UOP from urostomy. No issues noted.    Dressing changed per order; Bed in chair position upon shift change and pt tolerating well.      Patient and family updated to current condition and medications. Safety interventions in place as appropriate. Bed alarm on. No acute events over shift/Pt remains ICU status.        Problem: Adult Inpatient Plan of Care  Goal: Plan of Care Review  Outcome: Progressing  Goal: Patient-Specific Goal (Individualized)  Outcome: Progressing  Goal: Absence of Hospital-Acquired Illness or Injury  Outcome: Progressing  Intervention: Identify and Manage Fall Risk  Recent Flowsheet Documentation  Taken 03/25/2024 2000 by Dot Been, RN  Safety Interventions:   bed alarm   environmental modification   fall reduction program maintained   infection management   lighting adjusted for tasks/safety   low bed   muscle strengthening facilitated   nonskid shoes/slippers when out of bed   supervised activity   toileting scheduled   enteral feeding safety   family at bedside  Intervention: Prevent Skin Injury  Recent Flowsheet Documentation  Taken 03/26/2024 0400 by Dot Been, RN  Positioning for Skin: Left  Taken 03/26/2024 0200 by Dot Been, RN  Positioning for Skin: Right  Taken 03/26/2024 0000 by Dot Been, RN  Positioning for Skin: Left  Taken 03/25/2024 2200 by Dot Been, RN  Positioning for Skin: Right  Taken 03/25/2024 2000 by Dot Been, RN  Positioning for Skin: Left  Device Skin Pressure Protection:   absorbent pad utilized/changed   adhesive use limited   positioning supports utilized   pressure points protected   skin-to-device areas padded   skin-to-skin areas padded   tubing/devices free from skin contact  Skin Protection:   adhesive use limited   cleansing with dimethicone incontinence wipes   incontinence pads utilized   pectin skin barriers applied   pouching devices used   protective footwear used   silicone foam dressing in place   skin sealant/moisture barrier applied   pulse oximeter probe site changed   skin-to-device areas padded   skin-to-skin areas padded   transparent dressing maintained   tubing/devices free from skin contact   zinc oxide barrier cream  Intervention: Prevent and Manage VTE (Venous Thromboembolism) Risk  Recent Flowsheet Documentation  Taken 03/26/2024 0400 by Dot Been, RN  Anti-Embolism Device Type: SCD, Knee  Anti-Embolism Device Status: On  Anti-Embolism Device Location: BLE  Taken 03/26/2024 0200 by Dot Been, RN  Anti-Embolism Device Type: SCD, Knee  Anti-Embolism Device Status: On  Anti-Embolism Device Location: BLE  Taken 03/26/2024 0000 by Dot Been, RN  Anti-Embolism Device Type: SCD, Knee  Anti-Embolism Device Status: On  Anti-Embolism Device Location: BLE  Taken 03/25/2024 2200 by Dot Been, RN  Anti-Embolism Device Type: SCD, Knee  Anti-Embolism Device Status: On  Anti-Embolism Device Location: BLE  Taken 03/25/2024 2000 by Dot Been, RN  Anti-Embolism Device Type: SCD, Knee  Anti-Embolism Device Status: On  Anti-Embolism Device Location: BLE  Intervention: Prevent Infection  Recent Flowsheet Documentation  Taken 03/25/2024 2000 by Stanton Kidney  D, RN  Infection Prevention:   cohorting utilized   environmental surveillance performed   equipment surfaces disinfected   hand hygiene promoted   personal protective equipment utilized   rest/sleep promoted   single patient room provided  Goal: Optimal Comfort and Wellbeing  Outcome: Progressing  Goal: Readiness for Transition of Care  Outcome: Progressing  Goal: Rounds/Family Conference  Outcome: Progressing     Problem: Infection  Goal: Absence of Infection Signs and Symptoms  Outcome: Progressing  Intervention: Prevent or Manage Infection  Recent Flowsheet Documentation  Taken 03/25/2024 2000 by Dot Been, RN  Infection Management: aseptic technique maintained  Isolation Precautions: contact precautions maintained     Problem: Fall Injury Risk  Goal: Absence of Fall and Fall-Related Injury  Outcome: Progressing  Intervention: Promote Scientist, clinical (histocompatibility and immunogenetics) Documentation  Taken 03/25/2024 2000 by Dot Been, RN  Safety Interventions:   bed alarm   environmental modification   fall reduction program maintained   infection management   lighting adjusted for tasks/safety   low bed   muscle strengthening facilitated   nonskid shoes/slippers when out of bed   supervised activity   toileting scheduled   enteral feeding safety   family at bedside     Problem: Wound  Goal: Optimal Coping  Outcome: Progressing  Goal: Optimal Functional Ability  Outcome: Progressing  Intervention: Optimize Functional Ability  Recent Flowsheet Documentation  Taken 03/25/2024 2000 by Dot Been, RN  Activity Management: dorsiflexion/plantar flexion performed  Goal: Absence of Infection Signs and Symptoms  Outcome: Progressing  Intervention: Prevent or Manage Infection  Recent Flowsheet Documentation  Taken 03/25/2024 2000 by Dot Been, RN  Infection Management: aseptic technique maintained  Isolation Precautions: contact precautions maintained  Goal: Improved Oral Intake  Outcome: Progressing  Goal: Optimal Pain Control and Function  Outcome: Progressing  Intervention: Prevent or Manage Pain  Recent Flowsheet Documentation  Taken 03/25/2024 2000 by Dot Been, RN  Sleep/Rest Enhancement:   awakenings minimized   consistent schedule promoted   natural light exposure provided   noise level reduced   regular sleep/rest pattern promoted   relaxation techniques promoted   room darkened   therapeutic touch utilized  Goal: Skin Health and Integrity  Outcome: Progressing  Intervention: Optimize Skin Protection  Recent Flowsheet Documentation  Taken 03/26/2024 0400 by Dot Been, RN  Head of Bed [facility]) Positioning: HOB at 30-45 degrees  Taken 03/26/2024 0200 by Dot Been, RN  Head of Bed [facility]) Positioning: HOB at 30-45 degrees  Taken 03/26/2024 0000 by Dot Been, RN  Head of Bed [facility]) Positioning: HOB at 30-45 degrees  Taken 03/25/2024 2200 by Dot Been, RN  Head of Bed [facility]) Positioning: HOB at 30-45 degrees  Taken 03/25/2024 2000 by Dot Been, RN  Activity Management: dorsiflexion/plantar flexion performed  Pressure Reduction Techniques:   frequent weight shift encouraged   heels elevated off bed   positioned off wounds   pressure points protected   sit time limited to 2 hours   weight shift assistance provided  Head of Bed (HOB) Positioning: HOB at 30-45 degrees  Pressure Reduction Devices:   foam padding utilized   positioning supports utilized   pressure-redistributing mattress utilized  Skin Protection:   adhesive use limited   cleansing with dimethicone incontinence wipes   incontinence pads utilized   pectin skin barriers applied   pouching devices used   protective footwear used   silicone foam dressing in  place   skin sealant/moisture barrier applied   pulse oximeter probe site changed   skin-to-device areas padded   skin-to-skin areas padded   transparent dressing maintained   tubing/devices free from skin contact   zinc oxide barrier cream  Goal: Optimal Wound Healing  Outcome: Progressing  Intervention: Promote Wound Healing  Recent Flowsheet Documentation  Taken 03/25/2024 2000 by Dot Been, RN  Sleep/Rest Enhancement:   awakenings minimized   consistent schedule promoted   natural light exposure provided   noise level reduced   regular sleep/rest pattern promoted   relaxation techniques promoted   room darkened   therapeutic touch utilized     Problem: Skin Injury Risk Increased  Goal: Skin Health and Integrity  Outcome: Progressing  Intervention: Optimize Skin Protection  Recent Flowsheet Documentation  Taken 03/26/2024 0400 by Dot Been, RN  Head of Bed [facility]) Positioning: HOB at 30-45 degrees  Taken 03/26/2024 0200 by Dot Been, RN  Head of Bed [facility]) Positioning: HOB at 30-45 degrees  Taken 03/26/2024 0000 by Dot Been, RN  Head of Bed [facility]) Positioning: HOB at 30-45 degrees  Taken 03/25/2024 2200 by Dot Been, RN  Head of Bed [facility]) Positioning: HOB at 30-45 degrees  Taken 03/25/2024 2000 by Dot Been, RN  Activity Management: dorsiflexion/plantar flexion performed  Pressure Reduction Techniques:   frequent weight shift encouraged   heels elevated off bed   positioned off wounds   pressure points protected   sit time limited to 2 hours   weight shift assistance provided  Head of Bed (HOB) Positioning: HOB at 30-45 degrees  Pressure Reduction Devices:   foam padding utilized   positioning supports utilized   pressure-redistributing mattress utilized  Skin Protection:   adhesive use limited   cleansing with dimethicone incontinence wipes   incontinence pads utilized   pectin skin barriers applied   pouching devices used   protective footwear used   silicone foam dressing in place   skin sealant/moisture barrier applied   pulse oximeter probe site changed   skin-to-device areas padded   skin-to-skin areas padded   transparent dressing maintained   tubing/devices free from skin contact   zinc oxide barrier cream     Problem: Self-Care Deficit  Goal: Improved Ability to Complete Activities of Daily Living  Outcome: Progressing     Problem: Malnutrition  Goal: Improved Nutritional Intake  Outcome: Progressing     Problem: Mechanical Ventilation Invasive  Goal: Absence of Device-Related Skin and Tissue Injury  Outcome: Progressing  Intervention: Maintain Skin and Tissue Health  Recent Flowsheet Documentation  Taken 03/25/2024 2000 by Dot Been, RN  Device Skin Pressure Protection:   absorbent pad utilized/changed   adhesive use limited   positioning supports utilized   pressure points protected   skin-to-device areas padded   skin-to-skin areas padded   tubing/devices free from skin contact

## 2024-03-26 NOTE — Unmapped (Signed)
 WOCN Consult Services                                                                 Wound Evaluation     Reason for Consult:   - Incontinence Associated Dermatitis  - Moisture Associated Skin Damage  - Re-consult    Problem List:   Principal Problem:    SBO (small bowel obstruction)    Assessment: Per EMR Tammy Braun is a 83 y.o. female with PMH bladder cancer with cystectomy and ileal conduit, COPD, recent dx of afib, moderate-severe TR, CAD (high calcification score), T2DM presented to [facility] with projectile vomiting and abdominal pain, found to have SBO with transition point at level of ileostomy on CT A/P. On initial presentation, labs remarkable for leukocytosis thought to be iso hemoconcentration, AKI on CKD, mildly elevated venous lactate improved with resuscitation. Admitted to [facility] for worsening hypoxia on HFNC requiring intubation in setting of hiatal hernia. OR on 02/28/24 for ex-lap, reduciton of parastomal hernia, small bowel resection with primary anastomosis and lysis of adhesions.     CWOCN consulted for potential pressure injury to coccyx. Pt seen in [facility], alert with family and primary nurse at bedside. Upon assessment, pt has a linear wound to intergluteal fold with white slough in wound bed. Pt known to [facility] team for same MASD wound. Wound had closed up per CWOCN note on 3/24 but has appeared to have re-opened. Recommend to crust open area and protect skin with zinc paste. Bilateral heels intact. Continue to follow Skin Integrity Protocol and reposition/offload pt frequently. Wound expected to heal with plan of care, WOC team will sign off at this time.      Wound 03/11/24 Other (comment) Sacrum Mid moisture/friction injury (Active)   Properties   Placement Date 03/11/24   Placement Time 1200   Location Sacrum   Present on Original Admission Y   Primary Wound Type Other (moisture/friction)   Wound Location Orientation Mid   Wound Description (Comments) moisture/friction injury   Medical Device Related Pressure Injury No   LIP Notified Of Pressure Injury No      Assessments 03/26/2024  2:15 PM   Wound Image     Dressing Status      Removed   State of Healing Eschar   Wound Length (cm) 2 cm   Wound Width (cm) 0.3 cm   Wound Depth (cm) 0.1 cm   Wound Surface Area (cm^2) 0.6 cm^2   Wound Volume (cm^3) 0.06 cm^3   Wound Bed White;Pink   Odor None   Peri-wound Assessment      Dry;Hyperpigmentation   Exudate Amnt      None   Tunneling      No   Undermining     No   Treatments Zinc based products;Cleansed/Irrigation   Dressing Moisture barrier cream         Continence Status:   Incontinence of bladder: Urostomy    Moisture Associated Skin Damage:   - Incontinence-associated dermatitis (IAD)     Lab Results   Component Value Date    WBC 11.0 03/26/2024    HGB 7.3 (L) 03/26/2024    HCT 23.5 (L) 03/26/2024    ESR 53 (H) 12/11/2023    CRP 18.5 (H) 03/20/2024  A1C 6.8 (H) 12/11/2023    GLUF 105 08/04/2013    GLU 180 (H) 03/26/2024    POCGLU 212 (H) 03/26/2024    ALBUMIN 2.6 (L) 03/26/2024    PROT 6.5 03/22/2024     Support Surface:   - Low Air Loss - ICU    Offloading:  Left: Heel Offloading Boot and Pillow  Right: Heel Offloading Boot and Pillow    Type Debridement Completed By WOCN:  N/A    Teaching:  - Moisture management  - Offloading  - Turning and repositioning    WOCN Recommendations:   - See nursing orders for wound care instructions.  - Contact WOCN with questions, concerns, or wound deterioration.    Topical Therapy/Interventions:   - Crusting (stoma powder or antifungal powder)  - Zinc oxide barrier cream     Urostomy In-patient supplies:      Pouch System:  Inpatient supply list: take home quantity listed         Pouch System:  [employer] 2-Piece CTF Flat Wafer Blue (mfg [id number]) - lawson number [phone]) order 5  [employer] 2-Piece High Output Pouch with Spout Blue (mfg [id number])- lawson number [phone]) order 5        Accessories:   Copywriter, advertising (mfg [id number]) - lawson number [phone]) order 5   [employer] Brava Elastic Strip (mfg [id number]) - lawson number [phone]) order 10   Esenta ConvaTec Adhesive Remover Wipes (mfg [id number]) - lawson number [phone]) order 1 box   [employer] Stoma Powder (mfg [id number]) - lawson number [phone]) order 1   26M No-Sting Barrier Film- Pads (mfg [id number]) - lawson number ([id number]) order 1 box           Recommended Consults:  - Not Applicable    WOCN Follow Up:  - We will sign off at this time    Plan of Care Discussed With:   - Patient  - Family  - RN primary    Supplies Ordered: No    Workup Time:   45 minutes    Carren Rang BSN, RN, [employer]  Wound Ostomy Consult Service   Vocera or [employer] when available

## 2024-03-26 NOTE — Unmapped (Signed)
 [facility]U PROGRESS NOTE     Date of Service: 03/26/2024    Hospital Day: LOS: 28 days        Surgery Date: TBD   Surgical Attending: Lawrence Marseilles Day Thurnell Lose, MD    Critical Care Attending: Tad Moore, MD    Interval History:   HDS.Lasix 40 BID yesterday. Positive in the evening. Lasix once overnight without positive response.    Lasix dose increased to 60 once this morning. May not be able to get volume status net negative due to high TF volume. Will trend daily weights. One BM overnight, imodium discontinued. Progressing on Nasal canula. Transfer to [place]. Will follow up with case management for disposition.     History of Present Illness:   Tammy Braun is a 83 y.o. female with PMH bladder cancer with cystectomy and ileal conduit, COPD, recent dx of afib, moderate-severe TR, CAD (high calcification score), T2DM presented to [facility] with projectile vomiting and abdominal pain, found to have SBO with transition point at level of ileostomy on CT A/P. On initial presentation, labs remarkable for leukocytosis thought to be iso hemoconcentration, AKI on CKD, mildly elevated venous lactate improved with resuscitation. Admitted to [facility] for worsening hypoxia on HFNC requiring intubation in setting of hiatal hernia. OR on 02/28/24 for ex-lap, reduciton of parastomal hernia, small bowel resection with primary anastomosis and lysis of adhesions.    Hospital Course:  - 02/26/24: Admitted to [facility], floor status, consult to urology for possible ileal conduit revision.   - 02/27/24: Patient level of care escalated to step-down iso increased oxygen needs while in ED.  - 02/28/24: Upgraded to ICU. Intubated at bed-side. OR for ex-lap, reduction of parastomal hernia, small bowel resection with primary anastomosis, lysis of adhesions, abdomen left open with ABThera in place.   - 03/01/24: OR for attempt at abdominal closure. Left open due to desaturations during closure attempt. ABThera in place.  - 03/03/24: RTOR for abdominal closure  -03/06/24: Vas cath placed, CRRT started   -03/09/24: Extubated to HFNC  -03/10/24: CRRT discontinued   - 03/11/24: Re-intubated for hypoxic respiratory failure 2/2 mucus plug   - 03/14/24 Extubated to HFNC   -03/15/24: Vas cath removed   -03/16/24: Placed on BiPap for complete collapse of L lung 2/2 probable mucus plugging  -03/19/24: Re-intubated for mucus plugging. Therapeutic bronch, BAL sent.   -03/22/24: Extubated to HFNC     ASSESSMENT & PLAN:     Neurologic:  - Pain: tylenol PRN, sch robaxin    -sleep: melatonin prn    #schizoaffective disorder  - Monthly prolixin injection (given 3/14)    Cardiovascular:  - MAP goal > 60; HDS without vasopressors     #HFpEF  #Pulm HTN  - ECHO 3/12- LVEF 70%, moderately dilated RV with moderate systolic dysfunction, severe pulmonary hypertension.    #Hx of HTN  - holding home amlodipine given recent hypotension, restart when appropriate     #Hx of HLD  - atorvastatin    #Afib/flutter w RVR:   - Prior hx of paroxysmal afib. CHADSvasc = 6, no AC PTA per chart review.      > Holding home diltiazem, question prior efficacy given likely poor absorption  - Amio PO daily   - heparin gtt for [facility]     #Pulmonary embolism  - 3/5 CTA chest: Filling defect in L anterior segmental artery  - heparin gtt     #Acute thrombus right basilic   - Identified 3/3 on BUE duplex  -  Supportive care, superficial     Respiratory:  #Acute on chronic hypoxemia  #Hx of COPD  - Aggressive pulmonary toilet  - Continue Brovana & atrovent nebs  - discontinue hypertonic saline nebs     #Aspiration  #Poor pulmonary clearance   - Re-intubated 3/21 for mucous plugging of L lung and inability to clear secretions. Extubated 3/23  - now on [place] @4L   - Wean supplemental oxygen as able, sat goal > 88%      FEN/GI:  F: ML  E: Replete electrolytes PRN. Chem-10 BID.  N: NPO, TF via Corpak. Will readdress long term feeding place with family.   - Speech therapy consult 3/27: NPO, ice chips PRN, recommend MBSS  - GI prophylaxis: not indicated - Bowel regimen: held    - BMx1   - Imodium discontinued   - Switch Vitamin D drops to q48hr from daily     #Hypernatremia- resolved  - FWF to 75mL q1h     #S/p ex-lap, reduction of parastomal hernia, small bowel resection with primary anastomosis, lysis of adhesions  - WTD dressing changes daily per nursing to midline incision    #Large hiatal hernia, chronic  - re-eval on CT chest/abd/pelvis 3/13 stable    Renal/Genitourinary:  #AKI on CKD  - Nephrology signed off, HD cath removed  -Lasix 60mg  once  -Daily weights  -net even      Intake/Output Summary (Last 24 hours) at 03/26/2024 0602  Last data filed at 03/26/2024 0400  Gross per 24 hour   Intake 2389.36 ml   Output 1375 ml   Net 1014.36 ml        #H/o bladder cancer s/p radical cystectomy with ileal conduit (2012):  #Parastomal hernia:  - Strict I&Os  - Ok for no foley in stoma at this time per urology    Endocrine:   #hyperglycemia   - Glycemic Control: SSI, continue NPH 15 unit BID    Hematologic:   - Monitor CBC daily, transfuse for hgb 7mg /dL  - DVT ppx: heparin gtt for PE    Recent Labs     03/24/24  0359 03/25/24  0622   HGB 7.4* 8.5*        Immunologic/Infectious Disease:  - Afebrile with resolved leukocytosis     - Cultures:   - Blood cultures (3/4): negative, final   - BAL (3/4): Pseudomonas aeruginosa - susceptible to meropenum    - BAL (3/8): Pseudomonas aeruginosa and candida glabrata  -3/12: Susceptibilities show resistance to Meropenum, Cefepime, Ceftaz and Zosyn.   - Bcx (3/11): Negative, final  - BAL (3/12): 600,000+ psuedomonas (susceptibilities: resistant to meropenum)  - Ucx (3/13): no growth  - BAL (3/20): <10,000 CFU  - ABD wound cx (3/21): 1+ candida glabrata (3/25) +1 Corynebacterium   - C. Diff (3/26): negative     - Antimicrobials:  - No antibiotics at this time  - Ceftriaxone (2/27- 2/28) for colonized UTI   - Zosyn (3/1 - 3/5) for abdominal contamination  - Zosyn (3/6) for pseudomonas when sensitivities remained pending  - Zerbaxa (3/12-3/21) for pseudomonas pna     Musculoskeletal:   #Deconditioned   - PT/OT consulted     Daily Care Checklist:   - Stress Ulcer Prevention: No  - DVT Prophylaxis: Chemical: Heparin drip  - Daily Awakening: N/A  - Spontaneous Breathing Trial: N/A  - Indication for Central/PICC Line: No  - Indication for Urinary Catheter: No   - Diagnostic images/reports of past 24hrs reviewed: Yes  Disposition:   - Transfer to step-down  - PT/OT consulted:yes    Tammy Braun is critically ill due to acute respiratory failure, hypoxemia, fluid overload, COPD, SBO, prolonged NPO, malnutrition, ileostomy.  This critical care time includes examining the patient, evaluating the hemodynamic, laboratory, and radiographic data, independently developing a comprehensive management plan, and serially assessing the patient's response to these critical care interventions. This critical care time excludes procedures.     Critical care time: 40 minutes     Armandina Gemma, ACNP   Surgical Intensive Care Unit  [facility] [place] [facility]       SUBJECTIVE:      Resting, no complaints. On nasal cannula      OBJECTIVE:     Physical Exam:  Constitutional: Laying in bed, chronically ill appearing, no acute distress   Neurologic: AAOX2 self/place,follows commands, no focal deficits appreciated. Globally weak and deconditioned   Respiratory: on nasal cannula, no respiratory distress or increase work of breathing   Cardiovascular:  Irregularly irregular rhythm, 2+ edema in bilateral LE   Gastrointestinal: Soft abdomen, midline incision staples in tact, WTD packing in place. Urostomy draining clear yellow urine.   Musculoskeletal: Trace BLE/BUE edema.  Skin: Warm and dry     Temp:  [36.4 ??C (97.6 ??F)-36.7 ??C (98 ??F)] 36.5 ??C (97.7 ??F)  Heart Rate:  [106-120] 106  SpO2 Pulse:  [107-122] 107  Resp:  [20-36] 26  BP: (91-137)/(54-90) 107/69  MAP (mmHg):  [64-106] 79  FiO2 (%):  [36 %] 36 %  SpO2:  [91 %-100 %] 95 %     Recent Laboratory Results:  No results for input(s): SPECTYPEART, PHART, PCO2ART, PO2ART, HCO3ART, BEART, O2SATART in the last 24 hours.    Recent Labs     Units 03/24/24  1542 03/25/24  0622 03/25/24  1646   NA mmol/L 146* 146* 145   K mmol/L 3.7 3.8 4.0   CL mmol/L 112* 110* 109*   CO2 mmol/L 27.0 24.0 25.0   BUN mg/dL 60* 54* 59*   CREATININE mg/dL 1.61* 0.96* 0.45*   GLU mg/dL 409* 811 914*     Lab Results   Component Value Date    BILITOT 0.3 03/22/2024    BILITOT 0.3 03/15/2024    BILIDIR 0.10 03/22/2024    BILIDIR 0.20 03/15/2024    ALT <7 (L) 03/22/2024    ALT 10 03/15/2024    AST 9 03/22/2024    AST 13 03/15/2024    GGT 16 09/17/2011    GGT 354 (H) 05/03/2011    ALKPHOS 70 03/22/2024    ALKPHOS 60 03/15/2024    PROT 6.5 03/22/2024    PROT 5.7 03/15/2024    ALBUMIN 2.0 (L) 03/22/2024    ALBUMIN 1.9 (L) 03/15/2024     Recent Labs     Units 03/25/24  0649 03/25/24  1147 03/25/24  1740 03/26/24  0109   POCGLU mg/dL 782* 956 213* 086     Recent Labs     Units 03/23/24  0528 03/24/24  0359 03/25/24  0622   WBC 10*9/L 10.0 7.7 8.1   RBC 10*12/L 2.66* 2.61* 3.04*   HGB g/dL 7.9* 7.4* 8.5*   HCT % 24.3* 23.0* 27.8*   MCV fL 91.3 88.2 91.7   MCH pg 29.5 28.2 28.0   MCHC g/dL 57.8 46.9 62.9*   RDW % 18.7* 19.0* 19.8*   PLT 10*9/L 254 253 245   MPV fL 9.8 9.9 10.1  Recent Labs     Units 03/25/24  0622   APTT sec 53.0*         Lines & Tubes:   Patient Lines/Drains/Airways Status       Active Peripheral & Central Intravenous Access       Name Placement date Placement time Site Days    Peripheral IV 03/22/24 Left;Posterior Hand 03/22/24  1556  Hand  3    Peripheral IV 03/23/24 Posterior;Right Hand 03/23/24  1755  Hand  2                     Patient Lines/Drains/Airways Status       Active Wounds       Name Placement date Placement time Site Days    Surgical Site 04/08/18 Shoulder Left 04/08/18  1338  -- 2178    Surgical Site 06/05/22 Eye Left 06/05/22  0928  -- 659    Surgical Site 06/19/22 Eye Right 06/19/22  1024  -- 645 Surgical Site 03/03/24 Abdomen 03/03/24  1013  -- 22    Wound 06/24/20 Other (comment) Buttocks inner right buttock 06/24/20  1010  Buttocks  1370    Wound 03/13/24 Neck Right open area note arount the HD catheter durng the dressing change 03/13/24  0930  Neck  12                     Respiratory/ventilator settings for last 24 hours:   FiO2 (%): 36 %    Intake/Output last 3 shifts:  I/O last 3 completed shifts:  In: 4491.7 [I.V.:421.7; NG/GT:4070]  Out: 1875 [Urine:1875]    Daily/Recent Weight:  65.2 kg (143 lb 11.8 oz)    BMI:  Body mass index is 27.17 kg/m??.    Medical History:  Past Medical History:   Diagnosis Date    Anxiety     Arthritis     At risk for falls     Breast cyst     Cancer     bladder    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    Chronic kidney disease     Depression, psychotic     Diabetes mellitus     in past    Emphysema of lung     Financial difficulties     Frail elderly     Hearing impairment     Hernia     History of transfusion     Hyperlipidemia     Hypertension     Impaired mobility     Osteoporosis     Pulmonary emphysema 05/08/2015    Visual impairment      Past Surgical History:   Procedure Laterality Date    ABDOMINAL SURGERY      BLADDER SURGERY      BREAST CYST EXCISION      CHEMOTHERAPY  2012    bladder    GALLBLADDER SURGERY      stone removal    ILEOSTOMY  2012    PR COLONOSCOPY FLX DX W/COLLJ SPEC WHEN PFRMD N/A 02/09/2015    Procedure: COLONOSCOPY, FLEXIBLE, PROXIMAL TO SPLENIC FLEXURE; DIAGNOSTIC, W/WO COLLECTION SPECIMEN BY BRUSH OR WASH;  Surgeon: Dewaine Conger, MD;  Location: [facility] [place];  Service: Gastroenterology    PR EXPLORATORY OF ABDOMEN N/A 02/28/2024    Procedure: EXPLORATORY LAPAROTOMY, EXPLORATORY CELIOTOMY WITH OR WITHOUT BIOPSY(S);  Surgeon: Renda Rolls, MD;  [facility]: [facility];  Service: General Surgery    PR  RECONSTR TOTAL SHOULDER IMPLANT Left 04/08/2018    Procedure: ARTHROPLASTY, GLENOHUMERAL JOINT; TOTAL SHOULDER(GLENOID & PROXIMAL HUMERAL REPLACEMENT(EG, TOTAL SHOULDER);  Surgeon: Tomasa Rand, MD;  Location: [facility] OR [facility];  Service: Ortho Sports Medicine    PR REOPEN RECENT ABD EXPLORATORY Midline 03/01/2024    Procedure: REOPENING OF RECENT LAPAROTOMY;  Surgeon: [name]., MD;  Location: OR [facility];  Service: Trauma    PR REOPEN RECENT ABD EXPLORATORY N/A 03/03/2024    Procedure: REOPENING OF RECENT LAPAROTOMY;  Surgeon: Joanie Coddington, MD;  Location: OR [facility];  Service: Trauma    PR SIGMOIDOSCOPY,BIOPSY N/A 03/11/2015    Procedure: SIGMOIDOSCOPY, FLEXIBLE; WITH BIOPSY, SINGLE OR MULTIPLE;  Surgeon: Wilburt Finlay, MD;  [facility]: [facility];  Service: Gastroenterology    PR XCAPSL CTRC RMVL INSJ IO LENS PROSTH W/O ECP Left 06/05/2022    Procedure: EXTRACAPSULAR CATARACT REMOVAL W/INSERTION OF INTRAOCULAR LENS PROSTHESIS, MANUAL OR MECHANICAL TECHNIQUE WITHOUT ENDOSCOPIC CYCLOPHOTOCOAGULATION;  Surgeon: Garner Gavel, MD;  Location: [facility] OR [facility];  Service: Ophthalmology    PR XCAPSL CTRC RMVL INSJ IO LENS PROSTH W/O ECP Right 06/19/2022    Procedure: EXTRACAPSULAR CATARACT REMOVAL W/INSERTION OF INTRAOCULAR LENS PROSTHESIS, MANUAL OR MECHANICAL TECHNIQUE WITHOUT ENDOSCOPIC CYCLOPHOTOCOAGULATION;  Surgeon: Garner Gavel, MD;  Location: [facility] OR [facility];  Service: Ophthalmology     Scheduled Medications:   amiodarone  200 mg Enteral tube: gastric Daily    arformoterol  15 mcg Nebulization BID (RT)    aspirin  81 mg Enteral tube: gastric Daily    atorvastatin  40 mg Enteral tube: gastric Daily    carboxymethylcellulose sodium  2 drop Both Eyes TID    [START ON 03/27/2024] cholecalciferol  50 mcg Enteral tube: gastric Q48H    flu vacc [id number](30yr up)-PF  0.5 mL Intramuscular During hospitalization    fluPHENAZine decanoate  50 mg Intramuscular Q30 Days    insulin NPH  15 Units Subcutaneous BID AC    insulin regular  0-20 Units Subcutaneous Q6H SCH    ipratropium  500 mcg Nebulization Q6H (RT)    loperamide  2 mg Enteral tube: gastric BID    methocarbamol  500 mg Enteral tube: gastric Q8H SCH    sodium chloride  4 mL Nebulization Q6H (RT)     Continuous Infusions:   heparin 16 Units/kg/hr (03/26/24 0400)     PRN Medications:  acetaminophen, albuterol, calcium gluconate, calcium gluconate, dextrose in water, heparin (porcine), heparin (porcine), magnesium sulfate in water, melatonin, ondansetron, potassium chloride in water

## 2024-03-26 NOTE — Unmapped (Signed)
 [facility] TRAUMA, ACUTE CARE, and GENERAL SURGERY   - Surgery Daily Progress Note -  03/27/2024     Admit Date: 02/26/2024, Hospital Day: 31  Hospital Service: [facility])  Attending: Aris Georgia, MD  [facility] - SRH-4  General / Trauma Surgery - Trauma/CC     Assessment     Tammy Braun 83 y.o. female, PMH bladder cancer with cystectomy and ileal conduit, COPD, recent dx of afib, moderate-severe TR, CAD (high calcification score), T2DM presented to [facility] with projectile vomiting and abdominal pain, found to have SBO with transition point at level of ileostomy on CT A/P. OR on 02/28/24 for ex-lap, reduction of parastomal hernia, small bowel resection with primary anastomosis and lysis of adhesions.     Interval Events:  Transferred out of the ICU to stepdown       Plan     Neuro:   - Acute pain:Well controlled  - Tylenol PRN, scheduled robaxin     *Insomnia  - Melatonin PRN    *Schizoaffective disorder  - Continue Prolixin monthly (last dose 3/14)      CV:   *Hx HTN   - HDS  - Holding home amlodipine given recent hypotension     *HFpEF, pulmonary HTN   - ECHO 3/12- EF 70%, severe pulmonary HTN     *Hx of HLD  - Continue lipitor 40mg  daily    *Afib-flutter w/ RVR:  *Hx of paroxysmal Afib  - Takes diltiazem outpatient; previously held due to likely poor absorption.  - Consulted cardiology to day 3/28 for amiodarone plan; recs pending  - Continue amiodarone PO daily  - Heparin gtt for [facility] - cardiology recs pending for transition to PO AC      Resp:   *Acute on chronic hypoxemia, hx COPD  *Pulmonary edema  *Pulmonary HTN   - Stable on 3L, Wean supplemental oxygen as able, sat goal > 88%   - Continue pulmonary toliet: OOB and IS   - Continue atrovent q6h, brovana BID   - CXR today pending  - Diuresis as needed - gave lasix 60 x1 today with a goal of at least -500    *Pulmonary embolism:   - 3/4 CTA chest: filling defect in L anterior segmental artery    - Heparin gtt - cardiology recs pending for transition to PO AC    Fen/GI:   - Diet: NPO  - Vital AF via corpak @ 52ml/hr w/ FWF 100 mL q2h   - MBSS 3/28: Ok for regular diet with food cut up into small pieces    - Severe Protein-Calorie Malnutrition in the context of acute illness or injury (03/10/24 0901)  Fat Loss: Moderate  Muscle Loss: Severe  Fluid Accumulation: Moderate  Malnutrition Score: 3  - Fluids: ML   - Zofran as needed for nausea   - Replete lytes prn: None   - Bowel regimen: Started Miralax     #S/p ex-lap, reduction of parastomal hernia, small bowel resection with primary anastomosis, lysis of adhesions   - WTD dressing changes daily per nursing to midline incision    #Large hiatal hernia, chronic  - re-eval on CT chest/abd/pelvis 3/13 stable    GU:   - Adequate UOP.   - Cr 1.01  - Voiding spontaneously--> urostomy     *AKI on CKD (improved)  - Cr improved  - Received CRRT x2  - Nephrology signed off  - Diuresis as needed    *H/o bladder cancer s/p radical  cystectomy with ileal conduit (2012):  *Parastomal hernia:  - Strict I&Os  - Ok for no foley in stoma at this time per urology  - Adequate UOP from stoma    Heme  - Hb stable 8.0    ID:   - Afebrile.  - WBC- No leukocytosis.  - C-Diff negative 3/26    Endocrine:   *Hyperglycemia   - NPH 15 units BID   - Regular SSI q6hr   - POCT    PPx:   Heparin gtt    Dispo: Stepdown status  PT: 5XL  OT: 5XL  ST: 5X  Barriers to discharge:  long term feeding plan, O2 requirement, cardiology recs, dispo  CM/SW assisting in discharge planning.     Contact: [facility] Stepdown [phone]  Mervyn Gay, PA-C      Subjective     No acute events overnight. Pain Controlled. No fever or chills. No BM x2 days but denies abd pain and passing flatus.      Objective     Vitals:   Temp:  [36.4 ??C (97.5 ??F)-36.9 ??C (98.5 ??F)] 36.4 ??C (97.5 ??F)  Heart Rate:  [86-117] 107  SpO2 Pulse:  [90-122] 109  Resp:  [17-32] 23  BP: (90-133)/(56-83) 133/73  MAP (mmHg):  [70-96] 96  SpO2:  [90 %-100 %] 99 %    Intake/Output last 24 hours:  I/O last 3 completed shifts:  In: 3410.6 [I.V.:275.6; NG/GT:3085; IV Piggyback:50]  Out: 2350 [Urine:2350]    Physical Exam:    -General:  Appropriate, comfortable and in no apparent distress.   -Neurological: Alert and oriented x3. Moves all 4 extremities spontaneously.   -Psych: speech appropriate, pleasant affect  -Cardiovascular: Regular rate and rhythm.  -Pulmonary: Normal work of breathing. No accessory muscle use.  -Abdomen: Soft, non-distended. Appropriately tender to palpation post- operatively. No rebound or guarding. Midline incision with island dressing place. 2 small openings packed.  -Genitourinary: Voiding spontaneously.  Ileal conduit urostomy pink and moist with good UOP.  -Extremities: Warm, well perfused, normal skin turgor. No LE edema.      Data Review:    I have reviewed the labs and studies from the last 24 hours.    Imaging: Radiology studies were personally reviewed

## 2024-03-27 LAB — PHOSPHORUS: PHOSPHORUS: 2.5 mg/dL (ref 2.4–5.1)

## 2024-03-27 LAB — CBC
HEMATOCRIT: 25.6 % — ABNORMAL LOW (ref 34.0–44.0)
HEMATOCRIT: 23.4 % — ABNORMAL LOW (ref 34.0–44.0)
HEMOGLOBIN: 8 g/dL — ABNORMAL LOW (ref 11.3–14.9)
HEMOGLOBIN: 7.4 g/dL — ABNORMAL LOW (ref 11.3–14.9)
MEAN CORPUSCULAR HEMOGLOBIN CONC: 31.3 g/dL — ABNORMAL LOW (ref 32.0–36.0)
MEAN CORPUSCULAR HEMOGLOBIN CONC: 31.7 g/dL — ABNORMAL LOW (ref 32.0–36.0)
MEAN CORPUSCULAR HEMOGLOBIN: 28.3 pg (ref 25.9–32.4)
MEAN CORPUSCULAR HEMOGLOBIN: 28.3 pg (ref 25.9–32.4)
MEAN CORPUSCULAR VOLUME: 90.3 fL (ref 77.6–95.7)
MEAN CORPUSCULAR VOLUME: 89.4 fL (ref 77.6–95.7)
MEAN PLATELET VOLUME: 10 fL (ref 6.8–10.7)
MEAN PLATELET VOLUME: 9.9 fL (ref 6.8–10.7)
PLATELET COUNT: 211 10*9/L (ref 150–450)
PLATELET COUNT: 215 10*9/L (ref 150–450)
RED BLOOD CELL COUNT: 2.83 10*12/L — ABNORMAL LOW (ref 3.95–5.13)
RED BLOOD CELL COUNT: 2.62 10*12/L — ABNORMAL LOW (ref 3.95–5.13)
RED CELL DISTRIBUTION WIDTH: 22.6 % — ABNORMAL HIGH (ref 12.2–15.2)
RED CELL DISTRIBUTION WIDTH: 22.4 % — ABNORMAL HIGH (ref 12.2–15.2)
WBC ADJUSTED: 8.4 10*9/L (ref 3.6–11.2)
WBC ADJUSTED: 8 10*9/L (ref 3.6–11.2)

## 2024-03-27 LAB — BASIC METABOLIC PANEL
ANION GAP: 7 mmol/L (ref 5–14)
BLOOD UREA NITROGEN: 61 mg/dL — ABNORMAL HIGH (ref 9–23)
BUN / CREAT RATIO: 60
CALCIUM: 9.2 mg/dL (ref 8.7–10.4)
CHLORIDE: 106 mmol/L (ref 98–107)
CO2: 27 mmol/L (ref 20.0–31.0)
CREATININE: 1.01 mg/dL (ref 0.55–1.02)
EGFR CKD-EPI (2021) FEMALE: 56 mL/min/1.73m2 — ABNORMAL LOW (ref >=60)
GLUCOSE RANDOM: 173 mg/dL (ref 70–179)
POTASSIUM: 4.5 mmol/L (ref 3.4–4.8)
SODIUM: 140 mmol/L (ref 135–145)

## 2024-03-27 LAB — APTT
APTT: 35.5 s (ref 24.8–38.4)
APTT: 42.7 s — ABNORMAL HIGH (ref 24.8–38.4)
HEPARIN CORRELATION: 0.2
HEPARIN CORRELATION: 0.2

## 2024-03-27 LAB — MAGNESIUM: MAGNESIUM: 2.2 mg/dL (ref 1.6–2.6)

## 2024-03-27 MED ADMIN — insulin regular (HumuLIN,NovoLIN) injection 0-20 Units: 0-20 [IU] | SUBCUTANEOUS | @ 11:00:00

## 2024-03-27 MED ADMIN — insulin NPH (HumuLIN,NovoLIN) injection 15 Units: 15 [IU] | SUBCUTANEOUS | @ 21:00:00

## 2024-03-27 MED ADMIN — heparin (porcine) 1000 unit/mL injection 2,000 Units: 2000 [IU] | INTRAVENOUS | @ 21:00:00

## 2024-03-27 MED ADMIN — furosemide (LASIX) injection 60 mg: 60 mg | INTRAVENOUS | @ 18:00:00 | Stop: 2024-03-27

## 2024-03-27 MED ADMIN — methocarbamol (ROBAXIN) tablet 500 mg: 500 mg | GASTROENTERAL | @ 02:00:00

## 2024-03-27 MED ADMIN — insulin regular (HumuLIN,NovoLIN) injection 0-20 Units: 0-20 [IU] | SUBCUTANEOUS | @ 16:00:00

## 2024-03-27 MED ADMIN — aspirin chewable tablet 81 mg: 81 mg | GASTROENTERAL | @ 13:00:00 | Stop: 2024-03-27

## 2024-03-27 MED ADMIN — insulin regular (HumuLIN,NovoLIN) injection 0-20 Units: 0-20 [IU] | SUBCUTANEOUS | @ 21:00:00

## 2024-03-27 MED ADMIN — methocarbamol (ROBAXIN) tablet 500 mg: 500 mg | GASTROENTERAL | @ 18:00:00

## 2024-03-27 MED ADMIN — arformoterol (BROVANA) nebulizer solution 15 mcg/2 mL: 15 ug | RESPIRATORY_TRACT | @ 01:00:00

## 2024-03-27 MED ADMIN — carboxymethylcellulose sodium (THERATEARS) 0.25 % ophthalmic solution 2 drop: 2 [drp] | OPHTHALMIC | @ 18:00:00

## 2024-03-27 MED ADMIN — barium sulfate 40 % (w/v), 30 % (w/w) paste: 5 g | ORAL | @ 15:00:00 | Stop: 2024-03-27

## 2024-03-27 MED ADMIN — melatonin tablet 3 mg: 3 mg | GASTROENTERAL | @ 02:00:00

## 2024-03-27 MED ADMIN — barium sulfate 60 % (w/v) oral suspension 10 mL: 10 mL | ORAL | @ 16:00:00 | Stop: 2024-03-27

## 2024-03-27 MED ADMIN — atorvastatin (LIPITOR) tablet 40 mg: 40 mg | GASTROENTERAL | @ 13:00:00

## 2024-03-27 MED ADMIN — ipratropium (ATROVENT) 0.02 % nebulizer solution 500 mcg: 500 ug | RESPIRATORY_TRACT | @ 14:00:00

## 2024-03-27 MED ADMIN — ipratropium (ATROVENT) 0.02 % nebulizer solution 500 mcg: 500 ug | RESPIRATORY_TRACT | @ 08:00:00

## 2024-03-27 MED ADMIN — ipratropium (ATROVENT) 0.02 % nebulizer solution 500 mcg: 500 ug | RESPIRATORY_TRACT | @ 21:00:00

## 2024-03-27 MED ADMIN — heparin (porcine) 1000 unit/mL injection 2,000 Units: 2000 [IU] | INTRAVENOUS | @ 11:00:00

## 2024-03-27 MED ADMIN — heparin 25,000 Units/250 mL (100 units/mL) in 0.45% saline infusion (premade): 0-24 [IU]/kg/h | INTRAVENOUS | @ 22:00:00

## 2024-03-27 MED ADMIN — arformoterol (BROVANA) nebulizer solution 15 mcg/2 mL: 15 ug | RESPIRATORY_TRACT | @ 14:00:00

## 2024-03-27 MED ADMIN — amiodarone (PACERONE) oral suspension: 200 mg | GASTROENTERAL | @ 13:00:00

## 2024-03-27 MED ADMIN — cholecalciferol (vitamin D3) 10 mcg/mL (400 unit/mL) oral solution: 50 ug | GASTROENTERAL | @ 13:00:00

## 2024-03-27 MED ADMIN — insulin NPH (HumuLIN,NovoLIN) injection 15 Units: 15 [IU] | SUBCUTANEOUS | @ 11:00:00

## 2024-03-27 MED ADMIN — methocarbamol (ROBAXIN) tablet 500 mg: 500 mg | GASTROENTERAL | @ 11:00:00

## 2024-03-27 MED ADMIN — ipratropium (ATROVENT) 0.02 % nebulizer solution 500 mcg: 500 ug | RESPIRATORY_TRACT | @ 01:00:00

## 2024-03-27 MED ADMIN — polyethylene glycol (MIRALAX) packet 17 g: 17 g | GASTROENTERAL | @ 18:00:00

## 2024-03-27 NOTE — Unmapped (Signed)
 DIVISION OF CARDIOLOGY  [facility] [employer] [place]        Date of Service: 03/27/24      CARDIOLOGY Treatment Plan Note    Requesting Physician: Aris Georgia, MD   Requesting Service: [facility])   Consulting Fellow: Josepha Pigg, MD     [name] is a 83 y.o. female with hx of bladder cancer with cystectomy and ileal conduit , COPD , recent dx of afib, moderate to severe TR , coronary artery calcifications who presented to [facility] with projectile vomiting and abdominal pain, found to have SBO with transition point at ileostomy. Cardiology consulted for afib with RVR.     Assessment/Plan:    Afib/flutter with RVR  Cardiology consulted this admission for Afib with RVR in the setting of a history of paroxysmal afib with episodes of RVR in the setting of prior hospitalizations with acute illness. Expect this presentation is similar to prior given acute illness with numerous stressors. She was started on amiodarone with appropriate rate control and remains asymptomatic at this time. Given ongoing Afib, recommend patient continues on amiodarone during this hospitalization and at the time of discharge. Once patient is close to discharge please reach out to the cardiology team so that we may arrange follow up to determine if ongoing treatment is necessary in the outpatient setting. Her CHADSVASC score is 6 and patient is currently on Heparin for a PE noted this admission. Thus anticoagulation is warranted.  -Continue PO amiodarone 200mg  through this admission and on discharge  -Please page cardiology closer to discharge and we will arrange follow up  -Convert to DOAC or similar agent as able given CHADSVASC score of 6 and PE

## 2024-03-27 NOTE — Unmapped (Signed)
 Speech Language Pathology Objective Swallow Study (MBS-FEES)  Evaluation (03/27/24 1045)          Patient Name:  Tammy Braun       Medical Record Number: [id number]   Date of Birth: February 07, 1941  Sex: Female            SLP Treatment Diagnosis:  Dysphagia- Oropharyngeal  rule out oropharyngeal dysphagia  Reason for Referral: Initial MBS    Assessment   Pt was awake/alert, cooperative; on RA; on tele; in NAD when seen this date in fluoro for modified barium swallowing study. Findings were notable for mild oropharyngeal dysphagia c/b reduced labial seal, bolus manipulation/anterior-posterior transit, and bite/mastication. Intermittent, silent, trace penetration was noted for consecutive sips of thin liquids, though, no aspiration was evident for any trial. Pt did demonstrate intermittent, productive-sounding cough, which was not associated with airway compromise. Of note, due to technical error, Pt's examination was not recorded, therefore, further data cannot be provided at this time. Pt is appropriate for return to baseline diet (regular solids/thin liquids with self-selection of softer solids per Pt preference). Recommend utilization of aspiration/reflux precautions and oral infection prevention protocol. Pt should be monitored closely for relevant change in respiratory status. ST to sign off at this time. Please re-consult with acute functional change in status.     Recommendations:  Recommendations: PO Diet, Frequent Oral Care    Diet Liquids Recommendations: Thin Liquids, Level 0, No Restrictions  Diet Solids Recommendation: Regular Consistency Solids  Recommended Form of Medications: Crushed, With liquid, Whole, With puree (As tolerated/Per RN discretion and/or Pt preference)     Recommended Compensatory Techniques : Slow rate, Small sips/bites, Upright 90 degrees, Upright 30 min after meal, Feed only when alert/awake, One to one assist with meals, Monitor signs of aspiration, Endurance may be limited: give small meals and snacks    Post Acute Discharge Recommendations  Post Acute SLP Discharge Recommendations: Skilled SLP services are NOT indicated    Prognosis: Fair  Positive Indicators: Cooperation; Motivation; Performance this date  Barriers to Discharge: Age, Inability to safely perform ADLS, Endurance deficits (PMH; Current medical status)    Plan of Care     SLP Daily Frequency: D/C Services D/C Services     Goals:  Short Term Goal 1: Patient to participate in ongoing clinical swallow evaluation to determine PO recommendations    Date Established : 03/24/24 Time Frame : 2 weeks    LTG 1: Patient to safely discharge to least restricive environment  Time Frame: 2 weeks    Patient and Family Goal: Pt's goal is to do well during this study.    Subjective  Activity Tolerance: Patient tolerated treatment well    Communication Preference: Verbal, Visual  Patient/Caregiver Reports: Pt eager to eat/drink and have NGT discontinued.   Pain: no s/s of pain  Medical Updates Since Last Visit/Relevant PMH Affecting Clinical Decision Making: Tammy Braun is a 83 y.o. female with PMH bladder cancer with cystectomy and ileal conduit, COPD, recent dx of afib, moderate-severe TR, CAD (high calcification score), T2DM presented to [facility] with projectile vomiting and abdominal pain, found to have SBO with transition point at level of ileostomy on CT A/P. On initial presentation, labs remarkable for leukocytosis thought to be iso hemoconcentration, AKI on CKD, mildly elevated venous lactate improved with resuscitation. Admitted to [facility] for worsening hypoxia on HFNC requiring intubation in setting of hiatal hernia. OR on 02/28/24 for ex-lap, reduciton of parastomal hernia, small bowel resection with primary anastomosis  and lysis of adhesions. Pt has been followed by ST service since 03/10/24 for clinical swallowing evaluation and intervention; NPO with further evaluation via MBSS recommended.    Lisinopril, Losartan, and Hctz [hydrochlorothiazide]   Current Facility-Administered Medications   Medication Dose Route Frequency Provider Last Rate Last Admin    acetaminophen (TYLENOL) tablet 1,000 mg  1,000 mg Enteral tube: gastric Q8H PRN Montez Hageman, MD   1,000 mg at 03/26/24 1937    amiodarone (PACERONE) oral suspension  200 mg Enteral tube: gastric Daily Montez Hageman, MD   200 mg at 03/27/24 0915    arformoterol (BROVANA) nebulizer solution 15 mcg/2 mL  15 mcg Nebulization BID (RT) Antony Haste, MD   15 mcg at 03/27/24 0931    atorvastatin (LIPITOR) tablet 40 mg  40 mg Enteral tube: gastric Daily Armandina Gemma, ACNP   40 mg at 03/27/24 0918    carboxymethylcellulose sodium (THERATEARS) 0.25 % ophthalmic solution 2 drop  2 drop Both Eyes TID Lala Lund, ACNP   2 drop at 03/27/24 1338    cholecalciferol (vitamin D3) 10 mcg/mL (400 unit/mL) oral solution  50 mcg Enteral tube: gastric Q48H Antony Haste, MD   50 mcg at 03/27/24 0917    dextrose (D10W) 10% bolus 125 mL  12.5 g Intravenous Q10 Min PRN Oliver Hum, MD   125 mL at 03/23/24 1856    flu vaccine [id number] (20YR UP)(PF)(FLUZONE HIGH DOSE)  0.5 mL Intramuscular During hospitalization Oliver Hum, MD        fluPHENAZine decanoate (PROLIXIN) injection 50 mg  50 mg Intramuscular Q30 Days Niehuus, Maceo Pro, MD   50 mg at 03/13/24 0235    heparin (porcine) 1000 unit/mL injection 1,200 Units  1,200 Units Intravenous Q1H PRN Lala Lund, ACNP   1,200 Units at 03/10/24 0120    heparin (porcine) 1000 unit/mL injection 2,000 Units  2,000 Units Intravenous Q6H PRN Armandina Gemma, ACNP   2,000 Units at 03/27/24 0651    heparin 25,000 Units/250 mL (100 units/mL) in 0.45% saline infusion (premade)  0-24 Units/kg/hr Intravenous Continuous Bhatt, Priyanka, ACNP 10.88 mL/hr at 03/27/24 0654 16 Units/kg/hr at 03/27/24 0654    insulin NPH (HumuLIN,NovoLIN) injection 15 Units  15 Units Subcutaneous BID Belva Bertin, MD   15 Units at 03/27/24 0656    insulin regular (HumuLIN,NovoLIN) injection 0-20 Units  0-20 Units Subcutaneous Q6H SCH Gracelyn Nurse L, NP   2 Units at 03/27/24 1200    ipratropium (ATROVENT) 0.02 % nebulizer solution 500 mcg  500 mcg Nebulization Q6H (RT) Alcide Clever Renee, PA   500 mcg at 03/27/24 0932    melatonin tablet 3 mg  3 mg Enteral tube: gastric Nightly PRN Tally Due, PA   3 mg at 03/26/24 2131    methocarbamol (ROBAXIN) tablet 500 mg  500 mg Enteral tube: gastric Q8H SCH Lala Lund, ACNP   500 mg at 03/27/24 1336    ondansetron (ZOFRAN) injection 4 mg  4 mg Intravenous Q6H PRN Oliver Hum, MD   4 mg at 03/11/24 1154    polyethylene glycol (MIRALAX) packet 17 g  17 g Enteral tube: gastric Daily Allean Found, PA   17 g at 03/27/24 1337     Facility-Administered Medications Ordered in Other Encounters   Medication Dose Route Frequency Provider Last Rate Last Admin    EPINEPHrine 100 mcg/10 mL (10 mcg/mL) injection   Intravenous PRN (once a day) Ichite, Precious C, MD  10 mcg at 02/28/24 1511     Patient Active Problem List    Diagnosis Date Noted    SBO (small bowel obstruction) 02/26/2024    Atrial fibrillation, unspecified type 02/25/2024    Antalgic gait 08/29/2023    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    Arthritis of neck 02/26/2023    History of bladder cancer 02/26/2023    Other artificial openings of urinary tract status 10/18/2022    Age-related osteoporosis without current pathological fracture 09/14/2022    Pyelitis 05/10/2022    Pulmonary HTN 04/11/2022    Primary hypertension 09/27/2021    Nonrheumatic tricuspid valve regurgitation 09/27/2021    Carotid arterial disease (CMS-HCC) < 40% bilaterally 2021 12/08/2020    Frequent PVCs 09/21/2020    Influenza vaccination declined 09/15/2020    Chronic tension-type headache, not intractable 09/15/2020    Osteoarthritis of cervical spine 09/15/2020    Arthralgia of both ankles 09/15/2020    Skin breakdown 06/30/2020 Bradycardia 06/27/2020    Tinnitus 01/13/2019    Left shoulder pain 02/20/2018    Articular cartilage disorder of shoulder region, left 02/20/2018    Disorder of left rotator cuff 02/13/2018    Spinal stenosis of cervical region 02/13/2018    Tinea versicolor 10/28/2017    Right hip pain 04/22/2017    At maximum risk for fall 03/06/2017    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 11/07/2016    Onychomycosis of multiple toenails with type 2 diabetes mellitus and peripheral neuropathy 08/09/2016    COPD (chronic obstructive pulmonary disease) with emphysema 05/08/2015    Schizoaffective disorder 05/08/2015    Hypercholesterolemia refuses meds 04/28/2015    Peripheral neuropathy 07/07/2014    Chronic renal disease, stage 2, mildly decreased glomerular filtration rate (GFR) between 60-89 mL/min/1.73 square meter 06/16/2014    Hip arthritis mild 05/12/2014    Hyponatremia 08/03/2013    Hernia, diaphragmatic 07/09/2013    Senile nuclear sclerosis 05/23/2012    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    DM (diabetes mellitus), type 2 with neurological complications 07/18/2011    HTN (hypertension) 07/18/2011    Tobacco use disorder- Repeated qiuit recs and attempts 07/18/2011     Past Medical History:   Diagnosis Date    Anxiety     Arthritis     At risk for falls     Breast cyst     Cancer     bladder    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    Chronic kidney disease     Depression, psychotic     Diabetes mellitus     in past    Emphysema of lung     Financial difficulties     Frail elderly     Hearing impairment     Hernia     History of transfusion     Hyperlipidemia     Hypertension     Impaired mobility     Osteoporosis     Pulmonary emphysema 05/08/2015    Visual impairment        Past Surgical History:   Procedure Laterality Date    ABDOMINAL SURGERY      BLADDER SURGERY      BREAST CYST EXCISION      CHEMOTHERAPY  2012    bladder    GALLBLADDER SURGERY      stone removal    ILEOSTOMY  2012    PR COLONOSCOPY FLX DX W/COLLJ SPEC WHEN PFRMD N/A 02/09/2015    Procedure: COLONOSCOPY, FLEXIBLE, PROXIMAL TO  SPLENIC FLEXURE; DIAGNOSTIC, W/WO COLLECTION SPECIMEN BY BRUSH OR WASH;  Surgeon: Dewaine Conger, MD;  Location: [facility];  Service: Gastroenterology    PR EXPLORATORY OF ABDOMEN N/A 02/28/2024    Procedure: EXPLORATORY LAPAROTOMY, EXPLORATORY CELIOTOMY WITH OR WITHOUT BIOPSY(S);  Surgeon: Renda Rolls, MD;  Location: [facility] OR [facility];  Service: General Surgery    PR RECONSTR TOTAL SHOULDER IMPLANT Left 04/08/2018    Procedure: ARTHROPLASTY, GLENOHUMERAL JOINT; TOTAL SHOULDER(GLENOID & PROXIMAL HUMERAL REPLACEMENT(EG, TOTAL SHOULDER);  Surgeon: Tomasa Rand, MD;  Location[facility];  Service: Ortho Sports Medicine    PR REOPEN RECENT ABD EXPLORATORY Midline 03/01/2024    Procedure: REOPENING OF RECENT LAPAROTOMY;  Surgeon: [name]., MD;  Location: OR [facility];  Service: Trauma    PR REOPEN RECENT ABD EXPLORATORY N/A 03/03/2024    Procedure: REOPENING OF RECENT LAPAROTOMY;  Surgeon: Joanie Coddington, MD;  Location: OR [facility];  Service: Trauma    PR SIGMOIDOSCOPY,BIOPSY N/A 03/11/2015    Procedure: Arnell Sieving; WITH BIOPSY, SINGLE OR MULTIPLE;  Surgeon: Wilburt Finlay, MD;  [facility];  Service: Gastroenterology    PR XCAPSL CTRC RMVL INSJ IO LENS PROSTH W/O ECP Left 06/05/2022    Procedure: EXTRACAPSULAR CATARACT REMOVAL W/INSERTION OF INTRAOCULAR LENS PROSTHESIS, MANUAL OR MECHANICAL TECHNIQUE WITHOUT ENDOSCOPIC CYCLOPHOTOCOAGULATION;  Surgeon: Garner Gavel, MD;  Location: [facility] OR [facility];  Service: Ophthalmology    PR XCAPSL CTRC RMVL INSJ IO LENS PROSTH W/O ECP Right 06/19/2022    Procedure: EXTRACAPSULAR CATARACT REMOVAL W/INSERTION OF INTRAOCULAR LENS PROSTHESIS, MANUAL OR MECHANICAL TECHNIQUE WITHOUT ENDOSCOPIC CYCLOPHOTOCOAGULATION;  Surgeon: Garner Gavel, MD;  Location: [facility] OR [facility]; Service: Ophthalmology     Social History     Tobacco Use    Smoking status: Every Day     Current packs/day: 1.00     Average packs/day: 1 pack/day for 66.0 years (66.0 ttl pk-yrs)     Types: Cigarettes     Start date: 04/09/1958    Smokeless tobacco: Never    Tobacco comments:     15cpd   Substance Use Topics    Alcohol use: No     Alcohol/week: 0.0 standard drinks of alcohol     Family History   Problem Relation Age of Onset    Breast cancer Daughter 1    Diabetes Mother     Glaucoma Father     Colon cancer Neg Hx     Ovarian cancer Neg Hx     Endometrial cancer Neg Hx     Anesthesia problems Neg Hx     Bleeding Disorder Neg Hx      Medical Tests / Procedures Comments: CXR 3/25: Small bilateral pleural effusion with adjacent atelectasis.  Large hiatal hernia.  Equipment/Environment: NGT, Telemetry, Supplemental oxygen  Precautions / Restrictions  Precautions: Aspiration precautions, Isolation precautions  Weight Bearing Status: Non-applicable  Required Braces or Orthoses: Non-applicable    Prior Functional Status:   Dysphagia History: None known PTA    Diet Prior to this Study: NPO+NGT     Recent Procedures/Tests/Findings:  CXR 3/25: Small bilateral pleural effusion with adjacent atelectasis.  Large hiatal hernia.    Objective  Cognitive-Communication Status : Grossly intact for participation in today's evaluation   Oral Mechanism : Grossly intact with the exception of generalized weakness/debility and edentulism.    Respiratory Status : O2 via nasal cannula    Secretions : None noted  Positioning : Upright in bed    Bolus Presentation : Multiple Sips, Straw Sip, Spoon, Fed by therapist/other staff      Oral Consistency 1  Stage of Consistency: Thin Liquids, Level 0     Oral Consistency 2  Oral Consistency: Puree- Extremely Thick Level 4     Oral Consistency 3  Oral Consistency: Regular Solids      Activity Tolerance: Patient tolerated treatment well    Patient at end of session: All needs in reach, Friends/Family present, In bed, Lines intact, Notified Provider, Staff present    Speech Therapy Session Duration  SLP Individual [mins]: 30    I attest that I have reviewed the above information.  Signed: Valeta Harms, SLP  Ceasar Mons 03/27/2024

## 2024-03-27 NOTE — Unmapped (Signed)
 ICU TRANSPORT NOTE    Destination: [place]    Departing Unit: Dorothey Baseman Time: 1015    Return Unit: [facility]  Return Time: 28    [place] patient ID band verified  Allergies Reviewed  Code Status at time of transport: Full    Report received from primary nurse via [employer]AR[employer]. Handoff performed of continuous drip/infusion Patient transported via stretcher under stepdown level of care. See vital signs during transport via [employer]. O2 via [place] @ 3 L. Patient is alert and following commands. Patient tolerated procedure/scan well. Universal, Contact, and Aspiration precautions maintained throughout transport.    Update and care given to primary nurse. See Doc Flowsheets/MAR for additional transportation documentation. Proper body mechanics and safe patient handling equipment were utilized throughout transport.

## 2024-03-27 NOTE — Unmapped (Signed)
 Pt remains on [place] tolerating well. VSS and no distress noted. Txs given as ordered will continue to monitor closely.

## 2024-03-27 NOTE — Unmapped (Signed)
 Problem: Adult Inpatient Plan of Care  Goal: Plan of Care Review  Outcome: Ongoing - Unchanged  Goal: Patient-Specific Goal (Individualized)  Outcome: Ongoing - Unchanged  Goal: Absence of Hospital-Acquired Illness or Injury  Outcome: Ongoing - Unchanged  Intervention: Prevent Skin Injury  Recent Flowsheet Documentation  Taken 03/26/2024 2000 by Erling Cruz, RN  Positioning for Skin: Left  Skin Protection: adhesive use limited  Goal: Optimal Comfort and Wellbeing  Outcome: Ongoing - Unchanged  Goal: Readiness for Transition of Care  Outcome: Ongoing - Unchanged  Goal: Rounds/Family Conference  Outcome: Ongoing - Unchanged     Problem: Infection  Goal: Absence of Infection Signs and Symptoms  Outcome: Ongoing - Unchanged     Problem: Fall Injury Risk  Goal: Absence of Fall and Fall-Related Injury  Outcome: Ongoing - Unchanged     Problem: Wound  Goal: Optimal Coping  Outcome: Ongoing - Unchanged  Goal: Optimal Functional Ability  Outcome: Ongoing - Unchanged  Goal: Absence of Infection Signs and Symptoms  Outcome: Ongoing - Unchanged  Goal: Improved Oral Intake  Outcome: Ongoing - Unchanged  Goal: Optimal Pain Control and Function  Outcome: Ongoing - Unchanged  Goal: Skin Health and Integrity  Outcome: Ongoing - Unchanged  Intervention: Optimize Skin Protection  Recent Flowsheet Documentation  Taken 03/26/2024 2000 by Erling Cruz, RN  Pressure Reduction Techniques: frequent weight shift encouraged  Pressure Reduction Devices: positioning supports utilized  Skin Protection: adhesive use limited  Goal: Optimal Wound Healing  Outcome: Ongoing - Unchanged     Problem: Skin Injury Risk Increased  Goal: Skin Health and Integrity  Outcome: Ongoing - Unchanged  Intervention: Optimize Skin Protection  Recent Flowsheet Documentation  Taken 03/26/2024 2000 by Erling Cruz, RN  Pressure Reduction Techniques: frequent weight shift encouraged  Pressure Reduction Devices: positioning supports utilized  Skin Protection: adhesive use limited     Problem: Self-Care Deficit  Goal: Improved Ability to Complete Activities of Daily Living  Outcome: Ongoing - Unchanged     Problem: Malnutrition  Goal: Improved Nutritional Intake  Outcome: Ongoing - Unchanged     Problem: Mechanical Ventilation Invasive  Goal: Absence of Device-Related Skin and Tissue Injury  Outcome: Ongoing - Unchanged

## 2024-03-28 LAB — APTT
APTT: 40.7 s — ABNORMAL HIGH (ref 24.8–38.4)
APTT: 28.6 s (ref 24.8–38.4)
HEPARIN CORRELATION: 0.2
HEPARIN CORRELATION: 0.2

## 2024-03-28 LAB — BASIC METABOLIC PANEL
ANION GAP: 9 mmol/L (ref 5–14)
ANION GAP: 9 mmol/L (ref 5–14)
BLOOD UREA NITROGEN: 63 mg/dL — ABNORMAL HIGH (ref 9–23)
BLOOD UREA NITROGEN: 67 mg/dL — ABNORMAL HIGH (ref 9–23)
BUN / CREAT RATIO: 62
BUN / CREAT RATIO: 63
CALCIUM: 8.9 mg/dL (ref 8.7–10.4)
CALCIUM: 8.9 mg/dL (ref 8.7–10.4)
CHLORIDE: 103 mmol/L (ref 98–107)
CHLORIDE: 100 mmol/L (ref 98–107)
CO2: 27 mmol/L (ref 20.0–31.0)
CO2: 26 mmol/L (ref 20.0–31.0)
CREATININE: 1.01 mg/dL (ref 0.55–1.02)
CREATININE: 1.07 mg/dL — ABNORMAL HIGH (ref 0.55–1.02)
EGFR CKD-EPI (2021) FEMALE: 56 mL/min/1.73m2 — ABNORMAL LOW (ref >=60)
EGFR CKD-EPI (2021) FEMALE: 52 mL/min/1.73m2 — ABNORMAL LOW (ref >=60)
GLUCOSE RANDOM: 131 mg/dL (ref 70–179)
GLUCOSE RANDOM: 175 mg/dL (ref 70–179)
POTASSIUM: 5 mmol/L — ABNORMAL HIGH (ref 3.4–4.8)
POTASSIUM: 5.1 mmol/L — ABNORMAL HIGH (ref 3.4–4.8)
SODIUM: 139 mmol/L (ref 135–145)
SODIUM: 135 mmol/L (ref 135–145)

## 2024-03-28 LAB — MAGNESIUM
MAGNESIUM: 1.9 mg/dL (ref 1.6–2.6)
MAGNESIUM: 1.9 mg/dL (ref 1.6–2.6)

## 2024-03-28 LAB — CBC
HEMATOCRIT: 25 % — ABNORMAL LOW (ref 34.0–44.0)
HEMOGLOBIN: 8 g/dL — ABNORMAL LOW (ref 11.3–14.9)
MEAN CORPUSCULAR HEMOGLOBIN CONC: 32.1 g/dL (ref 32.0–36.0)
MEAN CORPUSCULAR HEMOGLOBIN: 28.6 pg (ref 25.9–32.4)
MEAN CORPUSCULAR VOLUME: 89.1 fL (ref 77.6–95.7)
MEAN PLATELET VOLUME: 9.9 fL (ref 6.8–10.7)
PLATELET COUNT: 223 10*9/L (ref 150–450)
RED BLOOD CELL COUNT: 2.8 10*12/L — ABNORMAL LOW (ref 3.95–5.13)
RED CELL DISTRIBUTION WIDTH: 22.4 % — ABNORMAL HIGH (ref 12.2–15.2)
WBC ADJUSTED: 8.8 10*9/L (ref 3.6–11.2)

## 2024-03-28 LAB — PHOSPHORUS
PHOSPHORUS: 2.9 mg/dL (ref 2.4–5.1)
PHOSPHORUS: 2.8 mg/dL (ref 2.4–5.1)

## 2024-03-28 MED ADMIN — insulin regular (HumuLIN,NovoLIN) injection 0-20 Units: 0-20 [IU] | SUBCUTANEOUS | @ 10:00:00

## 2024-03-28 MED ADMIN — ipratropium (ATROVENT) 0.02 % nebulizer solution 500 mcg: 500 ug | RESPIRATORY_TRACT | @ 08:00:00

## 2024-03-28 MED ADMIN — senna (SENOKOT) tablet 1 tablet: 1 | ORAL | @ 02:00:00

## 2024-03-28 MED ADMIN — melatonin tablet 3 mg: 3 mg | GASTROENTERAL | @ 04:00:00 | Stop: 2024-03-28

## 2024-03-28 MED ADMIN — insulin NPH (HumuLIN,NovoLIN) injection 15 Units: 15 [IU] | SUBCUTANEOUS | @ 11:00:00

## 2024-03-28 MED ADMIN — insulin regular (HumuLIN,NovoLIN) injection 0-20 Units: 0-20 [IU] | SUBCUTANEOUS | @ 16:00:00

## 2024-03-28 MED ADMIN — heparin 25,000 Units/250 mL (100 units/mL) in 0.45% saline infusion (premade): 0-24 [IU]/kg/h | INTRAVENOUS

## 2024-03-28 MED ADMIN — insulin regular (HumuLIN,NovoLIN) injection 0-20 Units: 0-20 [IU] | SUBCUTANEOUS | @ 21:00:00

## 2024-03-28 MED ADMIN — carboxymethylcellulose sodium (THERATEARS) 0.25 % ophthalmic solution 2 drop: 2 [drp] | OPHTHALMIC | @ 17:00:00

## 2024-03-28 MED ADMIN — arformoterol (BROVANA) nebulizer solution 15 mcg/2 mL: 15 ug | RESPIRATORY_TRACT | @ 01:00:00

## 2024-03-28 MED ADMIN — ipratropium (ATROVENT) 0.02 % nebulizer solution 500 mcg: 500 ug | RESPIRATORY_TRACT | @ 01:00:00

## 2024-03-28 MED ADMIN — methocarbamol (ROBAXIN) tablet 500 mg: 500 mg | GASTROENTERAL | @ 02:00:00

## 2024-03-28 MED ADMIN — insulin NPH (HumuLIN,NovoLIN) injection 15 Units: 15 [IU] | SUBCUTANEOUS | @ 21:00:00

## 2024-03-28 MED ADMIN — melatonin tablet 3 mg: 3 mg | GASTROENTERAL | @ 02:00:00

## 2024-03-28 MED ADMIN — atorvastatin (LIPITOR) tablet 40 mg: 40 mg | GASTROENTERAL | @ 14:00:00

## 2024-03-28 MED ADMIN — albumin human 5 % 12.5 g: 12.5 g | INTRAVENOUS | @ 04:00:00 | Stop: 2024-03-28

## 2024-03-28 MED ADMIN — ipratropium (ATROVENT) 0.02 % nebulizer solution 500 mcg: 500 ug | RESPIRATORY_TRACT | @ 13:00:00

## 2024-03-28 MED ADMIN — amiodarone (PACERONE) oral suspension: 200 mg | GASTROENTERAL | @ 14:00:00

## 2024-03-28 MED ADMIN — ipratropium (ATROVENT) 0.02 % nebulizer solution 500 mcg: 500 ug | RESPIRATORY_TRACT | @ 21:00:00

## 2024-03-28 MED ADMIN — acetaminophen (TYLENOL) tablet 1,000 mg: 1000 mg | GASTROENTERAL | @ 02:00:00

## 2024-03-28 MED ADMIN — arformoterol (BROVANA) nebulizer solution 15 mcg/2 mL: 15 ug | RESPIRATORY_TRACT | @ 13:00:00

## 2024-03-28 MED ADMIN — heparin (porcine) 1000 unit/mL injection 2,000 Units: 2000 [IU] | INTRAVENOUS | @ 19:00:00

## 2024-03-28 MED ADMIN — docusate sodium (COLACE) capsule 100 mg: 100 mg | ORAL | @ 14:00:00 | Stop: 2024-03-28

## 2024-03-28 MED ADMIN — bisacodyl (DULCOLAX) suppository 10 mg: 10 mg | RECTAL | @ 14:00:00 | Stop: 2024-03-28

## 2024-03-28 MED ADMIN — carboxymethylcellulose sodium (THERATEARS) 0.25 % ophthalmic solution 2 drop: 2 [drp] | OPHTHALMIC | @ 14:00:00

## 2024-03-28 NOTE — Unmapped (Signed)
 [place] TRAUMA, ACUTE CARE, and GENERAL SURGERY   - Surgery Daily Progress Note -  03/28/2024     Admit Date: 02/26/2024, Hospital Day: 36  Hospital Service: [facility])  Attending: Aris Georgia, MD  [facility] - [facility]  General / Trauma Surgery - Trauma/CC     Assessment     Tammy Braun 83 y.o. female, PMH bladder cancer with cystectomy and ileal conduit, COPD, recent dx of afib, moderate-severe TR, CAD (high calcification score), T2DM presented to [facility] with projectile vomiting and abdominal pain, found to have SBO with transition point at level of ileostomy on CT A/P. OR on 02/28/24 for ex-lap, reduction of parastomal hernia, small bowel resection with primary anastomosis and lysis of adhesions.     Interval Events:  Hypotension overnight, responded to albumin and BP cuff adjustment. Also with mild bradycardia.       Plan     Neuro:   - Acute pain:Well controlled  - Tylenol PRN, stop robaxin now she is many weeks past surgery     *Insomnia  - Melatonin PRN    *Schizoaffective disorder  - Continue Prolixin monthly (last dose 3/14)      CV:   *Hx HTN   - HDS now, but with hypotension overnight that was responsive to albumin and BP cuff adjustment  - Holding home amlodipine given recent hypotension     *HFpEF, pulmonary HTN   - ECHO 3/12- EF 70%, severe pulmonary HTN     *Hx of HLD  - Continue lipitor 40mg  daily    *Afib-flutter w/ RVR:  *Hx of paroxysmal Afib  - Was taking diltiazem outpatient; previously held due to likely poor absorption and started on amiodarone in ICU.  - Consulted cardiology 3/28 for amiodarone plan; recommend continuining PO amiodarone 200 mg daily through admission and upon discharge (even with bradycardia, as they feel she is at her baseline rate s/p conversion). Recommend repeat EKG today (pending) and paging closer to DC for follow-up. Can convert to DOAC as able.         Resp:   *Acute on chronic hypoxemia, hx COPD  *Pulmonary edema  *Pulmonary HTN   - Stable on 3L, Wean supplemental oxygen as able, sat goal > 88%   - Continue pulmonary toliet: OOB and IS   - Continue atrovent q6h, brovana BID   - CXR today pending, s/p 60mg  IV lasix yesterday for goal of -500     *Pulmonary embolism:   - 3/4 CTA chest: filling defect in L anterior segmental artery    - Heparin gtt - will consider therapeutic lovenox tomorrow     Fen/GI:   - Diet: Regular diet with food cut up in small pieces. Cal count in place. Supplements ordered.   - Vital AF via corpak @ 8ml/hr w/ FWF 100 mL q2h, night cycled 1800-1000  - Severe Protein-Calorie Malnutrition in the context of acute illness or injury (03/10/24 0901)  Fat Loss: Moderate  Muscle Loss: Severe  Fluid Accumulation: Moderate  Malnutrition Score: 3  - Fluids: ML   - Zofran as needed for nausea   - Replete lytes prn: None needed today   - Bowel regimen: Miralax, senna. Suppository today    #S/p ex-lap, reduction of parastomal hernia, small bowel resection with primary anastomosis, lysis of adhesions   - WTD dressing changes daily per nursing to midline incision    #Large hiatal hernia, chronic  - re-eval on CT chest/abd/pelvis 3/13 stable    GU:   -  Adequate UOP.   - Cr WNL  - Voiding spontaneously--> urostomy     *AKI on CKD (resolved)  - Cr improved  - Received CRRT x2  - Nephrology signed off  - Diuresis as needed    *H/o bladder cancer s/p radical cystectomy with ileal conduit (2012):  *Parastomal hernia:  - Strict I&Os  - Ok for no foley in stoma at this time per urology  - Adequate UOP from stoma    Heme  - Hgb stable    ID:   - Afebrile.  - WBC- No leukocytosis.  - C-Diff negative 3/26    Endocrine:   *Hyperglycemia   - NPH 15 units BID   - Regular SSI q6hr   - POCT    PPx:   Heparin gtt    Dispo: Stepdown status  PT: 5XL  OT: 5XL  ST: 5X  Barriers to discharge:  long term feeding plan, O2 requirement, cardiology recs, dispo  CM/SW assisting in discharge planning.     Contact: [facility] Stepdown [phone]  [name], AGNP        Subjective     I want this thing out of my nose.       Objective     Vitals:   Temp:  [35.8 ??C (96.5 ??F)-36.9 ??C (98.4 ??F)] 36.5 ??C (97.7 ??F)  Heart Rate:  [53-110] 59  SpO2 Pulse:  [53-110] 57  Resp:  [16-32] 31  BP: (66-133)/(41-105) 132/86  MAP (mmHg):  [50-112] 102  SpO2:  [90 %-100 %] 90 %    Intake/Output last 24 hours:  I/O last 3 completed shifts:  In: 1889.3 [I.V.:399.3; NG/GT:1490]  Out: 2050 [Urine:2050]    Physical Exam:    -General:  Appropriate, comfortable and in no apparent distress.   -Neurological: Alert and oriented with appropriate conversation. Moves all 4 extremities spontaneously.   -Psych: speech appropriate, pleasant affect  -Cardiovascular: Regular rate and rhythm, SB  -Pulmonary: Normal work of breathing. No accessory muscle use.  -Abdomen: Soft, non-distended. Appropriately tender to palpation post- operatively. No rebound or guarding. Midline incision with island dressing place. 2 small openings packed.  -Genitourinary: Voiding spontaneously.  Ileal conduit urostomy pink and moist with good UOP.  -Extremities: Warm, well perfused, normal skin turgor.       Data Review:    I have reviewed the labs and studies from the last 24 hours.    Imaging: Radiology studies were personally reviewed

## 2024-03-28 NOTE — Unmapped (Signed)
 Problem: Adult Inpatient Plan of Care  Goal: Plan of Care Review  Outcome: Progressing  Goal: Patient-Specific Goal (Individualized)  Outcome: Progressing  Goal: Absence of Hospital-Acquired Illness or Injury  Outcome: Progressing  Intervention: Identify and Manage Fall Risk  Recent Flowsheet Documentation  Taken 03/27/2024 2000 by Kara Dies, RN  Safety Interventions:   aspiration precautions   bed alarm   environmental modification   fall reduction program maintained  Intervention: Prevent Skin Injury  Recent Flowsheet Documentation  Taken 03/28/2024 0600 by Kara Dies, RN  Positioning for Skin: Left  Taken 03/28/2024 0400 by Kara Dies, RN  Positioning for Skin: Right  Taken 03/28/2024 0200 by Kara Dies, RN  Positioning for Skin: Left  Taken 03/28/2024 0000 by Kara Dies, RN  Positioning for Skin: Right  Taken 03/27/2024 2200 by Kara Dies, RN  Positioning for Skin: Left  Taken 03/27/2024 2000 by Kara Dies, RN  Positioning for Skin: Right  Skin Protection: adhesive use limited  Intervention: Prevent and Manage VTE (Venous Thromboembolism) Risk  Recent Flowsheet Documentation  Taken 03/28/2024 0600 by Kara Dies, RN  Anti-Embolism Device Type: SCD, Knee  Anti-Embolism Device Status: On  Anti-Embolism Device Location: BLE  Taken 03/28/2024 0400 by Kara Dies, RN  Anti-Embolism Device Type: SCD, Knee  Anti-Embolism Device Status: On  Anti-Embolism Device Location: BLE  Taken 03/28/2024 0200 by Kara Dies, RN  Anti-Embolism Device Type: SCD, Knee  Anti-Embolism Device Status: On  Anti-Embolism Device Location: BLE  Taken 03/28/2024 0000 by Kara Dies, RN  Anti-Embolism Device Type: SCD, Knee  Anti-Embolism Device Status: On  Anti-Embolism Device Location: BLE  Taken 03/27/2024 2200 by Kara Dies, RN  Anti-Embolism Device Type: SCD, Knee  Anti-Embolism Device Status: On  Anti-Embolism Device Location: BLE  Taken 03/27/2024 2000 by Kara Dies, RN  Anti-Embolism Device Type: SCD, Knee  Anti-Embolism Device Location: BLE  Intervention: Prevent Infection  Recent Flowsheet Documentation  Taken 03/27/2024 2000 by Kara Dies, RN  Infection Prevention:   hand hygiene promoted   cohorting utilized   environmental surveillance performed  Goal: Optimal Comfort and Wellbeing  Outcome: Progressing  Goal: Readiness for Transition of Care  Outcome: Progressing  Goal: Rounds/Family Conference  Outcome: Progressing     Problem: Infection  Goal: Absence of Infection Signs and Symptoms  Outcome: Progressing  Intervention: Prevent or Manage Infection  Recent Flowsheet Documentation  Taken 03/27/2024 2000 by Kara Dies, RN  Infection Management: aseptic technique maintained     Problem: Fall Injury Risk  Goal: Absence of Fall and Fall-Related Injury  Outcome: Progressing  Intervention: Promote Injury-Free Environment  Recent Flowsheet Documentation  Taken 03/27/2024 2000 by Kara Dies, RN  Safety Interventions:   aspiration precautions   bed alarm   environmental modification   fall reduction program maintained     Problem: Wound  Goal: Optimal Coping  Outcome: Progressing  Goal: Optimal Functional Ability  Outcome: Progressing  Intervention: Optimize Functional Ability  Recent Flowsheet Documentation  Taken 03/27/2024 2000 by Kara Dies, RN  Activity Management: bedrest  Goal: Absence of Infection Signs and Symptoms  Outcome: Progressing  Intervention: Prevent or Manage Infection  Recent Flowsheet Documentation  Taken 03/27/2024 2000 by Kara Dies, RN  Infection Management: aseptic technique maintained  Goal: Improved Oral Intake  Outcome: Progressing  Goal: Optimal Pain Control and Function  Outcome: Progressing  Goal: Skin Health and Integrity  Outcome: Progressing  Intervention: Optimize Skin Protection  Recent Flowsheet Documentation  Taken 03/28/2024 0400 by Kara Dies, RN  Pressure Reduction  Techniques: frequent weight shift encouraged  Taken 03/28/2024 0000 by Kara Dies, RN  Pressure Reduction Techniques: frequent weight shift encouraged  Taken 03/27/2024 2000 by Kara Dies, RN  Activity Management: bedrest  Pressure Reduction Techniques: frequent weight shift encouraged  Head of Bed (HOB) Positioning: HOB at 30-45 degrees  Pressure Reduction Devices: positioning supports utilized  Skin Protection: adhesive use limited  Goal: Optimal Wound Healing  Outcome: Progressing     Problem: Skin Injury Risk Increased  Goal: Skin Health and Integrity  Outcome: Progressing  Intervention: Optimize Skin Protection  Recent Flowsheet Documentation  Taken 03/28/2024 0400 by Kara Dies, RN  Pressure Reduction Techniques: frequent weight shift encouraged  Taken 03/28/2024 0000 by Kara Dies, RN  Pressure Reduction Techniques: frequent weight shift encouraged  Taken 03/27/2024 2000 by Kara Dies, RN  Activity Management: bedrest  Pressure Reduction Techniques: frequent weight shift encouraged  Head of Bed (HOB) Positioning: HOB at 30-45 degrees  Pressure Reduction Devices: positioning supports utilized  Skin Protection: adhesive use limited     Problem: Self-Care Deficit  Goal: Improved Ability to Complete Activities of Daily Living  Outcome: Progressing     Problem: Malnutrition  Goal: Improved Nutritional Intake  Outcome: Progressing     Problem: Mechanical Ventilation Invasive  Goal: Absence of Device-Related Skin and Tissue Injury  Outcome: Progressing

## 2024-03-29 LAB — CBC
HEMATOCRIT: 25.1 % — ABNORMAL LOW (ref 34.0–44.0)
HEMOGLOBIN: 8 g/dL — ABNORMAL LOW (ref 11.3–14.9)
MEAN CORPUSCULAR HEMOGLOBIN CONC: 31.9 g/dL — ABNORMAL LOW (ref 32.0–36.0)
MEAN CORPUSCULAR HEMOGLOBIN: 28.4 pg (ref 25.9–32.4)
MEAN CORPUSCULAR VOLUME: 89.3 fL (ref 77.6–95.7)
MEAN PLATELET VOLUME: 10.2 fL (ref 6.8–10.7)
PLATELET COUNT: 244 10*9/L (ref 150–450)
RED BLOOD CELL COUNT: 2.81 10*12/L — ABNORMAL LOW (ref 3.95–5.13)
RED CELL DISTRIBUTION WIDTH: 22.1 % — ABNORMAL HIGH (ref 12.2–15.2)
WBC ADJUSTED: 9.2 10*9/L (ref 3.6–11.2)

## 2024-03-29 LAB — BASIC METABOLIC PANEL
ANION GAP: 8 mmol/L (ref 5–14)
ANION GAP: 9 mmol/L (ref 5–14)
BLOOD UREA NITROGEN: 65 mg/dL — ABNORMAL HIGH (ref 9–23)
BLOOD UREA NITROGEN: 61 mg/dL — ABNORMAL HIGH (ref 9–23)
BUN / CREAT RATIO: 66
BUN / CREAT RATIO: 65
CALCIUM: 9.4 mg/dL (ref 8.7–10.4)
CALCIUM: 9.4 mg/dL (ref 8.7–10.4)
CHLORIDE: 102 mmol/L (ref 98–107)
CHLORIDE: 101 mmol/L (ref 98–107)
CO2: 24 mmol/L (ref 20.0–31.0)
CO2: 27 mmol/L (ref 20.0–31.0)
CREATININE: 0.99 mg/dL (ref 0.55–1.02)
CREATININE: 0.94 mg/dL (ref 0.55–1.02)
EGFR CKD-EPI (2021) FEMALE: 57 mL/min/1.73m2 — ABNORMAL LOW (ref >=60)
EGFR CKD-EPI (2021) FEMALE: 61 mL/min/1.73m2 (ref >=60)
GLUCOSE RANDOM: 118 mg/dL (ref 70–179)
GLUCOSE RANDOM: 91 mg/dL (ref 70–179)
POTASSIUM: 5.3 mmol/L — ABNORMAL HIGH (ref 3.4–4.8)
POTASSIUM: 5.6 mmol/L — ABNORMAL HIGH (ref 3.4–4.8)
SODIUM: 134 mmol/L — ABNORMAL LOW (ref 135–145)
SODIUM: 137 mmol/L (ref 135–145)

## 2024-03-29 LAB — APTT
APTT: 27.5 s (ref 24.8–38.4)
HEPARIN CORRELATION: 0.2

## 2024-03-29 LAB — PHOSPHORUS: PHOSPHORUS: 3 mg/dL (ref 2.4–5.1)

## 2024-03-29 LAB — MAGNESIUM: MAGNESIUM: 2.1 mg/dL (ref 1.6–2.6)

## 2024-03-29 MED ADMIN — furosemide (LASIX) injection 40 mg: 40 mg | INTRAVENOUS | @ 15:00:00 | Stop: 2024-03-29

## 2024-03-29 MED ADMIN — enoxaparin (LOVENOX) syringe 60 mg: 60 mg | SUBCUTANEOUS | @ 15:00:00

## 2024-03-29 MED ADMIN — ipratropium (ATROVENT) 0.02 % nebulizer solution 500 mcg: 500 ug | RESPIRATORY_TRACT | @ 09:00:00

## 2024-03-29 MED ADMIN — SMOG ENEMA: 240 mL | RECTAL | @ 16:00:00 | Stop: 2024-03-29

## 2024-03-29 MED ADMIN — senna (SENOKOT) tablet 1 tablet: 1 | ORAL | @ 01:00:00

## 2024-03-29 MED ADMIN — melatonin tablet 3 mg: 3 mg | GASTROENTERAL | @ 08:00:00

## 2024-03-29 MED ADMIN — arformoterol (BROVANA) nebulizer solution 15 mcg/2 mL: 15 ug | RESPIRATORY_TRACT | @ 13:00:00

## 2024-03-29 MED ADMIN — mineral oil liquid 30 mL: 30 mL | @ 20:00:00 | Stop: 2024-03-29

## 2024-03-29 MED ADMIN — mineral oil liquid 30 mL: 30 mL | @ 23:00:00 | Stop: 2024-03-29

## 2024-03-29 MED ADMIN — insulin regular (HumuLIN,NovoLIN) injection 0-20 Units: 0-20 [IU] | SUBCUTANEOUS | @ 16:00:00

## 2024-03-29 MED ADMIN — atorvastatin (LIPITOR) tablet 40 mg: 40 mg | GASTROENTERAL | @ 12:00:00

## 2024-03-29 MED ADMIN — polyethylene glycol (MIRALAX) packet 17 g: 17 g | GASTROENTERAL | @ 01:00:00

## 2024-03-29 MED ADMIN — mineral oil liquid 30 mL: 30 mL | @ 16:00:00 | Stop: 2024-03-29

## 2024-03-29 MED ADMIN — insulin NPH (HumuLIN,NovoLIN) injection 15 Units: 15 [IU] | SUBCUTANEOUS | @ 12:00:00

## 2024-03-29 MED ADMIN — acetaminophen (TYLENOL) tablet 1,000 mg: 1000 mg | GASTROENTERAL | @ 01:00:00

## 2024-03-29 MED ADMIN — polyethylene glycol (MIRALAX) packet 17 g: 17 g | GASTROENTERAL | @ 12:00:00

## 2024-03-29 MED ADMIN — heparin (porcine) 1000 unit/mL injection 2,000 Units: 2000 [IU] | INTRAVENOUS | @ 08:00:00 | Stop: 2024-03-29

## 2024-03-29 MED ADMIN — cholecalciferol (vitamin D3) 10 mcg/mL (400 unit/mL) oral solution: 50 ug | GASTROENTERAL | @ 12:00:00

## 2024-03-29 MED ADMIN — ipratropium (ATROVENT) 0.02 % nebulizer solution 500 mcg: 500 ug | RESPIRATORY_TRACT | @ 13:00:00

## 2024-03-29 MED ADMIN — arformoterol (BROVANA) nebulizer solution 15 mcg/2 mL: 15 ug | RESPIRATORY_TRACT | @ 01:00:00

## 2024-03-29 MED ADMIN — ipratropium (ATROVENT) 0.02 % nebulizer solution 500 mcg: 500 ug | RESPIRATORY_TRACT | @ 01:00:00

## 2024-03-29 MED ADMIN — amiodarone (PACERONE) oral suspension: 200 mg | GASTROENTERAL | @ 12:00:00 | Stop: 2024-03-29

## 2024-03-29 MED ADMIN — insulin regular (HumuLIN,NovoLIN) injection 0-20 Units: 0-20 [IU] | SUBCUTANEOUS | @ 04:00:00

## 2024-03-29 NOTE — Unmapped (Signed)
 Enoxaparin Therapeutic Monitoring Pharmacy Note    Tammy Braun is a 83 y.o. female starting enoxaparin.    Indication: atrial fibrillation and pulmonary embolism (new)    Prior Dosing Information: None/new initiation    Goals:  Therapeutic Drug Levels  LMWH Anti-Xa level: 0.6-1 units/mL drawn 4-6 hours post-dose    Additional Clinical Monitoring/Outcomes  Monitor hemoglobin and platelets  Monitor for signs/symptoms of bleeding  Monitor renal function     Results:  Not applicable  Wt Readings from Last 3 Encounters:   03/29/24 68.5 kg (151 lb 0.2 oz)   02/26/24 61.2 kg (135 lb)   02/18/24 61.2 kg (135 lb)     HGB   Date Value Ref Range Status   03/29/2024 8.0 (L) 11.3 - 14.9 g/dL Final     Platelet   Date Value Ref Range Status   03/29/2024 244 150 - 450 10*9/L Final     Creatinine   Date Value Ref Range Status   03/29/2024 0.99 0.55 - 1.02 mg/dL Final       Pharmacokinetic Considerations and Significant Drug Interactions:   Concurrent antiplatelet medications: not applicable    Assessment/Plan:   Recommendation(s)  Start enoxaparin 60 mg BID. This is approximately 1 mg/kg accounting for fluid retention and recovery kidney function    Follow-up  Level due: 4-6 hours after the 3rd dose  A pharmacist will continue to monitor and recommend levels as appropriate      Please page service pharmacist with questions/clarifications.    Karren Burly, PharmD, BCPS

## 2024-03-29 NOTE — Unmapped (Addendum)
 Gave tylenol x1 for arm and leg pain. May be from neuropathy and she isn't ordered her home gabapentin. Continue TF overnight. No complaints of nausea or abdominal pain. O2 stable on 4L O2. May be able to wean. Patient was bradycardiac in the mid 50 but patient asymptomatic. Patient's daughter was concerned the amio is causing her to be bradycardiac and wants to discuss stopping the medication since she is no longer in afib. RN provided education about how amio is prevent afib. Tried to turn patient on her side overnight but patient refuses to stay in the position longer than 10 minutes. Changed dressing on midline. Incision is being held together with staples but there is a gap of about a quarter of an inch. VSS    When checking off heparin at the beginning of my shift, heparin pump was off. Day shift nurse stated she had turned it off at 1900 and were were checking lines at 1945. Since heparin may have been off for an hour, PTT was drawn about 6 hours from restart.     Problem: Adult Inpatient Plan of Care  Goal: Absence of Hospital-Acquired Illness or Injury  Outcome: Progressing  Intervention: Identify and Manage Fall Risk  Recent Flowsheet Documentation  Taken 03/29/2024 0400 by Donnie Aho, RN  Safety Interventions:   lighting adjusted for tasks/safety   fall reduction program maintained   environmental modification   nonskid shoes/slippers when out of bed  Taken 03/29/2024 0200 by Donnie Aho, RN  Safety Interventions:   lighting adjusted for tasks/safety   fall reduction program maintained   environmental modification   nonskid shoes/slippers when out of bed  Taken 03/29/2024 0000 by Donnie Aho, RN  Safety Interventions:   lighting adjusted for tasks/safety   family at bedside   fall reduction program maintained   environmental modification   nonskid shoes/slippers when out of bed   room near unit station  Taken 03/28/2024 2000 by Donnie Aho, RN  Safety Interventions:   lighting adjusted for tasks/safety   family at bedside   fall reduction program maintained   environmental modification   bed alarm   nonskid shoes/slippers when out of bed  Intervention: Prevent Skin Injury  Recent Flowsheet Documentation  Taken 03/29/2024 0400 by Donnie Aho, RN  Positioning for Skin: (floating) Other (Comment)  Device Skin Pressure Protection: tubing/devices free from skin contact  Skin Protection: tubing/devices free from skin contact  Taken 03/29/2024 0200 by Donnie Aho, RN  Positioning for Skin: (floating) Other (Comment)  Device Skin Pressure Protection: tubing/devices free from skin contact  Skin Protection: tubing/devices free from skin contact  Taken 03/29/2024 0000 by Donnie Aho, RN  Positioning for Skin: Left  Device Skin Pressure Protection: tubing/devices free from skin contact  Skin Protection: tubing/devices free from skin contact  Taken 03/28/2024 2200 by Donnie Aho, RN  Positioning for Skin: (floating) Other (Comment)  Skin Protection: tubing/devices free from skin contact  Taken 03/28/2024 2000 by Donnie Aho, RN  Positioning for Skin: (floating) Other (Comment)  Device Skin Pressure Protection:   tubing/devices free from skin contact   absorbent pad utilized/changed  Skin Protection: tubing/devices free from skin contact  Intervention: Prevent and Manage VTE (Venous Thromboembolism) Risk  Recent Flowsheet Documentation  Taken 03/29/2024 0400 by Donnie Aho, RN  Anti-Embolism Device Type: SCD, Knee  Anti-Embolism Device Status: Refused  Taken 03/29/2024 0200 by Donnie Aho, RN  Anti-Embolism Device Type: SCD, Knee  Anti-Embolism Device Status: Refused  Taken 03/29/2024 0000 by Donnie Aho, RN  Anti-Embolism Device Type: SCD, Knee  Anti-Embolism Device Status: Refused  Taken 03/28/2024 2200 by Donnie Aho, RN  Anti-Embolism Device Type: SCD, Knee  Anti-Embolism Device Status: Refused  Taken 03/28/2024 2000 by Donnie Aho, RN  Anti-Embolism Device Type: SCD, Knee  Anti-Embolism Device Status: Refused  Intervention: Prevent Infection  Recent Flowsheet Documentation  Taken 03/29/2024 0400 by Donnie Aho, RN  Infection Prevention:   single patient room provided   rest/sleep promoted  Taken 03/29/2024 0200 by Donnie Aho, RN  Infection Prevention:   single patient room provided   rest/sleep promoted  Taken 03/29/2024 0000 by Donnie Aho, RN  Infection Prevention:   single patient room provided   rest/sleep promoted   personal protective equipment utilized  Taken 03/28/2024 2200 by Donnie Aho, RN  Infection Prevention:   single patient room provided   rest/sleep promoted  Taken 03/28/2024 2000 by Donnie Aho, RN  Infection Prevention:   single patient room provided   rest/sleep promoted

## 2024-03-29 NOTE — Unmapped (Signed)
 [place] TRAUMA, ACUTE CARE, and GENERAL SURGERY   - Surgery Daily Progress Note -  03/29/2024     Admit Date: 02/26/2024, Hospital Day: 54  Hospital Service: [facility])  Attending: Aris Georgia, MD  [facility] - [facility]  General / Trauma Surgery - Trauma/CC     Assessment     Tammy Braun 83 y.o. female, PMH bladder cancer with cystectomy and ileal conduit, COPD, recent dx of afib, moderate-severe TR, CAD (high calcification score), T2DM presented to [facility] with projectile vomiting and abdominal pain, found to have SBO with transition point at level of ileostomy on CT A/P. OR on 02/28/24 for ex-lap, reduction of parastomal hernia, small bowel resection with primary anastomosis and lysis of adhesions.     Interval Events:  None       Plan     Neuro:   - Acute pain:Well controlled  - Tylenol PRN  - at bedtime robaxin (takes flexeril at bedtime at home)    *Insomnia  - Melatonin PRN    *Schizoaffective disorder  - Continue Prolixin monthly (last dose 3/14)      CV:   *Hx HTN   - HDS  - Holding home amlodipine given low/normal pressures     *HFpEF, pulmonary HTN   - ECHO 3/12- EF 70%, severe pulmonary HTN     *Hx of HLD  - Continue lipitor 40mg  daily    *Afib-flutter w/ RVR:  *Hx of paroxysmal Afib  - Was taking diltiazem outpatient; previously held due to likely poor absorption and started on amiodarone in ICU.  - Consulted cardiology 3/28 for amiodarone plan; recommend continuining PO amiodarone 200 mg daily through admission and upon discharge (even with bradycardia, as they feel she is at her baseline rate s/p conversion). Can decrease to 100 mg if desired (daughter preference), so will reduce dose today).   -Page closer to DC for follow-up. Can convert to DOAC as able. (Transitioning to lovenox as below)        Resp:   *Acute on chronic hypoxemia, hx COPD  *Pulmonary edema  *Pulmonary HTN   - Stable on 3-4L, Wean supplemental oxygen as able, sat goal > 88%   - Continue pulmonary toliet: OOB and IS   - Continue atrovent q6h, brovana BID   - CXR today c/w volume overload. Lasix 40mg  BID x2 doses to ensure BP holds    *Pulmonary embolism:   - 3/4 CTA chest: filling defect in L anterior segmental artery    - Heparin gtt  transitioned to therapeutic lovenox today. Plan for DOAC closer to discharge.     Fen/GI:   - Diet: Regular diet with food cut up in small pieces. Cal count in place. Supplements ordered.   - Vital AF via corpak @ 39ml/hr w/ FWF 100 mL q2h, night cycled 1800-1000. Consider TF change if hyperkalemia persists  - Severe Protein-Calorie Malnutrition in the context of acute illness or injury (03/10/24 0901)  Fat Loss: Moderate  Muscle Loss: Severe  Fluid Accumulation: Moderate  Malnutrition Score: 3  - Fluids: ML   - Zofran as needed for nausea   - Replete lytes prn: None needed today. Mild hyperkalemia, receiving lasix as above and will repeat BMP this evening  - Bowel regimen: Miralax, senna. Small BM after suppository yesterday, so will trial SMOG and mineral oil via Corpak today    #S/p ex-lap, reduction of parastomal hernia, small bowel resection with primary anastomosis, lysis of adhesions   - WTD dressing changes  daily per nursing to midline incision    #Large hiatal hernia, chronic  - re-eval on CT chest/abd/pelvis 3/13 stable    GU:   - Adequate UOP.   - Cr WNL  - Voiding spontaneously--> urostomy     *AKI on CKD (resolved)  - Cr improved  - Received CRRT x2  - Nephrology signed off  - Diuresis as needed    *H/o bladder cancer s/p radical cystectomy with ileal conduit (2012):  *Parastomal hernia:  - Strict I&Os  - Ok for no foley in stoma at this time per urology  - Adequate UOP from stoma    Heme  - Hgb stable    ID:   - Afebrile.  - WBC- No leukocytosis.  - C-Diff negative 3/26    Endocrine:   *Hyperglycemia   - NPH 15 units BID   - Regular SSI q6hr   - POCT    PPx:   Therapeutic lovenox    Dispo: Stepdown status  PT: 5XL  OT: 5XL  ST: 5X  Barriers to discharge:  long term feeding plan, O2 requirement, cardiology recs, dispo  CM/SW assisting in discharge planning.     Contact: [facility] Stepdown [phone]  Gabriel Rainwater Marshella Tello, AGNP        Subjective     No acute events overnight. Asks when can I go home?.       Objective     Vitals:   Temp:  [36.4 ??C (97.5 ??F)-36.6 ??C (97.8 ??F)] 36.4 ??C (97.5 ??F)  Heart Rate:  [52-58] 58  SpO2 Pulse:  [52-58] 55  Resp:  [16-32] 25  BP: (105-127)/(50-66) 112/54  MAP (mmHg):  [70-86] 74  SpO2:  [91 %-100 %] 95 %    Intake/Output last 24 hours:  I/O last 3 completed shifts:  In: 3079.3 [P.O.:50; I.V.:399.3; NG/GT:2630]  Out: 2700 [Urine:2700]    Physical Exam:    -General:  Appropriate, comfortable and in no apparent distress.   -Neurological: Alert and oriented with appropriate conversation. Moves all 4 extremities spontaneously.   -ENT: Corpak in place with TF infusing  -Psych: speech appropriate, pleasant affect  -Cardiovascular: Regular rate and rhythm, SB  -Pulmonary: Normal work of breathing. No accessory muscle use.  -Abdomen: Soft, non-distended. Appropriately tender to palpation post- operatively. No rebound or guarding. Midline incision with island dressing place. 2 small openings packed. Fibrinous tissue noted. No purulence.  -Genitourinary: Voiding spontaneously.  Ileal conduit urostomy pink and moist with good UOP.  -Extremities: Warm, well perfused, normal skin turgor. Mild LE edema      Data Review:    I have reviewed the labs and studies from the last 24 hours.    Imaging: Radiology studies were personally reviewed

## 2024-03-30 LAB — BASIC METABOLIC PANEL
ANION GAP: 6 mmol/L (ref 5–14)
BLOOD UREA NITROGEN: 60 mg/dL — ABNORMAL HIGH (ref 9–23)
BUN / CREAT RATIO: 62
CALCIUM: 9.7 mg/dL (ref 8.7–10.4)
CHLORIDE: 100 mmol/L (ref 98–107)
CO2: 28 mmol/L (ref 20.0–31.0)
CREATININE: 0.97 mg/dL (ref 0.55–1.02)
EGFR CKD-EPI (2021) FEMALE: 59 mL/min/1.73m2 — ABNORMAL LOW (ref >=60)
GLUCOSE RANDOM: 156 mg/dL (ref 70–179)
POTASSIUM: 5.5 mmol/L — ABNORMAL HIGH (ref 3.4–4.8)
SODIUM: 134 mmol/L — ABNORMAL LOW (ref 135–145)

## 2024-03-30 LAB — POTASSIUM: POTASSIUM: 5.4 mmol/L — ABNORMAL HIGH (ref 3.4–4.8)

## 2024-03-30 LAB — CBC
HEMATOCRIT: 25.4 % — ABNORMAL LOW (ref 34.0–44.0)
HEMOGLOBIN: 8.1 g/dL — ABNORMAL LOW (ref 11.3–14.9)
MEAN CORPUSCULAR HEMOGLOBIN CONC: 31.8 g/dL — ABNORMAL LOW (ref 32.0–36.0)
MEAN CORPUSCULAR HEMOGLOBIN: 28.7 pg (ref 25.9–32.4)
MEAN CORPUSCULAR VOLUME: 90.2 fL (ref 77.6–95.7)
MEAN PLATELET VOLUME: 10.3 fL (ref 6.8–10.7)
PLATELET COUNT: 272 10*9/L (ref 150–450)
RED BLOOD CELL COUNT: 2.81 10*12/L — ABNORMAL LOW (ref 3.95–5.13)
RED CELL DISTRIBUTION WIDTH: 21.8 % — ABNORMAL HIGH (ref 12.2–15.2)
WBC ADJUSTED: 8.3 10*9/L (ref 3.6–11.2)

## 2024-03-30 LAB — LMWH ANTI-XA: HEPARIN LOW MOLECULAR WEIGHT: 1.21 [IU]/mL

## 2024-03-30 LAB — PHOSPHORUS: PHOSPHORUS: 3.5 mg/dL (ref 2.4–5.1)

## 2024-03-30 MED ORDER — ELIQUIS 5 MG TABLET
ORAL_TABLET | Freq: Two times a day (BID) | ORAL | 0 refills | 30 days | Status: CP
Start: 2024-03-30 — End: 2024-03-30

## 2024-03-30 MED ADMIN — carboxymethylcellulose sodium (THERATEARS) 0.25 % ophthalmic solution 2 drop: 2 [drp] | OPHTHALMIC | @ 13:00:00

## 2024-03-30 MED ADMIN — insulin NPH (HumuLIN,NovoLIN) injection 15 Units: 15 [IU] | SUBCUTANEOUS | @ 13:00:00

## 2024-03-30 MED ADMIN — furosemide (LASIX) injection 40 mg: 40 mg | INTRAVENOUS | @ 10:00:00 | Stop: 2024-03-30

## 2024-03-30 MED ADMIN — furosemide (LASIX) injection 40 mg: 40 mg | INTRAVENOUS | @ 19:00:00 | Stop: 2024-03-30

## 2024-03-30 MED ADMIN — enoxaparin (LOVENOX) syringe 60 mg: 60 mg | SUBCUTANEOUS | @ 01:00:00

## 2024-03-30 MED ADMIN — ipratropium (ATROVENT) 0.02 % nebulizer solution 500 mcg: 500 ug | RESPIRATORY_TRACT | @ 20:00:00

## 2024-03-30 MED ADMIN — amiodarone (PACERONE) oral suspension: 100 mg | GASTROENTERAL | @ 13:00:00

## 2024-03-30 MED ADMIN — atorvastatin (LIPITOR) tablet 40 mg: 40 mg | GASTROENTERAL | @ 13:00:00

## 2024-03-30 MED ADMIN — senna (SENOKOT) tablet 1 tablet: 1 | ORAL | @ 01:00:00

## 2024-03-30 MED ADMIN — insulin regular (HumuLIN,NovoLIN) injection 0-20 Units: 0-20 [IU] | SUBCUTANEOUS | @ 16:00:00

## 2024-03-30 MED ADMIN — acetaminophen (TYLENOL) tablet 1,000 mg: 1000 mg | GASTROENTERAL | @ 01:00:00

## 2024-03-30 MED ADMIN — SMOG ENEMA: 240 mL | RECTAL | @ 14:00:00 | Stop: 2024-03-30

## 2024-03-30 MED ADMIN — polyethylene glycol (MIRALAX) packet 17 g: 17 g | GASTROENTERAL | @ 01:00:00

## 2024-03-30 MED ADMIN — insulin regular (HumuLIN,NovoLIN) injection 0-20 Units: 0-20 [IU] | SUBCUTANEOUS | @ 10:00:00

## 2024-03-30 MED ADMIN — methocarbamol (ROBAXIN) tablet 500 mg: 500 mg | ORAL | @ 01:00:00

## 2024-03-30 MED ADMIN — mineral oil liquid 30 mL: 30 mL | @ 04:00:00 | Stop: 2024-03-29

## 2024-03-30 MED ADMIN — arformoterol (BROVANA) nebulizer solution 15 mcg/2 mL: 15 ug | RESPIRATORY_TRACT | @ 12:00:00

## 2024-03-30 MED ADMIN — ipratropium (ATROVENT) 0.02 % nebulizer solution 500 mcg: 500 ug | RESPIRATORY_TRACT | @ 01:00:00

## 2024-03-30 MED ADMIN — polyethylene glycol (MIRALAX) packet 17 g: 17 g | GASTROENTERAL | @ 13:00:00

## 2024-03-30 MED ADMIN — enoxaparin (LOVENOX) syringe 60 mg: 60 mg | SUBCUTANEOUS | @ 13:00:00 | Stop: 2024-03-30

## 2024-03-30 MED ADMIN — melatonin tablet 3 mg: 3 mg | GASTROENTERAL | @ 01:00:00

## 2024-03-30 MED ADMIN — ipratropium (ATROVENT) 0.02 % nebulizer solution 500 mcg: 500 ug | RESPIRATORY_TRACT | @ 08:00:00

## 2024-03-30 MED ADMIN — arformoterol (BROVANA) nebulizer solution 15 mcg/2 mL: 15 ug | RESPIRATORY_TRACT | @ 01:00:00

## 2024-03-30 NOTE — Unmapped (Signed)
 No complaints of pain. Did report SOB in the early morning. Although O2 sats were stable on 4-5L of O2, her breathing was fast and shallow. She also was quick to desat with movement because she is a mouth breather. Notified resident who ordered a CXR. Already scheduled for Lasix at 0600 so gave that. Sat up at the side of the bed twice and made her stay there for more than 5 minutes. Continue TF because of poor nutrition. Continue calorie count. No Bms overnight. VSS    Problem: Adult Inpatient Plan of Care  Goal: Absence of Hospital-Acquired Illness or Injury  Outcome: Progressing  Intervention: Identify and Manage Fall Risk  Recent Flowsheet Documentation  Taken 03/30/2024 0600 by Donnie Aho, RN  Safety Interventions:   lighting adjusted for tasks/safety   fall reduction program maintained   environmental modification  Taken 03/30/2024 0400 by Donnie Aho, RN  Safety Interventions:   lighting adjusted for tasks/safety   family at bedside   fall reduction program maintained   environmental modification   room near unit station   nonskid shoes/slippers when out of bed  Taken 03/30/2024 0000 by Donnie Aho, RN  Safety Interventions:   lighting adjusted for tasks/safety   fall reduction program maintained   family at bedside   environmental modification   enteral feeding safety  Taken 03/29/2024 2200 by Donnie Aho, RN  Safety Interventions:   lighting adjusted for tasks/safety   family at bedside   fall reduction program maintained   environmental modification   room near unit station   nonskid shoes/slippers when out of bed  Taken 03/29/2024 2000 by Donnie Aho, RN  Safety Interventions:   lighting adjusted for tasks/safety   fall reduction program maintained   environmental modification   bed alarm   family at bedside  Intervention: Prevent Skin Injury  Recent Flowsheet Documentation  Taken 03/30/2024 0600 by Donnie Aho, RN  Positioning for Skin: Left  Device Skin Pressure Protection: absorbent pad utilized/changed  Skin Protection: tubing/devices free from skin contact  Taken 03/30/2024 0400 by Donnie Aho, RN  Positioning for Skin: Right  Device Skin Pressure Protection: absorbent pad utilized/changed  Taken 03/30/2024 0200 by Donnie Aho, RN  Positioning for Skin: Left  Device Skin Pressure Protection: absorbent pad utilized/changed  Taken 03/30/2024 0000 by Donnie Aho, RN  Positioning for Skin: Right  Device Skin Pressure Protection: absorbent pad utilized/changed  Skin Protection: tubing/devices free from skin contact  Taken 03/29/2024 2200 by Donnie Aho, RN  Positioning for Skin: (floating) Other (Comment)  Device Skin Pressure Protection: absorbent pad utilized/changed  Skin Protection: tubing/devices free from skin contact  Taken 03/29/2024 2000 by Donnie Aho, RN  Positioning for Skin: Left  Skin Protection: tubing/devices free from skin contact  Intervention: Prevent and Manage VTE (Venous Thromboembolism) Risk  Recent Flowsheet Documentation  Taken 03/30/2024 0645 by Donnie Aho, RN  Anti-Embolism Device Status: (Lovenox) Other (Comment)  Taken 03/30/2024 0600 by Donnie Aho, RN  Anti-Embolism Device Status: (Lovenox) Other (Comment)  Taken 03/30/2024 0400 by Donnie Aho, RN  Anti-Embolism Device Status: (Lovenox) Other (Comment)  Taken 03/30/2024 0200 by Donnie Aho, RN  Anti-Embolism Device Status: (Lovenox) Other (Comment)  Taken 03/30/2024 0000 by Donnie Aho, RN  Anti-Embolism Device Status: (Lovenox) Other (Comment)  Taken 03/29/2024 2200 by Donnie Aho, RN  Anti-Embolism Device Status: (Lovenox) Other (Comment)  Taken 03/29/2024 2000 by Donnie Aho, RN  Anti-Embolism Device Status: (Lovenox) Other (Comment)  Intervention: Prevent Infection  Recent Flowsheet Documentation  Taken 03/30/2024  0600 by Donnie Aho, RN  Infection Prevention:   single patient room provided   rest/sleep promoted  Taken 03/30/2024 0000 by Donnie Aho, RN  Infection Prevention:   single patient room provided   rest/sleep promoted  Taken 03/29/2024 2200 by Donnie Aho, RN  Infection Prevention:   single patient room provided   personal protective equipment utilized   rest/sleep promoted  Taken 03/29/2024 2000 by Donnie Aho, RN  Infection Prevention:   single patient room provided   rest/sleep promoted

## 2024-03-30 NOTE — Unmapped (Signed)
 Palliative Care Sign Off Note      Consultation from Requesting Attending Physician:  [name], MD  Primary Care Provider:  Vanetta Shawl, MD    Assessment/Plan:      SUMMARY: This 83 y.o. patient is seriously and acutely ill due to small bowel obstruction, complicated by co-morbid acute and chronic conditions including acute hypoxic respiratory failure requiring intubation x2, aspiration, hiatal hernia, s/p OR on 02/28/24 for ex-lap, reduciton of parastomal hernia, small bowel resection with primary anastomosis and lysis of adhesions, AKI on CKD, atrial fibrillation, pulmonary embolism, schizoaffective disorder, HFpEF, pulmonary hypertension, HTN, COPD, bladder cancer with cystectomy and ileal conduit, moderate-severe TR, CAD (high calcification score), T2DM.     Symptom Assessment and Recommendations:    None needed at this time    Goals of care / ACP:  Code Status:   Code Status: Full Code   Healthcare decision-maker if lacks capacity:   HCDM (patient stated preference) (Active): Tammy Braun - Daughter - [phone]      Current goals of care as discussed on 3/31: Continue current medical management. Patient and family previously decided on no tracheostomy but would be okay with re-intubation if needed. Discussion regarding long-term nutrition ongoing; cleared for PO diet based on barium swallow.        Practical, Emotional, Spiritual Support Recommendations:     - Communicated with primary team via Epic chat        Thank you for this consult. As goals of care are clear, we will plan to sign off. If clinical status changes or questions arise, please do not hesitate to reach back out.    Beryle Beams, PA-C  Physician Assistant  Palliative Care

## 2024-03-30 NOTE — Unmapped (Signed)
 [place] [facility] TRAUMA, ACUTE CARE, and GENERAL SURGERY   - Surgery Daily Progress Note -  03/30/2024     Admit Date: 02/26/2024, Hospital Day: 97  Hospital Service: [facility])  Attending: Sheralyn Dies, MD  [facility] - SRH-4  General / Trauma Surgery - Trauma/CC     Assessment     Tammy Braun 83 y.o. female, PMH bladder cancer with cystectomy and ileal conduit, COPD, recent dx of afib, moderate-severe TR, CAD (high calcification score), T2DM presented to [facility] with projectile vomiting and abdominal pain, found to have SBO with transition point at level of ileostomy on CT A/P. OR on 02/28/24 for ex-lap, reduction of parastomal hernia, small bowel resection with primary anastomosis and lysis of adhesions.     Interval Events:  Around 0200 this morning, complaints of shortness of breath, chest XR obtained showed: worsened right lung, Lasix dose IV given early by cross cover        Plan     Neuro:   - Acute pain:Well controlled  - Tylenol PRN  - at bedtime robaxin (takes flexeril at bedtime at home)    *Insomnia  - Melatonin PRN    *Schizoaffective disorder  - Continue Prolixin monthly (last dose 3/14)      CV:   *Hx HTN   - HDS  - Holding home amlodipine given low/normal pressures     *HFpEF, pulmonary HTN   - ECHO 3/12- EF 70%, severe pulmonary HTN     *Hx of HLD  - Continue lipitor 40mg  daily    *Afib-flutter w/ RVR:  *Hx of paroxysmal Afib  - Was taking diltiazem outpatient; previously held due to likely poor absorption and started on amiodarone in ICU.  - Consulted cardiology 3/28 for amiodarone plan; recommend continuining PO amiodarone 200 mg daily through admission and upon discharge (even with bradycardia, as they feel she is at her baseline rate s/p conversion). Can decrease to 100 mg if desired (daughter preference), reduced to 100 mg on 3/30  -Page closer to DC for follow-up. Can convert to DOAC as able. (Transitioning to lovenox as below)      Resp:   *Acute on chronic hypoxemia, hx COPD  *Pulmonary edema  *Pulmonary HTN   - 02 requirement now 5L via [place], Wean supplemental oxygen as able, sat goal > 88%   - Continue pulmonary toliet: OOB and IS   - Continue atrovent q6h, brovana BID   - CXR today: worsened right lung, Lasix 40mg  IV given early, will recheck another CXR at noon today    *Pulmonary embolism:   - 3/4 CTA chest: filling defect in L anterior segmental artery    - Heparin gtt  transitioned to therapeutic lovenox 3/30  - Plan for DOAC closer to discharge    Fen/GI:   - Diet: Regular diet with food cut up in small pieces. Cal count in place. Supplements ordered.   - Vital AF via corpak @ 76ml/hr w/ FWF 100 mL q2h, night cycled 1800-1000. Consider TF change if hyperkalemia persists (reached out to RD this morning)  - Severe Protein-Calorie Malnutrition in the context of acute illness or injury (03/10/24 0901)  Fat Loss: Moderate  Muscle Loss: Severe  Fluid Accumulation: Moderate  Malnutrition Score: 3  - Fluids: ML   - Zofran as needed for nausea   - Replete lytes prn: None needed today. Mild hyperkalemia, receiving lasix as above and will repeat BMP at noon   - Bowel regimen: Miralax, senna. Small BM after  suppository on 3/29, SMOG and mineral oil via Corpak on 3/30, no results, will reorder SMOG again today     #S/p ex-lap, reduction of parastomal hernia, small bowel resection with primary anastomosis, lysis of adhesions   - WTD dressing changes daily per nursing to midline incision    #Large hiatal hernia, chronic  - re-eval on CT chest/abd/pelvis 3/13 stable    GU:   - Adequate UOP.   - Cr WNL  - Voiding spontaneously--> urostomy     *AKI on CKD (resolved)  - Cr improved  - Received CRRT x2  - Nephrology signed off  - Diuresis as needed    *H/o bladder cancer s/p radical cystectomy with ileal conduit (2012):  *Parastomal hernia:  - Strict I&Os  - Ok for no foley in stoma at this time per urology  - Adequate UOP from stoma    Heme  - Hgb stable    ID:   - Afebrile.  - WBC- No leukocytosis.  - C-Diff negative 3/26    Endocrine:   *Hyperglycemia   - NPH 15 units BID   - Regular SSI q6hr   - POCT    PPx:   Therapeutic lovenox    Dispo: Stepdown status  PT: 5XL  OT: 5XL  ST: 5X  Barriers to discharge:  long term feeding plan, increase in O2 requirement, cardiology recs, dispo  CM/SW assisting in discharge planning.     Contact: [facility] Stepdown [phone]  [facility] [place], [place]        Subjective     Wanting to eat breakfast, states that her shortness of breath is better      Objective     Vitals:   Temp:  [36.4 ??C (97.5 ??F)-36.8 ??C (98.3 ??F)] 36.4 ??C (97.5 ??F)  Heart Rate:  [49-58] 58  SpO2 Pulse:  [49-58] 58  Resp:  [16-24] 20  BP: (117-147)/(47-70) 122/52  MAP (mmHg):  [74-95] 74  SpO2:  [93 %-100 %] 96 %    Intake/Output last 24 hours:  I/O last 3 completed shifts:  In: 3536.9 [P.O.:250; I.V.:366.9; NG/GT:2920]  Out: 2900 [Urine:2900]    Physical Exam:    -General:  Appropriate, comfortable and in no apparent distress.   -Neurological: Alert and oriented with appropriate conversation. Moves all 4 extremities spontaneously.   -ENT: Corpak in place with TF infusing  -Psych: speech appropriate, pleasant affect  -Cardiovascular: Regular rate and rhythm  -Pulmonary: Normal work of breathing. No accessory muscle use, currently on 5L via [place]   -Abdomen: Soft, non-distended. Appropriately tender to palpation post- operatively. No rebound or guarding. Midline incision with island dressing place. 2 small openings packed. Fibrinous tissue noted. No purulence.  -Genitourinary: Voiding spontaneously.  Ileal conduit urostomy pink and moist with good UOP.  -Extremities: Warm, well perfused, normal skin turgor. Mild LE edema      Data Review:    I have reviewed the labs and studies from the last 24 hours.    Imaging: Radiology studies were personally reviewed       ATTENDING ATTESTATION:  I was the supervising physician in the delivery of the service.  ______________________________

## 2024-03-30 NOTE — Unmapped (Signed)
 Problem: Breathing Pattern Ineffective  Goal: Effective Breathing Pattern  Outcome: Progressing     Patient on 2lpm [place] within saturation goals of >88%.  No distress noted.  Education provided in regards to patient understanding of SpO2 goals.

## 2024-03-30 NOTE — Unmapped (Signed)
 Enoxaparin Therapeutic Monitoring Pharmacy Note    Tammy Braun is a 83 y.o. female starting enoxaparin.    Indication: atrial fibrillation and pulmonary embolism (new)    Prior Dosing Information: None/new initiation    Goals:  Therapeutic Drug Levels  LMWH Anti-Xa level: 0.6-1 units/mL drawn 4-6 hours post-dose    Additional Clinical Monitoring/Outcomes  Monitor hemoglobin and platelets  Monitor for signs/symptoms of bleeding  Monitor renal function     Results:  LMWH Anti-Xa level 1.21 units/mL, drawn appropriately  Wt Readings from Last 3 Encounters:   03/29/24 68.5 kg (151 lb 0.2 oz)   02/26/24 61.2 kg (135 lb)   02/18/24 61.2 kg (135 lb)     HGB   Date Value Ref Range Status   03/30/2024 8.1 (L) 11.3 - 14.9 g/dL Final     Platelet   Date Value Ref Range Status   03/30/2024 272 150 - 450 10*9/L Final     Creatinine   Date Value Ref Range Status   03/30/2024 0.97 0.55 - 1.02 mg/dL Final       Pharmacokinetic Considerations and Significant Drug Interactions:   Concurrent antiplatelet medications: not applicable    Assessment/Plan:   Recommendation(s)  Change current regimen to 50 mg BID    Follow-up  Level due: No levels indicated at this time  A pharmacist will continue to monitor and recommend levels as appropriate      Please page service pharmacist with questions/clarifications.    Ardath Sax, PharmD, BCPS

## 2024-03-31 LAB — BASIC METABOLIC PANEL
ANION GAP: 8 mmol/L (ref 5–14)
BLOOD UREA NITROGEN: 59 mg/dL — ABNORMAL HIGH (ref 9–23)
BUN / CREAT RATIO: 59
CALCIUM: 9.7 mg/dL (ref 8.7–10.4)
CHLORIDE: 100 mmol/L (ref 98–107)
CO2: 30 mmol/L (ref 20.0–31.0)
CREATININE: 1 mg/dL (ref 0.55–1.02)
EGFR CKD-EPI (2021) FEMALE: 57 mL/min/1.73m2 — ABNORMAL LOW (ref >=60)
GLUCOSE RANDOM: 132 mg/dL (ref 70–179)
POTASSIUM: 5.2 mmol/L — ABNORMAL HIGH (ref 3.4–4.8)
SODIUM: 138 mmol/L (ref 135–145)

## 2024-03-31 LAB — MAGNESIUM: MAGNESIUM: 2 mg/dL (ref 1.6–2.6)

## 2024-03-31 LAB — PHOSPHORUS: PHOSPHORUS: 3.5 mg/dL (ref 2.4–5.1)

## 2024-03-31 LAB — CBC
HEMATOCRIT: 25.4 % — ABNORMAL LOW (ref 34.0–44.0)
HEMOGLOBIN: 7.9 g/dL — ABNORMAL LOW (ref 11.3–14.9)
MEAN CORPUSCULAR HEMOGLOBIN CONC: 31.1 g/dL — ABNORMAL LOW (ref 32.0–36.0)
MEAN CORPUSCULAR HEMOGLOBIN: 28.2 pg (ref 25.9–32.4)
MEAN CORPUSCULAR VOLUME: 90.9 fL (ref 77.6–95.7)
MEAN PLATELET VOLUME: 9.8 fL (ref 6.8–10.7)
PLATELET COUNT: 279 10*9/L (ref 150–450)
RED BLOOD CELL COUNT: 2.79 10*12/L — ABNORMAL LOW (ref 3.95–5.13)
RED CELL DISTRIBUTION WIDTH: 21.9 % — ABNORMAL HIGH (ref 12.2–15.2)
WBC ADJUSTED: 7.1 10*9/L (ref 3.6–11.2)

## 2024-03-31 MED ADMIN — ipratropium (ATROVENT) 0.02 % nebulizer solution 500 mcg: 500 ug | RESPIRATORY_TRACT | @ 07:00:00

## 2024-03-31 MED ADMIN — arformoterol (BROVANA) nebulizer solution 15 mcg/2 mL: 15 ug | RESPIRATORY_TRACT | @ 01:00:00

## 2024-03-31 MED ADMIN — melatonin tablet 4.5 mg: 4.5 mg | GASTROENTERAL | @ 04:00:00

## 2024-03-31 MED ADMIN — methocarbamol (ROBAXIN) tablet 500 mg: 500 mg | ORAL | @ 01:00:00

## 2024-03-31 MED ADMIN — senna (SENOKOT) tablet 1 tablet: 1 | ORAL | @ 01:00:00

## 2024-03-31 MED ADMIN — polyethylene glycol (MIRALAX) packet 17 g: 17 g | GASTROENTERAL | @ 01:00:00

## 2024-03-31 MED ADMIN — ipratropium (ATROVENT) 0.02 % nebulizer solution 500 mcg: 500 ug | RESPIRATORY_TRACT | @ 21:00:00

## 2024-03-31 MED ADMIN — insulin NPH (HumuLIN,NovoLIN) injection 15 Units: 15 [IU] | SUBCUTANEOUS | @ 22:00:00

## 2024-03-31 MED ADMIN — amiodarone (PACERONE) oral suspension: 100 mg | GASTROENTERAL | @ 13:00:00 | Stop: 2024-03-31

## 2024-03-31 MED ADMIN — SMOG ENEMA: 240 mL | RECTAL | @ 16:00:00 | Stop: 2024-03-31

## 2024-03-31 MED ADMIN — cholecalciferol (vitamin D3) 10 mcg/mL (400 unit/mL) oral solution: 50 ug | GASTROENTERAL | @ 13:00:00

## 2024-03-31 MED ADMIN — ipratropium (ATROVENT) 0.02 % nebulizer solution 500 mcg: 500 ug | RESPIRATORY_TRACT | @ 01:00:00

## 2024-03-31 MED ADMIN — polyethylene glycol (MIRALAX) packet 17 g: 17 g | GASTROENTERAL | @ 13:00:00

## 2024-03-31 MED ADMIN — enoxaparin (LOVENOX) syringe 50 mg: 50 mg | SUBCUTANEOUS | @ 13:00:00

## 2024-03-31 MED ADMIN — atorvastatin (LIPITOR) tablet 40 mg: 40 mg | GASTROENTERAL | @ 13:00:00

## 2024-03-31 MED ADMIN — acetaminophen (TYLENOL) tablet 1,000 mg: 1000 mg | GASTROENTERAL | @ 20:00:00

## 2024-03-31 MED ADMIN — insulin NPH (HumuLIN,NovoLIN) injection 15 Units: 15 [IU] | SUBCUTANEOUS | @ 13:00:00

## 2024-03-31 MED ADMIN — insulin regular (HumuLIN,NovoLIN) injection 0-20 Units: 0-20 [IU] | SUBCUTANEOUS | @ 04:00:00

## 2024-03-31 MED ADMIN — insulin regular (HumuLIN,NovoLIN) injection 0-20 Units: 0-20 [IU] | SUBCUTANEOUS | @ 22:00:00

## 2024-03-31 MED ADMIN — furosemide (LASIX) injection 40 mg: 40 mg | INTRAVENOUS | @ 17:00:00 | Stop: 2024-03-31

## 2024-03-31 MED ADMIN — ipratropium (ATROVENT) 0.02 % nebulizer solution 500 mcg: 500 ug | RESPIRATORY_TRACT | @ 14:00:00

## 2024-03-31 MED ADMIN — enoxaparin (LOVENOX) syringe 50 mg: 50 mg | SUBCUTANEOUS | @ 01:00:00

## 2024-03-31 MED ADMIN — arformoterol (BROVANA) nebulizer solution 15 mcg/2 mL: 15 ug | RESPIRATORY_TRACT | @ 13:00:00

## 2024-03-31 MED ADMIN — acetaminophen (TYLENOL) tablet 1,000 mg: 1000 mg | GASTROENTERAL | @ 07:00:00

## 2024-03-31 NOTE — Unmapped (Signed)
 Problem: Adult Inpatient Plan of Care  Goal: Plan of Care Review  Outcome: Ongoing - Unchanged  Goal: Patient-Specific Goal (Individualized)  Outcome: Ongoing - Unchanged  Goal: Absence of Hospital-Acquired Illness or Injury  Outcome: Ongoing - Unchanged  Intervention: Identify and Manage Fall Risk  Recent Flowsheet Documentation  Taken 03/31/2024 0800 by Odis Hollingshead, RN  Safety Interventions:   aspiration precautions   enteral feeding safety  Intervention: Prevent Skin Injury  Recent Flowsheet Documentation  Taken 03/31/2024 1800 by Odis Hollingshead, RN  Positioning for Skin: Right  Taken 03/31/2024 1600 by Odis Hollingshead, RN  Positioning for Skin: Right  Taken 03/31/2024 1400 by Odis Hollingshead, RN  Positioning for Skin: Left  Taken 03/31/2024 1200 by Odis Hollingshead, RN  Positioning for Skin: Right  Taken 03/31/2024 1000 by Odis Hollingshead, RN  Positioning for Skin: Left  Taken 03/31/2024 0800 by Odis Hollingshead, RN  Positioning for Skin: Right  Intervention: Prevent and Manage VTE (Venous Thromboembolism) Risk  Recent Flowsheet Documentation  Taken 03/31/2024 1800 by Odis Hollingshead, RN  Anti-Embolism Device Type: SCD, Knee  Anti-Embolism Device Status: On  Anti-Embolism Device Location: BLE  Taken 03/31/2024 1600 by Odis Hollingshead, RN  Anti-Embolism Device Type: SCD, Knee  Anti-Embolism Device Status: On  Anti-Embolism Device Location: BLE  Taken 03/31/2024 1400 by Odis Hollingshead, RN  Anti-Embolism Device Type: SCD, Knee  Anti-Embolism Device Status: On  Anti-Embolism Device Location: BLE  Taken 03/31/2024 1200 by Odis Hollingshead, RN  Anti-Embolism Device Type: SCD, Knee  Anti-Embolism Device Status: On  Anti-Embolism Device Location: BLE  Taken 03/31/2024 0800 by Odis Hollingshead, RN  Anti-Embolism Device Type: SCD, Knee  Anti-Embolism Device Status: On  Anti-Embolism Device Location: BLE  Intervention: Prevent Infection  Recent Flowsheet Documentation  Taken 03/31/2024 0800 by Odis Hollingshead, RN  Infection Prevention:   cohorting utilized   environmental surveillance performed  Goal: Optimal Comfort and Wellbeing  Outcome: Ongoing - Unchanged  Goal: Readiness for Transition of Care  Outcome: Ongoing - Unchanged  Goal: Rounds/Family Conference  Outcome: Ongoing - Unchanged     Problem: Infection  Goal: Absence of Infection Signs and Symptoms  Outcome: Ongoing - Unchanged  Intervention: Prevent or Manage Infection  Recent Flowsheet Documentation  Taken 03/31/2024 0800 by Odis Hollingshead, RN  Infection Management: aseptic technique maintained  Isolation Precautions: contact precautions maintained     Problem: Fall Injury Risk  Goal: Absence of Fall and Fall-Related Injury  Outcome: Ongoing - Unchanged  Intervention: Promote Injury-Free Environment  Recent Flowsheet Documentation  Taken 03/31/2024 0800 by Odis Hollingshead, RN  Safety Interventions:   aspiration precautions   enteral feeding safety     Problem: Wound  Goal: Optimal Coping  Outcome: Ongoing - Unchanged  Goal: Optimal Functional Ability  Outcome: Ongoing - Unchanged  Intervention: Optimize Functional Ability  Recent Flowsheet Documentation  Taken 03/31/2024 0800 by Odis Hollingshead, RN  Activity Management: bedrest  Goal: Absence of Infection Signs and Symptoms  Outcome: Ongoing - Unchanged  Intervention: Prevent or Manage Infection  Recent Flowsheet Documentation  Taken 03/31/2024 0800 by Odis Hollingshead, RN  Infection Management: aseptic technique maintained  Isolation Precautions: contact precautions maintained  Goal: Improved Oral Intake  Outcome: Ongoing - Unchanged  Goal: Optimal Pain Control and Function  Outcome: Ongoing - Unchanged  Goal: Skin Health and Integrity  Outcome: Ongoing - Unchanged  Intervention: Optimize Skin Protection  Recent Flowsheet Documentation  Taken  03/31/2024 1800 by Odis Hollingshead, RN  Pressure Reduction Techniques: weight shift assistance provided  Taken 03/31/2024 1600 by Odis Hollingshead, RN  Pressure Reduction Techniques: weight shift assistance provided  Taken 03/31/2024 1400 by Odis Hollingshead, RN  Pressure Reduction Techniques: weight shift assistance provided  Taken 03/31/2024 1200 by Odis Hollingshead, RN  Pressure Reduction Techniques: weight shift assistance provided  Head of Bed [facility]) Positioning: HOB at 30-45 degrees  Taken 03/31/2024 1000 by Odis Hollingshead, RN  Pressure Reduction Techniques: weight shift assistance provided  Taken 03/31/2024 0800 by Odis Hollingshead, RN  Activity Management: bedrest  Pressure Reduction Techniques: weight shift assistance provided  Head of Bed (HOB) Positioning: HOB at 30-45 degrees  Goal: Optimal Wound Healing  Outcome: Ongoing - Unchanged     Problem: Skin Injury Risk Increased  Goal: Skin Health and Integrity  Outcome: Ongoing - Unchanged  Intervention: Optimize Skin Protection  Recent Flowsheet Documentation  Taken 03/31/2024 1800 by Odis Hollingshead, RN  Pressure Reduction Techniques: weight shift assistance provided  Taken 03/31/2024 1600 by Odis Hollingshead, RN  Pressure Reduction Techniques: weight shift assistance provided  Taken 03/31/2024 1400 by Odis Hollingshead, RN  Pressure Reduction Techniques: weight shift assistance provided  Taken 03/31/2024 1200 by Odis Hollingshead, RN  Pressure Reduction Techniques: weight shift assistance provided  Head of Bed [facility]) Positioning: HOB at 30-45 degrees  Taken 03/31/2024 1000 by Odis Hollingshead, RN  Pressure Reduction Techniques: weight shift assistance provided  Taken 03/31/2024 0800 by Odis Hollingshead, RN  Activity Management: bedrest  Pressure Reduction Techniques: weight shift assistance provided  Head of Bed (HOB) Positioning: HOB at 30-45 degrees     Problem: Self-Care Deficit  Goal: Improved Ability to Complete Activities of Daily Living  Outcome: Ongoing - Unchanged     Problem: Malnutrition  Goal: Improved Nutritional Intake  Outcome: Ongoing - Unchanged     Problem: Breathing Pattern Ineffective  Goal: Effective Breathing Pattern  Outcome: Ongoing - Unchanged  Intervention: Promote Improved Breathing Pattern  Recent Flowsheet Documentation  Taken 03/31/2024 1200 by Odis Hollingshead, RN  Head of Bed [facility]) Positioning: HOB at 30-45 degrees  Taken 03/31/2024 0800 by Odis Hollingshead, RN  Head of Bed [facility]) Positioning: HOB at 30-45 degrees

## 2024-03-31 NOTE — Unmapped (Signed)
 Additional order received and the patient is already on the caseload. Continue with plan of care.  See OT note from today for details.

## 2024-03-31 NOTE — Unmapped (Signed)
 Problem: Adult Inpatient Plan of Care  Goal: Plan of Care Review  Outcome: Ongoing - Unchanged  Flowsheets (Taken 03/31/2024 0807)  Progress: improving  Plan of Care Reviewed With:   patient   family     Problem: Fall Injury Risk  Goal: Absence of Fall and Fall-Related Injury  Outcome: Ongoing - Unchanged  Intervention: Promote Scientist, clinical (histocompatibility and immunogenetics) Documentation  Taken 03/30/2024 2000 by Mikal Plane, RN  Safety Interventions:   family at bedside   lighting adjusted for tasks/safety   low bed     Problem: Wound  Goal: Optimal Functional Ability  Outcome: Ongoing - Unchanged  Goal: Absence of Infection Signs and Symptoms  Outcome: Ongoing - Unchanged  Intervention: Prevent or Manage Infection  Recent Flowsheet Documentation  Taken 03/30/2024 2000 by Mikal Plane, RN  Infection Management: aseptic technique maintained  Goal: Improved Oral Intake  Outcome: Ongoing - Unchanged     Problem: Skin Injury Risk Increased  Goal: Skin Health and Integrity  Outcome: Ongoing - Unchanged  Intervention: Optimize Skin Protection  Recent Flowsheet Documentation  Taken 03/30/2024 2000 by Mikal Plane, RN  Pressure Reduction Techniques: weight shift assistance provided  Head of Bed (HOB) Positioning: HOB at 30-45 degrees  Pressure Reduction Devices: foam padding utilized  Skin Protection: tubing/devices free from skin contact     Problem: Self-Care Deficit  Goal: Improved Ability to Complete Activities of Daily Living  Outcome: Ongoing - Unchanged     Problem: Malnutrition  Goal: Improved Nutritional Intake  Outcome: Ongoing - Unchanged     Problem: Wound  Goal: Optimal Pain Control and Function  Outcome: Progressing  Goal: Optimal Wound Healing  Outcome: Progressing     Problem: Breathing Pattern Ineffective  Goal: Effective Breathing Pattern  Outcome: Progressing  Intervention: Promote Improved Breathing Pattern  Recent Flowsheet Documentation  Taken 03/30/2024 2000 by Mikal Plane, RN  Head of Bed [facility]) Positioning: HOB at 30-45 degrees

## 2024-03-31 NOTE — Unmapped (Signed)
 Inpatient Tobacco Cessation Counseling Note    This medical encounter was conducted virtually using [email or web address]  TeleHealth protocols.    I have identified myself to the patient and conveyed my credentials to Tammy Braun  I have explained the capabilities and limitations of telemedicine and the patient/proxy and myself both agree that it is appropriate for their current circumstances/symptoms.     Contact Information  Person Contacted: Tammy Braun         Contact Phone number: [phone]      Phone Outcome: spoke with pt  Is there someone else in the room? No.     Patient's location at the time of the telephone visit: Hospitalized at [facility]   Provider's location at the time of the telephone visit: At home, in [place]      Purpose of contact:     Pt participated in a telephone visit for tobacco cessation counseling.  Patient was admitted to hospital for Schizophrenia, schizo-affective type [F25.9]  SBO (small bowel obstruction) [K56.609]  Right upper quadrant abdominal pain [R10.11]  Nausea and vomiting, unspecified vomiting type [R11.2]. Patient consented to telephone visit given due to social isolation measures in place due to the COVID-19 pandemic.     Tobacco Use History and Assessment  Time Since Last Tobacco Use: more than 1 month ago to 1 year ago  Tobacco Withdrawal (Past 24 Hours): None noted  Type of Tobacco Products Used: Cigarettes  Quantity Used: 10  Quantity Per: day  Previously seen by NDP?: Hospital Inpatient    Behavioral Assessment  Why Uses: 1. habit 2. stress-relief  Reasons to Become Tobacco Free: health  Barriers/Challenges: 1. long-standing habit 2. stress  Strategies: pt can use 1. NRT to manage cravings 2. hand-to-mouth replacements    NOTE: Pt denies having cravings to smoke. Pt states prior to this admission, she would smoke about 10cpd. Pt has been in the hospital for just over 1 month. SW reviewed recommendation for pt to stay quit once she returns home and pt states this is her intention. Pt was difficult to understand at times, and at one point in call pt handed the phone to a staff member who was in the room checking on pt, who reports at times pt has been impulsive and mildly confused. Pt asked when she will be able to go home and states she wants to go home now. SW encouraged her to speak with her care team about the discharge plan and informed pt that from reviewing her chart it seems she will be in the hospital for at least another day. SW explained benefits of pt staying tobacco-free in context of recovering from SBO and in context of pt's COPD and O2 requirement during this admission (pt does not use O2 at baseline). Pt states she does not live with anyone that smokes. Pt declines NRT and ended call abruptly stating she has no further needs. SW provided pt with contact information, physical improvements related to tobacco cessation, and available resources (including outpatient Tobacco Treatment Program at [facility] and [place] [facility]).    Treatment Plan  Please see below for medication recommendations in bold.   Cessation Meds Currently Using: None  Medications Recommended During Hospitalization: Patient declines  Outpatient/Discharge Medications Recommended: Patient declines  Patient's Plan Post Discharge/Visit: Plan to quit as soon as possible    As part of this Telephone Visit, no in-person exam was conducted.     I personally spent 9 minutes counseling the patient via  telephone about tobacco cessation.  I spent an additional 9 minutes on pre- and post-visit activities.      The patient was physically located in [place] or a state in which I am permitted to provide care. The patient and/or parent/guardian understood that s/he may incur co-pays and cost sharing, and agreed to the telemedicine visit. The visit was reasonable and appropriate under the circumstances given the patient's presentation at the time.     The patient and/or parent/guardian has been advised of the potential risks and limitations of this mode of treatment (including, but not limited to, the absence of in-person examination) and has agreed to be treated using telemedicine. The patient's/patient's family's questions regarding telemedicine have been answered.      If the visit was completed in an ambulatory setting, the patient and/or parent/guardian has also been advised to contact their provider???s office for worsening conditions, and seek emergency medical treatment and/or call 911 if the patient deems either necessary.     Visit Format/Coding: Telephone     Coding: 62376 (5-10 minutes)  Service rendered over the phone most consistent with: Tobacco cessation counseling, greater than 3 minutes ([id number])      Tammy Poisson, LCSW, NCTTP  Clinical Social Worker / Tobacco Treatment Specialist  [facility]  Phone: [phone]

## 2024-03-31 NOTE — Unmapped (Signed)
Pt seen for PT session today. Please see note for details.

## 2024-03-31 NOTE — Unmapped (Signed)
 [place] TRAUMA, ACUTE CARE, and GENERAL SURGERY   - Surgery Daily Progress Note -  03/31/2024     Admit Date: 02/26/2024, Hospital Day: 35  Hospital Service: [facility] Pillar [facility])  Attending: Sheralyn Dies, MD  [facility] - SRH-4  General / Trauma Surgery - Trauma/CC     Assessment     Tammy Braun 83 y.o. female, PMH bladder cancer with cystectomy and ileal conduit, COPD, recent dx of afib, moderate-severe TR, CAD (high calcification score), T2DM presented to [facility] with projectile vomiting and abdominal pain, found to have SBO with transition point at level of ileostomy on CT A/P. OR on 02/28/24 for ex-lap, reduction of parastomal hernia, small bowel resection with primary anastomosis and lysis of adhesions.     Interval Events:  NAEO       Plan     Neuro:   - Acute pain:Well controlled  - Tylenol PRN  - at bedtime robaxin (takes flexeril at bedtime at home)    *Insomnia  - Melatonin PRN    *Schizoaffective disorder  - Continue Prolixin monthly (last dose 3/14)      CV:   *Hx HTN   - HDS  - Holding home amlodipine given low/normal pressures     *HFpEF, pulmonary HTN   - ECHO 3/12- EF 70%, severe pulmonary HTN     *Hx of HLD  - Continue lipitor 40mg  daily    *Afib-flutter w/ RVR:  *Hx of paroxysmal Afib  - Was taking diltiazem outpatient; previously held due to likely poor absorption and started on amiodarone in ICU.  - Consulted cardiology 3/28 for amiodarone plan; recommend continuining PO amiodarone 200 mg daily through admission and upon discharge (even with bradycardia, as they feel she is at her baseline rate s/p conversion)  - Amiodarone reduced to 100mg  3/30 per daughter preference and ok per cardiology  - Continue amiodarone 100mg   - Page closer to DC for follow-up. Can convert to DOAC as able. (Transitioning to lovenox as below)      Resp:   *Acute on chronic hypoxemia, hx COPD  *Pulmonary edema  *Pulmonary HTN   - 02 requirement now 2L via [place], Wean supplemental oxygen as able, sat goal > 88%   - Continue pulmonary toliet: OOB and IS   - Continue atrovent q6h, brovana BID   - CXR today: small bil pleural effusions  - Diuresing daily with goal of net -500  - Give Lasix 40 x1 today    *Pulmonary embolism:   - 3/4 CTA chest: filling defect in L anterior segmental artery    - Heparin gtt  transitioned to therapeutic lovenox 3/30  - Plan for DOAC closer to discharge    Fen/GI:   - Diet: Regular diet with food cut up in small pieces. Cal count in place. Supplements ordered.   - Continue Nepro TF per RD recs due to elevated K 3/31 (5.2 today)   - Reduced ensure clear to once daily  - Severe Protein-Calorie Malnutrition in the context of acute illness or injury (03/10/24 0901)  Fat Loss: Moderate  Muscle Loss: Severe  Fluid Accumulation: Moderate  Malnutrition Score: 3  - Fluids: ML   - Zofran as needed for nausea   - Replete lytes prn: None needed today  - Bowel regimen: Miralax, senna, additional BR see below    #Constipation  - No BM since 3/27  - Large stool burden visualized on CXRs  - Had small BM after suppository on 3/29  - SMOG  and mineral oil vix corpak on 3/30 with no results  - SMOG repeated 3/31 with no results  - KUB ordered today 4/1 to eval stool burden  - Will consider repeat CT scan pending KUB    #S/p ex-lap, reduction of parastomal hernia, small bowel resection with primary anastomosis, lysis of adhesions   - WTD dressing changes daily per nursing to midline incision    #Large hiatal hernia, chronic  - re-eval on CT chest/abd/pelvis 3/13 stable    GU:   - Adequate UOP.   - Cr WNL  - Voiding spontaneously--> urostomy     *AKI on CKD (resolved)  - Cr improved  - Received CRRT x2  - Nephrology signed off  - Diuresis as needed    *H/o bladder cancer s/p radical cystectomy with ileal conduit (2012):  *Parastomal hernia:  - Strict I&Os  - Ok for no foley in stoma at this time per urology  - Adequate UOP from stoma    Heme  - Hgb stable    ID:   - Afebrile.  - WBC- No leukocytosis.  - C-Diff negative 3/26    Endocrine: *Hyperglycemia   - NPH 15 units BID   - Regular SSI q6hr   - POCT    PPx:   Therapeutic lovenox     Dispo: Stepdown status  PT: 5XL  OT: 5XL  ST: 5X  Barriers to discharge:  long term feeding plan, O2 requirement, improvement in bowel function, dispo  CM/SW assisting in discharge planning.     Contact: [facility] Stepdown [phone]  [name], PA        Subjective   Reports she is doing well today and expressed interest in eating breakfast. Denied BM.       Objective     Vitals:   Temp:  [36.4 ??C (97.6 ??F)-36.7 ??C (98 ??F)] 36.6 ??C (97.9 ??F)  Heart Rate:  [51-64] 64  SpO2 Pulse:  [48-63] 63  Resp:  [17-23] 20  BP: (100-166)/(41-77) 139/77  MAP (mmHg):  [67-99] 99  FiO2 (%):  [28 %] 28 %  SpO2:  [91 %-100 %] 94 %    Intake/Output last 24 hours:  I/O last 3 completed shifts:  In: 3015 [P.O.:130; NG/GT:2885]  Out: 3500 [Urine:3500]    Physical Exam:    -General:  Appropriate, comfortable and in no apparent distress.   -Neurological: Alert and oriented with appropriate conversation. Moves all 4 extremities spontaneously.   -ENT: Corpak in place with TF infusing  -Psych: speech appropriate, pleasant affect  -Cardiovascular: Regular rate and rhythm  -Pulmonary: Normal work of breathing. No accessory muscle use, currently on 2L via [place]   -Abdomen: Soft, non-distended. Appropriately tender to palpation post- operatively. No rebound or guarding. Midline incision with island dressing place. 2 small openings packed. Fibrinous tissue noted. No purulence.  -Genitourinary: Voiding spontaneously.  Ileal conduit urostomy pink and moist with good UOP.  -Extremities: Warm, well perfused, normal skin turgor. Mild LE edema      Data Review:    I have reviewed the labs and studies from the last 24 hours.    Imaging: Radiology studies were personally reviewed     ATTENDING ATTESTATION:  I was the supervising physician in the delivery of the service.  ______________________________

## 2024-04-01 LAB — CBC
HEMATOCRIT: 27.1 % — ABNORMAL LOW (ref 34.0–44.0)
HEMOGLOBIN: 8.4 g/dL — ABNORMAL LOW (ref 11.3–14.9)
MEAN CORPUSCULAR HEMOGLOBIN CONC: 30.8 g/dL — ABNORMAL LOW (ref 32.0–36.0)
MEAN CORPUSCULAR HEMOGLOBIN: 27.7 pg (ref 25.9–32.4)
MEAN CORPUSCULAR VOLUME: 89.9 fL (ref 77.6–95.7)
MEAN PLATELET VOLUME: 9.3 fL (ref 6.8–10.7)
PLATELET COUNT: 307 10*9/L (ref 150–450)
RED BLOOD CELL COUNT: 3.01 10*12/L — ABNORMAL LOW (ref 3.95–5.13)
RED CELL DISTRIBUTION WIDTH: 21.5 % — ABNORMAL HIGH (ref 12.2–15.2)
WBC ADJUSTED: 8.8 10*9/L (ref 3.6–11.2)

## 2024-04-01 LAB — BASIC METABOLIC PANEL
ANION GAP: 8 mmol/L (ref 5–14)
BLOOD UREA NITROGEN: 54 mg/dL — ABNORMAL HIGH (ref 9–23)
BUN / CREAT RATIO: 56
CALCIUM: 9.8 mg/dL (ref 8.7–10.4)
CHLORIDE: 101 mmol/L (ref 98–107)
CO2: 30 mmol/L (ref 20.0–31.0)
CREATININE: 0.96 mg/dL (ref 0.55–1.02)
EGFR CKD-EPI (2021) FEMALE: 60 mL/min/1.73m2 (ref >=60)
GLUCOSE RANDOM: 149 mg/dL (ref 70–179)
POTASSIUM: 4.8 mmol/L (ref 3.4–4.8)
SODIUM: 139 mmol/L (ref 135–145)

## 2024-04-01 LAB — MAGNESIUM: MAGNESIUM: 2.1 mg/dL (ref 1.6–2.6)

## 2024-04-01 LAB — PHOSPHORUS: PHOSPHORUS: 3.5 mg/dL (ref 2.4–5.1)

## 2024-04-01 MED ADMIN — furosemide (LASIX) injection 20 mg: 20 mg | INTRAVENOUS | @ 13:00:00 | Stop: 2024-04-01

## 2024-04-01 MED ADMIN — insulin NPH (HumuLIN,NovoLIN) injection 15 Units: 15 [IU] | SUBCUTANEOUS | @ 23:00:00

## 2024-04-01 MED ADMIN — arformoterol (BROVANA) nebulizer solution 15 mcg/2 mL: 15 ug | RESPIRATORY_TRACT | @ 14:00:00

## 2024-04-01 MED ADMIN — amiodarone (PACERONE) oral suspension: 150 mg | GASTROENTERAL | @ 13:00:00

## 2024-04-01 MED ADMIN — insulin regular (HumuLIN,NovoLIN) injection 0-20 Units: 0-20 [IU] | SUBCUTANEOUS | @ 17:00:00

## 2024-04-01 MED ADMIN — ipratropium (ATROVENT) 0.02 % nebulizer solution 500 mcg: 500 ug | RESPIRATORY_TRACT | @ 14:00:00

## 2024-04-01 MED ADMIN — arformoterol (BROVANA) nebulizer solution 15 mcg/2 mL: 15 ug | RESPIRATORY_TRACT | @ 01:00:00

## 2024-04-01 MED ADMIN — carboxymethylcellulose sodium (THERATEARS) 0.25 % ophthalmic solution 2 drop: 2 [drp] | OPHTHALMIC | @ 01:00:00

## 2024-04-01 MED ADMIN — ipratropium (ATROVENT) 0.02 % nebulizer solution 500 mcg: 500 ug | RESPIRATORY_TRACT | @ 21:00:00

## 2024-04-01 MED ADMIN — enoxaparin (LOVENOX) syringe 50 mg: 50 mg | SUBCUTANEOUS | @ 13:00:00

## 2024-04-01 MED ADMIN — methocarbamol (ROBAXIN) tablet 500 mg: 500 mg | ORAL | @ 01:00:00

## 2024-04-01 MED ADMIN — insulin NPH (HumuLIN,NovoLIN) injection 15 Units: 15 [IU] | SUBCUTANEOUS | @ 11:00:00

## 2024-04-01 MED ADMIN — polyethylene glycol (MIRALAX) packet 17 g: 17 g | GASTROENTERAL | @ 13:00:00

## 2024-04-01 MED ADMIN — acetaminophen (TYLENOL) tablet 1,000 mg: 1000 mg | GASTROENTERAL | @ 13:00:00

## 2024-04-01 MED ADMIN — insulin regular (HumuLIN,NovoLIN) injection 0-20 Units: 0-20 [IU] | SUBCUTANEOUS | @ 22:00:00

## 2024-04-01 MED ADMIN — acetaminophen (TYLENOL) tablet 1,000 mg: 1000 mg | GASTROENTERAL

## 2024-04-01 MED ADMIN — senna (SENOKOT) tablet 1 tablet: 1 | ORAL | @ 01:00:00

## 2024-04-01 MED ADMIN — enoxaparin (LOVENOX) syringe 50 mg: 50 mg | SUBCUTANEOUS | @ 01:00:00

## 2024-04-01 MED ADMIN — polyethylene glycol (MIRALAX) packet 17 g: 17 g | GASTROENTERAL | @ 01:00:00

## 2024-04-01 MED ADMIN — carboxymethylcellulose sodium (THERATEARS) 0.25 % ophthalmic solution 2 drop: 2 [drp] | OPHTHALMIC | @ 18:00:00

## 2024-04-01 MED ADMIN — ipratropium (ATROVENT) 0.02 % nebulizer solution 500 mcg: 500 ug | RESPIRATORY_TRACT | @ 07:00:00

## 2024-04-01 MED ADMIN — melatonin tablet 4.5 mg: 4.5 mg | GASTROENTERAL

## 2024-04-01 MED ADMIN — ipratropium (ATROVENT) 0.02 % nebulizer solution 500 mcg: 500 ug | RESPIRATORY_TRACT | @ 01:00:00

## 2024-04-01 MED ADMIN — atorvastatin (LIPITOR) tablet 40 mg: 40 mg | GASTROENTERAL | @ 13:00:00

## 2024-04-01 MED ADMIN — melatonin tablet 4.5 mg: 4.5 mg | GASTROENTERAL | @ 01:00:00

## 2024-04-01 NOTE — Unmapped (Signed)
 The Clinical Ethics Service was asked by [facility]) to offer a consultation regarding Tammy Braun.  Dr Collins Scotland met with the team and offered recommendations. Via [employer] today providers relayed to me that the consult matters have been addressed. We will close the follow up at this time.     Note: These recommendations are advisory. They follow from the patient???s status and available information at the time of the consult. Recommendations are offered on behalf of the Clinical Ethics Service supported by the [facility] Ethics Committee.  They are not medical judgments or legal advice. Ethics consultants are not acting in a clinical role when conducting ethics consultations. Involved parties must decide whether and how any advice offered might assist them in relevant decision-making. Our consult service is available without charge to any member of the care team, and to patients or their loved ones, for help in addressing ethical issues that arise in the course of patient care. Please call [phone] to request further assistance. Thank you for this consultation.     Saul Fordyce, Clinical Ethics Service  04/01/2024 1:53 PM

## 2024-04-01 NOTE — Unmapped (Signed)
 Took over pt care from previous RN at 1030. VSS. Pt remains on 1LNC. NGT dc, per order. Pt tolerating diet, nutritional intake encouraged throughout shift. Adequate UO with urostomy. Pt had multiple loose BM throughout shift. Q2 repositioning performed. Pt sat at the edge of bed. Pt remains intermediate status.       Problem: Adult Inpatient Plan of Care  Goal: Plan of Care Review  Outcome: Ongoing - Unchanged  Goal: Patient-Specific Goal (Individualized)  Outcome: Ongoing - Unchanged  Goal: Absence of Hospital-Acquired Illness or Injury  Outcome: Ongoing - Unchanged  Intervention: Identify and Manage Fall Risk  Recent Flowsheet Documentation  Taken 04/01/2024 1200 by Riley Kill, RN  Safety Interventions:   aspiration precautions   bed alarm   fall reduction program maintained   low bed  Intervention: Prevent Skin Injury  Recent Flowsheet Documentation  Taken 04/01/2024 1800 by Riley Kill, RN  Positioning for Skin: Left  Taken 04/01/2024 1600 by Riley Kill, RN  Positioning for Skin: Right  Taken 04/01/2024 1400 by Riley Kill, RN  Positioning for Skin: Left  Taken 04/01/2024 1200 by Riley Kill, RN  Positioning for Skin: Right  Device Skin Pressure Protection: adhesive use limited  Skin Protection: adhesive use limited  Intervention: Prevent and Manage VTE (Venous Thromboembolism) Risk  Recent Flowsheet Documentation  Taken 04/01/2024 1800 by Riley Kill, RN  Anti-Embolism Device Type: SCD, Knee  Anti-Embolism Device Status: On  Anti-Embolism Device Location: BLE  Taken 04/01/2024 1600 by Riley Kill, RN  Anti-Embolism Device Type: SCD, Knee  Anti-Embolism Device Status: On  Anti-Embolism Device Location: BLE  Taken 04/01/2024 1400 by Riley Kill, RN  Anti-Embolism Device Type: SCD, Knee  Anti-Embolism Device Status: On  Anti-Embolism Device Location: BLE  Taken 04/01/2024 1200 by Riley Kill, RN  Anti-Embolism Device Type: SCD, Knee  Anti-Embolism Device Status: On  Anti-Embolism Device Location: BLE  Intervention: Prevent Infection  Recent Flowsheet Documentation  Taken 04/01/2024 1200 by Riley Kill, RN  Infection Prevention: hand hygiene promoted  Goal: Optimal Comfort and Wellbeing  Outcome: Ongoing - Unchanged  Goal: Readiness for Transition of Care  Outcome: Ongoing - Unchanged  Goal: Rounds/Family Conference  Outcome: Ongoing - Unchanged     Problem: Infection  Goal: Absence of Infection Signs and Symptoms  Outcome: Ongoing - Unchanged  Intervention: Prevent or Manage Infection  Recent Flowsheet Documentation  Taken 04/01/2024 1200 by Riley Kill, RN  Infection Management: aseptic technique maintained  Isolation Precautions: contact precautions maintained     Problem: Fall Injury Risk  Goal: Absence of Fall and Fall-Related Injury  Outcome: Ongoing - Unchanged  Intervention: Promote Injury-Free Environment  Recent Flowsheet Documentation  Taken 04/01/2024 1200 by Riley Kill, RN  Safety Interventions:   aspiration precautions   bed alarm   fall reduction program maintained   low bed     Problem: Wound  Goal: Optimal Coping  Outcome: Ongoing - Unchanged  Goal: Optimal Functional Ability  Outcome: Ongoing - Unchanged  Intervention: Optimize Functional Ability  Recent Flowsheet Documentation  Taken 04/01/2024 1600 by Riley Kill, RN  Activity Management: bedrest  Taken 04/01/2024 1200 by Riley Kill, RN  Activity Management: bedrest  Goal: Absence of Infection Signs and Symptoms  Outcome: Ongoing - Unchanged  Intervention: Prevent or Manage Infection  Recent Flowsheet Documentation  Taken 04/01/2024 1200 by Riley Kill, RN  Infection Management: aseptic technique maintained  Isolation Precautions: contact precautions maintained  Goal: Improved Oral Intake  Outcome: Ongoing - Unchanged  Goal: Optimal Pain Control and  Function  Outcome: Ongoing - Unchanged  Goal: Skin Health and Integrity  Outcome: Ongoing - Unchanged  Intervention: Optimize Skin Protection  Recent Flowsheet Documentation  Taken 04/01/2024 1800 by Riley Kill, RN  Head of Bed [facility]) Positioning: HOB at 30-45 degrees  Taken 04/01/2024 1600 by Riley Kill, RN  Activity Management: bedrest  Head of Bed [facility]) Positioning: HOB at 30-45 degrees  Taken 04/01/2024 1400 by Riley Kill, RN  Head of Bed [facility]) Positioning: HOB at 30-45 degrees  Taken 04/01/2024 1200 by Riley Kill, RN  Activity Management: bedrest  Pressure Reduction Techniques: weight shift assistance provided  Head of Bed (HOB) Positioning: HOB at 30-45 degrees  Pressure Reduction Devices: pressure-redistributing mattress utilized  Skin Protection: adhesive use limited  Goal: Optimal Wound Healing  Outcome: Ongoing - Unchanged     Problem: Skin Injury Risk Increased  Goal: Skin Health and Integrity  Outcome: Ongoing - Unchanged  Intervention: Optimize Skin Protection  Recent Flowsheet Documentation  Taken 04/01/2024 1800 by Riley Kill, RN  Head of Bed [facility]) Positioning: HOB at 30-45 degrees  Taken 04/01/2024 1600 by Riley Kill, RN  Activity Management: bedrest  Head of Bed [facility]) Positioning: HOB at 30-45 degrees  Taken 04/01/2024 1400 by Riley Kill, RN  Head of Bed [facility]) Positioning: HOB at 30-45 degrees  Taken 04/01/2024 1200 by Riley Kill, RN  Activity Management: bedrest  Pressure Reduction Techniques: weight shift assistance provided  Head of Bed (HOB) Positioning: HOB at 30-45 degrees  Pressure Reduction Devices: pressure-redistributing mattress utilized  Skin Protection: adhesive use limited     Problem: Self-Care Deficit  Goal: Improved Ability to Complete Activities of Daily Living  Outcome: Ongoing - Unchanged     Problem: Malnutrition  Goal: Improved Nutritional Intake  Outcome: Ongoing - Unchanged     Problem: Breathing Pattern Ineffective  Goal: Effective Breathing Pattern  Outcome: Ongoing - Unchanged  Intervention: Promote Improved Breathing Pattern  Recent Flowsheet Documentation  Taken 04/01/2024 1800 by Riley Kill, RN  Head of Bed [facility]) Positioning: HOB at 30-45 degrees  Taken 04/01/2024 1600 by Riley Kill, RN  Head of Bed [facility]) Positioning: HOB at 30-45 degrees  Taken 04/01/2024 1400 by Riley Kill, RN  Head of Bed [facility]) Positioning: HOB at 30-45 degrees  Taken 04/01/2024 1200 by Riley Kill, RN  Head of Bed [facility]) Positioning: HOB at 30-45 degrees

## 2024-04-01 NOTE — Unmapped (Signed)
 Pt is alert and oriented x2-3. Cooperative with care and compliant with medications. Made her needs known. Voiced no concerns/complaints other than pain/insomnia. Scheduled pain and prn insomnia medications were provided and they were effective. Patient is dependent with ADLs.     Problem: Adult Inpatient Plan of Care  Goal: Plan of Care Review  Outcome: Progressing  Goal: Patient-Specific Goal (Individualized)  Outcome: Progressing  Goal: Absence of Hospital-Acquired Illness or Injury  Outcome: Progressing  Goal: Optimal Comfort and Wellbeing  Outcome: Progressing  Goal: Readiness for Transition of Care  Outcome: Progressing  Goal: Rounds/Family Conference  Outcome: Progressing     Problem: Infection  Goal: Absence of Infection Signs and Symptoms  Outcome: Progressing     Problem: Fall Injury Risk  Goal: Absence of Fall and Fall-Related Injury  Outcome: Progressing     Problem: Wound  Goal: Optimal Coping  Outcome: Progressing  Goal: Optimal Functional Ability  Outcome: Progressing  Goal: Absence of Infection Signs and Symptoms  Outcome: Progressing  Goal: Improved Oral Intake  Outcome: Progressing  Goal: Optimal Pain Control and Function  Outcome: Progressing  Goal: Skin Health and Integrity  Outcome: Progressing  Goal: Optimal Wound Healing  Outcome: Progressing     Problem: Skin Injury Risk Increased  Goal: Skin Health and Integrity  Outcome: Progressing     Problem: Self-Care Deficit  Goal: Improved Ability to Complete Activities of Daily Living  Outcome: Progressing     Problem: Malnutrition  Goal: Improved Nutritional Intake  Outcome: Progressing     Problem: Breathing Pattern Ineffective  Goal: Effective Breathing Pattern  Outcome: Progressing

## 2024-04-01 NOTE — Unmapped (Signed)
 [place] TRAUMA, ACUTE CARE, and GENERAL SURGERY   - Surgery Daily Progress Note -  04/01/2024     Admit Date: 02/26/2024, Hospital Day: 60  Hospital Service: [facility] Pillar [facility])  Attending: Sheralyn Dies, MD  [facility] - SRH-4  General / Trauma Surgery - Trauma/CC     Assessment     Tammy Braun 83 y.o. female, PMH bladder cancer with cystectomy and ileal conduit, COPD, recent dx of afib, moderate-severe TR, CAD (high calcification score), T2DM presented to [facility] with projectile vomiting and abdominal pain, found to have SBO with transition point at level of ileostomy on CT A/P. OR on 02/28/24 for ex-lap, reduction of parastomal hernia, small bowel resection with primary anastomosis and lysis of adhesions.     Interval Events:  NAEO       Plan     Neuro:   - Acute pain:Well controlled  - Tylenol PRN  - at bedtime robaxin (takes flexeril at bedtime at home)    *Insomnia  - Melatonin PRN    *Schizoaffective disorder  - Continue Prolixin monthly (last dose 3/14)      CV:   *Hx HTN   - HDS  - Holding home amlodipine given low/normal pressures     *HFpEF, pulmonary HTN   - ECHO 3/12- EF 70%, severe pulmonary HTN     *Hx of HLD  - Continue lipitor 40mg  daily    *Afib-flutter w/ RVR:  *Hx of paroxysmal Afib  - Was taking diltiazem outpatient; previously held due to likely poor absorption and started on amiodarone in ICU.  - Consulted cardiology 3/28 for amiodarone plan; recommend continuining PO amiodarone 200 mg daily through admission and upon discharge (even with bradycardia, as they feel she is at her baseline rate s/p conversion)  - Amiodarone reduced to 100mg  3/30 per daughter preference and ok per cardiology  - Amiodarone increased to 150mg  4/1 due to elevated HR  - Page closer to DC for follow-up. Can convert to DOAC as able. (Transitioned to lovenox as below)      Resp:   *Acute on chronic hypoxemia, hx COPD  *Pulmonary edema  *Pulmonary HTN   - 02 requirement now 1L via [place], Wean supplemental oxygen as able, sat goal > 88%   - Continue pulmonary toliet: OOB and IS   - Continue atrovent q6h, brovana BID   - CXR today pending  - Diuresing daily with goal of net -500 to -1L  - Give Lasix 20 x1 today; will re-evaluate this afternoon and give additional lasix if needed    *Pulmonary embolism:   - 3/4 CTA chest: filling defect in L anterior segmental artery    - Heparin gtt  transitioned to therapeutic lovenox 3/30  - Plan for DOAC closer to discharge    Fen/GI:   - Diet: Regular diet with food cut up in small pieces. Cal count in place. Supplements ordered.   - Continue Nepro TF per RD recs due to elevated K 3/31 (4.8 today)   - Reduced ensure clear to once daily  - Severe Protein-Calorie Malnutrition in the context of acute illness or injury (03/10/24 0901)  Fat Loss: Moderate  Muscle Loss: Severe  Fluid Accumulation: Moderate  Malnutrition Score: 3  - Fluids: ML   - Zofran as needed for nausea   - Replete lytes prn: None needed today  - Bowel regimen: Miralax, senna    #Constipation (resolved)  - Large stool burden visualized on CXRs  - Had small BM after  suppository on 3/29  - SMOG and mineral oil vix corpak on 3/30 with no results  - SMOG repeated 3/31 with no results  - KUB 4/1 showed large amount of stool in colon and rectum  - Patient had multiple large BM 4/1  - Continue daily bowel regimen      #S/p ex-lap, reduction of parastomal hernia, small bowel resection with primary anastomosis, lysis of adhesions   - WTD dressing changes daily per nursing to midline incision    #Large hiatal hernia, chronic  - re-eval on CT chest/abd/pelvis 3/13 stable    GU:   - Adequate UOP.   - Cr WNL  - Voiding spontaneously--> urostomy     *AKI on CKD (resolved)  - Cr improved  - Received CRRT x2  - Nephrology signed off  - Diuresis as needed    *H/o bladder cancer s/p radical cystectomy with ileal conduit (2012):  *Parastomal hernia:  - Strict I&Os  - Ok for no foley in stoma at this time per urology  - Adequate UOP from stoma    Heme  - Hgb stable    ID:   - Afebrile.  - WBC- No leukocytosis.  - C-Diff negative 3/26    Endocrine:   *Hyperglycemia   - NPH 15 units BID   - Regular SSI q6hr   - POCT    PPx:   Therapeutic lovenox     Dispo: Stepdown status  PT: 5XL  OT: 5XL  ST: 5X  Barriers to discharge:  long term feeding plan, O2 requirement, improvement in bowel function, dispo  CM/SW assisting in discharge planning.     Contact: [facility] Stepdown [phone]  [name], PA        Subjective   Reports multiple large BM yesterday. No other complaints this morning.      Objective     Vitals:   Temp:  [36.6 ??C (97.9 ??F)-36.9 ??C (98.5 ??F)] 36.9 ??C (98.5 ??F)  Heart Rate:  [64-125] 101  SpO2 Pulse:  [63-107] 105  Resp:  [16-28] 19  BP: (92-142)/(58-87) 119/71  MAP (mmHg):  [68-99] 87  SpO2:  [94 %-100 %] 95 %    Intake/Output last 24 hours:  I/O last 3 completed shifts:  In: 1365 [P.O.:360; NG/GT:1005]  Out: 2775 [Urine:2775]    Physical Exam:    -General:  Appropriate, comfortable and in no apparent distress.   -Neurological: Alert and oriented with appropriate conversation. Moves all 4 extremities spontaneously.   -ENT: Corpak in place with TF infusing  -Psych: speech appropriate, pleasant affect  -Cardiovascular: Regular rate and rhythm  -Pulmonary: Normal work of breathing. No accessory muscle use, currently on 1L via [place]   -Abdomen: Soft, non-distended. Appropriately tender to palpation post- operatively. No rebound or guarding. Midline incision with island dressing place. 2 small openings packed.  No purulence.  -Genitourinary: Voiding spontaneously.  Ileal conduit urostomy pink and moist with good UOP.  -Extremities: Warm, well perfused, normal skin turgor. Mild LE edema      Data Review:    I have reviewed the labs and studies from the last 24 hours.    Imaging: Radiology studies were personally reviewed       ATTENDING ATTESTATION:  I was the supervising physician in the delivery of the service.  ______________________________

## 2024-04-02 LAB — BASIC METABOLIC PANEL
ANION GAP: 6 mmol/L (ref 5–14)
BLOOD UREA NITROGEN: 50 mg/dL — ABNORMAL HIGH (ref 9–23)
BUN / CREAT RATIO: 48
CALCIUM: 9.3 mg/dL (ref 8.7–10.4)
CHLORIDE: 102 mmol/L (ref 98–107)
CO2: 29 mmol/L (ref 20.0–31.0)
CREATININE: 1.04 mg/dL — ABNORMAL HIGH (ref 0.55–1.02)
EGFR CKD-EPI (2021) FEMALE: 54 mL/min/1.73m2 — ABNORMAL LOW (ref >=60)
GLUCOSE RANDOM: 59 mg/dL — ABNORMAL LOW (ref 70–179)
POTASSIUM: 4.7 mmol/L (ref 3.4–4.8)
SODIUM: 137 mmol/L (ref 135–145)

## 2024-04-02 LAB — CBC
HEMATOCRIT: 24.5 % — ABNORMAL LOW (ref 34.0–44.0)
HEMOGLOBIN: 7.7 g/dL — ABNORMAL LOW (ref 11.3–14.9)
MEAN CORPUSCULAR HEMOGLOBIN CONC: 31.7 g/dL — ABNORMAL LOW (ref 32.0–36.0)
MEAN CORPUSCULAR HEMOGLOBIN: 28.6 pg (ref 25.9–32.4)
MEAN CORPUSCULAR VOLUME: 90.4 fL (ref 77.6–95.7)
MEAN PLATELET VOLUME: 9.5 fL (ref 6.8–10.7)
PLATELET COUNT: 258 10*9/L (ref 150–450)
RED BLOOD CELL COUNT: 2.71 10*12/L — ABNORMAL LOW (ref 3.95–5.13)
RED CELL DISTRIBUTION WIDTH: 22.8 % — ABNORMAL HIGH (ref 12.2–15.2)
WBC ADJUSTED: 7.9 10*9/L (ref 3.6–11.2)

## 2024-04-02 LAB — MAGNESIUM: MAGNESIUM: 2 mg/dL (ref 1.6–2.6)

## 2024-04-02 LAB — PHOSPHORUS: PHOSPHORUS: 4 mg/dL (ref 2.4–5.1)

## 2024-04-02 MED ADMIN — polyethylene glycol (MIRALAX) packet 17 g: 17 g | GASTROENTERAL | @ 12:00:00

## 2024-04-02 MED ADMIN — acetaminophen (TYLENOL) tablet 1,000 mg: 1000 mg | GASTROENTERAL | @ 16:00:00

## 2024-04-02 MED ADMIN — arformoterol (BROVANA) nebulizer solution 15 mcg/2 mL: 15 ug | RESPIRATORY_TRACT | @ 13:00:00

## 2024-04-02 MED ADMIN — dextrose 5 % infusion: 75 mL/h | INTRAVENOUS | @ 12:00:00 | Stop: 2024-04-02

## 2024-04-02 MED ADMIN — atorvastatin (LIPITOR) tablet 40 mg: 40 mg | GASTROENTERAL | @ 12:00:00

## 2024-04-02 MED ADMIN — enoxaparin (LOVENOX) syringe 50 mg: 50 mg | SUBCUTANEOUS | @ 02:00:00

## 2024-04-02 MED ADMIN — ipratropium (ATROVENT) 0.02 % nebulizer solution 500 mcg: 500 ug | RESPIRATORY_TRACT | @ 02:00:00

## 2024-04-02 MED ADMIN — ipratropium (ATROVENT) 0.02 % nebulizer solution 500 mcg: 500 ug | RESPIRATORY_TRACT | @ 14:00:00

## 2024-04-02 MED ADMIN — enoxaparin (LOVENOX) syringe 50 mg: 50 mg | SUBCUTANEOUS | @ 12:00:00

## 2024-04-02 MED ADMIN — acetaminophen (TYLENOL) tablet 1,000 mg: 1000 mg | GASTROENTERAL | @ 04:00:00

## 2024-04-02 MED ADMIN — arformoterol (BROVANA) nebulizer solution 15 mcg/2 mL: 15 ug | RESPIRATORY_TRACT | @ 02:00:00

## 2024-04-02 MED ADMIN — ipratropium (ATROVENT) 0.02 % nebulizer solution 500 mcg: 500 ug | RESPIRATORY_TRACT | @ 09:00:00

## 2024-04-02 MED ADMIN — amiodarone (PACERONE) oral suspension: 150 mg | GASTROENTERAL | @ 12:00:00

## 2024-04-02 MED ADMIN — acetaminophen (TYLENOL) tablet 1,000 mg: 1000 mg | GASTROENTERAL | @ 11:00:00

## 2024-04-02 MED ADMIN — ipratropium (ATROVENT) 0.02 % nebulizer solution 500 mcg: 500 ug | RESPIRATORY_TRACT | @ 21:00:00

## 2024-04-02 MED ADMIN — cholecalciferol (vitamin D3) 10 mcg/mL (400 unit/mL) oral solution: 50 ug | GASTROENTERAL | @ 12:00:00

## 2024-04-02 MED ADMIN — methocarbamol (ROBAXIN) tablet 500 mg: 500 mg | ORAL | @ 02:00:00

## 2024-04-02 MED ADMIN — carboxymethylcellulose sodium (THERATEARS) 0.25 % ophthalmic solution 2 drop: 2 [drp] | OPHTHALMIC | @ 12:00:00

## 2024-04-02 NOTE — Unmapped (Signed)
 A&Ox4. Vital signs within ordered parameters. Remains on 1 L [place]. Voiding adequately via urostomy. Incontinent of bowel; two BMs today. Poor PO intake. Small bites and sips for each meal. Wound care/dressing change performed to abdominal incisions and sacrum. Glucose monitored; remained stable throughout shift. No reports of pain. No acute events. Daughter at bedside. Patient and family updated in plan of care. Stepdown status.    Problem: Adult Inpatient Plan of Care  Goal: Plan of Care Review  Outcome: Ongoing - Unchanged  Goal: Patient-Specific Goal (Individualized)  Outcome: Ongoing - Unchanged  Goal: Absence of Hospital-Acquired Illness or Injury  Outcome: Ongoing - Unchanged  Intervention: Identify and Manage Fall Risk  Recent Flowsheet Documentation  Taken 04/02/2024 0800 by Kavin Leech, RN  Safety Interventions:   bed alarm   environmental modification   family at bedside   fall reduction program maintained   infection management   low bed   commode/urinal/bedpan at bedside  Intervention: Prevent Skin Injury  Recent Flowsheet Documentation  Taken 04/02/2024 1600 by Kavin Leech, RN  Positioning for Skin: Left  Taken 04/02/2024 1400 by Kavin Leech, RN  Positioning for Skin: Right  Taken 04/02/2024 1200 by Kavin Leech, RN  Positioning for Skin:   Bed in Chair   Left  Taken 04/02/2024 1000 by Kavin Leech, RN  Positioning for Skin: Right  Taken 04/02/2024 0800 by Kavin Leech, RN  Positioning for Skin: Left  Device Skin Pressure Protection:   absorbent pad utilized/changed   adhesive use limited   tubing/devices free from skin contact  Skin Protection:   adhesive use limited   incontinence pads utilized   silicone foam dressing in place   tubing/devices free from skin contact  Intervention: Prevent and Manage VTE (Venous Thromboembolism) Risk  Recent Flowsheet Documentation  Taken 04/02/2024 1600 by Kavin Leech, RN  Anti-Embolism Device Type: SCD, Knee  Anti-Embolism Device Status: On  Anti-Embolism Device Location: BLE  Taken 04/02/2024 1400 by Kavin Leech, RN  Anti-Embolism Device Type: SCD, Knee  Anti-Embolism Device Status: On  Anti-Embolism Device Location: BLE  Taken 04/02/2024 1200 by Kavin Leech, RN  Anti-Embolism Device Type: SCD, Knee  Anti-Embolism Device Status: On  Anti-Embolism Device Location: BLE  Taken 04/02/2024 1000 by Kavin Leech, RN  Anti-Embolism Device Type: SCD, Knee  Anti-Embolism Device Status: On  Anti-Embolism Device Location: BLE  Taken 04/02/2024 0800 by Kavin Leech, RN  Anti-Embolism Device Type: SCD, Knee  Anti-Embolism Device Status: On  Anti-Embolism Device Location: BLE  Intervention: Prevent Infection  Recent Flowsheet Documentation  Taken 04/02/2024 0800 by Kavin Leech, RN  Infection Prevention:   cohorting utilized   environmental surveillance performed   equipment surfaces disinfected   hand hygiene promoted   personal protective equipment utilized   rest/sleep promoted   single patient room provided  Goal: Optimal Comfort and Wellbeing  Outcome: Ongoing - Unchanged  Goal: Readiness for Transition of Care  Outcome: Ongoing - Unchanged  Goal: Rounds/Family Conference  Outcome: Ongoing - Unchanged     Problem: Infection  Goal: Absence of Infection Signs and Symptoms  Outcome: Ongoing - Unchanged  Intervention: Prevent or Manage Infection  Recent Flowsheet Documentation  Taken 04/02/2024 0800 by Kavin Leech, RN  Infection Management: aseptic technique maintained  Isolation Precautions: contact precautions maintained     Problem: Fall Injury Risk  Goal: Absence of Fall and Fall-Related Injury  Outcome: Ongoing - Unchanged  Intervention: Promote Injury-Free Environment  Recent Flowsheet Documentation  Taken 04/02/2024 0800 by Kavin Leech, RN  Safety Interventions:   bed alarm   environmental modification   family at bedside   fall reduction program maintained infection management   low bed   commode/urinal/bedpan at bedside     Problem: Wound  Goal: Optimal Coping  Outcome: Ongoing - Unchanged  Goal: Optimal Functional Ability  Outcome: Ongoing - Unchanged  Intervention: Optimize Functional Ability  Recent Flowsheet Documentation  Taken 04/02/2024 1600 by Kavin Leech, RN  Activity Management: bedrest  Taken 04/02/2024 1400 by Kavin Leech, RN  Activity Management: bedrest  Taken 04/02/2024 1200 by Kavin Leech, RN  Activity Management: bedrest  Taken 04/02/2024 1000 by Kavin Leech, RN  Activity Management:   sitting, edge of bed   bedrest  Taken 04/02/2024 0800 by Kavin Leech, RN  Activity Management:   bedrest   dorsiflexion/plantar flexion performed  Goal: Absence of Infection Signs and Symptoms  Outcome: Ongoing - Unchanged  Intervention: Prevent or Manage Infection  Recent Flowsheet Documentation  Taken 04/02/2024 0800 by Kavin Leech, RN  Infection Management: aseptic technique maintained  Isolation Precautions: contact precautions maintained  Goal: Improved Oral Intake  Outcome: Ongoing - Unchanged  Goal: Optimal Pain Control and Function  Outcome: Ongoing - Unchanged  Intervention: Prevent or Manage Pain  Recent Flowsheet Documentation  Taken 04/02/2024 0800 by Kavin Leech, RN  Sleep/Rest Enhancement: consistent schedule promoted  Goal: Skin Health and Integrity  Outcome: Ongoing - Unchanged  Intervention: Optimize Skin Protection  Recent Flowsheet Documentation  Taken 04/02/2024 1600 by Kavin Leech, RN  Activity Management: bedrest  Taken 04/02/2024 1400 by Kavin Leech, RN  Activity Management: bedrest  Head of Bed [facility]) Positioning: HOB at 30-45 degrees  Taken 04/02/2024 1200 by Kavin Leech, RN  Activity Management: bedrest  Head of Bed [facility]) Positioning: HOB at 30-45 degrees  Taken 04/02/2024 1000 by Kavin Leech, RN  Activity Management:   sitting, edge of bed bedrest  Head of Bed [facility]) Positioning: HOB at 30-45 degrees  Taken 04/02/2024 0800 by Kavin Leech, RN  Activity Management:   bedrest   dorsiflexion/plantar flexion performed  Pressure Reduction Techniques:   frequent weight shift encouraged   heels elevated off bed   pressure points protected   weight shift assistance provided  Head of Bed (HOB) Positioning: HOB at 30-45 degrees  Pressure Reduction Devices: pressure-redistributing mattress utilized  Skin Protection:   adhesive use limited   incontinence pads utilized   silicone foam dressing in place   tubing/devices free from skin contact  Goal: Optimal Wound Healing  Outcome: Ongoing - Unchanged  Intervention: Promote Wound Healing  Recent Flowsheet Documentation  Taken 04/02/2024 0800 by Kavin Leech, RN  Sleep/Rest Enhancement: consistent schedule promoted     Problem: Skin Injury Risk Increased  Goal: Skin Health and Integrity  Outcome: Ongoing - Unchanged  Intervention: Optimize Skin Protection  Recent Flowsheet Documentation  Taken 04/02/2024 1600 by Kavin Leech, RN  Activity Management: bedrest  Taken 04/02/2024 1400 by Kavin Leech, RN  Activity Management: bedrest  Head of Bed [facility]) Positioning: HOB at 30-45 degrees  Taken 04/02/2024 1200 by Kavin Leech, RN  Activity Management: bedrest  Head of Bed [facility]) Positioning: HOB at 30-45 degrees  Taken 04/02/2024 1000 by Kavin Leech, RN  Activity Management:   sitting, edge of bed   bedrest  Head of Bed (HOB) Positioning: HOB at  30-45 degrees  Taken 04/02/2024 0800 by Kavin Leech, RN  Activity Management:   bedrest   dorsiflexion/plantar flexion performed  Pressure Reduction Techniques:   frequent weight shift encouraged   heels elevated off bed   pressure points protected   weight shift assistance provided  Head of Bed (HOB) Positioning: HOB at 30-45 degrees  Pressure Reduction Devices: pressure-redistributing mattress utilized  Skin Protection: adhesive use limited   incontinence pads utilized   silicone foam dressing in place   tubing/devices free from skin contact     Problem: Self-Care Deficit  Goal: Improved Ability to Complete Activities of Daily Living  Outcome: Ongoing - Unchanged     Problem: Malnutrition  Goal: Improved Nutritional Intake  Outcome: Ongoing - Unchanged     Problem: Breathing Pattern Ineffective  Goal: Effective Breathing Pattern  Outcome: Ongoing - Unchanged  Intervention: Promote Improved Breathing Pattern  Recent Flowsheet Documentation  Taken 04/02/2024 1400 by Kavin Leech, RN  Head of Bed [facility]) Positioning: HOB at 30-45 degrees  Taken 04/02/2024 1200 by Kavin Leech, RN  Head of Bed [facility]) Positioning: HOB at 30-45 degrees  Taken 04/02/2024 1000 by Kavin Leech, RN  Head of Bed [facility]) Positioning: HOB at 30-45 degrees  Taken 04/02/2024 0800 by Kavin Leech, RN  Head of Bed [facility]) Positioning: HOB at 30-45 degrees

## 2024-04-02 NOTE — Unmapped (Signed)
 [name], ACUTE CARE, and GENERAL SURGERY   - Surgery Daily Progress Note -  04/02/2024     Admit Date: 02/26/2024, Hospital Day: 37  Hospital Service: [facility])  Attending: Sheralyn Dies, MD  [facility] - [facility]  General / Trauma Surgery - Trauma/CC     Assessment     Tammy Braun 83 y.o. female, PMH bladder cancer with cystectomy and ileal conduit, COPD, recent dx of afib, moderate-severe TR, CAD (high calcification score), T2DM presented to [facility] with projectile vomiting and abdominal pain, found to have SBO with transition point at level of ileostomy on CT A/P. OR on 02/28/24 for ex-lap, reduction of parastomal hernia, small bowel resection with primary anastomosis and lysis of adhesions.     Interval Events:  NAEO       Plan     Neuro:   - Acute pain:Well controlled  - Tylenol PRN  - at bedtime robaxin (takes flexeril at bedtime at home)    *Insomnia  - Melatonin PRN    *Schizoaffective disorder  - Continue Prolixin monthly (last dose 3/14)      CV:   *Hx HTN   - HDS  - Holding home amlodipine given low/normal pressures     *HFpEF, pulmonary HTN   - ECHO 3/12- EF 70%, severe pulmonary HTN     *Hx of HLD  - Continue lipitor 40mg  daily    *Afib-flutter w/ RVR:  *Hx of paroxysmal Afib  - Was taking diltiazem outpatient; previously held due to likely poor absorption and started on amiodarone in ICU.  - Consulted cardiology 3/28 for amiodarone plan; recommend continuining PO amiodarone 200 mg daily through admission and upon discharge (even with bradycardia, as they feel she is at her baseline rate s/p conversion)  - Amiodarone reduced to 100mg  3/30 per daughter preference and ok per cardiology  - Amiodarone increased to 150mg  4/1 due to elevated HR  - Page closer to DC for follow-up. Can convert to DOAC as able. (Transitioned to lovenox as below)      Resp:   *Acute on chronic hypoxemia, hx COPD  *Pulmonary edema  *Pulmonary HTN   - 02 requirement now 1L via [place], Wean supplemental oxygen as able, sat goal > 88%   - Continue pulmonary toliet: OOB and IS   - Continue atrovent q6h, brovana BID   - CXR 4/2 showed improved pleural effusions  - CXR today showed bibasilar atelectasis   - Diuresing prn daily with goal of net -500 to -1L  - Holding on lasix today given hypoglycemia and soft pressures. O2 requirement has improved as well.    *Pulmonary embolism:   - 3/4 CTA chest: filling defect in L anterior segmental artery    - Heparin gtt  transitioned to therapeutic lovenox 3/30  - Plan for DOAC closer to discharge    Fen/GI:   - Diet: Regular diet with food cut up in small pieces. Cal count in place. Supplements ordered.   - NGT removed 4/2 as patient was meeting calorie and feeding goals.   - Patient aware NGT may be replaced if unable to meet nutrition goals  - Reduced ensure clear to once daily  - Severe Protein-Calorie Malnutrition in the context of acute illness or injury (03/10/24 0901)  Fat Loss: Moderate  Muscle Loss: Severe  Fluid Accumulation: Moderate  Malnutrition Score: 3  - Fluids: ML   - Zofran as needed for nausea   - Replete lytes prn: None needed today  -  Bowel regimen: Miralax , senna    #Constipation (resolved)  - Large stool burden visualized on CXRs  - Had small BM after suppository on 3/29  - SMOG and mineral oil vix corpak on 3/30 with no results  - SMOG repeated 3/31 with no results  - KUB 4/1 showed large amount of stool in colon and rectum  - Patient had multiple large BM 4/1  - Continue daily bowel regimen      #S/p ex-lap, reduction of parastomal hernia, small bowel resection with primary anastomosis, lysis of adhesions   - WTD dressing changes daily per nursing to midline incision    #Large hiatal hernia, chronic  - re-eval on CT chest/abd/pelvis 3/13 stable    GU:   - Adequate UOP.   - Cr WNL  - Voiding spontaneously--> urostomy     *AKI on CKD (resolved)  - Cr improved  - Received CRRT x2  - Nephrology signed off  - Diuresis as needed    *H/o bladder cancer s/p radical cystectomy with ileal conduit (2012):  *Parastomal hernia:  - Strict I&Os  - Ok for no foley in stoma at this time per urology  - Adequate UOP from stoma    Heme  - Hgb stable    ID:   - Afebrile.  - WBC- No leukocytosis.  - C-Diff negative 3/26    Endocrine:   *Hyperglycemia   - NPH 15 units BID   - Regular SSI q6hr   - POCT increased to q4  - Hypoglycemia today 4/3; BG improved after juice and insulin  held until BG improves.     PPx:   Therapeutic lovenox     Dispo: Stepdown status  PT: 5XL  OT: 5XL  ST: 5X  Barriers to discharge:  long term feeding plan, O2 requirement, dispo  CM/SW assisting in discharge planning.     Contact: [facility] Stepdown [phone]  [name], PA        Subjective   Patient states she is having leg pain. No decreased sensation or AROM. No edema or erythema.      Objective     Vitals:   Temp:  [36.4 ??C (97.6 ??F)-36.8 ??C (98.2 ??F)] 36.7 ??C (98 ??F)  Heart Rate:  [54-108] 54  SpO2 Pulse:  [54-113] 59  Resp:  [18-25] 22  BP: (97-142)/(50-74) 115/50  MAP (mmHg):  [64-92] 75  FiO2 (%):  [24 %] 24 %  SpO2:  [93 %-99 %] 99 %    Intake/Output last 24 hours:  I/O last 3 completed shifts:  In: 215.3 [I.V.:35.3; NG/GT:180]  Out: 2000 [Urine:2000]    Physical Exam:    -General:  Appropriate, comfortable and in no apparent distress.   -Neurological: Alert and oriented with appropriate conversation. Moves all 4 extremities spontaneously.   -ENT: EOMI  -Psych: speech appropriate, pleasant affect  -Cardiovascular: Regular rate and rhythm  -Pulmonary: Normal work of breathing. No accessory muscle use, currently on 1L via [place]   -Abdomen: Soft, non-distended. Appropriately tender to palpation post- operatively. No rebound or guarding. Midline incision with island dressing place. 2 small openings packed.  No purulence.  -Genitourinary: Voiding spontaneously.  Ileal conduit urostomy pink and moist with good UOP.  -Extremities: Warm, well perfused, normal skin turgor.       Data Review:    I have reviewed the labs and studies from the last 24 hours.    Imaging: Radiology studies were personally reviewed       ATTENDING ATTESTATION:  I  was the supervising physician in the delivery of the service.  ______________________________

## 2024-04-02 NOTE — Unmapped (Signed)
 Problem: Adult Inpatient Plan of Care  Goal: Plan of Care Review  Outcome: Ongoing - Unchanged  Goal: Patient-Specific Goal (Individualized)  Outcome: Ongoing - Unchanged  Goal: Absence of Hospital-Acquired Illness or Injury  Outcome: Ongoing - Unchanged  Intervention: Identify and Manage Fall Risk  Recent Flowsheet Documentation  Taken 04/01/2024 2000 by Jule Ser, RN  Safety Interventions: bed alarm  Intervention: Prevent Skin Injury  Recent Flowsheet Documentation  Taken 04/01/2024 2000 by Jule Ser, RN  Positioning for Skin: Right  Device Skin Pressure Protection: adhesive use limited  Skin Protection: adhesive use limited  Intervention: Prevent Infection  Recent Flowsheet Documentation  Taken 04/01/2024 2000 by Jule Ser, RN  Infection Prevention: hand hygiene promoted  Goal: Optimal Comfort and Wellbeing  Outcome: Ongoing - Unchanged  Goal: Readiness for Transition of Care  Outcome: Ongoing - Unchanged  Goal: Rounds/Family Conference  Outcome: Ongoing - Unchanged     Problem: Infection  Goal: Absence of Infection Signs and Symptoms  Outcome: Ongoing - Unchanged  Intervention: Prevent or Manage Infection  Recent Flowsheet Documentation  Taken 04/01/2024 2000 by Jule Ser, RN  Infection Management: aseptic technique maintained     Problem: Fall Injury Risk  Goal: Absence of Fall and Fall-Related Injury  Outcome: Ongoing - Unchanged  Intervention: Promote Injury-Free Environment  Recent Flowsheet Documentation  Taken 04/01/2024 2000 by Jule Ser, RN  Safety Interventions: bed alarm     Problem: Wound  Goal: Optimal Coping  Outcome: Ongoing - Unchanged  Goal: Optimal Functional Ability  Outcome: Ongoing - Unchanged  Intervention: Optimize Functional Ability  Recent Flowsheet Documentation  Taken 04/01/2024 2000 by Jule Ser, RN  Activity Management: bedrest  Goal: Absence of Infection Signs and Symptoms  Outcome: Ongoing - Unchanged  Intervention: Prevent or Manage Infection  Recent Flowsheet Documentation  Taken 04/01/2024 2000 by Jule Ser, RN  Infection Management: aseptic technique maintained  Goal: Improved Oral Intake  Outcome: Ongoing - Unchanged  Goal: Optimal Pain Control and Function  Outcome: Ongoing - Unchanged  Goal: Skin Health and Integrity  Outcome: Ongoing - Unchanged  Intervention: Optimize Skin Protection  Recent Flowsheet Documentation  Taken 04/01/2024 2000 by Jule Ser, RN  Activity Management: bedrest  Pressure Reduction Techniques: frequent weight shift encouraged  Head of Bed (HOB) Positioning: HOB at 30-45 degrees  Pressure Reduction Devices: pressure-redistributing mattress utilized  Skin Protection: adhesive use limited  Goal: Optimal Wound Healing  Outcome: Ongoing - Unchanged     Problem: Skin Injury Risk Increased  Goal: Skin Health and Integrity  Outcome: Ongoing - Unchanged  Intervention: Optimize Skin Protection  Recent Flowsheet Documentation  Taken 04/01/2024 2000 by Jule Ser, RN  Activity Management: bedrest  Pressure Reduction Techniques: frequent weight shift encouraged  Head of Bed (HOB) Positioning: HOB at 30-45 degrees  Pressure Reduction Devices: pressure-redistributing mattress utilized  Skin Protection: adhesive use limited     Problem: Self-Care Deficit  Goal: Improved Ability to Complete Activities of Daily Living  Outcome: Ongoing - Unchanged     Problem: Malnutrition  Goal: Improved Nutritional Intake  Outcome: Ongoing - Unchanged     Problem: Breathing Pattern Ineffective  Goal: Effective Breathing Pattern  Outcome: Ongoing - Unchanged  Intervention: Promote Improved Breathing Pattern  Recent Flowsheet Documentation  Taken 04/01/2024 2000 by Jule Ser, RN  Head of Bed [facility]) Positioning: HOB at 30-45 degrees

## 2024-04-03 LAB — BASIC METABOLIC PANEL
ANION GAP: 9 mmol/L (ref 5–14)
BLOOD UREA NITROGEN: 47 mg/dL — ABNORMAL HIGH (ref 9–23)
BUN / CREAT RATIO: 43
CALCIUM: 9 mg/dL (ref 8.7–10.4)
CHLORIDE: 102 mmol/L (ref 98–107)
CO2: 28 mmol/L (ref 20.0–31.0)
CREATININE: 1.09 mg/dL — ABNORMAL HIGH (ref 0.55–1.02)
EGFR CKD-EPI (2021) FEMALE: 51 mL/min/1.73m2 — ABNORMAL LOW (ref >=60)
GLUCOSE RANDOM: 76 mg/dL (ref 70–179)
POTASSIUM: 4.7 mmol/L (ref 3.4–4.8)
SODIUM: 139 mmol/L (ref 135–145)

## 2024-04-03 LAB — CBC
HEMATOCRIT: 25.5 % — ABNORMAL LOW (ref 34.0–44.0)
HEMOGLOBIN: 8.1 g/dL — ABNORMAL LOW (ref 11.3–14.9)
MEAN CORPUSCULAR HEMOGLOBIN CONC: 31.8 g/dL — ABNORMAL LOW (ref 32.0–36.0)
MEAN CORPUSCULAR HEMOGLOBIN: 28.6 pg (ref 25.9–32.4)
MEAN CORPUSCULAR VOLUME: 90.1 fL (ref 77.6–95.7)
MEAN PLATELET VOLUME: 9.1 fL (ref 6.8–10.7)
PLATELET COUNT: 277 10*9/L (ref 150–450)
RED BLOOD CELL COUNT: 2.84 10*12/L — ABNORMAL LOW (ref 3.95–5.13)
RED CELL DISTRIBUTION WIDTH: 21.6 % — ABNORMAL HIGH (ref 12.2–15.2)
WBC ADJUSTED: 7.8 10*9/L (ref 3.6–11.2)

## 2024-04-03 LAB — MAGNESIUM: MAGNESIUM: 2.2 mg/dL (ref 1.6–2.6)

## 2024-04-03 LAB — PHOSPHORUS: PHOSPHORUS: 4.3 mg/dL (ref 2.4–5.1)

## 2024-04-03 MED ADMIN — polyethylene glycol (MIRALAX) packet 17 g: 17 g | GASTROENTERAL | @ 14:00:00

## 2024-04-03 MED ADMIN — enoxaparin (LOVENOX) syringe 50 mg: 50 mg | SUBCUTANEOUS | @ 14:00:00

## 2024-04-03 MED ADMIN — acetaminophen (TYLENOL) tablet 1,000 mg: 1000 mg | GASTROENTERAL | @ 10:00:00

## 2024-04-03 MED ADMIN — ipratropium (ATROVENT) 0.02 % nebulizer solution 500 mcg: 500 ug | RESPIRATORY_TRACT | @ 01:00:00

## 2024-04-03 MED ADMIN — acetaminophen (TYLENOL) tablet 1,000 mg: 1000 mg | GASTROENTERAL | @ 19:00:00

## 2024-04-03 MED ADMIN — ipratropium (ATROVENT) 0.02 % nebulizer solution 500 mcg: 500 ug | RESPIRATORY_TRACT | @ 15:00:00

## 2024-04-03 MED ADMIN — melatonin tablet 4.5 mg: 4.5 mg | GASTROENTERAL | @ 01:00:00

## 2024-04-03 MED ADMIN — acetaminophen (TYLENOL) tablet 1,000 mg: 1000 mg | GASTROENTERAL | @ 22:00:00

## 2024-04-03 MED ADMIN — atorvastatin (LIPITOR) tablet 40 mg: 40 mg | GASTROENTERAL | @ 14:00:00

## 2024-04-03 MED ADMIN — enoxaparin (LOVENOX) syringe 50 mg: 50 mg | SUBCUTANEOUS | @ 01:00:00

## 2024-04-03 MED ADMIN — methocarbamol (ROBAXIN) tablet 500 mg: 500 mg | ORAL | @ 01:00:00

## 2024-04-03 MED ADMIN — ipratropium (ATROVENT) 0.02 % nebulizer solution 500 mcg: 500 ug | RESPIRATORY_TRACT | @ 20:00:00

## 2024-04-03 MED ADMIN — ipratropium (ATROVENT) 0.02 % nebulizer solution 500 mcg: 500 ug | RESPIRATORY_TRACT | @ 09:00:00

## 2024-04-03 MED ADMIN — acetaminophen (TYLENOL) tablet 1,000 mg: 1000 mg | GASTROENTERAL | @ 04:00:00

## 2024-04-03 MED ADMIN — carboxymethylcellulose sodium (THERATEARS) 0.25 % ophthalmic solution 2 drop: 2 [drp] | OPHTHALMIC | @ 01:00:00

## 2024-04-03 MED ADMIN — arformoterol (BROVANA) nebulizer solution 15 mcg/2 mL: 15 ug | RESPIRATORY_TRACT | @ 01:00:00

## 2024-04-03 MED ADMIN — arformoterol (BROVANA) nebulizer solution 15 mcg/2 mL: 15 ug | RESPIRATORY_TRACT | @ 12:00:00

## 2024-04-03 NOTE — Unmapped (Signed)
 [place] TRAUMA, ACUTE CARE, and GENERAL SURGERY   - Surgery Daily Progress Note -  04/03/2024     Admit Date: 02/26/2024, Hospital Day: 77  Hospital Service: [facility])  Attending: Sheralyn Dies, MD  [facility] [place] - [facility]  General / Trauma Surgery - Trauma/CC     Assessment     Tammy Braun 83 y.o. female, PMH bladder cancer with cystectomy and ileal conduit, COPD, recent dx of afib, moderate-severe TR, CAD (high calcification score), T2DM presented to [facility] with projectile vomiting and abdominal pain, found to have SBO with transition point at level of ileostomy on CT A/P. OR on 02/28/24 for ex-lap, reduction of parastomal hernia, small bowel resection with primary anastomosis and lysis of adhesions.     Interval Events:  -02/26/24: Admitted to [facility] floor; urology consult for possible ileal conduit revision.   -02/27/24: Patient level of care escalated to step-down d/t increased oxygen needs   -02/28/24: Upgraded to ICU. Intubated at bed-side. OR for ex-lap, reduction of parastomal hernia, small bowel resection with primary anastomosis, lysis of adhesions, abdomen left open with ABThera in place.   -03/01/24: OR for attempt at abdominal closure. Left open due to desaturations during closure attempt. ABThera in place.  -03/03/24: RTOR for abdominal closure  -03/06/24: Vas cath placed, CRRT started   -03/09/24: Extubated to HFNC  -03/10/24: CRRT discontinued   -03/11/24: Re-intubated for hypoxic respiratory failure 2/2 mucus plug   -03/14/24 Extubated to HFNC   -03/15/24: Vas cath removed   -03/16/24: Placed on BiPap for complete collapse of L lung 2/2 probable mucus plugging  -03/19/24: Re-intubated for mucus plugging. Therapeutic bronch, BAL sent.   -03/22/24: Extubated to HFNC  -03/27/24: Transferred to stepdown      Plan     Neuro:   - Acute pain:Well controlled  - Tylenol PRN  - at bedtime robaxin (takes flexeril at bedtime at home)    *Insomnia  - Melatonin PRN    *Schizoaffective disorder  - Continue Prolixin monthly (last dose 3/14)     CV:   *Hx HTN   - HDS  - Holding home amlodipine given low/normal pressures     *HFpEF, pulmonary HTN   - ECHO 3/12- EF 70%, severe pulmonary HTN     *Hx of HLD  - Continue lipitor 40mg  daily    *Afib-flutter w/ RVR:  *Hx of paroxysmal Afib  - Was taking diltiazem outpatient; previously held due to likely poor absorption and started on amiodarone in ICU.  - Consulted cardiology 3/28 for amiodarone plan; recommend continuining PO amiodarone 200 mg daily through admission and upon discharge (even with bradycardia, as they feel she is at her baseline rate s/p conversion)  - Amiodarone reduced to 100mg  3/30 per daughter preference and ok per cardiology  - Amiodarone increased to 150mg  4/1 due to elevated HR  - Cardiology recommending stopping amiodarone 4/4  - Page closer to DC for follow-up. Can convert to DOAC as able. (Transitioned to lovenox as below)    Resp:   *Acute on chronic hypoxemia, hx COPD  *Pulmonary edema  *Pulmonary HTN   - 02 requirement now 1L via [place], Wean supplemental oxygen as able, sat goal > 88%   - Continue pulmonary toliet: OOB and IS   - Continue atrovent q6h, brovana BID   - CXR 4/2 showed improved pleural effusions  - CXR today showed bibasilar atelectasis   - Diuresing prn daily with goal of net -500 to -1L    *Pulmonary embolism:   -  3/4 CTA chest: filling defect in L anterior segmental artery    - Heparin  gtt  transitioned to therapeutic lovenox  3/30  - Plan for DOAC closer to discharge    Fen/GI:   - Diet: Regular diet with food cut up in small pieces. Cal count in place. Supplements ordered.   - NGT removed 4/2 as patient was meeting calorie and feeding goals.   - Patient aware NGT may be replaced if unable to meet nutrition goals  - RD to discuss with patient and daughter today  - Severe Protein-Calorie Malnutrition in the context of acute illness or injury (03/10/24 0901)  Fat Loss: Moderate  Muscle Loss: Severe  Fluid Accumulation: Moderate  Malnutrition Score: 3  - Fluids: ML   - Zofran  as needed for nausea   - Replete lytes prn: None needed today  - Bowel regimen: Miralax , senna    #Constipation (resolved)  - Large stool burden visualized on CXRs  - Had small BM after suppository on 3/29  - SMOG and mineral oil vix corpak on 3/30 with no results  - SMOG repeated 3/31 with no results  - KUB 4/1 showed large amount of stool in colon and rectum  - Patient had multiple large BM 4/3  - Continue daily bowel regimen    #S/p ex-lap, reduction of parastomal hernia, small bowel resection with primary anastomosis, lysis of adhesions   - WTD dressing changes daily per nursing to midline incision    #Large hiatal hernia, chronic  - re-eval on CT chest/abd/pelvis 3/13 stable    GU:   - Adequate UOP.   - Cr WNL  - Voiding spontaneously--> urostomy     *AKI on CKD (resolved)  - Cr improved  - Received CRRT x2  - Nephrology signed off  - Diuresis as needed    *H/o bladder cancer s/p radical cystectomy with ileal conduit (2012):  *Parastomal hernia:  - Strict I&Os  - Ok for no foley in stoma at this time per urology  - Adequate UOP from stoma    Heme  - Hgb stable    ID:   - Afebrile.  - WBC- No leukocytosis.  - C-Diff negative 3/26    Endocrine:   *Hyperglycemia, improved   *Hypoglycemia, 4/3  - NPH 15 units BID > change to NPH 12 units BID d/t recent hypoglycemic episodes  - Regular SSI q6hr   - POCT increased to q4h    PPx:   Therapeutic lovenox     Dispo: Stepdown status  PT: 5XL  OT: 5XL  ST: 5X  Barriers to discharge:  long term feeding plan, O2 requirement, dispo  CM/SW assisting in discharge planning.     Contact: [facility] Stepdown [phone]  Francenia Ingle, PA        Subjective   Patient's daughter expressing frustration this morning; requesting to speak with RD about poor nutrition status and Cardiology about bradycardia. Patient resting comfortably in bed.    Objective     Vitals:   Temp:  [36.6 ??C (97.9 ??F)-36.8 ??C (98.2 ??F)] 36.6 ??C (97.9 ??F)  Heart Rate:  [49-58] 54  SpO2 Pulse:  [49-59] 54  Resp:  [16-25] 18  BP: (115-149)/(50-66) 120/66  MAP (mmHg):  [75-97] 81  FiO2 (%):  [24 %] 24 %  SpO2:  [93 %-100 %] 97 %    Intake/Output last 24 hours:  I/O last 3 completed shifts:  In: 665.3 [P.O.:600; I.V.:65.3]  Out: 1675 [Urine:1675]    Physical Exam:    -  General:  Appropriate, comfortable and in no apparent distress.   -Neurological: Alert and oriented with appropriate conversation. Moves all 4 extremities spontaneously.   -ENT: EOMI  -Psych: speech appropriate, pleasant affect  -Cardiovascular: Regular rate and rhythm  -Pulmonary: Normal work of breathing. No accessory muscle use, currently on 1L via [place]   -Abdomen: Soft, non-distended. Appropriately tender to palpation post- operatively. No rebound or guarding. Midline incision with island dressing place. 2 small openings packed.  No purulence.  -Genitourinary: Voiding spontaneously.  Ileal conduit urostomy pink and moist with good UOP.  -Extremities: Warm, well perfused, normal skin turgor.       Data Review:    I have reviewed the labs and studies from the last 24 hours.    Imaging: Radiology studies were personally reviewed       ATTENDING ATTESTATION:  I was the supervising physician in the delivery of the service.  ______________________________

## 2024-04-03 NOTE — Unmapped (Signed)
 Problem: Adult Inpatient Plan of Care  Goal: Plan of Care Review  Outcome: Progressing  Goal: Patient-Specific Goal (Individualized)  Outcome: Progressing  Goal: Absence of Hospital-Acquired Illness or Injury  Outcome: Progressing  Intervention: Prevent Skin Injury  Recent Flowsheet Documentation  Taken 04/03/2024 0800 by Lorri Frederick, RN  Positioning for Skin:   Right   Supine/Back  Device Skin Pressure Protection:   absorbent pad utilized/changed   tubing/devices free from skin contact   skin-to-skin areas padded   skin-to-device areas padded   positioning supports utilized   adhesive use limited  Skin Protection:   adhesive use limited   incontinence pads utilized   pouching devices used   protective footwear used   skin sealant/moisture barrier applied   skin-to-skin areas padded   skin-to-device areas padded   transparent dressing maintained   tubing/devices free from skin contact   zinc oxide barrier cream  Goal: Optimal Comfort and Wellbeing  Outcome: Progressing  Goal: Readiness for Transition of Care  Outcome: Progressing  Goal: Rounds/Family Conference  Outcome: Progressing     Problem: Infection  Goal: Absence of Infection Signs and Symptoms  Outcome: Progressing     Problem: Fall Injury Risk  Goal: Absence of Fall and Fall-Related Injury  Outcome: Progressing     Problem: Wound  Goal: Optimal Coping  Outcome: Progressing  Goal: Optimal Functional Ability  Outcome: Progressing  Goal: Absence of Infection Signs and Symptoms  Outcome: Progressing  Goal: Improved Oral Intake  Outcome: Progressing  Goal: Optimal Pain Control and Function  Outcome: Progressing  Goal: Skin Health and Integrity  Outcome: Progressing  Intervention: Optimize Skin Protection  Recent Flowsheet Documentation  Taken 04/03/2024 0800 by Lorri Frederick, RN  Pressure Reduction Techniques:   frequent weight shift encouraged   heels elevated off bed   weight shift assistance provided  Pressure Reduction Devices:   pressure-redistributing mattress utilized   positioning supports utilized   heel offloading device utilized  Skin Protection:   adhesive use limited   incontinence pads utilized   pouching devices used   protective footwear used   skin sealant/moisture barrier applied   skin-to-skin areas padded   skin-to-device areas padded   transparent dressing maintained   tubing/devices free from skin contact   zinc oxide barrier cream  Goal: Optimal Wound Healing  Outcome: Progressing     Problem: Skin Injury Risk Increased  Goal: Skin Health and Integrity  Outcome: Progressing  Intervention: Optimize Skin Protection  Recent Flowsheet Documentation  Taken 04/03/2024 0800 by Lorri Frederick, RN  Pressure Reduction Techniques:   frequent weight shift encouraged   heels elevated off bed   weight shift assistance provided  Pressure Reduction Devices:   pressure-redistributing mattress utilized   positioning supports utilized   heel offloading device utilized  Skin Protection:   adhesive use limited   incontinence pads utilized   pouching devices used   protective footwear used   skin sealant/moisture barrier applied   skin-to-skin areas padded   skin-to-device areas padded   transparent dressing maintained   tubing/devices free from skin contact   zinc oxide barrier cream     Problem: Self-Care Deficit  Goal: Improved Ability to Complete Activities of Daily Living  Outcome: Progressing     Problem: Malnutrition  Goal: Improved Nutritional Intake  Outcome: Progressing     Problem: Breathing Pattern Ineffective  Goal: Effective Breathing Pattern  Outcome: Progressing

## 2024-04-03 NOTE — Unmapped (Signed)
 Problem: Adult Inpatient Plan of Care  Goal: Plan of Care Review  Outcome: Ongoing - Unchanged  Goal: Patient-Specific Goal (Individualized)  Outcome: Ongoing - Unchanged  Goal: Absence of Hospital-Acquired Illness or Injury  Outcome: Ongoing - Unchanged  Intervention: Identify and Manage Fall Risk  Recent Flowsheet Documentation  Taken 04/03/2024 0600 by Jule Ser, RN  Safety Interventions: bed alarm  Taken 04/03/2024 0400 by Jule Ser, RN  Safety Interventions: bed alarm  Taken 04/03/2024 0200 by Jule Ser, RN  Safety Interventions: bed alarm  Taken 04/03/2024 0000 by Jule Ser, RN  Safety Interventions: bed alarm  Taken 04/02/2024 2200 by Jule Ser, RN  Safety Interventions: bed alarm  Taken 04/02/2024 2000 by Jule Ser, RN  Safety Interventions: bed alarm  Intervention: Prevent Skin Injury  Recent Flowsheet Documentation  Taken 04/03/2024 0600 by Jule Ser, RN  Positioning for Skin: Left  Device Skin Pressure Protection: adhesive use limited  Skin Protection: adhesive use limited  Taken 04/03/2024 0418 by Jule Ser, RN  Positioning for Skin: Right  Device Skin Pressure Protection: adhesive use limited  Skin Protection: adhesive use limited  Taken 04/03/2024 0400 by Jule Ser, RN  Positioning for Skin: Right  Device Skin Pressure Protection: adhesive use limited  Skin Protection: adhesive use limited  Taken 04/03/2024 0200 by Jule Ser, RN  Positioning for Skin: Left  Device Skin Pressure Protection: adhesive use limited  Skin Protection: adhesive use limited  Taken 04/03/2024 0019 by Jule Ser, RN  Positioning for Skin: Right  Device Skin Pressure Protection: adhesive use limited  Skin Protection: adhesive use limited  Taken 04/03/2024 0000 by Jule Ser, RN  Positioning for Skin: Right  Device Skin Pressure Protection: adhesive use limited  Skin Protection: adhesive use limited  Taken 04/02/2024 2200 by Jule Ser, RN  Positioning for Skin: Left  Device Skin Pressure Protection: adhesive use limited  Skin Protection: adhesive use limited  Taken 04/02/2024 2000 by Jule Ser, RN  Positioning for Skin: Right  Device Skin Pressure Protection: adhesive use limited  Skin Protection: adhesive use limited  Intervention: Prevent and Manage VTE (Venous Thromboembolism) Risk  Recent Flowsheet Documentation  Taken 04/03/2024 0418 by Jule Ser, RN  Anti-Embolism Device Type: SCD, Knee  Anti-Embolism Device Status: On  Anti-Embolism Device Location: BLE  Taken 04/03/2024 0019 by Jule Ser, RN  Anti-Embolism Device Type: SCD, Knee  Anti-Embolism Device Status: On  Anti-Embolism Device Location: BLE  Taken 04/02/2024 2000 by Jule Ser, RN  Anti-Embolism Device Type: SCD, Knee  Anti-Embolism Device Status: On  Anti-Embolism Device Location: BLE  Intervention: Prevent Infection  Recent Flowsheet Documentation  Taken 04/03/2024 0600 by Jule Ser, RN  Infection Prevention: hand hygiene promoted  Taken 04/03/2024 0400 by Jule Ser, RN  Infection Prevention: hand hygiene promoted  Taken 04/03/2024 0200 by Jule Ser, RN  Infection Prevention: hand hygiene promoted  Taken 04/03/2024 0000 by Jule Ser, RN  Infection Prevention: hand hygiene promoted  Taken 04/02/2024 2200 by Jule Ser, RN  Infection Prevention: hand hygiene promoted  Taken 04/02/2024 2000 by Jule Ser, RN  Infection Prevention: hand hygiene promoted  Goal: Optimal Comfort and Wellbeing  Outcome: Ongoing - Unchanged  Goal: Readiness for Transition of Care  Outcome: Ongoing - Unchanged  Goal: Rounds/Family Conference  Outcome: Ongoing - Unchanged     Problem: Infection  Goal: Absence of Infection Signs and Symptoms  Outcome: Ongoing - Unchanged  Intervention: Prevent or Manage Infection  Recent Flowsheet Documentation  Taken 04/03/2024 0600 by Jule Ser, RN  Infection Management: aseptic technique maintained  Taken 04/03/2024 0400 by Jule Ser, RN  Infection Management: aseptic technique maintained  Taken 04/03/2024 0200 by Jule Ser, RN  Infection Management: aseptic technique maintained  Taken 04/03/2024 0000 by Jule Ser, RN  Infection Management: aseptic technique maintained  Taken 04/02/2024 2200 by Jule Ser, RN  Infection Management: aseptic technique maintained  Taken 04/02/2024 2000 by Jule Ser, RN  Infection Management: aseptic technique maintained     Problem: Fall Injury Risk  Goal: Absence of Fall and Fall-Related Injury  Outcome: Ongoing - Unchanged  Intervention: Promote Injury-Free Environment  Recent Flowsheet Documentation  Taken 04/03/2024 0600 by Jule Ser, RN  Safety Interventions: bed alarm  Taken 04/03/2024 0400 by Jule Ser, RN  Safety Interventions: bed alarm  Taken 04/03/2024 0200 by Jule Ser, RN  Safety Interventions: bed alarm  Taken 04/03/2024 0000 by Jule Ser, RN  Safety Interventions: bed alarm  Taken 04/02/2024 2200 by Jule Ser, RN  Safety Interventions: bed alarm  Taken 04/02/2024 2000 by Jule Ser, RN  Safety Interventions: bed alarm     Problem: Wound  Goal: Optimal Coping  Outcome: Ongoing - Unchanged  Goal: Optimal Functional Ability  Outcome: Ongoing - Unchanged  Intervention: Optimize Functional Ability  Recent Flowsheet Documentation  Taken 04/03/2024 0600 by Jule Ser, RN  Activity Management: bedrest  Taken 04/03/2024 0400 by Jule Ser, RN  Activity Management: bedrest  Taken 04/03/2024 0200 by Jule Ser, RN  Activity Management: bedrest  Taken 04/03/2024 0000 by Jule Ser, RN  Activity Management: bedrest  Taken 04/02/2024 2200 by Jule Ser, RN  Activity Management: bedrest  Taken 04/02/2024 2000 by Jule Ser, RN  Activity Management: bedrest  Goal: Absence of Infection Signs and Symptoms  Outcome: Ongoing - Unchanged  Intervention: Prevent or Manage Infection  Recent Flowsheet Documentation  Taken 04/03/2024 0600 by Jule Ser, RN  Infection Management: aseptic technique maintained  Taken 04/03/2024 0400 by Jule Ser, RN  Infection Management: aseptic technique maintained  Taken 04/03/2024 0200 by Jule Ser, RN  Infection Management: aseptic technique maintained  Taken 04/03/2024 0000 by Jule Ser, RN  Infection Management: aseptic technique maintained  Taken 04/02/2024 2200 by Jule Ser, RN  Infection Management: aseptic technique maintained  Taken 04/02/2024 2000 by Jule Ser, RN  Infection Management: aseptic technique maintained  Goal: Improved Oral Intake  Outcome: Ongoing - Unchanged  Goal: Optimal Pain Control and Function  Outcome: Ongoing - Unchanged  Goal: Skin Health and Integrity  Outcome: Ongoing - Unchanged  Intervention: Optimize Skin Protection  Recent Flowsheet Documentation  Taken 04/03/2024 0600 by Jule Ser, RN  Activity Management: bedrest  Pressure Reduction Techniques: frequent weight shift encouraged  Head of Bed (HOB) Positioning: HOB at 30-45 degrees  Pressure Reduction Devices: pressure-redistributing mattress utilized  Skin Protection: adhesive use limited  Taken 04/03/2024 0418 by Jule Ser, RN  Pressure Reduction Techniques: frequent weight shift encouraged  Pressure Reduction Devices: pressure-redistributing mattress utilized  Skin Protection: adhesive use limited  Taken 04/03/2024 0400 by Jule Ser, RN  Activity Management: bedrest  Pressure Reduction Techniques: frequent weight shift encouraged  Head of Bed (HOB) Positioning: HOB at 30-45 degrees  Pressure Reduction Devices: pressure-redistributing mattress utilized  Skin Protection: adhesive use limited  Taken 04/03/2024 0200 by Jule Ser, RN  Activity Management: bedrest  Pressure Reduction Techniques: frequent weight shift encouraged  Head of Bed (HOB) Positioning: HOB at 30-45 degrees  Pressure Reduction Devices: pressure-redistributing mattress utilized  Skin Protection: adhesive use limited  Taken 04/03/2024 0019  by Jule Ser, RN  Pressure Reduction Techniques: frequent weight shift encouraged  Pressure Reduction Devices: pressure-redistributing mattress utilized  Skin Protection: adhesive use limited  Taken 04/03/2024 0000 by Jule Ser, RN  Activity Management: bedrest  Pressure Reduction Techniques: frequent weight shift encouraged  Head of Bed (HOB) Positioning: HOB at 30-45 degrees  Pressure Reduction Devices: pressure-redistributing mattress utilized  Skin Protection: adhesive use limited  Taken 04/02/2024 2200 by Jule Ser, RN  Activity Management: bedrest  Pressure Reduction Techniques: frequent weight shift encouraged  Head of Bed (HOB) Positioning: HOB at 30-45 degrees  Pressure Reduction Devices: pressure-redistributing mattress utilized  Skin Protection: adhesive use limited  Taken 04/02/2024 2000 by Jule Ser, RN  Activity Management: bedrest  Pressure Reduction Techniques: frequent weight shift encouraged  Head of Bed (HOB) Positioning: HOB at 30-45 degrees  Pressure Reduction Devices: pressure-redistributing mattress utilized  Skin Protection: adhesive use limited  Goal: Optimal Wound Healing  Outcome: Ongoing - Unchanged     Problem: Skin Injury Risk Increased  Goal: Skin Health and Integrity  Outcome: Ongoing - Unchanged  Intervention: Optimize Skin Protection  Recent Flowsheet Documentation  Taken 04/03/2024 0600 by Jule Ser, RN  Activity Management: bedrest  Pressure Reduction Techniques: frequent weight shift encouraged  Head of Bed (HOB) Positioning: HOB at 30-45 degrees  Pressure Reduction Devices: pressure-redistributing mattress utilized  Skin Protection: adhesive use limited  Taken 04/03/2024 0418 by Jule Ser, RN  Pressure Reduction Techniques: frequent weight shift encouraged  Pressure Reduction Devices: pressure-redistributing mattress utilized  Skin Protection: adhesive use limited  Taken 04/03/2024 0400 by Jule Ser, RN  Activity Management: bedrest  Pressure Reduction Techniques: frequent weight shift encouraged  Head of Bed (HOB) Positioning: HOB at 30-45 degrees  Pressure Reduction Devices: pressure-redistributing mattress utilized  Skin Protection: adhesive use limited  Taken 04/03/2024 0200 by Jule Ser, RN  Activity Management: bedrest  Pressure Reduction Techniques: frequent weight shift encouraged  Head of Bed (HOB) Positioning: HOB at 30-45 degrees  Pressure Reduction Devices: pressure-redistributing mattress utilized  Skin Protection: adhesive use limited  Taken 04/03/2024 0019 by Jule Ser, RN  Pressure Reduction Techniques: frequent weight shift encouraged  Pressure Reduction Devices: pressure-redistributing mattress utilized  Skin Protection: adhesive use limited  Taken 04/03/2024 0000 by Jule Ser, RN  Activity Management: bedrest  Pressure Reduction Techniques: frequent weight shift encouraged  Head of Bed (HOB) Positioning: HOB at 30-45 degrees  Pressure Reduction Devices: pressure-redistributing mattress utilized  Skin Protection: adhesive use limited  Taken 04/02/2024 2200 by Jule Ser, RN  Activity Management: bedrest  Pressure Reduction Techniques: frequent weight shift encouraged  Head of Bed (HOB) Positioning: HOB at 30-45 degrees  Pressure Reduction Devices: pressure-redistributing mattress utilized  Skin Protection: adhesive use limited  Taken 04/02/2024 2000 by Jule Ser, RN  Activity Management: bedrest  Pressure Reduction Techniques: frequent weight shift encouraged  Head of Bed (HOB) Positioning: HOB at 30-45 degrees  Pressure Reduction Devices: pressure-redistributing mattress utilized  Skin Protection: adhesive use limited     Problem: Self-Care Deficit  Goal: Improved Ability to Complete Activities of Daily Living  Outcome: Ongoing - Unchanged     Problem: Malnutrition  Goal: Improved Nutritional Intake  Outcome: Ongoing - Unchanged     Problem: Breathing Pattern Ineffective  Goal: Effective Breathing Pattern  Outcome: Ongoing - Unchanged  Intervention: Promote Improved Breathing Pattern  Recent Flowsheet Documentation  Taken 04/03/2024 0600 by Jule Ser, RN  Head of Bed [facility]) Positioning: HOB at 30-45 degrees  Taken 04/03/2024 0400 by Jule Ser,  RN  Head of Bed [facility]) Positioning: HOB at 30-45 degrees  Taken 04/03/2024 0200 by Jule Ser, RN  Head of Bed [facility]) Positioning: HOB at 30-45 degrees  Taken 04/03/2024 0000 by Jule Ser, RN  Head of Bed [facility]) Positioning: HOB at 30-45 degrees  Taken 04/02/2024 2200 by Jule Ser, RN  Head of Bed [facility]) Positioning: HOB at 30-45 degrees  Taken 04/02/2024 2000 by Jule Ser, RN  Head of Bed [facility]) Positioning: HOB at 30-45 degrees

## 2024-04-04 LAB — CBC
HEMATOCRIT: 28.1 % — ABNORMAL LOW (ref 34.0–44.0)
HEMOGLOBIN: 8.7 g/dL — ABNORMAL LOW (ref 11.3–14.9)
MEAN CORPUSCULAR HEMOGLOBIN CONC: 30.9 g/dL — ABNORMAL LOW (ref 32.0–36.0)
MEAN CORPUSCULAR HEMOGLOBIN: 28 pg (ref 25.9–32.4)
MEAN CORPUSCULAR VOLUME: 90.5 fL (ref 77.6–95.7)
MEAN PLATELET VOLUME: 9.1 fL (ref 6.8–10.7)
PLATELET COUNT: 310 10*9/L (ref 150–450)
RED BLOOD CELL COUNT: 3.1 10*12/L — ABNORMAL LOW (ref 3.95–5.13)
RED CELL DISTRIBUTION WIDTH: 21.5 % — ABNORMAL HIGH (ref 12.2–15.2)
WBC ADJUSTED: 6.5 10*9/L (ref 3.6–11.2)

## 2024-04-04 LAB — PHOSPHORUS: PHOSPHORUS: 4.1 mg/dL (ref 2.4–5.1)

## 2024-04-04 LAB — MAGNESIUM: MAGNESIUM: 2.1 mg/dL (ref 1.6–2.6)

## 2024-04-04 LAB — BASIC METABOLIC PANEL
ANION GAP: 9 mmol/L (ref 5–14)
BLOOD UREA NITROGEN: 45 mg/dL — ABNORMAL HIGH (ref 9–23)
BUN / CREAT RATIO: 44
CALCIUM: 9.2 mg/dL (ref 8.7–10.4)
CHLORIDE: 102 mmol/L (ref 98–107)
CO2: 29 mmol/L (ref 20.0–31.0)
CREATININE: 1.03 mg/dL — ABNORMAL HIGH (ref 0.55–1.02)
EGFR CKD-EPI (2021) FEMALE: 55 mL/min/1.73m2 — ABNORMAL LOW (ref >=60)
GLUCOSE RANDOM: 117 mg/dL (ref 70–179)
POTASSIUM: 4.8 mmol/L (ref 3.4–4.8)
SODIUM: 140 mmol/L (ref 135–145)

## 2024-04-04 MED ADMIN — arformoterol (BROVANA) nebulizer solution 15 mcg/2 mL: 15 ug | RESPIRATORY_TRACT | @ 13:00:00

## 2024-04-04 MED ADMIN — pregabalin (LYRICA) capsule 50 mg: 50 mg | ORAL | @ 18:00:00

## 2024-04-04 MED ADMIN — methocarbamol (ROBAXIN) tablet 500 mg: 500 mg | ORAL | @ 01:00:00

## 2024-04-04 MED ADMIN — insulin regular (HumuLIN,NovoLIN) injection 0-20 Units: 0-20 [IU] | SUBCUTANEOUS | @ 04:00:00 | Stop: 2024-04-04

## 2024-04-04 MED ADMIN — enoxaparin (LOVENOX) syringe 50 mg: 50 mg | SUBCUTANEOUS | @ 15:00:00

## 2024-04-04 MED ADMIN — acetaminophen (TYLENOL) tablet 1,000 mg: 1000 mg | GASTROENTERAL | @ 17:00:00 | Stop: 2024-04-04

## 2024-04-04 MED ADMIN — acetaminophen (TYLENOL) tablet 1,000 mg: 1000 mg | GASTROENTERAL | @ 10:00:00 | Stop: 2024-04-04

## 2024-04-04 MED ADMIN — cholecalciferol (vitamin D3 25 mcg (1,000 units)) tablet 50 mcg: 50 ug | ORAL | @ 17:00:00

## 2024-04-04 MED ADMIN — polyethylene glycol (MIRALAX) packet 17 g: 17 g | GASTROENTERAL | @ 15:00:00 | Stop: 2024-04-04

## 2024-04-04 MED ADMIN — arformoterol (BROVANA) nebulizer solution 15 mcg/2 mL: 15 ug | RESPIRATORY_TRACT | @ 01:00:00

## 2024-04-04 MED ADMIN — pregabalin (LYRICA) capsule 50 mg: 50 mg | ORAL | @ 14:00:00 | Stop: 2024-04-04

## 2024-04-04 MED ADMIN — insulin NPH (HumuLIN,NovoLIN) injection 12 Units: 12 [IU] | SUBCUTANEOUS | @ 22:00:00

## 2024-04-04 MED ADMIN — insulin lispro (HumaLOG) injection 0-20 Units: 0-20 [IU] | SUBCUTANEOUS | @ 22:00:00

## 2024-04-04 MED ADMIN — ipratropium (ATROVENT) 0.02 % nebulizer solution 500 mcg: 500 ug | RESPIRATORY_TRACT | @ 01:00:00

## 2024-04-04 MED ADMIN — acetaminophen (TYLENOL) tablet 1,000 mg: 1000 mg | GASTROENTERAL | @ 04:00:00 | Stop: 2024-04-04

## 2024-04-04 MED ADMIN — melatonin tablet 4.5 mg: 4.5 mg | GASTROENTERAL | @ 01:00:00

## 2024-04-04 MED ADMIN — atorvastatin (LIPITOR) tablet 40 mg: 40 mg | GASTROENTERAL | @ 14:00:00 | Stop: 2024-04-04

## 2024-04-04 MED ADMIN — ipratropium (ATROVENT) 0.02 % nebulizer solution 500 mcg: 500 ug | RESPIRATORY_TRACT | @ 22:00:00

## 2024-04-04 MED ADMIN — carboxymethylcellulose sodium (THERATEARS) 0.25 % ophthalmic solution 2 drop: 2 [drp] | OPHTHALMIC | @ 01:00:00

## 2024-04-04 MED ADMIN — ipratropium (ATROVENT) 0.02 % nebulizer solution 500 mcg: 500 ug | RESPIRATORY_TRACT | @ 14:00:00

## 2024-04-04 MED ADMIN — enoxaparin (LOVENOX) syringe 50 mg: 50 mg | SUBCUTANEOUS | @ 01:00:00

## 2024-04-04 MED ADMIN — zolpidem (AMBIEN) tablet 5 mg: 5 mg | ORAL | @ 06:00:00 | Stop: 2024-04-04

## 2024-04-04 MED ADMIN — pregabalin (LYRICA) capsule 50 mg: 50 mg | ORAL | @ 22:00:00

## 2024-04-04 NOTE — Unmapped (Signed)
 Problem: Adult Inpatient Plan of Care  Goal: Plan of Care Review  Outcome: Ongoing - Unchanged  Goal: Patient-Specific Goal (Individualized)  Outcome: Ongoing - Unchanged  Goal: Absence of Hospital-Acquired Illness or Injury  Outcome: Ongoing - Unchanged  Intervention: Identify and Manage Fall Risk  Recent Flowsheet Documentation  Taken 04/04/2024 0200 by Makalia Bare, RN  Safety Interventions: bed alarm  Taken 04/04/2024 0000 by Marirose Deveney, RN  Safety Interventions: bed alarm  Taken 04/03/2024 2200 by Jiro Kiester, RN  Safety Interventions: bed alarm  Taken 04/03/2024 2000 by Kathia Covington, RN  Safety Interventions: bed alarm  Intervention: Prevent Skin Injury  Recent Flowsheet Documentation  Taken 04/04/2024 0200 by Irish Piech, RN  Positioning for Skin: Left  Device Skin Pressure Protection: adhesive use limited  Skin Protection: adhesive use limited  Taken 04/04/2024 0000 by Enola Hartigan, RN  Positioning for Skin: Right  Device Skin Pressure Protection: adhesive use limited  Skin Protection: adhesive use limited  Taken 04/03/2024 2200 by Enola Hartigan, RN  Positioning for Skin: Left  Device Skin Pressure Protection: adhesive use limited  Skin Protection: adhesive use limited  Taken 04/03/2024 2000 by Enola Hartigan, RN  Positioning for Skin: Right  Device Skin Pressure Protection: adhesive use limited  Skin Protection: adhesive use limited  Intervention: Prevent and Manage VTE (Venous Thromboembolism) Risk  Recent Flowsheet Documentation  Taken 04/04/2024 0000 by Hanley Rispoli, RN  Anti-Embolism Device Type: SCD, Knee  Anti-Embolism Device Status: On  Anti-Embolism Device Location: BLE  Taken 04/03/2024 2000 by Enola Hartigan, RN  Anti-Embolism Device Type: SCD, Knee  Anti-Embolism Device Status: On  Anti-Embolism Device Location: BLE  Intervention: Prevent Infection  Recent Flowsheet Documentation  Taken 04/04/2024 0200 by Raziah Funnell, RN  Infection Prevention: hand hygiene promoted  Taken 04/04/2024 0000 by Umer Harig, RN  Infection Prevention: hand hygiene promoted  Taken 04/03/2024 2200 by Alydia Gosser, RN  Infection Prevention: hand hygiene promoted  Taken 04/03/2024 2000 by Ilian Wessell, RN  Infection Prevention: hand hygiene promoted  Goal: Optimal Comfort and Wellbeing  Outcome: Ongoing - Unchanged  Goal: Readiness for Transition of Care  Outcome: Ongoing - Unchanged  Goal: Rounds/Family Conference  Outcome: Ongoing - Unchanged     Problem: Infection  Goal: Absence of Infection Signs and Symptoms  Outcome: Ongoing - Unchanged  Intervention: Prevent or Manage Infection  Recent Flowsheet Documentation  Taken 04/04/2024 0200 by Medea Deines, RN  Infection Management: aseptic technique maintained  Taken 04/04/2024 0000 by Hatsumi Steinhart, RN  Infection Management: aseptic technique maintained  Taken 04/03/2024 2200 by Kirill Chatterjee, RN  Infection Management: aseptic technique maintained  Taken 04/03/2024 2000 by Wilkes Potvin, RN  Infection Management: aseptic technique maintained     Problem: Fall Injury Risk  Goal: Absence of Fall and Fall-Related Injury  Outcome: Ongoing - Unchanged  Intervention: Promote Injury-Free Environment  Recent Flowsheet Documentation  Taken 04/04/2024 0200 by Theresia Pree, RN  Safety Interventions: bed alarm  Taken 04/04/2024 0000 by [place], RN  Safety Interventions: bed alarm  Taken 04/03/2024 2200 by Jubal Rademaker, RN  Safety Interventions: bed alarm  Taken 04/03/2024 2000 by Taeler Winning, RN  Safety Interventions: bed alarm     Problem: Wound  Goal: Optimal Coping  Outcome: Ongoing - Unchanged  Goal: Optimal Functional Ability  Outcome: Ongoing - Unchanged  Intervention: Optimize Functional Ability  Recent Flowsheet Documentation  Taken 04/04/2024 0200 by Larinda Herter, RN  Activity Management: bedrest  Taken 04/04/2024 0000 by Debera Sterba, RN  Activity  Management: bedrest  Taken 04/03/2024 2200 by Augustine Leverette, RN  Activity Management: bedrest  Taken 04/03/2024 2000 by Drea Jurewicz, RN  Activity Management: bedrest  Goal: Absence of Infection Signs and Symptoms  Outcome: Ongoing - Unchanged  Intervention: Prevent or Manage Infection  Recent Flowsheet Documentation  Taken 04/04/2024 0200 by Darcia Lampi, RN  Infection Management: aseptic technique maintained  Taken 04/04/2024 0000 by Jalena Vanderlinden, RN  Infection Management: aseptic technique maintained  Taken 04/03/2024 2200 by Larrisha Babineau, RN  Infection Management: aseptic technique maintained  Taken 04/03/2024 2000 by Khaliya Golinski, RN  Infection Management: aseptic technique maintained  Goal: Improved Oral Intake  Outcome: Ongoing - Unchanged  Goal: Optimal Pain Control and Function  Outcome: Ongoing - Unchanged  Goal: Skin Health and Integrity  Outcome: Ongoing - Unchanged  Intervention: Optimize Skin Protection  Recent Flowsheet Documentation  Taken 04/04/2024 0200 by Kurtiss Wence, RN  Activity Management: bedrest  Pressure Reduction Techniques: frequent weight shift encouraged  Head of Bed (HOB) Positioning: HOB at 30-45 degrees  Pressure Reduction Devices: pressure-redistributing mattress utilized  Skin Protection: adhesive use limited  Taken 04/04/2024 0000 by Marchele Decock, RN  Activity Management: bedrest  Pressure Reduction Techniques: frequent weight shift encouraged  Head of Bed (HOB) Positioning: HOB at 30-45 degrees  Pressure Reduction Devices: pressure-redistributing mattress utilized  Skin Protection: adhesive use limited  Taken 04/03/2024 2200 by Cleo Villamizar, RN  Activity Management: bedrest  Pressure Reduction Techniques: frequent weight shift encouraged  Head of Bed (HOB) Positioning: HOB at 30-45 degrees  Pressure Reduction Devices: pressure-redistributing mattress utilized  Skin Protection: adhesive use limited  Taken 04/03/2024 2000 by Enola Hartigan, RN  Activity Management: bedrest  Pressure Reduction Techniques: frequent weight shift encouraged  Head of Bed (HOB) Positioning: HOB at 30-45 degrees  Pressure Reduction Devices: pressure-redistributing mattress utilized  Skin Protection: adhesive use limited  Goal: Optimal Wound Healing  Outcome: Ongoing - Unchanged     Problem: Skin Injury Risk Increased  Goal: Skin Health and Integrity  Outcome: Ongoing - Unchanged  Intervention: Optimize Skin Protection  Recent Flowsheet Documentation  Taken 04/04/2024 0200 by Garren Greenman, RN  Activity Management: bedrest  Pressure Reduction Techniques: frequent weight shift encouraged  Head of Bed (HOB) Positioning: HOB at 30-45 degrees  Pressure Reduction Devices: pressure-redistributing mattress utilized  Skin Protection: adhesive use limited  Taken 04/04/2024 0000 by Christyann Manolis, RN  Activity Management: bedrest  Pressure Reduction Techniques: frequent weight shift encouraged  Head of Bed (HOB) Positioning: HOB at 30-45 degrees  Pressure Reduction Devices: pressure-redistributing mattress utilized  Skin Protection: adhesive use limited  Taken 04/03/2024 2200 by Elvenia Godden, RN  Activity Management: bedrest  Pressure Reduction Techniques: frequent weight shift encouraged  Head of Bed (HOB) Positioning: HOB at 30-45 degrees  Pressure Reduction Devices: pressure-redistributing mattress utilized  Skin Protection: adhesive use limited  Taken 04/03/2024 2000 by Valeria Boza, RN  Activity Management: bedrest  Pressure Reduction Techniques: frequent weight shift encouraged  Head of Bed (HOB) Positioning: HOB at 30-45 degrees  Pressure Reduction Devices: pressure-redistributing mattress utilized  Skin Protection: adhesive use limited     Problem: Self-Care Deficit  Goal: Improved Ability to Complete Activities of Daily Living  Outcome: Ongoing - Unchanged     Problem: Malnutrition  Goal: Improved Nutritional Intake  Outcome: Ongoing - Unchanged     Problem: Breathing Pattern Ineffective  Goal: Effective Breathing Pattern  Outcome: Ongoing - Unchanged  Intervention: Promote Improved Breathing Pattern  Recent Flowsheet Documentation  Taken 04/04/2024 0200  by Egidio Lofgren, RN  Head of Bed [facility]) Positioning: HOB at 30-45 degrees  Taken 04/04/2024 0000 by Burnette Valenti, RN  Head of Bed [facility]) Positioning: HOB at 30-45 degrees  Taken 04/03/2024 2200 by Tavone Caesar, RN  Head of Bed [facility]) Positioning: HOB at 30-45 degrees  Taken 04/03/2024 2000 by Lundon Rosier, RN  Head of Bed [facility]) Positioning: HOB at 30-45 degrees

## 2024-04-04 NOTE — Unmapped (Signed)
 [place] TRAUMA, ACUTE CARE, and GENERAL SURGERY   - Surgery Daily Progress Note -  04/05/2024     Admit Date: 02/26/2024, Hospital Day: 40  Hospital Service: [facility])  Attending: Genna Khan, MD  [facility] - [facility]  General / Trauma Surgery - Trauma/CC     Assessment     Tammy Braun 83 y.o. female, PMH bladder cancer with cystectomy and ileal conduit, COPD, recent dx of afib, moderate-severe TR, CAD (high calcification score), T2DM presented to [facility] with projectile vomiting and abdominal pain, found to have SBO with transition point at level of ileostomy on CT A/P. OR on 02/28/24 for ex-lap, reduction of parastomal hernia, small bowel resection with primary anastomosis and lysis of adhesions.     Interval Events:  -02/26/24: Admitted to [facility] floor; urology consult for possible ileal conduit revision.   -02/27/24: Patient level of care escalated to step-down d/t increased oxygen needs   -02/28/24: Upgraded to ICU. Intubated at bed-side. OR for ex-lap, reduction of parastomal hernia, small bowel resection with primary anastomosis, lysis of adhesions, abdomen left open with ABThera in place.   -03/01/24: OR for attempt at abdominal closure. Left open due to desaturations during closure attempt. ABThera in place.  -03/03/24: RTOR for abdominal closure  -03/06/24: Vas cath placed, CRRT started   -03/09/24: Extubated to HFNC  -03/10/24: CRRT discontinued   -03/11/24: Re-intubated for hypoxic respiratory failure 2/2 mucus plug   -03/14/24 Extubated to HFNC   -03/15/24: Vas cath removed   -03/16/24: Placed on BiPap for complete collapse of L lung 2/2 probable mucus plugging  -03/19/24: Re-intubated for mucus plugging. Therapeutic bronch, BAL sent.   -03/22/24: Extubated to HFNC  -03/27/24: Transferred to stepdown  -04/03/24: Transferred to floor    Subjective   NAEON, sleeping well this am     Plan     Neuro:   - PRN APAP    *Insomnia  - Melatonin PRN    *Schizoaffective disorder  - Continue Prolixin monthly (last dose 3/14)     CV: *Hx HTN   - HDS  - Holding home amlodipine given low/normal pressures     *HFpEF, pulmonary HTN   - Echo (3/12) EF 70%, severe pulmonary HTN     *Hx of HLD  - Continue lipitor 40mg  daily    *Afib-flutter w/ RVR  *Hx of paroxysmal Afib  - Was taking diltiazem outpatient; previously held due to likely poor absorption and started on amiodarone in ICU.  - Cardiology consulted 3/28 for amiodarone plan:  - Continue PO amiodarone 200 mg daily through admission and on discharge (even with bradycardia, as they feel she is at her baseline rate s/p conversion)  - Amiodarone reduced to 100mg  3/30 per daughter preference and OK per cardiology  - Amiodarone increased to 150mg  4/1 due to elevated HR  - Cardiology recommending stopping amiodarone 4/4  - Page closer to DC for follow-up. Can convert to DOAC as able. (Transitioned to lovenox as below)    Resp:   *Acute on chronic hypoxemia, hx COPD  *Pulmonary edema  *Pulmonary HTN   - Wean O2 as able  - Continue pulmonary toilet: OOB and IS   - Continue atrovent q6h, brovana BID   - CXR 4/2 with improved pleural effusions  - CXR 4/5 with bibasilar atelectasis   - Diuresing PRN daily with goal of net -500 to -1L    *Pulmonary embolism, segmental  - CTA chest (3/4) filling defect in L anterior segmental artery    -  Heparin gtt transitioned to therapeutic lovenox 3/30  - Plan for DOAC closer to discharge    FEN/GI:   - Fluids: ML   - Replete lytes PRN  - N: Regular diet with food cut up in small pieces. Cal count in place. Supplements ordered.   - NGT removed 4/2 as patient was meeting calorie and feeding goals.   - Patient aware NGT may be replaced if unable to meet nutrition goals  - RD to discuss with patient and daughter today  - Severe Protein-Calorie Malnutrition in the context of acute illness or injury (03/10/24 0901)  Fat Loss: Moderate  Muscle Loss: Severe  Fluid Accumulation: Moderate  Malnutrition Score: 3  - PRN Zofran    *Constipation (resolved)  - Large stool burden visualized on CXRs  - Had small BM after suppository on 3/29  - SMOG and mineral oil vix corpak on 3/30 with no results  - SMOG repeated 3/31 with no results  - KUB 4/1 showed large amount of stool in colon and rectum  - Patient had multiple large BM 4/3  - Continue daily bowel regimen    *S/p ex-lap, reduction of parastomal hernia, small bowel resection with primary anastomosis, lysis of adhesions   - WTD dressing changes daily per nursing to midline incision    *Large hiatal hernia, chronic  - re-eval on CT chest/abd/pelvis 3/13 stable    GU/Renal:   - Adequate UOP.   - Cr WNL  - Ileal conduit viable with adequate UOP    *AKI on CKD (resolved)  - Cr improved  - Received CRRT x2  - Nephrology signed off  - Diuresis as needed    *H/o bladder cancer s/p radical cystectomy with ileal conduit (2012)  *Parastomal hernia  - Strict I&Os  - OK for no foley in stoma at this time per urology  - Adequate UOP from stoma    Heme  - Hgb stable    ID:   - Afebrile.  - WBC- No leukocytosis.  - C-Diff negative 3/26    Endocrine:   *Hyperglycemia, improved   *Hypoglycemia, 4/3  - NPH 15 units BID > 12 units BID d/t recent hypoglycemic episodes  - Regular SSI q6hr   - POCT increased to q4h    PPx:   Therapeutic lovenox    Dispo: Floor status  PT: 5XL  OT: 5XL  ST: 5X  Barriers to discharge:  long term feeding plan, O2 requirement  CM/SW assisting in discharge planning.   - Medically ready for dispo    Contact: [facility] Pg. [phone]  Ardis Krebs, PA      Objective     Vitals:   Temp:  [36.4 ??C (97.5 ??F)-36.8 ??C (98.2 ??F)] 36.6 ??C (97.9 ??F)  Heart Rate:  [47-63] 51  SpO2 Pulse:  [49-66] 60  Resp:  [10-24] 18  BP: (118-162)/(49-73) 162/73  MAP (mmHg):  [72-98] 98  SpO2:  [86 %-100 %] 93 %    Intake/Output last 24 hours:  I/O last 3 completed shifts:  In: 400 [P.O.:400]  Out: 2010 [Urine:2010]    Physical Exam:    -General:  Appropriate, comfortable and in no apparent distress.   -Neurological: Alert and oriented with appropriate conversation. Moves all 4 extremities spontaneously.   -ENT: EOMI  -Psych: speech appropriate, pleasant affect  -Cardiovascular: Regular rate and rhythm  -Pulmonary: Normal work of breathing. No accessory muscle use, currently on 1L via [place]   -Abdomen: Soft, non-distended. Appropriately tender to  palpation post- operatively. No rebound or guarding. Midline incision with island dressing place. 2 small openings packed.  No purulence.  -Genitourinary: Ileal conduit urostomy pink and moist with good UOP.  -Extremities: Warm, well perfused, normal skin turgor.       Data Review:    I have reviewed the labs and studies from the last 24 hours.    Imaging: Radiology studies were personally reviewed

## 2024-04-04 NOTE — Unmapped (Signed)
 [place] TRAUMA, ACUTE CARE, and GENERAL SURGERY   - Surgery Daily Progress Note -  04/04/2024     Admit Date: 02/26/2024, Hospital Day: 24  Hospital Service: [facility])  Attending: Sheralyn Dies, MD  [facility] - [facility]  General / Trauma Surgery - Trauma/CC     Assessment     Tammy Braun 83 y.o. female, PMH bladder cancer with cystectomy and ileal conduit, COPD, recent dx of afib, moderate-severe TR, CAD (high calcification score), T2DM presented to [facility] with projectile vomiting and abdominal pain, found to have SBO with transition point at level of ileostomy on CT A/P. OR on 02/28/24 for ex-lap, reduction of parastomal hernia, small bowel resection with primary anastomosis and lysis of adhesions.     Interval Events:  -02/26/24: Admitted to [facility] floor; urology consult for possible ileal conduit revision.   -02/27/24: Patient level of care escalated to step-down d/t increased oxygen needs   -02/28/24: Upgraded to ICU. Intubated at bed-side. OR for ex-lap, reduction of parastomal hernia, small bowel resection with primary anastomosis, lysis of adhesions, abdomen left open with ABThera in place.   -03/01/24: OR for attempt at abdominal closure. Left open due to desaturations during closure attempt. ABThera in place.  -03/03/24: RTOR for abdominal closure  -03/06/24: Vas cath placed, CRRT started   -03/09/24: Extubated to HFNC  -03/10/24: CRRT discontinued   -03/11/24: Re-intubated for hypoxic respiratory failure 2/2 mucus plug   -03/14/24 Extubated to HFNC   -03/15/24: Vas cath removed   -03/16/24: Placed on BiPap for complete collapse of L lung 2/2 probable mucus plugging  -03/19/24: Re-intubated for mucus plugging. Therapeutic bronch, BAL sent.   -03/22/24: Extubated to HFNC  -03/27/24: Transferred to stepdown      Plan     Neuro:   - Acute pain:Well controlled  - Tylenol  PRN  - at bedtime robaxin  (takes flexeril  at bedtime at home)    *Insomnia  - Melatonin PRN    *Schizoaffective disorder  - Continue Prolixin  monthly (last dose 3/14)     CV:   *Hx HTN   - HDS  - Holding home amlodipine  given low/normal pressures     *HFpEF, pulmonary HTN   - ECHO 3/12- EF 70%, severe pulmonary HTN     *Hx of HLD  - Continue lipitor  40mg  daily    *Afib-flutter w/ RVR:  *Hx of paroxysmal Afib  - Was taking diltiazem  outpatient; previously held due to likely poor absorption and started on amiodarone  in ICU.  - Consulted cardiology 3/28 for amiodarone  plan; recommend continuining PO amiodarone  200 mg daily through admission and upon discharge (even with bradycardia, as they feel she is at her baseline rate s/p conversion)  - Amiodarone  reduced to 100mg  3/30 per daughter preference and ok per cardiology  - Amiodarone  increased to 150mg  4/1 due to elevated HR  - Cardiology recommending stopping amiodarone  4/4  - Page closer to DC for follow-up. Can convert to DOAC as able. (Transitioned to lovenox  as below)    Resp:   *Acute on chronic hypoxemia, hx COPD  *Pulmonary edema  *Pulmonary HTN   - 02 requirement now 1L via [place], Wean supplemental oxygen as able, sat goal > 88%   - Continue pulmonary toliet: OOB and IS   - Continue atrovent  q6h, brovana  BID   - CXR 4/2 showed improved pleural effusions  - Diuresing prn daily with goal of net -500 to -1L    *Pulmonary embolism:   - 3/4 CTA chest: filling defect  in L anterior segmental artery    - Heparin  gtt  transitioned to therapeutic lovenox  3/30  - Plan for DOAC closer to discharge    Fen/GI:   - Diet: Regular diet with food cut up in small pieces.  - NGT removed 4/2 as patient was meeting calorie and feeding goals.   - Patient aware NGT may be replaced if unable to meet nutrition goals  - RD to discussed with patient and daughter (4/4); appreciate assistance  - Severe Protein-Calorie Malnutrition in the context of acute illness or injury (03/10/24 0901)  Fat Loss: Moderate  Muscle Loss: Severe  Fluid Accumulation: Moderate  Malnutrition Score: 3  - Fluids: ML   - Zofran  as needed for nausea   - Replete lytes prn: None needed today  - Bowel regimen: Miralax , senna    #Constipation (resolved)  - Large stool burden visualized on CXRs  - Had small BM after suppository on 3/29  - SMOG and mineral oil vix corpak on 3/30 with no results  - SMOG repeated 3/31 with no results  - KUB 4/1 showed large amount of stool in colon and rectum  - Patient had multiple large BM 4/3  - Continue daily bowel regimen    #S/p ex-lap, reduction of parastomal hernia, small bowel resection with primary anastomosis, lysis of adhesions   - WTD dressing changes daily per nursing to midline incision    #Large hiatal hernia, chronic  - re-eval on CT chest/abd/pelvis 3/13 stable    GU:   - Adequate UOP.   - Cr WNL  - Voiding spontaneously--> urostomy     *AKI on CKD (resolved)  - Cr improved  - Received CRRT x2  - Nephrology signed off  - Diuresis as needed    *H/o bladder cancer s/p radical cystectomy with ileal conduit (2012):  *Parastomal hernia:  - Strict I&Os  - Ok for no foley in stoma at this time per urology  - Adequate UOP from stoma    Heme  - Hgb stable    ID:   - Afebrile.  - WBC- No leukocytosis.  - C-Diff negative 3/26    Endocrine:   *Hyperglycemia, improved   *Hypoglycemia, 4/3  - NPH 12 units BID   - Regular SSI q6hr   - POCT q4h    PPx:   Therapeutic lovenox     Dispo: Floor status  PT: 5XL  OT: 5XL  ST: 5X  Barriers to discharge:  long term feeding plan, O2 requirement, dispo  CM/SW assisting in discharge planning.     Contact: [facility] Stepdown [phone]  Francenia Ingle, PA        Subjective   Patient sitting up in bed this am; no complaints. Patient was able to get out of bed yesterday and use wheelchair.     Objective     Vitals:   Temp:  [36.6 ??C (97.9 ??F)-37.1 ??C (98.8 ??F)] 36.8 ??C (98.2 ??F)  Heart Rate:  [49-69] 54  SpO2 Pulse:  [47-61] 47  Resp:  [14-22] 21  BP: (120-143)/(58-86) 140/86  MAP (mmHg):  [81-102] 99  FiO2 (%):  [24 %] 24 %  SpO2:  [86 %-100 %] 98 %    Intake/Output last 24 hours:  I/O last 3 completed shifts:  In: 630 [P.O.:600; I.V.:30]  Out: 2275 [Urine:2275]    Physical Exam:    -General:  Appropriate, comfortable and in no apparent distress.   -Neurological: Alert and oriented with appropriate conversation. Moves all 4 extremities  spontaneously.   -ENT: EOMI  -Psych: speech appropriate, pleasant affect  -Cardiovascular: Regular rate and rhythm  -Pulmonary: Normal work of breathing. No accessory muscle use, currently on 1L via [place]   -Abdomen: Soft, non-distended. Appropriately tender to palpation post- operatively. No rebound or guarding. Midline incision with island dressing place. 2 small openings packed.  No purulence.  -Genitourinary: Voiding spontaneously.  Ileal conduit urostomy pink and moist with good UOP.  -Extremities: Warm, well perfused, normal skin turgor.       Data Review:    I have reviewed the labs and studies from the last 24 hours.    Imaging: Radiology studies were personally reviewed       ATTENDING ATTESTATION:  I was the supervising physician in the delivery of the service.  ______________________________

## 2024-04-05 MED ADMIN — enoxaparin (LOVENOX) syringe 50 mg: 50 mg | SUBCUTANEOUS | @ 03:00:00

## 2024-04-05 MED ADMIN — atorvastatin (LIPITOR) tablet 40 mg: 40 mg | ORAL | @ 14:00:00

## 2024-04-05 MED ADMIN — acetaminophen (TYLENOL) tablet 1,000 mg: 1000 mg | ORAL | @ 18:00:00

## 2024-04-05 MED ADMIN — arformoterol (BROVANA) nebulizer solution 15 mcg/2 mL: 15 ug | RESPIRATORY_TRACT | @ 15:00:00

## 2024-04-05 MED ADMIN — methocarbamol (ROBAXIN) tablet 500 mg: 500 mg | ORAL | @ 03:00:00

## 2024-04-05 MED ADMIN — enoxaparin (LOVENOX) syringe 50 mg: 50 mg | SUBCUTANEOUS | @ 14:00:00

## 2024-04-05 MED ADMIN — acetaminophen (TYLENOL) tablet 1,000 mg: 1000 mg | ORAL | @ 03:00:00

## 2024-04-05 MED ADMIN — insulin NPH (HumuLIN,NovoLIN) injection 12 Units: 12 [IU] | SUBCUTANEOUS | @ 22:00:00

## 2024-04-05 MED ADMIN — acetaminophen (TYLENOL) tablet 1,000 mg: 1000 mg | ORAL | @ 14:00:00

## 2024-04-05 MED ADMIN — carboxymethylcellulose sodium (THERATEARS) 0.25 % ophthalmic solution 2 drop: 2 [drp] | OPHTHALMIC | @ 15:00:00

## 2024-04-05 MED ADMIN — ipratropium (ATROVENT) 0.02 % nebulizer solution 500 mcg: 500 ug | RESPIRATORY_TRACT | @ 22:00:00

## 2024-04-05 MED ADMIN — ipratropium (ATROVENT) 0.02 % nebulizer solution 500 mcg: 500 ug | RESPIRATORY_TRACT | @ 03:00:00

## 2024-04-05 MED ADMIN — oxyCODONE (ROXICODONE) immediate release tablet 5 mg: 5 mg | ORAL | @ 16:00:00 | Stop: 2024-04-19

## 2024-04-05 MED ADMIN — ipratropium (ATROVENT) 0.02 % nebulizer solution 500 mcg: 500 ug | RESPIRATORY_TRACT | @ 14:00:00

## 2024-04-05 MED ADMIN — insulin lispro (HumaLOG) injection 0-20 Units: 0-20 [IU] | SUBCUTANEOUS | @ 22:00:00

## 2024-04-05 MED ADMIN — pregabalin (LYRICA) capsule 50 mg: 50 mg | ORAL | @ 09:00:00

## 2024-04-05 MED ADMIN — pregabalin (LYRICA) capsule 50 mg: 50 mg | ORAL | @ 18:00:00

## 2024-04-05 MED ADMIN — polyethylene glycol (MIRALAX) packet 17 g: 17 g | ORAL | @ 14:00:00

## 2024-04-05 MED ADMIN — arformoterol (BROVANA) nebulizer solution 15 mcg/2 mL: 15 ug | RESPIRATORY_TRACT | @ 03:00:00

## 2024-04-05 NOTE — Unmapped (Signed)
 Pt A&Ox4. VSS on 2L oxygen via [place].  MD paged for PRN pain medication to be added to regimen.  Urostomy intact with good UOP overnight.  Midline dressing C/D/I.  2 hour turning schedule maintained overnight.  Telemetry monitored.  Contact precautions maintained.  Universal precautions maintained, bed low and locked, call bell within reach.  No other acute needs overnight.        Problem: Adult Inpatient Plan of Care  Goal: Plan of Care Review  Outcome: Ongoing - Unchanged  Goal: Patient-Specific Goal (Individualized)  Outcome: Ongoing - Unchanged  Goal: Absence of Hospital-Acquired Illness or Injury  Outcome: Ongoing - Unchanged  Intervention: Identify and Manage Fall Risk  Recent Flowsheet Documentation  Taken 04/05/2024 0000 by Orren Blades, RN  Safety Interventions:   fall reduction program maintained   low bed   nonskid shoes/slippers when out of bed  Taken 04/04/2024 2200 by CochraneHeidi Llamas, RN  Safety Interventions:   fall reduction program maintained   low bed   nonskid shoes/slippers when out of bed  Taken 04/04/2024 2000 by Orren Blades, RN  Safety Interventions:   fall reduction program maintained   low bed   nonskid shoes/slippers when out of bed  Intervention: Prevent Skin Injury  Recent Flowsheet Documentation  Taken 04/05/2024 0000 by Orren Blades, RN  Positioning for Skin: Right  Skin Protection: incontinence pads utilized  Taken 04/04/2024 2200 by Orren Blades, RN  Positioning for Skin: Supine/Back  Skin Protection: incontinence pads utilized  Taken 04/04/2024 2000 by Orren Blades, RN  Positioning for Skin: Left  Skin Protection: incontinence pads utilized  Intervention: Prevent and Manage VTE (Venous Thromboembolism) Risk  Recent Flowsheet Documentation  Taken 04/05/2024 0000 by Orren Blades, RN  Anti-Embolism Device Type: SCD, Knee  Anti-Embolism Device Status: On  Anti-Embolism Device Location: BLE  Taken 04/04/2024 2200 by CochraneHeidi Llamas, RN  Anti-Embolism Device Type: SCD, Knee  Anti-Embolism Device Status: On  Anti-Embolism Device Location: BLE  Taken 04/04/2024 2000 by Orren Blades, RN  Anti-Embolism Device Type: SCD, Knee  Anti-Embolism Device Status: On  Anti-Embolism Device Location: BLE  Goal: Optimal Comfort and Wellbeing  Outcome: Ongoing - Unchanged  Goal: Readiness for Transition of Care  Outcome: Ongoing - Unchanged  Goal: Rounds/Family Conference  Outcome: Ongoing - Unchanged     Problem: Infection  Goal: Absence of Infection Signs and Symptoms  Outcome: Ongoing - Unchanged     Problem: Fall Injury Risk  Goal: Absence of Fall and Fall-Related Injury  Outcome: Ongoing - Unchanged  Intervention: Promote Injury-Free Environment  Recent Flowsheet Documentation  Taken 04/05/2024 0000 by Orren Blades, RN  Safety Interventions:   fall reduction program maintained   low bed   nonskid shoes/slippers when out of bed  Taken 04/04/2024 2200 by Orren Blades, RN  Safety Interventions:   fall reduction program maintained   low bed   nonskid shoes/slippers when out of bed  Taken 04/04/2024 2000 by Orren Blades, RN  Safety Interventions:   fall reduction program maintained   low bed   nonskid shoes/slippers when out of bed     Problem: Wound  Goal: Optimal Coping  Outcome: Ongoing - Unchanged  Goal: Optimal Functional Ability  Outcome: Ongoing - Unchanged  Goal: Absence of Infection Signs and Symptoms  Outcome: Ongoing - Unchanged  Goal: Improved Oral Intake  Outcome: Ongoing - Unchanged  Goal: Optimal Pain Control and Function  Outcome: Ongoing - Unchanged  Goal: Skin Health and Integrity  Outcome: Ongoing - Unchanged  Intervention:  Optimize Skin Protection  Recent Flowsheet Documentation  Taken 04/05/2024 0000 by CochraneHeidi Llamas, RN  Pressure Reduction Techniques: frequent weight shift encouraged  Head of Bed (HOB) Positioning: HOB at 20-30 degrees  Pressure Reduction Devices: pressure-redistributing mattress utilized  Skin Protection: incontinence pads utilized  Taken 04/04/2024 2200 by Reuben Knoblock, Heidi Llamas, RN  Pressure Reduction Techniques: frequent weight shift encouraged  Head of Bed (HOB) Positioning: HOB at 20-30 degrees  Pressure Reduction Devices: pressure-redistributing mattress utilized  Skin Protection: incontinence pads utilized  Taken 04/04/2024 2000 by Orren Blades, RN  Pressure Reduction Techniques:   frequent weight shift encouraged   heels elevated off bed  Head of Bed (HOB) Positioning: HOB at 20-30 degrees  Pressure Reduction Devices: pressure-redistributing mattress utilized  Skin Protection: incontinence pads utilized  Goal: Optimal Wound Healing  Outcome: Ongoing - Unchanged     Problem: Skin Injury Risk Increased  Goal: Skin Health and Integrity  Outcome: Ongoing - Unchanged  Intervention: Optimize Skin Protection  Recent Flowsheet Documentation  Taken 04/05/2024 0000 by Orren Blades, RN  Pressure Reduction Techniques: frequent weight shift encouraged  Head of Bed (HOB) Positioning: HOB at 20-30 degrees  Pressure Reduction Devices: pressure-redistributing mattress utilized  Skin Protection: incontinence pads utilized  Taken 04/04/2024 2200 by Kiet Geer, Heidi Llamas, RN  Pressure Reduction Techniques: frequent weight shift encouraged  Head of Bed (HOB) Positioning: HOB at 20-30 degrees  Pressure Reduction Devices: pressure-redistributing mattress utilized  Skin Protection: incontinence pads utilized  Taken 04/04/2024 2000 by Orren Blades, RN  Pressure Reduction Techniques:   frequent weight shift encouraged   heels elevated off bed  Head of Bed (HOB) Positioning: HOB at 20-30 degrees  Pressure Reduction Devices: pressure-redistributing mattress utilized  Skin Protection: incontinence pads utilized     Problem: Self-Care Deficit  Goal: Improved Ability to Complete Activities of Daily Living  Outcome: Ongoing - Unchanged     Problem: Malnutrition  Goal: Improved Nutritional Intake  Outcome: Ongoing - Unchanged     Problem: Breathing Pattern Ineffective  Goal: Effective Breathing Pattern  Outcome: Ongoing - Unchanged  Intervention: Promote Improved Breathing Pattern  Recent Flowsheet Documentation  Taken 04/05/2024 0000 by Orren Blades, RN  Head of Bed [facility]) Positioning: HOB at 20-30 degrees  Taken 04/04/2024 2200 by CochraneHeidi Llamas, RN  Head of Bed [facility]) Positioning: HOB at 20-30 degrees  Taken 04/04/2024 2000 by Orren Blades, RN  Head of Bed [facility]) Positioning: HOB at 20-30 degrees

## 2024-04-05 NOTE — Unmapped (Signed)
 Pt alert and oriented x4. VSS. No acute changes this shift. Pain managed with PRN dose of oxy. PIV C/D/I. Abdominal dressing changed per order this shift. Urostomy intact. Universal safety precautions maintained.  Problem: Adult Inpatient Plan of Care  Goal: Plan of Care Review  Outcome: Progressing  Goal: Patient-Specific Goal (Individualized)  Outcome: Progressing  Goal: Absence of Hospital-Acquired Illness or Injury  Outcome: Progressing  Intervention: Identify and Manage Fall Risk  Recent Flowsheet Documentation  Taken 04/05/2024 1800 by Romeo Co, RN  Safety Interventions: fall reduction program maintained  Taken 04/05/2024 1600 by Romeo Co, RN  Safety Interventions: fall reduction program maintained  Taken 04/05/2024 1400 by Romeo Co, RN  Safety Interventions: fall reduction program maintained  Taken 04/05/2024 1200 by Romeo Co, RN  Safety Interventions: fall reduction program maintained  Taken 04/05/2024 1000 by Romeo Co, RN  Safety Interventions: fall reduction program maintained  Taken 04/05/2024 0800 by Romeo Co, RN  Safety Interventions: fall reduction program maintained  Intervention: Prevent Skin Injury  Recent Flowsheet Documentation  Taken 04/05/2024 1800 by Romeo Co, RN  Positioning for Skin: Supine/Back  Taken 04/05/2024 1600 by Romeo Co, RN  Positioning for Skin: Supine/Back  Taken 04/05/2024 1400 by Romeo Co, RN  Positioning for Skin: Supine/Back  Taken 04/05/2024 1200 by Romeo Co, RN  Positioning for Skin: Supine/Back  Taken 04/05/2024 1000 by Romeo Co, RN  Positioning for Skin: Supine/Back  Taken 04/05/2024 0800 by Romeo Co, RN  Positioning for Skin: Supine/Back  Intervention: Prevent and Manage VTE (Venous Thromboembolism) Risk  Recent Flowsheet Documentation  Taken 04/05/2024 1800 by Romeo Co, RN  Anti-Embolism Device Status: Refused  Taken 04/05/2024 1600 by Romeo Co, RN  Anti-Embolism Device Status: Refused  Taken 04/05/2024 1400 by Romeo Co, RN  Anti-Embolism Device Status: Refused  Taken 04/05/2024 1200 by Romeo Co, RN  Anti-Embolism Device Status: Refused  Taken 04/05/2024 1000 by Romeo Co, RN  Anti-Embolism Device Status: Refused  Taken 04/05/2024 0800 by Romeo Co, RN  Anti-Embolism Device Status: Refused  Goal: Optimal Comfort and Wellbeing  Outcome: Progressing  Goal: Readiness for Transition of Care  Outcome: Progressing  Goal: Rounds/Family Conference  Outcome: Progressing     Problem: Infection  Goal: Absence of Infection Signs and Symptoms  Outcome: Progressing     Problem: Fall Injury Risk  Goal: Absence of Fall and Fall-Related Injury  Outcome: Progressing  Intervention: Promote Injury-Free Environment  Recent Flowsheet Documentation  Taken 04/05/2024 1800 by Romeo Co, RN  Safety Interventions: fall reduction program maintained  Taken 04/05/2024 1600 by Romeo Co, RN  Safety Interventions: fall reduction program maintained  Taken 04/05/2024 1400 by Romeo Co, RN  Safety Interventions: fall reduction program maintained  Taken 04/05/2024 1200 by Romeo Co, RN  Safety Interventions: fall reduction program maintained  Taken 04/05/2024 1000 by Romeo Co, RN  Safety Interventions: fall reduction program maintained  Taken 04/05/2024 0800 by Romeo Co, RN  Safety Interventions: fall reduction program maintained     Problem: Wound  Goal: Optimal Coping  Outcome: Progressing  Goal: Optimal Functional Ability  Outcome: Progressing  Goal: Absence of Infection Signs and Symptoms  Outcome: Progressing  Goal: Improved Oral Intake  Outcome: Progressing  Goal: Optimal Pain Control and Function  Outcome: Progressing  Goal: Skin Health and Integrity  Outcome: Progressing  Intervention: Optimize Skin Protection  Recent Flowsheet Documentation  Taken 04/05/2024 1800 by Romeo Co, RN  Pressure Reduction Techniques: frequent weight shift encouraged  Taken 04/05/2024 1600 by Romeo Co, RN  Pressure Reduction Techniques: frequent weight shift encouraged  Taken 04/05/2024 1400 by Romeo Co, RN  Pressure Reduction Techniques: frequent weight shift encouraged  Taken 04/05/2024 1200 by Romeo Co, RN  Pressure Reduction Techniques: frequent weight shift encouraged  Taken 04/05/2024 1000 by Romeo Co, RN  Pressure Reduction Techniques: frequent weight shift encouraged  Taken 04/05/2024 0800 by Romeo Co, RN  Pressure Reduction Techniques: frequent weight shift encouraged  Goal: Optimal Wound Healing  Outcome: Progressing     Problem: Skin Injury Risk Increased  Goal: Skin Health and Integrity  Outcome: Progressing  Intervention: Optimize Skin Protection  Recent Flowsheet Documentation  Taken 04/05/2024 1800 by Romeo Co, RN  Pressure Reduction Techniques: frequent weight shift encouraged  Taken 04/05/2024 1600 by Romeo Co, RN  Pressure Reduction Techniques: frequent weight shift encouraged  Taken 04/05/2024 1400 by Romeo Co, RN  Pressure Reduction Techniques: frequent weight shift encouraged  Taken 04/05/2024 1200 by Romeo Co, RN  Pressure Reduction Techniques: frequent weight shift encouraged  Taken 04/05/2024 1000 by Romeo Co, RN  Pressure Reduction Techniques: frequent weight shift encouraged  Taken 04/05/2024 0800 by Romeo Co, RN  Pressure Reduction Techniques: frequent weight shift encouraged     Problem: Self-Care Deficit  Goal: Improved Ability to Complete Activities of Daily Living  Outcome: Progressing     Problem: Malnutrition  Goal: Improved Nutritional Intake  Outcome: Progressing     Problem: Breathing Pattern Ineffective  Goal: Effective Breathing Pattern  Outcome: Progressing

## 2024-04-06 LAB — CBC
HEMATOCRIT: 27.4 % — ABNORMAL LOW (ref 34.0–44.0)
HEMOGLOBIN: 8.5 g/dL — ABNORMAL LOW (ref 11.3–14.9)
MEAN CORPUSCULAR HEMOGLOBIN CONC: 31 g/dL — ABNORMAL LOW (ref 32.0–36.0)
MEAN CORPUSCULAR HEMOGLOBIN: 28 pg (ref 25.9–32.4)
MEAN CORPUSCULAR VOLUME: 90.2 fL (ref 77.6–95.7)
MEAN PLATELET VOLUME: 8.8 fL (ref 6.8–10.7)
PLATELET COUNT: 310 10*9/L (ref 150–450)
RED BLOOD CELL COUNT: 3.04 10*12/L — ABNORMAL LOW (ref 3.95–5.13)
RED CELL DISTRIBUTION WIDTH: 21 % — ABNORMAL HIGH (ref 12.2–15.2)
WBC ADJUSTED: 6.1 10*9/L (ref 3.6–11.2)

## 2024-04-06 LAB — BASIC METABOLIC PANEL
ANION GAP: 8 mmol/L (ref 5–14)
BLOOD UREA NITROGEN: 33 mg/dL — ABNORMAL HIGH (ref 9–23)
BUN / CREAT RATIO: 38
CALCIUM: 8.2 mg/dL — ABNORMAL LOW (ref 8.7–10.4)
CHLORIDE: 104 mmol/L (ref 98–107)
CO2: 26 mmol/L (ref 20.0–31.0)
CREATININE: 0.87 mg/dL (ref 0.55–1.02)
EGFR CKD-EPI (2021) FEMALE: 67 mL/min/1.73m2 (ref >=60)
GLUCOSE RANDOM: 91 mg/dL (ref 70–179)
POTASSIUM: 5.2 mmol/L — ABNORMAL HIGH (ref 3.4–4.8)
SODIUM: 138 mmol/L (ref 135–145)

## 2024-04-06 LAB — PHOSPHORUS: PHOSPHORUS: 3.1 mg/dL (ref 2.4–5.1)

## 2024-04-06 LAB — MAGNESIUM: MAGNESIUM: 2 mg/dL (ref 1.6–2.6)

## 2024-04-06 MED ADMIN — insulin lispro (HumaLOG) injection 0-20 Units: 0-20 [IU] | SUBCUTANEOUS | @ 22:00:00

## 2024-04-06 MED ADMIN — polyethylene glycol (MIRALAX) packet 17 g: 17 g | ORAL | @ 13:00:00

## 2024-04-06 MED ADMIN — senna (SENOKOT) tablet 1 tablet: 1 | ORAL | @ 02:00:00

## 2024-04-06 MED ADMIN — ipratropium (ATROVENT) 0.02 % nebulizer solution 500 mcg: 500 ug | RESPIRATORY_TRACT | @ 02:00:00

## 2024-04-06 MED ADMIN — acetaminophen (TYLENOL) tablet 1,000 mg: 1000 mg | ORAL | @ 07:00:00

## 2024-04-06 MED ADMIN — atorvastatin (LIPITOR) tablet 40 mg: 40 mg | ORAL | @ 13:00:00

## 2024-04-06 MED ADMIN — ipratropium (ATROVENT) 0.02 % nebulizer solution 500 mcg: 500 ug | RESPIRATORY_TRACT | @ 13:00:00

## 2024-04-06 MED ADMIN — carboxymethylcellulose sodium (THERATEARS) 0.25 % ophthalmic solution 2 drop: 2 [drp] | OPHTHALMIC | @ 13:00:00

## 2024-04-06 MED ADMIN — ipratropium (ATROVENT) 0.02 % nebulizer solution 500 mcg: 500 ug | RESPIRATORY_TRACT | @ 22:00:00

## 2024-04-06 MED ADMIN — insulin NPH (HumuLIN,NovoLIN) injection 12 Units: 12 [IU] | SUBCUTANEOUS | @ 22:00:00

## 2024-04-06 MED ADMIN — arformoterol (BROVANA) nebulizer solution 15 mcg/2 mL: 15 ug | RESPIRATORY_TRACT | @ 12:00:00

## 2024-04-06 MED ADMIN — arformoterol (BROVANA) nebulizer solution 15 mcg/2 mL: 15 ug | RESPIRATORY_TRACT | @ 02:00:00

## 2024-04-06 MED ADMIN — enoxaparin (LOVENOX) syringe 50 mg: 50 mg | SUBCUTANEOUS | @ 02:00:00

## 2024-04-06 MED ADMIN — cholecalciferol (vitamin D3 25 mcg (1,000 units)) tablet 50 mcg: 50 ug | ORAL | @ 18:00:00

## 2024-04-06 MED ADMIN — enoxaparin (LOVENOX) syringe 50 mg: 50 mg | SUBCUTANEOUS | @ 13:00:00

## 2024-04-06 MED ADMIN — methocarbamol (ROBAXIN) tablet 500 mg: 500 mg | ORAL | @ 02:00:00

## 2024-04-06 MED ADMIN — acetaminophen (TYLENOL) tablet 1,000 mg: 1000 mg | ORAL | @ 19:00:00

## 2024-04-06 MED ADMIN — polyethylene glycol (MIRALAX) packet 17 g: 17 g | ORAL | @ 02:00:00

## 2024-04-06 MED ADMIN — acetaminophen (TYLENOL) tablet 1,000 mg: 1000 mg | ORAL | @ 13:00:00

## 2024-04-06 NOTE — Unmapped (Signed)
  TRAUMA, ACUTE CARE, and GENERAL SURGERY   - Surgery Daily Progress Note -  04/06/2024     Admit Date: 02/26/2024, Hospital Day: 53  Hospital Service: [facility])  Attending: [name], MD  [facility] - [facility]  General / Trauma Surgery - Trauma/CC     Assessment     Tammy Braun 83 y.o. female, PMH bladder cancer with cystectomy and ileal conduit, COPD, recent dx of afib, moderate-severe TR, CAD (high calcification score), T2DM presented to [facility] with projectile vomiting and abdominal pain, found to have SBO with transition point at level of ileostomy on CT A/P. OR on 02/28/24 for ex-lap, reduction of parastomal hernia, small bowel resection with primary anastomosis and lysis of adhesions.     Interval Events:  -02/26/24: Admitted to [facility] floor; urology consult for possible ileal conduit revision.   -02/27/24: Patient level of care escalated to step-down d/t increased oxygen needs   -02/28/24: Upgraded to ICU. Intubated at bed-side. OR for ex-lap, reduction of parastomal hernia, small bowel resection with primary anastomosis, lysis of adhesions, abdomen left open with ABThera in place.   -03/01/24: OR for attempt at abdominal closure. Left open due to desaturations during closure attempt. ABThera in place.  -03/03/24: RTOR for abdominal closure  -03/06/24: Vas cath placed, CRRT started   -03/09/24: Extubated to HFNC  -03/10/24: CRRT discontinued   -03/11/24: Re-intubated for hypoxic respiratory failure 2/2 mucus plug   -03/14/24 Extubated to HFNC   -03/15/24: Vas cath removed   -03/16/24: Placed on BiPap for complete collapse of L lung 2/2 probable mucus plugging  -03/19/24: Re-intubated for mucus plugging. Therapeutic bronch, BAL sent.   -03/22/24: Extubated to HFNC  -03/27/24: Transferred to stepdown  -04/03/24: Transferred to floor    Subjective   NAEO. VSS. Afebrile. Patient asking when she can leave this AM.     Plan     Neuro:   - PRN APAP    *Insomnia  - Melatonin PRN    *Schizoaffective disorder  - Continue Prolixin monthly (last dose 3/14)     CV:   *Hx HTN   - HDS  - Holding home amlodipine given low/normal pressures     *HFpEF, pulmonary HTN   - Echo (3/12) EF 70%, severe pulmonary HTN     *Hx of HLD  - Continue lipitor 40mg  daily    *Afib-flutter w/ RVR  *Hx of paroxysmal Afib  - Was taking diltiazem outpatient; previously held due to likely poor absorption and started on amiodarone in ICU.  - Cardiology consulted, appreciate assistance:    > Can discontinue amiodarone     > Resume DOAC as able    > Page on DC for follow-up     Resp:   *Acute on chronic hypoxemia, hx COPD  *Pulmonary edema  *Pulmonary HTN   - Continue pulmonary toilet: OOB and IS   - Continue atrovent q6h, brovana BID     *Pulmonary embolism, segmental  - CTA chest (3/4) filling defect in L anterior segmental artery    - Heparin gtt transitioned to therapeutic lovenox 3/30, stable  - Plan for DOAC closer to discharge    FEN/GI:   - Fluids: ML   - Replete lytes PRN  - N: Regular diet with food cut up in small pieces. Cal count in place. Supplements ordered.   - Patient aware NGT may be replaced if unable to meet nutrition goals  - Severe Protein-Calorie Malnutrition in the context of acute illness or injury (  03/10/24 0901)  Fat Loss: Moderate  Muscle Loss: Severe  Fluid Accumulation: Moderate  Malnutrition Score: 3  - PRN Zofran    *S/p ex-lap, reduction of parastomal hernia, small bowel resection with primary anastomosis, lysis of adhesions   - WTD dressing changes daily per nursing to midline incision    *Large hiatal hernia, chronic  - re-eval on CT chest/abd/pelvis 3/13 stable    GU/Renal:   - Adequate UOP.   - Cr WNL  - Ileal conduit viable with adequate UOP    *AKI on CKD (resolved)  - Cr stable now  - Diuresis as needed    *H/o bladder cancer s/p radical cystectomy with ileal conduit (2012)  *Parastomal hernia  - Strict I&Os  - Adequate UOP from stoma    Heme  - Hgb stable    ID:   - Afebrile.  - WBC- No leukocytosis.  - C-Diff negative 3/26    Endocrine:   - NPH 12 units BID  - Regular SSI q6hr   - POCT increased to q4h    PPx:   Therapeutic lovenox    Dispo: Floor status  PT: 5XL  OT: 5XL  ST: 5X  Barriers to discharge:  long term feeding plan, HH vs SNF  CM/SW assisting in discharge planning.   - Medically ready for dispo  - Family is refusing SNF referral despite PT and OT recommendations for continued skilled therapies daily. Informed family and patient that the patient will only ever get therapy every 2-3 days while inpatient, and that she will progress much faster at a rehab facility. Her daughter continues to voice that she will take her mother home and does not want to pursue placement in rehab facility.    Contact: [facility] Pg. [phone]  Mathias Solon, MD    Objective     Vitals:   Temp:  [36.4 ??C (97.5 ??F)-37 ??C (98.6 ??F)] 36.7 ??C (98.1 ??F)  Heart Rate:  [49-62] 55  Resp:  [18] 18  BP: (114-145)/(55-72) 125/56  MAP (mmHg):  [71-85] 75  SpO2:  [94 %-100 %] 97 %    Intake/Output last 24 hours:  I/O last 3 completed shifts:  In: 500 [P.O.:500]  Out: 1810 [Urine:1810]    Physical Exam:    -General:  Appropriate, comfortable and in no apparent distress.   -Neurological: Awakens to voice. Moves all 4 extremities spontaneously.   -Psych: speech appropriate, pleasant affect  -Cardiovascular: Regular rate and rhythm  -Pulmonary: Normal work of breathing. No accessory muscle use  -Abdomen: Soft, non-distended. Appropriately tender to palpation post- operatively. Midline incision with dressing place. 2 small openings packed.  No purulence.  -Genitourinary: Ileal conduit urostomy pink and moist with good UOP.  -Extremities: Warm, well perfused, normal skin turgor.     Data Review:    I have reviewed the labs and studies from the last 24 hours.    Imaging: Radiology studies were personally reviewed

## 2024-04-06 NOTE — Unmapped (Signed)
 No acute changes this shift. VSS on 1L [place]. A&O x4. Pt denied the need for PRN medications. Midline dressing changed per order. Pt up to the chair for a few hours, max 2-3 assist back to bed. 1 episode of bowel incontinence. Adequate output from urostomy. Tolerating a regular diet. Daughter at bedside throughout the day. No outstanding needs.     Problem: Adult Inpatient Plan of Care  Goal: Plan of Care Review  04/06/2024 1657 by Darus Engels, RN  Outcome: Progressing  04/06/2024 1657 by Darus Engels, RN  Outcome: Progressing  Goal: Patient-Specific Goal (Individualized)  04/06/2024 1657 by Darus Engels, RN  Outcome: Progressing  04/06/2024 1657 by Darus Engels, RN  Outcome: Progressing  Goal: Absence of Hospital-Acquired Illness or Injury  04/06/2024 1657 by Darus Engels, RN  Outcome: Progressing  04/06/2024 1657 by Darus Engels, RN  Outcome: Progressing  Intervention: Identify and Manage Fall Risk  Recent Flowsheet Documentation  Taken 04/06/2024 1600 by Darus Engels, RN  Safety Interventions:   fall reduction program maintained   low bed  Taken 04/06/2024 1400 by Darus Engels, RN  Safety Interventions:   fall reduction program maintained   low bed  Taken 04/06/2024 1200 by Darus Engels, RN  Safety Interventions:   fall reduction program maintained   low bed  Taken 04/06/2024 1000 by Darus Engels, RN  Safety Interventions:   fall reduction program maintained   low bed  Taken 04/06/2024 0800 by Darus Engels, RN  Safety Interventions:   fall reduction program maintained   low bed  Intervention: Prevent Skin Injury  Recent Flowsheet Documentation  Taken 04/06/2024 1600 by Darus Engels, RN  Positioning for Skin: Left  Taken 04/06/2024 1400 by Darus Engels, RN  Positioning for Skin: Sitting in Chair  Taken 04/06/2024 1200 by Darus Engels, RN  Positioning for Skin: Left  Taken 04/06/2024 1000 by Darus Engels, RN  Positioning for Skin: Right  Taken 04/06/2024 0800 by Darus Engels, RN  Positioning for Skin: Supine/Back  Intervention: Prevent and Manage VTE (Venous Thromboembolism) Risk  Recent Flowsheet Documentation  Taken 04/06/2024 1600 by Darus Engels, RN  Anti-Embolism Device Status: Refused  Taken 04/06/2024 1400 by Darus Engels, RN  Anti-Embolism Device Status: Refused  Taken 04/06/2024 1200 by Darus Engels, RN  Anti-Embolism Device Status: Refused  Taken 04/06/2024 1000 by Darus Engels, RN  Anti-Embolism Device Status: Refused  Taken 04/06/2024 0800 by Darus Engels, RN  Anti-Embolism Device Status: Refused  Goal: Optimal Comfort and Wellbeing  04/06/2024 1657 by Darus Engels, RN  Outcome: Progressing  04/06/2024 1657 by Darus Engels, RN  Outcome: Progressing  Goal: Readiness for Transition of Care  04/06/2024 1657 by Darus Engels, RN  Outcome: Progressing  04/06/2024 1657 by Darus Engels, RN  Outcome: Progressing  Goal: Rounds/Family Conference  04/06/2024 1657 by Darus Engels, RN  Outcome: Progressing  04/06/2024 1657 by Darus Engels, RN  Outcome: Progressing     Problem: Infection  Goal: Absence of Infection Signs and Symptoms  04/06/2024 1657 by Darus Engels, RN  Outcome: Progressing  04/06/2024 1657 by Darus Engels, RN  Outcome: Progressing     Problem: Fall Injury Risk  Goal: Absence of Fall and Fall-Related Injury  04/06/2024 1657 by Darus Engels, RN  Outcome: Progressing  04/06/2024 1657 by Darus Engels, RN  Outcome: Progressing  Intervention: Promote  Injury-Free Environment  Recent Flowsheet Documentation  Taken 04/06/2024 1600 by Darus Engels, RN  Safety Interventions:   fall reduction program maintained   low bed  Taken 04/06/2024 1400 by Darus Engels, RN  Safety Interventions:   fall reduction program maintained   low bed  Taken 04/06/2024 1200 by Darus Engels, RN  Safety Interventions:   fall reduction program maintained   low bed  Taken 04/06/2024 1000 by Darus Engels, RN  Safety Interventions:   fall reduction program maintained   low bed  Taken 04/06/2024 0800 by Darus Engels, RN  Safety Interventions:   fall reduction program maintained   low bed     Problem: Wound  Goal: Optimal Coping  04/06/2024 1657 by Darus Engels, RN  Outcome: Progressing  04/06/2024 1657 by Darus Engels, RN  Outcome: Progressing  Goal: Optimal Functional Ability  04/06/2024 1657 by Darus Engels, RN  Outcome: Progressing  04/06/2024 1657 by Darus Engels, RN  Outcome: Progressing  Goal: Absence of Infection Signs and Symptoms  04/06/2024 1657 by Darus Engels, RN  Outcome: Progressing  04/06/2024 1657 by Darus Engels, RN  Outcome: Progressing  Goal: Improved Oral Intake  04/06/2024 1657 by Darus Engels, RN  Outcome: Progressing  04/06/2024 1657 by Darus Engels, RN  Outcome: Progressing  Goal: Optimal Pain Control and Function  04/06/2024 1657 by Darus Engels, RN  Outcome: Progressing  04/06/2024 1657 by Darus Engels, RN  Outcome: Progressing  Goal: Skin Health and Integrity  04/06/2024 1657 by Darus Engels, RN  Outcome: Progressing  04/06/2024 1657 by Darus Engels, RN  Outcome: Progressing  Intervention: Optimize Skin Protection  Recent Flowsheet Documentation  Taken 04/06/2024 1600 by Darus Engels, RN  Pressure Reduction Techniques: frequent weight shift encouraged  Taken 04/06/2024 1400 by Darus Engels, RN  Pressure Reduction Techniques: frequent weight shift encouraged  Taken 04/06/2024 1200 by Darus Engels, RN  Pressure Reduction Techniques: frequent weight shift encouraged  Taken 04/06/2024 1000 by Darus Engels, RN  Pressure Reduction Techniques: frequent weight shift encouraged  Taken 04/06/2024 0800 by Darus Engels, RN  Pressure Reduction Techniques: frequent weight shift encouraged  Goal: Optimal Wound Healing  04/06/2024 1657 by Darus Engels, RN  Outcome: Progressing  04/06/2024 1657 by Darus Engels, RN  Outcome: Progressing     Problem: Skin Injury Risk Increased  Goal: Skin Health and Integrity  04/06/2024 1657 by Darus Engels, RN  Outcome: Progressing  04/06/2024 1657 by Darus Engels, RN  Outcome: Progressing  Intervention: Optimize Skin Protection  Recent Flowsheet Documentation  Taken 04/06/2024 1600 by Darus Engels, RN  Pressure Reduction Techniques: frequent weight shift encouraged  Taken 04/06/2024 1400 by Darus Engels, RN  Pressure Reduction Techniques: frequent weight shift encouraged  Taken 04/06/2024 1200 by Darus Engels, RN  Pressure Reduction Techniques: frequent weight shift encouraged  Taken 04/06/2024 1000 by Darus Engels, RN  Pressure Reduction Techniques: frequent weight shift encouraged  Taken 04/06/2024 0800 by Darus Engels, RN  Pressure Reduction Techniques: frequent weight shift encouraged     Problem: Self-Care Deficit  Goal: Improved Ability to Complete Activities of Daily Living  04/06/2024 1657 by Darus Engels, RN  Outcome: Progressing  04/06/2024 1657 by Darus Engels, RN  Outcome: Progressing     Problem: Malnutrition  Goal: Improved Nutritional Intake  04/06/2024 1657 by Darus Engels, RN  Outcome: Progressing  04/06/2024 1657 by Darus Engels, RN  Outcome: Progressing     Problem: Breathing Pattern Ineffective  Goal: Effective Breathing Pattern  04/06/2024 1657 by Darus Engels, RN  Outcome: Progressing  04/06/2024 1657 by Darus Engels, RN  Outcome: Progressing

## 2024-04-06 NOTE — Unmapped (Signed)
 VSS on 1L [place]. A&Ox4. PIV maintained. Pain managed with scheduled medications. Q2 turns maintained. [name] at bedside. Bed locked and low. Call light within reach. Purposeful rounding maintained.         Problem: Adult Inpatient Plan of Care  Goal: Plan of Care Review  Outcome: Ongoing - Unchanged  Goal: Patient-Specific Goal (Individualized)  Outcome: Ongoing - Unchanged  Goal: Absence of Hospital-Acquired Illness or Injury  Outcome: Ongoing - Unchanged  Intervention: Identify and Manage Fall Risk  Recent Flowsheet Documentation  Taken 04/05/2024 2200 by Cassandria Clever, RN  Safety Interventions:   low bed   lighting adjusted for tasks/safety   fall reduction program maintained  Taken 04/05/2024 2000 by Cassandria Clever, RN  Safety Interventions:   low bed   lighting adjusted for tasks/safety   fall reduction program maintained  Intervention: Prevent Skin Injury  Recent Flowsheet Documentation  Taken 04/05/2024 2200 by Cassandria Clever, RN  Positioning for Skin: Supine/Back  Taken 04/05/2024 2000 by Cassandria Clever, RN  Positioning for Skin: Supine/Back  Intervention: Prevent and Manage VTE (Venous Thromboembolism) Risk  Recent Flowsheet Documentation  Taken 04/05/2024 2200 by Cassandria Clever, RN  Anti-Embolism Device Type: SCD, Knee  Anti-Embolism Device Status: Refused  Anti-Embolism Device Location: BLE  Taken 04/05/2024 2000 by Cassandria Clever, RN  Anti-Embolism Device Type: SCD, Knee  Anti-Embolism Device Status: Refused  Anti-Embolism Device Location: BLE  Goal: Optimal Comfort and Wellbeing  Outcome: Ongoing - Unchanged  Goal: Readiness for Transition of Care  Outcome: Ongoing - Unchanged  Goal: Rounds/Family Conference  Outcome: Ongoing - Unchanged     Problem: Infection  Goal: Absence of Infection Signs and Symptoms  Outcome: Ongoing - Unchanged     Problem: Fall Injury Risk  Goal: Absence of Fall and Fall-Related Injury  Outcome: Ongoing - Unchanged  Intervention: Promote Injury-Free Environment  Recent Flowsheet Documentation  Taken 04/05/2024 2200 by Cassandria Clever, RN  Safety Interventions:   low bed   lighting adjusted for tasks/safety   fall reduction program maintained  Taken 04/05/2024 2000 by Cassandria Clever, RN  Safety Interventions:   low bed   lighting adjusted for tasks/safety   fall reduction program maintained     Problem: Wound  Goal: Optimal Coping  Outcome: Ongoing - Unchanged  Goal: Optimal Functional Ability  Outcome: Ongoing - Unchanged  Goal: Absence of Infection Signs and Symptoms  Outcome: Ongoing - Unchanged  Goal: Improved Oral Intake  Outcome: Ongoing - Unchanged  Goal: Optimal Pain Control and Function  Outcome: Ongoing - Unchanged  Goal: Skin Health and Integrity  Outcome: Ongoing - Unchanged  Intervention: Optimize Skin Protection  Recent Flowsheet Documentation  Taken 04/05/2024 2200 by Cassandria Clever, RN  Pressure Reduction Techniques: frequent weight shift encouraged  Pressure Reduction Devices: pressure-redistributing mattress utilized  Taken 04/05/2024 2000 by Cassandria Clever, RN  Pressure Reduction Techniques: frequent weight shift encouraged  Pressure Reduction Devices: pressure-redistributing mattress utilized  Goal: Optimal Wound Healing  Outcome: Ongoing - Unchanged     Problem: Skin Injury Risk Increased  Goal: Skin Health and Integrity  Outcome: Ongoing - Unchanged  Intervention: Optimize Skin Protection  Recent Flowsheet Documentation  Taken 04/05/2024 2200 by Cassandria Clever, RN  Pressure Reduction Techniques: frequent weight shift encouraged  Pressure Reduction Devices: pressure-redistributing mattress utilized  Taken 04/05/2024 2000 by Cassandria Clever, RN  Pressure Reduction Techniques: frequent weight shift encouraged  Pressure Reduction Devices: pressure-redistributing mattress utilized  Problem: Self-Care Deficit  Goal: Improved Ability to Complete Activities of Daily Living  Outcome: Ongoing - Unchanged     Problem: Malnutrition  Goal: Improved Nutritional Intake  Outcome: Ongoing - Unchanged     Problem: Breathing Pattern Ineffective  Goal: Effective Breathing Pattern  Outcome: Ongoing - Unchanged

## 2024-04-07 MED ADMIN — ipratropium (ATROVENT) 0.02 % nebulizer solution 500 mcg: 500 ug | RESPIRATORY_TRACT | @ 20:00:00

## 2024-04-07 MED ADMIN — arformoterol (BROVANA) nebulizer solution 15 mcg/2 mL: 15 ug | RESPIRATORY_TRACT | @ 13:00:00

## 2024-04-07 MED ADMIN — acetaminophen (TYLENOL) tablet 1,000 mg: 1000 mg | ORAL

## 2024-04-07 MED ADMIN — insulin lispro (HumaLOG) injection 0-20 Units: 0-20 [IU] | SUBCUTANEOUS | @ 16:00:00

## 2024-04-07 MED ADMIN — ipratropium (ATROVENT) 0.02 % nebulizer solution 500 mcg: 500 ug | RESPIRATORY_TRACT

## 2024-04-07 MED ADMIN — methocarbamol (ROBAXIN) tablet 500 mg: 500 mg | ORAL

## 2024-04-07 MED ADMIN — acetaminophen (TYLENOL) tablet 1,000 mg: 1000 mg | ORAL | @ 23:00:00

## 2024-04-07 MED ADMIN — ipratropium (ATROVENT) 0.02 % nebulizer solution 500 mcg: 500 ug | RESPIRATORY_TRACT | @ 13:00:00

## 2024-04-07 MED ADMIN — pregabalin (LYRICA) capsule 50 mg: 50 mg | ORAL | @ 11:00:00

## 2024-04-07 MED ADMIN — arformoterol (BROVANA) nebulizer solution 15 mcg/2 mL: 15 ug | RESPIRATORY_TRACT

## 2024-04-07 MED ADMIN — enoxaparin (LOVENOX) syringe 50 mg: 50 mg | SUBCUTANEOUS | @ 13:00:00

## 2024-04-07 MED ADMIN — senna (SENOKOT) tablet 1 tablet: 1 | ORAL

## 2024-04-07 MED ADMIN — polyethylene glycol (MIRALAX) packet 17 g: 17 g | ORAL | @ 13:00:00

## 2024-04-07 MED ADMIN — insulin lispro (HumaLOG) injection 0-20 Units: 0-20 [IU] | SUBCUTANEOUS | @ 02:00:00

## 2024-04-07 MED ADMIN — carboxymethylcellulose sodium (THERATEARS) 0.25 % ophthalmic solution 2 drop: 2 [drp] | OPHTHALMIC | @ 13:00:00

## 2024-04-07 MED ADMIN — acetaminophen (TYLENOL) tablet 1,000 mg: 1000 mg | ORAL | @ 11:00:00

## 2024-04-07 MED ADMIN — acetaminophen (TYLENOL) tablet 1,000 mg: 1000 mg | ORAL | @ 17:00:00

## 2024-04-07 MED ADMIN — oxyCODONE (ROXICODONE) immediate release tablet 5 mg: 5 mg | ORAL | @ 01:00:00 | Stop: 2024-04-19

## 2024-04-07 MED ADMIN — polyethylene glycol (MIRALAX) packet 17 g: 17 g | ORAL

## 2024-04-07 NOTE — Unmapped (Signed)
 Care assumed at 0830 this shift. VSS on RA this shift. Pt A&Ox4, intermittently forgetful but easily redirected. Daughter remains at bedside for entirety of shift. Urostomy appliance in place with adequate UOP. Crusting to sacrum performed by daughter. PIV removed, MD notified and pt now ok for no PIV. BG checked per order. Bed low and locked, call light in reach. Pt free of falls, purposeful rounding maintained.     Problem: Adult Inpatient Plan of Care  Goal: Plan of Care Review  Outcome: Ongoing - Unchanged  Goal: Patient-Specific Goal (Individualized)  Outcome: Ongoing - Unchanged  Goal: Absence of Hospital-Acquired Illness or Injury  Outcome: Ongoing - Unchanged  Intervention: Identify and Manage Fall Risk  Recent Flowsheet Documentation  Taken 04/07/2024 1400 by Annabell Key, RN  Safety Interventions:   low bed   lighting adjusted for tasks/safety   fall reduction program maintained  Taken 04/07/2024 1200 by Annabell Key, RN  Safety Interventions:   low bed   lighting adjusted for tasks/safety   fall reduction program maintained  Taken 04/07/2024 1000 by Annabell Key, RN  Safety Interventions:   low bed   lighting adjusted for tasks/safety   fall reduction program maintained  Intervention: Prevent Skin Injury  Recent Flowsheet Documentation  Taken 04/07/2024 1400 by Annabell Key, RN  Positioning for Skin: Supine/Back  Taken 04/07/2024 1200 by Annabell Key, RN  Positioning for Skin: Supine/Back  Taken 04/07/2024 1000 by Annabell Key, RN  Positioning for Skin: Supine/Back  Intervention: Prevent and Manage VTE (Venous Thromboembolism) Risk  Recent Flowsheet Documentation  Taken 04/07/2024 1400 by Annabell Key, RN  Anti-Embolism Device Type: SCD, Knee  Anti-Embolism Device Status: Refused  Anti-Embolism Device Location: BLE  Taken 04/07/2024 1200 by Annabell Key, RN  Anti-Embolism Device Type: SCD, Knee  Anti-Embolism Device Status: Refused  Anti-Embolism Device Location: BLE  Taken 04/07/2024 1000 by Annabell Key, RN  Anti-Embolism Device Type: SCD, Knee  Anti-Embolism Device Status: Refused  Anti-Embolism Device Location: BLE  Goal: Optimal Comfort and Wellbeing  Outcome: Ongoing - Unchanged  Goal: Readiness for Transition of Care  Outcome: Ongoing - Unchanged  Goal: Rounds/Family Conference  Outcome: Ongoing - Unchanged     Problem: Infection  Goal: Absence of Infection Signs and Symptoms  Outcome: Ongoing - Unchanged     Problem: Fall Injury Risk  Goal: Absence of Fall and Fall-Related Injury  Outcome: Ongoing - Unchanged  Intervention: Promote Injury-Free Environment  Recent Flowsheet Documentation  Taken 04/07/2024 1400 by Annabell Key, RN  Safety Interventions:   low bed   lighting adjusted for tasks/safety   fall reduction program maintained  Taken 04/07/2024 1200 by Annabell Key, RN  Safety Interventions:   low bed   lighting adjusted for tasks/safety   fall reduction program maintained  Taken 04/07/2024 1000 by Annabell Key, RN  Safety Interventions:   low bed   lighting adjusted for tasks/safety   fall reduction program maintained     Problem: Wound  Goal: Optimal Coping  Outcome: Ongoing - Unchanged  Goal: Optimal Functional Ability  Outcome: Ongoing - Unchanged  Goal: Absence of Infection Signs and Symptoms  Outcome: Ongoing - Unchanged  Goal: Improved Oral Intake  Outcome: Ongoing - Unchanged  Goal: Optimal Pain Control and Function  Outcome: Ongoing - Unchanged  Goal: Skin Health and Integrity  Outcome: Ongoing - Unchanged  Intervention: Optimize Skin Protection  Recent Flowsheet Documentation  Taken 04/07/2024 1400 by Annabell Key, RN  Pressure Reduction Techniques: frequent weight shift encouraged  Pressure Reduction Devices:  pressure-redistributing mattress utilized  Taken 04/07/2024 1200 by Annabell Key, RN  Pressure Reduction Techniques: frequent weight shift encouraged  Pressure Reduction Devices: pressure-redistributing mattress utilized  Taken 04/07/2024 1000 by Annabell Key, RN  Pressure Reduction Techniques: frequent weight shift encouraged  Pressure Reduction Devices: pressure-redistributing mattress utilized  Goal: Optimal Wound Healing  Outcome: Ongoing - Unchanged     Problem: Skin Injury Risk Increased  Goal: Skin Health and Integrity  Outcome: Ongoing - Unchanged  Intervention: Optimize Skin Protection  Recent Flowsheet Documentation  Taken 04/07/2024 1400 by Annabell Key, RN  Pressure Reduction Techniques: frequent weight shift encouraged  Pressure Reduction Devices: pressure-redistributing mattress utilized  Taken 04/07/2024 1200 by Annabell Key, RN  Pressure Reduction Techniques: frequent weight shift encouraged  Pressure Reduction Devices: pressure-redistributing mattress utilized  Taken 04/07/2024 1000 by Annabell Key, RN  Pressure Reduction Techniques: frequent weight shift encouraged  Pressure Reduction Devices: pressure-redistributing mattress utilized     Problem: Self-Care Deficit  Goal: Improved Ability to Complete Activities of Daily Living  Outcome: Ongoing - Unchanged     Problem: Malnutrition  Goal: Improved Nutritional Intake  Outcome: Ongoing - Unchanged     Problem: Breathing Pattern Ineffective  Goal: Effective Breathing Pattern  Outcome: Ongoing - Unchanged

## 2024-04-07 NOTE — Unmapped (Signed)
 VSS on RA. A&Ox4. PIV maintained. Pain managed with scheduled and PRN medications. [name] at bedside. Bed locked and low. Call light within reach. Purposeful rounding maintained.         Problem: Adult Inpatient Plan of Care  Goal: Plan of Care Review  Outcome: Ongoing - Unchanged  Goal: Patient-Specific Goal (Individualized)  Outcome: Ongoing - Unchanged  Goal: Absence of Hospital-Acquired Illness or Injury  Outcome: Ongoing - Unchanged  Intervention: Identify and Manage Fall Risk  Recent Flowsheet Documentation  Taken 04/06/2024 2200 by Cassandria Clever, RN  Safety Interventions:   low bed   lighting adjusted for tasks/safety   fall reduction program maintained  Taken 04/06/2024 2000 by Cassandria Clever, RN  Safety Interventions:   low bed   lighting adjusted for tasks/safety   fall reduction program maintained  Intervention: Prevent Skin Injury  Recent Flowsheet Documentation  Taken 04/06/2024 2200 by Cassandria Clever, RN  Positioning for Skin: Supine/Back  Taken 04/06/2024 2000 by Cassandria Clever, RN  Positioning for Skin: Supine/Back  Intervention: Prevent and Manage VTE (Venous Thromboembolism) Risk  Recent Flowsheet Documentation  Taken 04/06/2024 2200 by Cassandria Clever, RN  Anti-Embolism Device Type: SCD, Knee  Anti-Embolism Device Status: Refused  Anti-Embolism Device Location: BLE  Taken 04/06/2024 2000 by Cassandria Clever, RN  Anti-Embolism Device Type: SCD, Knee  Anti-Embolism Device Status: Refused  Anti-Embolism Device Location: BLE  Goal: Optimal Comfort and Wellbeing  Outcome: Ongoing - Unchanged  Goal: Readiness for Transition of Care  Outcome: Ongoing - Unchanged  Goal: Rounds/Family Conference  Outcome: Ongoing - Unchanged     Problem: Infection  Goal: Absence of Infection Signs and Symptoms  Outcome: Ongoing - Unchanged     Problem: Fall Injury Risk  Goal: Absence of Fall and Fall-Related Injury  Outcome: Ongoing - Unchanged  Intervention: Promote Injury-Free Environment  Recent Flowsheet Documentation  Taken 04/06/2024 2200 by Cassandria Clever, RN  Safety Interventions:   low bed   lighting adjusted for tasks/safety   fall reduction program maintained  Taken 04/06/2024 2000 by Cassandria Clever, RN  Safety Interventions:   low bed   lighting adjusted for tasks/safety   fall reduction program maintained     Problem: Wound  Goal: Optimal Coping  Outcome: Ongoing - Unchanged  Goal: Optimal Functional Ability  Outcome: Ongoing - Unchanged  Goal: Absence of Infection Signs and Symptoms  Outcome: Ongoing - Unchanged  Goal: Improved Oral Intake  Outcome: Ongoing - Unchanged  Goal: Optimal Pain Control and Function  Outcome: Ongoing - Unchanged  Goal: Skin Health and Integrity  Outcome: Ongoing - Unchanged  Intervention: Optimize Skin Protection  Recent Flowsheet Documentation  Taken 04/06/2024 2200 by Cassandria Clever, RN  Pressure Reduction Techniques: frequent weight shift encouraged  Pressure Reduction Devices: pressure-redistributing mattress utilized  Taken 04/06/2024 2000 by Cassandria Clever, RN  Pressure Reduction Techniques: frequent weight shift encouraged  Pressure Reduction Devices: pressure-redistributing mattress utilized  Goal: Optimal Wound Healing  Outcome: Ongoing - Unchanged     Problem: Skin Injury Risk Increased  Goal: Skin Health and Integrity  Outcome: Ongoing - Unchanged  Intervention: Optimize Skin Protection  Recent Flowsheet Documentation  Taken 04/06/2024 2200 by Cassandria Clever, RN  Pressure Reduction Techniques: frequent weight shift encouraged  Pressure Reduction Devices: pressure-redistributing mattress utilized  Taken 04/06/2024 2000 by Cassandria Clever, RN  Pressure Reduction Techniques: frequent weight shift encouraged  Pressure Reduction Devices: pressure-redistributing mattress utilized     Problem:  Self-Care Deficit  Goal: Improved Ability to Complete Activities of Daily Living  Outcome: Ongoing - Unchanged     Problem: Malnutrition  Goal: Improved Nutritional Intake  Outcome: Ongoing - Unchanged     Problem: Breathing Pattern Ineffective  Goal: Effective Breathing Pattern  Outcome: Ongoing - Unchanged

## 2024-04-08 MED ADMIN — ipratropium (ATROVENT) 0.02 % nebulizer solution 500 mcg: 500 ug | RESPIRATORY_TRACT | @ 16:00:00

## 2024-04-08 MED ADMIN — ipratropium (ATROVENT) 0.02 % nebulizer solution 500 mcg: 500 ug | RESPIRATORY_TRACT | @ 01:00:00

## 2024-04-08 MED ADMIN — carboxymethylcellulose sodium (THERATEARS) 0.25 % ophthalmic solution 2 drop: 2 [drp] | OPHTHALMIC | @ 12:00:00

## 2024-04-08 MED ADMIN — cholecalciferol (vitamin D3 25 mcg (1,000 units)) tablet 50 mcg: 50 ug | ORAL | @ 18:00:00

## 2024-04-08 MED ADMIN — methocarbamol (ROBAXIN) tablet 500 mg: 500 mg | ORAL | @ 02:00:00

## 2024-04-08 MED ADMIN — insulin lispro (HumaLOG) injection 0-20 Units: 0-20 [IU] | SUBCUTANEOUS | @ 04:00:00

## 2024-04-08 MED ADMIN — acetaminophen (TYLENOL) tablet 1,000 mg: 1000 mg | ORAL | @ 18:00:00 | Stop: 2024-04-08

## 2024-04-08 MED ADMIN — ipratropium (ATROVENT) 0.02 % nebulizer solution 500 mcg: 500 ug | RESPIRATORY_TRACT | @ 07:00:00

## 2024-04-08 MED ADMIN — insulin lispro (HumaLOG) injection 0-20 Units: 0-20 [IU] | SUBCUTANEOUS | @ 21:00:00

## 2024-04-08 MED ADMIN — arformoterol (BROVANA) nebulizer solution 15 mcg/2 mL: 15 ug | RESPIRATORY_TRACT | @ 12:00:00

## 2024-04-08 MED ADMIN — enoxaparin (LOVENOX) syringe 50 mg: 50 mg | SUBCUTANEOUS | @ 12:00:00

## 2024-04-08 MED ADMIN — insulin lispro (HumaLOG) injection 0-20 Units: 0-20 [IU] | SUBCUTANEOUS | @ 16:00:00

## 2024-04-08 MED ADMIN — insulin NPH (HumuLIN,NovoLIN) injection 10 Units: 10 [IU] | SUBCUTANEOUS | @ 12:00:00

## 2024-04-08 MED ADMIN — acetaminophen (TYLENOL) tablet 1,000 mg: 1000 mg | ORAL | @ 04:00:00 | Stop: 2024-04-08

## 2024-04-08 MED ADMIN — insulin NPH (HumuLIN,NovoLIN) injection 10 Units: 10 [IU] | SUBCUTANEOUS | @ 21:00:00

## 2024-04-08 MED ADMIN — ipratropium (ATROVENT) 0.02 % nebulizer solution 500 mcg: 500 ug | RESPIRATORY_TRACT | @ 21:00:00

## 2024-04-08 MED ADMIN — enoxaparin (LOVENOX) syringe 50 mg: 50 mg | SUBCUTANEOUS | @ 02:00:00

## 2024-04-08 MED ADMIN — arformoterol (BROVANA) nebulizer solution 15 mcg/2 mL: 15 ug | RESPIRATORY_TRACT | @ 01:00:00

## 2024-04-08 MED ADMIN — melatonin tablet 4.5 mg: 4.5 mg | ORAL | @ 02:00:00

## 2024-04-08 MED ADMIN — acetaminophen (TYLENOL) tablet 500 mg: 500 mg | ORAL | @ 21:00:00

## 2024-04-08 MED ADMIN — polyethylene glycol (MIRALAX) packet 17 g: 17 g | ORAL | @ 12:00:00

## 2024-04-08 MED ADMIN — senna (SENOKOT) tablet 1 tablet: 1 | ORAL | @ 02:00:00

## 2024-04-08 NOTE — Unmapped (Signed)
 [place] TRAUMA, ACUTE CARE, and GENERAL SURGERY   - Surgery Daily Progress Note -  04/08/2024     Admit Date: 02/26/2024, Hospital Day: 65  Hospital Service: Lam[facility] P[facility])  Attending: [name], MD  [facility] - [facility]  General / Trauma Surgery - Trauma/CC     Assessment     Tammy Braun 83 y.o. female, PMH bladder cancer with cystectomy and ileal conduit, COPD, recent dx of afib, moderate-severe TR, CAD (high calcification score), T2DM presented to [facility] with projectile vomiting and abdominal pain, found to have SBO with transition point at level of ileostomy on CT A/P. OR on 02/28/24 for ex-lap, reduction of parastomal hernia, small bowel resection with primary anastomosis and lysis of adhesions.     Interval Events:  -02/26/24: Admitted to [facility] floor; urology consult for possible ileal conduit revision.   -02/27/24: Patient level of care escalated to step-down d/t increased oxygen needs   -02/28/24: Upgraded to ICU. Intubated at bed-side. OR for ex-lap, reduction of parastomal hernia, small bowel resection with primary anastomosis, lysis of adhesions, abdomen left open with ABThera in place.   -03/01/24: OR for attempt at abdominal closure. Left open due to desaturations during closure attempt. ABThera in place.  -03/03/24: RTOR for abdominal closure  -03/06/24: Vas cath placed, CRRT started   -03/09/24: Extubated to HFNC  -03/10/24: CRRT discontinued   -03/11/24: Re-intubated for hypoxic respiratory failure 2/2 mucus plug   -03/14/24 Extubated to HFNC   -03/15/24: Vas cath removed   -03/16/24: Placed on BiPap for complete collapse of L lung 2/2 probable mucus plugging  -03/19/24: Re-intubated for mucus plugging. Therapeutic bronch, BAL sent.   -03/22/24: Extubated to HFNC  -03/27/24: Transferred to stepdown  -04/03/24: Transferred to floor    Subjective   1 episode of hypoglycemia overnight. Otherwise, VSS. On room air this AM. Asking to get to chair.    Plan     Neuro:   - PRN APAP    *Insomnia  - Melatonin PRN    *Schizoaffective disorder  - Continue Prolixin monthly (last dose 3/14)     CV:   *Hx HTN   - HDS  - Holding home amlodipine given low/normal pressures     *HFpEF, pulmonary HTN   - Echo (3/12) EF 70%, severe pulmonary HTN     *Hx of HLD  - Continue lipitor 40mg  daily    *Afib-flutter w/ RVR  *Hx of paroxysmal Afib  - Was taking diltiazem outpatient; previously held due to likely poor absorption and started on amiodarone in ICU.  - Cardiology consulted, appreciate assistance:    > Can discontinue amiodarone     > Resume DOAC as able    > Page on DC for follow-up     Resp:   *Acute on chronic hypoxemia, hx COPD  *Pulmonary edema  *Pulmonary HTN   - Continue pulmonary toilet: OOB and IS   - Continue atrovent q6h, brovana BID     *Pulmonary embolism, segmental  - CTA chest (3/4) filling defect in L anterior segmental artery    - Heparin gtt transitioned to therapeutic lovenox 3/30, stable  - Plan for DOAC closer to discharge    FEN/GI:   - Fluids: ML   - Replete lytes PRN  - N: Regular diet with food cut up in small pieces. Cal count in place. Supplements ordered.     > had entire ensure and about half of plate for breakfast.  - Severe Protein-Calorie Malnutrition in the context  of acute illness or injury (03/10/24 0901)  Fat Loss: Moderate  Muscle Loss: Severe  Fluid Accumulation: Moderate  Malnutrition Score: 3  - PRN Zofran    *S/p ex-lap, reduction of parastomal hernia, small bowel resection with primary anastomosis, lysis of adhesions   - WTD dressing changes daily per nursing to midline incision    *Large hiatal hernia, chronic  - re-eval on CT chest/abd/pelvis 3/13 stable    GU/Renal:   - Adequate UOP.   - Cr WNL  - Ileal conduit viable with adequate UOP    *AKI on CKD (resolved)  - Cr stable now  - Diuresis as needed    *H/o bladder cancer s/p radical cystectomy with ileal conduit (2012)  *Parastomal hernia  - Strict I&Os  - Adequate UOP from stoma    Heme  - Hgb stable    ID:   - Afebrile.  - WBC- No leukocytosis.  - C-Diff negative 3/26    Endocrine:   - Decrease NPH to 10 units BID  - Regular SSI q6hr   - POCT increased to q4h    PPx:   Therapeutic lovenox    Dispo: Floor status  PT: 5XL  OT: 5XL  ST: 5X  Barriers to discharge:  None  CM/SW assisting in discharge planning.   - Medically ready for dispo  - Family is refusing SNF referral despite PT and OT recommendations for continued skilled therapies daily. Informed family and patient that the patient will only ever get therapy every 2-3 days while inpatient, and that she will progress much faster at a rehab facility. Her daughter continues to voice that she will take her mother home and does not want to pursue placement in rehab facility.    Contact: [facility] Pg. [phone]  Mathias Solon, MD    Objective     Vitals:   Temp:  [36.7 ??C (98.1 ??F)-36.9 ??C (98.4 ??F)] 36.8 ??C (98.2 ??F)  Heart Rate:  [58-66] 66  Resp:  [18] 18  BP: (144-177)/(68-75) 167/69  MAP (mmHg):  [90-102] 99  SpO2:  [94 %-96 %] 94 %    Intake/Output last 24 hours:  I/O last 3 completed shifts:  In: 240 [P.O.:240]  Out: 2500 [Urine:2500]    Physical Exam:    -General:  Appropriate, comfortable and in no apparent distress.   -Neurological: Awakens to voice. Moves all 4 extremities spontaneously.   -Psych: speech appropriate, pleasant affect  -Cardiovascular: Regular rate and rhythm  -Pulmonary: Normal work of breathing. No accessory muscle use  -Abdomen: Soft, non-distended. Appropriately tender to palpation post- operatively. Midline incision with dressing place.  No purulence.  -Genitourinary: Ileal conduit urostomy pink and moist with good UOP.  -Extremities: Warm, well perfused, normal skin turgor.     Data Review:    I have reviewed the labs and studies from the last 24 hours.    Imaging: Radiology studies were personally reviewed

## 2024-04-08 NOTE — Unmapped (Signed)
 [place] TRAUMA, ACUTE CARE, and GENERAL SURGERY   - Surgery Daily Progress Note -  04/08/2024     Admit Date: 02/26/2024, Hospital Day: 4  Hospital Service: [facility])  Attending: [name], MD  [facility] - [facility]4  General / Trauma Surgery - Trauma/CC     Assessment     Tammy Braun 83 y.o. female, PMH bladder cancer with cystectomy and ileal conduit, COPD, recent dx of afib, moderate-severe TR, CAD (high calcification score), T2DM presented to [facility] with projectile vomiting and abdominal pain, found to have SBO with transition point at level of ileostomy on CT A/P. OR on 02/28/24 for ex-lap, reduction of parastomal hernia, small bowel resection with primary anastomosis and lysis of adhesions.     Interval Events:  -02/26/24: Admitted to [facility] floor; urology consult for possible ileal conduit revision.   -02/27/24: Patient level of care escalated to step-down d/t increased oxygen needs   -02/28/24: Upgraded to ICU. Intubated at bed-side. OR for ex-lap, reduction of parastomal hernia, small bowel resection with primary anastomosis, lysis of adhesions, abdomen left open with ABThera in place.   -03/01/24: OR for attempt at abdominal closure. Left open due to desaturations during closure attempt. ABThera in place.  -03/03/24: RTOR for abdominal closure  -03/06/24: Vas cath placed, CRRT started   -03/09/24: Extubated to HFNC  -03/10/24: CRRT discontinued   -03/11/24: Re-intubated for hypoxic respiratory failure 2/2 mucus plug   -03/14/24 Extubated to HFNC   -03/15/24: Vas cath removed   -03/16/24: Placed on BiPap for complete collapse of L lung 2/2 probable mucus plugging  -03/19/24: Re-intubated for mucus plugging. Therapeutic bronch, BAL sent.   -03/22/24: Extubated to HFNC  -03/27/24: Transferred to stepdown  -04/03/24: Transferred to floor    Subjective   NAEO. Denies pain this AM. VSS. Asking to go home.     Plan     Neuro:   - PRN APAP, discontinue lyrica     *Insomnia  - Melatonin PRN    *Schizoaffective disorder  - Continue Prolixin  monthly (last dose 3/14)     CV:   *Hx HTN   - HDS  - Holding home amlodipine  given low/normal pressures     *HFpEF, pulmonary HTN   - Echo (3/12) EF 70%, severe pulmonary HTN     *Hx of HLD  - Continue lipitor  40mg  daily    *Afib-flutter w/ RVR  *Hx of paroxysmal Afib  - Was taking diltiazem  outpatient; previously held due to likely poor absorption and started on amiodarone  in ICU.  - Cardiology consulted, appreciate assistance:    > Can discontinue amiodarone      > Resume DOAC as able    > Page on DC for follow-up     Resp:   *Acute on chronic hypoxemia, hx COPD  *Pulmonary edema  *Pulmonary HTN   - Continue pulmonary toilet: OOB and IS   - Continue atrovent  q6h, brovana  BID     *Pulmonary embolism, segmental  - CTA chest (3/4) filling defect in L anterior segmental artery    - Heparin  gtt transitioned to therapeutic lovenox  3/30, stable  - Plan for DOAC closer to discharge    FEN/GI:   - Fluids: ML   - Replete lytes PRN  - N: Regular diet with food cut up in small pieces. Cal count in place. Supplements ordered.     > had entire ensure and all of breakfast.  - Severe Protein-Calorie Malnutrition in the context of acute illness or injury (03/10/24 0901)  Fat Loss: Moderate  Muscle Loss: Severe  Fluid Accumulation: Moderate  Malnutrition Score: 3  - PRN Zofran     *S/p ex-lap, reduction of parastomal hernia, small bowel resection with primary anastomosis, lysis of adhesions   - WTD dressing changes daily per nursing to midline incision    *Large hiatal hernia, chronic  - re-eval on CT chest/abd/pelvis 3/13 stable    GU/Renal:   - Adequate UOP.   - Cr WNL  - Ileal conduit viable with adequate UOP    *AKI on CKD (resolved)  - Cr stable now  - Diuresis as needed    *H/o bladder cancer s/p radical cystectomy with ileal conduit (2012)  *Parastomal hernia  - Strict I&Os  - Adequate UOP from stoma    Heme  - Hgb stable    ID:   - Afebrile.  - WBC- No leukocytosis.  - C-Diff negative 3/26    Endocrine:   - Continue NPH 10 units BID  - Regular SSI q6hr   - POCT increased to q4h    PPx:   Therapeutic lovenox     Dispo: Floor status  PT: 5XL  OT: 5XL  ST: 5X  Barriers to discharge:  None  CM/SW assisting in discharge planning.   - Medically ready for dispo, CM assisting with SNF placement vs. home    Contact: [facility]4 Pg. [phone]  Mathias Solon, MD    Objective     Vitals:   Temp:  [36.8 ??C (98.2 ??F)-36.9 ??C (98.4 ??F)] 36.8 ??C (98.2 ??F)  Heart Rate:  [58-66] 60  Resp:  [18] 18  BP: (144-177)/(68-74) 146/69  MAP (mmHg):  [90-102] 93  SpO2:  [93 %-96 %] 93 %    Intake/Output last 24 hours:  I/O last 3 completed shifts:  In: 240 [P.O.:240]  Out: 2500 [Urine:2500]    Physical Exam:    -General:  Appropriate, comfortable and in no apparent distress.   -Neurological: Awakens to voice. Moves all 4 extremities spontaneously.   -Psych: speech appropriate, pleasant affect  -Cardiovascular: Regular rate and rhythm  -Pulmonary: Normal work of breathing. No accessory muscle use  -Abdomen: Soft, non-distended. Appropriately tender to palpation post- operatively. Midline incision with dressing place.  No purulence.  -Genitourinary: Ileal conduit urostomy pink and moist with good UOP.  -Extremities: Warm, well perfused, normal skin turgor.     Data Review:    I have reviewed the labs and studies from the last 24 hours.    Imaging: Radiology studies were personally reviewed

## 2024-04-08 NOTE — Unmapped (Signed)
 Pt A/OX4, with intermittent forgetfulness. No PIV, provider aware. Pain managed with scheduled medications. [name] at bedside. Abdominal dressing changed this shift. Pt sacrum crusted throughout shift per order. Bed locked and low. Call light within reach. Purposeful rounding and turn schedule maintained.       Problem: Adult Inpatient Plan of Care  Goal: Plan of Care Review  Outcome: Progressing  Goal: Patient-Specific Goal (Individualized)  Outcome: Progressing  Goal: Absence of Hospital-Acquired Illness or Injury  Outcome: Progressing  Intervention: Identify and Manage Fall Risk  Recent Flowsheet Documentation  Taken 04/08/2024 0400 by Cherlynn Cornfield, RN  Safety Interventions:   fall reduction program maintained   family at bedside   nonskid shoes/slippers when out of bed   lighting adjusted for tasks/safety   low bed  Taken 04/08/2024 0200 by Cherlynn Cornfield, RN  Safety Interventions:   fall reduction program maintained   nonskid shoes/slippers when out of bed   lighting adjusted for tasks/safety   low bed   family at bedside  Taken 04/08/2024 0000 by Cherlynn Cornfield, RN  Safety Interventions:   fall reduction program maintained   nonskid shoes/slippers when out of bed   lighting adjusted for tasks/safety   low bed  Taken 04/07/2024 2200 by Cherlynn Cornfield, RN  Safety Interventions:   fall reduction program maintained   nonskid shoes/slippers when out of bed   lighting adjusted for tasks/safety   low bed  Taken 04/07/2024 2000 by Cherlynn Cornfield, RN  Safety Interventions:   fall reduction program maintained   nonskid shoes/slippers when out of bed   lighting adjusted for tasks/safety   low bed   family at bedside  Intervention: Prevent Skin Injury  Recent Flowsheet Documentation  Taken 04/08/2024 0400 by Cherlynn Cornfield, RN  Positioning for Skin: Right  Taken 04/08/2024 0200 by Cherlynn Cornfield, RN  Positioning for Skin: Left  Taken 04/08/2024 0000 by Cherlynn Cornfield, RN  Positioning for Skin: Right  Device Skin Pressure Protection: absorbent pad utilized/changed  Skin Protection: incontinence pads utilized  Taken 04/07/2024 2300 by Cherlynn Cornfield, RN  Device Skin Pressure Protection: absorbent pad utilized/changed  Skin Protection: incontinence pads utilized  Taken 04/07/2024 2200 by Cherlynn Cornfield, RN  Positioning for Skin: Left  Taken 04/07/2024 2000 by Cherlynn Cornfield, RN  Positioning for Skin: Supine/Back  Device Skin Pressure Protection: absorbent pad utilized/changed  Skin Protection: incontinence pads utilized  Intervention: Prevent and Manage VTE (Venous Thromboembolism) Risk  Recent Flowsheet Documentation  Taken 04/08/2024 0400 by Cherlynn Cornfield, RN  Anti-Embolism Device Type: SCD, Knee  Anti-Embolism Device Status: Refused  Anti-Embolism Device Location: BLE  Taken 04/08/2024 0200 by Cherlynn Cornfield, RN  Anti-Embolism Device Type: SCD, Knee  Anti-Embolism Device Status: Refused  Anti-Embolism Device Location: BLE  Taken 04/08/2024 0000 by Cherlynn Cornfield, RN  Anti-Embolism Device Type: SCD, Knee  Anti-Embolism Device Status: Refused  Anti-Embolism Device Location: BLE  Taken 04/07/2024 2300 by Cherlynn Cornfield, RN  Anti-Embolism Device Type: SCD, Knee  Anti-Embolism Device Status: Refused  Anti-Embolism Device Location: BLE  Taken 04/07/2024 2200 by Cherlynn Cornfield, RN  Anti-Embolism Device Type: SCD, Knee  Anti-Embolism Device Status: Refused  Anti-Embolism Device Location: BLE  Taken 04/07/2024 2000 by Cherlynn Cornfield, RN  Anti-Embolism Device Type: SCD, Knee  Anti-Embolism Device Status: Refused  Anti-Embolism Device Location: BLE  Intervention: Prevent Infection  Recent Flowsheet Documentation  Taken 04/08/2024 0400 by Cherlynn Cornfield, RN  Infection Prevention: hand hygiene promoted  Taken 04/08/2024 0200 by Cherlynn Cornfield, RN  Infection Prevention: hand  hygiene promoted  Taken 04/07/2024 2200 by Cherlynn Cornfield, RN  Infection Prevention: hand hygiene promoted  Taken 04/07/2024 2000 by Cherlynn Cornfield, RN  Infection Prevention: hand hygiene promoted  Goal: Optimal Comfort and Wellbeing  Outcome: Progressing  Goal: Readiness for Transition of Care  Outcome: Progressing  Goal: Rounds/Family Conference  Outcome: Progressing     Problem: Infection  Goal: Absence of Infection Signs and Symptoms  Outcome: Progressing  Intervention: Prevent or Manage Infection  Recent Flowsheet Documentation  Taken 04/08/2024 0400 by Cherlynn Cornfield, RN  Infection Management: aseptic technique maintained  Isolation Precautions: contact precautions maintained  Taken 04/08/2024 0200 by Cherlynn Cornfield, RN  Infection Management: aseptic technique maintained  Isolation Precautions: contact precautions maintained  Taken 04/07/2024 2200 by Cherlynn Cornfield, RN  Infection Management: aseptic technique maintained  Isolation Precautions: contact precautions maintained  Taken 04/07/2024 2000 by Cherlynn Cornfield, RN  Infection Management: aseptic technique maintained  Isolation Precautions: contact precautions maintained     Problem: Fall Injury Risk  Goal: Absence of Fall and Fall-Related Injury  Outcome: Progressing  Intervention: Identify and Manage Contributors  Recent Flowsheet Documentation  Taken 04/07/2024 2300 by Cherlynn Cornfield, RN  Self-Care Promotion: independence encouraged  Intervention: Promote Injury-Free Environment  Recent Flowsheet Documentation  Taken 04/08/2024 0400 by Cherlynn Cornfield, RN  Safety Interventions:   fall reduction program maintained   family at bedside   nonskid shoes/slippers when out of bed   lighting adjusted for tasks/safety   low bed  Taken 04/08/2024 0200 by Cherlynn Cornfield, RN  Safety Interventions:   fall reduction program maintained   nonskid shoes/slippers when out of bed   lighting adjusted for tasks/safety   low bed   family at bedside  Taken 04/08/2024 0000 by Cherlynn Cornfield, RN  Safety Interventions:   fall reduction program maintained   nonskid shoes/slippers when out of bed   lighting adjusted for tasks/safety   low bed  Taken 04/07/2024 2200 by Cherlynn Cornfield, RN  Safety Interventions:   fall reduction program maintained   nonskid shoes/slippers when out of bed   lighting adjusted for tasks/safety   low bed  Taken 04/07/2024 2000 by Cherlynn Cornfield, RN  Safety Interventions:   fall reduction program maintained   nonskid shoes/slippers when out of bed   lighting adjusted for tasks/safety   low bed   family at bedside     Problem: Wound  Goal: Optimal Coping  Outcome: Progressing  Goal: Optimal Functional Ability  Outcome: Progressing  Goal: Absence of Infection Signs and Symptoms  Outcome: Progressing  Intervention: Prevent or Manage Infection  Recent Flowsheet Documentation  Taken 04/08/2024 0400 by Cherlynn Cornfield, RN  Infection Management: aseptic technique maintained  Isolation Precautions: contact precautions maintained  Taken 04/08/2024 0200 by Cherlynn Cornfield, RN  Infection Management: aseptic technique maintained  Isolation Precautions: contact precautions maintained  Taken 04/07/2024 2200 by Cherlynn Cornfield, RN  Infection Management: aseptic technique maintained  Isolation Precautions: contact precautions maintained  Taken 04/07/2024 2000 by Cherlynn Cornfield, RN  Infection Management: aseptic technique maintained  Isolation Precautions: contact precautions maintained  Goal: Improved Oral Intake  Outcome: Progressing  Goal: Optimal Pain Control and Function  Outcome: Progressing  Goal: Skin Health and Integrity  Outcome: Progressing  Intervention: Optimize Skin Protection  Recent Flowsheet Documentation  Taken 04/08/2024 0400 by Cherlynn Cornfield, RN  Pressure Reduction Techniques: frequent weight shift encouraged  Pressure Reduction Devices: pressure-redistributing mattress utilized  Taken 04/08/2024 0200 by Cherlynn Cornfield, RN  Pressure Reduction Techniques: frequent weight shift  encouraged  Pressure Reduction Devices: pressure-redistributing mattress utilized  Taken 04/08/2024 0000 by Cherlynn Cornfield, RN  Pressure Reduction Techniques: frequent weight shift encouraged  Pressure Reduction Devices: pressure-redistributing mattress utilized  Skin Protection: incontinence pads utilized  Taken 04/07/2024 2300 by Cherlynn Cornfield, RN  Pressure Reduction Devices: pressure-redistributing mattress utilized  Skin Protection: incontinence pads utilized  Taken 04/07/2024 2200 by Cherlynn Cornfield, RN  Pressure Reduction Techniques: frequent weight shift encouraged  Pressure Reduction Devices: pressure-redistributing mattress utilized  Taken 04/07/2024 2000 by Cherlynn Cornfield, RN  Pressure Reduction Techniques: frequent weight shift encouraged  Pressure Reduction Devices: pressure-redistributing mattress utilized  Skin Protection: incontinence pads utilized  Goal: Optimal Wound Healing  Outcome: Progressing     Problem: Skin Injury Risk Increased  Goal: Skin Health and Integrity  Outcome: Progressing  Intervention: Optimize Skin Protection  Recent Flowsheet Documentation  Taken 04/08/2024 0400 by Cherlynn Cornfield, RN  Pressure Reduction Techniques: frequent weight shift encouraged  Pressure Reduction Devices: pressure-redistributing mattress utilized  Taken 04/08/2024 0200 by Cherlynn Cornfield, RN  Pressure Reduction Techniques: frequent weight shift encouraged  Pressure Reduction Devices: pressure-redistributing mattress utilized  Taken 04/08/2024 0000 by Cherlynn Cornfield, RN  Pressure Reduction Techniques: frequent weight shift encouraged  Pressure Reduction Devices: pressure-redistributing mattress utilized  Skin Protection: incontinence pads utilized  Taken 04/07/2024 2300 by Cherlynn Cornfield, RN  Pressure Reduction Devices: pressure-redistributing mattress utilized  Skin Protection: incontinence pads utilized  Taken 04/07/2024 2200 by Cherlynn Cornfield, RN  Pressure Reduction Techniques: frequent weight shift encouraged  Pressure Reduction Devices: pressure-redistributing mattress utilized  Taken 04/07/2024 2000 by Cherlynn Cornfield, RN  Pressure Reduction Techniques: frequent weight shift encouraged  Pressure Reduction Devices: pressure-redistributing mattress utilized  Skin Protection: incontinence pads utilized     Problem: Self-Care Deficit  Goal: Improved Ability to Complete Activities of Daily Living  Outcome: Progressing  Intervention: Promote Activity and Functional Independence  Recent Flowsheet Documentation  Taken 04/07/2024 2300 by Cherlynn Cornfield, RN  Self-Care Promotion: independence encouraged     Problem: Malnutrition  Goal: Improved Nutritional Intake  Outcome: Progressing     Problem: Breathing Pattern Ineffective  Goal: Effective Breathing Pattern  Outcome: Progressing  Intervention: Promote Improved Breathing Pattern  Recent Flowsheet Documentation  Taken 04/07/2024 2300 by Cherlynn Cornfield, RN  Airway/Ventilation Management: airway patency maintained

## 2024-04-08 NOTE — Unmapped (Signed)
 Patient/vitals stable and patient remain injury free. No pain medications given per shift, tylenol given as scheduled. RN and two nursing assistants sat patient on side of bed for dinner as requested and patient was unable to sit up unsupported. Patient requested to lie back in bed to eat dinner.Patient reports no needs at this time.

## 2024-04-09 LAB — BASIC METABOLIC PANEL
ANION GAP: 8 mmol/L (ref 5–14)
BLOOD UREA NITROGEN: 33 mg/dL — ABNORMAL HIGH (ref 9–23)
BUN / CREAT RATIO: 38
CALCIUM: 8.7 mg/dL (ref 8.7–10.4)
CHLORIDE: 106 mmol/L (ref 98–107)
CO2: 27 mmol/L (ref 20.0–31.0)
CREATININE: 0.87 mg/dL (ref 0.55–1.02)
EGFR CKD-EPI (2021) FEMALE: 67 mL/min/1.73m2 (ref >=60)
GLUCOSE RANDOM: 112 mg/dL (ref 70–179)
POTASSIUM: 5.4 mmol/L — ABNORMAL HIGH (ref 3.4–4.8)
SODIUM: 141 mmol/L (ref 135–145)

## 2024-04-09 LAB — CBC
HEMATOCRIT: 28.1 % — ABNORMAL LOW (ref 34.0–44.0)
HEMOGLOBIN: 9 g/dL — ABNORMAL LOW (ref 11.3–14.9)
MEAN CORPUSCULAR HEMOGLOBIN CONC: 32 g/dL (ref 32.0–36.0)
MEAN CORPUSCULAR HEMOGLOBIN: 28.3 pg (ref 25.9–32.4)
MEAN CORPUSCULAR VOLUME: 88.6 fL (ref 77.6–95.7)
MEAN PLATELET VOLUME: 8.3 fL (ref 6.8–10.7)
PLATELET COUNT: 291 10*9/L (ref 150–450)
RED BLOOD CELL COUNT: 3.18 10*12/L — ABNORMAL LOW (ref 3.95–5.13)
RED CELL DISTRIBUTION WIDTH: 20.2 % — ABNORMAL HIGH (ref 12.2–15.2)
WBC ADJUSTED: 6.9 10*9/L (ref 3.6–11.2)

## 2024-04-09 LAB — PHOSPHORUS: PHOSPHORUS: 2.4 mg/dL (ref 2.4–5.1)

## 2024-04-09 LAB — MAGNESIUM: MAGNESIUM: 1.9 mg/dL (ref 1.6–2.6)

## 2024-04-09 MED ADMIN — insulin NPH (HumuLIN,NovoLIN) injection 10 Units: 10 [IU] | SUBCUTANEOUS | @ 22:00:00

## 2024-04-09 MED ADMIN — enoxaparin (LOVENOX) syringe 50 mg: 50 mg | SUBCUTANEOUS | @ 13:00:00

## 2024-04-09 MED ADMIN — ipratropium (ATROVENT) 0.02 % nebulizer solution 500 mcg: 500 ug | RESPIRATORY_TRACT | @ 02:00:00

## 2024-04-09 MED ADMIN — acetaminophen (TYLENOL) tablet 500 mg: 500 mg | ORAL | @ 22:00:00

## 2024-04-09 MED ADMIN — atorvastatin (LIPITOR) tablet 40 mg: 40 mg | ORAL | @ 13:00:00

## 2024-04-09 MED ADMIN — ipratropium (ATROVENT) 0.02 % nebulizer solution 500 mcg: 500 ug | RESPIRATORY_TRACT | @ 10:00:00

## 2024-04-09 MED ADMIN — insulin lispro (HumaLOG) injection 0-20 Units: 0-20 [IU] | SUBCUTANEOUS | @ 02:00:00

## 2024-04-09 MED ADMIN — carboxymethylcellulose sodium (THERATEARS) 0.25 % ophthalmic solution 2 drop: 2 [drp] | OPHTHALMIC | @ 13:00:00

## 2024-04-09 MED ADMIN — arformoterol (BROVANA) nebulizer solution 15 mcg/2 mL: 15 ug | RESPIRATORY_TRACT

## 2024-04-09 MED ADMIN — enoxaparin (LOVENOX) syringe 50 mg: 50 mg | SUBCUTANEOUS

## 2024-04-09 MED ADMIN — acetaminophen (TYLENOL) tablet 500 mg: 500 mg | ORAL | @ 15:00:00

## 2024-04-09 MED ADMIN — ipratropium (ATROVENT) 0.02 % nebulizer solution 500 mcg: 500 ug | RESPIRATORY_TRACT | @ 15:00:00

## 2024-04-09 MED ADMIN — polyethylene glycol (MIRALAX) packet 17 g: 17 g | ORAL | @ 13:00:00

## 2024-04-09 MED ADMIN — ipratropium (ATROVENT) 0.02 % nebulizer solution 500 mcg: 500 ug | RESPIRATORY_TRACT | @ 22:00:00

## 2024-04-09 MED ADMIN — methocarbamol (ROBAXIN) tablet 500 mg: 500 mg | ORAL

## 2024-04-09 MED ADMIN — acetaminophen (TYLENOL) tablet 500 mg: 500 mg | ORAL | @ 10:00:00

## 2024-04-09 MED ADMIN — arformoterol (BROVANA) nebulizer solution 15 mcg/2 mL: 15 ug | RESPIRATORY_TRACT | @ 13:00:00

## 2024-04-09 NOTE — Unmapped (Signed)
 Pt A/O x4; forgetful at times, easily reoriented. Regular diet and tolerating well. Denies need for PRN medications.     AFib noted upon routine vitals check; pt asymptomatic. MD notified and assessed pt at bedside. See orders. Tele placed, per order. VSS otherwise. Pt O2 well above parameters with 1L of O2 [place]. Pt and family educated on lack of necessity of 1L O2 [place] (as evidenced by sat of 96% on RA), though family refused removal of [place] until around 0600. Pt and family member educated on medications, necessity, and rights throughout shift.     Pt had multiple BMs; loose and watery. Crusting performed per order.     Urostomy in place and assessed per protocol.     All belongings with patient. Bed is low and locked. Purposeful hourly monitoring maintained.

## 2024-04-09 NOTE — Unmapped (Signed)
 [facility], ACUTE CARE, and GENERAL SURGERY   - Surgery Daily Progress Note -  04/09/2024     Admit Date: 02/26/2024, Hospital Day: 73  Hospital Service: [facility])  Attending: [name], MD  [facility] - [facility]  General / Trauma Surgery - Trauma/CC     Assessment     Tammy Braun 83 y.o. female, PMH bladder cancer with cystectomy and ileal conduit, COPD, recent dx of afib, moderate-severe TR, CAD (high calcification score), T2DM presented to [facility] with projectile vomiting and abdominal pain, found to have SBO with transition point at level of ileostomy on CT A/P. OR on 02/28/24 for ex-lap, reduction of parastomal hernia, small bowel resection with primary anastomosis and lysis of adhesions.     Interval Events:  -02/26/24: Admitted to [facility] floor; urology consult for possible ileal conduit revision.   -02/27/24: Patient level of care escalated to step-down d/t increased oxygen needs   -02/28/24: Upgraded to ICU. Intubated at bed-side. OR for ex-lap, reduction of parastomal hernia, small bowel resection with primary anastomosis, lysis of adhesions, abdomen left open with ABThera in place.   -03/01/24: OR for attempt at abdominal closure. Left open due to desaturations during closure attempt. ABThera in place.  -03/03/24: RTOR for abdominal closure  -03/06/24: Vas cath placed, CRRT started   -03/09/24: Extubated to HFNC  -03/10/24: CRRT discontinued   -03/11/24: Re-intubated for hypoxic respiratory failure 2/2 mucus plug   -03/14/24 Extubated to HFNC   -03/15/24: Vas cath removed   -03/16/24: Placed on BiPap for complete collapse of L lung 2/2 probable mucus plugging  -03/19/24: Re-intubated for mucus plugging. Therapeutic bronch, BAL sent.   -03/22/24: Extubated to HFNC  -03/27/24: Transferred to stepdown  -04/03/24: Transferred to floor    Subjective   NAEO. Family continues to place patient     Plan     Neuro:   - PRN APAP, discontinue lyrica     *Insomnia  - Melatonin PRN    *Schizoaffective disorder  - Continue Prolixin  monthly (last dose 3/14)     CV:   *Hx HTN   - HDS  - Holding home amlodipine  given low/normal pressures     *HFpEF, pulmonary HTN   - Echo (3/12) EF 70%, severe pulmonary HTN     *Hx of HLD  - Continue lipitor  40mg  daily    *Afib-flutter w/ RVR  *Hx of paroxysmal Afib  - Was taking diltiazem  outpatient; previously held due to likely poor absorption and started on amiodarone  in ICU.  - Cardiology consulted, appreciate assistance:    > Can discontinue amiodarone      > Resume DOAC as able    > Page on DC for follow-up     Resp:   *Acute on chronic hypoxemia, hx COPD  *Pulmonary edema  *Pulmonary HTN   - Continue pulmonary toilet: OOB and IS   - Continue atrovent  q6h, brovana  BID     *Pulmonary embolism, segmental  - CTA chest (3/4) filling defect in L anterior segmental artery    - Heparin  gtt transitioned to therapeutic lovenox  3/30. Started on Eliquis .    FEN/GI:   - Fluids: ML   - Replete lytes PRN  - N: Regular diet with food cut up in small pieces. Cal count in place. Supplements ordered.   - Severe Protein-Calorie Malnutrition in the context of acute illness or injury (03/10/24 0901)  Fat Loss: Moderate  Muscle Loss: Severe  Fluid Accumulation: Moderate  Malnutrition Score: 3  - PRN Zofran     *  S/p ex-lap, reduction of parastomal hernia, small bowel resection with primary anastomosis, lysis of adhesions   - WTD dressing changes daily per nursing to midline incision    *Large hiatal hernia, chronic  - re-eval on CT chest/abd/pelvis 3/13 stable    GU/Renal:   - Adequate UOP.   - Cr WNL  - Ileal conduit viable with adequate UOP    *AKI on CKD (resolved)  - Cr stable now  - Diuresis as needed    *H/o bladder cancer s/p radical cystectomy with ileal conduit (2012)  *Parastomal hernia  - Strict I&Os  - Adequate UOP from stoma    Heme  - Hgb stable    ID:   - Afebrile.  - WBC- No leukocytosis.  - C-Diff negative 3/26    Endocrine:   - Continue NPH 10 units BID  - Regular SSI q6hr   - POCT increased to q4h    PPx: Therapeutic lovenox     Dispo: Floor status  PT: 5XL  OT: 5XL  ST: 5X  Barriers to discharge:  None  CM/SW assisting in discharge planning.   - Medically ready for dispo, CM assisting with SNF placement vs. home    Contact: [facility] Pg. [phone]  Helene Loader, MD    Objective     Vitals:   Temp:  [36.7 ??C (98.1 ??F)-36.9 ??C (98.4 ??F)] 36.7 ??C (98.1 ??F)  Heart Rate:  [54-110] 62  Resp:  [18] 18  BP: (111-160)/(58-99) 123/58  MAP (mmHg):  [77-114] 77  SpO2:  [93 %-98 %] 94 %    Intake/Output last 24 hours:  I/O last 3 completed shifts:  In: 120 [P.O.:120]  Out: 2400 [Urine:2400]    Physical Exam:    -General:  Appropriate, comfortable and in no apparent distress.   -Neurological: Awakens to voice. Moves all 4 extremities spontaneously.   -Psych: speech appropriate, pleasant affect  -Cardiovascular: Regular rate and rhythm  -Pulmonary: Normal work of breathing. No accessory muscle use  -Abdomen: Soft, non-distended. Appropriately tender to palpation post- operatively. Midline incision with dressing place.  No purulence.  -Genitourinary: Ileal conduit urostomy pink and moist with good UOP.  -Extremities: Warm, well perfused, normal skin turgor.     Data Review:    I have reviewed the labs and studies from the last 24 hours.    Imaging: Radiology studies were personally reviewed

## 2024-04-09 NOTE — Unmapped (Signed)
 Patient stable, no acute events this shift.

## 2024-04-10 MED ORDER — ACETAMINOPHEN 500 MG TABLET
ORAL_TABLET | Freq: Four times a day (QID) | ORAL | 0 refills | 8.00 days | Status: CP
Start: 2024-04-10 — End: ?

## 2024-04-10 MED ORDER — SENNOSIDES 8.6 MG TABLET
ORAL_TABLET | Freq: Every evening | ORAL | 0 refills | 30.00 days | Status: CP
Start: 2024-04-10 — End: 2024-05-10

## 2024-04-10 MED ORDER — POLYETHYLENE GLYCOL 3350 17 GRAM ORAL POWDER PACKET
PACK | Freq: Two times a day (BID) | ORAL | 0 refills | 30.00 days | Status: CP
Start: 2024-04-10 — End: 2024-05-10

## 2024-04-10 MED ORDER — OXYCODONE 5 MG TABLET
ORAL_TABLET | ORAL | 0 refills | 2.00 days | Status: CP | PRN
Start: 2024-04-10 — End: 2024-04-15

## 2024-04-10 MED ORDER — APIXABAN 5 MG TABLET
ORAL_TABLET | Freq: Two times a day (BID) | ORAL | 2 refills | 30.00 days | Status: CP
Start: 2024-04-10 — End: 2024-07-09

## 2024-04-10 MED ADMIN — arformoterol (BROVANA) nebulizer solution 15 mcg/2 mL: 15 ug | RESPIRATORY_TRACT | @ 12:00:00

## 2024-04-10 MED ADMIN — atorvastatin (LIPITOR) tablet 40 mg: 40 mg | ORAL | @ 13:00:00

## 2024-04-10 MED ADMIN — acetaminophen (TYLENOL) tablet 500 mg: 500 mg | ORAL | @ 21:00:00

## 2024-04-10 MED ADMIN — oxyCODONE (ROXICODONE) immediate release tablet 5 mg: 5 mg | ORAL | @ 12:00:00 | Stop: 2024-04-19

## 2024-04-10 MED ADMIN — ipratropium (ATROVENT) 0.02 % nebulizer solution 500 mcg: 500 ug | RESPIRATORY_TRACT | @ 14:00:00

## 2024-04-10 MED ADMIN — ipratropium (ATROVENT) 0.02 % nebulizer solution 500 mcg: 500 ug | RESPIRATORY_TRACT | @ 02:00:00

## 2024-04-10 MED ADMIN — cholecalciferol (vitamin D3 25 mcg (1,000 units)) tablet 50 mcg: 50 ug | ORAL | @ 17:00:00

## 2024-04-10 MED ADMIN — arformoterol (BROVANA) nebulizer solution 15 mcg/2 mL: 15 ug | RESPIRATORY_TRACT

## 2024-04-10 MED ADMIN — insulin lispro (HumaLOG) injection 0-20 Units: 0-20 [IU] | SUBCUTANEOUS | @ 22:00:00

## 2024-04-10 MED ADMIN — acetaminophen (TYLENOL) tablet 500 mg: 500 mg | ORAL | @ 17:00:00

## 2024-04-10 MED ADMIN — methocarbamol (ROBAXIN) tablet 500 mg: 500 mg | ORAL

## 2024-04-10 MED ADMIN — acetaminophen (TYLENOL) tablet 500 mg: 500 mg | ORAL | @ 10:00:00

## 2024-04-10 MED ADMIN — enoxaparin (LOVENOX) syringe 50 mg: 50 mg | SUBCUTANEOUS

## 2024-04-10 MED ADMIN — carboxymethylcellulose sodium (THERATEARS) 0.25 % ophthalmic solution 2 drop: 2 [drp] | OPHTHALMIC | @ 13:00:00

## 2024-04-10 MED ADMIN — ipratropium (ATROVENT) 0.02 % nebulizer solution 500 mcg: 500 ug | RESPIRATORY_TRACT | @ 21:00:00

## 2024-04-10 MED ADMIN — polyethylene glycol (MIRALAX) packet 17 g: 17 g | ORAL | @ 13:00:00

## 2024-04-10 MED ADMIN — acetaminophen (TYLENOL) tablet 500 mg: 500 mg | ORAL | @ 04:00:00

## 2024-04-10 MED ADMIN — enoxaparin (LOVENOX) syringe 50 mg: 50 mg | SUBCUTANEOUS | @ 13:00:00

## 2024-04-10 MED ADMIN — insulin NPH (HumuLIN,NovoLIN) injection 10 Units: 10 [IU] | SUBCUTANEOUS | @ 22:00:00

## 2024-04-10 MED ADMIN — carboxymethylcellulose sodium (THERATEARS) 0.25 % ophthalmic solution 2 drop: 2 [drp] | OPHTHALMIC | @ 17:00:00

## 2024-04-10 NOTE — Unmapped (Signed)
 Problem: Adult Inpatient Plan of Care  Goal: Plan of Care Review  Outcome: Progressing  Goal: Patient-Specific Goal (Individualized)  Outcome: Progressing  Goal: Absence of Hospital-Acquired Illness or Injury  Outcome: Progressing  Goal: Optimal Comfort and Wellbeing  Outcome: Progressing  Goal: Readiness for Transition of Care  Outcome: Progressing  Goal: Rounds/Family Conference  Outcome: Progressing     Problem: Infection  Goal: Absence of Infection Signs and Symptoms  Outcome: Progressing     Problem: Fall Injury Risk  Goal: Absence of Fall and Fall-Related Injury  Outcome: Progressing     Problem: Wound  Goal: Optimal Coping  Outcome: Progressing  Goal: Optimal Functional Ability  Outcome: Progressing  Goal: Absence of Infection Signs and Symptoms  Outcome: Progressing  Goal: Improved Oral Intake  Outcome: Progressing  Goal: Optimal Pain Control and Function  Outcome: Progressing  Goal: Skin Health and Integrity  Outcome: Progressing  Goal: Optimal Wound Healing  Outcome: Progressing     Problem: Skin Injury Risk Increased  Goal: Skin Health and Integrity  Outcome: Progressing     Problem: Self-Care Deficit  Goal: Improved Ability to Complete Activities of Daily Living  Outcome: Progressing     Problem: Malnutrition  Goal: Improved Nutritional Intake  Outcome: Progressing     Problem: Breathing Pattern Ineffective  Goal: Effective Breathing Pattern  Outcome: Progressing

## 2024-04-10 NOTE — Unmapped (Signed)
 Pt A/O x4 VSS. Regular diet and tolerating well. Denies need for PRN medications.     Urostomy in place and assessed per protocol.     All belongings with patient. Bed is low and locked. Purposeful hourly monitoring maintained.

## 2024-04-10 NOTE — Unmapped (Signed)
-   Patient lift/ hoyer lift  Patient needs patient/hoyer lift in order to safely transfer between bed and a chair, wheelchair, or commode. Hoyer lift is required and without the use of a lift, the patient would be bed confined. Patient needs hoyer lift to transfer safely from bed/chair. The patient would be bed bound without the use of the hoyer lift.      - Semi-Hospital bed  The patient requires frequent or immediate changes to body position and patient requires the head of the bed to be elevated more than 30 degrees most of the time due to COPD and abdominal surgery sites. Pillows or wedges have been considered and ruled out and patient's medical condition requires positioning of the body in ways not feasible with an ordinary bed. Pt will need this bed for the rest of their life.     - Wheelchair  Patient needs wheelchair as patient has a mobility limitation that significantly impairs their ability to participate all mobility-related activities of daily living (MRADLs) such as toileting, feeding, dressing, grooming, and bathing in customary locations in the home. Patient's limitations cannot be sufficiently resolved nor corrected by the use of an appropriately fitted cane or walker. Patient has a caregiver that is available, willing, and able to provide assistance with the wheelchair. The patient has adequate space in their living environment for a wheelchair.       - Bedside Commode  Patient is confined to a single room and/or level of the home w/ no access to a bathroom on that level and will need a beside commode.      - Shower Chair  Patient is unable to safely and/or independently perform bathing without the use of an appropriate shower chair or caregiver is unable to safely or timely perform bathing without the use of an appropriate shower chair.     Mathias Solon, MD

## 2024-04-10 NOTE — Unmapped (Deleted)
 Discharge Summary    Admit date: 02/26/2024    Discharge date and time: 04/10/24 at 2:58 PM    Discharge to:  Home    Discharge Service: Lamar Pillar [facility])    Discharge Attending Physician: [name], MD    Discharge  Diagnoses: SBO  Interval Events:  -02/26/24: Admitted to [facility] floor; urology consult for possible ileal conduit revision.   -02/27/24: Patient level of care escalated to step-down d/t increased oxygen needs   -02/28/24: Upgraded to ICU. Intubated at bed-side. OR for ex-lap, reduction of parastomal hernia, small bowel resection with primary anastomosis, lysis of adhesions, abdomen left open with ABThera in place.   -03/01/24: OR for attempt at abdominal closure. Left open due to desaturations during closure attempt. ABThera in place.  -03/03/24: RTOR for abdominal closure  -03/06/24: Vas cath placed, CRRT started   -03/09/24: Extubated to HFNC  -03/10/24: CRRT discontinued   -03/11/24: Re-intubated for hypoxic respiratory failure 2/2 mucus plug   -03/14/24 Extubated to HFNC   -03/15/24: Vas cath removed   -03/16/24: Placed on BiPap for complete collapse of L lung 2/2 probable mucus plugging  -03/19/24: Re-intubated for mucus plugging. Therapeutic bronch, BAL sent.   -03/22/24: Extubated to HFNC  -03/27/24: Transferred to stepdown  -04/03/24: Transferred to floor    Secondary Diagnosis: Principal Problem:    SBO (small bowel obstruction) (POA: Unknown)  Active Problems:    Tobacco use disorder (POA: Yes)  Resolved Problems:    * No resolved hospital problems. *      OR Procedures:    EXPLORATORY LAPAROTOMY, EXPLORATORY CELIOTOMY WITH OR WITHOUT BIOPSY(S)  Date  02/28/2024  -------------------  **Canceled**    REOPENING OF RECENT LAPAROTOMY  Date  02/29/2024  -------------------  **Canceled**    Midline - REOPENING OF RECENT LAPAROTOMY  Date  03/01/2024  -------------------    Midline - REOPENING OF RECENT LAPAROTOMY  Date  03/01/2024  -------------------    REOPENING OF RECENT LAPAROTOMY  Date  03/03/2024  -------------------     Ancillary Procedures: no procedures    Discharge Day Services: The patient was seen and examined by the Trauma Surgery team on the day of discharge. Vital signs and laboratory values were stable. Discharge plan was discussed, instructions were given and all questions answered.  No follow up with SRH needed.  Follow up with cardiology requested and pt to follow up with home pcp and urologist.    Subjective   No acute events overnight. Pain Controlled. No fever or chills.    Objective   Patient Vitals for the past 8 hrs:   BP Temp Temp src Pulse Resp SpO2   04/10/24 1300 143/69 36.9 ??C (98.4 ??F) Oral 65 18 91 %   04/10/24 0702 -- -- -- 56 -- --     I/O this shift:  In: 100 [P.O.:100]  Out: -     Physical Exam:     -General:  Appropriate, comfortable and in no apparent distress.   -Neurological: Awakens to voice. Moves all 4 extremities spontaneously.   -Psych: speech appropriate, pleasant affect  -Cardiovascular: Regular rate and rhythm  -Pulmonary: Normal work of breathing. No accessory muscle use  -Abdomen: Soft, non-distended. Appropriately tender to palpation post- operatively. Midline incision c/d/i.  No purulence.  -Genitourinary: Ileal conduit urostomy pink and moist with good UOP.  -Extremities: Warm, well perfused, normal skin turgor.     Hospital Course:  Tammy Braun 83 y.o. female with PMH of bladder cancer for which she has  an ileal conduit (2012), COPD (no O2 requirement at baseline), HTN, HLD, DM, CKD, prior CVA. Presented to the ER with c/o of projectile vomiting and abdominal pain. CT A/P showing SBO with transition point at ileal conduit. Labs with leukocytosis iso hemoconcentration, AKI on CKD, mildly elevated venous lactate improved with resuscitation. Transferred to [facility] 2/28 for worsening hypoxic respiratory failure requiring intubation. Long and complicated stay in the [facility] in the post operative period including: failed extubation x2, klebsiella pna resistant to zosyn , acute respiratory failure, recurrent SBO, feeding intolerance, and aspiration.      - 02/26/24: Admitted to [facility], floor status, consult to urology for possible ileal conduit revision.   - 02/27/24: Patient level of care escalated to step-down iso increased oxygen needs while in ED.  - 02/28/24: Upgraded to ICU. Intubated at bed-side. OR for ex-lap, reduction of parastomal hernia, small bowel resection with primary anastomosis, lysis of adhesions, abdomen left open with ABThera in place.   - 03/01/24: OR for attempt at abdominal closure. Left open due to desaturations during closure attempt. ABThera in place.  - 03/03/24: RTOR for abdominal closure  -03/06/24: Vas cath placed, CRRT started   -03/09/24: Extubated to HFNC  -03/10/24: CRRT discontinued   - 03/11/24: Re-intubated for hypoxic respiratory failure 2/2 mucus plug   - 03/14/24 Extubated to HFNC   -03/15/24: Vas cath removed   -03/16/24: Placed on BiPap 2/2 aspiration and increased supplemental oxygen support/mucus plugging  -03/19/24: Re-intubated for mucus plugging. Therapeutic bronch, BAL sent.   -03/22/24: Extubated to HFNC    #Small bowel obstruction -Parastomal hernia: Imaging revealed evidence of bowel obstruction and a nasogastric tube was placed. The patient was taken to the OR 2/28 for ex-lap, reduction of parastomal hernia, small bowel resection with primary anastomosis, lysis of adhesions, abdomen left open with ABThera in place.   She was maintained on bowel rest with NG tube decompression and supportive care with anti-emetics, IV fluid resuscitation and electrolyte repletion.  Taken to the OR on 02/28/24 for ex-lap, reduction of parastomal hernia, small bowel resection with lysis of adhesions. Abthera placed and left open. Returned to OR 03/01/24 for attempt closure, unable to close due to desaturations while attempting to close. Returned to OR 03/03/24 for abdominal closure.      TF started via NG tube, intially the patient did not tolerate. CT A/P obtained 03/06/24 which demonstrated mild dilation of multiple fluid-filled loops of small bowel, concerning for recurrent SBO with transition point. Patient made NPO, bowel rest with NG tube decompression and supportive care resumed. Once having bowel movements TF were restarted but not able to maintain at goal rate iso emesis episodes which led to aspiration PNA and multi-pressor shock. In interm nutrition was supported with TPN.   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 This hernia has been present for many years dating back and largely unchanged from CT abdomen/pelvis in 2012 and markedly unchanged on CT from 3/14 from the CT obtained on initial presentation.   TF bridged from TPN. Currently able to tolerate TF. SLP consulted. Patient continues to be a high aspiration risk. MBSS completed and recommended patient trial regular diet. Corpak was removed on 4/2 after patient was tolerating enough oral intake    #Acute hypoxic respiratory failure: Transferred to ICU 02/28/24 for increasing oxygen needs and was intubated. Failed extubation x2. Last extubation 3/23 and weaned to Nasal Canula.  Daily chest xray. Diuresis was given on an as needed basis until patient was weaned to RA.  Pt intermittently placed back on nasal cannula per family preference, but prior to placing on oxygen O2 sats remained stable, not indicating O2 supplementation.     #Urinary tract infection: UA dirty, urine culture showed Mixed GPO/GNO. Ceftriaxone started on 2/26 for 5 days (2/26-3/3). Per SRU, patient is colonizied so will probably have a + urine culture.  Treated for UTI given foul smelling urine. Re culture from 3/13 NGTD.    #AKI: AKI from ATN 2/2 to volume depletion from vomiting + hypotension + CIN. Baseline Cr around 1.05.  Urine lytes sent on 2/27. Showed FeNA score of 1.9%. Creatinine trended up and 3/7 required CRRT. AKI resolved as evidenced by down trending creatinine and by discharge was WNL.      #Mild bilateral hydroureteronephrosis: SRU consulted on 2/27, recommended no acute surgical intervention, re-consider re-imaging based on trend.   Urology re-engaged when decision to take to OR.  Intra-op urology interrogated stoma with no signs of leak. Catheter placed in stoma with return of UOP. Creatinine at baseline, foley out.      #Afib-flutter w/ RVR / Hx of paroxysmal Afib, CHADSVASC score of 6:  - Was taking diltiazem outpatient; previously held due to likely poor absorption in the setting of SBO.  Amiodarone started in ICU. Cardiology formally consulted in stepdown, and recommend continuining PO amiodarone 150-200 mg daily but not needed at discharge. Amiodarone dose decreased down to 100 mg on 3/30 per daughter request. Patient HR elevated 4/1 and amiodarone was increased to 150mg  per daughter request but OK per cardiology. By discharge was off amiodarone.  Transitioned from heparin gtt to lovenox on 3/30 and then Eliquis on 04/10/24.     # PE:   - Filling defect in L anterior segmental artery on 3/5 CTA chest. Heparin gtt converted to therapeutic lovenox and then Eliquis as above.     #Constipation: Patient unable to have BM and increased stool burden visualized on CXR. Received aggressive BR while admitted and was having regular BM by discharge.      She is voiding adequately, debilitated requiring PT/ OT, and her pain was controlled wth PO pain medications. She was examined by the Trauma Surgery team on the day of discharge and was deemed suitable for discharge home. She will be discharged on POD #43 in good condition.    I spent greater than 30 minutes completing this discharge.     Condition at Discharge: Improved  Discharge Medications:      Medication List      PAUSE taking these medications     amlodipine 10 MG tablet; Wait to take this until your doctor or other   care provider tells you to start again.; Commonly known as: NORVASC   furosemide 20 MG tablet; Wait to take this until your doctor or other   care provider tells you to start again.; Commonly known as: LASIX; TAKE 2   TABLETS BY MOUTH DAILY FOR 5 DAYS, THEN 1 TABLET DAILY     START taking these medications     acetaminophen 500 MG tablet; Commonly known as: TYLENOL; Take 1 tablet   (500 mg total) by mouth every six (6) hours.   apixaban 5 mg Tab; Commonly known as: ELIQUIS; Take 1 tablet (5 mg   total) by mouth two (2) times a day.   oxyCODONE 5 MG immediate release tablet; Commonly known as: ROXICODONE;  Take 1 tablet (5 mg total) by mouth every four (4) hours as needed for up   to 5 days.   polyethylene glycol 17 gram packet; Commonly known as: MIRALAX ; Take 17   g by mouth two (2) times a day.   senna 8.6 mg tablet; Commonly known as: SENOKOT; Take 1 tablet by mouth   nightly.     CONTINUE taking these medications     albuterol  90 mcg/actuation inhaler; Commonly known as: PROVENTIL    HFA;VENTOLIN  HFA; Inhale 2 puffs every four (4) hours as needed for   wheezing.   ASPERCREME TOP   atorvastatin  40 MG tablet; Commonly known as: LIPITOR ; Take 1 tablet (40   mg total) by mouth daily.   blood-glucose meter kit; Disp. blood glucose meter kit preferred by   patient's insurance. Check blood sugars as directed by provider. Dx:   Diabetes, E11.9   cyclobenzaprine  5 MG tablet; Commonly known as: FLEXERIL ; Take 1 tablet   (5 mg total) by mouth two (2) times a day as needed for muscle spasms.   fluPHENAZine  decanoate 25 mg/mL injection; Commonly known as: PROLIXIN ;   Inject 2 mL (50 mg total) into the muscle every thirty (30) days.   gabapentin  100 MG capsule; Commonly known as: NEURONTIN ; Take 1 capsule   (100 mg total) by mouth Three (3) times a day.   glucose blood test strip; Generic drug: blood sugar diagnostic; Disp   test strips preferred by insurance plan. 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   lancets Misc; Disp. lancets #30 or amount allowed, Testing once Qday.   Dx: E11.9 (Diabetes- controlled)   * MEDICAL SUPPLY ITEM; Entreal formula nutritionally supplement complete   caloric density   * MEDICAL SUPPLY ITEM; Compression stockings knee high  * This list has 2 medication(s) that are the same as other medications   prescribed for you. Read the directions carefully, and ask your doctor or   other care provider to review them with you.     STOP taking these medications     candesartan  16 MG tablet; Commonly known as: ATACAND    dilTIAZem  120 MG 24 hr capsule; Commonly known as: CARDIZEM  CD   traMADol  50 mg tablet; Commonly known as: ULTRAM        Pending Test Results:     Discharge Instructions:  Activity:   Activity Instructions       Activity as tolerated      Lifting restrictions (specify)      Weight restriction of 20 lbs.    OK to shower (no bath)            Diet:  Diet Instructions       Discharge diet (specify)      Discharge Nutrition Therapy: Regular          Other Instructions:  Other Instructions       Call MD for:  persistent nausea or vomiting      Call MD for:  redness, tenderness, or signs of infection (pain, swelling, redness, odor or green/yellow discharge around incision site)      Call MD for:  severe uncontrolled pain      Call MD for: Temperature > 38.5 Celsius ( > 101.3 Fahrenheit)      Discharge instructions      Instructions    DIET: You  may eat the same night of your surgery.  We encourage you to eat light meals for the first 24 hours because the anesthesia may make you nauseated.  You may advance your diet to regular, as tolerated. You may experience bloating or have loose, watery stool for several days after surgery.    ACTIVITY: You may shower in 48 hours (2 days) after the procedure, but please avoid baths, hot tubs, pools, soaking or sitting in water  for 2 weeks.  Please allow the soap and water  to run over your incision.  Do not scrub your incision.       No lifting greater than 10 pounds or strenuous activity for 2 weeks then nothing over 20 pounds from 2-6 weeks. If feeling pulling at incision back off.  Continue restrictions until 04/14/24.    INCISION: Look at your incisions at least twice a day. A small amount of drainage is normal, especially in the first couple of days after surgery. If you notice any redness around your incisions that spreads along your skin, or any thick, yellow drainage, you should call the clinic. The surgical glue that was placed over your incisions will dissolve naturally over time. Do not put any ointments or cream over the glue, and do not pull the glue off or pick at it. Avoid wearing tight clothing.    PAIN MEDICATION: Do not drive or drink alcohol while taking prescription pain medication. Take your prescribed pain medication only as needed. You may decrease the amount of pain medication as tolerated, and take Tylenol  or a Motrin  product as directed on the label if have tolerated these medications before and have not been told by another provider not to take them.  Pain medication can cause constipation. To avoid this, you should take an over-the-counter stool softener, such as docusate 100mg  1-2 times daily.     Effective June 28, 2016, the Samie [place]s signed a [place]  law that works to address the opioid crisis our state is facing.  It is called the STOP Law, for Strengthen Opioid Misuse Prevention.       As part of this law, we are limited to prescribing no more than a seven-day supply of opioid pain medicine for our surgery patients initally.  (This law also limits prescriptions for patients who have not had surgery to a five-day supply.)     If you are in need of additional pain control, the best number to call is [phone] before your current medications run out.  For after hours and weekends, please call [phone] and ask to speak with the surgery resident on call. The on-call provider may NOT be able to renew your prescription.     The STOP Act also monitors the amount of controlled substances that each patient has received from any and all providers.  Prescriptions for each patient will be monitored by the [place] Controlled Substances Reporting System in accordance with the law.  Thank you for working with us  so we can provide good pain control in the safest way possible while you recover.     Please note that prescription pain medication refills will not be called into your pharmacy. If you feel that you are needing more pain medication, you will need to be seen in clinic or the Emergency Department for further evaluation to ensure you are not experiencing a complication. Please take note of how many pills you have left in your bottle, and if you are starting to run low and feel that you  will need more for adequate pain control, to call the clinic as soon as possible to schedule an appointment, as our clinics are very busy, and we cannot guarantee same week appointments.    The Trauma Surgery office number is [phone]. For emergencies at night or on the week-end, please call [phone]) and ask for the surgery resident on call.    WHEN TO CALL THE SURGEON'S OFFICE:  1. Thick, yellow drainage from your incisions, redness at incisions, bleeding, or separations of wounds.  2. Temperature greater than 101.5  3. Uncontrolled nausea or vomiting.  4. Pain that is not controlled by your pain medication     Standard Patient Notification Script  We care deeply about how our patients' pain is managed after surgery.  During the month after you have surgery, you may receive a call from us  asking about your postsurgical pain and about how you managed that pain.  The survey will take approximately 5 to 10 minutes and will help us  to provide better care to our patients.  Thank you in advance for your participation and please answer any calls coming from the [facility] or [place] area!    Follow up with Primary Care Physician to inform him or her of the admission and to discuss the following incidental findings noted on CT scan:  Large right complex hiatal hernia  Dilated main pulmonary artery measuring 3.7 cm   Extensive coronary artery calcifications.   Mass effect on the heart from the large mixed hiatal/paraesophageal hernia.   Scattered calcified granulomata.   Common bile duct dilation measuring 1.2 cm.   Mild intrahepatic biliary ductal dilatation, unchanged compared to prior.   Diffuse fatty atrophy pancreas  Thickening of the bilateral adrenal glands.   Multiple unchanged renal cysts which were previously characterized on MRI 01/24/2024   Colonic diverticulosis  Extensive atherosclerotic disease   Multilevel degenerative change of the spine with bone-on-bone apposition of the L4-L5 space   Small fat and bowel containing umbilical hernia without evidence of strangulation   Numerous calcifications and soft tissue nodules along the gluteal subcutaneous tissues     Follow Up    Important Information and Numbers:      [facility], Division of General and Acute Care Surgery  [address]  [facility] - First Floor  [place], [place]  [address]  During regular business hours: (Monday-Friday, 8am-4:30 pm) call,     -Contact Information for Nurses:  [phone]: Margo Shells  [phone]: Josephine Nicolas (Dr. Esaw Heckler)  [phone]: Office (if you need to change your OR Date)  Fax: [phone]    Clinic Number: To schedule, reschedule or cancel an appointment:  [phone]     During Nights and Weekends will need to reach Resident on Call: [phone]    If you have any FMLA or other paperwork that needs filled out related to work, school, etc., that hasn't been filled out during your hospitalization, please fax to the number listed above [phone]) instead of bringing to your clinic appointment and this will help decrease any delays in paperwork completion.    No follow up with Trauma Surgery needed.  If having concerns please call to schedule an appointment.     Please note, if you arrive more than 20 minutes late, you will need to reschedule your appointment      *For support and information on substance abuse you may call the [facility]. It is free, confidential 24/7, 365 day-a-year treatment referral  and information service (in [place] and [place]) for individuals and families facing mental and/or substance use disorders. 1-800-662-HELP (4357).      Continue to see primary Urologist    Cardiology- Follow up has been requested.     Primary Care Physician   Notify your PCP of your admission. We have stopped your diltiazem , amlodipine , furosemide , and candesartan  medications during your hospitalization. You have not needed these medications for blood pressure or heart rate control while hospitalized. Please let your primary care provider know that these medications were paused, and they may resume them at their discretion. You will start taking a blood thinner called eliquis  because of your history of atrial fibrillation and pulmonary embolism. Your primary care provider should also be informed of this medication and take over management of this prescription.          Labs and Other Follow-ups after Discharge:  Follow Up instructions and Outpatient Referrals     Ambulatory Referral to Home Health      Reason for referral: S/p prolonged hospitalization.    Physician to follow patient's care: PCP    Disciplines requested:  Nursing  Physical Therapy  Occupational Therapy       Nursing requested: Wound Care    Wound count: Wound 2    Wound 1 type/staging: Sacrum moisture/friction injury    Wound 1 location: Sacrum    Wound 1 care orders: Moisture barrier cream, cleansing, zinc based   products    Wound 1 order frequency: daily    Wound 2 type/staging: Incisional wound, healing laparotomy incision    Wound 2 location: midline    Wound 2 care orders: Wet to dry dressings twice    Wound 2 order frequency: daily    Physical Therapy requested:  Home safety evaluation  Evaluate and treat       Occupational Therapy Requested:  Home safety evaluation  Evaluate and treat       Requested SOC Date:  Comment - Within 48 hours of discharge    Requested follow up plan: You would evaluate and manage.    Call MD for:  persistent nausea or vomiting      Call MD for:  redness, tenderness, or signs of infection (pain, swelling,   redness, odor or green/yellow discharge around incision site)      Call MD for:  severe uncontrolled pain      Call MD for: Temperature > 38.5 Celsius ( > 101.3 Fahrenheit)      Discharge instructions        Resources and Referrals       Commode      Length of Need: Lifetime    Type of commode: Chair    Hospital Bed (DME)      Type: Semi Electric    Length of Need: Lifetime    Height: 154.9 cm (5' 0.98)     Weight: 68.5 kg (151 lb 0.2 oz)        Height: 154.9 cm (5' 0.98)     Weight: 68.5 kg (151 lb 0.2 oz)    Patient Lift (DME)      Type: Standard Lift    Length of Need: Lifetime    Height: 154.9 cm (5' 0.98)     Weight: 68.5 kg (151 lb 0.2 oz)        Height: 154.9 cm (5' 0.98)     Weight: 68.5 kg (151 lb 0.2 oz)    Shower chair      Type:  With Back    Length of Need: Lifetime    Height: 154.9 cm (5' 0.98)     Weight: 68.5 kg (151 lb 0.2 oz)        Height: 154.9 cm (5' 0.98)     Weight: 68.5 kg (151 lb 0.2 oz)    Wheelchair      Type: Standard    Seat: 16x16    Length of Need: Lifetime    Height: 154.9 cm (5' 0.98)     Weight: 68.5 kg (151 lb 0.2 oz)        Height: 154.9 cm (5' 0.98)     Weight: 68.5 kg (151 lb 0.2 oz)          Future Appointments:  Appointments which have been scheduled for you      Apr 24, 2024 10:00 AM  (Arrive by 9:45 AM)  RETURN  STEP with Alyson Back, MD  [facility] PSYCHIATRY STEP [facility]) [address] [place] [address]  [phone]        May 27, 2024 2:20 PM  (Arrive by 2:05 PM)  PFT with PFT 1  [facility] [place]) [address]  [place] [address]  [place] [address]  [phone]   If you have inhaled breathing medications, please do not use it 4 hours prior to this breathing test.  Please wear comfortable clothing and well-fitting , closed toe shoes in the even that a 6-minute walk is part of your test.  If you have fallen in the past month please inform your respiratory therapist at the start of your appointment.    While you may bring a guest to your appointment, they will not be able to be present in the testing area.         May 27, 2024 3:00 PM  (Arrive by 2:45 PM)  RETURN  PULMONARY with Katelynn M Smith, MD  [facility] PULMONARY SPECIALTY [facility] [place] [facility]) [address]  [facility] [address]  [place] [address]  [phone]        Jul 10, 2024 10:00 AM  (Arrive by 9:30 AM)  NEW  GENERAL SURGERY with Sergio Mazzola Poli de Figueiredo, MD  [facility] GENERAL AND ACUTE CARE SURGERY  [facility]) [address]  [place] [address]  [phone]

## 2024-04-11 MED ADMIN — enoxaparin (LOVENOX) syringe 50 mg: 50 mg | SUBCUTANEOUS | @ 13:00:00

## 2024-04-11 MED ADMIN — carboxymethylcellulose sodium (THERATEARS) 0.25 % ophthalmic solution 2 drop: 2 [drp] | OPHTHALMIC | @ 13:00:00

## 2024-04-11 MED ADMIN — acetaminophen (TYLENOL) tablet 500 mg: 500 mg | ORAL | @ 22:00:00

## 2024-04-11 MED ADMIN — insulin NPH (HumuLIN,NovoLIN) injection 10 Units: 10 [IU] | SUBCUTANEOUS | @ 13:00:00

## 2024-04-11 MED ADMIN — methocarbamol (ROBAXIN) tablet 500 mg: 500 mg | ORAL | @ 01:00:00

## 2024-04-11 MED ADMIN — acetaminophen (TYLENOL) tablet 500 mg: 500 mg | ORAL | @ 16:00:00

## 2024-04-11 MED ADMIN — atorvastatin (LIPITOR) tablet 40 mg: 40 mg | ORAL | @ 13:00:00

## 2024-04-11 MED ADMIN — acetaminophen (TYLENOL) tablet 500 mg: 500 mg | ORAL | @ 10:00:00

## 2024-04-11 MED ADMIN — arformoterol (BROVANA) nebulizer solution 15 mcg/2 mL: 15 ug | RESPIRATORY_TRACT | @ 01:00:00

## 2024-04-11 MED ADMIN — insulin lispro (HumaLOG) injection 0-20 Units: 0-20 [IU] | SUBCUTANEOUS | @ 11:00:00

## 2024-04-11 MED ADMIN — arformoterol (BROVANA) nebulizer solution 15 mcg/2 mL: 15 ug | RESPIRATORY_TRACT | @ 13:00:00

## 2024-04-11 NOTE — Unmapped (Signed)
 Discharge Summary    Admit date: 02/26/2024    Discharge date and time: 04/14/24 at 4:20 PM    Discharge to:  Home    Discharge Service: Lamar Pillar [facility])    Discharge Attending Physician: Magdalena Scholz, MD    Discharge  Diagnoses: Small bowel obstruction     Interval Events:  -02/26/24: Admitted to [facility] floor; urology consult for possible ileal conduit revision.   -02/27/24: Patient level of care escalated to step-down d/t increased oxygen needs   -02/28/24: Upgraded to ICU. Intubated at bed-side. OR for ex-lap, reduction of parastomal hernia, small bowel resection with primary anastomosis, lysis of adhesions, abdomen left open with ABThera in place.   -03/01/24: OR for attempt at abdominal closure. Left open due to desaturations during closure attempt. ABThera in place.  -03/03/24: RTOR for abdominal closure  -03/06/24: Vas cath placed, CRRT started   -03/09/24: Extubated to HFNC  -03/10/24: CRRT discontinued   -03/11/24: Re-intubated for hypoxic respiratory failure 2/2 mucus plug   -03/14/24 Extubated to HFNC   -03/15/24: Vas cath removed   -03/16/24: Placed on BiPap for complete collapse of L lung 2/2 probable mucus plugging  -03/19/24: Re-intubated for mucus plugging. Therapeutic bronch, BAL sent.   -03/22/24: Extubated to HFNC  -03/27/24: Transferred to stepdown  -04/03/24: Transferred to floor    Secondary Diagnosis: Principal Problem:    SBO (small bowel obstruction) (POA: Unknown)  Active Problems:    Tobacco use disorder (POA: Yes)  Resolved Problems:    * No resolved hospital problems. *      OR Procedures:    EXPLORATORY LAPAROTOMY, EXPLORATORY CELIOTOMY WITH OR WITHOUT BIOPSY(S)  Date  02/28/2024  -------------------  **Canceled**    REOPENING OF RECENT LAPAROTOMY  Date  02/29/2024  -------------------  **Canceled**    Midline - REOPENING OF RECENT LAPAROTOMY  Date  03/01/2024  -------------------    Midline - REOPENING OF RECENT LAPAROTOMY  Date  03/01/2024  -------------------    REOPENING OF RECENT LAPAROTOMY  Date  03/03/2024  -------------------     Ancillary Procedures: no procedures    Discharge Day Services: The patient was seen and examined by the Trauma Surgery team on the day of discharge. Vital signs and laboratory values were stable. Discharge plan was discussed, instructions were given and all questions answered.  No follow up with [facility] needed.  Follow up with cardiology requested and pt to follow up with home pcp and urologist.    Subjective   No acute events overnight. Pain Controlled. No fever or chills.    Objective   Patient Vitals for the past 8 hrs:   BP   04/14/24 0846 151/89     No intake/output data recorded.    Physical Exam:     -General:  Appropriate, comfortable and in no apparent distress.   -Neurological: Awakens to voice. Moves all 4 extremities spontaneously.   -Psych: speech appropriate, pleasant affect  -Cardiovascular: Regular rate and rhythm  -Pulmonary: Normal work of breathing. No accessory muscle use  -Abdomen: Soft, non-distended. Appropriately tender to palpation post- operatively. Midline incision c/d/i.  No purulence.  -Genitourinary: Ileal conduit urostomy pink and moist with good UOP.  -Extremities: Warm, well perfused, normal skin turgor.     Hospital Course:  Tammy Braun 83 y.o. female with PMH of bladder cancer for which she has an ileal conduit (2012), COPD (no O2 requirement at baseline), HTN, HLD, DM, CKD, prior CVA. Presented to the ER with c/o of projectile vomiting and abdominal  pain. CT A/P showing SBO with transition point at ileal conduit. Labs with leukocytosis iso hemoconcentration, AKI on CKD, mildly elevated venous lactate improved with resuscitation. Transferred to [facility] 2/28 for worsening hypoxic respiratory failure requiring intubation. Long and complicated stay in the [facility] in the post operative period including: failed extubation x2,  klebsiella pna resistant to zosyn , acute respiratory failure, recurrent SBO, feeding intolerance, and aspiration. - 02/26/24: Admitted to [facility], floor status, consult to urology for possible ileal conduit revision.   - 02/27/24: Patient level of care escalated to step-down iso increased oxygen needs while in ED.  - 02/28/24: Upgraded to ICU. Intubated at bed-side. OR for ex-lap, reduction of parastomal hernia, small bowel resection with primary anastomosis, lysis of adhesions, abdomen left open with ABThera in place.   - 03/01/24: OR for attempt at abdominal closure. Left open due to desaturations during closure attempt. ABThera in place.  - 03/03/24: RTOR for abdominal closure  -03/06/24: Vas cath placed, CRRT started   -03/09/24: Extubated to HFNC  -03/10/24: CRRT discontinued   - 03/11/24: Re-intubated for hypoxic respiratory failure 2/2 mucus plug   - 03/14/24 Extubated to HFNC   -03/15/24: Vas cath removed   -03/16/24: Placed on BiPap 2/2 aspiration and increased supplemental oxygen support/mucus plugging  -03/19/24: Re-intubated for mucus plugging. Therapeutic bronch, BAL sent.   -03/22/24: Extubated to HFNC    #Small bowel obstruction -Parastomal hernia: Imaging revealed evidence of bowel obstruction and a nasogastric tube was placed. The patient was taken to the OR 2/28 for ex-lap, reduction of parastomal hernia, small bowel resection with primary anastomosis, lysis of adhesions, abdomen left open with ABThera in place.   She was maintained on bowel rest with NG tube decompression and supportive care with anti-emetics, IV fluid resuscitation and electrolyte repletion.  Taken to the OR on 02/28/24 for ex-lap, reduction of parastomal hernia, small bowel resection with lysis of adhesions. Abthera placed and left open. Returned to OR 03/01/24 for attempt closure, unable to close due to desaturations while attempting to close. Returned to OR 03/03/24 for abdominal closure.      TF started via NG tube, intially the patient did not tolerate. CT A/P obtained 03/06/24 which demonstrated mild dilation of multiple fluid-filled loops of small bowel, concerning for recurrent SBO with transition point. Patient made NPO, bowel rest with NG tube decompression and supportive care resumed. Once having bowel movements TF were restarted but not able to maintain at goal rate iso emesis episodes which led to aspiration PNA and multi-pressor shock. In interm nutrition was supported with TPN.   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 This hernia has been present for many years dating back and largely unchanged from CT abdomen/pelvis in 2012 and markedly unchanged on CT from 3/14 from the CT obtained on initial presentation.   TF bridged from TPN. Currently able to tolerate TF. SLP consulted. Patient continues to be a high aspiration risk. MBSS completed and recommended patient trial regular diet. Corpak was removed on 4/2 after patient was tolerating enough oral intake    #Acute hypoxic respiratory failure: Transferred to ICU 02/28/24 for increasing oxygen needs and was intubated. Failed extubation x2. Last extubation 3/23 and weaned to Nasal Canula. Daily chest xray. Diuresis was given on an as needed basis until patient was weaned to RA.  Pt intermittently placed back on nasal cannula per family preference, but prior to  placing on oxygen O2 sats remained stable, not indicating O2 supplementation.     #Urinary tract infection: UA dirty, urine culture showed Mixed GPO/GNO. Ceftriaxone started on 2/26 for 5 days (2/26-3/3). Per SRU, patient is colonizied so will probably have a + urine culture.  Treated for UTI given foul smelling urine. Re culture from 3/13 NGTD.    #AKI: AKI from ATN 2/2 to volume depletion from vomiting + hypotension + CIN. Baseline Cr around 1.05.  Urine lytes sent on 2/27. Showed FeNA score of 1.9%. Creatinine trended up and 3/7 required CRRT. AKI resolved as evidenced by down trending creatinine and by discharge was WNL. #Mild bilateral hydroureteronephrosis: SRU consulted on 2/27, recommended no acute surgical intervention, re-consider re-imaging based on trend.   Urology re-engaged when decision to take to OR.  Intra-op urology interrogated stoma with no signs of leak. Catheter placed in stoma with return of UOP. Creatinine at baseline, foley out.      #Afib-flutter w/ RVR / Hx of paroxysmal Afib, CHADSVASC score of 6:  - Was taking diltiazem outpatient; previously held due to likely poor absorption in the setting of SBO.  Amiodarone started in ICU. Cardiology formally consulted in stepdown, and recommend continuining PO amiodarone 150-200 mg daily but not needed at discharge. Amiodarone dose decreased down to 100 mg on 3/30 per daughter request. Patient HR elevated 4/1 and amiodarone was increased to 150mg  per daughter request but OK per cardiology. By discharge was off amiodarone.  Transitioned from heparin gtt to lovenox on 3/30 and then Eliquis on 04/10/24.     # PE:   - Filling defect in L anterior segmental artery on 3/5 CTA chest. Heparin gtt converted to therapeutic lovenox and then Eliquis as above.     #Constipation: Patient unable to have BM and increased stool burden visualized on CXR. Received aggressive BR while admitted and was having regular BM by discharge.      She is voiding adequately, debilitated requiring PT/ OT, and her pain was controlled wth PO pain medications. She was examined by the Trauma Surgery team on the day of discharge and was deemed suitable for discharge home. She will be discharged on POD #47 in good condition.    I spent greater than 30 minutes completing this discharge.     Condition at Discharge: Improved  Discharge Medications:      Medication List      PAUSE taking these medications     amlodipine 10 MG tablet; Wait to take this until your doctor or other   care provider tells you to start again.; Commonly known as: NORVASC   furosemide 20 MG tablet; Wait to take this until your doctor or other   care provider tells you to start again.; Commonly known as: LASIX; TAKE 2   TABLETS BY MOUTH DAILY FOR 5 DAYS, THEN 1 TABLET DAILY     START taking these medications     acetaminophen 500 MG tablet; Commonly known as: TYLENOL; Take 1 tablet   (500 mg total) by mouth every six (6) hours.   apixaban 5 mg Tab; Commonly known as: ELIQUIS; Take 1 tablet (5 mg   total) by mouth two (2) times a day.   oxyCODONE 5 MG immediate release tablet; Commonly known as: ROXICODONE;   Take 1 tablet (5 mg total) by mouth every four (4) hours as needed for up   to 5 days.   polyethylene glycol 17 gram packet; Commonly known as: MIRALAX; Take 17  g by mouth two (2) times a day.   senna 8.6 mg tablet; Commonly known as: SENOKOT; Take 1 tablet by mouth   nightly.     CONTINUE taking these medications     albuterol  90 mcg/actuation inhaler; Commonly known as: PROVENTIL    HFA;VENTOLIN  HFA; Inhale 2 puffs every four (4) hours as needed for   wheezing.   ASPERCREME TOP   atorvastatin  40 MG tablet; Commonly known as: LIPITOR ; Take 1 tablet (40   mg total) by mouth daily.   blood-glucose meter kit; Disp. blood glucose meter kit preferred by   patient's insurance. Check blood sugars as directed by provider. Dx:   Diabetes, E11.9   cyclobenzaprine  5 MG tablet; Commonly known as: FLEXERIL ; Take 1 tablet   (5 mg total) by mouth two (2) times a day as needed for muscle spasms.   fluPHENAZine  decanoate 25 mg/mL injection; Commonly known as: PROLIXIN ;   Inject 2 mL (50 mg total) into the muscle every thirty (30) days.   gabapentin  100 MG capsule; Commonly known as: NEURONTIN ; Take 1 capsule   (100 mg total) by mouth Three (3) times a day.   glucose blood test strip; Generic drug: blood sugar diagnostic; Disp   test strips preferred by insurance plan. 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   lancets Misc; Disp. lancets #30 or amount allowed, Testing once Qday.   Dx: E11.9 (Diabetes- controlled)   * MEDICAL SUPPLY ITEM; Entreal formula nutritionally supplement complete   caloric density   * MEDICAL SUPPLY ITEM; Compression stockings knee high  * This list has 2 medication(s) that are the same as other medications   prescribed for you. Read the directions carefully, and ask your doctor or   other care provider to review them with you.     STOP taking these medications     candesartan  16 MG tablet; Commonly known as: ATACAND    dilTIAZem  120 MG 24 hr capsule; Commonly known as: CARDIZEM  CD   traMADol  50 mg tablet; Commonly known as: ULTRAM        Pending Test Results:     Discharge Instructions:  Activity:   Activity Instructions       Activity as tolerated      Lifting restrictions (specify)      Weight restriction of 20 lbs.    OK to shower (no bath)            Diet:  Diet Instructions       Discharge diet (specify)      Discharge Nutrition Therapy: Regular          Other Instructions:  Other Instructions       Call MD for:  persistent nausea or vomiting      Call MD for:  redness, tenderness, or signs of infection (pain, swelling, redness, odor or green/yellow discharge around incision site)      Call MD for:  severe uncontrolled pain      Call MD for: Temperature > 38.5 Celsius ( > 101.3 Fahrenheit)      Discharge instructions      Instructions    DIET: You may eat the same night of your surgery.  We encourage you to eat light meals for the first 24 hours because the anesthesia may make you nauseated.  You may advance your diet to regular,  as tolerated. You may experience bloating or have loose, watery stool for several days after surgery.    ACTIVITY: You may shower in 48 hours (2 days) after the procedure, but please avoid baths, hot tubs, pools, soaking or sitting in water  for 2 weeks.  Please allow the soap and water  to run over your incision.  Do not scrub your incision.       No lifting greater than 10 pounds or strenuous activity for 2 weeks then nothing over 20 pounds from 2-6 weeks. If feeling pulling at incision back off.  Continue restrictions until 04/14/24.    INCISION: Look at your incisions at least twice a day. A small amount of drainage is normal, especially in the first couple of days after surgery. If you notice any redness around your incisions that spreads along your skin, or any thick, yellow drainage, you should call the clinic. The surgical glue that was placed over your incisions will dissolve naturally over time. Do not put any ointments or cream over the glue, and do not pull the glue off or pick at it. Avoid wearing tight clothing.    PAIN MEDICATION: Do not drive or drink alcohol while taking prescription pain medication. Take your prescribed pain medication only as needed. You may decrease the amount of pain medication as tolerated, and take Tylenol  or a Motrin  product as directed on the label if have tolerated these medications before and have not been told by another provider not to take them.  Pain medication can cause constipation. To avoid this, you should take an over-the-counter stool softener, such as docusate 100mg  1-2 times daily.     Effective June 28, 2016, the Samie [place]s signed a [place]  law that works to address the opioid crisis our state is facing.  It is called the STOP Law, for Strengthen Opioid Misuse Prevention.       As part of this law, we are limited to prescribing no more than a seven-day supply of opioid pain medicine for our surgery patients initally.  (This law also limits prescriptions for patients who have not had surgery to a five-day supply.)     If you are in need of additional pain control, the best number to call is [phone] before your current medications run out.  For after hours and weekends, please call [phone] and ask to speak with the surgery resident on call. The on-call provider may NOT be able to renew your prescription.     The STOP Act also monitors the amount of controlled substances that each patient has received from any and all providers.  Prescriptions for each patient will be monitored by the [place] Controlled Substances Reporting System in accordance with the law.  Thank you for working with us  so we can provide good pain control in the safest way possible while you recover.     Please note that prescription pain medication refills will not be called into your pharmacy. If you feel that you are needing more pain medication, you will need to be seen in clinic or the Emergency Department for further evaluation to ensure you are not experiencing a complication. Please take note of how many pills you have left in your bottle, and if you are starting to run low and feel that you will need more for adequate pain control, to call the clinic as soon as possible to schedule an appointment, as our clinics are very busy, and we cannot guarantee same week appointments.    The  Trauma Surgery office number is [phone]. For emergencies at night or on the week-end, please call [phone]) and ask for the surgery resident on call.    WHEN TO CALL THE SURGEON'S OFFICE:  1. Thick, yellow drainage from your incisions, redness at incisions, bleeding, or separations of wounds.  2. Temperature greater than 101.5  3. Uncontrolled nausea or vomiting.  4. Pain that is not controlled by your pain medication     Standard Patient Notification Script  We care deeply about how our patients' pain is managed after surgery.  During the month after you have surgery, you may receive a call from us  asking about your postsurgical pain and about how you managed that pain.  The survey will take approximately 5 to 10 minutes and will help us  to provide better care to our patients.  Thank you in advance for your participation and please answer any calls coming from the [facility] or [place] area!    Follow up with Primary Care Physician to inform him or her of the admission and to discuss the following incidental findings noted on CT scan:  Large right complex hiatal hernia  Dilated main pulmonary artery measuring 3.7 cm   Extensive coronary artery calcifications.   Mass effect on the heart from the large mixed hiatal/paraesophageal hernia.   Scattered calcified granulomata.   Common bile duct dilation measuring 1.2 cm.   Mild intrahepatic biliary ductal dilatation, unchanged compared to prior.   Diffuse fatty atrophy pancreas  Thickening of the bilateral adrenal glands.   Multiple unchanged renal cysts which were previously characterized on MRI 01/24/2024   Colonic diverticulosis  Extensive atherosclerotic disease   Multilevel degenerative change of the spine with bone-on-bone apposition of the L4-L5 space   Small fat and bowel containing umbilical hernia without evidence of strangulation   Numerous calcifications and soft tissue nodules along the gluteal subcutaneous tissues     Follow Up    Important Information and Numbers:      [facility], Division of General and Acute Care Surgery  [address]  [facility] - First Floor  [place], [place]  [address]  During regular business hours: (Monday-Friday, 8am-4:30 pm) call,     -Contact Information for Nurses:  [phone]: Margo Shells  [phone]: Josephine Nicolas (Dr. Esaw Heckler)  [phone]: Office (if you need to change your OR Date)  Fax: [phone]    Clinic Number: To schedule, reschedule or cancel an appointment:  [phone]     During Nights and Weekends will need to reach Resident on Call: [phone]    If you have any FMLA or other paperwork that needs filled out related to work, school, etc., that hasn't been filled out during your hospitalization, please fax to the number listed above [phone]) instead of bringing to your clinic appointment and this will help decrease any delays in paperwork completion.    No follow up with Trauma Surgery needed.  If having concerns please call to schedule an appointment.     Please note, if you arrive more than 20 minutes late, you will need to reschedule your appointment      *For support and information on substance abuse you may call the [facility]. It is free, confidential 24/7, 365 day-a-year treatment referral and information service (in [place] and [place]) for individuals and families facing mental and/or substance use disorders. 1-800-662-HELP (4357).      Continue to see primary Urologist    Cardiology- Follow up has  been requested.     Primary Care Physician   Notify your PCP of your admission. We have stopped your diltiazem , amlodipine , furosemide , and candesartan  medications during your hospitalization. You have not needed these medications for blood pressure or heart rate control while hospitalized. Please let your primary care provider know that these medications were paused, and they may resume them at their discretion. You will start taking a blood thinner called eliquis  because of your history of atrial fibrillation and pulmonary embolism. Your primary care provider should also be informed of this medication and take over management of this prescription.          Labs and Other Follow-ups after Discharge:  Follow Up instructions and Outpatient Referrals     Ambulatory Referral to Home Health      Reason for referral: S/p prolonged hospitalization.    Physician to follow patient's care: PCP    Disciplines requested:  Nursing  Physical Therapy  Occupational Therapy       Nursing requested: Wound Care    Wound count: Wound 2    Wound 1 type/staging: Sacrum moisture/friction injury    Wound 1 location: Sacrum    Wound 1 care orders: Moisture barrier cream, cleansing, zinc based   products    Wound 1 order frequency: daily    Wound 2 type/staging: Incisional wound, healing laparotomy incision    Wound 2 location: midline    Wound 2 care orders: Wet to dry dressings twice    Wound 2 order frequency: daily    Physical Therapy requested:  Home safety evaluation  Evaluate and treat       Occupational Therapy Requested:  Home safety evaluation  Evaluate and treat       Requested SOC Date:  Comment - Within 48 hours of discharge    Requested follow up plan: You would evaluate and manage.    Call MD for:  persistent nausea or vomiting      Call MD for:  redness, tenderness, or signs of infection (pain, swelling,   redness, odor or green/yellow discharge around incision site)      Call MD for:  severe uncontrolled pain      Call MD for: Temperature > 38.5 Celsius ( > 101.3 Fahrenheit)      Discharge instructions        Resources and Referrals       Commode      Length of Need: Lifetime    Type of commode: Chair    Home Medical Equipment      Length of Need: Lifetime    Specify: Bedpan    Height: 154.9 cm (5' 0.98)     Weight: 68.5 kg (151 lb 0.2 oz)        Height: 154.9 cm (5' 0.98)     Weight: 68.5 kg (151 lb 0.2 oz)    Home Medical Equipment      Length of Need: Lifetime    Specify: White underpads    Height: 154.9 cm (5' 0.98)     Weight: 68.5 kg (151 lb 0.2 oz)        Height: 154.9 cm (5' 0.98)     Weight: 68.5 kg (151 lb 0.2 oz)    Home Medical Equipment      Length of Need: Lifetime    Specify: Low air loss mattress    Height: 154.9 cm (5' 0.98)     Weight: 68.5 kg (151 lb 0.2 oz)  Height: 154.9 cm (5' 0.98)     Weight: 68.5 kg (151 lb 0.2 oz)    Hospital Bed (DME)      Type: Full Electric    Length of Need: Lifetime    Height: 154.9 cm (5' 0.98)     Weight: 68.5 kg (151 lb 0.2 oz)    Semi-electric is fine if full electric is unavailable. Low air-loss mattress preferred.        Height: 154.9 cm (5' 0.98)     Weight: 68.5 kg (151 lb 0.2 oz)    Semi-electric is fine if full electric is unavailable. Low air-loss mattress preferred.    Nebulizer      Patient Lift (DME)      Type: Standard Lift    Length of Need: Lifetime    Height: 154.9 cm (5' 0.98)     Weight: 68.5 kg (151 lb 0.2 oz)    This order is for an electric lift.        Height: 154.9 cm (5' 0.98)     Weight: 68.5 kg (151 lb 0.2 oz)    This order is for an electric lift.    Shower chair      Type: With Back    Length of Need: Lifetime    Height: 154.9 cm (5' 0.98)     Weight: 68.5 kg (151 lb 0.2 oz)        Height: 154.9 cm (5' 0.98)     Weight: 68.5 kg (151 lb 0.2 oz)    Wheelchair      Type: Standard    Seat: 16x16    Length of Need: Lifetime    Height: 154.9 cm (5' 0.98)     Weight: 68.5 kg (151 lb 0.2 oz)    This request is for a transport chair, not for a manual wheelchair. Please size transport chair as appropriate for patient.        Height: 154.9 cm (5' 0.98)     Weight: 68.5 kg (151 lb 0.2 oz)    This request is for a transport chair, not for a manual wheelchair. Please size transport chair as appropriate for patient.          Future Appointments:  Appointments which have been scheduled for you      Apr 24, 2024 10:00 AM  (Arrive by 9:45 AM)  RETURN  STEP with Alyson Back, MD  [facility] PSYCHIATRY STEP [facility]) [address] [place]  [place] [address]  [phone]        May 27, 2024 2:20 PM  (Arrive by 2:05 PM)  PFT with PFT 1  [facility] PULMONARY SPECIALTY FUNCT [place]) [address]  [place] 1 through 4  [place] [address]  [phone]   If you have inhaled breathing medications, please do not use it 4 hours prior to this breathing test.  Please wear comfortable clothing and well-fitting , closed toe shoes in the even that a 6-minute walk is part of your test.  If you have fallen in the past month please inform your respiratory therapist at the start of your appointment.    While you may bring a guest to your appointment, they will not be able to be present in the testing area.         May 27, 2024 3:00 PM  (Arrive by 2:45 PM)  RETURN  PULMONARY with Katelynn M Smith, MD  [facility] PULMONARY SPECIALTY CL  [place] [facility]) [address]  [facility] 1 through 4  [place] [address]  [phone]        Jul 10, 2024 10:00 AM  (Arrive by 9:30 AM)  NEW  GENERAL SURGERY with Sergio Mazzola Poli de Figueiredo, MD  [facility] GENERAL AND ACUTE CARE SURGERY [facility]) [address]  [place] [address]  [phone]

## 2024-04-11 NOTE — Unmapped (Signed)
 [place] TRAUMA, ACUTE CARE, and GENERAL SURGERY   - Surgery Daily Progress Note -  04/11/2024     Admit Date: 02/26/2024, Hospital Day: 24  Hospital Service: Lamar Pillar [facility])  Attending: [name], MD  [facility] - SRH-4  General / Trauma Surgery - Trauma/CC     Assessment     Tammy Braun 83 y.o. female, PMH bladder cancer with cystectomy and ileal conduit, COPD, recent dx of afib, moderate-severe TR, CAD (high calcification score), T2DM presented to [facility] with projectile vomiting and abdominal pain, found to have SBO with transition point at level of ileostomy on CT A/P. OR on 02/28/24 for ex-lap, reduction of parastomal hernia, small bowel resection with primary anastomosis and lysis of adhesions.     Interval Events:  -02/26/24: Admitted to [facility] floor; urology consult for possible ileal conduit revision.   -02/27/24: Patient level of care escalated to step-down d/t increased oxygen needs   -02/28/24: Upgraded to ICU. Intubated at bed-side. OR for ex-lap, reduction of parastomal hernia, small bowel resection with primary anastomosis, lysis of adhesions, abdomen left open with ABThera in place.   -03/01/24: OR for attempt at abdominal closure. Left open due to desaturations during closure attempt. ABThera in place.  -03/03/24: RTOR for abdominal closure  -03/06/24: Vas cath placed, CRRT started   -03/09/24: Extubated to HFNC  -03/10/24: CRRT discontinued   -03/11/24: Re-intubated for hypoxic respiratory failure 2/2 mucus plug   -03/14/24 Extubated to HFNC   -03/15/24: Vas cath removed   -03/16/24: Placed on BiPap for complete collapse of L lung 2/2 probable mucus plugging  -03/19/24: Re-intubated for mucus plugging. Therapeutic bronch, BAL sent.   -03/22/24: Extubated to HFNC  -03/27/24: Transferred to stepdown  -04/03/24: Transferred to floor    Subjective   NAEO. Just finished up breakfast; ate grits and eggs.     Plan     Neuro:   - PRN APAP    *Insomnia  - Melatonin PRN    *Schizoaffective disorder  - Continue Prolixin  monthly (last dose 3/14)     CV:   *Hx HTN   - HDS  - Holding home amlodipine  given low/normal pressures     *HFpEF, pulmonary HTN   - Echo (3/12) EF 70%, severe pulmonary HTN     *Hx of HLD  - Continue lipitor  40mg  daily    *Afib-flutter w/ RVR  *Hx of paroxysmal Afib  - Was taking diltiazem  outpatient; previously held due to likely poor absorption and started on amiodarone  in ICU.  - Cardiology consulted, appreciate assistance:    > Can discontinue amiodarone      > Resume DOAC as able    > Page on DC for follow-up - Done.    Resp:   - Not requiring supplemental oxygen. Family occasionally applies oxygen to patient for comfort. Patient maintains saturations >88% without supplemental oxygen.    *Acute on chronic hypoxemia, hx COPD  *Pulmonary edema  *Pulmonary HTN   - Continue pulmonary toilet: OOB and IS   - Continue atrovent  q6h, brovana  BID     *Pulmonary embolism, segmental  - CTA chest (3/4) filling defect in L anterior segmental artery    - Heparin  gtt transitioned to therapeutic lovenox  3/30. Start on Eliquis  at discharge.    FEN/GI:   - Fluids: ML   - Replete lytes PRN  - N: Regular diet with food cut up in small pieces. Cal count in place. Supplements ordered.   - Severe Protein-Calorie Malnutrition in the context of  acute illness or injury (03/10/24 0901)  Fat Loss: Moderate  Muscle Loss: Severe  Fluid Accumulation: Moderate  Malnutrition Score: 3  - PRN Zofran     *S/p ex-lap, reduction of parastomal hernia, small bowel resection with primary anastomosis, lysis of adhesions   - WTD dressing changes daily per nursing to midline incision    *Large hiatal hernia, chronic  - re-eval on CT chest/abd/pelvis 3/13 stable    GU/Renal:   - Adequate UOP.   - Cr WNL  - Ileal conduit viable with adequate UOP    *AKI on CKD (resolved)  - Cr stable now  - Diuresis as needed    *H/o bladder cancer s/p radical cystectomy with ileal conduit (2012)  *Parastomal hernia  - Strict I&Os  - Adequate UOP from stoma    Heme  - Hgb stable    ID:   - Afebrile.  - WBC- No leukocytosis.  - C-Diff negative 3/26    Endocrine:   - Continue NPH 10 units BID  - Regular SSI q6hr   - POCT q4h    PPx:   Therapeutic lovenox     Dispo: Floor status  PT: 5XL  OT: 5XL  ST: 5X  Barriers to discharge:  None  CM/SW assisting in discharge planning.   - Medically ready for dispo, CM assisting with SNF placement vs. home    Contact: SRH-4 Pg. [phone]  Mathias Solon, MD    Objective     Vitals:   Temp:  [36.7 ??C (98.1 ??F)-36.9 ??C (98.4 ??F)] 36.7 ??C (98.1 ??F)  Heart Rate:  [55-88] 60  Resp:  [18] 18  BP: (143-162)/(69-105) 162/96  MAP (mmHg):  [89-121] 114  SpO2:  [91 %-92 %] 92 %    Intake/Output last 24 hours:  I/O last 3 completed shifts:  In: 500 [P.O.:500]  Out: 1750 [Urine:1750]    Physical Exam:    -General:  Appropriate, comfortable and in no apparent distress.   -Neurological: Awakens to voice. Moves all 4 extremities spontaneously.   -Psych: speech appropriate, pleasant affect  -Cardiovascular: Regular rate and rhythm  -Pulmonary: Normal work of breathing. No accessory muscle use  -Abdomen: Soft, non-distended. Appropriately tender to palpation post- operatively. Midline incision with dressing place.  No purulence.   -Genitourinary: Ileal conduit urostomy pink and moist with good UOP.  -Extremities: Warm, well perfused, normal skin turgor.     Data Review:    I have reviewed the labs and studies from the last 24 hours.    Imaging: Radiology studies were personally reviewed

## 2024-04-11 NOTE — Unmapped (Signed)
 [place] TRAUMA, ACUTE CARE, and GENERAL SURGERY   - Surgery Daily Progress Note -  04/11/2024     Admit Date: 02/26/2024, Hospital Day: 69  Hospital Service: Lamar Pillar [facility])  Attending: [name], MD   [facility] - [facility]  General / Trauma Surgery - Trauma/CC     Assessment     Francis Irons 83 y.o. female, PMH bladder cancer with cystectomy and ileal conduit, COPD, recent dx of afib, moderate-severe TR, CAD (high calcification score), T2DM presented to [facility] with projectile vomiting and abdominal pain, found to have SBO with transition point at level of ileostomy on CT A/P. OR on 02/28/24 for ex-lap, reduction of parastomal hernia, small bowel resection with primary anastomosis and lysis of adhesions.     Interval Events:  -02/26/24: Admitted to [facility] floor; urology consult for possible ileal conduit revision.   -02/27/24: Patient level of care escalated to step-down d/t increased oxygen needs   -02/28/24: Upgraded to ICU. Intubated at bed-side. OR for ex-lap, reduction of parastomal hernia, small bowel resection with primary anastomosis, lysis of adhesions, abdomen left open with ABThera in place.   -03/01/24: OR for attempt at abdominal closure. Left open due to desaturations during closure attempt. ABThera in place.  -03/03/24: RTOR for abdominal closure  -03/06/24: Vas cath placed, CRRT started   -03/09/24: Extubated to HFNC  -03/10/24: CRRT discontinued   -03/11/24: Re-intubated for hypoxic respiratory failure 2/2 mucus plug   -03/14/24 Extubated to HFNC   -03/15/24: Vas cath removed   -03/16/24: Placed on BiPap for complete collapse of L lung 2/2 probable mucus plugging  -03/19/24: Re-intubated for mucus plugging. Therapeutic bronch, BAL sent.   -03/22/24: Extubated to HFNC  -03/27/24: Transferred to stepdown  -04/03/24: Transferred to floor    Subjective   NAEO. Starlit was receiving a bath this AM during rounds. Asking to have her urostomy pouch replaced.    Plan     Neuro:   - PRN APAP    *Insomnia  - Melatonin PRN    *Schizoaffective disorder  - Continue Prolixin  monthly (last dose 3/14)     CV:   *Hx HTN   - HDS  - Holding home amlodipine  given low/normal pressures     *HFpEF, pulmonary HTN   - Echo (3/12) EF 70%, severe pulmonary HTN     *Hx of HLD  - Continue lipitor  40mg  daily    *Afib-flutter w/ RVR  *Hx of paroxysmal Afib  - Was taking diltiazem  outpatient; previously held due to likely poor absorption and started on amiodarone  in ICU.  - Cardiology consulted, appreciate assistance:    > Can discontinue amiodarone      > Resume DOAC as able    > Page on DC for follow-up - Done.    Resp:   - Not requiring supplemental oxygen. Family occasionally applies oxygen to patient for comfort. Patient maintains saturations >88% without supplemental oxygen.    *Acute on chronic hypoxemia, hx COPD  *Pulmonary edema  *Pulmonary HTN   - Continue pulmonary toilet: OOB and IS   - Continue atrovent  q6h, brovana  BID     *Pulmonary embolism, segmental  - CTA chest (3/4) filling defect in L anterior segmental artery    - Heparin  gtt transitioned to therapeutic lovenox  3/30. Start on Eliquis  at discharge.    FEN/GI:   - Fluids: ML   - Replete lytes PRN  - N: Regular diet with food cut up in small pieces. Cal count in place. Supplements ordered.   -  Severe Protein-Calorie Malnutrition in the context of acute illness or injury (03/10/24 0901)  Fat Loss: Moderate  Muscle Loss: Severe  Fluid Accumulation: Moderate  Malnutrition Score: 3  - PRN Zofran     *S/p ex-lap, reduction of parastomal hernia, small bowel resection with primary anastomosis, lysis of adhesions   - WTD dressing changes daily per nursing to midline incision    *Large hiatal hernia, chronic  - re-eval on CT chest/abd/pelvis 3/13 stable    GU/Renal:   - Adequate UOP.   - Cr WNL  - Ileal conduit viable with adequate UOP    *AKI on CKD (resolved)  - Cr stable now  - Diuresis as needed    *H/o bladder cancer s/p radical cystectomy with ileal conduit (2012)  *Parastomal hernia  - Strict I&Os  - Adequate UOP from stoma    Heme  - Hgb stable    ID:   - Afebrile.  - WBC- No leukocytosis.  - C-Diff negative 3/26    Endocrine:   - Continue NPH 10 units BID  - Regular SSI q6hr   - POCT q4h    PPx:   Therapeutic lovenox     Dispo: Floor status  PT: 5XL  OT: 5XL  ST: 5X  Barriers to discharge:  None  CM/SW assisting in discharge planning.   - Medically ready for dispo, CM assisting with SNF placement vs. home    Contact: SRH-4 Pg. [phone]  Mathias Solon, MD    Objective     Vitals:   Temp:  [36.7 ??C (98.1 ??F)-36.9 ??C (98.4 ??F)] 36.7 ??C (98.1 ??F)  Heart Rate:  [55-88] 60  Resp:  [18] 18  BP: (143-162)/(69-105) 162/96  MAP (mmHg):  [89-121] 114  SpO2:  [91 %-92 %] 92 %    Intake/Output last 24 hours:  I/O last 3 completed shifts:  In: 500 [P.O.:500]  Out: 1750 [Urine:1750]    Physical Exam:    -General:  Appropriate, comfortable and in no apparent distress.   -Neurological: Awakens to voice. Moves all 4 extremities spontaneously.   -Psych: speech appropriate, pleasant affect  -Cardiovascular: Regular rate and rhythm  -Pulmonary: Normal work of breathing. No accessory muscle use  -Abdomen: Soft, non-distended. Appropriately tender to palpation post- operatively. Midline incision with dressing place.  No purulence.  -Genitourinary: Ileal conduit urostomy pink and moist with good UOP.  -Extremities: Warm, well perfused, normal skin turgor.     Data Review:    I have reviewed the labs and studies from the last 24 hours.    Imaging: Radiology studies were personally reviewed

## 2024-04-11 NOTE — Unmapped (Signed)
 VSS. A&Ox4. Multiple bms. Pain managed.     Bed locked and low, call light within reach.       Problem: Adult Inpatient Plan of Care  Goal: Plan of Care Review  Outcome: Progressing  Flowsheets (Taken 04/11/2024 1408)  Progress: improving  Goal: Patient-Specific Goal (Individualized)  Outcome: Progressing  Goal: Absence of Hospital-Acquired Illness or Injury  Outcome: Progressing  Intervention: Identify and Manage Fall Risk  Recent Flowsheet Documentation  Taken 04/11/2024 1400 by Hosie Macadamia, RN  Safety Interventions:   lighting adjusted for tasks/safety   low bed  Taken 04/11/2024 1000 by Hosie Macadamia, RN  Safety Interventions:   lighting adjusted for tasks/safety   low bed  Taken 04/11/2024 0800 by Hosie Macadamia, RN  Safety Interventions:   low bed   lighting adjusted for tasks/safety  Intervention: Prevent Skin Injury  Recent Flowsheet Documentation  Taken 04/11/2024 0800 by Hosie Macadamia, RN  Positioning for Skin: Supine/Back  Device Skin Pressure Protection: absorbent pad utilized/changed  Skin Protection:   adhesive use limited   incontinence pads utilized  Intervention: Prevent and Manage VTE (Venous Thromboembolism) Risk  Recent Flowsheet Documentation  Taken 04/11/2024 1400 by Hosie Macadamia, RN  Anti-Embolism Device Status: Refused  Taken 04/11/2024 1200 by Hosie Macadamia, RN  Anti-Embolism Device Status: Refused  Taken 04/11/2024 1000 by Hosie Macadamia, RN  Anti-Embolism Device Status: Refused  Taken 04/11/2024 0800 by Hosie Macadamia, RN  Anti-Embolism Device Status: Refused  Goal: Optimal Comfort and Wellbeing  Outcome: Progressing  Goal: Readiness for Transition of Care  Outcome: Progressing  Goal: Rounds/Family Conference  Outcome: Progressing     Problem: Infection  Goal: Absence of Infection Signs and Symptoms  Outcome: Progressing     Problem: Fall Injury Risk  Goal: Absence of Fall and Fall-Related Injury  Outcome: Progressing  Intervention: Promote Injury-Free Environment  Recent Flowsheet Documentation  Taken 04/11/2024 1400 by Hosie Macadamia, RN  Safety Interventions:   lighting adjusted for tasks/safety   low bed  Taken 04/11/2024 1000 by Hosie Macadamia, RN  Safety Interventions:   lighting adjusted for tasks/safety   low bed  Taken 04/11/2024 0800 by Hosie Macadamia, RN  Safety Interventions:   low bed   lighting adjusted for tasks/safety     Problem: Wound  Goal: Optimal Coping  Outcome: Progressing  Goal: Optimal Functional Ability  Outcome: Progressing  Goal: Absence of Infection Signs and Symptoms  Outcome: Progressing  Goal: Improved Oral Intake  Outcome: Progressing  Goal: Optimal Pain Control and Function  Outcome: Progressing  Goal: Skin Health and Integrity  Outcome: Progressing  Intervention: Optimize Skin Protection  Recent Flowsheet Documentation  Taken 04/11/2024 0800 by Hosie Macadamia, RN  Pressure Reduction Techniques: frequent weight shift encouraged  Pressure Reduction Devices: pressure-redistributing mattress utilized  Skin Protection:   adhesive use limited   incontinence pads utilized  Goal: Optimal Wound Healing  Outcome: Progressing     Problem: Skin Injury Risk Increased  Goal: Skin Health and Integrity  Outcome: Progressing  Intervention: Optimize Skin Protection  Recent Flowsheet Documentation  Taken 04/11/2024 0800 by Hosie Macadamia, RN  Pressure Reduction Techniques: frequent weight shift encouraged  Pressure Reduction Devices: pressure-redistributing mattress utilized  Skin Protection:   adhesive use limited   incontinence pads utilized     Problem: Self-Care Deficit  Goal: Improved Ability to Complete Activities of Daily Living  Outcome: Progressing     Problem: Malnutrition  Goal: Improved Nutritional  Intake  Outcome: Progressing     Problem: Breathing Pattern Ineffective  Goal: Effective Breathing Pattern  Outcome: Progressing

## 2024-04-11 NOTE — Unmapped (Signed)
 Pt A/O x4, forgetful. Afib and BS rhythm noted on tele; asymptomatic. MD notified. VSS otherwise. Regular diet and tolerating well. Denies need for PRN medications.     Dressing to abdomen changed per order.     Urostomy in place and assessed per protocol.     Family member at bedside for entirety of shift. Vocalizing questioning of care when nursing staff in room. Asking nursing staff to not follow wound care orders for sacrum and instead apply home medication; RN refused and educated pt/family member on wound care orders and necessity. Family member applied home medication and refused wound care order to be followed. Family member asking RN questions regarding case management, home health care, delaying discharge. This RN educated pt on scope of practice and notified family member/pt that she will relay message.     All belongings with patient. Bed is low and locked. Purposeful hourly monitoring maintained.

## 2024-04-12 MED ADMIN — ipratropium (ATROVENT) 0.02 % nebulizer solution 500 mcg: 500 ug | RESPIRATORY_TRACT | @ 14:00:00

## 2024-04-12 MED ADMIN — ipratropium (ATROVENT) 0.02 % nebulizer solution 500 mcg: 500 ug | RESPIRATORY_TRACT | @ 22:00:00

## 2024-04-12 MED ADMIN — carboxymethylcellulose sodium (THERATEARS) 0.25 % ophthalmic solution 2 drop: 2 [drp] | OPHTHALMIC | @ 14:00:00

## 2024-04-12 MED ADMIN — arformoterol (BROVANA) nebulizer solution 15 mcg/2 mL: 15 ug | RESPIRATORY_TRACT | @ 02:00:00

## 2024-04-12 MED ADMIN — cholecalciferol (vitamin D3 25 mcg (1,000 units)) tablet 50 mcg: 50 ug | ORAL | @ 17:00:00

## 2024-04-12 MED ADMIN — arformoterol (BROVANA) nebulizer solution 15 mcg/2 mL: 15 ug | RESPIRATORY_TRACT | @ 14:00:00

## 2024-04-12 MED ADMIN — senna (SENOKOT) tablet 1 tablet: 1 | ORAL | @ 02:00:00

## 2024-04-12 MED ADMIN — amlodipine (NORVASC) tablet 10 mg: 10 mg | ORAL | @ 14:00:00

## 2024-04-12 MED ADMIN — fluPHENAZine decanoate (PROLIXIN) injection 50 mg: 50 mg | INTRAMUSCULAR | @ 08:00:00 | Stop: 2024-10-09

## 2024-04-12 MED ADMIN — acetaminophen (TYLENOL) tablet 500 mg: 500 mg | ORAL | @ 22:00:00

## 2024-04-12 MED ADMIN — carboxymethylcellulose sodium (THERATEARS) 0.25 % ophthalmic solution 2 drop: 2 [drp] | OPHTHALMIC | @ 02:00:00

## 2024-04-12 MED ADMIN — enoxaparin (LOVENOX) syringe 50 mg: 50 mg | SUBCUTANEOUS | @ 14:00:00

## 2024-04-12 MED ADMIN — atorvastatin (LIPITOR) tablet 40 mg: 40 mg | ORAL | @ 14:00:00

## 2024-04-12 MED ADMIN — polyethylene glycol (MIRALAX) packet 17 g: 17 g | ORAL | @ 02:00:00

## 2024-04-12 MED ADMIN — insulin NPH (HumuLIN,NovoLIN) injection 10 Units: 10 [IU] | SUBCUTANEOUS | @ 22:00:00

## 2024-04-12 MED ADMIN — furosemide (LASIX) tablet 20 mg: 20 mg | ORAL | @ 17:00:00 | Stop: 2024-04-12

## 2024-04-12 MED ADMIN — insulin lispro (HumaLOG) injection 0-20 Units: 0-20 [IU] | SUBCUTANEOUS | @ 17:00:00

## 2024-04-12 MED ADMIN — ipratropium (ATROVENT) 0.02 % nebulizer solution 500 mcg: 500 ug | RESPIRATORY_TRACT | @ 02:00:00

## 2024-04-12 MED ADMIN — insulin lispro (HumaLOG) injection 0-20 Units: 0-20 [IU] | SUBCUTANEOUS | @ 22:00:00

## 2024-04-12 MED ADMIN — methocarbamol (ROBAXIN) tablet 500 mg: 500 mg | ORAL | @ 02:00:00

## 2024-04-12 MED ADMIN — acetaminophen (TYLENOL) tablet 500 mg: 500 mg | ORAL | @ 20:00:00

## 2024-04-12 NOTE — Unmapped (Signed)
 [place] TRAUMA, ACUTE CARE, and GENERAL SURGERY   - Surgery Daily Progress Note -  04/12/2024     Admit Date: 02/26/2024, Hospital Day: 42  Hospital Service: Lamar Pillar [facility])  Attending: [name], MD  [facility] - SRH-4  General / Trauma Surgery - Trauma/CC     Assessment     Tammy Braun 83 y.o. female, PMH bladder cancer with cystectomy and ileal conduit, COPD, recent dx of afib, moderate-severe TR, CAD (high calcification score), T2DM presented to [facility] with projectile vomiting and abdominal pain, found to have SBO with transition point at level of ileostomy on CT A/P. OR on 02/28/24 for ex-lap, reduction of parastomal hernia, small bowel resection with primary anastomosis and lysis of adhesions.     Interval Events:  -02/26/24: Admitted to [facility] floor; urology consult for possible ileal conduit revision.   -02/27/24: Patient level of care escalated to step-down d/t increased oxygen needs   -02/28/24: Upgraded to ICU. Intubated at bed-side. OR for ex-lap, reduction of parastomal hernia, small bowel resection with primary anastomosis, lysis of adhesions, abdomen left open with ABThera in place.   -03/01/24: OR for attempt at abdominal closure. Left open due to desaturations during closure attempt. ABThera in place.  -03/03/24: RTOR for abdominal closure  -03/06/24: Vas cath placed, CRRT started   -03/09/24: Extubated to HFNC  -03/10/24: CRRT discontinued   -03/11/24: Re-intubated for hypoxic respiratory failure 2/2 mucus plug   -03/14/24 Extubated to HFNC   -03/15/24: Vas cath removed   -03/16/24: Placed on BiPap for complete collapse of L lung 2/2 probable mucus plugging  -03/19/24: Re-intubated for mucus plugging. Therapeutic bronch, BAL sent.   -03/22/24: Extubated to HFNC  -03/27/24: Transferred to stepdown  -04/03/24: Transferred to floor    Subjective     Patient briefly hypertensive overnight; however, resolved with new cuff. Otherwise, VSS. Denies pain this AM. Asking when her urostomy pouch will be changed.      Plan Neuro:   - PRN APAP    *Insomnia  - Melatonin PRN    *Schizoaffective disorder  - Continue Prolixin  monthly (last dose 4/13)     CV:   *Hx HTN   - HDS  - Resume home amlodipine     *HFpEF, pulmonary HTN   - Echo (3/12) EF 70%, severe pulmonary HTN     *Hx of HLD  - Continue lipitor  40mg  daily    *Afib-flutter w/ RVR  *Hx of paroxysmal Afib  - Was taking diltiazem  outpatient; previously held due to likely poor absorption and started on amiodarone  in ICU.  - Cardiology consulted, appreciate assistance:    > Can discontinue amiodarone      > Resume DOAC as able    > Page on DC for follow-up - Done.    Resp:   - Not requiring supplemental oxygen. Family occasionally applies oxygen to patient for comfort. Patient maintains saturations >88% without supplemental oxygen.    *Acute on chronic hypoxemia, hx COPD  *Pulmonary edema  *Pulmonary HTN   - Continue pulmonary toilet: OOB and IS   - Continue atrovent  q6h, brovana  BID     *Pulmonary embolism, segmental  - CTA chest (3/4) filling defect in L anterior segmental artery    - Heparin  gtt transitioned to therapeutic lovenox  3/30. Start on Eliquis  at discharge.    FEN/GI:   - Fluids: ML   - Replete lytes PRN  - N: Regular diet with food cut up in small pieces. Cal count in place. Supplements ordered.   -  Severe Protein-Calorie Malnutrition in the context of acute illness or injury (03/10/24 0901)  Fat Loss: Moderate  Muscle Loss: Severe  Fluid Accumulation: Moderate  Malnutrition Score: 3  - PRN Zofran   - Frequent BMs, discontinue senna, miralax  to PRN from [facility].    *S/p ex-lap, reduction of parastomal hernia, small bowel resection with primary anastomosis, lysis of adhesions   - WTD dressing changes daily per nursing to midline incision    *Large hiatal hernia, chronic  - re-eval on CT chest/abd/pelvis 3/13 stable    GU/Renal:   - Adequate UOP.   - Cr WNL  - Ileal conduit viable with adequate UOP    *AKI on CKD (resolved)  - Cr stable now  - Diuresis as needed (20mg  PO today)    *H/o bladder cancer s/p radical cystectomy with ileal conduit (2012)  *Parastomal hernia  - Strict I&Os  - Adequate UOP from stoma    Heme  - Hgb stable    ID:   - Afebrile.  - WBC- No leukocytosis.  - C-Diff negative 3/26    Endocrine:   - Continue NPH 10 units BID  - Regular SSI q6hr   - POCT q4h    PPx:   Therapeutic lovenox     Dispo: Floor status  PT: 5XL  OT: 5XL  ST: 5X  Barriers to discharge:  None  CM/SW assisting in discharge planning.   - Medically ready for dispo, CM assisting with SNF placement vs. home    Contact: SRH-4 Pg. [phone]  Mathias Solon, MD    Objective     Vitals:   Temp:  [36.6 ??C (97.9 ??F)-36.8 ??C (98.2 ??F)] 36.6 ??C (97.9 ??F)  Heart Rate:  [59-66] 60  Resp:  [18-20] 18  BP: (162-214)/(72-101) 162/73  MAP (mmHg):  [98-134] 100  SpO2:  [91 %-96 %] 95 %    Intake/Output last 24 hours:  I/O last 3 completed shifts:  In: 200 [P.O.:200]  Out: 1051 [Urine:1050; Emesis/NG output:1]    Physical Exam:    -General:  Appropriate, comfortable and in no apparent distress.   -Neurological: Awakens to voice. Moves all 4 extremities spontaneously.   -Psych: speech appropriate, pleasant affect  -Cardiovascular: Regular rate and rhythm  -Pulmonary: Normal work of breathing. No accessory muscle use  -Abdomen: Soft, non-distended. Appropriately tender to palpation post- operatively. Midline incision with dressing place.  No purulence.  -Genitourinary: Ileal conduit urostomy pink and moist with good UOP.  -Extremities: Warm, well perfused, normal skin turgor.     Data Review:    I have reviewed the labs and studies from the last 24 hours.    Imaging: Radiology studies were personally reviewed

## 2024-04-12 NOTE — Unmapped (Signed)
 Patient has an episode of high blood pressure . MD made aware. After multiple assessments and MD coming bedside, PRN was chosen not to be given per MD. Patient blood pressure at a lower reading. Patient is denying pain. Patient urostomy c/d/I. Patient remains on tele. Family at the bedside. Purposeful rounding maintained  Problem: Adult Inpatient Plan of Care  Goal: Plan of Care Review  Outcome: Progressing  Goal: Patient-Specific Goal (Individualized)  Outcome: Progressing  Goal: Absence of Hospital-Acquired Illness or Injury  Outcome: Progressing  Intervention: Identify and Manage Fall Risk  Recent Flowsheet Documentation  Taken 04/12/2024 0200 by Joella Musa, RN  Safety Interventions: fall reduction program maintained  Taken 04/12/2024 0000 by Joella Musa, RN  Safety Interventions: fall reduction program maintained  Taken 04/11/2024 2200 by Joella Musa, RN  Safety Interventions: fall reduction program maintained  Taken 04/11/2024 2000 by Joella Musa, RN  Safety Interventions: fall reduction program maintained  Intervention: Prevent Skin Injury  Recent Flowsheet Documentation  Taken 04/12/2024 0200 by Joella Musa, RN  Positioning for Skin: Supine/Back  Taken 04/12/2024 0000 by Joella Musa, RN  Positioning for Skin: Supine/Back  Taken 04/11/2024 2200 by Joella Musa, RN  Positioning for Skin: Supine/Back  Taken 04/11/2024 2000 by Joella Musa, RN  Positioning for Skin: Supine/Back  Goal: Optimal Comfort and Wellbeing  Outcome: Progressing  Goal: Readiness for Transition of Care  Outcome: Progressing  Goal: Rounds/Family Conference  Outcome: Progressing     Problem: Infection  Goal: Absence of Infection Signs and Symptoms  Outcome: Progressing     Problem: Fall Injury Risk  Goal: Absence of Fall and Fall-Related Injury  Outcome: Progressing  Intervention: Promote Injury-Free Environment  Recent Flowsheet Documentation  Taken 04/12/2024 0200 by Joella Musa, RN  Safety Interventions: fall reduction program maintained  Taken 04/12/2024 0000 by Joella Musa, RN  Safety Interventions: fall reduction program maintained  Taken 04/11/2024 2200 by Joella Musa, RN  Safety Interventions: fall reduction program maintained  Taken 04/11/2024 2000 by Joella Musa, RN  Safety Interventions: fall reduction program maintained     Problem: Wound  Goal: Optimal Coping  Outcome: Progressing  Goal: Optimal Functional Ability  Outcome: Progressing  Goal: Absence of Infection Signs and Symptoms  Outcome: Progressing  Goal: Improved Oral Intake  Outcome: Progressing  Goal: Optimal Pain Control and Function  Outcome: Progressing  Goal: Skin Health and Integrity  Outcome: Progressing  Intervention: Optimize Skin Protection  Recent Flowsheet Documentation  Taken 04/12/2024 0200 by Joella Musa, RN  Pressure Reduction Techniques: positioned off wounds  Pressure Reduction Devices: pressure-redistributing mattress utilized  Taken 04/12/2024 0000 by Joella Musa, RN  Pressure Reduction Techniques: positioned off wounds  Pressure Reduction Devices: pressure-redistributing mattress utilized  Taken 04/11/2024 2200 by Joella Musa, RN  Pressure Reduction Techniques: positioned off wounds  Pressure Reduction Devices: pressure-redistributing mattress utilized  Taken 04/11/2024 2000 by Joella Musa, RN  Pressure Reduction Techniques: positioned off wounds  Pressure Reduction Devices: pressure-redistributing mattress utilized  Goal: Optimal Wound Healing  Outcome: Progressing     Problem: Skin Injury Risk Increased  Goal: Skin Health and Integrity  Outcome: Progressing  Intervention: Optimize Skin Protection  Recent Flowsheet Documentation  Taken 04/12/2024 0200 by Joella Musa, RN  Pressure Reduction Techniques: positioned off wounds  Pressure Reduction Devices: pressure-redistributing mattress utilized  Taken 04/12/2024 0000 by Joella Musa, RN  Pressure Reduction Techniques: positioned off wounds  Pressure Reduction  Devices: pressure-redistributing mattress utilized  Taken 04/11/2024 2200 by Joella Musa, RN  Pressure Reduction Techniques: positioned off wounds  Pressure Reduction Devices: pressure-redistributing mattress utilized  Taken 04/11/2024 2000 by Joella Musa, RN  Pressure Reduction Techniques: positioned off wounds  Pressure Reduction Devices: pressure-redistributing mattress utilized     Problem: Self-Care Deficit  Goal: Improved Ability to Complete Activities of Daily Living  Outcome: Progressing     Problem: Malnutrition  Goal: Improved Nutritional Intake  Outcome: Progressing     Problem: Breathing Pattern Ineffective  Goal: Effective Breathing Pattern  Outcome: Progressing

## 2024-04-12 NOTE — Unmapped (Signed)
 Problem: Adult Inpatient Plan of Care  Goal: Plan of Care Review  Outcome: Ongoing - Unchanged  Goal: Patient-Specific Goal (Individualized)  Outcome: Ongoing - Unchanged  Goal: Absence of Hospital-Acquired Illness or Injury  Outcome: Ongoing - Unchanged  Intervention: Identify and Manage Fall Risk  Recent Flowsheet Documentation  Taken 04/12/2024 0830 by Ottie Blonder, RN  Safety Interventions: fall reduction program maintained  Intervention: Prevent Skin Injury  Recent Flowsheet Documentation  Taken 04/12/2024 0835 by Ottie Blonder, RN  Positioning for Skin: Supine/Back  Taken 04/12/2024 0830 by Ottie Blonder, RN  Positioning for Skin: Supine/Back  Intervention: Prevent Infection  Recent Flowsheet Documentation  Taken 04/12/2024 0830 by Ottie Blonder, RN  Infection Prevention: cohorting utilized  Goal: Optimal Comfort and Wellbeing  Outcome: Ongoing - Unchanged  Goal: Readiness for Transition of Care  Outcome: Ongoing - Unchanged  Goal: Rounds/Family Conference  Outcome: Ongoing - Unchanged     Problem: Infection  Goal: Absence of Infection Signs and Symptoms  Outcome: Ongoing - Unchanged  Intervention: Prevent or Manage Infection  Recent Flowsheet Documentation  Taken 04/12/2024 0830 by Ottie Blonder, RN  Infection Management: aseptic technique maintained  Isolation Precautions: contact precautions maintained     Problem: Fall Injury Risk  Goal: Absence of Fall and Fall-Related Injury  Outcome: Ongoing - Unchanged  Intervention: Promote Injury-Free Environment  Recent Flowsheet Documentation  Taken 04/12/2024 0830 by Ottie Blonder, RN  Safety Interventions: fall reduction program maintained     Problem: Wound  Goal: Optimal Coping  Outcome: Ongoing - Unchanged  Goal: Optimal Functional Ability  Outcome: Ongoing - Unchanged  Intervention: Optimize Functional Ability  Recent Flowsheet Documentation  Taken 04/12/2024 0835 by Ottie Blonder, RN  Activity Management: bedrest  Goal: Absence of Infection Signs and Symptoms  Outcome: Ongoing - Unchanged  Intervention: Prevent or Manage Infection  Recent Flowsheet Documentation  Taken 04/12/2024 0830 by Ottie Blonder, RN  Infection Management: aseptic technique maintained  Isolation Precautions: contact precautions maintained  Goal: Improved Oral Intake  Outcome: Ongoing - Unchanged  Goal: Optimal Pain Control and Function  Outcome: Ongoing - Unchanged  Goal: Skin Health and Integrity  Outcome: Ongoing - Unchanged  Intervention: Optimize Skin Protection  Recent Flowsheet Documentation  Taken 04/12/2024 0835 by Ottie Blonder, RN  Activity Management: bedrest  Goal: Optimal Wound Healing  Outcome: Ongoing - Unchanged     Problem: Skin Injury Risk Increased  Goal: Skin Health and Integrity  Outcome: Ongoing - Unchanged  Intervention: Optimize Skin Protection  Recent Flowsheet Documentation  Taken 04/12/2024 0835 by Ottie Blonder, RN  Activity Management: bedrest     Problem: Self-Care Deficit  Goal: Improved Ability to Complete Activities of Daily Living  Outcome: Ongoing - Unchanged     Problem: Malnutrition  Goal: Improved Nutritional Intake  Outcome: Ongoing - Unchanged     Problem: Breathing Pattern Ineffective  Goal: Effective Breathing Pattern  Outcome: Ongoing - Unchanged

## 2024-04-13 LAB — BASIC METABOLIC PANEL
ANION GAP: 11 mmol/L (ref 5–14)
BLOOD UREA NITROGEN: 24 mg/dL — ABNORMAL HIGH (ref 9–23)
BUN / CREAT RATIO: 28
CALCIUM: 9.2 mg/dL (ref 8.7–10.4)
CHLORIDE: 105 mmol/L (ref 98–107)
CO2: 25 mmol/L (ref 20.0–31.0)
CREATININE: 0.85 mg/dL (ref 0.55–1.02)
EGFR CKD-EPI (2021) FEMALE: 69 mL/min/1.73m2 (ref >=60)
GLUCOSE RANDOM: 146 mg/dL (ref 70–179)
POTASSIUM: 4.5 mmol/L (ref 3.4–4.8)
SODIUM: 141 mmol/L (ref 135–145)

## 2024-04-13 LAB — CBC
HEMATOCRIT: 30.3 % — ABNORMAL LOW (ref 34.0–44.0)
HEMOGLOBIN: 9.4 g/dL — ABNORMAL LOW (ref 11.3–14.9)
MEAN CORPUSCULAR HEMOGLOBIN CONC: 31 g/dL — ABNORMAL LOW (ref 32.0–36.0)
MEAN CORPUSCULAR HEMOGLOBIN: 27.5 pg (ref 25.9–32.4)
MEAN CORPUSCULAR VOLUME: 88.8 fL (ref 77.6–95.7)
MEAN PLATELET VOLUME: 8.6 fL (ref 6.8–10.7)
PLATELET COUNT: 295 10*9/L (ref 150–450)
RED BLOOD CELL COUNT: 3.41 10*12/L — ABNORMAL LOW (ref 3.95–5.13)
RED CELL DISTRIBUTION WIDTH: 20.4 % — ABNORMAL HIGH (ref 12.2–15.2)
WBC ADJUSTED: 7.1 10*9/L (ref 3.6–11.2)

## 2024-04-13 LAB — MAGNESIUM: MAGNESIUM: 1.8 mg/dL (ref 1.6–2.6)

## 2024-04-13 LAB — PHOSPHORUS: PHOSPHORUS: 3.1 mg/dL (ref 2.4–5.1)

## 2024-04-13 MED ADMIN — melatonin tablet 4.5 mg: 4.5 mg | ORAL | @ 02:00:00

## 2024-04-13 MED ADMIN — insulin lispro (HumaLOG) injection 0-20 Units: 0-20 [IU] | SUBCUTANEOUS | @ 21:00:00

## 2024-04-13 MED ADMIN — atorvastatin (LIPITOR) tablet 40 mg: 40 mg | ORAL | @ 12:00:00

## 2024-04-13 MED ADMIN — acetaminophen (TYLENOL) tablet 500 mg: 500 mg | ORAL | @ 16:00:00 | Stop: 2024-04-13

## 2024-04-13 MED ADMIN — enoxaparin (LOVENOX) syringe 50 mg: 50 mg | SUBCUTANEOUS | @ 01:00:00

## 2024-04-13 MED ADMIN — acetaminophen (TYLENOL) tablet 500 mg: 500 mg | ORAL | @ 21:00:00 | Stop: 2024-04-13

## 2024-04-13 MED ADMIN — acetaminophen (TYLENOL) tablet 500 mg: 500 mg | ORAL | @ 10:00:00 | Stop: 2024-04-13

## 2024-04-13 MED ADMIN — arformoterol (BROVANA) nebulizer solution 15 mcg/2 mL: 15 ug | RESPIRATORY_TRACT | @ 01:00:00

## 2024-04-13 MED ADMIN — amlodipine (NORVASC) tablet 10 mg: 10 mg | ORAL | @ 12:00:00

## 2024-04-13 MED ADMIN — enoxaparin (LOVENOX) syringe 50 mg: 50 mg | SUBCUTANEOUS | @ 12:00:00

## 2024-04-13 MED ADMIN — ipratropium (ATROVENT) 0.02 % nebulizer solution 500 mcg: 500 ug | RESPIRATORY_TRACT | @ 13:00:00

## 2024-04-13 MED ADMIN — acetaminophen (TYLENOL) tablet 500 mg: 500 mg | ORAL | @ 04:00:00

## 2024-04-13 MED ADMIN — ipratropium (ATROVENT) 0.02 % nebulizer solution 500 mcg: 500 ug | RESPIRATORY_TRACT | @ 20:00:00

## 2024-04-13 MED ADMIN — arformoterol (BROVANA) nebulizer solution 15 mcg/2 mL: 15 ug | RESPIRATORY_TRACT | @ 12:00:00

## 2024-04-13 MED ADMIN — insulin lispro (HumaLOG) injection 0-20 Units: 0-20 [IU] | SUBCUTANEOUS | @ 16:00:00

## 2024-04-13 MED ADMIN — methocarbamol (ROBAXIN) tablet 500 mg: 500 mg | ORAL | @ 01:00:00

## 2024-04-13 NOTE — Unmapped (Signed)
 [facility], ACUTE CARE, and GENERAL SURGERY   - Surgery Daily Progress Note -  04/13/2024     Admit Date: 02/26/2024, Hospital Day: 12  Hospital Service: [facility])  Attending: Magdalena Scholz, MD  [facility] - [facility]  General / Trauma Surgery - Trauma/CC     Assessment     Tammy Braun 83 y.o. female, PMH bladder cancer with cystectomy and ileal conduit, COPD, recent dx of afib, moderate-severe TR, CAD (high calcification score), T2DM presented to [facility] with projectile vomiting and abdominal pain, found to have SBO with transition point at level of ileostomy on CT A/P. OR on 02/28/24 for ex-lap, reduction of parastomal hernia, small bowel resection with primary anastomosis and lysis of adhesions.     Interval Events:  -02/26/24: Admitted to [facility] floor; urology consult for possible ileal conduit revision.   -02/27/24: Patient level of care escalated to step-down d/t increased oxygen needs   -02/28/24: Upgraded to ICU. Intubated at bed-side. OR for ex-lap, reduction of parastomal hernia, small bowel resection with primary anastomosis, lysis of adhesions, abdomen left open with ABThera in place.   -03/01/24: OR for attempt at abdominal closure. Left open due to desaturations during closure attempt. ABThera in place.  -03/03/24: RTOR for abdominal closure  -03/06/24: Vas cath placed, CRRT started   -03/09/24: Extubated to HFNC  -03/10/24: CRRT discontinued   -03/11/24: Re-intubated for hypoxic respiratory failure 2/2 mucus plug   -03/14/24 Extubated to HFNC   -03/15/24: Vas cath removed   -03/16/24: Placed on BiPap for complete collapse of L lung 2/2 probable mucus plugging  -03/19/24: Re-intubated for mucus plugging. Therapeutic bronch, BAL sent.   -03/22/24: Extubated to HFNC  -03/27/24: Transferred to stepdown  -04/03/24: Transferred to floor    Subjective     NAEO. VSS. Asking about being repositioned in bed.    Plan     Neuro:   - PRN APAP    *Insomnia  - Melatonin PRN    *Schizoaffective disorder  - Continue Prolixin  monthly (last dose 4/13)     CV:   *Hx HTN   - HDS  - Home amlodipine     *HFpEF, pulmonary HTN   - Echo (3/12) EF 70%, severe pulmonary HTN     *Hx of HLD  - Continue lipitor  40mg  daily    *Afib-flutter w/ RVR  *Hx of paroxysmal Afib  - Was taking diltiazem  outpatient; previously held due to likely poor absorption and started on amiodarone  in ICU.  - Cardiology consulted, appreciate assistance:    > Can discontinue amiodarone      > Resume DOAC as able    > Page on DC for follow-up - Done.    Resp:   - Not requiring supplemental oxygen. Family occasionally applies oxygen to patient for comfort. Patient maintains saturations >88% without supplemental oxygen.    *Acute on chronic hypoxemia, hx COPD  *Pulmonary edema  *Pulmonary HTN   - Continue pulmonary toilet: OOB and IS   - Continue atrovent  q6h, brovana  BID     *Pulmonary embolism, segmental  - CTA chest (3/4) filling defect in L anterior segmental artery    - Heparin  gtt transitioned to therapeutic lovenox  3/30. Start on Eliquis  at discharge.    FEN/GI:   - Fluids: ML   - Replete lytes PRN  - N: Regular diet with food cut up in small pieces. Cal count in place. Supplements ordered.   - Severe Protein-Calorie Malnutrition in the context of acute illness or injury (03/10/24  0901)  Fat Loss: Moderate  Muscle Loss: Severe  Fluid Accumulation: Moderate  Malnutrition Score: 3  - PRN Zofran   - Frequent BMs, discontinue senna, miralax  to PRN from [facility].    *S/p ex-lap, reduction of parastomal hernia, small bowel resection with primary anastomosis, lysis of adhesions   - WTD dressing changes daily per nursing to midline incision    *Large hiatal hernia, chronic  - re-eval on CT chest/abd/pelvis 3/13 stable    GU/Renal:   - Adequate UOP.   - Cr WNL  - Ileal conduit viable with adequate UOP    *AKI on CKD (resolved)  - Cr stable now  - Diuresis as needed    *H/o bladder cancer s/p radical cystectomy with ileal conduit (2012)  *Parastomal hernia  - Strict I&Os  - Adequate UOP from stoma    Heme  - Hgb stable    ID:   - Afebrile.  - WBC- No leukocytosis.  - C-Diff negative 3/26    Endocrine:   - Continue NPH 10 units BID  - Regular SSI q6hr   - POCT q4h    PPx:   Therapeutic lovenox     Dispo: Floor status  PT: 5XL  OT: 5XL  ST: 5X  Barriers to discharge:  None  CM/SW assisting in discharge planning.   - Medically ready for dispo, CM assisting with SNF placement vs. home    Contact: [facility] Pg. [phone]  Mathias Solon, MD    Objective     Vitals:   Temp:  [36.8 ??C (98.2 ??F)-37 ??C (98.6 ??F)] 36.9 ??C (98.4 ??F)  Heart Rate:  [59-65] 64  Resp:  [18] 18  BP: (144-159)/(65-74) 159/74  MAP (mmHg):  [89-100] 100  SpO2:  [92 %-95 %] 92 %    Intake/Output last 24 hours:  I/O last 3 completed shifts:  In: 240 [P.O.:240]  Out: 1350 [Urine:1350]    Physical Exam:    -General:  Appropriate, comfortable and in no apparent distress.   -Neurological: Awakens to voice. Moves all 4 extremities spontaneously.   -Psych: speech appropriate, pleasant affect  -Cardiovascular: Regular rate and rhythm  -Pulmonary: Normal work of breathing. No accessory muscle use  -Abdomen: Soft, non-distended. Appropriately tender to palpation post- operatively. Midline incision with dressing place.  No purulence.  -Genitourinary: Ileal conduit urostomy pink and moist with good UOP.  -Extremities: Warm, well perfused, normal skin turgor.     Data Review:    I have reviewed the labs and studies from the last 24 hours.    Imaging: Radiology studies were personally reviewed

## 2024-04-13 NOTE — Unmapped (Signed)
 Pt A/O x4, forgetful. VSS. Regular diet and tolerating well. Denies need for PRN medications.     Pt had BM.     Urostomy in place and assessed per protocol.     Sacrum crusted, per order.     All belongings with patient. Bed is low and locked. Purposeful hourly monitoring maintained.

## 2024-04-13 NOTE — Unmapped (Signed)
 Pt A and O x4, VSS. On a regular diet and tolerating well. Urostomy in place with adequate output. Dressing to ML abdomen changed per order. Denies need for PRN medications. All belonging within pts reach. Bed is low and locked. Purposeful hourly rounding maintained.       Problem: Adult Inpatient Plan of Care  Goal: Plan of Care Review  Outcome: Progressing  Goal: Patient-Specific Goal (Individualized)  Outcome: Progressing  Goal: Absence of Hospital-Acquired Illness or Injury  Outcome: Progressing  Intervention: Identify and Manage Fall Risk  Recent Flowsheet Documentation  Taken 04/13/2024 1400 by Tammie Fall, RN  Safety Interventions:   fall reduction program maintained   low bed   lighting adjusted for tasks/safety   nonskid shoes/slippers when out of bed  Taken 04/13/2024 1200 by Tammie Fall, RN  Safety Interventions:   fall reduction program maintained   low bed   lighting adjusted for tasks/safety   nonskid shoes/slippers when out of bed  Taken 04/13/2024 1000 by Tammie Fall, RN  Safety Interventions:   fall reduction program maintained   lighting adjusted for tasks/safety   low bed   nonskid shoes/slippers when out of bed  Taken 04/13/2024 0800 by Tammie Fall, RN  Safety Interventions:   fall reduction program maintained   lighting adjusted for tasks/safety   low bed   nonskid shoes/slippers when out of bed  Intervention: Prevent Skin Injury  Recent Flowsheet Documentation  Taken 04/13/2024 1400 by Tammie Fall, RN  Positioning for Skin: Supine/Back  Taken 04/13/2024 1200 by Tammie Fall, RN  Positioning for Skin: Supine/Back  Taken 04/13/2024 1000 by Tammie Fall, RN  Positioning for Skin: Supine/Back  Taken 04/13/2024 0800 by Tammie Fall, RN  Positioning for Skin: Supine/Back  Intervention: Prevent and Manage VTE (Venous Thromboembolism) Risk  Recent Flowsheet Documentation  Taken 04/13/2024 1400 by Tammie Fall, RN  Anti-Embolism Device Status: Refused  Taken 04/13/2024 1200 by Tammie Fall, RN  Anti-Embolism Device Status: Refused  Taken 04/13/2024 1000 by Tammie Fall, RN  Anti-Embolism Device Status: Refused  Taken 04/13/2024 0800 by Tammie Fall, RN  Anti-Embolism Device Status: Refused  Goal: Optimal Comfort and Wellbeing  Outcome: Progressing  Goal: Readiness for Transition of Care  Outcome: Progressing  Goal: Rounds/Family Conference  Outcome: Progressing     Problem: Infection  Goal: Absence of Infection Signs and Symptoms  Outcome: Progressing     Problem: Fall Injury Risk  Goal: Absence of Fall and Fall-Related Injury  Outcome: Progressing  Intervention: Promote Injury-Free Environment  Recent Flowsheet Documentation  Taken 04/13/2024 1400 by Tammie Fall, RN  Safety Interventions:   fall reduction program maintained   low bed   lighting adjusted for tasks/safety   nonskid shoes/slippers when out of bed  Taken 04/13/2024 1200 by Tammie Fall, RN  Safety Interventions:   fall reduction program maintained   low bed   lighting adjusted for tasks/safety   nonskid shoes/slippers when out of bed  Taken 04/13/2024 1000 by Tammie Fall, RN  Safety Interventions:   fall reduction program maintained   lighting adjusted for tasks/safety   low bed   nonskid shoes/slippers when out of bed  Taken 04/13/2024 0800 by Tammie Fall, RN  Safety Interventions:   fall reduction program maintained   lighting adjusted for tasks/safety   low bed   nonskid shoes/slippers when out of bed     Problem: Wound  Goal: Optimal Coping  Outcome: Progressing  Goal: Optimal Functional Ability  Outcome: Progressing  Goal: Absence of Infection Signs  and Symptoms  Outcome: Progressing  Goal: Improved Oral Intake  Outcome: Progressing  Goal: Optimal Pain Control and Function  Outcome: Progressing  Goal: Skin Health and Integrity  Outcome: Progressing  Intervention: Optimize Skin Protection  Recent Flowsheet Documentation  Taken 04/13/2024 1400 by Tammie Fall, RN  Pressure Reduction Techniques: frequent weight shift encouraged  Taken 04/13/2024 1200 by Tammie Fall, RN  Pressure Reduction Techniques: frequent weight shift encouraged  Taken 04/13/2024 1000 by Tammie Fall, RN  Pressure Reduction Techniques: frequent weight shift encouraged  Taken 04/13/2024 0800 by Tammie Fall, RN  Pressure Reduction Techniques: frequent weight shift encouraged  Goal: Optimal Wound Healing  Outcome: Progressing     Problem: Skin Injury Risk Increased  Goal: Skin Health and Integrity  Outcome: Progressing  Intervention: Optimize Skin Protection  Recent Flowsheet Documentation  Taken 04/13/2024 1400 by Tammie Fall, RN  Pressure Reduction Techniques: frequent weight shift encouraged  Taken 04/13/2024 1200 by Tammie Fall, RN  Pressure Reduction Techniques: frequent weight shift encouraged  Taken 04/13/2024 1000 by Tammie Fall, RN  Pressure Reduction Techniques: frequent weight shift encouraged  Taken 04/13/2024 0800 by Tammie Fall, RN  Pressure Reduction Techniques: frequent weight shift encouraged     Problem: Self-Care Deficit  Goal: Improved Ability to Complete Activities of Daily Living  Outcome: Progressing     Problem: Malnutrition  Goal: Improved Nutritional Intake  Outcome: Progressing     Problem: Breathing Pattern Ineffective  Goal: Effective Breathing Pattern  Outcome: Progressing

## 2024-04-14 MED ADMIN — enoxaparin (LOVENOX) syringe 50 mg: 50 mg | SUBCUTANEOUS | @ 13:00:00 | Stop: 2024-04-14

## 2024-04-14 MED ADMIN — insulin lispro (HumaLOG) injection 0-20 Units: 0-20 [IU] | SUBCUTANEOUS | @ 11:00:00 | Stop: 2024-04-14

## 2024-04-14 MED ADMIN — methocarbamol (ROBAXIN) tablet 500 mg: 500 mg | ORAL | @ 01:00:00

## 2024-04-14 MED ADMIN — insulin NPH (HumuLIN,NovoLIN) injection 10 Units: 10 [IU] | SUBCUTANEOUS | @ 11:00:00 | Stop: 2024-04-14

## 2024-04-14 MED ADMIN — insulin lispro (HumaLOG) injection 0-20 Units: 0-20 [IU] | SUBCUTANEOUS | @ 03:00:00

## 2024-04-14 MED ADMIN — ipratropium (ATROVENT) 0.02 % nebulizer solution 500 mcg: 500 ug | RESPIRATORY_TRACT | @ 01:00:00

## 2024-04-14 MED ADMIN — acetaminophen (TYLENOL) tablet 1,000 mg: 1000 mg | ORAL | @ 21:00:00 | Stop: 2024-04-14

## 2024-04-14 MED ADMIN — ipratropium (ATROVENT) 0.02 % nebulizer solution 500 mcg: 500 ug | RESPIRATORY_TRACT | @ 14:00:00 | Stop: 2024-04-14

## 2024-04-14 MED ADMIN — atorvastatin (LIPITOR) tablet 40 mg: 40 mg | ORAL | @ 13:00:00 | Stop: 2024-04-14

## 2024-04-14 MED ADMIN — ipratropium (ATROVENT) 0.02 % nebulizer solution 500 mcg: 500 ug | RESPIRATORY_TRACT | @ 21:00:00 | Stop: 2024-04-14

## 2024-04-14 MED ADMIN — arformoterol (BROVANA) nebulizer solution 15 mcg/2 mL: 15 ug | RESPIRATORY_TRACT | @ 13:00:00 | Stop: 2024-04-14

## 2024-04-14 MED ADMIN — amlodipine (NORVASC) tablet 10 mg: 10 mg | ORAL | @ 13:00:00 | Stop: 2024-04-14

## 2024-04-14 MED ADMIN — melatonin tablet 4.5 mg: 4.5 mg | ORAL | @ 01:00:00

## 2024-04-14 MED ADMIN — acetaminophen (TYLENOL) tablet 1,000 mg: 1000 mg | ORAL | @ 14:00:00 | Stop: 2024-04-14

## 2024-04-14 MED ADMIN — enoxaparin (LOVENOX) syringe 50 mg: 50 mg | SUBCUTANEOUS | @ 02:00:00

## 2024-04-14 MED ADMIN — acetaminophen (TYLENOL) tablet 1,000 mg: 1000 mg | ORAL | @ 02:00:00 | Stop: 2024-04-13

## 2024-04-14 MED ADMIN — cholecalciferol (vitamin D3 25 mcg (1,000 units)) tablet 50 mcg: 50 ug | ORAL | @ 18:00:00 | Stop: 2024-04-14

## 2024-04-14 MED ADMIN — arformoterol (BROVANA) nebulizer solution 15 mcg/2 mL: 15 ug | RESPIRATORY_TRACT | @ 01:00:00

## 2024-04-14 NOTE — Unmapped (Signed)
 [place] TRAUMA, ACUTE CARE, and GENERAL SURGERY   - Surgery Daily Progress Note -  04/14/2024     Admit Date: 02/26/2024, Hospital Day: 91  Hospital Service: [facility])  Attending: Magdalena Scholz, MD  [facility] - [facility]-4  General / Trauma Surgery - Trauma/CC     Assessment     Tammy Braun 83 y.o. female, PMH bladder cancer with cystectomy and ileal conduit, COPD, recent dx of afib, moderate-severe TR, CAD (high calcification score), T2DM presented to [facility] with projectile vomiting and abdominal pain, found to have SBO with transition point at level of ileostomy on CT A/P. OR on 02/28/24 for ex-lap, reduction of parastomal hernia, small bowel resection with primary anastomosis and lysis of adhesions.     Interval Events:  -02/26/24: Admitted to [facility] floor; urology consult for possible ileal conduit revision.   -02/27/24: Patient level of care escalated to step-down d/t increased oxygen needs   -02/28/24: Upgraded to ICU. Intubated at bed-side. OR for ex-lap, reduction of parastomal hernia, small bowel resection with primary anastomosis, lysis of adhesions, abdomen left open with ABThera in place.   -03/01/24: OR for attempt at abdominal closure. Left open due to desaturations during closure attempt. ABThera in place.  -03/03/24: RTOR for abdominal closure  -03/06/24: Vas cath placed, CRRT started   -03/09/24: Extubated to HFNC  -03/10/24: CRRT discontinued   -03/11/24: Re-intubated for hypoxic respiratory failure 2/2 mucus plug   -03/14/24 Extubated to HFNC   -03/15/24: Vas cath removed   -03/16/24: Placed on BiPap for complete collapse of L lung 2/2 probable mucus plugging  -03/19/24: Re-intubated for mucus plugging. Therapeutic bronch, BAL sent.   -03/22/24: Extubated to HFNC  -03/27/24: Transferred to stepdown  -04/03/24: Transferred to floor    Subjective     No acute events overnight.     Plan     Neuro:   - PRN APAP    *Insomnia  - Melatonin PRN    *Schizoaffective disorder  - Continue Prolixin  monthly (last dose 4/13)     CV: *Hx HTN   - HDS  - Home amlodipine     *HFpEF, pulmonary HTN   - Echo (3/12) EF 70%, severe pulmonary HTN     *Hx of HLD  - Continue lipitor  40mg  daily    *Afib-flutter w/ RVR  *Hx of paroxysmal Afib  - Was taking diltiazem  outpatient; previously held due to likely poor absorption and started on amiodarone  in ICU  - Cardiology consulted, appreciate assistance:    > Can discontinue amiodarone      > Resume DOAC as able    > Page on DC for follow-up -done     Resp:   - Not requiring supplemental oxygen. Family occasionally applies oxygen to patient for comfort. Patient maintains saturations >88% without supplemental oxygen.    *Acute on chronic hypoxemia, hx COPD  *Pulmonary edema  *Pulmonary HTN   - Continue pulmonary toilet: OOB and IS   - Continue atrovent  q6h, brovana  BID     *Pulmonary embolism, segmental  - CTA chest (3/4) filling defect in L anterior segmental artery    - Heparin  gtt transitioned to therapeutic lovenox  3/30. Start on Eliquis  at discharge.    FEN/GI:   - Fluids: ML   - Replete lytes PRN  - N: Regular diet with food cut up in small pieces. Cal count in place. Supplements ordered.   - Severe Protein-Calorie Malnutrition in the context of acute illness or injury (03/10/24 0901)  Fat Loss: Moderate  Muscle Loss: Severe  Fluid Accumulation: Moderate  Malnutrition Score: 3  - PRN Zofran   - Frequent BMs, discontinue senna, miralax  to PRN from [facility].    *S/p ex-lap, reduction of parastomal hernia, small bowel resection with primary anastomosis, lysis of adhesions   - WTD dressing changes daily per nursing to midline incision    *Large hiatal hernia, chronic  - re-eval on CT chest/abd/pelvis 3/13 stable    GU/Renal:   - Adequate UOP.   - Cr WNL  - Ileal conduit viable with adequate UOP    *AKI on CKD (resolved)  - Cr stable  - Diuresis as needed    *H/o bladder cancer s/p radical cystectomy with ileal conduit (2012)  *Parastomal hernia  - Strict I&Os  - Adequate UOP from stoma    Heme  - Hgb stable    ID: - Afebrile.  - WBC- No leukocytosis.  - C-Diff negative 3/26    Endocrine:   - Continue NPH 10 units BID  - Regular SSI q6hr   - POCT q4h    PPx:   Therapeutic lovenox     Dispo: Floor status  PT: 5XL  OT: 5XL  ST: 5X  Barriers to discharge:  None  CM/SW assisting in discharge planning.   - Medically ready for dispo, CM assisting with SNF placement vs. home    Contact: SRH-4 Pg. [phone]      Objective     Vitals:   Temp:  [36.7 ??C (98.1 ??F)-36.9 ??C (98.4 ??F)] 36.7 ??C (98.1 ??F)  Heart Rate:  [58-64] 58  Resp:  [18] 18  BP: (135-159)/(61-89) 151/89  MAP (mmHg):  [80-107] 107  SpO2:  [92 %-97 %] 97 %    Intake/Output last 24 hours:  I/O last 3 completed shifts:  In: 480 [P.O.:480]  Out: 1750 [Urine:1750]    Physical Exam:    -General:  Appropriate, comfortable and in no apparent distress.   -Neurological: Awakens to voice. Moves all 4 extremities spontaneously.   -Psych: speech appropriate, pleasant affect  -Cardiovascular: Regular rate   -Pulmonary: Normal work of breathing. No accessory muscle use, wet cough noted   -Abdomen: Soft, non-distended. Appropriately tender to palpation post- operatively. Midline incision with dressing place.  No purulence.  -Genitourinary: Ileal conduit urostomy pink and moist with good UOP.  -Extremities: Warm, well perfused, normal skin turgor.     Data Review:    I have reviewed the labs and studies from the last 24 hours.    Imaging: None

## 2024-04-14 NOTE — Unmapped (Signed)
 Pt is AxOx4. VSS on RA. Tolerating diet. Urostomy in place. Pain managed with scheduled medication with good effect. Dressing remains c/d/I. Crusted sacrum wound per order. ACHS covered with require insulin . All safety precautions in place. Contact precautions maintained. Family at bedside.   Problem: Adult Inpatient Plan of Care  Goal: Plan of Care Review  Outcome: Progressing  Goal: Patient-Specific Goal (Individualized)  Outcome: Progressing  Goal: Absence of Hospital-Acquired Illness or Injury  Outcome: Progressing  Intervention: Identify and Manage Fall Risk  Recent Flowsheet Documentation  Taken 04/14/2024 0000 by Salvadore Creek, RN  Safety Interventions: low bed  Taken 04/13/2024 2200 by Salvadore Creek, RN  Safety Interventions: low bed  Taken 04/13/2024 2000 by Salvadore Creek, RN  Safety Interventions: low bed  Intervention: Prevent Skin Injury  Recent Flowsheet Documentation  Taken 04/14/2024 0000 by Salvadore Creek, RN  Positioning for Skin: Supine/Back  Device Skin Pressure Protection: absorbent pad utilized/changed  Taken 04/13/2024 2200 by Salvadore Creek, RN  Positioning for Skin: Supine/Back  Device Skin Pressure Protection: absorbent pad utilized/changed  Taken 04/13/2024 2000 by Salvadore Creek, RN  Positioning for Skin: Supine/Back  Device Skin Pressure Protection: absorbent pad utilized/changed  Intervention: Prevent and Manage VTE (Venous Thromboembolism) Risk  Recent Flowsheet Documentation  Taken 04/14/2024 0000 by Salvadore Creek, RN  Anti-Embolism Device Type: SCD, Knee  Anti-Embolism Device Status: Refused  Anti-Embolism Device Location: BLE  Taken 04/13/2024 2200 by Salvadore Creek, RN  Anti-Embolism Device Type: SCD, Knee  Anti-Embolism Device Status: Refused  Anti-Embolism Device Location: BLE  Taken 04/13/2024 2111 by Salvadore Creek, RN  Anti-Embolism Device Type: SCD, Knee  Anti-Embolism Device Status: Refused  Anti-Embolism Device Location: BLE  Taken 04/13/2024 2000 by Salvadore Creek, RN  Anti-Embolism Device Type: SCD, Knee  Anti-Embolism Device Status: Refused  Anti-Embolism Device Location: BLE  Intervention: Prevent Infection  Recent Flowsheet Documentation  Taken 04/14/2024 0000 by Salvadore Creek, RN  Infection Prevention: cohorting utilized  Taken 04/13/2024 2200 by Salvadore Creek, RN  Infection Prevention: cohorting utilized  Taken 04/13/2024 2000 by Salvadore Creek, RN  Infection Prevention: cohorting utilized  Goal: Optimal Comfort and Wellbeing  Outcome: Progressing  Goal: Readiness for Transition of Care  Outcome: Progressing  Goal: Rounds/Family Conference  Outcome: Progressing     Problem: Fall Injury Risk  Goal: Absence of Fall and Fall-Related Injury  Outcome: Progressing  Intervention: Promote Injury-Free Environment  Recent Flowsheet Documentation  Taken 04/14/2024 0000 by Salvadore Creek, RN  Safety Interventions: low bed  Taken 04/13/2024 2200 by Salvadore Creek, RN  Safety Interventions: low bed  Taken 04/13/2024 2000 by Salvadore Creek, RN  Safety Interventions: low bed     Problem: Wound  Goal: Optimal Coping  Outcome: Progressing  Goal: Optimal Functional Ability  Outcome: Progressing  Goal: Absence of Infection Signs and Symptoms  Outcome: Progressing  Intervention: Prevent or Manage Infection  Recent Flowsheet Documentation  Taken 04/14/2024 0000 by Salvadore Creek, RN  Infection Management: aseptic technique maintained  Taken 04/13/2024 2200 by Salvadore Creek, RN  Infection Management: aseptic technique maintained  Taken 04/13/2024 2000 by Salvadore Creek, RN  Infection Management: aseptic technique maintained  Goal: Improved Oral Intake  Outcome: Progressing  Goal: Optimal Pain Control and Function  Outcome: Progressing  Goal: Skin Health and Integrity  Outcome: Progressing  Intervention: Optimize Skin Protection  Recent Flowsheet Documentation  Taken 04/14/2024 0000 by Salvadore Creek, RN  Pressure  Reduction Techniques: frequent weight shift encouraged  Taken 04/13/2024 2200 by Salvadore Creek, RN  Pressure Reduction Techniques: frequent weight shift encouraged  Pressure Reduction Devices: pressure-redistributing mattress utilized  Taken 04/13/2024 2000 by Salvadore Creek, RN  Pressure Reduction Techniques: frequent weight shift encouraged  Goal: Optimal Wound Healing  Outcome: Progressing     Problem: Skin Injury Risk Increased  Goal: Skin Health and Integrity  Outcome: Progressing  Intervention: Optimize Skin Protection  Recent Flowsheet Documentation  Taken 04/14/2024 0000 by Salvadore Creek, RN  Pressure Reduction Techniques: frequent weight shift encouraged  Taken 04/13/2024 2200 by Salvadore Creek, RN  Pressure Reduction Techniques: frequent weight shift encouraged  Pressure Reduction Devices: pressure-redistributing mattress utilized  Taken 04/13/2024 2000 by Salvadore Creek, RN  Pressure Reduction Techniques: frequent weight shift encouraged

## 2024-04-14 NOTE — Unmapped (Signed)
 Patient is A and O x4 VSS, educated patient and family on follow up appointment, incision care, when to call provider, activity restrictions, and new prescriptions. Per teach back patient has full understanding. Printed AVS given to patient. All belonging sent home with patient. Patient discharged home via BLS.    Problem: Adult Inpatient Plan of Care  Goal: Plan of Care Review  04/14/2024 1713 by Raenette Bumps, RN  Outcome: Discharged to Home  04/14/2024 1713 by Raenette Bumps, RN  Outcome: Progressing  Goal: Patient-Specific Goal (Individualized)  04/14/2024 1713 by Raenette Bumps, RN  Outcome: Discharged to Home  04/14/2024 1713 by Raenette Bumps, RN  Outcome: Progressing  Goal: Absence of Hospital-Acquired Illness or Injury  04/14/2024 1713 by Raenette Bumps, RN  Outcome: Discharged to Home  04/14/2024 1713 by Raenette Bumps, RN  Outcome: Progressing  Intervention: Identify and Manage Fall Risk  Recent Flowsheet Documentation  Taken 04/14/2024 1600 by Raenette Bumps, RN  Safety Interventions:   fall reduction program maintained   low bed  Taken 04/14/2024 1400 by Raenette Bumps, RN  Safety Interventions:   fall reduction program maintained   low bed  Taken 04/14/2024 1200 by Raenette Bumps, RN  Safety Interventions:   fall reduction program maintained   low bed  Taken 04/14/2024 1000 by Raenette Bumps, RN  Safety Interventions:   fall reduction program maintained   low bed  Taken 04/14/2024 0800 by Raenette Bumps, RN  Safety Interventions:   fall reduction program maintained   low bed  Intervention: Prevent Skin Injury  Recent Flowsheet Documentation  Taken 04/14/2024 1600 by Raenette Bumps, RN  Positioning for Skin: Supine/Back  Taken 04/14/2024 1400 by Raenette Bumps, RN  Positioning for Skin: Left  Taken 04/14/2024 1200 by Raenette Bumps, RN  Positioning for Skin: Left  Taken 04/14/2024 1000 by Raenette Bumps, RN  Positioning for Skin: Right  Taken 04/14/2024 0800 by Raenette Bumps, RN  Positioning for Skin: Right  Intervention: Prevent and Manage VTE (Venous Thromboembolism) Risk  Recent Flowsheet Documentation  Taken 04/14/2024 1600 by Raenette Bumps, RN  Anti-Embolism Device Status: Refused  Taken 04/14/2024 1400 by Raenette Bumps, RN  Anti-Embolism Device Status: Refused  Taken 04/14/2024 1200 by Raenette Bumps, RN  Anti-Embolism Device Status: Refused  Taken 04/14/2024 1000 by Raenette Bumps, RN  Anti-Embolism Device Status: Refused  Taken 04/14/2024 0800 by Raenette Bumps, RN  Anti-Embolism Device Status: Refused  Goal: Optimal Comfort and Wellbeing  04/14/2024 1713 by Raenette Bumps, RN  Outcome: Discharged to Home  04/14/2024 1713 by Raenette Bumps, RN  Outcome: Progressing  Goal: Readiness for Transition of Care  04/14/2024 1713 by Raenette Bumps, RN  Outcome: Discharged to Home  04/14/2024 1713 by Raenette Bumps, RN  Outcome: Progressing  Goal: Rounds/Family Conference  04/14/2024 1713 by Raenette Bumps, RN  Outcome: Discharged to Home  04/14/2024 1713 by Raenette Bumps, RN  Outcome: Progressing     Problem: Infection  Goal: Absence of Infection Signs and Symptoms  04/14/2024 1713 by Raenette Bumps, RN  Outcome: Discharged to Home  04/14/2024 1713 by Raenette Bumps, RN  Outcome: Progressing     Problem: Fall Injury Risk  Goal: Absence of Fall and Fall-Related Injury  04/14/2024 1713 by Raenette Bumps, RN  Outcome: Discharged to Home  04/14/2024 1713 by Raenette Bumps, RN  Outcome: Progressing  Intervention: Promote Scientist, clinical (histocompatibility and immunogenetics) Documentation  Taken 04/14/2024 1600 by Raenette Bumps, RN  Safety Interventions:   fall reduction program maintained   low bed  Taken 04/14/2024 1400 by Raenette Bumps, RN  Safety Interventions:   fall reduction program maintained   low bed  Taken 04/14/2024 1200 by Raenette Bumps, RN  Safety Interventions:   fall reduction program maintained   low bed  Taken 04/14/2024 1000 by Raenette Bumps, RN  Safety Interventions:   fall reduction program maintained   low bed  Taken 04/14/2024 0800 by Raenette Bumps, RN  Safety Interventions:   fall reduction program maintained   low bed     Problem: Wound  Goal: Optimal Coping  04/14/2024 1713 by Raenette Bumps, RN  Outcome: Discharged to Home  04/14/2024 1713 by Raenette Bumps, RN  Outcome: Progressing  Goal: Optimal Functional Ability  04/14/2024 1713 by Raenette Bumps, RN  Outcome: Discharged to Home  04/14/2024 1713 by Raenette Bumps, RN  Outcome: Progressing  Goal: Absence of Infection Signs and Symptoms  04/14/2024 1713 by Raenette Bumps, RN  Outcome: Discharged to Home  04/14/2024 1713 by Raenette Bumps, RN  Outcome: Progressing  Goal: Improved Oral Intake  04/14/2024 1713 by Raenette Bumps, RN  Outcome: Discharged to Home  04/14/2024 1713 by Raenette Bumps, RN  Outcome: Progressing  Goal: Optimal Pain Control and Function  04/14/2024 1713 by Raenette Bumps, RN  Outcome: Discharged to Home  04/14/2024 1713 by Raenette Bumps, RN  Outcome: Progressing  Goal: Skin Health and Integrity  04/14/2024 1713 by Raenette Bumps, RN  Outcome: Discharged to Home  04/14/2024 1713 by Raenette Bumps, RN  Outcome: Progressing  Intervention: Optimize Skin Protection  Recent Flowsheet Documentation  Taken 04/14/2024 1600 by Raenette Bumps, RN  Pressure Reduction Techniques: frequent weight shift encouraged  Taken 04/14/2024 1400 by Raenette Bumps, RN  Pressure Reduction Techniques: frequent weight shift encouraged  Taken 04/14/2024 1200 by Raenette Bumps, RN  Pressure Reduction Techniques: frequent weight shift encouraged  Taken 04/14/2024 1000 by Raenette Bumps, RN  Pressure Reduction Techniques: frequent weight shift encouraged  Taken 04/14/2024 0800 by Raenette Bumps, RN  Pressure Reduction Techniques: frequent weight shift encouraged  Goal: Optimal Wound Healing  04/14/2024 1713 by Raenette Bumps, RN  Outcome: Discharged to Home  04/14/2024 1713 by Raenette Bumps, RN  Outcome: Progressing     Problem: Skin Injury Risk Increased  Goal: Skin Health and Integrity  04/14/2024 1713 by Raenette Bumps, RN  Outcome: Discharged to Home  04/14/2024 1713 by Raenette Bumps, RN  Outcome: Progressing  Intervention: Optimize Skin Protection  Recent Flowsheet Documentation  Taken 04/14/2024 1600 by Raenette Bumps, RN  Pressure Reduction Techniques: frequent weight shift encouraged  Taken 04/14/2024 1400 by Raenette Bumps, RN  Pressure Reduction Techniques: frequent weight shift encouraged  Taken 04/14/2024 1200 by Raenette Bumps, RN  Pressure Reduction Techniques: frequent weight shift encouraged  Taken 04/14/2024 1000 by Raenette Bumps, RN  Pressure Reduction Techniques: frequent weight shift encouraged  Taken 04/14/2024 0800 by Raenette Bumps, RN  Pressure Reduction Techniques: frequent weight shift encouraged     Problem: Self-Care Deficit  Goal: Improved Ability to Complete Activities of Daily Living  04/14/2024 1713 by Raenette Bumps, RN  Outcome: Discharged to Home  04/14/2024 1713 by Raenette Bumps, RN  Outcome: Progressing  Problem: Malnutrition  Goal: Improved Nutritional Intake  04/14/2024 1713 by Raenette Bumps, RN  Outcome: Discharged to Home  04/14/2024 1713 by Raenette Bumps, RN  Outcome: Progressing     Problem: Breathing Pattern Ineffective  Goal: Effective Breathing Pattern  04/14/2024 1713 by Raenette Bumps, RN  Outcome: Discharged to Home  04/14/2024 1713 by Raenette Bumps, RN  Outcome: Progressing

## 2024-04-17 DIAGNOSIS — I639 Cerebral infarction, unspecified: Principal | ICD-10-CM

## 2024-04-17 DIAGNOSIS — E1149 Type 2 diabetes mellitus with other diabetic neurological complication: Principal | ICD-10-CM

## 2024-04-17 NOTE — Unmapped (Cosign Needed Addendum)
 [facility] [place] POPULATION HEALTH  Care Management Progress Note    Date: 04/17/2024  Outcome:  Phone outreach completed    Purpose of contact:           CM called patient's daughter for post hospitalization follow up.     Services in place:  Home Health: [facility], RN coming Monday  DME: [employer] lift, wheelchair  Personal Care Services: still in place, aide coming in Saturday (resuming PCS services)  PCP visit: not scheduled    States mom has to be transported via ambulance, states mom can't hold her core up. Left hospital to home via ambulance as well    DME:  States needs orders for [facility]: bed (patient was discharged with thicker padsultra-absorbed advanced 300 by [facility]) and the patient can be turned more easily and pads are much sturdier. Was told an office visit is needed for new order. CM able to locate in chart from 4/11.  Wound care supplies, needs to patch it. Only has two left - abdominal pad by cardinal. May need new orders as well but wound care RN may be able to provide.   Home health - [facility], nurse coming on Monday. Also will get PT, OT. Wound care included in the order.     Daughter states the patient has some bed sores, and something wrong with corner finger on her right hand she would like looked at.     Patient also getting an electric hoyer lift as the one she has is difficult to use and too high. Spoke with [employer] and said the new one can come tomorrow.     States she thinks patient may be swelling in her feet and buttocks and she is a little concerned about blood pressure as well. Wants to be seen in-clinic but patient in need of stretcher transport as she is unable to walk/sit-up and hoyer lift is used for moving patient.     Discussed with patient's daughter that CM will research options and get back to patient with more information:    Contacted [facility] at [phone], patient required to self-pay in full, then will submit for reimbursement   [facility] - does not bill insurance outpatient  [facility] - will transport patients outpatient with medical necessity, will bill Medicare A/B and Medicaid.  ------    Contacted patient to discuss options. Transitions clinic scheduled for Monday 4/21. Discussed stretcher transport options and patient's daughter will call [facility] for booking inquiry. CM to insure paperwork is ready for patient's incontinence supplies as patient's daughter expressed this is a priority.     CM spoke with [facility] staff member to inquire about physician signature and confirm what is needed. Staff member states that physician in clinic can sign if not PCP, just cross out name and make sure to include signing physician's NPI. Inquired regarding the patient's specific bed pads, was transferred to product specialist and left a VM.     Additional Information/Plan:  Patient provided my direct contact information and encouraged to contact me should additional needs arise.    Time Spent Per Day:  Chart review was completed prior to outreach attempt.   04/17/2024: 45      Paulita Boss, MSW  [facility] [place]

## 2024-04-19 NOTE — Unmapped (Unsigned)
 Transitions of Care Note    Subjective      Admission Date: 02/26/24  Discharge Date: 04/14/24  Discharge Hospital/Unit: 5 BT [facility]  The patient was discharged from [facility] and sent to her home.    Post discharge interactive communication via telephone was attempted with patient on 4/16 and 4/17 and I have reviewed the information from that communication.    Today's (04/19/2024) face to face interactive visit is within 7 days days of discharge.    Chief Complaint: Patient is here in follow-up to their recent hospitalization and to discuss the following medical problems:     # Small bowel obstruction    # Hx COPD with AHRF and intubation during hospitalization    # Dx Dementia    # history of bladder cancer status post radical cystectomy - Hx ileostomy    HISTORY OF PRESENT ILLNESS:    Tammy Braun is a 83 y.o. female who presents for transitions hospital follow up.    Hospitalized for: SBO, acute hypoxic respiratory failure    Tammy Braun is a 83 y.o. female with PMH bladder cancer with cystectomy and ileal conduit, COPD, recent dx of afib, moderate-severe TR, CAD (high calcification score), T2DM [facility] with projectile vomiting and abdominal pain, found to have SBO with transition point at level of ileostomy on CT A/P. On initial presentation, labs remarkable for leukocytosis thought to be iso hemoconcentration, AKI on CKD, mildly elevated venous lactate improved with resuscitation. Admitted to [facility] for worsening hypoxia on HFNC requiring intubation in setting of hiatal hernia. OR on 02/28/24 for ex-lap, reduciton of parastomal hernia, small bowel resection with primary anastomosis and lysis of adhesions.     Interval Events:  -02/26/24: Admitted to [facility] floor; urology consult for possible ileal conduit revision.   -02/27/24: Patient level of care escalated to step-down d/t increased oxygen needs   -02/28/24: Upgraded to ICU. Intubated at bed-side. OR for ex-lap, reduction of parastomal hernia, small bowel resection with primary anastomosis, lysis of adhesions, abdomen left open with ABThera in place.   -03/01/24: OR for attempt at abdominal closure. Left open due to desaturations during closure attempt. ABThera in place.  -03/03/24: RTOR for abdominal closure  -03/06/24: Vas cath placed, CRRT started   -03/09/24: Extubated to HFNC  -03/10/24: CRRT discontinued   -03/11/24: Re-intubated for hypoxic respiratory failure 2/2 mucus plug   -03/14/24 Extubated to HFNC   -03/15/24: Vas cath removed   -03/16/24: Placed on BiPap for complete collapse of L lung 2/2 probable mucus plugging  -03/19/24: Re-intubated for mucus plugging. Therapeutic bronch, BAL sent.   -03/22/24: Extubated to HFNC  -03/27/24: Transferred to stepdown  -04/03/24: Transferred to floor    Interval update: ***    Pt reported factors contributing to admission or ED visit: { :42772}    Patient uses pill box { :42817}    PHQ-9 PHQ-9 Total Score   01/31/2024   9:30 AM 5   04/19/2023  10:00 AM 12   11/02/2022  11:00 AM 10   07/27/2022  10:00 AM 10   04/13/2022  11:00 AM 0   09/23/2020  10:00 AM 4   10/18/2017  10:00 AM 10       The remaining 10 systems reviewed were negative.      {Seen by Pharmacist and/or Social Worker:42773}    I have reviewed the patients discharge summary for this hospitalization.  I have also reviewed the problem list, allergies, family and social  history and updated them as needed.         Objective     Ms. Sisney  vitals were not taken for this visit.     GEN: well appearing, NAD ***   HEENT: NCAT, No scleral icterus. Conjunctiva non-erythematous. MMM.   CV: Regular rate and rhythm. No murmurs/rubs/gallops.   Pulm: Normal work of breathing on ***. CTAB. No wheezing, crackles, or rhonchi.   Abd: Flat.  Nontender. No guarding, rebound.  Normoactive bowel sounds.     Neuro: A&O x 3. No focal deficits.   Ext: No peripheral edema.  Palpable distal pulses.   Skin: No obvious rashes or skin lesions.    Labs:  I have reviewed the labs from this hospitalization and the ones on the day of discharge and have followed up on any pending labs at the time of discharge.  See Epic Labs section for details.         Assessment/Plan:     Problem List Items Addressed This Visit       COPD (chronic obstructive pulmonary disease) with emphysema    History of small bowel obstruction - Primary        {DischargeMedRec:50284}      The following medications changes were made: ***    Biggest Risk for Readmission: ***    Items to follow-up on next visit: ***    Medical decision making was of { :42768} complexity.    Necessary referral have been made.  See Visit Summary for details of referrals.    I will forward my plan and recommendations to patients PCP, Loreen Roers, MD    Follow-up with PCP or another provider has been scheduled:   Future Appointments   Date Time Provider Department Center   04/20/2024  2:20 PM [facility] TRANSITION CLINIC [facility]   04/20/2024  3:00 PM Carolan Chuck, MD [facility]   04/24/2024 10:00 AM Alyson Back, MD [facility]   05/27/2024  2:20 PM PFT 1 [facility]   05/27/2024  3:00 PM Alphonsa Asai, MD [facility]   07/10/2024 10:00 AM Mazzola Poli de Figueiredo, Nieves Bars, MD [facility]        Total time spent face to face with the patient was 40 minutes of  which *** minutes were spent counseling/coordinating care regarding her: recent hospitalization and the following conditions: ***         [facility]  at [facility]  [address], [place], [place] [address]  Telephone [phone]  Fax [phone]  [email or web address]

## 2024-04-20 DIAGNOSIS — I639 Cerebral infarction, unspecified: Principal | ICD-10-CM

## 2024-04-20 DIAGNOSIS — E1149 Type 2 diabetes mellitus with other diabetic neurological complication: Principal | ICD-10-CM

## 2024-04-20 NOTE — Unmapped (Cosign Needed)
 [facility] [place] [facility]  Care Management Progress Note    Date: 04/20/2024  Outcome:  Phone contact completed    Purpose of contact:           [employer] returned call. Stated they don't have the patient's daughter preferred product. Similar product: 948 - [employer], heavy absorber pads. Patient agreeable to this. Confirmed with [employer] that updated visit notes are needed.     Patient's daughter states she called [facility] and unfortunately this will not be an option for the patient. They will do outpatient but won't pickup at a residential address. CM called [facility]. Stated they will do residential transport to clinic visits, but cannot do point to point within [facility] and it [place].     Called patient's daughter back to continue to explore options as all ambulance outpatient transportation options CM has located has not been an option. Patient agreed CM can contact CAP/DA provider to discuss options - Creighton Doffing per chart and confrimed with patient's daughter. CM attempted to call direct line previously in the chart but was not able to reach a voicemail. Contacted [facility] and left VM requesting connection to [facility].     Daughter reported she is till waiting on hoyer lift as it was supposed to come today but has now been pushed back to Thursday. Expects that PT will help train on this but will clarify with [facility] today. Did not get electric wheelchair at this time, as there is an issue with Medicaid billing and providers stating patient received wheelchair and it was paid for from [employer]. Daughter states they do not have this wheelchair right now. CM to follow up.     As stretcher transport has not been able to be scheduled, patient and CM rescheduled transitions clinic to Thursday and scheduled video visit with PCP for tomorrow. Patient's daughter still would like an in-person visit but ok with video visit for tomorrow while transport/support is put in place. Patient has leg swelling daughter would like to get checked out.     Update 4/21 - Received call back from [facility], provided work cell [phone]. No VM set up, unable to leave message.    Additional Information/Plan:  Patient provided my direct contact information and encouraged to contact me should additional needs arise.    Time Spent Per Day:  Chart review was completed prior to outreach attempt.   04/20/2024: 60    Paulita Boss, MSW  [facility] FAMILY MEDICINE [place]

## 2024-04-20 NOTE — Unmapped (Unsigned)
 Reason for Visit: Hospital Follow-up Medication Management    History of Present Illness:  Tammy Braun is a 83 y.o. female with a past medical history of Afib, schizoaffective disorder, bladder cancer s/p bladder removal w/ ileal-conduit (2012), COPD, HTN, T2DM (diet-controlled), and tobacco use who was recently hospitalized from 02/26/24 to 04/14/24 for vomiting + abdominal pain 2/2 SBO s/p ex-lap + reduction of parastomal hernia + small bowel resection w/ primary anastomosis + lysis of adhesions with admission c/b need for repeat attempts for abdominal closure (successful closure on 3/4) + multiple failed extubations (final successful extubation on 3/23) + aspiration PNA + AKI from ATN requiring CRRT + acute segmental PE seen on CT A/P 3/5 for which pt was started on Eliquis  (also for Afib hx). Given pt BP + HR controlled without home meds, multiple agents held at discharge (amlodipine , candesartan , furosemide , diltiazem ). Pt presents to the [facility] for follow-up {Blank single:19197::with,without} all of her medication bottles.     Pre-charting  - PMH: Afib, schizoaffective disorder, bladder cancer s/p bladder removal w/ ileal-conduit (2012), COPD, HTN, T2DM (diet-controlled), and tobacco use  - Insurance: Medicare/Medicaid  - Summary of recent hospital stay:   - Dates admitted: 2/26-4/15   - Reason for admission: vomiting + abdominal pain 2/2 SBO   - CTAP w/ SBO w/ transition point at ileal conduit, NG tube placed   - Leukocytosis (hemoconcentration) + AKI + mildly elevated lactate   - Urology consulted for possible ileal conduit revision   - Increased O2 needs requiring intubation in ICU   - OR 2/28 for ex-lap, reduction of parastomal hernia, small bowel resection w/ primary anastomosis, lysis of adhesions, abdomen left open   - OR 3/2 for attempt at abdominal closure, left open d/t desats   - OR 3/4 for successful abdominal closure   - CTAP 3/7 w/ mild dilation of multiple fluid-filled loops of small bowel c/f recurrent SBO, pt made NPO w/ NG tube decompression   - Once having BM, restarted tube feeds but unable to maintain goals d/t emesis, which ultimately led to aspiration PNA + multi-pressor shock, needed TPN in the interim   - Extubated on 3/10 but later re-intubated on 3/12 for AHRF 2/2 mucus plug > extubated again 3/15 until 3/20 re-intubated for mucus plugging w/ final extubation 3/23   - BAL sent 3/20  - CCRT d/c'd on 3/11  - Amiodarone  started in ICU, continued per cards during admission but stopped at d/c; started on Eliquis  after transition from heparin /Lovenox  (not on anticoag prior to admission)  - ?PE per filling defect in L anterior segmental artery on 3/5 CTA chest?  - PsA PNA resistant to meropenem  + Zosyn    - Labs at discharge: WBC 7.1, Hgb 9.4, plt 295, Scr 0.85, K 4.5, Na 141, Mg 1.8   - Med changes at discharge: HOLD amlodipine  10 mg, HOLD furosemide  20 mg, START Eliquis  5 mg BID, START APAP 500 mg q6h + oxy 5 mg q4h PRN, START Miralax  17g BID + senna 1 tab qPM, STOP diltiazem  120 mg, STOP candesartan  16 mg, STOP tramadol   - Other notes:   - Problem list and some notes report CVA but no record of this, 1 note that says possible TBI and unlikely CVA?   - CHADS-VASC score = 5 (age >74 x2, female, HTN, DM)   - F/u appts: psych 4/25, pulm 5/28, gen surg 7/11   - Hx anaphylaxis w/ ACEi  - Future considerations:   -  Clarify if prior stroke or MI hx (unclear per chart notes)?    Since discharge, ***    Questions for today:  - BM/PO intake?  - S/sx infection?  - S/sx bleeding (on Eliquis now)?  - BP/HR control?  - Add back ARB vs CCB?    Medication Adherence and Access:  Missed doses?: {Blank multiple:19197::yes,no,***}  Uses pillbox?: {Blank multiple:19197::yes,no,***}  Anyone else assist with medication organization? {Blank multiple:19197::yes,no,***}  Current insurance coverage: Medicare/Medicaid  Preferred Pharmacy: ***   Medications affordable?: {Blank multiple:19197::yes,no,***}  Needs refills? {Blank multiple:19197::yes,no,***}    Allergies:   Allergies   Allergen Reactions    Lisinopril Anaphylaxis and Swelling    Losartan Dizziness    Hctz [Hydrochlorothiazide]      SIADH       Medications: Medications reviewed in EPIC medication station and updated today by the clinical pharmacist practitioner.  Current Outpatient Medications on File Prior to Visit   Medication Sig Notes    acetaminophen (TYLENOL) 500 MG tablet Take 1 tablet (500 mg total) by mouth every six (6) hours.     albuterol HFA 90 mcg/actuation inhaler Inhale 2 puffs every four (4) hours as needed for wheezing.     [Paused] amlodipine (NORVASC) 10 MG tablet Take 1 tablet (10 mg total) by mouth daily.     apixaban (ELIQUIS) 5 mg Tab Take 1 tablet (5 mg total) by mouth two (2) times a day.     atorvastatin (LIPITOR) 40 MG tablet Take 1 tablet (40 mg total) by mouth daily.     blood sugar diagnostic (GLUCOSE BLOOD) Strp Disp test strips preferred by insurance plan. Testing qday, Dx: E11.9 (Type 2 DM- controlled)     blood-glucose meter kit Disp. blood glucose meter kit preferred by patient's insurance. Check blood sugars as directed by provider. Dx: Diabetes, E11.9     cyclobenzaprine (FLEXERIL) 5 MG tablet Take 1 tablet (5 mg total) by mouth two (2) times a day as needed for muscle spasms.     fluPHENAZine decanoate (PROLIXIN) 25 mg/mL injection Inject 2 mL (50 mg total) into the muscle every thirty (30) days.     [Paused] furosemide (LASIX) 20 MG tablet TAKE 2 TABLETS BY MOUTH DAILY FOR 5 DAYS, THEN 1 TABLET DAILY     gabapentin (NEURONTIN) 100 MG capsule Take 1 capsule (100 mg total) by mouth Three (3) times a day.     inhalational spacing device (E-Z SPACER) Spcr 1 each by Miscellaneous route every four (4) hours as needed.     lancets Misc Disp. lancets #30 or amount allowed, Testing once Qday. Dx: E11.9 (Diabetes- controlled)     MEDICAL SUPPLY ITEM Entreal formula nutritionally supplement complete caloric density     MEDICAL SUPPLY ITEM Compression stockings knee high     [EXPIRED] oxyCODONE (ROXICODONE) 5 MG immediate release tablet Take 1 tablet (5 mg total) by mouth every four (4) hours as needed for up to 5 days.     polyethylene glycol (MIRALAX) 17 gram packet Take 17 g by mouth two (2) times a day.     senna (SENOKOT) 8.6 mg tablet Take 1 tablet by mouth nightly.     trolamine salicylate (ASPERCREME TOP) Apply 1 Application topically as needed (pain).          Assessment and Plan:     # ***      # ***       # Medication Management   Encouraged use of medication pill box and reviewed appropriate  use. Reviewed the indication, dose, and frequency of each medication with patient.       Recommendations and medication-related problems were discussed directly with the patient's transitions of care physician, Dr. Genita Keys, immediately following the pharmacist visit prior to the physician visit. Questions/concerns were addressed to the patient's satisfaction. I spent a total of {NUMBERS; 0-45 BY 5:10291} minutes {VISIT UJWJ:19147} with the patient delivering clinical care and providing education/counseling.      Future Appointments   Date Time Provider Department Center   04/20/2024  2:20 PM [facility] TRANSITION CLINIC [facility]   04/20/2024  3:00 PM Carolan Chuck, MD [facility]   04/24/2024 10:00 AM Alyson Back, MD [facility]   05/27/2024  2:20 PM PFT 1 [facility]   05/27/2024  3:00 PM Smith, Katelynn M, MD [facility]   07/10/2024 10:00 AM Mazzola Myrle Aspen, MD [facility]     ___________________________________________________   Derril Flint, PharmD, CPP, [facility]  Family Medicine Clinical Pharmacist

## 2024-04-21 ENCOUNTER — Encounter: Admit: 2024-04-21 | Discharge: 2024-04-22 | Payer: MEDICARE

## 2024-04-21 NOTE — Unmapped (Signed)
 Phone Visit Note  This medical encounter was conducted virtually using Epic@  TeleHealth protocols.      I have identified myself to the patient and conveyed my credentials to Tammy Braun  Patient/proxy has provided verbal consent to proceed with this phone visit.  In case we get disconnected, patient's phone number is [phone] (home)       [name] is a 83 y.o. 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 I conducted a phone visit.  I spent 30 minutes on the phone call with the patient on the date of service .        Subjective:     HPI  Tammy Braun is a 83 y.o. female requesting a telephone visit to discuss the following issues:    Telephone following up for hospitalization prolonged  See discharge statement  SBO with delayed wound healing (left open)  Eventually home after 3 week hospitalization  Weak  Needs power wheelchair      ROS  As per HPI.      HISTORY:  I have reviewed the patient's problem list, current medications, and allergies and have updated/reconciled them as needed.      Objective:     Patient Reported Blood Pressure Readings    As part of this Telephone Visit, no in-person exam was conducted.

## 2024-04-22 DIAGNOSIS — I639 Cerebral infarction, unspecified: Principal | ICD-10-CM

## 2024-04-22 DIAGNOSIS — E1149 Type 2 diabetes mellitus with other diabetic neurological complication: Principal | ICD-10-CM

## 2024-04-22 NOTE — Unmapped (Cosign Needed)
 [facility] FAMILY MEDICINE [place] POPULATION HEALTH  Care Management Progress Note    Date: 04/22/2024  Outcome:  Phone outreach completed    Purpose of contact:           CM attempted to contact to follow up on care plan, DME, and transport for transitions of care appointment tomorrow. Mailbox full, unable to leave VM.    [facility] nurse came, was able to order more wound supplies.Sherrlyn Dolores came down yesterday with a contractor for modifications under CAP/DA with bathroom ada accessible, needs to be able to roll lift there. [name] discussed extensively dissatisfaction with this process and states will find her own [employer].    New lift coming tomorrow, aides know how to use. Nurse coming out twice per week, OT/PT/Wound care - [facility].    Discussed transportation to tomorrow's [facility] clinic. States she will have an aide or friend available to help get patient into the car for transportation, and sure aides will be in place for getting home after. Confirmed time/place of TOC appt.    DME NEEDS:  CM to coordinate power wheelchair  Incontinence supplies - needs information in note regarding medical necessity, patient's daughter requesting ultra absorb bed pads and bedside cleaning spray    Additional Information/Plan:  Patient provided my direct contact information and encouraged to contact me should additional needs arise.    Time Spent Per Day:  Chart review was completed prior to outreach attempt.   04/22/2024: 30    Paulita Boss, MSW  [facility] FAMILY MEDICINE [place]

## 2024-04-23 ENCOUNTER — Ambulatory Visit: Admit: 2024-04-23 | Discharge: 2024-04-24 | Payer: MEDICARE

## 2024-04-23 ENCOUNTER — Ambulatory Visit
Admit: 2024-04-23 | Discharge: 2024-04-24 | Payer: MEDICARE | Attending: Student in an Organized Health Care Education/Training Program | Primary: Student in an Organized Health Care Education/Training Program

## 2024-04-23 LAB — LIPID PANEL
CHOLESTEROL: 141 mg/dL (ref <200)
HDL CHOLESTEROL: 53 mg/dL (ref >50)
LDL CHOLESTEROL CALCULATED: 75 mg/dL (ref <100)
NON-HDL CHOLESTEROL: 88 mg/dL (ref <130)
TRIGLYCERIDES: 88 mg/dL (ref <150)

## 2024-04-23 LAB — BASIC METABOLIC PANEL
ANION GAP: 10 mmol/L (ref 5–14)
BLOOD UREA NITROGEN: 20 mg/dL (ref 9–23)
BUN / CREAT RATIO: 24
CALCIUM: 9.1 mg/dL (ref 8.7–10.4)
CHLORIDE: 108 mmol/L — ABNORMAL HIGH (ref 98–107)
CO2: 25 mmol/L (ref 20.0–31.0)
CREATININE: 0.84 mg/dL (ref 0.55–1.02)
EGFR CKD-EPI (2021) FEMALE: 70 mL/min/1.73m2 (ref >=60)
GLUCOSE RANDOM: 201 mg/dL — ABNORMAL HIGH (ref 70–179)
POTASSIUM: 4 mmol/L (ref 3.4–4.8)
SODIUM: 143 mmol/L (ref 135–145)

## 2024-04-23 LAB — HEMOGLOBIN A1C
ESTIMATED AVERAGE GLUCOSE: 114 mg/dL
HEMOGLOBIN A1C: 5.6 % (ref 4.8–5.6)

## 2024-04-23 MED ORDER — ASPERCREME 10 % TOPICAL
Freq: Two times a day (BID) | TOPICAL | 0 refills | 0.00 days | Status: CP
Start: 2024-04-23 — End: 2024-10-20

## 2024-04-23 MED ORDER — FUROSEMIDE 20 MG TABLET
ORAL_TABLET | Freq: Every day | ORAL | 5 refills | 30.00 days | Status: CP
Start: 2024-04-23 — End: 2024-10-20

## 2024-04-23 MED ORDER — DILTIAZEM CD 120 MG CAPSULE,EXTENDED RELEASE 24 HR
ORAL_CAPSULE | Freq: Every day | ORAL | 11 refills | 30.00 days
Start: 2024-04-23 — End: 2025-04-23

## 2024-04-23 MED ORDER — UNDERPADS 30" X 30"
0 refills | 0.00 days
Start: 2024-04-23 — End: ?

## 2024-04-23 NOTE — Unmapped (Signed)
 Transitions of Care Note    Subjective      Admission Date: 02/26/24  Discharge Date: 04/14/24  Discharge Hospital/Unit: 5 BT [facility]  The patient was [facility] and sent to her home.    Post discharge interactive communication via telephone was made with patient on 04/22/24 and I have reviewed the information from that communication.    Today's (04/24/2024) face to face interactive visit is within 14 days days of discharge.    Chief Complaint: Patient is here in follow-up to their recent hospitalization and to discuss the following medical problems:    HISTORY OF PRESENT ILLNESS:    Tammy Braun is a 83 y.o. female who presents for transitions hospital follow up.    Tammy Braun is an 83 y.o. 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      [name] and aide report that yesterday when transitioning [name] to the bedside commode, she couldn't quite make it and they had to slowly lower her to the floor. She did not hit her head or any other parts of her body, but aide reports she needed to call paramedics per protocol. Per daughter, paramedics reported [name] heart was skipping a beat.     [name] is worried about the swelling in her feet. Per daughter, Lasix was stopped in hospital due to her kidney function. Daughter also reports diltiazem was stopped due to low HRs (though upon later review by physician, determined that pt is still taking diltiazem). Pt denies s/sx of bleeding on Eliquis but daughter reports pt easily bruising when they try to lift her. Ms. Strozewski cannot support her own weight.     ** primarily concerned about the swelling in her feet -- ~1-2+ pitting edema b/l. Not wearing compression stockings. Daughter and aid present today -- requesting incontinence supplies, and referrals -- wound clinic, surgery f/up, nephrology.     Pt reported factors contributing to admission or ED visit: None    Patient uses pill box yes    PHQ-9 PHQ-9 Total Score   01/31/2024   9:30 AM 5   04/19/2023  10:00 AM 12   11/02/2022  11:00 AM 10   07/27/2022  10:00 AM 10   04/13/2022  11:00 AM 0   09/23/2020  10:00 AM 4   10/18/2017  10:00 AM 10       The remaining 10 systems reviewed were negative.      Patient has been seen by the pharmacist today and I have reviewed their note and recommendations. See pharmacist note for details.  Patient has been seen by the social worker today and I have reviewed their note and recommendations.  See social work note for details.    I have reviewed the patients discharge summary for this hospitalization.  I have also reviewed the problem list, allergies, family and social history and updated them as needed.         Objective     [name]  temporal temperature is 35.8 ??C (96.4 ??F). Her blood pressure is 119/74 and her pulse is 73. Her oxygen saturation is 98%.     GEN: chronically ill appearing. Sitting in wheel chair. Appears uncomfortable.   HEENT: NCAT, No scleral icterus.  DMM  CV: regular rate. Irregularly irregular rhythm  Pulm: Normal work of breathing on RA. CTAB. No wheezing, crackles, or rhonchi.   Abd: Flat.  Nontender. No guarding, rebound.  Normoactive bowel sounds.  stoma RLQ, large post-surgical incision covered in c/d/I dressing. No surrounding erythema, drainage. Part of wound visualized and appears to be healing   Neuro: A&O x 3. No focal deficits.   Ext: 1-2 + peripheral edema.  Palpable distal pulses.   Skin: No obvious rashes    Labs:  I have reviewed the labs from this hospitalization and the ones on the day of discharge and have followed up on any pending labs at the time of discharge.  See Epic Labs section for details.         Assessment/Plan:     Problem List Items Addressed This Visit DM (diabetes mellitus), type 2 with neurological complications    Lab Results   Component Value Date    A1C 5.6 04/23/2024     At goal. C/b peripheral neuropathy. Not currently on medications.     F/up PCP for ongoing monitoring            Relevant Orders    Hemoglobin A1c (Completed)    HTN (hypertension)    BP Readings from Last 3 Encounters:   04/23/24 119/74   04/14/24 151/89   02/26/24 104/84     At goal today.     Continue diltiazem 120 mg every day  RESTART Lasix 20 mg daily          Relevant Medications    furosemide (LASIX) 20 MG tablet    dilTIAZem (CARDIZEM CD) 120 MG 24 hr capsule    Other Relevant Orders    Basic metabolic panel (Completed)    Chronic renal disease, stage 2, mildly decreased glomerular filtration rate (GFR) between 60-89 mL/min/1.73 square meter    Lab Results   Component Value Date    CREATININE 0.84 04/23/2024     After prolonged hospitalization c/b acute renal failure, requiring CRRT. Her kidney function has remarkably recovered. Will get updated bmp today.     [ ]  patient's daughter would like nephrology referral - defer discussion to PCP         Relevant Medications    furosemide (LASIX) 20 MG tablet    Peripheral neuropathy    Chronic, stable.     Continue gabapentin PRN          Schizoaffective disorder    Continue prolixin injection per psychiatry            Carotid arterial disease (CMS-HCC) < 40% bilaterally 2021    Lipid panel today. Patient's daughter believes lipitor is for her afib. If her cholesterol levels are normal, she would like to stop lipitor. Will reeducate about statin use for ASCVD risk and comorbid diabetes + age >15 y.o.     Continue lipitor            Relevant Orders    Lipid Panel (Completed)    Atrial fibrillation, unspecified type - Primary    Rate controlled today. Patient IS still taking Diltiazem 120 mg every day.     Cont dilt  Continule eliquis 5 mg BID   STOP aspirin (was taking 325 mg every day)   F/up cardiology            Relevant Medications furosemide (LASIX) 20 MG tablet    dilTIAZem (CARDIZEM CD) 120 MG 24 hr capsule    Urinary incontinence  Urinary incontinence. Unable to bear weight on feet currently and needing daily diuretic. Will order bed pads and bedside spray.     Medicaid: Medicaid covers certain quantities of incontinence supplies per month. Patient requires the following: Pull-ups/underwear: 200 per month, Underpads (aka Chux or chucks pads): 150 per month, Gloves: 2 boxes of gloves per month (about 400 gloves total) are medically necessary due to incontinence, inability to bear weight to transfer to toilet, and Wipes: only covered by certain types of Medicaid, such as [place] [employer]. Desired quantity: 2 packages per month (about 600 wipes), unscented.           Relevant Medications    underpads 2.6 X 2.9 feet Pads    miscellaneous medical supply Misc    Other Relevant Orders    Home Medical Equipment    Vitamin D deficiency    Get updated vit d level today          Relevant Orders    Vitamin D 25 Hydroxy (25OH D2 + D3)    S/P exploratory laparotomy    S/p ex lap -- c/b inability to be surgically closed. 2 additional surgeries required. Ultimately, surgical incision healing w/ secondary intention. Daughter packing w/ wet/dry. She does not trust home health to perform wound care and would like referral to wound clinic.     Wound clinic placed today          Relevant Orders    Ambulatory referral to Wound Clinic        Medications prescribed or ordered upon discharge were reviewed on 04/23/24 and reconciled with the most recent outpatient medication list. I have reviewed and agree with this medication reconciliation.      The following medications changes were made:   STOP aspirin 325 mg every day   STOP amlodipine   RESTART lasix 20 mg every day       Biggest Risk for Readmission: post-op complication, need for rehab    Items to follow-up on next visit:   Add back ARB if able w/ known DM/CKD  Discuss desired referrals and determine if medically necessary  Discuss indication for statin     Medical decision making was of moderate (16109- must be seen within 14 days) complexity.    Necessary referral have been made.  See Visit Summary for details of referrals.    I will forward my plan and recommendations to patients PCP, Loreen Roers, MD    Follow-up with PCP or another provider has been scheduled:   Future Appointments   Date Time Provider Department Center   05/12/2024 10:20 AM Loreen Roers, MD [facility]   05/27/2024  2:20 PM PFT 1 [facility]   05/27/2024  3:00 PM Alphonsa Asai, MD [facility]   07/09/2024 10:00 AM Mazzola Poli de Figueiredo, Nieves Bars, MD [facility]        Total time spent face to face with the patient was 40 minutes of  which 40 minutes were spent counseling/coordinating care regarding her: recent hospitalization          [facility]  at [facility]  [address] [place], [place] [address]  Telephone [phone]  Fax [phone]  [email or web address]

## 2024-04-23 NOTE — Unmapped (Addendum)
 Restart Lasix  20 mg ONCE daily  Refill aspercreme for leg pain   Continue Diltiazem  120 mg daily   4. Continue Eliquis  5 mg twice daily   5. STOP aspirin  325mg  - it is not safe to take this with Eliquis     Labs

## 2024-04-23 NOTE — Unmapped (Signed)
 Reason for Visit: Hospital Follow-up Medication Management    History of Present Illness:  Tammy Braun is an 83 y.o. 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 Pt presents to the [facility] for follow-up without all of her medication bottles.     Since discharge, pt reports doing pretty well with good PO intake (3 meals/day). Reports having BMs regularly and takes Miralax  PRN. Has BP cuff at home but does not routinely monitor BP/HR. Inquires about pulse oximetry to monitor oxygen levels.    [name] and aide report that yesterday when transitioning Tammy Braun to the bedside commode, she couldn't quite make it and they had to slowly lower her to the floor. She did not hit her head or any other parts of her body, but aide reports she needed to call paramedics per protocol. Per daughter, paramedics reported Tammy Braun heart was skipping a beat.    Tammy Braun is worried about the swelling in her feet. Per daughter, Lasix  was stopped in hospital due to her kidney function. Daughter also reports diltiazem  was stopped due to low HRs (though upon later review by physician, determined that pt is still taking diltiazem ). Pt denies s/sx of bleeding on Eliquis  but daughter reports pt easily bruising when they try to lift her. Tammy Braun cannot support her own weight.      Medication Adherence and Access:  Missed doses?: no  Uses pillbox?: no - would like one   Anyone else assist with medication organization? no - daughter oversees  Current insurance coverage: Medicare/Medicaid  Preferred Pharmacy: [facility] [address] in [place], [place]  Medications affordable?: yes  Needs refills? yes - Lasix     Allergies:   Allergies   Allergen Reactions    Lisinopril Anaphylaxis and Swelling    Losartan  Dizziness    Hctz [Hydrochlorothiazide ]      SIADH       Medications: Medications reviewed in EPIC medication station and updated today by the clinical pharmacist practitioner.  Current Outpatient Medications on File Prior to Visit   Medication Sig Comments    acetaminophen  (TYLENOL ) 500 MG tablet Take 1 tablet (500 mg total) by mouth every six (6) hours. Taking - twice a day as needed    albuterol  HFA 90 mcg/actuation inhaler Inhale 2 puffs every four (4) hours as needed for wheezing. Taking    [Paused] amlodipine  (NORVASC ) 10 MG tablet Take 1 tablet (10 mg total) by mouth daily. Not taking     apixaban  (ELIQUIS ) 5 mg Tab Take 1 tablet (5 mg total) by mouth two (2) times a day. Taking     aspirin  325 mg tablet Take 1 tablet by mouth daily Taking    atorvastatin  (LIPITOR ) 40 MG tablet Take 1 tablet (40 mg total) by mouth daily. Taking     blood sugar diagnostic (GLUCOSE BLOOD) Strp Disp test strips preferred by insurance plan. Testing qday, Dx: E11.9 (Type 2 DM- controlled) Supplies    blood-glucose meter kit Disp. blood glucose meter kit preferred by patient's insurance. Check blood sugars as directed by provider. Dx: Diabetes, E11.9 Supplies     cyclobenzaprine  (FLEXERIL ) 5 MG tablet Take 1 tablet (5 mg total) by mouth two (2) times a day as needed for muscle spasms. Taking  fluPHENAZine decanoate (PROLIXIN) 25 mg/mL injection Inject 2 mL (50 mg total) into the muscle every thirty (30) days. Taking     [Paused] furosemide (LASIX) 20 MG tablet TAKE 2 TABLETS BY MOUTH DAILY FOR 5 DAYS, THEN 1 TABLET DAILY Not taking     gabapentin (NEURONTIN) 100 MG capsule Take 1 capsule (100 mg total) by mouth Three (3) times a day. Taking as needed - for a few days at a time    inhalational spacing device (E-Z SPACER) Spcr 1 each by Miscellaneous route every four (4) hours as needed. Supplies    lancets Misc Disp. lancets #30 or amount allowed, Testing once Qday. Dx: E11.9 (Diabetes- controlled) Supplies MEDICAL SUPPLY ITEM Entreal formula nutritionally supplement complete caloric density Supplies    MEDICAL SUPPLY ITEM Compression stockings knee high Supplies    [EXPIRED] oxyCODONE (ROXICODONE) 5 MG immediate release tablet Take 1 tablet (5 mg total) by mouth every four (4) hours as needed for up to 5 days. Not taking     polyethylene glycol (MIRALAX) 17 gram packet Take 17 g by mouth two (2) times a day. Taking    senna (SENOKOT) 8.6 mg tablet Take 1 tablet by mouth nightly. Taking     trolamine salicylate (ASPERCREME TOP) Apply 1 Application topically as needed (pain). Taking - needs refill       Assessment and Plan:     # Afib - HTN - Prior CVA  Patient recently diagnosed with Afib in early 2025 with CHADS2VASC score of 6 warranting anticoagulation. Eliquis 5 mg BID was started during hospital stay which pt is currently taking. Pt also says she is taking apsirin 325 mg daily as she was before her hospitalization. Pt is at increased risk of bleeding with aspirin + Eliquis and no apparent indication for aspirin, especially at high dose of 325mg /day. Prior to admission, patient was started on diltiazem which was held due to poor absorption in setting of SBO. She was switched to amiodarone in the ICU, and then came off of amiodarone by discharge. Per dc instructions, pt to hold diltiazem (daughter reports this was due to low HR), however, pt reports still taking at today's visit. BP is stable at 119/74 and HR stable at 73. Given pt's Afib and stable vitals today, ok to continue diltiazem. Pt previously taking amlodipine but held on discharge as instructed. Given BP stable at today's visit and side effect of LEE with amlodipine, will discontinue at today's visit with which pt and daughter agreed.  STOP aspirin 325 mg daily   STOP amlodipine 10 mg daily   Continue diltiazem 120 mg daily  Continue Eliquis 5 mg BID    # CKD - Stage G2A2  eGFR today 70 which is stable from 69 mL/min on 04/13/24. Last UACR 52.6 on 12/15/19, pt due for updated UACR today but will likely be unable to complete due to barriers with movement. Per pt's daughter, furosemide held on discharge due to kidney function, which is stable today. Pt reports leg swelling and would like to re-start furosemide.  Restart furosemide 20 mg once daily   Consider restarting ARB in the future if BP and renal function remain stable and can tolerate (previously on candesartan but dc'd during admission due to low BPs)   Consider SGLT2 in the future given benefit in DM as well (of note, A1c 6.8% on 12/11/23, though 5.6% on labs today)  UACR at next visit when feasible     # Medication Management   Refill  sent for aspercreme topical BID for leg pain  Labs today  A1c  Vitamin D  Lipids  BMP  Encouraged use of medication pill box and reviewed appropriate use. Reviewed the indication, dose, and frequency of each medication with patient.       Recommendations and medication-related problems were discussed directly with the patient's transitions of care physician, Dr. Brynn Caras, immediately following the pharmacist visit prior to the physician visit. Questions/concerns were addressed to the patient's satisfaction. I spent a total of 30 minutes face to face with the patient delivering clinical care and providing education/counseling.      Future Appointments   Date Time Provider Department Center   04/24/2024 10:00 AM Alyson Back, MD [facility]   05/12/2024 10:20 AM Loreen Roers, MD [facility]   05/27/2024  2:20 PM PFT 1 [facility]   05/27/2024  3:00 PM Smith, Katelynn M, MD [facility]   07/09/2024 10:00 AM Mazzola Poli de Figueiredo, Sergio, MD [facility]     ___________________________________________________      Jorja Newport, PharmD  PGY1 Pharmacy Resident    Romelle Clutter, PharmD, BCACP, CPP  Family Medicine Pharmacist

## 2024-04-23 NOTE — Unmapped (Signed)
 CM did not meet with patient today d/t late arrival; patient is well supported in CCM program with Allie Miller.

## 2024-04-24 ENCOUNTER — Ambulatory Visit: Admit: 2024-04-24 | Payer: MEDICARE

## 2024-04-24 MED ORDER — UNDERPADS 2.6 X 2.9 FEET
Freq: Two times a day (BID) | 3 refills | 0.00000 days | Status: CP
Start: 2024-04-24 — End: ?

## 2024-04-24 MED ORDER — DILTIAZEM CD 120 MG CAPSULE,EXTENDED RELEASE 24 HR
ORAL_CAPSULE | Freq: Every day | ORAL | 11 refills | 30.00000 days | Status: CP
Start: 2024-04-24 — End: 2025-04-24

## 2024-04-24 MED ORDER — MISCELLANEOUS MEDICAL SUPPLY MISC
Freq: Four times a day (QID) | 0 refills | 0.00000 days | Status: CP
Start: 2024-04-24 — End: ?

## 2024-04-24 NOTE — Unmapped (Signed)
 Rate controlled today. Patient IS still taking Diltiazem 120 mg every day.     Cont dilt  Continule eliquis 5 mg BID   STOP aspirin (was taking 325 mg every day)   F/up cardiology

## 2024-04-24 NOTE — Unmapped (Signed)
 Lipid panel today. Patient's daughter believes lipitor is for her afib. If her cholesterol levels are normal, she would like to stop lipitor. Will reeducate about statin use for ASCVD risk and comorbid diabetes + age >81 y.o.     Continue lipitor

## 2024-04-24 NOTE — Unmapped (Signed)
 Urinary incontinence. Unable to bear weight on feet currently and needing daily diuretic. Will order bed pads and bedside spray.     Medicaid: Medicaid covers certain quantities of incontinence supplies per month. Patient requires the following: Pull-ups/underwear: 200 per month, Underpads (aka Chux or chucks pads): 150 per month, Gloves: 2 boxes of gloves per month (about 400 gloves total) are medically necessary due to incontinence, inability to bear weight to transfer to toilet, and Wipes: only covered by certain types of Medicaid, such as [place] Access. Desired quantity: 2 packages per month (about 600 wipes), unscented.

## 2024-04-24 NOTE — Unmapped (Signed)
 Get updated vit d level today

## 2024-04-24 NOTE — Unmapped (Signed)
 BP Readings from Last 3 Encounters:   04/23/24 119/74   04/14/24 151/89   02/26/24 104/84     At goal today.     Continue diltiazem 120 mg every day  RESTART Lasix 20 mg daily

## 2024-04-24 NOTE — Unmapped (Signed)
 Continue prolixin injection per psychiatry

## 2024-04-24 NOTE — Unmapped (Signed)
 Lab Results   Component Value Date    CREATININE 0.84 04/23/2024     After prolonged hospitalization c/b acute renal failure, requiring CRRT. Her kidney function has remarkably recovered. Will get updated bmp today.     [ ]  patient's daughter would like nephrology referral - defer discussion to PCP

## 2024-04-24 NOTE — Unmapped (Signed)
 S/p ex lap -- c/b inability to be surgically closed. 2 additional surgeries required. Ultimately, surgical incision healing w/ secondary intention. Daughter packing w/ wet/dry. She does not trust home health to perform wound care and would like referral to wound clinic.     Wound clinic placed today

## 2024-04-24 NOTE — Unmapped (Signed)
 [facility] FAMILY MEDICINE [place] POPULATION HEALTH  Care Management Progress Note    Date: 04/24/2024  Outcome:  Phone outreach completed    Purpose of contact:           CM contacted [employer] to confirm issue with the wheelchair. Staff member stated that the patient already received wheelchair from [employer] [phone]). Per note written by CM on 11/22/2023, CM already spoke to [facility] and confirmed that the order had been placed but the patient/family declined the order. This was relayed to the family at this time and CM and patient's daughter discussed a new evaluation.     Staff message sent to PCP requesting an order for PT for [facility] eval.    Additional Information/Plan:  Patient provided my direct contact information and encouraged to contact me should additional needs arise.    Time Spent Per Day:  Chart review was completed prior to outreach attempt.   04/24/2024: 10    Paulita Boss, MSW  [facility] FAM[facility] MEDIC[facility] [place]

## 2024-04-24 NOTE — Unmapped (Signed)
 Chronic, stable.     Continue gabapentin PRN

## 2024-04-24 NOTE — Unmapped (Signed)
 Lab Results   Component Value Date    A1C 5.6 04/23/2024     At goal. C/b peripheral neuropathy. Not currently on medications.     F/up PCP for ongoing monitoring

## 2024-04-25 LAB — VITAMIN D 25 HYDROXY: VITAMIN D, TOTAL (25OH): 26.3 ng/mL (ref 20.0–80.0)

## 2024-04-27 DIAGNOSIS — G603 Idiopathic progressive neuropathy: Principal | ICD-10-CM

## 2024-04-27 DIAGNOSIS — F259 Schizoaffective disorder, unspecified: Principal | ICD-10-CM

## 2024-04-27 NOTE — Unmapped (Signed)
 Copied from CRM #[id number]. Topic: Access To Clinicians - Req Clinic Call Back  >> Apr 27, 2024 11:18 AM Silvester Drown T wrote:  Please call patient in regards to concerns

## 2024-04-27 NOTE — Unmapped (Signed)
 Copied from CRM #[id number]. Topic: Access To Clinicians - Req Clinic Call Back  >> Apr 27, 2024 11:00 AM Silvester Drown T wrote:  Patient is calling and stated she wants her PCP to give her a call ( health )  Patient wants [phone]    I reinstated the policy to patient and whomever hopped on the phone to speak 3 times.  I stated I can't transfer calls to the office I gave options on how to get  message across.    Whomever spoke on behalf of the patient kept repeating she wants to be transferred or  a number given to the office when I stated several times that the call center takes care and handle calls    I put the patient on hold to get manager otp but patient hung up when placed on hold

## 2024-04-27 NOTE — Unmapped (Signed)
 error

## 2024-04-27 NOTE — Unmapped (Signed)
 Physicians Order CAPDA printed and placed in your box for signature

## 2024-04-27 NOTE — Unmapped (Signed)
 Copied from CRM #[id number]. Topic: Scheduling - Confirm Appointment  >> Apr 27, 2024  1:11 PM Debbrah Faith wrote:  Caller asked to confirm appointment details    Additional Requests/Needs: Yes Appointment Related: Patient is requesting a phone call from a nurse regarding a shot. The patient preferred contact: Cell Phone Routine callback turnaround time: 24-48 business hours. Programmer, systems Notified)

## 2024-04-27 NOTE — Unmapped (Cosign Needed)
 [facility] FAMILY MEDICINE [place] POPULATION HEALTH  Care Management Progress Note    Date: 04/27/2024  Outcome:   Fax completed    Purpose of contact:           Chart review - Patient visit notes are in including incontinence verbiage. CM faxed referral including demographics, orders, and visit notes from 4/24 to [facility].     Additional Information/Plan:  Patient provided my direct contact information and encouraged to contact me should additional needs arise.    Time Spent Per Day:  Chart review was completed prior to outreach attempt.   04/27/2024: 10      Paulita Boss, MSW  [facility] FAMILY MEDICINE [place]

## 2024-04-27 NOTE — Unmapped (Signed)
 Daughter inquiring about incontinence supplies. She was informed the prescriptions were sent to [facility] and not [facility].

## 2024-04-28 DIAGNOSIS — F259 Schizoaffective disorder, unspecified: Principal | ICD-10-CM

## 2024-04-28 DIAGNOSIS — Z9889 Other specified postprocedural states: Principal | ICD-10-CM

## 2024-04-28 DIAGNOSIS — R531 Weakness: Principal | ICD-10-CM

## 2024-04-28 MED ORDER — FLUPHENAZINE 1 MG TABLET
ORAL_TABLET | ORAL | 0 refills | 0.00000 days | Status: CP
Start: 2024-04-28 — End: ?
  Filled 2024-06-03: qty 5, 30d supply, fill #4

## 2024-04-28 NOTE — Unmapped (Signed)
Form faxed to [phone]

## 2024-04-28 NOTE — Unmapped (Signed)
 Spoke with patient's daughter about her AH since leaving the hospital. Patient is uncomfortable; no safety concerns. Daughter is wondering about options for AH control.     Discussed either adding another dose now of 25 mg prolixin LAI or supplementing with 1 mg BID prn of prolixin until the next scheduled injection. 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     Plan to prescribe prolixin 1 mg BID prn for AH. Daughter to give 1 mg to start to assess tolerability and efficacy.

## 2024-04-30 NOTE — Unmapped (Signed)
 Hi!    Could you schedule this patient with Dr. Felipe Horton or Onc APP at next available?    Thanks!  Ledell Prudent  Urology Resident, PGY3

## 2024-05-01 NOTE — Unmapped (Unsigned)
 Copied from CRM [id number]. Topic: Access To Clinicians - Order Question  >> May 01, 2024  1:06 PM Selena Daily wrote:  RN, Arman Berlin is calling in from [facility] and is  requesting orders for Wound care for patient Sacrum. Patient currently has on a dry dressing.     Wound Care Orders They would like these orders to be placed by patients pcp.    Routine callback turnaround time: 24-48 business hours. Programmer, systems Notified)

## 2024-05-05 NOTE — Unmapped (Signed)
 Copied from CRM #[id number]. Topic: Access To Clinicians - Req Clinic Call Back  >> May 05, 2024  4:44 PM Lindle Rhea wrote:  [facility] home [facility] nurse is calling in regards of this patient .  They have been trying to get new wound care orders for the patient.  They have stated they have tried to reach out several times to this week in regards to that request and havent received an update.    Fax # [phone]  Phone # [phone]

## 2024-05-05 NOTE — Unmapped (Signed)
 [facility] FAMILY MEDICINE [place] POPULATION HEALTH  Care Management Progress Note    Date: 05/05/2024  Outcome:  1st outreach attempt, unable to leave VM due to mailbox full.    Purpose of contact:           Unread MyChart message regarding DME, CM contacted to follow-up on DME, check-in on patient needs, discuss wheelchair evaluation.    Additional Information/Plan:  Patient provided my direct contact information and encouraged to contact me should additional needs arise.    Time Spent Per Day:  Chart review was completed prior to outreach attempt.   05/05/2024: 5    Paulita Boss, MSW  [facility] FAMILY MEDICINE 

## 2024-05-07 DIAGNOSIS — F259 Schizoaffective disorder, unspecified: Principal | ICD-10-CM

## 2024-05-07 DIAGNOSIS — G603 Idiopathic progressive neuropathy: Principal | ICD-10-CM

## 2024-05-07 NOTE — Unmapped (Signed)
 [facility] Home Health Order [id number] printed and placed in your box

## 2024-05-07 NOTE — Unmapped (Unsigned)
 Copied from CRM #[id number]. Topic: Other - Other  >> May 07, 2024  3:32 PM Selena Daily wrote:  Reason of the Call: Nurse Paola Bohr from [facility] is calling in to inform pcp that patients daughter is adding antibacterial ointment to the wound and aren't being compliment with pcps orders regarding patients wound. Paola Bohr is requesting that nurse visits be stopped until after they see Dr. Adrien Horner and he provides patient with skillled nursing orders.    [facility] preferred contact: Phone #[phone]  Routine callback turnaround time: 24-48 business hours. Programmer, systems Notified)

## 2024-05-07 NOTE — Unmapped (Cosign Needed)
 [facility] FAMILY MEDICINE [place] POPULATION HEALTH  Care Management Progress Note    Date: 05/07/2024  Outcome:  Phone outreach completed    Purpose of contact:           Spoke with patient's daughter and informed that new PWC eval has been placed. Patient stated she has been in contact with [employer], patient's daughter states that her wheelchair had been paid for in 2023 which is preventing her from getting another wheelchair. Patient states she has been in contact with [employer] and reported she most recently is awaiting a call back to resolve this issue. CM offered to call on patient's behalf, as CM has already spoken to [employer] but was informed the wheelchair was not picked up in 2023.     Follow up on DME  [employer] - got incontinence supplies, states there was less than usual.  Reports that she has also noticed less nutrition products this time (two boxes instead of 4), received 48 Boost    Patient's daughter states needs wound care supplies as HH is bringing supplies, but is not leaving any for other days/times. Also states wound clinic appointment needed. Per chart, [place] wound clinic referral was placed but the referral was canceled due to eligibility. Explained to patient's daughter that the referral was canceled as [facility] wound clinic can only see wounds on lower extremities (thigh and below). Patient's daughter expressed significant frustrations at this. Attempted to further explain that this particular clinic may not have the resources to treat these specific wounds and [facility] was recommended as an alternative. With further discussion, patient's daughter eventually amenable to referral to [facility] Wound clinic. CM routed referral to [facility] via fax at [phone].     Plan:  -Contact [employer]  -Check prescriptions for nutrition and incontinence as patient's daughter states current orders have less than expected  -[place] supplies, [employer]    Patient coming to appt next Tuesday - May 13th. Planning at that appointment to ask for more [employer] aide hours - states she need a letter that states it is very hard for daughter to be the caregiver with intermittent aide care, and there are safety concerns to lift and move her around as-is.    Contacted [employer] regarding wheelchair payment issue. States Sherian Dimitri not in the office, left VM for [place] requesting call back.     Additional Information/Plan:  Patient provided my direct contact information and encouraged to contact me should additional needs arise.    Time Spent Per Day:  Chart review was completed prior to outreach attempt.   05/07/2024: 35      Paulita Boss, MSW  [employer] FAMILY MEDICINE [place]

## 2024-05-11 ENCOUNTER — Ambulatory Visit: Admit: 2024-05-11 | Discharge: 2024-05-12 | Payer: MEDICARE

## 2024-05-11 DIAGNOSIS — F259 Schizoaffective disorder, unspecified: Principal | ICD-10-CM

## 2024-05-11 MED ADMIN — fluPHENAZine decanoate (PROLIXIN) injection 50 mg: 50 mg | INTRAMUSCULAR | @ 16:00:00

## 2024-05-11 NOTE — Unmapped (Signed)
 Administered long-acting injectable today. See MAR for additional documentation. Sharyl Nimrod, CMA

## 2024-05-11 NOTE — Unmapped (Signed)
 [facility] Home Health Order [phone] printed and placed in your box

## 2024-05-12 ENCOUNTER — Ambulatory Visit: Admit: 2024-05-12 | Discharge: 2024-05-13 | Payer: MEDICARE

## 2024-05-12 DIAGNOSIS — G603 Idiopathic progressive neuropathy: Principal | ICD-10-CM

## 2024-05-12 DIAGNOSIS — E1149 Type 2 diabetes mellitus with other diabetic neurological complication: Principal | ICD-10-CM

## 2024-05-12 DIAGNOSIS — G9589 Other specified diseases of spinal cord: Principal | ICD-10-CM

## 2024-05-12 DIAGNOSIS — I4891 Unspecified atrial fibrillation: Principal | ICD-10-CM

## 2024-05-12 DIAGNOSIS — F17200 Nicotine dependence, unspecified, uncomplicated: Principal | ICD-10-CM

## 2024-05-12 DIAGNOSIS — I10 Essential (primary) hypertension: Principal | ICD-10-CM

## 2024-05-12 DIAGNOSIS — L89152 Pressure ulcer of sacral region, stage 2: Principal | ICD-10-CM

## 2024-05-12 DIAGNOSIS — F259 Schizoaffective disorder, unspecified: Principal | ICD-10-CM

## 2024-05-12 DIAGNOSIS — I639 Cerebral infarction, unspecified: Principal | ICD-10-CM

## 2024-05-12 DIAGNOSIS — Z9889 Other specified postprocedural states: Principal | ICD-10-CM

## 2024-05-12 DIAGNOSIS — L8990 Pressure ulcer of unspecified site, unspecified stage: Principal | ICD-10-CM

## 2024-05-12 DIAGNOSIS — K56609 Unspecified intestinal obstruction, unspecified as to partial versus complete obstruction: Principal | ICD-10-CM

## 2024-05-12 DIAGNOSIS — S31109S Unspecified open wound of abdominal wall, unspecified quadrant without penetration into peritoneal cavity, sequela: Principal | ICD-10-CM

## 2024-05-12 MED ORDER — DOXYCYCLINE HYCLATE 100 MG TABLET
ORAL_TABLET | ORAL | 0 refills | 0.00000 days | Status: CP
Start: 2024-05-12 — End: ?

## 2024-05-12 NOTE — Unmapped (Signed)
 [facility] FAMILY MEDICINE [place] POPULATION HEALTH  Care Management Progress Note    Date: 05/12/2024  Outcome:  Phone outreach completed    Purpose of contact:           Contacted Paola Bohr from [facility] regarding updated HH orders that were requested. Would like orders via fax.    CM to fax orders - [phone]. Note: gauze to be changed to moisture absorbent bandage.    ---  Update 5/14 -  Note in chart from [facility] w/ [facility]. Order received but more information needed. Contacted [facility] at [phone].    Spoke with Paola Bohr - states these are not skillable, just an ointment and a dry dressing. If he needs help with a recommendation. Could do a wound consult through [facility]. They do video with the nurse that goes out. No orders needed, just a verbal OK from PCP is fine.  Followed up with PCP that wound consult is OK.    Spoke with Paola Bohr again to provide update. They will get the wound consult set up and in the meantime, will have RN continue to provide support until recommendations are put in place.     Additional Information/Plan:  Patient provided my direct contact information and encouraged to contact me should additional needs arise.    Time Spent Per Day:  Chart review was completed prior to outreach attempt.   05/12/2024: 20      Paulita Boss, MSW  [employer] FAMILY MEDICINE [place]

## 2024-05-12 NOTE — Unmapped (Signed)
 Copied from CRM #[id number]. Topic: Access To Clinicians - Order Question  >> May 12, 2024  3:18 PM Floreen Hunger wrote:  Arman Berlin from [facility] has a request regarding the following: [facility] is requesting [facility]: [facility] Rhonda W Dressing order.  Arman Berlin stated that patient was seen today and they received orders.  In order for skilled nursing to remain part of patient care,  skillable dressing for the two wound will need to be ordered.  Verbal order is also welcomed.     Arman Berlin Phone# - [phone] (secure line),    Fax# [phone]    Routine callback turnaround time: 24-48 business hours. Programmer, systems Notified)

## 2024-05-12 NOTE — Unmapped (Signed)
 Chief Complaint   Patient presents with    Follow-up     Hospitalization follow up.       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    Atrial fibrillation, unspecified type         Nervous and Auditory    Peripheral neuropathy       Other    Tobacco use disorder (Chronic)    Relevant Medications    doxycycline (VIBRA-TABS) 100 MG tablet    Schizoaffective disorder      S/P exploratory laparotomy     Other Visit Diagnoses         Wound, open, abdominal wall, anterior with complication, sequela    -  Primary    Relevant Orders    Ambulatory referral to Home Health      Pressure injury of sacral region, stage 2 (CMS-HCC)        Relevant Orders    Ambulatory referral to Home Health      Myelomalacia of cervical cord                  Sil[place]ene  Continue wet to dry  Counseled tobacco  Wound care orders give  Tylenol given  Follow with me in one month  Updated problem list annotations  Database review and update  Med review and update  Lab review and update  Counseling provided and patient education    65 minutes with patient- 1/2 in face to face counseling on chronic health conditions and  wounds    SUBJECTIVE:    Ms. Mooty is a 83 y.o. female that presents to clinic today regarding the following issues:    Follow up after prolonged hospitalization  Has had rehab at home  With daughter here  Darrell Else does not feel good  Swelling in ankle and pain in arm  Prolonged open wound haling  Picture shows eschar  No drainage  No infection  Being packed wet to dry  Had one wound nurse that came out  Also wound in buttocks area  Putting triple antibiotic cream on it    -02/26/24: Admitted to [facility] floor; urology consult for possible ileal conduit revision.   -02/27/24: Patient level of care escalated to step-down d/t increased oxygen needs   -02/28/24: Upgraded to ICU. Intubated at bed-side. OR for ex-lap, reduction of parastomal hernia, small bowel resection with primary anastomosis, lysis adhesions, abdomen left open with ABThera.   -03/01/24: OR for attempt at abdominal closure. Left open due to desaturations during closure attempt. ABThera in place.  -03/03/24: RTOR for abdominal closure  -03/06/24: Vas cath placed, CRRT started   -03/09/24: Extubated to HFNC  -03/10/24: CRRT discontinued   -03/11/24: Re-intubated for hypoxic respiratory failure 2/2 mucus plug   -03/14/24 Extubated to HFNC   -03/15/24: Vas cath removed   -03/16/24: Placed on BiPap for complete collapse of L lung 2/2 probable mucus plugging  -03/19/24: Re-intubated for mucus plugging. Therapeutic bronch, BAL sent.   -03/22/24: Extubated to HFNC  -03/27/24: Transferred to stepdown  -04/03/24: Transferred to floor  Secondary Diagnosis: Principal Problem:    SBO (small bowel obstruction) (  POA: Unknown)  Active Problems:    Tobacco use disorder (POA: Yes)  OR Procedures:    EXPLORATORY LAPAROTOMY, EXPLORATORY CELIOTOMY WITH OR WITHOUT BIOPSY(S)  02/28/2024  REOPENING OF RECENT LAPAROTOMY  02/29/2024     reports that she has been smoking cigarettes. She started smoking about 66 years ago. She has a 66.1 pack-year smoking history. She has never used smokeless tobacco.    The following portions of the patient's history were reviewed and updated as appropriate: allergies, current medications, past family history, past medical history, past social history, past surgical history and problem list.    Review of systems for 10 organ systems conducted and no significant positives      OBJECTIVE:    Vital signs have been reviewed:   Vitals:    05/12/24 1034   Temp: 36.5 ??C (97.7 ??F)     Vss  Abd soft  Extensive wound packing and eschar and healing abd without infection  See pics  Lungs cta  Cv rsr  Ext rle 1+ edema  Rectal area with stage 2 dcub and inc stool

## 2024-05-13 NOTE — Unmapped (Signed)
 Addended byAdrien Horner, Cheris Tweten on: 05/13/2024 01:26 PM     Modules accepted: Orders

## 2024-05-14 NOTE — Unmapped (Addendum)
 [facility] Home Health Order [id number] and [id number] printed and placed in your box

## 2024-05-14 NOTE — Unmapped (Signed)
 Faxed back.

## 2024-05-15 DIAGNOSIS — L8990 Pressure ulcer of unspecified site, unspecified stage: Principal | ICD-10-CM

## 2024-05-15 MED ADMIN — acetaminophen (TYLENOL) tablet 650 mg: 650 mg | ORAL | @ 19:00:00 | Stop: 2024-05-15

## 2024-05-15 NOTE — Unmapped (Signed)
 [facility] contacted in regards to their refill of Fluphenazine . At this time, they have declined refill due to [facility] advised due to pt's hospitalized and they currently have dose will follow-up in 1 month . Refill assessment call date has been updated per the clinic's request.

## 2024-05-15 NOTE — Unmapped (Signed)
 Copied from CRM #[id number]. Topic: Access To Clinicians - Req Clinic Call Back  >> May 15, 2024  3:23 PM [place] G wrote:  Reason of the Call: Asking for a call back    Requesting: A Call back regarding Wound Orders    Supporting details:patient has two wound, abdominal whom she needs to see a surgeon or wound clinic. Next wound if on her buttocks, HH needs orders because the one they currently have, the insurance wont cover the [facility] health nurse to come out and see her. HH asking for the buttock wound, calcium alginate and cover with a foam dressing    HH asking for a call back to assist the patient ASAP    Appointment: The patient was last seen on N/A.      [facility] preferred contact: [phone]  Urgent callback turnaround time: within 24 business hours. Programmer, systems Notified)

## 2024-05-15 NOTE — Unmapped (Signed)
 [facility] Home Health Order [id number] printed and placed in your box

## 2024-05-19 NOTE — Unmapped (Signed)
 Copied from CRM #[id number]. Topic: Other - Other  >> May 19, 2024  4:14 PM Lisette Ridgel wrote:  The PAC has received an incoming clinical call:    Caller name: Jock Muller callback number: [phone]  Relationship to Patient: hh nurse    Describe the reason for the call: Nurse called to inform pcp that her resting heart rate is 50 beats per minutes and she feel woozy.

## 2024-05-20 NOTE — Unmapped (Signed)
 Contact made with Jill. She reports, sx had improved before leaving. When she returns if sx are present, she will inform family to have patient seen for evaluation.

## 2024-05-22 DIAGNOSIS — I10 Essential (primary) hypertension: Principal | ICD-10-CM

## 2024-05-22 DIAGNOSIS — G603 Idiopathic progressive neuropathy: Principal | ICD-10-CM

## 2024-05-22 NOTE — Unmapped (Signed)
 Faxed back.

## 2024-05-22 NOTE — Unmapped (Signed)
 [facility] FAMILY MEDICINE [place] POPULATION HEALTH  Care Management Progress Note    Date: 05/22/2024  Outcome:  Phone outreach completed    Purpose of contact:           CM received message that family still in-need of referral to wound care clinic. Contacted [facility] at [phone] to inquire about status of referral placed 5/8 via fax to [phone]- [facility]. [facility] RN states they did not receive the referral. Attempted to confirm fax listed on website, and RN on the line stated that the fax number actually routes to foot surgery not wound care.    However, states that a referral is likely not needed and the patient can call [phone] to self-schedule. CM to share information with patient/family.     Attempted to contact patient's daughter at [phone] but mailbox full. Will send MyChart message.    --  05/22/24    Received call back from patient. Gave number for wound care clinic to self-schedule. States nurse recommended she needs to work with Dr. Adrien Horner and needs more information regarding the wound care.     Patient's daughter requested CM continue to contact [employer] regarding wheelchair. Expressed to patient's daughter that this may not have further utility in this case and recommended medicaid ombudsman for further problem-solving. Patient's daughter expressed frustration that it is a Teacher, early years/pre job to be the go-between and continue to express frustration that none of her social workers have been able to fix this problem. With further discussion, explained to patient's daughter that this was already discussed with both [employer] and [employer] (see notes 11/18/23-11/22/23) and both companies report preparing a wheelchair that was never picked up, and any further billing/coverage issues may need to be managed on the Medicaid-level.    With further discussion, was able to agree upon a collaborative plan of action- CM and patient can contact Medicaid ombudsman for further guidance and support.    Tuesday morning - will conference call with patient.     Additional Information/Plan:  Patient provided my direct contact information and encouraged to contact me should additional needs arise.    Time Spent Per Day:  Chart review was completed prior to outreach attempt.   05/22/2024: 15      Paulita Boss, MSW  [facility] FAMILY MEDICINE [place]    [phone]

## 2024-05-22 NOTE — Unmapped (Signed)
 Contacted patient's daughter as she was the caller. States the [facility] nurse is needing wound care orders, and they need a referral to wound care center. Stated the home health nurse [facility] stated she had sent recommendations to provider. [facility]. [phone]. Contacted nurse and got their needs/recommendations. She states the abdominal wound was never seen post op by the surgeon. They strongly recommend referral to wound clinic. For the patient sacral wounds, recommend wound cleanser followed by calcium alginate and covered with optifoam. Will message provider.

## 2024-05-22 NOTE — Unmapped (Signed)
 Copied from CRM [id number]. Topic: Referral - Referral Status  >> May 22, 2024  1:38 PM Zakiya C wrote:  Status Needed: They are inquiring about the status of the referral/order request for wound care  Caller stated they have made multiple attempts to get verbal orders for skillable orders. This may have been mixed up with the home health orders sent over (and was faxed back).    . Facility Request: [facility].   Coverage: yes, coverage is accurate on file.     Keagan with [facility] preferred contact: [phone] Urgent callback turnaround time: within 24 business hours. Programmer, systems Notified)

## 2024-05-22 NOTE — Unmapped (Signed)
 Copied from CRM #[id number]. Topic: Access To Clinicians - Req Clinic Call Back  >> May 22, 2024  1:31 PM Fabian Holster B wrote:  Clinical Advice: Monitoring Advice: Seeking guidance on how to monitor condition(s): care for the wound       They have been seen for this issue previously.  Patients daughter Stevens Eland  preferred contact: Cell Phone Routine callback turnaround time: 24-48 business hours. Programmer, systems Notified)

## 2024-05-26 ENCOUNTER — Ambulatory Visit
Admit: 2024-05-26 | Discharge: 2024-05-27 | Payer: MEDICARE | Attending: Student in an Organized Health Care Education/Training Program | Primary: Student in an Organized Health Care Education/Training Program

## 2024-05-26 NOTE — Unmapped (Signed)
 [facility]  Pulmonary Clinic - Follow up Visit    Referring Physician :  Loreen Roers  PCP:     Loreen Roers, MD  Reason for Consult:   COPD     ASSESSMENT and PLAN     Tammy Braun is a 83 y.o. female with COPD and restrictive lung physiology.     Tammy Braun presented today to for follow up. She has evidence of pretty severe COPD per her imaging, though doesn't have obstruction on her PFTs. Fortunately, she has limited symptoms though these may due to her low mobility due to her other medical conditions. We discussed at length today the natural progression and treatment of COPD. She fortunately is no longer smoking and I congratulated her on this and encouraged continued cessation. Also discussed benefit of immunizations, but they prefer to read about these further before proceeding.  She has minimal symptoms, but offered inhaler therapy which she is not interested at this time which is reasonable.     She has some evidence of possible restriction on her PFTs (think less likely air trapping given normal FEV1/FVC ratio though may be some pseudo-normalization). This is likely in the setting of her large hernia. No evidence of ILD on her imaging.     Plan:   Inhalers:  Continue PRN albuterol  Encouraged continued smoking cessation    Consider pulmonary rehab in future   Continue to discuss immunizations at future visit.     Plan of care was discussed with the patient who acknowledged understanding and is in agreement.    Patient will follow up in 6 months or sooner if needed.    Ms. Shiller was seen, examined and discussed with Dr. Arvid Birk  who agrees with the assessment and plan above.     ZO:XWRU Lazaro Prime, Loreen Roers, MD      Marlise Simpers  Pulmonary and Critical Care Fellow PGY4        HISTORY:      History of Present Illness: Tammy Braun is a 83 y.o. female current 1 pack/day smoker (97.5 pack year) with PMHx HTN, PVC, CAD, TR, chart hx of pulm HTN, stroke, COPD, DM, schizoaffective d/o who is seen in consultation at the request of Loreen Roers,* for comprehensive evaluation of COPD.     Had been seen in pulmonary HTN clinic in 2021. Was referred at that time given echo c/f pulm HTN. Noted to have COPD, and was taking Spiriva. Was smoking currently at that time. Determined to have group 2 and group 3 in setting of COPD.     She states she had cough and some shortness of breath with exertion for many, many years. Denies any hemoptysis. She current takes albuterol PRN, about every other day, mostly for productive cough. Not on any maintenance inhalers. She denies any wheezing. However, her daughter, who was present in the room said her breathing is very audible when she walks. Her ambulation is limited by her significant OA, so uncertain if SOB would be limiting. Denies orthopnea and PND. Cough not worse at night, worse during the day. Is currently smoking 1 ppd. Reduced from 1.5 ppd. Has been smoking since she was 83 years old.    OSH records and referral documentation reviewed.     Interval Hx:  Last seen by me in 09/2023. Since then, admitted to OSH with sepsis from likely pulmonary and urinary sources in 12/2023. Treated with IV Abx. Dx  with afib w/ RVR. Declined anticoagulation at that time. Admitted to [facility] for small bowel obstruction. However, developed respiratory failure and required intubation. Underwent X-lap and left abdomen open with re-closure a few days later.  Had to be re-intubated x 2 due to respiratory failure for mucous plugging. Found to have PE as well so started on anticoagulation. Today, she is feeling well from a pulmonary standpoint. Her mobility is still limited, but not having any pulmonary symptoms at her current activity level.  She and her family had many questions about COPD, its natural progression, and treatment options.         Review of Systems:  A comprehensive review of systems was completed and negative except as noted in HPI.    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    Chronic kidney disease     Depression, psychotic       Diabetes mellitus       in past    Emphysema of lung       Financial difficulties     Frail elderly     Hearing impairment     Hernia     History of transfusion     Hyperlipidemia     Hypertension     Impaired mobility     Osteoporosis     Pulmonary emphysema   05/08/2015    Visual impairment        PSHx:  Past Surgical History:   Procedure Laterality Date    ABDOMINAL SURGERY      BLADDER SURGERY      BREAST CYST EXCISION      CHEMOTHERAPY  2012    bladder    GALLBLADDER SURGERY      stone removal    ILEOSTOMY  2012    PR COLONOSCOPY FLX DX W/COLLJ SPEC WHEN PFRMD N/A 02/09/2015    Procedure: COLONOSCOPY, FLEXIBLE, PROXIMAL TO SPLENIC FLEXURE; DIAGNOSTIC, W/WO COLLECTION SPECIMEN BY BRUSH OR WASH;  Surgeon: Lara Plants, MD;  Location: [facility] ;  Service: Gastroenterology    PR EXPLORATORY OF ABDOMEN N/A 02/28/2024    Procedure: EXPLORATORY LAPAROTOMY, EXPLORATORY CELIOTOMY WITH OR WITHOUT BIOPSY(S);  Surgeon: Hall Levering, MD;  Location: [facility];  Service: General Surgery    PR RECONSTR TOTAL SHOULDER IMPLANT Left 04/08/2018    Procedure: ARTHROPLASTY, GLENOHUMERAL JOINT; TOTAL SHOULDER(GLENOID & PROXIMAL HUMERAL REPLACEMENT(EG, TOTAL SHOULDER);  Surgeon: Marget Sheffield, MD;  Location: [facility] OR [facility];  Service: Ortho Sports Medicine    PR REOPEN RECENT ABD EXPLORATORY Midline 03/01/2024    Procedure: REOPENING OF RECENT LAPAROTOMY;  Surgeon: Sheralyn Dies., MD;  Location: OR [facility];  Service: Trauma    PR REOPEN RECENT ABD EXPLORATORY N/A 03/03/2024    Procedure: REOPENING OF RECENT LAPAROTOMY;  Surgeon: Andrey Bank, MD;  Location: OR [facility];  Service: Trauma    PR SIGMOIDOSCOPY,BIOPSY N/A 03/11/2015    Procedure: SIGMOIDOSCOPY, FLEXIBLE; WITH BIOPSY, SINGLE OR MULTIPLE;  Surgeon: Larissa Plowman, MD;  [facility];  Service: Gastroenterology    PR XCAPSL CTRC RMVL INSJ IO LENS PROSTH W/O ECP Left 06/05/2022    Procedure: EXTRACAPSULAR CATARACT REMOVAL W/INSERTION OF INTRAOCULAR LENS PROSTHESIS, MANUAL OR  MECHANICAL TECHNIQUE WITHOUT ENDOSCOPIC CYCLOPHOTOCOAGULATION;  Surgeon: Sinthu Sivarama Ranjan, MD;  Location: [facility] OR [facility];  Service: Ophthalmology    PR XCAPSL CTRC RMVL INSJ IO LENS PROSTH W/O ECP Right 06/19/2022    Procedure: EXTRACAPSULAR CATARACT REMOVAL W/INSERTION OF INTRAOCULAR LENS PROSTHESIS, MANUAL OR MECHANICAL TECHNIQUE WITHOUT ENDOSCOPIC CYCLOPHOTOCOAGULATION;  Surgeon: Sinthu Sivarama Ranjan, MD;  Location: [facility] OR [facility];  Service: Ophthalmology        FHx:  Family History   Problem Relation Age of Onset    Breast cancer Daughter 75    Diabetes Mother     Glaucoma Father     Colon cancer Neg Hx     Ovarian cancer Neg Hx     Endometrial cancer Neg Hx     Anesthesia problems Neg Hx     Bleeding Disorder Neg Hx         Social   Lives with daughter    Smoking (cigarettes, vaping, marijuana): Current smoker, 1 ppd, reduced from 1.5 ppd. Started smoking at 17. 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     Exposures:   - asbestos: None   - silica: None  - ceramics: None    - metals: None   - dusts: None   - molds: None   - hot tub/sauna/flooding: None     Pets: A dog. No birds.     Medications:     Current Outpatient Medications:     acetaminophen (TYLENOL) 500 MG tablet, Take 1 tablet (500 mg total) by mouth every six (6) hours., Disp: 30 tablet, Rfl: 0    albuterol HFA 90 mcg/actuation inhaler, Inhale 2 puffs every four (4) hours as needed for wheezing., Disp: 18 g, Rfl: 11    apixaban (ELIQUIS) 5 mg Tab, Take 1 tablet (5 mg total) by mouth two (2) times a day., Disp: 60 tablet, Rfl: 2    atorvastatin (LIPITOR) 40 MG tablet, Take 1 tablet (40 mg total) by mouth daily., Disp: 90 tablet, Rfl: 3    blood sugar diagnostic (GLUCOSE BLOOD) Strp, Disp test strips preferred by insurance plan. Testing qday, Dx: E11.9 (Type 2 DM- controlled), Disp: 30 strip, Rfl: 11    blood-glucose meter kit, Disp. blood glucose meter kit preferred by patient's insurance. Check blood sugars as directed by provider. Dx: Diabetes, E11.9, Disp: 1 each, Rfl: 1    cyclobenzaprine (FLEXERIL) 5 MG tablet, Take 1 tablet (5 mg total) by mouth two (2) times a day as needed for muscle spasms., Disp: 60 tablet, Rfl: 5    dilTIAZem (CARDIZEM CD) 120 MG 24 hr capsule, Take 1 capsule (120 mg total) by mouth daily., Disp: 30 capsule, Rfl: 11    doxycycline (VIBRA-TABS) 100 MG tablet, Bid for 7 days, Disp: 14 tablet, Rfl: 0    fluPHENAZine (PROLIXIN) 1 MG tablet, Take 1 tablet by mouth daily as needed for auditory hallucinations. If symptoms persist after 1 tablet, you may take a second tablet the same day. Do not exceed 2 tablets in one day., Disp: 60 tablet, Rfl: 0    fluPHENAZine decanoate (PROLIXIN) 25 mg/mL injection, Inject 2 mL (50 mg total) into the muscle every thirty (30) days., Disp: 5 mL, Rfl: 11    furosemide (LASIX) 20  MG tablet, Take 1 tablet (20 mg total) by mouth daily., Disp: 30 tablet, Rfl: 5    gabapentin (NEURONTIN) 100 MG capsule, Take 1 capsule (100 mg total) by mouth Three (3) times a day., Disp: 90 capsule, Rfl: 2    inhalational spacing device (E-Z SPACER) Spcr, 1 each by Miscellaneous route every four (4) hours as needed., Disp: 1 each, Rfl: 0    lancets Misc, Disp. lancets #30 or amount allowed, Testing once Qday. Dx: E11.9 (Diabetes- controlled), Disp: 30 each, Rfl: 11    MEDICAL SUPPLY ITEM, Entreal formula nutritionally supplement complete caloric density, Disp: 3 application, Rfl: 3    MEDICAL SUPPLY ITEM, Compression stockings knee high, Disp: 2 each, Rfl: 0    miscellaneous medical supply Misc, 1 each by Miscellaneous route four (4) times a day., Disp: 100 each, Rfl: 0    trolamine salicylate (ASPERCREME) 10 % cream, Apply topically two (2) times a day., Disp: 100 g, Rfl: 0    underpads 2.6 X 2.9 feet Pads, 1 each by Miscellaneous route two (2) times a day., Disp: 100 each, Rfl: 3    Current Facility-Administered Medications:     acetaminophen (TYLENOL) solution 650 mg, 650 mg, Oral, Once,     fluPHENAZine decanoate (PROLIXIN) injection 50 mg, 50 mg, Intramuscular, Q30 Days, , 50 mg at 05/11/24 1226     Allergies: Reviewed.    Other History:  The past medical history, surgical history, social history, family history, medications and allergies were personally reviewed and updated in the patient's electronic medical record.           PHYSICAL EXAM:      Physical Exam:  Vitals:    05/27/24 1446 05/27/24 1502   BP: 169/86 159/85   BP Site: L Arm L Arm   BP Position: Sitting Sitting   BP Cuff Size: Medium Medium   Pulse: 63 63   Temp: 36.2 ??C (97.1 ??F)    TempSrc: Temporal    SpO2: 98%    Weight: 61.2 kg (135 lb)    Height: 149.9 cm (4' 11)        Body mass index is 27.27 kg/m??.    General: well appearing, appears stated age, sitting in wheel chair, Non-toxic. NAD.    Eyes: EOMI grossly. Anicteric sclera. Aaron Aas  Respiratory: CTAB, no wheezes, rales, or rhonchi   Cardiovascular: RRR, +s1/s2, no m/r/g. trace LEE R>L.   Musculoskeletal/extremities: moving all extremities   Skin: No skin rashes on clothed exam  Neuro: Alert and oriented x 3. CN grossly intact. No focal neurological deficits grossly        LABORATORY and RADIOLOGY DATA:     Pulmonary Function Tests/Interpretation:     Pulmonary Function Test Results    : None      Pertinent Laboratory Data:  Reviewed        Pertinent Imaging Data:  Images were personally reviewed with attending.     Modified Barium Swallow (03/26/24):   Study is limited due to technical difficulties majority of fluoroscopic cine clips associated with the exam were not recorded.      Within these limitations, laryngeal penetration occurred with thin liquids. No evidence of aspiration with thin liquids.      Please see full speech and language pathology note for further evaluation.     CT Chest (03/20/24):   --Near complete resolution of previously noted groundglass opacities and consolidation, with mild persistent consolidation within the lingula and right lower lobe.      --  Unchanged moderate bilateral pleural effusions with associate near complete collapse of bilateral lower lobes.      --Unchanged large hiatal hernia.              TTE (03/11/2024):  1. The left ventricular systolic function is hyperdynamic, LVEF is visually  estimated at 70%.    2. The left atrium is dilated in size.    3. The right ventricle is moderately dilated in size, with moderately  reduced systolic function.    4. There is severe pulmonary hypertension.    5. TR maximum velocity: 4.6 m/s  Estimated PASP: 96 mmHg.    6. The right atrium is dilated in size.    CT Chest (02/2024):  Findings are suggestive of multifocal aspiration pneumonia.      Unchanged small bilateral pleural effusions and complete collapse of the bilateral lower lobes.     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      New complete collapse of the right middle and lower lobes and left lower lobe with extensive endobronchial secretions concerning for superimposed aspiration pneumonia.      Diffuse bronchial wall thickening suggestive of infectious or inflammatory bronchitis.      Small right-sided and trace left-sided pleural effusions.     CTA Chest (03/03/24):  1.  Filling defects visualized in the distal left anterior segmental artery are favored to represent acute pulmonary embolus.   2.  Enlarged right ventricle and dilated main pulmonary artery, favored chronic in the setting of pulmonary arterial hypertension. There may be mild right heart strain. Recommend echocardiographic for better characterization.   3.  Redemonstrated large Bochdalek hernia extending into the right greater than left hemithorax with complete collapse of bilateral lower lobes.   4.  Small right and trace left pleural effusions. Small volume subcutaneous emphysema overlying the lower anterior chest wall.      The findings of this study were discussed via telephone with DR. NARAYAN RAGHAVA by Dr. Antoniette Batty on 03/03/2024 11:59 PM     TTE (03/03/24):  Summary    1. The left ventricular systolic function is normal, LVEF is visually  estimated at 65-70%.    2. Mitral annular calcification is present (moderate).    3. The right ventricle is moderately dilated in size, with moderately  reduced systolic function.    4. Possible McConnell sign is noted- see details below.    5. There is massive tricuspid regurgitation.    6. There is mild-moderate pulmonary hypertension.    7. TR maximum velocity: 3.2 m/s  Estimated PASP: 49 mmHg.    8. The right atrium is moderately dilated in size.    TTE (02/2024):  1. The left ventricle is normal in size with normal wall thickness.    2. The left ventricular systolic function is normal, LVEF is visually  estimated at > 55%.    3. The right ventricle is mildly to moderately dilated in size, with mildly  reduced systolic function.    4. There is severe tricuspid regurgitation.    5. There is at least moderate pulmonary hypertension, peak velocity may  underestimate RVSP due to severe TR.    6. The right atrium is moderately to severely dilated in size.    CT Chest w/o Contrat (05/2023):   1. As evident on prior imaging, there is extensive volume in the lung bases associated with mass effect from herniation of a large amount of abdominal contents into the  caudal aspect of the mediastinum. However, this study demonstrates new complete lack of aeration of the lower lobe of the left lung which likely combination of atelectasis and consolidation. Associated extensive debris within the segmental airways may relate to infection, retained mucus, or aspiration. I favor the latter. No potentially associated obstructing mass lesion is identified. However, consider CT imaging surveillance in 2 to 3 months to ensure improvement.      2. Notable chronic findings include:   - Calcified atherosclerotic disease within the coronary arterial distribution (heavy)   - Advanced destructive centrilobular and substantial paraseptal emphysema, similar to prior.   -Prominent pulmonary pulmonary trunk (3. 7 cm), which may reflect some element of pulmonary arterial hypertension          Echo (12/2022):    1. The left ventricle is normal in size with mildly increased wall  thickness.    2. The left ventricular systolic function is normal, LVEF is visually  estimated at > 55%.    3. The mitral valve leaflets are mildly thickened with normal leaflet  mobility.    4. Mitral annular calcification is present (moderate).    5. There is mild mitral valve regurgitation.    6. The aortic valve is trileaflet with mildly thickened leaflets with normal  excursion.    7. There is mild aortic regurgitation.    8. The right ventricle is normal in size, with normal systolic function.    9. There is moderate to severe tricuspid regurgitation.    10. There is moderate pulmonary hypertension.    11. There is mild pulmonic regurgitation

## 2024-05-26 NOTE — Unmapped (Signed)
 [facility]  [address] Tammy Braun [address] [place], [place] [address]    [phone]    Date: 05/26/2024    Purpose of Contact:   Patient has an active referral to [facility] for a power wheelchair. However, according to [employer] ([employer]) is a Transport planner (MAC) for the [employer] for [employer] (CMS), a claims history summary was pulled and reflects that [employer] billed for a power wheelchair in December 2023. This therapist was able to contact patient's daughter, Tammy Braun, to inquire more information regarding this power wheelchair.     Upon discussing this with the patient's daughter Tammy Braun on 05/26/2024 at approximately 11:35 am, daughter was able to report that in December 2023, an attempt was made to pursue a power wheelchair. A representative from [facility] (unable to recall a name) was able to bring a wheelchair to the house but the wheelchair was too large and the patient's daughter did not like the demo wheelchair that was brought to the house.     Due to this, the patient went to [employer] to look at other wheelchairs. All the wheelchairs available at the store were either too small and did not have adequately sized wheels to navigate the terrain around the house. According to the daughter, the owner Tammy Braun, noted that a new wheelchair can be ordered for Tammy Braun. However, the patient was not contacted about this new wheelchair at that was ordered and thus never received notification that it could be picked up. This power wheelchair that was billed by [employer] was never delivered to the patient.     This issue was brought to the patient and daughter's attention due to a recent hospitalization in April where a rental manual wheelchair could not be ordered via [employer] in [place] due to the presence of this power wheelchair on the claims summary.      This therapist encouraged the patient's daughter Tammy Braun to go back to [employer] with this claims history summary below to show that a power wheelchair was billed. If [employer] is able to produce a wheelchair for Tammy Braun, the wheelchair referral can be cancelled since it is no longer needed. If [employer] is unable or refuses to assist the patient in resolving this error, then the patient and family will have to dispute the claim.     This therapist also spoke with Tammy Braun, MSW to update her on these findings and to summarize discussion with patient's daughter.     Thank you,   Genevia Kern      Signed: Isaias March, PT  05/26/2024 12:04 PM      Claims summary below:

## 2024-05-26 NOTE — Unmapped (Signed)
 [facility] [place] POPULATION HEALTH  Care Management Progress Note    Date: 05/26/2024  Outcome:  Phone outreach completed    Purpose of contact:           Contacted patient's daughter as agreed upon 5/23 to contact Medicaid ombudsman for support and next steps regarding wheelchair. Patient's daughter shared she would like to try to contact [place] [facility] [employer] again regarding billing issues that state a wheelchair was paid for, although it was not picked up. Stated that they had last left off with the agreement that [employer] would look for billing records from 2023.    Patient's daughter stated she would like to contact them one more time before turning to [facility] for problem-solving in case it can be resolved more easily before making a complaint. CM encouraged patient's daughter that it is possible there is an error somewhere, either from [place] or the supplier, and ideally this can be resolved soon. Patient's daughter would like to call [employer] again and will call CM back later. Encouraged daughter to call back when ready and leave VM if CM unable to answer.    Note: PWC evaluation is scheduled for 6/26 at 12:30pm - Supplier listed on appt as [employer].    Update 5/28 12pm - Spoke with [facility] clinic physical therapist Myrtie Atkinson regarding referral for a wheelchair evaluation. States they were able to pull medicare's claims billing summary and have encouraged patient's daughter to utilize this documentation when speaking with [employer], as this will impact her ability to receive a wheelchair as this summary shows that a PWC was billed for in 2023. CM affirmed that if this continues to be unresolved, CM can assist patient in navigating escalating to Medicare. Will continue to be available to patient's daughter.     Additional Information/Plan:  Patient provided my direct contact information and encouraged to contact me should additional needs arise.    Time Spent Per Day:  Chart review was completed prior to outreach attempt.   05/26/2024: 15      Paulita Boss, MSW  [employer] [facility] [place]

## 2024-05-26 NOTE — Unmapped (Signed)
 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  - The importance of tight glucose control and blood pressure control was discussed with the patient       # Refractive error OU  - pt broke glasses troube refracting due to patient positioning, seeing well in loose lenses of prior Mrx  - Mrx provided

## 2024-05-27 ENCOUNTER — Ambulatory Visit
Admit: 2024-05-27 | Discharge: 2024-05-27 | Payer: MEDICARE | Attending: Student in an Organized Health Care Education/Training Program | Primary: Student in an Organized Health Care Education/Training Program

## 2024-05-27 ENCOUNTER — Inpatient Hospital Stay: Admit: 2024-05-27 | Discharge: 2024-05-27 | Payer: MEDICARE

## 2024-05-27 DIAGNOSIS — J449 Chronic obstructive pulmonary disease, unspecified: Principal | ICD-10-CM

## 2024-05-27 DIAGNOSIS — J984 Other disorders of lung: Principal | ICD-10-CM

## 2024-05-27 NOTE — Unmapped (Signed)
 It was a pleasure see you in clinic today! You were seen in clinic today by Dr.  Felipe Horton & Dr.  Arvid Birk       Here's a summary of what we discussed during your visit:  Continue your albuterol as needed   I encourage you to think about getting some vaccines to protect your lungs. I would recommend the pneumonia vaccine, the RSV vaccine, and the Tdap vaccines at this time.       Follow up with me in 6 months!    Chrissy Ealey, MD   Pulmonary & Critical Care Fellow  [facility]  Phone: [phone]  Fax: [phone]    To contact your care team, you can either send a message via MyChart or contact the clinic at [phone].    How do I request medication refills?  Request a refill via [facility] (patient portal), call clinic at [phone] or have your pharmacy fax the request to [phone].    [facility]: [phone] *Pharmacy can mail medications to your home. You must call to request the medication be mailed.[facility]: [phone]  [facility]: [phone]    Here are some things you should know about contacting the [facility]:  Please be advised Epic now releases test results to MyChart as soon as they are available which means you will see your test results before I do.    The best way to reach your doctor for non-urgent matters is through MyChart. I can usually respond within two to three business day but I do not check messages after hours (evenings, weekends, and holidays) and often have other duties (inpatient hospital work, Producer, television/film/video activities, teaching) that make me unavailable.    - If you have sent a MyChart message to the clinic and have not received a response after three business days, please call our clinic at [phone] to speak to a nurse.     If you have an urgent issue that you feel needs a response the same day, you should also contact your primary care provider or be evaluated at an Urgent Care clinic.    For urgent lung issues after hours, you can call the hospital operator [phone]) and ask for the Pulmonary Fellow On Call. This doctor can provide some guidance and will send a message to your regular lung doctor the next morning.    If there is an emergency, call 911 or go to your closest emergency room.    I don't have a MyChart. Why should I get one?   - It's encrypted, so your information is secure  - It's a quick, easy way to contact the care team, manage appointments, see test results, and more!    How do I sign-up for MyChart?   - Download the MyChart app from the [employer] or [employer] and sign-up in the app  - Sign-up online at [email or web address]

## 2024-05-28 NOTE — Unmapped (Cosign Needed)
 [facility] [place] POPULATION HEALTH  Care Management Progress Note    Date: 05/28/2024  Outcome:  Outreach attempt to follow up, mailbox full, unable to leave message.    Purpose of contact:           Contacted patient's daughter Stevens Eland to follow up. States she did receive documentation from the wheelchair clinic and plans to call [facility] after 12:30pm to further discuss this issue. She reported she is hoping they can get her a wheelchair, but if not, will need to escalate to medicare    Wound nurse coming out and needs orders from Dr. Adrien Horner. Stated they need orders as wound is currently open - needs more than cream and bandage.     Wound RN Bridgette Campus, [phone]. Contacted PCP via Epic chat to request orders are sent verbally or via fax.    Update 05/29/24 - Staff message sent to PCP and RN to follow-up    Updated 6/2 - orders have been provided by RN per chart.     Additional Information/Plan:  Patient provided my direct contact information and encouraged to contact me should additional needs arise.    Time Spent Per Day:  Chart review was completed prior to outreach attempt.   05/28/2024: 15      Paulita Boss, MSW  [facility] [place]

## 2024-05-28 NOTE — Unmapped (Signed)
 [facility] Administered Medication Refill Coordination Note      NAME:Tammy Braun DOB: 25-Dec-1941      Medication: fluphenazine decanoate    Day Supply: 30 days       SHIPPING      Next delivery from [facility] [phone]) to Adult [facility] Clinic for Tammy Braun is scheduled for 06/05    Clinic contact: Lowella Ruder     Patient's next nurse visit for administration: 06/09    We will follow up with clinic monthly for standard refill processing and delivery.      [name] [name]  [facility]

## 2024-05-28 NOTE — Unmapped (Signed)
 Copied from CRM [id number]. Topic: Access To Clinicians - Req Clinic Call Back  >> May 28, 2024  1:20 PM Larey Plenty wrote:  Reason of the Call: Inform the PCP that the pt is not taking her apixaban (ELIQUIS) 5 mg Tab [[id number]] as she should be and they sent a fax over today with more details about their concerns.       Call back if have questions.       Deadra Everts from [facility] [place] preferred contact: [phone]  Routine callback turnaround time: 24-48 business hours. Programmer, systems Notified)

## 2024-06-01 NOTE — Unmapped (Signed)
 Referrals/Orders Related: New Request:Jennifer, [facility],   is requesting a verbal authorization for patient to receive home health aide/nursing duration of Undefined..     Reason for Orders: Patient recently had a operation on abdomen but did not return for the wound care and is needing further assist with the wound on their abdomen. The wound needs to be debridement. Caller states the patient will either need to be seen in the clinic or have orders placed so [facility] can assist and perform the debridement.    Patient is also needing care for their wound on their bottom. Caller is wanting to order calcium alginate to cover the bottom wound and to have it covered with opti-foam dressing. Currently insurance won't pay for these materials or services to have it completed because the orders were for wet to dry but the caller fears if it is not changed to the calcium alginate and the opti-foam dressing that the patient may turn septic. Caller requested this be escalated as it has been over a week and they are concerned for the patients wound and care.     Order(s):  Facility Name [facility]     Coverage: yes, coverage is accurate on file.    Preferred Contact: [phone]    Urgent callback turnaround time: within 24 business hours. Programmer, systems Notified)

## 2024-06-01 NOTE — Unmapped (Signed)
 Provided verbal order for authorization for dressing changes to buttocks with calcium alginate and cover with optiform gauze, clean with H2O or wound cleanser. Abdomen wound medihoney to wound bed and cover with optiform gauze cleanse with H2O or wound cleanser.

## 2024-06-02 DIAGNOSIS — S31109S Unspecified open wound of abdominal wall, unspecified quadrant without penetration into peritoneal cavity, sequela: Principal | ICD-10-CM

## 2024-06-02 NOTE — Unmapped (Signed)
 Copied from CRM #[id number]. Topic: Access To Clinicians - Req Clinic Call Back  >> Jun 02, 2024 11:28 AM Larey Plenty wrote:  Clinical Advice:  Requesting call back to go over details of a referral no other details were given when asked.       They have been seen for this issue previously.  The guardian, Stevens Eland,  preferred contact: [phone]   Routine callback turnaround time: 24-48 business hours. Programmer, systems Notified)

## 2024-06-02 NOTE — Unmapped (Signed)
 mailbox full-schedule w/Dr Bevin Bucks June 27th at 11:15

## 2024-06-02 NOTE — Unmapped (Signed)
 You have a fax in your box requiring your signature.

## 2024-06-02 NOTE — Unmapped (Signed)
 Daughter Stevens Eland would like a referral to the [facility] fax [phone], clinic line [phone]

## 2024-06-02 NOTE — Unmapped (Signed)
 The form faxed -06/02/24  [facility]  Fax#[phone]

## 2024-06-02 NOTE — Unmapped (Signed)
 Referral faxed 06/02/2024  [facility]   Fx#[phone]

## 2024-06-03 ENCOUNTER — Emergency Department: Admit: 2024-06-03 | Discharge: 2024-06-04 | Disposition: A | Discharge status: Home / Self Care | Payer: MEDICARE

## 2024-06-03 DIAGNOSIS — I10 Essential (primary) hypertension: Principal | ICD-10-CM

## 2024-06-03 LAB — URINALYSIS WITH MICROSCOPY
BILIRUBIN UA: NEGATIVE
BLOOD UA: NEGATIVE
GLUCOSE UA: NEGATIVE
KETONES UA: NEGATIVE
NITRITE UA: NEGATIVE
PH UA: 6.5 (ref 5.0–9.0)
PROTEIN UA: 30 — AB
RBC UA: <1 /HPF (ref <=4)
SPECIFIC GRAVITY UA: 1.004 (ref 1.003–1.030)
SQUAMOUS EPITHELIAL: 1 /HPF (ref 0–5)
UROBILINOGEN UA: <2
WBC UA: 9 /HPF — ABNORMAL HIGH (ref 0–5)

## 2024-06-03 LAB — COMPREHENSIVE METABOLIC PANEL
ALBUMIN: 3.3 g/dL — ABNORMAL LOW (ref 3.4–5.0)
ALKALINE PHOSPHATASE: 94 U/L (ref 46–116)
ALT (SGPT): 13 U/L (ref 10–49)
ANION GAP: 11 mmol/L (ref 5–14)
AST (SGOT): 16 U/L (ref <=34)
BILIRUBIN TOTAL: 0.4 mg/dL (ref 0.3–1.2)
BLOOD UREA NITROGEN: 12 mg/dL (ref 9–23)
BUN / CREAT RATIO: 14
CALCIUM: 9.7 mg/dL (ref 8.7–10.4)
CHLORIDE: 100 mmol/L (ref 98–107)
CO2: 25 mmol/L (ref 20.0–31.0)
CREATININE: 0.87 mg/dL (ref 0.55–1.02)
EGFR CKD-EPI (2021) FEMALE: 67 mL/min/1.73m2 (ref >=60)
GLUCOSE RANDOM: 155 mg/dL (ref 70–179)
POTASSIUM: 4.5 mmol/L (ref 3.4–4.8)
PROTEIN TOTAL: 8.1 g/dL (ref 5.7–8.2)
SODIUM: 136 mmol/L (ref 135–145)

## 2024-06-03 LAB — CBC W/ AUTO DIFF
BASOPHILS ABSOLUTE COUNT: 0 10*9/L (ref 0.0–0.1)
BASOPHILS RELATIVE PERCENT: 0.7 %
EOSINOPHILS ABSOLUTE COUNT: 0.1 10*9/L (ref 0.0–0.5)
EOSINOPHILS RELATIVE PERCENT: 2 %
HEMATOCRIT: 38.4 % (ref 34.0–44.0)
HEMOGLOBIN: 12.6 g/dL (ref 11.3–14.9)
LYMPHOCYTES ABSOLUTE COUNT: 1.5 10*9/L (ref 1.1–3.6)
LYMPHOCYTES RELATIVE PERCENT: 23.2 %
MEAN CORPUSCULAR HEMOGLOBIN CONC: 32.9 g/dL (ref 32.0–36.0)
MEAN CORPUSCULAR HEMOGLOBIN: 27.7 pg (ref 25.9–32.4)
MEAN CORPUSCULAR VOLUME: 84.2 fL (ref 77.6–95.7)
MEAN PLATELET VOLUME: 7.2 fL (ref 6.8–10.7)
MONOCYTES ABSOLUTE COUNT: 0.6 10*9/L (ref 0.3–0.8)
MONOCYTES RELATIVE PERCENT: 9.3 %
NEUTROPHILS ABSOLUTE COUNT: 4.1 10*9/L (ref 1.8–7.8)
NEUTROPHILS RELATIVE PERCENT: 64.8 %
PLATELET COUNT: 201 10*9/L (ref 150–450)
RED BLOOD CELL COUNT: 4.57 10*12/L (ref 3.95–5.13)
RED CELL DISTRIBUTION WIDTH: 18.5 % — ABNORMAL HIGH (ref 12.2–15.2)
WBC ADJUSTED: 6.3 10*9/L (ref 3.6–11.2)

## 2024-06-03 LAB — MAGNESIUM: MAGNESIUM: 1.8 mg/dL (ref 1.6–2.6)

## 2024-06-03 NOTE — Unmapped (Signed)
.  Patient rounding complete, call bell in reach, bed locked and in lowest position, patient belongings at bedside and within reach of patient.  Patient updated on plan of care.      Pt assistance to go to bathroom . Pt have a large stools.   Pt unable to standing up herself.   Pt needed 2 staff assistance to bathroom.   Pt need from wheelchair to the bed needed three people assistance laying on the bed.    Pt paced on cardiac monitor, continuous pulse oximeter and continuous blood pressure monitoring.     ED Med student at the bedside with pt.    Pt family at the bedside with pt.

## 2024-06-03 NOTE — Unmapped (Signed)
 Emergency Department Provider Note        ED Clinical Impression     Final diagnoses:   Hypertension, unspecified type (Primary)       Medical Decision Making     Medical Decision Making  Mrs. Rossi is 83 y.o. female with PMHx remarkable for bladder cancer (s/p ileal conduit 2012), SBO (s/p resection 02/28/2024 with subsequent admission for hypoxic respiratory failure), COPD (no O2 requirement at baseline), HTN, HLD, T2DM, and CKD who presents to ED for recorded SBP to 210s this morning.     While in ED, patient had BEs to the 170s/80s. All other VSS. Cardiac and lung exam unremarkable. Patient has two open wounds at site of previous midline incision for SBO resection but notably without purulence or drainage. EKG normal sinus rhythm with occasional PVCs. CBC, CMP, and Mg within normal limits. UA remarkable for occasional bacteria, moderate leukocyte esterase, 9 WBCs, and 30 protein.     Differential diagnosis includes hypertensive emergency, stroke, acute infection, ACS, AKI, and asymptomatic hypertension. Patient denies any symptoms of end-organ dysfunction including headache, visual changes, chest pain, or neurological changes. Given lack of symptoms and patient's stable vital signs, this patient does not meet criteria for hypertensive emergency. ACS less likely as EKG not remarkable for any signs of ischemia, normal cardiac exam, and lack of chest pain or shortness of breath. Patient does have chronic open wounds at site of previous incision for SBO resection in 02/2024, but wounds appear clean and without drainage, erythema, or odor and patient denies any fevers, chills, or other subjective symptoms of infection. Patient has ileal conduit and ostomy site appears clean and pink. CBC shows no leukocytosis and patient is afebrile and not tachycardic. However, these findings are seen on previous UA and appear to be patient's baseline. Will send urine culture to assess for possible infection. However, patient has ileal conduit and ostomy site appears clean and pink, and she denies any acute symptoms of UTI.Aaron Aas CBC shows no leukocytosis and patient is afebrile and not tachycardic, so low suspicion for acute infection contributing to symptoms. Patient's symptoms of weakness appears to be chronic in nature and a result of deconditioning from extended hospital admission. Suspect patient's symptoms most likely 2/2 asymptomatic hypertension. Per ACEP guidelines, no acute BP interventions recommended.  Discussed return precautions with patient and all questions answered. Patient discharged with plan to follow-up with PCP.     History     Chief Complaint   Patient presents with    Hypertension       Hypertension    HPI: Mrs. Calarco is an 83 y.o. female with past medical history remarkable for bladder cancer (s/p ileal conduit 2012), SBO (s/p resection 02/28/2024 with subsequent admission for hypoxic respiratory failure), COPD (no O2 requirement at baseline), HTN, HLD, T2DM, and CKD who presents to ED for elevated blood pressure (reported SBP 210s this morning taken at home). Patient's daughter called patient's PCP who instructed patient to take a second diltiazem 120 mg this AM. After second dose of diltiazem, patient's SBP was 170s. Patient denies have headache, vision changes, chest pain, new shortness of breath, fevers, chills, abdominal pain, nausea, vomiting, bowel or bladder changes. Patient reports that she feels weak but states she has felt this way since discharge from extended hospital admission following SBO on 04/14/24. Unable to walk independently since discharge.     Per patient and daughter, patient has had intermittent spiking blood pressures since hospital discharge on 04/14/24. Patient lives with daughter  and is seen by OT or PT 4-5 times a week who take her BP each visit. BPs range from 130/80 to 200/100. Patient has never had any symptoms associated with blood pressure spikes. Patient was previously on candesartan and amlodipine but medications stopped after recent hospital admission. Currently takes diltiazem 120 mg daily and lasix 20 mg daily.     Of note, patient has two wounds in the location of previous midline incision for SBO repair that have never healed. Wound care visits patients weekly and packs and bandages wounds. Daughter reports that superficial dressing on wound is changed daily. No purulence or bloody drainage from wounds.  Patient and family member state that overall the wounds appear to be improving.    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    Chronic kidney disease     Depression, psychotic       Diabetes mellitus       in past    Emphysema of lung       Financial difficulties     Frail elderly     Hearing impairment     Hernia     History of transfusion     Hyperlipidemia     Hypertension     Impaired mobility     Osteoporosis     Pulmonary emphysema   05/08/2015    Visual impairment        Past Surgical History:   Procedure Laterality Date    ABDOMINAL SURGERY      BLADDER SURGERY      BREAST CYST EXCISION      CHEMOTHERAPY  2012    bladder    GALLBLADDER SURGERY      stone removal    ILEOSTOMY  2012    PR COLONOSCOPY FLX DX W/COLLJ SPEC WHEN PFRMD N/A 02/09/2015    Procedure: COLONOSCOPY, FLEXIBLE, PROXIMAL TO SPLENIC FLEXURE; DIAGNOSTIC, W/WO COLLECTION SPECIMEN BY BRUSH OR WASH;  Surgeon: Lara Plants, MD;  Location: [facility] [place];  Service: Gastroenterology    PR EXPLORATORY OF ABDOMEN N/A 02/28/2024    Procedure: EXPLORATORY LAPAROTOMY, EXPLORATORY CELIOTOMY WITH OR WITHOUT BIOPSY(S);  Surgeon: Hall Levering, MD;  Location: [facility];  Service: General Surgery    PR RECONSTR TOTAL SHOULDER IMPLANT Left 04/08/2018    Procedure: ARTHROPLASTY, GLENOHUMERAL JOINT; TOTAL SHOULDER(GLENOID & PROXIMAL HUMERAL REPLACEMENT(EG, TOTAL SHOULDER);  Surgeon: Marget Sheffield, MD;  [facility];  Service: Ortho Sports Medicine    PR REOPEN RECENT ABD EXPLORATORY Midline 03/01/2024    Procedure: REOPENING OF RECENT LAPAROTOMY;  Surgeon: Sheralyn Dies., MD;  Location: OR [facility];  Service: Trauma    PR REOPEN RECENT ABD EXPLORATORY N/A 03/03/2024    Procedure: REOPENING OF RECENT LAPAROTOMY;  Surgeon: Andrey Bank, MD;  Location: OR [facility];  Service: Trauma    PR SIGMOIDOSCOPY,BIOPSY N/A 03/11/2015    Procedure: SIGMOIDOSCOPY, FLEXIBLE; WITH BIOPSY, SINGLE OR MULTIPLE;  Surgeon: Larissa Plowman, MD;  [facility];  Service: Gastroenterology    PR XCAPSL CTRC RMVL INSJ IO LENS PROSTH W/O ECP Left 06/05/2022    Procedure: EXTRACAPSULAR CATARACT REMOVAL W/INSERTION OF INTRAOCULAR LENS PROSTHESIS, MANUAL OR MECHANICAL TECHNIQUE WITHOUT ENDOSCOPIC CYCLOPHOTOCOAGULATION;  Surgeon: Sinthu Sivarama Ranjan, MD;  Location: [facility] OR [facility];  Service: Ophthalmology    PR XCAPSL CTRC RMVL INSJ IO LENS PROSTH W/O ECP Right 06/19/2022    Procedure: EXTRACAPSULAR CATARACT REMOVAL W/INSERTION OF INTRAOCULAR LENS PROSTHESIS, MANUAL OR MECHANICAL TECHNIQUE WITHOUT ENDOSCOPIC CYCLOPHOTOCOAGULATION;  Surgeon: Sinthu Sivarama Ranjan, MD;  [facility]: [facility] OR [facility];  Service: Ophthalmology       Family History   Problem Relation Age of Onset    Breast cancer Daughter 23    Diabetes Mother     Glaucoma Father     Colon cancer Neg Hx     Ovarian cancer Neg Hx     Endometrial cancer Neg Hx     Anesthesia problems Neg Hx     Bleeding Disorder Neg Hx        Social History     Socioeconomic History    Marital status: Widowed     Spouse name: None    Number of children: None    Years of education: None    Highest education level: None   Tobacco Use    Smoking status: Former     Current packs/day: 1.00     Average packs/day: 1 pack/day for 66.2 years (66.2 ttl pk-yrs)     Types: Cigarettes     Start date: 04/09/1958 Smokeless tobacco: Never    Tobacco comments:     10cpd   Vaping Use    Vaping status: Never Used   Substance and Sexual Activity    Alcohol use: No     Alcohol/week: 0.0 standard drinks of alcohol    Drug use: No    Sexual activity: Not Currently     Social Drivers of Health     Financial Resource Strain: Low Risk  (01/06/2024)    Overall Financial Resource Strain (CARDIA)     Difficulty of Paying Living Expenses: Not hard at all   Food Insecurity: No Food Insecurity (01/06/2024)    Hunger Vital Sign     Worried About Running Out of Food in the Last Year: Never true     Ran Out of Food in the Last Year: Never true   Transportation Needs: No Transportation Needs (01/06/2024)    PRAPARE - Therapist, art (Medical): No     Lack of Transportation (Non-Medical): No   Physical Activity: Inactive (08/12/2023)    Exercise Vital Sign     Days of Exercise per Week: 0 days     Minutes of Exercise per Session: 0 min   Stress: Stress Concern Present (08/12/2023)    [employer] of Occupational Health - Occupational Stress Questionnaire     Feeling of Stress : To some extent   Social Connections: Moderately Isolated (08/12/2023)    Social Connection and Isolation Panel [NHANES]     Frequency of Communication with Friends and Family: More than three times a week     Frequency of Social Gatherings with Friends and Family: Once a week     Attends Religious Services: More than 4 times per year     Active Member of [employer] or Organizations: No     Attends Banker Meetings: Never     Marital Status: Widowed   Housing: Low Risk  (01/06/2024)    Housing     Within the past 12 months, have you ever stayed: outside, in a car, in a tent, in an overnight shelter, or temporarily in someone else's home (i.e. couch-surfing)?:  No     Are you worried about losing your housing?: No       Review of Systems   Constitutional: Negative.    HENT: Negative.     Eyes: Negative.    Respiratory: Negative. Cardiovascular: Negative.    Gastrointestinal: Negative.    Musculoskeletal: Negative.    Skin:  Positive for wound.   Allergic/Immunologic: Negative.    Hematological: Negative.    Psychiatric/Behavioral: Negative.         Physical Exam     BP 165/91  - Pulse 64  - Temp 36.8 ??C (98.3 ??F) (Oral)  - Resp 20  - SpO2 94%     Physical Exam  Constitutional:       Appearance: Normal appearance.   HENT:      Head: Normocephalic and atraumatic.      Mouth/Throat:      Mouth: Mucous membranes are moist.   Eyes:      Pupils: Pupils are equal, round, and reactive to light.   Cardiovascular:      Rate and Rhythm: Normal rate and regular rhythm.      Pulses: Normal pulses.      Heart sounds: Normal heart sounds.   Pulmonary:      Effort: Pulmonary effort is normal.      Breath sounds: Normal breath sounds.   Abdominal:      General: A surgical scar is present. The ostomy site is clean. Bowel sounds are normal.      Palpations: Abdomen is soft.      Tenderness: There is no abdominal tenderness.      Comments: Midline superficial wound without drainage or purulence above the umbilicus. Lower right quadrant deep wound, packed with gauze, no drainage or purulence.   Genitourinary:     Comments: Ileal conduit urostomy pink and moist with appropriate UOP.  Musculoskeletal:         General: Normal range of motion.      Cervical back: Normal range of motion and neck supple.      Right lower leg: 1+ Edema present.      Left lower leg: 1+ Edema present.   Skin:     General: Skin is warm and dry.   Neurological:      General: No focal deficit present.      Mental Status: She is alert and oriented to person, place, and time.   Psychiatric:         Mood and Affect: Mood normal.         Behavior: Behavior normal.         ED Course          Labs Reviewed   COMPREHENSIVE METABOLIC PANEL - Abnormal; Notable for the following components:       Result Value    Albumin 3.3 (*)     All other components within normal limits   URINALYSIS WITH MICROSCOPY - Abnormal; Notable for the following components:    Leukocyte Esterase, UA Moderate (*)     Protein, UA 30 mg/dL (*)     WBC, UA 9 (*)     Bacteria, UA Occasional (*)     All other components within normal limits   CBC W/ AUTO DIFF - Abnormal; Notable for the following components:    RDW 18.5 (*)     Anisocytosis Slight (*)     All other components within normal limits   MAGNESIUM - Normal   URINE CULTURE   CBC  W/ DIFFERENTIAL    Narrative:     The following orders were created for panel order CBC w/ Differential.  Procedure                               Abnormality         Status                     ---------                               -----------         ------                     CBC w/ Differential[[id number]]         Abnormal            Final result                 Please view results for these tests on the individual orders.        Nelwyn Bangs, MS4  [employer] [employer]    I attest that I have reviewed the student note and that the components of the history of the present illness, the physical exam, and the assessment and plan documented were performed by me or were performed in my presence by the student where I verified the documentation and performed (or re-performed) the exam and medical decision making.    Rand Burrs, MD  June 05, 2024 12:38 PM         Momin Misko, Lynden Sang, MD  06/05/24 [phone]

## 2024-06-03 NOTE — Unmapped (Signed)
 The referral has refaxed -06/03/2024  [facility]   Fax#[phone]

## 2024-06-03 NOTE — Unmapped (Signed)
 Copied from CRM #[id number]. Topic: Nurse Triage - [facility] Contract Page  >> Jun 03, 2024  1:39 PM Burnett Carson, RN wrote:  Consulting civil engineer paged on-call provider, Robin Chock MD, to discuss page and review SBAR.??   Provider advised patient to proceed to ED.  Additional instructions: n/a   Consulting civil engineer returned call to patient/caregiver and reviewed provider's instructions as written above.?? Patient/caregiver verbalized understanding.

## 2024-06-03 NOTE — Unmapped (Signed)
 Pt states she is here for hypertension. Bed bound at baseline.

## 2024-06-03 NOTE — Unmapped (Signed)
 S- Patient with elevated BP, and dizziness since 8:00 a.m., also says weak. Patient's daughter said she called Dr. Jerlene Moody cell phone and he told her to give patient another dose of diltiazem 120 mg 24 hour tablet at 12:30 p.m. Patient also took diltiazem 120 mg 24 hour tablet at 8:00 a.m. Patient's daughter did not share patient's symptom's with Dr. Adrien Horner.   B- h/o HTN, type 2 diabetes, wheelchair and bed bound.   A-  BP 208/123 at 12:47 p.m., BP 210/114 at  12:35 p.m.,  BP   12:18 p.m. 198/112, BP 208/123 at 12:47 p.m.   A- Disposition is go to ED now for BP >= to 160 or diastolic >= to 100 and cardiac sypmptoms.  R- page PCP for recommendations/does patient need to go to ED as PCP was contacted earlier about patient.         Upcoming Appt:  Future Appointments   Date Time Provider Department Center   06/04/2024  2:40 PM Loreen Roers, MD [facility]   06/08/2024 11:30 AM [place] [facility] NURSE [facility]   06/09/2024 11:40 AM Loreen Roers, MD [facility]   06/25/2024 12:30 PM Isaias March, PT [facility]   06/26/2024 11:15 AM Alyson Back, MD [facility]   06/29/2024 10:45 AM Annia Kilts [facility]   07/09/2024 10:00 AM [place] Myrle Aspen, MD [facility]   10/28/2024 12:30 PM Alphonsa Asai, MD [facility]       Disposition:  Go to ED Now    Is this a pediatric patient?   No   Any recent, relevant visit?   No    Any relevant medical history?   Yes   What history?  Type 2 diabetes, HTN     Any interventions?   Yes   What has been done? (include related medication given, dose, and date/time)  Diltiazem 120 mg 24 hour tablet daily - gave her one tablet at 9:00 a.m., 2nd dose was at 12:30 p.m. per Dr. Jerlene Moody instructions  Lasix 20 mg daily       [facility] Eligibility    Patient is eligible for [facility] (according to most recent zip code and insurance information)?  No        Is the patient capable of safely leaving home to seek care? Yes      Reason for Disposition   [1] Systolic BP  >= 160 OR Diastolic >= 100 AND [2] cardiac (e.g., breathing difficulty, chest pain) or neurologic symptoms (e.g., new-onset blurred or double vision, unsteady gait)    Answer Assessment - Initial Assessment Questions  1. BLOOD PRESSURE: What is your blood pressure? Did you take at least two measurements 5 minutes apart?     BP 210/114 at  12:35 p.m.,  BP   12:18 p.m. 198/112, BP 208/123 at 12:47 p.m.   2. ONSET: When did you take your blood pressure?      See above   3. HOW: How did you take your blood pressure? (e.g., automatic home BP monitor, visiting nurse)      Automatic   4. HISTORY: Do you have a history of high blood pressure?      Yes   5. MEDICINES: Are you taking any medicines for blood pressure? Have you missed any doses recently?      Yes takes dose, has not missed any dose   6. OTHER SYMPTOMS: Do you  have any symptoms? (e.g., blurred vision, chest pain, difficulty breathing, headache, weakness)      Dizzy since 8:00 a.m., weakness. Denies all other above   7. PREGNANCY: Is there any chance you are pregnant? When was your last menstrual period?      N/a    Protocols used: Blood Pressure - High-A-AH

## 2024-06-03 NOTE — Unmapped (Signed)
 Presenting with high BP readings 210s SBP. Takes Diltiazem 120 mg, took another dose to try and bring it down. Denies chest pain, backpain or other symptoms

## 2024-06-03 NOTE — Unmapped (Signed)
 Copied from CRM #[id number]. Topic: Other - Other  >> Jun 03, 2024 12:09 PM Irma D wrote:      The PAC has received an incoming clinical call:    Caller name: Kawehi, Hostetter   Best callback number: [phone]  Relationship to Patient: Self   Describe the reason for the call:     Patient states that there wound clinic referral was sent to the wrong fax for [facility] please send this to     fax # [phone]

## 2024-06-04 ENCOUNTER — Ambulatory Visit: Admit: 2024-06-04 | Payer: MEDICARE

## 2024-06-07 NOTE — Unmapped (Signed)
 I saw and evaluated the patient, participating in the key portions of the service.  I reviewed the resident???s note.  I agree with the resident???s findings and plan.        Montine Circle, MD

## 2024-06-08 MED ADMIN — fluPHENAZine decanoate (PROLIXIN) injection 50 mg: 50 mg | INTRAMUSCULAR | @ 16:00:00

## 2024-06-08 NOTE — Unmapped (Signed)
 Administered long-acting injectable today. See MAR for additional documentation. Sharyl Nimrod, CMA

## 2024-06-09 ENCOUNTER — Ambulatory Visit: Admit: 2024-06-09 | Discharge: 2024-06-10 | Payer: MEDICARE

## 2024-06-09 DIAGNOSIS — I4891 Unspecified atrial fibrillation: Principal | ICD-10-CM

## 2024-06-09 DIAGNOSIS — M47812 Spondylosis without myelopathy or radiculopathy, cervical region: Principal | ICD-10-CM

## 2024-06-09 DIAGNOSIS — I639 Cerebral infarction, unspecified: Principal | ICD-10-CM

## 2024-06-09 DIAGNOSIS — C679 Malignant neoplasm of bladder, unspecified: Principal | ICD-10-CM

## 2024-06-09 DIAGNOSIS — N182 Chronic kidney disease, stage 2 (mild): Principal | ICD-10-CM

## 2024-06-09 DIAGNOSIS — E1149 Type 2 diabetes mellitus with other diabetic neurological complication: Principal | ICD-10-CM

## 2024-06-09 DIAGNOSIS — S31109S Unspecified open wound of abdominal wall, unspecified quadrant without penetration into peritoneal cavity, sequela: Principal | ICD-10-CM

## 2024-06-09 DIAGNOSIS — I10 Essential (primary) hypertension: Principal | ICD-10-CM

## 2024-06-09 DIAGNOSIS — F259 Schizoaffective disorder, unspecified: Principal | ICD-10-CM

## 2024-06-09 DIAGNOSIS — M4802 Spinal stenosis, cervical region: Principal | ICD-10-CM

## 2024-06-09 DIAGNOSIS — G603 Idiopathic progressive neuropathy: Principal | ICD-10-CM

## 2024-06-09 MED ORDER — ASPIRIN 325 MG TABLET,DELAYED RELEASE
ORAL_TABLET | ORAL | 3 refills | 0.00000 days | Status: CP
Start: 2024-06-09 — End: ?

## 2024-06-09 MED ORDER — DILTIAZEM CD 240 MG CAPSULE,EXTENDED RELEASE 24 HR
ORAL_CAPSULE | Freq: Every day | ORAL | 3 refills | 90.00000 days | Status: CP
Start: 2024-06-09 — End: 2025-06-09

## 2024-06-09 NOTE — Unmapped (Signed)
 Chief Complaint   Patient presents with    Follow-up       ASSESSMENT/PLAN:    Problem List Items Addressed This Visit    None        Updated problem list annotations  Database review and update  Med review and update  Lab review and update  Counseling provided and patient education    SUBJECTIVE:    Tammy Braun is a 83 y.o. 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    Recently admitted [facility] serious multiple times. See below.    Also with elevated BP- seen in ER nothing done last week.    -02/26/24: Admitted to [facility] floor; urology consult for possible ileal conduit revision.   -02/27/24: Patient level of care escalated to step-down d/t increased oxygen needs   -02/28/24: Upgraded to ICU. Intubated at bed-side. OR for ex-lap, reduction of parastomal hernia, small bowel resection with primary anastomosis, lysis adhesions, abdomen left open with ABThera.   -03/01/24: OR for attempt at abdominal closure. Left open due to desaturations during closure attempt. ABThera in place.  -03/03/24: RTOR for abdominal closure  -03/06/24: Vas cath placed, CRRT started   -03/09/24: Extubated to HFNC  -03/10/24: CRRT discontinued   -03/11/24: Re-intubated for hypoxic respiratory failure 2/2 mucus plug   -03/14/24 Extubated to HFNC   -03/15/24: Vas cath removed   -03/16/24: Placed on BiPap for complete collapse of L lung 2/2 probable mucus plugging  -03/19/24: Re-intubated for mucus plugging. Therapeutic bronch, BAL sent.   -03/22/24: Extubated to HFNC  -03/27/24: Transferred to stepdown  -04/03/24: Transferred to floor  Secondary Diagnosis: Principal Problem:    SBO (small bowel obstruction) (POA: Unknown)  Active Problems:    Tobacco use disorder (POA: Yes)  OR Procedures:    EXPLORATORY LAPAROTOMY, EXPLORATORY CELIOTOMY WITH OR WITHOUT BIOPSY(S)  02/28/2024  REOPENING OF RECENT LAPAROTOMY  02/29/2024     reports that she has quit smoking. Her smoking use included cigarettes. She started smoking about 66 years ago. She has a 66.2 pack-year smoking history. She has never used smokeless tobacco.    The following portions of the patient's history were reviewed and updated as appropriate: allergies, current medications, past family history, past medical history, past social history, past surgical history and problem list.    Review of systems for 10 organ systems conducted and no significant positives      OBJECTIVE:    Vital signs have been reviewed:   Vitals:    06/09/24 1139   BP: 152/90   Pulse: 72   Temp: 36.3 ??C (97.3 ??F)

## 2024-06-09 NOTE — Unmapped (Signed)
 Tammy Braun is competent at this time to make decisions.    She has the capacity to decide issues that apply to her health.    Thank you.      Selene Dais, MD, MPH

## 2024-06-11 NOTE — Unmapped (Signed)
 Tammy Braun reports, patients daughter is preventing them from doing wound care with the ordered materials (MediHoney dressing). She is only wanting the wet to dry, but insurance does not cover that. She reports, she was able to place the dressing yesterday, but is due back tomorrow to change it. She is seeking assistance with this matter.

## 2024-06-11 NOTE — Unmapped (Signed)
 Clinical Related: Clinical Advice: Nurse from [facility] reports that the patient's daughter is refusing to allow the nursing to do MediHoney dressings on the abdomen. The daughter is only wanting the nursing staff to do wet to dry dressing but insurance will not cover wet to dry dressing as it is not a skillable visit. Nurse is requesting assistance from patient's PCP to get proper dressings on patient's abdomen.       They have been seen for this issue previously.  [facility] preferred contact: [phone] Routine callback turnaround time: 24-48 business hours. Programmer, systems Notified)

## 2024-06-11 NOTE — Unmapped (Signed)
 Copied from CRM #[id number]. Topic: Access To Clinicians - Medication Refill  >> Jun 11, 2024  3:41 PM Lisette Ridgel wrote:  The PAC has received an incoming call regarding a new or worsening symptom:    Caller name: PTA from [facility]  Best callback number: [phone]    Describe the symptom(s): shortness of breath, but not during call.    Is this a NEW or WORSENING issue?: new    When did this start?: Last episode was last night 06/10/24  Are you having these symptoms right now?: no    How severe is it on a scale from 1 to 10?:   When was the last time this patient was seen our this clinic?      Is this a red flag symptom? yes

## 2024-06-12 DIAGNOSIS — I10 Essential (primary) hypertension: Principal | ICD-10-CM

## 2024-06-12 DIAGNOSIS — G603 Idiopathic progressive neuropathy: Principal | ICD-10-CM

## 2024-06-12 NOTE — Unmapped (Signed)
 Pt daughter reports pt is not currently experiencing shortness of breath. She explained SOB happened during the night on 6/11 while pt was lying down. She stated, once her mother got up and started moving around SOB stopped. She also reported, her mothers BP had elevated to 180, but decreased to 149 once she took  medication. She expressed thinking the recent increase in medication is the cause. Daughter is requesting PCP add on an additional BP medication.

## 2024-06-12 NOTE — Unmapped (Cosign Needed)
 [facility] [place] POPULATION HEALTH  Care Management Progress Note    Date: 06/12/2024  Outcome:  Phone outreach completed    Purpose of contact:           SW contacted patient's daughter to check in on care management tasks and discuss wound care. Patient states that at last visit this week, doubled up on prescription (dilTIAZem), daughter is worried it is not keeping her BP down and is making her feel panicky/jittery, although she thinks the jittery feelings may be a result of HBP readings. States the medication is upped from 120 to 240mg  but patient is worried that it is not reducing the blood pressure enough. Patient's daughter states she has noticed it shoots up to the 200's. Shared at baseline it's high generally. Shared fear that her mother may have a stroke. However noted that it does come down with water and patient's daughter calming her down.     Wonders if it is appropriate to go back to before dose and would like to discuss with PCP. Additionally worried she needs an actual blood pressure medicine, CM encouraged patient to discuss with Dr. Elige before making any changes herself to medications and patient's daughter in agreement. Discussed working with cardiology as well, last appointment was canceled due to the patient's hospitalization, so daughter will need to reschedule and plans to do this soon.     CM to route note to PCP regarding blood pressure.    Attempted to further discuss wound care and identify any challenges or issues that may be present.. Patient's wound healing is much better and daughter states [facility] is coming once-twice per week to provide owundcare. Was unable to identify if there are any issues with the wound care plan and attempted to identify what is being put on the wound currently, but CM was unable to keep on this subject.    Patient's daughter shared that she doesn't see the wound clinic until first week of August but feels [facility] care is going well and they can wait until then.    Patient's daughter discussed extensively her frustrations with the process of getting a new PWC as she has not been able to make contact with the owner. States she had requested an email so she can attach the medicare payment information but did not receive one. Patient's daughter shared she would like to go in-person at this point and will bring the documentation.     Additional Information/Plan:  Patient provided my direct contact information and encouraged to contact me should additional needs arise.    Time Spent Per Day:  Chart review was completed prior to outreach attempt.   06/12/2024: 20    [name], MSW  [employer] [facility] [place]

## 2024-06-13 ENCOUNTER — Encounter
Admit: 2024-06-13 | Discharge: 2024-06-22 | Disposition: A | Discharge status: Home / Self Care | Payer: MEDICARE | Admitting: Student in an Organized Health Care Education/Training Program

## 2024-06-13 ENCOUNTER — Inpatient Hospital Stay
Admit: 2024-06-13 | Discharge: 2024-06-22 | Disposition: A | Discharge status: Home / Self Care | Payer: MEDICARE | Admitting: Student in an Organized Health Care Education/Training Program

## 2024-06-13 ENCOUNTER — Ambulatory Visit: Admit: 2024-06-13 | Payer: MEDICARE

## 2024-06-13 ENCOUNTER — Ambulatory Visit
Admit: 2024-06-13 | Discharge: 2024-06-22 | Disposition: A | Discharge status: Home / Self Care | Payer: MEDICARE | Admitting: Student in an Organized Health Care Education/Training Program

## 2024-06-13 ENCOUNTER — Encounter: Admit: 2024-06-13 | Payer: MEDICARE

## 2024-06-13 ENCOUNTER — Encounter: Admit: 2024-06-13 | Discharge: 2024-06-22 | Payer: MEDICARE

## 2024-06-13 ENCOUNTER — Ambulatory Visit: Admit: 2024-06-13 | Discharge: 2024-06-22 | Payer: MEDICARE

## 2024-06-13 ENCOUNTER — Encounter
Admit: 2024-06-13 | Discharge: 2024-06-22 | Disposition: A | Discharge status: Home / Self Care | Payer: MEDICARE | Attending: Student in an Organized Health Care Education/Training Program | Admitting: Student in an Organized Health Care Education/Training Program

## 2024-06-13 ENCOUNTER — Emergency Department

## 2024-06-13 ENCOUNTER — Observation Stay

## 2024-06-13 ENCOUNTER — Observation Stay
Admission: EM | Admit: 2024-06-13 | Discharge: 2024-06-13 | Disposition: A | Discharge status: Home / Self Care | Attending: Internal Medicine | Admitting: Internal Medicine

## 2024-06-13 ENCOUNTER — Other Ambulatory Visit: Payer: Self-pay

## 2024-06-13 DIAGNOSIS — F17210 Nicotine dependence, cigarettes, uncomplicated: Secondary | ICD-10-CM | POA: Insufficient documentation

## 2024-06-13 DIAGNOSIS — Z8679 Personal history of other diseases of the circulatory system: Secondary | ICD-10-CM | POA: Diagnosis not present

## 2024-06-13 DIAGNOSIS — I5033 Acute on chronic diastolic (congestive) heart failure: Secondary | ICD-10-CM | POA: Insufficient documentation

## 2024-06-13 DIAGNOSIS — J188 Other pneumonia, unspecified organism: Secondary | ICD-10-CM | POA: Diagnosis not present

## 2024-06-13 DIAGNOSIS — R0600 Dyspnea, unspecified: Principal | ICD-10-CM

## 2024-06-13 DIAGNOSIS — E1122 Type 2 diabetes mellitus with diabetic chronic kidney disease: Secondary | ICD-10-CM | POA: Insufficient documentation

## 2024-06-13 DIAGNOSIS — Z7901 Long term (current) use of anticoagulants: Secondary | ICD-10-CM | POA: Insufficient documentation

## 2024-06-13 DIAGNOSIS — Z79899 Other long term (current) drug therapy: Secondary | ICD-10-CM | POA: Insufficient documentation

## 2024-06-13 DIAGNOSIS — N1831 Chronic kidney disease, stage 3a: Secondary | ICD-10-CM | POA: Insufficient documentation

## 2024-06-13 DIAGNOSIS — I5032 Chronic diastolic (congestive) heart failure: Secondary | ICD-10-CM | POA: Insufficient documentation

## 2024-06-13 DIAGNOSIS — R051 Acute cough: Secondary | ICD-10-CM | POA: Insufficient documentation

## 2024-06-13 DIAGNOSIS — R1310 Dysphagia, unspecified: Secondary | ICD-10-CM | POA: Insufficient documentation

## 2024-06-13 DIAGNOSIS — R059 Cough, unspecified: Secondary | ICD-10-CM | POA: Diagnosis present

## 2024-06-13 DIAGNOSIS — I130 Hypertensive heart and chronic kidney disease with heart failure and stage 1 through stage 4 chronic kidney disease, or unspecified chronic kidney disease: Secondary | ICD-10-CM | POA: Insufficient documentation

## 2024-06-13 DIAGNOSIS — J449 Chronic obstructive pulmonary disease, unspecified: Secondary | ICD-10-CM | POA: Diagnosis not present

## 2024-06-13 DIAGNOSIS — I2699 Other pulmonary embolism without acute cor pulmonale: Secondary | ICD-10-CM | POA: Diagnosis not present

## 2024-06-13 DIAGNOSIS — J189 Pneumonia, unspecified organism: Secondary | ICD-10-CM | POA: Diagnosis present

## 2024-06-13 LAB — COMPREHENSIVE METABOLIC PANEL
ALBUMIN: 3.3 g/dL — ABNORMAL LOW (ref 3.4–5.0)
ALKALINE PHOSPHATASE: 93 U/L (ref 46–116)
ALT (SGPT): <7 U/L — ABNORMAL LOW (ref 10–49)
ANION GAP: 17 mmol/L — ABNORMAL HIGH (ref 5–14)
AST (SGOT): 9 U/L (ref <=34)
BILIRUBIN TOTAL: 0.3 mg/dL (ref 0.3–1.2)
BLOOD UREA NITROGEN: 15 mg/dL (ref 9–23)
BUN / CREAT RATIO: 19
CALCIUM: 9.6 mg/dL (ref 8.7–10.4)
CHLORIDE: 100 mmol/L (ref 98–107)
CO2: 23.6 mmol/L (ref 20.0–31.0)
CREATININE: 0.81 mg/dL (ref 0.55–1.02)
EGFR CKD-EPI (2021) FEMALE: 73 mL/min/1.73m2 (ref >=60)
GLUCOSE RANDOM: 199 mg/dL — ABNORMAL HIGH (ref 70–179)
POTASSIUM: 3.8 mmol/L (ref 3.4–4.8)
PROTEIN TOTAL: 7.8 g/dL (ref 5.7–8.2)
SODIUM: 141 mmol/L (ref 135–145)

## 2024-06-13 LAB — MAGNESIUM: MAGNESIUM: 1.7 mg/dL (ref 1.6–2.6)

## 2024-06-13 LAB — CBC W/ AUTO DIFF
BASOPHILS ABSOLUTE COUNT: 0 10*9/L (ref 0.0–0.1)
BASOPHILS RELATIVE PERCENT: 0.5 %
EOSINOPHILS ABSOLUTE COUNT: 0.1 10*9/L (ref 0.0–0.5)
EOSINOPHILS RELATIVE PERCENT: 2 %
HEMATOCRIT: 37.9 % (ref 34.0–44.0)
HEMOGLOBIN: 12.4 g/dL (ref 11.3–14.9)
LYMPHOCYTES ABSOLUTE COUNT: 1.2 10*9/L (ref 1.1–3.6)
LYMPHOCYTES RELATIVE PERCENT: 15.9 %
MEAN CORPUSCULAR HEMOGLOBIN CONC: 32.6 g/dL (ref 32.0–36.0)
MEAN CORPUSCULAR HEMOGLOBIN: 26.7 pg (ref 25.9–32.4)
MEAN CORPUSCULAR VOLUME: 81.8 fL (ref 77.6–95.7)
MEAN PLATELET VOLUME: 8.2 fL (ref 6.8–10.7)
MONOCYTES ABSOLUTE COUNT: 0.7 10*9/L (ref 0.3–0.8)
MONOCYTES RELATIVE PERCENT: 9.4 %
NEUTROPHILS ABSOLUTE COUNT: 5.3 10*9/L (ref 1.8–7.8)
NEUTROPHILS RELATIVE PERCENT: 72.2 %
NUCLEATED RED BLOOD CELLS: 0 /100{WBCs} (ref <=4)
PLATELET COUNT: 274 10*9/L (ref 150–450)
RED BLOOD CELL COUNT: 4.63 10*12/L (ref 3.95–5.13)
RED CELL DISTRIBUTION WIDTH: 18.7 % — ABNORMAL HIGH (ref 12.2–15.2)
WBC ADJUSTED: 7.4 10*9/L (ref 3.6–11.2)

## 2024-06-13 LAB — URINALYSIS WITH MICROSCOPY WITH CULTURE REFLEX PERFORMABLE
BACTERIA: NONE SEEN /HPF
BILIRUBIN UA: NEGATIVE
BLOOD UA: NEGATIVE
GLUCOSE UA: NEGATIVE
KETONES UA: NEGATIVE
NITRITE UA: NEGATIVE
PH UA: 6.5 (ref 5.0–9.0)
PROTEIN UA: NEGATIVE
RBC UA: 3 /HPF (ref <=4)
SPECIFIC GRAVITY UA: 1.017 (ref 1.003–1.030)
SQUAMOUS EPITHELIAL: 1 /HPF (ref 0–5)
UROBILINOGEN UA: <2
WBC UA: 11 /HPF — ABNORMAL HIGH (ref 0–5)

## 2024-06-13 LAB — URINALYSIS, ROUTINE W REFLEX MICROSCOPIC
Bilirubin Urine: NEGATIVE
Glucose, UA: NEGATIVE mg/dL
Hgb urine dipstick: NEGATIVE
Ketones, ur: NEGATIVE mg/dL
Nitrite: NEGATIVE
Protein, ur: 100 mg/dL — AB
Specific Gravity, Urine: 1.008 (ref 1.005–1.030)
pH: 6 (ref 5.0–8.0)

## 2024-06-13 LAB — CBC WITH DIFFERENTIAL/PLATELET
Abs Immature Granulocytes: 0.02 10*3/uL (ref 0.00–0.07)
Basophils Absolute: 0 10*3/uL (ref 0.0–0.1)
Basophils Relative: 1 %
Eosinophils Absolute: 0.3 10*3/uL (ref 0.0–0.5)
Eosinophils Relative: 5 %
HCT: 39.7 % (ref 36.0–46.0)
Hemoglobin: 12.3 g/dL (ref 12.0–15.0)
Immature Granulocytes: 0 %
Lymphocytes Relative: 21 %
Lymphs Abs: 1.4 10*3/uL (ref 0.7–4.0)
MCH: 26.7 pg (ref 26.0–34.0)
MCHC: 31 g/dL (ref 30.0–36.0)
MCV: 86.1 fL (ref 80.0–100.0)
Monocytes Absolute: 0.6 10*3/uL (ref 0.1–1.0)
Monocytes Relative: 10 %
Neutro Abs: 4.2 10*3/uL (ref 1.7–7.7)
Neutrophils Relative %: 63 %
Platelets: 237 10*3/uL (ref 150–400)
RBC: 4.61 MIL/uL (ref 3.87–5.11)
RDW: 17.2 % — ABNORMAL HIGH (ref 11.5–15.5)
WBC: 6.6 10*3/uL (ref 4.0–10.5)
nRBC: 0 % (ref 0.0–0.2)

## 2024-06-13 LAB — BASIC METABOLIC PANEL WITH GFR
Anion gap: 11 (ref 5–15)
BUN: 19 mg/dL (ref 8–23)
CO2: 22 mmol/L (ref 22–32)
Calcium: 9.4 mg/dL (ref 8.9–10.3)
Chloride: 104 mmol/L (ref 98–111)
Creatinine, Ser: 0.97 mg/dL (ref 0.44–1.00)
GFR, Estimated: 59 mL/min — ABNORMAL LOW (ref >60)
Glucose, Bld: 163 mg/dL — ABNORMAL HIGH (ref 70–99)
Potassium: 3.7 mmol/L (ref 3.5–5.1)
Sodium: 137 mmol/L (ref 135–145)

## 2024-06-13 LAB — PROCALCITONIN: Procalcitonin: <0.1 ng/mL

## 2024-06-13 LAB — TROPONIN I (HIGH SENSITIVITY): Troponin I (High Sensitivity): 12 ng/L (ref <18)

## 2024-06-13 LAB — BRAIN NATRIURETIC PEPTIDE: B Natriuretic Peptide: 206.8 pg/mL — ABNORMAL HIGH (ref 0.0–100.0)

## 2024-06-13 MED ORDER — SODIUM CHLORIDE 0.9 % IV SOLN: 250.0000 mL | INTRAVENOUS | Status: DC | PRN

## 2024-06-13 MED ORDER — HYDRALAZINE HCL 20 MG/ML IJ SOLN: 5.0000 mg | Freq: Four times a day (QID) | INTRAMUSCULAR | Status: DC | PRN

## 2024-06-13 MED ORDER — VANCOMYCIN HCL IN DEXTROSE 1-5 GM/200ML-% IV SOLN
1000.0000 mg | Freq: Once | INTRAVENOUS | Status: DC
  Filled 2024-06-13: qty 200

## 2024-06-13 MED ORDER — SODIUM CHLORIDE 0.9 % IV SOLN: 1.0000 g | INTRAVENOUS | Status: DC

## 2024-06-13 MED ORDER — ONDANSETRON HCL 4 MG/2ML IJ SOLN: 4.0000 mg | Freq: Four times a day (QID) | INTRAMUSCULAR | Status: DC | PRN

## 2024-06-13 MED ORDER — SODIUM CHLORIDE 0.9 % IV SOLN
500.0000 mg | Freq: Once | INTRAVENOUS | Status: AC
  Filled 2024-06-13: qty 5

## 2024-06-13 MED ORDER — SODIUM CHLORIDE 0.9% FLUSH: 3.0000 mL | Freq: Two times a day (BID) | INTRAVENOUS | Status: DC

## 2024-06-13 MED ORDER — SODIUM CHLORIDE 0.9 % IV SOLN: 500.0000 mg | INTRAVENOUS | Status: DC

## 2024-06-13 MED ORDER — ALBUTEROL SULFATE (2.5 MG/3ML) 0.083% IN NEBU: 3.0000 mL | INHALATION_SOLUTION | RESPIRATORY_TRACT | Status: DC | PRN

## 2024-06-13 MED ORDER — SODIUM CHLORIDE 0.9 % IV SOLN
2.0000 g | Freq: Once | INTRAVENOUS | Status: AC
  Administered 2024-06-13: 2 g via INTRAVENOUS
  Filled 2024-06-13: qty 20

## 2024-06-13 MED ORDER — APIXABAN 5 MG PO TABS: 5.0000 mg | ORAL_TABLET | Freq: Two times a day (BID) | ORAL | Status: DC

## 2024-06-13 MED ORDER — HEPARIN (PORCINE) 25000 UT/250ML-% IV SOLN: 1000.0000 [IU]/h | INTRAVENOUS | Status: DC

## 2024-06-13 MED ORDER — SODIUM CHLORIDE 0.9% FLUSH: 3.0000 mL | INTRAVENOUS | Status: DC | PRN

## 2024-06-13 MED ORDER — ACETAMINOPHEN 325 MG PO TABS: 650.0000 mg | ORAL_TABLET | ORAL | Status: DC | PRN

## 2024-06-13 MED ORDER — HEPARIN BOLUS VIA INFUSION
4000.0000 [IU] | Freq: Once | INTRAVENOUS | Status: DC
  Filled 2024-06-13: qty 4000

## 2024-06-13 MED ORDER — ENOXAPARIN SODIUM 40 MG/0.4ML IJ SOSY: 40.0000 mg | PREFILLED_SYRINGE | INTRAMUSCULAR | Status: DC

## 2024-06-13 MED ORDER — TOPIRAMATE 25 MG PO TABS: 25.0000 mg | ORAL_TABLET | Freq: Every evening | ORAL | Status: DC

## 2024-06-13 MED ORDER — CARVEDILOL 6.25 MG PO TABS: 3.1250 mg | ORAL_TABLET | Freq: Two times a day (BID) | ORAL | Status: DC

## 2024-06-13 MED ORDER — FUROSEMIDE 10 MG/ML IJ SOLN
40.0000 mg | Freq: Once | INTRAMUSCULAR | Status: AC
  Filled 2024-06-13: qty 4

## 2024-06-13 MED ORDER — ASPIRIN 325 MG PO TBEC: 325.0000 mg | DELAYED_RELEASE_TABLET | Freq: Every day | ORAL | Status: DC

## 2024-06-13 MED ORDER — APIXABAN 5 MG PO TABS: 10.0000 mg | ORAL_TABLET | Freq: Two times a day (BID) | ORAL | Status: DC

## 2024-06-13 MED ORDER — GABAPENTIN 100 MG PO CAPS: 100.0000 mg | ORAL_CAPSULE | Freq: Three times a day (TID) | ORAL | Status: DC

## 2024-06-13 MED ORDER — IOHEXOL 300 MG/ML  SOLN: 80.0000 mL | Freq: Once | INTRAMUSCULAR | Status: AC | PRN

## 2024-06-13 MED ORDER — AMLODIPINE BESYLATE 5 MG PO TABS: 10.0000 mg | ORAL_TABLET | Freq: Every day | ORAL | Status: DC

## 2024-06-13 NOTE — H&P (Addendum)
 History and Physical    Brittany Hernandez WGN:[id number] DOB: 1941/10/17 DOA: 06/13/2024  PCP: Marcheta Seta, MD (Confirm with patient/family/NH records and if not entered, this has to be entered at [facility] point of entry) Patient coming from: Home  I have personally briefly reviewed patient's old medical records in [facility]  Chief Complaint: Cough, SOB  HPI: Brittany Hernandez is a 83 y.o. female with medical history significant of bladder cancer status post bladder removal with ilial conduit with urostomy, HTN, IIDM, COPD, CAD, anxiety/depression, CKD stage IIIa, recurrent UTIs, PAF not on anticoagulation, presented with worsening of cough and shortness of breath.  Symptoms started few days ago, when patient started to have exertional dyspnea and productive cough with light phlegm production.  No chest pains.  Daughter also reported the patient has been having poorly controlled blood pressure with SBP more than 170s and her legs feel heavy  ED Course: Mild hypoxia blood pressure SBP 178-180 O2 saturation 92% on room air.  CT chest without contrast showed multifocal pneumonia.  CT abdominal pelvis showed signs of air in the left kidney suspicious for pyelonephritis.  CT abdominal pelvis with contrast showed negative findings on kidney but incidental finding of right lower lobe PE.  Review of Systems: As per HPI otherwise 14 point review of systems negative.    Past Medical History:  Diagnosis Date   Bladder cancer [facility])    s/p cystectomy   Diabetes mellitus without complication (HCC)    Hypertension     Past Surgical History:  Procedure Laterality Date   ABDOMINAL HYSTERECTOMY     CHOLECYSTECTOMY     CYSTECTOMY W/ CONTINENT DIVERSION Right    URETEROSTOMY Right      reports that she has been smoking cigarettes. She has a 50 pack-year smoking history. She does not have any smokeless tobacco history on file. She reports that she does not drink alcohol and does not use  drugs.  Allergies  Allergen Reactions   Losartan     Other Reaction(s): Dizziness   Hydrochlorothiazide     SIADH   Lisinopril Swelling    Family History  Problem Relation Age of Onset   Diabetes Mellitus II Other    CAD Other     Prior to Admission medications   Medication Sig Start Date End Date Taking? Authorizing Provider  acetaminophen  (TYLENOL ) 500 MG tablet Take 1,000 mg by mouth every 8 (eight) hours as needed for pain. 11/13/17   [provider]  albuterol  (PROVENTIL  HFA;VENTOLIN  HFA) 108 (90 Base) MCG/ACT inhaler Inhale 2 puffs into the lungs every 4 (four) hours as needed for wheezing. 08/11/18   [provider]  amLODipine  (NORVASC ) 10 MG tablet Take 1 tablet (10 mg total) by mouth daily. 01/01/24   Wouk, Haynes Lips, MD  aspirin  EC 81 MG tablet Take 4 tablets (325 mg total) by mouth daily. 01/01/24   Wouk, Haynes Lips, MD  candesartan (ATACAND) 16 MG tablet Take 16 mg by mouth 2 (two) times daily.    [provider]  fluPHENAZine decanoate (PROLIXIN) 25 MG/ML injection  04/13/15   [provider]  furosemide  (LASIX ) 20 MG tablet Take 20 mg by mouth daily.    [provider]  gabapentin  (NEURONTIN ) 100 MG capsule Take 1 capsule (100 mg total) by mouth 2 (two) times daily. Home med. 12/29/23   Garrison Kanner, MD  topiramate  (TOPAMAX ) 25 MG tablet Take 1 tablet (25 mg total) by mouth Nightly. 12/31/23   Wouk,  Haynes Lips, MD    Physical Exam: Vitals:   06/13/24 0521 06/13/24 0523 06/13/24 0630 06/13/24 0730  BP:  (!) 182/71 (!) 155/125 (!) 171/74  Pulse:  75 70 65  Resp:  19  (!) 23  Temp:    98.3 F (36.8 C)  TempSrc:    Oral  SpO2:  95% 94% 92%  Weight: 60 kg     Height: 5' 1 (1.549 m)       Constitutional: NAD, calm, comfortable Vitals:   06/13/24 0521 06/13/24 0523 06/13/24 0630 06/13/24 0730  BP:  (!) 182/71 (!) 155/125 (!) 171/74  Pulse:  75 70 65  Resp:  19  (!) 23  Temp:    98.3 F (36.8 C)  TempSrc:    Oral   SpO2:  95% 94% 92%  Weight: 60 kg     Height: 5' 1 (1.549 m)      Eyes: PERRL, lids and conjunctivae normal ENMT: Mucous membranes are moist. Posterior pharynx clear of any exudate or lesions.Normal dentition.  Neck: normal, supple, no masses, no thyromegaly Respiratory: clear to auscultation bilaterally, no wheezing, no crackles.  Increasing respiratory effort. No accessory muscle use.  Cardiovascular: Regular rate and rhythm, no murmurs / rubs / gallops. No extremity edema. 2+ pedal pulses. No carotid bruits.  Abdomen: no tenderness, no masses palpated. No hepatosplenomegaly. Bowel sounds positive.  Musculoskeletal: no clubbing / cyanosis. No joint deformity upper and lower extremities. Good ROM, no contractures. Normal muscle tone.  Skin: no rashes, lesions, ulcers. No induration Neurologic: CN 2-12 grossly intact. Sensation intact, DTR normal. Strength 5/5 in all 4.  Psychiatric: Normal judgment and insight. Alert and oriented x 3. Normal mood.    Labs on Admission: I have personally reviewed following labs and imaging studies  CBC: Recent Labs  Lab 06/13/24 0549  WBC 6.6  NEUTROABS 4.2  HGB 12.3  HCT 39.7  MCV 86.1  PLT 237   Basic Metabolic Panel: Recent Labs  Lab 06/13/24 0549  NA 137  K 3.7  CL 104  CO2 22  GLUCOSE 163*  BUN 19  CREATININE 0.97  CALCIUM  9.4   GFR: Estimated Creatinine Clearance: 37.8 mL/min (by C-G formula based on SCr of 0.97 mg/dL). Liver Function Tests: No results for input(s): AST, ALT, ALKPHOS, BILITOT, PROT, ALBUMIN in the last 168 hours. No results for input(s): LIPASE, AMYLASE in the last 168 hours. No results for input(s): AMMONIA in the last 168 hours. Coagulation Profile: No results for input(s): INR, PROTIME in the last 168 hours. Cardiac Enzymes: No results for input(s): CKTOTAL, CKMB, CKMBINDEX, TROPONINI in the last 168 hours. BNP (last 3 results) No results for input(s): PROBNP in the last  8760 hours. HbA1C: No results for input(s): HGBA1C in the last 72 hours. CBG: No results for input(s): GLUCAP in the last 168 hours. Lipid Profile: No results for input(s): CHOL, HDL, LDLCALC, TRIG, CHOLHDL, LDLDIRECT in the last 72 hours. Thyroid Function Tests: No results for input(s): TSH, T4TOTAL, FREET4, T3FREE, THYROIDAB in the last 72 hours. Anemia Panel: No results for input(s): VITAMINB12, FOLATE, FERRITIN, TIBC, IRON, RETICCTPCT in the last 72 hours. Urine analysis:    Component Value Date/Time   COLORURINE YELLOW (A) 06/13/2024 0856   APPEARANCEUR HAZY (A) 06/13/2024 0856   LABSPEC 1.008 06/13/2024 0856   PHURINE 6.0 06/13/2024 0856   GLUCOSEU NEGATIVE 06/13/2024 0856   HGBUR NEGATIVE 06/13/2024 0856   BILIRUBINUR NEGATIVE 06/13/2024 0856   KETONESUR NEGATIVE 06/13/2024 0856   PROTEINUR  100 (A) 06/13/2024 0856   NITRITE NEGATIVE 06/13/2024 0856   LEUKOCYTESUR MODERATE (A) 06/13/2024 0856    Radiological Exams on Admission: CT ABDOMEN PELVIS W CONTRAST Result Date: 06/13/2024 EXAM: CT ABDOMEN AND PELVIS WITH CONTRAST 06/13/2024 10:15:29 AM TECHNIQUE: CT of the abdomen and pelvis was performed with the administration of intravenous contrast. Multiplanar reformatted images are provided for review. Automated exposure control, iterative reconstruction, and/or weight based adjustment of the mA/kV was utilized to reduce the radiation dose to as low as reasonably achievable. COMPARISON: CT of the abdomen pelvis without contrast 12/25/2023. CT chest without contrast 06/13/2024. CLINICAL HISTORY: Abdominal pain, acute, nonlocalized. BIBA due to difficulty breathing. Also stating her BP is high and her dr changed her medication on Tuesday and she has not had any relief since starting the new med. Pt has CPOD. She has a wet cough and is able to cough stuff up. EMS states 180/84 and all other vitals are normal. FINDINGS: LOWER CHEST: Incidental note is  made of a right lower lobe pulmonary embolus within the interlobar artery. The main pulmonary artery is enlarged, measuring 3.4 cm. Coronary moderate calcifications are present. LIVER: The liver is unremarkable. GALLBLADDER AND BILE DUCTS: Gallbladder is unremarkable. No biliary ductal dilatation. SPLEEN: No acute abnormality. PANCREAS: No acute abnormality. ADRENAL GLANDS: No acute abnormality. KIDNEYS, URETERS AND BLADDER: Bilateral simple renal cysts are again noted. The largest right-sided cyst is at the upper pole measuring 3.2 cm. The largest left cyst measures 2 mm posteriorly. Per consensus, no follow-up is needed for simple Bosniak type 1 and 2 renal cysts, unless the patient has a malignancy history or risk factors. Air within the renal pelvis bilaterally is likely secondary to the urostomy. No stones in the kidneys or ureters. No hydronephrosis. No perinephric or periureteral stranding. Urinary bladder is mostly collapsed. GI AND BOWEL: A posterior right diaphragmatic hernia is present with nonobstructed loops of colon. Moderate stool somewhat distends the rectum measuring 6 cm in transverse diameter. Diverticular changes are present. No bowel obstruction. PERITONEUM AND RETROPERITONEUM: No ascites. No free air. VASCULATURE: Extensive atherosclerotic changes are present in the aorta and branch vessels. No aneurysm is present. LYMPH NODES: No lymphadenopathy. REPRODUCTIVE ORGANS: No acute abnormality. BONES AND SOFT TISSUES: Grade 1 degenerative anterolisthesis at L4-5 is stable. Severe right and moderate left foraminal stenosis is stable. No focal osseous lesions are present. Right lower quadrant ostomy is noted. IMPRESSION: 1. Right lower lobe pulmonary embolus within the interlobar artery. Main pulmonary artery is enlarged, measuring 3.4 cm. 2. Posterior right diaphragmatic hernia with nonobstructed loops of colon. 3. Moderate stool distending the rectum, measuring 6 cm in transverse diameter.  Diverticular changes are present. No bowel obstruction. Electronically signed by: Audree Leas MD 06/13/2024 10:35 AM EDT RP Workstation: [id number]   CT Chest Wo Contrast Result Date: 06/13/2024 CLINICAL DATA:  Respiratory illness. EXAM: CT CHEST WITHOUT CONTRAST TECHNIQUE: Multidetector CT imaging of the chest was performed following the standard protocol without IV contrast. RADIATION DOSE REDUCTION: This exam was performed according to the departmental dose-optimization program which includes automated exposure control, adjustment of the mA and/or kV according to patient size and/or use of iterative reconstruction technique. COMPARISON:  Chest x-ray earlier same day. FINDINGS: Cardiovascular: The heart is enlarged. No substantial pericardial effusion. Coronary artery calcification is evident. Ascending thoracic aorta measures 4 cm diameter. Enlargement of the pulmonary outflow tract/main pulmonary arteries suggests pulmonary arterial hypertension. Mediastinum/Nodes: No mediastinal lymphadenopathy. Upper normal 10 mm short axis AP window lymph node. No  evidence for gross hilar lymphadenopathy although assessment is limited by the lack of intravenous contrast on the current study. The esophagus has normal imaging features. Markedly large hiatal hernia. Lungs/Pleura: Advanced changes of emphysema 10 mm ill-defined right upper lobe pulmonary nodule on image 42/3. Patchy ground-glass opacity noted anterior left upper lobe with patchy ground-glass and nodular consolidative opacity in the left base. Volume loss with atelectasis and/or scarring noted in the posterior lung bases adjacent to the hiatal hernia. No substantial pleural effusion. Upper Abdomen: Stomach, transverse colon, and pancreas are contained in the hiatal hernia. Gas is identified in the interpolar left kidney, likely within the collecting system. 3 cm exophytic lesion upper pole right kidney is similar to abdomen CT 12/25/2023 with  attenuation too high to be a simple cyst. Stable 11 mm right adrenal nodule with similar appearance of diffuse left adrenal thickening. Musculoskeletal: No worrisome lytic or sclerotic osseous abnormality. Status post left shoulder replacement. IMPRESSION: 1. 10 mm ill-defined right upper lobe pulmonary nodule. Consider one of the following in 3 months for both low-risk and high-risk individuals: (a) repeat chest CT, (b) follow-up PET-CT, or (c) tissue sampling. This recommendation follows the consensus statement: Guidelines for Management of Incidental Pulmonary Nodules Detected on CT Images: From the Fleischner Society 2017; Radiology 2017; 284:228-243. 2. Patchy ground-glass opacity anterior left upper lobe with patchy ground-glass and nodular consolidative opacity in the left base. Imaging features compatible with multifocal pneumonia. Close attention on follow-up recommended. 3. Markedly large hiatal hernia contains the stomach, transverse colon, and pancreas. 4. Gas in the interpolar left kidney, likely within the collecting system. Correlation with urinalysis recommended to exclude emphysematous pyelitis. Consider CT abdomen and pelvis to further evaluate. Intravenous contrast would prove helpful if renal function permits. 5. Enlargement of the pulmonary outflow tract/main pulmonary arteries suggests pulmonary arterial hypertension. 6. 3 cm exophytic lesion upper pole right kidney is similar to abdomen CT 12/25/2023 with attenuation too high to be a simple cyst. Renal protocol CT or MRI recommended to further evaluate. 7. Stable 11 mm right adrenal nodule with similar appearance of diffuse left adrenal thickening. 8. 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 Electronically Signed   By: Donnal Fusi M.D.   On: 06/13/2024 08:29   DG Chest Portable 1 View Result Date: 06/13/2024 CLINICAL DATA:  Acute dyspnea, cough EXAM: PORTABLE CHEST 1 VIEW COMPARISON:  12/25/2023 FINDINGS:  Cardiomegaly. Unchanged elevation of the right hemidiaphragm with a large paraesophageal hernia. Diffuse bilateral interstitial opacity. No acute osseous findings. IMPRESSION: 1. Cardiomegaly with diffuse bilateral interstitial opacity, consistent with edema or atypical/viral infection. No focal airspace opacity. 2.  Large paraesophageal hernia. Electronically Signed   By: Fredricka Jenny M.D.   On: 06/13/2024 06:25    EKG: Independently reviewed.  Sinus rhythm, frequent PVCs.  Assessment/Plan Principal Problem:   PNA (pneumonia) Active Problems:   CAP (community acquired pneumonia)   Acute on chronic diastolic CHF (congestive heart failure) (HCC)  (please populate well all problems here in Problem List. (For example, if patient is on BP meds at home and you resume or decide to hold them, it is a problem that needs to be her. Same for CAD, COPD, HLD and so on)  RLL PE - Unclear whether this condition is provoked versus unprovoked. - Records show that the patient was at [facility] for prolonged hospitalization in February/March and was diagnosed with A-fib and started on Eliquis.  However Eliquis was discontinued recently.  Currently, there is no evidence of Eliquis  treatment failure as a cause of PE.  Will restart Eliquis.  DVT study and echocardiogram. - Discussed with family regarding decision to reinitiate anticoagulation, family however request that the patient be transferred to [facility]  where she can be taken care of as a whole I also discussed the case with patient's primary physician Dr. Adrien Horner, who also recommend patient transfer to [facility] for further management.  [facility] transfer center contacted.  Multifocal pneumonia - Patient reported occasionally cough after eating solid food - Will send speech evaluation - Continue current CAP coverage with ceftriaxone  and azithromycin  Uncontrolled hypertension - Continue amlodipine  - Add Coreg  History of CAD - No acute  concern, continue aspirin  and statin  History of PAF - No acute concern  COPD -Stable - Continue home breathing meds - Continue prednisone 5 mg daily  CKD stage IIIa -Euvolemic -BP control as above DVT prophylaxis: Eliquis Code Status: Full code Family Communication: Daughter over the phone Disposition Plan: Expect less than 2 midnight hospital stay Consults called: None Admission status: Telemetry observation   [place] MD [facility] Pager [phone]  06/13/2024, 11:31 AM

## 2024-06-13 NOTE — TOC Transition Note (Signed)
 Transition of Care [facility]) - Discharge Note   Patient Details  Name: Brittany Hernandez MRN: [id number] Date of Birth: June 17, 1941  Transition of Care [facility]) CM/SW Contact:  Brittany L Fallou Hulbert, LCSW Phone Number: 06/13/2024, 12:39 PM   Clinical Narrative:     CSW attempted to complete [facility] consult and MOON letter. Pt was admitted and was offered a bed on the floor however, the patients daughter, Brittany Hernandez was present and advocated for her mother to be transferred to [facility] for treatment. Brittany Hernandez declined medications initially to treat blood pressure but after CSW intervened, Brittany Hernandez agreed to allow RN to give medication to treat blood pressure. Pt awaiting transport to [facility]. MOON Letter reviewed but pt and daughter refused to sign because pt is being transferred to [facility]. TOC singing off.         Patient Goals and CMS Choice            Discharge Placement                       Discharge Plan and Services Additional resources added to the After Visit Summary for                                       Social Drivers of Health (SDOH) Interventions SDOH Screenings   Food Insecurity: No Food Insecurity (01/06/2024)   Received from [facility]  Housing: Low Risk  (12/28/2023)  Transportation Needs: No Transportation Needs (01/06/2024)   Received from [facility]  Utilities: Low Risk  (01/06/2024)   Received from [facility]  Financial Resource Strain: Low Risk  (01/06/2024)   Received from [facility]  Physical Activity: Inactive (08/12/2023)   Received from [facility]  Social Connections: Moderately Isolated (08/12/2023)   Received from [facility]  Stress: Stress Concern Present (08/12/2023)   Received from [facility]  Tobacco Use: Medium Risk (06/09/2024)   Received from [facility] Literacy: Low Risk  (04/06/2021)   Received from [facility]     Readmission Risk Interventions     No data to display

## 2024-06-13 NOTE — Progress Notes (Signed)
 PHARMACY - ANTICOAGULATION CONSULT NOTE  Pharmacy Consult for apixaban Indication: pulmonary embolus  Allergies  Allergen Reactions   Losartan     Other Reaction(s): Dizziness   Hydrochlorothiazide     SIADH   Lisinopril Swelling    Patient Measurements: Height: 5' 1 (154.9 cm) Weight: 60 kg (132 lb 4.4 oz) IBW/kg (Calculated) : 47.8 HEPARIN  DW (KG): 59.8  Vital Signs: Temp: 98.3 F (36.8 C) (06/14 0730) Temp Source: Oral (06/14 0730) BP: 171/74 (06/14 0730) Pulse Rate: 65 (06/14 0730)  Labs: Recent Labs    06/13/24 0549  HGB 12.3  HCT 39.7  PLT 237  CREATININE 0.97  TROPONINIHS 12    Estimated Creatinine Clearance: 37.8 mL/min (by C-G formula based on SCr of 0.97 mg/dL).   Medical History: Past Medical History:  Diagnosis Date   Bladder cancer [facility])    s/p cystectomy   Diabetes mellitus without complication (HCC)    Hypertension     Medications:  (Not in a hospital admission)  Scheduled:   amLODipine   10 mg Oral Daily   apixaban  10 mg Oral BID   Followed by   Cecily Cohen ON 06/20/2024] apixaban  5 mg Oral BID   aspirin  EC  325 mg Oral Daily   carvedilol  3.125 mg Oral BID WC   furosemide   40 mg Intravenous Once   gabapentin   100 mg Oral BID   sodium chloride  flush  3 mL Intravenous Q12H   topiramate   25 mg Oral Nightly   Infusions:   sodium chloride      [START ON 06/14/2024] azithromycin     [START ON 06/14/2024] cefTRIAXone  (ROCEPHIN )  IV     vancomycin      PRN: sodium chloride , acetaminophen , albuterol , ondansetron  (ZOFRAN ) IV, sodium chloride  flush  Assessment: 83 yo F to start apixaban treatment dosing for PE.  Per discussion with MD, MD says daughter says that patient is not on a blood thinner.   Was previously on apixaban (per Rx fill hx last dispensed 04/10/24 x 30 days). At this time, Not sure when last dose was taken. Hgb 12.3  Plt 237    Goal of Therapy:  Monitor platelets by anticoagulation protocol: Yes   Plan:  Will order  treatment dose apixaban 10 mg po BID x 7 days then 5 mg BID  CBC per protocol  Thomasine Flick PharmD Clinical Pharmacist 06/13/2024

## 2024-06-13 NOTE — ED Provider Notes (Signed)
 [facility] Provider Note    Event Date/Time   First MD Initiated Contact with Patient 06/13/24 [phone]     (approximate)   History   Shortness of Breath   HPI Brittany Hernandez is a 83 y.o. female who has a complex medical history according to the medical record that includes pulmonary hypertension, essential hypertension, schizophrenia, etc.  She also reports a history of COPD.  She presents by EMS for evaluation of shortness of breath.  She said it is gradually gotten worse over the last couple of days.  She claims she has been taking her medications including her furosemide  20 mg daily.  She said that her feet have been swelling and have been painful.  She has a harder time breathing at night and when if she lies flat.  No oxygen requirement at baseline.  No fever nor chest pain.  She says she feels a tightness in her chest particularly when it is hard for her to breathe.  She says she has not smoked a cigarette for 3 days.  She feels a little bit wheezy and has had a thick cough.     Physical Exam   Triage Vital Signs: ED Triage Vitals  Encounter Vitals Group     BP 06/13/24 0523 (!) 182/71     Girls Systolic BP Percentile --      Girls Diastolic BP Percentile --      Boys Systolic BP Percentile --      Boys Diastolic BP Percentile --      Pulse Rate 06/13/24 0523 75     Resp 06/13/24 0523 19     Temp 06/13/24 0520 98.1 F (36.7 C)     Temp Source 06/13/24 0520 Oral     SpO2 06/13/24 0523 95 %     Weight 06/13/24 0521 60 kg (132 lb 4.4 oz)     Height 06/13/24 0521 1.549 m (5' 1)     Head Circumference --      Peak Flow --      Pain Score 06/13/24 0521 0     Pain Loc --      Pain Education --      Exclude from Growth Chart --     Most recent vital signs: Vitals:   06/13/24 0523 06/13/24 0630  BP: (!) 182/71 (!) 155/125  Pulse: 75 70  Resp: 19   Temp:    SpO2: 95% 94%    General: Awake, no obvious distress.  Appears chronically ill but  nontoxic. CV:  Good peripheral perfusion.  Regular rate and rhythm, normal heart sounds. Resp:  Normal effort. Speaking easily and comfortably, no accessory muscle usage nor intercostal retractions.  No cough observed during my assessment.  She has no wheezing and is moving good air, but she has a thick sounding rattle during expiratory phase. Abd:  No distention.  No tenderness to palpation. Other:  Trace peripheral edema bilaterally.   ED Results / Procedures / Treatments   Labs (all labs ordered are listed, but only abnormal results are displayed) Labs Reviewed  CBC WITH DIFFERENTIAL/PLATELET - Abnormal; Notable for the following components:      Result Value   RDW 17.2 (*)    All other components within normal limits  BASIC METABOLIC PANEL WITH GFR - Abnormal; Notable for the following components:   Glucose, Bld 163 (*)    GFR, Estimated 59 (*)    All other components within normal limits  BRAIN NATRIURETIC  PEPTIDE - Abnormal; Notable for the following components:   B Natriuretic Peptide 206.8 (*)    All other components within normal limits  TROPONIN I (HIGH SENSITIVITY)     EKG  ED ECG REPORT I, Lynnda Sas, the attending physician, personally viewed and interpreted this ECG.  Date: 06/13/2024 EKG Time: 6:44 AM Rate: 73 Rhythm: sinus rhythm QRS Axis: normal Intervals: Sinus rhythm with occasional PVC ST/T Wave abnormalities: Non-specific ST segment / T-wave changes, but no clear evidence of acute ischemia. Narrative Interpretation: no definitive evidence of acute ischemia; does not meet STEMI criteria.    RADIOLOGY See ED course for details   PROCEDURES:  Critical Care performed: No  .1-3 Lead EKG Interpretation  Performed by: Lynnda Sas, MD Authorized by: Lynnda Sas, MD     Interpretation: normal     ECG rate:  75   ECG rate assessment: normal     Rhythm: sinus rhythm     Ectopy: none     Conduction: normal       IMPRESSION / MDM /  ASSESSMENT AND PLAN / ED COURSE  I reviewed the triage vital signs and the nursing notes.                              Differential diagnosis includes, but is not limited to, COPD exacerbation, CHF, fluid overload, pneumonia, ACS.  Patient's presentation is most consistent with acute presentation with potential threat to life or bodily function.  Labs/studies ordered: CBC with differential, BMP, high sensitive troponin, BNP, portable chest x-ray, CT chest.  Interventions/Medications given:  Medications - No data to display  (Note:  hospital course my include additional interventions and/or labs/studies not listed above.)   Symptoms sound most consistent with CHF exacerbation.  However, I reviewed her medical record extensively including multiple notes from [facility], and I see no evidence that she has CHF.  She does, however, have a diagnosis of pulmonary hypertension, and that may be the reason for the furosemide  and could also lead to fluid retention and development of pulmonary edema and her reported peripheral edema.  She has no infectious signs or symptoms at this time including no fever and no tachycardia and she has no leukocytosis.  Her BNP is slightly elevated to a 6.8 but I do not have a value against which to compare.  No chest pain, no ischemia on EKG, and no elevated high-sensitivity troponin.  BMP is within normal limits.  I am awaiting the patient's chest x-ray results.  Her oxygen saturation has been consistently in the low to mid 90s.  The patient is on the cardiac monitor to evaluate for evidence of arrhythmia and/or significant heart rate changes.   Clinical Course as of 06/13/24 0706  Sat Jun 13, 2024  0633 I independently viewed and interpreted the patient's chest x-ray(s), and I also reviewed the radiologist's report.  There is some diffuse opacity throughout, may represent fluid particularly given the patient's slightly elevated BNP.  However the radiologist also mention  the possibility of atypical infection.  I ordered a CT chest without contrast to further assess and differentiate between pneumonia or fluid. [CF]  [phone] Of note, the medical record indicates she was at [facility] about a week ago with concerns for her blood pressure.  She had a reassuring workup at that time, but she says this is before she started feeling short of breath.  Her blood pressure is quite elevated tonight, but  she has also not had her morning medications yet, and her blood pressure is apparently always elevated at least to a degree.  I do not think it is directly contributing to her acute symptoms but is rather indicative of a chronic issue. [CF]  [id number] Transferring ED care to Dr. Peggi Bowels to follow up CT chest and treat/dispo appropriately. [CF]    Clinical Course User Index [CF] Lynnda Sas, MD     FINAL CLINICAL IMPRESSION(S) / ED DIAGNOSES   Final diagnoses:  Dyspnea, unspecified type  Acute cough     Rx / DC Orders   ED Discharge Orders     None        Note:  This document was prepared using Dragon voice recognition software and may include unintentional dictation errors.   Lynnda Sas, MD 06/13/24 [phone]

## 2024-06-13 NOTE — ED Notes (Signed)
 [name] at bedside. [name] states she spoke to Jeane Miguel, MD, about transferring pt to [place]. States she wants no further treatment until she speaks with Jeane Miguel, MD, again. Pt in agreement. Jeane Miguel, MD, aware.

## 2024-06-13 NOTE — ED Provider Notes (Addendum)
 Incidental on CT scan were discussed with patient patient does not meet sepsis criteria only elevated respiratory rate given patient's age and elevated respiratory rate and borderline oxygen levels will discuss possible team for admission due to concern for multifocal pneumonia.  Holding on fluids at this time given hypertensive not tachycardic and given patient's age no signs of sepsis.  We discussed the incidental findings of nodule she is aware.  We discussed the gas on the left kidney she really denies any urinary symptoms I will add on CT scan to further evaluate given patient's age and may be not the best historian her urine is from an ostomy so could look infected regardless so we will get CT to ensure no other acute pathology.  Will discuss with hospital team for admission due to concern for multi focal pneumonia and pt is agreeable to that plan.   Lubertha Rush, MD 06/13/24 0912   12:29 PM Patient's daughter is out walking in the hallways asking to talk to staff.  I updated the patient's daughter who was not initially in the room when I talked to patient about being admitted here which again patient was comfortable staying her. Family member was declining treatment for her blood clots.  She wanted to wait till she got to [facility].  I discuss with pt that it is very important as if the blood clot gets worse and it could cause her heart to stop.  Patient's daughter has also been refusing blood pressure medications which we discussed how the Lasix  would help with her blood pressure.  It looks like the hospitalist already working on try to transfer her to [facility] and have been having discussions with family member as well.   1:39 PM Family member now again out in the hallway and stops to talk to me.  Stating that she wants a wheelchair so she can take patient to [facility].  I discussed with patient's daughter that this is not recommended that there is a risk for death or permanent disability that she should be  transported through safe means with getting a transfer approved and going by EMS.  She understands this potential risk for death or permanent disability but she is adamant that she wants to leave immediately.  She states that this hospital runs to slow and that the hospitalist she cannot understand due to his accent and she does not want to stay here any longer.  She reports miscommunication about the transfer as the hospitalist told her that the transfer was accepted and the secretary told her it was not and she reports feeling frustrated.  Discussed with patient that I would not advise her to leave the emergency room as this is AGAINST MEDICAL ADVICE.  I have alerted Dr. Jeane Miguel who is now in to talk to the patient and family.   Lubertha Rush, MD 06/13/24 1231    Lubertha Rush, MD 06/13/24 1341

## 2024-06-13 NOTE — Evaluation (Addendum)
 Clinical/Bedside Swallow Evaluation Patient Details  Name: Brittany Hernandez MRN: [id number] Date of Birth: 06/03/1941  Today's Date: 06/13/2024 Time: SLP Start Time (ACUTE ONLY): 1145 SLP Stop Time (ACUTE ONLY): 1230 SLP Time Calculation (min) (ACUTE ONLY): 45 min  Past Medical History:  Past Medical History:  Diagnosis Date   Bladder cancer (HCC)    s/p cystectomy   Diabetes mellitus without complication (HCC)    Hypertension    Past Surgical History:  Past Surgical History:  Procedure Laterality Date   ABDOMINAL HYSTERECTOMY     CHOLECYSTECTOMY     CYSTECTOMY W/ CONTINENT DIVERSION Right    URETEROSTOMY Right    HPI:  Pt is a 83 y.o. female with medical history significant of Markedly Large Hiatal Hernia, Schizoaffective disorder, bladder Cancer status post bladder removal with ilial conduit with urostomy, HTN, IIDM, COPD, CAD, anxiety/depression, CKD stage IIIa, recurrent UTIs, PAF not on anticoagulation, presented with worsening of cough and shortness of breath.  Symptoms started few days ago, when patient started to have exertional dyspnea and productive cough with light phlegm production.  No chest pains. Daughter also reported the patient has been having poorly controlled blood pressure with SBP more than 170s and her legs feel heavy.     CT of Chest/Abd: Advanced changes of emphysema 10 mm ill-defined right  upper lobe pulmonary nodule on image 42/3(f/u chest CT or PET scan recommended). Patchy ground-glass  opacity noted anterior left upper lobe with patchy ground-glass and  nodular consolidative opacity in the left base. Volume loss with  atelectasis and/or scarring noted in the posterior lung bases  adjacent to the large hiatal hernia. No substantial pleural effusion.   Upper Abdomen: Stomach, transverse colon, and pancreas are contained  in the markedly large hiatal hernia.  Pt denied any swallowing problems but endorsed Belching and Globus feedings w/ meals.  Edentulous  baseline.     Assessment / Plan / Recommendation  Clinical Impression    Pt seen for BSE today. Daughter arrived post evaluation; presented w/ handouts. Pt verbal and A/O x3; engaged easily w/ this SLP. Pt requested some of the chocolate pudding- can you fed it to me?. Pt followed general commands w/ cue and helped to sit herself upright in bed. Noted per chart Imgagin: Large Hiatal Hernia baseline. Edentulous.  On [place] O2 support- 2L; afebrile, WBC not elevated.    OF NOTE: Pt strongly endorses s/s of REFLUX at home; reports episodes Belching and having to spit up/out increased phlegm. She also endorses mild cough around/after meals. CT Imaging: Stomach, transverse colon, and pancreas are contained in the markedly Large Hiatal Hernia.. Pt denied any trouble swallowing at home.    Pt appears to present w/ functional oropharyngeal phase swallowing w/ No overt oropharyngeal phase dysphagia appreciated during oral intake of trials accepted; No neuromuscular swallowing deficits appreciated. Pt appears at reduced risk for aspiration from an oropharyngeal phase standpoint following general aspiration precautions. HOWEVER, pt has a baseline of REFLUX s/s and a markedly Large Hiatal Hernia.  ANY Dysmotility or Regurgitation of Reflux material can increase risk for aspiration of the Reflux material during Retrograde flow thus impact Voicing and Pulmonary status. Pt described issues of globus, cough, and spitting of phlegm.     Pt sat upright in bed(positioning support given) and consumed several trials of thin liquids Via Cup/Straw, purees, but declined solid foods w/ No overt clinical s/s of aspiration noted: clear vocal quality b/t trials, no decline in pulmonary status, no cough, no decline  in O2 sats(98%). Oral phase appeared [facility] for bolus management and timely A-P transfer/clearing of material. OM exam was [facility] for oral clearing; lingual/labial movements. No unilateral weakness. Speech clear.  OF NOTE: a  cough w/ Phlegm expectoration occurred post all po intake during conversation.    Recommend continue a fairly Regular diet per pt request(cut/chopped meats, moistened foods) w/ thin liquids. General aspiration precautions. REFLUX PRECAUTIONS: Rest Breaks during meals/oral intake to allow for Esophageal clearing. Remain upright post meals for ~1 hour; HOB elevated at night when sleeping.  D/w MD re: a PPI for pt.   Recommend pt f/u w/ GI for assessment/management of REFLUX s/s and Education on Hiatal Hernia. Discussion and handouts given on REFLUX precautions. No further skilled ST services indicated. MD to reconsult ST services if any new needs while admitted. NSG updated. Pt appreciative of Education information; left w/ pt's belongings to review w/ Family.  SLP Visit Diagnosis: Dysphagia, unspecified (R13.10) (Esophageal phase Dysmotility)    Aspiration Risk   (reduced from an oropharyngela phase standpoint, but increased from a REFLUX standpoint)    Diet Recommendation   Thin;Age appropriate regular (chopped meats, foods; moistened foods)= a fairly Regular diet per pt request(cut/chopped meats, moistened foods) w/ thin liquids. General aspiration precautions. REFLUX PRECAUTIONS: Rest Breaks during meals/oral intake to allow for Esophageal clearing. Remain upright post meals for ~1 hour; HOB elevated at night when sleeping  Medication Administration: Whole meds with puree (if needed)    Other  Recommendations Recommended Consults: Consider GI evaluation;Consider esophageal assessment (for pt/Family education) Oral Care Recommendations: Oral care BID;Staff/trained caregiver to provide oral care     Assistance Recommended at Discharge  Intermittent for follow through w/ precautions  Functional Status Assessment Patient has not had a recent decline in their functional status (no change in oropharyngeal swallowing)  Frequency and Duration  (n/a)   (n/a)       Prognosis Prognosis for improved  oropharyngeal function: Fair (-Good) Barriers to Reach Goals: Time post onset;Severity of deficits Barriers/Prognosis Comment: Markedly Large Hiatal Hernia; edentulous      Swallow Study   General Date of Onset: 06/13/24 HPI: Pt is a 83 y.o. female with medical history significant of Markedly Large Hiatal Hernia, Schizoaffective disorder, bladder Cancer status post bladder removal with ilial conduit with urostomy, HTN, IIDM, COPD, CAD, anxiety/depression, CKD stage IIIa, recurrent UTIs, PAF not on anticoagulation, presented with worsening of cough and shortness of breath.  Symptoms started few days ago, when patient started to have exertional dyspnea and productive cough with light phlegm production.  No chest pains. Daughter also reported the patient has been having poorly controlled blood pressure with SBP more than 170s and her legs feel heavy.   CT of Chest/Abd: Advanced changes of emphysema 10 mm ill-defined right  upper lobe pulmonary nodule on image 42/3(f/u chest CT or PET scan recommended). Patchy ground-glass  opacity noted anterior left upper lobe with patchy ground-glass and  nodular consolidative opacity in the left base. Volume loss with  atelectasis and/or scarring noted in the posterior lung bases  adjacent to the large hiatal hernia. No substantial pleural effusion.  Upper Abdomen: Stomach, transverse colon, and pancreas are contained  in the markedly large hiatal hernia.  Pt denied any swallowing problems but endorsed Belching and Globus feedings w/ meals.  Edentulous baseline. Type of Study: Bedside Swallow Evaluation Previous Swallow Assessment: none Diet Prior to this Study: Regular;Thin liquids (Level 0) Temperature Spikes Noted: No (wbc 6.6) Respiratory Status: Nasal cannula (2L)  History of Recent Intubation: No Behavior/Cognition: Alert;Cooperative;Pleasant mood;Distractible;Requires cueing Oral Cavity Assessment: Within Functional Limits Oral Care Completed by SLP: Recent  completion by staff Oral Cavity - Dentition: Edentulous (baseline) Vision: Functional for self-feeding Self-Feeding Abilities: Able to feed self;Needs assist;Needs set up (held cup to drink) Patient Positioning: Upright in bed (supported) Baseline Vocal Quality: Normal Volitional Cough: Strong Volitional Swallow: Able to elicit    Oral/Motor/Sensory Function Overall Oral Motor/Sensory Function: Within functional limits (no unilateral weakness noted in general movements, speech)   Ice Chips Ice chips: Not tested   Thin Liquid Thin Liquid: Within functional limits Presentation: Self Fed;Straw (~6 ozs total) Other Comments: juice, water    Nectar Thick Nectar Thick Liquid: Not tested   Honey Thick Honey Thick Liquid: Not tested   Puree Puree: Within functional limits Presentation: Spoon (fed; 10 trials of pudding)   Solid     Solid: Not tested (pt declined)        Darla Edward, MS, CCC-SLP Speech Language Pathologist Rehab Services; [facility] [phone] (ascom) Oksana Deberry 06/13/2024,1:05 PM

## 2024-06-13 NOTE — ED Notes (Signed)
 This RN at bedside speaking with patients daughter. Patients daughter is IT consultant, doctor and refusing to be admitted to ready room. Patients daughter reports staff is slow and not treating her mother. Daughter informed multiple times that room is ready if she will let us  take patient to it and we have antibiotics, blood pressure medicine, and misc. meds ordered and ready to give patient. Daughter is non responsive to what this RN is telling her and continues to talk about transport to another hospital.   Patient has been told by Doctor Jeane Miguel, this RN, and August Leavens that patient does not have a bed at [facility]. Patients daughter has repeatedly told staff to transport her mother to [facility] now and staff has had many conversations with her that transport cannot do that without a bed available. Staff has been in patients room repeatedly since 0700 trying to accomplish tasks and daughter refuses to let staff give all medicines or treat her mother.

## 2024-06-13 NOTE — ED Notes (Addendum)
 [name] refused any further care for pt. [name] verbalized understanding of leaving AMA up to and including worsening of condition which could lead to death of the pt. [name] refused to sign AMA paperwork. Multiple staff had conversations with pt and daughter. [name] stated staff were slow and did not know what they were doing and refused treatment.

## 2024-06-13 NOTE — Progress Notes (Signed)
 Explained to patient daughter regarding the finding of PE which requires systemic anticoagulation.  Daughter refused Eliquis treatment.  Daughter request that the patient transfer to [facility] for further management.  I contacted [facility] transfer center, currently at [facility] are at full capacity and has no available bed.  Family updated about the situation on [facility] side.

## 2024-06-13 NOTE — Consult Note (Signed)
 PHARMACY - ANTICOAGULATION CONSULT NOTE  Pharmacy Consult for Heparin  infusion Indication: pulmonary embolus  Allergies  Allergen Reactions   Losartan     Other Reaction(s): Dizziness   Hydrochlorothiazide     SIADH   Lisinopril Swelling    Patient Measurements: Height: 5' 1 (154.9 cm) Weight: 60 kg (132 lb 4.4 oz) IBW/kg (Calculated) : 47.8 HEPARIN  DW (KG): 59.8  Vital Signs: Temp: 98.3 F (36.8 C) (06/14 0730) Temp Source: Oral (06/14 0730) BP: 197/97 (06/14 1230) Pulse Rate: 78 (06/14 1230)  Labs: Recent Labs    06/13/24 0549  HGB 12.3  HCT 39.7  PLT 237  CREATININE 0.97  TROPONINIHS 12    Estimated Creatinine Clearance: 37.8 mL/min (by C-G formula based on SCr of 0.97 mg/dL).   Medical History: Past Medical History:  Diagnosis Date   Bladder cancer [facility])    s/p cystectomy   Diabetes mellitus without complication (HCC)    Hypertension     Medications:  No AC prior to admission per med history. Noted patient on Apixaban 5mg  po BID for stroke prevention with last filled on 04/10/2024.  Assessment: Patient admitted with cough and shortness of breath. PMH includes COPD, neuropathy, DM, tobacco Abuse, Chronic pain, HTN, DJD neck, hip, cervical stenosis, cervical radiculopathy, Chronic renal disease, Depression , Onychomycosis, Ca Trigone Bladder, Cervical Myofascial pain, chronic tension headaches with medication overuse (Goody powders), CAD, CKD III, recurrent UTI, and Hx of Afi previously on Apixaban (recently stopped fro unknown reason).  Pharmacy consulted to start and manage heparin  infusion. Patient's family refusing apixaban as oral alternative and requesting transfer to [facility] hospital.  Baseline H/H and plt - appropriate to initiate heparin  infusion. Baseline aPTT, HL, and INR ordered  Goal of Therapy:  Heparin  level 0.3-0.7 units/ml aPTT 66-102 seconds Monitor platelets by anticoagulation protocol: Yes   Plan:  Give 4000 units bolus x 1 Start  heparin  infusion at 1000 units/hr Check anti-Xa level in 8 hours and daily while on heparin  Continue to monitor H&H and platelets  Nasiah Lehenbauer Rodriguez-Guzman PharmD, BCPS 06/13/2024 1:05 PM

## 2024-06-13 NOTE — Care Management Obs Status (Signed)
 MEDICARE OBSERVATION STATUS NOTIFICATION   Patient Details  Name: Brittany Hernandez MRN: [id number] Date of Birth: 01-06-1941   Medicare Observation Status Notification Given:  Yes    [place] L Noell Shular, LCSW 06/13/2024, 12:45 PM

## 2024-06-13 NOTE — Unmapped (Signed)
 ULTRASOUND PIV PROCEDURE NOTE    Indications:   Poor venous access.    Ultrasound guidance was necessary to obtain access.     Procedure Details:  Identity of the patient was confirmed via name, medical record number and date of birth. The availability of the correct equipment was verified.    The vein was identified and measured for ultrasound catheter insertion.       Vein measurement (without tourniquet):   0.36 cm   A(n) 20 gauge 1.75 catheter was selected based on the recommendations below:    [facility] Catheter/Vein Ratio Guidelines    Chart to determine PIV catheter size/length to use based on vein diameter and depth   Catheter Gauge Size (g)  22g 20g 18g   Catheter length (inches)  1.75 1.75 1.75   Catheter diameter measurement (mm) 0.9 mm 1.1 mm 1.3 mm          Minimum required vein diameter       Sonosite (cm)  0.27 cm 0.33 cm 0.39 cm          Maximum vein depth  1.25 cm 1.25 cm 1.25 cm          The field was prepared with necessary supplies and equipment.  Probe cover and sterile gel utilized. Insertion site was prepped with chlorhexidineand allowed to dry.  The catheter extension was primed with normal saline.  The US  PIV was placed in the L Upper Arm with 1attempt(s). See LDA for additional details.    Catheter aspirated, 10 mL blood return present. The catheter was then flushed with 10 mL of normal saline. Insertion site cleansed, and dressing applied per manufacturer guidelines. The catheter was inserted with difficulty due to poor vasculature, with difficulty due to patient positioning by Piedad crecencio KATHEE Suzanna, RN.    Thank you,     Norina Cowper crecencio KATHEE Suzanna, RN    Ultrasound Resource Nurse    Workup / Procedure Time:  30 minutes

## 2024-06-13 NOTE — Unmapped (Signed)
 Labs drawn this morning at [facility]

## 2024-06-13 NOTE — Unmapped (Signed)
 erroneous This encounter was created in error - please disregard.

## 2024-06-13 NOTE — Unmapped (Signed)
 Pt arrives c/o SOB and bilat foot swelling.     Denies CP.     Was seen at [facility] who scanned pt, per chart review, (left AMA)  Ct abd showed incidentally a right lower lobe PE w/in interlobar artery.   No DVT seen in US     Denies being on blood thinners.

## 2024-06-13 NOTE — Unmapped (Signed)
 Family Medicine Inpatient Service History & Physical    Assessment & Plan:   Tammy Braun is a 83 y.o. female with a PMH of  bladder cancer status post bladder removal with ilial conduit with urostomy, large paraesophageal hernia, pHTN, HTN, IIDM, COPD, CAD, anxiety/depression, CKD stage IIIa, recurrent UTIs, PAF not on anticoagulation,  presenting to the [facility] ED for shortness of breath.     Principal Problem:    Multifocal pneumonia  Active Problems:    Hypertension    COPD (chronic obstructive pulmonary disease) with emphysema       Pulmonary hypertension       Subacute pulmonary embolism       History of MDR Pseudomonas aeruginosa infection    Chronic right-sided heart failure         Active Problems    #Multifocal Pneumonia  #History of MDR Pseudomonas   Grew MDR pseudomonas during admission in February-March 2025 ex-lap, reduction of parastomal hernia, and small bowel resection with primary anastomosis. Resistant to Meropenum, Pip-tazo, cefepime, ceftazidime. Required Zerbaxa for improvement. CT at [facility] and [facility] showing multifocal pulmonary infectious opacities, more extensive in the left lung, poor aeration on exam, but no fever, leukocytosis, no oxygen requirement. Comfortable on room air. All previous cultures show susceptibility to Levaquin, will opt for oral course given how clinically well appearing patient is.  - PO Levaquin 750mg  daily x5 days. 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 Poor opacification of distal pulmonary artery branches in the left lower lobe likely reflects poor right heart output, though additional small distal emboli are not excluded. Previously was started on eliquis at admission in March, but self stopped due to falls and concerns for excessive bleeding. After counseling patient and daughter this admission, now amenable to restarting eliquis to treat PE and Afib.  -Eliquis 10mg  BID x7 days then eliquis 5mg  BID   - CBC, Cr, Hgb    #Pulmonary Hypertension  #RHF #Concern for Cor Pulmonale  EF at 70% hyperdynamic LV, dilated right and left left atrium, right ventricle moderately dilated with moderately reduced systolic function, severe pulmonary hypertension with estimated PASP 03/11/2024. Had left sided chest pain earlier 6/14, now resolving.  - ECHO  - IV Lasix 60  - can consider outpatient cardiology followup  - Hold home aspirin 325mg    - Hold home Lasix 20mg   -Trop at [facility] 12 this am    #COPD GOLD 2 #Emphysema  #Tobacco Use Disorder  Imaging showing upper lung predominant emphysema, 65+ year smoking history. Increase in purulence and sputum productions for past few days. Last PFTs 05/27/2024. Attempting to quit but relapsed recently.   - Duonebs q6H prn  - Tobacco Cessation Consult  - Nicotine Patches ordered    #HTN  Severe uncontrolled hypertension, unable to tolerate several antihypertensives in the past. Hydrochlorothiazide causing SIADH, lisinopril causing anaphylaxis, amlodipine causing BLE edema, losartan causing dizziness etc. Currently only on diltiazem, dose recently increased from 120 this past Tuesday.  - cont home cardizem 240mg , has been off for past few days    #Pressure wound   - wound care consult      Chronic Problems    #Paroxysmal Afib  - Was started on Eliquis in March but patient stopped due to a series of falls and  concern for excessive bleeding. Open to restarting tonight.  - Restart home eliquis    #T2DM  A1c 5.6 April 23, 2024  Not on any medication, metformin dropped down too low  Daily BMP      #Type IV paraesophageal hernia   Large posterior diaphragmatic hernia containing the entirety of the stomach, transverse colon, and portions of the small intestine. This hernia has been present for many years dating back and largely unchanged from CT abdomen/pelvis in 2012 and markedly unchanged on CT from 3/14 from the CT obtained on initial presentation. ]  -Speech eval during last admission, regular diet cut into small pieces. Can consider reevaluation    #Bladder cancer s/p ileal conduit with urostomy  Well appearing today with her regular amount of output  - Ostomy management    #CKD stage IIIa  Cr 0.81 this admission  CrCl 46.1 mL/min, EGFR 73 mL/min  - Avoid nephrotoxic meds as possible    The patient's presentation is complicated by the following clinically significant conditions requiring additional evaluation and treatment: - Hypercoagulable state requiring additional attention to DVT prophylaxis and treatment or chronic anticoagulation secondary to Atrial Fibrillation  and Prior PE/DVT  - Multi Drug Resistant Infection affecting antibiotic choices       Checklist:  Diet: Regular Diet  DVT PPx: Eliquis  Code Status: Full Code  Dispo: Patient appropriate for Inpatient based on expectation of ongoing need for hospitalization greater than two midnights based on severity of presentation/services including pneumonia, PE    Team Contact Information:   Primary Team: Family Medicine Blue  Primary Resident: Tammy KATHEE Sharps, MD  Resident's Pager: [facility] [phone])    Chief Concern:   Multifocal pneumonia    Subjective:   Tammy Braun is a 83 y.o. female with a PMH of  bladder cancer status post bladder removal with ilial conduit with urostomy, bladder cancer, a-fib, HTN, IIDM, COPD, CAD, anxiety/depression, CKD stage IIIa, recurrent UTIs, PAF not on anticoagulation,  presenting to the [facility] ED for shortness of breath.     History obtained by this provider, obtained from patient and daughter Tammy Braun.       HPI:  Patient with 2-3 days of shortness of breath, bilateral lower extremity swelling, wet cough with increase in baseline amount of sputum. Has been getting home PT following surgery in April, has been difficult the past few day. Before procedure was using cane and walker to ambulate, most recently can only walk short distances while assisted, which worsens SOB. 65+ pack-year smoking history but no cigarettes in the past few days while feeling poor. SOB worsened at 4am today, called 911, Systolic BP was 180-200 systolic with EMS, they were brought into [facility]. Evaluated in ED by CT without contrast then CT with constrast, showed concerns for multifocal pneumonia, chronic PE, gas in kidney concerning for pyelo. After admission patient and daughter decided to leave [facility] for treatment at [facility] and presented to the [facility] ED.      Pertinent Surgical Hx  Past Surgical History[1]     Pertinent Family Hx  Family History[2]     Pertinent Social Hx   Social History [3]     Allergies  Lisinopril, Losartan, and Hctz [hydrochlorothiazide]    I reviewed the Medication List. The current list is Accurate  Prior to Admission medications   Medication Dose, Route, Frequency   acetaminophen (TYLENOL) 500 MG tablet 500 mg, Oral, Every 6 hours   albuterol HFA 90 mcg/actuation  inhaler 2 puffs, Inhalation, Every 4 hours PRN   aspirin 325 MG delayed release (EC) tablet 1 po every day; may take a second tablet when needed.   blood sugar diagnostic (GLUCOSE BLOOD) Strp Disp test strips preferred by insurance plan. Testing qday, Dx: E11.9 (Type 2 DM- controlled)   blood-glucose meter kit Disp. blood glucose meter kit preferred by patient's insurance. Check blood sugars as directed by provider. Dx: Diabetes, E11.9   cyclobenzaprine (FLEXERIL) 5 MG tablet 5 mg, Oral, 2 times a day PRN   dilTIAZem (CARDIZEM CD) 240 MG 24 hr capsule 240 mg, Oral, Daily (standard)   fluPHENAZine decanoate (PROLIXIN) 25 mg/mL injection 50 mg, Intramuscular, Every 30 days   furosemide (LASIX) 20 MG tablet 20 mg, Oral, Daily (standard)   gabapentin (NEURONTIN) 100 MG capsule 100 mg, Oral, 3 times a day (standard)   inhalational spacing device (E-Z SPACER) Spcr 1 each, Miscellaneous, Every 4 hours PRN   lancets Misc Disp. lancets #30 or amount allowed, Testing once Qday. Dx: E11.9 (Diabetes- controlled)   MEDICAL SUPPLY ITEM Entreal formula nutritionally supplement complete caloric density   MEDICAL SUPPLY ITEM Compression stockings knee high   miscellaneous medical supply Misc 1 each, Miscellaneous, 4 times a day   trolamine salicylate (ASPERCREME) 10 % cream Topical, 2 times a day (standard)   underpads 2.6 X 2.9 feet Pads 1 each, Miscellaneous, 2 times a day       [employer] Decision Maker:  Ms. Kistler currently has decisional capacity for healthcare decision-making and is able to designate a surrogate healthcare decision maker. Ms. Amedee designated healthcare decision maker(s) is Tammy Braun Hales (the patient's adult child) as denoted by stated patient preference.    Objective:   Physical Exam:  Temp:  [36.7 ??C (98.1 ??F)-36.9 ??C (98.4 ??F)] 36.7 ??C (98.1 ??F)  Pulse:  [73-78] 78  SpO2 Pulse:  [72] 72  Resp:  [18-27] 19  BP: (160-187)/(77-103) 187/87  SpO2:  [94 %-98 %] 97 %    Gen: NAD, converses, comfortable in bed on RA  Eyes: Sclera anicteric, EOMI grossly normal   HENT: Atraumatic, normocephalic  Neck: Trachea midline  Heart: RRR  Lungs: Poor aeration bilaterally worse on right side  Abdomen: Ostomy in place, surgical scars, no signs of infection. NTND  Extremities: Trace edema BLE  Neuro: Grossly symmetric, non-focal    Skin:  Pressure injury on sacrum/gluteal region  Psych: Alert, oriented      Soyla Sharps MD, (he/him)  Resident Physician, PGY- 1  Department of Family Medicine           [1]   Past Surgical History:  Procedure Laterality Date    ABDOMINAL SURGERY      BLADDER SURGERY      BREAST CYST EXCISION      CHEMOTHERAPY  2012    bladder    GALLBLADDER SURGERY      stone removal    ILEOSTOMY  2012    PR COLONOSCOPY FLX DX W/COLLJ SPEC WHEN PFRMD N/A 02/09/2015    Procedure: COLONOSCOPY, FLEXIBLE, PROXIMAL TO SPLENIC FLEXURE; DIAGNOSTIC, W/WO COLLECTION SPECIMEN BY BRUSH OR WASH;  Surgeon: Eleanor CHRISTELLA Sorrel, MD; Location: [facility] [place];  Service: Gastroenterology    PR EXPLORATORY OF ABDOMEN N/A 02/28/2024    Procedure: EXPLORATORY LAPAROTOMY, EXPLORATORY CELIOTOMY WITH OR WITHOUT BIOPSY(S);  Surgeon: Hollice Toribio Birmingham, MD;  Location: [facility];  Service: General Surgery    PR RECONSTR TOTAL SHOULDER IMPLANT Left 04/08/2018  Procedure: ARTHROPLASTY, GLENOHUMERAL JOINT; TOTAL SHOULDER(GLENOID & PROXIMAL HUMERAL REPLACEMENT(EG, TOTAL SHOULDER);  Surgeon: Alease Carrie Oyster, MD;  Location: [facility] OR [facility];  Service: Ortho Sports Medicine    PR REOPEN RECENT ABD EXPLORATORY Midline 03/01/2024    Procedure: REOPENING OF RECENT LAPAROTOMY;  Surgeon: Vicci Janas CROME., MD;  Location: OR [facility];  Service: Trauma    PR REOPEN RECENT ABD EXPLORATORY N/A 03/03/2024    Procedure: REOPENING OF RECENT LAPAROTOMY;  Surgeon: Quinn Tinnie Verla Dasie, MD;  Location: OR [facility];  Service: Trauma    PR SIGMOIDOSCOPY,BIOPSY N/A 03/11/2015    Procedure: SIGMOIDOSCOPY, FLEXIBLE; WITH BIOPSY, SINGLE OR MULTIPLE;  Surgeon: Sandrea CHRISTELLA Mood, MD;  [facility];  Service: Gastroenterology    PR XCAPSL CTRC RMVL INSJ IO LENS PROSTH W/O ECP Left 06/05/2022    Procedure: EXTRACAPSULAR CATARACT REMOVAL W/INSERTION OF INTRAOCULAR LENS PROSTHESIS, MANUAL OR MECHANICAL TECHNIQUE WITHOUT ENDOSCOPIC CYCLOPHOTOCOAGULATION;  Surgeon: Sinthu Sivarama Ranjan, MD;  Location: [facility] OR [facility];  Service: Ophthalmology    PR XCAPSL CTRC RMVL INSJ IO LENS PROSTH W/O ECP Right 06/19/2022    Procedure: EXTRACAPSULAR CATARACT REMOVAL W/INSERTION OF INTRAOCULAR LENS PROSTHESIS, MANUAL OR MECHANICAL TECHNIQUE WITHOUT ENDOSCOPIC CYCLOPHOTOCOAGULATION;  Surgeon: Sinthu Sivarama Ranjan, MD;  Location: [facility] OR [facility];  Service: Ophthalmology   [2]   Family History  Problem Relation Age of Onset    Breast cancer Daughter 37    Diabetes Mother     Glaucoma Father     Colon cancer Neg Hx     Ovarian cancer Neg Hx     Endometrial cancer Neg Hx     Anesthesia problems Neg Hx     Bleeding Disorder Neg Hx    [3]   Social History  Socioeconomic History    Marital status: Widowed   Tobacco Use    Smoking status: Former     Current packs/day: 1.00     Average packs/day: 1 pack/day for 66.2 years (66.2 ttl pk-yrs)     Types: Cigarettes     Start date: 04/09/1958    Smokeless tobacco: Never    Tobacco comments:     10cpd   Vaping Use    Vaping status: Never Used   Substance and Sexual Activity    Alcohol use: No     Alcohol/week: 0.0 standard drinks of alcohol    Drug use: No    Sexual activity: Not Currently     Social Drivers of Health     Financial Resource Strain: Low Risk  (01/06/2024)    Overall Financial Resource Strain (CARDIA)     Difficulty of Paying Living Expenses: Not hard at all   Food Insecurity: No Food Insecurity (01/06/2024)    Hunger Vital Sign     Worried About Running Out of Food in the Last Year: Never true     Ran Out of Food in the Last Year: Never true   Transportation Needs: No Transportation Needs (01/06/2024)    PRAPARE - Therapist, art (Medical): No     Lack of Transportation (Non-Medical): No   Physical Activity: Inactive (08/12/2023)    Exercise Vital Sign     Days of Exercise per Week: 0 days     Minutes of Exercise per Session: 0 min   Stress: Stress Concern Present (08/12/2023)    [employer] of Occupational Health - Occupational Stress Questionnaire     Feeling of Stress : To some extent   Social  Connections: Moderately Isolated (08/12/2023)    Social Connection and Isolation Panel     Frequency of Communication with Friends and Family: More than three times a week     Frequency of Social Gatherings with Friends and Family: Once a week     Attends Religious Services: More than 4 times per year     Active Member of [employer] or Organizations: No     Attends Banker Meetings: Never     Marital Status: Widowed   Housing: Low Risk  (01/06/2024)    Housing     Within the past 12 months, have you ever stayed: outside, in a car, in a tent, in an overnight shelter, or temporarily in someone else's home (i.e. couch-surfing)?: No     Are you worried about losing your housing?: No

## 2024-06-13 NOTE — Unmapped (Signed)
 Emergency Department Provider Note        ED Clinical Impression     Final diagnoses:   None       ED Assessment/Plan     Tammy Braun is a 83 y.o. female with a PMH of HTN, T2DM, stage 2 CKD, presenting to the ED for shortness of breath. The patient's daughter at bedside reports the patient to have been BIB EMS to [facility] PTA for a few days of worsening shortness of breath and bilateral leg pain with swelling. From [facility], CT scans showed multifocal pneumonia, air in the left kidney suspicious for pyelonephritis, and a right lower lobe PE.  They left AMA to come here at the urging of patient's PCP, Dr. Elige.    Vitals within normal limits. On exam, patient is in no acute distress. Decreased lung sounds to the R base on ausculation. Minimal extremity edema bilaterally.     9:22 PM  After numerous delays in patient's workup due to various IV mishaps, patient CT PE was finally done.  At this time, a read is still pending, but we ambulated the patient, she desatted to 87%.  Spoke with the family medicine team, it was amenable to admission.      Update:  CT PE read shows subacute/chronic segmental right lower lobe pulmonary embolus, pulmonary hypertension, as well as multifocal pneumonia.      History     Chief Complaint   Patient presents with    Shortness of Breath     HPI    83 y.o. female with medical history significant of bladder cancer status post bladder removal with ilial conduit with urostomy, bladder cancer, a-fib, HTN, IIDM, COPD, CAD, anxiety/depression, CKD stage IIIa, recurrent UTIs, PAF not on anticoagulation, presenting for cough and shortness of breath. The patient's daughter at bedside reports the patient to have been BIB EMS to [facility] P[facility] for a few days of worsening shortness of breath and bilateral leg pain with swelling. Of note, patient just left AMA from [facility] health, where she had been admitted for multifocal pneumonia, pyelonephritis, and right lower lobe PE.     The patient endorses normal urination and bowel movement. She has a long history of tobacco usage, but has not smoked a cigarette for the past 3 days. Denies fevers or rhinorrhea. On another note, the daughter states the patient's symptoms to have occurred after starting an increased dosage of diltiazem for her BP 4 days ago.     Based on chart review from [facility], H&P from earlier today Mild hypoxia blood pressure SBP 178-180 O2 saturation 92% on room air. CT chest without contrast showed multifocal pneumonia. CT abdominal pelvis showed signs of air in the left kidney suspicious for pyelonephritis. CT abdominal pelvis with contrast showed negative findings on kidney but incidental finding of right lower lobe PE.    On my review of the CT abdomen pelvis report, I do not see any mention of pyelonephritis, and the UA does not appear consistent with pyelonephritis, with only moderate leukocytes, many bacteria, negative nitrite, 11-20 WBC, 0-5 RBC.      Records show that the patient was at [facility] for prolonged hospitalization in February/March and was diagnosed with A-fib and started on Eliquis.  However Eliquis was discontinued recently.  Currently, there is no evidence of Eliquis treatment failure as a cause of PE.  Will restart Eliquis.  DVT study and echocardiogram.  - Discussed with family regarding decision to reinitiate anticoagulation, family  however request that the patient be transferred to [facility]  where she can be taken care of as a whole I also discussed the case with patient's primary physician Dr. Elige, who also recommend patient transfer to [facility] for further management.  [place] transfer center contacted.    Her multifocal pneumonia has been treated with ceftriaxone and azithromycin.       Past Medical History[1]    Past Surgical History[2]    Family History[3]    Social History[4]    Review of Systems    Physical Exam     BP 160/103  - Pulse 76  - Temp 36.7 ??C (98.1 ??F) (Oral)  - Resp 18  - SpO2 94%     Physical Exam  Vitals and nursing note reviewed.   Constitutional:       General: She is not in acute distress.     Appearance: Normal appearance. She is well-developed.   HENT:      Head: Normocephalic and atraumatic.     Eyes:      Extraocular Movements: Extraocular movements intact.      Conjunctiva/sclera: Conjunctivae normal.       Cardiovascular:      Rate and Rhythm: Normal rate and regular rhythm.   Pulmonary:      Effort: Pulmonary effort is normal.      Breath sounds: Examination of the right-lower field reveals decreased breath sounds. Decreased breath sounds present. No wheezing or rhonchi.      Comments: Decreased lung sounds to the R base on ausculation.     Musculoskeletal:         General: Normal range of motion.      Cervical back: Normal range of motion.      Comments: Minimal lower extremity edema bilaterally.        Skin:     General: Skin is dry.      Coloration: Skin is not jaundiced or pale.     Neurological:      General: No focal deficit present.      Mental Status: She is alert and oriented to person, place, and time.     Psychiatric:         Behavior: Behavior normal.         ED Course            MDM          DATA REVIEWED AND ANALYZED    Tests, Orders, and External Documentation:     I reviewed the patient's prior external notes from the following unique sources:    [facility] ED at [facility] Admission note dated 06/13/2024 by Dr. Cort Mana to see what brought the patient to the ED today     Unique labs Reviewed:   CBC without evidence for leukocytosis or anemia  CMP: Anion Gap 17, Glucose 199  Magnesium 1.7  UA: WBC 11, LE small, Nitrite negative, Blood negative, RBC 3, Squamous cells 1, Bacteria none     My assessment required discussion with an independent historian:  Family member at the bedside due to patient being poor historian    Independent Interpretation of Tests:     EKG: Sinus rhythm, rate 78, frequent PVCs there are no ischemic changes or arrhythmias noted.     Radiology Imaging Studies:   Chest x-ray shows persistent bibasilar airspace opacities and bilateral pleural effusions, concerning for pneumonia.  CT PE study shows subacute/chronic segmental right lower lobe pulmonary embolus, pulmonary hypertension,  as well as multifocal pneumonia.      Discussion with Other Providers:    I discussed the management of this patient with the:     Admitting physician: Family medicine admitting resident and attending regarding the patient's disposition.        RISK OF COMPLICATIONS / DISPOSITION     Disposition Decision  ? Admitted.  Patient to be admitted for pneumonia, PE, what appears to be new pulmonary hypertension, and desaturation on ambulation.  This requires a threat to life or bodily function and requires hospital level care to prevent deterioration.      Documentation assistance was provided by Reena Flatten, Scribe, on June 13, 2024 at 3:44 PM for Kayla Hoe, MD.    -------------------------------------------------------------------------------------  Documentation assistance was provided by the scribe in my presence.  The documentation recorded by the scribe has been reviewed by me and accurately reflects the services I personally performed.             [1]   Past Medical History:  Diagnosis Date    Anxiety     Arthritis     At risk for falls     Breast cyst     Cancer        bladder    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    Chronic kidney disease     Depression, psychotic        Diabetes mellitus        in past    Emphysema of lung        Financial difficulties     Frail elderly     Hearing impairment     Hernia     History of transfusion     Hyperlipidemia     Hypertension     Impaired mobility     Osteoporosis     Pulmonary emphysema    05/08/2015    Visual impairment    [2]   Past Surgical History:  Procedure Laterality Date    ABDOMINAL SURGERY      BLADDER SURGERY      BREAST CYST EXCISION      CHEMOTHERAPY  2012    bladder GALLBLADDER SURGERY      stone removal    ILEOSTOMY  2012    PR COLONOSCOPY FLX DX W/COLLJ SPEC WHEN PFRMD N/A 02/09/2015    Procedure: COLONOSCOPY, FLEXIBLE, PROXIMAL TO SPLENIC FLEXURE; DIAGNOSTIC, W/WO COLLECTION SPECIMEN BY BRUSH OR WASH;  Surgeon: Eleanor CHRISTELLA Sorrel, MD;  Location: [facility] [place];  Service: Gastroenterology    PR EXPLORATORY OF ABDOMEN N/A 02/28/2024    Procedure: EXPLORATORY LAPAROTOMY, EXPLORATORY CELIOTOMY WITH OR WITHOUT BIOPSY(S);  Surgeon: Hollice Toribio Birmingham, MD;  Location: [facility];  Service: General Surgery    PR RECONSTR TOTAL SHOULDER IMPLANT Left 04/08/2018    Procedure: ARTHROPLASTY, GLENOHUMERAL JOINT; TOTAL SHOULDER(GLENOID & PROXIMAL HUMERAL REPLACEMENT(EG, TOTAL SHOULDER);  Surgeon: Alease Carrie Oyster, MD;  Location: [facility] OR [facility];  Service: Ortho Sports Medicine    PR REOPEN RECENT ABD EXPLORATORY Midline 03/01/2024    Procedure: REOPENING OF RECENT LAPAROTOMY;  Surgeon: Vicci Janas CROME., MD;  Location: OR [facility];  Service: Trauma    PR REOPEN RECENT ABD EXPLORATORY N/A 03/03/2024    Procedure: REOPENING OF RECENT LAPAROTOMY;  Surgeon: Quinn Tinnie Verla Dasie, MD;  Location: OR [facility];  Service: Trauma    PR SIGMOIDOSCOPY,BIOPSY N/A 03/11/2015    Procedure: SIGMOIDOSCOPY, FLEXIBLE; WITH BIOPSY, SINGLE OR MULTIPLE;  Surgeon: Sandrea CHRISTELLA  Drucie, MD;  [facility]: [facility];  Service: Gastroenterology    PR XCAPSL CTRC RMVL INSJ IO LENS PROSTH W/O ECP Left 06/05/2022    Procedure: EXTRACAPSULAR CATARACT REMOVAL W/INSERTION OF INTRAOCULAR LENS PROSTHESIS, MANUAL OR MECHANICAL TECHNIQUE WITHOUT ENDOSCOPIC CYCLOPHOTOCOAGULATION;  Surgeon: Sinthu Sivarama Ranjan, MD;  Location: [facility] OR [facility];  Service: Ophthalmology    PR XCAPSL CTRC RMVL INSJ IO LENS PROSTH W/O ECP Right 06/19/2022    Procedure: EXTRACAPSULAR CATARACT REMOVAL W/INSERTION OF INTRAOCULAR LENS PROSTHESIS, MANUAL OR MECHANICAL TECHNIQUE WITHOUT ENDOSCOPIC CYCLOPHOTOCOAGULATION;  Surgeon: Sinthu Sivarama Ranjan, MD;  Location: [facility] OR [facility];  Service: Ophthalmology   [3]   Family History  Problem Relation Age of Onset    Breast cancer Daughter 62    Diabetes Mother     Glaucoma Father     Colon cancer Neg Hx     Ovarian cancer Neg Hx     Endometrial cancer Neg Hx     Anesthesia problems Neg Hx     Bleeding Disorder Neg Hx    [4]   Social History  Socioeconomic History    Marital status: Widowed   Tobacco Use    Smoking status: Former     Current packs/day: 1.00     Average packs/day: 1 pack/day for 66.2 years (66.2 ttl pk-yrs)     Types: Cigarettes     Start date: 04/09/1958    Smokeless tobacco: Never    Tobacco comments:     10cpd   Vaping Use    Vaping status: Never Used   Substance and Sexual Activity    Alcohol use: No     Alcohol/week: 0.0 standard drinks of alcohol    Drug use: No    Sexual activity: Not Currently     Social Drivers of Health     Financial Resource Strain: Low Risk  (01/06/2024)    Overall Financial Resource Strain (CARDIA)     Difficulty of Paying Living Expenses: Not hard at all   Food Insecurity: No Food Insecurity (01/06/2024)    Hunger Vital Sign     Worried About Running Out of Food in the Last Year: Never true     Ran Out of Food in the Last Year: Never true   Transportation Needs: No Transportation Needs (01/06/2024)    PRAPARE - Therapist, art (Medical): No     Lack of Transportation (Non-Medical): No   Physical Activity: Inactive (08/12/2023)    Exercise Vital Sign     Days of Exercise per Week: 0 days     Minutes of Exercise per Session: 0 min   Stress: Stress Concern Present (08/12/2023)    [employer] of Occupational Health - Occupational Stress Questionnaire     Feeling of Stress : To some extent   Social Connections: Moderately Isolated (08/12/2023)    Social Connection and Isolation Panel     Frequency of Communication with Friends and Family: More than three times a week     Frequency of Social Gatherings with Friends and Family: Once a week     Attends Religious Services: More than 4 times per year     Active Member of [employer] or Organizations: No     Attends Banker Meetings: Never     Marital Status: Widowed   Housing: Low Risk  (01/06/2024)    Housing     Within the past 12 months, have you ever stayed: outside, in a car, in  a tent, in an overnight shelter, or temporarily in someone else's home (i.e. couch-surfing)?: No     Are you worried about losing your housing?: No        Rojelio Kayla ORN, MD  06/14/24 [phone]

## 2024-06-13 NOTE — Unmapped (Addendum)
#  C/f early SBO vs ileus (improving)  Minimal BM on 6/18, on 6/19 had emesis after breakfast and was more distended. KUB with c/f SBO. NPO all day, no BM with enema, did have a BM with suppository after enema. Surgery consulted given pt s/p small bowel resection for SBO in February, agree with CT abdomen pelvis w/oral contrast which showed c/f early SBO vs ileus. She was NPO for 24 hours and given 500cc bolus. Pt has now had multiple BM, feeling much better. At the time of discharge she was having regular bowel movements and tolerating an oral diet.      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 is a 22F who p/w two days of worsening dyspnea worse with lying flat and ambulating. Work up c/w dyspneas 2/2 RVF and fluid hypervolemia, PE, and PNA. For RVF diuresed with IV lasix  and she developed an AKI. We held her home furosemide  and have not diuresed further. She now appears euvolemic. Cr still elevated. Resumed PO lasix  during admission and pt remained euvolemic. She did have some desaturations with movement.    #CAP (resolved)  History of MDR pseudomonal CAP in the past. On this presentation increased shortness of breath, found to have concern for PNA based on imaging this presentation. Completed 5 days of Levaquin . Repeat CXR obtained during admission with improvement.      #Chronic Segmental RLL PE  Noted on CTA, appears to be new. L PE resolved. Was previously on eliquis , however pt discontinued due to concern about falls and risk of bleeding. Received heparin  gtt for 24 hrs given c/f SBO, however now tolerating PO, transitioned back to Eliquis . Eliquis  10mg  BIDx7 days transitioned to 5mg  BID on 6/21, which she will continue.    #AKI (resolved)  Cr bump to 1.42 baseline around 0.9. suspect from IV diuresis, improving, now 1.07, cautious with fluids given RHF. S/p 500cc bolus on 6/19. Creatinine of day of discharge was 0.93.     # Iron  deficiency anemia  Iron  studies indicate she qualifies for iron  transfusion based on TSAT < 16%. Given 750mg  iron  during admission     #COPD - Tobacco use disorder, 65 pack year history  Last saw pulmonology in 04/2024. Underlying lung physiology a bit unclear as she has imaging findings of severe emphysematous lung changes but no obstructive pattern on spirometry; discussed continuing albuterol  PRN. No s/s COPD exacerbation.

## 2024-06-13 NOTE — Unmapped (Signed)
 ULTRASOUND PIV PROCEDURE NOTE    Indications:   Poor venous access.    Ultrasound guidance was necessary to obtain access.     Procedure Details:  Identity of the patient was confirmed via name, medical record number and date of birth. The availability of the correct equipment was verified.    The vein was identified and measured for ultrasound catheter insertion.       Vein measurement (without tourniquet):   0.38 cm   A(n) 20 gauge 1 catheter was selected based on the recommendations below:    [employer] Catheter/Vein Ratio Guidelines    Chart to determine PIV catheter size/length to use based on vein diameter and depth   Catheter Gauge Size (g)  22g 20g 18g   Catheter length (inches)  1.75 1.75 1.75   Catheter diameter measurement (mm) 0.9 mm 1.1 mm 1.3 mm          Minimum required vein diameter       Sonosite (cm)  0.27 cm 0.33 cm 0.39 cm          Maximum vein depth  1.25 cm 1.25 cm 1.25 cm          The field was prepared with necessary supplies and equipment.  Probe cover and sterile gel utilized. Insertion site was prepped with chlorhexidineand allowed to dry.  The catheter extension was primed with normal saline.  The [place] PIV was placed in the RAC with 1attempt(s). See LDA for additional details.    Catheter aspirated, 5 mL blood return present. The catheter was then flushed with 10 mL of normal saline. Insertion site cleansed, and dressing applied per manufacturer guidelines. The catheter was inserted with difficulty due to poor vasculature by Delton See, RN.    Thank you,     Delton See, RN    Ultrasound Resource Nurse    Workup / Procedure Time:  30 minutes

## 2024-06-13 NOTE — ED Notes (Signed)
 Daughter to nurses desk reporting she is taking her mother to [facility]. Daughter refuses to sign AMA form.

## 2024-06-13 NOTE — Code Documentation (Signed)
 CODE SEPSIS - PHARMACY COMMUNICATION  **Broad Spectrum Antibiotics should be administered within 1 hour of Sepsis diagnosis**  Time Code Sepsis Called/Page Received: 0911  Antibiotics Ordered: azithromycin, ceftriaxone , vancomycin   Time of 1st antibiotic administration: 0920  Additional action taken by pharmacy: none required  If necessary, Name of Provider/Nurse Contacted: N/A    Brittany Hernandez ,PharmD Clinical Pharmacist  06/13/2024  9:14 AM

## 2024-06-13 NOTE — ED Triage Notes (Signed)
 BIBA due to difficulty breathing.  Also stating her BP is high and her dr changed her medication on Tuesday and she has not had any relief since starting the new med.  Pt has CPOD. She has a wet cough and is able to cough stuff up.  EMS states 180/84 and all other vitals are normal

## 2024-06-14 LAB — CBC
HEMATOCRIT: 35 % (ref 34.0–44.0)
HEMOGLOBIN: 11.5 g/dL (ref 11.3–14.9)
MEAN CORPUSCULAR HEMOGLOBIN CONC: 32.9 g/dL (ref 32.0–36.0)
MEAN CORPUSCULAR HEMOGLOBIN: 27 pg (ref 25.9–32.4)
MEAN CORPUSCULAR VOLUME: 82.1 fL (ref 77.6–95.7)
MEAN PLATELET VOLUME: 7.6 fL (ref 6.8–10.7)
PLATELET COUNT: 269 10*9/L (ref 150–450)
RED BLOOD CELL COUNT: 4.26 10*12/L (ref 3.95–5.13)
RED CELL DISTRIBUTION WIDTH: 18.7 % — ABNORMAL HIGH (ref 12.2–15.2)
WBC ADJUSTED: 7.6 10*9/L (ref 3.6–11.2)

## 2024-06-14 LAB — BASIC METABOLIC PANEL
ANION GAP: 14 mmol/L (ref 5–14)
BLOOD UREA NITROGEN: 14 mg/dL (ref 9–23)
BUN / CREAT RATIO: 15
CALCIUM: 9.5 mg/dL (ref 8.7–10.4)
CHLORIDE: 100 mmol/L (ref 98–107)
CO2: 27 mmol/L (ref 20.0–31.0)
CREATININE: 0.93 mg/dL (ref 0.55–1.02)
EGFR CKD-EPI (2021) FEMALE: 62 mL/min/1.73m2 (ref >=60)
GLUCOSE RANDOM: 170 mg/dL (ref 70–179)
POTASSIUM: 3.5 mmol/L (ref 3.4–4.8)
SODIUM: 141 mmol/L (ref 135–145)

## 2024-06-14 LAB — MAGNESIUM: MAGNESIUM: 1.7 mg/dL (ref 1.6–2.6)

## 2024-06-14 LAB — PRO-BNP: PRO-BNP: 1016 pg/mL — ABNORMAL HIGH (ref <=300.0)

## 2024-06-14 MED ADMIN — magnesium oxide (MAG-OX) tablet 400 mg: 400 mg | ORAL | @ 15:00:00 | Stop: 2024-06-14

## 2024-06-14 MED ADMIN — ipratropium-albuterol (DUO-NEB) 0.5-2.5 mg/3 mL nebulizer solution 3 mL: 3 mL | RESPIRATORY_TRACT | @ 07:00:00

## 2024-06-14 MED ADMIN — ipratropium-albuterol (DUO-NEB) 0.5-2.5 mg/3 mL nebulizer solution 3 mL: 3 mL | RESPIRATORY_TRACT | @ 19:00:00

## 2024-06-14 MED ADMIN — dilTIAZem (CARDIZEM CD) 24 hr capsule 240 mg: 240 mg | ORAL | @ 13:00:00

## 2024-06-14 MED ADMIN — levoFLOXacin (LEVAQUIN) tablet 750 mg: 750 mg | ORAL | @ 05:00:00 | Stop: 2024-06-20

## 2024-06-14 MED ADMIN — apixaban (ELIQUIS) tablet 10 mg: 10 mg | ORAL | @ 13:00:00 | Stop: 2024-06-20

## 2024-06-14 MED ADMIN — iohexol (OMNIPAQUE) 350 mg iodine/mL solution 75 mL: 75 mL | INTRAVENOUS | @ 01:00:00 | Stop: 2024-06-13

## 2024-06-14 MED ADMIN — gabapentin (NEURONTIN) capsule 100 mg: 100 mg | ORAL | @ 05:00:00

## 2024-06-14 MED ADMIN — apixaban (ELIQUIS) tablet 10 mg: 10 mg | ORAL | @ 05:00:00 | Stop: 2024-06-20

## 2024-06-14 MED ADMIN — gabapentin (NEURONTIN) capsule 100 mg: 100 mg | ORAL | @ 18:00:00

## 2024-06-14 MED ADMIN — furosemide (LASIX) injection 60 mg: 60 mg | INTRAVENOUS | @ 05:00:00 | Stop: 2024-06-14

## 2024-06-14 MED ADMIN — potassium chloride (KLOR-CON) packet 40 mEq: 40 meq | ORAL | @ 18:00:00 | Stop: 2024-06-14

## 2024-06-14 MED ADMIN — ipratropium-albuterol (DUO-NEB) 0.5-2.5 mg/3 mL nebulizer solution 3 mL: 3 mL | RESPIRATORY_TRACT | @ 14:00:00

## 2024-06-14 MED ADMIN — furosemide (LASIX) injection 60 mg: 60 mg | INTRAVENOUS | @ 15:00:00 | Stop: 2024-06-14

## 2024-06-14 MED ADMIN — gabapentin (NEURONTIN) capsule 100 mg: 100 mg | ORAL | @ 13:00:00

## 2024-06-14 NOTE — Unmapped (Signed)
 Nursing Care Plan through 1300.    Shift Summary  Temperature remained stable with no signs of infection.    Skin assessment revealed scattered bruising and sacral wound.    Peripheral IV site was clean, dry, and intact with no intervention needed.    Magnesium oxide and furosemide were administered.    Overall, interventions were effective in maintaining stability and preventing falls or infections.     Absence of Infection Signs and Symptoms: Temperature remained stable at 36.9 ??C throughout the shift, indicating no fever or signs of infection. Contact precautions were consistently maintained to prevent infection spread.     Skin Health and Integrity: Skin assessment revealed scattered bruising and a sacral wound, with epidermis thin and loss of subcutaneous tissue noted. Frequent weight shifts were encouraged to optimize sensory and perceptual ability.     Absence of Fall and Fall-Related Injury: Fall risk interventions were consistently applied, including hourly visual checks, toilet scheduling, and bed alarms, with no falls or injuries reported.

## 2024-06-14 NOTE — Unmapped (Signed)
 PHYSICAL THERAPY  Evaluation (06/14/24 1115)          Patient Name:  Tammy Braun       Medical Record Number: [id number]   Date of Birth: 26-Mar-1941  Sex: Female        Post-Discharge Physical Therapy Recommendations:  PT Post Acute Discharge Recommendations: 3x weekly, Supervision for all mobility due to fall risk (vs 5x Low Intensity if pt does not have adequate caregiver support/availability)   Equipment Recommendation  PT DME Recommendations:  (TBD)          Treatment Diagnosis: Abnormalities of gait and mobility, Difficulty in walking        Activity Tolerance: Limited by fatigue, Limited by pain     ASSESSMENT  Problem List: Decreased mobility, Fall risk, Decreased endurance, Pain, Decreased safety awareness, Impaired balance, Decreased strength      Assessment : Corene Resnick is a 83 y.o. female with a PMH of  bladder cancer status post bladder removal with ilial conduit with urostomy, large paraesophageal hernia, pHTN, HTN, IIDM, COPD, CAD, anxiety/depression, CKD stage IIIa, recurrent UTIs, PAF not on anticoagulation,  presenting to the [facility] ED for shortness of breath.        Pt presents to skilled PT services with decreased strength, decreased endurance, and below baseline mobility, requiring max A for bed mobility, max A w/ RW for sit<>Stand transfer, max A +1-2 w/ RW bed<>BSC.  Pt reported B ankle pain at start of session; did not think she would be able to ambulate today, but wanted to try.  Pt reported ambulating short distances w/ RW at home w/ assistance at baseline, though has been using  manual WC more recently.  She lives w/ her daughter and grandson.  Per pt, daughter is retired and provides 24/7 assistance; is able to provide physical assistance needed for mobility.         Therapist reviewed findings of session, progressive mobility, and PT POC. Pt would benefit from continued skilled PT services to address safety, education and deficits. After review of contributing co-morbidities and personal factors, clinical presentation and exam findings, patient demonstrates mod complexity for evaluation and development of POC.        Recommend 3x/wk w/ 24/7 assistance w/ all mobility due to falls risk post-acute stay (vs 5x low intensity if pt does not have adequate caregiver support/availability).      Today's Interventions: Patient/Family/Caregiver Education, Therapeutic activity  Today's Interventions: PT eval, bed mobility, sit<>stand transfer, ambulation, monitoring of vitals, educated pt on findings of session and PT POC     Personal Factors/Comorbidities Present: 3+   Examination of Body System: Musculoskeletal, Cardiovascular, Activity/participation  Clinical Presentation: Evolving    Clinical Decision Making: Moderate        PLAN  Planned Frequency of Treatment: Plan of Care Initiated: 06/14/24  1-2x per day Weekly Frequency: 3-4 days per week  Planned Treatment Duration: 06/28/24     Planned Interventions: Education (Patient/Family/Caregiver), Gait training, Therapeutic Exercise, Therapeutic Activity, Self-care / Home Management training     Goals:         SHORT GOAL #1: Pt will perform all bed mobility with CGA               Time Frame : 2 weeks  SHORT GOAL #2: Pt will perform all functional transfers with Min A and LRAD              Time Frame : 2 weeks  SHORT GOAL #  3: Pt will ambulate >50' with Min A and LRAD              Time Frame : 2 weeks  SHORT GOAL #4: Pt will negotiate 4 steps with Min A and B rails               Time Frame : 2 weeks             Prognosis:        Barriers to Discharge: Endurance deficits, Functional strength deficits, Gait instability, Impaired Balance, Inability to safely perform ADLS     SUBJECTIVE        Patient reports: Agreeable to PT  Pain Comments: 7/10 pain in B ankles, 3/10 achy pain in lower L arm at rest  Medical Updates Since Last Visit/Relevant PMH Affecting Clinical Decision Making: Eval and Treat for deconditioning, shortness of breath with ambulation     Prior Functional Status: Pt reports ambulating short distances w/ RW at baseline w/ assistance, though has been using manual WC recently; needs assistance from family to negotiate stairs.  Pt has a tub shower, though it is not working and pt has been sponge bathing. Pt lives w/ daughter and grandson, daughter is retired and is available to help pt 24/7.  Pt reported that daughter is able to provide physical assistance w/ mobility.    Per chart: Per daughter, pt ambulates with RW in the home, cane on the stairs, and transport w/c when out in community.  Living Situation  Living Environment: House  Lives With: Daughter, Family (grandson)  Home Living: One level home, Stairs to enter with rails, Tub/shower unit, Shower chair with back, Grab bars in shower, Handicapped height toilet, Ramped entrance  Rail placement (outside): Bilateral rails in reach  Number of Stairs to Enter (outside): 4  Caregiver Identified?: Yes  Caregiver Availability: 24 hours  Caregiver Ability: Limited lifting  Caregiver Identified?: Yes   Equipment available at home: Rolling walker, Wheelchair-manual        Past Medical History[1]         Social History     Tobacco Use    Smoking status: Former     Current packs/day: 1.00     Average packs/day: 1 pack/day for 66.2 years (66.2 ttl pk-yrs)     Types: Cigarettes     Start date: 04/09/1958    Smokeless tobacco: Never    Tobacco comments:     10cpd   Substance Use Topics    Alcohol use: No     Alcohol/week: 0.0 standard drinks of alcohol       Past Surgical History[2]          Family History[3]     Allergies: Lisinopril, Losartan, and Hctz [hydrochlorothiazide]                  Objective Findings  Precautions / Restrictions  Precautions: Bleeding Precautions, Falls precautions  Weight Bearing Status: Non-applicable  Required Braces or Orthoses: Non-applicable        Equipment / Environment: Vascular access (PIV, TLC, Port-a-cath, PICC), Telemetry     Vitals/Orthostatics : At rest: 128/77, 80 bpm.  HR in 80's throughout session (per tele)     Cognition: WFL  Cognition comment: answered history taking questions and followed commands appropriately  Visual/Perception: Wears Glasses/Contacts all the time (glasses not present)  Hearing: Hearing aids not present, Hearing impairment (hears better out of R ear)           Upper  Extremities  UE ROM: Right WFL, Left WFL  UE Strength: Right WFL, Left WFL    Lower Extremities  LE ROM: Right WFL, Left WFL  LE Strength: Left Impaired/Limited, Right Impaired/Limited  RLE Strength Impairment: Reduced strength, Pain with movement  LLE Strength Impairment: Reduced strength, Pain with movement  LE comment: (seated) BLE knee ext 5/5, unable to hold resistance w/ PF, DF due to ankle pain          Sensation: Numbness, Impaired, Tingling (reported N/T in both legs at baseline)    Static Sitting-Level of Assistance: Supervision  Dynamic Sitting-Level of Assistance: Supervision  Sitting Balance comments: at EOB and at [facility]    Static Standing-Level of Assistance: Maximum assistance  Dynamic Standing - Level of Assistance: Maximum assistance  Standing Balance comments: w/ RW      Bed Mobility: Supine to Sit  Bed Mobility comments: Supine<>sit EOB with elevated HOB  and increased time/effort.  Pt required max manual A at trunk to obtain upright; mod manual A at BLE for placement and positioning; verbal cues for sequencing     Transfers: Sit to Stand  Sit to Stand assistance level: Maximum assist, patient does 25-49%  Transfer comments: Pt performed 2x Sit<>Stand with RW; required max manual A at trunk for hip clearance; verbal cues for sequencing and hand placement      Gait Level of Assistance: Maximum assist, patient does 25-49%, +1-2 assist   Gait Assistive Device: Rolling walker  Gait Distance Ambulated (ft): 2 ft    Skilled Treatment Performed: Bed >BSC with RW.  Pt took 3 steps to chair with RW and required increased time and effort. Pt required max manual assist at trunk to complete transfer; difficulty advancing BLE to step toward BSC.      On return from BSC>bed required +2 assistance w/ RW. Pt initiated step, though unable to complete; required max A at trunk to complete stand pivot transfer; second helper provided steadying assist. Verbal cues for sequencing.     Stairs: TBA            Endurance: Decreased, reported fatigue at end of session    Patient at end of session: All needs in reach, In bed, Lines intact, Notified Nurse, In crib, Staff present, Alarm activated (direct hand off to RN and CNA at end of session)    Physical Therapy Session Duration  PT Individual [mins]: 40          AM-PAC-6 click  Help currently need turning over In bed?: A Little - Minimal/Contact Guard Assist/Supervision  Help currently needed sitting down/standing up from chair with arms? : A lot - Maximum/Moderate Assistance  Help currently needed moving from supine to sitting on edge of bed?: A lot - Maximum/Moderate Assistance  Help currently needed moving to and from bed from wheelchair?: A lot - Maximum/Moderate Assistance  Help currently needed walking in a hospital room?: Unable to do/total assistance - Total Dependent Assist  Help currently needed climbing 3-5 steps with railing?: Unable to do/total assistance - Total Dependent Assist    Basic Mobility Score 6 click: 11    6 click Score (in points): % of Functional Impairment, Limitation, Restriction  6: 100% impaired, limited, restricted  7-8: At least 80%, but less than 100% impaired, limited restricted  9-13: At least 60%, but less than 80% impaired, limited restricted  14-19: At least 40%, but less than 60% impaired, limited restricted  20-22: At least 20%, but less than 40% impaired, limited restricted  23: At least 1%, but less than 20% impaired, limited restricted  24: 0% impaired, limited restricted    'AM-PAC' forms are Copyright protected by The Trustees of [facility]         I attest that I have reviewed the above information.  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    Chronic kidney disease     Depression, psychotic        Diabetes mellitus        in past    Emphysema of lung        Financial difficulties     Frail elderly     Hearing impairment     Hernia     History of transfusion     Hyperlipidemia     Hypertension     Impaired mobility     Osteoporosis     Pulmonary emphysema    05/08/2015    Visual impairment    [2]   Past Surgical History:  Procedure Laterality Date    ABDOMINAL SURGERY      BLADDER SURGERY      BREAST CYST EXCISION      CHEMOTHERAPY  2012    bladder    GALLBLADDER SURGERY      stone removal    ILEOSTOMY  2012    PR COLONOSCOPY FLX DX W/COLLJ SPEC WHEN PFRMD N/A 02/09/2015    Procedure: COLONOSCOPY, FLEXIBLE, PROXIMAL TO SPLENIC FLEXURE; DIAGNOSTIC, W/WO COLLECTION SPECIMEN BY BRUSH OR WASH;  Surgeon: Eleanor CHRISTELLA Sorrel, MD;  Location: [facility] [place];  Service: Gastroenterology    PR EXPLORATORY OF ABDOMEN N/A 02/28/2024    Procedure: EXPLORATORY LAPAROTOMY, EXPLORATORY CELIOTOMY WITH OR WITHOUT BIOPSY(S);  Surgeon: Hollice Toribio Birmingham, MD;  Location: [facility] OR [facility];  Service: General Surgery    PR RECONSTR TOTAL SHOULDER IMPLANT Left 04/08/2018    Procedure: ARTHROPLASTY, GLENOHUMERAL JOINT; TOTAL SHOULDER(GLENOID & PROXIMAL HUMERAL REPLACEMENT(EG, TOTAL SHOULDER);  Surgeon: Alease Carrie Oyster, MD;  Location: [facility] OR [facility];  Service: Ortho Sports Medicine    PR REOPEN RECENT ABD EXPLORATORY Midline 03/01/2024    Procedure: REOPENING OF RECENT LAPAROTOMY;  Surgeon: Vicci Janas CROME., MD;  Location: OR [facility];  Service: Trauma    PR REOPEN RECENT ABD EXPLORATORY N/A 03/03/2024    Procedure: REOPENING OF RECENT LAPAROTOMY;  Surgeon: Quinn Tinnie Verla Dasie, MD;  Location: OR [facility];  Service: Trauma    PR SIGMOIDOSCOPY,BIOPSY N/A 03/11/2015    Procedure: SIGMOIDOSCOPY, FLEXIBLE; WITH BIOPSY, SINGLE OR MULTIPLE;  Surgeon: Sandrea CHRISTELLA Mood, MD;  [facility];  Service: Gastroenterology    PR XCAPSL CTRC RMVL INSJ IO LENS PROSTH W/O ECP Left 06/05/2022    Procedure: EXTRACAPSULAR CATARACT REMOVAL W/INSERTION OF INTRAOCULAR LENS PROSTHESIS, MANUAL OR MECHANICAL TECHNIQUE WITHOUT ENDOSCOPIC CYCLOPHOTOCOAGULATION;  Surgeon: Sinthu Sivarama Ranjan, MD;  Location: [facility] OR [facility];  Service: Ophthalmology    PR XCAPSL CTRC RMVL INSJ IO LENS PROSTH W/O ECP Right 06/19/2022    Procedure: EXTRACAPSULAR CATARACT REMOVAL W/INSERTION OF INTRAOCULAR LENS PROSTHESIS, MANUAL OR MECHANICAL TECHNIQUE WITHOUT ENDOSCOPIC CYCLOPHOTOCOAGULATION;  Surgeon: Sinthu Sivarama Ranjan, MD;  [facility];  Service: Ophthalmology   [3]   Family History  Problem Relation Age of Onset    Breast cancer Daughter 27    Diabetes Mother     Glaucoma Father     Colon cancer Neg Hx     Ovarian cancer Neg Hx     Endometrial cancer Neg Hx     Anesthesia problems Neg Hx     Bleeding Disorder Neg Hx

## 2024-06-14 NOTE — Unmapped (Signed)
 Shift Summary  Furosemide was administered one-time during the shift.    Contact precautions were maintained throughout the shift to prevent infection.    The patient was repositioned every 2 hrs  Urostomy bag in place  Overall, the patient remained stable with no significant changes in comfort, infection signs, or skin integrity.     Optimal Comfort and Wellbeing: Pain levels remained at 0 throughout the shift, indicating effective pain management without the need for additional interventions.     Absence of Infection Signs and Symptoms: Temperature slightly decreased from 36.9 ??C to 36.7 ??C, remaining within normal limits, and contact precautions were consistently maintained.     Skin Health and Integrity: Bruising was noted with scattered locations, and a sacral wound and abdominal surgical incision were identified, but no interventions were needed for the peripheral IV site.

## 2024-06-14 NOTE — Unmapped (Signed)
 Family Medicine Inpatient Service  Progress Note    Team: [facility] [name] (pgr [phone])    Hospital Day: 1    ASSESSMENT / PLAN:   Tammy Braun is a 83 y.o. female with large paraesophageal hernia, severe pHTN, pAF p/w dyspnea.     #Dyspnea - RVF exacerbation 2/2 severe pHTN vs subacute PE vs multifocal pneumonia. [name] is a 66F with known PE (stopped AC prior to admission d/t c/f bleeding/falls), severe pHTN p/w two days of worsening dyspnea worse with lying flat and ambulating. History pertinent for productive cough, no wheezing, no chest pain, no infectious sx, worsened LE swelling. Afebrile, normotensive, no O2 requirement. No respiratory distress, no wheezing on exam, 1+ pitting edema. Workup so far with nml CBC/CMP/troponin. Elevated proBNP from baseline ~ 300s. Imaging demonstrating subacute PE (in new location compared to 02/2024 imaging), severe pHTN, multifocal pulmonary opacities c/f infection. Suspecting most of the symptoms are driven by RVF exacerbation with contribution of subacute PE that was not being treated. Less concerned for infectious process clinically despite imaging findings but will continue abx for now with low threshold to de-escalate. Plan  - PE: discussed restarting eliquis. Eliquis 10mg  BID x 7 days > Eliquis 5mg  BID  >> Consider further workup for source of PE as PVL negative including upper extremity PVL, malignancy given hx of, etc.  - RVF: Redose with lasix 60mg  in AM: recheck BMP/Mg in PM  >> Diuresis goal: can trend BNP or leg swelling/symptomatic. Hard to follow wt for her and not on oxygen.    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 Last saw pulmonology in 04/2024. Underlying lung physiology a bit unclear as she has imaging findings of severe emphysematous lung changes but no obstructive pattern on spirometry; discussed continuing albuterol PRN. No s/s COPD exacerbation  - Albuterol PRN ordered    #PHTN - Moderate TR. Last saw cardiology 11/2023. Appears to have evidence of severe pHTN dating back 05/2020 but treatment plan unclear for this.   - Diurese as above  - Pulm/Cardiology outpatient follow-up     #HTN.   - Continue home cardizem 240mg  every day     #Pressure Wound  - WOCN c/s    #Paroxysmal Atrial Fibrillation.   - Cont cardizem; started eliquis as above  - K > 4, Mg > 2    #Hypokalemia and HypoMag. Hx of pAFib.   - Replenish to goal K > 4, Mg > 2    #T2DM (A1c 5.6%, 03/2024)  - CTM BS    #CKD2. Stable  - Minimize nephrotoxic meds    # Checklist:  - IVF None  - Daily labs needed: CBC, BMP, and Magnesium  - Diet Regular  - Bowel Regimen: Senna 2 tabs qHS  - DVT: Continue home anticoagulation with Eliquis  - Code Status:   Orders Placed This Encounter   Procedures    Full Code     Standing Status:   Standing     Number of Occurrences:   1     - Dispo: Floor    [ ]  Anticipated Discharge Location: Home with home health  [ ]  PT/OT/DME: PT/OT ordered  [ ]  CM/SW needs: Likely pending PT/OT recs  [ ]  Follow up appt: Appointment needed    SUBJECTIVE:  Interval events:     Obtained collateral from daughter. We discussed Ms. Canlas's  hospitalization back in March for SBO.  Daughter mentions that the patient's blood pressure started going up 2 weeks ago and she was feeling jittery and dizzy.  They went to the emergency room but discharged from there.  They went and saw their PCP who upped the blood pressure medications (what appears to be diltiazem and aspirin).  Two days ago, her mom reported dyspnea that became more persistent. She went to [facility] ED first, imaging demonstrating PNA, PE. Spoke to PCP who told her to come to [facility].    This morning, patient reports feeling good. Breathing has improved.       REVIEW OF SYSTEMS:  Pertinent positives and negatives per HPI. A complete review of systems otherwise negative.    PHYSICAL EXAM:      Intake/Output Summary (Last 24 hours) at 06/14/2024 0550  Last data filed at 06/13/2024 2300  Gross per 24 hour   Intake --   Output 600 ml   Net -600 ml       Recent Vitals:  Vitals:    06/14/24 0336   BP:    Pulse: 59   Resp: 18   Temp:    SpO2: 97%       GEN: well appearing, lying in bed, NAD  HEENT: NCAT, No scleral icterus. Conjunctiva non-erythematous. MMM.  CV: Regular rate and rhythm. No murmurs/rubs/gallops.  Pulm: Normal work of breathing on RA. CTAB. No wheezing, crackles, or rhonchi.  Abd: Flat.  Nontender. No guarding, rebound.  Normoactive bowel sounds.    Neuro: A&O x 3. No focal deficits.  Ext: 1+ peripheral edema.  Palpable distal pulses.  Skin: No rashes or skin lesions.       LABS/ STUDIES:    All imaging, laboratory studies, and other pertinent tests including electrocardiography within the last 24 hours were reviewed and are summarized within the assessment and plan.     NUTRITION:                         Rory Kohl, MD, PGY3  June 14, 2024 5:50 AM

## 2024-06-15 LAB — CYSTATIN C
CYSTATIN C: 2.68 mg/L — ABNORMAL HIGH (ref 0.64–1.23)
EGFR CKD-EPI (2012) CYSTATIN C FEMALE: 18 mL/min/{1.73_m2} — ABNORMAL LOW (ref >=60)

## 2024-06-15 LAB — BASIC METABOLIC PANEL
ANION GAP: 15 mmol/L — ABNORMAL HIGH (ref 5–14)
ANION GAP: 16 mmol/L — ABNORMAL HIGH (ref 5–14)
BLOOD UREA NITROGEN: 19 mg/dL (ref 9–23)
BLOOD UREA NITROGEN: 21 mg/dL (ref 9–23)
BUN / CREAT RATIO: 14
BUN / CREAT RATIO: 17
CALCIUM: 9.6 mg/dL (ref 8.7–10.4)
CALCIUM: 9.9 mg/dL (ref 8.7–10.4)
CHLORIDE: 96 mmol/L — ABNORMAL LOW (ref 98–107)
CHLORIDE: 93 mmol/L — ABNORMAL LOW (ref 98–107)
CO2: 27.1 mmol/L (ref 20.0–31.0)
CO2: 27.9 mmol/L (ref 20.0–31.0)
CREATININE: 1.36 mg/dL — ABNORMAL HIGH (ref 0.55–1.02)
CREATININE: 1.23 mg/dL — ABNORMAL HIGH (ref 0.55–1.02)
EGFR CKD-EPI (2021) FEMALE: 39 mL/min/1.73m2 — ABNORMAL LOW (ref >=60)
EGFR CKD-EPI (2021) FEMALE: 44 mL/min/1.73m2 — ABNORMAL LOW (ref >=60)
GLUCOSE RANDOM: 197 mg/dL — ABNORMAL HIGH (ref 70–179)
GLUCOSE RANDOM: 284 mg/dL — ABNORMAL HIGH (ref 70–179)
POTASSIUM: 3.8 mmol/L (ref 3.4–4.8)
POTASSIUM: 4.2 mmol/L (ref 3.4–4.8)
SODIUM: 138 mmol/L (ref 135–145)
SODIUM: 137 mmol/L (ref 135–145)

## 2024-06-15 LAB — CBC
HEMATOCRIT: 34.3 % (ref 34.0–44.0)
HEMOGLOBIN: 11.1 g/dL — ABNORMAL LOW (ref 11.3–14.9)
MEAN CORPUSCULAR HEMOGLOBIN CONC: 32.4 g/dL (ref 32.0–36.0)
MEAN CORPUSCULAR HEMOGLOBIN: 26.6 pg (ref 25.9–32.4)
MEAN CORPUSCULAR VOLUME: 82.3 fL (ref 77.6–95.7)
MEAN PLATELET VOLUME: 7.7 fL (ref 6.8–10.7)
PLATELET COUNT: 252 10*9/L (ref 150–450)
RED BLOOD CELL COUNT: 4.17 10*12/L (ref 3.95–5.13)
RED CELL DISTRIBUTION WIDTH: 18.3 % — ABNORMAL HIGH (ref 12.2–15.2)
WBC ADJUSTED: 7.3 10*9/L (ref 3.6–11.2)

## 2024-06-15 LAB — MAGNESIUM
MAGNESIUM: 1.7 mg/dL (ref 1.6–2.6)
MAGNESIUM: 1.8 mg/dL (ref 1.6–2.6)

## 2024-06-15 MED ORDER — APIXABAN 5 MG TABLET
ORAL_TABLET | Freq: Two times a day (BID) | ORAL | 0 refills | 30.00000 days | Status: CP
Start: 2024-06-15 — End: 2024-06-15

## 2024-06-15 MED ADMIN — cyclobenzaprine (FLEXERIL) tablet 5 mg: 5 mg | ORAL | @ 20:00:00

## 2024-06-15 MED ADMIN — ipratropium-albuterol (DUO-NEB) 0.5-2.5 mg/3 mL nebulizer solution 3 mL: 3 mL | RESPIRATORY_TRACT | @ 13:00:00

## 2024-06-15 MED ADMIN — ipratropium-albuterol (DUO-NEB) 0.5-2.5 mg/3 mL nebulizer solution 3 mL: 3 mL | RESPIRATORY_TRACT | @ 19:00:00

## 2024-06-15 MED ADMIN — apixaban (ELIQUIS) tablet 10 mg: 10 mg | ORAL | @ 14:00:00 | Stop: 2024-06-20

## 2024-06-15 MED ADMIN — senna (SENOKOT) tablet 2 tablet: 2 | ORAL | @ 02:00:00

## 2024-06-15 MED ADMIN — ipratropium-albuterol (DUO-NEB) 0.5-2.5 mg/3 mL nebulizer solution 3 mL: 3 mL | RESPIRATORY_TRACT | @ 08:00:00

## 2024-06-15 MED ADMIN — ipratropium-albuterol (DUO-NEB) 0.5-2.5 mg/3 mL nebulizer solution 3 mL: 3 mL | RESPIRATORY_TRACT | @ 03:00:00

## 2024-06-15 MED ADMIN — magnesium oxide (MAG-OX) tablet 400 mg: 400 mg | ORAL | @ 16:00:00 | Stop: 2024-06-15

## 2024-06-15 MED ADMIN — gabapentin (NEURONTIN) capsule 100 mg: 100 mg | ORAL | @ 02:00:00

## 2024-06-15 MED ADMIN — gabapentin (NEURONTIN) capsule 100 mg: 100 mg | ORAL | @ 18:00:00

## 2024-06-15 MED ADMIN — apixaban (ELIQUIS) tablet 10 mg: 10 mg | ORAL | @ 02:00:00 | Stop: 2024-06-20

## 2024-06-15 MED ADMIN — dilTIAZem (CARDIZEM CD) 24 hr capsule 240 mg: 240 mg | ORAL | @ 14:00:00

## 2024-06-15 MED ADMIN — potassium chloride (KLOR-CON) packet 40 mEq: 40 meq | ORAL | @ 14:00:00 | Stop: 2024-06-15

## 2024-06-15 MED ADMIN — gabapentin (NEURONTIN) capsule 100 mg: 100 mg | ORAL | @ 14:00:00

## 2024-06-15 NOTE — Unmapped (Signed)
 General Pulmonary Team Initial Consult Note     Date of Service: 06/15/2024  Requesting Physician: Timothy Paul Daaleman, DO   Requesting Service: Family Medicine [facility])  Reason for consultation: Comprehensive evaluation of pulmonary hypertension.    Hospital Problems:  Principal Problem:    Multifocal pneumonia  Active Problems:    Hypertension    Tobacco use disorder    Hernia, diaphragmatic    Chronic renal disease, stage 2, mildly decreased glomerular filtration rate (GFR) between 60-89 mL/min/1.73 square meter    COPD (chronic obstructive pulmonary disease) with emphysema       Schizoaffective disorder       Pulmonary hypertension       History of bladder cancer    Atrial fibrillation, unspecified type       History of small bowel obstruction    S/P exploratory laparotomy    Subacute pulmonary embolism       History of MDR Pseudomonas aeruginosa infection    Chronic right-sided heart failure         HPI: Tammy Braun is a 83 y.o. female with COPD, restrictive lung disease, HFpEF, PHTN, PE admitted with worsening dyspnea.  History somewhat limited due to patient's dementia, hearing difficulties. She reports that up until her March hospitalization, she was able to walk despite her longstanding peripheral neuropathy.  She feels that her breathing has improved somewhat.  Her foot pain has improved with diuresis though still feels volume overloaded.  Cough productive of clear to light yellow sputum which is unchanged from baseline.  No wheezing.  Sometimes has chest tightness but this does not correlate with use of albuterol.  She uses albuterol when she feels short of breath which can sometimes happen at rest.  On average, uses about twice a day.  Says that knowing that kind people are taking care of her helps her to feel less short of breath.  At home, sleeps in a hospital bed with the head elevated.  Has not needed to increase this further.  Is not aware of any snoring, apneas.  She does occasionally wake up during the night with a gasp.  Is unsure if she needs oxygen when she sleeps.  No dysphagia, GERD.      She tells me she quit smoking though her note from Dr. Claudene on 05/19/2024 reports that she is smoking 1 pack/day and has smoked since the age of 73.  At home, her family helps her with her ADLs including getting to the toilet.  She is able to wash her face herself.  Otherwise, remains in bed.    On admission, was found to have a subacute PE in a new location as compared to 02/2024.  Team has attempted diuresis but developed an AKI after 1 dose of IV Lasix.  She was also found to have multifocal pulmonary opacities concerning for infection and isolated Pseudomonas.    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 Based on these problems, the patient has high risk of morbidity/mortality which is commensurate w their risk of management options described below in the recommendations.    Assessment      Interval Events: None       Impression: The patient has made minimal clinical progress since last encounter. I personally reviewed most recent pertinent labs, imaging and micro data, from 06/15/2024, which are noted in recommendations.   Patient with COPD/restrictive  lung disease as well as HFpEF and pulmonary hypertension (groups 2, 3, 4) admitted with dyspnea and found to have multifocal pseudomonal pneumonia.  Unclear whether the reported perfusion defect in the right lower lobe truly reflects new chronic thrombus versus vasoconstriction due to chronic atelectasis/volume loss from her diaphragmatic hernia.  Regardless, she has significant cardiopulmonary disease.  Her proBNP has actually improved from prior and her weight seems to be stable.  Suspect that she is actually euvolemic at this point.  Suspect that the pseudomonal infection is what tipped her already tenuous cardiopulmonary status.      Recommendations     -Will discuss her CTA imaging with radiology tomorrow early afternoon.  Continue anticoagulation.  - Okay to hold on further diuresis.  -She is not a candidate for heart transplant due to age and unlikely to have Group 1 PAH.  Would not propose right heart catheterization at this time.  However, if renal function continues to worsen, may be helpful in determining if she is euvolemic.  - Overnight oximetry, can be performed as an outpatient.  - Ongoing goals of care discussion with patient and family.  She repeatedly asked me am I going to make it despite me answering that her heart is very sick and that there are no specific therapies aside from treating the underlying disease to slow the possible progression.      The recommendations outlined in this note were discussed w the primary team direct (in-person). Please do not hesitate to page [phone] [facility] consult) with questions. We appreciate the opportunity to assist in the care of this patient. We look forward to following with you.    Attestation      I directly provided consultative services as documented in this note and I personally spent 90 minutes face-to-face and non face-to-face in the care of this patient, which includes all pre, intra and post visit time on the date of service including examining the patient, evaluating/interpreting objective clinical data, coordinating care with the entire multidisciplinary care team and developing a comprehensive management plan as outlined above. All documented time was specific to the E/M visit and does not include any procedures that may have been performed.      CHRISTELLA Anette Arlean Blondie, MD      Subjective & Objective     Vitals - past 24 hours  Temp:  [36.5 ??C (97.7 ??F)-36.7 ??C (98.1 ??F)] 36.7 ??C (98.1 ??F)  Pulse:  [69-79] 70  Resp:  [16-18] 18  BP: (118-128)/(65-73) 128/66  SpO2:  [92 %-97 %] 92 % Intake/Output  I/O last 3 completed shifts:  In: -   Out: 1650 [Urine:1650]      Pertinent exam findings:   General appearance - well appearing, well nourished, and in no distress  Eyes - anicteric sclerae, pink conjunctiva  Mouth - moist mucous membranes.  Multiple missing teeth  Neck - Trachea midline, normal neck movement  Lymphatics - not examined  Heart -regular rate and rhythm.  Systolic murmur with sharp S2 that I was unable to split appreciated throughout the entire precordium.  Chest -coarse breath sounds but no overt wheezing, rhonchi, rales.  Speaking in full sentences without difficulty.  Abdomen - soft, nontender, nondistended, no masses or organomegaly  Extremities - Trace lower extremity edema, no clubbing or cyanosis  Skin - normal coloration and turgor, no rashes, no suspicious skin lesions noted  Neurological -alert, pleasant, conversant, does not recall some facts such as seeing Dr. Claudene in clinic.  Wt  Readings from Last 12 Encounters:   06/15/24 62.1 kg (136 lb 14.5 oz)   06/09/24 65.8 kg (145 lb)   05/27/24 61.2 kg (135 lb)   03/29/24 68.5 kg (151 lb 0.2 oz)   02/26/24 61.2 kg (135 lb)   02/18/24 61.2 kg (135 lb)   02/10/24 62.7 kg (138 lb 3.2 oz)   01/31/24 61.2 kg (135 lb)   01/15/24 60.8 kg (134 lb 1.6 oz)   01/06/24 64.3 kg (141 lb 12.8 oz)   12/11/23 62 kg (136 lb 11.2 oz)   11/13/23 61.1 kg (134 lb 9.6 oz)      Relevant Imaging:  - CXR on 06/13/2024 revealed persistent but improved bibasilar airspace opacities and bilateral pleural effusions greatest on the left.  - CT chest PE protocol on 06/13/2024 revealed uUpper lung predominant emphysema.  Multifocal pulmonary opacities greatest in the left lung, concerning for infection.  Large posterior diaphragmatic hernia.  Patulous esophagus.  Radiology commented on subacute or chronic occlusive segmental right lower lobe pulmonary embolism but also has volume loss from diaphragmatic hernia.  Poor opacification of distal pulmonary artery branches in the left lower lobe likely reflects poor right heart output. Dilated right atrium and enlarged central pulmonary arteries reflecting known pulmonary hypertension.    - ECHO on 03/11/2024 revealed hyperdynamic LV systolic function with LVEF 70%.  Left atrium dilation.  RV moderately dilated with moderately reduced systolic function.  Severe pulmonary hypertension with estimated PASP of 96.  Right atrium dilated.      Arterial Blood Gas:   No results for input(s): SPECTYPEART, PHART, PCO2ART, PO2ART, HCO3ART, BEART, O2SATART in the last 24 hours.     Venous Blood Gas:   No results for input(s): PHVEN, PCO2VEN, PO2VEN, HCO3VEN, BEVEN, O2SATVEN in the last 24 hours.     Cultures:  Urine Culture, Comprehensive (no units)   Date Value   06/13/2024 Mixed Urogenital Flora     WBC (10*9/L)   Date Value   06/15/2024 7.3     WBC, UA (/HPF)   Date Value   06/13/2024 11 (H)          Other Labs:  Lab Results   Component Value Date    WBC 7.3 06/15/2024    HGB 11.1 (L) 06/15/2024    HCT 34.3 06/15/2024    PLT 252 06/15/2024     Lab Results   Component Value Date    NA 138 06/15/2024    K 3.8 06/15/2024    CL 96 (L) 06/15/2024    CO2 27.1 06/15/2024    BUN 19 06/15/2024    CREATININE 1.36 (H) 06/15/2024    GLU 197 (H) 06/15/2024    CALCIUM 9.6 06/15/2024    MG 1.7 06/15/2024    PHOS 3.1 04/13/2024     Lab Results   Component Value Date    BILITOT 0.3 06/13/2024    BILIDIR 0.10 03/22/2024    PROT 7.8 06/13/2024    ALBUMIN 3.3 (L) 06/13/2024    ALT <7 (L) 06/13/2024    AST 9 06/13/2024    ALKPHOS 93 06/13/2024    GGT 16 09/17/2011     Lab Results   Component Value Date    LABPROT 11.8 08/02/2013    INR 1.06 03/04/2024    APTT 27.5 03/29/2024     Lab Results   Component Value Date    PROBNP 1,016.0 (H) 06/13/2024    PROBNP 5,220.0 (H) 03/12/2024    PROBNP 5,983.0 (H) 03/03/2024  PROBNP 6,988.0 (H) 03/02/2024    PROBNP 338.0 07/26/2020    PROBNP 909.0 (H) 06/22/2020        Allergies & Home Medications   Personally reviewed in Epic    Continuous Infusions:   Infusions Meds[1]    Scheduled Medications:   Scheduled Medications[2]    PRN medications:  PRN Medications[3]           [1] [2]    apixaban  10 mg Oral BID    Followed by    NOREEN ON 06/20/2024] apixaban  5 mg Oral BID    dilTIAZem  240 mg Oral Daily    [Provider Hold] furosemide  20 mg Oral Daily    gabapentin  100 mg Oral TID    ipratropium-albuterol  3 mL Nebulization Q6H (RT)    [START ON 06/16/2024] levoFLOXacin  500 mg Oral Q48H    nicotine  1 patch Transdermal Daily    senna  2 tablet Oral Nightly   [3] acetaminophen, albuterol, cyclobenzaprine

## 2024-06-15 NOTE — Unmapped (Signed)
 WOCN Consult Services                                                 Wound Evaluation: Pressure Injury    Reason for Consult:   - Initial  - Ostomy Care  - Surgical Wound  - Urostomy    Problem List:   Principal Problem:    Multifocal pneumonia  Active Problems:    Hypertension    Tobacco use disorder    Hernia, diaphragmatic    Chronic renal disease, stage 2, mildly decreased glomerular filtration rate (GFR) between 60-89 mL/min/1.73 square meter    COPD (chronic obstructive pulmonary disease) with emphysema       Schizoaffective disorder       Pulmonary hypertension       History of bladder cancer    Atrial fibrillation, unspecified type       History of small bowel obstruction    S/P exploratory laparotomy    Subacute pulmonary embolism       History of MDR Pseudomonas aeruginosa infection    Chronic right-sided heart failure       Assessment: 83 y.o. female with large paraesophageal hernia, severe pHTN, pAF, COPD, RVF p/w dyspnea.     CWOCN consult re: sacral Stage 3 pressure injury, abdominal surgical wound and urostomy.  Patient has a healing Stage 3 pressure injury.  There is a midline surgical wound that is with mostly granulation tissue.  Patient has a urostomy-stoma is pink and budded with intact peristomal tissue.  The patient needed urostomy pouch changed.  Daughter left her 2 piece moldable Convatec pouches at bedside-WOCN changed pouch.    Wound 06/13/24 Abdomen Left;Lower (Active)   Properties   Placement Date 06/13/24   Placement Time 2330   Location Abdomen   Present on Original Admission Y   Wound Location Orientation Left;Lower      Assessments 06/15/2024  4:00 PM   Wound Image     Dressing Status      Changed   Wound Length (cm) 5 cm   Wound Width (cm) 2 cm   Wound Depth (cm) 2 cm   Wound Surface Area (cm^2) 7.85 cm^2   Wound Volume (cm^3) 10.472 cm^3   Peri-wound Assessment      Intact   Treatments Cleansed/Irrigation       Wound 06/13/24 Pressure Injury Sacrum Lower Stage 3 (Active)   Properties   Placement Date 06/13/24   Placement Time 2330   Location Sacrum   Present on Original Admission Y   Primary Wound Type Pressure Inj   Wound Location Orientation Lower   Wound Description (Comments) Stage 3      Assessments 06/15/2024  4:00 PM   Wound Image     Wound Length (cm) 2 cm   Wound Width (cm) 2 cm   Wound Depth (cm) 0.2 cm   Wound Surface Area (cm^2) 3.14 cm^2   Wound Volume (cm^3) 0.419 cm^3   Peri-wound Assessment      Intact   Pressure Injury Stage 3   Staged By WOCN yes   Dressing Silicone foam bordered dressing;Alginate   Dressing Changed Changed         Continence Status:   Incontinence of bladder: urostomy  Incontinent of bowel: pads    Moisture Associated Skin Damage:   - none noted  Risk Factors:   - Aging  - Friction/shear  - Immobility  - Moisture  - Multiple co-morbidities    Braden Scale Score: 17         Support Surface:   - Low Air Loss    Type Debridement Completed By KEANE:  N/A    Teaching:  - Moisture management  - Offloading  - Turning and repositioning  - Wound care    WOCN Recommendations:   - See nursing orders for wound care instructions.  - Contact WOCN with questions, concerns, or wound deterioration.  - Continue Skin Integrity Protocol.    Topical Therapy/Interventions:   - VASHE moistened dressings to abdominal wound.  Aquacel Ag and Silicone Foam to sacrum.  Use patient's ostomy supplies for urostomy.    Recommended Consults:  - Not Applicable    WOCN Follow Up:  - Weekly    Plan of Care Discussed With:   - Patient  - RN primary and Team    Supplies Ordered: No    Workup Time:   45 minutes    Delon Rummer, RN, BSN, [employer], [employer]  Wound Ostomy Consult Service

## 2024-06-15 NOTE — Unmapped (Signed)
 Family Medicine Inpatient Service  Progress Note    Team: Family Medicine [name] (pgr [phone])    Hospital Day: 2    ASSESSMENT / PLAN:   Tammy Braun is a 83 y.o. female with large paraesophageal hernia, severe pHTN, pAF, COPD, RVF p/w dyspnea.     #Dyspnea - RVF exacerbation 2/2 severe pHTN vs subacute PE vs multifocal pneumonia - AKI   Tammy Braun is a 49F with known PE (stopped AC prior to admission d/t c/f bleeding/falls), severe pHTN p/w two days of worsening dyspnea worse with lying flat and ambulating. Afebrile, normotensive, no O2 requirement. No respiratory distress, LCTA, 1+ pitting edema to knee. Workup so far with nml CBC/troponin. Elevated proBNP from baseline ~ 300s. Imaging demonstrating subacute PE (in new location compared to 02/2024 imaging), severe pHTN (group 2,3, 4) multifocal pulmonary opacities c/f infection. Suspecting most of the symptoms are driven by RVF exacerbation with contribution of subacute PE that was not being treated. Less concerned for infectious process clinically despite imaging findings but will continue abx for now with low threshold to de-escalate. Cr has bumped to 1.36 from .93 after IV lasix, ruling in for AKI, likely intrarenal iso IV diuretics.   Plan  - PE: Continue eliquis at PE dosing. Eliquis 10mg  BID x 7 days > Eliquis 5mg  BID  - RVF: Hold diuresis this morning d/t AKI; repeat BMP, Mg this afternoon  >> Diuresis goal: can trend BNP or leg swelling/symptomatic. Hard to follow wt for her and not on oxygen. Her swelling is unchanged at 1+ pitting to knee.  >> Daily standing wt, Strict I&O Q4  - Pneumonia (hx of MDR pseudomonas from bronchial cx): PO Levaquin dose reduced to 500mg  q48 hrs for 2 additional doses, 5 day total course    # AKI   New Cr bump to 1.36 baseline around 0.9. suspect from IV diuresis. Encourage po fluids, no IVF d/t her RVF  - follow up BMP, Mg     #COPD - Tobacco use disorder, 65 pack year history  Last saw pulmonology in 04/2024. Underlying lung physiology a bit unclear as she has imaging findings of severe emphysematous lung changes but no obstructive pattern on spirometry; discussed continuing albuterol PRN. No s/s COPD exacerbation.  - DuoNebs PRN  - Albuterol PRN   - O2 goal 88-92%    #PHTN - Moderate TR. Last saw cardiology 11/2023. Appears to have evidence of severe pHTN dating back 05/2020. She has likely rou 2,3,4 disease. Will discuss will pulmonology.   - Pulm consult inpatient  - Cardio follow-up outpatient recommended    #HTN.   - Continue home cardizem 240mg  every day     #Pressure Wound  - WOCN c/s    #Paroxysmal Atrial Fibrillation.   - Cont cardizem; started eliquis as above  - K > 4, Mg > 2    #Hypokalemia and HypoMag. Hx of pAFib.   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  - Minimize nephrotoxic meds, holding diuretics for now    # Checklist:  - IVF None  - Daily labs needed: CBC, BMP, and Magnesium  - Diet Regular  - Bowel Regimen: Senna 2 tabs qHS  - DVT: Restarting home anticoagulation with Eliquis  - Code Status:   Orders Placed This Encounter   Procedures    Full Code     Standing Status:  Standing     Number of Occurrences:   1     - Dispo: Floor    [ ]  Anticipated Discharge Location: Home with home health  [ ]  PT/OT/DME: PT/OT ordered  [ ]  CM/SW needs: Likely pending PT/OT recs  [ ]  Follow up appt: Appointment needed    SUBJECTIVE:  Interval events:     This morning, patient reports feeling good. Breathing has improved. She worked with PT yesterday and was able to get out of bed. Plan to talk more with daughter when she comes by later today re goals of care and plans going forward.      REVIEW OF SYSTEMS:  Pertinent positives and negatives per HPI. A complete review of systems otherwise negative.    PHYSICAL EXAM:      Intake/Output Summary (Last 24 hours) at 06/15/2024 1016  Last data filed at 06/14/2024 1530  Gross per 24 hour   Intake --   Output 750 ml   Net -750 ml       Recent Vitals:  Vitals:    06/15/24 0845   BP: 128/66   Pulse: 70   Resp: 18   Temp: 36.7 ??C (98.1 ??F)   SpO2: 92%       GEN: well appearing, lying in bed, NAD, hard of hearing  HEENT: NCAT, No scleral icterus. Conjunctiva non-erythematous. MMM.  CV: Regular rate. 2/6 systolic murmur. Rubs/gallops.   Pulm: Normal work of breathing on RA. CTAB. No wheezing, crackles, or rhonchi.  Abd: Flat.  Nontender. No guarding, rebound.  Normoactive bowel sounds.  Urostomy producing clear yellow urine, ostomy site looks clean and intact.  Neuro: A&O x 3. No focal deficits.  Ext: 1+ peripheral edema.  Palpable distal pulses.  Skin: No rashes or skin lesions.     LABS/ STUDIES:    All imaging, laboratory studies, and other pertinent tests including electrocardiography within the last 24 hours were reviewed and are summarized within the assessment and plan.     NUTRITION:     Tammy Braun, MS4  [facility]     I attest that I have reviewed the student note and that the components of the history of the present illness, the physical exam, and the assessment and plan documented were performed by me or were performed in my presence by the student where I verified the documentation and performed (or re-performed) the exam and medical decision making.     Tammy Freiberg MD PGY2  [facility]

## 2024-06-15 NOTE — Unmapped (Signed)
 Inpatient Tobacco Cessation Counseling Note    This medical encounter was conducted virtually using [email or web address]  [email or web address] protocols.    I have identified myself to the patient and conveyed my credentials to Ms. Batchelder  I have explained the capabilities and limitations of telemedicine and the patient/proxy and myself both agree that it is appropriate for their current circumstances/symptoms.     Contact Information  Person Contacted: Shayne Deerman         Contact Phone number: [phone]      Phone Outcome: spoke with pt  Is there someone else in the room? Yes. What is your relationship? grandchild. Do you want this person here for the visit? yes.    Patient's location at the time of the telephone visit: Hospitalized at [facility]   Provider's location at the time of the telephone visit: At home, in [place]       Purpose of contact:     Tobacco Treatment Team (TTP) received inpatient consult from [facility] for Tammy Braun, 83 y.o. female. Pt participated in a telephone visit for tobacco cessation counseling.  Patient was admitted to hospital for Schizophrenia, schizo-affective type   [F25.9]  Pressure injury of skin, unspecified injury stage, unspecified location [L89.90]. Patient consented to telephone visit given due to social isolation measures in place due to the COVID-19 pandemic.     Tobacco Use History and Assessment  Time Since Last Tobacco Use: 1 to 7 days ago  Tobacco Withdrawal (Past 24 Hours): None noted  Type of Tobacco Products Used: Cigarettes  Quantity Used: 10  Quantity Per: day  Longest Time Without Tobacco: Months  # of Hours, Days, Weeks, Months or Years: 2  Most Recent Attempt: Currently working on    [employer] Assessment  Why Uses: 1. habit 2. stress-relief  Reasons to Become Tobacco Free: health  Barriers/Challenges: 1. long-standing habit 2. stress  Strategies: pt can use 1. NRT to manage cravings 2. hand-to-mouth replacements    NOTE: Pt was polite and brief in conversation, reporting it has been 2 months since she has had a cigarette. Pt's H&P suggests pt had a relapse recently. Pt denies having a cigarette in the last 2 months and states she has no needs regarding cessation, that she is confident in her ability to stay quit. Pt declined nicotine patch and gum/lozenge. SW offered encouragement and reviewed health benefits specifically in context of lung health as imaging suggests pt has emphysema. Pt ended conversation abruptly when MD came to the room and declined follow-up call. SW provided pt with contact information, physical improvements related to tobacco cessation, and available resources (including outpatient Tobacco Treatment Program at [facility] and [place] [facility]).    Treatment Plan  Please see below for medication recommendations in bold.   Cessation Meds Currently Using: None  Medications Recommended During Hospitalization: Patient declines  Outpatient/Discharge Medications Recommended: Patient declines  Patient's Plan Post Discharge/Visit: Stay quit    As part of this Telephone Visit, no in-person exam was conducted.     I personally spent 6 minutes counseling the patient via telephone about tobacco cessation.  I spent an additional 7 minutes on pre- and post-visit activities.      The patient was physically located in [place]  or a state in which I am permitted to provide care. The patient and/or parent/guardian understood that s/he may incur co-pays and cost sharing, and agreed to the telemedicine visit. The visit was reasonable and appropriate under the circumstances given the patient's  presentation at the time.     The patient and/or parent/guardian has been advised of the potential risks and limitations of this mode of treatment (including, but not limited to, the absence of in-person examination) and has agreed to be treated using telemedicine. The patient's/patient's family's questions regarding telemedicine have been answered.      If the visit was completed in an ambulatory setting, the patient and/or parent/guardian has also been advised to contact their provider???s office for worsening conditions, and seek emergency medical treatment and/or call 911 if the patient deems either necessary.     Visit Format/Coding: Telephone     Coding: 01033 (5-10 minutes)  Service rendered over the phone most consistent with: Tobacco cessation counseling, greater than 3 minutes ([id number])      Mliss Fair, LCSW, NCTTP  Clinical Social Worker / Tobacco Treatment Specialist  [facility]  Phone: [phone]

## 2024-06-15 NOTE — Unmapped (Signed)
 Shift Summary  Positioning supports and frequent weight shifts were consistently utilized to maintain skin integrity.   Senna was administered at 9:48 PM.   Patient consistently declined repositioning throughout the shift.   Temperature remained stable at 36.7 ??C (98.1 ??F), indicating no fever.   Overall, the patient required assistance for daily activities but maintained stable vitals and skin integrity.     Absence of Infection Signs and Symptoms: Temperature remained stable at 36.7 ??C (98.1 ??F) throughout the shift, indicating no fever or signs of infection.     Skin Health and Integrity: Positioning supports and frequent weight shifts were consistently utilized, and adhesive use was limited to protect skin integrity.     Improved Ability to Complete Activities of Daily Living: Assistance was consistently required for bathing and general hygiene, with minimal assist provided.     Absence of Fall and Fall-Related Injury: Hourly visual checks and toilet assistance every two hours were maintained, with no falls or injuries reported.

## 2024-06-16 LAB — BASIC METABOLIC PANEL
ANION GAP: 14 mmol/L (ref 5–14)
BLOOD UREA NITROGEN: 25 mg/dL — ABNORMAL HIGH (ref 9–23)
BUN / CREAT RATIO: 18
CALCIUM: 9.6 mg/dL (ref 8.7–10.4)
CHLORIDE: 95 mmol/L — ABNORMAL LOW (ref 98–107)
CO2: 26.6 mmol/L (ref 20.0–31.0)
CREATININE: 1.42 mg/dL — ABNORMAL HIGH (ref 0.55–1.02)
EGFR CKD-EPI (2021) FEMALE: 37 mL/min/1.73m2 — ABNORMAL LOW (ref >=60)
GLUCOSE RANDOM: 220 mg/dL — ABNORMAL HIGH (ref 70–179)
POTASSIUM: 4.8 mmol/L (ref 3.5–5.1)
SODIUM: 136 mmol/L (ref 135–145)

## 2024-06-16 LAB — CBC
HEMATOCRIT: 35.2 % (ref 34.0–44.0)
HEMOGLOBIN: 11.4 g/dL (ref 11.3–14.9)
MEAN CORPUSCULAR HEMOGLOBIN CONC: 32.4 g/dL (ref 32.0–36.0)
MEAN CORPUSCULAR HEMOGLOBIN: 26.5 pg (ref 25.9–32.4)
MEAN CORPUSCULAR VOLUME: 82 fL (ref 77.6–95.7)
MEAN PLATELET VOLUME: 7.8 fL (ref 6.8–10.7)
PLATELET COUNT: 292 10*9/L (ref 150–450)
RED BLOOD CELL COUNT: 4.29 10*12/L (ref 3.95–5.13)
RED CELL DISTRIBUTION WIDTH: 18.5 % — ABNORMAL HIGH (ref 12.2–15.2)
WBC ADJUSTED: 8.5 10*9/L (ref 3.6–11.2)

## 2024-06-16 LAB — FERRITIN: FERRITIN: 155.9 ng/mL (ref 7.3–270.7)

## 2024-06-16 LAB — MAGNESIUM: MAGNESIUM: 1.9 mg/dL (ref 1.6–2.6)

## 2024-06-16 LAB — IRON PANEL
IRON SATURATION (CALC): 7 %
IRON: 13 ug/dL — ABNORMAL LOW (ref 50–170)
TOTAL IRON BINDING CAPACITY (CALC): 180.2 ug/dL — ABNORMAL LOW (ref 250.0–425.0)
TRANSFERRIN: 143 mg/dL — ABNORMAL LOW (ref 250.0–380.0)

## 2024-06-16 MED ADMIN — magnesium oxide (MAG-OX) tablet 400 mg: 400 mg | ORAL | @ 16:00:00 | Stop: 2024-06-16

## 2024-06-16 MED ADMIN — cyclobenzaprine (FLEXERIL) tablet 5 mg: 5 mg | ORAL | @ 05:00:00

## 2024-06-16 MED ADMIN — ipratropium-albuterol (DUO-NEB) 0.5-2.5 mg/3 mL nebulizer solution 3 mL: 3 mL | RESPIRATORY_TRACT | @ 08:00:00

## 2024-06-16 MED ADMIN — acetaminophen (TYLENOL) tablet 650 mg: 650 mg | ORAL | @ 13:00:00

## 2024-06-16 MED ADMIN — levoFLOXacin (LEVAQUIN) tablet 500 mg: 500 mg | ORAL | @ 10:00:00 | Stop: 2024-06-20

## 2024-06-16 MED ADMIN — polyethylene glycol (MIRALAX) packet 17 g: 17 g | ORAL | @ 22:00:00

## 2024-06-16 MED ADMIN — gabapentin (NEURONTIN) capsule 100 mg: 100 mg | ORAL | @ 13:00:00

## 2024-06-16 MED ADMIN — apixaban (ELIQUIS) tablet 10 mg: 10 mg | ORAL | @ 13:00:00 | Stop: 2024-06-20

## 2024-06-16 MED ADMIN — cyclobenzaprine (FLEXERIL) tablet 5 mg: 5 mg | ORAL | @ 20:00:00

## 2024-06-16 MED ADMIN — ipratropium-albuterol (DUO-NEB) 0.5-2.5 mg/3 mL nebulizer solution 3 mL: 3 mL | RESPIRATORY_TRACT | @ 01:00:00

## 2024-06-16 MED ADMIN — gabapentin (NEURONTIN) capsule 100 mg: 100 mg | ORAL

## 2024-06-16 MED ADMIN — acetaminophen (TYLENOL) tablet 650 mg: 650 mg | ORAL | @ 20:00:00

## 2024-06-16 MED ADMIN — ipratropium-albuterol (DUO-NEB) 0.5-2.5 mg/3 mL nebulizer solution 3 mL: 3 mL | RESPIRATORY_TRACT | @ 20:00:00

## 2024-06-16 MED ADMIN — dilTIAZem (CARDIZEM CD) 24 hr capsule 240 mg: 240 mg | ORAL | @ 13:00:00

## 2024-06-16 MED ADMIN — ipratropium-albuterol (DUO-NEB) 0.5-2.5 mg/3 mL nebulizer solution 3 mL: 3 mL | RESPIRATORY_TRACT | @ 16:00:00

## 2024-06-16 MED ADMIN — apixaban (ELIQUIS) tablet 10 mg: 10 mg | ORAL | Stop: 2024-06-20

## 2024-06-16 MED ADMIN — sodium ferric gluconate (FERRLECIT) 250 mg in sodium chloride (NS) 0.9 % 100 mL IVPB: 250 mg | INTRAVENOUS | @ 20:00:00 | Stop: 2024-06-17

## 2024-06-16 MED ADMIN — senna (SENOKOT) tablet 2 tablet: 2 | ORAL

## 2024-06-16 MED ADMIN — acetaminophen (TYLENOL) tablet 650 mg: 650 mg | ORAL | @ 05:00:00 | Stop: 2024-06-16

## 2024-06-16 NOTE — Unmapped (Signed)
 Shift Summary  LevoFLOXacin was administered to address infection concerns.   The sacrum pressure injury dressing was changed and cleansed.   Toileting was performed every two hours to prevent falls.   Cyclobenzaprine and acetaminophen were administered for pain and muscle spasms.   Overall, the patient remained stable with no falls or significant changes in condition.     Absence of Infection Signs and Symptoms: Contact precautions were consistently maintained throughout the shift, and levoFLOXacin was administered to address infection concerns.     Skin Health and Integrity: The sacrum pressure injury dressing was changed and cleansed, with frequent weight shifts encouraged to reduce pressure.     Improved Ability to Complete Activities of Daily Living: Assistance with general hygiene fluctuated between minimal and moderate, while bathing consistently required assistance.     Absence of Fall and Fall-Related Injury: Toileting was performed every two hours, and hourly visual checks confirmed the patient remained in bed without incidents.

## 2024-06-16 NOTE — Unmapped (Signed)
 Discussion Date:  June 16, 2024     Patient has decisional capacity:  Yes     Patient has selected a Health Care Decision-Maker if loses capacity: Yes    We held an Advance Care Planning meeting with [name] and her daughter, Tammy Braun, who is her HCDM. Dr. Demaris Freiberg, MD, PGY-2, and Marce Sleet, MS4.     Communication of Medical Status/Prognosis:   Patient has severe pulmonary hypertension (group 2, 3, 4). Discussed that patient is not a candidate for heart or lung transplant given her age and multifactorial disease processes. Discussed that this is a chronic disease that will continue to worsen over time. Discussed risks associated with CPR and intubation, including that she likely will not return to her baseline level of function.      Communication of Treatment Goals/Options:   There are no disease-modifying therapies that we can offer to Tammy Braun for her severe pulmonary hypertension. She is getting daily lasix to help with symptom management and medicines to help control her blood pressure, but these will not reverse or improve her heart or lung function. We discussed the risks of CPR and intubation in the setting of her advanced disease.     Treatment Decisions:    Family desires to continue to treat patient with all available measures. Daughter desires patient to be full code including chest compressions and intubation.        Coletee Cambey MS4    I attest that I have reviewed the student note and that the components of the history of the present illness, the physical exam, and the assessment and plan documented were performed by me or were performed in my presence by the student where I verified the documentation and performed (or re-performed) the exam and medical decision making.     Demaris Freiberg MD PGY2  [facility]

## 2024-06-16 NOTE — Unmapped (Signed)
 ULTRASOUND PIV PROCEDURE NOTE    Indications:   Poor venous access.    Ultrasound guidance was necessary to obtain access.     Procedure Details:  Identity of the patient was confirmed via name, medical record number and date of birth. The availability of the correct equipment was verified.    The vein was identified and measured for ultrasound catheter insertion.       Vein measurement (without tourniquet):   0.37  cm   A(n) 20 gauge 1.75 catheter was selected based on the recommendations below:    [facility] Catheter/Vein Ratio Guidelines    Chart to determine PIV catheter size/length to use based on vein diameter and depth   Catheter Gauge Size (g)  22g 20g 18g   Catheter length (inches)  1.75 1.75 1.75   Catheter diameter measurement (mm) 0.9 mm 1.1 mm 1.3 mm          Minimum required vein diameter       Sonosite (cm)  0.27 cm 0.33 cm 0.39 cm          Maximum vein depth  1.25 cm 1.25 cm 1.25 cm          The field was prepared with necessary supplies and equipment.  Probe cover and sterile gel utilized. Insertion site was prepped with chlorhexidine and allowed to dry.  The catheter extension was primed with normal saline.  The US  PIV was placed in the LAC with 1attempt(s). See LDA for additional details.    Catheter aspirated, 3 mL blood return present. The catheter was then flushed with 10 mL of normal saline. Insertion site cleansed, and dressing applied per manufacturer guidelines. The catheter was inserted without difficulty by Adine Negri, RN.    Thank you,     Adine Negri, RN    Ultrasound Resource Nurse    Workup / Procedure Time:  30 minutes

## 2024-06-16 NOTE — Unmapped (Signed)
 Reviewed chest CT imaging with radiology.  Confirmed that the thrombus in the LUL has completely resolved.  As compared to March and prior CTs, does appear that she has a new occlusive thrombus in the RLL.  Likely tolerating this well because of pre-existing shunting due to atelectasis from her diaphragmatic hernia.      Recommend lifelong anticoagulation given two separate PE episodes.  Also should get overnight oximetry done on room air to determine if desaturating when sleeping as this will worsen her pre-existing PHTN.    Will sign off but please reach out if questions arise during hospitalization.

## 2024-06-16 NOTE — Unmapped (Signed)
 Attempted to reach pt/daughter with follow up information from PCP. No answer.

## 2024-06-16 NOTE — Unmapped (Signed)
 Shift Summary  Pain levels initially improved but later increased, requiring intervention with cyclobenzaprine.    Patient refused gabapentin, impacting pain management strategy.    Sodium ferric gluconate was administered as part of ongoing treatment.    Unplanned readmission score showed a slight decrease by the end of the shift.    Overall, the patient is stable with expected discharge to [facility].     Absence of Hospital-Acquired Illness or Injury: Skin integrity showed bruising, but no new interventions were needed, and IV sites remained clean, dry, and intact throughout the shift.     Optimal Comfort and Wellbeing: Pain levels fluctuated, initially improving to zero but later increasing to eight in the rib cage area; cyclobenzaprine was administered for muscle spasms, resulting in a calm response.     Readiness for Transition of Care: The unplanned readmission score slightly decreased by the end of the shift, and the expected discharge disposition remains Home Health Services.

## 2024-06-16 NOTE — Unmapped (Signed)
 Family Medicine Inpatient Service  Progress Note    Team: Family Medicine [name] (pgr [phone])    Hospital Day: 3    ASSESSMENT / PLAN:   Tammy Braun is a 83 y.o. female with large paraesophageal hernia, severe pHTN, pAF, COPD, RVF p/w dyspnea.     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 We diuresed her with IV lasix and she developed an AKI. We held her home furosemide and have not diuresed further. She now appears euvolemic. Cr still elevated. Per family patient is quite deconditioned from her baseline and is concerned re taking care of her at home.   Plan  - PE: Continue eliquis at PE dosing. 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 suspect from IV diuresis. Encourage po fluids, no IVF d/t her RVF  - Follow up BMP, Mg    # Iron deficiency anemia  Iron studies indicate she qualifies for iron transfusion based on TSAT < 16%.  - IV iron 250 mg for 3 doses    #PHTN - Moderate TR. Last saw cardiology 11/2023. Appears to have evidence of severe pHTN dating back 05/2020. 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 Will discuss will pulmonology.   - Pulm consult inpatient  - Cardio follow-up outpatient recommended    #COPD - Tobacco use disorder, 65 pack year history  Last saw pulmonology in 04/2024. Underlying lung physiology a bit unclear as she has imaging findings of severe emphysematous lung changes but no obstructive pattern on spirometry; discussed continuing albuterol PRN. No s/s COPD exacerbation.  - DuoNebs PRN  - Albuterol PRN   - O2 goal 88-92%    #HTN  - Continue home cardizem 240mg  every day     #Pressure Wound  - WOCN c/s    #Paroxysmal Atrial Fibrillation.   - Cont cardizem; started eliquis as above  - K > 4, Mg > 2    #Hypokalemia and HypoMag. Hx of pAFib.   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  - Minimize nephrotoxic meds, holding diuretics for now    # Checklist:  - IVF None  - Daily labs needed: CBC, BMP, and Magnesium  - Diet Regular  - Bowel Regimen: Senna 2 tabs qHS  - DVT: Restarting home anticoagulation with Eliquis  - Code Status:   Orders Placed This Encounter   Procedures    Full Code     Standing Status:   Standing  Number of Occurrences:   1     - Dispo: Floor    [ ]  Anticipated Discharge Location: Home with home health  [ ]  PT/OT/DME: PT/OT ordered  [ ]  CM/SW needs: Likely pending PT/OT recs  [ ]  Follow up appt: Appointment needed    SUBJECTIVE:  Interval events:     This morning, patient reports feeling good. Breathing has improved. Her feet are not hurting her this morning. Daughter is concerned that patient is not at her baseline and worried about her ability to take care of her at home.     REVIEW OF SYSTEMS:  Pertinent positives and negatives per HPI. A complete review of systems otherwise negative.    PHYSICAL EXAM:      Intake/Output Summary (Last 24 hours) at 06/16/2024 1001  Last data filed at 06/16/2024 0630  Gross per 24 hour   Intake 120 ml   Output 1100 ml   Net -980 ml       Recent Vitals:  Vitals:    06/16/24 0915   BP:    Pulse: 79   Resp:    Temp:    SpO2:        GEN: well appearing, lying in bed, NAD, hard of hearing  HEENT: NCAT, No scleral icterus. Conjunctiva non-erythematous. MMM.  CV: Regular rate. 2/6 systolic murmur. Rubs/gallops.   Pulm: Normal work of breathing on RA. CTAB. No wheezing, crackles, or rhonchi.  Abd: Flat.  Nontender. No guarding, rebound.  Normoactive bowel sounds.  Urostomy producing clear yellow urine, ostomy site looks clean and intact.  Neuro: A&O x 3. No focal deficits.  Ext: No edema.  Palpable distal pulses.  Skin: No rashes or skin lesions.     LABS/ STUDIES:    All imaging, laboratory studies, and other pertinent tests including electrocardiography within the last 24 hours were reviewed and are summarized within the assessment and plan.     NUTRITION:     Tammy Braun, MS4  [facility]     I attest that I have reviewed the student note and that the components of the history of the present illness, the physical exam, and the assessment and plan documented were performed by me or were performed in my presence by the student where I verified the documentation and performed (or re-performed) the exam and medical decision making.     Demaris Freiberg MD PGY2  [facility]

## 2024-06-17 LAB — MAGNESIUM: MAGNESIUM: 2.1 mg/dL (ref 1.6–2.6)

## 2024-06-17 LAB — BASIC METABOLIC PANEL
ANION GAP: 14 mmol/L (ref 5–14)
BLOOD UREA NITROGEN: 21 mg/dL (ref 9–23)
BUN / CREAT RATIO: 17
CALCIUM: 9.6 mg/dL (ref 8.7–10.4)
CHLORIDE: 94 mmol/L — ABNORMAL LOW (ref 98–107)
CO2: 25.2 mmol/L (ref 20.0–31.0)
CREATININE: 1.27 mg/dL — ABNORMAL HIGH (ref 0.55–1.02)
EGFR CKD-EPI (2021) FEMALE: 43 mL/min/1.73m2 — ABNORMAL LOW (ref >=60)
GLUCOSE RANDOM: 182 mg/dL — ABNORMAL HIGH (ref 70–179)
POTASSIUM: 4.9 mmol/L — ABNORMAL HIGH (ref 3.4–4.8)
SODIUM: 133 mmol/L — ABNORMAL LOW (ref 135–145)

## 2024-06-17 LAB — CBC
HEMATOCRIT: 32.9 % — ABNORMAL LOW (ref 34.0–44.0)
HEMOGLOBIN: 10.9 g/dL — ABNORMAL LOW (ref 11.3–14.9)
MEAN CORPUSCULAR HEMOGLOBIN CONC: 33.2 g/dL (ref 32.0–36.0)
MEAN CORPUSCULAR HEMOGLOBIN: 26.9 pg (ref 25.9–32.4)
MEAN CORPUSCULAR VOLUME: 81.1 fL (ref 77.6–95.7)
MEAN PLATELET VOLUME: 7.7 fL (ref 6.8–10.7)
PLATELET COUNT: 260 10*9/L (ref 150–450)
RED BLOOD CELL COUNT: 4.06 10*12/L (ref 3.95–5.13)
RED CELL DISTRIBUTION WIDTH: 18.4 % — ABNORMAL HIGH (ref 12.2–15.2)
WBC ADJUSTED: 8.8 10*9/L (ref 3.6–11.2)

## 2024-06-17 MED ORDER — APIXABAN 5 MG TABLET
ORAL_TABLET | ORAL | 0 refills | 34.00000 days
Start: 2024-06-17 — End: 2024-07-21

## 2024-06-17 MED ORDER — POLYETHYLENE GLYCOL 3350 17 GRAM ORAL POWDER PACKET
PACK | Freq: Every evening | ORAL | 2 refills | 30.00000 days
Start: 2024-06-17 — End: 2024-09-15

## 2024-06-17 MED ORDER — SENNOSIDES 8.6 MG TABLET
ORAL_TABLET | Freq: Every evening | ORAL | 2 refills | 30.00000 days
Start: 2024-06-17 — End: 2024-09-15

## 2024-06-17 MED ORDER — NICOTINE 14 MG/24 HR DAILY TRANSDERMAL PATCH
MEDICATED_PATCH | Freq: Every day | TRANSDERMAL | 1 refills | 28.00000 days | Status: CN
Start: 2024-06-17 — End: ?

## 2024-06-17 MED ADMIN — diclofenac sodium (VOLTAREN) 1 % gel 2 g: 2 g | TOPICAL | @ 16:00:00

## 2024-06-17 MED ADMIN — gabapentin (NEURONTIN) capsule 100 mg: 100 mg | ORAL | @ 01:00:00

## 2024-06-17 MED ADMIN — ipratropium-albuterol (DUO-NEB) 0.5-2.5 mg/3 mL nebulizer solution 3 mL: 3 mL | RESPIRATORY_TRACT | @ 09:00:00

## 2024-06-17 MED ADMIN — sodium ferric gluconate (FERRLECIT) 250 mg in sodium chloride (NS) 0.9 % 100 mL IVPB: 250 mg | INTRAVENOUS | @ 01:00:00 | Stop: 2024-06-17

## 2024-06-17 MED ADMIN — gabapentin (NEURONTIN) capsule 100 mg: 100 mg | ORAL | @ 12:00:00

## 2024-06-17 MED ADMIN — gabapentin (NEURONTIN) capsule 100 mg: 100 mg | ORAL | @ 19:00:00

## 2024-06-17 MED ADMIN — apixaban (ELIQUIS) tablet 10 mg: 10 mg | ORAL | @ 12:00:00 | Stop: 2024-06-20

## 2024-06-17 MED ADMIN — ipratropium-albuterol (DUO-NEB) 0.5-2.5 mg/3 mL nebulizer solution 3 mL: 3 mL | RESPIRATORY_TRACT | @ 02:00:00

## 2024-06-17 MED ADMIN — senna (SENOKOT) tablet 2 tablet: 2 | ORAL | @ 01:00:00

## 2024-06-17 MED ADMIN — dilTIAZem (CARDIZEM CD) 24 hr capsule 240 mg: 240 mg | ORAL | @ 12:00:00

## 2024-06-17 MED ADMIN — acetaminophen (TYLENOL) tablet 650 mg: 650 mg | ORAL | @ 04:00:00

## 2024-06-17 MED ADMIN — ipratropium-albuterol (DUO-NEB) 0.5-2.5 mg/3 mL nebulizer solution 3 mL: 3 mL | RESPIRATORY_TRACT | @ 13:00:00

## 2024-06-17 MED ADMIN — acetaminophen (TYLENOL) tablet 650 mg: 650 mg | ORAL | @ 12:00:00

## 2024-06-17 MED ADMIN — ipratropium-albuterol (DUO-NEB) 0.5-2.5 mg/3 mL nebulizer solution 3 mL: 3 mL | RESPIRATORY_TRACT | @ 20:00:00

## 2024-06-17 MED ADMIN — apixaban (ELIQUIS) tablet 10 mg: 10 mg | ORAL | @ 01:00:00 | Stop: 2024-06-20

## 2024-06-17 MED ADMIN — acetaminophen (TYLENOL) tablet 650 mg: 650 mg | ORAL | @ 20:00:00

## 2024-06-17 MED ADMIN — polyethylene glycol (MIRALAX) packet 17 g: 17 g | ORAL | @ 16:00:00

## 2024-06-17 MED ADMIN — sodium ferric gluconate (FERRLECIT) 250 mg in sodium chloride (NS) 0.9 % 100 mL IVPB: 250 mg | INTRAVENOUS | @ 12:00:00 | Stop: 2024-06-17

## 2024-06-17 NOTE — Unmapped (Signed)
 Shift Summary  Contact precautions were initially discontinued but then maintained, ensuring continued infection control.    Polyethylene glycol was administered to aid in bowel movements.    Diclofenac sodium was administered once, but a later dose was refused.    Peripheral IV site was assessed and found to be in good condition, with no intervention needed.    Overall, the patient remained stable with no new signs of infection, and discharge planning is in place for [facility].   Dressings changed today.     Absence of Hospital-Acquired Illness or Injury: Contact precautions were maintained throughout the shift, and personal protective equipment was consistently utilized, indicating adherence to infection prevention protocols.     Optimal Comfort and Wellbeing: Comfort measures were taken, including a full linen change and repositioning, and pain was reported as 0 on a 0-10 scale.     Readiness for Transition of Care: The expected discharge disposition is Home Health Services, indicating a plan for continued care post-discharge.     Absence of Infection Signs and Symptoms: Temperature remained stable and within normal limits throughout the shift, suggesting no new signs of infection.

## 2024-06-17 NOTE — Unmapped (Signed)
 Shift Summary  Pain levels decreased significantly from 7 to 3, indicating effective pain management.    Temperature remained stable and within normal limits, suggesting no signs of infection.    Contact precautions were consistently maintained throughout the shift.    Patient was on room air, indicating stable respiratory status.    Overall, the patient showed improvement in comfort and maintained stable vital signs.     Optimal Comfort and Wellbeing: Pain levels decreased from 7 to 3 after interventions, including pain management, indicating improved comfort.     Absence of Infection Signs and Symptoms: Temperature remained stable and within normal limits, with the highest recorded temperature being 36.7 ??C (98.1 ??F).     Skin Health and Integrity: Bruising was noted, but the dressing on the abdomen and sacrum wounds remained clean, dry, and intact throughout the shift.

## 2024-06-17 NOTE — Unmapped (Addendum)
 Home health has been set up for through the agency listed below. The Home health agency will be contacting you to set up a time for them to come see you in your home within 2 days of your discharge.  If you have not heard from them prior to 06/19/24 or you have any questions about home health, please contact them at the phone number listed below.    Home Care [facility]  - Admitted Since 06/13/2024       Service Provider Services Address Phone Fax Patient Preferred    [facility] [address] [place], [place] [address] [phone] --      [facility]       Service Provider Services Address Phone [facility]  Ambulances [address] [place], [place] [address] [phone]

## 2024-06-17 NOTE — Unmapped (Signed)
 Family Medicine Inpatient Service  Progress Note    Team: Family Medicine [name] (pgr [phone])    Hospital Day: 4    ASSESSMENT / PLAN:   Tammy Braun is a 83 y.o. female with large paraesophageal hernia, severe pHTN, pAF, COPD, RVF p/w dyspnea.     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 We diuresed her with IV lasix and she developed an AKI. We held her home furosemide and have not diuresed further. She now appears euvolemic. Cr still elevated. Per family patient is quite deconditioned from her baseline and is concerned re taking care of her at home.   Plan  - PE: Continue eliquis at PE dosing. 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 suspect from IV diuresis, improved today to 1.26. Encourage po fluids, no IVF d/t her RVF  - Follow up BMP, Mg    # Iron deficiency anemia  Iron studies indicate she qualifies for iron transfusion based on TSAT < 16%.  - IV iron 250 mg for 3 doses    #PHTN - Moderate TR. Last saw cardiology 11/2023. Appears to have evidence of severe pHTN dating back 05/2020. 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   - Pulm consult inpatient  - Cardio follow-up outpatient recommended    #COPD - Tobacco use disorder, 65 pack year history  Last saw pulmonology in 04/2024. Underlying lung physiology a bit unclear as she has imaging findings of severe emphysematous lung changes but no obstructive pattern on spirometry; discussed continuing albuterol PRN. No s/s COPD exacerbation.  - DuoNebs PRN  - Albuterol PRN   - O2 goal 88-92%    #HTN  - Continue home cardizem 240mg  every day     #Pressure Wound  - WOCN c/s    #Paroxysmal Atrial Fibrillation.   - Cont cardizem; started eliquis as above  - K > 4, Mg > 2    #Hypokalemia and HypoMag. Hx of pAFib.   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  - Minimize nephrotoxic meds, holding diuretics for now    # Checklist:  - IVF None  - Daily labs needed: CBC, BMP, and Magnesium  - Diet Regular  - Bowel Regimen: Senna 2 tabs qHS and miralax BID  - DVT: Eliquis  - Code Status:   Orders Placed This Encounter   Procedures    Full Code     Standing Status:   Standing  Number of Occurrences:   1     - Dispo: Floor    [ ]  Anticipated Discharge Location: Home with home health  [ ]  PT/OT/DME: PT/OT ordered  [ ]  CM/SW needs: Likely pending PT/OT recs  [ ]  Follow up appt: Appointment needed    SUBJECTIVE:  Interval events:   Pt feeling better, daughter hesitant to go home before Cr back to baseline.    REVIEW OF SYSTEMS:  Pertinent positives and negatives per HPI. A complete review of systems otherwise negative.    PHYSICAL EXAM:      Intake/Output Summary (Last 24 hours) at 06/17/2024 1539  Last data filed at 06/17/2024 0530  Gross per 24 hour   Intake 240 ml   Output 690 ml   Net -450 ml       Recent Vitals:  Vitals:    06/17/24 0815   BP: 126/84   Pulse: 70   Resp: 16   Temp: 36.5 ??C (97.7 ??F)   SpO2: 91%       GEN: well appearing, lying in bed, NAD, hard of hearing  HEENT: NCAT, No scleral icterus. Conjunctiva non-erythematous. MMM.  CV: Regular rate. 2/6 systolic murmur. Rubs/gallops.   Pulm: Normal work of breathing on RA. CTAB. No wheezing, crackles, or rhonchi.  Abd: Flat.  Nontender. No guarding, rebound.  Normoactive bowel sounds.  Urostomy producing clear yellow urine, ostomy site looks clean and intact.  Neuro: A&O x 3. No focal deficits.  Ext: No edema.  Palpable distal pulses.  Skin: No rashes or skin lesions.     LABS/ STUDIES:    All imaging, laboratory studies, and other pertinent tests including electrocardiography within the last 24 hours were reviewed and are summarized within the assessment and plan.     NUTRITION:       Demaris Freiberg MD PGY2  [facility]

## 2024-06-18 LAB — BASIC METABOLIC PANEL
ANION GAP: 12 mmol/L (ref 5–14)
BLOOD UREA NITROGEN: 19 mg/dL (ref 9–23)
BUN / CREAT RATIO: 15
CALCIUM: 10 mg/dL (ref 8.7–10.4)
CHLORIDE: 95 mmol/L — ABNORMAL LOW (ref 98–107)
CO2: 27.6 mmol/L (ref 20.0–31.0)
CREATININE: 1.24 mg/dL — ABNORMAL HIGH (ref 0.55–1.02)
EGFR CKD-EPI (2021) FEMALE: 44 mL/min/1.73m2 — ABNORMAL LOW (ref >=60)
GLUCOSE RANDOM: 203 mg/dL — ABNORMAL HIGH (ref 70–179)
POTASSIUM: 5.2 mmol/L — ABNORMAL HIGH (ref 3.4–4.8)
SODIUM: 135 mmol/L (ref 135–145)

## 2024-06-18 LAB — CBC
HEMATOCRIT: 36.5 % (ref 34.0–44.0)
HEMOGLOBIN: 12 g/dL (ref 11.3–14.9)
MEAN CORPUSCULAR HEMOGLOBIN CONC: 32.8 g/dL (ref 32.0–36.0)
MEAN CORPUSCULAR HEMOGLOBIN: 26.7 pg (ref 25.9–32.4)
MEAN CORPUSCULAR VOLUME: 81.4 fL (ref 77.6–95.7)
MEAN PLATELET VOLUME: 7.8 fL (ref 6.8–10.7)
PLATELET COUNT: 269 10*9/L (ref 150–450)
RED BLOOD CELL COUNT: 4.48 10*12/L (ref 3.95–5.13)
RED CELL DISTRIBUTION WIDTH: 18.4 % — ABNORMAL HIGH (ref 12.2–15.2)
WBC ADJUSTED: 8.6 10*9/L (ref 3.6–11.2)

## 2024-06-18 LAB — MAGNESIUM: MAGNESIUM: 2.5 mg/dL (ref 1.6–2.6)

## 2024-06-18 LAB — APTT
APTT: 41.5 s — ABNORMAL HIGH (ref 24.8–38.4)
HEPARIN CORRELATION: 0.2

## 2024-06-18 MED ORDER — LEVOFLOXACIN 500 MG TABLET
ORAL_TABLET | ORAL | 0 refills | 2.00000 days
Start: 2024-06-18 — End: 2024-06-19

## 2024-06-18 MED ORDER — DICLOFENAC 1 % TOPICAL GEL
Freq: Four times a day (QID) | TOPICAL | 1 refills | 13.00000 days
Start: 2024-06-18 — End: 2025-06-18

## 2024-06-18 MED ORDER — APIXABAN 5 MG TABLET
ORAL_TABLET | ORAL | 0 refills | 32.00000 days
Start: 2024-06-18 — End: 2024-07-20

## 2024-06-18 MED ORDER — SENNOSIDES 8.6 MG TABLET
ORAL_TABLET | Freq: Every evening | ORAL | 2 refills | 30.00000 days
Start: 2024-06-18 — End: 2024-09-16

## 2024-06-18 MED ORDER — POLYETHYLENE GLYCOL 3350 17 GRAM ORAL POWDER PACKET
PACK | Freq: Two times a day (BID) | ORAL | 2 refills | 30.00000 days
Start: 2024-06-18 — End: 2024-09-16

## 2024-06-18 MED ADMIN — diclofenac sodium (VOLTAREN) 1 % gel 2 g: 2 g | TOPICAL | @ 09:00:00

## 2024-06-18 MED ADMIN — dilTIAZem (CARDIZEM CD) 24 hr capsule 240 mg: 240 mg | ORAL | @ 13:00:00

## 2024-06-18 MED ADMIN — lactated ringers bolus 500 mL: 500 mL | INTRAVENOUS | @ 22:00:00 | Stop: 2024-06-18

## 2024-06-18 MED ADMIN — polyethylene glycol (MIRALAX) packet 17 g: 17 g | ORAL | @ 13:00:00

## 2024-06-18 MED ADMIN — polyethylene glycol (MIRALAX) packet 17 g: 17 g | ORAL

## 2024-06-18 MED ADMIN — SMOG ENEMA: 240 mL | RECTAL | @ 16:00:00 | Stop: 2024-06-18

## 2024-06-18 MED ADMIN — iohexol (OMNIPAQUE) 240 mg iodine/mL oral solution 50 mL: 50 mL | ORAL | @ 22:00:00 | Stop: 2024-06-18

## 2024-06-18 MED ADMIN — diclofenac sodium (VOLTAREN) 1 % gel 2 g: 2 g | TOPICAL | @ 17:00:00

## 2024-06-18 MED ADMIN — diclofenac sodium (VOLTAREN) 1 % gel 2 g: 2 g | TOPICAL | @ 01:00:00

## 2024-06-18 MED ADMIN — bisacodyl (DULCOLAX) suppository 10 mg: 10 mg | RECTAL | @ 19:00:00

## 2024-06-18 MED ADMIN — acetaminophen (TYLENOL) tablet 650 mg: 650 mg | ORAL | @ 04:00:00

## 2024-06-18 MED ADMIN — apixaban (ELIQUIS) tablet 10 mg: 10 mg | ORAL | Stop: 2024-06-20

## 2024-06-18 MED ADMIN — magnesium citrate solution 296 mL: 296 mL | ORAL | @ 04:00:00 | Stop: 2024-06-18

## 2024-06-18 MED ADMIN — acetaminophen (TYLENOL) tablet 650 mg: 650 mg | ORAL | @ 13:00:00

## 2024-06-18 MED ADMIN — apixaban (ELIQUIS) tablet 10 mg: 10 mg | ORAL | @ 13:00:00 | Stop: 2024-06-18

## 2024-06-18 MED ADMIN — diclofenac sodium (VOLTAREN) 1 % gel 2 g: 2 g | TOPICAL | @ 21:00:00

## 2024-06-18 MED ADMIN — ipratropium-albuterol (DUO-NEB) 0.5-2.5 mg/3 mL nebulizer solution 3 mL: 3 mL | RESPIRATORY_TRACT | @ 21:00:00

## 2024-06-18 MED ADMIN — gabapentin (NEURONTIN) capsule 100 mg: 100 mg | ORAL

## 2024-06-18 MED ADMIN — ipratropium-albuterol (DUO-NEB) 0.5-2.5 mg/3 mL nebulizer solution 3 mL: 3 mL | RESPIRATORY_TRACT | @ 09:00:00

## 2024-06-18 MED ADMIN — ipratropium-albuterol (DUO-NEB) 0.5-2.5 mg/3 mL nebulizer solution 3 mL: 3 mL | RESPIRATORY_TRACT | @ 01:00:00

## 2024-06-18 MED ADMIN — senna (SENOKOT) tablet 2 tablet: 2 | ORAL

## 2024-06-18 MED ADMIN — ipratropium-albuterol (DUO-NEB) 0.5-2.5 mg/3 mL nebulizer solution 3 mL: 3 mL | RESPIRATORY_TRACT | @ 15:00:00

## 2024-06-18 MED ADMIN — levoFLOXacin (LEVAQUIN) tablet 500 mg: 500 mg | ORAL | @ 09:00:00 | Stop: 2024-06-18

## 2024-06-18 NOTE — Unmapped (Signed)
 Physician Discharge Summary [facility]  [facility]  [address]  [place] [address]  Dept: [phone]  Loc: [phone]     Identifying Information:   Tammy Braun  May 27, 1941  [id number]    Primary Care Physician: Elige [name], MD   Code Status: Full Code    Admit Date: 06/13/2024    Discharge Date: 06/22/2024     Discharge To: Home    Discharge Service: [facility]     Discharge Attending Physician: Ronal FORBES Schwartz, MD    Discharge Diagnoses:  Principal Problem:    Multifocal pneumonia  Active Problems:    Hypertension    Tobacco use disorder    Hernia, diaphragmatic    Chronic renal disease, stage 2, mildly decreased glomerular filtration rate (GFR) between 60-89 mL/min/1.73 square meter    COPD (chronic obstructive pulmonary disease) with emphysema       Schizoaffective disorder       Pulmonary hypertension       History of bladder cancer    Atrial fibrillation, unspecified type       History of small bowel obstruction    S/P exploratory laparotomy    Subacute pulmonary embolism       History of MDR Pseudomonas aeruginosa infection    Chronic right-sided heart failure         Outpatient Provider Follow Up Issues:   [ ]  Follow up with pulm  [ ]  Sleep study  [ ]  ongoing conversation about duration of Eliquis  (should be lifelong)  [ ]  cardiology follow up    Hospital Course:   #C/f early SBO vs ileus (improving)  Minimal BM on 6/18, on 6/19 had emesis after breakfast and was more distended. KUB with c/f SBO. NPO all day, no BM with enema, did have a BM with suppository after enema. Surgery consulted given pt s/p small bowel resection for SBO in February, agree with CT abdomen pelvis w/oral contrast which showed c/f early SBO vs ileus. She was NPO for 24 hours and given 500cc bolus. Pt has now had multiple BM, feeling much better. At the time of discharge she was having regular bowel movements and tolerating an oral diet.      #Dyspnea - RVF exacerbation 2/2 severe pHTN (resolved)  Tammy Braun is a 83F who p/w two days of worsening dyspnea worse with lying flat and ambulating. Work up c/w dyspneas 2/2 RVF and fluid hypervolemia, PE, and PNA. For RVF diuresed with IV lasix  and she developed an AKI. We held her home furosemide  and have not diuresed further. She now appears euvolemic. Cr still elevated. Resumed PO lasix  during admission and pt remained euvolemic. She did have some desaturations with movement.    #CAP (resolved)  History of MDR pseudomonal CAP in the past. On this presentation increased shortness of breath, found to have concern for PNA based on imaging this presentation. Completed 5 days of Levaquin . Repeat CXR obtained during admission with improvement.      #Chronic Segmental RLL PE  Noted on CTA, appears to be new. L PE resolved. Was previously on eliquis , however pt discontinued due to concern about falls and risk of bleeding. Received heparin  gtt for 24 hrs given c/f SBO, however now tolerating PO, transitioned back to Eliquis . Eliquis  10mg  BIDx7 days transitioned to 5mg  BID on 6/21, which she will continue.    #AKI (resolved)  Cr bump to 1.42 baseline around 0.9. suspect from IV diuresis, improving, now 1.07, cautious  with fluids given RHF. S/p 500cc bolus on 6/19. Creatinine of day of discharge was 0.93.     # Iron  deficiency anemia  Iron  studies indicate she qualifies for iron  transfusion based on TSAT < 16%. Given 750mg  iron  during admission     #COPD - Tobacco use disorder, 65 pack year history  Last saw pulmonology in 04/2024. Underlying lung physiology a bit unclear as she has imaging findings of severe emphysematous lung changes but no obstructive pattern on spirometry; discussed continuing albuterol  PRN. No s/s COPD exacerbation.    Touchbase with Outpatient Provider:  Warm Handoff: Completed on 06/22/24 by Demaris JONELLE Freiberg, MD  (Resident) via [facility]    Procedures:  None  No admission procedures for hospital encounter.  ______________________________________________________________________  Discharge Medications:     Your Medication List        STOP taking these medications      aspirin  325 MG delayed release (EC) tablet            START taking these medications      apixaban  5 mg Tab  Commonly known as: ELIQUIS   Take 1 tablet (5 mg total) by mouth two (2) times a day.     diclofenac  sodium 1 % gel  Commonly known as: VOLTAREN   Apply 2 grams topically four (4) times a day.     dilTIAZem  240 MG 24 hr capsule  Commonly known as: CARDIZEM  CD  Take 1 capsule (240 mg total) by mouth daily.     polyethylene glycol 17 gram/dose powder  Commonly known as: GLYCOLAX   Dissolve one capful (17 grams) in 4 to 8oz of fluid and drink by mouth daily as directed.     senna 8.6 mg tablet  Commonly known as: SENNA  Take 2 tablets by mouth nightly.            CHANGE how you take these medications      gabapentin  100 MG capsule  Commonly known as: NEURONTIN   Take 1 capsule (100 mg total) by mouth Three (3) times a day as needed (pain).  What changed:   when to take this  reasons to take this            CONTINUE taking these medications      acetaminophen  500 MG tablet  Commonly known as: TYLENOL   Take 1 tablet (500 mg total) by mouth every six (6) hours.     albuterol  90 mcg/actuation inhaler  Commonly known as: PROVENTIL  HFA;VENTOLIN  HFA  Inhale 2 puffs every four (4) hours as needed for wheezing.     blood-glucose meter kit  Disp. blood glucose meter kit preferred by patient's insurance. Check blood sugars as directed by provider. Dx: Diabetes, E11.9     fluPHENAZine  decanoate 25 mg/mL injection  Commonly known as: PROLIXIN   Inject 2 mL (50 mg total) into the muscle every thirty (30) days.     furosemide  20 MG tablet  Commonly known as: LASIX   Take 1 tablet (20 mg total) by mouth daily.     glucose blood test strip  Generic drug: blood sugar diagnostic  Disp test strips preferred by insurance plan. Testing qday, Dx: E11.9 (Type 2 DM- controlled)     inhalational spacing device Spcr  Commonly known as: E-Z SPACER  1 each by Miscellaneous route every four (4) hours as needed.     lancets Misc  Disp. lancets #30 or amount allowed, Testing once Qday. Dx: E11.9 (Diabetes- controlled)     MEDICAL SUPPLY  ITEM  Entreal formula nutritionally supplement complete caloric density     MEDICAL SUPPLY ITEM  Compression stockings knee high     miscellaneous medical supply Misc  1 each by Miscellaneous route four (4) times a day.     underpads 2.6 X 2.9 feet Pads  1 each by Miscellaneous route two (2) times a day.              Allergies:  Lisinopril, Losartan , and Hctz [hydrochlorothiazide ]  ______________________________________________________________________  Pending Test Results (if blank, then none):        Most Recent Labs:  All lab results last 24 hours -   Recent Results (from the past 24 hours)   CBC    Collection Time: 06/22/24  5:44 AM   Result Value Ref Range    WBC 6.9 3.6 - 11.2 10*9/L    RBC 4.01 3.95 - 5.13 10*12/L    HGB 10.7 (L) 11.3 - 14.9 g/dL    HCT 66.8 (L) 65.9 - 44.0 %    MCV 82.6 77.6 - 95.7 fL    MCH 26.8 25.9 - 32.4 pg    MCHC 32.4 32.0 - 36.0 g/dL    RDW 81.6 (H) 87.7 - 15.2 %    MPV 7.4 6.8 - 10.7 fL    Platelet 320 150 - 450 10*9/L   Comprehensive Metabolic Panel    Collection Time: 06/22/24  5:44 AM   Result Value Ref Range    Sodium 138 135 - 145 mmol/L    Potassium 4.5 3.4 - 4.8 mmol/L    Chloride 99 98 - 107 mmol/L    CO2 25.3 20.0 - 31.0 mmol/L    Anion Gap 14 5 - 14 mmol/L    BUN 16 9 - 23 mg/dL    Creatinine 9.06 9.44 - 1.02 mg/dL    BUN/Creatinine Ratio 17     eGFR CKD-EPI (2021) Female 62 >=60 mL/min/1.46m2    Glucose 182 (H) 70 - 179 mg/dL    Calcium  9.7 8.7 - 10.4 mg/dL    Albumin  2.8 (L) 3.4 - 5.0 g/dL    Total Protein 7.0 5.7 - 8.2 g/dL    Total Bilirubin 0.3 0.3 - 1.2 mg/dL    AST <8 <=65 U/L    ALT <7 (L) 10 - 49 U/L    Alkaline Phosphatase 86 46 - 116 U/L   Magnesium  Level    Collection Time: 06/22/24  5:44 AM Result Value Ref Range    Magnesium  2.0 1.6 - 2.6 mg/dL       Relevant Studies/Radiology (if blank, then none):  ECG 12 Lead  Result Date: 06/20/2024  SINUS RHYTHM WITH FREQUENT PREMATURE VENTRICULAR BEATS LEFT ATRIAL ENLARGEMENT ABNORMAL ECG WHEN COMPARED WITH ECG OF 20-Jun-2024 18:02, PREMATURE VENTRICULAR BEATS ARE NOW PRESENT Confirmed by Mazzella, Tony ([id number]) on 06/20/2024 8:53:59 PM    CT Abdomen Pelvis Wo Contrast  Result Date: 06/19/2024  EXAM: CT ABDOMEN PELVIS WO CONTRAST ACCESSION: [id number] UN REPORT DATE: 06/18/2024 8:59 PM CLINICAL INDICATION: 83 years old with concern for obstruction  COMPARISON: CT abdomen and pelvis 03/20/2024 TECHNIQUE: A spiral CT scan was obtained without IV contrast from the lung bases to the pubic symphysis.  Images were reconstructed in the axial plane. Coronal and sagittal reformatted images were also provided for further evaluation. Evaluation of the solid organs and vasculature is limited in the absence of intravenous contrast. FINDINGS: LOWER CHEST: Advanced centrilobular and paraseptal emphysema. Trace right-sided pleural effusion. Extensive coronary artery calcifications. Lung base atelectasis  which is more prominent on the right side LIVER: Normal liver contour. No focal liver lesion on non-contrast examination. BILIARY: The gallbladder is surgically absent. No intrahepatic biliary ductal dilatation. SPLEEN: Normal in size and contour. PANCREAS: Diffuse fatty atrophy. No signs of inflammation or gross ductal dilatation. ADRENAL GLANDS: Thickening of the bilateral adrenal glands, similar to prior KIDNEYS/URETERS: Smooth renal contours.  No nephrolithiasis. Multiple bilateral renal cysts, unchanged. Intermediate attenuation lesion in the right lower pole measuring 0.4 cm (2:91), may represent proteinaceous or hemorrhagic cysts and appears similar to prior. Mild to moderate bilateral hydronephrosis and dilated ureters to the level of ileal conduit. BLADDER: Right lower quadrant ileal conduit which appears distended to the level of ostomy entrance along the anterior abdominal wall. Bladder surgically absent. REPRODUCTIVE ORGANS: Uterus is surgically absent. GI TRACT: Large type IV hiatal/paraesophageal hernia containing stomach, portion of the transverse colon, and pancreas. The stomach is located at the right side of the hernia sac similar to the prior with vertical orientation/organoaxial rotation.. Enteric contrast opacifying the stomach and small bowel. No extraluminal contrast visualized. Postsurgical changes of partial small bowel resection with anastomosis in the mid pelvis. There is evidence of parastomal hernia containing a prominent loop of gas-containing small bowel without obvious evidence of wall thickening or adjacent stranding or obstruction. Multiple mildly dilated loops of small bowel measuring up to 3.6 cm. No definitive transition point visualized. The large bowel is normal in caliber and contains gas. Colonic diverticulosis. Mild bowel wall thickening of the rectum. PERITONEUM/RETROPERITONEUM AND MESENTERY: No free air. Previous identified perihepatic fluid is not well-visualized on the current examination. No fluid collection. Scattered surgical clips throughout the retroperitoneum and pelvis. VASCULATURE: Normal caliber aorta. Extensive atherosclerotic calcifications of the abdominal aorta and branches. Multilevel degenerative changes of the spine. Otherwise, limited evaluation without contrast. LYMPH NODES: No adenopathy. BONES and SOFT TISSUES: No aggressive osseous lesions. Postsurgical changes of the anterior abdominal wall. Right parastomal hernia containing fat and a portion of mild dilated small bowel at the level of anastomosis. Multilevel degenerative changes of the spine. Diffuse osseous demineralization. Stable grade 1 anterolisthesis of L4 on L5. Scattered subcentimeter calcifications in the subcutaneous tissues of the posterior abdomen, similar to prior. Partially visualized internal fixation of the left humerus     -Redemonstrated large type IV hiatal hernia containing stomach, bowel loops, pancreas and significant amount of intra-abdominal fat/mesentery. No evidence of gastric obstruction or colonic obstruction. - Multiple mildly dilated loops of small bowel with no definitive abrupt transition point visualized. However there is a parastomal hernia containing small bowel loop which appears dilated without evidence of obstruction at the level of small bowel anastomosis. The findings may overall represent ileus versus early/partial small bowel obstruction. Correlation with radiographs close be considered to ensure the passage of contrast to the colonic loops. - Right lower quadrant ileal conduit with mild to moderate bilateral hydroureteronephrosis. Ileal conduit appears distended and there may be obstruction at the level of ileal conduit entrance to the ostomy along the anterior abdominal wall. - Mild wall thickening of the rectum, which is nonspecific and could also be secondary to underdistention. - Trace right pleural effusion. Right lung base atelectasis. ==================== MODIFIED REPORT: (06/19/2024 10:39 AM) This report has been modified from its preliminary version; you may check the prior versions of radiology report, results history link for prior report versions (if they were previously visible in [employer]). -----------------------------------------------     XR Abdomen 1 View  Result Date: 06/18/2024  EXAM: XR ABDOMEN 1  VIEW ACCESSION: [id number] UN REPORT DATE: 06/18/2024 8:04 AM CLINICAL INDICATION: 83 years old with ABDOMINAL PAIN  -  UNSPECIFIED SITE  COMPARISON: Abdominal radiograph/1/25. TECHNIQUE: Supine view of the abdomen, 2 image(s) FINDINGS: Right lower quadrant ostomy is present. Multiple distended loops of small bowel within the left hemiabdomen measure up to 5.2cm. Patient has a known diaphragmatic hernia incompletely evaluated on this exam.     Multiple mildly dilated loops of small bowel measuring up to 5.2 cm for which bowel obstruction cannot be excluded.     XR Chest Portable  Result Date: 06/16/2024  EXAM: XR CHEST PORTABLE ACCESSION: [id number] UN REPORT DATE: 06/16/2024 1:29 PM CLINICAL INDICATION: COUGH  TECHNIQUE: Single View AP Chest Radiograph. COMPARISON: 06/13/2024 FINDINGS: Post reverse left shoulder arthroplasty. Bibasilar atelectasis.  No pleural effusion or pneumothorax. Large complex hiatal hernia containing loops of bowel and stomach.     Large complex hiatal hernia containing loops of bowel and stomach with bibasilar atelectasis.     ECG 12 Lead  Result Date: 06/14/2024  SINUS RHYTHM WITH FREQUENT PREMATURE VENTRICULAR BEATS NON-SPECIFIC ST/T WAVE CHANGES ABNORMAL ECG WHEN COMPARED WITH ECG OF 03-Jun-2024 15:01, NO SIGNIFICANT CHANGE WAS FOUND Confirmed by Cleotilde Moccasin ([id number]) on 06/14/2024 8:51:59 AM    ECG 12 Lead  Result Date: 06/14/2024  ACCELERATED JUNCTIONAL RHYTHM WITH FREQUENT PREMATURE VENTRICULAR BEATS IN A PATTERN OF BIGEMINY ABNORMAL ECG WHEN COMPARED WITH ECG OF 13-Jun-2024 14:57, JUNCTIONAL RHYTHM HAS REPLACED SINUS RHYTHM    CTA Chest W Contrast  Result Date: 06/13/2024  EXAM: CTA CHEST W CONTRAST ACCESSION: [id number] UN REPORT DATE: 06/13/2024 8:40 PM CLINICAL INDICATION: cough, dyspnea. Outside CTAP showed R PE and CT chest noncon multifocal pneumonia.  TECHNIQUE: Contiguous axial images were reconstructed through the chest following a single breath hold helical acquisition during the administration of intravenous contrast material. Images were reformatted in the coronal and sagittal planes. MIP slabs were also constructed. COMPARISON: Same day chest radiograph, CT chest 03/20/2024 FINDINGS: The central pulmonary arteries are enlarged, with the main pulmonary artery measuring up to 4.1 cm in diameter. Occlusive subacute or chronic thrombus in a segmental right lower lobe pulmonary artery with poor opacification distally. There is poor opacification of distal pulmonary artery branches in the left lower lobe. The right atrium is enlarged. Atherosclerotic calcifications in the coronary arteries. No thoracic aortic aneurysm. Atherosclerotic calcifications in the aortic arch and descending aorta. Upper lung-predominant centrilobular and paraseptal emphysema. Scattered nodular, groundglass, and tree-in-bud opacities in the lungs, more extensive on the left. Bibasilar atelectasis. No pleural effusion or pneumothorax. There is a large posterior diaphragmatic hernia containing bowel loops and portions of the spleen and pancreas. Multiple partially visualized renal cystic lesions. The gallbladder is absent. No acute osseous abnormality.     1. Subacute or chronic occlusive segmental right lower lobe pulmonary embolus. Poor opacification of distal pulmonary artery branches in the left lower lobe likely reflects poor right heart output, though additional small distal emboli are not excluded. 2. Dilated right atrium. Enlarged central pulmonary arteries, reflecting known pulmonary hypertension. 3. Upper lung-predominant emphysema. Multifocal pulmonary infectious opacities, more extensive in the left lung. 4. Large posterior diaphragmatic hernia.    XR Chest 2 views  Result Date: 06/13/2024  EXAM: XR CHEST 2 VIEWS ACCESSION: [id number] UN REPORT DATE: 06/13/2024 5:52 PM CLINICAL INDICATION: DYSPNEA  TECHNIQUE: PA and Lateral Chest Radiographs COMPARISON: Chest radiograph 04/07/2024 FINDINGS: Partially visualized left reverse total shoulder arthroplasty. Improved aeration bilaterally with persistent bibasilar atelectasis. Persistent blunting of the right costophrenic sulcus. No pneumothorax.  Large hiatal hernia. Enlarged cardiopericardial silhouette. Thoracic aorta is tortuous with calcifications. Loss of the right acromiohumeral interval in keeping with chronic rotator cuff arthropathy.     Persistent but improved bibasilar airspace opacities and bilateral pleural effusions, both more pronounced on the left. Airspace opacities may reflect pneumonia or atelectasis.     US  Venous Img Lower Bilateral (DVT)  Result Date: 06/13/2024  CLINICAL DATA:  Pulmonary emboli, pain EXAM: BILATERAL LOWER EXTREMITY VENOUS DOPPLER ULTRASOUND TECHNIQUE: Gray-scale sonography with compression, as well as color and duplex ultrasound, were performed to evaluate the deep venous system(s) from the level of the common femoral vein through the popliteal and proximal calf veins. COMPARISON:  06/13/2024 FINDINGS: VENOUS Normal compressibility of the common femoral, superficial femoral, and popliteal veins, as well as the visualized calf veins. Visualized portions of profunda femoral vein and great saphenous vein unremarkable. No filling defects to suggest DVT on grayscale or color Doppler imaging. Doppler waveforms show normal direction of venous flow, normal respiratory plasticity and response to augmentation. OTHER None. Limitations: none IMPRESSION: 1. No evidence of deep venous thrombosis within either lower extremity. Electronically Signed   By: Ozell Daring M.D.   On: 06/13/2024 13:19    CT Abdomen Pelvis W Contrast  Result Date: 06/13/2024  EXAM: CT ABDOMEN AND PELVIS WITH CONTRAST 06/13/2024 10:15:29 AM TECHNIQUE: CT of the abdomen and pelvis was performed with the administration of intravenous contrast. Multiplanar reformatted images are provided for review. Automated exposure control, iterative reconstruction, and/or weight based adjustment of the mA/kV was utilized to reduce the radiation dose to as low as reasonably achievable. COMPARISON: CT of the abdomen pelvis without contrast 12/25/2023. CT chest without contrast 06/13/2024. CLINICAL HISTORY: Abdominal pain, acute, nonlocalized. BIBA due to difficulty breathing. Also stating her BP is high and her dr changed her medication on Tuesday and she has not had any relief since starting the new med. Pt has CPOD. She has a wet cough and is able to cough stuff up. EMS states 180/84 and all other vitals are normal. FINDINGS: LOWER CHEST: Incidental note is made of a right lower lobe pulmonary embolus within the interlobar artery. The main pulmonary artery is enlarged, measuring 3.4 cm. Coronary moderate calcifications are present. LIVER: The liver is unremarkable. GALLBLADDER AND BILE DUCTS: Gallbladder is unremarkable. No biliary ductal dilatation. SPLEEN: No acute abnormality. PANCREAS: No acute abnormality. ADRENAL GLANDS: No acute abnormality. KIDNEYS, URETERS AND BLADDER: Bilateral simple renal cysts are again noted. The largest right-sided cyst is at the upper pole measuring 3.2 cm. The largest left cyst measures 2 mm posteriorly. Per consensus, no follow-up is needed for simple Bosniak type 1 and 2 renal cysts, unless the patient has a malignancy history or risk factors. Air within the renal pelvis bilaterally is likely secondary to the urostomy. No stones in the kidneys or ureters. No hydronephrosis. No perinephric or periureteral stranding. Urinary bladder is mostly collapsed. GI AND BOWEL: A posterior right diaphragmatic hernia is present with nonobstructed loops of colon. Moderate stool somewhat distends the rectum measuring 6 cm in transverse diameter. Diverticular changes are present. No bowel obstruction. PERITONEUM AND RETROPERITONEUM: No ascites. No free air. VASCULATURE: Extensive atherosclerotic changes are present in the aorta and branch vessels. No aneurysm is present. LYMPH NODES: No lymphadenopathy. REPRODUCTIVE ORGANS: No acute abnormality. BONES AND SOFT TISSUES: Grade 1 degenerative anterolisthesis at L4-5 is stable. Severe right and moderate left foraminal stenosis is stable. No focal osseous lesions are present. Right lower quadrant ostomy is noted. IMPRESSION:  1. Right lower lobe pulmonary embolus within the interlobar artery. Main pulmonary artery is enlarged, measuring 3.4 cm. 2. Posterior right diaphragmatic hernia with nonobstructed loops of colon. 3. Moderate stool distending the rectum, measuring 6 cm in transverse diameter. Diverticular changes are present. No bowel obstruction. Electronically signed by: Lonni Necessary MD 06/13/2024 10:35 AM EDT RP Workstation: [id number]    CT Chest Wo Contrast  Result Date: 06/13/2024  CLINICAL DATA:  Respiratory illness. EXAM: CT CHEST WITHOUT CONTRAST TECHNIQUE: Multidetector CT imaging of the chest was performed following the standard protocol without IV contrast. RADIATION DOSE REDUCTION: This exam was performed according to the departmental dose-optimization program which includes automated exposure control, adjustment of the mA and/or kV according to patient size and/or use of iterative reconstruction technique. COMPARISON:  Chest x-ray earlier same day. FINDINGS: Cardiovascular: The heart is enlarged. No substantial pericardial effusion. Coronary artery calcification is evident. Ascending thoracic aorta measures 4 cm diameter. Enlargement of the pulmonary outflow tract/main pulmonary arteries suggests pulmonary arterial hypertension. Mediastinum/Nodes: No mediastinal lymphadenopathy. Upper normal 10 mm short axis AP window lymph node. No evidence for gross hilar lymphadenopathy although assessment is limited by the lack of intravenous contrast on the current study. The esophagus has normal imaging features. Markedly large hiatal hernia. Lungs/Pleura: Advanced changes of emphysema 10 mm ill-defined right upper lobe pulmonary nodule on image 42/3. Patchy ground-glass opacity noted anterior left upper lobe with patchy ground-glass and nodular consolidative opacity in the left base. Volume loss with atelectasis and/or scarring noted in the posterior lung bases adjacent to the hiatal hernia. No substantial pleural effusion. Upper Abdomen: Stomach, transverse colon, and pancreas are contained in the hiatal hernia. Gas is identified in the interpolar left kidney, likely within the collecting system. 3 cm exophytic lesion upper pole right kidney is similar to abdomen CT 12/25/2023 with attenuation too high to be a simple cyst. Stable 11 mm right adrenal nodule with similar appearance of diffuse left adrenal thickening. Musculoskeletal: No worrisome lytic or sclerotic osseous abnormality. Status post left shoulder replacement. IMPRESSION: 1. 10 mm ill-defined right upper lobe pulmonary nodule. Consider one of the following in 3 months for both low-risk and high-risk individuals: (a) repeat chest CT, (b) follow-up PET-CT, or (c) tissue sampling. This recommendation follows the consensus statement: Guidelines for Management of Incidental Pulmonary Nodules Detected on CT Images: From the Fleischner Society 2017; Radiology 2017; 284:228-243. 2. Patchy ground-glass opacity anterior left upper lobe with patchy ground-glass and nodular consolidative opacity in the left base. Imaging features compatible with multifocal pneumonia. Close attention on follow-up recommended. 3. Markedly large hiatal hernia contains the stomach, transverse colon, and pancreas. 4. Gas in the interpolar left kidney, likely within the collecting system. Correlation with urinalysis recommended to exclude emphysematous pyelitis. Consider CT abdomen and pelvis to further evaluate. Intravenous contrast would prove helpful if renal function permits. 5. Enlargement of the pulmonary outflow tract/main pulmonary arteries suggests pulmonary arterial hypertension. 6. 3 cm exophytic lesion upper pole right kidney is similar to abdomen CT 12/25/2023 with attenuation too high to be a simple cyst. Renal protocol CT or MRI recommended to further evaluate. 7. Stable 11 mm right adrenal nodule with similar appearance of diffuse left adrenal thickening. 8. 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 Electronically Signed   By: Camellia Candle M.D.   On: 06/13/2024 08:29    XR Chest Portable  Result Date: 06/13/2024  CLINICAL DATA:  Acute dyspnea, cough EXAM: PORTABLE CHEST 1 VIEW COMPARISON:  12/25/2023 FINDINGS: Cardiomegaly. Unchanged elevation  of the right hemidiaphragm with a large paraesophageal hernia. Diffuse bilateral interstitial opacity. No acute osseous findings. IMPRESSION: 1. Cardiomegaly with diffuse bilateral interstitial opacity, consistent with edema or atypical/viral infection. No focal airspace opacity. 2.  Large paraesophageal hernia. Electronically Signed   By: Marolyn JONETTA Jaksch M.D.   On: 06/13/2024 06:25    ______________________________________________________________________  Discharge Instructions:           Other Instructions       Discharge instructions      You were admitted to [facility] for shortness of breath and were found to have new a new PE, pneumonia, and thought to be a little fluid overloaded. You were restarted on Eliquis  at a higher dose which we then transitioned back to 5mg  twice daily. We would recommend that you stay on it for life given that this is the second PE you have had, and you can continue to discuss this with your outpatient provider.    To help with the swelling in your legs we gave you a dose of IV lasix  which helped improve the swelling in your legs. We then held your oral lasix  for a few days to give your kidneys a break. Their function improved and we restarted your daily 20mg  oral lasix . You should continue to take this daily and if you notice increased swelling in your legs or you gain more than 3 pounds in 2 days you should take an extra lasix  dose in the afternoon and let your primary care doctor know.     You completed the antibiotic course for the pneumonia.     There was some concern that the gabapentin  and muscle relaxer could be making you more sleepy so we stopped scheduling the gabapentin  and gave it you as needed. We recommend using both the gabapentin  and flexeril  only as needed.    There was concern that you had a bowel obstruction however with bowel rest you improved and were able to eat regular meals and have regular bowel movements. Continue using miralax  and senna to help prevent constipation.     We sent you home with oxygen on 1L nasal cannula, you likely do not need it continuously usually you need it most with activity and occasionally while sleeping. Your goal oxygen level is 88-92% which you can monitor with a pulse oximeter.     We will set up a follow up your primary care doctor next week and you were referred to cardiology. If you have chest pain, shortness of breath, increased swelling in your legs, or difficulty eating/drinking you should return to the hospital before your follow up.    Please carefully read and follow these instructions below upon your discharge:    1) Please take your medications as prescribed and note the changes listed on your discharge. At future follow-up appointments, please be sure to take all of your medications with you so your provider can better guide your care.     2) Seek medical care with your primary care doctor or local Emergency Room or Urgent Care if you develop any changes in your mental status, worsening abdominal pain, fevers greater than 101.5, any unexplained/unrelieved shortness of breath, uncontrolled nausea and vomiting that keeps you from remaining hydrated or taking your medication, or any other concerning symptoms.     3) Please go to your follow-up appointments. Some of your follow-up appointments have been listed below. If you do not see an appointment listed below with your primary care doctor, please call  your doctor's office as soon as possible to schedule an appointment to be seen within 7-10 days of discharge.     4) If you have any concerns before you are able to follow-up with your primary care doctor, you can reach us  by calling [phone] and asking to page the resident on call.            Resources and Referrals       Commode      Length of Need: 99    Type of commode: Chair Height: 152.4 cm (5')     Weight: 61.3 kg (135 lb 2.3 oz)        Height: 152.4 cm (5')     Weight: 61.3 kg (135 lb 2.3 oz)    Commode      Length of Need: 99    Type of commode: Chair Comment - 3 in 1 with PADDED SEAT    Height: 152.4 cm (5')     Weight: 61.3 kg (135 lb 2.3 oz)        Height: 152.4 cm (5')     Weight: 61.3 kg (135 lb 2.3 oz)    Oxygen      Oxygen Type: Continuous    % saturation on room air at rest: 86    Provide Continous Oxygen at (LPM): 1    Oxygen device delivery method: Nasal Cannula    Type of portable O2 tank and concentrator to deliver: Gaseous    Other Orders: Optional Systems for Oxygen delivery    Optional System for Delivery: Eval for Oxygen Conserving Device System    Provide: Lightweight portable tank, carry bag, appropriate conserving device    Maintain SaO2 (greater than or = to)%: 88 Comment - max 92    Length of Need: 99    Qualifying SAT levels must be within 48 hours of discharge    Additional Information:  Please deliver oxygen to the hospital room.    Oxygen saturation on room air with patient at rest = 86%  Oxygen saturation on room air with exertion / ambulation = 85%  Oxygen saturation with exertion / ambulating on oxygen = 91% on 1 lpm        Qualifying SAT levels must be within 48 hours of discharge    Additional Information:  Please deliver oxygen to the hospital room.    Oxygen saturation on room air with patient at rest = 86%  Oxygen saturation on room air with exertion / ambulation = 85%  Oxygen saturation with exertion / ambulating on oxygen = 91% on 1 lpm      Home health has been set up for through the agency listed below. The Home health agency will be contacting you to set up a time for them to come see you in your home within 2 days of your discharge.  If you have not heard from them prior to 06/19/24 or you have any questions about home health, please contact them at the phone number listed below.    Home Care [facility]  - Admitted Since 06/13/2024       Service Provider Services Address Phone Fax Patient Preferred    [facility] [address] [place], [place] [address] [phone] --      [employer]       Service Provider Services Address Phone Fax    [facility]  Ambulances [address] [place], [place]  [address] [phone]                              Follow Up instructions and Outpatient Referrals     Ambulatory Referral to Home Health      Reason for referral: [facility]    Physician to follow patient's care: PCP    Disciplines requested:  Nursing  Physical Therapy  Occupational Therapy       Nursing requested: Wound Care    Wound count: Wound 2    Wound 1 type/staging: See notes    Wound 1 location: See notes    Wound 1 care orders: See notes    Wound 1 order frequency: See notes    Wound 2 type/staging: See notes    Wound 2 location: See notes    Wound 2 care orders: See notes    Wound 2 order frequency: See notes    Physical Therapy requested: Evaluate and treat    Occupational Therapy Requested: Evaluate and treat    Requested SOC Date:  Comment - 48 hours after discharge    Ambulatory Referral to Cardiology      Reason for referral: pulm HTN, Right heart failure    Specific Service Requested: General Cardiology    Discharge instructions          Appointments which have been scheduled for you      Jun 25, 2024 10:40 AM  (Arrive by 10:30 AM)  OFFICE VISIT with [facility] TRANSITION CLINIC  [facility] FAMILY MEDICINE [facility] REGION) [address]  [place] [address]  [phone]        Jun 25, 2024 11:20 AM  (Arrive by 11:10 AM)  OFFICE VISIT with Lynwood Mitchell Rake, MD  [facility] FAMILY MEDICINE [place] [facility] REGION) [address]  [place] [address]  [phone]        Jun 25, 2024 12:30 PM  (Arrive by 12:15 PM)  WHEEL CHAIR EVAL - ADULT with Reena LELON Grate, PT  [facility] NEUROLOGY PT [place] [address] [address] [place] [facility] REGION) [address]  [place] [address]  [phone]        Jun 26, 2024 11:15 AM  (Arrive by 11:00 AM)  OFFICE VISIT with Zachary Daring, MD  [facility] PSYCHIATRY STEP [facility]) [address] [place] [address]  [phone]        Jun 29, 2024 10:45 AM  (Arrive by 10:30 AM)  RETURN UROLOGY with Lamarr Silva, AGNP  [facility] UROLOGY SERVICE [place] [facility] REGION) [address]  [facility] 1 through 4  [place] [address]  [phone]        Jul 06, 2024 11:30 AM  (Arrive by 11:20 AM)  NURSE VISIT with [facility] NURSE  [facility] PSYCHIATRY [facility] AT [facility] CENTER [facility] REGION) [address]  [address]  [place] [address]  [phone]        Jul 09, 2024 10:00 AM  (Arrive by 9:30 AM)  NEW  GENERAL SURGERY with Sergio Mazzola Poli de Figueiredo, MD  [facility] GENERAL AND ACUTE CARE SURGERY [place] [facility] REGION) [address]  [place] [address]  [phone]        Jul 22, 2024 11:40 AM  (Arrive by 11:30 AM)  RETURN CONTINUITY  with Juliene Chesley Row, MD  [facility] FAMILY MEDICINE [place] [facility]) [address]  [place] [address]  [phone]        Oct 28, 2024 12:30 PM  (Arrive by 12:15 PM)  RETURN PULMONARY with Cesar CHRISTELLA Sharps, MD  [facility] PULMONARY SPECIALTY CL [place] [facility] REGION) [address]  [facility] 1 through 4  [place] [address]  [phone]             ______________________________________________________________________  Discharge Day Services:  BP 150/86  - Pulse 76  - Temp 36.5 ??C (97.7 ??F) (Oral)  - Resp 18  - Ht 152.4 cm (5')  - Wt 61.3 kg (135 lb 2.3 oz)  - SpO2 95%  - BMI 26.39 kg/m??   Pt seen on the day of discharge and determined appropriate for discharge.    GEN: well appearing, lying in bed, NAD, hard of hearing  HEENT: NCAT, No scleral icterus. Conjunctiva non-erythematous. MMM.  CV: Regular rate. 2/6 systolic murmur. Rubs/gallops.   Pulm: Normal work of breathing on 1L [place]. CTAB. No wheezing, crackles, or rhonchi.  Abd: mildly distended (significantly improved). Dressing over abdominal wound. Stoma slightly more protruded as imaged below.  Neuro: A&O x 3. No focal deficits.  Ext: trace lower extremity edema  Skin: Wounds as imaged below, dressing over abdominal wound       Stoma 6/23:         Sacral wound picture 6/19          Condition at Discharge: fair    Length of Discharge: I spent greater than 30 mins in the discharge of this patient.    Demaris Freiberg MD PGY2  [facility]

## 2024-06-18 NOTE — Unmapped (Signed)
 Shift Summary  Personal protective equipment was utilized consistently throughout the shift to prevent infection.   Patient declined repositioning multiple times, indicating limited mobility improvement.   Pain was effectively managed, with a reported pain level of 0 by the end of the shift.   Bisacodyl and lactated ringers were administered during the shift.   Overall, the patient showed signs of effective infection prevention and pain management, but mobility and activity levels remained unchanged.    Absence of Hospital-Acquired Illness or Injury: Personal protective equipment was consistently utilized throughout the shift, and incontinence pads were used to protect skin integrity, indicating effective infection prevention and skin protection measures.    Improved Ability to Complete Activities of Daily Living: Patient consistently declined repositioning throughout the shift, indicating limited improvement in mobility and activity levels.    Optimal Coping: Patient exhibited anxiety at the beginning of the shift, but pain levels were reported as zero by the end of the shift, suggesting effective pain management and potential improvement in coping.      Problem: Adult Inpatient Plan of Care  Goal: Plan of Care Review  Outcome: Shift Focus  Flowsheets (Taken 06/18/2024 1837)  Progress: improving  Plan of Care Reviewed With:   patient   child  Goal: Absence of Hospital-Acquired Illness or Injury  Outcome: Shift Focus  Intervention: Prevent Skin Injury  Recent Flowsheet Documentation  Taken 06/18/2024 1800 by Sherryll Soulier, RN  Positioning for Skin: Left  Taken 06/18/2024 1600 by Sherryll Soulier, RN  Positioning for Skin: Right     Problem: Self-Care Deficit  Goal: Improved Ability to Complete Activities of Daily Living  Outcome: Shift Focus     Problem: Wound  Goal: Optimal Coping  Outcome: Shift Focus

## 2024-06-18 NOTE — Unmapped (Signed)
 Family Medicine Inpatient Service  Progress Note    Team: Family Medicine Tammy Braun (pgr [phone])    Hospital Day: 5    ASSESSMENT / PLAN:   Tammy Braun is a 83 y.o. female with large paraesophageal hernia, severe pHTN, pAF, COPD, RVF p/w dyspnea.     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 Work up c/w dyspneas 2/2 RVF and fluid hypervolemia, PE, and PNA. For RVF diuresed with IV lasix  and she developed an AKI. We held her home furosemide  and have not diuresed further. She now appears euvolemic. Cr still elevated. Per family patient is quite deconditioned from her baseline and is concerned re taking care of her at home.     - Hold home PO lasix    - S/p IV lasix  for diureses    #constipation  Minimal BM yesterday. More distended today. Vomiting breakfast. 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 On this presentation increased shortness of breath, found to have concern for PNA based on imaging this presentation. Completed 5 days of Levaquin . Repeat CXR obtained during admission with improvement.   - s/p Levaquin  x5 days    #Chronic Segmental RLL PE  Noted on CTA, appears to be new. L PE resolved. Was previously on eliquis , however pt discontinued due to concern about falls and risk of bleeding. Restarted on Eliquis   - Eliquis  10mg  BIDx7 days transition to 5mg  BID on 6/21    #AKI   Cr bump to 1.42 baseline around 0.9. suspect from IV diuresis, improved today to 1.26. Encourage po fluids, no IVF d/t her RVF  - Follow up BMP, Mg    # Iron  deficiency anemia  Iron  studies indicate she qualifies for iron  transfusion based on TSAT < 16%.  - IV iron  250 mg for 3 doses    #PHTN - Moderate TR. Last saw cardiology 11/2023. Appears to have evidence of severe pHTN dating back 05/2020. 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   - Pulm consult inpatient  - Cardio follow-up outpatient recommended    #COPD - Tobacco use disorder, 65 pack year history  Last saw pulmonology in 04/2024. Underlying lung physiology a bit unclear as she has imaging findings of severe emphysematous lung changes but no obstructive pattern on spirometry; discussed continuing albuterol  PRN. No s/s COPD exacerbation.  - DuoNebs PRN  - Albuterol  PRN   - O2 goal 88-92%    #HTN  - Continue home cardizem  240mg  every day     #Pressure Wound  - WOCN c/s    #Paroxysmal Atrial Fibrillation.   - Cont cardizem ; started eliquis  as above  - K > 4, Mg > 2    #Hypokalemia and HypoMag. Hx of pAFib.   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  - Minimize nephrotoxic meds, holding diuretics for now    # Checklist:  - IVF None  - Daily labs needed: CBC, BMP, and Magnesium   - Diet Regular  - Bowel Regimen: Senna 2 tabs qHS and miralax  BID  - DVT: Eliquis   - Code Status:   Orders Placed This Encounter   Procedures    Full Code  Standing Status:   Standing     Number of Occurrences:   1     - Dispo: Floor    [ ]  Anticipated Discharge Location: Home with home health  [ ]  PT/OT/DME: PT/OT ordered  [ ]  CM/SW needs: Likely pending PT/OT recs  [ ]  Follow up appt: Appointment needed    SUBJECTIVE:  Interval events:   Pt feeling better, daughter hesitant to go home before Cr back to baseline.    REVIEW OF SYSTEMS:  Pertinent positives and negatives per HPI. A complete review of systems otherwise negative.    PHYSICAL EXAM:      Intake/Output Summary (Last 24 hours) at 06/18/2024 1034  Last data filed at 06/18/2024 0815  Gross per 24 hour   Intake 300 ml   Output 1200 ml   Net -900 ml       Recent Vitals:  Vitals:    06/18/24 0830   BP: 156/87   Pulse: 68   Resp: 16   Temp: 36.5 ??C (97.7 ??F)   SpO2: 95%       GEN: well appearing, lying in bed, NAD, hard of hearing  HEENT: NCAT, No scleral icterus. Conjunctiva non-erythematous. MMM.  CV: Regular rate. 2/6 systolic murmur. Rubs/gallops.   Pulm: Normal work of breathing on RA. CTAB. No wheezing, crackles, or rhonchi.  Abd: Distended, TTP. Urostomy not producing output, ostomy site looks clean and intact.  Neuro: A&O x 3. No focal deficits.  Ext: No edema.  Palpable distal pulses.  Skin: No rashes or skin lesions.     LABS/ STUDIES:    All imaging, laboratory studies, and other pertinent tests including electrocardiography within the last 24 hours were reviewed and are summarized within the assessment and plan.     NUTRITION:       Tammy Freiberg MD PGY2  [facility]

## 2024-06-18 NOTE — Unmapped (Signed)
 Shift Summary  SpO2 levels improved significantly from 87% to 94%, indicating better oxygenation.    Temperature decreased slightly, suggesting stabilization.    Pain management was effective, maintaining a pain level of 0 throughout the shift.    Skin integrity was preserved with consistent use of pressure reduction techniques and devices.    Overall, vital signs stabilized, and comfort was maintained, but dependency in daily activities persisted.     Optimal Comfort and Wellbeing: Pain levels remained at 0 throughout the shift, indicating effective pain management without the need for additional interventions.     Absence of Infection Signs and Symptoms: Temperature decreased slightly from 37.1 ??C to 36.7 ??C, and SpO2 levels improved from 87% to 94%, suggesting stabilization of vital signs.     Skin Health and Integrity: Skin integrity was maintained with no apparent problems in friction and shear, and pressure reduction techniques were consistently applied.     Improved Ability to Complete Activities of Daily Living: Patient remained dependent for bathing and required moderate assistance for general hygiene throughout the shift.     Optimal Coping: Anxiety was noted at the beginning of the shift, but no further documentation indicates changes in mood or coping strategies.

## 2024-06-18 NOTE — Unmapped (Signed)
 Care Management  Initial Transition Planning Assessment    Type of Residence: Mailing Address:  [address] 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  Past Surgical History:   has a past surgical history that includes Bladder surgery; Gallbladder surgery; Ileostomy (2012); Abdominal surgery; pr colonoscopy flx dx w/collj spec when pfrmd (N/A, 02/09/2015); pr sigmoidoscopy,biopsy (N/A, 03/11/2015); Chemotherapy (2012); Breast cyst excision; pr reconstr total shoulder implant (Left, 04/08/2018); pr xcapsl ctrc rmvl insj io lens prosth w/o ecp (Left, 06/05/2022); pr xcapsl ctrc rmvl insj io lens prosth w/o ecp (Right, 06/19/2022); pr exploratory of abdomen (N/A, 02/28/2024); pr reopen recent abd exploratory (Midline, 03/01/2024); and pr reopen recent abd exploratory (N/A, 03/03/2024).   Previous admit date: 02/27/2024    Primary Insurance- Payor: MEDICARE / Plan: MEDICARE PART A AND PART B / Product Type: *No Product type* /   Secondary Insurance - Secondary Insurance  MEDICAID [place]  Prescription Coverage -   Preferred Pharmacy - [facility] [phone] - [place] - [address] AT [facility] OF [address]  [id number] [place] - [address]  [facility]  [facility]  [facility]    Transportation home: Teacher, music / Social Worker assessed the patient by : In person interview with family  Orientation Level: Disoriented to situation  Functional level prior to admission: Partially Assisted  Who provides care at home?: Family member  Level of assistance required: Bathing, Dressing, Transferring  Reason for referral: Discharge Planning, Home Health, Transportation    Contact/Decision Maker  Extended Emergency Contact Information  Primary Emergency Contact: Baldwin,[name]   [place]  Mobile Phone: [phone]  Relation: Daughter  Secondary Emergency Contact: Curtis,Betty   [place]  Mobile Phone: [phone]  Relation: Daughter    Legal Next of Kin / Guardian / POA / Advance Directives     HCDM (patient stated preference) (Active): Gwyneth Bong - Daughter - [phone]    Advance Directive (Medical Treatment)  Does patient have an advance directive covering medical treatment?: Patient has advance directive covering medical treatment, copy in chart.    Health Care Decision Maker [HCDM] (Medical & Mental Health Treatment)  Healthcare Decision Maker: HCDM documented in the HCDM/Contact Info section.  Information offered on HCDM, Medical & Mental Health advance directives:: Other (Comment)    Advance Directive (Mental Health Treatment)  Does patient have an advance directive covering mental health treatment?: Patient has advance directive covering mental health treatment, copy in chart.    Readmission Information    Have you been hospitalized in the last 30 days?: No  Did the following happen with your discharge?                                                     Patient Information  Lives with: Children    Type of Residence: Private residence             Support Systems/Concerns: Children, Family Members    Responsibilities/Dependents at home?: No    Home Care services in place prior to admission?: Yes  Type of Home Care services in place prior to admission: Home nursing visits, Home OT, Home PT  Current Home Care provider (Name/Phone #): [facility]    Outpatient/Community Resources in place prior to admission: Clinic       Equipment Currently Used at Home: lift device, other (see comments), commode chair (Ramp)       Currently receiving outpatient dialysis?: No       Financial Information       Need for financial assistance?: No       Social Determinants of Health  Social Determinants of Health were addressed in provider documentation.  Please refer to patient history.  Social Drivers of Health     Food Insecurity: No Food Insecurity (01/06/2024)    Hunger Vital Sign     Worried About Running Out of Food in the Last Year: Never true     Ran Out of Food in the Last Year: Never true   Tobacco Use: Medium Risk (06/15/2024)    Patient History     Smoking Tobacco Use: Former     Smokeless Tobacco Use: Never     Passive Exposure: Not on file   Transportation Needs: No Transportation Needs (01/06/2024)    PRAPARE - Therapist, art (Medical): No     Lack of Transportation (Non-Medical): No   Alcohol Use: Not At Risk (08/12/2023)    Alcohol Use     How often do you have a drink containing alcohol?: Never     How many drinks containing alcohol do you have on a typical day when you are drinking?: 1 - 2     How often do you have 5 or more drinks on one occasion?: Never   Housing: Low Risk  (01/06/2024)    Housing     Within the past 12 months, have you ever stayed: outside, in a car, in a tent, in an overnight shelter, or temporarily in someone else's home (i.e. couch-surfing)?: No     Are you worried about losing your housing?: No   Physical Activity: Inactive (08/12/2023)    Exercise Vital Sign     Days of Exercise per Week: 0 days     Minutes of Exercise per Session: 0 min   Utilities: Low Risk  (01/06/2024)    Utilities     Within the past 12 months, have you been unable to get utilities (heat, electricity) when it was really needed?: No   Stress: Stress Concern Present (08/12/2023)    [employer] of Occupational Health - Occupational Stress Questionnaire     Feeling of Stress : To some extent   Interpersonal Safety: Not At Risk (06/03/2024)    Interpersonal Safety     Unsafe Where You Currently Live: No     Physically Hurt by Anyone: No  Abused by Anyone: No   Substance Use: Low Risk  (08/12/2023)    Substance Use     In the past year, how often have you used prescription drugs for non-medical reasons?: Never     In the past year, how often have you used illegal drugs?: Never     In the past year, have you used any substance for non-medical reasons?: No   Intimate Partner Violence: Not At Risk (06/03/2024)    Humiliation, Afraid, Rape, and Kick questionnaire     Fear of Current or Ex-Partner: No     Emotionally Abused: No     Physically Abused: No     Sexually Abused: No   Social Connections: Moderately Isolated (08/12/2023)    Social Connection and Isolation Panel     Frequency of Communication with Friends and Family: More than three times a week     Frequency of Social Gatherings with Friends and Family: Once a week     Attends Religious Services: More than 4 times per year     Active Member of [employer] or Organizations: No     Attends Banker Meetings: Never     Marital Status: Widowed   Physicist, medical Strain: Low Risk  (01/06/2024)    Overall Financial Resource Strain (CARDIA)     Difficulty of Paying Living Expenses: Not hard at all   Health Literacy: Low Risk  (04/06/2021)    Health Literacy     : Never   Internet Connectivity: No Internet connectivity concern identified (08/12/2023)    Internet Connectivity     Do you have access to internet services: Yes     How do you connect to the internet: Personal Device at home     Is your internet connection strong enough for you to watch video on your device without major problems?: Yes     Do you have enough data to get through the month?: Yes     Does at least one of the devices have a camera that you can use for video chat?: Yes       Complex Discharge Information    Is patient identified as a difficult/complex discharge?: No                                                               Interventions:       Discharge Needs Assessment  Concerns to be Addressed: discharge planning    Clinical Risk Factors: > 65, New Diagnosis, Multiple Diagnoses (Chronic)    Barriers to taking medications: No    Prior overnight hospital stay or ED visit in last 90 days: No              Anticipated Changes Related to Illness: none    Equipment Needed After Discharge: wheelchair, power    Discharge Facility/Level of Care Needs: other (see comments) (Home with [facility])    Readmission  Risk of Unplanned Readmission Score: UNPLANNED READMISSION SCORE: 23.54%  Predictive Model Details          24% (High)  Factor Value    Calculated 06/18/2024 16:02 16% Number of active inpatient medication orders 26    [facility] Risk of Unplanned Readmission Model 14% Number of ED visits in last six months 3  8% Active antipsychotic inpatient medication order present     8% ECG/EKG order present in last 6 months     7% Encounter of ten days or longer in last year present     7% Diagnosis of electrolyte disorder present     6% Restraint order present in last 6 months     6% Charlson Comorbidity Index 6     6% Imaging order present in last 6 months     6% Age 83     4% Number of hospitalizations in last year 1     4% Current length of stay 4.738 days     4% Active anticoagulant inpatient medication order present     4% Latest creatinine high (1.24 mg/dL)     2% Future appointment scheduled      Readmitted Within the Last 30 Days? (No if blank)   Patient at risk for readmission?: No    Discharge Plan  Screen findings are: Discharge planning needs identified or anticipated (Comment). (Home with [facility])    Expected Discharge Date: 06/20/2024    Expected Transfer from Critical Care:  (N/A)    Quality data for continuing care services shared with patient and/or representative?: Yes  Patient and/or family were provided with choice of facilities / services that are available and appropriate to meet post hospital care needs?: Yes   List choices in order highest to lowest preferred, if applicable. : [facility]    Initial Assessment complete?: Yes

## 2024-06-18 NOTE — Unmapped (Signed)
 [facility] General Surgery  Consult    Patient Name: Tammy Braun  Medical Record Number: [id number]  Date of Service: 06/18/24    Referring Provider: Demaris JONELLE Freiberg, MD.    Primary Provider: Elige [name], MD.    Chief Complaint: 83 y.o. female who is referred in consultation for a possible bowel obstruction.    History of Present Illness: Tammy Braun is an 83 y.o. female with a past medical history of bladder cancer (status post ileal conduit in 2012), COPD, HTN, HLD, DM, CKD, CVA, severe pHTN, pAF, PE, anxiety, depression, and tobacco use who presented to [facility] ED on 06/13/2024 for shortness of breath.   She was noted to have multifocal pneumonia with history of MDR pseudomonas and PE in right lower lobe on CT. She was started on antibiotics for pneumonia. PE subacute versus chronic, she was started on Eliquis for atrial fibrillation and PE. Last dose of Eliquis was this morning.     She reports she started having abdominal pain 2 days ago, describes it as achy. She had two episodes of vomiting this morning. She had two bites of grits and vomited it up. She has noticed increased belching.   She had a bowel movement just prior to our discussion and states her abdomen feels better.   Her daughter states her urostomy looks swollen and the appliance is protruding, which is not normal for her. She reports decreased urine output.     Prior surgical history of ileal conduit in 2012 for bladder cancer. She has had a laparoscopic cholecystectomy.   She was admitted to [facility] from 02/26/2024-04/14/2024 for a bowel obstruction. She underwent exploratory laparotomy with reduction of parastomal hernia, small bowel resection with primary anastomosis, lysis of adhesions, and her abdomen was left open on 02/28/2024.  She went back to the OR on 03/01/2024 for abdominal wall closure but this was aborted due to desaturation during attempt to close. She returned to the OR on 03/03/2024 for abdominal wall closure. She is still doing dressing changes to her abdominal wound, reports it is healing nicely.   Her hospital course was complicated by hypoxic respiratory failure secondary to mucus plug and subsequent pneumothorax requiring at least two intubations.     Also, previously noted to have large posterior diaphragmatic hernia containing stomach, transverse colon, and portions of small intestine, notable on CT since at least 08/2011.     She was started on O2 last night, on 2L currently. She states she does not use oxygen at home.     Past Medical History:  Past Medical History[1]  BMI: 26.01    Past Surgical History:   Past Surgical History[2]    Medications:   Medications Ordered Prior to Encounter[3]    Allergies:  Allergies[4]    Social History:  Social History[5]  - Lives in [place], [place]  - Lives with her daughter.   - Employment: retired, used to work in [employer], worked in Development worker, community at a school.   - She does use tobacco.     Family History:   Family History[6]    Review of Systems:   A 10-system review of systems was completed with the exception of those positives mentioned in history of present illness and past medical history is negative.     Labs:  Lab Results   Component Value Date    WBC 8.6 06/18/2024    HGB 12.0 06/18/2024    HCT 36.5 06/18/2024  PLT 269 06/18/2024     Lab Results   Component Value Date    NA 135 06/18/2024    K 5.2 (H) 06/18/2024    CL 95 (L) 06/18/2024    CO2 27.6 06/18/2024    BUN 19 06/18/2024    CREATININE 1.24 (H) 06/18/2024    GLU 203 (H) 06/18/2024    CALCIUM 10.0 06/18/2024    MG 2.5 06/18/2024    PHOS 3.1 04/13/2024     Lab Results   Component Value Date    BILITOT 0.3 06/13/2024    BILIDIR 0.10 03/22/2024    PROT 7.8 06/13/2024    ALBUMIN 3.3 (L) 06/13/2024    ALT <7 (L) 06/13/2024    AST 9 06/13/2024    ALKPHOS 93 06/13/2024    GGT 16 09/17/2011     Lab Results   Component Value Date    PT 12.1 03/04/2024    INR 1.06 03/04/2024    APTT 41.5 (H) 06/18/2024 Imaging:  KUB 06/18/2024:   Impression  Multiple mildly dilated loops of small bowel measuring up to 5.2 cm for which bowel obstruction cannot be excluded.    Physical Examination:     BP 131/72  - Pulse 108  - Temp 36.6 ??C (97.9 ??F) (Oral)  - Resp 16  - Ht 152.4 cm (5')  - Wt 60.4 kg (133 lb 2.5 oz)  - SpO2 97%  - BMI 26.01 kg/m??     CONSTITUTIONAL: female in no acute distress, whose appearance is consistent with stated age.   EYES: Anicteric. Extraocular movements are grossly intact.   EAR, NOSE, MOUTH AND THROAT: Mucous membranes moist.  Trachea midline.   HEART/CARDIOVASCULAR: No lower extremity edema.   CHEST/PULMONARY: Normal work of breathing on 2L O2 via [place].  ABDOMEN/GASTROINTESTINAL: Soft, mildly tender in the lower abdomen, mildly distended and tympanic, urostomy in right lower abdomen appears slightly swollen, midline incisional scar, left portion of incision is healing with granulation tissue noted  SKIN: Warm and well perfused without cyanosis or edema.   NEUROLOGIC: Alert and oriented x 3. Sensory and motor grossly intact.     Assessment/Plan: 83 y.o. female with a past medical history of bladder cancer (status post ileal conduit in 2012), COPD, HTN, HLD, DM, CKD, CVA, severe pHTN, pAF, PE, anxiety, depression, and tobacco use who presented to [facility] ED on 06/13/2024 for shortness of breath. She was noted to have multifocal pneumonia and is being treated with antibiotics.     She has now had 2 days of abdominal pain and nausea/vomiting this morning.   Just prior to my arrival, she had a bowel movement and had been passing gas. Reports improvement in her symptoms after bowel movement.     KUB shows mildly dilated loops of small bowel.     It is possible she has an early partial bowel obstruction that is potentially resolving now that she is feeling improved after a bowel movement.     She was also started on Iron and reports she hadn't had a bowel movement in a few days, abdominal distention and pain with subsequent nausea and vomiting could be secondary to constipation.    - Agree with CT A/P with oral contrast.   - If concerns of bowel obstruction on imaging and worsening clinical picture, would recommend initial medical management with NPO, NGT to LIWS, and mIVF.   - No acute surgical intervention warranted at this time. If surgery becomes warranted, would consider needing to be transferred  to [facility] for higher level of care due to her comorbidities.   - Continue with serial abdominal exams.   - General surgery will continue to follow along.   - Rest of care per primary team.     Edsel Oak, PA-C  06/18/2024  6:06 PM        [1]   Past Medical History:  Diagnosis Date    Anxiety     Arthritis     At risk for falls     Breast cyst     Cancer        bladder    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    Chronic kidney disease     Depression, psychotic        Diabetes mellitus        in past    Emphysema of lung        Financial difficulties     Frail elderly     Hearing impairment     Hernia     History of transfusion     Hyperlipidemia     Hypertension     Impaired mobility     Osteoporosis     Pulmonary emphysema    05/08/2015    Visual impairment    [2]   Past Surgical History:  Procedure Laterality Date    ABDOMINAL SURGERY      BLADDER SURGERY      BREAST CYST EXCISION      CHEMOTHERAPY  2012    bladder    GALLBLADDER SURGERY      stone removal    ILEOSTOMY  2012    PR COLONOSCOPY FLX DX W/COLLJ SPEC WHEN PFRMD N/A 02/09/2015    Procedure: COLONOSCOPY, FLEXIBLE, PROXIMAL TO SPLENIC FLEXURE; DIAGNOSTIC, W/WO COLLECTION SPECIMEN BY BRUSH OR WASH;  Surgeon: Eleanor CHRISTELLA Sorrel, MD;  Location: [facility] [place];  Service: Gastroenterology    PR EXPLORATORY OF ABDOMEN N/A 02/28/2024    Procedure: EXPLORATORY LAPAROTOMY, EXPLORATORY CELIOTOMY WITH OR WITHOUT BIOPSY(S);  Surgeon: Hollice Toribio Birmingham, MD;  Location: [facility] OR [facility];  Service: General Surgery    PR RECONSTR TOTAL SHOULDER IMPLANT Left 04/08/2018    Procedure: ARTHROPLASTY, GLENOHUMERAL JOINT; TOTAL SHOULDER(GLENOID & PROXIMAL HUMERAL REPLACEMENT(EG, TOTAL SHOULDER);  Surgeon: Alease Carrie Oyster, MD;  [facility]: [facility] OR [facility];  Service: Ortho Sports Medicine    PR REOPEN RECENT ABD EXPLORATORY Midline 03/01/2024    Procedure: REOPENING OF RECENT LAPAROTOMY;  Surgeon: Vicci Janas CROME., MD;  Location: OR [facility];  Service: Trauma    PR REOPEN RECENT ABD EXPLORATORY N/A 03/03/2024    Procedure: REOPENING OF RECENT LAPAROTOMY;  Surgeon: Quinn Tinnie Verla Dasie, MD;  Location: OR [facility];  Service: Trauma    PR SIGMOIDOSCOPY,BIOPSY N/A 03/11/2015    Procedure: SIGMOIDOSCOPY, FLEXIBLE; WITH BIOPSY, SINGLE OR MULTIPLE;  Surgeon: Sandrea CHRISTELLA Mood, MD;  [facility];  Service: Gastroenterology    PR XCAPSL CTRC RMVL INSJ IO LENS PROSTH W/O ECP Left 06/05/2022    Procedure: EXTRACAPSULAR CATARACT REMOVAL W/INSERTION OF INTRAOCULAR LENS PROSTHESIS, MANUAL OR MECHANICAL TECHNIQUE WITHOUT ENDOSCOPIC CYCLOPHOTOCOAGULATION;  Surgeon: Sinthu Sivarama Ranjan, MD;  Location: [facility] OR [facility];  Service: Ophthalmology    PR XCAPSL CTRC RMVL INSJ IO LENS PROSTH W/O ECP Right 06/19/2022    Procedure: EXTRACAPSULAR CATARACT REMOVAL W/INSERTION OF INTRAOCULAR LENS PROSTHESIS, MANUAL OR MECHANICAL TECHNIQUE WITHOUT ENDOSCOPIC CYCLOPHOTOCOAGULATION;  Surgeon: Sinthu Sivarama Ranjan, MD;  Location: [facility] OR [facility];  Service: Ophthalmology   [  3]   No current facility-administered medications on file prior to encounter.     Current Outpatient Medications on File Prior to Encounter   Medication Sig Dispense Refill    acetaminophen (TYLENOL) 500 MG tablet Take 1 tablet (500 mg total) by mouth every six (6) hours. 30 tablet 0    albuterol HFA 90 mcg/actuation inhaler Inhale 2 puffs every four (4) hours as needed for wheezing. 18 g 11    aspirin 325 MG delayed release (EC) tablet 1 po every day; may take a second tablet when needed. 100 tablet 3    blood sugar diagnostic (GLUCOSE BLOOD) Strp Disp test strips preferred by insurance plan. Testing qday, Dx: E11.9 (Type 2 DM- controlled) 30 strip 11    blood-glucose meter kit Disp. blood glucose meter kit preferred by patient's insurance. Check blood sugars as directed by provider. Dx: Diabetes, E11.9 1 each 1    dilTIAZem (CARDIZEM CD) 240 MG 24 hr capsule Take 1 capsule (240 mg total) by mouth daily. 90 capsule 3    fluPHENAZine decanoate (PROLIXIN) 25 mg/mL injection Inject 2 mL (50 mg total) into the muscle every thirty (30) days. 5 mL 11    furosemide (LASIX) 20 MG tablet Take 1 tablet (20 mg total) by mouth daily. 30 tablet 5    gabapentin (NEURONTIN) 100 MG capsule Take 1 capsule (100 mg total) by mouth Three (3) times a day. 90 capsule 2    inhalational spacing device (E-Z SPACER) Spcr 1 each by Miscellaneous route every four (4) hours as needed. 1 each 0    lancets Misc Disp. lancets #30 or amount allowed, Testing once Qday. Dx: E11.9 (Diabetes- controlled) 30 each 11    MEDICAL SUPPLY ITEM Entreal formula nutritionally supplement complete caloric density 3 application 3    MEDICAL SUPPLY ITEM Compression stockings knee high 2 each 0    miscellaneous medical supply Misc 1 each by Miscellaneous route four (4) times a day. 100 each 0    underpads 2.6 X 2.9 feet Pads 1 each by Miscellaneous route two (2) times a day. 100 each 3   [4]   Allergies  Allergen Reactions    Lisinopril Anaphylaxis and Swelling    Losartan Dizziness    Hctz [Hydrochlorothiazide]      SIADH   [5]   Social History  Socioeconomic History    Marital status: Widowed     Spouse name: None    Number of children: None    Years of education: None    Highest education level: None   Tobacco Use    Smoking status: Former     Current packs/day: 0.50     Types: Cigarettes    Smokeless tobacco: Never    Tobacco comments:     10cpd, quit about 2 months ago (April 2025)   Vaping Use    Vaping status: Never Used Substance and Sexual Activity    Alcohol use: No     Alcohol/week: 0.0 standard drinks of alcohol    Drug use: No    Sexual activity: Not Currently     Social Drivers of Health     Financial Resource Strain: Low Risk  (01/06/2024)    Overall Financial Resource Strain (CARDIA)     Difficulty of Paying Living Expenses: Not hard at all   Food Insecurity: No Food Insecurity (01/06/2024)    Hunger Vital Sign     Worried About Running Out of Food in the Last Year: Never true  Ran Out of Food in the Last Year: Never true   Transportation Needs: No Transportation Needs (01/06/2024)    PRAPARE - Therapist, art (Medical): No     Lack of Transportation (Non-Medical): No   Physical Activity: Inactive (08/12/2023)    Exercise Vital Sign     Days of Exercise per Week: 0 days     Minutes of Exercise per Session: 0 min   Stress: Stress Concern Present (08/12/2023)    [employer] of Occupational Health - Occupational Stress Questionnaire     Feeling of Stress : To some extent   Social Connections: Moderately Isolated (08/12/2023)    Social Connection and Isolation Panel     Frequency of Communication with Friends and Family: More than three times a week     Frequency of Social Gatherings with Friends and Family: Once a week     Attends Religious Services: More than 4 times per year     Active Member of [employer] or Organizations: No     Attends Banker Meetings: Never     Marital Status: Widowed   Housing: Low Risk  (01/06/2024)    Housing     Within the past 12 months, have you ever stayed: outside, in a car, in a tent, in an overnight shelter, or temporarily in someone else's home (i.e. couch-surfing)?: No     Are you worried about losing your housing?: No   [6]   Family History  Problem Relation Age of Onset    Breast cancer Daughter 3    Diabetes Mother     Glaucoma Father     Colon cancer Neg Hx     Ovarian cancer Neg Hx     Endometrial cancer Neg Hx     Anesthesia problems Neg Hx Bleeding Disorder Neg Hx

## 2024-06-18 NOTE — Unmapped (Signed)
 ULTRASOUND PIV PROCEDURE NOTE    Indications:   Poor venous access.    Ultrasound guidance was necessary to obtain access.     Procedure Details:  Identity of the patient was confirmed via name, medical record number and date of birth. The availability of the correct equipment was verified.    The vein was identified and measured for ultrasound catheter insertion.       Vein measurement (without tourniquet):   0.30cm   A(n) 22 gauge 1.75 catheter was selected based on the recommendations below:    [facility] Catheter/Vein Ratio Guidelines    Chart to determine PIV catheter size/length to use based on vein diameter and depth   Catheter Gauge Size (g)  22g 20g 18g   Catheter length (inches)  1.75 1.75 1.75   Catheter diameter measurement (mm) 0.9 mm 1.1 mm 1.3 mm          Minimum required vein diameter       Sonosite (cm)  0.27 cm 0.33 cm 0.39 cm          Maximum vein depth  1.25 cm 1.25 cm 1.25 cm          The field was prepared with necessary supplies and equipment.  Probe cover and sterile gel utilized. Insertion site was prepped with chlorhexidineand allowed to dry.  The catheter extension was primed with normal saline.  The [place] PIV was placed in the R Forearm with 1attempt(s). See LDA for additional details.    Catheter aspirated, 3mL blood return present. The catheter was then flushed with 10 mL of normal saline. Insertion site cleansed, and dressing applied per manufacturer guidelines. The catheter was inserted with difficulty due to poor vasculature by Tammy Nottingham, RN.    Thank you,     Tammy Nottingham, RN    Ultrasound Resource Nurse    Workup / Procedure Time:  30 minutes

## 2024-06-19 LAB — COMPREHENSIVE METABOLIC PANEL
ALBUMIN: 2.6 g/dL — ABNORMAL LOW (ref 3.4–5.0)
ALKALINE PHOSPHATASE: 66 U/L (ref 46–116)
ALT (SGPT): <7 U/L — ABNORMAL LOW (ref 10–49)
ANION GAP: 12 mmol/L (ref 5–14)
AST (SGOT): <8 U/L (ref <=34)
BILIRUBIN TOTAL: 0.3 mg/dL (ref 0.3–1.2)
BLOOD UREA NITROGEN: 20 mg/dL (ref 9–23)
BUN / CREAT RATIO: 19
CALCIUM: 9.4 mg/dL (ref 8.7–10.4)
CHLORIDE: 99 mmol/L (ref 98–107)
CO2: 26.7 mmol/L (ref 20.0–31.0)
CREATININE: 1.07 mg/dL — ABNORMAL HIGH (ref 0.55–1.02)
EGFR CKD-EPI (2021) FEMALE: 52 mL/min/{1.73_m2} — ABNORMAL LOW (ref >=60)
GLUCOSE RANDOM: 132 mg/dL (ref 70–179)
POTASSIUM: 4.9 mmol/L — ABNORMAL HIGH (ref 3.4–4.8)
PROTEIN TOTAL: 6.5 g/dL (ref 5.7–8.2)
SODIUM: 138 mmol/L (ref 135–145)

## 2024-06-19 LAB — CBC
HEMATOCRIT: 33.1 % — ABNORMAL LOW (ref 34.0–44.0)
HEMOGLOBIN: 10.9 g/dL — ABNORMAL LOW (ref 11.3–14.9)
MEAN CORPUSCULAR HEMOGLOBIN CONC: 33 g/dL (ref 32.0–36.0)
MEAN CORPUSCULAR HEMOGLOBIN: 27.1 pg (ref 25.9–32.4)
MEAN CORPUSCULAR VOLUME: 82.2 fL (ref 77.6–95.7)
MEAN PLATELET VOLUME: 8 fL (ref 6.8–10.7)
PLATELET COUNT: 282 10*9/L (ref 150–450)
RED BLOOD CELL COUNT: 4.02 10*12/L (ref 3.95–5.13)
RED CELL DISTRIBUTION WIDTH: 18.5 % — ABNORMAL HIGH (ref 12.2–15.2)
WBC ADJUSTED: 7.7 10*9/L (ref 3.6–11.2)

## 2024-06-19 LAB — LACTATE, VENOUS, WHOLE BLOOD: LACTATE BLOOD VENOUS: 0.9 mmol/L (ref 0.5–1.8)

## 2024-06-19 LAB — APTT
APTT: 121.6 s — ABNORMAL HIGH (ref 24.8–38.4)
HEPARIN CORRELATION: 0.7

## 2024-06-19 LAB — MAGNESIUM: MAGNESIUM: 2.5 mg/dL (ref 1.6–2.6)

## 2024-06-19 MED ORDER — DICLOFENAC 1 % TOPICAL GEL
Freq: Four times a day (QID) | TOPICAL | 1 refills | 13.00000 days
Start: 2024-06-19 — End: 2025-06-19

## 2024-06-19 MED ORDER — SENNOSIDES 8.6 MG TABLET
ORAL_TABLET | Freq: Every evening | ORAL | 2 refills | 30.00000 days
Start: 2024-06-19 — End: 2024-09-17

## 2024-06-19 MED ORDER — CYCLOBENZAPRINE 5 MG TABLET
ORAL_TABLET | Freq: Two times a day (BID) | ORAL | 5 refills | 30.00000 days | PRN
Start: 2024-06-19 — End: ?

## 2024-06-19 MED ORDER — APIXABAN 5 MG TABLET
ORAL_TABLET | Freq: Two times a day (BID) | ORAL | 3 refills | 30.00000 days
Start: 2024-06-19 — End: ?

## 2024-06-19 MED ORDER — POLYETHYLENE GLYCOL 3350 17 GRAM ORAL POWDER PACKET
PACK | Freq: Two times a day (BID) | ORAL | 2 refills | 30.00000 days
Start: 2024-06-19 — End: 2024-09-17

## 2024-06-19 MED ADMIN — diclofenac sodium (VOLTAREN) 1 % gel 2 g: 2 g | TOPICAL | @ 17:00:00

## 2024-06-19 MED ADMIN — bisacodyl (DULCOLAX) suppository 10 mg: 10 mg | RECTAL | @ 13:00:00

## 2024-06-19 MED ADMIN — dilTIAZem (CARDIZEM CD) 24 hr capsule 240 mg: 240 mg | ORAL | @ 13:00:00

## 2024-06-19 MED ADMIN — ipratropium-albuterol (DUO-NEB) 0.5-2.5 mg/3 mL nebulizer solution 3 mL: 3 mL | RESPIRATORY_TRACT | @ 01:00:00

## 2024-06-19 MED ADMIN — acetaminophen (TYLENOL) tablet 650 mg: 650 mg | ORAL | @ 19:00:00

## 2024-06-19 MED ADMIN — ipratropium-albuterol (DUO-NEB) 0.5-2.5 mg/3 mL nebulizer solution 3 mL: 3 mL | RESPIRATORY_TRACT | @ 13:00:00

## 2024-06-19 MED ADMIN — diclofenac sodium (VOLTAREN) 1 % gel 2 g: 2 g | TOPICAL | @ 22:00:00

## 2024-06-19 MED ADMIN — apixaban (ELIQUIS) tablet 10 mg: 10 mg | ORAL | @ 13:00:00 | Stop: 2024-06-21

## 2024-06-19 MED ADMIN — heparin 25,000 Units/250 mL (100 units/mL) in 0.45% saline infusion (premade): 0-24 [IU]/kg/h | INTRAVENOUS | @ 02:00:00

## 2024-06-19 MED ADMIN — ipratropium-albuterol (DUO-NEB) 0.5-2.5 mg/3 mL nebulizer solution 3 mL: 3 mL | RESPIRATORY_TRACT | @ 20:00:00

## 2024-06-19 MED ADMIN — diclofenac sodium (VOLTAREN) 1 % gel 2 g: 2 g | TOPICAL | @ 10:00:00

## 2024-06-19 MED ADMIN — polyethylene glycol (MIRALAX) packet 17 g: 17 g | ORAL | @ 13:00:00

## 2024-06-19 NOTE — Unmapped (Signed)
 Shift Summary  New dressings were applied to sacral and abdominal wounds, ensuring clean and intact wound care.    Aseptic technique and cohorting were consistently utilized to prevent infection.    Patient refused diclofenac  sodium in IMG CT [facility], but heparin  was administered in the same department.    Regular repositioning and skin assessments were conducted to maintain skin integrity.    Overall, the patient remained stable with no falls or infection signs, and comfort measures were effectively managed.     Absence of Infection Signs and Symptoms: Aseptic technique and cohorting were consistently maintained throughout the shift, indicating effective infection control measures.     Skin Health and Integrity: Positioning was regularly adjusted to prevent pressure injuries, and skin assessments noted bruising and thin epidermis with loss of subcutaneous tissue.     Optimal Coping: Sleep and rest patterns were promoted with minimal awakenings, and comfort measures such as changing bed pads and gowns were regularly performed.     Absence of Fall and Fall-Related Injury: Hourly visual checks and toileting every two hours were consistently performed, with no falls or injuries reported.

## 2024-06-19 NOTE — Unmapped (Signed)
 Shift Summary  Oxygen therapy was administered in the morning to support respiratory function.    Apixaban  was administered to manage potential clotting risks.    Patient required maximum assistance with a rolling walker, indicating significant mobility challenges.    Acetaminophen  was administered in the afternoon, possibly for pain management.    Overall, the patient showed stable vital signs and some improvement in mobility and oral intake, but continued to require significant assistance with activities of daily living.     Absence of Infection Signs and Symptoms: Temperature remained stable throughout the shift, with a slight increase from 36.3 ??C to 36.7 ??C, indicating no significant signs of infection.     Improved Ability to Complete Activities of Daily Living: Positioning was varied throughout the shift, with the patient able to sit in a chair by the afternoon, indicating some improvement in mobility.     Improved Oral Intake: Patient was able to feed herself during the shift, showing improvement in oral intake.

## 2024-06-19 NOTE — Unmapped (Signed)
 Family Medicine Inpatient Service  Progress Note    Team: Family [facility] [name] (pgr [phone])    Hospital Day: 6    ASSESSMENT / PLAN:   Tammy Braun is a 83 y.o. female with large paraesophageal hernia, severe pHTN, pAF, COPD, RVF p/w dyspnea.     #C/f early SBO vs ileus (improving)  Minimal BM on 6/18, on 6/19 had emesis after breakfast and was more distended. KUB with c/f SBO. NPO all day, no BM with enema, did have a BM with suppository after enema. Surgery consulted given pt s/p small bowel resection for SBO in February, agree with CT abdomen pelvis w/oral contrast which showed c/f early SBO vs ileus. She was NPO for 24 hours and given 500cc bolus. Pt has now had multiple BM, feeling much better. Will resume PO meds and advance diet as able today.    - resume bowel regimen: BID miralax  and senna  - surgery consult    #AKI (resolved)  Cr bump to 1.42 baseline around 0.9. suspect from IV diuresis, improving, now 1.07, cautious with fluids given RHF.  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 Work up c/w dyspneas 2/2 RVF and fluid hypervolemia, PE, and PNA. For RVF diuresed with IV lasix  and she developed an AKI. We held her home furosemide  and have not diuresed further. She now appears euvolemic. Cr still elevated. Per family patient is quite deconditioned from her baseline and is concerned re taking care of her at home. Will discuss resuming home lasix  today.    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 On this presentation increased shortness of breath, found to have concern for PNA based on imaging this presentation. Completed 5 days of Levaquin . Repeat CXR obtained during admission with improvement.   - s/p Levaquin  x5 days    #Chronic Segmental RLL PE  Noted on CTA, appears to be new. L PE resolved. Was previously on eliquis , however pt discontinued due to concern about falls and risk of bleeding. Received heparin  gtt for 24 hrs given c/f SBO, however now tolerating PO, transitioned back to Eliquis .  - Eliquis  10mg  BIDx7 days transition to 5mg  BID on 6/21    # Iron  deficiency anemia  Iron  studies indicate she qualifies for iron  transfusion based on TSAT < 16%.  - IV iron  250 mg for 3 doses    #PHTN - Moderate TR. Last saw cardiology 11/2023. Appears to have evidence of severe pHTN dating back 05/2020. 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   - Pulm consult inpatient  - Cardio follow-up outpatient recommended    #COPD - Tobacco use disorder, 65 pack year history  Last saw pulmonology in 04/2024. Underlying lung physiology a bit unclear as she has imaging findings of severe emphysematous lung changes but no obstructive pattern on spirometry; discussed continuing albuterol  PRN. No s/s COPD exacerbation.  - DuoNebs PRN  - Albuterol  PRN   - O2 goal 88-92%    #HTN  - Continue home cardizem  240mg  every day     #Pressure Wound  - WOCN c/s    #Paroxysmal Atrial Fibrillation.   - Cont cardizem ; started eliquis  as above  - K > 4,  Mg > 2    #Hypokalemia and HypoMag. Hx of pAFib.   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  - Minimize nephrotoxic meds, holding diuretics for now    # Checklist:  - IVF None  - Daily labs needed: CBC, BMP, and Magnesium   - Diet Regular  - Bowel Regimen: Senna 2 tabs qHS and miralax  BID  - DVT: Eliquis   - Code Status:   Orders Placed This Encounter   Procedures    Full Code     Standing Status:   Standing     Number of Occurrences:   1     - Dispo: Floor    [ ]  Anticipated Discharge Location: Home with home health  [ ]  PT/OT/DME: PT/OT ordered  [ ]  CM/SW needs: Likely pending PT/OT recs  [ ]  Follow up appt: Appointment needed    SUBJECTIVE:  Interval events:   Pt feeling better, daughter hesitant to go home before Cr back to baseline.    REVIEW OF SYSTEMS:  Pertinent positives and negatives per HPI. A complete review of systems otherwise negative.    PHYSICAL EXAM:      Intake/Output Summary (Last 24 hours) at 06/19/2024 1038  Last data filed at 06/19/2024 0600  Gross per 24 hour   Intake --   Output 1000 ml   Net -1000 ml       Recent Vitals:  Vitals:    06/19/24 0730   BP: 139/82   Pulse: 68   Resp: 16   Temp: 36.3 ??C (97.3 ??F)   SpO2: 99%       GEN: well appearing, lying in bed, NAD, hard of hearing  HEENT: NCAT, No scleral icterus. Conjunctiva non-erythematous. MMM.  CV: Regular rate. 2/6 systolic murmur. Rubs/gallops.   Pulm: Normal work of breathing on RA. CTAB. No wheezing, crackles, or rhonchi.  Abd: improved, soft, NTTP. Urostomy with urine, ostomy site looks clean and intact.  Neuro: A&O x 3. No focal deficits.  Ext: No edema.  Palpable distal pulses.  Skin: No rashes or skin lesions.     LABS/ STUDIES:    All imaging, laboratory studies, and other pertinent tests including electrocardiography within the last 24 hours were reviewed and are summarized within the assessment and plan.     NUTRITION:       Tammy Freiberg MD PGY2  [facility] Family Medicine

## 2024-06-19 NOTE — Unmapped (Signed)
 [facility] General Surgery  Consult follow-up note    Patient Name: Tammy Braun  Medical Record Number: [id number]  Date of Service: 06/19/24    Referring Provider: Demaris JONELLE Freiberg, MD.    Primary Provider: Elige [name], MD.    Chief Complaint: 83 y.o. female who is referred in consultation for a possible bowel obstruction.    Interval history:  No acute events overnight.  Reports she feels well this morning and denies any abdominal pain, nausea, or vomiting.  She has been passing gas and had 9 bowel movements that are mostly liquid.  Abdomen is soft, nontender, nondistended.  CT AP performed yesterday demonstrated multiple mildly dilated loops of small bowel with no definitive transition point most consistent with ileus.    Physical Examination:     BP 139/82  - Pulse 68  - Temp 36.3 ??C (97.3 ??F) (Oral)  - Resp 16  - Ht 152.4 cm (5')  - Wt 60.4 kg (133 lb 2.5 oz)  - SpO2 99%  - BMI 26.01 kg/m??     CONSTITUTIONAL: female in no acute distress, whose appearance is consistent with stated age.   EYES: Anicteric. Extraocular movements are grossly intact.   EAR, NOSE, MOUTH AND THROAT: Mucous membranes moist.  Trachea midline.   HEART/CARDIOVASCULAR: No lower extremity edema.   CHEST/PULMONARY: Normal work of breathing on 2L O2 via [place].  ABDOMEN/GASTROINTESTINAL: Soft, nontender nondistended.,  Ileal conduit urostomy is pink and healthy appearing with no edema, midline incisional scar, left portion of incision is healing.  SKIN: Warm and well perfused without cyanosis or edema.   NEUROLOGIC: Alert and oriented x 3. Sensory and motor grossly intact.     Assessment/Plan: 83 y.o. female with a past medical history of bladder cancer (status post ileal conduit in 2012), COPD, HTN, HLD, DM, CKD, CVA, severe pHTN, pAF, PE, anxiety, depression, and tobacco use who presented to [facility] ED on 06/13/2024 for shortness of breath. She was noted to have multifocal pneumonia and is being treated with antibiotics.     Has had return of bowel function and a benign abdominal exam.  Clinically is doing well.  Appears to have resolved constipation versus ileus.  CT demonstrated multiple mildly dilated loops of small bowel with no definitive transition point.    -No surgical intervention indicated at this time  -General Surgery will sign off at this time, please contact with any questions or concerns  -Once bowel function normalizes (slows down), would resume bowel regimen and avoid constipating medication as able  - Rest of care per primary team.

## 2024-06-20 LAB — CBC
HEMATOCRIT: 32 % — ABNORMAL LOW (ref 34.0–44.0)
HEMOGLOBIN: 10.5 g/dL — ABNORMAL LOW (ref 11.3–14.9)
MEAN CORPUSCULAR HEMOGLOBIN CONC: 32.7 g/dL (ref 32.0–36.0)
MEAN CORPUSCULAR HEMOGLOBIN: 26.8 pg (ref 25.9–32.4)
MEAN CORPUSCULAR VOLUME: 82 fL (ref 77.6–95.7)
MEAN PLATELET VOLUME: 7.7 fL (ref 6.8–10.7)
PLATELET COUNT: 264 10*9/L (ref 150–450)
RED BLOOD CELL COUNT: 3.9 10*12/L — ABNORMAL LOW (ref 3.95–5.13)
RED CELL DISTRIBUTION WIDTH: 18.4 % — ABNORMAL HIGH (ref 12.2–15.2)
WBC ADJUSTED: 7 10*9/L (ref 3.6–11.2)

## 2024-06-20 LAB — COMPREHENSIVE METABOLIC PANEL
ALBUMIN: 2.4 g/dL — ABNORMAL LOW (ref 3.4–5.0)
ALKALINE PHOSPHATASE: 77 U/L (ref 46–116)
ALT (SGPT): <7 U/L — ABNORMAL LOW (ref 10–49)
ANION GAP: 11 mmol/L (ref 5–14)
AST (SGOT): <8 U/L (ref <=34)
BILIRUBIN TOTAL: 0.3 mg/dL (ref 0.3–1.2)
BLOOD UREA NITROGEN: 21 mg/dL (ref 9–23)
BUN / CREAT RATIO: 20
CALCIUM: 9.2 mg/dL (ref 8.7–10.4)
CHLORIDE: 97 mmol/L — ABNORMAL LOW (ref 98–107)
CO2: 26.6 mmol/L (ref 20.0–31.0)
CREATININE: 1.07 mg/dL — ABNORMAL HIGH (ref 0.55–1.02)
EGFR CKD-EPI (2021) FEMALE: 52 mL/min/{1.73_m2} — ABNORMAL LOW (ref >=60)
GLUCOSE RANDOM: 198 mg/dL — ABNORMAL HIGH (ref 70–179)
POTASSIUM: 4.9 mmol/L (ref 3.5–5.1)
PROTEIN TOTAL: 6.2 g/dL (ref 5.7–8.2)
SODIUM: 135 mmol/L (ref 135–145)

## 2024-06-20 LAB — APTT
APTT: 38.8 s — ABNORMAL HIGH (ref 24.8–38.4)
HEPARIN CORRELATION: 0.2

## 2024-06-20 LAB — MAGNESIUM: MAGNESIUM: 2.2 mg/dL (ref 1.6–2.6)

## 2024-06-20 LAB — HIGH SENSITIVITY TROPONIN I - SINGLE: HIGH SENSITIVITY TROPONIN I: 14 ng/L (ref <=34)

## 2024-06-20 MED ADMIN — ipratropium-albuterol (DUO-NEB) 0.5-2.5 mg/3 mL nebulizer solution 3 mL: 3 mL | RESPIRATORY_TRACT | @ 13:00:00

## 2024-06-20 MED ADMIN — acetaminophen (TYLENOL) tablet 650 mg: 650 mg | ORAL | @ 09:00:00

## 2024-06-20 MED ADMIN — apixaban (ELIQUIS) tablet 10 mg: 10 mg | ORAL | @ 12:00:00 | Stop: 2024-06-20

## 2024-06-20 MED ADMIN — diclofenac sodium (VOLTAREN) 1 % gel 2 g: 2 g | TOPICAL | @ 01:00:00

## 2024-06-20 MED ADMIN — acetaminophen (TYLENOL) tablet 650 mg: 650 mg | ORAL | @ 19:00:00

## 2024-06-20 MED ADMIN — polyethylene glycol (MIRALAX) packet 17 g: 17 g | ORAL | @ 12:00:00

## 2024-06-20 MED ADMIN — ipratropium-albuterol (DUO-NEB) 0.5-2.5 mg/3 mL nebulizer solution 3 mL: 3 mL | RESPIRATORY_TRACT | @ 08:00:00

## 2024-06-20 MED ADMIN — apixaban (ELIQUIS) tablet 10 mg: 10 mg | ORAL | @ 01:00:00 | Stop: 2024-06-21

## 2024-06-20 MED ADMIN — ipratropium-albuterol (DUO-NEB) 0.5-2.5 mg/3 mL nebulizer solution 3 mL: 3 mL | RESPIRATORY_TRACT | @ 21:00:00

## 2024-06-20 MED ADMIN — ipratropium-albuterol (DUO-NEB) 0.5-2.5 mg/3 mL nebulizer solution 3 mL: 3 mL | RESPIRATORY_TRACT | @ 02:00:00

## 2024-06-20 MED ADMIN — senna (SENOKOT) tablet 2 tablet: 2 | ORAL | @ 01:00:00

## 2024-06-20 MED ADMIN — diclofenac sodium (VOLTAREN) 1 % gel 2 g: 2 g | TOPICAL | @ 20:00:00

## 2024-06-20 MED ADMIN — acetaminophen (TYLENOL) tablet 650 mg: 650 mg | ORAL | @ 01:00:00

## 2024-06-20 MED ADMIN — furosemide (LASIX) tablet 20 mg: 20 mg | ORAL | @ 15:00:00

## 2024-06-20 MED ADMIN — polyethylene glycol (MIRALAX) packet 17 g: 17 g | ORAL | @ 01:00:00

## 2024-06-20 MED ADMIN — diclofenac sodium (VOLTAREN) 1 % gel 2 g: 2 g | TOPICAL | @ 15:00:00

## 2024-06-20 NOTE — Unmapped (Signed)
 Patient resting in bed with family at bedside.   She is awake, alert and oriented to self and surroundings but disoriented to time and situation.   Repeatedly calling out for nursing staff and medical provider.   MD to bedside multiple times to speak with patient and family, along with this RN.     Patient plan of care is for DC Monday to home with [facility] with use of oxygen therapy (1 liter).  Sats on room air with exertion(eating/talking) went down to 85 percent.   Placed back on 1 liter with sats going up to 91 percent (goal 88-92 percent).     Patient also asking to get OOB to the chair. Per PT, patient is unable to stand. Would likely need lift device at this time.     Bright red blood was noted in stool x 1 episode this am. MD made aware and came to bedside for assessment. No further orders. All VSS and remain so at this time.     PIV est in the left and right arms are flushing without complication.     All safety measures in place. Bed alarm on/Low bed/call bell within reach.     Will continue to monitor.

## 2024-06-20 NOTE — Unmapped (Signed)
 Shift Summary  Abdominal dressing was changed and treated with cleansing/irrigation, maintaining skin health.    Positioning was adjusted every two hours to prevent skin breakdown.    Maximum assistance was required for mobility, with no change in the patient's contribution to activities.    Peripheral IV sites were assessed and maintained, ensuring patency and cleanliness.    Overall, the patient remained stable with no incidents of falls or hospital-acquired injuries.     Absence of Hospital-Acquired Illness or Injury: Hourly visual checks and alarms were consistently maintained throughout the shift, with no incidents reported, ensuring safety and monitoring.     Skin Health and Integrity: Positioning was adjusted every two hours, and the abdominal dressing was changed and treated with cleansing/irrigation, maintaining skin integrity.     Improved Ability to Complete Activities of Daily Living: Assistance level remained at maximum, with the patient contributing 25-49% to activities, indicating no change in ability to perform daily activities.     Absence of Fall and Fall-Related Injury: Toileting every two hours and hourly visual checks were consistently performed, preventing any fall-related incidents.

## 2024-06-20 NOTE — Unmapped (Addendum)
 Family Medicine Inpatient Service  Progress Note    Team: Family [name] (pgr [phone])    Hospital Day: 7    ASSESSMENT / PLAN:   Tammy Braun is a 83 y.o. female with large paraesophageal hernia, severe pHTN, pAF, COPD, RVF p/w dyspnea.     # C/f early SBO vs ileus (improving)  Surgery consulted given pt s/p small bowel resection for SBO in February, agree with CT abdomen pelvis w/oral contrast which showed c/f early SBO vs ileus. She was NPO for 24 hours and given 500cc bolus. Pt has now had multiple BM, feeling much better.   - bowel regimen: BID miralax  and senna    # Dyspnea  Presented with two days of worsening dyspnea, likely 2/2 RVF and fluid hypervolemia, PE, and PNA. For RVF diuresed with IV lasix . She now appears euvolemic, will resume home lasix  today. Patient on 2L O2 for the past couple days, will attempt wean today to hopefully get patient home  - resuming PO lasix  20mg  daily  - wean O2, walk test, goal O2 sat 88-92%  Oxygen saturation on room air with patient at rest  = 86%  Oxygen saturation on room air with exertion / ambulation = 85%  Oxygen saturation with exertion / ambulating on oxygen = 91% on 1 lpm      # AKI (resolved)  Cr bump to 1.42 baseline around 0.9. suspect from IV diuresis, improved.  - BMP, Mg daily    # CAP (resolved)  History of MDR pseudomonal CAP in the past. On this presentation increased shortness of breath, found to have concern for PNA based on imaging this presentation. Completed 5 days of Levaquin . Repeat CXR obtained during admission with improvement.   - s/p Levaquin  x5 days     #Chronic Segmental RLL PE  Noted on CTA, appears to be new. L PE resolved. Was previously on eliquis , however pt discontinued due to concern about falls and risk of bleeding.   - Eliquis  10mg  BIDx7 days transition to 5mg  BID on 6/22    # Iron  deficiency anemia  Iron  studies indicate she qualifies for iron  transfusion based on TSAT < 16%.  - s/p IV iron     # PHTN - Moderate TR. Last saw cardiology 11/2023. Appears to have evidence of severe pHTN dating back 05/2020. 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   - Cardio follow-up outpatient recommended    # COPD - Tobacco use disorder, 65 pack year history  Last saw pulmonology in 04/2024. Underlying lung physiology a bit unclear as she has imaging findings of severe emphysematous lung changes but no obstructive pattern on spirometry; discussed continuing albuterol  PRN. No s/s COPD exacerbation.  - DuoNebs PRN  - Albuterol  PRN   - O2 goal 88-92%    # HTN  - Continue home cardizem  240mg  every day     # Pressure Wound  - WOCN c/s    # Paroxysmal Atrial Fibrillation.   - Cont cardizem ; started eliquis  as above  - K > 4, Mg > 2    # Hypokalemia and HypoMag. Hx of pAFib.   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  - Minimize nephrotoxic meds, holding diuretics for now    # Checklist:  - IVF None  - Daily labs needed: CBC, BMP, and Magnesium   - Diet Regular  - Bowel  Regimen: Senna 2 tabs qHS and miralax  BID  - DVT: Eliquis   - Code Status:   Orders Placed This Encounter   Procedures    Full Code     Standing Status:   Standing     Number of Occurrences:   1     - Dispo: Floor    [ ]  Anticipated Discharge Location: Home with home health  [ ]  PT/OT/DME: PT/OT ordered  [ ]  CM/SW needs: Likely pending PT/OT recs  [ ]  Follow up appt: Appointment needed    SUBJECTIVE:  Interval events:   Pt feeling better, still on supplemental O2, breathing feels at baseline.    REVIEW OF SYSTEMS:  Pertinent positives and negatives per HPI. A complete review of systems otherwise negative.    PHYSICAL EXAM:      Intake/Output Summary (Last 24 hours) at 06/20/2024 0919  Last data filed at 06/20/2024 0715  Gross per 24 hour   Intake 240 ml   Output 1000 ml   Net -760 ml       Recent Vitals:  Vitals:    06/20/24 0810   BP:    Pulse:    Resp:    Temp:    SpO2: 92%       GEN: well appearing, lying in bed, NAD, hard of hearing  HEENT: NCAT, No scleral icterus. Conjunctiva non-erythematous. MMM.  CV: Regular rate. 2/6 systolic murmur. Rubs/gallops.   Pulm: Normal work of breathing on RA. CTAB. No wheezing, crackles, or rhonchi.  Abd: improved, soft, NTTP. Urostomy with urine, ostomy site looks clean and intact.  Neuro: A&O x 3. No focal deficits.  Ext: No edema.  Palpable distal pulses.  Skin: No rashes or skin lesions.     LABS/ STUDIES:    All imaging, laboratory studies, and other pertinent tests including electrocardiography within the last 24 hours were reviewed and are summarized within the assessment and plan.     NUTRITION:       Darice Croon, MD  [facility] Family Medicine, PGY 2  06/20/24 9:19 AM      Stacy Crock, MD  [facility] Medicine, PGY1

## 2024-06-21 LAB — COMPREHENSIVE METABOLIC PANEL
ALBUMIN: 2.5 g/dL — ABNORMAL LOW (ref 3.4–5.0)
ALKALINE PHOSPHATASE: 85 U/L (ref 46–116)
ALT (SGPT): <7 U/L — ABNORMAL LOW (ref 10–49)
ANION GAP: 12 mmol/L (ref 5–14)
AST (SGOT): <8 U/L (ref <=34)
BILIRUBIN TOTAL: 0.3 mg/dL (ref 0.3–1.2)
BLOOD UREA NITROGEN: 17 mg/dL (ref 9–23)
BUN / CREAT RATIO: 19
CALCIUM: 9.5 mg/dL (ref 8.7–10.4)
CHLORIDE: 100 mmol/L (ref 98–107)
CO2: 29.5 mmol/L (ref 20.0–31.0)
CREATININE: 0.9 mg/dL (ref 0.55–1.02)
EGFR CKD-EPI (2021) FEMALE: 64 mL/min/{1.73_m2} (ref >=60)
GLUCOSE RANDOM: 157 mg/dL (ref 70–179)
POTASSIUM: 4.5 mmol/L (ref 3.5–5.1)
PROTEIN TOTAL: 6.3 g/dL (ref 5.7–8.2)
SODIUM: 141 mmol/L (ref 135–145)

## 2024-06-21 LAB — MAGNESIUM: MAGNESIUM: 2 mg/dL (ref 1.6–2.6)

## 2024-06-21 LAB — CBC
HEMATOCRIT: 33 % — ABNORMAL LOW (ref 34.0–44.0)
HEMOGLOBIN: 10.8 g/dL — ABNORMAL LOW (ref 11.3–14.9)
MEAN CORPUSCULAR HEMOGLOBIN CONC: 32.7 g/dL (ref 32.0–36.0)
MEAN CORPUSCULAR HEMOGLOBIN: 27 pg (ref 25.9–32.4)
MEAN CORPUSCULAR VOLUME: 82.6 fL (ref 77.6–95.7)
MEAN PLATELET VOLUME: 7.7 fL (ref 6.8–10.7)
PLATELET COUNT: 298 10*9/L (ref 150–450)
RED BLOOD CELL COUNT: 3.99 10*12/L (ref 3.95–5.13)
RED CELL DISTRIBUTION WIDTH: 17.9 % — ABNORMAL HIGH (ref 12.2–15.2)
WBC ADJUSTED: 7.4 10*9/L (ref 3.6–11.2)

## 2024-06-21 LAB — APTT
APTT: 40 s — ABNORMAL HIGH (ref 24.8–38.4)
HEPARIN CORRELATION: 0.2

## 2024-06-21 MED ADMIN — polyethylene glycol (MIRALAX) packet 17 g: 17 g | ORAL | @ 13:00:00

## 2024-06-21 MED ADMIN — acetaminophen (TYLENOL) tablet 650 mg: 650 mg | ORAL | @ 10:00:00

## 2024-06-21 MED ADMIN — dilTIAZem (CARDIZEM CD) 24 hr capsule 240 mg: 240 mg | ORAL | @ 13:00:00

## 2024-06-21 MED ADMIN — diclofenac sodium (VOLTAREN) 1 % gel 2 g: 2 g | TOPICAL | @ 16:00:00

## 2024-06-21 MED ADMIN — cyclobenzaprine (FLEXERIL) tablet 5 mg: 5 mg | ORAL | @ 13:00:00

## 2024-06-21 MED ADMIN — ipratropium-albuterol (DUO-NEB) 0.5-2.5 mg/3 mL nebulizer solution 3 mL: 3 mL | RESPIRATORY_TRACT | @ 15:00:00

## 2024-06-21 MED ADMIN — ipratropium-albuterol (DUO-NEB) 0.5-2.5 mg/3 mL nebulizer solution 3 mL: 3 mL | RESPIRATORY_TRACT | @ 09:00:00

## 2024-06-21 MED ADMIN — furosemide (LASIX) tablet 20 mg: 20 mg | ORAL | @ 13:00:00

## 2024-06-21 MED ADMIN — diclofenac sodium (VOLTAREN) 1 % gel 2 g: 2 g | TOPICAL | @ 01:00:00

## 2024-06-21 MED ADMIN — diclofenac sodium (VOLTAREN) 1 % gel 2 g: 2 g | TOPICAL | @ 21:00:00

## 2024-06-21 MED ADMIN — senna (SENOKOT) tablet 2 tablet: 2 | ORAL | @ 01:00:00

## 2024-06-21 MED ADMIN — apixaban (ELIQUIS) tablet 10 mg: 10 mg | ORAL | @ 01:00:00 | Stop: 2024-06-20

## 2024-06-21 MED ADMIN — acetaminophen (TYLENOL) tablet 650 mg: 650 mg | ORAL | @ 20:00:00

## 2024-06-21 MED ADMIN — ipratropium-albuterol (DUO-NEB) 0.5-2.5 mg/3 mL nebulizer solution 3 mL: 3 mL | RESPIRATORY_TRACT | @ 01:00:00

## 2024-06-21 MED ADMIN — diclofenac sodium (VOLTAREN) 1 % gel 2 g: 2 g | TOPICAL | @ 10:00:00

## 2024-06-21 MED ADMIN — polyethylene glycol (MIRALAX) packet 17 g: 17 g | ORAL | @ 01:00:00

## 2024-06-21 MED ADMIN — acetaminophen (TYLENOL) tablet 650 mg: 650 mg | ORAL | @ 01:00:00

## 2024-06-21 MED ADMIN — cyclobenzaprine (FLEXERIL) tablet 5 mg: 5 mg | ORAL | @ 01:00:00

## 2024-06-21 MED ADMIN — apixaban (ELIQUIS) tablet 5 mg: 5 mg | ORAL | @ 13:00:00

## 2024-06-21 MED ADMIN — ipratropium-albuterol (DUO-NEB) 0.5-2.5 mg/3 mL nebulizer solution 3 mL: 3 mL | RESPIRATORY_TRACT | @ 20:00:00

## 2024-06-21 NOTE — Unmapped (Signed)
 Shift Summary  Cyclobenzaprine  was administered PRN for muscle spasms, contributing to the patient's comfort.    Flexeril  was administered, and the patient remained calm while awaiting results.    Temperature remained stable at 36.7 ??C (98.1 ??F) during the shift.    The unplanned readmission score showed a slight increase, indicating stable readiness for transition.    Overall, the patient maintained a stable condition with consistent safety measures in place.     Absence of Hospital-Acquired Illness or Injury: Side rails were consistently maintained at 3/4 throughout the shift, and all alarms were activated and audible, ensuring a safe environment.     Optimal Comfort and Wellbeing: Pain in the left arm was reported as aching and constant, but the patient declined intervention, maintaining a pain level of 3 on the scale.     Readiness for Transition of Care: The unplanned readmission score slightly increased from 24.85 to 24.96, indicating a stable but unchanged readiness for transition.

## 2024-06-21 NOTE — Unmapped (Addendum)
 Family Medicine Inpatient Service  Progress Note    Team: Family Medicine [name] (pgr [phone])    Hospital Day: 8    ASSESSMENT / PLAN:   Tammy Braun is a 83 y.o. female with large paraesophageal hernia, severe pHTN, pAF, COPD, RVF p/w dyspnea.     # C/f early SBO vs ileus (improving)  Surgery consulted given pt s/p small bowel resection for SBO in February, agree with CT abdomen pelvis w/oral contrast which showed c/f early SBO vs ileus. She was NPO for 24 hours and given 500cc bolus. Pt has now had multiple BM, feeling much better.   - bowel regimen: BID miralax  and senna    # Dyspnea - New O2 requirement   Presented with two days of worsening dyspnea, likely 2/2 RVF and fluid hypervolemia, PE, and PNA. For RVF diuresed with IV lasix . She now appears euvolemic, will resume home lasix  today. Patient on 2L O2 for the past couple days, suspect likely multi  - Continue home lasix  20mg  PO daily  - wean O2, goal O2 sat 88-92%    # AKI (resolved)  Cr bump to 1.42 baseline around 0.9. suspect from IV diuresis, improved.  - BMP, Mg daily    # CAP (resolved)  History of MDR pseudomonal CAP in the past. On this presentation increased shortness of breath, found to have concern for PNA based on imaging this presentation. Completed 5 days of Levaquin . Repeat CXR obtained during admission with improvement.   - s/p Levaquin  x5 days     #Chronic Segmental RLL PE  Noted on CTA, appears to be new. L PE resolved. Was previously on eliquis , however pt discontinued due to concern about falls and risk of bleeding.   - Eliquis  10mg  BIDx7 days transition to 5mg  BID on 6/22    # Iron  deficiency anemia  Iron  studies indicate she qualifies for iron  transfusion based on TSAT < 16%.  - s/p IV iron     # PHTN - Moderate TR. Last saw cardiology 11/2023. Appears to have evidence of severe pHTN dating back 05/2020. 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   - Cardio follow-up outpatient recommended    # COPD - Tobacco use disorder, 65 pack year history  Last saw pulmonology in 04/2024. Underlying lung physiology a bit unclear as she has imaging findings of severe emphysematous lung changes but no obstructive pattern on spirometry; discussed continuing albuterol  PRN. No s/s COPD exacerbation.  - DuoNebs PRN  - Albuterol  PRN   - O2 goal 88-92%    # HTN  - Continue home cardizem  240mg  every day     # Pressure Wound  - WOCN c/s    # Paroxysmal Atrial Fibrillation.   - Cont cardizem ; started eliquis  as above  - K > 4, Mg > 2    # Hypokalemia and HypoMag. Hx of pAFib.   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  - Minimize nephrotoxic meds, holding diuretics for now    # Checklist:  - IVF None  - Daily labs needed: CBC, BMP, and Magnesium   - Diet Regular  - Bowel Regimen: Senna 2 tabs qHS and miralax  BID  - DVT: Eliquis   - Code Status:   Orders Placed This Encounter   Procedures    Full Code     Standing Status:   Standing     Number  of Occurrences:   1     - Dispo: Floor    [ ]  Anticipated Discharge Location: Home with home health  [ ]  PT/OT/DME: PT/OT ordered  [ ]  CM/SW needs: Likely pending PT/OT recs  [ ]  Follow up appt: Appointment needed    Pt needing bedside commode as she is either confined to a single room and/or level of the home w/ no access to a bathroom on that level.    SUBJECTIVE:  Interval events:   Pt feeling better, still on supplemental O2, breathing feels at baseline.    REVIEW OF SYSTEMS:  Pertinent positives and negatives per HPI. A complete review of systems otherwise negative.    PHYSICAL EXAM:      Intake/Output Summary (Last 24 hours) at 06/21/2024 0912  Last data filed at 06/21/2024 0530  Gross per 24 hour   Intake 450 ml   Output 2750 ml   Net -2300 ml       Recent Vitals:  Vitals:    06/21/24 0800   BP: 151/78   Pulse: 74   Resp: 17   Temp: 36.7 ??C (98.1 ??F)   SpO2: 95%       GEN: well appearing, lying in bed, NAD, hard of hearing  HEENT: NCAT, No scleral icterus. Conjunctiva non-erythematous. MMM.  CV: Regular rate. 2/6 systolic murmur. Rubs/gallops.   Pulm: Normal work of breathing on 1L [place], CTAB. No wheezing, crackles, or rhonchi.  Abd: improved, soft, NTTP. Urostomy with urine, ostomy site looks clean and intact.  Neuro: A&O x 3. No focal deficits.  Ext: No edema.  Palpable distal pulses.  Skin: No rashes or skin lesions.     LABS/ STUDIES:    All imaging, laboratory studies, and other pertinent tests including electrocardiography within the last 24 hours were reviewed and are summarized within the assessment and plan.     NUTRITION:         Tammy Freiberg MD PGY2  [facility] Family Medicine

## 2024-06-21 NOTE — Unmapped (Signed)
 Patient resting in bed with eyes closed. VSS on room air at this time with oxygen sats between 88-91 percent at rest and recovering quickly after exertion.   This shift, patient has been alert and oriented to self and surroundings but disoriented to time and situation.   Able to follow simple commands.  Repeatedly calling out to nurses's station regarding her care.   Anxiety noted. Patient provided verbal reassurance.      Got up to chair with assistance of PT.   Back to bed using walker and 2 person assist, having difficulty with gait.     Urostomy bag draining clear, yellow urine without odor.   Pt taking all meds whole without issue.   Able to feed self after set up.     Remains on contact precautions.   Daughter at bedside.     All safety measures in place. Bed/chair alarm on. Low bed/call bell within reach.     Will continue to monitor.

## 2024-06-22 LAB — CBC
HEMATOCRIT: 33.1 % — ABNORMAL LOW (ref 34.0–44.0)
HEMOGLOBIN: 10.7 g/dL — ABNORMAL LOW (ref 11.3–14.9)
MEAN CORPUSCULAR HEMOGLOBIN CONC: 32.4 g/dL (ref 32.0–36.0)
MEAN CORPUSCULAR HEMOGLOBIN: 26.8 pg (ref 25.9–32.4)
MEAN CORPUSCULAR VOLUME: 82.6 fL (ref 77.6–95.7)
MEAN PLATELET VOLUME: 7.4 fL (ref 6.8–10.7)
PLATELET COUNT: 320 10*9/L (ref 150–450)
RED BLOOD CELL COUNT: 4.01 10*12/L (ref 3.95–5.13)
RED CELL DISTRIBUTION WIDTH: 18.3 % — ABNORMAL HIGH (ref 12.2–15.2)
WBC ADJUSTED: 6.9 10*9/L (ref 3.6–11.2)

## 2024-06-22 LAB — COMPREHENSIVE METABOLIC PANEL
ALBUMIN: 2.8 g/dL — ABNORMAL LOW (ref 3.4–5.0)
ALKALINE PHOSPHATASE: 86 U/L (ref 46–116)
ALT (SGPT): <7 U/L — ABNORMAL LOW (ref 10–49)
ANION GAP: 14 mmol/L (ref 5–14)
AST (SGOT): <8 U/L (ref <=34)
BILIRUBIN TOTAL: 0.3 mg/dL (ref 0.3–1.2)
BLOOD UREA NITROGEN: 16 mg/dL (ref 9–23)
BUN / CREAT RATIO: 17
CALCIUM: 9.7 mg/dL (ref 8.7–10.4)
CHLORIDE: 99 mmol/L (ref 98–107)
CO2: 25.3 mmol/L (ref 20.0–31.0)
CREATININE: 0.93 mg/dL (ref 0.55–1.02)
EGFR CKD-EPI (2021) FEMALE: 62 mL/min/{1.73_m2} (ref >=60)
GLUCOSE RANDOM: 182 mg/dL — ABNORMAL HIGH (ref 70–179)
POTASSIUM: 4.5 mmol/L (ref 3.4–4.8)
PROTEIN TOTAL: 7 g/dL (ref 5.7–8.2)
SODIUM: 138 mmol/L (ref 135–145)

## 2024-06-22 LAB — MAGNESIUM: MAGNESIUM: 2 mg/dL (ref 1.6–2.6)

## 2024-06-22 MED ORDER — DILTIAZEM CD 240 MG CAPSULE,EXTENDED RELEASE 24 HR
ORAL_CAPSULE | Freq: Every day | ORAL | 3 refills | 90.00000 days | Status: CP
Start: 2024-06-22 — End: 2024-06-22

## 2024-06-22 MED ORDER — POLYETHYLENE GLYCOL 3350 17 GRAM ORAL POWDER PACKET
PACK | Freq: Two times a day (BID) | ORAL | 2 refills | 30.00000 days | Status: CN
Start: 2024-06-22 — End: 2024-09-20

## 2024-06-22 MED ORDER — APIXABAN 5 MG TABLET
ORAL_TABLET | Freq: Two times a day (BID) | ORAL | 1 refills | 90.00000 days | Status: CP
Start: 2024-06-22 — End: 2024-07-22
  Filled 2024-06-22: qty 180, 90d supply, fill #0

## 2024-06-22 MED ORDER — GABAPENTIN 100 MG CAPSULE
ORAL_CAPSULE | Freq: Three times a day (TID) | ORAL | 0 refills | 30.00000 days | Status: CP | PRN
Start: 2024-06-22 — End: 2024-09-20

## 2024-06-22 MED ORDER — DICLOFENAC 1 % TOPICAL GEL
Freq: Four times a day (QID) | TOPICAL | 1 refills | 13.00000 days | Status: CP
Start: 2024-06-22 — End: 2025-06-22

## 2024-06-22 MED ORDER — SENNOSIDES 8.6 MG TABLET
ORAL_TABLET | Freq: Every evening | ORAL | 2 refills | 30.00000 days | Status: CP
Start: 2024-06-22 — End: 2024-09-20

## 2024-06-22 MED ORDER — POLYETHYLENE GLYCOL 3350 17 GRAM/DOSE ORAL POWDER
Freq: Every day | ORAL | 2 refills | 30.00000 days | Status: CP
Start: 2024-06-22 — End: 2024-09-20

## 2024-06-22 MED ADMIN — furosemide (LASIX) tablet 20 mg: 20 mg | ORAL | @ 12:00:00 | Stop: 2024-06-22

## 2024-06-22 MED ADMIN — acetaminophen (TYLENOL) tablet 650 mg: 650 mg | ORAL | @ 01:00:00

## 2024-06-22 MED ADMIN — ipratropium-albuterol (DUO-NEB) 0.5-2.5 mg/3 mL nebulizer solution 3 mL: 3 mL | RESPIRATORY_TRACT | @ 21:00:00 | Stop: 2024-06-22

## 2024-06-22 MED ADMIN — apixaban (ELIQUIS) tablet 5 mg: 5 mg | ORAL | @ 01:00:00

## 2024-06-22 MED ADMIN — polyethylene glycol (MIRALAX) packet 17 g: 17 g | ORAL | @ 12:00:00 | Stop: 2024-06-22

## 2024-06-22 MED ADMIN — acetaminophen (TYLENOL) tablet 650 mg: 650 mg | ORAL | @ 17:00:00 | Stop: 2024-06-22

## 2024-06-22 MED ADMIN — ipratropium-albuterol (DUO-NEB) 0.5-2.5 mg/3 mL nebulizer solution 3 mL: 3 mL | RESPIRATORY_TRACT | @ 09:00:00 | Stop: 2024-06-22

## 2024-06-22 MED ADMIN — polyethylene glycol (MIRALAX) packet 17 g: 17 g | ORAL

## 2024-06-22 MED ADMIN — acetaminophen (TYLENOL) tablet 650 mg: 650 mg | ORAL | @ 10:00:00 | Stop: 2024-06-22

## 2024-06-22 MED ADMIN — diclofenac sodium (VOLTAREN) 1 % gel 2 g: 2 g | TOPICAL | @ 01:00:00

## 2024-06-22 MED ADMIN — ipratropium-albuterol (DUO-NEB) 0.5-2.5 mg/3 mL nebulizer solution 3 mL: 3 mL | RESPIRATORY_TRACT | @ 13:00:00 | Stop: 2024-06-22

## 2024-06-22 MED ADMIN — diclofenac sodium (VOLTAREN) 1 % gel 2 g: 2 g | TOPICAL | @ 16:00:00 | Stop: 2024-06-22

## 2024-06-22 MED ADMIN — ipratropium-albuterol (DUO-NEB) 0.5-2.5 mg/3 mL nebulizer solution 3 mL: 3 mL | RESPIRATORY_TRACT | @ 03:00:00

## 2024-06-22 MED ADMIN — apixaban (ELIQUIS) tablet 5 mg: 5 mg | ORAL | @ 12:00:00 | Stop: 2024-06-22

## 2024-06-22 MED ADMIN — diclofenac sodium (VOLTAREN) 1 % gel 2 g: 2 g | TOPICAL | @ 10:00:00 | Stop: 2024-06-22

## 2024-06-22 MED ADMIN — cyclobenzaprine (FLEXERIL) tablet 5 mg: 5 mg | ORAL

## 2024-06-22 MED ADMIN — senna (SENOKOT) tablet 2 tablet: 2 | ORAL

## 2024-06-22 MED ADMIN — dilTIAZem (CARDIZEM CD) 24 hr capsule 240 mg: 240 mg | ORAL | @ 12:00:00 | Stop: 2024-06-22

## 2024-06-22 NOTE — Unmapped (Signed)
 WOCN Consult Services                                                 Wound Evaluation: Pressure Injury    Reason for Consult:   - Initial  - Pressure Injury  - Surgical Wound    Problem List:   Principal Problem:    Multifocal pneumonia  Active Problems:    Hypertension    Tobacco use disorder    Hernia, diaphragmatic    Chronic renal disease, stage 2, mildly decreased glomerular filtration rate (GFR) between 60-89 mL/min/1.73 square meter    COPD (chronic obstructive pulmonary disease) with emphysema       Schizoaffective disorder       Pulmonary hypertension       History of bladder cancer    Atrial fibrillation, unspecified type       History of small bowel obstruction    S/P exploratory laparotomy    Subacute pulmonary embolism       History of MDR Pseudomonas aeruginosa infection    Chronic right-sided heart failure       Assessment: 83 y.o. female with large paraesophageal hernia, severe pHTN, pAF, COPD, RVF p/w dyspnea.     CWOCN consult re: sacral Stage 3 pressure injury, abdominal surgical wound.  Patient has a healing Stage 3 pressure injury and a midline surgical wound.  Dressings were already changed by Care RN, so WOCN will see patient at a later date time week of 06/22/24.  Continue POC.    Risk Factors:   - Aging  - Friction/shear  - Immobility  - Moisture  - Multiple co-morbidities    Braden Scale Score: 18       Support Surface:   - Low Air Loss    Type Debridement Completed By KEANE:  N/A    Teaching:  - Moisture management  - Offloading  - Turning and repositioning  - Wound care    WOCN Recommendations:   - See nursing orders for wound care instructions.  - Contact WOCN with questions, concerns, or wound deterioration.  - Continue Skin Integrity Protocol.    Topical Therapy/Interventions:   - VASHE moistened dressings to abdominal wound.  Aquacel Ag and Silicone Foam to sacrum.  Use patient's ostomy supplies for urostomy.    Recommended Consults:  - Not Applicable    WOCN Follow Up:  - Weekly    Plan of Care Discussed With:   - Team    Supplies Ordered: No    Workup Time:   15 minutes    Delon Rummer, RN, BSN, [employer], [employer]  Wound Ostomy Consult Service

## 2024-06-22 NOTE — Unmapped (Signed)
 Shift Summary  Cyclobenzaprine  was administered for muscle spasms, contributing to the patient's calm demeanor.    FLEXERIL  was given PRN, and the patient remained calm following administration.    Dressing on the abdomen wound was changed, indicating ongoing wound care management.    Temperature remained stable throughout the shift, showing no signs of infection.    Overall, the patient maintained a stable condition with effective pain management and wound care.     Absence of Hospital-Acquired Illness or Injury: Side rails were consistently maintained at 3/4 throughout the shift, and alarms were activated and audible, ensuring a safe environment.     Optimal Comfort and Wellbeing: Pain in the left arm was constant and aching, with a pain score of 5, slightly above the stated goal of 4. Cyclobenzaprine  was administered for muscle spasms, and the patient remained calm.     Absence of Infection Signs and Symptoms: Temperature remained stable between 36.5 ??C and 36.6 ??C, and the dressing on the abdomen wound was changed from wet-dry to moist to dry, indicating no signs of infection.

## 2024-06-22 NOTE — Unmapped (Signed)
 You have a fax in your box requiring your signature.

## 2024-06-22 NOTE — Unmapped (Signed)
 Copied from CRM #[id number]. Topic: Scheduling - Confirm Appointment  >> Jun 22, 2024  7:21 AM Luciano BROCKS wrote:  Caller asked to confirm appointment details and needed to reschedule her transitions appointment as she is still in the hospital.    Additional Requests/Needs: No

## 2024-06-22 NOTE — Unmapped (Unsigned)
 Reason for Visit: Hospital Follow-up Medication Management    History of Present Illness:  Tammy Braun is a 83 y.o. female with a past medical history of Afib, COPD, HTN, T2DM, CKD, prior CVA, schizoaffective disorder, bladder cancer w/ ileal conduit (2012), osteoporosis, and SBO s/p small bowel resection (Feb 2025) who was recently hospitalized from 06/03/24 to 06/03/24 for CAP + segmental RLL PE s/p 5-day course of levofloxacin  + resumed Eliquis  w/ 7-day load (previously on for Afib but self-discontinued in May 2025 due to concerns for falls and bleeding risk). Pt presents to the [facility] for follow-up {Blank single:19197::with,without} all of her medication bottles.     Pre-charting  - PMH: Afib, COPD, HTN, T2DM, CKD, prior CVA, CAD (high calcification score), schizoaffective disorder, bladder cancer w/ ileal conduit (2012), osteoporosis, and recent SBO s/p small bowel resection (Feb 2025)  - Insurance: Medicare/Medicaid  - Summary of recent hospital stay:   - Dates admitted: 6/14-6/23   - Reason for admission: CAP + segmental RLL PE   - Pt w/ increased SOB, c/f PNA on imaging > 5 days Levaquin  + repeat CXR w/ improvement   - IV Lasix  for orthopnea + DOE but developed AKI, later resumed PO Lasix  and pt remained euvolemic   - chronic RLL PE noted on CTA > previously on Eliquis  but d/c'd d/t concerns for falls and bleeding risk > resumed Eliquis  w/ 7-day load   - Minimal BM on 6/18 + emesis on 6/19 after breakfast, KUB w/ c/f SBO   - Surg consulted given small bowel resection for SBO in Feb   - CTAP c/f early SBO vs ileus > NPO x24h + 500cc bolus w/ multiple BM's since then   - IV iron  750 mg (Tsat <16%)   - Labs at discharge: Hgb 10.7, plt 320, Scr 0.93, K 4.5, LFTs wnl   - Med changes at discharge: ***   - Specific follow-up items: pulm f/u, sleep study, ongoing conversation about Eliquis  duration (recommended lifelong)  - Other notes:   - Last A1c 5.6% (04/23/24)  - Future considerations:   - Pharmacotherapy for osteoporosis? Hx severe osteo (spine T-score -3.9) and 1 outside note w/ Fosamax in 2012 but unclear if/how long pt actually taking and no mention since then...   - SGLT2i for CKD + DM + ASCVD (stroke hx)?   - Restart ARB (previously on candesartan  but d/c'd during April 2025 admission for low BP) for CKD?   - UACR?   - Resume statin for ASCVD risk (clarify CVA hx?)?    Since discharge, ***    Questions for today:  - SOB/orthopnea improved/resolved?  - BP/HR?  - Resume ARB and statin as able and w/ education?  - S/sx bleeding? Adherence w/ Eliquis ?  - Reconsider med for severe osteoporosis??  - Inhaler technique (albuterol  PRN only)?    Medication Adherence and Access:  Missed doses?: {Blank multiple:19197::yes,no,***}  Uses pillbox?: {Blank multiple:19197::yes,no,***}  Anyone else assist with medication organization? {Blank multiple:19197::yes,no,***}  Current insurance coverage: Medicare/Medicaid  Preferred Pharmacy: ***   Medications affordable?: {Blank multiple:19197::yes,no,***}  Needs refills? {Blank multiple:19197::yes,no,***}    Allergies:   Allergies[1]    Medications: Medications reviewed in EPIC medication station and updated today by the clinical pharmacist practitioner.  Medications Ordered Prior to Encounter[2]    Assessment and Plan:     # ***      # ***       # Medication Management   Encouraged use of  medication pill box and reviewed appropriate use. Reviewed the indication, dose, and frequency of each medication with patient.       Recommendations and medication-related problems were discussed directly with the patient's transitions of care physician, Dr. Lyle, immediately following the pharmacist visit prior to the physician visit. Questions/concerns were addressed to the patient's satisfaction. I spent a total of {NUMBERS; 0-45 BY 5:10291} minutes {VISIT UBEZ:24588} with the patient delivering clinical care and providing education/counseling.      Future Appointments   Date Time Provider Department Center   06/22/2024  1:00 PM FAMMED TRANSITION CLINIC [facility]   06/22/2024  1:40 PM Lyle Waddell HERO, MD [facility]   06/25/2024 12:30 PM Leanora Reena ORN, PT [facility]   06/26/2024 11:15 AM Delores Bare, MD [facility]   06/29/2024 10:45 AM Terresa Collar, AGNP [facility]   07/06/2024 11:30 AM [facility] VILCOM NURSE [facility]   07/09/2024 10:00 AM Mazzola Woodfin everitt Solon Unice, MD [facility]   07/22/2024 11:40 AM Elige [name], MD [facility]   10/28/2024 12:30 PM Claudene Cesar HERO, MD [facility]     ___________________________________________________   Therisa Crisp, PharmD, CPP, [facility]  Family Medicine Clinical Pharmacist         [1]   Allergies  Allergen Reactions    Lisinopril Anaphylaxis and Swelling    Losartan  Dizziness    Hctz [Hydrochlorothiazide ]      SIADH   [2]   Current Facility-Administered Medications on File Prior to Visit   Medication Dose Route Frequency Provider Last Rate Last Admin    acetaminophen  (TYLENOL ) tablet 650 mg  650 mg Oral Q8H Losapio, Delia R, MD   650 mg at 06/22/24 9386    albuterol  (PROVENTIL  HFA;VENTOLIN  HFA) 90 mcg/actuation inhaler 2 puff  2 puff Inhalation Q4H PRN Vinita Vernell BRAVO, MD        apixaban  (ELIQUIS ) tablet 5 mg  5 mg Oral BID Losapio, Delia R, MD   5 mg at 06/22/24 9176    cyclobenzaprine  (FLEXERIL ) tablet 5 mg  5 mg Oral BID PRN Losapio, Delia R, MD   5 mg at 06/21/24 2029    diclofenac  sodium (VOLTAREN ) 1 % gel 2 g  2 g Topical QID Losapio, Delia R, MD   2 g at 06/22/24 9385    dilTIAZem  (CARDIZEM  CD) 24 hr capsule 240 mg  240 mg Oral Daily Losapio, Delia R, MD   240 mg at 06/22/24 9176    furosemide  (LASIX ) tablet 20 mg  20 mg Oral Daily Monuszko, Darice LABOR, MD   20 mg at 06/22/24 9176    [Provider Hold] gabapentin  (NEURONTIN ) capsule 100 mg  100 mg Oral TID Claudene Elsie NOVAK, MD   100 mg at 06/17/24 2029    ipratropium-albuterol  (DUO-NEB) 0.5-2.5 mg/3 mL nebulizer solution 3 mL  3 mL Nebulization Q6H (RT) Claudene Elsie NOVAK, MD   3 mL at 06/22/24 0510    polyethylene glycol (MIRALAX ) packet 17 g  17 g Oral BID Losapio, Delia R, MD   17 g at 06/22/24 9176    senna (SENOKOT) tablet 2 tablet  2 tablet Oral Nightly Losapio, Delia R, MD   2 tablet at 06/21/24 2029     Current Outpatient Medications on File Prior to Visit   Medication Sig Dispense Refill    acetaminophen  (TYLENOL ) 500 MG tablet Take 1 tablet (500 mg total) by mouth every six (6) hours. 30  tablet 0    albuterol  HFA 90 mcg/actuation inhaler Inhale 2 puffs every four (4) hours as needed for wheezing. 18 g 11    aspirin  325 MG delayed release (EC) tablet 1 po every day; may take a second tablet when needed. 100 tablet 3    blood sugar diagnostic (GLUCOSE BLOOD) Strp Disp test strips preferred by insurance plan. Testing qday, Dx: E11.9 (Type 2 DM- controlled) 30 strip 11    blood-glucose meter kit Disp. blood glucose meter kit preferred by patient's insurance. Check blood sugars as directed by provider. Dx: Diabetes, E11.9 1 each 1    dilTIAZem  (CARDIZEM  CD) 240 MG 24 hr capsule Take 1 capsule (240 mg total) by mouth daily. 90 capsule 3    fluPHENAZine  decanoate (PROLIXIN ) 25 mg/mL injection Inject 2 mL (50 mg total) into the muscle every thirty (30) days. 5 mL 11    furosemide  (LASIX ) 20 MG tablet Take 1 tablet (20 mg total) by mouth daily. 30 tablet 5    gabapentin  (NEURONTIN ) 100 MG capsule Take 1 capsule (100 mg total) by mouth Three (3) times a day. 90 capsule 2    inhalational spacing device (E-Z SPACER) Spcr 1 each by Miscellaneous route every four (4) hours as needed. 1 each 0    lancets Misc Disp. lancets #30 or amount allowed, Testing once Qday. Dx: E11.9 (Diabetes- controlled) 30 each 11    MEDICAL SUPPLY ITEM Entreal formula nutritionally supplement complete caloric density 3 application 3    MEDICAL SUPPLY ITEM Compression stockings knee high 2 each 0    miscellaneous medical supply Misc 1 each by Miscellaneous route four (4) times a day. 100 each 0    underpads 2.6 X 2.9 feet Pads 1 each by Miscellaneous route two (2) times a day. 100 each 3

## 2024-06-22 NOTE — Unmapped (Cosign Needed)
 [facility] FAMILY MEDICINE [place] POPULATION HEALTH  Care Management Progress Note    Date: 06/22/2024  Outcome:  Phone outreach completed    Purpose of contact:           CM received message from patient's daughter regarding moving transitions clinic, as patient's hospital discharge was postponed. Patient will be discharged at some point today.    Patient's daughter is requesting something to check her mother's oxygen. Currently, concerned about her O2 sats and is concerned she may need oxygen as she is moving around. Reported goal is to eventually wean off of it. Her O2 goes down when moving around and daughter is concerned about this at home.     Reschedule transitions: appointment now Thursday 6/26 at 10:40am.  Per chart, appears oxygen order placed for delivery to patient's room. That may be the old order - states may need intermittent oxygen per daughter's conversation with provider this AM. Patient's daughter will reach out to the nurse and inquire regarding if there is a need for new/different orders.  Daughter states would like a pulse oximeter to detect changes in mom's o2 levels and determine need for intermittent oxygen support. Pulse oximeter may be an option via CAP waiver. Order may be able to be placed and sent to CAP case manager at [employer]. Patient's daughter advised they are very short-staffed and would not recommend just sending the order. Patient states she will call her case manager and if not able to reach her, CM will call as well.    Usually using [facility] - [phone] - CM contacted [facility], who shared they only offer pulse oximeter as cash pay (do not bill for it, even with CAP).    Update: CM received message from inpatient care team regarding patient's DME needs and requests. Initially unclear if they are able to get an oxygen machine.    Called patient to follow up - states she got portable oxygen concentrator, with bag and backpack and has been trained on how to use it. Per patient's daughter, still needs a pulse oximeter and bedside commode that is flat so it does not irritate pressure sores. Best contact for CAP case manager is Dwayne Anon [phone]. States that they will be discharged today but are trying to sort out the details. Shared with daughter that they do have pulse oximeters at [facility], but they will not bill insurance and they are over-the-counter/self pay.     CM contacted Randine to discuss physician's orders for CAP approval. Per Randine, physician orders can be sent, but CAP is not an emergency/crisis resource and it is unlikely orders can be sent immediately/urgently as patient is over-budget and this requires extra work regarding the financials. CM available to support with this once patient is discharged. Either can request an order from PCP or this order can be placed in transitions clinic. Patient can also self-pay OTC.     Update 6/24: patient discharged, o2 and commode orders placed by hospital staff. CM contacted PCP via staff message regarding placing a pulse oximeter order to send to CAP case manager.     Additional Information/Plan:  Patient provided my direct contact information and encouraged to contact me should additional needs arise.    Time Spent Per Day:  Chart review was completed prior to outreach attempt.   06/22/2024: 70  06/23/2024: 10    [name], MSW  [facility] FAMILY MEDICINE [place]

## 2024-06-23 DIAGNOSIS — G603 Idiopathic progressive neuropathy: Principal | ICD-10-CM

## 2024-06-23 DIAGNOSIS — I10 Essential (primary) hypertension: Principal | ICD-10-CM

## 2024-06-23 NOTE — Unmapped (Cosign Needed)
 [facility] FAMILY MEDICINE [place] POPULATION HEALTH  Care Management Progress Note    Date: 06/23/2024  Outcome:  Phone outreach completed    Purpose of contact:           Patient's daughter [name] had called CM and left VM after hours.     CM returned call to speak with [facility]. Patient was discharged last night. Per daughter, patient did receive needed DME except for the commode (3-in-1 commode with soft seat) due to availability and the pulse ox due to insurance coverage.    Patient's daughter states she did not want to leave the hospital without the pulse oximeter. Explained to patient's daughter that unfortunately they are not covered by insurance so the only option to get one paid for by a third party would be to send a physician's order to CAP case manager for review. CM informed patient that staff message was sent to PCP for an order and CM will remain available to coordinate this. Additionally, order can be placed when patient attends transitions clinic on 6/26.    CM to follow up with [facility] via email regarding delivery of commode in outpatient setting and confirm they have available.   Email sent to [email or web address]  Will continue to check to see if orders placed.    Additional Information/Plan:  Patient provided my direct contact information and encouraged to contact me should additional needs arise.    Time Spent Per Day:  Chart review was completed prior to outreach attempt.   06/23/2024: 15    [name], MSW  [employer] FAMILY MEDICINE [place]

## 2024-06-23 NOTE — Unmapped (Signed)
 Copied from CRM #[id number]. Topic: Care Management - Discuss Care Plan  >> Jun 23, 2024 12:10 PM Almeda NOVAK wrote:      Erskin Bread contacted the Communication Center requesting to speak with the care team of Tammy Braun to discuss:    Returning missed call from RN Armida Flakes    Please contact Tammy Braun  at [phone].    Thank you,   Almeda GORMAN Avena  [facility] Communication [facility]   [phone]

## 2024-06-25 ENCOUNTER — Encounter: Admit: 2024-06-25 | Discharge: 2024-06-25 | Payer: MEDICARE

## 2024-06-25 ENCOUNTER — Ambulatory Visit: Admit: 2024-06-25 | Payer: MEDICARE

## 2024-06-25 DIAGNOSIS — I4891 Unspecified atrial fibrillation: Principal | ICD-10-CM

## 2024-06-25 DIAGNOSIS — J439 Emphysema, unspecified: Principal | ICD-10-CM

## 2024-06-25 DIAGNOSIS — I10 Essential (primary) hypertension: Principal | ICD-10-CM

## 2024-06-25 DIAGNOSIS — F17200 Nicotine dependence, unspecified, uncomplicated: Principal | ICD-10-CM

## 2024-06-25 DIAGNOSIS — I639 Cerebral infarction, unspecified: Principal | ICD-10-CM

## 2024-06-25 DIAGNOSIS — M810 Age-related osteoporosis without current pathological fracture: Principal | ICD-10-CM

## 2024-06-25 DIAGNOSIS — G629 Polyneuropathy, unspecified: Principal | ICD-10-CM

## 2024-06-25 DIAGNOSIS — N182 Chronic kidney disease, stage 2 (mild): Principal | ICD-10-CM

## 2024-06-25 DIAGNOSIS — M4802 Spinal stenosis, cervical region: Principal | ICD-10-CM

## 2024-06-25 DIAGNOSIS — I50812 Chronic right heart failure: Principal | ICD-10-CM

## 2024-06-25 DIAGNOSIS — J189 Pneumonia, unspecified organism: Principal | ICD-10-CM

## 2024-06-25 DIAGNOSIS — M79604 Pain in right leg: Principal | ICD-10-CM

## 2024-06-25 DIAGNOSIS — E1149 Type 2 diabetes mellitus with other diabetic neurological complication: Principal | ICD-10-CM

## 2024-06-25 DIAGNOSIS — J432 Centrilobular emphysema: Principal | ICD-10-CM

## 2024-06-25 DIAGNOSIS — M79605 Pain in left leg: Principal | ICD-10-CM

## 2024-06-25 DIAGNOSIS — R531 Weakness: Principal | ICD-10-CM

## 2024-06-25 LAB — CBC
HEMATOCRIT: 37.9 % (ref 34.0–44.0)
HEMOGLOBIN: 12.3 g/dL (ref 11.3–14.9)
MEAN CORPUSCULAR HEMOGLOBIN CONC: 32.5 g/dL (ref 32.0–36.0)
MEAN CORPUSCULAR HEMOGLOBIN: 26.8 pg (ref 25.9–32.4)
MEAN CORPUSCULAR VOLUME: 82.5 fL (ref 77.6–95.7)
MEAN PLATELET VOLUME: 8.6 fL (ref 6.8–10.7)
PLATELET COUNT: 329 10*9/L (ref 150–450)
RED BLOOD CELL COUNT: 4.59 10*12/L (ref 3.95–5.13)
RED CELL DISTRIBUTION WIDTH: 18.8 % — ABNORMAL HIGH (ref 12.2–15.2)
WBC ADJUSTED: 7.3 10*9/L (ref 3.6–11.2)

## 2024-06-25 LAB — BASIC METABOLIC PANEL
ANION GAP: 7 mmol/L (ref 5–14)
BLOOD UREA NITROGEN: 22 mg/dL (ref 9–23)
BUN / CREAT RATIO: 20
CALCIUM: 10 mg/dL (ref 8.7–10.4)
CHLORIDE: 101 mmol/L (ref 98–107)
CO2: 27 mmol/L (ref 20.0–31.0)
CREATININE: 1.11 mg/dL — ABNORMAL HIGH (ref 0.55–1.02)
EGFR CKD-EPI (2021) FEMALE: 50 mL/min/{1.73_m2} — ABNORMAL LOW (ref >=60)
GLUCOSE RANDOM: 186 mg/dL — ABNORMAL HIGH (ref 70–179)
POTASSIUM: 5.1 mmol/L — ABNORMAL HIGH (ref 3.4–4.8)
SODIUM: 135 mmol/L (ref 135–145)

## 2024-06-25 MED ORDER — DILTIAZEM CD 120 MG CAPSULE,EXTENDED RELEASE 24 HR
ORAL_CAPSULE | Freq: Every day | ORAL | 3 refills | 100.00000 days | Status: CP
Start: 2024-06-25 — End: ?

## 2024-06-25 MED ADMIN — acetaminophen (TYLENOL) tablet 650 mg: 650 mg | ORAL | @ 17:00:00 | Stop: 2024-06-25

## 2024-06-25 NOTE — Unmapped (Addendum)
 Decrease diltiazem  to 120mg  once daily  Follow up in 4 weeks with Dr Elige to discuss adding ARB  Start vitamin D (cholecalciferol ) 1000 units once daily. You can purchase this over the counter  In the future, discuss management of osteoporosis with endocrinology. A referral was placed

## 2024-06-25 NOTE — Unmapped (Signed)
 Transitions of Care Note    Subjective      Admission Date: 06/13/24  Discharge Date: 06/22/24  Discharge Hospital/Unit: 2 [facility]  The patient was discharged [facility] and sent to her home.    Post discharge interactive communication via telephone was made with patient on 06/22/2024 and I have reviewed the information from that communication.    Today's (06/25/2024) face to face interactive visit is within 7 days days of discharge.    Chief Complaint: Patient is here in follow-up to their recent hospitalization and to discuss the following medical problems:     HISTORY OF PRESENT ILLNESS:    Tammy Braun is a 83 y.o. female who presents for transitions hospital follow up.    Patient initially presented to the hospital on 6/14 for 2 days of worsening shortness of breath with lying flat and walking was found to have fluid overload, new pulmonary embolism and multifocal pneumonia.  For the right ventricular failure she was initially diuresed with IV Lasix  during which she developed an AKI and was sent home on her usual p.o. Lasix  on discharge.  She completed a course of Levaquin  for her multifocal pneumonia.  Given that this was her second pulmonary embolism, recommendations were to continue Eliquis  indefinitely.  She was initially on heparin  for 24 hours then transition to Eliquis  10 mg x 7 days and then transition to 5 mg twice daily.    Of note, her echocardiogram 03/11/2024 revealed EF 70% but moderate ridley reduced systolic function of the right heart with severe pulmonary hypertension.    Since discharge patient has had ngoing swelling, some sensation of feeling whoozy and fatigue. Her breathing has been ok.     Patient uses pill box yes    PHQ-9 PHQ-9 Total Score   01/31/2024   9:30 AM 5    04/19/2023  10:00 AM 12    11/02/2022  11:00 AM 10    07/27/2022  10:00 AM 10    04/13/2022  11:00 AM 0    09/23/2020  10:00 AM 4    10/18/2017  10:00 AM 10        Data saved with a previous flowsheet row definition       The remaining 10 systems reviewed were negative.      Patient has been seen by the pharmacist today and I have reviewed their note and recommendations. See pharmacist note for details.  Patient has been seen by the social worker today and I have reviewed their note and recommendations.  See social work note for details.    I have reviewed the patients discharge summary for this hospitalization.  I have also reviewed the problem list, allergies, family and social history and updated them as needed.         Objective     Ms. Heffler  weight is 61.3 kg (135 lb 2.3 oz). Her temporal temperature is 36.2 ??C (97.2 ??F). Her blood pressure is 116/70 and her pulse is 71.     GEN: well appearing, NAD   HEENT: NCAT, No scleral icterus. Conjunctiva non-erythematous. MMM.   CV: Regular rate and rhythm. No murmurs/rubs/gallops.   Pulm: Normal work of breathing on RA. CTAB. No wheezing, crackles, or rhonchi.   Abd: Flat.  Nontender. No guarding, rebound.  Normoactive bowel sounds.     Neuro: A&O x 3. No focal deficits.   Ext: Trace peripheral edema.  Palpable distal pulses.   Skin: No obvious rashes  or skin lesions.    Labs:  I have reviewed the labs from this hospitalization and the ones on the day of discharge and have followed up on any pending labs at the time of discharge.  See Epic Labs section for details.         Assessment/Plan:     Problem List Items Addressed This Visit       Tobacco use disorder (Chronic)    Chronic renal disease, stage 2, mildly decreased glomerular filtration rate (GFR) between 60-89 mL/min/1.73 square meter    COPD (chronic obstructive pulmonary disease) with emphysema       Multifocal pneumonia - Primary    Chronic right-sided heart failure           Medications prescribed or ordered upon discharge were reviewed on 06/25/2024 and reconciled with the most recent outpatient medication list. I have reviewed and agree with this medication reconciliation.    Pulmonary HTN - COPD  Established with pulmonology. Currently reports improved breathing. Currently tolerating PRN albuterol . Has follow up scheduled for 09/2024 with pulmonology and cardiology 06/2024.    - Sleep Study discussion deferred at this time due to other patient concerns   - Follow with Pulm and Cardiology as scheduled   - Euvolemic today, no changes to lasix  regimen   - Continue PRN albuterol  per pulm recs    Chronic Right Sided Heart Failure  ECHO 02/2024 with moderately reduced right ventricular systolic function. Currently on lasix  20mg  and weight is comparable to admission weight at 135lbs. Euvolemic on exam.    - Follow up with cardiology 06/2024 for RVF and pHTN    Pulmonary Embolism  Currently on RA. Discussed lifelong eliquis . Patient agrees.     PAF  Rate controlled today. On eliquis . Continue current meds.     Tobacco Use Disorder  Not currently smoking. CTM    Peripheral Neuropathy  Not controlled, side effects to gabapentin  in the hospital.     Osteoporosis  Osteoporosis Incomplete treatment; declines new agent at this time. Would like to discuss with endocrinology given side effects of bisphosphonates. Previously treated with fosamax in 2012 per outside EHR documentation.       The following medications changes were made:    - Decrease diltiazem  to 120mg  due to lower blood pressures and fatigue/dizziness daughter and patient attributes to this increase   - Start OTC vitamin D per patient and daughter request    Biggest Risk for Readmission: AKI    Items to follow-up on next visit:    - Evaluate BP and consider adding ARB   - Assess need for furosemide    - Repeat BMP    Medical decision making was of high (00503- must be seen within 7 days) complexity.    Necessary referral have been made.  See Visit Summary for details of referrals.    I will forward my plan and recommendations to patients PCP, Elige [name], MD    Follow-up with PCP or another provider has been scheduled:   Future Appointments   Date Time Provider Department Center   06/25/2024 11:20 AM Isaiah Lynwood Simpers, MD [facility]   06/25/2024 12:30 PM Leanora Reena ORN, PT [facility]   06/26/2024 11:15 AM Delores Bare, MD [facility]   06/29/2024 10:45 AM Terresa Collar, AGNP [facility]   07/06/2024 11:30 AM [facility] VILCOM NURSE OPT[facility]ilcom [facility]   07/09/2024 10:00 AM Mazzola Woodfin everitt Solon Unice, MD [facility]  07/15/2024 10:30 AM Ramm, Calton Idell ELNITA DAYTON [facility]   07/22/2024 11:40 AM Elige [name], MD [facility]   10/28/2024 12:30 PM Claudene Cesar HERO, MD [facility]        Total time spent face to face with the patient was 40 minutes of  which 40 minutes were spent counseling/coordinating care regarding her: recent hospitalization and the following conditions: see above         [facility]  at [facility]  [address], [place], [place] [address]  Telephone [phone]  Fax [phone]  [email or web address]

## 2024-06-25 NOTE — Unmapped (Signed)
 [facility] Administered Medication Refill Coordination Note      NAME:Anuradha Adaysha Dubinsky DOB: 1941-05-24      Medication: fluphenazine  decanoate    Day Supply: 30 days       SHIPPING      Next delivery from [facility] [phone]) to [facility] for Kandas Oliveto is scheduled for 07/03    Clinic contact: [name]     Patient's next nurse visit for administration: 07/07    We will follow up with clinic monthly for standard refill processing and delivery.      Jervis Trapani LITTIE Hope  [facility] [place]

## 2024-06-25 NOTE — Unmapped (Signed)
 Feeling woozy and weak; pretty consistent since discharge     Asked discharge     Fluid retention // how do we keep it off.     Can dr give her tylenol      BP meds    Ph. [facility] [phone]    Bedside commode for someone w/ pressure sores     Family Medicine  Care Management Transitions of Care Note    Presenting Problem:  Tammy Braun has been identified as a Transitions patient who is at risk for readmission.    Tammy Braun is 83 y.o. female with a past medical history of COPD, HF, HTN, DM who presents to [facility] 3 days post discharge, accompanied by her daughter. She was hospitalized for multifocal pneumonia. She reports feeling woozy and nauseous since discharge. Their primary concern today is to further discuss how to prevent fluid retention and how to better treat her HTN.     Assessment:  Social History[1]      Home Health Services: [facility]   Current Home Health Agency:  New order: no  DME:   DME Agency: [facility]   Equipment: oxygen   New order: no  **of note: Mliss requested this CM speak on the phone with Sam from [facility]. She is hoping to get a pulse ox through them. Sam reports that this item will be fully covered by insurance. Sam will be faxing an order to us  for signature.     Patient is also requesting a bedside commode that also accommodates for bedsores. CM spoke with patient's CCM care manager, Royce Pinal, who has already gotten this over to [facility].   Personal Care Service/Personal Aide:    PCS Agency: [facility]   New order: no      Behavioral Health:  PHQ9: was not completed  GAD7: was not completed  Behavioral Health Provider: [facility]    Advanced Directives: does  have on file. Health Care Decision Maker was not completed today.    Intervention:  [x]  Introduced self and role at [facility].  [x]  Reviewed hospital discharge follow-up instructions.  [x]  Reviewed same day or after hours care options.  [x]  Provided this CM's contact information and encouraged patient to reach out should additional needs arise.       Lauraine Ronnald Dural, LCSW  Care Manager  [facility]  [phone]      Upcoming appointments:  Future Appointments   Date Time Provider Department Center   06/25/2024 12:30 PM Leanora Reena ORN, [place] UNCPTMM [facility]   06/26/2024 11:15 AM Delores Bare, MD PSYSTEPGRNB [facility]   06/29/2024 10:45 AM Terresa Lamarr BODILY UNCUROS[facility]ET [facility]   07/06/2024 11:30 AM Arley [facility] NURSE OPTCVilcom [facility]   07/09/2024 10:00 AM Mazzola Woodfin everitt Solon Unice, MD UNCHGACSU [facility]   07/15/2024 10:30 AM Ramm, Calton Idell BODILY [place] [facility]   07/22/2024 11:40 AM Elige [name], MD [facility]   10/28/2024 12:30 PM Claudene Cesar HERO, MD UNCPULSPCLET [facility]              [1]   Social History  Socioeconomic History    Marital status: Widowed   Tobacco Use    Smoking status: Former     Current packs/day: 0.50     Types: Cigarettes    Smokeless tobacco: Never    Tobacco comments:     10cpd, quit about 2 months ago (April 2025)   Vaping  Use    Vaping status: Never Used   Substance and Sexual Activity    Alcohol use: No     Alcohol/week: 0.0 standard drinks of alcohol    Drug use: No    Sexual activity: Not Currently     Social Drivers of Health     Financial Resource Strain: Low Risk  (01/06/2024)    Overall Financial Resource Strain (CARDIA)     Difficulty of Paying Living Expenses: Not hard at all   Food Insecurity: No Food Insecurity (01/06/2024)    Hunger Vital Sign     Worried About Running Out of Food in the Last Year: Never true     Ran Out of Food in the Last Year: Never true   Transportation Needs: No Transportation Needs (01/06/2024)    PRAPARE - Therapist, art (Medical): No     Lack of Transportation (Non-Medical): No   Physical Activity: Inactive (08/12/2023)    Exercise Vital Sign     Days of Exercise per Week: 0 days     Minutes of Exercise per Session: 0 min Stress: Stress Concern Present (08/12/2023)    [employer] of Occupational Health - Occupational Stress Questionnaire     Feeling of Stress : To some extent   Social Connections: Moderately Isolated (08/12/2023)    Social Connection and Isolation Panel     Frequency of Communication with Friends and Family: More than three times a week     Frequency of Social Gatherings with Friends and Family: Once a week     Attends Religious Services: More than 4 times per year     Active Member of [employer] or Organizations: No     Attends Banker Meetings: Never     Marital Status: Widowed   Housing: Low Risk  (01/06/2024)    Housing     Within the past 12 months, have you ever stayed: outside, in a car, in a tent, in an overnight shelter, or temporarily in someone else's home (i.e. couch-surfing)?: No     Are you worried about losing your housing?: No

## 2024-06-25 NOTE — Unmapped (Addendum)
 [place] [facility]  EVALUATION AND LETTER OF MEDICAL NECESSITY    Patient Name: Tammy Braun, Tammy Braun     Date of Birth:November 23, 1941  Date: 06/25/2024  Session Number:  1  Therapy Diagnosis:   Encounter Diagnoses   Name Primary?    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     Primary hypertension     Pulmonary emphysema, unspecified emphysema type         Referring Practitioner: Tammy Braun    Onset of Injury/ Diagnosis: March 2025 - significant decline in mobility following 3 week hospitalization from small bowel obstruction  Date of Evaluation: 06/25/2024    Certification Dates: N/A PT Wheelchair Eval only     Insurance:     Primary: Medicare  Secondary: Medicaid     Interpreter present: No    Weight: 135 lbs   Height: 5'      Assessment/Medical History   Reason for Referral/History of Present Condition/Onset of injury/exacerbation:   Tammy Braun is a 83 y.o. female who presented to the Outpatient Wheel[facility] Clinic to pursue a power wheelchair purchase. She has been requiring a power wheelchair for her declining mobility for the past several years and did attempt to pursue one back in September of 2022 and December of 2023 from [employer] She does not recall what happened with the initial pursue of the power wheelchair in September 2022 but never received it, which is why she decided to try again in December 2023 from a medical supply store. However, due to worsening health conditions, the wheelchair that was initially suggested by [employer] was declined by the family as it did not provide the positioning needs that the patient required. When the patient was hospitalized this past March 2024 for several weeks due to a small bowel obstruction, she was no longer ambulatory due to prolonged deconditioning and weakness, and was unable to receive a wheelchair as it was already on record that a wheelchair was paid for, although it was not picked up. She is currently using a standard wheelchair that was purchased out of pocket however is not able to alleviate her pressure, thus contributing to her pressure injury, and she is unable to propel the wheelchair herself. She requires a wheelchair for all of her mobility and thus needs a custom fit power wheelchair. Today, we assessed the patient's mobility needs and have found the following:    Medical hx/conditions/surgical procedures:  Past Medical History[1]    Recent or Planned Procedures: none    Patient was given an opportunity to choose an appropriate vendor.    Tammy Braun, ATP from [employer] was present and participated in the evaluation and recommendation process.    Patient???s communication preference: Verbal, Written, and Visual    Barriers to Learning: hearing deficit, pain    Precautions: Fall Risk  Red Flags:  none    Clinical Decision Making: Data from the patient???s history and examination indicates the presence of 3 or more personal factors and/or comorbidities that will impact the plan of Braun for the current problem, including Diabetes as this effects pt's ability to tolerate exercises, can cause neuropathy, and decrease healing time. Hypertension as this can effect pt's exercise tolerance as exercise generally increases hypertension. Pulmonary issues as this can cause fatigue and require increased rest breaks. Examination of body systems includes 4 or more  body structures,  functions, activity limitations and/or participation restrictions including musculoskeletal, UE, LE, functional mobility, and self Braun. The assessed clinical presentation is stable based on PT assessment and co-morbidities. These factors result in the need for high clinical decision making.    Problem List: impaired balance, decreased ROM, deconditioning, impaired ambulation, impaired transfers, and impaired self Braun    Goals:    Patient/Family Goals: I want a chair that doesn't hurt        Goals for the Wheelchair: (Pt/family participated in goal setting)   - Pt will be fitted for a power wheelchair for independence throughout the home and community.   - Pt will negotiate a power wheelchair within the home independently.    Goals For the Seating System:    - The recommended cushion will optimize pressure distribution  - The recommended seating system will provide support needed to facilitate function/safety.  - The recommended seating system will provide corrective forces to assist with maintaining or improving posture  - The recommended seating system will enhance physiological function such [id number], swallowing, reduction of edema, reduction of pain.      Prognosis:  Good with purchase of a wheelchair   Positive Indicators: good caregiver/family support   Negative Indicators: age, multiple co-morbidities, and overall health status    Plan     Pt. will participate in: Wheelchair management/training, Self Braun   Patient will participate in face to face evaluation (currently scheduled for 7/23, however may benefit from additional formal face to face) with physician,  home evaluation (as needed) to assess home accessibility and patient's ability to safely use the recommended equipment in the home.     Date/Time of Clinic: No follow up needed     Subjective: My bottom hurts     Social History     Tammy Braun lives with Tammy Braun, Tammy Braun and Tammy Braun. Tammy Braun going off to school in Fall 2025  Caregiver availability, capability, willingness: wound nurse 2x a week, home health therapy and respite Braun.     Home Accessibility:    Tammy Braun lives in One level house,, Ramped entrance  Bathroom setup: Other: Tammy Braun Tammy Braun was supposed to modify shower, but never completed. Walk in shower but has a lip that she can't cross over     Surfaces around Home:   Home interior: carpet and vinyl, tile in bathroom  Home Exterior: Grass    Prior Functional Status: Assist with Mobility and ADLS, able to ambulate short distances from chair to bed but non ambulatory    Current Functional Status:  Dependent with Mobility and ADLs  Last Fall: none    Equipment     Current Wheelchair:    - Vendor: none  - Model: Drive medical K3 wheelchair  - Age of Wheelchair: 3 months old  - Condition of Current Wheelchair: Fair  - Posture in Current Wheelchair: right lateral lean, posterior pelvic tilt, hips adducted due to wide footplates  - Storage of Wheelchair:  inside the home    - Likes of current seating system- family can move her around and is not stuck in the bed  - Dislikes about current seating system- can't      Current Wheelchair Cushion:   - Type: none, foam pad  - Condition of Current Cushion: flat  - Age of Current Cushion: unknown    Additional Equipment: hoyer lift, hospital bed     Activities of Daily Living (ADLs)     Cognitive Status Intact/Impaired Comments  Memory Skills Intact    Problem Solving Impaired    Judgment Impaired    Attn./Concentration Intact    Vision Impaired Needs glasses      ADL STATUS Assistance Level Comments/Other AT Equipment Required   Dressing Dependent    Bathing Dependent    Feeding Independent    Hygiene/Grooming Requires Assistance Able to wash face   Toileting Dependent    Bladder Management Stoma for urine      Bowel Management  Incontinent    Meal Prep Dependent    Home Mgmt. Dependent      Community ADLs     Primary Mode of Transportation:  Holiday representative    Employment: Retired    Programmer, multimedia: spends time with family    Mat Evaluation:       Seated Alignment on mat table    rounded shoulders and foward head     Pelvis:  Neutral   Hip Alignment: Hips in Neutral with hip abduction ~10 degrees  Spine: Neutral  Shoulders: Rotated forward    Head Control: Good, Independent  Trunk Control: Good, Independent     Sensation:   Light touch: Impaired from diabetic neuropathy    Skin Integrity:    History of Pressure Sores:  yes pressure injury on coccyx and buttocks      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               Left: shoulder flexion limited to 90 degrees due to history of shoulder replacement, little to no                Strength:   Muscle grading scale 0-5  UPPER EXTREMITY Right Left   Shoulder flexion   3 2+   Shoulder abduction 3 2   Elbow flexion 3 4   Elbow extension 3 4   Wrist flexion 3 4   Wrist extension 3 4   Finger flexion 3 4   Finger extension 3 4         Lower Extremity ROM:               Right: Grossly within functional limits              Left:  Grossly within functional limits                    LOWER EXTREMITY Right Left   Hip flexion   0 0   Knee flexion 0 0   Knee extension 0 0   Ankle dorsiflexion 0 0   Ankle plantarflexion 0 0     Fine Motor Control/Coordination:         Right Hand Normal thumb to finger movement        Left Hand Impaired thumb to finger movement    Tone/Spasticity:   UE's WNL    LE's Increased Flexion spasticity   Trunk WNL  Clonus Present: No.    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   Operate Power wheelchair with Standard Joystick Independent    Able to Perform Weight Shifts/Pressure Relief Dependent Type: unable to perform partial lift/wheelchair push up or lateral lean, must use power wheelchair joystick with power tilt to off load pressure   Hour Spent Sitting in w/c Each Day ~2 hours but mostly all 10-12 hours     Sitting Balance Min Support    Standing Balance Unable      Cardio-respiratory status: SOB with activity, wears oxygen at night     Clinical Criteria/Algorithm Summary:      Is there a mobility limitation causing an inability to safely participate in one or more Mobility Related Activities of Daily Living in a reasonable time frame? Yes.  - Patient is bed confined without the use of a wheelchair? Yes  - This wheelchair is necessary in order for the patient to complete their MRADLS? Yes  Are there cognitive or sensory deficits (awareness/judgment/vision/etc) that limit the user's ability to safely participate in one or more MRADL's/ADLs? No.  Does the user demonstrate the ability or potential ability and willingness to safely use the mobility assistive device? Yes.  Can the mobility deficit be sufficiently resolved with only the use of a cane or walker? No, Explanation: non ambulatory due to weakness in BLEs  Does the user's environment support the use of a Power wheelchair? Yes.  If a manual wheelchair is recommended, does the user have sufficient function/ability to use the recommended equipment? No.  If a POV is recommended, does the user have sufficient stability and upper extremity function to operate it? No.  If a power wheelchair is recommended, does the user have sufficient function/abilities to use the recommended equipment? Yes.     Education:  Patient educated on equipment, proper use, safety  Demonstrated understanding and teach back during session    Patient in agreement with plan of Braun?:yes    Treatment Rendered:    Evaluation: 45 min   Wheelchair Management & Training: 45 mins  Trial and educated patient and Tammy Braun on J16E (Group 2 base) with power tilt, power elevating legrests, and seat elevate and allows for rehab seating (ie. Pressure relieving and skin protection cushion)   Lengthy time spent educating patient on the seat features and how to access seat features via the joystick and trial of Essence SPP and Axiom SP cushion  Coordination of Braun with MD, MSW, and patient's Tammy Braun to clarify previous attempts of obtaining PWC - 2 units PT NON BILL  LMN PT NON BILL 2 units     Equipment Recommendations:     Our interdisciplinary team assessment of Cataleia???s seating and mobility needs determined that the following optimally configured power wheelchair is the most reasonable and cost effective alternative in meeting her needs.     J4E Power Wheelchair:  This Group 2  Mid wheel drive power wheelchair is recommended and medically necessary to provide [place]  with independent mobility within  home and community environments.  This power wheelchair also has necessary electronics to support  the recommended power seating options.    Ambulatory Aids/Devices: Ambulatory aids have  been considered and ruled out due to patient's deconditioning, weakness, as Roshawn has been non-ambulatory due to recent hospitalization from small bowel obstructions.    [id number] Manual wheelchairs have been considered and ruled out due to patient's weakness in BUEs, as Conor is unable to self-propel even an optimally configured manual wheelchair due to upper extremity weakness and poor muscle endurance.      Scooter/POV:  A scooter/POV has been considered and ruled out as Cordelia requires positioning and seating not available on a scooter due to trunk instability. Carmita is unable to safely transfer on/off the scooter platform nor does Ellice have the upper extremity strength or endurance to manipulate the tiller.     Additionally, Melizza requires the following features to support with pressure relief and edema management: Power Tilt/ Power Holiday representative  Power Tilt:   This feature is recommended and medically necessary given this patient's lack of strength, ROM and muscle endurance to perform any form of pressure relief.  In addition, Tilt will help Moncia Annas. Melba: This patient is unable to stand or weight shift in any direction to clear buttocks from the wheelchair for skin health placing them at a high risk for pressure ulcers.  and With the addition of elevating the lower extremities, tilting helps to control edema as the legs will be above Maebell's heart.   Elevating seat height:   Power elevating seat height would allow Cataleah to transfer independently and to perform her MRADLs independently.   Grooming (Medicine cabinet/Bathroom sink/faucet): She is unable to lean forward and coordination deteriorates the higher she raises her arm to reach cabinets, once seat is elevated, patient able to improve her reach to access the cabinets as she is able to keep her shoulders below 90 degrees  Hygiene (Tub/shower)/Toileting (Commode/Toilet): Independent with sit to stand transfer with seat elevate, able to improve her ability to stand from a seated surface as it raises the seat height  Feeding/Meal Prep (Freezer/Fridge/Stove/Microwave/Cupboard/Pantry): She able to reach cupboard and fridge door to access food and medications throughout the day. She has to take medication to assist with freezing movements several times a day  Other Self Braun/Safety (Dresser/closet/clothes rod/elevator buttons/light switches/etc.): Able to use seat elevate to reach light switches and to adjust thermostat  Transfers:            Commode:  Current State: Needs Assistance: moderate assist to stand from her wheelchair  With Seat Elevator: Needs Assistance: guarding assist to stand by assist to stand to perform stand pivot  Vehicle:  Current State: Needs Assistance: moderate assist to stand from her wheelchair  With Seat Elevator: Needs Assistance: guarding assist to stand by assist to stand to perform stand pivot  Power elevating legrests: Thanh has dependent edema and requires elevating the legrests in conjunction with tilt to fully raise her legs above the level of her heart. She is unable to manipulate a lever for manual legrests elevation and this will allow for her to bring the footplates down to navigate tight spaces and prior to transfers.      Lashaya has demonstrated independence and safety operating the power wheelchair, and the home is accessible for this style base.      Gel Batteries: The batteries are gel sealed, and two are necessary to power the wheelchair. They are maintenance free and are safe for travel on the road or in the air. They are necessary to provide reliable use of the power wheelchair on a single charge.  Expandable Controller and Harness for Controller: This controller will allow for adaptations of switches and other needed changes to make wheelchair more accessible for [place].    Swing Away Joystick Bracket:  Oriya will utilize the joystick to drive the wheelchair. The retractable joystick mount allows the joystick to be moved to the side and back for environmental access and clearance without rotating. This makes it possible to drive up to tabletops, desks, and counters while the user is still able to access the joystick. It also can facilitate forward transfers by safely moving out to the side. The joystick can also be placed at any angle for better hand access and will retract in either direction (left or right).    Tru Comfort Backrest: Given Lafaye???s level of immobility, she requires an appropriate seat back to provide appropriate support and stability for maintaining an upright-seated position. It is required to provide [place] with improved sitting tolerance and appropriate trunk support needed due to decreased postural control. Standard planar seating systems are inadequate for appropriate postural support. This backrest, combined with provided lumbar support and lateral wedges increases safety and stability for improved function. This recommended backrest simulates the contours of the trunk and provides stability for positioning, as well as reduces the risk of developing spinal deformities. The backrest is customizable with the use of postural supports and is compatible for use with prescribed seat functions.     Full Length Two Post Armrests (Left and Right):  This armrest is recommended and medically necessary to support the patient's arms as they navigate their chair.  The flip up feature is recommended and medically necessary to allow the patient to transfer into and out of the chair.     Headrest and Hardware: This contoured adjustable angle headrest is medically necessary to provide posterior and lateral support to the cervical spine and head. This headrest is used for positioning and head control (especially necessary for use with power seat functions such as power tilt)    Wide Footplates:  This is required for placement of Trenna's feet when in the wheelchair. It has a wider width to allow for her feet to be properly placed. her feet are externally rotated and does not fit properly in the standard footplates.    Tax inspector: This feature is necessary to allow for the patient to access and utilize her power seat functions via the joystick.     Axiom SP Fluid Cushion: This is a high pressure-relieving cushion to assist with pressure relief during sustained sitting.  As mentioned prior, the patient???s lack of mobility puts her at high risk for skin breakdown as she already has a Stage 3 pressure injury from her current wheelchair with a basic foam pad as a cushion.      Consistent with Recardo Botts Pawley's presentation, my clinical examination findings and the accepted clinical practice guidelines, this fully customizable power wheelchair will: be more efficient to propel resulting, be more durable and cost-effective to operate long-term, as well as provide better postural support for balance, function and comfort than other lower cost wheelchairs. There are no other accepted treatment alternative for addressing Manju Kulkarni [facility] seating and mobility needs.      This is to certify that I, the below signed therapist, do not have an affiliation with the DME provider, the manufacturer of the recommended equipment, or with any long term Braun facility.    I attest that I have reviewed the above information.  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

## 2024-06-25 NOTE — Unmapped (Signed)
 Reason for Visit: Hospital Follow-up Medication Management    History of Present Illness:  Tammy Braun is an 83 y.o. female with a past medical history of Afib, COPD, HTN, T2DM, CKD, prior CVA, schizoaffective disorder, bladder cancer w/ ileal conduit (2012), osteoporosis, and SBO s/p small bowel resection (Feb 2025) who was recently hospitalized from 06/13/24 to 06/22/24 for CAP + segmental RLL PE s/p 5-day course of levofloxacin  + resumed Eliquis  w/ 7-day load (previously on for Afib but self-discontinued in May 2025 due to concerns for falls and bleeding risk). Pt presents to the [facility] for follow-up without all of her medication bottles. Accompanied by daughter, [name], who contributes to the history.    Pt feels okay today, though still not quite right. Feels weak and woozy. Daughter feels that diltiazem  needs to be reduced from 240mg  daily to prior dose of 120mg  daily since previous pressures may have been from undiagnosed PE. Monitors BP at home with wrist cuff, this morning was 156/63.     Having BM 1-2x/day, though daughter feels that they are often small and not complete. Taking senna 1 tab nightly and Miralax  once daily.     Some complaints of increased fluid retention. Has a scale at home, though difficult for daughter to get her on the scale as she would prefer a wider base.    Medication Adherence and Access:  Missed doses?: no  Uses pillbox?: yes - patient fills primarily, and daughter adds additional meds   Anyone else assist with medication organization? yes; daughter helps  Current insurance coverage: Medicare/Medicaid  Preferred Pharmacy:  [facility] in [place]  Medications affordable?: yes  Needs refills? no    Allergies:   Allergies[1]    Medications: Medications reviewed in EPIC medication station and updated today by the clinical pharmacist practitioner.  Current Outpatient Medications on File Prior to Visit   Medication Sig Dispense    acetaminophen  (TYLENOL ) 500 MG tablet Take 1 tablet (500 mg total) by mouth every six (6) hours. About 3-4 times per day (1 tablet, 500mg )    albuterol  HFA 90 mcg/actuation inhaler Inhale 2 puffs every four (4) hours as needed for wheezing. 2x/day    apixaban  (ELIQUIS ) 5 mg Tab Take 1 tablet (5 mg total) by mouth two (2) times a day. Taking twice daily     diclofenac  sodium (VOLTAREN ) 1 % gel Apply 2 grams topically four (4) times a day. Using, helps a lot (OTC)    dilTIAZem  (CARDIZEM  CD) 240 MG 24 hr capsule Take 1 capsule (240 mg total) by mouth daily. Previous dose of 120    fluPHENAZine  decanoate (PROLIXIN ) 25 mg/mL injection Inject 2 mL (50 mg total) into the muscle every thirty (30) days. 06/08/24 last dose    furosemide  (LASIX ) 20 MG tablet Take 1 tablet (20 mg total) by mouth daily. Taking daily    gabapentin  (NEURONTIN ) 100 MG capsule Take 1 capsule (100 mg total) by mouth Three (3) times a day as needed (pain). As needed; makes confused when taking TID, usually 1x/day    polyethylene glycol (GLYCOLAX ) 17 gram/dose powder Dissolve one capful (17 grams) in 4 to 8oz of fluid and drink by mouth daily as directed. Once daily    SENNA 8.6 mg tablet Take 2 tablets by mouth nightly. 1 tablet nightly      Current Facility-Administered Medications on File Prior to Visit   Medication Dose Route Frequency Provider Last Rate Last Admin    fluPHENAZine  decanoate (PROLIXIN ) injection  50 mg  50 mg Intramuscular Q30 Days    50 mg at 06/08/24 1153       Assessment and Plan:     # HTN - BP today of 116/70 is well controlled for goal <130/80. Daughter feels strongly about reducing diltiazem  back to prior dose of 120mg  daily. Proposed this, with addition of ARB for CKD benefits, however pt/daughter decline ARB today and would like to proceed with diltiazem  dose reduction only. Dose reduction may also decrease risk of constipation that could could have been contributing to concerns for SBO.   Reduce diltiazem  to 120mg  daily  Continue checking BP at home. If continued discrepancies between home and clinic readings, consider bringing in home cuff for validation.     # Osteoporosis -  T-score at femoral neck -2.9 and total hip -3.6 in 2023, indicative of osteoporosis. Outside notes reference alendronate ~2012 but unsure how long pt was on it. Daughter/pt do not recall. Pt/daughter are hesitant with any additional medications, especially GI side effects related to PO bisphosphonates. Given severity of osteoporosis, may also consider treatment with anabolic agent prior to bisphosphonate. At this time, pt/daughter feel comfortable with starting vitamin D.   Start cholecalciferol  1000 units daily (vitamin D 26.3 on 04/23/24)  Referral placed to endocrinology to discuss osteoporosis management    # CKD - would benefit from ACE/ARB and SGLT2 for management. Given hesitancy around medications, preference for ACE/ARB as first-line (previously on candesartan ). BMP pending today. Overdue for repeat UACR, which was ordered today though does not appear it was obtained. UACR most recently 52.6 12/15/19. Collect at future visit.     # Medication Management   Discussed potential of statin reinitiation given reported hx of CVA, though daughter declines additional medications.   Counseled on albuterol  MDI technique  Encouraged use of medication pill box and reviewed appropriate use. Reviewed the indication, dose, and frequency of each medication with patient.       Recommendations and medication-related problems were discussed directly with the patient's transitions of care physician, Dr. Isaiah, immediately following the pharmacist visit prior to the physician visit. Questions/concerns were addressed to the patient's satisfaction. I spent a total of 20 minutes face to face with the patient delivering clinical care and providing education/counseling.      Future Appointments   Date Time Provider Department Center   06/25/2024 11:20 AM Isaiah Lynwood Simpers, MD [facility] 06/25/2024 12:30 PM Leanora Reena ORN, PT [facility]   06/26/2024 11:15 AM Delores Bare, MD [facility]   06/29/2024 10:45 AM Terresa Collar, AGNP [facility]   07/06/2024 11:30 AM [facility] NURSE [facility]   07/09/2024 10:00 AM Mazzola Woodfin everitt Solon Unice, MD [facility]   07/15/2024 10:30 AM Ramm, Calton Idell ELNITA DAYTON [facility]   07/22/2024 11:40 AM Elige [name], MD [facility]   10/28/2024 12:30 PM Claudene Cesar HERO, MD [facility]     ___________________________________________________     Lauraine Pont, PharmD, BCACP, CPP  Family Medicine Pharmacist           [1]   Allergies  Allergen Reactions    Lisinopril Anaphylaxis and Swelling    Losartan  Dizziness    Hctz [Hydrochlorothiazide ]      SIADH

## 2024-06-26 DIAGNOSIS — I50812 Chronic right heart failure: Principal | ICD-10-CM

## 2024-06-26 DIAGNOSIS — Z79899 Other long term (current) drug therapy: Principal | ICD-10-CM

## 2024-06-26 DIAGNOSIS — J432 Centrilobular emphysema: Principal | ICD-10-CM

## 2024-06-26 DIAGNOSIS — F17200 Nicotine dependence, unspecified, uncomplicated: Principal | ICD-10-CM

## 2024-06-26 DIAGNOSIS — F259 Schizoaffective disorder, unspecified: Principal | ICD-10-CM

## 2024-06-26 NOTE — Unmapped (Signed)
 Follow-up instructions:  - Please continue taking your medications as prescribed for your mental health.   - Do not make changes to your medications, including taking more or less than prescribed, unless under the supervision of your physician. Be aware that some medications may make you feel worse if abruptly stopped  - Please refrain from using illicit substances, as these can affect your mood and could cause anxiety or other concerning symptoms.   - Seek further medical care for any increase in symptoms or new symptoms such as thoughts of wanting to hurt yourself or hurt others.     Contact info:  Life-threatening emergencies: Call 911, the 988 suicide and crisis lifeline, or go to the nearest ER for medical or psychiatric attention.           Issues that need urgent attention but are not life threatening: Call the clinic outpatient front desk for assistance:    - [phone] for [facility] adult psychiatry clinics located at [address] [facility]  - [phone] for [facility] child and adolescent psychiatry clinics located at [address]  - [phone] for [facility] and adult psychiatry clinics located at [address]    Non-urgent routine concerns and questions: Send a message through [facility] or call our clinic front desk.    Refill requests: Check with your pharmacy to initiate refill requests.    Regarding appointments:  - If you need to cancel your appointment, we ask that you call your clinic at least 24 hours before your scheduled appointment at the numbers listed above.  - If for any reason you arrive 15 minutes later than your scheduled appointment time, you may not be seen and your visit may be rescheduled.  - Please remember that we will not automatically reschedule missed appointments.  - If you miss two (2) appointments without letting us know in advance, you will likely be referred to a provider in your community.  - We will do our best to be on time. Sometimes an emergency will arise that might cause your clinician to be late. We will try to inform you of this when you check in for your appointment. If you wait more than 15 minutes past your appointment time without such notice, please speak with the front desk staff.    In the event of bad weather, the clinic staff will attempt to contact you, should your appointment need to be rescheduled. Additionally, you can call the Patient Weather Line [phone] for system-wide clinic status    For more information and reminders regarding clinic policies (these were provided when you were admitted to the clinic), please ask the front desk.

## 2024-06-26 NOTE — Unmapped (Signed)
 [facility] FAMILY MEDICINE [place] POPULATION HEALTH  Care Management Progress Note    Date: 06/26/2024  Outcome:  Care Management Task Complete    Purpose of contact: Care Management    Conducted chart review regarding incontinence supplies - patient's supplier is [employer] and paperwork was most recently faxed back on 6/25.  Contacted [employer] to ensure everything is on file for patient's recent resupply. Spoke with Damien and she stated that second page (Certificate of Medical Necessity) is missing. Checked media tab and confirmed that signed order from 6/25 missing the second page. They will re-fax today for physician signature.     Additional Information/Plan:  Patient provided my direct contact information and encouraged to contact me should additional needs arise.    Time Spent Per Day:  Chart review was completed prior to outreach attempt.   06/26/2024: 10      [name], MSW  [facility] FAMILY MEDICINE [place]

## 2024-06-26 NOTE — Unmapped (Signed)
 Copied from CRM #[id number]. Topic: Access To Clinicians - Req Clinic Call Back  >> Jun 26, 2024  1:12 PM Massie B wrote:  Document Related: Form/Vaccine Record: Certificate of medical Necessity The form was faxed on 6/27.    I was unable to confirm if the document has been completed; the caller requests a status update.    They are requesting the form to be faxed to: [employer]. The fax number is: [phone]      [employer] preferred contact: Cell Phone:  [phone]. Form Completion: 7-14 business days. Programmer, systems Notified)

## 2024-06-26 NOTE — Unmapped (Signed)
 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     Overall stable in terms of mood and psychotic symptoms. Discussed using the senior center more frequently to better occupy her time.     Identifying Information:  Tammy Braun is a 83 y.o. female with a history of schizoaffective disorder. First presented to STEP clinic September 2021. She has been stable on prolixin  50 mg q30 days for the past 30 years. Per PCP, patient has had worsening of depressive symptoms, though patient did not endorse. Reports auditory hallucinations and denies any CAH. Reports some anxiety related to impatience waiting on others, but finds this manageable. Patient is not interested in any new medications, but could consider increasing the frequency of prolixin  administration if needed. March 2022, augmenting with mirtazapine  for low mood and sleep. Could also consider augmenting with SRNI or wellbutrin for depression.     Risk Assessment:  A full suicide and violence risk assessment was performed as part of this patient's initial evaluation with [facility] outpatient psychiatry.  There is no new acute risk for suicide or violence at this time. The patient was educated about relevant modifiable risk factors including following recommendations for treatment of psychiatric illness and abstaining from substance abuse.   While future psychiatric events cannot be accurately predicted, the patient does not currently require acute inpatient psychiatric care and does not currently meet [place]  involuntary commitment criteria.      Plan:    Problem: Schizoaffective disorder  Status of problem: chronic and stable  Interventions:   - Continue Prolixin  50 mg IM q30 days    # TUD  - quit without NRT in April 2025.    #High Risk Medication Monitoring   Lab Results   Component Value Date    A1C 5.6 04/23/2024      Lab Results   Component Value Date    LDL 75 04/23/2024    LDL 88 11/02/2022    LDL 107 (H) 11/03/2021       BP Readings from Last 3 Encounters:   06/26/24 163/74   06/25/24 116/70   06/22/24 150/86       Psychotherapy provided:  No billable psychotherapy service provided.    Patient has been given this writer's contact information as well as the [facility] Psychiatry urgent line number. The patient has been instructed to call 911 for emergencies.    Patient was seen and plan of care was discussed with the Attending MD,Dr. Glenora, who agrees with the above statement and plan.    Subjective:    Denies mood/psychosis issues other than infrequent AH. Brought on by stress usually.    She quit smoking which we congratulated her on. Will try and get her LAI injections lined up with future appts.     Objective:    Mental Status Exam:  Appearance:    Appears stated age   Motor:   No abnormal movements   Speech/Language:    Normal rate, volume, tone, fluency and Impaired articulation   Mood:   Good   Affect:   Euthymic   Thought process and Associations:   Logical, linear, clear, coherent, goal directed   Abnormal/psychotic thought content:     Denies SI, HI, self harm, delusions, obsessions, paranoid ideation, or ideas of reference   Perceptual disturbances:     Endorses infrequent auditory hallucinations. Most  oftenunder stress.     Visit was completed face to face.    I personally spent 30 minutes face-to-face and non-face-to-face in the care of this patient, which includes all pre, intra, and post visit time on the date of service.  All documented time was specific to the E/M visit and does not include any procedures that may have been performed.       Zachary Daring, MD

## 2024-06-28 NOTE — Unmapped (Signed)
 I saw and evaluated the patient, participating in the key portions of the service.  I reviewed the resident???s note.  I agree with the resident???s findings and plan. Augusto Garbe, MD

## 2024-07-01 MED FILL — FLUPHENAZINE DECANOATE 25 MG/ML INJECTION SOLUTION: INTRAMUSCULAR | 30 days supply | Qty: 5 | Fill #5

## 2024-07-06 DIAGNOSIS — F250 Schizoaffective disorder, bipolar type: Principal | ICD-10-CM

## 2024-07-06 MED ADMIN — fluPHENAZine decanoate (PROLIXIN) injection 50 mg: 50 mg | INTRAMUSCULAR | @ 16:00:00

## 2024-07-06 NOTE — Unmapped (Signed)
 Prolixin  dec 200 mg administered via intramuscular injection to patient's right deltoid . Patient tolerated injection well. Next injection scheduled for 4 weeks.

## 2024-07-06 NOTE — Unmapped (Signed)
 You have a fax in your box requiring your signature.

## 2024-07-07 NOTE — Unmapped (Signed)
 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 by Jon Sharps MD, [facility]    Recent relevant ED visits/Admissions/appts   12/25-12/31/2024 @ [facility]  for sepsis d/t UTI &CAP  2/26-4/15/25 for SBO, prolonged admission  6/14-6/23/25 admit for multifocal pneaumonia  06/25/24 PCP/transistions of care appt-->health management; hospital follow-up appt    Wt=61.3 kg, ht=152.4 cm, bmi=26.39 (06/25/24)  Hgba1c=5.6 (04/23/2024)  +tobacco    PMH: tobacco use disorder, CKD-stage 2, COPD, right-sided heart failure, pulmonary HTN,  h/o PE, a fib, peripheral neuropathy, T2DM, h/o CVA, schizoaffective disorder, h/o bladder cancer, osteoporosis, HLD, tricuspid valve regurgitation, h/o bladder cancer    PSH  S/p xlap, reduction of parastomal hernia, SBR, LOA and abthera placement (02/28/24, Lammers @ [facility])  S/p reopen recent laparotomy and abdominal closure (03/01/2024, Johnson & Feuer @ [facility])  S/p reopening recent laparotomy, abdominal closure and prevana wound vac placement (03/03/2024, Raff @ [facility])    Other surgery  S/p left shoulder surgery (04/08/2018)  S/p cataract surgeries (06/05/2022 & 06/19/2022)    Imaging  06/13/24 CTA chest-->  Impression  1. Subacute or chronic occlusive segmental right lower lobe pulmonary embolus. Poor opacification of distal pulmonary artery branches in the left lower lobe likely reflects poor right heart output, though additional small distal emboli are not excluded.  2. Dilated right atrium. Enlarged central pulmonary arteries, reflecting known pulmonary hypertension.  3. Upper lung-predominant emphysema. Multifocal pulmonary infectious opacities, more extensive in the left lung.  4. Large posterior diaphragmatic hernia.    06/18/24 CT  Impression  -Redemonstrated large type IV hiatal hernia containing stomach, bowel loops, pancreas and significant amount of intra-abdominal fat/mesentery. No evidence of gastric obstruction or colonic obstruction.  - Multiple mildly dilated loops of small bowel with no definitive abrupt transition point visualized. However there is a parastomal hernia containing small bowel loop which appears dilated without evidence of obstruction at the level of small bowel anastomosis. The findings may overall represent ileus versus early/partial small bowel obstruction. Correlation with radiographs close be considered to ensure the passage of contrast to the colonic loops.  - Right lower quadrant ileal conduit with mild to moderate bilateral hydroureteronephrosis. Ileal conduit appears distended and there may be obstruction at the level of ileal conduit entrance to the ostomy along the anterior abdominal wall.  - Mild wall thickening of the rectum, which is nonspecific and could also be secondary to underdistention.  - Trace right pleural effusion. Right lung base atelectasis.    Other tests/procedures  06/2020 zio patch-->  Conclusions:   - Ambulatory ECG monitoring was performed from 6/30 to 07/13/20 .   -  The predominant rhythm was sinus rhythm, with the rate ranging from 47  to 101 and averaging 78 bpm when in sinus rhythm   -  Rare supraventricular ectopics (PACs) were recorded with 31 episodes of SVT (Average rate 117, longest ~30s) likely atach from the recordings available for review   -  Frequent ventricular ectopics (PVCs) were recorded with 19% burden of monomorphic PVCs. No  Wide complex tachycardia events recorded. Bigeminy was present.   -  138 patient triggered events and 14 diary events were recorded, all associated with PVCs and PACs but not SVT.   -  No pauses >3 seconds or high-grade AV block detected   -  No atrial fibrillation detected    03/11/24 cardiac echo-->  Summary    1. The left ventricular systolic function is hyperdynamic, LVEF is visually  estimated at 70%.    2. The left atrium is  dilated in size.    3. The right ventricle is moderately dilated in size, with moderately  reduced systolic function.    4. There is severe pulmonary hypertension.    5. TR maximum velocity: 4.6 m/s Estimated PASP: 96 mmHg.    6. The right atrium is dilated in size.    05/27/24 PFTs  No evidence of obstruction. Reduced FVC may indicate gas trapping or restriction. Recommend formal lung volumes. Normal inspiratory flow  curve.    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 The patient had an open abdomen The risks, benefits and alternatives to surgery were discussed with the patient and family, and informed consent was obtained.     Description of the Procedure: The patient was identified in the preoperative holding area and taken to the operative suite where they were placed on the table in the supine position. A pre-induction timeout was conducted with all team members present. General endotracheal anesthesia was administered and the patient was prepped and draped in the usual fashion. A pre-incisional timeout was then conducted and preoperative medications and equipment availability confirmed.     We performed a midline laparotomy down to the level of the fascia. We were able to enter the abdomen in the superior portion of the abdomen in a native plane. There was noted to be dense adhesions throughout the abdomen. During entry near her ileal conduit site and enterotomy was made due to the dense adhesions resulting in the spillage of succus. This was suctioned free and the enterotomy was controlled using a 3-0 vicryl suture. The remained of themidline was opened without issue. Patient was noted to have dense adhesions throughout her mid abdomen and pelvis with grossly dilated loops of bowel. Upon freeing up themajority of the adhesions, which took roughly 90 minutes, the transition point was noted to be secondary to thick adhesive disease from the midline to the ostomy site with a contained parastomal hernial. These adhesions were lysed sharply and the hernia was relieved. The remainder of the pelvis was carely dissected to free her bowel. 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 This was relieved sharply in order to assure there were no secondary transition points resulting in a 1 cm defect. The edges were sharply cleaned back to healthy bowel and this was repaired in 2 layers using 3-0 silk sutures. Once all the bowel was free we ran the bowel from the ligatament of treitz to the cecum and assured that there were no transition points left. Due to the extend of the enterotomy that was temporarily controlled, we elected to resect this region of bowel measuring roughly 5cm and perform  a side by side functional end to end antiperistaltic anastamosis using a 80mm blue load GIA stapler. The common channel was closed using a 80mm blue load GIA stapler. The staple line was oversewn and the anastamosis was healthy appearing and widely patient. The ileal conduit was assessed and noted to be viable. At this point due to concerns for multiple serosal injuries, need for a secondary assessment of the ileal conduit due to previous concerns of it being dusky, and need for secondary assessment of the primary repaired portion of the bowel we elected to leave her open for a second look to assure the bowel was all healthy prior to closure in this chronically ill patient. The abdomen was copiously irrigated and hemostasis was achieved. We placed an ABThera wound vac in the usual fashion on an incision that was greater than 50 square cm. It was connected to durable, non-disposable medical device (wound vac).       The patient remained intubated and was taken back to the ICU in stable condition.   Sponge and instrument counts were correct at the end of the case.     S/p reopen recent laparotomy and abthera placement (03/01/2024, Vicci & Feuer @ [facility])    Findings: Diffusely dilated small bowel. Urostomy intact with negative leak test by Urology team. Oxygen desaturations with re-approximation of the fascia.     Operative details: The patient was identified in the SICU. The risks, benefits, and alternatives to the procedure were discussed and informed consent was obtained from the patient's family. The patient was transported to the OR and placed in supine position. An initial pre-operative timeout was performed verifying the patient, procedure, and surgical site. Anesthesia was induced. The abdomen was prepped and draped in standard sterile fashion. A second surgical timeout was performed.     We started by removing the Abthera wound vacuum. Adhesions were appreciated throughout and bluntly divided. We ran the small bowel from LOT to urostomy. The anastomosis appeared intact. Between the urostomy and small bowel anastomosis there was an area concerning for serosal injury that was too long to oversew; approximately 15cm. There was not any intra-abdominal contamination. The abdomen was copiously irrigated with sterile saline. Urology scrubbed in to interrogate the urostomy. There was no evidence of leak at the urostomy. Kochers were placed on the fascia and re-approximated to midline. Re-approximation was made challenging by degree of small bowel dilation and patient oxygen desaturations. Given patient's intermittent hypoxia and ongoing tachycardia the decision was made to replace the Abthera wound vacuum and transfer back to the ICU for ongoing diuresis and pulmonary optimization. The Abthera was placed in standard fashion and connected to a wound vacuum and noted to be holding suction. Urostomy appliance was replaced.     The patient tolerated the procedure well and was transferred back to the SICU intubted and in critical condition. All counts were correct.     Dr. Vicci was present for the entirety of the procedure.    Feuer's note:    OperativeFindings:    Viable stoma without leak  Catheter placed in stoma, draining well, 5cc in balloon     Description:     Urology entered the case following general surgery Abthera removal and bowel inspection, please see their note for additional detail. We then inspected the ileal conduit which was well appearing, stoma was pink/patent. A catheter was placed into the stoma and leak test performed, which was negative. The catheter was left in the stoma with balloon inflated (  5cc) below the level of the fascia. The balloon was palpated within the conduit and noted that no pressure was being applied from the balloon to the conduit wall.  We then turned care of the patient back to general surgery, please see their note   Post Op Plan:    - Continue foley per ostomy    S/p reopening recent laparotomy, abdominal closure and prevana wound vac placement (03/03/2024, Raff @ [facility])    Findings: Diffusely dilated SB, milked contents back to decompress. Diffuse edema, ascites. Mobilized fascia bilaterally. Closed primarily in the midline. Peak pressures did increase to 27 and desatted requiring increased FiO2 but able to wean down. 19 blake drain in abdomen/pelvis, 15 french blake drain on top of fascia.      Description of procedure:  Patient brought to the OR. Timeout performed. Anesthesia induced. Abdomen prepped and draped in usual sterile fashion. Abthera drape removed. Abdomen explored. The small bowel was run and was normal. The small bowel was diffusely dilated. Bowel contents were milked back towards LOT to decompress.  We then identified the midline fascia bilaterally. We created subcutaneous flaps on top of the fascia bilaterally, taking care to avoid the stoma in the right abdomen. We then reapproximated the fascia and it reached without significant tension. We then placed a 19 french blake drain into the abdomen/pelvis and brought it out the left abdomen. IT was secured with a drain stitch. We then closed the midline fascia using interrupted 0-PDS figure of eight sutures. Once the abdomen was closed, peak pressures on the vent increased from 22 to 27. The patient did desaturate during closure but responded to increased FiO2; this was then weaned back down during the remainder of the case. A 15 french blake drain was placed on top of the fascia and brought out the left abdomen and secured with a nylon drain stitch. The skin was then closed with staples and provena wound vac applied.      Lap and instrument counts correct. X-ray revealed no retained foreign body. Wanding negative. Patient was transferred back to SICU at the end of the case.

## 2024-07-08 NOTE — Unmapped (Signed)
 [facility] [place] [facility]  Care Management Progress Note    Date: 07/08/2024  Outcome:  Phone outreach completed    Is this patient in [place] ? Yes    Purpose of contact:           Patient's daughter contacted CM regarding care coordination needs. States has been working with [facility] - [phone]. Stated her other social worker Aleck was involved, order should be faxed to [facility] and signed - pulse oximeter. Unable to locate fax in chart.     States they have not received commode. Emailed [facility] to follow up. Per Lucie, order was received and signed for already. CM to follow up with patient's daughter to inquire further.     Contacted [facility] and spoke with customer service representative, Precious. They only just faxed it today and sent it to [phone]. Will wait 1-2 days to receive and will call back if not able to locate fax from [facility]. Confirmed again that this will be covered and per representative, medicare plan will pay for it.     Per chart - Aleck from [facility] [place], preferred contact: [phone].    Additional Information/Plan:  Patient provided my direct contact information and encouraged to contact me should additional needs arise.    [name], MSW  Population Health  [facility] [place]

## 2024-07-09 ENCOUNTER — Ambulatory Visit: Admit: 2024-07-09 | Discharge: 2024-07-10 | Payer: MEDICARE

## 2024-07-09 DIAGNOSIS — K435 Parastomal hernia without obstruction or  gangrene: Principal | ICD-10-CM

## 2024-07-09 NOTE — Unmapped (Signed)
 New Patient Consultation    Referral Diagnosis: Parastomal hernia   Referring Provider: [facility] urology    Tammy Jon Edelman, MD  [address]  Surgery  [id number]  [place],  [place] [address]   Referring Documents:  Referral note: in Epic chart    Op notes: in Epic chart    Imaging: CT scan from 6/19 in Epic chart imaging        NEW PATIENT HISTORY AND PHYSICAL   07/09/2024      Assessment and Plan:    83 year old female with past medical history significant for CKD stage II, COPD, right-sided heart failure, pulmonary hypertension, history of recent PE on anticoagulation with Eliquis , A-fib, peripheral neuropathy, type 2 diabetes, history of previous stroke, schizoaffective disorder, bladder cancer status post ileal conduit creation in 2012 and past surgical history significant for exploratory laparotomy, reduction of parastomal hernia, small bowel resection, lysis of adhesion and 2 subsequent laparotomies to achieve abdominal closure in February/March 2025, left shoulder surgery in 2019, cataract surgeries in 2023 presenting to clinic today for evaluation for her parastomal hernia.    At this point in time, urostomy site is functioning appropriately.  Patient is able to tolerate p.o. intake without any issue.  Good bowel function.  Functional status severely limited from her recent hospital stay.    Plan:  Return to clinic in 6 months to discuss surgical options for parastomal hernia repair  No acute surgical intervention indicated at this time  Will allow patient to heal from recent laparotomies in the time being  Patient knows that she can call us  with any concerns or questions      CC: Parastomal hernia    HPI: 83 year old female with past medical history significant for CKD stage II, COPD, right-sided heart failure, pulmonary hypertension, history of recent PE on anticoagulation with Eliquis , A-fib, peripheral neuropathy, type 2 diabetes, history of previous stroke, schizoaffective disorder, bladder cancer status post ileal conduit creation in 2012 and past surgical history significant for exploratory laparotomy, reduction of parastomal hernia, small bowel resection, lysis of adhesion and 2 subsequent laparotomies to achieve abdominal closure in February/March 2025, left shoulder surgery in 2019, cataract surgeries in 2023 presenting to clinic today for evaluation for her parastomal hernia.    Her functional status remains very limited, as she was not able to walk anymore since her recent hospitalization.  Wheelchair-bound.  Otherwise, she has been able to tolerate p.o. intake appropriately without any nausea or vomiting.  Denies having any recent fevers, chills, nausea, vomiting, diarrhea, constipation, or chest pains.    Of note, patient states that she would like to avoid having another surgery if she does not need one.  However, she does understand that this parastomal hernia of hers can lead to future hospitalizations and operations if not repaired.    She has a BMI of 26.37 and PMH of CKD, COPD, right-sided heart failure, pulmonary hypertension, pulmonary embolus, stroke, diabetes, peripheral neuropathy.  Her last HgbA1c was 5.6.    She denies overlying skin changes.  She denies obstructive symptoms.      She has surgical history consisting of:  S/p xlap, reduction of parastomal hernia, SBR, LOA and abthera placement (02/28/24, [facility] @ [facility])  S/p reopen recent laparotomy and abdominal closure (03/01/2024, [facility] @ [facility])  S/p reopening recent laparotomy, abdominal closure and prevana wound vac placement (03/03/2024, [facility] @ [facility])    She does use cigarettes    She has a behavioral  health history of schizo affective disorder    She uses a wheelchair.  She canNOT walk the distance of a [employer] parking lot without stopping (<4 METs).  She participates in the following sports activity: none.    Her normal employment consists of the following activity: No Employment.  She lives with their daughter.    Notable [facility] comorbidities for hernia repair include: history of prior surgery at hernia site  Notable [facility] history for ventral hernias: history of open abdomen      ROS Patient is here today with: By herself  History obtained from the patient   12 point review of systems is otherwise negative except for the pertinent positives and negatives as per HPI.    General ROS: negative  Ophthalmic ROS: negative  ENT ROS: negative  Hematological and Lymphatic ROS: negative  Respiratory ROS: no cough, shortness of breath, or wheezing  Cardiovascular ROS: no chest pain or dyspnea on exertion  Gastrointestinal ROS: hernia  Genito-Urinary ROS: no dysuria, trouble voiding, or hematuria  Musculoskeletal ROS: negative  Neurological ROS: no TIA or stroke symptoms  Dermatological ROS: negative       Medications Current Rx[1]   Allergies Allergies[2]   Medical History Past Medical History[3]     Surgical History As above in HPI   Family History Noncontributory   Social History Tobacco use:   reports that she has been smoking cigarettes. She has never used smokeless tobacco.  Alcohol use:   reports no history of alcohol use.  Drug use:  reports no history of drug use.  Living situation: per HPI  Employment: per HPI       Vitals: Blood pressure 136/109, pulse 79, temperature 36.2 ??C (97.1 ??F), height 152.4 cm (5'), weight 61.2 kg (135 lb), not currently breastfeeding.   BMI Estimated body mass index is 26.37 kg/m?? as calculated from the following:    Height as of this encounter: 152.4 cm (5').    Weight as of this encounter: 61.2 kg (135 lb).   Physical Exam BP 136/109  - Pulse 79  - Temp 36.2 ??C (97.1 ??F)  - Ht 152.4 cm (5')  - Wt 61.2 kg (135 lb)  - BMI 26.37 kg/m??   General appearance: alert but appears frail; resting in wheelchair in clinic today  Lungs: clear to auscultation bilaterally  Abdomen: soft, non-tender; bowel sounds normal; right lower quadrant urostomy site present with urine in her bag; midline laparotomy site healing appropriately with some areas of granulation tissue present; however no purulent discharge noted on exam today  Extremities: extremities normal, atraumatic  Pulses: 2+ and symmetric.       Imaging: Recent CT scan reviewed.  Large type IV hiatal hernia present.  Loop of small bowel noted within the parastomal hernia.             [1]   Current Outpatient Medications   Medication Sig Dispense Refill    acetaminophen  (TYLENOL ) 500 MG tablet Take 1 tablet (500 mg total) by mouth every six (6) hours. 30 tablet 0    albuterol  HFA 90 mcg/actuation inhaler Inhale 2 puffs every four (4) hours as needed for wheezing. 18 g 11    apixaban  (ELIQUIS ) 5 mg Tab Take 1 tablet (5 mg total) by mouth two (2) times a day. 180 tablet 1    blood sugar diagnostic (GLUCOSE BLOOD) Strp Disp test strips preferred by insurance plan. Testing qday, Dx: E11.9 (Type 2 DM- controlled) 30 strip 11    blood-glucose  meter kit Disp. blood glucose meter kit preferred by patient's insurance. Check blood sugars as directed by provider. Dx: Diabetes, E11.9 1 each 1    diclofenac  sodium (VOLTAREN ) 1 % gel Apply 2 grams topically four (4) times a day. 100 g 1    dilTIAZem  (CARDIZEM  CD) 120 MG 24 hr capsule Take 1 capsule (120 mg total) by mouth daily. 100 capsule 3    fluPHENAZine  decanoate (PROLIXIN ) 25 mg/mL injection Inject 2 mL (50 mg total) into the muscle every thirty (30) days. 5 mL 11    furosemide  (LASIX ) 20 MG tablet Take 1 tablet (20 mg total) by mouth daily. 30 tablet 5    gabapentin  (NEURONTIN ) 100 MG capsule Take 1 capsule (100 mg total) by mouth Three (3) times a day as needed (pain). 90 capsule 0    inhalational spacing device (E-Z SPACER) Spcr 1 each by Miscellaneous route every four (4) hours as needed. 1 each 0    lancets Misc Disp. lancets #30 or amount allowed, Testing once Qday. Dx: E11.9 (Diabetes- controlled) 30 each 11    MEDICAL SUPPLY ITEM Entreal formula nutritionally supplement complete caloric density 3 application 3    MEDICAL SUPPLY ITEM Compression stockings knee high 2 each 0    miscellaneous medical supply Misc 1 each by Miscellaneous route four (4) times a day. 100 each 0    polyethylene glycol (GLYCOLAX ) 17 gram/dose powder Dissolve one capful (17 grams) in 4 to 8oz of fluid and drink by mouth daily as directed. 510 g 2    SENNA 8.6 mg tablet Take 2 tablets by mouth nightly. 60 tablet 2    underpads 2.6 X 2.9 feet Pads 1 each by Miscellaneous route two (2) times a day. 100 each 3     Current Facility-Administered Medications   Medication Dose Route Frequency Provider Last Rate Last Admin    acetaminophen  (TYLENOL ) solution 325 mg  325 mg Oral Once Isaiah Lynwood Simpers, MD        fluPHENAZine  decanoate (PROLIXIN ) injection 50 mg  50 mg Intramuscular Q30 Days    50 mg at 07/06/24 1213   [2]   Allergies  Allergen Reactions    Lisinopril Anaphylaxis and Swelling    Losartan  Dizziness     Other Reaction(s): Dizziness    Hctz [Hydrochlorothiazide ]      SIADH   [3]   Past Medical History:  Diagnosis Date    Anxiety     Arthritis     At risk for falls     Breast cyst     Cancer        bladder    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

## 2024-07-09 NOTE — Unmapped (Signed)
There is a form in your box requiring your signature.

## 2024-07-10 NOTE — Unmapped (Signed)
Form faxed to [phone]

## 2024-07-15 ENCOUNTER — Encounter: Admit: 2024-07-15 | Discharge: 2024-07-16 | Payer: MEDICARE | Attending: Adult Health | Primary: Adult Health

## 2024-07-15 ENCOUNTER — Ambulatory Visit: Admit: 2024-07-15 | Discharge: 2024-07-16 | Payer: MEDICARE | Attending: Adult Health | Primary: Adult Health

## 2024-07-15 DIAGNOSIS — I2782 Chronic pulmonary embolism: Principal | ICD-10-CM

## 2024-07-15 DIAGNOSIS — I493 Ventricular premature depolarization: Principal | ICD-10-CM

## 2024-07-15 DIAGNOSIS — I5032 Chronic diastolic (congestive) heart failure: Principal | ICD-10-CM

## 2024-07-15 DIAGNOSIS — I10 Essential (primary) hypertension: Principal | ICD-10-CM

## 2024-07-15 DIAGNOSIS — F17200 Nicotine dependence, unspecified, uncomplicated: Principal | ICD-10-CM

## 2024-07-15 DIAGNOSIS — I480 Paroxysmal atrial fibrillation: Principal | ICD-10-CM

## 2024-07-15 DIAGNOSIS — I361 Nonrheumatic tricuspid (valve) insufficiency: Principal | ICD-10-CM

## 2024-07-15 LAB — CBC W/ AUTO DIFF
BASOPHILS ABSOLUTE COUNT: 0 10*9/L (ref 0.0–0.1)
BASOPHILS RELATIVE PERCENT: 0.5 %
EOSINOPHILS ABSOLUTE COUNT: 0.1 10*9/L (ref 0.0–0.5)
EOSINOPHILS RELATIVE PERCENT: 1.7 %
HEMATOCRIT: 39.4 % (ref 34.0–44.0)
HEMOGLOBIN: 12.9 g/dL (ref 11.3–14.9)
LYMPHOCYTES ABSOLUTE COUNT: 1.2 10*9/L (ref 1.1–3.6)
LYMPHOCYTES RELATIVE PERCENT: 14 %
MEAN CORPUSCULAR HEMOGLOBIN CONC: 32.7 g/dL (ref 32.0–36.0)
MEAN CORPUSCULAR HEMOGLOBIN: 27.2 pg (ref 25.9–32.4)
MEAN CORPUSCULAR VOLUME: 83.1 fL (ref 77.6–95.7)
MEAN PLATELET VOLUME: 8.3 fL (ref 6.8–10.7)
MONOCYTES ABSOLUTE COUNT: 0.6 10*9/L (ref 0.3–0.8)
MONOCYTES RELATIVE PERCENT: 6.9 %
NEUTROPHILS ABSOLUTE COUNT: 6.4 10*9/L (ref 1.8–7.8)
NEUTROPHILS RELATIVE PERCENT: 76.9 %
PLATELET COUNT: 227 10*9/L (ref 150–450)
RED BLOOD CELL COUNT: 4.74 10*12/L (ref 3.95–5.13)
RED CELL DISTRIBUTION WIDTH: 20.3 % — ABNORMAL HIGH (ref 12.2–15.2)
WBC ADJUSTED: 8.3 10*9/L (ref 3.6–11.2)

## 2024-07-15 LAB — SLIDE REVIEW

## 2024-07-15 LAB — BASIC METABOLIC PANEL
ANION GAP: 11 mmol/L (ref 5–14)
BLOOD UREA NITROGEN: 14 mg/dL (ref 9–23)
BUN / CREAT RATIO: 19
CALCIUM: 9.7 mg/dL (ref 8.7–10.4)
CHLORIDE: 102 mmol/L (ref 98–107)
CO2: 25.5 mmol/L (ref 20.0–31.0)
CREATININE: 0.75 mg/dL (ref 0.55–1.02)
EGFR CKD-EPI (2021) FEMALE: 80 mL/min/1.73m2 (ref >=60)
GLUCOSE RANDOM: 186 mg/dL — ABNORMAL HIGH (ref 70–179)
POTASSIUM: 4.1 mmol/L (ref 3.4–4.8)
SODIUM: 138 mmol/L (ref 135–145)

## 2024-07-15 NOTE — Unmapped (Signed)
 DIVISION OF CARDIOLOGY   [employer] , Genetta Potters        Date of Service: 07/15/2024    Assessment/Plan     1. PVCs -ziopatch in 2021 showed PVC burden 19%. Nuclear stress test and echo overall normal. Daughter thought this was due to use of Goody powders. EKG today w/ frequent PVCs which has been noted on recent EKGs. No associated  symptoms.  Recent echo w/ normal LVEF.    - continue diltiazem  120 mg daily  - plan on echo surveillance  - may consider repeat monitor  - check BMP, CBC    2. Hypertension - normotensive today. On diltiazem  only     3. Tobacco use disorder - congratulated on quitting this past year!     4. Tricuspid Regurgitation - has been moderate to severe on prior echos. Currently asymptomatic without evidence of volume overload. We will continue to monitor.  - Echo from 02/2024 shows normal LV function, moderate RV dilation, moderately reduced RV function, severe pulmonary hypertension    5. CAD - Chest CT from 06/06/23 notes heavy calcified atherosclerotic disease within the coronary arterial distribution. Her nuclear stress test from 2021 showed no evidence of ischemia. She currently is asymptomatic, denying chest pain. LDL of 88 on 11/02/22. I urged her to quit smoking, as this will continue to worsen her coronary disease.   - ASA 81 mg and atorvastatin  40 mg.    Lab Results   Component Value Date    LDL 75 04/23/2024     6. Paroxysmal atrial fibrillation  Occurring during admission this spring for SBO.  CHADS2VASC score 6 (age-33, sex, HTN, CAD, HF). In NSR on EKG today  - continue eliquis  5 mg bid  - continue diltiazem  120 mg daily    7. HFpEF  Euvolemic to dry on exam today.   - check BMP, CBC  - may need to cut back lasix  20 mg daily if shows dehydration    8. Hx chronic PE  Recently found to have chronic segmental RLL PE  and restarted on eliquis . No chest pain or dyspnea.     Return to clinic:    Return in about 6 months (around 01/15/2025). W/ Dr. Barnet      Subjective: ERE:Hnoiduzpw, [name], MD     Chief complaint:cardiology f/up    History of Present Illness:  Tayelor Osborne is a 83 y.o. female with a history of bladder cancer s/p bladder removal w/ ileal-conduit with urostomy, emphysema, hypertension, HFpEF,  PVCs, atrial fibrillation, chronic PE, tobacco abuse and hiatal hernia who presents for routine f/up.      Ms. Emami established care after hospitalization at [facility]. She was admitted for significant hyponatremia in the setting of hydrochlorothiazide  and poor p.o. intake. Was noted to have very frequent ectopy. Nuclear stress test negative for ischemia. Echo w/ normal LVSF, severe PH, mod-sev TR. Ziopatch results during hospital stay showed 19% PVCs that were symptomatic (she felt dizzy and lightheaded during that time). She had recurrence of episodes of lightheadedness/dizziness and EKG 09/21/20 showed recurrence of PVCs in bigeminy. Her daughter felt that it was due to patient taking Goody powders again for HA and smoking. She states that she took these away from her after hospitalization and thinks that made the PVCs initially improve.   Last seen by Dr. Barnet  11/2023 and was doing well.     Admitted 02/26/24- 04/14/24 for SBO  Then admitted  6/14-6/23/25 for PNA. Found to  have chronic segmental RLL PE and restarted on eliquis .     Ms. Caratachea returns for follow-up today in clinic. Accompanied by grandson and daughter.   States she woke up feeling weak today. Whole body feels weak. Feels intermittently lightheaded which is not new. Eating and drinking normally. Drinks about 24 oz water  daily  No melena or BRBPR  Has b/l neuropathy.   Usually has some swelling but resolved - daughter thinks she may be dehydrated. TAking lasix  20 mg daily. Wt stable per records. No chest pain, Sob, swelling.   She has quit smoking!  No longer using Goody's but daughter thinks she might still obtain them without her knowledge. Doesn't feel PVCs    Past Medical History  Patient Active Problem List   Diagnosis    Osteoarthrosis neck    Senile nuclear sclerosis    DM (diabetes mellitus), type 2 with neurological complications       Hypertension    Tobacco use disorder    Hernia, diaphragmatic    Hyponatremia    Hip arthritis mild    Chronic renal disease, stage 2, mildly decreased glomerular filtration rate (GFR) between 60-89 mL/min/1.73 square meter    Peripheral neuropathy    Hypercholesterolemia refuses meds    COPD (chronic obstructive pulmonary disease) with emphysema       Schizoaffective disorder       Onychomycosis of multiple toenails with type 2 diabetes mellitus and peripheral neuropathy       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    At maximum risk for fall    Right hip pain    Tinea versicolor    Disorder of left rotator cuff    Spinal stenosis of cervical region    Left shoulder pain    Articular cartilage disorder of shoulder region, left    Tinnitus    Bradycardia    Skin breakdown    Influenza vaccination declined    Chronic tension-type headache, not intractable    Osteoarthritis of cervical spine    Arthralgia of both ankles    Frequent PVCs    Carotid arterial disease (CMS-HCC) < 40% bilaterally 2021    Primary hypertension    Nonrheumatic tricuspid valve regurgitation    Pulmonary hypertension       Pyelitis    Age-related osteoporosis without current pathological fracture    Other artificial openings of urinary tract status       Arthritis of neck    History of bladder cancer    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    Antalgic gait    Atrial fibrillation, unspecified type       History of small bowel obstruction    Urinary incontinence    Vitamin D deficiency    S/P exploratory laparotomy    Small bowel obstruction due to adhesions    Delayed surgical wound healing    Multifocal pneumonia    Subacute pulmonary embolism       History of MDR Pseudomonas aeruginosa infection    Chronic right-sided heart failure       Acute on chronic diastolic CHF (congestive heart failure)       Acute renal failure superimposed on stage 3a chronic kidney disease (CMS-HCC)    CAD (coronary artery disease)    NSTEMI (non-ST elevated myocardial infarction)       UTI (urinary tract infection)    New onset atrial fibrillation       Type II diabetes mellitus with renal manifestations  CAP (community acquired pneumonia)    Pulmonary hypertension, unspecified       Kidney lesion    Sepsis with acute organ dysfunction          Medications:  Current Outpatient Medications   Medication Sig Dispense Refill    albuterol  HFA 90 mcg/actuation inhaler Inhale 2 puffs every four (4) hours as needed for wheezing. 18 g 11    apixaban  (ELIQUIS ) 5 mg Tab Take 1 tablet (5 mg total) by mouth two (2) times a day. 180 tablet 1    blood-glucose meter kit Disp. blood glucose meter kit preferred by patient's insurance. Check blood sugars as directed by provider. Dx: Diabetes, E11.9 1 each 1    diclofenac  sodium (VOLTAREN ) 1 % gel Apply 2 grams topically four (4) times a day. 100 g 1    dilTIAZem  (CARDIZEM  CD) 120 MG 24 hr capsule Take 1 capsule (120 mg total) by mouth daily. 100 capsule 3    fluPHENAZine  decanoate (PROLIXIN ) 25 mg/mL injection Inject 2 mL (50 mg total) into the muscle every thirty (30) days. 5 mL 11    furosemide  (LASIX ) 20 MG tablet Take 1 tablet (20 mg total) by mouth daily. 30 tablet 5    gabapentin  (NEURONTIN ) 100 MG capsule Take 1 capsule (100 mg total) by mouth Three (3) times a day as needed (pain). 90 capsule 0    inhalational spacing device (E-Z SPACER) Spcr 1 each by Miscellaneous route every four (4) hours as needed. 1 each 0    lancets Misc Disp. lancets #30 or amount allowed, Testing once Qday. Dx: E11.9 (Diabetes- controlled) 30 each 11    MEDICAL SUPPLY ITEM Entreal formula nutritionally supplement complete caloric density 3 application 3    MEDICAL SUPPLY ITEM Compression stockings knee high 2 each 0    miscellaneous medical supply Misc 1 each by Miscellaneous route four (4) times a day. 100 each 0    SENNA 8.6 mg tablet Take 2 tablets by mouth nightly. 60 tablet 2    acetaminophen  (TYLENOL ) 500 MG tablet Take 1 tablet (500 mg total) by mouth every six (6) hours. 30 tablet 0    blood sugar diagnostic (GLUCOSE BLOOD) Strp Disp test strips preferred by insurance plan. Testing qday, Dx: E11.9 (Type 2 DM- controlled) 30 strip 11    polyethylene glycol (GLYCOLAX ) 17 gram/dose powder Dissolve one capful (17 grams) in 4 to 8oz of fluid and drink by mouth daily as directed. 510 g 2    underpads 2.6 X 2.9 feet Pads 1 each by Miscellaneous route two (2) times a day. 100 each 3     Current Facility-Administered Medications   Medication Dose Route Frequency Provider Last Rate Last Admin    acetaminophen  (TYLENOL ) solution 325 mg  325 mg Oral Once Isaiah Lynwood Simpers, MD        fluPHENAZine  decanoate (PROLIXIN ) injection 50 mg  50 mg Intramuscular Q30 Days    50 mg at 07/06/24 1213       Allergies  Allergies   Allergen Reactions    Lisinopril Anaphylaxis and Swelling    Losartan  Dizziness     Other Reaction(s): Dizziness    Hctz [Hydrochlorothiazide ]      SIADH       Social History:   Social History     Tobacco Use    Smoking status: Former     Current packs/day: 0.50     Types: Cigarettes    Smokeless tobacco: Never    Tobacco comments:  10cpd, quit about 2 months ago (April 2025)   Vaping Use    Vaping status: Never Used   Substance Use Topics    Alcohol use: No     Alcohol/week: 0.0 standard drinks of alcohol    Drug use: No       Family History:  Family History   Problem Relation Age of Onset    Breast cancer Daughter 21    Diabetes Mother     Glaucoma Father     Colon cancer Neg Hx     Ovarian cancer Neg Hx     Endometrial cancer Neg Hx     Anesthesia problems Neg Hx     Bleeding Disorder Neg Hx        ROS- 12 system review is negative other than what is specified in the History of Present Illness.      Objective:   Physical Exam  Vitals:    07/15/24 1039   BP: 114/75   BP Site: R Arm   BP Position: Sitting   BP Cuff Size: Medium   Pulse: 74   SpO2: 95%   Weight: 61.2 kg (135 lb)       Wt Readings from Last 3 Encounters:   07/15/24 61.2 kg (135 lb)   07/09/24 61.2 kg (135 lb)   06/25/24 61.3 kg (135 lb 2.3 oz)     General-  Patient is chronically ill-appearing in no acute distress  Neurologic- Alert and oriented X3.  Cranial nerve II-XII grossly intact.  HEENT-  Normocephalic atraumatic head.  No scleral icterus.    Neck- Supple, no carotid bruis, no JVD  Lungs- clear to auscultation, no wheezes, rhonchi, or rhales.  Heart-  regular rhythm with occasional PVC's,  soft ejection murmur at the llsb  Extremities-  No clubbing or cyanosis. No LEE  Pulses- 2+pulses in radial and dorsalis pedis bilaterally.  Psych- Normal mood, appropriate.      Laboratory data:      Component Value Date/Time    PROBNP 1,016.0 (H) 06/13/2024 1621       Lab Results   Component Value Date    WBC 7.3 06/25/2024    HGB 12.3 06/25/2024    HCT 37.9 06/25/2024    PLT 329 06/25/2024       Lab Results   Component Value Date    NA 135 06/25/2024    K 5.1 (H) 06/25/2024    CL 101 06/25/2024    CO2 27.0 06/25/2024    BUN 22 06/25/2024    CREATININE 1.11 (H) 06/25/2024    GLU 186 (H) 06/25/2024    CALCIUM  10.0 06/25/2024    MG 2.0 06/22/2024    PHOS 3.1 04/13/2024       Lab Results   Component Value Date    TSH 3.123 03/01/2024    ALBUMIN  2.8 (L) 06/22/2024    ALT <7 (L) 06/22/2024    AST <8 06/22/2024    INR 1.06 03/04/2024       Lab Results   Component Value Date    LDL 75 04/23/2024    HDL 53 04/23/2024       Most Recent Imaging:     LHC  No prior     Echo  03/11/24  1. The left ventricular systolic function is hyperdynamic, LVEF is visually  estimated at 70%.    2. The left atrium is dilated in size.    3. The right ventricle is moderately dilated in size, with moderately  reduced systolic function.    4. There is severe pulmonary hypertension.    5. TR maximum velocity: 4.6 m/s  Estimated PASP: 96 mmHg.    6. The right atrium is dilated in size.    04/10/23    1. The left ventricle is normal in size with mildly increased wall  thickness.    2. The left ventricular systolic function is normal, LVEF is visually  estimated at > 55%.    3. The mitral valve leaflets are mildly thickened with normal leaflet  mobility.    4. Mitral annular calcification is present (moderate).    5. There is mild mitral valve regurgitation.    6. The aortic valve is trileaflet with mildly thickened leaflets with normal  excursion.    7. There is mild aortic regurgitation.    8. The right ventricle is normal in size, with normal systolic function.    9. There is moderate to severe tricuspid regurgitation.    10. There is moderate pulmonary hypertension.    11. There is mild pulmonic regurgitation.  10/25/21    1. The left ventricle is normal in size with mildly increased wall  thickness.    2. The left ventricular systolic function is normal, LVEF is visually  estimated at > 55%.    3. The mitral valve leaflets are mildly thickened with normal leaflet  mobility.    4. There is mild mitral valve regurgitation.    5. The aortic valve is probably trileaflet with mildly thickened leaflets  with mildly reduced excursion.    6. There is mild aortic regurgitation.    7. The left atrium is mildly dilated in size.    8. There is moderate pulmonary hypertension.    9. The right ventricle is mildly dilated in size, with normal systolic  function.    10. There is moderate tricuspid regurgitation.    11. The right atrium is mildly dilated  in size.  06/27/20    1. The left ventricle is normal in size with mildly increased wall thickness.    2. The left ventricular systolic function is normal, LVEF is visually estimated at 60-65%.    3. The right ventricle is mildly dilated in size, with normal systolic function.    4. There is grade II diastolic dysfunction (elevated filling pressure).    5. The left atrium is mildly dilated in size.    6. The right atrium is mildly dilated  in size.    7. There is mild aortic regurgitation.    8. There is moderate to severe tricuspid regurgitation.    9. There is severe pulmonary hypertension, estimated pulmonary artery systolic pressure is 71 mmHg.    10. The aorta at the ascending aorta is mildly dilated.     CT Chest    06/06/23  1. As evident on prior imaging, there is extensive volume in the lung bases associated with mass effect from herniation of a large amount of abdominal contents into the caudal aspect of the mediastinum. However, this study demonstrates new complete lack of aeration of the lower lobe of the left lung which likely combination of atelectasis and consolidation. Associated extensive debris within the segmental airways may relate to infection, retained mucus, or aspiration. I favor the latter. No potentially associated obstructing mass lesion is identified. However, consider CT imaging surveillance in 2 to 3 months to ensure improvement.  2. Notable chronic findings include:  - Calcified atherosclerotic disease within the coronary arterial distribution (heavy)  -  Advanced destructive centrilobular and substantial paraseptal emphysema, similar to prior.  -Prominent pulmonary pulmonary trunk (3. 7 cm), which may reflect some element of pulmonary arterial hypertension  06/22/20  -- No evidence of intrathoracic mass as clinically questioned.  -- Dilated main pulmonary trunk measuring up to 3.9 cm which is nonspecific, however can be seen in the setting of pulmonary hypertension.  -- Grossly unchanged large hiatal hernia with associated adjacent atelectasis.  -- Additional chronic and incidental findings as described above.     NM Spect Mult / ETT 06/29/20  - No evidence for significant ischemia or scar is noted.  - Post stress: Global systolic function is normal. The ejection fraction was greater than 65%.  - Coronary and thoracic aorta calcifications as well as dilated main pulmonary trunk noted on CT, better characterized on recent chest CT scan dated 06/22/2020  - Large hiatal hernia noted, better characterized on recent CT abdomen 06/22/2020     Normal pharmacological stress ECG, nuclear images will be dictated separately.  No significant ST segment changes or arrhythmias were noted during stress  No significant chest pain symptoms reported     PVL Carotid Duplex 12/06/20  Right Carotid: There is evidence in the ICA of a less than 40% stenosis. Non-hemodynamically significant plaque noted in the CCA.  Left Carotid: There is evidence in the ICA of a less than 40% stenosis. Non-hemodynamically significant plaque noted in the CCA. Plaque noted in the ECA.  Vertebrals:  Both vertebral arteries were patent with antegrade flow.  Subclavians: Normal flow hemodynamics were seen in bilateral subclavian arteries.    ECG from 10/19/20 shows NSR, no PVCs    07/15/24 - NSR w/  frequent PVCs. Septal infarct

## 2024-07-15 NOTE — Unmapped (Signed)
 Will check labs today    May need to adjust lasix  dose if dehydrated.

## 2024-07-15 NOTE — Unmapped (Signed)
 You have a fax in your box requiring your signature.

## 2024-07-16 NOTE — Unmapped (Signed)
 Fax sent to: [facility]   Type of form: Physician's Order  Order/Tracking #: [id number]  Fax: [phone]  Confirmed: Yes

## 2024-07-20 NOTE — Unmapped (Signed)
 Copied from CRM #[id number]. Topic: Other - Other  >> Jul 20, 2024  9:59 AM Janina SAUNDERS wrote:  The PAC has received an incoming clinical call:    Caller name: Jade Burkard   Best callback number: [phone]  or [phone]  Relationship to Patient: Patient   Describe the reason for the  Patient would like a call to discuss test results

## 2024-07-21 NOTE — Unmapped (Signed)
 [facility] Administered Medication Refill Coordination Note      NAME:Naketa Tishina Lown DOB: 05-14-1941      Medication: fluphenazine  decanoate    Day Supply: 30 days       SHIPPING      Next delivery from [facility] [phone]) to [facility] for [place] is scheduled for 07/30    Clinic contact: [name]     Patient's next nurse visit for administration: 08/04    We will follow up with clinic monthly for standard refill processing and delivery.      Ynez Eugenio LITTIE Hope  [facility]

## 2024-07-22 DIAGNOSIS — M158 Other polyosteoarthritis: Principal | ICD-10-CM

## 2024-07-22 DIAGNOSIS — Z8551 Personal history of malignant neoplasm of bladder: Principal | ICD-10-CM

## 2024-07-22 DIAGNOSIS — G603 Idiopathic progressive neuropathy: Principal | ICD-10-CM

## 2024-07-22 DIAGNOSIS — R600 Localized edema: Principal | ICD-10-CM

## 2024-07-22 DIAGNOSIS — M4712 Other spondylosis with myelopathy, cervical region: Principal | ICD-10-CM

## 2024-07-22 DIAGNOSIS — I4891 Unspecified atrial fibrillation: Principal | ICD-10-CM

## 2024-07-22 DIAGNOSIS — M79605 Pain in left leg: Principal | ICD-10-CM

## 2024-07-22 DIAGNOSIS — M4802 Spinal stenosis, cervical region: Principal | ICD-10-CM

## 2024-07-22 DIAGNOSIS — L8990 Pressure ulcer of unspecified site, unspecified stage: Principal | ICD-10-CM

## 2024-07-22 DIAGNOSIS — I639 Cerebral infarction, unspecified: Principal | ICD-10-CM

## 2024-07-22 DIAGNOSIS — M79604 Pain in right leg: Principal | ICD-10-CM

## 2024-07-22 DIAGNOSIS — L89152 Pressure ulcer of sacral region, stage 2: Principal | ICD-10-CM

## 2024-07-22 DIAGNOSIS — K56609 Unspecified intestinal obstruction, unspecified as to partial versus complete obstruction: Principal | ICD-10-CM

## 2024-07-22 MED ORDER — MEDICAL SUPPLY ITEM
0 refills | 0.00000 days | Status: CP
Start: 2024-07-22 — End: ?

## 2024-07-22 MED ORDER — DILTIAZEM CD 180 MG CAPSULE,EXTENDED RELEASE 24 HR
ORAL_CAPSULE | Freq: Every day | ORAL | 3 refills | 100.00000 days | Status: CP
Start: 2024-07-22 — End: ?

## 2024-07-22 NOTE — Unmapped (Signed)
 Chief Complaint   Patient presents with    Follow-up    Home Care     Evaluation for power chair        ASSESSMENT/PLAN:    Problem List Items Addressed This Visit          Cardiovascular and Mediastinum    Atrial fibrillation, unspecified type       Relevant Medications    dilTIAZem  (CARDIZEM  CD) 180 MG 24 hr capsule       Face To Face evaluation 60 mins today to discuss her mobility needs and her equipment needs. The primary purpose for this visit was to discuss her mobility needs and need for a wheelchair. She has multiple diagnoses, including sacral decub, severe osteomalacia of cervical spine, arthritis neck, and edema and neuropathy of legs that make her unable to perform ADLs with cane, walker or manual wheelchair alone. Her diagnoses severely limit her ability to perform all mobility related activities of daily living within her home. Her neuropathy and arm and leg weakness upper and lower extremities impede all functions throughout the day that require mobility without assistance. She cannot hold herself up to walk independently, or with a cane or walker, and she cannot move a manual wheelchair without assistance due to poor dexterity, balance, and likelihood of frequent falls. Ms Uber is a full time wheelchair user and needs a power wheelchair to allow for positioning, effective and efficient propulsion. She needs a power scooter- a cane, walker or manual wheelchair are not sufficient as she cannot use independently- it is too difficult to ambulate because of severe arthritis of spine. The power wheelchair will also allow her to perform her MRADLs at home that she also cannot accommodate and remain independent if relegated to cane, walker or manual wheelchair. Because of her edema in her legs and need to help in transfers, and the decubiti on her buttocks, she also needs power wheelchairs with power tilt (for pressure relief), power elevating legrests (for her edema) and power seat elevate (to allow her to reach items in her kitchen and to aid in transfers). She can operate a power scooter as she has good cognition and can operate the controls appropriately.  ORDER written. Exam below    In addition, we also discussed 40 mins additional her ongoing med problems and recent admission for a pulmonary embolus. This includes:   Change meds  Updated problem list annotations  Database review and update  Med review and update  Lab review and update  Counseling provided and patient education    SUBJECTIVE:    Ms. Geigle is a 83 y.o. female that presents to clinic today regarding the following issues:    BP concern  Abd wound healing well  Buttocks wound a little more delayed  Wonders about lasix  for edema  Recent hospitalization  Increasingly uinable to move around house without assistance  worsening dyspnea worse with lying flat and ambulating. Work up c/w dyspneas 2/2 RVF and fluid hypervolemia, PE, and PNA.   06/13/24 CTA chest-->  Impression  1. Subacute or chronic occlusive segmental right lower lobe pulmonary embolus. Poor opacification of distal pulmonary artery branches in the left lower lobe likely reflects poor right heart output, though additional small distal emboli are not excluded.  2. Dilated right atrium. Enlarged central pulmonary arteries, reflecting known pulmonary hypertension.  3. Upper lung-predominant emphysema. Multifocal pulmonary infectious opacities, more extensive in the left lung.  4. Large posterior diaphragmatic hernia    PMH + COPD,  Pulm Embolus S/P hernia incarceration/surgery, Neuropathy, DM, Tobacco Abuse, Chronic pain, HTN, DJD neck, hip, cervical stenosis, cervical radiculopathy, Chronic renal disease, Depression , Onychomycosis, Ca Trigone Bladder (+ parastomal pain status post neoadjuvant chemotherapy and radical cystectomy with Ileal Conduit on 10/05/2011 for T2N0M0 bladder cancer), Cervical Myofascial pain, chronic tension headaches with medication overuse (Goody powders), left reverse total shoulder arthroplasty, large hiatal hernia, Pyelonephritis, Kidney stones.      reports that she has quit smoking. Her smoking use included cigarettes. She has never used smokeless tobacco.    The following portions of the patient's history were reviewed and updated as appropriate: allergies, current medications, past family history, past medical history, past social history, past surgical history and problem list.    Review of systems for 10 organ systems conducted and no significant positives      OBJECTIVE:    Vital signs have been reviewed:   Vitals:    07/22/24 1144   BP: 146/79   Pulse: 72   Temp: 36.2 ??C (97.2 ??F)     Height and weight noted   Neck some limitation movement  Arms with strength diminished in rom, symmertical, cannot raise above head  Weakness on both 3/5  Hand strength good  Some numbness both arms and legs  Legs strength 3-4/5, but overall weak, and unsteady when tries to stand, edema up to knees- painful to move around  Lungs cta  Cv rsr- some ectopy  Neuro- answers appropriately- non focal CN intact, toes down, no acute deficits  Cannot walk but can pick up items with hands, can lift legs to look at legs

## 2024-07-22 NOTE — Unmapped (Signed)
 Form Faxed to [employer]

## 2024-07-23 NOTE — Unmapped (Signed)
 [facility] [place] POPULATION HEALTH  Care Management Progress Note    Date: 07/23/2024  Outcome:  Phone outreach completed    Is this patient in [place] ? Yes    Purpose of contact:           CM attempted to contact patient's daughter after PCP appointment and to return patient's VM. Unable to leave message, voicemail box was full.     Additional Information/Plan:  Patient provided my direct contact information and encouraged to contact me should additional needs arise.    [name], MSW  Population Health  [facility] [place]

## 2024-07-27 ENCOUNTER — Ambulatory Visit: Admit: 2024-07-27 | Payer: MEDICARE

## 2024-07-27 DIAGNOSIS — I4891 Unspecified atrial fibrillation: Principal | ICD-10-CM

## 2024-07-27 DIAGNOSIS — M4802 Spinal stenosis, cervical region: Principal | ICD-10-CM

## 2024-07-27 NOTE — Unmapped (Signed)
 [facility] FAMILY MEDICINE [place] POPULATION HEALTH  Care Management Progress Note    Date: 07/27/2024  Outcome:  Phone outreach completed    Is this patient in [place] ? Yes    Purpose of contact:           CM contacted patient's daughter to return call/continue follow-up. Patient's daughter reported that she had wanted to follow up on documentation needs related to the [facility]. Discussed recent visit (appears to be documentation in the note) and discussed next steps. Patient's daughter aware that she cannot get the PWC for her mother until the issue with previous DME supplier is sorted. She states she will go there in person today to speak with them and will bring the billing documentation. Encouraged pt's daughter to contact CM once she has spoken with them.     CM to message PT at wheelchair clinic, Reena Grate and PCP to discuss documentation needs and ensure that the company present for the wheelchair fitting has what they need. Staff message sent.     Additional Information/Plan:  Patient provided my direct contact information and encouraged to contact me should additional needs arise.    [name], MSW  Population Health  [facility] FAMILY MEDICINE [place]

## 2024-07-28 MED FILL — FLUPHENAZINE DECANOATE 25 MG/ML INJECTION SOLUTION: INTRAMUSCULAR | 30 days supply | Qty: 5 | Fill #6

## 2024-07-28 NOTE — Unmapped (Signed)
 [facility] FAMILY MEDICINE [place] POPULATION HEALTH  Care Management Progress Note    Date: 07/28/2024  Outcome:  Phone contact completed    Is this patient in [place] ? Yes    Purpose of contact:           Patient's daughter contacted CM - she needed dates of most recent hospitalization. Provided this information to patient's daughter.     Patient requested assistance regarding [employer] orders, currently receiving supplemental nutrition.  Only getting one case per month   Used to get three cases per month    Got the oxygen meter, community case manager is working on getting a scale for the patient as well.    Followed up with patient regarding progress on PWC. Updated her that [employer] can submit the documentation available, but further action will not be able to be taken until the medicare payment issue is resolved. Patient's daughter states she did not go in-person to discuss this with [employer] yesterday, but will do so shortly.    CM to call [employer] to inquired about supplemental nutrition orders.    Additional Information/Plan:  Patient provided my direct contact information and encouraged to contact me should additional needs arise.      [name], MSW  Population Health  [facility] FAMILY MEDICINE [place]

## 2024-07-30 DIAGNOSIS — I639 Cerebral infarction, unspecified: Principal | ICD-10-CM

## 2024-07-30 DIAGNOSIS — M4802 Spinal stenosis, cervical region: Principal | ICD-10-CM

## 2024-07-30 NOTE — Unmapped (Signed)
 Fax sent to: [facility]  Type of form: Add On Discipline   Order/Tracking #: [id number]  Fax: [phone]  Confirmed: Yes

## 2024-07-30 NOTE — Unmapped (Signed)
 You have a fax in your box requiring your signature.

## 2024-07-30 NOTE — Unmapped (Signed)
 [facility] FAMILY MEDICINE [place] POPULATION HEALTH  Care Management Progress Note    Date: 07/30/2024  Outcome:  Phone outreach completed    Is this patient in [place] ? Yes    Purpose of contact:           CM contacted [employer] [facility] at [phone] and left VM.     Additional Information/Plan:  Patient provided my direct contact information and encouraged to contact me should additional needs arise.    Time Spent Per Day:      [name], MSW  [facility] FAMILY MEDICINE [place]

## 2024-07-31 NOTE — Unmapped (Signed)
There is a form in your box requiring your signature.

## 2024-08-03 MED ADMIN — fluPHENAZine decanoate (PROLIXIN) injection 50 mg: 50 mg | INTRAMUSCULAR | @ 17:00:00

## 2024-08-03 NOTE — Unmapped (Signed)
 Administered long-acting injectable today, Right deltoid. See MAR for additional documentation. [address],  [facility]

## 2024-08-03 NOTE — Unmapped (Signed)
 [facility] FAMILY MEDICINE [place] POPULATION HEALTH  Care Management Progress Note    Date: 08/03/2024  Outcome:  Phone outreach completed    Is this patient in [place] ? Yes    Purpose of contact:           Received call from Nutrition Services with [employer]. States their department only does nutritional counseling. Correct contact is actually [facility] who does supply Ensure Shakes, often only for those who are already urology patients. Their contact is [phone].    Contacted [facility] to further identify needs for nutrition prescription. Spoke with [place] who confirmed they do send the Boost high-protein to the patient.     Per [employer], Last two orders have been sent with one case and patient's daughter already called in last week regarding this. They need to send a new authorization. In order to change or increase anything, the authorization will need to come from the CAP case manager, and PCP can sign off on any orders that come through.     ---    Update 8/5: Contacted patient's daughter [name] regarding [employer]'s instructions for updating the patient's Boost. Shared with them that per [employer], the authorization should come from CAP case manager and that typically they will send back to the PCP anything that needs to be signed.     Patient's daughter states that Dr. Isaiah had sent it in during transitions clinic, but CM unable to locate any orders in chart.    Attempted to further discuss speaking with CAP case manager for placing the order. However, patient's daughter expressed that she believes this should be able to come directly from the PCP's office like last time. Upon further discussion, identified that the family has an issue with TEFL teacher and CAP authorizations. According to the patient's daughter, [employer] had recently informed them they need a new CAP Case Management provider and they have ceased communications at this time. Patient's daughter states they referred them to a different agency, which patient reported was [place] [facility]. Patient's daughter reported she has gone up the ladder and states they did not get a termination letter in writing and she is also planning to appeal this decision. Patient's daughter expressed significant frustrations related to this. CM attempted to identify if there will be funding disruptions that might make payment for items/services such as Boost delayed, but it seems to be unclear at this time.    Patient again insisted that PCP's office place orders. Discussed with patient that CM can request an order for 3 cases of Boost be placed, but reiterated that with CAP service disruptions this could present challenges.     Additional Information/Plan:  Patient provided my direct contact information and encouraged to contact me should additional needs arise.    [name], MSW  Population Health  [facility] FAMILY MEDICINE [place]

## 2024-08-04 NOTE — Unmapped (Signed)
 Hi Dr. Elige,    There is a form in your box requiring your signature

## 2024-08-06 NOTE — Unmapped (Signed)
Form faxed to [employer]

## 2024-08-06 NOTE — Unmapped (Signed)
 Signed form faxed to [facility] health [phone]

## 2024-08-07 NOTE — Unmapped (Signed)
There is a form in your box requiring your signature.

## 2024-08-10 NOTE — Unmapped (Signed)
 [facility]: New Patient      Chief Complaint: Osteoporosis     HPI  Tammy Braun is a 83 y.o. woman with a history of hypertension, CAD, atrial fibrillation, HFpEF, pulmonary embolism, stage 2-3 CKD, type 2 diabetes, schizoaffective disorder and bladder cancer s/p cystectomy with ileal conduit who is seen in consultation today at the request of Lynwood Mitchell Rake for recommendations regarding osteoporosis.     She has a very complicated medical history, particularly over recent months. 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 She has not been treated but treatment was discussed with her PCP. Her daughter, who is her HCDM, expressed concerns about potential adverse effects of bisphosphonate therapy, so she is referred to clinic today to discuss therapies in more detail.    To review her recent medical history, she was recently admitted 02/2024-03/2024. Initially, she presented to the ED for evaluation of nausea and vomiting with concern for SBO involving her ileal conduit.  Her course was complicated by hypoxic respiratory failure prior to surgical intervention which required mechanical ventilation.  She ultimately underwent exploratory laparotomy with small bowel resection and was initially left with an open abdomen.  She then returned to the OR several days later but could not tolerate closure at that time but did ultimately tolerate this on 03/03/2024.  She did require continuous renal replacement therapy during that time and after extubation required reintubation x 2 for recurrent hypoxic respiratory failure.  She was then admitted again in June for worsening shortness of breath and was treated for pneumonia.  Currently, she is residing with her daughter.  She has not able to ambulate independently at this time.    Fracture history: No    Dietary calcium  intake: Minimal     Calcium  supplement: None    Vitamin D: None    Physical activity and lifestyle: No  Menstrual and reproductive history: 20s  Family history of fragility fracture:No      Summary of Prior Treatment: None      Medical History Surgical History   - Pulmonary embolism  - COPD  - bladder cancer   - cystectomy with Ileal conduit  - exploratory lap with lysis of adhesions 02/2024     Family History Social History   Family History[1]   She lives in [place] with daughter. She is a former smoker but quit during her recent hospitalization.       Current Medications[2]       Allergies[3]     Review of Systems   Pertinent positive and negative systems are described in the HPI; the remainder of the 14 systems are negative.      There were no vitals taken for this visit.   Physical Exam  Constitutional:       Appearance: She is ill-appearing.   Neurological:      Mental Status: She is alert.      Comments: She is not oriented to the current situation as best I can determine. Throughout the visit, she asked repeatedly if I am going to check her blood sugar.           Laboratory Review: I personally reviewed labs 08/12/24.    06/2024  WBC 8.3  Hgb 12.9  Na 138  K 4.1  Cr 0.75  Ca 10    03/2024  25-OHD 26      Radiology Review: I personally reviewed pertinent radiology 08/12/24.  DXA Review:  I personally reviewed and interpreted 08/11/2024. Femoral neck placement is suboptimal, with significant involvement of pelvis. Spine is very scoliotic and likely significantly underestimates density at this site.    Date Lumbar Spine Femoral Neck Total Hip   08/2022 -1.8 -2.9 -3.6                 Other medical data:   Reviewed and summarized (above) records in preparation for today's visit all pertinent notes in Epic/Media and CareEverywhere as well as any sent records.       Impression and Plan:     Tammy Braun is a 83 y.o. woman with a history of hypertension, CAD, atrial fibrillation, HFpEF, pulmonary embolism, stage 2-3 CKD, type 2 diabetes, schizoaffective disorder and bladder cancer s/p cystectomy with ileal conduit who is seen in consultation today at the request of Lynwood Mitchell Rake for recommendations regarding osteoporosis.    1.  Osteoporosis: Ms. Leisey is seen in consultation today.  Her daughter accompanies her during the visit and provides all relevant information and makes all relevant medical decisions as the patient is not able to do so at this time.  We reviewed the diagnosis of osteoporosis based on her bone density study from a couple of years ago.  We discussed that the value seen on the study 2 years ago may not reflect the values that would be seen on his study now.  Given her very complicated recent medical history with an extended hospitalization including severe critical illness I suspect that her bone density is actually quite a bit worse than it was in 2023.  I think that if she were strongly considering the possibility of pharmacologic therapy, an updated bone density study would be of value so that we have a sense of trends over time.  We discussed relevant factors including improving nutritional status and loading of bone with ongoing physical therapy.  We discussed optimizing vitamin D and calcium  intake.  Her daughter feels that she will require a calcium  supplement because she does not feel she can get adequate calcium  from her very limited food intake currently.  In terms of pharmacologic therapy, we discussed the rationale for use of these agents, primarily to reduce the risk of fragility fracture and improve bone mineral density.   Lastly, we discussed the role of pharmacologic therapy. We discussed that all FDA approved agents have been demonstrated to reduce the risk of fragility fracture and this is ultimately the reason to consider treatment. We discussed bisphosphonates, both IV and oral, denosumab, PTH/PTHrP, and romosozumab. We reviewed risks of BiP including esophagitis, AFF, ONJ. We discussed treatment course of between 3-5 yrs depending on formulation. We then discussed how denosumab differs including that it is a monoclonal antibody targeting RANKL and that unlike BiP, it cannot be discontinued without transition to bisphosphonate given rapid bone loss and high rebound fracture risk. We also reviewed that longer term use of denosumab is feasible (at least 10 yrs). We discussed PTHrP/PTH including that these are anabolic agents given as daily injection. They do not carry risk of ONJ or AFF as they have no anti-resorptive properties. They tend to be most effective at the spine. Risks include hypercalcemia.  Lastly, we discussed romosozumab, another monoclonal antibody targeting sclerostin. This medication has both anabolic and anti-resorptive effects on bone. It is currently approved for 1 year and is given monthly (12 injections). It is generally well-tolerated and is most effective when give prior to antiresorptive therapy. In one  study, it was associated with a small but significant increased risk of CV events. It remains unclear whether this is a true effect of the drug or not and subsequent studies have not clearly shown this risk. This too, has to be followed by another therapy. I did discuss with her daughter that given her marked decline in recent months, the absolute benefits of treatment may be small.  I think that if pharmacologic therapy is pursued, bisphosphonate therapy may be the most appropriate option. This option provides the most flexibility in terms of dosing and although she was on RRT in the hospital, her renal function as recovered. Her daughter expresses concern about the possibility of her renal function worsening again in the future if she gets sick.This seems very plausible, but I reviewed with her that receiving a dose of a bisphosphonate at some time point isolated in time from any future illness would not be expected to have an effect on renal function. Given her history, if a bisphosphonate was used, I would ensure that her renal function was adequate prior to treatment and if an infusion was given, I would try to infuse this as slowly as possible to minimize peak serum levels. I sensed some skepticism from her daughter on this point. Denosumab could be used, but I do worry a lot about ability to adhere to the strict schedule and risk of rebound fracture if an injection is missed due to worsening clinical status. I also worry about the much higher risk of hypocalcemia given her suboptimal nutritional status.  I do not think that romosozumab is a good option for her both given the requirement for dosing monthly and given the potential increased cardiovascular risk in the setting of a patient with extensive atherosclerotic disease.  Her daughter would like to consider options.  - Attempt to get 1200 mg of calcium /day between diet and supplement  -Family requested that I prescribe a calcium  supplement rather than have her use an over-the-counter supplement so I prescribed calcium  carbonate 600 mg to be taken twice daily with food.  The supplement also contains vitamin D.  - Maintain 25-OH D level > 30  - Consider treatment options discussed  - Continue regular dental care           The patient and I dicussed the potential risks and benefits of therapy. I recommend dental evaluation and completion of any invasive dental procedures prior to initiation of therapy. The patient was given a handout on our current understanding of rare side effects of therapy including osteonecrosis of the jaw and atypical fractures.    Return if symptoms worsen or fail to improve.        Fonda CHRISTELLA Provencal, MD         [1]   Family History  Problem Relation Age of Onset    Breast cancer Daughter 66    Diabetes Mother     Glaucoma Father     Colon cancer Neg Hx     Ovarian cancer Neg Hx     Endometrial cancer Neg Hx     Anesthesia problems Neg Hx     Bleeding Disorder Neg Hx    [2]   Current Outpatient Medications   Medication Sig Dispense Refill    acetaminophen  (TYLENOL ) 500 MG tablet Take 1 tablet (500 mg total) by mouth every six (6) hours. 30 tablet 0    albuterol  HFA 90 mcg/actuation inhaler Inhale 2 puffs every four (4) hours as needed for wheezing. 18  g 11    apixaban  (ELIQUIS ) 5 mg Tab Take 1 tablet (5 mg total) by mouth two (2) times a day. 180 tablet 1    blood sugar diagnostic (GLUCOSE BLOOD) Strp Disp test strips preferred by insurance plan. Testing qday, Dx: E11.9 (Type 2 DM- controlled) 30 strip 11    blood-glucose meter kit Disp. blood glucose meter kit preferred by patient's insurance. Check blood sugars as directed by provider. Dx: Diabetes, E11.9 1 each 1    calcium  carbonate-vitamin D3 (CALCIUM  600 + D,3,) 600 mg-10 mcg (400 unit) per tablet Take 1 tablet by mouth two (2) times a day. 180 tablet 2    diclofenac  sodium (VOLTAREN ) 1 % gel Apply 2 grams topically four (4) times a day. 100 g 1    dilTIAZem  (CARDIZEM  CD) 180 MG 24 hr capsule Take 1 capsule (180 mg total) by mouth daily. 100 capsule 3    fluPHENAZine  decanoate (PROLIXIN ) 25 mg/mL injection Inject 2 mL (50 mg total) into the muscle every thirty (30) days. 5 mL 11    furosemide  (LASIX ) 20 MG tablet Take 1 tablet (20 mg total) by mouth daily. 30 tablet 5    gabapentin  (NEURONTIN ) 100 MG capsule Take 1 capsule (100 mg total) by mouth Three (3) times a day as needed (pain). 90 capsule 0    inhalational spacing device (E-Z SPACER) Spcr 1 each by Miscellaneous route every four (4) hours as needed. 1 each 0    lancets Misc Disp. lancets #30 or amount allowed, Testing once Qday. Dx: E11.9 (Diabetes- controlled) 30 each 11    MEDICAL SUPPLY ITEM Entreal formula nutritionally supplement complete caloric density 3 application 3    MEDICAL SUPPLY ITEM Compression stockings knee high 2 each 0    MEDICAL SUPPLY ITEM Group 2 Power Wheelchair, Research scientist (life sciences) , Power Elevating Leg Rests, and Tour manager. 1 each 0    miscellaneous medical supply Misc 1 each by Miscellaneous route four (4) times a day. 100 each 0 polyethylene glycol (GLYCOLAX ) 17 gram/dose powder Dissolve one capful (17 grams) in 4 to 8oz of fluid and drink by mouth daily as directed. 510 g 2    SENNA 8.6 mg tablet Take 2 tablets by mouth nightly. 60 tablet 2    underpads 2.6 X 2.9 feet Pads 1 each by Miscellaneous route two (2) times a day. 100 each 3     Current Facility-Administered Medications   Medication Dose Route Frequency Provider Last Rate Last Admin    acetaminophen  (TYLENOL ) solution 325 mg  325 mg Oral Once Isaiah Lynwood Simpers, MD        fluPHENAZine  decanoate (PROLIXIN ) injection 50 mg  50 mg Intramuscular Q30 Days    50 mg at 08/03/24 1245   [3]   Allergies  Allergen Reactions    Lisinopril Anaphylaxis and Swelling    Losartan  Dizziness     Other Reaction(s): Dizziness    Hctz [Hydrochlorothiazide ]      SIADH

## 2024-08-11 ENCOUNTER — Ambulatory Visit: Admit: 2024-08-11 | Discharge: 2024-08-12 | Payer: MEDICARE

## 2024-08-11 MED ORDER — CALCIUM 600 MG (AS CARBONATE)-VITAMIN D3 10 MCG (400 UNIT) TABLET
ORAL_TABLET | Freq: Two times a day (BID) | ORAL | 2 refills | 90.00000 days | Status: CP
Start: 2024-08-11 — End: 2025-08-11

## 2024-08-11 NOTE — Unmapped (Addendum)
 Bisphosphonate Info    - Bisphosphonates are  commonly prescribed for management of osteopenia and osteoporosis. This class of medications is generally very safe, and the benefits of therapy far outweigh the risks. Despite that, like any medication, there are potential side effects that you should be aware of. These are discussed below.     1. Upper GI Side Effects: These can occur with oral bisphosphonate therapy. This generally includes reflux and esophagitis. These are very rare if proper technique is used (remaining upright for 30-60 minutes after administration).     2. Flu-like Symptoms: This can occur within 24-72 hrs of IV bisphosphonate administration. Trials suggest that this occurs in around 30% of people during the first infusion, but occurs much less frequently with subsequent infusions. Typical symptoms include low grade fever and muscle/joint aches. This can be managed with ibuprofen  (if not otherwise contraindicated) or tylenol .     3. Low calcium : This can occur with any form of bisphosphonate. This is rare and the risk is reduced by maintaining adequate calcium  and vitamin D intake (both supplements and diet).    4. Osteonecrosis of the jaw: This is a very rare side effect that may be more common with IV formulations but has been described with both oral and IV forms. This typically presents with pain and swelling of the jaw. It is estimated that one case occurs for every 10,000-100,000 years of therapy. The risk may be increased in those with pre-existing dental disease or poorly-fitting dentures, so we recommend that any outstanding dental issues be addressed with your dentist prior to starting therapy.     5. Atypical Femur Fractures: This is another very rare side effect. The exact risk seems to depend on several factors including age, duration of treatment, and ethnicity. Below are some rough estimates of risk based on duration of treatment:    Duration of Bisphosphonate Therapy (yrs) Atypical Fractures (per 10,000 yrs of treatment)   0-3 0.56   3 to < 5 2.54   5 to < 8 6.04   > 8  13.10       Calcium   - We recommend you get 1000-1200 mg calcium  between diet. In this case, we will plan to use calcium  600 mg twice daily with meals.     Vitamin D: I recommend starting D3 1000 units daily.

## 2024-08-13 ENCOUNTER — Inpatient Hospital Stay: Admit: 2024-08-13 | Discharge: 2024-08-13 | Payer: MEDICARE

## 2024-08-13 DIAGNOSIS — J432 Centrilobular emphysema: Principal | ICD-10-CM

## 2024-08-13 DIAGNOSIS — M4802 Spinal stenosis, cervical region: Principal | ICD-10-CM

## 2024-08-13 DIAGNOSIS — I10 Essential (primary) hypertension: Principal | ICD-10-CM

## 2024-08-13 DIAGNOSIS — M47812 Spondylosis without myelopathy or radiculopathy, cervical region: Principal | ICD-10-CM

## 2024-08-13 DIAGNOSIS — J189 Pneumonia, unspecified organism: Principal | ICD-10-CM

## 2024-08-13 DIAGNOSIS — E1149 Type 2 diabetes mellitus with other diabetic neurological complication: Principal | ICD-10-CM

## 2024-08-13 DIAGNOSIS — M79671 Pain in right foot: Principal | ICD-10-CM

## 2024-08-13 DIAGNOSIS — I639 Cerebral infarction, unspecified: Principal | ICD-10-CM

## 2024-08-13 DIAGNOSIS — R059 Cough, unspecified type: Principal | ICD-10-CM

## 2024-08-13 DIAGNOSIS — M810 Age-related osteoporosis without current pathological fracture: Principal | ICD-10-CM

## 2024-08-13 DIAGNOSIS — F259 Schizoaffective disorder, unspecified: Principal | ICD-10-CM

## 2024-08-13 DIAGNOSIS — Z8719 Personal history of other diseases of the digestive system: Principal | ICD-10-CM

## 2024-08-13 DIAGNOSIS — Z8619 Personal history of other infectious and parasitic diseases: Principal | ICD-10-CM

## 2024-08-13 DIAGNOSIS — I4891 Unspecified atrial fibrillation: Principal | ICD-10-CM

## 2024-08-13 DIAGNOSIS — N182 Chronic kidney disease, stage 2 (mild): Principal | ICD-10-CM

## 2024-08-13 DIAGNOSIS — M79672 Pain in left foot: Principal | ICD-10-CM

## 2024-08-13 DIAGNOSIS — E559 Vitamin D deficiency, unspecified: Principal | ICD-10-CM

## 2024-08-13 MED ORDER — DOXYCYCLINE HYCLATE 100 MG TABLET
ORAL_TABLET | ORAL | 1 refills | 0.00000 days | Status: CP
Start: 2024-08-13 — End: ?

## 2024-08-13 NOTE — Unmapped (Signed)
 Form faxed to [phone]

## 2024-08-13 NOTE — Unmapped (Signed)
 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    Atrial fibrillation, unspecified type    (CMS-HCC)       Musculoskeletal and Integument    Age-related osteoporosis without current pathological fracture    Arthritis of neck       Genitourinary    Chronic renal disease, stage 2, mildly decreased glomerular filtration rate (GFR) between 60-89 mL/min/1.73 square meter       Other    Schizoaffective disorder    (CMS-HCC)    Spinal stenosis of cervical region    History of small bowel obstruction    Vitamin D deficiency    History of MDR Pseudomonas aeruginosa infection     Other Visit Diagnoses         Pain in both feet        Relevant Orders    XR Foot 3 Or More Views Bilateral (Completed)      Cough, unspecified type        Relevant Medications    doxycycline  (VIBRA -TABS) 100 MG tablet          Antibiotics  Xray  Follow in 4 weeks  Updated problem list annotations  Database review and update  Med review and update  Lab review and update  Counseling provided and patient education    40 minutes with patient- 1/2 in face to face counseling on chronic health conditions and  helath probs complicated    SUBJECTIVE:    Tammy Braun is a 83 y.o. 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 Her smoking use included cigarettes. She has never used smokeless tobacco.    The following portions of the patient's history were reviewed and updated as appropriate: allergies, current medications, past family history, past medical history, past social history, past surgical history and problem list.    Review of systems for 10 organ systems conducted and no significant positives      OBJECTIVE:    Vital signs have been reviewed:   Vitals:    08/13/24 1157   BP: 134/87   Pulse:      Vss  Lungs no wheezes  Legs and arms no change  Skin abd healing well

## 2024-08-14 NOTE — Unmapped (Signed)
 Copied from CRM #[id number]. Topic: Access To Clinicians - Req Clinic Call Back  >> Aug 14, 2024 10:30 AM [place] G wrote:  Reason of the Call: Speak to PCP    Requesting: A Call back from [facility]    Supporting details:Tracy Blinda from [facility] would like to speak to the Pt PCP regarding elder care and and a request send to the facility      Appointment: The patient is scheduled for 09/22/24.  They did not want to be added to wait list.      Randine preferred contact: [phone]  Routine callback turnaround time: 24-48 business hours. Programmer, systems Notified)

## 2024-08-18 NOTE — Unmapped (Signed)
 Copied from CRM #[id number]. Topic: Access To Clinicians - Req Clinic Call Back  >> Aug 18, 2024  2:26 PM Tinnie B wrote:  Clinical Advice: They declined to provide further details. She said it was about some ?x-rays       They have not been seen for this issue previously. I attempted to schedule an appointment, they declined.  The patient preferred contact: Home Phone [phone] Routine callback turnaround time: 24-48 business hours. Programmer, systems Notified)

## 2024-08-18 NOTE — Unmapped (Signed)
 Copied from CRM [id number]. Topic: Access To Clinicians - Clinical Question/Test Result  >> Aug 18, 2024  2:55 PM Terrilyn Lips wrote:  Test Results: The patient  has requested to be contacted regarding their the results for XRAY of the  08/14. Preferred Contact: Cell Phone Telephone Information:  Mobile          [phone]   Routine callback turnaround time: 24-48 business hours. Programmer, systems Notified)

## 2024-08-18 NOTE — Unmapped (Signed)
 Hi Dr. Elige     There is a form in your box requiring your signature

## 2024-08-19 NOTE — Unmapped (Signed)
 [facility] Administered Medication Refill Coordination Note      NAME:Tammy Braun DOB: March 28, 1941      Medication: fluphenazine  decanoate    Day Supply: 30 days       SHIPPING      Next delivery from [facility] [phone]) to [facility] for [name] is scheduled for 08/28    Clinic contact: [name]     Patient's next nurse visit for administration: 09/03    We will follow up with clinic monthly for standard refill processing and delivery.      Esli Clements LITTIE Hope  [facility] and [facility]

## 2024-08-20 NOTE — Unmapped (Signed)
 Copied from CRM #[id number]. Topic: Access To Clinicians - Req Clinic Call Back  >> Aug 20, 2024  2:00 PM Creighton RAMAN wrote:  Randine HERO. With [facility] (Tracy's Cell: [phone]) is needing letter faxed to [phone] for clarification on if the pt has capacity to be an employer and can make there own decisions and pt daughter is advising the opposite. Please confirm with a letter to the fax provided.

## 2024-08-20 NOTE — Unmapped (Signed)
 Copied from CRM [id number]. Topic: Access To Clinicians - Req Clinic Call Back  >> Aug 20, 2024 10:35 AM Hargis HERO wrote:  Clinical Related: Test Results: Test Results: The patient has requested to be contacted regarding their results for XRAY.    Patient is not able to get into [facility] chart     Preferred Contact: Home Phone [phone] (home) Routine callback turnaround time: 24-48 business hours. Programmer, systems Notified)

## 2024-08-20 NOTE — Unmapped (Signed)
 Per previous messages.   Please contact patient back at [phone] ,(results)

## 2024-08-20 NOTE — Unmapped (Unsigned)
 Copied from CRM #[id number]. Topic: Access To Clinicians - Clinical Question/Test Result  >> Aug 20, 2024  2:12 PM [place] G wrote:  Test Results: The patient has requested to be contacted regarding their the results for CT/MRI scan of the XRAY and the results for XRAY of the  Leg. Preferred Contact: Cell Phone Telephone Information:  Mobile          [phone]   Urgent callback turnaround time: within 24 business hours. Programmer, systems Notified)

## 2024-08-20 NOTE — Unmapped (Signed)
 [facility] order [id number] faxed to [phone]

## 2024-08-21 NOTE — Unmapped (Addendum)
 The patient has requested to be contacted regarding their the results for XRAY of the  foot.     She has not read the MyChart message that was sent 08/19/24. I read the message verbatim (with no additional advice or interpretation) to the patient and she requested only Dr. Claudius RN/CMA to call her back.     She complained of ongoing pain and aches in her leg and foot. Declined an appt. Patient requested a call back today but I did advise on call turn around time.     Preferred Contact: Cell - [phone]    Routine callback turnaround time: 24-48 business hours. Programmer, systems Notified)

## 2024-08-24 NOTE — Unmapped (Signed)
 Randine reports, she had a conversation with PCP a few days ago. She is in need of a letter stating pt capacity to be an employer and can make there own decisions.

## 2024-08-24 NOTE — Unmapped (Signed)
 PT Auxillary, Jilll reporting patient has wheezing on auscultation in left upper lobe of lung.  TC to patient's daughter [name] x 2, no answer, unable to leave message. TC to [employer] from [place], states she will have office call [name] and ask [name] to call PCP Office and speak with a nurse. Verified that [name] was the daughter with the patient now.

## 2024-08-24 NOTE — Unmapped (Signed)
 Per previous message, Randine states she is needing documentation sent to her about this information. Needing Dr.Goldstein to write a letter. Randine is needing letter within 2 days (meeting with facility about patient). If there are any questions or concerns please call [phone]

## 2024-08-24 NOTE — Unmapped (Signed)
 Copied from CRM #[id number]. Topic: Scheduling - Confirm Appointment  >> Aug 24, 2024 11:00 AM Tinnie B wrote:  Caller asked to confirm appointment details    Additional Requests/Needs: Yes Clinical Related: Order Question: The patient has questions regarding the following: Radiology: XRAY of Leg they would like the results     [phone]    Routine callback turnaround time: 24-48 business hours. Programmer, systems Notified)

## 2024-08-24 NOTE — Unmapped (Signed)
 Upcoming Appt:  Future Appointments   Date Time Provider Department Center   09/02/2024 11:30 AM [facility] NURSE [facility]   09/16/2024  9:00 AM [name], MD URO2Y[facility]   09/22/2024 11:20 AM Elige [name], MD [facility]   09/25/2024 10:45 AM Maryfrances Recardo RAMAN, MD PSYSTEPGRNB [facility]   10/28/2024 12:30 PM Claudene Cesar HERO, MD UNCPULSPCLET [facility]   01/14/2025 11:15 AM Mazzola Woodfin everitt Solon Unice, MD UNCHGAC[facility]   01/27/2025 11:15 AM Barn[place], Donnice Righter, MD UNCHRTVASET [facility]       Disposition:  Information or Advice Only Call    Encounter Reason for Disposition:    [1] Other NON-URGENT information for PCP AND [2] does not require PCP response      Is this a pediatric patient?   No     Any recent, relevant visit?   Yes     Date/location of recent visit?  08/13/2024 xray of foot    Any related medications?  No    Any relevant medical history?   Yes     What history?  Diabetes- skin burning ongoing issue for pt-declines triage 8/25  Neuropathy     Any interventions?   No      [employer] Eligibility    Patient is eligible for [employer] (according to most recent zip code and insurance information)?  No        Patient is [employer] eligible and appropriate for referral: No     Pt denies any current symptoms that needs triage. Pt questioning about xray 8/14 results. Note in pt chart (she has not gotten my chart message) read to pt:      Elige [name], MD to Tammy Braun        08/19/24  2:07 PM  Your recent xrays of your foot showed:     No acute fracture or dislocation.   No radiographic evidence for inflammatory or erosive arthropathy of the feet.      Juliene Elige     This [facility] Chart message has not been read.      Tammy Braun pt daughter also asking about note to [place] with [employer]. [name] aware Dr Elige is out of the country and will check back in in next 2 weeks about the note.     Encounter Initial Assessment:  No Initial Assessment on file.      Encounter Protocols Used:  PCP Call - No Triage-A-AH

## 2024-08-25 NOTE — Unmapped (Signed)
 Attempted to inform Tammy Braun the PCP is out of the office past the time she will need the letter, no answer.

## 2024-08-26 MED FILL — FLUPHENAZINE DECANOATE 25 MG/ML INJECTION SOLUTION: INTRAMUSCULAR | 30 days supply | Qty: 5 | Fill #7

## 2024-08-26 NOTE — Unmapped (Signed)
 Copied from CRM #[id number]. Topic: Access To Clinicians - Req Clinic Call Back  >> Aug 26, 2024  4:01 PM Duwaine Gens wrote:  Tammy Braun is calling again about urgent need of form/letter as there is a meeting on the pt tomorrow. She would like to speak with someone by end of day.     Call transferred to office    Call back # [phone]

## 2024-08-26 NOTE — Unmapped (Signed)
 Nurse manager spoke with Randine and informed her, the letter will have to wait until PCP returns.

## 2024-08-26 NOTE — Unmapped (Signed)
 Spoke with Tammy Braun. She reports the letter does not have to come specifically from the PCP, any provider can write it. Their meeting is at 10:30 tomorrow morning and really need a response. Fax#: [phone].

## 2024-08-26 NOTE — Unmapped (Signed)
 Please notify Randine back from [facility] today. Needing any provider to sign patient's paperwork, Company is having a meeting tomorrow about patient and is needing the form. Randine Ph Num [phone]

## 2024-09-01 DIAGNOSIS — J189 Pneumonia, unspecified organism: Principal | ICD-10-CM

## 2024-09-01 DIAGNOSIS — R059 Cough: Principal | ICD-10-CM

## 2024-09-01 MED ORDER — ALBUTEROL SULFATE HFA 90 MCG/ACTUATION AEROSOL INHALER
RESPIRATORY_TRACT | 10 refills | 0.00000 days | Status: CP | PRN
Start: 2024-09-01 — End: ?

## 2024-09-01 NOTE — Unmapped (Signed)
 Copied from CRM [id number]. Topic: Access To Clinicians - Medication Refill  >> Sep 01, 2024  4:47 PM Lauren B wrote:  The patient is requesting the following:     Medication(s): albuterol  HFA 90 mcg/actuation inhaler     Quantity for 3 month supply    Pharmacy name and address: [id number] [place], [place] - [address]    Coverage: yes, coverage is accurate on file.    Urgent turnaround time: within 24 business hours. (Caller Notified)    Urgent Reason: Completely out of medication(s)      Does the caller want to be contacted regarding this request? Yes. Please contact The patient by [phone]

## 2024-09-02 ENCOUNTER — Emergency Department
Admit: 2024-09-02 | Discharge: 2024-09-03 | Disposition: A | Discharge status: Home / Self Care | Payer: MEDICARE | Attending: Student in an Organized Health Care Education/Training Program

## 2024-09-02 LAB — COMPREHENSIVE METABOLIC PANEL
ALBUMIN: 3.8 g/dL (ref 3.4–5.0)
ALKALINE PHOSPHATASE: 90 U/L (ref 46–116)
ALT (SGPT): 15 U/L (ref 10–49)
ANION GAP: 16 mmol/L — ABNORMAL HIGH (ref 5–14)
AST (SGOT): 17 U/L (ref <=34)
BILIRUBIN TOTAL: 0.5 mg/dL (ref 0.3–1.2)
BLOOD UREA NITROGEN: 15 mg/dL (ref 9–23)
BUN / CREAT RATIO: 15
CALCIUM: 10 mg/dL (ref 8.7–10.4)
CHLORIDE: 98 mmol/L (ref 98–107)
CO2: 19 mmol/L — ABNORMAL LOW (ref 20.0–31.0)
CREATININE: 1.01 mg/dL (ref 0.55–1.02)
EGFR CKD-EPI (2021) FEMALE: 56 mL/min/1.73m2 — ABNORMAL LOW (ref >=60)
GLUCOSE RANDOM: 162 mg/dL (ref 70–179)
POTASSIUM: 3.6 mmol/L (ref 3.4–4.8)
PROTEIN TOTAL: 8.3 g/dL — ABNORMAL HIGH (ref 5.7–8.2)
SODIUM: 133 mmol/L — ABNORMAL LOW (ref 135–145)

## 2024-09-02 LAB — CBC W/ AUTO DIFF
BASOPHILS ABSOLUTE COUNT: 0 10*9/L (ref 0.0–0.1)
BASOPHILS RELATIVE PERCENT: 0.4 %
EOSINOPHILS ABSOLUTE COUNT: 0 10*9/L (ref 0.0–0.5)
EOSINOPHILS RELATIVE PERCENT: 0.4 %
HEMATOCRIT: 38.8 % (ref 34.0–44.0)
HEMOGLOBIN: 13.2 g/dL (ref 11.3–14.9)
LYMPHOCYTES ABSOLUTE COUNT: 1 10*9/L — ABNORMAL LOW (ref 1.1–3.6)
LYMPHOCYTES RELATIVE PERCENT: 9.7 %
MEAN CORPUSCULAR HEMOGLOBIN CONC: 34.1 g/dL (ref 32.0–36.0)
MEAN CORPUSCULAR HEMOGLOBIN: 29.6 pg (ref 25.9–32.4)
MEAN CORPUSCULAR VOLUME: 86.9 fL (ref 77.6–95.7)
MEAN PLATELET VOLUME: 7.7 fL (ref 6.8–10.7)
MONOCYTES ABSOLUTE COUNT: 0.7 10*9/L (ref 0.3–0.8)
MONOCYTES RELATIVE PERCENT: 6.5 %
NEUTROPHILS ABSOLUTE COUNT: 8.8 10*9/L — ABNORMAL HIGH (ref 1.8–7.8)
NEUTROPHILS RELATIVE PERCENT: 83 %
NUCLEATED RED BLOOD CELLS: 0 /100{WBCs} (ref <=4)
PLATELET COUNT: 281 10*9/L (ref 150–450)
RED BLOOD CELL COUNT: 4.46 10*12/L (ref 3.95–5.13)
RED CELL DISTRIBUTION WIDTH: 17.4 % — ABNORMAL HIGH (ref 12.2–15.2)
WBC ADJUSTED: 10.7 10*9/L (ref 3.6–11.2)

## 2024-09-02 LAB — URINALYSIS WITH MICROSCOPY WITH CULTURE REFLEX PERFORMABLE
BILIRUBIN UA: NEGATIVE
BLOOD UA: NEGATIVE
GLUCOSE UA: NEGATIVE
KETONES UA: NEGATIVE
NITRITE UA: NEGATIVE
PH UA: 6 (ref 5.0–9.0)
RBC UA: 1 /HPF (ref <=4)
SPECIFIC GRAVITY UA: 1.004 (ref 1.003–1.030)
SQUAMOUS EPITHELIAL: <1 /HPF (ref 0–5)
UROBILINOGEN UA: <2
WBC UA: 18 /HPF — ABNORMAL HIGH (ref 0–5)

## 2024-09-02 LAB — LIPASE: LIPASE: 24 U/L (ref 12–53)

## 2024-09-02 LAB — MAGNESIUM: MAGNESIUM: 1.8 mg/dL (ref 1.6–2.6)

## 2024-09-02 MED ADMIN — ondansetron (ZOFRAN-ODT) disintegrating tablet 4 mg: 4 mg | ORAL | @ 18:00:00 | Stop: 2024-09-02

## 2024-09-02 MED ADMIN — fluPHENAZine decanoate (PROLIXIN) injection 50 mg: 50 mg | INTRAMUSCULAR | @ 16:00:00

## 2024-09-02 NOTE — Unmapped (Signed)
 [employer] Order #[id number] Faxed to [phone]

## 2024-09-02 NOTE — Unmapped (Signed)
 [facility]  Emergency Department Provider Note     HPI     Tammy Braun is a 83 y.o. female with past medical history of HTN, CKD stage 2, T2DM, COPD, emphysema, PVCs, CHF, CAD, chronic PE, A-Fib, bladder cancer (s/p bladder removal w/ ileal-conduit with urostomy), prior SBO involving her ileal conduit, and schizoaffective disorder who presents with abdominal pain. Patient reports abdominal pain starting this morning in the setting of nausea and emesis starting last night. Her last episode of emesis was while the patient was in the ED waiting room today. Of note, patient has had a persistent productive cough with yellow and clear sputum, for which she has been following with her PCP. She was started on a 7 day course of doxycycline  3 weeks ago (08/13/2024), and she was prescribed an albuterol  HFA 90 mcg/actuation inhaler yesterday (09/01/2024). Patient's daughter at bedside states the patient has had loose stools for the past 3 days, and that the patient's urine has been cloudy for the past week. Upon presentation, patient states her abdominal pain and nausea have resolved. Denies fevers.       Physical Exam     BP 149/69  - Pulse 66  - Temp (S) 37 ??C (98.6 ??F) (Skin)  - Resp 20  - Wt 61.2 kg (135 lb)  - SpO2 95%  - BMI 26.37 kg/m??      Constitutional: In no acute distress.  Eyes: Conjunctivae are normal. EOMI.   HEENT: Normocephalic and atraumatic. Mucous membranes are moist.   Neck: Full active range of motion  Cardiovascular: See heart rate listed above.  No murmurs appreciated.  Normal skin perfusion.   Respiratory: See respiratory rate listed above.  Speaking easily in full sentences. Basilar crackles, coarse frequent cough.  Gastrointestinal: Soft, non-distended, non-tender.  No guarding. Normal appearing ileostomy with dark yellow urine. Well healing surgical incision adjacent to the ileostomy site with no significant exudate or erythema.   Musculoskeletal: No long bone deformities. 1+ peripheral edema. Neurologic: Normal speech and language.  Extraocular movements intact without nystagmus. Pupils are equal, round, and reactive bilaterally. Cranial nerves II through XII intact. Strength 5/5 in bilateral upper and lower extremities. Distal sensation is intact in bilateral upper and lower extremities to light touch. Finger-to-nose testing normal. Negative Rhomberg. Normal, steady gait.    Skin: Skin is warm, dry and intact.  Psychiatric: Mood and affect are normal.      MDM     This patient with history as noted above presents with diarrhea, vomiting, and p.o. intolerance for the last 3 days associated with transient abdominal pain earlier today.  Due to her history of radical cystectomy status post ileal conduit complicated by SBO there is elevated concern for small bowel obstruction.  Differential also includes viral gastroenteritis, pancreatitis, diverticulitis, foodborne illness, among other etiologies.  Patient also had a recent course of antibiotics for respiratory infection which may suggest she is at risk for possible C. difficile.  Notably the antibiotic course was doxycycline  which is not strongly associated with this infection.  Patient also has significant crackles bilaterally on exam and have frequent cough concerning for pneumonia, COPD exacerbation, or viral upper respiratory infection.  Somewhat lower suspicion for ACS or heart failure exacerbation in the setting of ongoing gastrointestinal distress.  We will obtain the diagnostic workup noted below.  Initial symptomatic management with antiemetics.  Disposition per pending diagnostics and reassessment.  Please see ED course below for relevant updates.    Orders Placed This  Encounter   Procedures    Respiratory Pathogen Panel with COVID (Nasopharyngeal)    GI Pathogen Panel (instead of Stool O&P)    Clostridium difficile Assay    Urine Culture    XR Chest 2 views    XR Abdomen 1 View    CT Abdomen Pelvis Wo Contrast    CT Chest Wo Contrast    CBC w/ Differential    Comprehensive Metabolic Panel    Lipase Level    Urinalysis with Microscopy with Culture Reflex    Magnesium     Contact Isolation Status    Special Airborne/Contact Isolation Status    Enteric Isolation Status    ECG 12 Lead       ED Course as of 09/03/24 0016   Wed Sep 02, 2024   1513 CBC w/ Differential(!):    WBC 10.7   RBC 4.46   HGB 13.2   HCT 38.8   MCV 86.9   MCH 29.6   MCHC 34.1   RDW 17.4(!)   MPV 7.7   Platelet 281   nRBC 0   Neutrophils % 83.0   Lymphocytes % 9.7   Monocytes % 6.5   Eosinophils % 0.4   Basophils % 0.4   Absolute Neutrophils 8.8(!)   Absolute Lymphocytes 1.0(!)   Absolute Monocytes  0.7   Absolute Eosinophils 0.0   Absolute Basophils  0.0   Anisocytosis Slight(!)   1513 Comprehensive Metabolic Panel(!):    Sodium 133(!)   Potassium 3.6   Chloride 98   CO2 19.0(!)   Anion Gap 16(!)   Bun 15   Creatinine 1.01   BUN/Creatinine Ratio 15   eGFR CKD-EPI (2021) Female 56(!)   Glucose 162   Calcium  10.0   Albumin  3.8   Total Protein 8.3(!)   Total Bilirubin 0.5   SGOT (AST) 17   ALT 15   Alkaline Phosphatase 90   1513 Lipase Level:    Lipase 24   1610 After further discussion with the patient and her daughter at the bedside they would like to hold off on CT imaging at this time as the patient's abdominal pain has resolved.  She continues to have loose stools.  We will add additional diagnostics including x-rays of the chest and abdomen and reassess.  Patient has had prior renal failure in the setting of severe illness and contrasted CT studies which makes her reluctant to proceed with such imaging.   1625 Magnesium :    Magnesium  1.8   1638 ECG 12 Lead  Sinus rhythm with frequent PVCs, rate 67, normal axis, normal intervals, nonspecific T wave abnormalities in leads V3 and V4, no ST changes.   1743 Respiratory Pathogen Panel with COVID (Nasopharyngeal):    Adenovirus PCR Not Detected   Coronavirus HKU1 Not Detected   Coronavirus NL63 Not Detected   Coronavirus 229E Not Detected Coronavirus OC43 PCR Not Detected   Metapneumovirus Not Detected   Rhinovirus/Enterovirus Not Detected   Influenza A PCR Not Detected   Influenza B PCR Not Detected   Parainfluenza 1 Not Detected   Parainfluenza 2 Not Detected   Parainfluenza 3 Not Detected   Parainfluenza 4 Not Detected   RSV PCR Not Detected   Chlamydophila (Chlamydia) pneumoniae Not Detected   Mycoplasma pneumoniae Not Detected   SARS-CoV-2 PCR Not Detected   1743 XR Chest 2 views  IMPRESSION:  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 This limits evaluation of the lung bases. Within these limitations,  there is apparent opacities which may represent atelectasis or airspace disease.     Air-fluid levels in the partially visualized bowel is nonspecific and can be seen with diarrheal state, ileus, or obstruction. If there is clinical concern for intra-abdominal process, recommend CT abdomen pelvis     1755 XR Abdomen 1 View  IMPRESSION:  Nonspecific bowel gas pattern. If clinical concern for acute pathology in the abdomen/pelvis, recommend CT.     1800 We discussed further imaging with CT to better differentiate the patient's abdominal pain and cough.  Patient and her daughter at the bedside would not like to proceed with contrast at this time due to prior history of sepsis/contrast induced acute renal failure.   2221 CT Chest Wo Contrast  IMPRESSION:  Bibasilar atelectasis.     Large diaphragmatic defect with intrathoracic stomach, multiple loops of bowel, and proximal pancreas.     Severe intralobular emphysema.     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 Additionally, there our new locules of air within the bilateral renal pelvis sees. This is nonspecific and can be seen with recent manipulation and/or ascending infection.        Unchanged mildly dilated loops of bowel filled with fluid which can be seen with ileus/diarrheal state. These are not significantly changed from prior.     2250 Urology paged regarding free air within the renal pelvis.   Thu Sep 03, 2024   0013 Urinalysis with Microscopy with Culture Reflex(!):    Color, UA Colorless   Clarity, UA Turbid   Spec Grav, UA 1.004   pH, UA 6.0   Leukocyte Esterase, UA Large(!)   Nitrite, UA Negative   Protein, UA Trace(!)   Glucose, UA Negative   Ketones, UA Negative   Urobilinogen, UA <2.0 mg/dL   Bilirubin, UA Negative   Blood, UA Negative   RBC, UA 1   WBC, UA 18(!)   Squam Epithel, UA <1   Bacteria, UA Rare(!)   Mucus, UA Rare(!)  Not convincing for infection in the setting of ileal conduit, will follow cultures.  I discussed the evaluation with the patient and her daughter at the bedside.  They are amenable to discharge.  I have encouraged him to follow-up with her primary doctor.  Return precautions provided.  GI pathogen panel remains pending and we will contact patient with additional instructions as clinically indicated when results are available.       The case was discussed with the attending physician who is in agreement with the above assessment and plan.    - Any discussion of this patient's case/presentation between myself and consultants, admitting teams, or other team members has been documented above.  - Imaging and other studies, if performed, that were available during my care of the patient were independently reviewed and interpreted by me and considered in my medical decision making as documented above.  - External records reviewed: 07/15/2024 Cardiology Note for history.  - Consideration of admission, observation, transfer, or escalation of care: None     ED Clinical Impression     Final diagnoses:   Nausea vomiting and diarrhea (Primary)   Generalized abdominal pain        Past History     PAST MEDICAL HISTORY/PAST SURGICAL HISTORY:   Past Medical History[1]    Past Surgical History[2]    MEDICATIONS:     Current Facility-Administered Medications:     acetaminophen  (TYLENOL ) solution 325 mg, 325 mg, Oral, Once, Isaiah Lynwood Simpers,  MD    fluPHENAZine  decanoate (PROLIXIN ) injection 50 mg, 50 mg, Intramuscular, Q30 Days, , 50 mg at 09/02/24 1227    Current Outpatient Medications:     acetaminophen  (TYLENOL ) 500 MG tablet, Take 1 tablet (500 mg total) by mouth every six (6) hours., Disp: 30 tablet, Rfl: 0    albuterol  HFA 90 mcg/actuation inhaler, Inhale 2 puffs every four (4) hours as needed for wheezing., Disp: 18 g, Rfl: 10    apixaban  (ELIQUIS ) 5 mg Tab, Take 1 tablet (5 mg total) by mouth two (2) times a day., Disp: 180 tablet, Rfl: 1    blood sugar diagnostic (GLUCOSE BLOOD) Strp, Disp test strips preferred by insurance plan. Testing qday, Dx: E11.9 (Type 2 DM- controlled), Disp: 30 strip, Rfl: 11    blood-glucose meter kit, Disp. blood glucose meter kit preferred by patient's insurance. Check blood sugars as directed by provider. Dx: Diabetes, E11.9, Disp: 1 each, Rfl: 1    calcium  carbonate-vitamin D3 (CALCIUM  600 + D,3,) 600 mg-10 mcg (400 unit) per tablet, Take 1 tablet by mouth two (2) times a day., Disp: 180 tablet, Rfl: 2    diclofenac  sodium (VOLTAREN ) 1 % gel, Apply 2 grams topically four (4) times a day., Disp: 100 g, Rfl: 1    dilTIAZem  (CARDIZEM  CD) 180 MG 24 hr capsule, Take 1 capsule (180 mg total) by mouth daily., Disp: 100 capsule, Rfl: 3    doxycycline  (VIBRA -TABS) 100 MG tablet, 1 po q day for 7 days, Disp: 7 tablet, Rfl: 1    fluPHENAZine  decanoate (PROLIXIN ) 25 mg/mL injection, Inject 2 mL (50 mg total) into the muscle every thirty (30) days., Disp: 5 mL, Rfl: 11    furosemide  (LASIX ) 20 MG tablet, Take 1 tablet (20 mg total) by mouth daily., Disp: 30 tablet, Rfl: 5    gabapentin  (NEURONTIN ) 100 MG capsule, Take 1 capsule (100 mg total) by mouth Three (3) times a day as needed (pain)., Disp: 90 capsule, Rfl: 0    inhalational spacing device (E-Z SPACER) Spcr, 1 each by Miscellaneous route every four (4) hours as needed., Disp: 1 each, Rfl: 0    lancets Misc, Disp. lancets #30 or amount allowed, Testing once Qday. Dx: E11.9 (Diabetes- controlled), Disp: 30 each, Rfl: 11    MEDICAL SUPPLY ITEM, Entreal formula nutritionally supplement complete caloric density, Disp: 3 application, Rfl: 3    MEDICAL SUPPLY ITEM, Compression stockings knee high, Disp: 2 each, Rfl: 0    MEDICAL SUPPLY ITEM, Group 2 Power Wheelchair, Research scientist (life sciences) , Power Elevating Leg Rests, and Tour manager., Disp: 1 each, Rfl: 0    miscellaneous medical supply Misc, 1 each by Miscellaneous route four (4) times a day., Disp: 100 each, Rfl: 0    polyethylene glycol (GLYCOLAX ) 17 gram/dose powder, Dissolve one capful (17 grams) in 4 to 8oz of fluid and drink by mouth daily as directed., Disp: 510 g, Rfl: 2    SENNA 8.6 mg tablet, Take 2 tablets by mouth nightly., Disp: 60 tablet, Rfl: 2    underpads 2.6 X 2.9 feet Pads, 1 each by Miscellaneous route two (2) times a day., Disp: 100 each, Rfl: 3    ALLERGIES:   Lisinopril, Losartan , and Hctz [hydrochlorothiazide ]    SOCIAL HISTORY:   Social History     Tobacco Use    Smoking status: Former     Current packs/day: 0.50     Types: Cigarettes    Smokeless tobacco: Never    Tobacco comments:  10cpd, quit about 2 months ago (April 2025)   Substance Use Topics    Alcohol use: No     Alcohol/week: 0.0 standard drinks of alcohol       FAMILY HISTORY:  Family History[3]     Vitals     Vitals:    09/02/24 1322 09/02/24 1845 09/02/24 2200   BP: 132/82 (S) 165/82 149/69   Pulse: 81 (S) 71 66   Resp: 20 (S) 27 20   Temp: 36.7 ??C (98.1 ??F) (S) 37 ??C (98.6 ??F)    TempSrc: Temporal Skin    SpO2: 94% (S) 94% 95%   Weight: 61.2 kg (135 lb)           Radiology     CT Abdomen Pelvis Wo Contrast   Final Result      Bibasilar atelectasis.      Large diaphragmatic defect with intrathoracic stomach, multiple loops of bowel, and proximal pancreas.      Severe intralobular emphysema.      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 Additionally, there our new locules of air within the bilateral renal pelvis sees. This is nonspecific and can be seen with recent manipulation and/or ascending infection.         Unchanged mildly dilated loops of bowel filled with fluid which can be seen with ileus/diarrheal state. These are not significantly changed from prior.                  CT Chest Wo Contrast   Final Result      Bibasilar atelectasis.      Large diaphragmatic defect with intrathoracic stomach, multiple loops of bowel, and proximal pancreas.      Severe intralobular emphysema.      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 Additionally, there our new locules of air within the bilateral renal pelvis sees. This is nonspecific and can be seen with recent manipulation and/or ascending infection.         Unchanged mildly dilated loops of bowel filled with fluid which can be seen with ileus/diarrheal state. These are not significantly changed from prior.                  XR Abdomen 1 View   Final Result   Nonspecific bowel gas pattern. If clinical concern for acute pathology in the abdomen/pelvis, recommend CT.         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 This limits evaluation of the lung bases. Within these limitations, there is apparent opacities which may represent atelectasis or airspace disease.      Air-fluid levels in the partially visualized bowel is nonspecific and can be seen with diarrheal state, ileus, or obstruction. If there is clinical concern for intra-abdominal process, recommend CT abdomen pelvis.                 Laboratory Data     Lab Results   Component Value Date    WBC 10.7 09/02/2024    HGB 13.2 09/02/2024    HCT 38.8 09/02/2024    PLT 281 09/02/2024       Lab Results   Component Value Date    NA 133 (L) 09/02/2024    K 3.6 09/02/2024    CL 98 09/02/2024    CO2 19.0 (L) 09/02/2024    BUN 15 09/02/2024  CREATININE 1.01 09/02/2024    GLU 162 09/02/2024 CALCIUM  10.0 09/02/2024    MG 1.8 09/02/2024    PHOS 3.1 04/13/2024       Lab Results   Component Value Date    BILITOT 0.5 09/02/2024    BILIDIR 0.10 03/22/2024    PROT 8.3 (H) 09/02/2024    ALBUMIN  3.8 09/02/2024    ALT 15 09/02/2024    AST 17 09/02/2024    ALKPHOS 90 09/02/2024    GGT 16 09/17/2011       Lab Results   Component Value Date    LABPROT 11.8 08/02/2013    INR 1.06 03/04/2024    APTT 40.0 (H) 06/21/2024       Portions of this record have been created using [employer]. Dictation errors have been sought, but may not have been identified and corrected.    Documentation assistance was provided by Max Caza, Scribe on September 02, 2024 at 3:08 PM for Norleen Ernst, MD.    September 02, 2024 7:31 PM. Documentation assistance provided by the scribe. I was present during the time the encounter was recorded. The information recorded by the scribe was done at my direction and has been reviewed and validated by me.              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    Chronic kidney disease     Depression, psychotic    (CMS-HCC)     Diabetes mellitus    (CMS-HCC)     in past    Emphysema of lung    (CMS-HCC)     Financial difficulties     Frail elderly     Hearing impairment     Hernia     History of transfusion     Hyperlipidemia     Hypertension     Impaired mobility     NSTEMI (non-ST elevated myocardial infarction)    (CMS-HCC) 12/25/2023    Osteoporosis     Pulmonary emphysema    (CMS-HCC) 05/08/2015    Visual impairment    [2]   Past Surgical History:  Procedure Laterality Date    ABDOMINAL SURGERY      BLADDER SURGERY      BREAST CYST EXCISION      CHEMOTHERAPY  2012    bladder    GALLBLADDER SURGERY      stone removal    ILEOSTOMY  2012    PR COLONOSCOPY FLX DX W/COLLJ SPEC WHEN PFRMD N/A 02/09/2015    Procedure: COLONOSCOPY, FLEXIBLE, PROXIMAL TO SPLENIC FLEXURE; DIAGNOSTIC, W/WO COLLECTION SPECIMEN BY BRUSH OR WASH;  Surgeon: Eleanor CHRISTELLA Sorrel, MD;  Location: [facility] [place];  Service: Gastroenterology    PR EXPLORATORY OF ABDOMEN N/A 02/28/2024    Procedure: EXPLORATORY LAPAROTOMY, EXPLORATORY CELIOTOMY WITH OR WITHOUT BIOPSY(S);  Surgeon: Hollice Toribio Birmingham, MD;  Location: [facility] OR [facility];  Service: General Surgery    PR RECONSTR TOTAL SHOULDER IMPLANT Left 04/08/2018    Procedure: ARTHROPLASTY, GLENOHUMERAL JOINT; TOTAL SHOULDER(GLENOID & PROXIMAL HUMERAL REPLACEMENT(EG, TOTAL SHOULDER);  Surgeon: Alease Carrie Oyster, MD;  Location: [facility] OR [facility];  Service: Ortho Sports Medicine    PR REOPEN RECENT ABD EXPLORATORY Midline  03/01/2024    Procedure: REOPENING OF RECENT LAPAROTOMY;  Surgeon: Vicci Janas CROME., MD;  Location: OR [facility];  Service: Trauma    PR REOPEN RECENT ABD EXPLORATORY N/A 03/03/2024    Procedure: REOPENING OF RECENT LAPAROTOMY;  Surgeon: Quinn Tinnie Verla Dasie, MD;  Location: OR [facility];  Service: Trauma    PR SIGMOIDOSCOPY,BIOPSY N/A 03/11/2015    Procedure: SIGMOIDOSCOPY, FLEXIBLE; WITH BIOPSY, SINGLE OR MULTIPLE;  Surgeon: Sandrea CHRISTELLA Mood, MD;  [facility];  Service: Gastroenterology    PR XCAPSL CTRC RMVL INSJ IO LENS PROSTH W/O ECP Left 06/05/2022    Procedure: EXTRACAPSULAR CATARACT REMOVAL W/INSERTION OF INTRAOCULAR LENS PROSTHESIS, MANUAL OR MECHANICAL TECHNIQUE WITHOUT ENDOSCOPIC CYCLOPHOTOCOAGULATION;  Surgeon: Sinthu Sivarama Ranjan, MD;  Location: [facility] OR [facility];  Service: Ophthalmology    PR XCAPSL CTRC RMVL INSJ IO LENS PROSTH W/O ECP Right 06/19/2022    Procedure: EXTRACAPSULAR CATARACT REMOVAL W/INSERTION OF INTRAOCULAR LENS PROSTHESIS, MANUAL OR MECHANICAL TECHNIQUE WITHOUT ENDOSCOPIC CYCLOPHOTOCOAGULATION;  Surgeon: Sinthu Sivarama Ranjan, MD;  Location: [facility] OR [facility];  Service: Ophthalmology   [3]   Family History  Problem Relation Age of Onset Breast cancer Daughter 57    Diabetes Mother     Glaucoma Father     Colon cancer Neg Hx     Ovarian cancer Neg Hx     Endometrial cancer Neg Hx     Anesthesia problems Neg Hx     Bleeding Disorder Neg Hx         Carlo Norleen LABOR, MD  Resident  09/03/24 [phone]

## 2024-09-02 NOTE — Unmapped (Signed)
 Presents with N/V/D and generalized abd pain beginning today.

## 2024-09-02 NOTE — Unmapped (Signed)
 Administered long-acting injectable today. See MAR for additional documentation. Sharyl Nimrod, CMA

## 2024-09-02 NOTE — Unmapped (Signed)
 Copied from CRM [id number]. Topic: Access To Clinicians - Order Question  >> Sep 02, 2024  2:32 PM Bria J wrote:  The PAC has received an incoming clinical call:    Caller name: Alisa- [employer]   Best callback number: [phone]  Relationship to Patient: other  Describe the reason for the call: [name] is calling to get an update on a fax for [employer] that was sent on 08/19/24 and 08/26/24 but there is nothing showing and verified fax number as well.

## 2024-09-03 NOTE — Unmapped (Signed)
 Copied from CRM #[id number]. Topic: Access To Clinicians - Req Clinic Call Back  >> Sep 03, 2024 12:34 PM Santana RAMAN wrote:  Reason of the Call: Kate from [place] called regard patient heart rate    Requesting: This is just Fyi for PCP    Supporting details:Patient heart rate at rest on 9/4 was 55 beats per min.  On 9/2 and 9/3 patient was vomiting, diarrhea.  Patient was taken to ER on 9/3 and was Dx with stomach bug.  Kate just wanted to provide PCP with information    Appointment: The patient was last seen on 08/13/2024.      Kate Waymon) preferred contact: Cell Phone Telephone Information:  Mobile          [phone]    Routine callback turnaround time: 72 business hours. (Caller Notified) No call back requested unless have questions or concerns

## 2024-09-07 NOTE — Unmapped (Signed)
 Spoke with the patient's daughter. She stated they have already called and scheduled an appointment with that clinic.

## 2024-09-07 NOTE — Unmapped (Signed)
 Copied from CRM #[id number]. Topic: Other - Other  >> Sep 07, 2024  9:33 AM Randall BROCKS wrote:  Reason of the Call: referral    Requesting: Patient states she is having shoulder pain.    Supporting details:Patient would like to see the surgeon who operated 4 years ago on her shoulder.    Appointment: none yet.      The patient preferred contact: Home Phone [phone] (home)  Routine callback turnaround time: 24-48 business hours. Programmer, systems Notified)    Jcano  09/07/24

## 2024-09-07 NOTE — Unmapped (Signed)
 Copied from CRM #[id number]. Topic: Other - Other  >> Sep 07, 2024 12:27 PM Hargis HERO wrote:  Appointment Related: Reason of the Call: To inform PCP that patient has missed visits for OT    If any question call Andrea Mose from [facility] at : [phone]

## 2024-09-09 NOTE — Unmapped (Signed)
 Copied from CRM #[id number]. Topic: Other - Other  >> Sep 09, 2024  9:31 AM Hargis HERO wrote:  Fax received confirmation: I was able to provide confirmation that the [employer] paperwork document was received by facility however the paperwork needs a wet signature/hand signature from provider- Elisa asking to resend paperwork with request    Any question [phone]  Fax [phone]

## 2024-09-09 NOTE — Unmapped (Signed)
 ASSESSMENT AND PLAN:  Tammy Braun is a 83 y.o. female with parastomal pain status post neoadjuvant chemotherapy and radical cystectomy with Ileal Conduit on 10/05/2011 for T2N0M0 bladder cancer.    Given that she is over 5 years out from her cystectomy, she does not require further oncologic follow up. She has a large parastomal hernia with significant prolapse - and this required urgent repair. She is still bothered by this and daughter requests moving up this appointment- I'll send my note to Dr. Mazolla so he can move up the appointment in possible.    PLAN:  F/up 6 months with RUS with onc APP  Will request moving up the visit with Dr. Mazolla to discuss hernia.    It was a pleasure seeing this delightful woman in my clinic today.      REASON FOR VISIT:   Follow-up status post cystectomy - and recent SBO and parastomal hernia    HISTORY OF PRESENT ILLNESS:  Tammy Braun is a 83 y.o. 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      We have seen her for many years. She has a known stomal/parastomal hernia which has been managed conservatively.  She no longer needs oncologic surveillance and is followed annually with renal ultrasound and laboratory studies. She was last seen in my clinic in 01/16/2023.      05/2020             CT scan - no evidence of disease, stable parastomal hernia, mild R hydroureter (consistent with known reflux with conduit).   10/03/2021 RUS - no hydro, bilateral nephrolithiasis, bilateral cysts   05/10/2022 ED visit for pyelonephritis; CT showed R hydroureter with urothelial enhancement suggestive of pyelitis, new left mild hydro 11/08/22          Loopogram - No filling defects or stricturing. Normal reflux bilaterally   12/25/2023 CTAP non con with BL renal masses, indeterminate   01/24/2024 MRI abdomen- multiple cysts, no enhancing mass in kidneys   03/01/2024 SBO- underwent parastomal hernia repair      She returns today and reports that she is doing well. Notes some irritation around the stomal site as well as white spots around the stoma. Daughter reports fluctuating output from urostomy and has noted urine is darker with a lot of mucous lately. Denies systemic infectious symptoms including AMS, fever, chills, flank pain, hematuria.  Past medical, surgical, family, and social history were reviewed and remain unchanged.     Past medical, surgical, family, and social history were reviewed and remain unchanged.    MEDICATIONS:   Current Outpatient Medications   Medication Sig Dispense Refill    acetaminophen  (TYLENOL ) 500 MG tablet Take 1 tablet (500 mg total) by mouth every six (6) hours. 30 tablet 0    albuterol  HFA 90 mcg/actuation inhaler Inhale 2 puffs every four (4) hours as needed for wheezing. 18 g 10    apixaban  (ELIQUIS ) 5 mg Tab Take 1 tablet (5 mg total) by mouth two (2) times a day. 180 tablet 1    blood sugar diagnostic (GLUCOSE BLOOD) Strp Disp test strips preferred by insurance plan. Testing qday, Dx: E11.9 (Type 2 DM- controlled) 30 strip 11    blood-glucose meter kit Disp. blood glucose meter kit preferred by patient's insurance. Check blood sugars as directed by provider. Dx: Diabetes, E11.9 1 each  1    calcium  carbonate-vitamin D3 (CALCIUM  600 + D,3,) 600 mg-10 mcg (400 unit) per tablet Take 1 tablet by mouth two (2) times a day. 180 tablet 2    diclofenac  sodium (VOLTAREN ) 1 % gel Apply 2 grams topically four (4) times a day. 100 g 1    dilTIAZem  (CARDIZEM  CD) 180 MG 24 hr capsule Take 1 capsule (180 mg total) by mouth daily. 100 capsule 3    doxycycline  (VIBRA -TABS) 100 MG tablet 1 po q day for 7 days 7 tablet 1    fluPHENAZine  decanoate (PROLIXIN ) 25 mg/mL injection Inject 2 mL (50 mg total) into the muscle every thirty (30) days. 5 mL 11    furosemide  (LASIX ) 20 MG tablet Take 1 tablet (20 mg total) by mouth daily. 30 tablet 5    gabapentin  (NEURONTIN ) 100 MG capsule Take 1 capsule (100 mg total) by mouth Three (3) times a day as needed (pain). 90 capsule 0    inhalational spacing device (E-Z SPACER) Spcr 1 each by Miscellaneous route every four (4) hours as needed. 1 each 0    lancets Misc Disp. lancets #30 or amount allowed, Testing once Qday. Dx: E11.9 (Diabetes- controlled) 30 each 11    MEDICAL SUPPLY ITEM Entreal formula nutritionally supplement complete caloric density 3 application 3    MEDICAL SUPPLY ITEM Compression stockings knee high 2 each 0    MEDICAL SUPPLY ITEM Group 2 Power Wheelchair, Research scientist (life sciences) , Power Elevating Leg Rests, and Tour manager. 1 each 0    miscellaneous medical supply Misc 1 each by Miscellaneous route four (4) times a day. 100 each 0    polyethylene glycol (GLYCOLAX ) 17 gram/dose powder Dissolve one capful (17 grams) in 4 to 8oz of fluid and drink by mouth daily as directed. 510 g 2    SENNA 8.6 mg tablet Take 2 tablets by mouth nightly. 60 tablet 2    underpads 2.6 X 2.9 feet Pads 1 each by Miscellaneous route two (2) times a day. 100 each 3     Current Facility-Administered Medications   Medication Dose Route Frequency Provider Last Rate Last Admin    acetaminophen  (TYLENOL ) solution 325 mg  325 mg Oral Once Isaiah Lynwood Simpers, MD        fluPHENAZine  decanoate (PROLIXIN ) injection 50 mg  50 mg Intramuscular Q30 Days    50 mg at 09/02/24 1227     ALLERGIES:    Allergies   Allergen Reactions    Lisinopril Anaphylaxis and Swelling    Losartan  Dizziness     Other Reaction(s): Dizziness    Hctz [Hydrochlorothiazide ]      SIADH     ROS:   Negative upon 10-system review other than what is mentioned in the HPI.     PHYSICAL EXAMINATION:   GENERAL: Pleasant female in no acute distress.   VITAL SIGNS: not currently breastfeeding.  PULMONARY: Normal work of breathing, no use of accessory muscles  ABDOMEN: Soft, non-tender, non-distended.  No organomegaly. Soft reducible peristomal hernia noted. Incision sites are clean, dry, intact. Stoma is prolapsed, but pink with clear urine in bag. Slightly whitish tinge to skin around stoma- normal appearing  PSYCHOLOGIC: Normal affect, normal mood    IMAGING:  ECG 12 Lead  SINUS RHYTHM WITH FREQUENT PREMATURE VENTRICULAR BEATS  CANNOT RULE OUT ANTERIOR INFARCT  (CITED ON OR BEFORE 20-Jun-2024)  ABNORMAL ECG  WHEN COMPARED WITH ECG OF 20-Jun-2024 18:05,  NONSPECIFIC T WAVE ABNORMALITY NOW EVIDENT IN ANTEROLATERAL LEADS  Confirmed by Antonetta Gull [phone]) on 09/05/2024 7:23:42 AM    LABS:  Admission on 09/02/2024, Discharged on 09/03/2024   Component Date Value Ref Range Status    Sodium 09/02/2024 133 (L)  135 - 145 mmol/L Final    Potassium 09/02/2024 3.6  3.4 - 4.8 mmol/L Final    Chloride 09/02/2024 98  98 - 107 mmol/L Final    CO2 09/02/2024 19.0 (L)  20.0 - 31.0 mmol/L Final    Anion Gap 09/02/2024 16 (H)  5 - 14 mmol/L Final    BUN 09/02/2024 15  9 - 23 mg/dL Final    Creatinine 90/96/7974 1.01  0.55 - 1.02 mg/dL Final    BUN/Creatinine Ratio 09/02/2024 15   Final    eGFR CKD-EPI (2021) Female 09/02/2024 56 (L)  >=60 mL/min/1.64m2 Final    eGFR calculated with CKD-EPI 2021 equation in accordance with [employer] and [employer] of Nephrology Task Force recommendations.    Glucose 09/02/2024 162  70 - 179 mg/dL Final    Calcium  09/02/2024 10.0  8.7 - 10.4 mg/dL Final    Albumin  09/02/2024 3.8  3.4 - 5.0 g/dL Final    Total Protein 09/02/2024 8.3 (H)  5.7 - 8.2 g/dL Final    Total Bilirubin 09/02/2024 0.5  0.3 - 1.2 mg/dL Final    AST 90/96/7974 17  <=34 U/L Final    ALT 09/02/2024 15  10 - 49 U/L Final    Alkaline Phosphatase 09/02/2024 90  46 - 116 U/L Final    Lipase 09/02/2024 24  12 - 53 U/L Final    WBC 09/02/2024 10.7  3.6 - 11.2 10*9/L Final    RBC 09/02/2024 4.46  3.95 - 5.13 10*12/L Final    HGB 09/02/2024 13.2  11.3 - 14.9 g/dL Final    HCT 90/96/7974 38.8  34.0 - 44.0 % Final    MCV 09/02/2024 86.9  77.6 - 95.7 fL Final MCH 09/02/2024 29.6  25.9 - 32.4 pg Final    MCHC 09/02/2024 34.1  32.0 - 36.0 g/dL Final    RDW 90/96/7974 17.4 (H)  12.2 - 15.2 % Final    MPV 09/02/2024 7.7  6.8 - 10.7 fL Final    Platelet 09/02/2024 281  150 - 450 10*9/L Final    nRBC 09/02/2024 0  <=4 /100 WBCs Final    Neutrophils % 09/02/2024 83.0  % Final    Lymphocytes % 09/02/2024 9.7  % Final    Monocytes % 09/02/2024 6.5  % Final    Eosinophils % 09/02/2024 0.4  % Final    Basophils % 09/02/2024 0.4  % Final    Absolute Neutrophils 09/02/2024 8.8 (H)  1.8 - 7.8 10*9/L Final    Absolute Lymphocytes 09/02/2024 1.0 (L)  1.1 - 3.6 10*9/L Final    Absolute Monocytes 09/02/2024 0.7  0.3 - 0.8 10*9/L Final    Absolute Eosinophils 09/02/2024 0.0  0.0 - 0.5 10*9/L Final    Absolute Basophils 09/02/2024 0.0  0.0 - 0.1 10*9/L Final    Anisocytosis 09/02/2024 Slight (A)  Not Present Final    Color, UA 09/02/2024 Colorless   Final    Clarity, UA 09/02/2024 Turbid   Final    Specific Gravity, UA 09/02/2024 1.004  1.003 - 1.030 Final    pH, UA 09/02/2024 6.0  5.0 - 9.0 Final    Leukocyte Esterase, UA 09/02/2024 Large (A)  Negative Final    Nitrite, UA 09/02/2024 Negative  Negative Final  Protein, UA 09/02/2024 Trace (A)  Negative Final    Glucose, UA 09/02/2024 Negative  Negative Final    Ketones, UA 09/02/2024 Negative  Negative Final    Urobilinogen, UA 09/02/2024 <2.0 mg/dL  <7.9 mg/dL Final    Bilirubin, UA 09/02/2024 Negative  Negative Final    Blood, UA 09/02/2024 Negative  Negative Final    RBC, UA 09/02/2024 1  <=4 /HPF Final    WBC, UA 09/02/2024 18 (H)  0 - 5 /HPF Final    Squam Epithel, UA 09/02/2024 <1  0 - 5 /HPF Final    Bacteria, UA 09/02/2024 Rare (A)  None Seen /HPF Final    Mucus, UA 09/02/2024 Rare (A)  None Seen /HPF Final    Magnesium  09/02/2024 1.8  1.6 - 2.6 mg/dL Final    Adenovirus 90/96/7974 Not Detected  Not Detected Final    Coronavirus HKU1 09/02/2024 Not Detected  Not Detected Final    Coronavirus NL63 09/02/2024 Not Detected  Not Detected Final    Coronavirus 229E 09/02/2024 Not Detected  Not Detected Final    Coronavirus OC43 PCR 09/02/2024 Not Detected  Not Detected Final    Metapneumovirus 09/02/2024 Not Detected  Not Detected Final    Rhinovirus/Enterovirus 09/02/2024 Not Detected  Not Detected Final    Influenza A 09/02/2024 Not Detected  Not Detected Final    Influenza B 09/02/2024 Not Detected  Not Detected Final    Parainfluenza 1 09/02/2024 Not Detected  Not Detected Final    Parainfluenza 2 09/02/2024 Not Detected  Not Detected Final    Parainfluenza 3 09/02/2024 Not Detected  Not Detected Final    Parainfluenza 4 09/02/2024 Not Detected  Not Detected Final    RSV 09/02/2024 Not Detected  Not Detected Final    Chlamydophila (Chlamydia) pneumoni* 09/02/2024 Not Detected  Not Detected Final    Mycoplasma pneumoniae 09/02/2024 Not Detected  Not Detected Final    SARS-CoV-2 PCR 09/02/2024 Not Detected  Not Detected Final    EKG Ventricular Rate 09/02/2024 67  BPM Final    EKG Atrial Rate 09/02/2024 67  BPM Final    EKG P-R Interval 09/02/2024 140  ms Final    EKG QRS Duration 09/02/2024 82  ms Final    EKG Q-T Interval 09/02/2024 422  ms Final    EKG QTC Calculation 09/02/2024 445  ms Final    EKG Calculated P Axis 09/02/2024 81  degrees Final    EKG Calculated R Axis 09/02/2024 80  degrees Final    EKG Calculated T Axis 09/02/2024 104  degrees Final    QTC Fredericia 09/02/2024 438  ms Final    Adenovirus F 40/41 PCR 09/02/2024 Not Detected  Not Detected Final    Astrovirus PCR 09/02/2024 Not Detected  Not Detected Final    Norovirus GI/GII PCR 09/02/2024 Not Detected  Not Detected Final    Rotavirus A PCR 09/02/2024 Not Detected  Not Detected Final    Campylobacter PCR 09/02/2024 Not Detected  Not Detected Final    E. coli O157 09/02/2024 Not Performed  Not Detected, Not Performed Final    Enteropathogenic E. coli (EPEC) PCR 09/02/2024 Not Detected  Not Detected, Not Performed Final    Enterotoxigenic E. coli (ETEC) PCR 09/02/2024 Not Detected  Not Detected Final    Plesiomonas shigelloides PCR 09/02/2024 Not Detected  Not Detected Final    Salmonella PCR 09/02/2024 Not Detected  Not Detected Final    Shiga-like Toxin-Producing E. coli* 09/02/2024 Not Detected  Not Detected  Final    Shigella/ Enteroinvasive E. coli (* 09/02/2024 Not Detected  Not Detected Final    Yersinia enterocolitica PCR 09/02/2024 Not Detected  Not Detected Final    Cryptosporidium PCR 09/02/2024 Not Detected  Not Detected Final    Cyclospora cayetanensis PCR 09/02/2024 Not Detected  Not Detected Final    Entamoeba histolytica PCR 09/02/2024 Not Detected  Not Detected Final    Giardia lamblia PCR 09/02/2024 Not Detected  Not Detected Final    C. difficile Toxin Confirmatory 09/02/2024 Negative  Negative Final    Clostridium difficile NOT detected    Urine Culture, Comprehensive 09/02/2024 Mixed Gram Positive/Gram Negative Organisms Isolated (A)   Final

## 2024-09-15 ENCOUNTER — Inpatient Hospital Stay: Admit: 2024-09-15 | Discharge: 2024-09-16 | Payer: MEDICARE

## 2024-09-15 ENCOUNTER — Ambulatory Visit: Admit: 2024-09-15 | Discharge: 2024-09-16 | Payer: MEDICARE

## 2024-09-15 DIAGNOSIS — Z96612 Presence of left artificial shoulder joint: Principal | ICD-10-CM

## 2024-09-15 MED ORDER — DULOXETINE 20 MG CAPSULE,DELAYED RELEASE
ORAL_CAPSULE | Freq: Every day | ORAL | 2 refills | 30.00000 days | Status: CP
Start: 2024-09-15 — End: 2024-12-14

## 2024-09-15 NOTE — Unmapped (Signed)
 [facility] Clinic Administered Medication Refill Coordination Note      NAME:Tammy Braun DOB: 08-Nov-1941      Medication: fluphenazine  decanoate    Day Supply: 30 days       SHIPPING      Next delivery from [facility] [phone]) to [facility] for Tammy Braun is scheduled for 09/24    Clinic contact: [name]     Patient's next nurse visit for administration: 10/01    We will follow up with clinic monthly for standard refill processing and delivery.      Abisola Carrero LITTIE Hope  [facility]

## 2024-09-15 NOTE — Unmapped (Signed)
 SPORTS MEDICINE RETURN VISIT    ASSESSMENT      Tammy Braun is a 83 y.o. female  with:  1. Chronic left shoulder pain s/p rTSA 2019    PLAN:    --Tammy Braun continues to complain of left shoulder pain.  This has been ongoing for the last several years though she had a good early response to surgery.  No obvious adverse features are appreciated on x-ray today other than some mild scapular notching.  We discussed revision, though given a clear lack of etiology of pain, and her age, I would worry that the complication rate exceeds the potential benefit.  I have suggested we try oral pain management again as she failed to pick up our last recommendation of a trial of Cymbalta .  I have had some reasonable success using Cymbalta  in patients with chronic shoulder pain after surgery and think this can concomitantly benefit her peripheral neuropathic symptoms.  We are going to try this today.  She may return to clinic on an as-needed basis      --Imaging findings, further diagnostic and therapeutic options were reviewed with the patient, along with the benefits and drawbacks of each, and the patient expressed understanding    Prescriptions today: Cymbalta  20mg  daily    Follow-up: PRN    X-rays at next visit:  None    Tammy Fothergill, MD  PGY-5  Sports Medicine Fellow     SUBJECTIVE       History of Present Illness: 83 y.o. female who presents for chronic left shoulder pain.  This has been ongoing for the last several years though she had a good early response to surgery.  No obvious adverse features are appreciated on x-ray when last seen 10/2023 other than some mild scapular notching.  We discussed revision, though given a clear lack of etiology of pain, and her age, I would worry that the complication rate exceeds the potential benefit.  We have suggested we try oral pain management.  I have had some reasonable success using Cymbalta  in patients with chronic shoulder pain after surgery.  She was instructed to return to clinic PRN.     Patient represents for chronic left shoulder pain and neuropathic symptoms feet/shins, hands. She did not take or pick up the cymbalta  medication.    Attending Attestation:    I performed a history and physical examination of the patient and   discussed the patient's management with the Resident. I reviewed   the Resident's note and agree with the documented findings and plan   of care.     Tammy LULLA Oyster, MD      SUBJECTIVE:  Chief Complaint:   chronic bilateral shoulder pain      Medical History  Past Medical History[1] Surgical History  Past Surgical History[2] Medications  has a current medication list which includes the following prescription(s): acetaminophen , albuterol , apixaban , glucose blood, blood-glucose meter, calcium  carbonate-vitamin d3, diclofenac  sodium, diltiazem , doxycycline , fluphenazine  decanoate, furosemide , gabapentin , inhalational spacing device, lancets, MEDICAL SUPPLY ITEM, MEDICAL SUPPLY ITEM, MEDICAL SUPPLY ITEM, miscellaneous medical supply, polyethylene glycol, senna, and underpads, and the following Facility-Administered Medications: acetaminophen  and fluphenazine  decanoate.   Tobacco/Alcohol History  Tobacco Use History[3]  Social History     Substance and Sexual Activity   Alcohol Use No    Alcohol/week: 0.0 standard drinks of alcohol    Social History        Occupational History    Not on file  Home Address:  [address] [place] [address] Family History  Family History[4]     Allergies   Lisinopril, Losartan , and Hctz [hydrochlorothiazide ]       Review of Systems  .   Tammy Braun  No chest pain, dyspnea, nausea, fevers, chills         OBJECTIVE     Physical Exam:    DETAILED PHYSICAL EXAM  General Appearance well-nourished and no acute distress   Vitals Estimated body mass index is 26.37 kg/m?? as calculated from the following:    Height as of 08/13/24: 152.4 cm (5').    Weight as of 09/02/24: 61.2 kg (135 lb).   Mood and Affect alert, cooperative, and pleasant Cardiovascular well-perfused distally and no swelling         MUSCULOSKELETAL   LEFT:      SHOULDER:   Inspection: No swelling, erythema, deformity, atrophy or hypertrophy.  Range of motion: FE 120, ER 60, IR testing deferred due to limitations, Abduction 70   Apprehension: n/a  Provocative Tests / Tenderness:  tenderness deep left shoulder          Imaging/other tests: X-rays of the left shoulder today reveal a stable Grammont style reverse arthroplasty prosthesis in unchanged position.  There is some mild scapular notching noted inferiorly without compromise of the baseplate.      Orthopaedic PROMIS:  Failed to redirect to the Timeline version of the REVFS SmartLink.        02/13/2022 07/24/2022 10/29/2023   [facility]   Upper Extremity Physical Function CAT Score 7 5 7    Pain Interference CAT Score 40 28 40       PRO Status:  Patient completed PRO.      ADMINISTRATIVE     I have personally reviewed and interpreted the images (as available).  I have personally reviewed prior records and incorporated relevant information above (as available).    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    Chronic kidney disease     Depression, psychotic    (CMS-HCC)     Diabetes mellitus    (CMS-HCC)     in past    Emphysema of lung    (CMS-HCC)     Financial difficulties     Frail elderly     Hearing impairment     Hernia     History of transfusion     Hyperlipidemia     Hypertension     Impaired mobility     NSTEMI (non-ST elevated myocardial infarction)    (CMS-HCC) 12/25/2023    Osteoporosis     Pulmonary emphysema    (CMS-HCC) 05/08/2015    Visual impairment    [2] Past Surgical History:  Procedure Laterality Date    ABDOMINAL SURGERY  BLADDER SURGERY      BREAST CYST EXCISION      CHEMOTHERAPY  2012    bladder    GALLBLADDER SURGERY      stone removal    ILEOSTOMY  2012    PR COLONOSCOPY FLX DX W/COLLJ SPEC WHEN PFRMD N/A 02/09/2015    Procedure: COLONOSCOPY, FLEXIBLE, PROXIMAL TO SPLENIC FLEXURE; DIAGNOSTIC, W/WO COLLECTION SPECIMEN BY BRUSH OR WASH;  Surgeon: Eleanor CHRISTELLA Sorrel, MD;  Location: [facility] [place];  Service: Gastroenterology    PR EXPLORATORY OF ABDOMEN N/A 02/28/2024    Procedure: EXPLORATORY LAPAROTOMY, EXPLORATORY CELIOTOMY WITH OR WITHOUT BIOPSY(S);  Surgeon: Hollice Toribio Birmingham, MD;  [facility];  Service: General Surgery    PR RECONSTR TOTAL SHOULDER IMPLANT Left 04/08/2018    Procedure: ARTHROPLASTY, GLENOHUMERAL JOINT; TOTAL SHOULDER(GLENOID & PROXIMAL HUMERAL REPLACEMENT(EG, TOTAL SHOULDER);  Surgeon: Tammy Carrie Oyster, MD;  [facility];  Service: Ortho Sports Medicine    PR REOPEN RECENT ABD EXPLORATORY Midline 03/01/2024    Procedure: REOPENING OF RECENT LAPAROTOMY;  Surgeon: Vicci Janas CROME., MD;  Location: OR [facility];  Service: Trauma    PR REOPEN RECENT ABD EXPLORATORY N/A 03/03/2024    Procedure: REOPENING OF RECENT LAPAROTOMY;  Surgeon: Quinn Tinnie Verla Dasie, MD;  Location: OR [facility];  Service: Trauma    PR SIGMOIDOSCOPY,BIOPSY N/A 03/11/2015    Procedure: KINGSTON SIDE; WITH BIOPSY, SINGLE OR MULTIPLE;  Surgeon: Sandrea CHRISTELLA Mood, MD;  [facility];  Service: Gastroenterology    PR XCAPSL CTRC RMVL INSJ IO LENS PROSTH W/O ECP Left 06/05/2022    Procedure: EXTRACAPSULAR CATARACT REMOVAL W/INSERTION OF INTRAOCULAR LENS PROSTHESIS, MANUAL OR MECHANICAL TECHNIQUE WITHOUT ENDOSCOPIC CYCLOPHOTOCOAGULATION;  Surgeon: Sinthu Sivarama Ranjan, MD;  Location: [facility] OR [facility];  Service: Ophthalmology    PR XCAPSL CTRC RMVL INSJ IO LENS PROSTH W/O ECP Right 06/19/2022    Procedure: EXTRACAPSULAR CATARACT REMOVAL W/INSERTION OF INTRAOCULAR LENS PROSTHESIS, MANUAL OR MECHANICAL TECHNIQUE WITHOUT ENDOSCOPIC CYCLOPHOTOCOAGULATION;  Surgeon: Sinthu Sivarama Ranjan, MD;  Location: [facility] OR [facility];  Service: Ophthalmology   [3]   Social History  Tobacco Use   Smoking Status Former    Current packs/day: 0.50    Types: Cigarettes   Smokeless Tobacco Never   Tobacco Comments    10cpd, quit about 2 months ago (April 2025)   [4]   Family History  Problem Relation Age of Onset    Breast cancer Daughter 67    Diabetes Mother     Glaucoma Father     Colon cancer Neg Hx     Ovarian cancer Neg Hx     Endometrial cancer Neg Hx     Anesthesia problems Neg Hx     Bleeding Disorder Neg Hx

## 2024-09-16 ENCOUNTER — Ambulatory Visit: Admit: 2024-09-16 | Discharge: 2024-09-17 | Payer: MEDICARE

## 2024-09-16 DIAGNOSIS — C679 Malignant neoplasm of bladder, unspecified: Principal | ICD-10-CM

## 2024-09-16 NOTE — Unmapped (Signed)
 [employer] order faxed [phone]

## 2024-09-17 NOTE — Unmapped (Signed)
 You have a fax in your box requiring your signature.

## 2024-09-17 NOTE — Unmapped (Signed)
 Copied from CRM #[id number]. Topic: Access To Clinicians - Req Clinic Call Back  >> Sep 17, 2024  8:29 AM Mykala A wrote:  [facility] called to report patient's pain level for her shoulder area is a 7

## 2024-09-18 NOTE — Unmapped (Signed)
 [employer] order [id number] faxed to [phone]

## 2024-09-22 MED FILL — FLUPHENAZINE DECANOATE 25 MG/ML INJECTION SOLUTION: INTRAMUSCULAR | 30 days supply | Qty: 5 | Fill #8

## 2024-09-22 NOTE — Unmapped (Signed)
 Hi Dr. Elige,    There is a form in your box requiring your signature.    Thank you,  Massie

## 2024-09-22 NOTE — Unmapped (Signed)
 Upcoming Appt:  Future Appointments   Date Time Provider Department Center   09/24/2024 11:20 AM Elige [name], MD [facility]   09/25/2024 10:45 AM Maryfrances Recardo RAMAN, MD [facility]   09/30/2024 11:00 AM [place] [facility] NURSE [facility]   10/28/2024 12:30 PM Claudene Cesar HERO, MD [facility]   01/14/2025 11:15 AM Mazzola Poli de Figueiredo, Sergio, MD [facility]   01/27/2025 11:15 AM Barnet, Donnice Righter, MD [facility]   03/16/2025  1:00 PM IC US  RM 1 I[facility] [place] - IC   03/16/2025  1:45 PM Dunn, Ronal Speed, ANP UN[facility]       Disposition:  Go to ED Now    Encounter Reason for Disposition:    [1] Age > 40 AND [2] associated chest or jaw pain AND [3] pain lasts > 5 minutes      Is this a pediatric patient?   No     Any recent, relevant visit?   09/15/24 saw ortho    Any relevant medical history?   Yes     What history?  Neuropathy  CHF  CKD  COPD  diabetic  Any interventions?   Tylenol  500mg  taken at 9am     What has been done? (include related medication given, dose, and date/time)  Gabapentin  not 09/22/24  Cymbalta -not taken yet    [facility] Eligibility    Patient is eligible for [facility] (according to most recent zip code and insurance information)?  No    Patient is [facility] eligible and appropriate for referral: No     Feet and arm pain 8/10. Left arm worse. Neck  and jaw pain as well. Kate, PT assistant with patient. She is telling family that pt needs ED evaluation now. Pt and daughter will call 911 for transport. They will take her to [facility] ED.     Encounter Initial Assessment:  1. ONSET: When did the pain start?      Ongoing 8/10 09/22/24 complaining more  2. LOCATION: Where is the pain located?      Forearm to shoulder with numbness  3. PAIN: How bad is the pain? (Scale 0-10; or none, mild, moderate, severe)      8/10 pain level  4. WORK OR EXERCISE: Has there been any recent work or exercise that involved this part of the body?      Denies, OT visit   5. CAUSE: What do you think is causing the arm pain?      Unsure, ongoing pain/neuropathy  6. OTHER SYMPTOMS: Do you have any other symptoms? (e.g., neck pain, swelling, rash, fever, numbness, weakness)      Jaw pain at times, numbness, neck pain on left side, afebrile  7. PREGNANCY: Is there any chance you are pregnant? When was your last menstrual period?      N/a      Encounter Protocols Used:  Arm Pain-A-AH

## 2024-09-24 MED ORDER — GABAPENTIN 100 MG CAPSULE
ORAL_CAPSULE | Freq: Three times a day (TID) | ORAL | 0 refills | 30.00000 days | Status: CP | PRN
Start: 2024-09-24 — End: 2024-12-23

## 2024-09-24 NOTE — Unmapped (Signed)
 [facility]  Psychiatry   Established Patient E&M Service - Outpatient       Assessment:    Tammy Braun presents for follow-up evaluation. At her last visit on 06/26/24 with Dr. Delores, she was psychiatrically stable and no medication changes were made.     Today, patient reports that she has had lower mood in the last two months related to multiple medical issues and conflict with her daughter. She denies SI or worsening of her AH and feels safe in her current living situation. She describes her mood and pain as having improved since starting cymbalta  (prescribed by orthopedics). Discussed that her cymbalta  dose could be increased in the future to help further with mood; however, she is not currently interested in any medication changes. She would like a referral for therapy.    Patient reports understanding and agreement with plan to continue current medication regimen and follow up in 3 months. No acute safety concerns based on today's visit. RTC in 3 months.     Identifying Information:  Tammy Braun is a 83 y.o. female with a history of schizoaffective disorder. First presented to STEP clinic September 2021. She has been stable on prolixin  50 mg q30 days for the past 30 years. Per PCP, patient has had worsening of depressive symptoms, though patient did not endorse. Reports auditory hallucinations and denies any CAH. Reports some anxiety related to impatience waiting on others, but finds this manageable. Patient is not interested in any new medications, but could consider increasing the frequency of prolixin  administration if needed. March 2022, augmenting with mirtazapine  for low mood and sleep. Could also consider augmenting with SRNI or wellbutrin for depression. In September 2025, her orthopedic provider started her on cymbalta  20mg  daily for shoulder pain which she described as also helping with low mood.    Risk Assessment:   An assessment of suicide and violence risk factors was performed as part of this evaluation and is not significantly increased from the last visit.   While future psychiatric events cannot be accurately predicted, the patient does not currently require acute inpatient psychiatric care and does not currently meet [place]  involuntary commitment criteria.      Plan:    Problem: Schizoaffective disorder  Status of problem: chronic and stable  Interventions:   - Continue Prolixin  50 mg IM q30 days  - Continue duloxetine  20mg  daily (prescribed by orthopedics, started Sep 2025)    Problem: TUD  Status of problem:  new problem to this provider  Interventions:  - Previously documented that pt quit without NRT in April 2025.   - Currently smoking 1 cigarette per day.     Problem: High risk medication monitoring   Status of problem:  up to date  Interventions:  - appropriate for yearly monitoring     Lab Results   Component Value Date    A1C 5.6 04/23/2024      Lab Results   Component Value Date    LDL 75 04/23/2024    LDL 88 11/02/2022    LDL 107 (H) 11/03/2021       BP Readings from Last 3 Encounters:   09/24/24 150/82   09/16/24 161/99   09/02/24 140/60       Psychotherapy provided:  No billable psychotherapy service provided.    Patient has been given this writer's contact information as well as the [facility] Psychiatry urgent line number. The patient has been instructed to call 911 for emergencies.  Patient was seen and plan of care was discussed with the Attending MD,Dr. Glenora, who agrees with the above statement and plan.    Subjective:  Describes herself as in and out since her last visit (describing going to the ED due to BP). Feels rough, woozy today due to her blood pressure being elevated. Her left shoulder (h/o surgery) was hurting her and they started her on cymbalta , which helped. She also has some leg swelling. She says she feels bad at times, describing feeling depressed. Feels arguing with her daughter is contributing. Identifies prolixin  as helpful. Has AH towards the time that prolixin  is due. These are most prominent in the evenings when going to bed and are the voices of her 2 husbands (who have passed away). AH are not worse with her feeling down lately. Makes her feel something might happen to her. Clarifies that voices do not tell her to harm herself and she does not have thoughts of harming herself or not wanting to be alive. Has been taking cymbalta  20mg  daily. Lives with daughter.       Objective:    Mental Status Exam:   Appearance:    Appears stated age   Motor:   No abnormal movements   Speech/Language:    Normal rate, volume, tone, fluency and Impaired articulation   Mood:   In and out,  rough   Affect:   Euthymic   Thought process and Associations:   Logical, linear, clear, coherent, goal directed   Abnormal/psychotic thought content:     Denies SI, HI, self harm, delusions, obsessions, paranoid ideation, or ideas of reference   Perceptual disturbances:     Endorses infrequent auditory hallucinations. Most often at the end of the month when due for her LAI.     Visit was completed face to face.    I personally spent 45 minutes face-to-face and non-face-to-face in the care of this patient, which includes all pre, intra, and post visit time on the date of service.  All documented time was specific to the E/M visit and does not include any procedures that may have been performed.        Recardo GORMAN Coaster, MD

## 2024-09-24 NOTE — Unmapped (Signed)
Form faxed to [employer].

## 2024-09-24 NOTE — Unmapped (Signed)
 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    Relevant Medications    gabapentin  (NEURONTIN ) 100 MG capsule    Atrial fibrillation, unspecified type    (CMS-HCC)    Subacute pulmonary embolism    (CMS-HCC)       Musculoskeletal and Integument    Osteoarthrosis neck    Age-related osteoporosis without current pathological fracture       Genitourinary    Chronic renal disease, stage 2, mildly decreased glomerular filtration rate (GFR) between 60-89 mL/min/1.73 square meter       Other    Schizoaffective disorder    (CMS-HCC)     Other Visit Diagnoses         Diabetic polyneuropathy associated with type 2 diabetes mellitus    (CMS-HCC)        Relevant Medications    gabapentin  (NEURONTIN ) 100 MG capsule            Med refills   Mostly stable see q 6 weeks check  Updated problem list annotations  Database review and update  Med review and update  Lab review and update  Counseling provided and patient education    40 minutes with patient- 1/2 in face to face counseling on chronic health conditions and  concerns of family     SUBJECTIVE:    Tammy Braun is a 83 y.o. 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   CT recent ER visit  Large diaphragmatic defect with intrathoracic stomach, multiple loops of bowel, and proximal pancreas.   Severe intralobular emphysema   .     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 Her smoking use included cigarettes. She has never used smokeless tobacco.    The following portions of the patient's history were reviewed and updated as appropriate: allergies, current medications, past family history, past medical history, past social history, past surgical history and problem list.    Review of systems for 10 organ systems conducted and no significant positives      OBJECTIVE:    Vital signs have been reviewed:   Vitals:    09/24/24 1150   BP: 150/82   Pulse:    Temp:    vss

## 2024-09-24 NOTE — Unmapped (Signed)
 Form has been faxed.

## 2024-09-24 NOTE — Unmapped (Signed)
 Declined flu shot

## 2024-09-25 DIAGNOSIS — F17200 Nicotine dependence, unspecified, uncomplicated: Principal | ICD-10-CM

## 2024-09-25 DIAGNOSIS — Z79899 Other long term (current) drug therapy: Principal | ICD-10-CM

## 2024-09-25 DIAGNOSIS — F259 Schizoaffective disorder, unspecified: Principal | ICD-10-CM

## 2024-09-25 NOTE — Unmapped (Signed)
 It was a pleasure seeing you today in clinic! As we discussed, continue your current medications. Dr. Omie will see you again on October 31st, but please reach out via MyChart or call the clinic with any concerns in the meantime.     Follow-up instructions:  - Please continue taking your medications as prescribed for your mental health.   - Do not make changes to your medications, including taking more or less than prescribed, unless under the supervision of your physician. Be aware that some medications may make you feel worse if abruptly stopped  - Please refrain from using illicit substances, as these can affect your mood and could cause anxiety or other concerning symptoms.   - Seek further medical care for any increase in symptoms or new symptoms such as thoughts of wanting to hurt yourself or hurt others.     Contact info:  Life-threatening emergencies: Call 911, the 988 suicide and crisis lifeline, or go to the nearest ER for medical or psychiatric attention.           Issues that need urgent attention but are not life threatening: Call the clinic outpatient front desk for assistance:    - [phone] for [place] [facility] and adult psychiatry clinics located at [address] [facility]    Non-urgent routine concerns and questions: Send a message through [facility] or call our clinic front desk.    Refill requests: Check with your pharmacy to initiate refill requests.    Regarding appointments:  - If you need to cancel your appointment, we ask that you call your clinic at least 24 hours before your scheduled appointment at the numbers listed above.  - If for any reason you arrive 15 minutes later than your scheduled appointment time, you may not be seen and your visit may be rescheduled.  - Please remember that we will not automatically reschedule missed appointments.  - If you miss two (2) appointments without letting us  know in advance, you will likely be referred to a provider in your community.  - We will do our best to be on time. Sometimes an emergency will arise that might cause your clinician to be late. We will try to inform you of this when you check in for your appointment. If you wait more than 15 minutes past your appointment time without such notice, please speak with the front desk staff.    In the event of bad weather, the clinic staff will attempt to contact you, should your appointment need to be rescheduled. Additionally, you can call the Patient Weather Line [phone] for system-wide clinic status    For more information and reminders regarding clinic policies (these were provided when you were admitted to the clinic), please ask the front desk.

## 2024-09-25 NOTE — Unmapped (Signed)
 Hi Dr. Elige,    There is a form in your box requiring your signature.    Thank you,  Massie

## 2024-09-28 NOTE — Unmapped (Signed)
 Patient is aligned to the [employer] as confirmed by the [employer] RE[employer] banner in Epic and qualifies for Chronic Care Management Services.     The patient has been informed that they are eligible for a Benefit Engagement Incentive Waiver which covers the coinsurance requirement for Chronic Care Management services as long as they remain aligned to [employer]: Yes

## 2024-09-28 NOTE — Unmapped (Signed)
 I saw and evaluated the patient, participating in the key portions of the service.  I reviewed the resident???s note.  I agree with the resident???s findings and plan. Augusto Garbe, MD

## 2024-09-29 NOTE — Unmapped (Signed)
 Form faxed to [employer] [phone]

## 2024-09-30 MED ADMIN — fluPHENAZine decanoate (PROLIXIN) injection 50 mg: 50 mg | INTRAMUSCULAR | @ 16:00:00

## 2024-09-30 NOTE — Unmapped (Signed)
 [facility] ED Post-Discharge Call    5 or more prescribed daily medications    Contacted patient at [phone] to discuss ED visit. Confirmed patient identity.     We want to ensure you understood your plan of care. Review AVS instructions. Did your discharge instructions answer all of your questions? yes  Have you made a follow-up appointment? yes  Have you filled your prescriptions (if applicable)? N/A  Do you have any questions regarding your prescriptions?N/A  Do you have any other questions? No  Return precautions discussed.

## 2024-09-30 NOTE — Unmapped (Signed)
 Hi Dr. Elige,    There is a form in your box requiring your signature.    Thank you,  Massie

## 2024-09-30 NOTE — Unmapped (Signed)
 Administered long-acting injectable today. See MAR for additional documentation. Sharyl Nimrod, CMA

## 2024-10-01 NOTE — Unmapped (Signed)
 Hi Dr. Elige,    There is a form in your box requiring your signature.    Thank you,  Massie

## 2024-10-08 DIAGNOSIS — I272 Pulmonary hypertension, unspecified: Principal | ICD-10-CM

## 2024-10-08 DIAGNOSIS — M25571 Pain in right ankle and joints of right foot: Principal | ICD-10-CM

## 2024-10-08 DIAGNOSIS — F259 Schizoaffective disorder, unspecified: Principal | ICD-10-CM

## 2024-10-08 DIAGNOSIS — G603 Idiopathic progressive neuropathy: Principal | ICD-10-CM

## 2024-10-08 DIAGNOSIS — M158 Other polyosteoarthritis: Principal | ICD-10-CM

## 2024-10-08 DIAGNOSIS — M62838 Other muscle spasm: Principal | ICD-10-CM

## 2024-10-08 DIAGNOSIS — Z8551 Personal history of malignant neoplasm of bladder: Principal | ICD-10-CM

## 2024-10-08 DIAGNOSIS — I10 Essential (primary) hypertension: Principal | ICD-10-CM

## 2024-10-08 DIAGNOSIS — M25572 Pain in left ankle and joints of left foot: Principal | ICD-10-CM

## 2024-10-08 DIAGNOSIS — Z8719 Personal history of other diseases of the digestive system: Principal | ICD-10-CM

## 2024-10-08 DIAGNOSIS — I480 Paroxysmal atrial fibrillation: Principal | ICD-10-CM

## 2024-10-08 DIAGNOSIS — E1149 Type 2 diabetes mellitus with other diabetic neurological complication: Principal | ICD-10-CM

## 2024-10-08 DIAGNOSIS — G629 Polyneuropathy, unspecified: Principal | ICD-10-CM

## 2024-10-08 DIAGNOSIS — J432 Centrilobular emphysema: Principal | ICD-10-CM

## 2024-10-08 MED ORDER — POTASSIUM CHLORIDE ER 10 MEQ TABLET,EXTENDED RELEASE(PART/CRYST)
ORAL_TABLET | Freq: Every day | ORAL | 11 refills | 30.00000 days | Status: CP
Start: 2024-10-08 — End: 2024-10-08

## 2024-10-08 MED ORDER — CYCLOBENZAPRINE 5 MG TABLET
ORAL_TABLET | ORAL | 5 refills | 0.00000 days | Status: CP
Start: 2024-10-08 — End: ?

## 2024-10-08 NOTE — Unmapped (Signed)
 Form faxed to [employer] [phone]

## 2024-10-08 NOTE — Unmapped (Signed)
 Chief Complaint   Patient presents with    Foot Swelling     Pt complaining of bilateral foot pain and swelling x 2 months. No relief with Tylenol .       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)       Cardiovascular and Mediastinum    Primary hypertension    Paroxysmal atrial fibrillation    (CMS-HCC)    Pulmonary hypertension, unspecified    (CMS-HCC)       Nervous and Auditory    Peripheral neuropathy       Musculoskeletal and Integument    Osteoarthrosis neck       Other    Schizoaffective disorder    (CMS-HCC)    Arthralgia of both ankles    History of bladder cancer    History of small bowel obstruction     Other Visit Diagnoses         Neuropathy    -  Primary    Relevant Orders    Ambulatory referral to Neurology      Muscle spasms of neck        Relevant Medications    cyclobenzaprine  (FLEXERIL ) 5 MG tablet              Updated problem list annotations  Database review and update  Med review and update  Lab review and update  Counseling provided and patient education    SUBJECTIVE:    Tammy Braun is a 83 y.o. female that presents to clinic today regarding the following issues:         reports that she has quit smoking. Her smoking use included cigarettes. She has never used smokeless tobacco.    The following portions of the patient's history were reviewed and updated as appropriate: allergies, current medications, past family history, past medical history, past social history, past surgical history and problem list.    Review of systems for 10 organ systems conducted and no significant positives      OBJECTIVE:    Vital signs have been reviewed:   Vitals:    10/08/24 1503   BP: 148/107   Pulse:    Temp:

## 2024-10-08 NOTE — Unmapped (Signed)
 Chief Complaint   Patient presents with    Foot Swelling     Pt complaining of bilateral foot pain and swelling x 2 months. No relief with Tylenol .       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)       Cardiovascular and Mediastinum    Primary hypertension    Paroxysmal atrial fibrillation    (CMS-HCC)    Pulmonary hypertension, unspecified    (CMS-HCC)       Nervous and Auditory    Peripheral neuropathy       Musculoskeletal and Integument    Osteoarthrosis neck       Other    Schizoaffective disorder    (CMS-HCC)    Arthralgia of both ankles    History of bladder cancer    History of small bowel obstruction     Other Visit Diagnoses         Neuropathy    -  Primary    Relevant Orders    Ambulatory referral to Neurology      Muscle spasms of neck        Relevant Medications    cyclobenzaprine  (FLEXERIL ) 5 MG tablet            BP elevated  Add spironolactone  low dose  Reeturn in two weeks  Check BMP on rtc  Refil ofther meds  Ref neurology neuropathy per request of patient's daughter  Updated problem list annotations  Database review and update  Med review and update  Lab review and update  Counseling provided and patient education    40 minutes with patient- 1/2 in face to face counseling on chronic health conditions and  htn and pain    SUBJECTIVE:    Tammy Braun is a 83 y.o. female that presents to clinic today regarding the following issues:    Seen by me last month  Has seen psychiatry  No new complaints  Similar old ones  HTN meds takes lasix  20 mg and dilt 180 mg daily  Some pedal edema  Was in ER then one time diarrhea  Resolved  5 years out from cystectomy, does not require further oncologic follow up   large parastomal hernia with significant prolapse - and required urgent repair.   CT recent ER visit  Large diaphragmatic defect with intrathoracic stomach, multiple loops of bowel, and proximal pancreas.   Severe intralobular emphysema   .      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 Her smoking use included cigarettes. She has never used smokeless tobacco.    The following portions of the patient's history were reviewed and updated as appropriate: allergies, current medications, past family history, past medical history, past social history, past surgical history and problem list.    Review of systems for 10 organ systems conducted and no significant positives      OBJECTIVE:    Vital signs have been reviewed:   Vitals:    10/08/24 1503   BP: 148/107   Pulse:  Temp:

## 2024-10-08 NOTE — Unmapped (Addendum)
 To Whom This Concerns      At this time, as of today, I believe that ms. Schmelter is capable of making decisions about her own health care, which medications she will take and will not take, what things she will agree to and what things she will not do.    These may not always be what I think is best, but I respect her ability to make her own decisions today as it currently exists.    If her health declines, we can revaluate.        Juliene Row, MD  Professor, [facility] Medicine

## 2024-10-09 MED ORDER — SPIRONOLACTONE 25 MG TABLET
ORAL_TABLET | ORAL | 3 refills | 0.00000 days | Status: CP
Start: 2024-10-09 — End: ?

## 2024-10-12 ENCOUNTER — Encounter
Admission: EM | Admit: 2024-10-12 | Discharge: 2024-10-16 | Disposition: A | Discharge status: Home Health | Payer: MEDICARE | Admitting: Student in an Organized Health Care Education/Training Program

## 2024-10-12 ENCOUNTER — Ambulatory Visit
Admission: EM | Admit: 2024-10-12 | Discharge: 2024-10-16 | Disposition: A | Discharge status: Home Health | Payer: MEDICARE | Admitting: Student in an Organized Health Care Education/Training Program

## 2024-10-12 ENCOUNTER — Inpatient Hospital Stay
Admission: EM | Admit: 2024-10-12 | Discharge: 2024-10-16 | Disposition: A | Discharge status: Home Health | Payer: MEDICARE | Admitting: Student in an Organized Health Care Education/Training Program

## 2024-10-12 LAB — COMPREHENSIVE METABOLIC PANEL
ALBUMIN: 3.5 g/dL (ref 3.4–5.0)
ALKALINE PHOSPHATASE: 83 U/L (ref 46–116)
ALT (SGPT): 8 U/L — ABNORMAL LOW (ref 10–49)
ANION GAP: 14 mmol/L (ref 5–14)
AST (SGOT): 22 U/L (ref <=34)
BILIRUBIN TOTAL: 0.6 mg/dL (ref 0.3–1.2)
BLOOD UREA NITROGEN: 17 mg/dL (ref 9–23)
BUN / CREAT RATIO: 17
CALCIUM: 10 mg/dL (ref 8.7–10.4)
CHLORIDE: 100 mmol/L (ref 98–107)
CO2: 25.9 mmol/L (ref 20.0–31.0)
CREATININE: 0.98 mg/dL (ref 0.55–1.02)
EGFR CKD-EPI (2021) FEMALE: 58 mL/min/1.73m2 — ABNORMAL LOW (ref >=60)
GLUCOSE RANDOM: 164 mg/dL (ref 70–179)
POTASSIUM: 5.9 mmol/L — ABNORMAL HIGH (ref 3.4–4.8)
PROTEIN TOTAL: 8.3 g/dL — ABNORMAL HIGH (ref 5.7–8.2)
SODIUM: 140 mmol/L (ref 135–145)

## 2024-10-12 LAB — HIGH SENSITIVITY TROPONIN I - 2 HOUR SERIAL
HIGH SENSITIVITY TROPONIN - DELTA (0-2H): 4 ng/L (ref <=7)
HIGH-SENSITIVITY TROPONIN I - 2 HOUR: 27 ng/L (ref <=34)

## 2024-10-12 LAB — CBC W/ AUTO DIFF
BASOPHILS ABSOLUTE COUNT: 0 10*9/L (ref 0.0–0.1)
BASOPHILS RELATIVE PERCENT: 0.5 %
EOSINOPHILS ABSOLUTE COUNT: 0 10*9/L (ref 0.0–0.5)
EOSINOPHILS RELATIVE PERCENT: 0.1 %
HEMATOCRIT: 40.5 % (ref 34.0–44.0)
HEMOGLOBIN: 13.3 g/dL (ref 11.3–14.9)
LYMPHOCYTES ABSOLUTE COUNT: 0.8 10*9/L — ABNORMAL LOW (ref 1.1–3.6)
LYMPHOCYTES RELATIVE PERCENT: 7.7 %
MEAN CORPUSCULAR HEMOGLOBIN CONC: 32.9 g/dL (ref 32.0–36.0)
MEAN CORPUSCULAR HEMOGLOBIN: 29.1 pg (ref 25.9–32.4)
MEAN CORPUSCULAR VOLUME: 88.5 fL (ref 77.6–95.7)
MEAN PLATELET VOLUME: 7.5 fL (ref 6.8–10.7)
MONOCYTES ABSOLUTE COUNT: 0.7 10*9/L (ref 0.3–0.8)
MONOCYTES RELATIVE PERCENT: 6.8 %
NEUTROPHILS ABSOLUTE COUNT: 8.3 10*9/L — ABNORMAL HIGH (ref 1.8–7.8)
NEUTROPHILS RELATIVE PERCENT: 84.9 %
NUCLEATED RED BLOOD CELLS: 0 /100{WBCs} (ref <=4)
PLATELET COUNT: 349 10*9/L (ref 150–450)
RED BLOOD CELL COUNT: 4.58 10*12/L (ref 3.95–5.13)
RED CELL DISTRIBUTION WIDTH: 17.1 % — ABNORMAL HIGH (ref 12.2–15.2)
WBC ADJUSTED: 9.8 10*9/L (ref 3.6–11.2)

## 2024-10-12 LAB — PRO-BNP: PRO-BNP: 4504 pg/mL — ABNORMAL HIGH (ref <=300.0)

## 2024-10-12 LAB — HIGH SENSITIVITY TROPONIN I - SERIAL: HIGH SENSITIVITY TROPONIN I: 23 ng/L (ref <=34)

## 2024-10-12 LAB — LIPASE: LIPASE: 20 U/L (ref 12–53)

## 2024-10-12 LAB — LACTATE SEPSIS, VENOUS: LACTATE BLOOD VENOUS: 1.2 mmol/L (ref 0.5–1.8)

## 2024-10-12 LAB — MAGNESIUM: MAGNESIUM: 2 mg/dL (ref 1.6–2.6)

## 2024-10-12 MED ADMIN — ondansetron (ZOFRAN) injection 4 mg: 4 mg | INTRAVENOUS | @ 18:00:00 | Stop: 2024-10-12

## 2024-10-12 MED ADMIN — furosemide (LASIX) injection 20 mg: 20 mg | INTRAVENOUS | @ 22:00:00 | Stop: 2024-10-12

## 2024-10-12 MED ADMIN — acetaminophen (TYLENOL) tablet 1,000 mg: 1000 mg | ORAL | @ 21:00:00 | Stop: 2024-10-12

## 2024-10-12 NOTE — Unmapped (Addendum)
 PCP Follow Up:  [ ]  on Tuesday appt w/ Dr. Elige: repeat EKG and place ziopatch  [ ]  consider additional daily dose of lasix  (20 mg BID) based on weight, lower extremity edema, or respiratory symptoms, especially if unable to safely take spironolactone    [ ]  gave daughter instructions to titrate bowel regimen on discharge if pt has diarrhea at home - please make sure she and patient are aware of how to titrate miralax /senna/immodium  [ ]  consider GI referral for intermittent diarrhea if patient/daughter interested.   [ ]  CCM - patient's daughter interested in [facility] and padded bedside commode for DME, inpatient CM informed her that these may not be covered by insurance but if you could assist with looking into this more, it would be much appreciated!     Tammy Braun was admitted for nausea and diarrhea, along with hyperkalemia (5.9) likely in the setting of recently starting spironolactone . Spironolactone  was held on admission. EKG did not demonstrate widened QRS or peaked T waves but did demonstrate QT prolongation. Patient received IV lasix  and K normalized the next morning.     Her nausea also improved and she tolerated oral food without ongoing vomiting. She had one episode of vomiting the first morning of admission but no further prior to discharge. She continued to have intermittent diarrhea during admission, thus bowel regimen was downtitrated prior to discharge. We provided recommendations for use of miralax /senna/immodium in the discharge instructions. Given the patient had negative C diff and GIPP one month ago from the ED, and had no leukocytosis, fever, or abdominal pain, and diarrhea was non-bloody, decision made not to repeat infectious testing. Discussed with daughter that they can pursue further workup through PCP with referral to GI or additional stool studies if interested, but that diarrhea not profound enough to lead to dehydration or electrolyte abnormalities, so still safe to discharge.     Her QT interval remained prolonged on EKG and telemetry monitoring despite normalizaiton of electrolytes. TSH checked and also normal. QT prolonging meds held (anti-emetics) and QT still did not improve after holding for > 24 hours. She is on prolixin  injections for her schizoaffective disorder but this is not a new medication. Cardiology was consulted and recommended initiation of nadolol 40 mg and placement of a ziopatch at PCP follow up appointment to monitor PVC burden and QT interval. Their service will also arrange sooner follow up with her primary cardiologist.

## 2024-10-12 NOTE — Unmapped (Signed)
 Presents with with N/V beginning last night. States that diarrhea began today. Endorses mid epigastric pain. Daughter states that output from urostomy is dark.

## 2024-10-12 NOTE — Unmapped (Signed)
 [facility]  Emergency Department Provider Note    ED Clinical Impression     Final diagnoses:   Diarrhea, unspecified type (Primary)   Shortness of breath       HPI, ED Course, Assessment and Plan     Initial Clinical Impression:    October 12, 2024 3:13 PM   Tammy Braun is a 83 y.o. female with past medical history of HTN, HLD, T2DM, hyponatremia, stage 2 CKD, COPD, emphysema, PVCs, CAD, osteoarthritis, a-fib, NSTEMI, CHF, chronic PE, schizoaffective disorder, and bladder cancer (s/p bladder removal w/ ileal-conduit with urostomy) presenting with vomiting that began last night. The patient's daughter denies any blood in the vomit, but endorses it to be dark. She also reports the patient's urostomy output to be darker than usual. Currently, the patient feels some abdominal pain with increased shortness of breath. The abdominal pain radiates to the lower R area where her urostomy bag is, and worsens when she coughs. She also has a healing wound to the front of her abdomen due to a recent procedure for a blockage. She has been ambulating with a walker since. She recently started taking spironolactone  12.5mg  daily. Her other daily medications include Eliquis  5mg , Lasix  20mg , cyclobenzaprine  5mg , and diltiazem  120mg . She does not require O2 during the day, but uses it at night along with inhalers. Denies chest pain.     Physical exam shows epigastric tenderness. There is a urostomy in the RLQ that has prolapsed but is soft, pink, and reducible. Output is of an orange tinge with sediments of clear discharge.    BP 136/75  - Pulse 73  - Temp 36.5 ??C (97.7 ??F) (Temporal)  - Resp 22  - Wt 55.8 kg (123 lb)  - SpO2 97%  - BMI 22.50 kg/m??     Medical Decision Making    83 year old female past medical history as above presents with epigastric pain, shortness of breath and new oxygen requirement as well as vomiting dark emesis.  On exam patient is afebrile, hemodynamically stable.  She has a new 3 L oxygen requirement for age and oxygen desaturation to 78% on room air.  She does have COPD.  She is feeling more shortness of breath than prior.  Also with history of prior PEs on Eliquis  with no missed doses.  She is not having chest pain to suggest ACS.  Differential does include ACS, PE, dissection, COPD exacerbation among other things.  Will repeat CTA chest as well as obtain EKG and troponin as well as BMP.  In regards to her epigastric pain with radiation, differential includes but is not limited to gastritis, peptic ulcer, bleeding ulcer, lower concern for perforation given reassuring abdominal exam.  There is also concern for SBO though patient is having some diarrhea.  She does have an ostomy in the right lower quadrant that has appropriate urine output.  It is prolapsed though it is easily reducible and tissue appears pink and healthy.  Plan for laboratory analysis and further imaging of CT abdomen.    Further ED updates and updates to plan as per ED Course below:    ED Course as of 10/13/24 0003   Mon Oct 12, 2024   1904 XR Chest Portable  IMPRESSION:  Right lower lung airspace disease concerning for pneumonia or aspiration     2017 MAO paged   2035 Patient signed out to admitting team   2138 Discussed with family medicine.  They were able to wean the patient off of  oxygen.  They had a discussion with the patient and family who stated that that she would like to be discharged home if able.  Will attempt to p.o. challenge the patient and assess for desaturation.   2300 Patient signed out to oncoming ED resident in stable condition.  At this time patient continues to be nauseous and is retrialing p.o.  We have given a second dose of Zofran .  Her workup thus far does show elevated proBNP without signs of decreased ejection fraction on POCUS.  Lowering concern for CHF exacerbation.  She does have mild hyperkalemia with an EKG with new T wave inversions though no peaked T waves or QRS widening to suggest hyperkalemic emergency. Her CBC is reassuring as well and no significant LFT abnormalities.  She is having ongoing nausea and is requesting admission.  I have reached back out to family medicine team to come and reevaluate the patient and they are agreeable to doing so.         External Records Reviewed: I have reviewed recent and relevant previous record, including: Outpatient notes - 10/08/2024 [facility] Visit for [facility]    Independent Interpretation of Studies: I have independently interpreted the following studies:  CT A/P -intrathoracic viscus  ED POCUS -normal LV systolic function  XR Chest - Right lower lung airspace disease concerning for pneumonia or aspiration  EKG - EKG with a rate of 78, normal axis, intervals, there are multiple PVCs.  No ST elevations or depressions that she does have diffuse T wave inversions especially prominent in leads II, III, aVF, V3 and V4.  These are stable from earlier today.    Discussion of Management With Other Providers or Support Staff: I discussed the management of this patient with the:  My discussion with family medicine are as above        Social Determinants that significantly affected care: NA            Social Drivers of Health with Concerns     Tobacco Use: Medium Risk (10/08/2024)    Patient History     Smoking Tobacco Use: Former     Smokeless Tobacco Use: Never     Passive Exposure: Not on file   Physical Activity: Inactive (08/12/2023)    Exercise Vital Sign     Days of Exercise per Week: 0 days     Minutes of Exercise per Session: 0 min   Stress: Stress Concern Present (08/12/2023)    [employer] of Occupational Health - Occupational Stress Questionnaire     Feeling of Stress : To some extent   Substance Use: Not on file (08/12/2024)   Social Connections: Moderately Isolated (08/12/2023)    Social Connection and Isolation Panel     Frequency of Communication with Friends and Family: More than three times a week     Frequency of Social Gatherings with Friends and Family: Once a week     Attends Religious Services: More than 4 times per year     Active Member of [employer] or Organizations: No     Attends Banker Meetings: Never     Marital Status: Widowed     _____________________________________________________________________    The case was discussed with the attending physician who is in agreement with the above assessment and plan    Past History     PAST MEDICAL HISTORY/PAST SURGICAL HISTORY:   Past Medical History[1]    Past Surgical History[2]    MEDICATIONS:  Current Facility-Administered Medications:     acetaminophen  (TYLENOL ) solution 325 mg, 325 mg, Oral, Once, Isaiah Lynwood Simpers, MD    fluPHENAZine  decanoate (PROLIXIN ) injection 50 mg, 50 mg, Intramuscular, Q30 Days, , 50 mg at 09/30/24 1202    Current Outpatient Medications:     acetaminophen  (TYLENOL ) 500 MG tablet, Take 1 tablet (500 mg total) by mouth every six (6) hours., Disp: 30 tablet, Rfl: 0    albuterol  HFA 90 mcg/actuation inhaler, Inhale 2 puffs every four (4) hours as needed for wheezing., Disp: 18 g, Rfl: 10    apixaban  (ELIQUIS ) 5 mg Tab, Take 1 tablet (5 mg total) by mouth two (2) times a day., Disp: 180 tablet, Rfl: 1    blood sugar diagnostic (GLUCOSE BLOOD) Strp, Disp test strips preferred by insurance plan. Testing qday, Dx: E11.9 (Type 2 DM- controlled), Disp: 30 strip, Rfl: 11    blood-glucose meter kit, Disp. blood glucose meter kit preferred by patient's insurance. Check blood sugars as directed by provider. Dx: Diabetes, E11.9, Disp: 1 each, Rfl: 1    calcium  carbonate-vitamin D3 (CALCIUM  600 + D,3,) 600 mg-10 mcg (400 unit) per tablet, Take 1 tablet by mouth two (2) times a day., Disp: 180 tablet, Rfl: 2    cyclobenzaprine  (FLEXERIL ) 5 MG tablet, 1 po bid prn severe miuscle spasms neck, Disp: 60 tablet, Rfl: 5    diclofenac  sodium (VOLTAREN ) 1 % gel, Apply 2 grams topically four (4) times a day., Disp: 100 g, Rfl: 1    dilTIAZem  (CARDIZEM  CD) 180 MG 24 hr capsule, Take 1 capsule (180 mg total) by mouth daily., Disp: 100 capsule, Rfl: 3    doxycycline  (VIBRA -TABS) 100 MG tablet, 1 po q day for 7 days, Disp: 7 tablet, Rfl: 1    DULoxetine  (CYMBALTA ) 20 MG capsule, Take 1 capsule (20 mg total) by mouth daily., Disp: 30 capsule, Rfl: 2    fluPHENAZine  decanoate (PROLIXIN ) 25 mg/mL injection, Inject 2 mL (50 mg total) into the muscle every thirty (30) days., Disp: 5 mL, Rfl: 11    furosemide  (LASIX ) 20 MG tablet, Take 1 tablet (20 mg total) by mouth daily., Disp: 30 tablet, Rfl: 5    gabapentin  (NEURONTIN ) 100 MG capsule, Take 1 capsule (100 mg total) by mouth Three (3) times a day as needed (pain)., Disp: 90 capsule, Rfl: 0    inhalational spacing device (E-Z SPACER) Spcr, 1 each by Miscellaneous route every four (4) hours as needed., Disp: 1 each, Rfl: 0    lancets Misc, Disp. lancets #30 or amount allowed, Testing once Qday. Dx: E11.9 (Diabetes- controlled), Disp: 30 each, Rfl: 11    MEDICAL SUPPLY ITEM, Entreal formula nutritionally supplement complete caloric density, Disp: 3 application, Rfl: 3    MEDICAL SUPPLY ITEM, Compression stockings knee high, Disp: 2 each, Rfl: 0    MEDICAL SUPPLY ITEM, Group 2 Power Wheelchair, Research scientist (life sciences) , Power Elevating Leg Rests, and Tour manager., Disp: 1 each, Rfl: 0    miscellaneous medical supply Misc, 1 each by Miscellaneous route four (4) times a day., Disp: 100 each, Rfl: 0    spironolactone  (ALDACTONE ) 25 MG tablet, Take 1/2 tablet a day, Disp: 50 tablet, Rfl: 3    underpads 2.6 X 2.9 feet Pads, 1 each by Miscellaneous route two (2) times a day., Disp: 100 each, Rfl: 3    ALLERGIES:   Lisinopril, Losartan , and Hctz [hydrochlorothiazide ]    SOCIAL HISTORY:   Social History     Tobacco Use  Smoking status: Former     Current packs/day: 0.50     Types: Cigarettes    Smokeless tobacco: Never    Tobacco comments:     10cpd, quit about 2 months ago (April 2025)   Substance Use Topics    Alcohol use: No     Alcohol/week: 0.0 standard drinks of alcohol       FAMILY HISTORY:  Family History[3]     Review of Systems     A review of systems was performed and relevant portions were as noted above in HPI     Physical Exam     VITAL SIGNS:    BP 136/75  - Pulse 73  - Temp 36.5 ??C (97.7 ??F) (Temporal)  - Resp 22  - Wt 55.8 kg (123 lb)  - SpO2 97%  - BMI 22.50 kg/m??     Epigastric tenderness. There is a urostomy in the RLQ that has prolapsed but is soft, pink, and reducible. Output is of an orange tinge with sediments of clear discharge.    Constitutional:   Alert and oriented.   Head:   Normocephalic and atraumatic  Eyes:   Conjunctivae are normal, EOMI, PERRL  ENT:   No notable congestion, Mucous membranes moist, External ears normal, no notable stridor  Cardiovascular:   Rate as vitals above. Appears warm and well perfused  Respiratory:   Normal respiratory effort. Breath sounds are normal.  Gastrointestinal:   Epigastric tenderness. There is a urostomy in the RLQ that has prolapsed but is soft, pink, and reducible. Output is of an orange tinge with sediments of clear discharge.  Genitourinary:   Deferred  Musculoskeletal:    Normal range of motion in all extremities. No tenderness or edema noted in B/L lower extremities  Neurologic:   No gross focal neurologic deficits beyond baseline are appreciated.  Skin:   Skin is warm, dry and intact.       Radiology     ED POCUS         CT Abdomen Pelvis Wo Contrast   Final Result   Unchanged moderate hydroureteronephrosis in the setting of ileal conduit. Decreased locules of gas within the bilateral renal pelvises.      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 Previous concern for right lower lobe opacity corresponds to enteric contents from intrathoracic viscus.      Mildly dilated loops of bowel with fluid, which is similar to prior. This may be seen with diarrheal state.         XR Chest Portable   Final Result   Right lower lung airspace disease concerning for pneumonia or aspiration                Labs     Labs Reviewed   COMPREHENSIVE METABOLIC PANEL - Abnormal; Notable for the following components:       Result Value    Potassium 5.9 (*)     eGFR CKD-EPI (2021) Female 37 (*)     Total Protein 8.3 (*)     ALT 8 (*)     All other components within normal limits   PRO-BNP - Abnormal; Notable for the following components:    PRO-BNP 4,504.0 (*)     All other components within normal limits    Narrative:     Effective 09/09/23, NT-proBNP replaces BNP, results are not interchangeable. Values less than 300 pg/mL strongly rule out the presence of acute heart failure.   CBC  W/ AUTO DIFF - Abnormal; Notable for the following components:    RDW 17.1 (*)     Absolute Neutrophils 8.3 (*)     Absolute Lymphocytes 0.8 (*)     Anisocytosis Slight (*)     All other components within normal limits   LIPASE - Normal   MAGNESIUM  - Normal   HIGH SENSITIVITY TROPONIN I - SERIAL - Normal   LACTATE SEPSIS, VENOUS - Normal   HIGH SENSITIVITY TROPONIN I - 2 HOUR SERIAL - Normal   CBC W/ DIFFERENTIAL    Narrative:     The following orders were created for panel order CBC w/ Differential.                  Procedure                               Abnormality         Status                                     ---------                               -----------         ------                                     CBC w/ Differential[[id number]]         Abnormal            Final result                                                 Please view results for these tests on the individual orders.   URINALYSIS WITH MICROSCOPY WITH CULTURE REFLEX    Narrative:     The following orders were created for panel order Urinalysis with Microscopy with Culture Reflex.                  Procedure                               Abnormality         Status                                     ---------                               -----------         ------                                     Urinalysis with Microsc.SABRASABRA[[id number]]  Please view results for these tests on the individual orders.   HIGH SENSITIVITY TROPONIN I - 6 HOUR SERIAL   URINALYSIS WITH MICROSCOPY WITH CULTURE REFLEX PERFORMABLE         Pertinent labs & imaging results that were available during my care of the patient were reviewed by me and considered in my medical decision making (see chart for details).    Please note- This chart has been created using [employer]. Chart creation errors have been sought, but may not always be located and such creation errors, especially pronoun confusion, do NOT reflect on the standard of medical care.      Documentation assistance was provided by Reena Flatten, Scribe, on October 12, 2024 at 3:15 PM for Dorn Stain, MD.    October 13, 2024 12:05 AM. Documentation assistance provided by the scribe. I was present during the time the encounter was recorded. The information recorded by the scribe was done at my direction and has been reviewed and validated by me.            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    Chronic kidney disease     Depression, psychotic    (CMS-HCC)     Diabetes mellitus (CMS-HCC)     in past    Emphysema of lung (CMS-HCC)     Financial difficulties     Frail elderly     Hearing impairment     Hernia     History of transfusion     Hyperlipidemia     Hypertension     Impaired mobility     NSTEMI (non-ST elevated myocardial infarction) (CMS-HCC) 12/25/2023    Osteoporosis     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/08/2015    Visual impairment    [2]   Past Surgical History:  Procedure Laterality Date    ABDOMINAL SURGERY      BLADDER SURGERY      BREAST CYST EXCISION      CHEMOTHERAPY  2012    bladder    GALLBLADDER SURGERY      stone removal    ILEOSTOMY  2012 PR COLONOSCOPY FLX DX W/COLLJ SPEC WHEN PFRMD N/A 02/09/2015    Procedure: COLONOSCOPY, FLEXIBLE, PROXIMAL TO SPLENIC FLEXURE; DIAGNOSTIC, W/WO COLLECTION SPECIMEN BY BRUSH OR WASH;  Surgeon: Eleanor CHRISTELLA Sorrel, MD;  Location: [facility] [place];  Service: Gastroenterology    PR EXPLORATORY OF ABDOMEN N/A 02/28/2024    Procedure: EXPLORATORY LAPAROTOMY, EXPLORATORY CELIOTOMY WITH OR WITHOUT BIOPSY(S);  Surgeon: Hollice Toribio Birmingham, MD;  Location: [facility] OR [facility];  Service: General Surgery    PR RECONSTR TOTAL SHOULDER IMPLANT Left 04/08/2018    Procedure: ARTHROPLASTY, GLENOHUMERAL JOINT; TOTAL SHOULDER(GLENOID & PROXIMAL HUMERAL REPLACEMENT(EG, TOTAL SHOULDER);  Surgeon: Alease Carrie Oyster, MD;  Location: [facility] OR [facility];  Service: Ortho Sports Medicine    PR REOPEN RECENT ABD EXPLORATORY Midline 03/01/2024    Procedure: REOPENING OF RECENT LAPAROTOMY;  Surgeon: Vicci Janas CROME., MD;  Location: OR [facility];  Service: Trauma    PR REOPEN RECENT ABD EXPLORATORY N/A 03/03/2024    Procedure: REOPENING OF RECENT LAPAROTOMY;  Surgeon: Quinn Tinnie Verla Dasie, MD;  Location: OR [facility];  Service: Trauma  PR SIGMOIDOSCOPY,BIOPSY N/A 03/11/2015    Procedure: SIGMOIDOSCOPY, FLEXIBLE; WITH BIOPSY, SINGLE OR MULTIPLE;  Surgeon: Sandrea CHRISTELLA Mood, MD;  [facility]: [facility];  Service: Gastroenterology    PR XCAPSL CTRC RMVL INSJ IO LENS PROSTH W/O ECP Left 06/05/2022    Procedure: EXTRACAPSULAR CATARACT REMOVAL W/INSERTION OF INTRAOCULAR LENS PROSTHESIS, MANUAL OR MECHANICAL TECHNIQUE WITHOUT ENDOSCOPIC CYCLOPHOTOCOAGULATION;  Surgeon: Sinthu Sivarama Ranjan, MD;  Location: [facility] OR [facility];  Service: Ophthalmology    PR XCAPSL CTRC RMVL INSJ IO LENS PROSTH W/O ECP Right 06/19/2022    Procedure: EXTRACAPSULAR CATARACT REMOVAL W/INSERTION OF INTRAOCULAR LENS PROSTHESIS, MANUAL OR MECHANICAL TECHNIQUE WITHOUT ENDOSCOPIC CYCLOPHOTOCOAGULATION;  Surgeon: Sinthu Sivarama Ranjan, MD; Location: [facility] OR [facility];  Service: Ophthalmology   [3]   Family History  Problem Relation Age of Onset    Breast cancer Daughter 70    Diabetes Mother     Glaucoma Father     Colon cancer Neg Hx     Ovarian cancer Neg Hx     Endometrial cancer Neg Hx     Anesthesia problems Neg Hx     Bleeding Disorder Neg Hx         Vikki Carrier, MD  Resident  10/13/24 0005

## 2024-10-12 NOTE — Unmapped (Signed)
 Family Medicine Inpatient Service History & Physical    Assessment & Plan:   Tammy Braun is a 83 y.o. female whose presentation is complicated by  HTN, HLD, T2DM, stage 2 CKD, COPD, emphysema, CAD, osteoarthritis, a-fib (on Eliquis ), CHF, chronic PE, schizoaffective disorder, and bladder cancer (s/p bladder removal w/ ileal-conduit with urostomy) that presented to [facility] Emergency Department with vomiting and diarrhea x 1 day.     Principal Problem:    Gastroenteritis  Active Problems:    Osteoarthrosis neck    DM (diabetes mellitus), type 2 with neurological complications (CMS-HCC)    Hypertension    Tobacco use disorder    Chronic renal disease, stage 2, mildly decreased glomerular filtration rate (GFR) between 60-89 mL/min/1.73 square meter    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)    Schizoaffective disorder    (CMS-HCC)    Pulmonary hypertension    (CMS-HCC)    Atrial fibrillation, unspecified type    (CMS-HCC)    Chronic right-sided heart failure (CMS-HCC)    CAD (coronary artery disease)      Active Problems    #Diarrhea   #Emesis  #Diaphragmatic hernia  #Abdominal pain  Patient having diarrhea with dark brown/orange liquid output, abdominal pain, and non-bloody emesis x 1 day, all of which she says are improving. Patient describes abdominal pain as sharp and feels that gas may be contributing to pain. Coughing makes the abdominal pain worse. After receiving Zofran  and Tylenol  while in the ED, she was able to tolerate a cracker and water . Minimal N/V. On exam, patient has hyperactive bowel sounds, bilateral lower abdominal tenderness, and mild abdominal distension. Lab work and bedside POCUS does not demonstrate dehydration. CT showed unchanged large diaphragmatic hernia with intrathoracic stomach, bowel, and pancreas. Also showed mildly dilated loops of bowel with fluid. Differential diagnosis includes bacterial vs viral gastroenteritis, SBO, abdominal edema, food poisoning. Consider worsening hiatal hernia as abdominal contents are likely edematous d/t diarrhea and irritation, leading to pressure on diaphragmatic hernia, which could be contributing to acute abdominal pain. Holding off on IV fluids iso R heart diastolic dysfunction and clinical stability in terms of dehydration.   -Continue to encourage PO eating and drinking.    -Advance diet as tolerated   -Tylenol  for pain   -Zofran  prn   -Consider GIPP  -Consider IV fluids if continue poor PO intake     #Hyperkalemia   Patient currently asymptomatic. K+ 5.9. EKG does not demonstrate widened QRS or peaked T waves. Recently started Spironolactone . Likely medication induced.   -Hold Spironolactone   -BMP, Mg daily  -Telemetry  -EKG if K+ trending upwards   -Continue Lasix      #CAD  #Right Heart Failure  #Swelling of Foot   Patient reports daily swelling of foot, that are improved with elevation. Takes home Lasix . Does not report taking Aspirin . Last Echo (03/11/24) showed EF 70%. Received Lasix  20 mg iv in the ED which improved LE and abdominal edema. On exam, trace peripheral edema bilaterally, which is baseline according to family. Pro-BNP is 4504. Troponin 27. EKG showed new T wave inversions. Bedside ED POCUS demonstrates mild pericardial effusion, however no cardiac dysfunction and normal RV.   -Continue home Lasix  20 mg.   -Hold home Spironolactone  12.5 daily    Chronic Problems    #COPD  #emphysema  Patient not currently complaining of SOB or increased work of breathing. Breathing comfortably on 1 L of oxygen, and this is her baseline oxygen requirement  at night. SpO2 is 97 on 1 L in the ED. CXR showed confluent airspace opacity involving the RLL. There were no concerns for PE or pneumonia.  -Continue 1 L of oxygen nightly   -Continue albuterol  90 mcg inhaler prn  -daily weights  -Encourage incentive spirometry  -Monitor for any signs of fever and virus.  -If wheezing, consider Duonebs as needed.    #AF  Patient currently asymptomatic. Denies CP and SOB. Normal cardiac exam.   -Continue home Eliquis  5 mg  -Continue home Diltiazem  120 mg daily    #Type 2 DM  Burning in arms and legs x 1 year. Blood glucose 164.   -Continue home Gabapentin  100 mg as needed.   -ssi if hyperglycemic while inpt.    #HTN  Blood pressure is 133/69.   -Hold home Spironolactone  12.5 mg in the setting of hyperkalemia.    #Osteoarthrosis of the neck  #Left shoulder dysfunction   Currently asymptomatic. History of left shoulder arthroplasty.  -Continue Cyclobenzaprine  5 mg daily.   -Consider PT while inpatient    #Right foot wound  -Wound care consult  -Tylenol  500 mg q6h    #HLD  -Not currently on medications.     #Schizoaffective disorder  -Not currently on medications    #history of bladder cancer (s/p bladder removal w/ ileal-conduit with urostomy)   Changes urostomy q2h at baseline. Has changed them twice today, but output increasing and returning to baseline yellow color.  -monitor urine output  -CTM    # Tobacco use disorder: Patient currently smoking 1 cigarette daily. Has smoked for 66 years (1/2 PPD for 15 years)  - Tobacco cessation consult  - Nicotine  replacement therapy with patch, gum, lozenge PRN        Issues Impacting Complexity of Management:      Medical Decision Making: Reviewed records from the following unique sources  inpatient and outpatient records.      Checklist:  Diet: Regular Diet  DVT PPx: Eliquis  5 mg   Code Status: Full Code  Dispo: Patient appropriate for Observation based on expectation at time of admission that period of observation will last less than two midnights    Team Contact Information:   Primary Team: Family Medicine [name]  Primary Resident: Von JONETTA Manning, DO  Resident's Pager: Barbar Suellyn [name] [phone])    Chief Concern:   Gastroenteritis    Subjective:   Tammy Braun is a 83 y.o. female whose presentation is complicated by  HTN, HLD, T2DM, stage 2 CKD, COPD, emphysema, CAD, osteoarthritis, a-fib (on Eliquis ), CHF, chronic PE, schizoaffective disorder, and bladder cancer (s/p bladder removal w/ ileal-conduit with urostomy) that presented to [facility] Emergency Department with vomiting and diarrhea x 1 day.     History obtained by patient and patient's daughter.       HPI:  Patient complaining of diarrhea, non-bloody vomiting, and abdominal pain x 1 day. States that the last meal she ate was [employer] and mashed potatoes yesterday at lunch time. She has been having diarrhea urostomy output that is the same color as baseline, although more liquid, and vomiting, both of which she reports is improving. She has not tried to eat anything since lunch yesterday, but has an appetite and would like to try and eat. Abdominal pain is in the RLQ and made better with tylenol . Patient not currently having n/v, since receiving Zofran  in the ED. States that she has had stomach swelling for about a month. Has not had  a bowel movement since last night, but is passing flatus. Of note, patient has a history of SBO. No history of GI bleed.     Also complaining of orange/yellow urine. Patient's daughter states that she usually changes urostomy every 2 hours; however, today, her urine output has decreased. Had urine output after receiving Lasix  in the ED.     Patient not currently complaining of SOB or CP and states that she is on 1 L of oxygen nightly.        Pertinent Surgical Hx  Past Surgical History[1]    Pertinent Family Hx  Family History[2]      Pertinent Social Hx   Social History     Social History Narrative    Not on file         Allergies  Lisinopril, Losartan , and Hctz [hydrochlorothiazide ]    I reviewed the Medication List. The current list is Accurate  Prior to Admission medications   Medication Dose, Route, Frequency   acetaminophen  (TYLENOL ) 500 MG tablet 500 mg, Oral, Every 6 hours   albuterol  HFA 90 mcg/actuation inhaler 2 puffs, Inhalation, Every 4 hours PRN   apixaban  (ELIQUIS ) 5 mg Tab 5 mg, Oral, 2 times a day (standard)   blood sugar diagnostic (GLUCOSE BLOOD) Strp Disp test strips preferred by insurance plan. Testing qday, Dx: E11.9 (Type 2 DM- controlled)   blood-glucose meter kit Disp. blood glucose meter kit preferred by patient's insurance. Check blood sugars as directed by provider. Dx: Diabetes, E11.9   calcium  carbonate-vitamin D3 (CALCIUM  600 + D,3,) 600 mg-10 mcg (400 unit) per tablet 1 tablet, Oral, 2 times a day (standard)   cyclobenzaprine  (FLEXERIL ) 5 MG tablet 1 po bid prn severe miuscle spasms neck   diclofenac  sodium (VOLTAREN ) 1 % gel Apply 2 grams topically four (4) times a day.   dilTIAZem  (CARDIZEM  CD) 180 MG 24 hr capsule 180 mg, Oral, Daily (standard)   doxycycline  (VIBRA -TABS) 100 MG tablet 1 po q day for 7 days   DULoxetine  (CYMBALTA ) 20 MG capsule 20 mg, Oral, Daily (standard)   fluPHENAZine  decanoate (PROLIXIN ) 25 mg/mL injection 50 mg, Intramuscular, Every 30 days   furosemide  (LASIX ) 20 MG tablet 20 mg, Oral, Daily (standard)   gabapentin  (NEURONTIN ) 100 MG capsule 100 mg, Oral, 3 times a day PRN   inhalational spacing device (E-Z SPACER) Spcr 1 each, Miscellaneous, Every 4 hours PRN   lancets Misc Disp. lancets #30 or amount allowed, Testing once Qday. Dx: E11.9 (Diabetes- controlled)   MEDICAL SUPPLY ITEM Entreal formula nutritionally supplement complete caloric density   MEDICAL SUPPLY ITEM Compression stockings knee high   MEDICAL SUPPLY ITEM Group 2 Power Wheelchair, Research scientist (life sciences) , Power Elevating Leg Rests, and Tour manager.   miscellaneous medical supply Misc 1 each, Miscellaneous, 4 times a day   spironolactone  (ALDACTONE ) 25 MG tablet Take 1/2 tablet a day   underpads 2.6 X 2.9 feet Pads 1 each, Miscellaneous, 2 times a day       [employer] Decision Maker:  Ms. Howley currently has decisional capacity for healthcare decision-making and is able to designate a surrogate healthcare decision maker. Ms. Omahoney designated healthcare decision maker(s) is/are [name] Hales (the patient's adult child) as denoted by stated patient preference.    Objective:   Physical Exam:  Temp:  [36.5 ??C (97.7 ??F)-36.8 ??C (98.2 ??F)] 36.8 ??C (98.2 ??F)  Pulse:  [70-80] 77  SpO2 Pulse:  [68-81] 74  Resp:  [16-27] 19  BP: (117-162)/(65-90) 152/78  SpO2:  [79 %-100 %] 96 %    Gen: NAD, converses, O & A x 4  Eyes: Sclera anicteric, EOMI grossly normal   HENT: Atraumatic, normocephalic  Neck: Trachea midline  Heart: RRR, trace edema; bilateral pedal pulses  Lungs: CTAB, no wheezes, crackles in bilateral upper lung fields  Abdomen: Soft, ND, hyperactive bowel sounds; urostomy in the RLQ with brown/orange output; bilateral lower quadrant tenderness  Extremities:bilateral swelling in feet, with pain in the right foot.   Neuro: Grossly symmetric, non-focal    Skin:  No rashes. Right foot wound not visualized during exam.  Psych: Alert, oriented    Aloysius Sia    I attest that I have reviewed the student note and that the components of the history of present illness, the physical exam, and the assessment and plan documented were performed by me or were performed in my presence by the student where I verified the documentation and performed (or re-performed) the exam and medical decision making    Von CHARM Elizabethann PONCE, DO  [facility], PGY-2         [1]   Past Surgical History:  Procedure Laterality Date    ABDOMINAL SURGERY      BLADDER SURGERY      BREAST CYST EXCISION      CHEMOTHERAPY  2012    bladder    GALLBLADDER SURGERY      stone removal    ILEOSTOMY  2012    PR COLONOSCOPY FLX DX W/COLLJ SPEC WHEN PFRMD N/A 02/09/2015    Procedure: COLONOSCOPY, FLEXIBLE, PROXIMAL TO SPLENIC FLEXURE; DIAGNOSTIC, W/WO COLLECTION SPECIMEN BY BRUSH OR WASH;  Surgeon: Eleanor CHRISTELLA Sorrel, MD;  Location: [facility] [place];  Service: Gastroenterology    PR EXPLORATORY OF ABDOMEN N/A 02/28/2024    Procedure: EXPLORATORY LAPAROTOMY, EXPLORATORY CELIOTOMY WITH OR WITHOUT BIOPSY(S);  Surgeon: Hollice Toribio Birmingham, MD;  Location: [facility];  Service: General Surgery    PR RECONSTR TOTAL SHOULDER IMPLANT Left 04/08/2018    Procedure: ARTHROPLASTY, GLENOHUMERAL JOINT; TOTAL SHOULDER(GLENOID & PROXIMAL HUMERAL REPLACEMENT(EG, TOTAL SHOULDER);  Surgeon: Alease Carrie Oyster, MD;  [facility]: [facility];  Service: Ortho Sports Medicine    PR REOPEN RECENT ABD EXPLORATORY Midline 03/01/2024    Procedure: REOPENING OF RECENT LAPAROTOMY;  Surgeon: Vicci Janas CROME., MD;  Location: OR [facility];  Service: Trauma    PR REOPEN RECENT ABD EXPLORATORY N/A 03/03/2024    Procedure: REOPENING OF RECENT LAPAROTOMY;  Surgeon: Quinn Tinnie Verla Dasie, MD;  Location: OR [facility];  Service: Trauma    PR SIGMOIDOSCOPY,BIOPSY N/A 03/11/2015    Procedure: SIGMOIDOSCOPY, FLEXIBLE; WITH BIOPSY, SINGLE OR MULTIPLE;  Surgeon: Sandrea CHRISTELLA Mood, MD;  [facility];  Service: Gastroenterology    PR XCAPSL CTRC RMVL INSJ IO LENS PROSTH W/O ECP Left 06/05/2022    Procedure: EXTRACAPSULAR CATARACT REMOVAL W/INSERTION OF INTRAOCULAR LENS PROSTHESIS, MANUAL OR MECHANICAL TECHNIQUE WITHOUT ENDOSCOPIC CYCLOPHOTOCOAGULATION;  Surgeon: Sinthu Sivarama Ranjan, MD;  Location: [facility] OR [facility];  Service: Ophthalmology    PR XCAPSL CTRC RMVL INSJ IO LENS PROSTH W/O ECP Right 06/19/2022    Procedure: EXTRACAPSULAR CATARACT REMOVAL W/INSERTION OF INTRAOCULAR LENS PROSTHESIS, MANUAL OR MECHANICAL TECHNIQUE WITHOUT ENDOSCOPIC CYCLOPHOTOCOAGULATION;  Surgeon: Sinthu Sivarama Ranjan, MD;  Location: [facility] OR [facility];  Service: Ophthalmology   [2]   Family History  Problem Relation Age of Onset    Breast cancer Daughter 23    Diabetes Mother  Glaucoma Father     Colon cancer Neg Hx     Ovarian cancer Neg Hx     Endometrial cancer Neg Hx     Anesthesia problems Neg Hx     Bleeding Disorder Neg Hx

## 2024-10-13 LAB — URINALYSIS WITH MICROSCOPY WITH CULTURE REFLEX PERFORMABLE
BILIRUBIN UA: NEGATIVE
BLOOD UA: NEGATIVE
GLUCOSE UA: NEGATIVE
HYALINE CASTS: 1 /LPF (ref 0–1)
KETONES UA: NEGATIVE
NITRITE UA: NEGATIVE
PH UA: 6.5 (ref 5.0–9.0)
RBC UA: 2 /HPF (ref <=4)
SPECIFIC GRAVITY UA: 1.008 (ref 1.003–1.030)
SQUAMOUS EPITHELIAL: 1 /HPF (ref 0–5)
UROBILINOGEN UA: <2
WBC UA: 28 /HPF — ABNORMAL HIGH (ref 0–5)

## 2024-10-13 LAB — BASIC METABOLIC PANEL
ANION GAP: 12 mmol/L (ref 5–14)
BLOOD UREA NITROGEN: 17 mg/dL (ref 9–23)
BUN / CREAT RATIO: 16
CALCIUM: 9.2 mg/dL (ref 8.7–10.4)
CHLORIDE: 102 mmol/L (ref 98–107)
CO2: 25 mmol/L (ref 20.0–31.0)
CREATININE: 1.05 mg/dL — ABNORMAL HIGH (ref 0.55–1.02)
EGFR CKD-EPI (2021) FEMALE: 53 mL/min/1.73m2 — ABNORMAL LOW (ref >=60)
GLUCOSE RANDOM: 108 mg/dL (ref 70–179)
POTASSIUM: 4.4 mmol/L (ref 3.4–4.8)
SODIUM: 139 mmol/L (ref 135–145)

## 2024-10-13 LAB — CBC
HEMATOCRIT: 33.8 % — ABNORMAL LOW (ref 34.0–44.0)
HEMOGLOBIN: 11.4 g/dL (ref 11.3–14.9)
MEAN CORPUSCULAR HEMOGLOBIN CONC: 33.7 g/dL (ref 32.0–36.0)
MEAN CORPUSCULAR HEMOGLOBIN: 29.4 pg (ref 25.9–32.4)
MEAN CORPUSCULAR VOLUME: 87.2 fL (ref 77.6–95.7)
MEAN PLATELET VOLUME: 7.5 fL (ref 6.8–10.7)
PLATELET COUNT: 279 10*9/L (ref 150–450)
RED BLOOD CELL COUNT: 3.88 10*12/L — ABNORMAL LOW (ref 3.95–5.13)
RED CELL DISTRIBUTION WIDTH: 16.9 % — ABNORMAL HIGH (ref 12.2–15.2)
WBC ADJUSTED: 5.2 10*9/L (ref 3.6–11.2)

## 2024-10-13 LAB — MAGNESIUM: MAGNESIUM: 1.9 mg/dL (ref 1.6–2.6)

## 2024-10-13 MED ORDER — SENNOSIDES 8.6 MG TABLET
ORAL_TABLET | Freq: Every evening | ORAL | 2 refills | 30.00000 days
Start: 2024-10-13 — End: 2025-01-11

## 2024-10-13 MED ORDER — POLYETHYLENE GLYCOL 3350 17 GRAM/DOSE ORAL POWDER
Freq: Every day | ORAL | 2 refills | 30.00000 days
Start: 2024-10-13 — End: 2025-01-11

## 2024-10-13 MED ADMIN — acetaminophen (TYLENOL) tablet 650 mg: 650 mg | ORAL | @ 06:00:00

## 2024-10-13 MED ADMIN — senna (SENOKOT) tablet 2 tablet: 2 | ORAL | @ 06:00:00

## 2024-10-13 MED ADMIN — apixaban (ELIQUIS) tablet 5 mg: 5 mg | ORAL | @ 07:00:00

## 2024-10-13 MED ADMIN — DULoxetine (CYMBALTA) DR capsule 20 mg: 20 mg | ORAL | @ 13:00:00

## 2024-10-13 MED ADMIN — acetaminophen (TYLENOL) tablet 650 mg: 650 mg | ORAL | @ 19:00:00

## 2024-10-13 MED ADMIN — polyethylene glycol (MIRALAX) packet 17 g: 17 g | ORAL | @ 13:00:00

## 2024-10-13 MED ADMIN — gabapentin (NEURONTIN) capsule 100 mg: 100 mg | ORAL | @ 19:00:00

## 2024-10-13 MED ADMIN — ondansetron (ZOFRAN) injection 4 mg: 4 mg | INTRAVENOUS | @ 03:00:00 | Stop: 2024-10-12

## 2024-10-13 MED ADMIN — cyclobenzaprine (FLEXERIL) tablet 5 mg: 5 mg | ORAL | @ 08:00:00

## 2024-10-13 MED ADMIN — furosemide (LASIX) tablet 20 mg: 20 mg | ORAL | @ 13:00:00

## 2024-10-13 MED ADMIN — gabapentin (NEURONTIN) capsule 100 mg: 100 mg | ORAL | @ 08:00:00

## 2024-10-13 MED ADMIN — dilTIAZem (CARDIZEM CD) 24 hr capsule 180 mg: 180 mg | ORAL | @ 13:00:00

## 2024-10-13 MED ADMIN — acetaminophen (TYLENOL) tablet 650 mg: 650 mg | ORAL | @ 13:00:00

## 2024-10-13 NOTE — Unmapped (Signed)
 OCCUPATIONAL THERAPY  Evaluation (10/13/24 1454)    Patient Name:  Tammy Braun       Medical Record Number: [id number]     Date of Birth: 1941/12/11  Sex: Female      Post-Discharge Occupational Therapy Recommendations: 5x weekly, Low intensity (If Pt returns home, recommend 3x with 24/7 and assist for ADLs/IADLs, including: bathing, LB dressing, toileting, meal preparation and housekeeping)          Equipment Recommendation  OT DME Recommendations: Defer to post acute       OT Treatment Diagnosis: Reduced mobility, Need for assistance with personal care, Generalized muscle weakness         Assessment  Assessment: Per chart, Tammy Braun is a 83 y.o. female whose presentation is complicated by HTN, HLD, T2DM, stage 2 CKD, COPD, emphysema, CAD, osteoarthritis, a-fib (on Eliquis ), CHF, chronic PE, schizoaffective disorder, and bladder cancer (s/p bladder removal w/ ileal-conduit with urostomy) that presented to [facility] Emergency Department with vomiting and diarrhea x 1 day.     [name] at bedside and assisted with PLOF. At baseline prior to hospital stay in April 2025, Pt ambulating with RW and completing some BADLs. Since discharge from hospital stay in April 2025, Pt has been recieving assistance with BADLs and IADLs. Pt ambulates with a RW when assisted by [facility] therapy, and uses a MWC when needed in the home.        Pt supine in bed upon OT arrival educated on the role of OT and OT evaluation in the acute care setting with Pt agreeable to session. Pt presents to acute OT with the above-mentioned conditions and below baseline function level with ADLs and functional transfers on this date, with Pt completing: bed mobility supine <> sitting EOB with mod A using bed features; boost in bed with max A +2; sit <> stand from EOB with mod-max A using RW and vcs to position feet for stability; functional ambulation ~65ft x2 with min A using RW and decreased clearance of RLE noted during ambulation; while standing at toilet with min A using RW, Pt able to empty her ostomy bag into the toilet with vcs; Pt with an instance of bowel incontinence while standing at toilet and transferred to [facility] with min A using RW; sit > stand from [facility] with mod A using RW and maintained standing with min A using RW while second helper provided total A for peri-area hygiene; Pt requiring several rest breaks throughout hygiene; UB dressing with min A while seated; LB dressing (doffing/donning socks) with max A while seated; total A for environmental clean up after incontinence.  Observed deficits, including decreased functional strength, decreased endurance, decreased functional mobility, impaired balance,and pain all currently impacting Pt's independence and safety with ADLs and functional transfers compared with baseline.  Pt will benefit from skilled acute OT services to address observed deficits and maximize independence and safety with ADLs. Recommend 5xLOW vs 3x with 24/7 assist post-acute. DME: Defer to post acute.     After review of the patient's occupational profile and history, assessment of occupational performance, clinical decision making, and development of POC, the patient presents as a Moderate complexity case.    Problem List: Decreased strength, Impaired balance, Impaired sensation, Decreased activity tolerance, Pain, Decreased endurance, Gait deviation, Decreased mobility, Impaired ADLs, Fall risk        Clinical Decision Making: Moderate Complexity    Today's Interventions: ADL retraining, Balance activities, Bed mobility, Compensatory tech. training, Education -  Patient, Functional mobility, Education - Family / caregiver, Postular / Proximal stability, Positioning, Range of motion, Safety education, Transfer training  Today's Interventions: Pt educated on role of OT, OT eval, OT POC, and safety; bed mobility, functional transfers, functional ambulation, toileting, LB dressing, UB dressing, positioning, AM-PAC    Activity Tolerance During Today's Session  Tolerated treatment well    Plan  Planned Frequency of Treatment:    1-2x per day Weekly Frequency: 3-4 days per week       Planned Interventions:  Education (Patient/Family/Caregiver), Home Exercise Program, Therapeutic Activity, Therapeutic Exercise, Self-Care/Home Training      GOALS:   Patient and Family Goals: to return home    Short Term:   SHORT GOAL #1: Pt will complete toilet t/f with CGA using LRAD; manage clothing and all toileting tasks with min A.   Time Frame : 2 weeks  SHORT GOAL #2: Pt will complete oral care/grooming task seated EOB with SBA.   Time Frame : 2 weeks  SHORT GOAL #3: Pt will complete UB dressing with SBA, using AE as needed.   Time Frame : 2 weeks  SHORT GOAL #4: Pt will complete LB dressing with min A using LRAD and AE as needed.   Time Frame : 2 weeks    Long Term Goal #1: Pt will maximize independence with ADLs and functional transfers within 8 weeks.  Time Frame: 2 months    Prognosis:  Good  Positive Indicators:  motivation, supportive family  Barriers to Discharge: Gait instability, Impaired Balance, Inability to safely perform ADLS, Pain, Functional strength deficits, Endurance deficits    Subjective  Medical Updates Since Last Visit/Relevant PMH Affecting Clinical Decision Making:    Prior Functional Status [name] at bedside and assisted with PLOF. At baseline prior to hospital stay in April 2025, Pt ambulating with RW and completing some BADLs. Since discharge from hospital stay in April 2025, Pt has been recieving assistance with BADLs and IADLs. Pt ambulates with a RW when assisted by [facility] therapy, and uses a MWC when needed.    Living Situation  Living Environment: House  Lives With: Daughter  Home Living: One level home, Shower chair with back, Grab bars in shower, Handicapped height toilet, Ramped entrance, Walk-in shower  Caregiver Identified?: Yes  Caregiver Availability: Intermittent ([name] able to assist when present, though not present 24/7)  Caregiver Ability: Assist with ADLs     Equipment available at home: Rolling walker, Wheelchair-manual, Bedside commode, Oxygen    Medical Tests / Procedures: Reviewed in [facility]  Services patient receives prior to admission: PT, OT (Pt with hospital stay in April 2025, and has been receiving HHOT and HHPT since then, now 1x/week each)    Patient / Caregiver reports: Pt and RN agreeable to OT session.      Past Medical History[1] Social History     Tobacco Use    Smoking status: Former     Current packs/day: 0.50     Types: Cigarettes    Smokeless tobacco: Never    Tobacco comments:     10cpd, quit about 2 months ago (April 2025)   Substance Use Topics    Alcohol use: No     Alcohol/week: 0.0 standard drinks of alcohol      Past Surgical History[2] Family History[3]     Lisinopril, Losartan , and Hctz [hydrochlorothiazide ]     Objective Findings  Precautions / Restrictions  Non-applicable       Weight Bearing  Non-applicable  Required Braces or Orthoses  Non-applicable    Communication Preference  Verbal       Pain  Pt reports 7/10 pain this session to her arms and legs, though agreeable to procede with session. Activities graded to Pt's tolerance and Pt positioned for comfort at end of session.    Equipment / Environment  Vascular access (PIV, TLC, Port-a-cath, PICC), Supplemental oxygen (1L supplemental O2 via [place])    Cognition   Orientation Level:  Oriented x 4   Arousal/Alertness:  Appropriate responses to stimuli   Attention Span:  Appears intact   Memory:  Appears intact   Following Commands:  Follows one-step commands   Safety Judgment:  Good awareness of safety precautions   Awareness of Errors and Problem Solving:  Assistance required to identify errors made, Assistance required to generate solutions, Assistance required to implement solutions    Vision / Hearing   Vision: Wears glasses all the time, Glasses not present  Hearing: Hearing impairment Hand Function:  Right Hand Function: Right hand grip strength, ROM and coordination WNL  Left Hand Function: Left hand grip strength, ROM and coordination WNL  Hand Dominance: Right    Skin Inspection:  Skin Inspection: Intact where visualized    Face/Cervical ROM:  Face ROM: WFL  Cervical ROM comments: NT    ROM / Strength:  UE ROM/Strength: Right Impaired/Limited, Left Impaired/Limited  RUE Impairment: Reduced strength  LUE Impairment: Reduced strength  LE ROM/Strength: Right Impaired/Limited, Left Impaired/Limited  RLE Impairment: Reduced strength  LLE Impairment: Reduced strength  LE ROM/ Strength Comment: Pt with generalized weakness and quick to fatigue during functional tasks.    Coordination:  Coordination: Not tested    Sensation:  RUE Sensation: RUE intact  LUE Sensation: LUE intact  RLE Sensation: RLE impaired  RLE Sensation Impairment: Tingling  LLE Sensation: LLE impaired  LLE Sensation Impairment: Tingling  Sensory/ Proprioception/ Stereognosis comments: tingling to B feet    Balance:  Static Sitting-Level of Assistance: Stand by Camera operator of Assistance: Stand by Surveyor, mining of Assistance: Minimum assistance  Dynamic Standing - Level of Assistance: Minimum assistance  Standing Balance comments: using RW    Functional Mobility  Transfers: Min assist, Verbal assist/cues required (using RW)  Bed Mobility - Needs Assistance: Mod assist (supine <> sitting EOB with mod A using bed features)  Ambulation: ~45ft x2 with min A using RW    ADLs  Toileting: Performed standing, Performed seated, Min assist, Verbal assist/cues required, Total Assist (Pt emptied ostomy bag into toilet while standing with min A using RW and vcs for sequencing ostomy bag; Pt with bowel incontinence requiring min A to transfer to [facility] and total A for peri-area hygiene and clean up)  UB Dressing: Min assist, Performed seated  LB Dressing: Max assist, Performed seated    Vitals / Orthostatics  Vitals/Orthostatics: Pt with SpO2 85% on RA upon OT arrival. Donned 1L supplemental O2 via [place] with improvement to SpO2 96%. NAD throughout remainder of session with supplemental O2 on.    Patient at end of session: All needs in reach, Friends/Family present, In bed, Lines intact, Notified Nurse     Occupational Therapy Session Duration  OT Individual [mins]: 71       AM-PAC-Daily Activity  Lower Body Dressing assistance needs: A lot - Maximum/Moderate Assistance  Bathing assistance needs: A lot - Maximum/Moderate Assistance  Toileting assistance needs: A lot - Maximum/Moderate Assistance  Upper Body Dressing assistance needs: A  Little - Minimal/Contact Guard Assist/Supervision  Personal Grooming assistance needs: A Little - Minimal/Contact Guard Assist/Supervision  Eating Meals assistance needs: A Little - Minimal/Contact Guard Assist/Supervision    Daily Activity Score: 15    Score (in points): % of Functional Impairment, Limitation, Restriction  6: 100% impaired, limited, restricted  7-8: At least 80%, but less than 100% impaired, limited restricted  9-13: At least 60%, but less than 80% impaired, limited restricted  14-19: At least 40%, but less than 60% impaired, limited restricted  20-22: At least 20%, but less than 40% impaired, limited restricted  23: At least 1%, but less than 20% impaired, limited restricted  24: 0% impaired, limited restricted      I attest that I have reviewed the above information.  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    Chronic kidney disease     Depression, psychotic    (CMS-HCC)     Diabetes mellitus (CMS-HCC)     in past    Emphysema of lung (CMS-HCC)     Financial difficulties     Frail elderly     Hearing impairment     Hernia     History of transfusion Hyperlipidemia     Hypertension     Impaired mobility     NSTEMI (non-ST elevated myocardial infarction) (CMS-HCC) 12/25/2023    Osteoporosis     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/08/2015    Visual impairment    [2]   Past Surgical History:  Procedure Laterality Date    ABDOMINAL SURGERY      BLADDER SURGERY      BREAST CYST EXCISION      CHEMOTHERAPY  2012    bladder    GALLBLADDER SURGERY      stone removal    ILEOSTOMY  2012    PR COLONOSCOPY FLX DX W/COLLJ SPEC WHEN PFRMD N/A 02/09/2015    Procedure: COLONOSCOPY, FLEXIBLE, PROXIMAL TO SPLENIC FLEXURE; DIAGNOSTIC, W/WO COLLECTION SPECIMEN BY BRUSH OR WASH;  Surgeon: Eleanor CHRISTELLA Sorrel, MD;  Location: [facility] [place];  Service: Gastroenterology    PR EXPLORATORY OF ABDOMEN N/A 02/28/2024    Procedure: EXPLORATORY LAPAROTOMY, EXPLORATORY CELIOTOMY WITH OR WITHOUT BIOPSY(S);  Surgeon: Hollice Toribio Birmingham, MD;  Location: [facility] OR [place];  Service: General Surgery    PR RECONSTR TOTAL SHOULDER IMPLANT Left 04/08/2018    Procedure: ARTHROPLASTY, GLENOHUMERAL JOINT; TOTAL SHOULDER(GLENOID & PROXIMAL HUMERAL REPLACEMENT(EG, TOTAL SHOULDER);  Surgeon: Alease Carrie Oyster, MD;  [facility]: [facility];  Service: Ortho Sports Medicine    PR REOPEN RECENT ABD EXPLORATORY [place] 03/01/2024    Procedure: REOPENING OF RECENT LAPAROTOMY;  Surgeon: Vicci Janas CROME., MD;  Location: OR [facility];  Service: Trauma    PR REOPEN RECENT ABD EXPLORATORY N/A 03/03/2024    Procedure: REOPENING OF RECENT LAPAROTOMY;  Surgeon: Quinn Tinnie Verla Dasie, MD;  Location: OR [facility];  Service: Trauma    PR SIGMOIDOSCOPY,BIOPSY N/A 03/11/2015    Procedure: SIGMOIDOSCOPY, FLEXIBLE; WITH BIOPSY, SINGLE OR MULTIPLE;  Surgeon: Sandrea CHRISTELLA Mood, MD;  [facility] [place];  Service: Gastroenterology    PR XCAPSL CTRC RMVL INSJ IO LENS PROSTH W/O ECP Left 06/05/2022    Procedure: EXTRACAPSULAR CATARACT REMOVAL W/INSERTION OF INTRAOCULAR LENS PROSTHESIS, MANUAL OR MECHANICAL TECHNIQUE WITHOUT ENDOSCOPIC CYCLOPHOTOCOAGULATION;  Surgeon: Sinthu Sivarama Ranjan, MD;  Location: [facility] OR [facility];  Service: Ophthalmology    PR XCAPSL CTRC RMVL INSJ IO LENS PROSTH W/O ECP Right 06/19/2022    Procedure: EXTRACAPSULAR CATARACT REMOVAL W/INSERTION OF INTRAOCULAR LENS PROSTHESIS, MANUAL OR MECHANICAL TECHNIQUE WITHOUT ENDOSCOPIC CYCLOPHOTOCOAGULATION;  Surgeon: Sinthu Sivarama Ranjan, MD;  Location: [facility] OR [facility];  Service: Ophthalmology   [3]   Family History  Problem Relation Age of Onset    Breast cancer Daughter 2    Diabetes Mother     Glaucoma Father     Colon cancer Neg Hx     Ovarian cancer Neg Hx     Endometrial cancer Neg Hx     Anesthesia problems Neg Hx     Bleeding Disorder Neg Hx

## 2024-10-13 NOTE — Unmapped (Signed)
 Speech Language Pathology Clinical Swallow Assessment  Evaluation (10/13/24 1316)    Patient Name:  Tammy Braun       Medical Record Number: [id number]   Date of Birth: [birth year]  Sex: Female            SLP Treatment Diagnosis: Dysphagia- Oropharyngeal (r/o)     Activity Tolerance: Patient tolerated treatment well    Assessment  Pt seen for clinical evaluation of swallowing in setting of concern for possible aspiration pneumonia. Pt presents with clinical evidence of functional oropharyngeal swallowing mechanism. She consumed PO trials of thin liquids and regular solids from lunch tray without s/sx aspiration. Mild oral residue observed, likely in setting of missing dentition (pt reports she needs new dentures), but residual was cleared independently with liquid rinse and subsequent swallows. Recommend continue regular diet with thin liquids and adherence to general aspiration precautions: small bites/sips, upright positioning, eat only when awake and alert. No further SLP services indicated at this time; please reconsult with acute changes or concerns.  Risk for Aspiration: Mild     Recommendations:              Diet Liquids Recommendations: No Restrictions    Diet Solids Recommendation: Regular Consistency Solids    Recommended Form of Medications: Whole, Crushed, With liquid, With puree (per pt preference)           Post Acute Discharge Recommendations  Post Acute SLP Discharge Recommendations: Skilled SLP services are NOT indicated    Prognosis: Good  Positive Indicators: +performance this date; +previously tolerating PO diet        Plan of Care  SLP Daily Frequency: D/C Services, D/C Services       Treatment Goals:                                                     Patient and Family Goal: Go home today    Subjective  Medical Updates Since Last Visit/Relevant PMH Affecting Clinical Decision Making: Tammy Braun is a 83 y.o. female whose presentation is complicated by  HTN, HLD, T2DM, stage 2 CKD, COPD, emphysema, CAD, osteoarthritis, a-fib (on Eliquis ), CHF, chronic PE, schizoaffective disorder, and bladder cancer (s/p bladder removal w/ ileal-conduit with urostomy) that presented to [facility] with vomiting and diarrhea x 1 day.                     Communication Preference: Verbal, Visual  Patient/Caregiver Reports: Denies swallowing difficulty  Pain: no s/s of pain           Allergies: Lisinopril, Losartan , and Hctz [hydrochlorothiazide ]  Current Medications[1]  Past Medical History[2]  Family History[3]  Past Surgical History[4]  Social History     Tobacco Use    Smoking status: Former     Current packs/day: 0.50     Types: Cigarettes    Smokeless tobacco: Never    Tobacco comments:     10cpd, quit about 2 months ago (April 2025)   Substance Use Topics    Alcohol use: No     Alcohol/week: 0.0 standard drinks of alcohol         General:  Hearing Exceptions: None                 Self-Feeding Capacity: Functional for self-feeding  Medical Tests / Procedures Comments: CXR 10/13: Right lower lung airspace disease concerning for pneumonia or aspiration          Precautions / Restrictions  Precautions: Non-applicable  Weight Bearing Status: Non-applicable  Required Braces or Orthoses: Non-applicable    Objective  Temperature Spikes Noted: N/A  Respiratory Status : Room air  History of Intubation: No          Behavior/Cognition: Alert, Cooperative, Pleasant mood  Positioning : Upright in bed    Oral / Motor Exam  Vocal Quality: Normal  Volitional Swallow: Within Functional Limits   Labial ROM: Within Functional Limits   Labial Symmetry: Within Functional Limits  Labial Strength: Within Functional Limits   Lingual ROM: Within Functional Limits  Lingual Symmetry: Within Functional Limits  Lingual Strength: Within Functional Limits   Lingual Sensation: Within Functional Limits  Velum: elevation grossly WNL   Mandible: Within Functional Limits  Coordination: intact  Facial ROM: Within Functional Limits   Facial Symmetry: Within Functional Limits  Facial Strength: Within Functional Limits  Facial Sensation: Within Functional Limits   Vocal Intensity: Within Functional Limits   Gag: Unable to assess   Apraxia: None present   Dysarthria: None present   Intelligibility: Intelligible   Breath Support: Adequate for speech   Dentition: Some missing teeth    Consistencies assessed: thin liquids, regular solids, mixed consistencies    Patient at end of session: All needs in reach, In bed         Speech Therapy Session Duration  SLP Individual [mins]: 10    I attest that I have reviewed the above information.  Signed: Duwaine SHAUNNA Ewings, SLP    Filed 10/13/2024         [1]   Current Facility-Administered Medications   Medication Dose Route Frequency Provider Last Rate Last Admin    acetaminophen  (TYLENOL ) tablet 650 mg  650 mg Oral Q4H PRN Kaiser, Dallas D, DO   650 mg at 10/13/24 0847    albuterol  (PROVENTIL  HFA;VENTOLIN  HFA) 90 mcg/actuation inhaler 2 puff  2 puff Inhalation Q4H PRN Kaiser, Dallas D, DO        apixaban  (ELIQUIS ) tablet 5 mg  5 mg Oral BID Kayla Skip Caldron, MD   5 mg at 10/13/24 9762    cyclobenzaprine  (FLEXERIL ) tablet 5 mg  5 mg Oral Nightly PRN Kaiser, Dallas D, DO   5 mg at 10/13/24 0414    dilTIAZem  (CARDIZEM  CD) 24 hr capsule 180 mg  180 mg Oral Daily Kaiser, Dallas D, DO   180 mg at 10/13/24 9152    DULoxetine  (CYMBALTA ) DR capsule 20 mg  20 mg Oral Daily Kaiser, Dallas D, DO   20 mg at 10/13/24 9152    furosemide  (LASIX ) tablet 20 mg  20 mg Oral Daily Kaiser, Dallas D, DO   20 mg at 10/13/24 9152    gabapentin  (NEURONTIN ) capsule 100 mg  100 mg Oral TID PRN Kaiser, Dallas D, DO   100 mg at 10/13/24 0414    ondansetron  (ZOFRAN -ODT) disintegrating tablet 4 mg  4 mg Oral Q8H PRN Elizabethann Macleod D, DO        polyethylene glycol (MIRALAX ) packet 17 g  17 g Oral Daily Pauline, [place] D, DO   17 g at 10/13/24 0848    senna (SENOKOT) tablet 2 tablet  2 tablet Oral Nightly Elizabethann Macleod D, DO   2 tablet at 10/13/24 9772   [2]   Past Medical History:  Diagnosis Date  Anxiety     Arthritis     At risk for falls     Breast cyst     Cancer    (CMS-HCC)     bladder    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    Chronic kidney disease     Depression, psychotic    (CMS-HCC)     Diabetes mellitus (CMS-HCC)     in past    Emphysema of lung (CMS-HCC)     Financial difficulties     Frail elderly     Hearing impairment     Hernia     History of transfusion     Hyperlipidemia     Hypertension     Impaired mobility     NSTEMI (non-ST elevated myocardial infarction) (CMS-HCC) 12/25/2023    Osteoporosis     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/08/2015    Visual impairment    [3]   Family History  Problem Relation Age of Onset    Breast cancer Daughter 59    Diabetes Mother     Glaucoma Father     Colon cancer Neg Hx     Ovarian cancer Neg Hx     Endometrial cancer Neg Hx     Anesthesia problems Neg Hx     Bleeding Disorder Neg Hx    [4]   Past Surgical History:  Procedure Laterality Date    ABDOMINAL SURGERY      BLADDER SURGERY      BREAST CYST EXCISION      CHEMOTHERAPY  2012    bladder    GALLBLADDER SURGERY      stone removal    ILEOSTOMY  2012    PR COLONOSCOPY FLX DX W/COLLJ SPEC WHEN PFRMD N/A 02/09/2015    Procedure: COLONOSCOPY, FLEXIBLE, PROXIMAL TO SPLENIC FLEXURE; DIAGNOSTIC, W/WO COLLECTION SPECIMEN BY BRUSH OR WASH;  Surgeon: Eleanor CHRISTELLA Sorrel, MD;  Location: [facility] [place];  Service: Gastroenterology    PR EXPLORATORY OF ABDOMEN N/A 02/28/2024    Procedure: EXPLORATORY LAPAROTOMY, EXPLORATORY CELIOTOMY WITH OR WITHOUT BIOPSY(S);  Surgeon: Hollice Toribio Birmingham, MD;  Location: [facility] OR [facility];  Service: General Surgery    PR RECONSTR TOTAL SHOULDER IMPLANT Left 04/08/2018    Procedure: ARTHROPLASTY, GLENOHUMERAL JOINT; TOTAL SHOULDER(GLENOID & PROXIMAL HUMERAL REPLACEMENT(EG, TOTAL SHOULDER);  Surgeon: Alease Carrie Oyster, MD;  Location: [facility] OR [facility];  Service: Ortho Sports Medicine    PR REOPEN RECENT ABD EXPLORATORY Midline 03/01/2024    Procedure: REOPENING OF RECENT LAPAROTOMY;  Surgeon: Vicci Janas CROME., MD;  Location: OR [facility];  Service: Trauma    PR REOPEN RECENT ABD EXPLORATORY N/A 03/03/2024    Procedure: REOPENING OF RECENT LAPAROTOMY;  Surgeon: Quinn Tinnie Verla Dasie, MD;  Location: OR [facility];  Service: Trauma    PR SIGMOIDOSCOPY,BIOPSY N/A 03/11/2015    Procedure: SIGMOIDOSCOPY, FLEXIBLE; WITH BIOPSY, SINGLE OR MULTIPLE;  Surgeon: Sandrea CHRISTELLA Mood, MD;  Location: [facility];  Service: Gastroenterology    PR XCAPSL CTRC RMVL INSJ IO LENS PROSTH W/O ECP Left 06/05/2022    Procedure: EXTRACAPSULAR CATARACT REMOVAL W/INSERTION OF INTRAOCULAR LENS PROSTHESIS, MANUAL OR MECHANICAL TECHNIQUE WITHOUT ENDOSCOPIC CYCLOPHOTOCOAGULATION;  Surgeon: Sinthu Sivarama Ranjan, MD;  Location: [facility] OR [facility];  Service: Ophthalmology    PR XCAPSL CTRC RMVL INSJ IO LENS PROSTH W/O ECP Right 06/19/2022    Procedure: EXTRACAPSULAR CATARACT REMOVAL W/INSERTION OF INTRAOCULAR LENS PROSTHESIS, MANUAL OR MECHANICAL TECHNIQUE WITHOUT ENDOSCOPIC CYCLOPHOTOCOAGULATION;  Surgeon: Sinthu Sivarama Ranjan, MD;  Location:  [facility] OR [facility];  Service: Ophthalmology

## 2024-10-13 NOTE — Unmapped (Signed)
 Shift Summary  Pt A.O x4, Able to express needs. VSS. C/o pain and meds given. Had diarrhea today, per OT. Ostomy bag intacat. Daughter at bedside. Worked with Pt and OT. Safety precautions in place. Bed low and locked. Call bell within reach. Continue to monitor.  Acetaminophen  and gabapentin  were administered for pain management, with pain levels varying throughout the shift.  Fall reduction strategies, including bed alarm and nonskid footwear, were maintained and no falls occurred.  OT session focused on self-care and home management was completed, with activities adjusted to patient tolerance.  Skin integrity was supported by frequent repositioning, incontinence pads, and positioning supports, with no new breakdowns noted.  Patient required moderate to maximum assistance for ADLs and hygiene, and overall remained engaged in care and interventions.    Optimal Comfort and Wellbeing: Pain fluctuated throughout the shift, with interventions including acetaminophen  and gabapentin  resulting in periods of decreased pain, though higher pain was reported during OT; patient declined some interventions but participated in activities as tolerated.    Absence of Infection Signs and Symptoms: Temperature remained stable and within normal range, and contact precautions were maintained; no temperature spikes were documented.    Absence of Fall and Fall-Related Injury: Fall reduction program and bed alarm were consistently maintained, with hourly visual checks and use of nonskid footwear and assistive devices; no falls or injuries were reported during the shift.    Improved Ability to Complete Activities of Daily Living: Moderate to maximum assistance was required for hygiene, bathing, and dressing, with OT session focused on self-care and home management; patient and RN agreed to OT session and activities were graded to tolerance.    Skin Health and Integrity: Skin inspection revealed intact areas where visualized, with ongoing use of incontinence pads, frequent repositioning, and positioning supports; some bruising and abrasions were noted but no new breakdowns documented.

## 2024-10-13 NOTE — Unmapped (Signed)
 Since you [place] you may be eligible for Personal Care Services [facility]) to help with tasks like, dressing, bathing, etc.  When you get home you will need to ask a doctor that you are working with (like your primary care doctor) to fill out a [id number] form which they can get online:  [email or web address]  The [facility] evaluator will need to come to your home to evaluate your needs.

## 2024-10-13 NOTE — Unmapped (Addendum)
 Adult Nutrition Assessment Note    Visit Type: RN Consult  Reason for Visit: Per Admission Nutrition Screen (Adult)    NUTRITION INTERVENTIONS and RECOMMENDATION     Recommend Ensure Plus HP once daily  Encouraged to order more PRN with meals  Weigh weekly  Document PO intakes    NUTRITION ASSESSMENT     Current nutrition therapy is appropriate and progressing toward meeting nutritional needs at this time.   Patient would benefit from start of oral supplement to better meet nutritional needs.  Patient drinks Ensure shake once, sometimes twice daily  Eating better, majority of each meal and not experiencing nausea and vomiting since admission      NUTRITIONALLY RELEVANT DATA     HPI & PMH:    83 y.o. female whose presentation is complicated by  HTN, HLD, T2DM, stage 2 CKD, COPD, emphysema, CAD, osteoarthritis, a-fib (on Eliquis ), CHF, chronic PE, schizoaffective disorder, and bladder cancer (s/p bladder removal w/ ileal-conduit with urostomy)  that presented to [facility] Department with vomiting and diarrhea x 1 day     Nutrition History:   10/14- Patient's daughter reports patient poor PO intake for 1-2 days due to nausea and vomiting and diarrhea. Patient has been feeling better and eating better since admission. Patient ate well prior to those 1-2 days of poor PO intake. Patient drinks Ensure once, sometimes twice daily at home per daughter report    Medications:  Nutritionally pertinent medications reviewed and evaluated for potential food and/or medication interactions.    Miralax , Senna, Lasix   Labs:   Nutritionally pertinent labs reviewed.     Nutritional Needs:   Healthy balance of carbohydrate, protein, and fat.     Anthropometric Data:  Height: 157.5 cm (5' 2.01)   Admission weight: 55.8 kg (123 lb)  Last recorded weight: 56.4 kg (124 lb 5.4 oz) (10/13/24)  IBW: 49.99 kg  BMI: Body mass index is 22.74 kg/m??.   Usual Body Weight: 135 lb per patient report  Weight Assessment: 10 lb loss over 1 week or 1 month, likely fluid related as patient requires lasix   Wt Readings from Last 20 Encounters:   10/13/24 56.4 kg (124 lb 5.4 oz)   10/08/24 55.8 kg (123 lb)   09/25/24 61.2 kg (135 lb)   09/16/24 56.9 kg (125 lb 6.4 oz)   09/02/24 61.2 kg (135 lb)   08/13/24 63.5 kg (140 lb)   07/22/24 63.5 kg (140 lb)   07/15/24 61.2 kg (135 lb)   07/09/24 61.2 kg (135 lb)   06/25/24 61.3 kg (135 lb 2.3 oz)   06/22/24 61.3 kg (135 lb 2.3 oz)   06/09/24 65.8 kg (145 lb)   05/27/24 61.2 kg (135 lb)   03/29/24 68.5 kg (151 lb 0.2 oz)   02/26/24 61.2 kg (135 lb)   02/18/24 61.2 kg (135 lb)   02/10/24 62.7 kg (138 lb 3.2 oz)   01/31/24 61.2 kg (135 lb)   01/15/24 60.8 kg (134 lb 1.6 oz)   01/06/24 64.3 kg (141 lb 12.8 oz)       Malnutrition Assessment:  Malnutrition Assessment using AND/ASPEN or GLIM Clinical Characteristics:    Non-severe (Moderate) Protein-Calorie Malnutrition in the context of chronic illness (10/13/24 1251)  Fat Loss: Moderate  Muscle Loss: Severe  Malnutrition Score: 2             Nutrition Focused Physical Exam:  Nutrition Focused Physical Exam:  Fat Areas Examined  Upper Arm: Moderate loss  Muscle Areas Examined  Temple: Moderate loss  Clavicle: Severe loss  Acromion: Severe loss  Patellar: Severe loss  Anterior Thigh: Severe loss              Nutrition Evaluation  Overall Impressions: Severe muscle loss, Moderate fat loss (10/13/24 1251)     Care plan:  Completed    Current Nutrition:  Oral intake   Nutrition Orders            Supplement Adult; Ensure Plus High Protein (High Calorie/High Protein); # of Products PER Serving: 1 At [employer] starting at 10/15 1200    Nutrition Therapy Regular/House starting at 10/14 0106          Nutritionally Pertinent Allergies, Intolerances, Sensitivities, and/or Cultural/Religious Restrictions:  none identified at this time     GOALS and EVALUATION     Patient to meet 75% or greater of nutritional needs via combination of meals, snacks, and/or oral supplements within admission.  - New    Motivation, Barriers, and Compliance:  Evaluation of motivation, barriers, and compliance completed. No concerns identified at this time.     Discharge Planning:   Monitor for potential discharge needs with multi-disciplinary team.         Follow-Up Parameters:   1-2 times per week (and more frequent as indicated)    Arleta Blanch, RD, LDN

## 2024-10-13 NOTE — Unmapped (Signed)
 PHYSICAL THERAPY  Evaluation (10/13/24 1711)          Patient Name:  Tammy Braun       Medical Record Number: [id number]   Date of Birth: 08-Sep-1941  Sex: Female        Post-Discharge Physical Therapy Recommendations:  PT Post Acute Discharge Recommendations: 5x weekly, Low intensity (vs 3x with 24/7 supervision for safety during transfers)   Equipment Recommendation  PT DME Recommendations: Defer to post acute (if pt goes home, no DME needs, has transport chair, hospital bed, RW, BSC, quad cane)          Treatment Diagnosis: Abnormalities of gait and mobility, Difficulty in walking, Generalized muscle weakness, Unsteadiness on feet        ASSESSMENT  Problem List: Decreased mobility, Fall risk, Decreased endurance, Pain, Decreased safety awareness, Impaired balance, Decreased strength, Decreased skin integrity, Decreased range of motion, Impaired ADLs, Impaired sensation, Impaired vision      Assessment : Tammy Braun is a 83 y.o. female whose presentation is complicated by  HTN, HLD, T2DM, stage 2 CKD, COPD, emphysema, CAD, osteoarthritis, a-fib (on Eliquis ), CHF, chronic PE, schizoaffective disorder, and bladder cancer (s/p bladder removal w/ ileal-conduit with urostomy) that presented to [facility] Emergency Department with vomiting and diarrhea x 1 day.     Pt requires assist with ADLS, sponge bathes, dtr handles IADLS; ambulates household distances with RW and spc, limited community ambulation, uses transport chair for community mobility;has ramp to enter/exit home, denies falls, lives with her dtr and grandson.     Pt presents to PT below functional baseline with impairments in rom, strength, balance, and ambulation impacting functional mobility. Pt required Mod/Max A for supine<>sit EOB; Mod A , RW, for sit<>stand from EOB xx 3; stood 2.5 minutes for pericare 2/2 to diarrhea; ambulated 2 ft with RW, Mod A.     After clinical presentation, review of body systems, exam findings, personal factors and contributing co-morbidities, pt presents as a moderate complexity case. AM-PAC 5 raw score is 10/20, pt is 65.88% impaired in basic mobility. Pt will benefit from acute PT to address impairments, improve safety and functional mobility. PT recommend 5xL vs 3x with 24 hr supervision.      Today's Interventions: PT Eval. Pt zi:mnoz of PT, POC, fall risks precautions,self-care/home management;bed mobility, transfers, standing balance, pericare     Personal Factors/Comorbidities Present: 3+ factors   Examination of Body systems: 4+ elements  Clinical Presentation: Evolving    Eval Complexity : Moderate Complexity     Activity Tolerance: Limited by fatigue, Other (Limited by bowel incontinence)       PLAN  Planned Frequency of Treatment: Plan of Care Initiated: 10/13/24  1-2x per day Weekly Frequency: 3-4 days per week  Planned Treatment Duration: 10/26/24     Planned Interventions: Education (Patient/Family/Caregiver), Gait training, Self-care / Home Management training, Therapeutic Exercise, Therapeutic Activity     Goals:   Patient and Family Goals: Go home and resume PLOF.     SHORT GOAL #1: Pt will perform bed mobility with Min A.               Time Frame : 2 weeks  SHORT GOAL #2: Pt will perform functional transfers with Min A, RW.              Time Frame : 2 weeks  SHORT GOAL #3: Pt will ambulate 20 ft or greater with RW, CGA.  Time Frame : 2 weeks                       Long Term Goal #1: AM-PAC 20/20 within  4 weeks.        Prognosis:  Good  Positive Indicators: Family support, willingness to participate  Barriers to Discharge: Gait instability, Impaired Balance, Inability to safely perform ADLS, Functional strength deficits, Endurance deficits     SUBJECTIVE  Communication Preference: Verbal     Patient reports: Agreeable to PT. I'll do my best, but my stools are lose.  Pain Comments: no c/o pain     Services patient receives prior to admission: PT  Prior Functional Status: Pt requires assist with ADLS, sponge bathes, dtr handles IADLS; ambulates household distances with RW and spc, limited community ambulation, uses transport chair for community mobility;has ramp to enter/exit home, denies falls, lives with her dtr and grandson.  Living Situation  Living Environment: House  Lives With: Daughter (grandson)  Home Living: One level home, Shower chair with back, Grab bars in shower, Handicapped height toilet, Ramped entrance, Walk-in shower, Bedside commode  Caregiver Identified?: Yes (dtr)  Caregiver Availability: 24 hours  Caregiver Ability: Limited lifting  Caregiver Identified?: Yes (dtr)   Equipment available at home: Rolling walker, Wheelchair-manual, Bedside commode, Oxygen, Hospital Bed, Transport Wheelchair        Past Medical History[1]         Social History     Tobacco Use    Smoking status: Former     Current packs/day: 0.50     Types: Cigarettes    Smokeless tobacco: Never    Tobacco comments:     10cpd, quit about 2 months ago (April 2025)   Substance Use Topics    Alcohol use: No     Alcohol/week: 0.0 standard drinks of alcohol       Past Surgical History[2]          Family History[3]     Allergies: Lisinopril, Losartan , and Hctz [hydrochlorothiazide ]                  Objective Findings  Precautions / Restrictions  Precautions: Isolation precautions, Falls precautions (Contact)  Weight Bearing Status: Non-applicable  Required Braces or Orthoses: Non-applicable  Precautions / Restrictions comments: Contact        Equipment / Environment: Vascular access (PIV, TLC, Port-a-cath, PICC), Supplemental oxygen, Urostomy (1 L )     Vitals/Orthostatics : VSS per EPIC,NAD, asymptomatic     Cognition: WFL, Follows 2-step commands  Cognition comment: Answered questions appropriately  Orientation: Oriented x4  Visual/Perception: Wears Glasses/Contacts all the time  Hearing: Hearing aids not present, Hearing impairment     Skin Inspection: Poor skin integrity  Skin Inspection comment: unable to fully assess buttocks with Meriplex on sacrum     Upper Extremities  UE ROM: Left Impaired/Limited, Right Impaired/Limited  RUE ROM Impairment: Limited AROM, Limited PROM  LUE ROM Impairment: Limited AROM, Limited PROM  UE Strength: Right Impaired/Limited, Left Impaired/Limited  RUE Strength Impairment: Reduced strength  LUE Strength Impairment: Reduced strength  UE comment: Bilateral shoulder flex limited to ~ 85-90 degrees, 3-/5 in available rom    Lower Extremities  LE ROM: Right Impaired/Limited, Left Impaired/Limited  RLE ROM Impairment: Limited AROM, Limited PROM  LLE ROM Impairment: Limited AROM, Limited PROM  LE Strength: Right Impaired/Limited, Left Impaired/Limited  RLE Strength Impairment: Reduced strength  LLE Strength Impairment: Reduced strength  LE comment: Bilateral knee flexion  limited, 3-/5 grossly, decreased functional strength, deconditioned    Face, Cervical and Trunk ROM  Face ROM: WFL     Coordination: Not tested  Proprioception: Not tested  Sensation: Impaired (pt able to feel pressure, but it is diminished B feet)  Posture: Rounded shoulders, Forward head    Static Sitting-Level of Assistance: Minimum assistance  Dynamic Sitting-Level of Assistance: Minimum assistance  Sitting Balance comments: pt able to sit EOB with CGA/Min A, trunk weakness, vc's for use of bedrail for support, posterior lean    Static Standing-Level of Assistance: Moderate assistance  Dynamic Standing - Level of Assistance: Moderate assistance  Standing Balance comments: w/RW      Bed Mobility: Rolling Right, Rolling Left, Supine to Sit, Sit to Supine  Rolling right assistance level: Moderate assist, patient does 50-74%  Rolling left assistance level: Moderate assist, patient does 50-74%  Supine to Sit assistance level: Moderate assist, patient does 50-74%  Sit to Supine assistance level: Moderate assist, patient does 50-74%  Bed Mobility comments: rolling R/L for pericare Mod A x2, diarrhea; supine<>sit Mod/Max A supine<>sit EOB to manage LEs and trunk, extra time     Transfers: Sit to Stand  Sit to Stand assistance level: Moderate assist, patient does 50-74%  Transfer comments: w/RW, Mod A sit<>stand, extra time for transition, unsteady, vc's to increase BOS; standing w/RW 2.5 minutes for pericare d/t diarrhea      Gait Level of Assistance: Moderate assist, patient does 50-74%  Gait Assistive Device: Rolling walker  Gait Distance Ambulated (ft): 2 ft  Skilled Treatment Performed: w/RW, Mod A to take 2 steps forward, 4 steps along EOB, vc's for sequencing     Stairs: NA, has ramp            Endurance: poor,dconditioned    Patient at end of session: All needs in reach, In bed, Notified Nurse, Lines intact, Staff present    Physical Therapy Session Duration  PT Individual [mins]: 60          AM-PAC 5 click  Help currently need turning over In bed?: A lot - Maximum/Moderate Assistance  Help currently needed sitting down/standing up from chair with arms? : A lot - Maximum/Moderate Assistance  Help currently needed moving from supine to sitting on edge of bed?: A lot - Maximum/Moderate Assistance  Help currently needed moving to and from bed from wheelchair?: A lot - Maximum/Moderate Assistance  Help currently needed walking in a hospital room?: A lot - Maximum/Moderate Assistance      Basic Mobility Score 5 click: 10    Score (in points): % of Functional Impairment, Limitation, Restriction  5: 100% impaired, limited, restricted  6-7: At least 80%, but less than 100% impaired, limited restricted  8-11: At least 60%, but less than 80% impaired, limited restricted  12-16: At least 40%, but less than 60% impaired, limited restricted  17-18: At least 20%, but less than 40% impaired, limited restricted  19: At least 1%, but less than 20% impaired, limited restricted  20: 0% impaired, limited restricted    'AM-PAC' forms are Copyright protected by [employer] [facility]         I attest that I have reviewed the above information.  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    Chronic kidney disease     Depression, psychotic    (CMS-HCC)     Diabetes mellitus (CMS-HCC)     in past    Emphysema of lung (CMS-HCC)     Financial difficulties     Frail elderly     Hearing impairment     Hernia     History of transfusion     Hyperlipidemia     Hypertension     Impaired mobility     NSTEMI (non-ST elevated myocardial infarction) (CMS-HCC) 12/25/2023    Osteoporosis     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/08/2015    Visual impairment    [2]   Past Surgical History:  Procedure Laterality Date    ABDOMINAL SURGERY      BLADDER SURGERY      BREAST CYST EXCISION      CHEMOTHERAPY  2012    bladder    GALLBLADDER SURGERY      stone removal    ILEOSTOMY  2012    PR COLONOSCOPY FLX DX W/COLLJ SPEC WHEN PFRMD N/A 02/09/2015    Procedure: COLONOSCOPY, FLEXIBLE, PROXIMAL TO SPLENIC FLEXURE; DIAGNOSTIC, W/WO COLLECTION SPECIMEN BY BRUSH OR WASH;  Surgeon: Eleanor CHRISTELLA Sorrel, MD;  Location: [facility] [place];  Service: Gastroenterology    PR EXPLORATORY OF ABDOMEN N/A 02/28/2024    Procedure: EXPLORATORY LAPAROTOMY, EXPLORATORY CELIOTOMY WITH OR WITHOUT BIOPSY(S);  Surgeon: Hollice Toribio Birmingham, MD;  Location: [facility];  Service: General Surgery    PR RECONSTR TOTAL SHOULDER IMPLANT Left 04/08/2018    Procedure: ARTHROPLASTY, GLENOHUMERAL JOINT; TOTAL SHOULDER(GLENOID & PROXIMAL HUMERAL REPLACEMENT(EG, TOTAL SHOULDER);  Surgeon: Alease Carrie Oyster, MD;  Location: [facility] OR [facility];  Service: Ortho Sports Medicine    PR REOPEN RECENT ABD EXPLORATORY Midline 03/01/2024    Procedure: REOPENING OF RECENT LAPAROTOMY;  Surgeon: Vicci Janas CROME., MD;  Location: OR [facility];  Service: Trauma    PR REOPEN RECENT ABD EXPLORATORY N/A 03/03/2024    Procedure: REOPENING OF RECENT LAPAROTOMY;  Surgeon: Quinn Tinnie Verla Dasie, MD;  Location: OR [facility];  Service: Trauma    PR SIGMOIDOSCOPY,BIOPSY N/A 03/11/2015    Procedure: SIGMOIDOSCOPY, FLEXIBLE; WITH BIOPSY, SINGLE OR MULTIPLE;  Surgeon: Sandrea CHRISTELLA Mood, MD;  [facility];  Service: Gastroenterology    PR XCAPSL CTRC RMVL INSJ IO LENS PROSTH W/O ECP Left 06/05/2022    Procedure: EXTRACAPSULAR CATARACT REMOVAL W/INSERTION OF INTRAOCULAR LENS PROSTHESIS, MANUAL OR MECHANICAL TECHNIQUE WITHOUT ENDOSCOPIC CYCLOPHOTOCOAGULATION;  Surgeon: Sinthu Sivarama Ranjan, MD;  Location: [facility] OR [facility];  Service: Ophthalmology    PR XCAPSL CTRC RMVL INSJ IO LENS PROSTH W/O ECP Right 06/19/2022    Procedure: EXTRACAPSULAR CATARACT REMOVAL W/INSERTION OF INTRAOCULAR LENS PROSTHESIS, MANUAL OR MECHANICAL TECHNIQUE WITHOUT ENDOSCOPIC CYCLOPHOTOCOAGULATION;  Surgeon: Sinthu Sivarama Ranjan, MD;  Location: [facility] OR [facility];  Service: Ophthalmology   [3]   Family History  Problem Relation Age of Onset    Breast cancer Daughter 45    Diabetes Mother     Glaucoma Father     Colon cancer Neg Hx     Ovarian cancer Neg Hx     Endometrial cancer Neg Hx     Anesthesia problems Neg Hx     Bleeding Disorder Neg Hx

## 2024-10-13 NOTE — Unmapped (Signed)
 Family Medicine Inpatient Progress Note    Assessment & Plan:   Tammy Braun is a 83 y.o. female whose presentation is complicated by  HTN, HLD, T2DM, stage 2 CKD, COPD, emphysema, CAD, osteoarthritis, a-fib (on Eliquis ), CHF, chronic PE, schizoaffective disorder, and bladder cancer (s/p bladder removal w/ ileal-conduit with urostomy)  that presented to [facility] Emergency Department with vomiting and diarrhea x 1 day .     Principal Problem:    Gastroenteritis  Active Problems:    Osteoarthrosis neck    DM (diabetes mellitus), type 2 with neurological complications (CMS-HCC)    Hypertension    Tobacco use disorder    Chronic renal disease, stage 2, mildly decreased glomerular filtration rate (GFR) between 60-89 mL/min/1.73 square meter    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)    Schizoaffective disorder    (CMS-HCC)    Pulmonary hypertension    (CMS-HCC)    Atrial fibrillation, unspecified type    (CMS-HCC)    Chronic right-sided heart failure (CMS-HCC)    CAD (coronary artery disease)      Active Problems    #Diarrhea   #Emesis  #Diaphragmatic hernia  #Abdominal pain  Patient initially reported having diarrhea with dark brown/orange liquid output, abdominal pain, and non-bloody emesis x 1 day, all of which she says are improving. She initially had sharp, abdominal pain that she now reports is much less. After receiving Zofran  and Tylenol  while in the ED, she was able to tolerate a cracker and water . Minimal N/V. On exam, patient has hyperactive bowel sounds and mild abdominal distension. Lab work and bedside POCUS does not demonstrate dehydration. CT showed unchanged large diaphragmatic hernia with intrathoracic stomach, bowel, and pancreas. Also showed mildly dilated loops of bowel with fluid. Differential diagnosis includes bacterial vs viral gastroenteritis, SBO, abdominal edema, food poisoning. Consider worsening hiatal hernia as abdominal contents are likely edematous d/t diarrhea and irritation, leading to pressure on diaphragmatic hernia, which could be contributing to acute abdominal pain. Holding off on IV fluids iso R heart diastolic dysfunction and clinical stability in terms of dehydration. Her EKG showed Qtc prolongation which can be a side effect of Zofran .    - Plan to discharge today (10/14) given able to tolerate PO intake.   -Continue to encourage PO eating and drinking    -Advance regular diet as tolerated   -Tylenol  for pain PRN  - Hold Zofran  2/2 Qtc prolongation on EKG     #HyperK  #QT Prolongation on EKG  Patient presented with asymptomatic hyperkalemia. K+ 5.9 10/13. K+ corrected to normal level (4.4) 10/14. Recently started Spironolactone . Likely medication induced. 10/13 QT intervals prolonged at 472, then 484. After holding spironolactone +zofran , EKG shows QT interval downtrended to 468.   -Hold Spironolactone  and CTM K+ levels  - Repeat EKG  -BMP, Mg daily  -Telemetry  -Continue Lasix       #CAD  #Right Heart Failure  #Swelling of Foot   Patient reports daily swelling of foot, that are improved with elevation. Takes home Lasix . Does not report taking Aspirin . Last Echo (03/11/24) showed EF 70%. Received Lasix  20 mg iv in the ED which improved LE and abdominal edema. On exam, trace peripheral edema bilaterally, which is baseline according to family. Pro-BNP is 4504. Troponin 27. EKG showed new T wave inversions. Bedside ED POCUS demonstrates mild pericardial effusion, however no cardiac dysfunction and normal RV.   -Continue home Lasix  20 mg.   -Hold home Spironolactone  12.5 mg daily  Chronic Problems     #COPD  #emphysema  Patient not currently complaining of SOB or increased work of breathing. Breathing comfortably on 1 L of oxygen, and this is her baseline oxygen requirement at night. SpO2 is 97 on 1 L in the ED. CXR showed confluent airspace opacity involving the RLL. There were no concerns for PE or pneumonia.  -Continue 1 L of oxygen nightly   -Continue albuterol  90 mcg inhaler prn  -daily weights  -Encourage incentive spirometry  -Monitor for any signs of fever and virus.  -If wheezing, consider Duonebs as needed.     #AF  Patient currently asymptomatic. Denies CP and SOB. Normal cardiac exam.   -Continue home Eliquis  5 mg  -Continue home Diltiazem  120 mg daily     #Type 2 DM  Burning in arms and legs x 1 year. Blood glucose 164.   -Continue home Gabapentin  100 mg as needed.   -ssi if hyperglycemic while inpt.     #HTN  Blood pressure is 133/69.   -Hold home Spironolactone  12.5 mg in the setting of hyperkalemia.     #Osteoarthrosis of the neck  #Left shoulder dysfunction   Currently asymptomatic. History of left shoulder arthroplasty.  -Continue Cyclobenzaprine  5 mg daily.   -Consulted PT, recs pending     #Right foot wound  -Consider wound care consult  -Tylenol  500 mg q6h     #HLD  -Not currently on medications.      #Schizoaffective disorder  -Not currently on medications     #history of bladder cancer (s/p bladder removal w/ ileal-conduit with urostomy)   Changes urostomy q2h at baseline. Has changed them twice today, but output increasing and returning to baseline yellow color.  -monitor urine output  -CTM     # Tobacco use disorder: Patient currently smoking 1 cigarette daily. Has smoked for 66 years (1/2 PPD for 15 years)  - Tobacco cessation consult  - Nicotine  replacement therapy with patch, gum, lozenge PRN    Issues Impacting Complexity of Management:    Medical Decision Making: Reviewed records from the following unique sources: inpatient and outpatient records .    Daily Checklist:  Diet: Regular Diet  DVT PPx: Eliquis  5mg  q8hr  Code Status: Full Code  Dispo: Patient appropriate for Observation based on expectation at time of admission that period of observation will last less than two midnights    Team Contact Information:   Primary Team: Family Medicine [facility]  Primary Resident: Jinny Cleone Beal, MD  Medical Student: Katelin Devine  Resident's Pager: [facility] [phone])    Interval History:     Patient reports the abdominal pain, nausea, and vomiting are all improving. She has not passed a bowel movement since yesterday. Urine is light yellow and clear. Daughter reports that urine output is still less than baseline. She has a good appetite and reports that she is ready to eat, drink, and go home as soon as possible.     All other systems were reviewed and are negative except as noted in the HPI    Objective:   Temp:  [36.5 ??C (97.7 ??F)-36.8 ??C (98.2 ??F)] 36.8 ??C (98.2 ??F)  Pulse:  [70-80] 77  SpO2 Pulse:  [68-81] 74  Resp:  [16-27] 19  BP: (117-162)/(65-90) 152/78  SpO2:  [79 %-100 %] 100 %    Gen: NAD, converses, O & A x 4  Eyes: Sclera anicteric, EOMI grossly normal   HENT: Atraumatic, normocephalic  Neck: Trachea midline  Heart: RRR, trace edema; bilateral pedal pulses  Lungs: CTAB, no wheezes, crackles in bilateral upper lung fields  Abdomen: Soft, ND, hyperactive bowel sounds; urostomy in the RLQ with yellow output  Extremities:bilateral swelling in feet, with pain in the right foot.   Neuro: Grossly symmetric, non-focal    Skin:  No rashes. Right foot wound not visualized during exam.  Psych: Alert, oriented    Katelin Eevine, MS3    I attest that I have reviewed the student note and that the components of the history of the present illness, the physical exam, and the assessment and plan documented were performed by me or were performed in my presence by the student where I verified the documentation and performed (or re-performed) the exam and medical decision making.     Jinny GORMAN Cleone Burney, MD, PGY1  October 13, 2024 3:02 PM

## 2024-10-13 NOTE — Unmapped (Signed)
 Care Management  Initial Transition Planning Assessment    Patient lives with her daughter, [name].  Has WC, hoyer lift, hospital bed in the home.  No additional equipment and no services in the home. Patient and family agreeable to [facility] or SNF depending on recommendations.     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  Past Surgical History:   has a past surgical history that includes Bladder surgery; Gallbladder surgery; Ileostomy (2012); Abdominal surgery; pr colonoscopy flx dx w/collj spec when pfrmd (N/A, 02/09/2015); pr sigmoidoscopy,biopsy (N/A, 03/11/2015); Chemotherapy (2012); Breast cyst excision; pr reconstr total shoulder implant (Left, 04/08/2018); pr xcapsl ctrc rmvl insj io lens prosth w/o ecp (Left, 06/05/2022); pr xcapsl ctrc rmvl insj io lens prosth w/o ecp (Right, 06/19/2022); pr exploratory of abdomen (N/A, 02/28/2024); pr reopen recent abd exploratory (Midline, 03/01/2024); and pr reopen recent abd exploratory (N/A, 03/03/2024).   Previous admit date: 06/13/2024    Primary Insurance- Payor: MEDICARE / Plan: MEDICARE PART A AND PART B / Product Type: *No Product type* /   Secondary Insurance - Secondary Insurance  MEDICAID [place]  Prescription Coverage -   Preferred Pharmacy - [id number] - [place], [place] - [address] [facility] [address]  [id number] - [place] - [address]  [facility] CENTRAL OUT-PT PHARMACY [facility]  [facility] SPECIALTY AND HOME DELIVERY PHARMAC[facility]  [facility]    Transportation home: Probation officer / Social Worker assessed the patient by : Telephone conversation with family  Orientation Level: Oriented X4  Functional level prior to admission: Partially Assisted  Who provides care at home?: Family member  Level of assistance required: Transferring, Dressing  Reason for referral: Discharge Planning    Contact/Decision Maker  Extended Emergency Contact Information  Primary Emergency Contact: Baldwin,[name]   [place]  Mobile Phone: [phone]  Relation: Daughter  Secondary Emergency Contact: Curtis,Betty   [place]  Mobile Phone: [phone]  Relation: Daughter    Legal Next of Kin / Guardian / POA / Advance Directives     HCDM (patient stated preference) (Active): Gwyneth [name] - Daughter - [phone]    Advance Directive (Medical Treatment)  Does patient have an advance directive covering medical treatment?: Patient has advance directive covering medical treatment, copy in chart.    Health Care Decision Maker [HCDM] (Medical & Mental Health Treatment)  Healthcare Decision Maker: HCDM documented in the HCDM/Contact Info section.  Information offered on HCDM, Medical & Mental Health advance directives:: Patient declined information.         Readmission Information    Have you been hospitalized  in the last 30 days?: No                                Did the following happen with your discharge?                                                     Patient Information  Lives with: Family members    Type of Residence: Private residence        Location/Detail: [place], [place]    Support Systems/Concerns: None    Responsibilities/Dependents at home?: No    Home Care services in place prior to admission?: No          Outpatient/Community Resources in place prior to admission: Clinic       Equipment Currently Used at Home: lift device, wheelchair, manual, hospital bed       Currently receiving outpatient dialysis?: No       Financial Information       Need for financial assistance?: No       Social Drivers of Health  Social Drivers of Health were addressed in provider documentation.  Please refer to patient history.  Social Drivers of Health     Food Insecurity: No Food Insecurity (10/13/2024)    Hunger Vital Sign     Worried About Running Out of Food in the Last Year: Never true     Ran Out of Food in the Last Year: Never true   Tobacco Use: Medium Risk (10/13/2024)    Patient History     Smoking Tobacco Use: Former     Smokeless Tobacco Use: Never     Passive Exposure: Not on file   Transportation Needs: No Transportation Needs (10/13/2024)    PRAPARE - Therapist, art (Medical): No     Lack of Transportation (Non-Medical): No   Alcohol Use: Not At Risk (08/12/2023)    Alcohol Use     How often do you have a drink containing alcohol?: Never     How many drinks containing alcohol do you have on a typical day when you are drinking?: 1 - 2     How often do you have 5 or more drinks on one occasion?: Never   Housing: Low Risk  (10/13/2024)    Housing     Within the past 12 months, have you ever stayed: outside, in a car, in a tent, in an overnight shelter, or temporarily in someone else's home (i.e. couch-surfing)?: No     Are you worried about losing your housing?: No   Physical Activity: Inactive (08/12/2023)    Exercise Vital Sign     Days of Exercise per Week: 0 days     Minutes of Exercise per Session: 0 min   Utilities: Low Risk  (01/06/2024)    Utilities     Within the past 12 months, have you been unable to get utilities (heat, electricity) when it was really needed?: No   Stress: Stress Concern Present (08/12/2023)    [employer] of Occupational Health - Occupational Stress Questionnaire     Feeling of Stress : To some extent   Interpersonal Safety: Not At Risk (10/12/2024)    Interpersonal Safety     Unsafe Where You Currently Live:  No     Physically Hurt by Anyone: No     Abused by Anyone: No   Substance Use: Not on file (08/12/2024)   Intimate Partner Violence: Not At Risk (07/09/2024)    Humiliation, Afraid, Rape, and Kick questionnaire     Fear of Current or Ex-Partner: No     Emotionally Abused: No     Physically Abused: No     Sexually Abused: No   Social Connections: Moderately Isolated (08/12/2023)    Social Connection and Isolation Panel     Frequency of Communication with Friends and Family: More than three times a week     Frequency of Social Gatherings with Friends and Family: Once a week     Attends Religious Services: More than 4 times per year     Active Member of [employer] or Organizations: No     Attends Banker Meetings: Never     Marital Status: Widowed   Physicist, medical Strain: Low Risk  (10/13/2024)    Overall Financial Resource Strain (CARDIA)     Difficulty of Paying Living Expenses: Not hard at all   Health Literacy: Low Risk  (04/06/2021)    Health Literacy     : Never   Internet Connectivity: No Internet connectivity concern identified (08/12/2023)    Internet Connectivity     Do you have access to internet services: Yes     How do you connect to the internet: Personal Device at home     Is your internet connection strong enough for you to watch video on your device without major problems?: Yes     Do you have enough data to get through the month?: Yes     Does at least one of the devices have a camera that you can use for video chat?: Yes       Complex Discharge Information    Is patient identified as a difficult/complex discharge?: No Interventions:       Discharge Needs Assessment  Concerns to be Addressed: discharge planning    Clinical Risk Factors: > 65, Multiple Diagnoses (Chronic)    Barriers to taking medications: No    Prior overnight hospital stay or ED visit in last 90 days: No              Anticipated Changes Related to Illness: none    Equipment Needed After Discharge: none    Discharge Facility/Level of Care Needs: other (see comments)    Readmission  Risk of Unplanned Readmission Score:  %  Predictive Model Details   No score data available for [facility] Risk of Unplanned Readmission     Readmitted Within the Last 30 Days? (No if blank)   Patient at risk for readmission?: No    Discharge Plan  Screen findings are: Discharge planning needs identified or anticipated (Comment).    Expected Discharge Date:     Expected Transfer from Critical Care:      Quality data for continuing care services shared with patient and/or representative?: Yes  Patient and/or family were provided with choice of facilities / services that are available and appropriate to meet post hospital care needs?: Yes   List choices in order highest to lowest preferred, if applicable. : tbd    Initial Assessment complete?: Yes

## 2024-10-13 NOTE — Unmapped (Incomplete)
 Physician Discharge Summary [facility]  [facility]  [address]  [place] [address]  Dept: [phone]  Loc: [phone]     Identifying Information:   Tammy Braun  19-Mar-1941  [id number]    Primary Care Physician: Elige [name], MD   Code Status: Full Code    Admit Date: 10/12/2024    Discharge Date: 10/13/2024     Discharge To: {LM DISCHARGE DESTINATION:29393}    Discharge Service: [facility] - FAM - Blue     Discharge Attending Physician: Vernell FORBES Remington, MD    Discharge Diagnoses:  Principal Problem:    Gastroenteritis  Active Problems:    Osteoarthrosis neck    DM (diabetes mellitus), type 2 with neurological complications (CMS-HCC)    Hypertension    Tobacco use disorder    Chronic renal disease, stage 2, mildly decreased glomerular filtration rate (GFR) between 60-89 mL/min/1.73 square meter    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    Saturating on 1 L in the ED, SpO2=97, home O2 requirement 1 L at night.     got diuresis in the ED over cf CHF exacerbation-abdominal distension and lower edema swelling improved.      Wt Readings from Last 12 Encounters:   10/12/24 55.8 kg (123 lb)   10/08/24 55.8 kg (123 lb)   09/25/24 61.2 kg (135 lb)   09/16/24 56.9 kg (125 lb 6.4 oz)   09/02/24 61.2 kg (135 lb)   08/13/24 63.5 kg (140 lb)   07/22/24 63.5 kg (140 lb)   07/15/24 61.2 kg (135 lb)   07/09/24 61.2 kg (135 lb)   06/25/24 61.3 kg (135 lb 2.3 oz)   06/22/24 61.3 kg (135 lb 2.3 oz)   06/09/24 65.8 kg (145 lb)        At the time of admission, there was an expectation that the patient was going to be hospitalized greater than two midnights, but the patient improved faster than expected prior to a two midnight stay because of *** (keep in note if patient admitted under inpatient status but stayed less than two midnights and you still feel the patient was appropriate for inpatient care, delete otherwise)    Touchbase with Outpatient Provider:  {Warm Handoff:61081:::1}    Procedures:  {LM PROCEDURES INPATIENT (Optional):31637}  No admission procedures for hospital encounter.  ______________________________________________________________________  Discharge Medications:     Your Medication List        ASK your doctor about these medications      acetaminophen  500 MG tablet  Commonly known as: TYLENOL   Take 1 tablet (500 mg total) by mouth every six (6) hours.     albuterol  90 mcg/actuation inhaler  Commonly known as: PROVENTIL  HFA;VENTOLIN  HFA  Inhale 2 puffs every four (4) hours as needed for wheezing.     blood-glucose meter kit  Disp. blood glucose meter kit preferred by patient's insurance. Check blood sugars as directed by provider. Dx: Diabetes, E11.9     calcium  carbonate-vitamin D3 600 mg-10 mcg (400  unit) per tablet  Commonly known as: CALCIUM  600 + D(3)  Take 1 tablet by mouth two (2) times a day.     cyclobenzaprine  5 MG tablet  Commonly known as: FLEXERIL   1 po bid prn severe miuscle spasms neck     diclofenac  sodium 1 % gel  Commonly known as: VOLTAREN   Apply 2 grams topically four (4) times a day.     dilTIAZem  180 MG 24 hr capsule  Commonly known as: CARDIZEM  CD  Take 1 capsule (180 mg total) by mouth daily.     doxycycline  100 MG tablet  Commonly known as: VIBRA -TABS  1 po q day for 7 days     DULoxetine  20 MG capsule  Commonly known as: CYMBALTA   Take 1 capsule (20 mg total) by mouth daily.     ELIQUIS  5 mg Tab  Generic drug: apixaban   Take 1 tablet (5 mg total) by mouth two (2) times a day.     fluPHENAZine  decanoate 25 mg/mL injection  Commonly known as: PROLIXIN   Inject 2 mL (50 mg total) into the muscle every thirty (30) days.     furosemide  20 MG tablet  Commonly known as: LASIX   Take 1 tablet (20 mg total) by mouth daily.     gabapentin  100 MG capsule  Commonly known as: NEURONTIN   Take 1 capsule (100 mg total) by mouth Three (3) times a day as needed (pain).     glucose blood test strip  Generic drug: blood sugar diagnostic  Disp test strips preferred by insurance plan. Testing qday, Dx: E11.9 (Type 2 DM- controlled)     inhalational spacing device Spcr  Commonly known as: E-Z SPACER  1 each by Miscellaneous route every four (4) hours as needed.     lancets Misc  Disp. lancets #30 or amount allowed, Testing once Qday. Dx: E11.9 (Diabetes- controlled)     MEDICAL SUPPLY ITEM  Entreal formula nutritionally supplement complete caloric density     MEDICAL SUPPLY ITEM  Compression stockings knee high     MEDICAL SUPPLY ITEM  Group 2 Power Wheelchair, Research scientist (life sciences) , Power Elevating Leg Rests, and Tour manager.     miscellaneous medical supply Misc  1 each by Miscellaneous route four (4) times a day.     polyethylene glycol 17 gram/dose powder  Commonly known as: GLYCOLAX   Dissolve one capful (17 grams) in 4 to 8oz of fluid and drink by mouth daily as directed.  Ask about: Should I take this medication?     senna 8.6 mg tablet  Commonly known as: SENNA  Take 2 tablets by mouth nightly.  Ask about: Should I take this medication?     spironolactone  25 MG tablet  Commonly known as: ALDACTONE   Take 1/2 tablet a day     underpads 2.6 X 2.9 feet Pads  1 each by Miscellaneous route two (2) times a day.              Allergies:  Lisinopril, Losartan , and Hctz [hydrochlorothiazide ]  ______________________________________________________________________  Pending Test Results (if blank, then none):  Pending Labs       Order Current Status    Urine Culture In process            Most Recent Labs:  {LM Labs d/c summary:57782}    Relevant Studies/Radiology (if blank, then none):  {LM Studies Pull In (Optional):57783}  ______________________________________________________________________  Discharge Instructions:  Appointments which have been scheduled for you      Oct 15, 2024 4:00 PM  (Arrive by 3:50 PM)  RETURN CONTINUITY with Juliene Chesley Row, MD  [facility] FAMILY MEDICINE [place] [facility]) [address]  [place] [address]  [phone]        Oct 28, 2024 12:30 PM  (Arrive by 12:15 PM)  RETURN PULMONARY with Katelynn M Smith, MD  [facility] PULMONARY SPECIALTY CL [place] [facility]) [address]  [facility] 1 through 4  [place] [address]  [phone]        Oct 30, 2024 10:30 AM  (Arrive by 10:15 AM)  OFFICE VISIT with Duwaine CHRISTELLA Nation, DO  [place] [facility] [place] [facility]) [address] [place] [address]  [phone]        Oct 30, 2024 11:30 AM  (Arrive by 11:20 AM)  NURSE VISIT with [facility] NURSE  [facility] PSYCHIATRY [facility] AT [facility] CENTER [facility]ION) [address]  [address]  [place] [address]  [phone]        Nov 10, 2024 11:40 AM  (Arrive by 11:30 AM)  RETURN CONTINUITY with Juliene Chesley Row, MD  [facility] FAMILY MEDICINE [place] [facility]) [address]  [place] [address]  [phone]        Jan 27, 2025 11:15 AM  (Arrive by 11:00 AM)  RETURN CARDIOLOGY with Donnice Beverley Chester, MD  [facility] CARDIOLOGY [place] [facility]ION) [address]  [facility] 1 through 4  [place] [address]  [phone]   Please remember to bring your most recent home blood pressure readings to your upcoming clinic visit. If you have questions, please call [phone].         Mar 04, 2025 3:00 PM  (Arrive by 2:30 PM)  RETURN  CONSULT with Sergio Mazzola Poli de Figueiredo, MD  [facility] GENERAL AND ACUTE CARE SURGERY [place] [facility]) [address]  [place] [address]  [phone]        Mar 16, 2025 1:00 PM  (Arrive by 12:45 PM)  US  RENAL COMPLETE with IC US  RM 1  [facility]) [address] [facility] [address] [address]  1st Floor  [place] [address]  [phone]   It is okay to eat, no fasting is required.  Adults - Drink 36 oz of fluid 1 hour prior to exam and do not void for 30 minutes prior to exam.  Children age 77 to 44 - Drink 16 oz of fluid 1 hour prior to exam and do not void for 30 minutes prior to exam.  Children ages 42-12 - Drink 6 to 8 oz of fluid 1 hour prior to exam and do not void for 30 minutes prior to exam.   Children under 5 - Well hydrate.             Mar 16, 2025 1:45 PM  (Arrive by 1:30 PM)  RETURN UROLOGY with Ronal Emi Bring, ANP  [facility] UROLOGY SERVICE [place] [facility] REGION) [address]  [facility] 1 through 4  [place] [address]  [phone]             ______________________________________________________________________  Discharge Day Services:  BP 139/75  - Pulse 65  - Temp 36.8 ??C (98.2 ??F) (  Oral)  - Resp 20  - Ht 157.5 cm (5' 2.01)  - Wt 56.4 kg (124 lb 5.4 oz)  - SpO2 96%  - BMI 22.74 kg/m??   {LM D/C Day Services:57784::Pt seen on the day of discharge and determined appropriate for discharge.}    GEN: well appearing, lying in bed, NAD ***  HEENT: NCAT, MMM. EOMI.  Neck: Supple.  CV: Regular rate and rhythm. No murmurs/rubs/gallops.  Pulm: CTAB. No wheezing, crackles, or rhonchi.  Abd: Flat.  Nontender. No guarding, rebound.  Normoactive bowel sounds.    Neuro: A&O x 3. No focal deficits.   Ext: No peripheral edema.  Palpable distal pulses.    Condition at Discharge: {condition:18240}    Length of Discharge: I spent {discharge 56mins:23311} than 30 mins in the discharge of this patient.

## 2024-10-14 LAB — BASIC METABOLIC PANEL
ANION GAP: 16 mmol/L — ABNORMAL HIGH (ref 5–14)
BLOOD UREA NITROGEN: 15 mg/dL (ref 9–23)
BUN / CREAT RATIO: 17
CALCIUM: 9.4 mg/dL (ref 8.7–10.4)
CHLORIDE: 98 mmol/L (ref 98–107)
CO2: 23.1 mmol/L (ref 20.0–31.0)
CREATININE: 0.88 mg/dL (ref 0.55–1.02)
EGFR CKD-EPI (2021) FEMALE: 66 mL/min/1.73m2 (ref >=60)
GLUCOSE RANDOM: 129 mg/dL (ref 70–179)
POTASSIUM: 4.4 mmol/L (ref 3.4–4.8)
SODIUM: 137 mmol/L (ref 135–145)

## 2024-10-14 LAB — CBC
HEMATOCRIT: 35.6 % (ref 34.0–44.0)
HEMOGLOBIN: 11.7 g/dL (ref 11.3–14.9)
MEAN CORPUSCULAR HEMOGLOBIN CONC: 32.9 g/dL (ref 32.0–36.0)
MEAN CORPUSCULAR HEMOGLOBIN: 29 pg (ref 25.9–32.4)
MEAN CORPUSCULAR VOLUME: 88.3 fL (ref 77.6–95.7)
MEAN PLATELET VOLUME: 7.8 fL (ref 6.8–10.7)
PLATELET COUNT: 274 10*9/L (ref 150–450)
RED BLOOD CELL COUNT: 4.03 10*12/L (ref 3.95–5.13)
RED CELL DISTRIBUTION WIDTH: 16.7 % — ABNORMAL HIGH (ref 12.2–15.2)
WBC ADJUSTED: 7.8 10*9/L (ref 3.6–11.2)

## 2024-10-14 LAB — TSH: THYROID STIMULATING HORMONE: 4.71 u[IU]/mL (ref 0.550–4.780)

## 2024-10-14 LAB — MAGNESIUM: MAGNESIUM: 1.8 mg/dL (ref 1.6–2.6)

## 2024-10-14 MED ORDER — SENNOSIDES 8.6 MG TABLET
ORAL_TABLET | 2 refills | 0.00000 days
Start: 2024-10-14 — End: ?

## 2024-10-14 MED ORDER — METOCLOPRAMIDE 5 MG TABLET
ORAL_TABLET | Freq: Three times a day (TID) | ORAL | 0 refills | 10.00000 days | Status: CN | PRN
Start: 2024-10-14 — End: 2025-01-12

## 2024-10-14 MED ORDER — POLYETHYLENE GLYCOL 3350 17 GRAM/DOSE ORAL POWDER
2 refills | 0.00000 days
Start: 2024-10-14 — End: ?

## 2024-10-14 MED ORDER — METOCLOPRAMIDE 10 MG TABLET
ORAL_TABLET | Freq: Three times a day (TID) | ORAL | 2 refills | 30.00000 days | Status: CN
Start: 2024-10-14 — End: 2025-01-12

## 2024-10-14 MED ADMIN — senna (SENOKOT) tablet 2 tablet: 2 | ORAL | @ 01:00:00

## 2024-10-14 MED ADMIN — apixaban (ELIQUIS) tablet 5 mg: 5 mg | ORAL | @ 14:00:00

## 2024-10-14 MED ADMIN — furosemide (LASIX) tablet 20 mg: 20 mg | ORAL | @ 14:00:00

## 2024-10-14 MED ADMIN — ondansetron (ZOFRAN-ODT) disintegrating tablet 4 mg: 4 mg | ORAL | @ 01:00:00

## 2024-10-14 MED ADMIN — DULoxetine (CYMBALTA) DR capsule 20 mg: 20 mg | ORAL | @ 14:00:00

## 2024-10-14 MED ADMIN — apixaban (ELIQUIS) tablet 5 mg: 5 mg | ORAL | @ 01:00:00

## 2024-10-14 MED ADMIN — dilTIAZem (CARDIZEM CD) 24 hr capsule 180 mg: 180 mg | ORAL | @ 14:00:00

## 2024-10-14 MED ADMIN — magnesium sulfate 2gm/50mL IVPB: 2 g | INTRAVENOUS | @ 21:00:00 | Stop: 2024-10-14

## 2024-10-14 NOTE — Unmapped (Signed)
 [facility] Home Delivery Pharm[facility] Clinic Administered Medication Refill Coordination Note      NAME:Tammy Braun DOB: 1941/05/09      Medication: fluPHENAZine  decanoate 25 mg/mL injection (PROLIXIN )    Day Supply: 30 days      SHIPPING      Next delivery from [facility] [phone]) to [facility] for Marquis Diles is scheduled for 10/23/2024.    Clinic contact: [name]    Patient's next nurse visit for administration: 10/30/2024.    We will follow up with clinic monthly for standard refill processing and delivery.      [name]  [facility] Specialty and Home Delivery Pharmacy Specialty Technician

## 2024-10-14 NOTE — Unmapped (Signed)
 Family Medicine Inpatient Progress Note    Assessment & Plan:   Tammy Braun is a 83 y.o. female whose presentation is complicated by  HTN, HLD, T2DM, stage 2 CKD, COPD, emphysema, CAD, osteoarthritis, a-fib (on Eliquis ), CHF, chronic PE, schizoaffective disorder, and bladder cancer (s/p bladder removal w/ ileal-conduit with urostomy)  that presented to [facility] Emergency Department with vomiting, diarrhea and abdominal pain.     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 Takes home Lasix . Does not report taking Aspirin . Last Echo (03/11/24) showed EF 70%. Received Lasix  20 mg iv in the ED which improved LE and abdominal edema. On exam, trace peripheral edema bilaterally, which is baseline according to family. Pro-BNP is 4504. Troponin 27. EKG showed new T wave inversions. Bedside ED POCUS demonstrates mild pericardial effusion, however no cardiac dysfunction and normal RV. New cough with deep breaths and pulmonary congestion with crackles appreciated on exam. Differential includes CHF fluid overload 2-2 holding spironolactone , atelectasis, pneumonia, COPD exacerbation. Fluid overload with accompanying atelectasis likely considering hx CHF and spironolactone  diuretic held. Pneumonia less likely due to absence of leukocytosis, afebrile, no SOB. COPD exacerbation less likely due to absence of SOB and sputum. PT recommends discharge to SNF, patient declines at this time, daughter may be final decision maker because patient unable to provide reason for not wanting to go to SNF.  -Increase home Lasix  to 20 mg BID 10/15  -CXR repeat  -RPP  -Hold home Spironolactone  12.5 mg daily    #Diarrhea   #Nausea/Emesis  #Diaphragmatic hernia  #Abdominal pain  Patient initially reported having diarrhea with dark brown/orange liquid output, abdominal pain, and non-bloody emesis x 1 day, all of which she says are improving. She initially had sharp, abdominal pain that she now reports is much less. After receiving Zofran  and Tylenol  while in the ED, she was able to tolerate a cracker and water . Minimal N/V. On exam, patient has hyperactive bowel sounds and mild abdominal distension. Lab work and bedside POCUS does not demonstrate dehydration. CT showed unchanged large diaphragmatic hernia with intrathoracic stomach, bowel, and pancreas. Also showed mildly dilated loops of bowel with fluid. Differential diagnosis includes bacterial vs viral gastroenteritis, SBO, abdominal edema, food poisoning. Consider worsening hiatal hernia as abdominal contents are likely edematous d/t diarrhea and irritation, leading to pressure on diaphragmatic hernia, which could be contributing to acute abdominal pain. Holding off on IV fluids iso R heart diastolic dysfunction and clinical stability in terms of dehydration. Infection unlikely due to absence of fever and leukocytosis. Diarrhea may be due to physiological dysregulation, overuse of laxatives, or new onset IBS.   -Hold Senna 2 tablets nightly  -  Continue Miralax   -Encourage PO eating and drinking    -Tylenol  for pain PRN  - Zofran  for nausea PRN   Switch to Metoclopramide on DC   Encourage Ginger tea intake     #HyperK  #QT Prolongation on EKG  Patient presented with asymptomatic hyperkalemia. K+ 5.9 10/13. K+ corrected to normal level (4.4) 10/14. Recently started Spironolactone . Likely medication induced. 10/13 QT intervals prolonged at 472, then 484. Her EKG showed persistent Qtc prolongation, likely medication-induced, but down trended after holding spironolactone +zofran .  -Hold Spironolactone  and CTM K+ levels  -Repeat EKG  -BMP, Mg daily  -Telemetry  -Continue Lasix       Chronic Problems     # ACD  Long history of low RBC, low hematocrit  -Recommend outpatient monitoring and management.    #COPD  #emphysema  Patient not currently complaining of SOB or increased work of breathing. Breathing comfortably on 1 L of oxygen, and this is her baseline oxygen requirement at night. SpO2 is 97 on 1 L in the ED. CXR showed confluent airspace opacity involving the RLL. There were no concerns for PE or pneumonia. O2 sat continues to range 95-97. Hold supplemental O2 and monitor O2 sat to maintain in 88-92% range.   -Hold 1 L of oxygen nightly   -Continue albuterol  90 mcg inhaler prn  -daily weights  -Encourage incentive spirometry  -Monitor for any signs of fever and virus.  -If wheezing, consider Duonebs as needed.     #AF  Patient currently asymptomatic. Denies CP and SOB. Normal cardiac exam.   -Continue home Eliquis  5 mg  -Continue home Diltiazem  120 mg daily     #Type 2 DM  Burning in arms and legs x 1 year. Blood glucose 164.   -Continue home Gabapentin  100 mg as needed.   -ssi if hyperglycemic while inpt.     #HTN  Blood pressure is 133/69.   -Hold home Spironolactone  12.5 mg in the setting of hyperkalemia.     #Osteoarthrosis of the neck  #Left shoulder dysfunction   Currently asymptomatic. History of left shoulder arthroplasty.  -Continue Cyclobenzaprine  5 mg daily.   -Consulted PT, appreciate recs: Education (Patient/Family/Caregiver), Gait training, Self-care / Home Management training, Therapeutic Exercise, Therapeutic Activity      #Right foot wound  -Consider wound care consult  -Tylenol  500 mg q6h     #HLD  -Not currently on medications.      #Schizoaffective disorder  -Not currently on medications     #history of bladder cancer (s/p bladder removal w/ ileal-conduit with urostomy)   Changes urostomy q2h at baseline. Has changed them twice today, but output increasing and returning to baseline yellow color.  -CTM urine output     # Tobacco use disorder: Patient currently smoking 1 cigarette daily. Has smoked for 66 years (1/2 PPD for 15 years)  - Tobacco cessation consult  - Nicotine  replacement therapy with patch, gum, lozenge PRN    Issues Impacting Complexity of Management:    Medical Decision Making: Reviewed records from the following unique sources: inpatient and outpatient records .    Daily Checklist:  Diet: Regular Diet  DVT PPx: Eliquis  5mg  q8hr  Code Status: Full Code  Dispo: Patient appropriate for Observation based on expectation at time of admission that period of observation will last less than two midnights    Team Contact Information:   Primary Team: Family Medicine [facility]  Primary Resident: Jinny Cleone Beal, MD  Medical Student: Katelin Devine  Resident's Pager: [facility] [phone])    Interval History:     Patient and daughter report that diarrhea and nausea returned last night, after bowel movements resumed. Patient reports feeling whoozy and daughter reports that she is lower energy than usual. She has stomach pain that is relieved by bowel movements. She has a new cough with respiration but no trouble with breathing.     All other systems were reviewed and are negative except as noted in the HPI    Objective:   Temp:  [35.6 ??C (96.1 ??F)-36.8 ??C (98.2 ??F)] 36.4 ??C (97.5 ??F)  Pulse:  [67-71] 71  Resp:  [16-18] 18  BP: (133-159)/(71-92) 133/71  SpO2:  [95 %-100 %] 100 %    Gen: NAD, converses, O & A x 4, appears fatigued compared to yesterday  Eyes: Sclera anicteric, EOMI grossly normal   HENT: Atraumatic, normocephalic  Neck: Trachea midline  Heart: RRR, trace edema; bilateral pedal pulses  Lungs: CTAB, new congestion, wet cough, bilateral crackles, cough with deep breaths  Abdomen: Soft, ND, hyperactive bowel sounds; urostomy in the RLQ with yellow output  Extremities:bilateral swelling in feet, with pain in the right foot.   Neuro: Grossly symmetric, non-focal    Skin:  No rashes. Right foot wound not visualized during exam.  Psych: Alert, oriented    Katelin Devine, MS3    I attest that I have reviewed the student note and that the components of the history of the present illness, the physical exam, and the assessment and plan documented were performed by me or were performed in my presence by the student where I verified the documentation and performed (or re-performed) the exam and medical decision making.     Jinny Cleone Beal, PGY1  October 13, 2024 3:02 PM

## 2024-10-14 NOTE — Unmapped (Signed)
 Shift Summary  MASD on buttocks was managed with frequent repositioning, pressure reduction, and silicone foam dressing, which remained clean and intact.   Fall reduction program and bed alarm were maintained, with no falls or injuries reported.   Contact precautions and isolation protocols were followed throughout the shift.   Moderate assistance was provided for mobility and hygiene needs, with no change in independence.   Vitals remained stable and no acute changes were noted during the shift.     Absence of Hospital-Acquired Illness or Injury: No new hospital-acquired injuries documented; fall reduction program and bed alarm were maintained throughout the shift, and isolation precautions were consistently followed.     Absence of Infection Signs and Symptoms: Contact precautions were maintained and temperature remained below 37??C, with no documentation of fever or other infection-related symptoms.     Absence of Fall and Fall-Related Injury: Fall reduction interventions, including bed alarm and hourly visual checks, were consistently implemented and no falls were documented.     Improved Ability to Complete Activities of Daily Living: Moderate assistance was required for mobility, hygiene, bathing, and oral care throughout the shift, with no change in level of independence.     Skin Health and Integrity: Moisture-associated dermatitis (MASD) on the buttocks was present and managed with frequent repositioning, pressure reduction techniques, and silicone foam dressing, with dressing remaining clean, dry, and intact.

## 2024-10-14 NOTE — Unmapped (Signed)
 Patient alert and orientated x 3. However she is forgetful at times. IV access and medications delayed due to poor venous access. IV placed by VIR with ultrasound guidance. Patient reports no complaint/concerns of pain at this time. She has had multiple episodes of diarrhea today, MD aware, holding laxatives.   Problem: Adult Inpatient Plan of Care  Goal: Absence of Hospital-Acquired Illness or Injury  Outcome: Shift Focus  Intervention: Identify and Manage Fall Risk  Recent Flowsheet Documentation  Taken 10/14/2024 1600 by Cleatus Fonda CROME, RN  Safety Interventions:   fall reduction program maintained   low bed   nonskid shoes/slippers when out of bed  Taken 10/14/2024 1400 by Cleatus Fonda CROME, RN  Safety Interventions:   fall reduction program maintained   low bed   nonskid shoes/slippers when out of bed  Taken 10/14/2024 1200 by Cleatus Fonda CROME, RN  Safety Interventions:   fall reduction program maintained   low bed   nonskid shoes/slippers when out of bed  Taken 10/14/2024 1000 by Cleatus Fonda CROME, RN  Safety Interventions:   fall reduction program maintained   low bed   nonskid shoes/slippers when out of bed  Taken 10/14/2024 0800 by Cleatus Fonda CROME, RN  Safety Interventions:   fall reduction program maintained   low bed   nonskid shoes/slippers when out of bed  Goal: Rounds/Family Conference  Outcome: Shift Focus     Problem: Infection  Goal: Absence of Infection Signs and Symptoms  Outcome: Shift Focus     Problem: Fall Injury Risk  Goal: Absence of Fall and Fall-Related Injury  Outcome: Shift Focus  Intervention: Promote Injury-Free Environment  Recent Flowsheet Documentation  Taken 10/14/2024 1600 by Cleatus Fonda CROME, RN  Safety Interventions:   fall reduction program maintained   low bed   nonskid shoes/slippers when out of bed  Taken 10/14/2024 1400 by Cleatus Fonda CROME, RN  Safety Interventions:   fall reduction program maintained   low bed   nonskid shoes/slippers when out of bed  Taken 10/14/2024 1200 by Cleatus Fonda CROME, RN  Safety Interventions:   fall reduction program maintained   low bed   nonskid shoes/slippers when out of bed  Taken 10/14/2024 1000 by Cleatus Fonda CROME, RN  Safety Interventions:   fall reduction program maintained   low bed   nonskid shoes/slippers when out of bed  Taken 10/14/2024 0800 by Cleatus Fonda CROME, RN  Safety Interventions:   fall reduction program maintained   low bed   nonskid shoes/slippers when out of bed     Problem: Self-Care Deficit  Goal: Improved Ability to Complete Activities of Daily Living  Outcome: Shift Focus

## 2024-10-15 LAB — BASIC METABOLIC PANEL
ANION GAP: 13 mmol/L (ref 5–14)
BLOOD UREA NITROGEN: 13 mg/dL (ref 9–23)
BUN / CREAT RATIO: 15
CALCIUM: 9.3 mg/dL (ref 8.7–10.4)
CHLORIDE: 99 mmol/L (ref 98–107)
CO2: 23.6 mmol/L (ref 20.0–31.0)
CREATININE: 0.86 mg/dL (ref 0.55–1.02)
EGFR CKD-EPI (2021) FEMALE: 68 mL/min/1.73m2 (ref >=60)
GLUCOSE RANDOM: 141 mg/dL (ref 70–179)
POTASSIUM: 4.5 mmol/L (ref 3.4–4.8)
SODIUM: 136 mmol/L (ref 135–145)

## 2024-10-15 LAB — MAGNESIUM: MAGNESIUM: 2 mg/dL (ref 1.6–2.6)

## 2024-10-15 MED ORDER — APIXABAN 2.5 MG TABLET
ORAL_TABLET | Freq: Two times a day (BID) | ORAL | 2 refills | 30.00000 days | Status: CP
Start: 2024-10-15 — End: ?
  Filled 2024-10-16: qty 60, 30d supply, fill #0

## 2024-10-15 MED ORDER — UNDERPADS 2.6 X 2.9 FEET
Freq: Two times a day (BID) | 3 refills | 0.00000 days | Status: CP
Start: 2024-10-15 — End: ?

## 2024-10-15 MED ORDER — POLYETHYLENE GLYCOL 3350 17 GRAM/DOSE ORAL POWDER
ORAL | 2 refills | 0.00000 days | Status: CP
Start: 2024-10-15 — End: ?
  Filled 2024-10-16: qty 510, 30d supply, fill #0

## 2024-10-15 MED ORDER — DILTIAZEM CD 120 MG CAPSULE,EXTENDED RELEASE 24 HR
ORAL_CAPSULE | Freq: Every day | ORAL | 0 refills | 30.00000 days | Status: CP
Start: 2024-10-15 — End: ?

## 2024-10-15 MED ORDER — NADOLOL 40 MG TABLET
ORAL_TABLET | Freq: Every day | ORAL | 0 refills | 30.00000 days | Status: CP
Start: 2024-10-15 — End: 2025-01-13

## 2024-10-15 MED ORDER — SENNOSIDES 8.6 MG TABLET
ORAL_TABLET | ORAL | 2 refills | 0.00000 days | Status: CP
Start: 2024-10-15 — End: ?
  Filled 2024-10-16: qty 60, 30d supply, fill #0

## 2024-10-15 MED ADMIN — furosemide (LASIX) tablet 20 mg: 20 mg | ORAL | @ 12:00:00 | Stop: 2024-10-15

## 2024-10-15 MED ADMIN — acetaminophen (TYLENOL) tablet 650 mg: 650 mg | ORAL

## 2024-10-15 MED ADMIN — apixaban (ELIQUIS) tablet 5 mg: 5 mg | ORAL | @ 12:00:00 | Stop: 2024-10-15

## 2024-10-15 MED ADMIN — DULoxetine (CYMBALTA) DR capsule 20 mg: 20 mg | ORAL | @ 12:00:00 | Stop: 2024-10-15

## 2024-10-15 MED ADMIN — apixaban (ELIQUIS) tablet 5 mg: 5 mg | ORAL

## 2024-10-15 MED ADMIN — acetaminophen (TYLENOL) tablet 650 mg: 650 mg | ORAL | @ 13:00:00 | Stop: 2024-10-15

## 2024-10-15 MED ADMIN — dilTIAZem (CARDIZEM CD) 24 hr capsule 180 mg: 180 mg | ORAL | @ 12:00:00 | Stop: 2024-10-15

## 2024-10-15 NOTE — Unmapped (Signed)
 Shift Summary  Acetaminophen  was administered for left arm pain, resulting in a rapid decrease in pain score from 7 to 0.   Magnesium  sulfate was discontinued at the beginning of the shift.   Fall prevention strategies, including bed alarm and scheduled toileting, were consistently maintained with no reported falls.   Isolation precautions and skin protection interventions were maintained throughout the shift.   Overall, comfort improved and safety interventions were consistently implemented.     Absence of Hospital-Acquired Illness or Injury: No new hospital-acquired injuries or illnesses documented; skin protection measures and positioning were consistently maintained throughout the shift.     Optimal Comfort and Wellbeing: Left arm pain was reported as aching and discomfort at the start of the shift, with acetaminophen  administered and pain score decreasing from 7 to 0 after intervention.     Absence of Infection Signs and Symptoms: Temperature remained stable and within normal limits, and isolation precautions were maintained; sepsis risk score decreased slightly and then remained low.     Absence of Fall and Fall-Related Injury: Fall prevention interventions, including bed alarm, hourly visual checks, and scheduled toileting, were consistently implemented with no documented falls.     Improved Ability to Complete Activities of Daily Living: Moderate assistance was required for mobility, hygiene, and bathing, with no significant changes in level of assistance noted during the shift.

## 2024-10-15 NOTE — Unmapped (Signed)
 Family Medicine Inpatient Progress Note    Assessment & Plan:   Tammy Braun is a 83 y.o. female whose presentation is complicated by  HTN, HLD, T2DM, stage 2 CKD, COPD, emphysema, CAD, osteoarthritis, a-fib (on Eliquis ), CHF, chronic PE, schizoaffective disorder, and bladder cancer (s/p bladder removal w/ ileal-conduit with urostomy)  that presented to [facility] Emergency Department with vomiting, diarrhea, and abdominal pain.     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 Takes home Lasix . Does not report taking Aspirin . Last Echo (03/11/24) showed EF 70%. Received Lasix  20 mg iv in the ED which improved LE and abdominal edema. On exam, trace peripheral edema bilaterally, which is baseline according to family. Pro-BNP is 4504. Troponin 27. EKG showed new T wave inversions. Bedside ED POCUS demonstrates mild pericardial effusion, however no cardiac dysfunction and normal RV. New cough with deep breaths and pulmonary congestion with crackles appreciated on exam. Differential includes CHF fluid overload 2-2 holding spironolactone , atelectasis, pneumonia, COPD exacerbation. Fluid overload with accompanying atelectasis likely considering hx CHF and spironolactone  diuretic held. Pneumonia less likely due to absence of leukocytosis, afebrile, no SOB. COPD exacerbation less likely due to absence of SOB and sputum. PT recommends discharge to SNF, patient declines at this time, daughter may be final decision maker because patient unable to provide reason for not wanting to go to SNF. CXR repeat shows mild improvement of pulmonary congestion since initial presentation. RPP negative.  -Increase home Lasix  to 20 mg BID 10/15  -Hold home Spironolactone  12.5 mg daily    #Diarrhea   #Nausea/Emesis  #Diaphragmatic hernia  #Abdominal pain  Patient initially reported having diarrhea with dark brown/orange liquid output, abdominal pain, and non-bloody emesis x 1 day, all of which she says are improving. She initially had sharp, abdominal pain that she now reports is much less. After receiving Zofran  and Tylenol  while in the ED, she was able to tolerate a cracker and water . Minimal N/V. On exam, patient has hyperactive bowel sounds and mild abdominal distension. Lab work and bedside POCUS does not demonstrate dehydration. CT showed unchanged large diaphragmatic hernia with intrathoracic stomach, bowel, and pancreas. Also showed mildly dilated loops of bowel with fluid. Differential diagnosis includes bacterial vs viral gastroenteritis, SBO, abdominal edema, food poisoning. Consider worsening hiatal hernia as abdominal contents are likely edematous d/t diarrhea and irritation, leading to pressure on diaphragmatic hernia, which could be contributing to acute abdominal pain. Holding off on IV fluids iso R heart diastolic dysfunction and clinical stability in terms of dehydration. Infection unlikely due to absence of fever and leukocytosis. Diarrhea may be due to physiological dysregulation, overuse of laxatives, or new onset  IBS.   -Hold Senna 2 tablets nightly  -Continue Miralax   -Encourage PO eating and drinking    -Tylenol  for pain PRN  - Zofran  for nausea PRN   Switch to Metoclopramide on DC   Encourage Ginger tea intake     #HyperK  #QT Prolongation on EKG  Patient presented with asymptomatic hyperkalemia. K+ 5.9 10/13. K+ corrected to normal level (4.4) 10/14. Recently started Spironolactone . Likely medication induced. 10/13 QT intervals prolonged at 472, then 484. Her EKG showed persistent Qtc prolongation, likely medication-induced, but down trended after holding spironolactone +zofran . Repeat EKG showed persistent QT prolongation.  -Hold Spironolactone  and CTM K+ levels  -BMP, Mg x1  -Continue Lasix    -Will curbside Cardiology about EKG readings     Chronic Problems     # ACD  Long history of low RBC, low hematocrit  -Recommend outpatient monitoring and management.    #COPD  #emphysema  Patient not currently complaining of SOB or increased work of breathing. Breathing comfortably on 1 L of oxygen, and this is her baseline oxygen requirement at night. SpO2 is 97 on 1 L in the ED. CXR showed confluent airspace opacity involving the RLL. There were no concerns for PE or pneumonia. O2 sat continues to range 95-97. Hold supplemental O2 and monitor O2 sat to maintain in 88-92% range.   -Hold 1 L of oxygen nightly   -Continue albuterol  90 mcg inhaler prn  -daily weights  -Encourage incentive spirometry  -Monitor for any signs of fever and virus.  -If wheezing, consider Duonebs as needed.     #AF  Patient currently asymptomatic. Denies CP and SOB. Normal cardiac exam.   -Adjust home Eliquis  5 mg to 2.5 mg  -Continue home Diltiazem  120 mg daily     #Type 2 DM  Burning in arms and legs x 1 year. Blood glucose 164.   -Continue home Gabapentin  100 mg as needed.   -ssi if hyperglycemic while inpt.     #HTN  Blood pressure is 133/69.   -Hold home Spironolactone  12.5 mg in the setting of hyperkalemia.     #Osteoarthrosis of the neck  #Left shoulder dysfunction   Currently asymptomatic. History of left shoulder arthroplasty.  -Continue Cyclobenzaprine  5 mg daily.   -Consulted PT, appreciate recs: Education (Patient/Family/Caregiver), Gait training, Self-care / Home Management training, Therapeutic Exercise, Therapeutic Activity      #Right foot wound  -Consider wound care consult  -Tylenol  500 mg q6h     #HLD  -Not currently on medications.      #Schizoaffective disorder  -Not currently on medications     #history of bladder cancer (s/p bladder removal w/ ileal-conduit with urostomy)   Changes urostomy q2h at baseline. Has changed them twice today, but output increasing and returning to baseline yellow color.  -CTM urine output     # Tobacco use disorder: Patient currently smoking 1 cigarette daily. Has smoked for 66 years (1/2 PPD for 15 years)  - Tobacco cessation consult  - Nicotine  replacement therapy with patch, gum, lozenge PRN    Issues Impacting Complexity of Management:    Medical Decision Making: Reviewed records from the following unique sources: inpatient and outpatient records .    Daily Checklist:  Diet: Regular Diet  DVT PPx: Eliquis  5mg  q8hr  Code Status: Full Code  Dispo: Patient appropriate for Observation based on expectation at time of admission that period of observation will last less than two midnights    Team Contact Information:   Primary  Team: Family Medicine [facility]  Primary Resident: Jinny Cleone Beal, MD  Medical Student: Katelin Devine  Resident's Pager: [facility] [phone]    Interval History:     Patient and daughter report that diarrhea and nausea returned last night but has not worsened. She feels ready to go home. Has no chest pain or SOB. Cough has mildly improved but has not felt dizzy when getting up or chest pain.    All other systems were reviewed and are negative except as noted in the HPI    Objective:   Temp:  [36.4 ??C (97.5 ??F)-36.7 ??C (98.1 ??F)] 36.7 ??C (98.1 ??F)  Pulse:  [57-72] 61  Resp:  [16-18] 16  BP: (138-153)/(71-81) 138/75  SpO2:  [88 %-99 %] 96 %    Gen: NAD, converses, O & A x 4, appears fatigued compared to yesterday  Eyes: Sclera anicteric, EOMI grossly normal   HENT: Atraumatic, normocephalic  Neck: Trachea midline  Heart: RRR, trace edema; bilateral pedal pulses  Lungs: new congestion, wet cough with deep breaths  Abdomen: Soft, ND, hyperactive bowel sounds; urostomy in the RLQ with yellow output  Extremities:bilateral swelling in feet, with pain in the right foot.   Neuro: Grossly symmetric, non-focal    Skin:  No rashes. Right foot wound not visualized during exam.  Psych: Alert, oriented    Katelin Devine, MS3    I attest that I have reviewed the student note and that the components of the history of the present illness, the physical exam, and the assessment and plan documented were performed by me or were performed in my presence by the student where I verified the documentation and performed (or re-performed) the exam and medical decision making.     Jinny Cleone Beal, PGY1  October 13, 2024 3:02 PM

## 2024-10-15 NOTE — Unmapped (Signed)
 Shift Summary  Acetaminophen  was administered in the morning for headache pain, which was initially severe but later resolved with no further complaints.   Infection prevention and isolation precautions were maintained at regular intervals, with no documentation of new infection-related symptoms.   Fall reduction strategies, including hourly checks and bed alarm, were consistently implemented throughout the shift.   Patient required moderate to maximum assistance for hygiene and mobility, and remained agreeable to physical therapy interventions.   Overall, patient remained stable with no new hospital-acquired injuries or illnesses documented during the shift.     Absence of Hospital-Acquired Illness or Injury: No new hospital-acquired injuries or illnesses documented during the shift; fall reduction program and bed alarm were consistently maintained, and skin protection interventions such as absorbent and incontinence pads were utilized throughout the day.     Optimal Comfort and Wellbeing: Headache pain was reported as gradually worsening and rated 8/10 in the morning, acetaminophen  was administered, and by midday, no complaints of pain were documented and patient was observed sleeping.     Absence of Infection Signs and Symptoms: Temperature increased slightly but remained within a narrow range, aseptic technique and infection prevention measures were maintained, and isolation precautions were consistently followed.     Absence of Fall and Fall-Related Injury: Fall reduction program, hourly visual checks, and bed alarm were maintained throughout the shift, with no documentation of falls or injuries.     Improved Ability to Complete Activities of Daily Living: Patient required moderate assistance for hygiene and bathing, and maximum/moderate assistance for mobility tasks; patient remained agreeable to physical therapy and used a front wheel walker for mobility.

## 2024-10-15 NOTE — Unmapped (Signed)
 Physician Discharge Summary HBR  1 BT[facility]  [address]  [place] [address]  Dept: [phone]  Loc: [phone]     Identifying Information:   Tammy [place]  [birth year]  [place]    Primary Care Physician: Tammy [name], MD   Code Status: Full Code    Admit Date: 10/12/2024    Discharge Date: 10/15/2024     Discharge To: Home with Home Health and/or PT/OT    Discharge Service: HBR - FAM - Blue     Discharge Attending Physician: Tammy FORBES Remington, MD    Discharge Diagnoses:  Principal Problem:    Gastroenteritis  Active Problems:    Osteoarthrosis neck    DM (diabetes mellitus), type 2 with neurological complications (CMS-HCC)    Hypertension    Tobacco use disorder    Chronic renal disease, stage 2, mildly decreased glomerular filtration rate (GFR) between 60-89 mL/min/1.73 square meter    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 Elige: repeat EKG and place ziopatch  [ ]  consider additional daily dose of lasix  (20 mg BID) based on weight, lower extremity edema, or respiratory symptoms, especially if unable to safely take spironolactone    [ ]  gave daughter instructions to titrate bowel regimen on discharge if pt has diarrhea at home - please make sure she and patient are aware of how to titrate miralax /senna/immodium  [ ]  consider GI referral for intermittent diarrhea if patient/daughter interested.   [ ]  CCM - patient's daughter interested in [place] and padded bedside commode for DME, inpatient CM informed her that these may not be covered by insurance but if you could assist with looking into this more, it would be much appreciated!     Tammy [place] was admitted for nausea and diarrhea, along with hyperkalemia (5.9) likely in the setting of recently starting spironolactone . Spironolactone  was held on admission. EKG did not demonstrate widened QRS or peaked T waves but did demonstrate QT prolongation. Patient received IV lasix  and K normalized the next morning.     Her nausea also improved and she tolerated oral food without ongoing vomiting. She had one episode of vomiting the first morning of admission but no further prior to discharge. She continued to have intermittent diarrhea during admission, thus bowel regimen was downtitrated prior to discharge. We provided recommendations for use of miralax /senna/immodium in the discharge instructions. Given the patient had negative C diff and GIPP one month ago from the ED, and had no leukocytosis, fever, or abdominal pain, and diarrhea was non-bloody, decision made not to repeat infectious testing. Discussed with daughter that they can pursue further workup through PCP with referral to GI or additional stool studies if interested, but that diarrhea not profound enough to lead to dehydration or electrolyte abnormalities, so still safe to discharge.     Her QT interval remained prolonged on EKG and telemetry monitoring despite normalizaiton of electrolytes. TSH checked and also normal. QT prolonging meds held (anti-emetics) and QT still did not improve after holding for >  24 hours. She is on prolixin  injections for her schizoaffective disorder but this is not a new medication. Cardiology was consulted and recommended initiation of nadolol 40 mg and placement of a ziopatch at PCP follow up appointment to monitor PVC burden and QT interval. Their service will also arrange sooner follow up with her primary cardiologist.           [name] with Outpatient Provider:  Warm Handoff: Completed on 10/15/24 by Dr. Vernell [place] (Attending) via Phone    Procedures:  None  No admission procedures for hospital encounter.  ______________________________________________________________________  Discharge Medications:     Your Medication List        STOP taking these medications      doxycycline  100 MG tablet  Commonly known as: VIBRA -TABS     spironolactone  25 MG tablet  Commonly known as: ALDACTONE             START taking these medications      nadolol 40 MG tablet  Commonly known as: CORGARD  Take 1 tablet (40 mg total) by mouth daily.            CHANGE how you take these medications      apixaban  2.5 mg Tab  Commonly known as: ELIQUIS   Take 1 tablet (2.5 mg total) by mouth two (2) times a day.  What changed:   medication strength  how much to take     dilTIAZem  120 MG 24 hr capsule  Commonly known as: CARDIZEM  CD  Take 1 capsule (120 mg total) by mouth daily.  What changed:   medication strength  how much to take     polyethylene glycol 17 gram/dose powder  Commonly known as: GLYCOLAX   Mix 1 capful (17 grams) in 4 to 8 ounces of liquid and drink by mouth daily. Use instructions from provider for further titration depending on bowel patterns.  What changed:   how much to take  how to take this  when to take this  additional instructions     senna 8.6 mg tablet  Commonly known as: SENNA  Take 2 tablets by mouth nightly for 3 nights if no bowel movement in 3 days. Follow further titration instructions from MD.  What changed:   how much to take  how to take this  when to take this  additional instructions            CONTINUE taking these medications      acetaminophen  500 MG tablet  Commonly known as: TYLENOL   Take 1 tablet (500 mg total) by mouth every six (6) hours.     albuterol  90 mcg/actuation inhaler  Commonly known as: PROVENTIL  HFA;VENTOLIN  HFA  Inhale 2 puffs every four (4) hours as needed for wheezing.     blood-glucose meter kit  Disp. blood glucose meter kit preferred by patient's insurance. Check blood sugars as directed by provider. Dx: Diabetes, E11.9     calcium  carbonate-vitamin D3 600 mg-10 mcg (400 unit) per tablet  Commonly known as: CALCIUM  600 + D(3)  Take 1 tablet by mouth two (2) times a day.     cyclobenzaprine  5 MG tablet  Commonly known as: FLEXERIL   1 po bid prn severe miuscle spasms neck     diclofenac  sodium 1 % gel  Commonly known as: VOLTAREN   Apply 2 grams topically four (4) times a day.     DULoxetine  20 MG capsule  Commonly known as: CYMBALTA   Take 1 capsule (20 mg total) by  mouth daily.     fluPHENAZine  decanoate 25 mg/mL injection  Commonly known as: PROLIXIN   Inject 2 mL (50 mg total) into the muscle every thirty (30) days.     furosemide  20 MG tablet  Commonly known as: LASIX   Take 1 tablet (20 mg total) by mouth daily.     gabapentin  100 MG capsule  Commonly known as: NEURONTIN   Take 1 capsule (100 mg total) by mouth Three (3) times a day as needed (pain).     glucose blood test strip  Generic drug: blood sugar diagnostic  Disp test strips preferred by insurance plan. Testing qday, Dx: E11.9 (Type 2 DM- controlled)     inhalational spacing device Spcr  Commonly known as: E-Z SPACER  1 each by Miscellaneous route every four (4) hours as needed.     lancets Misc  Disp. lancets #30 or amount allowed, Testing once Qday. Dx: E11.9 (Diabetes- controlled)     MEDICAL SUPPLY ITEM  Entreal formula nutritionally supplement complete caloric density     MEDICAL SUPPLY ITEM  Compression stockings knee high     MEDICAL SUPPLY ITEM  Group 2 Power Wheelchair, Research scientist (life sciences) , Power Elevating Leg Rests, and Tour manager.     miscellaneous medical supply Misc  1 each by Miscellaneous route four (4) times a day.     underpads 2.6 X 2.9 feet Pads  Use 1 each as directed two (2) times a day.              Allergies:  Lisinopril, Losartan , and Hctz [hydrochlorothiazide ]  ______________________________________________________________________  Pending Test Results (if blank, then none):        Most Recent Labs:  All lab results last 24 hours -   Recent Results (from the past 24 hours)   ECG 12 Lead    Collection Time: 10/15/24  9:06 AM   Result Value Ref Range    EKG Systolic BP  mmHg    EKG Diastolic BP  mmHg    EKG Ventricular Rate 67 BPM    EKG Atrial Rate 67 BPM    EKG P-R Interval 90 ms    EKG QRS Duration 78 ms    EKG Q-T Interval 498 ms    EKG QTC Calculation 526 ms    EKG Calculated P Axis 81 degrees    EKG Calculated R Axis 113 degrees    EKG Calculated T Axis 15 degrees    QTC Fredericia 517 ms   Basic Metabolic Panel    Collection Time: 10/15/24  1:02 PM   Result Value Ref Range    Sodium 136 135 - 145 mmol/L    Potassium 4.5 3.4 - 4.8 mmol/L    Chloride 99 98 - 107 mmol/L    CO2 23.6 20.0 - 31.0 mmol/L    Anion Gap 13 5 - 14 mmol/L    BUN 13 9 - 23 mg/dL    Creatinine 9.13 9.44 - 1.02 mg/dL    BUN/Creatinine Ratio 15     eGFR CKD-EPI (2021) Female 68 >=60 mL/min/1.32m2    Glucose 141 70 - 179 mg/dL    Calcium  9.3 8.7 - 10.4 mg/dL   Magnesium  Level    Collection Time: 10/15/24  1:02 PM   Result Value Ref Range    Magnesium  2.0 1.6 - 2.6 mg/dL       Relevant Studies/Radiology (if blank, then none):  ECG 12 Lead  Result Date: 10/15/2024  SINUS RHYTHM WITH SHORT PR INTERVAL WITH OCCASIONAL PREMATURE  VENTRICULAR BEATS RIGHT AXIS DEVIATION ANTEROSEPTAL INFARCT T WAVE ABNORMALITY, CONSIDER INFEROLATERAL ISCHEMIA PROLONGED QT ABNORMAL ECG WHEN COMPARED WITH ECG OF 14-Oct-2024 14:36, SIGNIFICANT CHANGES HAVE OCCURRED    ED POCUS  Result Date: 10/15/2024  Limited Cardiac Ultrasound (CPT 06691-73) Indication: A focused ultrasound exam of the heart was performed to evaluate for pericardial effusion, tamponade, severe hypovolemia, or gross abnormalities of cardiac anatomy or function in this patient. The ultrasound was performed with the following indications, as noted in the H&P: Other indications as noted in the H&P Identified structures: The pericardial sac, myocardium, and 4 chambers were identified using the following views: subxiphoid, parasternal long axis, parasternal short axis, apical 4-chamber, and IVC (long axis) Findings: Exam of the above structures revealed the following findings:  Pericardial effusion: Present  with a SMALL effusion   Pericardial tamponade: N/A  Global LV function: Normal  Right ventricular size: Normal   Signs of RV strain: N/A  IVC: Normal  Other findings: trace pleural effusion noted Limitations: None. Impression: No sonographic evidence of significant cardiac dysfunction, Normal RV, No sonographic evidence of volume depletion, and Pericardial effusion:  with a SMALL effusion Other: none Interpreted by: Lauraine KANDICE Hasten, DO Quality Assurance  After review of the point-of-care ultrasound performed in this case I assess the overall image quality as: Image quality: Minimal criteria met for diagnosis, all structures imaged well and diagnosis easily supported  The accuracy of interpretation of images as presented reflects a true positive. This study does  meet minimum criteria for credentialing and billing. Loa LITTIE Pinal, MD     ECG 12 Lead  Result Date: 10/14/2024  SINUS RHYTHM WITH FREQUENT PREMATURE VENTRICULAR BEATS ANTEROSEPTAL INFARCT , AGE UNDETERMINED ST & T WAVE ABNORMALITY, CONSIDER LATERAL ISCHEMIA PROLONGED QT ABNORMAL ECG WHEN COMPARED WITH ECG OF 14-Oct-2024 11:06, ANTEROSEPTAL INFARCT IS NOW PRESENT T WAVE INVERSION LESS EVIDENT IN INFERIOR LEADS T WAVE INVERSION LESS EVIDENT IN LATERAL LEADS    XR Chest 2 views  Result Date: 10/14/2024  EXAM: XR CHEST 2 VIEWS ACCESSION: [id number] UN REPORT DATE: 10/14/2024 12:08 PM CLINICAL INDICATION: PULMONARY CONGESTION  TECHNIQUE: PA and Lateral Chest Radiographs COMPARISON: XR CHEST PORTABLE 10/12/2024 FINDINGS: Bibasilar opacities are present. Mild peribronchial cuffing. No pleural effusion or pneumothorax. Significant opacification of the lower mediastinum due to large hiatal hernia containing the stomach and transverse colon better characterized on the recent CT abdomen and pelvis. Reverse total left shoulder arthroplasty.     Bibasilar opacities likely atelectasis due to very large hiatal hernia as better visualized on the recent CT abdomen pelvis. Superimposed airspace disease is cannot be exclude. Mild diffuse bronchial wall thickening could be infectious or inflammatory.     ECG 12 Lead  Result Date: 10/14/2024  SINUS RHYTHM WITH FREQUENT PREMATURE VENTRICULAR BEATS ST ELEVATION, CONSIDER INFERIOR INJURY OR ACUTE INFARCT PROLONGED QT ** ** ACUTE MI / STEMI ** ** CONSIDER RV INVOLVEMENT IN ACUTE INFERIOR INFARCT ABNORMAL ECG WHEN COMPARED WITH ECG OF 13-Oct-2024 09:25, QRS AXIS SHIFTED LEFT    ECG 12 Lead  Result Date: 10/13/2024  SINUS RHYTHM WITH FREQUENT PREMATURE VENTRICULAR BEATS POSSIBLE LEFT ATRIAL ENLARGEMENT SEPTAL INFARCT  (CITED ON OR BEFORE 20-Jun-2024) ST & T WAVE ABNORMALITY, CONSIDER INFERIOR ISCHEMIA ST & T WAVE ABNORMALITY, CONSIDER ANTERIOR ISCHEMIA ABNORMAL ECG WHEN COMPARED WITH ECG OF 12-Oct-2024 16:14, QUESTIONABLE CHANGE IN INITIAL FORCES OF ANTERIOR LEADS NONSPECIFIC T WAVE ABNORMALITY HAS REPLACED INVERTED T WAVES IN LATERAL LEADS Confirmed by Claudene Legions ([id number]) on 10/13/2024 10:20:53 AM  ECG 12 Lead  Result Date: 10/13/2024  SINUS RHYTHM WITH OCCASIONAL PREMATURE VENTRICULAR BEATS RIGHT AXIS DEVIATION POSSIBLE RIGHT VENTRICULAR HYPERTROPHY SEPTAL INFARCT  (CITED ON OR BEFORE 20-Jun-2024) T WAVE ABNORMALITY, CONSIDER INFEROLATERAL ISCHEMIA ABNORMAL ECG WHEN COMPARED WITH ECG OF 12-Oct-2024 19:26, QRS AXIS SHIFTED RIGHT T WAVE INVERSION NOW EVIDENT IN LATERAL LEADS    CT Abdomen Pelvis Wo Contrast  Result Date: 10/12/2024  EXAM: CT ABDOMEN PELVIS WO CONTRAST ACCESSION: [id number] UN REPORT DATE: 10/12/2024 7:01 PM CLINICAL INDICATION: 83 years old with abdominal pain  COMPARISON: CT abdomen pelvis 09/02/2024. TECHNIQUE: A spiral CT scan was obtained without IV contrast from the lung bases to the pubic symphysis.  Images were reconstructed in the axial plane. Coronal and sagittal reformatted images were also provided for further evaluation. Evaluation of the solid organs and vasculature is limited in the absence of intravenous contrast. FINDINGS: LOWER CHEST: Extensive coronary artery calcifications. Pulmonary artery dilation, similar to prior. Right hemidiaphragm hernia with intrathoracic loops of bowel and stomach, similar to prior. LIVER: Normal liver contour. No focal liver lesion on non-contrast examination. BILIARY: The gallbladder is surgically absent. No intrahepatic biliary ductal dilatation. SPLEEN: Normal in size and contour. PANCREAS: Normal pancreatic contour without signs of inflammation or gross ductal dilatation. ADRENAL GLANDS: Thickening of bilateral adrenal glands, similar to prior KIDNEYS/URETERS: Air within the bilateral renal pelvises, decreased from prior. Bilateral hydroureteronephrosis, similar to prior. Bilateral nonobstructive nephrolithiasis. Bilateral renal cysts, measuring up to 2.7 cm on the right and 2.0 cm on the left. Subcentimeter hypodensities are too small to characterize on CT. BLADDER: Right lower quadrant ileal conduit. REPRODUCTIVE ORGANS: Unremarkable. GI TRACT: Large hiatal hernia containing loops of bowel, pancreas, and stomach, similar to prior. Unchanged right parastomal hernia containing a loop of small bowel without evidence of obstruction. Multiple dilated loops of small bowel measuring up to 3.5 cm, similar to prior. There is normal caliber distal bowel containing gas. Colonic diverticulosis. PERITONEUM/RETROPERITONEUM AND MESENTERY: No free air. No ascites. No fluid collection. VASCULATURE: Normal caliber aorta. Otherwise, limited evaluation without contrast. Advanced atherosclerotic disease LYMPH NODES: No adenopathy. BONES and SOFT TISSUES: No aggressive osseous lesions. No focal soft tissue lesions. Advanced multilevel degenerative changes of the spine, worst at L4-L5 where there is intervertebral disc narrowing and grade 2 anterolisthesis of L4 and L5.     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 Decreased locules of gas within the bilateral renal pelvises. 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 Previous concern for right lower lobe opacity corresponds to enteric contents from intrathoracic viscus. Mildly dilated loops of bowel with fluid, which is similar to prior. This may be seen with diarrheal state.     XR Chest Portable  Result Date: 10/12/2024  EXAM: XR CHEST PORTABLE ACCESSION: [id number] UN REPORT DATE: 10/12/2024 6:58 PM CLINICAL INDICATION: DYSPNEA  TECHNIQUE: Single View AP Chest Radiograph. COMPARISON: None FINDINGS: Confluent airspace opacity involving the right lower lung.  No pleural effusion or pneumothorax. Normal heart size and mediastinal contours. Left shoulder arthroplasty.     Right lower lung airspace disease concerning for pneumonia or aspiration     ECG 12 Lead  Result Date: 10/12/2024  SINUS RHYTHM WITH OCCASIONAL PREMATURE VENTRICULAR BEATS PROLONGED QT LEFT ATRIAL ABNORMALITY POOR R WAVE PROGRESSION IN PRECORDIAL LEADS , CONSIDER ANTERIOR INFARCT  (CITED ON OR BEFORE 20-Jun-2024) NONSPECIFIC ST-T WAVE ABNORMALITIES AND T WAVE INVERSION , CONSIDER ISCHEMIA WHEN COMPARED WITH ECG OF 12-Oct-2024 13:39, NO SIGNIFICANT CHANGE WAS FOUND SUGGEST CLINICAL CORRELATION  Confirmed by Vinie Dunnings [phone]) on 10/12/2024 5:22:09 PM    ECG 12 Lead  Result Date: 10/12/2024  SINUS RHYTHM WITH FREQUENT PREMATURE VENTRICULAR BEATS LEFT ATRIAL ABNORMALITY POOR R WAVE PROGRESSION IN PRECORDIAL LEADS , CONSIDER ANTERIOR INFARCT NONSPECIFIC ST-T WAVE ABNORMALITIES AND T WAVE INVERSION , CONSIDER ISCHEMIA WHEN COMPARED WITH ECG OF 02-Sep-2024 16:29, ST-T WAVE ABNORMALITIES AND T WAVE INVERSION IS MORE EVIDENT IN CHEST LEADS SUGGEST CLINICAL CORRELATION Confirmed by Vinie Dunnings ([id number]) on 10/12/2024 1:55:44 PM   ______________________________________________________________________  Discharge Instructions:               Resources and Referrals       Commode      Length of Need: 99    Type of commode: Chair Comment - 3in1 - PADDED SEAT    Home Medical Equipment      Length of Need: 99    Specify: Camie Ip    Height: 157.5 cm (5' 2.01)     Weight: 57.3 kg (126 lb 5.2 oz)        Height: 157.5 cm (5' 2.01)     Weight: 57.3 kg (126 lb 5.2 oz)      Since you have full [place] Medicaid you may be eligible for Personal Care Services [facility]) to help with tasks like, dressing, bathing, etc.  When you get home you will need to ask a doctor that you are working with (like your primary care doctor) to fill out a [id number] form which they can get online:  [email or web address]  [facility] evaluator will need to come to your home to evaluate your needs.             Follow Up instructions and Outpatient Referrals     Ambulatory Referral to Home Health      Reason for referral: PT, OT, RN, Aide    Physician to follow patient's care: PCP    Disciplines requested:  Nursing  Physical Therapy  Occupational Therapy  Home Health Aide       Nursing requested: Ostomy Care    Ostomy type: urostomy    Ostomy location: RLQ    Ostomy care instructions: daughter says pt needs custom ostomy supplies    Physical Therapy requested: Evaluate and treat    Occupational Therapy Requested: Evaluate and treat        Appointments which have been scheduled for you      Oct 28, 2024 12:30 PM  (Arrive by 12:15 PM)  RETURN PULMONARY with Katelynn M Smith, MD  [facility] PULMONARY SPECIALTY CL [place] [facility] REGION) [address]  [facility] 1 through 4  [place] [address]  [phone]        Oct 30, 2024 10:30 AM  (Arrive by 10:15 AM)  OFFICE VISIT with Duwaine CHRISTELLA Nation, DO  [place] [facility] [place] [facility]) [address] [place] [address]  [phone]        Oct 30, 2024 11:30 AM  (Arrive by 11:20 AM)  NURSE VISIT with [facility] NURSE  [facility] PSYCHIATRY [facility] AT [facility] CENTER [facility] REGION) [address]  [address]  [place] [address]  [phone]        Nov 10, 2024 11:40 AM  (Arrive by 11:25 AM)  RETURN CONTINUITY with Juliene Chesley Row, MD  [facility] FAMILY MEDICINE  [facility]) [address]  [place] [address]  [phone]  Jan 06, 2025 9:50 AM  (Arrive by 9:25 AM)  NEW DIABETIC NEUROPATHY with Camellia Penne Schwab, DO  [facility] NEUROLOGY CLINIC [place] [address] [facility] REGION) [address]  [place] [address]  [phone]        Jan 27, 2025 11:15 AM  (Arrive by 11:00 AM)  RETURN CARDIOLOGY with Donnice Beverley Chester, MD  [facility] CARDIOLOGY [place] [facility] REGION) [address]  [facility] 1 through 4  [place] [address]  [phone]   Please remember to bring your most recent home blood pressure readings to your upcoming clinic visit. If you have questions, please call [phone].         Mar 04, 2025 3:00 PM  (Arrive by 2:30 PM)  RETURN  CONSULT with Sergio Mazzola Poli de Figueiredo, MD  [facility] GENERAL AND ACUTE CARE SURGERY [facility]) [address]  [place] [address]  [phone]        Mar 16, 2025 1:00 PM  (Arrive by 12:45 PM)  US  RENAL COMPLETE with IC US  RM 1  [facility]) [address]  [address]st Floor  [place] [address]  [phone]   It is okay to eat, no fasting is required.  Adults - Drink 36 oz of fluid 1 hour prior to exam and do not void for 30 minutes prior to exam.  Children age 41 to 49 - Drink 16 oz of fluid 1 hour prior to exam and do not void for 30 minutes prior to exam.  Children ages 66-12 - Drink 6 to 8 oz of fluid 1 hour prior to exam and do not void for 30 minutes prior to exam.   Children under 5 - Well hydrate.             Mar 16, 2025 1:45 PM  (Arrive by 1:30 PM)  RETURN UROLOGY with Ronal Emi Bring, ANP  [facility] UROLOGY SERVICE [place]  [facility] REGION) [address]  [facility] 1 through 4  [place] [address]  [phone]             ______________________________________________________________________  Discharge Day Services:  BP 141/84  - Pulse 71  - Temp 37.1 ??C (98.8 ??F) (Temporal)  - Resp 18  - Ht 157.5 cm (5' 2.01)  - Wt 57.3 kg (126 lb 5.2 oz)  - SpO2 96%  - BMI 23.10 kg/m??   Pt seen on the day of discharge and determined appropriate for discharge.    GEN: well appearing, lying in bed, NAD   HEENT: NCAT, MMM. EOMI.  Neck: Supple.  CV: Regular rate and rhythm. No murmurs/rubs/gallops.  Pulm: CTAB. No wheezing, crackles, or rhonchi.  Abd: Flat.  Nontender. No guarding, rebound.  Normoactive bowel sounds.    Neuro: A&O x 3. No focal deficits.   Ext: No peripheral edema.  Palpable distal pulses.    Condition at Discharge: fair    Length of Discharge: I spent greater than 30 mins in the discharge of this patient.

## 2024-10-15 NOTE — Unmapped (Signed)
 Copied from CRM #[id number]. Topic: Access To Clinicians - Req Clinic Call Back  >> Oct 15, 2024 11:59 AM Meghan P wrote:  Reason of the Call: Kate from [facility] calling to advise patient will be missing therapy this week due to being currently hospitalized. No call back necessary, just FYI.

## 2024-10-15 NOTE — Unmapped (Signed)
 Cardiology Consult Note    Requesting Attending Physician :  Vernell FORBES Remington, MD  Service Requesting Consult : [facility]     Date of Service: October 15, 2024 11:14 AM   Code Status:   Orders Placed This Encounter   Procedures    Full Code     Standing Status:   Standing     Number of Occurrences:   1         Patient ID: Tammy Braun is a 83 y.o. female with a past medical history significant for  HTN, HLD, T2DM, stage 2 CKD, COPD, emphysema, CAD, osteoarthritis, a-fib (on Eliquis ), CHF, chronic PE, schizoaffective disorder, and bladder cancer (s/p bladder removal w/ ileal-conduit with urostomy) who  is admitted for gastroenteritis.  Found to have prolonged QT on ECG; therefore, cardiology consulted to assist with evaluation and management.    Assessment/Plan:   Assessment & Plan  # QT interval prolongation  QT interval prolongation observed on ECG with intervals of 472 ms and 484 ms. QTC per Bazzet equation on recent ECG was noted to be ~507 msec. Appears to be type I QT prolongation. The patient has  no family history of sudden cardiac death. Potassium levels normalized after discontinuation of spironolactone , which likely contributed to hyperkalemia. Current medications reviewed, with no other contributors to QT prolongation identified. Patient is a high risk of VT due to prolonged QT interval and frequent PVC's. Nadolol is recommended to shorten the QT interval and reduce arrhythmia risk; unfortunately this medication is not on formulary here at [facility]; will start as outpatient.   - Resume home diltiazem  ER 120 mg daily.  - Start nadolol 40 mg once daily as outpatient.   - Discharge with a Zio patch to assess PVC burden and arrhythmias  - Trend BMP; Replace electrolytes per protocol in order to maintain K >/=4 and magnesium  >/=2.  - Follow up with primary cardiologist upon discharge.         Recommendations given to the primary team via phone.   Thank you for consulting medicine Cardiology. This patient was seen and discussed with the consult attending. Please see attending attestation for further insights into management. If any further questions or concerns arise please do not hesitate to contact us  via pager or epic chat.     History of Present Illness:     Reason for Consult:   Prolonged QT    Tammy Braun is a 83 y.o. female with a past medical history significant for  HTN, HLD, T2DM, stage 2 CKD, COPD, emphysema, CAD, osteoarthritis, a-fib (on Eliquis ), CHF, chronic PE, schizoaffective disorder, and bladder cancer (s/p bladder removal w/ ileal-conduit with urostomy)  who  is being seen at the request of Vernell FORBES Remington, MD for prolonged QT.     History of Present Illness  Tammy Braun is an 83 year old female with atrial fibrillation and coronary artery disease who presents with QT prolongation.    HPI obtained from both patient and her daughter who was at the bedside.     She has a history of atrial fibrillation and is currently on diltiazem , which was recently increased to 180 mg daily but was reduced back to 120 mg due to adverse effects. She felt unwell on the higher dose. She is also on Eliquis  for atrial fibrillation.    She was recently started on spironolactone , which likely induced hyperkalemia with a potassium level of 5.9 on October 12, 2024. The  potassium level was corrected to 4.4 by October 13, 2024.    She has a history of coronary artery disease and was found to have new T wave inversions on ECG with concerns for prolonged QT intervals, initially at 472 milliseconds and further prolonged to 484 milliseconds. No history of syncope or sudden cardiac death in the family, although heart trouble, particularly atrial fibrillation, runs in the family among the women.    Her heart rate drops significantly during sleep, sometimes as low as 30 beats per minute, but it returns to normal per daughter's report.           Allergies:   Allergies[1]    Medications:   Scheduled Meds:Scheduled Medications[2]  Continuous Infusions:Infusions Meds[3]  PRN Meds:.PRN Medications[4]  Outpatient Medications: Current Medications[5]    Medical History:  Past Medical History[6]    Social History:  Tobacco Use History[7]  Social History     Substance and Sexual Activity   Alcohol Use No    Alcohol/week: 0.0 standard drinks of alcohol     Social History     Substance and Sexual Activity   Drug Use No       Family History:  Family History[8]    Code Status:  Full Code    Review of Systems: 12 point review negative except for stated above    Objective:     Physical Exam:  VITAL SIGNS: Temp:  [36.4 ??C (97.5 ??F)-36.7 ??C (98.1 ??F)] 36.7 ??C (98.1 ??F)  Pulse:  [57-72] 61  Resp:  [16-18] 16  BP: (138-153)/(71-81) 138/75  SpO2:  [88 %-99 %] 96 %    Physical Exam   Constitutional: She appears chronically ill.   HENT:   Nose: Nose normal. Mouth/Throat: Oropharynx is clear.   Eyes: Pupils are equal, round, and reactive to light. Conjunctivae are normal.   Cardiovascular: Normal rate and regular rhythm.   Unable to perform auscultation due to contact precautions and lack of disposable equipment    Pulmonary/Chest: Effort normal.   Abdominal: Soft.   Musculoskeletal:      Cervical back: Normal range of motion and neck supple.      Comments: Limited ROM of the fingers and BLE    Neurological: She is alert and oriented to person, place, and time.   Skin: Skin is dry.          Test Results  I personally reviewed the results of the following diagnostic studies  ECG: (Most recent ECG personally reviewed) sinus rhythm, right deviation, occasional PVCs, prolonged QTc >500 ms per Bazett equation, appears to be type I.  Echocardiogram:  03/11/2024  Summary    1. The left ventricular systolic function is hyperdynamic, LVEF is visually  estimated at 70%.    2. The left atrium is dilated in size.    3. The right ventricle is moderately dilated in size, with moderately  reduced systolic function.    4. There is severe pulmonary hypertension. 5. TR maximum velocity: 4.6 m/s  Estimated PASP: 96 mmHg.    6. The right atrium is dilated in size.      CXR:10/14/2024  Impression   Bibasilar opacities likely atelectasis due to very large hiatal hernia as better visualized on the recent CT abdomen pelvis. Superimposed airspace disease is cannot be exclude.         Mild diffuse bronchial wall thickening could be infectious or inflammatory.     Nuclear Stress Test: 06/29/2020  Impressions:  - No evidence for significant ischemia  or scar is noted.  - Post stress: Global systolic function is normal. The ejection fraction was greater than 65%.  - Coronary and thoracic aorta calcifications as well as dilated main pulmonary trunk noted on CT, better characterized on recent chest CT scan dated 06/22/2020  - Large hiatal hernia noted, better characterized on recent CT abdomen 06/22/2020      Cardiac Cath:none on file     Labs:   Lab Results   Component Value Date    CKTOTAL 87 07/07/2013    CKMB 1.2 07/07/2013    TROPONINI 27 10/12/2024    TROPONINI 23 10/12/2024    TROPONINI 14 06/20/2024     Lab Results   Component Value Date    WBC 7.8 10/14/2024    HGB 11.7 10/14/2024    HCT 35.6 10/14/2024    PLT 274 10/14/2024       Lab Results   Component Value Date    NA 137 10/14/2024    K 4.4 10/14/2024    CL 98 10/14/2024    CO2 23.1 10/14/2024    BUN 15 10/14/2024    CREATININE 0.88 10/14/2024    GLU 129 10/14/2024    CALCIUM  9.4 10/14/2024    MG 1.8 10/14/2024    PHOS 3.1 04/13/2024       Lab Results   Component Value Date    BILITOT 0.6 10/12/2024    BILIDIR 0.10 03/22/2024    PROT 8.3 (H) 10/12/2024    ALBUMIN  3.5 10/12/2024    ALT 8 (L) 10/12/2024    AST 22 10/12/2024    ALKPHOS 83 10/12/2024    GGT 16 09/17/2011       Lab Results   Component Value Date    PT 12.1 03/04/2024    INR 1.06 03/04/2024    APTT 40.0 (H) 06/21/2024     Ht Readings from Last 3 Encounters:   10/13/24 157.5 cm (5' 2.01)   10/08/24 157.5 cm (5' 2)   09/25/24 152.4 cm (5')     Lab Results   Component Value Date    INR 1.06 03/04/2024    INR 1.1 08/02/2013    INR 1.0 09/17/2011         I personally spent 50 minutes face-to-face and non-face-to-face in the care of this patient, which includes all pre, intra, and post visit time on the date of service; reviewing EMR, patient physical exam, reviewing results, patient/family education, communication with primary team, coordinating outpatient follow up/services and  all related documentation.  All documented time was specific to the E/M visit and does not include any procedures that may have been performed.      Amoreena Neubert, ACNP-AG  October 15, 2024 11:14 AM  Division of Cardiology   Cardiovascular Medicine         Disclaimer: Nuance Dragon voice recognition software was used to create portions of this document.  An attempt at proofreading has been made to minimize errors.  If, however, the reader notes inconsistent information and/or needs to ask questions concerning any part of the document, please contact me for clarification.  Occasional interpretation errors may inadvertently occur due to limitations in the software.            [1]   Allergies  Allergen Reactions    Lisinopril Anaphylaxis and Swelling    Losartan  Dizziness     Other Reaction(s): Dizziness    Hctz [Hydrochlorothiazide ]      SIADH   [2]    apixaban   2.5 mg Oral BID    dilTIAZem   180 mg Oral Daily    DULoxetine   20 mg Oral Daily    furosemide   20 mg Oral Daily   [3] [4] acetaminophen , albuterol , cyclobenzaprine , gabapentin , polyethylene glycol, senna  [5]   Current Facility-Administered Medications:     acetaminophen  (TYLENOL ) tablet 650 mg, 650 mg, Oral, Q4H PRN, Elizabethann Macleod D, DO, 650 mg at 10/15/24 9082    albuterol  (PROVENTIL  HFA;VENTOLIN  HFA) 90 mcg/actuation inhaler 2 puff, 2 puff, Inhalation, Q4H PRN, Kaiser, Dallas D, DO    apixaban  (ELIQUIS ) tablet 2.5 mg, 2.5 mg, Oral, BID, Cleone Beal, Maya S, MD    cyclobenzaprine  (FLEXERIL ) tablet 5 mg, 5 mg, Oral, Nightly PRN, Kaiser, Dallas D, DO, 5 mg at 10/13/24 0414    dilTIAZem  (CARDIZEM  CD) 24 hr capsule 180 mg, 180 mg, Oral, Daily, Kaiser, Dallas D, DO, 180 mg at 10/15/24 9175    DULoxetine  (CYMBALTA ) DR capsule 20 mg, 20 mg, Oral, Daily, Kaiser, Dallas D, DO, 20 mg at 10/15/24 9175    furosemide  (LASIX ) tablet 20 mg, 20 mg, Oral, Daily, Kaiser, Dallas D, DO, 20 mg at 10/15/24 9175    gabapentin  (NEURONTIN ) capsule 100 mg, 100 mg, Oral, TID PRN, Elizabethann, Dallas D, DO, 100 mg at 10/13/24 1514    polyethylene glycol (MIRALAX ) packet 17 g, 17 g, Oral, Daily PRN, Repko, Alexander J, DO    senna (SENOKOT) tablet 2 tablet, 2 tablet, Oral, Nightly PRN, Repko, Marsa PARAS, DO  [6]   Past Medical History:  Diagnosis Date    Anxiety     Arthritis     At risk for falls     Breast cyst     Cancer    (CMS-HCC)     bladder    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    Chronic kidney disease     Depression, psychotic    (CMS-HCC)     Diabetes mellitus (CMS-HCC)     in past    Emphysema of lung (CMS-HCC)     Financial difficulties     Frail elderly     Hearing impairment     Hernia     History of transfusion     Hyperlipidemia     Hypertension     Impaired mobility     NSTEMI (non-ST elevated myocardial infarction) (CMS-HCC) 12/25/2023    Osteoporosis     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/08/2015    Visual impairment    [7]   Social History  Tobacco Use   Smoking Status Former    Current packs/day: 0.50    Types: Cigarettes   Smokeless Tobacco Never   Tobacco Comments    10cpd, quit about 2 months ago (April 2025)   [8]   Family History  Problem Relation Age of Onset    Breast cancer Daughter 47    Diabetes Mother     Glaucoma Father     Colon cancer Neg Hx     Ovarian cancer Neg Hx     Endometrial cancer Neg Hx     Anesthesia problems Neg Hx     Bleeding Disorder Neg Hx

## 2024-10-19 DIAGNOSIS — G603 Idiopathic progressive neuropathy: Principal | ICD-10-CM

## 2024-10-19 DIAGNOSIS — F259 Schizoaffective disorder, unspecified: Principal | ICD-10-CM

## 2024-10-19 NOTE — Unmapped (Cosign Needed)
 [facility] FAMILY MEDICINE [place] POPULATION HEALTH  Care Management Progress Note    Date: 10/19/2024  Outcome:  Phone outreach completed    Is this patient in [place] ? Yes    Purpose of contact:           Contacted patient's daughter regarding hospital follow-up and DME. Requested items: [name] and Padded 3-in-1 commode. [name] will help with exercises recommended by PT. They had one at the hospital and daughter felt it could helpful to have at home as well.    [employer] is their preferred DME vendor. [name] is planning to ask today if they have what they in stock, will let CM know and if not, CM and pt's daughter will attempt to locate alternative resources that stock what the patient needs - [name] and PADDED commode  [employer] contacted Medicaid/medicare and payment issue was resolved. Patient's daughter received a call the following week that everything had been resolved and the patient can get her PWC. [employer] delivering wheelchair October 30th.  [facility] did deliver the commode back in July, but it was not padded (padded ones were out of stock), therefore, they ended up sending it back.  Per [name], patient was prescribed a new heart medicine - but states she feels iffy about it - concerned that old med isn't getting cut down as instructed and would like guidance  November 7th - cardiology appointment follow-up  Per daughter - was told to cut diltiazem  down to 90 (from 120) while also on new medication, and patient's daughter unsure as the patient's discharge papers differed in this instruction.  [name] request CM reach out to providers regarding this concern. Staff message sent.     Discharge Summary states:     nadolol 40 MG tablet  Commonly known as: CORGARD  Take 1 tablet (40 mg total) by mouth daily.    dilTIAZem  120 MG 24 hr capsule  Commonly known as: CARDIZEM  CD  Take 1 capsule (120 mg total) by mouth daily.  What changed:   medication strength  how much to take    Additional Information/Plan:  Patient provided my direct contact information and encouraged to contact me should additional needs arise.    Time Spent Per Day:  Chart review was completed prior to outreach attempt.   10/19/2024: 20      Alfornia Tammy Braun, MSW  [facility] FAMILY MEDICINE [place]

## 2024-10-20 NOTE — Progress Notes (Signed)
 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    Paroxysmal atrial fibrillation    (CMS-HCC)    Relevant Orders    External ECG-3 days to 7 days (ZIO) (Completed)       Nervous and Auditory    Peripheral neuropathy       Genitourinary    Chronic renal disease, stage 2, mildly decreased glomerular filtration rate (GFR) between 60-89 mL/min/1.73 square meter       Other    Schizoaffective disorder    (CMS-HCC) - Primary    History of bladder cancer     Other Visit Diagnoses         Prolonged Q-T interval on ECG        Relevant Orders    External ECG-3 days to 7 days (ZIO) (Completed)    ECG 12 lead          Recheck ecg sl improved  Ziopatch  Follow with me 4 weeks  Updated problem list annotations  Database review and update  Med review and update  Lab review and update  Counseling provided and patient education    50 minutes with patient- 1/2 in face to face counseling on chronic health conditions and  recent hospitalization and prob above and below    SUBJECTIVE:    Tammy Braun is a 83 y.o. female that presents to clinic today regarding the following issues:    Recently hospitalized with nausea and diarrhea, along with hyperkalemia- had started spironolactone . Spironolactone  was held on admission.   EKG did not demonstrate widened QRS or peaked T waves but did demonstrate QT prolongation. Patient received IV lasix  and K normalized the next morning.   Nnausea improved and tolerated oral food without vomiting. one episode of vomiting first morning of admission but no further.   She continued to have intermittent diarrhea during admission, bowel down titrated prior to discharge.   She was provided recommendations for use of miralax /senna/immodium in the discharge instructions.   Negative C diff and GIPP one month ago from the ED, and had no leukocytosis, fever, or abdominal pain, diarrhea was non-bloody,    Her QT interval remained prolonged on EKG and telemetry monitoring despite normalizaiton of electrolytes.   TSH checked and normal. QT prolonging meds held (anti-emetics) and QT still did not improve after holding for > 24 hours.   She is on prolixin  injections for her schizoaffective disorder but this is not a new medication.   Cardiology was consulted and recommended initiation of nadolol 40 mg and placement of a ziopatch at PCP follow up appointment to monitor PVC burden and QT interval.   Their service will also arrange sooner follow up with her primary cardiologist.      BP had been very high at my last visit two weeks ago     reports that she has quit smoking. Her smoking use included cigarettes. She has never used smokeless tobacco.    The following portions of the patient's history were  reviewed and updated as appropriate: allergies, current medications, past family history, past medical history, past social history, past surgical history and problem list.    Review of systems for 10 organ systems conducted and no significant positives      OBJECTIVE:    Vital signs have been reviewed:   Vitals:    11/19/24 1145   BP: 150/70   Pulse:      BP 150/70  Lungs cta  Cv rsr  Neuro no change

## 2024-10-20 NOTE — Unmapped (Signed)
 Copied from CRM #[id number]. Topic: Other - Other  >> Oct 20, 2024  9:47 AM Tammy Braun wrote:  [facility] called in stating that patient missed an appointment with them on 10/13 due to her being in the ER that day. Updating provider on this.

## 2024-10-22 MED FILL — FLUPHENAZINE DECANOATE 25 MG/ML INJECTION SOLUTION: INTRAMUSCULAR | 30 days supply | Qty: 5 | Fill #9

## 2024-10-23 DIAGNOSIS — F259 Schizoaffective disorder, unspecified: Principal | ICD-10-CM

## 2024-10-23 DIAGNOSIS — G603 Idiopathic progressive neuropathy: Principal | ICD-10-CM

## 2024-10-23 NOTE — Telephone Encounter (Signed)
 vm full-returned call from vm

## 2024-10-23 NOTE — Progress Notes (Cosign Needed)
 [facility] FAMILY MEDICINE [place] POPULATION HEALTH  Care Management Progress Note    Date: 10/23/2024  Outcome:  Phone outreach completed    Is this patient in [place] ? Yes    Purpose of contact:           CM received message from patient who had directly called in requesting to speak with her psychiatry nurse Deb regarding an urgent matter. Unable to locate Deb via patient's care team and visit notes to forward patient's request, so CM followed up directly.     Attempted to contact patient at the number that was requested in the initial call requesting assistance [phone]) but number was disconnected. Contacted patient's daughter and requested to speak to patient directly. Patient states that she is doing OK but her leg is swollen and this is bothering her.    Patient did call in this morning to speak with her nurse coordinator regarding her long-acting injectable. Patient would like to schedule her injectable a little bit sooner than what she is scheduled for (Friday the 31st). Informed patient and daughter that they would need to call their office directly with request. Provided appointment details (visit on the 31st is at [facility] location) and contact information for the clinic. They will go ahead and call to reschedule.     Psychiatry step clinic - [phone] for [place] [facility] and adult psychiatry clinics located at [address].    Additional Information/Plan:  Patient provided my direct contact information and encouraged to contact me should additional needs arise.    [name], MSW  Population Health  [facility] FAMILY MEDICINE [place]

## 2024-10-23 NOTE — Telephone Encounter (Signed)
 Patient daughter Tammy Braun called stating she would like to speak with the doctor about her mom getting her LAI earlier than 10/30/2024.

## 2024-10-23 NOTE — Telephone Encounter (Signed)
 Copied from CRM #[id number]. Topic: Access To Clinicians - Req Clinic Call Back  >> Oct 23, 2024  8:22 AM Rowe B wrote:  Reason of the Call:   Patient called and stated that she really needs to speak with her psychiatry nurse Dorlene) and if she can give her a call as soon as possible.Pt says its very important.    Does the caller want to be contacted regarding this request? Yes. Please contact The patient by Cell Phone Telephone Information:  Mobile          [phone]    Urgent callback turnaround time: within 24 business hours. Programmer, Systems Notified)

## 2024-10-24 MED ORDER — FUROSEMIDE 20 MG TABLET
ORAL_TABLET | Freq: Every day | ORAL | 5 refills | 0.00000 days
Start: 2024-10-24 — End: ?

## 2024-10-26 MED ORDER — FUROSEMIDE 20 MG TABLET
ORAL_TABLET | Freq: Every day | ORAL | 5 refills | 30.00000 days | Status: CP
Start: 2024-10-26 — End: ?

## 2024-10-26 NOTE — Telephone Encounter (Signed)
 Attempted to call back patient's daughter Mort) regarding her call on 10/24. No answer and voicemail was full. Only option was to send an SMS notification with call back number (included clinic's number). Patient has visit scheduled with Dr. Omie on 10/31.    Message from CMA on 10/24 as below.   Patient daughter [name] called stating she would like to speak with the doctor about her mom getting her LAI earlier than 10/30/2024.

## 2024-10-27 NOTE — Progress Notes (Unsigned)
 [facility]  Pulmonary Clinic - Follow up Visit    Referring Physician :  Juliene Braun Braun  PCP:     Braun Juliene Chesley, MD  Reason for Consult:   COPD     ASSESSMENT and PLAN     Tammy Braun is a 83 y.o. female with COPD, restrictive lung physiology, and pHTN    Tammy Braun presented today to for follow up. She has multiple factors contributing to her respiratory symptoms including restrictive lung physiology, pHTN, nocturnal hypoxia (1 L at night), and COPD.     Regarding her restrictive physiology, she has some evidence of possible restriction on her PFTs (think less likely air trapping given normal FEV1/FVC ratio though may be some pseudo-normalization). This is likely in the setting of her large hernia. No evidence of ILD on her imaging.     Regarding her pHTN, has never had a right heart catherization, so not a confirmed diagnosis. However her echos have shown elevated PASP in the 90's. Would be group 2 (diastolic HF), group 3 (hypoxia, COPD- though mild) and possibly group 4 given her clot history. Discussed possible sleep study with patient and family member. However, given patient does not snore, family member declined proceeding which is reasonable (low stop bang score). She remains on 1 L at night. Will repeat overnight oximetry to ensure is appropriate still. She may have nocturnal o2 needs from her large hernia and restrictive physiology. Will pursue V/Q scan at this time, however, to assess for possible CTEPH. If V/Q scan is abnormal, will discuss with patient and family member if they would like to proceed with further work up which would involve RHC and being seen in pHTN to discuss medications.     She has evidence of  mild COPD per her spirometry, though her emphysema in imaging is quite impressive. She is using her rescue inhaler multiple times a day and is not on a maintenance inhaler at this time. Discussed starting LAMA/LABA but they are hesitant due to concern for side effects. They would prefer to switched to nebulized albuterol . I discussed how albuterol  through inhaler or nebulizer were equally efficacious especially with her current lung function and that I thought she would have more benefit from a LAMA/LABA maintenance inhaler. They declined. Will work to get nebulizer.     Also discussed benefit of immunizations, especially given her repeat episodes of pneumonia. However, they decline at this time despite counseling.     Plan:   Inhalers:  Continue PRN albuterol   Will work to get nebulized albuterol    Family declines maintenance inhalers   Overnight oximetry  V/Q scan   Consider pulmonary rehab in future   Continue to discuss immunizations at future visits. They decline at this time despite counseling     Immunizations:   - Flu: declines despite counseling   - Covid: declines despite counseling   - Pneumonia: declines despite counseling   - RSV: declines despite counseling   - Tdap: declines despite counseling     Plan of care was discussed with the patient who acknowledged understanding and is in agreement.    Patient will follow up in 6 months or sooner if needed.    Tammy Braun was seen, examined and discussed with Dr. Johnetta who agrees with the assessment and plan above.     RR:Tammy Braun, Braun Juliene Chesley, MD      Cesar Sharps  Pulmonary and Critical Care Fellow PGY5  HISTORY:      History of Present Illness: Tammy Braun is a 83 y.o. female former smoker (quit 2024, 95 pack year hx)  with PMHx HTN, PVC, CAD, TR, chart hx of pulm HTN, stroke, COPD, DM, schizoaffective d/o who is seen in consultation at the request of Tammy Braun,* for comprehensive evaluation of COPD.     Had been seen in pulmonary HTN clinic in 2021. Was referred at that time given echo c/f pulm HTN. Noted to have COPD, and was taking Spiriva. Was smoking currently at that time. Determined to have group 2 and group 3 in setting of COPD. She states she had cough and some shortness of breath with exertion for many, many years. Denies any hemoptysis. She current takes albuterol  PRN, about every other day, mostly for productive cough. Not on any maintenance inhalers. She denies any wheezing. However, her daughter, who was present in the room said her breathing is very audible when she walks. Her ambulation is limited by her significant OA, so uncertain if SOB would be limiting. Denies orthopnea and PND. Cough not worse at night, worse during the day. Is currently smoking 1 ppd. Reduced from 1.5 ppd. Has been smoking since she was 83 years old.    OSH records and referral documentation reviewed.     Interval Hx 04/2024:  Last seen by me in 09/2023. Since then, admitted to OSH with sepsis from likely pulmonary and urinary sources in 12/2023. Treated with IV Abx. Dx with afib w/ RVR. Declined anticoagulation at that time. Admitted to [facility] for small bowel obstruction. However, developed respiratory failure and required intubation. Underwent X-lap and left abdomen open with re-closure a few days later.  Had to be re-intubated x 2 due to respiratory failure for mucous plugging. Found to have PE as well so started on anticoagulation. Today, she is feeling well from a pulmonary standpoint. Her mobility is still limited, but not having any pulmonary symptoms at her current activity level.  She and her family had many questions about COPD, its natural progression, and treatment options.     Interval Hx 09/2024:   No exacerbations since last visit. Admitted to hospital in 05/2024. Had dyspnea iso of CAP and chronic PE with some component of mild volume overload (only needed few doses of diuretic until euvolemic). Admitted again early October for GI sx.    Family member present who provided additional history.      Today, she states she feels a little weak and whoozy, but attributes this to not eating lunch yet. Initially stated her breathing was doing okay, actually improved since last visit. Later in the visit stated her breathing was bad and requested breathing treatment. Few moments later denied SOB. States using PRN albuterol  2 x a day. They would like a nebulizer machine as they think this will improve her breathing given she did better with it in the hospital. Having some swelling but attributing this to the the diltiazem  which was started during recent admission. Remains on lasix  20 mg. Weights have been stable at home, around 126.  Wearing her nightly oxygen.       STOP-BANG OSA Screening    Snoring: No  Tired: No  Observed apnea: No  HTN: yes  BMI > 35: No  Age > 50: yes  Neck circumference >16 cm: no  Female Gender: no     Score: 2      High risk: 5-8  Intermediate risk: 3-4  Low risk: 0-2  Review of Systems:  A comprehensive review of systems was completed and negative except as noted in HPI.    PMHx:  Past Medical History:   Diagnosis Date    Anxiety     Arthritis     At risk for falls     Breast cyst     Cancer    (CMS-HCC)     bladder    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    Chronic kidney disease     Depression, psychotic    (CMS-HCC)     Diabetes mellitus (CMS-HCC)     in past    Emphysema of lung (CMS-HCC)     Financial difficulties     Frail elderly     Hearing impairment     Hernia     History of transfusion     Hyperlipidemia     Hypertension     Impaired mobility     NSTEMI (non-ST elevated myocardial infarction) (CMS-HCC) 12/25/2023    Osteoporosis     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/08/2015    Visual impairment        PSHx:  Past Surgical History:   Procedure Laterality Date    ABDOMINAL SURGERY      BLADDER SURGERY      BREAST CYST EXCISION      CHEMOTHERAPY  2012    bladder    GALLBLADDER SURGERY      stone removal    ILEOSTOMY  2012    PR COLONOSCOPY FLX DX W/COLLJ SPEC WHEN PFRMD N/A 02/09/2015    Procedure: COLONOSCOPY, FLEXIBLE, PROXIMAL TO SPLENIC FLEXURE; DIAGNOSTIC, W/WO COLLECTION SPECIMEN BY BRUSH OR WASH;  Surgeon: Eleanor CHRISTELLA Sorrel, MD;  Location: [facility] [place];  Service: Gastroenterology    PR EXPLORATORY OF ABDOMEN N/A 02/28/2024    Procedure: EXPLORATORY LAPAROTOMY, EXPLORATORY CELIOTOMY WITH OR WITHOUT BIOPSY(S);  Surgeon: Hollice Toribio Birmingham, MD;  Location: [facility];  Service: General Surgery    PR RECONSTR TOTAL SHOULDER IMPLANT Left 04/08/2018    Procedure: ARTHROPLASTY, GLENOHUMERAL JOINT; TOTAL SHOULDER(GLENOID & PROXIMAL HUMERAL REPLACEMENT(EG, TOTAL SHOULDER);  Surgeon: Alease Carrie Oyster, MD;  Location: [facility] OR [facility];  Service: Ortho Sports Medicine    PR REOPEN RECENT ABD EXPLORATORY Midline 03/01/2024    Procedure: REOPENING OF RECENT LAPAROTOMY;  Surgeon: Vicci Janas CROME., MD;  Location: OR [facility];  Service: Trauma    PR REOPEN RECENT ABD EXPLORATORY N/A 03/03/2024    Procedure: REOPENING OF RECENT LAPAROTOMY;  Surgeon: Quinn Tinnie Verla Dasie, MD;  Location: OR [facility];  Service: Trauma    PR SIGMOIDOSCOPY,BIOPSY N/A 03/11/2015    Procedure: SIGMOIDOSCOPY, FLEXIBLE; WITH BIOPSY, SINGLE OR MULTIPLE;  Surgeon: Sandrea CHRISTELLA Mood, MD;  Location: [facility];  Service: Gastroenterology    PR XCAPSL CTRC RMVL INSJ IO LENS PROSTH W/O ECP Left 06/05/2022    Procedure: EXTRACAPSULAR CATARACT REMOVAL W/INSERTION OF INTRAOCULAR LENS PROSTHESIS, MANUAL OR MECHANICAL TECHNIQUE WITHOUT ENDOSCOPIC CYCLOPHOTOCOAGULATION;  Surgeon: Sinthu Sivarama Ranjan, MD;  Location: [facility] OR [facility];  Service: Ophthalmology    PR XCAPSL CTRC RMVL INSJ IO LENS PROSTH W/O ECP Right 06/19/2022    Procedure: EXTRACAPSULAR CATARACT REMOVAL W/INSERTION OF INTRAOCULAR LENS PROSTHESIS, MANUAL OR MECHANICAL TECHNIQUE WITHOUT ENDOSCOPIC CYCLOPHOTOCOAGULATION;  Surgeon: Sinthu Sivarama Ranjan, MD;  Location: [facility] OR [facility];  Service: Ophthalmology        FHx:  Family History   Problem Relation Age of Onset    Breast cancer Daughter 81  Diabetes Mother     Glaucoma Father     Colon cancer Neg Hx Ovarian cancer Neg Hx     Endometrial cancer Neg Hx     Anesthesia problems Neg Hx     Bleeding Disorder Neg Hx         Social   Lives with daughter    Smoking (cigarettes, vaping, marijuana): Current smoker, 1 ppd, reduced from 1.5 ppd. Started smoking at 17. 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     Exposures:   - asbestos: None   - silica: None  - ceramics: None    - metals: None   - dusts: None   - molds: None   - hot tub/sauna/flooding: None     Pets: A dog. No birds.     Medications:     Current Outpatient Medications:     acetaminophen  (TYLENOL ) 500 MG tablet, Take 1 tablet (500 mg total) by mouth every six (6) hours., Disp: 30 tablet, Rfl: 0    albuterol  HFA 90 mcg/actuation inhaler, Inhale 2 puffs every four (4) hours as needed for wheezing., Disp: 18 g, Rfl: 10    apixaban  (ELIQUIS ) 2.5 mg Tab, Take 1 tablet (2.5 mg total) by mouth two (2) times a day., Disp: 60 tablet, Rfl: 2    blood sugar diagnostic (GLUCOSE BLOOD) Strp, Disp test strips preferred by insurance plan. Testing qday, Dx: E11.9 (Type 2 DM- controlled), Disp: 30 strip, Rfl: 11    blood-glucose meter kit, Disp. blood glucose meter kit preferred by patient's insurance. Check blood sugars as directed by provider. Dx: Diabetes, E11.9, Disp: 1 each, Rfl: 1    calcium  carbonate-vitamin D3 (CALCIUM  600 + D,3,) 600 mg-10 mcg (400 unit) per tablet, Take 1 tablet by mouth two (2) times a day., Disp: 180 tablet, Rfl: 2    diclofenac  sodium (VOLTAREN ) 1 % gel, Apply 2 grams topically four (4) times a day., Disp: 100 g, Rfl: 1    dilTIAZem  (CARDIZEM  CD) 120 MG 24 hr capsule, Take 1 capsule (120 mg total) by mouth daily., Disp: 30 capsule, Rfl: 0    DULoxetine  (CYMBALTA ) 20 MG capsule, Take 1 capsule (20 mg total) by mouth daily., Disp: 30 capsule, Rfl: 2    fluPHENAZine  decanoate (PROLIXIN ) 25 mg/mL injection, Inject 2 mL (50 mg total) into the muscle every thirty (30) days., Disp: 5 mL, Rfl: 11    furosemide  (LASIX ) 20 MG tablet, TAKE 1 TABLET(20 MG) BY MOUTH DAILY, Disp: 30 tablet, Rfl: 5    gabapentin  (NEURONTIN ) 100 MG capsule, Take 1 capsule (100 mg total) by mouth Three (3) times a day as needed (pain)., Disp: 90 capsule, Rfl: 0    inhalational spacing device (E-Z SPACER) Spcr, 1 each by Miscellaneous route every four (4) hours as needed., Disp: 1 each, Rfl: 0    lancets Misc, Disp. lancets #30 or amount allowed, Testing once Qday. Dx: E11.9 (Diabetes- controlled), Disp: 30 each, Rfl: 11    MEDICAL SUPPLY ITEM, Entreal formula nutritionally supplement complete caloric density, Disp: 3 application, Rfl: 3    MEDICAL SUPPLY ITEM, Compression stockings knee high, Disp: 2 each, Rfl: 0    MEDICAL SUPPLY ITEM, Group 2 Power Wheelchair, Research Scientist (life Sciences) , Power Elevating Leg Rests,  Armed Forces Technical Officer., Disp: 1 each, Rfl: 0    miscellaneous medical supply Misc, 1 each by Miscellaneous route four (4) times a day., Disp: 100 each, Rfl: 0    nadolol (CORGARD) 40 MG tablet, Take 1 tablet (40 mg total) by mouth daily., Disp: 30 tablet, Rfl: 0    polyethylene glycol (GLYCOLAX ) 17 gram/dose powder, Mix 1 capful (17 grams) in 4 to 8 ounces of liquid and drink by mouth daily. Use instructions from provider for further titration depending on bowel patterns., Disp: 510 g, Rfl: 2    SENNA 8.6 mg tablet, Take 2 tablets by mouth nightly for 3 nights if no bowel movement in 3 days. Follow further titration instructions from MD., Disp: 60 tablet, Rfl: 2    underpads 2.6 X 2.9 feet Pads, Use 1 each as directed two (2) times a day., Disp: 100 each, Rfl: 3  No current facility-administered medications for this visit.     Allergies: Reviewed.    Other History:  The past medical history, surgical history, social history, family history, medications and allergies were personally reviewed and updated in the patient's electronic medical record.           PHYSICAL EXAM:      Physical Exam:  Vitals:    10/28/24 1242   BP: 141/90   BP Position: Sitting   Pulse: 75   Temp: 36.4 ??C (97.5 ??F)   TempSrc: Temporal   SpO2: 97%   Weight: 57.2 kg (126 lb)         Body mass index is 23.04 kg/m??.    General: well appearing, appears stated age, sitting in wheel chair, Non-toxic. NAD.    Eyes: EOMI grossly. Anicteric sclera.  Respiratory: CTAB, no wheezes, rales, or rhonchi   Cardiovascular: RRR, +s1/s2, no m/r/g. 1+ LEE to shins    Musculoskeletal/extremities: moving all extremities   Skin: No skin rashes on clothed exam  Neuro: Alert and oriented x 3. CN grossly intact. No focal neurological deficits grossly        LABORATORY and RADIOLOGY DATA:     Pulmonary Function Tests/Interpretation:     Pulmonary Function Test Results    : None      Pertinent Laboratory Data:  Reviewed        Pertinent Imaging Data:  Images were personally reviewed with attending.     CT Chest (08/2024):  Bibasilar atelectasis.      Large diaphragmatic defect with intrathoracic stomach, multiple loops of bowel, and proximal pancreas.      Severe intralobular emphysema.      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 Additionally, there our new locules of air within the bilateral renal pelvis sees. This is nonspecific and can be seen with recent manipulation and/or ascending infection.         Unchanged mildly dilated loops of bowel filled with fluid which can be seen with ileus/diarrheal state. These are not significantly changed from prior.     CTA Chest (05/2024):  1. Subacute or chronic occlusive segmental right lower lobe pulmonary embolus. Poor opacification of distal pulmonary artery branches in the left lower lobe likely reflects poor right heart output, though additional small distal emboli are not excluded.   2. Dilated right atrium. Enlarged central pulmonary arteries, reflecting known pulmonary hypertension.   3. Upper lung-predominant emphysema. Multifocal pulmonary infectious opacities, more extensive in the left lung.   4. Large posterior diaphragmatic hernia.       Modified Barium Swallow (  03/26/24):   Study is limited due to technical difficulties majority of fluoroscopic cine clips associated with the exam were not recorded.      Within these limitations, laryngeal penetration occurred with thin liquids. No evidence of aspiration with thin liquids.      Please see full speech and language pathology note for further evaluation.     CT Chest (03/20/24):   --Near complete resolution of previously noted groundglass opacities and consolidation, with mild persistent consolidation within the lingula and right lower lobe.      --Unchanged moderate bilateral pleural effusions with associate near complete collapse of bilateral lower lobes.      --Unchanged large hiatal hernia.              TTE (03/11/2024):  1. The left ventricular systolic function is hyperdynamic, LVEF is visually  estimated at 70%.    2. The left atrium is dilated in size.    3. The right ventricle is moderately dilated in size, with moderately  reduced systolic function.    4. There is severe pulmonary hypertension.    5. TR maximum velocity: 4.6 m/s  Estimated PASP: 96 mmHg.    6. The right atrium is dilated in size.    CT Chest (02/2024):  Findings are suggestive of multifocal aspiration pneumonia.      Unchanged small bilateral pleural effusions and complete collapse of the bilateral lower lobes.     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      New complete collapse of the right middle and lower lobes and left lower lobe with extensive endobronchial secretions concerning for superimposed aspiration pneumonia.      Diffuse bronchial wall thickening suggestive of infectious or inflammatory bronchitis.      Small right-sided and trace left-sided pleural effusions.     CTA Chest (03/03/24):  1.  Filling defects visualized in the distal left anterior segmental artery are favored to represent acute pulmonary embolus.   2.  Enlarged right ventricle and dilated main pulmonary artery, favored chronic in the setting of pulmonary arterial hypertension. There may be mild right heart strain. Recommend echocardiographic for better characterization.   3.  Redemonstrated large Bochdalek hernia extending into the right greater than left hemithorax with complete collapse of bilateral lower lobes.   4.  Small right and trace left pleural effusions. Small volume subcutaneous emphysema overlying the lower anterior chest wall.      The findings of this study were discussed via telephone with Tammy Braun by Dr. Corrin Harari on 03/03/2024 11:59 PM     TTE (03/03/24):  Summary    1. The left ventricular systolic function is normal, LVEF is visually  estimated at 65-70%.    2. Mitral annular calcification is present (moderate).    3. The right ventricle is moderately dilated in size, with moderately  reduced systolic function.    4. Possible McConnell sign is noted- see details below.    5. There is massive tricuspid regurgitation.    6. There is mild-moderate pulmonary hypertension.    7. TR maximum velocity: 3.2 m/s  Estimated PASP: 49 mmHg.    8. The right atrium is moderately dilated in size.    TTE (02/2024):  1. The left ventricle is normal in size with normal wall thickness.    2. The left ventricular systolic function is normal, LVEF is visually  estimated at > 55%.    3. The right ventricle is mildly  to moderately dilated in size, with mildly  reduced systolic function.    4. There is severe tricuspid regurgitation.    5. There is at least moderate pulmonary hypertension, peak velocity may  underestimate RVSP due to severe TR.    6. The right atrium is moderately to severely dilated in size.    CT Chest w/o Contrat (05/2023):   1. As evident on prior imaging, there is extensive volume in the lung bases associated with mass effect from herniation of a large amount of abdominal contents into the caudal aspect of the mediastinum. However, this study demonstrates new complete lack of aeration of the lower lobe of the left lung which likely combination of atelectasis and consolidation. Associated extensive debris within the segmental airways may relate to infection, retained mucus, or aspiration. I favor the latter. No potentially associated obstructing mass lesion is identified. However, consider CT imaging surveillance in 2 to 3 months to ensure improvement.      2. Notable chronic findings include:   - Calcified atherosclerotic disease within the coronary arterial distribution (heavy)   - Advanced destructive centrilobular and substantial paraseptal emphysema, similar to prior.   -Prominent pulmonary pulmonary trunk (3. 7 cm), which may reflect some element of pulmonary arterial hypertension          Echo (12/2022):    1. The left ventricle is normal in size with mildly increased wall  thickness.    2. The left ventricular systolic function is normal, LVEF is visually  estimated at > 55%.    3. The mitral valve leaflets are mildly thickened with normal leaflet  mobility.    4. Mitral annular calcification is present (moderate).    5. There is mild mitral valve regurgitation.    6. The aortic valve is trileaflet with mildly thickened leaflets with normal  excursion.    7. There is mild aortic regurgitation.    8. The right ventricle is normal in size, with normal systolic function.    9. There is moderate to severe tricuspid regurgitation.    10. There is moderate pulmonary hypertension.    11. There is mild pulmonic regurgitation normal  excursion.    7. There is mild aortic regurgitation.    8. The right ventricle is normal in size, with normal systolic function.    9. There is moderate to severe tricuspid regurgitation.    10. There is moderate pulmonary hypertension.    11. There is mild pulmonic regurgitation

## 2024-10-28 ENCOUNTER — Ambulatory Visit
Admit: 2024-10-28 | Discharge: 2024-10-28 | Payer: MEDICARE | Attending: Student in an Organized Health Care Education/Training Program | Primary: Student in an Organized Health Care Education/Training Program

## 2024-10-28 DIAGNOSIS — I2782 Chronic pulmonary embolism: Principal | ICD-10-CM

## 2024-10-28 DIAGNOSIS — G4734 Idiopathic sleep related nonobstructive alveolar hypoventilation: Principal | ICD-10-CM

## 2024-10-28 DIAGNOSIS — J189 Pneumonia, unspecified organism: Principal | ICD-10-CM

## 2024-10-28 DIAGNOSIS — J449 Chronic obstructive pulmonary disease, unspecified: Principal | ICD-10-CM

## 2024-10-28 DIAGNOSIS — F259 Schizoaffective disorder, unspecified: Principal | ICD-10-CM

## 2024-10-28 DIAGNOSIS — R059 Cough: Principal | ICD-10-CM

## 2024-10-28 MED ORDER — ALBUTEROL SULFATE 2.5 MG/3 ML (0.083 %) SOLUTION FOR NEBULIZATION
RESPIRATORY_TRACT | 11 refills | 5.00000 days | Status: CP | PRN
Start: 2024-10-28 — End: 2025-10-28

## 2024-10-28 MED ORDER — ALBUTEROL SULFATE HFA 90 MCG/ACTUATION AEROSOL INHALER
RESPIRATORY_TRACT | 10 refills | 0.00000 days | Status: CP | PRN
Start: 2024-10-28 — End: ?

## 2024-10-28 MED ADMIN — fluPHENAZine decanoate (PROLIXIN) injection 50 mg: 50 mg | INTRAMUSCULAR | @ 16:00:00 | Stop: 2024-10-28

## 2024-10-28 NOTE — Patient Instructions (Addendum)
 It was a pleasure see you in clinic today! You were seen in clinic today by Dr.  Claudene & Dr.  Johnetta       Here's a summary of what we discussed during your visit:  I will work on getting you a nebulizer and as need albuterol  nebs   V/Q scan ordered. They should call you, but their number is: [phone]  Ordered the night time oxygen study       Follow up with me in 6 months!    Tammy Hoffmaster, MD   Pulmonary & Critical Care Fellow  [facility]  Phone: [phone]  Fax: [phone]    To contact your care team, you can either send a message via MyChart or contact the clinic at [phone].    How do I request medication refills?  Request a refill via [facility] (patient portal), call clinic at [phone] or have your pharmacy fax the request to [phone].    [facility]: [phone] *Pharmacy can mail medications to your home. You must call to request the medication be mailed.[facility]: [phone]  [facility]: [phone]    Here are some things you should know about contacting the [facility]:  Please be advised Epic now releases test results to MyChart as soon as they are available which means you will see your test results before I do.    The best way to reach your doctor for non-urgent matters is through MyChart. I can usually respond within two to three business day but I do not check messages after hours (evenings, weekends, and holidays) and often have other duties (inpatient hospital work, producer, television/film/video activities, teaching) that make me unavailable.    - If you have sent a MyChart message to the clinic and have not received a response after three business days, please call our clinic at [phone] to speak to a nurse.     If you have an urgent issue that you feel needs a response the same day, you should also contact your primary care provider or be evaluated at an Urgent Care clinic.    For urgent lung issues after hours, you can call the hospital operator [phone]) and ask for the Pulmonary Fellow On Call. This doctor can provide some guidance and will send a message to your regular lung doctor the next morning.    If there is an emergency, call 911 or go to your closest emergency room.    I don't have a MyChart. Why should I get one?   - It's encrypted, so your information is secure  - It's a quick, easy way to contact the care team, manage appointments, see test results, and more!    How do I sign-up for MyChart?   - Download the MyChart app from the [employer] or [employer] and sign-up in the app  - Sign-up online at [email or web address]

## 2024-10-28 NOTE — Progress Notes (Incomplete)
 [facility]  Psychiatry   Established Patient E&M Service - Outpatient       Assessment:    Tammy Braun presents for follow-up evaluation.     Today 10/29/2024         Identifying Information:  Tammy Braun is a 83 y.o. female with a history of schizoaffective disorder. First presented to [facility] clinic September 2021. She has been stable on prolixin  50 mg q30 days for the past 30 years. Per PCP, patient has had worsening of depressive symptoms, though patient did not endorse. Reports auditory hallucinations and denies any CAH. Reports some anxiety related to impatience waiting on others, but finds this manageable. Patient is not interested in any new medications, but could consider increasing the frequency of prolixin  administration if needed. March 2022, augmenting with mirtazapine  for low mood and sleep. Could also consider augmenting with SRNI or wellbutrin for depression. In September 2025, her orthopedic provider started her on cymbalta  20mg  daily for shoulder pain which she described as also helping with low mood.    Risk Assessment:   An assessment of suicide and violence risk factors was performed as part of this evaluation and is not significantly increased from the last visit.   While future psychiatric events cannot be accurately predicted, the patient does not currently require acute inpatient psychiatric care and does not currently meet   involuntary commitment criteria.      Plan:    Problem: Schizoaffective disorder  Status of problem: chronic and stable  Interventions:   - Continue Prolixin  50 mg IM q30 days  - Continue duloxetine  20mg  daily (prescribed by orthopedics, started Sep 2025)    Problem: TUD  Status of problem:  new problem to this provider  Interventions:  - Previously documented that pt quit without NRT in April 2025.   - Currently smoking 1 cigarette per day.     Problem: High risk medication monitoring   Status of problem:  up to date  Interventions:  - appropriate for yearly monitoring     Lab Results   Component Value Date    A1C 5.6 04/23/2024      Lab Results   Component Value Date    LDL 75 04/23/2024    LDL 88 11/02/2022    LDL 107 (H) 11/03/2021       BP Readings from Last 3 Encounters:   10/28/24 141/90   10/28/24 141/90   10/20/24 150/30       Psychotherapy provided:  No billable psychotherapy service provided.    Patient has been given this writer's contact information as well as the [facility] Psychiatry urgent line number. The patient has been instructed to call 911 for emergencies.    Patient was seen and plan of care was discussed with the Attending MD,Dr. Glenora, who agrees with the above statement and plan.    Subjective:    [ ]  interval history  [ ]  blood pressure issues  [ ]  how is the cymbalta  for pain? For mood?  [ ]  we did the prolixin  a little earlier - how are auditory hallucinations  [ ]  thoughts of harming self  [ ]  si       Objective:    Mental Status Exam:   Appearance:    Appears stated age   Motor:   No abnormal movements   Speech/Language:    Normal rate, volume, tone, fluency and Impaired articulation   Mood:   In and out,  rough   Affect:  Euthymic   Thought process and Associations:   Logical, linear, clear, coherent, goal directed   Abnormal/psychotic thought content:     Denies SI, HI, self harm, delusions, obsessions, paranoid ideation, or ideas of reference   Perceptual disturbances:     Endorses infrequent auditory hallucinations. Most often at the end of the month when due for her LAI.     Visit was completed face to face.    I personally spent 45 minutes face-to-face and non-face-to-face in the care of this patient, which includes all pre, intra, and post visit time on the date of service.  All documented time was specific to the E/M visit and does not include any procedures that may have been performed.        Tammy Braun Tammy Nation, DO her 2 husbands (who have passed away). AH are not worse with her feeling down lately. Makes her feel something might happen to her. Clarifies that voices do not tell her to harm herself and she does not have thoughts of harming herself or not wanting to be alive. Has been taking cymbalta  20mg  daily. Lives with daughter.       Objective:    Mental Status Exam:   Appearance:    Appears stated age   Motor:   No abnormal movements   Speech/Language:    Normal rate, volume, tone, fluency and Impaired articulation   Mood:   In and out,  rough   Affect:   Euthymic   Thought process and Associations:   Logical, linear, clear, coherent, goal directed   Abnormal/psychotic thought content:     Denies SI, HI, self harm, delusions, obsessions, paranoid ideation, or ideas of reference   Perceptual disturbances:     Endorses infrequent auditory hallucinations. Most often at the end of the month when due for her LAI.     Visit was completed face to face.    I personally spent 45 minutes face-to-face and non-face-to-face in the care of this patient, which includes all pre, intra, and post visit time on the date of service.  All documented time was specific to the E/M visit and does not include any procedures that may have been performed.        Tammy Braun M Tammy Palmer, DO

## 2024-10-28 NOTE — Progress Notes (Signed)
 Patient came in today an LAI only visit. Patients medication was patient supplied.  Prolixin  dec 50 mg administered via intramuscular injection.    Location --  Left deltoid    NDC --  42023-129-01    EXP--  11/29/2025    LOT--  [id number]    SN--  [id number]    Patient tolerated injection well. Next injection scheduled for 4 weeks.    Next Scheduled LAI:  11/25/24 ok for 28 days Per [name], RN

## 2024-10-29 NOTE — Progress Notes (Signed)
 [facility] FAMILY MEDICINE [place] POPULATION HEALTH  Care Management Progress Note    Date: 10/29/2024  Outcome:  Phone outreach completed    Is this patient in [place] ? Yes    Purpose of contact:           DME follow up to see if family had spoken to [employer]. Per daughter [name], they have a Camie Lift instead of a Camie Ip available, and are not able to supply it at this time as they do not have a relationship with a manufacturer.    CM to attempt to help family locate other options.    Contacted the following supplier(s) who specialize in :  [employer] - [phone] - left message.  [employer] - [phone] - spoke with Cy. They do not carry this and do not have any recommendations for suppliers.     Contacted manufacturer:  [employer]/product designer) +[phone] - spoke with Kristi. Per [place], they do not have a vendor or supplier list available but recommended CM reach their HomeCare team for assistance.   HomeCare Team: Octaviano Slain, [phone], email: [email or web address]  CM sent inquiry via email      Additional Information/Plan:  Patient provided my direct contact information and encouraged to contact me should additional needs arise.    Time Spent Per Day:  Chart review was completed prior to outreach attempt.   10/29/2024: 15      [name], MSW  [employer] [facility]ILY [facility] [place]

## 2024-10-30 NOTE — Telephone Encounter (Addendum)
 Hi Dr Elige     There is a form in your box requiring your signature     Thank you,  Massie        Forms faxed to [employer] and [employer] care

## 2024-10-30 NOTE — Telephone Encounter (Signed)
 Attempted to call patient/daughter at [phone] after missed appointment with our [facility] clinic. VM box full and unable to leave message.  Will send MyChart message.

## 2024-11-03 NOTE — Telephone Encounter (Signed)
 Hi Dr. Elige,    There is a form in your box requiring your signature.    Thank you,  Massie

## 2024-11-04 NOTE — Progress Notes (Signed)
 [facility] FAMILY MEDICINE [place] POPULATION HEALTH  Care Management Progress Note    Date: 11/04/2024  Outcome:  Phone outreach completed    Is this patient in [place] ? Yes    Purpose of contact:           Reply received from [facility] [employer] representative - they have two authorized retailers in the area:    [employer] and [employer]. [phone]     [employer]   [employer]. [phone]    Update 11/11/24:  Contacted [employer] to learn more about the process. Per representative, they don't have any currently in stock and states it would require a PT evaluation. States they would likely not   [employer] and [employer]. Spoke with research scientist (medical) at [employer] branch, she is not aware of this piece of equipment but will refer to [facility] and [place] branches. VM left.    Next steps: update family on progress.     Additional Information/Plan:  Patient provided my direct contact information and encouraged to contact me should additional needs arise.    [name], MSW  Population Health  [facility] FAMILY MEDICINE [place]

## 2024-11-06 ENCOUNTER — Inpatient Hospital Stay: Admit: 2024-11-06 | Discharge: 2024-11-08 | Payer: MEDICARE

## 2024-11-06 DIAGNOSIS — F259 Schizoaffective disorder, unspecified: Principal | ICD-10-CM

## 2024-11-06 MED ORDER — FLUPHENAZINE DECANOATE 25 MG/ML INJECTION SOLUTION
INTRAMUSCULAR | 11 refills | 60.00000 days | Status: CP
Start: 2024-11-06 — End: ?
  Filled 2024-11-19: qty 5, 30d supply, fill #0

## 2024-11-06 MED ADMIN — Xenon Xe-133 Gas: 8.4 | RESPIRATORY_TRACT | @ 17:00:00 | Stop: 2024-11-06

## 2024-11-06 MED ADMIN — Tc-99m Macroagragated Albumin (MAA): 4.4 | INTRAVENOUS | @ 17:00:00 | Stop: 2024-11-06

## 2024-11-06 NOTE — Telephone Encounter (Signed)
 Re-ordered Prolixin  dec 50mg  q30 days with 11 refills + CAM order.

## 2024-11-09 NOTE — Telephone Encounter (Signed)
 Copied from CRM #[id number]. Topic: Access To Clinicians - Req Clinic Call Back  >> Nov 09, 2024 10:35 AM Rosaline BROCKS wrote:  Clinical Advice: Harlene from [employer], protective service divison, is requesting a call back to find out his take on the patients Adult well being and any concerns he knows of her being in the adult home.      They have been seen for this issue previously.  Harlene Bowman preferred contact:   [phone] Routine callback turnaround time: 24-48 business hours. Programmer, Systems Notified)

## 2024-11-10 DIAGNOSIS — E1149 Type 2 diabetes mellitus with other diabetic neurological complication: Principal | ICD-10-CM

## 2024-11-10 DIAGNOSIS — I2510 Atherosclerotic heart disease of native coronary artery without angina pectoris: Principal | ICD-10-CM

## 2024-11-10 DIAGNOSIS — I5033 Acute on chronic diastolic (congestive) heart failure: Principal | ICD-10-CM

## 2024-11-10 DIAGNOSIS — I6529 Occlusion and stenosis of unspecified carotid artery: Principal | ICD-10-CM

## 2024-11-10 DIAGNOSIS — K56609 Unspecified intestinal obstruction, unspecified as to partial versus complete obstruction: Principal | ICD-10-CM

## 2024-11-10 DIAGNOSIS — I272 Pulmonary hypertension, unspecified: Principal | ICD-10-CM

## 2024-11-10 DIAGNOSIS — I639 Cerebral infarction, unspecified: Principal | ICD-10-CM

## 2024-11-10 DIAGNOSIS — I10 Essential (primary) hypertension: Principal | ICD-10-CM

## 2024-11-10 DIAGNOSIS — J432 Centrilobular emphysema: Principal | ICD-10-CM

## 2024-11-10 NOTE — Progress Notes (Signed)
 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 female that presents to clinic today regarding the following issues:    Seen by me 3 weeks ago  See note  Recently hospitalized with nausea and diarrhea, along with hyperkalemia- had started spironolactone . Spironolactone  was held on admission.   EKG did not demonstrate widened QRS or peaked T waves but did demonstrate QT prolongation. Patient received IV lasix  and K normalized the next morning. nausea improved and tolerated oral food without vomiting. one episode of vomiting first morning of admission but no further.  She continued to have intermittent diarrhea during admission, bowel down titrated prior to discharge. She was provided recommendations for use of miralax /senna/immodium in the discharge instructions. Negative C diff and GIPP one month ago from the ED, and had no leukocytosis, fever, or abdominal pain, diarrhea was non-bloody,  Her QT interval remained prolonged on EKG and telemetry monitoring despite normalizaiton of electrolytes. TSH checked and normal. QT prolonging meds held (anti-emetics) and QT still did not improve after holding for > 24 hours.   She is on prolixin  injections for her schizoaffective disorder but this is not a new medication. Cardiology was consulted and recommended initiation of nadolol 40 mg and placement of a ziopatch at PCP follow up appointment to monitor PVC burden and QT interval. Their service will also arrange sooner follow up with her primary cardiology    Also seen pulmonary   multiple factors contributing to her respiratory symptoms including restrictive lung physiology, pHTN, nocturnal hypoxia (1 L at night), and COPD. evidence of  mild COPD per her spirometry, though her emphysema in imaging is quite impressive. Inhalers:  Continue PRN albuterol   Will work to get nebulized albuterol    Family declines maintenance inhalers   Overnight oximetry  V/Q scan   Consider pulmonary rehab in future        reports that she has quit smoking. Her smoking use included cigarettes. She has never used smokeless tobacco.    The following portions of the patient's history were reviewed and updated as appropriate: allergies, current medications, past family history, past medical history, past social history, past surgical history and problem list.    Review of systems for 10 organ systems conducted and no significant positives  OBJECTIVE:    Vital signs have been reviewed:   Vitals:    11/10/24 1217   BP: 140/90   Pulse:    Temp:      Lungs few crackles  Cv rsr  Ext 1+ edema

## 2024-11-10 NOTE — Telephone Encounter (Signed)
 Hi Dr. Elige,    There is a form in your box requiring your signature.    Thank you,  Massie

## 2024-11-11 ENCOUNTER — Ambulatory Visit: Admit: 2024-11-11 | Payer: MEDICARE

## 2024-11-11 NOTE — Telephone Encounter (Signed)
 [employer] order [id number] form faxed.  Fax number: [phone]

## 2024-11-12 NOTE — Progress Notes (Signed)
 [facility] Clinic Administered Medication Refill Coordination Note      NAME:Tammy Braun DOB: 14-Jun-1941      Medication: fluPHENAZine  decanoate 25 mg/mL injection (PROLIXIN )    Day Supply: 30 days      SHIPPING      Next delivery from [facility] [phone]) to [facility] clinic for [name] is scheduled for 11/20/2024.    Clinic contact: [name]    Patient's next nurse visit for administration: 11/25/2024.    We will follow up with clinic monthly for standard refill processing and delivery.      [name]  [facility] Specialty Technician

## 2024-11-14 NOTE — Progress Notes (Signed)
 I saw and evaluated the patient, participating in the key portions of the service.  I reviewed the resident???s note.  I agree with the resident???s findings and plan.        Montine Circle, MD

## 2024-11-16 NOTE — Telephone Encounter (Signed)
 Hi Dr. Elige,    There is a form in your box requiring your signature.    Thank you,  Massie

## 2024-11-16 NOTE — Telephone Encounter (Signed)
 Received notification from clinic staff that patient requested earlier appointment for Prolixin  LAI injection.   Attempted to call patient to discuss reason for earlier appointment as she has expressed a wearing-off effect and exacerbation of symptoms in past visits.   Reached voicemail but unfortunately box is full. Clinic staff will notify me if patient returns call.

## 2024-11-17 DIAGNOSIS — I639 Cerebral infarction, unspecified: Principal | ICD-10-CM

## 2024-11-17 DIAGNOSIS — E1149 Type 2 diabetes mellitus with other diabetic neurological complication: Principal | ICD-10-CM

## 2024-11-17 NOTE — Progress Notes (Signed)
 [facility]  Psychiatry   Established Patient E&M Service - Outpatient       Assessment:    Tammy Braun presents for follow-up evaluation.     Today 11/17/2024, patient reports stable mood and neurovegetative symptoms, but reports a wearing-off effect of her prolixin  LAI at the three week mark with AH recurrence around this time. Discussed options for treatment including adding low-dose prolixin /seroquel, increasing dose of prolixin  LAI, or changing LAI administration to every 3 weeks. Patient's daughter expressed concern about movement and cognitive side effects with additional antipsychotic and they prefer to try nonpharmacologic strategies. Discussed therapy referral to help cope with distressing voices and past negative experiences. Return to clinic in 2 months.     Plan:   [ ]  maintain current medication regimen  [ ]  therapy referral     Identifying Information:  Tammy Braun is a 83 y.o. female with a history of schizoaffective disorder. First presented to [facility] clinic September 2021. She has been stable on prolixin  50 mg q30 days for the past 30 years. Per PCP, patient has had worsening of depressive symptoms, though patient did not endorse. Reports auditory hallucinations and denies any CAH. Reports some anxiety related to impatience waiting on others, but finds this manageable. Patient is not interested in any new medications, but could consider increasing the frequency of prolixin  administration if needed. March 2022, augmenting with mirtazapine  for low mood and sleep. Could also consider augmenting with SRNI or wellbutrin for depression. In September 2025, her orthopedic provider started her on cymbalta  20mg  daily for shoulder pain which she described as also helping with low mood.    Risk Assessment:   An assessment of suicide and violence risk factors was performed as part of this evaluation and is not significantly increased from the last visit.   While future psychiatric events cannot be accurately predicted, the patient does not currently require acute inpatient psychiatric care and does not currently meet [place]  involuntary commitment criteria.      Plan:    Problem: Schizoaffective disorder  Status of problem: chronic and stable  Interventions:   - Continue Prolixin  50 mg IM q28 days  - Continue duloxetine  20mg  daily (prescribed by orthopedics, started Sep 2025)  - speak to CM about therapy referrals    Problem: TUD  Status of problem:  chronic and stable  Interventions:  - Previously documented that pt quit without NRT in April 2025.   - Currently smoking 1 cigarette per day.     Problem: High risk medication monitoring   Status of problem:  up to date  Interventions:  - appropriate for yearly monitoring     Lab Results   Component Value Date    A1C 5.6 04/23/2024      Lab Results   Component Value Date    LDL 75 04/23/2024    LDL 88 11/02/2022    LDL 107 (H) 11/03/2021       BP Readings from Last 3 Encounters:   11/10/24 140/90   10/28/24 141/90   10/28/24 141/90       Psychotherapy provided:  No billable psychotherapy service provided.    Patient has been given this writer's contact information as well as the [facility] Psychiatry urgent line number. The patient has been instructed to call 911 for emergencies.    Patient was seen and plan of care was discussed with the Attending MD,Dr. DeVito, who agrees with the above statement and plan.    Subjective:  Patient seen individually and with daughter present.     Per patient: States she's doing well overall. Notices a wearing-off effect of prolixin  LAI after about 3 weeks. Hears distressing voices at night while trying to fall asleep. Otherwise mood has been stable. Feels energy is adequate. Wakes up sometimes in the middle of the night. No problems with appetite. Denies side effects from current psychotropics.   Does not want to increase dose of medication. Isn't interested in adding more medications at this time.   Denies SI/passive death wish. Health is stable.    Per daughter: Does not want to change current regimen. Worried about EPS and cognitive symptoms with additional psychtropics. Feels her mother could benefit from someone to talk to about distressing voices.       Objective:    Mental Status Exam:   Appearance:    Appears stated age   Motor:   No abnormal movements   Speech/Language:    Normal rate, volume, tone, fluency and Impaired articulation   Mood:   Fine   Affect:   Calm and Euthymic   Thought process and Associations:   Logical, linear, clear, coherent, goal directed   Abnormal/psychotic thought content:     Denies SI, HI, self harm, delusions, obsessions, paranoid ideation, or ideas of reference   Perceptual disturbances:     Endorses auditory hallucinations at night that return around the three week mark after her injections     Visit was completed face to face.    I personally spent 45 minutes face-to-face and non-face-to-face in the care of this patient, which includes all pre, intra, and post visit time on the date of service.  All documented time was specific to the E/M visit and does not include any procedures that may have been performed.        Gearld Kerstein M Thaer Miyoshi, DO

## 2024-11-17 NOTE — Progress Notes (Cosign Needed Addendum)
 [facility] FAMILY MEDICINE [place] POPULATION HEALTH  Care Management Progress Note    Date: 11/17/2024  Outcome:  Phone outreach completed    Is this patient in [place] ? Yes    Purpose of contact:           Contacted [employer] regarding Camie Ip as VM to local branch has not yet been returned.     Per customer service rep, she is unable to confirm/deny that they offer this specific product, although she can confirm that standing devices are part of their stock. She advised that an order be sent over and then insurance and stock issues can be handled at the time of processing    Confirmed fax for branch: Fax - [phone]. Order sent with demographics, PCP visit notes, and inpatient discharge summary sent via Epic eFax.     Given recent MyChart log-ins, sent update on order to patient and patient's daughter [name] via MyChart.    Additional Information/Plan:  Patient provided my direct contact information and encouraged to contact me should additional needs arise.    Time Spent Per Day:  Chart review was completed prior to outreach attempt.   11/17/2024: 15    [name], MSW  [employer] [facility] [place]

## 2024-11-17 NOTE — Patient Instructions (Signed)
 It was a pleasure seeing you in clinic today!   As we discussed, as for medications we will not make changes. I will speak to our case manager about therapy referrals. We will see you back in 2 months for follow up. If you need to reach me for any non-urgent needs or refills, please utilize MyChart.    Follow-up instructions:  - Please continue taking your medications as prescribed for your mental health.   - Do not make changes to your medications, including taking more or less than prescribed, unless under the supervision of your physician. Be aware that some medications may make you feel worse if abruptly stopped  - Please refrain from using illicit substances, as these can affect your mood and could cause anxiety or other concerning symptoms.   - Seek further medical care for any increase in symptoms or new symptoms such as thoughts of wanting to hurt yourself or hurt others.     Contact info:  Life-threatening emergencies: Call 911, the 988 suicide and crisis lifeline, or go to the nearest ER for medical or psychiatric attention.           Issues that need urgent attention but are not life threatening: Call the clinic outpatient front desk for assistance:    - [phone] for [facility] adult [facility] clinics located at [address] [facility]  - [phone] for [facility] child and adolescent psychiatry clinics located at [address]  - [phone] for [facility] and adult psychiatry clinics located at [address] [facility]    Non-urgent routine concerns and questions: Send a message through [facility] or call our clinic front desk.    Refill requests: Check with your pharmacy to initiate refill requests.    Regarding appointments:  - If you need to cancel your appointment, we ask that you call your clinic at least 24 hours before your scheduled appointment at the numbers listed above.  - If for any reason you arrive 15 minutes later than your scheduled appointment time, you may not be seen and your visit may be rescheduled.  - Please remember that we will not automatically reschedule missed appointments.  - If you miss two (2) appointments without letting us  know in advance, you will likely be referred to a provider in your community.  - We will do our best to be on time. Sometimes an emergency will arise that might cause your clinician to be late. We will try to inform you of this when you check in for your appointment. If you wait more than 15 minutes past your appointment time without such notice, please speak with the front desk staff.    In the event of bad weather, the clinic staff will attempt to contact you, should your appointment need to be rescheduled. Additionally, you can call the Patient Weather Line [phone] for system-wide clinic status    For more information and reminders regarding clinic policies (these were provided when you were admitted to the clinic), please ask the front desk.

## 2024-11-19 NOTE — Telephone Encounter (Signed)
 Hi Dr, Elige,    There is a form in your box requiring your signature     Thank you,  Massie

## 2024-11-20 DIAGNOSIS — F259 Schizoaffective disorder, unspecified: Principal | ICD-10-CM

## 2024-11-20 DIAGNOSIS — Z79899 Other long term (current) drug therapy: Principal | ICD-10-CM

## 2024-11-20 MED ORDER — FLUPHENAZINE 1 MG TABLET
ORAL_TABLET | Freq: Every day | ORAL | 0 refills | 30.00000 days | Status: CN
Start: 2024-11-20 — End: 2024-12-20

## 2024-11-20 NOTE — Progress Notes (Signed)
 I interviewed and examined the patient with the resident. I actively participated in the key portions of the service. I reviewed the resident's note. I agree with the findings and plan as documented.       Joselle Deeds A Damere Brandenburg, MD

## 2024-11-20 NOTE — Telephone Encounter (Deleted)
 Left message on [phone] regarding missed appointment with [facility]. Advised to call clinic number [phone]) back to discuss concerns and reschedule at earliest convenience.

## 2024-11-25 DIAGNOSIS — F259 Schizoaffective disorder, unspecified: Principal | ICD-10-CM

## 2024-11-25 MED ORDER — FLUPHENAZINE DECANOATE 25 MG/ML INJECTION SOLUTION
INTRAMUSCULAR | 11 refills | 56.00000 days | Status: CP
Start: 2024-11-25 — End: ?
  Filled 2024-12-17: qty 5, 56d supply, fill #0

## 2024-11-25 MED ADMIN — fluPHENAZine decanoate (PROLIXIN) injection 50 mg: 50 mg | INTRAMUSCULAR | @ 17:00:00 | Stop: 2024-11-25

## 2024-11-25 NOTE — Telephone Encounter (Signed)
 Hi Dr. Elige,    There is a form in your box requiring your signature.    Thank you,  Massie

## 2024-11-25 NOTE — Progress Notes (Signed)
 Administered long-acting injectable today. See MAR for additional documentation. Sharyl Nimrod, CMA

## 2024-11-25 NOTE — Addendum Note (Signed)
 Addended by: OMIE BOUCHARD M on: 11/25/2024 12:15 PM     Modules accepted: Orders

## 2024-11-30 NOTE — Telephone Encounter (Signed)
 Hi Dr. Elige,    There is a form in your box requiring your signature.    Thank you,  Massie

## 2024-12-01 NOTE — Telephone Encounter (Signed)
 Attempted to call [facility] back to provide the status update for all 4 orders. All orders have been printed and placed in the providers box requiring signatures.

## 2024-12-01 NOTE — Telephone Encounter (Signed)
 Copied from CRM #[id number]. Topic: Referral - Referral Status  >> Dec 01, 2024 10:52 AM Jolaine Finder wrote:  Status Needed: They are inquiring about the status of the referral/order request for Home health orders:    [id number]    [id number]    [id number]    [id number]      Facility Request: [facility]. Coverage: yes, coverage is accurate on file.   Sonya  preferred contact: [phone] Urgent callback turnaround time: within 24 business hours. Programmer, Systems Notified)

## 2024-12-09 NOTE — Progress Notes (Signed)
 [facility] FAMILY MEDICINE [place] POPULATION HEALTH  Care Management Progress Note    Date: 11/04/2024  Outcome:  Phone outreach completed    Is this patient in [place] ? Yes    Purpose of contact:           Reply received from [facility] representative - they have two authorized retailers in the area:    [employer] and [employer]. [phone]     [employer]   [employer]. [phone]    Update 11/11/24:  Contacted [employer] to learn more about the process. Per representative, they don't have any currently in stock and states it would require a PT evaluation. States they would likely not   [employer] and [employer]. Spoke with research scientist (medical) at [employer] branch, she is not aware of this piece of equipment but will refer to [facility] and [place] branches. VM left.    Next steps: update family on progress.     Additional Information/Plan:  Patient provided my direct contact information and encouraged to contact me should additional needs arise.    Lauraine KANDICE Dural, LCSW  Population Health  [facility] FAMILY MEDICINE [place]

## 2024-12-09 NOTE — Progress Notes (Signed)
 [facility] Clinic Administered Medication Refill Coordination Note      NAME:Tammy Braun DOB: November 19, 1941      Medication: fluPHENAZine  decanoate 25 mg/mL injection (PROLIXIN )    Day Supply: 56 days      SHIPPING      Next delivery from [facility] [phone]) to [facility] clinic for Tammy Braun is scheduled for 12/18/2024.    Clinic contact: [name]    Patient's next nurse visit for administration: 12/23/2024.    We will follow up with clinic monthly for standard refill processing and delivery.      [name]  [facility] Special[facility] Home Delivery Pharmacy Specialty Technician

## 2024-12-10 NOTE — Telephone Encounter (Signed)
 Form faxed to [phone]

## 2024-12-11 NOTE — Telephone Encounter (Signed)
 All forms have been faxed back to [facility]

## 2024-12-12 MED FILL — ELIQUIS 2.5 MG TABLET: ORAL | 30 days supply | Qty: 60 | Fill #1

## 2024-12-12 MED FILL — SENNA 8.6 MG TABLET: ORAL | 30 days supply | Qty: 60 | Fill #1

## 2024-12-15 NOTE — Telephone Encounter (Signed)
 Telephone call

## 2024-12-16 NOTE — Progress Notes (Signed)
 [facility] FAMILY MEDICINE [place] POPULATION HEALTH  Care Management Progress Note    Date: 12/16/2024  Outcome:  Phone outreach completed    Is this patient in [place] ? Yes    Purpose of contact:           SW called patient's daughter in regards to upcoming PCP appointment. SW added several concerns daughter had to the appointment notes.     SW reached out to family's care manager, Randall Bowman, with [facility] to discuss proper documentation needed for more aide hours. Unable to reach them, left a VM.     Provider/Care Partner(s) to follow up on:   See appt notes    Additional Information/Plan:  Patient provided my direct contact information and encouraged to contact me should additional needs arise.    Time Spent Per Day:  Chart review was completed prior to outreach attempt.   12/16/2024: 15      Harlene JAYSON Centers, LCSW  Population Health  [facility] FAMILY MEDICINE [place]

## 2024-12-17 ENCOUNTER — Inpatient Hospital Stay: Admit: 2024-12-17 | Discharge: 2024-12-17 | Payer: MEDICARE

## 2024-12-17 MED ORDER — DOXYCYCLINE HYCLATE 100 MG TABLET
0.00000 days
Start: 2024-12-17 — End: ?

## 2024-12-17 NOTE — Progress Notes (Signed)
 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)       Cardiovascular and Mediastinum    Carotid arterial disease (CMS-HCC) < 40% bilaterally 2021    Primary hypertension    Pulmonary hypertension    (CMS-HCC)    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    Acute on chronic diastolic CHF (congestive heart failure) (CMS-HCC)    Paroxysmal atrial fibrillation    (CMS-HCC)       Musculoskeletal and Integument    Osteoarthrosis neck    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    Disorder of left rotator cuff    Relevant Orders    XR Shoulder 2 Views Left    Arthritis of neck    Relevant Orders    XR Cervical Spine AP And Lateral       Genitourinary    Chronic renal disease, stage 2, mildly decreased glomerular filtration rate (GFR) between 60-89 mL/min/1.73 square meter       Other    Schizoaffective disorder    (CMS-HCC)     Other Visit Diagnoses         Chronic pain of left knee    -  Primary    Relevant Orders    XR Knee 1 or 2 Views Left              Updated problem list annotations  Database review and update  Med review and update  Lab review and update  Counseling provided and patient education    SUBJECTIVE:    Tammy Braun is a 83 y.o. female that presents to clinic today regarding the following issues:         reports that she has quit smoking. Her smoking use included cigarettes. She has never used smokeless tobacco.    The following portions of the patient's history were reviewed and updated as appropriate: allergies, current medications, past family history, past medical history, past social history, past surgical history and problem list.    Review of systems for 10 organ systems conducted and no significant positives      OBJECTIVE:    Vital signs have been reviewed:   Vitals:    12/17/24 1442   BP:    Pulse: 69   Temp:    SpO2:

## 2024-12-21 NOTE — Telephone Encounter (Signed)
 Copied from CRM #[id number]. Topic: Access To Clinicians - Clinical Question/Test Result  >> Dec 21, 2024  7:22 AM German G wrote:  Test Results: The patient has requested to be contacted regarding their the results for XRAY of the  12/17/24. Preferred Contact: Cell Phone Telephone Information:  Mobile          [phone]   Urgent callback turnaround time: within 24 business hours. Programmer, Systems Notified)

## 2024-12-22 MED ORDER — DOXYCYCLINE HYCLATE 100 MG TABLET
ORAL_TABLET | Freq: Every day | ORAL | 1 refills | 10.00000 days | Status: CP
Start: 2024-12-22 — End: ?

## 2024-12-22 MED ORDER — DICLOFENAC 1 % TOPICAL GEL
Freq: Four times a day (QID) | TOPICAL | 1 refills | 13.00000 days | Status: CP
Start: 2024-12-22 — End: 2025-12-22

## 2024-12-22 MED ADMIN — ropivacaine (NAROPIN) 5 mg/mL (0.5 %) injection 3 mL: 3 mL | INTRA_ARTICULAR | @ 20:00:00 | Stop: 2024-12-22

## 2024-12-22 MED ADMIN — triamcinolone acetonide (KENALOG-40) injection 20 mg: 20 mg | INTRA_ARTICULAR | @ 20:00:00 | Stop: 2024-12-22

## 2024-12-22 MED ADMIN — dexAMETHasone (DECADRON) 4 mg/mL injection 2 mg: 2 mg | INTRA_ARTICULAR | @ 20:00:00 | Stop: 2024-12-22

## 2024-12-22 NOTE — Progress Notes (Signed)
 Chief Complaint   Patient presents with    Follow-up     Neck       ASSESSMENT/PLAN:    Problem List Items Addressed This Visit    None  Visit Diagnoses         Arthritis of left knee    -  Primary    Relevant Medications    dexAMETHasone (DECADRON) 4 mg/mL injection 2 mg (Completed)    ropivacaine (NAROPIN) 5 mg/mL (0.5 %) injection 3 mL (Completed)    triamcinolone acetonide (KENALOG-40) injection 20 mg (Completed)            BP okay  May use the voltaren  gel on her shoulder  Follow up two weeks for injection right knee  Updated problem list annotations  Database review and update  Med review and update  Lab review and update  Counseling provided and patient education    SUBJECTIVE:    Tammy Braun is a 83 y.o. female that presents to clinic today regarding the following issues:    Here for left knee injection  Also worris about BP  Wants me to check     reports that she has quit smoking. Her smoking use included cigarettes. She has never used smokeless tobacco.    The following portions of the patient's history were reviewed and updated as appropriate: allergies, current medications, past family history, past medical history, past social history, past surgical history and problem list.    Review of systems for 10 organ systems conducted and no significant positives      OBJECTIVE:    Vital signs have been reviewed:   Vitals:    12/22/24 1513   BP: 142/70   Pulse:    Temp:      CPT 20610  Kenalog J3301, quantity 2 (20mg ) - 1ml of 20mg /ml strength  Dexamethasone J1100, quantity 2 (2mg ) - 1 ml of 2mg /ml strength  bupivacaine 0.5% 6 ml G9334     Procedure: left knee injection  Indication: lefy knee pain secondary to osteoarthritis     Consent   After discussing the various treatment options for the condition, It was agreed that a corticosteroid injection would be the next step in treatment. The nature of and the indications for a corticosteroid and / or local anaesthetic injection were reviewed in detail with the patient today. The inherent risks of injection including infection, bleeding, allergic reaction, increased pain, incomplete relief or temporary relief of symptoms, alterations of blood glucose levels requiring careful monitoring and treatment as indicated, tendon, ligament or articular cartilage rupture or degeneration, nerve injury, skin depigmentation, and/or fatty atrophy were discussed.   Procedure   After the risks and benefits of the procedure were explained,verbal consent was given, and a procedural time-out was performed. The knee joint was palpated and the correct anatomical landmarks were were used to identify the knee joint. The site for the injection from the lateral approach was properly marked and prepped with Chlorhexadine solution. The injection site was anesthetized with ethyl chloride. The knee joint was injected with 20 milligrams of Kenalog, 2 milligrams of Dexamethasone, 3 cc of 0.5% Ropivicaine using a sterile technique and a 22 gauge 1.5 inch needle. During the injection, there was unrestricted flow and care was taken not to inject corticosteroid into the skin or subcutaneous tissues. There were no complications during the procedure.     Post procedure a sterile band-aide was applied. Post-injection instructions were given regarding post-procedure care, when to follow up in clinic and  what to expect from the procedure. The patient tolerated the injection well and was discharged without complication.

## 2024-12-23 MED ADMIN — fluPHENAZine decanoate (PROLIXIN) injection 50 mg: 50 mg | INTRAMUSCULAR | @ 16:00:00 | Stop: 2024-12-23

## 2024-12-23 NOTE — Progress Notes (Signed)
 Prolixin dec 50 mg administered via intramuscular injection to patient's right deltoid. Patient tolerated injection well. Next injection scheduled for 4 weeks.

## 2025-01-11 NOTE — Telephone Encounter (Signed)
 CAM order for prolixin  50mg  IM placed

## 2025-01-12 DIAGNOSIS — G603 Idiopathic progressive neuropathy: Principal | ICD-10-CM

## 2025-01-12 DIAGNOSIS — I10 Essential (primary) hypertension: Principal | ICD-10-CM

## 2025-01-12 DIAGNOSIS — I480 Paroxysmal atrial fibrillation: Principal | ICD-10-CM

## 2025-01-12 DIAGNOSIS — I50812 Chronic right heart failure: Principal | ICD-10-CM

## 2025-01-12 DIAGNOSIS — I5033 Acute on chronic diastolic (congestive) heart failure: Principal | ICD-10-CM

## 2025-01-12 NOTE — Assessment & Plan Note (Signed)
 Chronic atrial fibrillation, rate controlled.  - Daughter feels that patient does not feel well when taking Diltiazem  120 mg once daily. Offered to change medication to Diltiazem  90 mg BID; however, patient would like to hold until cardiology appointment 01/28/2025.

## 2025-01-12 NOTE — Progress Notes (Signed)
 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      History of Present Illness          Physical Exam:  Vitals Reviewed:  There were no vitals taken for this visit.   Weight:   Wt Readings from Last 3 Encounters:   12/22/24 53 kg (116 lb 12.8 oz)   12/17/24 52.2 kg (115 lb)   11/10/24 53.1 kg (117 lb)         General: well-nourished, no acute distress, pleasant.   Head: normocephalic, atraumatic  Eyes: sclera clear, conjunctiva normal  Neck: supple, full ROM  Heart: RRR, no M/R/G.   Lungs: CTAB, no wheezes.  Musculoskeletal: no clubbing, cyanosis, or edema.   Skin: warm, well perfused, no rashes.   Psychiatry: A&O to person, place, and time. Mood appropriately reactive.  Thought process linear and goal directed.  Neurological: Normal gait.
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
## 2025-01-12 NOTE — Assessment & Plan Note (Signed)
 Continue gabapentin  100 mg every 8 hours prn and OTC topical analgesics for pain management.

## 2025-01-12 NOTE — Assessment & Plan Note (Signed)
 Initially very elevated, improved with recheck. Discussed importance of home monitoring. Will hold on additional medications and plan for recheck at cardiology appointment 01/27/2025.

## 2025-01-12 NOTE — Assessment & Plan Note (Signed)
 Complains of swelling in legs causing pain. Taking Lasix  20 mg once daily. Can discuss potentially increasing this medication with her PCP at follow up in 1 month.

## 2025-01-13 NOTE — Progress Notes (Signed)
 [facility] Home Delivery Pharmacy Clinic Administered Medication Refill Coordination Note      NAME:Tammy Braun DOB: July 23, 1941      Medication: fluPHENAZine  decanoate 25 mg/mL injection (PROLIXIN )    Day Supply: 56 days      SHIPPING      Next delivery from [facility] [phone]) to [facility] clinic for Tammy Braun is scheduled for 01/20/2025.    Clinic contact: [name]    Patient's next nurse visit for administration: 01/21/2025.    We will follow up with clinic monthly for standard refill processing and delivery.      Tammy Braun  [facility] Special[facility] and Home Delivery Pharmacy Specialty Technician

## 2025-01-17 NOTE — Telephone Encounter (Signed)
 This encounter was created in error - please disregard.

## 2025-01-17 NOTE — Progress Notes (Signed)
 This encounter was created in error - please disregard.

## 2025-01-18 MED FILL — FLUPHENAZINE DECANOATE 25 MG/ML INJECTION SOLUTION: INTRAMUSCULAR | 28 days supply | Qty: 5 | Fill #1

## 2025-01-18 NOTE — Patient Instructions (Addendum)
 It was a pleasure seeing you today! Please see the attached list for therapists in [facility] who take your insurance.    [facility]  [place], [place] [address]  [phone]   [email or web address]  Services: Psychotherapy, mental illness, play therapy, and peer support    [facility]  [address]  [place], [place] [address]  [phone]   [email or web address]  Services: Depression and Anxiety, Trauma and PTSD, and [address] [facility] [place], [place] [address]  [phone]   [email or web address]  Services: Individual therapy with adults and adolescents, depression, stress management, anxiety, couples and marital therapy     [facility]  [address]  [place], [place] [address]  [phone]   [email or web address]  Services: Individual psychotherapy, counseling, marriage counseling, family therapy, and group therapy     [facility]  [address]  [place], [place] [address]  [phone] x2   [email or web address]  Services: Depression, Anxiety, ADHD, Substance Abuse, Bipolar disorder, Psychotherapy, Spravato and Ketamine Infusion Therapy    [facility]  [address]  [place]  [place], [place] [address]   [phone]   [email or web address]    [facility] [place], [place] [address]  [phone] and [address] [facility]- Online only  [place], [place] [address]  [phone]   [email or web address]    Follow-up instructions:  - Please continue taking your medications as prescribed for your mental health.   - Do not make changes to your medications, including taking more or less than prescribed, unless under the supervision of your physician. Be aware that some medications may make you feel worse if abruptly stopped  - Please refrain from using illicit substances, as these can affect your mood and could cause anxiety or other concerning symptoms.   - Seek further medical care for any increase in symptoms or new symptoms such as thoughts of wanting to hurt yourself or hurt others.     Contact info:  Life-threatening emergencies: Call 911, the 988 suicide and crisis lifeline, or go to the nearest ER for medical or psychiatric attention.           Issues that need urgent attention but are not life threatening: Call the clinic outpatient front desk for assistance:    - [phone] for [facility] adult psychiatry clinics located at [address] [facility]  - [phone] for [facility] child and adolescent psychiatry clinics located at [address]  - [phone] for [facility] and adult psychiatry clinics located at [address] [facility]    Non-urgent routine concerns and questions: Send a message through [facility] or call our clinic front desk.    Refill requests: Check with your pharmacy to initiate refill requests.    Regarding appointments:  - If you need to cancel your appointment, we ask that you call your clinic at least 24 hours before your scheduled appointment at the numbers listed above.  - If for any reason you arrive 15 minutes later than your scheduled appointment time, you may not be seen and your visit may be rescheduled.  - Please remember that we will not automatically reschedule missed appointments.  - If you miss two (2) appointments without letting us  know in advance, you will likely be referred to a provider in your community.  - We will do our best to  be on time. Sometimes an emergency will arise that might cause your clinician to be late. We will try to inform you of this when you check in for your appointment. If you wait more than 15 minutes past your appointment time without such notice, please speak with the front desk staff.    In the event of bad weather, the clinic staff will attempt to contact you, should your appointment need to be rescheduled. Additionally, you can call the Patient Weather Line [phone] for system-wide clinic status    For more information and reminders regarding clinic policies (these were provided when you were admitted to the clinic), please ask the front desk.

## 2025-01-18 NOTE — Telephone Encounter (Signed)
 Upcoming Appt:  Future Appointments   Date Time Provider Department Center   01/21/2025 10:30 AM [facility] NURSE OPT[facility]   01/22/2025 10:30 AM Omie Duwaine HERO, DO PSYSTEPGRNB [facility]   01/27/2025 11:15 AM Barnet, Donnice Righter, MD UNCHRTVASET [facility]   02/11/2025  1:40 PM Elige [name], MD [facility]   03/04/2025  3:00 PM Mazzola Poli de Figueiredo, Sergio, MD UNCHGACSU [facility]   03/16/2025  1:00 PM IC US  RM 1 IUSRLGH [place] - IC   03/16/2025  2:30 PM Abigail Ronal Speed, ANP UNCUROSVCET [facility]   04/28/2025 11:30 AM Claudene Cesar HERO, MD UNCPULSPCLET [facility]   06/02/2025  7:50 AM Creed, Camellia Riis, DO UNCHNEUMM [facility]       Disposition:  Home Care - Information or Advice Only Call    Triage documentation reviewed and Disposition present?  yes    Encounter Reason for Disposition:    General information question, no triage required and triager able to answer question  Advised that no appt seen scheduled. Advised to follow up with clinic when goes to her next appt on 01/21/25.      Is this a pediatric patient?   No     Any recent, relevant visit?   No    Any relevant medical history?   No    Any interventions?   No      [facility] Eligibility    Patient is eligible for [facility] (according to most recent zip code and insurance information)?  No    Patient is [facility] eligible and appropriate for referral: No     Encounter Initial Assessment:  1. REASON FOR CALL: What is the main reason for your call? or How can I best help you?      Wants to know next appt with cancer Dr  2. SYMPTOMS : Do you have any symptoms?       None  3. OTHER QUESTIONS: Do you have any other questions?      no      Encounter Protocols Used:  Information Only Call - No Triage-A-AH

## 2025-01-18 NOTE — Progress Notes (Addendum)
 [facility]  Psychiatry   Established Patient E&M Service - Outpatient       Assessment:    Sulay Brymer presents for follow-up evaluation. She was last seen 11/21.    Today 01/22/2025, patient reports ongoing medical issues which has caused some worsening of mood symptoms. Reports still having ruminative thoughts about negative past experiences with deceased husband. PHQ-9 of 7 today. She has stopped taking duloxetine  and not interested in restarting at this time, but discussed revisiting antidepressant should symptoms worsen. Unfortunately has not heard from therapist on referral progress. Will send list of local therapists in [place] that accept Medicare. Psychotic symptoms are largely stable - still has occasional AH of husband's voice but she is not particularly bothered by them. Re-discussed adding oral prolixin  as needed, however she declines due to c/f side effects/polypharmacy. AIMS today of 2, lowering concern for TD. Metabolic labs due in April. Patient prefers early follow up - will plan for ~1 month.      Plan:   [ ]  maintain current medication regimen  [ ]  follow up on therapy referral   [ ]  send list of therapists in [place]    Identifying Information:  Dharma Pare is a 84 y.o. female with a history of schizoaffective disorder. First presented to [facility] September 2021. She has been stable on prolixin  50 mg q30 days for the past 30 years. Per PCP, patient has had worsening of depressive symptoms, though patient did not endorse. Reports auditory hallucinations and denies any CAH. Reports some anxiety related to impatience waiting on others, but finds this manageable. Patient is not interested in any new medications, but could consider increasing the frequency of prolixin  administration if needed. March 2022, augmenting with mirtazapine  for low mood and sleep. Could also consider augmenting with SRNI or wellbutrin for depression. In September 2025, her orthopedic provider started her on cymbalta  20mg  daily for shoulder pain which she described as also helping with low mood.    Risk Assessment:   An assessment of suicide and violence risk factors was performed as part of this evaluation and is not significantly increased from the last visit.   While future psychiatric events cannot be accurately predicted, the patient does not currently require acute inpatient psychiatric care and does not currently meet [place]  involuntary commitment criteria.    Plan:    Problem: Schizoaffective disorder  Status of problem: chronic and stable  Interventions:   - Continue Prolixin  50 mg IM q28 days  - Patient self discontinued duloxetine  20mg  daily (prescribed by orthopedics, started Sep 2025); discussed restarting should symptoms worsen  - coordinate CM about therapy referrals; [facility] Medicare therapy list given    PHQ-9 PHQ-9 Total Score   01/22/2025  10:30 AM 7   09/25/2024  11:00 AM 15   06/26/2024  11:30 AM 8   01/31/2024   9:30 AM 5    04/19/2023  10:00 AM 12        Data saved with a previous flowsheet row definition        Problem: TUD  Status of problem:  chronic and stable  Interventions:  - Previously documented that pt quit without NRT in April 2025.   - Currently smoking 1 cigarette per day.     Problem: High risk medication monitoring   Status of problem:  up to date  Interventions:  - appropriate for yearly monitoring     Lab Results   Component Value Date  A1C 5.6 04/23/2024      Lab Results   Component Value Date    LDL 75 04/23/2024    LDL 88 11/02/2022    LDL 107 (H) 11/03/2021       BP Readings from Last 3 Encounters:   01/22/25 (!) 156/84   01/21/25 (!) 165/85   01/12/25 (!) 155/86       Psychotherapy provided:  No billable psychotherapy service provided.    Patient has been given this writer's contact information as well as the [facility] Psychiatry urgent line number. The patient has been instructed to call 911 for emergencies.    Patient was seen and plan of care was discussed with the Attending MD,Dr. Glenora, who agrees with the above statement and plan.    Subjective:    Patient seen individually.     Still having worry thoughts about dead husband, often at night.   Mood has awful today: sometimes good, sometimes bad.Having conflicts with daughter with whom she is living. Thinking a lot about past, which makes her sad. Getting arguments frequently about things at home.   Smoking 1 cigarette a day which makes her happy. Likes going to church, enjoys going weekly. Going shopping occasionally which is enjoyable.     Sleep has been okay, going to bed around 10:30p and sleeping until 5-6am. Every night having nightmares about her mom; often calling her mom's name. Mom telling her to do things in her dreams. Not necessarily traumatic memories from the past.    Says she thinks about death sometimes but no planning. Has never tried to harm herself in the past.   Eating three meals a day - rye, oatmeal, egg/sausage for breakfast; makes a sandwich or ravioli for lunch; eats collard greens, potatoes, and chicken for dinner. Has lost weight and isn't sure why. Lost about 20lbs since September. Seeing PCP regularly for follow up.     Doesn't want to take oral formulation of prolixin  2/2 c/f s/e. Happy with current med regimen and doesn't want changes.      Objective:    Mental Status Exam:   Appearance:    Appears stated age   Motor:   No abnormal movements   Speech/Language:    Normal rate, volume, tone, fluency and Impaired articulation   Mood:   Sometimes good, sometimes bad   Affect:   Calm and Euthymic   Thought process and Associations:   Logical, linear, clear, coherent, goal directed   Abnormal/psychotic thought content:     Endorses passive thoughts of death, denies active SI. Does not endorse HI. No obsessions, delusions, paranoia.   Perceptual disturbances:     Endorses auditory hallucinations at night that return around the three week mark after her injections     Visit was completed face to face.    I personally spent 56 minutes face-to-face and non-face-to-face in the care of this patient, which includes all pre, intra, and post visit time on the date of service.  All documented time was specific to the E/M visit and does not include any procedures that may have been performed.        Lashaunda Schild M Larrisha Babineau, DO

## 2025-01-19 NOTE — Telephone Encounter (Signed)
 Hi Dr. Elige,    There is a form in your box requiring your signature.    Thank you,  Massie

## 2025-01-21 MED ADMIN — fluPHENAZine decanoate (PROLIXIN) injection 50 mg: 50 mg | INTRAMUSCULAR | @ 17:00:00 | Stop: 2025-01-21

## 2025-01-21 NOTE — Progress Notes (Signed)
 Administered long-acting injectable today,. See MAR for additional documentation. Sharyl Nimrod, CMA

## 2025-01-22 DIAGNOSIS — F250 Schizoaffective disorder, bipolar type: Principal | ICD-10-CM

## 2025-01-22 DIAGNOSIS — E1142 Type 2 diabetes mellitus with diabetic polyneuropathy: Secondary | ICD-10-CM

## 2025-01-22 MED ORDER — GABAPENTIN 100 MG CAPSULE
ORAL_CAPSULE | Freq: Three times a day (TID) | ORAL | 0 refills | 30.00000 days | Status: CN | PRN
Start: 2025-01-22 — End: 2025-04-22

## 2025-01-22 NOTE — Progress Notes (Signed)
 Tammy Braun requests more gabapentin . States she's out.  Asked if she could take it.  I pended the request on her behalf.

## 2025-01-24 NOTE — Progress Notes (Signed)
 I saw and evaluated the patient, participating in the key portions of the service.  I reviewed the resident???s note.  I agree with the resident???s findings and plan. Augusto Garbe, MD

## 2025-01-25 NOTE — Progress Notes (Unsigned)
 DIVISION OF CARDIOLOGY   [employer]  , Tammy Braun        Date of Service: 01/27/2025    Assessment/Plan     1. Afib - PVCs - Found to be in afib w/ RVR during admission of December of 2024. Zio xt from 10/2024 showed no atrial fibrillation detected, frequent (19.1% burden) premature ventricular contractions (PVCs) without episodes of wide complex tachycardia, and 29 (short) episodes of supraventricular tachycardia were incidentally noted lasting up to 17 beats and not associated with symptomatic triggers. She will continue eliquis  2.5 mg BID.    2. Hypertension -her blood pressure today is ***. We will continue Lasix .  Advised patient to monitor BP at home and maintain a log.      3. Tobacco use disorder - currently smoking 1 pack a day, increased from 5 cigarettes a day ***.  Complete cessation encouraged.     4. Moderate Tricuspid Regurgitation -  We will continue to monitor. She was admitted with fluid hypervolemia in June of 2025. Continue lasix  20 mg.  - Echo from 03/11/24 showed hyperdynamic LV function with an estimated EF of 70%, LA dilation, RA dilation, moderately dilated RV with moderately reduced systolic function, and severe pulmonary hypertension with TR maximum velocity of 4.6 m/s and estimated PASP of 96 mmHg.    5. CAD - Chest CT from 06/06/23 notes heavy calcified atherosclerotic disease within the coronary arterial distribution. Her nuclear stress test from 2021 showed no evidence of ischemia. She currently is asymptomatic, denying chest pain ***. LDL of 75 on 04/23/24. I urged her to quit smoking, as this will continue to worsen her coronary disease.     6. DM2 - Recent A1c of 5.6% on 04/23/24.     7. Prolonged QT Interval ISO Hyperkalemia- During most recent recent admission, EKG did not demonstrate widened QRS or peaked T waves but did demonstrate QT prolongation iso hyperkalemia after recently starting spironolactone . She received IV lasix  and K normalized the next morning. Her QT interval remained prolonged on EKG and telemetry monitoring despite normalizaiton of electrolytes. QT prolonging meds held (anti-emetics) and QT still did not improve after holding for > 24 hours.    8. Chronic PE - Filling defect in L anterior segmental artery on 03/04/24 CTA chest. Heparin  gtt converted to therapeutic lovenox  and then Eliquis . Chronic segmental RLL PE noted on CTA, appeared to be new. L PE resolved. Was previously on eliquis , however pt discontinued due to concern about falls and risk of bleeding. Received heparin  gtt for 24 hrs given c/f SBO, transitioned back to Eliquis . Eliquis  10mg  BIDx7 days transitioned to 5mg  BID on 06/20/24. She will continue eliquis  2.5 mg BID.    Return to clinic:    No follow-ups on file.      Subjective:   ERE:Hnoiduzpw, Tammy Mae, MD     Chief complaint: f/u of PVCs    History of Present Illness:  Tammy Braun is a 84 y.o. female with a history of bladder cancer s/p bladder removal w/ ileal-conduit with urostomy, emphysema, hypertension, tobacco abuse and hiatal hernia who presents for routine f/up.      Tammy Braun established care after hospitalization at [facility]. She was admitted for significant hyponatremia in the setting of hydrochlorothiazide  and poor p.o. intake. Was noted to have very frequent ectopy. Nuclear stress test negative for ischemia. Echo w/ normal LVSF, severe PH, mod-sev TR. Ziopatch results during hospital stay showed 19% PVCs that were symptomatic (she felt dizzy  and lightheaded during that time). She had recurrence of episodes of lightheadedness/dizziness and EKG 09/21/20 showed recurrence of PVCs in bigeminy. Her daughter felt that it was due to patient taking Goody powders again for HA and smoking. She states that she took these away from her after hospitalization and thinks that made the PVCs initially improve.     Tammy Braun returns for follow-up today in clinic. She has had several hospitalizations and ED presentations since her last clinic visit. She was admitted in December of 2024 to [facility] for weakness and cough with brownish colored sputum production, found to have severe sepsis with WBC 27.9, heart rate up to 136, RR 25. Lactic acid 2.8 --> 2.3. Likely due to combination of CAP and UTI. Patient was initially hypotensive with blood pressure 77/66, which improved return 138/81 after giving 3 L of LR bolus in ED. Trop elevation 2/2 demand ischemia  NSTEMI, ruled out. New onset atrial fibrillation w RVR at that time. She was admitted on 02/26/24 for projectile vomiting and abdominal pain found to have SBO with transition point at ileal conduit s/p ex-lap, reduction of parastomal hernia, small bowel resection with primary anastomosis, lysis of adhesions, abdomen left open with ABThera in place. Taken to the OR on 02/28/24 for ex-lap, reduction of parastomal hernia, small bowel resection with lysis of adhesions. Abthera placed and left open. Transferred to [facility] 2/28 for worsening hypoxic respiratory failure requiring intubation. Long and complicated stay in the [facility] in the post operative period including: failed extubation x2, klebsiella pna resistant to zosyn , acute respiratory failure, recurrent SBO, feeding intolerance, and aspiration. Returned to OR 03/01/24 for attempt closure, unable to close due to desaturations while attempting to close. Returned to OR 03/03/24 for abdominal closure. Filling defect in L anterior segmental artery on 3/5 CTA chest. Heparin  gtt converted to therapeutic lovenox  and then Eliquis . She presented to the ED on 06/03/24 for recorded SBP to 210s. While in ED, patient had BEs to the 170s/80s. EKG normal sinus rhythm with occasional PVCs. CBC, CMP, and Mg within normal limits. She was admitted from the ED on 06/13/24 for emesis, DOE, and orthopnea. Surgery consulted given pt s/p small bowel resection for SBO in February, agree with CT abdomen pelvis w/oral contrast which showed c/f early SBO vs ileus. Work up c/w dyspneas 2/2 RVF and fluid hypervolemia, PE, and PNA. For RVF diuresed with IV lasix  and she developed an AKI. Chronic segmental RLL PE noted on CTA, appeared to be new. L PE resolved. Was previously on eliquis , however pt discontinued due to concern about falls and risk of bleeding. Received heparin  gtt for 24 hrs given c/f SBO, however now tolerating PO, transitioned back to Eliquis . Eliquis  10mg  BIDx7 days transitioned to 5mg  BID on 6/21. She was admitted from the ED on 10/12/24 for nausea and diarrhea, along with hyperkalemia (5.9) likely in the setting of recently starting spironolactone . Spironolactone  was held on admission. EKG did not demonstrate widened QRS or peaked T waves but did demonstrate QT prolongation. Patient received IV lasix  and K normalized the next morning. Her QT interval remained prolonged on EKG and telemetry monitoring despite normalizaiton of electrolytes. QT prolonging meds held (anti-emetics) and QT still did not improve after holding for > 24 hours.Cardiology was consulted and recommended initiation of nadolol  40 mg and placement of a ziopatch.     Today,    She lives at home and continues to smoke.    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)    Schizoaffective disorder    (CMS-HCC)    Onychomycosis of multiple toenails with type 2 diabetes mellitus and peripheral neuropathy (CMS-HCC)    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    At maximum risk for fall    Right hip pain    Tinea versicolor Disorder of left rotator cuff    Spinal stenosis of cervical region    Left shoulder pain    Articular cartilage disorder of shoulder region, left    Tinnitus    Bradycardia    Skin breakdown    Influenza vaccination declined    Chronic tension-type headache, not intractable    Osteoarthritis of cervical spine    Arthralgia of both ankles    Frequent PVCs    Carotid arterial disease (CMS-HCC) < 40% bilaterally 2021    Primary hypertension    Nonrheumatic tricuspid valve regurgitation    Pulmonary hypertension    (CMS-HCC)    Pyelitis    Age-related osteoporosis without current pathological fracture    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)    Arthritis of neck    History of bladder cancer    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    Antalgic gait    Atrial fibrillation, unspecified type    (CMS-HCC)    History of small bowel obstruction    Urinary incontinence    Vitamin D deficiency    Small bowel obstruction due to adhesions    Multifocal pneumonia    Subacute pulmonary embolism    (CMS-HCC)    History of MDR Pseudomonas aeruginosa infection    Chronic right-sided heart failure (CMS-HCC)    Acute on chronic diastolic CHF (congestive heart failure) (CMS-HCC)    CAD (coronary artery disease)    NSTEMI (non-ST elevated myocardial infarction) (CMS-HCC)    UTI (urinary tract infection)    Paroxysmal atrial fibrillation    (CMS-HCC)    Type II diabetes mellitus with renal manifestations (CMS-HCC)    CAP (community acquired pneumonia)    Pulmonary hypertension, unspecified    (CMS-HCC)    Kidney lesion    Gastroenteritis       Medications:  Current Outpatient Medications   Medication Sig Dispense Refill    acetaminophen  (TYLENOL ) 500 MG tablet Take 1 tablet (500 mg total) by mouth every six (6) hours. 30 tablet 0    albuterol  2.5 mg /3 mL (0.083 %) nebulizer solution Inhale 3 mL (2.5 mg total) by nebulization every four (4) hours as needed for wheezing. 90 mL 11    albuterol  HFA 90 mcg/actuation inhaler Inhale 2 puffs every four (4) hours as needed for wheezing. 18 g 10    apixaban  (ELIQUIS ) 2.5 mg Tab Take 1 tablet (2.5 mg total) by mouth two (2) times a day. 60 tablet 2    blood-glucose meter kit Disp. blood glucose meter kit preferred by patient's insurance. Check blood sugars as directed by provider. Dx: Diabetes, E11.9 1 each 1  calcium  carbonate-vitamin D3 (CALCIUM  600 + D,3,) 600 mg-10 mcg (400 unit) per tablet Take 1 tablet by mouth two (2) times a day. 180 tablet 2    diclofenac  sodium (VOLTAREN ) 1 % gel Apply 2 grams topically four (4) times a day. 100 g 1    doxycycline  (VIBRA -TABS) 100 MG tablet Take 1 tablet (100 mg total) by mouth daily. 10 tablet 1    fluPHENAZine  decanoate (PROLIXIN ) 25 mg/mL injection Inject 2 mL (50 mg total) into the muscle every twenty-eight (28) days. 5 mL 11    furosemide  (LASIX ) 20 MG tablet TAKE 1 TABLET(20 MG) BY MOUTH DAILY 30 tablet 5    gabapentin  (NEURONTIN ) 100 MG capsule Take 1 capsule (100 mg total) by mouth Three (3) times a day as needed (pain). 90 capsule 0    inhalational spacing device (E-Z SPACER) Spcr 1 each by Miscellaneous route every four (4) hours as needed. 1 each 0    MEDICAL SUPPLY ITEM Entreal formula nutritionally supplement complete caloric density 3 application 3    MEDICAL SUPPLY ITEM Compression stockings knee high 2 each 0    MEDICAL SUPPLY ITEM Group 2 Power Wheelchair, Research Scientist (life Sciences) , Power Elevating Leg Rests, and Tour Manager. 1 each 0    miscellaneous medical supply Misc 1 each by Miscellaneous route four (4) times a day. 100 each 0    polyethylene glycol (GLYCOLAX ) 17 gram/dose powder Mix 1 capful (17 grams) in 4 to 8 ounces of liquid and drink by mouth daily. Use instructions from provider for further titration depending on bowel patterns. 510 g 2    SENNA 8.6 mg tablet Take 2 tablets by mouth nightly for 3 nights if no bowel movement in 3 days. Follow further titration instructions from MD. 60 tablet 2    underpads 2.6 X 2.9 feet Pads Use 1 each as directed two (2) times a day. 100 each 3     No current facility-administered medications for this visit.       Allergies  Allergies   Allergen Reactions    Lisinopril Anaphylaxis and Swelling    Losartan  Dizziness     Other Reaction(s): Dizziness    Hctz [Hydrochlorothiazide ]      SIADH       Social History:   Social History     Tobacco Use    Smoking status: Former     Current packs/day: 0.50     Types: Cigarettes    Smokeless tobacco: Never    Tobacco comments:     10cpd, quit about 2 months ago (April 2025)   Vaping Use    Vaping status: Never Used   Substance Use Topics    Alcohol use: No     Alcohol/week: 0.0 standard drinks of alcohol    Drug use: No       Family History:  Family History   Problem Relation Age of Onset    Breast cancer Daughter 87    Diabetes Mother     Glaucoma Father     Colon cancer Neg Hx     Ovarian cancer Neg Hx     Endometrial cancer Neg Hx     Anesthesia problems Neg Hx     Bleeding Disorder Neg Hx        ROS- 12 system review is negative other than what is specified in the History of Present Illness.      Objective:   Physical Exam  There were no vitals filed for this visit.  Wt Readings from Last 3 Encounters:   01/12/25 52.7 kg (116 lb 3.2 oz)   12/22/24 53 kg (116 lb 12.8 oz)   12/17/24 52.2 kg (115 lb)     General-  Patient is chronically ill-appearing in no acute distress  Neurologic- Alert and oriented X3.  Cranial nerve II-XII grossly intact.  HEENT-  Normocephalic atraumatic head.  No scleral icterus.    Neck- Supple, no carotid bruis, 11 cm JVD  Lungs- clear to auscultation, no wheezes, rhonchi, or rhales.  Heart-  Bradycardic with HR 40, regular rhythm with occasional PVC's,  soft ejection murmur at the llsb  Abdomen- Soft, nontender, no organomegally.  Extremities-  No clubbing or cyanosis. No LEE  Pulses- 2+pulses in radial and dorsalis pedis bilaterally.  Psych- Normal mood, appropriate.      Laboratory data:      Component Value Date/Time    PROBNP 4,504.0 (H) 10/12/2024 1340       Lab Results   Component Value Date    WBC 7.8 10/14/2024    HGB 11.7 10/14/2024    HCT 35.6 10/14/2024    PLT 274 10/14/2024       Lab Results   Component Value Date    NA 136 10/15/2024    K 4.5 10/15/2024    CL 99 10/15/2024    CO2 23.6 10/15/2024    BUN 13 10/15/2024    CREATININE 0.86 10/15/2024    GLU 141 10/15/2024    CALCIUM  9.3 10/15/2024    MG 2.0 10/15/2024    PHOS 3.1 04/13/2024       Lab Results   Component Value Date    TSH 4.710 10/14/2024    ALBUMIN  3.5 10/12/2024    ALT 8 (L) 10/12/2024    AST 22 10/12/2024    INR 1.06 03/04/2024       Lab Results   Component Value Date    LDL 75 04/23/2024    HDL 53 04/23/2024       Most Recent Imaging:     LHC  No prior     ZIO XT  11/03/24  No atrial fibrillation detected  Frequent (19.1% burden) premature ventricular contractions (PVCs) without episodes of wide complex tachycardia  29 (short) episodes of supraventricular tachycardia were incidentally noted lasting up to 17 beats and not associated with symptomatic triggers  34 total symptomatic triggers recorded correlating to sinus rhythm with ectopic beats (PVCs)    Echo  03/11/24    1. The left ventricular systolic function is hyperdynamic, LVEF is visually  estimated at 70%.    2. The left atrium is dilated in size.    3. The right ventricle is moderately dilated in size, with moderately  reduced systolic function.    4. There is severe pulmonary hypertension.    5. TR maximum velocity: 4.6 m/s  Estimated PASP: 96 mmHg.    6. The right atrium is dilated in size.  03/03/24   1. The left ventricular systolic function is normal, LVEF is visually  estimated at 65-70%.    2. Mitral annular calcification is present (moderate).    3. The right ventricle is moderately dilated in size, with moderately  reduced systolic function.    4. Possible McConnell sign is noted- see details below.    5. There is massive tricuspid regurgitation.    6. There is mild-moderate pulmonary hypertension.    7. TR maximum velocity: 3.2 m/s  Estimated PASP: 49 mmHg.    8. The  right atrium is moderately dilated in size.  03/01/24   1. The left ventricle is normal in size with normal wall thickness.    2. The left ventricular systolic function is normal, LVEF is visually  estimated at > 55%.    3. The right ventricle is mildly to moderately dilated in size, with mildly  reduced systolic function.    4. There is severe tricuspid regurgitation.    5. There is at least moderate pulmonary hypertension, peak velocity may  underestimate RVSP due to severe TR.    6. The right atrium is moderately to severely dilated in size.    CT Chest    06/13/24  1. Subacute or chronic occlusive segmental right lower lobe pulmonary embolus. Poor opacification of distal pulmonary artery branches in the left lower lobe likely reflects poor right heart output, though additional small distal emboli are not excluded.  2. Dilated right atrium. Enlarged central pulmonary arteries, reflecting known pulmonary hypertension.  3. Upper lung-predominant emphysema. Multifocal pulmonary infectious opacities, more extensive in the left lung.  4. Large posterior diaphragmatic hernia.  06/06/23  1. As evident on prior imaging, there is extensive volume in the lung bases associated with mass effect from herniation of a large amount of abdominal contents into the caudal aspect of the mediastinum. However, this study demonstrates new complete lack of aeration of the lower lobe of the left lung which likely combination of atelectasis and consolidation. Associated extensive debris within the segmental airways may relate to infection, retained mucus, or aspiration. I favor the latter. No potentially associated obstructing mass lesion is identified. However, consider CT imaging surveillance in 2 to 3 months to ensure improvement.  2. Notable chronic findings include:  - Calcified atherosclerotic disease within the coronary arterial distribution (heavy)  - Advanced destructive centrilobular and substantial paraseptal emphysema, similar to prior.  -Prominent pulmonary pulmonary trunk (3. 7 cm), which may reflect some element of pulmonary arterial hypertension  06/22/20  -- No evidence of intrathoracic mass as clinically questioned.  -- Dilated main pulmonary trunk measuring up to 3.9 cm which is nonspecific, however can be seen in the setting of pulmonary hypertension.  -- Grossly unchanged large hiatal hernia with associated adjacent atelectasis.  -- Additional chronic and incidental findings as described above.     NM Spect Mult / ETT 06/29/20  - No evidence for significant ischemia or scar is noted.  - Post stress: Global systolic function is normal. The ejection fraction was greater than 65%.  - Coronary and thoracic aorta calcifications as well as dilated main pulmonary trunk noted on CT, better characterized on recent chest CT scan dated 06/22/2020  - Large hiatal hernia noted, better characterized on recent CT abdomen 06/22/2020     Normal pharmacological stress ECG, nuclear images will be dictated separately.  No significant ST segment changes or arrhythmias were noted during stress  No significant chest pain symptoms reported     PVL  Venous Duplex Lower Extremity Bilateral 03/02/24  Final Interpretation  Right  There is no evidence of DVT in the lower extremity.  Left  There is no evidence of DVT in the lower extremity.  Venous Duplex Upper Extremity Bilateral 03/02/24  Right  No evidence of DVT detected in the central veins. Evidence of acute superficial  venous obstruction in the basilic vein. This was a limited study.  Left  No evidence of DVT detected in the central veins or arm veins.  Carotid Duplex 12/06/20  Right Carotid: There is  evidence in the ICA of a less than 40% stenosis. Non-hemodynamically significant plaque noted in the CCA.  Left Carotid: There is evidence in the ICA of a less than 40% stenosis. Non-hemodynamically significant plaque noted in the CCA. Plaque noted in the ECA.  Vertebrals:  Both vertebral arteries were patent with antegrade flow.  Subclavians: Normal flow hemodynamics were seen in bilateral subclavian arteries.    ECG from 10/19/20 shows NSR, no PVCs      Dictated using Dragon software, please excuse typos.    Scribe Attestation:         This document serves as a record of the services and decisions performed by Donnice A. Barnet, MD on 01/27/2025. It was created on his behalf by Jantz Main M Novak Stgermaine, a trained medical scribe. The creation of this document is based on the provider's statements and observations that were conveyed to the medical scribe during the patient's encounter.     (The information in this document, created by the medical scribe for me, accurately reflects the services I personally performed and the decisions made by me. I have reviewed and approved this document for accuracy.)    I personally spent face-to-face and non-face-to-face in the care of this patient, which includes all pre, intra, and post visit time on the date of service.      Donnice FELIX Barnet, MD, MPH  Associate Professor of Medicine  [employer]  at [facility]  [email or web address]   -    Office [phone]

## 2025-01-28 NOTE — Progress Notes (Signed)
 Complex Case Management  SUMMARY NOTE    Care Management Assistant spoke with patient daughter and verified correct patient using two identifiers today to introduce the Complex Case Management program.     Discussed the following:  Program Services, Expectations of participation, and Verified Demographics    Program status: Interested    Discuss at next visit: Introduction to Complex Case Management    Scheduled for: 11:00 am  01/29/25 with Care Management Assistant    [facility] Management Assistant  [facility]  [address]  [place], [place] [address]  She/Her/Hers  P: [phone] F: [phone]  [email or web address]

## 2025-01-29 NOTE — Progress Notes (Signed)
 Complex Case Management  SUMMARY NOTE    Attempted to contact pt today at Cell number to introduce Complex Case Management services. No answer, unable to leave message; 2nd attempt, letter sent.    Discuss at next visit: Introduction to Complex Case Management    [facility] Management Assistant  [facility]  [address], [address]  [place], [place] [address]  She/Her/Hers  P: [phone] F: [phone]  [email or web address]

## 2025-02-02 NOTE — Progress Notes (Signed)
 Complex Case Management  SUMMARY NOTE    Attempted to contact pt today at Cell number to introduce Complex Case Management services. No answer, unable to leave message; 2nd attempt, letter sent.    Discuss at next visit: Introduction to Complex Case Management    [facility] Management Assistant  [facility]  [address], [address]  [place], [place] [address]  She/Her/Hers  P: [phone] F: [phone]  [email or web address]
